# Patient Record
Sex: Female | Born: 1940
Health system: Southern US, Community
[De-identification: ages and names within clinical notes are randomized; demographics above are authoritative.]

## PROBLEM LIST (undated history)

## (undated) ENCOUNTER — Emergency Department (HOSPITAL_COMMUNITY): Admission: EM | Payer: Medicare Other | Source: Home / Self Care

## (undated) DIAGNOSIS — G4733 Obstructive sleep apnea (adult) (pediatric): Secondary | ICD-10-CM

## (undated) DIAGNOSIS — M1990 Unspecified osteoarthritis, unspecified site: Secondary | ICD-10-CM

## (undated) DIAGNOSIS — N183 Chronic kidney disease, stage 3 (moderate): Secondary | ICD-10-CM

## (undated) DIAGNOSIS — D472 Monoclonal gammopathy: Secondary | ICD-10-CM

## (undated) DIAGNOSIS — R7303 Prediabetes: Secondary | ICD-10-CM

## (undated) DIAGNOSIS — R011 Cardiac murmur, unspecified: Secondary | ICD-10-CM

## (undated) DIAGNOSIS — K219 Gastro-esophageal reflux disease without esophagitis: Secondary | ICD-10-CM

## (undated) DIAGNOSIS — K31819 Angiodysplasia of stomach and duodenum without bleeding: Secondary | ICD-10-CM

## (undated) DIAGNOSIS — T7840XA Allergy, unspecified, initial encounter: Secondary | ICD-10-CM

## (undated) DIAGNOSIS — Z1231 Encounter for screening mammogram for malignant neoplasm of breast: Secondary | ICD-10-CM

## (undated) DIAGNOSIS — I4891 Unspecified atrial fibrillation: Secondary | ICD-10-CM

## (undated) DIAGNOSIS — M4802 Spinal stenosis, cervical region: Secondary | ICD-10-CM

## (undated) DIAGNOSIS — H269 Unspecified cataract: Secondary | ICD-10-CM

## (undated) DIAGNOSIS — R079 Chest pain, unspecified: Secondary | ICD-10-CM

## (undated) DIAGNOSIS — N200 Calculus of kidney: Secondary | ICD-10-CM

## (undated) DIAGNOSIS — Z789 Other specified health status: Secondary | ICD-10-CM

## (undated) DIAGNOSIS — G992 Myelopathy in diseases classified elsewhere: Secondary | ICD-10-CM

## (undated) DIAGNOSIS — R7989 Other specified abnormal findings of blood chemistry: Secondary | ICD-10-CM

## (undated) DIAGNOSIS — C801 Malignant (primary) neoplasm, unspecified: Secondary | ICD-10-CM

## (undated) DIAGNOSIS — I5042 Chronic combined systolic (congestive) and diastolic (congestive) heart failure: Secondary | ICD-10-CM

## (undated) DIAGNOSIS — E785 Hyperlipidemia, unspecified: Secondary | ICD-10-CM

## (undated) DIAGNOSIS — L308 Other specified dermatitis: Secondary | ICD-10-CM

## (undated) DIAGNOSIS — I10 Essential (primary) hypertension: Secondary | ICD-10-CM

## (undated) DIAGNOSIS — Z9981 Dependence on supplemental oxygen: Secondary | ICD-10-CM

## (undated) DIAGNOSIS — I872 Venous insufficiency (chronic) (peripheral): Secondary | ICD-10-CM

## (undated) DIAGNOSIS — L97919 Non-pressure chronic ulcer of unspecified part of right lower leg with unspecified severity: Secondary | ICD-10-CM

## (undated) DIAGNOSIS — M8580 Other specified disorders of bone density and structure, unspecified site: Secondary | ICD-10-CM

## (undated) DIAGNOSIS — K635 Polyp of colon: Secondary | ICD-10-CM

## (undated) DIAGNOSIS — I509 Heart failure, unspecified: Secondary | ICD-10-CM

## (undated) DIAGNOSIS — K5150 Left sided colitis without complications: Secondary | ICD-10-CM

## (undated) DIAGNOSIS — J449 Chronic obstructive pulmonary disease, unspecified: Secondary | ICD-10-CM

## (undated) DIAGNOSIS — E559 Vitamin D deficiency, unspecified: Secondary | ICD-10-CM

## (undated) DIAGNOSIS — M542 Cervicalgia: Secondary | ICD-10-CM

## (undated) DIAGNOSIS — N289 Disorder of kidney and ureter, unspecified: Secondary | ICD-10-CM

## (undated) DIAGNOSIS — J45909 Unspecified asthma, uncomplicated: Secondary | ICD-10-CM

## (undated) DIAGNOSIS — K5520 Angiodysplasia of colon without hemorrhage: Secondary | ICD-10-CM

## (undated) DIAGNOSIS — R0902 Hypoxemia: Secondary | ICD-10-CM

## (undated) DIAGNOSIS — I2781 Cor pulmonale (chronic): Secondary | ICD-10-CM

## (undated) DIAGNOSIS — D519 Vitamin B12 deficiency anemia, unspecified: Secondary | ICD-10-CM

## (undated) DIAGNOSIS — Z5189 Encounter for other specified aftercare: Secondary | ICD-10-CM

## (undated) HISTORY — DX: Essential (primary) hypertension: I10

## (undated) HISTORY — DX: Unspecified cataract: H26.9

## (undated) HISTORY — DX: Hyperlipidemia, unspecified: E78.5

## (undated) HISTORY — DX: Disorder of kidney and ureter, unspecified: N28.9

## (undated) HISTORY — DX: Chronic obstructive pulmonary disease, unspecified: J44.9

## (undated) HISTORY — DX: Angiodysplasia of colon without hemorrhage: K55.20

## (undated) HISTORY — DX: Monoclonal gammopathy: D47.2

## (undated) HISTORY — DX: Calculus of kidney: N20.0

## (undated) HISTORY — DX: Heart failure, unspecified: I50.9

## (undated) HISTORY — DX: Other specified dermatitis: L30.8

## (undated) HISTORY — DX: Hypoxemia: R09.02

## (undated) HISTORY — PX: BRONCHOSCOPY: SUR163

## (undated) HISTORY — DX: Gastro-esophageal reflux disease without esophagitis: K21.9

## (undated) HISTORY — DX: Chronic combined systolic (congestive) and diastolic (congestive) heart failure: I50.42

## (undated) HISTORY — DX: Cardiac murmur, unspecified: R01.1

## (undated) HISTORY — DX: Angiodysplasia of stomach and duodenum without bleeding: K31.819

## (undated) HISTORY — DX: Other specified abnormal findings of blood chemistry: R79.89

## (undated) HISTORY — DX: Cor pulmonale (chronic): I27.81

## (undated) HISTORY — DX: Other specified disorders of bone density and structure, unspecified site: M85.80

## (undated) HISTORY — DX: Cervicalgia: M54.2

## (undated) HISTORY — DX: Polyp of colon: K63.5

## (undated) HISTORY — DX: Prediabetes: R73.03

## (undated) HISTORY — DX: Morbid (severe) obesity due to excess calories: E66.01

## (undated) HISTORY — DX: Chronic kidney disease, stage 3 (moderate): N18.3

## (undated) HISTORY — DX: Unspecified osteoarthritis, unspecified site: M19.90

## (undated) HISTORY — DX: Unspecified atrial fibrillation: I48.91

## (undated) HISTORY — DX: Allergy, unspecified, initial encounter: T78.40XA

## (undated) HISTORY — DX: Non-pressure chronic ulcer of unspecified part of right lower leg with unspecified severity: L97.919

## (undated) HISTORY — PX: NO PAST SURGERIES: SHX2092

## (undated) HISTORY — PX: COLONOSCOPY: SHX174

## (undated) HISTORY — DX: Obstructive sleep apnea (adult) (pediatric): G47.33

## (undated) HISTORY — DX: Left sided colitis without complications: K51.50

## (undated) HISTORY — DX: Chest pain, unspecified: R07.9

## (undated) HISTORY — DX: Vitamin B12 deficiency anemia, unspecified: D51.9

## (undated) HISTORY — DX: Unspecified asthma, uncomplicated: J45.909

## (undated) HISTORY — DX: Vitamin D deficiency, unspecified: E55.9

## (undated) HISTORY — DX: Myelopathy in diseases classified elsewhere: G99.2

## (undated) HISTORY — DX: Spinal stenosis, cervical region: M48.02

## (undated) HISTORY — PX: POLYPECTOMY: SHX149

## (undated) HISTORY — DX: Encounter for other specified aftercare: Z51.89

## (undated) HISTORY — DX: Encounter for screening mammogram for malignant neoplasm of breast: Z12.31

## (undated) HISTORY — DX: Venous insufficiency (chronic) (peripheral): I87.2

---

## 1988-07-30 DIAGNOSIS — I10 Essential (primary) hypertension: Secondary | ICD-10-CM

## 1988-07-30 HISTORY — DX: Essential (primary) hypertension: I10

## 1992-07-30 DIAGNOSIS — K219 Gastro-esophageal reflux disease without esophagitis: Secondary | ICD-10-CM

## 1992-07-30 HISTORY — DX: Gastro-esophageal reflux disease without esophagitis: K21.9

## 1996-11-27 DIAGNOSIS — E785 Hyperlipidemia, unspecified: Secondary | ICD-10-CM

## 1996-11-27 HISTORY — DX: Hyperlipidemia, unspecified: E78.5

## 1997-01-21 ENCOUNTER — Encounter: Payer: Self-pay | Admitting: Family Medicine

## 1997-01-21 LAB — CONVERTED CEMR LAB: Pap Smear: NORMAL

## 1998-01-27 DIAGNOSIS — J441 Chronic obstructive pulmonary disease with (acute) exacerbation: Secondary | ICD-10-CM | POA: Insufficient documentation

## 1998-01-27 DIAGNOSIS — J449 Chronic obstructive pulmonary disease, unspecified: Secondary | ICD-10-CM

## 1998-01-27 HISTORY — DX: Chronic obstructive pulmonary disease, unspecified: J44.9

## 2000-04-29 ENCOUNTER — Encounter: Payer: Self-pay | Admitting: Family Medicine

## 2000-04-29 LAB — CONVERTED CEMR LAB: Pap Smear: NORMAL

## 2000-05-07 ENCOUNTER — Other Ambulatory Visit: Admission: RE | Admit: 2000-05-07 | Discharge: 2000-05-07 | Payer: Self-pay | Admitting: Family Medicine

## 2000-07-14 ENCOUNTER — Encounter: Payer: Self-pay | Admitting: Internal Medicine

## 2001-09-07 ENCOUNTER — Emergency Department (HOSPITAL_COMMUNITY): Admission: EM | Admit: 2001-09-07 | Discharge: 2001-09-07 | Payer: Self-pay

## 2001-10-28 ENCOUNTER — Encounter: Payer: Self-pay | Admitting: Family Medicine

## 2001-10-30 ENCOUNTER — Other Ambulatory Visit: Admission: RE | Admit: 2001-10-30 | Discharge: 2001-10-30 | Payer: Self-pay | Admitting: Family Medicine

## 2002-12-14 ENCOUNTER — Encounter: Payer: Self-pay | Admitting: Family Medicine

## 2002-12-14 ENCOUNTER — Other Ambulatory Visit: Admission: RE | Admit: 2002-12-14 | Discharge: 2002-12-14 | Payer: Self-pay | Admitting: Family Medicine

## 2003-11-28 ENCOUNTER — Encounter: Payer: Self-pay | Admitting: Family Medicine

## 2003-12-15 ENCOUNTER — Other Ambulatory Visit: Admission: RE | Admit: 2003-12-15 | Discharge: 2003-12-15 | Payer: Self-pay | Admitting: Family Medicine

## 2004-07-17 ENCOUNTER — Ambulatory Visit: Payer: Self-pay | Admitting: Family Medicine

## 2004-07-21 ENCOUNTER — Ambulatory Visit: Payer: Self-pay | Admitting: Family Medicine

## 2004-07-26 ENCOUNTER — Ambulatory Visit: Payer: Self-pay | Admitting: Family Medicine

## 2004-08-09 ENCOUNTER — Ambulatory Visit: Payer: Self-pay | Admitting: Family Medicine

## 2004-08-17 ENCOUNTER — Ambulatory Visit: Payer: Self-pay | Admitting: Family Medicine

## 2004-08-25 ENCOUNTER — Ambulatory Visit: Payer: Self-pay | Admitting: Family Medicine

## 2004-08-30 ENCOUNTER — Ambulatory Visit: Payer: Self-pay | Admitting: Family Medicine

## 2004-09-04 ENCOUNTER — Ambulatory Visit: Payer: Self-pay | Admitting: Family Medicine

## 2004-09-08 ENCOUNTER — Ambulatory Visit: Payer: Self-pay | Admitting: Family Medicine

## 2004-09-15 ENCOUNTER — Ambulatory Visit: Payer: Self-pay | Admitting: Family Medicine

## 2004-09-21 ENCOUNTER — Ambulatory Visit: Payer: Self-pay | Admitting: Family Medicine

## 2004-09-22 ENCOUNTER — Ambulatory Visit: Payer: Self-pay

## 2004-09-29 ENCOUNTER — Ambulatory Visit: Payer: Self-pay | Admitting: Family Medicine

## 2004-10-30 ENCOUNTER — Ambulatory Visit: Payer: Self-pay | Admitting: Family Medicine

## 2004-11-15 ENCOUNTER — Ambulatory Visit: Payer: Self-pay | Admitting: Internal Medicine

## 2004-11-28 ENCOUNTER — Ambulatory Visit: Payer: Self-pay

## 2004-11-28 ENCOUNTER — Encounter: Payer: Self-pay | Admitting: Internal Medicine

## 2004-12-06 ENCOUNTER — Ambulatory Visit: Payer: Self-pay | Admitting: Family Medicine

## 2005-02-09 ENCOUNTER — Ambulatory Visit: Payer: Self-pay | Admitting: Family Medicine

## 2005-02-26 ENCOUNTER — Ambulatory Visit: Payer: Self-pay | Admitting: Family Medicine

## 2005-03-22 ENCOUNTER — Ambulatory Visit: Payer: Self-pay | Admitting: Family Medicine

## 2005-03-30 ENCOUNTER — Ambulatory Visit: Payer: Self-pay | Admitting: Family Medicine

## 2005-07-03 ENCOUNTER — Ambulatory Visit: Payer: Self-pay | Admitting: Family Medicine

## 2005-07-18 ENCOUNTER — Ambulatory Visit: Payer: Self-pay | Admitting: Family Medicine

## 2005-07-25 ENCOUNTER — Ambulatory Visit: Payer: Self-pay | Admitting: Family Medicine

## 2005-08-30 ENCOUNTER — Encounter: Payer: Self-pay | Admitting: Family Medicine

## 2005-09-13 ENCOUNTER — Ambulatory Visit: Payer: Self-pay | Admitting: Family Medicine

## 2005-09-17 ENCOUNTER — Ambulatory Visit: Payer: Self-pay | Admitting: Family Medicine

## 2005-09-17 ENCOUNTER — Encounter: Payer: Self-pay | Admitting: Family Medicine

## 2005-09-17 ENCOUNTER — Other Ambulatory Visit: Admission: RE | Admit: 2005-09-17 | Discharge: 2005-09-17 | Payer: Self-pay | Admitting: Family Medicine

## 2005-11-07 ENCOUNTER — Ambulatory Visit: Payer: Self-pay | Admitting: Family Medicine

## 2005-11-16 ENCOUNTER — Ambulatory Visit: Payer: Self-pay | Admitting: Family Medicine

## 2006-02-25 ENCOUNTER — Ambulatory Visit: Payer: Self-pay | Admitting: Family Medicine

## 2006-03-01 ENCOUNTER — Ambulatory Visit: Payer: Self-pay | Admitting: Family Medicine

## 2006-03-04 ENCOUNTER — Ambulatory Visit: Payer: Self-pay | Admitting: Family Medicine

## 2006-03-07 ENCOUNTER — Ambulatory Visit: Payer: Self-pay | Admitting: Family Medicine

## 2006-03-11 ENCOUNTER — Ambulatory Visit: Payer: Self-pay | Admitting: Family Medicine

## 2006-03-19 ENCOUNTER — Ambulatory Visit: Payer: Self-pay | Admitting: Internal Medicine

## 2006-04-03 ENCOUNTER — Ambulatory Visit: Payer: Self-pay | Admitting: Family Medicine

## 2006-05-01 ENCOUNTER — Ambulatory Visit: Payer: Self-pay | Admitting: Internal Medicine

## 2006-07-02 ENCOUNTER — Ambulatory Visit: Payer: Self-pay | Admitting: Family Medicine

## 2006-09-17 ENCOUNTER — Ambulatory Visit: Payer: Self-pay | Admitting: Family Medicine

## 2006-09-17 LAB — CONVERTED CEMR LAB
AST: 16 units/L (ref 0–37)
Albumin: 3.6 g/dL (ref 3.5–5.2)
Basophils Absolute: 0 10*3/uL (ref 0.0–0.1)
Bilirubin, Direct: 0.1 mg/dL (ref 0.0–0.3)
Chloride: 103 meq/L (ref 96–112)
Cholesterol: 190 mg/dL (ref 0–200)
Eosinophils Absolute: 0.2 10*3/uL (ref 0.0–0.6)
Eosinophils Relative: 1.7 % (ref 0.0–5.0)
GFR calc Af Amer: 71 mL/min
GFR calc non Af Amer: 59 mL/min
Glucose, Bld: 105 mg/dL — ABNORMAL HIGH (ref 70–99)
HCT: 37.3 % (ref 36.0–46.0)
Lymphocytes Relative: 20.1 % (ref 12.0–46.0)
MCHC: 34 g/dL (ref 30.0–36.0)
MCV: 79.3 fL (ref 78.0–100.0)
Magnesium: 2 mg/dL (ref 1.5–2.5)
Neutro Abs: 7.8 10*3/uL — ABNORMAL HIGH (ref 1.4–7.7)
Neutrophils Relative %: 70.3 % (ref 43.0–77.0)
Platelets: 274 10*3/uL (ref 150–400)
RBC: 4.71 M/uL (ref 3.87–5.11)
Sodium: 141 meq/L (ref 135–145)
TSH: 2.05 microintl units/mL (ref 0.35–5.50)
Total CHOL/HDL Ratio: 4.3
Triglycerides: 134 mg/dL (ref 0–149)
Vitamin B-12: 198 pg/mL — ABNORMAL LOW (ref 211–911)
WBC: 11.1 10*3/uL — ABNORMAL HIGH (ref 4.5–10.5)

## 2006-09-19 ENCOUNTER — Ambulatory Visit: Payer: Self-pay | Admitting: Family Medicine

## 2006-10-14 ENCOUNTER — Ambulatory Visit: Payer: Self-pay | Admitting: Family Medicine

## 2006-11-07 ENCOUNTER — Ambulatory Visit: Payer: Self-pay | Admitting: Family Medicine

## 2006-11-13 ENCOUNTER — Encounter: Payer: Self-pay | Admitting: Family Medicine

## 2006-11-13 DIAGNOSIS — D519 Vitamin B12 deficiency anemia, unspecified: Secondary | ICD-10-CM

## 2006-11-13 DIAGNOSIS — R7303 Prediabetes: Secondary | ICD-10-CM

## 2006-11-13 DIAGNOSIS — R600 Localized edema: Secondary | ICD-10-CM

## 2006-11-13 HISTORY — DX: Vitamin B12 deficiency anemia, unspecified: D51.9

## 2006-11-13 HISTORY — DX: Prediabetes: R73.03

## 2006-12-10 ENCOUNTER — Ambulatory Visit: Payer: Self-pay | Admitting: Family Medicine

## 2006-12-21 ENCOUNTER — Emergency Department (HOSPITAL_COMMUNITY): Admission: EM | Admit: 2006-12-21 | Discharge: 2006-12-22 | Payer: Self-pay | Admitting: Emergency Medicine

## 2007-02-10 ENCOUNTER — Ambulatory Visit: Payer: Self-pay | Admitting: *Deleted

## 2007-02-17 ENCOUNTER — Ambulatory Visit: Payer: Self-pay | Admitting: Internal Medicine

## 2007-02-17 ENCOUNTER — Ambulatory Visit: Payer: Self-pay | Admitting: Family Medicine

## 2007-03-10 ENCOUNTER — Encounter: Payer: Self-pay | Admitting: Family Medicine

## 2007-04-09 ENCOUNTER — Ambulatory Visit: Payer: Self-pay | Admitting: Family Medicine

## 2007-05-21 ENCOUNTER — Ambulatory Visit: Payer: Self-pay | Admitting: Family Medicine

## 2007-08-29 ENCOUNTER — Ambulatory Visit: Payer: Self-pay | Admitting: Family Medicine

## 2007-09-15 ENCOUNTER — Ambulatory Visit: Payer: Self-pay | Admitting: Family Medicine

## 2007-09-19 ENCOUNTER — Ambulatory Visit: Payer: Self-pay | Admitting: Family Medicine

## 2007-09-19 LAB — CONVERTED CEMR LAB
ALT: 16 units/L (ref 0–35)
AST: 15 units/L (ref 0–37)
Alkaline Phosphatase: 78 units/L (ref 39–117)
BUN: 26 mg/dL — ABNORMAL HIGH (ref 6–23)
Basophils Relative: 0 % (ref 0.0–1.0)
CO2: 32 meq/L (ref 19–32)
Calcium: 9.4 mg/dL (ref 8.4–10.5)
Chloride: 103 meq/L (ref 96–112)
Eosinophils Relative: 2.6 % (ref 0.0–5.0)
GFR calc Af Amer: 53 mL/min
GFR calc non Af Amer: 43 mL/min
HDL: 39.5 mg/dL (ref >39.0)
LDL Cholesterol: 111 mg/dL — ABNORMAL HIGH (ref 0–99)
Lymphocytes Relative: 17.9 % (ref 12.0–46.0)
Monocytes Relative: 6.3 % (ref 3.0–11.0)
Neutro Abs: 7.6 10*3/uL (ref 1.4–7.7)
Platelets: 285 10*3/uL (ref 150–400)
RBC: 4.86 M/uL (ref 3.87–5.11)
Total CHOL/HDL Ratio: 4.6
Total Protein: 7.3 g/dL (ref 6.0–8.3)
Triglycerides: 154 mg/dL — ABNORMAL HIGH (ref 0–149)
VLDL: 31 mg/dL (ref 0–40)
WBC: 10.3 10*3/uL (ref 4.5–10.5)

## 2007-09-24 ENCOUNTER — Ambulatory Visit: Payer: Self-pay | Admitting: Family Medicine

## 2007-10-03 ENCOUNTER — Encounter: Payer: Self-pay | Admitting: Family Medicine

## 2007-10-13 ENCOUNTER — Encounter (INDEPENDENT_AMBULATORY_CARE_PROVIDER_SITE_OTHER): Payer: Self-pay | Admitting: *Deleted

## 2007-11-03 ENCOUNTER — Ambulatory Visit: Payer: Self-pay | Admitting: Family Medicine

## 2008-02-02 ENCOUNTER — Other Ambulatory Visit: Admission: RE | Admit: 2008-02-02 | Discharge: 2008-02-02 | Payer: Self-pay | Admitting: Family Medicine

## 2008-02-02 ENCOUNTER — Ambulatory Visit: Payer: Self-pay | Admitting: Family Medicine

## 2008-02-02 ENCOUNTER — Encounter: Payer: Self-pay | Admitting: Family Medicine

## 2008-02-02 DIAGNOSIS — Z860100 Personal history of colon polyps, unspecified: Secondary | ICD-10-CM | POA: Insufficient documentation

## 2008-02-02 LAB — CONVERTED CEMR LAB
Glucose, Urine, Semiquant: NEGATIVE
Pap Smear: NORMAL
Specific Gravity, Urine: 1.02
Urobilinogen, UA: 1
pH: 6

## 2008-02-03 ENCOUNTER — Ambulatory Visit: Payer: Self-pay | Admitting: Gastroenterology

## 2008-02-10 ENCOUNTER — Encounter (INDEPENDENT_AMBULATORY_CARE_PROVIDER_SITE_OTHER): Payer: Self-pay | Admitting: *Deleted

## 2008-02-16 ENCOUNTER — Ambulatory Visit: Payer: Self-pay | Admitting: Gastroenterology

## 2008-02-16 ENCOUNTER — Encounter: Payer: Self-pay | Admitting: Gastroenterology

## 2008-02-16 DIAGNOSIS — K635 Polyp of colon: Secondary | ICD-10-CM

## 2008-02-16 HISTORY — DX: Polyp of colon: K63.5

## 2008-02-18 ENCOUNTER — Encounter: Payer: Self-pay | Admitting: Gastroenterology

## 2008-03-03 ENCOUNTER — Telehealth: Payer: Self-pay | Admitting: Gastroenterology

## 2008-03-19 ENCOUNTER — Ambulatory Visit: Payer: Self-pay | Admitting: Gastroenterology

## 2008-03-19 DIAGNOSIS — K420 Umbilical hernia with obstruction, without gangrene: Secondary | ICD-10-CM

## 2008-03-19 DIAGNOSIS — K5150 Left sided colitis without complications: Secondary | ICD-10-CM | POA: Insufficient documentation

## 2008-03-19 HISTORY — DX: Left sided colitis without complications: K51.50

## 2008-03-19 LAB — CONVERTED CEMR LAB
Basophils Relative: 0.8 % (ref 0.0–3.0)
Eosinophils Relative: 2.7 % (ref 0.0–5.0)
Ferritin: 182.3 ng/mL (ref 10.0–291.0)
Folate: 4.7 ng/mL
HCT: 37.4 % (ref 36.0–46.0)
Hemoglobin: 12.8 g/dL (ref 12.0–15.0)
Monocytes Absolute: 0.9 10*3/uL (ref 0.1–1.0)
Monocytes Relative: 6.8 % (ref 3.0–12.0)
Neutro Abs: 8.2 10*3/uL — ABNORMAL HIGH (ref 1.4–7.7)
Vitamin B-12: 328 pg/mL (ref 211–911)
WBC: 12.6 10*3/uL — ABNORMAL HIGH (ref 4.5–10.5)

## 2008-03-24 ENCOUNTER — Telehealth: Payer: Self-pay | Admitting: Gastroenterology

## 2008-04-01 ENCOUNTER — Telehealth: Payer: Self-pay | Admitting: Gastroenterology

## 2008-04-08 ENCOUNTER — Ambulatory Visit (HOSPITAL_COMMUNITY): Admission: RE | Admit: 2008-04-08 | Discharge: 2008-04-08 | Payer: Self-pay | Admitting: Gastroenterology

## 2008-04-20 ENCOUNTER — Ambulatory Visit: Payer: Self-pay | Admitting: Gastroenterology

## 2008-04-20 DIAGNOSIS — K5732 Diverticulitis of large intestine without perforation or abscess without bleeding: Secondary | ICD-10-CM | POA: Insufficient documentation

## 2008-04-20 LAB — CONVERTED CEMR LAB
Basophils Absolute: 0 10*3/uL (ref 0.0–0.1)
Basophils Relative: 0.4 % (ref 0.0–3.0)
Lymphocytes Relative: 19.8 % (ref 12.0–46.0)
MCHC: 34.5 g/dL (ref 30.0–36.0)
Neutrophils Relative %: 70.1 % (ref 43.0–77.0)
RBC: 4.38 M/uL (ref 3.87–5.11)
WBC: 11.6 10*3/uL — ABNORMAL HIGH (ref 4.5–10.5)

## 2008-04-30 ENCOUNTER — Ambulatory Visit: Payer: Self-pay | Admitting: Family Medicine

## 2008-05-02 LAB — CONVERTED CEMR LAB
BUN: 17 mg/dL (ref 6–23)
Chloride: 102 meq/L (ref 96–112)
GFR calc non Af Amer: 48 mL/min
Potassium: 4.7 meq/L (ref 3.5–5.1)

## 2008-05-04 ENCOUNTER — Ambulatory Visit: Payer: Self-pay | Admitting: Family Medicine

## 2008-05-11 ENCOUNTER — Ambulatory Visit: Payer: Self-pay | Admitting: Family Medicine

## 2008-05-27 ENCOUNTER — Ambulatory Visit: Payer: Self-pay | Admitting: Internal Medicine

## 2008-05-28 ENCOUNTER — Encounter: Payer: Self-pay | Admitting: Internal Medicine

## 2008-06-16 ENCOUNTER — Ambulatory Visit: Payer: Self-pay | Admitting: Internal Medicine

## 2008-09-16 ENCOUNTER — Ambulatory Visit: Payer: Self-pay | Admitting: Endocrinology

## 2008-09-16 ENCOUNTER — Ambulatory Visit: Payer: Self-pay | Admitting: Internal Medicine

## 2008-09-17 ENCOUNTER — Telehealth: Payer: Self-pay | Admitting: Endocrinology

## 2008-09-23 ENCOUNTER — Telehealth: Payer: Self-pay | Admitting: Endocrinology

## 2008-11-16 ENCOUNTER — Telehealth: Payer: Self-pay | Admitting: Internal Medicine

## 2008-11-17 ENCOUNTER — Ambulatory Visit: Payer: Self-pay | Admitting: Internal Medicine

## 2008-11-17 DIAGNOSIS — M722 Plantar fascial fibromatosis: Secondary | ICD-10-CM

## 2008-11-17 LAB — CONVERTED CEMR LAB
Albumin: 3.5 g/dL (ref 3.5–5.2)
Basophils Absolute: 0 10*3/uL (ref 0.0–0.1)
Bilirubin Urine: NEGATIVE
CO2: 33 meq/L — ABNORMAL HIGH (ref 19–32)
Chloride: 97 meq/L (ref 96–112)
Cholesterol, target level: 200 mg/dL
Creatinine,U: 30.3 mg/dL
Direct LDL: 159.1 mg/dL
Eosinophils Absolute: 0.2 10*3/uL (ref 0.0–0.7)
Glucose, Bld: 118 mg/dL — ABNORMAL HIGH (ref 70–99)
HCT: 37.2 % (ref 36.0–46.0)
HDL goal, serum: 40 mg/dL
Hemoglobin, Urine: NEGATIVE
Hemoglobin: 12.5 g/dL (ref 12.0–15.0)
Lymphs Abs: 2.1 10*3/uL (ref 0.7–4.0)
MCHC: 33.7 g/dL (ref 30.0–36.0)
MCV: 80.9 fL (ref 78.0–100.0)
Neutro Abs: 12 10*3/uL — ABNORMAL HIGH (ref 1.4–7.7)
Nitrite: NEGATIVE
Potassium: 3.7 meq/L (ref 3.5–5.1)
RDW: 13.9 % (ref 11.5–14.6)
Sodium: 142 meq/L (ref 135–145)
TSH: 1.78 microintl units/mL (ref 0.35–5.50)
Total Protein: 7.9 g/dL (ref 6.0–8.3)
Triglycerides: 158 mg/dL — ABNORMAL HIGH (ref 0.0–149.0)
Urine Glucose: NEGATIVE mg/dL
Urobilinogen, UA: 0.2 (ref 0.0–1.0)
Vitamin B-12: 238 pg/mL (ref 211–911)

## 2008-12-01 ENCOUNTER — Encounter: Admission: RE | Admit: 2008-12-01 | Discharge: 2008-12-28 | Payer: Self-pay | Admitting: Internal Medicine

## 2008-12-15 ENCOUNTER — Ambulatory Visit: Payer: Self-pay | Admitting: Internal Medicine

## 2008-12-15 DIAGNOSIS — R0602 Shortness of breath: Secondary | ICD-10-CM | POA: Insufficient documentation

## 2008-12-16 ENCOUNTER — Encounter: Payer: Self-pay | Admitting: Internal Medicine

## 2008-12-21 ENCOUNTER — Ambulatory Visit: Payer: Self-pay | Admitting: Internal Medicine

## 2008-12-22 ENCOUNTER — Telehealth: Payer: Self-pay | Admitting: Internal Medicine

## 2009-01-10 ENCOUNTER — Ambulatory Visit: Payer: Self-pay | Admitting: Internal Medicine

## 2009-01-10 DIAGNOSIS — M109 Gout, unspecified: Secondary | ICD-10-CM

## 2009-04-05 ENCOUNTER — Ambulatory Visit: Payer: Self-pay | Admitting: Internal Medicine

## 2009-04-05 LAB — CONVERTED CEMR LAB
ALT: 14 units/L (ref 0–35)
AST: 17 units/L (ref 0–37)
Alkaline Phosphatase: 72 units/L (ref 39–117)
Basophils Relative: 0 % (ref 0.0–3.0)
Bilirubin Urine: NEGATIVE
Bilirubin, Direct: 0.1 mg/dL (ref 0.0–0.3)
Calcium: 9.1 mg/dL (ref 8.4–10.5)
Chloride: 104 meq/L (ref 96–112)
Creatinine, Ser: 1.2 mg/dL (ref 0.4–1.2)
Eosinophils Relative: 2.6 % (ref 0.0–5.0)
Folate: 6.1 ng/mL
GFR calc non Af Amer: 47.38 mL/min (ref >60)
Ketones, ur: NEGATIVE mg/dL
Leukocytes, UA: NEGATIVE
Lymphocytes Relative: 18.1 % (ref 12.0–46.0)
MCV: 82.7 fL (ref 78.0–100.0)
Monocytes Relative: 3.8 % (ref 3.0–12.0)
Neutrophils Relative %: 75.5 % (ref 43.0–77.0)
Platelets: 222 10*3/uL (ref 150.0–400.0)
RBC: 4.46 M/uL (ref 3.87–5.11)
Specific Gravity, Urine: 1.01 (ref 1.000–1.030)
Total Bilirubin: 0.6 mg/dL (ref 0.3–1.2)
Total Protein, Urine: NEGATIVE mg/dL
Total Protein: 7 g/dL (ref 6.0–8.3)
Vitamin B-12: 227 pg/mL (ref 211–911)
WBC: 12.6 10*3/uL — ABNORMAL HIGH (ref 4.5–10.5)
pH: 5 (ref 5.0–8.0)

## 2009-04-06 ENCOUNTER — Telehealth: Payer: Self-pay | Admitting: Internal Medicine

## 2009-04-06 ENCOUNTER — Encounter: Payer: Self-pay | Admitting: Internal Medicine

## 2009-05-24 ENCOUNTER — Ambulatory Visit: Payer: Self-pay | Admitting: Internal Medicine

## 2009-06-02 ENCOUNTER — Encounter: Payer: Self-pay | Admitting: Internal Medicine

## 2009-06-02 ENCOUNTER — Ambulatory Visit: Payer: Self-pay | Admitting: Internal Medicine

## 2009-08-22 ENCOUNTER — Ambulatory Visit: Payer: Self-pay | Admitting: Internal Medicine

## 2009-08-22 LAB — CONVERTED CEMR LAB
Albumin: 3.6 g/dL (ref 3.5–5.2)
Basophils Absolute: 0 10*3/uL (ref 0.0–0.1)
Basophils Relative: 0 % (ref 0.0–3.0)
CO2: 31 meq/L (ref 19–32)
Calcium: 9.3 mg/dL (ref 8.4–10.5)
Chloride: 101 meq/L (ref 96–112)
Cholesterol: 224 mg/dL — ABNORMAL HIGH (ref 0–200)
Creatinine, Ser: 1.1 mg/dL (ref 0.4–1.2)
Direct LDL: 154.6 mg/dL
Eosinophils Absolute: 0.3 10*3/uL (ref 0.0–0.7)
Glucose, Bld: 124 mg/dL — ABNORMAL HIGH (ref 70–99)
Hemoglobin, Urine: NEGATIVE
Hemoglobin: 12.7 g/dL (ref 12.0–15.0)
Leukocytes, UA: NEGATIVE
MCHC: 32.4 g/dL (ref 30.0–36.0)
MCV: 83.7 fL (ref 78.0–100.0)
Monocytes Absolute: 0.7 10*3/uL (ref 0.1–1.0)
Neutro Abs: 9.4 10*3/uL — ABNORMAL HIGH (ref 1.4–7.7)
Nitrite: NEGATIVE
RBC: 4.7 M/uL (ref 3.87–5.11)
RDW: 14.7 % — ABNORMAL HIGH (ref 11.5–14.6)
Sodium: 139 meq/L (ref 135–145)
Specific Gravity, Urine: 1.015 (ref 1.000–1.030)
TSH: 1.39 microintl units/mL (ref 0.35–5.50)
Total CHOL/HDL Ratio: 5
Total Protein: 7.8 g/dL (ref 6.0–8.3)
Triglycerides: 157 mg/dL — ABNORMAL HIGH (ref 0.0–149.0)
Urine Glucose: NEGATIVE mg/dL
Urobilinogen, UA: 0.2 (ref 0.0–1.0)
Vitamin B-12: 207 pg/mL — ABNORMAL LOW (ref 211–911)

## 2010-01-16 ENCOUNTER — Ambulatory Visit: Payer: Self-pay | Admitting: Internal Medicine

## 2010-01-16 DIAGNOSIS — N390 Urinary tract infection, site not specified: Secondary | ICD-10-CM | POA: Insufficient documentation

## 2010-01-16 LAB — CONVERTED CEMR LAB
Ketones, ur: NEGATIVE mg/dL
Leukocytes, UA: NEGATIVE
Specific Gravity, Urine: 1.02 (ref 1.000–1.030)
Total Protein, Urine: NEGATIVE mg/dL
pH: 5.5 (ref 5.0–8.0)

## 2010-02-03 ENCOUNTER — Ambulatory Visit: Payer: Self-pay | Admitting: Internal Medicine

## 2010-02-03 DIAGNOSIS — J441 Chronic obstructive pulmonary disease with (acute) exacerbation: Secondary | ICD-10-CM

## 2010-03-22 ENCOUNTER — Telehealth: Payer: Self-pay | Admitting: Internal Medicine

## 2010-03-22 ENCOUNTER — Ambulatory Visit: Payer: Self-pay | Admitting: Internal Medicine

## 2010-03-22 ENCOUNTER — Encounter: Payer: Self-pay | Admitting: Internal Medicine

## 2010-03-22 DIAGNOSIS — R079 Chest pain, unspecified: Secondary | ICD-10-CM

## 2010-03-22 LAB — CONVERTED CEMR LAB
ALT: 14 units/L (ref 0–35)
Alkaline Phosphatase: 84 units/L (ref 39–117)
Basophils Relative: 0.6 % (ref 0.0–3.0)
Bilirubin, Direct: 0.1 mg/dL (ref 0.0–0.3)
Calcium: 9.3 mg/dL (ref 8.4–10.5)
Chloride: 103 meq/L (ref 96–112)
Creatinine, Ser: 1.1 mg/dL (ref 0.4–1.2)
Eosinophils Relative: 1.1 % (ref 0.0–5.0)
Lymphocytes Relative: 17 % (ref 12.0–46.0)
MCV: 83.6 fL (ref 78.0–100.0)
Neutrophils Relative %: 73.6 % (ref 43.0–77.0)
RBC: 4.46 M/uL (ref 3.87–5.11)
Sodium: 144 meq/L (ref 135–145)
Specific Gravity, Urine: 1.01 (ref 1.000–1.030)
Total Protein, Urine: NEGATIVE mg/dL
Total Protein: 7.1 g/dL (ref 6.0–8.3)
Urine Glucose: NEGATIVE mg/dL
Urobilinogen, UA: 0.2 (ref 0.0–1.0)
WBC: 14.9 10*3/uL — ABNORMAL HIGH (ref 4.5–10.5)

## 2010-03-23 ENCOUNTER — Encounter: Payer: Self-pay | Admitting: Internal Medicine

## 2010-03-24 ENCOUNTER — Telehealth (INDEPENDENT_AMBULATORY_CARE_PROVIDER_SITE_OTHER): Payer: Self-pay | Admitting: *Deleted

## 2010-04-19 ENCOUNTER — Ambulatory Visit: Payer: Self-pay | Admitting: Cardiology

## 2010-04-19 DIAGNOSIS — R0989 Other specified symptoms and signs involving the circulatory and respiratory systems: Secondary | ICD-10-CM

## 2010-04-19 DIAGNOSIS — E669 Obesity, unspecified: Secondary | ICD-10-CM | POA: Insufficient documentation

## 2010-04-19 HISTORY — DX: Morbid (severe) obesity due to excess calories: E66.01

## 2010-04-25 ENCOUNTER — Telehealth (INDEPENDENT_AMBULATORY_CARE_PROVIDER_SITE_OTHER): Payer: Self-pay | Admitting: *Deleted

## 2010-04-26 ENCOUNTER — Encounter: Payer: Self-pay | Admitting: Cardiology

## 2010-04-26 ENCOUNTER — Ambulatory Visit: Payer: Self-pay

## 2010-04-26 ENCOUNTER — Ambulatory Visit: Payer: Self-pay | Admitting: Cardiology

## 2010-04-26 ENCOUNTER — Encounter (HOSPITAL_COMMUNITY): Admission: RE | Admit: 2010-04-26 | Discharge: 2010-05-05 | Payer: Self-pay | Admitting: Cardiology

## 2010-05-25 ENCOUNTER — Ambulatory Visit: Payer: Self-pay | Admitting: Internal Medicine

## 2010-06-06 ENCOUNTER — Ambulatory Visit: Payer: Self-pay | Admitting: Internal Medicine

## 2010-06-06 DIAGNOSIS — L02419 Cutaneous abscess of limb, unspecified: Secondary | ICD-10-CM | POA: Insufficient documentation

## 2010-06-06 DIAGNOSIS — L03119 Cellulitis of unspecified part of limb: Secondary | ICD-10-CM

## 2010-06-09 ENCOUNTER — Inpatient Hospital Stay (HOSPITAL_COMMUNITY): Admission: EM | Admit: 2010-06-09 | Discharge: 2010-06-13 | Payer: Self-pay | Admitting: Emergency Medicine

## 2010-06-09 ENCOUNTER — Telehealth: Payer: Self-pay | Admitting: Internal Medicine

## 2010-06-18 ENCOUNTER — Encounter: Payer: Self-pay | Admitting: Internal Medicine

## 2010-06-20 ENCOUNTER — Ambulatory Visit: Payer: Self-pay | Admitting: Internal Medicine

## 2010-06-20 DIAGNOSIS — N183 Chronic kidney disease, stage 3 (moderate): Secondary | ICD-10-CM

## 2010-06-20 DIAGNOSIS — N179 Acute kidney failure, unspecified: Secondary | ICD-10-CM | POA: Insufficient documentation

## 2010-06-20 DIAGNOSIS — N1832 Chronic kidney disease, stage 3b: Secondary | ICD-10-CM | POA: Insufficient documentation

## 2010-06-20 LAB — CONVERTED CEMR LAB
BUN: 21 mg/dL (ref 6–23)
GFR calc non Af Amer: 49.1 mL/min (ref >60)
Potassium: 4.4 meq/L (ref 3.5–5.1)
Sodium: 137 meq/L (ref 135–145)

## 2010-06-26 ENCOUNTER — Encounter: Payer: Self-pay | Admitting: Internal Medicine

## 2010-06-26 ENCOUNTER — Telehealth: Payer: Self-pay | Admitting: Internal Medicine

## 2010-06-27 ENCOUNTER — Ambulatory Visit: Payer: Self-pay | Admitting: Internal Medicine

## 2010-06-28 ENCOUNTER — Encounter: Payer: Self-pay | Admitting: Internal Medicine

## 2010-06-29 ENCOUNTER — Encounter: Payer: Self-pay | Admitting: Internal Medicine

## 2010-07-04 ENCOUNTER — Ambulatory Visit: Payer: Self-pay | Admitting: Internal Medicine

## 2010-07-27 ENCOUNTER — Encounter: Payer: Self-pay | Admitting: Internal Medicine

## 2010-07-27 ENCOUNTER — Ambulatory Visit
Admission: RE | Admit: 2010-07-27 | Discharge: 2010-07-27 | Payer: Self-pay | Source: Home / Self Care | Attending: Internal Medicine | Admitting: Internal Medicine

## 2010-07-27 ENCOUNTER — Other Ambulatory Visit
Admission: RE | Admit: 2010-07-27 | Discharge: 2010-07-27 | Payer: Self-pay | Source: Home / Self Care | Admitting: Internal Medicine

## 2010-07-27 DIAGNOSIS — N949 Unspecified condition associated with female genital organs and menstrual cycle: Secondary | ICD-10-CM

## 2010-07-27 DIAGNOSIS — R1084 Generalized abdominal pain: Secondary | ICD-10-CM

## 2010-07-27 DIAGNOSIS — B356 Tinea cruris: Secondary | ICD-10-CM

## 2010-07-27 DIAGNOSIS — K5730 Diverticulosis of large intestine without perforation or abscess without bleeding: Secondary | ICD-10-CM | POA: Insufficient documentation

## 2010-07-27 LAB — CONVERTED CEMR LAB
ALT: 13 units/L (ref 0–35)
Albumin: 3.6 g/dL (ref 3.5–5.2)
Basophils Relative: 0.3 % (ref 0.0–3.0)
Bilirubin Urine: NEGATIVE
CO2: 31 meq/L (ref 19–32)
Chloride: 96 meq/L (ref 96–112)
Eosinophils Absolute: 0.5 10*3/uL (ref 0.0–0.7)
HCT: 39.3 % (ref 36.0–46.0)
Hemoglobin: 13.2 g/dL (ref 12.0–15.0)
Ketones, ur: NEGATIVE mg/dL
Leukocytes, UA: NEGATIVE
Lipase: 31 units/L (ref 11.0–59.0)
MCHC: 33.7 g/dL (ref 30.0–36.0)
MCV: 81.4 fL (ref 78.0–100.0)
Monocytes Absolute: 0.8 10*3/uL (ref 0.1–1.0)
Neutro Abs: 9.2 10*3/uL — ABNORMAL HIGH (ref 1.4–7.7)
Nitrite: NEGATIVE
Potassium: 4 meq/L (ref 3.5–5.1)
RBC: 4.83 M/uL (ref 3.87–5.11)
Sodium: 136 meq/L (ref 135–145)
Total Protein, Urine: NEGATIVE mg/dL
Total Protein: 7.4 g/dL (ref 6.0–8.3)

## 2010-07-28 ENCOUNTER — Telehealth: Payer: Self-pay | Admitting: Internal Medicine

## 2010-08-10 ENCOUNTER — Ambulatory Visit
Admission: RE | Admit: 2010-08-10 | Discharge: 2010-08-10 | Payer: Self-pay | Source: Home / Self Care | Attending: Internal Medicine | Admitting: Internal Medicine

## 2010-08-29 NOTE — Assessment & Plan Note (Signed)
Summary: COUGH-BREATHING DIFFICULT X 1 WK-L SIDE PAIN-STC   Vital Signs:  Patient profile:   70 year old female Height:      63 inches Weight:      221 pounds BMI:     39.29 O2 Sat:      98 % on Room air Temp:     98.4 degrees F oral Pulse rate:   75 / minute Pulse rhythm:   regular Resp:     16 per minute BP sitting:   134 / 70  (left arm) Cuff size:   large  Vitals Entered By: Estell Harpin CMA (January 16, 2010 1:43 PM)  Nutrition Counseling: Patient's BMI is greater than 25 and therefore counseled on weight management options.  O2 Flow:  Room air  Primary Care Provider:  [name] MD  CC:  URI symptoms.  History of Present Illness:  URI Symptoms      This is a 70 year old woman who presents with URI symptoms.  The symptoms began 1 week ago.  The severity is described as moderate.  The patient reports nasal congestion, clear nasal discharge, and dry cough, but denies purulent nasal discharge, sore throat, productive cough, earache, and sick contacts.  Associated symptoms include wheezing.  The patient denies fever, stiff neck, dyspnea, rash, vomiting, diarrhea, use of an antipyretic, and response to antipyretic.  The patient also reports sneezing.  The patient denies response to antihistamine, headache, muscle aches, and severe fatigue.  The patient denies the following risk factors for Strep sinusitis: unilateral facial pain, unilateral nasal discharge, poor response to decongestant, double sickening, tooth pain, Strep exposure, tender adenopathy, and absence of cough.    Preventive Screening-Counseling & Management  Alcohol-Tobacco     Alcohol drinks/day: 0     Smoking Status: quit     Packs/Day: 1998 15 pyh     Year Quit: 08/1996     Pack years: 30+     Passive Smoke Exposure: no  Hep-HIV-STD-Contraception     Hepatitis Risk: no risk noted     HIV Risk: no risk noted     STD Risk: no risk noted      Drug Use:  no.    Medications Prior to Update: 1)  Lasix 40  Mg Tabs (Furosemide) .... Take 1 1/2 Tablets By Mouth Once A Day 2)  Aspirin Low Strength 81 Mg Chew (Aspirin) .... Take 1 Tablet By Mouth Once A Day 3)  Benefiber  Powd (Wheat Dextrin) .... Use in Cereal in Morning 4)  Allopurinol 100 Mg Tabs (Allopurinol) .... One By Mouth Once Daily For Gout 5)  Azor 5-40 Mg Tabs (Amlodipine-Olmesartan) .... One By Mouth Once Daily For High Blood Pressure  Current Medications (verified): 1)  Lasix 40 Mg Tabs (Furosemide) .... Take 1 1/2 Tablets By Mouth Once A Day 2)  Aspirin Low Strength 81 Mg Chew (Aspirin) .... Take 1 Tablet By Mouth Once A Day 3)  Benefiber  Powd (Wheat Dextrin) .... Use in Cereal in Morning 4)  Allopurinol 100 Mg Tabs (Allopurinol) .... One By Mouth Once Daily For Gout 5)  Azor 5-40 Mg Tabs (Amlodipine-Olmesartan) .... One By Mouth Once Daily For High Blood Pressure 6)  Budesonide 0.5 Mg/50m Susp (Budesonide) 7)  Spiriva Handihaler 18 Mcg Caps (Tiotropium Bromide Monohydrate) .... One Puff Once Daily  Allergies (verified): 1)  ! Codeine Sulfate (Codeine Sulfate) 2)  ! Amoxicillin (Amoxicillin) 3)  ! Hydrocodone-Acetaminophen (Hydrocodone-Acetaminophen) 4)  ! Beta Blockers 5)  !  Prednisone 6)  ! Hydrochlorothiazide  Past History:  Past Medical History: Last updated: 01/10/2009 Hypertension (07/30/1988) GERD (07/30/1992) COPD (01/27/1998) Hyperlipidemia (11/27/1996) Congestive heart failure (08/31/2003) Gout  Past Surgical History: Last updated: 11/17/2008 [place]  CT Chest nml 04/1998 Colonosc polyp rectosig ?IBS 11/1993 Abd U/S Bil Sludge 10/92 ECHO EF nml  Tr TR mild LVH 12/1999 Colonoscopy int hemms no polyps 12/24/2001         24yr ECHO no change EF nml 12/17/2003 Adenosine Myoview nml EF 71% 11/29/2004 LE U/S nml  Carotid U/S nml  11/28/2004 Colonoscopy Polyp Diverticulosis (Dr PSharlett Iles 02/16/2008    3 yrs No surgeries  Family History: Last updated: 03/19/2008 Father dec 56  Stroke Mother dec 65 CVA Ca DM Brother dec  Brother dec  Brother dec Brother A RF at 6[employer] [place] DM Sister A Cerv CA CATH Stent Sister A Lung Ca, s/p lobectomy, mets to brain, radiation, recurr to lungs Sister A Mother had kidney cancer Family History of Stomach Cancer: Mother (mets from kidneys) No FH of Colon Cancer: Family History of Clotting disorder: Sister Family History of Colitis/Crohn's: Brother (colitis) Family History of Heart Disease: Mother, Brother x 2, sister  Social History: Last updated: 03/19/2008 Occupation: [employer] Married 3 children Former Smoker-1998 stopped Alcohol use-no Drug use-no Daily Caffeine Use-4 cups daily Patient does not get regular exercise.   Risk Factors: Alcohol Use: 0 (01/16/2010) Caffeine Use: 1 (09/24/2007) Exercise: no (03/19/2008)  Risk Factors: Smoking Status: quit (01/16/2010) Packs/Day: 1998 15 pyh (01/16/2010) Passive Smoke Exposure: no (01/16/2010)  Family History: Reviewed history from 03/19/2008 and no changes required. Father dec 56 Stroke Mother dec 65 CVA Ca DM Brother dec  Brother dec  Brother dec Brother A RF at 6[employer] [place] DM Sister A Cerv CA CATH Stent Sister A Lung Ca, s/p lobectomy, mets to brain, radiation, recurr to lungs Sister A Mother had kidney cancer Family History of Stomach Cancer: Mother (mets from kidneys) No FH of Colon Cancer: Family History of Clotting disorder: Sister Family History of Colitis/Crohn's: Brother (colitis) Family History of Heart Disease: Mother, Brother x 2, sister  Social History: Reviewed history from 03/19/2008 and no changes required. Occupation: [employer] Married 3 children Former Smoker-1998 stopped Alcohol use-no Drug use-no Daily Caffeine Use-4 cups daily Patient does not get regular exercise.   Review of Systems  The patient denies anorexia, fever, weight loss, chest pain, syncope, dyspnea on exertion,  peripheral edema, headaches, hemoptysis, abdominal pain, suspicious skin lesions, transient blindness, enlarged lymph nodes, and angioedema.   GU:  Complains of dysuria; denies abnormal vaginal bleeding, decreased libido, discharge, hematuria, incontinence, nocturia, urinary frequency, and urinary hesitancy.  Physical Exam  General:  alert, well-developed, well-nourished, well-hydrated, appropriate dress, normal appearance, healthy-appearing, cooperative to examination, and good hygiene.  overweight-appearing.   Head:  normocephalic and atraumatic.   Mouth:  Oral mucosa and oropharynx without lesions or exudates.  Teeth in good repair. Neck:  supple, full ROM, no masses, no thyromegaly, no JVD, and no cervical lymphadenopathy.   Lungs:  She has rare exp. wheeze and rhonchi. There is clearing with cough. She has good air movement with no rales. Heart:  normal rate, regular rhythm, no murmur, and no gallop.   Abdomen:  soft, non-tender, normal bowel sounds, no distention, no masses, no hepatomegaly, and no splenomegaly.   Msk:  normal ROM, no joint tenderness, no joint swelling, and no joint warmth.   Pulses:  R and L carotid,radial,femoral,dorsalis pedis and posterior tibial pulses are full and equal bilaterally Extremities:  trace left pedal edema and trace right pedal edema.   Neurologic:  No cranial nerve deficits noted. Station and gait are normal. Plantar reflexes are down-going bilaterally. DTRs are symmetrical throughout. Sensory, motor and coordinative functions appear intact. Skin:  turgor normal, color normal, no rashes, no suspicious lesions, no ecchymoses, and no petechiae.   Cervical Nodes:  No lymphadenopathy noted Psych:  Cognition and judgment appear intact. Alert and cooperative with normal attention span and concentration. No apparent delusions, illusions, hallucinations   Impression & Recommendations:  Problem # 1:  UTI (ICD-599.0) Assessment New  Orders: TLB-Udip w/  Micro (81001-URINE)  Encouraged to push clear liquids, get enough rest, and take acetaminophen as needed. To be seen in 10 days if no improvement, sooner if worse.  Problem # 2:  COPD (ICD-496) Assessment: Deteriorated try depo-medrol IM Her updated medication list for this problem includes:    Budesonide 0.5 Mg/75m Susp (Budesonide)    Spiriva Handihaler 18 Mcg Caps (Tiotropium bromide monohydrate) ..... One puff once daily  Orders: Prescription Created Electronically [phone]  Problem # 3:  HYPERTENSION (ICD-401.9) Assessment: Improved  Her updated medication list for this problem includes:    Lasix 40 Mg Tabs (Furosemide) ..Marland Kitchen.. Take 1 1/2 tablets by mouth once a day    Azor 5-40 Mg Tabs (Amlodipine-olmesartan) ..... One by mouth once daily for high blood pressure  BP today: 134/70 Prior BP: 130/84 (08/22/2009)  Prior 10 Yr Risk Heart Disease: 11 % (04/05/2009)  Labs Reviewed: K+: 4.4 (08/22/2009) Creat: : 1.1 (08/22/2009)   Chol: 224 (08/22/2009)   HDL: 46.20 (08/22/2009)   LDL: 111 (09/19/2007)   TG: 157.0 (08/22/2009)  Complete Medication List: 1)  Lasix 40 Mg Tabs (Furosemide) .... Take 1 1/2 tablets by mouth once a day 2)  Aspirin Low Strength 81 Mg Chew (Aspirin) .... Take 1 tablet by mouth once a day 3)  Benefiber Powd (Wheat dextrin) .... Use in cereal in morning 4)  Allopurinol 100 Mg Tabs (Allopurinol) .... One by mouth once daily for gout 5)  Azor 5-40 Mg Tabs (Amlodipine-olmesartan) .... One by mouth once daily for high blood pressure 6)  Budesonide 0.5 Mg/273mSusp (Budesonide) 7)  Spiriva Handihaler 18 Mcg Caps (Tiotropium bromide monohydrate) .... One puff once daily  Other Orders: Vit B12 1000 mcg (J3420) Admin of Therapeutic Inj  intramuscular or subcutaneous ([phone] Patient Instructions: 1)  Please schedule a follow-up appointment in 2 weeks. 2)  Check your Blood Pressure regularly. If it is above 130/80: you should make an  appointment. Prescriptions: SPIRIVA HANDIHALER 18 MCG CAPS (TIOTROPIUM BROMIDE MONOHYDRATE) One puff once daily  #30 x 11   Entered and Authorized by:   ThJanith LimaD   Signed by:   ThJanith LimaD on 01/16/2010   Method used:   Electronically to        [phone](retail)       [address]     [place]  [id number]     Ph: [phone]     Fax: [phone] RxID:   [id number]  Medication Administration  Injection # 1:    Medication: Depo- Medrol 8070m  Route: IM    Site: LUOQ gluteus    Exp Date: 10/2012  Lot #: [id number]    Mfr: pfizer    Patient tolerated injection without complications    Given by: Estell Harpin CMA (January 16, 2010 2:30 PM)  Injection # 2:    Medication: Depo- Medrol 5m    Route: IM    Site: LUOQ gluteus    Exp Date: 10/2012    Lot #: [id number]    Mfr: pfizer    Patient tolerated injection without complications    Given by: LEstell HarpinCMA (January 16, 2010 2:30 PM)  Injection # 3:    Medication: Vit B12 1000 mcg    Diagnosis: B12 DEFICIENCY (ICD-266.2)    Route: IM    Site: R deltoid    Exp Date: 08/2011    Lot #: [id number]    Mfr: [employer]    Patient tolerated injection without complications    Given by: LEstell HarpinCMA (January 16, 2010 2:30 PM)  Orders Added: 1)  Prescription Created Electronically [G8553] 2)  Vit B12 1000 mcg [J3420] 3)  Admin of Therapeutic Inj  intramuscular or subcutaneous [[id number]] 4)  TLB-Udip w/ Micro [81001-URINE] 5)  Est. Patient Level IV [[[id number]]

## 2010-08-29 NOTE — Letter (Signed)
Summary: Out of Work  [facility]   [place], [place] [address]   Phone: [phone]  Fax: [phone]    July 04, 2010   Employee:  ZAMERIA VOGL Picha    To Whom It May Concern:   For Medical reasons, please excuse the above named employee from work for the following dates:  Start:   06/06/10  End:   07/10/10  If you need additional information, please feel free to contact our office.         Sincerely,    [name] MD

## 2010-08-29 NOTE — Letter (Signed)
Summary: Lipid Letter  [facility]   [place], [place] [address]   Phone: [phone]  Fax: [phone]    08/22/2009  Lynzi Meulemans [address] [place], [place]  [address]  Dear Ms. Schimming:  We have carefully reviewed your last lipid profile from 09/19/2007 and the results are noted below with a summary of recommendations for lipid management.    Cholesterol:       224     Goal: <200   HDL "good" Cholesterol:   46.20     Goal: >40   LDL "bad" Cholesterol:   111     Goal: <130   Triglycerides:       157.0     Goal: <150        TLC Diet (Therapeutic Lifestyle Change): Saturated Fats & Transfatty acids should be kept < 7% of total calories ***Reduce Saturated Fats Polyunstaurated Fat can be up to 10% of total calories Monounsaturated Fat Fat can be up to 20% of total calories Total Fat should be no greater than 25-35% of total calories Carbohydrates should be 50-60% of total calories Protein should be approximately 15% of total calories Fiber should be at least 20-30 grams a day ***Increased fiber may help lower LDL Total Cholesterol should be < 261m/day Consider adding plant stanol/sterols to diet (example: Benacol spread) ***A higher intake of unsaturated fat may reduce Triglycerides and Increase HDL    Adjunctive Measures (may lower LIPIDS and reduce risk of Heart Attack) include: Aerobic Exercise (20-30 minutes 3-4 times a week) Limit Alcohol Consumption Weight Reduction Aspirin 75-81 mg a day by mouth (if not allergic or contraindicated) Dietary Fiber 20-30 grams a day by mouth     Current Medications: 1)    Lasix 40 Mg Tabs (Furosemide) .... Take 1 1/2 tablets by mouth once a day 2)    Aspirin Low Strength 81 Mg Chew (Aspirin) .... Take 1 tablet by mouth once a day 3)    Benefiber  Powd (Wheat dextrin) .... Use in cereal in morning 4)    Allopurinol 100 Mg Tabs (Allopurinol) .... One by mouth once daily for gout 5)    Azor 5-40 Mg  Tabs (Amlodipine-olmesartan) .... One by mouth once daily for high blood pressure  If you have any questions, please call. We appreciate being able to work with you.   Sincerely,    [facility] TJanith LimaMD

## 2010-08-29 NOTE — Assessment & Plan Note (Signed)
Summary: FU / REFILLS /NWS  #   Vital Signs:  Patient profile:   70 year old female Height:      63 inches Weight:      216 pounds BMI:     38.40 O2 Sat:      94 % on Room air Temp:     97.6 degrees F oral Pulse rate:   84 / minute Pulse rhythm:   regular Resp:     16 per minute BP sitting:   130 / 84  (left arm) Cuff size:   large  Vitals Entered By: Estell Harpin CMA (August 22, 2009 9:30 AM)  Nutrition Counseling: Patient's BMI is greater than 25 and therefore counseled on weight management options.  O2 Flow:  Room air CC: follow-up visit, Hypertension Management   Primary Care Provider:  [name] MD  CC:  follow-up visit and Hypertension Management.  History of Present Illness: She returns for f/up and feels well, no complaints today.  Hypertension History:      She denies headache, chest pain, palpitations, dyspnea with exertion, orthopnea, PND, peripheral edema, neurologic problems, syncope, and side effects from treatment.  She notes no problems with any antihypertensive medication side effects.        Positive major cardiovascular risk factors include female age 37 years old or older, hyperlipidemia, and hypertension.  Negative major cardiovascular risk factors include no history of diabetes, negative family history for ischemic heart disease, and non-tobacco-user status.        Positive history for target organ damage include cardiac end organ damage (either CHF or LVH).  Further assessment for target organ damage reveals no history of ASHD, stroke/TIA, peripheral vascular disease, renal insufficiency, or hypertensive retinopathy.     Preventive Screening-Counseling & Management  Alcohol-Tobacco     Alcohol drinks/day: 0     Smoking Status: quit     Packs/Day: 1998 15 pyh     Year Quit: 08/1996     Pack years: 30+     Passive Smoke Exposure: no  Hep-HIV-STD-Contraception     Hepatitis Risk: no risk noted     HIV Risk: no risk noted     STD Risk: no  risk noted      Drug Use:  no.    Current Medications (verified): 1)  Lasix 40 Mg Tabs (Furosemide) .... Take 1 1/2 Tablets By Mouth Once A Day 2)  Aspirin Low Strength 81 Mg Chew (Aspirin) .... Take 1 Tablet By Mouth Once A Day 3)  Benefiber  Powd (Wheat Dextrin) .... Use in Cereal in Morning 4)  Allopurinol 100 Mg Tabs (Allopurinol) .... One By Mouth Once Daily For Gout 5)  Azor 5-40 Mg Tabs (Amlodipine-Olmesartan) .... One By Mouth Once Daily For High Blood Pressure  Allergies (verified): 1)  ! Codeine Sulfate (Codeine Sulfate) 2)  ! Amoxicillin (Amoxicillin) 3)  ! Hydrocodone-Acetaminophen (Hydrocodone-Acetaminophen) 4)  ! Beta Blockers 5)  ! Prednisone 6)  ! Hydrochlorothiazide  Past History:  Past Medical History: Reviewed history from 01/10/2009 and no changes required. Hypertension (07/30/1988) GERD (07/30/1992) COPD (01/27/1998) Hyperlipidemia (11/27/1996) Congestive heart failure (08/31/2003) Gout  Past Surgical History: Reviewed history from 11/17/2008 and no changes required. [place]  CT Chest nml 04/1998 Colonosc polyp rectosig ?IBS 11/1993 Abd U/S Bil Sludge 10/92 ECHO EF nml  Tr TR mild LVH 12/1999 Colonoscopy int hemms no polyps 12/24/2001         51yr ECHO no change EF nml 12/17/2003  Adenosine Myoview nml EF 71% 11/29/2004 LE U/S nml  Carotid U/S nml  11/28/2004 Colonoscopy Polyp Diverticulosis (Dr Sharlett Iles) 02/16/2008    3 yrs No surgeries  Family History: Reviewed history from 03/19/2008 and no changes required. Father dec 56 Stroke Mother dec 65 CVA Ca DM Brother dec  Brother dec  Brother dec Brother A RF at [employer] dec MI DM Sister A Cerv CA CATH Stent Sister A Lung Ca, s/p lobectomy, mets to brain, radiation, recurr to lungs Sister A Mother had kidney cancer Family History of Stomach Cancer: Mother (mets from kidneys) No FH of Colon Cancer: Family History of Clotting disorder: Sister Family History of  Colitis/Crohn's: Brother (colitis) Family History of Heart Disease: Mother, Brother x 2, sister  Social History: Reviewed history from 03/19/2008 and no changes required. Occupation: [employer] Married 3 children Former Smoker-1998 stopped Alcohol use-no Drug use-no Daily Caffeine Use-4 cups daily Patient does not get regular exercise.  Hepatitis Risk:  no risk noted HIV Risk:  no risk noted STD Risk:  no risk noted  Review of Systems  The patient denies anorexia, fever, weight loss, weight gain, abdominal pain, hematuria, difficulty walking, depression, enlarged lymph nodes, and prolonged cough.   MS:  Denies joint pain, joint redness, joint swelling, loss of strength, mid back pain, muscle aches, muscle weakness, and stiffness. Endo:  Denies cold intolerance, excessive hunger, excessive thirst, excessive urination, polyuria, and weight change.  Physical Exam  General:  alert, well-developed, well-nourished, well-hydrated, appropriate dress, normal appearance, healthy-appearing, cooperative to examination, and good hygiene.  overweight-appearing.   Head:  normocephalic and atraumatic.   Mouth:  Oral mucosa and oropharynx without lesions or exudates.  Teeth in good repair. Neck:  supple, full ROM, no masses, no thyromegaly, no JVD, and no cervical lymphadenopathy.   Lungs:  She has rare exp. wheeze and rhonchi. There is clearing with cough. She has good air movement with no rales. Heart:  normal rate, regular rhythm, no murmur, and no gallop.   Abdomen:  soft, non-tender, normal bowel sounds, no distention, no masses, no hepatomegaly, and no splenomegaly.   Msk:  normal ROM, no joint tenderness, no joint swelling, and no joint warmth.   Pulses:  R and L carotid,radial,femoral,dorsalis pedis and posterior tibial pulses are full and equal bilaterally Extremities:  trace left pedal edema and trace right pedal edema.   Neurologic:  No cranial nerve deficits noted. Station  and gait are normal. Plantar reflexes are down-going bilaterally. DTRs are symmetrical throughout. Sensory, motor and coordinative functions appear intact. Skin:  turgor normal, color normal, no rashes, no suspicious lesions, no ecchymoses, and no petechiae.   Cervical Nodes:  No lymphadenopathy noted Psych:  Cognition and judgment appear intact. Alert and cooperative with normal attention span and concentration. No apparent delusions, illusions, hallucinations   Impression & Recommendations:  Problem # 1:  GOUT (ICD-274.9) Assessment Improved  The following medications were removed from the medication list:    Colchicine 0.6 Mg Tabs (Colchicine) ..... One by mouth two times a day for gout Her updated medication list for this problem includes:    Allopurinol 100 Mg Tabs (Allopurinol) ..... One by mouth once daily for gout  Orders: Venipuncture (07680) TLB-B12 + Folate Pnl (88110_31594-V85/FYT) TLB-Lipid Panel (80061-LIPID) TLB-BMP (Basic Metabolic Panel-BMET) (24462-MMNOTRR) TLB-CBC Platelet - w/Differential (85025-CBCD) TLB-Hepatic/Liver Function Pnl (80076-HEPATIC) TLB-TSH (Thyroid Stimulating Hormone) (84443-TSH) TLB-Udip w/ Micro (81001-URINE) TLB-A1C / Hgb A1C (Glycohemoglobin) (83036-A1C)  Elevate extremity; warm compresses, symptomatic relief and medication as  directed.   Problem # 2:  HYPERGLYCEMIA (ICD-790.6) Assessment: Unchanged  Orders: Venipuncture (54270) TLB-B12 + Folate Pnl (62376_28315-V76/HYW) TLB-Lipid Panel (80061-LIPID) TLB-BMP (Basic Metabolic Panel-BMET) (73710-GYIRSWN) TLB-CBC Platelet - w/Differential (85025-CBCD) TLB-Hepatic/Liver Function Pnl (80076-HEPATIC) TLB-TSH (Thyroid Stimulating Hormone) (84443-TSH) TLB-Udip w/ Micro (81001-URINE) TLB-A1C / Hgb A1C (Glycohemoglobin) (83036-A1C)  Problem # 3:  HYPERLIPIDEMIA (ICD-272.4) Assessment: Unchanged  Orders: Venipuncture (46270) TLB-B12 + Folate Pnl (35009_38182-X93/ZJI) TLB-Lipid Panel  (80061-LIPID) TLB-BMP (Basic Metabolic Panel-BMET) (96789-FYBOFBP) TLB-CBC Platelet - w/Differential (85025-CBCD) TLB-Hepatic/Liver Function Pnl (80076-HEPATIC) TLB-TSH (Thyroid Stimulating Hormone) (84443-TSH) TLB-Udip w/ Micro (81001-URINE) TLB-A1C / Hgb A1C (Glycohemoglobin) (83036-A1C)  Labs Reviewed: SGOT: 17 (04/05/2009)   SGPT: 14 (04/05/2009)  Lipid Goals: Chol Goal: 200 (11/17/2008)   HDL Goal: 40 (11/17/2008)   LDL Goal: 130 (11/17/2008)   TG Goal: 150 (11/17/2008)  Prior 10 Yr Risk Heart Disease: 11 % (04/05/2009)   HDL:45.60 (11/17/2008), 39.5 (09/19/2007)  LDL:111 (09/19/2007), 119 (09/17/2006)  Chol:233 (11/17/2008), 181 (09/19/2007)  Trig:158.0 (11/17/2008), 154 (09/19/2007)  Problem # 4:  HYPERTENSION (ICD-401.9) Assessment: Improved  The following medications were removed from the medication list:    Amlodipine Besylate 5 Mg Tabs (Amlodipine besylate) .Marland Kitchen... 1 tab every hs    Benicar 40 Mg Tabs (Olmesartan medoxomil) .Marland Kitchen... 1 daily by mouth Her updated medication list for this problem includes:    Lasix 40 Mg Tabs (Furosemide) .Marland Kitchen... Take 1 1/2 tablets by mouth once a day    Azor 5-40 Mg Tabs (Amlodipine-olmesartan) ..... One by mouth once daily for high blood pressure  Orders: Venipuncture (10258) TLB-B12 + Folate Pnl (52778_24235-T61/WER) TLB-Lipid Panel (80061-LIPID) TLB-BMP (Basic Metabolic Panel-BMET) (15400-QQPYPPJ) TLB-CBC Platelet - w/Differential (85025-CBCD) TLB-Hepatic/Liver Function Pnl (80076-HEPATIC) TLB-TSH (Thyroid Stimulating Hormone) (84443-TSH) TLB-Udip w/ Micro (81001-URINE) TLB-A1C / Hgb A1C (Glycohemoglobin) (83036-A1C)  BP today: 130/84 Prior BP: 144/70 (06/02/2009)  Prior 10 Yr Risk Heart Disease: 11 % (04/05/2009)  Labs Reviewed: K+: 4.4 (04/05/2009) Creat: : 1.2 (04/05/2009)   Chol: 233 (11/17/2008)   HDL: 45.60 (11/17/2008)   LDL: 111 (09/19/2007)   TG: 158.0 (11/17/2008)  Complete Medication List: 1)  Lasix 40 Mg Tabs  (Furosemide) .... Take 1 1/2 tablets by mouth once a day 2)  Aspirin Low Strength 81 Mg Chew (Aspirin) .... Take 1 tablet by mouth once a day 3)  Benefiber Powd (Wheat dextrin) .... Use in cereal in morning 4)  Allopurinol 100 Mg Tabs (Allopurinol) .... One by mouth once daily for gout 5)  Azor 5-40 Mg Tabs (Amlodipine-olmesartan) .... One by mouth once daily for high blood pressure  Hypertension Assessment/Plan:      The patient's hypertensive risk group is category C: Target organ damage and/or diabetes.  Her calculated 10 year risk of coronary heart disease is 11 %.  Today's blood pressure is 130/84.  Her blood pressure goal is < 140/90.  Patient Instructions: 1)  Please schedule a follow-up appointment in 4 months. 2)  It is important that you exercise regularly at least 20 minutes 5 times a week. If you develop chest pain, have severe difficulty breathing, or feel very tired , stop exercising immediately and seek medical attention. 3)  You need to lose weight. Consider a lower calorie diet and regular exercise.  4)  Check your Blood Pressure regularly. If it is above 130/80: you should make an appointment. Prescriptions: AZOR 5-40 MG TABS (AMLODIPINE-OLMESARTAN) One by mouth once daily for high blood pressure  #140 x 0   Entered and Authorized by:   [name] MD  Signed by:   [name] MD on 08/22/2009   Method used:   Samples Given   RxID:   [id number]

## 2010-08-29 NOTE — Progress Notes (Signed)
Summary: PICC line  Phone Note From Other Clinic   Caller: [facility]- [name] [phone] Summary of Call: Nurse called stating that she is scheduled to go out to patient home today and would like MD advisment regarding her PICC line removal. Patient had completed IV antibiotics.Ellison Hughs Archie CMA  June 26, 2010 9:46 AM  Initial call taken by: [name] MD,  June 26, 2010 10:56 AM  Follow-up for Phone Call        yes PICC line can be pulled Follow-up by: [name] MD,  June 26, 2010 10:56 AM  Additional Follow-up for Phone Call Additional follow up Details #1::        Returned call to nurse (lmovm per her request) givingMD advisement.Ellison Hughs Archie CMA  June 26, 2010 11:56 AM

## 2010-08-29 NOTE — Miscellaneous (Signed)
Summary: Face to face encounter/[facility]  Face to face encounter/[facility]   Imported By: Phillis Knack 07/03/2010 11:20:30  _____________________________________________________________________  External Attachment:    Type:   Image     Comment:   External Document

## 2010-08-29 NOTE — Assessment & Plan Note (Signed)
Summary: edema left leg/#/cd   Vital Signs:  Patient profile:   70 year old female Menstrual status:  postmenopausal Height:      63 inches Weight:      224.75 pounds BMI:     39.96 O2 Sat:      93 % on Room air Temp:     98.5 degrees F oral Pulse rate:   89 / minute Pulse rhythm:   regular Resp:     16 per minute BP sitting:   142 / 70  (left arm) Cuff size:   large  Vitals Entered By: Estell Harpin CMA (June 06, 2010 9:55 AM)  Nutrition Counseling: Patient's BMI is greater than 25 and therefore counseled on weight management options.  O2 Flow:  Room air CC: Patient c/o continued Left leg swelling w/ yellow drainage and pain, Hypertension Management  Does patient need assistance? Functional Status Self care Ambulation Normal   Primary Care Provider:  [name] MD  CC:  Patient c/o continued Left leg swelling w/ yellow drainage and pain and Hypertension Management.  History of Present Illness: She returns for f/up and complains that her left lower leg has become more swollen and painful and is draining a foul-smelling exudate.  Hypertension History:      She complains of peripheral edema, but denies headache, chest pain, palpitations, dyspnea with exertion, orthopnea, PND, visual symptoms, neurologic problems, syncope, and side effects from treatment.  She notes no problems with any antihypertensive medication side effects.        Positive major cardiovascular risk factors include female age 32 years old or older, hyperlipidemia, and hypertension.  Negative major cardiovascular risk factors include no history of diabetes, negative family history for ischemic heart disease, and non-tobacco-user status.        Positive history for target organ damage include cardiac end organ damage (either CHF or LVH).  Further assessment for target organ damage reveals no history of ASHD, stroke/TIA, peripheral vascular disease, renal insufficiency, or hypertensive retinopathy.      Current Medications (verified): 1)  Aspirin Low Strength 81 Mg Chew (Aspirin) .... Take 1 Tablet By Mouth Once A Day 2)  Benefiber  Powd (Wheat Dextrin) .... Use in Cereal in Morning 3)  Allopurinol 100 Mg Tabs (Allopurinol) .... One By Mouth Once Daily For Gout 4)  Spiriva Handihaler 18 Mcg Caps (Tiotropium Bromide Monohydrate) .... One Puff Once Daily 5)  Diovan Hct 160-25 Mg Tabs (Valsartan-Hydrochlorothiazide) .... One By Mouth Once Daily For High Blood Pressure  Allergies (verified): 1)  ! Codeine Sulfate (Codeine Sulfate) 2)  ! Amoxicillin (Amoxicillin) 3)  ! Hydrocodone-Acetaminophen (Hydrocodone-Acetaminophen) 4)  ! Beta Blockers 5)  ! Prednisone 6)  ! Hydrochlorothiazide 7)  ! Amlodipine Besylate  Past History:  Past Medical History: Last updated: 01/10/2009 Hypertension (07/30/1988) GERD (07/30/1992) COPD (01/27/1998) Hyperlipidemia (11/27/1996) Congestive heart failure (08/31/2003) Gout  Past Surgical History: Last updated: 11/17/2008 [place]  CT Chest nml 04/1998 Colonosc polyp rectosig ?IBS 11/1993 Abd U/S Bil Sludge 10/92 ECHO EF nml  Tr TR mild LVH 12/1999 Colonoscopy int hemms no polyps 12/24/2001         26yr ECHO no change EF nml 12/17/2003 Adenosine Myoview nml EF 71% 11/29/2004 LE U/S nml  Carotid U/S nml  11/28/2004 Colonoscopy Polyp Diverticulosis (Dr PSharlett Iles 02/16/2008    3 yrs No surgeries  Family History: Last updated: 03/19/2008 Father dec 56 Stroke Mother dec 65 CVA Ca DM Brother dec  Brother  dec  Brother dec Brother A RF at [employer] dec [place] DM Sister A Cerv CA CATH Stent Sister A Lung Ca, s/p lobectomy, mets to brain, radiation, recurr to lungs Sister A Mother had kidney cancer Family History of Stomach Cancer: Mother (mets from kidneys) No FH of Colon Cancer: Family History of Clotting disorder: Sister Family History of Colitis/Crohn's: Brother (colitis) Family History of Heart Disease: Mother, Brother  x 2, sister  Social History: Last updated: 03/19/2008 Occupation: [employer] Married 3 children Former Smoker-1998 stopped Alcohol use-no Drug use-no Daily Caffeine Use-4 cups daily Patient does not get regular exercise.   Risk Factors: Alcohol Use: 0 (05/25/2010) Caffeine Use: 1 (09/24/2007) Exercise: no (03/19/2008)  Risk Factors: Smoking Status: quit (05/25/2010) Packs/Day: 1998 15 pyh (05/25/2010) Passive Smoke Exposure: no (05/25/2010)  Family History: Reviewed history from 03/19/2008 and no changes required. Father dec 56 Stroke Mother dec 65 CVA Ca DM Brother dec  Brother dec  Brother dec Brother A RF at [employer] dec [place] DM Sister A Cerv CA CATH Stent Sister A Lung Ca, s/p lobectomy, mets to brain, radiation, recurr to lungs Sister A Mother had kidney cancer Family History of Stomach Cancer: Mother (mets from kidneys) No FH of Colon Cancer: Family History of Clotting disorder: Sister Family History of Colitis/Crohn's: Brother (colitis) Family History of Heart Disease: Mother, Brother x 2, sister  Social History: Reviewed history from 03/19/2008 and no changes required. Occupation: [employer] Married 3 children Former Smoker-1998 stopped Alcohol use-no Drug use-no Daily Caffeine Use-4 cups daily Patient does not get regular exercise.   Review of Systems  The patient denies anorexia, fever, weight loss, weight gain, chest pain, abdominal pain, and enlarged lymph nodes.   General:  Denies chills, fatigue, fever, loss of appetite, malaise, sleep disorder, and sweats.  Physical Exam  General:  alert, well-developed, well-nourished, well-hydrated, appropriate dress, normal appearance, healthy-appearing, cooperative to examination, and overweight-appearing.   Head:  normocephalic, atraumatic, no abnormalities observed, and no abnormalities palpated.   Mouth:  good dentition, no gingival abnormalities, no dental plaque, pharynx pink  and moist, no erythema, no exudates, and no posterior lymphoid hypertrophy.   Neck:  supple, full ROM, no masses, no thyromegaly, no thyroid nodules or tenderness, no JVD, normal carotid upstroke, no carotid bruits, no cervical lymphadenopathy, and no neck tenderness.   Lungs:  normal respiratory effort, no intercostal retractions, no accessory muscle use, normal breath sounds, no dullness, no fremitus, no wheezes, R base crackles, and L base crackles.   Heart:  normal rate, regular rhythm, no murmur, no gallop, no rub, and no JVD.   Abdomen:  soft, non-tender, normal bowel sounds, no distention, no masses, no guarding, no rigidity, no rebound tenderness, no abdominal hernia, no inguinal hernia, no hepatomegaly, and no splenomegaly.   Msk:  left anterior lower leg has wet, brawny induration, with weeping, erythema, and foul-smelling scant exudate but no induration, streaking, fluctuance, or abscess formation. Pulses:  R and L carotid,radial,femoral,dorsalis pedis and posterior tibial pulses are full and equal bilaterally Extremities:  left pretibial edema and right pretibial edema.   Neurologic:  No cranial nerve deficits noted. Station and gait are normal. Plantar reflexes are down-going bilaterally. DTRs are symmetrical throughout. Sensory, motor and coordinative functions appear intact. Skin:  Intact without suspicious lesions or rashes Cervical Nodes:  No lymphadenopathy noted Psych:  Cognition and judgment appear intact. Alert and cooperative with normal attention span and concentration. No apparent delusions, illusions, hallucinations   Impression &  Recommendations:  Problem # 1:  CELLULITIS, LEFT LEG (ICD-682.6) Assessment New  Her updated medication list for this problem includes:    Ceftin 500 Mg Tab (Cefuroxime axetil) .Marland Kitchen... Take one (1) tablet by mouth two (2) times a day x 10 days  Orders: T-Culture, Wound (87070/[id number]) Home Health Referral [facility]) for an [facility]  boot  Elevate affected area. Warm moist compresses for 20 minutes every 2 hours while awake. Take antibiotics as directed and take acetaminophen as needed. To be seen in 48-72 hours if no improvement, sooner if worse.  Problem # 2:  EDEMA (ICD-782.3) Assessment: Unchanged  Her updated medication list for this problem includes:    Diovan Hct 160-25 Mg Tabs (Valsartan-hydrochlorothiazide) ..... One by mouth once daily for high blood pressure  Orders: Home Health Referral (Home Health)  Complete Medication List: 1)  Aspirin Low Strength 81 Mg Chew (Aspirin) .... Take 1 tablet by mouth once a day 2)  Benefiber Powd (Wheat dextrin) .... Use in cereal in morning 3)  Allopurinol 100 Mg Tabs (Allopurinol) .... One by mouth once daily for gout 4)  Spiriva Handihaler 18 Mcg Caps (Tiotropium bromide monohydrate) .... One puff once daily 5)  Diovan Hct 160-25 Mg Tabs (Valsartan-hydrochlorothiazide) .... One by mouth once daily for high blood pressure 6)  Ceftin 500 Mg Tab (Cefuroxime axetil) .... Take one (1) tablet by mouth two (2) times a day x 10 days 7)  Tramadol Hcl 50 Mg Tabs (Tramadol hcl) .Marland Kitchen.. 1-2 by mouth qid as needed for pain  Hypertension Assessment/Plan:      The patient's hypertensive risk group is category C: Target organ damage and/or diabetes.  Her calculated 10 year risk of coronary heart disease is 15 %.  Today's blood pressure is 142/70.  Her blood pressure goal is < 140/90.  Patient Instructions: 1)  Please schedule a follow-up appointment in 2 weeks. 2)  Take your antibiotic as prescribed until ALL of it is gone, but stop if you develop a rash or swelling and contact our office as soon as possible. 3)  Check your Blood Pressure regularly. If it is above 140/90: you should make an appointment. Prescriptions: TRAMADOL HCL 50 MG TABS (TRAMADOL HCL) 1-2 by mouth QID as needed for pain  #75 x 1   Entered and Authorized by:   [name] MD   Signed by:   [name] MD on  06/06/2010   Method used:   Electronically to        [facility] [address] [phone]* (retail)       [address], [place]  [address]       Ph: [phone] or [phone]       Fax: [phone]   RxID:   [id number] CEFTIN 500 MG TAB (CEFUROXIME AXETIL) Take one (1) tablet by mouth two (2) times a day X 10 days  #28 x 1   Entered and Authorized by:   [name] MD   Signed by:   [name] MD on 06/06/2010   Method used:   Electronically to        [facility] [address] [phone]* (retail)       [address]       [place], [place]  [address]       Ph: [phone] or [phone]  Fax: [phone]   RxID:   [id number]    Orders Added: 1)  T-Culture, Wound [87070/[id number]] 2)  Home Health Referral [facility]] 3)  Est. Patient Level IV [77034]

## 2010-08-29 NOTE — Assessment & Plan Note (Signed)
Summary: ASTHMA/NWS  #   Vital Signs:  Patient profile:   70 year old female Height:      63 inches Weight:      215 pounds BMI:     38.22 O2 Sat:      98 % on Room air Temp:     98.0 degrees F oral Pulse rate:   74 / minute BP sitting:   136 / 78  (left arm) Cuff size:   large  Vitals Entered By: Charlynne Cousins CMA (February 03, 2010 8:58 AM)  O2 Flow:  Room air CC: pt here with c/o coughing with mucous production x several weeks/ ab   Primary Care Provider:  [name] MD  CC:  pt here with c/o coughing with mucous production x several weeks/ ab.  History of Present Illness: here wtih 4 wks intermittent cough, worse in the past 4 days with fever, ST, more prod cough greenish sputum, and today with onset increased wheezing and mild sob.  Pt denies CP,  orthopnea, pnd, worsening LE edema, palps, dizziness or syncope .  Pt denies new neuro symptoms such as headache, facial or extremity weakness   Problems Prior to Update: 1)  Bronchitis-acute  (ICD-466.0) 2)  Chronic Obstructive Pulmonary Disease, Acute Exacerbation  (ICD-491.21) 3)  Uti  (ICD-599.0) 4)  Gout  (ICD-274.9) 5)  Dyspnea  (ICD-786.05) 6)  Fasciitis, Plantar  (ICD-728.71) 7)  Diverticulitis, Colon  (ICD-562.11) 8)  Ulcerative Colitis-left Side  (ZJQ-734.1) 9)  Umbilical Hernia With Obstruction  (ICD-552.1) 10)  Health Maintenance Exam  (ICD-V70.0) 11)  Colonic Polyps, Adenomatous, Hx of  (ICD-V12.72) 12)  Hyperglycemia  (ICD-790.6) 13)  B12 Deficiency  (ICD-266.2) 14)  Ankle Edema, Chronic  (ICD-782.3) 15)  Congestive Heart Failure  (ICD-428.0) 16)  Hyperlipidemia  (ICD-272.4) 17)  COPD  (ICD-496) 18)  Gerd  (ICD-530.81) 19)  Irritable Bowel Syndrome (PATTERSON)  (ICD-564.1) 20)  Hypertension  (ICD-401.9)  Medications Prior to Update: 1)  Lasix 40 Mg Tabs (Furosemide) .... Take 1 1/2 Tablets By Mouth Once A Day 2)  Aspirin Low Strength 81 Mg Chew (Aspirin) .... Take 1 Tablet By Mouth Once A Day 3)   Benefiber  Powd (Wheat Dextrin) .... Use in Cereal in Morning 4)  Allopurinol 100 Mg Tabs (Allopurinol) .... One By Mouth Once Daily For Gout 5)  Azor 5-40 Mg Tabs (Amlodipine-Olmesartan) .... One By Mouth Once Daily For High Blood Pressure 6)  Budesonide 0.5 Mg/2m Susp (Budesonide) 7)  Spiriva Handihaler 18 Mcg Caps (Tiotropium Bromide Monohydrate) .... One Puff Once Daily  Current Medications (verified): 1)  Lasix 40 Mg Tabs (Furosemide) .... Take 1 1/2 Tablets By Mouth Once A Day 2)  Aspirin Low Strength 81 Mg Chew (Aspirin) .... Take 1 Tablet By Mouth Once A Day 3)  Benefiber  Powd (Wheat Dextrin) .... Use in Cereal in Morning 4)  Allopurinol 100 Mg Tabs (Allopurinol) .... One By Mouth Once Daily For Gout 5)  Azor 5-40 Mg Tabs (Amlodipine-Olmesartan) .... One By Mouth Once Daily For High Blood Pressure 6)  Budesonide 0.5 Mg/222mSusp (Budesonide) 7)  Spiriva Handihaler 18 Mcg Caps (Tiotropium Bromide Monohydrate) .... One Puff Once Daily 8)  Azithromycin 250 Mg Tabs (Azithromycin) .... 2po Qd For 1 Day, Then 1po Qd For 4days, Then Stop 9)  Prednisone 10 Mg Tabs (Prednisone) .... 3po Qd For 3days, Then 2po Qd For 3days, Then 1po Qd For 3days, Then Stop 10)  Tessalon Perles 100 Mg Caps (Benzonatate) ...Marland KitchenMarland KitchenMarland Kitchen  1-2 By Mouth Three Times A Day As Needed Cough  Allergies (verified): 1)  ! Codeine Sulfate (Codeine Sulfate) 2)  ! Amoxicillin (Amoxicillin) 3)  ! Hydrocodone-Acetaminophen (Hydrocodone-Acetaminophen) 4)  ! Beta Blockers 5)  ! Prednisone 6)  ! Hydrochlorothiazide  Past History:  Past Medical History: Last updated: 01/10/2009 Hypertension (07/30/1988) GERD (07/30/1992) COPD (01/27/1998) Hyperlipidemia (11/27/1996) Congestive heart failure (08/31/2003) Gout  Past Surgical History: Last updated: 11/17/2008 [place]  CT Chest nml 04/1998 Colonosc polyp rectosig ?IBS 11/1993 Abd U/S Bil Sludge 10/92 ECHO EF nml  Tr TR mild LVH 12/1999 Colonoscopy int  hemms no polyps 12/24/2001         35yr ECHO no change EF nml 12/17/2003 Adenosine Myoview nml EF 71% 11/29/2004 LE U/S nml  Carotid U/S nml  11/28/2004 Colonoscopy Polyp Diverticulosis (Dr PSharlett Iles 02/16/2008    3 yrs No surgeries  Social History: Last updated: 03/19/2008 Occupation: [employer] Married 3 children Former Smoker-1998 stopped Alcohol use-no Drug use-no Daily Caffeine Use-4 cups daily Patient does not get regular exercise.   Risk Factors: Alcohol Use: 0 (01/16/2010) Caffeine Use: 1 (09/24/2007) Exercise: no (03/19/2008)  Risk Factors: Smoking Status: quit (01/16/2010) Packs/Day: 1998 15 pyh (01/16/2010) Passive Smoke Exposure: no (01/16/2010)  Review of Systems       all otherwise negative per pt -    Physical Exam  General:  alert and overweight-appearing.  , mild ill  Head:  normocephalic and atraumatic.   Eyes:  vision grossly intact, pupils equal, and pupils round.   Ears:  bilat tm's red, sinus nontender Nose:  nasal dischargemucosal pallor and mucosal edema.   Mouth:  pharyngeal erythema and fair dentition.   Neck:  supple and no masses.   Lungs:  normal respiratory effort, R decreased breath sounds, R wheezes, L decreased breath sounds, and L wheezes.   Heart:  normal rate and regular rhythm.   Extremities:  no edema, no erythema    Impression & Recommendations:  Problem # 1:  BRONCHITIS-ACUTE (ICD-466.0)  Her updated medication list for this problem includes:    Budesonide 0.5 Mg/258mSusp (Budesonide)    Spiriva Handihaler 18 Mcg Caps (Tiotropium bromide monohydrate) ..... One puff once daily    Azithromycin 250 Mg Tabs (Azithromycin) ...Marland Kitchen. 2po qd for 1 day, then 1po qd for 4days, then stop    Tessalon Perles 100 Mg Caps (Benzonatate) ...Marland Kitchen. 1-2 by mouth three times a day as needed cough treat as above, f/u any worsening signs or symptoms   Orders: Admin of Therapeutic Inj  intramuscular or subcutaneous ([id number]- Medrol 4053m(J1030) Depo- Medrol 55m6m1040)  Problem # 2:  CHRONIC OBSTRUCTIVE PULMONARY DISEASE, ACUTE EXACERBATION (ICD-491.21) for depo IM today, as well as pred pack for home  Problem # 3:  HYPERTENSION (ICD-401.9)  Her updated medication list for this problem includes:    Lasix 40 Mg Tabs (Furosemide) .....Marland KitchenTake 1 1/2 tablets by mouth once a day    Azor 5-40 Mg Tabs (Amlodipine-olmesartan) ..... One by mouth once daily for high blood pressure  BP today: 136/78 Prior BP: 134/70 (01/16/2010)  Prior 10 Yr Risk Heart Disease: 11 % (04/05/2009) stable overall by hx and exam, ok to continue meds/tx as is  Labs Reviewed: K+: 4.4 (08/22/2009) Creat: : 1.1 (08/22/2009)   Chol: 224 (08/22/2009)   HDL: 46.20 (08/22/2009)   LDL: 111 (09/19/2007)   TG: 157.0 (08/22/2009) stable overall by hx and exam, ok to continue meds/tx as is  Complete Medication List: 1)  Lasix 40 Mg Tabs (Furosemide) .... Take 1 1/2 tablets by mouth once a day 2)  Aspirin Low Strength 81 Mg Chew (Aspirin) .... Take 1 tablet by mouth once a day 3)  Benefiber Powd (Wheat dextrin) .... Use in cereal in morning 4)  Allopurinol 100 Mg Tabs (Allopurinol) .... One by mouth once daily for gout 5)  Azor 5-40 Mg Tabs (Amlodipine-olmesartan) .... One by mouth once daily for high blood pressure 6)  Budesonide 0.5 Mg/38m Susp (Budesonide) 7)  Spiriva Handihaler 18 Mcg Caps (Tiotropium bromide monohydrate) .... One puff once daily 8)  Azithromycin 250 Mg Tabs (Azithromycin) .... 2po qd for 1 day, then 1po qd for 4days, then stop 9)  Prednisone 10 Mg Tabs (Prednisone) .... 3po qd for 3days, then 2po qd for 3days, then 1po qd for 3days, then stop 10)  Tessalon Perles 100 Mg Caps (Benzonatate) ..Marland Kitchen. 1-2 by mouth three times a day as needed cough  Patient Instructions: 1)  you had the steroid shot today 2)  Please take all new medications as prescribed  - the antibiotic, prednisone and cough pills 3)  Continue all previous medications as  before this visit  4)  Please schedule an appointment with your primary doctor as needed Prescriptions: TESSALON PERLES 100 MG CAPS (BENZONATATE) 1-2 by mouth three times a day as needed cough  #60 x 1   Entered and Authorized by:   JBiagio BorgMD   Signed by:   JBiagio BorgMD on 02/03/2010   Method used:   Print then Give to Patient   RxID:   [id number] 10 MG TABS (PREDNISONE) 3po qd for 3days, then 2po qd for 3days, then 1po qd for 3days, then stop  #18 x 0   Entered and Authorized by:   JBiagio BorgMD   Signed by:   JBiagio BorgMD on 02/03/2010   Method used:   Print then Give to Patient   RxID:   [id number] 250 MG TABS (AZITHROMYCIN) 2po qd for 1 day, then 1po qd for 4days, then stop  #6 x 1   Entered and Authorized by:   JBiagio BorgMD   Signed by:   JBiagio BorgMD on 02/03/2010   Method used:   Print then Give to Patient   RxID:   [id number]   Medication Administration  Injection # 1:    Medication: Depo- Medrol 827m   Diagnosis: BRONCHITIS-ACUTE (ICD-466.0)    Route: IM    Site: LUOQ gluteus    Exp Date: 10/2012    Lot #: [id number]    Mfr: [employer]    Patient tolerated injection without complications    Given by: Ami Bullins CMA (February 03, 2010 9:48 AM)  Injection # 2:    Medication: Depo- Medrol 4048m  Diagnosis: BRONCHITIS-ACUTE (ICD-466.0)    Route: IM    Site: LUOQ gluteus    Exp Date: 10/2012    Lot #: [id number]    Mfr: [employer]    Patient tolerated injection without complications    Given by: Ami Bullins CMA (February 03, 2010 9:49 AM)  Orders Added: 1)  Admin of Therapeutic Inj  intramuscular or subcutaneous [96372] 2)  Depo- Medrol 6m59m1030] 3)  Depo- Medrol 80mg53m040] 4)  Est. Patient Level IV [[id number]]

## 2010-08-29 NOTE — Progress Notes (Signed)
  Phone Note Call from Patient Call back at [facility] Phone [phone]   Caller: Patient Call For: Dr Ronnald Ramp Summary of Call: Pt seen earlier today, requesting AZOR samples, she forget to ask. Please advise. Initial call taken by: Denice Paradise,  March 22, 2010 2:01 PM  Follow-up for Phone Call        ok Follow-up by: [name] MD,  March 22, 2010 2:03 PM  Additional Follow-up for Phone Call Additional follow up Details #1::        Patient notified and will pick up.Marland KitchenMarland KitchenMarland KitchenEllison Hughs Archie CMA  March 22, 2010 2:18 PM

## 2010-08-29 NOTE — Assessment & Plan Note (Signed)
Summary: follow up-lb   Vital Signs:  Patient profile:   70 year old female Height:      63 inches Weight:      222 pounds BMI:     39.47 O2 Sat:      94 % on Room air Temp:     98.1 degrees F oral Pulse rate:   81 / minute Pulse rhythm:   regular Resp:     16 per minute BP sitting:   144 / 70  (left arm) Cuff size:   large  Vitals Entered By: Estell Harpin CMA (March 22, 2010 9:41 AM)  Nutrition Counseling: Patient's BMI is greater than 25 and therefore counseled on weight management options.  O2 Flow:  Room air CC: follow-up visit// pt c/o SOB w activity, occ cough and asthma Is Patient Diabetic? No Pain Assessment Patient in pain? no       Does patient need assistance? Functional Status Self care Ambulation Normal   Primary Care Provider:  [name] MD  CC:  follow-up visit// pt c/o SOB w activity and occ cough and asthma.  History of Present Illness:  Follow-Up Visit      This is a 70 year old woman who presents for Follow-up visit.  The patient complains of edema, SOB, DOE, PND, orthopnea, and  chest pain,  but denies palpitations, dizziness, and syncope.  The patient reports taking meds as prescribed, monitoring BP, monitoring blood sugars, and dietary noncompliance.  When questioned about possible medication side effects, the patient notes none.    Preventive Screening-Counseling & Management  Alcohol-Tobacco     Alcohol drinks/day: 0     Smoking Status: quit     Packs/Day: 1998 15 pyh     Year Quit: 08/1996     Pack years: 30+     Passive Smoke Exposure: no     Tobacco Counseling: to remain off tobacco products  Hep-HIV-STD-Contraception     Hepatitis Risk: no risk noted     HIV Risk: no risk noted     STD Risk: no risk noted  Medications Prior to Update: 1)  Lasix 40 Mg Tabs (Furosemide) .... Take 1 1/2 Tablets By Mouth Once A Day 2)  Aspirin Low Strength 81 Mg Chew (Aspirin) .... Take 1 Tablet By Mouth Once A Day 3)  Benefiber  Powd  (Wheat Dextrin) .... Use in Cereal in Morning 4)  Allopurinol 100 Mg Tabs (Allopurinol) .... One By Mouth Once Daily For Gout 5)  Azor 5-40 Mg Tabs (Amlodipine-Olmesartan) .... One By Mouth Once Daily For High Blood Pressure 6)  Budesonide 0.5 Mg/26m Susp (Budesonide) 7)  Spiriva Handihaler 18 Mcg Caps (Tiotropium Bromide Monohydrate) .... One Puff Once Daily 8)  Azithromycin 250 Mg Tabs (Azithromycin) .... 2po Qd For 1 Day, Then 1po Qd For 4days, Then Stop 9)  Prednisone 10 Mg Tabs (Prednisone) .... 3po Qd For 3days, Then 2po Qd For 3days, Then 1po Qd For 3days, Then Stop 10)  Tessalon Perles 100 Mg Caps (Benzonatate) ..Marland Kitchen. 1-2 By Mouth Three Times A Day As Needed Cough  Current Medications (verified): 1)  Lasix 40 Mg Tabs (Furosemide) .... Take 1 1/2 Tablets By Mouth Once A Day 2)  Aspirin Low Strength 81 Mg Chew (Aspirin) .... Take 1 Tablet By Mouth Once A Day 3)  Benefiber  Powd (Wheat Dextrin) .... Use in Cereal in Morning 4)  Allopurinol 100 Mg Tabs (Allopurinol) .... One By Mouth Once Daily For Gout 5)  Azor 5-40  Mg Tabs (Amlodipine-Olmesartan) .... One By Mouth Once Daily For High Blood Pressure 6)  Budesonide 0.5 Mg/76m Susp (Budesonide) 7)  Spiriva Handihaler 18 Mcg Caps (Tiotropium Bromide Monohydrate) .... One Puff Once Daily  Allergies (verified): 1)  ! Codeine Sulfate (Codeine Sulfate) 2)  ! Amoxicillin (Amoxicillin) 3)  ! Hydrocodone-Acetaminophen (Hydrocodone-Acetaminophen) 4)  ! Beta Blockers 5)  ! Prednisone 6)  ! Hydrochlorothiazide  Past History:  Past Medical History: Last updated: 01/10/2009 Hypertension (07/30/1988) GERD (07/30/1992) COPD (01/27/1998) Hyperlipidemia (11/27/1996) Congestive heart failure (08/31/2003) Gout  Past Surgical History: Last updated[place] Chest nml 04/1998 Colonosc polyp rectosig ?IBS 11/1993 Abd U/S Bil Sludge 10/92 ECHO EF nml  Tr TR mild LVH 12/1999 Colonoscopy int hemms no polyps  12/24/2001         575yrECHO no change EF nml 12/17/2003 Adenosine Myoview nml EF 71% 11/29/2004 LE U/S nml  Carotid U/S nml  11/28/2004 Colonoscopy Polyp Diverticulosis (Dr PaSharlett Iles7/20/2009    3 yrs No surgeries  Family History: Last updated: 03/19/2008 Father dec 56 Stroke Mother dec 65 CVA Ca DM Brother dec  Brother dec  Brother dec Brother A RF at 6[employer] [place] DM Sister A Cerv CA CATH Stent Sister A Lung Ca, s/p lobectomy, mets to brain, radiation, recurr to lungs Sister A Mother had kidney cancer Family History of Stomach Cancer: Mother (mets from kidneys) No FH of Colon Cancer: Family History of Clotting disorder: Sister Family History of Colitis/Crohn's: Brother (colitis) Family History of Heart Disease: Mother, Brother x 2, sister  Social History: Last updated: 03/19/2008 Occupation: [employer] Married 3 children Former Smoker-1998 stopped Alcohol use-no Drug use-no Daily Caffeine Use-4 cups daily Patient does not get regular exercise.   Risk Factors: Alcohol Use: 0 (03/22/2010) Caffeine Use: 1 (09/24/2007) Exercise: no (03/19/2008)  Risk Factors: Smoking Status: quit (03/22/2010) Packs/Day: 1998 15 pyh (03/22/2010) Passive Smoke Exposure: no (03/22/2010)  Family History: Reviewed history from 03/19/2008 and no changes required. Father dec 56 Stroke Mother dec 65 CVA Ca DM Brother dec  Brother dec  Brother dec Brother A RF at 5[employer] [place] DM Sister A Cerv CA CATH Stent Sister A Lung Ca, s/p lobectomy, mets to brain, radiation, recurr to lungs Sister A Mother had kidney cancer Family History of Stomach Cancer: Mother (mets from kidneys) No FH of Colon Cancer: Family History of Clotting disorder: Sister Family History of Colitis/Crohn's: Brother (colitis) Family History of Heart Disease: Mother, Brother x 2, sister  Social History: Reviewed history from 03/19/2008 and no changes required. Occupation: [employer] Married 3 children Former Smoker-1998 stopped Alcohol use-no Drug use-no Daily Caffeine Use-4 cups daily Patient does not get regular exercise.   Review of Systems       The patient complains of weight gain and peripheral edema.  The patient denies anorexia, fever, weight loss, chest pain, syncope, hemoptysis, abdominal pain, hematuria, suspicious skin lesions, enlarged lymph nodes, and angioedema.   Resp:  Complains of chest pain with inspiration, cough, shortness of breath, and wheezing; denies chest discomfort, coughing up blood, excessive snoring, hypersomnolence, morning headaches, pleuritic, and sputum productive. MS:  Denies joint pain, joint redness, joint swelling, loss of strength, low back pain, muscle aches, muscle weakness, and thoracic pain. Endo:  Denies cold intolerance, excessive hunger, excessive thirst, excessive urination, heat intolerance, polyuria, and weight change.  Physical Exam  General:  alert, well-developed, well-nourished, well-hydrated, appropriate dress, normal appearance, healthy-appearing, cooperative  to examination, and overweight-appearing.   Head:  normocephalic, atraumatic, no abnormalities observed, and no abnormalities palpated.   Eyes:  vision grossly intact, pupils equal, pupils round, and pupils reactive to light.   Mouth:  good dentition, no gingival abnormalities, no dental plaque, pharynx pink and moist, no erythema, no exudates, and no posterior lymphoid hypertrophy.   Neck:  supple, full ROM, no masses, no thyromegaly, no thyroid nodules or tenderness, no JVD, normal carotid upstroke, no carotid bruits, no cervical lymphadenopathy, and no neck tenderness.   Lungs:  normal respiratory effort, no intercostal retractions, no accessory muscle use, normal breath sounds, no dullness, no fremitus, no wheezes, R base crackles, and L base crackles.   Heart:  normal rate, regular rhythm, no murmur, no gallop, no rub, and no JVD.   Abdomen:   soft, non-tender, normal bowel sounds, no distention, no masses, no guarding, no rigidity, no rebound tenderness, no abdominal hernia, no inguinal hernia, no hepatomegaly, and no splenomegaly.   Msk:  normal ROM, no joint tenderness, no joint swelling, no joint warmth, no redness over joints, no joint deformities, no joint instability, and no crepitation.   Pulses:  R and L carotid,radial,femoral,dorsalis pedis and posterior tibial pulses are full and equal bilaterally Extremities:  1+ left pedal edema and 1+ right pedal edema.   Neurologic:  No cranial nerve deficits noted. Station and gait are normal. Plantar reflexes are down-going bilaterally. DTRs are symmetrical throughout. Sensory, motor and coordinative functions appear intact. Skin:  turgor normal, color normal, no rashes, no suspicious lesions, no ecchymoses, and no petechiae.   Cervical Nodes:  No lymphadenopathy noted Psych:  Cognition and judgment appear intact. Alert and cooperative with normal attention span and concentration. No apparent delusions, illusions, hallucinations Additional Exam:  EKG is normal.   Impression & Recommendations:  Problem # 1:  EDEMA (ICD-782.3) Assessment Unchanged  Her updated medication list for this problem includes:    Lasix 40 Mg Tabs (Furosemide) .Marland Kitchen... Take 1 1/2 tablets by mouth once a day  Orders: TLB-CBC Platelet - w/Differential (85025-CBCD) TLB-Hepatic/Liver Function Pnl (80076-HEPATIC) TLB-TSH (Thyroid Stimulating Hormone) (84443-TSH) TLB-BNP (B-Natriuretic Peptide) (83880-BNPR) TLB-Uric Acid, Blood (84550-URIC) TLB-A1C / Hgb A1C (Glycohemoglobin) (83036-A1C) TLB-Udip w/ Micro (81001-URINE) Cardiology Referral (Cardiology) EKG w/ Interpretation (93000)  Problem # 2:  CHEST PAIN UNSPECIFIED (ICD-786.50) Assessment: New  Orders: TLB-CBC Platelet - w/Differential (85025-CBCD) TLB-Hepatic/Liver Function Pnl (80076-HEPATIC) TLB-TSH (Thyroid Stimulating Hormone) (84443-TSH) TLB-BNP  (B-Natriuretic Peptide) (83880-BNPR) TLB-Uric Acid, Blood (84550-URIC) TLB-A1C / Hgb A1C (Glycohemoglobin) (83036-A1C) TLB-Udip w/ Micro (81001-URINE) Cardiology Referral (Cardiology) EKG w/ Interpretation (93000)  Problem # 3:  CONGESTIVE HEART FAILURE (ICD-428.0) she reports a prior cardiac work-up at [facility] but this info is not in the EMR data base and her old chart is not available today, I think she needs to f/up with Cardiology again. Her updated medication list for this problem includes:    Lasix 40 Mg Tabs (Furosemide) .Marland Kitchen... Take 1 1/2 tablets by mouth once a day    Aspirin Low Strength 81 Mg Chew (Aspirin) .Marland Kitchen... Take 1 tablet by mouth once a day    Azor 5-40 Mg Tabs (Amlodipine-olmesartan) ..... One by mouth once daily for high blood pressure  Orders: TLB-CBC Platelet - w/Differential (85025-CBCD) TLB-Hepatic/Liver Function Pnl (80076-HEPATIC) TLB-TSH (Thyroid Stimulating Hormone) (84443-TSH) TLB-BNP (B-Natriuretic Peptide) (83880-BNPR) TLB-Uric Acid, Blood (84550-URIC) TLB-A1C / Hgb A1C (Glycohemoglobin) (83036-A1C) TLB-Udip w/ Micro (81001-URINE) Cardiology Referral (Cardiology) EKG w/ Interpretation (93000)  Problem # 4:  GOUT (ICD-274.9) Assessment: Unchanged  Her  updated medication list for this problem includes:    Allopurinol 100 Mg Tabs (Allopurinol) ..... One by mouth once daily for gout  Orders: TLB-CBC Platelet - w/Differential (85025-CBCD) TLB-Hepatic/Liver Function Pnl (80076-HEPATIC) TLB-TSH (Thyroid Stimulating Hormone) (84443-TSH) TLB-BNP (B-Natriuretic Peptide) (83880-BNPR) TLB-Uric Acid, Blood (84550-URIC) TLB-A1C / Hgb A1C (Glycohemoglobin) (83036-A1C) TLB-Udip w/ Micro (81001-URINE)  Problem # 5:  HYPERGLYCEMIA (ICD-790.6) Assessment: Unchanged  Orders: TLB-CBC Platelet - w/Differential (85025-CBCD) TLB-Hepatic/Liver Function Pnl (80076-HEPATIC) TLB-TSH (Thyroid Stimulating Hormone) (84443-TSH) TLB-BNP (B-Natriuretic Peptide)  (83880-BNPR) TLB-Uric Acid, Blood (84550-URIC) TLB-A1C / Hgb A1C (Glycohemoglobin) (83036-A1C) TLB-Udip w/ Micro (81001-URINE)  Problem # 6:  HYPERTENSION (ICD-401.9) Assessment: Unchanged  Her updated medication list for this problem includes:    Lasix 40 Mg Tabs (Furosemide) .Marland Kitchen... Take 1 1/2 tablets by mouth once a day    Azor 5-40 Mg Tabs (Amlodipine-olmesartan) ..... One by mouth once daily for high blood pressure  Orders: TLB-CBC Platelet - w/Differential (85025-CBCD) TLB-Hepatic/Liver Function Pnl (80076-HEPATIC) TLB-TSH (Thyroid Stimulating Hormone) (84443-TSH) TLB-BNP (B-Natriuretic Peptide) (83880-BNPR) TLB-Uric Acid, Blood (84550-URIC) TLB-A1C / Hgb A1C (Glycohemoglobin) (83036-A1C) TLB-Udip w/ Micro (81001-URINE)  BP today: 144/70 Prior BP: 136/78 (02/03/2010)  Prior 10 Yr Risk Heart Disease: 11 % (04/05/2009)  Labs Reviewed: K+: 4.4 (08/22/2009) Creat: : 1.1 (08/22/2009)   Chol: 224 (08/22/2009)   HDL: 46.20 (08/22/2009)   LDL: 111 (09/19/2007)   TG: 157.0 (08/22/2009)  Complete Medication List: 1)  Lasix 40 Mg Tabs (Furosemide) .... Take 1 1/2 tablets by mouth once a day 2)  Aspirin Low Strength 81 Mg Chew (Aspirin) .... Take 1 tablet by mouth once a day 3)  Benefiber Powd (Wheat dextrin) .... Use in cereal in morning 4)  Allopurinol 100 Mg Tabs (Allopurinol) .... One by mouth once daily for gout 5)  Azor 5-40 Mg Tabs (Amlodipine-olmesartan) .... One by mouth once daily for high blood pressure 6)  Budesonide 0.5 Mg/25m Susp (Budesonide) 7)  Spiriva Handihaler 18 Mcg Caps (Tiotropium bromide monohydrate) .... One puff once daily  Other Orders: Venipuncture ((75883 TLB-BMP (Basic Metabolic Panel-BMET) (825498-YMEBRAX  Patient Instructions: 1)  Please schedule a follow-up appointment in 2 months. 2)  To prevent gout attacks,avoid purine rich foods, such as beer, beans & peas, and meat gravies. 3)  It is important that you exercise regularly at least 20 minutes  5 times a week. If you develop chest pain, have severe difficulty breathing, or feel very tired , stop exercising immediately and seek medical attention. 4)  You need to lose weight. Consider a lower calorie diet and regular exercise.  5)  Check your blood sugars regularly. If your readings are usually above 200 or below 70 you should contact our office. 6)  It is important that your Diabetic A1c level is checked every 3 months. 7)  See your eye doctor yearly to check for diabetic eye damage. 8)  Check your feet each night for sore areas, calluses or signs of infection. 9)  Check your Blood Pressure regularly. If it is above: you should make an appointment.

## 2010-08-29 NOTE — Assessment & Plan Note (Signed)
Summary: Cardiology Nuclear Testing   Nuclear Med Background Indications for Stress Test: Evaluation for Ischemia   History: Asthma, COPD, Echo, Myocardial Perfusion Study  History Comments: '05 Echo:NL '06 MPS Normal Hx. CHF  Symptoms: Chest Pain, Chest Pain with Exertion, Dizziness, DOE, Fatigue, Fatigue with Exertion, Light-Headedness, Nausea, Palpitations, Rapid HR, SOB  Symptoms Comments: Last CP 3-4 days ago. Cough with clear phlegm.   Nuclear Pre-Procedure Cardiac Risk Factors: Carotid Disease, Family History - CAD, History of Smoking, Hypertension, Lipids Caffeine/Decaff Intake: none NPO After: 5:00 PM Lungs: Clear IV 0.9% NS with Angio Cath: 22g     IV Site: R Hand IV Started by: Matilde Haymaker, RN Chest Size (in) 44     Cup Size B     Height (in): 63 Weight (lb): 220 BMI: 39.11  Nuclear Med Study 1 or 2 day study:  1 day     Stress Test Type:  Carlton Adam Reading MD:  Dola Argyle, MD     Referring MD:  J. Hochrein Resting Radionuclide:  Technetium 40mTetrofosmin     Resting Radionuclide Dose:  11.0 mCi  Stress Radionuclide:  Technetium 966metrofosmin     Stress Radionuclide Dose:  33.0 mCi   Stress Protocol      Max HR:  101 bpm     Predicted Max HR:  15383pm  Max Systolic BP: 17818m Hg     Percent Max HR:  66.89 %Rate Pressure Product:  1740375Lexiscan: 0.4 mg   Stress Test Technologist:  PaIrven Baltimore RN     Nuclear Technologist:  StCharlton AmorCNMT  Rest Procedure  Myocardial perfusion imaging was performed at rest 45 minutes following the intravenous administration of Technetium 9958mtrofosmin.  Stress Procedure  The patient received IV Lexiscan 0.4 mg over 15-seconds.  Technetium 79m39mrofosmin injected at 30-seconds.  There were no significant changes with lexiscan.  Quantitative spect images were obtained after a 45 minute delay.  QPS Raw Data Images:  Normal; no motion artifact; normal heart/lung ratio. Stress Images:  Normal homogeneous  uptake in all areas of the myocardium. Rest Images:  Normal homogeneous uptake in all areas of the myocardium. Subtraction (SDS):  No evidence of ischemia. Transient Ischemic Dilatation:  .96  (Normal <1.22)  Lung/Heart Ratio:  .31  (Normal <0.45)  Quantitative Gated Spect Images QGS EDV:  60 ml QGS ESV:  10 ml QGS EF:  83 % QGS cine images:  Normal motion  Findings Normal nuclear study      Overall Impression  Exercise Capacity: Lexiscan with no exercise. BP Response: Normal blood pressure response. Clinical Symptoms: Chest full ECG Impression: No significant ST segment change suggestive of ischemia. Overall Impression: Normal stress nuclear study.  Appended Document: Cardiology Nuclear Testing  pt aware of results  Appended Document: Cardiology Nuclear Testing  Normal.

## 2010-08-29 NOTE — Miscellaneous (Signed)
Summary: Order/[facility]  Order/[facility]   Imported By: Phillis Knack 06/21/2010 12:17:14  _____________________________________________________________________  External Attachment:    Type:   Image     Comment:   External Document

## 2010-08-29 NOTE — Assessment & Plan Note (Signed)
Summary: DISCUSS BP MEDS/ L LEG IS SWELLING/ NWS #   Vital Signs:  Patient profile:   70 year old female Menstrual status:  postmenopausal Height:      63 inches Weight:      227.38 pounds BMI:     40.42 O2 Sat:      95 % on Room air Temp:     98.6 degrees F oral Pulse rate:   71 / minute Pulse rhythm:   regular Resp:     16 per minute BP sitting:   148 / 70  (left arm) Cuff size:   large  Vitals Entered By: Estell Harpin CMA (May 25, 2010 9:30 AM)  Nutrition Counseling: Patient's BMI is greater than 25 and therefore counseled on weight management options.  O2 Flow:  Room air  Primary Care Provider:  [name] MD   History of Present Illness:  Hypertension Follow-Up      This is a 70 year old woman who presents for Hypertension follow-up.  The patient reports edema, but denies lightheadedness, urinary frequency, headaches, rash, and fatigue.  Associated symptoms include leg edema and pedal edema.  The patient denies the following associated symptoms: chest pain, chest pressure, exercise intolerance, dyspnea, palpitations, and syncope.  Compliance with medications (by patient report) has been near 100%.  The patient reports that dietary compliance has been fair.  The patient reports no exercise.  Adjunctive measures currently used by the patient include relaxation.    Preventive Screening-Counseling & Management  Alcohol-Tobacco     Alcohol drinks/day: 0     Alcohol Counseling: not indicated; patient does not drink     Smoking Status: quit     Packs/Day: 1998 15 pyh     Year Quit: 08/1996     Pack years: 30+     Passive Smoke Exposure: no     Tobacco Counseling: to remain off tobacco products  Hep-HIV-STD-Contraception     Hepatitis Risk: no risk noted     HIV Risk: no risk noted     STD Risk: no risk noted      Drug Use:  no.    Clinical Review Panels:  Prevention   Last Mammogram:  Normal (09/28/2005)   Last Pap Smear:  Normal, Satisfactory  (02/02/2008)   Last Colonoscopy:  Location:  [name].  (02/16/2008)  Immunizations   Last Tetanus Booster:  Td (10/30/2001)   Last Flu Vaccine:  Fluvax 3+ (05/12/2009)   Last Pneumovax:  Pneumovax (11/07/2006)  Lipid Management   Cholesterol:  224 (08/22/2009)   LDL (bad choesterol):  111 (09/19/2007)   HDL (good cholesterol):  46.20 (08/22/2009)  Diabetes Management   HgBA1C:  6.3 (03/22/2010)   Creatinine:  1.1 (03/22/2010)   Last Flu Vaccine:  Fluvax 3+ (05/12/2009)   Last Pneumovax:  Pneumovax (11/07/2006)  CBC   WBC:  14.9 (03/22/2010)   RBC:  4.46 (03/22/2010)   Hgb:  12.6 (03/22/2010)   Hct:  37.3 (03/22/2010)   Platelets:  238.0 (03/22/2010)   MCV  83.6 (03/22/2010)   MCHC  33.8 (03/22/2010)   RDW  15.5 (03/22/2010)   PMN:  73.6 (03/22/2010)   Lymphs:  17.0 (03/22/2010)   Monos:  7.7 (03/22/2010)   Eosinophils:  1.1 (03/22/2010)   Basophil:  0.6 (03/22/2010)  Complete Metabolic Panel   Glucose:  89 (03/22/2010)   Sodium:  144 (03/22/2010)   Potassium:  4.7 (03/22/2010)   Chloride:  103 (03/22/2010)   CO2:  32 (03/22/2010)  BUN:  23 (03/22/2010)   Creatinine:  1.1 (03/22/2010)   Albumin:  3.5 (03/22/2010)   Total Protein:  7.1 (03/22/2010)   Calcium:  9.3 (03/22/2010)   Total Bili:  0.4 (03/22/2010)   Alk Phos:  84 (03/22/2010)   SGPT (ALT):  14 (03/22/2010)   SGOT (AST):  15 (03/22/2010)   Medications Prior to Update: 1)  Lasix 40 Mg Tabs (Furosemide) .... Take 1 1/2 Tablets By Mouth Once A Day 2)  Aspirin Low Strength 81 Mg Chew (Aspirin) .... Take 1 Tablet By Mouth Once A Day 3)  Benefiber  Powd (Wheat Dextrin) .... Use in Cereal in Morning 4)  Allopurinol 100 Mg Tabs (Allopurinol) .... One By Mouth Once Daily For Gout 5)  Azor 5-40 Mg Tabs (Amlodipine-Olmesartan) .... One By Mouth Once Daily For High Blood Pressure 6)  Budesonide 0.5 Mg/68m Susp (Budesonide) 7)  Spiriva Handihaler 18 Mcg Caps (Tiotropium Bromide Monohydrate) ....  One Puff Once Daily  Current Medications (verified): 1)  Aspirin Low Strength 81 Mg Chew (Aspirin) .... Take 1 Tablet By Mouth Once A Day 2)  Benefiber  Powd (Wheat Dextrin) .... Use in Cereal in Morning 3)  Allopurinol 100 Mg Tabs (Allopurinol) .... One By Mouth Once Daily For Gout 4)  Spiriva Handihaler 18 Mcg Caps (Tiotropium Bromide Monohydrate) .... One Puff Once Daily 5)  Diovan Hct 160-25 Mg Tabs (Valsartan-Hydrochlorothiazide) .... One By Mouth Once Daily For High Blood Pressure  Allergies (verified): 1)  ! Codeine Sulfate (Codeine Sulfate) 2)  ! Amoxicillin (Amoxicillin) 3)  ! Hydrocodone-Acetaminophen (Hydrocodone-Acetaminophen) 4)  ! Beta Blockers 5)  ! Prednisone 6)  ! Hydrochlorothiazide 7)  ! Amlodipine Besylate  Past History:  Family History: Last updated: 03/19/2008 Father dec 56 Stroke Mother dec 65 CVA Ca DM Brother dec  Brother dec  Brother dec Brother A RF at 6[employer] [place] DM Sister A Cerv CA CATH Stent Sister A Lung Ca, s/p lobectomy, mets to brain, radiation, recurr to lungs Sister A Mother had kidney cancer Family History of Stomach Cancer: Mother (mets from kidneys) No FH of Colon Cancer: Family History of Clotting disorder: Sister Family History of Colitis/Crohn's: Brother (colitis) Family History of Heart Disease: Mother, Brother x 2, sister  Social History: Last updated: 03/19/2008 Occupation: [employer] Married 3 children Former Smoker-1998 stopped Alcohol use-no Drug use-no Daily Caffeine Use-4 cups daily Patient does not get regular exercise.   Risk Factors: Alcohol Use: 0 (05/25/2010) Caffeine Use: 1 (09/24/2007) Exercise: no (03/19/2008)  Risk Factors: Smoking Status: quit (05/25/2010) Packs/Day: 1998 15 pyh (05/25/2010) Passive Smoke Exposure: no (05/25/2010)  Past Medical History: Reviewed history from 01/10/2009 and no changes required. Hypertension (07/30/1988) GERD (07/30/1992) COPD  (01/27/1998) Hyperlipidemia (11/27/1996) Congestive heart failure (08/31/2003) Gout  Past Surgical History: Reviewed history from 11/17/2008 and no changes required[place] [facility] [place] Colonosc polyp rectosig ?IBS 11/1993 Abd U/S Bil Sludge 10/92 ECHO EF nml  Tr TR mild LVH 12/1999 Colonoscopy int hemms no polyps 12/24/2001         89yrECHO no change EF nml 12/17/2003 Adenosine Myoview nml EF 71% 11/29/2004 LE U/S nml  Carotid U/S nml  11/28/2004 Colonoscopy Polyp Diverticulosis (Dr PaSharlett Iles7/20/2009    3 yrs No surgeries  Family History: Reviewed history from 03/19/2008 and no changes required. Father dec 56 Stroke Mother dec 65 CVA Ca DM Brother dec  Brother dec  Brother dec Brother A RF at 5[employer]  MI DM Sister A Cerv CA CATH Stent Sister A Lung Ca, s/p lobectomy, mets to brain, radiation, recurr to lungs Sister A Mother had kidney cancer Family History of Stomach Cancer: Mother (mets from kidneys) No FH of Colon Cancer: Family History of Clotting disorder: Sister Family History of Colitis/Crohn's: Brother (colitis) Family History of Heart Disease: Mother, Brother x 2, sister  Social History: Reviewed history from 03/19/2008 and no changes required. Occupation: [employer] Married 3 children Former Smoker-1998 stopped Alcohol use-no Drug use-no Daily Caffeine Use-4 cups daily Patient does not get regular exercise.   Review of Systems       The patient complains of weight gain and peripheral edema.  The patient denies anorexia, fever, weight loss, chest pain, syncope, dyspnea on exertion, prolonged cough, headaches, hemoptysis, abdominal pain, and suspicious skin lesions.   MS:  Denies joint pain, joint redness, joint swelling, loss of strength, low back pain, and muscle aches.  Physical Exam  General:  alert, well-developed, well-nourished, well-hydrated, appropriate dress, normal appearance,  healthy-appearing, cooperative to examination, and overweight-appearing.   Mouth:  good dentition, no gingival abnormalities, no dental plaque, pharynx pink and moist, no erythema, no exudates, and no posterior lymphoid hypertrophy.   Neck:  supple, full ROM, no masses, no thyromegaly, no thyroid nodules or tenderness, no JVD, normal carotid upstroke, no carotid bruits, no cervical lymphadenopathy, and no neck tenderness.   Lungs:  normal respiratory effort, no intercostal retractions, no accessory muscle use, normal breath sounds, no dullness, no fremitus, no wheezes, R base crackles, and L base crackles.   Heart:  normal rate, regular rhythm, no murmur, no gallop, no rub, and no JVD.   Abdomen:  soft, non-tender, normal bowel sounds, no distention, no masses, no guarding, no rigidity, no rebound tenderness, no abdominal hernia, no inguinal hernia, no hepatomegaly, and no splenomegaly.   Msk:  normal ROM, no joint tenderness, no joint swelling, no joint warmth, no redness over joints, no joint deformities, no joint instability, no crepitation, and no muscle atrophy.   Pulses:  R and L carotid,radial,femoral,dorsalis pedis and posterior tibial pulses are full and equal bilaterally Extremities:  left pretibial edema and right pretibial edema.   Neurologic:  No cranial nerve deficits noted. Station and gait are normal. Plantar reflexes are down-going bilaterally. DTRs are symmetrical throughout. Sensory, motor and coordinative functions appear intact. Skin:  Intact without suspicious lesions or rashes Cervical Nodes:  No lymphadenopathy noted Axillary Nodes:  No palpable lymphadenopathy Psych:  Cognition and judgment appear intact. Alert and cooperative with normal attention span and concentration. No apparent delusions, illusions, hallucinations   Impression & Recommendations:  Problem # 1:  EDEMA (ICD-782.3) Assessment Deteriorated  The following medications were removed from the medication  list:    Lasix 40 Mg Tabs (Furosemide) .Marland Kitchen... Take 1 1/2 tablets by mouth once a day Her updated medication list for this problem includes:    Diovan Hct 160-25 Mg Tabs (Valsartan-hydrochlorothiazide) ..... One by mouth once daily for high blood pressure  Problem # 2:  GOUT (ICD-274.9) Assessment: Improved  Her updated medication list for this problem includes:    Allopurinol 100 Mg Tabs (Allopurinol) ..... One by mouth once daily for gout  Problem # 3:  HYPERTENSION (ICD-401.9) Assessment: Deteriorated  The following medications were removed from the medication list:    Lasix 40 Mg Tabs (Furosemide) .Marland Kitchen... Take 1 1/2 tablets by mouth once a day    Azor 5-40 Mg Tabs (Amlodipine-olmesartan) ..... One by mouth  once daily for high blood pressure Her updated medication list for this problem includes:    Diovan Hct 160-25 Mg Tabs (Valsartan-hydrochlorothiazide) ..... One by mouth once daily for high blood pressure  BP today: 148/70 Prior BP: 166/95 (04/19/2010)  Prior 10 Yr Risk Heart Disease: 11 % (04/05/2009)  Labs Reviewed: K+: 4.7 (03/22/2010) Creat: : 1.1 (03/22/2010)   Chol: 224 (08/22/2009)   HDL: 46.20 (08/22/2009)   LDL: 111 (09/19/2007)   TG: 157.0 (08/22/2009)  Complete Medication List: 1)  Aspirin Low Strength 81 Mg Chew (Aspirin) .... Take 1 tablet by mouth once a day 2)  Benefiber Powd (Wheat dextrin) .... Use in cereal in morning 3)  Allopurinol 100 Mg Tabs (Allopurinol) .... One by mouth once daily for gout 4)  Spiriva Handihaler 18 Mcg Caps (Tiotropium bromide monohydrate) .... One puff once daily 5)  Diovan Hct 160-25 Mg Tabs (Valsartan-hydrochlorothiazide) .... One by mouth once daily for high blood pressure  Patient Instructions: 1)  Please schedule a follow-up appointment in 2 months. 2)  Limit your Sodium (Salt) to less than 4 grams a day (slightly less than 1 teaspoon) to prevent fluid retention, swelling, or worsening or symptoms. 3)  Check your Blood Pressure  regularly. If it is above 130/80: you should make an appointment. Prescriptions: DIOVAN HCT 160-25 MG TABS (VALSARTAN-HYDROCHLOROTHIAZIDE) One by mouth once daily for high blood pressure  #140 x 0   Entered and Authorized by:   [name] MD   Signed by:   [name] MD on 05/25/2010   Method used:   Samples Given   RxID:   [id number]    Orders Added: 1)  Est. Patient Level IV [16945]

## 2010-08-29 NOTE — Progress Notes (Signed)
Summary: nuc pre procedure  Phone Note Outgoing Call Call back at Home Phone [phone]   Call placed by: Matilde Haymaker RN,  April 25, 2010 4:06 PM Call placed to: Patient Reason for Call: Confirm/change Appt Summary of Call: Left message with information on Myoview Information Sheet (see scanned document for details).      Nuclear Med Background Indications for Stress Test: Evaluation for Ischemia   History: COPD, Echo, Myocardial Perfusion Study  History Comments: 06 MPS normal  Symptoms: Chest Pain, DOE, Palpitations    Nuclear Pre-Procedure Cardiac Risk Factors: Family History - CAD, History of Smoking, Hypertension, Lipids Height (in): 63

## 2010-08-29 NOTE — Assessment & Plan Note (Signed)
Summary: NP6/EDEMA/JML      Allergies Added:   Visit Type:  Initial Consult Primary Provider:  [name] MD  CC:  Dyspnea.  History of Present Illness: The patient presents for evaluation of shortness of breath. She has had past cardiac evaluations to include echocardiograms x2 and a nuclear stress test. The nuclear stress test was in 2006 with an EF of 71% and no ischemia. The last echo was 2005 with a normal EF. She has a history of congestive heart failure with this preserved ejection fraction that was treated in the past with Lasix and salt restriction. She had shortness of breath at that time. This did improve. She has a history of chronic bronchitis and on her problem list COPD as well. She reports progressive shortness of breath since last year when she had an asthma flare. She said it has gotten to the point that she is short of breath with any activity such as pushing a vacuum cleaner. She does not describe shortness of breath at rest with PND or orthopnea. She sleeps on a couch chronically with her head propped up a little bit because both she and her husband snore. This has been unchanged. She gets some fleeting left chart discomfort in her chest occasionally but no classic substernal pressure. She does have occasional palpitations but no significant dysrhythmias. She has no presyncope or syncope.  Current Medications (verified): 1)  Lasix 40 Mg Tabs (Furosemide) .... Take 1 1/2 Tablets By Mouth Once A Day 2)  Aspirin Low Strength 81 Mg Chew (Aspirin) .... Take 1 Tablet By Mouth Once A Day 3)  Benefiber  Powd (Wheat Dextrin) .... Use in Cereal in Morning 4)  Allopurinol 100 Mg Tabs (Allopurinol) .... One By Mouth Once Daily For Gout 5)  Azor 5-40 Mg Tabs (Amlodipine-Olmesartan) .... One By Mouth Once Daily For High Blood Pressure 6)  Budesonide 0.5 Mg/45m Susp (Budesonide) 7)  Spiriva Handihaler 18 Mcg Caps (Tiotropium Bromide Monohydrate) .... One Puff Once Daily  Allergies  (verified): 1)  ! Codeine Sulfate (Codeine Sulfate) 2)  ! Amoxicillin (Amoxicillin) 3)  ! Hydrocodone-Acetaminophen (Hydrocodone-Acetaminophen) 4)  ! Beta Blockers 5)  ! Prednisone 6)  ! Hydrochlorothiazide  Past History:  Past Medical History: Reviewed history from 01/10/2009 and no changes required. Hypertension (07/30/1988) GERD (07/30/1992) COPD (01/27/1998) Hyperlipidemia (11/27/1996) Congestive heart failure (08/31/2003) Gout  Past Surgical History: Reviewed history from 11/17/2008 and no changes required. [place] [place] Colonosc polyp rectosig ?IBS 11/1993 Abd U/S Bil Sludge 10/92 ECHO EF nml  Tr TR mild LVH 12/1999 Colonoscopy int hemms no polyps 12/24/2001         573yrECHO no change EF nml 12/17/2003 Adenosine Myoview nml EF 71% 11/29/2004 LE U/S nml  Carotid U/S nml  11/28/2004 Colonoscopy Polyp Diverticulosis (Dr PaSharlett Iles7/20/2009    3 yrs No surgeries  Family History: Reviewed history from 03/19/2008 and no changes required. Father dec 56 Stroke Mother dec 65 CVA Ca DM Brother dec  Brother dec  Brother dec Brother A RF at 5[employer] [place] DM Sister A Cerv CA CATH Stent Sister A Lung Ca, s/p lobectomy, mets to brain, radiation, recurr to lungs Sister A Mother had kidney cancer Family History of Stomach Cancer: Mother (mets from kidneys) No FH of Colon Cancer: Family History of Clotting disorder: Sister Family History of Colitis/Crohn's: Brother (colitis) Family History of Heart Disease: Mother, Brother x 2, sister  Social History: Reviewed history  from 03/19/2008 and no changes required. Occupation: [employer] Married 3 children Former Smoker-1998 stopped Alcohol use-no Drug use-no Daily Caffeine Use-4 cups daily Patient does not get regular exercise.   Review of Systems       Positive for dizziness, colitis, reflux, swelling, joint pains. Otherwise as stated in the history of present illness  negative for all other systems.  Vital Signs:  Patient profile:   70 year old female Height:      63 inches Weight:      224 pounds BMI:     39.82 Pulse rate:   92 / minute Resp:     16 per minute BP sitting:   166 / 95  (right arm)  Vitals Entered By: Levora Angel, CNA (April 19, 2010 3:34 PM)  Physical Exam  General:  Well developed, well nourished, in no acute distress. Head:  normocephalic and atraumatic Eyes:  PERRLA/EOM intact; conjunctiva and lids normal. Mouth:  Upper dentures. Oral mucosa normal. Neck:  bilateral carotid bruit Chest Wall:  no deformities or breast masses noted Lungs:  no crackles, no wheezing Abdomen:  Bowel sounds positive; abdomen soft and non-tender without masses, organomegaly, or hernias noted. No hepatosplenomegaly. Msk:  Back normal, normal gait. Muscle strength and tone normal. Extremities:  mild bilateral edema with chronic venous stasis changes left greater than right Neurologic:  Alert and oriented x 3. Skin:  Intact without lesions or rashes. Cervical Nodes:  no significant adenopathy Axillary Nodes:  no significant adenopathy Inguinal Nodes:  no significant adenopathy Psych:  Normal affect.   Detailed Cardiovascular Exam  Neck    Carotids: bilateral carotid bruits    Neck Veins: Normal, no JVD.    Heart    Inspection: no deformities or lifts noted.      Palpation: normal PMI with no thrills palpable.      Auscultation: regular rate and rhythm, S1, S2 without murmurs, rubs, gallops, or clicks.    Vascular    Abdominal Aorta: no palpable masses, pulsations, or audible bruits.      Femoral Pulses: normal femoral pulses bilaterally.      Pedal Pulses: normal pedal pulses bilaterally.      Radial Pulses: normal radial pulses bilaterally.      Peripheral Circulation: no clubbing, cyanosis, or edema noted with normal capillary refill.     EKG  Procedure date:  03/22/2010  Findings:      Sinus rhythm, rate 84, axis within  normal limits, intervals within normal limits, no acute ST-T wave changes  Impression & Recommendations:  Problem # 1:  DYSPNEA (ICD-786.05) I do not have a strong suspicion that this is diastolic heart failure or systolic heart failure as her BNP was recently in the 56s and she has no exam findings of significant volume overload. However, she certainly could have ischemia given her significant risk factors and in particular a very dramatic family history. Therefore, screening with the perfusion study is indicated. She would not be able to walk on a treadmill and so we'll have pharmacologic stress.  If this is normal I might suggest pulmonary consultation. Certainly her weight and deconditioning likely contribute.  Problem # 2:  HYPERTENSION (ICD-401.9) Her blood pressure systolic was 751 at most recent visit. She does not reported to be this elevated. She should keep a blood pressure diary with further adjustments based on this. She certainly needs therapeutic lifestyle changes to include in particular weight loss.  Problem # 3:  OBESITY, UNSPECIFIED (ICD-278.00) I spent  a long time discussing the need for weight loss with this patient.  Problem # 4:  CAROTID BRUIT (ICD-785.9) The patient will have carotid Dopplers.  Problem # 5:  HYPERLIPIDEMIA (ICD-272.4) I reviewed her recent lipids and her LDL is 154. I would suggest a statin and had a long conversation with her about this as well. I think with her significant family history and this would be indicated. She does not want to start it at this time.  Other Orders: Nuclear Stress Test (Nuc Stress Test) Carotid Duplex (Carotid Duplex)  Patient Instructions: 1)  Your physician has requested that you have a carotid duplex. This test is an ultrasound of the carotid arteries in your neck. It looks at blood flow through these arteries that supply the brain with blood. Allow one hour for this exam. There are no restrictions or special  instructions. 2)  Your physician has requested that you have an adenosine myoview.  For further information please visit [email or web address].  Please follow instruction sheet, as given.  Appended Document: NP6/EDEMA/JML Nl.  No etiology of dyspnea identified on this study.

## 2010-08-29 NOTE — Letter (Signed)
Summary: Results Follow-up Letter  [facility] [address]   [place], [place] [address]   Phone: [phone]  Fax: [phone]    03/23/2010  [address] [place], [place]  [address]  Dear Ms. Tina Griffiths,   The following are the results of your recent test(s):  Test     Result     Gout level     very high CBC       slightly elevated WBC Liver/kidney   normal Blood sugars   good control heart failure   normal Urine       normal  _________________________________________________________  Please call for an appointment as directed _________________________________________________________ _________________________________________________________ _________________________________________________________  Sincerely,  [name] MD [facility]

## 2010-08-29 NOTE — Progress Notes (Signed)
----   Converted from flag ---- ---- 03/22/2010 2:49 PM, Lorraine Lax wrote: APPT 9/21 @ 4:00 WITH HOCHREIN  ---- 03/22/2010 10:36 AM, Ophelia Charter wrote: Thanks  ---- 03/22/2010 10:24 AM, [name] MD wrote: The following orders have been entered for this patient and placed on Admin Hold:  Type:     Referral       Code:   Cardiology Description:   Cardiology Referral Order Date:   03/22/2010   Authorized By:   [name] MD Order #:   [id number] Clinical Notes: ------------------------------

## 2010-08-29 NOTE — Assessment & Plan Note (Signed)
Summary: FU--D/T---STC   Vital Signs:  Patient profile:   70 year old female Menstrual status:  postmenopausal Height:      63 inches Weight:      217 pounds BMI:     38.58 O2 Sat:      94 % on Room air Temp:     98.0 degrees F oral Pulse rate:   79 / minute Pulse rhythm:   regular Resp:     16 per minute BP sitting:   134 / 76  (left arm) Cuff size:   large  Vitals Entered By: Estell Harpin CMA (July 04, 2010 10:24 AM)  Nutrition Counseling: Patient's BMI is greater than 25 and therefore counseled on weight management options.  O2 Flow:  Room air  Primary Care Provider:  [name] MD   History of Present Illness: She returns for f/up and says that she continues to improve and she wants to return to work. The pain, redness, and swelling in her legs is better.  Preventive Screening-Counseling & Management  Alcohol-Tobacco     Alcohol drinks/day: 0     Alcohol Counseling: not indicated; patient does not drink     Smoking Status: quit     Packs/Day: 1998 15 pyh     Year Quit: 08/1996     Pack years: 30+     Passive Smoke Exposure: no     Tobacco Counseling: to remain off tobacco products  Hep-HIV-STD-Contraception     Hepatitis Risk: no risk noted     HIV Risk: no risk noted     STD Risk: no risk noted  Clinical Review Panels:  Prevention   Last Mammogram:  Normal (09/28/2005)   Last Pap Smear:  Normal, Satisfactory (02/02/2008)   Last Colonoscopy:  Location:  [facility].  (02/16/2008)  Immunizations   Last Tetanus Booster:  Td (10/30/2001)   Last Flu Vaccine:  Fluvax 3+ (05/12/2009)   Last Pneumovax:  Pneumovax (11/07/2006)  Lipid Management   Cholesterol:  224 (08/22/2009)   LDL (bad choesterol):  111 (09/19/2007)   HDL (good cholesterol):  46.20 (08/22/2009)  Diabetes Management   HgBA1C:  6.3 (03/22/2010)   Creatinine:  1.2 (06/20/2010)   Last Flu Vaccine:  Fluvax 3+ (05/12/2009)   Last Pneumovax:  Pneumovax  (11/07/2006)  CBC   WBC:  14.9 (03/22/2010)   RBC:  4.46 (03/22/2010)   Hgb:  12.6 (03/22/2010)   Hct:  37.3 (03/22/2010)   Platelets:  238.0 (03/22/2010)   MCV  83.6 (03/22/2010)   MCHC  33.8 (03/22/2010)   RDW  15.5 (03/22/2010)   PMN:  73.6 (03/22/2010)   Lymphs:  17.0 (03/22/2010)   Monos:  7.7 (03/22/2010)   Eosinophils:  1.1 (03/22/2010)   Basophil:  0.6 (03/22/2010)  Complete Metabolic Panel   Glucose:  107 (06/20/2010)   Sodium:  137 (06/20/2010)   Potassium:  4.4 (06/20/2010)   Chloride:  96 (06/20/2010)   CO2:  32 (06/20/2010)   BUN:  21 (06/20/2010)   Creatinine:  1.2 (06/20/2010)   Albumin:  3.5 (03/22/2010)   Total Protein:  7.1 (03/22/2010)   Calcium:  10.0 (06/20/2010)   Total Bili:  0.4 (03/22/2010)   Alk Phos:  84 (03/22/2010)   SGPT (ALT):  14 (03/22/2010)   SGOT (AST):  15 (03/22/2010)   Medications Prior to Update: 1)  Aspirin Low Strength 81 Mg Chew (Aspirin) .... Take 1 Tablet By Mouth Once A Day 2)  Benefiber  Powd (Wheat Dextrin) .Marland KitchenMarland KitchenMarland Kitchen  Use in Cereal in Morning 3)  Allopurinol 100 Mg Tabs (Allopurinol) .... One By Mouth Once Daily For Gout 4)  Spiriva Handihaler 18 Mcg Caps (Tiotropium Bromide Monohydrate) .... One Puff Once Daily 5)  Tramadol Hcl 50 Mg Tabs (Tramadol Hcl) .Marland Kitchen.. 1-2 By Mouth Qid As Needed For Pain 6)  Hydrochlorothiazide 25 Mg Tabs (Hydrochlorothiazide) .... Take 1 Tablet By Mouth Once A Day 7)  Cefeprime 54m Iv .... X 10days 8)  Proair Hfa 108 (90 Base) Mcg/act Aers (Albuterol Sulfate) .... Two Times A Day 9)  Diovan 160 Mg Tabs (Valsartan) .... One By Mouth Once Daily For High Blood Pressure  Current Medications (verified): 1)  Aspirin Low Strength 81 Mg Chew (Aspirin) .... Take 1 Tablet By Mouth Once A Day 2)  Benefiber  Powd (Wheat Dextrin) .... Use in Cereal in Morning 3)  Allopurinol 100 Mg Tabs (Allopurinol) .... One By Mouth Once Daily For Gout 4)  Spiriva Handihaler 18 Mcg Caps (Tiotropium Bromide Monohydrate) .... One  Puff Once Daily 5)  Tramadol Hcl 50 Mg Tabs (Tramadol Hcl) ..Marland Kitchen. 1-2 By Mouth Qid As Needed For Pain 6)  Cefeprime 252mIv .... X 10days 7)  Proair Hfa 108 (90 Base) Mcg/act Aers (Albuterol Sulfate) .... Two Times A Day 8)  Diovan Hct 160-25 Mg Tabs (Valsartan-Hydrochlorothiazide) .... Take 1 Tablet By Mouth Once A Day  Allergies (verified): 1)  ! Codeine Sulfate (Codeine Sulfate) 2)  ! Amoxicillin (Amoxicillin) 3)  ! Hydrocodone-Acetaminophen (Hydrocodone-Acetaminophen) 4)  ! Beta Blockers 5)  ! Prednisone 6)  ! Hydrochlorothiazide 7)  ! Amlodipine Besylate  Past History:  Past Medical History: Last updated: 06/20/2010 Hypertension (07/30/1988) GERD (07/30/1992) COPD (01/27/1998) Hyperlipidemia (11/27/1996) Congestive heart failure (08/31/2003) Gout Renal insufficiency  Past Surgical History: [place]: [place] [place] [facility] polyp rectosig ?IBS 11/1993 Abd U/S Bil Sludge 10/92 ECHO EF nml  Tr TR mild LVH 12/1999 Colonoscopy int hemms no polyps 12/24/2001         [id number] no change EF nml 12/17/2003 Adenosine Myoview nml EF 71% 11/29/2004 LE U/S nml  Carotid U/S nml  11/28/2004 Colonoscopy Polyp Diverticulosis (Dr PatSharlett Iles/20/2009    3 yrs No surgeries  Family History: Last updated: 03/19/2008 Father dec 56 Stroke Mother dec 65 CVA Ca DM Brother dec  Brother dec  Brother dec Brother A RF at 6[employer] [place] DM Sister A Cerv CA CATH Stent Sister A Lung Ca, s/p lobectomy, mets to brain, radiation, recurr to lungs Sister A Mother had kidney cancer Family History of Stomach Cancer: Mother (mets from kidneys) No FH of Colon Cancer: Family History of Clotting disorder: Sister Family History of Colitis/Crohn's: Brother (colitis) Family History of Heart Disease: Mother, Brother x 2, sister  Social History: Last updated: 03/19/2008 Occupation: [employer] Married 3 children Former Smoker-1998  stopped Alcohol use-no Drug use-no Daily Caffeine Use-4 cups daily Patient does not get regular exercise.   Risk Factors: Alcohol Use: 0 (07/04/2010) Caffeine Use: 1 (09/24/2007) Exercise: no (03/19/2008)  Risk Factors: Smoking Status: quit (07/04/2010) Packs/Day: 1998 15 pyh (07/04/2010) Passive Smoke Exposure: no (07/04/2010)  Family History: Reviewed history from 03/19/2008 and no changes required. Father dec 56 Stroke Mother dec 65 CVA Ca DM Brother dec  Brother dec  Brother dec Brother A RF at 6[employer] [place] DM Sister A Cerv CA CATH Stent Sister A Lung Ca, s/p lobectomy, mets to brain, radiation, recurr to lungs Sister  A Mother had kidney cancer Family History of Stomach Cancer: Mother (mets from kidneys) No FH of Colon Cancer: Family History of Clotting disorder: Sister Family History of Colitis/Crohn's: Brother (colitis) Family History of Heart Disease: Mother, Brother x 2, sister  Social History: Reviewed history from 03/19/2008 and no changes required. Occupation: [employer] Married 3 children Former Smoker-1998 stopped Alcohol use-no Drug use-no Daily Caffeine Use-4 cups daily Patient does not get regular exercise.   Review of Systems General:  Denies chills, fatigue, fever, loss of appetite, malaise, sleep disorder, and sweats.  Physical Exam  General:  alert, well-developed, well-nourished, well-hydrated, appropriate dress, normal appearance, healthy-appearing, cooperative to examination, and overweight-appearing.   Head:  normocephalic, atraumatic, no abnormalities observed, and no abnormalities palpated.   Mouth:  good dentition, pharynx pink and moist, no erythema, no exudates, no posterior lymphoid hypertrophy, no postnasal drip, no pharyngeal crowing, no lesions, no aphthous ulcers, no erosions, no tongue abnormalities, and no leukoplakia.   Neck:  supple, full ROM, no masses, no thyromegaly, no thyroid nodules or tenderness, no  JVD, normal carotid upstroke, no carotid bruits, no cervical lymphadenopathy, and no neck tenderness.   Lungs:  normal respiratory effort, no intercostal retractions, no accessory muscle use, normal breath sounds, no dullness, no fremitus, no crackles, and no wheezes.   Heart:  normal rate, regular rhythm, no murmur, no gallop, no rub, and no JVD.   Abdomen:  soft, non-tender, normal bowel sounds, no distention, no masses, no guarding, no rigidity, no rebound tenderness, no abdominal hernia, no inguinal hernia, no hepatomegaly, and no splenomegaly.   Msk:  left and right anterior lower legs have dark red induration, but no weeping, erythema, or foul-smelling scant exudate and no induration, streaking, fluctuance, or abscess formation. prior site of PICC line looks good. Pulses:  R and L carotid,radial,femoral,dorsalis pedis and posterior tibial pulses are full and equal bilaterally Extremities:  1+ left pedal edema and 1+ right pedal edema.   Neurologic:  No cranial nerve deficits noted. Station and gait are normal. Plantar reflexes are down-going bilaterally. DTRs are symmetrical throughout. Sensory, motor and coordinative functions appear intact. Skin:  Intact without suspicious lesions or rashes Cervical Nodes:  no anterior cervical adenopathy and no posterior cervical adenopathy.   Psych:  Cognition and judgment appear intact. Alert and cooperative with normal attention span and concentration. No apparent delusions, illusions, hallucinations   Impression & Recommendations:  Problem # 1:  RENAL INSUFFICIENCY (ICD-588.9) Assessment Improved  Problem # 2:  CELLULITIS, LEFT LEG (ICD-682.6) Assessment: Improved  Problem # 3:  HYPERTENSION (ICD-401.9) Assessment: Improved  The following medications were removed from the medication list:    Hydrochlorothiazide 25 Mg Tabs (Hydrochlorothiazide) .Marland Kitchen... Take 1 tablet by mouth once a day Her updated medication list for this problem includes:     Diovan Hct 160-25 Mg Tabs (Valsartan-hydrochlorothiazide) .Marland Kitchen... Take 1 tablet by mouth once a day  BP today: 134/76 Prior BP: 170/90 (06/20/2010)  Prior 10 Yr Risk Heart Disease: 17 % (06/20/2010)  Labs Reviewed: K+: 4.4 (06/20/2010) Creat: : 1.2 (06/20/2010)   Chol: 224 (08/22/2009)   HDL: 46.20 (08/22/2009)   LDL: 111 (09/19/2007)   TG: 157.0 (08/22/2009)  Complete Medication List: 1)  Aspirin Low Strength 81 Mg Chew (Aspirin) .... Take 1 tablet by mouth once a day 2)  Benefiber Powd (Wheat dextrin) .... Use in cereal in morning 3)  Allopurinol 100 Mg Tabs (Allopurinol) .... One by mouth once daily for gout 4)  Spiriva Handihaler 18  Mcg Caps (Tiotropium bromide monohydrate) .... One puff once daily 5)  Tramadol Hcl 50 Mg Tabs (Tramadol hcl) .Marland Kitchen.. 1-2 by mouth qid as needed for pain 6)  Proair Hfa 108 (90 Base) Mcg/act Aers (Albuterol sulfate) .... Two times a day 7)  Diovan Hct 160-25 Mg Tabs (Valsartan-hydrochlorothiazide) .... Take 1 tablet by mouth once a day  Patient Instructions: 1)  Please schedule a follow-up appointment in 3 months. 2)  It is important that you exercise regularly at least 20 minutes 5 times a week. If you develop chest pain, have severe difficulty breathing, or feel very tired , stop exercising immediately and seek medical attention. 3)  You need to lose weight. Consider a lower calorie diet and regular exercise.  4)  Check your Blood Pressure regularly. If it is above 130/80: you should make an appointment.   Orders Added: 1)  Est. Patient Level III [53976]

## 2010-08-29 NOTE — Assessment & Plan Note (Signed)
Summary: 2 wk f/u #/cd   Vital Signs:  Patient profile:   70 year old female Menstrual status:  postmenopausal Height:      63 inches Weight:      217 pounds BMI:     38.58 O2 Sat:      96 % on Room air Temp:     98.9 degrees F oral Pulse rate:   91 / minute Pulse rhythm:   regular Resp:     16 per minute BP sitting:   170 / 90  (left arm) Cuff size:   large  Vitals Entered By: Estell Harpin CMA (June 20, 2010 10:14 AM)  Nutrition Counseling: Patient's BMI is greater than 25 and therefore counseled on weight management options.  O2 Flow:  Room air CC: hospital follow-up visit, Hypertension Management Is Patient Diabetic? No Pain Assessment Patient in pain? no        Primary Care Provider:  [name] MD  CC:  hospital follow-up visit and Hypertension Management.  History of Present Illness: She returns for f/up after a hospital admission and she is still receiving IV antibiotics ( cefepime) thru a PICC line in her RUE. Her legs are doing much better with very localized areas of redness bilaterally but no exudate or pain. She had some pre-renal insufficiency in the hospital and HCTZ was held, she needs BUN and creat. rechecked today.  Hypertension History:      She complains of peripheral edema, but denies headache, chest pain, palpitations, dyspnea with exertion, orthopnea, PND, visual symptoms, neurologic problems, syncope, and side effects from treatment.  She notes no problems with any antihypertensive medication side effects.        Positive major cardiovascular risk factors include female age 48 years old or older, hyperlipidemia, and hypertension.  Negative major cardiovascular risk factors include no history of diabetes, negative family history for ischemic heart disease, and non-tobacco-user status.        Positive history for target organ damage include cardiac end organ damage (either CHF or LVH).  Further assessment for target organ damage reveals no  history of ASHD, stroke/TIA, peripheral vascular disease, renal insufficiency, or hypertensive retinopathy.     Preventive Screening-Counseling & Management  Alcohol-Tobacco     Alcohol drinks/day: 0     Alcohol Counseling: not indicated; patient does not drink     Smoking Status: quit     Packs/Day: 1998 15 pyh     Year Quit: 08/1996     Pack years: 30+     Passive Smoke Exposure: no     Tobacco Counseling: to remain off tobacco products  Hep-HIV-STD-Contraception     Hepatitis Risk: no risk noted     HIV Risk: no risk noted     STD Risk: no risk noted  Clinical Review Panels:  Prevention   Last Mammogram:  Normal (09/28/2005)   Last Pap Smear:  Normal, Satisfactory (02/02/2008)   Last Colonoscopy:  Location:  [place].  (02/16/2008)  Immunizations   Last Tetanus Booster:  Td (10/30/2001)   Last Flu Vaccine:  Fluvax 3+ (05/12/2009)   Last Pneumovax:  Pneumovax (11/07/2006)  Lipid Management   Cholesterol:  224 (08/22/2009)   LDL (bad choesterol):  111 (09/19/2007)   HDL (good cholesterol):  46.20 (08/22/2009)  Diabetes Management   HgBA1C:  6.3 (03/22/2010)   Creatinine:  1.1 (03/22/2010)   Last Flu Vaccine:  Fluvax 3+ (05/12/2009)   Last Pneumovax:  Pneumovax (11/07/2006)  CBC   WBC:  14.9 (03/22/2010)   RBC:  4.46 (03/22/2010)   Hgb:  12.6 (03/22/2010)   Hct:  37.3 (03/22/2010)   Platelets:  238.0 (03/22/2010)   MCV  83.6 (03/22/2010)   MCHC  33.8 (03/22/2010)   RDW  15.5 (03/22/2010)   PMN:  73.6 (03/22/2010)   Lymphs:  17.0 (03/22/2010)   Monos:  7.7 (03/22/2010)   Eosinophils:  1.1 (03/22/2010)   Basophil:  0.6 (03/22/2010)  Complete Metabolic Panel   Glucose:  89 (03/22/2010)   Sodium:  144 (03/22/2010)   Potassium:  4.7 (03/22/2010)   Chloride:  103 (03/22/2010)   CO2:  32 (03/22/2010)   BUN:  23 (03/22/2010)   Creatinine:  1.1 (03/22/2010)   Albumin:  3.5 (03/22/2010)   Total Protein:  7.1 (03/22/2010)   Calcium:  9.3  (03/22/2010)   Total Bili:  0.4 (03/22/2010)   Alk Phos:  84 (03/22/2010)   SGPT (ALT):  14 (03/22/2010)   SGOT (AST):  15 (03/22/2010)   Medications Prior to Update: 1)  Aspirin Low Strength 81 Mg Chew (Aspirin) .... Take 1 Tablet By Mouth Once A Day 2)  Benefiber  Powd (Wheat Dextrin) .... Use in Cereal in Morning 3)  Allopurinol 100 Mg Tabs (Allopurinol) .... One By Mouth Once Daily For Gout 4)  Spiriva Handihaler 18 Mcg Caps (Tiotropium Bromide Monohydrate) .... One Puff Once Daily 5)  Diovan Hct 160-25 Mg Tabs (Valsartan-Hydrochlorothiazide) .... One By Mouth Once Daily For High Blood Pressure 6)  Tramadol Hcl 50 Mg Tabs (Tramadol Hcl) .Marland Kitchen.. 1-2 By Mouth Qid As Needed For Pain  Current Medications (verified): 1)  Aspirin Low Strength 81 Mg Chew (Aspirin) .... Take 1 Tablet By Mouth Once A Day 2)  Benefiber  Powd (Wheat Dextrin) .... Use in Cereal in Morning 3)  Allopurinol 100 Mg Tabs (Allopurinol) .... One By Mouth Once Daily For Gout 4)  Spiriva Handihaler 18 Mcg Caps (Tiotropium Bromide Monohydrate) .... One Puff Once Daily 5)  Tramadol Hcl 50 Mg Tabs (Tramadol Hcl) .Marland Kitchen.. 1-2 By Mouth Qid As Needed For Pain 6)  Hydrochlorothiazide 25 Mg Tabs (Hydrochlorothiazide) .... Take 1 Tablet By Mouth Once A Day 7)  Cefeprime 53m Iv .... X 10days 8)  Proair Hfa 108 (90 Base) Mcg/act Aers (Albuterol Sulfate) .... Two Times A Day 9)  Diovan 160 Mg Tabs (Valsartan) .... One By Mouth Once Daily For High Blood Pressure  Allergies (verified): 1)  ! Codeine Sulfate (Codeine Sulfate) 2)  ! Amoxicillin (Amoxicillin) 3)  ! Hydrocodone-Acetaminophen (Hydrocodone-Acetaminophen) 4)  ! Beta Blockers 5)  ! Prednisone 6)  ! Hydrochlorothiazide 7)  ! Amlodipine Besylate  Past History:  Family History: Last updated: 03/19/2008 Father dec 56 Stroke Mother dec 65 CVA Ca DM Brother dec  Brother dec  Brother dec Brother A RF at 6[employer] [place] DM Sister A Cerv CA CATH Stent Sister A Lung Ca,  s/p lobectomy, mets to brain, radiation, recurr to lungs Sister A Mother had kidney cancer Family History of Stomach Cancer: Mother (mets from kidneys) No FH of Colon Cancer: Family History of Clotting disorder: Sister Family History of Colitis/Crohn's: Brother (colitis) Family History of Heart Disease: Mother, Brother x 2, sister  Social History: Last updated: 03/19/2008 Occupation: [employer] Married 3 children Former Smoker-1998 stopped Alcohol use-no Drug use-no Daily Caffeine Use-4 cups daily Patient does not get regular exercise.   Risk Factors: Alcohol Use: 0 (06/20/2010) Caffeine Use: 1 (09/24/2007) Exercise: no (03/19/2008)  Risk Factors: Smoking Status: quit (06/20/2010)  Packs/Day: 1998 15 pyh (06/20/2010) Passive Smoke Exposure: no (06/20/2010)  Past Medical History: Hypertension (07/30/1988) GERD (07/30/1992) COPD (01/27/1998) Hyperlipidemia (11/27/1996) Congestive heart failure (08/31/2003) Gout Renal insufficiency  Past Surgical History: Reviewed history from 11/17/2008 and no changes required. Fertile  CT Chest nml 04/1998 Colonosc polyp rectosig ?IBS 11/1993 Abd U/S Bil Sludge 10/92 ECHO EF nml  Tr TR mild LVH 12/1999 Colonoscopy int hemms no polyps 12/24/2001         40yr ECHO no change EF nml 12/17/2003 Adenosine Myoview nml EF 71% 11/29/2004 LE U/S nml  Carotid U/S nml  11/28/2004 Colonoscopy Polyp Diverticulosis (Dr PSharlett Iles 02/16/2008    3 yrs No surgeries  Family History: Reviewed history from 03/19/2008 and no changes required. Father dec 56 Stroke Mother dec 65 CVA Ca DM Brother dec  Brother dec  Brother dec Brother A RF at 6[employer] [place] DM Sister A Cerv CA CATH Stent Sister A Lung Ca, s/p lobectomy, mets to brain, radiation, recurr to lungs Sister A Mother had kidney cancer Family History of Stomach Cancer: Mother (mets from kidneys) No FH of Colon Cancer: Family History of Clotting  disorder: Sister Family History of Colitis/Crohn's: Brother (colitis) Family History of Heart Disease: Mother, Brother x 2, sister  Social History: Reviewed history from 03/19/2008 and no changes required. Occupation: [employer] Married 3 children Former Smoker-1998 stopped Alcohol use-no Drug use-no Daily Caffeine Use-4 cups daily Patient does not get regular exercise.   Review of Systems General:  Denies chills, fatigue, fever, loss of appetite, malaise, and sweats. MS:  Denies joint pain, joint redness, joint swelling, loss of strength, and muscle aches. Derm:  Complains of changes in color of skin; denies dryness, excessive perspiration, flushing, itching, lesion(s), poor wound healing, and rash.  Physical Exam  General:  alert, well-developed, well-nourished, well-hydrated, appropriate dress, normal appearance, healthy-appearing, cooperative to examination, and overweight-appearing.   Head:  normocephalic, atraumatic, no abnormalities observed, and no abnormalities palpated.   Mouth:  good dentition, pharynx pink and moist, no erythema, no exudates, no posterior lymphoid hypertrophy, no postnasal drip, no pharyngeal crowing, no lesions, no aphthous ulcers, no erosions, no tongue abnormalities, and no leukoplakia.   Neck:  supple, full ROM, no masses, no thyromegaly, no thyroid nodules or tenderness, no JVD, normal carotid upstroke, no carotid bruits, no cervical lymphadenopathy, and no neck tenderness.   Lungs:  normal respiratory effort, no intercostal retractions, no accessory muscle use, normal breath sounds, no dullness, no fremitus, no crackles, and no wheezes.   Heart:  normal rate, regular rhythm, no murmur, no gallop, no rub, and no JVD.   Abdomen:  soft, non-tender, normal bowel sounds, no distention, no masses, no guarding, no rigidity, no rebound tenderness, no abdominal hernia, no inguinal hernia, no hepatomegaly, and no splenomegaly.   Msk:  left and right  anterior lower legs have dark red induration, but no weeping, erythema, or foul-smelling scant exudate and no induration, streaking, fluctuance, or abscess formation. the PICC line site looks good. Pulses:  R and L carotid,radial,femoral,dorsalis pedis and posterior tibial pulses are full and equal bilaterally Extremities:  1+ left pedal edema and 1+ right pedal edema.   Neurologic:  No cranial nerve deficits noted. Station and gait are normal. Plantar reflexes are down-going bilaterally. DTRs are symmetrical throughout. Sensory, motor and coordinative functions appear intact. Skin:  Intact without suspicious lesions or rashes Cervical Nodes:  no anterior cervical adenopathy and no posterior cervical adenopathy.  Psych:  Cognition and judgment appear intact. Alert and cooperative with normal attention span and concentration. No apparent delusions, illusions, hallucinations   Impression & Recommendations:  Problem # 1:  CELLULITIS, LEFT LEG (ICD-682.6) Assessment Improved continue IV cefepime  Problem # 2:  RENAL INSUFFICIENCY (ICD-588.9) Assessment: New  Orders: Venipuncture (35361) TLB-BMP (Basic Metabolic Panel-BMET) ([id number])  Problem # 3:  EDEMA (ICD-782.3) Assessment: Unchanged  The following medications were removed from the medication list:    Diovan Hct 160-25 Mg Tabs (Valsartan-hydrochlorothiazide) ..... One by mouth once daily for high blood pressure Her updated medication list for this problem includes:    Hydrochlorothiazide 25 Mg Tabs (Hydrochlorothiazide) .Marland Kitchen... Take 1 tablet by mouth once a day  Problem # 4:  HYPERTENSION (ICD-401.9) Assessment: Deteriorated  The following medications were removed from the medication list:    Diovan Hct 160-25 Mg Tabs (Valsartan-hydrochlorothiazide) ..... One by mouth once daily for high blood pressure Her updated medication list for this problem includes:    Hydrochlorothiazide 25 Mg Tabs (Hydrochlorothiazide) .Marland Kitchen... Take 1  tablet by mouth once a day    Diovan 160 Mg Tabs (Valsartan) ..... One by mouth once daily for high blood pressure  Orders: Venipuncture (19509) TLB-BMP (Basic Metabolic Panel-BMET) ([id number])  BP today: 170/90 Prior BP: 142/70 (06/06/2010)  10 Yr Risk Heart Disease: 17 % Prior 10 Yr Risk Heart Disease: 15 % (06/06/2010)  Labs Reviewed: K+: 4.7 (03/22/2010) Creat: : 1.1 (03/22/2010)   Chol: 224 (08/22/2009)   HDL: 46.20 (08/22/2009)   LDL: 111 (09/19/2007)   TG: 157.0 (08/22/2009)  Complete Medication List: 1)  Aspirin Low Strength 81 Mg Chew (Aspirin) .... Take 1 tablet by mouth once a day 2)  Benefiber Powd (Wheat dextrin) .... Use in cereal in morning 3)  Allopurinol 100 Mg Tabs (Allopurinol) .... One by mouth once daily for gout 4)  Spiriva Handihaler 18 Mcg Caps (Tiotropium bromide monohydrate) .... One puff once daily 5)  Tramadol Hcl 50 Mg Tabs (Tramadol hcl) .Marland Kitchen.. 1-2 by mouth qid as needed for pain 6)  Hydrochlorothiazide 25 Mg Tabs (Hydrochlorothiazide) .... Take 1 tablet by mouth once a day 7)  Cefeprime 45m Iv  .... X 10days 8)  Proair Hfa 108 (90 Base) Mcg/act Aers (Albuterol sulfate) .... Two times a day 9)  Diovan 160 Mg Tabs (Valsartan) .... One by mouth once daily for high blood pressure  Hypertension Assessment/Plan:      The patient's hypertensive risk group is category C: Target organ damage and/or diabetes.  Her calculated 10 year risk of coronary heart disease is 17 %.  Today's blood pressure is 170/90.  Her blood pressure goal is < 140/90.  Patient Instructions: 1)  Please schedule a follow-up appointment in 2 weeks. 2)  Check your Blood Pressure regularly. If it is above 140/90: you should make an appointment. 3)  Take your antibiotic as prescribed until ALL of it is gone, but stop if you develop a rash or swelling and contact our office as soon as possible. Prescriptions: DIOVAN 160 MG TABS (VALSARTAN) One by mouth once daily for high blood pressure   #112 x 0   Entered and Authorized by:   TJanith LimaMD   Signed by:   TJanith LimaMD on 06/20/2010   Method used:   Samples Given   RxID:   [id number]   Orders Added: 1)  Venipuncture [[93790]2)  TLB-BMP (Basic Metabolic Panel-BMET) [[[id number]]3)  Est. Patient Level IV [[42683]

## 2010-08-29 NOTE — Progress Notes (Signed)
  Phone Note Outgoing Call   Summary of Call: LA- I spoke to [name] this morning and told her that her culture was positive, she said she is not much better so I directed her to the hospital for IV antibiotics, please get a copy of her last office note and the culture result to the hospital.  Thanks, TJ Initial call taken by: [name] MD,  June 09, 2010 8:18 AM  Follow-up for Phone Call        Per pt, she is going to [facility] ER. Called ER triage nurse and advised that I will fax over note/lab  to [phone] Follow-up by: Estell Harpin CMA,  June 09, 2010 9:08 AM

## 2010-08-29 NOTE — Letter (Signed)
Summary: Results Follow-up Letter  [facility]   [place], [place] [address]   Phone: [phone]  Fax: [phone]    08/22/2009  [address] [place], [place]  [address]  Dear Ms. Tina Griffiths,   The following are the results of your recent test(s):  Test     Result     B12       low CBC       elevated WBC count Liver/kidney   normal Blood sugar     good average Urine       normal Thyroid     normal   _________________________________________________________  Please call for an appointment soon for a B12 injection _________________________________________________________ _________________________________________________________ _________________________________________________________  Sincerely,  [name] MD [facility]

## 2010-08-29 NOTE — Miscellaneous (Signed)
Summary: Order/[facility]  Order/[facility]   Imported By: Phillis Knack 06/30/2010 15:12:39  _____________________________________________________________________  External Attachment:    Type:   Image     Comment:   External Document

## 2010-08-29 NOTE — Miscellaneous (Signed)
Summary: Plan of Care/[facility]  Plan of Care/[facility]   Imported By: Bubba Hales 06/29/2010 10:51:35  _____________________________________________________________________  External Attachment:    Type:   Image     Comment:   External Document

## 2010-08-31 NOTE — Progress Notes (Signed)
Summary: RESULTS  Phone Note Call from Patient Call back at Home Phone [phone]   Caller: Patient Call For: [name] MD Summary of Call: Pt requesting results of chest xay & labs done. Please advise. Initial call taken by: Denice Paradise,  July 28, 2010 3:20 PM  Follow-up for Phone Call        xray is normal. kidney function is a little better. wbc count is high. Follow-up by: [name] MD,  July 28, 2010 4:01 PM  Additional Follow-up for Phone Call Additional follow up Details #1::        Pt informed  Additional Follow-up by: Charlsie Quest, CMA,  July 28, 2010 5:12 PM

## 2010-08-31 NOTE — Assessment & Plan Note (Signed)
Summary: FU [name]   Vital Signs:  Patient profile:   70 year old female Menstrual status:  postmenopausal Height:      63 inches Weight:      212 pounds BMI:     37.69 O2 Sat:      98 % on Room air Temp:     98.3 degrees F oral Pulse rate:   92 / minute Pulse rhythm:   regular Resp:     16 per minute BP sitting:   142 / 76  (left arm) Cuff size:   large  Vitals Entered By: Estell Harpin CMA (August 10, 2010 10:17 AM)  Nutrition Counseling: Patient's BMI is greater than 25 and therefore counseled on weight management options.  O2 Flow:  Room air CC: follow-up visit, Diarrhea Is Patient Diabetic? No Pain Assessment Patient in pain? no        Primary Care Provider:  [name] MD  CC:  follow-up visit and Diarrhea.  History of Present Illness:  Diarrhea      This is a 70 year old woman who presents with Diarrhea.  The symptoms began 3 days ago.  The severity is described as mild.  The patient reports 3 stools or less per day, semiformed/loose stools, and gradual onset of symptoms, but denies watery/unformed stools, voluminous stools, blood in stool, mucus in stool, greasy stools, malodorous stools, fecal urgency, fecal soiling, alternating diarrhea/constipation, nocturnal diarrhea, fasting diarrhea, bloating, gassiness, and abrupt onset of symptoms.  Associated symptoms include nausea.  The patient denies fever, abdominal pain, abdominal cramps, vomiting, lightheadedness, increased thirst, weight loss, joint pains, mouth ulcers, and eye redness.  The symptoms are worse with any food.  The symptoms are better with fasting.  Patient's risk factors for diarrhea include recent antibiotic use.  Patient has a  history of diverticulitis.    Preventive Screening-Counseling & Management  Alcohol-Tobacco     Alcohol drinks/day: 0     Alcohol Counseling: not indicated; patient does not drink     Smoking Status: quit     Packs/Day: 1998 15 pyh     Year Quit: 08/1996     Pack  years: 30+     Passive Smoke Exposure: no     Tobacco Counseling: to remain off tobacco products  Hep-HIV-STD-Contraception     Hepatitis Risk: no risk noted     HIV Risk: no risk noted     STD Risk: no risk noted      Drug Use:  no.    Clinical Review Panels:  Prevention   Last Mammogram:  Normal (09/28/2005)   Last Pap Smear:  Normal, Satisfactory (02/02/2008)   Last Colonoscopy:  Location:  [facility].  (02/16/2008)  Immunizations   Last Tetanus Booster:  Td (10/30/2001)   Last Flu Vaccine:  Historical (04/29/2010)   Last Pneumovax:  Pneumovax (11/07/2006)  Lipid Management   Cholesterol:  224 (08/22/2009)   LDL (bad choesterol):  111 (09/19/2007)   HDL (good cholesterol):  46.20 (08/22/2009)  Diabetes Management   HgBA1C:  6.3 (03/22/2010)   Creatinine:  1.3 (07/27/2010)   Last Flu Vaccine:  Historical (04/29/2010)   Last Pneumovax:  Pneumovax (11/07/2006)  CBC   WBC:  13.0 (07/27/2010)   RBC:  4.83 (07/27/2010)   Hgb:  13.2 (07/27/2010)   Hct:  39.3 (07/27/2010)   Platelets:  287.0 (07/27/2010)   MCV  81.4 (07/27/2010)   MCHC  33.7 (07/27/2010)   RDW  14.0 (07/27/2010)   PMN:  70.4 (07/27/2010)   Lymphs:  19.4 (07/27/2010)   Monos:  6.4 (07/27/2010)   Eosinophils:  3.5 (07/27/2010)   Basophil:  0.3 (07/27/2010)  Complete Metabolic Panel   Glucose:  104 (07/27/2010)   Sodium:  136 (07/27/2010)   Potassium:  4.0 (07/27/2010)   Chloride:  96 (07/27/2010)   CO2:  31 (07/27/2010)   BUN:  23 (07/27/2010)   Creatinine:  1.3 (07/27/2010)   Albumin:  3.6 (07/27/2010)   Total Protein:  7.4 (07/27/2010)   Calcium:  9.4 (07/27/2010)   Total Bili:  0.2 (07/27/2010)   Alk Phos:  69 (07/27/2010)   SGPT (ALT):  13 (07/27/2010)   SGOT (AST):  19 (07/27/2010)   Medications Prior to Update: 1)  Aspirin Low Strength 81 Mg Chew (Aspirin) .... Take 1 Tablet By Mouth Once A Day 2)  Benefiber  Powd (Wheat Dextrin) .... Use in Cereal in Morning 3)   Allopurinol 100 Mg Tabs (Allopurinol) .... One By Mouth Once Daily For Gout 4)  Spiriva Handihaler 18 Mcg Caps (Tiotropium Bromide Monohydrate) .... One Puff Once Daily 5)  Tramadol Hcl 50 Mg Tabs (Tramadol Hcl) .Marland Kitchen.. 1-2 By Mouth Qid As Needed For Pain 6)  Proair Hfa 108 (90 Base) Mcg/act Aers (Albuterol Sulfate) .... Two Times A Day 7)  Diovan Hct 160-25 Mg Tabs (Valsartan-Hydrochlorothiazide) .... Take 1 Tablet By Mouth Once A Day 8)  Ketoconazole 2 % Crea (Ketoconazole) .... Apply To Areas in Groin Two Times A Day For 14 Days  Current Medications (verified): 1)  Aspirin Low Strength 81 Mg Chew (Aspirin) .... Take 1 Tablet By Mouth Once A Day 2)  Benefiber  Powd (Wheat Dextrin) .... Use in Cereal in Morning 3)  Allopurinol 100 Mg Tabs (Allopurinol) .... One By Mouth Once Daily For Gout 4)  Spiriva Handihaler 18 Mcg Caps (Tiotropium Bromide Monohydrate) .... One Puff Once Daily 5)  Tramadol Hcl 50 Mg Tabs (Tramadol Hcl) .Marland Kitchen.. 1-2 By Mouth Qid As Needed For Pain 6)  Proair Hfa 108 (90 Base) Mcg/act Aers (Albuterol Sulfate) .... Two Times A Day 7)  Diovan Hct 160-25 Mg Tabs (Valsartan-Hydrochlorothiazide) .... Take 1 Tablet By Mouth Once A Day 8)  Ketoconazole 2 % Crea (Ketoconazole) .... Apply To Areas in Groin Two Times A Day For 14 Days 9)  Align 4 Mg Caps (Probiotic Product) .... One By Mouth Once Daily 10)  Ondansetron Hcl 4 Mg Tabs (Ondansetron Hcl) .... One By Mouth Qid As Needed For Nausea  Allergies (verified): 1)  ! Codeine Sulfate (Codeine Sulfate) 2)  ! Amoxicillin (Amoxicillin) 3)  ! Hydrocodone-Acetaminophen (Hydrocodone-Acetaminophen) 4)  ! Beta Blockers 5)  ! Prednisone 6)  ! Hydrochlorothiazide 7)  ! Amlodipine Besylate  Past History:  Past Medical History: Last updated: 06/20/2010 Hypertension (07/30/1988) GERD (07/30/1992) COPD (01/27/1998) Hyperlipidemia (11/27/1996) Congestive heart failure (08/31/2003) Gout Renal insufficiency  Past Surgical History: Last  updated: 11/17/2008 [facility]  CT Chest nml 04/1998 Colonosc polyp rectosig ?IBS 11/1993 Abd U/S Bil Sludge 10/92 ECHO EF nml  Tr TR mild LVH 12/1999 Colonoscopy int hemms no polyps 12/24/2001         83yr ECHO no change EF nml 12/17/2003 Adenosine Myoview nml EF 71% 11/29/2004 LE U/S nml  Carotid U/S nml  11/28/2004 Colonoscopy Polyp Diverticulosis (Dr PSharlett Iles 02/16/2008    3 yrs No surgeries  Family History: Last updated: 03/19/2008 Father dec 56 Stroke Mother dec 65 CVA Ca DM Brother dec  Brother dec  Brother dec  Brother A RF at [employer] dec MI DM Sister A Cerv CA CATH Stent Sister A Lung Ca, s/p lobectomy, mets to brain, radiation, recurr to lungs Sister A Mother had kidney cancer Family History of Stomach Cancer: Mother (mets from kidneys) No FH of Colon Cancer: Family History of Clotting disorder: Sister Family History of Colitis/Crohn's: Brother (colitis) Family History of Heart Disease: Mother, Brother x 2, sister  Social History: Last updated: 03/19/2008 Occupation: [employer] Married 3 children Former Smoker-1998 stopped Alcohol use-no Drug use-no Daily Caffeine Use-4 cups daily Patient does not get regular exercise.   Risk Factors: Alcohol Use: 0 (08/10/2010) Caffeine Use: 1 (09/24/2007) Exercise: no (03/19/2008)  Risk Factors: Smoking Status: quit (08/10/2010) Packs/Day: 1998 15 pyh (08/10/2010) Passive Smoke Exposure: no (08/10/2010)  Family History: Reviewed history from 03/19/2008 and no changes required. Father dec 56 Stroke Mother dec 65 CVA Ca DM Brother dec  Brother dec  Brother dec Brother A RF at [employer] dec [place] DM Sister A Cerv CA CATH Stent Sister A Lung Ca, s/p lobectomy, mets to brain, radiation, recurr to lungs Sister A Mother had kidney cancer Family History of Stomach Cancer: Mother (mets from kidneys) No FH of Colon Cancer: Family History of Clotting disorder: Sister Family History of  Colitis/Crohn's: Brother (colitis) Family History of Heart Disease: Mother, Brother x 2, sister  Social History: Reviewed history from 03/19/2008 and no changes required. Occupation: [employer] Married 3 children Former Smoker-1998 stopped Alcohol use-no Drug use-no Daily Caffeine Use-4 cups daily Patient does not get regular exercise.   Review of Systems  The patient denies anorexia, fever, weight loss, weight gain, chest pain, dyspnea on exertion, prolonged cough, headaches, hemoptysis, abdominal pain, melena, hematochezia, severe indigestion/heartburn, hematuria, suspicious skin lesions, unusual weight change, abnormal bleeding, and enlarged lymph nodes.   GI:  Complains of diarrhea and nausea; denies abdominal pain, bloody stools, change in bowel habits, constipation, dark tarry stools, gas, hemorrhoids, indigestion, loss of appetite, vomiting, and yellowish skin color.  Physical Exam  General:  alert, well-developed, well-nourished, well-hydrated, appropriate dress, normal appearance, healthy-appearing, and overweight-appearing.   Head:  normocephalic, atraumatic, no abnormalities observed, and no abnormalities palpated.   Eyes:  vision grossly intact, pupils equal, and no injection and no icterus.   Mouth:  Oral mucosa and oropharynx without lesions or exudates.  Teeth in good repair. Neck:  supple, full ROM, no masses, no thyromegaly, no JVD, no carotid bruits, no cervical lymphadenopathy, and no neck tenderness.   Lungs:  normal respiratory effort, no intercostal retractions, no accessory muscle use, normal breath sounds, no dullness, no fremitus, no crackles, and no wheezes.   Heart:  normal rate, regular rhythm, no murmur, no gallop, no rub, and no JVD.   Abdomen:  soft, non-tender, normal bowel sounds, no distention, no masses, no guarding, no rigidity, no rebound tenderness, no abdominal hernia, no inguinal hernia, no hepatomegaly, and no splenomegaly.   Msk:  No  deformity or scoliosis noted of thoracic or lumbar spine.   Pulses:  R and L carotid,radial,femoral,dorsalis pedis and posterior tibial pulses are full and equal bilaterally Extremities:  1+ left pedal edema and 1+ right pedal edema and very faint erythema over both lower legs with no exudate or streaking. Neurologic:  No cranial nerve deficits noted. Station and gait are normal. Plantar reflexes are down-going bilaterally. DTRs are symmetrical throughout. Sensory, motor and coordinative functions appear intact. Skin:  Intact without suspicious lesions or rashes Cervical Nodes:  no anterior  cervical adenopathy and no posterior cervical adenopathy.   Psych:  Cognition and judgment appear intact. Alert and cooperative with normal attention span and concentration. No apparent delusions, illusions, hallucinations   Impression & Recommendations:  Problem # 1:  DIARRHEA (ICD-787.91) Assessment New  Her updated medication list for this problem includes:    Align 4 Mg Caps (Probiotic product) ..... One by mouth once daily  Orders: T-C diff by PCR (57322)  Problem # 2:  DIVERTICULOSIS OF COLON (ICD-562.10) Assessment: Improved  Problem # 3:  RENAL INSUFFICIENCY (ICD-588.9) Assessment: Improved  Problem # 4:  EDEMA (ICD-782.3) Assessment: Improved  Her updated medication list for this problem includes:    Diovan Hct 160-25 Mg Tabs (Valsartan-hydrochlorothiazide) .Marland Kitchen... Take 1 tablet by mouth once a day  Problem # 5:  UTI (ICD-599.0) Assessment: Improved  Encouraged to push clear liquids, get enough rest, and take acetaminophen as needed. To be seen in 10 days if no improvement, sooner if worse.  Problem # 6:  DIVERTICULITIS, COLON (ICD-562.11) Assessment: Improved  Complete Medication List: 1)  Aspirin Low Strength 81 Mg Chew (Aspirin) .... Take 1 tablet by mouth once a day 2)  Benefiber Powd (Wheat dextrin) .... Use in cereal in morning 3)  Allopurinol 100 Mg Tabs (Allopurinol) ....  One by mouth once daily for gout 4)  Spiriva Handihaler 18 Mcg Caps (Tiotropium bromide monohydrate) .... One puff once daily 5)  Tramadol Hcl 50 Mg Tabs (Tramadol hcl) .Marland Kitchen.. 1-2 by mouth qid as needed for pain 6)  Proair Hfa 108 (90 Base) Mcg/act Aers (Albuterol sulfate) .... Two times a day 7)  Diovan Hct 160-25 Mg Tabs (Valsartan-hydrochlorothiazide) .... Take 1 tablet by mouth once a day 8)  Ketoconazole 2 % Crea (Ketoconazole) .... Apply to areas in groin two times a day for 14 days 9)  Align 4 Mg Caps (Probiotic product) .... One by mouth once daily 10)  Ondansetron Hcl 4 Mg Tabs (Ondansetron hcl) .... One by mouth qid as needed for nausea  Patient Instructions: 1)  Please schedule a follow-up appointment in 1 month. 2)  teh main problem with gastroenteritis is dehydration. Drink plenty of fluids and take solids as you feel better. If you are unable to keep anything down and/or you show signs of dehydration(dry/cracked lips, lack of tears, not urinating, very sleepy), call our office. Prescriptions: ONDANSETRON HCL 4 MG TABS (ONDANSETRON HCL) one by mouth QID as needed for nausea  #35 x 2   Entered and Authorized by:   [name] MD   Signed by:   [name] MD on 08/10/2010   Method used:   Electronically to        [facility]. [address]. [phone]* (retail)       [address], [place]  [address]       Ph: [phone] or [phone]       Fax: [phone]   RxID:   [id number] ALIGN 4 MG CAPS (PROBIOTIC PRODUCT) One by mouth once daily  #28 x 0   Entered and Authorized by:   [name] MD   Signed by:   [name] MD on 08/10/2010   Method used:   Samples Given   RxID:   [id number]    Orders Added: 1)  T-C diff by PCR [81755] 2)  Est. Patient Level IV [85277]

## 2010-08-31 NOTE — Miscellaneous (Signed)
Summary: Face to face encounter/[facility]  Face to face encounter/[facility]   Imported By: Phillis Knack 07/14/2010 11:39:57  _____________________________________________________________________  External Attachment:    Type:   Image     Comment:   External Document

## 2010-08-31 NOTE — Assessment & Plan Note (Signed)
Summary: BLOOD IN URINE/NWS   Vital Signs:  Patient profile:   70 year old female Menstrual status:  postmenopausal Height:      63 inches (160.02 cm) Weight:      214.25 pounds (97.39 kg) BMI:     38.09 O2 Sat:      96 % on Room air Temp:     98.7 degrees F (37.06 degrees C) oral Pulse rate:   79 / minute Pulse rhythm:   regular Resp:     16 per minute BP sitting:   132 / 82  (left arm) Cuff size:   large  Vitals Entered By: Rebeca Alert CMA Deborra Medina) (July 27, 2010 2:20 PM)  Nutrition Counseling: Patient's BMI is greater than 25 and therefore counseled on weight management options.  O2 Flow:  Room air CC: Pt c/o spotting x 2 days, blood in urine, pain in lower abdomen, N+V this morning/aj,  Abdominal pain Is Patient Diabetic? No   Primary Care Provider:  [name] MD  CC:  Pt c/o spotting x 2 days, blood in urine, pain in lower abdomen, N+V this morning/aj, and Abdominal pain.  History of Present Illness:  Abdominal Pain      This is a 70 year old woman who presents with Abdominal pain.  The symptoms began 2 days ago.  On a scale of 1 to 10, the intensity is described as a 2.  The patient reports nausea, but denies vomiting, diarrhea, constipation, melena, hematochezia, anorexia, and hematemesis.  The location of the pain is left lower quadrant.  The pain is described as constant, burning in quality, and cramping in quality.  Associated symptoms include vaginal bleeding.  The patient denies the following symptoms: fever, weight loss, dysuria, chest pain, jaundice, dark urine, and missed menstrual period.  The pain is worse with movement.    Preventive Screening-Counseling & Management  Alcohol-Tobacco     Alcohol drinks/day: 0     Alcohol Counseling: not indicated; patient does not drink     Smoking Status: quit     Packs/Day: 1998 15 pyh     Year Quit: 08/1996     Pack years: 30+     Passive Smoke Exposure: no     Tobacco Counseling: to remain off tobacco  products  Hep-HIV-STD-Contraception     Hepatitis Risk: no risk noted     HIV Risk: no risk noted     STD Risk: no risk noted      Drug Use:  no.    Clinical Review Panels:  Prevention   Last Mammogram:  Normal (09/28/2005)   Last Pap Smear:  Normal, Satisfactory (02/02/2008)   Last Colonoscopy:  Location:  [facility].  (02/16/2008)  Immunizations   Last Tetanus Booster:  Td (10/30/2001)   Last Flu Vaccine:  Historical (04/29/2010)   Last Pneumovax:  Pneumovax (11/07/2006)  Lipid Management   Cholesterol:  224 (08/22/2009)   LDL (bad choesterol):  111 (09/19/2007)   HDL (good cholesterol):  46.20 (08/22/2009)  Diabetes Management   HgBA1C:  6.3 (03/22/2010)   Creatinine:  1.2 (06/20/2010)   Last Flu Vaccine:  Historical (04/29/2010)   Last Pneumovax:  Pneumovax (11/07/2006)  CBC   WBC:  14.9 (03/22/2010)   RBC:  4.46 (03/22/2010)   Hgb:  12.6 (03/22/2010)   Hct:  37.3 (03/22/2010)   Platelets:  238.0 (03/22/2010)   MCV  83.6 (03/22/2010)   MCHC  33.8 (03/22/2010)   RDW  15.5 (03/22/2010)   PMN:  73.6 (03/22/2010)   Lymphs:  17.0 (03/22/2010)   Monos:  7.7 (03/22/2010)   Eosinophils:  1.1 (03/22/2010)   Basophil:  0.6 (03/22/2010)  Complete Metabolic Panel   Glucose:  107 (06/20/2010)   Sodium:  137 (06/20/2010)   Potassium:  4.4 (06/20/2010)   Chloride:  96 (06/20/2010)   CO2:  32 (06/20/2010)   BUN:  21 (06/20/2010)   Creatinine:  1.2 (06/20/2010)   Albumin:  3.5 (03/22/2010)   Total Protein:  7.1 (03/22/2010)   Calcium:  10.0 (06/20/2010)   Total Bili:  0.4 (03/22/2010)   Alk Phos:  84 (03/22/2010)   SGPT (ALT):  14 (03/22/2010)   SGOT (AST):  15 (03/22/2010)   Medications Prior to Update: 1)  Aspirin Low Strength 81 Mg Chew (Aspirin) .... Take 1 Tablet By Mouth Once A Day 2)  Benefiber  Powd (Wheat Dextrin) .... Use in Cereal in Morning 3)  Allopurinol 100 Mg Tabs (Allopurinol) .... One By Mouth Once Daily For Gout 4)  Spiriva  Handihaler 18 Mcg Caps (Tiotropium Bromide Monohydrate) .... One Puff Once Daily 5)  Tramadol Hcl 50 Mg Tabs (Tramadol Hcl) .Marland Kitchen.. 1-2 By Mouth Qid As Needed For Pain 6)  Proair Hfa 108 (90 Base) Mcg/act Aers (Albuterol Sulfate) .... Two Times A Day 7)  Diovan Hct 160-25 Mg Tabs (Valsartan-Hydrochlorothiazide) .... Take 1 Tablet By Mouth Once A Day  Current Medications (verified): 1)  Aspirin Low Strength 81 Mg Chew (Aspirin) .... Take 1 Tablet By Mouth Once A Day 2)  Benefiber  Powd (Wheat Dextrin) .... Use in Cereal in Morning 3)  Allopurinol 100 Mg Tabs (Allopurinol) .... One By Mouth Once Daily For Gout 4)  Spiriva Handihaler 18 Mcg Caps (Tiotropium Bromide Monohydrate) .... One Puff Once Daily 5)  Tramadol Hcl 50 Mg Tabs (Tramadol Hcl) .Marland Kitchen.. 1-2 By Mouth Qid As Needed For Pain 6)  Proair Hfa 108 (90 Base) Mcg/act Aers (Albuterol Sulfate) .... Two Times A Day 7)  Diovan Hct 160-25 Mg Tabs (Valsartan-Hydrochlorothiazide) .... Take 1 Tablet By Mouth Once A Day 8)  Cipro 500 Mg Tab (Ciprofloxacin Hcl) .... Take 1 Tablet By Mouth Morning and Night X 10 Days 9)  Metronidazole 500 Mg Tabs (Metronidazole) .... One By Mouth Three Times A Day  Allergies (verified): 1)  ! Codeine Sulfate (Codeine Sulfate) 2)  ! Amoxicillin (Amoxicillin) 3)  ! Hydrocodone-Acetaminophen (Hydrocodone-Acetaminophen) 4)  ! Beta Blockers 5)  ! Prednisone 6)  ! Hydrochlorothiazide 7)  ! Amlodipine Besylate  Past History:  Past Medical History: Last updated: 06/20/2010 Hypertension (07/30/1988) GERD (07/30/1992) COPD (01/27/1998) Hyperlipidemia (11/27/1996) Congestive heart failure (08/31/2003) Gout Renal insufficiency  Past Surgical History: Last updated: 11/17/2008 [place]  CT Chest nml 04/1998 Colonosc polyp rectosig ?IBS 11/1993 Abd U/S Bil Sludge 10/92 ECHO EF nml  Tr TR mild LVH 12/1999 Colonoscopy int hemms no polyps 12/24/2001         1yr ECHO no change EF nml  12/17/2003 Adenosine Myoview nml EF 71% 11/29/2004 LE U/S nml  Carotid U/S nml  11/28/2004 Colonoscopy Polyp Diverticulosis (Dr PSharlett Iles 02/16/2008    3 yrs No surgeries  Family History: Last updated: 03/19/2008 Father dec 56 Stroke Mother dec 65 CVA Ca DM Brother dec  Brother dec  Brother dec Brother A RF at [employer] [place] DM Sister A Cerv CA CATH Stent Sister A Lung Ca, s/p lobectomy, mets to brain, radiation, recurr to lungs Sister A Mother had kidney cancer Family History of  Stomach Cancer: Mother (mets from kidneys) No FH of Colon Cancer: Family History of Clotting disorder: Sister Family History of Colitis/Crohn's: Brother (colitis) Family History of Heart Disease: Mother, Brother x 2, sister  Social History: Last updated: 03/19/2008 Occupation: [employer] Married 3 children Former Smoker-1998 stopped Alcohol use-no Drug use-no Daily Caffeine Use-4 cups daily Patient does not get regular exercise.   Risk Factors: Alcohol Use: 0 (07/27/2010) Caffeine Use: 1 (09/24/2007) Exercise: no (03/19/2008)  Risk Factors: Smoking Status: quit (07/27/2010) Packs/Day: 1998 15 pyh (07/27/2010) Passive Smoke Exposure: no (07/27/2010)  Family History: Reviewed history from 03/19/2008 and no changes required. Father dec 56 Stroke Mother dec 65 CVA Ca DM Brother dec  Brother dec  Brother dec Brother A RF at [employer] dec [place] DM Sister A Cerv CA CATH Stent Sister A Lung Ca, s/p lobectomy, mets to brain, radiation, recurr to lungs Sister A Mother had kidney cancer Family History of Stomach Cancer: Mother (mets from kidneys) No FH of Colon Cancer: Family History of Clotting disorder: Sister Family History of Colitis/Crohn's: Brother (colitis) Family History of Heart Disease: Mother, Brother x 2, sister  Social History: Reviewed history from 03/19/2008 and no changes required. Occupation: [employer] Married 3 children Former Smoker-1998  stopped Alcohol use-no Drug use-no Daily Caffeine Use-4 cups daily Patient does not get regular exercise.   Review of Systems       The patient complains of weight gain and hematuria.  The patient denies anorexia, fever, weight loss, chest pain, syncope, dyspnea on exertion, peripheral edema, prolonged cough, headaches, hemoptysis, melena, hematochezia, severe indigestion/heartburn, incontinence, genital sores, muscle weakness, suspicious skin lesions, difficulty walking, depression, unusual weight change, abnormal bleeding, enlarged lymph nodes, angioedema, and breast masses.   GI:  Complains of abdominal pain and nausea; denies bloody stools, change in bowel habits, constipation, dark tarry stools, diarrhea, excessive appetite, hemorrhoids, loss of appetite, vomiting, and yellowish skin color. GU:  Complains of abnormal vaginal bleeding and hematuria; denies decreased libido, discharge, dysuria, incontinence, nocturia, urinary frequency, and urinary hesitancy. Derm:  Complains of itching and rash; denies changes in color of skin, changes in nail beds, dryness, excessive perspiration, flushing, lesion(s), and poor wound healing.  Physical Exam  General:  alert, well-developed, well-nourished, well-hydrated, appropriate dress, normal appearance, healthy-appearing, cooperative to examination, good hygiene, and overweight-appearing.   Head:  normocephalic, atraumatic, no abnormalities observed, and no abnormalities palpated.   Eyes:  vision grossly intact, pupils equal, and no injection or icterus.   Ears:  R ear normal and L ear normal.   Mouth:  Oral mucosa and oropharynx without lesions or exudates.  Teeth in good repair. Neck:  supple, full ROM, no masses, no thyromegaly, no JVD, no carotid bruits, no cervical lymphadenopathy, and no neck tenderness.   Lungs:  normal respiratory effort, no intercostal retractions, no accessory muscle use, normal breath sounds, no dullness, no fremitus, no  crackles, and no wheezes.   Heart:  normal rate, regular rhythm, no murmur, no gallop, no rub, and no JVD.   Abdomen:  soft, no distention, no masses, no guarding, no rigidity, no rebound tenderness, no inguinal hernia, no hepatomegaly, no splenomegaly, bowel sounds hypoactive, umbilical hernia, and LLQ tenderness.  in the intertriginous areas under her pannus there is macerated erythema that is well demarcated and has a foul odor. it is covered with powder. Rectal:  No external abnormalities noted. Normal sphincter tone. No rectal masses or tenderness. Genitalia:  normal introitus, no external lesions, no vaginal  discharge or bleeding, mucosa pink and moist, no vaginal or cervical lesions, no vaginal atrophy, no friaility or hemorrhage, normal uterus size and position, no adnexal masses or tenderness, and vaginal atrophy.  no FB seen. Msk:  left and right anterior lower legs have dark red induration, but no weeping, erythema, or foul-smelling scant exudate and no induration, streaking, fluctuance, or abscess formation.  Pulses:  R and L carotid,radial,femoral,dorsalis pedis and posterior tibial pulses are full and equal bilaterally Extremities:  1+ left pedal edema and 1+ right pedal edema.   Neurologic:  No cranial nerve deficits noted. Station and gait are normal. Plantar reflexes are down-going bilaterally. DTRs are symmetrical throughout. Sensory, motor and coordinative functions appear intact. Skin:  Intact without suspicious lesions or rashes Cervical Nodes:  no anterior cervical adenopathy and no posterior cervical adenopathy.   Psych:  Cognition and judgment appear intact. Alert and cooperative with normal attention span and concentration. No apparent delusions, illusions, hallucinations   Impression & Recommendations:  Problem # 1:  ABDOMINAL PAIN, GENERALIZED (ICD-789.07) Assessment New will look for secondary causes, though I think this is an episode of  diverticulitis Orders: Venipuncture (46270) T-Urine Culture (Spectrum Order) [phone]) T-Acute Abdomen (2 view w/ PA & Chest (99371IR) TLB-BMP (Basic Metabolic Panel-BMET) ([id number]) TLB-CBC Platelet - w/Differential (85025-CBCD) TLB-Hepatic/Liver Function Pnl (80076-HEPATIC) TLB-Amylase (82150-AMYL) TLB-Lipase (83690-LIPASE) TLB-Udip w/ Micro (81001-URINE) DRE (W2585)  Problem # 2:  DIVERTICULOSIS OF COLON (ICD-562.10) Assessment: New  start cipro and flagyl  Colonoscopy:  Labs Reviewed: Hgb: 12.6 (03/22/2010)   Hct: 37.3 (03/22/2010)   WBC: 14.9 (03/22/2010)  Problem # 3:  UTI (ICD-599.0) Assessment: Deteriorated  Her updated medication list for this problem includes:    Cipro 500 Mg Tab (Ciprofloxacin hcl) .Marland Kitchen... Take 1 tablet by mouth morning and night x 10 days    Metronidazole 500 Mg Tabs (Metronidazole) ..... One by mouth three times a day  Orders: Venipuncture (27782) T-Urine Culture (Spectrum Order) [id number]) TLB-BMP (Basic Metabolic Panel-BMET) ([id number]) TLB-CBC Platelet - w/Differential (85025-CBCD) TLB-Hepatic/Liver Function Pnl (80076-HEPATIC) TLB-Amylase (82150-AMYL) TLB-Lipase (83690-LIPASE) TLB-Udip w/ Micro (81001-URINE)  Problem # 4:  RENAL INSUFFICIENCY (ICD-588.9) Assessment: Unchanged  Orders: Venipuncture (09326) T-Urine Culture (Spectrum Order) ([id number]) TLB-BMP (Basic Metabolic Panel-BMET) ([id number]) TLB-CBC Platelet - w/Differential (85025-CBCD) TLB-Hepatic/Liver Function Pnl (80076-HEPATIC) TLB-Amylase (82150-AMYL) TLB-Lipase (83690-LIPASE) TLB-Udip w/ Micro (81001-URINE)  Problem # 5:  CELLULITIS, LEFT LEG (ICD-682.6) Assessment: Unchanged  Her updated medication list for this problem includes:    Cipro 500 Mg Tab (Ciprofloxacin hcl) .Marland Kitchen... Take 1 tablet by mouth morning and night x 10 days    Metronidazole 500 Mg Tabs (Metronidazole) ..... One by mouth three times a day  Elevate affected area. Warm moist  compresses for 20 minutes every 2 hours while awake. Take antibiotics as directed and take acetaminophen as needed. To be seen in 48-72 hours if no improvement, sooner if worse.  Problem # 6:  TINEA CRURIS (ICD-110.3) Assessment: New start ketoconazole cream as directed  Problem # 7:  DYSFUNCTIONAL UTERINE BLEEDING (ICD-626.8) Assessment: New  the pelvic exam is normal with no evidence of bleeding, will check the PAP smear results  Orders: Obtaining Screening PAP Smear (H4193) Pelvic & Breast Exam ( Medicare)  (X9024) DRE (O9735)  Complete Medication List: 1)  Aspirin Low Strength 81 Mg Chew (Aspirin) .... Take 1 tablet by mouth once a day 2)  Benefiber Powd (Wheat dextrin) .... Use in cereal in morning 3)  Allopurinol 100 Mg Tabs (Allopurinol) .... One by mouth once daily for  gout 4)  Spiriva Handihaler 18 Mcg Caps (Tiotropium bromide monohydrate) .... One puff once daily 5)  Tramadol Hcl 50 Mg Tabs (Tramadol hcl) .Marland Kitchen.. 1-2 by mouth qid as needed for pain 6)  Proair Hfa 108 (90 Base) Mcg/act Aers (Albuterol sulfate) .... Two times a day 7)  Diovan Hct 160-25 Mg Tabs (Valsartan-hydrochlorothiazide) .... Take 1 tablet by mouth once a day 8)  Cipro 500 Mg Tab (Ciprofloxacin hcl) .... Take 1 tablet by mouth morning and night x 10 days 9)  Metronidazole 500 Mg Tabs (Metronidazole) .... One by mouth three times a day 10)  Ketoconazole 2 % Crea (Ketoconazole) .... Apply to areas in groin two times a day for 14 days  Other Orders: Radiology Referral (Radiology)  Colorectal Screening:  Current Recommendations:    Hemoccult: NEG X 1 today  PAP Screening:    Hx Cervical Dysplasia in last 5 yrs? No    3 normal PAP smears in last 5 yrs? Yes    Last PAP smear:  02/02/2008    Reviewed PAP smear recommendations:  PAP smear done  Mammogram Screening:    Last Mammogram:  09/28/2005    Reviewed Mammogram recommendations:  mammogram ordered  Osteoporosis Risk Assessment:  Risk Factors for  Fracture or Low Bone Density:   Race (White or Asian):     yes   Smoking status:       quit  Immunization & Chemoprophylaxis:    Tetanus vaccine: Td  (10/30/2001)    Influenza vaccine: Historical  (04/29/2010)    Pneumovax: Pneumovax  (11/07/2006)  Patient Instructions: 1)  Please schedule a follow-up appointment in 2 weeks. 2)  It is important that you exercise regularly at least 20 minutes 5 times a week. If you develop chest pain, have severe difficulty breathing, or feel very tired , stop exercising immediately and seek medical attention. 3)  You need to lose weight. Consider a lower calorie diet and regular exercise.  4)  Take your antibiotic as prescribed until ALL of it is gone, but stop if you develop a rash or swelling and contact our office as soon as possible. Prescriptions: KETOCONAZOLE 2 % CREA (KETOCONAZOLE) Apply to areas in groin two times a day for 14 days  #60 gms x 5   Entered and Authorized by:   [name] MD   Signed by:   [name] MD on 07/28/2010   Method used:   Electronically to        [facility]. [address] [phone]* (retail)       [address], [place]  [address]       Ph: [phone] or [phone]       Fax: [phone]   RxID:   [id number] METRONIDAZOLE 500 MG TABS (METRONIDAZOLE) One by mouth three times a day  #30 x 0   Entered and Authorized by:   [name] MD   Signed by:   [name] MD on 07/27/2010   Method used:   Electronically to        [facility]. [address] [phone]* (retail)       [address]       [place], [place]  [address]       Ph: [phone] or [phone]       Fax: [phone]   RxID:  [id number] CIPRO 500 MG TAB (CIPROFLOXACIN HCL) Take 1 tablet by mouth morning and night X 10 days  #20 x 0   Entered and Authorized by:   [name] MD   Signed by:   [name] MD on 07/27/2010   Method used:   Electronically to        [facility].  [address] [phone]* (retail)       [address], [place]  [address]       Ph: [phone] or [phone]       Fax: [phone]   RxID:   [id number]    Orders Added: 1)  Venipuncture [41287] 2)  T-Urine Culture (Spectrum Order) [[id number]] 3)  T-Acute Abdomen (2 view w/ PA & Chest [74022TC] 4)  TLB-BMP (Basic Metabolic Panel-BMET) [09628-ZMOQHUT] 5)  TLB-CBC Platelet - w/Differential [85025-CBCD] 6)  TLB-Hepatic/Liver Function Pnl [80076-HEPATIC] 7)  TLB-Amylase [82150-AMYL] 8)  TLB-Lipase [83690-LIPASE] 9)  TLB-Udip w/ Micro [81001-URINE] 10)  Radiology Referral [Radiology] 11)  Obtaining Screening PAP Smear [Q0091] 12)  Pelvic & Breast Exam ( Medicare)  [M5465] 03)  DRE [G0102] 14)  Est. Patient Level IV [54656]   Immunization History:  Influenza Immunization History:    Influenza:  historical (04/29/2010)    Influenza:  state fluvax nasal (04/29/2010)    Influenza:  historical (04/29/2010)    Influenza:  state fluvax nasal (04/29/2010)   Immunization History:  Influenza Immunization History:    Influenza:  State Fluvax Nasal (04/29/2010)    Influenza:  Historical (04/29/2010)

## 2010-09-19 ENCOUNTER — Encounter: Payer: Self-pay | Admitting: Internal Medicine

## 2010-09-20 ENCOUNTER — Telehealth: Payer: Self-pay | Admitting: Internal Medicine

## 2010-09-26 NOTE — Progress Notes (Signed)
    PAP Screening:    Last PAP smear:  02/02/2008  Mammogram Screening:    Last Mammogram:  09/19/2010  Mammogram Results:    Date of Exam:  09/19/2010    Results:  Normal Bilateral  Osteoporosis Risk Assessment:  Risk Factors for Fracture or Low Bone Density:   Race (White or Asian):     yes   Smoking status:       quit  Immunization & Chemoprophylaxis:    Tetanus vaccine: Td  (10/30/2001)    Influenza vaccine: Historical  (04/29/2010)    Pneumovax: Pneumovax  (11/07/2006)

## 2010-10-10 LAB — CULTURE, BLOOD (ROUTINE X 2)
Culture  Setup Time: 201111111802
Culture: NO GROWTH

## 2010-10-10 LAB — RENAL FUNCTION PANEL
CO2: 31 mEq/L (ref 19–32)
Chloride: 98 mEq/L (ref 96–112)
Creatinine, Ser: 1.01 mg/dL (ref 0.4–1.2)
GFR calc Af Amer: >60 mL/min (ref >60)
GFR calc non Af Amer: 54 mL/min — ABNORMAL LOW (ref >60)
Glucose, Bld: 122 mg/dL — ABNORMAL HIGH (ref 70–99)

## 2010-10-10 LAB — BASIC METABOLIC PANEL
BUN: 15 mg/dL (ref 6–23)
BUN: 16 mg/dL (ref 6–23)
BUN: 17 mg/dL (ref 6–23)
CO2: 30 mEq/L (ref 19–32)
CO2: 30 mEq/L (ref 19–32)
CO2: 31 mEq/L (ref 19–32)
Chloride: 101 mEq/L (ref 96–112)
Chloride: 103 mEq/L (ref 96–112)
Chloride: 96 mEq/L (ref 96–112)
Creatinine, Ser: 1.04 mg/dL (ref 0.4–1.2)
GFR calc Af Amer: 58 mL/min — ABNORMAL LOW (ref >60)
GFR calc non Af Amer: 45 mL/min — ABNORMAL LOW (ref >60)
Glucose, Bld: 103 mg/dL — ABNORMAL HIGH (ref 70–99)
Glucose, Bld: 85 mg/dL (ref 70–99)
Potassium: 3.6 mEq/L (ref 3.5–5.1)
Potassium: 4.2 mEq/L (ref 3.5–5.1)
Sodium: 136 mEq/L (ref 135–145)

## 2010-10-10 LAB — CBC
HCT: 32.7 % — ABNORMAL LOW (ref 36.0–46.0)
HCT: 35.7 % — ABNORMAL LOW (ref 36.0–46.0)
HCT: 36.8 % (ref 36.0–46.0)
Hemoglobin: 11.5 g/dL — ABNORMAL LOW (ref 12.0–15.0)
Hemoglobin: 11.5 g/dL — ABNORMAL LOW (ref 12.0–15.0)
MCH: 26.9 pg (ref 26.0–34.0)
MCHC: 31.3 g/dL (ref 30.0–36.0)
MCV: 84.7 fL (ref 78.0–100.0)
RBC: 3.86 MIL/uL — ABNORMAL LOW (ref 3.87–5.11)
RBC: 4.28 MIL/uL (ref 3.87–5.11)
RBC: 4.46 MIL/uL (ref 3.87–5.11)
WBC: 9.7 10*3/uL (ref 4.0–10.5)

## 2010-10-10 LAB — POCT I-STAT, CHEM 8
BUN: 17 mg/dL (ref 6–23)
Chloride: 100 mEq/L (ref 96–112)
Creatinine, Ser: 1.2 mg/dL (ref 0.4–1.2)
Glucose, Bld: 137 mg/dL — ABNORMAL HIGH (ref 70–99)
Hemoglobin: 13.3 g/dL (ref 12.0–15.0)
Potassium: 3.9 mEq/L (ref 3.5–5.1)
Sodium: 134 mEq/L — ABNORMAL LOW (ref 135–145)

## 2010-10-10 LAB — DIFFERENTIAL
Basophils Relative: 0 % (ref 0–1)
Eosinophils Relative: 4 % (ref 0–5)
Lymphocytes Relative: 18 % (ref 12–46)
Lymphocytes Relative: 21 % (ref 12–46)
Lymphs Abs: 1.9 10*3/uL (ref 0.7–4.0)
Lymphs Abs: 2 10*3/uL (ref 0.7–4.0)
Monocytes Absolute: 1 10*3/uL (ref 0.1–1.0)
Monocytes Absolute: 1.1 10*3/uL — ABNORMAL HIGH (ref 0.1–1.0)
Monocytes Relative: 10 % (ref 3–12)
Neutro Abs: 7 10*3/uL (ref 1.7–7.7)
Neutrophils Relative %: 68 % (ref 43–77)

## 2010-11-20 ENCOUNTER — Other Ambulatory Visit (INDEPENDENT_AMBULATORY_CARE_PROVIDER_SITE_OTHER): Payer: Medicare Other

## 2010-11-20 ENCOUNTER — Ambulatory Visit (INDEPENDENT_AMBULATORY_CARE_PROVIDER_SITE_OTHER): Payer: Medicare Other | Admitting: Internal Medicine

## 2010-11-20 ENCOUNTER — Encounter: Payer: Self-pay | Admitting: Internal Medicine

## 2010-11-20 ENCOUNTER — Other Ambulatory Visit (INDEPENDENT_AMBULATORY_CARE_PROVIDER_SITE_OTHER): Payer: Medicare Other | Admitting: Internal Medicine

## 2010-11-20 DIAGNOSIS — N259 Disorder resulting from impaired renal tubular function, unspecified: Secondary | ICD-10-CM

## 2010-11-20 DIAGNOSIS — R7989 Other specified abnormal findings of blood chemistry: Secondary | ICD-10-CM

## 2010-11-20 DIAGNOSIS — E785 Hyperlipidemia, unspecified: Secondary | ICD-10-CM

## 2010-11-20 DIAGNOSIS — M109 Gout, unspecified: Secondary | ICD-10-CM

## 2010-11-20 DIAGNOSIS — R609 Edema, unspecified: Secondary | ICD-10-CM

## 2010-11-20 DIAGNOSIS — N390 Urinary tract infection, site not specified: Secondary | ICD-10-CM

## 2010-11-20 DIAGNOSIS — I509 Heart failure, unspecified: Secondary | ICD-10-CM

## 2010-11-20 DIAGNOSIS — I10 Essential (primary) hypertension: Secondary | ICD-10-CM

## 2010-11-20 DIAGNOSIS — K5150 Left sided colitis without complications: Secondary | ICD-10-CM

## 2010-11-20 LAB — LIPID PANEL
HDL: 49.6 mg/dL (ref >39.00)
Total CHOL/HDL Ratio: 4
VLDL: 30.2 mg/dL (ref 0.0–40.0)

## 2010-11-20 LAB — CBC WITH DIFFERENTIAL/PLATELET
Basophils Relative: 0.5 % (ref 0.0–3.0)
Eosinophils Absolute: 0.4 10*3/uL (ref 0.0–0.7)
Lymphocytes Relative: 22.9 % (ref 12.0–46.0)
MCHC: 33.9 g/dL (ref 30.0–36.0)
Neutrophils Relative %: 64.3 % (ref 43.0–77.0)
RBC: 4.65 Mil/uL (ref 3.87–5.11)
WBC: 10.3 10*3/uL (ref 4.5–10.5)

## 2010-11-20 LAB — COMPREHENSIVE METABOLIC PANEL
AST: 18 U/L (ref 0–37)
Albumin: 3.6 g/dL (ref 3.5–5.2)
Alkaline Phosphatase: 80 U/L (ref 39–117)
Glucose, Bld: 87 mg/dL (ref 70–99)
Potassium: 4.2 mEq/L (ref 3.5–5.1)
Sodium: 136 mEq/L (ref 135–145)
Total Protein: 6.9 g/dL (ref 6.0–8.3)

## 2010-11-20 LAB — TSH: TSH: 1.7 u[IU]/mL (ref 0.35–5.50)

## 2010-11-20 NOTE — Assessment & Plan Note (Signed)
Check her A1C level today to screen for DM

## 2010-11-20 NOTE — Patient Instructions (Signed)
Gout Gout is an inflammatory condition (arthritis) caused by a buildup of uric acid crystals in the joints. Uric acid is a chemical that is normally present in the blood. Under some circumstances, uric acid can form into crystals in your joints. This causes joint redness, soreness, and swelling (inflammation). Repeat attacks are common. Over time, uric acid crystals can form into masses (tophi) near a joint, causing disfigurement. Gout is treatable and often preventable. CAUSES The disease begins with elevated levels of uric acid in the blood. Uric acid is produced by your body when it breaks down a naturally found substance called purines. This also happens when you eat certain foods such as meats and fish. Causes of an elevated uric acid level include:  Being passed down from parent to child (heredity).   Diseases that cause increased uric acid production (obesity, psoriasis, some cancers).   Excessive alcohol use.   Diet, especially diets rich in meat and seafood.   Medicines, including certain cancer-fighting drugs (chemotherapy), diuretics, and aspirin.   Chronic kidney disease. The kidneys are no longer able to remove uric acid well.   Problems with metabolism.  Conditions strongly associated with gout include:  Obesity.   High blood pressure.   High cholesterol.   Diabetes.  Not everyone with elevated uric acid levels gets gout. It is not understood why some people get gout and others do not. Surgery, joint injury, and eating too much of certain foods are some of the factors that can lead to gout. SYMPTOMS  An attack of gout comes on quickly. It causes intense pain with redness, swelling, and warmth in a joint.   Fever can occur.   Often, only one joint is involved. Certain joints are more commonly involved:   Base of the big toe.   Knee.   Ankle.   Wrist.   Finger.  Without treatment, an attack usually goes away in a few days to weeks. Between attacks, you usually  will not have symptoms, which is different from many other forms of arthritis. DIAGNOSIS Your caregiver will suspect gout based on your symptoms and exam. Removal of fluid from the joint (arthrocentesis) is done to check for uric acid crystals. Your caregiver will give you a medicine that numbs the area (local anesthetic) and use a needle to remove joint fluid for exam. Gout is confirmed when uric acid crystals are seen in joint fluid, using a special microscope. Sometimes, blood, urine, and X-ray tests are also used. TREATMENT There are 2 phases to gout treatment: treating the sudden onset (acute) attack and preventing attacks (prophylaxis). Treatment of an Acute Attack  Medicines are used. These include anti-inflammatory medicines or steroid medicines.   An injection of steroid medicine into the affected joint is sometimes necessary.   The painful joint is rested. Movement can worsen the arthritis.   You may use warm or cold treatments on painful joints, depending which works best for you.   Discuss the use of coffee, vitamin C, or cherries with your caregiver. These may be helpful treatment options.  Treatment to Prevent Attacks After the acute attack subsides, your caregiver may advise prophylactic medicine. These medicines either help your kidneys eliminate uric acid from your body or decrease your uric acid production. You may need to stay on these medicines for a very long time. The early phase of treatment with prophylactic medicine can be associated with an increase in acute gout attacks. For this reason, during the first few months of treatment, your caregiver  may also advise you to take medicines usually used for acute gout treatment. Be sure you understand your caregiver's directions. You should also discuss dietary treatment with your caregiver. Certain foods such as meats and fish can increase uric acid levels. Other foods such as dairy can decrease levels. Your caregiver can give  you a list of foods to avoid. HOME CARE INSTRUCTIONS  Do not take aspirin to relieve pain. This raises uric acid levels.   Only take over-the-counter or prescription medicines for pain, discomfort, or fever as directed by your caregiver.   Rest the joint as much as possible. When in bed, keep sheets and blankets off painful areas.   Keep the affected joint raised (elevated).   Use crutches if the painful joint is in your leg.   Drink enough water and fluids to keep your urine clear or pale yellow. This helps your body get rid of uric acid. Do not drink alcoholic beverages. They slow the passage of uric acid.   Follow your caregiver's dietary instructions. Pay careful attention to the amount of protein you eat. Your daily diet should emphasize fruits, vegetables, whole grains, and fat-free or low-fat milk products.   Maintain a healthy body weight.  SEEK MEDICAL CARE IF:  You have an oral temperature above 100.5.   You develop diarrhea, vomiting, or any side effects from medicines.   You do not feel better in 24 hours, or you are getting worse.  SEEK IMMEDIATE MEDICAL CARE IF:  Your joint becomes suddenly more tender and you have:   Chills.  An oral temperature above 100.5Hypertension (High Blood Pressure) As your heart beats, it forces blood through your arteries. This force is your blood pressure. If the pressure is too high, it is called hypertension (HTN) or high blood pressure. HTN is dangerous because you may have it and not know it. High blood pressure may mean that your heart has to work harder to pump blood. Your arteries may be narrow or stiff. The extra work puts you at risk for heart disease, stroke, and other problems.  Blood pressure consists of two numbers, a higher number over a lower, 110/72, for example. It is stated as "110 over 72." The ideal is below 120 for the top number (systolic) and under 80 for the bottom (diastolic). Write down your blood pressure  today. You should pay close attention to your blood pressure if you have certain conditions such as: Heart failure. Prior heart attack.  Diabetes  Chronic kidney disease.  Prior stroke.  Multiple risk factors for heart disease.   To see if you have HTN, your blood pressure should be measured while you are seated with your arm held at the level of the heart. It should be measured at least twice. A one-time elevated blood pressure reading (especially in the Emergency Department) does not mean that you need treatment. There may be conditions in which the blood pressure is different between your right and left arms. It is important to see your caregiver soon for a recheck. Most people have essential hypertension which means that there is not a specific cause. This type of high blood pressure may be lowered by changing lifestyle factors such as: Stress. Smoking.  Lack of exercise.  Excessive weight. Drug/tobacco/alcohol use.  Eating less salt.   Most people do not have symptoms from high blood pressure until it has caused damage to the body. Effective treatment can often prevent, delay or reduce that damage. TREATMENT Treatment for high blood  pressure, when a cause has been identified, is directed at the cause. There are a large number of medications to treat HTN. These fall into several categories, and your caregiver will help you select the medicines that are best for you. Medications may have side effects. You should review side effects with your caregiver. If your blood pressure stays high after you have made lifestyle changes or started on medicines,  Your medication(s) may need to be changed.  Other problems may need to be addressed.  Be certain you understand your prescriptions, and know how and when to take your medicine.  Be sure to follow up with your caregiver within the time frame advised (usually within two weeks) to have your blood pressure rechecked and to review your medications.  If  you are taking more than one medicine to lower your blood pressure, make sure you know how and at what times they should be taken. Taking two medicines at the same time can result in blood pressure that is too low.  SEEK IMMEDIATE MEDICAL CARE IF YOU DEVELOP: A severe headache, blurred or changing vision, or confusion.  Unusual weakness or numbness, or a faint feeling.  Severe chest or abdominal pain, vomiting, or breathing problems.  MAKE SURE YOU:  Understand these instructions.  Will watch your condition.  Will get help right away if you are not doing well or get worse.  Document Released: 07/16/2005 Document Re-Released: 01/03/2010  [facility] Patient Information 2011 [place]., not controlled by medicine.  MAKE SURE YOU:  Understand these instructions.   Will watch your condition.   Will get help right away if you are not doing well or get worse.  Document Released: 07/13/2000 Document Re-Released: 01/03/2010 [facility] Patient Information 2011 [place].Edema Edema is an abnormal build-up of fluids in tissues. Because this is partly dependent on gravity (water flows to the lowest place), it is more common in the lower extremities (legs and thighs). It is also common in the looser tissues, like around the eyes. Painless swelling of the feet and ankles is common and increases as a person ages. It may affect both legs and may include the calves or even thighs. When squeezed, the fluid may move out of the affected area and may leave a dent for a few moments. CAUSES  Prolonged standing or sitting in one place for extended periods of time. Movement helps pump tissue fluid into the veins, and absence of movement prevents this, resulting in edema.   Varicose veins. The valves in the veins do not work as well as they should. This causes fluid to leak into the tissues.   Fluid and salt overload.   Injury, burn, or surgery to the leg, ankle, or foot, may damage veins and allow fluid  to leak out.   Sunburn damages vessels. Leaky vessels allow fluid to go out into the sunburned tissues.   Allergies (from insect bites or stings, medications or chemicals) cause swelling by allowing vessels to become leaky.   Protein in the blood helps keep fluid in your vessels. Low protein, as in malnutrition, allows fluid to leak out.   Hormonal changes, including pregnancy and menstruation, cause fluid retention. This fluid may leak out of vessels and cause edema.   Medications that cause fluid retention. Examples are sex hormones, blood pressure medications, steroid treatment, or anti-depressants.   Some illnesses cause edema, especially heart failure, kidney disease, or liver disease.   Surgery that cuts veins or lymph nodes, such as surgery done  for the heart or for breast cancer, may result in edema.  DIAGNOSIS Your caregiver is usually easily able to determine what is causing your swelling (edema) by simply asking what is wrong (getting a history) and examining you (doing a physical). Sometimes x-rays, EKG (electrocardiogram or heart tracing), and blood work may be done to evaluate for underlying medical illness. TREATMENT General treatment includes:  Leg elevation (or elevation of the affected body part).   Restriction of fluid intake.   Prevention of fluid overload.   Compression of the affected body part. Compression with elastic bandages or support stockings squeezes the tissues, preventing fluid from entering and forcing it back into the blood vessels.   Diuretics (also called water pills or fluid pills) pull fluid out of your body in the form of increased urination. These are effective in reducing the swelling, but can have side effects and must be used only under your caregiver's supervision. Diuretics are appropriate only for some types of edema.  The specific treatment can be directed at any underlying causes discovered. Heart, liver, or kidney disease should be treated  appropriately. HOME CARE INSTRUCTIONS  Elevate the legs (or affected body part) above the level of the heart, while lying down.   Avoid sitting or standing still for prolonged periods of time.   Avoid putting anything directly under the knees when lying down, and do not wear constricting clothing or garters on the upper legs.   Exercising the legs causes the fluid to work back into the veins and lymphatic channels. This may help the swelling go down.   The pressure applied by elastic bandages or support stockings can help reduce ankle swelling.   A low-salt diet may help reduce fluid retention and decrease the ankle swelling.   Take any medications exactly as prescribed.  SEEK MEDICAL CARE IF:  Your edema is not responding to recommended treatments.  SEEK IMMEDIATE MEDICAL CARE IF:  You develop shortness of breath or chest pain.   You cannot breathe when you lay down; or if, while lying down, you have to get up and go to the window to get your breath.   You are having increasing swelling without relief from treatment.   You develop a fever over 100.5.   You develop pain or redness in the areas that are swollen.   Tell your caregiver right away if you have gained 2 lb in 1 day or 5 lb in a week.  MAKE SURE YOU:  Understand these instructions.   Will watch your condition.   Will get help right away if you are not doing well or get worse.  Document Released: 07/16/2005 Document Re-Released: 01/03/2010 [facility] Patient Information 2011 [place].

## 2010-11-20 NOTE — Assessment & Plan Note (Signed)
She has not had any flares recently. I will check her uric acid level today and monitor her renal function

## 2010-11-20 NOTE — Assessment & Plan Note (Signed)
This is unchanged

## 2010-11-20 NOTE — Progress Notes (Signed)
Subjective:    Patient ID: [name], female    DOB: 1940/09/23, 70 y.o.   MRN: [id number]  Hypertension This is a chronic problem. The current episode started more than 1 year ago. Associated symptoms include peripheral edema. Pertinent negatives include no anxiety, blurred vision, chest pain, headaches, neck pain, orthopnea, palpitations, PND, shortness of breath or sweats. There are no associated agents to hypertension. Past treatments include angiotensin blockers and diuretics. The current treatment provides moderate improvement. There are no compliance problems.  Identifiable causes of hypertension include chronic renal disease.      Review of Systems  Constitutional: Negative for fever, chills, diaphoresis, activity change, appetite change, fatigue and unexpected weight change.  HENT: Negative for sore throat, trouble swallowing and neck pain.   Eyes: Negative for blurred vision, pain, discharge, redness and itching.  Respiratory: Negative for apnea, cough, choking, chest tightness, shortness of breath, wheezing and stridor.   Cardiovascular: Positive for leg swelling (she has chronic unchanged edema in her legs). Negative for chest pain, palpitations, orthopnea and PND.  Gastrointestinal: Positive for constipation. Negative for nausea, vomiting, abdominal pain, diarrhea, blood in stool, abdominal distention, anal bleeding and rectal pain.  Genitourinary: Negative for dysuria, urgency, frequency, hematuria, decreased urine volume, enuresis and difficulty urinating.  Musculoskeletal: Negative for myalgias, back pain, joint swelling, arthralgias and gait problem.  Skin: Negative for color change, pallor and rash.  Neurological: Negative for dizziness, tremors, seizures, syncope, facial asymmetry, speech difficulty, weakness, light-headedness, numbness and headaches.  Hematological: Negative for adenopathy. Does not bruise/bleed easily.  Psychiatric/Behavioral: Negative for  hallucinations, behavioral problems, confusion, dysphoric mood, decreased concentration and agitation. The patient is not nervous/anxious and is not hyperactive.        Objective:   Physical Exam  [vitalsreviewed. Constitutional: She is oriented to person, place, and time. She appears well-developed and well-nourished. No distress.  HENT:  Head: Normocephalic and atraumatic.  Right Ear: External ear normal.  Left Ear: External ear normal.  Nose: Nose normal.  Mouth/Throat: Oropharynx is clear and moist. No oropharyngeal exudate.  Eyes: Conjunctivae and EOM are normal. Pupils are equal, round, and reactive to light. Right eye exhibits no discharge. Left eye exhibits no discharge. No scleral icterus.  Neck: Normal range of motion. Neck supple. No JVD present. No tracheal deviation present. No thyromegaly present.  Cardiovascular: Normal rate, regular rhythm, normal heart sounds and intact distal pulses.  Exam reveals no gallop and no friction rub.   No murmur heard. Pulmonary/Chest: Effort normal and breath sounds normal. No respiratory distress. She has no wheezes. She has no rales. She exhibits no tenderness.  Abdominal: Soft. Bowel sounds are normal. She exhibits no distension and no mass. There is no tenderness. There is no rebound and no guarding.  Musculoskeletal: Normal range of motion. She exhibits no tenderness. Edema: 1+ in both legs.  Lymphadenopathy:    She has no cervical adenopathy.  Neurological: She is alert and oriented to person, place, and time. She has normal reflexes. She displays normal reflexes. No cranial nerve deficit. She exhibits normal muscle tone. Coordination normal.  Skin: Skin is warm and dry. No rash noted. She is not diaphoretic. No erythema. No pallor.  Psychiatric: She has a normal mood and affect. Her behavior is normal. Judgment and thought content normal.         Lab Results  Component Value Date   WBC 13.0* 07/27/2010   HGB 13.2 07/27/2010    HCT 39.3 07/27/2010   PLT 287.0 07/27/2010  CHOL 224* 08/22/2009   TRIG 157.0* 08/22/2009   HDL 46.20 08/22/2009   LDLDIRECT 154.6 08/22/2009   ALT 13 07/27/2010   AST 19 07/27/2010   NA 136 07/27/2010   K 4.0 07/27/2010   CL 96 07/27/2010   CREATININE 1.3* 07/27/2010   BUN 23 07/27/2010   CO2 31 07/27/2010   TSH 1.21 03/22/2010   HGBA1C 6.3 03/22/2010   MICROALBUR 0.5 11/17/2008   Assessment & Plan:

## 2010-11-20 NOTE — Assessment & Plan Note (Signed)
Will monitor her renal function today

## 2010-11-20 NOTE — Assessment & Plan Note (Signed)
This is in remission

## 2010-11-20 NOTE — Assessment & Plan Note (Signed)
She has adequate BP control

## 2010-11-20 NOTE — Assessment & Plan Note (Signed)
She seems to be well compensated for this

## 2010-11-20 NOTE — Assessment & Plan Note (Signed)
resolved 

## 2010-12-12 NOTE — Assessment & Plan Note (Signed)
[place]                             PULMONARY OFFICE NOTE   DONDRA, RHETT                      MRN:          [id number]  DATE:02/17/2007                            DOB:          12-Jul-1941    PULMONARY CONSULTATION:   REASON FOR CONSULTATION:  Recurrent pneumonia/bronchitis.   HISTORY:  This is a 70 year old white female former smoker with  predominant restrictive changes by previous PFTs October 2007 with a  disproportionate reduction in expiratory reserve volume typical of the  effects of obesity, who had been seen here previously for unexplained  dyspnea and cough that was felt to be ACE inhibitor-related and seemed  to improve after elimination of ACE inhibitors.  However, she has taken  a definite turn for the worse since the first week in July with a  congested barking quality cough associated with dyspnea and subjective  wheeze, for which she has been seen now 3 times, twice at the [facility] office and once at Urgent Care, with a diagnosis of bronchitis  versus pneumonia.  She has already received a course of Zithromax,  prednisone and now Avelox but continues to be limited by severe coughing  paroxysms that occur both at rest and while trying to sleep.  She,  however, is not actually able to cough any mucus up.   PAST MEDICAL HISTORY:  1. Morbid obesity.  2. Hypertension.  3. Allergy and sinus problems.   ALLERGIES:  None known.   MEDICATIONS:  No longer on ACE inhibitors.  She takes:   1. Amlodipine 5 mg one daily.  2. Benicar 40 mg daily.  3. Baby aspirin one daily.  4. Lasix 40 mg daily.   SOCIAL HISTORY:  She quit smoking in 1998.  Weighed then about 30 pounds  less than she does now.   FAMILY HISTORY:  Significant for asthma and allergies in mother.  Negative for respiratory disease.   Otherwise, review of systems taken in detail and negative except as  outlined above.   PHYSICAL EXAMINATION:  This is a  depressed, despondent white female with  a hopeless, helpless affect and attitude.  She has a barking-quality  cough and could not stop coughing during the office visit to speak a  full sentence.  She was extremely hoarse but afebrile, in no acute  distress otherwise.  She is afebrile with normal vital signs.  HEENT:  Remarkable for the fact that it was unremarkable.  Oropharynx  was clear.  NECK:  Supple without cervical adenopathy or tenderness.  Trachea was  midline, no thyromegaly.  Lung fields completely clear bilaterally to auscultation and percussion  except for psuedowheeze.  There was a regular rate and rhythm without murmur, gallop, or rub  present.  ABDOMEN:  Soft, benign but obese.  Normal excursion in supine position.  EXTREMITIES:  Warm without calf tenderness, cyanosis, clubbing or edema.   Chest x-ray today showed no definite infiltrates or effusions.   IMPRESSION:  Refractory cough that has developed in a patient who  previously could not tolerate ACE inhibitors.  Although I did not  make a  big deal about it when the diagnosis was made previously, I have come to  learn the hard way that patients that have ACE inhibitor-induced cough  typically have a secondary mechanism driving the cough.  That is, ACE  inhibitors only bring to the surface a second problem that may cause  chronic cough.  In this particular care, the cough was adequately  controlled after elimination of ACE inhibitors but now is totally out of  control in the setting of a URI.  I suspect the underlying problem is  reflux-related, noting that she does carry a diagnosis of  gastroesophageal reflux disease all the way back to 1994 according to  her old records.   I therefore recommend the following specifics in writing:  1. The chart indicates she cannot take Zegerid or Nexium.  I am going      to try her on Aciphex, chemically less related to those two, at a      dose of 20 mg tablet taken 30 minutes  before breakfast along with a      diet which emphasizes the elimination of mint and menthol lozenges,      and also Zantac 75 mg two at bedtime.  2. Prednisone 10 mg tablets #14, to be tapered off over 6 days.  3. Control excess coughing with Mucinex DM supplemented by tramadol.  4. Return here in 2 weeks for follow-up with the option of adding      Reglan to the above empiric regimen and/or proceeding with a sinus      CT scan if her symptoms remain refractory.   One of the problems is the patient states she gets better from acute  management but is concerned about her rate of relapse and what to do in  the event of the next time she has bronchitis.  I have offered her  very specific recommendations regarding this issue as well in writing.  I would be happy to have her seen here by our nurse practitioner or by  me if I am in the office at the very onset of this illness so that she  does not need to go to the emergency room or urgent care centers, where  I believe her care will tend to be fragmented.   I wish I could do more to help this nice lady and certainly would  entertain the possibility of a second opinion if Dr. Council Mechanic prefers,  but I would strongly recommend the patient follow the  instruction she was given today until she returns in 2 weeks and then we  will regroup at that point.     Christena Deem. Melvyn Novas, MD, [facility]  Electronically Signed    MBW/MedQ  DD: 02/17/2007  DT: 02/18/2007  Job #: [id number]   cc:   Modesto Charon, MD

## 2010-12-15 NOTE — Assessment & Plan Note (Signed)
[name]                               PULMONARY OFFICE NOTE   NAME:LEMONSSuraya, [name]                      MRN:          [id number]  DATE:05/01/2006                            DOB:          07-12-1941    PULMONARY EXTENDED OFFICE VISIT:   HISTORY OF PRESENT ILLNESS:  A 70 year old white female seen at Dr.  Melanie [name] request (per Laurie Mage, FNP) for evaluation of cough that I  thought was probably upper airway in nature when I saw her on August 21 and  recommended elimination of ACE inhibitors in favor of Benicar and Sular.  She states the cough is quite a bit better with no nocturnal disturbance or  excessive sputum production, fever, chills, sweats, orthopnea, PND, leg  swelling or dyspnea.   MEDICATIONS:  No inhalers and the combination of Lasix, Benicar and Sular.  She has not required any inhalers.  She took Zegerid on a limited basis  after her last visit (because I thought reflux might be also an issue  triggering ACE inhibitor intolerance) but could not tolerate this due to GI  upset.  She said she had the exact same problem with Nexium.   PHYSICAL EXAMINATION:  GENERAL:  She is a pleasant, ambulatory white female  in no acute distress.  VITAL SIGNS:  Good, weight 222 pounds.  No change from baseline.  Blood  pressure is 136/74 which is improved compared to her previous visit.  VITAL SIGNS:  Stable.  HEENT:  Unremarkable.  Pharynx is clear.  LUNGS:  Fields perfectly clear bilaterally.  HEART:  Regular rhythm without murmurs, gallops, rubs.  ABDOMEN:  Soft, benign.  EXTREMITIES:  Warm without calf tenderness, cyanosis, clubbing or edema.   STUDIES:  PFT's were reviewed with the patient and showed no evidence of air  flow obstruction.  She does have a restrictive abnormality consistent with  obesity with a disproportionate reduction in ERV.   IMPRESSION:  1. Cough, most likely, is upper airway in nature.  It was exacerbated by   ACE inhibitors.  It is still present to some extent, but tolerable.  If      it worsens, the next step would be to add Pepcid AC b.i.d.  The next      step after that would probably be to see [name], her      gastroenterology physician on record, because she has a repeated      pattern of intolerance to PPI's.  2. No evidence of chronic obstructive pulmonary disease, despite long      standing smoking history.  I do not believe there is any benefit there      for her to use any inhalers of any kind except perhaps during      exacerbations of a bronchitis short term on a symptomatic basis only.  3. Hypertension is under excellent control with present combination which      includes diuretics, calcium channel blockers and ARB's.  It may well be      she does not need such a complicated regimen for blood pressure, but  I      am going to defer this issue to Dr. Council [name].  4. I emphasized to the patient that obesity is really the key to long term      lung health, especially in terms of controlling gastroesophageal reflux      disease and maintaining her present lung function without suffering      from significant additional restrictive problems as she ages.  I did      give her a gastroesophageal reflux disease diet and referred her back      to Dr. Council [name] for further primary care purposes.  Pulmonary follow up      will be p.r.n.            ______________________________  Laurie Deem. Melvyn Novas, MD, [facility]      MBW/MedQ  DD:  05/01/2006  DT:  05/02/2006  Job #:  [id number]   cc:   Laurie D. Bean, FNP  Laurie Charon, MD

## 2010-12-15 NOTE — Assessment & Plan Note (Signed)
[place]                               PULMONARY OFFICE NOTE   [name], [name]                      MRN:          [id number]  DATE:03/19/2006                            DOB:          September 18, 1940    REQUESTING PHYSICIAN:  This is a pulmonary consultation requested by Dr.  Darden Amber.   CHIEF COMPLAINT:  Cough.   HISTORY:  [age]-year-old white female with a history of smoking,  complicated by recurrent bronchitis, which improved after she stopped  smoking in 1998 and only bothered her now and then until about 4 weeks  ago, and now she has been having trouble with a hacking cough that is  present 24 hours a day, associated with a sensation of choking, with minimal  mucus production, and dyspnea with minimal exertion.  She denies any active  sinus symptoms, but does have intermittent reflux symptomatically for which  she has a prescription, but has not filled it, for PPIs.  The patient denies  any purulent sputum production, fevers, chills, sweats, orthopnea, PND or  leg swelling.   PAST MEDICAL HISTORY:  1. Significant for hypertension, for which she is on chronic ACE      inhibitors.  2. Allergies and sinus and asthma problems.   ALLERGIES:  None known.   MEDICATIONS:  Include Atacand, Lasix, Vasotec, aspirin and Spiriva.  Note:  She also has a Xopenex nebulizer, which she says helps some, and has taken  a course of prednisone which she said helped some, but even that did not  eliminate the cough.   SOCIAL HISTORY:  She quit smoking in 1998, at a weight about 20 pounds less  than she is now.  She has worked as a Scientist, water quality at [employer], but no unusual  travel, pet or hobby exposure.   FAMILY HISTORY:  Significant for asthma and allergies in her mother,  negative for respiratory diseases otherwise.   REVIEW OF SYSTEMS:  Taken in detail also on the work sheet, and significant  for the problems as outlined above.   PHYSICAL  EXAMINATION:  GENERAL:  This is an obese, ambulatory white female  in no acute distress, who is very hoarse with a barking/honking quality  upper airway cough.  HEENT:  Remarkably unremarkable except for the fact she has upper dentures.  Nasal turbinates are normal.  The ear canals are clear bilaterally.  The  oropharynx is clear with no evidence of excessive postnasal drainage or  cobblestoning.  NECK:  Supple without cervical adenopathy or tenderness.  The trachea was  midline, no thyromegaly.  LUNGS:  Lung fields are perfectly clear bilaterally to auscultation and  percussion.  There is no cough elicited on inspiratory or expiratory  maneuvers.  CARDIAC:  There is a regular rhythm without murmur, gallop or rub.  ABDOMEN:  Soft, obese but benign.  EXTREMITIES:  Warm without calf tenderness, cyanosis, clubbing or edema.   Chest x-ray was reviewed from February 28, 2006, and is normal.   IMPRESSION:  1. Classic upper airway coughing, typical of angiotensin-converting enzyme      inhibitor  effects plus the effects of occult reflux or both (in their      synergist fashion).  The recommendation is that she stop angiotensin-      converting enzyme inhibitors and replace them with Benicar 40 mg 1      daily, and Sular 20 mg 1 daily.  2. History of smoking with question of chronic obstructive pulmonary      disease/chronic bronchitis.  I would like for her to return for      pulmonary function tests in 6 weeks to establish whether or not there      is enough chronic obstructive pulmonary disease or airflow obstruction      to justify chronic Spiriva use, but in the meantime would ask her to      stop the Spiriva because it may irritate the upper airway and      perpetuate the cough.  3. Obesity complicated by gastroesophageal reflux disease.  I gave her a      diet, but note that she had never been scoped, but I do not believe      that this will be necessary if the symptoms can be eradicated  with a      short course of Zegerid (my experience has been that angiotensin-      converting enzyme inhibitors and reflux go together like sparks and      gasoline, and that once the spark is eliminated (the angiotensin-      converting enzyme inhibitor) the gasoline (which corresponds to acid      reflux in this analogy) can be managed through lifestyle and diet      modification).  If not, she may need to consider gastrointestinal      evaluation.  4. For cyclical cough, I recommended Delsym 2 tsp every 12 hours, and for      wheezing and dyspnea I would recommend only use of the nebulizer and      not the metered-dose inhalers or powders, which tend to make the cough      worse.  5. Followup will be in 6 weeks with pulmonary function tests.  We will see      her sooner if needed.  And additionally, since she said prednisone      helped before, I am going to give her a 6-day course of prednisone for      any inflammatory component that may be associated with cyclical      coughing.                                   Christena Deem. Melvyn Novas, MD, [facility]   MBW/MedQ  DD:  03/19/2006  DT:  03/20/2006  Job #:  [id number]   cc:   [place] Charon, MD

## 2010-12-21 ENCOUNTER — Other Ambulatory Visit: Payer: Self-pay | Admitting: Internal Medicine

## 2010-12-21 NOTE — Telephone Encounter (Signed)
Rx Done.

## 2011-02-19 ENCOUNTER — Ambulatory Visit (INDEPENDENT_AMBULATORY_CARE_PROVIDER_SITE_OTHER): Payer: Medicare Other | Admitting: Internal Medicine

## 2011-02-19 ENCOUNTER — Encounter: Payer: Self-pay | Admitting: Internal Medicine

## 2011-02-19 VITALS — BP 168/100 | HR 72 | Temp 97.5°F | Resp 16 | Ht 60.0 in | Wt 225.0 lb

## 2011-02-19 DIAGNOSIS — M109 Gout, unspecified: Secondary | ICD-10-CM

## 2011-02-19 DIAGNOSIS — I10 Essential (primary) hypertension: Secondary | ICD-10-CM

## 2011-02-19 MED ORDER — NEBIVOLOL HCL 5 MG PO TABS: 5.0000 mg | ORAL_TABLET | Freq: Every day | ORAL | Status: DC

## 2011-02-19 MED ORDER — OLMESARTAN MEDOXOMIL-HCTZ 40-12.5 MG PO TABS: 1.0000 | ORAL_TABLET | Freq: Every day | ORAL | Status: DC

## 2011-02-19 MED ORDER — KETOROLAC TROMETHAMINE 60 MG/2ML IM SOLN
60.0000 mg | Freq: Once | INTRAMUSCULAR | Status: AC
  Administered 2011-02-19: 60 mg via INTRAMUSCULAR

## 2011-02-19 MED ORDER — COLCHICINE 0.6 MG PO TABS: 0.6000 mg | ORAL_TABLET | Freq: Two times a day (BID) | ORAL | Status: DC

## 2011-02-19 NOTE — Progress Notes (Signed)
Subjective:    Patient ID: [name], female    DOB: 1941-06-03, 70 y.o.   MRN: [id number]  Hypertension This is a chronic problem. The current episode started more than 1 year ago. The problem has been gradually worsening since onset. The problem is uncontrolled. Pertinent negatives include no anxiety, blurred vision, chest pain, headaches, malaise/fatigue, neck pain, orthopnea, palpitations, peripheral edema, PND, shortness of breath or sweats. There are no associated agents to hypertension. Past treatments include angiotensin blockers. The current treatment provides mild improvement. Compliance problems include exercise, diet and medication cost.       Review of Systems  Constitutional: Negative for fever, chills, malaise/fatigue, diaphoresis, activity change, appetite change, fatigue and unexpected weight change.  HENT: Negative for facial swelling, trouble swallowing, neck pain and voice change.   Eyes: Negative for blurred vision, photophobia, pain, discharge, redness, itching and visual disturbance.  Respiratory: Negative for shortness of breath.   Cardiovascular: Negative for chest pain, palpitations, orthopnea, leg swelling and PND.  Gastrointestinal: Negative for nausea, vomiting, abdominal pain, diarrhea, constipation and anal bleeding.  Genitourinary: Negative for dysuria, urgency, frequency, hematuria, flank pain, decreased urine volume, enuresis, difficulty urinating and dyspareunia.  Musculoskeletal: Positive for joint swelling (pain and swelling in right index finger in the DIP joint) and arthralgias (knees). Negative for myalgias, back pain and gait problem.  Skin: Negative for color change, pallor, rash and wound.  Neurological: Negative for dizziness, tremors, seizures, syncope, facial asymmetry, speech difficulty, weakness, light-headedness, numbness and headaches.  Hematological: Negative for adenopathy. Does not bruise/bleed easily.  Psychiatric/Behavioral:  Negative.        Objective:   Physical Exam  Vitals reviewed. Constitutional: She is oriented to person, place, and time. She appears well-developed and well-nourished. No distress.  HENT:  Head: Normocephalic and atraumatic.  Right Ear: External ear normal.  Left Ear: External ear normal.  Nose: Nose normal.  Mouth/Throat: Oropharynx is clear and moist. No oropharyngeal exudate.  Eyes: Conjunctivae and EOM are normal. Pupils are equal, round, and reactive to light. Right eye exhibits no discharge. Left eye exhibits no discharge. No scleral icterus.  Neck: Normal range of motion. Neck supple. No JVD present. No tracheal deviation present. No thyromegaly present.  Cardiovascular: Normal rate, normal heart sounds and intact distal pulses.  Exam reveals no gallop and no friction rub.   No murmur heard. Pulmonary/Chest: Effort normal and breath sounds normal. No stridor. No respiratory distress. She has no wheezes. She has no rales. She exhibits no tenderness.  Abdominal: Soft. Bowel sounds are normal. She exhibits no distension and no mass. There is no tenderness. There is no rebound and no guarding.  Musculoskeletal: She exhibits edema (trace edema in both legs). She exhibits no tenderness.  Lymphadenopathy:    She has no cervical adenopathy.  Neurological: She is alert and oriented to person, place, and time. She has normal reflexes. She displays normal reflexes. No cranial nerve deficit. She exhibits normal muscle tone. Coordination normal.  Skin: Skin is warm and dry. No rash noted. She is not diaphoretic. No erythema. No pallor.  Psychiatric: She has a normal mood and affect. Her behavior is normal. Judgment and thought content normal.         Lab Results  Component Value Date   WBC 10.3 11/20/2010   HGB 13.0 11/20/2010   HCT 38.4 11/20/2010   PLT 233.0 11/20/2010   CHOL 204* 11/20/2010   TRIG 151.0* 11/20/2010   HDL 49.60 11/20/2010   LDLDIRECT 135.3 11/20/2010  ALT 12 11/20/2010     AST 18 11/20/2010   NA 136 11/20/2010   K 4.2 11/20/2010   CL 98 11/20/2010   CREATININE 1.2 11/20/2010   BUN 28* 11/20/2010   CO2 30 11/20/2010   TSH 1.70 11/20/2010   HGBA1C 6.2 11/20/2010   MICROALBUR 0.5 11/17/2008   Assessment & Plan:

## 2011-02-19 NOTE — Patient Instructions (Signed)
Hypertension (High Blood Pressure) As your heart beats, it forces blood through your arteries. This force is your blood pressure. If the pressure is too high, it is called hypertension (HTN) or high blood pressure. HTN is dangerous because you may have it and not know it. High blood pressure may mean that your heart has to work harder to pump blood. Your arteries may be narrow or stiff. The extra work puts you at risk for heart disease, stroke, and other problems.  Blood pressure consists of two numbers, a higher number over a lower, 110/72, for example. It is stated as "110 over 72." The ideal is below 120 for the top number (systolic) and under 80 for the bottom (diastolic). Write down your blood pressure today. You should pay close attention to your blood pressure if you have certain conditions such as:  Heart failure.  Prior heart attack.   Diabetes   Chronic kidney disease.   Prior stroke.   Multiple risk factors for heart disease.   To see if you have HTN, your blood pressure should be measured while you are seated with your arm held at the level of the heart. It should be measured at least twice. A one-time elevated blood pressure reading (especially in the Emergency Department) does not mean that you need treatment. There may be conditions in which the blood pressure is different between your right and left arms. It is important to see your caregiver soon for a recheck. Most people have essential hypertension which means that there is not a specific cause. This type of high blood pressure may be lowered by changing lifestyle factors such as:  Stress.  Smoking.   Lack of exercise.   Excessive weight.  Drug/tobacco/alcohol use.   Eating less salt.   Most people do not have symptoms from high blood pressure until it has caused damage to the body. Effective treatment can often prevent, delay or reduce that damage. TREATMENT Treatment for high blood pressure, when a cause has been  identified, is directed at the cause. There are a large number of medications to treat HTN. These fall into several categories, and your caregiver will help you select the medicines that are best for you. Medications may have side effects. You should review side effects with your caregiver. If your blood pressure stays high after you have made lifestyle changes or started on medicines,   Your medication(s) may need to be changed.   Other problems may need to be addressed.   Be certain you understand your prescriptions, and know how and when to take your medicine.   Be sure to follow up with your caregiver within the time frame advised (usually within two weeks) to have your blood pressure rechecked and to review your medications.   If you are taking more than one medicine to lower your blood pressure, make sure you know how and at what times they should be taken. Taking two medicines at the same time can result in blood pressure that is too low.  SEEK IMMEDIATE MEDICAL CARE IF YOU DEVELOP:  A severe headache, blurred or changing vision, or confusion.   Unusual weakness or numbness, or a faint feeling.   Severe chest or abdominal pain, vomiting, or breathing problems.  MAKE SURE YOU:   Understand these instructions.   Will watch your condition.   Will get help right away if you are not doing well or get worse.  Document Released: 07/16/2005 Document Re-Released: 01/03/2010 [facility] Patient Information 2011 [place].Gout Gout is an inflammatory condition (arthritis) caused by a buildup of uric acid crystals in the joints. Uric acid is a chemical that is normally present in the blood. Under some circumstances, uric acid can form into crystals in your joints. This causes joint redness, soreness, and swelling (inflammation). Repeat attacks are common. Over time, uric acid crystals can form into masses (tophi) near a joint, causing disfigurement. Gout is treatable and often  preventable. CAUSES The disease begins with elevated levels of uric acid in the blood. Uric acid is produced by your body when it breaks down a naturally found substance called purines. This also happens when you eat certain foods such as meats and fish. Causes of an elevated uric acid level include:  Being passed down from parent to child (heredity).   Diseases that cause increased uric acid production (obesity, psoriasis, some cancers).   Excessive alcohol use.   Diet, especially diets rich in meat and seafood.   Medicines, including certain cancer-fighting drugs (chemotherapy), diuretics, and aspirin.   Chronic kidney disease. The kidneys are no longer able to remove uric acid well.   Problems with metabolism.  Conditions strongly associated with gout include:  Obesity.   High blood pressure.   High cholesterol.   Diabetes.  Not everyone with elevated uric acid levels gets gout. It is not understood why some people get gout and others do not. Surgery, joint injury, and eating too much of certain foods are some of the factors that can lead to gout. SYMPTOMS  An attack of gout comes on quickly. It causes intense pain with redness, swelling, and warmth in a joint.   Fever can occur.   Often, only one joint is involved. Certain joints are more commonly involved:   Base of the big toe.   Knee.   Ankle.   Wrist.   Finger.  Without treatment, an attack usually goes away in a few days to weeks. Between attacks, you usually will not have symptoms, which is different from many other forms of arthritis. DIAGNOSIS Your caregiver will suspect gout based on your symptoms and exam. Removal of fluid from the joint (arthrocentesis) is done to check for uric acid crystals. Your caregiver will give you a medicine that numbs the area (local anesthetic) and use a needle to remove joint fluid for exam. Gout is confirmed when uric acid crystals are seen in joint fluid, using a special  microscope. Sometimes, blood, urine, and X-ray tests are also used. TREATMENT There are 2 phases to gout treatment: treating the sudden onset (acute) attack and preventing attacks (prophylaxis). Treatment of an Acute Attack  Medicines are used. These include anti-inflammatory medicines or steroid medicines.   An injection of steroid medicine into the affected joint is sometimes necessary.   The painful joint is rested. Movement can worsen the arthritis.   You may use warm or cold treatments on painful joints, depending which works best for you.   Discuss the use of coffee, vitamin C, or cherries with your caregiver. These may be helpful treatment options.  Treatment to Prevent Attacks After the acute attack subsides, your caregiver may advise prophylactic medicine. These medicines either help your kidneys eliminate uric acid from your body or decrease your uric acid production. You may need to stay on these medicines for a very long time. The early phase of treatment with prophylactic medicine can be associated with an increase in acute gout attacks. For this reason, during the first few months of treatment, your  caregiver may also advise you to take medicines usually used for acute gout treatment. Be sure you understand your caregiver's directions. You should also discuss dietary treatment with your caregiver. Certain foods such as meats and fish can increase uric acid levels. Other foods such as dairy can decrease levels. Your caregiver can give you a list of foods to avoid. HOME CARE INSTRUCTIONS  Do not take aspirin to relieve pain. This raises uric acid levels.   Only take over-the-counter or prescription medicines for pain, discomfort, or fever as directed by your caregiver.   Rest the joint as much as possible. When in bed, keep sheets and blankets off painful areas.   Keep the affected joint raised (elevated).   Use crutches if the painful joint is in your leg.   Drink enough  water and fluids to keep your urine clear or pale yellow. This helps your body get rid of uric acid. Do not drink alcoholic beverages. They slow the passage of uric acid.   Follow your caregiver's dietary instructions. Pay careful attention to the amount of protein you eat. Your daily diet should emphasize fruits, vegetables, whole grains, and fat-free or low-fat milk products.   Maintain a healthy body weight.  SEEK MEDICAL CARE IF:  You have an oral temperature above 100.5.   You develop diarrhea, vomiting, or any side effects from medicines.   You do not feel better in 24 hours, or you are getting worse.  SEEK IMMEDIATE MEDICAL CARE IF:  Your joint becomes suddenly more tender and you have:   Chills.   An oral temperature above 100.5, not controlled by medicine.  MAKE SURE YOU:  Understand these instructions.   Will watch your condition.   Will get help right away if you are not doing well or get worse.  Document Released: 07/13/2000 Document Re-Released: 01/03/2010 [facility] Patient Information 2011 [place].

## 2011-02-19 NOTE — Assessment & Plan Note (Signed)
She has an acute gouty arthropathy in her right index finger, she needs a higher dose of allopurinol but I dare not change the dose in the midst of a gout flare so I will give her Toradol in the office today and restart colcrys (she has been non-compliant with this in the past)

## 2011-02-19 NOTE — Assessment & Plan Note (Signed)
I have changed her BP meds to get better control

## 2011-03-20 ENCOUNTER — Encounter: Payer: Self-pay | Admitting: Gastroenterology

## 2011-04-11 ENCOUNTER — Other Ambulatory Visit (INDEPENDENT_AMBULATORY_CARE_PROVIDER_SITE_OTHER): Payer: Medicare Other

## 2011-04-11 ENCOUNTER — Encounter: Payer: Self-pay | Admitting: Internal Medicine

## 2011-04-11 ENCOUNTER — Ambulatory Visit (INDEPENDENT_AMBULATORY_CARE_PROVIDER_SITE_OTHER): Payer: Medicare Other | Admitting: Internal Medicine

## 2011-04-11 DIAGNOSIS — J449 Chronic obstructive pulmonary disease, unspecified: Secondary | ICD-10-CM

## 2011-04-11 DIAGNOSIS — E785 Hyperlipidemia, unspecified: Secondary | ICD-10-CM

## 2011-04-11 DIAGNOSIS — R609 Edema, unspecified: Secondary | ICD-10-CM

## 2011-04-11 DIAGNOSIS — I10 Essential (primary) hypertension: Secondary | ICD-10-CM

## 2011-04-11 DIAGNOSIS — R7989 Other specified abnormal findings of blood chemistry: Secondary | ICD-10-CM

## 2011-04-11 DIAGNOSIS — N259 Disorder resulting from impaired renal tubular function, unspecified: Secondary | ICD-10-CM

## 2011-04-11 DIAGNOSIS — J4489 Other specified chronic obstructive pulmonary disease: Secondary | ICD-10-CM

## 2011-04-11 DIAGNOSIS — M109 Gout, unspecified: Secondary | ICD-10-CM

## 2011-04-11 LAB — URINALYSIS, ROUTINE W REFLEX MICROSCOPIC
Bilirubin Urine: NEGATIVE
Ketones, ur: NEGATIVE
Total Protein, Urine: NEGATIVE
pH: 6 (ref 5.0–8.0)

## 2011-04-11 LAB — CBC WITH DIFFERENTIAL/PLATELET
Basophils Absolute: 0.1 10*3/uL (ref 0.0–0.1)
Basophils Relative: 0.5 % (ref 0.0–3.0)
Eosinophils Absolute: 0.3 10*3/uL (ref 0.0–0.7)
Hemoglobin: 13.4 g/dL (ref 12.0–15.0)
Lymphocytes Relative: 20.4 % (ref 12.0–46.0)
MCHC: 33.3 g/dL (ref 30.0–36.0)
Monocytes Relative: 7.7 % (ref 3.0–12.0)
Neutro Abs: 7.7 10*3/uL (ref 1.4–7.7)
Neutrophils Relative %: 68.4 % (ref 43.0–77.0)
RBC: 4.84 Mil/uL (ref 3.87–5.11)

## 2011-04-11 LAB — COMPREHENSIVE METABOLIC PANEL
ALT: 12 U/L (ref 0–35)
AST: 17 U/L (ref 0–37)
Albumin: 3.7 g/dL (ref 3.5–5.2)
BUN: 24 mg/dL — ABNORMAL HIGH (ref 6–23)
CO2: 32 mEq/L (ref 19–32)
Calcium: 9.6 mg/dL (ref 8.4–10.5)
Chloride: 102 mEq/L (ref 96–112)
Creatinine, Ser: 1.2 mg/dL (ref 0.4–1.2)
GFR: 48.98 mL/min — ABNORMAL LOW (ref >60.00)
Potassium: 4.7 mEq/L (ref 3.5–5.1)

## 2011-04-11 LAB — LIPID PANEL
HDL: 46.8 mg/dL (ref >39.00)
Triglycerides: 126 mg/dL (ref 0.0–149.0)

## 2011-04-11 MED ORDER — OLMESARTAN MEDOXOMIL-HCTZ 40-25 MG PO TABS: 1.0000 | ORAL_TABLET | Freq: Every day | ORAL | Status: DC

## 2011-04-11 NOTE — Assessment & Plan Note (Signed)
I will check her BUN/creat today

## 2011-04-11 NOTE — Assessment & Plan Note (Signed)
I will increase the HCTZ to 25 mg to decrease her edema

## 2011-04-11 NOTE — Assessment & Plan Note (Signed)
I will check her FLP today

## 2011-04-11 NOTE — Progress Notes (Signed)
Subjective:    Patient ID: [name], female    DOB: August 11, 1940, 70 y.o.   MRN: [id number]  Hypertension This is a chronic problem. The current episode started more than 1 year ago. The problem has been gradually improving since onset. The problem is controlled. Associated symptoms include peripheral edema. Pertinent negatives include no anxiety, blurred vision, chest pain, headaches, malaise/fatigue, neck pain, orthopnea, palpitations, PND, shortness of breath or sweats. There are no associated agents to hypertension. Past treatments include beta blockers, angiotensin blockers and diuretics. The current treatment provides moderate improvement. Compliance problems include exercise and diet.       Review of Systems  Constitutional: Negative for fever, chills, malaise/fatigue, diaphoresis, activity change, appetite change, fatigue and unexpected weight change.  HENT: Negative for sore throat, facial swelling, trouble swallowing, neck pain and voice change.   Eyes: Negative.  Negative for blurred vision.  Respiratory: Negative for apnea, cough, choking, chest tightness, shortness of breath, wheezing and stridor.   Cardiovascular: Positive for leg swelling. Negative for chest pain, palpitations, orthopnea and PND.  Gastrointestinal: Negative for nausea, vomiting, abdominal pain, diarrhea, constipation, abdominal distention and anal bleeding.  Genitourinary: Negative.   Musculoskeletal: Negative for myalgias, back pain, joint swelling, arthralgias and gait problem.  Skin: Negative for color change, pallor, rash and wound.  Neurological: Negative for dizziness, tremors, seizures, syncope, facial asymmetry, speech difficulty, weakness, light-headedness, numbness and headaches.  Hematological: Negative for adenopathy. Does not bruise/bleed easily.  Psychiatric/Behavioral: Negative.        Objective:   Physical Exam  Vitals reviewed. Constitutional: She is oriented to person, place, and  time. She appears well-developed and well-nourished. No distress.  HENT:  Mouth/Throat: Oropharynx is clear and moist. No oropharyngeal exudate.  Eyes: Conjunctivae are normal. Right eye exhibits no discharge. Left eye exhibits no discharge. No scleral icterus.  Neck: Normal range of motion. Neck supple. No JVD present. No tracheal deviation present. No thyromegaly present.  Cardiovascular: Normal rate, regular rhythm, normal heart sounds and intact distal pulses.  Exam reveals no gallop and no friction rub.   No murmur heard. Pulmonary/Chest: Effort normal and breath sounds normal. No stridor. No respiratory distress. She has no wheezes. She has no rales. She exhibits no tenderness.  Abdominal: Soft. Bowel sounds are normal. She exhibits no distension and no mass. There is no tenderness. There is no rebound and no guarding.  Musculoskeletal: She exhibits edema (2+ edema in both legs). She exhibits no tenderness.  Lymphadenopathy:    She has no cervical adenopathy.  Neurological: She is oriented to person, place, and time. She displays normal reflexes. She exhibits normal muscle tone. Coordination normal.  Skin: Skin is warm and dry. No rash noted. She is not diaphoretic. No erythema. No pallor.  Psychiatric: She has a normal mood and affect. Her behavior is normal. Judgment and thought content normal.      Lab Results  Component Value Date   WBC 10.3 11/20/2010   HGB 13.0 11/20/2010   HCT 38.4 11/20/2010   PLT 233.0 11/20/2010   CHOL 204* 11/20/2010   TRIG 151.0* 11/20/2010   HDL 49.60 11/20/2010   LDLDIRECT 135.3 11/20/2010   ALT 12 11/20/2010   AST 18 11/20/2010   NA 136 11/20/2010   K 4.2 11/20/2010   CL 98 11/20/2010   CREATININE 1.2 11/20/2010   BUN 28* 11/20/2010   CO2 30 11/20/2010   TSH 1.70 11/20/2010   HGBA1C 6.2 11/20/2010   MICROALBUR 0.5 11/17/2008  Assessment & Plan:

## 2011-04-11 NOTE — Assessment & Plan Note (Signed)
I will check her A1C today

## 2011-04-11 NOTE — Assessment & Plan Note (Signed)
She is doing well on spiriva

## 2011-04-11 NOTE — Assessment & Plan Note (Signed)
She would better benefit from better BP control so I have increased the dose of her HCTZ

## 2011-04-11 NOTE — Assessment & Plan Note (Signed)
She has no s/s of flare ups today

## 2011-04-11 NOTE — Patient Instructions (Signed)

## 2011-04-24 ENCOUNTER — Encounter: Payer: Self-pay | Admitting: Internal Medicine

## 2011-04-24 ENCOUNTER — Ambulatory Visit (INDEPENDENT_AMBULATORY_CARE_PROVIDER_SITE_OTHER): Payer: Medicare Other | Admitting: Internal Medicine

## 2011-04-24 DIAGNOSIS — J449 Chronic obstructive pulmonary disease, unspecified: Secondary | ICD-10-CM

## 2011-04-24 DIAGNOSIS — J069 Acute upper respiratory infection, unspecified: Secondary | ICD-10-CM

## 2011-04-24 DIAGNOSIS — J4489 Other specified chronic obstructive pulmonary disease: Secondary | ICD-10-CM

## 2011-04-24 DIAGNOSIS — J441 Chronic obstructive pulmonary disease with (acute) exacerbation: Secondary | ICD-10-CM

## 2011-04-24 MED ORDER — FLUTICASONE-SALMETEROL 250-50 MCG/DOSE IN AEPB: 1.0000 | INHALATION_SPRAY | Freq: Two times a day (BID) | RESPIRATORY_TRACT | Status: DC

## 2011-04-24 MED ORDER — PROMETHAZINE-DM 6.25-15 MG/5ML PO SYRP: 5.0000 mL | ORAL_SOLUTION | Freq: Four times a day (QID) | ORAL | Status: AC | PRN

## 2011-04-24 NOTE — Assessment & Plan Note (Signed)
This is viral so no antibiotics were given, I will treat the cough with phenergan-dm

## 2011-04-24 NOTE — Assessment & Plan Note (Signed)
Start advair diskus

## 2011-04-24 NOTE — Progress Notes (Signed)
Subjective:    Patient ID: [name], female    DOB: 1940-09-17, 70 y.o.   MRN: [id number]  URI  This is a new problem. Episode onset: 10 days ago. The problem has been gradually worsening. There has been no fever. Associated symptoms include congestion, coughing (non-productive), rhinorrhea, sinus pain, sneezing, a sore throat and wheezing. Pertinent negatives include no abdominal pain, chest pain, diarrhea, dysuria, ear pain, headaches, joint pain, joint swelling, nausea, neck pain, plugged ear sensation, rash, swollen glands or vomiting. She has tried nothing for the symptoms.  Asthma She complains of cough (non-productive), shortness of breath and wheezing. There is no chest tightness, difficulty breathing, frequent throat clearing, hemoptysis, hoarse voice or sputum production. This is a recurrent problem. The current episode started 1 to 4 weeks ago. The problem occurs intermittently. The cough is non-productive. Associated symptoms include nasal congestion, rhinorrhea, sneezing and a sore throat. Pertinent negatives include no appetite change, chest pain, dyspnea on exertion, ear congestion, ear pain, fever, headaches, heartburn, malaise/fatigue, myalgias, orthopnea, PND, postnasal drip, sweats, trouble swallowing or weight loss. Her symptoms are aggravated by URI. Her past medical history is significant for asthma and COPD.      Review of Systems  Constitutional: Negative for fever, chills, weight loss, malaise/fatigue, diaphoresis, activity change, appetite change, fatigue and unexpected weight change.  HENT: Positive for congestion, sore throat, rhinorrhea and sneezing. Negative for hearing loss, ear pain, nosebleeds, hoarse voice, facial swelling, mouth sores, trouble swallowing, neck pain, neck stiffness, voice change, postnasal drip, sinus pressure and tinnitus.   Eyes: Negative.   Respiratory: Positive for cough (non-productive), shortness of breath and wheezing. Negative for  apnea, hemoptysis, sputum production, choking, chest tightness and stridor.   Cardiovascular: Negative for chest pain, dyspnea on exertion, palpitations, leg swelling and PND.  Gastrointestinal: Negative for heartburn, nausea, vomiting, abdominal pain, diarrhea, constipation, blood in stool, abdominal distention, anal bleeding and rectal pain.  Genitourinary: Negative for dysuria, urgency, frequency, flank pain, decreased urine volume, difficulty urinating and dyspareunia.  Musculoskeletal: Negative for myalgias, back pain, joint pain, joint swelling, arthralgias and gait problem.  Skin: Negative for color change, pallor, rash and wound.  Neurological: Negative for dizziness, tremors, seizures, syncope, facial asymmetry, speech difficulty, weakness, light-headedness, numbness and headaches.  Hematological: Negative for adenopathy. Does not bruise/bleed easily.  Psychiatric/Behavioral: Negative.        Objective:   Physical Exam  Vitals reviewed. Constitutional: She is oriented to person, place, and time. She appears well-developed and well-nourished. No distress.  HENT:  Mouth/Throat: Oropharynx is clear and moist. No oropharyngeal exudate.  Eyes: Conjunctivae are normal. Right eye exhibits no discharge. Left eye exhibits no discharge. No scleral icterus.  Neck: Normal range of motion. Neck supple. No JVD present. No tracheal deviation present. No thyromegaly present.  Cardiovascular: Normal rate, regular rhythm, normal heart sounds and intact distal pulses.  Exam reveals no gallop and no friction rub.   No murmur heard. Pulmonary/Chest: Effort normal. No accessory muscle usage or stridor. Not tachypneic. No respiratory distress. She has no decreased breath sounds. She has wheezes in the right middle field and the left middle field. She has rhonchi in the right middle field and the left middle field. She has no rales. She exhibits no tenderness.  Abdominal: Soft. Bowel sounds are normal. She  exhibits no distension. There is no tenderness. There is no rebound and no guarding.  Musculoskeletal: Normal range of motion. She exhibits no edema and no tenderness.  Lymphadenopathy:  She has no cervical adenopathy.  Neurological: She is alert and oriented to person, place, and time. She has normal reflexes. No cranial nerve deficit. Coordination normal.  Skin: Skin is warm and dry. No rash noted. She is not diaphoretic. No erythema. No pallor.  Psychiatric: She has a normal mood and affect. Her behavior is normal. Judgment and thought content normal.      Lab Results  Component Value Date   WBC 11.3* 04/11/2011   HGB 13.4 04/11/2011   HCT 40.2 04/11/2011   PLT 206.0 04/11/2011   CHOL 183 04/11/2011   TRIG 126.0 04/11/2011   HDL 46.80 04/11/2011   LDLDIRECT 135.3 11/20/2010   ALT 12 04/11/2011   AST 17 04/11/2011   NA 140 04/11/2011   K 4.7 04/11/2011   CL 102 04/11/2011   CREATININE 1.2 04/11/2011   BUN 24* 04/11/2011   CO2 32 04/11/2011   TSH 1.79 04/11/2011   HGBA1C 6.2 04/11/2011   MICROALBUR 0.5 11/17/2008      Assessment & Plan:

## 2011-04-24 NOTE — Assessment & Plan Note (Signed)
I gave her an injection of depo-medrol IM and started her on advair diskus

## 2011-04-24 NOTE — Patient Instructions (Signed)
Chronic Obstructive Pulmonary Disease (COPD) Chronic obstructive pulmonary disease (COPD) is a condition in which airflow from the lungs is restricted. The lungs can never return to normal, but there are measures you can take which will improve them and make you feel better. CAUSES  Smoking.   Breathing in irritants (pollution, cigarette smoke, strong odors, aerosol sprays, paint fumes).   History of lung infections.  TREATMENT  Treatment focuses on making you comfortable (supportive care).  HOME CARE INSTRUCTIONS  If you smoke, stop smoking. The carbon monoxide buildup in the blood robs you of your already short oxygen supply.   Take medicines (antibiotics) that kill germs as directed.   Avoid antihistamines and cough syrups. They dry up your system and slow down the elimination of secretions. This decreases respiratory capacity and may lead to infections.   Drink enough water and fluids to keep your urine clear or pale yellow. This loosens secretions.   Use humidifiers at home and at your bedside if they do not make breathing difficult.   Receive all protective vaccines your caregiver suggests, especially pneumococcal and influenza.   Use home oxygen as suggested.  SEEK MEDICAL CARE IF:  You develop pus-like mucus (sputum).   You have an oral temperature above 100.5.   Breathing is more labored or exercise becomes difficult to do.   You are running out of the medicine you take for your breathing.  SEEK IMMEDIATE MEDICAL CARE IF:  You have a rapid heart rate.   You have agitation, confusion, tremors, or are in a stupor (family members may need to observe this).   It becomes difficult to breathe.   You develop chest pain.   You have an oral temperature above 100.5, not controlled by medicine.  MAKE SURE YOU:   Understand these instructions.   Will watch your condition.   Will get help right away if you are not doing well or get worse.  Document Released:  04/25/2005 Document Re-Released: 10/10/2009 [facility] Patient Information 2011 [place].

## 2011-06-20 ENCOUNTER — Encounter: Payer: Self-pay | Admitting: Internal Medicine

## 2011-06-20 ENCOUNTER — Ambulatory Visit (INDEPENDENT_AMBULATORY_CARE_PROVIDER_SITE_OTHER): Payer: Medicare Other | Admitting: Internal Medicine

## 2011-06-20 ENCOUNTER — Other Ambulatory Visit (INDEPENDENT_AMBULATORY_CARE_PROVIDER_SITE_OTHER): Payer: Medicare Other

## 2011-06-20 VITALS — BP 138/78 | HR 68 | Temp 99.3°F | Resp 16 | Ht 60.0 in | Wt 229.5 lb

## 2011-06-20 DIAGNOSIS — R7989 Other specified abnormal findings of blood chemistry: Secondary | ICD-10-CM

## 2011-06-20 DIAGNOSIS — N259 Disorder resulting from impaired renal tubular function, unspecified: Secondary | ICD-10-CM

## 2011-06-20 DIAGNOSIS — E538 Deficiency of other specified B group vitamins: Secondary | ICD-10-CM

## 2011-06-20 DIAGNOSIS — R252 Cramp and spasm: Secondary | ICD-10-CM

## 2011-06-20 DIAGNOSIS — I10 Essential (primary) hypertension: Secondary | ICD-10-CM

## 2011-06-20 LAB — CBC WITH DIFFERENTIAL/PLATELET
Basophils Relative: 0.4 % (ref 0.0–3.0)
Eosinophils Absolute: 0.3 10*3/uL (ref 0.0–0.7)
HCT: 36.5 % (ref 36.0–46.0)
Lymphs Abs: 2.2 10*3/uL (ref 0.7–4.0)
MCHC: 33.4 g/dL (ref 30.0–36.0)
MCV: 84.4 fl (ref 78.0–100.0)
Monocytes Absolute: 0.8 10*3/uL (ref 0.1–1.0)
Neutrophils Relative %: 73.6 % (ref 43.0–77.0)
RBC: 4.33 Mil/uL (ref 3.87–5.11)

## 2011-06-20 LAB — COMPREHENSIVE METABOLIC PANEL
AST: 17 U/L (ref 0–37)
Albumin: 3.6 g/dL (ref 3.5–5.2)
Alkaline Phosphatase: 84 U/L (ref 39–117)
BUN: 23 mg/dL (ref 6–23)
Creatinine, Ser: 1.3 mg/dL — ABNORMAL HIGH (ref 0.4–1.2)
Glucose, Bld: 125 mg/dL — ABNORMAL HIGH (ref 70–99)
Potassium: 3.9 mEq/L (ref 3.5–5.1)
Total Bilirubin: 0.5 mg/dL (ref 0.3–1.2)

## 2011-06-20 LAB — MAGNESIUM: Magnesium: 2 mg/dL (ref 1.5–2.5)

## 2011-06-20 LAB — FOLATE: Folate: 9 ng/mL (ref >5.9)

## 2011-06-20 LAB — CK: Total CK: 31 U/L (ref 7–177)

## 2011-06-20 LAB — SEDIMENTATION RATE: Sed Rate: 40 mm/hr — ABNORMAL HIGH (ref 0–22)

## 2011-06-20 MED ORDER — POTASSIUM CHLORIDE 8 MEQ PO TBCR: 8.0000 meq | EXTENDED_RELEASE_TABLET | Freq: Every day | ORAL | Status: DC

## 2011-06-20 NOTE — Progress Notes (Signed)
Subjective:    Patient ID: [name], female    DOB: 1940-08-14, 70 y.o.   MRN: [id number]  Anemia Presents for follow-up visit. There has been no abdominal pain, anorexia, bruising/bleeding easily, confusion, fever, light-headedness, malaise/fatigue, pallor, palpitations, paresthesias, pica or weight loss. Signs of blood loss that are not present include hematemesis, hematochezia, melena and vaginal bleeding. There are no compliance problems.  Compliance with medications is 0-25%.  Hypertension This is a chronic problem. The current episode started more than 1 year ago. The problem has been gradually improving since onset. The problem is controlled. Pertinent negatives include no anxiety, blurred vision, chest pain, headaches, malaise/fatigue, neck pain, orthopnea, palpitations, peripheral edema, PND, shortness of breath or sweats. There are no associated agents to hypertension. Past treatments include angiotensin blockers and diuretics.      Review of Systems  Constitutional: Negative for fever, chills, weight loss, malaise/fatigue, diaphoresis, activity change, appetite change, fatigue and unexpected weight change.  HENT: Negative.  Negative for neck pain.   Eyes: Negative.  Negative for blurred vision.  Respiratory: Negative for apnea, cough, choking, chest tightness, shortness of breath, wheezing and stridor.   Cardiovascular: Negative for chest pain, palpitations, orthopnea, leg swelling and PND.  Gastrointestinal: Negative for nausea, vomiting, abdominal pain, diarrhea, constipation, melena, hematochezia, anal bleeding, anorexia and hematemesis.  Genitourinary: Negative for dysuria, urgency, frequency, hematuria, decreased urine volume, vaginal bleeding, enuresis, difficulty urinating and dyspareunia.  Musculoskeletal: Positive for myalgias (cramps in hands and feet). Negative for back pain, joint swelling, arthralgias and gait problem.  Skin: Negative for color change, pallor,  rash and wound.  Neurological: Negative for dizziness, tremors, seizures, syncope, facial asymmetry, speech difficulty, weakness, light-headedness, numbness, headaches and paresthesias.  Hematological: Negative for adenopathy. Does not bruise/bleed easily.  Psychiatric/Behavioral: Negative for suicidal ideas, behavioral problems, confusion, sleep disturbance and agitation. The patient is not nervous/anxious.        Objective:   Physical Exam  Vitals reviewed. Constitutional: She is oriented to person, place, and time. She appears well-developed and well-nourished. No distress.  HENT:  Head: Normocephalic and atraumatic.  Mouth/Throat: Oropharynx is clear and moist. No oropharyngeal exudate.  Eyes: Conjunctivae are normal. Right eye exhibits no discharge. Left eye exhibits no discharge. No scleral icterus.  Neck: Normal range of motion. Neck supple. No JVD present. No tracheal deviation present. No thyromegaly present.  Cardiovascular: Normal rate, regular rhythm, normal heart sounds and intact distal pulses.  Exam reveals no gallop and no friction rub.   No murmur heard. Pulmonary/Chest: Effort normal and breath sounds normal. No stridor. No respiratory distress. She has no wheezes. She has no rales. She exhibits no tenderness.  Abdominal: Soft. Bowel sounds are normal. She exhibits no distension and no mass. There is no tenderness. There is no rebound and no guarding.  Musculoskeletal: Normal range of motion. She exhibits edema (trace edema in both legs). She exhibits no tenderness.  Lymphadenopathy:    She has no cervical adenopathy.  Neurological: She is oriented to person, place, and time.  Skin: Skin is warm and dry. No rash noted. She is not diaphoretic. No erythema. No pallor.  Psychiatric: She has a normal mood and affect. Her behavior is normal. Judgment and thought content normal.      Lab Results  Component Value Date   WBC 11.3* 04/11/2011   HGB 13.4 04/11/2011   HCT 40.2  04/11/2011   PLT 206.0 04/11/2011   GLUCOSE 93 04/11/2011   CHOL 183 04/11/2011   TRIG  126.0 04/11/2011   HDL 46.80 04/11/2011   LDLDIRECT 135.3 11/20/2010   LDLCALC 111* 04/11/2011   ALT 12 04/11/2011   AST 17 04/11/2011   NA 140 04/11/2011   K 4.7 04/11/2011   CL 102 04/11/2011   CREATININE 1.2 04/11/2011   BUN 24* 04/11/2011   CO2 32 04/11/2011   TSH 1.79 04/11/2011   HGBA1C 6.2 04/11/2011   MICROALBUR 0.5 11/17/2008      Assessment & Plan:

## 2011-06-20 NOTE — Patient Instructions (Signed)

## 2011-06-22 LAB — HEMOGLOBIN A1C: Hgb A1c MFr Bld: 6.3 % (ref 4.6–6.5)

## 2011-06-22 LAB — VITAMIN B12: Vitamin B-12: 283 pg/mL (ref 211–911)

## 2011-06-24 ENCOUNTER — Encounter: Payer: Self-pay | Admitting: Internal Medicine

## 2011-06-24 NOTE — Assessment & Plan Note (Signed)
I will check her B12 and folate levels today

## 2011-06-24 NOTE — Assessment & Plan Note (Signed)
I think this is an issue with her K+ level so I have started her on a K+ replacement, today I will check her ESR and CPK level to look for myopathy or inflammatory process

## 2011-06-24 NOTE — Assessment & Plan Note (Signed)
Her BP is well controlled, I will check her lytes and renal function 

## 2011-06-24 NOTE — Assessment & Plan Note (Signed)
I will check her a1c today to see is she has developed DM II

## 2011-06-24 NOTE — Assessment & Plan Note (Signed)
I will monitor her renal function today

## 2011-07-14 ENCOUNTER — Other Ambulatory Visit: Payer: Self-pay | Admitting: Internal Medicine

## 2011-08-22 ENCOUNTER — Telehealth: Payer: Self-pay

## 2011-08-22 DIAGNOSIS — I10 Essential (primary) hypertension: Secondary | ICD-10-CM

## 2011-08-22 MED ORDER — NEBIVOLOL HCL 5 MG PO TABS: 5.0000 mg | ORAL_TABLET | Freq: Every day | ORAL | Status: DC

## 2011-08-22 NOTE — Telephone Encounter (Signed)
A user error has taken place: encounter opened in error, closed for administrative reasons.

## 2011-08-27 ENCOUNTER — Other Ambulatory Visit: Payer: Self-pay

## 2011-08-27 DIAGNOSIS — I10 Essential (primary) hypertension: Secondary | ICD-10-CM

## 2011-08-27 MED ORDER — NEBIVOLOL HCL 5 MG PO TABS: 5.0000 mg | ORAL_TABLET | Freq: Every day | ORAL | Status: DC

## 2011-09-25 ENCOUNTER — Encounter: Payer: Self-pay | Admitting: Internal Medicine

## 2011-09-25 ENCOUNTER — Ambulatory Visit (INDEPENDENT_AMBULATORY_CARE_PROVIDER_SITE_OTHER)
Admission: RE | Admit: 2011-09-25 | Discharge: 2011-09-25 | Disposition: A | Discharge status: Home / Self Care | Payer: Medicare Other | Source: Ambulatory Visit | Attending: Internal Medicine | Admitting: Internal Medicine

## 2011-09-25 ENCOUNTER — Ambulatory Visit (INDEPENDENT_AMBULATORY_CARE_PROVIDER_SITE_OTHER): Payer: Medicare Other | Admitting: Internal Medicine

## 2011-09-25 ENCOUNTER — Telehealth: Payer: Self-pay | Admitting: *Deleted

## 2011-09-25 ENCOUNTER — Other Ambulatory Visit (INDEPENDENT_AMBULATORY_CARE_PROVIDER_SITE_OTHER): Payer: Medicare Other

## 2011-09-25 DIAGNOSIS — E785 Hyperlipidemia, unspecified: Secondary | ICD-10-CM

## 2011-09-25 DIAGNOSIS — T148XXA Other injury of unspecified body region, initial encounter: Secondary | ICD-10-CM

## 2011-09-25 DIAGNOSIS — I10 Essential (primary) hypertension: Secondary | ICD-10-CM

## 2011-09-25 DIAGNOSIS — L089 Local infection of the skin and subcutaneous tissue, unspecified: Secondary | ICD-10-CM | POA: Insufficient documentation

## 2011-09-25 DIAGNOSIS — E1165 Type 2 diabetes mellitus with hyperglycemia: Secondary | ICD-10-CM

## 2011-09-25 DIAGNOSIS — M25569 Pain in unspecified knee: Secondary | ICD-10-CM

## 2011-09-25 DIAGNOSIS — N058 Unspecified nephritic syndrome with other morphologic changes: Secondary | ICD-10-CM

## 2011-09-25 DIAGNOSIS — E1129 Type 2 diabetes mellitus with other diabetic kidney complication: Secondary | ICD-10-CM

## 2011-09-25 DIAGNOSIS — R609 Edema, unspecified: Secondary | ICD-10-CM

## 2011-09-25 DIAGNOSIS — Z23 Encounter for immunization: Secondary | ICD-10-CM

## 2011-09-25 DIAGNOSIS — N259 Disorder resulting from impaired renal tubular function, unspecified: Secondary | ICD-10-CM

## 2011-09-25 DIAGNOSIS — M25561 Pain in right knee: Secondary | ICD-10-CM

## 2011-09-25 LAB — CBC WITH DIFFERENTIAL/PLATELET
Basophils Relative: 1.1 % (ref 0.0–3.0)
Hemoglobin: 13.8 g/dL (ref 12.0–15.0)
Lymphocytes Relative: 20.6 % (ref 12.0–46.0)
MCHC: 33.1 g/dL (ref 30.0–36.0)
Monocytes Relative: 9.1 % (ref 3.0–12.0)
Neutro Abs: 7.4 10*3/uL (ref 1.4–7.7)
RBC: 4.96 Mil/uL (ref 3.87–5.11)

## 2011-09-25 LAB — COMPREHENSIVE METABOLIC PANEL
BUN: 28 mg/dL — ABNORMAL HIGH (ref 6–23)
CO2: 32 mEq/L (ref 19–32)
Calcium: 9.9 mg/dL (ref 8.4–10.5)
Chloride: 103 mEq/L (ref 96–112)
Creatinine, Ser: 1.3 mg/dL — ABNORMAL HIGH (ref 0.4–1.2)
GFR: 43.67 mL/min — ABNORMAL LOW (ref >60.00)
Glucose, Bld: 103 mg/dL — ABNORMAL HIGH (ref 70–99)

## 2011-09-25 LAB — URINALYSIS, ROUTINE W REFLEX MICROSCOPIC
Bilirubin Urine: NEGATIVE
Ketones, ur: NEGATIVE
Leukocytes, UA: NEGATIVE
pH: 6 (ref 5.0–8.0)

## 2011-09-25 LAB — HEMOGLOBIN A1C: Hgb A1c MFr Bld: 6.2 % (ref 4.6–6.5)

## 2011-09-25 MED ORDER — CEFUROXIME AXETIL 500 MG PO TABS: 500.0000 mg | ORAL_TABLET | Freq: Two times a day (BID) | ORAL | Status: AC

## 2011-09-25 MED ORDER — OLMESARTAN MEDOXOMIL-HCTZ 40-25 MG PO TABS: 1.0000 | ORAL_TABLET | Freq: Every day | ORAL | Status: DC

## 2011-09-25 NOTE — Patient Instructions (Signed)
Diabetes, Type 2 Diabetes is a long-lasting (chronic) disease. In type 2 diabetes, the pancreas does not make enough insulin (a hormone), and the body does not respond normally to the insulin that is made. This type of diabetes was also previously called adult-onset diabetes. It usually occurs after the age of 45, but it can occur at any age.  CAUSES  Type 2 diabetes happens because the pancreasis not making enough insulin or your body has trouble using the insulin that your pancreas does make properly. SYMPTOMS   Drinking more than usual.   Urinating more than usual.   Blurred vision.   Dry, itchy skin.   Frequent infections.   Feeling more tired than usual (fatigue).  DIAGNOSIS The diagnosis of type 2 diabetes is usually made by one of the following tests:  Fasting blood glucose test. You will not eat for at least 8 hours and then take a blood test.   Random blood glucose test. Your blood glucose (sugar) is checked at any time of the day regardless of when you ate.   Oral glucose tolerance test (OGTT). Your blood glucose is measured after you have not eaten (fasted) and then after you drink a glucose containing beverage.  TREATMENT   Healthy eating.   Exercise.   Medicine, if needed.   Monitoring blood glucose.   Seeing your caregiver regularly.  HOME CARE INSTRUCTIONS   Check your blood glucose at least once a day. More frequent monitoring may be necessary, depending on your medicines and on how well your diabetes is controlled. Your caregiver will advise you.   Take your medicine as directed by your caregiver.   Do not smoke.   Make wise food choices. Ask your caregiver for information. Weight loss can improve your diabetes.   Learn about low blood glucose (hypoglycemia) and how to treat it.   Get your eyes checked regularly.   Have a yearly physical exam. Have your blood pressure checked and your blood and urine tested.   Wear a pendant or bracelet saying  that you have diabetes.   Check your feet every night for cuts, sores, blisters, and redness. Let your caregiver know if you have any problems.  SEEK MEDICAL CARE IF:   You have problems keeping your blood glucose in target range.   You have problems with your medicines.   You have symptoms of an illness that do not improve after 24 hours.   You have a sore or wound that is not healing.   You notice a change in vision or a new problem with your vision.   You have a fever.  MAKE SURE YOU:  Understand these instructions.   Will watch your condition.   Will get help right away if you are not doing well or get worse.  Document Released: 07/16/2005 Document Revised: 03/29/2011 Document Reviewed: 01/01/2011 [facility] Patient Information 2012 [place].Wound Infection A wound infection happens when a type of germ (bacteria) starts growing in the wound. In some cases, this can cause the wound to break open. If cared for properly, the infected wound will heal from the inside to the outside. Wound infections need treatment. CAUSES An infection is caused by bacteria growing in the wound.  SYMPTOMS   Increase in redness, swelling, or pain at the wound site.   Increase in drainage at the wound site.   Wound or bandage (dressing) starts to smell bad.   Fever.   Feeling tired or fatigued.   Pus draining from  the wound.  TREATMENT  You caregiver will prescribe antibiotic medicine. The wound infection should improve within 24 to 48 hours. Any redness around the wound should stop spreading and the wound should be less painful.  HOME CARE INSTRUCTIONS   Only take over-the-counter or prescription medicines for pain, discomfort, or fever as directed by your caregiver.   Take your antibiotics as directed. Finish them even if you start to feel better.   Gently wash the area with mild soap and water 2 times a day, or as directed. Rinse off the soap. Pat the area dry with a clean towel.  Do not rub the wound. This may cause bleeding.   Follow your caregiver's instructions for how often you need to change the dressing.   Apply ointment and a dressing to the wound as directed.   If the dressing sticks, moisten it with soapy water and gently remove it.   Change the bandage right away if it becomes wet, dirty, or develops a bad smell.   Take showers. Do not take tub baths, swim, or do anything that may soak the wound until it is healed.   Avoid exercises that make you sweat heavily.   Use anti-itch medicine as directed by your caregiver. The wound may itch when it is healing. Do not pick or scratch at the wound.   Follow up with your caregiver to get your wound rechecked as directed.  SEEK MEDICAL CARE IF:  You have an increase in swelling, pain, or redness around the wound.   You have an increase in the amount of pus coming from the wound.   There is a bad smell coming from the wound.   More of the wound breaks open.   You have a fever.  MAKE SURE YOU:   Understand these instructions.   Will watch your condition.   Will get help right away if you are not doing well or get worse.  Document Released: 04/14/2003 Document Revised: 03/28/2011 Document Reviewed: 11/19/2010 [facility] Patient Information 2012 [place].

## 2011-09-25 NOTE — Telephone Encounter (Signed)
Left msg on vm md gave samples of celebrex not sure how she needs to take med... 09/25/11@3 :19pm/LMB

## 2011-09-25 NOTE — Progress Notes (Signed)
Subjective:    Patient ID: [name], female    DOB: [birth year], 71 y.o.   MRN: [id number]  HPI She returns for f/up and she tells me that she cut her right lower leg about one month ago and she feels like the wound is not healing, there has been some drainage as well. She has been applying neosporin.  Also, she complains of worsening right knee pain with no history of trauma or injury. She has tried some aleve with no relief from her symptoms.   Review of Systems  Constitutional: Negative for fever, chills, diaphoresis, activity change, appetite change, fatigue and unexpected weight change.  HENT: Negative.   Eyes: Negative.   Respiratory: Negative for cough, choking, chest tightness, shortness of breath, wheezing and stridor.   Cardiovascular: Positive for leg swelling. Negative for chest pain and palpitations.  Gastrointestinal: Negative for nausea, vomiting, abdominal pain, diarrhea, constipation, abdominal distention and anal bleeding.  Genitourinary: Negative.   Musculoskeletal: Positive for arthralgias. Negative for myalgias, back pain, joint swelling and gait problem.  Skin: Positive for wound. Negative for color change, pallor and rash.  Neurological: Negative for dizziness, tremors, seizures, syncope, facial asymmetry, speech difficulty, weakness, light-headedness, numbness and headaches.  Hematological: Negative for adenopathy. Does not bruise/bleed easily.  Psychiatric/Behavioral: Negative.        Objective:   Physical Exam  Vitals reviewed. Constitutional: She is oriented to person, place, and time. She appears well-developed and well-nourished. No distress.  HENT:  Head: Normocephalic and atraumatic.  Nose: Nose normal.  Mouth/Throat: Oropharynx is clear and moist. No oropharyngeal exudate.  Eyes: Conjunctivae are normal. Right eye exhibits no discharge. Left eye exhibits no discharge. No scleral icterus.  Neck: Normal range of motion. Neck supple. No JVD  present. No tracheal deviation present. No thyromegaly present.  Cardiovascular: Normal rate, regular rhythm, normal heart sounds and intact distal pulses.  Exam reveals no gallop and no friction rub.   No murmur heard. Pulmonary/Chest: Effort normal and breath sounds normal. No stridor. No respiratory distress. She has no wheezes. She has no rales. She exhibits no tenderness.  Abdominal: Soft. Bowel sounds are normal. She exhibits no distension and no mass. There is no tenderness. There is no rebound and no guarding.  Musculoskeletal: Normal range of motion. She exhibits edema (1+ edema in BLE). She exhibits no tenderness.       Right knee: She exhibits swelling and deformity (DJD changes). She exhibits normal range of motion, no effusion, no ecchymosis, no laceration, no erythema, normal alignment, no LCL laxity, normal patellar mobility, no bony tenderness, normal meniscus and no MCL laxity. no tenderness found.  Lymphadenopathy:    She has no cervical adenopathy.  Neurological: She is oriented to person, place, and time.  Skin: Skin is warm and dry. Lesion noted. No abrasion, no bruising, no burn, no ecchymosis, no laceration and no rash noted. She is not diaphoretic. No erythema. No pallor.     Psychiatric: She has a normal mood and affect. Her behavior is normal. Judgment and thought content normal.      Lab Results  Component Value Date   WBC 12.4* 06/20/2011   HGB 12.2 06/20/2011   HCT 36.5 06/20/2011   PLT 212.0 06/20/2011   GLUCOSE 125* 06/20/2011   CHOL 183 04/11/2011   TRIG 126.0 04/11/2011   HDL 46.80 04/11/2011   LDLDIRECT 135.3 11/20/2010   LDLCALC 111* 04/11/2011   ALT 17 06/20/2011   AST 17 06/20/2011   NA 141 06/20/2011  K 3.9 06/20/2011   CL 102 06/20/2011   CREATININE 1.3* 06/20/2011   BUN 23 06/20/2011   CO2 31 06/20/2011   TSH 1.79 04/11/2011   HGBA1C 6.3 06/20/2011   MICROALBUR 0.5 11/17/2008      Assessment & Plan:

## 2011-09-26 NOTE — Telephone Encounter (Signed)
Called pt no answer LMOM md response... 09/26/11@1 :48pm/LMB

## 2011-09-26 NOTE — Telephone Encounter (Signed)
For knee pain

## 2011-09-28 LAB — WOUND CULTURE: Gram Stain: NONE SEEN

## 2011-09-29 MED ORDER — CELECOXIB 200 MG PO CAPS: 200.0000 mg | ORAL_CAPSULE | Freq: Every day | ORAL | Status: DC

## 2011-09-29 NOTE — Assessment & Plan Note (Signed)
Continue HCTZ

## 2011-09-29 NOTE — Assessment & Plan Note (Signed)
BMP and UA today to check her renal function

## 2011-09-29 NOTE — Assessment & Plan Note (Signed)
Wound culture was done, start ceftin empirically for infection, [facility]

## 2011-09-29 NOTE — Assessment & Plan Note (Signed)
Check a plain film and start celebrex for relief of the pain

## 2011-09-29 NOTE — Assessment & Plan Note (Signed)
Her BP is adequately controlled, I will check her lytes and renal function

## 2011-09-29 NOTE — Assessment & Plan Note (Signed)
a1c check today

## 2011-10-10 ENCOUNTER — Other Ambulatory Visit (INDEPENDENT_AMBULATORY_CARE_PROVIDER_SITE_OTHER): Payer: Medicare Other

## 2011-10-10 ENCOUNTER — Ambulatory Visit (INDEPENDENT_AMBULATORY_CARE_PROVIDER_SITE_OTHER): Payer: Medicare Other | Admitting: Internal Medicine

## 2011-10-10 ENCOUNTER — Encounter: Payer: Self-pay | Admitting: Internal Medicine

## 2011-10-10 VITALS — BP 116/86 | HR 84 | Temp 97.8°F | Resp 16 | Wt 235.0 lb

## 2011-10-10 DIAGNOSIS — L97909 Non-pressure chronic ulcer of unspecified part of unspecified lower leg with unspecified severity: Secondary | ICD-10-CM

## 2011-10-10 DIAGNOSIS — L97919 Non-pressure chronic ulcer of unspecified part of right lower leg with unspecified severity: Secondary | ICD-10-CM

## 2011-10-10 DIAGNOSIS — R609 Edema, unspecified: Secondary | ICD-10-CM

## 2011-10-10 DIAGNOSIS — R0602 Shortness of breath: Secondary | ICD-10-CM

## 2011-10-10 DIAGNOSIS — I10 Essential (primary) hypertension: Secondary | ICD-10-CM

## 2011-10-10 DIAGNOSIS — L089 Local infection of the skin and subcutaneous tissue, unspecified: Secondary | ICD-10-CM

## 2011-10-10 DIAGNOSIS — N259 Disorder resulting from impaired renal tubular function, unspecified: Secondary | ICD-10-CM

## 2011-10-10 DIAGNOSIS — I509 Heart failure, unspecified: Secondary | ICD-10-CM

## 2011-10-10 DIAGNOSIS — E785 Hyperlipidemia, unspecified: Secondary | ICD-10-CM

## 2011-10-10 DIAGNOSIS — D72829 Elevated white blood cell count, unspecified: Secondary | ICD-10-CM

## 2011-10-10 HISTORY — DX: Non-pressure chronic ulcer of unspecified part of right lower leg with unspecified severity: L97.919

## 2011-10-10 LAB — CBC WITH DIFFERENTIAL/PLATELET
Eosinophils Relative: 3.4 % (ref 0.0–5.0)
Hemoglobin: 12.6 g/dL (ref 12.0–15.0)
Lymphs Abs: 2.1 10*3/uL (ref 0.7–4.0)
Monocytes Relative: 7.7 % (ref 3.0–12.0)
Platelets: 199 10*3/uL (ref 150.0–400.0)
RBC: 4.6 Mil/uL (ref 3.87–5.11)

## 2011-10-10 LAB — BASIC METABOLIC PANEL
CO2: 34 mEq/L — ABNORMAL HIGH (ref 19–32)
Calcium: 9.9 mg/dL (ref 8.4–10.5)
Chloride: 103 mEq/L (ref 96–112)
Glucose, Bld: 92 mg/dL (ref 70–99)
Potassium: 5 mEq/L (ref 3.5–5.1)
Sodium: 141 mEq/L (ref 135–145)

## 2011-10-10 MED ORDER — LOSARTAN POTASSIUM 100 MG PO TABS: 100.0000 mg | ORAL_TABLET | Freq: Every day | ORAL | Status: DC

## 2011-10-10 MED ORDER — FUROSEMIDE 40 MG PO TABS: 40.0000 mg | ORAL_TABLET | Freq: Two times a day (BID) | ORAL | Status: DC

## 2011-10-10 NOTE — Patient Instructions (Signed)
Heart Failure Heart failure (HF) is a condition in which the heart has trouble pumping blood. This means your heart does not pump blood efficiently for your body to work well. In some cases of HF, fluid may back up into your lungs or you may have swelling (edema) in your lower legs. HF is a long-term (chronic) condition. It is important for you to take good care of yourself and follow your caregiver's treatment plan. CAUSES   Health conditions:   High blood pressure (hypertension) causes the heart muscle to work harder than normal. When pressure in the blood vessels is high, the heart needs to pump (contract) with more force in order to circulate blood throughout the body. High blood pressure eventually causes the heart to become stiff and weak.   Coronary artery disease (CAD) is the buildup of cholesterol and fat (plaques) in the arteries of the heart. The blockage in the arteries deprives the heart muscle of oxygen and blood. This can cause chest pain and may lead to a heart attack. High blood pressure can also contribute to CAD.   Heart attack (myocardial infarction) occurs when 1 or more arteries in the heart become blocked. The loss of oxygen damages the muscle tissue of the heart. When this happens, part of the heart muscle dies. The injured tissue does not contract as well and weakens the heart's ability to pump blood.   Abnormal heart valves can cause HF when the heart valves do not open and close properly. This makes the heart muscle pump harder to keep the blood flowing.   Heart muscle disease (cardiomyopathy or myocarditis) is damage to the heart muscle from a variety of causes. These can include drug or alcohol abuse, infections, or unknown reasons. These can increase the risk of HF.   Lung disease makes the heart work harder because the lungs do not work properly. This can cause a strain on the heart leading it to fail.   Diabetes increases the risk of HF. High blood sugar contributes  to high fat (lipid) levels in the blood. Diabetes can also cause slow damage to tiny blood vessels that carry important nutrients to the heart muscle. When the heart does not get enough oxygen and food, it can cause the heart to become weak and stiff. This leads to a heart that does not contract efficiently.   Other diseases can contribute to HF. These include abnormal heart rhythms, thyroid problems, and low blood counts (anemia).   Unhealthy lifestyle habits:   Obesity.   Smoking.   Eating foods high in fat and cholesterol.   Eating or drinking beverages high in salt.   Drug or alcohol abuse.   Lack of exercise.  SYMPTOMS  HF symptoms may vary and can be hard to detect. Symptoms may include:  Shortness of breath with activity, such as climbing stairs.   Persistent cough.   Swelling of the feet, ankles, legs, or abdomen.   Unexplained weight gain.   Difficulty breathing when lying flat.   Waking from sleep because of the need to sit up and get more air.   Rapid heartbeat.   Fatigue and loss of energy.   Feeling lightheaded or close to fainting.  DIAGNOSIS  A diagnosis of HF is based on your history, symptoms, physical examination, and diagnostic tests. Diagnostic tests for HF may include:  EKG.   Chest X-ray.   Blood tests.   Exercise stress test.   Blood oxygen test (arterial blood gas).   Evaluation   by a heart doctor (cardiologist).   Ultrasound evaluation of the heart (echocardiogram).   Heart artery test to look for blockages (angiogram).   Radioactive imaging to look at the heart (radionuclide test).  TREATMENT  Treatment is aimed at managing the symptoms of HF. Medicines, lifestyle changes, or surgical intervention may be necessary to treat HF.  Medicines to help treat HF may include:   Angiotensin-converting enzyme (ACE) inhibitors. These block the effects of a blood protein called angiotensin-converting enzyme. ACE inhibitors relax (dilate) the  blood vessels and help lower blood pressure. This decreases the workload of the heart, slows the progression of HF, and improves symptoms.   Angiotensin receptor blockers (ARBs). These medications work similar to ACE inhibitors. ARBs may be an alternative for people who cannot tolerate an ACE inhibitor.   Aldosterone antagonists. This medication helps get rid of extra fluid from your body. This lowers the volume of blood the heart has to pump.   Water pills (diuretics). Diuretics cause the kidneys to remove salt and water from the blood. The extra fluid is removed by urination. By removing extra fluid from the body, diuretics help lower the workload of the heart and help prevent fluid buildup in the lungs so breathing is easier.   Beta blockers. These prevent the heart from beating too fast and improve heart muscle strength. Beta blockers help maintain a normal heart rate, control blood pressure, and improve HF symptoms.   Digitalis. This increases the force of the heartbeat and may be helpful to people with HF or heart rhythm problems.   Healthy lifestyle changes include:   Stopping smoking.   Eating a healthy diet. Avoid foods high in fat. Avoid foods fried in oil or made with fat. A dietician can help with healthy food choices.   Limiting how much salt you eat.   Limiting alcohol intake to no more than 1 drink per day for women and 2 drinks per day for men. Drinking more than that is harmful to your heart. If your heart has already been damaged by alcohol or you have severe HF, drinking alcohol should be stopped completely.   Exercising as directed by your caregiver.   Surgical treatment for HF may include:   Procedures to open blocked arteries, repair damaged heart valves, or remove damaged heart muscle tissue.   A pacemaker to help heart muscle function and to control certain abnormal heart rhythms.   A defibrillator to possibly prevent sudden cardiac death.  HOME CARE  INSTRUCTIONS   Activity level. Your caregiver can help you determine what type of exercise program may be helpful. It is important to maintain your strength. Pace your physical activity to avoid shortness of breath or chest pain. Rest for 1 hour before and after meals. A cardiac rehabilitation program may be helpful to some people with HF.   Diet. Eat a heart healthy diet. Food choices should be low in saturated fat and cholesterol. Talk to a dietician to learn about heart healthy foods.   Salt intake. When you have HF, you need to limit the amount of salt you eat. Eat less than 1500 milligrams (mg) of salt per day or as recommended by your caregiver.   Weight monitoring. Weigh yourself every day. You should weigh yourself in the morning after you urinate and before you eat breakfast. Wear the same amount of clothing each time you weigh yourself. Record your weight daily. Bring your recorded weights to your clinic visits. Tell your caregiver right away if   you have gained 3 lb/1.4 kg in 1 day, or 5 lb/2.3 kg in a week or whatever amount you were told to report.   Blood pressure monitoring. This should be done as directed by your caregiver. A home blood pressure cuff can be purchased at a drugstore. Record your blood pressure numbers and bring them to your clinic visits. Tell your caregiver if you become dizzy or lightheaded upon standing up.   Smoking. If you are currently a smoker, it is time to quit. Nicotine makes your heart work harder by causing your blood vessels to constrict. Do not use nicotine gum or patches before talking to your caregiver.   Follow up. Be sure to schedule a follow-up visit with your caregiver. Keep all your appointments.  SEEK MEDICAL CARE IF:   Your weight increases by 3 lb/1.4 kg in 1 day or 5 lb/2.3 kg in a week.   You notice increasing shortness of breath that is unusual for you. This may happen during rest, sleep, or with activity.   You cough more than normal,  especially with physical activity.   You notice more swelling in your hands, feet, ankles, or belly (abdomen).   You are unable to sleep because it is hard to breathe.   You cough up bloody mucus (sputum).   You begin to feel "jumping" or "fluttering" sensations (palpitations) in your chest.  SEEK IMMEDIATE MEDICAL CARE IF:   You have severe chest pain or pressure which may include symptoms such as:   Pain or pressure in the arms, neck, jaw, or back.   Feeling sweaty.   Feeling sick to your stomach (nauseous).   Feeling short of breath while at rest.   Having a fast or irregular heartbeat.   You experience stroke symptoms. These symptoms include:   Facial weakness or numbness.   Weakness or numbness in an arm, leg, or on one side of your body.   Blurred vision.   Difficulty talking or thinking.   Dizziness or fainting.   Severe headache.  THESE ARE MEDICAL EMERGENCIES. Do not wait to see if the symptoms go away. Call your local emergency services (911 in [place] DO NOT drive yourself to the hospital. IMPORTANT  Make a list of every medicine, vitamin, or herbal supplement you are taking. Keep the list with you at all times. Show it to your caregiver at every visit. Keep the list up-to-date.   Ask your caregiver or pharmacist to write an explanation of each medicine you are taking. This should include:   Why you are taking it.   The possible side effects.   The best time of day to take it.   Foods to take with it or what foods to avoid.   When to stop taking it.  MAKE SURE YOU:   Understand these instructions.   Will watch your condition.   Will get help right away if you are not doing well or get worse.  Document Released: 07/16/2005 Document Revised: 07/05/2011 Document Reviewed: 10/28/2009 [employer] Patient Information 2012 [employer]. 

## 2011-10-10 NOTE — Assessment & Plan Note (Signed)
She can't afford Benicar-HCT so I changed to losartan and lasix

## 2011-10-10 NOTE — Progress Notes (Signed)
Subjective:    Patient ID: [name], female    DOB: 11/29/1940, 71 y.o.   MRN: [id number]  Hypertension This is a chronic problem. The current episode started more than 1 year ago. The problem has been gradually worsening since onset. The problem is uncontrolled. Associated symptoms include orthopnea, peripheral edema and shortness of breath. Pertinent negatives include no anxiety, blurred vision, chest pain, headaches, malaise/fatigue, neck pain, palpitations, PND or sweats. Agents associated with hypertension include NSAIDs. Past treatments include angiotensin blockers, diuretics and beta blockers. The current treatment provides mild improvement. Compliance problems include medication cost.  Hypertensive end-organ damage includes heart failure.      Review of Systems  Constitutional: Positive for unexpected weight change (weight gain). Negative for fever, chills, malaise/fatigue, diaphoresis, activity change, appetite change and fatigue.  HENT: Negative.  Negative for neck pain.   Eyes: Negative.  Negative for blurred vision.  Respiratory: Positive for shortness of breath. Negative for apnea, cough, choking, chest tightness, wheezing and stridor.   Cardiovascular: Positive for orthopnea. Negative for chest pain, palpitations, leg swelling and PND.  Gastrointestinal: Negative for nausea, vomiting, abdominal pain, diarrhea, constipation, blood in stool, abdominal distention, anal bleeding and rectal pain.  Genitourinary: Negative for dysuria, urgency, frequency, hematuria, flank pain, decreased urine volume, enuresis, difficulty urinating and dyspareunia.  Musculoskeletal: Negative for myalgias, back pain, joint swelling, arthralgias and gait problem.  Skin: Positive for wound (RLE- slightly larger, she did not go to the wound clinic). Negative for color change, pallor and rash.  Neurological: Negative for dizziness, tremors, seizures, syncope, facial asymmetry, speech difficulty,  weakness, light-headedness, numbness and headaches.  Hematological: Negative for adenopathy. Does not bruise/bleed easily.  Psychiatric/Behavioral: Negative.        Objective:   Physical Exam  Vitals reviewed. Constitutional: She is oriented to person, place, and time. She appears well-developed and well-nourished. No distress.  HENT:  Head: Normocephalic and atraumatic.  Mouth/Throat: Oropharynx is clear and moist. No oropharyngeal exudate.  Eyes: Conjunctivae are normal. Right eye exhibits no discharge. Left eye exhibits no discharge. No scleral icterus.  Neck: Normal range of motion. Neck supple. No JVD present. No tracheal deviation present. No thyromegaly present.  Cardiovascular: Normal rate, regular rhythm and intact distal pulses.  Exam reveals gallop. Exam reveals no friction rub.   No murmur heard. Pulmonary/Chest: Effort normal and breath sounds normal. No stridor. No respiratory distress. She has no wheezes. She has no rales. She exhibits no tenderness.  Abdominal: Soft. Bowel sounds are normal. She exhibits no distension and no mass. There is no tenderness. There is no rebound and no guarding.  Musculoskeletal: Normal range of motion. She exhibits edema (2+ pitting edema in BLE's). She exhibits no tenderness.  Lymphadenopathy:    She has no cervical adenopathy.  Neurological: She is oriented to person, place, and time.  Skin: Skin is warm and dry. Abrasion noted. No bruising, no burn, no laceration, no lesion, no petechiae and no rash noted. Rash is not macular, not pustular and not urticarial. She is not diaphoretic. No erythema. No pallor.     Psychiatric: She has a normal mood and affect. Her behavior is normal. Judgment and thought content normal.      Lab Results  Component Value Date   WBC 11.1* 09/25/2011   HGB 13.8 09/25/2011   HCT 41.8 09/25/2011   PLT 213.0 09/25/2011   GLUCOSE 103* 09/25/2011   CHOL 183 04/11/2011   TRIG 126.0 04/11/2011   HDL 46.80 04/11/2011  LDLDIRECT 135.3 11/20/2010   LDLCALC 111* 04/11/2011   ALT 14 09/25/2011   AST 19 09/25/2011   NA 140 09/25/2011   K 5.1 09/25/2011   CL 103 09/25/2011   CREATININE 1.3* 09/25/2011   BUN 28* 09/25/2011   CO2 32 09/25/2011   TSH 1.79 04/11/2011   HGBA1C 6.2 09/25/2011   MICROALBUR 0.5 11/17/2008      Assessment & Plan:

## 2011-10-10 NOTE — Assessment & Plan Note (Signed)
I referred her to the wound care center again

## 2011-10-10 NOTE — Assessment & Plan Note (Signed)
Stop celebrex, change HCTZ to Lasix, check BNP, refer for CHF evaluation, monitor renal function and lytes

## 2011-10-10 NOTE — Assessment & Plan Note (Signed)
Check BNP today, start lasix, cardiology referral

## 2011-10-10 NOTE — Assessment & Plan Note (Signed)
Recheck her renal function today with a BMP

## 2011-10-10 NOTE — Assessment & Plan Note (Signed)
clx was negative

## 2011-10-10 NOTE — Assessment & Plan Note (Signed)
Recheck the CBC again

## 2011-10-16 ENCOUNTER — Encounter (HOSPITAL_BASED_OUTPATIENT_CLINIC_OR_DEPARTMENT_OTHER): Payer: Medicare Other | Attending: General Surgery

## 2011-10-16 DIAGNOSIS — IMO0002 Reserved for concepts with insufficient information to code with codable children: Secondary | ICD-10-CM | POA: Insufficient documentation

## 2011-10-16 DIAGNOSIS — Z79899 Other long term (current) drug therapy: Secondary | ICD-10-CM | POA: Insufficient documentation

## 2011-10-16 DIAGNOSIS — S99929A Unspecified injury of unspecified foot, initial encounter: Secondary | ICD-10-CM | POA: Insufficient documentation

## 2011-10-16 DIAGNOSIS — I872 Venous insufficiency (chronic) (peripheral): Secondary | ICD-10-CM | POA: Insufficient documentation

## 2011-10-16 DIAGNOSIS — S8990XA Unspecified injury of unspecified lower leg, initial encounter: Secondary | ICD-10-CM | POA: Insufficient documentation

## 2011-10-16 DIAGNOSIS — I83009 Varicose veins of unspecified lower extremity with ulcer of unspecified site: Secondary | ICD-10-CM | POA: Insufficient documentation

## 2011-10-16 DIAGNOSIS — I10 Essential (primary) hypertension: Secondary | ICD-10-CM | POA: Insufficient documentation

## 2011-10-16 NOTE — Progress Notes (Signed)
[facility]  [name], [name]               ACCOUNT NO.:  [id number]  MEDICAL RECORD NO.:  [id number]      DATE OF BIRTH:  [birth year]  PHYSICIAN:  Elesa Hacker, M.D.         VISIT DATE:  10/16/2011                                  OFFICE VISIT   CHIEF COMPLAINT:  Wound right leg.  HISTORY OF PRESENT ILLNESS:  This is a 71 year old female, nondiabetic banged her leg on the door approximately 2 months ago.  This has been treated with washing and Neosporin.  She has a nonhealing wound.  In addition, she has a long history of venous stasis changes.  No history of acute thrombophlebitis.  PAST MEDICAL HISTORY:  Significant for: 1. Hypertension. 2. Chronic bronchitis. 3. Asthma. 4. She has been told that she has a heart murmur. 5. She has been treated for ulcerative colitis and multiple kidney     stones.  PAST SURGICAL HISTORY:  Negative.  SOCIAL HISTORY:  Cigarettes none for 15 years.  Alcohol none.  MEDICATIONS:  Albuterol, allopurinol, aspirin, colchicine, Advair, furosemide, losartan, Bystolic, potassium chloride, Spiriva, and tramadol.  ALLERGIES: 1. CELEBREX. 2. ENALAPRIL. 3. AMLODIPINE. 4. AMOXICILLIN. 5. CODEINE. 6. HYDROCODONE.  REVIEW OF SYSTEMS:  She has been treated for gout.  PHYSICAL EXAMINATION:  VITAL SIGNS: Temp 98.2, pulse 57, respirations 19, blood pressure 150/73. GENERAL: Well developed, well nourished, in no distress. HEAD, EYES, EARS, NOSE, THROAT:  Normal to cursory examination. CHEST:  Clear. HEART: Regular rhythm.  I do not hear any murmur. ABDOMEN:  Not examined. EXAMINATION OF EXTREMITIES:  Reveals bilateral stasis changes.  I am not sure that the dorsalis pedis and posterior tibial pulses are palpable. I had difficulty feeling them.  On the right lower extremity in an area of stasis changes, there is a 2.3 x 1.3 relatively superficial wound with a thick crust which cannot be easily debrided.  IMPRESSION:   Posttraumatic wound in area of venous stasis.  PLAN OF TREATMENT:  Arterial and venous studies.  Santyl, Hydrogel, and Profore Lite.  I will see her in 7 days.     Elesa Hacker, M.D.     RA/MEDQ  D:  10/16/2011  T:  10/16/2011  Job:  [id number]  cc:   Dr. Osie Bond

## 2011-10-22 ENCOUNTER — Encounter (INDEPENDENT_AMBULATORY_CARE_PROVIDER_SITE_OTHER): Payer: Medicare Other | Admitting: *Deleted

## 2011-10-22 ENCOUNTER — Ambulatory Visit (INDEPENDENT_AMBULATORY_CARE_PROVIDER_SITE_OTHER): Payer: Medicare Other | Admitting: *Deleted

## 2011-10-22 DIAGNOSIS — L97909 Non-pressure chronic ulcer of unspecified part of unspecified lower leg with unspecified severity: Secondary | ICD-10-CM

## 2011-10-29 NOTE — Procedures (Unsigned)
DUPLEX DEEP VENOUS EXAM - LOWER EXTREMITY  INDICATION:  Nonhealing ulcer.  HISTORY:  Edema:  No. Trauma/Surgery:  Right lateral lower leg wound since January 2013 due to injury on a car door. Pain:  No. PE:  No. Previous DVT:  No. Anticoagulants: Other:  DUPLEX EXAM:               CFV   SFV   PopV  PTV    GSV               R  L  R  L  R  L  R   L  R  L Thrombosis    o  o  o  o  o  o      o  o  o Spontaneous   +  +  +  +  +  +      +  +  + Phasic        +  +  +  +  +  +      +  +  + Augmentation  +  +  +  +  +  +      +  +  + Compressible  +  +  +  +  +  +      +  +  + Competent     0  +  +  0  +  0      +  0  0  Legend:  + - yes  o - no  p - partial  D - decreased  IMPRESSION: 1. No evidence of deep or superficial vein thrombosis noted in the     bilateral lower extremities based on limited visualization. 2. Reflux of >500 milliseconds noted in the right common femoral, left     superficial femoral, left popliteal, and bilateral great saphenous     veins. 3. The right calf veins were not adequately evaluated due to wound     location, bandaging, and patient body habitus. 4. Preliminary findings stating patency, compressibility, and reflux     of >500 milliseconds of the bilateral lower extremities venous     system was faxed to the [facility] on 10/23/2011.   _____________________________ Judeth Cornfield. Scot Dock, M.D.  CH/MEDQ  D:  10/23/2011  T:  10/23/2011  Job:  [id number]

## 2011-10-30 ENCOUNTER — Encounter (HOSPITAL_BASED_OUTPATIENT_CLINIC_OR_DEPARTMENT_OTHER): Payer: Medicare Other | Attending: General Surgery

## 2011-10-30 DIAGNOSIS — I83009 Varicose veins of unspecified lower extremity with ulcer of unspecified site: Secondary | ICD-10-CM | POA: Insufficient documentation

## 2011-10-30 DIAGNOSIS — I10 Essential (primary) hypertension: Secondary | ICD-10-CM | POA: Insufficient documentation

## 2011-10-30 DIAGNOSIS — Z79899 Other long term (current) drug therapy: Secondary | ICD-10-CM | POA: Insufficient documentation

## 2011-11-09 ENCOUNTER — Ambulatory Visit: Payer: Medicare Other | Admitting: Cardiology

## 2011-11-13 ENCOUNTER — Encounter (HOSPITAL_BASED_OUTPATIENT_CLINIC_OR_DEPARTMENT_OTHER): Payer: Medicare Other

## 2011-11-30 ENCOUNTER — Encounter (HOSPITAL_BASED_OUTPATIENT_CLINIC_OR_DEPARTMENT_OTHER): Payer: Medicare Other | Attending: General Surgery

## 2011-11-30 DIAGNOSIS — J4489 Other specified chronic obstructive pulmonary disease: Secondary | ICD-10-CM | POA: Insufficient documentation

## 2011-11-30 DIAGNOSIS — L97909 Non-pressure chronic ulcer of unspecified part of unspecified lower leg with unspecified severity: Secondary | ICD-10-CM | POA: Insufficient documentation

## 2011-11-30 DIAGNOSIS — I10 Essential (primary) hypertension: Secondary | ICD-10-CM | POA: Insufficient documentation

## 2011-11-30 DIAGNOSIS — J449 Chronic obstructive pulmonary disease, unspecified: Secondary | ICD-10-CM | POA: Insufficient documentation

## 2011-11-30 DIAGNOSIS — I83009 Varicose veins of unspecified lower extremity with ulcer of unspecified site: Secondary | ICD-10-CM | POA: Insufficient documentation

## 2011-11-30 DIAGNOSIS — Z79899 Other long term (current) drug therapy: Secondary | ICD-10-CM | POA: Insufficient documentation

## 2011-11-30 DIAGNOSIS — I509 Heart failure, unspecified: Secondary | ICD-10-CM | POA: Insufficient documentation

## 2011-12-03 ENCOUNTER — Ambulatory Visit (INDEPENDENT_AMBULATORY_CARE_PROVIDER_SITE_OTHER): Payer: Medicare Other | Admitting: Cardiology

## 2011-12-03 ENCOUNTER — Encounter: Payer: Self-pay | Admitting: Cardiology

## 2011-12-03 VITALS — BP 132/88 | HR 59 | Ht 60.0 in | Wt 236.0 lb

## 2011-12-03 DIAGNOSIS — R609 Edema, unspecified: Secondary | ICD-10-CM

## 2011-12-03 DIAGNOSIS — E785 Hyperlipidemia, unspecified: Secondary | ICD-10-CM

## 2011-12-03 DIAGNOSIS — I509 Heart failure, unspecified: Secondary | ICD-10-CM

## 2011-12-03 DIAGNOSIS — R0609 Other forms of dyspnea: Secondary | ICD-10-CM

## 2011-12-03 DIAGNOSIS — I10 Essential (primary) hypertension: Secondary | ICD-10-CM

## 2011-12-03 DIAGNOSIS — R002 Palpitations: Secondary | ICD-10-CM

## 2011-12-03 DIAGNOSIS — R0600 Dyspnea, unspecified: Secondary | ICD-10-CM | POA: Insufficient documentation

## 2011-12-03 LAB — BASIC METABOLIC PANEL
CO2: 33 mEq/L — ABNORMAL HIGH (ref 19–32)
Calcium: 9.4 mg/dL (ref 8.4–10.5)
GFR: 46.57 mL/min — ABNORMAL LOW (ref >60.00)
Glucose, Bld: 88 mg/dL (ref 70–99)
Potassium: 4.1 mEq/L (ref 3.5–5.1)
Sodium: 143 mEq/L (ref 135–145)

## 2011-12-03 NOTE — Patient Instructions (Signed)
Your physician wants you to follow-up in: [place] will receive a reminder letter in the mail two months in advance. If you don't receive a letter, please call our office to schedule the follow-up appointment.   Your physician has requested that you have an echocardiogram. Echocardiography is a painless test that uses sound waves to create images of your heart. It provides your doctor with information about the size and shape of your heart and how well your heart's chambers and valves are working. This procedure takes approximately one hour. There are no restrictions for this procedure.   Your physician recommends that you HAVE LAB WORK TODAY

## 2011-12-03 NOTE — Assessment & Plan Note (Signed)
Management per primary care. 

## 2011-12-03 NOTE — Progress Notes (Signed)
HPI: 71 yo female for eval of CHF/lower ext edema. Patient saw Dr. Percival Spanish in September of 2011 for this issue. Echocardiogram in 2005 apparently showed normal LV function. Myoview in September of 2011 showed normal perfusion and an ejection fraction of 83%. Carotid Dopplers in September of 2011 showed 0-39% bilateral stenosis and followup recommended in 2 years. Venous Dopplers in March of 2013 showed no DVT. Right calf veins not well visualized. ABIs in March of 2013 were normal. BNP in March of 2013 mildly elevated at 221. BUN and creatinine 22/1.0. Hemoglobin normal. LFTs normal in February 2013. Patient has chronic dyspnea on exertion which may be worse in the past 2 months. There is no orthopnea or PND. No productive cough or hemoptysis. She has chronic pedal edema which was also worse. She was recently restarted on Lasix with significant improvement. She has had an occasional pain in various locations on her chest that lasted approximately 1 minute but no exertional chest pain.  Current Outpatient Prescriptions  Medication Sig Dispense Refill  . albuterol (PROAIR HFA) 108 (90 BASE) MCG/ACT inhaler Inhale 2 puffs into the lungs 2 (two) times daily.        Marland Kitchen allopurinol (ZYLOPRIM) 100 MG tablet TAKE 1 TABLET BY MOUTH DAILY FOR GOUT  30 tablet  5  . aspirin 81 MG EC tablet Take 81 mg by mouth daily.        . cephALEXin (KEFLEX) 500 MG capsule as directed.      . Fluticasone-Salmeterol (ADVAIR DISKUS) 250-50 MCG/DOSE AEPB Inhale 1 puff into the lungs 2 (two) times daily.  1 each  0  . furosemide (LASIX) 40 MG tablet Take 1 tablet (40 mg total) by mouth 2 (two) times daily.  180 tablet  3  . losartan (COZAAR) 100 MG tablet Take 1 tablet (100 mg total) by mouth daily.  90 tablet  3  . nebivolol (BYSTOLIC) 5 MG tablet Take 1 tablet (5 mg total) by mouth daily.  90 tablet  1  . Probiotic Product (ALIGN) 4 MG CAPS Take by mouth daily.        Marland Kitchen tiotropium (SPIRIVA) 18 MCG inhalation capsule Place 18 mcg  into inhaler and inhale daily.        Marland Kitchen DISCONTD: olmesartan-hydrochlorothiazide (BENICAR HCT) 40-25 MG per tablet Take 1 tablet by mouth daily.  90 tablet  3    Allergies  Allergen Reactions  . Celebrex (Celecoxib)     edema  . Enalapril Cough  . Amlodipine Besylate     REACTION: edema  . Amoxicillin     REACTION: Diarrhea  . Codeine Sulfate     REACTION: Nausea  . Hydrocodone-Acetaminophen     REACTION: Nausea    Past Medical History  Diagnosis Date  . HTN (hypertension) 07/30/1988  . GERD (gastroesophageal reflux disease) 07/30/92  . COPD (chronic obstructive pulmonary disease) 01/27/98  . Hyperlipidemia 11/27/96  . CHF (congestive heart failure) 08/31/03  . Gout   . Renal insufficiency   . Kidney stone 1960, 1972, 1991  . Ulcerative colitis     No past surgical history on file.  History   Social History  . Marital Status: Married    Spouse Name: N/A    Number of Children: 3  . Years of Education: N/A   Occupational History  .  [employer]   Social History Main Topics  . Smoking status: Former Research scientist (life sciences)  . Smokeless tobacco: Not on file   Comment: Quit 1998  . Alcohol Use: No  .  Drug Use: No  . Sexually Active: Not Currently   Other Topics Concern  . Not on file   Social History Narrative   Daily Caffeine Use:  4Regular Exercise -  NO    Family History  Problem Relation Age of Onset  . Stroke Mother   . Diabetes Mother   . Kidney cancer Mother   . Stomach cancer Mother   . Heart disease Mother   . Stroke Father   . Heart disease Sister     CATH,STENT  . Clotting disorder Sister   . Cervical cancer Sister   . Heart disease Brother   . Colon cancer Neg Hx   . Heart disease Brother   . Crohn's disease Brother   . Lung cancer Sister   . Heart attack Sister   . Diabetes Sister     ROS:  no fevers or chills, productive cough, hemoptysis, dysphasia, odynophagia, melena, hematochezia, dysuria, hematuria, rash, seizure activity, orthopnea, PND, claudication.  Remaining systems are negative.  Physical Exam:   Blood pressure 132/88, pulse 59, height 5' (1.524 m), weight 107.049 kg (236 lb).  General:  Well developed/obese in NAD Skin warm/dry Patient not depressed No peripheral clubbing Back-normal HEENT-normal/normal eyelids Neck supple/normal carotid upstroke bilaterally; no bruits; no JVD; no thyromegaly chest - CTA/ normal expansion CV - RRR/normal S1 and S2; no rubs or gallops;  PMI nondisplaced, 1/6 systolic murmur LSB Abdomen -NT/ND, no HSM, no mass, + bowel sounds, no bruit; umbilical hernia Ext-chronic skin changes and trace to 1+ edema Neuro-grossly nonfocal  ECG sinus bradycardia at a rate of 59. Left ventricular hypertrophy with repolarization abnormality.

## 2011-12-03 NOTE — Assessment & Plan Note (Signed)
Patient has chronic lower extremity edema. This is most likely related to venous insufficiency. This has improved with Lasix. Check potassium and renal function. Repeat echocardiogram to assess LV and RV function. There may be a component of diastolic dysfunction or right side CHF from obesity hypoventilation syndrome.

## 2011-12-03 NOTE — Assessment & Plan Note (Signed)
Blood pressure controlled. Continue present medications. 

## 2011-12-03 NOTE — Assessment & Plan Note (Addendum)
Most likely multifactorial. There may be a component of diastolic congestive heart failure. However there is also a component of obesity hypoventilation syndrome and deconditioning. She also has a history of asthma and chronic bronchitis.  Approximately 20 minutes spent reviewing previous records and Dr Hochrein's previous note.

## 2011-12-03 NOTE — Assessment & Plan Note (Signed)
Possible contribution from diastolic congestive heart failure. Repeat echocardiogram. Continue present dose of Lasix.

## 2011-12-10 NOTE — Progress Notes (Signed)
Addended by: Concepcion Living D on: 12/10/2011 04:36 PM   Modules accepted: Orders

## 2011-12-12 ENCOUNTER — Ambulatory Visit (HOSPITAL_COMMUNITY): Payer: Medicare Other | Attending: Cardiology

## 2011-12-12 ENCOUNTER — Other Ambulatory Visit: Payer: Self-pay

## 2011-12-12 DIAGNOSIS — J4489 Other specified chronic obstructive pulmonary disease: Secondary | ICD-10-CM | POA: Insufficient documentation

## 2011-12-12 DIAGNOSIS — I509 Heart failure, unspecified: Secondary | ICD-10-CM | POA: Insufficient documentation

## 2011-12-12 DIAGNOSIS — E785 Hyperlipidemia, unspecified: Secondary | ICD-10-CM | POA: Insufficient documentation

## 2011-12-12 DIAGNOSIS — R0609 Other forms of dyspnea: Secondary | ICD-10-CM | POA: Insufficient documentation

## 2011-12-12 DIAGNOSIS — N289 Disorder of kidney and ureter, unspecified: Secondary | ICD-10-CM | POA: Insufficient documentation

## 2011-12-12 DIAGNOSIS — I059 Rheumatic mitral valve disease, unspecified: Secondary | ICD-10-CM

## 2011-12-12 DIAGNOSIS — R0989 Other specified symptoms and signs involving the circulatory and respiratory systems: Secondary | ICD-10-CM | POA: Insufficient documentation

## 2011-12-12 DIAGNOSIS — I10 Essential (primary) hypertension: Secondary | ICD-10-CM | POA: Insufficient documentation

## 2011-12-12 DIAGNOSIS — J449 Chronic obstructive pulmonary disease, unspecified: Secondary | ICD-10-CM | POA: Insufficient documentation

## 2011-12-12 DIAGNOSIS — R609 Edema, unspecified: Secondary | ICD-10-CM

## 2011-12-12 DIAGNOSIS — R002 Palpitations: Secondary | ICD-10-CM | POA: Insufficient documentation

## 2011-12-13 ENCOUNTER — Telehealth: Payer: Self-pay | Admitting: Cardiology

## 2011-12-13 NOTE — Telephone Encounter (Signed)
Fu msg Pt returning your call

## 2011-12-17 NOTE — Telephone Encounter (Signed)
Pt aware of echo results

## 2012-01-01 ENCOUNTER — Encounter (HOSPITAL_BASED_OUTPATIENT_CLINIC_OR_DEPARTMENT_OTHER): Payer: Medicare Other | Attending: General Surgery

## 2012-01-01 DIAGNOSIS — L97809 Non-pressure chronic ulcer of other part of unspecified lower leg with unspecified severity: Secondary | ICD-10-CM | POA: Insufficient documentation

## 2012-01-01 DIAGNOSIS — J4489 Other specified chronic obstructive pulmonary disease: Secondary | ICD-10-CM | POA: Insufficient documentation

## 2012-01-01 DIAGNOSIS — J449 Chronic obstructive pulmonary disease, unspecified: Secondary | ICD-10-CM | POA: Insufficient documentation

## 2012-01-01 DIAGNOSIS — I509 Heart failure, unspecified: Secondary | ICD-10-CM | POA: Insufficient documentation

## 2012-01-01 DIAGNOSIS — I872 Venous insufficiency (chronic) (peripheral): Secondary | ICD-10-CM | POA: Insufficient documentation

## 2012-01-01 DIAGNOSIS — I10 Essential (primary) hypertension: Secondary | ICD-10-CM | POA: Insufficient documentation

## 2012-01-01 DIAGNOSIS — I739 Peripheral vascular disease, unspecified: Secondary | ICD-10-CM | POA: Insufficient documentation

## 2012-01-01 DIAGNOSIS — Z79899 Other long term (current) drug therapy: Secondary | ICD-10-CM | POA: Insufficient documentation

## 2012-01-04 ENCOUNTER — Encounter (HOSPITAL_BASED_OUTPATIENT_CLINIC_OR_DEPARTMENT_OTHER): Payer: Medicare Other

## 2012-01-17 ENCOUNTER — Encounter: Payer: Self-pay | Admitting: Internal Medicine

## 2012-01-17 ENCOUNTER — Ambulatory Visit (INDEPENDENT_AMBULATORY_CARE_PROVIDER_SITE_OTHER): Payer: Medicare Other | Admitting: Internal Medicine

## 2012-01-17 VITALS — BP 142/64 | HR 56 | Temp 97.8°F | Resp 16 | Wt 236.5 lb

## 2012-01-17 DIAGNOSIS — I10 Essential (primary) hypertension: Secondary | ICD-10-CM

## 2012-01-17 DIAGNOSIS — M25561 Pain in right knee: Secondary | ICD-10-CM

## 2012-01-17 DIAGNOSIS — M25569 Pain in unspecified knee: Secondary | ICD-10-CM

## 2012-01-17 MED ORDER — METHYLPREDNISOLONE ACETATE 40 MG/ML IJ SUSP: 40.0000 mg | Freq: Once | INTRAMUSCULAR | Status: DC

## 2012-01-17 MED ORDER — TRAMADOL HCL 50 MG PO TABS: 50.0000 mg | ORAL_TABLET | Freq: Three times a day (TID) | ORAL | Status: AC | PRN

## 2012-01-17 NOTE — Addendum Note (Signed)
Addended by: [name] on: 01/17/2012 04:10 PM   Modules accepted: Orders

## 2012-01-17 NOTE — Assessment & Plan Note (Signed)
Her BP is well controlled today

## 2012-01-17 NOTE — Assessment & Plan Note (Signed)
Pain relief today with joint injection, stop celebrex and start tramadol for pain, ortho referral (? Need for MRI)

## 2012-01-17 NOTE — Progress Notes (Signed)
Subjective:    Patient ID: [name], female    DOB: 06-21-1941, 71 y.o.   MRN: [id number]  Arthritis Presents for follow-up visit. She complains of pain and stiffness. She reports no joint swelling or joint warmth. The symptoms have been worsening. Affected locations include the right knee. Her pain is at a severity of 3/10. Pertinent negatives include no diarrhea, fatigue, fever, pain at night, pain while resting, rash or weight loss. Compliance with total regimen is 26-50%. Compliance problems include medication side effects (celebrex causes edema).  Compliance with medications is 0-25%.      Review of Systems  Constitutional: Negative for fever, chills, weight loss, diaphoresis, activity change, appetite change, fatigue and unexpected weight change.  HENT: Negative.   Eyes: Negative.   Respiratory: Negative for cough, chest tightness, shortness of breath, wheezing and stridor.   Cardiovascular: Negative for chest pain, palpitations and leg swelling.  Gastrointestinal: Negative.  Negative for diarrhea.  Genitourinary: Negative.   Musculoskeletal: Positive for arthralgias, arthritis and stiffness. Negative for myalgias, back pain, joint swelling and gait problem.  Skin: Negative.  Negative for rash.  Neurological: Negative.   Hematological: Negative for adenopathy. Does not bruise/bleed easily.  Psychiatric/Behavioral: Negative.        Objective:   Physical Exam  Vitals reviewed. Constitutional: She appears well-developed and well-nourished. No distress.  HENT:  Head: Atraumatic.  Mouth/Throat: Oropharynx is clear and moist. No oropharyngeal exudate.  Eyes: Conjunctivae are normal. Right eye exhibits no discharge. Left eye exhibits no discharge. No scleral icterus.  Neck: Normal range of motion. Neck supple. No JVD present. No tracheal deviation present. No thyromegaly present.  Cardiovascular: Normal rate, regular rhythm, normal heart sounds and intact distal pulses.   Exam reveals no gallop and no friction rub.   No murmur heard. Pulmonary/Chest: Effort normal and breath sounds normal. No stridor. No respiratory distress. She has no wheezes. She has no rales. She exhibits no tenderness.  Abdominal: Soft. Bowel sounds are normal. She exhibits no distension and no mass. There is no tenderness. There is no rebound and no guarding.  Musculoskeletal:       Right knee: She exhibits deformity (chronic DJD changes, no effusion). She exhibits normal range of motion, no swelling, no effusion, no ecchymosis, no laceration, no erythema, normal alignment, no LCL laxity, normal patellar mobility, no bony tenderness, normal meniscus and no MCL laxity. tenderness found. Medial joint line tenderness noted. No lateral joint line, no MCL, no LCL and no patellar tendon tenderness noted.       Lateral approach to Rt knee, cleaned with betadine, sterile drape, local anesthesia with 2% plain lido then 25 gauge 1.5 in needle into the joint space to inject 1/2 cc plain lido 2% and 1/2 cc depo-medrol (40 mg), easily injected, no resistance or complications, no blood loss  Lymphadenopathy:    She has no cervical adenopathy.  Skin: She is not diaphoretic.      Lab Results  Component Value Date   WBC 10.3 10/10/2011   HGB 12.6 10/10/2011   HCT 38.7 10/10/2011   PLT 199.0 10/10/2011   GLUCOSE 88 12/03/2011   CHOL 183 04/11/2011   TRIG 126.0 04/11/2011   HDL 46.80 04/11/2011   LDLDIRECT 135.3 11/20/2010   LDLCALC 111* 04/11/2011   ALT 14 09/25/2011   AST 19 09/25/2011   NA 143 12/03/2011   K 4.1 12/03/2011   CL 101 12/03/2011   CREATININE 1.2 12/03/2011   BUN 29* 12/03/2011   CO2  33* 12/03/2011   TSH 1.79 04/11/2011   HGBA1C 6.2 09/25/2011   MICROALBUR 0.5 11/17/2008    Dg Knee Complete 4 Views Right  09/25/2011  *RADIOLOGY REPORT*  Clinical Data: Knee pain, no injury  RIGHT KNEE - COMPLETE 4+ VIEW  Comparison:  None.  Findings:  There is no evidence of fracture, dislocation, or joint effusion.   There is no evidence of arthropathy or other focal bone abnormality.  Soft tissues are unremarkable.  IMPRESSION: Negative.  Original Report Authenticated By: Truett Perna, M.D.  Assessment & Plan:

## 2012-01-17 NOTE — Patient Instructions (Signed)
Knee Pain The knee is the complex joint between your thigh and your lower leg. It is made up of bones, tendons, ligaments, and cartilage. The bones that make up the knee are:  The femur in the thigh.   The tibia and fibula in the lower leg.   The patella or kneecap riding in the groove on the lower femur.  CAUSES  Knee pain is a common complaint with many causes. A few of these causes are:  Injury, such as:   A ruptured ligament or tendon injury.   Torn cartilage.   Medical conditions, such as:   Gout   Arthritis   Infections   Overuse, over training or overdoing a physical activity.  Knee pain can be minor or severe. Knee pain can accompany debilitating injury. Minor knee problems often respond well to self-care measures or get well on their own. More serious injuries may need medical intervention or even surgery. SYMPTOMS The knee is complex. Symptoms of knee problems can vary widely. Some of the problems are:  Pain with movement and weight bearing.   Swelling and tenderness.   Buckling of the knee.   Inability to straighten or extend your knee.   Your knee locks and you cannot straighten it.   Warmth and redness with pain and fever.   Deformity or dislocation of the kneecap.  DIAGNOSIS  Determining what is wrong may be very straight forward such as when there is an injury. It can also be challenging because of the complexity of the knee. Tests to make a diagnosis may include:  Your caregiver taking a history and doing a physical exam.   Routine X-rays can be used to rule out other problems. X-rays will not reveal a cartilage tear. Some injuries of the knee can be diagnosed by:   Arthroscopy a surgical technique by which a small video camera is inserted through tiny incisions on the sides of the knee. This procedure is used to examine and repair internal knee joint problems. Tiny instruments can be used during arthroscopy to repair the torn knee cartilage  (meniscus).   Arthrography is a radiology technique. A contrast liquid is directly injected into the knee joint. Internal structures of the knee joint then become visible on X-ray film.   An MRI scan is a non x-ray radiology procedure in which magnetic fields and a computer produce two- or three-dimensional images of the inside of the knee. Cartilage tears are often visible using an MRI scanner. MRI scans have largely replaced arthrography in diagnosing cartilage tears of the knee.   Blood work.   Examination of the fluid that helps to lubricate the knee joint (synovial fluid). This is done by taking a sample out using a needle and a syringe.  TREATMENT The treatment of knee problems depends on the cause. Some of these treatments are:  Depending on the injury, proper casting, splinting, surgery or physical therapy care will be needed.   Give yourself adequate recovery time. Do not overuse your joints. If you begin to get sore during workout routines, back off. Slow down or do fewer repetitions.   For repetitive activities such as cycling or running, maintain your strength and nutrition.   Alternate muscle groups. For example if you are a weight lifter, work the upper body on one day and the lower body the next.   Either tight or weak muscles do not give the proper support for your knee. Tight or weak muscles do not absorb the stress placed  on the knee joint. Keep the muscles surrounding the knee strong.   Take care of mechanical problems.   If you have flat feet, orthotics or special shoes may help. See your caregiver if you need help.   Arch supports, sometimes with wedges on the inner or outer aspect of the heel, can help. These can shift pressure away from the side of the knee most bothered by osteoarthritis.   A brace called an "unloader" brace also may be used to help ease the pressure on the most arthritic side of the knee.   If your caregiver has prescribed crutches, braces,  wraps or ice, use as directed. The acronym for this is PRICE. This means protection, rest, ice, compression and elevation.   Nonsteroidal anti-inflammatory drugs (NSAID's), can help relieve pain. But if taken immediately after an injury, they may actually increase swelling. Take NSAID's with food in your stomach. Stop them if you develop stomach problems. Do not take these if you have a history of ulcers, stomach pain or bleeding from the bowel. Do not take without your caregiver's approval if you have problems with fluid retention, heart failure, or kidney problems.   For ongoing knee problems, physical therapy may be helpful.   Glucosamine and chondroitin are over-the-counter dietary supplements. Both may help relieve the pain of osteoarthritis in the knee. These medicines are different from the usual anti-inflammatory drugs. Glucosamine may decrease the rate of cartilage destruction.   Injections of a corticosteroid drug into your knee joint may help reduce the symptoms of an arthritis flare-up. They may provide pain relief that lasts a few months. You may have to wait a few months between injections. The injections do have a small increased risk of infection, water retention and elevated blood sugar levels.   Hyaluronic acid injected into damaged joints may ease pain and provide lubrication. These injections may work by reducing inflammation. A series of shots may give relief for as long as 6 months.   Topical painkillers. Applying certain ointments to your skin may help relieve the pain and stiffness of osteoarthritis. Ask your pharmacist for suggestions. Many over the-counter products are approved for temporary relief of arthritis pain.   In some countries, doctors often prescribe topical NSAID's for relief of chronic conditions such as arthritis and tendinitis. A review of treatment with NSAID creams found that they worked as well as oral medications but without the serious side effects.    PREVENTION  Maintain a healthy weight. Extra pounds put more strain on your joints.   Get strong, stay limber. Weak muscles are a common cause of knee injuries. Stretching is important. Include flexibility exercises in your workouts.   Be smart about exercise. If you have osteoarthritis, chronic knee pain or recurring injuries, you may need to change the way you exercise. This does not mean you have to stop being active. If your knees ache after jogging or playing basketball, consider switching to swimming, water aerobics or other low-impact activities, at least for a few days a week. Sometimes limiting high-impact activities will provide relief.   Make sure your shoes fit well. Choose footwear that is right for your sport.   Protect your knees. Use the proper gear for knee-sensitive activities. Use kneepads when playing volleyball or laying carpet. Buckle your seat belt every time you drive. Most shattered kneecaps occur in car accidents.   Rest when you are tired.  SEEK MEDICAL CARE IF:  You have knee pain that is continual and does not  seem to be getting better.  SEEK IMMEDIATE MEDICAL CARE IF:  Your knee joint feels hot to the touch and you have a high fever. MAKE SURE YOU:   Understand these instructions.   Will watch your condition.   Will get help right away if you are not doing well or get worse.  Document Released: 05/13/2007 Document Revised: 07/05/2011 Document Reviewed: 05/13/2007 [facility] Patient Information 2012 [place].

## 2012-01-29 ENCOUNTER — Encounter (HOSPITAL_BASED_OUTPATIENT_CLINIC_OR_DEPARTMENT_OTHER): Payer: Medicare Other | Attending: General Surgery

## 2012-01-29 DIAGNOSIS — L97809 Non-pressure chronic ulcer of other part of unspecified lower leg with unspecified severity: Secondary | ICD-10-CM | POA: Insufficient documentation

## 2012-01-29 DIAGNOSIS — I872 Venous insufficiency (chronic) (peripheral): Secondary | ICD-10-CM | POA: Insufficient documentation

## 2012-03-04 ENCOUNTER — Encounter (HOSPITAL_BASED_OUTPATIENT_CLINIC_OR_DEPARTMENT_OTHER): Payer: Medicare Other

## 2012-03-04 ENCOUNTER — Encounter (HOSPITAL_BASED_OUTPATIENT_CLINIC_OR_DEPARTMENT_OTHER): Payer: Medicare Other | Attending: General Surgery

## 2012-03-04 DIAGNOSIS — L97809 Non-pressure chronic ulcer of other part of unspecified lower leg with unspecified severity: Secondary | ICD-10-CM | POA: Insufficient documentation

## 2012-03-04 DIAGNOSIS — I872 Venous insufficiency (chronic) (peripheral): Secondary | ICD-10-CM | POA: Insufficient documentation

## 2012-03-29 ENCOUNTER — Other Ambulatory Visit: Payer: Self-pay | Admitting: Internal Medicine

## 2012-04-01 ENCOUNTER — Encounter (HOSPITAL_BASED_OUTPATIENT_CLINIC_OR_DEPARTMENT_OTHER): Payer: Medicare Other | Attending: General Surgery

## 2012-04-01 DIAGNOSIS — L97809 Non-pressure chronic ulcer of other part of unspecified lower leg with unspecified severity: Secondary | ICD-10-CM | POA: Insufficient documentation

## 2012-04-11 ENCOUNTER — Other Ambulatory Visit: Payer: Self-pay | Admitting: Internal Medicine

## 2012-04-18 ENCOUNTER — Encounter: Payer: Self-pay | Admitting: Gastroenterology

## 2012-04-29 ENCOUNTER — Encounter (HOSPITAL_BASED_OUTPATIENT_CLINIC_OR_DEPARTMENT_OTHER): Payer: Medicare Other | Attending: General Surgery

## 2012-04-29 DIAGNOSIS — L97809 Non-pressure chronic ulcer of other part of unspecified lower leg with unspecified severity: Secondary | ICD-10-CM | POA: Insufficient documentation

## 2012-04-29 DIAGNOSIS — I872 Venous insufficiency (chronic) (peripheral): Secondary | ICD-10-CM | POA: Insufficient documentation

## 2012-06-03 ENCOUNTER — Encounter (HOSPITAL_BASED_OUTPATIENT_CLINIC_OR_DEPARTMENT_OTHER): Payer: Medicare Other | Attending: General Surgery

## 2012-06-03 DIAGNOSIS — L97909 Non-pressure chronic ulcer of unspecified part of unspecified lower leg with unspecified severity: Secondary | ICD-10-CM | POA: Insufficient documentation

## 2012-06-10 NOTE — Discharge Summary (Signed)
[name], [name]               ACCOUNT NO.:  [id number]  MEDICAL RECORD NO.:  [id number]  LOCATION:  FOOT                         FACILITY:  [facility]  PHYSICIAN:  Elesa Hacker, M.D.        DATE OF BIRTH:  21-Aug-1940  DATE OF ADMISSION:  06/03/2012 DATE OF DISCHARGE:  06/10/2012                              DISCHARGE SUMMARY   ADMISSION DIAGNOSIS:  This patient was suffering from chronic venous stasis with an additional traumatic wound.  Upon admission to the clinic, she had a sizable defect.  CLINIC COURSE:  After vascular studies were performed, the patient was treated with various antibiotics, Profore Lite dressings, Endoform, Apligraf was applied.  Unna boots were then applied.  After a very prolonged course, the wound was finally completely healed.  DISCHARGE INSTRUCTIONS:  Elevate when possible, and support stockings and see [place] p.r.n.     Elesa Hacker, M.D.     RA/MEDQ  D:  06/10/2012  T:  06/10/2012  Job:  [id number]

## 2012-06-12 ENCOUNTER — Encounter: Payer: Self-pay | Admitting: Internal Medicine

## 2012-06-12 ENCOUNTER — Ambulatory Visit (INDEPENDENT_AMBULATORY_CARE_PROVIDER_SITE_OTHER)
Admission: RE | Admit: 2012-06-12 | Discharge: 2012-06-12 | Disposition: A | Discharge status: Home / Self Care | Payer: Medicare Other | Source: Ambulatory Visit | Attending: Internal Medicine | Admitting: Internal Medicine

## 2012-06-12 ENCOUNTER — Ambulatory Visit (INDEPENDENT_AMBULATORY_CARE_PROVIDER_SITE_OTHER): Payer: Medicare Other | Admitting: Internal Medicine

## 2012-06-12 VITALS — BP 126/76 | HR 59 | Temp 97.6°F | Ht 60.0 in | Wt 240.0 lb

## 2012-06-12 DIAGNOSIS — R05 Cough: Secondary | ICD-10-CM

## 2012-06-12 DIAGNOSIS — J209 Acute bronchitis, unspecified: Secondary | ICD-10-CM

## 2012-06-12 MED ORDER — ALBUTEROL SULFATE HFA 108 (90 BASE) MCG/ACT IN AERS: 2.0000 | INHALATION_SPRAY | Freq: Two times a day (BID) | RESPIRATORY_TRACT | Status: DC

## 2012-06-12 MED ORDER — AZITHROMYCIN 250 MG PO TABS: ORAL_TABLET | ORAL | Status: DC

## 2012-06-12 MED ORDER — HYDROCODONE-HOMATROPINE 5-1.5 MG/5ML PO SYRP: 2.5000 mL | ORAL_SOLUTION | Freq: Four times a day (QID) | ORAL | Status: DC | PRN

## 2012-06-12 NOTE — Progress Notes (Signed)
HPI  Pt reports to clinic today with c/o of possible bronchitis. She started having a cough 6 days ago. She then developed fevers, chest tightness and some difficulty breathing requiring the use of her inhaler and nebulizer. The cough has become incessant with thick tan sputum production. The cough is as worse during the day as it is at night and she is unable to get any rest. She has been taking Delsym cough syrup and Cepachol cough drops without any relief. When she exerts herself, she has increased work of breathing. She has body aches but denies chills, nausea and vomiting. She does have a history of COPD, is still currently smoking, and does use a rescue inhaler.  Review of Systems    Past Medical History  Diagnosis Date  . HTN (hypertension) 07/30/1988  . GERD (gastroesophageal reflux disease) 07/30/92  . COPD (chronic obstructive pulmonary disease) 01/27/98  . Hyperlipidemia 11/27/96  . CHF (congestive heart failure) 08/31/03  . Gout   . Renal insufficiency   . Kidney stone 1960, 1972, 1991  . Ulcerative colitis     Family History  Problem Relation Age of Onset  . Stroke Mother   . Diabetes Mother   . Kidney cancer Mother   . Stomach cancer Mother   . Heart disease Mother   . Stroke Father   . Heart disease Sister     CATH,STENT  . Clotting disorder Sister   . Cervical cancer Sister   . Heart disease Brother   . Colon cancer Neg Hx   . Heart disease Brother   . Crohn's disease Brother   . Lung cancer Sister   . Heart attack Sister   . Diabetes Sister     History   Social History  . Marital Status: Married    Spouse Name: N/A    Number of Children: 3  . Years of Education: N/A   Occupational History  .  [employer]   Social History Main Topics  . Smoking status: Former Research scientist (life sciences)  . Smokeless tobacco: Not on file     Comment: Quit 1998  . Alcohol Use: No  . Drug Use: No  . Sexually Active: Not Currently   Other Topics Concern  . Not on file   Social History Narrative     Daily Caffeine Use:  4Regular Exercise -  NO    Allergies  Allergen Reactions  . Celebrex (Celecoxib)     edema  . Enalapril Cough  . Amlodipine Besylate     REACTION: edema  . Amoxicillin     REACTION: Diarrhea  . Codeine Sulfate     REACTION: Nausea  . Hydrocodone-Acetaminophen     REACTION: Nausea     Constitutional: Pt reports fever. Denies headache, fatigue or abrupt weight changes.  HEENT: Denies eye pain, pressure behind the eyes, facial pain, nasal congestion and sore throat, eye redness, ear pain, ringing in the ears, wax buildup, runny nose or bloody nose. Respiratory: Positive cough and thick green sputum production. Denies difficulty breathing or shortness of breath.  Cardiovascular: Pt reports chest tightness. Denies chest pain, palpitations or swelling in the hands or feet.   No other specific complaints in a complete review of systems (except as listed in HPI above).  Objective:    General: Appears her stated age, well developed, well nourished in NAD. HEENT: Head: normal shape and size; Eyes: sclera white, no icterus, conjunctiva pink, PERRLA and EOMs intact; Ears: Tm's gray and intact, normal light reflex; Nose: mucosa  pink and moist, septum midline; Throat/Mouth: + PND. Teeth present, mucosa pink and moist, no exudate noted, no lesions or ulcerations noted.  Neck: Mild cervical lymphadenopathy. Neck supple, trachea midline. No massses, lumps or thyromegaly present.  Cardiovascular: Normal rate and rhythm. S1,S2 noted.  No murmur, rubs or gallops noted. No JVD or BLE edema. No carotid bruits noted. Pulmonary/Chest: Normal effort with bilateral expiratory wheezing. No respiratory distress. No rales or ronchi noted.      Assessment & Plan:   Acute bronchitis  Stay well hydrated and get plenty of rest. May take OTC Mucinex as needed. Azithromax 250 mg x 5 days Hycodan cough syrup for bedtime Chest xray to r/o pneumonia  RTC as needed or if symptoms  persist.

## 2012-06-12 NOTE — Patient Instructions (Signed)

## 2012-06-29 ENCOUNTER — Other Ambulatory Visit: Payer: Self-pay | Admitting: Internal Medicine

## 2012-07-01 ENCOUNTER — Encounter (HOSPITAL_BASED_OUTPATIENT_CLINIC_OR_DEPARTMENT_OTHER): Payer: Medicare Other

## 2012-09-23 ENCOUNTER — Other Ambulatory Visit: Payer: Self-pay | Admitting: Internal Medicine

## 2012-10-01 ENCOUNTER — Other Ambulatory Visit: Payer: Self-pay | Admitting: Internal Medicine

## 2013-01-08 ENCOUNTER — Encounter: Payer: Self-pay | Admitting: Cardiology

## 2013-02-05 ENCOUNTER — Ambulatory Visit (INDEPENDENT_AMBULATORY_CARE_PROVIDER_SITE_OTHER): Payer: Medicare Other | Admitting: Internal Medicine

## 2013-02-05 ENCOUNTER — Other Ambulatory Visit (INDEPENDENT_AMBULATORY_CARE_PROVIDER_SITE_OTHER): Payer: Medicare Other

## 2013-02-05 ENCOUNTER — Encounter: Payer: Self-pay | Admitting: Internal Medicine

## 2013-02-05 VITALS — BP 128/80 | HR 16 | Temp 97.5°F | Resp 66 | Ht 60.0 in | Wt 239.0 lb

## 2013-02-05 DIAGNOSIS — M8580 Other specified disorders of bone density and structure, unspecified site: Secondary | ICD-10-CM

## 2013-02-05 DIAGNOSIS — E1165 Type 2 diabetes mellitus with hyperglycemia: Secondary | ICD-10-CM

## 2013-02-05 DIAGNOSIS — M109 Gout, unspecified: Secondary | ICD-10-CM

## 2013-02-05 DIAGNOSIS — Z23 Encounter for immunization: Secondary | ICD-10-CM

## 2013-02-05 DIAGNOSIS — N259 Disorder resulting from impaired renal tubular function, unspecified: Secondary | ICD-10-CM

## 2013-02-05 DIAGNOSIS — E1129 Type 2 diabetes mellitus with other diabetic kidney complication: Secondary | ICD-10-CM

## 2013-02-05 DIAGNOSIS — E538 Deficiency of other specified B group vitamins: Secondary | ICD-10-CM

## 2013-02-05 DIAGNOSIS — E559 Vitamin D deficiency, unspecified: Secondary | ICD-10-CM

## 2013-02-05 DIAGNOSIS — M899 Disorder of bone, unspecified: Secondary | ICD-10-CM

## 2013-02-05 DIAGNOSIS — L02419 Cutaneous abscess of limb, unspecified: Secondary | ICD-10-CM

## 2013-02-05 DIAGNOSIS — L97909 Non-pressure chronic ulcer of unspecified part of unspecified lower leg with unspecified severity: Secondary | ICD-10-CM

## 2013-02-05 DIAGNOSIS — L03115 Cellulitis of right lower limb: Secondary | ICD-10-CM | POA: Insufficient documentation

## 2013-02-05 DIAGNOSIS — E785 Hyperlipidemia, unspecified: Secondary | ICD-10-CM

## 2013-02-05 DIAGNOSIS — Z1231 Encounter for screening mammogram for malignant neoplasm of breast: Secondary | ICD-10-CM

## 2013-02-05 DIAGNOSIS — L97919 Non-pressure chronic ulcer of unspecified part of right lower leg with unspecified severity: Secondary | ICD-10-CM

## 2013-02-05 DIAGNOSIS — L03119 Cellulitis of unspecified part of limb: Secondary | ICD-10-CM

## 2013-02-05 DIAGNOSIS — I10 Essential (primary) hypertension: Secondary | ICD-10-CM

## 2013-02-05 HISTORY — DX: Other specified disorders of bone density and structure, unspecified site: M85.80

## 2013-02-05 LAB — HEMOGLOBIN A1C: Hgb A1c MFr Bld: 6.4 % (ref 4.6–6.5)

## 2013-02-05 LAB — URINALYSIS, ROUTINE W REFLEX MICROSCOPIC
Bilirubin Urine: NEGATIVE
Ketones, ur: NEGATIVE
Nitrite: NEGATIVE
Specific Gravity, Urine: 1.015 (ref 1.000–1.030)
pH: 5.5 (ref 5.0–8.0)

## 2013-02-05 LAB — TSH: TSH: 3.3 u[IU]/mL (ref 0.35–5.50)

## 2013-02-05 LAB — COMPREHENSIVE METABOLIC PANEL
ALT: 16 U/L (ref 0–35)
AST: 21 U/L (ref 0–37)
Alkaline Phosphatase: 79 U/L (ref 39–117)
Creatinine, Ser: 1.2 mg/dL (ref 0.4–1.2)
Total Bilirubin: 0.6 mg/dL (ref 0.3–1.2)

## 2013-02-05 LAB — CBC WITH DIFFERENTIAL/PLATELET
Basophils Absolute: 0 10*3/uL (ref 0.0–0.1)
Eosinophils Absolute: 0.3 10*3/uL (ref 0.0–0.7)
HCT: 41.4 % (ref 36.0–46.0)
Hemoglobin: 13.6 g/dL (ref 12.0–15.0)
Lymphs Abs: 2.5 10*3/uL (ref 0.7–4.0)
MCHC: 32.9 g/dL (ref 30.0–36.0)
Neutro Abs: 11.3 10*3/uL — ABNORMAL HIGH (ref 1.4–7.7)
RDW: 15.1 % — ABNORMAL HIGH (ref 11.5–14.6)

## 2013-02-05 LAB — MICROALBUMIN / CREATININE URINE RATIO: Microalb Creat Ratio: 34.8 mg/g — ABNORMAL HIGH (ref 0.0–30.0)

## 2013-02-05 LAB — LIPID PANEL
LDL Cholesterol: 108 mg/dL — ABNORMAL HIGH (ref 0–99)
VLDL: 31.2 mg/dL (ref 0.0–40.0)

## 2013-02-05 MED ORDER — CIPROFLOXACIN HCL 500 MG PO TABS: 500.0000 mg | ORAL_TABLET | Freq: Two times a day (BID) | ORAL | Status: AC

## 2013-02-05 MED ORDER — VASCULERA PO TABS: 1.0000 | ORAL_TABLET | Freq: Every day | ORAL | Status: DC

## 2013-02-05 MED ORDER — CIPROFLOXACIN HCL 500 MG PO TABS: 500.0000 mg | ORAL_TABLET | Freq: Two times a day (BID) | ORAL | Status: DC

## 2013-02-05 NOTE — Assessment & Plan Note (Signed)
CBC and B12 level today

## 2013-02-05 NOTE — Assessment & Plan Note (Signed)
I will recheck her renal function

## 2013-02-05 NOTE — Assessment & Plan Note (Signed)
Her BP is well controlled 

## 2013-02-05 NOTE — Assessment & Plan Note (Signed)
I think vasculera will help this heal and prevent complications I have asked her to f/up with the wound clinic to see if an [facility] boot would help this

## 2013-02-05 NOTE — Assessment & Plan Note (Signed)
Will treat with cipro

## 2013-02-05 NOTE — Assessment & Plan Note (Signed)
Check the Vit D level today Have asked her to get a DEXA scan done

## 2013-02-05 NOTE — Assessment & Plan Note (Signed)
I will check her A1C today and will treat if needed

## 2013-02-05 NOTE — Assessment & Plan Note (Signed)
FLP CMP TSH today

## 2013-02-05 NOTE — Assessment & Plan Note (Signed)
Uric acid level today

## 2013-02-05 NOTE — Progress Notes (Signed)
Subjective:    Patient ID: [name], female    DOB: Dec 11, 1940, 72 y.o.   MRN: [id number]  Wound Check She was originally treated more than 14 days ago. Previous treatment included wound cleansing or irrigation. Her temperature was unmeasured prior to arrival. There has been colored discharge from the wound. There is new redness present. There is new swelling present. The pain has new pain. She has no difficulty moving the affected extremity or digit.      Review of Systems  Constitutional: Negative for fever, chills, diaphoresis, activity change, appetite change, fatigue and unexpected weight change.  HENT: Negative.   Eyes: Negative.   Respiratory: Negative.  Negative for cough, chest tightness, shortness of breath, wheezing and stridor.   Cardiovascular: Positive for leg swelling. Negative for chest pain and palpitations.  Gastrointestinal: Negative for nausea, vomiting, abdominal pain, diarrhea and constipation.  Endocrine: Negative.   Genitourinary: Negative.   Musculoskeletal: Negative.  Negative for myalgias, back pain, joint swelling, arthralgias and gait problem.  Skin: Positive for color change and wound. Negative for pallor and rash.  Allergic/Immunologic: Negative.   Neurological: Negative.  Negative for dizziness, tremors, weakness, light-headedness and numbness.  Hematological: Negative.  Negative for adenopathy. Does not bruise/bleed easily.  Psychiatric/Behavioral: Negative.        Objective:   Physical Exam  Vitals reviewed. Constitutional: She is oriented to person, place, and time. She appears well-developed and well-nourished. No distress.  HENT:  Head: Normocephalic and atraumatic.  Mouth/Throat: Oropharynx is clear and moist. No oropharyngeal exudate.  Eyes: Conjunctivae are normal. Right eye exhibits no discharge. Left eye exhibits no discharge. No scleral icterus.  Neck: Normal range of motion. Neck supple. No JVD present. No tracheal deviation  present. No thyromegaly present.  Cardiovascular: Normal rate, regular rhythm, normal heart sounds and intact distal pulses.  Exam reveals no gallop and no friction rub.   No murmur heard. Pulmonary/Chest: Effort normal and breath sounds normal. No stridor. No respiratory distress. She has no wheezes. She has no rales. She exhibits no tenderness.  Abdominal: Soft. Bowel sounds are normal. She exhibits no distension and no mass. There is no tenderness. There is no rebound and no guarding.  Musculoskeletal: Normal range of motion. She exhibits edema (in both LE's). She exhibits no tenderness.       Right lower leg: She exhibits tenderness, edema and deformity. She exhibits no bony tenderness, no swelling and no laceration.       Legs: Lymphadenopathy:    She has no cervical adenopathy.  Neurological: She is oriented to person, place, and time.  Skin: Skin is warm and dry. No rash noted. She is not diaphoretic. There is erythema. No pallor.  Psychiatric: She has a normal mood and affect. Her behavior is normal. Judgment and thought content normal.     Lab Results  Component Value Date   WBC 10.3 10/10/2011   HGB 12.6 10/10/2011   HCT 38.7 10/10/2011   PLT 199.0 10/10/2011   GLUCOSE 88 12/03/2011   CHOL 183 04/11/2011   TRIG 126.0 04/11/2011   HDL 46.80 04/11/2011   LDLDIRECT 135.3 11/20/2010   LDLCALC 111* 04/11/2011   ALT 14 09/25/2011   AST 19 09/25/2011   NA 143 12/03/2011   K 4.1 12/03/2011   CL 101 12/03/2011   CREATININE 1.2 12/03/2011   BUN 29* 12/03/2011   CO2 33* 12/03/2011   TSH 1.79 04/11/2011   HGBA1C 6.2 09/25/2011   MICROALBUR 0.5 11/17/2008  Assessment & Plan:

## 2013-02-05 NOTE — Patient Instructions (Signed)
Cellulitis Cellulitis is an infection of the skin and the tissue beneath it. The infected area is usually red and tender. Cellulitis occurs most often in the arms and lower legs.  CAUSES  Cellulitis is caused by bacteria that enter the skin through cracks or cuts in the skin. The most common types of bacteria that cause cellulitis are Staphylococcus and Streptococcus. SYMPTOMS   Redness and warmth.  Swelling.  Tenderness or pain.  Fever. DIAGNOSIS  Your caregiver can usually determine what is wrong based on a physical exam. Blood tests may also be done. TREATMENT  Treatment usually involves taking an antibiotic medicine. HOME CARE INSTRUCTIONS   Take your antibiotics as directed. Finish them even if you start to feel better.  Keep the infected arm or leg elevated to reduce swelling.  Apply a warm cloth to the affected area up to 4 times per day to relieve pain.  Only take over-the-counter or prescription medicines for pain, discomfort, or fever as directed by your caregiver.  Keep all follow-up appointments as directed by your caregiver. SEEK MEDICAL CARE IF:   You notice red streaks coming from the infected area.  Your red area gets larger or turns dark in color.  Your bone or joint underneath the infected area becomes painful after the skin has healed.  Your infection returns in the same area or another area.  You notice a swollen bump in the infected area.  You develop new symptoms. SEEK IMMEDIATE MEDICAL CARE IF:   You have a fever.  You feel very sleepy.  You develop vomiting or diarrhea.  You have a general ill feeling (malaise) with muscle aches and pains. MAKE SURE YOU:   Understand these instructions.  Will watch your condition.  Will get help right away if you are not doing well or get worse. Document Released: 04/25/2005 Document Revised: 01/15/2012 Document Reviewed: 10/01/2011 [employer] Patient Information 2014 [employer].  

## 2013-02-06 ENCOUNTER — Encounter: Payer: Self-pay | Admitting: Internal Medicine

## 2013-02-06 DIAGNOSIS — E559 Vitamin D deficiency, unspecified: Secondary | ICD-10-CM | POA: Insufficient documentation

## 2013-02-06 HISTORY — DX: Vitamin D deficiency, unspecified: E55.9

## 2013-02-06 MED ORDER — CHOLECALCIFEROL 1.25 MG (50000 UT) PO TABS: 1.0000 | ORAL_TABLET | ORAL | Status: DC

## 2013-02-06 NOTE — Addendum Note (Signed)
Addended by: [name] on: 02/06/2013 07:32 AM   Modules accepted: Orders

## 2013-02-06 NOTE — Assessment & Plan Note (Signed)
Start cholecalciferol 50,000 u weekly

## 2013-02-19 ENCOUNTER — Ambulatory Visit (INDEPENDENT_AMBULATORY_CARE_PROVIDER_SITE_OTHER): Payer: Medicare Other | Admitting: Internal Medicine

## 2013-02-19 ENCOUNTER — Other Ambulatory Visit (INDEPENDENT_AMBULATORY_CARE_PROVIDER_SITE_OTHER): Payer: Medicare Other

## 2013-02-19 ENCOUNTER — Encounter: Payer: Self-pay | Admitting: Internal Medicine

## 2013-02-19 VITALS — BP 140/72 | HR 63 | Temp 98.0°F | Resp 16 | Wt 242.8 lb

## 2013-02-19 DIAGNOSIS — L97919 Non-pressure chronic ulcer of unspecified part of right lower leg with unspecified severity: Secondary | ICD-10-CM

## 2013-02-19 DIAGNOSIS — L97909 Non-pressure chronic ulcer of unspecified part of unspecified lower leg with unspecified severity: Secondary | ICD-10-CM

## 2013-02-19 DIAGNOSIS — L02419 Cutaneous abscess of limb, unspecified: Secondary | ICD-10-CM

## 2013-02-19 DIAGNOSIS — D72829 Elevated white blood cell count, unspecified: Secondary | ICD-10-CM | POA: Insufficient documentation

## 2013-02-19 DIAGNOSIS — I10 Essential (primary) hypertension: Secondary | ICD-10-CM

## 2013-02-19 DIAGNOSIS — M8580 Other specified disorders of bone density and structure, unspecified site: Secondary | ICD-10-CM

## 2013-02-19 DIAGNOSIS — E1165 Type 2 diabetes mellitus with hyperglycemia: Secondary | ICD-10-CM

## 2013-02-19 DIAGNOSIS — E785 Hyperlipidemia, unspecified: Secondary | ICD-10-CM

## 2013-02-19 DIAGNOSIS — M949 Disorder of cartilage, unspecified: Secondary | ICD-10-CM

## 2013-02-19 DIAGNOSIS — E1129 Type 2 diabetes mellitus with other diabetic kidney complication: Secondary | ICD-10-CM

## 2013-02-19 LAB — CBC WITH DIFFERENTIAL/PLATELET
Basophils Absolute: 0.1 10*3/uL (ref 0.0–0.1)
Eosinophils Absolute: 0.3 10*3/uL (ref 0.0–0.7)
HCT: 39.3 % (ref 36.0–46.0)
Hemoglobin: 12.9 g/dL (ref 12.0–15.0)
Lymphs Abs: 2.4 10*3/uL (ref 0.7–4.0)
MCHC: 32.7 g/dL (ref 30.0–36.0)
MCV: 84 fl (ref 78.0–100.0)
Monocytes Absolute: 1 10*3/uL (ref 0.1–1.0)
Neutro Abs: 8.2 10*3/uL — ABNORMAL HIGH (ref 1.4–7.7)
Platelets: 234 10*3/uL (ref 150.0–400.0)
RDW: 15.1 % — ABNORMAL HIGH (ref 11.5–14.6)

## 2013-02-19 LAB — SEDIMENTATION RATE: Sed Rate: 32 mm/hr — ABNORMAL HIGH (ref 0–22)

## 2013-02-19 MED ORDER — ROSUVASTATIN CALCIUM 10 MG PO TABS: 10.0000 mg | ORAL_TABLET | Freq: Every day | ORAL | Status: DC

## 2013-02-19 NOTE — Progress Notes (Signed)
  Subjective:    Patient ID: [name], female    DOB: Dec 17, 1940, 72 y.o.   MRN: [id number]  HPI Comments: The ulcer on her right lower leg is better with less redness and swelling but it has not healed. She is refusing to go back to the wound care center and asks that I refer her to dermatology.     Review of Systems  Constitutional: Negative.  Negative for fever, chills, diaphoresis, activity change, appetite change, fatigue and unexpected weight change.  HENT: Negative.   Eyes: Negative.   Respiratory: Negative.  Negative for cough, chest tightness, shortness of breath, wheezing and stridor.   Cardiovascular: Negative.  Negative for chest pain, palpitations and leg swelling.  Gastrointestinal: Negative for nausea, vomiting, abdominal pain, diarrhea and constipation.  Endocrine: Negative.   Genitourinary: Negative.   Musculoskeletal: Negative.  Negative for myalgias, back pain, joint swelling and gait problem.  Skin: Positive for wound. Negative for color change, pallor and rash.  Allergic/Immunologic: Negative.   Neurological: Negative.  Negative for dizziness.  Hematological: Negative.  Negative for adenopathy. Does not bruise/bleed easily.  Psychiatric/Behavioral: Negative.        Objective:   Physical Exam  Vitals reviewed. Constitutional: She is oriented to person, place, and time. She appears well-developed and well-nourished. No distress.  HENT:  Head: Normocephalic and atraumatic.  Mouth/Throat: Oropharynx is clear and moist. No oropharyngeal exudate.  Eyes: Conjunctivae are normal. Right eye exhibits no discharge. Left eye exhibits no discharge. No scleral icterus.  Neck: Normal range of motion. Neck supple. No JVD present. No tracheal deviation present. No thyromegaly present.  Cardiovascular: Normal rate, regular rhythm, normal heart sounds and intact distal pulses.  Exam reveals no gallop and no friction rub.   No murmur heard. Pulmonary/Chest: Effort normal  and breath sounds normal. No stridor. No respiratory distress. She has no wheezes. She has no rales. She exhibits no tenderness.  Abdominal: Soft. Bowel sounds are normal. She exhibits no distension and no mass. There is no tenderness. There is no rebound and no guarding.  Musculoskeletal: Normal range of motion. She exhibits edema (trace edema in BLE). She exhibits no tenderness.  Lymphadenopathy:    She has no cervical adenopathy.  Neurological: She is oriented to person, place, and time.  Skin: Skin is warm, dry and intact. Lesion noted. No abrasion, no bruising, no burn, no ecchymosis, no laceration, no petechiae and no rash noted. She is not diaphoretic. No erythema.     Psychiatric: She has a normal mood and affect. Her behavior is normal. Judgment and thought content normal.      Lab Results  Component Value Date   WBC 15.3* 02/05/2013   HGB 13.6 02/05/2013   HCT 41.4 02/05/2013   PLT 242.0 02/05/2013   GLUCOSE 106* 02/05/2013   CHOL 183 02/05/2013   TRIG 156.0* 02/05/2013   HDL 44.00 02/05/2013   LDLDIRECT 135.3 11/20/2010   LDLCALC 108* 02/05/2013   ALT 16 02/05/2013   AST 21 02/05/2013   NA 135 02/05/2013   K 4.2 02/05/2013   CL 97 02/05/2013   CREATININE 1.2 02/05/2013   BUN 24* 02/05/2013   CO2 38* 02/05/2013   TSH 3.30 02/05/2013   HGBA1C 6.4 02/05/2013   MICROALBUR 15.9* 02/05/2013      Assessment & Plan:

## 2013-02-19 NOTE — Patient Instructions (Signed)
Type 2 Diabetes Mellitus, Adult Type 2 diabetes mellitus, often simply referred to as type 2 diabetes, is a long-lasting (chronic) disease. In type 2 diabetes, the pancreas does not make enough insulin (a hormone), the cells are less responsive to the insulin that is made (insulin resistance), or both. Normally, insulin moves sugars from food into the tissue cells. The tissue cells use the sugars for energy. The lack of insulin or the lack of normal response to insulin causes excess sugars to build up in the blood instead of going into the tissue cells. As a result, high blood sugar (hyperglycemia) develops. The effect of high sugar (glucose) levels can cause many complications. Type 2 diabetes was also previously called adult-onset diabetes but it can occur at any age.  RISK FACTORS  A person is predisposed to developing type 2 diabetes if someone in the family has the disease and also has one or more of the following primary risk factors:  Overweight.  An inactive lifestyle.  A history of consistently eating high-calorie foods. Maintaining a normal weight and regular physical activity can reduce the chance of developing type 2 diabetes. SYMPTOMS  A person with type 2 diabetes may not show symptoms initially. The symptoms of type 2 diabetes appear slowly. The symptoms include:  Increased thirst (polydipsia).  Increased urination (polyuria).  Increased urination during the night (nocturia).  Weight loss. This weight loss may be rapid.  Frequent, recurring infections.  Tiredness (fatigue).  Weakness.  Vision changes, such as blurred vision.  Fruity smell to your breath.  Abdominal pain.  Nausea or vomiting.  Cuts or bruises which are slow to heal.  Tingling or numbness in the hands or feet. DIAGNOSIS Type 2 diabetes is frequently not diagnosed until complications of diabetes are present. Type 2 diabetes is diagnosed when symptoms or complications are present and when blood  glucose levels are increased. Your blood glucose level may be checked by one or more of the following blood tests:  A fasting blood glucose test. You will not be allowed to eat for at least 8 hours before a blood sample is taken.  A random blood glucose test. Your blood glucose is checked at any time of the day regardless of when you ate.  A hemoglobin A1c blood glucose test. A hemoglobin A1c test provides information about blood glucose control over the previous 3 months.  An oral glucose tolerance test (OGTT). Your blood glucose is measured after you have not eaten (fasted) for 2 hours and then after you drink a glucose-containing beverage. TREATMENT   You may need to take insulin or diabetes medicine daily to keep blood glucose levels in the desired range.  You will need to match insulin dosing with exercise and healthy food choices. The treatment goal is to maintain the before meal blood sugar (preprandial glucose) level at 70 130 mg/dL. HOME CARE INSTRUCTIONS   Have your hemoglobin A1c level checked twice a year.  Perform daily blood glucose monitoring as directed by your caregiver.  Monitor urine ketones when you are ill and as directed by your caregiver.  Take your diabetes medicine or insulin as directed by your caregiver to maintain your blood glucose levels in the desired range.  Never run out of diabetes medicine or insulin. It is needed every day.  Adjust insulin based on your intake of carbohydrates. Carbohydrates can raise blood glucose levels but need to be included in your diet. Carbohydrates provide vitamins, minerals, and fiber which are an essential part of   a healthy diet. Carbohydrates are found in fruits, vegetables, whole grains, dairy products, legumes, and foods containing added sugars.    Eat healthy foods. Alternate 3 meals with 3 snacks.  Lose weight if overweight.  Carry a medical alert card or wear your medical alert jewelry.  Carry a 15 gram  carbohydrate snack with you at all times to treat low blood glucose (hypoglycemia). Some examples of 15 gram carbohydrate snacks include:  Glucose tablets, 3 or 4   Glucose gel, 15 gram tube  Raisins, 2 tablespoons (24 grams)  Jelly beans, 6  Animal crackers, 8  Regular pop, 4 ounces (120 mL)  Gummy treats, 9  Recognize hypoglycemia. Hypoglycemia occurs with blood glucose levels of 70 mg/dL and below. The risk for hypoglycemia increases when fasting or skipping meals, during or after intense exercise, and during sleep. Hypoglycemia symptoms can include:  Tremors or shakes.  Decreased ability to concentrate.  Sweating.  Increased heart rate.  Headache.  Dry mouth.  Hunger.  Irritability.  Anxiety.  Restless sleep.  Altered speech or coordination.  Confusion.  Treat hypoglycemia promptly. If you are alert and able to safely swallow, follow the 15:15 rule:  Take 15 20 grams of rapid-acting glucose or carbohydrate. Rapid-acting options include glucose gel, glucose tablets, or 4 ounces (120 mL) of fruit juice, regular soda, or low fat milk.  Check your blood glucose level 15 minutes after taking the glucose.  Take 15 20 grams more of glucose if the repeat blood glucose level is still 70 mg/dL or below.  Eat a meal or snack within 1 hour once blood glucose levels return to normal.    Be alert to polyuria and polydipsia which are early signs of hyperglycemia. An early awareness of hyperglycemia allows for prompt treatment. Treat hyperglycemia as directed by your caregiver.  Engage in at least 150 minutes of moderate-intensity physical activity a week, spread over at least 3 days of the week or as directed by your caregiver. In addition, you should engage in resistance exercise at least 2 times a week or as directed by your caregiver.  Adjust your medicine and food intake as needed if you start a new exercise or sport.  Follow your sick day plan at any time you  are unable to eat or drink as usual.  Avoid tobacco use.  Limit alcohol intake to no more than 1 drink per day for nonpregnant women and 2 drinks per day for men. You should drink alcohol only when you are also eating food. Talk with your caregiver whether alcohol is safe for you. Tell your caregiver if you drink alcohol several times a week.  Follow up with your caregiver regularly.  Schedule an eye exam soon after the diagnosis of type 2 diabetes and then annually.  Perform daily skin and foot care. Examine your skin and feet daily for cuts, bruises, redness, nail problems, bleeding, blisters, or sores. A foot exam by a caregiver should be done annually.  Brush your teeth and gums at least twice a day and floss at least once a day. Follow up with your dentist regularly.  Share your diabetes management plan with your workplace or school.  Stay up-to-date with immunizations.  Learn to manage stress.  Obtain ongoing diabetes education and support as needed.  Participate in, or seek rehabilitation as needed to maintain or improve independence and quality of life. Request a physical or occupational therapy referral if you are having foot or hand numbness or difficulties with grooming,   dressing, eating, or physical activity. SEEK MEDICAL CARE IF:   You are unable to eat food or drink fluids for more than 6 hours.  You have nausea and vomiting for more than 6 hours.  Your blood glucose level is over 240 mg/dL.  There is a change in mental status.  You develop an additional serious illness.  You have diarrhea for more than 6 hours.  You have been sick or have had a fever for a couple of days and are not getting better.  You have pain during any physical activity.  SEEK IMMEDIATE MEDICAL CARE IF:  You have difficulty breathing.  You have moderate to large ketone levels. MAKE SURE YOU:  Understand these instructions.  Will watch your condition.  Will get help right away if  you are not doing well or get worse. Document Released: 07/16/2005 Document Revised: 04/09/2012 Document Reviewed: 02/12/2012 [employer] Patient Information 2014 [employer].  

## 2013-02-22 NOTE — Assessment & Plan Note (Signed)
This has resolved with cipro

## 2013-02-22 NOTE — Assessment & Plan Note (Signed)
Her BP is well controlled 

## 2013-02-22 NOTE — Assessment & Plan Note (Signed)
Her A1C is well controlled No meds needed at this point

## 2013-02-22 NOTE — Assessment & Plan Note (Signed)
This may be PG (her risk factor is the UC), I have ordered a derm a referral

## 2013-02-23 ENCOUNTER — Other Ambulatory Visit: Payer: Self-pay | Admitting: Internal Medicine

## 2013-02-23 ENCOUNTER — Telehealth: Payer: Self-pay | Admitting: Oncology

## 2013-02-23 DIAGNOSIS — D472 Monoclonal gammopathy: Secondary | ICD-10-CM | POA: Insufficient documentation

## 2013-02-23 LAB — PROTEIN ELECTROPHORESIS, SERUM
Alpha-1-Globulin: 5.3 % — ABNORMAL HIGH (ref 2.9–4.9)
Beta 2: 4.3 % (ref 3.2–6.5)
Gamma Globulin: 25 % — ABNORMAL HIGH (ref 11.1–18.8)
M-Spike, %: 1.14 g/dL

## 2013-02-23 NOTE — Telephone Encounter (Signed)
LVOM FOR PT TO RETURN CALL IN RE TO NP APPT.

## 2013-02-24 ENCOUNTER — Ambulatory Visit (HOSPITAL_COMMUNITY)
Admission: RE | Admit: 2013-02-24 | Discharge: 2013-02-24 | Disposition: A | Discharge status: Home / Self Care | Payer: Medicare Other | Source: Ambulatory Visit | Attending: Internal Medicine | Admitting: Internal Medicine

## 2013-02-24 ENCOUNTER — Other Ambulatory Visit: Payer: Self-pay | Admitting: Internal Medicine

## 2013-02-24 DIAGNOSIS — M8580 Other specified disorders of bone density and structure, unspecified site: Secondary | ICD-10-CM

## 2013-02-24 DIAGNOSIS — Z780 Asymptomatic menopausal state: Secondary | ICD-10-CM | POA: Insufficient documentation

## 2013-02-24 DIAGNOSIS — Z13820 Encounter for screening for osteoporosis: Secondary | ICD-10-CM | POA: Insufficient documentation

## 2013-02-24 DIAGNOSIS — Z1231 Encounter for screening mammogram for malignant neoplasm of breast: Secondary | ICD-10-CM | POA: Insufficient documentation

## 2013-02-24 LAB — HM DEXA SCAN

## 2013-02-27 ENCOUNTER — Telehealth: Payer: Self-pay | Admitting: Oncology

## 2013-02-27 ENCOUNTER — Telehealth: Payer: Self-pay | Admitting: *Deleted

## 2013-02-27 NOTE — Telephone Encounter (Signed)
Pt called requesting a status on Bone Marrow referral.  States she wants to verify that she needs it due to her drinking Protein Shakes over the past few months.  Further states she believes that's the reason the protein was elevated.  Please advise

## 2013-02-27 NOTE — Telephone Encounter (Signed)
PT CALLED IN RE TO NP REFERRAL PT STATED BEFORE SCHEDULING APPT SHE WILL CALL REFERRING OFFICE AND WILL CALL BACK.

## 2013-03-01 NOTE — Telephone Encounter (Signed)
This is a protein elevation that may be cancer. It has nothing to do with protein shakes.

## 2013-03-02 NOTE — Telephone Encounter (Signed)
Left msg on VM for pt to return call

## 2013-03-03 ENCOUNTER — Telehealth: Payer: Self-pay | Admitting: *Deleted

## 2013-03-03 NOTE — Telephone Encounter (Signed)
Spoke with pt. Advised of MDs message.

## 2013-03-11 ENCOUNTER — Other Ambulatory Visit: Payer: Self-pay | Admitting: Internal Medicine

## 2013-03-13 ENCOUNTER — Other Ambulatory Visit: Payer: Self-pay | Admitting: Oncology

## 2013-03-13 DIAGNOSIS — D729 Disorder of white blood cells, unspecified: Secondary | ICD-10-CM

## 2013-03-20 ENCOUNTER — Ambulatory Visit: Payer: Medicare Other

## 2013-03-20 ENCOUNTER — Encounter: Payer: Self-pay | Admitting: Oncology

## 2013-03-20 ENCOUNTER — Telehealth: Payer: Self-pay | Admitting: Oncology

## 2013-03-20 ENCOUNTER — Other Ambulatory Visit (HOSPITAL_BASED_OUTPATIENT_CLINIC_OR_DEPARTMENT_OTHER): Payer: Medicare Other | Admitting: Lab

## 2013-03-20 ENCOUNTER — Ambulatory Visit (HOSPITAL_BASED_OUTPATIENT_CLINIC_OR_DEPARTMENT_OTHER): Payer: Medicare Other | Admitting: Oncology

## 2013-03-20 VITALS — BP 223/57 | HR 62 | Temp 97.8°F | Resp 19 | Ht 60.0 in | Wt 243.4 lb

## 2013-03-20 DIAGNOSIS — D729 Disorder of white blood cells, unspecified: Secondary | ICD-10-CM

## 2013-03-20 DIAGNOSIS — M899 Disorder of bone, unspecified: Secondary | ICD-10-CM

## 2013-03-20 DIAGNOSIS — D472 Monoclonal gammopathy: Secondary | ICD-10-CM

## 2013-03-20 DIAGNOSIS — D7289 Other specified disorders of white blood cells: Secondary | ICD-10-CM

## 2013-03-20 LAB — CBC WITH DIFFERENTIAL/PLATELET
Basophils Absolute: 0 10*3/uL (ref 0.0–0.1)
Eosinophils Absolute: 0.3 10*3/uL (ref 0.0–0.5)
HGB: 12.2 g/dL (ref 11.6–15.9)
LYMPH%: 20.4 % (ref 14.0–49.7)
MCH: 26.5 pg (ref 25.1–34.0)
MCV: 85.9 fL (ref 79.5–101.0)
MONO%: 6.7 % (ref 0.0–14.0)
NEUT#: 8.1 10*3/uL — ABNORMAL HIGH (ref 1.5–6.5)
Platelets: 176 10*3/uL (ref 145–400)
RBC: 4.61 10*6/uL (ref 3.70–5.45)

## 2013-03-20 LAB — COMPREHENSIVE METABOLIC PANEL (CC13)
Alkaline Phosphatase: 87 U/L (ref 40–150)
BUN: 21 mg/dL (ref 7.0–26.0)
Creatinine: 1.1 mg/dL (ref 0.6–1.1)
Glucose: 137 mg/dl (ref 70–140)
Total Bilirubin: 0.36 mg/dL (ref 0.20–1.20)

## 2013-03-20 NOTE — Progress Notes (Signed)
Checked in new pt with no financial concerns. °

## 2013-03-20 NOTE — Progress Notes (Signed)
Reason for Referral: Monoclonal gammopathy an evaluation for a plasma cell disorder.   HPI: 72 year old woman with past medical history significant for obesity, hypertension and hyperlipidemia and osteopenia was referred to me for the evaluation for possible plasma disorder. The patient reports that she has been and continue to be in her normal state of health but this subsequently had been complaining of diffuse bone pain. Pain that includes her back shoulder hips and more specifically in her lower extremities. She was evaluated also by her primary care physician for lower extremity edema and part of the evaluation she underwent a serum protein electrophoresis and showed to have an M spike of 1.14 g/dL. That was done on 02/19/2013 and her chemistries at that time showed normal calcium of 9.7, normal protein at 8.2 a normal albumin of 3.6. Her creatinine is 1.2 with EGFR 46.86. Patient denied any recent recurrent sinopulmonary infections or any hospitalizations.  Clinically, she still functional and perform most activities of daily living without any hindrance or decline. She is able to drive to perform her house work without any significant decline. She is not reporting any chest pain, shortness of breath, difficulty breathing, hemoptysis. Is not reporting any nausea or vomiting or abdominal pain. Has not reported any peripheral neuropathy or change in her mental status with confusion. She has not reported any bleeding or clotting tendencies.   Past Medical History  Diagnosis Date  . HTN (hypertension) 07/30/1988  . GERD (gastroesophageal reflux disease) 07/30/92  . COPD (chronic obstructive pulmonary disease) 01/27/98  . Hyperlipidemia 11/27/96  . CHF (congestive heart failure) 08/31/03  . Gout   . Renal insufficiency   . Kidney stone 1960, 1972, 1991  . Ulcerative colitis   :   Current Outpatient Prescriptions  Medication Sig Dispense Refill  . albuterol (PROAIR HFA) 108 (90 BASE) MCG/ACT inhaler  Inhale 2 puffs into the lungs 2 (two) times daily.  1 Inhaler  2  . allopurinol (ZYLOPRIM) 100 MG tablet TAKE 1 TABLET BY MOUTH DAILY FOR GOUT  90 tablet  3  . aspirin 81 MG EC tablet Take 81 mg by mouth daily.        Marland Kitchen BYSTOLIC 5 MG tablet TAKE 1 TABLET BY MOUTH DAILY  90 tablet  3  . Cholecalciferol 50000 UNITS TABS Take 1 tablet by mouth once a week.  12 tablet  3  . Dietary Management Product (VASCULERA) TABS Take 1 capsule by mouth daily.  30 tablet  11  . Fluticasone-Salmeterol (ADVAIR DISKUS) 250-50 MCG/DOSE AEPB Inhale 1 puff into the lungs 2 (two) times daily.  1 each  0  . furosemide (LASIX) 40 MG tablet TAKE 1 TABLET BY MOUTH TWICE DAILY  180 tablet  2  . losartan (COZAAR) 100 MG tablet TAKE 1 TABLET BY MOUTH EVERY DAY  90 tablet  1  . Probiotic Product (ALIGN) 4 MG CAPS Take by mouth daily.        . rosuvastatin (CRESTOR) 10 MG tablet Take 1 tablet (10 mg total) by mouth daily.  90 tablet  3  . tiotropium (SPIRIVA) 18 MCG inhalation capsule Place 18 mcg into inhaler and inhale daily.        . [DISCONTINUED] olmesartan-hydrochlorothiazide (BENICAR HCT) 40-25 MG per tablet Take 1 tablet by mouth daily.  90 tablet  3   No current facility-administered medications for this visit.       Allergies  Allergen Reactions  . Celebrex [Celecoxib]     edema  . Enalapril Cough  .  Amlodipine Besylate     REACTION: edema  . Amoxicillin     REACTION: Diarrhea  . Codeine Sulfate     REACTION: Nausea  . Hydrocodone-Acetaminophen     REACTION: Nausea  :  Family History  Problem Relation Age of Onset  . Stroke Mother   . Diabetes Mother   . Kidney cancer Mother   . Stomach cancer Mother   . Heart disease Mother   . Stroke Father   . Heart disease Sister     CATH,STENT  . Clotting disorder Sister   . Cervical cancer Sister   . Heart disease Brother   . Colon cancer Neg Hx   . Heart disease Brother   . Crohn's disease Brother   . Lung cancer Sister   . Heart attack Sister    . Diabetes Sister   :  History   Social History  . Marital Status: Married    Spouse Name: N/A    Number of Children: 3  . Years of Education: N/A   Occupational History  .  [employer]   Social History Main Topics  . Smoking status: Former Research scientist (life sciences)  . Smokeless tobacco: Never Used     Comment: Quit 1998  . Alcohol Use: No  . Drug Use: No  . Sexual Activity: Not Currently   Other Topics Concern  . Not on file   Social History Narrative   Daily Caffeine Use:  4   Regular Exercise -  NO        :  Pertinent items are noted in HPI.  Exam:ECOG 1 Blood pressure 223/57, pulse 62, temperature 97.8 F (36.6 C), temperature source Oral, resp. rate 19, height 5' (1.524 m), weight 243 lb 6.4 oz (110.406 kg), SpO2 95.00%. General appearance: alert, cooperative and appears stated age Head: Normocephalic, without obvious abnormality, atraumatic Nose: Nares normal. Septum midline. Mucosa normal. No drainage or sinus tenderness. Throat: lips, mucosa, and tongue normal; teeth and gums normal Neck: no adenopathy, no carotid bruit, no JVD, supple, symmetrical, trachea midline and thyroid not enlarged, symmetric, no tenderness/mass/nodules Back: negative Resp: clear to auscultation bilaterally Chest wall: no tenderness Cardio: regular rate and rhythm, S1, S2 normal, no murmur, click, rub or gallop GI: soft, non-tender; bowel sounds normal; no masses,  no organomegaly Extremities: edema 1+ Pulses: 2+ and symmetric Skin: Skin color, texture, turgor normal. No rashes or lesions   Recent Labs  03/20/13 1335  WBC 11.6*  HGB 12.2  HCT 39.6  PLT 176    Assessment and Plan:   72 year old woman with the following issues:  1. Monoclonal gammopathy with an M spike of about 1 g/dL. The differential diagnosis of a monoclonal protein discussed today which includes a plasma cell disorders such as monoclonal gammopathy of undetermined significance, multiple myeloma, amyloidosis and possible  reactive findings. In reviewing her laboratory data I see no clear-cut evidence of end organ damage. She has very mild renal insufficiency but normal hemoglobin, calcium, total protein, albumin. She does have mild osteopenia which could be on related problem. To work this up completely, I will obtain a repeat his serum protein electrophoresis with quantitative immunoglobulins and immunofixation. I will also obtain a skeletal survey to rule out any lytic bony lesions. After this workup is complete, I will give my final opinion regarding these findings. I also explained to her that is possibly she might need a bone marrow biopsy if I find enough evidence to suggest multiple myeloma.  2. Osteopenia and possible osteoporosis:  This might explain her bone pain and and less likely related to a plasma cell disorder. However this is to be ruled out completely and I will be part of the multiple myeloma workup.

## 2013-03-20 NOTE — Telephone Encounter (Signed)
gv and printed appt sched and avs for pt for Aug and Sept

## 2013-03-24 ENCOUNTER — Ambulatory Visit (HOSPITAL_COMMUNITY)
Admission: RE | Admit: 2013-03-24 | Discharge: 2013-03-24 | Disposition: A | Discharge status: Home / Self Care | Payer: Medicare Other | Source: Ambulatory Visit | Attending: Oncology | Admitting: Oncology

## 2013-03-24 DIAGNOSIS — D472 Monoclonal gammopathy: Secondary | ICD-10-CM | POA: Insufficient documentation

## 2013-03-24 DIAGNOSIS — M25559 Pain in unspecified hip: Secondary | ICD-10-CM | POA: Insufficient documentation

## 2013-03-24 DIAGNOSIS — M549 Dorsalgia, unspecified: Secondary | ICD-10-CM | POA: Insufficient documentation

## 2013-03-24 LAB — SPEP & IFE WITH QIG
Albumin ELP: 48.1 % — ABNORMAL LOW (ref 55.8–66.1)
Beta 2: 4 % (ref 3.2–6.5)
IgA: 173 mg/dL (ref 69–380)
IgM, Serum: 63 mg/dL (ref 52–322)
Total Protein, Serum Electrophoresis: 7.2 g/dL (ref 6.0–8.3)

## 2013-03-24 LAB — KAPPA/LAMBDA LIGHT CHAINS
Kappa:Lambda Ratio: 29.15 — ABNORMAL HIGH (ref 0.26–1.65)
Lambda Free Lght Chn: 1.88 mg/dL (ref 0.57–2.63)

## 2013-04-01 ENCOUNTER — Other Ambulatory Visit: Payer: Self-pay | Admitting: Internal Medicine

## 2013-04-08 ENCOUNTER — Ambulatory Visit (HOSPITAL_BASED_OUTPATIENT_CLINIC_OR_DEPARTMENT_OTHER): Payer: Medicare Other | Admitting: Oncology

## 2013-04-08 ENCOUNTER — Telehealth: Payer: Self-pay | Admitting: Oncology

## 2013-04-08 VITALS — BP 191/64 | HR 61 | Temp 97.6°F | Resp 18 | Ht 60.0 in | Wt 246.9 lb

## 2013-04-08 DIAGNOSIS — D472 Monoclonal gammopathy: Secondary | ICD-10-CM

## 2013-04-08 NOTE — Addendum Note (Signed)
Addended by: Randolm Idol on: 04/08/2013 05:20 PM   Modules accepted: Medications

## 2013-04-08 NOTE — Telephone Encounter (Signed)
gv and printed appt sched and avs forpt for March 2015

## 2013-04-08 NOTE — Progress Notes (Signed)
Hematology and Oncology Follow Up Visit  [name] [id number] 23-Nov-1940 72 y.o. 04/08/2013 4:19 PM [name], MDJones, Arvid Right, MD   Principle Diagnosis: 72 year old woman with IgG kappa monoclonal gammopathy most likely representing MGUS without any evidence of end organ damage. This was diagnosed in August of 2014. She presented with an M spike of about 1 g/dL on a mildly elevated IgG level of 1800.  Current therapy: Observation and surveillance.  Interim History:  This is a pleasant woman after my initial consultation in August of 2014. Since her last visit, she has been completely asymptomatic. She has diffuse bone discomfort that have been chronic in nature but her skeletal survey did not show any evidence of any bony lesions. She has not reported any hospitalization or illnesses she has not reported any opportunistic infections or pathological fractures. Her health status continue to be around baseline.  Medications: I have reviewed the patient's current medications.  Current Outpatient Prescriptions  Medication Sig Dispense Refill  . albuterol (PROAIR HFA) 108 (90 BASE) MCG/ACT inhaler Inhale 2 puffs into the lungs 2 (two) times daily.  1 Inhaler  2  . allopurinol (ZYLOPRIM) 100 MG tablet TAKE 1 TABLET BY MOUTH DAILY FOR GOUT  90 tablet  3  . aspirin 81 MG EC tablet Take 81 mg by mouth daily.        Marland Kitchen BYSTOLIC 5 MG tablet TAKE 1 TABLET BY MOUTH DAILY  90 tablet  3  . Cholecalciferol 50000 UNITS TABS Take 1 tablet by mouth once a week.  12 tablet  3  . Dietary Management Product (VASCULERA) TABS Take 1 capsule by mouth daily.  30 tablet  11  . Fluticasone-Salmeterol (ADVAIR DISKUS) 250-50 MCG/DOSE AEPB Inhale 1 puff into the lungs 2 (two) times daily.  1 each  0  . furosemide (LASIX) 40 MG tablet TAKE 1 TABLET BY MOUTH TWICE DAILY  180 tablet  2  . losartan (COZAAR) 100 MG tablet TAKE 1 TABLET BY MOUTH EVERY DAY  90 tablet  3  . Probiotic Product (ALIGN) 4 MG CAPS Take by  mouth daily.        . rosuvastatin (CRESTOR) 10 MG tablet Take 1 tablet (10 mg total) by mouth daily.  90 tablet  3  . tiotropium (SPIRIVA) 18 MCG inhalation capsule Place 18 mcg into inhaler and inhale daily.        . [DISCONTINUED] olmesartan-hydrochlorothiazide (BENICAR HCT) 40-25 MG per tablet Take 1 tablet by mouth daily.  90 tablet  3   No current facility-administered medications for this visit.     Allergies:  Allergies  Allergen Reactions  . Celebrex [Celecoxib]     edema  . Enalapril Cough  . Amlodipine Besylate     REACTION: edema  . Amoxicillin     REACTION: Diarrhea  . Codeine Sulfate     REACTION: Nausea  . Hydrocodone-Acetaminophen     REACTION: Nausea    Past Medical History, Surgical history, Social history, and Family History were reviewed and updated.  Review of Systems: Remaining ROS negative. Physical Exam: Blood pressure 191/64, pulse 61, temperature 97.6 F (36.4 C), temperature source Oral, resp. rate 18, height 5' (1.524 m), weight 246 lb 14.4 oz (111.993 kg), SpO2 97.00%. ECOG: 1 General appearance: alert and cooperative Head: Normocephalic, without obvious abnormality, atraumatic Neck: no adenopathy, no carotid bruit, no JVD, supple, symmetrical, trachea midline and thyroid not enlarged, symmetric, no tenderness/mass/nodules Lymph nodes: Cervical, supraclavicular, and axillary nodes normal. Heart:regular rate and  rhythm, S1, S2 normal, no murmur, click, rub or gallop Lung:chest clear, no wheezing, rales, normal symmetric air entry Abdomin: soft, non-tender, without masses or organomegaly EXT:no erythema, induration, or nodules   Lab Results: Lab Results  Component Value Date   WBC 11.6* 03/20/2013   HGB 12.2 03/20/2013   HCT 39.6 03/20/2013   MCV 85.9 03/20/2013   PLT 176 03/20/2013     Chemistry      Component Value Date/Time   NA 142 03/20/2013 1335   NA 135 02/05/2013 1336   K 3.9 03/20/2013 1335   K 4.2 02/05/2013 1336   CL 97 02/05/2013  1336   CO2 31* 03/20/2013 1335   CO2 38* 02/05/2013 1336   BUN 21.0 03/20/2013 1335   BUN 24* 02/05/2013 1336   CREATININE 1.1 03/20/2013 1335   CREATININE 1.2 02/05/2013 1336      Component Value Date/Time   CALCIUM 9.9 03/20/2013 1335   CALCIUM 9.7 02/05/2013 1336   ALKPHOS 87 03/20/2013 1335   ALKPHOS 79 02/05/2013 1336   AST 16 03/20/2013 1335   AST 21 02/05/2013 1336   ALT 12 03/20/2013 1335   ALT 16 02/05/2013 1336   BILITOT 0.36 03/20/2013 1335   BILITOT 0.6 02/05/2013 1336     Results for KANAE, IGNATOWSKI (MRN [id number]) as of 04/08/2013 15:30  Ref. Range 03/20/2013 13:35  IgG (Immunoglobin G), Serum Latest Range: [phone] mg/dL 1810 (H)  IgA Latest Range: 69-380 mg/dL 173  IgM, Serum Latest Range: 52-322 mg/dL 63  Total Protein, Serum Electrophoresis Latest Range: 6.0-8.3 g/dL 7.2  Kappa free light chain Latest Range: 0.33-1.94 mg/dL 54.80 (H)  Lambda Free Lght Chn Latest Range: 0.57-2.63 mg/dL 1.88  Kappa:Lambda Ratio Latest Range: 0.26-1.65  29.15 (H)    Radiological Studies: METASTATIC BONE SURVEY  Comparison: None.  Findings: There are no osteolytic lesions to suggest multiple  myeloma.  There are no osteoblastic lesions.  The bones are diffusely demineralized.  The frontal chest radiograph shows mild cardiomegaly, central  vascular congestion without overt edema and lung hyperexpansion  with some increased markings in the bases, likely chronic.  There are degenerative changes throughout the visualized spine.  IMPRESSION:  There are no osteolytic lesions to suggest multiple myeloma. No  evidence of neoplastic disease to bone.    Impression and Plan:  72 year old woman with the following issues:  1. IgG kappa monoclonal protein most likely represents MGUS without any evidence to suggest end organ damage or multiple myeloma. The natural course of this particular disease was discussed as well as the management options. I suggested that observation and surveillance and  followup given the fact that she is at an increased risk of developing multiple myeloma in the future. I will recheck her protein studies in 6 months and probably in 12 months after that. She develops any signs of symptoms to suggest end organ damage, I will restage her with a bone marrow biopsy at that time.  2. Diffuse pain: Under related to her plasma cell disorder more likely related to arthritis at this time.   Zola Button, MD 9/10/20144:19 PM

## 2013-05-14 ENCOUNTER — Other Ambulatory Visit: Payer: Self-pay | Admitting: Internal Medicine

## 2013-06-01 ENCOUNTER — Encounter (HOSPITAL_COMMUNITY): Payer: Self-pay | Admitting: Emergency Medicine

## 2013-06-01 ENCOUNTER — Ambulatory Visit (INDEPENDENT_AMBULATORY_CARE_PROVIDER_SITE_OTHER): Payer: Medicare Other | Admitting: Internal Medicine

## 2013-06-01 ENCOUNTER — Inpatient Hospital Stay (HOSPITAL_COMMUNITY)
Admission: EM | Admit: 2013-06-01 | Discharge: 2013-06-05 | DRG: 291 | Disposition: A | Discharge status: Home Health | Payer: Medicare Other | Attending: Internal Medicine | Admitting: Internal Medicine

## 2013-06-01 ENCOUNTER — Observation Stay (HOSPITAL_COMMUNITY): Payer: Medicare Other

## 2013-06-01 ENCOUNTER — Encounter: Payer: Self-pay | Admitting: Internal Medicine

## 2013-06-01 VITALS — BP 138/66 | HR 69 | Temp 98.3°F | Resp 16 | Ht 60.0 in | Wt 238.0 lb

## 2013-06-01 DIAGNOSIS — I10 Essential (primary) hypertension: Secondary | ICD-10-CM

## 2013-06-01 DIAGNOSIS — R600 Localized edema: Secondary | ICD-10-CM | POA: Diagnosis present

## 2013-06-01 DIAGNOSIS — L03115 Cellulitis of right lower limb: Secondary | ICD-10-CM | POA: Diagnosis present

## 2013-06-01 DIAGNOSIS — Z7982 Long term (current) use of aspirin: Secondary | ICD-10-CM

## 2013-06-01 DIAGNOSIS — Z8249 Family history of ischemic heart disease and other diseases of the circulatory system: Secondary | ICD-10-CM

## 2013-06-01 DIAGNOSIS — D472 Monoclonal gammopathy: Secondary | ICD-10-CM | POA: Diagnosis present

## 2013-06-01 DIAGNOSIS — Z800 Family history of malignant neoplasm of digestive organs: Secondary | ICD-10-CM

## 2013-06-01 DIAGNOSIS — L02419 Cutaneous abscess of limb, unspecified: Secondary | ICD-10-CM | POA: Diagnosis present

## 2013-06-01 DIAGNOSIS — D519 Vitamin B12 deficiency anemia, unspecified: Secondary | ICD-10-CM | POA: Diagnosis present

## 2013-06-01 DIAGNOSIS — N179 Acute kidney failure, unspecified: Secondary | ICD-10-CM | POA: Diagnosis present

## 2013-06-01 DIAGNOSIS — E538 Deficiency of other specified B group vitamins: Secondary | ICD-10-CM | POA: Diagnosis present

## 2013-06-01 DIAGNOSIS — M8580 Other specified disorders of bone density and structure, unspecified site: Secondary | ICD-10-CM

## 2013-06-01 DIAGNOSIS — K219 Gastro-esophageal reflux disease without esophagitis: Secondary | ICD-10-CM | POA: Diagnosis present

## 2013-06-01 DIAGNOSIS — Z87442 Personal history of urinary calculi: Secondary | ICD-10-CM

## 2013-06-01 DIAGNOSIS — Z8049 Family history of malignant neoplasm of other genital organs: Secondary | ICD-10-CM

## 2013-06-01 DIAGNOSIS — R6883 Chills (without fever): Secondary | ICD-10-CM | POA: Diagnosis present

## 2013-06-01 DIAGNOSIS — Z1231 Encounter for screening mammogram for malignant neoplasm of breast: Secondary | ICD-10-CM

## 2013-06-01 DIAGNOSIS — G4733 Obstructive sleep apnea (adult) (pediatric): Secondary | ICD-10-CM | POA: Diagnosis present

## 2013-06-01 DIAGNOSIS — M109 Gout, unspecified: Secondary | ICD-10-CM | POA: Diagnosis present

## 2013-06-01 DIAGNOSIS — R5381 Other malaise: Secondary | ICD-10-CM | POA: Diagnosis present

## 2013-06-01 DIAGNOSIS — I509 Heart failure, unspecified: Secondary | ICD-10-CM | POA: Diagnosis present

## 2013-06-01 DIAGNOSIS — J441 Chronic obstructive pulmonary disease with (acute) exacerbation: Secondary | ICD-10-CM

## 2013-06-01 DIAGNOSIS — E559 Vitamin D deficiency, unspecified: Secondary | ICD-10-CM

## 2013-06-01 DIAGNOSIS — J449 Chronic obstructive pulmonary disease, unspecified: Secondary | ICD-10-CM | POA: Diagnosis present

## 2013-06-01 DIAGNOSIS — E785 Hyperlipidemia, unspecified: Secondary | ICD-10-CM | POA: Diagnosis present

## 2013-06-01 DIAGNOSIS — Z87891 Personal history of nicotine dependence: Secondary | ICD-10-CM

## 2013-06-01 DIAGNOSIS — N259 Disorder resulting from impaired renal tubular function, unspecified: Secondary | ICD-10-CM

## 2013-06-01 DIAGNOSIS — R05 Cough: Secondary | ICD-10-CM

## 2013-06-01 DIAGNOSIS — Z823 Family history of stroke: Secondary | ICD-10-CM

## 2013-06-01 DIAGNOSIS — L97909 Non-pressure chronic ulcer of unspecified part of unspecified lower leg with unspecified severity: Secondary | ICD-10-CM | POA: Diagnosis present

## 2013-06-01 DIAGNOSIS — I5033 Acute on chronic diastolic (congestive) heart failure: Secondary | ICD-10-CM

## 2013-06-01 DIAGNOSIS — E1129 Type 2 diabetes mellitus with other diabetic kidney complication: Secondary | ICD-10-CM

## 2013-06-01 DIAGNOSIS — Z6841 Body Mass Index (BMI) 40.0 and over, adult: Secondary | ICD-10-CM

## 2013-06-01 DIAGNOSIS — J209 Acute bronchitis, unspecified: Secondary | ICD-10-CM

## 2013-06-01 DIAGNOSIS — J9620 Acute and chronic respiratory failure, unspecified whether with hypoxia or hypercapnia: Secondary | ICD-10-CM

## 2013-06-01 DIAGNOSIS — R609 Edema, unspecified: Secondary | ICD-10-CM

## 2013-06-01 DIAGNOSIS — R61 Generalized hyperhidrosis: Secondary | ICD-10-CM | POA: Diagnosis present

## 2013-06-01 DIAGNOSIS — Z801 Family history of malignant neoplasm of trachea, bronchus and lung: Secondary | ICD-10-CM

## 2013-06-01 DIAGNOSIS — R252 Cramp and spasm: Secondary | ICD-10-CM

## 2013-06-01 DIAGNOSIS — J4489 Other specified chronic obstructive pulmonary disease: Secondary | ICD-10-CM | POA: Diagnosis present

## 2013-06-01 DIAGNOSIS — L280 Lichen simplex chronicus: Secondary | ICD-10-CM | POA: Diagnosis present

## 2013-06-01 DIAGNOSIS — I129 Hypertensive chronic kidney disease with stage 1 through stage 4 chronic kidney disease, or unspecified chronic kidney disease: Secondary | ICD-10-CM | POA: Diagnosis present

## 2013-06-01 DIAGNOSIS — M79609 Pain in unspecified limb: Secondary | ICD-10-CM | POA: Diagnosis present

## 2013-06-01 DIAGNOSIS — N183 Chronic kidney disease, stage 3 unspecified: Secondary | ICD-10-CM | POA: Diagnosis present

## 2013-06-01 DIAGNOSIS — Z880 Allergy status to penicillin: Secondary | ICD-10-CM

## 2013-06-01 DIAGNOSIS — D72829 Elevated white blood cell count, unspecified: Secondary | ICD-10-CM

## 2013-06-01 DIAGNOSIS — L97919 Non-pressure chronic ulcer of unspecified part of right lower leg with unspecified severity: Secondary | ICD-10-CM

## 2013-06-01 DIAGNOSIS — N1832 Chronic kidney disease, stage 3b: Secondary | ICD-10-CM | POA: Diagnosis present

## 2013-06-01 DIAGNOSIS — I872 Venous insufficiency (chronic) (peripheral): Secondary | ICD-10-CM

## 2013-06-01 DIAGNOSIS — Z833 Family history of diabetes mellitus: Secondary | ICD-10-CM

## 2013-06-01 DIAGNOSIS — Z8051 Family history of malignant neoplasm of kidney: Secondary | ICD-10-CM

## 2013-06-01 DIAGNOSIS — E669 Obesity, unspecified: Secondary | ICD-10-CM | POA: Diagnosis present

## 2013-06-01 DIAGNOSIS — K5150 Left sided colitis without complications: Secondary | ICD-10-CM | POA: Diagnosis present

## 2013-06-01 LAB — CBC WITH DIFFERENTIAL/PLATELET
Basophils Absolute: 0 10*3/uL (ref 0.0–0.1)
Basophils Relative: 0 % (ref 0–1)
HCT: 38.2 % (ref 36.0–46.0)
MCHC: 32.2 g/dL (ref 30.0–36.0)
Monocytes Absolute: 1.1 10*3/uL — ABNORMAL HIGH (ref 0.1–1.0)
Neutro Abs: 8 10*3/uL — ABNORMAL HIGH (ref 1.7–7.7)
RDW: 15.1 % (ref 11.5–15.5)

## 2013-06-01 LAB — COMPREHENSIVE METABOLIC PANEL
AST: 16 U/L (ref 0–37)
Albumin: 3.4 g/dL — ABNORMAL LOW (ref 3.5–5.2)
Calcium: 10 mg/dL (ref 8.4–10.5)
Chloride: 98 mEq/L (ref 96–112)
Creatinine, Ser: 1.16 mg/dL — ABNORMAL HIGH (ref 0.50–1.10)
Total Bilirubin: 0.5 mg/dL (ref 0.3–1.2)

## 2013-06-01 LAB — URINALYSIS W MICROSCOPIC + REFLEX CULTURE
Glucose, UA: NEGATIVE mg/dL
Hgb urine dipstick: NEGATIVE
Specific Gravity, Urine: 1.015 (ref 1.005–1.030)
pH: 7 (ref 5.0–8.0)

## 2013-06-01 MED ORDER — POLYETHYLENE GLYCOL 3350 17 G PO PACK
17.0000 g | PACK | Freq: Every day | ORAL | Status: DC | PRN
  Administered 2013-06-05: 17 g via ORAL
  Filled 2013-06-01 (×2): qty 1

## 2013-06-01 MED ORDER — ALBUTEROL SULFATE HFA 108 (90 BASE) MCG/ACT IN AERS
2.0000 | INHALATION_SPRAY | Freq: Two times a day (BID) | RESPIRATORY_TRACT | Status: DC
  Administered 2013-06-01 – 2013-06-05 (×8): 2 via RESPIRATORY_TRACT
  Filled 2013-06-01: qty 6.7

## 2013-06-01 MED ORDER — MOMETASONE FURO-FORMOTEROL FUM 100-5 MCG/ACT IN AERO
2.0000 | INHALATION_SPRAY | Freq: Two times a day (BID) | RESPIRATORY_TRACT | Status: DC
  Administered 2013-06-01 – 2013-06-05 (×7): 2 via RESPIRATORY_TRACT
  Filled 2013-06-01: qty 8.8

## 2013-06-01 MED ORDER — FUROSEMIDE 40 MG PO TABS
40.0000 mg | ORAL_TABLET | Freq: Two times a day (BID) | ORAL | Status: DC
  Administered 2013-06-02: 40 mg via ORAL
  Filled 2013-06-01 (×3): qty 1

## 2013-06-01 MED ORDER — FUROSEMIDE 10 MG/ML IJ SOLN
40.0000 mg | Freq: Once | INTRAMUSCULAR | Status: AC
  Administered 2013-06-01: 40 mg via INTRAVENOUS
  Filled 2013-06-01: qty 4

## 2013-06-01 MED ORDER — ONDANSETRON HCL 4 MG/2ML IJ SOLN: 4.0000 mg | Freq: Four times a day (QID) | INTRAMUSCULAR | Status: DC | PRN

## 2013-06-01 MED ORDER — INFLUENZA VAC SPLIT QUAD 0.5 ML IM SUSP
0.5000 mL | INTRAMUSCULAR | Status: AC
  Administered 2013-06-02: 0.5 mL via INTRAMUSCULAR
  Filled 2013-06-01 (×2): qty 0.5

## 2013-06-01 MED ORDER — ACETAMINOPHEN 325 MG PO TABS
650.0000 mg | ORAL_TABLET | Freq: Four times a day (QID) | ORAL | Status: DC | PRN
  Administered 2013-06-03: 650 mg via ORAL
  Administered 2013-06-05: 325 mg via ORAL
  Filled 2013-06-01 (×2): qty 2

## 2013-06-01 MED ORDER — VANCOMYCIN HCL IN DEXTROSE 1-5 GM/200ML-% IV SOLN
1000.0000 mg | Freq: Once | INTRAVENOUS | Status: AC
  Administered 2013-06-01: 1000 mg via INTRAVENOUS
  Filled 2013-06-01: qty 200

## 2013-06-01 MED ORDER — ASPIRIN EC 81 MG PO TBEC
81.0000 mg | DELAYED_RELEASE_TABLET | Freq: Every day | ORAL | Status: DC
  Administered 2013-06-02 – 2013-06-05 (×4): 81 mg via ORAL
  Filled 2013-06-01 (×4): qty 1

## 2013-06-01 MED ORDER — ZOLPIDEM TARTRATE 5 MG PO TABS: 5.0000 mg | ORAL_TABLET | Freq: Every evening | ORAL | Status: DC | PRN

## 2013-06-01 MED ORDER — ALUM & MAG HYDROXIDE-SIMETH 200-200-20 MG/5ML PO SUSP: 30.0000 mL | Freq: Four times a day (QID) | ORAL | Status: DC | PRN

## 2013-06-01 MED ORDER — DEXTROSE 5 % IV SOLN
1.0000 g | Freq: Three times a day (TID) | INTRAVENOUS | Status: DC
  Administered 2013-06-02: 1 g via INTRAVENOUS
  Filled 2013-06-01 (×3): qty 1

## 2013-06-01 MED ORDER — ATORVASTATIN CALCIUM 10 MG PO TABS
10.0000 mg | ORAL_TABLET | Freq: Every day | ORAL | Status: DC
  Administered 2013-06-02 – 2013-06-04 (×3): 10 mg via ORAL
  Filled 2013-06-01 (×4): qty 1

## 2013-06-01 MED ORDER — ALLOPURINOL 100 MG PO TABS
100.0000 mg | ORAL_TABLET | Freq: Every day | ORAL | Status: DC
  Administered 2013-06-01 – 2013-06-05 (×5): 100 mg via ORAL
  Filled 2013-06-01 (×5): qty 1

## 2013-06-01 MED ORDER — NEBIVOLOL HCL 5 MG PO TABS
5.0000 mg | ORAL_TABLET | Freq: Every day | ORAL | Status: DC
  Administered 2013-06-02 – 2013-06-05 (×4): 5 mg via ORAL
  Filled 2013-06-01 (×4): qty 1

## 2013-06-01 MED ORDER — LOSARTAN POTASSIUM 50 MG PO TABS
100.0000 mg | ORAL_TABLET | Freq: Every day | ORAL | Status: DC
  Administered 2013-06-02 – 2013-06-05 (×4): 100 mg via ORAL
  Filled 2013-06-01 (×4): qty 2

## 2013-06-01 MED ORDER — ACETAMINOPHEN 650 MG RE SUPP: 650.0000 mg | Freq: Four times a day (QID) | RECTAL | Status: DC | PRN

## 2013-06-01 MED ORDER — HEPARIN SODIUM (PORCINE) 5000 UNIT/ML IJ SOLN
5000.0000 [IU] | Freq: Three times a day (TID) | INTRAMUSCULAR | Status: DC
  Administered 2013-06-01 – 2013-06-05 (×10): 5000 [IU] via SUBCUTANEOUS
  Filled 2013-06-01 (×14): qty 1

## 2013-06-01 MED ORDER — DEXTROSE 5 % IV SOLN
1.0000 g | INTRAVENOUS | Status: AC
  Administered 2013-06-01: 1 g via INTRAVENOUS
  Filled 2013-06-01: qty 1

## 2013-06-01 MED ORDER — VANCOMYCIN HCL IN DEXTROSE 1-5 GM/200ML-% IV SOLN
1000.0000 mg | INTRAVENOUS | Status: AC
  Administered 2013-06-01: 1000 mg via INTRAVENOUS
  Filled 2013-06-01: qty 200

## 2013-06-01 MED ORDER — TIOTROPIUM BROMIDE MONOHYDRATE 18 MCG IN CAPS
18.0000 ug | ORAL_CAPSULE | Freq: Every day | RESPIRATORY_TRACT | Status: DC
  Administered 2013-06-01 – 2013-06-04 (×4): 18 ug via RESPIRATORY_TRACT
  Filled 2013-06-01 (×2): qty 5

## 2013-06-01 MED ORDER — ONDANSETRON HCL 4 MG PO TABS: 4.0000 mg | ORAL_TABLET | Freq: Four times a day (QID) | ORAL | Status: DC | PRN

## 2013-06-01 NOTE — Patient Instructions (Signed)
Cellulitis Cellulitis is an infection of the skin and the tissue beneath it. The infected area is usually red and tender. Cellulitis occurs most often in the arms and lower legs.  CAUSES  Cellulitis is caused by bacteria that enter the skin through cracks or cuts in the skin. The most common types of bacteria that cause cellulitis are Staphylococcus and Streptococcus. SYMPTOMS   Redness and warmth.  Swelling.  Tenderness or pain.  Fever. DIAGNOSIS  Your caregiver can usually determine what is wrong based on a physical exam. Blood tests may also be done. TREATMENT  Treatment usually involves taking an antibiotic medicine. HOME CARE INSTRUCTIONS   Take your antibiotics as directed. Finish them even if you start to feel better.  Keep the infected arm or leg elevated to reduce swelling.  Apply a warm cloth to the affected area up to 4 times per day to relieve pain.  Only take over-the-counter or prescription medicines for pain, discomfort, or fever as directed by your caregiver.  Keep all follow-up appointments as directed by your caregiver. SEEK MEDICAL CARE IF:   You notice red streaks coming from the infected area.  Your red area gets larger or turns dark in color.  Your bone or joint underneath the infected area becomes painful after the skin has healed.  Your infection returns in the same area or another area.  You notice a swollen bump in the infected area.  You develop new symptoms. SEEK IMMEDIATE MEDICAL CARE IF:   You have a fever.  You feel very sleepy.  You develop vomiting or diarrhea.  You have a general ill feeling (malaise) with muscle aches and pains. MAKE SURE YOU:   Understand these instructions.  Will watch your condition.  Will get help right away if you are not doing well or get worse. Document Released: 04/25/2005 Document Revised: 01/15/2012 Document Reviewed: 10/01/2011 [employer] Patient Information 2014 [employer].  

## 2013-06-01 NOTE — ED Notes (Signed)
Pt states that her legs have been weeping and swelling for several weeks and was sent in by Dr. Ronnald Ramp to be evaluated and possibly get antibiotics.

## 2013-06-01 NOTE — Progress Notes (Signed)
  Subjective:    Patient ID: [name], female    DOB: 03-10-1941, 72 y.o.   MRN: [id number]  HPI Comments: She returns complaining of a 3 week history of pain, swelling, foul-smelling drainage from her legs (L>>R). She was doing well until she ran out of vasculera (due to the cost).     Review of Systems  Constitutional: Positive for fatigue. Negative for fever, chills, diaphoresis, activity change, appetite change and unexpected weight change.  HENT: Negative.   Eyes: Negative.   Respiratory: Positive for shortness of breath and wheezing. Negative for cough, choking, chest tightness and stridor.   Cardiovascular: Positive for palpitations and leg swelling. Negative for chest pain.  Gastrointestinal: Negative.  Negative for nausea, vomiting, abdominal pain, diarrhea, constipation and blood in stool.  Endocrine: Negative.   Genitourinary: Negative.   Musculoskeletal: Negative.   Skin: Positive for wound. Negative for color change, pallor and rash.  Allergic/Immunologic: Negative.   Neurological: Negative.   Hematological: Negative for adenopathy. Does not bruise/bleed easily.  Psychiatric/Behavioral: Negative.        Objective:   Physical Exam  Vitals reviewed. Constitutional: She is oriented to person, place, and time. She appears well-developed and well-nourished. No distress.  HENT:  Head: Normocephalic and atraumatic.  Nose: Nose normal.  Mouth/Throat: Oropharynx is clear and moist.  Eyes: Conjunctivae are normal. Right eye exhibits no discharge. Left eye exhibits no discharge. No scleral icterus.  Neck: Normal range of motion. Neck supple. No JVD present. No tracheal deviation present. No thyromegaly present.  Cardiovascular: Normal rate, regular rhythm, normal heart sounds and intact distal pulses.  Exam reveals no gallop and no friction rub.   No murmur heard. Pulmonary/Chest: Effort normal and breath sounds normal. No stridor. No respiratory distress. She has no  wheezes. She has no rales. She exhibits no tenderness.  Abdominal: Soft. Bowel sounds are normal. She exhibits no distension and no mass. There is no tenderness. There is no rebound and no guarding.  Musculoskeletal: Normal range of motion. She exhibits edema. She exhibits no tenderness.       Left lower leg: She exhibits swelling. She exhibits no tenderness, no bony tenderness, no edema, no deformity and no laceration.  Both LE's show thick, brawny induration with scale and erythema. On the LLE there is diffuse ulceration with foul-smelling exudate and erythema/warmth/streaking.  Lymphadenopathy:    She has no cervical adenopathy.  Neurological: She is oriented to person, place, and time.  Skin: Skin is warm and dry. No rash noted. She is not diaphoretic. There is erythema. No pallor.     Lab Results  Component Value Date   WBC 11.6* 03/20/2013   HGB 12.2 03/20/2013   HCT 39.6 03/20/2013   PLT 176 03/20/2013   GLUCOSE 137 03/20/2013   CHOL 183 02/05/2013   TRIG 156.0* 02/05/2013   HDL 44.00 02/05/2013   LDLDIRECT 135.3 11/20/2010   LDLCALC 108* 02/05/2013   ALT 12 03/20/2013   AST 16 03/20/2013   NA 142 03/20/2013   K 3.9 03/20/2013   CL 97 02/05/2013   CREATININE 1.1 03/20/2013   BUN 21.0 03/20/2013   CO2 31* 03/20/2013   TSH 3.30 02/05/2013   HGBA1C 6.4 02/05/2013   MICROALBUR 15.9* 02/05/2013       Assessment & Plan:

## 2013-06-01 NOTE — ED Provider Notes (Signed)
CSN: [id number]     Arrival date & time 06/01/13  1544 History   First MD Initiated Contact with Patient 06/01/13 1558     Chief Complaint  Patient presents with  . Leg Swelling   (Consider location/radiation/quality/duration/timing/severity/associated sxs/prior Treatment) HPI [name] is a 72 y.o. female who presents to emergency department complaining of increased swelling in drainage from bilateral lower legs for last 2 weeks. Patient states she has known peripheral vascular disease and has chronic leg swelling with some skin discoloration and chronic  right lower leg ulcer. States in the last 2 weeks both legs and feet became significantly more swollen, erythematous, states ulcer is draining more and is malodorous, states left lower leg is covered with crusty, greenish covered rash. States also began to have a bad odor about 4 days ago. Patient has history of bilateral leg cellulitis which grew Klebsiella 3 years ago where she was admitted and had IV antibiotics for 3 weeks. Patient states she went to her primary care doctor today who sent her here for admission and was concerned for similar infection. Patient denies any known fever at home, admits to chills. She denies any other symptoms, denies any cough, shortness of breath, chest pain, no injuries to legs.  Past Medical History  Diagnosis Date  . HTN (hypertension) 07/30/1988  . GERD (gastroesophageal reflux disease) 07/30/92  . COPD (chronic obstructive pulmonary disease) 01/27/98  . Hyperlipidemia 11/27/96  . CHF (congestive heart failure) 08/31/03  . Gout   . Renal insufficiency   . Kidney stone 1960, 1972, 1991  . Ulcerative colitis    No past surgical history on file. Family History  Problem Relation Age of Onset  . Stroke Mother   . Diabetes Mother   . Kidney cancer Mother   . Stomach cancer Mother   . Heart disease Mother   . Stroke Father   . Heart disease Sister     CATH,STENT  . Clotting disorder Sister   . Cervical  cancer Sister   . Heart disease Brother   . Colon cancer Neg Hx   . Heart disease Brother   . Crohn's disease Brother   . Lung cancer Sister   . Heart attack Sister   . Diabetes Sister    History  Substance Use Topics  . Smoking status: Former Research scientist (life sciences)  . Smokeless tobacco: Never Used     Comment: Quit 1998  . Alcohol Use: No   OB History   Grav Para Term Preterm Abortions TAB SAB Ect Mult Living                 Review of Systems  Constitutional: Positive for chills. Negative for fever and appetite change.  Respiratory: Negative for cough, chest tightness and shortness of breath.   Cardiovascular: Positive for leg swelling. Negative for chest pain and palpitations.  Gastrointestinal: Negative for nausea, vomiting, abdominal pain and diarrhea.  Genitourinary: Negative for dysuria, flank pain, vaginal bleeding, vaginal discharge, vaginal pain and pelvic pain.  Musculoskeletal: Negative for arthralgias, myalgias, neck pain and neck stiffness.  Skin: Positive for color change and wound.  Neurological: Negative for dizziness, weakness and headaches.  All other systems reviewed and are negative.    Allergies  Celebrex; Enalapril; Amlodipine besylate; Amoxicillin; Codeine sulfate; and Hydrocodone-acetaminophen  Home Medications   Current Outpatient Rx  Name  Route  Sig  Dispense  Refill  . albuterol (PROAIR HFA) 108 (90 BASE) MCG/ACT inhaler   Inhalation   Inhale 2 puffs  into the lungs 2 (two) times daily.   1 Inhaler   2   . allopurinol (ZYLOPRIM) 100 MG tablet      TAKE 1 TABLET BY MOUTH DAILY FOR GOUT   90 tablet   3   . aspirin 81 MG EC tablet   Oral   Take 81 mg by mouth daily.           Marland Kitchen BYSTOLIC 5 MG tablet      TAKE 1 TABLET BY MOUTH DAILY   90 tablet   3   . Cholecalciferol 50000 UNITS TABS   Oral   Take 1 tablet by mouth once a week.   12 tablet   3   . Dietary Management Product (VASCULERA) TABS   Oral   Take 1 capsule by mouth daily.   30  tablet   11   . furosemide (LASIX) 40 MG tablet      TAKE 1 TABLET BY MOUTH TWICE DAILY   180 tablet   2   . losartan (COZAAR) 100 MG tablet      TAKE 1 TABLET BY MOUTH EVERY DAY   90 tablet   3   . rosuvastatin (CRESTOR) 10 MG tablet   Oral   Take 1 tablet (10 mg total) by mouth daily.   90 tablet   3   . tiotropium (SPIRIVA) 18 MCG inhalation capsule   Inhalation   Place 18 mcg into inhaler and inhale daily.           Marland Kitchen EXPIRED: Fluticasone-Salmeterol (ADVAIR DISKUS) 250-50 MCG/DOSE AEPB   Inhalation   Inhale 1 puff into the lungs 2 (two) times daily.   1 each   0    BP 198/72  Pulse 64  Temp(Src) 98.1 F (36.7 C) (Oral)  Resp 18  SpO2 97% Physical Exam  Nursing note and vitals reviewed. Constitutional: She is oriented to person, place, and time. She appears well-developed and well-nourished. No distress.  HENT:  Head: Normocephalic.  Eyes: Conjunctivae are normal.  Neck: Neck supple.  Cardiovascular: Normal rate, regular rhythm and normal heart sounds.   Pulmonary/Chest: Effort normal and breath sounds normal. No respiratory distress. She has no wheezes. She has no rales.  Abdominal: Soft. Bowel sounds are normal. She exhibits no distension. There is no tenderness. There is no rebound.  Musculoskeletal: She exhibits edema.  Bilateral lower extremity edema, from knee down. Bilateral lower leg chronic vascular discoloration. There is increased erythematous skin discoloration to both right lower leg and left lower leg anteriorly. Areas are warm to the touch. There is a 3 x 3 cm ulceration to the right lower lateral shin with purulent drainage. There is a crusty and scaly rash to the left lower leg with greenish discoloration and drainage. Malodorous. Dorsal pedal pulses intact  Neurological: She is alert and oriented to person, place, and time.  Skin: Skin is warm and dry.  Psychiatric: She has a normal mood and affect. Her behavior is normal.    ED Course   Procedures (including critical care time) Labs Review Labs Reviewed  CBC WITH DIFFERENTIAL - Abnormal; Notable for the following:    WBC 11.5 (*)    Neutro Abs 8.0 (*)    Monocytes Absolute 1.1 (*)    All other components within normal limits  COMPREHENSIVE METABOLIC PANEL - Abnormal; Notable for the following:    CO2 35 (*)    Glucose, Bld 105 (*)    Creatinine, Ser 1.16 (*)  Albumin 3.4 (*)    GFR calc non Af Amer 46 (*)    GFR calc Af Amer 53 (*)    All other components within normal limits  WOUND CULTURE  WOUND CULTURE  URINALYSIS W MICROSCOPIC + REFLEX CULTURE  CG4 I-STAT (LACTIC ACID)   Imaging Review No results found.  EKG Interpretation   None       MDM   1. Bilateral lower leg cellulitis   2. Hypertension     Include bilateral lower leg swelling, erythema, drainage. She is afebrile nontoxic appearing. Exam suspicious for lower extremity cellulitis bilaterally with a right lower leg draining ulceration. I did get cultures of both legs. Patient allergic to penicillins, started on vancomycin and cefepime. Patient does have history of the same which grew Klebsiella 3 years ago, was treated with cefepime at that time. Patient is otherwise nontoxic, doubt sepsis. She's afebrile.  Spoke with hospitalist will admit for IV antibiotics and further workup  Filed Vitals:   06/01/13 1552  BP: 198/72  Pulse: 64  Temp: 98.1 F (36.7 C)  Resp: 18     Kajal Scalici A Omran Keelin, PA-C 06/01/13 1926

## 2013-06-01 NOTE — Progress Notes (Signed)
Discussed admission status with Dr. Gwendolyn Fill.

## 2013-06-01 NOTE — H&P (Signed)
[facility] History and Physical  [name]:[id number] DOB: 1941/01/05 DOA: 06/01/2013  Referring physician: Dr Zenia Resides PCP: [name], MD   Chief Complaint: bilateral lower extremity cellulitis with chronic non healing ulcer on right lower extremity  HPI: [name] is a 72 y.o. female with past medical history of chronic lower extremity edema and a chronic nonhealing ulcer on the right lower extremity since January of 2013 presented initially to her PCP with worsening swelling, tightness, redness and pain in the lower extremities and purulent drainage from the ulcer on the right leg.  She was advised by her PCP to come to the ED for IV antibiotics.  She does have a history of bilateral lower extremity cellulitis and infected left lower extremity ulcer in 2011 with pseudomonas which required hospitalization and prolonged treatment. She has been having chills, fatigue and sweats.  No measured fever.  No nausea or vomiting, appetite has been decreased.  She reports that she had been following with the wound care clinicfor the right lower extremity ulcer from 07/2011 until the wound healed over in the fall of 2013.  Two weeks after leaving the clinic the wound re-opened and she has been managing it at home.  Most recently she has been washing with warm water and dial soap and using honey on the wound.   Review of Systems:  Gen: as above HEENT: no eye pain, change in vision, throat pain, congestion, mout pain, lymphadenopathy CV: no chest pain, palpitations, syncope,  PULM: chronic shortness of breath unchanged GI: as above, no change in bowel habits, no melena, hematochesia GU: no dysuria or change in frequency NEURO: no confusion, altered mental status, focal defects SKIN: as above   Past Medical History  Diagnosis Date  . HTN (hypertension) 07/30/1988  . GERD (gastroesophageal reflux disease) 07/30/92  . COPD (chronic obstructive pulmonary disease) 01/27/98  .  Hyperlipidemia 11/27/96  . CHF (congestive heart failure) 08/31/03  . Gout   . Renal insufficiency   . Kidney stone 1960, 1972, 1991  . Ulcerative colitis    Past Surgical History  Procedure Laterality Date  . No past surgeries     Social History:  reports that she quit smoking about 17 years ago. She has never used smokeless tobacco. She reports that she does not drink alcohol or use illicit drugs. Lives at home with husband.  Does not use any equipment for ambulation. Performs ADLs.  Works 3d per week as a Scientist, water quality- standing.    Allergies  Allergen Reactions  . Celebrex [Celecoxib]     edema  . Enalapril Cough  . Amlodipine Besylate     REACTION: edema  . Amoxicillin     REACTION: Diarrhea  . Codeine Sulfate     REACTION: Nausea  . Hydrocodone-Acetaminophen     REACTION: Nausea    Family History  Problem Relation Age of Onset  . Stroke Mother   . Diabetes Mother   . Kidney cancer Mother   . Stomach cancer Mother   . Heart disease Mother   . Stroke Father   . Heart disease Sister     CATH,STENT  . Clotting disorder Sister   . Cervical cancer Sister   . Heart disease Brother   . Colon cancer Neg Hx   . Heart disease Brother   . Crohn's disease Brother   . Lung cancer Sister   . Heart attack Sister   . Diabetes Sister     Prior to Admission medications  Medication Sig Start Date End Date Taking? Authorizing Provider  albuterol (PROAIR HFA) 108 (90 BASE) MCG/ACT inhaler Inhale 2 puffs into the lungs 2 (two) times daily. 06/12/12  Yes Webb Silversmith, NP  allopurinol (ZYLOPRIM) 100 MG tablet TAKE 1 TABLET BY MOUTH DAILY FOR GOUT 05/14/13  Yes [name], MD  aspirin 81 MG EC tablet Take 81 mg by mouth daily.     Yes Historical Provider, MD  BYSTOLIC 5 MG tablet TAKE 1 TABLET BY MOUTH DAILY 09/23/12  Yes [name], MD  Cholecalciferol 50000 UNITS TABS Take 1 tablet by mouth once a week. 02/06/13  Yes [name], MD  Dietary Management Product (VASCULERA) TABS  Take 1 capsule by mouth daily. 02/05/13  Yes [name], MD  furosemide (LASIX) 40 MG tablet TAKE 1 TABLET BY MOUTH TWICE DAILY 03/11/13  Yes [name], MD  losartan (COZAAR) 100 MG tablet TAKE 1 TABLET BY MOUTH EVERY DAY 04/01/13  Yes [name], MD  rosuvastatin (CRESTOR) 10 MG tablet Take 1 tablet (10 mg total) by mouth daily. 02/19/13  Yes [name], MD  tiotropium (SPIRIVA) 18 MCG inhalation capsule Place 18 mcg into inhaler and inhale daily.     Yes Historical Provider, MD  Fluticasone-Salmeterol (ADVAIR DISKUS) 250-50 MCG/DOSE AEPB Inhale 1 puff into the lungs 2 (two) times daily. 04/24/11 02/05/13  [name], MD   Physical Exam: Filed Vitals:   06/01/13 1552  BP: 198/72  Pulse: 64  Temp: 98.1 F (36.7 C)  Resp: 18     General:  No distress, calm,   Eyes: PERRLA, EOMI, conjunctiva clear  ENT: mm dry, oropharynx clear, neck supple, no JVD, no thyromegaly or thyroid tenderness  Cardiovascular: rrr no mrg  Respiratory: diffuse wheezes and bibasilar crackles  Abdomen: obese, hernia in LLQ stable, BS normal, non tender  Skin: chronic venous stasis change over both lower extremities, skin thickened, indurated, erythematous over both shins and ankles, there is thick flaking skin over the left anterior LE, there is a 2cmx2cm ulcer on the lateral RLE with thick yellow drainage  Musculoskeletal: no swollen or tender joints  Psychiatric: calm, affect appropriate  Neurologic: CN's grossly intact, no focal defect  Labs on Admission:  Basic Metabolic Panel:  Recent Labs Lab 06/01/13 1640  NA 139  K 4.0  CL 98  CO2 35*  GLUCOSE 105*  BUN 18  CREATININE 1.16*  CALCIUM 10.0   Liver Function Tests:  Recent Labs Lab 06/01/13 1640  AST 16  ALT 9  ALKPHOS 90  BILITOT 0.5  PROT 8.1  ALBUMIN 3.4*   No results found for this basename: LIPASE, AMYLASE,  in the last 168 hours No results found for this basename: AMMONIA,  in the last 168  hours CBC:  Recent Labs Lab 06/01/13 1640  WBC 11.5*  NEUTROABS 8.0*  HGB 12.3  HCT 38.2  MCV 85.5  PLT 203   Cardiac Enzymes: No results found for this basename: CKTOTAL, CKMB, CKMBINDEX, TROPONINI,  in the last 168 hours  BNP (last 3 results) No results found for this basename: PROBNP,  in the last 8760 hours CBG: No results found for this basename: GLUCAP,  in the last 168 hours  Radiological Exams on Admission: No results found.   Assessment/Plan 1) bilateral lower extremity cellulitis with infected non healing ulcer on the right lower extremity:  Wound has been cultured, drainage from left LE has also been sent for culture.  Cefipime  started, will adjust based on culture results.  No signs of systemic infection.  2) lower extremity edema due to venous stasis: edema control will be important in treating cellulitis.  Will keep legs elevated.  Low sodium diet. Continue with home dose lasix.  3) COPD: does have diffuse wheezes on exam.  Check CXR. No increased respiratory effort and 02 sats are in high 80's to 90%.  Continue home regimen.  4) Congestive heart failure: ECHO 11/2011 shows EF 65%, LVH with diastolic dysfunction.  Low sodium diet. Daily weights. Check CXR. Continue lasix.  5) acute renal injury: Cr is up to 1.16 from baseline of 1.1.  Minor elevation.  Will need to monitor   6) Hypertension: blood pressure elevated on admission.  Continue with cozaar, bystolic, lasix.  Continue to monitor.  7) GERD: ppi  8) Gout: continue allopurinol.  9)  Monoclonal paraproteinemia: has been following with Dr. Alen Blew.  [facility].  Multiple myeloma is not suspected. Repeat evaluation planned for this spring   Code Status: FULL Family Communication: spoke with patient at bedside   Time spent: 60 minutes  [facility] Pager [phone]  If 7PM-7AM, please contact night-coverage [email or web address] Password [email or web address] 06/01/2013, 7:09 PM

## 2013-06-01 NOTE — Progress Notes (Signed)
ANTIBIOTIC CONSULT NOTE - INITIAL  Pharmacy Consult for Cefepime Indication: suspected RLE ulcer infection  Allergies  Allergen Reactions  . Celebrex [Celecoxib]     edema  . Enalapril Cough  . Amlodipine Besylate     REACTION: edema  . Amoxicillin     REACTION: Diarrhea  . Codeine Sulfate     REACTION: Nausea  . Hydrocodone-Acetaminophen     REACTION: Nausea    Patient Measurements:   Last documented weight 108kg and height 152 cm  Vital Signs: Temp: 98.1 F (36.7 C) (11/03 1552) Temp src: Oral (11/03 1552) BP: 198/72 mmHg (11/03 1552) Pulse Rate: 64 (11/03 1552)  Labs:  Recent Labs  06/01/13 1640  WBC 11.5*  HGB 12.3  PLT 203  CREATININE 1.16*    Microbiology: No results found for this or any previous visit (from the past 720 hour(s)).  Medical History: Past Medical History  Diagnosis Date  . HTN (hypertension) 07/30/1988  . GERD (gastroesophageal reflux disease) 07/30/92  . COPD (chronic obstructive pulmonary disease) 01/27/98  . Hyperlipidemia 11/27/96  . CHF (congestive heart failure) 08/31/03  . Gout   . Renal insufficiency   . Kidney stone 1960, 1972, 1991  . Ulcerative colitis     Medications:  Anti-infectives   Start     Dose/Rate Route Frequency Ordered Stop   06/01/13 1830  vancomycin (VANCOCIN) IVPB 1000 mg/200 mL premix     1,000 mg 200 mL/hr over 60 Minutes Intravenous  Once 06/01/13 1802       Assessment: 72 yo F presents to [facility] ED from outpatient physicians office with concern for B/L LE edema and erythema, ulceration on RLE shin, and rash with discharge on LLE.  Pharmacy is consulted to dose Cefepime.  Vancomycin x1 dose only in ED (total loading dose of 2g)  Tmax: afebrile  WBC: 11.5  SCr 1.16 with CrCl ~ 73 ml/min CG, ~ 58 ml/min Normalized  Cultures pending  Goal of Therapy:  Appropriate abx dosing, eradication of infection.   Plan:   Cefepime 1g IV q8h.  Follow up renal fxn and culture results.    Gretta Arab  PharmD, BCPS Pager [phone] 06/01/2013 6:23 PM

## 2013-06-01 NOTE — ED Provider Notes (Signed)
Medical screening examination/treatment/procedure(s) were performed by non-physician practitioner and as supervising physician I was immediately available for consultation/collaboration.   Leota Jacobsen, MD 06/01/13 [phone]

## 2013-06-01 NOTE — Assessment & Plan Note (Signed)
She has a recurrence of cellulitis, previously this was klebsiella that required inpt IV antibiotics Today I have recommended that she be admitted to elevated her legs and to start IV antibiotics

## 2013-06-01 NOTE — Progress Notes (Signed)
Utilization Review completed.  Zafiro Routson RN CM  

## 2013-06-02 DIAGNOSIS — I10 Essential (primary) hypertension: Secondary | ICD-10-CM

## 2013-06-02 DIAGNOSIS — I872 Venous insufficiency (chronic) (peripheral): Secondary | ICD-10-CM

## 2013-06-02 DIAGNOSIS — D472 Monoclonal gammopathy: Secondary | ICD-10-CM

## 2013-06-02 DIAGNOSIS — I517 Cardiomegaly: Secondary | ICD-10-CM

## 2013-06-02 DIAGNOSIS — I5033 Acute on chronic diastolic (congestive) heart failure: Secondary | ICD-10-CM

## 2013-06-02 DIAGNOSIS — E669 Obesity, unspecified: Secondary | ICD-10-CM

## 2013-06-02 LAB — CBC
HCT: 37 % (ref 36.0–46.0)
Hemoglobin: 11.6 g/dL — ABNORMAL LOW (ref 12.0–15.0)
MCHC: 31.4 g/dL (ref 30.0–36.0)
MCV: 86.7 fL (ref 78.0–100.0)
Platelets: 180 10*3/uL (ref 150–400)
RDW: 15.6 % — ABNORMAL HIGH (ref 11.5–15.5)
WBC: 11 10*3/uL — ABNORMAL HIGH (ref 4.0–10.5)

## 2013-06-02 LAB — GLUCOSE, CAPILLARY

## 2013-06-02 LAB — BASIC METABOLIC PANEL
BUN: 19 mg/dL (ref 6–23)
CO2: 33 mEq/L — ABNORMAL HIGH (ref 19–32)
Calcium: 9.4 mg/dL (ref 8.4–10.5)
Chloride: 100 mEq/L (ref 96–112)
Creatinine, Ser: 1.27 mg/dL — ABNORMAL HIGH (ref 0.50–1.10)
Glucose, Bld: 104 mg/dL — ABNORMAL HIGH (ref 70–99)
Potassium: 3.8 mEq/L (ref 3.5–5.1)

## 2013-06-02 LAB — PRO B NATRIURETIC PEPTIDE: Pro B Natriuretic peptide (BNP): 1564 pg/mL — ABNORMAL HIGH (ref 0–125)

## 2013-06-02 MED ORDER — FUROSEMIDE 10 MG/ML IJ SOLN
40.0000 mg | Freq: Two times a day (BID) | INTRAMUSCULAR | Status: DC
  Administered 2013-06-02 – 2013-06-03 (×2): 40 mg via INTRAVENOUS
  Filled 2013-06-02 (×4): qty 4

## 2013-06-02 MED ORDER — DEXTROSE 5 % IV SOLN
2.0000 g | INTRAVENOUS | Status: DC
  Filled 2013-06-02: qty 2

## 2013-06-02 NOTE — Progress Notes (Signed)
Pharmacy - Brief Note  72 yo F presents to [facility] ED from outpatient physicians office with concern for B/L LE edema and erythema, ulceration on RLE shin, and rash with discharge on LLE. Pharmacy is consulted to dose Cefepime. PA thinks that w/ hx of klebsiella, pt might not need to continue on Vancomycin, vancomycin not ordered at admission  11/3 >> Vanc (1g+1g) in ED (no per pharmacy dosing, just loading dose for now) 11/3 >> Cefepime >>   Tmax: afebrile WBC: 11 SCr 1.27 with CrCl ~ 45 ml/min Normalized/CG  11/3: wound: pending  Plan:  Based on estimated CrCl and body weight, change cefepime to 2gm IV q24h  Continue to monitor renal function  Doreene Eland, PharmD, BCPS.   Pager: [phone] 06/02/2013 7:44 AM

## 2013-06-02 NOTE — Progress Notes (Signed)
Nutrition Brief Note  Patient identified on the Malnutrition Screening Tool (MST) Report  Wt Readings from Last 5 Encounters:  06/02/13 235 lb 6.2 oz (106.77 kg)  06/01/13 238 lb (107.956 kg)  04/08/13 246 lb 14.4 oz (111.993 kg)  03/20/13 243 lb 6.4 oz (110.406 kg)  02/19/13 242 lb 12 oz (110.111 kg)    Body mass index is 46.21 kg/(m^2). Patient meets criteria for class III extreme obesity based on current BMI.   Current diet order is low sodium, patient is consuming approximately 100% of meals at this time. Labs and medications reviewed. Admitted with bilateral lower extremity cellulitis with chronic non healing ulcer on right lower extremity. Met with pt who reports poor intake for the past few days, eating just 1 meal/day, unsure of any changes in weight. Follows a low sodium diet at home. Ate 100% of breakfast and denies any nutritional concerns. Reports appetite improving.   No nutrition interventions warranted at this time. If nutrition issues arise, please consult RD.   Mikey College MS, [place], [place] Pager [phone] After Hours Pager

## 2013-06-02 NOTE — Progress Notes (Addendum)
[facility] PROGRESS NOTE  [name] ZES:[id number] DOB: 05-03-1941 DOA: 06/01/2013 PCP: [name], MD  Brief history 72 y.o. female with past medical history of chronic lower extremity edema and a chronic nonhealing ulcer on the right lower extremity since January of 2013 presented initially to her PCP with worsening swelling, tightness, redness and pain in the lower extremities and purulent drainage from the ulcer on the right leg. She was advised by her PCP to come to the ED for IV antibiotics.  However, after further evaluation questioning the patient has been complaining of progressive dyspnea for one month, worse in the past week. She also complained of PND and orthopnea for the past week with increasing abdominal girth and lower extremity edema. As a result, she has noted increasing weeping in her bilateral lower extremities with increasing discomfort and discoloration.  Assessment/Plan: Acute on chronic diastolic CHF -Start furosemide 40 mg IV every 12 hours -Daily weights -Fluid restriction -06/02/2013 echo shows EF 55-60%, trivial TR -Strict I.'s and O.'s -check proBNP Lower extremity pain and edema -Suspect a combination of venous stasis dermatitis as well as CHF contributing to the patient's increasing pain and edema -Venous duplex rule out DVT -Discontinue antibiotics and observe -Wound cultures unreliable as they are surface cultures -Expect improvement in the edema and pain with treatment of CHF Venous stasis dermatitis -Has been a chronic problem for the patient -responsible for lichenification changes  Hypertension -Continue losartan and bystolic CKD stage III -Baseline creatinine 1.0-1.3 -Monitor closely on diuretics COPD -Stable -Continue long-acting beta agonist and Spiriva MGUS -stable -MedOnc notified of patient's admission Family Communication:   Husband at beside Disposition Plan:   Home when medically stable  Antibiotics:  Cefepime   06/01/13-->06/02/13  vanc 06/01/13      Procedures/Studies: Dg Chest 2 View  06/01/2013   CLINICAL DATA:  73 year old female with wheezing and shortness of breath. Initial encounter.  EXAM: CHEST  2 VIEW  COMPARISON:  06/12/2012 and earlier.  FINDINGS: Stable cardiomegaly and mediastinal contours. Large body habitus. Stable lung volumes. No pneumothorax. Increased left basilar opacity, but no definite pleural effusion on the lateral view. No pneumothorax or edema. Similar appearance of streaky bibasilar opacities and. No consolidation identified. Visualized tracheal air column is within normal limits. No acute osseous abnormality identified.  IMPRESSION: Suggestion of small left pleural effusion. Otherwise stable streaky bibasilar opacities favored to reflect chronic atelectasis.   Electronically Signed   By: Lars Pinks M.D.   On: 06/01/2013 23:28         Subjective: Patient complains of orthopnea and PND as well as increasing dyspnea on exertion. Denies any chest pain, nausea, vomiting, diarrhea, fevers, chills. Denies any hemoptysis. Has a cough with occasional white sputum  Objective: Filed Vitals:   06/02/13 0300 06/02/13 0700 06/02/13 0742 06/02/13 1328  BP: 188/68   177/76  Pulse: 68   60  Temp: 98.1 F (36.7 C)   97.6 F (36.4 C)  TempSrc: Oral   Oral  Resp: 18   20  Height:  4' 11.84" (1.52 m)    Weight: 106.77 kg (235 lb 6.2 oz)     SpO2: 92%  96% 97%    Intake/Output Summary (Last 24 hours) at 06/02/13 1815 Last data filed at 06/02/13 1802  Gross per 24 hour  Intake    560 ml  Output    350 ml  Net    210 ml   Weight change:  Exam:   General:  Pt is  alert, follows commands appropriately, not in acute distress  HEENT: No icterus, No thrush,  [place]/AT  Cardiovascular: RRR, S1/S2, no rubs, no gallops  Respiratory: bibasilar crackles. No wheezing. Good Air movement.   Abdomen: Soft/+BS, non tender, non distended, no guarding  Extremities: 2+ edema bilateral  lower extremities with lichenification of the bilateral pretibial areas. Approximate 6 cm venous stasis ulcer on the right pretibial area without any purulent drainage. No necrosis or pus. No crepitance.  Data Reviewed: Basic Metabolic Panel:  Recent Labs Lab 06/01/13 1640 06/02/13 0430  NA 139 139  K 4.0 3.8  CL 98 100  CO2 35* 33*  GLUCOSE 105* 104*  BUN 18 19  CREATININE 1.16* 1.27*  CALCIUM 10.0 9.4   Liver Function Tests:  Recent Labs Lab 06/01/13 1640  AST 16  ALT 9  ALKPHOS 90  BILITOT 0.5  PROT 8.1  ALBUMIN 3.4*   No results found for this basename: LIPASE, AMYLASE,  in the last 168 hours No results found for this basename: AMMONIA,  in the last 168 hours CBC:  Recent Labs Lab 06/01/13 1640 06/02/13 0430  WBC 11.5* 11.0*  NEUTROABS 8.0*  --   HGB 12.3 11.6*  HCT 38.2 37.0  MCV 85.5 86.7  PLT 203 180   Cardiac Enzymes: No results found for this basename: CKTOTAL, CKMB, CKMBINDEX, TROPONINI,  in the last 168 hours BNP: No components found with this basename: POCBNP,  CBG:  Recent Labs Lab 06/02/13 0754  GLUCAP 108*    Recent Results (from the past 240 hour(s))  WOUND CULTURE     Status: None   Collection Time    06/01/13  6:05 PM      Result Value Range Status   Specimen Description LEG   Final   Special Requests Normal   Final   Gram Stain     Final   Value: NO WBC SEEN     NO SQUAMOUS EPITHELIAL CELLS SEEN     NO ORGANISMS SEEN     Performed at [facility]   Culture PENDING   Incomplete   Report Status PENDING   Incomplete  WOUND CULTURE     Status: None   Collection Time    06/01/13  6:05 PM      Result Value Range Status   Specimen Description LEG   Final   Special Requests NONE   Final   Gram Stain     Final   Value: ABUNDANT WBC PRESENT, PREDOMINANTLY PMN     NO SQUAMOUS EPITHELIAL CELLS SEEN     NO ORGANISMS SEEN     Performed at [facility]   Culture PENDING   Incomplete   Report Status PENDING    Incomplete     Scheduled Meds: . albuterol  2 puff Inhalation BID  . allopurinol  100 mg Oral Daily  . aspirin EC  81 mg Oral Daily  . atorvastatin  10 mg Oral q1800  . furosemide  40 mg Intravenous BID  . heparin  5,000 Units Subcutaneous Q8H  . losartan  100 mg Oral Daily  . mometasone-formoterol  2 puff Inhalation BID  . nebivolol  5 mg Oral Daily  . tiotropium  18 mcg Inhalation Daily   Continuous Infusions:    Alenna Russell, DO  [facility] Pager [phone]  If 7PM-7AM, please contact night-coverage [email or web address] Password [email or web address] 06/02/2013, 6:15 PM   LOS: 1 day

## 2013-06-02 NOTE — Progress Notes (Signed)
Echocardiogram 2D Echocardiogram has been performed.  Joelene Millin 06/02/2013, 10:17 AM

## 2013-06-03 ENCOUNTER — Inpatient Hospital Stay (HOSPITAL_COMMUNITY): Payer: Medicare Other

## 2013-06-03 LAB — GLUCOSE, CAPILLARY: Glucose-Capillary: 104 mg/dL — ABNORMAL HIGH (ref 70–99)

## 2013-06-03 LAB — CBC
HCT: 37 % (ref 36.0–46.0)
MCHC: 30.5 g/dL (ref 30.0–36.0)
RDW: 15.7 % — ABNORMAL HIGH (ref 11.5–15.5)

## 2013-06-03 LAB — BASIC METABOLIC PANEL
BUN: 22 mg/dL (ref 6–23)
Calcium: 9.5 mg/dL (ref 8.4–10.5)
Chloride: 97 mEq/L (ref 96–112)
Creatinine, Ser: 1.33 mg/dL — ABNORMAL HIGH (ref 0.50–1.10)
GFR calc Af Amer: 45 mL/min — ABNORMAL LOW (ref >90)
GFR calc non Af Amer: 39 mL/min — ABNORMAL LOW (ref >90)
Glucose, Bld: 109 mg/dL — ABNORMAL HIGH (ref 70–99)
Potassium: 4 mEq/L (ref 3.5–5.1)

## 2013-06-03 LAB — MAGNESIUM: Magnesium: 2 mg/dL (ref 1.5–2.5)

## 2013-06-03 LAB — BLOOD GAS, ARTERIAL
Acid-Base Excess: 8.4 mmol/L — ABNORMAL HIGH (ref 0.0–2.0)
Patient temperature: 98.6
TCO2: 32.2 mmol/L (ref 0–100)
pCO2 arterial: 61.2 mmHg (ref 35.0–45.0)
pO2, Arterial: 67.3 mmHg — ABNORMAL LOW (ref 80.0–100.0)

## 2013-06-03 MED ORDER — FUROSEMIDE 40 MG PO TABS
40.0000 mg | ORAL_TABLET | Freq: Every day | ORAL | Status: DC
  Administered 2013-06-03 – 2013-06-05 (×3): 40 mg via ORAL
  Filled 2013-06-03 (×3): qty 1

## 2013-06-03 MED ORDER — HYDROCERIN EX CREA
TOPICAL_CREAM | Freq: Every day | CUTANEOUS | Status: DC
  Administered 2013-06-04 – 2013-06-05 (×2): via TOPICAL
  Filled 2013-06-03: qty 113

## 2013-06-03 NOTE — Evaluation (Addendum)
Occupational Therapy Evaluation Patient Details Name: [name] MRN: [id number] DOB: 1941-04-21 Today's Date: 06/03/2013 Time: 1203-1225 OT Time Calculation (min): 22 min  OT Assessment / Plan / Recommendation History of present illness Acute CHF exacerbation, SOB, bilateral lower extremity cellulitis with chronic non healing ulcer on right lower extremity   Clinical Impression   Pt presents w/ dx as above and currently demonstrates deficits in ability to I'ly perform ADL tasks. She presents w/ generalized weakness and should benefit from acute OT for pt /family education, energy conservation tech's, a/e and increasing activity tolerance prior to anticipated d/c home w/ family intermittent assist.    OT Assessment  Patient needs continued OT Services    Follow Up Recommendations  Home health OT;Supervision - Intermittent    Barriers to Discharge      Equipment Recommendations  Other (comment) (Pt husband attempting to get 3:1 & RW per his report)    Recommendations for Other Services    Frequency  Min 2X/week    Precautions / Restrictions Precautions Precautions: Fall;Other (comment) (Elevate legs) Restrictions Weight Bearing Restrictions: No   Pertinent Vitals/Pain Pt reports SOB with activity. Pt was educated in breathing tech's, repositioned and rest recommended. Pt has call bell & was educated to use PRN for RN if needed. She verbalized understanding of this.    ADL  Eating/Feeding: Performed;Independent Where Assessed - Eating/Feeding: Chair Grooming: Performed;Wash/dry hands;Min guard (Standing at sink in bathroom) Where Assessed - Grooming: Supported standing Upper Body Bathing: Simulated;Set up Where Assessed - Upper Body Bathing: Supported sitting Lower Body Bathing: Simulated;Moderate assistance Where Assessed - Lower Body Bathing: Supported sit to stand Upper Body Dressing: Simulated;Set up Where Assessed - Upper Body Dressing: Supported  sitting;Unsupported sitting Lower Body Dressing: Simulated;Moderate assistance Where Assessed - Lower Body Dressing: Supported sit to stand Toilet Transfer: Performed;Minimal Print production planner Method: Sit to Loss adjuster, chartered: Comfort height toilet;Grab bars;Other (comment) (RW) Toileting - Clothing Manipulation and Hygiene: Performed;Minimal assistance Where Assessed - Best boy and Hygiene: Standing Tub/Shower Transfer Method: Not assessed Equipment Used: Gait belt;Rolling walker Transfers/Ambulation Related to ADLs: Pt was Min A for transfer from chair to toilet in bathroom using RW & grab bar. Pt required vc's for safety, sequencing & hand placement w/ RW as well as for breathing techniques. ADL Comments: Pt/family were educated in role of OT. Pt/family were educated in benefit of grab bars in bathroom, husband states he plans to have them installed this week & also discussed possible use of 3:1. Husband states he may have access to this as well as RW through work. Pt participated in ADL tasks & transfers, she presents w/ generalized weakness and should benefit from acute OT for pt /family education, energy conservation tech's and increasing activity tolerance.    OT Diagnosis: Generalized weakness;Acute pain  OT Problem List: Decreased strength;Decreased activity tolerance;Decreased knowledge of precautions;Decreased knowledge of use of DME or AE;Cardiopulmonary status limiting activity;Pain OT Treatment Interventions: Self-care/ADL training;Energy conservation;DME and/or AE instruction;Patient/family education;Therapeutic activities   OT Goals(Current goals can be found in the care plan section) Acute Rehab OT Goals Patient Stated Goal: Get well, go home and back to work Time For Goal Achievement: 06/17/13 Potential to Achieve Goals: Good  Visit Information  Last OT Received On: 06/03/13 Assistance Needed: +1 History of Present Illness:  Acute CHF exacerbation, SOB, bilateral lower extremity cellulitis with chronic non healing ulcer on right lower extremity       Prior [place]  Family/patient expects to be discharged to:: Private residence Living Arrangements: Spouse/significant other Available Help at Discharge: Family Type of Home: House Home Access: Stairs to enter Technical brewer of Steps: 1 [place]: One level Additional Comments: Pt's husband states that he is planning to have grab installed in bathroom/tub/shower this week, states he has RW "@ work" & may have access to 3:1 Prior Function Level of Independence: Independent Comments: Pt states that she works at [employer] and that she recently has had to take multiple rest breaks just walking from car to store, no AD. Communication Communication: No difficulties Dominant Hand: Right    Vision/Perception Vision - History Baseline Vision: Other (comment) (Reading or at work) Environmental health practitioner History: Cataracts (Cataracts not surgically removed at this time)   Cognition  Cognition Arousal/Alertness: Awake/alert Behavior During Therapy: WFL for tasks assessed/performed Overall Cognitive Status: Within Functional Limits for tasks assessed    Extremity/Trunk Assessment Upper Extremity Assessment Upper Extremity Assessment: Generalized weakness Lower Extremity Assessment Lower Extremity Assessment: Defer to PT evaluation Cervical / Trunk Assessment Cervical / Trunk Assessment: Normal    Mobility Bed Mobility Bed Mobility: Not assessed Details for Bed Mobility Assistance: Pt received up in chair today. Transfers Transfers: Sit to Stand;Stand to Sit Sit to Stand: 4: Min guard;4: Min assist;From chair/3-in-1;From toilet;With upper extremity assist;With armrests Stand to Sit: 4: Min assist;4: Min guard;With upper extremity assist;With armrests;To chair/3-in-1;To toilet Details for Transfer Assistance: VC's for  safety, sequencing and hand placement as well as controlled decent.        Balance Balance Balance Assessed: Yes Static Sitting Balance Static Sitting - Balance Support: Bilateral upper extremity supported;Feet supported Static Standing Balance Static Standing - Balance Support: Bilateral upper extremity supported   End of Session OT - End of Session Equipment Utilized During Treatment: Gait belt;Rolling walker Activity Tolerance: Patient tolerated treatment well Patient left: in chair;with call bell/phone within reach;with family/visitor present;with nursing/sitter in room Nurse Communication: Mobility status  GO     Almyra Deforest 06/03/2013, 12:51 PM

## 2013-06-03 NOTE — Consult Note (Signed)
WOC wound consult note Reason for Consult: Consult requested for bilat legs.  Pt states she has had non-healing wounds in the past and was followed by the outpatient wound care center,  They eventually healed but re-occurred last week. She has generalized edema and erythremia to BLE and dry, scaly skin.   Wound type: Partial thickness stasis ulcers to BLE.   Measurement:Right leg 2.5X2cm 100% dry dark red scab Left leg 8X6cm100% dry dark red scab Both sites without open wounds or drainage at present.  Previously with mod amt yellow drainage, pt states. Dressing procedure/placement/frequency: Wash legs Q day and aggressively towel dry to assist with removal of non-viable skin.  Apply Eucerin to promote moist healing.  Foam dressing if open areas evolve as scabs are removed.  Please re-consult if further assistance is needed.  Thank-you,  Julien Girt MSN, [place], [place], [place], [place]

## 2013-06-03 NOTE — Progress Notes (Signed)
[facility] PROGRESS NOTE  [name]:[id number] DOB: 07/01/1941 DOA: 06/01/2013 PCP: [name], MD  Assessment/Plan: Acute on Chronic Diastolic CHF -Volume status improved. -Doubt accuracy of Is and Os. -Will transition over to PO lasix today at home dose.  Bilateral Venous Stasis Dermatitis -Chronic. -Weeping improved with decreasing edema.  Hypoxemia -74% on RA. -Check CXR and ABG. -May need CT Chest depending on above results.  HTN -Well controlled. -Continue home medications.  CKD Stage III -At baseline.  COPD -No wheezes on lung auscultation. -No active  MGUS -Continue OP follow up as scheduled with Onc.  Code Status: Full Code Family Communication: Patient only  Disposition Plan: To be determined. PT/OT consultations requested.   Consultants:  None   Antibiotics:  None   Subjective: Feels weak.  Objective: Filed Vitals:   06/02/13 2102 06/02/13 2117 06/03/13 0512 06/03/13 0835  BP:  131/63 153/64   Pulse:  66 93   Temp:  98.4 F (36.9 C) 98.3 F (36.8 C)   TempSrc:  Oral Oral   Resp:  20 20   Height:      Weight:   107.004 kg (235 lb 14.4 oz)   SpO2: 95% 94% 95% 92%    Intake/Output Summary (Last 24 hours) at 06/03/13 0926 Last data filed at 06/02/13 1802  Gross per 24 hour  Intake    360 ml  Output      0 ml  Net    360 ml   Filed Weights   06/01/13 2012 06/02/13 0300 06/03/13 0512  Weight: 106.867 kg (235 lb 9.6 oz) 106.77 kg (235 lb 6.2 oz) 107.004 kg (235 lb 14.4 oz)    Exam:   General:  AA Ox3  Cardiovascular: RRR  Respiratory: CTA B  Abdomen: Obese/S/NT/ND/+BS  Extremities: Bilateral venous stasis ulceration. 1+edema bilateral.   Neurologic:  Non-focal.  Data Reviewed: Basic Metabolic Panel:  Recent Labs Lab 06/01/13 1640 06/02/13 0430 06/03/13 0515  NA 139 139 139  K 4.0 3.8 4.0  CL 98 100 97  CO2 35* 33* 35*  GLUCOSE 105* 104* 109*  BUN 18 19 22   CREATININE 1.16* 1.27* 1.33*   CALCIUM 10.0 9.4 9.5  MG  --   --  2.0   Liver Function Tests:  Recent Labs Lab 06/01/13 1640  AST 16  ALT 9  ALKPHOS 90  BILITOT 0.5  PROT 8.1  ALBUMIN 3.4*   No results found for this basename: LIPASE, AMYLASE,  in the last 168 hours No results found for this basename: AMMONIA,  in the last 168 hours CBC:  Recent Labs Lab 06/01/13 1640 06/02/13 0430 06/03/13 0515  WBC 11.5* 11.0* 10.1  NEUTROABS 8.0*  --   --   HGB 12.3 11.6* 11.3*  HCT 38.2 37.0 37.0  MCV 85.5 86.7 87.5  PLT 203 180 177   Cardiac Enzymes: No results found for this basename: CKTOTAL, CKMB, CKMBINDEX, TROPONINI,  in the last 168 hours BNP (last 3 results)  Recent Labs  06/02/13 0430  PROBNP 1564.0*   CBG:  Recent Labs Lab 06/02/13 0754 06/03/13 0749  GLUCAP 108* 104*    Recent Results (from the past 240 hour(s))  WOUND CULTURE     Status: None   Collection Time    06/01/13  6:05 PM      Result Value Range Status   Specimen Description LEG   Final   Special Requests Normal   Final   Gram Stain     Final  Value: NO WBC SEEN     NO SQUAMOUS EPITHELIAL CELLS SEEN     NO ORGANISMS SEEN     Performed at [facility]   Culture     Final   Value: Culture reincubated for better growth     Performed at [facility]   Report Status PENDING   Incomplete  WOUND CULTURE     Status: None   Collection Time    06/01/13  6:05 PM      Result Value Range Status   Specimen Description LEG   Final   Special Requests NONE   Final   Gram Stain     Final   Value: ABUNDANT WBC PRESENT, PREDOMINANTLY PMN     NO SQUAMOUS EPITHELIAL CELLS SEEN     NO ORGANISMS SEEN     Performed at [facility]   Culture     Final   Value: Culture reincubated for better growth     Performed at [facility]   Report Status PENDING   Incomplete     Studies: Dg Chest 2 View  06/01/2013   CLINICAL DATA:  72 year old female with wheezing and shortness of breath. Initial encounter.   EXAM: CHEST  2 VIEW  COMPARISON:  06/12/2012 and earlier.  FINDINGS: Stable cardiomegaly and mediastinal contours. Large body habitus. Stable lung volumes. No pneumothorax. Increased left basilar opacity, but no definite pleural effusion on the lateral view. No pneumothorax or edema. Similar appearance of streaky bibasilar opacities and. No consolidation identified. Visualized tracheal air column is within normal limits. No acute osseous abnormality identified.  IMPRESSION: Suggestion of small left pleural effusion. Otherwise stable streaky bibasilar opacities favored to reflect chronic atelectasis.   Electronically Signed   By: Lars Pinks M.D.   On: 06/01/2013 23:28    Scheduled Meds: . albuterol  2 puff Inhalation BID  . allopurinol  100 mg Oral Daily  . aspirin EC  81 mg Oral Daily  . atorvastatin  10 mg Oral q1800  . furosemide  40 mg Oral Daily  . heparin  5,000 Units Subcutaneous Q8H  . losartan  100 mg Oral Daily  . mometasone-formoterol  2 puff Inhalation BID  . nebivolol  5 mg Oral Daily  . tiotropium  18 mcg Inhalation Daily   Continuous Infusions:   Principal Problem:   Cellulitis and abscess of leg Active Problems:   Acute on chronic diastolic CHF (congestive heart failure)   B12 DEFICIENCY   Hyperlipidemia LDL goal < 100   GOUT   Obesity, unspecified   HYPERTENSION   COPD   GERD   ULCERATIVE COLITIS-LEFT SIDE   RENAL INSUFFICIENCY   Edema   Ulcer of leg, chronic, right   Monoclonal paraproteinemia   Venous stasis dermatitis    Time spent: 35 minutes.    Lelon Frohlich  [facility] Pager [phone]  If 7PM-7AM, please contact night-coverage at [email or web address], password [email or web address] 06/03/2013, 9:26 AM  LOS: 2 days

## 2013-06-04 ENCOUNTER — Encounter (HOSPITAL_COMMUNITY): Payer: Self-pay

## 2013-06-04 ENCOUNTER — Inpatient Hospital Stay (HOSPITAL_COMMUNITY): Payer: Medicare Other

## 2013-06-04 DIAGNOSIS — J9620 Acute and chronic respiratory failure, unspecified whether with hypoxia or hypercapnia: Secondary | ICD-10-CM

## 2013-06-04 LAB — BASIC METABOLIC PANEL
CO2: 37 mEq/L — ABNORMAL HIGH (ref 19–32)
Calcium: 9.4 mg/dL (ref 8.4–10.5)
Creatinine, Ser: 1.13 mg/dL — ABNORMAL HIGH (ref 0.50–1.10)
Glucose, Bld: 99 mg/dL (ref 70–99)
Potassium: 4 mEq/L (ref 3.5–5.1)
Sodium: 139 mEq/L (ref 135–145)

## 2013-06-04 LAB — WOUND CULTURE: Gram Stain: NONE SEEN

## 2013-06-04 LAB — CBC
HCT: 36.7 % (ref 36.0–46.0)
Hemoglobin: 11 g/dL — ABNORMAL LOW (ref 12.0–15.0)
MCH: 26 pg (ref 26.0–34.0)
MCV: 86.8 fL (ref 78.0–100.0)
Platelets: 175 10*3/uL (ref 150–400)
RBC: 4.23 MIL/uL (ref 3.87–5.11)

## 2013-06-04 MED ORDER — TECHNETIUM TO 99M ALBUMIN AGGREGATED
5.9000 | Freq: Once | INTRAVENOUS | Status: AC | PRN
  Administered 2013-06-04: 5.9 via INTRAVENOUS

## 2013-06-04 MED ORDER — TECHNETIUM TC 99M DIETHYLENETRIAME-PENTAACETIC ACID: 44.0000 | Freq: Once | INTRAVENOUS | Status: AC | PRN

## 2013-06-04 NOTE — Clinical Documentation Improvement (Signed)
Possible Clinical Conditions?  Acute Respiratory Failure Acute on Chronic Respiratory Failure Chronic Respiratory Failure Acute Respiratory Insufficiency Acute Respiratory Insufficiency following surgery or trauma Other Condition Cannot Clinically Determine   Supporting Information: Risk Factors: HTN, CHF, COPD, acute renal injury  Signs & Symptoms: H&P: "PULM: chronic shortness of breath unchanged//diffuse wheezes and bibasilar crackles//02 sats are in high 80's to 90%. Continue home regimen NOTE:Acute on chronic diastolic CHF  -Start furosemide 40 mg IV every 12 hours  -Daily weights  -Fluid restriction  -06/02/2013 echo shows EF 55-60%, trivial TR  -Strict I.'s and O.'s   (BNP) 0 - 125 pg/mL  1564.0 (H)     Diagnostics:CHEST 2 VIEW 06/03/13  CLINICAL DATA: Hypoxemia, history of COPD FINDINGS:  There is now a small left pleural effusion present with left basilar  atelectasis. Pneumonia posteriorly at the left lung base cannot be  excluded. Cardiomegaly is stable and there may be a mild degree of  pulmonary vascular congestion present. No bony abnormality is seen.  IMPRESSION:  Small left pleural effusion with left basilar atelectasis. Cannot  exclude left basilar pneumonia. Also question mild pulmonary  vascular congestion.  Component     Latest Ref Rng 06/09/2010 06/03/2013  pH, Arterial     7.350 - 7.450  7.374  pCO2 arterial     35.0 - 45.0 mmHg  61.2 (HH)  pO2, Arterial     80.0 - 100.0 mmHg  67.3 (L)  Bicarbonate     20.0 - 24.0 mEq/L  34.9 (H)  TCO2     0 - 100 mmol/L 30 32.2  Acid-Base Excess     0.0 - 2.0 mmol/L  8.4 (H)    Treatment:   O2 @2  [place]  sats 94%  albuterol inhaler SPIRIVA inhaler    Thank Glendora Score ,RN Clinical Documentation Specialist:  [facility] [employer]

## 2013-06-04 NOTE — Progress Notes (Signed)
Patient evaluated for [facility] [facility] services. She is currently off the unit for a procedure. Will come back at later time. Spoke with inpatient RNCM to make aware that attempts are being made to engage patient for [place] [facility] services.  Marthenia Rolling, MSN-Ed, RN, BSN- [facility] [email or web address]-[phone]

## 2013-06-04 NOTE — Progress Notes (Signed)
[facility] PROGRESS NOTE  [name] IRC:[id number] DOB: [birth year] DOA: 06/01/2013 PCP: [name], MD  Assessment/Plan:  Acute on Chronic Respiratory Failure -Believed to be multifactorial: COPD, CHF, MO, probably some OSA. -See below for details. -Have been unable to wean off oxygen, so will discuss with CM the need for home oxygen. -Will need to have OP sleep study arranged as well.   Acute on Chronic Diastolic CHF -Volume status improved. -Doubt accuracy of Is and Os. -Lasix is now PO at home dose.  Bilateral Venous Stasis Dermatitis -Chronic. -Weeping improved with decreasing edema.  Hypoxemia -74% on RA. -ABG shows hypoxemia and compensated hypercarbia which points towards her being a chronic Co2 retainer. -VQ scan low probability for PE. -Will need oxygen on DC.  HTN -Well controlled. -Continue home medications.  CKD Stage III -At baseline.  COPD -No wheezes on lung auscultation. -Not active. -Will need oxygen given hypoxemia and pulmonary follow up on DC.  MGUS -Continue OP follow up as scheduled with Onc.  Code Status: Full Code Family Communication: Husband and granddaughter at bedside updated on plan of care. Disposition Plan: Home when medically stable, likely in 24 hours. HHPT/OT.   Consultants:  None   Antibiotics:  None   Subjective: Feels weak.  Objective: Filed Vitals:   06/03/13 2200 06/04/13 0534 06/04/13 0747 06/04/13 0956  BP: 142/72 148/74 138/64 140/68  Pulse: 64 60 60 56  Temp: 98.5 F (36.9 C) 98.4 F (36.9 C) 98 F (36.7 C) 98.8 F (37.1 C)  TempSrc: Oral Oral Oral Oral  Resp: 18 20 18 16   Height:      Weight:  108.3 kg (238 lb 12.1 oz)    SpO2: 93% 92% 92% 94%    Intake/Output Summary (Last 24 hours) at 06/04/13 1242 Last data filed at 06/04/13 0829  Gross per 24 hour  Intake    480 ml  Output      0 ml  Net    480 ml   Filed Weights   06/02/13 0300 06/03/13 0512 06/04/13 0534  Weight:  106.77 kg (235 lb 6.2 oz) 107.004 kg (235 lb 14.4 oz) 108.3 kg (238 lb 12.1 oz)    Exam:   General:  AA Ox3  Cardiovascular: RRR  Respiratory: CTA B  Abdomen: Obese/S/NT/ND/+BS  Extremities: Bilateral venous stasis ulceration. 1+edema bilateral.   Neurologic:  Non-focal.  Data Reviewed: Basic Metabolic Panel:  Recent Labs Lab 06/01/13 1640 06/02/13 0430 06/03/13 0515 06/04/13 0523  NA 139 139 139 139  K 4.0 3.8 4.0 4.0  CL 98 100 97 97  CO2 35* 33* 35* 37*  GLUCOSE 105* 104* 109* 99  BUN 18 19 22  24*  CREATININE 1.16* 1.27* 1.33* 1.13*  CALCIUM 10.0 9.4 9.5 9.4  MG  --   --  2.0  --    Liver Function Tests:  Recent Labs Lab 06/01/13 1640  AST 16  ALT 9  ALKPHOS 90  BILITOT 0.5  PROT 8.1  ALBUMIN 3.4*   No results found for this basename: LIPASE, AMYLASE,  in the last 168 hours No results found for this basename: AMMONIA,  in the last 168 hours CBC:  Recent Labs Lab 06/01/13 1640 06/02/13 0430 06/03/13 0515 06/04/13 0523  WBC 11.5* 11.0* 10.1 10.0  NEUTROABS 8.0*  --   --   --   HGB 12.3 11.6* 11.3* 11.0*  HCT 38.2 37.0 37.0 36.7  MCV 85.5 86.7 87.5 86.8  PLT 203 180 177 175  Cardiac Enzymes: No results found for this basename: CKTOTAL, CKMB, CKMBINDEX, TROPONINI,  in the last 168 hours BNP (last 3 results)  Recent Labs  06/02/13 0430  PROBNP 1564.0*   CBG:  Recent Labs Lab 06/02/13 0754 06/03/13 0749 06/04/13 0733  GLUCAP 108* 104* 96    Recent Results (from the past 240 hour(s))  WOUND CULTURE     Status: None   Collection Time    06/01/13  6:05 PM      Result Value Range Status   Specimen Description LEG   Final   Special Requests Normal   Final   Gram Stain     Final   Value: NO WBC SEEN     NO SQUAMOUS EPITHELIAL CELLS SEEN     NO ORGANISMS SEEN     Performed at [facility]   Culture     Final   Value: MULTIPLE ORGANISMS PRESENT, NONE PREDOMINANT     Note: NO STAPHYLOCOCCUS AUREUS ISOLATED NO GROUP A  STREP (S.PYOGENES) ISOLATED     Performed at [facility]   Report Status 06/04/2013 FINAL   Final  WOUND CULTURE     Status: None   Collection Time    06/01/13  6:05 PM      Result Value Range Status   Specimen Description LEG   Final   Special Requests NONE   Final   Gram Stain     Final   Value: ABUNDANT WBC PRESENT, PREDOMINANTLY PMN     NO SQUAMOUS EPITHELIAL CELLS SEEN     NO ORGANISMS SEEN     Performed at [facility]   Culture     Final   Value: MULTIPLE ORGANISMS PRESENT, NONE PREDOMINANT     Note: NO STAPHYLOCOCCUS AUREUS ISOLATED NO GROUP A STREP (S.PYOGENES) ISOLATED     Performed at [facility]   Report Status 06/04/2013 FINAL   Final     Studies: Dg Chest 2 View  06/03/2013   CLINICAL DATA:  Hypoxemia, history of COPD  EXAM: CHEST  2 VIEW  COMPARISON:  Chest x-ray of 06/01/2013  FINDINGS: There is now a small left pleural effusion present with left basilar atelectasis. Pneumonia posteriorly at the left lung base cannot be excluded. Cardiomegaly is stable and there may be a mild degree of pulmonary vascular congestion present. No bony abnormality is seen.  IMPRESSION: Small left pleural effusion with left basilar atelectasis. Cannot exclude left basilar pneumonia. Also question mild pulmonary vascular congestion.   Electronically Signed   By: Ivar Drape M.D.   On: 06/03/2013 11:59   Nm Pulmonary Perf And Vent  06/04/2013   CLINICAL DATA:  Chest pain and shortness of breath  EXAM: NUCLEAR MEDICINE VENTILATION - PERFUSION LUNG SCAN  Views: Anterior, posterior, left lateral, right lateral, RPO, LPO, RAO, LAO -ventilation perfusion  Radionuclide: Technetium 50mDTPA -ventilation; Technetium 926macroaggregated albumin-perfusion  Dose: 44.0 mCi-ventilation; 5.9 mCi-perfusion  Route of administration: Inhalation -ventilation; intravenous -perfusion  COMPARISON:  Chest radiograph June 03, 2013  FINDINGS: Radiotracer uptake on the ventilation study is  homogeneous and symmetric bilaterally. Heart is noted to be enlarged.  On the perfusion study, there is homogeneous and symmetric radiotracer uptake bilaterally.  There is no appreciable ventilation/perfusion mismatch.  IMPRESSION: The no appreciable ventilation or perfusion defects. Very low probability of pulmonary embolus.   Electronically Signed   By: WiLowella Grip.D.   On: 06/04/2013 12:36    Scheduled Meds: . albuterol  2  puff Inhalation BID  . allopurinol  100 mg Oral Daily  . aspirin EC  81 mg Oral Daily  . atorvastatin  10 mg Oral q1800  . furosemide  40 mg Oral Daily  . heparin  5,000 Units Subcutaneous Q8H  . hydrocerin   Topical Daily  . losartan  100 mg Oral Daily  . mometasone-formoterol  2 puff Inhalation BID  . nebivolol  5 mg Oral Daily  . tiotropium  18 mcg Inhalation Daily   Continuous Infusions:   Principal Problem:   Cellulitis and abscess of leg Active Problems:   Acute on chronic diastolic CHF (congestive heart failure)   B12 DEFICIENCY   Hyperlipidemia LDL goal < 100   GOUT   Obesity, unspecified   HYPERTENSION   COPD   GERD   ULCERATIVE COLITIS-LEFT SIDE   RENAL INSUFFICIENCY   Edema   Ulcer of leg, chronic, right   Monoclonal paraproteinemia   Venous stasis dermatitis    Time spent: 45 minutes.    Lelon Frohlich  [facility] Pager [phone]  If 7PM-7AM, please contact night-coverage at [email or web address], password [facility] 06/04/2013, 12:42 PM  LOS: 3 days

## 2013-06-04 NOTE — Progress Notes (Signed)
Met with pt and grand-daughter at bedside to discuss d/c planning and needs. Informed her tha PT/OT recommended home health services. P declined to have these services set up at home. Says she feels she does not need it. In addition I informed her that she is needing oxygen at home at discharge. Pt stated she does not want to have oxygen at home. She stated she works 3 days a week and does not want to carry oxygen around with her. I informed her in the presence of her grand-daughter that this would be unsafe. I advised her to think about it and let Dr Jerilee Hoh now what she decides. I spoke with Dr Jerilee Hoh and informed her of this conversation with pt.  Allene Dillon RN BSN   [phone]

## 2013-06-04 NOTE — Progress Notes (Signed)
Came to visit patient again. Met with her at bedside to explain in detail regarding [facility] services.  Patient read over consents and states she does not need [facility] Care Management services. She states she manages fine at home and refuses. Left [facility] Management brochure at bedside for her to call if she changes her mind in the future. Marthenia Rolling, MSN-Ed, RN,BSN-- [facility] [phone]

## 2013-06-04 NOTE — Progress Notes (Signed)
Occupational Therapy Treatment Patient Details Name: [name] MRN: [id number] DOB: [birth year] Today's Date: 06/04/2013 Time: 0705-0730 OT Time Calculation (min): 25 min  OT Assessment / Plan / Recommendation  History of present illness Acute CHF exacerbation, SOB, bilateral lower extremity cellulitis with chronic non healing ulcer on right lower extremity   OT comments  Pt. Awake and agreeable to participation in skilled o.t.  Able to state several energy conservation tech. For daily adls.  Declines use/need for a/e and was able to demonstrate lb dressing with no lob or safety concerns noted. Eager to return home and back to work, is very determined and hard working!   Follow Up Recommendations  Home health OT;Supervision - Intermittent                      Frequency Min 2X/week   Progress towards OT Goals Progress towards OT goals: Progressing toward goals  Plan Discharge plan remains appropriate    Precautions / Restrictions Precautions Precautions: Fall   Pertinent Vitals/Pain 2/10, repositioned    ADL  Lower Body Dressing: Performed;Supervision/safety Where Assessed - Lower Body Dressing: Supported standing Toilet Transfer: Chartered loss adjuster Method: Sit to Loss adjuster, chartered: Regular height toilet Toileting - Clothing Manipulation and Hygiene: Supervision/safety Where Assessed - Toileting Clothing Manipulation and Hygiene: Standing Transfers/Ambulation Related to ADLs: s with rw from bed to b.room, then requested no rw from b.room to recliner stating "i dont use one at home" ADL Comments: declined a/e stating "im just used to doing form myself".  able to doff underwear in standing and bend over to pick up off of the floor with no lob noted.  able to state examples of energy conservation tech. and states she had already been implementing them into her routine prior to admit.      OT Goals(current goals can now be found in  the care plan section)    Visit Information  Last OT Received On: 06/04/13 History of Present Illness: Acute CHF exacerbation, SOB, bilateral lower extremity cellulitis with chronic non healing ulcer on right lower extremity    Subjective Data   "i do everything for myself, i just get it done always have"          Cognition  Cognition Arousal/Alertness: Awake/alert Behavior During Therapy: WFL for tasks assessed/performed Overall Cognitive Status: Within Functional Limits for tasks assessed    Mobility  Bed Mobility Details for Bed Mobility Assistance: hob flat, no rails: pt. able to transition from supine to sit with s. Transfers Transfers: Sit to Stand;Stand to Sit Sit to Stand: 5: Supervision;From bed Stand to Sit: 5: Supervision;To toilet;To chair/3-in-1 Details for Transfer Assistance: cues for controlled descent into recliner          Balance Static Standing Balance Static Standing - Balance Support: Bilateral upper extremity supported;During functional activity   End of Session OT - End of Session Equipment Utilized During Treatment: Rolling walker Activity Tolerance: Patient tolerated treatment well Patient left: in chair;with call bell/phone within reach       Janice Coffin, COTA/L 06/04/2013, 7:36 AM

## 2013-06-04 NOTE — Evaluation (Addendum)
Physical Therapy Evaluation Patient Details Name: [name] MRN: [id number] DOB: 1941-03-18 Today's Date: 06/04/2013 Time: 1030-1059 PT Time Calculation (min): 29 min  PT Assessment / Plan / Recommendation History of Present Illness  Acute CHF exacerbation, SOB, bilateral lower extremity cellulitis with chronic non healing ulcer on right lower extremity  Clinical Impression  Pt demonstrates decreased activity tolerance, decreased balance, decreased mobility and generalized weakness during PT evaluation.  Note that her O2 sats dropped significantly yesterday when on RA, therefore assessed SaO2 on RA with amb and on 2L O2 during amb for probable need for O2 at home.  Granddaughter states she has noticed that pt gets increasingly SOB during mobility tasks at home and has noticed that her lips will turn blue at times.  Feel that she has probably had continual decline due to CHF and will need supplemental O2 at home to prevent desaturation.  PT recommends HHPT for follow up at D/C to maximize pts safety and function.  Husband states that he can have pt RW when she gets home.  See O2 evaluation below.  SATURATION QUALIFICATIONS: (This note is used to comply with regulatory documentation for home oxygen)  Patient Saturations on Room Air at Rest = 90%  Patient Saturations on Room Air while Ambulating = 84-87%  Patient Saturations on 2 Liters of oxygen while Ambulating = 88-91%  Please briefly explain why patient needs home oxygen: Note pt to desaturate quickly without use of oxygen, limiting pts functional mobility and decreasing O2 needs to body.     PT Assessment  Patient needs continued PT services    Follow Up Recommendations  Home health PT    Does the patient have the potential to tolerate intense rehabilitation      Barriers to Discharge        Equipment Recommendations  None recommended by PT (husband states he can get RW)    Recommendations for Other Services      Frequency Min 3X/week    Precautions / Restrictions Precautions Precautions: Fall Precaution Comments: s/p hernia repair, cellulitis BLE Restrictions Weight Bearing Restrictions: No   Pertinent Vitals/Pain 410 pain in abdomen, also states pain in chest.  RN aware.       Mobility  Bed Mobility Bed Mobility: Not assessed Details for Bed Mobility Assistance: Pt sitting in recliner when PT arrived.  Transfers Transfers: Sit to Stand;Stand to Sit Sit to Stand: 5: Supervision;With upper extremity assist;From chair/3-in-1;With armrests Stand to Sit: 5: Supervision;With upper extremity assist;With armrests;To chair/3-in-1 Details for Transfer Assistance: Cues for hand placement, controlled descent into recliner when sitting and also to keep RW with her until all the way at seating surface.  Ambulation/Gait Ambulation/Gait Assistance: 4: Min guard Ambulation Distance (Feet): 130 Feet (x 2 reps) Assistive device: Rolling walker Ambulation/Gait Assistance Details: Cues for continued pursed lip breathing throughout due to noted SOB, esp on first rep of gait training without supplemental O2.  Also provided cues for maintaining position closer to RW and also for upright posture throughout.   Gait Pattern: Step-through pattern;Decreased stride length;Trunk flexed;Wide base of support;Trendelenburg Stairs: No    Exercises     PT Diagnosis: Difficulty walking;Abnormality of gait;Generalized weakness;Acute pain  PT Problem List: Decreased strength;Decreased activity tolerance;Decreased balance;Decreased mobility;Decreased knowledge of use of DME;Decreased safety awareness;Decreased knowledge of precautions;Cardiopulmonary status limiting activity;Obesity;Pain PT Treatment Interventions: DME instruction;Gait training;Stair training;Functional mobility training;Therapeutic activities;Therapeutic exercise;Balance training;Patient/family education     PT Goals(Current goals can be found in the care  plan section) Acute  Rehab PT Goals Patient Stated Goal: Get well, go home and back to work PT Goal Formulation: With patient/family Time For Goal Achievement: 06/18/13 Potential to Achieve Goals: Good  Visit Information  Last PT Received On: 06/04/13 Assistance Needed: +1 History of Present Illness: Acute CHF exacerbation, SOB, bilateral lower extremity cellulitis with chronic non healing ulcer on right lower extremity       Prior [place] expects to be discharged to:: Private residence Living Arrangements: Spouse/significant other Available Help at Discharge: Family Type of Home: House Home Access: Stairs to enter Technical brewer of Steps: 1 STE (small threshold) Entrance Stairs-Rails: None Home Layout: One level Additional Comments: Pt's husband states that he is planning to have grab installed in bathroom/tub/shower this week, states he has RW "@ work" & may have access to 3:1 Prior Function Level of Independence: Independent Comments: Pt states that she works at [employer] and that she recently has had to take multiple rest breaks just walking from car to store, no AD. Communication Communication: No difficulties Dominant Hand: Right    Cognition  Cognition Arousal/Alertness: Awake/alert Behavior During Therapy: WFL for tasks assessed/performed Overall Cognitive Status: History of cognitive impairments - at baseline (pts family states some memory issues) Memory: Decreased recall of precautions    Extremity/Trunk Assessment Lower Extremity Assessment Lower Extremity Assessment: Generalized weakness Cervical / Trunk Assessment Cervical / Trunk Assessment: Normal   Balance Balance Balance Assessed: Yes Static Standing Balance Static Standing - Balance Support: Bilateral upper extremity supported;During functional activity Static Standing - Level of Assistance: 5: Stand by assistance  End of Session PT - End of  Session Equipment Utilized During Treatment: Oxygen Activity Tolerance: Patient limited by fatigue Patient left: in chair;with call bell/phone within reach;with family/visitor present Nurse Communication: Mobility status (O2 sats during amb, see note)  GP     Keagon Glascoe, Betha Loa 06/04/2013, 11:08 AM

## 2013-06-05 ENCOUNTER — Other Ambulatory Visit (HOSPITAL_BASED_OUTPATIENT_CLINIC_OR_DEPARTMENT_OTHER): Payer: Self-pay | Admitting: *Deleted

## 2013-06-05 DIAGNOSIS — G4730 Sleep apnea, unspecified: Secondary | ICD-10-CM

## 2013-06-05 LAB — BASIC METABOLIC PANEL
BUN: 31 mg/dL — ABNORMAL HIGH (ref 6–23)
Calcium: 9.9 mg/dL (ref 8.4–10.5)
Creatinine, Ser: 1.23 mg/dL — ABNORMAL HIGH (ref 0.50–1.10)
GFR calc Af Amer: 50 mL/min — ABNORMAL LOW (ref >90)
GFR calc non Af Amer: 43 mL/min — ABNORMAL LOW (ref >90)
Glucose, Bld: 105 mg/dL — ABNORMAL HIGH (ref 70–99)
Sodium: 138 mEq/L (ref 135–145)

## 2013-06-05 LAB — GLUCOSE, CAPILLARY: Glucose-Capillary: 92 mg/dL (ref 70–99)

## 2013-06-05 LAB — CBC
Hemoglobin: 11.2 g/dL — ABNORMAL LOW (ref 12.0–15.0)
MCH: 26.8 pg (ref 26.0–34.0)
MCHC: 30.9 g/dL (ref 30.0–36.0)
RDW: 15.2 % (ref 11.5–15.5)

## 2013-06-05 MED ORDER — TIOTROPIUM BROMIDE MONOHYDRATE 18 MCG IN CAPS: 18.0000 ug | ORAL_CAPSULE | Freq: Every day | RESPIRATORY_TRACT | Status: DC

## 2013-06-05 MED ORDER — FLUTICASONE-SALMETEROL 250-50 MCG/DOSE IN AEPB: 1.0000 | INHALATION_SPRAY | Freq: Two times a day (BID) | RESPIRATORY_TRACT | Status: DC

## 2013-06-05 MED ORDER — FUROSEMIDE 40 MG PO TABS: 40.0000 mg | ORAL_TABLET | Freq: Every day | ORAL | Status: DC

## 2013-06-05 MED ORDER — ALBUTEROL SULFATE (2.5 MG/3ML) 0.083% IN NEBU: 2.5000 mg | INHALATION_SOLUTION | RESPIRATORY_TRACT | Status: DC | PRN

## 2013-06-05 NOTE — Progress Notes (Signed)
Pt refused being set up on [email or web address] due to her not having a computer at home.

## 2013-06-05 NOTE — Discharge Summary (Signed)
Physician Discharge Summary  [name] EAV:[id number] DOB: 04/29/1941 DOA: 06/01/2013  PCP: [name], MD  Admit date: 06/01/2013 Discharge date: 06/05/2013  Time spent: 45 minutes  Recommendations for Outpatient Follow-up:  -Will be discharged home today. -An OP sleep study will be arranged. -Will be discharged on home oxygen. -Advised to follow up with her PCP in 2 weeks. -OP referral to Pulmonary is recommended.  Discharge Diagnoses:  Principal Problem:   Acute on chronic diastolic CHF (congestive heart failure) Active Problems:   B12 DEFICIENCY   Hyperlipidemia LDL goal < 100   GOUT   Obesity, unspecified   HYPERTENSION   COPD   GERD   ULCERATIVE COLITIS-LEFT SIDE   RENAL INSUFFICIENCY   Edema   Ulcer of leg, chronic, right   Cellulitis and abscess of leg   Monoclonal paraproteinemia   Venous stasis dermatitis   Acute-on-chronic respiratory failure   Discharge Condition: Stable and improved  Filed Weights   06/03/13 0512 06/04/13 0534 06/05/13 7829  Weight: 107.004 kg (235 lb 14.4 oz) 108.3 kg (238 lb 12.1 oz) 107.8 kg (237 lb 10.5 oz)    History of present illness:  [name] is a 72 y.o. female with past medical history of chronic lower extremity edema and a chronic nonhealing ulcer on the right lower extremity since January of 2013 presented initially to her PCP with worsening swelling, tightness, redness and pain in the lower extremities and purulent drainage from the ulcer on the right leg. She was advised by her PCP to come to the ED for IV antibiotics. She does have a history of bilateral lower extremity cellulitis and infected left lower extremity ulcer in 2011 with pseudomonas which required hospitalization and prolonged treatment. She has been having chills, fatigue and sweats. No measured fever. No nausea or vomiting, appetite has been decreased.  She reports that she had been following with the wound care clinicfor the right lower extremity  ulcer from 07/2011 until the wound healed over in the fall of 2013. Two weeks after leaving the clinic the wound re-opened and she has been managing it at home. Most recently she has been washing with warm water and dial soap and using honey on the wound. We were asked to admit her for further evaluation and management.     Hospital Course:   Acute on Chronic Respiratory Failure  -Believed to be multifactorial: COPD, CHF, MO, probably some OSA.  -See below for details.  -Have been unable to wean off oxygen, so will need to discharge on oxygen. -Will need to have OP sleep study arranged as well.   Acute on Chronic Diastolic CHF  -Volume status improved.  -Doubt accuracy of Is and Os.  -Lasix is now PO at home dose.   Bilateral Venous Stasis Dermatitis  -Chronic.  -Weeping improved with decreasing edema.  -I doubt she had infection. Looks like the drainage was serous likely from increased edema. -All cx data has been negative to date.  Hypoxemia  -74% on RA.  -ABG shows hypoxemia and compensated hypercarbia which points towards her being a chronic Co2 retainer.  -VQ scan low probability for PE. (unable to do CT Angio given CKD). -No evidence of PNA/ effusion/edema on CXR. -Will need oxygen on DC.   HTN  -Well controlled.  -Continue home medications.   CKD Stage III  -At baseline.   COPD  -No wheezes on lung auscultation.  -Not active.  -Will need oxygen given hypoxemia and pulmonary follow up on  DC.  -She has been non-compliant with advair and spiriva. -Have explained importance of compliance to help decrease morbidity of her COPD.  MGUS  -Continue OP follow up as scheduled with Onc.   Procedures:  None   Consultations:  None  Discharge Instructions  Discharge Orders   Future Appointments Provider Department Dept Phone   10/14/2013 9:00 AM [place] [phone]   10/14/2013 9:30 AM Wyatt Portela, MD [place] [phone]   Future Orders Complete By Expires   Diet - low sodium heart healthy  As directed    Discontinue IV  As directed    Increase activity slowly  As directed        Medication List    STOP taking these medications       albuterol 108 (90 BASE) MCG/ACT inhaler  Commonly known as:  PROAIR HFA  Replaced by:  albuterol (2.5 MG/3ML) 0.083% nebulizer solution      TAKE these medications       albuterol (2.5 MG/3ML) 0.083% nebulizer solution  Commonly known as:  PROVENTIL  Take 3 mLs (2.5 mg total) by nebulization every 2 (two) hours as needed for wheezing or shortness of breath.     allopurinol 100 MG tablet  Commonly known as:  ZYLOPRIM  TAKE 1 TABLET BY MOUTH DAILY FOR GOUT     aspirin 81 MG EC tablet  Take 81 mg by mouth daily.     BYSTOLIC 5 MG tablet  Generic drug:  nebivolol  TAKE 1 TABLET BY MOUTH DAILY     Cholecalciferol 50000 UNITS Tabs  Take 1 tablet by mouth once a week.     Fluticasone-Salmeterol 250-50 MCG/DOSE Aepb  Commonly known as:  ADVAIR DISKUS  Inhale 1 puff into the lungs 2 (two) times daily.     furosemide 40 MG tablet  Commonly known as:  LASIX  Take 1 tablet (40 mg total) by mouth daily.     losartan 100 MG tablet  Commonly known as:  COZAAR  TAKE 1 TABLET BY MOUTH EVERY DAY     rosuvastatin 10 MG tablet  Commonly known as:  CRESTOR  Take 1 tablet (10 mg total) by mouth daily.     tiotropium 18 MCG inhalation capsule  Commonly known as:  SPIRIVA  Place 1 capsule (18 mcg total) into inhaler and inhale daily.     VASCULERA Tabs  Take 1 capsule by mouth daily.       Allergies  Allergen Reactions  . Celebrex [Celecoxib]     edema  . Enalapril Cough  . Amlodipine Besylate     REACTION: edema  . Amoxicillin     REACTION: Diarrhea  . Codeine Sulfate     REACTION: Nausea  . Hydrocodone-Acetaminophen     REACTION: Nausea       Follow-up Information   Follow up with [name],  MD. Schedule an appointment as soon as possible for a visit in 2 weeks.   Specialty:  Internal Medicine   Contact information:   [address], [address] [place] [address] [phone]        The results of significant diagnostics from this hospitalization (including imaging, microbiology, ancillary and laboratory) are listed below for reference.    Significant Diagnostic Studies: Dg Chest 2 View  06/03/2013   CLINICAL DATA:  Hypoxemia, history of COPD  EXAM: CHEST  2 VIEW  COMPARISON:  Chest x-ray of 06/01/2013  FINDINGS: There is now a small left pleural effusion present with left basilar atelectasis. Pneumonia posteriorly at the left lung base cannot be excluded. Cardiomegaly is stable and there may be a mild degree of pulmonary vascular congestion present. No bony abnormality is seen.  IMPRESSION: Small left pleural effusion with left basilar atelectasis. Cannot exclude left basilar pneumonia. Also question mild pulmonary vascular congestion.   Electronically Signed   By: Ivar Drape M.D.   On: 06/03/2013 11:59   Dg Chest 2 View  06/01/2013   CLINICAL DATA:  72 year old female with wheezing and shortness of breath. Initial encounter.  EXAM: CHEST  2 VIEW  COMPARISON:  06/12/2012 and earlier.  FINDINGS: Stable cardiomegaly and mediastinal contours. Large body habitus. Stable lung volumes. No pneumothorax. Increased left basilar opacity, but no definite pleural effusion on the lateral view. No pneumothorax or edema. Similar appearance of streaky bibasilar opacities and. No consolidation identified. Visualized tracheal air column is within normal limits. No acute osseous abnormality identified.  IMPRESSION: Suggestion of small left pleural effusion. Otherwise stable streaky bibasilar opacities favored to reflect chronic atelectasis.   Electronically Signed   By: Lars Pinks M.D.   On: 06/01/2013 23:28   Nm Pulmonary Perf And Vent  06/04/2013   CLINICAL DATA:  Chest pain and  shortness of breath  EXAM: NUCLEAR MEDICINE VENTILATION - PERFUSION LUNG SCAN  Views: Anterior, posterior, left lateral, right lateral, RPO, LPO, RAO, LAO -ventilation perfusion  Radionuclide: Technetium 24mDTPA -ventilation; Technetium 964macroaggregated albumin-perfusion  Dose: 44.0 mCi-ventilation; 5.9 mCi-perfusion  Route of administration: Inhalation -ventilation; intravenous -perfusion  COMPARISON:  Chest radiograph June 03, 2013  FINDINGS: Radiotracer uptake on the ventilation study is homogeneous and symmetric bilaterally. Heart is noted to be enlarged.  On the perfusion study, there is homogeneous and symmetric radiotracer uptake bilaterally.  There is no appreciable ventilation/perfusion mismatch.  IMPRESSION: The no appreciable ventilation or perfusion defects. Very low probability of pulmonary embolus.   Electronically Signed   By: WiLowella Grip.D.   On: 06/04/2013 12:36    Microbiology: Recent Results (from the past 240 hour(s))  WOUND CULTURE     Status: None   Collection Time    06/01/13  6:05 PM      Result Value Range Status   Specimen Description LEG   Final   Special Requests Normal   Final   Gram Stain     Final   Value: NO WBC SEEN     NO SQUAMOUS EPITHELIAL CELLS SEEN     NO ORGANISMS SEEN     Performed at [facility]     Final   Value: MULTIPLE ORGANISMS PRESENT, NONE PREDOMINANT     Note: NO STAPHYLOCOCCUS AUREUS ISOLATED NO GROUP A STREP (S.PYOGENES) ISOLATED     Performed at [facility] Report Status 06/04/2013 FINAL   Final  WOUND CULTURE     Status: None   Collection Time    06/01/13  6:05 PM      Result Value Range Status   Specimen Description LEG   Final   Special Requests NONE   Final   Gram Stain     Final   Value: ABUNDANT WBC PRESENT, PREDOMINANTLY PMN     NO SQUAMOUS EPITHELIAL CELLS SEEN     NO ORGANISMS SEEN     Performed at [facility]     Final   Value: MULTIPLE  ORGANISMS PRESENT, NONE  PREDOMINANT     Note: NO STAPHYLOCOCCUS AUREUS ISOLATED NO GROUP A STREP (S.PYOGENES) ISOLATED     Performed at [facility]   Report Status 06/04/2013 FINAL   Final     Labs: Basic Metabolic Panel:  Recent Labs Lab 06/01/13 1640 06/02/13 0430 06/03/13 0515 06/04/13 0523 06/05/13 0544  NA 139 139 139 139 138  K 4.0 3.8 4.0 4.0 4.5  CL 98 100 97 97 98  CO2 35* 33* 35* 37* 36*  GLUCOSE 105* 104* 109* 99 105*  BUN 18 19 22  24* 31*  CREATININE 1.16* 1.27* 1.33* 1.13* 1.23*  CALCIUM 10.0 9.4 9.5 9.4 9.9  MG  --   --  2.0  --   --    Liver Function Tests:  Recent Labs Lab 06/01/13 1640  AST 16  ALT 9  ALKPHOS 90  BILITOT 0.5  PROT 8.1  ALBUMIN 3.4*   No results found for this basename: LIPASE, AMYLASE,  in the last 168 hours No results found for this basename: AMMONIA,  in the last 168 hours CBC:  Recent Labs Lab 06/01/13 1640 06/02/13 0430 06/03/13 0515 06/04/13 0523 06/05/13 0544  WBC 11.5* 11.0* 10.1 10.0 9.4  NEUTROABS 8.0*  --   --   --   --   HGB 12.3 11.6* 11.3* 11.0* 11.2*  HCT 38.2 37.0 37.0 36.7 36.3  MCV 85.5 86.7 87.5 86.8 86.8  PLT 203 180 177 175 176   Cardiac Enzymes: No results found for this basename: CKTOTAL, CKMB, CKMBINDEX, TROPONINI,  in the last 168 hours BNP: BNP (last 3 results)  Recent Labs  06/02/13 0430  PROBNP 1564.0*   CBG:  Recent Labs Lab 06/02/13 0754 06/03/13 0749 06/04/13 0733 06/05/13 0740  GLUCAP 108* 104* 96 92       Signed:  HERNANDEZ ACOSTA,ESTELA  [facility] Pager: [phone] 06/05/2013, 9:20 AM

## 2013-06-05 NOTE — Progress Notes (Signed)
Occupational Therapy Treatment Patient Details Name: [name] MRN: [id number] DOB: 10-15-1940 Today's Date: 06/05/2013 Time: 0354-6568 OT Time Calculation (min): 23 min  OT Assessment / Plan / Recommendation  History of present illness Acute CHF exacerbation, SOB, bilateral lower extremity cellulitis with chronic non healing ulcer on right lower extremity   OT comments  Pt progressing well.  She is very independent and safely retrieved objects with RW.  Reviewed energy conservation   Follow Up Recommendations  Home health OT;Supervision - Intermittent    Barriers to Discharge       Equipment Recommendations  3 in 1 bedside comode (if husband doesn't get this)    Recommendations for Other Services    Frequency Min 2X/week   Progress towards OT Goals Progress towards OT goals: Progressing toward goals  Plan Discharge plan remains appropriate    Precautions / Restrictions Precautions Precautions: Fall Restrictions Weight Bearing Restrictions: No   Pertinent Vitals/Pain No pain.  On 2 liters 02, sats 93-95%    ADL  Toilet Transfer: Supervision/safety;Simulated Armed forces technical officer Method: Sit to Loss adjuster, chartered:  (recliner) Transfers/Ambulation Related to ADLs: ambulated around room to retrieve adl objects from different height surfaces.  Pt brought walker in close and was safe with retrieval.  Discussed best height to place commonly used things at home.  Pt is familiar with reachers but doesn't have this.  She works at [employer], and they have them there.  Discussed advantage of shorter one so that items don't feel as heavy.  Pt has a stepstool--encouraged not to use this right now ADL Comments: Educated on energy conservation and handout given; pt verbalizes understanding.  She is concerned about going back to work:  doesn't want to lose her independence    OT Diagnosis:    OT Problem List:   OT Treatment Interventions:     OT Goals(current goals can now be  found in the care plan section)    Visit Information  Last OT Received On: 06/05/13 Assistance Needed: +1 History of Present Illness: Acute CHF exacerbation, SOB, bilateral lower extremity cellulitis with chronic non healing ulcer on right lower extremity    Subjective Data      Prior Functioning       Cognition  Cognition Arousal/Alertness: Awake/alert Behavior During Therapy: WFL for tasks assessed/performed Overall Cognitive Status: Within Functional Limits for tasks assessed    Mobility  Transfers Sit to Stand: 5: Supervision;With upper extremity assist;From chair/3-in-1;With armrests Stand to Sit: 5: Supervision;With upper extremity assist;With armrests;To chair/3-in-1 Details for Transfer Assistance: cues for hand position and leaning forward to control descent.      Exercises      Balance Balance Balance Assessed: Yes Dynamic Standing Balance Dynamic Standing - Balance Support: Left upper extremity supported Dynamic Standing - Level of Assistance: 5: Stand by assistance Dynamic Standing - Balance Activities: Reaching for objects Dynamic Standing - Comments: with RW,retrieved items from different heights   End of Session OT - End of Session Activity Tolerance: Patient tolerated treatment well Patient left: in chair;with call bell/phone within reach;with family/visitor present  [place] 06/05/2013, 11:00 AM Lesle Chris, OTR/L [phone] 06/05/2013

## 2013-06-12 ENCOUNTER — Telehealth: Payer: Self-pay | Admitting: *Deleted

## 2013-06-12 NOTE — Telephone Encounter (Signed)
Atika from [facility] called states pt declined home health and [facility] care management services after D/C from CHF admit, wanted to make you aware.

## 2013-06-17 DIAGNOSIS — I509 Heart failure, unspecified: Secondary | ICD-10-CM

## 2013-06-17 DIAGNOSIS — L97209 Non-pressure chronic ulcer of unspecified calf with unspecified severity: Secondary | ICD-10-CM

## 2013-06-17 DIAGNOSIS — I5033 Acute on chronic diastolic (congestive) heart failure: Secondary | ICD-10-CM

## 2013-06-17 DIAGNOSIS — L97909 Non-pressure chronic ulcer of unspecified part of unspecified lower leg with unspecified severity: Secondary | ICD-10-CM

## 2013-06-22 ENCOUNTER — Encounter: Payer: Self-pay | Admitting: Internal Medicine

## 2013-06-22 ENCOUNTER — Ambulatory Visit (INDEPENDENT_AMBULATORY_CARE_PROVIDER_SITE_OTHER): Payer: Medicare Other | Admitting: Internal Medicine

## 2013-06-22 ENCOUNTER — Telehealth: Payer: Self-pay | Admitting: *Deleted

## 2013-06-22 VITALS — BP 142/64 | HR 55 | Temp 98.0°F | Resp 16 | Ht 60.0 in | Wt 232.8 lb

## 2013-06-22 DIAGNOSIS — G4730 Sleep apnea, unspecified: Secondary | ICD-10-CM

## 2013-06-22 DIAGNOSIS — I10 Essential (primary) hypertension: Secondary | ICD-10-CM

## 2013-06-22 DIAGNOSIS — I509 Heart failure, unspecified: Secondary | ICD-10-CM

## 2013-06-22 DIAGNOSIS — I5033 Acute on chronic diastolic (congestive) heart failure: Secondary | ICD-10-CM

## 2013-06-22 DIAGNOSIS — G4733 Obstructive sleep apnea (adult) (pediatric): Secondary | ICD-10-CM | POA: Insufficient documentation

## 2013-06-22 HISTORY — DX: Obstructive sleep apnea (adult) (pediatric): G47.33

## 2013-06-22 NOTE — Patient Instructions (Signed)
Type 2 Diabetes Mellitus, Adult Type 2 diabetes mellitus, often simply referred to as type 2 diabetes, is a long-lasting (chronic) disease. In type 2 diabetes, the pancreas does not make enough insulin (a hormone), the cells are less responsive to the insulin that is made (insulin resistance), or both. Normally, insulin moves sugars from food into the tissue cells. The tissue cells use the sugars for energy. The lack of insulin or the lack of normal response to insulin causes excess sugars to build up in the blood instead of going into the tissue cells. As a result, high blood sugar (hyperglycemia) develops. The effect of high sugar (glucose) levels can cause many complications. Type 2 diabetes was also previously called adult-onset diabetes but it can occur at any age.  RISK FACTORS  A person is predisposed to developing type 2 diabetes if someone in the family has the disease and also has one or more of the following primary risk factors:  Overweight.  An inactive lifestyle.  A history of consistently eating high-calorie foods. Maintaining a normal weight and regular physical activity can reduce the chance of developing type 2 diabetes. SYMPTOMS  A person with type 2 diabetes may not show symptoms initially. The symptoms of type 2 diabetes appear slowly. The symptoms include:  Increased thirst (polydipsia).  Increased urination (polyuria).  Increased urination during the night (nocturia).  Weight loss. This weight loss may be rapid.  Frequent, recurring infections.  Tiredness (fatigue).  Weakness.  Vision changes, such as blurred vision.  Fruity smell to your breath.  Abdominal pain.  Nausea or vomiting.  Cuts or bruises which are slow to heal.  Tingling or numbness in the hands or feet. DIAGNOSIS Type 2 diabetes is frequently not diagnosed until complications of diabetes are present. Type 2 diabetes is diagnosed when symptoms or complications are present and when blood  glucose levels are increased. Your blood glucose level may be checked by one or more of the following blood tests:  A fasting blood glucose test. You will not be allowed to eat for at least 8 hours before a blood sample is taken.  A random blood glucose test. Your blood glucose is checked at any time of the day regardless of when you ate.  A hemoglobin A1c blood glucose test. A hemoglobin A1c test provides information about blood glucose control over the previous 3 months.  An oral glucose tolerance test (OGTT). Your blood glucose is measured after you have not eaten (fasted) for 2 hours and then after you drink a glucose-containing beverage. TREATMENT   You may need to take insulin or diabetes medicine daily to keep blood glucose levels in the desired range.  You will need to match insulin dosing with exercise and healthy food choices. The treatment goal is to maintain the before meal blood sugar (preprandial glucose) level at 70 130 mg/dL. HOME CARE INSTRUCTIONS   Have your hemoglobin A1c level checked twice a year.  Perform daily blood glucose monitoring as directed by your caregiver.  Monitor urine ketones when you are ill and as directed by your caregiver.  Take your diabetes medicine or insulin as directed by your caregiver to maintain your blood glucose levels in the desired range.  Never run out of diabetes medicine or insulin. It is needed every day.  Adjust insulin based on your intake of carbohydrates. Carbohydrates can raise blood glucose levels but need to be included in your diet. Carbohydrates provide vitamins, minerals, and fiber which are an essential part of   a healthy diet. Carbohydrates are found in fruits, vegetables, whole grains, dairy products, legumes, and foods containing added sugars.    Eat healthy foods. Alternate 3 meals with 3 snacks.  Lose weight if overweight.  Carry a medical alert card or wear your medical alert jewelry.  Carry a 15 gram  carbohydrate snack with you at all times to treat low blood glucose (hypoglycemia). Some examples of 15 gram carbohydrate snacks include:  Glucose tablets, 3 or 4   Glucose gel, 15 gram tube  Raisins, 2 tablespoons (24 grams)  Jelly beans, 6  Animal crackers, 8  Regular pop, 4 ounces (120 mL)  Gummy treats, 9  Recognize hypoglycemia. Hypoglycemia occurs with blood glucose levels of 70 mg/dL and below. The risk for hypoglycemia increases when fasting or skipping meals, during or after intense exercise, and during sleep. Hypoglycemia symptoms can include:  Tremors or shakes.  Decreased ability to concentrate.  Sweating.  Increased heart rate.  Headache.  Dry mouth.  Hunger.  Irritability.  Anxiety.  Restless sleep.  Altered speech or coordination.  Confusion.  Treat hypoglycemia promptly. If you are alert and able to safely swallow, follow the 15:15 rule:  Take 15 20 grams of rapid-acting glucose or carbohydrate. Rapid-acting options include glucose gel, glucose tablets, or 4 ounces (120 mL) of fruit juice, regular soda, or low fat milk.  Check your blood glucose level 15 minutes after taking the glucose.  Take 15 20 grams more of glucose if the repeat blood glucose level is still 70 mg/dL or below.  Eat a meal or snack within 1 hour once blood glucose levels return to normal.    Be alert to polyuria and polydipsia which are early signs of hyperglycemia. An early awareness of hyperglycemia allows for prompt treatment. Treat hyperglycemia as directed by your caregiver.  Engage in at least 150 minutes of moderate-intensity physical activity a week, spread over at least 3 days of the week or as directed by your caregiver. In addition, you should engage in resistance exercise at least 2 times a week or as directed by your caregiver.  Adjust your medicine and food intake as needed if you start a new exercise or sport.  Follow your sick day plan at any time you  are unable to eat or drink as usual.  Avoid tobacco use.  Limit alcohol intake to no more than 1 drink per day for nonpregnant women and 2 drinks per day for men. You should drink alcohol only when you are also eating food. Talk with your caregiver whether alcohol is safe for you. Tell your caregiver if you drink alcohol several times a week.  Follow up with your caregiver regularly.  Schedule an eye exam soon after the diagnosis of type 2 diabetes and then annually.  Perform daily skin and foot care. Examine your skin and feet daily for cuts, bruises, redness, nail problems, bleeding, blisters, or sores. A foot exam by a caregiver should be done annually.  Brush your teeth and gums at least twice a day and floss at least once a day. Follow up with your dentist regularly.  Share your diabetes management plan with your workplace or school.  Stay up-to-date with immunizations.  Learn to manage stress.  Obtain ongoing diabetes education and support as needed.  Participate in, or seek rehabilitation as needed to maintain or improve independence and quality of life. Request a physical or occupational therapy referral if you are having foot or hand numbness or difficulties with grooming,   dressing, eating, or physical activity. SEEK MEDICAL CARE IF:   You are unable to eat food or drink fluids for more than 6 hours.  You have nausea and vomiting for more than 6 hours.  Your blood glucose level is over 240 mg/dL.  There is a change in mental status.  You develop an additional serious illness.  You have diarrhea for more than 6 hours.  You have been sick or have had a fever for a couple of days and are not getting better.  You have pain during any physical activity.  SEEK IMMEDIATE MEDICAL CARE IF:  You have difficulty breathing.  You have moderate to large ketone levels. MAKE SURE YOU:  Understand these instructions.  Will watch your condition.  Will get help right away if  you are not doing well or get worse. Document Released: 07/16/2005 Document Revised: 04/09/2012 Document Reviewed: 02/12/2012 [employer] Patient Information 2014 [employer].  

## 2013-06-22 NOTE — Progress Notes (Signed)
Subjective:    Patient ID: [name], female    DOB: February 13, 1941, 72 y.o.   MRN: [id number]  Hypertension This is a chronic problem. The current episode started more than 1 year ago. The problem has been gradually improving since onset. The problem is controlled. Associated symptoms include peripheral edema. Pertinent negatives include no anxiety, blurred vision, chest pain, headaches, malaise/fatigue, neck pain, orthopnea, palpitations, PND, shortness of breath or sweats. Past treatments include diuretics, beta blockers and angiotensin blockers. The current treatment provides moderate improvement. Compliance problems include diet and exercise.  Hypertensive end-organ damage includes kidney disease, heart failure and left ventricular hypertrophy. Identifiable causes of hypertension include chronic renal disease and sleep apnea.      Review of Systems  Constitutional: Positive for fatigue. Negative for fever, chills, malaise/fatigue, diaphoresis, activity change, appetite change and unexpected weight change.  HENT: Negative.   Eyes: Negative.  Negative for blurred vision.  Respiratory: Positive for apnea. Negative for cough, choking, chest tightness, shortness of breath, wheezing and stridor.   Cardiovascular: Negative.  Negative for chest pain, palpitations, orthopnea, leg swelling and PND.  Gastrointestinal: Negative.  Negative for nausea, vomiting, abdominal pain, diarrhea, constipation and blood in stool.  Endocrine: Negative.   Genitourinary: Negative.   Musculoskeletal: Negative.  Negative for neck pain.  Skin: Negative.   Allergic/Immunologic: Negative.   Neurological: Negative.  Negative for dizziness, tremors, syncope, speech difficulty, weakness, light-headedness, numbness and headaches.  Hematological: Negative.  Negative for adenopathy. Does not bruise/bleed easily.  Psychiatric/Behavioral: Negative.        Objective:   Physical Exam  Vitals reviewed. Constitutional:  She is oriented to person, place, and time. She appears well-developed and well-nourished.  Non-toxic appearance. She does not have a sickly appearance. She does not appear ill. No distress.  HENT:  Head: Normocephalic and atraumatic.  Mouth/Throat: Oropharynx is clear and moist. No oropharyngeal exudate.  Eyes: Conjunctivae are normal. Right eye exhibits no discharge. Left eye exhibits no discharge. No scleral icterus.  Neck: Normal range of motion. Neck supple. No JVD present. No tracheal deviation present. No thyromegaly present.  Cardiovascular: Normal rate, regular rhythm, normal heart sounds and intact distal pulses.  Exam reveals no gallop and no friction rub.   No murmur heard. Pulmonary/Chest: Effort normal and breath sounds normal. No stridor. No respiratory distress. She has no wheezes. She has no rales. She exhibits no tenderness.  Abdominal: Soft. Bowel sounds are normal. She exhibits no distension and no mass. There is no tenderness. There is no rebound and no guarding.  Musculoskeletal: Normal range of motion. She exhibits edema. She exhibits no tenderness.       Right lower leg: She exhibits edema. She exhibits no tenderness, no bony tenderness, no swelling, no deformity and no laceration.       Left lower leg: She exhibits edema (trace pitting edema over BLE). She exhibits no tenderness, no bony tenderness, no swelling, no deformity and no laceration.       Legs: Lymphadenopathy:    She has no cervical adenopathy.  Neurological: She is oriented to person, place, and time.  Skin: Skin is warm and dry. No rash noted. She is not diaphoretic. No erythema. No pallor.  Psychiatric: She has a normal mood and affect. Her behavior is normal. Judgment and thought content normal.     Lab Results  Component Value Date   WBC 9.4 06/05/2013   HGB 11.2* 06/05/2013   HCT 36.3 06/05/2013   PLT 176 06/05/2013  GLUCOSE 105* 06/05/2013   CHOL 183 02/05/2013   TRIG 156.0* 02/05/2013   HDL 44.00  02/05/2013   LDLDIRECT 135.3 11/20/2010   LDLCALC 108* 02/05/2013   ALT 9 06/01/2013   AST 16 06/01/2013   NA 138 06/05/2013   K 4.5 06/05/2013   CL 98 06/05/2013   CREATININE 1.23* 06/05/2013   BUN 31* 06/05/2013   CO2 36* 06/05/2013   TSH 3.30 02/05/2013   HGBA1C 6.4 02/05/2013   MICROALBUR 15.9* 02/05/2013       Assessment & Plan:

## 2013-06-22 NOTE — Telephone Encounter (Signed)
Left message on VM of MDs message

## 2013-06-22 NOTE — Telephone Encounter (Signed)
Manuela Schwartz called requesting OT for two additional weeks.  Please advise

## 2013-06-22 NOTE — Assessment & Plan Note (Signed)
Her volume status is improved

## 2013-06-22 NOTE — Progress Notes (Signed)
Pre visit review using our clinic review tool, if applicable. No additional management support is needed unless otherwise documented below in the visit note. 

## 2013-06-22 NOTE — Assessment & Plan Note (Signed)
She is scheduled for a sleep study soon

## 2013-06-22 NOTE — Assessment & Plan Note (Signed)
Her BP is well controlled 

## 2013-06-22 NOTE — Telephone Encounter (Signed)
ok 

## 2013-07-13 ENCOUNTER — Other Ambulatory Visit: Payer: Self-pay | Admitting: Internal Medicine

## 2013-08-31 ENCOUNTER — Encounter: Payer: Self-pay | Admitting: Internal Medicine

## 2013-08-31 ENCOUNTER — Ambulatory Visit (INDEPENDENT_AMBULATORY_CARE_PROVIDER_SITE_OTHER): Payer: Medicare Other | Admitting: Internal Medicine

## 2013-08-31 ENCOUNTER — Other Ambulatory Visit (INDEPENDENT_AMBULATORY_CARE_PROVIDER_SITE_OTHER): Payer: Medicare Other

## 2013-08-31 VITALS — BP 138/70 | HR 69 | Temp 97.3°F | Resp 16 | Ht 60.0 in | Wt 236.0 lb

## 2013-08-31 DIAGNOSIS — R609 Edema, unspecified: Secondary | ICD-10-CM

## 2013-08-31 DIAGNOSIS — E785 Hyperlipidemia, unspecified: Secondary | ICD-10-CM

## 2013-08-31 DIAGNOSIS — I5033 Acute on chronic diastolic (congestive) heart failure: Secondary | ICD-10-CM

## 2013-08-31 DIAGNOSIS — E1129 Type 2 diabetes mellitus with other diabetic kidney complication: Secondary | ICD-10-CM

## 2013-08-31 DIAGNOSIS — I10 Essential (primary) hypertension: Secondary | ICD-10-CM

## 2013-08-31 DIAGNOSIS — E1165 Type 2 diabetes mellitus with hyperglycemia: Secondary | ICD-10-CM

## 2013-08-31 DIAGNOSIS — N259 Disorder resulting from impaired renal tubular function, unspecified: Secondary | ICD-10-CM

## 2013-08-31 DIAGNOSIS — R002 Palpitations: Secondary | ICD-10-CM

## 2013-08-31 LAB — BASIC METABOLIC PANEL
BUN: 40 mg/dL — AB (ref 6–23)
CHLORIDE: 101 meq/L (ref 96–112)
CO2: 28 mEq/L (ref 19–32)
Calcium: 9.3 mg/dL (ref 8.4–10.5)
Creatinine, Ser: 1.3 mg/dL — ABNORMAL HIGH (ref 0.4–1.2)
GFR: 41.19 mL/min — ABNORMAL LOW (ref >60.00)
GLUCOSE: 96 mg/dL (ref 70–99)
Potassium: 4.7 mEq/L (ref 3.5–5.1)
Sodium: 139 mEq/L (ref 135–145)

## 2013-08-31 LAB — URINALYSIS, ROUTINE W REFLEX MICROSCOPIC
Bilirubin Urine: NEGATIVE
Hgb urine dipstick: NEGATIVE
Ketones, ur: NEGATIVE
Leukocytes, UA: NEGATIVE
NITRITE: NEGATIVE
PH: 6 (ref 5.0–8.0)
RBC / HPF: NONE SEEN (ref 0–?)
Specific Gravity, Urine: 1.01 (ref 1.000–1.030)
TOTAL PROTEIN, URINE-UPE24: NEGATIVE
Urine Glucose: NEGATIVE
Urobilinogen, UA: 0.2 (ref 0.0–1.0)
WBC UA: NONE SEEN (ref 0–?)

## 2013-08-31 LAB — HEMOGLOBIN A1C: HEMOGLOBIN A1C: 6.3 % (ref 4.6–6.5)

## 2013-08-31 LAB — LIPID PANEL
CHOLESTEROL: 105 mg/dL (ref 0–200)
HDL: 43.2 mg/dL (ref >39.00)
LDL Cholesterol: 33 mg/dL (ref 0–99)
Total CHOL/HDL Ratio: 2
Triglycerides: 143 mg/dL (ref 0.0–149.0)
VLDL: 28.6 mg/dL (ref 0.0–40.0)

## 2013-08-31 LAB — TSH: TSH: 3.39 u[IU]/mL (ref 0.35–5.50)

## 2013-08-31 NOTE — Assessment & Plan Note (Signed)
Her edema has improved today

## 2013-08-31 NOTE — Patient Instructions (Signed)
Palpitations   A palpitation is the feeling that your heartbeat is irregular or is faster than normal. It may feel like your heart is fluttering or skipping a beat. Palpitations are usually not a serious problem. However, in some cases, you may need further medical evaluation.  CAUSES   Palpitations can be caused by:   Smoking.   Caffeine or other stimulants, such as diet pills or energy drinks.   Alcohol.   Stress and anxiety.   Strenuous physical activity.   Fatigue.   Certain medicines.   Heart disease, especially if you have a history of arrhythmias. This includes atrial fibrillation, atrial flutter, or supraventricular tachycardia.   An improperly working pacemaker or defibrillator.  DIAGNOSIS   To find the cause of your palpitations, your caregiver will take your history and perform a physical exam. Tests may also be done, including:   Electrocardiography (ECG). This test records the heart's electrical activity.   Cardiac monitoring. This allows your caregiver to monitor your heart rate and rhythm in real time.   Holter monitor. This is a portable device that records your heartbeat and can help diagnose heart arrhythmias. It allows your caregiver to track your heart activity for several days, if needed.   Stress tests by exercise or by giving medicine that makes the heart beat faster.  TREATMENT   Treatment of palpitations depends on the cause of your symptoms and can vary greatly. Most cases of palpitations do not require any treatment other than time, relaxation, and monitoring your symptoms. Other causes, such as atrial fibrillation, atrial flutter, or supraventricular tachycardia, usually require further treatment.  HOME CARE INSTRUCTIONS    Avoid:   Caffeinated coffee, tea, soft drinks, diet pills, and energy drinks.   Chocolate.   Alcohol.   Stop smoking if you smoke.   Reduce your stress and anxiety. Things that can help you relax include:   A method that measures bodily functions so  you can learn to control them (biofeedback).   Yoga.   Meditation.   Physical activity such as swimming, jogging, or walking.   Get plenty of rest and sleep.  SEEK MEDICAL CARE IF:    You continue to have a fast or irregular heartbeat beyond 24 hours.   Your palpitations occur more often.  SEEK IMMEDIATE MEDICAL CARE IF:   You develop chest pain or shortness of breath.   You have a severe headache.   You feel dizzy, or you faint.  MAKE SURE YOU:   Understand these instructions.   Will watch your condition.   Will get help right away if you are not doing well or get worse.  Document Released: 07/13/2000 Document Revised: 11/10/2012 Document Reviewed: 09/14/2011  ExitCare Patient Information 2014 [employer]

## 2013-08-31 NOTE — Assessment & Plan Note (Signed)
Her EKG today shows NSR with no arrhythmia, her palpitations sound benign but I am concerned about A fib and SVT so I have asked her to have an event monitor done Today I will check her labs to look for secondary causes of palpitations

## 2013-08-31 NOTE — Progress Notes (Signed)
Pre visit review using our clinic review tool, if applicable. No additional management support is needed unless otherwise documented below in the visit note. 

## 2013-08-31 NOTE — Assessment & Plan Note (Signed)
I will check her A1C and will advise further

## 2013-08-31 NOTE — Assessment & Plan Note (Signed)
Her BP is well controlled Today I will check her lytes and renal function 

## 2013-08-31 NOTE — Assessment & Plan Note (Signed)
I will check her FLP today and will advise further if needed

## 2013-08-31 NOTE — Progress Notes (Signed)
Subjective:    Patient ID: [name], female    DOB: 09/15/1940, 73 y.o.   MRN: [id number]  HPI Comments: She complains of intermittent episodes of heart racing, beating irregularly, and sometimes pounding in her chest.  Palpitations  This is a new problem. The current episode started 1 to 4 weeks ago. The problem occurs intermittently. The problem has been unchanged. Episode Length: 1. Nothing aggravates the symptoms. Associated symptoms include an irregular heartbeat. Pertinent negatives include no anxiety, chest fullness, chest pain, coughing, diaphoresis, dizziness, fever, malaise/fatigue, nausea, near-syncope, numbness, shortness of breath, syncope, vomiting or weakness. She has tried nothing for the symptoms. Risk factors include diabetes mellitus and obesity.      Review of Systems  Constitutional: Negative.  Negative for fever, chills, malaise/fatigue, diaphoresis, appetite change and fatigue.  HENT: Negative.   Eyes: Negative.   Respiratory: Negative.  Negative for apnea, cough, choking, chest tightness, shortness of breath, wheezing and stridor.   Cardiovascular: Positive for palpitations and leg swelling. Negative for chest pain, syncope and near-syncope.  Gastrointestinal: Negative.  Negative for nausea, vomiting, abdominal pain, diarrhea, constipation and blood in stool.  Endocrine: Negative.   Genitourinary: Negative.   Musculoskeletal: Negative.   Skin: Negative.   Allergic/Immunologic: Negative.   Neurological: Negative.  Negative for dizziness, tremors, weakness, light-headedness, numbness and headaches.  Hematological: Negative.  Negative for adenopathy. Does not bruise/bleed easily.  Psychiatric/Behavioral: Negative.        Objective:   Physical Exam  Vitals reviewed. Constitutional: She is oriented to person, place, and time. She appears well-developed and well-nourished. No distress.  HENT:  Head: Normocephalic and atraumatic.  Mouth/Throat:  Oropharynx is clear and moist. No oropharyngeal exudate.  Eyes: Conjunctivae are normal. Right eye exhibits no discharge. Left eye exhibits no discharge. No scleral icterus.  Neck: Normal range of motion. Neck supple. No JVD present. No tracheal deviation present. No thyromegaly present.  Cardiovascular: Normal rate, regular rhythm, normal heart sounds and intact distal pulses.  Exam reveals no gallop and no friction rub.   No murmur heard. Pulmonary/Chest: Effort normal and breath sounds normal. No stridor. No respiratory distress. She has no wheezes. She has no rales. She exhibits no tenderness.  Abdominal: Soft. Bowel sounds are normal. She exhibits no distension and no mass. There is no tenderness. There is no rebound and no guarding.  Musculoskeletal: Normal range of motion. She exhibits edema (1+ pitting edema in BLE). She exhibits no tenderness.  Lymphadenopathy:    She has no cervical adenopathy.  Neurological: She is oriented to person, place, and time.  Skin: Skin is warm and dry. No rash noted. She is not diaphoretic. No erythema. No pallor.  Psychiatric: She has a normal mood and affect. Her behavior is normal. Judgment and thought content normal.     Lab Results  Component Value Date   WBC 9.4 06/05/2013   HGB 11.2* 06/05/2013   HCT 36.3 06/05/2013   PLT 176 06/05/2013   GLUCOSE 105* 06/05/2013   CHOL 183 02/05/2013   TRIG 156.0* 02/05/2013   HDL 44.00 02/05/2013   LDLDIRECT 135.3 11/20/2010   LDLCALC 108* 02/05/2013   ALT 9 06/01/2013   AST 16 06/01/2013   NA 138 06/05/2013   K 4.5 06/05/2013   CL 98 06/05/2013   CREATININE 1.23* 06/05/2013   BUN 31* 06/05/2013   CO2 36* 06/05/2013   TSH 3.30 02/05/2013   HGBA1C 6.4 02/05/2013   MICROALBUR 15.9* 02/05/2013  Assessment & Plan:

## 2013-10-01 ENCOUNTER — Encounter: Payer: Self-pay | Admitting: *Deleted

## 2013-10-01 ENCOUNTER — Encounter (INDEPENDENT_AMBULATORY_CARE_PROVIDER_SITE_OTHER): Payer: Medicare Other

## 2013-10-01 DIAGNOSIS — R002 Palpitations: Secondary | ICD-10-CM

## 2013-10-01 NOTE — Progress Notes (Signed)
Patient ID: [name], female   DOB: May 07, 1941, 73 y.o.   MRN: [id number] Ecardio verite 30 day cardiac event monitor applied to patient.

## 2013-10-09 ENCOUNTER — Telehealth: Payer: Self-pay | Admitting: *Deleted

## 2013-10-09 NOTE — Telephone Encounter (Signed)
Patient phoned stating that per her last conversation with PCP, she had asked that [facility] be notified to "come & pick up my oxygen equipment"  CB# [phone]

## 2013-10-14 ENCOUNTER — Other Ambulatory Visit (HOSPITAL_BASED_OUTPATIENT_CLINIC_OR_DEPARTMENT_OTHER): Payer: Medicare Other

## 2013-10-14 ENCOUNTER — Encounter: Payer: Self-pay | Admitting: Oncology

## 2013-10-14 ENCOUNTER — Telehealth: Payer: Self-pay | Admitting: Oncology

## 2013-10-14 ENCOUNTER — Ambulatory Visit (HOSPITAL_BASED_OUTPATIENT_CLINIC_OR_DEPARTMENT_OTHER): Payer: Medicare Other | Admitting: Oncology

## 2013-10-14 VITALS — BP 192/61 | HR 61 | Temp 97.8°F | Resp 20 | Ht 60.0 in | Wt 239.5 lb

## 2013-10-14 DIAGNOSIS — D472 Monoclonal gammopathy: Secondary | ICD-10-CM

## 2013-10-14 DIAGNOSIS — R52 Pain, unspecified: Secondary | ICD-10-CM

## 2013-10-14 LAB — COMPREHENSIVE METABOLIC PANEL (CC13)
ALT: 13 U/L (ref 0–55)
ANION GAP: 9 meq/L (ref 3–11)
AST: 17 U/L (ref 5–34)
Albumin: 3.4 g/dL — ABNORMAL LOW (ref 3.5–5.0)
Alkaline Phosphatase: 81 U/L (ref 40–150)
BILIRUBIN TOTAL: 0.35 mg/dL (ref 0.20–1.20)
BUN: 31.2 mg/dL — ABNORMAL HIGH (ref 7.0–26.0)
CO2: 30 mEq/L — ABNORMAL HIGH (ref 22–29)
CREATININE: 1.2 mg/dL — AB (ref 0.6–1.1)
Calcium: 9.9 mg/dL (ref 8.4–10.4)
Chloride: 103 mEq/L (ref 98–109)
Glucose: 121 mg/dl (ref 70–140)
Potassium: 4.2 mEq/L (ref 3.5–5.1)
Sodium: 143 mEq/L (ref 136–145)
Total Protein: 7.6 g/dL (ref 6.4–8.3)

## 2013-10-14 LAB — CBC WITH DIFFERENTIAL/PLATELET
BASO%: 0.6 % (ref 0.0–2.0)
Basophils Absolute: 0.1 10*3/uL (ref 0.0–0.1)
EOS%: 3 % (ref 0.0–7.0)
Eosinophils Absolute: 0.3 10*3/uL (ref 0.0–0.5)
HEMATOCRIT: 37.2 % (ref 34.8–46.6)
HGB: 12.3 g/dL (ref 11.6–15.9)
LYMPH%: 17 % (ref 14.0–49.7)
MCH: 27.6 pg (ref 25.1–34.0)
MCHC: 32.9 g/dL (ref 31.5–36.0)
MCV: 83.8 fL (ref 79.5–101.0)
MONO#: 1.1 10*3/uL — ABNORMAL HIGH (ref 0.1–0.9)
MONO%: 9.7 % (ref 0.0–14.0)
NEUT#: 7.6 10*3/uL — ABNORMAL HIGH (ref 1.5–6.5)
NEUT%: 69.7 % (ref 38.4–76.8)
PLATELETS: 177 10*3/uL (ref 145–400)
RBC: 4.44 10*6/uL (ref 3.70–5.45)
RDW: 14.9 % — ABNORMAL HIGH (ref 11.2–14.5)
WBC: 10.9 10*3/uL — ABNORMAL HIGH (ref 3.9–10.3)
lymph#: 1.9 10*3/uL (ref 0.9–3.3)

## 2013-10-14 NOTE — Progress Notes (Signed)
Hematology and Oncology Follow Up Visit  [name] [id number] 10/03/1940 73 y.o. 10/14/2013 9:35 AM [name], MDJones, Laurie Right, MD   Principle Diagnosis: 73 year old woman with IgG kappa monoclonal gammopathy most likely representing MGUS without any evidence of end organ damage. This was diagnosed in August of 2014. She presented with an M spike of about 1 g/dL on a mildly elevated IgG level of 1800.  Current therapy: Observation and surveillance.  Interim History:  This is a pleasant woman returns today for a followup visit. Since her last visit, she has been doing relatively fair. She was hospitalized in November of 2014 shortness of breath and congestive heart failure. She has not had any hospitalizations since. She has diffuse bone discomfort that have been chronic in nature but her skeletal survey did not show any evidence of any bony lesions. She has not reported any opportunistic infections or pathological fractures. Her health status continue to be around baseline. No other complaints at this time but she does report occasional LE edema.  Medications: I have reviewed the patient's current medications.  Current Outpatient Prescriptions  Medication Sig Dispense Refill  . albuterol (PROVENTIL) (2.5 MG/3ML) 0.083% nebulizer solution Take 3 mLs (2.5 mg total) by nebulization every 2 (two) hours as needed for wheezing or shortness of breath.  75 mL  12  . allopurinol (ZYLOPRIM) 100 MG tablet TAKE 1 TABLET BY MOUTH DAILY FOR GOUT  90 tablet  3  . aspirin 81 MG EC tablet Take 81 mg by mouth daily.        Marland Kitchen BYSTOLIC 5 MG tablet TAKE 1 TABLET BY MOUTH DAILY  90 tablet  3  . Cholecalciferol 50000 UNITS TABS Take 1 tablet by mouth once a week.  12 tablet  3  . Dietary Management Product (VASCULERA) TABS Take 1 capsule by mouth daily.  30 tablet  11  . Fluticasone-Salmeterol (ADVAIR DISKUS) 250-50 MCG/DOSE AEPB Inhale 1 puff into the lungs 2 (two) times daily.  1 each  0  . furosemide  (LASIX) 40 MG tablet Take 1 tablet (40 mg total) by mouth daily.  30 tablet  1  . losartan (COZAAR) 100 MG tablet TAKE 1 TABLET BY MOUTH EVERY DAY  90 tablet  3  . rosuvastatin (CRESTOR) 10 MG tablet Take 1 tablet (10 mg total) by mouth daily.  90 tablet  3  . tiotropium (SPIRIVA) 18 MCG inhalation capsule Place 1 capsule (18 mcg total) into inhaler and inhale daily.  30 capsule  12  . [DISCONTINUED] olmesartan-hydrochlorothiazide (BENICAR HCT) 40-25 MG per tablet Take 1 tablet by mouth daily.  90 tablet  3   No current facility-administered medications for this visit.     Allergies:  Allergies  Allergen Reactions  . Celebrex [Celecoxib]     edema  . Enalapril Cough  . Amlodipine Besylate     REACTION: edema  . Amoxicillin     REACTION: Diarrhea  . Codeine Sulfate     REACTION: Nausea  . Hydrocodone-Acetaminophen     REACTION: Nausea    Past Medical History, Surgical history, Social history, and Family History were reviewed and updated.  Review of Systems: Remaining ROS negative. Physical Exam: Blood pressure 192/61, pulse 61, temperature 97.8 F (36.6 C), temperature source Oral, resp. rate 20, height 5' (1.524 m), weight 239 lb 8 oz (108.636 kg), SpO2 100.00%. ECOG: 1 General appearance: alert and cooperative Head: Normocephalic, without obvious abnormality, atraumatic Neck: no adenopathy, no carotid bruit, no JVD, supple, symmetrical,  trachea midline and thyroid not enlarged, symmetric, no tenderness/mass/nodules Lymph nodes: Cervical, supraclavicular, and axillary nodes normal. Heart:regular rate and rhythm, S1, S2 normal, no murmur, click, rub or gallop Lung:chest clear, no wheezing, rales, normal symmetric air entry Abdomin: soft, non-tender, without masses or organomegaly EXT:no erythema, induration, or nodules. Lateral lower extremity edema noted.   Lab Results: Lab Results  Component Value Date   WBC 10.9* 10/14/2013   HGB 12.3 10/14/2013   HCT 37.2 10/14/2013    MCV 83.8 10/14/2013   PLT 177 10/14/2013     Chemistry      Component Value Date/Time   NA 139 08/31/2013 1105   NA 142 03/20/2013 1335   K 4.7 08/31/2013 1105   K 3.9 03/20/2013 1335   CL 101 08/31/2013 1105   CO2 28 08/31/2013 1105   CO2 31* 03/20/2013 1335   BUN 40* 08/31/2013 1105   BUN 21.0 03/20/2013 1335   CREATININE 1.3* 08/31/2013 1105   CREATININE 1.1 03/20/2013 1335      Component Value Date/Time   CALCIUM 9.3 08/31/2013 1105   CALCIUM 9.9 03/20/2013 1335   ALKPHOS 90 06/01/2013 1640   ALKPHOS 87 03/20/2013 1335   AST 16 06/01/2013 1640   AST 16 03/20/2013 1335   ALT 9 06/01/2013 1640   ALT 12 03/20/2013 1335   BILITOT 0.5 06/01/2013 1640   BILITOT 0.36 03/20/2013 1335      Impression and Plan:  73 year old woman with the following issues:  1. IgG kappa monoclonal protein most likely represents MGUS without any evidence to suggest end organ damage or multiple myeloma. The natural course of this particular disease was discussed as well as the management options. I continue to observation and surveillance and followup given the fact that she is at an increased risk of developing multiple myeloma in the future. I will recheck her protein studies in 8 months and probably in 12 months after that. She develops any signs of symptoms to suggest end organ damage, I will restage her with a bone marrow biopsy at that time.  2. Diffuse pain: Under related to her plasma cell disorder more likely related to arthritis at this time.   [facility], MD 3/18/20159:35 AM

## 2013-10-14 NOTE — Telephone Encounter (Signed)
lvm for pt regarding to [facility] appt.....mailed pt appt sched/avs and letter

## 2013-10-16 ENCOUNTER — Other Ambulatory Visit: Payer: Self-pay | Admitting: Internal Medicine

## 2013-10-16 LAB — SPEP & IFE WITH QIG
ALPHA-1-GLOBULIN: 5.2 % — AB (ref 2.9–4.9)
Albumin ELP: 47.7 % — ABNORMAL LOW (ref 55.8–66.1)
Alpha-2-Globulin: 11.3 % (ref 7.1–11.8)
Beta 2: 4.4 % (ref 3.2–6.5)
Beta Globulin: 7.2 % (ref 4.7–7.2)
GAMMA GLOBULIN: 24.2 % — AB (ref 11.1–18.8)
IgA: 146 mg/dL (ref 69–380)
IgG (Immunoglobin G), Serum: 1660 mg/dL (ref 690–1700)
IgM, Serum: 51 mg/dL — ABNORMAL LOW (ref 52–322)
M-Spike, %: 1.16 g/dL
Total Protein, Serum Electrophoresis: 7.1 g/dL (ref 6.0–8.3)

## 2013-10-16 LAB — KAPPA/LAMBDA LIGHT CHAINS
KAPPA FREE LGHT CHN: 54.8 mg/dL — AB (ref 0.33–1.94)
Kappa:Lambda Ratio: 32.62 — ABNORMAL HIGH (ref 0.26–1.65)
Lambda Free Lght Chn: 1.68 mg/dL (ref 0.57–2.63)

## 2013-11-25 ENCOUNTER — Telehealth: Payer: Self-pay | Admitting: Internal Medicine

## 2013-11-25 ENCOUNTER — Ambulatory Visit (INDEPENDENT_AMBULATORY_CARE_PROVIDER_SITE_OTHER): Payer: Medicare Other | Admitting: Internal Medicine

## 2013-11-25 ENCOUNTER — Encounter: Payer: Self-pay | Admitting: Internal Medicine

## 2013-11-25 VITALS — BP 140/78 | HR 64 | Temp 97.8°F | Resp 16 | Ht 60.0 in | Wt 240.5 lb

## 2013-11-25 DIAGNOSIS — E1129 Type 2 diabetes mellitus with other diabetic kidney complication: Secondary | ICD-10-CM

## 2013-11-25 DIAGNOSIS — E785 Hyperlipidemia, unspecified: Secondary | ICD-10-CM

## 2013-11-25 DIAGNOSIS — J449 Chronic obstructive pulmonary disease, unspecified: Secondary | ICD-10-CM

## 2013-11-25 DIAGNOSIS — I509 Heart failure, unspecified: Secondary | ICD-10-CM

## 2013-11-25 DIAGNOSIS — I10 Essential (primary) hypertension: Secondary | ICD-10-CM

## 2013-11-25 DIAGNOSIS — E1165 Type 2 diabetes mellitus with hyperglycemia: Secondary | ICD-10-CM

## 2013-11-25 DIAGNOSIS — I5033 Acute on chronic diastolic (congestive) heart failure: Secondary | ICD-10-CM

## 2013-11-25 MED ORDER — ALBUTEROL SULFATE HFA 108 (90 BASE) MCG/ACT IN AERS: 1.0000 | INHALATION_SPRAY | Freq: Four times a day (QID) | RESPIRATORY_TRACT | Status: DC | PRN

## 2013-11-25 MED ORDER — ATORVASTATIN CALCIUM 40 MG PO TABS: 40.0000 mg | ORAL_TABLET | Freq: Every day | ORAL | Status: DC

## 2013-11-25 MED ORDER — METOPROLOL TARTRATE 50 MG PO TABS: 50.0000 mg | ORAL_TABLET | Freq: Two times a day (BID) | ORAL | Status: DC

## 2013-11-25 NOTE — Patient Instructions (Signed)

## 2013-11-25 NOTE — Progress Notes (Signed)
Pre visit review using our clinic review tool, if applicable. No additional management support is needed unless otherwise documented below in the visit note. 

## 2013-11-25 NOTE — Progress Notes (Signed)
   Subjective:    Patient ID: [name], female    DOB: Jan 23, 1941, 73 y.o.   MRN: [id number]  Hypertension This is a chronic problem. The current episode started more than 1 year ago. The problem is controlled. Pertinent negatives include no anxiety, blurred vision, chest pain, headaches, malaise/fatigue, neck pain, orthopnea, palpitations, peripheral edema, PND, shortness of breath or sweats. There are no associated agents to hypertension. Past treatments include diuretics, angiotensin blockers and beta blockers. The current treatment provides moderate improvement. Compliance problems include exercise, diet and medication cost.       Review of Systems  Constitutional: Negative.  Negative for fever, chills, malaise/fatigue, diaphoresis, appetite change and fatigue.  HENT: Negative.   Eyes: Negative.  Negative for blurred vision.  Respiratory: Negative.  Negative for cough, choking, chest tightness, shortness of breath, wheezing and stridor.   Cardiovascular: Negative.  Negative for chest pain, palpitations, orthopnea, leg swelling and PND.  Gastrointestinal: Negative.  Negative for nausea, vomiting, abdominal pain, diarrhea, constipation and blood in stool.  Endocrine: Negative.   Genitourinary: Negative.   Musculoskeletal: Negative.  Negative for neck pain.  Skin: Negative.   Allergic/Immunologic: Negative.   Neurological: Negative for dizziness, tremors, syncope, facial asymmetry, weakness, light-headedness, numbness and headaches.  Hematological: Negative.  Negative for adenopathy. Does not bruise/bleed easily.  Psychiatric/Behavioral: Negative.        Objective:   Physical Exam  Vitals reviewed. Constitutional: She is oriented to person, place, and time. She appears well-developed and well-nourished. No distress.  HENT:  Head: Normocephalic and atraumatic.  Mouth/Throat: Oropharynx is clear and moist. No oropharyngeal exudate.  Eyes: Conjunctivae are normal. Right eye  exhibits no discharge. Left eye exhibits no discharge. No scleral icterus.  Neck: Normal range of motion. Neck supple. No JVD present. No tracheal deviation present. No thyromegaly present.  Cardiovascular: Normal rate, regular rhythm, normal heart sounds and intact distal pulses.  Exam reveals no gallop and no friction rub.   No murmur heard. Pulmonary/Chest: Effort normal and breath sounds normal. No stridor. No respiratory distress. She has no wheezes. She has no rales. She exhibits no tenderness.  Abdominal: Soft. Bowel sounds are normal. She exhibits no distension and no mass. There is no tenderness. There is no rebound and no guarding.  Musculoskeletal: Normal range of motion. She exhibits no edema.  Lymphadenopathy:    She has no cervical adenopathy.  Neurological: She is oriented to person, place, and time.  Skin: Skin is warm and dry. No rash noted. She is not diaphoretic. No erythema. No pallor.  Psychiatric: She has a normal mood and affect. Her behavior is normal. Judgment and thought content normal.      Lab Results  Component Value Date   WBC 10.9* 10/14/2013   HGB 12.3 10/14/2013   HCT 37.2 10/14/2013   PLT 177 10/14/2013   GLUCOSE 121 10/14/2013   CHOL 105 08/31/2013   TRIG 143.0 08/31/2013   HDL 43.20 08/31/2013   LDLDIRECT 135.3 11/20/2010   LDLCALC 33 08/31/2013   ALT 13 10/14/2013   AST 17 10/14/2013   NA 143 10/14/2013   K 4.2 10/14/2013   CL 101 08/31/2013   CREATININE 1.2* 10/14/2013   BUN 31.2* 10/14/2013   CO2 30* 10/14/2013   TSH 3.39 08/31/2013   HGBA1C 6.3 08/31/2013   MICROALBUR 15.9* 02/05/2013      Assessment & Plan:

## 2013-11-25 NOTE — Assessment & Plan Note (Addendum)
meds were changed to generics at her request

## 2013-11-25 NOTE — Telephone Encounter (Signed)
Relevant patient education assigned to patient using Emmi. ° °

## 2013-11-25 NOTE — Assessment & Plan Note (Signed)
Her recent A1C shows that her blood sugars are well controlled

## 2013-11-26 ENCOUNTER — Telehealth: Payer: Self-pay

## 2013-11-26 NOTE — Telephone Encounter (Signed)
Relevant pt educational material mailed

## 2014-03-02 ENCOUNTER — Other Ambulatory Visit: Payer: Self-pay | Admitting: Internal Medicine

## 2014-03-10 ENCOUNTER — Telehealth: Payer: Self-pay

## 2014-03-10 NOTE — Telephone Encounter (Signed)
Called patient //LMOVM advising to call back and schedule appointment with PCP for diabetic bundle.

## 2014-04-01 ENCOUNTER — Other Ambulatory Visit: Payer: Self-pay | Admitting: Internal Medicine

## 2014-04-14 ENCOUNTER — Encounter: Payer: Self-pay | Admitting: Internal Medicine

## 2014-04-14 ENCOUNTER — Ambulatory Visit (INDEPENDENT_AMBULATORY_CARE_PROVIDER_SITE_OTHER): Payer: Medicare Other | Admitting: Internal Medicine

## 2014-04-14 ENCOUNTER — Other Ambulatory Visit (INDEPENDENT_AMBULATORY_CARE_PROVIDER_SITE_OTHER): Payer: Medicare Other

## 2014-04-14 ENCOUNTER — Other Ambulatory Visit: Payer: Self-pay | Admitting: Internal Medicine

## 2014-04-14 VITALS — BP 130/68 | HR 57 | Temp 98.2°F | Resp 16 | Ht 60.0 in | Wt 243.0 lb

## 2014-04-14 DIAGNOSIS — J449 Chronic obstructive pulmonary disease, unspecified: Secondary | ICD-10-CM

## 2014-04-14 DIAGNOSIS — K219 Gastro-esophageal reflux disease without esophagitis: Secondary | ICD-10-CM

## 2014-04-14 DIAGNOSIS — D472 Monoclonal gammopathy: Secondary | ICD-10-CM

## 2014-04-14 DIAGNOSIS — K5150 Left sided colitis without complications: Secondary | ICD-10-CM

## 2014-04-14 DIAGNOSIS — I10 Essential (primary) hypertension: Secondary | ICD-10-CM

## 2014-04-14 DIAGNOSIS — N259 Disorder resulting from impaired renal tubular function, unspecified: Secondary | ICD-10-CM

## 2014-04-14 DIAGNOSIS — E785 Hyperlipidemia, unspecified: Secondary | ICD-10-CM

## 2014-04-14 DIAGNOSIS — E1129 Type 2 diabetes mellitus with other diabetic kidney complication: Secondary | ICD-10-CM

## 2014-04-14 DIAGNOSIS — E669 Obesity, unspecified: Secondary | ICD-10-CM

## 2014-04-14 DIAGNOSIS — E1165 Type 2 diabetes mellitus with hyperglycemia: Principal | ICD-10-CM

## 2014-04-14 LAB — HEMOGLOBIN A1C: Hgb A1c MFr Bld: 6.5 % (ref 4.6–6.5)

## 2014-04-14 MED ORDER — FLUTICASONE FUROATE-VILANTEROL 100-25 MCG/INH IN AEPB: 1.0000 | INHALATION_SPRAY | Freq: Every day | RESPIRATORY_TRACT | Status: DC

## 2014-04-14 NOTE — Patient Instructions (Signed)

## 2014-04-15 LAB — CBC WITH DIFFERENTIAL/PLATELET
BASOS ABS: 0 10*3/uL (ref 0.0–0.1)
Basophils Relative: 0.3 % (ref 0.0–3.0)
Eosinophils Absolute: 0.4 10*3/uL (ref 0.0–0.7)
Eosinophils Relative: 3.6 % (ref 0.0–5.0)
HCT: 36.6 % (ref 36.0–46.0)
HEMOGLOBIN: 12.2 g/dL (ref 12.0–15.0)
LYMPHS PCT: 18.9 % (ref 12.0–46.0)
Lymphs Abs: 2.3 10*3/uL (ref 0.7–4.0)
MCHC: 33.3 g/dL (ref 30.0–36.0)
MCV: 82.3 fl (ref 78.0–100.0)
MONOS PCT: 10.8 % (ref 3.0–12.0)
Monocytes Absolute: 1.3 10*3/uL — ABNORMAL HIGH (ref 0.1–1.0)
NEUTROS ABS: 7.9 10*3/uL — AB (ref 1.4–7.7)
NEUTROS PCT: 66.4 % (ref 43.0–77.0)
Platelets: 202 10*3/uL (ref 150.0–400.0)
RBC: 4.44 Mil/uL (ref 3.87–5.11)
RDW: 15.7 % — AB (ref 11.5–15.5)
WBC: 11.9 10*3/uL — ABNORMAL HIGH (ref 4.0–10.5)

## 2014-04-15 LAB — CK: CK TOTAL: 55 U/L (ref 7–177)

## 2014-04-15 LAB — COMPREHENSIVE METABOLIC PANEL
ALBUMIN: 3.7 g/dL (ref 3.5–5.2)
ALT: 15 U/L (ref 0–35)
AST: 19 U/L (ref 0–37)
Alkaline Phosphatase: 81 U/L (ref 39–117)
BUN: 33 mg/dL — ABNORMAL HIGH (ref 6–23)
CALCIUM: 9.6 mg/dL (ref 8.4–10.5)
CHLORIDE: 100 meq/L (ref 96–112)
CO2: 33 meq/L — AB (ref 19–32)
CREATININE: 1.4 mg/dL — AB (ref 0.4–1.2)
GFR: 40.09 mL/min — AB (ref >60.00)
Glucose, Bld: 104 mg/dL — ABNORMAL HIGH (ref 70–99)
POTASSIUM: 4.5 meq/L (ref 3.5–5.1)
Sodium: 138 mEq/L (ref 135–145)
TOTAL PROTEIN: 8.1 g/dL (ref 6.0–8.3)
Total Bilirubin: 0.5 mg/dL (ref 0.2–1.2)

## 2014-04-15 LAB — TSH: TSH: 3.68 u[IU]/mL (ref 0.35–4.50)

## 2014-04-16 ENCOUNTER — Encounter: Payer: Self-pay | Admitting: Internal Medicine

## 2014-04-16 LAB — SPEP & IFE WITH QIG
ALBUMIN ELP: 47.9 % — AB (ref 55.8–66.1)
ALPHA-1-GLOBULIN: 5.2 % — AB (ref 2.9–4.9)
ALPHA-2-GLOBULIN: 11.6 % (ref 7.1–11.8)
Beta 2: 3.9 % (ref 3.2–6.5)
Beta Globulin: 6.9 % (ref 4.7–7.2)
Gamma Globulin: 24.5 % — ABNORMAL HIGH (ref 11.1–18.8)
IGM, SERUM: 57 mg/dL (ref 52–322)
IgA: 151 mg/dL (ref 69–380)
IgG (Immunoglobin G), Serum: 1960 mg/dL — ABNORMAL HIGH (ref 690–1700)
M-Spike, %: 1.22 g/dL
TOTAL PROTEIN, SERUM ELECTROPHOR: 7.6 g/dL (ref 6.0–8.3)

## 2014-04-16 NOTE — Assessment & Plan Note (Signed)
Her BP is well controlled Her lytes and renal function are stable 

## 2014-04-16 NOTE — Assessment & Plan Note (Signed)
SPEP shows that this hs not changed over the last 6 months She will cont to f/up with Heme about this

## 2014-04-16 NOTE — Assessment & Plan Note (Signed)
Her blood sugars are well controlled She will cont with her lifestyle modifications

## 2014-04-16 NOTE — Assessment & Plan Note (Signed)
She tells me that advair and spiriva are too expensive I gave her samples of breo

## 2014-04-16 NOTE — Progress Notes (Signed)
Subjective:    Patient ID: [name], female    DOB: June 20, 1941, 73 y.o.   MRN: [id number]  Hypertension This is a chronic problem. The current episode started more than 1 year ago. The problem has been gradually improving since onset. The problem is controlled. Associated symptoms include malaise/fatigue and peripheral edema. Pertinent negatives include no anxiety, blurred vision, chest pain, headaches, neck pain, orthopnea, palpitations, PND, shortness of breath or sweats. There are no associated agents to hypertension. Past treatments include diuretics, angiotensin blockers and beta blockers. The current treatment provides moderate improvement. Compliance problems include diet and exercise.  Hypertensive end-organ damage includes kidney disease. Identifiable causes of hypertension include chronic renal disease and sleep apnea.      Review of Systems  Constitutional: Positive for malaise/fatigue and fatigue. Negative for fever, chills, diaphoresis and appetite change.  HENT: Negative.   Eyes: Negative.  Negative for blurred vision.  Respiratory: Positive for apnea. Negative for cough, choking, chest tightness, shortness of breath and stridor.   Cardiovascular: Negative.  Negative for chest pain, palpitations, orthopnea, leg swelling and PND.  Gastrointestinal: Negative.  Negative for nausea, vomiting, abdominal pain, diarrhea, constipation and blood in stool.  Endocrine: Negative.  Negative for polydipsia, polyphagia and polyuria.  Genitourinary: Negative.  Negative for dysuria, urgency, hematuria, decreased urine volume, enuresis and difficulty urinating.  Musculoskeletal: Negative.  Negative for arthralgias, back pain, joint swelling, neck pain and neck stiffness.  Skin: Negative.  Negative for rash.  Allergic/Immunologic: Negative.   Neurological: Negative.  Negative for headaches.  Hematological: Negative.  Negative for adenopathy. Does not bruise/bleed easily.    Psychiatric/Behavioral: Negative.        Objective:   Physical Exam  Vitals reviewed. Constitutional: She is oriented to person, place, and time. She appears well-developed and well-nourished. No distress.  HENT:  Head: Normocephalic and atraumatic.  Mouth/Throat: Oropharynx is clear and moist. No oropharyngeal exudate.  Eyes: Conjunctivae are normal. Right eye exhibits no discharge. Left eye exhibits no discharge. No scleral icterus.  Neck: Normal range of motion. Neck supple. No JVD present. No tracheal deviation present. No thyromegaly present.  Cardiovascular: Normal rate, regular rhythm, normal heart sounds and intact distal pulses.  Exam reveals no gallop and no friction rub.   No murmur heard. Pulmonary/Chest: Effort normal and breath sounds normal. No stridor. No respiratory distress. She has no wheezes. She has no rales. She exhibits no tenderness.  Abdominal: Soft. Bowel sounds are normal. She exhibits no distension and no mass. There is no tenderness. There is no rebound and no guarding.  Musculoskeletal: Normal range of motion. She exhibits edema (1+ pitting edema in BLE). She exhibits no tenderness.  Lymphadenopathy:    She has no cervical adenopathy.  Neurological: She is oriented to person, place, and time.  Skin: Skin is warm and dry. No rash noted. She is not diaphoretic. No erythema. No pallor.     Lab Results  Component Value Date   WBC 11.9* 04/14/2014   HGB 12.2 04/14/2014   HCT 36.6 04/14/2014   PLT 202.0 04/14/2014   GLUCOSE 104* 04/14/2014   CHOL 105 08/31/2013   TRIG 143.0 08/31/2013   HDL 43.20 08/31/2013   LDLDIRECT 135.3 11/20/2010   LDLCALC 33 08/31/2013   ALT 15 04/14/2014   AST 19 04/14/2014   NA 138 04/14/2014   K 4.5 04/14/2014   CL 100 04/14/2014   CREATININE 1.4* 04/14/2014   BUN 33* 04/14/2014   CO2 33* 04/14/2014   TSH  3.68 04/14/2014   HGBA1C 6.5 04/14/2014   MICROALBUR 15.9* 02/05/2013       Assessment & Plan:

## 2014-04-22 ENCOUNTER — Ambulatory Visit (INDEPENDENT_AMBULATORY_CARE_PROVIDER_SITE_OTHER)
Admission: RE | Admit: 2014-04-22 | Discharge: 2014-04-22 | Disposition: A | Discharge status: Home / Self Care | Payer: Medicare Other | Source: Ambulatory Visit | Attending: Internal Medicine | Admitting: Internal Medicine

## 2014-04-22 ENCOUNTER — Encounter: Payer: Self-pay | Admitting: Internal Medicine

## 2014-04-22 ENCOUNTER — Other Ambulatory Visit (INDEPENDENT_AMBULATORY_CARE_PROVIDER_SITE_OTHER): Payer: Medicare Other

## 2014-04-22 ENCOUNTER — Ambulatory Visit (INDEPENDENT_AMBULATORY_CARE_PROVIDER_SITE_OTHER): Payer: Medicare Other | Admitting: Internal Medicine

## 2014-04-22 VITALS — BP 120/70 | HR 68 | Temp 98.2°F | Resp 16 | Ht 60.0 in | Wt 244.0 lb

## 2014-04-22 DIAGNOSIS — R109 Unspecified abdominal pain: Secondary | ICD-10-CM

## 2014-04-22 DIAGNOSIS — Z23 Encounter for immunization: Secondary | ICD-10-CM

## 2014-04-22 DIAGNOSIS — R52 Pain, unspecified: Secondary | ICD-10-CM

## 2014-04-22 LAB — URINALYSIS, ROUTINE W REFLEX MICROSCOPIC
Bilirubin Urine: NEGATIVE
HGB URINE DIPSTICK: NEGATIVE
Ketones, ur: NEGATIVE
Leukocytes, UA: NEGATIVE
Nitrite: NEGATIVE
RBC / HPF: NONE SEEN (ref 0–?)
Specific Gravity, Urine: <=1.005 — AB (ref 1.000–1.030)
Total Protein, Urine: NEGATIVE
URINE GLUCOSE: NEGATIVE
Urobilinogen, UA: 0.2 (ref 0.0–1.0)
WBC UA: NONE SEEN (ref 0–?)
pH: 6 (ref 5.0–8.0)

## 2014-04-22 MED ORDER — KETOCONAZOLE 2 % EX CREA: 1.0000 "application " | TOPICAL_CREAM | Freq: Two times a day (BID) | CUTANEOUS | Status: DC

## 2014-04-22 NOTE — Progress Notes (Signed)
Pre visit review using our clinic review tool, if applicable. No additional management support is needed unless otherwise documented below in the visit note. 

## 2014-04-22 NOTE — Patient Instructions (Signed)
Flank Pain °Flank pain refers to pain that is located on the side of the body between the upper abdomen and the back. The pain may occur over a short period of time (acute) or may be long-term or reoccurring (chronic). It may be mild or severe. Flank pain can be caused by many things. °CAUSES  °Some of the more common causes of flank pain include: °· Muscle strains.   °· Muscle spasms.   °· A disease of your spine (vertebral disk disease).   °· A lung infection (pneumonia).   °· Fluid around your lungs (pulmonary edema).   °· A kidney infection.   °· Kidney stones.   °· A very painful skin rash caused by the chickenpox virus (shingles).   °· Gallbladder disease.   °HOME CARE INSTRUCTIONS  °Home care will depend on the cause of your pain. In general, °· Rest as directed by your caregiver. °· Drink enough fluids to keep your urine clear or pale yellow. °· Only take over-the-counter or prescription medicines as directed by your caregiver. Some medicines may help relieve the pain. °· Tell your caregiver about any changes in your pain. °· Follow up with your caregiver as directed. °SEEK IMMEDIATE MEDICAL CARE IF:  °· Your pain is not controlled with medicine.   °· You have new or worsening symptoms. °· Your pain increases.   °· You have abdominal pain.   °· You have shortness of breath.   °· You have persistent nausea or vomiting.   °· You have swelling in your abdomen.   °· You feel faint or pass out.   °· You have blood in your urine. °· You have a fever or persistent symptoms for more than 2-3 days. °· You have a fever and your symptoms suddenly get worse. °MAKE SURE YOU:  °· Understand these instructions. °· Will watch your condition. °· Will get help right away if you are not doing well or get worse. °Document Released: 09/06/2005 Document Revised: 04/09/2012 Document Reviewed: 02/28/2012 °[employer]® Patient Information ©2015 [employer]. This information is not intended to replace advice given to you by your  health care provider. Make sure you discuss any questions you have with your health care provider. ° °

## 2014-04-22 NOTE — Progress Notes (Signed)
Subjective:    Patient ID: [name], female    DOB: 11-Nov-1940, 73 y.o.   MRN: [id number]  Flank Pain This is a new problem. The current episode started in the past 7 days. The problem occurs intermittently. The problem is unchanged. Pain location: right flank and lower back. The quality of the pain is described as aching ("dull ache that throbs at times with movement"). The pain does not radiate. The pain is at a severity of 3/10. The pain is mild. The pain is worse during the day. The symptoms are aggravated by twisting and position. Pertinent negatives include no abdominal pain, bladder incontinence, bowel incontinence, chest pain, dysuria, fever, headaches, leg pain, numbness, paresis, paresthesias, pelvic pain, perianal numbness, tingling, weakness or weight loss. Risk factors include obesity and lack of exercise. She has tried analgesics and NSAIDs for the symptoms. The treatment provided moderate relief.      Review of Systems  Constitutional: Negative.  Negative for fever, chills, weight loss, diaphoresis, appetite change and fatigue.  HENT: Negative.   Eyes: Negative.   Respiratory: Negative.  Negative for cough, choking, chest tightness, shortness of breath and stridor.   Cardiovascular: Negative.  Negative for chest pain, palpitations and leg swelling.  Gastrointestinal: Negative.  Negative for nausea, vomiting, abdominal pain, diarrhea, constipation, blood in stool and bowel incontinence.  Endocrine: Negative.   Genitourinary: Positive for flank pain. Negative for bladder incontinence, dysuria, urgency, frequency, hematuria, decreased urine volume, vaginal bleeding, vaginal discharge, enuresis, difficulty urinating, genital sores, vaginal pain, menstrual problem, pelvic pain and dyspareunia.  Musculoskeletal: Positive for back pain. Negative for arthralgias, gait problem, joint swelling, myalgias, neck pain and neck stiffness.  Skin: Negative.  Negative for rash.    Allergic/Immunologic: Negative.   Neurological: Negative.  Negative for tingling, weakness, numbness, headaches and paresthesias.  Hematological: Negative.  Negative for adenopathy. Does not bruise/bleed easily.  Psychiatric/Behavioral: Negative.        Objective:   Physical Exam  Vitals reviewed. Constitutional: She is oriented to person, place, and time. She appears well-developed and well-nourished.  Non-toxic appearance. She does not have a sickly appearance. She does not appear ill. No distress.  HENT:  Head: Normocephalic and atraumatic.  Mouth/Throat: Oropharynx is clear and moist. No oropharyngeal exudate.  Eyes: Conjunctivae are normal. Right eye exhibits no discharge. Left eye exhibits no discharge. No scleral icterus.  Neck: Normal range of motion. Neck supple. No JVD present. No tracheal deviation present. No thyromegaly present.  Cardiovascular: Normal rate, regular rhythm, normal heart sounds and intact distal pulses.  Exam reveals no gallop and no friction rub.   No murmur heard. Pulmonary/Chest: Effort normal and breath sounds normal. No stridor. No respiratory distress. She has no wheezes. She has no rales. She exhibits no tenderness.  Abdominal: Soft. Normal appearance and bowel sounds are normal. She exhibits no shifting dullness, no distension, no pulsatile liver, no fluid wave, no abdominal bruit, no ascites, no pulsatile midline mass and no mass. There is hepatomegaly. There is no hepatosplenomegaly or splenomegaly. There is no tenderness. There is no rebound, no guarding and no CVA tenderness. No hernia. Hernia confirmed negative in the ventral area.    Musculoskeletal: Normal range of motion. She exhibits no edema and no tenderness.       Lumbar back: Normal. She exhibits normal range of motion, no tenderness, no bony tenderness, no swelling, no edema, no deformity, no laceration, no pain, no spasm and normal pulse.  Lymphadenopathy:    She  has no cervical  adenopathy.  Neurological: She is oriented to person, place, and time.  Skin: Skin is warm and dry. No rash noted. She is not diaphoretic. No erythema. No pallor.  Psychiatric: She has a normal mood and affect. Her behavior is normal. Judgment and thought content normal.     Lab Results  Component Value Date   WBC 11.9* 04/14/2014   HGB 12.2 04/14/2014   HCT 36.6 04/14/2014   PLT 202.0 04/14/2014   GLUCOSE 104* 04/14/2014   CHOL 105 08/31/2013   TRIG 143.0 08/31/2013   HDL 43.20 08/31/2013   LDLDIRECT 135.3 11/20/2010   LDLCALC 33 08/31/2013   ALT 15 04/14/2014   AST 19 04/14/2014   NA 138 04/14/2014   K 4.5 04/14/2014   CL 100 04/14/2014   CREATININE 1.4* 04/14/2014   BUN 33* 04/14/2014   CO2 33* 04/14/2014   TSH 3.68 04/14/2014   HGBA1C 6.5 04/14/2014   MICROALBUR 15.9* 02/05/2013       Assessment & Plan:

## 2014-04-23 LAB — CULTURE, URINE COMPREHENSIVE
COLONY COUNT: NO GROWTH
ORGANISM ID, BACTERIA: NO GROWTH

## 2014-04-25 NOTE — Assessment & Plan Note (Signed)
Plain films and UA are normal I think this is MS Will cont current meds for pain Will follow for symptom resolution

## 2014-05-28 ENCOUNTER — Telehealth: Payer: Self-pay | Admitting: Oncology

## 2014-05-28 NOTE — Telephone Encounter (Signed)
Lvm advising appt chg from 11/18 to 12/9 2 2.30p. Also mailed appt calendar.

## 2014-06-16 ENCOUNTER — Other Ambulatory Visit: Payer: Medicare Other

## 2014-06-16 ENCOUNTER — Ambulatory Visit: Payer: Medicare Other | Admitting: Oncology

## 2014-07-07 ENCOUNTER — Other Ambulatory Visit (HOSPITAL_BASED_OUTPATIENT_CLINIC_OR_DEPARTMENT_OTHER): Payer: Medicare Other

## 2014-07-07 ENCOUNTER — Ambulatory Visit (HOSPITAL_BASED_OUTPATIENT_CLINIC_OR_DEPARTMENT_OTHER): Payer: Medicare Other | Admitting: Oncology

## 2014-07-07 ENCOUNTER — Telehealth: Payer: Self-pay | Admitting: Oncology

## 2014-07-07 VITALS — BP 191/48 | HR 67 | Temp 98.5°F | Resp 18 | Ht 60.0 in | Wt 244.3 lb

## 2014-07-07 DIAGNOSIS — R52 Pain, unspecified: Secondary | ICD-10-CM

## 2014-07-07 DIAGNOSIS — D472 Monoclonal gammopathy: Secondary | ICD-10-CM

## 2014-07-07 LAB — COMPREHENSIVE METABOLIC PANEL (CC13)
ALBUMIN: 3.2 g/dL — AB (ref 3.5–5.0)
ALT: 13 U/L (ref 0–55)
ANION GAP: 10 meq/L (ref 3–11)
AST: 14 U/L (ref 5–34)
Alkaline Phosphatase: 90 U/L (ref 40–150)
BUN: 35.4 mg/dL — AB (ref 7.0–26.0)
CALCIUM: 9.6 mg/dL (ref 8.4–10.4)
CHLORIDE: 104 meq/L (ref 98–109)
CO2: 29 meq/L (ref 22–29)
Creatinine: 1.6 mg/dL — ABNORMAL HIGH (ref 0.6–1.1)
EGFR: 32 mL/min/{1.73_m2} — ABNORMAL LOW (ref >90)
Glucose: 125 mg/dl (ref 70–140)
POTASSIUM: 4 meq/L (ref 3.5–5.1)
Sodium: 142 mEq/L (ref 136–145)
Total Bilirubin: 0.25 mg/dL (ref 0.20–1.20)
Total Protein: 7.1 g/dL (ref 6.4–8.3)

## 2014-07-07 LAB — CBC WITH DIFFERENTIAL/PLATELET
BASO%: 0.2 % (ref 0.0–2.0)
BASOS ABS: 0 10*3/uL (ref 0.0–0.1)
EOS%: 2.9 % (ref 0.0–7.0)
Eosinophils Absolute: 0.3 10*3/uL (ref 0.0–0.5)
HEMATOCRIT: 34.6 % — AB (ref 34.8–46.6)
HEMOGLOBIN: 10.9 g/dL — AB (ref 11.6–15.9)
LYMPH#: 2.3 10*3/uL (ref 0.9–3.3)
LYMPH%: 19.1 % (ref 14.0–49.7)
MCH: 26.9 pg (ref 25.1–34.0)
MCHC: 31.5 g/dL (ref 31.5–36.0)
MCV: 85.4 fL (ref 79.5–101.0)
MONO#: 0.8 10*3/uL (ref 0.1–0.9)
MONO%: 7 % (ref 0.0–14.0)
NEUT#: 8.4 10*3/uL — ABNORMAL HIGH (ref 1.5–6.5)
NEUT%: 70.8 % (ref 38.4–76.8)
Platelets: 200 10*3/uL (ref 145–400)
RBC: 4.05 10*6/uL (ref 3.70–5.45)
RDW: 15.2 % — AB (ref 11.2–14.5)
WBC: 11.9 10*3/uL — ABNORMAL HIGH (ref 3.9–10.3)

## 2014-07-07 LAB — TECHNOLOGIST REVIEW

## 2014-07-07 NOTE — Telephone Encounter (Signed)
Pt confirmed labs/ov per 12/09 POF, gave pt AVS.... KJ

## 2014-07-07 NOTE — Progress Notes (Signed)
Hematology and Oncology Follow Up Visit  [name] [id number] 07/23/1941 73 y.o. 07/07/2014 3:30 PM [name], MDJones, Arvid Right, MD   Principle Diagnosis: 73 year old woman with IgG kappa monoclonal gammopathy most likely representing MGUS without any evidence of end organ damage. This was diagnosed in August of 2014. She presented with an M spike of about 1 g/dL on a mildly elevated IgG level of 1800.  Current therapy: Observation and surveillance.  Interim History: [name] presents today for a followup visit. Since her last visit, she has been doing relatively fair. She has not had any hospitalizations since November 2014. She has diffuse bone discomfort that have been chronic in nature but her skeletal survey did not show any evidence of any bony lesions. She has not reported any opportunistic infections or pathological fractures. Her health status continue to be around baseline. She does not report any headaches or blurry vision or syncope. Does not report any chest pain or difficulty breathing. She has not reported any nausea, vomiting or abdominal pain. Has not reported any urinary complaints. Rest of her review of systems unremarkable.  Medications: I have reviewed the patient's current medications.  Current Outpatient Prescriptions  Medication Sig Dispense Refill  . albuterol (PROAIR HFA) 108 (90 BASE) MCG/ACT inhaler Inhale 1-2 puffs into the lungs every 6 (six) hours as needed for wheezing or shortness of breath. 18 g 11  . allopurinol (ZYLOPRIM) 100 MG tablet TAKE 1 TABLET BY MOUTH DAILY FOR GOUT 90 tablet 3  . aspirin 81 MG EC tablet Take 81 mg by mouth daily.      . Dietary Management Product (VASCULERA) TABS TAKE 1 TABLET BY MOUTH EVERY DAY. 30 tablet 11  . Fluticasone Furoate-Vilanterol (BREO ELLIPTA) 100-25 MCG/INH AEPB Inhale 1 puff into the lungs daily. 30 each 11  . furosemide (LASIX) 40 MG tablet TAKE 1 TABLET BY MOUTH TWICE DAILY 180 tablet 1  . ketoconazole  (NIZORAL) 2 % cream Apply 1 application topically 2 (two) times daily. 60 g 5  . losartan (COZAAR) 100 MG tablet TAKE 1 TABLET BY MOUTH EVERY DAY 90 tablet 3  . metoprolol (LOPRESSOR) 50 MG tablet Take 1 tablet (50 mg total) by mouth 2 (two) times daily. 180 tablet 3  . Vitamin D, Ergocalciferol, (DRISDOL) 50000 UNITS CAPS capsule TAKE 1 CAPSULE BY MOUTH ONCE A WEEK 12 capsule 3  . [DISCONTINUED] olmesartan-hydrochlorothiazide (BENICAR HCT) 40-25 MG per tablet Take 1 tablet by mouth daily. 90 tablet 3   No current facility-administered medications for this visit.     Allergies:  Allergies  Allergen Reactions  . Celebrex [Celecoxib]     edema  . Enalapril Cough  . Lipitor [Atorvastatin]     Muscle aches  . Amlodipine Besylate     REACTION: edema  . Amoxicillin     REACTION: Diarrhea  . Codeine Sulfate     REACTION: Nausea  . Hydrocodone-Acetaminophen     REACTION: Nausea    Past Medical History, Surgical history, Social history, and Family History were reviewed and updated.  Review of Systems: Remaining ROS negative. Physical Exam: Blood pressure 191/48, pulse 67, temperature 98.5 F (36.9 C), temperature source Oral, resp. rate 18, height 5' (1.524 m), weight 244 lb 4.8 oz (110.814 kg). ECOG: 1 General appearance: alert and cooperative Head: Normocephalic, without obvious abnormality, atraumatic Neck: no adenopathy Lymph nodes: Cervical, supraclavicular, and axillary nodes normal. Heart:regular rate and rhythm, S1, S2 normal, no murmur, click, rub or gallop Lung:chest clear, no wheezing,  rales, normal symmetric air entry Abdomin: soft, non-tender, without masses or organomegaly EXT:no erythema, induration, or nodules. Lateral lower extremity edema noted.   Lab Results: Lab Results  Component Value Date   WBC 11.9* 07/07/2014   HGB 10.9* 07/07/2014   HCT 34.6* 07/07/2014   MCV 85.4 07/07/2014   PLT 200 07/07/2014     Chemistry      Component Value Date/Time    NA 138 04/14/2014 1202   NA 143 10/14/2013 0906   K 4.5 04/14/2014 1202   K 4.2 10/14/2013 0906   CL 100 04/14/2014 1202   CO2 33* 04/14/2014 1202   CO2 30* 10/14/2013 0906   BUN 33* 04/14/2014 1202   BUN 31.2* 10/14/2013 0906   CREATININE 1.4* 04/14/2014 1202   CREATININE 1.2* 10/14/2013 0906      Component Value Date/Time   CALCIUM 9.6 04/14/2014 1202   CALCIUM 9.9 10/14/2013 0906   ALKPHOS 81 04/14/2014 1202   ALKPHOS 81 10/14/2013 0906   AST 19 04/14/2014 1202   AST 17 10/14/2013 0906   ALT 15 04/14/2014 1202   ALT 13 10/14/2013 0906   BILITOT 0.5 04/14/2014 1202   BILITOT 0.35 10/14/2013 0906      Results for [name], [name] (MRN [id number]) as of 07/07/2014 15:32  Ref. Range 10/14/2013 09:06 04/14/2014 12:02  M-SPIKE, % No range found 1.16 1.22  SPE Interp. No range found * SEE NOTE  IgG (Immunoglobin G), Serum Latest Range: [id number] mg/dL 1660 1960 (H)  IgA Latest Range: 69-380 mg/dL 146 151     Impression and Plan:  73 year old woman with the following issues:  1. IgG kappa monoclonal protein most likely represents MGUS without any evidence to suggest end organ damage or multiple myeloma.  Her protein studies changed very little in the last 6 months with an M spike is continues to be about 1 g/dL. I see no clear-cut evidence of end organ damage at this time. However, she is at risk of developing symptomatic myeloma and we need to continue to follow her closely. I will repeat her protein studies in 6-9 months sooner if she develops any other symptomatology.  2. Diffuse pain: Under related to her plasma cell disorder more likely related to arthritis at this time.   KGOVPC,HEKBT, MD 12/9/20153:30 PM

## 2014-07-09 LAB — KAPPA/LAMBDA LIGHT CHAINS
KAPPA LAMBDA RATIO: 41 — AB (ref 0.26–1.65)
Kappa free light chain: 69.7 mg/dL — ABNORMAL HIGH (ref 0.33–1.94)
LAMBDA FREE LGHT CHN: 1.7 mg/dL (ref 0.57–2.63)

## 2014-07-09 LAB — SPEP & IFE WITH QIG
ALBUMIN ELP: 50.2 % — AB (ref 55.8–66.1)
ALPHA-1-GLOBULIN: 5.2 % — AB (ref 2.9–4.9)
ALPHA-2-GLOBULIN: 10.8 % (ref 7.1–11.8)
BETA GLOBULIN: 6.7 % (ref 4.7–7.2)
Beta 2: 3.9 % (ref 3.2–6.5)
Gamma Globulin: 23.2 % — ABNORMAL HIGH (ref 11.1–18.8)
IgA: 130 mg/dL (ref 69–380)
IgG (Immunoglobin G), Serum: 1790 mg/dL — ABNORMAL HIGH (ref 690–1700)
IgM, Serum: 47 mg/dL — ABNORMAL LOW (ref 52–322)
M-Spike, %: 1.07 g/dL
Total Protein, Serum Electrophoresis: 7.2 g/dL (ref 6.0–8.3)

## 2014-07-28 ENCOUNTER — Other Ambulatory Visit: Payer: Self-pay | Admitting: Internal Medicine

## 2014-11-16 ENCOUNTER — Inpatient Hospital Stay (HOSPITAL_COMMUNITY)
Admission: EM | Admit: 2014-11-16 | Discharge: 2014-11-23 | DRG: 291 | Disposition: A | Discharge status: Home Health | Payer: Medicare Other | Attending: Internal Medicine | Admitting: Internal Medicine

## 2014-11-16 ENCOUNTER — Emergency Department (HOSPITAL_COMMUNITY): Payer: Medicare Other

## 2014-11-16 ENCOUNTER — Encounter: Payer: Self-pay | Admitting: Internal Medicine

## 2014-11-16 ENCOUNTER — Encounter (HOSPITAL_COMMUNITY): Payer: Self-pay | Admitting: *Deleted

## 2014-11-16 ENCOUNTER — Ambulatory Visit (INDEPENDENT_AMBULATORY_CARE_PROVIDER_SITE_OTHER): Payer: Medicare Other | Admitting: Internal Medicine

## 2014-11-16 VITALS — BP 178/72 | HR 106 | Temp 98.5°F | Resp 24 | Wt 235.0 lb

## 2014-11-16 DIAGNOSIS — N183 Chronic kidney disease, stage 3 (moderate): Secondary | ICD-10-CM | POA: Diagnosis not present

## 2014-11-16 DIAGNOSIS — D472 Monoclonal gammopathy: Secondary | ICD-10-CM | POA: Diagnosis present

## 2014-11-16 DIAGNOSIS — I129 Hypertensive chronic kidney disease with stage 1 through stage 4 chronic kidney disease, or unspecified chronic kidney disease: Secondary | ICD-10-CM | POA: Diagnosis present

## 2014-11-16 DIAGNOSIS — Z66 Do not resuscitate: Secondary | ICD-10-CM | POA: Diagnosis not present

## 2014-11-16 DIAGNOSIS — I83219 Varicose veins of right lower extremity with both ulcer of unspecified site and inflammation: Secondary | ICD-10-CM | POA: Diagnosis not present

## 2014-11-16 DIAGNOSIS — J9602 Acute respiratory failure with hypercapnia: Secondary | ICD-10-CM | POA: Diagnosis present

## 2014-11-16 DIAGNOSIS — D638 Anemia in other chronic diseases classified elsewhere: Secondary | ICD-10-CM | POA: Diagnosis not present

## 2014-11-16 DIAGNOSIS — T380X5A Adverse effect of glucocorticoids and synthetic analogues, initial encounter: Secondary | ICD-10-CM | POA: Diagnosis not present

## 2014-11-16 DIAGNOSIS — Z8249 Family history of ischemic heart disease and other diseases of the circulatory system: Secondary | ICD-10-CM | POA: Diagnosis not present

## 2014-11-16 DIAGNOSIS — R0600 Dyspnea, unspecified: Secondary | ICD-10-CM

## 2014-11-16 DIAGNOSIS — R739 Hyperglycemia, unspecified: Secondary | ICD-10-CM | POA: Diagnosis not present

## 2014-11-16 DIAGNOSIS — J449 Chronic obstructive pulmonary disease, unspecified: Secondary | ICD-10-CM

## 2014-11-16 DIAGNOSIS — J9601 Acute respiratory failure with hypoxia: Secondary | ICD-10-CM | POA: Diagnosis not present

## 2014-11-16 DIAGNOSIS — M109 Gout, unspecified: Secondary | ICD-10-CM | POA: Diagnosis present

## 2014-11-16 DIAGNOSIS — Z9981 Dependence on supplemental oxygen: Secondary | ICD-10-CM | POA: Diagnosis not present

## 2014-11-16 DIAGNOSIS — R7309 Other abnormal glucose: Secondary | ICD-10-CM | POA: Diagnosis present

## 2014-11-16 DIAGNOSIS — E669 Obesity, unspecified: Secondary | ICD-10-CM | POA: Diagnosis present

## 2014-11-16 DIAGNOSIS — R0602 Shortness of breath: Secondary | ICD-10-CM | POA: Diagnosis present

## 2014-11-16 DIAGNOSIS — Z833 Family history of diabetes mellitus: Secondary | ICD-10-CM | POA: Diagnosis not present

## 2014-11-16 DIAGNOSIS — Z79899 Other long term (current) drug therapy: Secondary | ICD-10-CM

## 2014-11-16 DIAGNOSIS — J211 Acute bronchiolitis due to human metapneumovirus: Secondary | ICD-10-CM | POA: Diagnosis present

## 2014-11-16 DIAGNOSIS — I83009 Varicose veins of unspecified lower extremity with ulcer of unspecified site: Secondary | ICD-10-CM

## 2014-11-16 DIAGNOSIS — G4733 Obstructive sleep apnea (adult) (pediatric): Secondary | ICD-10-CM | POA: Diagnosis present

## 2014-11-16 DIAGNOSIS — E785 Hyperlipidemia, unspecified: Secondary | ICD-10-CM | POA: Diagnosis present

## 2014-11-16 DIAGNOSIS — I7090 Unspecified atherosclerosis: Secondary | ICD-10-CM | POA: Diagnosis not present

## 2014-11-16 DIAGNOSIS — I5033 Acute on chronic diastolic (congestive) heart failure: Secondary | ICD-10-CM | POA: Diagnosis not present

## 2014-11-16 DIAGNOSIS — I10 Essential (primary) hypertension: Secondary | ICD-10-CM | POA: Diagnosis present

## 2014-11-16 DIAGNOSIS — J440 Chronic obstructive pulmonary disease with acute lower respiratory infection: Secondary | ICD-10-CM | POA: Diagnosis not present

## 2014-11-16 DIAGNOSIS — N1832 Chronic kidney disease, stage 3b: Secondary | ICD-10-CM | POA: Diagnosis present

## 2014-11-16 DIAGNOSIS — R600 Localized edema: Secondary | ICD-10-CM | POA: Diagnosis present

## 2014-11-16 DIAGNOSIS — Z87891 Personal history of nicotine dependence: Secondary | ICD-10-CM

## 2014-11-16 DIAGNOSIS — J441 Chronic obstructive pulmonary disease with (acute) exacerbation: Secondary | ICD-10-CM | POA: Diagnosis present

## 2014-11-16 DIAGNOSIS — I872 Venous insufficiency (chronic) (peripheral): Secondary | ICD-10-CM | POA: Diagnosis present

## 2014-11-16 DIAGNOSIS — K219 Gastro-esophageal reflux disease without esophagitis: Secondary | ICD-10-CM | POA: Diagnosis not present

## 2014-11-16 DIAGNOSIS — L97909 Non-pressure chronic ulcer of unspecified part of unspecified lower leg with unspecified severity: Secondary | ICD-10-CM

## 2014-11-16 DIAGNOSIS — L03119 Cellulitis of unspecified part of limb: Secondary | ICD-10-CM | POA: Diagnosis not present

## 2014-11-16 DIAGNOSIS — Z6841 Body Mass Index (BMI) 40.0 and over, adult: Secondary | ICD-10-CM | POA: Diagnosis not present

## 2014-11-16 DIAGNOSIS — R0603 Acute respiratory distress: Secondary | ICD-10-CM | POA: Insufficient documentation

## 2014-11-16 DIAGNOSIS — I878 Other specified disorders of veins: Secondary | ICD-10-CM | POA: Diagnosis present

## 2014-11-16 DIAGNOSIS — D509 Iron deficiency anemia, unspecified: Secondary | ICD-10-CM | POA: Diagnosis present

## 2014-11-16 DIAGNOSIS — I2781 Cor pulmonale (chronic): Secondary | ICD-10-CM | POA: Diagnosis present

## 2014-11-16 DIAGNOSIS — N179 Acute kidney failure, unspecified: Secondary | ICD-10-CM | POA: Diagnosis present

## 2014-11-16 DIAGNOSIS — R7303 Prediabetes: Secondary | ICD-10-CM | POA: Diagnosis present

## 2014-11-16 DIAGNOSIS — R05 Cough: Secondary | ICD-10-CM | POA: Diagnosis not present

## 2014-11-16 HISTORY — DX: Cor pulmonale (chronic): I27.81

## 2014-11-16 LAB — I-STAT CG4 LACTIC ACID, ED: Lactic Acid, Venous: 2.65 mmol/L (ref 0.5–2.0)

## 2014-11-16 LAB — BLOOD GAS, ARTERIAL
ACID-BASE EXCESS: 1.6 mmol/L (ref 0.0–2.0)
BICARBONATE: 26.6 meq/L — AB (ref 20.0–24.0)
DRAWN BY: 29501
O2 Content: 3 L/min
O2 SAT: 94.3 %
PCO2 ART: 46.4 mmHg — AB (ref 35.0–45.0)
PO2 ART: 78.9 mmHg — AB (ref 80.0–100.0)
Patient temperature: 98.6
TCO2: 25.1 mmol/L (ref 0–100)
pH, Arterial: 7.376 (ref 7.350–7.450)

## 2014-11-16 LAB — CBC
HCT: 30.6 % — ABNORMAL LOW (ref 36.0–46.0)
Hemoglobin: 9.1 g/dL — ABNORMAL LOW (ref 12.0–15.0)
MCH: 23.2 pg — ABNORMAL LOW (ref 26.0–34.0)
MCHC: 29.7 g/dL — ABNORMAL LOW (ref 30.0–36.0)
MCV: 77.9 fL — ABNORMAL LOW (ref 78.0–100.0)
Platelets: 248 10*3/uL (ref 150–400)
RBC: 3.93 MIL/uL (ref 3.87–5.11)
RDW: 17 % — AB (ref 11.5–15.5)
WBC: 11.3 10*3/uL — ABNORMAL HIGH (ref 4.0–10.5)

## 2014-11-16 LAB — BASIC METABOLIC PANEL
Anion gap: 9 (ref 5–15)
BUN: 31 mg/dL — ABNORMAL HIGH (ref 6–23)
CO2: 26 mmol/L (ref 19–32)
CREATININE: 1.6 mg/dL — AB (ref 0.50–1.10)
Calcium: 8.9 mg/dL (ref 8.4–10.5)
Chloride: 101 mmol/L (ref 96–112)
GFR calc Af Amer: 36 mL/min — ABNORMAL LOW (ref >90)
GFR calc non Af Amer: 31 mL/min — ABNORMAL LOW (ref >90)
Glucose, Bld: 126 mg/dL — ABNORMAL HIGH (ref 70–99)
POTASSIUM: 4 mmol/L (ref 3.5–5.1)
Sodium: 136 mmol/L (ref 135–145)

## 2014-11-16 LAB — BRAIN NATRIURETIC PEPTIDE: B Natriuretic Peptide: 42.3 pg/mL (ref 0.0–100.0)

## 2014-11-16 LAB — I-STAT TROPONIN, ED: Troponin i, poc: 0.01 ng/mL (ref 0.00–0.08)

## 2014-11-16 LAB — LACTIC ACID, PLASMA
LACTIC ACID, VENOUS: 5 mmol/L — AB (ref 0.5–2.0)
Lactic Acid, Venous: 4.4 mmol/L (ref 0.5–2.0)

## 2014-11-16 LAB — GLUCOSE, CAPILLARY
Glucose-Capillary: 246 mg/dL — ABNORMAL HIGH (ref 70–99)
Glucose-Capillary: 218 mg/dL — ABNORMAL HIGH (ref 70–99)

## 2014-11-16 MED ORDER — METHYLPREDNISOLONE SODIUM SUCC 125 MG IJ SOLR
125.0000 mg | Freq: Once | INTRAMUSCULAR | Status: AC
  Administered 2014-11-16: 125 mg via INTRAVENOUS
  Filled 2014-11-16: qty 2

## 2014-11-16 MED ORDER — BENZONATATE 100 MG PO CAPS
200.0000 mg | ORAL_CAPSULE | Freq: Once | ORAL | Status: AC
  Administered 2014-11-16: 200 mg via ORAL
  Filled 2014-11-16: qty 2

## 2014-11-16 MED ORDER — METOPROLOL TARTRATE 50 MG PO TABS
50.0000 mg | ORAL_TABLET | Freq: Two times a day (BID) | ORAL | Status: DC
  Administered 2014-11-16 – 2014-11-23 (×15): 50 mg via ORAL
  Filled 2014-11-16 (×5): qty 1
  Filled 2014-11-16: qty 2
  Filled 2014-11-16 (×5): qty 1
  Filled 2014-11-16: qty 2
  Filled 2014-11-16 (×4): qty 1

## 2014-11-16 MED ORDER — GUAIFENESIN 100 MG/5ML PO SYRP
200.0000 mg | ORAL_SOLUTION | ORAL | Status: DC | PRN
  Administered 2014-11-16: 200 mg via ORAL
  Filled 2014-11-16: qty 10

## 2014-11-16 MED ORDER — IPRATROPIUM-ALBUTEROL 0.5-2.5 (3) MG/3ML IN SOLN
3.0000 mL | RESPIRATORY_TRACT | Status: DC
  Administered 2014-11-16 – 2014-11-17 (×7): 3 mL via RESPIRATORY_TRACT
  Filled 2014-11-16 (×7): qty 3

## 2014-11-16 MED ORDER — ALBUTEROL (5 MG/ML) CONTINUOUS INHALATION SOLN
10.0000 mg/h | INHALATION_SOLUTION | Freq: Once | RESPIRATORY_TRACT | Status: AC
  Administered 2014-11-16: 10 mg/h via RESPIRATORY_TRACT
  Filled 2014-11-16: qty 20

## 2014-11-16 MED ORDER — INSULIN ASPART 100 UNIT/ML ~~LOC~~ SOLN
0.0000 [IU] | Freq: Every day | SUBCUTANEOUS | Status: DC
  Administered 2014-11-16 – 2014-11-18 (×3): 2 [IU] via SUBCUTANEOUS

## 2014-11-16 MED ORDER — INSULIN ASPART 100 UNIT/ML ~~LOC~~ SOLN
0.0000 [IU] | Freq: Three times a day (TID) | SUBCUTANEOUS | Status: DC
  Administered 2014-11-16 – 2014-11-17 (×2): 3 [IU] via SUBCUTANEOUS
  Administered 2014-11-17 (×2): 2 [IU] via SUBCUTANEOUS
  Administered 2014-11-18: 1 [IU] via SUBCUTANEOUS
  Administered 2014-11-18: 2 [IU] via SUBCUTANEOUS
  Administered 2014-11-18: 7 [IU] via SUBCUTANEOUS
  Administered 2014-11-19: 1 [IU] via SUBCUTANEOUS
  Administered 2014-11-19: 3 [IU] via SUBCUTANEOUS
  Administered 2014-11-19: 5 [IU] via SUBCUTANEOUS

## 2014-11-16 MED ORDER — SODIUM CHLORIDE 0.9 % IJ SOLN
3.0000 mL | Freq: Two times a day (BID) | INTRAMUSCULAR | Status: DC
  Administered 2014-11-16 – 2014-11-23 (×12): 3 mL via INTRAVENOUS

## 2014-11-16 MED ORDER — OSELTAMIVIR PHOSPHATE 75 MG PO CAPS
75.0000 mg | ORAL_CAPSULE | Freq: Every day | ORAL | Status: AC
  Administered 2014-11-16: 75 mg via ORAL
  Filled 2014-11-16: qty 1

## 2014-11-16 MED ORDER — ALBUTEROL SULFATE (2.5 MG/3ML) 0.083% IN NEBU
2.5000 mg | INHALATION_SOLUTION | RESPIRATORY_TRACT | Status: DC | PRN
  Administered 2014-11-16: 2.5 mg via RESPIRATORY_TRACT
  Filled 2014-11-16 (×3): qty 3

## 2014-11-16 MED ORDER — SODIUM CHLORIDE 0.9 % IJ SOLN: 3.0000 mL | Freq: Two times a day (BID) | INTRAMUSCULAR | Status: DC

## 2014-11-16 MED ORDER — SODIUM CHLORIDE 0.9 % IJ SOLN: 3.0000 mL | INTRAMUSCULAR | Status: DC | PRN

## 2014-11-16 MED ORDER — FUROSEMIDE 10 MG/ML IJ SOLN
80.0000 mg | Freq: Once | INTRAMUSCULAR | Status: AC
  Administered 2014-11-16: 80 mg via INTRAVENOUS
  Filled 2014-11-16: qty 8

## 2014-11-16 MED ORDER — HEPARIN SODIUM (PORCINE) 5000 UNIT/ML IJ SOLN
5000.0000 [IU] | Freq: Three times a day (TID) | INTRAMUSCULAR | Status: DC
  Administered 2014-11-16 – 2014-11-23 (×21): 5000 [IU] via SUBCUTANEOUS
  Filled 2014-11-16 (×25): qty 1

## 2014-11-16 MED ORDER — ALUM & MAG HYDROXIDE-SIMETH 200-200-20 MG/5ML PO SUSP: 30.0000 mL | Freq: Four times a day (QID) | ORAL | Status: DC | PRN

## 2014-11-16 MED ORDER — CETYLPYRIDINIUM CHLORIDE 0.05 % MT LIQD
7.0000 mL | Freq: Two times a day (BID) | OROMUCOSAL | Status: DC
  Administered 2014-11-17 – 2014-11-23 (×12): 7 mL via OROMUCOSAL

## 2014-11-16 MED ORDER — ONDANSETRON HCL 4 MG/2ML IJ SOLN: 4.0000 mg | Freq: Four times a day (QID) | INTRAMUSCULAR | Status: DC | PRN

## 2014-11-16 MED ORDER — ACETAMINOPHEN 325 MG PO TABS: 650.0000 mg | ORAL_TABLET | Freq: Four times a day (QID) | ORAL | Status: DC | PRN

## 2014-11-16 MED ORDER — LEVOFLOXACIN IN D5W 500 MG/100ML IV SOLN
500.0000 mg | INTRAVENOUS | Status: DC
  Administered 2014-11-16 – 2014-11-17 (×2): 500 mg via INTRAVENOUS
  Filled 2014-11-16 (×2): qty 100

## 2014-11-16 MED ORDER — OSELTAMIVIR PHOSPHATE 30 MG PO CAPS
30.0000 mg | ORAL_CAPSULE | Freq: Two times a day (BID) | ORAL | Status: DC
  Administered 2014-11-16 – 2014-11-17 (×2): 30 mg via ORAL
  Filled 2014-11-16 (×2): qty 1

## 2014-11-16 MED ORDER — METHYLPREDNISOLONE SODIUM SUCC 125 MG IJ SOLR
60.0000 mg | Freq: Four times a day (QID) | INTRAMUSCULAR | Status: DC
  Administered 2014-11-16 – 2014-11-17 (×5): 60 mg via INTRAVENOUS
  Filled 2014-11-16 (×5): qty 0.96

## 2014-11-16 MED ORDER — ALLOPURINOL 100 MG PO TABS
100.0000 mg | ORAL_TABLET | Freq: Every day | ORAL | Status: DC
  Administered 2014-11-16 – 2014-11-23 (×8): 100 mg via ORAL
  Filled 2014-11-16 (×8): qty 1

## 2014-11-16 MED ORDER — LOSARTAN POTASSIUM 50 MG PO TABS
100.0000 mg | ORAL_TABLET | Freq: Every day | ORAL | Status: DC
  Administered 2014-11-17 – 2014-11-23 (×7): 100 mg via ORAL
  Filled 2014-11-16 (×7): qty 2

## 2014-11-16 MED ORDER — FUROSEMIDE 10 MG/ML IJ SOLN
40.0000 mg | Freq: Two times a day (BID) | INTRAMUSCULAR | Status: DC
  Administered 2014-11-16 – 2014-11-17 (×2): 40 mg via INTRAVENOUS
  Filled 2014-11-16 (×2): qty 4

## 2014-11-16 MED ORDER — ONDANSETRON HCL 4 MG PO TABS
4.0000 mg | ORAL_TABLET | Freq: Four times a day (QID) | ORAL | Status: DC | PRN
Start: 2014-11-16 — End: 2014-11-23

## 2014-11-16 MED ORDER — FLUTICASONE FUROATE-VILANTEROL 100-25 MCG/INH IN AEPB
1.0000 | INHALATION_SPRAY | Freq: Every day | RESPIRATORY_TRACT | Status: DC
  Administered 2014-11-21: 1 via RESPIRATORY_TRACT

## 2014-11-16 MED ORDER — SODIUM CHLORIDE 0.9 % IV SOLN: 250.0000 mL | INTRAVENOUS | Status: DC | PRN

## 2014-11-16 MED ORDER — BENZONATATE 100 MG PO CAPS
200.0000 mg | ORAL_CAPSULE | Freq: Three times a day (TID) | ORAL | Status: DC
  Administered 2014-11-16 – 2014-11-17 (×3): 200 mg via ORAL
  Filled 2014-11-16 (×5): qty 2

## 2014-11-16 MED ORDER — ASPIRIN EC 81 MG PO TBEC
81.0000 mg | DELAYED_RELEASE_TABLET | Freq: Every day | ORAL | Status: DC
Start: 2014-11-17 — End: 2014-11-23
  Administered 2014-11-17 – 2014-11-23 (×7): 81 mg via ORAL
  Filled 2014-11-16 (×7): qty 1

## 2014-11-16 MED ORDER — ACETAMINOPHEN 650 MG RE SUPP: 650.0000 mg | Freq: Four times a day (QID) | RECTAL | Status: DC | PRN

## 2014-11-16 NOTE — Progress Notes (Signed)
Pre visit review using our clinic review tool, if applicable. No additional management support is needed unless otherwise documented below in the visit note. 

## 2014-11-16 NOTE — Progress Notes (Signed)
   Subjective:    Patient ID: [name], female    DOB: September 08, 1940, 74 y.o.   MRN: [id number]  Cough This is a recurrent problem. The current episode started 1 to 4 weeks ago. The problem has been gradually worsening. The problem occurs every few hours. The cough is productive of purulent sputum. Associated symptoms include chills, ear pain, a fever, a sore throat, shortness of breath and wheezing. Pertinent negatives include no chest pain, ear congestion, headaches, heartburn, hemoptysis, myalgias, nasal congestion, postnasal drip, rash, rhinorrhea, sweats or weight loss. Her past medical history is significant for asthma, bronchitis and COPD. There is no history of bronchiectasis, emphysema, environmental allergies or pneumonia.      Review of Systems  Constitutional: Positive for fever and chills. Negative for weight loss.  HENT: Positive for ear pain and sore throat. Negative for postnasal drip and rhinorrhea.   Respiratory: Positive for cough, shortness of breath and wheezing. Negative for hemoptysis.   Cardiovascular: Negative for chest pain.  Gastrointestinal: Negative for heartburn.  Musculoskeletal: Negative for myalgias.  Skin: Negative for rash.  Allergic/Immunologic: Negative for environmental allergies.  Neurological: Negative for headaches.       Objective:   Physical Exam  Constitutional: She is oriented to person, place, and time. She appears well-developed and well-nourished.  Non-toxic appearance. She does not have a sickly appearance. She does not appear ill. She appears distressed.  HENT:  Head: Normocephalic and atraumatic.  Mouth/Throat: Oropharynx is clear and moist. No oropharyngeal exudate.  Eyes: Conjunctivae are normal. Right eye exhibits no discharge. Left eye exhibits no discharge. No scleral icterus.  Neck: Normal range of motion. Neck supple. No JVD present. No tracheal deviation present. No thyromegaly present.  Cardiovascular: Regular rhythm,  normal heart sounds and intact distal pulses.  Tachycardia present.  Exam reveals no gallop and no friction rub.   No murmur heard. Pulmonary/Chest: Accessory muscle usage present. No stridor. Tachypnea noted. She is in respiratory distress. She has no decreased breath sounds. She has wheezes in the right middle field, the right lower field, the left middle field and the left lower field. She has rhonchi in the right middle field and the left middle field. She has no rales.  Abdominal: Soft. Bowel sounds are normal. She exhibits no distension and no mass. There is no tenderness. There is no rebound and no guarding.  Musculoskeletal: Normal range of motion. She exhibits no edema or tenderness.  Lymphadenopathy:    She has no cervical adenopathy.  Neurological: She is oriented to person, place, and time.  Skin: Skin is warm and dry. No rash noted. She is not diaphoretic. No erythema. No pallor.          Assessment & Plan:

## 2014-11-16 NOTE — ED Provider Notes (Signed)
CSN: [id number]     Arrival date & time 11/16/14  1003 History   First MD Initiated Contact with Patient 11/16/14 1021     Chief Complaint  Patient presents with  . Shortness of Breath  . Cough     HPI  Patient presents for evaluation of altered breathing. Has a home nebulizer. Does not have home O2. Has had O2 at home in the past. 4-5 days of progressive cough productive of yellow to clear sputum. No hemoptysis. Diffuse bodyaches. No localizing chest pain. States she checked her home saturation yesterday morning when she awakened short of breath and it was "70". Seen her primary care today and saturations mid 80s and sent here.  History of CHF. Compliant with her Lasix. No history of ischemic heart disease or heart surgeries.  Past Medical History  Diagnosis Date  . HTN (hypertension) 07/30/1988  . GERD (gastroesophageal reflux disease) 07/30/92  . COPD (chronic obstructive pulmonary disease) 01/27/98  . Hyperlipidemia 11/27/96  . CHF (congestive heart failure) 08/31/03  . Gout   . Renal insufficiency   . Kidney stone 1960, 1972, 1991  . Ulcerative colitis    Past Surgical History  Procedure Laterality Date  . No past surgeries     Family History  Problem Relation Age of Onset  . Stroke Mother   . Diabetes Mother   . Kidney cancer Mother   . Stomach cancer Mother   . Heart disease Mother   . Stroke Father   . Heart disease Sister     CATH,STENT  . Clotting disorder Sister   . Cervical cancer Sister   . Heart disease Brother   . Colon cancer Neg Hx   . Heart disease Brother   . Crohn's disease Brother   . Lung cancer Sister   . Heart attack Sister   . Diabetes Sister    History  Substance Use Topics  . Smoking status: Former Smoker    Quit date: 06/01/1996  . Smokeless tobacco: Never Used     Comment: Quit 1998  . Alcohol Use: No   OB History    No data available     Review of Systems  Constitutional: Negative for fever, chills, diaphoresis, appetite change and  fatigue.  HENT: Negative for mouth sores, sore throat and trouble swallowing.   Eyes: Negative for visual disturbance.  Respiratory: Positive for cough, shortness of breath and wheezing. Negative for chest tightness.   Cardiovascular: Positive for leg swelling. Negative for chest pain.  Gastrointestinal: Negative for nausea, vomiting, abdominal pain, diarrhea and abdominal distention.  Endocrine: Negative for polydipsia, polyphagia and polyuria.  Genitourinary: Negative for dysuria, frequency and hematuria.  Musculoskeletal: Negative for gait problem.  Skin: Negative for color change, pallor and rash.  Neurological: Negative for dizziness, syncope, light-headedness and headaches.  Hematological: Does not bruise/bleed easily.  Psychiatric/Behavioral: Negative for behavioral problems and confusion.      Allergies  Celebrex; Enalapril; Lipitor; Amlodipine besylate; Amoxicillin; Codeine sulfate; and Hydrocodone-acetaminophen  Home Medications   Prior to Admission medications   Medication Sig Start Date End Date Taking? Authorizing Provider  albuterol (PROAIR HFA) 108 (90 BASE) MCG/ACT inhaler Inhale 1-2 puffs into the lungs every 6 (six) hours as needed for wheezing or shortness of breath. 11/25/13  Yes [name], MD  allopurinol (ZYLOPRIM) 100 MG tablet TAKE 1 TABLET BY MOUTH DAILY FOR GOUT Patient taking differently: 1 tablet by mouth daily for gout. 07/29/14  Yes [name], MD  aspirin 81  MG EC tablet Take 81 mg by mouth daily.     Yes Historical Provider, MD  Dietary Management Product (VASCULERA) TABS TAKE 1 TABLET BY MOUTH EVERY DAY. Patient taking differently: Take 1 tablet by mouth every day. 04/14/14  Yes [name], MD  Fluticasone Furoate-Vilanterol (BREO ELLIPTA) 100-25 MCG/INH AEPB Inhale 1 puff into the lungs daily. 04/14/14  Yes [name], MD  furosemide (LASIX) 40 MG tablet TAKE 1 TABLET BY MOUTH TWICE DAILY Patient taking differently: Take 1 tablet by  mouth twice daily. 04/01/14  Yes [name], MD  losartan (COZAAR) 100 MG tablet TAKE 1 TABLET BY MOUTH EVERY DAY Patient taking differently: Take 1 tablet by mouth every day. 04/01/14  Yes [name], MD  metoprolol (LOPRESSOR) 50 MG tablet Take 1 tablet (50 mg total) by mouth 2 (two) times daily. 11/25/13  Yes [name], MD  naproxen sodium (ANAPROX) 220 MG tablet Take 220 mg by mouth 2 (two) times daily as needed (pain/inflammation.).   Yes Historical Provider, MD  Vitamin D, Ergocalciferol, (DRISDOL) 50000 UNITS CAPS capsule TAKE 1 CAPSULE BY MOUTH ONCE A WEEK Patient taking differently: Take 1 capsule by mouth once a week. 04/14/14  Yes [name], MD  ketoconazole (NIZORAL) 2 % cream Apply 1 application topically 2 (two) times daily. Patient taking differently: Apply 1 application topically 2 (two) times daily as needed for irritation.  04/22/14   [name], MD   BP 128/43 mmHg  Pulse 83  Temp(Src) 98.1 F (36.7 C) (Oral)  Resp 20  SpO2 100% Physical Exam  Constitutional: She is oriented to person, place, and time. She appears well-developed and well-nourished. No distress.  HENT:  Head: Normocephalic.  Eyes: Conjunctivae are normal. Pupils are equal, round, and reactive to light. No scleral icterus.  Neck: Normal range of motion. Neck supple. No thyromegaly present.  Cardiovascular: Normal rate and regular rhythm.  Exam reveals no gallop and no friction rub.   No murmur heard. Pulmonary/Chest: Effort normal and breath sounds normal. No respiratory distress. She has no wheezes. She has no rales.  Wheezing. Distant breath sounds. Bilateral symmetric lower extremity edema.  Abdominal: Soft. Bowel sounds are normal. She exhibits no distension. There is no tenderness. There is no rebound.  Musculoskeletal: Normal range of motion.  Neurological: She is alert and oriented to person, place, and time.  Skin: Skin is warm and dry. No rash noted.  Psychiatric: She has a  normal mood and affect. Her behavior is normal.    ED Course  Procedures (including critical care time) Labs Review Labs Reviewed  BASIC METABOLIC PANEL - Abnormal; Notable for the following:    Glucose, Bld 126 (*)    BUN 31 (*)    Creatinine, Ser 1.60 (*)    GFR calc non Af Amer 31 (*)    GFR calc Af Amer 36 (*)    All other components within normal limits  CBC - Abnormal; Notable for the following:    WBC 11.3 (*)    Hemoglobin 9.1 (*)    HCT 30.6 (*)    MCV 77.9 (*)    MCH 23.2 (*)    MCHC 29.7 (*)    RDW 17.0 (*)    All other components within normal limits  BLOOD GAS, ARTERIAL - Abnormal; Notable for the following:    pCO2 arterial 46.4 (*)    pO2, Arterial 78.9 (*)    Bicarbonate 26.6 (*)    All  other components within normal limits  I-STAT CG4 LACTIC ACID, ED - Abnormal; Notable for the following:    Lactic Acid, Venous 2.65 (*)    All other components within normal limits  RESPIRATORY VIRUS PANEL  BRAIN NATRIURETIC PEPTIDE  HEMOGLOBIN A1C  INFLUENZA PANEL BY PCR (TYPE A & B, H1N1)  I-STAT TROPOININ, ED    Imaging Review Dg Chest 2 View (if Patient Has Fever And/or Copd)  11/16/2014   CLINICAL DATA:  Cough and wheezing since Saturday, shortness of breath, history COPD, hypertension, former smoker  EXAM: CHEST  2 VIEW  COMPARISON:  04/22/2014  FINDINGS: Minimal enlargement of cardiac silhouette with pulmonary vascular congestion.  Atherosclerotic calcification aorta.  Mediastinal contours normal.  Chronic mild accentuation of perihilar markings and minimal chronic peribronchial thickening.  Bibasilar atelectasis.  No definite acute infiltrate, pleural effusion or pneumothorax.  Bones demineralized.  IMPRESSION: Enlargement of cardiac silhouette with pulmonary vascular congestion.  Chronic bronchitic changes with bibasilar atelectasis.   Electronically Signed   By: Lavonia Dana M.D.   On: 11/16/2014 11:08     EKG Interpretation None      MDM   Final diagnoses:   Chronic obstructive pulmonary disease, unspecified COPD, unspecified chronic bronchitis type  Cor pulmonale        Tanna Furry, MD 11/16/14 1426

## 2014-11-16 NOTE — Progress Notes (Signed)
CSW received consult for COPD Gold Protocol, though patient does not meet qualifications (only 1 admission within the past 6 months).   CSW signing off.   Yarimar Lavis, LCSW [facility] Clinical Social Worker cell #: [phone]    

## 2014-11-16 NOTE — ED Notes (Signed)
Pt reports hx of cigarette smoking x50 years, hx COPD, SOB and cough since Saturday 4/16, productive cough yellow/clear sputum. Body aches 5/10. Went to pcp today, was told her oxygen level was low and pt needed to come to ED. O2 sat 89-91% on room air. Crackles/ possible wheezing heard lung sounds.

## 2014-11-16 NOTE — H&P (Signed)
[facility] History and Physical                                                                                    [name], is a 74 y.o. female  MRN: [id number]   DOB - [birth year]  Admit Date - 11/16/2014  Outpatient Primary MD for the patient is [name], MD  With History of -  Past Medical History  Diagnosis Date  . HTN (hypertension) 07/30/1988  . GERD (gastroesophageal reflux disease) 07/30/92  . COPD (chronic obstructive pulmonary disease) 01/27/98  . Hyperlipidemia 11/27/96  . CHF (congestive heart failure) 08/31/03  . Gout   . Renal insufficiency   . Kidney stone 1960, 1972, 1991  . Ulcerative colitis       Past Surgical History  Procedure Laterality Date  . No past surgeries      in for   Chief Complaint  Patient presents with  . Shortness of Breath  . Cough     HPI 74 year old female patient with known COPD former tobacco abuse, sleep apnea, known diastolic dysfunction, MGUS, hypertension, kidney disease stage III, prediabetes and gout. Patient initially presented to her primary care physician for her scheduled appointment regarding respiratory issues. The patient endorsed that for at least 2 weeks she had been experiencing a productive cough of yellow sputum. She attempted over-the-counter treatments without improvement in her symptoms. She began to worsen on Saturday to the point she had to leave work because of excessive coughing and shortness of breath. She also awakened at night and continued to have increasing wheeziness and shortness of breath so she opted to notify her physician and an appointment was made for 4/19. Prior to that appointment patient had noticed increasing shortness of breath and wheezing so she took an extra dose of Lasix was increasing urine output. She had noted some orthopnea. Other symptoms she had been experiencing since onset of the symptoms include left earache and subjective fevers. Patient reports she did not take a flu  immunization this year. She is also had anorexia symptoms with variable urine output. She also has some musculoskeletal chest discomfort with coughing and deep breathing. She has not had any GI symptoms. Upon evaluation at primary care physician's office patient was noted with diffuse wheezing and increased work of breathing with hypoxia and was sent to the emergency department for further evaluation.  Upon presentation to the ER patient was hypoxemic and required placement of oxygen, she was tachycardic and hypertensive. Her chest x-ray demonstrated both COPD changes as well as increased pulmonary vascular congestion so she was given Lasix as well as 1 dose of Solu-Medrol as well as was given two continuous nebulizers with some improvement in symptoms. She's no longer using accessory muscles to breathe. She still is requiring upright position to breathe comfortably. Other symptomatology: Patient has chronic bilateral lower extremity edema which may have worsened over the past several weeks. No other symptoms reported. Lactic acid 2.65, ABG with mild elevation PCO2 with normal pH and mild low PO2 of 79.  Review of Systems   In addition to the HPI above,  No Fever-chills, myalgias or other constitutional symptoms No Headache,  changes with Vision or hearing, new weakness, tingling, numbness in any extremity, No problems swallowing food or Liquids, indigestion/reflux No Chest pain, Cough or Shortness of Breath, palpitations, orthopnea or DOE No Abdominal pain, N/V; no melena or hematochezia, no dark tarry stools, Bowel movements are regular, No dysuria, hematuria or flank pain No new skin rashes, lesions, masses or bruises, No new joints pains-aches No recent weight gain or loss No polyuria, polydypsia or polyphagia,  *A full 10 point Review of Systems was done, except as stated above, all other Review of Systems were negative.  Social History History  Substance Use Topics  . Smoking status:  Former Smoker    Quit date: 06/01/1996  . Smokeless tobacco: Never Used     Comment: Quit 1998  . Alcohol Use: No    Family History Family History  Problem Relation Age of Onset  . Stroke Mother   . Diabetes Mother   . Kidney cancer Mother   . Stomach cancer Mother   . Heart disease Mother   . Stroke Father   . Heart disease Sister     CATH,STENT  . Clotting disorder Sister   . Cervical cancer Sister   . Heart disease Brother   . Colon cancer Neg Hx   . Heart disease Brother   . Crohn's disease Brother   . Lung cancer Sister   . Heart attack Sister   . Diabetes Sister     Prior to Admission medications   Medication Sig Start Date End Date Taking? Authorizing Provider  albuterol (PROAIR HFA) 108 (90 BASE) MCG/ACT inhaler Inhale 1-2 puffs into the lungs every 6 (six) hours as needed for wheezing or shortness of breath. 11/25/13  Yes [name], MD  allopurinol (ZYLOPRIM) 100 MG tablet TAKE 1 TABLET BY MOUTH DAILY FOR GOUT Patient taking differently: 1 tablet by mouth daily for gout. 07/29/14  Yes [name], MD  aspirin 81 MG EC tablet Take 81 mg by mouth daily.     Yes Historical Provider, MD  Dietary Management Product (VASCULERA) TABS TAKE 1 TABLET BY MOUTH EVERY DAY. Patient taking differently: Take 1 tablet by mouth every day. 04/14/14  Yes [name], MD  Fluticasone Furoate-Vilanterol (BREO ELLIPTA) 100-25 MCG/INH AEPB Inhale 1 puff into the lungs daily. 04/14/14  Yes [name], MD  furosemide (LASIX) 40 MG tablet TAKE 1 TABLET BY MOUTH TWICE DAILY Patient taking differently: Take 1 tablet by mouth twice daily. 04/01/14  Yes [name], MD  losartan (COZAAR) 100 MG tablet TAKE 1 TABLET BY MOUTH EVERY DAY Patient taking differently: Take 1 tablet by mouth every day. 04/01/14  Yes [name], MD  metoprolol (LOPRESSOR) 50 MG tablet Take 1 tablet (50 mg total) by mouth 2 (two) times daily. 11/25/13  Yes [name], MD  naproxen sodium (ANAPROX) 220 MG  tablet Take 220 mg by mouth 2 (two) times daily as needed (pain/inflammation.).   Yes Historical Provider, MD  Vitamin D, Ergocalciferol, (DRISDOL) 50000 UNITS CAPS capsule TAKE 1 CAPSULE BY MOUTH ONCE A WEEK Patient taking differently: Take 1 capsule by mouth once a week. 04/14/14  Yes [name], MD  ketoconazole (NIZORAL) 2 % cream Apply 1 application topically 2 (two) times daily. Patient taking differently: Apply 1 application topically 2 (two) times daily as needed for irritation.  04/22/14   [name], MD    Allergies  Allergen Reactions  . Celebrex [Celecoxib]     edema  .  Enalapril Cough  . Lipitor [Atorvastatin]     Muscle aches  . Amlodipine Besylate     REACTION: edema  . Amoxicillin     REACTION: Diarrhea  . Codeine Sulfate     REACTION: Nausea  . Hydrocodone-Acetaminophen     REACTION: Nausea    Physical Exam  Vitals  Blood pressure 128/43, pulse 83, temperature 98.1 F (36.7 C), temperature source Oral, resp. rate 20, SpO2 100 %.   General:  In no acute distress, appears stated age-second continuous nebulizer and process  Psych:  Normal affect, Denies Suicidal or Homicidal ideations, Awake Alert, Oriented X 3. Speech and thought patterns are clear and appropriate, no apparent short term memory deficits  Neuro:   No focal neurological deficits, CN II through XII intact, Strength 5/5 all 4 extremities, Sensation intact all 4 extremities.  ENT:  Ears and Eyes appear Normal, Conjunctivae clear, PER. Moist oral mucosa without erythema or exudates.  Neck:  Supple, No lymphadenopathy appreciated  Respiratory:  Symmetrical chest wall movement, adequate air movement bilaterally, diffuse course expiratory wheezes, 3 L oxygen with nebulizer not in process  Cardiac:  RRR, No Murmurs but difficult to auscultate adequately given degree of anterior wheezing auscultated, 2+ bilateral LE edema noted thighs, no JVD, No carotid bruits, peripheral pulses palpable at  2+  Abdomen:  Positive bowel sounds, Soft, Non tender, Non distended,  No masses appreciated, no obvious hepatosplenomegaly  Skin:  No Cyanosis, Normal Skin Turgor, No Skin Rash or Bruise. Brawny skin changes bilateral lower extremities below the knee with hemosiderin changes in a stocking-like pattern consistent with chronic stasis dermatitis  Extremities: Symmetrical without obvious trauma or injury,  no effusions.  Data Review  CBC  Recent Labs Lab 11/16/14 1051  WBC 11.3*  HGB 9.1*  HCT 30.6*  PLT 248  MCV 77.9*  MCH 23.2*  MCHC 29.7*  RDW 17.0*    Chemistries   Recent Labs Lab 11/16/14 1051  NA 136  K 4.0  CL 101  CO2 26  GLUCOSE 126*  BUN 31*  CREATININE 1.60*  CALCIUM 8.9    estimated creatinine clearance is 34 mL/min (by C-G formula based on Cr of 1.6).  No results for input(s): TSH, T4TOTAL, T3FREE, THYROIDAB in the last 72 hours.  Invalid input(s): FREET3  Coagulation profile No results for input(s): INR, PROTIME in the last 168 hours.  No results for input(s): DDIMER in the last 72 hours.  Cardiac Enzymes No results for input(s): CKMB, TROPONINI, MYOGLOBIN in the last 168 hours.  Invalid input(s): CK  Invalid input(s): POCBNP  Urinalysis    Component Value Date/Time   COLORURINE YELLOW 04/22/2014 [place] 04/22/2014 1420   LABSPEC <=1.005* 04/22/2014 1420   PHURINE 6.0 04/22/2014 1420   GLUCOSEU NEGATIVE 04/22/2014 1420   GLUCOSEU NEGATIVE 06/01/2013 1722   HGBUR NEGATIVE 04/22/2014 1420   HGBUR moderate 02/02/2008 1439   BILIRUBINUR NEGATIVE 04/22/2014 1420   KETONESUR NEGATIVE 04/22/2014 1420   PROTEINUR 100* 06/01/2013 1722   UROBILINOGEN 0.2 04/22/2014 1420   NITRITE NEGATIVE 04/22/2014 1420   LEUKOCYTESUR NEGATIVE 04/22/2014 1420    Imaging results:   Dg Chest 2 View (if Patient Has Fever And/or Copd)  11/16/2014   CLINICAL DATA:  Cough and wheezing since Saturday, shortness of breath, history COPD,  hypertension, former smoker  EXAM: CHEST  2 VIEW  COMPARISON:  04/22/2014  FINDINGS: Minimal enlargement of cardiac silhouette with pulmonary vascular congestion.  Atherosclerotic calcification aorta.  Mediastinal contours  normal.  Chronic mild accentuation of perihilar markings and minimal chronic peribronchial thickening.  Bibasilar atelectasis.  No definite acute infiltrate, pleural effusion or pneumothorax.  Bones demineralized.  IMPRESSION: Enlargement of cardiac silhouette with pulmonary vascular congestion.  Chronic bronchitic changes with bibasilar atelectasis.   Electronically Signed   By: Lavonia Dana M.D.   On: 11/16/2014 11:08     EKG: Sinus rhythm with left atrial enlargement and normal QTC   Assessment & Plan  Principal Problem:   Acute respiratory failure with hypoxia: A) COPD exacerbation B) Acute on chronic diastolic CHF C) Possible influenza -Admit to telemetry -Levaquin to cover for tracheobronchitis -Tamiflu to cover for influenza; check influenza PCR and initiate droplet precautions -Continue IV Solu-Medrol and nebulizer treatments on schedule -Pulmonary toileting with incentive spirometry -Supportive care with oxygen -Last echocardiogram in 2014 since patient did respond somewhat to Lasix at home and also appears hemodilution all noting slightly decreased hemoglobin from baseline we'll obtain a repeat echocardiogram this admission -Lasix 40 mg IV every 12 hours otherwise continue usual cardiac medications as prior to admission  Active Problems:   HTN (hypertension) -Not well controlled and suspect underlying acute respiratory distress as well as possible volume overload as etiology -Continue Lasix, Cozaar and Lopressor    CKD (chronic kidney disease), stage III -Appears at baseline -Listed home medication of Anaprox-likely need to reconsider utilization of this medicine in the future    Bilateral lower extremity edema/venous stasis dermatitis -Subjectively has  slight increase in edema -Workup as above -Has sleep apnea listed as a diagnosis and did not mention she was utilizing CPAP at home so this could be controlling to chronic edema as well    Prediabetes -Hemoglobin A1c was 6.03 April 2014 -We'll follow CBGs before meals and our sleep and provide sliding scale insulin coverage as expect CBGs may increase with utilization of steroids -Check hemoglobin A1c this admission    OSA (obstructive sleep apnea) -Follow -If patient utilizes CPAP at home will need to order at hour of sleep this admission    Hyperlipidemia with target LDL less than 100 -Not all medications prior to admission    Gout -Currently not expressing symptoms consistent with exacerbation -Continue preadmission dose of allopurinol    Monoclonal paraproteinemia -Stable and followed by Dr. Alen Blew in outpatient setting, last seen December 2015    Anemia -Baseline hemoglobin appears to be between 10.8 and 11.9 -Current hemoglobin 9.1 -Suspect possibly related to hemodilution -Follow CBC -If concern not related to hemodilution recommended checking anemia panel and stools for blood    DVT Prophylaxis: Lovenox  Family Communication: Husband at bedside    Code Status: DO NOT RESUSCITATE   Condition:  Stable  Time spent in minutes : 60   ELLIS,ALLISON L. ANP on 11/16/2014 at 2:11 PM  Between 7am to 7pm - Pager - [phone]  After 7pm go to [email or web address] - password [email or web address]  And look for the night coverage person covering me after hours  [facility]

## 2014-11-16 NOTE — Progress Notes (Signed)
CRITICAL VALUE ALERT  Critical value received:  Lactic Acid- 5.0  Date of notification:  Today  Time of notification:  18:05  Critical value read back: Yes  Nurse who received alert:  Flonnie Hailstone  MD notified (1st page):  Dr. Verlon Au  Time of first page:  18:07  MD notified (2nd page):  Time of second page:  Responding MD:  Dr. Verlon Au  Time MD responded:  18:10

## 2014-11-16 NOTE — Assessment & Plan Note (Signed)
She has subacute resp distress with low O2 and elevated RR and HR today I think she needs an urgent work-up for CV and pulmonary causes and to be started on oxygen as she appears to be tiring in the office today She was sent to [facility] ER with her daughter

## 2014-11-17 DIAGNOSIS — R7309 Other abnormal glucose: Secondary | ICD-10-CM

## 2014-11-17 DIAGNOSIS — N183 Chronic kidney disease, stage 3 (moderate): Secondary | ICD-10-CM

## 2014-11-17 DIAGNOSIS — R600 Localized edema: Secondary | ICD-10-CM

## 2014-11-17 DIAGNOSIS — J441 Chronic obstructive pulmonary disease with (acute) exacerbation: Secondary | ICD-10-CM

## 2014-11-17 DIAGNOSIS — D472 Monoclonal gammopathy: Secondary | ICD-10-CM

## 2014-11-17 DIAGNOSIS — I10 Essential (primary) hypertension: Secondary | ICD-10-CM

## 2014-11-17 DIAGNOSIS — I5033 Acute on chronic diastolic (congestive) heart failure: Principal | ICD-10-CM

## 2014-11-17 LAB — CBC
HCT: 28.2 % — ABNORMAL LOW (ref 36.0–46.0)
HEMOGLOBIN: 8.3 g/dL — AB (ref 12.0–15.0)
MCH: 23 pg — AB (ref 26.0–34.0)
MCHC: 29.4 g/dL — AB (ref 30.0–36.0)
MCV: 78.1 fL (ref 78.0–100.0)
Platelets: 207 10*3/uL (ref 150–400)
RBC: 3.61 MIL/uL — ABNORMAL LOW (ref 3.87–5.11)
RDW: 16.8 % — ABNORMAL HIGH (ref 11.5–15.5)
WBC: 9.9 10*3/uL (ref 4.0–10.5)

## 2014-11-17 LAB — COMPREHENSIVE METABOLIC PANEL
ALT: 14 U/L (ref 0–35)
AST: 24 U/L (ref 0–37)
Albumin: 3 g/dL — ABNORMAL LOW (ref 3.5–5.2)
Alkaline Phosphatase: 61 U/L (ref 39–117)
Anion gap: 9 (ref 5–15)
BUN: 44 mg/dL — AB (ref 6–23)
CALCIUM: 8.7 mg/dL (ref 8.4–10.5)
CHLORIDE: 98 mmol/L (ref 96–112)
CO2: 29 mmol/L (ref 19–32)
CREATININE: 1.8 mg/dL — AB (ref 0.50–1.10)
GFR calc non Af Amer: 27 mL/min — ABNORMAL LOW (ref >90)
GFR, EST AFRICAN AMERICAN: 31 mL/min — AB (ref >90)
GLUCOSE: 167 mg/dL — AB (ref 70–99)
Potassium: 4.8 mmol/L (ref 3.5–5.1)
Sodium: 136 mmol/L (ref 135–145)
Total Bilirubin: 0.3 mg/dL (ref 0.3–1.2)
Total Protein: 7.2 g/dL (ref 6.0–8.3)

## 2014-11-17 LAB — GLUCOSE, CAPILLARY
GLUCOSE-CAPILLARY: 167 mg/dL — AB (ref 70–99)
GLUCOSE-CAPILLARY: 219 mg/dL — AB (ref 70–99)
Glucose-Capillary: 179 mg/dL — ABNORMAL HIGH (ref 70–99)
Glucose-Capillary: 214 mg/dL — ABNORMAL HIGH (ref 70–99)

## 2014-11-17 LAB — INFLUENZA PANEL BY PCR (TYPE A & B)
H1N1 flu by pcr: NOT DETECTED
INFLAPCR: NEGATIVE
Influenza B By PCR: NEGATIVE

## 2014-11-17 LAB — HEMOGLOBIN A1C
Hgb A1c MFr Bld: 6.3 % — ABNORMAL HIGH (ref 4.8–5.6)
Mean Plasma Glucose: 134 mg/dL

## 2014-11-17 MED ORDER — IPRATROPIUM-ALBUTEROL 0.5-2.5 (3) MG/3ML IN SOLN
3.0000 mL | Freq: Three times a day (TID) | RESPIRATORY_TRACT | Status: DC
  Administered 2014-11-18 – 2014-11-19 (×4): 3 mL via RESPIRATORY_TRACT
  Filled 2014-11-17 (×4): qty 3

## 2014-11-17 MED ORDER — GUAIFENESIN-DM 100-10 MG/5ML PO SYRP
5.0000 mL | ORAL_SOLUTION | ORAL | Status: DC | PRN
  Administered 2014-11-17 – 2014-11-20 (×2): 5 mL via ORAL
  Filled 2014-11-17 (×2): qty 10

## 2014-11-17 MED ORDER — HYDROCOD POLST-CPM POLST ER 10-8 MG/5ML PO SUER
5.0000 mL | Freq: Two times a day (BID) | ORAL | Status: DC
  Administered 2014-11-17 – 2014-11-19 (×6): 5 mL via ORAL
  Filled 2014-11-17 (×7): qty 5

## 2014-11-17 MED ORDER — ENSURE ENLIVE PO LIQD
237.0000 mL | Freq: Two times a day (BID) | ORAL | Status: DC
  Administered 2014-11-17 – 2014-11-23 (×11): 237 mL via ORAL

## 2014-11-17 MED ORDER — TRAMADOL HCL 50 MG PO TABS
50.0000 mg | ORAL_TABLET | Freq: Four times a day (QID) | ORAL | Status: DC | PRN
  Administered 2014-11-21: 50 mg via ORAL
  Filled 2014-11-17 (×3): qty 1

## 2014-11-17 MED ORDER — FUROSEMIDE 40 MG PO TABS
40.0000 mg | ORAL_TABLET | Freq: Two times a day (BID) | ORAL | Status: DC
  Administered 2014-11-17 – 2014-11-23 (×12): 40 mg via ORAL
  Filled 2014-11-17 (×15): qty 1

## 2014-11-17 MED ORDER — METHYLPREDNISOLONE SODIUM SUCC 40 MG IJ SOLR
40.0000 mg | Freq: Two times a day (BID) | INTRAMUSCULAR | Status: DC
  Administered 2014-11-18 – 2014-11-19 (×3): 40 mg via INTRAVENOUS
  Filled 2014-11-17 (×4): qty 1

## 2014-11-17 NOTE — Progress Notes (Signed)
OT Cancellation Note  Patient Details Name: CHASSIE PENNIX MRN: [id number] DOB: [birth year]   Cancelled Treatment:    Reason Eval/Treat Not Completed: Patient at procedure or test/ unavailable  Nonna Renninger 11/17/2014, 3:07 PM  Lesle Chris, OTR/L [phone] 11/17/2014

## 2014-11-17 NOTE — Evaluation (Signed)
Physical Therapy Evaluation Patient Details Name: LASHAUNDRA LEHRMANN MRN: [id number] DOB: [birth year] Today's Date: 11/17/2014   History of Present Illness  74 yo female admitted with acute respiratory failure. Hx of COPD, chronic LE swelling, sleep apnea, HTN, DM, gout, venous stasis dermatitis, CHF  Clinical Impression  On eval, pt required Min assist for mobility-able to ambulate ~40 feet with RW. O2 sats dropped to 89% on 2L during ambulation; dyspnea 2-3/4. Pt fatigues fairly easily. Encouraged pt to mobilize as much as possible (sitting up in chair, walking to/from bathroom with nursing supervision). Recommend HHPT-pt may not be agreeable.     Follow Up Recommendations Home health PT;Supervision/Assistance - 24 hour (initially)    Equipment Recommendations  None recommended by PT    Recommendations for Other Services OT consult     Precautions / Restrictions Precautions Precautions: Fall Precaution Comments: monitor O2 sats Restrictions Weight Bearing Restrictions: No      Mobility  Bed Mobility               General bed mobility comments: sitting EOB  Transfers Overall transfer level: Needs assistance Equipment used: Rolling walker (2 wheeled) Transfers: Sit to/from Stand Sit to Stand: Min assist         General transfer comment: Assist to rise, stabilize,control descent.   Ambulation/Gait Ambulation/Gait assistance: Min assist Ambulation Distance (Feet): 40 Feet Assistive device: Rolling walker (2 wheeled) Gait Pattern/deviations: Step-through pattern;Decreased stride length;Trunk flexed     General Gait Details: assist to stabilize. brief standing rest breaks needed during short walk. O2 sats dropped to 89% on 2L during ambulation. Dyspnea 2-3/4.   Stairs            Wheelchair Mobility    Modified Rankin (Stroke Patients Only)       Balance Overall balance assessment: Needs assistance         Standing balance support: Bilateral upper  extremity supported;During functional activity Standing balance-Leahy Scale: Poor Standing balance comment: needs RW                             Pertinent Vitals/Pain Pain Assessment: Faces Faces Pain Scale: Hurts little more Pain Location: chest Pain Descriptors / Indicators: Sore Pain Intervention(s): Monitored during session    Home Living Family/patient expects to be discharged to:: Private residence Living Arrangements: Spouse/significant other Available Help at Discharge: Family Type of Home: House Home Access: Stairs to enter Entrance Stairs-Rails: None Technical brewer of Steps: 1 Home Layout: One level Home Equipment: Environmental consultant - 2 wheels      Prior Function                 Hand Dominance        Extremity/Trunk Assessment   Upper Extremity Assessment: Generalized weakness           Lower Extremity Assessment: Generalized weakness      Cervical / Trunk Assessment: Kyphotic  Communication   Communication: No difficulties  Cognition Arousal/Alertness: Awake/alert Behavior During Therapy: WFL for tasks assessed/performed Overall Cognitive Status: Within Functional Limits for tasks assessed                      General Comments      Exercises        Assessment/Plan    PT Assessment Patient needs continued PT services  PT Diagnosis Difficulty walking;Generalized weakness;Acute pain   PT Problem List Decreased strength;Decreased activity tolerance;Decreased balance;Decreased mobility;Obesity;Decreased knowledge  of use of DME;Pain  PT Treatment Interventions DME instruction;Gait training;Functional mobility training;Therapeutic activities;Therapeutic exercise;Patient/family education;Balance training   PT Goals (Current goals can be found in the Care Plan section) Acute Rehab PT Goals Patient Stated Goal: none stated PT Goal Formulation: With patient/family Time For Goal Achievement: 12/01/14 Potential to Achieve  Goals: Good    Frequency Min 3X/week   Barriers to discharge        Co-evaluation               End of Session Equipment Utilized During Treatment: Gait belt;Oxygen Activity Tolerance: Patient limited by fatigue Patient left: in chair;with call bell/phone within reach;with family/visitor present           Time: 0952-1009 PT Time Calculation (min) (ACUTE ONLY): 17 min   Charges:   PT Evaluation $Initial PT Evaluation Tier I: 1 Procedure     PT G Codes:        Weston Anna, MPT Pager: [phone]

## 2014-11-17 NOTE — Care Management Note (Addendum)
    Page 1 of 1   11/18/2014     12:56:17 PM CARE MANAGEMENT NOTE 11/18/2014  Patient:  [name], [name]   Account Number:  [id number]  Date Initiated:  11/17/2014  Documentation initiated by:  Dessa Phi  Subjective/Objective Assessment:   74 y/o f admitted w/Acute resp failure w/hypoxia.SH:FWYO, RLE ulcer, Lung ca,CHF.     Action/Plan:   From home w/spouse.   Anticipated DC Date:  11/22/2014   Anticipated DC Plan:  [place]  CM consult      Choice offered to / List presented to:  C-1 Patient        Eugenio Saenz arranged  [place]      [place].   Status of service:  In process, will continue to follow Medicare Important Message given?   (If response is "NO", the following Medicare IM given date fields will be blank) Date Medicare IM given:   Medicare IM given by:   Date Additional Medicare IM given:   Additional Medicare IM given by:    Discharge Disposition:    Per UR Regulation:  Reviewed for med. necessity/level of care/duration of stay  If discussed at [place] of Stay Meetings, dates discussed:    Comments:  11/18/14 Dessa Phi RN BSN NCM [phone] [facility] aware of HHRN/PT order.Await d/c order.  11/17/14 Dessa Phi RN BSN NCM [phone] PT-HH.[facility] chosen for [facility].TC Kristen rep aware of HHPT order,await d/c order.WOC cons-RLE wounds. On 02 will monitor.Monitor progress for d/c needs.

## 2014-11-17 NOTE — Progress Notes (Signed)
INITIAL NUTRITION ASSESSMENT  DOCUMENTATION CODES Per approved criteria  -Morbid Obesity   INTERVENTION: - Continue current diet - Will order Ensure Enlive po BID, each supplement provides 350 kcal and 20 grams of protein   NUTRITION DIAGNOSIS: Difficulty swallowing related to difficulty breathing during meal times as evidenced by pt report, hx of COPD.   Goal: Pt to meet >/= 90% estimated needs  Monitor:  Intakes of meals and supplement, weight trends, labs, I/O's  Reason for Assessment: Consult  74 y.o. female  Admitting Dx: Acute respiratory failure with hypoxia  ASSESSMENT: Pt is a 74 year old who was admitted for acute resp failure with hypoxia in the setting of hx of COPD. She also has hx of DM and cancer. Pt with chronic RLE venous stasis ulcer x2 years. Pt reports hx of umbilical hernia and ulcerative colitis.  Pt see for consult for assessment of nutrition requirements and status. She reports poor appetite x1 week with decreased intakes. She states that she was having abdominal pain during this time which she associates with her hernia and ulcerative colitis. She states that she would drink an Atkins protein shake for breakfast, eat a sandwich or salad for lunch, and typically skip dinner. Pt denies chewing issues, but states she sometimes has difficulty with swallowing. She states that when she takes her first bite or drink at a meal she feels like it gets stuck in her esophagus and like she is going to choke.   She reports her UBW as 230 lbs and states she often retains fluid. Pt reports she takes Lasix at home which usually helps but recently it has not seemed to keep the fluid off and that her kidneys were not working as well.  Pt likely not meeting needs at this time. Physical assessment does not show signs of wasting but does indicate severe edema to bottom of BLE. Labs and medications reviewed; BUN and creatinine elevated, GFR: 27.  Height: Ht Readings from Last 1  Encounters:  11/16/14 5' 3.25" (1.607 m)    Weight: Wt Readings from Last 1 Encounters:  11/17/14 235 lb 14.3 oz (107 kg)    Ideal Body Weight: 115 lbs (52.27 kg)  % Ideal Body Weight: 204%  Wt Readings from Last 10 Encounters:  11/17/14 235 lb 14.3 oz (107 kg)  11/16/14 235 lb (106.595 kg)  07/07/14 244 lb 4.8 oz (110.814 kg)  04/22/14 244 lb (110.678 kg)  04/14/14 243 lb (110.224 kg)  11/25/13 240 lb 8 oz (109.09 kg)  10/14/13 239 lb 8 oz (108.636 kg)  08/31/13 236 lb (107.049 kg)  06/22/13 232 lb 12 oz (105.575 kg)  06/05/13 237 lb 10.5 oz (107.8 kg)    Usual Body Weight: 230 lbs (104.54 kg)  % Usual Body Weight: 102%  BMI:  Body mass index is 41.43 kg/(m^2).  Estimated Nutritional Needs: Kcal: 1600-1800  Protein: 100-120 grams Fluid: <1.5L/day  Skin: chronic RLE venous stasis ulcer, ecchymosis to BLE  Diet Order: Diet Heart Room service appropriate?: Yes; Fluid consistency:: Thin  EDUCATION NEEDS: -No education needs identified at this time   Intake/Output Summary (Last 24 hours) at 11/17/14 1016 Last data filed at 11/17/14 0930  Gross per 24 hour  Intake    580 ml  Output   1300 ml  Net   -720 ml    Last BM: PTA   Labs:   Recent Labs Lab 11/16/14 1051 11/17/14 0338  NA 136 136  K 4.0 4.8  CL 101 98  CO2 26 29  BUN 31* 44*  CREATININE 1.60* 1.80*  CALCIUM 8.9 8.7  GLUCOSE 126* 167*    CBG (last 3)   Recent Labs  11/16/14 1742 11/16/14 2108  GLUCAP 246* 218*    Scheduled Meds: . allopurinol  100 mg Oral Daily  . antiseptic oral rinse  7 mL Mouth Rinse q12n4p  . aspirin EC  81 mg Oral Daily  . benzonatate  200 mg Oral TID  . Fluticasone Furoate-Vilanterol  1 puff Inhalation Daily  . furosemide  40 mg Intravenous BID  . heparin  5,000 Units Subcutaneous 3 times per day  . insulin aspart  0-5 Units Subcutaneous QHS  . insulin aspart  0-9 Units Subcutaneous TID WC  . ipratropium-albuterol  3 mL Nebulization Q4H  . levofloxacin  (LEVAQUIN) IV  500 mg Intravenous Q24H  . losartan  100 mg Oral Daily  . methylPREDNISolone (SOLU-MEDROL) injection  60 mg Intravenous Q6H  . metoprolol  50 mg Oral BID  . oseltamivir  30 mg Oral BID  . sodium chloride  3 mL Intravenous Q12H  . sodium chloride  3 mL Intravenous Q12H    Continuous Infusions:   Past Medical History  Diagnosis Date  . HTN (hypertension) 07/30/1988  . GERD (gastroesophageal reflux disease) 07/30/92  . COPD (chronic obstructive pulmonary disease) 01/27/98  . Hyperlipidemia 11/27/96  . CHF (congestive heart failure) 08/31/03  . Gout   . Renal insufficiency   . Kidney stone 1960, 1972, 1991  . Ulcerative colitis     Past Surgical History  Procedure Laterality Date  . No past surgeries      Jarome Matin, RD, LDN Inpatient Clinical Dietitian Pager # [phone] After hours/weekend pager # [phone]

## 2014-11-17 NOTE — Progress Notes (Signed)
PT states she does not have Breo with her at this time (medication shows on Worklist but not MAR).

## 2014-11-17 NOTE — Progress Notes (Signed)
  Echocardiogram 2D Echocardiogram has been performed.  [name] 11/17/2014, 3:17 PM

## 2014-11-17 NOTE — Progress Notes (Signed)
CARE MANAGEMENT NOTE 11/17/2014  Patient:  TAQUISHA, PHUNG   Account Number:  [id number]  Date Initiated:  11/17/2014  Documentation initiated by:  Dessa Phi  Subjective/Objective Assessment:   74 y/o f admitted w/Acute resp failure w/hypoxia.KG:MWNU, RLE ulcer, Lung ca,CHF.     Action/Plan:   From home w/spouse.   Anticipated DC Date:  11/22/2014   Anticipated DC Plan:  [place]  CM consult      Choice offered to / List presented to:  C-1 Patient        [place] arranged  [place] PT      [place].   Status of service:  In process, will continue to follow Medicare Important Message given?   (If response is "NO", the following Medicare IM given date fields will be blank) Date Medicare IM given:   Medicare IM given by:   Date Additional Medicare IM given:   Additional Medicare IM given by:    Discharge Disposition:    Per UR Regulation:  Reviewed for med. necessity/level of care/duration of stay  If discussed at [place] of Stay Meetings, dates discussed:    Comments:  11/17/14 Dessa Phi RN BSN NCM [phone] PT-HH.[facility] chosen for [facility].TC Kristen rep aware of HHPT order,await d/c order.WOC cons-RLE wounds. On 02 will monitor.Monitor progress for d/c needs.

## 2014-11-17 NOTE — Progress Notes (Signed)
Progress Note   [name] RWE:[id number] DOB: Nov 29, 1940 DOA: 11/16/2014 PCP: [name], MD   Brief Narrative:   [name] is an 74 y.o. female with a PMH of MGUS, chronic nonhealing right lower extremity ulcer/cellulitis, chronic diastolic CHF, hypertension and stage III CKD sent to the ED for/19/16 by her PCP for evaluation of worsening upper respiratory symptoms including shortness of breath, cough, and rhinorrhea.  Assessment/Plan:   Principal Problem:  Acute respiratory failure with hypoxia: A) COPD exacerbation B) Acute on chronic diastolic CHF C) Probable viral URI - Continue Levaquin to cover for tracheobronchitis. - Influenza panel negative, discontinue Tamiflu. Follow-up respiratory virus panel. - Continue IV Solu-Medrol and nebulizer treatments on schedule. Begin to wean steroids. No appreciable bronchospasm. - Continue Pulmonary toileting with incentive spirometry. - Continue supportive care with oxygen. - Last echocardiogram in 2014 showed EF 55-60 %, repeat echo shows EF 60-65 %t/grade 1 diastolic dysfunction. - Discontinue IV Lasix resume home oral dose. - Tussionex added for cough.  Active Problems:  HTN (hypertension) - Currently stable. - Continue Lasix, Cozaar and Lopressor.   CKD (chronic kidney disease), stage III - Current creatinine appears at usual baseline values. - Listed home medication of Anaprox-likely need to reconsider utilization of this medicine in the future.   Bilateral lower extremity edema/venous stasis dermatitis - Subjectively has slight increase in edema, diuresed.   Prediabetes / steroid-induced hyperglycemia - Hemoglobin A1c was 6.03 April 2014 currently 6.3. - Continue to monitor CBGs QAC/HS and provide SSI, CBGs mid 200s.   OSA (obstructive sleep apnea) - Stable.   Hyperlipidemia with target LDL less than 100 - Not on medications prior to admission.   Gout - Currently not expressing symptoms  consistent with exacerbation. - Continue preadmission dose of allopurinol.   Monoclonal paraproteinemia - Stable and followed by Dr. Alen Blew in outpatient setting, last seen December 2015.   Anemia - Baseline hemoglobin appears to be between 10.8 and 11.9. - Current hemoglobin 9.1. - Suspect possibly related to hemodilution.    DVT Prophylaxis - Heparin SQ.  Code Status: DNR Family Communication: Daughter, Coralyn Mark at bedside. Disposition Plan: Home in 2 days, when stable.  Lives with husband.  Works as a Scientist, water quality at [employer].   IV Access:    Peripheral IV   Procedures and diagnostic studies:   Dg Chest 2 View (if Patient Has Fever And/or Copd)  11/16/2014   CLINICAL DATA:  Cough and wheezing since Saturday, shortness of breath, history COPD, hypertension, former smoker  EXAM: CHEST  2 VIEW  COMPARISON:  04/22/2014  FINDINGS: Minimal enlargement of cardiac silhouette with pulmonary vascular congestion.  Atherosclerotic calcification aorta.  Mediastinal contours normal.  Chronic mild accentuation of perihilar markings and minimal chronic peribronchial thickening.  Bibasilar atelectasis.  No definite acute infiltrate, pleural effusion or pneumothorax.  Bones demineralized.  IMPRESSION: Enlargement of cardiac silhouette with pulmonary vascular congestion.  Chronic bronchitic changes with bibasilar atelectasis.   Electronically Signed   By: Lavonia Dana M.D.   On: 11/16/2014 11:08     Medical Consultants:    None.  Anti-Infectives:    Levaquin 11/16/14--->  Subjective:   [name] reports that she has a sore throat, pleuritic pain, pain with coughing.  Still short of breath.  She says her wheezing is better.  Had nausea yesterday, none today.  Last BM was 2 days ago.  Appetite poor.   Objective:    Filed Vitals:   11/16/14 2100 11/17/14 6195  11/17/14 0455 11/17/14 0726  BP: 149/43  141/42   Pulse: 75  72   Temp: 98.4 F (36.9 C)  98.4 F (36.9 C)   TempSrc: Oral   Oral   Resp: 20  118   Height:      Weight:   107 kg (235 lb 14.3 oz)   SpO2: 93% 95% 95% 91%    Intake/Output Summary (Last 24 hours) at 11/17/14 0851 Last data filed at 11/17/14 0114  Gross per 24 hour  Intake    340 ml  Output    800 ml  Net   -460 ml    Exam: Gen:  NAD, ill-appearing Cardiovascular:  RRR, No M/R/G Respiratory:  Lungs CTAB, no wheeze Gastrointestinal:  Abdomen soft, NT/ND, + BS Extremities:  2+ edema with venous stasis changes/hemosiderin deposits   Data Reviewed:    Labs: Basic Metabolic Panel:  Recent Labs Lab 11/16/14 1051 11/17/14 0338  NA 136 136  K 4.0 4.8  CL 101 98  CO2 26 29  GLUCOSE 126* 167*  BUN 31* 44*  CREATININE 1.60* 1.80*  CALCIUM 8.9 8.7   GFR Estimated Creatinine Clearance: 32.3 mL/min (by C-G formula based on Cr of 1.8). Liver Function Tests:  Recent Labs Lab 11/17/14 0338  AST 24  ALT 14  ALKPHOS 61  BILITOT 0.3  PROT 7.2  ALBUMIN 3.0*   CBC:  Recent Labs Lab 11/16/14 1051 11/17/14 0338  WBC 11.3* 9.9  HGB 9.1* 8.3*  HCT 30.6* 28.2*  MCV 77.9* 78.1  PLT 248 207   CBG:  Recent Labs Lab 11/16/14 1742 11/16/14 2108  GLUCAP 246* 218*   Hgb A1c:  Recent Labs  11/16/14 1425  HGBA1C 6.3*   Sepsis Labs:  Recent Labs Lab 11/16/14 1051 11/16/14 1057 11/16/14 1717 11/16/14 1930 11/17/14 0338  WBC 11.3*  --   --   --  9.9  LATICACIDVEN  --  2.65* 5.0* 4.4*  --    Microbiology No results found for this or any previous visit (from the past 240 hour(s)).   Medications:   . allopurinol  100 mg Oral Daily  . antiseptic oral rinse  7 mL Mouth Rinse q12n4p  . aspirin EC  81 mg Oral Daily  . benzonatate  200 mg Oral TID  . Fluticasone Furoate-Vilanterol  1 puff Inhalation Daily  . furosemide  40 mg Intravenous BID  . heparin  5,000 Units Subcutaneous 3 times per day  . insulin aspart  0-5 Units Subcutaneous QHS  . insulin aspart  0-9 Units Subcutaneous TID WC  . ipratropium-albuterol  3  mL Nebulization Q4H  . levofloxacin (LEVAQUIN) IV  500 mg Intravenous Q24H  . losartan  100 mg Oral Daily  . methylPREDNISolone (SOLU-MEDROL) injection  60 mg Intravenous Q6H  . metoprolol  50 mg Oral BID  . oseltamivir  30 mg Oral BID  . sodium chloride  3 mL Intravenous Q12H  . sodium chloride  3 mL Intravenous Q12H   Continuous Infusions:   Time spent: 35 minutes with > 50% of time discussing current diagnostic test results, clinical impression and plan of care.    LOS: 1 day   Sartaj Hoskin  [facility] Pager [phone]. If unable to reach me by pager, please call my cell phone at [phone].  *Please refer to [email or web address], password [email or web address] to get updated schedule on who will round on this patient, as hospitalists switch teams weekly. If 7PM-7AM, please contact night-coverage at [email or web address], password [email or web address] for any overnight  needs.  11/17/2014, 8:51 AM

## 2014-11-18 DIAGNOSIS — J449 Chronic obstructive pulmonary disease, unspecified: Secondary | ICD-10-CM

## 2014-11-18 DIAGNOSIS — J211 Acute bronchiolitis due to human metapneumovirus: Secondary | ICD-10-CM | POA: Diagnosis present

## 2014-11-18 DIAGNOSIS — G4733 Obstructive sleep apnea (adult) (pediatric): Secondary | ICD-10-CM

## 2014-11-18 LAB — BASIC METABOLIC PANEL
Anion gap: 7 (ref 5–15)
BUN: 58 mg/dL — ABNORMAL HIGH (ref 6–23)
CO2: 30 mmol/L (ref 19–32)
Calcium: 8.7 mg/dL (ref 8.4–10.5)
Chloride: 96 mmol/L (ref 96–112)
Creatinine, Ser: 1.79 mg/dL — ABNORMAL HIGH (ref 0.50–1.10)
GFR calc Af Amer: 31 mL/min — ABNORMAL LOW (ref >90)
GFR calc non Af Amer: 27 mL/min — ABNORMAL LOW (ref >90)
GLUCOSE: 148 mg/dL — AB (ref 70–99)
POTASSIUM: 5 mmol/L (ref 3.5–5.1)
SODIUM: 133 mmol/L — AB (ref 135–145)

## 2014-11-18 LAB — RESPIRATORY VIRUS PANEL
Adenovirus: NEGATIVE
Influenza A: NEGATIVE
Influenza B: NEGATIVE
Metapneumovirus: POSITIVE — AB
Parainfluenza 1: NEGATIVE
Parainfluenza 2: NEGATIVE
Parainfluenza 3: NEGATIVE
RESPIRATORY SYNCYTIAL VIRUS A: NEGATIVE
RHINOVIRUS: NEGATIVE
Respiratory Syncytial Virus B: NEGATIVE

## 2014-11-18 LAB — CBC
HCT: 28.7 % — ABNORMAL LOW (ref 36.0–46.0)
HEMOGLOBIN: 8.3 g/dL — AB (ref 12.0–15.0)
MCH: 22.9 pg — AB (ref 26.0–34.0)
MCHC: 28.9 g/dL — ABNORMAL LOW (ref 30.0–36.0)
MCV: 79.3 fL (ref 78.0–100.0)
PLATELETS: 224 10*3/uL (ref 150–400)
RBC: 3.62 MIL/uL — ABNORMAL LOW (ref 3.87–5.11)
RDW: 16.8 % — AB (ref 11.5–15.5)
WBC: 18.8 10*3/uL — ABNORMAL HIGH (ref 4.0–10.5)

## 2014-11-18 LAB — GLUCOSE, CAPILLARY
GLUCOSE-CAPILLARY: 324 mg/dL — AB (ref 70–99)
GLUCOSE-CAPILLARY: 181 mg/dL — AB (ref 70–99)
GLUCOSE-CAPILLARY: 203 mg/dL — AB (ref 70–99)
Glucose-Capillary: 138 mg/dL — ABNORMAL HIGH (ref 70–99)

## 2014-11-18 NOTE — Consult Note (Signed)
WOC wound consult note Reason for Consult: RLE venous stasis ulcer Pt with longstanding history of venous stasis, had compression wraps last in 2013 per patient and after which time they were DCEd new ulcer appeared. Current ulcer present per patient 3 years.  Explained rationale for compression therapy and she is in agreement to restart therapy on the RLE where she has chronic venous stasis ulceration Wound type: venous stasis Pressure Ulcer POA: No Measurement: 4cm x 5cm x 0.2cm  Wound bed: pink, with thin fibrin over most of the wound Drainage (amount, consistency, odor) moderate, yellow, no odor Periwound: dry, with hemosiderin staining bilaterally Dressing procedure/placement/frequency: Silicone foam to protect and insulate wound bed and absorb excess exudate.  Ortho tech to apply Unna's boot to the RLE. Discussed with patient and bedside nurse. Bedside nurse to page ortho tech when supplies arrive to the floor.  Will need HHRN for 2x wk Unna's boot application and to monitor status of wound, assist with long term compression hose at time of ulceration healed.   Discussed POC with patient and bedside nurse.  Re consult if needed, will not follow at this time. Thanks  [name], [place] [phone])

## 2014-11-18 NOTE — Progress Notes (Signed)
Progress Note   [name] OVZ:[id number] DOB: Dec 27, 1940 DOA: 11/16/2014 PCP: [name], MD   Brief Narrative:   [name] is an 74 y.o. female with a PMH of MGUS, chronic nonhealing right lower extremity ulcer/cellulitis, chronic diastolic CHF, hypertension and stage III CKD sent to the ED for/19/16 by her PCP for evaluation of worsening upper respiratory symptoms including shortness of breath, cough, and rhinorrhea.  Assessment/Plan:   Principal Problem:  Acute respiratory failure with hypoxia/COPD exacerbation/viral URI/acute on chronic diastolic CHF - Discontinue Levaquin, patient positive for metapneumovirus. - Continue to wean Solu-Medrol as tolerated. Continue bronchodilators. - Continue Pulmonary toileting with incentive spirometry. - Continue supportive care with oxygen. - Last echocardiogram in 2014 showed EF 55-60 %, repeat echo shows EF 60-65 %/grade 1 diastolic dysfunction. - Continue Tussionex for cough.  Active Problems:  HTN (hypertension) - Currently stable. - Continue Lasix, Cozaar and Lopressor.   CKD (chronic kidney disease), stage III - Current creatinine appears at usual baseline values. - Listed home medication of Anaprox-likely need to reconsider utilization of this medicine in the future.   Bilateral lower extremity edema/venous stasis dermatitis - Subjectively has slight increase in edema, diuresed.   Prediabetes / steroid-induced hyperglycemia - Hemoglobin A1c was 6.03 April 2014 currently 6.3. - Continue to monitor CBGs QAC/HS and provide SSI, CBGs mid 167-219.   OSA (obstructive sleep apnea) - Stable.  Does not use CPAP.  Did not follow up for sleep study, may need to re-schedule when stable.   Hyperlipidemia with target LDL less than 100 - Not on medications prior to admission.   Gout - Currently not expressing symptoms consistent with exacerbation. - Continue preadmission dose of allopurinol.   Monoclonal  paraproteinemia - Stable and followed by Dr. Alen Blew in outpatient setting, last seen December 2015.   Anemia - Baseline hemoglobin appears to be between 10.8 and 11.9. - Current hemoglobin 8.3. - Suspect possibly related to hemodilution.    DVT Prophylaxis - Heparin SQ.  Code Status: DNR Family Communication: Daughter, Coralyn Mark and husband, Ed at bedside. Disposition Plan: Home in 1-2 days, if respiratory status stable.  Lives with husband.  Works as a Scientist, water quality at [employer].   IV Access:    Peripheral IV   Procedures and diagnostic studies:   Dg Chest 2 View  11/16/2014: Enlargement of cardiac silhouette with pulmonary vascular congestion.  Chronic bronchitic changes with bibasilar atelectasis.     2D echo 11/17/14: Study Conclusions  - Left ventricle: The cavity size was normal. Wall thickness was normal. Systolic function was normal. The estimated ejection fraction was in the range of 60% to 65%. Wall motion was normal; there were no regional wall motion abnormalities. Doppler parameters are consistent with abnormal left ventricular relaxation (grade 1 diastolic dysfunction). - Left atrium: The atrium was moderately dilated. - Right atrium: The atrium was mildly to moderately dilated.  Medical Consultants:    None.  Anti-Infectives:    Levaquin 11/16/14--->  Subjective:   [name] reports that she has ongoing sore throat, pleuritic pain, pain with coughing---but coughing less with Tussionex.  Still short of breath.  Appetite poor.   Objective:    Filed Vitals:   11/17/14 2048 11/17/14 2107 11/18/14 0608 11/18/14 0715  BP: 131/41 131/41 147/55   Pulse: 70 70 59   Temp: 97.7 F (36.5 C)  97.4 F (36.3 C)   TempSrc: Oral  Oral   Resp: 20  20   Height:  Weight:   106.9 kg (235 lb 10.8 oz)   SpO2: 92%  97% 96%    Intake/Output Summary (Last 24 hours) at 11/18/14 0747 Last data filed at 11/18/14 0039  Gross per 24 hour  Intake    580 ml   Output   1500 ml  Net   -920 ml    Exam: Gen:  NAD, ill-appearing, sitting up in chair but no energy Cardiovascular:  RRR, No M/R/G Respiratory:  Lungs CTAB, no wheeze Gastrointestinal:  Abdomen soft, NT/ND, + BS Extremities:  2+ edema with venous stasis changes/hemosiderin deposits   Data Reviewed:    Labs: Basic Metabolic Panel:  Recent Labs Lab 11/16/14 1051 11/17/14 0338 11/18/14 0350  NA 136 136 133*  K 4.0 4.8 5.0  CL 101 98 96  CO2 26 29 30   GLUCOSE 126* 167* 148*  BUN 31* 44* 58*  CREATININE 1.60* 1.80* 1.79*  CALCIUM 8.9 8.7 8.7   GFR Estimated Creatinine Clearance: 32.5 mL/min (by C-G formula based on Cr of 1.79). Liver Function Tests:  Recent Labs Lab 11/17/14 0338  AST 24  ALT 14  ALKPHOS 61  BILITOT 0.3  PROT 7.2  ALBUMIN 3.0*   CBC:  Recent Labs Lab 11/16/14 1051 11/17/14 0338 11/18/14 0350  WBC 11.3* 9.9 18.8*  HGB 9.1* 8.3* 8.3*  HCT 30.6* 28.2* 28.7*  MCV 77.9* 78.1 79.3  PLT 248 207 224   CBG:  Recent Labs Lab 11/17/14 0748 11/17/14 1206 11/17/14 1703 11/17/14 2143 11/18/14 0740  GLUCAP 179* 214* 167* 219* 138*   Hgb A1c:  Recent Labs  11/16/14 1425  HGBA1C 6.3*   Sepsis Labs:  Recent Labs Lab 11/16/14 1051 11/16/14 1057 11/16/14 1717 11/16/14 1930 11/17/14 0338 11/18/14 0350  WBC 11.3*  --   --   --  9.9 18.8*  LATICACIDVEN  --  2.65* 5.0* 4.4*  --   --    Microbiology Recent Results (from the past 240 hour(s))  Respiratory virus panel     Status: Abnormal   Collection Time: 11/16/14  3:38 PM  Result Value Ref Range Status   Respiratory Syncytial Virus A Negative Negative Final   Respiratory Syncytial Virus B Negative Negative Final   Influenza A Negative Negative Final   Influenza B Negative Negative Final   Parainfluenza 1 Negative Negative Final   Parainfluenza 2 Negative Negative Final   Parainfluenza 3 Negative Negative Final   Metapneumovirus Positive (A) Negative Final   Rhinovirus  Negative Negative Final   Adenovirus Negative Negative Final    Comment: (NOTE) Performed At: [facility] [address] [place], [place] [id number] Lindon Romp MD GU:[id number]      Medications:   . allopurinol  100 mg Oral Daily  . antiseptic oral rinse  7 mL Mouth Rinse q12n4p  . aspirin EC  81 mg Oral Daily  . chlorpheniramine-HYDROcodone  5 mL Oral Q12H  . feeding supplement (ENSURE ENLIVE)  237 mL Oral BID BM  . Fluticasone Furoate-Vilanterol  1 puff Inhalation Daily  . furosemide  40 mg Oral BID  . heparin  5,000 Units Subcutaneous 3 times per day  . insulin aspart  0-5 Units Subcutaneous QHS  . insulin aspart  0-9 Units Subcutaneous TID WC  . ipratropium-albuterol  3 mL Nebulization TID  . levofloxacin (LEVAQUIN) IV  500 mg Intravenous Q24H  . losartan  100 mg Oral Daily  . methylPREDNISolone (SOLU-MEDROL) injection  40 mg Intravenous Q12H  . metoprolol  50 mg Oral  BID  . sodium chloride  3 mL Intravenous Q12H   Continuous Infusions:   Time spent: 25 minutes.    LOS: 2 days   Brynn Reznik  [facility] Pager [phone]. If unable to reach me by pager, please call my cell phone at [phone].  *Please refer to [email or web address], password [email or web address] to get updated schedule on who will round on this patient, as hospitalists switch teams weekly. If 7PM-7AM, please contact night-coverage at [email or web address], password [email or web address] for any overnight needs.  11/18/2014, 7:47 AM

## 2014-11-18 NOTE — Evaluation (Addendum)
Occupational Therapy Evaluation Patient Details Name: [name] MRN: [id number] DOB: 08-13-1940 Today's Date: 11/18/2014    History of Present Illness 74 yo female admitted with acute respiratory failure. Hx of COPD, chronic LE swelling, sleep apnea, HTN, DM, gout, venous stasis dermatitis, CHF   Clinical Impression   Pt very weak in standing and required min assist throughout standing and pivot to chair to maintain balance. Pt reports feeling shaky and weaker today. Feel she will need SNF at d/c as pt is home alone much of the day as husband volunteers and is gone a lot. Discussed SNF option with pt and granddaughter. Will follow to progress ADL independence.     Follow Up Recommendations  SNF;Supervision/Assistance - 24 hour    Equipment Recommendations  3 in 1 bedside comode    Recommendations for Other Services       Precautions / Restrictions Precautions Precautions: Fall Precaution Comments: monitor O2 sats Restrictions Weight Bearing Restrictions: No      Mobility Bed Mobility Overal bed mobility: Needs Assistance Bed Mobility: Supine to Sit     Supine to sit: Supervision        Transfers Overall transfer level: Needs assistance Equipment used: Rolling walker (2 wheeled) Transfers: Sit to/from Stand Sit to Stand: Min assist         General transfer comment: pt unsteady with sit to stand and min assist to steady. cues for hand placement.    Balance                                            ADL Overall ADL's : Needs assistance/impaired Eating/Feeding: Independent;Sitting   Grooming: Wash/dry hands;Set up;Sitting   Upper Body Bathing: Set up;Sitting;Supervision/ safety   Lower Body Bathing: Maximal assistance;Sit to/from stand   Upper Body Dressing : Set up;Sitting   Lower Body Dressing: Maximal assistance;Sit to/from stand   Toilet Transfer: Minimal assistance;Stand-pivot;RW   Toileting- Clothing Manipulation and  Hygiene: Maximal assistance;Sit to/from stand         General ADL Comments: Pt states she feels shaky with sitting EOB. Sat EOB several minutes. O2 on 2L sitting EOB 93-95%. Took O2 off briefly but O2 dropped to mid 70s to 80s on RA. Reapplied O2 and O2 back up to 90s. Pt stood X 1 but very weak and wobbly on her feet in standing. Sat back down and rested several more minutes and then pivoted to chair with O2 and walker with min assist and increased time. Pt still very unsteady. Discussed SNF option as pt would be home alone much of the day. Her husband is gone much of the day. Granddaughter present and would like for pt to consider SNF also. Currently pt weak in standing and not able to let go of the walker to perform hygiene, washing etc in standing. Educated pt on purse lip breathing techniques.      Vision     Perception     Praxis      Pertinent Vitals/Pain Pain Assessment: Faces Faces Pain Scale: Hurts a little bit Pain Location: chest Pain Descriptors / Indicators: Sore Pain Intervention(s): Monitored during session     Hand Dominance     Extremity/Trunk Assessment Upper Extremity Assessment Upper Extremity Assessment: Generalized weakness           Communication Communication Communication: No difficulties   Cognition Arousal/Alertness: Awake/alert Behavior During Therapy:  WFL for tasks assessed/performed Overall Cognitive Status: Within Functional Limits for tasks assessed                     General Comments       Exercises       Shoulder Instructions      Home Living Family/patient expects to be discharged to:: Private residence Living Arrangements: Spouse/significant other Available Help at Discharge: Family Type of Home: House Home Access: Stairs to enter Technical brewer of Steps: 1 Entrance Stairs-Rails: None Home Layout: One level     Bathroom Shower/Tub: Teacher, early years/pre: [place]:  Walker - 2 wheels;Grab bars - toilet;Grab bars - tub/shower;Tub bench          Prior Functioning/Environment Level of Independence: Independent             OT Diagnosis: Generalized weakness   OT Problem List: Decreased strength;Decreased knowledge of use of DME or AE   OT Treatment/Interventions: Self-care/ADL training;Patient/family education;Therapeutic activities;DME and/or AE instruction    OT Goals(Current goals can be found in the care plan section) Acute Rehab OT Goals Patient Stated Goal: none stated OT Goal Formulation: With patient/family Time For Goal Achievement: 11/25/14 Potential to Achieve Goals: Good  OT Frequency: Min 2X/week   Barriers to D/C:            Co-evaluation              End of Session Equipment Utilized During Treatment: Rolling walker;Oxygen  Activity Tolerance: Patient limited by fatigue Patient left: in chair;with call bell/phone within reach;with chair alarm set;with family/visitor present   Time: 4656-8127 OT Time Calculation (min): 37 min Charges:  OT General Charges $OT Visit: 1 Procedure OT Evaluation $Initial OT Evaluation Tier I: 1 Procedure OT Treatments $Therapeutic Activity: 8-22 mins G-Codes:    Jules Schick  [phone] 11/18/2014, 10:44 AM

## 2014-11-19 DIAGNOSIS — L97909 Non-pressure chronic ulcer of unspecified part of unspecified lower leg with unspecified severity: Secondary | ICD-10-CM

## 2014-11-19 DIAGNOSIS — I83009 Varicose veins of unspecified lower extremity with ulcer of unspecified site: Secondary | ICD-10-CM

## 2014-11-19 LAB — BASIC METABOLIC PANEL
ANION GAP: 6 (ref 5–15)
BUN: 73 mg/dL — AB (ref 6–23)
CALCIUM: 9.3 mg/dL (ref 8.4–10.5)
CO2: 36 mmol/L — AB (ref 19–32)
CREATININE: 1.66 mg/dL — AB (ref 0.50–1.10)
Chloride: 97 mmol/L (ref 96–112)
GFR calc Af Amer: 34 mL/min — ABNORMAL LOW (ref >90)
GFR calc non Af Amer: 29 mL/min — ABNORMAL LOW (ref >90)
GLUCOSE: 181 mg/dL — AB (ref 70–99)
Potassium: 4.6 mmol/L (ref 3.5–5.1)
Sodium: 139 mmol/L (ref 135–145)

## 2014-11-19 LAB — CBC
HEMATOCRIT: 31.2 % — AB (ref 36.0–46.0)
Hemoglobin: 8.8 g/dL — ABNORMAL LOW (ref 12.0–15.0)
MCH: 22.8 pg — ABNORMAL LOW (ref 26.0–34.0)
MCHC: 28.2 g/dL — ABNORMAL LOW (ref 30.0–36.0)
MCV: 80.8 fL (ref 78.0–100.0)
PLATELETS: 272 10*3/uL (ref 150–400)
RBC: 3.86 MIL/uL — AB (ref 3.87–5.11)
RDW: 16.8 % — ABNORMAL HIGH (ref 11.5–15.5)
WBC: 17 10*3/uL — AB (ref 4.0–10.5)

## 2014-11-19 LAB — GLUCOSE, CAPILLARY
Glucose-Capillary: 150 mg/dL — ABNORMAL HIGH (ref 70–99)
Glucose-Capillary: 227 mg/dL — ABNORMAL HIGH (ref 70–99)
Glucose-Capillary: 255 mg/dL — ABNORMAL HIGH (ref 70–99)
Glucose-Capillary: 170 mg/dL — ABNORMAL HIGH (ref 70–99)

## 2014-11-19 MED ORDER — IPRATROPIUM-ALBUTEROL 0.5-2.5 (3) MG/3ML IN SOLN
3.0000 mL | Freq: Four times a day (QID) | RESPIRATORY_TRACT | Status: DC
  Administered 2014-11-19 – 2014-11-23 (×13): 3 mL via RESPIRATORY_TRACT
  Filled 2014-11-19 (×14): qty 3

## 2014-11-19 MED ORDER — PHENOL 1.4 % MT LIQD
1.0000 | OROMUCOSAL | Status: DC | PRN
  Administered 2014-11-19: 1 via OROMUCOSAL
  Filled 2014-11-19: qty 177

## 2014-11-19 MED ORDER — MENTHOL 3 MG MT LOZG
1.0000 | LOZENGE | OROMUCOSAL | Status: DC | PRN
  Filled 2014-11-19: qty 9

## 2014-11-19 MED ORDER — METHYLPREDNISOLONE SODIUM SUCC 125 MG IJ SOLR
60.0000 mg | Freq: Two times a day (BID) | INTRAMUSCULAR | Status: DC
  Administered 2014-11-19 – 2014-11-21 (×4): 60 mg via INTRAVENOUS
  Filled 2014-11-19: qty 2
  Filled 2014-11-19 (×2): qty 0.96
  Filled 2014-11-19: qty 2
  Filled 2014-11-19 (×3): qty 0.96

## 2014-11-19 NOTE — Progress Notes (Addendum)
Progress Note   [name] YBO:[id number] DOB: 20-Sep-1940 DOA: 11/16/2014 PCP: [name], MD   Brief Narrative:   [name] is an 74 y.o. female with a PMH of MGUS, chronic nonhealing right lower extremity ulcer/cellulitis, chronic diastolic CHF, hypertension and stage III CKD sent to the ED for/19/16 by her PCP for evaluation of worsening upper respiratory symptoms including shortness of breath, cough, and rhinorrhea.  Assessment/Plan:   Principal Problem:  Acute respiratory failure with hypoxia/COPD exacerbation/viral URI/acute on chronic diastolic CHF - Respiratory virus panel positive for metapneumovirus. Empiric Levaquin discontinued 11/18/14. - Continue Solu-Medrol, increase back to 60 mg Q 12 hours. Continue bronchodilators, increase to QID. - Continue Pulmonary toileting with incentive spirometry. - Continue supportive care with oxygen. - Last echocardiogram in 2014 showed EF 55-60 %, repeat echo shows EF 60-65 %/grade 1 diastolic dysfunction. - Continue Tussionex for cough.  Active Problems:   Venous stasis ulcer - Status post wound care RN evaluation 2/77/82. - Silicone foam dressing/Unna boots to the right lower extremity ordered. - We'll need home health RN for [employer] changes twice per week and to monitor wound post discharge.   HTN (hypertension) - Currently stable. - Continue Lasix, Cozaar and Lopressor.   CKD (chronic kidney disease), stage III - Current creatinine appears at usual baseline values. - Listed home medication of Anaprox-likely need to reconsider utilization of this medicine in the future.   Bilateral lower extremity edema/venous stasis dermatitis - Subjectively has slight increase in edema, diuresed.   Prediabetes / steroid-induced hyperglycemia - Hemoglobin A1c was 6.03 April 2014 currently 6.3. - Continue to monitor CBGs QAC/HS and provide SSI, CBGs 150-324.   OSA (obstructive sleep apnea) - Stable.  Does not use  CPAP.  Did not follow up for sleep study, may need to re-schedule when stable.   Hyperlipidemia with target LDL less than 100 - Not on medications prior to admission.   Gout - Currently not expressing symptoms consistent with exacerbation. - Continue preadmission dose of allopurinol.   Monoclonal paraproteinemia - Stable and followed by Dr. Alen Blew in outpatient setting, last seen December 2015.   Anemia - Baseline hemoglobin appears to be between 10.8 and 11.9. - Current hemoglobin 8.3. - Suspect possibly related to hemodilution.    DVT Prophylaxis - Heparin SQ.  Code Status: DNR Family Communication: Daughter, Coralyn Mark and husband, Ed at bedside 11/18/14.  No family at bedside today. Disposition Plan: Home in 1-2 days, if respiratory status stable.  Lives with husband.  Works as a Scientist, water quality at [employer].   IV Access:    Peripheral IV   Procedures and diagnostic studies:   Dg Chest 2 View  11/16/2014: Enlargement of cardiac silhouette with pulmonary vascular congestion.  Chronic bronchitic changes with bibasilar atelectasis.     2D echo 11/17/14: Study Conclusions  - Left ventricle: The cavity size was normal. Wall thickness was normal. Systolic function was normal. The estimated ejection fraction was in the range of 60% to 65%. Wall motion was normal; there were no regional wall motion abnormalities. Doppler parameters are consistent with abnormal left ventricular relaxation (grade 1 diastolic dysfunction). - Left atrium: The atrium was moderately dilated. - Right atrium: The atrium was mildly to moderately dilated.  Medical Consultants:    None.  Anti-Infectives:    Levaquin 11/16/14---> 11/18/14  Subjective:   [name] reports that she has ongoing sore throat, but less sore with chloraseptic spray.  Still has pleuritic pain, pain with coughing.  Still short of breath.  Appetite poor. Reports "stomach" pain with nausea.   Objective:    Filed  Vitals:   11/18/14 1318 11/18/14 1919 11/18/14 2150 11/19/14 0518  BP: 123/41  154/41 154/59  Pulse: 57  70 69  Temp: 97.7 F (36.5 C)  98.2 F (36.8 C) 97.8 F (36.6 C)  TempSrc: Oral  Oral Oral  Resp: 20  20 20   Height:      Weight:    108 kg (238 lb 1.6 oz)  SpO2: 96% 92% 96% 98%    Intake/Output Summary (Last 24 hours) at 11/19/14 0755 Last data filed at 11/19/14 0736  Gross per 24 hour  Intake    660 ml  Output   3150 ml  Net  -2490 ml    Exam: Gen:  NAD, ill-appearing, sitting up in chair but no energy Cardiovascular:  RRR, No M/R/G Respiratory:  Lungs with bibasilar crackles, decreased air movement Gastrointestinal:  Abdomen soft, NT/ND, + BS Extremities:  2+ edema with venous stasis changes/hemosiderin deposits   Data Reviewed:    Labs: Basic Metabolic Panel:  Recent Labs Lab 11/16/14 1051 11/17/14 0338 11/18/14 0350 11/19/14 0400  NA 136 136 133* 139  K 4.0 4.8 5.0 4.6  CL 101 98 96 97  CO2 26 29 30  36*  GLUCOSE 126* 167* 148* 181*  BUN 31* 44* 58* 73*  CREATININE 1.60* 1.80* 1.79* 1.66*  CALCIUM 8.9 8.7 8.7 9.3   GFR Estimated Creatinine Clearance: 35.2 mL/min (by C-G formula based on Cr of 1.66). Liver Function Tests:  Recent Labs Lab 11/17/14 0338  AST 24  ALT 14  ALKPHOS 61  BILITOT 0.3  PROT 7.2  ALBUMIN 3.0*   CBC:  Recent Labs Lab 11/16/14 1051 11/17/14 0338 11/18/14 0350 11/19/14 0400  WBC 11.3* 9.9 18.8* 17.0*  HGB 9.1* 8.3* 8.3* 8.8*  HCT 30.6* 28.2* 28.7* 31.2*  MCV 77.9* 78.1 79.3 80.8  PLT 248 207 224 272   CBG:  Recent Labs Lab 11/18/14 0740 11/18/14 1147 11/18/14 1633 11/18/14 2148 11/19/14 0728  GLUCAP 138* 324* 181* 203* 150*   Hgb A1c:  Recent Labs  11/16/14 1425  HGBA1C 6.3*   Sepsis Labs:  Recent Labs Lab 11/16/14 1051 11/16/14 1057 11/16/14 1717 11/16/14 1930 11/17/14 0338 11/18/14 0350 11/19/14 0400  WBC 11.3*  --   --   --  9.9 18.8* 17.0*  LATICACIDVEN  --  2.65* 5.0* 4.4*   --   --   --    Microbiology Recent Results (from the past 240 hour(s))  Respiratory virus panel     Status: Abnormal   Collection Time: 11/16/14  3:38 PM  Result Value Ref Range Status   Respiratory Syncytial Virus A Negative Negative Final   Respiratory Syncytial Virus B Negative Negative Final   Influenza A Negative Negative Final   Influenza B Negative Negative Final   Parainfluenza 1 Negative Negative Final   Parainfluenza 2 Negative Negative Final   Parainfluenza 3 Negative Negative Final   Metapneumovirus Positive (A) Negative Final   Rhinovirus Negative Negative Final   Adenovirus Negative Negative Final    Comment: (NOTE) Performed At: [facility] [address] [place], [place] [id number] Lindon Romp MD ID:[id number]      Medications:   . allopurinol  100 mg Oral Daily  . antiseptic oral rinse  7 mL Mouth Rinse q12n4p  . aspirin EC  81 mg Oral Daily  . chlorpheniramine-HYDROcodone  5 mL Oral Q12H  .  feeding supplement (ENSURE ENLIVE)  237 mL Oral BID BM  . Fluticasone Furoate-Vilanterol  1 puff Inhalation Daily  . furosemide  40 mg Oral BID  . heparin  5,000 Units Subcutaneous 3 times per day  . insulin aspart  0-5 Units Subcutaneous QHS  . insulin aspart  0-9 Units Subcutaneous TID WC  . ipratropium-albuterol  3 mL Nebulization TID  . losartan  100 mg Oral Daily  . methylPREDNISolone (SOLU-MEDROL) injection  40 mg Intravenous Q12H  . metoprolol  50 mg Oral BID  . sodium chloride  3 mL Intravenous Q12H   Continuous Infusions:   Time spent: 25 minutes.    LOS: 3 days   [facility] Pager [phone]. If unable to reach me by pager, please call my cell phone at [phone].  *Please refer to [email or web address], password [email or web address] to get updated schedule on who will round on this patient, as hospitalists switch teams weekly. If 7PM-7AM, please contact night-coverage at [email or web address], password [email or web address] for any overnight needs.  11/19/2014,  7:55 AM

## 2014-11-19 NOTE — Progress Notes (Signed)
Inpatient Diabetes Program Recommendations  AACE/ADA: New Consensus Statement on Inpatient Glycemic Control (2013)  Target Ranges:  Prepandial:   less than 140 mg/dL      Peak postprandial:   less than 180 mg/dL (1-2 hours)      Critically ill patients:  140 - 180 mg/dL     Results for [name], [name] (MRN [id number]) as of 11/19/2014 08:34  Ref. Range 11/18/2014 07:40 11/18/2014 11:47 11/18/2014 16:33 11/18/2014 21:48  Glucose-Capillary Latest Ref Range: 70-99 mg/dL 138 (H) 324 (H) 181 (H) 203 (H)    Home DM Meds: None  Current DM Orders: Novolog Sensitive SSI tid ac + HS     **Patient currently receiving IV Solumedrol 40 mg Q12 hours.  **Eating 75-100% of meals.  **Having postprandial glucose elevations.    MD- Please consider adding Novolog Meal Coverage while patient getting IV steroids-  Novolog 4 units tidwc     Will follow Wyn Quaker RN, MSN, CDE Diabetes Coordinator Inpatient Diabetes Program Team Pager: [phone] (8a-5p)

## 2014-11-19 NOTE — Progress Notes (Signed)
Physical Therapy Treatment Patient Details Name: [name] MRN: [id number] DOB: 1941/02/10 Today's Date: 11/19/2014    History of Present Illness 74 yo female admitted with acute respiratory failure. Hx of COPD, chronic LE swelling, sleep apnea, HTN, DM, gout, venous stasis dermatitis, CHF    PT Comments    Pt unsteady and SOB with minimal activity requiring O2 [place].  Recommend SNF at this time however pt plans to d/c home.  SATURATION QUALIFICATIONS: (This note is used to comply with regulatory documentation for home oxygen)  Patient Saturations on Room Air at Rest = 86%  Patient Saturations on Room Air while Ambulating = N/A  Patient Saturations on 3 Liters of oxygen while Ambulating = 91%  Please briefly explain why patient needs home oxygen: to maintain oxygen saturations above 88% at rest and during physical activity.   Follow Up Recommendations  SNF;Supervision/Assistance - 24 hour     Equipment Recommendations  None recommended by PT    Recommendations for Other Services       Precautions / Restrictions Precautions Precautions: Fall Precaution Comments: monitor O2 sats Restrictions Weight Bearing Restrictions: No    Mobility  Bed Mobility Overal bed mobility: Needs Assistance Bed Mobility: Supine to Sit     Supine to sit: Supervision;HOB elevated     General bed mobility comments: increased time and effort  Transfers Overall transfer level: Needs assistance Equipment used: Rolling walker (2 wheeled) Transfers: Sit to/from Stand Sit to Stand: Min assist         General transfer comment: assist to rise and steady, verbal cues for safe technique  Ambulation/Gait Ambulation/Gait assistance: Min assist Ambulation Distance (Feet): 30 Feet Assistive device: Rolling walker (2 wheeled) Gait Pattern/deviations: Decreased stride length;Trunk flexed;Step-through pattern     General Gait Details: assist for stability, pt required 3L O2 [place] for SPO2  to improve to 91%, fatigue and SOB with coughing spell after 30 feet requiring recliner brought behind pt   Stairs            Wheelchair Mobility    Modified Rankin (Stroke Patients Only)       Balance                                    Cognition Arousal/Alertness: Awake/alert Behavior During Therapy: WFL for tasks assessed/performed Overall Cognitive Status: Within Functional Limits for tasks assessed                      Exercises      General Comments        Pertinent Vitals/Pain Pain Assessment: 0-10 Pain Score: 5  Pain Location: abdomen Pain Descriptors / Indicators: Aching Pain Intervention(s): Limited activity within patient's tolerance;Monitored during session    Home Living                      Prior Function            PT Goals (current goals can now be found in the care plan section) Progress towards PT goals: Progressing toward goals    Frequency  Min 3X/week    PT Plan Discharge plan needs to be updated    Co-evaluation             End of Session Equipment Utilized During Treatment: Gait belt;Oxygen Activity Tolerance: Patient limited by fatigue Patient left: in chair;with call bell/phone within reach;with chair alarm  set     Time: 2258-3462 PT Time Calculation (min) (ACUTE ONLY): 20 min  Charges:  $Gait Training: 8-22 mins                    G Codes:      [name],[name] 25-Nov-2014, 12:01 PM [name], PT, DPT 11/25/2014 Pager: [phone]

## 2014-11-20 ENCOUNTER — Inpatient Hospital Stay (HOSPITAL_COMMUNITY): Payer: Medicare Other

## 2014-11-20 DIAGNOSIS — I8311 Varicose veins of right lower extremity with inflammation: Secondary | ICD-10-CM

## 2014-11-20 DIAGNOSIS — I8312 Varicose veins of left lower extremity with inflammation: Secondary | ICD-10-CM

## 2014-11-20 LAB — BLOOD GAS, ARTERIAL
ACID-BASE EXCESS: 12.4 mmol/L — AB (ref 0.0–2.0)
Bicarbonate: 39.5 mEq/L — ABNORMAL HIGH (ref 20.0–24.0)
O2 Content: 3 L/min
O2 Saturation: 95.4 %
PCO2 ART: 70.3 mmHg — AB (ref 35.0–45.0)
Patient temperature: 98.6
TCO2: 37.1 mmol/L (ref 0–100)
pH, Arterial: 7.368 (ref 7.350–7.450)
pO2, Arterial: 83.6 mmHg (ref 80.0–100.0)

## 2014-11-20 LAB — CBC
HEMATOCRIT: 31.2 % — AB (ref 36.0–46.0)
HEMOGLOBIN: 8.8 g/dL — AB (ref 12.0–15.0)
MCH: 22.6 pg — ABNORMAL LOW (ref 26.0–34.0)
MCHC: 28.2 g/dL — ABNORMAL LOW (ref 30.0–36.0)
MCV: 80 fL (ref 78.0–100.0)
Platelets: 273 10*3/uL (ref 150–400)
RBC: 3.9 MIL/uL (ref 3.87–5.11)
RDW: 16.8 % — AB (ref 11.5–15.5)
WBC: 15.8 10*3/uL — ABNORMAL HIGH (ref 4.0–10.5)

## 2014-11-20 LAB — BASIC METABOLIC PANEL
Anion gap: 7 (ref 5–15)
BUN: 69 mg/dL — AB (ref 6–23)
CO2: 37 mmol/L — AB (ref 19–32)
CREATININE: 1.36 mg/dL — AB (ref 0.50–1.10)
Calcium: 9.5 mg/dL (ref 8.4–10.5)
Chloride: 96 mmol/L (ref 96–112)
GFR calc Af Amer: 43 mL/min — ABNORMAL LOW (ref >90)
GFR calc non Af Amer: 37 mL/min — ABNORMAL LOW (ref >90)
GLUCOSE: 171 mg/dL — AB (ref 70–99)
Potassium: 5 mmol/L (ref 3.5–5.1)
SODIUM: 140 mmol/L (ref 135–145)

## 2014-11-20 LAB — GLUCOSE, CAPILLARY
GLUCOSE-CAPILLARY: 149 mg/dL — AB (ref 70–99)
GLUCOSE-CAPILLARY: 182 mg/dL — AB (ref 70–99)
Glucose-Capillary: 189 mg/dL — ABNORMAL HIGH (ref 70–99)
Glucose-Capillary: 275 mg/dL — ABNORMAL HIGH (ref 70–99)

## 2014-11-20 LAB — BRAIN NATRIURETIC PEPTIDE: B Natriuretic Peptide: 113.1 pg/mL — ABNORMAL HIGH (ref 0.0–100.0)

## 2014-11-20 LAB — MRSA PCR SCREENING: MRSA by PCR: NEGATIVE

## 2014-11-20 MED ORDER — FUROSEMIDE 10 MG/ML IJ SOLN: 40.0000 mg | Freq: Once | INTRAMUSCULAR | Status: DC

## 2014-11-20 MED ORDER — ALBUTEROL SULFATE (2.5 MG/3ML) 0.083% IN NEBU: 2.5000 mg | INHALATION_SOLUTION | RESPIRATORY_TRACT | Status: DC | PRN

## 2014-11-20 MED ORDER — BISACODYL 10 MG RE SUPP
10.0000 mg | Freq: Once | RECTAL | Status: AC
  Administered 2014-11-20: 10 mg via RECTAL
  Filled 2014-11-20: qty 1

## 2014-11-20 MED ORDER — INSULIN ASPART 100 UNIT/ML ~~LOC~~ SOLN
0.0000 [IU] | Freq: Three times a day (TID) | SUBCUTANEOUS | Status: DC
  Administered 2014-11-20 (×2): 3 [IU] via SUBCUTANEOUS
  Administered 2014-11-20: 2 [IU] via SUBCUTANEOUS
  Administered 2014-11-21 (×2): 3 [IU] via SUBCUTANEOUS
  Administered 2014-11-21: 5 [IU] via SUBCUTANEOUS
  Administered 2014-11-22 (×2): 2 [IU] via SUBCUTANEOUS
  Administered 2014-11-22 – 2014-11-23 (×2): 3 [IU] via SUBCUTANEOUS

## 2014-11-20 MED ORDER — DEXTROMETHORPHAN POLISTIREX ER 30 MG/5ML PO SUER
60.0000 mg | Freq: Two times a day (BID) | ORAL | Status: DC
  Administered 2014-11-20 – 2014-11-23 (×5): 60 mg via ORAL
  Filled 2014-11-20 (×4): qty 10

## 2014-11-20 MED ORDER — DEXTROMETHORPHAN POLISTIREX ER 30 MG/5ML PO SUER
60.0000 mg | Freq: Two times a day (BID) | ORAL | Status: DC
  Administered 2014-11-20: 60 mg via ORAL
  Filled 2014-11-20 (×4): qty 10

## 2014-11-20 MED ORDER — INSULIN ASPART 100 UNIT/ML ~~LOC~~ SOLN
4.0000 [IU] | Freq: Three times a day (TID) | SUBCUTANEOUS | Status: DC
  Administered 2014-11-20 (×2): 4 [IU] via SUBCUTANEOUS

## 2014-11-20 MED ORDER — INSULIN ASPART 100 UNIT/ML ~~LOC~~ SOLN
0.0000 [IU] | Freq: Every day | SUBCUTANEOUS | Status: DC
  Administered 2014-11-20: 3 [IU] via SUBCUTANEOUS

## 2014-11-20 NOTE — Progress Notes (Signed)
Progress Note   [name] FTD:[id number] DOB: [birth year] DOA: 11/16/2014 PCP: [name], MD   Brief Narrative:   [name] is an 74 y.o. female with a PMH of MGUS, chronic nonhealing right lower extremity ulcer/cellulitis, chronic diastolic CHF, hypertension and stage III CKD sent to the ED for/19/16 by her PCP for evaluation of worsening upper respiratory symptoms including shortness of breath, cough, and rhinorrhea.  Assessment/Plan:   Principal Problem:  Acute respiratory failure with hypoxia/COPD exacerbation/viral URI/acute on chronic diastolic CHF - Respiratory virus panel positive for metapneumovirus. Empiric Levaquin discontinued 11/18/14. - Continue Solu-Medrol. Continue bronchodilators. - Continue Pulmonary toileting with incentive spirometry. - Continue supportive care with oxygen. - Last echocardiogram in 2014 showed EF 55-60 %, repeat echo shows EF 60-65 %/grade 1 diastolic dysfunction. - Stop Tussionex as patient seems to have AMS today.  Active Problems:   Altered mental status - Slow to respond, husband at bedside and is concerned.  Seems very weak. - Neuro exam non-focal.  Stop Tussionex and monitor.    Venous stasis ulcer - Status post wound care RN evaluation 0/62/37. - Silicone foam dressing/Unna boots to the right lower extremity ordered. - We'll need home health RN for [facility] changes twice per week and to monitor wound post discharge.   HTN (hypertension) - Currently stable. - Continue Lasix, Cozaar and Lopressor.   CKD (chronic kidney disease), stage III - Current creatinine appears at usual baseline values. - Listed home medication of Anaprox-likely need to reconsider utilization of this medicine in the future.   Bilateral lower extremity edema/venous stasis dermatitis - Subjectively has slight increase in edema, diuresed.   Prediabetes / steroid-induced hyperglycemia - Hemoglobin A1c was 6.03 April 2014 currently  6.3. - CBGs 150-255. Change to moderate scale with 4 units of NovoLog for meal coverage.   OSA (obstructive sleep apnea) - Stable.  Does not use CPAP.  Did not follow up for sleep study, may need to re-schedule when stable.   Hyperlipidemia with target LDL less than 100 - Not on medications prior to admission.   Gout - Currently not expressing symptoms consistent with exacerbation. - Continue preadmission dose of allopurinol.   Monoclonal paraproteinemia - Stable and followed by Dr. Alen Blew in outpatient setting, last seen December 2015.   Anemia - Baseline hemoglobin appears to be between 10.8 and 11.9. - Current hemoglobin 8.3. - Suspect possibly related to hemodilution.    DVT Prophylaxis - Heparin SQ.  Code Status: DNR Family Communication: Husband, Ed updated at bedside. Disposition Plan: Home in 1-2 days, if respiratory status stable and strength improved.  May need to consider SNF for rehab if she remains weak.  Lives with husband.  Works as a Scientist, water quality at [employer].   IV Access:    Peripheral IV   Procedures and diagnostic studies:   Dg Chest 2 View  11/16/2014: Enlargement of cardiac silhouette with pulmonary vascular congestion.  Chronic bronchitic changes with bibasilar atelectasis.     2D echo 11/17/14: Study Conclusions  - Left ventricle: The cavity size was normal. Wall thickness was normal. Systolic function was normal. The estimated ejection fraction was in the range of 60% to 65%. Wall motion was normal; there were no regional wall motion abnormalities. Doppler parameters are consistent with abnormal left ventricular relaxation (grade 1 diastolic dysfunction). - Left atrium: The atrium was moderately dilated. - Right atrium: The atrium was mildly to moderately dilated.   Medical Consultants:    None.  Anti-Infectives:  Levaquin 11/16/14---> 11/18/14  Subjective:   [name] is slow to respond, weak, but color looks better  and she reports that her sore throat is a bit better.  Appetite poor.  Still with some dyspnea.  No BM in several days.   Objective:    Filed Vitals:   11/19/14 2038 11/19/14 2103 11/20/14 0513 11/20/14 0657  BP:  159/44 161/59   Pulse:  69 61   Temp:  98.4 F (36.9 C) 98 F (36.7 C)   TempSrc:  Oral Oral   Resp:  20 18   Height:      Weight:    106.459 kg (234 lb 11.2 oz)  SpO2: 95% 94% 96%     Intake/Output Summary (Last 24 hours) at 11/20/14 0726 Last data filed at 11/20/14 2505  Gross per 24 hour  Intake    960 ml  Output   3850 ml  Net  -2890 ml    Exam: Gen:  NAD, ill-appearing, sitting up in chair but no energy Cardiovascular:  RRR, No M/R/G Respiratory:  Lungs with bibasilar crackles, decreased air movement Gastrointestinal:  Abdomen soft, NT/ND, + BS Extremities:  2+ edema with venous stasis changes/hemosiderin deposits   Data Reviewed:    Labs: Basic Metabolic Panel:  Recent Labs Lab 11/16/14 1051 11/17/14 0338 11/18/14 0350 11/19/14 0400 11/20/14 0500  NA 136 136 133* 139 140  K 4.0 4.8 5.0 4.6 5.0  CL 101 98 96 97 96  CO2 26 29 30  36* 37*  GLUCOSE 126* 167* 148* 181* 171*  BUN 31* 44* 58* 73* 69*  CREATININE 1.60* 1.80* 1.79* 1.66* 1.36*  CALCIUM 8.9 8.7 8.7 9.3 9.5   GFR Estimated Creatinine Clearance: 42.6 mL/min (by C-G formula based on Cr of 1.36). Liver Function Tests:  Recent Labs Lab 11/17/14 0338  AST 24  ALT 14  ALKPHOS 61  BILITOT 0.3  PROT 7.2  ALBUMIN 3.0*   CBC:  Recent Labs Lab 11/16/14 1051 11/17/14 0338 11/18/14 0350 11/19/14 0400 11/20/14 0500  WBC 11.3* 9.9 18.8* 17.0* 15.8*  HGB 9.1* 8.3* 8.3* 8.8* 8.8*  HCT 30.6* 28.2* 28.7* 31.2* 31.2*  MCV 77.9* 78.1 79.3 80.8 80.0  PLT 248 207 224 272 273   CBG:  Recent Labs Lab 11/18/14 2148 11/19/14 0728 11/19/14 1208 11/19/14 1642 11/19/14 2101  GLUCAP 203* 150* 227* 255* 170*   Hgb A1c: No results for input(s): HGBA1C in the last 72 hours. Sepsis  Labs:  Recent Labs Lab 11/16/14 1057 11/16/14 1717 11/16/14 1930 11/17/14 0338 11/18/14 0350 11/19/14 0400 11/20/14 0500  WBC  --   --   --  9.9 18.8* 17.0* 15.8*  LATICACIDVEN 2.65* 5.0* 4.4*  --   --   --   --    Microbiology Recent Results (from the past 240 hour(s))  Respiratory virus panel     Status: Abnormal   Collection Time: 11/16/14  3:38 PM  Result Value Ref Range Status   Respiratory Syncytial Virus A Negative Negative Final   Respiratory Syncytial Virus B Negative Negative Final   Influenza A Negative Negative Final   Influenza B Negative Negative Final   Parainfluenza 1 Negative Negative Final   Parainfluenza 2 Negative Negative Final   Parainfluenza 3 Negative Negative Final   Metapneumovirus Positive (A) Negative Final   Rhinovirus Negative Negative Final   Adenovirus Negative Negative Final    Comment: (NOTE) Performed At: [facility] [place], [place] [id number] Lindon Romp MD  DX:[id number]      Medications:   . allopurinol  100 mg Oral Daily  . antiseptic oral rinse  7 mL Mouth Rinse q12n4p  . aspirin EC  81 mg Oral Daily  . chlorpheniramine-HYDROcodone  5 mL Oral Q12H  . feeding supplement (ENSURE ENLIVE)  237 mL Oral BID BM  . Fluticasone Furoate-Vilanterol  1 puff Inhalation Daily  . furosemide  40 mg Oral BID  . heparin  5,000 Units Subcutaneous 3 times per day  . insulin aspart  0-5 Units Subcutaneous QHS  . insulin aspart  0-9 Units Subcutaneous TID WC  . ipratropium-albuterol  3 mL Nebulization QID  . losartan  100 mg Oral Daily  . methylPREDNISolone (SOLU-MEDROL) injection  60 mg Intravenous Q12H  . metoprolol  50 mg Oral BID  . sodium chloride  3 mL Intravenous Q12H   Continuous Infusions:   Time spent: 25 minutes.    LOS: 4 days   [facility] Pager [phone]. If unable to reach me by pager, please call my cell phone at [phone].  *Please refer to [email or web address], password [email or web address] to  get updated schedule on who will round on this patient, as hospitalists switch teams weekly. If 7PM-7AM, please contact night-coverage at [email or web address], password [email or web address] for any overnight needs.  11/20/2014, 7:26 AM

## 2014-11-21 DIAGNOSIS — I87311 Chronic venous hypertension (idiopathic) with ulcer of right lower extremity: Secondary | ICD-10-CM

## 2014-11-21 LAB — CBC
HCT: 31.7 % — ABNORMAL LOW (ref 36.0–46.0)
Hemoglobin: 9.1 g/dL — ABNORMAL LOW (ref 12.0–15.0)
MCH: 22.8 pg — ABNORMAL LOW (ref 26.0–34.0)
MCHC: 28.7 g/dL — ABNORMAL LOW (ref 30.0–36.0)
MCV: 79.3 fL (ref 78.0–100.0)
Platelets: 246 10*3/uL (ref 150–400)
RBC: 4 MIL/uL (ref 3.87–5.11)
RDW: 16.5 % — AB (ref 11.5–15.5)
WBC: 11 10*3/uL — AB (ref 4.0–10.5)

## 2014-11-21 LAB — GLUCOSE, CAPILLARY
GLUCOSE-CAPILLARY: 215 mg/dL — AB (ref 70–99)
Glucose-Capillary: 157 mg/dL — ABNORMAL HIGH (ref 70–99)
Glucose-Capillary: 177 mg/dL — ABNORMAL HIGH (ref 70–99)

## 2014-11-21 LAB — BASIC METABOLIC PANEL
ANION GAP: 8 (ref 5–15)
BUN: 68 mg/dL — ABNORMAL HIGH (ref 6–23)
CHLORIDE: 92 mmol/L — AB (ref 96–112)
CO2: 43 mmol/L (ref 19–32)
Calcium: 10 mg/dL (ref 8.4–10.5)
Creatinine, Ser: 1.31 mg/dL — ABNORMAL HIGH (ref 0.50–1.10)
GFR calc Af Amer: 45 mL/min — ABNORMAL LOW (ref >90)
GFR calc non Af Amer: 39 mL/min — ABNORMAL LOW (ref >90)
Glucose, Bld: 165 mg/dL — ABNORMAL HIGH (ref 70–99)
POTASSIUM: 4.8 mmol/L (ref 3.5–5.1)
Sodium: 143 mmol/L (ref 135–145)

## 2014-11-21 MED ORDER — INSULIN ASPART 100 UNIT/ML ~~LOC~~ SOLN
5.0000 [IU] | Freq: Three times a day (TID) | SUBCUTANEOUS | Status: DC
  Administered 2014-11-21 – 2014-11-23 (×7): 5 [IU] via SUBCUTANEOUS

## 2014-11-21 MED ORDER — METHYLPREDNISOLONE SODIUM SUCC 40 MG IJ SOLR
40.0000 mg | Freq: Two times a day (BID) | INTRAMUSCULAR | Status: DC
  Administered 2014-11-21 – 2014-11-22 (×2): 40 mg via INTRAVENOUS
  Filled 2014-11-21 (×4): qty 1

## 2014-11-21 MED ORDER — HYDRALAZINE HCL 20 MG/ML IJ SOLN
10.0000 mg | Freq: Four times a day (QID) | INTRAMUSCULAR | Status: DC | PRN
  Administered 2014-11-21 (×3): 10 mg via INTRAVENOUS
  Filled 2014-11-21 (×3): qty 1

## 2014-11-21 MED ORDER — METOPROLOL TARTRATE 1 MG/ML IV SOLN
5.0000 mg | Freq: Once | INTRAVENOUS | Status: AC
  Administered 2014-11-21: 5 mg via INTRAVENOUS
  Filled 2014-11-21: qty 5

## 2014-11-21 NOTE — Progress Notes (Signed)
Progress Note   [name] QMG:[id number] DOB: [birth year] DOA: 11/16/2014 PCP: [name], MD   Brief Narrative:   [name] is an 74 y.o. female with a PMH of MGUS, chronic nonhealing right lower extremity ulcer/cellulitis, chronic diastolic CHF, hypertension and stage III CKD sent to the ED for/19/16 by her PCP for evaluation of worsening upper respiratory symptoms including shortness of breath, cough, and rhinorrhea.  The patient was moved to the SDU 11/20/14 due to AMS and hypercarbic respiratory failure necessitating Bipap.  Assessment/Plan:   Principal Problem:  Acute respiratory failure with hypoxia and hypercarbia/COPD exacerbation/viral URI/acute on chronic diastolic CHF - Respiratory virus panel positive for metapneumovirus. Empiric Levaquin discontinued 11/18/14. - Continue Solu-Medrol. Continue bronchodilators. - Continue Pulmonary toileting with incentive spirometry. - Continue supportive care with oxygen. - Last echocardiogram in 2014 showed EF 55-60 %, repeat echo shows EF 60-65 %/grade 1 diastolic dysfunction. - On 11/20/14, the patient developed worsening mental status. ABG showed hypercarbia. Moved to the SDU & bipap initiated. - Bipap was weaned last evening, now on N/C.  Tx back to floor.  Active Problems:   Altered mental status - Likely secondary to hypercarbic respiratory failure. Tussionex discontinued 11/20/14.  Avoid sedating meds.    Venous stasis ulcer - Status post wound care RN evaluation 10/22/69. - Silicone foam dressing/Unna boots to the right lower extremity ordered. - We'll need home health RN for [facility] changes twice per week and to monitor wound post discharge.   HTN (hypertension) - Systolic blood pressure is 189-209 over the past 24 hours. Add hydralazine as needed. - Continue Lasix, Cozaar and Lopressor.   CKD (chronic kidney disease), stage III - Current creatinine appears at usual baseline values. - Listed home  medication of Anaprox-likely need to reconsider utilization of this medicine in the future.   Bilateral lower extremity edema/venous stasis dermatitis - On Lasix 40 mg twice a day.   Prediabetes / steroid-induced hyperglycemia - Hemoglobin A1c was 6.03 April 2014 currently 6.3. - CBGs 149-275. Currently on moderate scale SSI with 4 units of NovoLog for meal coverage. Increase meal coverage to 5 units.   OSA (obstructive sleep apnea) - Stable.  Does not use CPAP.  Did not follow up for sleep study, may need to re-schedule when stable.   Hyperlipidemia with target LDL less than 100 - Not on medications prior to admission.   Gout - Currently not expressing symptoms consistent with exacerbation. - Continue preadmission dose of allopurinol.   Monoclonal paraproteinemia - Stable and followed by Dr. Alen Blew in outpatient setting, last seen December 2015.   Anemia - Baseline hemoglobin appears to be between 10.8 and 11.9. - Current hemoglobin 8.3. - Suspect possibly related to hemodilution.    DVT Prophylaxis - Heparin SQ.  Code Status: DNR Family Communication: Husband, Ed updated at bedside 11/20/14. Disposition Plan: Home in 2-3 days, if respiratory status stable and strength improved.  May need to consider SNF for rehab if she remains weak.     IV Access:    Peripheral IV   Procedures and diagnostic studies:   Dg Chest 2 View  11/16/2014: Enlargement of cardiac silhouette with pulmonary vascular congestion.  Chronic bronchitic changes with bibasilar atelectasis.     2D echo 11/17/14: Study Conclusions  - Left ventricle: The cavity size was normal. Wall thickness was normal. Systolic function was normal. The estimated ejection fraction was in the range of 60% to 65%. Wall motion was normal; there were no  regional wall motion abnormalities. Doppler parameters are consistent with abnormal left ventricular relaxation (grade 1 diastolic dysfunction). -  Left atrium: The atrium was moderately dilated. - Right atrium: The atrium was mildly to moderately dilated.  Dg Chest Port 1 View 11/20/2014: No definite evidence of lobar pneumonia or edema, though the lung bases are not well evaluated. If there is ongoing concern, a formal PA and lateral chest x-ray may be useful.  Atherosclerosis.     Medical Consultants:    None.  Anti-Infectives:    Levaquin 11/16/14---> 11/18/14  Subjective:   [name] is awake and alert.  Says her cough is better.  Ate a little breakfast.  Less dyspneic.  Bowels finally moved.   Objective:    Filed Vitals:   11/21/14 0300 11/21/14 0400 11/21/14 0500 11/21/14 0600  BP: 189/57 204/83 192/86 209/77  Pulse: 68 65 65 60  Temp:      TempSrc:      Resp: 20 17 17 17   Height:      Weight:   106 kg (233 lb 11 oz)   SpO2: 95% 95% 96% 96%    Intake/Output Summary (Last 24 hours) at 11/21/14 0714 Last data filed at 11/20/14 2200  Gross per 24 hour  Intake    440 ml  Output   1001 ml  Net   -561 ml    Exam: Gen:  NAD, awake/alert Cardiovascular:  RRR, No M/R/G Respiratory:  Lungs decreased, no wheeze Gastrointestinal:  Abdomen soft, NT/ND, + BS Extremities:  2+ edema with venous stasis changes/hemosiderin deposits   Data Reviewed:    Labs: Basic Metabolic Panel:  Recent Labs Lab 11/17/14 0338 11/18/14 0350 11/19/14 0400 11/20/14 0500 11/21/14 0403  NA 136 133* 139 140 143  K 4.8 5.0 4.6 5.0 4.8  CL 98 96 97 96 92*  CO2 29 30 36* 37* 43*  GLUCOSE 167* 148* 181* 171* 165*  BUN 44* 58* 73* 69* 68*  CREATININE 1.80* 1.79* 1.66* 1.36* 1.31*  CALCIUM 8.7 8.7 9.3 9.5 10.0   GFR Estimated Creatinine Clearance: 44.1 mL/min (by C-G formula based on Cr of 1.31). Liver Function Tests:  Recent Labs Lab 11/17/14 0338  AST 24  ALT 14  ALKPHOS 61  BILITOT 0.3  PROT 7.2  ALBUMIN 3.0*   CBC:  Recent Labs Lab 11/17/14 0338 11/18/14 0350 11/19/14 0400 11/20/14 0500  11/21/14 0403  WBC 9.9 18.8* 17.0* 15.8* 11.0*  HGB 8.3* 8.3* 8.8* 8.8* 9.1*  HCT 28.2* 28.7* 31.2* 31.2* 31.7*  MCV 78.1 79.3 80.8 80.0 79.3  PLT 207 224 272 273 246   CBG:  Recent Labs Lab 11/19/14 2101 11/20/14 0744 11/20/14 1152 11/20/14 1657 11/20/14 2144  GLUCAP 170* 149* 182* 189* 275*   ABG    Component Value Date/Time   PHART 7.368 11/20/2014 1453   PCO2ART 70.3* 11/20/2014 1453   PO2ART 83.6 11/20/2014 1453   HCO3 39.5* 11/20/2014 1453   TCO2 37.1 11/20/2014 1453   O2SAT 95.4 11/20/2014 1453    Sepsis Labs:  Recent Labs Lab 11/16/14 1057 11/16/14 1717 11/16/14 1930  11/18/14 0350 11/19/14 0400 11/20/14 0500 11/21/14 0403  WBC  --   --   --   < > 18.8* 17.0* 15.8* 11.0*  LATICACIDVEN 2.65* 5.0* 4.4*  --   --   --   --   --   < > = values in this interval not displayed. Microbiology Recent Results (from the past 240 hour(s))  Respiratory virus panel  Status: Abnormal   Collection Time: 11/16/14  3:38 PM  Result Value Ref Range Status   Respiratory Syncytial Virus A Negative Negative Final   Respiratory Syncytial Virus B Negative Negative Final   Influenza A Negative Negative Final   Influenza B Negative Negative Final   Parainfluenza 1 Negative Negative Final   Parainfluenza 2 Negative Negative Final   Parainfluenza 3 Negative Negative Final   Metapneumovirus Positive (A) Negative Final   Rhinovirus Negative Negative Final   Adenovirus Negative Negative Final    Comment: (NOTE) Performed At: [facility] [place], [place] [id number] Lindon Romp MD [id number]   MRSA PCR Screening     Status: None   Collection Time: 11/20/14  3:35 PM  Result Value Ref Range Status   MRSA by PCR NEGATIVE NEGATIVE Final    Comment:        The GeneXpert MRSA Assay (FDA approved for NASAL specimens only), is one component of a comprehensive MRSA colonization surveillance program. It is not intended to diagnose MRSA infection  nor to guide or monitor treatment for MRSA infections.      Medications:   . allopurinol  100 mg Oral Daily  . antiseptic oral rinse  7 mL Mouth Rinse q12n4p  . aspirin EC  81 mg Oral Daily  . dextromethorphan  60 mg Oral BID  . feeding supplement (ENSURE ENLIVE)  237 mL Oral BID BM  . Fluticasone Furoate-Vilanterol  1 puff Inhalation Daily  . furosemide  40 mg Oral BID  . heparin  5,000 Units Subcutaneous 3 times per day  . insulin aspart  0-15 Units Subcutaneous TID WC  . insulin aspart  0-5 Units Subcutaneous QHS  . insulin aspart  4 Units Subcutaneous TID WC  . ipratropium-albuterol  3 mL Nebulization QID  . losartan  100 mg Oral Daily  . methylPREDNISolone (SOLU-MEDROL) injection  60 mg Intravenous Q12H  . metoprolol  50 mg Oral BID  . sodium chloride  3 mL Intravenous Q12H   Continuous Infusions:   Time spent: 25 minutes.    LOS: 5 days   [facility] Pager [phone]. If unable to reach me by pager, please call my cell phone at [phone].  *Please refer to [email or web address], password [email or web address] to get updated schedule on who will round on this patient, as hospitalists switch teams weekly. If 7PM-7AM, please contact night-coverage at [email or web address], password [email or web address] for any overnight needs.  11/21/2014, 7:14 AM

## 2014-11-22 DIAGNOSIS — I2781 Cor pulmonale (chronic): Secondary | ICD-10-CM

## 2014-11-22 LAB — GLUCOSE, CAPILLARY
GLUCOSE-CAPILLARY: 136 mg/dL — AB (ref 70–99)
GLUCOSE-CAPILLARY: 135 mg/dL — AB (ref 70–99)
Glucose-Capillary: 195 mg/dL — ABNORMAL HIGH (ref 70–99)
Glucose-Capillary: 139 mg/dL — ABNORMAL HIGH (ref 70–99)
Glucose-Capillary: 125 mg/dL — ABNORMAL HIGH (ref 70–99)

## 2014-11-22 MED ORDER — PREDNISONE 50 MG PO TABS
60.0000 mg | ORAL_TABLET | Freq: Every day | ORAL | Status: DC
  Administered 2014-11-23: 60 mg via ORAL
  Filled 2014-11-22 (×2): qty 1

## 2014-11-22 NOTE — Progress Notes (Signed)
SATURATION QUALIFICATIONS: (This note is used to comply with regulatory documentation for home oxygen)  Patient Saturations on Room Air at Rest = 89%  Patient Saturations on Room Air while Ambulating = 86%  Patient Saturations on 2 Liters of oxygen while Ambulating = 96%  Please briefly explain why patient needs home oxygen: O2 sats <89% ambulating w/out O2 [name], Barbee Shropshire, RN

## 2014-11-22 NOTE — Clinical Social Work Note (Signed)
Clinical Social Work Assessment  Patient Details  Name: [name] MRN: [id number] Date of Birth: 1940-08-02  Date of referral:  11/22/14               Reason for consult:  Facility Placement                Permission sought to share information with:  Facility Art therapist granted to share information::  No  Name::     Brittanee Ghazarian  Agency::     Relationship::  Husband  Contact Information:  [phone]  Housing/Transportation Living arrangements for the past 2 months:  Single Family Home Source of Information:  Patient, Adult Children Patient Interpreter Needed:  None Criminal Activity/Legal Involvement Pertinent to Current Situation/Hospitalization:  No - Comment as needed Significant Relationships:  Adult Children, Significant Other Lives with:  Significant Other Do you feel safe going back to the place where you live?  Yes Need for family participation in patient care:  Yes (Comment) (Pt was lucid with family in the room; however, Pt has had episodes of confustion so family contact and involvement in treatment may be necessary)  Care giving concerns:  Level of support is adequate; however, daughter reports that Pt husband is not very involved at home.   Social Worker assessment / plan:  CSW received consult for Pt due to PT's recommendation for SNF. CSW introduced self and explained role. CSW discussed with family PT's recommendation for SNF and that CSW would facilitate that process if Pt is agreeable. Pt reported that she is not interested in SNF and would like home health services as she has had in the past. Pt verbalized that home health was provided by [facility] and she felt that their team did an excellent job. 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   Employment  status:  Contractor PT Recommendations:  [place] / Referral to community resources:   (None provided; Pt declined SNF placement)  Patient/Family's Response to care:  Pt declines SNF placement; verbalizes her desire for home health services rather than facility placement.   Patient/Family's Understanding of and Emotional Response to Diagnosis, Current Treatment, and Prognosis:  Pt and family were pleasant and cooperative during assessment. Pt verbalized that she is eager to get home and get back to work. She also expresses that she had a good experience with home health and would like to try that again.   Emotional Assessment Appearance:  Appears stated age, Disheveled Attitude/Demeanor/Rapport:   (Cooperative) Affect (typically observed):  Adaptable, Calm, Stable Orientation:  Oriented to  Time, Oriented to Situation, Oriented to Place, Oriented to Self Alcohol / Substance use:  Not Applicable Psych involvement (Current and /or in the community):  No (Comment)  Discharge Needs  Concerns to be addressed:  Denies Needs/Concerns at this time Readmission within the last 30 days:  No Current discharge risk:  None Barriers to Discharge:  Continued Medical Work up   Bo Mcclintock, LCSW 11/22/2014, 11:29 AM

## 2014-11-22 NOTE — Progress Notes (Signed)
NUTRITION FOLLOW-UP  DOCUMENTATION CODES Per approved criteria  -Morbid Obesity   INTERVENTION: - Continue current diet - Continue Ensure Enlive po BID, each supplement provides 350 kcal and 20 grams of protein - RD provided CHF diet education   NUTRITION DIAGNOSIS: Difficulty swallowing related to difficulty breathing during meal times as evidenced by pt report, hx of COPD. -ongoing  Goal: Pt to meet >/= 90% estimated needs -unmet on average  Monitor:  Intakes of meals and supplement, weight trends, labs, I/O's   74 y.o. female  Admitting Dx: Acute bronchiolitis due to human metapneumovirus  ASSESSMENT: Pt is a 74 year old who was admitted for acute resp failure with hypoxia in the setting of hx of COPD. She also has hx of DM and cancer. Pt with chronic RLE venous stasis ulcer x2 years. Pt reports hx of umbilical hernia and ulcerative colitis.  RD consulted for nutrition education regarding new onset CHF.  RD provided "Low Sodium Nutrition Therapy" handout from the [employer] of [employer]. Reviewed patient's dietary recall. Provided examples on ways to decrease sodium intake in diet. Encouraged fresh fruits and vegetables as well as whole grain sources of carbohydrates to maximize fiber intake. Pt reports that she has had this eduction several times in the past and is able to give examples of high sodium-containing foods. She states that she has made changes to her diet and does not add salt to foods before or after cooking anymore.  RD discussed why it is important for patient to adhere to diet recommendations, and emphasized the role of fluids, foods to avoid, and importance of weighing self daily. Pt had a good understanding prior to education why limiting salt in her diet is important.Teach back method used. Expect good compliance.  Pt also seen for follow-up at this time. Pt eating 25-100%, variable. She reports generally not feeling well so appetite has remained  poor overall. For breakfast she states she had a muffin and oatmeal and for lunch she ate a half of a sandwich.  Pt has been drinking Ensure at times, but prefers Atkins protein supplement shake which is not available here.  Variably meeting needs. Labs and medications reviewed; CBGs: 135-275 mg/dL for pt on steroids, Cl: 95 mmol/L, BUN/creatinine elevated, GFR: 39.  Height: Ht Readings from Last 1 Encounters:  11/21/14 5' (1.524 m)    Weight: Wt Readings from Last 1 Encounters:  11/21/14 233 lb 11 oz (106 kg)    Ideal Body Weight: 115 lbs (52.27 kg)  % Ideal Body Weight: 204%  Wt Readings from Last 10 Encounters:  11/21/14 233 lb 11 oz (106 kg)  11/16/14 235 lb (106.595 kg)  07/07/14 244 lb 4.8 oz (110.814 kg)  04/22/14 244 lb (110.678 kg)  04/14/14 243 lb (110.224 kg)  11/25/13 240 lb 8 oz (109.09 kg)  10/14/13 239 lb 8 oz (108.636 kg)  08/31/13 236 lb (107.049 kg)  06/22/13 232 lb 12 oz (105.575 kg)  06/05/13 237 lb 10.5 oz (107.8 kg)    BMI:  Body mass index is 45.64 kg/(m^2).  Estimated Nutritional Needs: Kcal: 1600-1800  Protein: 100-120 grams Fluid: <1.5L/day  Skin: chronic RLE venous stasis ulcer, ecchymosis to BLE  Diet Order: Diet Carb Modified Fluid consistency:: Thin; Room service appropriate?: Yes  EDUCATION NEEDS: -Provided CHF education   Intake/Output Summary (Last 24 hours) at 11/22/14 1437 Last data filed at 11/22/14 1300  Gross per 24 hour  Intake    820 ml  Output  0 ml  Net    820 ml    Last BM: 4/22  Labs:   Recent Labs Lab 11/19/14 0400 11/20/14 0500 11/21/14 0403  NA 139 140 143  K 4.6 5.0 4.8  CL 97 96 92*  CO2 36* 37* 43*  BUN 73* 69* 68*  CREATININE 1.66* 1.36* 1.31*  CALCIUM 9.3 9.5 10.0  GLUCOSE 181* 171* 165*    CBG (last 3)   Recent Labs  11/21/14 2104 11/22/14 0733 11/22/14 1142  GLUCAP 136* 135* 195*    Scheduled Meds: . allopurinol  100 mg Oral Daily  . antiseptic oral rinse  7 mL Mouth Rinse  q12n4p  . aspirin EC  81 mg Oral Daily  . dextromethorphan  60 mg Oral BID  . feeding supplement (ENSURE ENLIVE)  237 mL Oral BID BM  . Fluticasone Furoate-Vilanterol  1 puff Inhalation Daily  . furosemide  40 mg Oral BID  . heparin  5,000 Units Subcutaneous 3 times per day  . insulin aspart  0-15 Units Subcutaneous TID WC  . insulin aspart  0-5 Units Subcutaneous QHS  . insulin aspart  5 Units Subcutaneous TID WC  . ipratropium-albuterol  3 mL Nebulization QID  . losartan  100 mg Oral Daily  . metoprolol  50 mg Oral BID  . [START ON 11/23/2014] predniSONE  60 mg Oral QAC breakfast  . sodium chloride  3 mL Intravenous Q12H    Continuous Infusions:   Past Medical History  Diagnosis Date  . HTN (hypertension) 07/30/1988  . GERD (gastroesophageal reflux disease) 07/30/92  . COPD (chronic obstructive pulmonary disease) 01/27/98  . Hyperlipidemia 11/27/96  . CHF (congestive heart failure) 08/31/03  . Gout   . Renal insufficiency   . Kidney stone 1960, 1972, 1991  . Ulcerative colitis     Past Surgical History  Procedure Laterality Date  . No past surgeries      Jarome Matin, RD, LDN Inpatient Clinical Dietitian Pager # [phone] After hours/weekend pager # [phone]

## 2014-11-22 NOTE — Progress Notes (Signed)
CARE MANAGEMENT NOTE 11/22/2014  Patient:  [name], [name]   Account Number:  [id number]  Date Initiated:  11/17/2014  Documentation initiated by:  Dessa Phi  Subjective/Objective Assessment:   74 y/o f admitted w/Acute resp failure w/hypoxia.JQ:BHAL, RLE ulcer, Lung ca,CHF.     Action/Plan:   From home w/spouse.   Anticipated DC Date:  11/22/2014   Anticipated DC Plan:  [place]  CM consult      [facility] Choice  HOME HEALTH   Choice offered to / List presented to:  C-1 Patient        [place] arranged  [place]      [place].   Status of service:  In process, will continue to follow Medicare Important Message given?   (If response is "NO", the following Medicare IM given date fields will be blank) Date Medicare IM given:   Medicare IM given by:   Date Additional Medicare IM given:   Additional Medicare IM given by:    Discharge Disposition:  [place]  Per UR Regulation:  Reviewed for med. necessity/level of care/duration of stay  If discussed at [place] of Stay Meetings, dates discussed:    Comments:  11/22/14 Leanne Chang RN BSn CM [phone] Patient declined SNF per PT recommendation. She lives at home with her spouse and states she will retrun home. She has had [place] services in the past with [facility] and would like [facility] to continue to provided [facility] services. She has a rolling walker and shower chair at home. Patient remains on O2 at 2L will continue to follow for [facility] orders and if need for home O2.  11/18/14 Dessa Phi RN BSN NCM [phone] [facility] aware of HHRN/PT order.Await d/c order.  11/17/14 Dessa Phi RN BSN NCM [phone] PT-HH.[facility] chosen for [facility].TC Kristen rep aware of HHPT order,await d/c order.WOC cons-RLE wounds. On 02 will monitor.Monitor progress for d/c needs.

## 2014-11-22 NOTE — Progress Notes (Signed)
Occupational Therapy Treatment Patient Details Name: [name] MRN: [id number] DOB: 1940/12/30 Today's Date: 11/22/2014    History of present illness 74 yo female admitted with acute respiratory failure. Hx of COPD, chronic LE swelling, sleep apnea, HTN, DM, gout, venous stasis dermatitis, CHF   OT comments  Pt up to [facility] and voided then worked on bathing task sit to stand level from chair. Pt educated on AE for LB self care. Will follow. Recommend SNF but if pt decides to d/c home, she will need 24/7 care initially.    Follow Up Recommendations  SNF;Supervision/Assistance - 24 hour    Equipment Recommendations  3 in 1 bedside comode    Recommendations for Other Services      Precautions / Restrictions Precautions Precautions: Fall Precaution Comments: monitor O2 sats Restrictions Weight Bearing Restrictions: No       Mobility Bed Mobility               General bed mobility comments: in chair  Transfers Overall transfer level: Needs assistance Equipment used: Rolling walker (2 wheeled) Transfers: Sit to/from Stand Sit to Stand: Min assist         General transfer comment: verbal cues hand placement.    Balance                                   ADL       Grooming: Wash/dry face;Set up;Sitting       Lower Body Bathing: Moderate assistance;Sit to/from stand           Toilet Transfer: Minimal assistance;Stand-pivot;BSC;RW   Toileting- Clothing Manipulation and Hygiene: Moderate assistance;Sit to/from stand         General ADL Comments: Educated on AE options for LB self care including LHS as pt has difficulty reaching all the way down to her feet to wash. She also cant reach to wash posterior periarea so educated on LHS use with bending handle to wash bottom. Demonstrated other AE pieces including long shoe horn, reacher and sock aid. Pt states she may wait on AE but is glad to know options. Pivoted to [facility] to void due to  urgency to use commode and pt needs cues for hand placement and safe walker use. Nursing in to unwrap R LE and stated ok to wash R LE. O2 sats on 2L O2 97% with actiivty.       Vision                     Perception     Praxis      Cognition   Behavior During Therapy: WFL for tasks assessed/performed Overall Cognitive Status: Within Functional Limits for tasks assessed                       Extremity/Trunk Assessment               Exercises     Shoulder Instructions       General Comments      Pertinent Vitals/ Pain       Pain Assessment: No/denies pain  Home Living                                          Prior Functioning/Environment  Frequency Min 2X/week     Progress Toward Goals  OT Goals(current goals can now be found in the care plan section)  Progress towards OT goals: Progressing toward goals     Plan Discharge plan remains appropriate    Co-evaluation                 End of Session Equipment Utilized During Treatment: Rolling walker;Oxygen   Activity Tolerance Patient tolerated treatment well   Patient Left in chair;with call bell/phone within reach   Nurse Communication          Time: 1140-1210 OT Time Calculation (min): 30 min  Charges: OT General Charges $OT Visit: 1 Procedure OT Treatments $Self Care/Home Management : 8-22 mins $Therapeutic Activity: 8-22 mins  Jules Schick  [phone] 11/22/2014, 12:24 PM

## 2014-11-22 NOTE — Progress Notes (Signed)
Progress Note   [name] IRS:[id number] DOB: July 21, 1941 DOA: 11/16/2014 PCP: [name], MD   Brief Narrative:   [name] is an 74 y.o. female with a PMH of MGUS, chronic nonhealing right lower extremity ulcer/cellulitis, chronic diastolic CHF, hypertension and stage III CKD sent to the ED for/19/16 by her PCP for evaluation of worsening upper respiratory symptoms including shortness of breath, cough, and rhinorrhea.  The patient was moved to the SDU 11/20/14 due to AMS and hypercarbic respiratory failure necessitating Bipap.  Assessment/Plan:   Principal Problem:  Acute respiratory failure with hypoxia and hypercarbia/COPD exacerbation/viral URI/acute on chronic diastolic CHF - Respiratory virus panel positive for metapneumovirus. Empiric Levaquin discontinued 11/18/14. - Wean Solumedrol to prednisone starting 11/23/14. Continue bronchodilators. - Continue Pulmonary toileting with incentive spirometry. - Continue supportive care with oxygen. - Last echocardiogram in 2014 showed EF 55-60 %, repeat echo shows EF 60-65 %/grade 1 diastolic dysfunction. - On 11/20/14, the patient developed worsening mental status. ABG showed hypercarbia. Moved to the SDU & bipap initiated. - Bipap was weaned the evening of 11/20/14, remains on N/C.   Active Problems:   Altered mental status - Likely secondary to hypercarbic respiratory failure. Tussionex discontinued 11/20/14.  Avoid sedating meds. - Improved.    Venous stasis ulcer - Status post wound care RN evaluation 0/09/38. - Silicone foam dressing/Unna boots to the right lower extremity ordered. - We'll need home health RN for [employer] changes twice per week and to monitor wound post discharge.   HTN (hypertension) - Systolic blood pressure is 164-181 over the past 24 hours. Continue hydralazine as needed. - Continue Lasix, Cozaar and Lopressor.   CKD (chronic kidney disease), stage III - Current creatinine appears at usual  baseline values. - Listed home medication of Anaprox-likely need to reconsider utilization of this medicine in the future.   Bilateral lower extremity edema/venous stasis dermatitis - On Lasix 40 mg twice a day.   Prediabetes / steroid-induced hyperglycemia - Hemoglobin A1c was 6.03 April 2014 currently 6.3.  Dietician consult for diet education. - CBGs 136-275. Currently on moderate scale SSI with 5 units of NovoLog for meal coverage. - Change diet to CM.   OSA (obstructive sleep apnea) - Stable.  Does not use CPAP.  Did not follow up for sleep study, may need to re-schedule when stable.   Hyperlipidemia with target LDL less than 100 - Not on medications prior to admission.   Gout - Currently not expressing symptoms consistent with exacerbation. - Continue preadmission dose of allopurinol.   Monoclonal paraproteinemia - Stable and followed by Dr. Alen Blew in outpatient setting, last seen December 2015.   Anemia - Baseline hemoglobin appears to be between 10.8 and 11.9. - Current hemoglobin stable but lower than usual baseline values. - Suspect possibly related to hemodilution.    DVT Prophylaxis - Heparin SQ.  Code Status: DNR Family Communication: Husband, Ed updated at bedside 11/20/14. Disposition Plan: Home in 1-2 days, if respiratory status stable and strength improved.  May need to consider SNF for rehab if she remains weak.     IV Access:    Peripheral IV   Procedures and diagnostic studies:   Dg Chest 2 View  11/16/2014: Enlargement of cardiac silhouette with pulmonary vascular congestion.  Chronic bronchitic changes with bibasilar atelectasis.     2D echo 11/17/14: Study Conclusions  - Left ventricle: The cavity size was normal. Wall thickness was normal. Systolic function was normal. The estimated ejection fraction  was in the range of 60% to 65%. Wall motion was normal; there were no regional wall motion abnormalities. Doppler parameters  are consistent with abnormal left ventricular relaxation (grade 1 diastolic dysfunction). - Left atrium: The atrium was moderately dilated. - Right atrium: The atrium was mildly to moderately dilated.  Dg Chest Port 1 View 11/20/2014: No definite evidence of lobar pneumonia or edema, though the lung bases are not well evaluated. If there is ongoing concern, a formal PA and lateral chest x-ray may be useful.  Atherosclerosis.     Medical Consultants:    None.  Anti-Infectives:    Levaquin 11/16/14---> 11/18/14  Subjective:   [name] is awake and alert.  Says her cough is "not bad".  Appetite "not too good".  Less dyspneic.  No N/V.   Objective:    Filed Vitals:   11/21/14 2027 11/21/14 2106 11/21/14 2339 11/22/14 0601  BP:  181/63 165/41 164/61  Pulse: 68 80 63 70  Temp:  98 F (36.7 C)  97.5 F (36.4 C)  TempSrc:  Oral  Oral  Resp: 18 18  18   Height:      Weight:      SpO2: 92% 94%  95%    Intake/Output Summary (Last 24 hours) at 11/22/14 0815 Last data filed at 11/21/14 1800  Gross per 24 hour  Intake    460 ml  Output    450 ml  Net     10 ml    Exam: Gen:  NAD, awake/alert, sitting up in chair Cardiovascular:  RRR, No M/R/G Respiratory:  Lungs decreased, no wheeze Gastrointestinal:  Abdomen soft, NT/ND, + BS Extremities:  2+ edema with venous stasis changes/hemosiderin deposits   Data Reviewed:    Labs: Basic Metabolic Panel:  Recent Labs Lab 11/17/14 0338 11/18/14 0350 11/19/14 0400 11/20/14 0500 11/21/14 0403  NA 136 133* 139 140 143  K 4.8 5.0 4.6 5.0 4.8  CL 98 96 97 96 92*  CO2 29 30 36* 37* 43*  GLUCOSE 167* 148* 181* 171* 165*  BUN 44* 58* 73* 69* 68*  CREATININE 1.80* 1.79* 1.66* 1.36* 1.31*  CALCIUM 8.7 8.7 9.3 9.5 10.0   GFR Estimated Creatinine Clearance: 41.5 mL/min (by C-G formula based on Cr of 1.31). Liver Function Tests:  Recent Labs Lab 11/17/14 0338  AST 24  ALT 14  ALKPHOS 61  BILITOT 0.3  PROT 7.2   ALBUMIN 3.0*   CBC:  Recent Labs Lab 11/17/14 0338 11/18/14 0350 11/19/14 0400 11/20/14 0500 11/21/14 0403  WBC 9.9 18.8* 17.0* 15.8* 11.0*  HGB 8.3* 8.3* 8.8* 8.8* 9.1*  HCT 28.2* 28.7* 31.2* 31.2* 31.7*  MCV 78.1 79.3 80.8 80.0 79.3  PLT 207 224 272 273 246   CBG:  Recent Labs Lab 11/20/14 2144 11/21/14 0743 11/21/14 1143 11/21/14 1648 11/21/14 2104  GLUCAP 275* 157* 177* 215* 136*   ABG    Component Value Date/Time   PHART 7.368 11/20/2014 1453   PCO2ART 70.3* 11/20/2014 1453   PO2ART 83.6 11/20/2014 1453   HCO3 39.5* 11/20/2014 1453   TCO2 37.1 11/20/2014 1453   O2SAT 95.4 11/20/2014 1453    Sepsis Labs:  Recent Labs Lab 11/16/14 1057 11/16/14 1717 11/16/14 1930  11/18/14 0350 11/19/14 0400 11/20/14 0500 11/21/14 0403  WBC  --   --   --   < > 18.8* 17.0* 15.8* 11.0*  LATICACIDVEN 2.65* 5.0* 4.4*  --   --   --   --   --   < > =  values in this interval not displayed. Microbiology Recent Results (from the past 240 hour(s))  Respiratory virus panel     Status: Abnormal   Collection Time: 11/16/14  3:38 PM  Result Value Ref Range Status   Respiratory Syncytial Virus A Negative Negative Final   Respiratory Syncytial Virus B Negative Negative Final   Influenza A Negative Negative Final   Influenza B Negative Negative Final   Parainfluenza 1 Negative Negative Final   Parainfluenza 2 Negative Negative Final   Parainfluenza 3 Negative Negative Final   Metapneumovirus Positive (A) Negative Final   Rhinovirus Negative Negative Final   Adenovirus Negative Negative Final    Comment: (NOTE) Performed At: [facility] [place], [place] [id number] Lindon Romp MD OL:[id number]   MRSA PCR Screening     Status: None   Collection Time: 11/20/14  3:35 PM  Result Value Ref Range Status   MRSA by PCR NEGATIVE NEGATIVE Final    Comment:        The GeneXpert MRSA Assay (FDA approved for NASAL specimens only), is one component of  a comprehensive MRSA colonization surveillance program. It is not intended to diagnose MRSA infection nor to guide or monitor treatment for MRSA infections.      Medications:   . allopurinol  100 mg Oral Daily  . antiseptic oral rinse  7 mL Mouth Rinse q12n4p  . aspirin EC  81 mg Oral Daily  . dextromethorphan  60 mg Oral BID  . feeding supplement (ENSURE ENLIVE)  237 mL Oral BID BM  . Fluticasone Furoate-Vilanterol  1 puff Inhalation Daily  . furosemide  40 mg Oral BID  . heparin  5,000 Units Subcutaneous 3 times per day  . insulin aspart  0-15 Units Subcutaneous TID WC  . insulin aspart  0-5 Units Subcutaneous QHS  . insulin aspart  5 Units Subcutaneous TID WC  . ipratropium-albuterol  3 mL Nebulization QID  . losartan  100 mg Oral Daily  . methylPREDNISolone (SOLU-MEDROL) injection  40 mg Intravenous Q12H  . metoprolol  50 mg Oral BID  . sodium chloride  3 mL Intravenous Q12H   Continuous Infusions:   Time spent: 25 minutes.    LOS: 6 days   [place] [facility] Pager [phone]. If unable to reach me by pager, please call my cell phone at [phone].  *Please refer to [email or web address], password [email or web address] to get updated schedule on who will round on this patient, as hospitalists switch teams weekly. If 7PM-7AM, please contact night-coverage at [email or web address], password [email or web address] for any overnight needs.  11/22/2014, 8:15 AM

## 2014-11-22 NOTE — Progress Notes (Signed)
Physical Therapy Treatment Patient Details Name: [name] MRN: [id number] DOB: 07/01/1941 Today's Date: 11/22/2014    History of Present Illness 74 yo female admitted with acute respiratory failure. Hx of COPD, chronic LE swelling, sleep apnea, HTN, DM, gout, venous stasis dermatitis, CHF    PT Comments    Pt tolerated treatment well. Pt gait improved from last session and pt was not as unsteady. SpO2 level was 96% on 2L O2 before ambulation and dropped down to 86% during ambulation. Pt is progressing well with PT. Pt declines SNF per chart and prefers HHPT.    Follow Up Recommendations  Home health PT;Supervision for mobility/OOB     Equipment Recommendations  None recommended by PT    Recommendations for Other Services       Precautions / Restrictions Precautions Precautions: Fall Precaution Comments: monitor O2 sats Restrictions Weight Bearing Restrictions: No    Mobility  Bed Mobility Overal bed mobility:  (pt in chair)             General bed mobility comments: in chair  Transfers Overall transfer level: Needs assistance Equipment used: Rolling walker (2 wheeled) Transfers: Sit to/from Stand Sit to Stand: Min guard         General transfer comment: min guard for safety  Ambulation/Gait Ambulation/Gait assistance: Min guard Ambulation Distance (Feet): 160 Feet Assistive device: Rolling walker (2 wheeled) Gait Pattern/deviations: Step-through pattern     General Gait Details: min guard for safety. verbal cues given for RW distance. pt ambulated on 2L O2. pt gait much improved from last session, pt was not as unsteady but still needs RW for support and cues for safe use of RW.   Stairs            Wheelchair Mobility    Modified Rankin (Stroke Patients Only)       Balance                                    Cognition Arousal/Alertness: Awake/alert Behavior During Therapy: WFL for tasks assessed/performed Overall  Cognitive Status: Within Functional Limits for tasks assessed                      Exercises      General Comments        Pertinent Vitals/Pain Pain Assessment: No/denies pain    Home Living                      Prior Function            PT Goals (current goals can now be found in the care plan section) Acute Rehab PT Goals PT Goal Formulation: With patient/family Time For Goal Achievement: 12/01/14 Potential to Achieve Goals: Good Progress towards PT goals: Progressing toward goals    Frequency  Min 3X/week    PT Plan Discharge plan needs to be updated    Co-evaluation             End of Session Equipment Utilized During Treatment: Gait belt;Oxygen Activity Tolerance: Patient tolerated treatment well Patient left: in chair;with call bell/phone within reach     Time: 1329-1341 PT Time Calculation (min) (ACUTE ONLY): 12 min  Charges:  $Gait Training: 8-22 mins                    G Codes:      Eswin Worrell  11/22/2014, 2:45 PM Charlott Holler, SPT

## 2014-11-23 ENCOUNTER — Other Ambulatory Visit: Payer: Self-pay | Admitting: Internal Medicine

## 2014-11-23 DIAGNOSIS — D638 Anemia in other chronic diseases classified elsewhere: Secondary | ICD-10-CM

## 2014-11-23 DIAGNOSIS — G4733 Obstructive sleep apnea (adult) (pediatric): Secondary | ICD-10-CM

## 2014-11-23 LAB — GLUCOSE, CAPILLARY
GLUCOSE-CAPILLARY: 198 mg/dL — AB (ref 70–99)
Glucose-Capillary: 103 mg/dL — ABNORMAL HIGH (ref 70–99)

## 2014-11-23 MED ORDER — DEXTROMETHORPHAN POLISTIREX ER 30 MG/5ML PO SUER: 60.0000 mg | Freq: Two times a day (BID) | ORAL | Status: DC

## 2014-11-23 MED ORDER — IPRATROPIUM-ALBUTEROL 0.5-2.5 (3) MG/3ML IN SOLN
3.0000 mL | Freq: Three times a day (TID) | RESPIRATORY_TRACT | Status: DC
Start: 2014-11-23 — End: 2014-11-23

## 2014-11-23 MED ORDER — PREDNISONE 10 MG (21) PO TBPK: ORAL_TABLET | ORAL | Status: DC

## 2014-11-23 MED ORDER — ENSURE ENLIVE PO LIQD
237.0000 mL | Freq: Two times a day (BID) | ORAL | Status: DC
Start: 2014-11-23 — End: 2015-02-23

## 2014-11-23 MED ORDER — ACETAMINOPHEN 325 MG PO TABS
650.0000 mg | ORAL_TABLET | Freq: Four times a day (QID) | ORAL | Status: DC | PRN
Start: 2014-11-23 — End: 2017-08-08

## 2014-11-23 NOTE — Progress Notes (Signed)
CARE MANAGEMENT NOTE 11/23/2014  Patient:  [name], [name]   Account Number:  [id number]  Date Initiated:  11/17/2014  Documentation initiated by:  Dessa Phi  Subjective/Objective Assessment:   74 y/o f admitted w/Acute resp failure w/hypoxia.RP:RXYV, RLE ulcer, Lung ca,CHF.     Action/Plan:   From home w/spouse.   Anticipated DC Date:  11/22/2014   Anticipated DC Plan:  [place]  CM consult      [facility] Choice  HOME HEALTH   Choice offered to / List presented to:  C-1 Patient   DME arranged  [place]      DME agency  [place] arranged  HH-2 PT  HH-1 RN  [place]      [place].   Status of service:  Completed, signed off Medicare Important Message given?  YES (If response is "NO", the following Medicare IM given date fields will be blank) Date Medicare IM given:  11/23/2014 Medicare IM given by:  Edwyna Shell Date Additional Medicare IM given:   Additional Medicare IM given by:    Discharge Disposition:  [place]  Per UR Regulation:  Reviewed for med. necessity/level of care/duration of stay  If discussed at [place] of Stay Meetings, dates discussed:    Comments:  11/23/14 Leanne Chang RN BSN CM [phone] Followed up with patient and granddaughter with CSW to discuss disposition choice. Patient stated that she will not go to a facility for rehab and that she feels comfortable going home with [facility] services and she understands the care that [facility] provides as she has had them before. She stated that she would [facility] to follow up with [facility] services and Cyril Mourning, [facility] liaison was provided with referral. Also communicated orders for DME, including home O2 to [facility] DME specialist Pura Spice. Patient's family will provide transportation home once the O2 is delivered to the room and home set up is delivered to the  house.  11/22/14 Leanne Chang RN BSn CM [phone] Patient declined SNF per PT recommendation. She lives at home with her spouse and states she will retrun home. She has had [place] services in the past with [facility] and would like [facility] to continue to provided [facility] services. She has a rolling walker and shower chair at home. Patient remains on O2 at 2L will continue to follow for [facility] orders and if need for home O2.  11/18/14 Dessa Phi RN BSN NCM [phone] [facility] aware of HHRN/PT order.Await d/c order.  11/17/14 Dessa Phi RN BSN NCM [phone] PT-HH.[facility] chosen for [facility].TC Kristen rep aware of HHPT order,await d/c order.WOC cons-RLE wounds. On 02 will monitor.Monitor progress for d/c needs.

## 2014-11-23 NOTE — Progress Notes (Signed)
[name] to be D/C'd Home per MD order.  Discussed prescriptions and follow up appointments with the patient. Prescriptions given to patient, medication list explained in detail. Pt verbalized understanding.    Medication List    STOP taking these medications        naproxen sodium 220 MG tablet  Commonly known as:  ANAPROX      TAKE these medications        acetaminophen 325 MG tablet  Commonly known as:  TYLENOL  Take 2 tablets (650 mg total) by mouth every 6 (six) hours as needed for mild pain or headache (or Fever >/= 101).     albuterol 108 (90 BASE) MCG/ACT inhaler  Commonly known as:  PROAIR HFA  Inhale 1-2 puffs into the lungs every 6 (six) hours as needed for wheezing or shortness of breath.     allopurinol 100 MG tablet  Commonly known as:  ZYLOPRIM  TAKE 1 TABLET BY MOUTH DAILY FOR GOUT     aspirin 81 MG EC tablet  Take 81 mg by mouth daily.     dextromethorphan 30 MG/5ML liquid  Commonly known as:  DELSYM  Take 10 mLs (60 mg total) by mouth 2 (two) times daily.     feeding supplement (ENSURE ENLIVE) Liqd  Take 237 mLs by mouth 2 (two) times daily between meals.     Fluticasone Furoate-Vilanterol 100-25 MCG/INH Aepb  Commonly known as:  BREO ELLIPTA  Inhale 1 puff into the lungs daily.     furosemide 40 MG tablet  Commonly known as:  LASIX  TAKE 1 TABLET BY MOUTH TWICE DAILY     ketoconazole 2 % cream  Commonly known as:  NIZORAL  Apply 1 application topically 2 (two) times daily.     losartan 100 MG tablet  Commonly known as:  COZAAR  TAKE 1 TABLET BY MOUTH EVERY DAY     metoprolol 50 MG tablet  Commonly known as:  LOPRESSOR  Take 1 tablet (50 mg total) by mouth 2 (two) times daily.     predniSONE 10 MG (21) Tbpk tablet  Commonly known as:  STERAPRED UNI-PAK 21 TAB  Take 6 tabs 4/27, then decrease by 10 mg daily until off.     VASCULERA Tabs  TAKE 1 TABLET BY MOUTH EVERY DAY.     Vitamin D (Ergocalciferol) 50000 UNITS Caps capsule   Commonly known as:  DRISDOL  TAKE 1 CAPSULE BY MOUTH ONCE A WEEK        Filed Vitals:   11/23/14 0553  BP: 155/57  Pulse: 69  Temp: 98.4 F (36.9 C)  Resp: 18    Skin clean, dry and intact without evidence of skin break down, no evidence of skin tears noted. IV catheter discontinued intact. Site without signs and symptoms of complications. Dressing and pressure applied. Pt denies pain at this time. No complaints noted.  An After Visit Summary was printed and given to the patient. Patient escorted via [place], and D/C home via private auto.  Lolita Rieger 11/23/2014 1:49 PM

## 2014-11-23 NOTE — Discharge Summary (Signed)
Physician Discharge Summary  [name]:[id number] DOB: 1941-06-22 DOA: 11/16/2014  PCP: [name], MD  Admit date: 11/16/2014 Discharge date: 11/23/2014   Recommendations for Outpatient Follow-Up:   1. F/U scheduled with Dr. Ronnald Ramp 11/25/14.  Please assess respiratory status, transition to home.  May need to consider SNF placement for rehab if too weak/deconditioned to manage.  Refused SNF.  Home oxygen set up. 2. Please reschedule outpatient sleep study. Patient did not follow-up with this, and likely has underlying OSA.   Discharge Diagnosis:   Principal Problem:    Acute bronchiolitis due to human metapneumovirus Active Problems:    Hyperlipidemia with target LDL less than 100    Gout    HTN (hypertension)    COPD exacerbation    CKD (chronic kidney disease), stage III    Bilateral lower extremity edema    Prediabetes    Monoclonal paraproteinemia    Acute on chronic diastolic CHF (congestive heart failure)    Venous stasis dermatitis    OSA (obstructive sleep apnea)    Acute respiratory failure with hypoxia    Anemia of chronic disease/critical illness    Cor pulmonale    COLD (chronic obstructive lung disease)    Venous stasis ulcer of lower extremity   Discharge disposition:  Home with home health nursing services.  Discharge Condition: Improved.  Diet recommendation: Low sodium, heart healthy.    Wound care: The patient should have Unna boots applied twice per week with home health RN to monitor wound status. Will need long-term compression hose and ulceration healed.   History of Present Illness:   [name] is an 74 y.o. female with a PMH of MGUS, chronic nonhealing right lower extremity ulcer/cellulitis, chronic diastolic CHF, hypertension and stage III CKD sent to the ED for/19/16 by her PCP for evaluation of worsening upper respiratory symptoms including shortness of breath, cough, and rhinorrhea. The patient was moved to  the SDU 11/20/14 due to AMS and hypercarbic respiratory failure necessitating Bipap.  Hospital Course by Problem:   Principal Problem:  Acute respiratory failure with hypoxia and hypercarbia/COPD exacerbation/viral URI/acute on chronic diastolic CHF - Respiratory virus panel positive for metapneumovirus. Empiric Levaquin discontinued 11/18/14. - Weaned Solumedrol to prednisone starting 11/23/14. D/C home on prednisone taper. - Continue bronchodilators. - Continue Pulmonary toileting with incentive spirometry. - Continue supportive care with oxygen. Qualifies for home oxygen with saturations of 89% at rest, 86% with ambulation. - Last echocardiogram in 2014 showed EF 55-60 %, repeat echo shows EF 60-65 %/grade 1 diastolic dysfunction. - On 11/20/14, the patient developed worsening mental status. ABG showed hypercarbia. Moved to the SDU & bipap initiated. - Bipap was weaned the evening of 11/20/14, remains on N/C.   Active Problems:  Altered mental status - Likely secondary to hypercarbic respiratory failure. Tussionex discontinued 11/20/14. Avoid sedating meds. - Improved.   Venous stasis ulcer - Status post wound care RN evaluation 3/47/42. - Silicone foam dressing/Unna boots to the right lower extremity ordered. - We'll need home health RN for [employer] changes twice per week and to monitor wound post discharge.   HTN (hypertension) - Systolic blood pressure is 140s-150s over the past 24 hours. Continue hydralazine as needed. - Continue Lasix, Cozaar and Lopressor.   CKD (chronic kidney disease), stage III - Current creatinine appears at usual baseline values. - Listed home medication of Anaprox-likely need to reconsider utilization of this medicine in the future.   Bilateral lower extremity edema/venous stasis dermatitis -  On Lasix 40 mg twice a day.   Prediabetes / steroid-induced hyperglycemia - Hemoglobin A1c was 6.03 April 2014 currently 6.3. Dietician consult for  diet education. - CBGs 125-195. Currently on moderate scale SSI with 5 units of NovoLog for meal coverage. - Change diet to CM.   OSA (obstructive sleep apnea) - Stable. Does not use CPAP. Did not follow up for sleep study, may need to re-schedule when stable.   Hyperlipidemia with target LDL less than 100 - Not on medications prior to admission.   Gout - Currently not expressing symptoms consistent with exacerbation. - Continue preadmission dose of allopurinol.   Monoclonal paraproteinemia - Stable and followed by Dr. Alen Blew in outpatient setting, last seen December 2015.   Anemia of chronic disease/critical illness - Baseline hemoglobin appears to be between 10.8 and 11.9. - Current hemoglobin stable but lower than usual baseline values. - Suspect slight drop in usual baseline values related to hemodilution.    Medical Consultants:    None.   Discharge Exam:   Filed Vitals:   11/23/14 0553  BP: 155/57  Pulse: 69  Temp: 98.4 F (36.9 C)  Resp: 18   Filed Vitals:   11/22/14 1753 11/22/14 2130 11/23/14 0553 11/23/14 1124  BP:  148/58 155/57   Pulse:  83 69   Temp:  97.8 F (36.6 C) 98.4 F (36.9 C)   TempSrc:  Oral Oral   Resp:  18 18   Height:      Weight: 110 kg (242 lb 8.1 oz)     SpO2:  95% 95% 93%    Gen:  NAD Cardiovascular:  RRR, No M/R/G Respiratory: Lungs diminished Gastrointestinal: Abdomen soft, NT/ND with normal active bowel sounds. Extremities: Unna boots, +edema   The results of significant diagnostics from this hospitalization (including imaging, microbiology, ancillary and laboratory) are listed below for reference.     Procedures and Diagnostic Studies:   Dg Chest 2 View 11/16/2014: Enlargement of cardiac silhouette with pulmonary vascular congestion. Chronic bronchitic changes with bibasilar atelectasis.   2D echo 11/17/14: Study Conclusions  - Left ventricle: The cavity size was normal. Wall thickness was normal.  Systolic function was normal. The estimated ejection fraction was in the range of 60% to 65%. Wall motion was normal; there were no regional wall motion abnormalities. Doppler parameters are consistent with abnormal left ventricular relaxation (grade 1 diastolic dysfunction). - Left atrium: The atrium was moderately dilated. - Right atrium: The atrium was mildly to moderately dilated.  Dg Chest Port 1 View 11/20/2014: No definite evidence of lobar pneumonia or edema, though the lung bases are not well evaluated. If there is ongoing concern, a formal PA and lateral chest x-ray may be useful. Atherosclerosis.    Labs:   Basic Metabolic Panel:  Recent Labs Lab 11/17/14 0338 11/18/14 0350 11/19/14 0400 11/20/14 0500 11/21/14 0403  NA 136 133* 139 140 143  K 4.8 5.0 4.6 5.0 4.8  CL 98 96 97 96 92*  CO2 29 30 36* 37* 43*  GLUCOSE 167* 148* 181* 171* 165*  BUN 44* 58* 73* 69* 68*  CREATININE 1.80* 1.79* 1.66* 1.36* 1.31*  CALCIUM 8.7 8.7 9.3 9.5 10.0   GFR Estimated Creatinine Clearance: 42.4 mL/min (by C-G formula based on Cr of 1.31). Liver Function Tests:  Recent Labs Lab 11/17/14 0338  AST 24  ALT 14  ALKPHOS 61  BILITOT 0.3  PROT 7.2  ALBUMIN 3.0*   CBC:  Recent Labs Lab 11/17/14 0338 11/18/14 0350  11/19/14 0400 11/20/14 0500 11/21/14 0403  WBC 9.9 18.8* 17.0* 15.8* 11.0*  HGB 8.3* 8.3* 8.8* 8.8* 9.1*  HCT 28.2* 28.7* 31.2* 31.2* 31.7*  MCV 78.1 79.3 80.8 80.0 79.3  PLT 207 224 272 273 246   CBG:  Recent Labs Lab 11/22/14 1142 11/22/14 1709 11/22/14 2044 11/23/14 0737 11/23/14 1129  GLUCAP 195* 139* 125* 103* 198*   Microbiology Recent Results (from the past 240 hour(s))  Respiratory virus panel     Status: Abnormal   Collection Time: 11/16/14  3:38 PM  Result Value Ref Range Status   Respiratory Syncytial Virus A Negative Negative Final   Respiratory Syncytial Virus B Negative Negative Final   Influenza A Negative Negative Final    Influenza B Negative Negative Final   Parainfluenza 1 Negative Negative Final   Parainfluenza 2 Negative Negative Final   Parainfluenza 3 Negative Negative Final   Metapneumovirus Positive (A) Negative Final   Rhinovirus Negative Negative Final   Adenovirus Negative Negative Final    Comment: (NOTE) Performed At: [facility] [place], [place] [id number] Lindon Romp MD OA:[id number]   MRSA PCR Screening     Status: None   Collection Time: 11/20/14  3:35 PM  Result Value Ref Range Status   MRSA by PCR NEGATIVE NEGATIVE Final    Comment:        The GeneXpert MRSA Assay (FDA approved for NASAL specimens only), is one component of a comprehensive MRSA colonization surveillance program. It is not intended to diagnose MRSA infection nor to guide or monitor treatment for MRSA infections.      Discharge Instructions:   Discharge Instructions    Call MD for:  difficulty breathing, headache or visual disturbances    Complete by:  As directed      Call MD for:  extreme fatigue    Complete by:  As directed      Call MD for:  persistant dizziness or light-headedness    Complete by:  As directed      Call MD for:  temperature >100.4    Complete by:  As directed      DME Bedside commode    Complete by:  As directed      Diet - low sodium heart healthy    Complete by:  As directed      Discharge instructions    Complete by:  As directed   You were cared for by Dr. Jacquelynn Cree  (a hospitalist) during your hospital stay. If you have any questions about your discharge medications or the care you received while you were in the hospital after you are discharged, you can call the unit and ask to speak with the hospitalist on call if the hospitalist that took care of you is not available. Once you are discharged, your primary care physician will handle any further medical issues. Please note that NO REFILLS for any discharge medications will be authorized once you  are discharged, as it is imperative that you return to your primary care physician (or establish a relationship with a primary care physician if you do not have one) for your aftercare needs so that they can reassess your need for medications and monitor your lab values.  Any outstanding tests can be reviewed by your PCP at your follow up visit.  It is also important to review any medicine changes with your PCP.  Please bring these d/c instructions with you to your next visit so your physician  can review these changes with you.     Face-to-face encounter (required for Medicare/Medicaid patients)    Complete by:  As directed   I RAMA,CHRISTINA certify that this patient is under my care and that I, or a nurse practitioner or physician's assistant working with me, had a face-to-face encounter that meets the physician face-to-face encounter requirements with this patient on 11/23/2014. The encounter with the patient was in whole, or in part for the following medical condition(s) which is the primary reason for home health care (List medical condition): Deconditioning after a 1 week hospital stay for severe viral URI.  Hypoxia.  Needs RN to teach disease process, symptom management and monitor for deterioration, high risk for re-hospitalization.  The encounter with the patient was in whole, or in part, for the following medical condition, which is the primary reason for home health care:  Deconditioning after severe viral illness, acute resp failure  I certify that, based on my findings, the following services are medically necessary home health services:   Nursing Physical therapy    Reason for Medically Necessary Home Health Services:   Skilled Nursing- Skilled Assessment/Observation Therapy- Home Adaptation to Facilitate Safety Therapy- Therapeutic Exercises to Increase Strength and Endurance    My clinical findings support the need for the above services:  Shortness of breath with activity  Further, I  certify that my clinical findings support that this patient is homebound due to:  Unable to leave home safely without assistance     For home use only DME oxygen    Complete by:  As directed   Mode or (Route):  Nasal cannula  Liters per Minute:  2  Frequency:  Continuous (stationary and portable oxygen unit needed)  Oxygen conserving device:  Yes  Oxygen delivery system:  Gas     Home Health    Complete by:  As directed   To provide the following care/treatments:   PT RN [place]    Complete by:  As directed   To provide the following care/treatments:  Social work     Increase activity slowly    Complete by:  As directed      Walk with assistance    Complete by:  As directed      Walker     Complete by:  As directed             Medication List    STOP taking these medications        naproxen sodium 220 MG tablet  Commonly known as:  ANAPROX      TAKE these medications        acetaminophen 325 MG tablet  Commonly known as:  TYLENOL  Take 2 tablets (650 mg total) by mouth every 6 (six) hours as needed for mild pain or headache (or Fever >/= 101).     albuterol 108 (90 BASE) MCG/ACT inhaler  Commonly known as:  PROAIR HFA  Inhale 1-2 puffs into the lungs every 6 (six) hours as needed for wheezing or shortness of breath.     allopurinol 100 MG tablet  Commonly known as:  ZYLOPRIM  TAKE 1 TABLET BY MOUTH DAILY FOR GOUT     aspirin 81 MG EC tablet  Take 81 mg by mouth daily.     dextromethorphan 30 MG/5ML liquid  Commonly known as:  DELSYM  Take 10 mLs (60 mg total) by mouth 2 (two) times daily.  feeding supplement (ENSURE ENLIVE) Liqd  Take 237 mLs by mouth 2 (two) times daily between meals.     Fluticasone Furoate-Vilanterol 100-25 MCG/INH Aepb  Commonly known as:  BREO ELLIPTA  Inhale 1 puff into the lungs daily.     furosemide 40 MG tablet  Commonly known as:  LASIX  TAKE 1 TABLET BY MOUTH TWICE DAILY     ketoconazole 2 %  cream  Commonly known as:  NIZORAL  Apply 1 application topically 2 (two) times daily.     losartan 100 MG tablet  Commonly known as:  COZAAR  TAKE 1 TABLET BY MOUTH EVERY DAY     metoprolol 50 MG tablet  Commonly known as:  LOPRESSOR  Take 1 tablet (50 mg total) by mouth 2 (two) times daily.     predniSONE 10 MG (21) Tbpk tablet  Commonly known as:  STERAPRED UNI-PAK 21 TAB  Take 6 tabs 4/27, then decrease by 10 mg daily until off.     VASCULERA Tabs  TAKE 1 TABLET BY MOUTH EVERY DAY.     Vitamin D (Ergocalciferol) 50000 UNITS Caps capsule  Commonly known as:  DRISDOL  TAKE 1 CAPSULE BY MOUTH ONCE A WEEK           Follow-up Information    Follow up with [facility].   Why:  HHPT   Contact information:   [address] [place] [address] [phone]       Follow up with [name], MD On 11/25/2014.   Specialty:  Internal Medicine   Why:  Please follow up with Dr. Ronnald Ramp on Thursday, April 28th at 2pm.   Contact information:   [address] FLO[address] [place] [address] [phone]        Time coordinating discharge: 35 minutes.  Signed:  RAMA,CHRISTINA  Pager [phone] [facility] 11/23/2014, 1:43 PM

## 2014-11-24 ENCOUNTER — Telehealth: Payer: Self-pay | Admitting: *Deleted

## 2014-11-24 DIAGNOSIS — J211 Acute bronchiolitis due to human metapneumovirus: Secondary | ICD-10-CM | POA: Diagnosis not present

## 2014-11-24 DIAGNOSIS — N183 Chronic kidney disease, stage 3 (moderate): Secondary | ICD-10-CM | POA: Diagnosis not present

## 2014-11-24 DIAGNOSIS — L03115 Cellulitis of right lower limb: Secondary | ICD-10-CM | POA: Diagnosis not present

## 2014-11-24 DIAGNOSIS — I5033 Acute on chronic diastolic (congestive) heart failure: Secondary | ICD-10-CM | POA: Diagnosis not present

## 2014-11-24 DIAGNOSIS — I129 Hypertensive chronic kidney disease with stage 1 through stage 4 chronic kidney disease, or unspecified chronic kidney disease: Secondary | ICD-10-CM | POA: Diagnosis not present

## 2014-11-24 DIAGNOSIS — D638 Anemia in other chronic diseases classified elsewhere: Secondary | ICD-10-CM | POA: Diagnosis not present

## 2014-11-24 DIAGNOSIS — R600 Localized edema: Secondary | ICD-10-CM | POA: Diagnosis not present

## 2014-11-24 DIAGNOSIS — M109 Gout, unspecified: Secondary | ICD-10-CM | POA: Diagnosis not present

## 2014-11-24 DIAGNOSIS — J449 Chronic obstructive pulmonary disease, unspecified: Secondary | ICD-10-CM | POA: Diagnosis not present

## 2014-11-24 DIAGNOSIS — E785 Hyperlipidemia, unspecified: Secondary | ICD-10-CM | POA: Diagnosis not present

## 2014-11-24 DIAGNOSIS — G4733 Obstructive sleep apnea (adult) (pediatric): Secondary | ICD-10-CM | POA: Diagnosis not present

## 2014-11-24 NOTE — Telephone Encounter (Signed)
Transition Care Management Follow-up Telephone Call D/C 11/23/14 How have you been since you were released from the hospital? Pt states she is bout the same   Do you understand why you were in the hospital? YES   Do you understand the discharge instrcutions? YES  Items Reviewed:  Medications reviewed: YES  Allergies reviewed: YES  Dietary changes reviewed: YES  Referrals reviewed: YES, [facility] this am   Functional Questionnaire:   Activities of Daily Living (ADLs):   She states they are independent in the following: feeding and grooming States they require assistance with the following: ambulation, bathing and hygiene, continence, toileting and dressing   Any transportation issues/concerns?: NO   Any patient concerns? NO   Confirmed importance and date/time of follow-up visits scheduled: YES, appt made 11/25/14 w/Dr. Ronnald Ramp   Confirmed with patient if condition begins to worsen call PCP or go to the ER.

## 2014-11-25 ENCOUNTER — Ambulatory Visit (INDEPENDENT_AMBULATORY_CARE_PROVIDER_SITE_OTHER): Payer: Medicare Other | Admitting: Internal Medicine

## 2014-11-25 ENCOUNTER — Other Ambulatory Visit (INDEPENDENT_AMBULATORY_CARE_PROVIDER_SITE_OTHER): Payer: Medicare Other

## 2014-11-25 VITALS — BP 136/60 | HR 68 | Temp 97.9°F | Resp 20 | Ht 60.0 in | Wt 226.0 lb

## 2014-11-25 DIAGNOSIS — N183 Chronic kidney disease, stage 3 unspecified: Secondary | ICD-10-CM

## 2014-11-25 DIAGNOSIS — R600 Localized edema: Secondary | ICD-10-CM | POA: Diagnosis not present

## 2014-11-25 DIAGNOSIS — L03115 Cellulitis of right lower limb: Secondary | ICD-10-CM | POA: Diagnosis not present

## 2014-11-25 DIAGNOSIS — K921 Melena: Secondary | ICD-10-CM

## 2014-11-25 DIAGNOSIS — J441 Chronic obstructive pulmonary disease with (acute) exacerbation: Secondary | ICD-10-CM

## 2014-11-25 DIAGNOSIS — D510 Vitamin B12 deficiency anemia due to intrinsic factor deficiency: Secondary | ICD-10-CM

## 2014-11-25 DIAGNOSIS — I5033 Acute on chronic diastolic (congestive) heart failure: Secondary | ICD-10-CM

## 2014-11-25 DIAGNOSIS — M109 Gout, unspecified: Secondary | ICD-10-CM | POA: Diagnosis not present

## 2014-11-25 DIAGNOSIS — E785 Hyperlipidemia, unspecified: Secondary | ICD-10-CM

## 2014-11-25 DIAGNOSIS — E559 Vitamin D deficiency, unspecified: Secondary | ICD-10-CM | POA: Diagnosis not present

## 2014-11-25 DIAGNOSIS — G4733 Obstructive sleep apnea (adult) (pediatric): Secondary | ICD-10-CM | POA: Diagnosis not present

## 2014-11-25 DIAGNOSIS — D638 Anemia in other chronic diseases classified elsewhere: Secondary | ICD-10-CM | POA: Diagnosis not present

## 2014-11-25 DIAGNOSIS — I129 Hypertensive chronic kidney disease with stage 1 through stage 4 chronic kidney disease, or unspecified chronic kidney disease: Secondary | ICD-10-CM | POA: Diagnosis not present

## 2014-11-25 DIAGNOSIS — J449 Chronic obstructive pulmonary disease, unspecified: Secondary | ICD-10-CM | POA: Diagnosis not present

## 2014-11-25 DIAGNOSIS — J211 Acute bronchiolitis due to human metapneumovirus: Secondary | ICD-10-CM | POA: Diagnosis not present

## 2014-11-25 LAB — CBC WITH DIFFERENTIAL/PLATELET
BASOS PCT: 0.4 % (ref 0.0–3.0)
Basophils Absolute: 0.1 10*3/uL (ref 0.0–0.1)
EOS PCT: 0.1 % (ref 0.0–5.0)
Eosinophils Absolute: 0 10*3/uL (ref 0.0–0.7)
HCT: 30.3 % — ABNORMAL LOW (ref 36.0–46.0)
Hemoglobin: 9.8 g/dL — ABNORMAL LOW (ref 12.0–15.0)
Lymphocytes Relative: 5.4 % — ABNORMAL LOW (ref 12.0–46.0)
Lymphs Abs: 0.8 10*3/uL (ref 0.7–4.0)
MCHC: 32.3 g/dL (ref 30.0–36.0)
MCV: 71.5 fl — ABNORMAL LOW (ref 78.0–100.0)
MONO ABS: 0.3 10*3/uL (ref 0.1–1.0)
Monocytes Relative: 1.9 % — ABNORMAL LOW (ref 3.0–12.0)
Neutro Abs: 13.7 10*3/uL — ABNORMAL HIGH (ref 1.4–7.7)
Neutrophils Relative %: 92.2 % — ABNORMAL HIGH (ref 43.0–77.0)
PLATELETS: 261 10*3/uL (ref 150.0–400.0)
RBC: 4.24 Mil/uL (ref 3.87–5.11)
RDW: 17.8 % — AB (ref 11.5–15.5)
WBC: 14.9 10*3/uL — ABNORMAL HIGH (ref 4.0–10.5)

## 2014-11-25 LAB — LIPID PANEL
Cholesterol: 164 mg/dL (ref 0–200)
HDL: 53.4 mg/dL (ref >39.00)
LDL Cholesterol: 83 mg/dL (ref 0–99)
NONHDL: 110.6
TRIGLYCERIDES: 140 mg/dL (ref 0.0–149.0)
Total CHOL/HDL Ratio: 3
VLDL: 28 mg/dL (ref 0.0–40.0)

## 2014-11-25 LAB — FOLATE: FOLATE: 16.2 ng/mL (ref >5.9)

## 2014-11-25 LAB — IBC PANEL
Iron: 117 ug/dL (ref 42–145)
Saturation Ratios: 28.5 % (ref 20.0–50.0)
Transferrin: 293 mg/dL (ref 212.0–360.0)

## 2014-11-25 LAB — BASIC METABOLIC PANEL
BUN: 71 mg/dL — ABNORMAL HIGH (ref 6–23)
CHLORIDE: 91 meq/L — AB (ref 96–112)
CO2: 40 mEq/L — ABNORMAL HIGH (ref 19–32)
Calcium: 9.5 mg/dL (ref 8.4–10.5)
Creatinine, Ser: 1.62 mg/dL — ABNORMAL HIGH (ref 0.40–1.20)
GFR: 32.98 mL/min — ABNORMAL LOW (ref >60.00)
Glucose, Bld: 156 mg/dL — ABNORMAL HIGH (ref 70–99)
POTASSIUM: 4.2 meq/L (ref 3.5–5.1)
Sodium: 136 mEq/L (ref 135–145)

## 2014-11-25 LAB — TSH: TSH: 1.03 u[IU]/mL (ref 0.35–4.50)

## 2014-11-25 LAB — FERRITIN: Ferritin: 31 ng/mL (ref 10.0–291.0)

## 2014-11-25 LAB — VITAMIN B12: Vitamin B-12: 553 pg/mL (ref 211–911)

## 2014-11-25 NOTE — Progress Notes (Signed)
Pre visit review using our clinic review tool, if applicable. No additional management support is needed unless otherwise documented below in the visit note. 

## 2014-11-25 NOTE — Patient Instructions (Signed)
Anemia, Nonspecific Anemia is a condition in which the concentration of red blood cells or hemoglobin in the blood is below normal. Hemoglobin is a substance in red blood cells that carries oxygen to the tissues of the body. Anemia results in not enough oxygen reaching these tissues.  CAUSES  Common causes of anemia include:   Excessive bleeding. Bleeding may be internal or external. This includes excessive bleeding from periods (in women) or from the intestine.   Poor nutrition.   Chronic kidney, thyroid, and liver disease.  Bone marrow disorders that decrease red blood cell production.  Cancer and treatments for cancer.  HIV, AIDS, and their treatments.  Spleen problems that increase red blood cell destruction.  Blood disorders.  Excess destruction of red blood cells due to infection, medicines, and autoimmune disorders. SIGNS AND SYMPTOMS   Minor weakness.   Dizziness.   Headache.  Palpitations.   Shortness of breath, especially with exercise.   Paleness.  Cold sensitivity.  Indigestion.  Nausea.  Difficulty sleeping.  Difficulty concentrating. Symptoms may occur suddenly or they may develop slowly.  DIAGNOSIS  Additional blood tests are often needed. These help your health care provider determine the best treatment. Your health care provider will check your stool for blood and look for other causes of blood loss.  TREATMENT  Treatment varies depending on the cause of the anemia. Treatment can include:   Supplements of iron, vitamin B12, or folic acid.   Hormone medicines.   A blood transfusion. This may be needed if blood loss is severe.   Hospitalization. This may be needed if there is significant continual blood loss.   Dietary changes.  Spleen removal. HOME CARE INSTRUCTIONS Keep all follow-up appointments. It often takes many weeks to correct anemia, and having your health care provider check on your condition and your response to  treatment is very important. SEEK IMMEDIATE MEDICAL CARE IF:   You develop extreme weakness, shortness of breath, or chest pain.   You become dizzy or have trouble concentrating.  You develop heavy vaginal bleeding.   You develop a rash.   You have bloody or black, tarry stools.   You faint.   You vomit up blood.   You vomit repeatedly.   You have abdominal pain.  You have a fever or persistent symptoms for more than 2-3 days.   You have a fever and your symptoms suddenly get worse.   You are dehydrated.  MAKE SURE YOU:  Understand these instructions.  Will watch your condition.  Will get help right away if you are not doing well or get worse. Document Released: 08/23/2004 Document Revised: 03/18/2013 Document Reviewed: 01/09/2013 ExitCare Patient Information 2015 [employer]. This information is not intended to replace advice given to you by your health care provider. Make sure you discuss any questions you have with your health care provider.  

## 2014-11-25 NOTE — Progress Notes (Signed)
Subjective:    Patient ID: [name], female    DOB: [birth year], 74 y.o.   MRN: [id number]  Anemia Presents for follow-up visit. Symptoms include malaise/fatigue. There has been no abdominal pain, anorexia, bruising/bleeding easily, confusion, fever, leg swelling, light-headedness, pallor, palpitations, paresthesias, pica or weight loss. Signs of blood loss that are present include hematochezia. Signs of blood loss that are not present include hematemesis, melena and vaginal bleeding. Past medical history includes malnutrition and recent illness.      Review of Systems  Constitutional: Positive for malaise/fatigue. Negative for fever, chills, weight loss, diaphoresis, appetite change and fatigue.  HENT: Negative.   Eyes: Negative.   Respiratory: Positive for cough and shortness of breath. Negative for apnea, choking, chest tightness, wheezing and stridor.   Cardiovascular: Negative.  Negative for chest pain, palpitations and leg swelling.  Gastrointestinal: Positive for diarrhea, constipation, blood in stool and hematochezia. Negative for nausea, vomiting, abdominal pain, melena, anorexia and hematemesis.  Endocrine: Negative.   Genitourinary: Negative.  Negative for vaginal bleeding.  Musculoskeletal: Positive for arthralgias. Negative for myalgias, back pain and joint swelling.  Skin: Negative.  Negative for pallor.  Allergic/Immunologic: Negative.   Neurological: Negative.  Negative for dizziness, speech difficulty, light-headedness, numbness, headaches and paresthesias.  Hematological: Negative.  Negative for adenopathy. Does not bruise/bleed easily.  Psychiatric/Behavioral: Negative.  Negative for confusion.       Objective:   Physical Exam  Constitutional: She is oriented to person, place, and time. She appears well-developed and well-nourished.  Non-toxic appearance. She does not have a sickly appearance. She does not appear ill. No distress.  HENT:  Head:  Normocephalic and atraumatic.  Mouth/Throat: Oropharynx is clear and moist. No oropharyngeal exudate.  Eyes: Conjunctivae are normal. Right eye exhibits no discharge. Left eye exhibits no discharge. No scleral icterus.  Neck: Normal range of motion. Neck supple. No JVD present. No tracheal deviation present. No thyromegaly present.  Cardiovascular: Normal rate, regular rhythm, normal heart sounds and intact distal pulses.  Exam reveals no gallop and no friction rub.   No murmur heard. Pulmonary/Chest: Effort normal. No stridor. No respiratory distress. She has no decreased breath sounds. She has no wheezes. She has rhonchi in the right upper field, the right middle field, the right lower field, the left upper field, the left middle field and the left lower field. She has no rales. She exhibits no tenderness.  She has good air movement  Abdominal: Soft. Bowel sounds are normal. She exhibits no distension and no mass. There is no tenderness. There is no rebound and no guarding.  Musculoskeletal: She exhibits edema (trace edema in BLE).  Lymphadenopathy:    She has no cervical adenopathy.  Neurological: She is oriented to person, place, and time.  Skin: Skin is warm and dry. No rash noted. She is not diaphoretic. No erythema. No pallor.  Vitals reviewed.    Lab Results  Component Value Date   WBC 11.0* 11/21/2014   HGB 9.1* 11/21/2014   HCT 31.7* 11/21/2014   PLT 246 11/21/2014   GLUCOSE 165* 11/21/2014   CHOL 105 08/31/2013   TRIG 143.0 08/31/2013   HDL 43.20 08/31/2013   LDLDIRECT 135.3 11/20/2010   LDLCALC 33 08/31/2013   ALT 14 11/17/2014   AST 24 11/17/2014   NA 143 11/21/2014   K 4.8 11/21/2014   CL 92* 11/21/2014   CREATININE 1.31* 11/21/2014   BUN 68* 11/21/2014   CO2 43* 11/21/2014   TSH 3.68 04/14/2014  HGBA1C 6.3* 11/16/2014   MICROALBUR 15.9* 02/05/2013       Assessment & Plan:

## 2014-11-26 DIAGNOSIS — I87311 Chronic venous hypertension (idiopathic) with ulcer of right lower extremity: Secondary | ICD-10-CM | POA: Diagnosis not present

## 2014-11-26 DIAGNOSIS — I87312 Chronic venous hypertension (idiopathic) with ulcer of left lower extremity: Secondary | ICD-10-CM | POA: Diagnosis not present

## 2014-11-27 DIAGNOSIS — G4733 Obstructive sleep apnea (adult) (pediatric): Secondary | ICD-10-CM | POA: Diagnosis not present

## 2014-11-27 DIAGNOSIS — M109 Gout, unspecified: Secondary | ICD-10-CM | POA: Diagnosis not present

## 2014-11-27 DIAGNOSIS — R600 Localized edema: Secondary | ICD-10-CM | POA: Diagnosis not present

## 2014-11-27 DIAGNOSIS — I5033 Acute on chronic diastolic (congestive) heart failure: Secondary | ICD-10-CM | POA: Diagnosis not present

## 2014-11-27 DIAGNOSIS — N183 Chronic kidney disease, stage 3 (moderate): Secondary | ICD-10-CM | POA: Diagnosis not present

## 2014-11-27 DIAGNOSIS — D638 Anemia in other chronic diseases classified elsewhere: Secondary | ICD-10-CM | POA: Diagnosis not present

## 2014-11-27 DIAGNOSIS — J449 Chronic obstructive pulmonary disease, unspecified: Secondary | ICD-10-CM | POA: Diagnosis not present

## 2014-11-27 DIAGNOSIS — I129 Hypertensive chronic kidney disease with stage 1 through stage 4 chronic kidney disease, or unspecified chronic kidney disease: Secondary | ICD-10-CM | POA: Diagnosis not present

## 2014-11-27 DIAGNOSIS — L03115 Cellulitis of right lower limb: Secondary | ICD-10-CM | POA: Diagnosis not present

## 2014-11-27 DIAGNOSIS — J211 Acute bronchiolitis due to human metapneumovirus: Secondary | ICD-10-CM | POA: Diagnosis not present

## 2014-11-27 DIAGNOSIS — E785 Hyperlipidemia, unspecified: Secondary | ICD-10-CM | POA: Diagnosis not present

## 2014-11-28 ENCOUNTER — Encounter: Payer: Self-pay | Admitting: Internal Medicine

## 2014-11-28 NOTE — Assessment & Plan Note (Signed)
Her renal function is stable

## 2014-11-28 NOTE — Assessment & Plan Note (Signed)
Her volume status is improving Will cont the current meds

## 2014-11-28 NOTE — Assessment & Plan Note (Signed)
CBC Latest Ref Rng 11/25/2014 11/21/2014 11/20/2014  WBC 4.0 - 10.5 K/uL 14.9(H) 11.0(H) 15.8(H)  Hemoglobin 12.0 - 15.0 g/dL 9.8(L) 9.1(L) 8.8(L)  Hematocrit 36.0 - 46.0 % 30.3(L) 31.7(L) 31.2(L)  Platelets 150.0 - 400.0 K/uL 261.0 246 273   H and H have improved Her Vit levels are normal Will work up the blood in her stool with a GI referral

## 2014-11-28 NOTE — Assessment & Plan Note (Signed)
Improvement noted Will cont the current meds and O2

## 2014-11-29 DIAGNOSIS — N183 Chronic kidney disease, stage 3 (moderate): Secondary | ICD-10-CM | POA: Diagnosis not present

## 2014-11-29 DIAGNOSIS — E785 Hyperlipidemia, unspecified: Secondary | ICD-10-CM | POA: Diagnosis not present

## 2014-11-29 DIAGNOSIS — D638 Anemia in other chronic diseases classified elsewhere: Secondary | ICD-10-CM | POA: Diagnosis not present

## 2014-11-29 DIAGNOSIS — G4733 Obstructive sleep apnea (adult) (pediatric): Secondary | ICD-10-CM | POA: Diagnosis not present

## 2014-11-29 DIAGNOSIS — I5033 Acute on chronic diastolic (congestive) heart failure: Secondary | ICD-10-CM | POA: Diagnosis not present

## 2014-11-29 DIAGNOSIS — J449 Chronic obstructive pulmonary disease, unspecified: Secondary | ICD-10-CM | POA: Diagnosis not present

## 2014-11-29 DIAGNOSIS — I129 Hypertensive chronic kidney disease with stage 1 through stage 4 chronic kidney disease, or unspecified chronic kidney disease: Secondary | ICD-10-CM | POA: Diagnosis not present

## 2014-11-29 DIAGNOSIS — J211 Acute bronchiolitis due to human metapneumovirus: Secondary | ICD-10-CM | POA: Diagnosis not present

## 2014-11-29 DIAGNOSIS — M109 Gout, unspecified: Secondary | ICD-10-CM | POA: Diagnosis not present

## 2014-11-29 DIAGNOSIS — R600 Localized edema: Secondary | ICD-10-CM | POA: Diagnosis not present

## 2014-11-29 DIAGNOSIS — L03115 Cellulitis of right lower limb: Secondary | ICD-10-CM | POA: Diagnosis not present

## 2014-11-30 DIAGNOSIS — R600 Localized edema: Secondary | ICD-10-CM | POA: Diagnosis not present

## 2014-11-30 DIAGNOSIS — I129 Hypertensive chronic kidney disease with stage 1 through stage 4 chronic kidney disease, or unspecified chronic kidney disease: Secondary | ICD-10-CM | POA: Diagnosis not present

## 2014-11-30 DIAGNOSIS — I5033 Acute on chronic diastolic (congestive) heart failure: Secondary | ICD-10-CM | POA: Diagnosis not present

## 2014-11-30 DIAGNOSIS — J211 Acute bronchiolitis due to human metapneumovirus: Secondary | ICD-10-CM | POA: Diagnosis not present

## 2014-11-30 DIAGNOSIS — M109 Gout, unspecified: Secondary | ICD-10-CM | POA: Diagnosis not present

## 2014-11-30 DIAGNOSIS — G4733 Obstructive sleep apnea (adult) (pediatric): Secondary | ICD-10-CM | POA: Diagnosis not present

## 2014-11-30 DIAGNOSIS — E785 Hyperlipidemia, unspecified: Secondary | ICD-10-CM | POA: Diagnosis not present

## 2014-11-30 DIAGNOSIS — D638 Anemia in other chronic diseases classified elsewhere: Secondary | ICD-10-CM | POA: Diagnosis not present

## 2014-11-30 DIAGNOSIS — L03115 Cellulitis of right lower limb: Secondary | ICD-10-CM | POA: Diagnosis not present

## 2014-11-30 DIAGNOSIS — N183 Chronic kidney disease, stage 3 (moderate): Secondary | ICD-10-CM | POA: Diagnosis not present

## 2014-11-30 DIAGNOSIS — J449 Chronic obstructive pulmonary disease, unspecified: Secondary | ICD-10-CM | POA: Diagnosis not present

## 2014-12-01 DIAGNOSIS — R600 Localized edema: Secondary | ICD-10-CM | POA: Diagnosis not present

## 2014-12-01 DIAGNOSIS — I5033 Acute on chronic diastolic (congestive) heart failure: Secondary | ICD-10-CM | POA: Diagnosis not present

## 2014-12-01 DIAGNOSIS — E785 Hyperlipidemia, unspecified: Secondary | ICD-10-CM | POA: Diagnosis not present

## 2014-12-01 DIAGNOSIS — L03115 Cellulitis of right lower limb: Secondary | ICD-10-CM | POA: Diagnosis not present

## 2014-12-01 DIAGNOSIS — M109 Gout, unspecified: Secondary | ICD-10-CM | POA: Diagnosis not present

## 2014-12-01 DIAGNOSIS — J211 Acute bronchiolitis due to human metapneumovirus: Secondary | ICD-10-CM | POA: Diagnosis not present

## 2014-12-01 DIAGNOSIS — N183 Chronic kidney disease, stage 3 (moderate): Secondary | ICD-10-CM | POA: Diagnosis not present

## 2014-12-01 DIAGNOSIS — J449 Chronic obstructive pulmonary disease, unspecified: Secondary | ICD-10-CM | POA: Diagnosis not present

## 2014-12-01 DIAGNOSIS — G4733 Obstructive sleep apnea (adult) (pediatric): Secondary | ICD-10-CM | POA: Diagnosis not present

## 2014-12-01 DIAGNOSIS — D638 Anemia in other chronic diseases classified elsewhere: Secondary | ICD-10-CM | POA: Diagnosis not present

## 2014-12-01 DIAGNOSIS — I129 Hypertensive chronic kidney disease with stage 1 through stage 4 chronic kidney disease, or unspecified chronic kidney disease: Secondary | ICD-10-CM | POA: Diagnosis not present

## 2014-12-02 ENCOUNTER — Telehealth: Payer: Self-pay | Admitting: Internal Medicine

## 2014-12-02 DIAGNOSIS — J211 Acute bronchiolitis due to human metapneumovirus: Secondary | ICD-10-CM | POA: Diagnosis not present

## 2014-12-02 NOTE — Telephone Encounter (Signed)
It is related to her lung disease

## 2014-12-02 NOTE — Telephone Encounter (Signed)
pts daughter called regarding how much fatigue her mom is experiencing. She is wondering if her lab work may show something about why. She would like you to call patient to discuss her labs with her.

## 2014-12-02 NOTE — Telephone Encounter (Signed)
Notified pt with md response. Pt states she is feeling ok this am.../lmb

## 2014-12-03 ENCOUNTER — Telehealth: Payer: Self-pay | Admitting: Internal Medicine

## 2014-12-03 DIAGNOSIS — G4733 Obstructive sleep apnea (adult) (pediatric): Secondary | ICD-10-CM | POA: Diagnosis not present

## 2014-12-03 DIAGNOSIS — D638 Anemia in other chronic diseases classified elsewhere: Secondary | ICD-10-CM | POA: Diagnosis not present

## 2014-12-03 DIAGNOSIS — M109 Gout, unspecified: Secondary | ICD-10-CM | POA: Diagnosis not present

## 2014-12-03 DIAGNOSIS — J211 Acute bronchiolitis due to human metapneumovirus: Secondary | ICD-10-CM | POA: Diagnosis not present

## 2014-12-03 DIAGNOSIS — L03115 Cellulitis of right lower limb: Secondary | ICD-10-CM | POA: Diagnosis not present

## 2014-12-03 DIAGNOSIS — I87312 Chronic venous hypertension (idiopathic) with ulcer of left lower extremity: Secondary | ICD-10-CM | POA: Diagnosis not present

## 2014-12-03 DIAGNOSIS — J449 Chronic obstructive pulmonary disease, unspecified: Secondary | ICD-10-CM | POA: Diagnosis not present

## 2014-12-03 DIAGNOSIS — I5033 Acute on chronic diastolic (congestive) heart failure: Secondary | ICD-10-CM | POA: Diagnosis not present

## 2014-12-03 DIAGNOSIS — N183 Chronic kidney disease, stage 3 (moderate): Secondary | ICD-10-CM | POA: Diagnosis not present

## 2014-12-03 DIAGNOSIS — E785 Hyperlipidemia, unspecified: Secondary | ICD-10-CM | POA: Diagnosis not present

## 2014-12-03 DIAGNOSIS — R600 Localized edema: Secondary | ICD-10-CM | POA: Diagnosis not present

## 2014-12-03 DIAGNOSIS — I129 Hypertensive chronic kidney disease with stage 1 through stage 4 chronic kidney disease, or unspecified chronic kidney disease: Secondary | ICD-10-CM | POA: Diagnosis not present

## 2014-12-03 DIAGNOSIS — I87311 Chronic venous hypertension (idiopathic) with ulcer of right lower extremity: Secondary | ICD-10-CM | POA: Diagnosis not present

## 2014-12-03 NOTE — Telephone Encounter (Signed)
[name] advacned home care [phone]  She said that they need to change order from change wound care from 2 twice a week to once a week.

## 2014-12-06 DIAGNOSIS — J211 Acute bronchiolitis due to human metapneumovirus: Secondary | ICD-10-CM | POA: Diagnosis not present

## 2014-12-06 DIAGNOSIS — I129 Hypertensive chronic kidney disease with stage 1 through stage 4 chronic kidney disease, or unspecified chronic kidney disease: Secondary | ICD-10-CM | POA: Diagnosis not present

## 2014-12-06 DIAGNOSIS — L03115 Cellulitis of right lower limb: Secondary | ICD-10-CM | POA: Diagnosis not present

## 2014-12-06 DIAGNOSIS — J210 Acute bronchiolitis due to respiratory syncytial virus: Secondary | ICD-10-CM | POA: Diagnosis not present

## 2014-12-06 DIAGNOSIS — D638 Anemia in other chronic diseases classified elsewhere: Secondary | ICD-10-CM | POA: Diagnosis not present

## 2014-12-06 DIAGNOSIS — I5033 Acute on chronic diastolic (congestive) heart failure: Secondary | ICD-10-CM | POA: Diagnosis not present

## 2014-12-06 DIAGNOSIS — G4733 Obstructive sleep apnea (adult) (pediatric): Secondary | ICD-10-CM | POA: Diagnosis not present

## 2014-12-06 DIAGNOSIS — R600 Localized edema: Secondary | ICD-10-CM | POA: Diagnosis not present

## 2014-12-06 DIAGNOSIS — J449 Chronic obstructive pulmonary disease, unspecified: Secondary | ICD-10-CM | POA: Diagnosis not present

## 2014-12-06 DIAGNOSIS — N183 Chronic kidney disease, stage 3 (moderate): Secondary | ICD-10-CM | POA: Diagnosis not present

## 2014-12-06 DIAGNOSIS — E785 Hyperlipidemia, unspecified: Secondary | ICD-10-CM | POA: Diagnosis not present

## 2014-12-06 DIAGNOSIS — M109 Gout, unspecified: Secondary | ICD-10-CM | POA: Diagnosis not present

## 2014-12-06 NOTE — Telephone Encounter (Signed)
Returned call to [place] and gave verbal ok.

## 2014-12-07 ENCOUNTER — Telehealth: Payer: Self-pay | Admitting: Internal Medicine

## 2014-12-07 DIAGNOSIS — J449 Chronic obstructive pulmonary disease, unspecified: Secondary | ICD-10-CM | POA: Diagnosis not present

## 2014-12-07 DIAGNOSIS — I5033 Acute on chronic diastolic (congestive) heart failure: Secondary | ICD-10-CM | POA: Diagnosis not present

## 2014-12-07 DIAGNOSIS — J211 Acute bronchiolitis due to human metapneumovirus: Secondary | ICD-10-CM | POA: Diagnosis not present

## 2014-12-07 DIAGNOSIS — L03115 Cellulitis of right lower limb: Secondary | ICD-10-CM | POA: Diagnosis not present

## 2014-12-07 DIAGNOSIS — I129 Hypertensive chronic kidney disease with stage 1 through stage 4 chronic kidney disease, or unspecified chronic kidney disease: Secondary | ICD-10-CM | POA: Diagnosis not present

## 2014-12-07 DIAGNOSIS — E785 Hyperlipidemia, unspecified: Secondary | ICD-10-CM | POA: Diagnosis not present

## 2014-12-07 DIAGNOSIS — N183 Chronic kidney disease, stage 3 (moderate): Secondary | ICD-10-CM | POA: Diagnosis not present

## 2014-12-07 DIAGNOSIS — M109 Gout, unspecified: Secondary | ICD-10-CM | POA: Diagnosis not present

## 2014-12-07 DIAGNOSIS — G4733 Obstructive sleep apnea (adult) (pediatric): Secondary | ICD-10-CM | POA: Diagnosis not present

## 2014-12-07 DIAGNOSIS — D638 Anemia in other chronic diseases classified elsewhere: Secondary | ICD-10-CM | POA: Diagnosis not present

## 2014-12-07 DIAGNOSIS — R600 Localized edema: Secondary | ICD-10-CM | POA: Diagnosis not present

## 2014-12-07 MED ORDER — NYSTATIN 100000 UNIT/GM EX POWD: CUTANEOUS | Status: DC

## 2014-12-07 NOTE — Telephone Encounter (Signed)
Patient has a rash under breast.  States it is red and inflamed and does has moisture.  Believes to be yeast.  Is requesting an antifungal cream.

## 2014-12-07 NOTE — Telephone Encounter (Signed)
Called nurse "Delena Serve" back no answer LMOM md sent nystatin to pharmacy...Johny Chess

## 2014-12-07 NOTE — Telephone Encounter (Signed)
Nystatin powder done erx

## 2014-12-08 ENCOUNTER — Telehealth: Payer: Self-pay | Admitting: Internal Medicine

## 2014-12-08 DIAGNOSIS — D638 Anemia in other chronic diseases classified elsewhere: Secondary | ICD-10-CM | POA: Diagnosis not present

## 2014-12-08 DIAGNOSIS — M109 Gout, unspecified: Secondary | ICD-10-CM | POA: Diagnosis not present

## 2014-12-08 DIAGNOSIS — I5033 Acute on chronic diastolic (congestive) heart failure: Secondary | ICD-10-CM | POA: Diagnosis not present

## 2014-12-08 DIAGNOSIS — R600 Localized edema: Secondary | ICD-10-CM | POA: Diagnosis not present

## 2014-12-08 DIAGNOSIS — L03115 Cellulitis of right lower limb: Secondary | ICD-10-CM | POA: Diagnosis not present

## 2014-12-08 DIAGNOSIS — J211 Acute bronchiolitis due to human metapneumovirus: Secondary | ICD-10-CM | POA: Diagnosis not present

## 2014-12-08 DIAGNOSIS — G4733 Obstructive sleep apnea (adult) (pediatric): Secondary | ICD-10-CM | POA: Diagnosis not present

## 2014-12-08 DIAGNOSIS — N183 Chronic kidney disease, stage 3 (moderate): Secondary | ICD-10-CM | POA: Diagnosis not present

## 2014-12-08 DIAGNOSIS — I129 Hypertensive chronic kidney disease with stage 1 through stage 4 chronic kidney disease, or unspecified chronic kidney disease: Secondary | ICD-10-CM | POA: Diagnosis not present

## 2014-12-08 DIAGNOSIS — E785 Hyperlipidemia, unspecified: Secondary | ICD-10-CM | POA: Diagnosis not present

## 2014-12-08 DIAGNOSIS — J449 Chronic obstructive pulmonary disease, unspecified: Secondary | ICD-10-CM | POA: Diagnosis not present

## 2014-12-08 NOTE — Telephone Encounter (Signed)
Requesting verbal orders to continue physical therapy.

## 2014-12-08 NOTE — Telephone Encounter (Signed)
LMOVM giving verbal ok.

## 2014-12-10 DIAGNOSIS — J449 Chronic obstructive pulmonary disease, unspecified: Secondary | ICD-10-CM | POA: Diagnosis not present

## 2014-12-10 DIAGNOSIS — E785 Hyperlipidemia, unspecified: Secondary | ICD-10-CM | POA: Diagnosis not present

## 2014-12-10 DIAGNOSIS — L03115 Cellulitis of right lower limb: Secondary | ICD-10-CM | POA: Diagnosis not present

## 2014-12-10 DIAGNOSIS — I5033 Acute on chronic diastolic (congestive) heart failure: Secondary | ICD-10-CM | POA: Diagnosis not present

## 2014-12-10 DIAGNOSIS — R600 Localized edema: Secondary | ICD-10-CM | POA: Diagnosis not present

## 2014-12-10 DIAGNOSIS — Z0279 Encounter for issue of other medical certificate: Secondary | ICD-10-CM

## 2014-12-10 DIAGNOSIS — G4733 Obstructive sleep apnea (adult) (pediatric): Secondary | ICD-10-CM | POA: Diagnosis not present

## 2014-12-10 DIAGNOSIS — J211 Acute bronchiolitis due to human metapneumovirus: Secondary | ICD-10-CM | POA: Diagnosis not present

## 2014-12-10 DIAGNOSIS — N183 Chronic kidney disease, stage 3 (moderate): Secondary | ICD-10-CM | POA: Diagnosis not present

## 2014-12-10 DIAGNOSIS — D638 Anemia in other chronic diseases classified elsewhere: Secondary | ICD-10-CM | POA: Diagnosis not present

## 2014-12-10 DIAGNOSIS — I129 Hypertensive chronic kidney disease with stage 1 through stage 4 chronic kidney disease, or unspecified chronic kidney disease: Secondary | ICD-10-CM | POA: Diagnosis not present

## 2014-12-10 DIAGNOSIS — M109 Gout, unspecified: Secondary | ICD-10-CM | POA: Diagnosis not present

## 2014-12-13 DIAGNOSIS — J211 Acute bronchiolitis due to human metapneumovirus: Secondary | ICD-10-CM | POA: Diagnosis not present

## 2014-12-13 DIAGNOSIS — L03115 Cellulitis of right lower limb: Secondary | ICD-10-CM | POA: Diagnosis not present

## 2014-12-13 DIAGNOSIS — D638 Anemia in other chronic diseases classified elsewhere: Secondary | ICD-10-CM | POA: Diagnosis not present

## 2014-12-13 DIAGNOSIS — N183 Chronic kidney disease, stage 3 (moderate): Secondary | ICD-10-CM | POA: Diagnosis not present

## 2014-12-13 DIAGNOSIS — I5033 Acute on chronic diastolic (congestive) heart failure: Secondary | ICD-10-CM | POA: Diagnosis not present

## 2014-12-13 DIAGNOSIS — M109 Gout, unspecified: Secondary | ICD-10-CM | POA: Diagnosis not present

## 2014-12-13 DIAGNOSIS — R600 Localized edema: Secondary | ICD-10-CM | POA: Diagnosis not present

## 2014-12-13 DIAGNOSIS — J449 Chronic obstructive pulmonary disease, unspecified: Secondary | ICD-10-CM | POA: Diagnosis not present

## 2014-12-13 DIAGNOSIS — I129 Hypertensive chronic kidney disease with stage 1 through stage 4 chronic kidney disease, or unspecified chronic kidney disease: Secondary | ICD-10-CM | POA: Diagnosis not present

## 2014-12-13 DIAGNOSIS — G4733 Obstructive sleep apnea (adult) (pediatric): Secondary | ICD-10-CM | POA: Diagnosis not present

## 2014-12-13 DIAGNOSIS — E785 Hyperlipidemia, unspecified: Secondary | ICD-10-CM | POA: Diagnosis not present

## 2014-12-15 DIAGNOSIS — G4733 Obstructive sleep apnea (adult) (pediatric): Secondary | ICD-10-CM | POA: Diagnosis not present

## 2014-12-15 DIAGNOSIS — L03115 Cellulitis of right lower limb: Secondary | ICD-10-CM | POA: Diagnosis not present

## 2014-12-15 DIAGNOSIS — I129 Hypertensive chronic kidney disease with stage 1 through stage 4 chronic kidney disease, or unspecified chronic kidney disease: Secondary | ICD-10-CM | POA: Diagnosis not present

## 2014-12-15 DIAGNOSIS — D638 Anemia in other chronic diseases classified elsewhere: Secondary | ICD-10-CM | POA: Diagnosis not present

## 2014-12-15 DIAGNOSIS — I5033 Acute on chronic diastolic (congestive) heart failure: Secondary | ICD-10-CM | POA: Diagnosis not present

## 2014-12-15 DIAGNOSIS — M109 Gout, unspecified: Secondary | ICD-10-CM | POA: Diagnosis not present

## 2014-12-15 DIAGNOSIS — R600 Localized edema: Secondary | ICD-10-CM | POA: Diagnosis not present

## 2014-12-15 DIAGNOSIS — J211 Acute bronchiolitis due to human metapneumovirus: Secondary | ICD-10-CM | POA: Diagnosis not present

## 2014-12-15 DIAGNOSIS — N183 Chronic kidney disease, stage 3 (moderate): Secondary | ICD-10-CM | POA: Diagnosis not present

## 2014-12-15 DIAGNOSIS — J449 Chronic obstructive pulmonary disease, unspecified: Secondary | ICD-10-CM | POA: Diagnosis not present

## 2014-12-15 DIAGNOSIS — E785 Hyperlipidemia, unspecified: Secondary | ICD-10-CM | POA: Diagnosis not present

## 2014-12-16 DIAGNOSIS — J449 Chronic obstructive pulmonary disease, unspecified: Secondary | ICD-10-CM | POA: Diagnosis not present

## 2014-12-16 DIAGNOSIS — G4733 Obstructive sleep apnea (adult) (pediatric): Secondary | ICD-10-CM | POA: Diagnosis not present

## 2014-12-16 DIAGNOSIS — I5033 Acute on chronic diastolic (congestive) heart failure: Secondary | ICD-10-CM | POA: Diagnosis not present

## 2014-12-16 DIAGNOSIS — R600 Localized edema: Secondary | ICD-10-CM | POA: Diagnosis not present

## 2014-12-16 DIAGNOSIS — M109 Gout, unspecified: Secondary | ICD-10-CM | POA: Diagnosis not present

## 2014-12-16 DIAGNOSIS — N183 Chronic kidney disease, stage 3 (moderate): Secondary | ICD-10-CM | POA: Diagnosis not present

## 2014-12-16 DIAGNOSIS — L03115 Cellulitis of right lower limb: Secondary | ICD-10-CM | POA: Diagnosis not present

## 2014-12-16 DIAGNOSIS — E785 Hyperlipidemia, unspecified: Secondary | ICD-10-CM | POA: Diagnosis not present

## 2014-12-16 DIAGNOSIS — J211 Acute bronchiolitis due to human metapneumovirus: Secondary | ICD-10-CM | POA: Diagnosis not present

## 2014-12-16 DIAGNOSIS — D638 Anemia in other chronic diseases classified elsewhere: Secondary | ICD-10-CM | POA: Diagnosis not present

## 2014-12-16 DIAGNOSIS — I129 Hypertensive chronic kidney disease with stage 1 through stage 4 chronic kidney disease, or unspecified chronic kidney disease: Secondary | ICD-10-CM | POA: Diagnosis not present

## 2014-12-20 DIAGNOSIS — M109 Gout, unspecified: Secondary | ICD-10-CM | POA: Diagnosis not present

## 2014-12-20 DIAGNOSIS — J449 Chronic obstructive pulmonary disease, unspecified: Secondary | ICD-10-CM | POA: Diagnosis not present

## 2014-12-20 DIAGNOSIS — I87311 Chronic venous hypertension (idiopathic) with ulcer of right lower extremity: Secondary | ICD-10-CM | POA: Diagnosis not present

## 2014-12-20 DIAGNOSIS — I129 Hypertensive chronic kidney disease with stage 1 through stage 4 chronic kidney disease, or unspecified chronic kidney disease: Secondary | ICD-10-CM | POA: Diagnosis not present

## 2014-12-20 DIAGNOSIS — I87312 Chronic venous hypertension (idiopathic) with ulcer of left lower extremity: Secondary | ICD-10-CM | POA: Diagnosis not present

## 2014-12-20 DIAGNOSIS — N183 Chronic kidney disease, stage 3 (moderate): Secondary | ICD-10-CM | POA: Diagnosis not present

## 2014-12-20 DIAGNOSIS — G4733 Obstructive sleep apnea (adult) (pediatric): Secondary | ICD-10-CM | POA: Diagnosis not present

## 2014-12-20 DIAGNOSIS — L03115 Cellulitis of right lower limb: Secondary | ICD-10-CM | POA: Diagnosis not present

## 2014-12-20 DIAGNOSIS — I5033 Acute on chronic diastolic (congestive) heart failure: Secondary | ICD-10-CM | POA: Diagnosis not present

## 2014-12-20 DIAGNOSIS — J211 Acute bronchiolitis due to human metapneumovirus: Secondary | ICD-10-CM | POA: Diagnosis not present

## 2014-12-20 DIAGNOSIS — R600 Localized edema: Secondary | ICD-10-CM | POA: Diagnosis not present

## 2014-12-20 DIAGNOSIS — E785 Hyperlipidemia, unspecified: Secondary | ICD-10-CM | POA: Diagnosis not present

## 2014-12-20 DIAGNOSIS — D638 Anemia in other chronic diseases classified elsewhere: Secondary | ICD-10-CM | POA: Diagnosis not present

## 2014-12-22 DIAGNOSIS — E785 Hyperlipidemia, unspecified: Secondary | ICD-10-CM | POA: Diagnosis not present

## 2014-12-22 DIAGNOSIS — D638 Anemia in other chronic diseases classified elsewhere: Secondary | ICD-10-CM | POA: Diagnosis not present

## 2014-12-22 DIAGNOSIS — I129 Hypertensive chronic kidney disease with stage 1 through stage 4 chronic kidney disease, or unspecified chronic kidney disease: Secondary | ICD-10-CM | POA: Diagnosis not present

## 2014-12-22 DIAGNOSIS — J449 Chronic obstructive pulmonary disease, unspecified: Secondary | ICD-10-CM | POA: Diagnosis not present

## 2014-12-22 DIAGNOSIS — G4733 Obstructive sleep apnea (adult) (pediatric): Secondary | ICD-10-CM | POA: Diagnosis not present

## 2014-12-22 DIAGNOSIS — M109 Gout, unspecified: Secondary | ICD-10-CM | POA: Diagnosis not present

## 2014-12-22 DIAGNOSIS — I5033 Acute on chronic diastolic (congestive) heart failure: Secondary | ICD-10-CM | POA: Diagnosis not present

## 2014-12-22 DIAGNOSIS — N183 Chronic kidney disease, stage 3 (moderate): Secondary | ICD-10-CM | POA: Diagnosis not present

## 2014-12-22 DIAGNOSIS — R600 Localized edema: Secondary | ICD-10-CM | POA: Diagnosis not present

## 2014-12-22 DIAGNOSIS — L03115 Cellulitis of right lower limb: Secondary | ICD-10-CM | POA: Diagnosis not present

## 2014-12-22 DIAGNOSIS — J211 Acute bronchiolitis due to human metapneumovirus: Secondary | ICD-10-CM | POA: Diagnosis not present

## 2014-12-23 DIAGNOSIS — J449 Chronic obstructive pulmonary disease, unspecified: Secondary | ICD-10-CM | POA: Diagnosis not present

## 2014-12-24 DIAGNOSIS — I129 Hypertensive chronic kidney disease with stage 1 through stage 4 chronic kidney disease, or unspecified chronic kidney disease: Secondary | ICD-10-CM | POA: Diagnosis not present

## 2014-12-24 DIAGNOSIS — D638 Anemia in other chronic diseases classified elsewhere: Secondary | ICD-10-CM | POA: Diagnosis not present

## 2014-12-24 DIAGNOSIS — R600 Localized edema: Secondary | ICD-10-CM | POA: Diagnosis not present

## 2014-12-24 DIAGNOSIS — L03115 Cellulitis of right lower limb: Secondary | ICD-10-CM | POA: Diagnosis not present

## 2014-12-24 DIAGNOSIS — I5033 Acute on chronic diastolic (congestive) heart failure: Secondary | ICD-10-CM | POA: Diagnosis not present

## 2014-12-24 DIAGNOSIS — G4733 Obstructive sleep apnea (adult) (pediatric): Secondary | ICD-10-CM | POA: Diagnosis not present

## 2014-12-24 DIAGNOSIS — J211 Acute bronchiolitis due to human metapneumovirus: Secondary | ICD-10-CM | POA: Diagnosis not present

## 2014-12-24 DIAGNOSIS — E785 Hyperlipidemia, unspecified: Secondary | ICD-10-CM | POA: Diagnosis not present

## 2014-12-24 DIAGNOSIS — N183 Chronic kidney disease, stage 3 (moderate): Secondary | ICD-10-CM | POA: Diagnosis not present

## 2014-12-24 DIAGNOSIS — M109 Gout, unspecified: Secondary | ICD-10-CM | POA: Diagnosis not present

## 2014-12-24 DIAGNOSIS — J449 Chronic obstructive pulmonary disease, unspecified: Secondary | ICD-10-CM | POA: Diagnosis not present

## 2014-12-30 DIAGNOSIS — J449 Chronic obstructive pulmonary disease, unspecified: Secondary | ICD-10-CM | POA: Diagnosis not present

## 2014-12-30 DIAGNOSIS — I5033 Acute on chronic diastolic (congestive) heart failure: Secondary | ICD-10-CM | POA: Diagnosis not present

## 2014-12-30 DIAGNOSIS — J211 Acute bronchiolitis due to human metapneumovirus: Secondary | ICD-10-CM | POA: Diagnosis not present

## 2014-12-30 DIAGNOSIS — L03115 Cellulitis of right lower limb: Secondary | ICD-10-CM | POA: Diagnosis not present

## 2014-12-30 DIAGNOSIS — E785 Hyperlipidemia, unspecified: Secondary | ICD-10-CM | POA: Diagnosis not present

## 2014-12-30 DIAGNOSIS — N183 Chronic kidney disease, stage 3 (moderate): Secondary | ICD-10-CM | POA: Diagnosis not present

## 2014-12-30 DIAGNOSIS — M109 Gout, unspecified: Secondary | ICD-10-CM | POA: Diagnosis not present

## 2014-12-30 DIAGNOSIS — D638 Anemia in other chronic diseases classified elsewhere: Secondary | ICD-10-CM | POA: Diagnosis not present

## 2014-12-30 DIAGNOSIS — R600 Localized edema: Secondary | ICD-10-CM | POA: Diagnosis not present

## 2014-12-30 DIAGNOSIS — I129 Hypertensive chronic kidney disease with stage 1 through stage 4 chronic kidney disease, or unspecified chronic kidney disease: Secondary | ICD-10-CM | POA: Diagnosis not present

## 2014-12-30 DIAGNOSIS — G4733 Obstructive sleep apnea (adult) (pediatric): Secondary | ICD-10-CM | POA: Diagnosis not present

## 2014-12-31 ENCOUNTER — Telehealth: Payer: Self-pay | Admitting: Internal Medicine

## 2014-12-31 DIAGNOSIS — J441 Chronic obstructive pulmonary disease with (acute) exacerbation: Secondary | ICD-10-CM

## 2014-12-31 NOTE — Telephone Encounter (Signed)
Patient is needing a script for albuterol sulfate solution  2.5 mg.  Pharmacy is [facility] on [address]

## 2014-12-31 NOTE — Telephone Encounter (Signed)
Per Epic, this medication was discontinued 11/25/13 by Dr. Deniece Ree stating no longer needed. Will have to ask MD when he returns.

## 2015-01-02 MED ORDER — ALBUTEROL SULFATE (2.5 MG/3ML) 0.083% IN NEBU: 2.5000 mg | INHALATION_SOLUTION | Freq: Four times a day (QID) | RESPIRATORY_TRACT | Status: DC | PRN

## 2015-01-02 NOTE — Telephone Encounter (Signed)
RX was sent to her pharmacy

## 2015-01-05 DIAGNOSIS — I87312 Chronic venous hypertension (idiopathic) with ulcer of left lower extremity: Secondary | ICD-10-CM | POA: Diagnosis not present

## 2015-01-05 DIAGNOSIS — I87311 Chronic venous hypertension (idiopathic) with ulcer of right lower extremity: Secondary | ICD-10-CM | POA: Diagnosis not present

## 2015-01-08 DIAGNOSIS — L03115 Cellulitis of right lower limb: Secondary | ICD-10-CM | POA: Diagnosis not present

## 2015-01-08 DIAGNOSIS — J449 Chronic obstructive pulmonary disease, unspecified: Secondary | ICD-10-CM | POA: Diagnosis not present

## 2015-01-08 DIAGNOSIS — I129 Hypertensive chronic kidney disease with stage 1 through stage 4 chronic kidney disease, or unspecified chronic kidney disease: Secondary | ICD-10-CM | POA: Diagnosis not present

## 2015-01-08 DIAGNOSIS — J211 Acute bronchiolitis due to human metapneumovirus: Secondary | ICD-10-CM | POA: Diagnosis not present

## 2015-01-08 DIAGNOSIS — N183 Chronic kidney disease, stage 3 (moderate): Secondary | ICD-10-CM | POA: Diagnosis not present

## 2015-01-08 DIAGNOSIS — E785 Hyperlipidemia, unspecified: Secondary | ICD-10-CM | POA: Diagnosis not present

## 2015-01-08 DIAGNOSIS — G4733 Obstructive sleep apnea (adult) (pediatric): Secondary | ICD-10-CM | POA: Diagnosis not present

## 2015-01-08 DIAGNOSIS — R600 Localized edema: Secondary | ICD-10-CM | POA: Diagnosis not present

## 2015-01-08 DIAGNOSIS — I5033 Acute on chronic diastolic (congestive) heart failure: Secondary | ICD-10-CM | POA: Diagnosis not present

## 2015-01-08 DIAGNOSIS — D638 Anemia in other chronic diseases classified elsewhere: Secondary | ICD-10-CM | POA: Diagnosis not present

## 2015-01-08 DIAGNOSIS — M109 Gout, unspecified: Secondary | ICD-10-CM | POA: Diagnosis not present

## 2015-01-14 DIAGNOSIS — I5033 Acute on chronic diastolic (congestive) heart failure: Secondary | ICD-10-CM | POA: Diagnosis not present

## 2015-01-14 DIAGNOSIS — L03115 Cellulitis of right lower limb: Secondary | ICD-10-CM | POA: Diagnosis not present

## 2015-01-14 DIAGNOSIS — E785 Hyperlipidemia, unspecified: Secondary | ICD-10-CM | POA: Diagnosis not present

## 2015-01-14 DIAGNOSIS — J449 Chronic obstructive pulmonary disease, unspecified: Secondary | ICD-10-CM | POA: Diagnosis not present

## 2015-01-14 DIAGNOSIS — M109 Gout, unspecified: Secondary | ICD-10-CM | POA: Diagnosis not present

## 2015-01-14 DIAGNOSIS — G4733 Obstructive sleep apnea (adult) (pediatric): Secondary | ICD-10-CM | POA: Diagnosis not present

## 2015-01-14 DIAGNOSIS — N183 Chronic kidney disease, stage 3 (moderate): Secondary | ICD-10-CM | POA: Diagnosis not present

## 2015-01-14 DIAGNOSIS — J211 Acute bronchiolitis due to human metapneumovirus: Secondary | ICD-10-CM | POA: Diagnosis not present

## 2015-01-14 DIAGNOSIS — R600 Localized edema: Secondary | ICD-10-CM | POA: Diagnosis not present

## 2015-01-14 DIAGNOSIS — D638 Anemia in other chronic diseases classified elsewhere: Secondary | ICD-10-CM | POA: Diagnosis not present

## 2015-01-14 DIAGNOSIS — I129 Hypertensive chronic kidney disease with stage 1 through stage 4 chronic kidney disease, or unspecified chronic kidney disease: Secondary | ICD-10-CM | POA: Diagnosis not present

## 2015-01-20 ENCOUNTER — Other Ambulatory Visit: Payer: Self-pay | Admitting: Internal Medicine

## 2015-01-20 DIAGNOSIS — I87311 Chronic venous hypertension (idiopathic) with ulcer of right lower extremity: Secondary | ICD-10-CM | POA: Diagnosis not present

## 2015-01-20 DIAGNOSIS — J211 Acute bronchiolitis due to human metapneumovirus: Secondary | ICD-10-CM | POA: Diagnosis not present

## 2015-01-20 DIAGNOSIS — I87312 Chronic venous hypertension (idiopathic) with ulcer of left lower extremity: Secondary | ICD-10-CM | POA: Diagnosis not present

## 2015-01-21 DIAGNOSIS — L03115 Cellulitis of right lower limb: Secondary | ICD-10-CM | POA: Diagnosis not present

## 2015-01-21 DIAGNOSIS — M109 Gout, unspecified: Secondary | ICD-10-CM | POA: Diagnosis not present

## 2015-01-21 DIAGNOSIS — I5033 Acute on chronic diastolic (congestive) heart failure: Secondary | ICD-10-CM | POA: Diagnosis not present

## 2015-01-21 DIAGNOSIS — J449 Chronic obstructive pulmonary disease, unspecified: Secondary | ICD-10-CM | POA: Diagnosis not present

## 2015-01-21 DIAGNOSIS — I129 Hypertensive chronic kidney disease with stage 1 through stage 4 chronic kidney disease, or unspecified chronic kidney disease: Secondary | ICD-10-CM | POA: Diagnosis not present

## 2015-01-21 DIAGNOSIS — E785 Hyperlipidemia, unspecified: Secondary | ICD-10-CM | POA: Diagnosis not present

## 2015-01-21 DIAGNOSIS — D638 Anemia in other chronic diseases classified elsewhere: Secondary | ICD-10-CM | POA: Diagnosis not present

## 2015-01-21 DIAGNOSIS — N183 Chronic kidney disease, stage 3 (moderate): Secondary | ICD-10-CM | POA: Diagnosis not present

## 2015-01-21 DIAGNOSIS — J211 Acute bronchiolitis due to human metapneumovirus: Secondary | ICD-10-CM | POA: Diagnosis not present

## 2015-01-21 DIAGNOSIS — R600 Localized edema: Secondary | ICD-10-CM | POA: Diagnosis not present

## 2015-01-21 DIAGNOSIS — G4733 Obstructive sleep apnea (adult) (pediatric): Secondary | ICD-10-CM | POA: Diagnosis not present

## 2015-01-23 ENCOUNTER — Other Ambulatory Visit: Payer: Self-pay | Admitting: Internal Medicine

## 2015-01-23 DIAGNOSIS — J449 Chronic obstructive pulmonary disease, unspecified: Secondary | ICD-10-CM | POA: Diagnosis not present

## 2015-01-26 ENCOUNTER — Encounter: Payer: Self-pay | Admitting: Internal Medicine

## 2015-01-26 ENCOUNTER — Other Ambulatory Visit (INDEPENDENT_AMBULATORY_CARE_PROVIDER_SITE_OTHER): Payer: Medicare Other

## 2015-01-26 ENCOUNTER — Ambulatory Visit (INDEPENDENT_AMBULATORY_CARE_PROVIDER_SITE_OTHER): Payer: Medicare Other | Admitting: Internal Medicine

## 2015-01-26 VITALS — BP 130/70 | HR 59 | Temp 97.9°F | Resp 20 | Ht 61.0 in | Wt 226.5 lb

## 2015-01-26 DIAGNOSIS — J449 Chronic obstructive pulmonary disease, unspecified: Secondary | ICD-10-CM

## 2015-01-26 DIAGNOSIS — K921 Melena: Secondary | ICD-10-CM

## 2015-01-26 DIAGNOSIS — M1000 Idiopathic gout, unspecified site: Secondary | ICD-10-CM | POA: Diagnosis not present

## 2015-01-26 DIAGNOSIS — D510 Vitamin B12 deficiency anemia due to intrinsic factor deficiency: Secondary | ICD-10-CM | POA: Diagnosis not present

## 2015-01-26 DIAGNOSIS — I10 Essential (primary) hypertension: Secondary | ICD-10-CM | POA: Diagnosis not present

## 2015-01-26 DIAGNOSIS — K5150 Left sided colitis without complications: Secondary | ICD-10-CM | POA: Diagnosis not present

## 2015-01-26 LAB — BASIC METABOLIC PANEL
BUN: 44 mg/dL — ABNORMAL HIGH (ref 6–23)
CHLORIDE: 99 meq/L (ref 96–112)
CO2: 33 meq/L — AB (ref 19–32)
Calcium: 9.8 mg/dL (ref 8.4–10.5)
Creatinine, Ser: 1.47 mg/dL — ABNORMAL HIGH (ref 0.40–1.20)
GFR: 36.88 mL/min — ABNORMAL LOW (ref >60.00)
GLUCOSE: 111 mg/dL — AB (ref 70–99)
POTASSIUM: 4.2 meq/L (ref 3.5–5.1)
SODIUM: 140 meq/L (ref 135–145)

## 2015-01-26 LAB — CBC WITH DIFFERENTIAL/PLATELET
BASOS PCT: 0.3 % (ref 0.0–3.0)
Basophils Absolute: 0 10*3/uL (ref 0.0–0.1)
Eosinophils Absolute: 0.4 10*3/uL (ref 0.0–0.7)
Eosinophils Relative: 3.1 % (ref 0.0–5.0)
HEMATOCRIT: 26.3 % — AB (ref 36.0–46.0)
Hemoglobin: 8.5 g/dL — ABNORMAL LOW (ref 12.0–15.0)
Lymphocytes Relative: 16.7 % (ref 12.0–46.0)
Lymphs Abs: 2.1 10*3/uL (ref 0.7–4.0)
MCHC: 32.8 g/dL (ref 30.0–36.0)
MCV: 75.5 fl — AB (ref 78.0–100.0)
MONO ABS: 1.1 10*3/uL — AB (ref 0.1–1.0)
Monocytes Relative: 8.8 % (ref 3.0–12.0)
Neutro Abs: 9 10*3/uL — ABNORMAL HIGH (ref 1.4–7.7)
Neutrophils Relative %: 71.1 % (ref 43.0–77.0)
PLATELETS: 310 10*3/uL (ref 150.0–400.0)
RBC: 3.44 Mil/uL — ABNORMAL LOW (ref 3.87–5.11)
RDW: 18.8 % — ABNORMAL HIGH (ref 11.5–15.5)
WBC: 12.6 10*3/uL — AB (ref 4.0–10.5)

## 2015-01-26 MED ORDER — COLCHICINE 0.6 MG PO TABS: 0.6000 mg | ORAL_TABLET | Freq: Two times a day (BID) | ORAL | Status: DC

## 2015-01-26 MED ORDER — FLUTICASONE FUROATE-VILANTEROL 100-25 MCG/INH IN AEPB: 1.0000 | INHALATION_SPRAY | Freq: Every day | RESPIRATORY_TRACT | Status: DC

## 2015-01-26 NOTE — Progress Notes (Signed)
Subjective:  Patient ID: [name], female    DOB: 12-14-1940  Age: 74 y.o. MRN: [id number]  CC: Anemia; Hypertension; and COPD   HPI [name] presents for follow up on anemia - she continues to have intermittent episodes of abd cramping, loose stools, bloody and sometimes black stools. She also complains of a 1 week hx of pain and swelling in her left index finger.  Outpatient Prescriptions Prior to Visit  Medication Sig Dispense Refill  . acetaminophen (TYLENOL) 325 MG tablet Take 2 tablets (650 mg total) by mouth every 6 (six) hours as needed for mild pain or headache (or Fever >/= 101).    Marland Kitchen albuterol (PROAIR HFA) 108 (90 BASE) MCG/ACT inhaler Inhale 1-2 puffs into the lungs every 6 (six) hours as needed for wheezing or shortness of breath. (Patient taking differently: Inhale 1-2 puffs into the lungs every 6 (six) hours as needed for wheezing or shortness of breath. Patient take once a day instead of three times a day.) 18 g 11  . albuterol (PROVENTIL) (2.5 MG/3ML) 0.083% nebulizer solution Take 3 mLs (2.5 mg total) by nebulization every 6 (six) hours as needed for wheezing or shortness of breath. (Patient taking differently: Take 2.5 mg by nebulization every 6 (six) hours as needed for wheezing or shortness of breath. Patient only takes once a day instead of three times a day) 150 mL 11  . allopurinol (ZYLOPRIM) 100 MG tablet TAKE 1 TABLET BY MOUTH DAILY FOR GOUT (Patient taking differently: 1 tablet by mouth daily for gout.) 90 tablet 1  . aspirin 81 MG EC tablet Take 81 mg by mouth daily.      . Dietary Management Product (VASCULERA) TABS TAKE 1 TABLET BY MOUTH EVERY DAY. (Patient taking differently: Take 1 tablet by mouth every day.) 30 tablet 11  . feeding supplement, ENSURE ENLIVE, (ENSURE ENLIVE) LIQD Take 237 mLs by mouth 2 (two) times daily between meals. 237 mL 12  . furosemide (LASIX) 40 MG tablet TAKE 1 TABLET BY MOUTH TWICE DAILY. 180 tablet 1  . ketoconazole  (NIZORAL) 2 % cream Apply 1 application topically 2 (two) times daily. (Patient taking differently: Apply 1 application topically 2 (two) times daily as needed for irritation. ) 60 g 5  . losartan (COZAAR) 100 MG tablet TAKE 1 TABLET BY MOUTH EVERY DAY (Patient taking differently: Take 1 tablet by mouth every day.) 90 tablet 3  . metoprolol (LOPRESSOR) 50 MG tablet TAKE 1 TABLET BY MOUTH TWICE DAILY. 180 tablet 3  . nystatin (MYCOSTATIN) powder Use as directed twice per day as needed 60 g 1  . Vitamin D, Ergocalciferol, (DRISDOL) 50000 UNITS CAPS capsule TAKE 1 CAPSULE BY MOUTH ONCE A WEEK (Patient taking differently: Take 1 capsule by mouth once a week.) 12 capsule 3  . Fluticasone Furoate-Vilanterol (BREO ELLIPTA) 100-25 MCG/INH AEPB Inhale 1 puff into the lungs daily. 30 each 11  . predniSONE (STERAPRED UNI-PAK 21 TAB) 10 MG (21) TBPK tablet Take 6 tabs 4/27, then decrease by 10 mg daily until off. 21 tablet 0  . dextromethorphan (DELSYM) 30 MG/5ML liquid Take 10 mLs (60 mg total) by mouth 2 (two) times daily. (Patient not taking: Reported on 01/26/2015) 89 mL 0   No facility-administered medications prior to visit.    ROS Review of Systems  Constitutional: Positive for fatigue. Negative for chills, diaphoresis, appetite change and unexpected weight change.  HENT: Negative.  Negative for trouble swallowing.   Eyes: Negative.   Respiratory:  Positive for shortness of breath. Negative for cough, choking, chest tightness and stridor.   Cardiovascular: Negative.  Negative for chest pain, palpitations and leg swelling.  Gastrointestinal: Positive for abdominal pain, diarrhea and blood in stool. Negative for nausea, vomiting, constipation, abdominal distention and rectal pain.  Endocrine: Negative.   Genitourinary: Negative.   Musculoskeletal: Positive for arthralgias. Negative for joint swelling, gait problem and neck pain.  Skin: Negative.  Negative for color change, pallor and rash.    Allergic/Immunologic: Negative.   Neurological: Negative.  Negative for dizziness, tremors, light-headedness, numbness and headaches.  Hematological: Negative.  Negative for adenopathy. Does not bruise/bleed easily.  Psychiatric/Behavioral: Negative.     Objective:  BP 130/70 mmHg  Pulse 59  Temp(Src) 97.9 F (36.6 C) (Oral)  Resp 20  Ht 5' 1"  (1.549 m)  Wt 226 lb 8 oz (102.74 kg)  BMI 42.82 kg/m2  SpO2 97%  BP Readings from Last 3 Encounters:  01/26/15 130/70  11/25/14 136/60  11/23/14 155/57    Wt Readings from Last 3 Encounters:  01/26/15 226 lb 8 oz (102.74 kg)  11/25/14 226 lb (102.513 kg)  11/22/14 242 lb 8.1 oz (110 kg)    Physical Exam  Constitutional: She is oriented to person, place, and time. She appears well-developed and well-nourished.  Non-toxic appearance. She does not have a sickly appearance. She does not appear ill. No distress.  HENT:  Head: Normocephalic and atraumatic.  Mouth/Throat: Oropharynx is clear and moist. No oropharyngeal exudate.  Eyes: Conjunctivae are normal. Right eye exhibits no discharge. Left eye exhibits no discharge. No scleral icterus.  Neck: Normal range of motion. Neck supple. No JVD present. No tracheal deviation present. No thyromegaly present.  Cardiovascular: Normal rate, regular rhythm, normal heart sounds and intact distal pulses.  Exam reveals no gallop and no friction rub.   No murmur heard. Pulmonary/Chest: Effort normal and breath sounds normal. No stridor. No respiratory distress. She has no wheezes. She has no rales. She exhibits no tenderness.  Abdominal: Soft. Bowel sounds are normal. She exhibits no distension and no mass. There is no tenderness. There is no rebound and no guarding.  Musculoskeletal: Normal range of motion. She exhibits no edema.       Hands: Lymphadenopathy:    She has no cervical adenopathy.  Neurological: She is oriented to person, place, and time.  Skin: Skin is warm and dry. No rash noted.  She is not diaphoretic. No erythema. No pallor.  Psychiatric: She has a normal mood and affect. Her behavior is normal. Judgment and thought content normal.  Vitals reviewed.   Lab Results  Component Value Date   WBC 12.6* 01/26/2015   HGB 8.5 Repeated and verified X2.* 01/26/2015   HCT 26.3* 01/26/2015   PLT 310.0 01/26/2015   GLUCOSE 111* 01/26/2015   CHOL 164 11/25/2014   TRIG 140.0 11/25/2014   HDL 53.40 11/25/2014   LDLDIRECT 135.3 11/20/2010   LDLCALC 83 11/25/2014   ALT 14 11/17/2014   AST 24 11/17/2014   NA 140 01/26/2015   K 4.2 01/26/2015   CL 99 01/26/2015   CREATININE 1.47* 01/26/2015   BUN 44* 01/26/2015   CO2 33* 01/26/2015   TSH 1.03 11/25/2014   HGBA1C 6.3* 11/16/2014   MICROALBUR 15.9* 02/05/2013    Dg Chest 2 View (if Patient Has Fever And/or Copd)  11/16/2014   CLINICAL DATA:  Cough and wheezing since Saturday, shortness of breath, history COPD, hypertension, former smoker  EXAM: CHEST  2 VIEW  COMPARISON:  04/22/2014  FINDINGS: Minimal enlargement of cardiac silhouette with pulmonary vascular congestion.  Atherosclerotic calcification aorta.  Mediastinal contours normal.  Chronic mild accentuation of perihilar markings and minimal chronic peribronchial thickening.  Bibasilar atelectasis.  No definite acute infiltrate, pleural effusion or pneumothorax.  Bones demineralized.  IMPRESSION: Enlargement of cardiac silhouette with pulmonary vascular congestion.  Chronic bronchitic changes with bibasilar atelectasis.   Electronically Signed   By: Lavonia Dana M.D.   On: 11/16/2014 11:08    Assessment & Plan:   [name] was seen today for anemia, hypertension and copd.  Diagnoses and all orders for this visit:  Blood in stool - she has a persistent anemia, I have asked her to see GI to consider having endoscopy done Orders: -     CBC with Differential/Platelet; Future -     Ambulatory referral to Gastroenterology  Acute idiopathic gout, unspecified site - will  start colchicine Orders: -     colchicine (COLCRYS) 0.6 MG tablet; Take 1 tablet (0.6 mg total) by mouth 2 (two) times daily.  Pernicious anemia - H and H are down some, recent vit levels were normal, I have asked her to f/up with GI Orders: -     CBC with Differential/Platelet; Future  ULCERATIVE COLITIS-LEFT SIDE Orders: -     Ambulatory referral to Gastroenterology  Essential hypertension - her BP is well controlled, lytes and renal function are stable Orders: -     Basic metabolic panel; Future  Chronic obstructive pulmonary disease, unspecified COPD, unspecified chronic bronchitis type - improvement noted Orders: -     Fluticasone Furoate-Vilanterol (BREO ELLIPTA) 100-25 MCG/INH AEPB; Inhale 1 puff into the lungs daily.  I have discontinued Ms. Brueckner's Fluticasone Furoate-Vilanterol, dextromethorphan, and predniSONE. I am also having her start on colchicine and Fluticasone Furoate-Vilanterol. Additionally, I am having her maintain her aspirin, albuterol, losartan, Vitamin D (Ergocalciferol), VASCULERA, ketoconazole, allopurinol, acetaminophen, feeding supplement (ENSURE ENLIVE), nystatin, albuterol, furosemide, and metoprolol.  Meds ordered this encounter  Medications  . colchicine (COLCRYS) 0.6 MG tablet    Sig: Take 1 tablet (0.6 mg total) by mouth 2 (two) times daily.    Dispense:  60 tablet    Refill:  5  . Fluticasone Furoate-Vilanterol (BREO ELLIPTA) 100-25 MCG/INH AEPB    Sig: Inhale 1 puff into the lungs daily.    Dispense:  30 each    Refill:  11     Follow-up: Return in about 4 months (around 05/28/2015).  [name], MD

## 2015-01-26 NOTE — Patient Instructions (Signed)

## 2015-01-26 NOTE — Progress Notes (Signed)
Pre visit review using our clinic review tool, if applicable. No additional management support is needed unless otherwise documented below in the visit note. 

## 2015-02-01 ENCOUNTER — Encounter: Payer: Self-pay | Admitting: Gastroenterology

## 2015-02-07 ENCOUNTER — Ambulatory Visit: Payer: Medicare Other | Admitting: Gastroenterology

## 2015-02-11 ENCOUNTER — Other Ambulatory Visit: Payer: Self-pay | Admitting: Internal Medicine

## 2015-02-15 ENCOUNTER — Encounter: Payer: Self-pay | Admitting: *Deleted

## 2015-02-16 ENCOUNTER — Other Ambulatory Visit (INDEPENDENT_AMBULATORY_CARE_PROVIDER_SITE_OTHER): Payer: Medicare Other

## 2015-02-16 ENCOUNTER — Ambulatory Visit (INDEPENDENT_AMBULATORY_CARE_PROVIDER_SITE_OTHER): Payer: Medicare Other | Admitting: Nurse Practitioner

## 2015-02-16 ENCOUNTER — Encounter: Payer: Self-pay | Admitting: Nurse Practitioner

## 2015-02-16 VITALS — BP 100/56 | HR 64 | Ht 60.0 in | Wt 227.8 lb

## 2015-02-16 DIAGNOSIS — D509 Iron deficiency anemia, unspecified: Secondary | ICD-10-CM

## 2015-02-16 DIAGNOSIS — R195 Other fecal abnormalities: Secondary | ICD-10-CM | POA: Diagnosis not present

## 2015-02-16 DIAGNOSIS — K625 Hemorrhage of anus and rectum: Secondary | ICD-10-CM | POA: Diagnosis not present

## 2015-02-16 DIAGNOSIS — K921 Melena: Secondary | ICD-10-CM | POA: Diagnosis not present

## 2015-02-16 LAB — CBC
HCT: 26.3 % — ABNORMAL LOW (ref 36.0–46.0)
Hemoglobin: 8.5 g/dL — ABNORMAL LOW (ref 12.0–15.0)
MCHC: 32.3 g/dL (ref 30.0–36.0)
MCV: 75.4 fl — ABNORMAL LOW (ref 78.0–100.0)
Platelets: 281 K/uL (ref 150.0–400.0)
RBC: 3.49 Mil/uL — ABNORMAL LOW (ref 3.87–5.11)
RDW: 17.8 % — ABNORMAL HIGH (ref 11.5–15.5)
WBC: 10 K/uL (ref 4.0–10.5)

## 2015-02-16 MED ORDER — OMEPRAZOLE 40 MG PO CPDR: DELAYED_RELEASE_CAPSULE | ORAL | Status: DC

## 2015-02-16 NOTE — Progress Notes (Signed)
HPI :   Patient is a 74 year old female with multiple medical problems, on multiple medications referred by PCP for rectal bleeding. She was followed here by Dr. Sharlett Iles for a history of adenomatous colon polyps (2009). Her 2009 colonoscopy also revealed left sided inflammation. Biopsies c/w minimally active chronic colitis. Patient was tried on Mesalamine but couldn't tolerate it secondary to worsening diarrhea.   [name] has had intermittent rectal bleeding for years. Stools range solid to liquid in consistency. Over the last 6 months she has has intermittent black stools. Black stools usually following an episode of stomach growling followed by a "twisting"  Sensation in her abdomen. No NSAID use. Hgb has remained around baseline at 8.5.   Past Medical History  Diagnosis Date  . HTN (hypertension) 07/30/1988  . GERD (gastroesophageal reflux disease) 07/30/92  . COPD (chronic obstructive pulmonary disease) 01/27/98  . Hyperlipidemia 11/27/96  . CHF (congestive heart failure) 08/31/03  . Gout   . Renal insufficiency   . Kidney stone 1960, 1972, 1991  . Ulcerative colitis   . Colonic polyp 02/16/2008    Tubular adenoma    Family History  Problem Relation Age of Onset  . Stroke Mother   . Diabetes Mother   . Kidney cancer Mother   . Stomach cancer Mother   . Heart disease Mother   . Stroke Father   . Heart disease Sister     CATH,STENT  . Clotting disorder Sister   . Cervical cancer Sister   . Heart disease Brother   . Colon cancer Neg Hx   . Heart disease Brother   . Crohn's disease Brother   . Lung cancer Sister   . Heart attack Sister   . Diabetes Sister    History  Substance Use Topics  . Smoking status: Former Smoker    Quit date: 06/01/1996  . Smokeless tobacco: Never Used     Comment: Quit 1998  . Alcohol Use: No   Current Outpatient Prescriptions  Medication Sig Dispense Refill  . acetaminophen (TYLENOL) 325 MG tablet Take 2 tablets (650 mg total) by mouth  every 6 (six) hours as needed for mild pain or headache (or Fever >/= 101).    Marland Kitchen albuterol (PROAIR HFA) 108 (90 BASE) MCG/ACT inhaler Inhale 1-2 puffs into the lungs every 6 (six) hours as needed for wheezing or shortness of breath. (Patient taking differently: Inhale 1-2 puffs into the lungs every 6 (six) hours as needed for wheezing or shortness of breath. Patient take once a day instead of three times a day.) 18 g 11  . albuterol (PROVENTIL) (2.5 MG/3ML) 0.083% nebulizer solution Take 3 mLs (2.5 mg total) by nebulization every 6 (six) hours as needed for wheezing or shortness of breath. (Patient taking differently: Take 2.5 mg by nebulization every 6 (six) hours as needed for wheezing or shortness of breath. Patient only takes once a day instead of three times a day) 150 mL 11  . allopurinol (ZYLOPRIM) 100 MG tablet TAKE 1 TABLET BY MOUTH EVERY DAY FOR GOUT. 90 tablet 1  . aspirin 81 MG EC tablet Take 81 mg by mouth daily.      . colchicine (COLCRYS) 0.6 MG tablet Take 1 tablet (0.6 mg total) by mouth 2 (two) times daily. 60 tablet 5  . Dietary Management Product (VASCULERA) TABS TAKE 1 TABLET BY MOUTH EVERY DAY. (Patient taking differently: Take 1 tablet by mouth every day.) 30 tablet 11  . feeding supplement, ENSURE  ENLIVE, (ENSURE ENLIVE) LIQD Take 237 mLs by mouth 2 (two) times daily between meals. 237 mL 12  . Fluticasone Furoate-Vilanterol (BREO ELLIPTA) 100-25 MCG/INH AEPB Inhale 1 puff into the lungs daily. 30 each 11  . furosemide (LASIX) 40 MG tablet TAKE 1 TABLET BY MOUTH TWICE DAILY. 180 tablet 1  . ketoconazole (NIZORAL) 2 % cream Apply 1 application topically 2 (two) times daily. (Patient taking differently: Apply 1 application topically 2 (two) times daily as needed for irritation. ) 60 g 5  . losartan (COZAAR) 100 MG tablet TAKE 1 TABLET BY MOUTH EVERY DAY (Patient taking differently: Take 1 tablet by mouth every day.) 90 tablet 3  . metoprolol (LOPRESSOR) 50 MG tablet TAKE 1 TABLET BY  MOUTH TWICE DAILY. 180 tablet 3  . nystatin (MYCOSTATIN) powder Use as directed twice per day as needed 60 g 1  . Vitamin D, Ergocalciferol, (DRISDOL) 50000 UNITS CAPS capsule TAKE 1 CAPSULE BY MOUTH ONCE A WEEK (Patient taking differently: Take 1 capsule by mouth once a week.) 12 capsule 3  . [DISCONTINUED] olmesartan-hydrochlorothiazide (BENICAR HCT) 40-25 MG per tablet Take 1 tablet by mouth daily. 90 tablet 3   No current facility-administered medications for this visit.   Allergies  Allergen Reactions  . Celebrex [Celecoxib]     edema  . Enalapril Cough  . Lipitor [Atorvastatin]     Muscle aches  . Amlodipine Besylate     REACTION: edema  . Amoxicillin     REACTION: Diarrhea  . Codeine Sulfate     REACTION: Nausea  . Hydrocodone-Acetaminophen     REACTION: Nausea     Review of Systems: Positive for arthritis, back pain, cough, fatigue, fever, heart murmur, shortness of breath and swelling of feet. All other systems reviewed and negative except where noted in HPI.    Physical Exam: BP 100/56 mmHg  Pulse 64  Ht 5' (1.524 m)  Wt 227 lb 12.8 oz (103.329 kg)  BMI 44.49 kg/m2 Constitutional: Pleasant, obese white female in no acute distress. HEENT: Normocephalic and atraumatic. Conjunctivae are normal. No scleral icterus. Neck supple.  Cardiovascular: Normal rate, regular rhythm.  Pulmonary/chest: Effort normal and breath sounds normal. No wheezing, rales or rhonchi. O2 per [place].  Abdominal: limited exam, didn't get on exam table. Abdomen soft, obese,  nontender. Bowel sounds active throughout.  Rectal: heme positive brown stool Lymphadenopathy: No cervical adenopathy noted. Neurological: Alert and oriented to person place and time. Skin: Skin is warm and dry. No rashes noted. Psychiatric: Normal mood and affect. Behavior is normal.   ASSESSMENT AND PLAN:  1. Pleasant 74 year old female with chronic rectal bleeding. Amazingly her hgb has remained stable though she is  chronicallly anemic. Rule out colitis vrs perianal source of bleeding. For further evaluation of bleeding, and hx of polyps patient will be scheduled for colonoscopy at the hospital with MAC. She is at increased risk for endoscopic procedures, she understands this. The benefits, risks, and potential complications of colonoscopy with possible biopsy and possible polypectomy were discussed with the patient and she consents to proceed.    2. Intermittent black stool. Stool brown today but it is heme positive. For further evaluation patient will be scheduled for EGD to be done at time of colonoscopy. The benefits, risks, and potential complications of EGD with possible biopsies and were discussed with the patient and she agrees to proceed.   3. Hx of segmental colitis 2009, mildly active chronic colitis on biopsy. She couldn't tolerate mesamaine  4. Hx of adenomatous colon polyps 2009, overdue for surveillance exam.   5. Multiple significant medical problems, on home 02. Procedure will be done at hospital with MAC.  Cc: Laurie Calico, MD

## 2015-02-16 NOTE — Patient Instructions (Signed)
We sent a prescription to [name].  You have been scheduled for an endoscopy and colonoscopy. Please follow the written instructions given to you at your visit today. Please pick up your prep supplies at the pharmacy within the next 1-3 days. If you use inhalers (even only as needed), please bring them with you on the day of your procedure. Your physician has requested that you go to www.startemmi.com and enter the access code given to you at your visit today. This web site gives a general overview about your procedure. However, you should still follow specific instructions given to you by our office regarding your preparation for the procedure.

## 2015-02-17 ENCOUNTER — Telehealth: Payer: Self-pay | Admitting: *Deleted

## 2015-02-17 ENCOUNTER — Encounter (HOSPITAL_COMMUNITY): Payer: Self-pay | Admitting: *Deleted

## 2015-02-17 NOTE — Telephone Encounter (Signed)
Called pt on cell number and asked her to call me.  When I scheduled her at the hospital, at the same time someone else was scheduling for that same date and time.  [name] from [facility] called to tell me.  [name] moved the pt to the day before 7-27 at 1:30 PM.

## 2015-02-17 NOTE — Telephone Encounter (Signed)
I called and spoke to the patient. I apologized for having to change her appointment for the Colon/Egd for 02-23-2015.The arrival time is 12:00 Noon. I reminded her she will receive several calls from the hospital.I reviewed the patient's instructions with her over the phone. Patient verbally understood instructions.

## 2015-02-18 ENCOUNTER — Encounter: Payer: Self-pay | Admitting: Nurse Practitioner

## 2015-02-18 DIAGNOSIS — R195 Other fecal abnormalities: Secondary | ICD-10-CM | POA: Insufficient documentation

## 2015-02-18 DIAGNOSIS — D509 Iron deficiency anemia, unspecified: Secondary | ICD-10-CM | POA: Insufficient documentation

## 2015-02-18 NOTE — Progress Notes (Signed)
Reviewed and agree with management plan.  Marria Mathison T. Kelie Gainey, MD FACG 

## 2015-02-22 DIAGNOSIS — J449 Chronic obstructive pulmonary disease, unspecified: Secondary | ICD-10-CM | POA: Diagnosis not present

## 2015-02-23 ENCOUNTER — Ambulatory Visit (HOSPITAL_COMMUNITY): Payer: Medicare Other | Admitting: Anesthesiology

## 2015-02-23 ENCOUNTER — Encounter (HOSPITAL_COMMUNITY)
Admission: RE | Disposition: A | Discharge status: Home / Self Care | Payer: Self-pay | Source: Ambulatory Visit | Attending: Gastroenterology

## 2015-02-23 ENCOUNTER — Encounter (HOSPITAL_COMMUNITY): Payer: Self-pay

## 2015-02-23 ENCOUNTER — Ambulatory Visit (HOSPITAL_COMMUNITY)
Admission: RE | Admit: 2015-02-23 | Discharge: 2015-02-23 | Disposition: A | Discharge status: Home / Self Care | Payer: Medicare Other | Source: Ambulatory Visit | Attending: Gastroenterology | Admitting: Gastroenterology

## 2015-02-23 DIAGNOSIS — Z87891 Personal history of nicotine dependence: Secondary | ICD-10-CM | POA: Insufficient documentation

## 2015-02-23 DIAGNOSIS — K5521 Angiodysplasia of colon with hemorrhage: Secondary | ICD-10-CM | POA: Diagnosis not present

## 2015-02-23 DIAGNOSIS — G4730 Sleep apnea, unspecified: Secondary | ICD-10-CM | POA: Insufficient documentation

## 2015-02-23 DIAGNOSIS — Z79899 Other long term (current) drug therapy: Secondary | ICD-10-CM | POA: Insufficient documentation

## 2015-02-23 DIAGNOSIS — K279 Peptic ulcer, site unspecified, unspecified as acute or chronic, without hemorrhage or perforation: Secondary | ICD-10-CM | POA: Diagnosis not present

## 2015-02-23 DIAGNOSIS — J449 Chronic obstructive pulmonary disease, unspecified: Secondary | ICD-10-CM | POA: Diagnosis not present

## 2015-02-23 DIAGNOSIS — K625 Hemorrhage of anus and rectum: Secondary | ICD-10-CM

## 2015-02-23 DIAGNOSIS — Z7982 Long term (current) use of aspirin: Secondary | ICD-10-CM | POA: Diagnosis not present

## 2015-02-23 DIAGNOSIS — D122 Benign neoplasm of ascending colon: Secondary | ICD-10-CM | POA: Insufficient documentation

## 2015-02-23 DIAGNOSIS — Z7951 Long term (current) use of inhaled steroids: Secondary | ICD-10-CM | POA: Diagnosis not present

## 2015-02-23 DIAGNOSIS — E785 Hyperlipidemia, unspecified: Secondary | ICD-10-CM | POA: Diagnosis not present

## 2015-02-23 DIAGNOSIS — K5730 Diverticulosis of large intestine without perforation or abscess without bleeding: Secondary | ICD-10-CM | POA: Insufficient documentation

## 2015-02-23 DIAGNOSIS — K449 Diaphragmatic hernia without obstruction or gangrene: Secondary | ICD-10-CM | POA: Diagnosis not present

## 2015-02-23 DIAGNOSIS — I10 Essential (primary) hypertension: Secondary | ICD-10-CM | POA: Insufficient documentation

## 2015-02-23 DIAGNOSIS — K31811 Angiodysplasia of stomach and duodenum with bleeding: Secondary | ICD-10-CM | POA: Diagnosis not present

## 2015-02-23 DIAGNOSIS — M109 Gout, unspecified: Secondary | ICD-10-CM | POA: Insufficient documentation

## 2015-02-23 DIAGNOSIS — I509 Heart failure, unspecified: Secondary | ICD-10-CM | POA: Diagnosis not present

## 2015-02-23 DIAGNOSIS — K640 First degree hemorrhoids: Secondary | ICD-10-CM | POA: Diagnosis not present

## 2015-02-23 DIAGNOSIS — R195 Other fecal abnormalities: Secondary | ICD-10-CM

## 2015-02-23 DIAGNOSIS — Z6841 Body Mass Index (BMI) 40.0 and over, adult: Secondary | ICD-10-CM | POA: Diagnosis not present

## 2015-02-23 DIAGNOSIS — D649 Anemia, unspecified: Secondary | ICD-10-CM | POA: Insufficient documentation

## 2015-02-23 DIAGNOSIS — Z86010 Personal history of colonic polyps: Secondary | ICD-10-CM | POA: Diagnosis not present

## 2015-02-23 DIAGNOSIS — D509 Iron deficiency anemia, unspecified: Secondary | ICD-10-CM | POA: Diagnosis not present

## 2015-02-23 DIAGNOSIS — K219 Gastro-esophageal reflux disease without esophagitis: Secondary | ICD-10-CM | POA: Diagnosis not present

## 2015-02-23 HISTORY — PX: COLONOSCOPY WITH PROPOFOL: SHX5780

## 2015-02-23 HISTORY — PX: ESOPHAGOGASTRODUODENOSCOPY (EGD) WITH PROPOFOL: SHX5813

## 2015-02-23 SURGERY — COLONOSCOPY WITH PROPOFOL
Anesthesia: Monitor Anesthesia Care

## 2015-02-23 MED ORDER — SODIUM CHLORIDE 0.9 % IV SOLN: INTRAVENOUS | Status: DC

## 2015-02-23 MED ORDER — PROPOFOL 10 MG/ML IV BOLUS
INTRAVENOUS | Status: AC
  Filled 2015-02-23: qty 20

## 2015-02-23 MED ORDER — LACTATED RINGERS IV SOLN
INTRAVENOUS | Status: DC
  Administered 2015-02-23: 1000 mL via INTRAVENOUS

## 2015-02-23 MED ORDER — PROPOFOL 10 MG/ML IV BOLUS
INTRAVENOUS | Status: DC | PRN
  Administered 2015-02-23: 50 mg via INTRAVENOUS

## 2015-02-23 MED ORDER — PROPOFOL INFUSION 10 MG/ML OPTIME
INTRAVENOUS | Status: DC | PRN
  Administered 2015-02-23: 100 ug/kg/min via INTRAVENOUS

## 2015-02-23 SURGICAL SUPPLY — 24 items

## 2015-02-23 NOTE — H&P (View-Only) (Signed)
HPI :   Patient is a 74 year old female with multiple medical problems, on multiple medications referred by PCP for rectal bleeding. She was followed here by Dr. Sharlett Iles for a history of adenomatous colon polyps (2009). Her 2009 colonoscopy also revealed left sided inflammation. Biopsies c/w minimally active chronic colitis. Patient was tried on Mesalamine but couldn't tolerate it secondary to worsening diarrhea.   Ms. Ebling has had intermittent rectal bleeding for years. Stools range solid to liquid in consistency. Over the last 6 months she has has intermittent black stools. Black stools usually following an episode of stomach growling followed by a "twisting"  Sensation in her abdomen. No NSAID use. Hgb has remained around baseline at 8.5.   Past Medical History  Diagnosis Date  . HTN (hypertension) 07/30/1988  . GERD (gastroesophageal reflux disease) 07/30/92  . COPD (chronic obstructive pulmonary disease) 01/27/98  . Hyperlipidemia 11/27/96  . CHF (congestive heart failure) 08/31/03  . Gout   . Renal insufficiency   . Kidney stone 1960, 1972, 1991  . Ulcerative colitis   . Colonic polyp 02/16/2008    Tubular adenoma    Family History  Problem Relation Age of Onset  . Stroke Mother   . Diabetes Mother   . Kidney cancer Mother   . Stomach cancer Mother   . Heart disease Mother   . Stroke Father   . Heart disease Sister     CATH,STENT  . Clotting disorder Sister   . Cervical cancer Sister   . Heart disease Brother   . Colon cancer Neg Hx   . Heart disease Brother   . Crohn's disease Brother   . Lung cancer Sister   . Heart attack Sister   . Diabetes Sister    History  Substance Use Topics  . Smoking status: Former Smoker    Quit date: 06/01/1996  . Smokeless tobacco: Never Used     Comment: Quit 1998  . Alcohol Use: No   Current Outpatient Prescriptions  Medication Sig Dispense Refill  . acetaminophen (TYLENOL) 325 MG tablet Take 2 tablets (650 mg total) by mouth  every 6 (six) hours as needed for mild pain or headache (or Fever >/= 101).    Marland Kitchen albuterol (PROAIR HFA) 108 (90 BASE) MCG/ACT inhaler Inhale 1-2 puffs into the lungs every 6 (six) hours as needed for wheezing or shortness of breath. (Patient taking differently: Inhale 1-2 puffs into the lungs every 6 (six) hours as needed for wheezing or shortness of breath. Patient take once a day instead of three times a day.) 18 g 11  . albuterol (PROVENTIL) (2.5 MG/3ML) 0.083% nebulizer solution Take 3 mLs (2.5 mg total) by nebulization every 6 (six) hours as needed for wheezing or shortness of breath. (Patient taking differently: Take 2.5 mg by nebulization every 6 (six) hours as needed for wheezing or shortness of breath. Patient only takes once a day instead of three times a day) 150 mL 11  . allopurinol (ZYLOPRIM) 100 MG tablet TAKE 1 TABLET BY MOUTH EVERY DAY FOR GOUT. 90 tablet 1  . aspirin 81 MG EC tablet Take 81 mg by mouth daily.      . colchicine (COLCRYS) 0.6 MG tablet Take 1 tablet (0.6 mg total) by mouth 2 (two) times daily. 60 tablet 5  . Dietary Management Product (VASCULERA) TABS TAKE 1 TABLET BY MOUTH EVERY DAY. (Patient taking differently: Take 1 tablet by mouth every day.) 30 tablet 11  . feeding supplement, ENSURE  ENLIVE, (ENSURE ENLIVE) LIQD Take 237 mLs by mouth 2 (two) times daily between meals. 237 mL 12  . Fluticasone Furoate-Vilanterol (BREO ELLIPTA) 100-25 MCG/INH AEPB Inhale 1 puff into the lungs daily. 30 each 11  . furosemide (LASIX) 40 MG tablet TAKE 1 TABLET BY MOUTH TWICE DAILY. 180 tablet 1  . ketoconazole (NIZORAL) 2 % cream Apply 1 application topically 2 (two) times daily. (Patient taking differently: Apply 1 application topically 2 (two) times daily as needed for irritation. ) 60 g 5  . losartan (COZAAR) 100 MG tablet TAKE 1 TABLET BY MOUTH EVERY DAY (Patient taking differently: Take 1 tablet by mouth every day.) 90 tablet 3  . metoprolol (LOPRESSOR) 50 MG tablet TAKE 1 TABLET BY  MOUTH TWICE DAILY. 180 tablet 3  . nystatin (MYCOSTATIN) powder Use as directed twice per day as needed 60 g 1  . Vitamin D, Ergocalciferol, (DRISDOL) 50000 UNITS CAPS capsule TAKE 1 CAPSULE BY MOUTH ONCE A WEEK (Patient taking differently: Take 1 capsule by mouth once a week.) 12 capsule 3  . [DISCONTINUED] olmesartan-hydrochlorothiazide (BENICAR HCT) 40-25 MG per tablet Take 1 tablet by mouth daily. 90 tablet 3   No current facility-administered medications for this visit.   Allergies  Allergen Reactions  . Celebrex [Celecoxib]     edema  . Enalapril Cough  . Lipitor [Atorvastatin]     Muscle aches  . Amlodipine Besylate     REACTION: edema  . Amoxicillin     REACTION: Diarrhea  . Codeine Sulfate     REACTION: Nausea  . Hydrocodone-Acetaminophen     REACTION: Nausea     Review of Systems: Positive for arthritis, back pain, cough, fatigue, fever, heart murmur, shortness of breath and swelling of feet. All other systems reviewed and negative except where noted in HPI.    Physical Exam: BP 100/56 mmHg  Pulse 64  Ht 5' (1.524 m)  Wt 227 lb 12.8 oz (103.329 kg)  BMI 44.49 kg/m2 Constitutional: Pleasant, obese white female in no acute distress. HEENT: Normocephalic and atraumatic. Conjunctivae are normal. No scleral icterus. Neck supple.  Cardiovascular: Normal rate, regular rhythm.  Pulmonary/chest: Effort normal and breath sounds normal. No wheezing, rales or rhonchi. O2 per [place].  Abdominal: limited exam, didn't get on exam table. Abdomen soft, obese,  nontender. Bowel sounds active throughout.  Rectal: heme positive brown stool Lymphadenopathy: No cervical adenopathy noted. Neurological: Alert and oriented to person place and time. Skin: Skin is warm and dry. No rashes noted. Psychiatric: Normal mood and affect. Behavior is normal.   ASSESSMENT AND PLAN:  1. Pleasant 74 year old female with chronic rectal bleeding. Amazingly her hgb has remained stable though she is  chronicallly anemic. Rule out colitis vrs perianal source of bleeding. For further evaluation of bleeding, and hx of polyps patient will be scheduled for colonoscopy at the hospital with MAC. She is at increased risk for endoscopic procedures, she understands this. The benefits, risks, and potential complications of colonoscopy with possible biopsy and possible polypectomy were discussed with the patient and she consents to proceed.    2. Intermittent black stool. Stool brown today but it is heme positive. For further evaluation patient will be scheduled for EGD to be done at time of colonoscopy. The benefits, risks, and potential complications of EGD with possible biopsies and were discussed with the patient and she agrees to proceed.   3. Hx of segmental colitis 2009, mildly active chronic colitis on biopsy. She couldn't tolerate mesamaine  4. Hx of adenomatous colon polyps 2009, overdue for surveillance exam.   5. Multiple significant medical problems, on home 02. Procedure will be done at hospital with MAC.  Cc: [name], MD

## 2015-02-23 NOTE — Op Note (Signed)
[facility] [place], [address]   ENDOSCOPY PROCEDURE REPORT  PATIENT: [name], [name]  MR#: [id number] BIRTHDATE: 11-04-1940 , 73  yrs. old GENDER: female ENDOSCOPIST: Ladene Artist, MD, [facility] Regal REFERRED BY:  [name], M.D. PROCEDURE DATE:  02/23/2015 PROCEDURE:  EGD, diagnostic ASA CLASS:     Class III INDICATIONS:  hemocult positive stool, anemia, and history of esophageal reflux. MEDICATIONS: Monitored anesthesia care, Residual sedation present, and Per Anesthesia TOPICAL ANESTHETIC: none DESCRIPTION OF PROCEDURE: After the risks benefits and alternatives of the procedure were thoroughly explained, informed consent was obtained.  The    endoscope was introduced through the mouth and advanced to the second portion of the duodenum , Without limitations.  The instrument was slowly withdrawn as the mucosa was fully examined.    DUODENUM: A non bleeding arteriovenous malformation measuring 44m in size was found in the 2nd part of the duodenum.   The duodenal mucosa showed no abnormalities in the duodenal bulb. STOMACH: The mucosa and folds of the stomach appeared normal. ESOPHAGUS: The mucosa of the esophagus appeared normal.  Retroflexed views revealed a small hiatal hernia.  The scope was then withdrawn from the patient and the procedure completed.  COMPLICATIONS: There were no immediate complications.  ENDOSCOPIC IMPRESSION: 1.   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   Small hiatal hernia  RECOMMENDATIONS: 1.  Anti-reflux regimen long term 2.  Continue PPI long term 3.  See colonoscopy recommendations regarding AVMs  eSigned:  MLadene Artist MD, [facility]07/27/2016 2:58 PM

## 2015-02-23 NOTE — Transfer of Care (Signed)
Immediate Anesthesia Transfer of Care Note  Patient: ANIYAH NOBIS  Procedure(s) Performed: Procedure(s): COLONOSCOPY WITH PROPOFOL (N/A) ESOPHAGOGASTRODUODENOSCOPY (EGD) WITH PROPOFOL (N/A)  Patient Location: PACU and Endoscopy Unit  Anesthesia Type:MAC  Level of Consciousness: awake, alert , oriented and patient cooperative  Airway & Oxygen Therapy: Patient Spontanous Breathing and Patient connected to nasal cannula oxygen  Post-op Assessment: Report given to RN and Post -op Vital signs reviewed and stable  Post vital signs: Reviewed and stable  Last Vitals:  Filed Vitals:   02/23/15 1459  BP: 154/44  Pulse: 71  Temp: 36.7 C  Resp: 19    Complications: No apparent anesthesia complications

## 2015-02-23 NOTE — Anesthesia Preprocedure Evaluation (Addendum)
Anesthesia Evaluation  Patient identified by MRN, date of birth, ID band Patient awake    Reviewed: Allergy & Precautions, NPO status , Patient's Chart, lab work & pertinent test results, reviewed documented beta blocker date and time   Airway Mallampati: II  TM Distance: >3 FB Neck ROM: Full    Dental   Pulmonary sleep apnea , COPD COPD inhaler, former smoker,  breath sounds clear to auscultation        Cardiovascular hypertension, Pt. on medications and Pt. on home beta blockers +CHF Rhythm:Regular Rate:Normal     Neuro/Psych negative neurological ROS     GI/Hepatic Neg liver ROS, PUD, GERD-  ,  Endo/Other  Morbid obesity  Renal/GU CRFRenal disease     Musculoskeletal   Abdominal   Peds  Hematology  (+) anemia ,   Anesthesia Other Findings   Reproductive/Obstetrics                            Anesthesia Physical Anesthesia Plan  ASA: III  Anesthesia Plan: MAC   Post-op Pain Management:    Induction: Intravenous  Airway Management Planned: Natural Airway, Nasal Cannula and Simple Face Mask  Additional Equipment:   Intra-op Plan:   Post-operative Plan:   Informed Consent: I have reviewed the patients History and Physical, chart, labs and discussed the procedure including the risks, benefits and alternatives for the proposed anesthesia with the patient or authorized representative who has indicated his/her understanding and acceptance.     Plan Discussed with: CRNA  Anesthesia Plan Comments:         Anesthesia Quick Evaluation

## 2015-02-23 NOTE — Discharge Instructions (Signed)
Colonoscopy, Care After These instructions give you information on caring for yourself after your procedure. Your doctor may also give you more specific instructions. Call your doctor if you have any problems or questions after your procedure. HOME CARE  Do not drive for 24 hours.  Do not sign important papers or use machinery for 24 hours.  You may shower.  You may go back to your usual activities, but go slower for the first 24 hours.  Take rest breaks often during the first 24 hours.  Walk around or use warm packs on your belly (abdomen) if you have belly cramping or gas.  Drink enough fluids to keep your pee (urine) clear or pale yellow.  Resume your normal diet. Avoid heavy or fried foods.  Avoid drinking alcohol for 24 hours or as told by your doctor.  Only take medicines as told by your doctor. If a tissue sample (biopsy) was taken during the procedure:   Do not take aspirin or blood thinners for 7 days, or as told by your doctor.  Do not drink alcohol for 7 days, or as told by your doctor.  Eat soft foods for the first 24 hours. GET HELP IF: You still have a small amount of blood in your poop (stool) 2-3 days after the procedure. GET HELP RIGHT AWAY IF:  You have more than a small amount of blood in your poop.  You see clumps of tissue (blood clots) in your poop.  Your belly is puffy (swollen).  You feel sick to your stomach (nauseous) or throw up (vomit).  You have a fever.  You have belly pain that gets worse and medicine does not help. MAKE SURE YOU:  Understand these instructions.  Will watch your condition.  Will get help right away if you are not doing well or get worse. Document Released: 08/18/2010 Document Revised: 07/21/2013 Document Reviewed: 03/23/2013 [facility] Patient Information 2015 [place], [place]. This information is not intended to replace advice given to you by your health care provider. Make sure you discuss any questions you have with  your health care provider. Esophagogastroduodenoscopy Care After Refer to this sheet in the next few weeks. These instructions provide you with information on caring for yourself after your procedure. Your caregiver may also give you more specific instructions. Your treatment has been planned according to current medical practices, but problems sometimes occur. Call your caregiver if you have any problems or questions after your procedure.  HOME CARE INSTRUCTIONS  Do not eat or drink anything until the numbing medicine (local anesthetic) has worn off and your gag reflex has returned. You will know that the local anesthetic has worn off when you can swallow comfortably.  Do not drive for 12 hours after the procedure or as directed by your caregiver.  Only take medicines as directed by your caregiver. SEEK MEDICAL CARE IF:   You cannot stop coughing.  You are not urinating at all or less than usual. SEEK IMMEDIATE MEDICAL CARE IF:  You have difficulty swallowing.  You cannot eat or drink.  You have worsening throat or chest pain.  You have dizziness, lightheadedness, or you faint.  You have nausea or vomiting.  You have chills.  You have a fever.  You have severe abdominal pain.  You have black, tarry, or bloody stools. Document Released: 07/02/2012 Document Reviewed: 07/02/2012 [facility] Patient Information 2015 [place]. This information is not intended to replace advice given to you by your health care provider. Make sure you  discuss any questions you have with your health care provider.

## 2015-02-23 NOTE — Op Note (Signed)
[facility] [place], [address]   COLONOSCOPY PROCEDURE REPORT PATIENT: [name], [name]  MR#: [id number] BIRTHDATE: April 22, 1941 , 12  yrs. old GENDER: female ENDOSCOPIST: Ladene Artist, MD, [facility] REFERRED BY:   [name], MD [R PROCEDURE DATE:  02/23/2015 PROCEDURE:   Colonoscopy, diagnostic and Colonoscopy with snare polypectomy First Screening Colonoscopy - Avg.  risk and is 50 yrs.  old or older - No.  Prior Negative Screening - Now for repeat screening. N/A  History of Adenoma - Now for follow-up colonoscopy & has been > or = to 3 yrs.  Yes hx of adenoma.  Has been 3 or more years since last colonoscopy.  Polyps removed today? Yes ASA CLASS:   Class III INDICATIONS:Evaluation of unexplained GI bleeding, PH Colon Adenoma, hematochezia, heme-positive stool, and anemia, non-specific. MEDICATIONS: Monitored anesthesia care and Per Anesthesia DESCRIPTION OF PROCEDURE:   After the risks benefits and alternatives of the procedure were thoroughly explained, informed consent was obtained.  The digital rectal exam revealed no abnormalities of the rectum.   The     endoscope was introduced through the anus and advanced to the cecum, which was identified by both the appendix and ileocecal valve. No adverse events experienced with a tortuous colon.   The quality of the prep was good.  (Suprep was used)  The instrument was then slowly withdrawn as the colon was fully examined. Estimated blood loss is zero unless otherwise noted in this procedure report.  COLON FINDINGS: Four nonbleeding angiodysplastic lesions, measuring 4-6 mm, were found at the cecum.  A sessile polyp measuring 6 mm in size was found in the ascending colon.  A polypectomy was performed with a cold snare.  The resection was complete, the polyp tissue was completely retrieved and sent to histology.   There was mild diverticulosis noted in the sigmoid colon.   The examination  was otherwise normal.  Retroflexed views revealed internal Grade I hemorrhoids. The time to cecum = 9.1 Withdrawal time = 10.9   The scope was withdrawn and the procedure completed. COMPLICATIONS: There were no immediate complications. ENDOSCOPIC IMPRESSION: 1.   Four angiodysplastic lesions at the cecum 2.   Sessile polyp in the ascending colon; polypectomy performed with a cold snare 3.   Mild diverticulosis in the sigmoid colon 4.   Grade l internal hemorrhoids  RECOMMENDATIONS: 1.  Hold aspirin, aspirin products, and anti-inflammatory medication for 1 week. 2.  Await pathology results 3.  Given your age and health problems, you will not need another colonoscopy for colon cancer screening or polyp surveillance. These types of tests usually stop around the age 68. 4.  Chronic blood loss from AVMs. Long term Fe replacement and monitor CBC per PCP.  eSigned:  Ladene Artist, MD, [facility] 02/23/2015 2:53 PM

## 2015-02-23 NOTE — Interval H&P Note (Signed)
History and Physical Interval Note:  02/23/2015 12:14 PM  [name]  has presented today for surgery, with the diagnosis of Anemia Rectal bleeding Heme Positive Stools  The various methods of treatment have been discussed with the patient and family. After consideration of risks, benefits and other options for treatment, the patient has consented to  Procedure(s): COLONOSCOPY WITH PROPOFOL (N/A) ESOPHAGOGASTRODUODENOSCOPY (EGD) WITH PROPOFOL (N/A) as a surgical intervention .  The patient's history has been reviewed, patient examined, no change in status, stable for surgery.  I have reviewed the patient's chart and labs.  Questions were answered to the patient's satisfaction.     Pricilla Riffle. Fuller Plan

## 2015-02-24 ENCOUNTER — Encounter (HOSPITAL_COMMUNITY): Payer: Self-pay | Admitting: Gastroenterology

## 2015-02-24 ENCOUNTER — Encounter: Payer: Self-pay | Admitting: Gastroenterology

## 2015-02-24 LAB — HM COLONOSCOPY

## 2015-02-24 NOTE — Anesthesia Postprocedure Evaluation (Signed)
  Anesthesia Post-op Note  Patient: [name]  Procedure(s) Performed: Procedure(s): COLONOSCOPY WITH PROPOFOL (N/A) ESOPHAGOGASTRODUODENOSCOPY (EGD) WITH PROPOFOL (N/A)  Patient Location: PACU  Anesthesia Type:MAC  Level of Consciousness: awake, alert  and oriented  Airway and Oxygen Therapy: Patient Spontanous Breathing  Post-op Pain: none  Post-op Assessment: Post-op Vital signs reviewed              Post-op Vital Signs: Reviewed  Last Vitals:  Filed Vitals:   02/23/15 1520  BP: 202/60  Pulse: 69  Temp:   Resp: 17    Complications: No apparent anesthesia complications

## 2015-03-25 DIAGNOSIS — J449 Chronic obstructive pulmonary disease, unspecified: Secondary | ICD-10-CM | POA: Diagnosis not present

## 2015-04-02 ENCOUNTER — Other Ambulatory Visit: Payer: Self-pay | Admitting: Internal Medicine

## 2015-04-05 ENCOUNTER — Other Ambulatory Visit (HOSPITAL_BASED_OUTPATIENT_CLINIC_OR_DEPARTMENT_OTHER): Payer: Medicare Other

## 2015-04-05 DIAGNOSIS — D472 Monoclonal gammopathy: Secondary | ICD-10-CM | POA: Diagnosis not present

## 2015-04-05 LAB — COMPREHENSIVE METABOLIC PANEL (CC13)
ALT: 12 U/L (ref 0–55)
AST: 15 U/L (ref 5–34)
Albumin: 3.5 g/dL (ref 3.5–5.0)
Alkaline Phosphatase: 96 U/L (ref 40–150)
Anion Gap: 9 mEq/L (ref 3–11)
BUN: 24.3 mg/dL (ref 7.0–26.0)
CO2: 31 mEq/L — ABNORMAL HIGH (ref 22–29)
CREATININE: 1.5 mg/dL — AB (ref 0.6–1.1)
Calcium: 10 mg/dL (ref 8.4–10.4)
Chloride: 100 mEq/L (ref 98–109)
EGFR: 35 mL/min/{1.73_m2} — ABNORMAL LOW (ref >90)
Glucose: 100 mg/dl (ref 70–140)
Potassium: 4 mEq/L (ref 3.5–5.1)
Sodium: 139 mEq/L (ref 136–145)
Total Bilirubin: 0.29 mg/dL (ref 0.20–1.20)
Total Protein: 7.4 g/dL (ref 6.4–8.3)

## 2015-04-05 LAB — CBC WITH DIFFERENTIAL/PLATELET
BASO%: 0.3 % (ref 0.0–2.0)
Basophils Absolute: 0 10*3/uL (ref 0.0–0.1)
EOS%: 3.2 % (ref 0.0–7.0)
Eosinophils Absolute: 0.4 10*3/uL (ref 0.0–0.5)
HCT: 30.3 % — ABNORMAL LOW (ref 34.8–46.6)
HGB: 9.2 g/dL — ABNORMAL LOW (ref 11.6–15.9)
LYMPH%: 19.2 % (ref 14.0–49.7)
MCH: 24.1 pg — ABNORMAL LOW (ref 25.1–34.0)
MCHC: 30.4 g/dL — AB (ref 31.5–36.0)
MCV: 79.5 fL (ref 79.5–101.0)
MONO#: 1.1 10*3/uL — AB (ref 0.1–0.9)
MONO%: 9.1 % (ref 0.0–14.0)
NEUT#: 7.9 10*3/uL — ABNORMAL HIGH (ref 1.5–6.5)
NEUT%: 68.2 % (ref 38.4–76.8)
Platelets: 265 10*3/uL (ref 145–400)
RBC: 3.81 10*6/uL (ref 3.70–5.45)
RDW: 19.7 % — ABNORMAL HIGH (ref 11.2–14.5)
WBC: 11.5 10*3/uL — AB (ref 3.9–10.3)
lymph#: 2.2 10*3/uL (ref 0.9–3.3)
nRBC: 0 % (ref 0–0)

## 2015-04-07 LAB — SPEP & IFE WITH QIG
ABNORMAL PROTEIN BAND1: 0.7 g/dL
ALPHA-2-GLOBULIN: 0.9 g/dL (ref 0.5–0.9)
Albumin ELP: 3.6 g/dL — ABNORMAL LOW (ref 3.8–4.8)
Alpha-1-Globulin: 0.4 g/dL — ABNORMAL HIGH (ref 0.2–0.3)
BETA GLOBULIN: 0.5 g/dL (ref 0.4–0.6)
Beta 2: 0.4 g/dL (ref 0.2–0.5)
Gamma Globulin: 1.4 g/dL (ref 0.8–1.7)
IGA: 140 mg/dL (ref 69–380)
IGG (IMMUNOGLOBIN G), SERUM: 1330 mg/dL (ref 690–1700)
IgM, Serum: 56 mg/dL (ref 52–322)
TOTAL PROTEIN, SERUM ELECTROPHOR: 7.1 g/dL (ref 6.1–8.1)

## 2015-04-07 LAB — KAPPA/LAMBDA LIGHT CHAINS
KAPPA FREE LGHT CHN: 37.2 mg/dL — AB (ref 0.33–1.94)
Kappa:Lambda Ratio: 15.12 — ABNORMAL HIGH (ref 0.26–1.65)
Lambda Free Lght Chn: 2.46 mg/dL (ref 0.57–2.63)

## 2015-04-12 ENCOUNTER — Telehealth: Payer: Self-pay | Admitting: Oncology

## 2015-04-12 ENCOUNTER — Ambulatory Visit (HOSPITAL_BASED_OUTPATIENT_CLINIC_OR_DEPARTMENT_OTHER): Payer: Medicare Other | Admitting: Oncology

## 2015-04-12 VITALS — BP 139/47 | HR 70 | Temp 97.9°F | Resp 18 | Ht 60.0 in | Wt 229.2 lb

## 2015-04-12 DIAGNOSIS — D472 Monoclonal gammopathy: Secondary | ICD-10-CM

## 2015-04-12 NOTE — Progress Notes (Signed)
Hematology and Oncology Follow Up Visit  [name] [id number] 1940/10/06 74 y.o. 04/12/2015 1:19 PM [name], MDJones, Laurie Right, MD   Principle Diagnosis: 74 year old woman with IgG kappa monoclonal gammopathy  representing MGUS without any evidence of end organ damage. This was diagnosed in August of 2014. She presented with an M spike of about 1 g/dL on a mildly elevated IgG level of 1800.  Current therapy: Observation and surveillance.  Interim History: [name] presents today for a followup visit. Since her last visit, she was hospitalized in April 2016 for bronchitis and COPD exacerbation. Since that time, she had not had any hospitalization or illnesses. She continues to be oxygen dependent and breathing relatively stable. She has diffuse bone discomfort that have been chronic in nature but her skeletal survey did not show any evidence of any bony lesions. She has not reported any opportunistic infections or pathological fractures. Her health status continue to be around baseline.   She does not report any headaches or blurry vision or syncope. She does not report any fevers, chills, sweats or weight loss. Her mobility and performance status is unchanged. Does not report any chest pain or difficulty breathing. She has not reported any nausea, vomiting or abdominal pain. Has not reported any urinary complaints. Rest of her review of systems unremarkable.  Medications: I have reviewed the patient's current medications.  Current Outpatient Prescriptions  Medication Sig Dispense Refill  . acetaminophen (TYLENOL) 325 MG tablet Take 2 tablets (650 mg total) by mouth every 6 (six) hours as needed for mild pain or headache (or Fever >/= 101).    Marland Kitchen albuterol (PROAIR HFA) 108 (90 BASE) MCG/ACT inhaler Inhale 1-2 puffs into the lungs every 6 (six) hours as needed for wheezing or shortness of breath. (Patient taking differently: Inhale 1-2 puffs into the lungs every 6 (six) hours as needed for  wheezing or shortness of breath. Patient take once a day instead of three times a day.) 18 g 11  . albuterol (PROVENTIL) (2.5 MG/3ML) 0.083% nebulizer solution Take 3 mLs (2.5 mg total) by nebulization every 6 (six) hours as needed for wheezing or shortness of breath. (Patient taking differently: Take 2.5 mg by nebulization every 6 (six) hours as needed for wheezing or shortness of breath. Patient only takes once a day instead of three times a day) 150 mL 11  . allopurinol (ZYLOPRIM) 100 MG tablet TAKE 1 TABLET BY MOUTH EVERY DAY FOR GOUT. 90 tablet 1  . aspirin 81 MG EC tablet Take 81 mg by mouth daily.      . colchicine (COLCRYS) 0.6 MG tablet Take 1 tablet (0.6 mg total) by mouth 2 (two) times daily. (Patient not taking: Reported on 04/12/2015) 60 tablet 5  . Dietary Management Product (VASCULERA) TABS TAKE 1 TABLET BY MOUTH EVERY DAY. (Patient taking differently: Take 1 tablet by mouth every day.) 30 tablet 11  . Fluticasone Furoate-Vilanterol (BREO ELLIPTA) 100-25 MCG/INH AEPB Inhale 1 puff into the lungs daily. 30 each 11  . furosemide (LASIX) 40 MG tablet TAKE 1 TABLET BY MOUTH TWICE DAILY. 180 tablet 1  . ketoconazole (NIZORAL) 2 % cream Apply 1 application topically 2 (two) times daily. (Patient not taking: Reported on 04/12/2015) 60 g 5  . losartan (COZAAR) 100 MG tablet TAKE 1 TABLET BY MOUTH EVERY DAY. 90 tablet 3  . metoprolol (LOPRESSOR) 50 MG tablet TAKE 1 TABLET BY MOUTH TWICE DAILY. 180 tablet 3  . nystatin (MYCOSTATIN) powder Use as directed twice per  day as needed (Patient not taking: Reported on 04/12/2015) 60 g 1  . omeprazole (PRILOSEC) 40 MG capsule Take 1 tablet 30 min before breakfast. 90 capsule 3  . Vitamin D, Ergocalciferol, (DRISDOL) 50000 UNITS CAPS capsule TAKE 1 CAPSULE BY MOUTH ONCE A WEEK (Patient taking differently: Take 1 capsule by mouth once a week.) 12 capsule 3  . [DISCONTINUED] olmesartan-hydrochlorothiazide (BENICAR HCT) 40-25 MG per tablet Take 1 tablet by mouth  daily. 90 tablet 3   No current facility-administered medications for this visit.     Allergies:  Allergies  Allergen Reactions  . Celebrex [Celecoxib]     edema  . Enalapril Cough  . Lipitor [Atorvastatin]     Muscle aches  . Amlodipine Besylate     REACTION: edema  . Amoxicillin     REACTION: Diarrhea  . Codeine Sulfate     REACTION: Nausea  . Hydrocodone-Acetaminophen     REACTION: Nausea    Past Medical History, Surgical history, Social history, and Family History were reviewed and updated.  Physical Exam: Blood pressure 139/47, pulse 70, temperature 97.9 F (36.6 C), temperature source Oral, resp. rate 18, height 5' (1.524 m), weight 229 lb 3.2 oz (103.964 kg), SpO2 100 %. ECOG: 1 General appearance: alert and cooperative. Chronically ill-appearing using oxygen via nasal cannula. Head: Normocephalic, without obvious abnormality Neck: no adenopathy Lymph nodes: Cervical, supraclavicular, and axillary nodes normal. Heart:regular rate and rhythm, S1, S2 normal, no murmur, click, rub or gallop Lung:chest clear, no wheezing, rales, normal symmetric air entry Abdomin: soft, non-tender, without masses or organomegaly EXT:no erythema, induration, or nodules. Lateral lower extremity edema noted.   Lab Results: Lab Results  Component Value Date   WBC 11.5* 04/05/2015   HGB 9.2* 04/05/2015   HCT 30.3* 04/05/2015   MCV 79.5 04/05/2015   PLT 265 04/05/2015     Chemistry      Component Value Date/Time   NA 139 04/05/2015 1302   NA 140 01/26/2015 1049   K 4.0 04/05/2015 1302   K 4.2 01/26/2015 1049   CL 99 01/26/2015 1049   CO2 31* 04/05/2015 1302   CO2 33* 01/26/2015 1049   BUN 24.3 04/05/2015 1302   BUN 44* 01/26/2015 1049   CREATININE 1.5* 04/05/2015 1302   CREATININE 1.47* 01/26/2015 1049      Component Value Date/Time   CALCIUM 10.0 04/05/2015 1302   CALCIUM 9.8 01/26/2015 1049   ALKPHOS 96 04/05/2015 1302   ALKPHOS 61 11/17/2014 0338   AST 15  04/05/2015 1302   AST 24 11/17/2014 0338   ALT 12 04/05/2015 1302   ALT 14 11/17/2014 0338   BILITOT 0.29 04/05/2015 1302   BILITOT 0.3 11/17/2014 0338      Results for Laurie, [name] (MRN [id number]) as of 04/12/2015 13:00  Ref. Range 07/07/2014 14:46 04/05/2015 13:02  Gamma Globulin Latest Ref Range: 0.8-1.7 g/dL 23.2 (H) 1.4  M-SPIKE, % Latest Units: g/dL 1.07   SPE Interp. Unknown * *  IgG (Immunoglobin G), Serum Latest Ref Range: [phone] mg/dL 1790 (H) 1330  IgA Latest Ref Range: 69-380 mg/dL 130 140  IgM, Serum Latest Ref Range: 52-322 mg/dL 47 (L) 56  Total Protein, Serum Electrophoresis Latest Ref Range: 6.1-8.1 g/dL 7.2 7.1  Kappa free light chain Latest Ref Range: 0.33-1.94 mg/dL 69.70 (H) 37.20 (H)  Lambda Free Lght Chn Latest Ref Range: 0.57-2.63 mg/dL 1.70 2.46  Kappa:Lambda Ratio Latest Ref Range: 0.26-1.65  41.00 (H) 15.12 (H)      Impression  and Plan:  74 year old woman with the following issues:  1. IgG kappa monoclonal protein represents MGUS without any evidence to suggest end organ damage or multiple myeloma diagnosed in 2014. Her protein studies from 04/05/2015 were reviewed today and showed that her M spike, IgG level and Kappa light chains all have declined. These findings do not support multiple myeloma at this time and likely MGUS. The plan is continued observation and repeat protein studies in 12 months.  2. Diffuse pain: Under related to her plasma cell disorder more likely related to arthritis at this time.  3. Renal insufficiency: Her creatinine is close to baseline around 1.5 and not related to a plasma cell disorder.   [facility], MD 9/13/20161:19 PM

## 2015-04-12 NOTE — Telephone Encounter (Signed)
Pt confirmed labs/ov per 09/13 POF, gave pt AVS and Calendar.... KJ

## 2015-04-18 ENCOUNTER — Encounter: Payer: Self-pay | Admitting: Internal Medicine

## 2015-04-18 ENCOUNTER — Ambulatory Visit (INDEPENDENT_AMBULATORY_CARE_PROVIDER_SITE_OTHER): Payer: Medicare Other | Admitting: Internal Medicine

## 2015-04-18 VITALS — BP 132/58 | HR 60 | Temp 97.9°F | Resp 16 | Ht 60.0 in | Wt 229.0 lb

## 2015-04-18 DIAGNOSIS — Z23 Encounter for immunization: Secondary | ICD-10-CM

## 2015-04-18 DIAGNOSIS — I10 Essential (primary) hypertension: Secondary | ICD-10-CM | POA: Diagnosis not present

## 2015-04-18 DIAGNOSIS — D509 Iron deficiency anemia, unspecified: Secondary | ICD-10-CM

## 2015-04-18 DIAGNOSIS — D519 Vitamin B12 deficiency anemia, unspecified: Secondary | ICD-10-CM | POA: Diagnosis not present

## 2015-04-18 MED ORDER — FERIVA 21/7 75-1 MG PO TABS: 1.0000 | ORAL_TABLET | Freq: Every day | ORAL | Status: DC

## 2015-04-18 NOTE — Patient Instructions (Signed)
Iron Deficiency Anemia Anemia is a condition in which there are less red blood cells or hemoglobin in the blood than normal. Hemoglobin is the part of red blood cells that carries oxygen. Iron deficiency anemia is anemia caused by too little iron. It is the most common type of anemia. It may leave you tired and short of breath. CAUSES   Lack of iron in the diet.  Poor absorption of iron, as seen with intestinal disorders.  Intestinal bleeding.  Heavy periods. SIGNS AND SYMPTOMS  Mild anemia may not be noticeable. Symptoms may include:  Fatigue.  Headache.  Pale skin.  Weakness.  Tiredness.  Shortness of breath.  Dizziness.  Cold hands and feet.  Fast or irregular heartbeat. DIAGNOSIS  Diagnosis requires a thorough evaluation and physical exam by your health care provider. Blood tests are generally used to confirm iron deficiency anemia. Additional tests may be done to find the underlying cause of your anemia. These may include:  Testing for blood in the stool (fecal occult blood test).  A procedure to see inside the colon and rectum (colonoscopy).  A procedure to see inside the esophagus and stomach (endoscopy). TREATMENT  Iron deficiency anemia is treated by correcting the cause of the deficiency. Treatment may involve:  Adding iron-rich foods to your diet.  Taking iron supplements. Pregnant or breastfeeding women need to take extra iron because their normal diet usually does not provide the required amount.  Taking vitamins. Vitamin C improves the absorption of iron. Your health care provider may recommend that you take your iron tablets with a glass of orange juice or vitamin C supplement.  Medicines to make heavy menstrual flow lighter.  Surgery. HOME CARE INSTRUCTIONS   Take iron as directed by your health care provider.  If you cannot tolerate taking iron supplements by mouth, talk to your health care provider about taking them through a vein  (intravenously) or an injection into a muscle.  For the best iron absorption, iron supplements should be taken on an empty stomach. If you cannot tolerate them on an empty stomach, you may need to take them with food.  Do not drink milk or take antacids at the same time as your iron supplements. Milk and antacids may interfere with the absorption of iron.  Iron supplements can cause constipation. Make sure to include fiber in your diet to prevent constipation. A stool softener may also be recommended.  Take vitamins as directed by your health care provider.  Eat a diet rich in iron. Foods high in iron include liver, lean beef, whole-grain bread, eggs, dried fruit, and dark green leafy vegetables. SEEK IMMEDIATE MEDICAL CARE IF:   You faint. If this happens, do not drive. Call your local emergency services (911 in [place] if no other help is available.  You have chest pain.  You feel nauseous or vomit.  You have severe or increased shortness of breath with activity.  You feel weak.  You have a rapid heartbeat.  You have unexplained sweating.  You become light-headed when getting up from a chair or bed. MAKE SURE YOU:   Understand these instructions.  Will watch your condition.  Will get help right away if you are not doing well or get worse. Document Released: 07/13/2000 Document Revised: 07/21/2013 Document Reviewed: 03/23/2013 Exit[employer] Patient Information 2015 [employer]. This information is not intended to replace advice given to you by your health care provider. Make sure you discuss any questions you have with your health care provider.  

## 2015-04-18 NOTE — Progress Notes (Signed)
Pre visit review using our clinic review tool, if applicable. No additional management support is needed unless otherwise documented below in the visit note. 

## 2015-04-18 NOTE — Progress Notes (Signed)
Subjective:  Patient ID: [name], female    DOB: 06-Jun-1941  Age: 74 y.o. MRN: [id number]  CC: Hypertension and Anemia   HPI [name] presents for follow-up. She complains of a persistent sensation of feeling weak, shaky, and short of breath. Her edema is no worse than usual. She is taking a very low dose iron supplement and doesn't feel like it's helping. She recently saw gastroenterology and was told that she had leaky blood vessels in her intestines and she needed to take iron therapy.  Outpatient Prescriptions Prior to Visit  Medication Sig Dispense Refill  . acetaminophen (TYLENOL) 325 MG tablet Take 2 tablets (650 mg total) by mouth every 6 (six) hours as needed for mild pain or headache (or Fever >/= 101).    Marland Kitchen albuterol (PROAIR HFA) 108 (90 BASE) MCG/ACT inhaler Inhale 1-2 puffs into the lungs every 6 (six) hours as needed for wheezing or shortness of breath. (Patient taking differently: Inhale 1-2 puffs into the lungs every 6 (six) hours as needed for wheezing or shortness of breath. Patient take once a day instead of three times a day.) 18 g 11  . albuterol (PROVENTIL) (2.5 MG/3ML) 0.083% nebulizer solution Take 3 mLs (2.5 mg total) by nebulization every 6 (six) hours as needed for wheezing or shortness of breath. (Patient taking differently: Take 2.5 mg by nebulization every 6 (six) hours as needed for wheezing or shortness of breath. Patient only takes once a day instead of three times a day) 150 mL 11  . allopurinol (ZYLOPRIM) 100 MG tablet TAKE 1 TABLET BY MOUTH EVERY DAY FOR GOUT. 90 tablet 1  . aspirin 81 MG EC tablet Take 81 mg by mouth daily.      . colchicine (COLCRYS) 0.6 MG tablet Take 1 tablet (0.6 mg total) by mouth 2 (two) times daily. 60 tablet 5  . Dietary Management Product (VASCULERA) TABS TAKE 1 TABLET BY MOUTH EVERY DAY. (Patient taking differently: Take 1 tablet by mouth every day.) 30 tablet 11  . Fluticasone Furoate-Vilanterol (BREO ELLIPTA) 100-25  MCG/INH AEPB Inhale 1 puff into the lungs daily. 30 each 11  . furosemide (LASIX) 40 MG tablet TAKE 1 TABLET BY MOUTH TWICE DAILY. 180 tablet 1  . ketoconazole (NIZORAL) 2 % cream Apply 1 application topically 2 (two) times daily. 60 g 5  . losartan (COZAAR) 100 MG tablet TAKE 1 TABLET BY MOUTH EVERY DAY. 90 tablet 3  . metoprolol (LOPRESSOR) 50 MG tablet TAKE 1 TABLET BY MOUTH TWICE DAILY. 180 tablet 3  . nystatin (MYCOSTATIN) powder Use as directed twice per day as needed 60 g 1  . omeprazole (PRILOSEC) 40 MG capsule Take 1 tablet 30 min before breakfast. 90 capsule 3  . Vitamin D, Ergocalciferol, (DRISDOL) 50000 UNITS CAPS capsule TAKE 1 CAPSULE BY MOUTH ONCE A WEEK (Patient taking differently: Take 1 capsule by mouth once a week.) 12 capsule 3   No facility-administered medications prior to visit.    ROS Review of Systems  Constitutional: Positive for fatigue. Negative for fever, chills, diaphoresis, activity change, appetite change and unexpected weight change.  HENT: Negative.   Eyes: Negative.   Respiratory: Positive for shortness of breath. Negative for apnea, cough, choking, chest tightness, wheezing and stridor.   Cardiovascular: Positive for leg swelling. Negative for chest pain and palpitations.  Gastrointestinal: Positive for blood in stool. Negative for nausea, vomiting, abdominal pain, diarrhea and constipation.  Endocrine: Negative.   Genitourinary: Negative.  Negative for difficulty  urinating.  Musculoskeletal: Negative.  Negative for myalgias, back pain, arthralgias and neck pain.  Skin: Negative.  Negative for color change, pallor and rash.  Allergic/Immunologic: Negative.   Neurological: Negative.  Negative for dizziness, tremors, syncope, weakness, light-headedness, numbness and headaches.  Hematological: Negative.  Negative for adenopathy. Does not bruise/bleed easily.  Psychiatric/Behavioral: Negative.     Objective:  BP 132/58 mmHg  Pulse 60  Temp(Src) 97.9 F  (36.6 C) (Oral)  Resp 16  Ht 5' (1.524 m)  Wt 229 lb (103.874 kg)  BMI 44.72 kg/m2  SpO2 99%  BP Readings from Last 3 Encounters:  04/18/15 132/58  04/12/15 139/47  02/23/15 202/60    Wt Readings from Last 3 Encounters:  04/18/15 229 lb (103.874 kg)  04/12/15 229 lb 3.2 oz (103.964 kg)  02/16/15 227 lb 12.8 oz (103.329 kg)    Physical Exam  Constitutional: She is oriented to person, place, and time. No distress.  HENT:  Mouth/Throat: Oropharynx is clear and moist. No oropharyngeal exudate.  Eyes: Conjunctivae are normal. Right eye exhibits no discharge. Left eye exhibits no discharge. No scleral icterus.  Neck: Normal range of motion. Neck supple. No JVD present. No tracheal deviation present. No thyromegaly present.  Cardiovascular: Normal rate, regular rhythm, normal heart sounds and intact distal pulses.  Exam reveals no gallop and no friction rub.   No murmur heard. Pulmonary/Chest: Effort normal and breath sounds normal. No stridor. No respiratory distress. She has no wheezes. She has no rales. She exhibits no tenderness.  Abdominal: Soft. Bowel sounds are normal. She exhibits no distension and no mass. There is no tenderness. There is no rebound and no guarding.  Musculoskeletal: Normal range of motion. She exhibits edema (trace pitting edema in BLE). She exhibits no tenderness.  Lymphadenopathy:    She has no cervical adenopathy.  Neurological: She is oriented to person, place, and time.  Skin: Skin is warm and dry. No rash noted. She is not diaphoretic. No erythema. No pallor.  Vitals reviewed.   Lab Results  Component Value Date   WBC 11.5* 04/05/2015   HGB 9.2* 04/05/2015   HCT 30.3* 04/05/2015   PLT 265 04/05/2015   GLUCOSE 100 04/05/2015   CHOL 164 11/25/2014   TRIG 140.0 11/25/2014   HDL 53.40 11/25/2014   LDLDIRECT 135.3 11/20/2010   LDLCALC 83 11/25/2014   ALT 12 04/05/2015   AST 15 04/05/2015   NA 139 04/05/2015   K 4.0 04/05/2015   CL 99  01/26/2015   CREATININE 1.5* 04/05/2015   BUN 24.3 04/05/2015   CO2 31* 04/05/2015   TSH 1.03 11/25/2014   HGBA1C 6.3* 11/16/2014   MICROALBUR 15.9* 02/05/2013    No results found.  Assessment & Plan:   [name] was seen today for hypertension and anemia.  Diagnoses and all orders for this visit:  Essential hypertension- her blood pressure is well-controlled, she has a normal volume status today she will continue taking losartan, metoprolol, Lasix for high blood pressure.  Vitamin B12 deficiency anemia- she will start a supplement for this -     FeAsp-B12-FA-C-DSS-SuccAc-Zn (FERIVA 21/7) 75-1 MG TABS; Take 1 tablet by mouth daily.  Anemia, iron deficiency- I will start treating this with Feriva -     FeAsp-B12-FA-C-DSS-SuccAc-Zn (FERIVA 21/7) 75-1 MG TABS; Take 1 tablet by mouth daily.   I am having Ms. Capito start on [place] 21/7. I am also having her maintain her aspirin, albuterol, Vitamin D (Ergocalciferol), VASCULERA, ketoconazole, acetaminophen, nystatin, albuterol, furosemide, metoprolol,  colchicine, Fluticasone Furoate-Vilanterol, allopurinol, omeprazole, losartan, and Ferrous Sulfate.  Meds ordered this encounter  Medications  . Ferrous Sulfate 28 MG TABS    Sig: Take 28 mg by mouth daily.  . FeAsp-B12-FA-C-DSS-SuccAc-Zn (FERIVA 21/7) 75-1 MG TABS    Sig: Take 1 tablet by mouth daily.    Dispense:  28 tablet    Refill:  11     Follow-up: No Follow-up on file.  [name], MD

## 2015-04-21 ENCOUNTER — Other Ambulatory Visit: Payer: Self-pay | Admitting: Internal Medicine

## 2015-04-25 DIAGNOSIS — J449 Chronic obstructive pulmonary disease, unspecified: Secondary | ICD-10-CM | POA: Diagnosis not present

## 2015-05-25 DIAGNOSIS — J449 Chronic obstructive pulmonary disease, unspecified: Secondary | ICD-10-CM | POA: Diagnosis not present

## 2015-06-20 ENCOUNTER — Other Ambulatory Visit: Payer: Self-pay | Admitting: Internal Medicine

## 2015-06-25 DIAGNOSIS — J449 Chronic obstructive pulmonary disease, unspecified: Secondary | ICD-10-CM | POA: Diagnosis not present

## 2015-07-05 ENCOUNTER — Ambulatory Visit (INDEPENDENT_AMBULATORY_CARE_PROVIDER_SITE_OTHER): Payer: Medicare Other | Admitting: Internal Medicine

## 2015-07-05 ENCOUNTER — Encounter: Payer: Self-pay | Admitting: Internal Medicine

## 2015-07-05 ENCOUNTER — Ambulatory Visit (INDEPENDENT_AMBULATORY_CARE_PROVIDER_SITE_OTHER)
Admission: RE | Admit: 2015-07-05 | Discharge: 2015-07-05 | Disposition: A | Discharge status: Home / Self Care | Payer: Medicare Other | Source: Ambulatory Visit | Attending: Internal Medicine | Admitting: Internal Medicine

## 2015-07-05 VITALS — BP 136/70 | HR 71 | Temp 98.3°F | Resp 16 | Ht 60.0 in | Wt 230.0 lb

## 2015-07-05 DIAGNOSIS — J189 Pneumonia, unspecified organism: Secondary | ICD-10-CM

## 2015-07-05 DIAGNOSIS — R059 Cough, unspecified: Secondary | ICD-10-CM

## 2015-07-05 DIAGNOSIS — R05 Cough: Secondary | ICD-10-CM | POA: Diagnosis not present

## 2015-07-05 DIAGNOSIS — R0602 Shortness of breath: Secondary | ICD-10-CM | POA: Diagnosis not present

## 2015-07-05 MED ORDER — LEVOFLOXACIN 750 MG PO TABS: 750.0000 mg | ORAL_TABLET | Freq: Every day | ORAL | Status: AC

## 2015-07-05 MED ORDER — PROMETHAZINE-DM 6.25-15 MG/5ML PO SYRP: 5.0000 mL | ORAL_SOLUTION | Freq: Four times a day (QID) | ORAL | Status: DC | PRN

## 2015-07-05 NOTE — Patient Instructions (Signed)

## 2015-07-05 NOTE — Progress Notes (Signed)
Pre visit review using our clinic review tool, if applicable. No additional management support is needed unless otherwise documented below in the visit note. 

## 2015-07-05 NOTE — Progress Notes (Signed)
Subjective:  Patient ID: [name], female    DOB: 1941-03-21  Age: 74 y.o. MRN: [id number]  CC: Cough and Sore Throat   HPI [name] presents for a cough productive of thick yellow/green phlegm for 1 week.  Outpatient Prescriptions Prior to Visit  Medication Sig Dispense Refill  . acetaminophen (TYLENOL) 325 MG tablet Take 2 tablets (650 mg total) by mouth every 6 (six) hours as needed for mild pain or headache (or Fever >/= 101).    Marland Kitchen albuterol (PROAIR HFA) 108 (90 BASE) MCG/ACT inhaler Inhale 1-2 puffs into the lungs every 6 (six) hours as needed for wheezing or shortness of breath. (Patient taking differently: Inhale 1-2 puffs into the lungs every 6 (six) hours as needed for wheezing or shortness of breath. Patient take once a day instead of three times a day.) 18 g 11  . albuterol (PROVENTIL) (2.5 MG/3ML) 0.083% nebulizer solution Take 3 mLs (2.5 mg total) by nebulization every 6 (six) hours as needed for wheezing or shortness of breath. (Patient taking differently: Take 2.5 mg by nebulization every 6 (six) hours as needed for wheezing or shortness of breath. Patient only takes once a day instead of three times a day) 150 mL 11  . allopurinol (ZYLOPRIM) 100 MG tablet TAKE 1 TABLET BY MOUTH EVERY DAY FOR GOUT. 90 tablet 1  . aspirin 81 MG EC tablet Take 81 mg by mouth daily.      . colchicine (COLCRYS) 0.6 MG tablet Take 1 tablet (0.6 mg total) by mouth 2 (two) times daily. 60 tablet 5  . Dietary Management Product (VASCULERA) TABS TAKE 1 TABLET BY MOUTH EVERY DAY. 30 tablet 11  . FeAsp-B12-FA-C-DSS-SuccAc-Zn (FERIVA 21/7) 75-1 MG TABS Take 1 tablet by mouth daily. 28 tablet 11  . Ferrous Sulfate 28 MG TABS Take 28 mg by mouth daily.    . Fluticasone Furoate-Vilanterol (BREO ELLIPTA) 100-25 MCG/INH AEPB Inhale 1 puff into the lungs daily. 30 each 11  . furosemide (LASIX) 40 MG tablet TAKE 1 TABLET BY MOUTH TWICE DAILY. 180 tablet 1  . ketoconazole (NIZORAL) 2 % cream Apply 1  application topically 2 (two) times daily. 60 g 5  . losartan (COZAAR) 100 MG tablet TAKE 1 TABLET BY MOUTH EVERY DAY. 90 tablet 3  . metoprolol (LOPRESSOR) 50 MG tablet TAKE 1 TABLET BY MOUTH TWICE DAILY. 180 tablet 3  . nystatin (MYCOSTATIN) powder Use as directed twice per day as needed 60 g 1  . omeprazole (PRILOSEC) 40 MG capsule Take 1 tablet 30 min before breakfast. 90 capsule 3  . Vitamin D, Ergocalciferol, (DRISDOL) 50000 UNITS CAPS capsule TAKE ONE CAPSULE BY MOUTH ONCE WEEKLY 12 capsule 0   No facility-administered medications prior to visit.    ROS Review of Systems  Constitutional: Negative.   HENT: Positive for rhinorrhea and sinus pressure. Negative for congestion, facial swelling, postnasal drip, sneezing, sore throat, tinnitus, trouble swallowing and voice change.   Eyes: Negative.   Respiratory: Positive for cough. Negative for apnea, choking, chest tightness, shortness of breath, wheezing and stridor.   Cardiovascular: Negative.  Negative for chest pain, palpitations and leg swelling.  Gastrointestinal: Negative.  Negative for nausea, vomiting, abdominal pain, diarrhea, constipation and blood in stool.  Endocrine: Negative.   Genitourinary: Negative.   Musculoskeletal: Negative.  Negative for myalgias, back pain, joint swelling and arthralgias.  Skin: Negative.  Negative for color change and rash.  Allergic/Immunologic: Negative.   Neurological: Negative.   Hematological: Negative.  Negative for adenopathy. Does not bruise/bleed easily.  Psychiatric/Behavioral: Negative.     Objective:  BP 136/70 mmHg  Pulse 71  Temp(Src) 98.3 F (36.8 C) (Oral)  Resp 16  Ht 5' (1.524 m)  Wt 230 lb (104.327 kg)  BMI 44.92 kg/m2  SpO2 97%  BP Readings from Last 3 Encounters:  07/05/15 136/70  04/18/15 132/58  04/12/15 139/47    Wt Readings from Last 3 Encounters:  07/05/15 230 lb (104.327 kg)  04/18/15 229 lb (103.874 kg)  04/12/15 229 lb 3.2 oz (103.964 kg)     Physical Exam  Constitutional: She is oriented to person, place, and time. No distress.  HENT:  Head: Normocephalic and atraumatic.  Mouth/Throat: Oropharynx is clear and moist. No oropharyngeal exudate.  Eyes: Conjunctivae are normal. Right eye exhibits no discharge. Left eye exhibits no discharge. No scleral icterus.  Neck: Normal range of motion. Neck supple. No JVD present. No tracheal deviation present. No thyromegaly present.  Cardiovascular: Normal rate, regular rhythm, normal heart sounds and intact distal pulses.  Exam reveals no gallop and no friction rub.   No murmur heard. Pulmonary/Chest: Effort normal and breath sounds normal. No accessory muscle usage or stridor. No tachypnea. No respiratory distress. She has no decreased breath sounds. She has no wheezes. She has no rhonchi. She has no rales. She exhibits no tenderness.  Abdominal: Soft. Bowel sounds are normal. She exhibits no distension and no mass. There is no tenderness. There is no rebound and no guarding.  Musculoskeletal: Normal range of motion. She exhibits no edema or tenderness.  Lymphadenopathy:    She has no cervical adenopathy.  Neurological: She is oriented to person, place, and time.  Skin: Skin is warm and dry. No rash noted. She is not diaphoretic. No erythema. No pallor.  Vitals reviewed.   Lab Results  Component Value Date   WBC 11.5* 04/05/2015   HGB 9.2* 04/05/2015   HCT 30.3* 04/05/2015   PLT 265 04/05/2015   GLUCOSE 100 04/05/2015   CHOL 164 11/25/2014   TRIG 140.0 11/25/2014   HDL 53.40 11/25/2014   LDLDIRECT 135.3 11/20/2010   LDLCALC 83 11/25/2014   ALT 12 04/05/2015   AST 15 04/05/2015   NA 139 04/05/2015   K 4.0 04/05/2015   CL 99 01/26/2015   CREATININE 1.5* 04/05/2015   BUN 24.3 04/05/2015   CO2 31* 04/05/2015   TSH 1.03 11/25/2014   HGBA1C 6.3* 11/16/2014   MICROALBUR 15.9* 02/05/2013    No results found.  Assessment & Plan:   [name] was seen today for cough and sore  throat.  Diagnoses and all orders for this visit:  Cough- her CXR is negative for PNA. Mass, edema -     promethazine-dextromethorphan (PROMETHAZINE-DM) 6.25-15 MG/5ML syrup; Take 5 mLs by mouth 4 (four) times daily as needed for cough. -     DG Chest 2 View; Future  Bilateral pneumonia- she has s/s c/w PNA and or infectious exacerbation of COPD, will treat this with a cough suppressant and a quinolone antibiotic, her lungs are CTA today so I do not recommend systemic steroids  -     promethazine-dextromethorphan (PROMETHAZINE-DM) 6.25-15 MG/5ML syrup; Take 5 mLs by mouth 4 (four) times daily as needed for cough. -     levofloxacin (LEVAQUIN) 750 MG tablet; Take 1 tablet (750 mg total) by mouth daily. -     DG Chest 2 View; Future  I am having [name] start on promethazine-dextromethorphan and levofloxacin. I am  also having her maintain her aspirin, albuterol, ketoconazole, acetaminophen, nystatin, albuterol, furosemide, metoprolol, colchicine, Fluticasone Furoate-Vilanterol, allopurinol, omeprazole, losartan, Ferrous Sulfate, FERIVA 21/7, Vitamin D (Ergocalciferol), and VASCULERA.  Meds ordered this encounter  Medications  . promethazine-dextromethorphan (PROMETHAZINE-DM) 6.25-15 MG/5ML syrup    Sig: Take 5 mLs by mouth 4 (four) times daily as needed for cough.    Dispense:  118 mL    Refill:  0  . levofloxacin (LEVAQUIN) 750 MG tablet    Sig: Take 1 tablet (750 mg total) by mouth daily.    Dispense:  7 tablet    Refill:  0     Follow-up: Return in about 3 weeks (around 07/26/2015).  [name], MD

## 2015-07-16 ENCOUNTER — Other Ambulatory Visit: Payer: Self-pay | Admitting: Internal Medicine

## 2015-07-18 ENCOUNTER — Other Ambulatory Visit: Payer: Self-pay | Admitting: Internal Medicine

## 2015-07-25 DIAGNOSIS — J449 Chronic obstructive pulmonary disease, unspecified: Secondary | ICD-10-CM | POA: Diagnosis not present

## 2015-08-17 ENCOUNTER — Ambulatory Visit (INDEPENDENT_AMBULATORY_CARE_PROVIDER_SITE_OTHER): Payer: Medicare Other | Admitting: Internal Medicine

## 2015-08-17 ENCOUNTER — Encounter: Payer: Self-pay | Admitting: Internal Medicine

## 2015-08-17 ENCOUNTER — Other Ambulatory Visit: Payer: Self-pay | Admitting: Internal Medicine

## 2015-08-17 ENCOUNTER — Other Ambulatory Visit (INDEPENDENT_AMBULATORY_CARE_PROVIDER_SITE_OTHER): Payer: Medicare Other

## 2015-08-17 VITALS — BP 118/60 | HR 90 | Temp 97.5°F | Resp 16 | Ht 60.0 in | Wt 235.0 lb

## 2015-08-17 DIAGNOSIS — J988 Other specified respiratory disorders: Secondary | ICD-10-CM | POA: Insufficient documentation

## 2015-08-17 DIAGNOSIS — J449 Chronic obstructive pulmonary disease, unspecified: Secondary | ICD-10-CM

## 2015-08-17 DIAGNOSIS — D519 Vitamin B12 deficiency anemia, unspecified: Secondary | ICD-10-CM

## 2015-08-17 DIAGNOSIS — J069 Acute upper respiratory infection, unspecified: Secondary | ICD-10-CM

## 2015-08-17 DIAGNOSIS — D509 Iron deficiency anemia, unspecified: Secondary | ICD-10-CM

## 2015-08-17 DIAGNOSIS — I10 Essential (primary) hypertension: Secondary | ICD-10-CM

## 2015-08-17 DIAGNOSIS — B9789 Other viral agents as the cause of diseases classified elsewhere: Secondary | ICD-10-CM

## 2015-08-17 DIAGNOSIS — N183 Chronic kidney disease, stage 3 unspecified: Secondary | ICD-10-CM

## 2015-08-17 DIAGNOSIS — E785 Hyperlipidemia, unspecified: Secondary | ICD-10-CM

## 2015-08-17 DIAGNOSIS — R7303 Prediabetes: Secondary | ICD-10-CM

## 2015-08-17 LAB — CBC WITH DIFFERENTIAL/PLATELET
BASOS ABS: 0.1 10*3/uL (ref 0.0–0.1)
BASOS PCT: 0.5 % (ref 0.0–3.0)
EOS ABS: 0.3 10*3/uL (ref 0.0–0.7)
Eosinophils Relative: 2.4 % (ref 0.0–5.0)
Hemoglobin: 8.3 g/dL — ABNORMAL LOW (ref 12.0–15.0)
LYMPHS PCT: 19.3 % (ref 12.0–46.0)
Lymphs Abs: 2.5 10*3/uL (ref 0.7–4.0)
MCHC: 31.8 g/dL (ref 30.0–36.0)
MCV: 76.1 fl — ABNORMAL LOW (ref 78.0–100.0)
MONO ABS: 1.2 10*3/uL — AB (ref 0.1–1.0)
Monocytes Relative: 9.3 % (ref 3.0–12.0)
NEUTROS ABS: 8.9 10*3/uL — AB (ref 1.4–7.7)
NEUTROS PCT: 68.5 % (ref 43.0–77.0)
PLATELETS: 341 10*3/uL (ref 150.0–400.0)
RBC: 3.42 Mil/uL — ABNORMAL LOW (ref 3.87–5.11)
RDW: 16 % — AB (ref 11.5–15.5)
WBC: 12.9 10*3/uL — AB (ref 4.0–10.5)

## 2015-08-17 LAB — BASIC METABOLIC PANEL
BUN: 37 mg/dL — AB (ref 6–23)
CALCIUM: 9.3 mg/dL (ref 8.4–10.5)
CO2: 31 meq/L (ref 19–32)
CREATININE: 1.64 mg/dL — AB (ref 0.40–1.20)
Chloride: 98 mEq/L (ref 96–112)
GFR: 32.45 mL/min — ABNORMAL LOW (ref >60.00)
Glucose, Bld: 112 mg/dL — ABNORMAL HIGH (ref 70–99)
Potassium: 4 mEq/L (ref 3.5–5.1)
Sodium: 137 mEq/L (ref 135–145)

## 2015-08-17 LAB — LIPID PANEL
CHOL/HDL RATIO: 4
CHOLESTEROL: 171 mg/dL (ref 0–200)
HDL: 43.7 mg/dL (ref >39.00)
LDL Cholesterol: 93 mg/dL (ref 0–99)
NONHDL: 127.37
Triglycerides: 170 mg/dL — ABNORMAL HIGH (ref 0.0–149.0)
VLDL: 34 mg/dL (ref 0.0–40.0)

## 2015-08-17 LAB — IBC PANEL
IRON: 26 ug/dL — AB (ref 42–145)
Saturation Ratios: 5.9 % — ABNORMAL LOW (ref 20.0–50.0)
TRANSFERRIN: 313 mg/dL (ref 212.0–360.0)

## 2015-08-17 LAB — FERRITIN: FERRITIN: 12.4 ng/mL (ref 10.0–291.0)

## 2015-08-17 LAB — HEMOGLOBIN A1C: HEMOGLOBIN A1C: 6.2 % (ref 4.6–6.5)

## 2015-08-17 LAB — VITAMIN B12: VITAMIN B 12: 262 pg/mL (ref 211–911)

## 2015-08-17 LAB — FOLATE: Folate: 13.9 ng/mL (ref >5.9)

## 2015-08-17 LAB — TSH: TSH: 3.62 u[IU]/mL (ref 0.35–4.50)

## 2015-08-17 MED ORDER — HYDROCOD POLST-CPM POLST ER 10-8 MG/5ML PO SUER: 5.0000 mL | Freq: Two times a day (BID) | ORAL | Status: DC | PRN

## 2015-08-17 MED ORDER — FERRALET 90 90-1 MG PO TABS: 1.0000 | ORAL_TABLET | Freq: Every day | ORAL | Status: DC

## 2015-08-17 MED ORDER — CYANOCOBALAMIN 500 MCG/0.1ML NA SOLN: 0.1000 mL | NASAL | Status: DC

## 2015-08-17 NOTE — Progress Notes (Signed)
Subjective:  Patient ID: [name], female    DOB: May 05, 1941  Age: 75 y.o. MRN: [id number]  CC: Cough   HPI SYLA DEVOSS presents for a one-week history of nonproductive cough with a scratchy throat.  Outpatient Prescriptions Prior to Visit  Medication Sig Dispense Refill  . acetaminophen (TYLENOL) 325 MG tablet Take 2 tablets (650 mg total) by mouth every 6 (six) hours as needed for mild pain or headache (or Fever >/= 101).    Marland Kitchen albuterol (PROAIR HFA) 108 (90 BASE) MCG/ACT inhaler Inhale 1-2 puffs into the lungs every 6 (six) hours as needed for wheezing or shortness of breath. (Patient taking differently: Inhale 1-2 puffs into the lungs every 6 (six) hours as needed for wheezing or shortness of breath. Patient take once a day instead of three times a day.) 18 g 11  . albuterol (PROVENTIL) (2.5 MG/3ML) 0.083% nebulizer solution Take 3 mLs (2.5 mg total) by nebulization every 6 (six) hours as needed for wheezing or shortness of breath. (Patient taking differently: Take 2.5 mg by nebulization every 6 (six) hours as needed for wheezing or shortness of breath. Patient only takes once a day instead of three times a day) 150 mL 11  . aspirin 81 MG EC tablet Take 81 mg by mouth daily.      . Dietary Management Product (VASCULERA) TABS TAKE 1 TABLET BY MOUTH EVERY DAY. 30 tablet 11  . Fluticasone Furoate-Vilanterol (BREO ELLIPTA) 100-25 MCG/INH AEPB Inhale 1 puff into the lungs daily. 30 each 11  . furosemide (LASIX) 40 MG tablet TAKE 1 TABLET BY MOUTH TWICE DAILY 180 tablet 1  . ketoconazole (NIZORAL) 2 % cream Apply 1 application topically 2 (two) times daily. 60 g 5  . losartan (COZAAR) 100 MG tablet TAKE 1 TABLET BY MOUTH EVERY DAY. 90 tablet 3  . metoprolol (LOPRESSOR) 50 MG tablet TAKE 1 TABLET BY MOUTH TWICE DAILY. 180 tablet 3  . nystatin (MYCOSTATIN) powder Use as directed twice per day as needed 60 g 1  . omeprazole (PRILOSEC) 40 MG capsule Take 1 tablet 30 min before breakfast.  90 capsule 3  . Vitamin D, Ergocalciferol, (DRISDOL) 50000 UNITS CAPS capsule TAKE ONE CAPSULE BY MOUTH ONCE WEEKLY 12 capsule 3  . allopurinol (ZYLOPRIM) 100 MG tablet TAKE 1 TABLET BY MOUTH EVERY DAY FOR GOUT. 90 tablet 1  . colchicine (COLCRYS) 0.6 MG tablet Take 1 tablet (0.6 mg total) by mouth 2 (two) times daily. (Patient not taking: Reported on 08/17/2015) 60 tablet 5  . FeAsp-B12-FA-C-DSS-SuccAc-Zn (FERIVA 21/7) 75-1 MG TABS Take 1 tablet by mouth daily. 28 tablet 11  . Ferrous Sulfate 28 MG TABS Take 28 mg by mouth daily.    . promethazine-dextromethorphan (PROMETHAZINE-DM) 6.25-15 MG/5ML syrup Take 5 mLs by mouth 4 (four) times daily as needed for cough. 118 mL 0   No facility-administered medications prior to visit.    ROS Review of Systems  Constitutional: Negative.  Negative for fever, chills, diaphoresis, appetite change and fatigue.  HENT: Positive for sore throat. Negative for congestion, ear pain, facial swelling, sinus pressure, trouble swallowing and voice change.   Eyes: Negative.   Respiratory: Positive for cough. Negative for apnea, choking, chest tightness, shortness of breath, wheezing and stridor.   Cardiovascular: Negative.  Negative for chest pain, palpitations and leg swelling.  Gastrointestinal: Negative.  Negative for nausea, vomiting, abdominal pain, diarrhea and constipation.  Endocrine: Negative.   Genitourinary: Negative.  Negative for difficulty urinating.  Musculoskeletal: Negative.  Negative for myalgias, back pain and arthralgias.  Skin: Negative.  Negative for color change and rash.  Allergic/Immunologic: Negative.   Neurological: Negative.   Hematological: Negative.  Negative for adenopathy. Does not bruise/bleed easily.  Psychiatric/Behavioral: Negative.     Objective:  BP 118/60 mmHg  Pulse 90  Temp(Src) 97.5 F (36.4 C) (Oral)  Resp 16  Ht 5' (1.524 m)  Wt 235 lb (106.595 kg)  BMI 45.90 kg/m2  SpO2 97%  BP Readings from Last 3  Encounters:  08/17/15 118/60  07/05/15 136/70  04/18/15 132/58    Wt Readings from Last 3 Encounters:  08/17/15 235 lb (106.595 kg)  07/05/15 230 lb (104.327 kg)  04/18/15 229 lb (103.874 kg)    Physical Exam  Constitutional: She is oriented to person, place, and time.  Non-toxic appearance. She does not have a sickly appearance. She does not appear ill. No distress.  HENT:  Mouth/Throat: Oropharynx is clear and moist. No oropharyngeal exudate.  Eyes: Conjunctivae are normal. Right eye exhibits no discharge. Left eye exhibits no discharge. No scleral icterus.  Neck: Normal range of motion. Neck supple. No JVD present. No tracheal deviation present. No thyromegaly present.  Cardiovascular: Normal rate, regular rhythm, normal heart sounds and intact distal pulses.  Exam reveals no gallop and no friction rub.   No murmur heard. Pulmonary/Chest: Effort normal. No accessory muscle usage or stridor. No tachypnea. No respiratory distress. She has decreased breath sounds in the right upper field, the right middle field, the right lower field, the left upper field, the left middle field and the left lower field. She has no wheezes. She has no rhonchi. She has no rales. She exhibits no tenderness.  Abdominal: Soft. Bowel sounds are normal. She exhibits no distension and no mass. There is no tenderness. There is no rebound and no guarding.  Musculoskeletal: Normal range of motion. She exhibits no edema or tenderness.  Lymphadenopathy:    She has no cervical adenopathy.  Neurological: She is oriented to person, place, and time.  Skin: Skin is warm and dry. No rash noted. She is not diaphoretic. No erythema. No pallor.    Lab Results  Component Value Date   WBC 12.9* 08/17/2015   HGB 8.3 Repeated and verified X2.* 08/17/2015   HCT 26.0 Repeated and verified X2.* 08/17/2015   PLT 341.0 08/17/2015   GLUCOSE 112* 08/17/2015   CHOL 171 08/17/2015   TRIG 170.0* 08/17/2015   HDL 43.70 08/17/2015     LDLDIRECT 135.3 11/20/2010   LDLCALC 93 08/17/2015   ALT 12 04/05/2015   AST 15 04/05/2015   NA 137 08/17/2015   K 4.0 08/17/2015   CL 98 08/17/2015   CREATININE 1.64* 08/17/2015   BUN 37* 08/17/2015   CO2 31 08/17/2015   TSH 3.62 08/17/2015   HGBA1C 6.2 08/17/2015   MICROALBUR 15.9* 02/05/2013    Dg Chest 2 View  07/05/2015  CLINICAL DATA:  Cough, congestion, shortness of breath and mid chest pain for 10 days, former smoking history EXAM: CHEST  2 VIEW COMPARISON:  Portable chest x-ray of 11/20/2014 and two-view chest x-ray of 11/16/2014 FINDINGS: No active infiltrate or effusion is seen. Mediastinal and hilar contours are unremarkable. The heart remains mildly enlarged and stable. No bony abnormality is seen. IMPRESSION: No definite active process.  Borderline cardiomegaly is stable. Electronically Signed   By: Ivar Drape M.D.   On: 07/05/2015 12:20    Assessment & Plan:   Cherye was seen today for cough.  Diagnoses and all orders for this visit:  Vitamin B12 deficiency anemia- she has worsening anemia and B12 deficiency, will start B12 nasal spray -     CBC with Differential/Platelet; Future -     Vitamin B12; Future -     Folate; Future -     Cyanocobalamin 500 MCG/0.1ML SOLN; Place 0.1 mLs (500 mcg total) into the nose once a week. -     Fe Cbn-Fe Gluc-FA-B12-C-DSS (FERRALET 90) 90-1 MG TABS; Take 1 tablet by mouth daily.  Essential hypertension- blood pressure is well-controlled, her electrolytes and renal function are stable. -     Basic metabolic panel; Future  CKD (chronic kidney disease), stage III- her renal function is stable, she will continue to avoid nephrotoxic agents. -     Basic metabolic panel; Future  Prediabetes -     Hemoglobin A1c; Future  Microcytic anemia- she has iron deficiency anemia, we'll start an oral iron supplement. -     CBC with Differential/Platelet; Future -     IBC panel; Future -     Ferritin; Future -     Folate; Future -      Fe Cbn-Fe Gluc-FA-B12-C-DSS (FERRALET 90) 90-1 MG TABS; Take 1 tablet by mouth daily.  Hyperlipidemia with target LDL less than 100- he is achieved her LDL goal -     Lipid panel; Future -     TSH; Future  Chronic obstructive pulmonary disease, unspecified COPD type ([place])- this is stable, will continue Breo -     chlorpheniramine-HYDROcodone (TUSSIONEX PENNKINETIC ER) 10-8 MG/5ML SUER; Take 5 mLs by mouth every 12 (twelve) hours as needed for cough.  Viral URI with cough -     chlorpheniramine-HYDROcodone (TUSSIONEX PENNKINETIC ER) 10-8 MG/5ML SUER; Take 5 mLs by mouth every 12 (twelve) hours as needed for cough.   I have discontinued [name]'s colchicine, Ferrous Sulfate, FERIVA 21/7, and promethazine-dextromethorphan. I am also having her start on chlorpheniramine-HYDROcodone, Cyanocobalamin, and FERRALET 90. Additionally, I am having her maintain her aspirin, albuterol, ketoconazole, acetaminophen, nystatin, albuterol, metoprolol, Fluticasone Furoate-Vilanterol, omeprazole, losartan, VASCULERA, Vitamin D (Ergocalciferol), and furosemide.  Meds ordered this encounter  Medications  . chlorpheniramine-HYDROcodone (TUSSIONEX PENNKINETIC ER) 10-8 MG/5ML SUER    Sig: Take 5 mLs by mouth every 12 (twelve) hours as needed for cough.    Dispense:  140 mL    Refill:  0  . Cyanocobalamin 500 MCG/0.1ML SOLN    Sig: Place 0.1 mLs (500 mcg total) into the nose once a week.    Dispense:  2.3 mL    Refill:  11  . Fe Cbn-Fe Gluc-FA-B12-C-DSS (FERRALET 90) 90-1 MG TABS    Sig: Take 1 tablet by mouth daily.    Dispense:  30 each    Refill:  11     Follow-up: Return in about 3 weeks (around 09/07/2015).  [name], MD

## 2015-08-17 NOTE — Progress Notes (Signed)
Pre visit review using our clinic review tool, if applicable. No additional management support is needed unless otherwise documented below in the visit note. 

## 2015-08-17 NOTE — Patient Instructions (Signed)

## 2015-08-25 ENCOUNTER — Telehealth: Payer: Self-pay

## 2015-08-25 DIAGNOSIS — J449 Chronic obstructive pulmonary disease, unspecified: Secondary | ICD-10-CM | POA: Diagnosis not present

## 2015-08-25 NOTE — Telephone Encounter (Signed)
Alternative

## 2015-08-25 NOTE — Telephone Encounter (Signed)
covermymeds [employer]

## 2015-08-25 NOTE — Telephone Encounter (Signed)
PA denied. Formulay exclusion from [employer] coverage

## 2015-09-19 ENCOUNTER — Other Ambulatory Visit: Payer: Self-pay

## 2015-09-19 NOTE — Patient Outreach (Signed)
[facility]) Care Management  09/19/2015  [name] [birth year] [id number]   Telephone call attempt to patient for referral from [facility] High Risk COPD.  No answer.  HIPAA compliant voice message left.    Plan: RN Health Coach will attempt within 1-2 weeks.    Jone Baseman, RN, MSN [place] [phone]

## 2015-09-21 ENCOUNTER — Other Ambulatory Visit: Payer: Self-pay

## 2015-09-21 NOTE — Patient Outreach (Signed)
[place] [facility]) Care Management  09/21/2015  [name] Jul 23, 1941 [id number]   Referral Date: 09-12-15 Referral Source: [facility] Referral Reason: High Risk COPD Outreach Attempt: Second outreach successful Social: Patient lives with spouse.  She reports she is independent with activities of daily living.  However, she does need assistance with housekeeping and grocery shopping.  Conditions:  Patient admits to anemia, COPD, and CHF. Patient currently reports some problems with anemia but is working on that with her physician.  Patient states she is oxygen dependent since last year.  Patient states she manages her COPD and CHF.  Patient states she is familiar with zone charts.    Medications: Patient reports she has no problems with her medications and verbalizes no questions.    Services:  Patient has declined services as she reports she feels she knows what she needs to do to maintain her health.  Plan:  RN Health Coach will send patient letter and brochure speaking to phone call to today. RN Health Coach will send letter to physician notifying of patient declination of services.    Jone Baseman, RN, MSN [place] [phone]

## 2015-09-21 NOTE — Patient Outreach (Signed)
[facility] [facility]) Care Management  09/21/2015  [name] [birth year] [id number]   2nd telephone call to patient for referral. No answer.  HIPAA compliant voice message left at home number and cell number.    Plan: RN Health Coach will contact patient within 1-2 weeks.    Jone Baseman, RN, MSN [place] [phone]

## 2015-09-21 NOTE — Progress Notes (Signed)
This encounter was created in error - please disregard.

## 2015-09-25 DIAGNOSIS — J449 Chronic obstructive pulmonary disease, unspecified: Secondary | ICD-10-CM | POA: Diagnosis not present

## 2015-10-23 DIAGNOSIS — J449 Chronic obstructive pulmonary disease, unspecified: Secondary | ICD-10-CM | POA: Diagnosis not present

## 2015-11-23 DIAGNOSIS — J449 Chronic obstructive pulmonary disease, unspecified: Secondary | ICD-10-CM | POA: Diagnosis not present

## 2015-12-23 DIAGNOSIS — J449 Chronic obstructive pulmonary disease, unspecified: Secondary | ICD-10-CM | POA: Diagnosis not present

## 2016-01-17 ENCOUNTER — Other Ambulatory Visit: Payer: Self-pay | Admitting: Internal Medicine

## 2016-01-19 ENCOUNTER — Other Ambulatory Visit: Payer: Self-pay | Admitting: Internal Medicine

## 2016-01-23 DIAGNOSIS — J449 Chronic obstructive pulmonary disease, unspecified: Secondary | ICD-10-CM | POA: Diagnosis not present

## 2016-02-17 ENCOUNTER — Other Ambulatory Visit: Payer: Self-pay | Admitting: Internal Medicine

## 2016-02-22 DIAGNOSIS — J449 Chronic obstructive pulmonary disease, unspecified: Secondary | ICD-10-CM | POA: Diagnosis not present

## 2016-02-23 ENCOUNTER — Encounter: Payer: Self-pay | Admitting: Internal Medicine

## 2016-02-23 ENCOUNTER — Ambulatory Visit (INDEPENDENT_AMBULATORY_CARE_PROVIDER_SITE_OTHER)
Admission: RE | Admit: 2016-02-23 | Discharge: 2016-02-23 | Disposition: A | Discharge status: Home / Self Care | Payer: Medicare Other | Source: Ambulatory Visit | Attending: Internal Medicine | Admitting: Internal Medicine

## 2016-02-23 ENCOUNTER — Other Ambulatory Visit (INDEPENDENT_AMBULATORY_CARE_PROVIDER_SITE_OTHER): Payer: Medicare Other

## 2016-02-23 ENCOUNTER — Ambulatory Visit (INDEPENDENT_AMBULATORY_CARE_PROVIDER_SITE_OTHER): Payer: Medicare Other | Admitting: Internal Medicine

## 2016-02-23 ENCOUNTER — Other Ambulatory Visit: Payer: Self-pay | Admitting: Internal Medicine

## 2016-02-23 VITALS — BP 124/62 | HR 72 | Temp 98.2°F | Resp 16 | Wt 241.0 lb

## 2016-02-23 DIAGNOSIS — M25551 Pain in right hip: Secondary | ICD-10-CM

## 2016-02-23 DIAGNOSIS — I872 Venous insufficiency (chronic) (peripheral): Secondary | ICD-10-CM

## 2016-02-23 DIAGNOSIS — N183 Chronic kidney disease, stage 3 unspecified: Secondary | ICD-10-CM

## 2016-02-23 DIAGNOSIS — M25559 Pain in unspecified hip: Secondary | ICD-10-CM | POA: Insufficient documentation

## 2016-02-23 DIAGNOSIS — R7303 Prediabetes: Secondary | ICD-10-CM | POA: Diagnosis not present

## 2016-02-23 DIAGNOSIS — D519 Vitamin B12 deficiency anemia, unspecified: Secondary | ICD-10-CM

## 2016-02-23 DIAGNOSIS — M1000 Idiopathic gout, unspecified site: Secondary | ICD-10-CM

## 2016-02-23 DIAGNOSIS — M1611 Unilateral primary osteoarthritis, right hip: Secondary | ICD-10-CM | POA: Diagnosis not present

## 2016-02-23 DIAGNOSIS — D472 Monoclonal gammopathy: Secondary | ICD-10-CM

## 2016-02-23 DIAGNOSIS — D509 Iron deficiency anemia, unspecified: Secondary | ICD-10-CM

## 2016-02-23 DIAGNOSIS — L308 Other specified dermatitis: Secondary | ICD-10-CM

## 2016-02-23 DIAGNOSIS — I8310 Varicose veins of unspecified lower extremity with inflammation: Secondary | ICD-10-CM

## 2016-02-23 LAB — CBC WITH DIFFERENTIAL/PLATELET
BASOS ABS: 0 10*3/uL (ref 0.0–0.1)
BASOS PCT: 0.4 % (ref 0.0–3.0)
EOS ABS: 0.4 10*3/uL (ref 0.0–0.7)
Eosinophils Relative: 3.9 % (ref 0.0–5.0)
HCT: 25 % — ABNORMAL LOW (ref 36.0–46.0)
LYMPHS PCT: 18.2 % (ref 12.0–46.0)
Lymphs Abs: 1.9 10*3/uL (ref 0.7–4.0)
MCHC: 32.5 g/dL (ref 30.0–36.0)
MCV: 71.6 fl — ABNORMAL LOW (ref 78.0–100.0)
MONO ABS: 1.1 10*3/uL — AB (ref 0.1–1.0)
Monocytes Relative: 10.1 % (ref 3.0–12.0)
Neutro Abs: 7.1 10*3/uL (ref 1.4–7.7)
Neutrophils Relative %: 67.4 % (ref 43.0–77.0)
PLATELETS: 295 10*3/uL (ref 150.0–400.0)
RBC: 3.49 Mil/uL — ABNORMAL LOW (ref 3.87–5.11)
RDW: 17.6 % — AB (ref 11.5–15.5)
WBC: 10.6 10*3/uL — AB (ref 4.0–10.5)

## 2016-02-23 LAB — BASIC METABOLIC PANEL
BUN: 25 mg/dL — ABNORMAL HIGH (ref 6–23)
CHLORIDE: 103 meq/L (ref 96–112)
CO2: 30 mEq/L (ref 19–32)
Calcium: 9.5 mg/dL (ref 8.4–10.5)
Creatinine, Ser: 1.27 mg/dL — ABNORMAL HIGH (ref 0.40–1.20)
GFR: 43.53 mL/min — AB (ref >60.00)
Glucose, Bld: 104 mg/dL — ABNORMAL HIGH (ref 70–99)
POTASSIUM: 3.6 meq/L (ref 3.5–5.1)
SODIUM: 140 meq/L (ref 135–145)

## 2016-02-23 LAB — IBC PANEL
IRON: 16 ug/dL — AB (ref 42–145)
SATURATION RATIOS: 4.1 % — AB (ref 20.0–50.0)
TRANSFERRIN: 276 mg/dL (ref 212.0–360.0)

## 2016-02-23 LAB — FOLATE: Folate: 16 ng/mL (ref >5.9)

## 2016-02-23 LAB — FERRITIN: Ferritin: 13.9 ng/mL (ref 10.0–291.0)

## 2016-02-23 LAB — VITAMIN B12: VITAMIN B 12: 215 pg/mL (ref 211–911)

## 2016-02-23 LAB — HEMOGLOBIN A1C: HEMOGLOBIN A1C: 6.1 % (ref 4.6–6.5)

## 2016-02-23 LAB — URIC ACID: Uric Acid, Serum: 9.3 mg/dL — ABNORMAL HIGH (ref 2.4–7.0)

## 2016-02-23 MED ORDER — VASCULERA PO TABS: 1.0000 | ORAL_TABLET | Freq: Every day | ORAL | 11 refills | Status: DC

## 2016-02-23 NOTE — Progress Notes (Signed)
Subjective:  Patient ID: [name], female    DOB: 06-25-1941  Age: 75 y.o. MRN: [id number]  CC: Anemia   HPI [name] presents for follow-up on multiple medical problems. She has a history of anemia and tells me she has not been compliant with her iron and B12 therapy because she felt they were too expensive. She did not inform me of this. She is not aware of any blood loss but she does complain of fatigue. She has had no recent episodes of lymphadenopathy, rash, bruising, or bleeding.  She also complains of a 3 week history of right-sided hip pain with no preceding trauma or injury. She takes Motrin and Tylenol for symptom relief. The pain does not radiate into her lower extremities and she denies paresthesias.  He also complains of intermittent episodes of rash and itching on both upper arms.  Outpatient Medications Prior to Visit  Medication Sig Dispense Refill  . acetaminophen (TYLENOL) 325 MG tablet Take 2 tablets (650 mg total) by mouth every 6 (six) hours as needed for mild pain or headache (or Fever >/= 101).    Marland Kitchen albuterol (PROAIR HFA) 108 (90 BASE) MCG/ACT inhaler Inhale 1-2 puffs into the lungs every 6 (six) hours as needed for wheezing or shortness of breath. (Patient taking differently: Inhale 1-2 puffs into the lungs every 6 (six) hours as needed for wheezing or shortness of breath. Patient take once a day instead of three times a day.) 18 g 11  . albuterol (PROVENTIL) (2.5 MG/3ML) 0.083% nebulizer solution Take 3 mLs (2.5 mg total) by nebulization every 6 (six) hours as needed for wheezing or shortness of breath. (Patient taking differently: Take 2.5 mg by nebulization every 6 (six) hours as needed for wheezing or shortness of breath. Patient only takes once a day instead of three times a day) 150 mL 11  . allopurinol (ZYLOPRIM) 100 MG tablet TAKE 1 TABLET BY MOUTH EVERY DAY FOR GOUT 90 tablet 0  . aspirin 81 MG EC tablet Take 81 mg by mouth daily.      .  Fluticasone Furoate-Vilanterol (BREO ELLIPTA) 100-25 MCG/INH AEPB Inhale 1 puff into the lungs daily. 30 each 11  . furosemide (LASIX) 40 MG tablet TAKE 1 TABLET BY MOUTH TWICE DAILY 180 tablet 0  . ketoconazole (NIZORAL) 2 % cream Apply 1 application topically 2 (two) times daily. 60 g 5  . losartan (COZAAR) 100 MG tablet TAKE 1 TABLET BY MOUTH EVERY DAY. 90 tablet 3  . metoprolol (LOPRESSOR) 50 MG tablet TAKE 1 TABLET BY MOUTH TWICE DAILY 180 tablet 1  . nystatin (MYCOSTATIN) powder Use as directed twice per day as needed 60 g 1  . omeprazole (PRILOSEC) 40 MG capsule Take 1 tablet 30 min before breakfast. 90 capsule 3  . Vitamin D, Ergocalciferol, (DRISDOL) 50000 UNITS CAPS capsule TAKE ONE CAPSULE BY MOUTH ONCE WEEKLY 12 capsule 3  . chlorpheniramine-HYDROcodone (TUSSIONEX PENNKINETIC ER) 10-8 MG/5ML SUER Take 5 mLs by mouth every 12 (twelve) hours as needed for cough. 140 mL 0  . Cyanocobalamin 500 MCG/0.1ML SOLN Place 0.1 mLs (500 mcg total) into the nose once a week. (Patient not taking: Reported on 02/23/2016) 2.3 mL 11  . Dietary Management Product (VASCULERA) TABS TAKE 1 TABLET BY MOUTH EVERY DAY. (Patient not taking: Reported on 02/23/2016) 30 tablet 11  . Fe Cbn-Fe Gluc-FA-B12-C-DSS (FERRALET 90) 90-1 MG TABS Take 1 tablet by mouth daily. (Patient not taking: Reported on 02/23/2016) 30 each 11  No facility-administered medications prior to visit.     ROS Review of Systems  Constitutional: Positive for fatigue. Negative for activity change, appetite change and fever.  HENT: Negative.  Negative for sinus pressure and trouble swallowing.   Eyes: Negative.  Negative for visual disturbance.  Respiratory: Negative for cough, choking, chest tightness, shortness of breath and stridor.   Cardiovascular: Negative.  Negative for chest pain, palpitations and leg swelling.  Gastrointestinal: Negative.  Negative for abdominal pain, anal bleeding, blood in stool, constipation, diarrhea, nausea,  rectal pain and vomiting.  Endocrine: Negative.   Genitourinary: Negative.   Musculoskeletal: Positive for arthralgias. Negative for back pain, joint swelling, myalgias and neck pain.  Skin: Positive for rash. Negative for color change.  Allergic/Immunologic: Negative.   Neurological: Negative.  Negative for dizziness, weakness, light-headedness, numbness and headaches.  Hematological: Negative.  Negative for adenopathy. Does not bruise/bleed easily.  Psychiatric/Behavioral: Negative.     Objective:  BP 124/62   Pulse 72   Temp 98.2 F (36.8 C) (Oral)   Resp 16   Wt 241 lb (109.3 kg)   SpO2 95%   BMI 47.07 kg/m   BP Readings from Last 3 Encounters:  02/23/16 124/62  08/17/15 118/60  07/05/15 136/70    Wt Readings from Last 3 Encounters:  02/23/16 241 lb (109.3 kg)  08/17/15 235 lb (106.6 kg)  07/05/15 230 lb (104.3 kg)    Physical Exam  Constitutional: She is oriented to person, place, and time. No distress.  HENT:  Head: Normocephalic and atraumatic.  Mouth/Throat: Oropharynx is clear and moist. No oropharyngeal exudate.  Eyes: Conjunctivae are normal. Right eye exhibits no discharge. Left eye exhibits no discharge. No scleral icterus.  Neck: Normal range of motion. Neck supple. No JVD present. No tracheal deviation present. No thyromegaly present.  Cardiovascular: Normal rate, regular rhythm, normal heart sounds and intact distal pulses.  Exam reveals no gallop and no friction rub.   No murmur heard. Pulmonary/Chest: Effort normal and breath sounds normal. No stridor. No respiratory distress. She has no wheezes. She has no rales. She exhibits no tenderness.  Abdominal: Soft. Bowel sounds are normal. She exhibits no distension and no mass. There is no tenderness. There is no rebound and no guarding.  Musculoskeletal: Normal range of motion. She exhibits no edema or tenderness.       Right hip: Normal. She exhibits normal range of motion, normal strength, no tenderness,  no bony tenderness, no swelling and no deformity.  Lymphadenopathy:    She has no cervical adenopathy.  Neurological: She is oriented to person, place, and time.  Skin: Skin is warm and dry. Rash noted. Rash is maculopapular. She is not diaphoretic. No erythema. No pallor.  Over both upper extremities there is diffuse scale with macules and papules. There are no plaques, no significant hyperpigmentation and no vesicles or pustules.  The ulcer on her right lower extremity is healed and there are no new lesions on her lower extremities suspicious for complications of venous insufficiency.  Vitals reviewed.   Lab Results  Component Value Date   WBC 10.6 (H) 02/23/2016   HGB 8.1 Repeated and verified X2. (L) 02/23/2016   HCT 25.0 Repeated and verified X2. (L) 02/23/2016   PLT 295.0 02/23/2016   GLUCOSE 104 (H) 02/23/2016   CHOL 171 08/17/2015   TRIG 170.0 (H) 08/17/2015   HDL 43.70 08/17/2015   LDLDIRECT 135.3 11/20/2010   LDLCALC 93 08/17/2015   ALT 12 04/05/2015   AST 15  04/05/2015   NA 140 02/23/2016   K 3.6 02/23/2016   CL 103 02/23/2016   CREATININE 1.27 (H) 02/23/2016   BUN 25 (H) 02/23/2016   CO2 30 02/23/2016   TSH 3.62 08/17/2015   HGBA1C 6.1 02/23/2016   MICROALBUR 15.9 (H) 02/05/2013    Dg Chest 2 View  Result Date: 07/05/2015 CLINICAL DATA:  Cough, congestion, shortness of breath and mid chest pain for 10 days, former smoking history EXAM: CHEST  2 VIEW COMPARISON:  Portable chest x-ray of 11/20/2014 and two-view chest x-ray of 11/16/2014 FINDINGS: No active infiltrate or effusion is seen. Mediastinal and hilar contours are unremarkable. The heart remains mildly enlarged and stable. No bony abnormality is seen. IMPRESSION: No definite active process.  Borderline cardiomegaly is stable. Electronically Signed   By: Ivar Drape M.D.   On: 07/05/2015 12:20    Assessment & Plan:   Takya was seen today for anemia.  Diagnoses and all orders for this visit:  Anemia,  iron deficiency- this has worsened due to her noncompliance, will restart iron replacement therapy -     CBC with Differential/Platelet; Future -     IBC panel; Future -     Ferritin; Future -     ferrous sulfate 325 (65 FE) MG tablet; Take 1 tablet (325 mg total) by mouth 2 (two) times daily with a meal.  CKD (chronic kidney disease), stage III- her renal function is stable, I've asked her to avoid nephrotoxic agents. -     Basic metabolic panel; Future  Monoclonal paraproteinemia- will recheck her serum protein electrophoresis to see if she has developed lymphoproliferative disease. -     CBC with Differential/Platelet; Future -     Protein electrophoresis, serum; Future  Prediabetes- her A1c is 6.1%, no medications are needed, she will work on her lifestyle modifications to lower blood sugar. -     Basic metabolic panel; Future -     Hemoglobin A1c; Future  Vitamin B12 deficiency anemia- this is worsened, I have asked her to restart vitamin B12 replacement therapy. Since she is not willing to pay for the nasal spray will start a high-dose oral supplement. -     CBC with Differential/Platelet; Future -     Vitamin B12; Future -     Folate; Future -     cyanocobalamin 2000 MCG tablet; Take 1 tablet (2,000 mcg total) by mouth daily.  Idiopathic gout, unspecified chronicity, unspecified site- her uric acid level is high, vascular restart allopurinol -     Uric acid; Future  Venous stasis dermatitis, unspecified laterality- improvement noted, will continue Vasculera -     Dietary Management Product (VASCULERA) TABS; Take 1 tablet by mouth daily.  Hip pain, right- exam is unremarkable and plain film shows degenerative joint disease, will refer to orthopedics if she prefers. -     Cancel: DG HIP UNILAT WITH PELVIS MIN 4 VIEWS RIGHT; Future  Asteatotic eczema- will treat with low potency topical steroids. -     triamcinolone cream (KENALOG) 0.5 %; Apply 1 application topically 3 (three)  times daily.   I have discontinued Ms. Vivian's chlorpheniramine-HYDROcodone, Cyanocobalamin, and FERRALET 90. I have also changed her VASCULERA. Additionally, I am having her start on cyanocobalamin, ferrous sulfate, and triamcinolone cream. Lastly, I am having her maintain her aspirin, albuterol, ketoconazole, acetaminophen, nystatin, albuterol, fluticasone furoate-vilanterol, omeprazole, losartan, Vitamin D (Ergocalciferol), furosemide, metoprolol, and allopurinol.  Meds ordered this encounter  Medications  . Dietary Management Product (VASCULERA) TABS  Sig: Take 1 tablet by mouth daily.    Dispense:  30 tablet    Refill:  11  . cyanocobalamin 2000 MCG tablet    Sig: Take 1 tablet (2,000 mcg total) by mouth daily.    Dispense:  90 tablet    Refill:  3  . ferrous sulfate 325 (65 FE) MG tablet    Sig: Take 1 tablet (325 mg total) by mouth 2 (two) times daily with a meal.    Dispense:  60 tablet    Refill:  11  . triamcinolone cream (KENALOG) 0.5 %    Sig: Apply 1 application topically 3 (three) times daily.    Dispense:  100 g    Refill:  1     Follow-up: Return in about 4 weeks (around 03/22/2016).  [name], MD

## 2016-02-23 NOTE — Patient Instructions (Signed)

## 2016-02-23 NOTE — Progress Notes (Signed)
Pre visit review using our clinic review tool, if applicable. No additional management support is needed unless otherwise documented below in the visit note. 

## 2016-02-24 ENCOUNTER — Encounter: Payer: Self-pay | Admitting: Internal Medicine

## 2016-02-24 MED ORDER — FERROUS SULFATE 325 (65 FE) MG PO TABS: 325.0000 mg | ORAL_TABLET | Freq: Two times a day (BID) | ORAL | 11 refills | Status: DC

## 2016-02-24 MED ORDER — CYANOCOBALAMIN 2000 MCG PO TABS: 2000.0000 ug | ORAL_TABLET | Freq: Every day | ORAL | 3 refills | Status: DC

## 2016-02-25 DIAGNOSIS — L308 Other specified dermatitis: Secondary | ICD-10-CM | POA: Insufficient documentation

## 2016-02-25 HISTORY — DX: Other specified dermatitis: L30.8

## 2016-02-25 MED ORDER — TRIAMCINOLONE ACETONIDE 0.5 % EX CREA: 1.0000 "application " | TOPICAL_CREAM | Freq: Three times a day (TID) | CUTANEOUS | 1 refills | Status: DC

## 2016-02-27 ENCOUNTER — Other Ambulatory Visit: Payer: Self-pay | Admitting: Internal Medicine

## 2016-02-27 DIAGNOSIS — D472 Monoclonal gammopathy: Secondary | ICD-10-CM

## 2016-02-27 LAB — PROTEIN ELECTROPHORESIS, SERUM
ABNORMAL PROTEIN BAND1: 1.2 g/dL
Albumin ELP: 3.3 g/dL — ABNORMAL LOW (ref 3.8–4.8)
Alpha-1-Globulin: 0.4 g/dL — ABNORMAL HIGH (ref 0.2–0.3)
Alpha-2-Globulin: 0.9 g/dL (ref 0.5–0.9)
BETA 2: 0.3 g/dL (ref 0.2–0.5)
BETA GLOBULIN: 0.5 g/dL (ref 0.4–0.6)
Gamma Globulin: 1.7 g/dL (ref 0.8–1.7)
Total Protein, Serum Electrophoresis: 7.1 g/dL (ref 6.1–8.1)

## 2016-02-28 ENCOUNTER — Other Ambulatory Visit: Payer: Self-pay | Admitting: Oncology

## 2016-02-28 DIAGNOSIS — D509 Iron deficiency anemia, unspecified: Secondary | ICD-10-CM

## 2016-02-28 DIAGNOSIS — D519 Vitamin B12 deficiency anemia, unspecified: Secondary | ICD-10-CM

## 2016-02-29 ENCOUNTER — Telehealth: Payer: Self-pay | Admitting: Emergency Medicine

## 2016-02-29 NOTE — Telephone Encounter (Signed)
Pt called for lab results. Please give her a call back. Thanks.

## 2016-02-29 NOTE — Telephone Encounter (Signed)
Spoke to patient

## 2016-03-19 ENCOUNTER — Telehealth: Payer: Self-pay | Admitting: Internal Medicine

## 2016-03-19 ENCOUNTER — Emergency Department (HOSPITAL_COMMUNITY): Payer: Medicare Other

## 2016-03-19 ENCOUNTER — Encounter (HOSPITAL_COMMUNITY): Payer: Self-pay | Admitting: Emergency Medicine

## 2016-03-19 ENCOUNTER — Inpatient Hospital Stay (HOSPITAL_COMMUNITY)
Admission: EM | Admit: 2016-03-19 | Discharge: 2016-03-22 | DRG: 378 | Disposition: A | Discharge status: Home Health | Payer: Medicare Other | Attending: Internal Medicine | Admitting: Internal Medicine

## 2016-03-19 DIAGNOSIS — Z823 Family history of stroke: Secondary | ICD-10-CM

## 2016-03-19 DIAGNOSIS — K429 Umbilical hernia without obstruction or gangrene: Secondary | ICD-10-CM | POA: Diagnosis present

## 2016-03-19 DIAGNOSIS — K5150 Left sided colitis without complications: Secondary | ICD-10-CM | POA: Diagnosis present

## 2016-03-19 DIAGNOSIS — E1122 Type 2 diabetes mellitus with diabetic chronic kidney disease: Secondary | ICD-10-CM | POA: Diagnosis present

## 2016-03-19 DIAGNOSIS — I10 Essential (primary) hypertension: Secondary | ICD-10-CM | POA: Diagnosis present

## 2016-03-19 DIAGNOSIS — K5790 Diverticulosis of intestine, part unspecified, without perforation or abscess without bleeding: Secondary | ICD-10-CM | POA: Diagnosis not present

## 2016-03-19 DIAGNOSIS — Z86010 Personal history of colonic polyps: Secondary | ICD-10-CM

## 2016-03-19 DIAGNOSIS — Z79899 Other long term (current) drug therapy: Secondary | ICD-10-CM

## 2016-03-19 DIAGNOSIS — I8312 Varicose veins of left lower extremity with inflammation: Secondary | ICD-10-CM

## 2016-03-19 DIAGNOSIS — I8311 Varicose veins of right lower extremity with inflammation: Secondary | ICD-10-CM

## 2016-03-19 DIAGNOSIS — K51911 Ulcerative colitis, unspecified with rectal bleeding: Secondary | ICD-10-CM | POA: Diagnosis not present

## 2016-03-19 DIAGNOSIS — D472 Monoclonal gammopathy: Secondary | ICD-10-CM | POA: Diagnosis present

## 2016-03-19 DIAGNOSIS — Z6841 Body Mass Index (BMI) 40.0 and over, adult: Secondary | ICD-10-CM | POA: Diagnosis not present

## 2016-03-19 DIAGNOSIS — M109 Gout, unspecified: Secondary | ICD-10-CM | POA: Diagnosis present

## 2016-03-19 DIAGNOSIS — Z8051 Family history of malignant neoplasm of kidney: Secondary | ICD-10-CM

## 2016-03-19 DIAGNOSIS — K922 Gastrointestinal hemorrhage, unspecified: Secondary | ICD-10-CM

## 2016-03-19 DIAGNOSIS — K559 Vascular disorder of intestine, unspecified: Secondary | ICD-10-CM | POA: Diagnosis present

## 2016-03-19 DIAGNOSIS — Z880 Allergy status to penicillin: Secondary | ICD-10-CM

## 2016-03-19 DIAGNOSIS — Z8049 Family history of malignant neoplasm of other genital organs: Secondary | ICD-10-CM | POA: Diagnosis not present

## 2016-03-19 DIAGNOSIS — Z87891 Personal history of nicotine dependence: Secondary | ICD-10-CM | POA: Diagnosis not present

## 2016-03-19 DIAGNOSIS — I5032 Chronic diastolic (congestive) heart failure: Secondary | ICD-10-CM | POA: Diagnosis present

## 2016-03-19 DIAGNOSIS — E785 Hyperlipidemia, unspecified: Secondary | ICD-10-CM | POA: Diagnosis not present

## 2016-03-19 DIAGNOSIS — J449 Chronic obstructive pulmonary disease, unspecified: Secondary | ICD-10-CM | POA: Diagnosis not present

## 2016-03-19 DIAGNOSIS — Z885 Allergy status to narcotic agent status: Secondary | ICD-10-CM

## 2016-03-19 DIAGNOSIS — E119 Type 2 diabetes mellitus without complications: Secondary | ICD-10-CM

## 2016-03-19 DIAGNOSIS — Z9981 Dependence on supplemental oxygen: Secondary | ICD-10-CM | POA: Diagnosis not present

## 2016-03-19 DIAGNOSIS — N183 Chronic kidney disease, stage 3 (moderate): Secondary | ICD-10-CM | POA: Diagnosis not present

## 2016-03-19 DIAGNOSIS — N179 Acute kidney failure, unspecified: Secondary | ICD-10-CM | POA: Diagnosis present

## 2016-03-19 DIAGNOSIS — I5033 Acute on chronic diastolic (congestive) heart failure: Secondary | ICD-10-CM | POA: Diagnosis present

## 2016-03-19 DIAGNOSIS — Z7982 Long term (current) use of aspirin: Secondary | ICD-10-CM

## 2016-03-19 DIAGNOSIS — I130 Hypertensive heart and chronic kidney disease with heart failure and stage 1 through stage 4 chronic kidney disease, or unspecified chronic kidney disease: Secondary | ICD-10-CM | POA: Diagnosis not present

## 2016-03-19 DIAGNOSIS — K5731 Diverticulosis of large intestine without perforation or abscess with bleeding: Secondary | ICD-10-CM | POA: Diagnosis not present

## 2016-03-19 DIAGNOSIS — I872 Venous insufficiency (chronic) (peripheral): Secondary | ICD-10-CM | POA: Diagnosis present

## 2016-03-19 DIAGNOSIS — E669 Obesity, unspecified: Secondary | ICD-10-CM | POA: Diagnosis present

## 2016-03-19 DIAGNOSIS — Z833 Family history of diabetes mellitus: Secondary | ICD-10-CM

## 2016-03-19 DIAGNOSIS — Z87442 Personal history of urinary calculi: Secondary | ICD-10-CM | POA: Diagnosis not present

## 2016-03-19 DIAGNOSIS — Z832 Family history of diseases of the blood and blood-forming organs and certain disorders involving the immune mechanism: Secondary | ICD-10-CM

## 2016-03-19 DIAGNOSIS — K219 Gastro-esophageal reflux disease without esophagitis: Secondary | ICD-10-CM | POA: Diagnosis present

## 2016-03-19 DIAGNOSIS — Z800 Family history of malignant neoplasm of digestive organs: Secondary | ICD-10-CM | POA: Diagnosis not present

## 2016-03-19 DIAGNOSIS — Z801 Family history of malignant neoplasm of trachea, bronchus and lung: Secondary | ICD-10-CM | POA: Diagnosis not present

## 2016-03-19 DIAGNOSIS — D509 Iron deficiency anemia, unspecified: Secondary | ICD-10-CM | POA: Diagnosis not present

## 2016-03-19 DIAGNOSIS — N1832 Chronic kidney disease, stage 3b: Secondary | ICD-10-CM | POA: Diagnosis present

## 2016-03-19 DIAGNOSIS — Z8249 Family history of ischemic heart disease and other diseases of the circulatory system: Secondary | ICD-10-CM

## 2016-03-19 DIAGNOSIS — G4733 Obstructive sleep apnea (adult) (pediatric): Secondary | ICD-10-CM | POA: Diagnosis present

## 2016-03-19 DIAGNOSIS — K921 Melena: Secondary | ICD-10-CM | POA: Diagnosis not present

## 2016-03-19 DIAGNOSIS — Z888 Allergy status to other drugs, medicaments and biological substances status: Secondary | ICD-10-CM

## 2016-03-19 LAB — COMPREHENSIVE METABOLIC PANEL
ALBUMIN: 3.4 g/dL — AB (ref 3.5–5.0)
ALT: 11 U/L — AB (ref 14–54)
AST: 15 U/L (ref 15–41)
Alkaline Phosphatase: 74 U/L (ref 38–126)
Anion gap: 7 (ref 5–15)
BUN: 31 mg/dL — AB (ref 6–20)
CHLORIDE: 101 mmol/L (ref 101–111)
CO2: 28 mmol/L (ref 22–32)
CREATININE: 1.39 mg/dL — AB (ref 0.44–1.00)
Calcium: 9 mg/dL (ref 8.9–10.3)
GFR calc Af Amer: 42 mL/min — ABNORMAL LOW (ref >60)
GFR, EST NON AFRICAN AMERICAN: 36 mL/min — AB (ref >60)
GLUCOSE: 133 mg/dL — AB (ref 65–99)
POTASSIUM: 4 mmol/L (ref 3.5–5.1)
SODIUM: 136 mmol/L (ref 135–145)
Total Bilirubin: 0.2 mg/dL — ABNORMAL LOW (ref 0.3–1.2)
Total Protein: 7.3 g/dL (ref 6.5–8.1)

## 2016-03-19 LAB — CBC
HCT: 24.5 % — ABNORMAL LOW (ref 36.0–46.0)
HEMATOCRIT: 25.5 % — AB (ref 36.0–46.0)
HEMOGLOBIN: 7.8 g/dL — AB (ref 12.0–15.0)
Hemoglobin: 7.6 g/dL — ABNORMAL LOW (ref 12.0–15.0)
MCH: 23.3 pg — ABNORMAL LOW (ref 26.0–34.0)
MCH: 23.3 pg — ABNORMAL LOW (ref 26.0–34.0)
MCHC: 30.6 g/dL (ref 30.0–36.0)
MCHC: 31 g/dL (ref 30.0–36.0)
MCV: 76.1 fL — AB (ref 78.0–100.0)
MCV: 75.2 fL — ABNORMAL LOW (ref 78.0–100.0)
PLATELETS: 257 10*3/uL (ref 150–400)
PLATELETS: 225 10*3/uL (ref 150–400)
RBC: 3.35 MIL/uL — AB (ref 3.87–5.11)
RBC: 3.26 MIL/uL — ABNORMAL LOW (ref 3.87–5.11)
RDW: 17 % — ABNORMAL HIGH (ref 11.5–15.5)
RDW: 16.9 % — AB (ref 11.5–15.5)
WBC: 9.7 10*3/uL (ref 4.0–10.5)
WBC: 9.1 10*3/uL (ref 4.0–10.5)

## 2016-03-19 LAB — ABO/RH: ABO/RH(D): O POS

## 2016-03-19 LAB — GLUCOSE, CAPILLARY: GLUCOSE-CAPILLARY: 106 mg/dL — AB (ref 65–99)

## 2016-03-19 LAB — POC OCCULT BLOOD, ED: FECAL OCCULT BLD: POSITIVE — AB

## 2016-03-19 MED ORDER — FAMOTIDINE IN NACL 20-0.9 MG/50ML-% IV SOLN
20.0000 mg | Freq: Once | INTRAVENOUS | Status: AC
  Administered 2016-03-19: 20 mg via INTRAVENOUS
  Filled 2016-03-19: qty 50

## 2016-03-19 MED ORDER — SODIUM CHLORIDE 0.9 % IV SOLN
INTRAVENOUS | Status: AC
  Administered 2016-03-19: 20:00:00 via INTRAVENOUS

## 2016-03-19 MED ORDER — ACETAMINOPHEN 650 MG RE SUPP: 650.0000 mg | Freq: Four times a day (QID) | RECTAL | Status: DC | PRN

## 2016-03-19 MED ORDER — ALBUTEROL SULFATE (2.5 MG/3ML) 0.083% IN NEBU: 2.5000 mg | INHALATION_SOLUTION | Freq: Four times a day (QID) | RESPIRATORY_TRACT | Status: DC | PRN

## 2016-03-19 MED ORDER — INSULIN ASPART 100 UNIT/ML ~~LOC~~ SOLN: 0.0000 [IU] | SUBCUTANEOUS | Status: DC

## 2016-03-19 MED ORDER — FLUTICASONE FUROATE-VILANTEROL 100-25 MCG/INH IN AEPB
1.0000 | INHALATION_SPRAY | Freq: Every day | RESPIRATORY_TRACT | Status: DC
  Administered 2016-03-20 – 2016-03-22 (×3): 1 via RESPIRATORY_TRACT
  Filled 2016-03-19: qty 28

## 2016-03-19 MED ORDER — ACETAMINOPHEN 325 MG PO TABS
650.0000 mg | ORAL_TABLET | Freq: Four times a day (QID) | ORAL | Status: DC | PRN
  Administered 2016-03-22: 650 mg via ORAL
  Filled 2016-03-19: qty 2

## 2016-03-19 MED ORDER — ONDANSETRON HCL 4 MG PO TABS: 4.0000 mg | ORAL_TABLET | Freq: Four times a day (QID) | ORAL | Status: DC | PRN

## 2016-03-19 MED ORDER — PANTOPRAZOLE SODIUM 40 MG PO TBEC
40.0000 mg | DELAYED_RELEASE_TABLET | Freq: Two times a day (BID) | ORAL | Status: DC
  Administered 2016-03-19 – 2016-03-22 (×6): 40 mg via ORAL
  Filled 2016-03-19 (×6): qty 1

## 2016-03-19 MED ORDER — ONDANSETRON HCL 4 MG/2ML IJ SOLN: 4.0000 mg | Freq: Four times a day (QID) | INTRAMUSCULAR | Status: DC | PRN

## 2016-03-19 MED ORDER — FLUTICASONE FUROATE-VILANTEROL 100-25 MCG/INH IN AEPB
1.0000 | INHALATION_SPRAY | Freq: Every day | RESPIRATORY_TRACT | Status: DC
  Filled 2016-03-19: qty 28

## 2016-03-19 MED ORDER — SODIUM CHLORIDE 0.9% FLUSH
3.0000 mL | Freq: Two times a day (BID) | INTRAVENOUS | Status: DC
  Administered 2016-03-20 – 2016-03-22 (×4): 3 mL via INTRAVENOUS

## 2016-03-19 MED ORDER — SODIUM CHLORIDE 0.9 % IV SOLN: INTRAVENOUS | Status: AC

## 2016-03-19 MED ORDER — ALLOPURINOL 100 MG PO TABS
100.0000 mg | ORAL_TABLET | Freq: Every evening | ORAL | Status: DC
  Administered 2016-03-19 – 2016-03-22 (×4): 100 mg via ORAL
  Filled 2016-03-19 (×4): qty 1

## 2016-03-19 MED ORDER — METOPROLOL TARTRATE 50 MG PO TABS
50.0000 mg | ORAL_TABLET | Freq: Two times a day (BID) | ORAL | Status: DC
  Administered 2016-03-19 – 2016-03-22 (×6): 50 mg via ORAL
  Filled 2016-03-19 (×6): qty 1

## 2016-03-19 MED ORDER — IOPAMIDOL (ISOVUE-300) INJECTION 61%
80.0000 mL | Freq: Once | INTRAVENOUS | Status: AC | PRN
  Administered 2016-03-19: 80 mL via INTRAVENOUS

## 2016-03-19 MED ORDER — DIATRIZOATE MEGLUMINE & SODIUM 66-10 % PO SOLN
15.0000 mL | Freq: Once | ORAL | Status: AC
  Administered 2016-03-19: 15 mL via ORAL

## 2016-03-19 NOTE — ED Triage Notes (Signed)
Patient reports six episodes of BRB in stool since 0000 today and lower abdominal pain. Denies N/V, fever. A&O x4.

## 2016-03-19 NOTE — ED Notes (Signed)
Pt advises that after midnight this date the patient started to have loose stools that appeared to have blood in same. Pt advises that she had 6 episodes of loose stools this date. Pt advised she feels weak and run down.

## 2016-03-19 NOTE — ED Provider Notes (Signed)
[facility] DEPT Provider Note   CSN: [id number] Arrival date & time: 03/19/16  1120     History   Chief Complaint Chief Complaint  Patient presents with  . Blood In Stools    HPI [name] is a 75 y.o. female with a history of ulcerative colitis and anemia. She is here with bright red blood per rectum that began about midnight this morning and is progressed to maroon and then melanotic stools. She describes the stools as "a lot of blood" and liquid. She has had associated nausea and lightheadedness. She denies chest pain or vomiting. She is having right lower quadrant pain for the past hour which she describes as "annoying but not severe". She has a chronic umbilical hernia that is tender to palpation. She has chronic edema of the lower legs due to congestive heart failure.  HPI  Past Medical History:  Diagnosis Date  . CHF (congestive heart failure) ([place]) 08/31/03  . Colonic polyp 02/16/2008   Tubular adenoma  . COPD (chronic obstructive pulmonary disease) ([place]) 01/27/98  . GERD (gastroesophageal reflux disease) 07/30/92  . Gout   . HTN (hypertension) 07/30/1988  . Hyperlipidemia 11/27/96  . Kidney stone 1960, 1972, 1991  . Renal insufficiency   . Ulcerative colitis     Patient Active Problem List   Diagnosis Date Noted  . Asteatotic eczema 02/25/2016  . Hip pain 02/23/2016  . Benign neoplasm of ascending colon   . COLD (chronic obstructive lung disease) ([place])   . Anemia, iron deficiency 11/16/2014  . Cor pulmonale ([place]) 11/16/2014  . OSA (obstructive sleep apnea) 06/22/2013  . Venous stasis dermatitis 06/02/2013  . Monoclonal paraproteinemia 02/23/2013  . Vitamin D deficiency 02/06/2013  . Osteopenia, senile 02/05/2013  . Other screening mammogram 02/05/2013  . Ulcer of leg, chronic, right ([place]) 10/10/2011  . CKD (chronic kidney disease), stage III 06/20/2010  . Morbid obesity ([place]) 04/19/2010  . Gout 01/10/2009  . ULCERATIVE COLITIS-LEFT SIDE 03/19/2008  .  Vitamin B12 deficiency anemia 11/13/2006  . Bilateral lower extremity edema 11/13/2006  . Prediabetes 11/13/2006  . Hyperlipidemia with target LDL less than 100 11/27/1996  . GERD 07/30/1992  . HTN (hypertension) 07/30/1988    Past Surgical History:  Procedure Laterality Date  . BRONCHOSCOPY    . COLONOSCOPY WITH PROPOFOL N/A 02/23/2015   Procedure: COLONOSCOPY WITH PROPOFOL;  Surgeon: Ladene Artist, MD;  Location: [facility] ENDOSCOPY;  Service: Endoscopy;  Laterality: N/A;  . ESOPHAGOGASTRODUODENOSCOPY (EGD) WITH PROPOFOL N/A 02/23/2015   Procedure: ESOPHAGOGASTRODUODENOSCOPY (EGD) WITH PROPOFOL;  Surgeon: Ladene Artist, MD;  Location: [facility] ENDOSCOPY;  Service: Endoscopy;  Laterality: N/A;  . NO PAST SURGERIES      OB History    No data available       Home Medications    Prior to Admission medications   Medication Sig Start Date End Date Taking? Authorizing Provider  acetaminophen (TYLENOL) 325 MG tablet Take 2 tablets (650 mg total) by mouth every 6 (six) hours as needed for mild pain or headache (or Fever >/= 101). 11/23/14  Yes Christina P Rama, MD  albuterol (PROAIR HFA) 108 (90 BASE) MCG/ACT inhaler Inhale 1-2 puffs into the lungs every 6 (six) hours as needed for wheezing or shortness of breath. Patient taking differently: Inhale 1-2 puffs into the lungs every 6 (six) hours as needed for wheezing or shortness of breath. Patient take once a day instead of three times a day. 11/25/13  Yes [name], MD  allopurinol (ZYLOPRIM)  100 MG tablet TAKE 1 TABLET BY MOUTH EVERY DAY FOR GOUT Patient taking differently: Take one tablet by mouth every evening. 02/17/16  Yes [name], MD  aspirin 81 MG EC tablet Take 81 mg by mouth daily.     Yes Historical Provider, MD  cyanocobalamin 2000 MCG tablet Take 1 tablet (2,000 mcg total) by mouth daily. Patient taking differently: Take 2,000 mcg by mouth every evening.  02/24/16  Yes [name], MD  Dietary Management Product (VASCULERA)  TABS Take 1 tablet by mouth daily. 02/23/16  Yes [name], MD  Fluticasone Furoate-Vilanterol (BREO ELLIPTA) 100-25 MCG/INH AEPB Inhale 1 puff into the lungs daily. 01/26/15  Yes [name], MD  furosemide (LASIX) 40 MG tablet TAKE 1 TABLET BY MOUTH TWICE DAILY 01/18/16  Yes [name], MD  losartan (COZAAR) 100 MG tablet TAKE 1 TABLET BY MOUTH EVERY DAY. 04/03/15  Yes [name], MD  metoprolol (LOPRESSOR) 50 MG tablet TAKE 1 TABLET BY MOUTH TWICE DAILY 01/19/16  Yes [name], MD  nystatin (MYCOSTATIN) powder Use as directed twice per day as needed Patient taking differently: Apply 1 Bottle topically 2 (two) times daily as needed (skin irritation.). Use as directed twice per day as needed 12/07/14  Yes Biagio Borg, MD  triamcinolone cream (KENALOG) 0.5 % Apply 1 application topically 3 (three) times daily. 02/25/16  Yes [name], MD  Vitamin D, Ergocalciferol, (DRISDOL) 50000 UNITS CAPS capsule TAKE ONE CAPSULE BY MOUTH ONCE WEEKLY 07/18/15  Yes [name], MD  albuterol (PROVENTIL) (2.5 MG/3ML) 0.083% nebulizer solution Take 3 mLs (2.5 mg total) by nebulization every 6 (six) hours as needed for wheezing or shortness of breath. Patient taking differently: Take 2.5 mg by nebulization every 6 (six) hours as needed for wheezing or shortness of breath. Patient only takes once a day instead of three times a day 01/02/15   [name], MD  ferrous sulfate 325 (65 FE) MG tablet Take 1 tablet (325 mg total) by mouth 2 (two) times daily with a meal. Patient not taking: Reported on 03/19/2016 02/24/16   [name], MD  ketoconazole (NIZORAL) 2 % cream Apply 1 application topically 2 (two) times daily. Patient not taking: Reported on 03/19/2016 04/22/14   [name], MD  omeprazole (PRILOSEC) 40 MG capsule Take 1 tablet 30 min before breakfast. Patient not taking: Reported on 03/19/2016 02/16/15   Willia Craze, NP    Family History Family History  Problem Relation Age of  Onset  . Stroke Mother   . Diabetes Mother   . Kidney cancer Mother   . Stomach cancer Mother   . Heart disease Mother   . Stroke Father   . Heart disease Brother   . Heart disease Brother   . Crohn's disease Brother   . Heart disease Sister     CATH,STENT  . Clotting disorder Sister   . Cervical cancer Sister   . Lung cancer Sister   . Heart attack Sister   . Diabetes Sister   . Colon cancer Neg Hx     Social History Social History  Substance Use Topics  . Smoking status: Former Smoker    Quit date: 06/01/1996  . Smokeless tobacco: Never Used     Comment: Quit 1998  . Alcohol use No     Allergies   Celebrex [celecoxib]; Enalapril; Lipitor [atorvastatin]; Amlodipine besylate; Amoxicillin; Codeine sulfate; and Hydrocodone-acetaminophen   Review of  Systems Review of Systems  All other systems reviewed and are negative.    Physical Exam Updated Vital Signs BP (!) 150/52   Pulse 71   Temp 98.6 F (37 C) (Oral)   Resp 19   Ht 5' (1.524 m)   Wt 236 lb (107 kg)   SpO2 97%   BMI 46.09 kg/m   Physical Exam  General: Well-developed, well-nourished female in no acute distress; appearance consistent with age of record HENT: normocephalic; atraumatic Eyes: pupils equal, round and reactive to light; extraocular muscles intact Neck: supple Heart: regular rate and rhythm Lungs: clear to auscultation bilaterally Abdomen: soft; obese; nontender; tender umbilical hernia; right lower quadrant tenderness; bowel sounds present Rectal: Normal sphincter tone; stool heme positive Extremities: No deformity; full range of motion; edema of lower legs with chronic appearing skin changes Neurologic: Awake, alert and oriented; motor function intact in all extremities and symmetric; no facial droop Skin: Warm and dry Psychiatric: Normal mood and affect   Nursing notes and vitals signs, including pulse oximetry, reviewed.  Summary of this visit's results, reviewed by  myself:  Labs:  Results for orders placed or performed during the hospital encounter of 03/19/16 (from the past 24 hour(s))  Comprehensive metabolic panel     Status: Abnormal   Collection Time: 03/19/16 11:42 AM  Result Value Ref Range   Sodium 136 135 - 145 mmol/L   Potassium 4.0 3.5 - 5.1 mmol/L   Chloride 101 101 - 111 mmol/L   CO2 28 22 - 32 mmol/L   Glucose, Bld 133 (H) 65 - 99 mg/dL   BUN 31 (H) 6 - 20 mg/dL   Creatinine, Ser 1.39 (H) 0.44 - 1.00 mg/dL   Calcium 9.0 8.9 - 10.3 mg/dL   Total Protein 7.3 6.5 - 8.1 g/dL   Albumin 3.4 (L) 3.5 - 5.0 g/dL   AST 15 15 - 41 U/L   ALT 11 (L) 14 - 54 U/L   Alkaline Phosphatase 74 38 - 126 U/L   Total Bilirubin 0.2 (L) 0.3 - 1.2 mg/dL   GFR calc non Af Amer 36 (L) >60 mL/min   GFR calc Af Amer 42 (L) >60 mL/min   Anion gap 7 5 - 15  CBC     Status: Abnormal   Collection Time: 03/19/16 11:42 AM  Result Value Ref Range   WBC 9.7 4.0 - 10.5 K/uL   RBC 3.35 (L) 3.87 - 5.11 MIL/uL   Hemoglobin 7.8 (L) 12.0 - 15.0 g/dL   HCT 25.5 (L) 36.0 - 46.0 %   MCV 76.1 (L) 78.0 - 100.0 fL   MCH 23.3 (L) 26.0 - 34.0 pg   MCHC 30.6 30.0 - 36.0 g/dL   RDW 17.0 (H) 11.5 - 15.5 %   Platelets 257 150 - 400 K/uL  Type and screen [place]     Status: None   Collection Time: 03/19/16 11:42 AM  Result Value Ref Range   ABO/RH(D) O POS    Antibody Screen NEG    Sample Expiration 03/22/2016   ABO/Rh     Status: None   Collection Time: 03/19/16 11:42 AM  Result Value Ref Range   ABO/RH(D) O POS   POC occult blood, ED     Status: Abnormal   Collection Time: 03/19/16  3:38 PM  Result Value Ref Range   Fecal Occult Bld POSITIVE (A) NEGATIVE    Imaging Studies: Ct Abdomen Pelvis W Contrast  Result Date: 03/19/2016 CLINICAL DATA:  Lower abdominal pain, history of kidney stones and colitis EXAM: CT ABDOMEN AND PELVIS WITH CONTRAST TECHNIQUE: Multidetector CT imaging of the abdomen and pelvis was performed using the standard  protocol following bolus administration of intravenous contrast. CONTRAST:  64m ISOVUE-300 IOPAMIDOL (ISOVUE-300) INJECTION 61% COMPARISON:  None. FINDINGS: Lower chest: Lung bases are notable for a right posterior Bochdalek's hernia. Hepatobiliary: Liver is within normal limits. Gallbladder is unremarkable. No intrahepatic or extrahepatic ductal dilatation. Pancreas: Within normal limits. Spleen: Within normal limits. Adrenals/Urinary Tract: Adrenal glands are within normal limits. 15 mm cyst with layering hemorrhage posteriorly along the posterior left upper kidney (series 5/ image 7). Additional simple bilateral renal cysts measuring up to 2.4 cm along the anterior right lower kidney (series 2/ image 37). No hydronephrosis. Bladder is within normal limits. Stomach/Bowel: Stomach is within normal limits. No evidence of bowel obstruction. Normal appendix (series 2/image 48). Moderate periumbilical hernia containing fat, a nondilated loop of transverse colon (series 2/ image 53), and the distal appendix (series 2/image 52). Sigmoid diverticulosis, without evidence of diverticulitis. No colonic wall thickening or inflammatory changes. Vascular/Lymphatic: Atherosclerotic calcifications of the abdominal aorta and branch vessels. No evidence of abdominal aortic aneurysm. No suspicious abdominopelvic lymphadenopathy. Reproductive: Uterus is within normal limits. Bilateral ovaries are unremarkable. Other: No abdominopelvic ascites. Musculoskeletal: Degenerative changes of the visualized thoracolumbar spine. Mild superior endplate Schmorl's node deformities with loss of height involving the mid vertebral bodies at L3 and L4 (sagittal image 124). IMPRESSION: No evidence of bowel obstruction.  Normal appendix. Sigmoid diverticulosis, without associated inflammatory changes. Moderate periumbilical hernia containing fat, nondilated transverse colon, and the distal appendix. Additional ancillary findings as above.  Electronically Signed   By: SJulian HyM.D.   On: 03/19/2016 18:32    Procedures Procedures (including critical care time)   Final Clinical Impressions(s) / ED Diagnoses   Final diagnoses:  Lower GI bleed      JShanon Rosser MD 03/19/16 1857

## 2016-03-19 NOTE — H&P (Signed)
[name] KAJ:[id number] DOB: 11/27/1940 DOA: 03/19/2016     PCP: [name], MD   Outpatient Specialists:  Justin Mend  Patient coming from:    home Lives  With family    Chief Complaint: blood In stool  HPI: [name] is a 75 y.o. female with medical history significant of hx of  segmental colitis 2009, HTN, CK D, COPD on home 02., OSA, morbid obesity, MGUS, diastolic heart failure, diet controlled DM    Presented with red stool which started today after midnight she had had 5 soft stools since [place] stools were red but had some black and and again no vomiting associated that prior to this patient ate Chia seeds and she is concerned that that has caused bleeding. Associated with abdominal discomfort no fever no chills no chest pain or shortness of breath no lightheadedness no syncope or shin up having a total of 6 stools none since arrival to emergency department,  abdomnal discomfort mainly in the right lower quadrant which is moderate not severe. During that patient endorses chronic lower extremity edema which is unchanged, Patient has chronic small amount of blood in sttol but tonight is was very significant.      Regarding pertinent Chronic problems: Patient has known diastolic heart failure with preserved EF. Echogram April 2016, she has known history of ulcerative colitis followed by Dr. Sharlett Iles for a history of adenomatous colon polyps (2009)  Dr. Fuller Plan performed last colonoscopy/Endoscopy  in July 2016\  colonoscopy : 1. Four angiodysplastic lesions at the cecum 2. Sessile polyp in the ascending colon; polypectomy performed with a cold snare 3. Mild diverticulosis in the sigmoid colon 4. Grade l internal hemorrhoids  Endoscopy: 1. 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 Small hiatal hernia Prior colonoscopies have shown minimally active chronic colitis Patient was tried on Mesalamine but couldn't tolerate it secondary to worsening  diarrhea.   has had intermittent rectal bleeding for years,  Hgb has remained around baseline at 8.5  IN ER:  Temp (24hrs),  Max:98.6 F (37 C)  98.6 RR 20 HR 58 SBP 138/43 Hem occult positive Na 136 K 4.0 BUN 31 Cr 1.39 Alb 3.4  WBC Hg 7.8  PLT 257  CT: NO SBO, Sigmoid diverticulosis without associated inflammatory changes  Following Medications were ordered in ER: Medications  0.9 %  sodium chloride infusion (not administered)  famotidine (PEPCID) IVPB 20 mg premix (not administered)  diatrizoate meglumine-sodium (GASTROGRAFIN) 66-10 % solution 15 mL (15 mLs Oral Given 03/19/16 1505)  iopamidol (ISOVUE-300) 61 % injection 80 mL (80 mLs Intravenous Contrast Given 03/19/16 1802)    ER provider discussed case with: Dr. Fuller Plan who will see in AM  Hospitalist was called for admission for blood in stool  Review of Systems:    Pertinent positives include: blood in stool, nausea,  dyspnea on exertion chronic, Bilateral lower extremity swelling chronic Constitutional:  No weight loss, night sweats, Fevers, chills, fatigue, weight loss  HEENT:  No headaches, Difficulty swallowing,Tooth/dental problems,Sore throat,  No sneezing, itching, ear ache, nasal congestion, post nasal drip,  Cardio-vascular:  No chest pain, Orthopnea, PND, anasarca, dizziness, palpitations.no  GI:  No heartburn, indigestion, abdominal pain, vomiting, diarrhea, change in bowel habits, loss of appetite, melena, blood in stool, hematemesis Resp:  no shortness of breath at rest. No  No excess mucus, no productive cough, No non-productive cough, No coughing up of blood.No change in color of mucus.No wheezing. Skin:  no rash  or lesions. No jaundice GU:  no dysuria, change in color of urine, no urgency or frequency. No straining to urinate.  No flank pain.  Musculoskeletal:  No joint pain or no joint swelling. No decreased range of motion. No back pain.  Psych:  No change in mood or affect. No depression or  anxiety. No memory loss.  Neuro: no localizing neurological complaints, no tingling, no weakness, no double vision, no gait abnormality, no slurred speech, no confusion  As per HPI otherwise 10 point review of systems negative.   Past Medical History: Past Medical History:  Diagnosis Date  . CHF (congestive heart failure) ([place]) 08/31/03  . Colonic polyp 02/16/2008   Tubular adenoma  . COPD (chronic obstructive pulmonary disease) ([place]) 01/27/98  . GERD (gastroesophageal reflux disease) 07/30/92  . Gout   . HTN (hypertension) 07/30/1988  . Hyperlipidemia 11/27/96  . Kidney stone 1960, 1972, 1991  . Renal insufficiency   . Ulcerative colitis    Past Surgical History:  Procedure Laterality Date  . BRONCHOSCOPY    . COLONOSCOPY WITH PROPOFOL N/A 02/23/2015   Procedure: COLONOSCOPY WITH PROPOFOL;  Surgeon: Ladene Artist, MD;  Location: [facility];  Service: Endoscopy;  Laterality: N/A;  . ESOPHAGOGASTRODUODENOSCOPY (EGD) WITH PROPOFOL N/A 02/23/2015   Procedure: ESOPHAGOGASTRODUODENOSCOPY (EGD) WITH PROPOFOL;  Surgeon: Ladene Artist, MD;  Location: [facility];  Service: Endoscopy;  Laterality: N/A;  . NO PAST SURGERIES       Social History:  Ambulatory  Kasandra Knudsen     reports that she quit smoking about 19 years ago. She has never used smokeless tobacco. She reports that she does not drink alcohol or use drugs.  Allergies:   Allergies  Allergen Reactions  . Celebrex [Celecoxib]     edema  . Enalapril Cough  . Lipitor [Atorvastatin]     Muscle aches  . Amlodipine Besylate     REACTION: edema  . Amoxicillin     REACTION: Diarrhea  . Codeine Sulfate     REACTION: Nausea  . Hydrocodone-Acetaminophen     REACTION: Nausea     Family History:   Family History  Problem Relation Age of Onset  . Stroke Mother   . Diabetes Mother   . Kidney cancer Mother   . Stomach cancer Mother   . Heart disease Mother   . Stroke Father   . Heart disease Brother   . Heart disease Brother     . Crohn's disease Brother   . Heart disease Sister     CATH,STENT  . Clotting disorder Sister   . Cervical cancer Sister   . Lung cancer Sister   . Heart attack Sister   . Diabetes Sister   . Colon cancer Neg Hx     Medications: Prior to Admission medications   Medication Sig Start Date End Date Taking? Authorizing Provider  acetaminophen (TYLENOL) 325 MG tablet Take 2 tablets (650 mg total) by mouth every 6 (six) hours as needed for mild pain or headache (or Fever >/= 101). 11/23/14  Yes Christina P Rama, MD  albuterol (PROAIR HFA) 108 (90 BASE) MCG/ACT inhaler Inhale 1-2 puffs into the lungs every 6 (six) hours as needed for wheezing or shortness of breath. Patient taking differently: Inhale 1-2 puffs into the lungs every 6 (six) hours as needed for wheezing or shortness of breath. Patient take once a day instead of three times a day. 11/25/13  Yes [name], MD  allopurinol (ZYLOPRIM) 100 MG tablet TAKE 1  TABLET BY MOUTH EVERY DAY FOR GOUT Patient taking differently: Take one tablet by mouth every evening. 02/17/16  Yes [name], MD  aspirin 81 MG EC tablet Take 81 mg by mouth daily.     Yes Historical Provider, MD  cyanocobalamin 2000 MCG tablet Take 1 tablet (2,000 mcg total) by mouth daily. Patient taking differently: Take 2,000 mcg by mouth every evening.  02/24/16  Yes [name], MD  Dietary Management Product (VASCULERA) TABS Take 1 tablet by mouth daily. 02/23/16  Yes [name], MD  Fluticasone Furoate-Vilanterol (BREO ELLIPTA) 100-25 MCG/INH AEPB Inhale 1 puff into the lungs daily. 01/26/15  Yes [name], MD  furosemide (LASIX) 40 MG tablet TAKE 1 TABLET BY MOUTH TWICE DAILY 01/18/16  Yes [name], MD  losartan (COZAAR) 100 MG tablet TAKE 1 TABLET BY MOUTH EVERY DAY. 04/03/15  Yes [name], MD  metoprolol (LOPRESSOR) 50 MG tablet TAKE 1 TABLET BY MOUTH TWICE DAILY 01/19/16  Yes [name], MD  nystatin (MYCOSTATIN) powder Use as directed twice  per day as needed Patient taking differently: Apply 1 Bottle topically 2 (two) times daily as needed (skin irritation.). Use as directed twice per day as needed 12/07/14  Yes Biagio Borg, MD  triamcinolone cream (KENALOG) 0.5 % Apply 1 application topically 3 (three) times daily. 02/25/16  Yes [name], MD  Vitamin D, Ergocalciferol, (DRISDOL) 50000 UNITS CAPS capsule TAKE ONE CAPSULE BY MOUTH ONCE WEEKLY 07/18/15  Yes [name], MD  albuterol (PROVENTIL) (2.5 MG/3ML) 0.083% nebulizer solution Take 3 mLs (2.5 mg total) by nebulization every 6 (six) hours as needed for wheezing or shortness of breath. Patient taking differently: Take 2.5 mg by nebulization every 6 (six) hours as needed for wheezing or shortness of breath. Patient only takes once a day instead of three times a day 01/02/15   [name], MD  ferrous sulfate 325 (65 FE) MG tablet Take 1 tablet (325 mg total) by mouth 2 (two) times daily with a meal. Patient not taking: Reported on 03/19/2016 02/24/16   [name], MD  ketoconazole (NIZORAL) 2 % cream Apply 1 application topically 2 (two) times daily. Patient not taking: Reported on 03/19/2016 04/22/14   [name], MD  omeprazole (PRILOSEC) 40 MG capsule Take 1 tablet 30 min before breakfast. Patient not taking: Reported on 03/19/2016 02/16/15   Willia Craze, NP    Physical Exam: Patient Vitals for the past 24 hrs:  BP Temp Temp src Pulse Resp SpO2 Height Weight  03/19/16 1836 - 98.6 F (37 C) Oral - - - - -  03/19/16 1835 (!) 177/43 - - 80 21 95 % - -  03/19/16 1700 (!) 150/52 - - 71 19 97 % - -  03/19/16 1630 (!) 149/40 - - 66 20 94 % - -  03/19/16 1600 (!) 154/51 - - 69 22 95 % - -  03/19/16 1500 (!) 156/50 - - 65 17 100 % - -  03/19/16 1430 (!) 160/40 - - 65 22 97 % - -  03/19/16 1406 (!) 148/48 - - 70 20 100 % 5' (1.524 m) 107 kg (236 lb)  03/19/16 1135 (!) 138/42 98.5 F (36.9 C) Oral (!) 58 20 100 % - -    1. General:  in No Acute distress 2.  Psychological: Alert and   Oriented 3. Head/ENT:     Dry Mucous Membranes  Head Non traumatic, neck supple                          Normal  Dentition 4. SKIN:   decreased Skin turgor,  Skin clean Dry and intact no rash 5. Heart: Regular rate and rhythm no  Murmur, Rub or gallop 6. Lungs:  no wheezes mild crackles   7. Abdomen: Soft, mild right lower quadrant tenderness, Non distended 8. Lower extremities: no clubbing, cyanosis, or edema 9. Neurologically Grossly intact, moving all 4 extremities equally   10. MSK: Normal range of motion   body mass index is 46.09 kg/m.  Labs on Admission:   Labs on Admission: I have personally reviewed following labs and imaging studies  CBC:  Recent Labs Lab 03/19/16 1142  WBC 9.7  HGB 7.8*  HCT 25.5*  MCV 76.1*  PLT 952   Basic Metabolic Panel:  Recent Labs Lab 03/19/16 1142  NA 136  K 4.0  CL 101  CO2 28  GLUCOSE 133*  BUN 31*  CREATININE 1.39*  CALCIUM 9.0   GFR: Estimated Creatinine Clearance: 38.7 mL/min (by C-G formula based on SCr of 1.39 mg/dL). Liver Function Tests:  Recent Labs Lab 03/19/16 1142  AST 15  ALT 11*  ALKPHOS 74  BILITOT 0.2*  PROT 7.3  ALBUMIN 3.4*   No results for input(s): LIPASE, AMYLASE in the last 168 hours. No results for input(s): AMMONIA in the last 168 hours. Coagulation Profile: No results for input(s): INR, PROTIME in the last 168 hours. Cardiac Enzymes: No results for input(s): CKTOTAL, CKMB, CKMBINDEX, TROPONINI in the last 168 hours. BNP (last 3 results) No results for input(s): PROBNP in the last 8760 hours. HbA1C: No results for input(s): HGBA1C in the last 72 hours. CBG: No results for input(s): GLUCAP in the last 168 hours. Lipid Profile: No results for input(s): CHOL, HDL, LDLCALC, TRIG, CHOLHDL, LDLDIRECT in the last 72 hours. Thyroid Function Tests: No results for input(s): TSH, T4TOTAL, FREET4, T3FREE, THYROIDAB in the last 72 hours. Anemia  Panel: No results for input(s): VITAMINB12, FOLATE, FERRITIN, TIBC, IRON, RETICCTPCT in the last 72 hours. Urine analysis: none Sepsis Labs: @LABRCNTIP (procalcitonin:4,lacticidven:4) )No results found for this or any previous visit (from the past 240 hour(s)).    UA not ordered  Lab Results  Component Value Date   HGBA1C 6.1 02/23/2016    Estimated Creatinine Clearance: 38.7 mL/min (by C-G formula based on SCr of 1.39 mg/dL).  BNP (last 3 results) No results for input(s): PROBNP in the last 8760 hours.   ECG REPORT Not ordered  Filed Weights   03/19/16 1406  Weight: 107 kg (236 lb)     Cultures:    Component Value Date/Time   SDES LEG 06/01/2013 1805   SDES LEG 06/01/2013 1805   SPECREQUEST Normal 06/01/2013 1805   SPECREQUEST NONE 06/01/2013 1805   CULT  06/01/2013 1805    MULTIPLE ORGANISMS PRESENT, NONE PREDOMINANT Note: NO STAPHYLOCOCCUS AUREUS ISOLATED NO GROUP A STREP (S.PYOGENES) ISOLATED Performed at [place]  06/01/2013 1805    MULTIPLE ORGANISMS PRESENT, NONE PREDOMINANT Note: NO STAPHYLOCOCCUS AUREUS ISOLATED NO GROUP A STREP (S.PYOGENES) ISOLATED Performed at [place] 06/04/2013 FINAL 06/01/2013 1805   REPTSTATUS 06/04/2013 FINAL 06/01/2013 1805     Radiological Exams on Admission: Ct Abdomen Pelvis W Contrast  Result Date: 03/19/2016 CLINICAL DATA:  Lower abdominal pain, history of kidney stones and colitis EXAM: CT  ABDOMEN AND PELVIS WITH CONTRAST TECHNIQUE: Multidetector CT imaging of the abdomen and pelvis was performed using the standard protocol following bolus administration of intravenous contrast. CONTRAST:  61m ISOVUE-300 IOPAMIDOL (ISOVUE-300) INJECTION 61% COMPARISON:  None. FINDINGS: Lower chest: Lung bases are notable for a right posterior Bochdalek's hernia. Hepatobiliary: Liver is within normal limits. Gallbladder is unremarkable. No intrahepatic or extrahepatic ductal dilatation. Pancreas:  Within normal limits. Spleen: Within normal limits. Adrenals/Urinary Tract: Adrenal glands are within normal limits. 15 mm cyst with layering hemorrhage posteriorly along the posterior left upper kidney (series 5/ image 7). Additional simple bilateral renal cysts measuring up to 2.4 cm along the anterior right lower kidney (series 2/ image 37). No hydronephrosis. Bladder is within normal limits. Stomach/Bowel: Stomach is within normal limits. No evidence of bowel obstruction. Normal appendix (series 2/image 48). Moderate periumbilical hernia containing fat, a nondilated loop of transverse colon (series 2/ image 53), and the distal appendix (series 2/image 52). Sigmoid diverticulosis, without evidence of diverticulitis. No colonic wall thickening or inflammatory changes. Vascular/Lymphatic: Atherosclerotic calcifications of the abdominal aorta and branch vessels. No evidence of abdominal aortic aneurysm. No suspicious abdominopelvic lymphadenopathy. Reproductive: Uterus is within normal limits. Bilateral ovaries are unremarkable. Other: No abdominopelvic ascites. Musculoskeletal: Degenerative changes of the visualized thoracolumbar spine. Mild superior endplate Schmorl's node deformities with loss of height involving the mid vertebral bodies at L3 and L4 (sagittal image 124). IMPRESSION: No evidence of bowel obstruction.  Normal appendix. Sigmoid diverticulosis, without associated inflammatory changes. Moderate periumbilical hernia containing fat, nondilated transverse colon, and the distal appendix. Additional ancillary findings as above. Electronically Signed   By: SJulian HyM.D.   On: 03/19/2016 18:32    Chart has been reviewed    Assessment/Plan  75y.o. female with medical history significant of hx of  segmental colitis 2009, HTN, CK D, COPD on home 02., OSA, morbid obesity, MGUS, diastolic heart failure, diet controlled DM admitted for lower GI bleed GI is aware was seen in the  morning  Present on Admission: . Lower GI bleed admit to tele, serial CBC, [facility] consult, most likely lower GI bleed. Hold aspirin, No hx of PUD. NPO post MN, monitor for repeat bleeding if evidence of severe active bleeding may benefit from bleeding scan.   type and screen, transfuse if active bleeding or as indicated . Anemia, iron deficiency likely secondary to chronic blood loss will obtain anemia panel to evaluate for iron levels . [place] continue home medications . HTN (hypertension) continue metoprolol to avoid rebound tachycardia but hold other home medications given acute GI blood loss monitor  . Morbid obesity (HCC) - chronic, BMI 46 . OSA (obstructive sleep apnea) per patient she is not using C Pap . ULCERATIVE COLITIS-LEFT SIDE appreciated GI consult patient states she hasn't been able to take her medications secondary to cost . (Resolved) Venous stasis dermatitis chronic currently appears to be his out evidence of ulceration . CKD (chronic kidney disease), stage III chronic creatinine currently at baseline. . Monoclonal paraproteinemia chronic continue to monitor . Chronic diastolic CHF (congestive heart failure), NYHA class 2 (HCC) stable hold Lasix avoid fluid overload currently appears to be euvolemic Diet controlled diabetes will order sliding scale check hemoglobin A1c  Other plan as per orders.  DVT prophylaxis:  SCD     Code Status:  Partial code no BiPAP no intubaton no CPR of to do shock and medications.  as per patient  Family Communication:   Family  at  Bedside  plan of care was discussed with   Husband  [facility] [place] [phone] home  [phone] cell , daughter Neomia Dear [phone]    Disposition Plan:      To home once workup is complete and patient is stable                                         Consults called: [facility]  Admission status:   inpatient       Level of care     tele            I  have spent a total of 56 min on this admission    Saxton Chain 03/19/2016, 8:32 PM    [facility]  Pager [phone]   after 2 AM please page floor coverage PA If 7AM-7PM, please contact the day team taking care of the patient  [email or web address]  Password [email or web address]

## 2016-03-19 NOTE — Telephone Encounter (Signed)
atient Name: [name]  DOB: 1941-01-25    Initial Comment Caller states ate chia seeds over the weekend, looks like a lot of blood in stool   Nurse Assessment  Nurse: Raphael Gibney, RN, [facility] Date/Time (Eastern Time): 03/19/2016 9:16:14 AM  Confirm and document reason for call. If symptomatic, describe symptoms. You must click the next button to save text entered. ---Caller states she ate a lot of chia seeds over the weekend. Stool looked red after midnight. Has had 5 diarrhea stools since midnight. Stools look red and have some black in it. No vomiting.  Has the patient traveled out of the country within the last 30 days? ---Not Applicable  Does the patient have any new or worsening symptoms? ---Yes  Will a triage be completed? ---Yes  Related visit to physician within the last 2 weeks? ---No  Does the PT have any chronic conditions? (i.e. diabetes, asthma, etc.) ---Yes  List chronic conditions. ---stage 3 kidney disease; COPD, CHF  Is this a behavioral health or substance abuse call? ---No     Guidelines    Guideline Title Affirmed Question Affirmed Notes  Rectal Bleeding [1] MODERATE rectal bleeding (small blood clots, passing blood without stool, or toilet water turns red) AND [2] more than once a day    Final Disposition User   Go to ED Now Raphael Gibney, RN, [place] - ED   Disagree/Comply: Comply

## 2016-03-20 DIAGNOSIS — K5150 Left sided colitis without complications: Secondary | ICD-10-CM

## 2016-03-20 DIAGNOSIS — D509 Iron deficiency anemia, unspecified: Secondary | ICD-10-CM

## 2016-03-20 DIAGNOSIS — J449 Chronic obstructive pulmonary disease, unspecified: Secondary | ICD-10-CM

## 2016-03-20 DIAGNOSIS — K922 Gastrointestinal hemorrhage, unspecified: Secondary | ICD-10-CM

## 2016-03-20 DIAGNOSIS — N183 Chronic kidney disease, stage 3 (moderate): Secondary | ICD-10-CM

## 2016-03-20 LAB — COMPREHENSIVE METABOLIC PANEL
ALT: 10 U/L — AB (ref 14–54)
ANION GAP: 5 (ref 5–15)
AST: 14 U/L — ABNORMAL LOW (ref 15–41)
Albumin: 3 g/dL — ABNORMAL LOW (ref 3.5–5.0)
Alkaline Phosphatase: 65 U/L (ref 38–126)
BUN: 23 mg/dL — ABNORMAL HIGH (ref 6–20)
CALCIUM: 8.9 mg/dL (ref 8.9–10.3)
CHLORIDE: 106 mmol/L (ref 101–111)
CO2: 27 mmol/L (ref 22–32)
CREATININE: 1.31 mg/dL — AB (ref 0.44–1.00)
GFR, EST AFRICAN AMERICAN: 45 mL/min — AB (ref >60)
GFR, EST NON AFRICAN AMERICAN: 39 mL/min — AB (ref >60)
Glucose, Bld: 109 mg/dL — ABNORMAL HIGH (ref 65–99)
Potassium: 4.1 mmol/L (ref 3.5–5.1)
SODIUM: 138 mmol/L (ref 135–145)
Total Bilirubin: 0.3 mg/dL (ref 0.3–1.2)
Total Protein: 6.8 g/dL (ref 6.5–8.1)

## 2016-03-20 LAB — GLUCOSE, CAPILLARY
GLUCOSE-CAPILLARY: 116 mg/dL — AB (ref 65–99)
GLUCOSE-CAPILLARY: 106 mg/dL — AB (ref 65–99)
GLUCOSE-CAPILLARY: 100 mg/dL — AB (ref 65–99)
GLUCOSE-CAPILLARY: 114 mg/dL — AB (ref 65–99)
Glucose-Capillary: 113 mg/dL — ABNORMAL HIGH (ref 65–99)
Glucose-Capillary: 164 mg/dL — ABNORMAL HIGH (ref 65–99)

## 2016-03-20 LAB — CBC
HCT: 23.8 % — ABNORMAL LOW (ref 36.0–46.0)
HEMATOCRIT: 23.8 % — AB (ref 36.0–46.0)
HEMOGLOBIN: 7.4 g/dL — AB (ref 12.0–15.0)
HEMOGLOBIN: 7.3 g/dL — AB (ref 12.0–15.0)
MCH: 23.7 pg — ABNORMAL LOW (ref 26.0–34.0)
MCH: 23.5 pg — ABNORMAL LOW (ref 26.0–34.0)
MCHC: 31.1 g/dL (ref 30.0–36.0)
MCHC: 30.7 g/dL (ref 30.0–36.0)
MCV: 76.3 fL — AB (ref 78.0–100.0)
MCV: 76.5 fL — AB (ref 78.0–100.0)
PLATELETS: 217 10*3/uL (ref 150–400)
Platelets: 222 10*3/uL (ref 150–400)
RBC: 3.12 MIL/uL — AB (ref 3.87–5.11)
RBC: 3.11 MIL/uL — ABNORMAL LOW (ref 3.87–5.11)
RDW: 17.3 % — ABNORMAL HIGH (ref 11.5–15.5)
RDW: 17.2 % — ABNORMAL HIGH (ref 11.5–15.5)
WBC: 7.7 10*3/uL (ref 4.0–10.5)
WBC: 6.8 10*3/uL (ref 4.0–10.5)

## 2016-03-20 LAB — TSH: TSH: 2.466 u[IU]/mL (ref 0.350–4.500)

## 2016-03-20 LAB — HEMOGLOBIN AND HEMATOCRIT, BLOOD
HEMATOCRIT: 22.6 % — AB (ref 36.0–46.0)
Hemoglobin: 6.9 g/dL — CL (ref 12.0–15.0)

## 2016-03-20 LAB — PHOSPHORUS: Phosphorus: 3.4 mg/dL (ref 2.5–4.6)

## 2016-03-20 LAB — PREPARE RBC (CROSSMATCH)

## 2016-03-20 LAB — MAGNESIUM: MAGNESIUM: 2.4 mg/dL (ref 1.7–2.4)

## 2016-03-20 MED ORDER — FUROSEMIDE 10 MG/ML IJ SOLN
20.0000 mg | Freq: Once | INTRAMUSCULAR | Status: AC
  Administered 2016-03-21: 20 mg via INTRAVENOUS
  Filled 2016-03-20: qty 2

## 2016-03-20 MED ORDER — INSULIN ASPART 100 UNIT/ML ~~LOC~~ SOLN
0.0000 [IU] | Freq: Three times a day (TID) | SUBCUTANEOUS | Status: DC
  Administered 2016-03-21 – 2016-03-22 (×3): 2 [IU] via SUBCUTANEOUS

## 2016-03-20 MED ORDER — INSULIN ASPART 100 UNIT/ML ~~LOC~~ SOLN: 0.0000 [IU] | Freq: Every day | SUBCUTANEOUS | Status: DC

## 2016-03-20 MED ORDER — LOSARTAN POTASSIUM 50 MG PO TABS
100.0000 mg | ORAL_TABLET | Freq: Every day | ORAL | Status: DC
Start: 2016-03-20 — End: 2016-03-22
  Administered 2016-03-20 – 2016-03-22 (×3): 100 mg via ORAL
  Filled 2016-03-20 (×3): qty 2

## 2016-03-20 MED ORDER — SODIUM CHLORIDE 0.9 % IV SOLN
510.0000 mg | Freq: Once | INTRAVENOUS | Status: AC
  Administered 2016-03-20: 510 mg via INTRAVENOUS
  Filled 2016-03-20: qty 17

## 2016-03-20 MED ORDER — BUDESONIDE 3 MG PO CPEP
9.0000 mg | ORAL_CAPSULE | Freq: Every day | ORAL | Status: DC
  Administered 2016-03-20 – 2016-03-22 (×3): 9 mg via ORAL
  Filled 2016-03-20 (×3): qty 3

## 2016-03-20 MED ORDER — SODIUM CHLORIDE 0.9 % IV SOLN
Freq: Once | INTRAVENOUS | Status: AC
Start: 2016-03-20 — End: 2016-03-20
  Administered 2016-03-20: 21:00:00 via INTRAVENOUS

## 2016-03-20 NOTE — Progress Notes (Signed)
CRITICAL VALUE ALERT  Critical value received: HGB 6.9  Date of notification:  03/20/17   Time of notification:  1292  Critical value read back:  Nurse who received alert:  Jodell Cipro  MD notified (1st page):  [facility]  Time of first page:  1745  MD notified (2nd page):  Time of second page:  Responding MD:    Time MD responded:

## 2016-03-20 NOTE — Progress Notes (Signed)
PROGRESS NOTE    JOSSALYN FORGIONE  LKT:[id number] DOB: 1940/10/23 DOA: 03/19/2016 PCP: [name], MD   Outpatient Specialists:     Brief Narrative:  75 y.o. female with medical history significant of hx of  segmental colitis 2009, HTN, CK D, COPD on home 02., OSA, morbid obesity, MGUS, diastolic heart failure, diet controlled DM admitted for lower GI bleed. Seen by GI with recommendations of IV Fe replacement and monitoring  Assessment & Plan:   Active Problems:   Morbid obesity ([place])   HTN (hypertension)   ULCERATIVE COLITIS-LEFT SIDE   CKD (chronic kidney disease), stage III   Monoclonal paraproteinemia   OSA (obstructive sleep apnea)   Anemia, iron deficiency   COLD (chronic obstructive lung disease) (HCC)   Lower GI bleed   Chronic diastolic CHF (congestive heart failure), NYHA class 2 (HCC)   Diabetes mellitus (HCC)   Lower GI bleed   -clears -recent evaluation for IDA with a colonoscopy and EGD on 02/23/15 with known AVMs  Anemia, iron deficiency likely secondary to chronic blood loss -IV fe and then PO replacement  COLD (chronic obstructive lung disease) ([place]) on home oxygen continue home medications  HTN (hypertension) continue metoprolol to avoid rebound tachycardia but hold other home medications given acute GI blood loss monitor   Morbid obesity (HCC) - chronic, BMI 46  OSA (obstructive sleep apnea) per patient she is not using C Pap  ULCERATIVE COLITIS-LEFT SIDE appreciated GI consult patient states she hasn't been able to take her medications secondary to cost  CKD (chronic kidney disease), stage III chronic creatinine currently at baseline.  Chronic diastolic CHF (congestive heart failure), NYHA class 2 (HCC) stable hold Lasix avoid fluid overload currently appears to be euvolemic  Diet controlled diabetes will order sliding scale check hemoglobin A1c   DVT prophylaxis:  SCD's  Code Status: Partial Code  Family Communication: Multiple at  bedside  Disposition Plan:     Consultants:   GI  Procedures:         Subjective: No complaints, no further bleeding  Objective: Vitals:   03/19/16 2113 03/20/16 0456 03/20/16 0814 03/20/16 1000  BP: (!) 171/40 (!) 160/51  (!) 200/50  Pulse: 81 68  80  Resp: 18 18    Temp: 98.4 F (36.9 C) 98.6 F (37 C)    TempSrc: Oral Oral    SpO2: 95% 95% 91%   Weight: 107.8 kg (237 lb 10.5 oz)     Height: 5' (1.524 m)       Intake/Output Summary (Last 24 hours) at 03/20/16 1414 Last data filed at 03/20/16 0700  Gross per 24 hour  Intake           484.17 ml  Output                0 ml  Net           484.17 ml   Filed Weights   03/19/16 1406 03/19/16 2113  Weight: 107 kg (236 lb) 107.8 kg (237 lb 10.5 oz)    Examination:  General exam: Appears calm and comfortable  Respiratory system: Clear to auscultation. Respiratory effort normal. Cardiovascular system: S1 & S2 heard, RRR. No JVD, murmurs, rubs, gallops or clicks. No pedal edema. Gastrointestinal system: Abdomen is nondistended, soft and nontender. No organomegaly or masses felt. Normal bowel sounds heard. Central nervous system: Alert and oriented. No focal neurological deficits. Psychiatry: Judgement and insight appear normal. Mood & affect appropriate.  Data Reviewed: I have personally reviewed following labs and imaging studies  CBC:  Recent Labs Lab 03/19/16 1142 03/19/16 2131 03/20/16 0513 03/20/16 1139  WBC 9.7 9.1 7.7 6.8  HGB 7.8* 7.6* 7.4* 7.3*  HCT 25.5* 24.5* 23.8* 23.8*  MCV 76.1* 75.2* 76.3* 76.5*  PLT 257 225 217 315   Basic Metabolic Panel:  Recent Labs Lab 03/19/16 1142 03/20/16 0513  NA 136 138  K 4.0 4.1  CL 101 106  CO2 28 27  GLUCOSE 133* 109*  BUN 31* 23*  CREATININE 1.39* 1.31*  CALCIUM 9.0 8.9  MG  --  2.4  PHOS  --  3.4   GFR: Estimated Creatinine Clearance: 41.2 mL/min (by C-G formula based on SCr of 1.31 mg/dL). Liver Function Tests:  Recent Labs Lab  03/19/16 1142 03/20/16 0513  AST 15 14*  ALT 11* 10*  ALKPHOS 74 65  BILITOT 0.2* 0.3  PROT 7.3 6.8  ALBUMIN 3.4* 3.0*   No results for input(s): LIPASE, AMYLASE in the last 168 hours. No results for input(s): AMMONIA in the last 168 hours. Coagulation Profile: No results for input(s): INR, PROTIME in the last 168 hours. Cardiac Enzymes: No results for input(s): CKTOTAL, CKMB, CKMBINDEX, TROPONINI in the last 168 hours. BNP (last 3 results) No results for input(s): PROBNP in the last 8760 hours. HbA1C: No results for input(s): HGBA1C in the last 72 hours. CBG:  Recent Labs Lab 03/19/16 2122 03/20/16 0020 03/20/16 0443 03/20/16 0734 03/20/16 1142  GLUCAP 106* 113* 116* 106* 100*   Lipid Profile: No results for input(s): CHOL, HDL, LDLCALC, TRIG, CHOLHDL, LDLDIRECT in the last 72 hours. Thyroid Function Tests:  Recent Labs  03/20/16 0513  TSH 2.466   Anemia Panel: No results for input(s): VITAMINB12, FOLATE, FERRITIN, TIBC, IRON, RETICCTPCT in the last 72 hours. Urine analysis:    Component Value Date/Time   COLORURINE YELLOW 04/22/2014 1420   APPEARANCEUR CLEAR 04/22/2014 1420   LABSPEC <=1.005 (A) 04/22/2014 1420   PHURINE 6.0 04/22/2014 1420   GLUCOSEU NEGATIVE 04/22/2014 1420   HGBUR NEGATIVE 04/22/2014 1420   HGBUR moderate 02/02/2008 1439   BILIRUBINUR NEGATIVE 04/22/2014 1420   KETONESUR NEGATIVE 04/22/2014 1420   PROTEINUR 100 (A) 06/01/2013 1722   UROBILINOGEN 0.2 04/22/2014 1420   NITRITE NEGATIVE 04/22/2014 1420   LEUKOCYTESUR NEGATIVE 04/22/2014 1420   Sepsis Labs: @LABRCNTIP (procalcitonin:4,lacticidven:4)  )No results found for this or any previous visit (from the past 240 hour(s)).    Anti-infectives    None       Radiology Studies: Ct Abdomen Pelvis W Contrast  Result Date: 03/19/2016 CLINICAL DATA:  Lower abdominal pain, history of kidney stones and colitis EXAM: CT ABDOMEN AND PELVIS WITH CONTRAST TECHNIQUE: Multidetector CT  imaging of the abdomen and pelvis was performed using the standard protocol following bolus administration of intravenous contrast. CONTRAST:  56m ISOVUE-300 IOPAMIDOL (ISOVUE-300) INJECTION 61% COMPARISON:  None. FINDINGS: Lower chest: Lung bases are notable for a right posterior Bochdalek's hernia. Hepatobiliary: Liver is within normal limits. Gallbladder is unremarkable. No intrahepatic or extrahepatic ductal dilatation. Pancreas: Within normal limits. Spleen: Within normal limits. Adrenals/Urinary Tract: Adrenal glands are within normal limits. 15 mm cyst with layering hemorrhage posteriorly along the posterior left upper kidney (series 5/ image 7). Additional simple bilateral renal cysts measuring up to 2.4 cm along the anterior right lower kidney (series 2/ image 37). No hydronephrosis. Bladder is within normal limits. Stomach/Bowel: Stomach is within normal limits. No evidence of bowel obstruction. Normal appendix (series  2/image 48). Moderate periumbilical hernia containing fat, a nondilated loop of transverse colon (series 2/ image 53), and the distal appendix (series 2/image 52). Sigmoid diverticulosis, without evidence of diverticulitis. No colonic wall thickening or inflammatory changes. Vascular/Lymphatic: Atherosclerotic calcifications of the abdominal aorta and branch vessels. No evidence of abdominal aortic aneurysm. No suspicious abdominopelvic lymphadenopathy. Reproductive: Uterus is within normal limits. Bilateral ovaries are unremarkable. Other: No abdominopelvic ascites. Musculoskeletal: Degenerative changes of the visualized thoracolumbar spine. Mild superior endplate Schmorl's node deformities with loss of height involving the mid vertebral bodies at L3 and L4 (sagittal image 124). IMPRESSION: No evidence of bowel obstruction.  Normal appendix. Sigmoid diverticulosis, without associated inflammatory changes. Moderate periumbilical hernia containing fat, nondilated transverse colon, and the  distal appendix. Additional ancillary findings as above. Electronically Signed   By: Julian Hy M.D.   On: 03/19/2016 18:32        Scheduled Meds: . allopurinol  100 mg Oral QPM  . budesonide  9 mg Oral Daily  . fluticasone furoate-vilanterol  1 puff Inhalation Daily  . insulin aspart  0-9 Units Subcutaneous Q4H  . metoprolol  50 mg Oral BID  . pantoprazole  40 mg Oral BID  . sodium chloride flush  3 mL Intravenous Q12H   Continuous Infusions:    LOS: 1 day    Time spent: 35 min    [place], DO [facility] Pager [phone]  If 7PM-7AM, please contact night-coverage [email or web address] Password [facility] 03/20/2016, 2:14 PM

## 2016-03-20 NOTE — Plan of Care (Signed)
Problem: Safety: Goal: Ability to remain free from injury will improve Outcome: Completed/Met Date Met: 03/20/16 Gait is unsteady intermittently. Pt was encouraged to continue to call for assist when out of bed. Pt is agreeable to plan of care

## 2016-03-20 NOTE — Consult Note (Addendum)
Consultation  Referring Provider:  Dr. Eliseo Squires    Primary Care Physician:  [name], MD Primary Gastroenterologist: Dr. Fuller Plan        Reason for Consultation: Abdominal pain, Hematochezia             HPI:   [name] is a 75 y.o. female with a past medical history significant for segmental colitis discovered in 2009, hypertension, CAD, COPD on home oxygen, OSA, morbid obesity, diastolic heart failure and diet controlled diabetes, who presented to the ER on 03/19/16 with a complaint of abdominal pain and hematochezia.  Today, the patient describes that on Saturday and Sunday morning she had Chia seeds in her cereal which was "abnormal for her", and she believes this brought on her current episode of abdominal pain and bleeding. She started with abdominal pain and an episode of hematochezia on "Sunday night after midnight", when she had a bowel movement she wiped and side a lot of bright red blood and there was even more "goblets of red blood" in the toilet mixed with stool. This occurred again and was more like "bloody water with diarrhea", she also tells me he had some "black in it", she had multiple episodes, she tells me 6 on Monday morning with the last around 10 AM which "did not have as much blood and actually had some "balls of stool", she then felt very weak and proceeded to the ER.  Since time of admission patient denies further bowel movements that she has continued to see some "red when I wipe". Patient describes that initially she started with a right lower quadrant pain which then transferred to her left side and is now "all over". She does describe a chronic anemia for which she has been told to take iron supplements but has not been able to afford the once prescribed by her doctor. She also describes a distant diagnosis of ulcerative colitis and being given "samples of medication" to try from Dr. Sharlett Iles in the past. She tells me that she was unable to tolerate these and has  not been on any medicine in the past few years.  Patient does describe chronic bleeding which she tells me is from "capillaries in my colon", but the amount of blood she saw yesterday was "weigh more" and was accompanied by a lot of abdominal pain which is abnormal for her.  Previous GI history: 02/23/15-colonoscopy, Dr. Fuller Plan: Impression: 4 angiodysplastic lesions at the cecum, sessile polyp in the ascending colon, mild diverticulosis in the sigmoid colon, grade 1 internal hemorrhoids; pathology: Tubular adenoma, no dysplasia 02/23/15-EGD, Dr. Fuller Plan: Impression: AV malformations in the second part of the duodenum, small hiatal hernia\ 03/19/2008-office visit, Dr. Sharlett Iles: It was noted that the patient could not tolerate Lialda and was placed on nasal call twice a day and had worsening of diarrhea, abdominal cramps and bloating as well as 1 episode of nausea and vomiting. It was discussed that Dr. Sharlett Iles do not believe her symptoms were severe enough to warrant corticosteroid therapy or 6-MP therapy. She was placed on a high-fiber diet and encouraged to take daily Benefiber. 02/16/2008-colonoscopy, Dr. Sharlett Iles: Impression: Diverticulosis, rule out left-sided colitis, recurrent colon polyps; pathology: Tubular adenoma with no dysplasia at the hepatic flexure, minimally active chronic colitis in the sigmoid colon, it was thought that the histologic features support a diagnosis of IBD, likely ulcerative colitis  Past Medical History:  Diagnosis Date  . CHF (congestive heart failure) ([place]) 08/31/03  . Colonic polyp 02/16/2008  Tubular adenoma  . COPD (chronic obstructive pulmonary disease) ([place]) 01/27/98  . GERD (gastroesophageal reflux disease) 07/30/92  . Gout   . HTN (hypertension) 07/30/1988  . Hyperlipidemia 11/27/96  . Kidney stone 1960, 1972, 1991  . Renal insufficiency   . Ulcerative colitis     Past Surgical History:  Procedure Laterality Date  . BRONCHOSCOPY    . COLONOSCOPY WITH PROPOFOL  N/A 02/23/2015   Procedure: COLONOSCOPY WITH PROPOFOL;  Surgeon: Ladene Artist, MD;  Location: [facility];  Service: Endoscopy;  Laterality: N/A;  . ESOPHAGOGASTRODUODENOSCOPY (EGD) WITH PROPOFOL N/A 02/23/2015   Procedure: ESOPHAGOGASTRODUODENOSCOPY (EGD) WITH PROPOFOL;  Surgeon: Ladene Artist, MD;  Location: [facility];  Service: Endoscopy;  Laterality: N/A;  . NO PAST SURGERIES      Family History  Problem Relation Age of Onset  . Stroke Mother   . Diabetes Mother   . Kidney cancer Mother   . Stomach cancer Mother   . Heart disease Mother   . Stroke Father   . Heart disease Brother   . Heart disease Brother   . Crohn's disease Brother   . Heart disease Sister     CATH,STENT  . Clotting disorder Sister   . Cervical cancer Sister   . Lung cancer Sister   . Heart attack Sister   . Diabetes Sister   . Colon cancer Neg Hx     Social History  Substance Use Topics  . Smoking status: Former Smoker    Quit date: 06/01/1996  . Smokeless tobacco: Never Used     Comment: Quit 1998  . Alcohol use No    Prior to Admission medications   Medication Sig Start Date End Date Taking? Authorizing Provider  acetaminophen (TYLENOL) 325 MG tablet Take 2 tablets (650 mg total) by mouth every 6 (six) hours as needed for mild pain or headache (or Fever >/= 101). 11/23/14  Yes Christina P Rama, MD  albuterol (PROAIR HFA) 108 (90 BASE) MCG/ACT inhaler Inhale 1-2 puffs into the lungs every 6 (six) hours as needed for wheezing or shortness of breath. Patient taking differently: Inhale 1-2 puffs into the lungs every 6 (six) hours as needed for wheezing or shortness of breath. Patient take once a day instead of three times a day. 11/25/13  Yes [name], MD  allopurinol (ZYLOPRIM) 100 MG tablet TAKE 1 TABLET BY MOUTH EVERY DAY FOR GOUT Patient taking differently: Take one tablet by mouth every evening. 02/17/16  Yes [name], MD  aspirin 81 MG EC tablet Take 81 mg by mouth daily.     Yes  Historical Provider, MD  cyanocobalamin 2000 MCG tablet Take 1 tablet (2,000 mcg total) by mouth daily. Patient taking differently: Take 2,000 mcg by mouth every evening.  02/24/16  Yes [name], MD  Dietary Management Product (VASCULERA) TABS Take 1 tablet by mouth daily. 02/23/16  Yes [name], MD  Fluticasone Furoate-Vilanterol (BREO ELLIPTA) 100-25 MCG/INH AEPB Inhale 1 puff into the lungs daily. 01/26/15  Yes [name], MD  furosemide (LASIX) 40 MG tablet TAKE 1 TABLET BY MOUTH TWICE DAILY 01/18/16  Yes [name], MD  losartan (COZAAR) 100 MG tablet TAKE 1 TABLET BY MOUTH EVERY DAY. 04/03/15  Yes [name], MD  metoprolol (LOPRESSOR) 50 MG tablet TAKE 1 TABLET BY MOUTH TWICE DAILY 01/19/16  Yes [name], MD  nystatin (MYCOSTATIN) powder Use as directed twice per day as needed Patient taking differently: Apply  1 Bottle topically 2 (two) times daily as needed (skin irritation.). Use as directed twice per day as needed 12/07/14  Yes Biagio Borg, MD  triamcinolone cream (KENALOG) 0.5 % Apply 1 application topically 3 (three) times daily. 02/25/16  Yes [name], MD  Vitamin D, Ergocalciferol, (DRISDOL) 50000 UNITS CAPS capsule TAKE ONE CAPSULE BY MOUTH ONCE WEEKLY 07/18/15  Yes [name], MD  albuterol (PROVENTIL) (2.5 MG/3ML) 0.083% nebulizer solution Take 3 mLs (2.5 mg total) by nebulization every 6 (six) hours as needed for wheezing or shortness of breath. Patient taking differently: Take 2.5 mg by nebulization every 6 (six) hours as needed for wheezing or shortness of breath. Patient only takes once a day instead of three times a day 01/02/15   [name], MD  ferrous sulfate 325 (65 FE) MG tablet Take 1 tablet (325 mg total) by mouth 2 (two) times daily with a meal. Patient not taking: Reported on 03/19/2016 02/24/16   [name], MD  ketoconazole (NIZORAL) 2 % cream Apply 1 application topically 2 (two) times daily. Patient not taking: Reported on  03/19/2016 04/22/14   [name], MD  omeprazole (PRILOSEC) 40 MG capsule Take 1 tablet 30 min before breakfast. Patient not taking: Reported on 03/19/2016 02/16/15   Willia Craze, NP    Current Facility-Administered Medications  Medication Dose Route Frequency Provider Last Rate Last Dose  . acetaminophen (TYLENOL) tablet 650 mg  650 mg Oral Q6H PRN Toy Baker, MD       Or  . acetaminophen (TYLENOL) suppository 650 mg  650 mg Rectal Q6H PRN Toy Baker, MD      . albuterol (PROVENTIL) (2.5 MG/3ML) 0.083% nebulizer solution 2.5 mg  2.5 mg Nebulization Q6H PRN Toy Baker, MD      . allopurinol (ZYLOPRIM) tablet 100 mg  100 mg Oral QPM Toy Baker, MD   100 mg at 03/19/16 2221  . fluticasone furoate-vilanterol (BREO ELLIPTA) 100-25 MCG/INH 1 puff  1 puff Inhalation Daily Jeryl Columbia, NP   1 puff at 03/20/16 [phone]  . insulin aspart (novoLOG) injection 0-9 Units  0-9 Units Subcutaneous Q4H Toy Baker, MD      . metoprolol (LOPRESSOR) tablet 50 mg  50 mg Oral BID Toy Baker, MD   50 mg at 03/19/16 2221  . ondansetron (ZOFRAN) tablet 4 mg  4 mg Oral Q6H PRN Toy Baker, MD       Or  . ondansetron (ZOFRAN) injection 4 mg  4 mg Intravenous Q6H PRN Toy Baker, MD      . pantoprazole (PROTONIX) EC tablet 40 mg  40 mg Oral BID Toy Baker, MD   40 mg at 03/19/16 2221  . sodium chloride flush (NS) 0.9 % injection 3 mL  3 mL Intravenous Q12H Toy Baker, MD        Allergies as of 03/19/2016 - Review Complete 03/19/2016  Allergen Reaction Noted  . Celebrex [celecoxib]  10/10/2011  . Enalapril Cough 02/19/2011  . Lipitor [atorvastatin]  04/14/2014  . Amlodipine besylate  05/25/2010  . Amoxicillin    . Codeine sulfate    . Hydrocodone-acetaminophen       Review of Systems:    Constitutional: Positive for weakness No weight loss, fever or chills HEENT: Eyes: No Change in vision               Ears, Nose, Throat:   No change in hearing or congestion Skin: No rash or itching Cardiovascular:  Positive for chronic bilateral lower extremity edema No chest pain, chest pressure or palpitations   Respiratory: Positive for dyspnea on exertion which is chronic No SOB or cough Gastrointestinal: See HPI and otherwise negative Genitourinary: No dysuria or change in urinary frequency Neurological: No headache, dizziness or syncope Musculoskeletal: No new muscle or back pain Hematologic: Positive for chronic blood loss anemia thought related to AVMs Psychiatric: No history of depression or anxiety   Physical Exam:  Vital signs in last 24 hours: Temp:  [98.4 F (36.9 C)-98.6 F (37 C)] 98.6 F (37 C) (08/22 0456) Pulse Rate:  [58-81] 68 (08/22 0456) Resp:  [17-22] 18 (08/22 0456) BP: (138-177)/(40-52) 160/51 (08/22 0456) SpO2:  [91 %-100 %] 91 % (08/22 0814) Weight:  [236 lb (107 kg)-237 lb 10.5 oz (107.8 kg)] 237 lb 10.5 oz (107.8 kg) (08/21 2113) Last BM Date: 03/19/16 General:   Pleasant Obese Caucasian female appears to be in NAD, Well developed, Well nourished, alert and cooperative Head:  Normocephalic and atraumatic. Eyes:   PEERL, EOMI. No icterus. Conjunctiva pink. Ears:  Normal auditory acuity. Neck:  Supple Throat: Oral cavity and pharynx without inflammation, swelling or lesion. Lungs: Respirations even and unlabored. Lungs clear to auscultation bilaterally.   No wheezes, crackles, or rhonchi.  Heart: Normal S1, S2. No MRG. Regular rate and rhythm. Bilateral lower extremity edema up to the level of admission Abdomen:  Soft, nondistended, generalized mild TTP. No rebound or guarding. Normal bowel sounds. No appreciable masses or hepatomegaly. Rectal:  Not performed.  Msk:  Symmetrical without gross deformities. Peripheral pulses intact.  Extremities:  Without edema, no deformity or joint abnormality.  Neurologic:  Alert and  oriented x4;  grossly normal neurologically.  Skin:   Dry and intact  without significant lesions or rashes. Psychiatric: Oriented to person, place and time. Demonstrates good judgement and reason without abnormal affect or behaviors.   LAB RESULTS:  Recent Labs  03/19/16 1142 03/19/16 2131 03/20/16 0513  WBC 9.7 9.1 7.7  HGB 7.8* 7.6* 7.4*  HCT 25.5* 24.5* 23.8*  PLT 257 225 217   BMET  Recent Labs  03/19/16 1142 03/20/16 0513  NA 136 138  K 4.0 4.1  CL 101 106  CO2 28 27  GLUCOSE 133* 109*  BUN 31* 23*  CREATININE 1.39* 1.31*  CALCIUM 9.0 8.9   LFT  Recent Labs  03/20/16 0513  PROT 6.8  ALBUMIN 3.0*  AST 14*  ALT 10*  ALKPHOS 65  BILITOT 0.3   PT/INR No results for input(s): LABPROT, INR in the last 72 hours.  STUDIES: Ct Abdomen Pelvis W Contrast  Result Date: 03/19/2016 CLINICAL DATA:  Lower abdominal pain, history of kidney stones and colitis EXAM: CT ABDOMEN AND PELVIS WITH CONTRAST TECHNIQUE: Multidetector CT imaging of the abdomen and pelvis was performed using the standard protocol following bolus administration of intravenous contrast. CONTRAST:  36m ISOVUE-300 IOPAMIDOL (ISOVUE-300) INJECTION 61% COMPARISON:  None. FINDINGS: Lower chest: Lung bases are notable for a right posterior Bochdalek's hernia. Hepatobiliary: Liver is within normal limits. Gallbladder is unremarkable. No intrahepatic or extrahepatic ductal dilatation. Pancreas: Within normal limits. Spleen: Within normal limits. Adrenals/Urinary Tract: Adrenal glands are within normal limits. 15 mm cyst with layering hemorrhage posteriorly along the posterior left upper kidney (series 5/ image 7). Additional simple bilateral renal cysts measuring up to 2.4 cm along the anterior right lower kidney (series 2/ image 37). No hydronephrosis. Bladder is within normal limits. Stomach/Bowel: Stomach is within normal limits. No evidence  of bowel obstruction. Normal appendix (series 2/image 48). Moderate periumbilical hernia containing fat, a nondilated loop of transverse colon  (series 2/ image 53), and the distal appendix (series 2/image 52). Sigmoid diverticulosis, without evidence of diverticulitis. No colonic wall thickening or inflammatory changes. Vascular/Lymphatic: Atherosclerotic calcifications of the abdominal aorta and branch vessels. No evidence of abdominal aortic aneurysm. No suspicious abdominopelvic lymphadenopathy. Reproductive: Uterus is within normal limits. Bilateral ovaries are unremarkable. Other: No abdominopelvic ascites. Musculoskeletal: Degenerative changes of the visualized thoracolumbar spine. Mild superior endplate Schmorl's node deformities with loss of height involving the mid vertebral bodies at L3 and L4 (sagittal image 124). IMPRESSION: No evidence of bowel obstruction.  Normal appendix. Sigmoid diverticulosis, without associated inflammatory changes. Moderate periumbilical hernia containing fat, nondilated transverse colon, and the distal appendix. Additional ancillary findings as above. Electronically Signed   By: Julian Hy M.D.   On: 03/19/2016 18:32     PREVIOUS ENDOSCOPIES:            See history of present illness, last in 2016   Impression / Plan:   Impression: 1. Hematochezia: Patient with history of chronic active colitis in the sigmoid colon on colonoscopy in 2009, no further signs of this in 2016 at time of last colonoscopy, current episode was associated with a lot of abdominal cramping and continued pain, after review of previous colon inflammation seen in area of multiple diverticula-inflammation likely represented SCAD (segmental colitis associated with diverticulosis) also known history of AVMs and anemia; consider most likley SCAD vs ischemic colitis versus UC versus AVM 2. Abdominal pain: Initially in the right lower quadrant, now generalized, cramping episodes, see above 3. Iron deficiency anemia: Evaluated with EGD and colonoscopy last year, multiple findings of AVMs which were thought to be the cause, patient tells  me she cannot use iron supplementation prescribed by her doctor is too expensive, currently hemoglobin has dropped to 7.4 from 8.3 in January of this year, patient seems to have stayed around 8.5-9 over the past year, there was an acute drop from 7.8-7.4 over the last 12 hours, likely related to hematochezia 4. COLD (chronic obstructive lung disease): Patient on home oxygen 5. Chronic diastolic CHF 6. CK D, stage III 7. Morbid obesity 8. Abdominal hernia: Chronic for the patient, continue to observe, this is been discussed with the patient in the past  Plan: 1. Patient has had recent evaluation for IDA with a colonoscopy and EGD on 02/23/15 with known AVMs and does have multiple comorbidities and high risk of anesthesia/procedures. After discussion with the patient, we will not proceed with colonoscopy. Instead recommend supportive measures and observation for any further signs of acute bleeding. 2. Continue to observe hemoglobin with transfusion as needed 3. Likely patient will need to speak with someone regarding assistance for medications in the future as it seems she cannot afford even her iron supplementation as prescribed by her PCP 4. Patient will be placed on clear liquid diet today 5. Started Entocort today for possible/suspected SCAD or IBD. 6. Patient would likely benefit from IV iron replacement while her, appreciate hospitalist's recommendations regarding this 7. Discussed above with Dr. Fuller Plan, please await any further recommendations  Thank you for your kind consultation, we will continue to follow.  Lavone Nian [employer]  03/20/2016, 9:49 AM Pager #: [phone]     Attending physician's note   I have taken a history, examined the patient and reviewed the chart. I agree with the Advanced Practitioner's note, impression and recommendations.  Hematochezia and worsending  iron deficiency anemia. Colonoscopy in 2009 showed a segmental chronic active colitis. Colonoscopy in 01/2015  performed for hematochezia and anemia showed 4 cecal AVMs, 1 small adenomatous polyp, sigmoid diverticulosis, internal hemorrhoids. EGD in 01/2015 showed a duodenal AVM. She has not been taking Fe supplements as prescribed-states they are too expensive. Chronic blood loss from known colonic and duodenal AVMs leading to bleeding and iron deficiency. In addition she could have a relapse of segmental colitis (which could be SCAD or IBD) or diverticular, AVM or hemorrhoids causing her acute hematochezia . Recommend IV Fe replacement in hospital and finding an oral iron that she can afford to take at home. Start Entocort for possible IBD or SCAD. No plans to repeat colonoscopy at this time given colonoscopy 1 year ago and multiple comorbidities.   Lucio Edward, MD [facility] [phone] Mon-Fri 8a-5p [phone] after 5p, weekends, holidays

## 2016-03-20 NOTE — Plan of Care (Signed)
Problem: Education: Goal: Knowledge of [facility] Education information/materials will improve Outcome: Completed/Met Date Met: 03/20/16 Plan of care reviewed with pt and family while at bedside per pt request. Questions concerns denied. Pt verbalized good understanding of reason for admission and treatment to assist with progression of care

## 2016-03-21 DIAGNOSIS — K921 Melena: Secondary | ICD-10-CM

## 2016-03-21 LAB — BASIC METABOLIC PANEL
ANION GAP: 4 — AB (ref 5–15)
BUN: 16 mg/dL (ref 6–20)
CHLORIDE: 108 mmol/L (ref 101–111)
CO2: 27 mmol/L (ref 22–32)
Calcium: 9.2 mg/dL (ref 8.9–10.3)
Creatinine, Ser: 1.34 mg/dL — ABNORMAL HIGH (ref 0.44–1.00)
GFR calc non Af Amer: 38 mL/min — ABNORMAL LOW (ref >60)
GFR, EST AFRICAN AMERICAN: 44 mL/min — AB (ref >60)
GLUCOSE: 111 mg/dL — AB (ref 65–99)
POTASSIUM: 4.4 mmol/L (ref 3.5–5.1)
Sodium: 139 mmol/L (ref 135–145)

## 2016-03-21 LAB — CBC
HEMATOCRIT: 30.6 % — AB (ref 36.0–46.0)
HEMOGLOBIN: 9.9 g/dL — AB (ref 12.0–15.0)
MCH: 24.9 pg — ABNORMAL LOW (ref 26.0–34.0)
MCHC: 32.4 g/dL (ref 30.0–36.0)
MCV: 77.1 fL — AB (ref 78.0–100.0)
Platelets: 210 10*3/uL (ref 150–400)
RBC: 3.97 MIL/uL (ref 3.87–5.11)
RDW: 16.3 % — ABNORMAL HIGH (ref 11.5–15.5)
WBC: 8.7 10*3/uL (ref 4.0–10.5)

## 2016-03-21 LAB — GLUCOSE, CAPILLARY
GLUCOSE-CAPILLARY: 97 mg/dL (ref 65–99)
GLUCOSE-CAPILLARY: 132 mg/dL — AB (ref 65–99)
Glucose-Capillary: 109 mg/dL — ABNORMAL HIGH (ref 65–99)
Glucose-Capillary: 126 mg/dL — ABNORMAL HIGH (ref 65–99)
Glucose-Capillary: 127 mg/dL — ABNORMAL HIGH (ref 65–99)

## 2016-03-21 LAB — RETICULOCYTES
RBC.: 3.75 MIL/uL — ABNORMAL LOW (ref 3.87–5.11)
RETIC CT PCT: 1.6 % (ref 0.4–3.1)
Retic Count, Absolute: 60 10*3/uL (ref 19.0–186.0)

## 2016-03-21 LAB — HEMOGLOBIN A1C
HEMOGLOBIN A1C: 5.9 % — AB (ref 4.8–5.6)
MEAN PLASMA GLUCOSE: 123 mg/dL

## 2016-03-21 MED ORDER — HYDRALAZINE HCL 20 MG/ML IJ SOLN
5.0000 mg | Freq: Once | INTRAMUSCULAR | Status: AC
  Administered 2016-03-21: 5 mg via INTRAVENOUS
  Filled 2016-03-21: qty 1

## 2016-03-21 MED ORDER — SODIUM CHLORIDE 0.9 % IV SOLN: 510.0000 mg | Freq: Once | INTRAVENOUS | Status: DC

## 2016-03-21 NOTE — Progress Notes (Signed)
[facility] Progress Note  Subjective:  Feels better than yesterday since getting transfused with 2 units PRBC's.  Still has diffuse abdominal pain.  No further rectal bleeding that she is aware of.  Objective:  Vital signs in last 24 hours: Temp:  [97.6 F (36.4 C)-98.8 F (37.1 C)] 98.1 F (36.7 C) (08/23 0418) Pulse Rate:  [52-83] 58 (08/23 0418) Resp:  [16-22] 20 (08/23 0418) BP: (160-200)/(50-66) 181/66 (08/23 0418) SpO2:  [90 %-100 %] 100 % (08/23 0418) Weight:  [238 lb 1.6 oz (108 kg)] 238 lb 1.6 oz (108 kg) (08/23 0600) Last BM Date: 03/19/16 General:  Alert, Well-developed, in NAD Heart:  Regular rate and rhythm; no murmurs Pulm:  Decreased BS B/L. Abdomen:  Soft, non-distended.  BS present.  Diffuse TTP. Extremities:  Without edema. Neurologic:  Alert and oriented x 4;  grossly normal neurologically. Psych:  Alert and cooperative. Normal mood and affect.  Intake/Output from previous day: 08/22 0701 - 08/23 0700 In: 772 [Blood:772] Out: 1100 [Urine:1100]  Lab Results:  Recent Labs  03/20/16 0513 03/20/16 1139 03/20/16 1720 03/21/16 0644  WBC 7.7 6.8  --  8.7  HGB 7.4* 7.3* 6.9* 9.9*  HCT 23.8* 23.8* 22.6* 30.6*  PLT 217 222  --  210   BMET  Recent Labs  03/19/16 1142 03/20/16 0513 03/21/16 0644  NA 136 138 139  K 4.0 4.1 4.4  CL 101 106 108  CO2 28 27 27   GLUCOSE 133* 109* 111*  BUN 31* 23* 16  CREATININE 1.39* 1.31* 1.34*  CALCIUM 9.0 8.9 9.2   LFT  Recent Labs  03/20/16 0513  PROT 6.8  ALBUMIN 3.0*  AST 14*  ALT 10*  ALKPHOS 65  BILITOT 0.3   Ct Abdomen Pelvis W Contrast  Result Date: 03/19/2016 CLINICAL DATA:  Lower abdominal pain, history of kidney stones and colitis EXAM: CT ABDOMEN AND PELVIS WITH CONTRAST TECHNIQUE: Multidetector CT imaging of the abdomen and pelvis was performed using the standard protocol following bolus administration of intravenous contrast. CONTRAST:  19m ISOVUE-300 IOPAMIDOL  (ISOVUE-300) INJECTION 61% COMPARISON:  None. FINDINGS: Lower chest: Lung bases are notable for a right posterior Bochdalek's hernia. Hepatobiliary: Liver is within normal limits. Gallbladder is unremarkable. No intrahepatic or extrahepatic ductal dilatation. Pancreas: Within normal limits. Spleen: Within normal limits. Adrenals/Urinary Tract: Adrenal glands are within normal limits. 15 mm cyst with layering hemorrhage posteriorly along the posterior left upper kidney (series 5/ image 7). Additional simple bilateral renal cysts measuring up to 2.4 cm along the anterior right lower kidney (series 2/ image 37). No hydronephrosis. Bladder is within normal limits. Stomach/Bowel: Stomach is within normal limits. No evidence of bowel obstruction. Normal appendix (series 2/image 48). Moderate periumbilical hernia containing fat, a nondilated loop of transverse colon (series 2/ image 53), and the distal appendix (series 2/image 52). Sigmoid diverticulosis, without evidence of diverticulitis. No colonic wall thickening or inflammatory changes. Vascular/Lymphatic: Atherosclerotic calcifications of the abdominal aorta and branch vessels. No evidence of abdominal aortic aneurysm. No suspicious abdominopelvic lymphadenopathy. Reproductive: Uterus is within normal limits. Bilateral ovaries are unremarkable. Other: No abdominopelvic ascites. Musculoskeletal: Degenerative changes of the visualized thoracolumbar spine. Mild superior endplate Schmorl's node deformities with loss of height involving the mid vertebral bodies at L3 and L4 (sagittal image 124). IMPRESSION: No evidence of bowel obstruction.  Normal appendix. Sigmoid diverticulosis, without associated inflammatory changes. Moderate periumbilical hernia containing fat, nondilated transverse colon, and the distal appendix. Additional ancillary findings as above. Electronically Signed  By: Julian Hy M.D.   On: 03/19/2016 18:32   Assessment / Plan: 1.  Hematochezia/abdominal pain: Patient with history of chronic active colitis in the sigmoid colon on colonoscopy in 2009, no further signs of this in 2016 at time of last colonoscopy.  Current episode was associated with a lot of abdominal cramping and continued pain.  After review of previous colon inflammation seen in area of multiple diverticula-inflammation likely represented SCAD (segmental colitis associated with diverticulosis) also known history of AVMs and anemia; consider most likley SCAD vs ischemic colitis versus UC.  Started on budesonide 9 mg daily 8/22.  CT scan this admission ok. 3. Iron deficiency anemia: Evaluated with EGD and colonoscopy last year, multiple AVMs which were thought to be the cause.  Patient says that she cannot use iron supplementation prescribed by her doctor is too expensive. Hgb down to 6.9 grams last evening for which she was transfuse with 2 units PRBC's. 4. COLD (chronic obstructive lung disease): Patient on home oxygen. 5. Chronic diastolic CHF 6. CKD, stage III 7. Morbid obesity 8. Abdominal hernia: Continue to observe, this is been discussed with the patient in the past.  *Continue to monitor Hgb and transfuse further PRN. *Ordered pharmacy consult for IV iron infusion as inpatient. *No plans for further endoscopic evaluation at this time due to previous evaluations and multiple co-morbidities. *Continue Entocort (budesonide 9 mg daily). *Continue clear liquids for today.   LOS: 2 days   ZEHR, JESSICA D.  03/21/2016, 8:47 AM  Pager number [phone]     Attending physician's note   I have taken an interval history, reviewed the chart and examined the patient. I agree with the Advanced Practitioner's note, impression and recommendations. Bleeding has subsided. Hb improved to 9.9 post transfusion. Observe for rebleeding on clear liquids today. Trend Hb. No plans for endoscopic evaluation at this time.    Lucio Edward, MD [facility] [phone] Mon-Fri  8a-5p [phone] after 5p, weekends, holidays

## 2016-03-21 NOTE — Evaluation (Signed)
Physical Therapy Evaluation Patient Details Name: [name] MRN: [id number] DOB: 04-07-1941 Today's Date: 03/21/2016   History of Present Illness  75 yo female admitted with LGIB. Hx of COPD-O2 dep, chronic LE swelling, HTN, morbid obesity, CKD.   Clinical Impression  On eval, pt required Min assist for mobility. She walked ~15'x 2 with a SPC. Unsteadiness noted even with use of cane. BP 184/62 after short walk. Pt c/o some lightheadedness. Short walk only today. Will check back another day to progress activity. Recommend HHPT follow up. Instructed pt to consider using RW for ambulation temporarily.     Follow Up Recommendations Home health PT;Supervision - Intermittent    Equipment Recommendations  None recommended by PT    Recommendations for Other Services       Precautions / Restrictions Precautions Precautions: Fall Restrictions Weight Bearing Restrictions: No      Mobility  Bed Mobility Overal bed mobility: Needs Assistance Bed Mobility: Supine to Sit     Supine to sit: Min guard     General bed mobility comments: close guard for safety  Transfers Overall transfer level: Needs assistance Equipment used: Straight cane Transfers: Sit to/from Stand Sit to Stand: Min assist         General transfer comment: assist to stabilize.   Ambulation/Gait Ambulation/Gait assistance: Min assist Ambulation Distance (Feet): 15 Feet (x2) Assistive device: Straight cane Gait Pattern/deviations: Step-through pattern;Decreased stride length     General Gait Details: Unsteady. Pt reaching out for objects to hold on to in addition to using cane. Fatigues fairly easily. Remained on [place] O2.   Stairs            Wheelchair Mobility    Modified Rankin (Stroke Patients Only)       Balance                                             Pertinent Vitals/Pain Pain Assessment: No/denies pain    Home Living Family/patient expects to be  discharged to:: Private residence Living Arrangements: Alone   Type of Home: House Home Access: Stairs to enter   [employer] of Steps: 1 Home Layout: One level Home Equipment: [place] - single point;Walker - 2 wheels;Bedside commode;Grab bars - tub/shower      Prior Function Level of Independence: Independent with assistive device(s)         Comments: uses cane for ambulation     Hand Dominance        Extremity/Trunk Assessment   Upper Extremity Assessment: Overall WFL for tasks assessed           Lower Extremity Assessment: Generalized weakness      Cervical / Trunk Assessment: Normal  Communication   Communication: No difficulties  Cognition Arousal/Alertness: Awake/alert Behavior During Therapy: WFL for tasks assessed/performed Overall Cognitive Status: Within Functional Limits for tasks assessed                      General Comments      Exercises        Assessment/Plan    PT Assessment Patient needs continued PT services  PT Diagnosis Difficulty walking;Generalized weakness   PT Problem List Decreased strength;Decreased mobility;Decreased activity tolerance;Decreased balance  PT Treatment Interventions DME instruction;Gait training;Functional mobility training;Balance training;Therapeutic exercise;Therapeutic activities;Patient/family education   PT Goals (Current goals can be found in the Care Plan  section) Acute Rehab PT Goals Patient Stated Goal: home soon PT Goal Formulation: With patient Time For Goal Achievement: 04/04/16 Potential to Achieve Goals: Good    Frequency Min 3X/week   Barriers to discharge        Co-evaluation               End of Session Equipment Utilized During Treatment: Oxygen Activity Tolerance: Patient tolerated treatment well Patient left: in chair;with call bell/phone within reach;with family/visitor present           Time: 1445-1519 PT Time Calculation (min) (ACUTE ONLY): 34  min   Charges:   PT Evaluation $PT Eval Low Complexity: 1 Procedure PT Treatments $Gait Training: 8-22 mins   PT G Codes:        Weston Anna, MPT Pager: [phone]

## 2016-03-21 NOTE — Progress Notes (Signed)
Pharmacy Note   Pharmacy is consulted to dose IV Iron in 75 yo patient with iron deficiency anemia.    Current Iron/anemia profile: Iron 16 ug/dL Saturation ratio: 4.1 % Ferritin 13.9 ng/ml Transferrin 276 mg/dl Folate 16 ng/ml   Pt received dose of Feraheme 510 mg IV x1 on 8/22 already.   Medication can be redosed 3 to 8 days later. Will re-evaluate on 8/25.  Royetta Asal, PharmD, BCPS Pager [phone] 03/21/2016 1:05 PM

## 2016-03-21 NOTE — Progress Notes (Signed)
PROGRESS NOTE    [name]  BWI:[id number] DOB: 05/26/1941 DOA: 03/19/2016 PCP: [name], MD   Outpatient Specialists:     Brief Narrative:  75 y.o. female with medical history significant of hx of  segmental colitis 2009, HTN, CK D, COPD on home 02., OSA, morbid obesity, MGUS, diastolic heart failure, diet controlled DM admitted for lower GI bleed. Seen by GI with recommendations of IV Fe replacement and monitoring  Assessment & Plan:   Active Problems:   Morbid obesity (HCC)   HTN (hypertension)   ULCERATIVE COLITIS-LEFT SIDE   CKD (chronic kidney disease), stage III   Monoclonal paraproteinemia   OSA (obstructive sleep apnea)   Anemia, iron deficiency   COLD (chronic obstructive lung disease) (HCC)   Lower GI bleed   Chronic diastolic CHF (congestive heart failure), NYHA class 2 (HCC)   Diabetes mellitus (HCC)   Lower GI bleed   -clears- watch for signs of re-bleeding -recent evaluation for IDA with a colonoscopy and EGD on 02/23/15 with known AVMs  Anemia, iron deficiency likely secondary to chronic blood loss -IV fe and then PO replacement at home  COLD (chronic obstructive lung disease) ([place]) on home oxygen continue home medications (2L O2 at home)  HTN (hypertension) continue metoprolol to avoid rebound tachycardia -resume all home meds  Morbid obesity (HCC) - chronic, BMI 46  OSA (obstructive sleep apnea) per patient she is not using C Pap  ULCERATIVE COLITIS-LEFT SIDE appreciated GI consult patient states she hasn't been able to take her medications secondary to cost  CKD (chronic kidney disease), stage III chronic creatinine currently at baseline.  Chronic diastolic CHF (congestive heart failure), NYHA class 2 ([place])  -resume lasix  Diet controlled diabetes will order sliding scale check hemoglobin A1c  PT Eval   DVT prophylaxis:  SCD's  Code Status: Partial Code  Family Communication: Multiple at bedside  Disposition Plan:      Consultants:   GI  Procedures:         Subjective: No complaints, no further bleeding  Objective: Vitals:   03/21/16 0418 03/21/16 0600 03/21/16 0933 03/21/16 0939  BP: (!) 181/66  (!) 183/55   Pulse: (!) 58  71   Resp: 20     Temp: 98.1 F (36.7 C)     TempSrc: Oral     SpO2: 100%   96%  Weight:  108 kg (238 lb 1.6 oz)    Height:        Intake/Output Summary (Last 24 hours) at 03/21/16 1327 Last data filed at 03/21/16 1303  Gross per 24 hour  Intake              775 ml  Output             1100 ml  Net             -325 ml   Filed Weights   03/19/16 1406 03/19/16 2113 03/21/16 0600  Weight: 107 kg (236 lb) 107.8 kg (237 lb 10.5 oz) 108 kg (238 lb 1.6 oz)    Examination:  General exam: Appears calm and comfortable  Respiratory system: Clear to auscultation. Respiratory effort normal. Cardiovascular system: S1 & S2 heard, RRR. No JVD, murmurs, rubs, gallops or clicks. No pedal edema. Gastrointestinal system: Abdomen is nondistended, soft and nontender. No organomegaly or masses felt. Normal bowel sounds heard. Central nervous system: Alert and oriented. No focal neurological deficits. Psychiatry: Judgement and insight appear normal. Mood & affect appropriate.  Data Reviewed: I have personally reviewed following labs and imaging studies  CBC:  Recent Labs Lab 03/19/16 1142 03/19/16 2131 03/20/16 0513 03/20/16 1139 03/20/16 1720 03/21/16 0644  WBC 9.7 9.1 7.7 6.8  --  8.7  HGB 7.8* 7.6* 7.4* 7.3* 6.9* 9.9*  HCT 25.5* 24.5* 23.8* 23.8* 22.6* 30.6*  MCV 76.1* 75.2* 76.3* 76.5*  --  77.1*  PLT 257 225 217 222  --  119   Basic Metabolic Panel:  Recent Labs Lab 03/19/16 1142 03/20/16 0513 03/21/16 0644  NA 136 138 139  K 4.0 4.1 4.4  CL 101 106 108  CO2 28 27 27   GLUCOSE 133* 109* 111*  BUN 31* 23* 16  CREATININE 1.39* 1.31* 1.34*  CALCIUM 9.0 8.9 9.2  MG  --  2.4  --   PHOS  --  3.4  --    GFR: Estimated Creatinine Clearance:  40.4 mL/min (by C-G formula based on SCr of 1.34 mg/dL). Liver Function Tests:  Recent Labs Lab 03/19/16 1142 03/20/16 0513  AST 15 14*  ALT 11* 10*  ALKPHOS 74 65  BILITOT 0.2* 0.3  PROT 7.3 6.8  ALBUMIN 3.4* 3.0*   No results for input(s): LIPASE, AMYLASE in the last 168 hours. No results for input(s): AMMONIA in the last 168 hours. Coagulation Profile: No results for input(s): INR, PROTIME in the last 168 hours. Cardiac Enzymes: No results for input(s): CKTOTAL, CKMB, CKMBINDEX, TROPONINI in the last 168 hours. BNP (last 3 results) No results for input(s): PROBNP in the last 8760 hours. HbA1C:  Recent Labs  03/20/16 0513  HGBA1C 5.9*   CBG:  Recent Labs Lab 03/20/16 1600 03/20/16 2100 03/21/16 0030 03/21/16 0745 03/21/16 1230  GLUCAP 114* 164* 127* 97 132*   Lipid Profile: No results for input(s): CHOL, HDL, LDLCALC, TRIG, CHOLHDL, LDLDIRECT in the last 72 hours. Thyroid Function Tests:  Recent Labs  03/20/16 0513  TSH 2.466   Anemia Panel: No results for input(s): VITAMINB12, FOLATE, FERRITIN, TIBC, IRON, RETICCTPCT in the last 72 hours. Urine analysis:    Component Value Date/Time   COLORURINE YELLOW 04/22/2014 1420   APPEARANCEUR CLEAR 04/22/2014 1420   LABSPEC <=1.005 (A) 04/22/2014 1420   PHURINE 6.0 04/22/2014 1420   GLUCOSEU NEGATIVE 04/22/2014 1420   HGBUR NEGATIVE 04/22/2014 1420   HGBUR moderate 02/02/2008 1439   BILIRUBINUR NEGATIVE 04/22/2014 1420   KETONESUR NEGATIVE 04/22/2014 1420   PROTEINUR 100 (A) 06/01/2013 1722   UROBILINOGEN 0.2 04/22/2014 1420   NITRITE NEGATIVE 04/22/2014 1420   LEUKOCYTESUR NEGATIVE 04/22/2014 1420     )No results found for this or any previous visit (from the past 240 hour(s)).    Anti-infectives    None       Radiology Studies: Ct Abdomen Pelvis W Contrast  Result Date: 03/19/2016 CLINICAL DATA:  Lower abdominal pain, history of kidney stones and colitis EXAM: CT ABDOMEN AND PELVIS WITH  CONTRAST TECHNIQUE: Multidetector CT imaging of the abdomen and pelvis was performed using the standard protocol following bolus administration of intravenous contrast. CONTRAST:  1m ISOVUE-300 IOPAMIDOL (ISOVUE-300) INJECTION 61% COMPARISON:  None. FINDINGS: Lower chest: Lung bases are notable for a right posterior Bochdalek's hernia. Hepatobiliary: Liver is within normal limits. Gallbladder is unremarkable. No intrahepatic or extrahepatic ductal dilatation. Pancreas: Within normal limits. Spleen: Within normal limits. Adrenals/Urinary Tract: Adrenal glands are within normal limits. 15 mm cyst with layering hemorrhage posteriorly along the posterior left upper kidney (series 5/ image 7). Additional simple bilateral renal cysts measuring  up to 2.4 cm along the anterior right lower kidney (series 2/ image 37). No hydronephrosis. Bladder is within normal limits. Stomach/Bowel: Stomach is within normal limits. No evidence of bowel obstruction. Normal appendix (series 2/image 48). Moderate periumbilical hernia containing fat, a nondilated loop of transverse colon (series 2/ image 53), and the distal appendix (series 2/image 52). Sigmoid diverticulosis, without evidence of diverticulitis. No colonic wall thickening or inflammatory changes. Vascular/Lymphatic: Atherosclerotic calcifications of the abdominal aorta and branch vessels. No evidence of abdominal aortic aneurysm. No suspicious abdominopelvic lymphadenopathy. Reproductive: Uterus is within normal limits. Bilateral ovaries are unremarkable. Other: No abdominopelvic ascites. Musculoskeletal: Degenerative changes of the visualized thoracolumbar spine. Mild superior endplate Schmorl's node deformities with loss of height involving the mid vertebral bodies at L3 and L4 (sagittal image 124). IMPRESSION: No evidence of bowel obstruction.  Normal appendix. Sigmoid diverticulosis, without associated inflammatory changes. Moderate periumbilical hernia containing fat,  nondilated transverse colon, and the distal appendix. Additional ancillary findings as above. Electronically Signed   By: Julian Hy M.D.   On: 03/19/2016 18:32        Scheduled Meds: . allopurinol  100 mg Oral QPM  . budesonide  9 mg Oral Daily  . fluticasone furoate-vilanterol  1 puff Inhalation Daily  . insulin aspart  0-15 Units Subcutaneous TID WC  . insulin aspart  0-5 Units Subcutaneous QHS  . losartan  100 mg Oral Daily  . metoprolol  50 mg Oral BID  . pantoprazole  40 mg Oral BID  . sodium chloride flush  3 mL Intravenous Q12H   Continuous Infusions:    LOS: 2 days    Time spent: 25 min    [place], DO [facility] Pager [phone]  If 7PM-7AM, please contact night-coverage [email or web address] Password [email or web address] 03/21/2016, 1:27 PM

## 2016-03-22 DIAGNOSIS — G4733 Obstructive sleep apnea (adult) (pediatric): Secondary | ICD-10-CM

## 2016-03-22 DIAGNOSIS — E1122 Type 2 diabetes mellitus with diabetic chronic kidney disease: Secondary | ICD-10-CM

## 2016-03-22 LAB — IRON AND TIBC
Iron: 331 ug/dL — ABNORMAL HIGH (ref 28–170)
SATURATION RATIOS: 102 % — AB (ref 10.4–31.8)
TIBC: 323 ug/dL (ref 250–450)
UIBC: NEGATIVE ug/dL

## 2016-03-22 LAB — GLUCOSE, CAPILLARY
GLUCOSE-CAPILLARY: 83 mg/dL (ref 65–99)
Glucose-Capillary: 136 mg/dL — ABNORMAL HIGH (ref 65–99)
Glucose-Capillary: 131 mg/dL — ABNORMAL HIGH (ref 65–99)

## 2016-03-22 LAB — TYPE AND SCREEN
ABO/RH(D): O POS
Antibody Screen: NEGATIVE
UNIT DIVISION: 0
UNIT DIVISION: 0

## 2016-03-22 LAB — CBC
HCT: 31.4 % — ABNORMAL LOW (ref 36.0–46.0)
HEMOGLOBIN: 9.9 g/dL — AB (ref 12.0–15.0)
MCH: 25.1 pg — AB (ref 26.0–34.0)
MCHC: 31.5 g/dL (ref 30.0–36.0)
MCV: 79.5 fL (ref 78.0–100.0)
PLATELETS: 197 10*3/uL (ref 150–400)
RBC: 3.95 MIL/uL (ref 3.87–5.11)
RDW: 17.1 % — AB (ref 11.5–15.5)
WBC: 9.9 10*3/uL (ref 4.0–10.5)

## 2016-03-22 LAB — FERRITIN: Ferritin: 120 ng/mL (ref 11–307)

## 2016-03-22 LAB — VITAMIN B12: Vitamin B-12: 780 pg/mL (ref 180–914)

## 2016-03-22 LAB — FOLATE: Folate: 18.4 ng/mL (ref >5.9)

## 2016-03-22 MED ORDER — DICYCLOMINE HCL 10 MG PO CAPS
10.0000 mg | ORAL_CAPSULE | Freq: Three times a day (TID) | ORAL | Status: DC
  Administered 2016-03-22 (×2): 10 mg via ORAL
  Filled 2016-03-22 (×2): qty 1

## 2016-03-22 MED ORDER — NYSTATIN 100000 UNIT/GM EX POWD: 1.0000 | Freq: Two times a day (BID) | CUTANEOUS | 0 refills | Status: DC | PRN

## 2016-03-22 MED ORDER — BUDESONIDE 3 MG PO CPEP: 9.0000 mg | ORAL_CAPSULE | Freq: Every day | ORAL | 0 refills | Status: DC

## 2016-03-22 MED ORDER — HYDRALAZINE HCL 20 MG/ML IJ SOLN
10.0000 mg | Freq: Four times a day (QID) | INTRAMUSCULAR | Status: DC | PRN
  Filled 2016-03-22: qty 1

## 2016-03-22 MED ORDER — DICYCLOMINE HCL 10 MG PO CAPS: 10.0000 mg | ORAL_CAPSULE | Freq: Three times a day (TID) | ORAL | 0 refills | Status: DC

## 2016-03-22 NOTE — Progress Notes (Signed)
During RT assessment and Breo administration- RT discussed with PT diagnosis of OSA. PT states she is unable and unwilling to utilize CPAP / BiPAP. RT educated PT on  benefits and consequences of compliance / non-compliance of PAP usage. PT states she understands.

## 2016-03-22 NOTE — Progress Notes (Signed)
Patient eventually agreed to go home tonight. Discharged to home family at bedside. No complaints made upon discharged. PIV d/c'd no s/s of swelling or infiltration noted.

## 2016-03-22 NOTE — Consult Note (Signed)
   [facility] Inpatient Consult   03/22/2016  BLU MCGLAUN 12-08-1940 [id number]   Patient screened for [facility] services. Went to bedside to offer and explain [facility] Care Management program with patient. Mrs. Harnden reports she has been called in the past from [facility]. She denies having any [facility] Care Management needs at this time. Accepted [facility] Care Management brochure with contact information to call in future if changes mind. Made inpatient RNCM aware that patient declined [facility] program services.  Marthenia Rolling, MSN-Ed, RN,BSN [facility] Liaison [phone]

## 2016-03-22 NOTE — Progress Notes (Signed)
Patient finished her dinner 30 mins ago but still complains of abdominal pain, she stated that  she is not comfortable going home tonight .  She said that has not had any BM yet  and scared that she will have bloody BM again when she gets home. MD paged awaiting to call back.

## 2016-03-22 NOTE — Discharge Summary (Signed)
Physician Discharge Summary  [name]:[id number] DOB: 09-Jul-1941 DOA: 03/19/2016  PCP: [name], MD  Admit date: 03/19/2016 Discharge date: 03/22/2016   Recommendations for Outpatient Follow-Up:   1. Resume home O2 2. endocort 1 month Be sure patient is taking Fe BID Check BP At next visit  Discharge Diagnosis:   Active Problems:   Morbid obesity (HCC)   HTN (hypertension)   ULCERATIVE COLITIS-LEFT SIDE   CKD (chronic kidney disease), stage III   Monoclonal paraproteinemia   OSA (obstructive sleep apnea)   Anemia, iron deficiency   COLD (chronic obstructive lung disease) (HCC)   Lower GI bleed   Chronic diastolic CHF (congestive heart failure), NYHA class 2 ([place])   Diabetes mellitus ([place])   Discharge disposition:  Home with home health  Discharge Condition: Improved.  Diet recommendation: soft diet  Wound care: None.   History of Present Illness:   [name] is a 75 y.o. female with medical history significant of hx of  segmental colitis 2009, HTN, CK D, COPD on home 02., OSA, morbid obesity, MGUS, diastolic heart failure, diet controlled DM    Presented with red stool which started today after midnight she had had 5 soft stools since [place] stools were red but had some black and and again no vomiting associated that prior to this patient ate Chia seeds and she is concerned that that has caused bleeding. Associated with abdominal discomfort no fever no chills no chest pain or shortness of breath no lightheadedness no syncope or shin up having a total of 6 stools none since arrival to emergency department,  abdomnal discomfort mainly in the right lower quadrant which is moderate not severe. During that patient endorses chronic lower extremity edema which is unchanged, Patient has chronic small amount of blood in sttol but tonight is was very significant.       Hospital Course by Problem:   Lower GI bleed   -nosigns of re-bleeding -recent  evaluation for IDA witha colonoscopy and EGD on7/27/16 withknown AVMs -seen by GI prob AVM or diverticular bleed  Anemia, iron deficiencylikely secondary to chronic blood loss -IV fe and then PO replacement at home  COLD (chronic obstructive lung disease) (HCC)on home oxygen continue home medications (2L O2 at home)  HTN (hypertension)continue metoprolol to avoid rebound tachycardia -resume all home meds  Morbid obesity (HCC) - chronic, BMI 46  OSA (obstructive sleep apnea)per patient she is not using C Pap  ULCERATIVE COLITIS- -entocort for 1 month  CKD (chronic kidney disease), stage IIIchronic creatinine currently at baseline.  Chronic diastolic CHF (congestive heart failure), NYHA class 2 (HCC)  -resume lasix    Medical Consultants:    GI   Discharge Exam:   Vitals:   03/21/16 2130 03/22/16 0514  BP: (!) 164/46 (!) 163/61  Pulse: 64 62  Resp: 18 16  Temp: 98.1 F (36.7 C) 98.4 F (36.9 C)   Vitals:   03/21/16 1500 03/21/16 2130 03/22/16 0514 03/22/16 1003  BP: (!) 184/62 (!) 164/46 (!) 163/61   Pulse:  64 62   Resp:  18 16   Temp:  98.1 F (36.7 C) 98.4 F (36.9 C)   TempSrc:  Oral Oral   SpO2:  97% 99% 98%  Weight:   107.2 kg (236 lb 5.3 oz)   Height:        Gen:  NAD   The results of significant diagnostics from this hospitalization (including imaging, microbiology, ancillary and laboratory) are listed below  for reference.     Procedures and Diagnostic Studies:   Ct Abdomen Pelvis W Contrast  Result Date: 03/19/2016 CLINICAL DATA:  Lower abdominal pain, history of kidney stones and colitis EXAM: CT ABDOMEN AND PELVIS WITH CONTRAST TECHNIQUE: Multidetector CT imaging of the abdomen and pelvis was performed using the standard protocol following bolus administration of intravenous contrast. CONTRAST:  59m ISOVUE-300 IOPAMIDOL (ISOVUE-300) INJECTION 61% COMPARISON:  None. FINDINGS: Lower chest: Lung bases are notable for a right  posterior Bochdalek's hernia. Hepatobiliary: Liver is within normal limits. Gallbladder is unremarkable. No intrahepatic or extrahepatic ductal dilatation. Pancreas: Within normal limits. Spleen: Within normal limits. Adrenals/Urinary Tract: Adrenal glands are within normal limits. 15 mm cyst with layering hemorrhage posteriorly along the posterior left upper kidney (series 5/ image 7). Additional simple bilateral renal cysts measuring up to 2.4 cm along the anterior right lower kidney (series 2/ image 37). No hydronephrosis. Bladder is within normal limits. Stomach/Bowel: Stomach is within normal limits. No evidence of bowel obstruction. Normal appendix (series 2/image 48). Moderate periumbilical hernia containing fat, a nondilated loop of transverse colon (series 2/ image 53), and the distal appendix (series 2/image 52). Sigmoid diverticulosis, without evidence of diverticulitis. No colonic wall thickening or inflammatory changes. Vascular/Lymphatic: Atherosclerotic calcifications of the abdominal aorta and branch vessels. No evidence of abdominal aortic aneurysm. No suspicious abdominopelvic lymphadenopathy. Reproductive: Uterus is within normal limits. Bilateral ovaries are unremarkable. Other: No abdominopelvic ascites. Musculoskeletal: Degenerative changes of the visualized thoracolumbar spine. Mild superior endplate Schmorl's node deformities with loss of height involving the mid vertebral bodies at L3 and L4 (sagittal image 124). IMPRESSION: No evidence of bowel obstruction.  Normal appendix. Sigmoid diverticulosis, without associated inflammatory changes. Moderate periumbilical hernia containing fat, nondilated transverse colon, and the distal appendix. Additional ancillary findings as above. Electronically Signed   By: SJulian HyM.D.   On: 03/19/2016 18:32     Labs:   Basic Metabolic Panel:  Recent Labs Lab 03/19/16 1142 03/20/16 0513 03/21/16 0644  NA 136 138 139  K 4.0 4.1 4.4  CL  101 106 108  CO2 28 27 27   GLUCOSE 133* 109* 111*  BUN 31* 23* 16  CREATININE 1.39* 1.31* 1.34*  CALCIUM 9.0 8.9 9.2  MG  --  2.4  --   PHOS  --  3.4  --    GFR Estimated Creatinine Clearance: 40.2 mL/min (by C-G formula based on SCr of 1.34 mg/dL). Liver Function Tests:  Recent Labs Lab 03/19/16 1142 03/20/16 0513  AST 15 14*  ALT 11* 10*  ALKPHOS 74 65  BILITOT 0.2* 0.3  PROT 7.3 6.8  ALBUMIN 3.4* 3.0*   No results for input(s): LIPASE, AMYLASE in the last 168 hours. No results for input(s): AMMONIA in the last 168 hours. Coagulation profile No results for input(s): INR, PROTIME in the last 168 hours.  CBC:  Recent Labs Lab 03/19/16 2131 03/20/16 0513 03/20/16 1139 03/20/16 1720 03/21/16 0644 03/22/16 0901  WBC 9.1 7.7 6.8  --  8.7 9.9  HGB 7.6* 7.4* 7.3* 6.9* 9.9* 9.9*  HCT 24.5* 23.8* 23.8* 22.6* 30.6* 31.4*  MCV 75.2* 76.3* 76.5*  --  77.1* 79.5  PLT 225 217 222  --  210 197   Cardiac Enzymes: No results for input(s): CKTOTAL, CKMB, CKMBINDEX, TROPONINI in the last 168 hours. BNP: Invalid input(s): POCBNP CBG:  Recent Labs Lab 03/21/16 1230 03/21/16 1719 03/21/16 2131 03/22/16 0801 03/22/16 1215  GLUCAP 132* 109* 126* 83 136*   D-Dimer No  results for input(s): DDIMER in the last 72 hours. Hgb A1c  Recent Labs  03/20/16 0513  HGBA1C 5.9*   Lipid Profile No results for input(s): CHOL, HDL, LDLCALC, TRIG, CHOLHDL, LDLDIRECT in the last 72 hours. Thyroid function studies  Recent Labs  03/20/16 0513  TSH 2.466   Anemia work up  Recent Labs  03/21/16 2232  VITAMINB12 780  FOLATE 18.4  FERRITIN 120  TIBC 323  IRON 331*  RETICCTPCT 1.6   Microbiology No results found for this or any previous visit (from the past 240 hour(s)).   Discharge Instructions:   Discharge Instructions    Discharge instructions    Complete by:  As directed   Soft diet Would get over the counter vit D and iron pills-- [employer] or [employer] would be  cost effective Iron can be constipating so please have a bowel regimen to use at home (miralax etc) Cont home O2   Increase activity slowly    Complete by:  As directed       Medication List    STOP taking these medications   ketoconazole 2 % cream Commonly known as:  NIZORAL     TAKE these medications   acetaminophen 325 MG tablet Commonly known as:  TYLENOL Take 2 tablets (650 mg total) by mouth every 6 (six) hours as needed for mild pain or headache (or Fever >/= 101).   albuterol 108 (90 Base) MCG/ACT inhaler Commonly known as:  PROAIR HFA Inhale 1-2 puffs into the lungs every 6 (six) hours as needed for wheezing or shortness of breath. What changed:  additional instructions   albuterol (2.5 MG/3ML) 0.083% nebulizer solution Commonly known as:  PROVENTIL Take 3 mLs (2.5 mg total) by nebulization every 6 (six) hours as needed for wheezing or shortness of breath. What changed:  additional instructions   allopurinol 100 MG tablet Commonly known as:  ZYLOPRIM TAKE 1 TABLET BY MOUTH EVERY DAY FOR GOUT What changed:  See the new instructions.   aspirin 81 MG EC tablet Take 81 mg by mouth daily.   budesonide 3 MG 24 hr capsule Commonly known as:  ENTOCORT EC Take 3 capsules (9 mg total) by mouth daily.   cyanocobalamin 2000 MCG tablet Take 1 tablet (2,000 mcg total) by mouth daily. What changed:  when to take this   dicyclomine 10 MG capsule Commonly known as:  BENTYL Take 1 capsule (10 mg total) by mouth 4 (four) times daily -  before meals and at bedtime.   ferrous sulfate 325 (65 FE) MG tablet Take 1 tablet (325 mg total) by mouth 2 (two) times daily with a meal.   fluticasone furoate-vilanterol 100-25 MCG/INH Aepb Commonly known as:  BREO ELLIPTA Inhale 1 puff into the lungs daily.   furosemide 40 MG tablet Commonly known as:  LASIX TAKE 1 TABLET BY MOUTH TWICE DAILY   losartan 100 MG tablet Commonly known as:  COZAAR TAKE 1 TABLET BY MOUTH EVERY DAY.     metoprolol 50 MG tablet Commonly known as:  LOPRESSOR TAKE 1 TABLET BY MOUTH TWICE DAILY   nystatin powder Commonly known as:  MYCOSTATIN/NYSTOP Apply 1 Bottle topically 2 (two) times daily as needed (skin irritation.). Use as directed twice per day as needed What changed:  how much to take  how to take this  when to take this  reasons to take this   omeprazole 40 MG capsule Commonly known as:  PRILOSEC Take 1 tablet 30 min before breakfast.  triamcinolone cream 0.5 % Commonly known as:  KENALOG Apply 1 application topically 3 (three) times daily.   VASCULERA Tabs Take 1 tablet by mouth daily.   Vitamin D (Ergocalciferol) 50000 units Caps capsule Commonly known as:  DRISDOL TAKE ONE CAPSULE BY MOUTH ONCE WEEKLY         Time coordinating discharge: 35 min  Signed:  Chris Narasimhan U Peterson Mathey   [facility] 03/22/2016, 12:50 PM

## 2016-03-22 NOTE — Progress Notes (Signed)
Physical Therapy Treatment Patient Details Name: CONNI KNIGHTON MRN: [id number] DOB: 08-Oct-1940 Today's Date: 03/22/2016    History of Present Illness 75 yo female admitted with LGIB. Hx of COPD-O2 dep, chronic LE swelling, HTN, morbid obesity, CKD.     PT Comments    Progressing with mobility. Pt fatigues fairly easily. Used RW for ambulation on today. O2 sats 89% on 2L O2.   Follow Up Recommendations  Home health PT;Supervision - Intermittent     Equipment Recommendations  None recommended by PT    Recommendations for Other Services       Precautions / Restrictions Precautions Precautions: Fall Precaution Comments: monitor O2 sats Restrictions Weight Bearing Restrictions: No    Mobility  Bed Mobility Overal bed mobility: Needs Assistance Bed Mobility: Supine to Sit     Supine to sit: Min guard;HOB elevated     General bed mobility comments: close guard for safety  Transfers Overall transfer level: Needs assistance Equipment used: Rolling walker (2 wheeled) Transfers: Sit to/from Stand Sit to Stand: Min guard;Min assist         General transfer comment: assist to rise from low surface without bar/armrests. Increased time.   Ambulation/Gait   Ambulation Distance (Feet): 45 Feet (x2) Assistive device: Rolling walker (2 wheeled) Gait Pattern/deviations: Step-through pattern;Decreased stride length;Trunk flexed     General Gait Details: close guard for safety. Seated rest break needed after 1st 45 feet. Dyspnea 2-3/4. O2 sats 89% on 2L O2.    Stairs            Wheelchair Mobility    Modified Rankin (Stroke Patients Only)       Balance                                    Cognition Arousal/Alertness: Awake/alert Behavior During Therapy: WFL for tasks assessed/performed Overall Cognitive Status: Within Functional Limits for tasks assessed                      Exercises      General Comments        Pertinent  Vitals/Pain Pain Assessment: No/denies pain    Home Living                      Prior Function            PT Goals (current goals can now be found in the care plan section) Progress towards PT goals: Progressing toward goals    Frequency  Min 3X/week    PT Plan Current plan remains appropriate    Co-evaluation             End of Session Equipment Utilized During Treatment: Oxygen Activity Tolerance: Patient limited by fatigue Patient left: in chair;with call bell/phone within reach     Time: 8295-6213 PT Time Calculation (min) (ACUTE ONLY): 20 min  Charges:  $Gait Training: 8-22 mins                    G Codes:      Weston Anna, MPT Pager: [phone]

## 2016-03-22 NOTE — Progress Notes (Signed)
Pharmacy: Re- iron repletion  Patient is a 75 y.o with hx iron deficiency anemia, presented to the ED on 8/21 with hematochezia.  Hgb 7.8 on admission --> s/p 2 units PRBC.  Patient received Feraheme 510 mg x1 on 8/22.  Pharmacy was asked to dose IV iron on 8/23. Anemia panel on 8/23:  iron 331, TIBC 323, Tsat 102, ferritin 120.   Spoke to [employer] from GI, ok to d/c IV iron.  Dr. Eliseo Squires will resume home PO iron at discharge.  Pharmacy will sign off.  Re-consult [place] if need further assistance.  Thank you for asking pharmacy to participate in this patient's care.  Dia Sitter, PharmD, BCPS 03/22/2016 10:56 AM

## 2016-03-22 NOTE — Progress Notes (Signed)
Pt selected [facility], referral given to in  House rep.

## 2016-03-22 NOTE — Progress Notes (Addendum)
[facility] Progress Note  Subjective:  No further rectal bleeding.  No BM since admission.  Still having abdominal pain that comes and goes; about the same as it was on admission.  Is hungry though and would like to try to advance diet.  Objective:  Vital signs in last 24 hours: Temp:  [98.1 F (36.7 C)-98.4 F (36.9 C)] 98.4 F (36.9 C) (08/24 0514) Pulse Rate:  [57-71] 62 (08/24 0514) Resp:  [16-18] 16 (08/24 0514) BP: (152-184)/(46-82) 163/61 (08/24 0514) SpO2:  [96 %-99 %] 99 % (08/24 0514) Weight:  [236 lb 5.3 oz (107.2 kg)] 236 lb 5.3 oz (107.2 kg) (08/24 0514) Last BM Date: 03/19/16 General:  Alert, Well-developed, in NAD Heart:  Regular rate and rhythm; no murmurs Pulm:  CTAB.  Decreased BS B/L. Abdomen:  Soft, obese, non-distended.  BS present.  Mild diffuse TTP.  Abdominal hernia noted. Extremities:  Without edema. Neurologic:  Alert and oriented x 4;  grossly normal neurologically. Psych:  Alert and cooperative. Normal mood and affect.  Intake/Output from previous day: 08/23 0701 - 08/24 0700 In: 3 [I.V.:3] Out: 550 [Urine:550] Intake/Output this shift: Total I/O In: 3 [I.V.:3] Out: -   Lab Results:  Recent Labs  03/20/16 1139 03/20/16 1720 03/21/16 0644 03/22/16 0901  WBC 6.8  --  8.7 9.9  HGB 7.3* 6.9* 9.9* 9.9*  HCT 23.8* 22.6* 30.6* 31.4*  PLT 222  --  210 197   BMET  Recent Labs  03/19/16 1142 03/20/16 0513 03/21/16 0644  NA 136 138 139  K 4.0 4.1 4.4  CL 101 106 108  CO2 28 27 27   GLUCOSE 133* 109* 111*  BUN 31* 23* 16  CREATININE 1.39* 1.31* 1.34*  CALCIUM 9.0 8.9 9.2   LFT  Recent Labs  03/20/16 0513  PROT 6.8  ALBUMIN 3.0*  AST 14*  ALT 10*  ALKPHOS 65  BILITOT 0.3   Assessment / Plan: 1. Hematochezia/abdominal pain:Patient with history of chronic activecolitis in the sigmoid colon on colonoscopy in 2009, no further signs of this in 2016 at time of last colonoscopy.  Current episode was associated  with a lot of abdominal cramping and continued pain.  After review of previous coloninflammation seen in area of multiple diverticula-inflammation likely represented SCAD (segmental colitis associated with diverticulosis) also known history of AVMs and anemia;consider most likley SCAD vsischemic colitis versus UC.  Started on budesonide 9 mg daily 8/22.  CT scan this admission ok. 3. Iron deficiency anemia:Evaluated with EGD and colonoscopy last year, multiple AVMs which were thought to be the cause.  Patient says that she cannot use iron supplementation prescribed by her doctor is too expensive. Hgb down to 6.9 grams shortly after admission for which she was transfuse with 2 units PRBC's; Hgb has remained stable at 9.9 grams since then.  Received one dose of IV iron and iron studies back last night are actually high. 4. COLD(chronic obstructive lung disease):Patient on home oxygen. 5. Chronic diastolic CHF 6. CKD, stage III 7. Morbid obesity 8. Abdominal hernia:Continue to observe, this is been discussed with the patient in the past.  *Continue to monitor Hgb and transfuse further PRN. *No plans for further endoscopic evaluation at this time due to previous evaluations and multiple co-morbidities. *Continue Entocort (budesonide 9 mg daily). *Will try full liquids today. *Will add Bentyl 10 mg ACHS to see if this helps further with abdominal pain/cramping.   LOS: 3 days   ZEHR, JESSICA D.  03/22/2016,  9:32 AM  Pager number [phone]    Attending physician's note   I have taken an interval history, reviewed the chart and examined the patient. I agree with the Advanced Practitioner's note, impression and recommendations.  GI bleed has resolved-likely a diverticular or AVM bleed, possible segmental colitis.  Advance to full liquids and then soft diet as tolerated.  Add Bentyl 10 mg ac and hs for mild abdominal cramping/pain for inpt and outpt use.  Continue Entocort for 1 month for  possible segmental colits and then discontinue.  Iron twice daily at discharge.  Follow up with PCP. GI follow up as needed. GI signing off.   Lucio Edward, MD [facility] [phone] Mon-Fri 8a-5p [phone] after 5p, weekends, holidays

## 2016-03-24 DIAGNOSIS — J449 Chronic obstructive pulmonary disease, unspecified: Secondary | ICD-10-CM | POA: Diagnosis not present

## 2016-03-26 ENCOUNTER — Telehealth: Payer: Self-pay

## 2016-03-26 NOTE — Telephone Encounter (Signed)
fyi to PCP

## 2016-03-26 NOTE — Telephone Encounter (Signed)
Home health called to give a update. The order said to start today. But they are unable to start today they will be starting tomorrow. Home health wanted you to be aware. Thank you.

## 2016-03-27 ENCOUNTER — Telehealth: Payer: Self-pay | Admitting: Internal Medicine

## 2016-03-27 NOTE — Telephone Encounter (Signed)
Fyi.

## 2016-03-27 NOTE — Telephone Encounter (Signed)
Seen patient for start of care admission.  Patient states she will start driving tomorrow thus does not need care.  Does state that patient is doing really well and has started regular duties and patient does not feel she needs PT.

## 2016-03-29 ENCOUNTER — Ambulatory Visit (INDEPENDENT_AMBULATORY_CARE_PROVIDER_SITE_OTHER): Payer: Medicare Other | Admitting: Internal Medicine

## 2016-03-29 ENCOUNTER — Encounter: Payer: Self-pay | Admitting: Internal Medicine

## 2016-03-29 ENCOUNTER — Other Ambulatory Visit (INDEPENDENT_AMBULATORY_CARE_PROVIDER_SITE_OTHER): Payer: Medicare Other

## 2016-03-29 VITALS — BP 138/56 | HR 58 | Temp 97.9°F | Resp 16 | Ht 60.0 in | Wt 234.5 lb

## 2016-03-29 DIAGNOSIS — D519 Vitamin B12 deficiency anemia, unspecified: Secondary | ICD-10-CM

## 2016-03-29 DIAGNOSIS — K922 Gastrointestinal hemorrhage, unspecified: Secondary | ICD-10-CM | POA: Diagnosis not present

## 2016-03-29 DIAGNOSIS — Z23 Encounter for immunization: Secondary | ICD-10-CM | POA: Diagnosis not present

## 2016-03-29 DIAGNOSIS — Z5189 Encounter for other specified aftercare: Secondary | ICD-10-CM | POA: Diagnosis not present

## 2016-03-29 DIAGNOSIS — I10 Essential (primary) hypertension: Secondary | ICD-10-CM

## 2016-03-29 DIAGNOSIS — D509 Iron deficiency anemia, unspecified: Secondary | ICD-10-CM

## 2016-03-29 DIAGNOSIS — K5150 Left sided colitis without complications: Secondary | ICD-10-CM

## 2016-03-29 LAB — CBC WITH DIFFERENTIAL/PLATELET
Basophils Absolute: 0.1 10*3/uL (ref 0.0–0.1)
Basophils Relative: 1.2 % (ref 0.0–3.0)
EOS ABS: 0.2 10*3/uL (ref 0.0–0.7)
EOS PCT: 1.5 % (ref 0.0–5.0)
HEMATOCRIT: 32.9 % — AB (ref 36.0–46.0)
HEMOGLOBIN: 10.8 g/dL — AB (ref 12.0–15.0)
LYMPHS PCT: 13 % (ref 12.0–46.0)
Lymphs Abs: 1.7 10*3/uL (ref 0.7–4.0)
MCHC: 33 g/dL (ref 30.0–36.0)
MCV: 77 fl — ABNORMAL LOW (ref 78.0–100.0)
MONOS PCT: 6.2 % (ref 3.0–12.0)
Monocytes Absolute: 0.8 10*3/uL (ref 0.1–1.0)
Neutro Abs: 9.9 10*3/uL — ABNORMAL HIGH (ref 1.4–7.7)
Neutrophils Relative %: 78.1 % — ABNORMAL HIGH (ref 43.0–77.0)
Platelets: 242 10*3/uL (ref 150.0–400.0)
RBC: 4.27 Mil/uL (ref 3.87–5.11)
RDW: 20.3 % — AB (ref 11.5–15.5)
WBC: 12.7 10*3/uL — AB (ref 4.0–10.5)

## 2016-03-29 LAB — BASIC METABOLIC PANEL
BUN: 30 mg/dL — AB (ref 6–23)
CO2: 35 meq/L — AB (ref 19–32)
CREATININE: 1.23 mg/dL — AB (ref 0.40–1.20)
Calcium: 9.3 mg/dL (ref 8.4–10.5)
Chloride: 99 mEq/L (ref 96–112)
GFR: 45.16 mL/min — ABNORMAL LOW (ref >60.00)
GLUCOSE: 124 mg/dL — AB (ref 70–99)
Potassium: 3.6 mEq/L (ref 3.5–5.1)
SODIUM: 137 meq/L (ref 135–145)

## 2016-03-29 NOTE — Progress Notes (Signed)
Pre visit review using our clinic review tool, if applicable. No additional management support is needed unless otherwise documented below in the visit note. 

## 2016-03-29 NOTE — Progress Notes (Signed)
Subjective:  Patient ID: [name], female    DOB: 1941/02/10  Age: 75 y.o. MRN: [id number]  CC: Anemia   HPI MAUREENA DABBS presents for Follow-up after recent admission for lower GI bleed that required transfusions with 2 packs of red blood cells and an iron injection. She'll also much blood that she felt short of breath, weak, and fatigue. All of those symptoms have improved over the last week. She has not noticed any more blood in her stools. She denies abdominal pain, nausea, vomiting, diarrhea, constipation, syncope.  Outpatient Medications Prior to Visit  Medication Sig Dispense Refill  . acetaminophen (TYLENOL) 325 MG tablet Take 2 tablets (650 mg total) by mouth every 6 (six) hours as needed for mild pain or headache (or Fever >/= 101).    Marland Kitchen albuterol (PROAIR HFA) 108 (90 BASE) MCG/ACT inhaler Inhale 1-2 puffs into the lungs every 6 (six) hours as needed for wheezing or shortness of breath. (Patient taking differently: Inhale 1-2 puffs into the lungs every 6 (six) hours as needed for wheezing or shortness of breath. Patient take once a day instead of three times a day.) 18 g 11  . albuterol (PROVENTIL) (2.5 MG/3ML) 0.083% nebulizer solution Take 3 mLs (2.5 mg total) by nebulization every 6 (six) hours as needed for wheezing or shortness of breath. (Patient taking differently: Take 2.5 mg by nebulization every 6 (six) hours as needed for wheezing or shortness of breath. Patient only takes once a day instead of three times a day) 150 mL 11  . allopurinol (ZYLOPRIM) 100 MG tablet TAKE 1 TABLET BY MOUTH EVERY DAY FOR GOUT (Patient taking differently: Take one tablet by mouth every evening.) 90 tablet 0  . aspirin 81 MG EC tablet Take 81 mg by mouth daily.      . budesonide (ENTOCORT EC) 3 MG 24 hr capsule Take 3 capsules (9 mg total) by mouth daily. 90 capsule 0  . cyanocobalamin 2000 MCG tablet Take 1 tablet (2,000 mcg total) by mouth daily. (Patient taking differently: Take 2,000  mcg by mouth every evening. ) 90 tablet 3  . dicyclomine (BENTYL) 10 MG capsule Take 1 capsule (10 mg total) by mouth 4 (four) times daily -  before meals and at bedtime. 120 capsule 0  . Dietary Management Product (VASCULERA) TABS Take 1 tablet by mouth daily. 30 tablet 11  . ferrous sulfate 325 (65 FE) MG tablet Take 1 tablet (325 mg total) by mouth 2 (two) times daily with a meal. 60 tablet 11  . Fluticasone Furoate-Vilanterol (BREO ELLIPTA) 100-25 MCG/INH AEPB Inhale 1 puff into the lungs daily. 30 each 11  . furosemide (LASIX) 40 MG tablet TAKE 1 TABLET BY MOUTH TWICE DAILY 180 tablet 0  . losartan (COZAAR) 100 MG tablet TAKE 1 TABLET BY MOUTH EVERY DAY. 90 tablet 3  . metoprolol (LOPRESSOR) 50 MG tablet TAKE 1 TABLET BY MOUTH TWICE DAILY 180 tablet 1  . nystatin (MYCOSTATIN/NYSTOP) powder Apply 1 Bottle topically 2 (two) times daily as needed (skin irritation.). Use as directed twice per day as needed 1 Bottle 0  . triamcinolone cream (KENALOG) 0.5 % Apply 1 application topically 3 (three) times daily. 100 g 1  . Vitamin D, Ergocalciferol, (DRISDOL) 50000 UNITS CAPS capsule TAKE ONE CAPSULE BY MOUTH ONCE WEEKLY 12 capsule 3  . omeprazole (PRILOSEC) 40 MG capsule Take 1 tablet 30 min before breakfast. 90 capsule 3   No facility-administered medications prior to visit.  ROS Review of Systems  Constitutional: Positive for fatigue. Negative for activity change, appetite change, chills, diaphoresis, fever and unexpected weight change.  HENT: Negative.  Negative for sore throat and trouble swallowing.   Eyes: Negative for visual disturbance.  Respiratory: Positive for shortness of breath. Negative for cough, choking, chest tightness, wheezing and stridor.   Cardiovascular: Negative.  Negative for chest pain, palpitations and leg swelling.  Gastrointestinal: Negative.  Negative for abdominal pain, anal bleeding, blood in stool, constipation, diarrhea, nausea and vomiting.  Endocrine:  Negative.   Genitourinary: Negative.   Musculoskeletal: Negative.  Negative for arthralgias, back pain, joint swelling and myalgias.  Skin: Negative.  Negative for color change and rash.  Allergic/Immunologic: Negative.   Neurological: Negative.  Negative for dizziness, weakness, light-headedness, numbness and headaches.  Hematological: Negative.  Negative for adenopathy. Does not bruise/bleed easily.  Psychiatric/Behavioral: Negative.     Objective:  BP (!) 138/56 (BP Location: Left Arm, Patient Position: Sitting, Cuff Size: Large)   Pulse (!) 58   Temp 97.9 F (36.6 C) (Oral)   Resp 16   Ht 5' (1.524 m)   Wt 234 lb 8 oz (106.4 kg)   SpO2 94%   BMI 45.80 kg/m   BP Readings from Last 3 Encounters:  03/29/16 (!) 138/56  03/22/16 (!) 169/47  02/23/16 124/62    Wt Readings from Last 3 Encounters:  03/29/16 234 lb 8 oz (106.4 kg)  03/22/16 236 lb 5.3 oz (107.2 kg)  02/23/16 241 lb (109.3 kg)    Physical Exam  Constitutional: She is oriented to person, place, and time. No distress.  HENT:  Mouth/Throat: Oropharynx is clear and moist. No oropharyngeal exudate.  Eyes: Conjunctivae are normal. Right eye exhibits no discharge. Left eye exhibits no discharge. No scleral icterus.  Neck: Normal range of motion. Neck supple. No JVD present. No tracheal deviation present. No thyromegaly present.  Cardiovascular: Normal rate, regular rhythm, normal heart sounds and intact distal pulses.  Exam reveals no gallop and no friction rub.   No murmur heard. Pulmonary/Chest: Effort normal and breath sounds normal. No stridor. No respiratory distress. She has no wheezes. She has no rales. She exhibits no tenderness.  Abdominal: Soft. Bowel sounds are normal. She exhibits no distension and no mass. There is no tenderness. There is no rebound and no guarding.  Musculoskeletal: Normal range of motion. She exhibits no edema, tenderness or deformity.  Lymphadenopathy:    She has no cervical  adenopathy.  Neurological: She is alert and oriented to person, place, and time.  Skin: Skin is warm and dry. No rash noted. She is not diaphoretic. No erythema. No pallor.  Psychiatric: She has a normal mood and affect. Her behavior is normal. Judgment and thought content normal.  Vitals reviewed.   Lab Results  Component Value Date   WBC 12.7 (H) 03/29/2016   HGB 10.8 (L) 03/29/2016   HCT 32.9 (L) 03/29/2016   PLT 242.0 03/29/2016   GLUCOSE 124 (H) 03/29/2016   CHOL 171 08/17/2015   TRIG 170.0 (H) 08/17/2015   HDL 43.70 08/17/2015   LDLDIRECT 135.3 11/20/2010   LDLCALC 93 08/17/2015   ALT 10 (L) 03/20/2016   AST 14 (L) 03/20/2016   NA 137 03/29/2016   K 3.6 03/29/2016   CL 99 03/29/2016   CREATININE 1.23 (H) 03/29/2016   BUN 30 (H) 03/29/2016   CO2 35 (H) 03/29/2016   TSH 2.466 03/20/2016   HGBA1C 5.9 (H) 03/20/2016   MICROALBUR 15.9 (H) 02/05/2013  Ct Abdomen Pelvis W Contrast  Result Date: 03/19/2016 CLINICAL DATA:  Lower abdominal pain, history of kidney stones and colitis EXAM: CT ABDOMEN AND PELVIS WITH CONTRAST TECHNIQUE: Multidetector CT imaging of the abdomen and pelvis was performed using the standard protocol following bolus administration of intravenous contrast. CONTRAST:  3m ISOVUE-300 IOPAMIDOL (ISOVUE-300) INJECTION 61% COMPARISON:  None. FINDINGS: Lower chest: Lung bases are notable for a right posterior Bochdalek's hernia. Hepatobiliary: Liver is within normal limits. Gallbladder is unremarkable. No intrahepatic or extrahepatic ductal dilatation. Pancreas: Within normal limits. Spleen: Within normal limits. Adrenals/Urinary Tract: Adrenal glands are within normal limits. 15 mm cyst with layering hemorrhage posteriorly along the posterior left upper kidney (series 5/ image 7). Additional simple bilateral renal cysts measuring up to 2.4 cm along the anterior right lower kidney (series 2/ image 37). No hydronephrosis. Bladder is within normal limits.  Stomach/Bowel: Stomach is within normal limits. No evidence of bowel obstruction. Normal appendix (series 2/image 48). Moderate periumbilical hernia containing fat, a nondilated loop of transverse colon (series 2/ image 53), and the distal appendix (series 2/image 52). Sigmoid diverticulosis, without evidence of diverticulitis. No colonic wall thickening or inflammatory changes. Vascular/Lymphatic: Atherosclerotic calcifications of the abdominal aorta and branch vessels. No evidence of abdominal aortic aneurysm. No suspicious abdominopelvic lymphadenopathy. Reproductive: Uterus is within normal limits. Bilateral ovaries are unremarkable. Other: No abdominopelvic ascites. Musculoskeletal: Degenerative changes of the visualized thoracolumbar spine. Mild superior endplate Schmorl's node deformities with loss of height involving the mid vertebral bodies at L3 and L4 (sagittal image 124). IMPRESSION: No evidence of bowel obstruction.  Normal appendix. Sigmoid diverticulosis, without associated inflammatory changes. Moderate periumbilical hernia containing fat, nondilated transverse colon, and the distal appendix. Additional ancillary findings as above. Electronically Signed   By: SJulian HyM.D.   On: 03/19/2016 18:32    Assessment & Plan:   BUlyssawas seen today for anemia.  Diagnoses and all orders for this visit:  Anemia, iron deficiency- Her H/H have improved, she reports no more episodes of lower GI bleeding, will continue iron replacement therapy. -     CBC with Differential/Platelet; Future -     Cancel: IBC panel; Future -     Cancel: Ferritin; Future  Vitamin B12 deficiency anemia- her H/H have improved, will continue B12 replacement therapy. -     CBC with Differential/Platelet; Future -     Cancel: Folate; Future -     Cancel: Vitamin B12; Future  Essential hypertension- her blood pressure is adequately well controlled, electrolytes and renal function are stable. -     Basic  metabolic panel; Future  Need for prophylactic vaccination and inoculation against influenza -     Flu vaccine HIGH DOSE PF (Fluzone High dose)  ULCERATIVE COLITIS-LEFT SIDE- she will continue to follow-up with GI about this.   I have discontinued Ms. Creasey's omeprazole. I am also having her maintain her aspirin, albuterol, acetaminophen, albuterol, fluticasone furoate-vilanterol, losartan, Vitamin D (Ergocalciferol), furosemide, metoprolol, allopurinol, VASCULERA, cyanocobalamin, ferrous sulfate, triamcinolone cream, budesonide, dicyclomine, and nystatin.  No orders of the defined types were placed in this encounter.    Follow-up: Return in about 4 months (around 07/29/2016).  TScarlette Calico MD

## 2016-03-29 NOTE — Patient Instructions (Signed)
Iron Deficiency Anemia, Adult Anemia is a condition in which there are less red blood cells or hemoglobin in the blood than normal. Hemoglobin is the part of red blood cells that carries oxygen. Iron deficiency anemia is anemia caused by too little iron. It is the most common type of anemia. It may leave you tired and short of breath. CAUSES   Lack of iron in the diet.  Poor absorption of iron, as seen with intestinal disorders.  Intestinal bleeding.  Heavy periods. SIGNS AND SYMPTOMS  Mild anemia may not be noticeable. Symptoms may include:  Fatigue.  Headache.  Pale skin.  Weakness.  Tiredness.  Shortness of breath.  Dizziness.  Cold hands and feet.  Fast or irregular heartbeat. DIAGNOSIS  Diagnosis requires a thorough evaluation and physical exam by your health care provider. Blood tests are generally used to confirm iron deficiency anemia. Additional tests may be done to find the underlying cause of your anemia. These may include:  Testing for blood in the stool (fecal occult blood test).  A procedure to see inside the colon and rectum (colonoscopy).  A procedure to see inside the esophagus and stomach (endoscopy). TREATMENT  Iron deficiency anemia is treated by correcting the cause of the deficiency. Treatment may involve:  Adding iron-rich foods to your diet.  Taking iron supplements. Pregnant or breastfeeding women need to take extra iron because their normal diet usually does not provide the required amount.  Taking vitamins. Vitamin C improves the absorption of iron. Your health care provider may recommend that you take your iron tablets with a glass of orange juice or vitamin C supplement.  Medicines to make heavy menstrual flow lighter.  Surgery. HOME CARE INSTRUCTIONS   Take iron as directed by your health care provider.  If you cannot tolerate taking iron supplements by mouth, talk to your health care provider about taking them through a vein  (intravenously) or an injection into a muscle.  For the best iron absorption, iron supplements should be taken on an empty stomach. If you cannot tolerate them on an empty stomach, you may need to take them with food.  Do not drink milk or take antacids at the same time as your iron supplements. Milk and antacids may interfere with the absorption of iron.  Iron supplements can cause constipation. Make sure to include fiber in your diet to prevent constipation. A stool softener may also be recommended.  Take vitamins as directed by your health care provider.  Eat a diet rich in iron. Foods high in iron include liver, lean beef, whole-grain bread, eggs, dried fruit, and dark green leafy vegetables. SEEK IMMEDIATE MEDICAL CARE IF:   You faint. If this happens, do not drive. Call your local emergency services (911 in [place] if no other help is available.  You have chest pain.  You feel nauseous or vomit.  You have severe or increased shortness of breath with activity.  You feel weak.  You have a rapid heartbeat.  You have unexplained sweating.  You become light-headed when getting up from a chair or bed. MAKE SURE YOU:   Understand these instructions.  Will watch your condition.  Will get help right away if you are not doing well or get worse.   This information is not intended to replace advice given to you by your health care provider. Make sure you discuss any questions you have with your health care provider.   Document Released: 07/13/2000 Document Revised: 08/06/2014 Document Reviewed: 03/23/2013 Elsevier   Interactive Patient Education 2016 [employer] Inc[employer]  

## 2016-03-31 ENCOUNTER — Other Ambulatory Visit: Payer: Self-pay | Admitting: Internal Medicine

## 2016-04-11 ENCOUNTER — Other Ambulatory Visit (HOSPITAL_BASED_OUTPATIENT_CLINIC_OR_DEPARTMENT_OTHER): Payer: Medicare Other

## 2016-04-11 DIAGNOSIS — D472 Monoclonal gammopathy: Secondary | ICD-10-CM | POA: Diagnosis not present

## 2016-04-11 DIAGNOSIS — D509 Iron deficiency anemia, unspecified: Secondary | ICD-10-CM | POA: Diagnosis not present

## 2016-04-11 DIAGNOSIS — D519 Vitamin B12 deficiency anemia, unspecified: Secondary | ICD-10-CM

## 2016-04-11 LAB — COMPREHENSIVE METABOLIC PANEL
ALBUMIN: 3 g/dL — AB (ref 3.5–5.0)
ALK PHOS: 82 U/L (ref 40–150)
ALT: 12 U/L (ref 0–55)
AST: 12 U/L (ref 5–34)
Anion Gap: 10 mEq/L (ref 3–11)
BILIRUBIN TOTAL: 0.35 mg/dL (ref 0.20–1.20)
BUN: 24.4 mg/dL (ref 7.0–26.0)
CALCIUM: 9.8 mg/dL (ref 8.4–10.4)
CO2: 31 mEq/L — ABNORMAL HIGH (ref 22–29)
Chloride: 101 mEq/L (ref 98–109)
Creatinine: 1.1 mg/dL (ref 0.6–1.1)
EGFR: 48 mL/min/{1.73_m2} — AB (ref >90)
Glucose: 78 mg/dl (ref 70–140)
POTASSIUM: 3.3 meq/L — AB (ref 3.5–5.1)
Sodium: 142 mEq/L (ref 136–145)
Total Protein: 7.5 g/dL (ref 6.4–8.3)

## 2016-04-11 LAB — IRON AND TIBC
%SAT: 28 % (ref 21–57)
IRON: 71 ug/dL (ref 41–142)
TIBC: 253 ug/dL (ref 236–444)
UIBC: 181 ug/dL (ref 120–384)

## 2016-04-12 LAB — KAPPA/LAMBDA LIGHT CHAINS
IG KAPPA FREE LIGHT CHAIN: 318.2 mg/L — AB (ref 3.3–19.4)
IG LAMBDA FREE LIGHT CHAIN: 22.8 mg/L (ref 5.7–26.3)
KAPPA/LAMBDA FLC RATIO: 13.96 — AB (ref 0.26–1.65)

## 2016-04-16 LAB — MULTIPLE MYELOMA PANEL, SERUM
ALBUMIN SERPL ELPH-MCNC: 3.4 g/dL (ref 2.9–4.4)
Albumin/Glob SerPl: 1 (ref 0.7–1.7)
Alpha 1: 0.3 g/dL (ref 0.0–0.4)
Alpha2 Glob SerPl Elph-Mcnc: 0.9 g/dL (ref 0.4–1.0)
B-Globulin SerPl Elph-Mcnc: 0.9 g/dL (ref 0.7–1.3)
Gamma Glob SerPl Elph-Mcnc: 1.5 g/dL (ref 0.4–1.8)
Globulin, Total: 3.5 g/dL (ref 2.2–3.9)
IGA/IMMUNOGLOBULIN A, SERUM: 127 mg/dL (ref 64–422)
IGM (IMMUNOGLOBIN M), SRM: 62 mg/dL (ref 26–217)
IgG, Qn, Serum: 1679 mg/dL — ABNORMAL HIGH (ref 700–1600)
M Protein SerPl Elph-Mcnc: 1.2 g/dL — ABNORMAL HIGH
TOTAL PROTEIN: 6.9 g/dL (ref 6.0–8.5)

## 2016-04-18 ENCOUNTER — Ambulatory Visit (HOSPITAL_BASED_OUTPATIENT_CLINIC_OR_DEPARTMENT_OTHER): Payer: Medicare Other | Admitting: Oncology

## 2016-04-18 VITALS — BP 163/59 | HR 59 | Temp 97.4°F | Resp 18 | Ht 60.0 in | Wt 235.2 lb

## 2016-04-18 DIAGNOSIS — N289 Disorder of kidney and ureter, unspecified: Secondary | ICD-10-CM

## 2016-04-18 DIAGNOSIS — R52 Pain, unspecified: Secondary | ICD-10-CM | POA: Diagnosis not present

## 2016-04-18 DIAGNOSIS — D472 Monoclonal gammopathy: Secondary | ICD-10-CM | POA: Diagnosis not present

## 2016-04-18 NOTE — Progress Notes (Signed)
Hematology and Oncology Follow Up Visit  [name] [id number] December 31, 1940 75 y.o. 04/18/2016 10:17 AM [name], MDJones, Laurie Right, MD   Principle Diagnosis: 75 year old woman with IgG kappa monoclonal gammopathy (MGUS) without any evidence of end organ damage. This was diagnosed in August of 2014. She presented with an M spike of about 1 g/dL on a mildly elevated IgG level of 1800.  Current therapy: Observation and surveillance.  Interim History: [name] presents today for a followup visit. Since her last visit, she was hospitalized in August 2017 for hematochezia which is mainly attributed to diverticulosis. She received intravenous iron and oral replacement and have been doing reasonably well since her discharge. She is no longer reporting any hematochezia and no melena. She is not reporting any abdominal pain or distention.   She is back to her baseline health and remains active. Walking with a cane. She still oxygen dependent bacilli test activities of daily living. She has not reported any recent increase in her bone pain or pathological fracture.  She does not report any headaches or blurry vision or syncope. She does not report any fevers, chills, sweats or weight loss. Her mobility and performance status is unchanged. Does not report any chest pain or difficulty breathing. She has not reported any nausea, vomiting or abdominal pain. Has not reported any urinary complaints. Rest of her review of systems unremarkable.  Medications: I have reviewed the patient's current medications.  Current Outpatient Prescriptions  Medication Sig Dispense Refill  . acetaminophen (TYLENOL) 325 MG tablet Take 2 tablets (650 mg total) by mouth every 6 (six) hours as needed for mild pain or headache (or Fever >/= 101).    Marland Kitchen albuterol (PROAIR HFA) 108 (90 BASE) MCG/ACT inhaler Inhale 1-2 puffs into the lungs every 6 (six) hours as needed for wheezing or shortness of breath. (Patient taking  differently: Inhale 1-2 puffs into the lungs every 6 (six) hours as needed for wheezing or shortness of breath. Patient take once a day instead of three times a day.) 18 g 11  . albuterol (PROVENTIL) (2.5 MG/3ML) 0.083% nebulizer solution Take 3 mLs (2.5 mg total) by nebulization every 6 (six) hours as needed for wheezing or shortness of breath. (Patient taking differently: Take 2.5 mg by nebulization every 6 (six) hours as needed for wheezing or shortness of breath. Patient only takes once a day instead of three times a day) 150 mL 11  . allopurinol (ZYLOPRIM) 100 MG tablet TAKE 1 TABLET BY MOUTH EVERY DAY FOR GOUT (Patient taking differently: Take one tablet by mouth every evening.) 90 tablet 0  . aspirin 81 MG EC tablet Take 81 mg by mouth daily.      . cyanocobalamin 2000 MCG tablet Take 1 tablet (2,000 mcg total) by mouth daily. (Patient taking differently: Take 2,000 mcg by mouth every evening. ) 90 tablet 3  . Dietary Management Product (VASCULERA) TABS Take 1 tablet by mouth daily. 30 tablet 11  . ferrous sulfate 325 (65 FE) MG tablet Take 1 tablet (325 mg total) by mouth 2 (two) times daily with a meal. 60 tablet 11  . Fluticasone Furoate-Vilanterol (BREO ELLIPTA) 100-25 MCG/INH AEPB Inhale 1 puff into the lungs daily. 30 each 11  . furosemide (LASIX) 40 MG tablet TAKE 1 TABLET BY MOUTH TWICE DAILY 180 tablet 0  . losartan (COZAAR) 100 MG tablet TAKE 1 TABLET BY MOUTH EVERY DAY. 90 tablet 3  . metoprolol (LOPRESSOR) 50 MG tablet TAKE 1 TABLET BY MOUTH  TWICE DAILY 180 tablet 1  . nystatin (MYCOSTATIN/NYSTOP) powder Apply 1 Bottle topically 2 (two) times daily as needed (skin irritation.). Use as directed twice per day as needed 1 Bottle 0  . triamcinolone cream (KENALOG) 0.5 % Apply 1 application topically 3 (three) times daily. 100 g 1  . Vitamin D, Ergocalciferol, (DRISDOL) 50000 UNITS CAPS capsule TAKE ONE CAPSULE BY MOUTH ONCE WEEKLY 12 capsule 3   No current facility-administered  medications for this visit.      Allergies:  Allergies  Allergen Reactions  . Celebrex [Celecoxib]     edema  . Enalapril Cough  . Lipitor [Atorvastatin]     Muscle aches  . Amlodipine Besylate     REACTION: edema  . Amoxicillin     REACTION: Diarrhea  . Codeine Sulfate     REACTION: Nausea  . Hydrocodone-Acetaminophen     REACTION: Nausea    Past Medical History, Surgical history, Social history, and Family History were reviewed and updated.  Physical Exam: Blood pressure (!) 163/59, pulse (!) 59, temperature 97.4 F (36.3 C), temperature source Oral, resp. rate 18, height 5' (1.524 m), weight 235 lb 3.2 oz (106.7 kg), SpO2 98 %. ECOG: 1 General appearance: Alert, awake woman without distress. Head: Normocephalic, without obvious abnormality no oral ulcers or lesions. Neck: no adenopathy Lymph nodes: Cervical, supraclavicular, and axillary nodes normal. Heart:regular rate and rhythm, S1, S2 normal, no murmur, click, rub or gallop Lung:chest clear, no wheezing, rales, normal symmetric air entry Abdomin: soft, non-tender, without masses or organomegaly EXT:no erythema, induration, or nodules.    Lab Results: Lab Results  Component Value Date   WBC 12.7 (H) 03/29/2016   HGB 10.8 (L) 03/29/2016   HCT 32.9 (L) 03/29/2016   MCV 77.0 (L) 03/29/2016   PLT 242.0 03/29/2016     Chemistry      Component Value Date/Time   NA 142 04/11/2016 1007   K 3.3 (L) 04/11/2016 1007   CL 99 03/29/2016 1458   CO2 31 (H) 04/11/2016 1007   BUN 24.4 04/11/2016 1007   CREATININE 1.1 04/11/2016 1007      Component Value Date/Time   CALCIUM 9.8 04/11/2016 1007   ALKPHOS 82 04/11/2016 1007   AST 12 04/11/2016 1007   ALT 12 04/11/2016 1007   BILITOT 0.35 04/11/2016 1007     Results for [name] (MRN [id number]) as of 04/18/2016 10:07  Ref. Range 04/14/2014 12:02 07/07/2014 14:46 04/05/2015 13:02 02/23/2016 16:18 04/11/2016 10:07  IgG (Immunoglobin G), Serum Latest Ref Range: 700 -  1600 mg/dL 1,960 (H) 1,790 (H) 1,330  1,679 (H)   Results for [name] (MRN [id number]) as of 04/18/2016 10:07  Ref. Range 04/11/2016 10:07  M Protein SerPl Elph-Mcnc Latest Ref Range: Not Observed g/dL 1.2 (H)    Impression and Plan:  75 year old woman with the following issues:  1. IgG kappa monoclonal protein represents MGUS without any evidence to suggest end organ damage or multiple myeloma diagnosed in 2014.   Her protein studies from 04/11/2016 were personally reviewed and they are unchanged for the last 4 years. Her M spike continues to be around 1 g/dL with IgG level on 1600.  I have recommended annual surveillance with repeat serum protein electrophoresis at that time. She is reluctant to return for a visit and prefers that Dr. Ronnald Ramp repeat those tests because of her frequent M.D. visits.  I have no objections to this strategy and I'm happy to review those results on annual basis.  I can see her if there is any abnormalities detected in the future. I anticipate of her M spike remains around 1 g/dL and IgG level in the range of 1600-2000 that no intervention is needed.  2. Diffuse pain: Under related to her plasma cell disorder more likely related to arthritis at this time.  3. Renal insufficiency: Her creatinine is close to baseline noted on 04/11/2016.  4. Follow-up: I'm happy to see her in the future as needed.   YSORTQ,SYHNP, MD 9/20/201710:17 AM

## 2016-04-19 ENCOUNTER — Other Ambulatory Visit: Payer: Self-pay | Admitting: Internal Medicine

## 2016-04-24 DIAGNOSIS — J449 Chronic obstructive pulmonary disease, unspecified: Secondary | ICD-10-CM | POA: Diagnosis not present

## 2016-05-17 ENCOUNTER — Other Ambulatory Visit: Payer: Self-pay | Admitting: Internal Medicine

## 2016-05-24 DIAGNOSIS — J449 Chronic obstructive pulmonary disease, unspecified: Secondary | ICD-10-CM | POA: Diagnosis not present

## 2016-06-05 ENCOUNTER — Other Ambulatory Visit: Payer: Self-pay | Admitting: Internal Medicine

## 2016-06-24 DIAGNOSIS — J449 Chronic obstructive pulmonary disease, unspecified: Secondary | ICD-10-CM | POA: Diagnosis not present

## 2016-07-19 ENCOUNTER — Other Ambulatory Visit: Payer: Self-pay | Admitting: Internal Medicine

## 2016-07-24 DIAGNOSIS — J449 Chronic obstructive pulmonary disease, unspecified: Secondary | ICD-10-CM | POA: Diagnosis not present

## 2016-08-09 ENCOUNTER — Other Ambulatory Visit: Payer: Self-pay | Admitting: Internal Medicine

## 2016-08-24 DIAGNOSIS — J449 Chronic obstructive pulmonary disease, unspecified: Secondary | ICD-10-CM | POA: Diagnosis not present

## 2016-09-04 ENCOUNTER — Other Ambulatory Visit (INDEPENDENT_AMBULATORY_CARE_PROVIDER_SITE_OTHER): Payer: Medicare Other

## 2016-09-04 ENCOUNTER — Ambulatory Visit (INDEPENDENT_AMBULATORY_CARE_PROVIDER_SITE_OTHER): Payer: Medicare Other | Admitting: Internal Medicine

## 2016-09-04 VITALS — BP 128/58 | HR 69 | Temp 98.0°F | Ht 60.0 in | Wt 233.0 lb

## 2016-09-04 DIAGNOSIS — D508 Other iron deficiency anemias: Secondary | ICD-10-CM

## 2016-09-04 DIAGNOSIS — R7303 Prediabetes: Secondary | ICD-10-CM

## 2016-09-04 DIAGNOSIS — I10 Essential (primary) hypertension: Secondary | ICD-10-CM | POA: Diagnosis not present

## 2016-09-04 DIAGNOSIS — D518 Other vitamin B12 deficiency anemias: Secondary | ICD-10-CM

## 2016-09-04 DIAGNOSIS — E785 Hyperlipidemia, unspecified: Secondary | ICD-10-CM

## 2016-09-04 DIAGNOSIS — J038 Acute tonsillitis due to other specified organisms: Secondary | ICD-10-CM | POA: Diagnosis not present

## 2016-09-04 LAB — CBC WITH DIFFERENTIAL/PLATELET
BASOS PCT: 1 % (ref 0.0–3.0)
Basophils Absolute: 0.1 10*3/uL (ref 0.0–0.1)
EOS ABS: 0.4 10*3/uL (ref 0.0–0.7)
Eosinophils Relative: 3.5 % (ref 0.0–5.0)
HCT: 31.7 % — ABNORMAL LOW (ref 36.0–46.0)
HEMOGLOBIN: 10.7 g/dL — AB (ref 12.0–15.0)
Lymphocytes Relative: 19.9 % (ref 12.0–46.0)
Lymphs Abs: 2.1 10*3/uL (ref 0.7–4.0)
MCHC: 33.7 g/dL (ref 30.0–36.0)
MCV: 83.3 fl (ref 78.0–100.0)
MONO ABS: 1.1 10*3/uL — AB (ref 0.1–1.0)
Monocytes Relative: 10.7 % (ref 3.0–12.0)
Neutro Abs: 6.8 10*3/uL (ref 1.4–7.7)
Neutrophils Relative %: 64.9 % (ref 43.0–77.0)
Platelets: 244 10*3/uL (ref 150.0–400.0)
RBC: 3.8 Mil/uL — AB (ref 3.87–5.11)
RDW: 14.2 % (ref 11.5–15.5)
WBC: 10.4 10*3/uL (ref 4.0–10.5)

## 2016-09-04 LAB — COMPREHENSIVE METABOLIC PANEL
ALBUMIN: 3.8 g/dL (ref 3.5–5.2)
ALK PHOS: 70 U/L (ref 39–117)
ALT: 9 U/L (ref 0–35)
AST: 12 U/L (ref 0–37)
BILIRUBIN TOTAL: 0.3 mg/dL (ref 0.2–1.2)
BUN: 28 mg/dL — AB (ref 6–23)
CO2: 33 mEq/L — ABNORMAL HIGH (ref 19–32)
Calcium: 9.8 mg/dL (ref 8.4–10.5)
Chloride: 101 mEq/L (ref 96–112)
Creatinine, Ser: 1.33 mg/dL — ABNORMAL HIGH (ref 0.40–1.20)
GFR: 41.21 mL/min — ABNORMAL LOW (ref >60.00)
Glucose, Bld: 115 mg/dL — ABNORMAL HIGH (ref 70–99)
Potassium: 3.9 mEq/L (ref 3.5–5.1)
SODIUM: 138 meq/L (ref 135–145)
TOTAL PROTEIN: 7.7 g/dL (ref 6.0–8.3)

## 2016-09-04 LAB — LIPID PANEL
CHOL/HDL RATIO: 4
Cholesterol: 175 mg/dL (ref 0–200)
HDL: 46.1 mg/dL (ref >39.00)
NONHDL: 129.35
Triglycerides: 215 mg/dL — ABNORMAL HIGH (ref 0.0–149.0)
VLDL: 43 mg/dL — AB (ref 0.0–40.0)

## 2016-09-04 LAB — HEMOGLOBIN A1C: Hgb A1c MFr Bld: 5.8 % (ref 4.6–6.5)

## 2016-09-04 LAB — LDL CHOLESTEROL, DIRECT: Direct LDL: 107 mg/dL

## 2016-09-04 LAB — FERRITIN: FERRITIN: 38.9 ng/mL (ref 10.0–291.0)

## 2016-09-04 LAB — IBC PANEL
IRON: 44 ug/dL (ref 42–145)
Saturation Ratios: 13.3 % — ABNORMAL LOW (ref 20.0–50.0)
TRANSFERRIN: 236 mg/dL (ref 212.0–360.0)

## 2016-09-04 LAB — FOLATE: Folate: 8.9 ng/mL (ref >5.9)

## 2016-09-04 LAB — VITAMIN B12: Vitamin B-12: >1500 pg/mL — ABNORMAL HIGH (ref 211–911)

## 2016-09-04 MED ORDER — CEFDINIR 300 MG PO CAPS: 300.0000 mg | ORAL_CAPSULE | Freq: Two times a day (BID) | ORAL | 0 refills | Status: AC

## 2016-09-04 NOTE — Progress Notes (Signed)
Subjective:  Patient ID: [name], female    DOB: January 09, 1941  Age: 76 y.o. MRN: [id number]  CC: Sore Throat   HPI [name] presents for a 3 day history of laryngitis, sore throat, low-grade fever and chills, cough productive of yellow phlegm, left-sided earache, and painful lymph nodes on the left side of her neck.  Outpatient Medications Prior to Visit  Medication Sig Dispense Refill  . acetaminophen (TYLENOL) 325 MG tablet Take 2 tablets (650 mg total) by mouth every 6 (six) hours as needed for mild pain or headache (or Fever >/= 101).    Marland Kitchen albuterol (PROAIR HFA) 108 (90 BASE) MCG/ACT inhaler Inhale 1-2 puffs into the lungs every 6 (six) hours as needed for wheezing or shortness of breath. (Patient taking differently: Inhale 1-2 puffs into the lungs every 6 (six) hours as needed for wheezing or shortness of breath. Patient take once a day instead of three times a day.) 18 g 11  . albuterol (PROVENTIL) (2.5 MG/3ML) 0.083% nebulizer solution Take 3 mLs (2.5 mg total) by nebulization every 6 (six) hours as needed for wheezing or shortness of breath. (Patient taking differently: Take 2.5 mg by nebulization every 6 (six) hours as needed for wheezing or shortness of breath. Patient only takes once a day instead of three times a day) 150 mL 11  . allopurinol (ZYLOPRIM) 100 MG tablet TAKE 1 TABLET BY MOUTH EVERY DAY FOR GOUT 90 tablet 3  . aspirin 81 MG EC tablet Take 81 mg by mouth daily.      Marland Kitchen BREO ELLIPTA 100-25 MCG/INH AEPB INHALE 1 PUFF INTO THE LUNGS DAILY 60 each 11  . cyanocobalamin 2000 MCG tablet Take 1 tablet (2,000 mcg total) by mouth daily. (Patient taking differently: Take 2,000 mcg by mouth every evening. ) 90 tablet 3  . Dietary Management Product (VASCULERA) TABS Take 1 tablet by mouth daily. 30 tablet 11  . ferrous sulfate 325 (65 FE) MG tablet Take 1 tablet (325 mg total) by mouth 2 (two) times daily with a meal. 60 tablet 11  . furosemide (LASIX) 40 MG tablet TAKE  1 TABLET BY MOUTH TWICE DAILY 180 tablet 1  . losartan (COZAAR) 100 MG tablet TAKE 1 TABLET BY MOUTH EVERY DAY. 90 tablet 3  . metoprolol (LOPRESSOR) 50 MG tablet TAKE 1 TABLET BY MOUTH TWICE DAILY 180 tablet 1  . nystatin (MYCOSTATIN/NYSTOP) powder Apply 1 Bottle topically 2 (two) times daily as needed (skin irritation.). Use as directed twice per day as needed 1 Bottle 0  . triamcinolone cream (KENALOG) 0.5 % Apply 1 application topically 3 (three) times daily. 100 g 1  . Vitamin D, Ergocalciferol, (DRISDOL) 50000 units CAPS capsule TAKE ONE CAPSULE BY MOUTH ONCE WEEKLY 12 capsule 3   No facility-administered medications prior to visit.     ROS Review of Systems  Constitutional: Positive for chills, fatigue and fever. Negative for appetite change and diaphoresis.  HENT: Positive for sore throat and voice change. Negative for facial swelling, sinus pain, sinus pressure and trouble swallowing.   Eyes: Negative.   Respiratory: Positive for cough. Negative for chest tightness, shortness of breath and wheezing.   Cardiovascular: Negative for chest pain, palpitations and leg swelling.  Gastrointestinal: Negative for abdominal pain, constipation, diarrhea, nausea and vomiting.  Endocrine: Negative.   Genitourinary: Negative.  Negative for difficulty urinating.  Musculoskeletal: Negative.  Negative for back pain and neck pain.  Skin: Negative for color change and rash.  Allergic/Immunologic: Negative.  Neurological: Negative.   Hematological: Negative.  Negative for adenopathy. Does not bruise/bleed easily.  Psychiatric/Behavioral: Negative.     Objective:  BP (!) 128/58 (BP Location: Left Arm, Patient Position: Sitting, Cuff Size: Large)   Pulse 69   Temp 98 F (36.7 C) (Oral)   Ht 5' (1.524 m)   Wt 233 lb (105.7 kg)   SpO2 97%   BMI 45.50 kg/m   BP Readings from Last 3 Encounters:  09/04/16 (!) 128/58  04/18/16 (!) 163/59  03/29/16 (!) 138/56    Wt Readings from Last 3  Encounters:  09/04/16 233 lb (105.7 kg)  04/18/16 235 lb 3.2 oz (106.7 kg)  03/29/16 234 lb 8 oz (106.4 kg)    Physical Exam  Constitutional: She is oriented to person, place, and time.  Non-toxic appearance. She does not have a sickly appearance. She does not appear ill. No distress.  HENT:  Right Ear: Hearing, tympanic membrane, external ear and ear canal normal.  Left Ear: Hearing, tympanic membrane, external ear and ear canal normal.  Nose: No mucosal edema or rhinorrhea. Right sinus exhibits no maxillary sinus tenderness. Left sinus exhibits no maxillary sinus tenderness.  Mouth/Throat: Mucous membranes are normal. Mucous membranes are not pale, not dry and not cyanotic. No oral lesions. No trismus in the jaw. No uvula swelling. Posterior oropharyngeal erythema present. No oropharyngeal exudate, posterior oropharyngeal edema or tonsillar abscesses.  Eyes: Conjunctivae are normal. Right eye exhibits no discharge. Left eye exhibits no discharge. No scleral icterus.  Neck: Normal range of motion. Neck supple.  Cardiovascular: Normal rate and regular rhythm.  Exam reveals no gallop.   Murmur heard.  Systolic murmur is present with a grade of 1/6   No diastolic murmur is present  Pulmonary/Chest: Effort normal and breath sounds normal. No respiratory distress. She has no wheezes. She has no rales. She exhibits no tenderness.  Abdominal: Soft. Bowel sounds are normal. She exhibits no distension and no mass. There is no tenderness. There is no rebound and no guarding.  Musculoskeletal: Normal range of motion. She exhibits no edema, tenderness or deformity.  Lymphadenopathy:    She has no cervical adenopathy.  Neurological: She is oriented to person, place, and time.  Skin: Skin is warm and dry. No rash noted. She is not diaphoretic. No erythema. No pallor.  Vitals reviewed.   Lab Results  Component Value Date   WBC 10.4 09/04/2016   HGB 10.7 (L) 09/04/2016   HCT 31.7 (L) 09/04/2016    PLT 244.0 09/04/2016   GLUCOSE 115 (H) 09/04/2016   CHOL 175 09/04/2016   TRIG 215.0 (H) 09/04/2016   HDL 46.10 09/04/2016   LDLDIRECT 107.0 09/04/2016   LDLCALC 93 08/17/2015   ALT 9 09/04/2016   AST 12 09/04/2016   NA 138 09/04/2016   K 3.9 09/04/2016   CL 101 09/04/2016   CREATININE 1.33 (H) 09/04/2016   BUN 28 (H) 09/04/2016   CO2 33 (H) 09/04/2016   TSH 2.466 03/20/2016   HGBA1C 5.8 09/04/2016   MICROALBUR 15.9 (H) 02/05/2013    Ct Abdomen Pelvis W Contrast  Result Date: 03/19/2016 CLINICAL DATA:  Lower abdominal pain, history of kidney stones and colitis EXAM: CT ABDOMEN AND PELVIS WITH CONTRAST TECHNIQUE: Multidetector CT imaging of the abdomen and pelvis was performed using the standard protocol following bolus administration of intravenous contrast. CONTRAST:  1m ISOVUE-300 IOPAMIDOL (ISOVUE-300) INJECTION 61% COMPARISON:  None. FINDINGS: Lower chest: Lung bases are notable for a right posterior Bochdalek's hernia.  Hepatobiliary: Liver is within normal limits. Gallbladder is unremarkable. No intrahepatic or extrahepatic ductal dilatation. Pancreas: Within normal limits. Spleen: Within normal limits. Adrenals/Urinary Tract: Adrenal glands are within normal limits. 15 mm cyst with layering hemorrhage posteriorly along the posterior left upper kidney (series 5/ image 7). Additional simple bilateral renal cysts measuring up to 2.4 cm along the anterior right lower kidney (series 2/ image 37). No hydronephrosis. Bladder is within normal limits. Stomach/Bowel: Stomach is within normal limits. No evidence of bowel obstruction. Normal appendix (series 2/image 48). Moderate periumbilical hernia containing fat, a nondilated loop of transverse colon (series 2/ image 53), and the distal appendix (series 2/image 52). Sigmoid diverticulosis, without evidence of diverticulitis. No colonic wall thickening or inflammatory changes. Vascular/Lymphatic: Atherosclerotic calcifications of the abdominal  aorta and branch vessels. No evidence of abdominal aortic aneurysm. No suspicious abdominopelvic lymphadenopathy. Reproductive: Uterus is within normal limits. Bilateral ovaries are unremarkable. Other: No abdominopelvic ascites. Musculoskeletal: Degenerative changes of the visualized thoracolumbar spine. Mild superior endplate Schmorl's node deformities with loss of height involving the mid vertebral bodies at L3 and L4 (sagittal image 124). IMPRESSION: No evidence of bowel obstruction.  Normal appendix. Sigmoid diverticulosis, without associated inflammatory changes. Moderate periumbilical hernia containing fat, nondilated transverse colon, and the distal appendix. Additional ancillary findings as above. Electronically Signed   By: Julian Hy M.D.   On: 03/19/2016 18:32    Assessment & Plan:   Basia was seen today for sore throat.  Diagnoses and all orders for this visit:  Acute tonsillitis due to other specified organisms- Will treat empirically for bacterial causes with cefdinir. -     cefdinir (OMNICEF) 300 MG capsule; Take 1 capsule (300 mg total) by mouth 2 (two) times daily.  Essential hypertension- her blood pressure is well-controlled, electrolytes and renal function are stable -     Comprehensive metabolic panel; Future  Other iron deficiency anemia- her anemia has not improved but her iron level is normal, there may be some component of anemia chronic disease. I will continue to monitor her hemoglobin and hematocrit. -     CBC with Differential/Platelet; Future -     IBC panel; Future -     Ferritin; Future  Other vitamin B12 deficiency anemia- as above -     CBC with Differential/Platelet; Future -     Folate; Future -     Vitamin B12; Future  Prediabetes- improvement noted, she will continue to work on her lifestyle modifications. -     Comprehensive metabolic panel; Future -     Hemoglobin A1c; Future  Hyperlipidemia with target LDL less than 100- she is not quite  achieved her LDL goal but she is also not willing to take a statin. -     Lipid panel; Future   I am having Ms. Hodapp start on cefdinir. I am also having her maintain her aspirin, albuterol, acetaminophen, albuterol, VASCULERA, cyanocobalamin, ferrous sulfate, triamcinolone cream, nystatin, losartan, furosemide, allopurinol, BREO ELLIPTA, metoprolol, and Vitamin D (Ergocalciferol).  Meds ordered this encounter  Medications  . cefdinir (OMNICEF) 300 MG capsule    Sig: Take 1 capsule (300 mg total) by mouth 2 (two) times daily.    Dispense:  20 capsule    Refill:  0     Follow-up: Return in about 3 weeks (around 09/25/2016).  [name], MD

## 2016-09-04 NOTE — Progress Notes (Signed)
Pre visit review using our clinic review tool, if applicable. No additional management support is needed unless otherwise documented below in the visit note. 

## 2016-09-04 NOTE — Patient Instructions (Signed)
Sore Throat A sore throat is pain, burning, irritation, or scratchiness in the throat. When you have a sore throat, you may feel pain or tenderness in your throat when you swallow or talk. Many things can cause a sore throat, including:  An infection.  Seasonal allergies.  Dryness in the air.  Irritants, such as smoke or pollution.  Gastroesophageal reflux disease (GERD).  A tumor. A sore throat is often the first sign of another sickness. It may happen with other symptoms, such as coughing, sneezing, fever, and swollen neck glands. Most sore throats go away without medical treatment. Follow these instructions at home:  Take over-the-counter medicines only as told by your health care provider.  Drink enough fluids to keep your urine clear or pale yellow.  Rest as needed.  To help with pain, try:  Sipping warm liquids, such as broth, herbal tea, or warm water.  Eating or drinking cold or frozen liquids, such as frozen ice pops.  Gargling with a salt-water mixture 3-4 times a day or as needed. To make a salt-water mixture, completely dissolve -1 tsp of salt in 1 cup of warm water.  Sucking on hard candy or throat lozenges.  Putting a cool-mist humidifier in your bedroom at night to moisten the air.  Sitting in the bathroom with the door closed for 5-10 minutes while you run hot water in the shower.  Do not use any tobacco products, such as cigarettes, chewing tobacco, and e-cigarettes. If you need help quitting, ask your health care provider. Contact a health care provider if:  You have a fever for more than 2-3 days.  You have symptoms that last (are persistent) for more than 2-3 days.  Your throat does not get better within 7 days.  You have a fever and your symptoms suddenly get worse. Get help right away if:  You have difficulty breathing.  You cannot swallow fluids, soft foods, or your saliva.  You have increased swelling in your throat or neck.  You have  persistent nausea and vomiting. This information is not intended to replace advice given to you by your health care provider. Make sure you discuss any questions you have with your health care provider. Document Released: 08/23/2004 Document Revised: 03/11/2016 Document Reviewed: 05/06/2015 Elsevier Interactive Patient Education  2017 [employer].

## 2016-09-05 ENCOUNTER — Encounter: Payer: Self-pay | Admitting: Internal Medicine

## 2016-09-06 ENCOUNTER — Encounter: Payer: Self-pay | Admitting: Internal Medicine

## 2016-09-24 DIAGNOSIS — J449 Chronic obstructive pulmonary disease, unspecified: Secondary | ICD-10-CM | POA: Diagnosis not present

## 2016-10-17 ENCOUNTER — Other Ambulatory Visit: Payer: Self-pay | Admitting: Internal Medicine

## 2016-10-22 DIAGNOSIS — J449 Chronic obstructive pulmonary disease, unspecified: Secondary | ICD-10-CM | POA: Diagnosis not present

## 2016-10-30 ENCOUNTER — Encounter: Payer: Self-pay | Admitting: Internal Medicine

## 2016-10-30 ENCOUNTER — Ambulatory Visit (INDEPENDENT_AMBULATORY_CARE_PROVIDER_SITE_OTHER)
Admission: RE | Admit: 2016-10-30 | Discharge: 2016-10-30 | Disposition: A | Discharge status: Home / Self Care | Payer: Medicare Other | Source: Ambulatory Visit | Attending: Internal Medicine | Admitting: Internal Medicine

## 2016-10-30 ENCOUNTER — Ambulatory Visit (INDEPENDENT_AMBULATORY_CARE_PROVIDER_SITE_OTHER): Payer: Medicare Other | Admitting: Internal Medicine

## 2016-10-30 VITALS — BP 170/64 | HR 81 | Temp 98.1°F | Resp 20 | Ht 60.0 in | Wt 229.0 lb

## 2016-10-30 DIAGNOSIS — J42 Unspecified chronic bronchitis: Secondary | ICD-10-CM | POA: Diagnosis not present

## 2016-10-30 DIAGNOSIS — J441 Chronic obstructive pulmonary disease with (acute) exacerbation: Secondary | ICD-10-CM

## 2016-10-30 DIAGNOSIS — J988 Other specified respiratory disorders: Secondary | ICD-10-CM | POA: Diagnosis not present

## 2016-10-30 DIAGNOSIS — R059 Cough, unspecified: Secondary | ICD-10-CM

## 2016-10-30 DIAGNOSIS — R05 Cough: Secondary | ICD-10-CM | POA: Diagnosis not present

## 2016-10-30 LAB — POCT EXHALED NITRIC OXIDE: FeNO level (ppb): 9

## 2016-10-30 MED ORDER — CEFDINIR 300 MG PO CAPS: 300.0000 mg | ORAL_CAPSULE | Freq: Two times a day (BID) | ORAL | 0 refills | Status: DC

## 2016-10-30 MED ORDER — ALBUTEROL SULFATE (2.5 MG/3ML) 0.083% IN NEBU: 2.5000 mg | INHALATION_SOLUTION | Freq: Four times a day (QID) | RESPIRATORY_TRACT | 11 refills | Status: DC | PRN

## 2016-10-30 MED ORDER — PROMETHAZINE-DM 6.25-15 MG/5ML PO SYRP: 5.0000 mL | ORAL_SOLUTION | Freq: Four times a day (QID) | ORAL | 0 refills | Status: DC | PRN

## 2016-10-30 MED ORDER — ALBUTEROL SULFATE HFA 108 (90 BASE) MCG/ACT IN AERS: 1.0000 | INHALATION_SPRAY | Freq: Four times a day (QID) | RESPIRATORY_TRACT | 11 refills | Status: AC | PRN

## 2016-10-30 NOTE — Progress Notes (Signed)
Pre visit review using our clinic review tool, if applicable. No additional management support is needed unless otherwise documented below in the visit note. 

## 2016-10-30 NOTE — Patient Instructions (Signed)
Cough, Adult Coughing is a reflex that clears your throat and your airways. Coughing helps to heal and protect your lungs. It is normal to cough occasionally, but a cough that happens with other symptoms or lasts a long time may be a sign of a condition that needs treatment. A cough may last only 2-3 weeks (acute), or it may last longer than 8 weeks (chronic). What are the causes? Coughing is commonly caused by:  Breathing in substances that irritate your lungs.  A viral or bacterial respiratory infection.  Allergies.  Asthma.  Postnasal drip.  Smoking.  Acid backing up from the stomach into the esophagus (gastroesophageal reflux).  Certain medicines.  Chronic lung problems, including COPD (or rarely, lung cancer).  Other medical conditions such as heart failure.  Follow these instructions at home: Pay attention to any changes in your symptoms. Take these actions to help with your discomfort:  Take medicines only as told by your health care provider. ? If you were prescribed an antibiotic medicine, take it as told by your health care provider. Do not stop taking the antibiotic even if you start to feel better. ? Talk with your health care provider before you take a cough suppressant medicine.  Drink enough fluid to keep your urine clear or pale yellow.  If the air is dry, use a cold steam vaporizer or humidifier in your bedroom or your home to help loosen secretions.  Avoid anything that causes you to cough at work or at home.  If your cough is worse at night, try sleeping in a semi-upright position.  Avoid cigarette smoke. If you smoke, quit smoking. If you need help quitting, ask your health care provider.  Avoid caffeine.  Avoid alcohol.  Rest as needed.  Contact a health care provider if:  You have new symptoms.  You cough up pus.  Your cough does not get better after 2-3 weeks, or your cough gets worse.  You cannot control your cough with suppressant  medicines and you are losing sleep.  You develop pain that is getting worse or pain that is not controlled with pain medicines.  You have a fever.  You have unexplained weight loss.  You have night sweats. Get help right away if:  You cough up blood.  You have difficulty breathing.  Your heartbeat is very fast. This information is not intended to replace advice given to you by your health care provider. Make sure you discuss any questions you have with your health care provider. Document Released: 01/12/2011 Document Revised: 12/22/2015 Document Reviewed: 09/22/2014 Elsevier Interactive Patient Education  2017 [employer]  

## 2016-10-30 NOTE — Progress Notes (Signed)
Subjective:  Patient ID: [name], female    DOB: 25-Jun-1941  Age: 76 y.o. MRN: [id number]  CC: Cough   HPI [name] presents for a one-week history of cough that's productive of yellow phlegm with scant intermittent streaks of blood with low-grade fever, chills, shortness of breath, night sweats. She denies chest pain. She has had some wheezing that improved with the use of an albuterol inhaler.  Outpatient Medications Prior to Visit  Medication Sig Dispense Refill  . acetaminophen (TYLENOL) 325 MG tablet Take 2 tablets (650 mg total) by mouth every 6 (six) hours as needed for mild pain or headache (or Fever >/= 101).    Marland Kitchen allopurinol (ZYLOPRIM) 100 MG tablet TAKE 1 TABLET BY MOUTH EVERY DAY FOR GOUT 90 tablet 3  . aspirin 81 MG EC tablet Take 81 mg by mouth daily.      Marland Kitchen BREO ELLIPTA 100-25 MCG/INH AEPB INHALE 1 PUFF INTO THE LUNGS DAILY 60 each 11  . cyanocobalamin 2000 MCG tablet Take 1 tablet (2,000 mcg total) by mouth daily. (Patient taking differently: Take 2,000 mcg by mouth every evening. ) 90 tablet 3  . Dietary Management Product (VASCULERA) TABS Take 1 tablet by mouth daily. 30 tablet 11  . ferrous sulfate 325 (65 FE) MG tablet Take 1 tablet (325 mg total) by mouth 2 (two) times daily with a meal. 60 tablet 11  . furosemide (LASIX) 40 MG tablet TAKE 1 TABLET BY MOUTH TWICE DAILY 180 tablet 0  . losartan (COZAAR) 100 MG tablet TAKE 1 TABLET BY MOUTH EVERY DAY. 90 tablet 3  . metoprolol (LOPRESSOR) 50 MG tablet TAKE 1 TABLET BY MOUTH TWICE DAILY 180 tablet 1  . nystatin (MYCOSTATIN/NYSTOP) powder Apply 1 Bottle topically 2 (two) times daily as needed (skin irritation.). Use as directed twice per day as needed 1 Bottle 0  . triamcinolone cream (KENALOG) 0.5 % Apply 1 application topically 3 (three) times daily. 100 g 1  . Vitamin D, Ergocalciferol, (DRISDOL) 50000 units CAPS capsule TAKE ONE CAPSULE BY MOUTH ONCE WEEKLY 12 capsule 3  . albuterol (PROAIR HFA) 108 (90  BASE) MCG/ACT inhaler Inhale 1-2 puffs into the lungs every 6 (six) hours as needed for wheezing or shortness of breath. (Patient taking differently: Inhale 1-2 puffs into the lungs every 6 (six) hours as needed for wheezing or shortness of breath. Patient take once a day instead of three times a day.) 18 g 11  . albuterol (PROVENTIL) (2.5 MG/3ML) 0.083% nebulizer solution Take 3 mLs (2.5 mg total) by nebulization every 6 (six) hours as needed for wheezing or shortness of breath. (Patient taking differently: Take 2.5 mg by nebulization every 6 (six) hours as needed for wheezing or shortness of breath. Patient only takes once a day instead of three times a day) 150 mL 11   No facility-administered medications prior to visit.     ROS Review of Systems  Constitutional: Positive for chills and fever. Negative for activity change, appetite change, diaphoresis, fatigue and unexpected weight change.  HENT: Negative for congestion, facial swelling, sinus pressure, sore throat and trouble swallowing.   Eyes: Negative for visual disturbance.  Respiratory: Positive for cough, choking, shortness of breath and wheezing. Negative for apnea, chest tightness and stridor.   Cardiovascular: Negative for chest pain, palpitations and leg swelling.  Gastrointestinal: Negative for abdominal pain, constipation, diarrhea, nausea and vomiting.  Endocrine: Negative.   Genitourinary: Negative.   Musculoskeletal: Negative.   Allergic/Immunologic: Negative.  Neurological: Negative.  Negative for dizziness, weakness and numbness.  Hematological: Negative.  Negative for adenopathy. Does not bruise/bleed easily.  Psychiatric/Behavioral: Negative.     Objective:  BP (!) 170/64 (BP Location: Right Arm, Patient Position: Sitting, Cuff Size: Large)   Pulse 81   Temp 98.1 F (36.7 C) (Oral)   Resp 20   Ht 5' (1.524 m)   Wt 229 lb (103.9 kg)   SpO2 96%   BMI 44.72 kg/m   BP Readings from Last 3 Encounters:  10/30/16  (!) 170/64  09/04/16 (!) 128/58  04/18/16 (!) 163/59    Wt Readings from Last 3 Encounters:  10/30/16 229 lb (103.9 kg)  09/04/16 233 lb (105.7 kg)  04/18/16 235 lb 3.2 oz (106.7 kg)    Physical Exam  Constitutional: She is oriented to person, place, and time.  Non-toxic appearance. She does not have a sickly appearance. She does not appear ill. No distress.  HENT:  Mouth/Throat: Oropharynx is clear and moist. No oropharyngeal exudate.  Eyes: Conjunctivae are normal. Right eye exhibits no discharge. Left eye exhibits no discharge. No scleral icterus.  Neck: Normal range of motion. Neck supple. No JVD present. No tracheal deviation present. No thyromegaly present.  Cardiovascular: Normal rate, regular rhythm, normal heart sounds and intact distal pulses.  Exam reveals no gallop and no friction rub.   No murmur heard. Pulmonary/Chest: Effort normal. No accessory muscle usage or stridor. No tachypnea. No respiratory distress. She has decreased breath sounds in the right upper field, the right middle field, the right lower field, the left upper field, the left middle field and the left lower field. She has no wheezes. She has no rhonchi. She has no rales. She exhibits no tenderness.  Abdominal: Soft. Bowel sounds are normal. She exhibits no distension and no mass. There is no tenderness. There is no rebound and no guarding.  Musculoskeletal: Normal range of motion. She exhibits no edema, tenderness or deformity.  Lymphadenopathy:    She has no cervical adenopathy.  Neurological: She is oriented to person, place, and time.  Skin: Skin is warm and dry. No rash noted. She is not diaphoretic. No erythema. No pallor.  Vitals reviewed.   Lab Results  Component Value Date   WBC 10.4 09/04/2016   HGB 10.7 (L) 09/04/2016   HCT 31.7 (L) 09/04/2016   PLT 244.0 09/04/2016   GLUCOSE 115 (H) 09/04/2016   CHOL 175 09/04/2016   TRIG 215.0 (H) 09/04/2016   HDL 46.10 09/04/2016   LDLDIRECT 107.0  09/04/2016   LDLCALC 93 08/17/2015   ALT 9 09/04/2016   AST 12 09/04/2016   NA 138 09/04/2016   K 3.9 09/04/2016   CL 101 09/04/2016   CREATININE 1.33 (H) 09/04/2016   BUN 28 (H) 09/04/2016   CO2 33 (H) 09/04/2016   TSH 2.466 03/20/2016   HGBA1C 5.8 09/04/2016   MICROALBUR 15.9 (H) 02/05/2013    Ct Abdomen Pelvis W Contrast  Result Date: 03/19/2016 CLINICAL DATA:  Lower abdominal pain, history of kidney stones and colitis EXAM: CT ABDOMEN AND PELVIS WITH CONTRAST TECHNIQUE: Multidetector CT imaging of the abdomen and pelvis was performed using the standard protocol following bolus administration of intravenous contrast. CONTRAST:  65m ISOVUE-300 IOPAMIDOL (ISOVUE-300) INJECTION 61% COMPARISON:  None. FINDINGS: Lower chest: Lung bases are notable for a right posterior Bochdalek's hernia. Hepatobiliary: Liver is within normal limits. Gallbladder is unremarkable. No intrahepatic or extrahepatic ductal dilatation. Pancreas: Within normal limits. Spleen: Within normal limits. Adrenals/Urinary Tract:  Adrenal glands are within normal limits. 15 mm cyst with layering hemorrhage posteriorly along the posterior left upper kidney (series 5/ image 7). Additional simple bilateral renal cysts measuring up to 2.4 cm along the anterior right lower kidney (series 2/ image 37). No hydronephrosis. Bladder is within normal limits. Stomach/Bowel: Stomach is within normal limits. No evidence of bowel obstruction. Normal appendix (series 2/image 48). Moderate periumbilical hernia containing fat, a nondilated loop of transverse colon (series 2/ image 53), and the distal appendix (series 2/image 52). Sigmoid diverticulosis, without evidence of diverticulitis. No colonic wall thickening or inflammatory changes. Vascular/Lymphatic: Atherosclerotic calcifications of the abdominal aorta and branch vessels. No evidence of abdominal aortic aneurysm. No suspicious abdominopelvic lymphadenopathy. Reproductive: Uterus is within  normal limits. Bilateral ovaries are unremarkable. Other: No abdominopelvic ascites. Musculoskeletal: Degenerative changes of the visualized thoracolumbar spine. Mild superior endplate Schmorl's node deformities with loss of height involving the mid vertebral bodies at L3 and L4 (sagittal image 124). IMPRESSION: No evidence of bowel obstruction.  Normal appendix. Sigmoid diverticulosis, without associated inflammatory changes. Moderate periumbilical hernia containing fat, nondilated transverse colon, and the distal appendix. Additional ancillary findings as above. Electronically Signed   By: Julian Hy M.D.   On: 03/19/2016 18:32    Assessment & Plan:   Nigel was seen today for cough.  Diagnoses and all orders for this visit:  Cough- Her exam is unremarkable, chest x-ray is within normal limits and she has a low FENO score so I don't think she would benefit from steroid therapy. -     DG Chest 2 View; Future -     POCT EXHALED NITRIC OXIDE  Chronic bronchitis, unspecified chronic bronchitis type (HCC) -     albuterol (PROVENTIL) (2.5 MG/3ML) 0.083% nebulizer solution; Take 3 mLs (2.5 mg total) by nebulization every 6 (six) hours as needed for wheezing or shortness of breath. -     albuterol (PROAIR HFA) 108 (90 Base) MCG/ACT inhaler; Inhale 1-2 puffs into the lungs every 6 (six) hours as needed for wheezing or shortness of breath.  COPD exacerbation (HCC) -     albuterol (PROVENTIL) (2.5 MG/3ML) 0.083% nebulizer solution; Take 3 mLs (2.5 mg total) by nebulization every 6 (six) hours as needed for wheezing or shortness of breath. -     albuterol (PROAIR HFA) 108 (90 Base) MCG/ACT inhaler; Inhale 1-2 puffs into the lungs every 6 (six) hours as needed for wheezing or shortness of breath.  RTI (respiratory tract infection) -     promethazine-dextromethorphan (PROMETHAZINE-DM) 6.25-15 MG/5ML syrup; Take 5 mLs by mouth 4 (four) times daily as needed for cough. -     cefdinir (OMNICEF) 300 MG  capsule; Take 1 capsule (300 mg total) by mouth 2 (two) times daily.   I am having Ms. Obrien start on promethazine-dextromethorphan and cefdinir. I am also having her maintain her aspirin, acetaminophen, VASCULERA, cyanocobalamin, ferrous sulfate, triamcinolone cream, nystatin, losartan, allopurinol, BREO ELLIPTA, metoprolol, Vitamin D (Ergocalciferol), furosemide, albuterol, and albuterol.  Meds ordered this encounter  Medications  . albuterol (PROVENTIL) (2.5 MG/3ML) 0.083% nebulizer solution    Sig: Take 3 mLs (2.5 mg total) by nebulization every 6 (six) hours as needed for wheezing or shortness of breath.    Dispense:  150 mL    Refill:  11  . albuterol (PROAIR HFA) 108 (90 Base) MCG/ACT inhaler    Sig: Inhale 1-2 puffs into the lungs every 6 (six) hours as needed for wheezing or shortness of breath.    Dispense:  18 g    Refill:  11  . promethazine-dextromethorphan (PROMETHAZINE-DM) 6.25-15 MG/5ML syrup    Sig: Take 5 mLs by mouth 4 (four) times daily as needed for cough.    Dispense:  118 mL    Refill:  0  . cefdinir (OMNICEF) 300 MG capsule    Sig: Take 1 capsule (300 mg total) by mouth 2 (two) times daily.    Dispense:  20 capsule    Refill:  0     Follow-up: Return in about 3 weeks (around 11/20/2016).  [name], MD

## 2016-11-08 ENCOUNTER — Ambulatory Visit (INDEPENDENT_AMBULATORY_CARE_PROVIDER_SITE_OTHER): Payer: Medicare Other | Admitting: Internal Medicine

## 2016-11-08 ENCOUNTER — Emergency Department (HOSPITAL_COMMUNITY): Payer: Medicare Other

## 2016-11-08 ENCOUNTER — Observation Stay (HOSPITAL_COMMUNITY)
Admission: EM | Admit: 2016-11-08 | Discharge: 2016-11-11 | Disposition: A | Discharge status: Home / Self Care | Payer: Medicare Other | Attending: Internal Medicine | Admitting: Internal Medicine

## 2016-11-08 ENCOUNTER — Encounter (HOSPITAL_COMMUNITY): Payer: Self-pay | Admitting: Emergency Medicine

## 2016-11-08 VITALS — BP 140/58 | HR 69 | Temp 97.6°F | Resp 16 | Ht 60.0 in | Wt 231.1 lb

## 2016-11-08 DIAGNOSIS — L03115 Cellulitis of right lower limb: Secondary | ICD-10-CM

## 2016-11-08 DIAGNOSIS — G4733 Obstructive sleep apnea (adult) (pediatric): Secondary | ICD-10-CM | POA: Diagnosis present

## 2016-11-08 DIAGNOSIS — N183 Chronic kidney disease, stage 3 (moderate): Secondary | ICD-10-CM | POA: Diagnosis not present

## 2016-11-08 DIAGNOSIS — I130 Hypertensive heart and chronic kidney disease with heart failure and stage 1 through stage 4 chronic kidney disease, or unspecified chronic kidney disease: Secondary | ICD-10-CM | POA: Insufficient documentation

## 2016-11-08 DIAGNOSIS — L97829 Non-pressure chronic ulcer of other part of left lower leg with unspecified severity: Secondary | ICD-10-CM | POA: Diagnosis not present

## 2016-11-08 DIAGNOSIS — D509 Iron deficiency anemia, unspecified: Secondary | ICD-10-CM | POA: Diagnosis present

## 2016-11-08 DIAGNOSIS — D472 Monoclonal gammopathy: Secondary | ICD-10-CM | POA: Diagnosis present

## 2016-11-08 DIAGNOSIS — I5033 Acute on chronic diastolic (congestive) heart failure: Secondary | ICD-10-CM | POA: Insufficient documentation

## 2016-11-08 DIAGNOSIS — Z888 Allergy status to other drugs, medicaments and biological substances status: Secondary | ICD-10-CM | POA: Diagnosis not present

## 2016-11-08 DIAGNOSIS — Z87891 Personal history of nicotine dependence: Secondary | ICD-10-CM | POA: Insufficient documentation

## 2016-11-08 DIAGNOSIS — I10 Essential (primary) hypertension: Secondary | ICD-10-CM | POA: Diagnosis not present

## 2016-11-08 DIAGNOSIS — N179 Acute kidney failure, unspecified: Secondary | ICD-10-CM | POA: Diagnosis not present

## 2016-11-08 DIAGNOSIS — D508 Other iron deficiency anemias: Secondary | ICD-10-CM

## 2016-11-08 DIAGNOSIS — I83028 Varicose veins of left lower extremity with ulcer other part of lower leg: Principal | ICD-10-CM | POA: Insufficient documentation

## 2016-11-08 DIAGNOSIS — L97819 Non-pressure chronic ulcer of other part of right lower leg with unspecified severity: Secondary | ICD-10-CM | POA: Diagnosis not present

## 2016-11-08 DIAGNOSIS — R600 Localized edema: Secondary | ICD-10-CM | POA: Diagnosis not present

## 2016-11-08 DIAGNOSIS — J449 Chronic obstructive pulmonary disease, unspecified: Secondary | ICD-10-CM | POA: Diagnosis not present

## 2016-11-08 DIAGNOSIS — Z7982 Long term (current) use of aspirin: Secondary | ICD-10-CM | POA: Diagnosis not present

## 2016-11-08 DIAGNOSIS — E559 Vitamin D deficiency, unspecified: Secondary | ICD-10-CM | POA: Diagnosis not present

## 2016-11-08 DIAGNOSIS — Z6841 Body Mass Index (BMI) 40.0 and over, adult: Secondary | ICD-10-CM | POA: Diagnosis not present

## 2016-11-08 DIAGNOSIS — Z881 Allergy status to other antibiotic agents status: Secondary | ICD-10-CM | POA: Diagnosis not present

## 2016-11-08 DIAGNOSIS — I83018 Varicose veins of right lower extremity with ulcer other part of lower leg: Secondary | ICD-10-CM | POA: Insufficient documentation

## 2016-11-08 DIAGNOSIS — L03116 Cellulitis of left lower limb: Secondary | ICD-10-CM | POA: Diagnosis not present

## 2016-11-08 DIAGNOSIS — I872 Venous insufficiency (chronic) (peripheral): Secondary | ICD-10-CM | POA: Diagnosis present

## 2016-11-08 DIAGNOSIS — Z79899 Other long term (current) drug therapy: Secondary | ICD-10-CM | POA: Diagnosis not present

## 2016-11-08 DIAGNOSIS — Z7951 Long term (current) use of inhaled steroids: Secondary | ICD-10-CM | POA: Diagnosis not present

## 2016-11-08 DIAGNOSIS — M109 Gout, unspecified: Secondary | ICD-10-CM | POA: Diagnosis not present

## 2016-11-08 DIAGNOSIS — Z885 Allergy status to narcotic agent status: Secondary | ICD-10-CM | POA: Diagnosis not present

## 2016-11-08 DIAGNOSIS — R609 Edema, unspecified: Secondary | ICD-10-CM

## 2016-11-08 DIAGNOSIS — K219 Gastro-esophageal reflux disease without esophagitis: Secondary | ICD-10-CM

## 2016-11-08 DIAGNOSIS — N1832 Chronic kidney disease, stage 3b: Secondary | ICD-10-CM | POA: Diagnosis present

## 2016-11-08 DIAGNOSIS — J42 Unspecified chronic bronchitis: Secondary | ICD-10-CM

## 2016-11-08 HISTORY — DX: Monoclonal gammopathy: D47.2

## 2016-11-08 LAB — CBC WITH DIFFERENTIAL/PLATELET
BASOS ABS: 0 10*3/uL (ref 0.0–0.1)
Basophils Relative: 0 %
EOS PCT: 7 %
Eosinophils Absolute: 0.6 10*3/uL (ref 0.0–0.7)
HCT: 27.6 % — ABNORMAL LOW (ref 36.0–46.0)
Hemoglobin: 8.7 g/dL — ABNORMAL LOW (ref 12.0–15.0)
LYMPHS PCT: 22 %
Lymphs Abs: 1.7 10*3/uL (ref 0.7–4.0)
MCH: 26.8 pg (ref 26.0–34.0)
MCHC: 31.5 g/dL (ref 30.0–36.0)
MCV: 84.9 fL (ref 78.0–100.0)
MONO ABS: 0.7 10*3/uL (ref 0.1–1.0)
Monocytes Relative: 9 %
Neutro Abs: 4.9 10*3/uL (ref 1.7–7.7)
Neutrophils Relative %: 62 %
PLATELETS: 260 10*3/uL (ref 150–400)
RBC: 3.25 MIL/uL — ABNORMAL LOW (ref 3.87–5.11)
RDW: 14.3 % (ref 11.5–15.5)
WBC: 8 10*3/uL (ref 4.0–10.5)

## 2016-11-08 LAB — BASIC METABOLIC PANEL
Anion gap: 7 (ref 5–15)
BUN: 25 mg/dL — AB (ref 6–20)
CO2: 29 mmol/L (ref 22–32)
Calcium: 8.7 mg/dL — ABNORMAL LOW (ref 8.9–10.3)
Chloride: 101 mmol/L (ref 101–111)
Creatinine, Ser: 1.71 mg/dL — ABNORMAL HIGH (ref 0.44–1.00)
GFR calc Af Amer: 32 mL/min — ABNORMAL LOW (ref >60)
GFR, EST NON AFRICAN AMERICAN: 28 mL/min — AB (ref >60)
GLUCOSE: 106 mg/dL — AB (ref 65–99)
POTASSIUM: 3.5 mmol/L (ref 3.5–5.1)
Sodium: 137 mmol/L (ref 135–145)

## 2016-11-08 LAB — I-STAT CG4 LACTIC ACID, ED: Lactic Acid, Venous: 0.75 mmol/L (ref 0.5–1.9)

## 2016-11-08 LAB — SEDIMENTATION RATE: SED RATE: 59 mm/h — AB (ref 0–22)

## 2016-11-08 LAB — I-STAT TROPONIN, ED: Troponin i, poc: 0 ng/mL (ref 0.00–0.08)

## 2016-11-08 LAB — BRAIN NATRIURETIC PEPTIDE: B Natriuretic Peptide: 117.3 pg/mL — ABNORMAL HIGH (ref 0.0–100.0)

## 2016-11-08 MED ORDER — FERROUS SULFATE 325 (65 FE) MG PO TABS
325.0000 mg | ORAL_TABLET | Freq: Two times a day (BID) | ORAL | Status: DC
  Administered 2016-11-09 – 2016-11-11 (×5): 325 mg via ORAL
  Filled 2016-11-08 (×5): qty 1

## 2016-11-08 MED ORDER — HYDROCODONE-ACETAMINOPHEN 5-325 MG PO TABS
1.0000 | ORAL_TABLET | Freq: Four times a day (QID) | ORAL | Status: DC | PRN
  Administered 2016-11-08 – 2016-11-10 (×6): 1 via ORAL
  Filled 2016-11-08 (×6): qty 1

## 2016-11-08 MED ORDER — ONDANSETRON HCL 4 MG/2ML IJ SOLN: 4.0000 mg | Freq: Four times a day (QID) | INTRAMUSCULAR | Status: DC | PRN

## 2016-11-08 MED ORDER — MORPHINE SULFATE (PF) 2 MG/ML IV SOLN
2.0000 mg | INTRAVENOUS | Status: DC | PRN
  Administered 2016-11-10 (×2): 2 mg via INTRAVENOUS
  Filled 2016-11-08 (×4): qty 1

## 2016-11-08 MED ORDER — SODIUM CHLORIDE 0.9% FLUSH
3.0000 mL | Freq: Two times a day (BID) | INTRAVENOUS | Status: DC
  Administered 2016-11-08 – 2016-11-11 (×5): 3 mL via INTRAVENOUS

## 2016-11-08 MED ORDER — SODIUM CHLORIDE 0.9% FLUSH: 3.0000 mL | INTRAVENOUS | Status: DC | PRN

## 2016-11-08 MED ORDER — ALBUTEROL SULFATE (2.5 MG/3ML) 0.083% IN NEBU: 2.5000 mg | INHALATION_SOLUTION | Freq: Four times a day (QID) | RESPIRATORY_TRACT | Status: DC | PRN

## 2016-11-08 MED ORDER — ONDANSETRON HCL 4 MG/2ML IJ SOLN
4.0000 mg | Freq: Four times a day (QID) | INTRAMUSCULAR | Status: DC | PRN
  Administered 2016-11-08 – 2016-11-10 (×6): 4 mg via INTRAVENOUS
  Filled 2016-11-08 (×5): qty 2

## 2016-11-08 MED ORDER — SODIUM CHLORIDE 0.9 % IV SOLN: 250.0000 mL | INTRAVENOUS | Status: DC | PRN

## 2016-11-08 MED ORDER — METOPROLOL TARTRATE 50 MG PO TABS
50.0000 mg | ORAL_TABLET | Freq: Two times a day (BID) | ORAL | Status: DC
  Administered 2016-11-08 – 2016-11-11 (×6): 50 mg via ORAL
  Filled 2016-11-08 (×6): qty 1

## 2016-11-08 MED ORDER — ACETAMINOPHEN 325 MG PO TABS: 650.0000 mg | ORAL_TABLET | ORAL | Status: DC | PRN

## 2016-11-08 MED ORDER — VITAMIN B-12 1000 MCG PO TABS
2000.0000 ug | ORAL_TABLET | Freq: Every evening | ORAL | Status: DC
Start: 2016-11-09 — End: 2016-11-11
  Administered 2016-11-09 – 2016-11-10 (×2): 2000 ug via ORAL
  Filled 2016-11-08: qty 2

## 2016-11-08 MED ORDER — FUROSEMIDE 10 MG/ML IJ SOLN
40.0000 mg | Freq: Two times a day (BID) | INTRAMUSCULAR | Status: DC
  Administered 2016-11-09 – 2016-11-10 (×3): 40 mg via INTRAVENOUS
  Filled 2016-11-08 (×3): qty 4

## 2016-11-08 MED ORDER — HEPARIN SODIUM (PORCINE) 5000 UNIT/ML IJ SOLN
5000.0000 [IU] | Freq: Three times a day (TID) | INTRAMUSCULAR | Status: DC
  Administered 2016-11-08 – 2016-11-11 (×9): 5000 [IU] via SUBCUTANEOUS
  Filled 2016-11-08 (×9): qty 1

## 2016-11-08 MED ORDER — ASPIRIN EC 81 MG PO TBEC
81.0000 mg | DELAYED_RELEASE_TABLET | Freq: Every day | ORAL | Status: DC
  Administered 2016-11-09 – 2016-11-11 (×3): 81 mg via ORAL
  Filled 2016-11-08 (×3): qty 1

## 2016-11-08 MED ORDER — FLUTICASONE FUROATE-VILANTEROL 100-25 MCG/INH IN AEPB
1.0000 | INHALATION_SPRAY | Freq: Every day | RESPIRATORY_TRACT | Status: DC
  Administered 2016-11-09 – 2016-11-11 (×3): 1 via RESPIRATORY_TRACT
  Filled 2016-11-08: qty 28

## 2016-11-08 MED ORDER — HYDRALAZINE HCL 20 MG/ML IJ SOLN: 10.0000 mg | INTRAMUSCULAR | Status: DC | PRN

## 2016-11-08 MED ORDER — ALLOPURINOL 100 MG PO TABS
100.0000 mg | ORAL_TABLET | ORAL | Status: DC
  Administered 2016-11-09 – 2016-11-11 (×2): 100 mg via ORAL
  Filled 2016-11-08 (×2): qty 1

## 2016-11-08 MED ORDER — FUROSEMIDE 10 MG/ML IJ SOLN
40.0000 mg | Freq: Once | INTRAMUSCULAR | Status: AC
  Administered 2016-11-08: 40 mg via INTRAVENOUS
  Filled 2016-11-08: qty 4

## 2016-11-08 NOTE — Patient Instructions (Signed)

## 2016-11-08 NOTE — Progress Notes (Signed)
Subjective:  Patient ID: [name], female    DOB: Apr 20, 1941  Age: 76 y.o. MRN: [id number]  CC: Recurrent Skin Infections   HPI ASHLY YEPEZ presents for A one-week history of low-grade fever, chills, worsening pain and swelling over her lower extremities, with lower extremity redness and weeping.  Outpatient Medications Prior to Visit  Medication Sig Dispense Refill  . acetaminophen (TYLENOL) 325 MG tablet Take 2 tablets (650 mg total) by mouth every 6 (six) hours as needed for mild pain or headache (or Fever >/= 101).    Marland Kitchen albuterol (PROAIR HFA) 108 (90 Base) MCG/ACT inhaler Inhale 1-2 puffs into the lungs every 6 (six) hours as needed for wheezing or shortness of breath. 18 g 11  . albuterol (PROVENTIL) (2.5 MG/3ML) 0.083% nebulizer solution Take 3 mLs (2.5 mg total) by nebulization every 6 (six) hours as needed for wheezing or shortness of breath. 150 mL 11  . allopurinol (ZYLOPRIM) 100 MG tablet TAKE 1 TABLET BY MOUTH EVERY DAY FOR GOUT 90 tablet 3  . aspirin 81 MG EC tablet Take 81 mg by mouth daily.      Marland Kitchen BREO ELLIPTA 100-25 MCG/INH AEPB INHALE 1 PUFF INTO THE LUNGS DAILY 60 each 11  . cefdinir (OMNICEF) 300 MG capsule Take 1 capsule (300 mg total) by mouth 2 (two) times daily. 20 capsule 0  . cyanocobalamin 2000 MCG tablet Take 1 tablet (2,000 mcg total) by mouth daily. (Patient taking differently: Take 2,000 mcg by mouth every evening. ) 90 tablet 3  . Dietary Management Product (VASCULERA) TABS Take 1 tablet by mouth daily. 30 tablet 11  . ferrous sulfate 325 (65 FE) MG tablet Take 1 tablet (325 mg total) by mouth 2 (two) times daily with a meal. 60 tablet 11  . furosemide (LASIX) 40 MG tablet TAKE 1 TABLET BY MOUTH TWICE DAILY 180 tablet 0  . losartan (COZAAR) 100 MG tablet TAKE 1 TABLET BY MOUTH EVERY DAY. 90 tablet 3  . metoprolol (LOPRESSOR) 50 MG tablet TAKE 1 TABLET BY MOUTH TWICE DAILY 180 tablet 1  . nystatin (MYCOSTATIN/NYSTOP) powder Apply 1 Bottle topically  2 (two) times daily as needed (skin irritation.). Use as directed twice per day as needed 1 Bottle 0  . promethazine-dextromethorphan (PROMETHAZINE-DM) 6.25-15 MG/5ML syrup Take 5 mLs by mouth 4 (four) times daily as needed for cough. 118 mL 0  . triamcinolone cream (KENALOG) 0.5 % Apply 1 application topically 3 (three) times daily. 100 g 1  . Vitamin D, Ergocalciferol, (DRISDOL) 50000 units CAPS capsule TAKE ONE CAPSULE BY MOUTH ONCE WEEKLY 12 capsule 3   No facility-administered medications prior to visit.     ROS Review of Systems  Constitutional: Positive for chills and fever. Negative for fatigue.  HENT: Negative.   Eyes: Negative.   Respiratory: Negative for cough, chest tightness, shortness of breath and wheezing.   Cardiovascular: Positive for leg swelling. Negative for chest pain and palpitations.  Gastrointestinal: Negative for abdominal pain, constipation, diarrhea, nausea and vomiting.  Endocrine: Negative.   Genitourinary: Negative.  Negative for difficulty urinating.  Skin: Positive for color change and wound. Negative for pallor and rash.  Allergic/Immunologic: Negative.   Neurological: Negative.  Negative for dizziness.  Hematological: Negative.  Negative for adenopathy. Does not bruise/bleed easily.  Psychiatric/Behavioral: Negative.     Objective:  BP (!) 140/58 (BP Location: Left Arm, Patient Position: Sitting, Cuff Size: Large)   Pulse 69   Temp 97.6 F (36.4 C) (Oral)  Resp 16   Ht 5' (1.524 m)   Wt 231 lb 1.3 oz (104.8 kg)   SpO2 96%   BMI 45.13 kg/m   BP Readings from Last 3 Encounters:  11/08/16 (!) 140/58  10/30/16 (!) 170/64  09/04/16 (!) 128/58    Wt Readings from Last 3 Encounters:  11/08/16 231 lb 1.3 oz (104.8 kg)  10/30/16 229 lb (103.9 kg)  09/04/16 233 lb (105.7 kg)    Physical Exam  Constitutional: She is oriented to person, place, and time.  Non-toxic appearance. She does not have a sickly appearance. She appears ill. No distress.   HENT:  Mouth/Throat: Oropharynx is clear and moist. No oropharyngeal exudate.  Eyes: Right eye exhibits no discharge. Left eye exhibits no discharge. No scleral icterus.  Neck: Normal range of motion. Neck supple. No JVD present. No tracheal deviation present. No thyromegaly present.  Cardiovascular: Normal rate and intact distal pulses.  Exam reveals no gallop and no friction rub.   Murmur heard.  Systolic murmur is present with a grade of 1/6   No diastolic murmur is present  Pulmonary/Chest: Effort normal and breath sounds normal. No stridor. No respiratory distress. She has no wheezes. She has no rales. She exhibits no tenderness.  Abdominal: Soft. Bowel sounds are normal. She exhibits no distension and no mass. There is no tenderness. There is no rebound and no guarding.  Musculoskeletal: She exhibits edema and tenderness. She exhibits no deformity.  Over both lower extremities there is confluent erythema with brawny induration and scaling on the edges, with areas of superficial ulcer formation and oozing a purulent exudate. The areas are both tender and warm. There is no induration or fluctuance.  Lymphadenopathy:    She has no cervical adenopathy.  Neurological: She is oriented to person, place, and time.  Skin: Skin is warm and dry. No rash noted. She is not diaphoretic. There is erythema.  Vitals reviewed.   Lab Results  Component Value Date   WBC 10.4 09/04/2016   HGB 10.7 (L) 09/04/2016   HCT 31.7 (L) 09/04/2016   PLT 244.0 09/04/2016   GLUCOSE 115 (H) 09/04/2016   CHOL 175 09/04/2016   TRIG 215.0 (H) 09/04/2016   HDL 46.10 09/04/2016   LDLDIRECT 107.0 09/04/2016   LDLCALC 93 08/17/2015   ALT 9 09/04/2016   AST 12 09/04/2016   NA 138 09/04/2016   K 3.9 09/04/2016   CL 101 09/04/2016   CREATININE 1.33 (H) 09/04/2016   BUN 28 (H) 09/04/2016   CO2 33 (H) 09/04/2016   TSH 2.466 03/20/2016   HGBA1C 5.8 09/04/2016   MICROALBUR 15.9 (H) 02/05/2013    Dg Chest 2  View  Result Date: 10/30/2016 CLINICAL DATA:  Cough, chest congestion, wheezing, and intermittent fever for the past 7 days. History of CHF, COPD, former smoker. EXAM: CHEST  2 VIEW COMPARISON:  PA and lateral chest x-ray of July 05, 2015 FINDINGS: The lungs are mildly hyperinflated. The interstitial markings are coarse. There is no alveolar infiltrate. There is no pleural effusion. The heart is top-normal in size. The pulmonary vascularity is normal. There is calcification in the wall of the ascending and descending thoracic aorta. The bony thorax exhibits no acute abnormality. IMPRESSION: COPD.  There is no pneumonia nor CHF. Thoracic aortic atherosclerosis. Electronically Signed   By: David  Martinique M.D.   On: 10/30/2016 11:22    Assessment & Plan:   Jadee was seen today for recurrent skin infections.  Diagnoses and all orders  for this visit:  Cellulitis of both lower extremities- she has lower extremity edema with venous stasis changes and what appears to be cellulitis with early ulcer formation. I think she will need strict leg elevation, diuresis, and possibly IV antibiotics. I have sent her to the ED for urgent evaluation and treatment.   I am having Ms. Hou maintain her aspirin, acetaminophen, VASCULERA, cyanocobalamin, ferrous sulfate, triamcinolone cream, nystatin, losartan, allopurinol, BREO ELLIPTA, metoprolol, Vitamin D (Ergocalciferol), furosemide, albuterol, albuterol, promethazine-dextromethorphan, and cefdinir.  No orders of the defined types were placed in this encounter.    Follow-up: No Follow-up on file.  [name], MD

## 2016-11-08 NOTE — ED Provider Notes (Signed)
Emergency Department Provider Note   I have reviewed the triage vital signs and the nursing notes.   HISTORY  Chief Complaint Cellulitis and Leg Swelling   HPI [name] is a 76 y.o. female with PMH of CHF, COPD, HTN, HLD, and renal insufficiency presents to the emergency department for evaluation of gradually worsening bilateral lower extremity swelling, drainage, blistering, and warmth. She notes that her left leg had increased swelling and she applied some olive oil and then lotion. The next day she felt the redness was developing in that leg worse than the right. She developed blisters over both legs and then used a cream to break open the blisters but has not had constant weeping and drainage in both legs. She saw her primary care physician today who referred her to the emergency department for IV antibiotic treatment and concern for cellulitis. Patient has noticed some increased fluid retention not only in her legs but her abdomen. She has had some increasing difficulty breathing and notes occasional chills. No measured fever at home.    Past Medical History:  Diagnosis Date  . CHF (congestive heart failure) ([place]) 08/31/03  . Colonic polyp 02/16/2008   Tubular adenoma  . COPD (chronic obstructive pulmonary disease) ([place]) 01/27/98  . GERD (gastroesophageal reflux disease) 07/30/92  . Gout   . HTN (hypertension) 07/30/1988  . Hyperlipidemia 11/27/96  . Kidney stone 1960, 1972, 1991  . Renal insufficiency   . Ulcerative colitis     Patient Active Problem List   Diagnosis Date Noted  . AKI (acute kidney injury) ([place]) 11/08/2016  . MGUS (monoclonal gammopathy of unknown significance) 11/08/2016  . Acute on chronic diastolic CHF (congestive heart failure) ([place]) 03/19/2016  . Asteatotic eczema 02/25/2016  . Benign neoplasm of ascending colon   . COLD (chronic obstructive lung disease) ([place])   . Anemia, iron deficiency 11/16/2014  . Cor pulmonale ([place]) 11/16/2014  . OSA  (obstructive sleep apnea) 06/22/2013  . Chronic venous stasis dermatitis of both lower extremities 06/02/2013  . Monoclonal paraproteinemia 02/23/2013  . Vitamin D deficiency 02/06/2013  . Cellulitis of both lower extremities 02/05/2013  . Osteopenia, senile 02/05/2013  . Other screening mammogram 02/05/2013  . Ulcer of leg, chronic, right ([place]) 10/10/2011  . CKD (chronic kidney disease), stage III 06/20/2010  . Morbid obesity ([place]) 04/19/2010  . Gout 01/10/2009  . ULCERATIVE COLITIS-LEFT SIDE 03/19/2008  . Vitamin B12 deficiency anemia 11/13/2006  . Bilateral lower extremity edema 11/13/2006  . Prediabetes 11/13/2006  . Hyperlipidemia with target LDL less than 100 11/27/1996  . GERD 07/30/1992  . HTN (hypertension) 07/30/1988    Past Surgical History:  Procedure Laterality Date  . BRONCHOSCOPY    . COLONOSCOPY WITH PROPOFOL N/A 02/23/2015   Procedure: COLONOSCOPY WITH PROPOFOL;  Surgeon: Ladene Artist, MD;  Location: [facility];  Service: Endoscopy;  Laterality: N/A;  . ESOPHAGOGASTRODUODENOSCOPY (EGD) WITH PROPOFOL N/A 02/23/2015   Procedure: ESOPHAGOGASTRODUODENOSCOPY (EGD) WITH PROPOFOL;  Surgeon: Ladene Artist, MD;  Location: [facility];  Service: Endoscopy;  Laterality: N/A;  . NO PAST SURGERIES        Allergies Celebrex [celecoxib]; Enalapril; Lipitor [atorvastatin]; Amlodipine besylate; Amoxicillin; Codeine sulfate; and Hydrocodone-acetaminophen  Family History  Problem Relation Age of Onset  . Stroke Mother   . Diabetes Mother   . Kidney cancer Mother   . Stomach cancer Mother   . Heart disease Mother   . Stroke Father   . Heart disease Brother   . Heart disease  Brother   . Crohn's disease Brother   . Heart disease Sister     CATH,STENT  . Clotting disorder Sister   . Cervical cancer Sister   . Lung cancer Sister   . Heart attack Sister   . Diabetes Sister   . Colon cancer Neg Hx     Social History Social History  Substance Use Topics  .  Smoking status: Former Smoker    Quit date: 06/01/1996  . Smokeless tobacco: Never Used     Comment: Quit 1998  . Alcohol use No    Review of Systems  Constitutional: No fever/chills Eyes: No visual changes. ENT: No sore throat. Cardiovascular: Denies chest pain. Respiratory: Denies shortness of breath. Gastrointestinal: No abdominal pain.  No nausea, no vomiting.  No diarrhea.  No constipation. Genitourinary: Negative for dysuria. Musculoskeletal: Negative for back pain. Skin: Bilateral LE edema, redness, blistering, and erythema.  Neurological: Negative for headaches, focal weakness or numbness.  10-point ROS otherwise negative.  ____________________________________________   PHYSICAL EXAM:  VITAL SIGNS: ED Triage Vitals  Enc Vitals Group     BP 11/08/16 1548 (!) 163/62     Pulse Rate 11/08/16 1548 71     Resp 11/08/16 1548 18     Temp 11/08/16 1548 97.8 F (36.6 C)     Temp Source 11/08/16 1548 Oral     SpO2 11/08/16 1548 97 %     Weight 11/08/16 1548 231 lb (104.8 kg)     Height 11/08/16 1548 5' (1.524 m)     Pain Score 11/08/16 1600 6   Constitutional: Alert and oriented. Well appearing and in no acute distress. Eyes: Conjunctivae are normal. Head: Atraumatic. Nose: No congestion/rhinnorhea. Mouth/Throat: Mucous membranes are moist.  Oropharynx non-erythematous. Neck: No stridor.   Cardiovascular: Normal rate, regular rhythm. Good peripheral circulation. Grossly normal heart sounds.   Respiratory: Normal respiratory effort.  No retractions. Lungs CTAB. Gastrointestinal: Soft and nontender. No distention.  Musculoskeletal: Bilateral LE edema and tenderness to palpation. No gross deformities of extremities. Neurologic:  Normal speech and language. No gross focal neurologic deficits are appreciated.  Skin:  Skin is warm. B/L LE edema with blistering in the right leg. Increased warmth and drainage. No purulence or ulceration.  Psychiatric: Mood and affect are  normal. Speech and behavior are normal.  ____________________________________________   LABS (all labs ordered are listed, but only abnormal results are displayed)  Labs Reviewed  BASIC METABOLIC PANEL - Abnormal; Notable for the following:       Result Value   Glucose, Bld 106 (*)    BUN 25 (*)    Creatinine, Ser 1.71 (*)    Calcium 8.7 (*)    GFR calc non Af Amer 28 (*)    GFR calc Af Amer 32 (*)    All other components within normal limits  CBC WITH DIFFERENTIAL/PLATELET - Abnormal; Notable for the following:    RBC 3.25 (*)    Hemoglobin 8.7 (*)    HCT 27.6 (*)    All other components within normal limits  BRAIN NATRIURETIC PEPTIDE - Abnormal; Notable for the following:    B Natriuretic Peptide 117.3 (*)    All other components within normal limits  BASIC METABOLIC PANEL - Abnormal; Notable for the following:    BUN 23 (*)    Creatinine, Ser 1.57 (*)    Calcium 8.4 (*)    GFR calc non Af Amer 31 (*)    GFR calc Af Amer 36 (*)  All other components within normal limits  SEDIMENTATION RATE - Abnormal; Notable for the following:    Sed Rate 59 (*)    All other components within normal limits  C-REACTIVE PROTEIN - Abnormal; Notable for the following:    CRP 2.7 (*)    All other components within normal limits  CBC WITH DIFFERENTIAL/PLATELET - Abnormal; Notable for the following:    RBC 2.88 (*)    Hemoglobin 7.7 (*)    HCT 24.7 (*)    All other components within normal limits  CULTURE, BLOOD (ROUTINE X 2)  CULTURE, BLOOD (ROUTINE X 2)  I-STAT CG4 LACTIC ACID, ED  I-STAT TROPOININ, ED   ____________________________________________  RADIOLOGY  Dg Chest 2 View  Result Date: 11/08/2016 CLINICAL DATA:  Cellulitis of both lower extremities. History of CHF and COPD. Hypertension. EXAM: CHEST  2 VIEW COMPARISON:  10/30/2016 CXR, CT abdomen and pelvis from 03/19/2016 FINDINGS: Chronic mild hyperinflation of the lungs. No pneumonic consolidation or CHF. No pneumothorax.  Heart is top-normal in size with with soft tissue density along the right heart border consistent with an epicardial fat pad by prior CT. Aortic atherosclerosis at the arch without aneurysm. No pneumonic consolidation is seen. There is slight blunting of the left costophrenic angle likely secondary to epicardial fat as well. Chronic degenerative changes are seen along the dorsal spine. No acute nor suspicious osseous abnormality. IMPRESSION: No active mild hyperinflation of the lungs. No acute pulmonary disease. Electronically Signed   By: Ashley Royalty M.D.   On: 11/08/2016 18:39    ____________________________________________   PROCEDURES  Procedure(s) performed:   Procedures  None ____________________________________________   INITIAL IMPRESSION / ASSESSMENT AND PLAN / ED COURSE  Pertinent labs & imaging results that were available during my care of the patient were reviewed by me and considered in my medical decision making (see chart for details).  Patient resents to the emergency department for evaluation of bilateral LE edema, redness, warmth, and blistering. Exam is more concerning for edema related changes rather that B/L LE cellulitis. She has anterior skin breakdown which may be 2/2 to developing ulcer but none yet. Afebrile here. Plan for labs, CXR/EKG with SOB symptoms, and reassess.   Labs are remarkable for AKI. Feel that the patient requires diuresis rather than abx for leg rash/blistering but with AKI feel that this is risky to do as an outpatient. Plan for admission.   Discussed patient's case with hospitalist, Dr. Myna Hidalgo. Patient and family (if present) updated with plan. Care transferred to hospitalist service.  I reviewed all nursing notes, vitals, pertinent old records, EKGs, labs, imaging (as available).  ____________________________________________  FINAL CLINICAL IMPRESSION(S) / ED DIAGNOSES  Final diagnoses:  Peripheral edema  AKI (acute kidney injury) ([place])       MEDICATIONS GIVEN DURING THIS VISIT:  Medications  albuterol (PROVENTIL) (2.5 MG/3ML) 0.083% nebulizer solution 2.5 mg (not administered)  metoprolol tartrate (LOPRESSOR) tablet 50 mg (50 mg Oral Given 11/08/16 2211)  fluticasone furoate-vilanterol (BREO ELLIPTA) 100-25 MCG/INH 1 puff (1 puff Inhalation Given 11/09/16 0856)  allopurinol (ZYLOPRIM) tablet 100 mg (not administered)  vitamin B-12 (CYANOCOBALAMIN) tablet 2,000 mcg (not administered)  ferrous sulfate tablet 325 mg (325 mg Oral Given 11/09/16 0849)  aspirin EC tablet 81 mg (not administered)  sodium chloride flush (NS) 0.9 % injection 3 mL (3 mLs Intravenous Given 11/08/16 2200)  sodium chloride flush (NS) 0.9 % injection 3 mL (not administered)  0.9 %  sodium chloride infusion (not administered)  acetaminophen (  TYLENOL) tablet 650 mg (not administered)  ondansetron (ZOFRAN) injection 4 mg (4 mg Intravenous Given 11/09/16 0521)  heparin injection 5,000 Units (5,000 Units Subcutaneous Given 11/09/16 0518)  furosemide (LASIX) injection 40 mg (40 mg Intravenous Given 11/09/16 0517)  HYDROcodone-acetaminophen (NORCO/VICODIN) 5-325 MG per tablet 1 tablet (1 tablet Oral Given 11/09/16 0521)  morphine 2 MG/ML injection 2 mg (not administered)  hydrALAZINE (APRESOLINE) injection 10 mg (not administered)  cefTRIAXone (ROCEPHIN) 2 g in dextrose 5 % 50 mL IVPB (not administered)  furosemide (LASIX) injection 40 mg (40 mg Intravenous Given 11/08/16 2114)     NEW OUTPATIENT MEDICATIONS STARTED DURING THIS VISIT:  None   Note:  This document was prepared using Dragon voice recognition software and may include unintentional dictation errors.  Nanda Quinton, MD Emergency Medicine  Margette Fast, MD 11/09/16 [phone]

## 2016-11-08 NOTE — H&P (Signed)
History and Physical    [name]:[id number] DOB: [birth year] DOA: 11/08/2016  PCP: [name], MD   Patient coming from: Home, by way of PCP office  Chief Complaint: Bilateral LE swelling and pain   HPI: [name] is a 76 y.o. female with medical history significant for COPD, chronic diastolic CHF, hypertension, and chronic kidney disease stage III who presents at the direction of her PCP for evaluation of bilateral lower extremity pain, redness, and swelling. Patient reports that she been in her usual state of health until developing cough and dyspnea a couple weeks ago. She was treated with Ceftin here for this and reports improvement. Then over the past week, she has noted progressive swelling of the bilateral lower extremities with increased redness bilaterally and increasing tenderness bilaterally. Patient has chronic swelling and stasis dermatitis to the bilateral lower extremities, but reports increased redness and pain over the past week. There has also been some weeping of serous fluid from the bilateral distal lower extremities. There has not been any fevers or chills associated with this and she denies chest pain, palpitations, cough, recent prolonged immobilization, or personal or family history of VTE. She was evaluated for this by her PCP today and directed to the ED for further evaluation.  ED Course: Upon arrival to the ED, patient is found to be afebrile, saturating well on room air, and with vital signs stable. EKG features a normal sinus rhythm and chest x-ray is negative for acute cardiopulmonary disease. Chemistry panel reveals a BUN of 25 and serum creatinine 1.71, up from an apparent baseline of approximately 1.2. CBC is notable for a normocytic anemia with hemoglobin 8.7, consistent with recent priors in the 7-10 range. Lactic acid is reassuring at 0.75, troponin is undetectable, and BNP is mildly elevated 217. Blood cultures were obtained and the patient was  given a dose of 40 mg IV Lasix in the ED. She remained hemodynamically stable and in no acute restaurant distress and will be admitted to the telemetry unit for ongoing evaluation and management of bilateral lower extremity swelling and tenderness suspected secondary to acute on chronic diastolic CHF and worsening in her chronic venous stasis dermatitis.  Review of Systems:  All other systems reviewed and apart from HPI, are negative.  Past Medical History:  Diagnosis Date  . CHF (congestive heart failure) ([place]) 08/31/03  . Colonic polyp 02/16/2008   Tubular adenoma  . COPD (chronic obstructive pulmonary disease) ([place]) 01/27/98  . GERD (gastroesophageal reflux disease) 07/30/92  . Gout   . HTN (hypertension) 07/30/1988  . Hyperlipidemia 11/27/96  . Kidney stone 1960, 1972, 1991  . Renal insufficiency   . Ulcerative colitis     Past Surgical History:  Procedure Laterality Date  . BRONCHOSCOPY    . COLONOSCOPY WITH PROPOFOL N/A 02/23/2015   Procedure: COLONOSCOPY WITH PROPOFOL;  Surgeon: Ladene Artist, MD;  Location: [facility];  Service: Endoscopy;  Laterality: N/A;  . ESOPHAGOGASTRODUODENOSCOPY (EGD) WITH PROPOFOL N/A 02/23/2015   Procedure: ESOPHAGOGASTRODUODENOSCOPY (EGD) WITH PROPOFOL;  Surgeon: Ladene Artist, MD;  Location: [facility];  Service: Endoscopy;  Laterality: N/A;  . NO PAST SURGERIES       reports that she quit smoking about 20 years ago. She has never used smokeless tobacco. She reports that she does not drink alcohol or use drugs.  Allergies  Allergen Reactions  . Celebrex [Celecoxib]     edema  . Enalapril Cough  . Lipitor [Atorvastatin]     Muscle  aches  . Amlodipine Besylate     REACTION: edema  . Amoxicillin     REACTION: Diarrhea  . Codeine Sulfate     REACTION: Nausea  . Hydrocodone-Acetaminophen     REACTION: Nausea    Family History  Problem Relation Age of Onset  . Stroke Mother   . Diabetes Mother   . Kidney cancer Mother   . Stomach cancer  Mother   . Heart disease Mother   . Stroke Father   . Heart disease Brother   . Heart disease Brother   . Crohn's disease Brother   . Heart disease Sister     CATH,STENT  . Clotting disorder Sister   . Cervical cancer Sister   . Lung cancer Sister   . Heart attack Sister   . Diabetes Sister   . Colon cancer Neg Hx      Prior to Admission medications   Medication Sig Start Date End Date Taking? Authorizing Provider  acetaminophen (TYLENOL) 325 MG tablet Take 2 tablets (650 mg total) by mouth every 6 (six) hours as needed for mild pain or headache (or Fever >/= 101). 11/23/14  Yes Christina P Rama, MD  albuterol (PROAIR HFA) 108 (90 Base) MCG/ACT inhaler Inhale 1-2 puffs into the lungs every 6 (six) hours as needed for wheezing or shortness of breath. 10/30/16  Yes [name], MD  allopurinol (ZYLOPRIM) 100 MG tablet TAKE 1 TABLET BY MOUTH EVERY DAY FOR GOUT 05/17/16  Yes [name], MD  aspirin 81 MG EC tablet Take 81 mg by mouth daily.     Yes Historical Provider, MD  BREO ELLIPTA 100-25 MCG/INH AEPB INHALE 1 PUFF INTO THE LUNGS DAILY 06/05/16  Yes [name], MD  cefdinir (OMNICEF) 300 MG capsule Take 1 capsule (300 mg total) by mouth 2 (two) times daily. 10/30/16 11/09/16 Yes [name], MD  cyanocobalamin 2000 MCG tablet Take 1 tablet (2,000 mcg total) by mouth daily. Patient taking differently: Take 2,000 mcg by mouth every evening.  02/24/16  Yes [name], MD  Dietary Management Product (VASCULERA) TABS Take 1 tablet by mouth daily. 02/23/16  Yes [name], MD  ferrous sulfate 325 (65 FE) MG tablet Take 1 tablet (325 mg total) by mouth 2 (two) times daily with a meal. 02/24/16  Yes [name], MD  furosemide (LASIX) 40 MG tablet TAKE 1 TABLET BY MOUTH TWICE DAILY 10/17/16  Yes [name], MD  losartan (COZAAR) 100 MG tablet TAKE 1 TABLET BY MOUTH EVERY DAY. 04/01/16  Yes [name], MD  metoprolol (LOPRESSOR) 50 MG tablet TAKE 1 TABLET BY MOUTH TWICE  DAILY 07/19/16  Yes [name], MD  nystatin (MYCOSTATIN/NYSTOP) powder Apply 1 Bottle topically 2 (two) times daily as needed (skin irritation.). Use as directed twice per day as needed 03/22/16  Yes Geradine Girt, DO  promethazine-dextromethorphan (PROMETHAZINE-DM) 6.25-15 MG/5ML syrup Take 5 mLs by mouth 4 (four) times daily as needed for cough. 10/30/16  Yes [name], MD  triamcinolone cream (KENALOG) 0.5 % Apply 1 application topically 3 (three) times daily. 02/25/16  Yes [name], MD  Vitamin D, Ergocalciferol, (DRISDOL) 50000 units CAPS capsule TAKE ONE CAPSULE BY MOUTH ONCE WEEKLY 08/09/16  Yes [name], MD  albuterol (PROVENTIL) (2.5 MG/3ML) 0.083% nebulizer solution Take 3 mLs (2.5 mg total) by nebulization every 6 (six) hours as needed for wheezing or shortness of breath. 10/30/16   Arvid Right  Ronnald Ramp, MD    Physical Exam: Vitals:   11/08/16 1548 11/08/16 1812 11/08/16 1932  BP: (!) 163/62 (!) 169/60 (!) 158/70  Pulse: 71 72 71  Resp: 18 (!) 21 20  Temp: 97.8 F (36.6 C)  98.2 F (36.8 C)  TempSrc: Oral  Oral  SpO2: 97% 93% 93%  Weight: 104.8 kg (231 lb)    Height: 5' (1.524 m)        Constitutional: NAD. Obese, appears uncomfortable.  Eyes: PERTLA, lids and conjunctivae normal ENMT: Mucous membranes are moist. Posterior pharynx clear of any exudate or lesions.   Neck: normal, supple, no masses, no thyromegaly Respiratory: No wheezing, no crackles appreicated. Normal respiratory effort. No accessory muscle use.  Cardiovascular: S1 & S2 heard, regular rate and rhythm. Marked BLE edema. JVP could not be visualized. Abdomen: No distension, no tenderness, no masses palpated. Bowel sounds normal.  Musculoskeletal: no clubbing / cyanosis. No joint deformity upper and lower extremities.    Skin: Bilateral lower legs with erythema, marked edema, and chronic stasis ulcers with scant serous weeping. Skin otherwise warm, dry, well-perfused. Neurologic: CN 2-12 grossly  intact. Sensation intact, DTR normal. Strength 5/5 in all 4 limbs.  Psychiatric: Alert and oriented x 3. Normal mood and affect.     Labs on Admission: I have personally reviewed following labs and imaging studies  CBC:  Recent Labs Lab 11/08/16 1815  WBC 8.0  NEUTROABS 4.9  HGB 8.7*  HCT 27.6*  MCV 84.9  PLT 003   Basic Metabolic Panel:  Recent Labs Lab 11/08/16 1815  NA 137  K 3.5  CL 101  CO2 29  GLUCOSE 106*  BUN 25*  CREATININE 1.71*  CALCIUM 8.7*   GFR: Estimated Creatinine Clearance: 30.6 mL/min (A) (by C-G formula based on SCr of 1.71 mg/dL (H)). Liver Function Tests: No results for input(s): AST, ALT, ALKPHOS, BILITOT, PROT, ALBUMIN in the last 168 hours. No results for input(s): LIPASE, AMYLASE in the last 168 hours. No results for input(s): AMMONIA in the last 168 hours. Coagulation Profile: No results for input(s): INR, PROTIME in the last 168 hours. Cardiac Enzymes: No results for input(s): CKTOTAL, CKMB, CKMBINDEX, TROPONINI in the last 168 hours. BNP (last 3 results) No results for input(s): PROBNP in the last 8760 hours. HbA1C: No results for input(s): HGBA1C in the last 72 hours. CBG: No results for input(s): GLUCAP in the last 168 hours. Lipid Profile: No results for input(s): CHOL, HDL, LDLCALC, TRIG, CHOLHDL, LDLDIRECT in the last 72 hours. Thyroid Function Tests: No results for input(s): TSH, T4TOTAL, FREET4, T3FREE, THYROIDAB in the last 72 hours. Anemia Panel: No results for input(s): VITAMINB12, FOLATE, FERRITIN, TIBC, IRON, RETICCTPCT in the last 72 hours. Urine analysis:    Component Value Date/Time   COLORURINE YELLOW 04/22/2014 1420   APPEARANCEUR CLEAR 04/22/2014 1420   LABSPEC <=1.005 (A) 04/22/2014 1420   PHURINE 6.0 04/22/2014 1420   GLUCOSEU NEGATIVE 04/22/2014 1420   HGBUR NEGATIVE 04/22/2014 1420   HGBUR moderate 02/02/2008 1439   BILIRUBINUR NEGATIVE 04/22/2014 1420   KETONESUR NEGATIVE 04/22/2014 1420   PROTEINUR  100 (A) 06/01/2013 1722   UROBILINOGEN 0.2 04/22/2014 1420   NITRITE NEGATIVE 04/22/2014 1420   LEUKOCYTESUR NEGATIVE 04/22/2014 1420   Sepsis Labs: @LABRCNTIP (procalcitonin:4,lacticidven:4) )No results found for this or any previous visit (from the past 240 hour(s)).   Radiological Exams on Admission: Dg Chest 2 View  Result Date: 11/08/2016 CLINICAL DATA:  Cellulitis of both lower extremities. History of CHF and  COPD. Hypertension. EXAM: CHEST  2 VIEW COMPARISON:  10/30/2016 CXR, CT abdomen and pelvis from 03/19/2016 FINDINGS: Chronic mild hyperinflation of the lungs. No pneumonic consolidation or CHF. No pneumothorax. Heart is top-normal in size with with soft tissue density along the right heart border consistent with an epicardial fat pad by prior CT. Aortic atherosclerosis at the arch without aneurysm. No pneumonic consolidation is seen. There is slight blunting of the left costophrenic angle likely secondary to epicardial fat as well. Chronic degenerative changes are seen along the dorsal spine. No acute nor suspicious osseous abnormality. IMPRESSION: No active mild hyperinflation of the lungs. No acute pulmonary disease. Electronically Signed   By: Ashley Royalty M.D.   On: 11/08/2016 18:39    EKG: Independently reviewed. Sinus rhythm.   Assessment/Plan  1. Acute on chronic diastolic CHF  - Pt presents with progressive peripheral edema and appears hypervolemic  - TTE (10/30/14) with EF 62-03%, grade 1 diastolic dysfunction, and moderate LAE - She is managed at home with Lasix 40 mg BID, losartan, and metoprolol  - She was given 40 mg IV Lasix in ED  - Plan to monitor on telemetry, continue diuresis with 40 mg IV Lasix q12h, SLIV, fluid-restrict diet, and follow daily wts, strict I/O's, and qAM chem panel  - Plan to update echo   2. Chronic venous stasis dermatitis  - Pt with chronic venous stasis with ulcers and hx of superimposed cellulitis  - Noted to have some serous weeping  bilaterally - No fever or leukocytosis, lactate wnl, and there is no heat to suggestion acute infection; blood cultures were obtained in ED; check inflammatory markers  - Plan to diuresis as above, elevate legs, and request wound care consult    3. Acute kidney injury superimposed on CKD stage III  - SCr is 1.71 on admission, up from apparent baseline of 1.1-1.3  - Possibly secondary to acute CHF; diuresing  - Hold losartan, renally-dose medications, repeat chem panel qAM   4. Anemia   - Pt with recent anemia panel suggesting IDA - Is followed by oncology for MGUS  - No apparent bleeding; continue iron and B12 supplements    5. GERD - Duodenal AVM and small hiatal hernia noted on EGD in 2016  - Managed with daily PPI, will continue    6. COPD - Stable on admission  - Continue scheduled Breo and prn albuterol   7. Hypertension - BP at goal  - Losartan held in light of AKI  - Continue metoprolol as tolerated, treat with hydralazine IVP's prn     DVT prophylaxis: sq heparin  Code Status: Partial, no chest compressions or intubation Family Communication: Discussed with patient Disposition Plan: Observe on telemetry Consults called: None Admission status: Observation   Vianne Bulls, MD [facility] Pager [phone]  If 7PM-7AM, please contact night-coverage [email or web address] Password [facility]  11/08/2016, 8:28 PM

## 2016-11-08 NOTE — Progress Notes (Signed)
Pre visit review using our clinic review tool, if applicable. No additional management support is needed unless otherwise documented below in the visit note. 

## 2016-11-08 NOTE — ED Triage Notes (Addendum)
Pt c/o cellulitis in bilateral lower extremities for a week. She was seen at her PCP today who recommended she be seen for evaluation and IV antibiotics. Reports yellowish fluid leaking from legs. Denies Fevers, SOB, N/V. Uses cane to ambulate.

## 2016-11-08 NOTE — Progress Notes (Signed)
Pt has refused CPAP for the night.  Pt states that she doesn't use one at home, RT to monitor and assess as needed.

## 2016-11-09 ENCOUNTER — Observation Stay (HOSPITAL_BASED_OUTPATIENT_CLINIC_OR_DEPARTMENT_OTHER): Payer: Medicare Other

## 2016-11-09 DIAGNOSIS — D472 Monoclonal gammopathy: Secondary | ICD-10-CM | POA: Diagnosis not present

## 2016-11-09 DIAGNOSIS — I10 Essential (primary) hypertension: Secondary | ICD-10-CM | POA: Diagnosis not present

## 2016-11-09 DIAGNOSIS — L03115 Cellulitis of right lower limb: Secondary | ICD-10-CM | POA: Diagnosis not present

## 2016-11-09 DIAGNOSIS — N179 Acute kidney failure, unspecified: Secondary | ICD-10-CM | POA: Diagnosis not present

## 2016-11-09 DIAGNOSIS — R609 Edema, unspecified: Secondary | ICD-10-CM

## 2016-11-09 DIAGNOSIS — N183 Chronic kidney disease, stage 3 (moderate): Secondary | ICD-10-CM | POA: Diagnosis not present

## 2016-11-09 DIAGNOSIS — D508 Other iron deficiency anemias: Secondary | ICD-10-CM | POA: Diagnosis not present

## 2016-11-09 DIAGNOSIS — L03116 Cellulitis of left lower limb: Secondary | ICD-10-CM

## 2016-11-09 DIAGNOSIS — K219 Gastro-esophageal reflux disease without esophagitis: Secondary | ICD-10-CM | POA: Diagnosis not present

## 2016-11-09 DIAGNOSIS — I872 Venous insufficiency (chronic) (peripheral): Secondary | ICD-10-CM | POA: Diagnosis not present

## 2016-11-09 DIAGNOSIS — R600 Localized edema: Secondary | ICD-10-CM | POA: Diagnosis not present

## 2016-11-09 DIAGNOSIS — I5033 Acute on chronic diastolic (congestive) heart failure: Secondary | ICD-10-CM | POA: Diagnosis not present

## 2016-11-09 DIAGNOSIS — I509 Heart failure, unspecified: Secondary | ICD-10-CM | POA: Diagnosis not present

## 2016-11-09 LAB — CBC WITH DIFFERENTIAL/PLATELET
Basophils Absolute: 0 10*3/uL (ref 0.0–0.1)
Basophils Relative: 0 %
EOS ABS: 0.6 10*3/uL (ref 0.0–0.7)
Eosinophils Relative: 8 %
HCT: 24.7 % — ABNORMAL LOW (ref 36.0–46.0)
HEMOGLOBIN: 7.7 g/dL — AB (ref 12.0–15.0)
Lymphocytes Relative: 23 %
Lymphs Abs: 1.6 10*3/uL (ref 0.7–4.0)
MCH: 26.7 pg (ref 26.0–34.0)
MCHC: 31.2 g/dL (ref 30.0–36.0)
MCV: 85.8 fL (ref 78.0–100.0)
Monocytes Absolute: 0.9 10*3/uL (ref 0.1–1.0)
Monocytes Relative: 13 %
NEUTROS PCT: 56 %
Neutro Abs: 3.9 10*3/uL (ref 1.7–7.7)
Platelets: 252 10*3/uL (ref 150–400)
RBC: 2.88 MIL/uL — AB (ref 3.87–5.11)
RDW: 14.5 % (ref 11.5–15.5)
WBC: 7 10*3/uL (ref 4.0–10.5)

## 2016-11-09 LAB — BASIC METABOLIC PANEL
ANION GAP: 6 (ref 5–15)
BUN: 23 mg/dL — ABNORMAL HIGH (ref 6–20)
CO2: 30 mmol/L (ref 22–32)
Calcium: 8.4 mg/dL — ABNORMAL LOW (ref 8.9–10.3)
Chloride: 102 mmol/L (ref 101–111)
Creatinine, Ser: 1.57 mg/dL — ABNORMAL HIGH (ref 0.44–1.00)
GFR calc non Af Amer: 31 mL/min — ABNORMAL LOW (ref >60)
GFR, EST AFRICAN AMERICAN: 36 mL/min — AB (ref >60)
Glucose, Bld: 93 mg/dL (ref 65–99)
Potassium: 3.5 mmol/L (ref 3.5–5.1)
Sodium: 138 mmol/L (ref 135–145)

## 2016-11-09 LAB — ECHOCARDIOGRAM COMPLETE
HEIGHTINCHES: 60 in
Weight: 3679.04 oz

## 2016-11-09 LAB — C-REACTIVE PROTEIN: CRP: 2.7 mg/dL — ABNORMAL HIGH (ref <1.0)

## 2016-11-09 MED ORDER — DEXTROSE 5 % IV SOLN
2.0000 g | INTRAVENOUS | Status: DC
  Administered 2016-11-09 – 2016-11-11 (×3): 2 g via INTRAVENOUS
  Filled 2016-11-09 (×3): qty 2

## 2016-11-09 MED ORDER — HYDROCERIN EX CREA
TOPICAL_CREAM | CUTANEOUS | Status: DC
  Administered 2016-11-09: 18:00:00 via TOPICAL
  Filled 2016-11-09: qty 113

## 2016-11-09 NOTE — Plan of Care (Signed)
Problem: Skin Integrity: Goal: Risk for impaired skin integrity will decrease Outcome: Progressing Pt seen by woc today. New skin regimen will start

## 2016-11-09 NOTE — Care Management Note (Signed)
Case Management Note  Patient Details  Name: HALAINA VANDUZER MRN: [id number] Date of Birth: 1940/09/17  Subjective/Objective:  76 y/o f admitted w/CHF. From home.                  Action/Plan:d/c plan home.   Expected Discharge Date:                  Expected Discharge Plan:  Home/Self Care  In-House Referral:     Discharge planning Services  CM Consult  Post Acute Care Choice:    Choice offered to:     DME Arranged:    DME Agency:     HH Arranged:    HH Agency:     Status of Service:  In process, will continue to follow  If discussed at Long Length of Stay Meetings, dates discussed:    Additional Comments:  Dessa Phi, RN 11/09/2016, 12:51 PM

## 2016-11-09 NOTE — Care Management Obs Status (Signed)
[place] NOTIFICATION   Patient Details  Name: [name] MRN: [id number] Date of Birth: 11-30-1940   Medicare Observation Status Notification Given:  Yes    Dessa Phi, RN 11/09/2016, 12:51 PM

## 2016-11-09 NOTE — Progress Notes (Signed)
  Echocardiogram 2D Echocardiogram has been performed.  Tennis Mckinnon L Androw 11/09/2016, 1:41 PM

## 2016-11-09 NOTE — Consult Note (Signed)
[place] Nurse wound consult note Reason for Consult: Bilateral LEs with reabsorbed serum filled blisters (L>R), edema, erythema and warmth. Patient has been seen in the outpatient [facility] in the past; healed and then reopened.  She has had HHRn  [facility]) in the past and was pleased with that service.  She has tried compression stockings, but is unable to wear them (cannot don nor doff). She may be a candidate for Velcro compression garments, but these would have to be arranged through an outpatient provider post treatment.  For this purpose, a referral to the outpatient [facility] is suggested. Please also order/arrange for twice weekly HHRN, if you agree. Wound type:Venous insufficiency (chronic) with recent onset cellulitis Pressure Injury POA: No Measurement: Left LE with anterior area of deflated serum-filled blister measuring 14cm x 5cm (no depth). Hemosiderin staining. Decreasing erythema, warmth, edema. No drainage Right LE with previous pretibial wound (scar) from 2013, recent onset of serum-filled blisters now resolved.  Area of involvement measures 6cm x 4cm. Hemosiderin staining, no warmth, resolving erythema and edema.  Skin is dry. Wound bed:  As described above Drainage (amount, consistency, odor): none today Periwound:As described abvoe Dressing procedure/placement/frequency: I will implement twice weekly application of Unna's Boot therapy.  Patient will require [facility] services twice weekly for changes and follow up/care oversight is recommended from the outpatient [place]. A topical moisturizer and guidance for the Nursing staff is provided. [facility] nursing team will not follow, but will remain available to this patient, the nursing and medical teams.  Please re-consult if needed. Thanks, Maudie Flakes, MSN, RN, [place], Arther Abbott  Pager# [phone]

## 2016-11-09 NOTE — Progress Notes (Signed)
PROGRESS NOTE    [name]  WCH:[id number] DOB: 1940/09/06 DOA: 11/08/2016 PCP: [name], MD  Brief Narrative:  [name] is a 76 y.o. female with medical history significant for COPD, chronic diastolic CHF, hypertension, and chronic kidney disease stage III who presents at the direction of her PCP for evaluation of bilateral lower extremity pain, redness, and swelling. Patient reports that she been in her usual state of health until developing cough and dyspnea a couple weeks ago. She was treated with Ceftin here for this and reports improvement. Then over the past week, she has noted progressive swelling of the bilateral lower extremities with increased redness bilaterally and increasing tenderness bilaterally. Patient has chronic swelling and stasis dermatitis to the bilateral lower extremities, but reports increased redness and pain over the past week. There has also been some weeping of serous fluid from the bilateral distal lower extremities. There has not been any fevers or chills associated with this and she denies chest pain, palpitations, cough, recent prolonged immobilization, or personal or family history of VTE. She was evaluated for this by her PCP today and directed to the ED for further evaluation. She was admitted for Diastolic CHF and suspected lower venous stasis superimposed with a cellulitis.   Assessment & Plan:   Principal Problem:   Acute on chronic diastolic CHF (congestive heart failure) (HCC) Active Problems:   HTN (hypertension)   GERD   CKD (chronic kidney disease), stage III   Bilateral lower extremity edema   Chronic venous stasis dermatitis of both lower extremities   OSA (obstructive sleep apnea)   Anemia, iron deficiency   COLD (chronic obstructive lung disease) (HCC)   AKI (acute kidney injury) (HCC)   MGUS (monoclonal gammopathy of unknown significance)  1. Acute on chronic diastolic CHF  - Pt presents with progressive peripheral edema  and appears hypervolemic  - TTE (10/30/14) with EF 35-36%, grade 1 diastolic dysfunction, and moderate LAE; Repeat ECHO showed EF of 60-65% and Grade 2 Diastolic CHF - She is managed at home with Lasix 40 mg BID, losartan, and metoprolol  - She was given 40 mg IV Lasix in ED  - Plan to monitor on telemetry, continue diuresis with 40 mg IV Lasix q12h, SLIV, fluid-restrict diet, and follow daily wts, strict I/O's, and qAM chem panel   2. Chronic venous stasis dermatitis with suspected Cellulitic Component - Pt with chronic venous stasis with ulcers and hx of superimposed cellulitis  - Noted to have some serous weeping bilaterally - No fever or leukocytosis, lactate wnl, and there is slight heat -Blood cultures were obtained in ED;  -Checked inflammatory markers with CRP slightly high at 2.7 and ESR high at 59 -Will place on IV Ceftriaxone as did not appear purulent - Plan to diuresis as above, elevate legs, and request wound care consult   -Will likely need to check Lower Extremity Dopplers to r/o DVT  3. Acute kidney injury superimposed on CKD stage III  - SCr is 1.71 on admission, up from apparent baseline of 1.1-1.3; Improved to 1.57 - Possibly secondary to acute CHF; diuresing  - Hold losartan, renally-dose medications, repeat chem panel qAM   4. Anemia   - Hb/Hct went from 8.7/27.6 -> 7.7 24.7 - Pt with recent anemia panel suggesting IDA - Is followed by oncology for MGUS  - No apparent bleeding; continue iron and B12 supplements    5. GERD - Duodenal AVM and small hiatal hernia noted on EGD in 2016  -  Managed with daily PPI, will continue    6. COPD - Stable on admission  - Continue scheduled Breo and prn albuterol   7. Hypertension - BP at goal  - Losartan held in light of AKI  - Continue metoprolol as tolerated, treat with hydralazine IVP's prn    DVT prophylaxis: Heparin 5,000 sq Code Status: Partial Family Communication: No family present at bedside Disposition  Plan: Pending PT Evaluation  Consultants:   None   Procedures:  ECHOCARDIOGRAM Study Conclusions  - Left ventricle: The cavity size was normal. There was moderate   concentric hypertrophy. Systolic function was normal. The   estimated ejection fraction was in the range of 60% to 65%. Wall   motion was normal; there were no regional wall motion   abnormalities. Features are consistent with a pseudonormal left   ventricular filling pattern, with concomitant abnormal relaxation   and increased filling pressure (grade 2 diastolic dysfunction). - Mitral valve: There was mild regurgitation. - Left atrium: The atrium was mildly dilated. - Right atrium: The atrium was mildly dilated. - Pulmonary arteries: PA peak pressure: 32 mm Hg (S).   Antimicrobials: Anti-infectives    Start     Dose/Rate Route Frequency Ordered Stop   11/09/16 1000  cefTRIAXone (ROCEPHIN) 2 g in dextrose 5 % 50 mL IVPB     2 g 100 mL/hr over 30 Minutes Intravenous Every 24 hours 11/09/16 0953       Subjective: Seen and examined and legs were exquisitely tender to touch. Thinks leg swelling is improved. No Nausea or Vomiting. No other concerns or complaints.  Objective: Vitals:   11/09/16 0427 11/09/16 0856 11/09/16 1031 11/09/16 1410  BP: (!) 109/53  (!) 124/53 (!) 118/59  Pulse: 64  (!) 55 62  Resp: 20   18  Temp: 98.6 F (37 C)   98.4 F (36.9 C)  TempSrc: Oral   Oral  SpO2: 97% 100%  100%  Weight:      Height:        Intake/Output Summary (Last 24 hours) at 11/09/16 2005 Last data filed at 11/09/16 1800  Gross per 24 hour  Intake              240 ml  Output             1350 ml  Net            -1110 ml   Filed Weights   11/08/16 1548 11/08/16 2213  Weight: 104.8 kg (231 lb) 104.3 kg (229 lb 15 oz)   Examination: Physical Exam:  Constitutional: NAD and appears calm but uncomfortable Eyes: Lids and conjunctivae normal, sclerae anicteric  ENMT: External Ears, Nose appear normal. Grossly  normal hearing. Mucous membranes are moist.  Neck: Appears normal, supple, no cervical masses, normal ROM, no appreciable thyromegaly Respiratory: Clear to auscultation bilaterally, no wheezing, rales, rhonchi or crackles. Normal respiratory effort and patient is not tachypenic. No accessory muscle use.  Cardiovascular: RRR, no murmurs / rubs / gallops. S1 and S2 auscultated. 1+ lower Extremity edema with chronic stasis changes and eyrthema  Abdomen: Soft, non-tender, non-distended. No masses palpated. No appreciable hepatosplenomegaly. Bowel sounds positive x4.   GU: Deferred. Musculoskeletal: No clubbing / cyanosis of digits/nails.  Skin: Bilateral Leg Erythema with chronic stasis ulcers; No induration; Warm and dry.  Neurologic: CN 2-12 grossly intact with no focal deficits. Romberg sign cerebellar reflexes not assessed.  Psychiatric: Normal judgment and insight. Alert and oriented x 3. Normal  mood and appropriate affect.   Data Reviewed: I have personally reviewed following labs and imaging studies  CBC:  Recent Labs Lab 11/08/16 1815 11/09/16 0548  WBC 8.0 7.0  NEUTROABS 4.9 3.9  HGB 8.7* 7.7*  HCT 27.6* 24.7*  MCV 84.9 85.8  PLT 260 220   Basic Metabolic Panel:  Recent Labs Lab 11/08/16 1815 11/09/16 0548  NA 137 138  K 3.5 3.5  CL 101 102  CO2 29 30  GLUCOSE 106* 93  BUN 25* 23*  CREATININE 1.71* 1.57*  CALCIUM 8.7* 8.4*   GFR: Estimated Creatinine Clearance: 33.2 mL/min (A) (by C-G formula based on SCr of 1.57 mg/dL (H)). Liver Function Tests: No results for input(s): AST, ALT, ALKPHOS, BILITOT, PROT, ALBUMIN in the last 168 hours. No results for input(s): LIPASE, AMYLASE in the last 168 hours. No results for input(s): AMMONIA in the last 168 hours. Coagulation Profile: No results for input(s): INR, PROTIME in the last 168 hours. Cardiac Enzymes: No results for input(s): CKTOTAL, CKMB, CKMBINDEX, TROPONINI in the last 168 hours. BNP (last 3 results) No  results for input(s): PROBNP in the last 8760 hours. HbA1C: No results for input(s): HGBA1C in the last 72 hours. CBG: No results for input(s): GLUCAP in the last 168 hours. Lipid Profile: No results for input(s): CHOL, HDL, LDLCALC, TRIG, CHOLHDL, LDLDIRECT in the last 72 hours. Thyroid Function Tests: No results for input(s): TSH, T4TOTAL, FREET4, T3FREE, THYROIDAB in the last 72 hours. Anemia Panel: No results for input(s): VITAMINB12, FOLATE, FERRITIN, TIBC, IRON, RETICCTPCT in the last 72 hours. Sepsis Labs:  Recent Labs Lab 11/08/16 1822  LATICACIDVEN 0.75    Recent Results (from the past 240 hour(s))  Culture, blood (routine x 2)     Status: None (Preliminary result)   Collection Time: 11/08/16  6:15 PM  Result Value Ref Range Status   Specimen Description BLOOD LEFT ANTECUBITAL  Final   Special Requests   Final    BOTTLES DRAWN AEROBIC AND ANAEROBIC Blood Culture adequate volume   Culture   Final    NO GROWTH < 24 HOURS Performed at [facility], [address] [place], [place] [address]    Report Status PENDING  Incomplete  Culture, blood (routine x 2)     Status: None (Preliminary result)   Collection Time: 11/08/16  9:10 PM  Result Value Ref Range Status   Specimen Description BLOOD RIGHT ARM  Final   Special Requests   Final    BOTTLES DRAWN AEROBIC AND ANAEROBIC Blood Culture results may not be optimal due to an inadequate volume of blood received in culture bottles   Culture   Final    NO GROWTH < 24 HOURS Performed at [facility], [place] [address], [place], [place] [address]    Report Status PENDING  Incomplete    Radiology Studies: Dg Chest 2 View  Result Date: 11/08/2016 CLINICAL DATA:  Cellulitis of both lower extremities. History of CHF and COPD. Hypertension. EXAM: CHEST  2 VIEW COMPARISON:  10/30/2016 CXR, CT abdomen and pelvis from 03/19/2016 FINDINGS: Chronic mild hyperinflation of the lungs. No pneumonic consolidation or CHF. No  pneumothorax. Heart is top-normal in size with with soft tissue density along the right heart border consistent with an epicardial fat pad by prior CT. Aortic atherosclerosis at the arch without aneurysm. No pneumonic consolidation is seen. There is slight blunting of the left costophrenic angle likely secondary to epicardial fat as well. Chronic degenerative changes are  seen along the dorsal spine. No acute nor suspicious osseous abnormality. IMPRESSION: No active mild hyperinflation of the lungs. No acute pulmonary disease. Electronically Signed   By: Ashley Royalty M.D.   On: 11/08/2016 18:39   Scheduled Meds: . allopurinol  100 mg Oral QODAY  . aspirin EC  81 mg Oral Daily  . cefTRIAXone (ROCEPHIN)  IV  2 g Intravenous Q24H  . ferrous sulfate  325 mg Oral BID WC  . fluticasone furoate-vilanterol  1 puff Inhalation Daily  . furosemide  40 mg Intravenous Q12H  . heparin  5,000 Units Subcutaneous Q8H  . hydrocerin   Topical UD  . metoprolol  50 mg Oral BID  . sodium chloride flush  3 mL Intravenous Q12H  . cyanocobalamin  2,000 mcg Oral QPM   Continuous Infusions:   LOS: 0 days   Kerney Elbe, DO [facility] Pager [phone]  If 7PM-7AM, please contact night-coverage [email or web address] Password [facility] 11/09/2016, 8:05 PM

## 2016-11-09 NOTE — Plan of Care (Signed)
Problem: Safety: Goal: Ability to remain free from injury will improve Outcome: Completed/Met Date Met: 11/09/16 Bed alarm set.

## 2016-11-10 ENCOUNTER — Observation Stay (HOSPITAL_BASED_OUTPATIENT_CLINIC_OR_DEPARTMENT_OTHER): Payer: Medicare Other

## 2016-11-10 DIAGNOSIS — M7989 Other specified soft tissue disorders: Secondary | ICD-10-CM | POA: Diagnosis not present

## 2016-11-10 DIAGNOSIS — M79609 Pain in unspecified limb: Secondary | ICD-10-CM

## 2016-11-10 DIAGNOSIS — D472 Monoclonal gammopathy: Secondary | ICD-10-CM | POA: Diagnosis not present

## 2016-11-10 DIAGNOSIS — N183 Chronic kidney disease, stage 3 (moderate): Secondary | ICD-10-CM | POA: Diagnosis not present

## 2016-11-10 DIAGNOSIS — K219 Gastro-esophageal reflux disease without esophagitis: Secondary | ICD-10-CM | POA: Diagnosis not present

## 2016-11-10 DIAGNOSIS — G4733 Obstructive sleep apnea (adult) (pediatric): Secondary | ICD-10-CM

## 2016-11-10 DIAGNOSIS — I5033 Acute on chronic diastolic (congestive) heart failure: Secondary | ICD-10-CM | POA: Diagnosis not present

## 2016-11-10 DIAGNOSIS — D508 Other iron deficiency anemias: Secondary | ICD-10-CM | POA: Diagnosis not present

## 2016-11-10 DIAGNOSIS — I10 Essential (primary) hypertension: Secondary | ICD-10-CM | POA: Diagnosis not present

## 2016-11-10 DIAGNOSIS — N179 Acute kidney failure, unspecified: Secondary | ICD-10-CM | POA: Diagnosis not present

## 2016-11-10 DIAGNOSIS — R600 Localized edema: Secondary | ICD-10-CM | POA: Diagnosis not present

## 2016-11-10 DIAGNOSIS — R609 Edema, unspecified: Secondary | ICD-10-CM | POA: Diagnosis not present

## 2016-11-10 DIAGNOSIS — L03116 Cellulitis of left lower limb: Secondary | ICD-10-CM | POA: Diagnosis not present

## 2016-11-10 DIAGNOSIS — I872 Venous insufficiency (chronic) (peripheral): Secondary | ICD-10-CM | POA: Diagnosis not present

## 2016-11-10 LAB — BASIC METABOLIC PANEL
ANION GAP: 7 (ref 5–15)
BUN: 25 mg/dL — AB (ref 6–20)
CALCIUM: 8.6 mg/dL — AB (ref 8.9–10.3)
CO2: 32 mmol/L (ref 22–32)
CREATININE: 1.96 mg/dL — AB (ref 0.44–1.00)
Chloride: 98 mmol/L — ABNORMAL LOW (ref 101–111)
GFR calc Af Amer: 27 mL/min — ABNORMAL LOW (ref >60)
GFR calc non Af Amer: 24 mL/min — ABNORMAL LOW (ref >60)
GLUCOSE: 100 mg/dL — AB (ref 65–99)
Potassium: 4 mmol/L (ref 3.5–5.1)
Sodium: 137 mmol/L (ref 135–145)

## 2016-11-10 LAB — CBC WITH DIFFERENTIAL/PLATELET
BASOS ABS: 0 10*3/uL (ref 0.0–0.1)
BASOS PCT: 0 %
EOS PCT: 8 %
Eosinophils Absolute: 0.6 10*3/uL (ref 0.0–0.7)
HCT: 25.1 % — ABNORMAL LOW (ref 36.0–46.0)
Hemoglobin: 7.7 g/dL — ABNORMAL LOW (ref 12.0–15.0)
Lymphocytes Relative: 22 %
Lymphs Abs: 1.5 10*3/uL (ref 0.7–4.0)
MCH: 26.5 pg (ref 26.0–34.0)
MCHC: 30.7 g/dL (ref 30.0–36.0)
MCV: 86.3 fL (ref 78.0–100.0)
MONO ABS: 0.8 10*3/uL (ref 0.1–1.0)
MONOS PCT: 11 %
NEUTROS ABS: 4 10*3/uL (ref 1.7–7.7)
Neutrophils Relative %: 59 %
PLATELETS: 260 10*3/uL (ref 150–400)
RBC: 2.91 MIL/uL — ABNORMAL LOW (ref 3.87–5.11)
RDW: 14.6 % (ref 11.5–15.5)
WBC: 6.9 10*3/uL (ref 4.0–10.5)

## 2016-11-10 MED ORDER — FUROSEMIDE 40 MG PO TABS
40.0000 mg | ORAL_TABLET | Freq: Two times a day (BID) | ORAL | Status: DC
  Administered 2016-11-10 – 2016-11-11 (×2): 40 mg via ORAL
  Filled 2016-11-10 (×2): qty 1

## 2016-11-10 NOTE — Progress Notes (Signed)
Patient refusing CPAP for tonight. 

## 2016-11-10 NOTE — Progress Notes (Signed)
PROGRESS NOTE    [name]  RJJ:[id number] DOB: July 01, 1941 DOA: 11/08/2016 PCP: [name], MD  Brief Narrative:  [name] is a 76 y.o. female with medical history significant for COPD, chronic diastolic CHF, hypertension, and chronic kidney disease stage III who presents at the direction of her PCP for evaluation of bilateral lower extremity pain, redness, and swelling. Patient reports that she been in her usual state of health until developing cough and dyspnea a couple weeks ago. She was treated with Ceftin here for this and reports improvement. Then over the past week, she has noted progressive swelling of the bilateral lower extremities with increased redness bilaterally and increasing tenderness bilaterally. Patient has chronic swelling and stasis dermatitis to the bilateral lower extremities, but reports increased redness and pain over the past week. There has also been some weeping of serous fluid from the bilateral distal lower extremities. There has not been any fevers or chills associated with this and she denies chest pain, palpitations, cough, recent prolonged immobilization, or personal or family history of VTE. She was evaluated for this by her PCP today and directed to the ED for further evaluation. She was admitted for Diastolic CHF and suspected lower venous stasis superimposed with a cellulitis.   Assessment & Plan:   Principal Problem:   Acute on chronic diastolic CHF (congestive heart failure) (HCC) Active Problems:   HTN (hypertension)   GERD   CKD (chronic kidney disease), stage III   Bilateral lower extremity edema   Chronic venous stasis dermatitis of both lower extremities   OSA (obstructive sleep apnea)   Anemia, iron deficiency   COLD (chronic obstructive lung disease) (HCC)   AKI (acute kidney injury) (HCC)   MGUS (monoclonal gammopathy of unknown significance)  1. Acute on chronic diastolic CHF  - Pt presents with progressive peripheral edema  and appears hypervolemic  - TTE (10/30/14) with EF 01-60%, grade 1 diastolic dysfunction, and moderate LAE; Repeat ECHO showed EF of 60-65% and Grade 2 Diastolic CHF - She is managed at home with Lasix 40 mg BID, losartan, and metoprolol  - She was given 40 mg IV Lasix in ED  - Plan to monitor on telemetry,  -Diuresis with 40 mg IV Lasix q12h changed to po as Cr started going up,  -SLIV, fluid-restrict diet, and follow daily wts, strict I/O's, and qAM chem panel   2. Chronic venous stasis dermatitis with suspected Cellulitic Component - Pt with chronic venous stasis with ulcers and hx of superimposed cellulitis  - Noted to have some serous weeping bilaterally - No fever or leukocytosis, lactate wnl, and there more heat and redness today -Blood cultures were obtained in ED;  -Checked inflammatory markers with CRP slightly high at 2.7 and ESR high at 59 -Placed on IV Ceftriaxone as did not appear purulent but may need to change as is not improving as much  - Plan to diuresis as above, elevate legs, and appreciate wound care consult   -Checked Lower Extremity Dopplers to r/o DVT and showed no obvious evidence of Deep or Superficial Vein Thrombosis involving the Right and Left Lower Extremities.   3. Acute kidney injury superimposed on CKD stage III  - SCr is 1.71 on admission, up from apparent baseline of 1.1-1.3; Improved to 1.57 however worsened to 1.97 after IV Diuresis  - Possibly secondary to acute CHF; diuresis changed to po today because of elevation in Cr - Hold losartan, renally-dose medications, repeat chem panel qAM   4.  Anemia   - Hb/Hct went from 8.7/27.6 -> 7.7 24.7 -> 7.7/25.1 - Pt with recent anemia panel suggesting IDA - Is followed by oncology for MGUS  - No apparent bleeding; continue iron and B12 supplements    5. GERD - Duodenal AVM and small hiatal hernia noted on EGD in 2016  - Managed with daily PPI, will continue    6. COPD - Stable on admission  - Continue  scheduled Breo and prn albuterol   7. Hypertension - BP at goal  - Losartan held in light of AKI  - Continue metoprolol as tolerated, treat with hydralazine IVP's prn    DVT prophylaxis: Heparin 5,000 sq Code Status: Partial Family Communication: No family present at bedside Disposition Plan: Pending PT Evaluation  Consultants:   None   Procedures:  ECHOCARDIOGRAM Study Conclusions  - Left ventricle: The cavity size was normal. There was moderate   concentric hypertrophy. Systolic function was normal. The   estimated ejection fraction was in the range of 60% to 65%. Wall   motion was normal; there were no regional wall motion   abnormalities. Features are consistent with a pseudonormal left   ventricular filling pattern, with concomitant abnormal relaxation   and increased filling pressure (grade 2 diastolic dysfunction). - Mitral valve: There was mild regurgitation. - Left atrium: The atrium was mildly dilated. - Right atrium: The atrium was mildly dilated. - Pulmonary arteries: PA peak pressure: 32 mm Hg (S).   Antimicrobials: Anti-infectives    Start     Dose/Rate Route Frequency Ordered Stop   11/09/16 1000  cefTRIAXone (ROCEPHIN) 2 g in dextrose 5 % 50 mL IVPB     2 g 100 mL/hr over 30 Minutes Intravenous Every 24 hours 11/09/16 0953       Subjective: Seen and examined and legs were still tender and patient stated they were still in bad shape. No nausea or vomiting and she had a difficult time when she had her doppler done. No nausea or other concerns.    Objective: Vitals:   11/09/16 2200 11/10/16 0443 11/10/16 0816 11/10/16 1027  BP: (!) 144/39 (!) 118/48  (!) 139/47  Pulse:  (!) 54  66  Resp:  20    Temp:  97.7 F (36.5 C)    TempSrc:  Oral    SpO2:  100% 94%   Weight:  104.2 kg (229 lb 11.5 oz)    Height:        Intake/Output Summary (Last 24 hours) at 11/10/16 2004 Last data filed at 11/10/16 1535  Gross per 24 hour  Intake              770  ml  Output             1150 ml  Net             -380 ml   Filed Weights   11/08/16 1548 11/08/16 2213 11/10/16 0443  Weight: 104.8 kg (231 lb) 104.3 kg (229 lb 15 oz) 104.2 kg (229 lb 11.5 oz)   Examination: Physical Exam:  Constitutional: NAD and appears calm but uncomfortable Eyes: Lids and conjunctivae normal, sclerae anicteric  ENMT: External Ears, Nose appear normal. Grossly normal hearing. Mucous membranes are moist.  Neck: Appears normal, supple, no cervical masses, normal ROM, no appreciable thyromegaly Respiratory: Clear to auscultation bilaterally, no wheezing, rales, rhonchi or crackles. Normal respiratory effort and patient is not tachypenic. No accessory muscle use.  Cardiovascular: RRR, no murmurs / rubs /  gallops. S1 and S2 auscultated. 1+ lower Extremity edema with chronic stasis changes and eyrthema and warmth Abdomen: Soft, non-tender, non-distended. No masses palpated. No appreciable hepatosplenomegaly. Bowel sounds positive x4.   GU: Deferred. Musculoskeletal: No clubbing / cyanosis of digits/nails.  Skin: Bilateral Leg Erythema and warmth with chronic stasis ulcers; No induration; Warm and dry.  Neurologic: CN 2-12 grossly intact with no focal deficits. Romberg sign cerebellar reflexes not assessed.  Psychiatric: Normal judgment and insight. Alert and oriented x 3. Normal mood and appropriate affect.   Data Reviewed: I have personally reviewed following labs and imaging studies  CBC:  Recent Labs Lab 11/08/16 1815 11/09/16 0548 11/10/16 0904  WBC 8.0 7.0 6.9  NEUTROABS 4.9 3.9 4.0  HGB 8.7* 7.7* 7.7*  HCT 27.6* 24.7* 25.1*  MCV 84.9 85.8 86.3  PLT 260 252 284   Basic Metabolic Panel:  Recent Labs Lab 11/08/16 1815 11/09/16 0548 11/10/16 0558  NA 137 138 137  K 3.5 3.5 4.0  CL 101 102 98*  CO2 29 30 32  GLUCOSE 106* 93 100*  BUN 25* 23* 25*  CREATININE 1.71* 1.57* 1.96*  CALCIUM 8.7* 8.4* 8.6*   GFR: Estimated Creatinine Clearance: 26.6  mL/min (A) (by C-G formula based on SCr of 1.96 mg/dL (H)). Liver Function Tests: No results for input(s): AST, ALT, ALKPHOS, BILITOT, PROT, ALBUMIN in the last 168 hours. No results for input(s): LIPASE, AMYLASE in the last 168 hours. No results for input(s): AMMONIA in the last 168 hours. Coagulation Profile: No results for input(s): INR, PROTIME in the last 168 hours. Cardiac Enzymes: No results for input(s): CKTOTAL, CKMB, CKMBINDEX, TROPONINI in the last 168 hours. BNP (last 3 results) No results for input(s): PROBNP in the last 8760 hours. HbA1C: No results for input(s): HGBA1C in the last 72 hours. CBG: No results for input(s): GLUCAP in the last 168 hours. Lipid Profile: No results for input(s): CHOL, HDL, LDLCALC, TRIG, CHOLHDL, LDLDIRECT in the last 72 hours. Thyroid Function Tests: No results for input(s): TSH, T4TOTAL, FREET4, T3FREE, THYROIDAB in the last 72 hours. Anemia Panel: No results for input(s): VITAMINB12, FOLATE, FERRITIN, TIBC, IRON, RETICCTPCT in the last 72 hours. Sepsis Labs:  Recent Labs Lab 11/08/16 1822  LATICACIDVEN 0.75    Recent Results (from the past 240 hour(s))  Culture, blood (routine x 2)     Status: None (Preliminary result)   Collection Time: 11/08/16  6:15 PM  Result Value Ref Range Status   Specimen Description BLOOD LEFT ANTECUBITAL  Final   Special Requests   Final    BOTTLES DRAWN AEROBIC AND ANAEROBIC Blood Culture adequate volume   Culture   Final    NO GROWTH 2 DAYS Performed at [facility], [address], [place], [place] [address]    Report Status PENDING  Incomplete  Culture, blood (routine x 2)     Status: None (Preliminary result)   Collection Time: 11/08/16  9:10 PM  Result Value Ref Range Status   Specimen Description BLOOD RIGHT ARM  Final   Special Requests   Final    BOTTLES DRAWN AEROBIC AND ANAEROBIC Blood Culture results may not be optimal due to an inadequate volume of blood received in culture bottles    Culture   Final    NO GROWTH 2 DAYS Performed at [facility], [place] [address], [place], [place] [address]    Report Status PENDING  Incomplete    Radiology Studies: No results found. Scheduled Meds: .  allopurinol  100 mg Oral QODAY  . aspirin EC  81 mg Oral Daily  . cefTRIAXone (ROCEPHIN)  IV  2 g Intravenous Q24H  . ferrous sulfate  325 mg Oral BID WC  . fluticasone furoate-vilanterol  1 puff Inhalation Daily  . furosemide  40 mg Oral BID  . heparin  5,000 Units Subcutaneous Q8H  . hydrocerin   Topical UD  . metoprolol  50 mg Oral BID  . sodium chloride flush  3 mL Intravenous Q12H  . cyanocobalamin  2,000 mcg Oral QPM   Continuous Infusions:   LOS: 0 days   Kerney Elbe, DO [facility] Pager [phone]  If 7PM-7AM, please contact night-coverage [email or web address] Password [facility] 11/10/2016, 8:04 PM

## 2016-11-10 NOTE — Progress Notes (Signed)
NCM notified attending of [place], NCM explained ABN and declined to sign. States she is does not understand billing. Jonnie Finner RN CCM Case Mgmt phone [phone]

## 2016-11-10 NOTE — Progress Notes (Signed)
**  Preliminary report by tech**  Bilateral lower extremity venous duplex completed. There is no obvious evidence of deep or superficial vein thrombosis involving the right and left lower extremities. All clearly visualized vessels appear patent and compressible. There is no evidence of Baker's cysts bilaterally. Results were given to the patient's nurse, Gretchen.  11/10/16 10:53 AM [name] RVT

## 2016-11-10 NOTE — Plan of Care (Signed)
Problem: Cardiac: Goal: Ability to achieve and maintain adequate cardiopulmonary perfusion will improve Outcome: Progressing EF 60-65%.  Pt -1.6L since admission. Weight -0.581kg

## 2016-11-11 ENCOUNTER — Encounter: Payer: Self-pay | Admitting: Internal Medicine

## 2016-11-11 DIAGNOSIS — R600 Localized edema: Secondary | ICD-10-CM | POA: Diagnosis not present

## 2016-11-11 DIAGNOSIS — I5033 Acute on chronic diastolic (congestive) heart failure: Secondary | ICD-10-CM | POA: Diagnosis not present

## 2016-11-11 DIAGNOSIS — R609 Edema, unspecified: Secondary | ICD-10-CM | POA: Diagnosis not present

## 2016-11-11 DIAGNOSIS — N179 Acute kidney failure, unspecified: Secondary | ICD-10-CM | POA: Diagnosis not present

## 2016-11-11 DIAGNOSIS — D508 Other iron deficiency anemias: Secondary | ICD-10-CM | POA: Diagnosis not present

## 2016-11-11 LAB — CBC WITH DIFFERENTIAL/PLATELET
Basophils Absolute: 0 10*3/uL (ref 0.0–0.1)
Basophils Relative: 0 %
Eosinophils Absolute: 0.7 10*3/uL (ref 0.0–0.7)
Eosinophils Relative: 9 %
HEMATOCRIT: 25.1 % — AB (ref 36.0–46.0)
Hemoglobin: 7.8 g/dL — ABNORMAL LOW (ref 12.0–15.0)
LYMPHS ABS: 1.5 10*3/uL (ref 0.7–4.0)
Lymphocytes Relative: 20 %
MCH: 26.6 pg (ref 26.0–34.0)
MCHC: 31.1 g/dL (ref 30.0–36.0)
MCV: 85.7 fL (ref 78.0–100.0)
Monocytes Absolute: 0.9 10*3/uL (ref 0.1–1.0)
Monocytes Relative: 12 %
NEUTROS ABS: 4.4 10*3/uL (ref 1.7–7.7)
NEUTROS PCT: 59 %
Platelets: 241 10*3/uL (ref 150–400)
RBC: 2.93 MIL/uL — ABNORMAL LOW (ref 3.87–5.11)
RDW: 14.6 % (ref 11.5–15.5)
WBC: 7.5 10*3/uL (ref 4.0–10.5)

## 2016-11-11 LAB — COMPREHENSIVE METABOLIC PANEL
ALT: 13 U/L — ABNORMAL LOW (ref 14–54)
ANION GAP: 6 (ref 5–15)
AST: 16 U/L (ref 15–41)
Albumin: 2.6 g/dL — ABNORMAL LOW (ref 3.5–5.0)
Alkaline Phosphatase: 44 U/L (ref 38–126)
BILIRUBIN TOTAL: 0.4 mg/dL (ref 0.3–1.2)
BUN: 28 mg/dL — ABNORMAL HIGH (ref 6–20)
CALCIUM: 8.7 mg/dL — AB (ref 8.9–10.3)
CO2: 31 mmol/L (ref 22–32)
Chloride: 100 mmol/L — ABNORMAL LOW (ref 101–111)
Creatinine, Ser: 1.89 mg/dL — ABNORMAL HIGH (ref 0.44–1.00)
GFR calc non Af Amer: 25 mL/min — ABNORMAL LOW (ref >60)
GFR, EST AFRICAN AMERICAN: 29 mL/min — AB (ref >60)
GLUCOSE: 102 mg/dL — AB (ref 65–99)
Potassium: 4.2 mmol/L (ref 3.5–5.1)
Sodium: 137 mmol/L (ref 135–145)
TOTAL PROTEIN: 6.3 g/dL — AB (ref 6.5–8.1)

## 2016-11-11 LAB — PHOSPHORUS: PHOSPHORUS: 3.9 mg/dL (ref 2.5–4.6)

## 2016-11-11 LAB — MAGNESIUM: Magnesium: 2.4 mg/dL (ref 1.7–2.4)

## 2016-11-11 MED ORDER — CEFPODOXIME PROXETIL 100 MG PO TABS: 400.0000 mg | ORAL_TABLET | Freq: Every day | ORAL | 0 refills | Status: DC

## 2016-11-11 MED ORDER — HYDROCERIN EX CREA: 1.0000 "application " | TOPICAL_CREAM | CUTANEOUS | 0 refills | Status: DC

## 2016-11-11 NOTE — Progress Notes (Signed)
PT Cancellation Note  Patient Details Name: [name] MRN: [id number] DOB: 07-01-1941   Cancelled Treatment:    Reason Eval/Treat Not Completed: Attempted PT eval. Pt requested PT check back later.    Weston Anna, MPT Pager: [phone]

## 2016-11-11 NOTE — Discharge Summary (Signed)
Physician Discharge Summary  ALEJANDRINA RAIMER IDP:[id number] DOB: 1941-02-28 DOA: 11/08/2016  PCP: [name], MD  Admit date: 11/08/2016 Discharge date: 11/11/2016  Admitted From: Home Disposition:  [facility] and RN  Recommendations for Outpatient Follow-up:  1. Follow up with PCP in 1-2 weeks 2. Follow up with Cardiology as an outpatient 3. Follow up with Oncology for management of MGUS 4. Please obtain CMP/CBC in one week  Home Health: YES HHRN and HHPT Equipment/Devices: None as patient already has home O2  Discharge Condition: Stable CODE STATUS: FULL CODE Diet recommendation: Heart Healthy   Brief/Interim Summary: RAKEYA GLAB a 76 y.o.femalewith medical history significant for COPD, chronic diastolic CHF, hypertension, and chronic kidney disease stage III who presents at the direction of her PCP for evaluation of bilateral lower extremity pain, redness, and swelling. Patient reports that she been in her usual state of health until developing cough and dyspnea a couple weeks ago. She was treated with Ceftin here for this and reports improvement. Then over the past week, she has noted progressive swelling of the bilateral lower extremities with increased redness bilaterally and increasing tenderness bilaterally. Patient has chronic swelling and stasis dermatitis to the bilateral lower extremities, but reports increased redness and pain over the past week. There has also been some weeping of serous fluid from the bilateral distal lower extremities. There has not been any fevers or chills associated with this and she denies chest pain, palpitations, cough, recent prolonged immobilization, or personal or family history of VTE. She was evaluated for this by her PCP today and directed to the ED for further evaluation. She was admitted for Diastolic CHF and suspected lower venous stasis superimposed with a cellulitis. She was diuresed and placed on Abx. She improved and WOC nurse  came and placed UNA boots on her. She will need to follow up with PCP and Cardiology as an outpatient and [place] will come evaluate for dressing changes.  Discharge Diagnoses:  Principal Problem:   Acute on chronic diastolic CHF (congestive heart failure) (HCC) Active Problems:   HTN (hypertension)   GERD   CKD (chronic kidney disease), stage III   Bilateral lower extremity edema   Chronic venous stasis dermatitis of both lower extremities   OSA (obstructive sleep apnea)   Anemia, iron deficiency   COLD (chronic obstructive lung disease) (HCC)   AKI (acute kidney injury) (HCC)   MGUS (monoclonal gammopathy of unknown significance)  1. Acute on chronic diastolic CHF  - Pt presented with progressive peripheral edema and appears hypervolemic  - TTE (10/30/14) with EF 44-31%, grade 1 diastolic dysfunction, and moderate LAE; Repeat ECHO showed EF of 60-65% and Grade 2 Diastolic CHF - She is managed at home with Lasix 40 mg BID, losartan, and metoprolol; Will continue Lasix and Metoprolol and Hold Losartan until re-evaluated as an outpatient for her BUN/Cr - She was given 40 mg IV Lasix in ED  - Plan to monitor on telemetry,  -Diuresis with 40 mg IV Lasix q12h changed to po as Cr started going up,  -SLIV, fluid-restrict diet, and follow daily wts, strict I/O's, and repeat CMP in AM -Patient is Negative 2.6 Liters -Follow up with Cardiology or PCP as an outpatient.   2. Chronic venous stasis dermatitis with suspected Cellulitic Component - Pt with chronic venous stasis with ulcers and hx of superimposed cellulitis  - Noted to have some serous weeping bilaterally - No fever or leukocytosis, lactate wnl, and there more heat  and redness; Wrapped in UNA Boots -Blood cultures were obtained in ED showed NGTD at 3 days;  -Checked inflammatory markers with CRP slightly high at 2.7 and ESR high at 59 -Placed on IV Ceftriaxone as did not appear purulent and changed to po Vantin 400 mg po  Daily x 7 more days - Diuresed as above, elevate legs, and appreciate wound care consult -> Placed on UNA Boots will have home Health RN Come out to change  -Checked Lower Extremity Dopplers to r/o DVT and showed no obvious evidence of Deep or Superficial Vein Thrombosis involving the Right and Left Lower Extremities.  -Will need PCP follow up  3. Acute kidney injury superimposed on CKD stage III  - SCr is 1.71 on admission, up from apparent baseline of 1.1-1.3; Improved to 1.57 however worsened to 1.97 after IV Diuresis so was placed back on po Diuresis and Cr now 1.89 - Possibly secondary to acute CHF; diuresis changed to po because of elevation in Cr - Hold losartan, renally-dose medications - Follow up CMP as an outpatient.   4. Normocytic Anemia  - Stable - Hb/Hct went from 8.7/27.6 -> 7.7 24.7 -> 7.7/25.1 -> 7.8/25.1 - Pt with recent anemia panel suggesting IDA - Is followed by oncology for MGUS  - No apparent bleeding; continue iron and B12 supplements  -Follow up with Dr. Alen Blew as an outpatient.   5. GERD - Duodenal AVM and small hiatal hernia noted on EGD in 2016  - Managed with daily PPI, continue home dose  6. COPD - Stable on admission  - Continue scheduled Breo and prn albuterol   7. Hypertension - BP at goal  - Losartan held in light of AKI  - Continue metoprolol as tolerated, - Follow up CMP as an outpatient   Discharge Instructions  Discharge Instructions    Call MD for:  difficulty breathing, headache or visual disturbances    Complete by:  As directed    Call MD for:  extreme fatigue    Complete by:  As directed    Call MD for:  persistant dizziness or light-headedness    Complete by:  As directed    Call MD for:  persistant nausea and vomiting    Complete by:  As directed    Call MD for:  redness, tenderness, or signs of infection (pain, swelling, redness, odor or green/yellow discharge around incision site)    Complete by:  As directed     Call MD for:  severe uncontrolled pain    Complete by:  As directed    Call MD for:  temperature >100.4    Complete by:  As directed    Diet - low sodium heart healthy    Complete by:  As directed    Discharge instructions    Complete by:  As directed    Follow up with PCP and Cardiology as an outpatient. Take all medications as prescribed. Hold on taking Losartan until kidney function is improved and re-evaluated by Cardiology or PCP. If symptoms change or worsen please return to the ED for evaluation.   Increase activity slowly    Complete by:  As directed      Allergies as of 11/11/2016      Reactions   Celebrex [celecoxib]    edema   Enalapril Cough   Lipitor [atorvastatin]    Muscle aches   Amlodipine Besylate    REACTION: edema   Amoxicillin    REACTION: Diarrhea   Codeine Sulfate  REACTION: Nausea   Hydrocodone-acetaminophen    REACTION: Nausea      Medication List    STOP taking these medications   cefdinir 300 MG capsule Commonly known as:  OMNICEF   losartan 100 MG tablet Commonly known as:  COZAAR     TAKE these medications   acetaminophen 325 MG tablet Commonly known as:  TYLENOL Take 2 tablets (650 mg total) by mouth every 6 (six) hours as needed for mild pain or headache (or Fever >/= 101).   albuterol (2.5 MG/3ML) 0.083% nebulizer solution Commonly known as:  PROVENTIL Take 3 mLs (2.5 mg total) by nebulization every 6 (six) hours as needed for wheezing or shortness of breath.   albuterol 108 (90 Base) MCG/ACT inhaler Commonly known as:  PROAIR HFA Inhale 1-2 puffs into the lungs every 6 (six) hours as needed for wheezing or shortness of breath.   allopurinol 100 MG tablet Commonly known as:  ZYLOPRIM TAKE 1 TABLET BY MOUTH EVERY DAY FOR GOUT   aspirin 81 MG EC tablet Take 81 mg by mouth daily.   BREO ELLIPTA 100-25 MCG/INH Aepb Generic drug:  fluticasone furoate-vilanterol INHALE 1 PUFF INTO THE LUNGS DAILY   cefpodoxime 100 MG  tablet Commonly known as:  VANTIN Take 4 tablets (400 mg total) by mouth daily. Start taking on:  11/12/2016   cyanocobalamin 2000 MCG tablet Take 1 tablet (2,000 mcg total) by mouth daily. What changed:  when to take this   ferrous sulfate 325 (65 FE) MG tablet Take 1 tablet (325 mg total) by mouth 2 (two) times daily with a meal.   furosemide 40 MG tablet Commonly known as:  LASIX TAKE 1 TABLET BY MOUTH TWICE DAILY   hydrocerin Crea Apply 1 application topically as directed.   metoprolol 50 MG tablet Commonly known as:  LOPRESSOR TAKE 1 TABLET BY MOUTH TWICE DAILY   nystatin powder Commonly known as:  MYCOSTATIN/NYSTOP Apply 1 Bottle topically 2 (two) times daily as needed (skin irritation.). Use as directed twice per day as needed   promethazine-dextromethorphan 6.25-15 MG/5ML syrup Commonly known as:  PROMETHAZINE-DM Take 5 mLs by mouth 4 (four) times daily as needed for cough.   triamcinolone cream 0.5 % Commonly known as:  KENALOG Apply 1 application topically 3 (three) times daily.   VASCULERA Tabs Take 1 tablet by mouth daily.   Vitamin D (Ergocalciferol) 50000 units Caps capsule Commonly known as:  DRISDOL TAKE ONE CAPSULE BY MOUTH ONCE WEEKLY       Allergies  Allergen Reactions  . Celebrex [Celecoxib]     edema  . Enalapril Cough  . Lipitor [Atorvastatin]     Muscle aches  . Amlodipine Besylate     REACTION: edema  . Amoxicillin     REACTION: Diarrhea  . Codeine Sulfate     REACTION: Nausea  . Hydrocodone-Acetaminophen     REACTION: Nausea   Consultations:  None  Procedures/Studies: Dg Chest 2 View  Result Date: 11/08/2016 CLINICAL DATA:  Cellulitis of both lower extremities. History of CHF and COPD. Hypertension. EXAM: CHEST  2 VIEW COMPARISON:  10/30/2016 CXR, CT abdomen and pelvis from 03/19/2016 FINDINGS: Chronic mild hyperinflation of the lungs. No pneumonic consolidation or CHF. No pneumothorax. Heart is top-normal in size with with  soft tissue density along the right heart border consistent with an epicardial fat pad by prior CT. Aortic atherosclerosis at the arch without aneurysm. No pneumonic consolidation is seen. There is slight blunting of the left costophrenic angle  likely secondary to epicardial fat as well. Chronic degenerative changes are seen along the dorsal spine. No acute nor suspicious osseous abnormality. IMPRESSION: No active mild hyperinflation of the lungs. No acute pulmonary disease. Electronically Signed   By: Ashley Royalty M.D.   On: 11/08/2016 18:39   Dg Chest 2 View  Result Date: 10/30/2016 CLINICAL DATA:  Cough, chest congestion, wheezing, and intermittent fever for the past 7 days. History of CHF, COPD, former smoker. EXAM: CHEST  2 VIEW COMPARISON:  PA and lateral chest x-ray of July 05, 2015 FINDINGS: The lungs are mildly hyperinflated. The interstitial markings are coarse. There is no alveolar infiltrate. There is no pleural effusion. The heart is top-normal in size. The pulmonary vascularity is normal. There is calcification in the wall of the ascending and descending thoracic aorta. The bony thorax exhibits no acute abnormality. IMPRESSION: COPD.  There is no pneumonia nor CHF. Thoracic aortic atherosclerosis. Electronically Signed   By: David  Martinique M.D.   On: 10/30/2016 11:22    ECHOCARDIOGRAM Study Conclusions  - Left ventricle: The cavity size was normal. There was moderate   concentric hypertrophy. Systolic function was normal. The   estimated ejection fraction was in the range of 60% to 65%. Wall   motion was normal; there were no regional wall motion   abnormalities. Features are consistent with a pseudonormal left   ventricular filling pattern, with concomitant abnormal relaxation   and increased filling pressure (grade 2 diastolic dysfunction). - Mitral valve: There was mild regurgitation. - Left atrium: The atrium was mildly dilated. - Right atrium: The atrium was mildly dilated. -  Pulmonary arteries: PA peak pressure: 32 mm Hg (S).  BILATERAL LOWER EXTREMITY VENOUS DUPLEX There is no obvious evidence of deep or superficial vein thrombosis involving the right and left lower extremities. All clearly visualized vessels appear patent and compressible. There is no evidence of Baker's cysts bilaterally  Subjective: Seen and examined at bedside and was doing better. Legs wrapped but per patient not as painful. No nausea or vomiting today but had 1 episode yesterday. No other concerns or complaints and ready to go home.   Discharge Exam: Vitals:   11/11/16 0841 11/11/16 1313  BP: (!) 141/46 (!) 131/45  Pulse: 81 63  Resp: 18 18  Temp:  98.6 F (37 C)   Vitals:   11/11/16 0400 11/11/16 0746 11/11/16 0841 11/11/16 1313  BP: (!) 126/48  (!) 141/46 (!) 131/45  Pulse: 73  81 63  Resp: _0 Temp: 98.7 F (37.1 C)   98.6 F (37 C)  TempSrc: Oral   Oral  SpO2: 94% 94% 92% 96%  Weight: 105.7 kg (233 lb 0.4 oz)     Height:       General: Pt is alert, awake, not in acute distress Cardiovascular: RRR, S1/S2 +, no rubs, no gallops Respiratory: CTA bilaterally, no wheezing, no rhonchi; Wearing Supplemental O2 via Finger Abdominal: Soft, NT, ND, bowel sounds + Extremities: Legs wrapped in UNA boots; Unable to assess swelling and warmth but not as tender to touch.  The results of significant diagnostics from this hospitalization (including imaging, microbiology, ancillary and laboratory) are listed below for reference.    Microbiology: Recent Results (from the past 240 hour(s))  Culture, blood (routine x 2)     Status: None (Preliminary result)   Collection Time: 11/08/16  6:15 PM  Result Value Ref Range Status   Specimen Description BLOOD LEFT ANTECUBITAL  Final  Special Requests   Final    BOTTLES DRAWN AEROBIC AND ANAEROBIC Blood Culture adequate volume   Culture   Final    NO GROWTH 3 DAYS Performed at [facility], [place] [address], [place], [place]  [address]    Report Status PENDING  Incomplete  Culture, blood (routine x 2)     Status: None (Preliminary result)   Collection Time: 11/08/16  9:10 PM  Result Value Ref Range Status   Specimen Description BLOOD RIGHT ARM  Final   Special Requests   Final    BOTTLES DRAWN AEROBIC AND ANAEROBIC Blood Culture results may not be optimal due to an inadequate volume of blood received in culture bottles   Culture   Final    NO GROWTH 3 DAYS Performed at [facility], [place] [address] [place], [place] [address]    Report Status PENDING  Incomplete    Labs: BNP (last 3 results)  Recent Labs  11/08/16 1815  BNP 355.7*   Basic Metabolic Panel:  Recent Labs Lab 11/08/16 1815 11/09/16 0548 11/10/16 0558 11/11/16 0547  NA 137 138 137 137  K 3.5 3.5 4.0 4.2  CL 101 102 98* 100*  CO2 29 30 32 31  GLUCOSE 106* 93 100* 102*  BUN 25* 23* 25* 28*  CREATININE 1.71* 1.57* 1.96* 1.89*  CALCIUM 8.7* 8.4* 8.6* 8.7*  MG  --   --   --  2.4  PHOS  --   --   --  3.9   Liver Function Tests:  Recent Labs Lab 11/11/16 0547  AST 16  ALT 13*  ALKPHOS 44  BILITOT 0.4  PROT 6.3*  ALBUMIN 2.6*   No results for input(s): LIPASE, AMYLASE in the last 168 hours. No results for input(s): AMMONIA in the last 168 hours. CBC:  Recent Labs Lab 11/08/16 1815 11/09/16 0548 11/10/16 0904 11/11/16 0547  WBC 8.0 7.0 6.9 7.5  NEUTROABS 4.9 3.9 4.0 4.4  HGB 8.7* 7.7* 7.7* 7.8*  HCT 27.6* 24.7* 25.1* 25.1*  MCV 84.9 85.8 86.3 85.7  PLT 260 252 260 241   Cardiac Enzymes: No results for input(s): CKTOTAL, CKMB, CKMBINDEX, TROPONINI in the last 168 hours. BNP: Invalid input(s): POCBNP CBG: No results for input(s): GLUCAP in the last 168 hours. D-Dimer No results for input(s): DDIMER in the last 72 hours. Hgb A1c No results for input(s): HGBA1C in the last 72 hours. Lipid Profile No results for input(s): CHOL, HDL, LDLCALC, TRIG, CHOLHDL, LDLDIRECT in the last 72 hours. Thyroid function  studies No results for input(s): TSH, T4TOTAL, T3FREE, THYROIDAB in the last 72 hours.  Invalid input(s): FREET3 Anemia work up No results for input(s): VITAMINB12, FOLATE, FERRITIN, TIBC, IRON, RETICCTPCT in the last 72 hours. Urinalysis    Component Value Date/Time   COLORURINE YELLOW 04/22/2014 1420   APPEARANCEUR CLEAR 04/22/2014 1420   LABSPEC <=1.005 (A) 04/22/2014 1420   PHURINE 6.0 04/22/2014 1420   GLUCOSEU NEGATIVE 04/22/2014 1420   HGBUR NEGATIVE 04/22/2014 1420   HGBUR moderate 02/02/2008 1439   BILIRUBINUR NEGATIVE 04/22/2014 1420   KETONESUR NEGATIVE 04/22/2014 1420   PROTEINUR 100 (A) 06/01/2013 1722   UROBILINOGEN 0.2 04/22/2014 1420   NITRITE NEGATIVE 04/22/2014 1420   LEUKOCYTESUR NEGATIVE 04/22/2014 1420   Sepsis Labs Invalid input(s): PROCALCITONIN,  WBC,  LACTICIDVEN Microbiology Recent Results (from the past 240 hour(s))  Culture, blood (routine x 2)     Status: None (Preliminary result)   Collection Time: 11/08/16  6:15 PM  Result Value Ref Range Status   Specimen Description BLOOD LEFT ANTECUBITAL  Final   Special Requests   Final    BOTTLES DRAWN AEROBIC AND ANAEROBIC Blood Culture adequate volume   Culture   Final    NO GROWTH 3 DAYS Performed at [facility], [address] [place], [place] [address]    Report Status PENDING  Incomplete  Culture, blood (routine x 2)     Status: None (Preliminary result)   Collection Time: 11/08/16  9:10 PM  Result Value Ref Range Status   Specimen Description BLOOD RIGHT ARM  Final   Special Requests   Final    BOTTLES DRAWN AEROBIC AND ANAEROBIC Blood Culture results may not be optimal due to an inadequate volume of blood received in culture bottles   Culture   Final    NO GROWTH 3 DAYS Performed at [facility], [place] [address] [place], [place] [address]    Report Status PENDING  Incomplete   Time coordinating discharge: 40 minutes  SIGNED:  Kerney Elbe, DO [facility] 11/11/2016, 3:01 PM Pager [phone]  If 7PM-7AM, please contact night-coverage [email or web address] Password [email or web address]

## 2016-11-11 NOTE — Plan of Care (Signed)
Problem: Pain Managment: Goal: General experience of comfort will improve Pain management improved from yesterday. States she has not needed pain med since yesterday.   Problem: Physical Regulation: Goal: Will remain free from infection Outcome: Progressing On IV abx.   Problem: Skin Integrity: Goal: Risk for impaired skin integrity will decrease Outcome: Progressing WOC consulted. Una boots in place, to be changed q Tues/Fri.   Problem: Activity: Goal: Risk for activity intolerance will decrease Outcome: Progressing Ambulating in room w/ RW and stand by assist. Await PT consult.   Problem: Fluid Volume: Goal: Ability to maintain a balanced intake and output will improve Outcome: Progressing Transitioned to PO lasix, continues to diurese.

## 2016-11-11 NOTE — Progress Notes (Signed)
D/C instructions reviewed w/ pt. Pt verbalizes understanding and all questions answered. HF education reviewed. Pt in possession of d/c instructions and aware of need to p/u scripts at pharmacy. Husband has gone home to bring back pt's home O2 tank and will return within the hour to take pt home.

## 2016-11-11 NOTE — Evaluation (Addendum)
Physical Therapy Evaluation-1x Patient Details Name: [name] MRN: [id number] DOB: [birth year] Today's Date: 11/11/2016   History of Present Illness  76 yo female admitted with CHF. Hx of obesity, COPD-O2 dep, chronic LE swelling, HTN, CKD, CHF, venous stasis  Clinical Impression  On eval, pt required Min guard-Min assist for mobility. She walked ~65 feet with a straight cane. Pt was unsteady at times so I recommended, to her, that she use a RW for ambulation safety. Pt stated she already has a RW at home. Recommend HHPT follow up.     Follow Up Recommendations Home health PT;Supervision - Intermittent    Equipment Recommendations  None recommended by PT    Recommendations for Other Services       Precautions / Restrictions Precautions Precautions: Fall Precaution Comments: monitor O2 sats-O2 dep Restrictions Weight Bearing Restrictions: No      Mobility  Bed Mobility               General bed mobility comments: pt was sitting EOB at start of session  Transfers Overall transfer level: Needs assistance Equipment used: Straight cane Transfers: Sit to/from Stand Sit to Stand: Min guard         General transfer comment: close guard for safety.   Ambulation/Gait Ambulation/Gait assistance: Min assist Ambulation Distance (Feet): 65 Feet Assistive device: Straight cane Gait Pattern/deviations: Step-through pattern;Decreased stride length     General Gait Details: Unsteady at times requring small amount of assist to stabilize. O2 sats 83% on RA.   Stairs            Wheelchair Mobility    Modified Rankin (Stroke Patients Only)       Balance                                             Pertinent Vitals/Pain Pain Assessment: 0-10 Pain Score: 5  Pain Location: bil LEs Pain Descriptors / Indicators: Sore Pain Intervention(s): Monitored during session    Home Living Family/patient expects to be discharged to:: Private  residence Living Arrangements: Spouse/significant other   Type of Home: House Home Access: Stairs to enter Entrance Stairs-Rails: None Entrance Stairs-Number of Steps: 1 Home Layout: One level Home Equipment: Cane - single point;Walker - 2 wheels;Bedside commode;Grab bars - tub/shower      Prior Function Level of Independence: Independent with assistive device(s)         Comments: uses cane for ambulation     Hand Dominance        Extremity/Trunk Assessment   Upper Extremity Assessment Upper Extremity Assessment: Generalized weakness    Lower Extremity Assessment Lower Extremity Assessment: Generalized weakness    Cervical / Trunk Assessment Cervical / Trunk Assessment: Normal  Communication   Communication: No difficulties  Cognition Arousal/Alertness: Awake/alert Behavior During Therapy: WFL for tasks assessed/performed Overall Cognitive Status: Within Functional Limits for tasks assessed                                        General Comments      Exercises     Assessment/Plan    PT Assessment All further PT needs can be met in the next venue of care (HHPT)  PT Problem List Decreased strength;Decreased mobility;Decreased balance;Decreased activity tolerance;Decreased knowledge of use of DME;Pain  PT Treatment Interventions      PT Goals (Current goals can be found in the Care Plan section)  Acute Rehab PT Goals Patient Stated Goal: none stated PT Goal Formulation: All assessment and education complete, DC therapy Time For Goal Achievement: 11/25/16 Potential to Achieve Goals: Good    Frequency     Barriers to discharge        Co-evaluation               End of Session Equipment Utilized During Treatment: Gait belt Activity Tolerance: Patient limited by fatigue;Patient limited by pain Patient left: in bed (sitting at EOB)   PT Visit Diagnosis: Muscle weakness (generalized) (M62.81);Difficulty in walking, not  elsewhere classified (R26.2)    Time: 7308-1683 PT Time Calculation (min) (ACUTE ONLY): 9 min   Charges:   PT Evaluation $PT Eval Low Complexity: 1 Procedure     PT G Codes:   PT G-Codes **NOT FOR INPATIENT CLASS** Functional Assessment Tool Used: AM-PAC 6 Clicks Basic Mobility;Clinical judgement Functional Limitation: Mobility: Walking and moving around Mobility: Walking and Moving Around Current Status (A7065): At least 20 percent but less than 40 percent impaired, limited or restricted Mobility: Walking and Moving Around Goal Status [phone]): At least 20 percent but less than 40 percent impaired, limited or restricted Mobility: Walking and Moving Around Discharge Status [phone]): At least 20 percent but less than 40 percent impaired, limited or restricted      Weston Anna, MPT Pager: [phone]

## 2016-11-11 NOTE — Care Management Note (Signed)
Case Management Note  Patient Details  Name: [name] MRN: [id number] Date of Birth: Dec 12, 1940  Subjective/Objective:    CHF, HTN, Chronic venous stasis dermatitis of both lower extremities                Action/Plan: Discharge Planning: NCM spoke to pt and she was recently dc from [facility] after having [facility] [facility]. Explained to pt that [facility] cannot accept for [facility] due to contractual issues     Expected Discharge Date:  11/11/16               Expected Discharge Plan:  [place]  In-House Referral:  NA  Discharge planning Services  CM Consult  Post Acute Care Choice:  Home Health Choice offered to:  Patient  DME Arranged:  N/A DME Agency:  NA  HH Arranged:  RN, PT [place] Agency:  [facility] (formerly [employer])  Status of Service:  Completed, signed off  If discussed at [employer], dates discussed:    Additional Comments:  Erenest Rasher, RN 11/11/2016, 3:32 PM

## 2016-11-12 ENCOUNTER — Telehealth: Payer: Self-pay

## 2016-11-12 NOTE — Telephone Encounter (Signed)
Transition Care Management Follow-up Telephone Call   Date discharged? 11/11/2016   How have you been since you were released from the hospital? Pt is feeling better   Do you understand why you were in the hospital? Yes   Do you understand the discharge instructions? Yes  Where were you discharged to? Home  Items Reviewed:  Medications reviewed: Yes  Allergies reviewed: Yes  Dietary changes reviewed: Yes  Referrals reviewed: Yes   Functional Questionnaire:   Activities of Daily Living (ADLs):   States they are independent in the following: All ADL's States they require assistance with the following: n/a. Pt does walk with a cane at home   Any transportation issues/concerns?: not currently   Any patient concerns? Not currently   Confirmed importance and date/time of follow-up visits scheduled Pt did not want to make appt at this time.   Provider Appointment booked with APPT NOT MADE  Confirmed with patient if condition begins to worsen call PCP or go to the ER.  Patient was given the office number and encouraged to call back with question or concerns: YES

## 2016-11-13 DIAGNOSIS — I2781 Cor pulmonale (chronic): Secondary | ICD-10-CM | POA: Diagnosis not present

## 2016-11-13 DIAGNOSIS — K5150 Left sided colitis without complications: Secondary | ICD-10-CM | POA: Diagnosis not present

## 2016-11-13 DIAGNOSIS — I5033 Acute on chronic diastolic (congestive) heart failure: Secondary | ICD-10-CM | POA: Diagnosis not present

## 2016-11-13 DIAGNOSIS — D472 Monoclonal gammopathy: Secondary | ICD-10-CM | POA: Diagnosis not present

## 2016-11-13 DIAGNOSIS — J449 Chronic obstructive pulmonary disease, unspecified: Secondary | ICD-10-CM | POA: Diagnosis not present

## 2016-11-13 DIAGNOSIS — N183 Chronic kidney disease, stage 3 (moderate): Secondary | ICD-10-CM | POA: Diagnosis not present

## 2016-11-13 DIAGNOSIS — D122 Benign neoplasm of ascending colon: Secondary | ICD-10-CM | POA: Diagnosis not present

## 2016-11-13 DIAGNOSIS — E785 Hyperlipidemia, unspecified: Secondary | ICD-10-CM | POA: Diagnosis not present

## 2016-11-13 DIAGNOSIS — M109 Gout, unspecified: Secondary | ICD-10-CM | POA: Diagnosis not present

## 2016-11-13 DIAGNOSIS — D519 Vitamin B12 deficiency anemia, unspecified: Secondary | ICD-10-CM | POA: Diagnosis not present

## 2016-11-13 DIAGNOSIS — D509 Iron deficiency anemia, unspecified: Secondary | ICD-10-CM | POA: Diagnosis not present

## 2016-11-13 DIAGNOSIS — I130 Hypertensive heart and chronic kidney disease with heart failure and stage 1 through stage 4 chronic kidney disease, or unspecified chronic kidney disease: Secondary | ICD-10-CM | POA: Diagnosis not present

## 2016-11-13 LAB — CULTURE, BLOOD (ROUTINE X 2)
CULTURE: NO GROWTH
Culture: NO GROWTH
SPECIAL REQUESTS: ADEQUATE

## 2016-11-16 DIAGNOSIS — K5150 Left sided colitis without complications: Secondary | ICD-10-CM | POA: Diagnosis not present

## 2016-11-16 DIAGNOSIS — D122 Benign neoplasm of ascending colon: Secondary | ICD-10-CM | POA: Diagnosis not present

## 2016-11-16 DIAGNOSIS — D519 Vitamin B12 deficiency anemia, unspecified: Secondary | ICD-10-CM | POA: Diagnosis not present

## 2016-11-16 DIAGNOSIS — N183 Chronic kidney disease, stage 3 (moderate): Secondary | ICD-10-CM | POA: Diagnosis not present

## 2016-11-16 DIAGNOSIS — I130 Hypertensive heart and chronic kidney disease with heart failure and stage 1 through stage 4 chronic kidney disease, or unspecified chronic kidney disease: Secondary | ICD-10-CM | POA: Diagnosis not present

## 2016-11-16 DIAGNOSIS — E785 Hyperlipidemia, unspecified: Secondary | ICD-10-CM | POA: Diagnosis not present

## 2016-11-16 DIAGNOSIS — D509 Iron deficiency anemia, unspecified: Secondary | ICD-10-CM | POA: Diagnosis not present

## 2016-11-16 DIAGNOSIS — J449 Chronic obstructive pulmonary disease, unspecified: Secondary | ICD-10-CM | POA: Diagnosis not present

## 2016-11-16 DIAGNOSIS — I5033 Acute on chronic diastolic (congestive) heart failure: Secondary | ICD-10-CM | POA: Diagnosis not present

## 2016-11-16 DIAGNOSIS — M109 Gout, unspecified: Secondary | ICD-10-CM | POA: Diagnosis not present

## 2016-11-16 DIAGNOSIS — I2781 Cor pulmonale (chronic): Secondary | ICD-10-CM | POA: Diagnosis not present

## 2016-11-16 DIAGNOSIS — D472 Monoclonal gammopathy: Secondary | ICD-10-CM | POA: Diagnosis not present

## 2016-11-19 DIAGNOSIS — D122 Benign neoplasm of ascending colon: Secondary | ICD-10-CM | POA: Diagnosis not present

## 2016-11-19 DIAGNOSIS — D509 Iron deficiency anemia, unspecified: Secondary | ICD-10-CM | POA: Diagnosis not present

## 2016-11-19 DIAGNOSIS — D472 Monoclonal gammopathy: Secondary | ICD-10-CM | POA: Diagnosis not present

## 2016-11-19 DIAGNOSIS — I5033 Acute on chronic diastolic (congestive) heart failure: Secondary | ICD-10-CM | POA: Diagnosis not present

## 2016-11-19 DIAGNOSIS — I2781 Cor pulmonale (chronic): Secondary | ICD-10-CM | POA: Diagnosis not present

## 2016-11-19 DIAGNOSIS — N183 Chronic kidney disease, stage 3 (moderate): Secondary | ICD-10-CM | POA: Diagnosis not present

## 2016-11-19 DIAGNOSIS — K5150 Left sided colitis without complications: Secondary | ICD-10-CM | POA: Diagnosis not present

## 2016-11-19 DIAGNOSIS — D519 Vitamin B12 deficiency anemia, unspecified: Secondary | ICD-10-CM | POA: Diagnosis not present

## 2016-11-19 DIAGNOSIS — J449 Chronic obstructive pulmonary disease, unspecified: Secondary | ICD-10-CM | POA: Diagnosis not present

## 2016-11-19 DIAGNOSIS — M109 Gout, unspecified: Secondary | ICD-10-CM | POA: Diagnosis not present

## 2016-11-19 DIAGNOSIS — E785 Hyperlipidemia, unspecified: Secondary | ICD-10-CM | POA: Diagnosis not present

## 2016-11-19 DIAGNOSIS — I130 Hypertensive heart and chronic kidney disease with heart failure and stage 1 through stage 4 chronic kidney disease, or unspecified chronic kidney disease: Secondary | ICD-10-CM | POA: Diagnosis not present

## 2016-11-20 DIAGNOSIS — D122 Benign neoplasm of ascending colon: Secondary | ICD-10-CM | POA: Diagnosis not present

## 2016-11-20 DIAGNOSIS — E785 Hyperlipidemia, unspecified: Secondary | ICD-10-CM | POA: Diagnosis not present

## 2016-11-20 DIAGNOSIS — J449 Chronic obstructive pulmonary disease, unspecified: Secondary | ICD-10-CM | POA: Diagnosis not present

## 2016-11-20 DIAGNOSIS — I2781 Cor pulmonale (chronic): Secondary | ICD-10-CM | POA: Diagnosis not present

## 2016-11-20 DIAGNOSIS — N183 Chronic kidney disease, stage 3 (moderate): Secondary | ICD-10-CM | POA: Diagnosis not present

## 2016-11-20 DIAGNOSIS — D472 Monoclonal gammopathy: Secondary | ICD-10-CM | POA: Diagnosis not present

## 2016-11-20 DIAGNOSIS — D519 Vitamin B12 deficiency anemia, unspecified: Secondary | ICD-10-CM | POA: Diagnosis not present

## 2016-11-20 DIAGNOSIS — I130 Hypertensive heart and chronic kidney disease with heart failure and stage 1 through stage 4 chronic kidney disease, or unspecified chronic kidney disease: Secondary | ICD-10-CM | POA: Diagnosis not present

## 2016-11-20 DIAGNOSIS — D509 Iron deficiency anemia, unspecified: Secondary | ICD-10-CM | POA: Diagnosis not present

## 2016-11-20 DIAGNOSIS — M109 Gout, unspecified: Secondary | ICD-10-CM | POA: Diagnosis not present

## 2016-11-20 DIAGNOSIS — I5033 Acute on chronic diastolic (congestive) heart failure: Secondary | ICD-10-CM | POA: Diagnosis not present

## 2016-11-20 DIAGNOSIS — K5150 Left sided colitis without complications: Secondary | ICD-10-CM | POA: Diagnosis not present

## 2016-11-21 ENCOUNTER — Other Ambulatory Visit (INDEPENDENT_AMBULATORY_CARE_PROVIDER_SITE_OTHER): Payer: Medicare Other

## 2016-11-21 ENCOUNTER — Encounter: Payer: Self-pay | Admitting: Internal Medicine

## 2016-11-21 ENCOUNTER — Ambulatory Visit (INDEPENDENT_AMBULATORY_CARE_PROVIDER_SITE_OTHER): Payer: Medicare Other | Admitting: Internal Medicine

## 2016-11-21 ENCOUNTER — Ambulatory Visit (INDEPENDENT_AMBULATORY_CARE_PROVIDER_SITE_OTHER)
Admission: RE | Admit: 2016-11-21 | Discharge: 2016-11-21 | Disposition: A | Discharge status: Home / Self Care | Payer: Medicare Other | Source: Ambulatory Visit | Attending: Internal Medicine | Admitting: Internal Medicine

## 2016-11-21 VITALS — BP 158/70 | HR 57 | Temp 97.9°F | Ht 60.0 in | Wt 222.8 lb

## 2016-11-21 DIAGNOSIS — D539 Nutritional anemia, unspecified: Secondary | ICD-10-CM

## 2016-11-21 DIAGNOSIS — L03115 Cellulitis of right lower limb: Secondary | ICD-10-CM

## 2016-11-21 DIAGNOSIS — S4992XA Unspecified injury of left shoulder and upper arm, initial encounter: Secondary | ICD-10-CM

## 2016-11-21 DIAGNOSIS — L03116 Cellulitis of left lower limb: Secondary | ICD-10-CM

## 2016-11-21 DIAGNOSIS — N183 Chronic kidney disease, stage 3 unspecified: Secondary | ICD-10-CM

## 2016-11-21 DIAGNOSIS — D508 Other iron deficiency anemias: Secondary | ICD-10-CM | POA: Diagnosis not present

## 2016-11-21 DIAGNOSIS — I10 Essential (primary) hypertension: Secondary | ICD-10-CM | POA: Diagnosis not present

## 2016-11-21 LAB — CBC WITH DIFFERENTIAL/PLATELET
BASOS PCT: 1.2 % (ref 0.0–3.0)
Basophils Absolute: 0.1 10*3/uL (ref 0.0–0.1)
EOS ABS: 0.4 10*3/uL (ref 0.0–0.7)
EOS PCT: 3.7 % (ref 0.0–5.0)
Lymphocytes Relative: 20.5 % (ref 12.0–46.0)
Lymphs Abs: 2.2 10*3/uL (ref 0.7–4.0)
MCHC: 32.6 g/dL (ref 30.0–36.0)
MCV: 83.3 fl (ref 78.0–100.0)
MONOS PCT: 12.6 % — AB (ref 3.0–12.0)
Monocytes Absolute: 1.3 10*3/uL — ABNORMAL HIGH (ref 0.1–1.0)
NEUTROS ABS: 6.6 10*3/uL (ref 1.4–7.7)
Neutrophils Relative %: 62 % (ref 43.0–77.0)
PLATELETS: 334 10*3/uL (ref 150.0–400.0)
RBC: 3.18 Mil/uL — ABNORMAL LOW (ref 3.87–5.11)
RDW: 16.2 % — AB (ref 11.5–15.5)
WBC: 10.7 10*3/uL — AB (ref 4.0–10.5)

## 2016-11-21 LAB — FERRITIN: FERRITIN: 37.8 ng/mL (ref 10.0–291.0)

## 2016-11-21 LAB — IBC PANEL
Iron: 97 ug/dL (ref 42–145)
Saturation Ratios: 27.2 % (ref 20.0–50.0)
Transferrin: 255 mg/dL (ref 212.0–360.0)

## 2016-11-21 LAB — RETICULOCYTES
ABS Retic: 113750 cells/uL — ABNORMAL HIGH (ref 20000–80000)
RBC.: 3.25 MIL/uL — AB (ref 3.80–5.10)
RETIC CT PCT: 3.5 %

## 2016-11-21 NOTE — Progress Notes (Signed)
Pre visit review using our clinic review tool, if applicable. No additional management support is needed unless otherwise documented below in the visit note. 

## 2016-11-21 NOTE — Progress Notes (Signed)
Subjective:  Patient ID: [name], female    DOB: 15-Nov-1940  Age: 76 y.o. MRN: [id number]  CC: Anemia and Shoulder Injury   HPI [name] presents for f/up - She was recently admitted for lower extremity edema, concern for cellulitis, venous insufficiency with ulceration. She tells me that home health care is coming to her house every 2-3 days and changing the dressings on her lower extremities. She has lower extremity dressings on today that were placed one day prior to this visit and she is not willing to have them removed today for me to examine the skin underneath.  She complains that she fell 2 days ago and injured her left shoulder. She has shoulder pain and decreased range of motion. She has not noticed any bruising or swelling and she is getting adequate relief from her pain with Tylenol.  She is also due for follow-up on anemia. She has rare episodes of frank blood in her stools but has had no recent abdominal pain, diarrhea, or paresthesias.  Outpatient Medications Prior to Visit  Medication Sig Dispense Refill  . acetaminophen (TYLENOL) 325 MG tablet Take 2 tablets (650 mg total) by mouth every 6 (six) hours as needed for mild pain or headache (or Fever >/= 101).    Marland Kitchen albuterol (PROAIR HFA) 108 (90 Base) MCG/ACT inhaler Inhale 1-2 puffs into the lungs every 6 (six) hours as needed for wheezing or shortness of breath. 18 g 11  . albuterol (PROVENTIL) (2.5 MG/3ML) 0.083% nebulizer solution Take 3 mLs (2.5 mg total) by nebulization every 6 (six) hours as needed for wheezing or shortness of breath. 150 mL 11  . allopurinol (ZYLOPRIM) 100 MG tablet TAKE 1 TABLET BY MOUTH EVERY DAY FOR GOUT 90 tablet 3  . aspirin 81 MG EC tablet Take 81 mg by mouth daily.      Marland Kitchen BREO ELLIPTA 100-25 MCG/INH AEPB INHALE 1 PUFF INTO THE LUNGS DAILY 60 each 11  . cyanocobalamin 2000 MCG tablet Take 1 tablet (2,000 mcg total) by mouth daily. (Patient taking differently: Take 2,000 mcg by mouth  every evening. ) 90 tablet 3  . Dietary Management Product (VASCULERA) TABS Take 1 tablet by mouth daily. 30 tablet 11  . ferrous sulfate 325 (65 FE) MG tablet Take 1 tablet (325 mg total) by mouth 2 (two) times daily with a meal. 60 tablet 11  . furosemide (LASIX) 40 MG tablet TAKE 1 TABLET BY MOUTH TWICE DAILY 180 tablet 0  . hydrocerin (EUCERIN) CREA Apply 1 application topically as directed. 454 g 0  . metoprolol (LOPRESSOR) 50 MG tablet TAKE 1 TABLET BY MOUTH TWICE DAILY 180 tablet 1  . nystatin (MYCOSTATIN/NYSTOP) powder Apply 1 Bottle topically 2 (two) times daily as needed (skin irritation.). Use as directed twice per day as needed 1 Bottle 0  . promethazine-dextromethorphan (PROMETHAZINE-DM) 6.25-15 MG/5ML syrup Take 5 mLs by mouth 4 (four) times daily as needed for cough. 118 mL 0  . triamcinolone cream (KENALOG) 0.5 % Apply 1 application topically 3 (three) times daily. 100 g 1  . Vitamin D, Ergocalciferol, (DRISDOL) 50000 units CAPS capsule TAKE ONE CAPSULE BY MOUTH ONCE WEEKLY 12 capsule 3  . cefpodoxime (VANTIN) 100 MG tablet Take 4 tablets (400 mg total) by mouth daily. 7 tablet 0   No facility-administered medications prior to visit.     ROS Review of Systems  Constitutional: Positive for fatigue. Negative for appetite change, chills, diaphoresis and unexpected weight change.  HENT: Negative.  Eyes: Negative.   Respiratory: Negative for cough, chest tightness, shortness of breath and wheezing.   Cardiovascular: Negative for chest pain, palpitations and leg swelling.  Gastrointestinal: Positive for blood in stool. Negative for abdominal pain, anal bleeding, constipation, diarrhea, nausea and vomiting.  Endocrine: Negative.   Genitourinary: Negative.  Negative for difficulty urinating.  Musculoskeletal: Positive for arthralgias. Negative for back pain and neck pain.  Skin: Negative.   Allergic/Immunologic: Negative.   Neurological: Negative.  Negative for dizziness,  weakness, numbness and headaches.  Hematological: Negative.  Negative for adenopathy. Does not bruise/bleed easily.  Psychiatric/Behavioral: Negative.     Objective:  BP (!) 158/70 (BP Location: Right Arm, Patient Position: Sitting, Cuff Size: Large)   Pulse (!) 57   Temp 97.9 F (36.6 C) (Oral)   Ht 5' (1.524 m)   Wt 222 lb 12 oz (101 kg)   SpO2 99%   BMI 43.50 kg/m   BP Readings from Last 3 Encounters:  11/21/16 (!) 158/70  11/11/16 (!) 131/45  11/08/16 (!) 140/58    Wt Readings from Last 3 Encounters:  11/21/16 222 lb 12 oz (101 kg)  11/11/16 233 lb 0.4 oz (105.7 kg)  11/08/16 231 lb 1.3 oz (104.8 kg)    Physical Exam  Constitutional: She is oriented to person, place, and time. No distress.  HENT:  Mouth/Throat: Oropharynx is clear and moist. No oropharyngeal exudate.  Eyes: Conjunctivae are normal. Right eye exhibits no discharge. Left eye exhibits no discharge. No scleral icterus.  Neck: Normal range of motion. Neck supple. No JVD present. No tracheal deviation present. No thyromegaly present.  Cardiovascular: Normal rate, regular rhythm, normal heart sounds and intact distal pulses.  Exam reveals no gallop and no friction rub.   No murmur heard. Pulmonary/Chest: Effort normal and breath sounds normal. No stridor. No respiratory distress. She has no wheezes. She has no rales. She exhibits no tenderness.  Abdominal: Soft. Bowel sounds are normal. She exhibits no distension and no mass. There is no tenderness. There is no rebound and no guarding.  Musculoskeletal: She exhibits no edema or deformity.       Left shoulder: She exhibits decreased range of motion and tenderness. She exhibits no bony tenderness, no swelling, no effusion, no crepitus, no deformity, no laceration, no pain and no spasm.  There are UNNA boot dressings over BLE  Lymphadenopathy:    She has no cervical adenopathy.  Neurological: She is oriented to person, place, and time.  Skin: Skin is warm and  dry. No rash noted. She is not diaphoretic. No erythema. No pallor.  Vitals reviewed.   Lab Results  Component Value Date   WBC 10.7 (H) 11/21/2016   HGB 8.6 Repeated and verified X2. (L) 11/21/2016   HCT 26.4 Repeated and verified X2. (L) 11/21/2016   PLT 334.0 11/21/2016   GLUCOSE 102 (H) 11/11/2016   CHOL 175 09/04/2016   TRIG 215.0 (H) 09/04/2016   HDL 46.10 09/04/2016   LDLDIRECT 107.0 09/04/2016   LDLCALC 93 08/17/2015   ALT 13 (L) 11/11/2016   AST 16 11/11/2016   NA 137 11/11/2016   K 4.2 11/11/2016   CL 100 (L) 11/11/2016   CREATININE 1.89 (H) 11/11/2016   BUN 28 (H) 11/11/2016   CO2 31 11/11/2016   TSH 2.466 03/20/2016   HGBA1C 5.8 09/04/2016   MICROALBUR 15.9 (H) 02/05/2013    Dg Chest 2 View  Result Date: 11/08/2016 CLINICAL DATA:  Cellulitis of both lower extremities. History of CHF and  COPD. Hypertension. EXAM: CHEST  2 VIEW COMPARISON:  10/30/2016 CXR, CT abdomen and pelvis from 03/19/2016 FINDINGS: Chronic mild hyperinflation of the lungs. No pneumonic consolidation or CHF. No pneumothorax. Heart is top-normal in size with with soft tissue density along the right heart border consistent with an epicardial fat pad by prior CT. Aortic atherosclerosis at the arch without aneurysm. No pneumonic consolidation is seen. There is slight blunting of the left costophrenic angle likely secondary to epicardial fat as well. Chronic degenerative changes are seen along the dorsal spine. No acute nor suspicious osseous abnormality. IMPRESSION: No active mild hyperinflation of the lungs. No acute pulmonary disease. Electronically Signed   By: Ashley Royalty M.D.   On: 11/08/2016 18:39   Dg Shoulder Left  Result Date: 11/21/2016 CLINICAL DATA:  Fall yesterday. Left shoulder injury. Unable to abduct arm. EXAM: LEFT SHOULDER - 2+ VIEW COMPARISON:  None. FINDINGS: There is no evidence of fracture or dislocation. There is no evidence of arthropathy or other focal bone abnormality. Soft  tissues are unremarkable. IMPRESSION: Negative left shoulder radiographs. Electronically Signed   By: San Morelle M.D.   On: 11/21/2016 15:21    Assessment & Plan:   Gatha was seen today for anemia and shoulder injury.  Diagnoses and all orders for this visit:  Deficiency anemia- her H&H have improved and her vitamin levels are normal, the anemia appears to be consistent with the anemia of chronic disease and no treatment is indicated at this time. -     CBC with Differential/Platelet; Future -     Vitamin B1; Future -     IBC panel; Future -     Ferritin; Future -     Reticulocytes; Future  Essential hypertension- her blood pressure is adequately well controlled.  CKD (chronic kidney disease), stage III  Other iron deficiency anemia  Injury of left shoulder, initial encounter- plain film is negative for fracture, her symptoms are consistent with contusion, I've asked her to continue working on her range of motion and continue Tylenol as needed for pain. -     DG Shoulder Left; Future   I have discontinued Ms. Valley's cefpodoxime. I am also having her maintain her aspirin, acetaminophen, VASCULERA, cyanocobalamin, ferrous sulfate, triamcinolone cream, nystatin, allopurinol, BREO ELLIPTA, metoprolol, Vitamin D (Ergocalciferol), furosemide, albuterol, albuterol, promethazine-dextromethorphan, and hydrocerin.  No orders of the defined types were placed in this encounter.    Follow-up: Return if symptoms worsen or fail to improve.  [name], MD

## 2016-11-21 NOTE — Patient Instructions (Signed)
Shoulder Pain Many things can cause shoulder pain, including:  An injury to the area.  Overuse of the shoulder.  Arthritis. The source of the pain can be:  Inflammation.  An injury to the shoulder joint.  An injury to a tendon, ligament, or bone. Follow these instructions at home: Take these actions to help with your pain:  Squeeze a soft ball or a foam pad as much as possible. This helps to keep the shoulder from swelling. It also helps to strengthen the arm.  Take over-the-counter and prescription medicines only as told by your health care provider.  If directed, apply ice to the area:  Put ice in a plastic bag.  Place a towel between your skin and the bag.  Leave the ice on for 20 minutes, 2-3 times per day. Stop applying ice if it does not help with the pain.  If you were given a shoulder sling or immobilizer:  Wear it as told.  Remove it to shower or bathe.  Move your arm as little as possible, but keep your hand moving to prevent swelling. Contact a health care provider if:  Your pain gets worse.  Your pain is not relieved with medicines.  New pain develops in your arm, hand, or fingers. Get help right away if:  Your arm, hand, or fingers:  Tingle.  Become numb.  Become swollen.  Become painful.  Turn white or blue. This information is not intended to replace advice given to you by your health care provider. Make sure you discuss any questions you have with your health care provider. Document Released: 04/25/2005 Document Revised: 03/11/2016 Document Reviewed: 11/08/2014 Elsevier Interactive Patient Education  2017 [employer].

## 2016-11-22 DIAGNOSIS — J449 Chronic obstructive pulmonary disease, unspecified: Secondary | ICD-10-CM | POA: Diagnosis not present

## 2016-11-22 DIAGNOSIS — D519 Vitamin B12 deficiency anemia, unspecified: Secondary | ICD-10-CM | POA: Diagnosis not present

## 2016-11-22 DIAGNOSIS — M109 Gout, unspecified: Secondary | ICD-10-CM | POA: Diagnosis not present

## 2016-11-22 DIAGNOSIS — E785 Hyperlipidemia, unspecified: Secondary | ICD-10-CM | POA: Diagnosis not present

## 2016-11-22 DIAGNOSIS — D122 Benign neoplasm of ascending colon: Secondary | ICD-10-CM | POA: Diagnosis not present

## 2016-11-22 DIAGNOSIS — I130 Hypertensive heart and chronic kidney disease with heart failure and stage 1 through stage 4 chronic kidney disease, or unspecified chronic kidney disease: Secondary | ICD-10-CM | POA: Diagnosis not present

## 2016-11-22 DIAGNOSIS — N183 Chronic kidney disease, stage 3 (moderate): Secondary | ICD-10-CM | POA: Diagnosis not present

## 2016-11-22 DIAGNOSIS — I5033 Acute on chronic diastolic (congestive) heart failure: Secondary | ICD-10-CM | POA: Diagnosis not present

## 2016-11-22 DIAGNOSIS — D472 Monoclonal gammopathy: Secondary | ICD-10-CM | POA: Diagnosis not present

## 2016-11-22 DIAGNOSIS — I2781 Cor pulmonale (chronic): Secondary | ICD-10-CM | POA: Diagnosis not present

## 2016-11-22 DIAGNOSIS — K5150 Left sided colitis without complications: Secondary | ICD-10-CM | POA: Diagnosis not present

## 2016-11-22 DIAGNOSIS — D509 Iron deficiency anemia, unspecified: Secondary | ICD-10-CM | POA: Diagnosis not present

## 2016-11-22 NOTE — Assessment & Plan Note (Signed)
Cont UNNA boot dressings over the [employer]

## 2016-11-23 DIAGNOSIS — D122 Benign neoplasm of ascending colon: Secondary | ICD-10-CM | POA: Diagnosis not present

## 2016-11-23 DIAGNOSIS — N183 Chronic kidney disease, stage 3 (moderate): Secondary | ICD-10-CM | POA: Diagnosis not present

## 2016-11-23 DIAGNOSIS — I130 Hypertensive heart and chronic kidney disease with heart failure and stage 1 through stage 4 chronic kidney disease, or unspecified chronic kidney disease: Secondary | ICD-10-CM | POA: Diagnosis not present

## 2016-11-23 DIAGNOSIS — D519 Vitamin B12 deficiency anemia, unspecified: Secondary | ICD-10-CM | POA: Diagnosis not present

## 2016-11-23 DIAGNOSIS — E785 Hyperlipidemia, unspecified: Secondary | ICD-10-CM | POA: Diagnosis not present

## 2016-11-23 DIAGNOSIS — M109 Gout, unspecified: Secondary | ICD-10-CM | POA: Diagnosis not present

## 2016-11-23 DIAGNOSIS — J449 Chronic obstructive pulmonary disease, unspecified: Secondary | ICD-10-CM | POA: Diagnosis not present

## 2016-11-23 DIAGNOSIS — D509 Iron deficiency anemia, unspecified: Secondary | ICD-10-CM | POA: Diagnosis not present

## 2016-11-23 DIAGNOSIS — I2781 Cor pulmonale (chronic): Secondary | ICD-10-CM | POA: Diagnosis not present

## 2016-11-23 DIAGNOSIS — D472 Monoclonal gammopathy: Secondary | ICD-10-CM | POA: Diagnosis not present

## 2016-11-23 DIAGNOSIS — I5033 Acute on chronic diastolic (congestive) heart failure: Secondary | ICD-10-CM | POA: Diagnosis not present

## 2016-11-23 DIAGNOSIS — K5150 Left sided colitis without complications: Secondary | ICD-10-CM | POA: Diagnosis not present

## 2016-11-25 LAB — VITAMIN B1: Vitamin B1 (Thiamine): 9 nmol/L (ref 8–30)

## 2016-11-26 DIAGNOSIS — I130 Hypertensive heart and chronic kidney disease with heart failure and stage 1 through stage 4 chronic kidney disease, or unspecified chronic kidney disease: Secondary | ICD-10-CM | POA: Diagnosis not present

## 2016-11-26 DIAGNOSIS — K5150 Left sided colitis without complications: Secondary | ICD-10-CM | POA: Diagnosis not present

## 2016-11-26 DIAGNOSIS — J449 Chronic obstructive pulmonary disease, unspecified: Secondary | ICD-10-CM | POA: Diagnosis not present

## 2016-11-26 DIAGNOSIS — I5033 Acute on chronic diastolic (congestive) heart failure: Secondary | ICD-10-CM | POA: Diagnosis not present

## 2016-11-26 DIAGNOSIS — E785 Hyperlipidemia, unspecified: Secondary | ICD-10-CM | POA: Diagnosis not present

## 2016-11-26 DIAGNOSIS — D122 Benign neoplasm of ascending colon: Secondary | ICD-10-CM | POA: Diagnosis not present

## 2016-11-26 DIAGNOSIS — M109 Gout, unspecified: Secondary | ICD-10-CM | POA: Diagnosis not present

## 2016-11-26 DIAGNOSIS — I2781 Cor pulmonale (chronic): Secondary | ICD-10-CM | POA: Diagnosis not present

## 2016-11-26 DIAGNOSIS — N183 Chronic kidney disease, stage 3 (moderate): Secondary | ICD-10-CM | POA: Diagnosis not present

## 2016-11-26 DIAGNOSIS — D509 Iron deficiency anemia, unspecified: Secondary | ICD-10-CM | POA: Diagnosis not present

## 2016-11-26 DIAGNOSIS — D519 Vitamin B12 deficiency anemia, unspecified: Secondary | ICD-10-CM | POA: Diagnosis not present

## 2016-11-26 DIAGNOSIS — D472 Monoclonal gammopathy: Secondary | ICD-10-CM | POA: Diagnosis not present

## 2016-11-27 DIAGNOSIS — N183 Chronic kidney disease, stage 3 (moderate): Secondary | ICD-10-CM | POA: Diagnosis not present

## 2016-11-27 DIAGNOSIS — I130 Hypertensive heart and chronic kidney disease with heart failure and stage 1 through stage 4 chronic kidney disease, or unspecified chronic kidney disease: Secondary | ICD-10-CM | POA: Diagnosis not present

## 2016-11-27 DIAGNOSIS — I2781 Cor pulmonale (chronic): Secondary | ICD-10-CM | POA: Diagnosis not present

## 2016-11-27 DIAGNOSIS — M109 Gout, unspecified: Secondary | ICD-10-CM | POA: Diagnosis not present

## 2016-11-27 DIAGNOSIS — D122 Benign neoplasm of ascending colon: Secondary | ICD-10-CM | POA: Diagnosis not present

## 2016-11-27 DIAGNOSIS — J449 Chronic obstructive pulmonary disease, unspecified: Secondary | ICD-10-CM | POA: Diagnosis not present

## 2016-11-27 DIAGNOSIS — D472 Monoclonal gammopathy: Secondary | ICD-10-CM | POA: Diagnosis not present

## 2016-11-27 DIAGNOSIS — K5150 Left sided colitis without complications: Secondary | ICD-10-CM | POA: Diagnosis not present

## 2016-11-27 DIAGNOSIS — D519 Vitamin B12 deficiency anemia, unspecified: Secondary | ICD-10-CM | POA: Diagnosis not present

## 2016-11-27 DIAGNOSIS — I5033 Acute on chronic diastolic (congestive) heart failure: Secondary | ICD-10-CM | POA: Diagnosis not present

## 2016-11-27 DIAGNOSIS — D509 Iron deficiency anemia, unspecified: Secondary | ICD-10-CM | POA: Diagnosis not present

## 2016-11-27 DIAGNOSIS — E785 Hyperlipidemia, unspecified: Secondary | ICD-10-CM | POA: Diagnosis not present

## 2016-11-29 DIAGNOSIS — I2781 Cor pulmonale (chronic): Secondary | ICD-10-CM | POA: Diagnosis not present

## 2016-11-29 DIAGNOSIS — D122 Benign neoplasm of ascending colon: Secondary | ICD-10-CM | POA: Diagnosis not present

## 2016-11-29 DIAGNOSIS — J449 Chronic obstructive pulmonary disease, unspecified: Secondary | ICD-10-CM | POA: Diagnosis not present

## 2016-11-29 DIAGNOSIS — D519 Vitamin B12 deficiency anemia, unspecified: Secondary | ICD-10-CM | POA: Diagnosis not present

## 2016-11-29 DIAGNOSIS — I130 Hypertensive heart and chronic kidney disease with heart failure and stage 1 through stage 4 chronic kidney disease, or unspecified chronic kidney disease: Secondary | ICD-10-CM | POA: Diagnosis not present

## 2016-11-29 DIAGNOSIS — D509 Iron deficiency anemia, unspecified: Secondary | ICD-10-CM | POA: Diagnosis not present

## 2016-11-29 DIAGNOSIS — D472 Monoclonal gammopathy: Secondary | ICD-10-CM | POA: Diagnosis not present

## 2016-11-29 DIAGNOSIS — M109 Gout, unspecified: Secondary | ICD-10-CM | POA: Diagnosis not present

## 2016-11-29 DIAGNOSIS — N183 Chronic kidney disease, stage 3 (moderate): Secondary | ICD-10-CM | POA: Diagnosis not present

## 2016-11-29 DIAGNOSIS — K5150 Left sided colitis without complications: Secondary | ICD-10-CM | POA: Diagnosis not present

## 2016-11-29 DIAGNOSIS — I5033 Acute on chronic diastolic (congestive) heart failure: Secondary | ICD-10-CM | POA: Diagnosis not present

## 2016-11-29 DIAGNOSIS — E785 Hyperlipidemia, unspecified: Secondary | ICD-10-CM | POA: Diagnosis not present

## 2016-11-30 DIAGNOSIS — E785 Hyperlipidemia, unspecified: Secondary | ICD-10-CM | POA: Diagnosis not present

## 2016-11-30 DIAGNOSIS — D122 Benign neoplasm of ascending colon: Secondary | ICD-10-CM | POA: Diagnosis not present

## 2016-11-30 DIAGNOSIS — D519 Vitamin B12 deficiency anemia, unspecified: Secondary | ICD-10-CM | POA: Diagnosis not present

## 2016-11-30 DIAGNOSIS — J449 Chronic obstructive pulmonary disease, unspecified: Secondary | ICD-10-CM | POA: Diagnosis not present

## 2016-11-30 DIAGNOSIS — D472 Monoclonal gammopathy: Secondary | ICD-10-CM | POA: Diagnosis not present

## 2016-11-30 DIAGNOSIS — D509 Iron deficiency anemia, unspecified: Secondary | ICD-10-CM | POA: Diagnosis not present

## 2016-11-30 DIAGNOSIS — I2781 Cor pulmonale (chronic): Secondary | ICD-10-CM | POA: Diagnosis not present

## 2016-11-30 DIAGNOSIS — M109 Gout, unspecified: Secondary | ICD-10-CM | POA: Diagnosis not present

## 2016-11-30 DIAGNOSIS — I5033 Acute on chronic diastolic (congestive) heart failure: Secondary | ICD-10-CM | POA: Diagnosis not present

## 2016-11-30 DIAGNOSIS — I130 Hypertensive heart and chronic kidney disease with heart failure and stage 1 through stage 4 chronic kidney disease, or unspecified chronic kidney disease: Secondary | ICD-10-CM | POA: Diagnosis not present

## 2016-11-30 DIAGNOSIS — K5150 Left sided colitis without complications: Secondary | ICD-10-CM | POA: Diagnosis not present

## 2016-11-30 DIAGNOSIS — N183 Chronic kidney disease, stage 3 (moderate): Secondary | ICD-10-CM | POA: Diagnosis not present

## 2016-12-04 DIAGNOSIS — J449 Chronic obstructive pulmonary disease, unspecified: Secondary | ICD-10-CM | POA: Diagnosis not present

## 2016-12-04 DIAGNOSIS — E785 Hyperlipidemia, unspecified: Secondary | ICD-10-CM | POA: Diagnosis not present

## 2016-12-04 DIAGNOSIS — D509 Iron deficiency anemia, unspecified: Secondary | ICD-10-CM | POA: Diagnosis not present

## 2016-12-04 DIAGNOSIS — I5033 Acute on chronic diastolic (congestive) heart failure: Secondary | ICD-10-CM | POA: Diagnosis not present

## 2016-12-04 DIAGNOSIS — M109 Gout, unspecified: Secondary | ICD-10-CM | POA: Diagnosis not present

## 2016-12-04 DIAGNOSIS — D122 Benign neoplasm of ascending colon: Secondary | ICD-10-CM | POA: Diagnosis not present

## 2016-12-04 DIAGNOSIS — K5150 Left sided colitis without complications: Secondary | ICD-10-CM | POA: Diagnosis not present

## 2016-12-04 DIAGNOSIS — I130 Hypertensive heart and chronic kidney disease with heart failure and stage 1 through stage 4 chronic kidney disease, or unspecified chronic kidney disease: Secondary | ICD-10-CM | POA: Diagnosis not present

## 2016-12-04 DIAGNOSIS — I2781 Cor pulmonale (chronic): Secondary | ICD-10-CM | POA: Diagnosis not present

## 2016-12-04 DIAGNOSIS — N183 Chronic kidney disease, stage 3 (moderate): Secondary | ICD-10-CM | POA: Diagnosis not present

## 2016-12-04 DIAGNOSIS — D472 Monoclonal gammopathy: Secondary | ICD-10-CM | POA: Diagnosis not present

## 2016-12-04 DIAGNOSIS — D519 Vitamin B12 deficiency anemia, unspecified: Secondary | ICD-10-CM | POA: Diagnosis not present

## 2016-12-06 DIAGNOSIS — J449 Chronic obstructive pulmonary disease, unspecified: Secondary | ICD-10-CM | POA: Diagnosis not present

## 2016-12-06 DIAGNOSIS — I130 Hypertensive heart and chronic kidney disease with heart failure and stage 1 through stage 4 chronic kidney disease, or unspecified chronic kidney disease: Secondary | ICD-10-CM | POA: Diagnosis not present

## 2016-12-06 DIAGNOSIS — I2781 Cor pulmonale (chronic): Secondary | ICD-10-CM | POA: Diagnosis not present

## 2016-12-06 DIAGNOSIS — N183 Chronic kidney disease, stage 3 (moderate): Secondary | ICD-10-CM | POA: Diagnosis not present

## 2016-12-06 DIAGNOSIS — K5150 Left sided colitis without complications: Secondary | ICD-10-CM | POA: Diagnosis not present

## 2016-12-06 DIAGNOSIS — D472 Monoclonal gammopathy: Secondary | ICD-10-CM | POA: Diagnosis not present

## 2016-12-06 DIAGNOSIS — E785 Hyperlipidemia, unspecified: Secondary | ICD-10-CM | POA: Diagnosis not present

## 2016-12-06 DIAGNOSIS — D509 Iron deficiency anemia, unspecified: Secondary | ICD-10-CM | POA: Diagnosis not present

## 2016-12-06 DIAGNOSIS — D122 Benign neoplasm of ascending colon: Secondary | ICD-10-CM | POA: Diagnosis not present

## 2016-12-06 DIAGNOSIS — M109 Gout, unspecified: Secondary | ICD-10-CM | POA: Diagnosis not present

## 2016-12-06 DIAGNOSIS — I5033 Acute on chronic diastolic (congestive) heart failure: Secondary | ICD-10-CM | POA: Diagnosis not present

## 2016-12-06 DIAGNOSIS — D519 Vitamin B12 deficiency anemia, unspecified: Secondary | ICD-10-CM | POA: Diagnosis not present

## 2016-12-07 DIAGNOSIS — J449 Chronic obstructive pulmonary disease, unspecified: Secondary | ICD-10-CM | POA: Diagnosis not present

## 2016-12-07 DIAGNOSIS — N183 Chronic kidney disease, stage 3 (moderate): Secondary | ICD-10-CM | POA: Diagnosis not present

## 2016-12-07 DIAGNOSIS — E785 Hyperlipidemia, unspecified: Secondary | ICD-10-CM | POA: Diagnosis not present

## 2016-12-07 DIAGNOSIS — I130 Hypertensive heart and chronic kidney disease with heart failure and stage 1 through stage 4 chronic kidney disease, or unspecified chronic kidney disease: Secondary | ICD-10-CM | POA: Diagnosis not present

## 2016-12-07 DIAGNOSIS — D509 Iron deficiency anemia, unspecified: Secondary | ICD-10-CM | POA: Diagnosis not present

## 2016-12-07 DIAGNOSIS — D472 Monoclonal gammopathy: Secondary | ICD-10-CM | POA: Diagnosis not present

## 2016-12-07 DIAGNOSIS — K5150 Left sided colitis without complications: Secondary | ICD-10-CM | POA: Diagnosis not present

## 2016-12-07 DIAGNOSIS — I2781 Cor pulmonale (chronic): Secondary | ICD-10-CM | POA: Diagnosis not present

## 2016-12-07 DIAGNOSIS — I5033 Acute on chronic diastolic (congestive) heart failure: Secondary | ICD-10-CM | POA: Diagnosis not present

## 2016-12-07 DIAGNOSIS — M109 Gout, unspecified: Secondary | ICD-10-CM | POA: Diagnosis not present

## 2016-12-07 DIAGNOSIS — D519 Vitamin B12 deficiency anemia, unspecified: Secondary | ICD-10-CM | POA: Diagnosis not present

## 2016-12-07 DIAGNOSIS — D122 Benign neoplasm of ascending colon: Secondary | ICD-10-CM | POA: Diagnosis not present

## 2016-12-10 DIAGNOSIS — D122 Benign neoplasm of ascending colon: Secondary | ICD-10-CM | POA: Diagnosis not present

## 2016-12-10 DIAGNOSIS — K5150 Left sided colitis without complications: Secondary | ICD-10-CM | POA: Diagnosis not present

## 2016-12-10 DIAGNOSIS — E785 Hyperlipidemia, unspecified: Secondary | ICD-10-CM | POA: Diagnosis not present

## 2016-12-10 DIAGNOSIS — I2781 Cor pulmonale (chronic): Secondary | ICD-10-CM | POA: Diagnosis not present

## 2016-12-10 DIAGNOSIS — J449 Chronic obstructive pulmonary disease, unspecified: Secondary | ICD-10-CM | POA: Diagnosis not present

## 2016-12-10 DIAGNOSIS — N183 Chronic kidney disease, stage 3 (moderate): Secondary | ICD-10-CM | POA: Diagnosis not present

## 2016-12-10 DIAGNOSIS — D519 Vitamin B12 deficiency anemia, unspecified: Secondary | ICD-10-CM | POA: Diagnosis not present

## 2016-12-10 DIAGNOSIS — I5033 Acute on chronic diastolic (congestive) heart failure: Secondary | ICD-10-CM | POA: Diagnosis not present

## 2016-12-10 DIAGNOSIS — M109 Gout, unspecified: Secondary | ICD-10-CM | POA: Diagnosis not present

## 2016-12-10 DIAGNOSIS — D472 Monoclonal gammopathy: Secondary | ICD-10-CM | POA: Diagnosis not present

## 2016-12-10 DIAGNOSIS — D509 Iron deficiency anemia, unspecified: Secondary | ICD-10-CM | POA: Diagnosis not present

## 2016-12-10 DIAGNOSIS — I130 Hypertensive heart and chronic kidney disease with heart failure and stage 1 through stage 4 chronic kidney disease, or unspecified chronic kidney disease: Secondary | ICD-10-CM | POA: Diagnosis not present

## 2016-12-11 ENCOUNTER — Telehealth: Payer: Self-pay | Admitting: Internal Medicine

## 2016-12-11 DIAGNOSIS — I5033 Acute on chronic diastolic (congestive) heart failure: Secondary | ICD-10-CM | POA: Diagnosis not present

## 2016-12-11 DIAGNOSIS — K5150 Left sided colitis without complications: Secondary | ICD-10-CM | POA: Diagnosis not present

## 2016-12-11 DIAGNOSIS — E785 Hyperlipidemia, unspecified: Secondary | ICD-10-CM | POA: Diagnosis not present

## 2016-12-11 DIAGNOSIS — I2781 Cor pulmonale (chronic): Secondary | ICD-10-CM | POA: Diagnosis not present

## 2016-12-11 DIAGNOSIS — J449 Chronic obstructive pulmonary disease, unspecified: Secondary | ICD-10-CM | POA: Diagnosis not present

## 2016-12-11 DIAGNOSIS — D509 Iron deficiency anemia, unspecified: Secondary | ICD-10-CM | POA: Diagnosis not present

## 2016-12-11 DIAGNOSIS — D519 Vitamin B12 deficiency anemia, unspecified: Secondary | ICD-10-CM | POA: Diagnosis not present

## 2016-12-11 DIAGNOSIS — D472 Monoclonal gammopathy: Secondary | ICD-10-CM | POA: Diagnosis not present

## 2016-12-11 DIAGNOSIS — N183 Chronic kidney disease, stage 3 (moderate): Secondary | ICD-10-CM | POA: Diagnosis not present

## 2016-12-11 DIAGNOSIS — D122 Benign neoplasm of ascending colon: Secondary | ICD-10-CM | POA: Diagnosis not present

## 2016-12-11 DIAGNOSIS — M109 Gout, unspecified: Secondary | ICD-10-CM | POA: Diagnosis not present

## 2016-12-11 DIAGNOSIS — I130 Hypertensive heart and chronic kidney disease with heart failure and stage 1 through stage 4 chronic kidney disease, or unspecified chronic kidney disease: Secondary | ICD-10-CM | POA: Diagnosis not present

## 2016-12-11 NOTE — Telephone Encounter (Signed)
Needs verbals to continue skilled nursing 2x2   Would like to know how long Dr Ronnald Ramp would like them to do compression wraps when the Pt does not have a wound.

## 2016-12-11 NOTE — Telephone Encounter (Signed)
Barbette Or and gave verbal okay for SN 2 x wk for 2 weeks.   SN will stop the compression wrap at the beginning of next and observe need for restarting.

## 2016-12-13 DIAGNOSIS — J449 Chronic obstructive pulmonary disease, unspecified: Secondary | ICD-10-CM | POA: Diagnosis not present

## 2016-12-13 DIAGNOSIS — I5033 Acute on chronic diastolic (congestive) heart failure: Secondary | ICD-10-CM | POA: Diagnosis not present

## 2016-12-13 DIAGNOSIS — M109 Gout, unspecified: Secondary | ICD-10-CM | POA: Diagnosis not present

## 2016-12-13 DIAGNOSIS — D472 Monoclonal gammopathy: Secondary | ICD-10-CM | POA: Diagnosis not present

## 2016-12-13 DIAGNOSIS — D509 Iron deficiency anemia, unspecified: Secondary | ICD-10-CM | POA: Diagnosis not present

## 2016-12-13 DIAGNOSIS — E785 Hyperlipidemia, unspecified: Secondary | ICD-10-CM | POA: Diagnosis not present

## 2016-12-13 DIAGNOSIS — D519 Vitamin B12 deficiency anemia, unspecified: Secondary | ICD-10-CM | POA: Diagnosis not present

## 2016-12-13 DIAGNOSIS — I130 Hypertensive heart and chronic kidney disease with heart failure and stage 1 through stage 4 chronic kidney disease, or unspecified chronic kidney disease: Secondary | ICD-10-CM | POA: Diagnosis not present

## 2016-12-13 DIAGNOSIS — N183 Chronic kidney disease, stage 3 (moderate): Secondary | ICD-10-CM | POA: Diagnosis not present

## 2016-12-13 DIAGNOSIS — I2781 Cor pulmonale (chronic): Secondary | ICD-10-CM | POA: Diagnosis not present

## 2016-12-13 DIAGNOSIS — K5150 Left sided colitis without complications: Secondary | ICD-10-CM | POA: Diagnosis not present

## 2016-12-13 DIAGNOSIS — D122 Benign neoplasm of ascending colon: Secondary | ICD-10-CM | POA: Diagnosis not present

## 2016-12-17 DIAGNOSIS — N183 Chronic kidney disease, stage 3 (moderate): Secondary | ICD-10-CM | POA: Diagnosis not present

## 2016-12-17 DIAGNOSIS — D519 Vitamin B12 deficiency anemia, unspecified: Secondary | ICD-10-CM | POA: Diagnosis not present

## 2016-12-17 DIAGNOSIS — D472 Monoclonal gammopathy: Secondary | ICD-10-CM | POA: Diagnosis not present

## 2016-12-17 DIAGNOSIS — K5150 Left sided colitis without complications: Secondary | ICD-10-CM | POA: Diagnosis not present

## 2016-12-17 DIAGNOSIS — M109 Gout, unspecified: Secondary | ICD-10-CM | POA: Diagnosis not present

## 2016-12-17 DIAGNOSIS — I130 Hypertensive heart and chronic kidney disease with heart failure and stage 1 through stage 4 chronic kidney disease, or unspecified chronic kidney disease: Secondary | ICD-10-CM | POA: Diagnosis not present

## 2016-12-17 DIAGNOSIS — I2781 Cor pulmonale (chronic): Secondary | ICD-10-CM | POA: Diagnosis not present

## 2016-12-17 DIAGNOSIS — D509 Iron deficiency anemia, unspecified: Secondary | ICD-10-CM | POA: Diagnosis not present

## 2016-12-17 DIAGNOSIS — D122 Benign neoplasm of ascending colon: Secondary | ICD-10-CM | POA: Diagnosis not present

## 2016-12-17 DIAGNOSIS — J449 Chronic obstructive pulmonary disease, unspecified: Secondary | ICD-10-CM | POA: Diagnosis not present

## 2016-12-17 DIAGNOSIS — I5033 Acute on chronic diastolic (congestive) heart failure: Secondary | ICD-10-CM | POA: Diagnosis not present

## 2016-12-17 DIAGNOSIS — E785 Hyperlipidemia, unspecified: Secondary | ICD-10-CM | POA: Diagnosis not present

## 2016-12-20 DIAGNOSIS — N183 Chronic kidney disease, stage 3 (moderate): Secondary | ICD-10-CM | POA: Diagnosis not present

## 2016-12-20 DIAGNOSIS — I5033 Acute on chronic diastolic (congestive) heart failure: Secondary | ICD-10-CM | POA: Diagnosis not present

## 2016-12-20 DIAGNOSIS — J449 Chronic obstructive pulmonary disease, unspecified: Secondary | ICD-10-CM | POA: Diagnosis not present

## 2016-12-20 DIAGNOSIS — K5150 Left sided colitis without complications: Secondary | ICD-10-CM | POA: Diagnosis not present

## 2016-12-20 DIAGNOSIS — D472 Monoclonal gammopathy: Secondary | ICD-10-CM | POA: Diagnosis not present

## 2016-12-20 DIAGNOSIS — M109 Gout, unspecified: Secondary | ICD-10-CM | POA: Diagnosis not present

## 2016-12-20 DIAGNOSIS — E785 Hyperlipidemia, unspecified: Secondary | ICD-10-CM | POA: Diagnosis not present

## 2016-12-20 DIAGNOSIS — I2781 Cor pulmonale (chronic): Secondary | ICD-10-CM | POA: Diagnosis not present

## 2016-12-20 DIAGNOSIS — D122 Benign neoplasm of ascending colon: Secondary | ICD-10-CM | POA: Diagnosis not present

## 2016-12-20 DIAGNOSIS — I130 Hypertensive heart and chronic kidney disease with heart failure and stage 1 through stage 4 chronic kidney disease, or unspecified chronic kidney disease: Secondary | ICD-10-CM | POA: Diagnosis not present

## 2016-12-20 DIAGNOSIS — D509 Iron deficiency anemia, unspecified: Secondary | ICD-10-CM | POA: Diagnosis not present

## 2016-12-20 DIAGNOSIS — D519 Vitamin B12 deficiency anemia, unspecified: Secondary | ICD-10-CM | POA: Diagnosis not present

## 2016-12-22 DIAGNOSIS — J449 Chronic obstructive pulmonary disease, unspecified: Secondary | ICD-10-CM | POA: Diagnosis not present

## 2016-12-26 DIAGNOSIS — D509 Iron deficiency anemia, unspecified: Secondary | ICD-10-CM | POA: Diagnosis not present

## 2016-12-26 DIAGNOSIS — J449 Chronic obstructive pulmonary disease, unspecified: Secondary | ICD-10-CM | POA: Diagnosis not present

## 2016-12-26 DIAGNOSIS — I5033 Acute on chronic diastolic (congestive) heart failure: Secondary | ICD-10-CM | POA: Diagnosis not present

## 2016-12-26 DIAGNOSIS — D472 Monoclonal gammopathy: Secondary | ICD-10-CM | POA: Diagnosis not present

## 2016-12-26 DIAGNOSIS — N183 Chronic kidney disease, stage 3 (moderate): Secondary | ICD-10-CM | POA: Diagnosis not present

## 2016-12-26 DIAGNOSIS — I2781 Cor pulmonale (chronic): Secondary | ICD-10-CM | POA: Diagnosis not present

## 2016-12-26 DIAGNOSIS — K5150 Left sided colitis without complications: Secondary | ICD-10-CM | POA: Diagnosis not present

## 2016-12-26 DIAGNOSIS — M109 Gout, unspecified: Secondary | ICD-10-CM | POA: Diagnosis not present

## 2016-12-26 DIAGNOSIS — D122 Benign neoplasm of ascending colon: Secondary | ICD-10-CM | POA: Diagnosis not present

## 2016-12-26 DIAGNOSIS — D519 Vitamin B12 deficiency anemia, unspecified: Secondary | ICD-10-CM | POA: Diagnosis not present

## 2016-12-26 DIAGNOSIS — E785 Hyperlipidemia, unspecified: Secondary | ICD-10-CM | POA: Diagnosis not present

## 2016-12-26 DIAGNOSIS — I130 Hypertensive heart and chronic kidney disease with heart failure and stage 1 through stage 4 chronic kidney disease, or unspecified chronic kidney disease: Secondary | ICD-10-CM | POA: Diagnosis not present

## 2017-01-18 ENCOUNTER — Other Ambulatory Visit: Payer: Self-pay | Admitting: Internal Medicine

## 2017-01-22 DIAGNOSIS — J449 Chronic obstructive pulmonary disease, unspecified: Secondary | ICD-10-CM | POA: Diagnosis not present

## 2017-02-21 DIAGNOSIS — J449 Chronic obstructive pulmonary disease, unspecified: Secondary | ICD-10-CM | POA: Diagnosis not present

## 2017-03-24 DIAGNOSIS — J449 Chronic obstructive pulmonary disease, unspecified: Secondary | ICD-10-CM | POA: Diagnosis not present

## 2017-04-18 ENCOUNTER — Other Ambulatory Visit: Payer: Self-pay | Admitting: Internal Medicine

## 2017-04-24 DIAGNOSIS — J449 Chronic obstructive pulmonary disease, unspecified: Secondary | ICD-10-CM | POA: Diagnosis not present

## 2017-05-09 ENCOUNTER — Other Ambulatory Visit: Payer: Self-pay | Admitting: Internal Medicine

## 2017-05-09 DIAGNOSIS — I872 Venous insufficiency (chronic) (peripheral): Secondary | ICD-10-CM

## 2017-05-21 ENCOUNTER — Other Ambulatory Visit: Payer: Self-pay | Admitting: Internal Medicine

## 2017-05-24 DIAGNOSIS — J449 Chronic obstructive pulmonary disease, unspecified: Secondary | ICD-10-CM | POA: Diagnosis not present

## 2017-06-24 DIAGNOSIS — J449 Chronic obstructive pulmonary disease, unspecified: Secondary | ICD-10-CM | POA: Diagnosis not present

## 2017-07-18 ENCOUNTER — Ambulatory Visit: Payer: Medicare Other | Admitting: Internal Medicine

## 2017-07-18 ENCOUNTER — Ambulatory Visit (INDEPENDENT_AMBULATORY_CARE_PROVIDER_SITE_OTHER)
Admission: RE | Admit: 2017-07-18 | Discharge: 2017-07-18 | Disposition: A | Discharge status: Home / Self Care | Payer: Medicare Other | Source: Ambulatory Visit | Attending: Internal Medicine | Admitting: Internal Medicine

## 2017-07-18 ENCOUNTER — Encounter: Payer: Self-pay | Admitting: Internal Medicine

## 2017-07-18 VITALS — BP 180/64 | HR 77 | Temp 98.0°F | Resp 16 | Ht 60.0 in | Wt 217.0 lb

## 2017-07-18 DIAGNOSIS — J42 Unspecified chronic bronchitis: Secondary | ICD-10-CM | POA: Diagnosis not present

## 2017-07-18 DIAGNOSIS — J411 Mucopurulent chronic bronchitis: Secondary | ICD-10-CM

## 2017-07-18 DIAGNOSIS — J988 Other specified respiratory disorders: Secondary | ICD-10-CM | POA: Diagnosis not present

## 2017-07-18 DIAGNOSIS — I10 Essential (primary) hypertension: Secondary | ICD-10-CM | POA: Diagnosis not present

## 2017-07-18 DIAGNOSIS — R05 Cough: Secondary | ICD-10-CM | POA: Diagnosis not present

## 2017-07-18 DIAGNOSIS — R059 Cough, unspecified: Secondary | ICD-10-CM

## 2017-07-18 DIAGNOSIS — R0602 Shortness of breath: Secondary | ICD-10-CM | POA: Diagnosis not present

## 2017-07-18 DIAGNOSIS — J441 Chronic obstructive pulmonary disease with (acute) exacerbation: Secondary | ICD-10-CM | POA: Diagnosis not present

## 2017-07-18 MED ORDER — ALBUTEROL SULFATE (2.5 MG/3ML) 0.083% IN NEBU: 2.5000 mg | INHALATION_SOLUTION | Freq: Four times a day (QID) | RESPIRATORY_TRACT | 11 refills | Status: DC | PRN

## 2017-07-18 MED ORDER — CEFDINIR 300 MG PO CAPS: 300.0000 mg | ORAL_CAPSULE | Freq: Two times a day (BID) | ORAL | 0 refills | Status: AC

## 2017-07-18 MED ORDER — GLYCOPYRROLATE 25 MCG/ML IN SOLN: 1.0000 | Freq: Two times a day (BID) | RESPIRATORY_TRACT | 11 refills | Status: DC

## 2017-07-18 MED ORDER — FLUTICASONE FUROATE-VILANTEROL 100-25 MCG/INH IN AEPB: 1.0000 | INHALATION_SPRAY | Freq: Every day | RESPIRATORY_TRACT | 11 refills | Status: DC

## 2017-07-18 MED ORDER — PROMETHAZINE-DM 6.25-15 MG/5ML PO SYRP: 5.0000 mL | ORAL_SOLUTION | Freq: Four times a day (QID) | ORAL | 0 refills | Status: DC | PRN

## 2017-07-18 MED ORDER — TORSEMIDE 20 MG PO TABS: 20.0000 mg | ORAL_TABLET | Freq: Two times a day (BID) | ORAL | 0 refills | Status: DC

## 2017-07-18 NOTE — Patient Instructions (Signed)

## 2017-07-18 NOTE — Progress Notes (Signed)
Subjective:  Patient ID: [name], female    DOB: [birth year]  Age: 76 y.o. MRN: [id number]  CC: Hypertension and Cough   HPI ZIONA WICKENS presents for concerns about a 10-day history of worsening cough with shortness of breath and DOE and phlegm that is thick/yellow and rarely blood-tinged.  She has been using her Breo inhaler as well as albuterol through a nebulizer without much improvement in her symptoms.  She is also had a few headaches but she denies chest pain, diaphoresis, or fatigue.  She is taking Lasix for lower extremity edema and complains that it has not been very beneficial.  Outpatient Medications Prior to Visit  Medication Sig Dispense Refill  . acetaminophen (TYLENOL) 325 MG tablet Take 2 tablets (650 mg total) by mouth every 6 (six) hours as needed for mild pain or headache (or Fever >/= 101).    Marland Kitchen albuterol (PROAIR HFA) 108 (90 Base) MCG/ACT inhaler Inhale 1-2 puffs into the lungs every 6 (six) hours as needed for wheezing or shortness of breath. 18 g 11  . allopurinol (ZYLOPRIM) 100 MG tablet TAKE 1 TABLET BY MOUTH EVERY DAY FOR GOUT 90 tablet 0  . aspirin 81 MG EC tablet Take 81 mg by mouth daily.      . cyanocobalamin 2000 MCG tablet Take 1 tablet (2,000 mcg total) by mouth daily. (Patient taking differently: Take 2,000 mcg by mouth every evening. ) 90 tablet 3  . Dietary Management Product (VASCULERA) TABS Take 1 tablet by mouth daily. 90 tablet 1  . ferrous sulfate 325 (65 FE) MG tablet Take 1 tablet (325 mg total) by mouth 2 (two) times daily with a meal. 60 tablet 11  . hydrocerin (EUCERIN) CREA Apply 1 application topically as directed. 454 g 0  . metoprolol tartrate (LOPRESSOR) 50 MG tablet TAKE 1 TABLET BY MOUTH TWICE DAILY 180 tablet 0  . Vitamin D, Ergocalciferol, (DRISDOL) 50000 units CAPS capsule TAKE ONE CAPSULE BY MOUTH ONCE WEEKLY 12 capsule 3  . albuterol (PROVENTIL) (2.5 MG/3ML) 0.083% nebulizer solution Take 3 mLs (2.5 mg total) by nebulization  every 6 (six) hours as needed for wheezing or shortness of breath. 150 mL 11  . BREO ELLIPTA 100-25 MCG/INH AEPB INHALE 1 PUFF INTO THE LUNGS DAILY 60 each 11  . furosemide (LASIX) 40 MG tablet TAKE 1 TABLET BY MOUTH TWICE DAILY 180 tablet 0  . nystatin (MYCOSTATIN/NYSTOP) powder Apply 1 Bottle topically 2 (two) times daily as needed (skin irritation.). Use as directed twice per day as needed 1 Bottle 0  . triamcinolone cream (KENALOG) 0.5 % Apply 1 application topically 3 (three) times daily. 100 g 1  . promethazine-dextromethorphan (PROMETHAZINE-DM) 6.25-15 MG/5ML syrup Take 5 mLs by mouth 4 (four) times daily as needed for cough. (Patient not taking: Reported on 07/18/2017) 118 mL 0   No facility-administered medications prior to visit.     ROS Review of Systems  Constitutional: Negative for appetite change, chills, diaphoresis, fatigue and fever.  HENT: Negative.   Eyes: Negative.   Respiratory: Positive for cough and shortness of breath. Negative for chest tightness, wheezing and stridor.   Cardiovascular: Positive for leg swelling. Negative for chest pain and palpitations.  Gastrointestinal: Negative for abdominal pain, constipation, diarrhea, nausea and vomiting.  Endocrine: Negative.   Genitourinary: Negative.  Negative for difficulty urinating and dysuria.  Musculoskeletal: Negative.  Negative for back pain, myalgias and neck pain.  Skin: Negative.  Negative for color change and rash.  Allergic/Immunologic: Negative.  Neurological: Negative.  Negative for dizziness and weakness.  Hematological: Negative for adenopathy. Does not bruise/bleed easily.  Psychiatric/Behavioral: Negative.     Objective:  BP (!) 180/64 (BP Location: Left Arm, Patient Position: Sitting, Cuff Size: Large)   Pulse 77   Temp 98 F (36.7 C) (Oral)   Resp 16   Ht 5' (1.524 m)   Wt 217 lb (98.4 kg)   SpO2 94%   BMI 42.38 kg/m   BP Readings from Last 3 Encounters:  07/18/17 (!) 180/64  11/21/16  (!) 158/70  11/11/16 (!) 131/45    Wt Readings from Last 3 Encounters:  07/18/17 217 lb (98.4 kg)  11/21/16 222 lb 12 oz (101 kg)  11/11/16 233 lb 0.4 oz (105.7 kg)    Physical Exam  Constitutional: She is oriented to person, place, and time.  Non-toxic appearance. She does not have a sickly appearance. She does not appear ill. No distress.  HENT:  Mouth/Throat: Oropharynx is clear and moist. No oropharyngeal exudate.  Eyes: Conjunctivae are normal. Left eye exhibits no discharge. No scleral icterus.  Neck: Normal range of motion. Neck supple. No JVD present. No thyromegaly present.  Cardiovascular: Normal rate, regular rhythm and normal heart sounds. Exam reveals no gallop and no friction rub.  No murmur heard. Pulmonary/Chest: Effort normal. No respiratory distress. She has no wheezes. She has no rales.  Abdominal: Soft. Bowel sounds are normal. She exhibits no mass. There is no tenderness. There is no guarding.  Musculoskeletal: Normal range of motion. She exhibits edema (2+ BLE pitting edema). She exhibits no tenderness or deformity.  Lymphadenopathy:    She has no cervical adenopathy.  Neurological: She is alert and oriented to person, place, and time.  Skin: Skin is warm and dry. No rash noted. She is not diaphoretic. No erythema. No pallor.  Vitals reviewed.   Lab Results  Component Value Date   WBC 10.7 (H) 11/21/2016   HGB 8.6 Repeated and verified X2. (L) 11/21/2016   HCT 26.4 Repeated and verified X2. (L) 11/21/2016   PLT 334.0 11/21/2016   GLUCOSE 102 (H) 11/11/2016   CHOL 175 09/04/2016   TRIG 215.0 (H) 09/04/2016   HDL 46.10 09/04/2016   LDLDIRECT 107.0 09/04/2016   LDLCALC 93 08/17/2015   ALT 13 (L) 11/11/2016   AST 16 11/11/2016   NA 137 11/11/2016   K 4.2 11/11/2016   CL 100 (L) 11/11/2016   CREATININE 1.89 (H) 11/11/2016   BUN 28 (H) 11/11/2016   CO2 31 11/11/2016   TSH 2.466 03/20/2016   HGBA1C 5.8 09/04/2016   MICROALBUR 15.9 (H) 02/05/2013     No results found.  No results found.  Assessment & Plan:   Jaxie was seen today for hypertension and cough.  Diagnoses and all orders for this visit:  Cough-- her chest x-ray is negative for mass or infiltrate but does show concerns for pulmonary congestion. -     DG Chest 2 View; Future -     promethazine-dextromethorphan (PROMETHAZINE-DM) 6.25-15 MG/5ML syrup; Take 5 mLs by mouth 4 (four) times daily as needed for cough.  Mucopurulent chronic bronchitis ([place])- I have asked her to add a LAMA through a nebulizer to the LABA/ICS combination inhaler for better symptom relief. -     Glycopyrrolate (LONHALA MAGNAIR REFILL KIT) 25 MCG/ML SOLN; Inhale 1 puff into the lungs 2 (two) times daily. -     Glycopyrrolate (LONHALA MAGNAIR STARTER KIT) 25 MCG/ML SOLN; Inhale 1 Act into the lungs 2 (  two) times daily. -     fluticasone furoate-vilanterol (BREO ELLIPTA) 100-25 MCG/INH AEPB; Inhale 1 puff into the lungs daily.  COPD exacerbation ([place])- Her FeNO score is less than 5 so I do not think her symptoms are related to an allergic or eosinophilic issue.  Will therefore not prescribe systemic steroids. Will improve tx with a LAMA. -     albuterol (PROVENTIL) (2.5 MG/3ML) 0.083% nebulizer solution; Take 3 mLs (2.5 mg total) by nebulization every 6 (six) hours as needed for wheezing or shortness of breath.  Chronic bronchitis, unspecified chronic bronchitis type (HCC) -     albuterol (PROVENTIL) (2.5 MG/3ML) 0.083% nebulizer solution; Take 3 mLs (2.5 mg total) by nebulization every 6 (six) hours as needed for wheezing or shortness of breath.  Morbid obesity ([place])- she is working on her lifestyle modifications to lose weight.  Essential hypertension- Her BP is not adequately well controlled and she has signs of fluid overload.  I am concerned she is not absorbing the furosemide very well so will change to torsemide. -     torsemide (DEMADEX) 20 MG tablet; Take 1 tablet (20 mg total) by mouth 2  (two) times daily.  RTI (respiratory tract infection)- I will treat the infection with Omnicef and will control the cough with Phenergan DM. -     promethazine-dextromethorphan (PROMETHAZINE-DM) 6.25-15 MG/5ML syrup; Take 5 mLs by mouth 4 (four) times daily as needed for cough.   I have discontinued Larry Sierras. Torgeson's triamcinolone cream, nystatin, and furosemide. I have also changed her BREO ELLIPTA to fluticasone furoate-vilanterol. Additionally, I am having her start on Glycopyrrolate, Glycopyrrolate, torsemide, and cefdinir. Lastly, I am having her maintain her aspirin, acetaminophen, cyanocobalamin, ferrous sulfate, Vitamin D (Ergocalciferol), albuterol, hydrocerin, metoprolol tartrate, VASCULERA, allopurinol, albuterol, and promethazine-dextromethorphan.  Meds ordered this encounter  Medications  . Glycopyrrolate (LONHALA MAGNAIR REFILL KIT) 25 MCG/ML SOLN    Sig: Inhale 1 puff into the lungs 2 (two) times daily.    Dispense:  60 mL    Refill:  11  . Glycopyrrolate (LONHALA MAGNAIR STARTER KIT) 25 MCG/ML SOLN    Sig: Inhale 1 Act into the lungs 2 (two) times daily.    Dispense:  60 mL    Refill:  11  . fluticasone furoate-vilanterol (BREO ELLIPTA) 100-25 MCG/INH AEPB    Sig: Inhale 1 puff into the lungs daily.    Dispense:  60 each    Refill:  11  . albuterol (PROVENTIL) (2.5 MG/3ML) 0.083% nebulizer solution    Sig: Take 3 mLs (2.5 mg total) by nebulization every 6 (six) hours as needed for wheezing or shortness of breath.    Dispense:  150 mL    Refill:  11  . promethazine-dextromethorphan (PROMETHAZINE-DM) 6.25-15 MG/5ML syrup    Sig: Take 5 mLs by mouth 4 (four) times daily as needed for cough.    Dispense:  118 mL    Refill:  0  . torsemide (DEMADEX) 20 MG tablet    Sig: Take 1 tablet (20 mg total) by mouth 2 (two) times daily.    Dispense:  180 tablet    Refill:  0  . cefdinir (OMNICEF) 300 MG capsule    Sig: Take 1 capsule (300 mg total) by mouth 2 (two) times daily  for 10 days.    Dispense:  20 capsule    Refill:  0     Follow-up: Return in about 3 weeks (around 08/08/2017).  [name], MD

## 2017-07-24 ENCOUNTER — Other Ambulatory Visit: Payer: Self-pay | Admitting: Internal Medicine

## 2017-07-24 DIAGNOSIS — J449 Chronic obstructive pulmonary disease, unspecified: Secondary | ICD-10-CM | POA: Diagnosis not present

## 2017-08-08 ENCOUNTER — Emergency Department (HOSPITAL_COMMUNITY): Payer: Medicare Other

## 2017-08-08 ENCOUNTER — Other Ambulatory Visit: Payer: Self-pay

## 2017-08-08 ENCOUNTER — Inpatient Hospital Stay (HOSPITAL_COMMUNITY)
Admission: EM | Admit: 2017-08-08 | Discharge: 2017-08-15 | DRG: 291 | Disposition: A | Discharge status: Home Health | Payer: Medicare Other | Attending: Internal Medicine | Admitting: Internal Medicine

## 2017-08-08 ENCOUNTER — Telehealth: Payer: Self-pay | Admitting: *Deleted

## 2017-08-08 ENCOUNTER — Encounter (HOSPITAL_COMMUNITY): Payer: Self-pay | Admitting: Emergency Medicine

## 2017-08-08 ENCOUNTER — Encounter: Payer: Self-pay | Admitting: Internal Medicine

## 2017-08-08 ENCOUNTER — Ambulatory Visit: Payer: Medicare Other | Admitting: Internal Medicine

## 2017-08-08 ENCOUNTER — Other Ambulatory Visit (INDEPENDENT_AMBULATORY_CARE_PROVIDER_SITE_OTHER): Payer: Medicare Other

## 2017-08-08 VITALS — BP 180/50 | HR 82 | Temp 98.0°F | Resp 16 | Ht 60.0 in | Wt 216.0 lb

## 2017-08-08 DIAGNOSIS — D508 Other iron deficiency anemias: Secondary | ICD-10-CM

## 2017-08-08 DIAGNOSIS — N183 Chronic kidney disease, stage 3 unspecified: Secondary | ICD-10-CM

## 2017-08-08 DIAGNOSIS — N189 Chronic kidney disease, unspecified: Secondary | ICD-10-CM

## 2017-08-08 DIAGNOSIS — D649 Anemia, unspecified: Secondary | ICD-10-CM | POA: Diagnosis present

## 2017-08-08 DIAGNOSIS — I872 Venous insufficiency (chronic) (peripheral): Secondary | ICD-10-CM | POA: Diagnosis not present

## 2017-08-08 DIAGNOSIS — I509 Heart failure, unspecified: Secondary | ICD-10-CM | POA: Diagnosis not present

## 2017-08-08 DIAGNOSIS — D500 Iron deficiency anemia secondary to blood loss (chronic): Secondary | ICD-10-CM | POA: Diagnosis not present

## 2017-08-08 DIAGNOSIS — I519 Heart disease, unspecified: Secondary | ICD-10-CM

## 2017-08-08 DIAGNOSIS — R5381 Other malaise: Secondary | ICD-10-CM | POA: Diagnosis not present

## 2017-08-08 DIAGNOSIS — I340 Nonrheumatic mitral (valve) insufficiency: Secondary | ICD-10-CM | POA: Diagnosis not present

## 2017-08-08 DIAGNOSIS — K31819 Angiodysplasia of stomach and duodenum without bleeding: Secondary | ICD-10-CM | POA: Diagnosis present

## 2017-08-08 DIAGNOSIS — N179 Acute kidney failure, unspecified: Secondary | ICD-10-CM | POA: Diagnosis present

## 2017-08-08 DIAGNOSIS — I5189 Other ill-defined heart diseases: Secondary | ICD-10-CM

## 2017-08-08 DIAGNOSIS — R300 Dysuria: Secondary | ICD-10-CM | POA: Insufficient documentation

## 2017-08-08 DIAGNOSIS — M109 Gout, unspecified: Secondary | ICD-10-CM | POA: Diagnosis present

## 2017-08-08 DIAGNOSIS — E785 Hyperlipidemia, unspecified: Secondary | ICD-10-CM | POA: Diagnosis not present

## 2017-08-08 DIAGNOSIS — D472 Monoclonal gammopathy: Secondary | ICD-10-CM | POA: Diagnosis present

## 2017-08-08 DIAGNOSIS — I10 Essential (primary) hypertension: Secondary | ICD-10-CM | POA: Diagnosis not present

## 2017-08-08 DIAGNOSIS — I5033 Acute on chronic diastolic (congestive) heart failure: Secondary | ICD-10-CM | POA: Diagnosis not present

## 2017-08-08 DIAGNOSIS — R0602 Shortness of breath: Secondary | ICD-10-CM

## 2017-08-08 DIAGNOSIS — Z9981 Dependence on supplemental oxygen: Secondary | ICD-10-CM | POA: Diagnosis not present

## 2017-08-08 DIAGNOSIS — Z86010 Personal history of colonic polyps: Secondary | ICD-10-CM

## 2017-08-08 DIAGNOSIS — J9811 Atelectasis: Secondary | ICD-10-CM | POA: Diagnosis present

## 2017-08-08 DIAGNOSIS — R600 Localized edema: Secondary | ICD-10-CM | POA: Diagnosis not present

## 2017-08-08 DIAGNOSIS — J810 Acute pulmonary edema: Secondary | ICD-10-CM | POA: Diagnosis not present

## 2017-08-08 DIAGNOSIS — Z87891 Personal history of nicotine dependence: Secondary | ICD-10-CM

## 2017-08-08 DIAGNOSIS — I2781 Cor pulmonale (chronic): Secondary | ICD-10-CM | POA: Diagnosis not present

## 2017-08-08 DIAGNOSIS — I130 Hypertensive heart and chronic kidney disease with heart failure and stage 1 through stage 4 chronic kidney disease, or unspecified chronic kidney disease: Secondary | ICD-10-CM | POA: Diagnosis not present

## 2017-08-08 DIAGNOSIS — I110 Hypertensive heart disease with heart failure: Secondary | ICD-10-CM | POA: Diagnosis not present

## 2017-08-08 DIAGNOSIS — I160 Hypertensive urgency: Secondary | ICD-10-CM | POA: Diagnosis present

## 2017-08-08 DIAGNOSIS — K5520 Angiodysplasia of colon without hemorrhage: Secondary | ICD-10-CM | POA: Diagnosis present

## 2017-08-08 DIAGNOSIS — R079 Chest pain, unspecified: Secondary | ICD-10-CM | POA: Diagnosis not present

## 2017-08-08 DIAGNOSIS — K219 Gastro-esophageal reflux disease without esophagitis: Secondary | ICD-10-CM | POA: Diagnosis present

## 2017-08-08 DIAGNOSIS — K922 Gastrointestinal hemorrhage, unspecified: Secondary | ICD-10-CM | POA: Diagnosis present

## 2017-08-08 DIAGNOSIS — Z7982 Long term (current) use of aspirin: Secondary | ICD-10-CM | POA: Diagnosis not present

## 2017-08-08 DIAGNOSIS — J9611 Chronic respiratory failure with hypoxia: Secondary | ICD-10-CM | POA: Diagnosis not present

## 2017-08-08 DIAGNOSIS — Z885 Allergy status to narcotic agent status: Secondary | ICD-10-CM

## 2017-08-08 DIAGNOSIS — R195 Other fecal abnormalities: Secondary | ICD-10-CM | POA: Diagnosis not present

## 2017-08-08 DIAGNOSIS — Z8249 Family history of ischemic heart disease and other diseases of the circulatory system: Secondary | ICD-10-CM

## 2017-08-08 DIAGNOSIS — Z66 Do not resuscitate: Secondary | ICD-10-CM | POA: Diagnosis not present

## 2017-08-08 DIAGNOSIS — G4733 Obstructive sleep apnea (adult) (pediatric): Secondary | ICD-10-CM | POA: Diagnosis not present

## 2017-08-08 DIAGNOSIS — K5150 Left sided colitis without complications: Secondary | ICD-10-CM | POA: Diagnosis present

## 2017-08-08 DIAGNOSIS — I161 Hypertensive emergency: Secondary | ICD-10-CM | POA: Diagnosis not present

## 2017-08-08 DIAGNOSIS — E669 Obesity, unspecified: Secondary | ICD-10-CM | POA: Diagnosis present

## 2017-08-08 DIAGNOSIS — D509 Iron deficiency anemia, unspecified: Secondary | ICD-10-CM | POA: Diagnosis present

## 2017-08-08 DIAGNOSIS — J449 Chronic obstructive pulmonary disease, unspecified: Secondary | ICD-10-CM | POA: Diagnosis not present

## 2017-08-08 DIAGNOSIS — N1832 Chronic kidney disease, stage 3b: Secondary | ICD-10-CM | POA: Diagnosis present

## 2017-08-08 DIAGNOSIS — Z79899 Other long term (current) drug therapy: Secondary | ICD-10-CM

## 2017-08-08 DIAGNOSIS — Z881 Allergy status to other antibiotic agents status: Secondary | ICD-10-CM

## 2017-08-08 DIAGNOSIS — I5032 Chronic diastolic (congestive) heart failure: Secondary | ICD-10-CM

## 2017-08-08 DIAGNOSIS — J811 Chronic pulmonary edema: Secondary | ICD-10-CM | POA: Diagnosis present

## 2017-08-08 DIAGNOSIS — Z713 Dietary counseling and surveillance: Secondary | ICD-10-CM

## 2017-08-08 DIAGNOSIS — Z888 Allergy status to other drugs, medicaments and biological substances status: Secondary | ICD-10-CM

## 2017-08-08 DIAGNOSIS — Z801 Family history of malignant neoplasm of trachea, bronchus and lung: Secondary | ICD-10-CM

## 2017-08-08 LAB — URINALYSIS, ROUTINE W REFLEX MICROSCOPIC
Bilirubin Urine: NEGATIVE
HGB URINE DIPSTICK: NEGATIVE
KETONES UR: NEGATIVE
Leukocytes, UA: NEGATIVE
NITRITE: NEGATIVE
SPECIFIC GRAVITY, URINE: 1.01 (ref 1.000–1.030)
URINE GLUCOSE: NEGATIVE
Urobilinogen, UA: 0.2 (ref 0.0–1.0)
pH: 7.5 (ref 5.0–8.0)

## 2017-08-08 LAB — CBC WITH DIFFERENTIAL/PLATELET
BASOS PCT: 0.8 % (ref 0.0–3.0)
Basophils Absolute: 0.1 10*3/uL (ref 0.0–0.1)
EOS ABS: 0.2 10*3/uL (ref 0.0–0.7)
EOS PCT: 2 % (ref 0.0–5.0)
LYMPHS PCT: 16 % (ref 12.0–46.0)
Lymphs Abs: 1.6 10*3/uL (ref 0.7–4.0)
MCHC: 30.2 g/dL (ref 30.0–36.0)
MCV: 72 fl — ABNORMAL LOW (ref 78.0–100.0)
MONO ABS: 1.1 10*3/uL — AB (ref 0.1–1.0)
Monocytes Relative: 10.4 % (ref 3.0–12.0)
Neutro Abs: 7.2 10*3/uL (ref 1.4–7.7)
Neutrophils Relative %: 70.8 % (ref 43.0–77.0)
Platelets: 274 10*3/uL (ref 150.0–400.0)
RBC: 2.7 Mil/uL — AB (ref 3.87–5.11)
RDW: 21.2 % — AB (ref 11.5–15.5)
WBC: 10.2 10*3/uL (ref 4.0–10.5)

## 2017-08-08 LAB — IBC PANEL
IRON: 15 ug/dL — AB (ref 42–145)
Saturation Ratios: 3.3 % — ABNORMAL LOW (ref 20.0–50.0)
TRANSFERRIN: 321 mg/dL (ref 212.0–360.0)

## 2017-08-08 LAB — CBC
HCT: 20 % — ABNORMAL LOW (ref 36.0–46.0)
Hemoglobin: 5.6 g/dL — CL (ref 12.0–15.0)
MCH: 21.4 pg — ABNORMAL LOW (ref 26.0–34.0)
MCHC: 28 g/dL — AB (ref 30.0–36.0)
MCV: 76.3 fL — ABNORMAL LOW (ref 78.0–100.0)
PLATELETS: 232 10*3/uL (ref 150–400)
RBC: 2.62 MIL/uL — ABNORMAL LOW (ref 3.87–5.11)
RDW: 19.8 % — AB (ref 11.5–15.5)
WBC: 8.6 10*3/uL (ref 4.0–10.5)

## 2017-08-08 LAB — COMPREHENSIVE METABOLIC PANEL
ALBUMIN: 3.5 g/dL (ref 3.5–5.2)
ALK PHOS: 53 U/L (ref 39–117)
ALK PHOS: 55 U/L (ref 38–126)
ALT: 7 U/L (ref 0–35)
ALT: 11 U/L — AB (ref 14–54)
AST: 12 U/L (ref 0–37)
AST: 17 U/L (ref 15–41)
Albumin: 3 g/dL — ABNORMAL LOW (ref 3.5–5.0)
Anion gap: 5 (ref 5–15)
BILIRUBIN TOTAL: 0.4 mg/dL (ref 0.2–1.2)
BILIRUBIN TOTAL: 0.6 mg/dL (ref 0.3–1.2)
BUN: 16 mg/dL (ref 6–23)
BUN: 17 mg/dL (ref 6–20)
CALCIUM: 9.8 mg/dL (ref 8.4–10.5)
CO2: 34 mEq/L — ABNORMAL HIGH (ref 19–32)
CO2: 32 mmol/L (ref 22–32)
CREATININE: 1.29 mg/dL — AB (ref 0.40–1.20)
CREATININE: 1.31 mg/dL — AB (ref 0.44–1.00)
Calcium: 9.2 mg/dL (ref 8.9–10.3)
Chloride: 99 mEq/L (ref 96–112)
Chloride: 100 mmol/L — ABNORMAL LOW (ref 101–111)
GFR calc Af Amer: 44 mL/min — ABNORMAL LOW (ref >60)
GFR, EST NON AFRICAN AMERICAN: 38 mL/min — AB (ref >60)
GFR: 42.59 mL/min — ABNORMAL LOW (ref >60.00)
Glucose, Bld: 114 mg/dL — ABNORMAL HIGH (ref 70–99)
Glucose, Bld: 103 mg/dL — ABNORMAL HIGH (ref 65–99)
Potassium: 3.9 mEq/L (ref 3.5–5.1)
Potassium: 3.5 mmol/L (ref 3.5–5.1)
SODIUM: 139 meq/L (ref 135–145)
Sodium: 137 mmol/L (ref 135–145)
TOTAL PROTEIN: 7.1 g/dL (ref 6.5–8.1)
Total Protein: 7.5 g/dL (ref 6.0–8.3)

## 2017-08-08 LAB — PROTIME-INR
INR: 1.07
PROTHROMBIN TIME: 13.8 s (ref 11.4–15.2)

## 2017-08-08 LAB — FERRITIN: Ferritin: 16.7 ng/mL (ref 10.0–291.0)

## 2017-08-08 LAB — PREPARE RBC (CROSSMATCH)

## 2017-08-08 LAB — TROPONIN I

## 2017-08-08 LAB — BRAIN NATRIURETIC PEPTIDE
B NATRIURETIC PEPTIDE 5: 268.3 pg/mL — AB (ref 0.0–100.0)
Pro B Natriuretic peptide (BNP): 304 pg/mL — ABNORMAL HIGH (ref 0.0–100.0)

## 2017-08-08 MED ORDER — SODIUM CHLORIDE 0.9 % IV SOLN
Freq: Once | INTRAVENOUS | Status: AC
  Administered 2017-08-09: via INTRAVENOUS

## 2017-08-08 MED ORDER — AZILSARTAN MEDOXOMIL 40 MG PO TABS: 1.0000 | ORAL_TABLET | Freq: Every day | ORAL | 0 refills | Status: DC

## 2017-08-08 MED ORDER — METOLAZONE 5 MG PO TABS: 5.0000 mg | ORAL_TABLET | Freq: Every day | ORAL | 0 refills | Status: DC

## 2017-08-08 MED ORDER — FUROSEMIDE 10 MG/ML IJ SOLN
40.0000 mg | Freq: Once | INTRAMUSCULAR | Status: AC
  Administered 2017-08-08: 40 mg via INTRAVENOUS
  Filled 2017-08-08: qty 4

## 2017-08-08 NOTE — ED Provider Notes (Signed)
[facility] DEPT Provider Note   CSN: [id number] Arrival date & time: 08/08/17  1849     History   Chief Complaint Chief Complaint  Patient presents with  . Abnormal Lab    HPI [name] is a 77 y.o. female.  HPI  77 year old female with a history of CHF and ulcerative colitis presents with anemia.  She was called by her doctor to come into the hospital due to a hemoglobin of 5.9 obtained today.  The patient has chronic anemia but this is worse than typical.  She last had a blood transfusion in 2017.  She has been following closely with her doctor due to progressive leg swelling and shortness of breath for about 5 weeks.  She feels like she has to much fluid/CHF.  Has had a cough during this time as well.  Intermittent chest pain, right-sided.  She has chronic abdominal pain from her hernia that is not different than typical.  She states that she chronically has some mild blood when  having a bowel movement but she does note that earlier this week she had moderate epistaxis out of her right nose as well as last week she had a large bowel movement with a large amount of blood.  She is on a baby aspirin but no other blood thinners.  Past Medical History:  Diagnosis Date  . CHF (congestive heart failure) ([place]) 08/31/03  . Colonic polyp 02/16/2008   Tubular adenoma  . COPD (chronic obstructive pulmonary disease) ([place]) 01/27/98  . GERD (gastroesophageal reflux disease) 07/30/92  . Gout   . HTN (hypertension) 07/30/1988  . Hyperlipidemia 11/27/96  . Kidney stone 1960, 1972, 1991  . Renal insufficiency   . Ulcerative colitis     Patient Active Problem List   Diagnosis Date Noted  . Grade III diastolic dysfunction 44/07/270  . Dysuria 08/08/2017  . Pulmonary edema 08/08/2017  . MGUS (monoclonal gammopathy of unknown significance) 11/08/2016  . Asteatotic eczema 02/25/2016  . Benign neoplasm of ascending colon   . COLD (chronic obstructive lung disease)  ([place])   . Anemia, iron deficiency 11/16/2014  . Cor pulmonale ([place]) 11/16/2014  . OSA (obstructive sleep apnea) 06/22/2013  . Chronic venous stasis dermatitis of both lower extremities 06/02/2013  . Vitamin D deficiency 02/06/2013  . Osteopenia, senile 02/05/2013  . Other screening mammogram 02/05/2013  . Ulcer of leg, chronic, right ([place]) 10/10/2011  . CKD (chronic kidney disease), stage III ([place]) 06/20/2010  . Morbid obesity ([place]) 04/19/2010  . Gout 01/10/2009  . ULCERATIVE COLITIS-LEFT SIDE 03/19/2008  . Vitamin B12 deficiency anemia 11/13/2006  . Bilateral lower extremity edema 11/13/2006  . Prediabetes 11/13/2006  . Hyperlipidemia with target LDL less than 100 11/27/1996  . GERD 07/30/1992  . HTN (hypertension) 07/30/1988    Past Surgical History:  Procedure Laterality Date  . BRONCHOSCOPY    . COLONOSCOPY WITH PROPOFOL N/A 02/23/2015   Procedure: COLONOSCOPY WITH PROPOFOL;  Surgeon: Ladene Artist, MD;  Location: [facility]COPY;  Service: Endoscopy;  Laterality: N/A;  . ESOPHAGOGASTRODUODENOSCOPY (EGD) WITH PROPOFOL N/A 02/23/2015   Procedure: ESOPHAGOGASTRODUODENOSCOPY (EGD) WITH PROPOFOL;  Surgeon: Ladene Artist, MD;  Location: [facility] ENDOSCOPY;  Service: Endoscopy;  Laterality: N/A;  . NO PAST SURGERIES      OB History    No data available       Home Medications    Prior to Admission medications   Medication Sig Start Date End Date Taking? Authorizing Provider  acetaminophen (TYLENOL)  325 MG tablet Take 650 mg by mouth every 6 (six) hours as needed for moderate pain, fever or headache.   Yes [provider]  albuterol (PROAIR HFA) 108 (90 Base) MCG/ACT inhaler Inhale 1-2 puffs into the lungs every 6 (six) hours as needed for wheezing or shortness of breath. 10/30/16  Yes [name], MD  albuterol (PROVENTIL) (2.5 MG/3ML) 0.083% nebulizer solution Take 3 mLs (2.5 mg total) by nebulization every 6 (six) hours as needed for wheezing or shortness of breath.  07/18/17  Yes [name], MD  allopurinol (ZYLOPRIM) 100 MG tablet TAKE 1 TABLET BY MOUTH EVERY DAY FOR GOUT 05/21/17  Yes [name], MD  aspirin 81 MG EC tablet Take 81 mg by mouth daily.     Yes [provider]  Azilsartan Medoxomil (EDARBI) 40 MG TABS Take 1 tablet by mouth daily. 08/08/17  Yes [name], MD  cyanocobalamin 2000 MCG tablet Take 1 tablet (2,000 mcg total) by mouth daily. Patient taking differently: Take 2,000 mcg by mouth every evening.  02/24/16  Yes [name], MD  Dietary Management Product (VASCULERA) TABS Take 1 tablet by mouth daily. 05/09/17  Yes [name], MD  ferrous sulfate 325 (65 FE) MG tablet Take 1 tablet (325 mg total) by mouth 2 (two) times daily with a meal. 02/24/16  Yes [name], MD  fluticasone furoate-vilanterol (BREO ELLIPTA) 100-25 MCG/INH AEPB Inhale 1 puff into the lungs daily. 07/18/17  Yes [name], MD  hydrocerin (EUCERIN) CREA Apply 1 application topically as directed. 11/11/16  Yes Sheikh, Omair Latif, DO  metolazone (ZAROXOLYN) 5 MG tablet Take 1 tablet (5 mg total) by mouth daily. 08/08/17  Yes [name], MD  metoprolol tartrate (LOPRESSOR) 50 MG tablet TAKE 1 TABLET BY MOUTH TWICE DAILY 07/24/17  Yes [name], MD  promethazine-dextromethorphan (PROMETHAZINE-DM) 6.25-15 MG/5ML syrup Take 5 mLs by mouth 4 (four) times daily as needed for cough. 07/18/17  Yes [provider]  torsemide (DEMADEX) 20 MG tablet Take 1 tablet (20 mg total) by mouth 2 (two) times daily. 07/18/17  Yes [name], MD  Vitamin D, Ergocalciferol, (DRISDOL) 50000 units CAPS capsule TAKE ONE CAPSULE BY MOUTH ONCE WEEKLY 08/09/16  Yes [name], MD    Family History Family History  Problem Relation Age of Onset  . Stroke Mother   . Diabetes Mother   . Kidney cancer Mother   . Stomach cancer Mother   . Heart disease Mother   . Stroke Father   . Heart disease Brother   . Heart disease Brother     . Crohn's disease Brother   . Heart disease Sister        CATH,STENT  . Clotting disorder Sister   . Cervical cancer Sister   . Lung cancer Sister   . Heart attack Sister   . Diabetes Sister   . Colon cancer Neg Hx     Social History Social History   Tobacco Use  . Smoking status: Former Smoker    Last attempt to quit: 06/01/1996    Years since quitting: 21.2  . Smokeless tobacco: Never Used  . Tobacco comment: Quit 1998  Substance Use Topics  . Alcohol use: No  . Drug use: No     Allergies   Celebrex [celecoxib]; Enalapril; Lipitor [atorvastatin]; Amlodipine besylate; Amoxicillin; Codeine sulfate; and Hydrocodone-acetaminophen   Review of Systems Review of Systems  Constitutional: Negative for fever.  Respiratory: Positive for cough and shortness of breath.   Cardiovascular: Positive for chest pain and leg swelling.  Gastrointestinal: Positive for abdominal pain and blood in stool. Negative for vomiting.  All other systems reviewed and are negative.    Physical Exam Updated Vital Signs BP (!) 236/42 (BP Location: Left Arm)   Pulse 73   Temp 97.9 F (36.6 C) (Oral)   Resp 19   SpO2 100%   Physical Exam  Constitutional: She is oriented to person, place, and time. She appears well-developed and well-nourished.  Obese, no acute distress  HENT:  Head: Normocephalic and atraumatic.  Right Ear: External ear normal.  Left Ear: External ear normal.  Nose: Nose normal.  Eyes: Right eye exhibits no discharge. Left eye exhibits no discharge.  Cardiovascular: Normal rate, regular rhythm and normal heart sounds.  Pulmonary/Chest: Effort normal. She has rales in the right lower field and the left lower field.  Abdominal: Soft. There is tenderness (mild, generalized).  Genitourinary:  Genitourinary Comments: Patient defers rectal exam  Neurological: She is alert and oriented to person, place, and time.  Skin: Skin is warm and dry.  Nursing note and vitals  reviewed.    ED Treatments / Results  Labs (all labs ordered are listed, but only abnormal results are displayed) Labs Reviewed  COMPREHENSIVE METABOLIC PANEL - Abnormal; Notable for the following components:      Result Value   Chloride 100 (*)    Glucose, Bld 103 (*)    Creatinine, Ser 1.31 (*)    Albumin 3.0 (*)    ALT 11 (*)    GFR calc non Af Amer 38 (*)    GFR calc Af Amer 44 (*)    All other components within normal limits  CBC - Abnormal; Notable for the following components:   RBC 2.62 (*)    Hemoglobin 5.6 (*)    HCT 20.0 (*)    MCV 76.3 (*)    MCH 21.4 (*)    MCHC 28.0 (*)    RDW 19.8 (*)    All other components within normal limits  BRAIN NATRIURETIC PEPTIDE - Abnormal; Notable for the following components:   B Natriuretic Peptide 268.3 (*)    All other components within normal limits  TROPONIN I  PROTIME-INR  POC OCCULT BLOOD, ED  TYPE AND SCREEN  PREPARE RBC (CROSSMATCH)    EKG  EKG Interpretation  Date/Time:  Thursday August 08 2017 19:17:06 EST Ventricular Rate:  78 PR Interval:    QRS Duration: 90 QT Interval:  393 QTC Calculation: 448 R Axis:   49 Text Interpretation:  Sinus rhythm Probable LVH with secondary repol abnrm Baseline wander in lead(s) V2 Since last EKG, there are ST depressions with scooping in lateral precordial leads No apparent reciprocal changes Confirmed by Duffy Bruce [phone]) on 08/08/2017 7:22:00 PM       Radiology Dg Chest 2 View  Result Date: 08/08/2017 CLINICAL DATA:  Shortness of breath and low hemoglobin. EXAM: CHEST  2 VIEW COMPARISON:  07/18/2017 FINDINGS: Lungs are adequately inflated demonstrate mild hazy prominence of the perihilar and bibasilar regions without significant change likely mild vascular congestion. Small bilateral pleural effusions unchanged to slightly worse. Mild stable cardiomegaly. Remainder of the exam is unchanged. IMPRESSION: Findings suggesting mild CHF with slightly worse small bilateral  pleural effusions. Electronically Signed   By: Marin Olp M.D.   On: 08/08/2017 19:42    Procedures .Critical Care Performed by: Sherwood Gambler, MD Authorized by: Regenia Skeeter,  Nicki Reaper, MD   Critical care provider statement:    Critical care time (minutes):  30   Critical care was necessary to treat or prevent imminent or life-threatening deterioration of the following conditions:  Cardiac failure (anemia)   Critical care was time spent personally by me on the following activities:  Development of treatment plan with patient or surrogate, discussions with consultants, examination of patient, obtaining history from patient or surrogate, ordering and review of laboratory studies, ordering and review of radiographic studies, ordering and performing treatments and interventions, pulse oximetry, re-evaluation of patient's condition and review of old charts   (including critical care time)  Medications Ordered in ED Medications  0.9 %  sodium chloride infusion (not administered)  furosemide (LASIX) injection 40 mg (40 mg Intravenous Given 08/08/17 2205)     Initial Impression / Assessment and Plan / ED Course  I have reviewed the triage vital signs and the nursing notes.  Pertinent labs & imaging results that were available during my care of the patient were reviewed by me and considered in my medical decision making (see chart for details).     Patient has a combination of CHF/fluid overload as well as symptomatic anemia.  Probably has been having anemia from GI bleeding which she states she has had before.  She defers GU exam and understands that I might not be able to fully assess where the bleeding is coming from.  I will give her IV Lasix as she is currently fluid overloaded and then give her slow course of blood transfusion.  She will need admission for further treatments and monitoring.  She does not appear to be actively bleeding.  Dr. Aggie Moats to admit  Final Clinical Impressions(s) / ED  Diagnoses   Final diagnoses:  Symptomatic anemia  Acute on chronic diastolic congestive heart failure [facility])    ED Discharge Orders    None       Sherwood Gambler, MD 08/08/17 2353

## 2017-08-08 NOTE — ED Notes (Signed)
Pt placed on 2 lpm [place].

## 2017-08-08 NOTE — ED Notes (Signed)
Orthostatic discontinued by [facility] EDP

## 2017-08-08 NOTE — ED Notes (Signed)
Pt reports she went to her doctor's office today d/t SOB.  She was instructed to come to the ED d/t CHF exacerbation.  Then on the way home, she received a call from her doctor's office stating her Hgb was 5.2.  Pt reports she received 2 units of PRBCs and an Fe infusion Summer of last year.  Her Hgb then was 6.  Pt reports SOB, abd pain and low back pain.  She endorses bright red blood last week.  Yesterday, she noticed a large amount of blood in her stool.  She reports the last time she had a colonoscopy that the capillaries in her colon was "leaking".

## 2017-08-08 NOTE — Telephone Encounter (Signed)
Granddaughter informed that patient needs to go to hospital for a transfusion. Granddaughter stated understanding.

## 2017-08-08 NOTE — ED Notes (Signed)
Pure wick placed.

## 2017-08-08 NOTE — Patient Instructions (Signed)
Edema Edema is an abnormal buildup of fluids in your bodytissues. Edema is somewhatdependent on gravity to pull the fluid to the lowest place in your body. That makes the condition more common in the legs and thighs (lower extremities). Painless swelling of the feet and ankles is common and becomes more likely as you get older. It is also common in looser tissues, like around your eyes. When the affected area is squeezed, the fluid may move out of that spot and leave a dent for a few moments. This dent is called pitting. What are the causes? There are many possible causes of edema. Eating too much salt and being on your feet or sitting for a long time can cause edema in your legs and ankles. Hot weather may make edema worse. Common medical causes of edema include:  Heart failure.  Liver disease.  Kidney disease.  Weak blood vessels in your legs.  Cancer.  An injury.  Pregnancy.  Some medications.  Obesity.  What are the signs or symptoms? Edema is usually painless.Your skin may look swollen or shiny. How is this diagnosed? Your health care provider may be able to diagnose edema by asking about your medical history and doing a physical exam. You may need to have tests such as X-rays, an electrocardiogram, or blood tests to check for medical conditions that may cause edema. How is this treated? Edema treatment depends on the cause. If you have heart, liver, or kidney disease, you need the treatment appropriate for these conditions. General treatment may include:  Elevation of the affected body part above the level of your heart.  Compression of the affected body part. Pressure from elastic bandages or support stockings squeezes the tissues and forces fluid back into the blood vessels. This keeps fluid from entering the tissues.  Restriction of fluid and salt intake.  Use of a water pill (diuretic). These medications are appropriate only for some types of edema. They pull fluid  out of your body and make you urinate more often. This gets rid of fluid and reduces swelling, but diuretics can have side effects. Only use diuretics as directed by your health care provider.  Follow these instructions at home:  Keep the affected body part above the level of your heart when you are lying down.  Do not sit still or stand for prolonged periods.  Do not put anything directly under your knees when lying down.  Do not wear constricting clothing or garters on your upper legs.  Exercise your legs to work the fluid back into your blood vessels. This may help the swelling go down.  Wear elastic bandages or support stockings to reduce ankle swelling as directed by your health care provider.  Eat a low-salt diet to reduce fluid if your health care provider recommends it.  Only take medicines as directed by your health care provider. Contact a health care provider if:  Your edema is not responding to treatment.  You have heart, liver, or kidney disease and notice symptoms of edema.  You have edema in your legs that does not improve after elevating them.  You have sudden and unexplained weight gain. Get help right away if:  You develop shortness of breath or chest pain.  You cannot breathe when you lie down.  You develop pain, redness, or warmth in the swollen areas.  You have heart, liver, or kidney disease and suddenly get edema.  You have a fever and your symptoms suddenly get worse. This information is   not intended to replace advice given to you by your health care provider. Make sure you discuss any questions you have with your health care provider. Document Released: 07/16/2005 Document Revised: 12/22/2015 Document Reviewed: 05/08/2013 Elsevier Interactive Patient Education  2017 [employer]  

## 2017-08-08 NOTE — ED Notes (Addendum)
Lab called reporting pt tested positive for antibodies and it will be a while before her blood is ready.  Pt and family and [name] EDP made aware

## 2017-08-08 NOTE — Telephone Encounter (Signed)
Please call the patient and ask her if she is willing to be admitted for a transfusion

## 2017-08-08 NOTE — Progress Notes (Signed)
Subjective:  Patient ID: [name], female    DOB: 29-Oct-1940  Age: 76 y.o. MRN: [id number]  CC: Shortness of Breath   HPI [name] presents for f/up - she complains of worsening shortness of breath and increasing oxygen dependence.  She complains of worsening lower extremity edema and decreased urine output.  She also complains of dysuria, fever, and palpitations.  No facility-administered medications prior to visit.    Outpatient Medications Prior to Visit  Medication Sig Dispense Refill  . albuterol (PROAIR HFA) 108 (90 Base) MCG/ACT inhaler Inhale 1-2 puffs into the lungs every 6 (six) hours as needed for wheezing or shortness of breath. 18 g 11  . albuterol (PROVENTIL) (2.5 MG/3ML) 0.083% nebulizer solution Take 3 mLs (2.5 mg total) by nebulization every 6 (six) hours as needed for wheezing or shortness of breath. 150 mL 11  . allopurinol (ZYLOPRIM) 100 MG tablet TAKE 1 TABLET BY MOUTH EVERY DAY FOR GOUT 90 tablet 0  . aspirin 81 MG EC tablet Take 81 mg by mouth daily.      . cyanocobalamin 2000 MCG tablet Take 1 tablet (2,000 mcg total) by mouth daily. (Patient taking differently: Take 2,000 mcg by mouth every evening. ) 90 tablet 3  . Dietary Management Product (VASCULERA) TABS Take 1 tablet by mouth daily. 90 tablet 1  . ferrous sulfate 325 (65 FE) MG tablet Take 1 tablet (325 mg total) by mouth 2 (two) times daily with a meal. 60 tablet 11  . fluticasone furoate-vilanterol (BREO ELLIPTA) 100-25 MCG/INH AEPB Inhale 1 puff into the lungs daily. 60 each 11  . hydrocerin (EUCERIN) CREA Apply 1 application topically as directed. 454 g 0  . metoprolol tartrate (LOPRESSOR) 50 MG tablet TAKE 1 TABLET BY MOUTH TWICE DAILY 180 tablet 0  . torsemide (DEMADEX) 20 MG tablet Take 1 tablet (20 mg total) by mouth 2 (two) times daily. 180 tablet 0  . Vitamin D, Ergocalciferol, (DRISDOL) 50000 units CAPS capsule TAKE ONE CAPSULE BY MOUTH ONCE WEEKLY 12 capsule 3  . acetaminophen  (TYLENOL) 325 MG tablet Take 2 tablets (650 mg total) by mouth every 6 (six) hours as needed for mild pain or headache (or Fever >/= 101). (Patient not taking: Reported on 08/08/2017)    . Glycopyrrolate (LONHALA MAGNAIR REFILL KIT) 25 MCG/ML SOLN Inhale 1 puff into the lungs 2 (two) times daily. 60 mL 11  . Glycopyrrolate (LONHALA MAGNAIR STARTER KIT) 25 MCG/ML SOLN Inhale 1 Act into the lungs 2 (two) times daily. 60 mL 11  . promethazine-dextromethorphan (PROMETHAZINE-DM) 6.25-15 MG/5ML syrup Take 5 mLs by mouth 4 (four) times daily as needed for cough. 118 mL 0    ROS Review of Systems  Constitutional: Positive for activity change, fatigue and fever. Negative for appetite change, chills and unexpected weight change.  HENT: Negative for sore throat.   Eyes: Negative.   Respiratory: Positive for shortness of breath. Negative for cough, chest tightness, wheezing and stridor.   Cardiovascular: Positive for leg swelling. Negative for chest pain and palpitations.  Gastrointestinal: Negative for abdominal pain, diarrhea, nausea and vomiting.  Endocrine: Negative.   Genitourinary: Positive for decreased urine volume and dysuria. Negative for difficulty urinating, frequency and urgency.  Musculoskeletal: Negative.   Skin: Negative.  Negative for color change and pallor.  Neurological: Positive for weakness. Negative for dizziness and light-headedness.  Hematological: Negative for adenopathy. Does not bruise/bleed easily.  Psychiatric/Behavioral: Negative.     Objective:  BP (!) 180/50 (BP Location:  Left Arm, Patient Position: Sitting, Cuff Size: Large)   Pulse 82   Temp 98 F (36.7 C) (Oral)   Resp 16   Ht 5' (1.524 m)   Wt 216 lb (98 kg)   SpO2 94%   BMI 42.18 kg/m   BP Readings from Last 3 Encounters:  08/09/17 (!) 197/66  08/08/17 (!) 180/50  07/18/17 (!) 180/64    Wt Readings from Last 3 Encounters:  08/08/17 216 lb (98 kg)  07/18/17 217 lb (98.4 kg)  11/21/16 222 lb 12 oz  (101 kg)    Physical Exam  Constitutional: She is oriented to person, place, and time. No distress.  HENT:  Mouth/Throat: Oropharynx is clear and moist. No oropharyngeal exudate.  Eyes: Conjunctivae are normal. No scleral icterus.  Neck: Normal range of motion. Neck supple. No JVD present.  Cardiovascular: Normal rate and regular rhythm. Exam reveals no gallop and no friction rub.  Murmur heard.  Systolic murmur is present with a grade of 1/6. EKG ---  Sinus  Rhythm  Voltage criteria for LVH  (S(V1)+R(V5) exceeds 4.00 mV).   -Poor R-wave progression -may be secondary to left ventricular hypertrophy   consider old anterior infarct.   -Nonspecific ST depression  -Seen with left ventricular hypertrophy (strain) or digitalis effect. LVH is more prominent  Pulmonary/Chest: Effort normal. No respiratory distress. She has no wheezes. She has no rales. She exhibits no tenderness.  Abdominal: Soft. Bowel sounds are normal. She exhibits no distension and no mass.  Musculoskeletal: Normal range of motion. She exhibits edema (3+ LE pitting edema). She exhibits no tenderness or deformity.  Neurological: She is alert and oriented to person, place, and time.  Skin: Skin is warm and dry. No rash noted. She is not diaphoretic. No erythema. No pallor.  Vitals reviewed.   Lab Results  Component Value Date   WBC 7.9 08/09/2017   HGB 6.0 (LL) 08/09/2017   HCT 20.5 (L) 08/09/2017   PLT 209 08/09/2017   GLUCOSE 100 (H) 08/09/2017   CHOL 175 09/04/2016   TRIG 215.0 (H) 09/04/2016   HDL 46.10 09/04/2016   LDLDIRECT 107.0 09/04/2016   LDLCALC 93 08/17/2015   ALT 11 (L) 08/08/2017   AST 17 08/08/2017   NA 138 08/09/2017   K 3.4 (L) 08/09/2017   CL 101 08/09/2017   CREATININE 1.31 (H) 08/09/2017   BUN 16 08/09/2017   CO2 29 08/09/2017   TSH 3.22 08/08/2017   INR 1.07 08/08/2017   HGBA1C 5.8 09/04/2016   MICROALBUR 15.9 (H) 02/05/2013    Dg Chest 2 View  Result Date: 07/18/2017 CLINICAL  DATA:  Cough, congestion, and shortness of breath for the past 2 weeks. EXAM: CHEST  2 VIEW COMPARISON:  Chest x-ray dated November 08, 2016. FINDINGS: Stable mild cardiomegaly. Increased interstitial markings, basal predominant. Small left pleural effusion. No consolidation or pneumothorax. No acute osseous abnormality. IMPRESSION: 1. Mild pulmonary interstitial edema and small left pleural effusion. Electronically Signed   By: Titus Dubin M.D.   On: 07/18/2017 15:23    Assessment & Plan:   Anyela was seen today for shortness of breath.  Diagnoses and all orders for this visit:  CKD (chronic kidney disease), stage III (HCC)-this is stable. -     Comprehensive metabolic panel; Future -     Urinalysis, Routine w reflex microscopic; Future  Bilateral lower extremity edema- she initially told me she was not willing to be admitted for treatment of this.  The initial plan was  to add another diuretic to the loop diuretic.  Subsequently she was found to have a critical anemia and was referred to the ED. -     Thyroid Panel With TSH; Future -     Brain natriuretic peptide; Future -     metolazone (ZAROXOLYN) 5 MG tablet; Take 1 tablet (5 mg total) by mouth daily.  Other iron deficiency anemia-she has severe, symptomatic anemia.  Initially she told me she was not willing to be admitted but we contacted her and told her about the anemia and she has agreed to go to the ED to consider getting an infusion. -     CBC with Differential/Platelet; Future -     IBC panel; Future -     Ferritin; Future  Essential hypertension-blood pressure is not well controlled. -     EKG 12-Lead -     Azilsartan Medoxomil (EDARBI) 40 MG TABS; Take 1 tablet by mouth daily.  Cor pulmonale (HCC)  Grade III diastolic dysfunction- as above -     Brain natriuretic peptide; Future -     metolazone (ZAROXOLYN) 5 MG tablet; Take 1 tablet (5 mg total) by mouth daily. -     Azilsartan Medoxomil (EDARBI) 40 MG TABS; Take 1  tablet by mouth daily. -     Ambulatory referral to Cardiology  Dysuria -     Urinalysis, Routine w reflex microscopic; Future -     CULTURE, URINE COMPREHENSIVE; Future   I have discontinued Larry Sierras. Eberly's Glycopyrrolate, Glycopyrrolate, and promethazine-dextromethorphan. I am also having her start on metolazone and Azilsartan Medoxomil. Additionally, I am having her maintain her aspirin, cyanocobalamin, ferrous sulfate, Vitamin D (Ergocalciferol), albuterol, hydrocerin, VASCULERA, allopurinol, fluticasone furoate-vilanterol, albuterol, torsemide, and metoprolol tartrate.  Meds ordered this encounter  Medications  . metolazone (ZAROXOLYN) 5 MG tablet    Sig: Take 1 tablet (5 mg total) by mouth daily.    Dispense:  30 tablet    Refill:  0  . Azilsartan Medoxomil (EDARBI) 40 MG TABS    Sig: Take 1 tablet by mouth daily.    Dispense:  30 tablet    Refill:  0     Follow-up: Return in about 1 week (around 08/15/2017).  [name], MD

## 2017-08-08 NOTE — ED Triage Notes (Signed)
Pt sent by primary care related to "low hemoglobin 5.7 or 5.9." Pt verbalizes went today for SOB; "I thought it was from too much fluid."

## 2017-08-08 NOTE — Telephone Encounter (Signed)
CRITICAL VALUE STICKER  CRITICAL VALUE: Hemoglobin 5.9   HCT 19.5    RECEIVER (on-site recipient of call):  DATE & TIME NOTIFIED: 08/08/17 at 4:51pm   MESSENGER (representative from lab): Santiago Glad   MD NOTIFIED: Dr. Ronnald Ramp   TIME OF NOTIFICATION:4:51pm  RESPONSE:

## 2017-08-09 ENCOUNTER — Inpatient Hospital Stay (HOSPITAL_COMMUNITY): Payer: Medicare Other

## 2017-08-09 ENCOUNTER — Other Ambulatory Visit: Payer: Self-pay

## 2017-08-09 DIAGNOSIS — K31819 Angiodysplasia of stomach and duodenum without bleeding: Secondary | ICD-10-CM | POA: Diagnosis present

## 2017-08-09 DIAGNOSIS — K5520 Angiodysplasia of colon without hemorrhage: Secondary | ICD-10-CM | POA: Diagnosis present

## 2017-08-09 DIAGNOSIS — D649 Anemia, unspecified: Secondary | ICD-10-CM

## 2017-08-09 DIAGNOSIS — D500 Iron deficiency anemia secondary to blood loss (chronic): Secondary | ICD-10-CM

## 2017-08-09 DIAGNOSIS — J810 Acute pulmonary edema: Secondary | ICD-10-CM

## 2017-08-09 DIAGNOSIS — I340 Nonrheumatic mitral (valve) insufficiency: Secondary | ICD-10-CM

## 2017-08-09 DIAGNOSIS — R195 Other fecal abnormalities: Secondary | ICD-10-CM | POA: Insufficient documentation

## 2017-08-09 LAB — CBC
HEMATOCRIT: 20.5 % — AB (ref 36.0–46.0)
Hemoglobin: 6 g/dL — CL (ref 12.0–15.0)
MCH: 22.6 pg — ABNORMAL LOW (ref 26.0–34.0)
MCHC: 29.3 g/dL — ABNORMAL LOW (ref 30.0–36.0)
MCV: 77.4 fL — AB (ref 78.0–100.0)
PLATELETS: 209 10*3/uL (ref 150–400)
RBC: 2.65 MIL/uL — AB (ref 3.87–5.11)
RDW: 20.5 % — ABNORMAL HIGH (ref 11.5–15.5)
WBC: 7.9 10*3/uL (ref 4.0–10.5)

## 2017-08-09 LAB — BASIC METABOLIC PANEL
Anion gap: 8 (ref 5–15)
BUN: 16 mg/dL (ref 6–20)
CHLORIDE: 101 mmol/L (ref 101–111)
CO2: 29 mmol/L (ref 22–32)
CREATININE: 1.31 mg/dL — AB (ref 0.44–1.00)
Calcium: 9.1 mg/dL (ref 8.9–10.3)
GFR calc non Af Amer: 38 mL/min — ABNORMAL LOW (ref >60)
GFR, EST AFRICAN AMERICAN: 44 mL/min — AB (ref >60)
Glucose, Bld: 100 mg/dL — ABNORMAL HIGH (ref 65–99)
POTASSIUM: 3.4 mmol/L — AB (ref 3.5–5.1)
SODIUM: 138 mmol/L (ref 135–145)

## 2017-08-09 LAB — THYROID PANEL WITH TSH
FREE THYROXINE INDEX: 2.9 (ref 1.4–3.8)
T3 Uptake: 28 % (ref 22–35)
T4 TOTAL: 10.2 ug/dL (ref 5.1–11.9)
TSH: 3.22 mIU/L (ref 0.40–4.50)

## 2017-08-09 LAB — HEMOGLOBIN AND HEMATOCRIT, BLOOD
HEMATOCRIT: 28.9 % — AB (ref 36.0–46.0)
HEMOGLOBIN: 8.4 g/dL — AB (ref 12.0–15.0)

## 2017-08-09 LAB — TROPONIN I: Troponin I: <0.03 ng/mL (ref <0.03)

## 2017-08-09 LAB — ECHOCARDIOGRAM COMPLETE
Height: 60 in
WEIGHTICAEL: 3456 [oz_av]

## 2017-08-09 MED ORDER — PERFLUTREN LIPID MICROSPHERE
INTRAVENOUS | Status: AC
  Filled 2017-08-09: qty 10

## 2017-08-09 MED ORDER — FAMOTIDINE 20 MG PO TABS
20.0000 mg | ORAL_TABLET | Freq: Two times a day (BID) | ORAL | Status: DC
  Administered 2017-08-09 – 2017-08-11 (×6): 20 mg via ORAL
  Filled 2017-08-09 (×6): qty 1

## 2017-08-09 MED ORDER — HYDRALAZINE HCL 25 MG PO TABS: 25.0000 mg | ORAL_TABLET | Freq: Three times a day (TID) | ORAL | Status: DC

## 2017-08-09 MED ORDER — METOPROLOL TARTRATE 50 MG PO TABS
50.0000 mg | ORAL_TABLET | Freq: Two times a day (BID) | ORAL | Status: DC
  Administered 2017-08-09 – 2017-08-11 (×5): 50 mg via ORAL
  Filled 2017-08-09 (×2): qty 2
  Filled 2017-08-09 (×2): qty 1
  Filled 2017-08-09: qty 2

## 2017-08-09 MED ORDER — ONDANSETRON HCL 4 MG PO TABS: 4.0000 mg | ORAL_TABLET | Freq: Four times a day (QID) | ORAL | Status: DC | PRN

## 2017-08-09 MED ORDER — FLUTICASONE FUROATE-VILANTEROL 100-25 MCG/INH IN AEPB
1.0000 | INHALATION_SPRAY | Freq: Every day | RESPIRATORY_TRACT | Status: DC
  Administered 2017-08-09 – 2017-08-15 (×5): 1 via RESPIRATORY_TRACT
  Filled 2017-08-09: qty 28

## 2017-08-09 MED ORDER — FUROSEMIDE 10 MG/ML IJ SOLN
40.0000 mg | Freq: Two times a day (BID) | INTRAMUSCULAR | Status: DC
  Administered 2017-08-09 – 2017-08-10 (×3): 40 mg via INTRAVENOUS
  Filled 2017-08-09 (×3): qty 4

## 2017-08-09 MED ORDER — VITAMIN B-12 1000 MCG PO TABS
2000.0000 ug | ORAL_TABLET | Freq: Every day | ORAL | Status: DC
  Administered 2017-08-09 – 2017-08-15 (×7): 2000 ug via ORAL
  Filled 2017-08-09 (×8): qty 2

## 2017-08-09 MED ORDER — SODIUM CHLORIDE 0.9% FLUSH
3.0000 mL | Freq: Two times a day (BID) | INTRAVENOUS | Status: DC
  Administered 2017-08-09 – 2017-08-14 (×11): 3 mL via INTRAVENOUS

## 2017-08-09 MED ORDER — PERFLUTREN LIPID MICROSPHERE
1.0000 mL | INTRAVENOUS | Status: AC | PRN
  Administered 2017-08-09: 4 mL via INTRAVENOUS
  Filled 2017-08-09: qty 10

## 2017-08-09 MED ORDER — HYDRALAZINE HCL 25 MG PO TABS
25.0000 mg | ORAL_TABLET | Freq: Three times a day (TID) | ORAL | Status: DC
  Administered 2017-08-09 – 2017-08-10 (×4): 25 mg via ORAL
  Filled 2017-08-09 (×6): qty 1

## 2017-08-09 MED ORDER — ACETAMINOPHEN 325 MG PO TABS
650.0000 mg | ORAL_TABLET | Freq: Four times a day (QID) | ORAL | Status: DC | PRN
  Administered 2017-08-09 – 2017-08-15 (×3): 650 mg via ORAL
  Filled 2017-08-09 (×3): qty 2

## 2017-08-09 MED ORDER — ONDANSETRON HCL 4 MG/2ML IJ SOLN: 4.0000 mg | Freq: Four times a day (QID) | INTRAMUSCULAR | Status: DC | PRN

## 2017-08-09 MED ORDER — ALBUTEROL SULFATE (2.5 MG/3ML) 0.083% IN NEBU: 2.5000 mg | INHALATION_SOLUTION | Freq: Four times a day (QID) | RESPIRATORY_TRACT | Status: DC | PRN

## 2017-08-09 MED ORDER — POTASSIUM CHLORIDE CRYS ER 20 MEQ PO TBCR
40.0000 meq | EXTENDED_RELEASE_TABLET | Freq: Once | ORAL | Status: AC
  Administered 2017-08-09: 40 meq via ORAL
  Filled 2017-08-09: qty 2

## 2017-08-09 MED ORDER — ACETAMINOPHEN 650 MG RE SUPP: 650.0000 mg | Freq: Four times a day (QID) | RECTAL | Status: DC | PRN

## 2017-08-09 MED ORDER — ALLOPURINOL 100 MG PO TABS
100.0000 mg | ORAL_TABLET | Freq: Every day | ORAL | Status: DC
  Administered 2017-08-09 – 2017-08-15 (×7): 100 mg via ORAL
  Filled 2017-08-09 (×8): qty 1

## 2017-08-09 NOTE — ED Notes (Signed)
Set H&H to be drawn at 1100

## 2017-08-09 NOTE — Consult Note (Signed)
[facility] Consult: 10:58 AM 08/09/2017  LOS: 1 day    Referring Provider: Dr Grandville Silos  Primary Care Physician:  [name], MD Primary Gastroenterologist:  Dr. Carlean Purl    Reason for Consultation:  Anemia.  FOBT+.     HPI: [name] is a 77 y.o. female.  PMH obesity.  COPD, on home oxygen.  OSA, not on CPAP.  Diastolic heart failure. MGUS.  CKD stage 3.   Chronic anemia, attributed to blood loss from UGI and LGI AVMs.  IBD.   1995 colonoscopy with small, hyperplastic polyp and proctitis (path: no active proctitis). 2003 colonoscopy with internal hemorrhoids but no recurrent polyps. 2009 colonoscopy with left-sided colitis (path: Minimally active chronic colitis), diverticulosis and polyp (TA, no HGD). 02/23/15 EGD for FOBT +, anemia.  Non-bleeding AVM in D3, was not treated.  Small HH. 02/23/2015 Colonoscopy:  Dr Fuller Plan.  Ascending colon tubular adenoma.  4 non-bleeding cecal AVMs, were not treated.  Sigmoid tics.  Int rrhoids.   Dr Fuller Plan suggested no repeat colonoscopy due to age and long term oral FE for chronic blood loss from AVMs.     Patient had bleeding per rectum for a few days in August 2017.  She was anemic and had not been taking prescribed oral iron because the type of iron prescribed was too expensive.  Dr. Fuller Plan began Entocort for possible recurrent segmental colitis (IBD versus SCAD).  He also recommended intravenous iron replacement (she received this once as inpt) along with oral iron (taking this bid to date). Occasionally sees small amounts of blood in her stool.  The last episode was a week ago Wednesday where she saw blood mixed in with her stool, this occurred once.  The volume of blood she saw was larger than normal. Patient also periodically sees minor to moderate amounts of blood  in her nasal discharge.  Earlier this week she had a larger volume single episode of epistaxis.  This did not cause her stools to turn black or bloody.  Patient also complains of infrequent episodes of nausea vomiting.  The last was over 6 weeks ago.  She does not get heartburn or indigestion.  The only issue with swallowing is when she has to swallow potassium pills.  Takes 81 ASA daily, not on PPI etc.    Patient had a follow-up office visit with her PMD yesterday.  She had been seen on 1220 at the office for cough and hypertension.  Medications were adjusted.  Lasix was discontinued.  She was started on torsemide.  She was prescribed 10-day course of Cefdinir.  Nebulizer medication was also changed.  The nebulizer medication caused her to cough up blood so she stopped it and reverted back to using her Breo Ellipta.  Cough improved.  She continues to have increased beyond the normal dyspnea on exertion for about 2 weeks.  She has chronic PND and sleeps in a lounge chair.  If she has to lay flat she gets short of breath.  She has chronic lower extremity edema and erythema.  Lab work was obtained yesterday.  On the way home, she received a call from PMD office telling her to go to the ER because she was anemic.  Hgb 5.9  Baseline is 7.5 to 8.5 back in 10/2016.  FOBT +.    MCV 72.  Ferritin 16 (low normal), iron 15 (low), iron saturation 3.3% (low) BUN is not elevated.  Troponins not elevated. BNP elevated.  CXR with small, bil pleural effusions associated with atelectasis.Marland Kitchen    Received 2 units PRBC finishing up the last at 0400 this morning. Fup Hgb 8.4.      Past Medical History:  Diagnosis Date  . CHF (congestive heart failure) ([place]) 08/31/03  . Colonic polyp 02/16/2008   Tubular adenoma  . COPD (chronic obstructive pulmonary disease) ([place]) 01/27/98  . GERD (gastroesophageal reflux disease) 07/30/92  . Gout   . HTN (hypertension) 07/30/1988  . Hyperlipidemia 11/27/96  . Kidney stone 1960, 1972,  1991  . Renal insufficiency   . Ulcerative colitis     Past Surgical History:  Procedure Laterality Date  . BRONCHOSCOPY    . COLONOSCOPY WITH PROPOFOL N/A 02/23/2015   Procedure: COLONOSCOPY WITH PROPOFOL;  Surgeon: Ladene Artist, MD;  Location: [facility];  Service: Endoscopy;  Laterality: N/A;  . ESOPHAGOGASTRODUODENOSCOPY (EGD) WITH PROPOFOL N/A 02/23/2015   Procedure: ESOPHAGOGASTRODUODENOSCOPY (EGD) WITH PROPOFOL;  Surgeon: Ladene Artist, MD;  Location: [facility];  Service: Endoscopy;  Laterality: N/A;  . NO PAST SURGERIES      Prior to Admission medications   Medication Sig Start Date End Date Taking? Authorizing Provider  acetaminophen (TYLENOL) 325 MG tablet Take 650 mg by mouth every 6 (six) hours as needed for moderate pain, fever or headache.   Yes [provider]  albuterol (PROAIR HFA) 108 (90 Base) MCG/ACT inhaler Inhale 1-2 puffs into the lungs every 6 (six) hours as needed for wheezing or shortness of breath. 10/30/16  Yes [name], MD  albuterol (PROVENTIL) (2.5 MG/3ML) 0.083% nebulizer solution Take 3 mLs (2.5 mg total) by nebulization every 6 (six) hours as needed for wheezing or shortness of breath. 07/18/17  Yes [name], MD  allopurinol (ZYLOPRIM) 100 MG tablet TAKE 1 TABLET BY MOUTH EVERY DAY FOR GOUT 05/21/17  Yes [name], MD  aspirin 81 MG EC tablet Take 81 mg by mouth daily.     Yes [provider]  Azilsartan Medoxomil (EDARBI) 40 MG TABS Take 1 tablet by mouth daily. 08/08/17  Yes [name], MD  cyanocobalamin 2000 MCG tablet Take 1 tablet (2,000 mcg total) by mouth daily. Patient taking differently: Take 2,000 mcg by mouth every evening.  02/24/16  Yes [name], MD  Dietary Management Product (VASCULERA) TABS Take 1 tablet by mouth daily. 05/09/17  Yes [name], MD  ferrous sulfate 325 (65 FE) MG tablet Take 1 tablet (325 mg total) by mouth 2 (two) times daily with a meal. 02/24/16  Yes [name], MD  fluticasone furoate-vilanterol (BREO ELLIPTA) 100-25 MCG/INH AEPB Inhale 1 puff into the lungs daily. 07/18/17  Yes [name], MD  hydrocerin (EUCERIN) CREA Apply 1 application topically as directed. 11/11/16  Yes Sheikh, Omair Latif, DO  metolazone (ZAROXOLYN) 5 MG tablet Take 1 tablet (5 mg total) by mouth daily. 08/08/17  Yes [name], MD  metoprolol tartrate (LOPRESSOR) 50 MG tablet TAKE 1 TABLET BY MOUTH TWICE DAILY 07/24/17  Yes [name], MD  promethazine-dextromethorphan (PROMETHAZINE-DM)  6.25-15 MG/5ML syrup Take 5 mLs by mouth 4 (four) times daily as needed for cough. 07/18/17  Yes [provider]  torsemide (DEMADEX) 20 MG tablet Take 1 tablet (20 mg total) by mouth 2 (two) times daily. 07/18/17  Yes [name], MD  Vitamin D, Ergocalciferol, (DRISDOL) 50000 units CAPS capsule TAKE ONE CAPSULE BY MOUTH ONCE WEEKLY 08/09/16  Yes [name], MD    Scheduled Meds: . allopurinol  100 mg Oral Daily  . fluticasone furoate-vilanterol  1 puff Inhalation Daily  . furosemide  40 mg Intravenous Q12H  . metoprolol tartrate  50 mg Oral BID  . sodium chloride flush  3 mL Intravenous Q12H  . cyanocobalamin  2,000 mcg Oral Daily   Infusions:  PRN Meds: acetaminophen **OR** acetaminophen, albuterol, ondansetron **OR** ondansetron (ZOFRAN) IV, perflutren lipid microspheres (DEFINITY) IV suspension   Allergies as of 08/08/2017 - Review Complete 08/08/2017  Allergen Reaction Noted  . Celebrex [celecoxib]  10/10/2011  . Enalapril Cough 02/19/2011  . Lipitor [atorvastatin]  04/14/2014  . Amlodipine besylate  05/25/2010  . Amoxicillin    . Codeine sulfate    . Hydrocodone-acetaminophen      Family History  Problem Relation Age of Onset  . Stroke Mother   . Diabetes Mother   . Kidney cancer Mother   . Stomach cancer Mother   . Heart disease Mother   . Stroke Father   . Heart disease Brother   . Heart disease Brother   . Crohn's disease Brother    . Heart disease Sister        CATH,STENT  . Clotting disorder Sister   . Cervical cancer Sister   . Lung cancer Sister   . Heart attack Sister   . Diabetes Sister   . Colon cancer Neg Hx     Social History   Socioeconomic History  . Marital status: Married    Spouse name: Not on file  . Number of children: 3  . Years of education: Not on file  . Highest education level: Not on file  Social Needs  . Financial resource strain: Not on file  . Food insecurity - worry: Not on file  . Food insecurity - inability: Not on file  . Transportation needs - medical: Not on file  . Transportation needs - non-medical: Not on file  Occupational History  . Occupation: Surveyor, quantity: [employer]  Tobacco Use  . Smoking status: Former Smoker    Last attempt to quit: 06/01/1996    Years since quitting: 21.2  . Smokeless tobacco: Never Used  . Tobacco comment: Quit 1998  Substance and Sexual Activity  . Alcohol use: No  . Drug use: No  . Sexual activity: Not Currently  Other Topics Concern  . Not on file  Social History Narrative   Daily Caffeine Use:  4   Regular Exercise -  NO       REVIEW OF SYSTEMS: Constitutional:  Per HPI ENT:  No nose bleeds Pulm:  Per HPI CV:  No palpitations, no chest pain.  LE edema and redness GU:  No hematuria, no frequency GI:  Per HPI Heme:  Per HPI   Transfusions: The only time she received a transfusion was in August 2017 when she received 2 U PRBCs.   Neuro:  No headaches, no peripheral tingling or numbness Derm:  No itching, no rash or sores.  Endocrine:  No sweats or chills.  No polyuria or dysuria Immunization: Reviewed.  She is up-to-date on multiple vaccinations including her flu shot. Travel:  None beyond local counties in last few months.    PHYSICAL EXAM: Vital signs in last 24 hours: Vitals:   08/09/17 1015 08/09/17 1045  BP: (!) 177/58 (!) 195/68  Pulse: 69 77  Resp: (!) 22 (!) 24  Temp:    SpO2: 97% 98%   Wt Readings  from Last 3 Encounters:  08/08/17 98 kg (216 lb)  07/18/17 98.4 kg (217 lb)  11/21/16 101 kg (222 lb 12 oz)    General: Obese.  Chronically ill looking, somewhat pale WF who is comfortable Head: No facial asymmetry or swelling.  No signs of head trauma. Eyes: No scleral icterus.  No conjunctival pallor.  EOMI. Ears: Not hard of hearing Nose: No discharge Mouth: Tongue midline.  Oral mucosa moist, pink, clear. Neck: No JVD.  No masses, no thyromegaly. Lungs: Clear bilaterally Heart: RRR.  No MRG.  S1, S2 present. Abdomen: Umbilical hernia.  Soft, obese.  Active bowel sounds.  No HSM, no masses, no bruits.  No hernias..   Rectal: Difficult, cord exam, finger only reached maybe 1 cm into the rectal vault.  No visible or palpable masses.  No stool or blood on the exam glove. Musc/Skeltl: No joint erythema, contracture deformity or swelling Extremities: Venous stasis changes with hyperemia and swelling in the lower legs. Neurologic: Alert.  Oriented x3.  Moves all 4 limbs, strength not tested.  No tremor. Skin: Venous insufficiency changes in the lower legs.  No telangiectasias on the trunk. Nodes: No cervical adenopathy. Psych: Pleasant, cooperative.  Intake/Output from previous day: 01/10 0701 - 01/11 0700 In: 945 [Blood:945] Out: 950 [Urine:950] Intake/Output this shift: Total I/O In: 711.7 [I.V.:81.7; Blood:630] Out: -   LAB RESULTS: Recent Labs    08/08/17 1622 08/08/17 2047 08/09/17 0306  WBC 10.2 8.6 7.9  HGB 5.9 Repeated and verified X2.* 5.6* 6.0*  HCT 19.5 Repeated and verified X2.* 20.0* 20.5*  PLT 274.0 232 209   BMET Lab Results  Component Value Date   NA 138 08/09/2017   NA 137 08/08/2017   NA 139 08/08/2017   K 3.4 (L) 08/09/2017   K 3.5 08/08/2017   K 3.9 08/08/2017   CL 101 08/09/2017   CL 100 (L) 08/08/2017   CL 99 08/08/2017   CO2 29 08/09/2017   CO2 32 08/08/2017   CO2 34 (H) 08/08/2017   GLUCOSE 100 (H) 08/09/2017   GLUCOSE 103 (H)  08/08/2017   GLUCOSE 114 (H) 08/08/2017   BUN 16 08/09/2017   BUN 17 08/08/2017   BUN 16 08/08/2017   CREATININE 1.31 (H) 08/09/2017   CREATININE 1.31 (H) 08/08/2017   CREATININE 1.29 (H) 08/08/2017   CALCIUM 9.1 08/09/2017   CALCIUM 9.2 08/08/2017   CALCIUM 9.8 08/08/2017   LFT Recent Labs    08/08/17 1622 08/08/17 2047  PROT 7.5 7.1  ALBUMIN 3.5 3.0*  AST 12 17  ALT 7 11*  ALKPHOS 53 55  BILITOT 0.4 0.6   PT/INR Lab Results  Component Value Date   INR 1.07 08/08/2017   Hepatitis Panel No results for input(s): HEPBSAG, HCVAB, HEPAIGM, HEPBIGM in the last 72 hours. C-Diff No components found for: CDIFF Lipase     Component Value Date/Time   LIPASE 31.0 07/27/2010 1428    Drugs of Abuse  No results found for: LABOPIA, COCAINSCRNUR, LABBENZ, AMPHETMU, THCU, LABBARB   RADIOLOGY STUDIES: Dg Chest 2 View  Result Date: 08/08/2017 CLINICAL DATA:  Shortness of breath and low hemoglobin. EXAM: CHEST  2 VIEW COMPARISON:  07/18/2017 FINDINGS: Lungs are adequately inflated demonstrate mild hazy prominence of the perihilar and bibasilar regions without significant change likely mild vascular congestion. Small bilateral pleural effusions unchanged to slightly worse. Mild stable cardiomegaly. Remainder of the exam is unchanged. IMPRESSION: Findings suggesting mild CHF with slightly worse small bilateral pleural effusions. Electronically Signed   By: Marin Olp M.D.   On: 08/08/2017 19:42   Dg Chest Port 1 View  Result Date: 08/09/2017 CLINICAL DATA:  77 year old female with CHF. EXAM: PORTABLE CHEST 1 VIEW COMPARISON:  Chest radiograph dated 08/08/2017. FINDINGS: Small bilateral effusions with associated atelectatic changes of the lung bases, grossly similar to prior radiograph. Pneumonia is not excluded. Clinical correlation is recommended. No pneumothorax. Stable mild cardiomegaly. Atherosclerotic calcification of the aorta. No acute osseous pathology. IMPRESSION: No  significant change in the bilateral pleural effusions are atelectatic changes of the lung bases. Clinical correlation and follow-up recommended. Electronically Signed   By: Anner Crete M.D.   On: 08/09/2017 05:33    IMPRESSION:   *   Acute on chronic iron deficiency anemia.  Patient has been compliant with taking twice daily oral iron.  Though she did not take it during the 10-day course of antibiotics last month (instructions included with the medication said not to take iron with the antibiotic). S/p PRBC x 2 with good response.  FOBT +.  History of upper GI as well as lower GI AVMs.  Occasional rectal bleeding, long-standing.  Segmental colitis in 2009, not seen on colonoscopy in 2016.  Any 1 of these or all of these could be a source for both the anemia and FOBT +.  Suspect the bulk of her blood loss anemia is coming from AVMs.  *  CKD stage 3, this may be contributing somewhat to anemia of chronic disease.  *   COPD.  Course of antibiotics began 07/18/17.  Current x-ray showing bilateral pleural effusions with associated atelectasis.    *   Periodic nasal bleeding, episode of true epistaxis last week.  Not sure that the volume of bleeding she describes is enough to account for significant blood loss.      PLAN:     *  Dr Henrene Pastor to see pt.  No procedures planned for today.  ? EGD to find and treat AVMs?  Pt is frail, not sure she would tolerate colonoscopy ? Parenteral iron?.  Adding Pepcid for her intermittent nausea, vomiting.    carb mod diet.    *   Since it is already established that she is FOBT positive.  Did not see need to repeat FOBT testing x 3 as ordered.  So I canceled this order  Azucena Freed  08/09/2017, 10:58 AM Pager: [phone]  GI ATTENDING  History, laboratories, x-rays, prior endoscopy reports reviewed. Patient comprehensive consultation note as outlined above. 76 year old with multiple significant medical problems including oxygen requiring COPD, advanced  renal disease, and heart failure. She presents now with symptomatic iron deficiency anemia. Known AVMs in the upper and lower GI tract. No melena or hematochezia. I suspect that she has had chronic GI blood loss over time from the AVMs given iron deficiency. She is very high risk for endoscopic intervention. As such, this point I would recommend transfusing to hemoglobin of 8. I would also recommend IV iron infusion while she is inpatient. Over time her hemoglobin and ferritin levels can be closely monitored (by hematology). She could receive  periodic transfusion and iron infusion as needed. If she were to develop significant refractory anemia despite these measures or subacute/acute GI bleeding then high risk therapeutic endoscopy could be considered. However, no plans at this time. We will follow. Thank you.  Docia Chuck. Geri Seminole., M.D. [facility] Division of Gastroenterology

## 2017-08-09 NOTE — ED Notes (Signed)
Bruising/skin tear noted to the left arm from blood pressure cuff. Blood pressure cuff has been removed from the area and replaced with a small blood pressure coff on the lower left arm.

## 2017-08-09 NOTE — Progress Notes (Signed)
I have seen and assessed patient and agree with Dr. Meryl Crutch assessment and plan.  Patient is a 77 year old female history of COPD, gout, CHF, renal insufficiency presenting to the ED with a 5-week history of lower extremity edema, fatigue, shortness of breath.  Patient went to see PCP and was noted to be severely anemic and sent to the ED.  In the emergency room patient was noted to have a hemoglobin as low as 5.6.  Patient transfused 2 units packed red blood cells.  Patient placed on IV Lasix.  Will consult with GI for further evaluation and management.  No charge.

## 2017-08-09 NOTE — H&P (Signed)
[facility] History and Physical  [name] XNA:[id number] DOB: March 01, 1941 DOA: 08/08/2017  Referring physician:  PCP: [name], MD   Chief Complaint: "I'm short of breath."  HPI: [name] is a 77 y.o. female past medical history of heart failure, renal insufficiency, COPD and gout presents the emergency room with chief complaint of abnormal labs.  Patient states that she has been having swelling her legs and fatigue for roughly 5 weeks.  Seen her primary care doctor.  Chest x-ray was clear.  No blood work at that time.  Discharge home.  Her Lasix was changed to torsemide.  Very little results.  Patient returned.  Felt no better.  Lab work checked.  Patient on her ride home was notified of low hemoglobin advised to come to the emergency room.  ED course: Repeat lab work confirmed low hemoglobin.  Imaging showed pulmonary edema.  Blood was ordered.  Hospitalist consulted for admission.   Review of Systems:  As per HPI otherwise 10 point review of systems negative.    Past Medical History:  Diagnosis Date  . CHF (congestive heart failure) ([place]) 08/31/03  . Colonic polyp 02/16/2008   Tubular adenoma  . COPD (chronic obstructive pulmonary disease) ([place]) 01/27/98  . GERD (gastroesophageal reflux disease) 07/30/92  . Gout   . HTN (hypertension) 07/30/1988  . Hyperlipidemia 11/27/96  . Kidney stone 1960, 1972, 1991  . Renal insufficiency   . Ulcerative colitis    Past Surgical History:  Procedure Laterality Date  . BRONCHOSCOPY    . COLONOSCOPY WITH PROPOFOL N/A 02/23/2015   Procedure: COLONOSCOPY WITH PROPOFOL;  Surgeon: Ladene Artist, MD;  Location: [facility];  Service: Endoscopy;  Laterality: N/A;  . ESOPHAGOGASTRODUODENOSCOPY (EGD) WITH PROPOFOL N/A 02/23/2015   Procedure: ESOPHAGOGASTRODUODENOSCOPY (EGD) WITH PROPOFOL;  Surgeon: Ladene Artist, MD;  Location: [facility];  Service: Endoscopy;  Laterality: N/A;  . NO PAST SURGERIES     Social History:   reports that she quit smoking about 21 years ago. she has never used smokeless tobacco. She reports that she does not drink alcohol or use drugs.  Allergies  Allergen Reactions  . Celebrex [Celecoxib]     edema  . Enalapril Cough  . Lipitor [Atorvastatin]     Muscle aches  . Amlodipine Besylate     REACTION: edema  . Amoxicillin     REACTION: Diarrhea  . Codeine Sulfate     REACTION: Nausea  . Hydrocodone-Acetaminophen     REACTION: Nausea    Family History  Problem Relation Age of Onset  . Stroke Mother   . Diabetes Mother   . Kidney cancer Mother   . Stomach cancer Mother   . Heart disease Mother   . Stroke Father   . Heart disease Brother   . Heart disease Brother   . Crohn's disease Brother   . Heart disease Sister        CATH,STENT  . Clotting disorder Sister   . Cervical cancer Sister   . Lung cancer Sister   . Heart attack Sister   . Diabetes Sister   . Colon cancer Neg Hx      Prior to Admission medications   Medication Sig Start Date End Date Taking? Authorizing Provider  acetaminophen (TYLENOL) 325 MG tablet Take 650 mg by mouth every 6 (six) hours as needed for moderate pain, fever or headache.   Yes [provider]  albuterol (PROAIR HFA) 108 (90 Base) MCG/ACT inhaler Inhale 1-2  puffs into the lungs every 6 (six) hours as needed for wheezing or shortness of breath. 10/30/16  Yes [name], MD  albuterol (PROVENTIL) (2.5 MG/3ML) 0.083% nebulizer solution Take 3 mLs (2.5 mg total) by nebulization every 6 (six) hours as needed for wheezing or shortness of breath. 07/18/17  Yes [name], MD  allopurinol (ZYLOPRIM) 100 MG tablet TAKE 1 TABLET BY MOUTH EVERY DAY FOR GOUT 05/21/17  Yes [name], MD  aspirin 81 MG EC tablet Take 81 mg by mouth daily.     Yes [provider]  Azilsartan Medoxomil (EDARBI) 40 MG TABS Take 1 tablet by mouth daily. 08/08/17  Yes [name], MD  cyanocobalamin 2000 MCG tablet Take 1 tablet (2,000  mcg total) by mouth daily. Patient taking differently: Take 2,000 mcg by mouth every evening.  02/24/16  Yes [name], MD  Dietary Management Product (VASCULERA) TABS Take 1 tablet by mouth daily. 05/09/17  Yes [name], MD  ferrous sulfate 325 (65 FE) MG tablet Take 1 tablet (325 mg total) by mouth 2 (two) times daily with a meal. 02/24/16  Yes [name], MD  fluticasone furoate-vilanterol (BREO ELLIPTA) 100-25 MCG/INH AEPB Inhale 1 puff into the lungs daily. 07/18/17  Yes [name], MD  hydrocerin (EUCERIN) CREA Apply 1 application topically as directed. 11/11/16  Yes Sheikh, Omair Latif, DO  metolazone (ZAROXOLYN) 5 MG tablet Take 1 tablet (5 mg total) by mouth daily. 08/08/17  Yes [name], MD  metoprolol tartrate (LOPRESSOR) 50 MG tablet TAKE 1 TABLET BY MOUTH TWICE DAILY 07/24/17  Yes [name], MD  promethazine-dextromethorphan (PROMETHAZINE-DM) 6.25-15 MG/5ML syrup Take 5 mLs by mouth 4 (four) times daily as needed for cough. 07/18/17  Yes [provider]  torsemide (DEMADEX) 20 MG tablet Take 1 tablet (20 mg total) by mouth 2 (two) times daily. 07/18/17  Yes [name], MD  Vitamin D, Ergocalciferol, (DRISDOL) 50000 units CAPS capsule TAKE ONE CAPSULE BY MOUTH ONCE WEEKLY 08/09/16  Yes [name], MD   Physical Exam: Vitals:   08/08/17 2100 08/08/17 2141 08/09/17 0017 08/09/17 0018  BP: (!) 230/58 (!) 236/42 (!) 215/103 (!) 215/103  Pulse: 73  100 86  Resp: 19  20 (!) 24  Temp:    97.8 F (36.6 C)  TempSrc:    Oral  SpO2: 100%  96% 100%    Wt Readings from Last 3 Encounters:  08/08/17 98 kg (216 lb)  07/18/17 98.4 kg (217 lb)  11/21/16 101 kg (222 lb 12 oz)    General:  Appears calm and comfortable; A&Ox3 Eyes:  PERRL, EOMI, normal lids, iris ENT:  grossly normal hearing, lips & tongue Neck:  no LAD, masses or thyromegaly Cardiovascular:  RRR, no m/r/g. No LE edema.  Respiratory:  incr wob, decr air movement, ant exam  only Abdomen:  soft, ntnd Skin:  no rash or induration seen on limited exam Musculoskeletal:  grossly normal tone BUE/BLE Psychiatric:  grossly normal mood and affect, speech fluent and appropriate Neurologic:  CN 2-12 grossly intact, moves all extremities in coordinated fashion.          Labs on Admission:  Basic Metabolic Panel: Recent Labs  Lab 08/08/17 1622 08/08/17 2047  NA 139 137  K 3.9 3.5  CL 99 100*  CO2 34* 32  GLUCOSE 114* 103*  BUN 16 17  CREATININE 1.29* 1.31*  CALCIUM 9.8 9.2   Liver Function  Tests: Recent Labs  Lab 08/08/17 1622 08/08/17 2047  AST 12 17  ALT 7 11*  ALKPHOS 53 55  BILITOT 0.4 0.6  PROT 7.5 7.1  ALBUMIN 3.5 3.0*   No results for input(s): LIPASE, AMYLASE in the last 168 hours. No results for input(s): AMMONIA in the last 168 hours. CBC: Recent Labs  Lab 08/08/17 1622 08/08/17 2047  WBC 10.2 8.6  NEUTROABS 7.2  --   HGB 5.9 Repeated and verified X2.* 5.6*  HCT 19.5 Repeated and verified X2.* 20.0*  MCV 72.0* 76.3*  PLT 274.0 232   Cardiac Enzymes: Recent Labs  Lab 08/08/17 2051  TROPONINI <0.03    BNP (last 3 results) Recent Labs    11/08/16 1815 08/08/17 2051  BNP 117.3* 268.3*    ProBNP (last 3 results) Recent Labs    08/08/17 1622  PROBNP 304.0*     Serum creatinine: 1.31 mg/dL (H) 08/08/17 2047 Estimated creatinine clearance: 37.8 mL/min (A)  CBG: No results for input(s): GLUCAP in the last 168 hours.  Radiological Exams on Admission: Dg Chest 2 View  Result Date: 08/08/2017 CLINICAL DATA:  Shortness of breath and low hemoglobin. EXAM: CHEST  2 VIEW COMPARISON:  07/18/2017 FINDINGS: Lungs are adequately inflated demonstrate mild hazy prominence of the perihilar and bibasilar regions without significant change likely mild vascular congestion. Small bilateral pleural effusions unchanged to slightly worse. Mild stable cardiomegaly. Remainder of the exam is unchanged. IMPRESSION: Findings suggesting mild  CHF with slightly worse small bilateral pleural effusions. Electronically Signed   By: Marin Olp M.D.   On: 08/08/2017 19:42    EKG: Independently reviewed. NSR. No stemi.  Assessment/Plan Active Problems:   Pulmonary edema  HTN emergency Initial trop neg Will need transfusion prior to aggressive BP control ECHO tomorrow CXR in AM  Acute resp failure due to pulm edema Doing well Albuterol prn Will likely need combo of  bumex IV and Zaroxolyn for diuresis  Low Hgb Pt refused DRE Guaiac ordered 2u PRBCS ordered H/H recheck after transfusion EDP ordered lasix  Gout Cont allopurinol  COPD Cont Breo Prn albuterol  Hypertension Will address aggressively once blood complete  UC Denies exacerbation of sx  Code Status: FC  DVT Prophylaxis: SCD Family Communication: husband at bedside Disposition Plan: Pending Improvement  Status: sdu, inpt  Elwin Mocha, MD Family Medicine [facility] [email or web address] Password [email or web address]

## 2017-08-09 NOTE — ED Notes (Signed)
Pt placed in a hospital bed

## 2017-08-09 NOTE — Progress Notes (Signed)
  Echocardiogram 2D Echocardiogram with definity has been performed.  Darlina Sicilian M 08/09/2017, 10:55 AM

## 2017-08-10 ENCOUNTER — Encounter (HOSPITAL_COMMUNITY): Payer: Self-pay | Admitting: *Deleted

## 2017-08-10 ENCOUNTER — Inpatient Hospital Stay (HOSPITAL_COMMUNITY): Payer: Medicare Other

## 2017-08-10 DIAGNOSIS — I872 Venous insufficiency (chronic) (peripheral): Secondary | ICD-10-CM

## 2017-08-10 DIAGNOSIS — I5033 Acute on chronic diastolic (congestive) heart failure: Secondary | ICD-10-CM

## 2017-08-10 DIAGNOSIS — I5042 Chronic combined systolic (congestive) and diastolic (congestive) heart failure: Secondary | ICD-10-CM

## 2017-08-10 DIAGNOSIS — I161 Hypertensive emergency: Secondary | ICD-10-CM

## 2017-08-10 DIAGNOSIS — I5032 Chronic diastolic (congestive) heart failure: Secondary | ICD-10-CM

## 2017-08-10 DIAGNOSIS — K31819 Angiodysplasia of stomach and duodenum without bleeding: Secondary | ICD-10-CM

## 2017-08-10 DIAGNOSIS — D649 Anemia, unspecified: Secondary | ICD-10-CM | POA: Diagnosis present

## 2017-08-10 DIAGNOSIS — K5520 Angiodysplasia of colon without hemorrhage: Secondary | ICD-10-CM

## 2017-08-10 DIAGNOSIS — K922 Gastrointestinal hemorrhage, unspecified: Secondary | ICD-10-CM | POA: Diagnosis present

## 2017-08-10 DIAGNOSIS — R195 Other fecal abnormalities: Secondary | ICD-10-CM

## 2017-08-10 DIAGNOSIS — N183 Chronic kidney disease, stage 3 (moderate): Secondary | ICD-10-CM

## 2017-08-10 DIAGNOSIS — I160 Hypertensive urgency: Secondary | ICD-10-CM | POA: Diagnosis present

## 2017-08-10 DIAGNOSIS — E785 Hyperlipidemia, unspecified: Secondary | ICD-10-CM

## 2017-08-10 DIAGNOSIS — D500 Iron deficiency anemia secondary to blood loss (chronic): Secondary | ICD-10-CM

## 2017-08-10 DIAGNOSIS — D509 Iron deficiency anemia, unspecified: Secondary | ICD-10-CM

## 2017-08-10 HISTORY — DX: Chronic combined systolic (congestive) and diastolic (congestive) heart failure: I50.42

## 2017-08-10 LAB — CULTURE, URINE COMPREHENSIVE
MICRO NUMBER: 90040688
SPECIMEN QUALITY:: ADEQUATE

## 2017-08-10 LAB — BASIC METABOLIC PANEL
ANION GAP: 6 (ref 5–15)
BUN: 17 mg/dL (ref 6–20)
CALCIUM: 9.5 mg/dL (ref 8.9–10.3)
CHLORIDE: 101 mmol/L (ref 101–111)
CO2: 31 mmol/L (ref 22–32)
Creatinine, Ser: 1.43 mg/dL — ABNORMAL HIGH (ref 0.44–1.00)
GFR calc non Af Amer: 34 mL/min — ABNORMAL LOW (ref >60)
GFR, EST AFRICAN AMERICAN: 40 mL/min — AB (ref >60)
Glucose, Bld: 94 mg/dL (ref 65–99)
Potassium: 4 mmol/L (ref 3.5–5.1)
Sodium: 138 mmol/L (ref 135–145)

## 2017-08-10 LAB — CBC
HEMATOCRIT: 26.6 % — AB (ref 36.0–46.0)
HEMOGLOBIN: 7.9 g/dL — AB (ref 12.0–15.0)
MCH: 22.6 pg — ABNORMAL LOW (ref 26.0–34.0)
MCHC: 29.7 g/dL — ABNORMAL LOW (ref 30.0–36.0)
MCV: 76.2 fL — ABNORMAL LOW (ref 78.0–100.0)
Platelets: 225 10*3/uL (ref 150–400)
RBC: 3.49 MIL/uL — ABNORMAL LOW (ref 3.87–5.11)
RDW: 20.8 % — ABNORMAL HIGH (ref 11.5–15.5)
WBC: 9.8 10*3/uL (ref 4.0–10.5)

## 2017-08-10 LAB — PREPARE RBC (CROSSMATCH)

## 2017-08-10 LAB — MAGNESIUM: Magnesium: 2.2 mg/dL (ref 1.7–2.4)

## 2017-08-10 MED ORDER — FUROSEMIDE 10 MG/ML IJ SOLN
60.0000 mg | Freq: Two times a day (BID) | INTRAMUSCULAR | Status: DC
  Administered 2017-08-10: 60 mg via INTRAVENOUS
  Filled 2017-08-10: qty 6

## 2017-08-10 MED ORDER — ACETAMINOPHEN 325 MG PO TABS
650.0000 mg | ORAL_TABLET | Freq: Once | ORAL | Status: AC
  Administered 2017-08-10: 650 mg via ORAL
  Filled 2017-08-10: qty 2

## 2017-08-10 MED ORDER — METOLAZONE 5 MG PO TABS
5.0000 mg | ORAL_TABLET | Freq: Every day | ORAL | Status: DC
  Administered 2017-08-10 – 2017-08-12 (×3): 5 mg via ORAL
  Filled 2017-08-10 (×3): qty 1

## 2017-08-10 MED ORDER — LABETALOL HCL 5 MG/ML IV SOLN
20.0000 mg | Freq: Once | INTRAVENOUS | Status: AC
  Administered 2017-08-10: 20 mg via INTRAVENOUS
  Filled 2017-08-10 (×2): qty 4

## 2017-08-10 MED ORDER — ISOSORB DINITRATE-HYDRALAZINE 20-37.5 MG PO TABS
2.0000 | ORAL_TABLET | Freq: Three times a day (TID) | ORAL | Status: DC
  Administered 2017-08-10 – 2017-08-11 (×2): 2 via ORAL
  Filled 2017-08-10 (×4): qty 2

## 2017-08-10 MED ORDER — DIPHENHYDRAMINE HCL 25 MG PO CAPS
25.0000 mg | ORAL_CAPSULE | Freq: Once | ORAL | Status: AC
  Administered 2017-08-10: 25 mg via ORAL
  Filled 2017-08-10: qty 1

## 2017-08-10 MED ORDER — HYDRALAZINE HCL 20 MG/ML IJ SOLN: 10.0000 mg | INTRAMUSCULAR | Status: DC | PRN

## 2017-08-10 MED ORDER — FUROSEMIDE 10 MG/ML IJ SOLN
80.0000 mg | Freq: Three times a day (TID) | INTRAMUSCULAR | Status: DC
  Administered 2017-08-10 – 2017-08-12 (×4): 80 mg via INTRAVENOUS
  Filled 2017-08-10 (×4): qty 8

## 2017-08-10 MED ORDER — ACETAMINOPHEN 325 MG PO TABS: 650.0000 mg | ORAL_TABLET | Freq: Four times a day (QID) | ORAL | Status: DC | PRN

## 2017-08-10 MED ORDER — SODIUM CHLORIDE 0.9 % IV SOLN
Freq: Once | INTRAVENOUS | Status: AC
  Administered 2017-08-10: 17:00:00 via INTRAVENOUS

## 2017-08-10 MED ORDER — HYDRALAZINE HCL 50 MG PO TABS: 50.0000 mg | ORAL_TABLET | Freq: Three times a day (TID) | ORAL | Status: DC

## 2017-08-10 MED ORDER — SODIUM CHLORIDE 0.9 % IV SOLN
510.0000 mg | Freq: Once | INTRAVENOUS | Status: AC
  Administered 2017-08-10: 510 mg via INTRAVENOUS
  Filled 2017-08-10: qty 17

## 2017-08-10 MED ORDER — FUROSEMIDE 10 MG/ML IJ SOLN
20.0000 mg | Freq: Once | INTRAMUSCULAR | Status: AC
  Administered 2017-08-11: 20 mg via INTRAVENOUS
  Filled 2017-08-10: qty 2

## 2017-08-10 NOTE — Progress Notes (Signed)
PROGRESS NOTE    [name]  SNK:[id number] DOB: January 12, 1941 DOA: 08/08/2017 PCP: [name], MD    Brief Narrative:  [name] is a 77 y.o. female past medical history of heart failure, renal insufficiency, COPD and gout presents the emergency room with chief complaint of abnormal labs.  Patient states that she has been having swelling her legs and fatigue for roughly 5 weeks.  Seen her primary care doctor.  Chest x-ray was clear.  No blood work at that time.  Discharge home.  Her Lasix was changed to torsemide.  Very little results.  Patient returned.  Felt no better.  Lab work checked.  Patient on her ride home was notified of low hemoglobin advised to come to the emergency room.  ED course: Repeat lab work confirmed low hemoglobin.  Imaging showed pulmonary edema.  Blood was ordered.  Hospitalist consulted for admission.      Assessment & Plan:   Principal Problem:   Pulmonary edema Active Problems:   Symptomatic anemia   Hypertensive emergency   Hyperlipidemia with target LDL less than 100   Morbid obesity (HCC)   HTN (hypertension)   GERD   CKD (chronic kidney disease), stage III (HCC)   Chronic venous stasis dermatitis of both lower extremities   Anemia, iron deficiency   Grade III diastolic dysfunction   Angiodysplasia of cecum   Angiodysplasia of duodenum   CHF exacerbation (HCC)   GI bleed  #1 pulmonary edema/acute on chronic diastolic CHF exacerbation Patient had presented with worsening shortness of breath on exertion noted to have lower extremity edema.  BNP on admission was elevated at 268.3.  Chest x-ray with bilateral pleural effusions.  Check enzymes negative x3.  2D echo with normal EF, no wall motion abnormalities, diastolic dysfunction.  Patient also noted to be hypertensive with systolic blood pressures in the 190s-200s.  Patient with some clinical improvement.  Urine output noted over the past 24 hours has been 600 cc.  Weight not recorded.   Increase Lasix 60 mg IV every 12 hours.  Strict I's and O's.  Daily weights.  Repeat chest x-ray.  Continue Lopressor.  Follow.  2.  Symptomatic anemia/GI bleed/iron deficiency anemia Likely secondary to AVMs per gastroenterology.  Patient with no overt significant bleeding.  She is status post transfusion of 2 units packed red blood cells 08/09/2017.  AMI panel consistent with iron deficiency anemia.  Hemoglobin currently at 7.9 from 8.4 from 6 from 5.6 on admission.  Will transfuse another 2 units packed red blood cells.  Transfusion threshold hemoglobin less than 8.  Patient has been assessed by GI.  Per GI patient with known AVMs in the upper and lower GI tract.  Patient with no overt melena or hematochezia.  Is felt patient symptomatic anemia secondary to chronic GI blood loss over time from AVMs given patient's iron deficiency.  It is noted that patient is very high risk for endoscopic intervention and as such GI recommending periodic transfusions and iron infusion as needed.  Per GI if patient develops significant refractory anemia despite these measures then high risk therapeutic endoscopy could be considered.  We will follow for now.  Transfuse 2 units packed red blood cells.  IV Feraheme x1.  Follow H&H.  Appreciate gastroenterology input and recommendations.  3.  Hypertensive emergency Patient noted with systolic blood pressures in the 190s-200s.  Patient currently on Lasix 40 mg IV every 12 hours, Lopressor 50 mg p.o. twice daily and labetalol 25  mg p.o. every 8 hours.  Continue and titrate as needed for better blood pressure control.  4.  Chronic kidney disease stage III Stable.  5.  Obesity  6.  COPD on home O2 Currently stable.  Continue Breo Ellipta.  Nebs as needed.  7.  Gout Continue allopurinol.  8.  History of ulcerative colitis Currently stable.  GI following.  Outpatient follow-up.   DVT prophylaxis: SCDs Code Status: DNR Family Communication: Updated patient.  No  family at bedside. Disposition Plan: To be determined.   Consultants:   Neurology: Dr. Henrene Pastor 08/09/2017  Procedures:  2D echo 08/09/2017  Chest x-ray 08/09/2017, 08/08/2017  Transfused 2 units packed red blood cells 08/09/2017  Antimicrobials:   None   Subjective: Patient still states some shortness of breath however some improvement since admission.  Patient with some bilateral chest discomfort which he thinks may be from coughing.  No overt bleeding.  Patient does endorse fatigue.  Objective: Vitals:   08/10/17 0800 08/10/17 0900 08/10/17 1033 08/10/17 1035  BP: (!) 200/62 (!) 189/54 (!) 196/60 (!) 194/44  Pulse: 77 80 82 83  Resp: 20 (!) 21 20 20   Temp:      TempSrc:      SpO2: 97% 94% 96% 98%    Intake/Output Summary (Last 24 hours) at 08/10/2017 1045 Last data filed at 08/09/2017 2324 Gross per 24 hour  Intake 503 ml  Output 600 ml  Net -97 ml   There were no vitals filed for this visit.  Examination:  General exam: Appears calm and comfortable  Respiratory system: Some diffuse crackles.  No wheezing.  No rhonchi.  Fair air movement. Decreased BS in bases.  Respiratory effort normal. Cardiovascular system: S1 & S2 heard, RRR. No JVD, murmurs, rubs, gallops or clicks.  Trace to 1+ bilateral lower extremity edema.  Gastrointestinal system: Abdomen is nondistended, soft and nontender. No organomegaly or masses felt. Normal bowel sounds heard. Central nervous system: Alert and oriented. No focal neurological deficits. Extremities: Symmetric 5 x 5 power. Skin: Bilateral lower extremity venous stasis changes.  Psychiatry: Judgement and insight appear normal. Mood & affect appropriate.     Data Reviewed: I have personally reviewed following labs and imaging studies  CBC: Recent Labs  Lab 08/08/17 1622 08/08/17 2047 08/09/17 0306 08/09/17 1036 08/10/17 0518  WBC 10.2 8.6 7.9  --  9.8  NEUTROABS 7.2  --   --   --   --   HGB 5.9 Repeated and verified X2.* 5.6*  6.0* 8.4* 7.9*  HCT 19.5 Repeated and verified X2.* 20.0* 20.5* 28.9* 26.6*  MCV 72.0* 76.3* 77.4*  --  76.2*  PLT 274.0 232 209  --  378   Basic Metabolic Panel: Recent Labs  Lab 08/08/17 1622 08/08/17 2047 08/09/17 0306 08/10/17 0518  NA 139 137 138 138  K 3.9 3.5 3.4* 4.0  CL 99 100* 101 101  CO2 34* 32 29 31  GLUCOSE 114* 103* 100* 94  BUN 16 17 16 17   CREATININE 1.29* 1.31* 1.31* 1.43*  CALCIUM 9.8 9.2 9.1 9.5  MG  --   --   --  2.2   GFR: Estimated Creatinine Clearance: 34.6 mL/min (A) (by C-G formula based on SCr of 1.43 mg/dL (H)). Liver Function Tests: Recent Labs  Lab 08/08/17 1622 08/08/17 2047  AST 12 17  ALT 7 11*  ALKPHOS 53 55  BILITOT 0.4 0.6  PROT 7.5 7.1  ALBUMIN 3.5 3.0*   No results for input(s): LIPASE,  AMYLASE in the last 168 hours. No results for input(s): AMMONIA in the last 168 hours. Coagulation Profile: Recent Labs  Lab 08/08/17 2051  INR 1.07   Cardiac Enzymes: Recent Labs  Lab 08/08/17 2051 08/09/17 0247 08/09/17 0855  TROPONINI <0.03 <0.03 <0.03   BNP (last 3 results) Recent Labs    08/08/17 1622  PROBNP 304.0*   HbA1C: No results for input(s): HGBA1C in the last 72 hours. CBG: No results for input(s): GLUCAP in the last 168 hours. Lipid Profile: No results for input(s): CHOL, HDL, LDLCALC, TRIG, CHOLHDL, LDLDIRECT in the last 72 hours. Thyroid Function Tests: Recent Labs    08/08/17 1622  TSH 3.22  T4TOTAL 10.2   Anemia Panel: Recent Labs    08/08/17 1622  FERRITIN 16.7  IRON 15*   Sepsis Labs: No results for input(s): PROCALCITON, LATICACIDVEN in the last 168 hours.  Recent Results (from the past 240 hour(s))  CULTURE, URINE COMPREHENSIVE     Status: Abnormal   Collection Time: 08/08/17  4:22 PM  Result Value Ref Range Status   MICRO NUMBER: [id number]  Final   SPECIMEN QUALITY: ADEQUATE  Final   Source URINE  Final   STATUS: FINAL  Final   ISOLATE 1: Lactobacillus species (A)  Final          Radiology Studies: Dg Chest 2 View  Result Date: 08/08/2017 CLINICAL DATA:  Shortness of breath and low hemoglobin. EXAM: CHEST  2 VIEW COMPARISON:  07/18/2017 FINDINGS: Lungs are adequately inflated demonstrate mild hazy prominence of the perihilar and bibasilar regions without significant change likely mild vascular congestion. Small bilateral pleural effusions unchanged to slightly worse. Mild stable cardiomegaly. Remainder of the exam is unchanged. IMPRESSION: Findings suggesting mild CHF with slightly worse small bilateral pleural effusions. Electronically Signed   By: Marin Olp M.D.   On: 08/08/2017 19:42   Dg Chest Port 1 View  Result Date: 08/09/2017 CLINICAL DATA:  77 year old female with CHF. EXAM: PORTABLE CHEST 1 VIEW COMPARISON:  Chest radiograph dated 08/08/2017. FINDINGS: Small bilateral effusions with associated atelectatic changes of the lung bases, grossly similar to prior radiograph. Pneumonia is not excluded. Clinical correlation is recommended. No pneumothorax. Stable mild cardiomegaly. Atherosclerotic calcification of the aorta. No acute osseous pathology. IMPRESSION: No significant change in the bilateral pleural effusions are atelectatic changes of the lung bases. Clinical correlation and follow-up recommended. Electronically Signed   By: Anner Crete M.D.   On: 08/09/2017 05:33        Scheduled Meds: . acetaminophen  650 mg Oral Once  . allopurinol  100 mg Oral Daily  . diphenhydrAMINE  25 mg Oral Once  . famotidine  20 mg Oral BID  . fluticasone furoate-vilanterol  1 puff Inhalation Daily  . furosemide  20 mg Intravenous Once  . furosemide  40 mg Intravenous Q12H  . hydrALAZINE  25 mg Oral Q8H  . metoprolol tartrate  50 mg Oral BID  . sodium chloride flush  3 mL Intravenous Q12H  . cyanocobalamin  2,000 mcg Oral Daily   Continuous Infusions: . sodium chloride    . ferumoxytol       LOS: 2 days    Time spent: 40 minutes    Irine Seal, MD [facility] Pager [phone]  If 7PM-7AM, please contact night-coverage [email or web address] Password [facility] 08/10/2017, 10:45 AM

## 2017-08-10 NOTE — Progress Notes (Signed)
VS now are bp 158/36 and temp orally is down to 99.  However premeds Benedryl, tylenol and lasix were given at 1700 I have notified [facility] on call who orders repeat Tylenol and Benedryl but not the pre-lasix and go ahead and start transfusion.

## 2017-08-10 NOTE — Progress Notes (Signed)
OT Cancellation Note  Patient Details Name: [name] MRN: [id number] DOB: 03-08-1941   Cancelled Treatment:    Reason Eval/Treat Not Completed: Medical issues which prohibited therapy. Pt is getting blood. Will reattempt tomorrow as schedule permits.  Aerik Polan 08/10/2017, 1:45 PM  Lesle Chris, OTR/L [phone] 08/10/2017

## 2017-08-10 NOTE — ED Notes (Signed)
ED TO INPATIENT HANDOFF REPORT  Name/Age/Gender [name] 77 y.o. female  Code Status    Code Status Orders  (From admission, onward)        Start     Ordered   08/09/17 0040  Do not attempt resuscitation (DNR)  Continuous    Question Answer Comment  In the event of cardiac or respiratory ARREST Do not call a "code blue"   In the event of cardiac or respiratory ARREST Do not perform Intubation, CPR, defibrillation or ACLS   In the event of cardiac or respiratory ARREST Use medication by any route, position, wound care, and other measures to relive pain and suffering. May use oxygen, suction and manual treatment of airway obstruction as needed for comfort.      08/09/17 0040    Code Status History    Date Active Date Inactive Code Status Order ID Comments User Context   11/08/2016 20:26 11/11/2016 20:01 Partial Code [id number]  Vianne Bulls, MD ED   03/19/2016 21:09 03/22/2016 21:57 Partial Code [id number]  Toy Baker, MD Inpatient   11/16/2014 15:24 11/23/2014 16:38 DNR [id number]  Samella Parr, NP Inpatient   06/01/2013 20:01 06/05/2013 15:02 Full Code [id number]  Aldean Jewett, MD ED      Home/SNF/Other Home  Chief Complaint need blood transfusion  Level of Care/Admitting Diagnosis ED Disposition    ED Disposition Condition [facility] Area: [facility] [[id number]]  Level of Care: Telemetry [5]  Admit to tele based on following criteria: Acute CHF  Diagnosis: CHF exacerbation [facility]) [[id number]]  Admitting Physician: Eugenie Filler [[id number]]  Attending Physician: Eugenie Filler [[id number]]  Estimated length of stay: past midnight tomorrow  Certification:: I certify this patient will need inpatient services for at least 2 midnights  PT Class (Do Not Modify): Inpatient [101]  PT Acc Code (Do Not Modify): Private [1]       Medical History Past Medical History:  Diagnosis Date  . CHF (congestive heart failure) ([place])  08/31/03  . Colonic polyp 02/16/2008   Tubular adenoma  . COPD (chronic obstructive pulmonary disease) ([place]) 01/27/98  . GERD (gastroesophageal reflux disease) 07/30/92  . Gout   . HTN (hypertension) 07/30/1988  . Hyperlipidemia 11/27/96  . Kidney stone 1960, 1972, 1991  . Renal insufficiency   . Ulcerative colitis     Allergies Allergies  Allergen Reactions  . Celebrex [Celecoxib]     edema  . Enalapril Cough  . Lipitor [Atorvastatin]     Muscle aches  . Amlodipine Besylate     REACTION: edema  . Amoxicillin     REACTION: Diarrhea  . Codeine Sulfate     REACTION: Nausea  . Hydrocodone-Acetaminophen     REACTION: Nausea    IV Location/Drains/Wounds Patient Lines/Drains/Airways Status   Active Line/Drains/Airways    Name:   Placement date:   Placement time:   Site:   Days:   Peripheral IV 08/08/17 Right Antecubital   08/08/17    2049    Antecubital   2   Peripheral IV 08/09/17 Right;Anterior Forearm   08/09/17    0307    Forearm   1   Wound 06/01/13 Other (Comment) Leg Right;Left;Anterior min drainage   06/01/13    2200    Leg   1531   Wound / Incision (Open or Dehisced) 11/22/14 Puncture Abdomen Right;Left;Mid bleeding from heparin injection sites   11/22/14    1000    Abdomen  992   Wound / Incision (Open or Dehisced) 11/08/16 Other (Comment) Leg Right;Left cellulist/dermatitis   11/08/16    2330    Leg   275          Labs/Imaging Results for orders placed or performed during the hospital encounter of 08/08/17 (from the past 48 hour(s))  Comprehensive metabolic panel     Status: Abnormal   Collection Time: 08/08/17  8:47 PM  Result Value Ref Range   Sodium 137 135 - 145 mmol/L   Potassium 3.5 3.5 - 5.1 mmol/L   Chloride 100 (L) 101 - 111 mmol/L   CO2 32 22 - 32 mmol/L   Glucose, Bld 103 (H) 65 - 99 mg/dL   BUN 17 6 - 20 mg/dL   Creatinine, Ser 1.31 (H) 0.44 - 1.00 mg/dL   Calcium 9.2 8.9 - 10.3 mg/dL   Total Protein 7.1 6.5 - 8.1 g/dL   Albumin 3.0 (L) 3.5 - 5.0  g/dL   AST 17 15 - 41 U/L   ALT 11 (L) 14 - 54 U/L   Alkaline Phosphatase 55 38 - 126 U/L   Total Bilirubin 0.6 0.3 - 1.2 mg/dL   GFR calc non Af Amer 38 (L) >60 mL/min   GFR calc Af Amer 44 (L) >60 mL/min    Comment: (NOTE) The eGFR has been calculated using the CKD EPI equation. This calculation has not been validated in all clinical situations. eGFR's persistently <60 mL/min signify possible Chronic Kidney Disease.    Anion gap 5 5 - 15  CBC     Status: Abnormal   Collection Time: 08/08/17  8:47 PM  Result Value Ref Range   WBC 8.6 4.0 - 10.5 K/uL   RBC 2.62 (L) 3.87 - 5.11 MIL/uL   Hemoglobin 5.6 (LL) 12.0 - 15.0 g/dL    Comment: REPEATED TO VERIFY CRITICAL RESULT CALLED TO, READ BACK BY AND VERIFIED WITH: Erasmo Score [id number] @ 2125 BY J SCOTTON    HCT 20.0 (L) 36.0 - 46.0 %   MCV 76.3 (L) 78.0 - 100.0 fL   MCH 21.4 (L) 26.0 - 34.0 pg   MCHC 28.0 (L) 30.0 - 36.0 g/dL   RDW 19.8 (H) 11.5 - 15.5 %   Platelets 232 150 - 400 K/uL  Brain natriuretic peptide     Status: Abnormal   Collection Time: 08/08/17  8:51 PM  Result Value Ref Range   B Natriuretic Peptide 268.3 (H) 0.0 - 100.0 pg/mL  Troponin I     Status: None   Collection Time: 08/08/17  8:51 PM  Result Value Ref Range   Troponin I <0.03 <0.03 ng/mL  Protime-INR     Status: None   Collection Time: 08/08/17  8:51 PM  Result Value Ref Range   Prothrombin Time 13.8 11.4 - 15.2 seconds   INR 1.07   Type and screen [place]     Status: None   Collection Time: 08/08/17  8:53 PM  Result Value Ref Range   ABO/RH(D) O POS    Antibody Screen POS    Sample Expiration 08/11/2017    Antibody Identification ANTI K    PT AG Type NEGATIVE FOR KELL ANTIGEN    Unit Number [id number]    Blood Component Type RED CELLS,LR    Unit division 00    Status of Unit ISSUED,FINAL    Donor AG Type NEGATIVE FOR KELL ANTIGEN    Transfusion Status OK TO TRANSFUSE    Crossmatch  Result COMPATIBLE    Unit  Number [id number]    Blood Component Type RED CELLS,LR    Unit division 00    Status of Unit ISSUED,FINAL    Donor AG Type NEGATIVE FOR KELL ANTIGEN    Transfusion Status OK TO TRANSFUSE    Crossmatch Result COMPATIBLE   Prepare RBC     Status: None   Collection Time: 08/08/17  9:34 PM  Result Value Ref Range   Order Confirmation ORDER PROCESSED BY BLOOD BANK   Troponin I (q 6hr x 3)     Status: None   Collection Time: 08/09/17  2:47 AM  Result Value Ref Range   Troponin I <0.03 <0.03 ng/mL  Basic metabolic panel     Status: Abnormal   Collection Time: 08/09/17  3:06 AM  Result Value Ref Range   Sodium 138 135 - 145 mmol/L   Potassium 3.4 (L) 3.5 - 5.1 mmol/L   Chloride 101 101 - 111 mmol/L   CO2 29 22 - 32 mmol/L   Glucose, Bld 100 (H) 65 - 99 mg/dL   BUN 16 6 - 20 mg/dL   Creatinine, Ser 1.31 (H) 0.44 - 1.00 mg/dL   Calcium 9.1 8.9 - 10.3 mg/dL   GFR calc non Af Amer 38 (L) >60 mL/min   GFR calc Af Amer 44 (L) >60 mL/min    Comment: (NOTE) The eGFR has been calculated using the CKD EPI equation. This calculation has not been validated in all clinical situations. eGFR's persistently <60 mL/min signify possible Chronic Kidney Disease.    Anion gap 8 5 - 15  CBC     Status: Abnormal   Collection Time: 08/09/17  3:06 AM  Result Value Ref Range   WBC 7.9 4.0 - 10.5 K/uL   RBC 2.65 (L) 3.87 - 5.11 MIL/uL   Hemoglobin 6.0 (LL) 12.0 - 15.0 g/dL    Comment: CRITICAL VALUE NOTED.  VALUE IS CONSISTENT WITH PREVIOUSLY REPORTED AND CALLED VALUE.   HCT 20.5 (L) 36.0 - 46.0 %   MCV 77.4 (L) 78.0 - 100.0 fL   MCH 22.6 (L) 26.0 - 34.0 pg   MCHC 29.3 (L) 30.0 - 36.0 g/dL   RDW 20.5 (H) 11.5 - 15.5 %   Platelets 209 150 - 400 K/uL  Troponin I (q 6hr x 3)     Status: None   Collection Time: 08/09/17  8:55 AM  Result Value Ref Range   Troponin I <0.03 <0.03 ng/mL  Hemoglobin and hematocrit, blood     Status: Abnormal   Collection Time: 08/09/17 10:36 AM  Result Value Ref Range    Hemoglobin 8.4 (L) 12.0 - 15.0 g/dL    Comment: REPEATED TO VERIFY DELTA CHECK NOTED POST TRANSFUSION SPECIMEN    HCT 28.9 (L) 36.0 - 46.0 %  CBC     Status: Abnormal   Collection Time: 08/10/17  5:18 AM  Result Value Ref Range   WBC 9.8 4.0 - 10.5 K/uL   RBC 3.49 (L) 3.87 - 5.11 MIL/uL   Hemoglobin 7.9 (L) 12.0 - 15.0 g/dL   HCT 26.6 (L) 36.0 - 46.0 %   MCV 76.2 (L) 78.0 - 100.0 fL   MCH 22.6 (L) 26.0 - 34.0 pg   MCHC 29.7 (L) 30.0 - 36.0 g/dL   RDW 20.8 (H) 11.5 - 15.5 %   Platelets 225 150 - 400 K/uL  Basic metabolic panel     Status: Abnormal   Collection Time: 08/10/17  5:18 AM  Result Value Ref Range   Sodium 138 135 - 145 mmol/L   Potassium 4.0 3.5 - 5.1 mmol/L   Chloride 101 101 - 111 mmol/L   CO2 31 22 - 32 mmol/L   Glucose, Bld 94 65 - 99 mg/dL   BUN 17 6 - 20 mg/dL   Creatinine, Ser 1.43 (H) 0.44 - 1.00 mg/dL   Calcium 9.5 8.9 - 10.3 mg/dL   GFR calc non Af Amer 34 (L) >60 mL/min   GFR calc Af Amer 40 (L) >60 mL/min    Comment: (NOTE) The eGFR has been calculated using the CKD EPI equation. This calculation has not been validated in all clinical situations. eGFR's persistently <60 mL/min signify possible Chronic Kidney Disease.    Anion gap 6 5 - 15  Magnesium     Status: None   Collection Time: 08/10/17  5:18 AM  Result Value Ref Range   Magnesium 2.2 1.7 - 2.4 mg/dL   Dg Chest 2 View  Result Date: 08/08/2017 CLINICAL DATA:  Shortness of breath and low hemoglobin. EXAM: CHEST  2 VIEW COMPARISON:  07/18/2017 FINDINGS: Lungs are adequately inflated demonstrate mild hazy prominence of the perihilar and bibasilar regions without significant change likely mild vascular congestion. Small bilateral pleural effusions unchanged to slightly worse. Mild stable cardiomegaly. Remainder of the exam is unchanged. IMPRESSION: Findings suggesting mild CHF with slightly worse small bilateral pleural effusions. Electronically Signed   By: Marin Olp M.D.   On: 08/08/2017  19:42   Dg Chest Port 1 View  Result Date: 08/09/2017 CLINICAL DATA:  77 year old female with CHF. EXAM: PORTABLE CHEST 1 VIEW COMPARISON:  Chest radiograph dated 08/08/2017. FINDINGS: Small bilateral effusions with associated atelectatic changes of the lung bases, grossly similar to prior radiograph. Pneumonia is not excluded. Clinical correlation is recommended. No pneumothorax. Stable mild cardiomegaly. Atherosclerotic calcification of the aorta. No acute osseous pathology. IMPRESSION: No significant change in the bilateral pleural effusions are atelectatic changes of the lung bases. Clinical correlation and follow-up recommended. Electronically Signed   By: Anner Crete M.D.   On: 08/09/2017 05:33    Pending Labs Unresulted Labs (From admission, onward)   Start     Ordered   08/10/17 1004  Prepare RBC  (Adult Blood Administration - Red Blood Cells)  Once,   R    Question Answer Comment  # of Units 2 units   Transfusion Indications Symptomatic Anemia   Transfusion Indications Actively Bleeding / GI Bleed   If emergent release call blood bank [facility] [phone]      08/10/17 1004      Vitals/Pain Today's Vitals   08/10/17 0800 08/10/17 0900 08/10/17 1033 08/10/17 1035  BP: (!) 200/62 (!) 189/54 (!) 196/60 (!) 194/44  Pulse: 77 80 82 83  Resp: 20 (!) _0 Temp:      TempSrc:      SpO2: 97% 94% 96% 98%    Isolation Precautions No active isolations  Medications Medications  albuterol (PROVENTIL) (2.5 MG/3ML) 0.083% nebulizer solution 2.5 mg (not administered)  allopurinol (ZYLOPRIM) tablet 100 mg (100 mg Oral Given 08/09/17 1032)  vitamin B-12 (CYANOCOBALAMIN) tablet 2,000 mcg (2,000 mcg Oral Given 08/09/17 1032)  fluticasone furoate-vilanterol (BREO ELLIPTA) 100-25 MCG/INH 1 puff (1 puff Inhalation Not Given 08/10/17 1015)  sodium chloride flush (NS) 0.9 % injection 3 mL (3 mLs Intravenous Given 08/09/17 2320)  acetaminophen (TYLENOL) tablet 650 mg (650 mg Oral  Given 08/09/17 0419)    Or  acetaminophen (TYLENOL) suppository 650 mg ( Rectal See Alternative 08/09/17 0419)  ondansetron (ZOFRAN) tablet 4 mg (not administered)    Or  ondansetron (ZOFRAN) injection 4 mg (not administered)  furosemide (LASIX) injection 40 mg (40 mg Intravenous Given 08/10/17 1020)  metoprolol tartrate (LOPRESSOR) tablet 50 mg (50 mg Oral Given 08/10/17 1019)  perflutren lipid microspheres (DEFINITY) IV suspension (4 mLs Intravenous Given 08/09/17 1054)  hydrALAZINE (APRESOLINE) tablet 25 mg (25 mg Oral Given 08/10/17 0911)  famotidine (PEPCID) tablet 20 mg (20 mg Oral Given 08/09/17 2155)  ferumoxytol (FERAHEME) 510 mg in sodium chloride 0.9 % 100 mL IVPB (not administered)  0.9 %  sodium chloride infusion (not administered)  acetaminophen (TYLENOL) tablet 650 mg (not administered)  diphenhydrAMINE (BENADRYL) capsule 25 mg (not administered)  furosemide (LASIX) injection 20 mg (not administered)  0.9 %  sodium chloride infusion ( Intravenous Stopped 08/09/17 0854)  furosemide (LASIX) injection 40 mg (40 mg Intravenous Given 08/08/17 2205)  potassium chloride SA (K-DUR,KLOR-CON) CR tablet 40 mEq (40 mEq Oral Given 08/09/17 1032)    Mobility walks with device

## 2017-08-10 NOTE — Progress Notes (Signed)
Progress Note   Subjective  Chief Complaint: Acute on chronic IDA, history of GI AVMs, FOBT+  Patient reports that she is tired of laying in bed in the ER and is ready to get to a bed upstairs.  She denies or melena/hematochezia.  Overall she is better after being able to eat last night and denies any new complaints.  She does continue to feel fatigued and weak.   Objective   Vital signs in last 24 hours: Pulse Rate:  [61-96] 80 (01/12 0900) Resp:  [17-24] 21 (01/12 0900) BP: (141-222)/(49-97) 189/54 (01/12 0900) SpO2:  [93 %-100 %] 94 % (01/12 0900)   General:    Overweight, pale, white female in NAD Heart:  Regular rate and rhythm; no murmurs Lungs: Respirations even and unlabored, lungs CTA bilaterally Abdomen:  Soft, nontender and nondistended. Normal bowel sounds. Extremities:  Without edema. Neurologic:  Alert and oriented,  grossly normal neurologically. Psych:  Cooperative. Normal mood and affect.  Intake/Output from previous day: 01/11 0701 - 01/12 0700 In: 1214.7 [I.V.:584.7; Blood:630] Out: 600 [Urine:600]  Lab Results: Recent Labs    08/08/17 2047 08/09/17 0306 08/09/17 1036 08/10/17 0518  WBC 8.6 7.9  --  9.8  HGB 5.6* 6.0* 8.4* 7.9*  HCT 20.0* 20.5* 28.9* 26.6*  PLT 232 209  --  225   BMET Recent Labs    08/08/17 2047 08/09/17 0306 08/10/17 0518  NA 137 138 138  K 3.5 3.4* 4.0  CL 100* 101 101  CO2 32 29 31  GLUCOSE 103* 100* 94  BUN 17 16 17   CREATININE 1.31* 1.31* 1.43*  CALCIUM 9.2 9.1 9.5   LFT Recent Labs    08/08/17 2047  PROT 7.1  ALBUMIN 3.0*  AST 17  ALT 11*  ALKPHOS 55  BILITOT 0.6   PT/INR Recent Labs    08/08/17 2051  LABPROT 13.8  INR 1.07    Studies/Results: Dg Chest 2 View  Result Date: 08/08/2017 CLINICAL DATA:  Shortness of breath and low hemoglobin. EXAM: CHEST  2 VIEW COMPARISON:  07/18/2017 FINDINGS: Lungs are adequately inflated demonstrate mild hazy prominence of the perihilar and bibasilar regions  without significant change likely mild vascular congestion. Small bilateral pleural effusions unchanged to slightly worse. Mild stable cardiomegaly. Remainder of the exam is unchanged. IMPRESSION: Findings suggesting mild CHF with slightly worse small bilateral pleural effusions. Electronically Signed   By: Marin Olp M.D.   On: 08/08/2017 19:42   Dg Chest Port 1 View  Result Date: 08/09/2017 CLINICAL DATA:  77 year old female with CHF. EXAM: PORTABLE CHEST 1 VIEW COMPARISON:  Chest radiograph dated 08/08/2017. FINDINGS: Small bilateral effusions with associated atelectatic changes of the lung bases, grossly similar to prior radiograph. Pneumonia is not excluded. Clinical correlation is recommended. No pneumothorax. Stable mild cardiomegaly. Atherosclerotic calcification of the aorta. No acute osseous pathology. IMPRESSION: No significant change in the bilateral pleural effusions are atelectatic changes of the lung bases. Clinical correlation and follow-up recommended. Electronically Signed   By: Anner Crete M.D.   On: 08/09/2017 05:33       Assessment / Plan:    Assessment: 1.  Acute on chronic iron deficiency anemia: Patient on twice daily iron at home, though not for 10-days during abx tx  last month, status post 2 units PRBCs, hemoglobin is now at 7.9, per hospitalist plan for 2 more units today 2.  FOBT positive stool: Known upper and lower AVMs in the GI tract, long-standing 3.  CKD stage  III 4.  COPD  Plan: 1.  Per Dr. Henrene Pastor known AVMs in the upper and lower GI tract, no melena or hematochezia, we suspect this is chronic GI blood loss over time from AVMs given iron deficiency, she is very high risk for endoscopic procedures.  Therefore would recommend transfusing to hemoglobin of 8/above 8 and IV iron infusions while she is inpatient.  Again no plans for GI procedures at this time. 2.  Agree with 2 units PRBCs today per hospitalist 3.  Please await final recommendations from Dr.  Henrene Pastor later today Thank you for your kind consultation.   LOS: 2 days   Lavone Nian [facility]  08/10/2017, 10:03 AM Pager # [phone]  GI ATTENDING  Interval history and data reviewed. Patient personally seen and examined. She finally made it to a ward room. Husband at bedside. Still complains of fatigue and shortness of breath, but less. No bowel movements or overt bleeding. Hemoglobin stable at 7.9. RECOMMENDATIONS 1. Transfuse 2 clinically appropriate hemoglobin 2. Recommend inpatient iron infusion as she was iron deficient despite outpatient oral iron 3. No plans for endoscopic evaluations unless she develops significant transfusion dependent anemia or overt GI bleeding. Discussed with patient and husband 4. Diet of choice 5. Please arrange outpatient hematology evaluation. They can closely monitor her blood counts and provide as needed transfusions of blood and/or iron. 6. No routine GI follow-up outpatient required at this time 7. Decision on discharge per primary service  We're available for questions or problems. Will sign off. Thank you  Docia Chuck. Geri Seminole., M.D. [facility] Division of Gastroenterology

## 2017-08-10 NOTE — Progress Notes (Signed)
Pt BP 202/49, HR 76, Temp 100. MD notified. Will continue to monitor.

## 2017-08-11 LAB — BASIC METABOLIC PANEL
ANION GAP: 12 (ref 5–15)
BUN: 24 mg/dL — AB (ref 6–20)
CHLORIDE: 92 mmol/L — AB (ref 101–111)
CO2: 32 mmol/L (ref 22–32)
Calcium: 9.6 mg/dL (ref 8.9–10.3)
Creatinine, Ser: 1.6 mg/dL — ABNORMAL HIGH (ref 0.44–1.00)
GFR calc Af Amer: 35 mL/min — ABNORMAL LOW (ref >60)
GFR calc non Af Amer: 30 mL/min — ABNORMAL LOW (ref >60)
GLUCOSE: 111 mg/dL — AB (ref 65–99)
POTASSIUM: 3.3 mmol/L — AB (ref 3.5–5.1)
Sodium: 136 mmol/L (ref 135–145)

## 2017-08-11 LAB — MAGNESIUM: MAGNESIUM: 2.1 mg/dL (ref 1.7–2.4)

## 2017-08-11 LAB — CBC WITH DIFFERENTIAL/PLATELET
Basophils Absolute: 0 10*3/uL (ref 0.0–0.1)
Basophils Relative: 0 %
Eosinophils Absolute: 0.2 10*3/uL (ref 0.0–0.7)
Eosinophils Relative: 2 %
HEMATOCRIT: 35.5 % — AB (ref 36.0–46.0)
HEMOGLOBIN: 11.3 g/dL — AB (ref 12.0–15.0)
LYMPHS ABS: 1.1 10*3/uL (ref 0.7–4.0)
LYMPHS PCT: 10 %
MCH: 25.2 pg — AB (ref 26.0–34.0)
MCHC: 31.8 g/dL (ref 30.0–36.0)
MCV: 79.1 fL (ref 78.0–100.0)
Monocytes Absolute: 1.1 10*3/uL — ABNORMAL HIGH (ref 0.1–1.0)
Monocytes Relative: 10 %
NEUTROS ABS: 8.5 10*3/uL — AB (ref 1.7–7.7)
NEUTROS PCT: 78 %
Platelets: 214 10*3/uL (ref 150–400)
RBC: 4.49 MIL/uL (ref 3.87–5.11)
RDW: 20.8 % — ABNORMAL HIGH (ref 11.5–15.5)
WBC: 11 10*3/uL — AB (ref 4.0–10.5)

## 2017-08-11 LAB — HEMOGLOBIN AND HEMATOCRIT, BLOOD
HCT: 35.3 % — ABNORMAL LOW (ref 36.0–46.0)
HEMOGLOBIN: 11.1 g/dL — AB (ref 12.0–15.0)

## 2017-08-11 MED ORDER — METOPROLOL TARTRATE 25 MG PO TABS
25.0000 mg | ORAL_TABLET | Freq: Two times a day (BID) | ORAL | Status: DC
  Administered 2017-08-12 – 2017-08-15 (×7): 25 mg via ORAL
  Filled 2017-08-11 (×8): qty 1

## 2017-08-11 MED ORDER — ISOSORB DINITRATE-HYDRALAZINE 20-37.5 MG PO TABS
1.0000 | ORAL_TABLET | Freq: Three times a day (TID) | ORAL | Status: DC
  Administered 2017-08-12 – 2017-08-15 (×11): 1 via ORAL
  Filled 2017-08-11 (×13): qty 1

## 2017-08-11 MED ORDER — GABAPENTIN 100 MG PO CAPS
200.0000 mg | ORAL_CAPSULE | Freq: Three times a day (TID) | ORAL | Status: AC
  Administered 2017-08-11 (×3): 200 mg via ORAL
  Filled 2017-08-11 (×3): qty 2

## 2017-08-11 MED ORDER — POTASSIUM CHLORIDE CRYS ER 20 MEQ PO TBCR
40.0000 meq | EXTENDED_RELEASE_TABLET | Freq: Once | ORAL | Status: AC
  Administered 2017-08-11: 40 meq via ORAL
  Filled 2017-08-11: qty 2

## 2017-08-11 NOTE — Progress Notes (Signed)
Pt BP 100/38 manually, HR 60. MD notified. Will continue to monitor.

## 2017-08-11 NOTE — Evaluation (Signed)
Physical Therapy Evaluation Patient Details Name: [name] MRN: [id number] DOB: November 08, 1940 Today's Date: 08/11/2017   History of Present Illness  77 yo female admitted with pulmonary edema, CHF exacerbation. Hx of COPD-O2 dep, HF, gout, chronic LE swelling, HTN, CKD, venous stasis.     Clinical Impression  On eval, pt required Min assist for mobility. She was able to pivot to a recliner with use of a RW with great effort. She was unable to ambulate on today due to pain. Pt c/o 10/10 pain in both feet with weightbearing. Remained on [place] O2 during activity. Made RN aware of pain issues. Will follow and progress activity as tolerated. At this time, recommendation is for ST rehab at SNF, if pt is agreeable.  Should pt progress well with mobility, she may be able to d/c home with HHPT.     Follow Up Recommendations SNF    Equipment Recommendations  None recommended by PT    Recommendations for Other Services       Precautions / Restrictions Precautions Precautions: Fall Precaution Comments: O2 dep Restrictions Weight Bearing Restrictions: No      Mobility  Bed Mobility Overal bed mobility: Needs Assistance Bed Mobility: Supine to Sit     Supine to sit: Min guard;HOB elevated     General bed mobility comments: Increased time. Close guard for safety/lines.   Transfers Overall transfer level: Needs assistance Equipment used: Rolling walker (2 wheeled) Transfers: Sit to/from [facility] Sit to Stand: Min assist;From elevated surface Stand pivot transfers: Min assist       General transfer comment: Assist to rise, stabilize, control descent. VC safety, technique, hand placement. Increased time. Significant pain in both feet with standing. Stand pivot, bed to recliner, with RW.   Ambulation/Gait             General Gait Details: NT-pt with 10/10 in both feet when standing. She was barely able to pivot to recliner.   Stairs             Wheelchair Mobility    Modified Rankin (Stroke Patients Only)       Balance Overall balance assessment: Needs assistance         Standing balance support: Bilateral upper extremity supported Standing balance-Leahy Scale: Poor                               Pertinent Vitals/Pain Pain Assessment: 0-10 Pain Score: 10-Worst pain ever Pain Location: bil feet  Pain Descriptors / Indicators: Tender;Sore;Grimacing Pain Intervention(s): Limited activity within patient's tolerance;Repositioned    Home Living   Living Arrangements: Spouse/significant other;Children(disabled daughter) Available Help at Discharge: Family Type of Home: House Home Access: Stairs to enter   [employer] of Steps: 1-back. 4 -front Home Layout: One level Home Equipment: Cane - single point;Walker - 2 wheels;Bedside commode;Grab bars - tub/shower      Prior Function Level of Independence: Needs assistance   Gait / Transfers Assistance Needed: cane for ambulation  ADL's / Homemaking Assistance Needed: husband assisted with household tasks        Hand Dominance        Extremity/Trunk Assessment   Upper Extremity Assessment Upper Extremity Assessment: Defer to OT evaluation    Lower Extremity Assessment Lower Extremity Assessment: Generalized weakness    Cervical / Trunk Assessment Cervical / Trunk Assessment: Normal  Communication   Communication: No difficulties  Cognition Arousal/Alertness: Awake/alert Behavior During Therapy:  WFL for tasks assessed/performed Overall Cognitive Status: Within Functional Limits for tasks assessed                                        General Comments      Exercises     Assessment/Plan    PT Assessment Patient needs continued PT services  PT Problem List         PT Treatment Interventions DME instruction;Gait training;Functional mobility training;Balance training;Therapeutic  activities;Patient/family education;Therapeutic exercise    PT Goals (Current goals can be found in the Care Plan section)  Acute Rehab PT Goals Patient Stated Goal: less pain. home.  PT Goal Formulation: With patient Time For Goal Achievement: 08/25/17 Potential to Achieve Goals: Good    Frequency Min 3X/week   Barriers to discharge        Co-evaluation               AM-PAC PT "6 Clicks" Daily Activity  Outcome Measure Difficulty turning over in bed (including adjusting bedclothes, sheets and blankets)?: Unable Difficulty moving from lying on back to sitting on the side of the bed? : Unable Difficulty sitting down on and standing up from a chair with arms (e.g., wheelchair, bedside commode, etc,.)?: Unable Help needed moving to and from a bed to chair (including a wheelchair)?: A Little Help needed walking in hospital room?: A Little Help needed climbing 3-5 steps with a railing? : A Lot 6 Click Score: 11    End of Session Equipment Utilized During Treatment: Gait belt;Oxygen Activity Tolerance: Patient limited by pain Patient left: with call bell/phone within reach;in chair;with chair alarm set   PT Visit Diagnosis: Muscle weakness (generalized) (M62.81);Difficulty in walking, not elsewhere classified (R26.2);Pain Pain - Right/Left: (R and L) Pain - part of body: Ankle and joints of foot    Time: 1047-1107 PT Time Calculation (min) (ACUTE ONLY): 20 min   Charges:   PT Evaluation $PT Eval Moderate Complexity: 1 Mod     PT G Codes:          Weston Anna, MPT Pager: [phone]

## 2017-08-11 NOTE — Progress Notes (Signed)
Occupational Therapy Evaluation Patient Details Name: [name] MRN: [id number] DOB: Jun 20, 1941 Today's Date: 08/11/2017    History of Present Illness 77 yo female admitted with pulmonary edema, CHF exacerbation. Hx of COPD-O2 dep, HF, gout, chronic LE swelling, HTN, CKD, venous stasis.    Clinical Impression   Patient presents to OT with decreased ADL independence and safety due to the deficits listed below. She will benefit from skilled OT to maximize function and to facilitate a safe discharge.    Follow Up Recommendations  Supervision/Assistance - 24 hour;SNF    Equipment Recommendations  Other (comment)(tbd next venue of care)    Recommendations for Other Services       Precautions / Restrictions Precautions Precautions: Fall Precaution Comments: O2 dep Restrictions Weight Bearing Restrictions: No      Mobility Bed Mobility            General bed mobility comments: NT -- up in recliner  Transfers            General transfer comment: NT -- eval cut short due to visitors    Balance                                        ADL either performed or assessed with clinical judgement   ADL Overall ADL's : Needs assistance/impaired Eating/Feeding: Independent;Sitting Eating/Feeding Details (indicate cue type and reason): eating lunch during our session Grooming: Wash/dry hands;Wash/dry face;Brushing hair;Set up;Sitting   Upper Body Bathing: Minimal assistance;Sitting   Lower Body Bathing: Maximal assistance;Sit to/from stand;Sitting/lateral leans;Bed level   Upper Body Dressing : Minimal assistance;Sitting   Lower Body Dressing: Maximal assistance;Sitting/lateral leans;Sit to/from stand;Bed level     Toilet Transfer Details (indicate cue type and reason): has foley catheter           General ADL Comments: Patient received up in recliner with legs in dependent position and resting on elbows on tray table eating lunch. She  states her feet have been in so much pain. She also had discomfort on LUE where blood pressure cuff at "cut into her" in ED. She also stated her stomach hurts. Encouraged BLE elevation due to edema. Patient would benefit from energy conservation education and use of AE for LB self-care. Patient agreeable to OT treatment plan. Visitors arrived during OT evaluation and patient requested to visit with them. Encouraged patient to elevate BLEs while in recliner; she was agreeable.     Vision         Perception     Praxis      Pertinent Vitals/Pain Pain Assessment: 0-10 Pain Score: 8  Pain Location: bil feet  Pain Descriptors / Indicators: Tender;Sore;Grimacing Pain Intervention(s): Limited activity within patient's tolerance;Monitored during session     Hand Dominance Right   Extremity/Trunk Assessment Upper Extremity Assessment Upper Extremity Assessment: Generalized weakness   Lower Extremity Assessment Lower Extremity Assessment: Defer to PT evaluation   Cervical / Trunk Assessment Cervical / Trunk Assessment: Normal   Communication Communication Communication: No difficulties   Cognition Arousal/Alertness: Awake/alert Behavior During Therapy: WFL for tasks assessed/performed Overall Cognitive Status: Within Functional Limits for tasks assessed                                     General Comments  edema    Exercises  Shoulder Instructions      Home Living Family/patient expects to be discharged to:: Unsure Living Arrangements: Spouse/significant other;Children Available Help at Discharge: Family Type of Home: House Home Access: Stairs to enter [employer] of Steps: 1-back. 4 -front Entrance Stairs-Rails: None Home Layout: One level               Home Equipment: Cane - single point;Walker - 2 wheels;Bedside commode;Grab bars - tub/shower          Prior Functioning/Environment Level of Independence: Needs assistance  Gait  / Transfers Assistance Needed: cane for ambulation ADL's / Homemaking Assistance Needed: husband assisted with household tasks; also had difficulty with LB ADLs            OT Problem List: Decreased strength;Decreased activity tolerance;Impaired balance (sitting and/or standing);Decreased knowledge of use of DME or AE;Decreased knowledge of precautions;Cardiopulmonary status limiting activity;Obesity;Pain;Increased edema      OT Treatment/Interventions: Self-care/ADL training;Energy conservation;DME and/or AE instruction;Therapeutic activities;Patient/family education    OT Goals(Current goals can be found in the care plan section) Acute Rehab OT Goals Patient Stated Goal: less pain. home.  OT Goal Formulation: With patient Time For Goal Achievement: 08/25/17 Potential to Achieve Goals: Good ADL Goals Pt Will Perform Lower Body Bathing: with supervision;with adaptive equipment;sit to/from stand Pt Will Perform Lower Body Dressing: with supervision;with adaptive equipment;sit to/from stand Pt Will Transfer to Toilet: with supervision;bedside commode Pt Will Perform Toileting - Clothing Manipulation and hygiene: with supervision Additional ADL Goal #1: Patient will verbalize at least 3 energy conservation strategies to utilize during BADLs.  OT Frequency: Min 2X/week   Barriers to D/C:            Co-evaluation              AM-PAC PT "6 Clicks" Daily Activity     Outcome Measure Help from another person eating meals?: None Help from another person taking care of personal grooming?: A Little Help from another person toileting, which includes using toliet, bedpan, or urinal?: Total Help from another person bathing (including washing, rinsing, drying)?: A Lot Help from another person to put on and taking off regular upper body clothing?: A Little Help from another person to put on and taking off regular lower body clothing?: A Lot 6 Click Score: 15   End of Session Equipment  Utilized During Treatment: Oxygen  Activity Tolerance: Patient limited by fatigue Patient left: in chair;with call bell/phone within reach;with chair alarm set;with family/visitor present  OT Visit Diagnosis: Unsteadiness on feet (R26.81);Muscle weakness (generalized) (M62.81);Pain Pain - Right/Left: (bilateral feet) Pain - part of body: Ankle and joints of foot                Time: 1937-9024 OT Time Calculation (min): 14 min Charges:  OT General Charges $OT Visit: 1 Visit OT Evaluation $OT Eval Moderate Complexity: 1 Mod G-Codes:       Kyngston Pickelsimer A Larsen Zettel 08/18/2017, 1:47 PM

## 2017-08-11 NOTE — Progress Notes (Signed)
PROGRESS NOTE    [name]  PRF:[id number] DOB: March 14, 1941 DOA: 08/08/2017 PCP: [name], MD    Brief Narrative:  [name] is a 77 y.o. female past medical history of heart failure, renal insufficiency, COPD and gout presents the emergency room with chief complaint of abnormal labs.  Patient states that she has been having swelling her legs and fatigue for roughly 5 weeks.  Seen her primary care doctor.  Chest x-ray was clear.  No blood work at that time.  Discharge home.  Her Lasix was changed to torsemide.  Very little results.  Patient returned.  Felt no better.  Lab work checked.  Patient on her ride home was notified of low hemoglobin advised to come to the emergency room.  ED course: Repeat lab work confirmed low hemoglobin.  Imaging showed pulmonary edema.  Blood was ordered.  Hospitalist consulted for admission.      Assessment & Plan:   Principal Problem:   Pulmonary edema Active Problems:   Symptomatic anemia   Hypertensive emergency   Hyperlipidemia with target LDL less than 100   Morbid obesity (HCC)   HTN (hypertension)   GERD   CKD (chronic kidney disease), stage III (HCC)   Chronic venous stasis dermatitis of both lower extremities   Anemia, iron deficiency   Grade III diastolic dysfunction   Angiodysplasia of cecum   Angiodysplasia of duodenum   CHF exacerbation (HCC)   GI bleed  #1 pulmonary edema/acute on chronic diastolic CHF exacerbation Patient had presented with worsening shortness of breath on exertion noted to have lower extremity edema.  BNP on admission was elevated at 268.3.  Chest x-ray with bilateral pleural effusions.  Still volume overloaded on examination.  Cardiac enzymes negative x3.  2D echo with normal EF, no wall motion abnormalities, diastolic dysfunction.  Patient also noted to be hypertensive with systolic blood pressures in the 190s-200s on 08/10/2017.  Patient with some clinical improvement.  Urine output noted over  the past 24 hours has been 3800 cc.  Weight currently at 206 pounds from 209 pounds.  Blood pressure improved significantly now systolic blood pressures in the 100s.  Lasix was increased to 80 mg IV 3 times daily.  Will decrease BiDil to 1 tablet 3 times daily.  Decrease Lopressor to 25 mg twice daily.  Strict I's and O's.  Daily weights.  Repeat chest x-ray with no significant difference.  Follow.  2.  Symptomatic anemia/GI bleed/iron deficiency anemia Likely secondary to AVMs per gastroenterology.  Patient with no overt significant bleeding.  She was status post transfusion of 2 units packed red blood cells 08/09/2017, and 2 units of packed red blood cells on 08/10/2017.Marland Kitchen  Anemia panel consistent with iron deficiency anemia.  Hemoglobin currently at 11.3 from 7.9 from 8.4 from 6 from 5.6 on admission.  Transfusion threshold hemoglobin less than 8.  Patient has been assessed by GI.  Per GI patient with known AVMs in the upper and lower GI tract.  Patient with no overt melena or hematochezia.  Is felt patient symptomatic anemia secondary to chronic GI blood loss over time from AVMs given patient's iron deficiency.  It is noted that patient is very high risk for endoscopic intervention and as such GI recommending periodic transfusions and iron infusion as needed.  Per GI if patient develops significant refractory anemia despite these measures then high risk therapeutic endoscopy could be considered.  That is post IV Feraheme 08/10/2017.  Follow H&H.  Appreciate gastroenterology  input and recommendations.  3.  Hypertensive emergency Patient noted with systolic blood pressures in the 190s-200s on 08/09/2017 and 08/10/2017.  Lasix dose was adjusted to 80 mg IV every 8 hours.  Lopressor was adjusted to 50 mg twice daily.  Patient was started on BiDil 2 tablets 3 times a day and as needed hydralazine.  Blood pressure improved with latest blood pressure systolic of 213.  Will decrease BiDil to 1 tablet 3 times daily.   Decrease Lopressor to 25 mg twice daily.  We will hold his afternoon dose of Lasix 80 mg.  Follow. Continue and titrate as needed for better blood pressure control.  4.  Chronic kidney disease stage III Stable.  5.  Obesity  6.  COPD on home O2 Currently stable.  Continue Breo Ellipta.  Nebs as needed.  7.  Gout Continue allopurinol.  8.  History of ulcerative colitis Currently stable.  GI following.  Outpatient follow-up.   DVT prophylaxis: SCDs Code Status: DNR Family Communication: Updated patient.  No family at bedside. Disposition Plan: To be determined.   Consultants:   Neurology: Dr. Henrene Pastor 08/09/2017  Procedures:  2D echo 08/09/2017  Chest x-ray 08/09/2017, 08/08/2017  Transfused 2 units packed red blood cells 08/09/2017  Transfused 2 units packed red blood cells 08/10/2017  Antimicrobials:   None   Subjective: Patient states at times she feels improvement with her shortness of breath and at times she has some labored breathing.  Overall some improvement since admission however not at baseline.  Patient denies any chest pain.  Lower extremity edema improving.  Fatigue improving after transfusion.  Per nursing patient noted to have systolic blood pressures in the 100s at around 2 PM today.  Objective: Vitals:   08/11/17 0132 08/11/17 0404 08/11/17 0500 08/11/17 1103  BP: (!) 150/38 (!) 142/41 (!) 158/39 (!) 149/40  Pulse: 69 63 63 74  Resp: 20 20 20 20   Temp: 99.4 F (37.4 C) 98.4 F (36.9 C) 98.6 F (37 C) 98.5 F (36.9 C)  TempSrc: Oral Oral Oral Oral  SpO2: 94%  96% 97%  Weight:  93.5 kg (206 lb 2.1 oz)    Height:        Intake/Output Summary (Last 24 hours) at 08/11/2017 1136 Last data filed at 08/11/2017 0647 Gross per 24 hour  Intake 1926 ml  Output 3800 ml  Net -1874 ml   Filed Weights   08/10/17 1500 08/11/17 0404  Weight: 95.1 kg (209 lb 10.5 oz) 93.5 kg (206 lb 2.1 oz)    Examination:  General exam: Appears calm and comfortable    Respiratory system: Diffuse crackles.  No wheezing.  Fair air movement.  No rhonchi.  Decreased breath sounds in the bases.  Speaking in full sentences.  Respiratory effort normal. Cardiovascular system: Regular rate and rhythm no murmurs rubs or gallops.  No JVD.  Trace bilateral lower extremity edema.  Gastrointestinal system: Abdomen is soft, nondistended, nontender. No organomegaly or masses felt. Normal bowel sounds heard. Central nervous system: Alert and oriented. No focal neurological deficits. Extremities: Symmetric 5 x 5 power.  Lower extremities tender to palpation. Skin: Bilateral lower extremity venous stasis changes.  Psychiatry: Judgement and insight appear normal. Mood & affect appropriate.     Data Reviewed: I have personally reviewed following labs and imaging studies  CBC: Recent Labs  Lab 08/08/17 1622 08/08/17 2047 08/09/17 0306 08/09/17 1036 08/10/17 0518 08/11/17 0901  WBC 10.2 8.6 7.9  --  9.8 11.0*  NEUTROABS 7.2  --   --   --   --  8.5*  HGB 5.9 Repeated and verified X2.* 5.6* 6.0* 8.4* 7.9* 11.3*  11.1*  HCT 19.5 Repeated and verified X2.* 20.0* 20.5* 28.9* 26.6* 35.5*  35.3*  MCV 72.0* 76.3* 77.4*  --  76.2* 79.1  PLT 274.0 232 209  --  225 629   Basic Metabolic Panel: Recent Labs  Lab 08/08/17 1622 08/08/17 2047 08/09/17 0306 08/10/17 0518 08/11/17 0901  NA 139 137 138 138 136  K 3.9 3.5 3.4* 4.0 3.3*  CL 99 100* 101 101 92*  CO2 34* 32 29 31 32  GLUCOSE 114* 103* 100* 94 111*  BUN 16 17 16 17  24*  CREATININE 1.29* 1.31* 1.31* 1.43* 1.60*  CALCIUM 9.8 9.2 9.1 9.5 9.6  MG  --   --   --  2.2  --    GFR: Estimated Creatinine Clearance: 30.1 mL/min (A) (by C-G formula based on SCr of 1.6 mg/dL (H)). Liver Function Tests: Recent Labs  Lab 08/08/17 1622 08/08/17 2047  AST 12 17  ALT 7 11*  ALKPHOS 53 55  BILITOT 0.4 0.6  PROT 7.5 7.1  ALBUMIN 3.5 3.0*   No results for input(s): LIPASE, AMYLASE in the last 168 hours. No results  for input(s): AMMONIA in the last 168 hours. Coagulation Profile: Recent Labs  Lab 08/08/17 2051  INR 1.07   Cardiac Enzymes: Recent Labs  Lab 08/08/17 2051 08/09/17 0247 08/09/17 0855  TROPONINI <0.03 <0.03 <0.03   BNP (last 3 results) Recent Labs    08/08/17 1622  PROBNP 304.0*   HbA1C: No results for input(s): HGBA1C in the last 72 hours. CBG: No results for input(s): GLUCAP in the last 168 hours. Lipid Profile: No results for input(s): CHOL, HDL, LDLCALC, TRIG, CHOLHDL, LDLDIRECT in the last 72 hours. Thyroid Function Tests: Recent Labs    08/08/17 1622  TSH 3.22  T4TOTAL 10.2   Anemia Panel: Recent Labs    08/08/17 1622  FERRITIN 16.7  IRON 15*   Sepsis Labs: No results for input(s): PROCALCITON, LATICACIDVEN in the last 168 hours.  Recent Results (from the past 240 hour(s))  CULTURE, URINE COMPREHENSIVE     Status: Abnormal   Collection Time: 08/08/17  4:22 PM  Result Value Ref Range Status   MICRO NUMBER: [id number]  Final   SPECIMEN QUALITY: ADEQUATE  Final   Source URINE  Final   STATUS: FINAL  Final   ISOLATE 1: Lactobacillus species (A)  Final         Radiology Studies: Dg Chest Port 1 View  Result Date: 08/10/2017 CLINICAL DATA:  Shortness of Breath EXAM: PORTABLE CHEST 1 VIEW COMPARISON:  08/09/2017 FINDINGS: There is hyperinflation of the lungs compatible with COPD. Cardiomegaly with vascular congestion. Bibasilar atelectasis and small effusions noted. Findings similar to prior study. IMPRESSION: Continued cardiomegaly, vascular congestion with bibasilar atelectasis and effusions. No real change. Electronically Signed   By: Rolm Baptise M.D.   On: 08/10/2017 11:14        Scheduled Meds: . allopurinol  100 mg Oral Daily  . famotidine  20 mg Oral BID  . fluticasone furoate-vilanterol  1 puff Inhalation Daily  . furosemide  80 mg Intravenous Q8H  . gabapentin  200 mg Oral TID  . isosorbide-hydrALAZINE  2 tablet Oral TID  .  metolazone  5 mg Oral Daily  . metoprolol tartrate  50 mg Oral BID  . potassium chloride  40 mEq Oral Once  . sodium chloride flush  3 mL Intravenous Q12H  .  cyanocobalamin  2,000 mcg Oral Daily   Continuous Infusions:    LOS: 3 days    Time spent: 40 minutes    Irine Seal, MD [facility] Pager [phone]  If 7PM-7AM, please contact night-coverage [email or web address] Password [facility] 08/11/2017, 11:36 AM

## 2017-08-11 NOTE — Progress Notes (Signed)
Blood is done.  Blount MD on call and notified as per order for diuresis and bp orders.  BP is 158/39.    She orders to continue lasix as ordered with is 49ms IV at 0600

## 2017-08-12 DIAGNOSIS — N179 Acute kidney failure, unspecified: Secondary | ICD-10-CM | POA: Diagnosis not present

## 2017-08-12 DIAGNOSIS — N189 Chronic kidney disease, unspecified: Secondary | ICD-10-CM

## 2017-08-12 LAB — CBC WITH DIFFERENTIAL/PLATELET
Basophils Absolute: 0 10*3/uL (ref 0.0–0.1)
Basophils Relative: 0 %
EOS PCT: 3 %
Eosinophils Absolute: 0.4 10*3/uL (ref 0.0–0.7)
HEMATOCRIT: 33.9 % — AB (ref 36.0–46.0)
HEMOGLOBIN: 10.5 g/dL — AB (ref 12.0–15.0)
LYMPHS PCT: 13 %
Lymphs Abs: 1.7 10*3/uL (ref 0.7–4.0)
MCH: 24.8 pg — ABNORMAL LOW (ref 26.0–34.0)
MCHC: 31 g/dL (ref 30.0–36.0)
MCV: 80 fL (ref 78.0–100.0)
MONOS PCT: 14 %
Monocytes Absolute: 1.8 10*3/uL — ABNORMAL HIGH (ref 0.1–1.0)
NEUTROS ABS: 8.9 10*3/uL — AB (ref 1.7–7.7)
NEUTROS PCT: 70 %
Platelets: 218 10*3/uL (ref 150–400)
RBC: 4.24 MIL/uL (ref 3.87–5.11)
RDW: 21.8 % — ABNORMAL HIGH (ref 11.5–15.5)
WBC: 12.8 10*3/uL — ABNORMAL HIGH (ref 4.0–10.5)

## 2017-08-12 LAB — BASIC METABOLIC PANEL
Anion gap: 10 (ref 5–15)
BUN: 36 mg/dL — AB (ref 6–20)
CHLORIDE: 94 mmol/L — AB (ref 101–111)
CO2: 30 mmol/L (ref 22–32)
Calcium: 9.4 mg/dL (ref 8.9–10.3)
Creatinine, Ser: 2.36 mg/dL — ABNORMAL HIGH (ref 0.44–1.00)
GFR calc Af Amer: 22 mL/min — ABNORMAL LOW (ref >60)
GFR calc non Af Amer: 19 mL/min — ABNORMAL LOW (ref >60)
GLUCOSE: 99 mg/dL (ref 65–99)
POTASSIUM: 3.7 mmol/L (ref 3.5–5.1)
SODIUM: 134 mmol/L — AB (ref 135–145)

## 2017-08-12 LAB — TYPE AND SCREEN
ABO/RH(D): O POS
Antibody Screen: POSITIVE
DONOR AG TYPE: NEGATIVE
DONOR AG TYPE: NEGATIVE
DONOR AG TYPE: NEGATIVE
Donor AG Type: NEGATIVE
PT AG Type: NEGATIVE
UNIT DIVISION: 0
UNIT DIVISION: 0
UNIT DIVISION: 0
Unit division: 0

## 2017-08-12 LAB — BPAM RBC
BLOOD PRODUCT EXPIRATION DATE: 201902092359
BLOOD PRODUCT EXPIRATION DATE: 201902112359
BLOOD PRODUCT EXPIRATION DATE: 201902112359
Blood Product Expiration Date: 201902132359
ISSUE DATE / TIME: 201901110447
ISSUE DATE / TIME: 201901130105
ISSUE DATE / TIME: 201901110111
ISSUE DATE / TIME: 201901122158
UNIT TYPE AND RH: 5100
Unit Type and Rh: 5100
Unit Type and Rh: 5100
Unit Type and Rh: 5100

## 2017-08-12 LAB — MAGNESIUM: MAGNESIUM: 2.1 mg/dL (ref 1.7–2.4)

## 2017-08-12 MED ORDER — FAMOTIDINE 20 MG PO TABS
20.0000 mg | ORAL_TABLET | Freq: Every day | ORAL | Status: DC
  Administered 2017-08-12 – 2017-08-15 (×4): 20 mg via ORAL
  Filled 2017-08-12 (×4): qty 1

## 2017-08-12 MED ORDER — POLYETHYLENE GLYCOL 3350 17 G PO PACK
17.0000 g | PACK | Freq: Every day | ORAL | Status: DC
  Administered 2017-08-12 – 2017-08-15 (×4): 17 g via ORAL
  Filled 2017-08-12 (×4): qty 1

## 2017-08-12 NOTE — Care Management Important Message (Signed)
Important Message  Patient Details  Name: [name] MRN: [id number] Date of Birth: 1941/06/04   Medicare Important Message Given:  Yes    Kerin Salen 08/12/2017, 12:07 [place] Message  Patient Details  Name: [name] MRN: [id number] Date of Birth: 06/15/1941   Medicare Important Message Given:  Yes    Kerin Salen 08/12/2017, 12:07 PM

## 2017-08-12 NOTE — Progress Notes (Addendum)
PROGRESS NOTE    [name]  FYB:[id number] DOB: 1940-08-14 DOA: 08/08/2017 PCP: [name], MD    Brief Narrative:  [name] is a 77 y.o. female past medical history of heart failure, renal insufficiency, COPD and gout presents the emergency room with chief complaint of abnormal labs.  Patient states that she has been having swelling her legs and fatigue for roughly 5 weeks.  Seen her primary care doctor.  Chest x-ray was clear.  No blood work at that time.  Discharge home.  Her Lasix was changed to torsemide.  Very little results.  Patient returned.  Felt no better.  Lab work checked.  Patient on her ride home was notified of low hemoglobin advised to come to the emergency room.  ED course: Repeat lab work confirmed low hemoglobin.  Imaging showed pulmonary edema.  Blood was ordered.  Hospitalist consulted for admission.      Assessment & Plan:   Principal Problem:   Pulmonary edema Active Problems:   Symptomatic anemia   Hypertensive emergency   Hyperlipidemia with target LDL less than 100   Morbid obesity (HCC)   HTN (hypertension)   GERD   CKD (chronic kidney disease), stage III (HCC)   Chronic venous stasis dermatitis of both lower extremities   Anemia, iron deficiency   Grade III diastolic dysfunction   Angiodysplasia of cecum   Angiodysplasia of duodenum   CHF exacerbation (HCC)   GI bleed   Acute kidney injury superimposed on CKD ([place]) Stage III  #1 pulmonary edema/acute on chronic diastolic CHF exacerbation Patient had presented with worsening shortness of breath on exertion noted to have lower extremity edema.  BNP on admission was elevated at 268.3.  Chest x-ray with bilateral pleural effusions.  Cardiac enzymes negative x3.  2D echo with normal EF, no wall motion abnormalities, diastolic dysfunction.  Patient also noted to be hypertensive with systolic blood pressures in the 190s-200s on 08/10/2017.  Patient with some clinical improvement.  Urine  output noted over the past 24 hours has been 700 cc.  Weight currently at 202 pounds from 206 pounds from 209 pounds.  Blood pressure improved significantly now systolic blood pressures in the 130s.  Patient with a bump in the creatinine.  Discontinue IV Lasix and hold Lasix for today.  Discontinue Zaroxolyn.  Renal function improves tomorrow may consider resuming home dose oral Lasix.  BiDil has been decreased to 1 tablet 3 times daily.  Decreased Lopressor to 25 mg twice daily.  Strict I's and O's.  Daily weights.  Follow.  2.  Symptomatic anemia/GI bleed/iron deficiency anemia Likely secondary to AVMs per gastroenterology.  Patient with no overt significant bleeding.  She was status post transfusion of 2 units packed red blood cells 08/09/2017, and 2 units of packed red blood cells on 08/10/2017.Marland Kitchen  Anemia panel consistent with iron deficiency anemia.  Hemoglobin currently at 10.5 from 11.3 from 7.9 from 8.4 from 6 from 5.6 on admission.  Transfusion threshold hemoglobin less than 8.  Patient has been assessed by GI.  Per GI patient with known AVMs in the upper and lower GI tract.  Patient with no overt melena or hematochezia.  Is felt patient symptomatic anemia secondary to chronic GI blood loss over time from AVMs given patient's iron deficiency.  It is noted that patient is very high risk for endoscopic intervention and as such GI recommending periodic transfusions and iron infusion as needed.  Per GI if patient develops significant refractory anemia  despite these measures then high risk therapeutic endoscopy could be considered. Status post IV Feraheme 08/10/2017.  Follow H&H.  Appreciate gastroenterology input and recommendations.  3.  Hypertensive emergency Patient noted with systolic blood pressures in the 190s-200s on 08/09/2017 and 08/10/2017.  Lasix dose was adjusted to 80 mg IV every 8 hours.  Lopressor was adjusted to 50 mg twice daily.  Patient was started on BiDil 2 tablets 3 times a day and as  needed hydralazine.  Blood pressure improved with latest blood pressure systolic of 209.  Decreased BiDil to 1 tablet 3 times daily.  Decreased Lopressor to 25 mg twice daily.  Discontinue IV Lasix.   4.  Acute on chronic kidney disease stage III Renal function with a creatinine now at 2.36 from 1.60.  Likely secondary to aggressive diuresis.  Discontinue IV Lasix and hold Lasix for today.  Discontinue Zaroxolyn.  If renal function improves could likely start home regimen of oral Lasix tomorrow.  Follow.    5.  Obesity  6.  COPD on home O2 Currently stable.  Continue Breo Ellipta.  Nebs as needed.  7.  Gout Continue allopurinol.  8.  History of ulcerative colitis Currently stable.  GI following.  Outpatient follow-up.   DVT prophylaxis: SCDs Code Status: DNR Family Communication: Updated husband.   Disposition Plan: Likely SNF   Consultants:   Neurology: Dr. Henrene Pastor 08/09/2017  Procedures:  2D echo 08/09/2017  Chest x-ray 08/09/2017, 08/08/2017  Transfused 2 units packed red blood cells 08/09/2017  Transfused 2 units packed red blood cells 08/10/2017  Antimicrobials:   None   Subjective: Patient sleeping deeply.    Objective: Vitals:   08/11/17 2351 08/12/17 0608 08/12/17 1009 08/12/17 1300  BP: (!) 118/48 (!) 110/52  (!) 135/41  Pulse:  78  75  Resp:  18  20  Temp:  99.8 F (37.7 C)  99.3 F (37.4 C)  TempSrc:  Oral  Oral  SpO2:  95% 95% 94%  Weight:  92 kg (202 lb 13.2 oz)    Height:        Intake/Output Summary (Last 24 hours) at 08/12/2017 1412 Last data filed at 08/12/2017 1301 Gross per 24 hour  Intake -  Output 1450 ml  Net -1450 ml   Filed Weights   08/10/17 1500 08/11/17 0404 08/12/17 0608  Weight: 95.1 kg (209 lb 10.5 oz) 93.5 kg (206 lb 2.1 oz) 92 kg (202 lb 13.2 oz)    Examination:  General exam: Sleeping deeply. Respiratory system: Clear to auscultation bilaterally anterior lung fields.  No rhonchi noted.  Respiratory effort  normal. Cardiovascular system: Regular rate and rhythm no murmurs rubs or gallops.  No JVD.  No bilateral lower extremity edema.  Gastrointestinal system: Abdomen is soft, nondistended, nontender. No organomegaly or masses felt. Normal bowel sounds heard. Central nervous system: Alert and oriented. No focal neurological deficits. Extremities: Symmetric 5 x 5 power.  Lower extremities tender to palpation. Skin: Bilateral lower extremity venous stasis changes.  Psychiatry: Judgement and insight appear normal. Mood & affect appropriate.     Data Reviewed: I have personally reviewed following labs and imaging studies  CBC: Recent Labs  Lab 08/08/17 1622 08/08/17 2047 08/09/17 0306 08/09/17 1036 08/10/17 0518 08/11/17 0901 08/12/17 0506  WBC 10.2 8.6 7.9  --  9.8 11.0* 12.8*  NEUTROABS 7.2  --   --   --   --  8.5* 8.9*  HGB 5.9 Repeated and verified X2.* 5.6* 6.0* 8.4* 7.9* 11.3*  11.1* 10.5*  HCT 19.5 Repeated and verified X2.* 20.0* 20.5* 28.9* 26.6* 35.5*  35.3* 33.9*  MCV 72.0* 76.3* 77.4*  --  76.2* 79.1 80.0  PLT 274.0 232 209  --  225 214 741   Basic Metabolic Panel: Recent Labs  Lab 08/08/17 2047 08/09/17 0306 08/10/17 0518 08/11/17 0901 08/12/17 0506  NA 137 138 138 136 134*  K 3.5 3.4* 4.0 3.3* 3.7  CL 100* 101 101 92* 94*  CO2 32 29 31 32 30  GLUCOSE 103* 100* 94 111* 99  BUN 17 16 17  24* 36*  CREATININE 1.31* 1.31* 1.43* 1.60* 2.36*  CALCIUM 9.2 9.1 9.5 9.6 9.4  MG  --   --  2.2 2.1 2.1   GFR: Estimated Creatinine Clearance: 20.2 mL/min (A) (by C-G formula based on SCr of 2.36 mg/dL (H)). Liver Function Tests: Recent Labs  Lab 08/08/17 1622 08/08/17 2047  AST 12 17  ALT 7 11*  ALKPHOS 53 55  BILITOT 0.4 0.6  PROT 7.5 7.1  ALBUMIN 3.5 3.0*   No results for input(s): LIPASE, AMYLASE in the last 168 hours. No results for input(s): AMMONIA in the last 168 hours. Coagulation Profile: Recent Labs  Lab 08/08/17 2051  INR 1.07   Cardiac  Enzymes: Recent Labs  Lab 08/08/17 2051 08/09/17 0247 08/09/17 0855  TROPONINI <0.03 <0.03 <0.03   BNP (last 3 results) Recent Labs    08/08/17 1622  PROBNP 304.0*   HbA1C: No results for input(s): HGBA1C in the last 72 hours. CBG: No results for input(s): GLUCAP in the last 168 hours. Lipid Profile: No results for input(s): CHOL, HDL, LDLCALC, TRIG, CHOLHDL, LDLDIRECT in the last 72 hours. Thyroid Function Tests: No results for input(s): TSH, T4TOTAL, FREET4, T3FREE, THYROIDAB in the last 72 hours. Anemia Panel: No results for input(s): VITAMINB12, FOLATE, FERRITIN, TIBC, IRON, RETICCTPCT in the last 72 hours. Sepsis Labs: No results for input(s): PROCALCITON, LATICACIDVEN in the last 168 hours.  Recent Results (from the past 240 hour(s))  CULTURE, URINE COMPREHENSIVE     Status: Abnormal   Collection Time: 08/08/17  4:22 PM  Result Value Ref Range Status   MICRO NUMBER: [id number]  Final   SPECIMEN QUALITY: ADEQUATE  Final   Source URINE  Final   STATUS: FINAL  Final   ISOLATE 1: Lactobacillus species (A)  Final         Radiology Studies: No results found.      Scheduled Meds: . allopurinol  100 mg Oral Daily  . famotidine  20 mg Oral Daily  . fluticasone furoate-vilanterol  1 puff Inhalation Daily  . isosorbide-hydrALAZINE  1 tablet Oral TID  . metolazone  5 mg Oral Daily  . metoprolol tartrate  25 mg Oral BID  . sodium chloride flush  3 mL Intravenous Q12H  . cyanocobalamin  2,000 mcg Oral Daily   Continuous Infusions:    LOS: 4 days    Time spent: 40 minutes    Irine Seal, MD [facility] Pager [phone]  If 7PM-7AM, please contact night-coverage [email or web address] Password [email or web address] 08/12/2017, 2:12 PM

## 2017-08-13 LAB — CBC WITH DIFFERENTIAL/PLATELET
BASOS ABS: 0 10*3/uL (ref 0.0–0.1)
Basophils Relative: 0 %
Eosinophils Absolute: 0.4 10*3/uL (ref 0.0–0.7)
Eosinophils Relative: 3 %
HCT: 32.6 % — ABNORMAL LOW (ref 36.0–46.0)
Hemoglobin: 10 g/dL — ABNORMAL LOW (ref 12.0–15.0)
LYMPHS ABS: 1.8 10*3/uL (ref 0.7–4.0)
Lymphocytes Relative: 14 %
MCH: 24.5 pg — ABNORMAL LOW (ref 26.0–34.0)
MCHC: 30.7 g/dL (ref 30.0–36.0)
MCV: 79.9 fL (ref 78.0–100.0)
MONO ABS: 2 10*3/uL — AB (ref 0.1–1.0)
Monocytes Relative: 16 %
Neutro Abs: 8.5 10*3/uL — ABNORMAL HIGH (ref 1.7–7.7)
Neutrophils Relative %: 67 %
PLATELETS: 206 10*3/uL (ref 150–400)
RBC: 4.08 MIL/uL (ref 3.87–5.11)
RDW: 22.3 % — AB (ref 11.5–15.5)
WBC: 12.7 10*3/uL — AB (ref 4.0–10.5)

## 2017-08-13 LAB — BASIC METABOLIC PANEL
ANION GAP: 10 (ref 5–15)
BUN: 52 mg/dL — ABNORMAL HIGH (ref 6–20)
CALCIUM: 9.1 mg/dL (ref 8.9–10.3)
CO2: 32 mmol/L (ref 22–32)
CREATININE: 2.32 mg/dL — AB (ref 0.44–1.00)
Chloride: 90 mmol/L — ABNORMAL LOW (ref 101–111)
GFR calc non Af Amer: 19 mL/min — ABNORMAL LOW (ref >60)
GFR, EST AFRICAN AMERICAN: 22 mL/min — AB (ref >60)
GLUCOSE: 108 mg/dL — AB (ref 65–99)
Potassium: 4.1 mmol/L (ref 3.5–5.1)
Sodium: 132 mmol/L — ABNORMAL LOW (ref 135–145)

## 2017-08-13 LAB — URIC ACID: Uric Acid, Serum: 11.6 mg/dL — ABNORMAL HIGH (ref 2.3–6.6)

## 2017-08-13 MED ORDER — PREDNISONE 20 MG PO TABS
40.0000 mg | ORAL_TABLET | Freq: Every day | ORAL | Status: DC
  Administered 2017-08-14 – 2017-08-15 (×2): 40 mg via ORAL
  Filled 2017-08-13 (×2): qty 2

## 2017-08-13 MED ORDER — SODIUM CHLORIDE 0.9 % IV SOLN
INTRAVENOUS | Status: DC
  Administered 2017-08-13: 13:00:00 via INTRAVENOUS

## 2017-08-13 MED ORDER — GABAPENTIN 100 MG PO CAPS
100.0000 mg | ORAL_CAPSULE | Freq: Three times a day (TID) | ORAL | Status: AC
  Administered 2017-08-13 – 2017-08-14 (×6): 100 mg via ORAL
  Filled 2017-08-13 (×6): qty 1

## 2017-08-13 MED ORDER — METHYLPREDNISOLONE SODIUM SUCC 125 MG IJ SOLR
60.0000 mg | Freq: Once | INTRAMUSCULAR | Status: AC
  Administered 2017-08-13: 60 mg via INTRAVENOUS
  Filled 2017-08-13: qty 2

## 2017-08-13 NOTE — Progress Notes (Signed)
Occupational Therapy Treatment Patient Details Name: [name] MRN: [id number] DOB: 11/11/1940 Today's Date: 08/13/2017    History of present illness 77 yo female admitted with pulmonary edema, CHF exacerbation. Hx of COPD-O2 dep, HF, gout, chronic LE swelling, HTN, CKD, venous stasis.    OT comments  Pt very limited by bil foot pain, R>L.  Crying with SPT back to bed, going to less painful side  Follow Up Recommendations  SNF(pt currently refusing)    Equipment Recommendations  (has 3:1)    Recommendations for Other Services      Precautions / Restrictions Precautions Precautions: Fall Precaution Comments: O2 dep Restrictions Weight Bearing Restrictions: No       Mobility Bed Mobility Overal bed mobility: Needs Assistance Bed Mobility: Supine to Sit     Supine to sit: Min guard;HOB elevated Sit to supine: Min assist;Mod assist   General bed mobility comments: assist for bil LEs  Transfers Overall transfer level: Needs assistance Equipment used: Rolling walker (2 wheeled) Transfers: Sit to/from [facility] Sit to Stand: Mod assist;+2 safety/equipment(from recliner) Stand pivot transfers: Min assist       General transfer comment: pt crying with pain    Balance             Standing balance-Leahy Scale: Poor                             ADL either performed or assessed with clinical judgement   ADL                           Toilet Transfer: Moderate assistance;+2 for safety/equipment;Stand-pivot;RW(chair to bed)             General ADL Comments: repositioned chair to perform SPT back to bed to L, less painful side. Pt crying with pain.  Mod +2 sit to stand and min A for SPT.  extra time.  Pt's UEs are weak:  unable to scoot over.  Husband present and reports arms get shaky as day goes on     Vision       Perception     Praxis      Cognition Arousal/Alertness: Awake/alert Behavior During  Therapy: WFL for tasks assessed/performed Overall Cognitive Status: Within Functional Limits for tasks assessed                                 General Comments: pt demonstartes low motivation and does not put forth full effort.  Immediately pt stated "I can't walk"        Exercises     Shoulder Instructions       General Comments      Pertinent Vitals/ Pain       Pain Assessment: Faces Pain Score: 10-Worst pain ever Faces Pain Scale: Hurts even more Pain Location: both feet, but R worst Pain Descriptors / Indicators: Tender;Sore;Grimacing Pain Intervention(s): Limited activity within patient's tolerance;Monitored during session;Premedicated before session;Repositioned;Ice applied  Home Living                                          Prior Functioning/Environment              Frequency  Progress Toward Goals  OT Goals(current goals can now be found in the care plan section)  Progress towards OT goals: Not progressing toward goals - comment(limited by pain)     Plan      Co-evaluation                 AM-PAC PT "6 Clicks" Daily Activity     Outcome Measure   Help from another person eating meals?: None Help from another person taking care of personal grooming?: A Little Help from another person toileting, which includes using toliet, bedpan, or urinal?: A Lot Help from another person bathing (including washing, rinsing, drying)?: A Lot Help from another person to put on and taking off regular upper body clothing?: A Little Help from another person to put on and taking off regular lower body clothing?: A Lot 6 Click Score: 16    End of Session Equipment Utilized During Treatment: Oxygen  OT Visit Diagnosis: Pain Pain - Right/Left: (bil) Pain - part of body: Ankle and joints of foot   Activity Tolerance Patient limited by pain   Patient Left in bed;with call bell/phone within reach;with bed alarm  set;with family/visitor present   Nurse Communication          Time: 4949-4473 OT Time Calculation (min): 22 min  Charges: OT General Charges $OT Visit: 1 Visit OT Treatments $Therapeutic Activity: 8-22 mins  [name], OTR/L [phone] 08/13/2017   [name] 08/13/2017, 2:54 PM

## 2017-08-13 NOTE — Progress Notes (Addendum)
PROGRESS NOTE    [name]  [id number]:[id number] DOB: 1941/03/01 DOA: 08/08/2017 PCP: [name], MD    Brief Narrative:  [name] is a 77 y.o. female past medical history of heart failure, renal insufficiency, COPD and gout presents the emergency room with chief complaint of abnormal labs.  Patient states that she has been having swelling her legs and fatigue for roughly 5 weeks.  Seen her primary care doctor.  Chest x-ray was clear.  No blood work at that time.  Discharge home.  Her Lasix was changed to torsemide.  Very little results.  Patient returned.  Felt no better.  Lab work checked.  Patient on her ride home was notified of low hemoglobin advised to come to the emergency room.  ED course: Repeat lab work confirmed low hemoglobin.  Imaging showed pulmonary edema.  Blood was ordered.  Hospitalist consulted for admission.      Assessment & Plan:   Principal Problem:   Pulmonary edema Active Problems:   Symptomatic anemia   Hypertensive emergency   Hyperlipidemia with target LDL less than 100   Morbid obesity (HCC)   HTN (hypertension)   GERD   CKD (chronic kidney disease), stage III (HCC)   Chronic venous stasis dermatitis of both lower extremities   Anemia, iron deficiency   Grade III diastolic dysfunction   Angiodysplasia of cecum   Angiodysplasia of duodenum   CHF exacerbation (HCC)   GI bleed   Acute kidney injury superimposed on CKD ([place]) Stage III  #1 pulmonary edema/acute on chronic diastolic CHF exacerbation Patient had presented with worsening shortness of breath on exertion noted to have lower extremity edema.  BNP on admission was elevated at 268.3.  Chest x-ray with bilateral pleural effusions.  Cardiac enzymes negative x3.  2D echo with normal EF, no wall motion abnormalities, diastolic dysfunction.  Patient also noted to be hypertensive with systolic blood pressures in the 190s-200s on 08/10/2017.  Patient with some clinical improvement.  Urine  output noted over the past 24 hours has been 2.450L.  Weight currently at 201 pounds from 202 pounds from 206 pounds from 209 pounds.  Blood pressure improved significantly now systolic blood pressures in the 130s.  Patient with a bump in the creatinine.  Discontinued IV Lasix and Zaroxolyn.  If Renal function improves tomorrow may consider resuming home dose oral Lasix.  BiDil has been decreased to 1 tablet 3 times daily.  Decreased Lopressor to 25 mg twice daily.  Strict I's and O's.  Daily weights.  Follow.  2.  Symptomatic anemia/GI bleed/iron deficiency anemia Likely secondary to AVMs per gastroenterology.  Patient with no overt significant bleeding.  She was status post transfusion of 2 units packed red blood cells 08/09/2017, and 2 units of packed red blood cells on 08/10/2017.Marland Kitchen  Anemia panel consistent with iron deficiency anemia.  Hemoglobin currently at 10.5 from 11.3 from 7.9 from 8.4 from 6 from 5.6 on admission.  Transfusion threshold hemoglobin less than 8.  Patient has been assessed by GI.  Per GI patient with known AVMs in the upper and lower GI tract.  Patient with no overt melena or hematochezia.  Is felt patient symptomatic anemia secondary to chronic GI blood loss over time from AVMs given patient's iron deficiency.  It is noted that patient is very high risk for endoscopic intervention and as such GI recommending periodic transfusions and iron infusion as needed.  Per GI if patient develops significant refractory anemia despite these measures  then high risk therapeutic endoscopy could be considered. Status post IV Feraheme 08/10/2017.  Follow H&H.  Appreciate gastroenterology input and recommendations.  3.  Hypertensive emergency Patient noted with systolic blood pressures in the 190s-200s on 08/09/2017 and 08/10/2017.  Lasix dose was adjusted to 80 mg IV every 8 hours.  Lopressor was adjusted to 50 mg twice daily.  Patient was started on BiDil 2 tablets 3 times a day and as needed hydralazine.   Blood pressure improved with latest blood pressure systolic of 161W.  Decreased BiDil to 1 tablet 3 times daily.  Decreased Lopressor to 25 mg twice daily.  Discontinued IV Lasix.   4.  Acute on chronic kidney disease stage III Renal function with a creatinine now at 2.32 from 1.60.  Likely secondary to aggressive diuresis.  Discontinued IV Lasix.  Discontinued Zaroxolyn.  Place on gentle hydration normal saline 50 cc an hour for the next 24 hours and follow renal function.   5.  Obesity  6.  COPD on home O2 Currently stable.  Continue Breo Ellipta.  Nebs as needed.  7.  Gout Continue allopurinol.  See #9.  8.  History of ulcerative colitis Currently stable.  GI following.  Outpatient follow-up.  9.  Bilateral lower extremity pain/??  Gout flare Patient complaining of bilateral foot pain.  Questionable etiology.  Concern as to whether a gout flare.  Check a uric acid level.  We will give Solu-Medrol 60 mg IV x1, and then prednisone 40 mg daily times 3 days.  Also start Neurontin 100 mg p.o. 3 times daily.  Follow.   DVT prophylaxis: SCDs Code Status: DNR Family Communication: Updated patient.  No family at bedside.  Disposition Plan: Likely SNF   Consultants:   Neurology: Dr. Henrene Pastor 08/09/2017  Procedures:  2D echo 08/09/2017  Chest x-ray 08/09/2017, 08/08/2017  Transfused 2 units packed red blood cells 08/09/2017  Transfused 2 units packed red blood cells 08/10/2017  Antimicrobials:   None   Subjective: Patient sitting up in chair.  Patient denies any chest pain.  Patient states shortness of breath has improved.  Patient is complaining of bilateral foot pain now right greater than left.    Objective: Vitals:   08/12/17 1300 08/12/17 2142 08/13/17 0619 08/13/17 0840  BP: (!) 135/41 (!) 141/40 (!) 136/34   Pulse: 75 80 66   Resp: 20 20 18    Temp: 99.3 F (37.4 C) 99.1 F (37.3 C) 99 F (37.2 C)   TempSrc: Oral Oral Oral   SpO2: 94% 97% 96% 96%  Weight:   91.2 kg  (201 lb 1.6 oz)   Height:        Intake/Output Summary (Last 24 hours) at 08/13/2017 1151 Last data filed at 08/13/2017 9604 Gross per 24 hour  Intake -  Output 2450 ml  Net -2450 ml   Filed Weights   08/11/17 0404 08/12/17 0608 08/13/17 0619  Weight: 93.5 kg (206 lb 2.1 oz) 92 kg (202 lb 13.2 oz) 91.2 kg (201 lb 1.6 oz)    Examination:  General exam: NAD Respiratory system: Some scattered coarse/fine crackles in the bases.  No rhonchi.  No wheezing. Respiratory effort normal. Cardiovascular system: Regular rate and rhythm no murmurs rubs or gallops.  No JVD.  No bilateral lower extremity edema.  Gastrointestinal system: Abdomen is nondistended, soft, nontender, positive bowel sounds, no hepatosplenomegaly.  Central nervous system: Alert and oriented. No focal neurological deficits. Extremities: Symmetric 5 x 5 power.  Lower extremities tender to palpation.  Skin: Bilateral lower extremity venous stasis changes.  Psychiatry: Judgement and insight appear normal. Mood & affect appropriate.     Data Reviewed: I have personally reviewed following labs and imaging studies  CBC: Recent Labs  Lab 08/08/17 1622  08/09/17 0306 08/09/17 1036 08/10/17 0518 08/11/17 0901 08/12/17 0506 08/13/17 0601  WBC 10.2   < > 7.9  --  9.8 11.0* 12.8* 12.7*  NEUTROABS 7.2  --   --   --   --  8.5* 8.9* 8.5*  HGB 5.9 Repeated and verified X2.*   < > 6.0* 8.4* 7.9* 11.3*  11.1* 10.5* 10.0*  HCT 19.5 Repeated and verified X2.*   < > 20.5* 28.9* 26.6* 35.5*  35.3* 33.9* 32.6*  MCV 72.0*   < > 77.4*  --  76.2* 79.1 80.0 79.9  PLT 274.0   < > 209  --  225 214 218 206   < > = values in this interval not displayed.   Basic Metabolic Panel: Recent Labs  Lab 08/09/17 0306 08/10/17 0518 08/11/17 0901 08/12/17 0506 08/13/17 0601  NA 138 138 136 134* 132*  K 3.4* 4.0 3.3* 3.7 4.1  CL 101 101 92* 94* 90*  CO2 29 31 32 30 32  GLUCOSE 100* 94 111* 99 108*  BUN 16 17 24* 36* 52*  CREATININE 1.31*  1.43* 1.60* 2.36* 2.32*  CALCIUM 9.1 9.5 9.6 9.4 9.1  MG  --  2.2 2.1 2.1  --    GFR: Estimated Creatinine Clearance: 20.5 mL/min (A) (by C-G formula based on SCr of 2.32 mg/dL (H)). Liver Function Tests: Recent Labs  Lab 08/08/17 1622 08/08/17 2047  AST 12 17  ALT 7 11*  ALKPHOS 53 55  BILITOT 0.4 0.6  PROT 7.5 7.1  ALBUMIN 3.5 3.0*   No results for input(s): LIPASE, AMYLASE in the last 168 hours. No results for input(s): AMMONIA in the last 168 hours. Coagulation Profile: Recent Labs  Lab 08/08/17 2051  INR 1.07   Cardiac Enzymes: Recent Labs  Lab 08/08/17 2051 08/09/17 0247 08/09/17 0855  TROPONINI <0.03 <0.03 <0.03   BNP (last 3 results) Recent Labs    08/08/17 1622  PROBNP 304.0*   HbA1C: No results for input(s): HGBA1C in the last 72 hours. CBG: No results for input(s): GLUCAP in the last 168 hours. Lipid Profile: No results for input(s): CHOL, HDL, LDLCALC, TRIG, CHOLHDL, LDLDIRECT in the last 72 hours. Thyroid Function Tests: No results for input(s): TSH, T4TOTAL, FREET4, T3FREE, THYROIDAB in the last 72 hours. Anemia Panel: No results for input(s): VITAMINB12, FOLATE, FERRITIN, TIBC, IRON, RETICCTPCT in the last 72 hours. Sepsis Labs: No results for input(s): PROCALCITON, LATICACIDVEN in the last 168 hours.  Recent Results (from the past 240 hour(s))  CULTURE, URINE COMPREHENSIVE     Status: Abnormal   Collection Time: 08/08/17  4:22 PM  Result Value Ref Range Status   MICRO NUMBER: [id number]  Final   SPECIMEN QUALITY: ADEQUATE  Final   Source URINE  Final   STATUS: FINAL  Final   ISOLATE 1: Lactobacillus species (A)  Final         Radiology Studies: No results found.      Scheduled Meds: . allopurinol  100 mg Oral Daily  . famotidine  20 mg Oral Daily  . fluticasone furoate-vilanterol  1 puff Inhalation Daily  . isosorbide-hydrALAZINE  1 tablet Oral TID  . metoprolol tartrate  25 mg Oral BID  . polyethylene glycol  17 g Oral  Daily  . sodium chloride flush  3 mL Intravenous Q12H  . cyanocobalamin  2,000 mcg Oral Daily   Continuous Infusions:    LOS: 5 days    Time spent: 40 minutes    Irine Seal, MD [facility] Pager [phone]  If 7PM-7AM, please contact night-coverage [email or web address] Password [facility] 08/13/2017, 11:51 AM

## 2017-08-13 NOTE — Progress Notes (Signed)
Assumed care of patient at this time. Patient is stable with no complaints at this time. Agree with previously documented assessment. Will continue to monitor patient.   

## 2017-08-13 NOTE — Care Management Note (Signed)
Case Management Note  Patient Details  Name: SKARLETH DELMONICO MRN: [id number] Date of Birth: 04-22-1941  Subjective/Objective:Noted CSW-patient declined SNF. Provided patient w/HHC agency list-chose [facility] rep Santiago Glad aware to follow-recc HHRN/PT/OT/CSW. Patient states family will manage transport home. Await [place] orders.                    Action/Plan:d/c home w/HHC.   Expected Discharge Date:  (unknown)               Expected Discharge Plan:  [place]  In-House Referral:  Clinical Social Work  Discharge planning Services  CM Consult  Post Acute Care Choice:  Durable Medical Equipment(Active w/[facility] home 02-has travel tank) Choice offered to:  Patient  DME Arranged:    DME Agency:     HH Arranged:    [place] Agency:     Status of Service:  In process, will continue to follow  If discussed at Long Length of Stay Meetings, dates discussed:    Additional Comments:  Dessa Phi, RN 08/13/2017, 12:21 PM

## 2017-08-13 NOTE — Progress Notes (Signed)
Pt Physical Therapy Treatment Patient Details Name: [name] MRN: [id number] DOB: 04-11-1941 Today's Date: 08/13/2017    History of Present Illness 77 yo female admitted with pulmonary edema, CHF exacerbation. Hx of COPD-O2 dep, HF, gout, chronic LE swelling, HTN, CKD, venous stasis.     PT Comments    Pt in bed on 2 lts at 96%.  Assisted OOB to attempt amb however pt was unable to fully WB thru R LE to advance l LE due to pain R bottom of foot.  Pt was only able to partial pivot to recliner.  RA decreased to 88%.    Follow Up Recommendations  SNF     Equipment Recommendations  None recommended by PT    Recommendations for Other Services       Precautions / Restrictions Precautions Precautions: Fall Precaution Comments: O2 dep Restrictions Weight Bearing Restrictions: No    Mobility  Bed Mobility Overal bed mobility: Needs Assistance Bed Mobility: Supine to Sit     Supine to sit: Min guard;HOB elevated     General bed mobility comments: able with increased time and use of rail  Transfers Overall transfer level: Needs assistance Equipment used: Rolling walker (2 wheeled) Transfers: Sit to/from [facility] Sit to Stand: Min assist;From elevated surface;Min guard Stand pivot transfers: Min assist       General transfer comment: pt performs sit to stand by shifting all upper body weight forward and pulling up on walker.    Ambulation/Gait Ambulation/Gait assistance: Min assist;Mod assist Ambulation Distance (Feet): 1 Feet Assistive device: Rolling walker (2 wheeled)       General Gait Details: pt unable to functional shift enough weight on R LE to advance L LE due to pain.  R knee buckled with attempt.  Pt was only able to take a few side steps to recliner with partial incomplete turn.     Stairs            Wheelchair Mobility    Modified Rankin (Stroke Patients Only)       Balance                                             Cognition Arousal/Alertness: Awake/alert Behavior During Therapy: WFL for tasks assessed/performed Overall Cognitive Status: Within Functional Limits for tasks assessed                                 General Comments: pt demonstartes low motivation and does not put forth full effort.  Immediately pt stated "I can't walk"      Exercises      General Comments        Pertinent Vitals/Pain Pain Assessment: Faces Faces Pain Scale: Hurts even more Pain Location: R plantar foot Pain Descriptors / Indicators: Tender;Sore;Grimacing Pain Intervention(s): Monitored during session;Repositioned;Ice applied    Home Living                      Prior Function            PT Goals (current goals can now be found in the care plan section) Progress towards PT goals: Progressing toward goals    Frequency    Min 3X/week      PT Plan Current plan remains appropriate    Co-evaluation  AM-PAC PT "6 Clicks" Daily Activity  Outcome Measure  Difficulty turning over in bed (including adjusting bedclothes, sheets and blankets)?: Unable Difficulty moving from lying on back to sitting on the side of the bed? : Unable Difficulty sitting down on and standing up from a chair with arms (e.g., wheelchair, bedside commode, etc,.)?: Unable Help needed moving to and from a bed to chair (including a wheelchair)?: A Little Help needed walking in hospital room?: A Little Help needed climbing 3-5 steps with a railing? : A Lot 6 Click Score: 11    End of Session Equipment Utilized During Treatment: Gait belt;Oxygen Activity Tolerance: Patient limited by pain Patient left: with call bell/phone within reach;in chair;with chair alarm set   PT Visit Diagnosis: Muscle weakness (generalized) (M62.81);Difficulty in walking, not elsewhere classified (R26.2);Pain Pain - Right/Left: Right Pain - part of body: Ankle and joints of foot      Time: 8421-0312 PT Time Calculation (min) (ACUTE ONLY): 25 min  Charges:  $Therapeutic Activity: 23-37 mins                    G Codes:       Rica Koyanagi  PTA [facility]  Acute  Rehab Pager      [phone]

## 2017-08-13 NOTE — Clinical Social Work Note (Signed)
Clinical Social Work Assessment  Patient Details  Name: [name] MRN: [id number] Date of Birth: 1940/08/27  Date of referral:  08/13/17               Reason for consult:  Facility Placement                Permission sought to share information with:  Case Manager Permission granted to share information::  Yes, Verbal Permission Granted  Name::        Agency::     Relationship::     Contact Information:     Housing/Transportation Living arrangements for the past 2 months:  Single Family Home Source of Information:  Patient Patient Interpreter Needed:  None Criminal Activity/Legal Involvement Pertinent to Current Situation/Hospitalization:  No - Comment as needed Significant Relationships:  Adult Children, Spouse Lives with:  Spouse Do you feel safe going back to the place where you live?  Yes Need for family participation in patient care:  No (Coment)  Care giving concerns:  Patient from home with husband. Patient reported that prior to hospitalization that she was independent with ADLs and uses a cane to assist with ambulation. PT recommending SNF for ST rehab.   Social Worker assessment / plan:  CSW spoke with patient at bedside regarding discharge planning, patient's son present. CSW informed patient that PT recommended SNF for ST rehab. Patient reported that she is not interested in going to SNF for ST rehab. CSW explained ST rehab at SNF, patient reiterated that she is not interested. Patient's son reported that patient's husband is expecting patient to dc home. CSW inquired if patient is interested in home health services, patient reported that she is agreeable to home health services. CSW agreed to notify patient's RNCM.  CSW informed patient's RNCM that patient is interested in home health services.  Employment status:  Retired Nurse, adult PT Recommendations:  [place] / Referral to community resources:  Other  (Comment Required)(RNCM referral for home health services)  Patient/Family's Response to care:  Patient declined SNF for ST rehab. Patient agreeable to home health services.   Patient/Family's Understanding of and Emotional Response to Diagnosis, Current Treatment, and Prognosis:  Patient verbalized understanding of diagnosis and current treatment. Patient reported that she has support at home from both her husband and oldest daughter and is not interested in SNF. Patient verbalized plan to dc home with home health services.   Emotional Assessment Appearance:  Appears stated age Attitude/Demeanor/Rapport:  Other(Open) Affect (typically observed):  Calm Orientation:  Oriented to Self, Oriented to Place, Oriented to  Time, Oriented to Situation Alcohol / Substance use:  Not Applicable Psych involvement (Current and /or in the community):  No (Comment)  Discharge Needs  Concerns to be addressed:  Care Coordination Readmission within the last 30 days:  No Current discharge risk:  Physical Impairment Barriers to Discharge:  No Barriers Identified   Laurie Medin, LCSW 08/13/2017, 3:17 PM

## 2017-08-14 DIAGNOSIS — I10 Essential (primary) hypertension: Secondary | ICD-10-CM

## 2017-08-14 DIAGNOSIS — N179 Acute kidney failure, unspecified: Secondary | ICD-10-CM

## 2017-08-14 DIAGNOSIS — I5033 Acute on chronic diastolic (congestive) heart failure: Secondary | ICD-10-CM

## 2017-08-14 DIAGNOSIS — N189 Chronic kidney disease, unspecified: Secondary | ICD-10-CM

## 2017-08-14 LAB — CBC WITH DIFFERENTIAL/PLATELET
BASOS ABS: 0 10*3/uL (ref 0.0–0.1)
Basophils Relative: 0 %
Eosinophils Absolute: 0 10*3/uL (ref 0.0–0.7)
Eosinophils Relative: 0 %
HEMATOCRIT: 32.9 % — AB (ref 36.0–46.0)
HEMOGLOBIN: 10.2 g/dL — AB (ref 12.0–15.0)
LYMPHS PCT: 9 %
Lymphs Abs: 1 10*3/uL (ref 0.7–4.0)
MCH: 24.7 pg — ABNORMAL LOW (ref 26.0–34.0)
MCHC: 31 g/dL (ref 30.0–36.0)
MCV: 79.7 fL (ref 78.0–100.0)
MONOS PCT: 3 %
Monocytes Absolute: 0.3 10*3/uL (ref 0.1–1.0)
NEUTROS PCT: 88 %
Neutro Abs: 9.5 10*3/uL — ABNORMAL HIGH (ref 1.7–7.7)
Platelets: 217 10*3/uL (ref 150–400)
RBC: 4.13 MIL/uL (ref 3.87–5.11)
RDW: 21.6 % — ABNORMAL HIGH (ref 11.5–15.5)
WBC: 10.8 10*3/uL — AB (ref 4.0–10.5)

## 2017-08-14 LAB — BASIC METABOLIC PANEL
ANION GAP: 8 (ref 5–15)
BUN: 64 mg/dL — ABNORMAL HIGH (ref 6–20)
CO2: 31 mmol/L (ref 22–32)
Calcium: 9.5 mg/dL (ref 8.9–10.3)
Chloride: 94 mmol/L — ABNORMAL LOW (ref 101–111)
Creatinine, Ser: 1.63 mg/dL — ABNORMAL HIGH (ref 0.44–1.00)
GFR calc non Af Amer: 29 mL/min — ABNORMAL LOW (ref >60)
GFR, EST AFRICAN AMERICAN: 34 mL/min — AB (ref >60)
GLUCOSE: 173 mg/dL — AB (ref 65–99)
POTASSIUM: 4.5 mmol/L (ref 3.5–5.1)
Sodium: 133 mmol/L — ABNORMAL LOW (ref 135–145)

## 2017-08-14 MED ORDER — TORSEMIDE 20 MG PO TABS
40.0000 mg | ORAL_TABLET | Freq: Every day | ORAL | Status: DC
  Administered 2017-08-14 – 2017-08-15 (×2): 40 mg via ORAL
  Filled 2017-08-14 (×2): qty 2

## 2017-08-14 NOTE — Plan of Care (Signed)
  Clinical Measurements: Cardiovascular complication will be avoided 08/14/2017 0258 - Progressing by Ashley Murrain, RN

## 2017-08-14 NOTE — Plan of Care (Signed)
  Elimination: Will not experience complications related to urinary retention 08/14/2017 2319 - Progressing by Ashley Murrain, RN   Cardiac: Ability to achieve and maintain adequate cardiopulmonary perfusion will improve 08/14/2017 2319 - Progressing by Ashley Murrain, RN

## 2017-08-14 NOTE — Progress Notes (Addendum)
MD paged to make aware that patient unable to void since foley removed at 0930, bladder scan x 2 did not pick up on her, demedex given off schedule at 75  MD returned call at 1713, stated to wait afew hours to see if demadex works, if no void by 2000, bladder scan

## 2017-08-14 NOTE — Progress Notes (Signed)
PROGRESS NOTE        PATIENT DETAILS Name: [name] Age: 77 y.o. Sex: female Date of Birth: 12/02/1940 Admit Date: 08/08/2017 Admitting Physician Eugenie Filler, MD TDD:UKGUR, Arvid Right, MD  Brief Narrative: Patient is a 77 y.o. female with past medical history of chronic diastolic heart failure, chronic GI bleeding secondary to AVMs-presented to the hospital for evaluation of low hemoglobin, and decompensated diastolic heart failure.  She was transfused PRBC 2 units, and started on IV diuretics.  Hospital course has been complicated by development of acute kidney injury.  See below for further details.  Subjective: Breathing is somewhat improved but noted back to her baseline.  Assessment/Plan: Acute on chronic diastolic heart failure: Volume status improved-but weight creeping up-she still has peripheral edema-stop IV fluids-restart diuretics-we will start Demadex 40 mg daily.  Acute kidney injury disease stage III: Likely hemodynamically mediated-improving with supportive care.  Restarting diuretics.  Severe anemia due to chronic GI bleeding: Status post 2 units of PRBC-hemoglobin currently stable.  She does not give a history of melena/hematochezia overt GI bleeding.  GI consulted-recommendations are for supportive care, per GI-endoscopic evaluation only indicated when patient develops overt GI bleeding.  Probable gouty arthritis: Did complain of bilateral foot pain-responding to steroids.  Hypertension: Controlled continue BiDil and metoprolol.  COPD with chronic hypoxemic respiratory failure: Lungs are clear-stable-continue bronchodilators  Ulcerative colitis: Stable-continue outpatient GI follow-up  Morbid obesity: Counseled  Acute on chronic debility: Seen by PT eval-recommendations of SNF-however patient refuses and wants to go home with home health services.  DVT Prophylaxis: SCD's  Code Status:  DNR  Family Communication: None  at bedside  Disposition Plan: Remain inpatient-but will plan on Home health likely tomorrow morning  Antimicrobial agents: Anti-infectives (From admission, onward)   None      Procedures: None  CONSULTS:None  GI  Time spent: 25 minutes-Greater than 50% of this time was spent in counseling, explanation of diagnosis, planning of further management, and coordination of care.  MEDICATIONS: Scheduled Meds: . allopurinol  100 mg Oral Daily  . famotidine  20 mg Oral Daily  . fluticasone furoate-vilanterol  1 puff Inhalation Daily  . gabapentin  100 mg Oral TID  . isosorbide-hydrALAZINE  1 tablet Oral TID  . metoprolol tartrate  25 mg Oral BID  . polyethylene glycol  17 g Oral Daily  . predniSONE  40 mg Oral QAC breakfast  . sodium chloride flush  3 mL Intravenous Q12H  . torsemide  40 mg Oral Daily  . cyanocobalamin  2,000 mcg Oral Daily   Continuous Infusions: PRN Meds:.acetaminophen **OR** acetaminophen, albuterol, hydrALAZINE, ondansetron **OR** ondansetron (ZOFRAN) IV   PHYSICAL EXAM: Vital signs: Vitals:   08/14/17 0616 08/14/17 0708 08/14/17 1015 08/14/17 1302  BP: (!) 153/42   (!) 152/32  Pulse: 74   66  Resp: 18   19  Temp: 97.9 F (36.6 C)   98.2 F (36.8 C)  TempSrc: Oral   Oral  SpO2: 97%  94% 97%  Weight:  93.6 kg (206 lb 4.8 oz)    Height:       Filed Weights   08/12/17 0608 08/13/17 0619 08/14/17 0708  Weight: 92 kg (202 lb 13.2 oz) 91.2 kg (201 lb 1.6 oz) 93.6 kg (206 lb 4.8 oz)   Body mass index is 40.29 kg/m.   General appearance :  Awake, alert, not in any distress. Speech Clear.  Chronically sick appearing Eyes:, pupils equally reactive to light and accomodation HEENT: Atraumatic and Normocephalic Neck: supple, no JVD. No cervical lymphadenopathy. No thyromegaly Resp:Good air entry bilaterally, bibasilar rales CVS: S1 S2 regular GI: Bowel sounds present, Non tender and not distended with no gaurding, rigidity or rebound.No  organomegaly Extremities: B/L Lower Ext shows + edema, both legs are warm to touch Neurology:  speech clear,Non focal, sensation is grossly intact. Musculoskeletal:No digital cyanosis Skin:No Rash, warm and dry Wounds:N/A  I have personally reviewed following labs and imaging studies  LABORATORY DATA: CBC: Recent Labs  Lab 08/08/17 1622  08/10/17 0518 08/11/17 0901 08/12/17 0506 08/13/17 0601 08/14/17 0450  WBC 10.2   < > 9.8 11.0* 12.8* 12.7* 10.8*  NEUTROABS 7.2  --   --  8.5* 8.9* 8.5* 9.5*  HGB 5.9 Repeated and verified X2.*   < > 7.9* 11.3*  11.1* 10.5* 10.0* 10.2*  HCT 19.5 Repeated and verified X2.*   < > 26.6* 35.5*  35.3* 33.9* 32.6* 32.9*  MCV 72.0*   < > 76.2* 79.1 80.0 79.9 79.7  PLT 274.0   < > 225 214 218 206 217   < > = values in this interval not displayed.    Basic Metabolic Panel: Recent Labs  Lab 08/10/17 0518 08/11/17 0901 08/12/17 0506 08/13/17 0601 08/14/17 0450  NA 138 136 134* 132* 133*  K 4.0 3.3* 3.7 4.1 4.5  CL 101 92* 94* 90* 94*  CO2 31 32 30 32 31  GLUCOSE 94 111* 99 108* 173*  BUN 17 24* 36* 52* 64*  CREATININE 1.43* 1.60* 2.36* 2.32* 1.63*  CALCIUM 9.5 9.6 9.4 9.1 9.5  MG 2.2 2.1 2.1  --   --     GFR: Estimated Creatinine Clearance: 29.5 mL/min (A) (by C-G formula based on SCr of 1.63 mg/dL (H)).  Liver Function Tests: Recent Labs  Lab 08/08/17 1622 08/08/17 2047  AST 12 17  ALT 7 11*  ALKPHOS 53 55  BILITOT 0.4 0.6  PROT 7.5 7.1  ALBUMIN 3.5 3.0*   No results for input(s): LIPASE, AMYLASE in the last 168 hours. No results for input(s): AMMONIA in the last 168 hours.  Coagulation Profile: Recent Labs  Lab 08/08/17 2051  INR 1.07    Cardiac Enzymes: Recent Labs  Lab 08/08/17 2051 08/09/17 0247 08/09/17 0855  TROPONINI <0.03 <0.03 <0.03    BNP (last 3 results) Recent Labs    08/08/17 1622  PROBNP 304.0*    HbA1C: No results for input(s): HGBA1C in the last 72 hours.  CBG: No results for  input(s): GLUCAP in the last 168 hours.  Lipid Profile: No results for input(s): CHOL, HDL, LDLCALC, TRIG, CHOLHDL, LDLDIRECT in the last 72 hours.  Thyroid Function Tests: No results for input(s): TSH, T4TOTAL, FREET4, T3FREE, THYROIDAB in the last 72 hours.  Anemia Panel: No results for input(s): VITAMINB12, FOLATE, FERRITIN, TIBC, IRON, RETICCTPCT in the last 72 hours.  Urine analysis:    Component Value Date/Time   COLORURINE YELLOW 08/08/2017 1622   APPEARANCEUR CLEAR 08/08/2017 1622   LABSPEC 1.010 08/08/2017 1622   PHURINE 7.5 08/08/2017 1622   GLUCOSEU NEGATIVE 08/08/2017 1622   HGBUR NEGATIVE 08/08/2017 1622   HGBUR moderate 02/02/2008 1439   BILIRUBINUR NEGATIVE 08/08/2017 1622   KETONESUR NEGATIVE 08/08/2017 1622   PROTEINUR 100 (A) 06/01/2013 1722   UROBILINOGEN 0.2 08/08/2017 1622   NITRITE NEGATIVE 08/08/2017 1622   LEUKOCYTESUR NEGATIVE  08/08/2017 1622    Sepsis Labs: Lactic Acid, Venous    Component Value Date/Time   LATICACIDVEN 0.75 11/08/2016 1822    MICROBIOLOGY: Recent Results (from the past 240 hour(s))  CULTURE, URINE COMPREHENSIVE     Status: Abnormal   Collection Time: 08/08/17  4:22 PM  Result Value Ref Range Status   MICRO NUMBER: [id number]  Final   SPECIMEN QUALITY: ADEQUATE  Final   Source URINE  Final   STATUS: FINAL  Final   ISOLATE 1: Lactobacillus species (A)  Final    RADIOLOGY STUDIES/RESULTS: Dg Chest 2 View  Result Date: 08/08/2017 CLINICAL DATA:  Shortness of breath and low hemoglobin. EXAM: CHEST  2 VIEW COMPARISON:  07/18/2017 FINDINGS: Lungs are adequately inflated demonstrate mild hazy prominence of the perihilar and bibasilar regions without significant change likely mild vascular congestion. Small bilateral pleural effusions unchanged to slightly worse. Mild stable cardiomegaly. Remainder of the exam is unchanged. IMPRESSION: Findings suggesting mild CHF with slightly worse small bilateral pleural effusions. Electronically  Signed   By: Marin Olp M.D.   On: 08/08/2017 19:42   Dg Chest 2 View  Result Date: 07/18/2017 CLINICAL DATA:  Cough, congestion, and shortness of breath for the past 2 weeks. EXAM: CHEST  2 VIEW COMPARISON:  Chest x-ray dated November 08, 2016. FINDINGS: Stable mild cardiomegaly. Increased interstitial markings, basal predominant. Small left pleural effusion. No consolidation or pneumothorax. No acute osseous abnormality. IMPRESSION: 1. Mild pulmonary interstitial edema and small left pleural effusion. Electronically Signed   By: Titus Dubin M.D.   On: 07/18/2017 15:23   Dg Chest Port 1 View  Result Date: 08/10/2017 CLINICAL DATA:  Shortness of Breath EXAM: PORTABLE CHEST 1 VIEW COMPARISON:  08/09/2017 FINDINGS: There is hyperinflation of the lungs compatible with COPD. Cardiomegaly with vascular congestion. Bibasilar atelectasis and small effusions noted. Findings similar to prior study. IMPRESSION: Continued cardiomegaly, vascular congestion with bibasilar atelectasis and effusions. No real change. Electronically Signed   By: Rolm Baptise M.D.   On: 08/10/2017 11:14   Dg Chest Port 1 View  Result Date: 08/09/2017 CLINICAL DATA:  77 year old female with CHF. EXAM: PORTABLE CHEST 1 VIEW COMPARISON:  Chest radiograph dated 08/08/2017. FINDINGS: Small bilateral effusions with associated atelectatic changes of the lung bases, grossly similar to prior radiograph. Pneumonia is not excluded. Clinical correlation is recommended. No pneumothorax. Stable mild cardiomegaly. Atherosclerotic calcification of the aorta. No acute osseous pathology. IMPRESSION: No significant change in the bilateral pleural effusions are atelectatic changes of the lung bases. Clinical correlation and follow-up recommended. Electronically Signed   By: Anner Crete M.D.   On: 08/09/2017 05:33     LOS: 6 days   Oren Binet, MD  [facility] Pager:[phone]  If 7PM-7AM, please contact  night-coverage [email or web address] Password [email or web address] 08/14/2017, 1:44 PM

## 2017-08-15 ENCOUNTER — Ambulatory Visit: Payer: Medicare Other | Admitting: Internal Medicine

## 2017-08-15 ENCOUNTER — Other Ambulatory Visit: Payer: Self-pay | Admitting: Internal Medicine

## 2017-08-15 LAB — BASIC METABOLIC PANEL
Anion gap: 8 (ref 5–15)
BUN: 70 mg/dL — AB (ref 6–20)
CHLORIDE: 95 mmol/L — AB (ref 101–111)
CO2: 31 mmol/L (ref 22–32)
CREATININE: 1.53 mg/dL — AB (ref 0.44–1.00)
Calcium: 9.8 mg/dL (ref 8.9–10.3)
GFR calc non Af Amer: 32 mL/min — ABNORMAL LOW (ref >60)
GFR, EST AFRICAN AMERICAN: 37 mL/min — AB (ref >60)
Glucose, Bld: 138 mg/dL — ABNORMAL HIGH (ref 65–99)
POTASSIUM: 4.3 mmol/L (ref 3.5–5.1)
Sodium: 134 mmol/L — ABNORMAL LOW (ref 135–145)

## 2017-08-15 MED ORDER — TORSEMIDE 20 MG PO TABS: 40.0000 mg | ORAL_TABLET | Freq: Every day | ORAL | 0 refills | Status: DC

## 2017-08-15 MED ORDER — METOPROLOL TARTRATE 50 MG PO TABS: 25.0000 mg | ORAL_TABLET | Freq: Two times a day (BID) | ORAL | 0 refills | Status: DC

## 2017-08-15 MED ORDER — ISOSORB DINITRATE-HYDRALAZINE 20-37.5 MG PO TABS: 1.0000 | ORAL_TABLET | Freq: Three times a day (TID) | ORAL | 0 refills | Status: DC

## 2017-08-15 MED ORDER — PREDNISONE 10 MG PO TABS: ORAL_TABLET | ORAL | 0 refills | Status: DC

## 2017-08-15 NOTE — Discharge Summary (Signed)
PATIENT DETAILS Name: [name] Age: 77 y.o. Sex: female Date of Birth: 08/24/1940 MRN: [id number]. Admitting Physician: Eugenie Filler, MD ORV:[id number], Arvid Right, MD  Admit Date: 08/08/2017 Discharge date: 08/15/2017  Recommendations for Outpatient Follow-up:  1. Follow up with PCP in 1-2 weeks 2. Please obtain BMP/CBC in one week  Admitted From:  Home  Disposition: Home with home health services   [place]:  Yes (note-refused SNF)  Equipment/Devices: Continue oxygen 2 L/min  Discharge Condition: Stable  CODE STATUS: DNR  Diet recommendation:  Heart Healthy  Brief Summary: See H&P, Labs, Consult and Test reports for all details in brief,Patient is a 77 y.o. female with past medical history of chronic diastolic heart failure, chronic GI bleeding secondary to AVMs-presented to the hospital for evaluation of low hemoglobin, and decompensated diastolic heart failure.  She was transfused PRBC 2 units, and started on IV diuretics.  Hospital course has been complicated by development of acute kidney injury.  See below for further details.   Brief Hospital Course: Acute on chronic diastolic heart failure: Managed with IV diuretic regimen-however hospital course complicated by acute kidney injury.  Diuretics were held-briefly hydrated.  Diuretics were restarted yesterday, weight down to 205 pounds from a peak of 209 pounds on admission.  Would avoid metolazone and ACE inhibitor for now, and will continue with torsemide 40 mg daily that was started on 1/16.  Follow volume status and weights closely in the outpatient setting.  Follow electrolytes-and adjust diuretic regimen as needed.    Acute kidney injury disease stage III: Likely hemodynamically mediated-improving with supportive care.    Diuretics have been restarted-continue to follow closely in the outpatient setting.  Severe anemia due to chronic GI bleeding: Status post 2 units of PRBC-hemoglobin currently  stable.  She does not give a history of melena/hematochezia overt GI bleeding.  GI consulted-recommendations are for supportive care, per GI-endoscopic evaluation only indicated when patient develops overt GI bleeding.  Continue iron supplementation.  Probable gouty arthritis: Did complain of bilateral foot pain-responding to steroids-we will taper in the outpatient setting in a matter of a few days.  Hypertension: Controlled continue BiDil and metoprolol.  Would avoid ACE inhibitor or ARB at this time.  COPD with chronic hypoxemic respiratory failure: Lungs are clear-stable-continue bronchodilators  Ulcerative colitis: Stable-continue outpatient GI follow-up  Morbid obesity: Counseled  Acute on chronic debility: Seen by PT eval-recommendations of SNF-however patient refuses and wants to go home with home health services.   Procedures/Studies: None  Discharge Diagnoses:  Principal Problem:   Pulmonary edema Active Problems:   Hyperlipidemia with target LDL less than 100   Morbid obesity (HCC)   HTN (hypertension)   GERD   CKD (chronic kidney disease), stage III (HCC)   Chronic venous stasis dermatitis of both lower extremities   Anemia, iron deficiency   Grade III diastolic dysfunction   Angiodysplasia of cecum   Angiodysplasia of duodenum   CHF exacerbation (HCC)   GI bleed   Symptomatic anemia   Hypertensive emergency   Acute kidney injury superimposed on CKD ([place]) Stage III   Discharge Instructions:  Activity:  As tolerated with Full fall precautions use walker/cane & assistance as needed  Discharge Instructions    (HEART FAILURE PATIENTS) Call MD:  Anytime you have any of the following symptoms: 1) 3 pound weight gain in 24 hours or 5 pounds in 1 week 2) shortness of breath, with or without a dry hacking cough 3) swelling in the  hands, feet or stomach 4) if you have to sleep on extra pillows at night in order to breathe.   Complete by:  As directed    Call MD  for:  difficulty breathing, headache or visual disturbances   Complete by:  As directed    Call MD for:  persistant nausea and vomiting   Complete by:  As directed    Diet - low sodium heart healthy   Complete by:  As directed    Discharge instructions   Complete by:  As directed    You have Congestive Heart Failure: Please call your Cardiologist or Primary MD-Anytime you have any of the following symptoms:   1) 3 pound weight gain in 24 hours or 5 pounds in 1 week  2) shortness of breath, with or without a dry hacking cough 3) swelling in the hands, feet or stomach 4) if you have to sleep on extra pillows at night in order to breathe  Follow cardiac low salt diet and 1.5 lit/day fluid restriction.    Follow with Primary MD  [name], MD  and other consultant's as instructed your Hospitalist MD  Please get a complete blood count and chemistry panel checked by your Primary MD at your next visit, and again as instructed by your Primary MD.  Get Medicines reviewed and adjusted: Please take all your medications with you for your next visit with your Primary MD  Laboratory/radiological data: Please request your Primary MD to go over all hospital tests and procedure/radiological results at the follow up, please ask your Primary MD to get all Hospital records sent to his/her office.  In some cases, they will be blood work, cultures and biopsy results pending at the time of your discharge. Please request that your primary care M.D. follows up on these results.  Also Note the following: If you experience worsening of your admission symptoms, develop shortness of breath, life threatening emergency, suicidal or homicidal thoughts you must seek medical attention immediately by calling 911 or calling your MD immediately  if symptoms less severe.  You must read complete instructions/literature along with all the possible adverse reactions/side effects for all the Medicines you take and  that have been prescribed to you. Take any new Medicines after you have completely understood and accpet all the possible adverse reactions/side effects.   Do not drive when taking Pain medications or sleeping medications (Benzodaizepines)  Do not take more than prescribed Pain, Sleep and Anxiety Medications. It is not advisable to combine anxiety,sleep and pain medications without talking with your primary care practitioner  Special Instructions: If you have smoked or chewed Tobacco  in the last 2 yrs please stop smoking, stop any regular Alcohol  and or any Recreational drug use.  Wear Seat belts while driving.  Please note: You were cared for by a hospitalist during your hospital stay. Once you are discharged, your primary care physician will handle any further medical issues. Please note that NO REFILLS for any discharge medications will be authorized once you are discharged, as it is imperative that you return to your primary care physician (or establish a relationship with a primary care physician if you do not have one) for your post hospital discharge needs so that they can reassess your need for medications and monitor your lab values.   Increase activity slowly   Complete by:  As directed      Allergies as of 08/15/2017      Reactions   Celebrex [  celecoxib]    edema   Enalapril Cough   Lipitor [atorvastatin]    Muscle aches   Amlodipine Besylate    REACTION: edema   Amoxicillin    REACTION: Diarrhea   Codeine Sulfate    REACTION: Nausea   Hydrocodone-acetaminophen    REACTION: Nausea      Medication List    STOP taking these medications   aspirin 81 MG EC tablet   Azilsartan Medoxomil 40 MG Tabs Commonly known as:  EDARBI   metolazone 5 MG tablet Commonly known as:  ZAROXOLYN     TAKE these medications   acetaminophen 325 MG tablet Commonly known as:  TYLENOL Take 650 mg by mouth every 6 (six) hours as needed for moderate pain, fever or headache.   albuterol  108 (90 Base) MCG/ACT inhaler Commonly known as:  PROAIR HFA Inhale 1-2 puffs into the lungs every 6 (six) hours as needed for wheezing or shortness of breath.   albuterol (2.5 MG/3ML) 0.083% nebulizer solution Commonly known as:  PROVENTIL Take 3 mLs (2.5 mg total) by nebulization every 6 (six) hours as needed for wheezing or shortness of breath.   allopurinol 100 MG tablet Commonly known as:  ZYLOPRIM TAKE 1 TABLET BY MOUTH EVERY DAY FOR GOUT   cyanocobalamin 2000 MCG tablet Take 1 tablet (2,000 mcg total) by mouth daily. What changed:  when to take this   ferrous sulfate 325 (65 FE) MG tablet Take 1 tablet (325 mg total) by mouth 2 (two) times daily with a meal.   fluticasone furoate-vilanterol 100-25 MCG/INH Aepb Commonly known as:  BREO ELLIPTA Inhale 1 puff into the lungs daily.   hydrocerin Crea Apply 1 application topically as directed.   isosorbide-hydrALAZINE 20-37.5 MG tablet Commonly known as:  BIDIL Take 1 tablet by mouth 3 (three) times daily.   metoprolol tartrate 50 MG tablet Commonly known as:  LOPRESSOR Take 0.5 tablets (25 mg total) by mouth 2 (two) times daily. What changed:  how much to take   predniSONE 10 MG tablet Commonly known as:  DELTASONE Take 4 tablets (40 mg) daily for 1 day, then, Take 3 tablets (30 mg) daily for 1 day, then, Take 2 tablets (20 mg) daily for 1 day, then, Take 1 tablets (10 mg) daily for 1 day, then stop   promethazine-dextromethorphan 6.25-15 MG/5ML syrup Commonly known as:  PROMETHAZINE-DM Take 5 mLs by mouth 4 (four) times daily as needed for cough.   torsemide 20 MG tablet Commonly known as:  DEMADEX Take 2 tablets (40 mg total) by mouth daily. What changed:    how much to take  when to take this   VASCULERA Tabs Take 1 tablet by mouth daily.   Vitamin D (Ergocalciferol) 50000 units Caps capsule Commonly known as:  DRISDOL TAKE ONE CAPSULE BY MOUTH ONCE WEEKLY      Follow-up Information    [name], MD. Schedule an appointment as soon as possible for a visit in 1 week(s).   Specialty:  Internal Medicine Contact information: [address] [place] [address] [phone]          Allergies  Allergen Reactions  . Celebrex [Celecoxib]     edema  . Enalapril Cough  . Lipitor [Atorvastatin]     Muscle aches  . Amlodipine Besylate     REACTION: edema  . Amoxicillin     REACTION: Diarrhea  . Codeine Sulfate     REACTION: Nausea  . Hydrocodone-Acetaminophen  REACTION: Nausea    Consultations:   None  Other Procedures/Studies: Dg Chest 2 View  Result Date: 08/08/2017 CLINICAL DATA:  Shortness of breath and low hemoglobin. EXAM: CHEST  2 VIEW COMPARISON:  07/18/2017 FINDINGS: Lungs are adequately inflated demonstrate mild hazy prominence of the perihilar and bibasilar regions without significant change likely mild vascular congestion. Small bilateral pleural effusions unchanged to slightly worse. Mild stable cardiomegaly. Remainder of the exam is unchanged. IMPRESSION: Findings suggesting mild CHF with slightly worse small bilateral pleural effusions. Electronically Signed   By: Marin Olp M.D.   On: 08/08/2017 19:42   Dg Chest 2 View  Result Date: 07/18/2017 CLINICAL DATA:  Cough, congestion, and shortness of breath for the past 2 weeks. EXAM: CHEST  2 VIEW COMPARISON:  Chest x-ray dated November 08, 2016. FINDINGS: Stable mild cardiomegaly. Increased interstitial markings, basal predominant. Small left pleural effusion. No consolidation or pneumothorax. No acute osseous abnormality. IMPRESSION: 1. Mild pulmonary interstitial edema and small left pleural effusion. Electronically Signed   By: Titus Dubin M.D.   On: 07/18/2017 15:23   Dg Chest Port 1 View  Result Date: 08/10/2017 CLINICAL DATA:  Shortness of Breath EXAM: PORTABLE CHEST 1 VIEW COMPARISON:  08/09/2017 FINDINGS: There is hyperinflation of the lungs compatible with COPD.  Cardiomegaly with vascular congestion. Bibasilar atelectasis and small effusions noted. Findings similar to prior study. IMPRESSION: Continued cardiomegaly, vascular congestion with bibasilar atelectasis and effusions. No real change. Electronically Signed   By: Rolm Baptise M.D.   On: 08/10/2017 11:14   Dg Chest Port 1 View  Result Date: 08/09/2017 CLINICAL DATA:  77 year old female with CHF. EXAM: PORTABLE CHEST 1 VIEW COMPARISON:  Chest radiograph dated 08/08/2017. FINDINGS: Small bilateral effusions with associated atelectatic changes of the lung bases, grossly similar to prior radiograph. Pneumonia is not excluded. Clinical correlation is recommended. No pneumothorax. Stable mild cardiomegaly. Atherosclerotic calcification of the aorta. No acute osseous pathology. IMPRESSION: No significant change in the bilateral pleural effusions are atelectatic changes of the lung bases. Clinical correlation and follow-up recommended. Electronically Signed   By: Anner Crete M.D.   On: 08/09/2017 05:33      TODAY-DAY OF DISCHARGE:  Subjective:   [name] today has no headache,no chest abdominal pain,no new weakness tingling or numbness, feels much better wants to go home today.   Objective:   Blood pressure (!) 148/36, pulse 66, temperature 97.7 F (36.5 C), temperature source Oral, resp. rate 17, height 5' (1.524 m), weight 93 kg (205 lb 0.4 oz), SpO2 99 %.  Intake/Output Summary (Last 24 hours) at 08/15/2017 0749 Last data filed at 08/15/2017 0554 Gross per 24 hour  Intake 1200 ml  Output 2500 ml  Net -1300 ml   Filed Weights   08/13/17 0619 08/14/17 0708 08/15/17 0600  Weight: 91.2 kg (201 lb 1.6 oz) 93.6 kg (206 lb 4.8 oz) 93 kg (205 lb 0.4 oz)    Exam: Awake Alert, Oriented *3, No new F.N deficits, Normal affect [place].AT,PERRAL Supple Neck,No JVD, No cervical lymphadenopathy appriciated.  Symmetrical Chest wall movement, Good air movement bilaterally, CTAB RRR,No Gallops,Rubs or  new Murmurs, No Parasternal Heave +ve B.Sounds, Abd Soft, Non tender, No organomegaly appriciated, No rebound -guarding or rigidity. No Cyanosis, Clubbing or edema, No new Rash or bruise   PERTINENT RADIOLOGIC STUDIES: Dg Chest 2 View  Result Date: 08/08/2017 CLINICAL DATA:  Shortness of breath and low hemoglobin. EXAM: CHEST  2 VIEW COMPARISON:  07/18/2017 FINDINGS: Lungs are adequately inflated demonstrate  mild hazy prominence of the perihilar and bibasilar regions without significant change likely mild vascular congestion. Small bilateral pleural effusions unchanged to slightly worse. Mild stable cardiomegaly. Remainder of the exam is unchanged. IMPRESSION: Findings suggesting mild CHF with slightly worse small bilateral pleural effusions. Electronically Signed   By: Marin Olp M.D.   On: 08/08/2017 19:42   Dg Chest 2 View  Result Date: 07/18/2017 CLINICAL DATA:  Cough, congestion, and shortness of breath for the past 2 weeks. EXAM: CHEST  2 VIEW COMPARISON:  Chest x-ray dated November 08, 2016. FINDINGS: Stable mild cardiomegaly. Increased interstitial markings, basal predominant. Small left pleural effusion. No consolidation or pneumothorax. No acute osseous abnormality. IMPRESSION: 1. Mild pulmonary interstitial edema and small left pleural effusion. Electronically Signed   By: Titus Dubin M.D.   On: 07/18/2017 15:23   Dg Chest Port 1 View  Result Date: 08/10/2017 CLINICAL DATA:  Shortness of Breath EXAM: PORTABLE CHEST 1 VIEW COMPARISON:  08/09/2017 FINDINGS: There is hyperinflation of the lungs compatible with COPD. Cardiomegaly with vascular congestion. Bibasilar atelectasis and small effusions noted. Findings similar to prior study. IMPRESSION: Continued cardiomegaly, vascular congestion with bibasilar atelectasis and effusions. No real change. Electronically Signed   By: Rolm Baptise M.D.   On: 08/10/2017 11:14   Dg Chest Port 1 View  Result Date: 08/09/2017 CLINICAL DATA:   77 year old female with CHF. EXAM: PORTABLE CHEST 1 VIEW COMPARISON:  Chest radiograph dated 08/08/2017. FINDINGS: Small bilateral effusions with associated atelectatic changes of the lung bases, grossly similar to prior radiograph. Pneumonia is not excluded. Clinical correlation is recommended. No pneumothorax. Stable mild cardiomegaly. Atherosclerotic calcification of the aorta. No acute osseous pathology. IMPRESSION: No significant change in the bilateral pleural effusions are atelectatic changes of the lung bases. Clinical correlation and follow-up recommended. Electronically Signed   By: Anner Crete M.D.   On: 08/09/2017 05:33     PERTINENT LAB RESULTS: CBC: Recent Labs    08/13/17 0601 08/14/17 0450  WBC 12.7* 10.8*  HGB 10.0* 10.2*  HCT 32.6* 32.9*  PLT 206 217   CMET CMP     Component Value Date/Time   NA 134 (L) 08/15/2017 0534   NA 142 04/11/2016 1007   K 4.3 08/15/2017 0534   K 3.3 (L) 04/11/2016 1007   CL 95 (L) 08/15/2017 0534   CO2 31 08/15/2017 0534   CO2 31 (H) 04/11/2016 1007   GLUCOSE 138 (H) 08/15/2017 0534   GLUCOSE 78 04/11/2016 1007   BUN 70 (H) 08/15/2017 0534   BUN 24.4 04/11/2016 1007   CREATININE 1.53 (H) 08/15/2017 0534   CREATININE 1.1 04/11/2016 1007   CALCIUM 9.8 08/15/2017 0534   CALCIUM 9.8 04/11/2016 1007   PROT 7.1 08/08/2017 2047   PROT 6.9 04/11/2016 1007   PROT 7.5 04/11/2016 1007   ALBUMIN 3.0 (L) 08/08/2017 2047   ALBUMIN 3.0 (L) 04/11/2016 1007   AST 17 08/08/2017 2047   AST 12 04/11/2016 1007   ALT 11 (L) 08/08/2017 2047   ALT 12 04/11/2016 1007   ALKPHOS 55 08/08/2017 2047   ALKPHOS 82 04/11/2016 1007   BILITOT 0.6 08/08/2017 2047   BILITOT 0.35 04/11/2016 1007   GFRNONAA 32 (L) 08/15/2017 0534   GFRAA 37 (L) 08/15/2017 0534    GFR Estimated Creatinine Clearance: 31.4 mL/min (A) (by C-G formula based on SCr of 1.53 mg/dL (H)). No results for input(s): LIPASE, AMYLASE in the last 72 hours. No results for input(s):  CKTOTAL, CKMB, CKMBINDEX, TROPONINI in  the last 72 hours. Invalid input(s): POCBNP No results for input(s): DDIMER in the last 72 hours. No results for input(s): HGBA1C in the last 72 hours. No results for input(s): CHOL, HDL, LDLCALC, TRIG, CHOLHDL, LDLDIRECT in the last 72 hours. No results for input(s): TSH, T4TOTAL, T3FREE, THYROIDAB in the last 72 hours.  Invalid input(s): FREET3 No results for input(s): VITAMINB12, FOLATE, FERRITIN, TIBC, IRON, RETICCTPCT in the last 72 hours. Coags: No results for input(s): INR in the last 72 hours.  Invalid input(s): PT Microbiology: Recent Results (from the past 240 hour(s))  CULTURE, URINE COMPREHENSIVE     Status: Abnormal   Collection Time: 08/08/17  4:22 PM  Result Value Ref Range Status   MICRO NUMBER: [id number]  Final   SPECIMEN QUALITY: ADEQUATE  Final   Source URINE  Final   STATUS: FINAL  Final   ISOLATE 1: Lactobacillus species (A)  Final    FURTHER DISCHARGE INSTRUCTIONS:  Get Medicines reviewed and adjusted: Please take all your medications with you for your next visit with your Primary MD  Laboratory/radiological data: Please request your Primary MD to go over all hospital tests and procedure/radiological results at the follow up, please ask your Primary MD to get all Hospital records sent to his/her office.  In some cases, they will be blood work, cultures and biopsy results pending at the time of your discharge. Please request that your primary care M.D. goes through all the records of your hospital data and follows up on these results.  Also Note the following: If you experience worsening of your admission symptoms, develop shortness of breath, life threatening emergency, suicidal or homicidal thoughts you must seek medical attention immediately by calling 911 or calling your MD immediately  if symptoms less severe.  You must read complete instructions/literature along with all the possible adverse reactions/side effects  for all the Medicines you take and that have been prescribed to you. Take any new Medicines after you have completely understood and accpet all the possible adverse reactions/side effects.   Do not drive when taking Pain medications or sleeping medications (Benzodaizepines)  Do not take more than prescribed Pain, Sleep and Anxiety Medications. It is not advisable to combine anxiety,sleep and pain medications without talking with your primary care practitioner  Special Instructions: If you have smoked or chewed Tobacco  in the last 2 yrs please stop smoking, stop any regular Alcohol  and or any Recreational drug use.  Wear Seat belts while driving.  Please note: You were cared for by a hospitalist during your hospital stay. Once you are discharged, your primary care physician will handle any further medical issues. Please note that NO REFILLS for any discharge medications will be authorized once you are discharged, as it is imperative that you return to your primary care physician (or establish a relationship with a primary care physician if you do not have one) for your post hospital discharge needs so that they can reassess your need for medications and monitor your lab values.  Total Time spent coordinating discharge including counseling, education and face to face time equals45 minutes.  SignedOren Binet 08/15/2017 7:49 AM

## 2017-08-15 NOTE — Care Management Important Message (Signed)
Important Message  Patient Details  Name: [name] MRN: [id number] Date of Birth: 05-04-1941   Medicare Important Message Given:  Yes    Kerin Salen 08/15/2017, 11:20 [place] Message  Patient Details  Name: [name] MRN: [id number] Date of Birth: January 19, 1941   Medicare Important Message Given:  Yes    Kerin Salen 08/15/2017, 11:20 AM

## 2017-08-15 NOTE — Care Management Note (Signed)
Case Management Note  Patient Details  Name: [name] MRN: [id number] Date of Birth: 05-Sep-1940  Subjective/Objective:  [facility] rep aware of d/c & HHC orders. Patient already has home 02, & travel tank. Family has own transp home. No further CM needs.                  Action/Plan:d/c home w/HHC.   Expected Discharge Date:  08/15/17               Expected Discharge Plan:  [place]  In-House Referral:  Clinical Social Work  Discharge planning Services  CM Consult  Post Acute Care Choice:  Durable Medical Equipment(Active w/[facility] home 02-has travel tank) Choice offered to:  Patient  DME Arranged:    DME Agency:     HH Arranged:  RN, PT, OT, Social Work [employer] Agency:  [place]  Status of Service:  Completed, signed off  If discussed at [employer], dates discussed:    Additional Comments:  Dessa Phi, RN 08/15/2017, 11:58 AM

## 2017-08-15 NOTE — Progress Notes (Signed)
Physical Therapy Treatment Patient Details Name: [name] MRN: [id number] DOB: 04/26/1941 Today's Date: 08/15/2017    History of Present Illness 77 yo female admitted with pulmonary edema, CHF exacerbation. Hx of COPD-O2 dep, HF, gout, chronic LE swelling, HTN, CKD, venous stasis.     PT Comments    Much improved mobility and decreased r LE pain this session.  Pt was able to get OOB to amb to bathroom all at Supervision.  Able to stand and perform self peri care, wash hands at sink then amb a greater distance in hallway using RW and remaining on 2 lts nasal (chronic O2).  Pt declines SNF rec and plans to D/C to home with spouse.    Follow Up Recommendations  SNF(pt declines SNF and plans to go home)     Equipment Recommendations  None recommended by PT    Recommendations for Other Services       Precautions / Restrictions Precautions Precautions: Fall Precaution Comments: O2 dep Restrictions Weight Bearing Restrictions: No    Mobility  Bed Mobility Overal bed mobility: Needs Assistance Bed Mobility: Supine to Sit     Supine to sit: Supervision     General bed mobility comments: increased time and use of rail.  Pt stated she plans to sleep in her recliner.   Transfers Overall transfer level: Needs assistance Equipment used: Rolling walker (2 wheeled) Transfers: Sit to/from [facility] Sit to Stand: Supervision Stand pivot transfers: Supervision       General transfer comment: performed all at supervision level.  Much improved  Ambulation/Gait Ambulation/Gait assistance: Supervision Ambulation Distance (Feet): 65 Feet Assistive device: Rolling walker (2 wheeled) Gait Pattern/deviations: Step-to pattern;Step-through pattern Gait velocity: WFL   General Gait Details: pt tolerated an increased distance.  Increased ability to WB through R LE only mild r ankle pain.  Remained on 2 lts nasal (chronic O2)   Good safety cognition.      Stairs            Wheelchair Mobility    Modified Rankin (Stroke Patients Only)       Balance                                            Cognition Arousal/Alertness: Awake/alert Behavior During Therapy: WFL for tasks assessed/performed Overall Cognitive Status: Within Functional Limits for tasks assessed                                 General Comments: feeling much better      Exercises      General Comments        Pertinent Vitals/Pain Pain Assessment: Faces Faces Pain Scale: Hurts a little bit Pain Location: Right ankle "better" Pain Descriptors / Indicators: Constant Pain Intervention(s): Monitored during session;Repositioned;Ice applied    Home Living                      Prior Function            PT Goals (current goals can now be found in the care plan section) Progress towards PT goals: Progressing toward goals    Frequency    Min 3X/week      PT Plan Current plan remains appropriate    Co-evaluation  AM-PAC PT "6 Clicks" Daily Activity  Outcome Measure  Difficulty turning over in bed (including adjusting bedclothes, sheets and blankets)?: A Little Difficulty moving from lying on back to sitting on the side of the bed? : A Little   Help needed moving to and from a bed to chair (including a wheelchair)?: A Little   Help needed climbing 3-5 steps with a railing? : A Little 6 Click Score: 12    End of Session Equipment Utilized During Treatment: Gait belt;Oxygen Activity Tolerance: Patient tolerated treatment well Patient left: with call bell/phone within reach;in chair;with chair alarm set Nurse Communication: Mobility status(much improved) PT Visit Diagnosis: Muscle weakness (generalized) (M62.81);Difficulty in walking, not elsewhere classified (R26.2);Pain Pain - Right/Left: Right Pain - part of body: Ankle and joints of foot     Time: 1040-1103 PT Time  Calculation (min) (ACUTE ONLY): 23 min  Charges:  $Gait Training: 8-22 mins $Therapeutic Activity: 8-22 mins                    G Codes:       Rica Koyanagi  PTA [facility]  Acute  Rehab Pager      [phone]

## 2017-08-16 ENCOUNTER — Telehealth: Payer: Self-pay | Admitting: Internal Medicine

## 2017-08-16 ENCOUNTER — Telehealth: Payer: Self-pay

## 2017-08-16 DIAGNOSIS — E785 Hyperlipidemia, unspecified: Secondary | ICD-10-CM | POA: Diagnosis not present

## 2017-08-16 DIAGNOSIS — Z7951 Long term (current) use of inhaled steroids: Secondary | ICD-10-CM | POA: Diagnosis not present

## 2017-08-16 DIAGNOSIS — I5032 Chronic diastolic (congestive) heart failure: Secondary | ICD-10-CM | POA: Diagnosis not present

## 2017-08-16 DIAGNOSIS — Z9981 Dependence on supplemental oxygen: Secondary | ICD-10-CM | POA: Diagnosis not present

## 2017-08-16 DIAGNOSIS — J449 Chronic obstructive pulmonary disease, unspecified: Secondary | ICD-10-CM | POA: Diagnosis not present

## 2017-08-16 DIAGNOSIS — D500 Iron deficiency anemia secondary to blood loss (chronic): Secondary | ICD-10-CM | POA: Diagnosis not present

## 2017-08-16 DIAGNOSIS — I129 Hypertensive chronic kidney disease with stage 1 through stage 4 chronic kidney disease, or unspecified chronic kidney disease: Secondary | ICD-10-CM | POA: Diagnosis not present

## 2017-08-16 DIAGNOSIS — K219 Gastro-esophageal reflux disease without esophagitis: Secondary | ICD-10-CM | POA: Diagnosis not present

## 2017-08-16 DIAGNOSIS — N183 Chronic kidney disease, stage 3 (moderate): Secondary | ICD-10-CM | POA: Diagnosis not present

## 2017-08-16 DIAGNOSIS — I8310 Varicose veins of unspecified lower extremity with inflammation: Secondary | ICD-10-CM | POA: Diagnosis not present

## 2017-08-16 DIAGNOSIS — K5190 Ulcerative colitis, unspecified, without complications: Secondary | ICD-10-CM | POA: Diagnosis not present

## 2017-08-16 NOTE — Telephone Encounter (Signed)
Transition Care Management Follow-up Telephone Call   Date discharged? 08/15/2017   How have you been since you were released from the hospital? Pt stated that she is feeling better.   Do you understand why you were in the hospital? Pt stated understanding.   Do you understand the discharge instructions? Yes. Pt did have medication questions.   Where were you discharged to? Home. Pt refused SNF.   Items Reviewed:  Medications reviewed: Yes. Explained the torsemide 20 mg instructions.   Allergies reviewed: Yes  Dietary changes reviewed: Yes  Referrals reviewed: Yes.   Functional Questionnaire:   Activities of Daily Living (ADLs):   States they are independent in the following: Independent in all ADL's States they require assistance with the following: n/a  Any transportation issues/concerns?: None at this time.   Any patient concerns? Not at this time.   Confirmed importance and date/time of follow-up visits scheduled: YES Provider Appointment booked with Dr. Ronnald Ramp on Monday 08/19/2017  Confirmed with patient if condition begins to worsen call PCP or go to the ER.  Patient was given the office number and encouraged to call back with question or concerns: Yes

## 2017-08-16 NOTE — Telephone Encounter (Signed)
Despina Hidden with [facility] verbal okay for SW and SN as requested.

## 2017-08-16 NOTE — Telephone Encounter (Signed)
Copied from [place] [id number]. Topic: General - Other >> Aug 16, 2017 10:09 AM Darl Householder, RMA wrote: Reason for CRM: Verdis Frederickson from [facility] is requesting verbal orders for nursing and medical social work once a week x1 week and twice a week x2 weeks, please call maria at [phone]

## 2017-08-18 ENCOUNTER — Other Ambulatory Visit: Payer: Self-pay | Admitting: Internal Medicine

## 2017-08-19 ENCOUNTER — Ambulatory Visit: Payer: Medicare Other | Admitting: Internal Medicine

## 2017-08-19 ENCOUNTER — Other Ambulatory Visit (INDEPENDENT_AMBULATORY_CARE_PROVIDER_SITE_OTHER): Payer: Medicare Other

## 2017-08-19 ENCOUNTER — Encounter: Payer: Self-pay | Admitting: Internal Medicine

## 2017-08-19 ENCOUNTER — Telehealth: Payer: Self-pay

## 2017-08-19 VITALS — BP 160/60 | HR 68 | Temp 97.5°F | Resp 16 | Ht 60.0 in | Wt 202.8 lb

## 2017-08-19 DIAGNOSIS — N183 Chronic kidney disease, stage 3 unspecified: Secondary | ICD-10-CM

## 2017-08-19 DIAGNOSIS — D500 Iron deficiency anemia secondary to blood loss (chronic): Secondary | ICD-10-CM

## 2017-08-19 DIAGNOSIS — I10 Essential (primary) hypertension: Secondary | ICD-10-CM

## 2017-08-19 DIAGNOSIS — R7303 Prediabetes: Secondary | ICD-10-CM

## 2017-08-19 DIAGNOSIS — B3731 Acute candidiasis of vulva and vagina: Secondary | ICD-10-CM

## 2017-08-19 DIAGNOSIS — B373 Candidiasis of vulva and vagina: Secondary | ICD-10-CM | POA: Diagnosis not present

## 2017-08-19 DIAGNOSIS — I5033 Acute on chronic diastolic (congestive) heart failure: Secondary | ICD-10-CM

## 2017-08-19 LAB — CBC WITH DIFFERENTIAL/PLATELET
BASOS PCT: 0.2 % (ref 0.0–3.0)
Basophils Absolute: 0 10*3/uL (ref 0.0–0.1)
Eosinophils Absolute: 0 10*3/uL (ref 0.0–0.7)
Eosinophils Relative: 0.2 % (ref 0.0–5.0)
HCT: 39.4 % (ref 36.0–46.0)
Hemoglobin: 12.6 g/dL (ref 12.0–15.0)
LYMPHS ABS: 1.1 10*3/uL (ref 0.7–4.0)
Lymphocytes Relative: 6 % — ABNORMAL LOW (ref 12.0–46.0)
MCHC: 32.1 g/dL (ref 30.0–36.0)
MCV: 77.9 fl — ABNORMAL LOW (ref 78.0–100.0)
MONO ABS: 0.8 10*3/uL (ref 0.1–1.0)
Monocytes Relative: 4.5 % (ref 3.0–12.0)
NEUTROS ABS: 16.4 10*3/uL — AB (ref 1.4–7.7)
PLATELETS: 398 10*3/uL (ref 150.0–400.0)
RBC: 5.05 Mil/uL (ref 3.87–5.11)
RDW: 24.9 % — AB (ref 11.5–15.5)
WBC: 18.4 10*3/uL (ref 4.0–10.5)

## 2017-08-19 LAB — BASIC METABOLIC PANEL
BUN: 73 mg/dL — ABNORMAL HIGH (ref 6–23)
CALCIUM: 10.2 mg/dL (ref 8.4–10.5)
CO2: 35 mEq/L — ABNORMAL HIGH (ref 19–32)
Chloride: 92 mEq/L — ABNORMAL LOW (ref 96–112)
Creatinine, Ser: 1.37 mg/dL — ABNORMAL HIGH (ref 0.40–1.20)
GFR: 39.73 mL/min — AB (ref >60.00)
Glucose, Bld: 158 mg/dL — ABNORMAL HIGH (ref 70–99)
Potassium: 4 mEq/L (ref 3.5–5.1)
SODIUM: 135 meq/L (ref 135–145)

## 2017-08-19 LAB — HEMOGLOBIN A1C: HEMOGLOBIN A1C: 5.7 % (ref 4.6–6.5)

## 2017-08-19 NOTE — Patient Instructions (Signed)
Iron Deficiency Anemia, Adult Iron deficiency anemia is a condition in which the concentration of red blood cells or hemoglobin in the blood is below normal because of too little iron. Hemoglobin is a substance in red blood cells that carries oxygen to the body's tissues. When the concentration of red blood cells or hemoglobin is too low, not enough oxygen reaches these tissues. Iron deficiency anemia is usually long-lasting (chronic) and it develops over time. It may or may not cause symptoms. It is a common type of anemia. What are the causes? This condition may be caused by:  Not enough iron in the diet.  Blood loss caused by bleeding in the intestine.  Blood loss from a gastrointestinal condition like Crohn disease.  Frequent blood draws, such as from blood donation.  Abnormal absorption in the gut.  Heavy menstrual periods in women.  Cancers of the gastrointestinal system, such as colon cancer.  What are the signs or symptoms? Symptoms of this condition may include:  Fatigue.  Headache.  Pale skin, lips, and nail beds.  Poor appetite.  Weakness.  Shortness of breath.  Dizziness.  Cold hands and feet.  Fast or irregular heartbeat.  Irritability. This is more common in severe anemia.  Rapid breathing. This is more common in severe anemia.  Mild anemia may not cause any symptoms. How is this diagnosed? This condition is diagnosed based on:  Your medical history.  A physical exam.  Blood tests.  You may have additional tests to find the underlying cause of your anemia, such as:  Testing for blood in the stool (fecal occult blood test).  A procedure to see inside your colon and rectum (colonoscopy).  A procedure to see inside your esophagus and stomach (endoscopy).  A test in which cells are removed from bone marrow (bone marrow aspiration) or fluid is removed from the bone marrow to be examined (biopsy). This is rarely needed.  How is this  treated? This condition is treated by correcting the cause of your iron deficiency. Treatment may involve:  Adding iron-rich foods to your diet.  Taking iron supplements. If you are pregnant or breastfeeding, you may need to take extra iron because your normal diet usually does not provide the amount of iron that you need.  Increasing vitamin C intake. Vitamin C helps your body absorb iron. Your health care provider may recommend that you take iron supplements along with a glass of orange juice or a vitamin C supplement.  Medicines to make heavy menstrual flow lighter.  Surgery.  You may need repeat blood tests to determine whether treatment is working. Depending on the underlying cause, the anemia should be corrected within 2 months of starting treatment. If the treatment does not seem to be working, you may need more testing. Follow these instructions at home: Medicines  Take over-the-counter and prescription medicines only as told by your health care provider. This includes iron supplements and vitamins.  If you cannot tolerate taking iron supplements by mouth, talk with your health care provider about taking them through a vein (intravenously) or an injection into a muscle.  For the best iron absorption, you should take iron supplements when your stomach is empty. If you cannot tolerate them on an empty stomach, you may need to take them with food.  Do not drink milk or take antacids at the same time as your iron supplements. Milk and antacids may interfere with iron absorption.  Iron supplements can cause constipation. To prevent constipation, include fiber   in your diet as told by your health care provider. A stool softener may also be recommended. Eating and drinking  Talk with your health care provider before changing your diet. He or she may recommend that you eat foods that contain a lot of iron, such as: ? Liver. ? Low-fat (lean) beef. ? Breads and cereals that have iron  added to them (are fortified). ? Eggs. ? Dried fruit. ? Dark green, leafy vegetables.  To help your body use the iron from iron-rich foods, eat those foods at the same time as fresh fruits and vegetables that are high in vitamin C. Foods that are high in vitamin C include: ? Oranges. ? Peppers. ? Tomatoes. ? Mangoes.  Drinkenoughfluid to keep your urine clear or pale yellow. General instructions  Return to your normal activities as told by your health care provider. Ask your health care provider what activities are safe for you.  Practice good hygiene. Anemia can make you more prone to illness and infection.  Keep all follow-up visits as told by your health care provider. This is important. Contact a health care provider if:  You feel nauseous or you vomit.  You feel weak.  You have unexplained sweating.  You develop symptoms of constipation, such as: ? Having fewer than three bowel movements a week. ? Straining to have a bowel movement. ? Having stools that are hard, dry, or larger than normal. ? Feeling full or bloated. ? Pain in the lower abdomen. ? Not feeling relief after having a bowel movement. Get help right away if:  You faint. If this happens, do not drive yourself to the hospital. Call your local emergency services (911 in the [place]  You have chest pain.  You have shortness of breath that: ? Is severe. ? Gets worse with physical activity.  You have a rapid heartbeat.  You become light-headed when getting up from a sitting or lying down position. This information is not intended to replace advice given to you by your health care provider. Make sure you discuss any questions you have with your health care provider. Document Released: 07/13/2000 Document Revised: 04/04/2016 Document Reviewed: 04/04/2016 Elsevier Interactive Patient Education  2018 Elsevier Inc.  

## 2017-08-19 NOTE — Telephone Encounter (Signed)
Received critical value from lab of WBC of 18.4.

## 2017-08-19 NOTE — Progress Notes (Signed)
Subjective:  Patient ID: [name], female    DOB: March 05, 1941  Age: 77 y.o. MRN: [id number]  CC: Anemia and Hypertension   HPI [name] presents for hosp f/up- she was recently admitted for symptomatic anemia and CHF exacerbation.  It is presumed that the anemia is caused by leaking AVMs in the small intestines.  The decision during the admission by GI was not to do any endoscopies.  While she was admitted she received an iron infusion and a blood transfusion.  She feels much better but still complains of mild weakness, SOB, and lightheadedness.  She is on continuous O2.  Brief Hospital Course: Acute on chronic diastolic heart failure: Managed with IV diuretic regimen-however hospital course complicated by acute kidney injury.  Diuretics were held-briefly hydrated.  Diuretics were restarted yesterday, weight down to 205 pounds from a peak of 209 pounds on admission.  Would avoid metolazone and ACE inhibitor for now, and will continue with torsemide 40 mg daily that was started on 1/16.  Follow volume status and weights closely in the outpatient setting.  Follow electrolytes-and adjust diuretic regimen as needed.    Acute kidney injury disease stage ACZ:YSAYTK hemodynamically mediated-improving with supportive care.   Diuretics have been restarted-continue to follow closely in the outpatient setting.  Severe anemia due to chronic GI bleeding:Status post 2 units of PRBC-hemoglobin currently stable. She does not give a history of melena/hematochezia overt GI bleeding. GI consulted-recommendations are for supportive care, per GI-endoscopic evaluation only indicated when patient develops overt GI bleeding.  Continue iron supplementation.  Probable gouty arthritis:Did complain of bilateral foot pain-responding to steroids-we will taper in the outpatient setting in a matter of a few days.  Hypertension:Controlled continue BiDil and metoprolol.  Would avoid ACE inhibitor or ARB  at this time.  COPD with chronic hypoxemic respiratory failure: Lungs are clear-stable-continue bronchodilators  Ulcerative colitis:Stable-continue outpatient GI follow-up  Morbid obesity: Counseled  Acute on chronic debility: Seen by PT eval-recommendations of SNF-however patient refuses and wants to go home with home health services.     Outpatient Medications Prior to Visit  Medication Sig Dispense Refill  . acetaminophen (TYLENOL) 325 MG tablet Take 650 mg by mouth every 6 (six) hours as needed for moderate pain, fever or headache.    . albuterol (PROAIR HFA) 108 (90 Base) MCG/ACT inhaler Inhale 1-2 puffs into the lungs every 6 (six) hours as needed for wheezing or shortness of breath. 18 g 11  . albuterol (PROVENTIL) (2.5 MG/3ML) 0.083% nebulizer solution Take 3 mLs (2.5 mg total) by nebulization every 6 (six) hours as needed for wheezing or shortness of breath. 150 mL 11  . allopurinol (ZYLOPRIM) 100 MG tablet TAKE 1 TABLET BY MOUTH EVERY DAY FOR GOUT 90 tablet 0  . cyanocobalamin 2000 MCG tablet Take 1 tablet (2,000 mcg total) by mouth daily. (Patient taking differently: Take 2,000 mcg by mouth every evening. ) 90 tablet 3  . Dietary Management Product (VASCULERA) TABS Take 1 tablet by mouth daily. 90 tablet 1  . ferrous sulfate 325 (65 FE) MG tablet Take 1 tablet (325 mg total) by mouth 2 (two) times daily with a meal. 60 tablet 11  . fluticasone furoate-vilanterol (BREO ELLIPTA) 100-25 MCG/INH AEPB Inhale 1 puff into the lungs daily. 60 each 11  . hydrocerin (EUCERIN) CREA Apply 1 application topically as directed. 454 g 0  . isosorbide-hydrALAZINE (BIDIL) 20-37.5 MG tablet Take 1 tablet by mouth 3 (three) times daily. 90 tablet 0  .  metoprolol tartrate (LOPRESSOR) 50 MG tablet Take 0.5 tablets (25 mg total) by mouth 2 (two) times daily. 30 tablet 0  . predniSONE (DELTASONE) 10 MG tablet Take 4 tablets (40 mg) daily for 1 day, then, Take 3 tablets (30 mg) daily for 1 day,  then, Take 2 tablets (20 mg) daily for 1 day, then, Take 1 tablets (10 mg) daily for 1 day, then stop 10 tablet 0  . promethazine-dextromethorphan (PROMETHAZINE-DM) 6.25-15 MG/5ML syrup Take 5 mLs by mouth 4 (four) times daily as needed for cough.  0  . torsemide (DEMADEX) 20 MG tablet Take 2 tablets (40 mg total) by mouth daily. 60 tablet 0  . Vitamin D, Ergocalciferol, (DRISDOL) 50000 units CAPS capsule TAKE ONE CAPSULE BY MOUTH ONCE WEEKLY 12 capsule 1   No facility-administered medications prior to visit.     ROS Review of Systems  Constitutional: Negative for diaphoresis, fatigue and unexpected weight change.  HENT: Negative.   Eyes: Negative for visual disturbance.  Respiratory: Positive for shortness of breath. Negative for cough, chest tightness and wheezing.   Cardiovascular: Negative.  Negative for chest pain, palpitations and leg swelling.  Gastrointestinal: Negative for abdominal pain, constipation, diarrhea, nausea and vomiting.  Endocrine: Negative.   Genitourinary: Positive for vaginal discharge. Negative for decreased urine volume, difficulty urinating, dysuria, flank pain, frequency, pelvic pain, urgency, vaginal bleeding and vaginal pain.       She complains of vaginal irritation and thinks she may have a yeast infection.  Musculoskeletal: Negative.  Negative for arthralgias, myalgias and neck pain.  Skin: Negative.   Allergic/Immunologic: Negative.   Neurological: Positive for dizziness, weakness and light-headedness. Negative for headaches.  Hematological: Negative for adenopathy. Does not bruise/bleed easily.  Psychiatric/Behavioral: Negative.     Objective:  BP (!) 160/60 (BP Location: Left Arm, Patient Position: Sitting, Cuff Size: Large)   Pulse 68   Temp (!) 97.5 F (36.4 C) (Oral)   Resp 16   Ht 5' (1.524 m)   Wt 202 lb 12 oz (92 kg)   SpO2 97%   BMI 39.60 kg/m   BP Readings from Last 3 Encounters:  08/19/17 (!) 160/60  08/15/17 (!) 148/36    08/08/17 (!) 180/50    Wt Readings from Last 3 Encounters:  08/19/17 202 lb 12 oz (92 kg)  08/15/17 205 lb 0.4 oz (93 kg)  08/08/17 216 lb (98 kg)    Physical Exam  Constitutional: She is oriented to person, place, and time.  Non-toxic appearance. She does not have a sickly appearance. She does not appear ill. No distress.  On O2  HENT:  Mouth/Throat: Oropharynx is clear and moist. No oropharyngeal exudate.  Eyes: Conjunctivae are normal. Left eye exhibits no discharge. No scleral icterus.  Neck: Normal range of motion. Neck supple. No JVD present. No thyromegaly present.  Cardiovascular: Normal rate and regular rhythm. Exam reveals no gallop, no S3, no S4 and no friction rub.  Murmur heard.  Systolic murmur is present with a grade of 1/6.  No diastolic murmur is present. Pulmonary/Chest: Effort normal and breath sounds normal. No respiratory distress. She has no wheezes. She has no rales.  Abdominal: Bowel sounds are normal. She exhibits no distension and no mass. There is no tenderness.  Musculoskeletal: Normal range of motion. She exhibits edema (trace pitting edema in BLE). She exhibits no tenderness or deformity.  Lymphadenopathy:    She has no cervical adenopathy.  Neurological: She is alert and oriented to person, place, and time.  Skin: Skin is warm and dry. No rash noted. She is not diaphoretic. No erythema. No pallor.    Lab Results  Component Value Date   WBC 18.4 Repeated and verified X2. (HH) 08/19/2017   HGB 12.6 08/19/2017   HCT 39.4 08/19/2017   PLT 398.0 08/19/2017   GLUCOSE 158 (H) 08/19/2017   CHOL 175 09/04/2016   TRIG 215.0 (H) 09/04/2016   HDL 46.10 09/04/2016   LDLDIRECT 107.0 09/04/2016   LDLCALC 93 08/17/2015   ALT 11 (L) 08/08/2017   AST 17 08/08/2017   NA 135 08/19/2017   K 4.0 08/19/2017   CL 92 (L) 08/19/2017   CREATININE 1.37 (H) 08/19/2017   BUN 73 (H) 08/19/2017   CO2 35 (H) 08/19/2017   TSH 3.22 08/08/2017   INR 1.07 08/08/2017    HGBA1C 5.7 08/19/2017   MICROALBUR 15.9 (H) 02/05/2013    Dg Chest 2 View  Result Date: 08/08/2017 CLINICAL DATA:  Shortness of breath and low hemoglobin. EXAM: CHEST  2 VIEW COMPARISON:  07/18/2017 FINDINGS: Lungs are adequately inflated demonstrate mild hazy prominence of the perihilar and bibasilar regions without significant change likely mild vascular congestion. Small bilateral pleural effusions unchanged to slightly worse. Mild stable cardiomegaly. Remainder of the exam is unchanged. IMPRESSION: Findings suggesting mild CHF with slightly worse small bilateral pleural effusions. Electronically Signed   By: Marin Olp M.D.   On: 08/08/2017 19:42   Dg Chest Port 1 View  Result Date: 08/09/2017 CLINICAL DATA:  77 year old female with CHF. EXAM: PORTABLE CHEST 1 VIEW COMPARISON:  Chest radiograph dated 08/08/2017. FINDINGS: Small bilateral effusions with associated atelectatic changes of the lung bases, grossly similar to prior radiograph. Pneumonia is not excluded. Clinical correlation is recommended. No pneumothorax. Stable mild cardiomegaly. Atherosclerotic calcification of the aorta. No acute osseous pathology. IMPRESSION: No significant change in the bilateral pleural effusions are atelectatic changes of the lung bases. Clinical correlation and follow-up recommended. Electronically Signed   By: Anner Crete M.D.   On: 08/09/2017 05:33    Assessment & Plan:   Yamileth was seen today for anemia and hypertension.  Diagnoses and all orders for this visit:  Essential hypertension- Her blood pressure is adequately well controlled for now.  Her renal function is slightly improved and her electrolytes are normal. -     Basic metabolic panel; Future  CKD (chronic kidney disease), stage III ([place])- She is prerenal as a result of the loop diuretic.  This is required to maintain her fluid status.  Creatinine clearance is slightly improved.  She agrees to avoid nephrotoxic agents. -     Basic  metabolic panel; Future  Iron deficiency anemia due to chronic blood loss- Her H&H are normal now.  Her white cell count is elevated consistent with steroid therapy.  She agrees to be compliant with the iron replacement therapy. -     CBC with Differential/Platelet; Future  Prediabetes- Her A1c is at 5.7%.  Medical therapy is not indicated. -     Hemoglobin A1c; Future  Yeast vaginitis -     fluconazole (DIFLUCAN) 150 MG tablet; Take 1 tablet (150 mg total) by mouth once for 1 dose.   I am having [name] start on fluconazole. I am also having her maintain her cyanocobalamin, ferrous sulfate, albuterol, hydrocerin, VASCULERA, fluticasone furoate-vilanterol, albuterol, acetaminophen, promethazine-dextromethorphan, metoprolol tartrate, torsemide, isosorbide-hydrALAZINE, predniSONE, Vitamin D (Ergocalciferol), and allopurinol.  Meds ordered this encounter  Medications  . fluconazole (DIFLUCAN) 150 MG tablet  Sig: Take 1 tablet (150 mg total) by mouth once for 1 dose.    Dispense:  1 tablet    Refill:  3     Follow-up: Return in about 3 months (around 11/17/2017).  [name], MD

## 2017-08-20 ENCOUNTER — Telehealth: Payer: Self-pay | Admitting: Internal Medicine

## 2017-08-20 ENCOUNTER — Encounter: Payer: Self-pay | Admitting: Internal Medicine

## 2017-08-20 DIAGNOSIS — J449 Chronic obstructive pulmonary disease, unspecified: Secondary | ICD-10-CM | POA: Diagnosis not present

## 2017-08-20 DIAGNOSIS — I5032 Chronic diastolic (congestive) heart failure: Secondary | ICD-10-CM | POA: Diagnosis not present

## 2017-08-20 DIAGNOSIS — N183 Chronic kidney disease, stage 3 (moderate): Secondary | ICD-10-CM | POA: Diagnosis not present

## 2017-08-20 DIAGNOSIS — Z7951 Long term (current) use of inhaled steroids: Secondary | ICD-10-CM | POA: Diagnosis not present

## 2017-08-20 DIAGNOSIS — K5190 Ulcerative colitis, unspecified, without complications: Secondary | ICD-10-CM | POA: Diagnosis not present

## 2017-08-20 DIAGNOSIS — I8310 Varicose veins of unspecified lower extremity with inflammation: Secondary | ICD-10-CM | POA: Diagnosis not present

## 2017-08-20 DIAGNOSIS — D500 Iron deficiency anemia secondary to blood loss (chronic): Secondary | ICD-10-CM | POA: Diagnosis not present

## 2017-08-20 DIAGNOSIS — E785 Hyperlipidemia, unspecified: Secondary | ICD-10-CM | POA: Diagnosis not present

## 2017-08-20 DIAGNOSIS — B3731 Acute candidiasis of vulva and vagina: Secondary | ICD-10-CM | POA: Insufficient documentation

## 2017-08-20 DIAGNOSIS — K219 Gastro-esophageal reflux disease without esophagitis: Secondary | ICD-10-CM | POA: Diagnosis not present

## 2017-08-20 DIAGNOSIS — Z9981 Dependence on supplemental oxygen: Secondary | ICD-10-CM | POA: Diagnosis not present

## 2017-08-20 DIAGNOSIS — B373 Candidiasis of vulva and vagina: Secondary | ICD-10-CM | POA: Insufficient documentation

## 2017-08-20 DIAGNOSIS — I129 Hypertensive chronic kidney disease with stage 1 through stage 4 chronic kidney disease, or unspecified chronic kidney disease: Secondary | ICD-10-CM | POA: Diagnosis not present

## 2017-08-20 MED ORDER — FLUCONAZOLE 150 MG PO TABS: 150.0000 mg | ORAL_TABLET | Freq: Once | ORAL | 3 refills | Status: AC

## 2017-08-20 NOTE — Telephone Encounter (Signed)
Notified stacy w/MD response...Johny Chess

## 2017-08-20 NOTE — Telephone Encounter (Signed)
Ok with me 

## 2017-08-20 NOTE — Telephone Encounter (Signed)
Copied from [place]. Topic: Quick Communication - See Telephone Encounter >> Aug 20, 2017  8:55 AM Ether Griffins B wrote: CRM for notification. See Telephone encounter for:  Pt was seen in office 08/19/17 and was told something would be called in for a yeast infection and it hasnt been called in yet.   [id number] - [place], [place] - [address] AT [facility] [address] 08/20/17.

## 2017-08-20 NOTE — Telephone Encounter (Signed)
Copied from [place]. Topic: General - Other >> Aug 20, 2017 10:37 AM Conception Chancy, NT wrote: Marzetta Board, PT with [facility] is doing an evalution today 08/20/17 and would like verbal orders to do physcial therapy 2x a week for 3weeks.  Marzetta Board [phone]

## 2017-08-22 ENCOUNTER — Telehealth: Payer: Self-pay | Admitting: Internal Medicine

## 2017-08-22 ENCOUNTER — Ambulatory Visit: Payer: Self-pay | Admitting: *Deleted

## 2017-08-22 ENCOUNTER — Encounter: Payer: Self-pay | Admitting: *Deleted

## 2017-08-22 DIAGNOSIS — J449 Chronic obstructive pulmonary disease, unspecified: Secondary | ICD-10-CM | POA: Diagnosis not present

## 2017-08-22 DIAGNOSIS — E785 Hyperlipidemia, unspecified: Secondary | ICD-10-CM | POA: Diagnosis not present

## 2017-08-22 DIAGNOSIS — N183 Chronic kidney disease, stage 3 (moderate): Secondary | ICD-10-CM | POA: Diagnosis not present

## 2017-08-22 DIAGNOSIS — Z9981 Dependence on supplemental oxygen: Secondary | ICD-10-CM | POA: Diagnosis not present

## 2017-08-22 DIAGNOSIS — R300 Dysuria: Secondary | ICD-10-CM

## 2017-08-22 DIAGNOSIS — I129 Hypertensive chronic kidney disease with stage 1 through stage 4 chronic kidney disease, or unspecified chronic kidney disease: Secondary | ICD-10-CM | POA: Diagnosis not present

## 2017-08-22 DIAGNOSIS — K5190 Ulcerative colitis, unspecified, without complications: Secondary | ICD-10-CM | POA: Diagnosis not present

## 2017-08-22 DIAGNOSIS — I5032 Chronic diastolic (congestive) heart failure: Secondary | ICD-10-CM | POA: Diagnosis not present

## 2017-08-22 DIAGNOSIS — Z7951 Long term (current) use of inhaled steroids: Secondary | ICD-10-CM | POA: Diagnosis not present

## 2017-08-22 DIAGNOSIS — D500 Iron deficiency anemia secondary to blood loss (chronic): Secondary | ICD-10-CM | POA: Diagnosis not present

## 2017-08-22 DIAGNOSIS — K219 Gastro-esophageal reflux disease without esophagitis: Secondary | ICD-10-CM | POA: Diagnosis not present

## 2017-08-22 DIAGNOSIS — I8310 Varicose veins of unspecified lower extremity with inflammation: Secondary | ICD-10-CM | POA: Diagnosis not present

## 2017-08-22 NOTE — Telephone Encounter (Signed)
This encounter was created in error - please disregard.

## 2017-08-22 NOTE — Telephone Encounter (Signed)
Returned call back to patient to clarify her symptoms of UTI. No answer, left message for her to call back.

## 2017-08-22 NOTE — Telephone Encounter (Signed)
yes

## 2017-08-22 NOTE — Telephone Encounter (Signed)
Pt has been having right flank pain over the last 7 weeks. The pain comes and goes. Also she has been having a low grade fever. Wants to know if her urine can be check for a possible uti.  She has a home health nurse that has been coming to her house since she was discharged from [facility] recently.  Home care advice given to patient with verbal understanding.  Reason for Disposition . [1] MILD pain (i.e., scale 1-3; does not interfere with normal activities) AND [2] present > 3 days  Answer Assessment - Initial Assessment Questions 1. LOCATION: "Where does it hurt?" (e.g., left, right)     Right side was on both sides 2. ONSET: "When did the pain start?"     Started about 7 weeks ago off and on 3. SEVERITY: "How bad is the pain?" (e.g., Scale 1-10; mild, moderate, or severe)   - MILD (1-3): doesn't interfere with normal activities    - MODERATE (4-7): interferes with normal activities or awakens from sleep    - SEVERE (8-10): excruciating pain and patient unable to do normal activities (stays in bed)       No pain right now 4. PATTERN: "Does the pain come and go, or is it constant?"      Comes and goes 5. CAUSE: "What do you think is causing the pain?"     Not sure, feels like a kidney infection 6. OTHER SYMPTOMS:  "Do you have any other symptoms?" (e.g., fever, abdominal pain, vomiting, leg weakness, burning with urination, blood in urine)     Low grade fever at times,lower abd pain, has yeast infection right now 7. PREGNANCY:  "Is there any chance you are pregnant?" "When was your last menstrual period?"     n/a  Protocols used: FLANK PAIN-A-AH

## 2017-08-22 NOTE — Telephone Encounter (Signed)
Pt is requesting UA with culture to be ordered. Okay to place order?

## 2017-08-23 ENCOUNTER — Other Ambulatory Visit (INDEPENDENT_AMBULATORY_CARE_PROVIDER_SITE_OTHER): Payer: Medicare Other

## 2017-08-23 DIAGNOSIS — R300 Dysuria: Secondary | ICD-10-CM | POA: Diagnosis not present

## 2017-08-23 LAB — URINALYSIS, ROUTINE W REFLEX MICROSCOPIC
Bilirubin Urine: NEGATIVE
HGB URINE DIPSTICK: NEGATIVE
Ketones, ur: NEGATIVE
Nitrite: NEGATIVE
PH: 6.5 (ref 5.0–8.0)
RBC / HPF: NONE SEEN (ref 0–?)
TOTAL PROTEIN, URINE-UPE24: NEGATIVE
Urine Glucose: NEGATIVE
Urobilinogen, UA: 0.2 (ref 0.0–1.0)

## 2017-08-23 NOTE — Addendum Note (Signed)
Addended by: Karle Barr on: 08/23/2017 08:44 AM   Modules accepted: Orders

## 2017-08-23 NOTE — Telephone Encounter (Signed)
Order entered. Pt informed.

## 2017-08-24 DIAGNOSIS — J449 Chronic obstructive pulmonary disease, unspecified: Secondary | ICD-10-CM | POA: Diagnosis not present

## 2017-08-25 LAB — CULTURE, URINE COMPREHENSIVE
MICRO NUMBER:: 90108609
RESULT:: NO GROWTH
SPECIMEN QUALITY: ADEQUATE

## 2017-08-26 DIAGNOSIS — Z9981 Dependence on supplemental oxygen: Secondary | ICD-10-CM | POA: Diagnosis not present

## 2017-08-26 DIAGNOSIS — Z7951 Long term (current) use of inhaled steroids: Secondary | ICD-10-CM | POA: Diagnosis not present

## 2017-08-26 DIAGNOSIS — I8310 Varicose veins of unspecified lower extremity with inflammation: Secondary | ICD-10-CM | POA: Diagnosis not present

## 2017-08-26 DIAGNOSIS — I129 Hypertensive chronic kidney disease with stage 1 through stage 4 chronic kidney disease, or unspecified chronic kidney disease: Secondary | ICD-10-CM | POA: Diagnosis not present

## 2017-08-26 DIAGNOSIS — N183 Chronic kidney disease, stage 3 (moderate): Secondary | ICD-10-CM | POA: Diagnosis not present

## 2017-08-26 DIAGNOSIS — K5190 Ulcerative colitis, unspecified, without complications: Secondary | ICD-10-CM | POA: Diagnosis not present

## 2017-08-26 DIAGNOSIS — E785 Hyperlipidemia, unspecified: Secondary | ICD-10-CM | POA: Diagnosis not present

## 2017-08-26 DIAGNOSIS — J449 Chronic obstructive pulmonary disease, unspecified: Secondary | ICD-10-CM | POA: Diagnosis not present

## 2017-08-26 DIAGNOSIS — D500 Iron deficiency anemia secondary to blood loss (chronic): Secondary | ICD-10-CM | POA: Diagnosis not present

## 2017-08-26 DIAGNOSIS — K219 Gastro-esophageal reflux disease without esophagitis: Secondary | ICD-10-CM | POA: Diagnosis not present

## 2017-08-26 DIAGNOSIS — I5032 Chronic diastolic (congestive) heart failure: Secondary | ICD-10-CM | POA: Diagnosis not present

## 2017-08-27 DIAGNOSIS — Z9981 Dependence on supplemental oxygen: Secondary | ICD-10-CM | POA: Diagnosis not present

## 2017-08-27 DIAGNOSIS — J449 Chronic obstructive pulmonary disease, unspecified: Secondary | ICD-10-CM | POA: Diagnosis not present

## 2017-08-27 DIAGNOSIS — N183 Chronic kidney disease, stage 3 (moderate): Secondary | ICD-10-CM | POA: Diagnosis not present

## 2017-08-27 DIAGNOSIS — I8310 Varicose veins of unspecified lower extremity with inflammation: Secondary | ICD-10-CM | POA: Diagnosis not present

## 2017-08-27 DIAGNOSIS — D500 Iron deficiency anemia secondary to blood loss (chronic): Secondary | ICD-10-CM | POA: Diagnosis not present

## 2017-08-27 DIAGNOSIS — I129 Hypertensive chronic kidney disease with stage 1 through stage 4 chronic kidney disease, or unspecified chronic kidney disease: Secondary | ICD-10-CM | POA: Diagnosis not present

## 2017-08-27 DIAGNOSIS — E785 Hyperlipidemia, unspecified: Secondary | ICD-10-CM | POA: Diagnosis not present

## 2017-08-27 DIAGNOSIS — Z7951 Long term (current) use of inhaled steroids: Secondary | ICD-10-CM | POA: Diagnosis not present

## 2017-08-27 DIAGNOSIS — K5190 Ulcerative colitis, unspecified, without complications: Secondary | ICD-10-CM | POA: Diagnosis not present

## 2017-08-27 DIAGNOSIS — K219 Gastro-esophageal reflux disease without esophagitis: Secondary | ICD-10-CM | POA: Diagnosis not present

## 2017-08-27 DIAGNOSIS — I5032 Chronic diastolic (congestive) heart failure: Secondary | ICD-10-CM | POA: Diagnosis not present

## 2017-08-28 ENCOUNTER — Telehealth: Payer: Self-pay | Admitting: Internal Medicine

## 2017-08-28 NOTE — Telephone Encounter (Signed)
Left detailed message with Urine results and with verbal orders.

## 2017-08-28 NOTE — Telephone Encounter (Signed)
Copied from [place] [id number]. Topic: Quick Communication - See Telephone Encounter >> Aug 28, 2017  9:40 AM Bea Graff, NT wrote: CRM for notification. See Telephone encounter for: Bet from [facility] calling to get results from pts urine test she had done last week. Also to get verbal orders to continue home care for 2 more weeks. CB#: [phone]  08/28/17.

## 2017-08-29 DIAGNOSIS — K5190 Ulcerative colitis, unspecified, without complications: Secondary | ICD-10-CM | POA: Diagnosis not present

## 2017-08-29 DIAGNOSIS — D500 Iron deficiency anemia secondary to blood loss (chronic): Secondary | ICD-10-CM | POA: Diagnosis not present

## 2017-08-29 DIAGNOSIS — I8310 Varicose veins of unspecified lower extremity with inflammation: Secondary | ICD-10-CM | POA: Diagnosis not present

## 2017-08-29 DIAGNOSIS — K219 Gastro-esophageal reflux disease without esophagitis: Secondary | ICD-10-CM | POA: Diagnosis not present

## 2017-08-29 DIAGNOSIS — N183 Chronic kidney disease, stage 3 (moderate): Secondary | ICD-10-CM | POA: Diagnosis not present

## 2017-08-29 DIAGNOSIS — I129 Hypertensive chronic kidney disease with stage 1 through stage 4 chronic kidney disease, or unspecified chronic kidney disease: Secondary | ICD-10-CM | POA: Diagnosis not present

## 2017-08-29 DIAGNOSIS — I5032 Chronic diastolic (congestive) heart failure: Secondary | ICD-10-CM | POA: Diagnosis not present

## 2017-08-29 DIAGNOSIS — Z9981 Dependence on supplemental oxygen: Secondary | ICD-10-CM | POA: Diagnosis not present

## 2017-08-29 DIAGNOSIS — J449 Chronic obstructive pulmonary disease, unspecified: Secondary | ICD-10-CM | POA: Diagnosis not present

## 2017-08-29 DIAGNOSIS — Z7951 Long term (current) use of inhaled steroids: Secondary | ICD-10-CM | POA: Diagnosis not present

## 2017-08-29 DIAGNOSIS — E785 Hyperlipidemia, unspecified: Secondary | ICD-10-CM | POA: Diagnosis not present

## 2017-09-03 DIAGNOSIS — E785 Hyperlipidemia, unspecified: Secondary | ICD-10-CM | POA: Diagnosis not present

## 2017-09-03 DIAGNOSIS — I129 Hypertensive chronic kidney disease with stage 1 through stage 4 chronic kidney disease, or unspecified chronic kidney disease: Secondary | ICD-10-CM | POA: Diagnosis not present

## 2017-09-03 DIAGNOSIS — I8310 Varicose veins of unspecified lower extremity with inflammation: Secondary | ICD-10-CM | POA: Diagnosis not present

## 2017-09-03 DIAGNOSIS — N183 Chronic kidney disease, stage 3 (moderate): Secondary | ICD-10-CM | POA: Diagnosis not present

## 2017-09-03 DIAGNOSIS — D500 Iron deficiency anemia secondary to blood loss (chronic): Secondary | ICD-10-CM | POA: Diagnosis not present

## 2017-09-03 DIAGNOSIS — J449 Chronic obstructive pulmonary disease, unspecified: Secondary | ICD-10-CM | POA: Diagnosis not present

## 2017-09-03 DIAGNOSIS — Z9981 Dependence on supplemental oxygen: Secondary | ICD-10-CM | POA: Diagnosis not present

## 2017-09-03 DIAGNOSIS — Z7951 Long term (current) use of inhaled steroids: Secondary | ICD-10-CM | POA: Diagnosis not present

## 2017-09-03 DIAGNOSIS — K5190 Ulcerative colitis, unspecified, without complications: Secondary | ICD-10-CM | POA: Diagnosis not present

## 2017-09-03 DIAGNOSIS — I5032 Chronic diastolic (congestive) heart failure: Secondary | ICD-10-CM | POA: Diagnosis not present

## 2017-09-03 DIAGNOSIS — K219 Gastro-esophageal reflux disease without esophagitis: Secondary | ICD-10-CM | POA: Diagnosis not present

## 2017-09-04 ENCOUNTER — Encounter: Payer: Self-pay | Admitting: Interventional Cardiology

## 2017-09-05 DIAGNOSIS — N183 Chronic kidney disease, stage 3 (moderate): Secondary | ICD-10-CM | POA: Diagnosis not present

## 2017-09-05 DIAGNOSIS — I129 Hypertensive chronic kidney disease with stage 1 through stage 4 chronic kidney disease, or unspecified chronic kidney disease: Secondary | ICD-10-CM | POA: Diagnosis not present

## 2017-09-05 DIAGNOSIS — Z7951 Long term (current) use of inhaled steroids: Secondary | ICD-10-CM | POA: Diagnosis not present

## 2017-09-05 DIAGNOSIS — J449 Chronic obstructive pulmonary disease, unspecified: Secondary | ICD-10-CM | POA: Diagnosis not present

## 2017-09-05 DIAGNOSIS — I8310 Varicose veins of unspecified lower extremity with inflammation: Secondary | ICD-10-CM | POA: Diagnosis not present

## 2017-09-05 DIAGNOSIS — K219 Gastro-esophageal reflux disease without esophagitis: Secondary | ICD-10-CM | POA: Diagnosis not present

## 2017-09-05 DIAGNOSIS — K5190 Ulcerative colitis, unspecified, without complications: Secondary | ICD-10-CM | POA: Diagnosis not present

## 2017-09-05 DIAGNOSIS — D500 Iron deficiency anemia secondary to blood loss (chronic): Secondary | ICD-10-CM | POA: Diagnosis not present

## 2017-09-05 DIAGNOSIS — E785 Hyperlipidemia, unspecified: Secondary | ICD-10-CM | POA: Diagnosis not present

## 2017-09-05 DIAGNOSIS — I5032 Chronic diastolic (congestive) heart failure: Secondary | ICD-10-CM | POA: Diagnosis not present

## 2017-09-05 DIAGNOSIS — Z9981 Dependence on supplemental oxygen: Secondary | ICD-10-CM | POA: Diagnosis not present

## 2017-09-10 ENCOUNTER — Encounter (INDEPENDENT_AMBULATORY_CARE_PROVIDER_SITE_OTHER): Payer: Self-pay

## 2017-09-10 ENCOUNTER — Ambulatory Visit: Payer: Medicare Other | Admitting: Interventional Cardiology

## 2017-09-10 ENCOUNTER — Encounter: Payer: Self-pay | Admitting: Interventional Cardiology

## 2017-09-10 VITALS — BP 148/58 | HR 76 | Ht 60.0 in | Wt 204.8 lb

## 2017-09-10 DIAGNOSIS — Z8774 Personal history of (corrected) congenital malformations of heart and circulatory system: Secondary | ICD-10-CM | POA: Diagnosis not present

## 2017-09-10 DIAGNOSIS — R600 Localized edema: Secondary | ICD-10-CM | POA: Diagnosis not present

## 2017-09-10 DIAGNOSIS — I10 Essential (primary) hypertension: Secondary | ICD-10-CM

## 2017-09-10 DIAGNOSIS — I5032 Chronic diastolic (congestive) heart failure: Secondary | ICD-10-CM | POA: Diagnosis not present

## 2017-09-10 MED ORDER — METOPROLOL TARTRATE 50 MG PO TABS: 50.0000 mg | ORAL_TABLET | Freq: Two times a day (BID) | ORAL | 3 refills | Status: DC

## 2017-09-10 NOTE — Patient Instructions (Signed)
Medication Instructions:  Your physician has recommended you make the following change in your medication:   INCREASE: metoprolol 50 mg twice a day  Labwork: None ordered  Testing/Procedures: None ordered  Follow-Up: Your physician recommends that you schedule a follow-up appointment in: 3-4 months with Dr. Irish Lack or an APP on his team    Any Other Special Instructions Will Be Listed Below (If Applicable).     If you need a refill on your cardiac medications before your next appointment, please call your pharmacy.

## 2017-09-10 NOTE — Progress Notes (Signed)
Cardiology Office Note   Date:  09/10/2017   ID:  [name], [name] 1941/02/09, MRN [id number]  PCP:  [name], MD    No chief complaint on file.  Chronic diastolic heart failure  Wt Readings from Last 3 Encounters:  09/10/17 204 lb 12.8 oz (92.9 kg)  08/19/17 202 lb 12 oz (92 kg)  08/15/17 205 lb 0.4 oz (93 kg)       History of Present Illness: [name] is a 77 y.o. female  With a h/o chronic diastolic heart failure.  We are asked to evaluate by Dr. Ronnald Ramp.   Hospital records report: "chronic GI bleeding secondary to AVMs-presented to the hospital for evaluation of low hemoglobin, and decompensated diastolic heart failure. She was transfused PRBC 2 units, and started on IV diuretics. Hospital course has been complicated by development of acute kidney injury.   Acute on chronic diastolic heart failure: Managed with IV diuretic regimen-however hospital course complicated by acute kidney injury.  Diuretics were held-briefly hydrated.  Diuretics were restarted yesterday, weight down to 205 pounds from a peak of 209 pounds on admission.  Would avoid metolazone and ACE inhibitor for now, and will continue with torsemide 40 mg daily that was started on 1/16.  Follow volume status and weights closely in the outpatient setting.  Follow electrolytes-and adjust diuretic regimen as needed.    Acute kidney injury disease stage GXQ:JJHERD hemodynamically mediated-improving with supportive care.   Diuretics have been restarted-continue to follow closely in the outpatient setting.  Severe anemia due to chronic GI bleeding:Status post 2 units of PRBC-hemoglobin currently stable. She does not give a history of melena/hematochezia overt GI bleeding. GI consulted-recommendations are for supportive care, per GI-endoscopic evaluation only indicated when patient develops overt GI bleeding.  Continue iron supplementation."  She was transfused.  Echo was done showing diastolic  heart failure.  AKI resolved.   Overall, she has improved since leaving the hospital.  She still feels unsteady on her feet.  She wants to drive again.    Denies : Exertional Chest pain. Dizziness. Leg edema. Nitroglycerin use. Orthopnea. Palpitations. Paroxysmal nocturnal dyspnea. Shortness of breath. Syncope.   She has had some flashes of pain in her stomach that last a few seconds.  At home, she wears oxygen.    She takes care of her home and this is the most strenuous activity she does.  She lives with her daughter who is disabled from a stroke.         Past Medical History:  Diagnosis Date  . CHF (congestive heart failure) ([place]) 08/31/03  . Colonic polyp 02/16/2008   Tubular adenoma  . COPD (chronic obstructive pulmonary disease) ([place]) 01/27/98  . GERD (gastroesophageal reflux disease) 07/30/92  . Gout   . HTN (hypertension) 07/30/1988  . Hyperlipidemia 11/27/96  . Kidney stone 1960, 1972, 1991  . Renal insufficiency   . Ulcerative colitis     Past Surgical History:  Procedure Laterality Date  . BRONCHOSCOPY    . COLONOSCOPY WITH PROPOFOL N/A 02/23/2015   Procedure: COLONOSCOPY WITH PROPOFOL;  Surgeon: Ladene Artist, MD;  Location: [facility];  Service: Endoscopy;  Laterality: N/A;  . ESOPHAGOGASTRODUODENOSCOPY (EGD) WITH PROPOFOL N/A 02/23/2015   Procedure: ESOPHAGOGASTRODUODENOSCOPY (EGD) WITH PROPOFOL;  Surgeon: Ladene Artist, MD;  Location: [facility];  Service: Endoscopy;  Laterality: N/A;  . NO PAST SURGERIES       Current Outpatient Medications  Medication Sig Dispense Refill  . acetaminophen (  TYLENOL) 325 MG tablet Take 650 mg by mouth every 6 (six) hours as needed for moderate pain, fever or headache.    . albuterol (PROAIR HFA) 108 (90 Base) MCG/ACT inhaler Inhale 1-2 puffs into the lungs every 6 (six) hours as needed for wheezing or shortness of breath. 18 g 11  . albuterol (PROVENTIL) (2.5 MG/3ML) 0.083% nebulizer solution Take 3 mLs (2.5 mg total) by  nebulization every 6 (six) hours as needed for wheezing or shortness of breath. 150 mL 11  . allopurinol (ZYLOPRIM) 100 MG tablet TAKE 1 TABLET BY MOUTH EVERY DAY FOR GOUT 90 tablet 0  . cyanocobalamin 2000 MCG tablet Take 1 tablet (2,000 mcg total) by mouth daily. (Patient taking differently: Take 2,000 mcg by mouth every evening. ) 90 tablet 3  . Dietary Management Product (VASCULERA) TABS Take 1 tablet by mouth daily. 90 tablet 1  . ferrous sulfate 325 (65 FE) MG tablet Take 1 tablet (325 mg total) by mouth 2 (two) times daily with a meal. 60 tablet 11  . fluticasone furoate-vilanterol (BREO ELLIPTA) 100-25 MCG/INH AEPB Inhale 1 puff into the lungs daily. 60 each 11  . hydrocerin (EUCERIN) CREA Apply 1 application topically as directed. 454 g 0  . isosorbide-hydrALAZINE (BIDIL) 20-37.5 MG tablet Take 1 tablet by mouth 3 (three) times daily. 90 tablet 0  . metoprolol tartrate (LOPRESSOR) 50 MG tablet Take 0.5 tablets (25 mg total) by mouth 2 (two) times daily. 30 tablet 0  . promethazine-dextromethorphan (PROMETHAZINE-DM) 6.25-15 MG/5ML syrup Take 5 mLs by mouth 4 (four) times daily as needed for cough.  0  . torsemide (DEMADEX) 20 MG tablet Take 2 tablets (40 mg total) by mouth daily. 60 tablet 0  . Vitamin D, Ergocalciferol, (DRISDOL) 50000 units CAPS capsule TAKE ONE CAPSULE BY MOUTH ONCE WEEKLY 12 capsule 1   No current facility-administered medications for this visit.     Allergies:   Celebrex [celecoxib]; Enalapril; Lipitor [atorvastatin]; Amlodipine besylate; Amoxicillin; Codeine sulfate; and Hydrocodone-acetaminophen    Social History:  The patient  reports that she quit smoking about 21 years ago. she has never used smokeless tobacco. She reports that she does not drink alcohol or use drugs.   Family History:  The patient's family history includes Cervical cancer in her sister; Clotting disorder in her sister; Crohn's disease in her brother; Diabetes in her mother and sister; Heart  attack in her sister; Heart disease in her brother, brother, mother, and sister; Kidney cancer in her mother; Lung cancer in her sister; Stomach cancer in her mother; Stroke in her father and mother.    ROS:  Please see the history of present illness.   Otherwise, review of systems are positive for DOE.   All other systems are reviewed and negative.    PHYSICAL EXAM: VS:  BP (!) 148/58   Pulse 76   Ht 5' (1.524 m)   Wt 204 lb 12.8 oz (92.9 kg)   SpO2 95%   BMI 40.00 kg/m  , BMI Body mass index is 40 kg/m. GEN: Well nourished, well developed, in no acute distress  HEENT: normal  Neck: no JVD, carotid bruits, or masses Cardiac: RRR; 2/6 systolic murmur, ; norubs, or gallops,no edema  Respiratory:  clear to auscultation bilaterally, normal work of breathing GI: soft, nontender, nondistended, + BS MS: no deformity or atrophy  Skin: warm and dry, no rash Neuro:  Strength and sensation are intact Psych: euthymic mood, full affect   EKG:   The ekg  ordered Aug 08, 2017 demonstrates NSR, LVH with strain   Recent Labs: 08/08/2017: ALT 11; B Natriuretic Peptide 268.3; Pro B Natriuretic peptide (BNP) 304.0; TSH 3.22 08/12/2017: Magnesium 2.1 08/19/2017: BUN 73; Creatinine, Ser 1.37; Hemoglobin 12.6; Platelets 398.0; Potassium 4.0; Sodium 135   Lipid Panel    Component Value Date/Time   CHOL 175 09/04/2016 1553   TRIG 215.0 (H) 09/04/2016 1553   HDL 46.10 09/04/2016 1553   CHOLHDL 4 09/04/2016 1553   VLDL 43.0 (H) 09/04/2016 1553   LDLCALC 93 08/17/2015 1436   LDLDIRECT 107.0 09/04/2016 1553     Other studies Reviewed: Additional studies/ records that were reviewed today with results demonstrating: echo from Jan 2019 reviewed: Normal LV function.   ASSESSMENT AND PLAN:  1. Chronic diastolic heart failure:  Episode was likely related to low Hbg.  COntinue diuretics, salt restirction.  Increase metoprolol to 25 mg BID.  Negative troponin and no typical angina.  WOuld not plan  ischemic w/u at this time, especially given severe GI bleeding issues.  2. GI bleed: AVMs.  WOuld avoid aspirin at this time.  Watch for signs of bleeding.    3. Bilateral leg edema:  Elevate legs.  Compression stockings may be helpful.  Continue diuretics.   4. HTN: Monitor for elevated readings.    Current medicines are reviewed at length with the patient today.  The patient concerns regarding her medicines were addressed.  The following changes have been made:  No change  Labs/ tests ordered today include:  No orders of the defined types were placed in this encounter.   Recommend 150 minutes/week of aerobic exercise Low fat, low carb, high fiber diet recommended  Disposition:   FU in 3-4 months   Signed, Larae Grooms, MD  09/10/2017 3:56 PM    [facility] [place], [place], [place]  [address] Phone: [phone]; Fax: [phone]

## 2017-09-11 DIAGNOSIS — D500 Iron deficiency anemia secondary to blood loss (chronic): Secondary | ICD-10-CM | POA: Diagnosis not present

## 2017-09-11 DIAGNOSIS — I8310 Varicose veins of unspecified lower extremity with inflammation: Secondary | ICD-10-CM | POA: Diagnosis not present

## 2017-09-11 DIAGNOSIS — Z9981 Dependence on supplemental oxygen: Secondary | ICD-10-CM | POA: Diagnosis not present

## 2017-09-11 DIAGNOSIS — N183 Chronic kidney disease, stage 3 (moderate): Secondary | ICD-10-CM | POA: Diagnosis not present

## 2017-09-11 DIAGNOSIS — I129 Hypertensive chronic kidney disease with stage 1 through stage 4 chronic kidney disease, or unspecified chronic kidney disease: Secondary | ICD-10-CM | POA: Diagnosis not present

## 2017-09-11 DIAGNOSIS — E785 Hyperlipidemia, unspecified: Secondary | ICD-10-CM | POA: Diagnosis not present

## 2017-09-11 DIAGNOSIS — K219 Gastro-esophageal reflux disease without esophagitis: Secondary | ICD-10-CM | POA: Diagnosis not present

## 2017-09-11 DIAGNOSIS — I5032 Chronic diastolic (congestive) heart failure: Secondary | ICD-10-CM | POA: Diagnosis not present

## 2017-09-11 DIAGNOSIS — K5190 Ulcerative colitis, unspecified, without complications: Secondary | ICD-10-CM | POA: Diagnosis not present

## 2017-09-11 DIAGNOSIS — Z7951 Long term (current) use of inhaled steroids: Secondary | ICD-10-CM | POA: Diagnosis not present

## 2017-09-11 DIAGNOSIS — J449 Chronic obstructive pulmonary disease, unspecified: Secondary | ICD-10-CM | POA: Diagnosis not present

## 2017-09-19 ENCOUNTER — Ambulatory Visit: Payer: Medicare Other | Admitting: Internal Medicine

## 2017-09-19 ENCOUNTER — Encounter: Payer: Self-pay | Admitting: Internal Medicine

## 2017-09-19 VITALS — BP 150/78 | HR 55 | Temp 97.9°F | Ht 60.0 in | Wt 204.2 lb

## 2017-09-19 DIAGNOSIS — B028 Zoster with other complications: Secondary | ICD-10-CM | POA: Diagnosis not present

## 2017-09-19 DIAGNOSIS — I509 Heart failure, unspecified: Secondary | ICD-10-CM | POA: Diagnosis not present

## 2017-09-19 DIAGNOSIS — I5042 Chronic combined systolic (congestive) and diastolic (congestive) heart failure: Secondary | ICD-10-CM | POA: Diagnosis not present

## 2017-09-19 DIAGNOSIS — R0602 Shortness of breath: Secondary | ICD-10-CM | POA: Diagnosis not present

## 2017-09-19 DIAGNOSIS — I4891 Unspecified atrial fibrillation: Secondary | ICD-10-CM | POA: Diagnosis not present

## 2017-09-19 DIAGNOSIS — B029 Zoster without complications: Secondary | ICD-10-CM | POA: Insufficient documentation

## 2017-09-19 DIAGNOSIS — I110 Hypertensive heart disease with heart failure: Secondary | ICD-10-CM | POA: Diagnosis not present

## 2017-09-19 MED ORDER — PREDNISONE 5 MG PO TABS: 15.0000 mg | ORAL_TABLET | Freq: Two times a day (BID) | ORAL | 0 refills | Status: AC

## 2017-09-19 MED ORDER — ISOSORB DINITRATE-HYDRALAZINE 20-37.5 MG PO TABS: 1.0000 | ORAL_TABLET | Freq: Three times a day (TID) | ORAL | 1 refills | Status: DC

## 2017-09-19 MED ORDER — PREDNISONE 2.5 MG PO TABS: 7.5000 mg | ORAL_TABLET | Freq: Two times a day (BID) | ORAL | 0 refills | Status: AC

## 2017-09-19 MED ORDER — PROMETHAZINE HCL 12.5 MG PO TABS: 12.5000 mg | ORAL_TABLET | Freq: Four times a day (QID) | ORAL | 0 refills | Status: DC | PRN

## 2017-09-19 MED ORDER — OXYCODONE-ACETAMINOPHEN 7.5-325 MG PO TABS
1.0000 | ORAL_TABLET | ORAL | 0 refills | Status: DC | PRN
Start: 2017-09-19 — End: 2017-10-17

## 2017-09-19 MED ORDER — VALACYCLOVIR HCL 1 G PO TABS: 1000.0000 mg | ORAL_TABLET | Freq: Two times a day (BID) | ORAL | 0 refills | Status: AC

## 2017-09-19 MED ORDER — PREDNISONE 10 MG PO TABS: 30.0000 mg | ORAL_TABLET | Freq: Two times a day (BID) | ORAL | 0 refills | Status: AC

## 2017-09-19 NOTE — Progress Notes (Signed)
Subjective:  Patient ID: [name], female    DOB: 03-17-1941  Age: 77 y.o. MRN: [id number]  CC: Rash   HPI [name] presents for a 2-day history of painful, red, blistery rash that extends from her upper abdomen into the left flank.  Outpatient Medications Prior to Visit  Medication Sig Dispense Refill  . acetaminophen (TYLENOL) 325 MG tablet Take 650 mg by mouth every 6 (six) hours as needed for moderate pain, fever or headache.    . albuterol (PROAIR HFA) 108 (90 Base) MCG/ACT inhaler Inhale 1-2 puffs into the lungs every 6 (six) hours as needed for wheezing or shortness of breath. 18 g 11  . albuterol (PROVENTIL) (2.5 MG/3ML) 0.083% nebulizer solution Take 3 mLs (2.5 mg total) by nebulization every 6 (six) hours as needed for wheezing or shortness of breath. 150 mL 11  . allopurinol (ZYLOPRIM) 100 MG tablet TAKE 1 TABLET BY MOUTH EVERY DAY FOR GOUT 90 tablet 0  . cyanocobalamin 2000 MCG tablet Take 1 tablet (2,000 mcg total) by mouth daily. (Patient taking differently: Take 2,000 mcg by mouth every evening. ) 90 tablet 3  . Dietary Management Product (VASCULERA) TABS Take 1 tablet by mouth daily. 90 tablet 1  . ferrous sulfate 325 (65 FE) MG tablet Take 1 tablet (325 mg total) by mouth 2 (two) times daily with a meal. 60 tablet 11  . fluticasone furoate-vilanterol (BREO ELLIPTA) 100-25 MCG/INH AEPB Inhale 1 puff into the lungs daily. 60 each 11  . hydrocerin (EUCERIN) CREA Apply 1 application topically as directed. 454 g 0  . metoprolol tartrate (LOPRESSOR) 50 MG tablet Take 1 tablet (50 mg total) by mouth 2 (two) times daily. 180 tablet 3  . promethazine-dextromethorphan (PROMETHAZINE-DM) 6.25-15 MG/5ML syrup Take 5 mLs by mouth 4 (four) times daily as needed for cough.  0  . torsemide (DEMADEX) 20 MG tablet Take 2 tablets (40 mg total) by mouth daily. 60 tablet 0  . Vitamin D, Ergocalciferol, (DRISDOL) 50000 units CAPS capsule TAKE ONE CAPSULE BY MOUTH ONCE WEEKLY 12  capsule 1  . isosorbide-hydrALAZINE (BIDIL) 20-37.5 MG tablet Take 1 tablet by mouth 3 (three) times daily. 90 tablet 0   No facility-administered medications prior to visit.     ROS Review of Systems  Constitutional: Negative for chills, diaphoresis, fatigue and fever.  HENT: Negative.  Negative for sore throat.   Eyes: Negative for visual disturbance.  Respiratory: Positive for shortness of breath. Negative for cough, chest tightness and wheezing.   Cardiovascular: Negative for chest pain, palpitations and leg swelling.  Gastrointestinal: Negative for abdominal pain, constipation, nausea and vomiting.  Endocrine: Negative.   Genitourinary: Negative.  Negative for difficulty urinating and urgency.  Musculoskeletal: Negative.  Negative for back pain and myalgias.  Skin: Positive for rash.  Allergic/Immunologic: Negative.   Neurological: Negative.  Negative for dizziness, weakness and light-headedness.  Hematological: Negative for adenopathy. Does not bruise/bleed easily.  Psychiatric/Behavioral: Negative.     Objective:  BP (!) 150/78 (BP Location: Left Arm, Patient Position: Sitting, Cuff Size: Large)   Pulse (!) 55   Temp 97.9 F (36.6 C) (Oral)   Ht 5' (1.524 m)   Wt 204 lb 3 oz (92.6 kg)   SpO2 97%   BMI 39.88 kg/m   BP Readings from Last 3 Encounters:  09/19/17 (!) 150/78  09/10/17 (!) 148/58  08/19/17 (!) 160/60    Wt Readings from Last 3 Encounters:  09/19/17 204 lb 3 oz (92.6 kg)  09/10/17 204 lb 12.8 oz (92.9 kg)  08/19/17 202 lb 12 oz (92 kg)    Physical Exam  Constitutional: She is oriented to person, place, and time. No distress.  HENT:  Mouth/Throat: Oropharynx is clear and moist. No oropharyngeal exudate.  Eyes: Conjunctivae are normal. Left eye exhibits no discharge. No scleral icterus.  Neck: Normal range of motion. Neck supple. No JVD present. No thyromegaly present.  Cardiovascular: Normal rate, regular rhythm and normal heart sounds. Exam  reveals no gallop.  No murmur heard. Pulmonary/Chest: Effort normal and breath sounds normal. No respiratory distress. She has no wheezes. She has no rales.  Abdominal: Soft. Bowel sounds are normal. She exhibits no distension and no mass. There is no tenderness. There is no guarding.    Musculoskeletal: Normal range of motion. She exhibits no edema, tenderness or deformity.  Lymphadenopathy:    She has no cervical adenopathy.  Neurological: She is alert and oriented to person, place, and time.  Skin: Skin is warm and dry. Rash noted. She is not diaphoretic. There is erythema. No pallor.  Vitals reviewed.   Lab Results  Component Value Date   WBC 18.4 Repeated and verified X2. (HH) 08/19/2017   HGB 12.6 08/19/2017   HCT 39.4 08/19/2017   PLT 398.0 08/19/2017   GLUCOSE 158 (H) 08/19/2017   CHOL 175 09/04/2016   TRIG 215.0 (H) 09/04/2016   HDL 46.10 09/04/2016   LDLDIRECT 107.0 09/04/2016   LDLCALC 93 08/17/2015   ALT 11 (L) 08/08/2017   AST 17 08/08/2017   NA 135 08/19/2017   K 4.0 08/19/2017   CL 92 (L) 08/19/2017   CREATININE 1.37 (H) 08/19/2017   BUN 73 (H) 08/19/2017   CO2 35 (H) 08/19/2017   TSH 3.22 08/08/2017   INR 1.07 08/08/2017   HGBA1C 5.7 08/19/2017   MICROALBUR 15.9 (H) 02/05/2013    Dg Chest 2 View  Result Date: 08/08/2017 CLINICAL DATA:  Shortness of breath and low hemoglobin. EXAM: CHEST  2 VIEW COMPARISON:  07/18/2017 FINDINGS: Lungs are adequately inflated demonstrate mild hazy prominence of the perihilar and bibasilar regions without significant change likely mild vascular congestion. Small bilateral pleural effusions unchanged to slightly worse. Mild stable cardiomegaly. Remainder of the exam is unchanged. IMPRESSION: Findings suggesting mild CHF with slightly worse small bilateral pleural effusions. Electronically Signed   By: Marin Olp M.D.   On: 08/08/2017 19:42   Dg Chest Port 1 View  Result Date: 08/09/2017 CLINICAL DATA:  77 year old female  with CHF. EXAM: PORTABLE CHEST 1 VIEW COMPARISON:  Chest radiograph dated 08/08/2017. FINDINGS: Small bilateral effusions with associated atelectatic changes of the lung bases, grossly similar to prior radiograph. Pneumonia is not excluded. Clinical correlation is recommended. No pneumothorax. Stable mild cardiomegaly. Atherosclerotic calcification of the aorta. No acute osseous pathology. IMPRESSION: No significant change in the bilateral pleural effusions are atelectatic changes of the lung bases. Clinical correlation and follow-up recommended. Electronically Signed   By: Anner Crete M.D.   On: 08/09/2017 05:33    Assessment & Plan:   Alegria was seen today for rash.  Diagnoses and all orders for this visit:  Herpes zoster with other complication- I will start treating the infection with valacyclovir at a renally adjusted dose.  Will try to decrease the acute pain with a course of prednisone.  Will also offer Percocet and Phenergan as needed for pain and the possibility of nausea or vomiting. -     valACYclovir (VALTREX) 1000 MG tablet; Take 1  tablet (1,000 mg total) by mouth 2 (two) times daily for 7 days. -     promethazine (PHENERGAN) 12.5 MG tablet; Take 1 tablet (12.5 mg total) by mouth every 6 (six) hours as needed for nausea or vomiting. -     oxyCODONE-acetaminophen (PERCOCET) 7.5-325 MG tablet; Take 1 tablet by mouth every 4 (four) hours as needed. -     predniSONE (DELTASONE) 10 MG tablet; Take 3 tablets (30 mg total) by mouth 2 (two) times daily with a meal for 7 days. -     predniSONE (DELTASONE) 2.5 MG tablet; Take 3 tablets (7.5 mg total) by mouth 2 (two) times daily with a meal for 7 days. -     predniSONE (DELTASONE) 5 MG tablet; Take 3 tablets (15 mg total) by mouth 2 (two) times daily with a meal for 7 days.  CHF (congestive heart failure), NYHA class III, chronic, combined ([place])- Her volume status is normal.  Will continue the current combination of hydralazine and  isosorbide. -     isosorbide-hydrALAZINE (BIDIL) 20-37.5 MG tablet; Take 1 tablet by mouth 3 (three) times daily.   I am having [name] start on valACYclovir, promethazine, oxyCODONE-acetaminophen, predniSONE, predniSONE, and predniSONE. I am also having her maintain her cyanocobalamin, ferrous sulfate, albuterol, hydrocerin, VASCULERA, fluticasone furoate-vilanterol, albuterol, acetaminophen, promethazine-dextromethorphan, torsemide, Vitamin D (Ergocalciferol), allopurinol, metoprolol tartrate, and isosorbide-hydrALAZINE.  Meds ordered this encounter  Medications  . valACYclovir (VALTREX) 1000 MG tablet    Sig: Take 1 tablet (1,000 mg total) by mouth 2 (two) times daily for 7 days.    Dispense:  14 tablet    Refill:  0  . promethazine (PHENERGAN) 12.5 MG tablet    Sig: Take 1 tablet (12.5 mg total) by mouth every 6 (six) hours as needed for nausea or vomiting.    Dispense:  30 tablet    Refill:  0  . oxyCODONE-acetaminophen (PERCOCET) 7.5-325 MG tablet    Sig: Take 1 tablet by mouth every 4 (four) hours as needed.    Dispense:  65 tablet    Refill:  0  . predniSONE (DELTASONE) 10 MG tablet    Sig: Take 3 tablets (30 mg total) by mouth 2 (two) times daily with a meal for 7 days.    Dispense:  42 tablet    Refill:  0  . predniSONE (DELTASONE) 2.5 MG tablet    Sig: Take 3 tablets (7.5 mg total) by mouth 2 (two) times daily with a meal for 7 days.    Dispense:  42 tablet    Refill:  0  . predniSONE (DELTASONE) 5 MG tablet    Sig: Take 3 tablets (15 mg total) by mouth 2 (two) times daily with a meal for 7 days.    Dispense:  42 tablet    Refill:  0  . isosorbide-hydrALAZINE (BIDIL) 20-37.5 MG tablet    Sig: Take 1 tablet by mouth 3 (three) times daily.    Dispense:  270 tablet    Refill:  1     Follow-up: Return in about 3 weeks (around 10/10/2017).  [name], MD

## 2017-09-19 NOTE — Patient Instructions (Signed)
Shingles Shingles, which is also known as herpes zoster, is an infection that causes a painful skin rash and fluid-filled blisters. Shingles is not related to genital herpes, which is a sexually transmitted infection. Shingles only develops in people who:  Have had chickenpox.  Have received the chickenpox vaccine. (This is rare.)  What are the causes? Shingles is caused by varicella-zoster virus (VZV). This is the same virus that causes chickenpox. After exposure to VZV, the virus stays in the body in an inactive (dormant) state. Shingles develops if the virus reactivates. This can happen many years after the initial exposure to VZV. It is not known what causes this virus to reactivate. What increases the risk? People who have had chickenpox or received the chickenpox vaccine are at risk for shingles. Infection is more common in people who:  Are older than age 39.  Have a weakened defense (immune) system, such as those with HIV, AIDS, or cancer.  Are taking medicines that weaken the immune system, such as transplant medicines.  Are under great stress.  What are the signs or symptoms? Early symptoms of this condition include itching, tingling, and pain in an area on your skin. Pain may be described as burning, stabbing, or throbbing. A few days or weeks after symptoms start, a painful red rash appears, usually on one side of the body in a bandlike or beltlike pattern. The rash eventually turns into fluid-filled blisters that break open, scab over, and dry up in about 2-3 weeks. At any time during the infection, you may also develop:  A fever.  Chills.  A headache.  An upset stomach.  How is this diagnosed? This condition is diagnosed with a skin exam. Sometimes, skin or fluid samples are taken from the blisters before a diagnosis is made. These samples are examined under a microscope or sent to a lab for testing. How is this treated? There is no specific cure for this condition.  Your health care provider will probably prescribe medicines to help you manage pain, recover more quickly, and avoid long-term problems. Medicines may include:  Antiviral drugs.  Anti-inflammatory drugs.  Pain medicines.  If the area involved is on your face, you may be referred to a specialist, such as an eye doctor (ophthalmologist) or an ear, nose, and throat (ENT) doctor to help you avoid eye problems, chronic pain, or disability. Follow these instructions at home: Medicines  Take medicines only as directed by your health care provider.  Apply an anti-itch or numbing cream to the affected area as directed by your health care provider. Blister and Rash Care  Take a cool bath or apply cool compresses to the area of the rash or blisters as directed by your health care provider. This may help with pain and itching.  Keep your rash covered with a loose bandage (dressing). Wear loose-fitting clothing to help ease the pain of material rubbing against the rash.  Keep your rash and blisters clean with mild soap and cool water or as directed by your health care provider.  Check your rash every day for signs of infection. These include redness, swelling, and pain that lasts or increases.  Do not pick your blisters.  Do not scratch your rash. General instructions  Rest as directed by your health care provider.  Keep all follow-up visits as directed by your health care provider. This is important.  Until your blisters scab over, your infection can cause chickenpox in people who have never had it or been vaccinated  against it. To prevent this from happening, avoid contact with other people, especially: ? Babies. ? Pregnant women. ? Children who have eczema. ? Elderly people who have transplants. ? People who have chronic illnesses, such as leukemia or AIDS. Contact a health care provider if:  Your pain is not relieved with prescribed medicines.  Your pain does not get better after  the rash heals.  Your rash looks infected. Signs of infection include redness, swelling, and pain that lasts or increases. Get help right away if:  The rash is on your face or nose.  You have facial pain, pain around your eye area, or loss of feeling on one side of your face.  You have ear pain or you have ringing in your ear.  You have loss of taste.  Your condition gets worse. This information is not intended to replace advice given to you by your health care provider. Make sure you discuss any questions you have with your health care provider. Document Released: 07/16/2005 Document Revised: 03/11/2016 Document Reviewed: 05/27/2014 Elsevier Interactive Patient Education  2018 [employer].

## 2017-09-24 DIAGNOSIS — J449 Chronic obstructive pulmonary disease, unspecified: Secondary | ICD-10-CM | POA: Diagnosis not present

## 2017-10-17 ENCOUNTER — Emergency Department (HOSPITAL_COMMUNITY): Payer: Medicare Other

## 2017-10-17 ENCOUNTER — Inpatient Hospital Stay (HOSPITAL_COMMUNITY)
Admission: EM | Admit: 2017-10-17 | Discharge: 2017-10-20 | DRG: 291 | Disposition: A | Discharge status: Home / Self Care | Payer: Medicare Other | Attending: Internal Medicine | Admitting: Internal Medicine

## 2017-10-17 ENCOUNTER — Encounter (HOSPITAL_COMMUNITY): Payer: Self-pay

## 2017-10-17 ENCOUNTER — Other Ambulatory Visit: Payer: Self-pay

## 2017-10-17 ENCOUNTER — Encounter: Payer: Self-pay | Admitting: Internal Medicine

## 2017-10-17 ENCOUNTER — Ambulatory Visit: Payer: Medicare Other | Admitting: Internal Medicine

## 2017-10-17 VITALS — BP 130/90 | HR 65 | Temp 98.2°F | Ht 60.0 in | Wt 211.0 lb

## 2017-10-17 DIAGNOSIS — K219 Gastro-esophageal reflux disease without esophagitis: Secondary | ICD-10-CM | POA: Diagnosis not present

## 2017-10-17 DIAGNOSIS — N183 Chronic kidney disease, stage 3 (moderate): Secondary | ICD-10-CM | POA: Diagnosis present

## 2017-10-17 DIAGNOSIS — M109 Gout, unspecified: Secondary | ICD-10-CM | POA: Clinically undetermined

## 2017-10-17 DIAGNOSIS — Z888 Allergy status to other drugs, medicaments and biological substances status: Secondary | ICD-10-CM | POA: Diagnosis not present

## 2017-10-17 DIAGNOSIS — Z881 Allergy status to other antibiotic agents status: Secondary | ICD-10-CM

## 2017-10-17 DIAGNOSIS — Z8249 Family history of ischemic heart disease and other diseases of the circulatory system: Secondary | ICD-10-CM

## 2017-10-17 DIAGNOSIS — M8580 Other specified disorders of bone density and structure, unspecified site: Secondary | ICD-10-CM | POA: Diagnosis present

## 2017-10-17 DIAGNOSIS — I10 Essential (primary) hypertension: Secondary | ICD-10-CM | POA: Diagnosis not present

## 2017-10-17 DIAGNOSIS — I4891 Unspecified atrial fibrillation: Secondary | ICD-10-CM | POA: Insufficient documentation

## 2017-10-17 DIAGNOSIS — Z800 Family history of malignant neoplasm of digestive organs: Secondary | ICD-10-CM

## 2017-10-17 DIAGNOSIS — I872 Venous insufficiency (chronic) (peripheral): Secondary | ICD-10-CM | POA: Diagnosis not present

## 2017-10-17 DIAGNOSIS — M1000 Idiopathic gout, unspecified site: Secondary | ICD-10-CM | POA: Diagnosis not present

## 2017-10-17 DIAGNOSIS — Z23 Encounter for immunization: Secondary | ICD-10-CM | POA: Diagnosis not present

## 2017-10-17 DIAGNOSIS — L97909 Non-pressure chronic ulcer of unspecified part of unspecified lower leg with unspecified severity: Secondary | ICD-10-CM | POA: Diagnosis present

## 2017-10-17 DIAGNOSIS — J449 Chronic obstructive pulmonary disease, unspecified: Secondary | ICD-10-CM | POA: Diagnosis not present

## 2017-10-17 DIAGNOSIS — E785 Hyperlipidemia, unspecified: Secondary | ICD-10-CM | POA: Diagnosis present

## 2017-10-17 DIAGNOSIS — Z6841 Body Mass Index (BMI) 40.0 and over, adult: Secondary | ICD-10-CM

## 2017-10-17 DIAGNOSIS — Z801 Family history of malignant neoplasm of trachea, bronchus and lung: Secondary | ICD-10-CM

## 2017-10-17 DIAGNOSIS — Z86010 Personal history of colonic polyps: Secondary | ICD-10-CM

## 2017-10-17 DIAGNOSIS — I5021 Acute systolic (congestive) heart failure: Secondary | ICD-10-CM | POA: Insufficient documentation

## 2017-10-17 DIAGNOSIS — Z87442 Personal history of urinary calculi: Secondary | ICD-10-CM

## 2017-10-17 DIAGNOSIS — R002 Palpitations: Secondary | ICD-10-CM

## 2017-10-17 DIAGNOSIS — I509 Heart failure, unspecified: Secondary | ICD-10-CM | POA: Diagnosis not present

## 2017-10-17 DIAGNOSIS — Z823 Family history of stroke: Secondary | ICD-10-CM | POA: Diagnosis not present

## 2017-10-17 DIAGNOSIS — K51911 Ulcerative colitis, unspecified with rectal bleeding: Secondary | ICD-10-CM | POA: Diagnosis present

## 2017-10-17 DIAGNOSIS — I5033 Acute on chronic diastolic (congestive) heart failure: Secondary | ICD-10-CM | POA: Diagnosis not present

## 2017-10-17 DIAGNOSIS — D509 Iron deficiency anemia, unspecified: Secondary | ICD-10-CM | POA: Diagnosis not present

## 2017-10-17 DIAGNOSIS — R0602 Shortness of breath: Secondary | ICD-10-CM | POA: Diagnosis not present

## 2017-10-17 DIAGNOSIS — G4733 Obstructive sleep apnea (adult) (pediatric): Secondary | ICD-10-CM | POA: Diagnosis not present

## 2017-10-17 DIAGNOSIS — Z833 Family history of diabetes mellitus: Secondary | ICD-10-CM | POA: Diagnosis not present

## 2017-10-17 DIAGNOSIS — I340 Nonrheumatic mitral (valve) insufficiency: Secondary | ICD-10-CM | POA: Diagnosis not present

## 2017-10-17 DIAGNOSIS — I480 Paroxysmal atrial fibrillation: Secondary | ICD-10-CM | POA: Insufficient documentation

## 2017-10-17 DIAGNOSIS — I130 Hypertensive heart and chronic kidney disease with heart failure and stage 1 through stage 4 chronic kidney disease, or unspecified chronic kidney disease: Principal | ICD-10-CM | POA: Diagnosis present

## 2017-10-17 DIAGNOSIS — D500 Iron deficiency anemia secondary to blood loss (chronic): Secondary | ICD-10-CM | POA: Diagnosis not present

## 2017-10-17 DIAGNOSIS — D519 Vitamin B12 deficiency anemia, unspecified: Secondary | ICD-10-CM | POA: Diagnosis present

## 2017-10-17 DIAGNOSIS — Z66 Do not resuscitate: Secondary | ICD-10-CM | POA: Diagnosis present

## 2017-10-17 DIAGNOSIS — I5031 Acute diastolic (congestive) heart failure: Secondary | ICD-10-CM | POA: Diagnosis not present

## 2017-10-17 DIAGNOSIS — Z8051 Family history of malignant neoplasm of kidney: Secondary | ICD-10-CM

## 2017-10-17 DIAGNOSIS — Z832 Family history of diseases of the blood and blood-forming organs and certain disorders involving the immune mechanism: Secondary | ICD-10-CM

## 2017-10-17 DIAGNOSIS — Z87891 Personal history of nicotine dependence: Secondary | ICD-10-CM | POA: Diagnosis not present

## 2017-10-17 DIAGNOSIS — Z885 Allergy status to narcotic agent status: Secondary | ICD-10-CM | POA: Diagnosis not present

## 2017-10-17 DIAGNOSIS — Z79899 Other long term (current) drug therapy: Secondary | ICD-10-CM

## 2017-10-17 DIAGNOSIS — Z8049 Family history of malignant neoplasm of other genital organs: Secondary | ICD-10-CM

## 2017-10-17 DIAGNOSIS — K439 Ventral hernia without obstruction or gangrene: Secondary | ICD-10-CM | POA: Diagnosis present

## 2017-10-17 DIAGNOSIS — K31819 Angiodysplasia of stomach and duodenum without bleeding: Secondary | ICD-10-CM | POA: Diagnosis present

## 2017-10-17 DIAGNOSIS — R7303 Prediabetes: Secondary | ICD-10-CM | POA: Diagnosis present

## 2017-10-17 DIAGNOSIS — L97919 Non-pressure chronic ulcer of unspecified part of right lower leg with unspecified severity: Secondary | ICD-10-CM | POA: Diagnosis present

## 2017-10-17 DIAGNOSIS — I110 Hypertensive heart disease with heart failure: Secondary | ICD-10-CM | POA: Diagnosis not present

## 2017-10-17 DIAGNOSIS — K5150 Left sided colitis without complications: Secondary | ICD-10-CM | POA: Diagnosis present

## 2017-10-17 DIAGNOSIS — Z8719 Personal history of other diseases of the digestive system: Secondary | ICD-10-CM | POA: Diagnosis not present

## 2017-10-17 DIAGNOSIS — R011 Cardiac murmur, unspecified: Secondary | ICD-10-CM | POA: Diagnosis present

## 2017-10-17 HISTORY — DX: Unspecified atrial fibrillation: I48.91

## 2017-10-17 LAB — CBC
HEMATOCRIT: 32 % — AB (ref 36.0–46.0)
Hemoglobin: 9.8 g/dL — ABNORMAL LOW (ref 12.0–15.0)
MCH: 26.6 pg (ref 26.0–34.0)
MCHC: 30.6 g/dL (ref 30.0–36.0)
MCV: 86.7 fL (ref 78.0–100.0)
PLATELETS: 198 10*3/uL (ref 150–400)
RBC: 3.69 MIL/uL — AB (ref 3.87–5.11)
RDW: 18.8 % — AB (ref 11.5–15.5)
WBC: 8.8 10*3/uL (ref 4.0–10.5)

## 2017-10-17 LAB — BASIC METABOLIC PANEL
Anion gap: 7 (ref 5–15)
BUN: 27 mg/dL — AB (ref 6–20)
CO2: 30 mmol/L (ref 22–32)
CREATININE: 1.27 mg/dL — AB (ref 0.44–1.00)
Calcium: 8.8 mg/dL — ABNORMAL LOW (ref 8.9–10.3)
Chloride: 100 mmol/L — ABNORMAL LOW (ref 101–111)
GFR, EST AFRICAN AMERICAN: 46 mL/min — AB (ref >60)
GFR, EST NON AFRICAN AMERICAN: 40 mL/min — AB (ref >60)
Glucose, Bld: 111 mg/dL — ABNORMAL HIGH (ref 65–99)
POTASSIUM: 4 mmol/L (ref 3.5–5.1)
SODIUM: 137 mmol/L (ref 135–145)

## 2017-10-17 LAB — MAGNESIUM: Magnesium: 2.3 mg/dL (ref 1.7–2.4)

## 2017-10-17 LAB — BRAIN NATRIURETIC PEPTIDE: B Natriuretic Peptide: 324.4 pg/mL — ABNORMAL HIGH (ref 0.0–100.0)

## 2017-10-17 LAB — T4, FREE: Free T4: 1.13 ng/dL — ABNORMAL HIGH (ref 0.61–1.12)

## 2017-10-17 LAB — I-STAT TROPONIN, ED: Troponin i, poc: 0 ng/mL (ref 0.00–0.08)

## 2017-10-17 LAB — TSH: TSH: 3.276 u[IU]/mL (ref 0.350–4.500)

## 2017-10-17 LAB — POC OCCULT BLOOD, ED: Fecal Occult Bld: POSITIVE — AB

## 2017-10-17 MED ORDER — VITAMIN B-12 1000 MCG PO TABS
2000.0000 ug | ORAL_TABLET | Freq: Every evening | ORAL | Status: DC
  Administered 2017-10-18 – 2017-10-19 (×2): 2000 ug via ORAL
  Filled 2017-10-17 (×2): qty 2

## 2017-10-17 MED ORDER — ONDANSETRON HCL 4 MG PO TABS: 4.0000 mg | ORAL_TABLET | Freq: Four times a day (QID) | ORAL | Status: DC | PRN

## 2017-10-17 MED ORDER — ALBUTEROL SULFATE (2.5 MG/3ML) 0.083% IN NEBU: 2.5000 mg | INHALATION_SOLUTION | Freq: Four times a day (QID) | RESPIRATORY_TRACT | Status: DC | PRN

## 2017-10-17 MED ORDER — FLUTICASONE FUROATE-VILANTEROL 100-25 MCG/INH IN AEPB
1.0000 | INHALATION_SPRAY | Freq: Every day | RESPIRATORY_TRACT | Status: DC
  Administered 2017-10-19 – 2017-10-20 (×2): 1 via RESPIRATORY_TRACT
  Filled 2017-10-17: qty 28

## 2017-10-17 MED ORDER — METOPROLOL TARTRATE 50 MG PO TABS
75.0000 mg | ORAL_TABLET | Freq: Two times a day (BID) | ORAL | Status: DC
  Administered 2017-10-18 – 2017-10-20 (×5): 75 mg via ORAL
  Filled 2017-10-17 (×5): qty 1

## 2017-10-17 MED ORDER — ONDANSETRON HCL 4 MG/2ML IJ SOLN: 4.0000 mg | Freq: Four times a day (QID) | INTRAMUSCULAR | Status: DC | PRN

## 2017-10-17 MED ORDER — FERROUS SULFATE 325 (65 FE) MG PO TABS
325.0000 mg | ORAL_TABLET | Freq: Two times a day (BID) | ORAL | Status: DC
  Administered 2017-10-18 – 2017-10-20 (×5): 325 mg via ORAL
  Filled 2017-10-17 (×6): qty 1

## 2017-10-17 MED ORDER — FUROSEMIDE 10 MG/ML IJ SOLN
80.0000 mg | Freq: Two times a day (BID) | INTRAMUSCULAR | Status: DC
  Administered 2017-10-18: 80 mg via INTRAVENOUS
  Filled 2017-10-17: qty 8

## 2017-10-17 MED ORDER — ALLOPURINOL 100 MG PO TABS
100.0000 mg | ORAL_TABLET | Freq: Every day | ORAL | Status: DC
  Administered 2017-10-18 – 2017-10-19 (×2): 100 mg via ORAL
  Filled 2017-10-17 (×3): qty 1

## 2017-10-17 MED ORDER — DILTIAZEM HCL 30 MG PO TABS
30.0000 mg | ORAL_TABLET | Freq: Once | ORAL | Status: AC
  Administered 2017-10-17: 30 mg via ORAL
  Filled 2017-10-17 (×3): qty 1

## 2017-10-17 MED ORDER — ISOSORB DINITRATE-HYDRALAZINE 20-37.5 MG PO TABS
1.0000 | ORAL_TABLET | Freq: Three times a day (TID) | ORAL | Status: DC
  Administered 2017-10-18 – 2017-10-20 (×6): 1 via ORAL
  Filled 2017-10-17 (×7): qty 1

## 2017-10-17 MED ORDER — VASCULERA PO TABS: 1.0000 | ORAL_TABLET | Freq: Every day | ORAL | Status: DC

## 2017-10-17 MED ORDER — FUROSEMIDE 10 MG/ML IJ SOLN
40.0000 mg | Freq: Once | INTRAMUSCULAR | Status: AC
  Administered 2017-10-17: 40 mg via INTRAVENOUS
  Filled 2017-10-17: qty 4

## 2017-10-17 NOTE — ED Notes (Signed)
Patient placed on 2L O2 [place].

## 2017-10-17 NOTE — Progress Notes (Signed)
Subjective:  Patient ID: [name], female    DOB: March 27, 1941  Age: 77 y.o. MRN: [id number]  CC: Atrial Fibrillation and Congestive Heart Failure   HPI [name] presents for f/up -a several week history of palpitations, weight gain, worsening shortness of breath, and worsening lower extremity edema.  Outpatient Medications Prior to Visit  Medication Sig Dispense Refill  . acetaminophen (TYLENOL) 325 MG tablet Take 650 mg by mouth every 6 (six) hours as needed for moderate pain, fever or headache.    . albuterol (PROAIR HFA) 108 (90 Base) MCG/ACT inhaler Inhale 1-2 puffs into the lungs every 6 (six) hours as needed for wheezing or shortness of breath. 18 g 11  . albuterol (PROVENTIL) (2.5 MG/3ML) 0.083% nebulizer solution Take 3 mLs (2.5 mg total) by nebulization every 6 (six) hours as needed for wheezing or shortness of breath. 150 mL 11  . allopurinol (ZYLOPRIM) 100 MG tablet TAKE 1 TABLET BY MOUTH EVERY DAY FOR GOUT 90 tablet 0  . cyanocobalamin 2000 MCG tablet Take 1 tablet (2,000 mcg total) by mouth daily. (Patient taking differently: Take 2,000 mcg by mouth every evening. ) 90 tablet 3  . Dietary Management Product (VASCULERA) TABS Take 1 tablet by mouth daily. 90 tablet 1  . ferrous sulfate 325 (65 FE) MG tablet Take 1 tablet (325 mg total) by mouth 2 (two) times daily with a meal. 60 tablet 11  . fluticasone furoate-vilanterol (BREO ELLIPTA) 100-25 MCG/INH AEPB Inhale 1 puff into the lungs daily. 60 each 11  . hydrocerin (EUCERIN) CREA Apply 1 application topically as directed. 454 g 0  . isosorbide-hydrALAZINE (BIDIL) 20-37.5 MG tablet Take 1 tablet by mouth 3 (three) times daily. 270 tablet 1  . metoprolol tartrate (LOPRESSOR) 50 MG tablet Take 1 tablet (50 mg total) by mouth 2 (two) times daily. 180 tablet 3  . torsemide (DEMADEX) 20 MG tablet Take 2 tablets (40 mg total) by mouth daily. 60 tablet 0  . Vitamin D, Ergocalciferol, (DRISDOL) 50000 units CAPS capsule TAKE  ONE CAPSULE BY MOUTH ONCE WEEKLY 12 capsule 1  . oxyCODONE-acetaminophen (PERCOCET) 7.5-325 MG tablet Take 1 tablet by mouth every 4 (four) hours as needed. 65 tablet 0  . promethazine (PHENERGAN) 12.5 MG tablet Take 1 tablet (12.5 mg total) by mouth every 6 (six) hours as needed for nausea or vomiting. 30 tablet 0  . promethazine-dextromethorphan (PROMETHAZINE-DM) 6.25-15 MG/5ML syrup Take 5 mLs by mouth 4 (four) times daily as needed for cough.  0   No facility-administered medications prior to visit.     ROS Review of Systems  Constitutional: Positive for fatigue and unexpected weight change. Negative for appetite change.  HENT: Negative.   Eyes: Negative for visual disturbance.  Respiratory: Positive for shortness of breath. Negative for cough, chest tightness and wheezing.   Cardiovascular: Positive for palpitations and leg swelling. Negative for chest pain.  Gastrointestinal: Negative for abdominal pain, diarrhea, nausea and vomiting.  Endocrine: Negative.   Genitourinary: Negative.  Negative for difficulty urinating.  Musculoskeletal: Negative.  Negative for arthralgias and myalgias.  Skin: Negative.  Negative for color change and pallor.  Neurological: Negative.  Negative for dizziness, weakness and light-headedness.  Hematological: Negative for adenopathy. Does not bruise/bleed easily.  Psychiatric/Behavioral: Negative.     Objective:  BP 130/90 (BP Location: Left Arm, Patient Position: Sitting, Cuff Size: Large)   Pulse 65   Temp 98.2 F (36.8 C) (Oral)   Ht 5' (1.524 m)   Wt 211  lb (95.7 kg)   SpO2 95%   BMI 41.21 kg/m   BP Readings from Last 3 Encounters:  10/17/17 (!) 153/63  10/17/17 130/90  09/19/17 (!) 150/78    Wt Readings from Last 3 Encounters:  10/17/17 211 lb (95.7 kg)  09/19/17 204 lb 3 oz (92.6 kg)  09/10/17 204 lb 12.8 oz (92.9 kg)    Physical Exam  Constitutional: She is oriented to person, place, and time.  HENT:  Mouth/Throat: Oropharynx  is clear and moist. No oropharyngeal exudate.  Eyes: Conjunctivae are normal. Left eye exhibits no discharge. No scleral icterus.  Neck: Normal range of motion. Neck supple. No JVD present. No thyromegaly present.  Cardiovascular: Normal rate. An irregularly irregular rhythm present. Exam reveals gallop and S3.  EKG---  Atrial fibrillation  ABNORMAL RHYTHM - A fib is a new rhythm for her  Pulmonary/Chest: Effort normal and breath sounds normal. No respiratory distress. She has no wheezes. She has no rales.  Abdominal: Soft. Bowel sounds are normal. She exhibits no mass. There is no tenderness. There is no rebound.  Musculoskeletal: Normal range of motion. She exhibits edema. She exhibits no tenderness.  2+ pitting edema in BLE  Lymphadenopathy:    She has no cervical adenopathy.  Neurological: She is alert and oriented to person, place, and time.  Skin: Skin is warm. No rash noted. No erythema. No pallor.  Vitals reviewed.   Lab Results  Component Value Date   WBC 8.8 10/17/2017   HGB 9.8 (L) 10/17/2017   HCT 32.0 (L) 10/17/2017   PLT 198 10/17/2017   GLUCOSE 111 (H) 10/17/2017   CHOL 175 09/04/2016   TRIG 215.0 (H) 09/04/2016   HDL 46.10 09/04/2016   LDLDIRECT 107.0 09/04/2016   LDLCALC 93 08/17/2015   ALT 11 (L) 08/08/2017   AST 17 08/08/2017   NA 137 10/17/2017   K 4.0 10/17/2017   CL 100 (L) 10/17/2017   CREATININE 1.27 (H) 10/17/2017   BUN 27 (H) 10/17/2017   CO2 30 10/17/2017   TSH 3.22 08/08/2017   INR 1.07 08/08/2017   HGBA1C 5.7 08/19/2017   MICROALBUR 15.9 (H) 02/05/2013    Dg Chest 2 View  Result Date: 08/08/2017 CLINICAL DATA:  Shortness of breath and low hemoglobin. EXAM: CHEST  2 VIEW COMPARISON:  07/18/2017 FINDINGS: Lungs are adequately inflated demonstrate mild hazy prominence of the perihilar and bibasilar regions without significant change likely mild vascular congestion. Small bilateral pleural effusions unchanged to slightly worse. Mild stable  cardiomegaly. Remainder of the exam is unchanged. IMPRESSION: Findings suggesting mild CHF with slightly worse small bilateral pleural effusions. Electronically Signed   By: Marin Olp M.D.   On: 08/08/2017 19:42   Dg Chest Port 1 View  Result Date: 08/09/2017 CLINICAL DATA:  77 year old female with CHF. EXAM: PORTABLE CHEST 1 VIEW COMPARISON:  Chest radiograph dated 08/08/2017. FINDINGS: Small bilateral effusions with associated atelectatic changes of the lung bases, grossly similar to prior radiograph. Pneumonia is not excluded. Clinical correlation is recommended. No pneumothorax. Stable mild cardiomegaly. Atherosclerotic calcification of the aorta. No acute osseous pathology. IMPRESSION: No significant change in the bilateral pleural effusions are atelectatic changes of the lung bases. Clinical correlation and follow-up recommended. Electronically Signed   By: Anner Crete M.D.   On: 08/09/2017 05:33    Assessment & Plan:   Tran was seen today for atrial fibrillation and congestive heart failure.  Diagnoses and all orders for this visit:  Palpitation -  EKG 12-Lead  Atrial fibrillation, unspecified type ([place])- She has new onset atrial fibrillation with a heart rate of about 110.  She is not tolerating this well with multiple symptoms and an exacerbation of both systolic and diastolic heart failure.  Acute systolic congestive heart failure ([place])- she will be transferred to the ED to be treated for A. fib with RVR and fluid overload.   I have discontinued Larry Sierras. Eschbach's promethazine-dextromethorphan, promethazine, and oxyCODONE-acetaminophen. I am also having her maintain her cyanocobalamin, ferrous sulfate, albuterol, hydrocerin, VASCULERA, fluticasone furoate-vilanterol, albuterol, acetaminophen, torsemide, Vitamin D (Ergocalciferol), allopurinol, metoprolol tartrate, and isosorbide-hydrALAZINE.  No orders of the defined types were placed in this  encounter.    Follow-up: No follow-ups on file.  [name], MD

## 2017-10-17 NOTE — H&P (Signed)
History and Physical    [name] NFA:[id number] DOB: 12-30-1940 DOA: 10/17/2017  PCP: [name], MD  Patient coming from: Home.  Chief Complaint: Shortness of breath and palpitations.  HPI: [name] is a 77 y.o. female with history of diastolic CHF, chronic blood loss anemia secondary to chronic GI bleed, history of ulcerative colitis, chronic kidney disease, COPD has been experiencing palpitations since March 9 3 weeks ago.  Also has been noticing increasing lower extremity edema shortness of breath and increasing weight and has gained at least 7 pounds over the last 1 month.  Has had chest pain off and on pressure-like.  Had gone to her PCP and was found to be in A. fib with RVR and referred to the ER.  ED Course: In the ER patient chest x-ray shows pleural effusion and congestion.  Patient was in A. fib with RVR and was given oral Cardizem p.o. 30 mg.  Lasix 40 mg IV was given for CHF.  At the time of my exam patient is not in distress but heart rate is around 120 in A. fib with RVR.  Patient is being admitted for new onset A. fib with RVR and CHF.  Patient states she has been having palpitation off and on for last 3 years.   Review of Systems: As per HPI, rest all negative.   Past Medical History:  Diagnosis Date  . CHF (congestive heart failure) ([place]) 08/31/03  . Colonic polyp 02/16/2008   Tubular adenoma  . COPD (chronic obstructive pulmonary disease) ([place]) 01/27/98  . GERD (gastroesophageal reflux disease) 07/30/92  . Gout   . HTN (hypertension) 07/30/1988  . Hyperlipidemia 11/27/96  . Kidney stone 1960, 1972, 1991  . Renal insufficiency   . Ulcerative colitis     Past Surgical History:  Procedure Laterality Date  . BRONCHOSCOPY    . COLONOSCOPY WITH PROPOFOL N/A 02/23/2015   Procedure: COLONOSCOPY WITH PROPOFOL;  Surgeon: Ladene Artist, MD;  Location: [facility];  Service: Endoscopy;  Laterality: N/A;  . ESOPHAGOGASTRODUODENOSCOPY (EGD) WITH PROPOFOL N/A  02/23/2015   Procedure: ESOPHAGOGASTRODUODENOSCOPY (EGD) WITH PROPOFOL;  Surgeon: Ladene Artist, MD;  Location: [facility];  Service: Endoscopy;  Laterality: N/A;  . NO PAST SURGERIES       reports that she quit smoking about 21 years ago. She has never used smokeless tobacco. She reports that she does not drink alcohol or use drugs.  Allergies  Allergen Reactions  . Celebrex [Celecoxib]     edema  . Enalapril Cough  . Lipitor [Atorvastatin]     Muscle aches  . Amlodipine Besylate     REACTION: edema  . Amoxicillin     REACTION: Diarrhea  . Codeine Sulfate     REACTION: Nausea  . Hydrocodone-Acetaminophen     REACTION: Nausea    Family History  Problem Relation Age of Onset  . Stroke Mother   . Diabetes Mother   . Kidney cancer Mother   . Stomach cancer Mother   . Heart disease Mother   . Stroke Father   . Heart disease Brother   . Heart disease Brother   . Crohn's disease Brother   . Heart disease Sister        CATH,STENT  . Clotting disorder Sister   . Cervical cancer Sister   . Lung cancer Sister   . Heart attack Sister   . Diabetes Sister   . Colon cancer Neg Hx     Prior  to Admission medications   Medication Sig Start Date End Date Taking? Authorizing Provider  acetaminophen (TYLENOL) 325 MG tablet Take 650 mg by mouth every 6 (six) hours as needed for moderate pain, fever or headache.   Yes [provider]  albuterol (PROAIR HFA) 108 (90 Base) MCG/ACT inhaler Inhale 1-2 puffs into the lungs every 6 (six) hours as needed for wheezing or shortness of breath. 10/30/16  Yes [name], MD  albuterol (PROVENTIL) (2.5 MG/3ML) 0.083% nebulizer solution Take 3 mLs (2.5 mg total) by nebulization every 6 (six) hours as needed for wheezing or shortness of breath. 07/18/17  Yes [name], MD  allopurinol (ZYLOPRIM) 100 MG tablet TAKE 1 TABLET BY MOUTH EVERY DAY FOR GOUT Patient taking differently: TAKE 100 mg TABLET BY MOUTH EVERY DAY FOR GOUT 08/18/17   Yes [name], MD  cyanocobalamin 2000 MCG tablet Take 1 tablet (2,000 mcg total) by mouth daily. Patient taking differently: Take 2,000 mcg by mouth every evening.  02/24/16  Yes [name], MD  Dietary Management Product (VASCULERA) TABS Take 1 tablet by mouth daily. 05/09/17  Yes [name], MD  ferrous sulfate 325 (65 FE) MG tablet Take 1 tablet (325 mg total) by mouth 2 (two) times daily with a meal. 02/24/16  Yes [name], MD  fluticasone furoate-vilanterol (BREO ELLIPTA) 100-25 MCG/INH AEPB Inhale 1 puff into the lungs daily. 07/18/17  Yes [name], MD  hydrocerin (EUCERIN) CREA Apply 1 application topically as directed. Patient taking differently: Apply 1 application topically as needed.  11/11/16  Yes Sheikh, Omair Latif, DO  isosorbide-hydrALAZINE (BIDIL) 20-37.5 MG tablet Take 1 tablet by mouth 3 (three) times daily. 09/19/17  Yes [name], MD  metoprolol tartrate (LOPRESSOR) 50 MG tablet Take 1 tablet (50 mg total) by mouth 2 (two) times daily. 09/10/17 12/09/17 Yes Jettie Booze, MD  torsemide (DEMADEX) 20 MG tablet Take 2 tablets (40 mg total) by mouth daily. 08/15/17  Yes Ghimire, Henreitta Leber, MD  Vitamin D, Ergocalciferol, (DRISDOL) 50000 units CAPS capsule TAKE ONE CAPSULE BY MOUTH ONCE WEEKLY Patient taking differently: TAKE 50,000 CAPSULE BY MOUTH ONCE WEEKLY 08/15/17  Yes [name], MD  olmesartan-hydrochlorothiazide (BENICAR HCT) 40-25 MG per tablet Take 1 tablet by mouth daily. 09/25/11 10/10/11  [name], MD    Physical Exam: Vitals:   10/17/17 2215 10/17/17 2230 10/17/17 2245 10/17/17 2300  BP:      Pulse: (!) 35 61 69 60  Resp: 19 (!) 21 (!) 22 (!) 29  Temp:      TempSrc:      SpO2: 100% 100% 100% 99%      Constitutional: Moderately built and nourished. Vitals:   10/17/17 2215 10/17/17 2230 10/17/17 2245 10/17/17 2300  BP:      Pulse: (!) 35 61 69 60  Resp: 19 (!) 21 (!) 22 (!) 29  Temp:      TempSrc:        SpO2: 100% 100% 100% 99%   Eyes: Anicteric no pallor. ENMT: No discharge from the ears eyes nose or mouth. Neck: JVD elevated no mass felt. Respiratory: No rhonchi mild basilar crepitations. Cardiovascular: S1-S2 heard tachycardic. Abdomen: Soft nontender bowel sounds present. Musculoskeletal: Bilateral lower extremity edema. Skin: Chronic skin changes of the lower extremities. Neurologic: Alert awake oriented to time place and person.  Moves all extremities. Psychiatric: Appears normal.  Normal affect.   Labs on Admission: I have personally reviewed  following labs and imaging studies  CBC: Recent Labs  Lab 10/17/17 1351  WBC 8.8  HGB 9.8*  HCT 32.0*  MCV 86.7  PLT 465   Basic Metabolic Panel: Recent Labs  Lab 10/17/17 1351 10/17/17 2046  NA 137  --   K 4.0  --   CL 100*  --   CO2 30  --   GLUCOSE 111*  --   BUN 27*  --   CREATININE 1.27*  --   CALCIUM 8.8*  --   MG  --  2.3   GFR: Estimated Creatinine Clearance: 38.4 mL/min (A) (by C-G formula based on SCr of 1.27 mg/dL (H)). Liver Function Tests: No results for input(s): AST, ALT, ALKPHOS, BILITOT, PROT, ALBUMIN in the last 168 hours. No results for input(s): LIPASE, AMYLASE in the last 168 hours. No results for input(s): AMMONIA in the last 168 hours. Coagulation Profile: No results for input(s): INR, PROTIME in the last 168 hours. Cardiac Enzymes: No results for input(s): CKTOTAL, CKMB, CKMBINDEX, TROPONINI in the last 168 hours. BNP (last 3 results) Recent Labs    08/08/17 1622  PROBNP 304.0*   HbA1C: No results for input(s): HGBA1C in the last 72 hours. CBG: No results for input(s): GLUCAP in the last 168 hours. Lipid Profile: No results for input(s): CHOL, HDL, LDLCALC, TRIG, CHOLHDL, LDLDIRECT in the last 72 hours. Thyroid Function Tests: Recent Labs    10/17/17 2046  TSH 3.276  FREET4 1.13*   Anemia Panel: No results for input(s): VITAMINB12, FOLATE, FERRITIN, TIBC, IRON, RETICCTPCT in  the last 72 hours. Urine analysis:    Component Value Date/Time   COLORURINE YELLOW 08/23/2017 [place] 08/23/2017 1308   LABSPEC <=1.005 (A) 08/23/2017 1308   PHURINE 6.5 08/23/2017 1308   GLUCOSEU NEGATIVE 08/23/2017 1308   HGBUR NEGATIVE 08/23/2017 1308   HGBUR moderate 02/02/2008 1439   BILIRUBINUR NEGATIVE 08/23/2017 1308   KETONESUR NEGATIVE 08/23/2017 1308   PROTEINUR 100 (A) 06/01/2013 1722   UROBILINOGEN 0.2 08/23/2017 1308   NITRITE NEGATIVE 08/23/2017 1308   LEUKOCYTESUR SMALL (A) 08/23/2017 1308   Sepsis Labs: @LABRCNTIP (procalcitonin:4,lacticidven:4) )No results found for this or any previous visit (from the past 240 hour(s)).   Radiological Exams on Admission: Dg Chest 2 View  Result Date: 10/17/2017 CLINICAL DATA:  Shortness of breath. EXAM: CHEST - 2 VIEW COMPARISON:  08/10/2017. FINDINGS: Mediastinum and hilar structures normal. Stable cardiomegaly with normal pulmonary vascularity. Interval clearing of basilar pulmonary infiltrates/edema. Low lung volumes with mild basilar atelectasis. Small left pleural effusion. Biapical pleural thickening noted consistent with scarring. No pneumothorax. Degenerative changes thoracic spine. IMPRESSION: 1. Cardiomegaly with normal pulmonary vascularity. Interval clearing of basilar pulmonary infiltrates/edema. Low lung volumes with mild basilar atelectasis. 2.  Small left pleural effusion. Electronically Signed   By: Marcello Moores  Register   On: 10/17/2017 14:23    EKG: Independently reviewed.  A. fib with RVR.  Assessment/Plan Principal Problem:   Acute diastolic HF (heart failure), NYHA class 1 (HCC) Active Problems:   Gout   ULCERATIVE COLITIS-LEFT SIDE   Ulcer of leg, chronic, right (HCC)   Chronic venous stasis dermatitis of both lower extremities   OSA (obstructive sleep apnea)   Anemia, iron deficiency   Angiodysplasia of duodenum   Acute CHF (congestive heart failure) (HCC)   Atrial fibrillation with RVR  ([place])    1. Acute on chronic diastolic CHF last EF measured in January 2019 3 months ago was 60-65% with grade 2 diastolic dysfunction -  no decompensated in the setting of A. fib with RVR.  Patient has been placed on Lasix 40 mg IV every 12.  Closely follow intake output and daily weights.  Follow metabolic panel.  Check cardiac markers. 2. A. fib with RVR -I have increased patient's metoprolol from 50 mg p.o. twice daily to 75 mg p.o. twice daily 1 dose now.  Patient also received 1 dose of Cardizem p.o. 30 mg.  Closely monitor in stepdown.  If that is further worsening of heart rate will start Cardizem infusion.  Check thyroid function tests cardiac markers 2D echo.  Patient's chads 2 vasc score is full but not on anticoagulation secondary to history of chronic GI bleed. 3. Chronic GI bleed with chronic anemia -stool for occult blood is positive.  Closely follow CBC. 4. COPD -presently not wheezing. 5. Chronic ulceration of the lower extremities with stasis dermatitis. 6. History of gout on allopurinol. 7. Chronic kidney disease stage III -follow metabolic panel.   DVT prophylaxis: SCDs. Code Status: Full code. Family Communication: Discussed with patient. Disposition Plan: Home. Consults called: Cardiology. Admission status: Inpatient.   Rise Patience MD [facility] Pager [phone].  If 7PM-7AM, please contact night-coverage [email or web address] Password [facility]  10/17/2017, 11:09 PM

## 2017-10-17 NOTE — ED Notes (Signed)
Admitting at bedside 

## 2017-10-17 NOTE — ED Notes (Signed)
purewick placed

## 2017-10-17 NOTE — ED Triage Notes (Signed)
Pt was sent by PCP for shortness of breath and palpitations. Pt states she had elevated HR 4 years ago. Pt alert and oriented. Skin warm and dry, no distress noted. Pt states hx of CHF as well.

## 2017-10-17 NOTE — ED Provider Notes (Signed)
[facility] EMERGENCY DEPARTMENT Provider Note   CSN: [id number] Arrival date & time: 10/17/17  1303     History   Chief Complaint Chief Complaint  Patient presents with  . Shortness of Breath    HPI [name] is a 77 y.o. female.  The history is provided by the patient.  Shortness of Breath  This is a recurrent problem. The average episode lasts 2 weeks. The problem occurs continuously.The current episode started more than 1 week ago. The problem has been gradually worsening. Associated symptoms include orthopnea and leg swelling. Pertinent negatives include no fever, no headaches, no sore throat, no neck pain, no cough, no wheezing, no chest pain, no syncope, no vomiting, no abdominal pain and no rash. Associated medical issues include COPD, chronic lung disease and heart failure.  -sent by PCP for worsening CHF symptoms and new onset afib  Past Medical History:  Diagnosis Date  . CHF (congestive heart failure) ([place]) 08/31/03  . Colonic polyp 02/16/2008   Tubular adenoma  . COPD (chronic obstructive pulmonary disease) ([place]) 01/27/98  . GERD (gastroesophageal reflux disease) 07/30/92  . Gout   . HTN (hypertension) 07/30/1988  . Hyperlipidemia 11/27/96  . Kidney stone 1960, 1972, 1991  . Renal insufficiency   . Ulcerative colitis     Patient Active Problem List   Diagnosis Date Noted  . Atrial fibrillation ([place]) 10/17/2017  . Acute systolic congestive heart failure ([place]) 10/17/2017  . Acute CHF (congestive heart failure) ([place]) 10/17/2017  . Acute diastolic HF (heart failure), NYHA class 1 ([place]) 10/17/2017  . Atrial fibrillation with RVR ([place]) 10/17/2017  . CHF (congestive heart failure), NYHA class III, chronic, combined ([place]) 08/10/2017  . Angiodysplasia of cecum   . Angiodysplasia of duodenum   . Grade III diastolic dysfunction 05/23/8526  . MGUS (monoclonal gammopathy of unknown significance) 11/08/2016  . Asteatotic eczema 02/25/2016  . COLD  (chronic obstructive lung disease) ([place])   . Anemia, iron deficiency 11/16/2014  . Cor pulmonale ([place]) 11/16/2014  . Palpitation 08/31/2013  . OSA (obstructive sleep apnea) 06/22/2013  . Chronic venous stasis dermatitis of both lower extremities 06/02/2013  . Vitamin D deficiency 02/06/2013  . Osteopenia, senile 02/05/2013  . Other screening mammogram 02/05/2013  . Ulcer of leg, chronic, right ([place]) 10/10/2011  . CKD (chronic kidney disease), stage III ([place]) 06/20/2010  . Morbid obesity ([place]) 04/19/2010  . Gout 01/10/2009  . ULCERATIVE COLITIS-LEFT SIDE 03/19/2008  . Vitamin B12 deficiency anemia 11/13/2006  . Prediabetes 11/13/2006  . Hyperlipidemia with target LDL less than 100 11/27/1996  . GERD 07/30/1992  . HTN (hypertension) 07/30/1988    Past Surgical History:  Procedure Laterality Date  . BRONCHOSCOPY    . COLONOSCOPY WITH PROPOFOL N/A 02/23/2015   Procedure: COLONOSCOPY WITH PROPOFOL;  Surgeon: Ladene Artist, MD;  Location: [facility] ENDOSCOPY;  Service: Endoscopy;  Laterality: N/A;  . ESOPHAGOGASTRODUODENOSCOPY (EGD) WITH PROPOFOL N/A 02/23/2015   Procedure: ESOPHAGOGASTRODUODENOSCOPY (EGD) WITH PROPOFOL;  Surgeon: Ladene Artist, MD;  Location: [facility] ENDOSCOPY;  Service: Endoscopy;  Laterality: N/A;  . NO PAST SURGERIES      OB History   None      Home Medications    Prior to Admission medications   Medication Sig Start Date End Date Taking? Authorizing Provider  acetaminophen (TYLENOL) 325 MG tablet Take 650 mg by mouth every 6 (six) hours as needed for moderate pain, fever or headache.   Yes [provider]  albuterol (PROAIR HFA) 108 (  90 Base) MCG/ACT inhaler Inhale 1-2 puffs into the lungs every 6 (six) hours as needed for wheezing or shortness of breath. 10/30/16  Yes [name], MD  albuterol (PROVENTIL) (2.5 MG/3ML) 0.083% nebulizer solution Take 3 mLs (2.5 mg total) by nebulization every 6 (six) hours as needed for wheezing or shortness of breath.  07/18/17  Yes [name], MD  allopurinol (ZYLOPRIM) 100 MG tablet TAKE 1 TABLET BY MOUTH EVERY DAY FOR GOUT Patient taking differently: TAKE 100 mg TABLET BY MOUTH EVERY DAY FOR GOUT 08/18/17  Yes [name], MD  cyanocobalamin 2000 MCG tablet Take 1 tablet (2,000 mcg total) by mouth daily. Patient taking differently: Take 2,000 mcg by mouth every evening.  02/24/16  Yes [name], MD  Dietary Management Product (VASCULERA) TABS Take 1 tablet by mouth daily. 05/09/17  Yes [name], MD  ferrous sulfate 325 (65 FE) MG tablet Take 1 tablet (325 mg total) by mouth 2 (two) times daily with a meal. 02/24/16  Yes [name], MD  fluticasone furoate-vilanterol (BREO ELLIPTA) 100-25 MCG/INH AEPB Inhale 1 puff into the lungs daily. 07/18/17  Yes [name], MD  hydrocerin (EUCERIN) CREA Apply 1 application topically as directed. Patient taking differently: Apply 1 application topically as needed.  11/11/16  Yes Sheikh, Omair Latif, DO  isosorbide-hydrALAZINE (BIDIL) 20-37.5 MG tablet Take 1 tablet by mouth 3 (three) times daily. 09/19/17  Yes [name], MD  metoprolol tartrate (LOPRESSOR) 50 MG tablet Take 1 tablet (50 mg total) by mouth 2 (two) times daily. 09/10/17 12/09/17 Yes Jettie Booze, MD  torsemide (DEMADEX) 20 MG tablet Take 2 tablets (40 mg total) by mouth daily. 08/15/17  Yes Ghimire, Henreitta Leber, MD  Vitamin D, Ergocalciferol, (DRISDOL) 50000 units CAPS capsule TAKE ONE CAPSULE BY MOUTH ONCE WEEKLY Patient taking differently: TAKE 50,000 CAPSULE BY MOUTH ONCE WEEKLY 08/15/17  Yes [name], MD  olmesartan-hydrochlorothiazide (BENICAR HCT) 40-25 MG per tablet Take 1 tablet by mouth daily. 09/25/11 10/10/11  [name], MD    Family History Family History  Problem Relation Age of Onset  . Stroke Mother   . Diabetes Mother   . Kidney cancer Mother   . Stomach cancer Mother   . Heart disease Mother   . Stroke Father   . Heart disease Brother     . Heart disease Brother   . Crohn's disease Brother   . Heart disease Sister        CATH,STENT  . Clotting disorder Sister   . Cervical cancer Sister   . Lung cancer Sister   . Heart attack Sister   . Diabetes Sister   . Colon cancer Neg Hx     Social History Social History   Tobacco Use  . Smoking status: Former Smoker    Last attempt to quit: 06/01/1996    Years since quitting: 21.3  . Smokeless tobacco: Never Used  . Tobacco comment: Quit 1998  Substance Use Topics  . Alcohol use: No  . Drug use: No     Allergies   Celebrex [celecoxib]; Enalapril; Lipitor [atorvastatin]; Amlodipine besylate; Amoxicillin; Codeine sulfate; and Hydrocodone-acetaminophen   Review of Systems Review of Systems  Constitutional: Positive for fatigue. Negative for chills and fever.  HENT: Negative for sore throat.   Eyes: Negative for visual disturbance.  Respiratory: Positive for shortness of breath. Negative for cough and wheezing.   Cardiovascular: Positive for palpitations, orthopnea and leg swelling. Negative  for chest pain and syncope.  Gastrointestinal: Negative for abdominal pain, nausea and vomiting.  Genitourinary: Negative for dysuria and hematuria.  Musculoskeletal: Negative for back pain and neck pain.  Skin: Negative for rash.  Neurological: Negative for seizures, syncope, light-headedness and headaches.  All other systems reviewed and are negative.    Physical Exam Updated Vital Signs BP (!) 176/74   Pulse 60   Temp 98.1 F (36.7 C) (Oral)   Resp (!) 29   SpO2 99%   Physical Exam  Constitutional: She appears well-developed and well-nourished. No distress.  HENT:  Head: Normocephalic and atraumatic.  Eyes: Conjunctivae are normal.  Neck: Neck supple.  Cardiovascular: Intact distal pulses. An irregularly irregular rhythm present. Tachycardia present.  No murmur heard. Pulmonary/Chest: Effort normal. Tachypnea noted. No respiratory distress. She has rales in the  right lower field and the left lower field.  Abdominal: Soft. She exhibits no distension. There is no tenderness.  Ventral hernia present, reducible  Musculoskeletal:       Right lower leg: She exhibits edema.       Left lower leg: She exhibits edema.  Neurological: She is alert.  Skin: Skin is warm and dry.  Psychiatric: She has a normal mood and affect.  Nursing note and vitals reviewed.    ED Treatments / Results  Labs (all labs ordered are listed, but only abnormal results are displayed) Labs Reviewed  BASIC METABOLIC PANEL - Abnormal; Notable for the following components:      Result Value   Chloride 100 (*)    Glucose, Bld 111 (*)    BUN 27 (*)    Creatinine, Ser 1.27 (*)    Calcium 8.8 (*)    GFR calc non Af Amer 40 (*)    GFR calc Af Amer 46 (*)    All other components within normal limits  CBC - Abnormal; Notable for the following components:   RBC 3.69 (*)    Hemoglobin 9.8 (*)    HCT 32.0 (*)    RDW 18.8 (*)    All other components within normal limits  BRAIN NATRIURETIC PEPTIDE - Abnormal; Notable for the following components:   B Natriuretic Peptide 324.4 (*)    All other components within normal limits  T4, FREE - Abnormal; Notable for the following components:   Free T4 1.13 (*)    All other components within normal limits  POC OCCULT BLOOD, ED - Abnormal; Notable for the following components:   Fecal Occult Bld POSITIVE (*)    All other components within normal limits  MAGNESIUM  TSH  OCCULT BLOOD X 1 CARD TO LAB, STOOL  I-STAT TROPONIN, ED    EKG  EKG Interpretation  Date/Time:  Thursday October 17 2017 13:09:57 EDT Ventricular Rate:  106 PR Interval:    QRS Duration: 80 QT Interval:  316 QTC Calculation: 419 R Axis:   38 Text Interpretation:  Atrial fibrillation with rapid ventricular response Abnormal ECG afib new Confirmed by Merrily Pew [phone]) on 10/17/2017 1:16:47 PM       Radiology Dg Chest 2 View  Result Date:  10/17/2017 CLINICAL DATA:  Shortness of breath. EXAM: CHEST - 2 VIEW COMPARISON:  08/10/2017. FINDINGS: Mediastinum and hilar structures normal. Stable cardiomegaly with normal pulmonary vascularity. Interval clearing of basilar pulmonary infiltrates/edema. Low lung volumes with mild basilar atelectasis. Small left pleural effusion. Biapical pleural thickening noted consistent with scarring. No pneumothorax. Degenerative changes thoracic spine. IMPRESSION: 1. Cardiomegaly with normal pulmonary vascularity. Interval clearing  of basilar pulmonary infiltrates/edema. Low lung volumes with mild basilar atelectasis. 2.  Small left pleural effusion. Electronically Signed   By: Marcello Moores  Register   On: 10/17/2017 14:23    Procedures Procedures (including critical care time)  Medications Ordered in ED Medications  diltiazem (CARDIZEM) tablet 30 mg (has no administration in time range)  furosemide (LASIX) injection 40 mg (40 mg Intravenous Given 10/17/17 2151)     Initial Impression / Assessment and Plan / ED Course  I have reviewed the triage vital signs and the nursing notes.  Pertinent labs & imaging results that were available during my care of the patient were reviewed by me and considered in my medical decision making (see chart for details).     Patient is a 77 year old female with history of COPD, CHF, hypertension, hyperlipidemia, ulcerative colitis who presents with 2 weeks of worsening shortness of breath.  Patient sent from PCP for new onset atrial fibrillation with RVR and worsening CHF symptoms.  Presentation consistent with CHF and A. fib with RVR given she has bibasilar crackles, has A. fib on the monitor, 3+ pitting edema bilateral.  Given her heart rate is mostly between 90 and 110, will hold off on any intravenous Cardizem.  Patient given p.o. Cardizem 30 mg.  IV Lasix 40 mg given.  Troponin was negative.  EKG shows A. fib with RVR.  Anticoagulation will be difficult/contraindicated  given she has recent GI bleeding and history of ulcerative colitis.  Of note her hemoglobin is 2 points below her baseline today and Hemoccult is positive.  Patient will be admitted to the hospitalist.  Final Clinical Impressions(s) / ED Diagnoses   Final diagnoses:  Congestive heart failure, unspecified HF chronicity, unspecified heart failure type [facility])  Atrial fibrillation with RVR [facility])    ED Discharge Orders    None       Tobie Poet, DO 10/17/17 2313    Margette Fast, MD 10/17/17 2358    Margette Fast, MD 10/24/17 [phone]

## 2017-10-18 ENCOUNTER — Inpatient Hospital Stay (HOSPITAL_COMMUNITY): Payer: Medicare Other

## 2017-10-18 ENCOUNTER — Other Ambulatory Visit: Payer: Self-pay

## 2017-10-18 DIAGNOSIS — I872 Venous insufficiency (chronic) (peripheral): Secondary | ICD-10-CM

## 2017-10-18 DIAGNOSIS — I340 Nonrheumatic mitral (valve) insufficiency: Secondary | ICD-10-CM

## 2017-10-18 DIAGNOSIS — M1000 Idiopathic gout, unspecified site: Secondary | ICD-10-CM

## 2017-10-18 DIAGNOSIS — I4891 Unspecified atrial fibrillation: Secondary | ICD-10-CM

## 2017-10-18 DIAGNOSIS — G4733 Obstructive sleep apnea (adult) (pediatric): Secondary | ICD-10-CM

## 2017-10-18 DIAGNOSIS — D500 Iron deficiency anemia secondary to blood loss (chronic): Secondary | ICD-10-CM

## 2017-10-18 DIAGNOSIS — I5031 Acute diastolic (congestive) heart failure: Secondary | ICD-10-CM

## 2017-10-18 LAB — CBC
HEMATOCRIT: 29.3 % — AB (ref 36.0–46.0)
Hemoglobin: 9.3 g/dL — ABNORMAL LOW (ref 12.0–15.0)
MCH: 27.5 pg (ref 26.0–34.0)
MCHC: 31.7 g/dL (ref 30.0–36.0)
MCV: 86.7 fL (ref 78.0–100.0)
Platelets: 174 10*3/uL (ref 150–400)
RBC: 3.38 MIL/uL — ABNORMAL LOW (ref 3.87–5.11)
RDW: 18.6 % — AB (ref 11.5–15.5)
WBC: 6.6 10*3/uL (ref 4.0–10.5)

## 2017-10-18 LAB — BASIC METABOLIC PANEL
ANION GAP: 11 (ref 5–15)
BUN: 24 mg/dL — AB (ref 6–20)
CO2: 29 mmol/L (ref 22–32)
Calcium: 8.6 mg/dL — ABNORMAL LOW (ref 8.9–10.3)
Chloride: 100 mmol/L — ABNORMAL LOW (ref 101–111)
Creatinine, Ser: 1.26 mg/dL — ABNORMAL HIGH (ref 0.44–1.00)
GFR, EST AFRICAN AMERICAN: 46 mL/min — AB (ref >60)
GFR, EST NON AFRICAN AMERICAN: 40 mL/min — AB (ref >60)
Glucose, Bld: 109 mg/dL — ABNORMAL HIGH (ref 65–99)
POTASSIUM: 3.6 mmol/L (ref 3.5–5.1)
Sodium: 140 mmol/L (ref 135–145)

## 2017-10-18 LAB — RETICULOCYTES
RBC.: 3.9 MIL/uL (ref 3.87–5.11)
RETIC COUNT ABSOLUTE: 46.8 10*3/uL (ref 19.0–186.0)
Retic Ct Pct: 1.2 % (ref 0.4–3.1)

## 2017-10-18 LAB — ECHOCARDIOGRAM COMPLETE
Height: 60 in
WEIGHTICAEL: 3376 [oz_av]

## 2017-10-18 LAB — TROPONIN I
Troponin I: <0.03 ng/mL (ref <0.03)
Troponin I: <0.03 ng/mL (ref <0.03)

## 2017-10-18 LAB — IRON AND TIBC
Iron: 30 ug/dL (ref 28–170)
SATURATION RATIOS: 12 % (ref 10.4–31.8)
TIBC: 248 ug/dL — AB (ref 250–450)
UIBC: 218 ug/dL

## 2017-10-18 LAB — FOLATE: Folate: 9.9 ng/mL (ref >5.9)

## 2017-10-18 LAB — VITAMIN B12: VITAMIN B 12: 4256 pg/mL — AB (ref 180–914)

## 2017-10-18 LAB — FERRITIN: Ferritin: 120 ng/mL (ref 11–307)

## 2017-10-18 LAB — MRSA PCR SCREENING: MRSA BY PCR: NEGATIVE

## 2017-10-18 MED ORDER — POTASSIUM CHLORIDE CRYS ER 20 MEQ PO TBCR
40.0000 meq | EXTENDED_RELEASE_TABLET | Freq: Once | ORAL | Status: AC
  Administered 2017-10-18: 40 meq via ORAL
  Filled 2017-10-18: qty 2

## 2017-10-18 MED ORDER — LEVALBUTEROL HCL 0.63 MG/3ML IN NEBU: 0.6300 mg | INHALATION_SOLUTION | RESPIRATORY_TRACT | Status: DC | PRN

## 2017-10-18 MED ORDER — PNEUMOCOCCAL VAC POLYVALENT 25 MCG/0.5ML IJ INJ
0.5000 mL | INJECTION | INTRAMUSCULAR | Status: AC
  Administered 2017-10-19: 0.5 mL via INTRAMUSCULAR
  Filled 2017-10-18: qty 0.5

## 2017-10-18 MED ORDER — IPRATROPIUM BROMIDE 0.02 % IN SOLN: 0.5000 mg | RESPIRATORY_TRACT | Status: DC | PRN

## 2017-10-18 MED ORDER — TIOTROPIUM BROMIDE MONOHYDRATE 18 MCG IN CAPS
18.0000 ug | ORAL_CAPSULE | Freq: Every day | RESPIRATORY_TRACT | Status: DC
  Administered 2017-10-19 – 2017-10-20 (×2): 18 ug via RESPIRATORY_TRACT
  Filled 2017-10-18: qty 5

## 2017-10-18 MED ORDER — FUROSEMIDE 10 MG/ML IJ SOLN
40.0000 mg | Freq: Two times a day (BID) | INTRAMUSCULAR | Status: DC
  Administered 2017-10-18 – 2017-10-19 (×2): 40 mg via INTRAVENOUS
  Filled 2017-10-18 (×2): qty 4

## 2017-10-18 NOTE — Consult Note (Addendum)
Cardiology Consultation:   Patient ID: MELIZA KAGE; [id number]; [birth year]   Admit date: 10/17/2017 Date of Consult: 10/18/2017  Primary Care Provider: [name], MD Primary Cardiologist: Dr. Irish Lack   Patient Profile:   CHIDINMA CLITES is a 77 y.o. female with a hx of chronic diastolic heart failure (76/8088 with LVEF 60-65% and grade 2 DD, no wall motion abnormalities), HTN, HLD, chronic anemia secondary to GI bleeding/AVM's, chronic kidney disease stage III, COPD(home O2) and GERD who is being seen today for the evaluation of atrial fibrillation with RVR at the request of Dr. Hal Hope.   History of Present Illness:   Ms. Yaun is a 77 year old female with a history as stated above who presented to [facility] on 10/17/2017 for new onset atrial fibrillation with RVR.  It appears that the patient had a recent hospital admission on 08/08/2017 for acute on chronic CHF exacerbation.  We were not consulted during that admission.  At her follow-up appointment with her PCP, it was recommended that she follow with cardiology more closely (last follow-up with cardiology prior to this was 12/03/2011).  She was subsequently seen by Dr. Irish Lack on 09/10/2017.  At that time, her torsemide 40 mg PO QD was continued secondary to fluid volume overload and history of diastolic heart failure.  This was initially started on 08/14/2017. She was otherwise doing relatively well at this appointment.  Unortunately, she was seen once again by her PCP shortly thereafter for shingles flare.  At her follow-up shingles appointment on 10/17/2017, the patient reported worsening lower extremity edema, shortness of breath (wears home O2), palpitations and increasing weight.  An EKG was performed at her PCP office which revealed that she was in atrial fibrillation with RVR.  She then proceeded to [facility] ED for further evaluation on 10/17/2017.    Upon arrival in the emergency room, a chest x-ray was  performed which showed pleural effusion and vascular congestion.  Her BNP was 324. Troponin level negative at<0.03, <0.03.  Her TSH was within normal limits at 3.276.  An EKG was performed and revealed atrial fibrillation with RVR with a heart rate of approximately 120bpm however, per telemetry review she has had heart rates in the 140s-150s.  She was given a one-time dose of diltiazem 30 mg PO and was started on IV Lasix 40 mg BID.  Of note, she has no known history of atrial fibrillation however, reports feeling palpitations intermittently for the last 3 years.  She reports that at some point, she has worn a one-month monitor which unfortunately did not capture any arrhythmias however, I cannot find this documentation in EPIC at this time. She states that she has reported these intermittent palpitations to her PCP provider on several occasions, but subsequent EKGs have never revealed anything other than normal sinus rhythm.  During our interview, she also reports having intermittent left-sided chest pain on and off during this time however, she states that this is similar to the past sensation related to her shingles flare.  She denies and recent illnesses, N/V, diaphoresis, radiating chest discomfort, dizziness or syncope. She reports multiple GI bleeds and has subsequently received several PRBC infusions in which she is followed by her PCP.  Her last transfusion was reported a couple months ago for a hemoglobin in the 4.0 range. Therefore, she is not an anticoagulation candidate at this time.  Cardiology was consulted for further evaluation of her atrial fibrillation with RVR.   Past Medical History:  Diagnosis Date  . CHF (congestive heart failure) ([place]) 08/31/03  . Colonic polyp 02/16/2008   Tubular adenoma  . COPD (chronic obstructive pulmonary disease) ([place]) 01/27/98  . GERD (gastroesophageal reflux disease) 07/30/92  . Gout   . HTN (hypertension) 07/30/1988  . Hyperlipidemia 11/27/96  . Kidney stone  1960, 1972, 1991  . Renal insufficiency   . Ulcerative colitis     Past Surgical History:  Procedure Laterality Date  . BRONCHOSCOPY    . COLONOSCOPY WITH PROPOFOL N/A 02/23/2015   Procedure: COLONOSCOPY WITH PROPOFOL;  Surgeon: Ladene Artist, MD;  Location: [facility] ENDOSCOPY;  Service: Endoscopy;  Laterality: N/A;  . ESOPHAGOGASTRODUODENOSCOPY (EGD) WITH PROPOFOL N/A 02/23/2015   Procedure: ESOPHAGOGASTRODUODENOSCOPY (EGD) WITH PROPOFOL;  Surgeon: Ladene Artist, MD;  Location: [facility]OSCOPY;  Service: Endoscopy;  Laterality: N/A;  . NO PAST SURGERIES       Prior to Admission medications   Medication Sig Start Date End Date Taking? Authorizing Provider  acetaminophen (TYLENOL) 325 MG tablet Take 650 mg by mouth every 6 (six) hours as needed for moderate pain, fever or headache.   Yes [provider]  albuterol (PROAIR HFA) 108 (90 Base) MCG/ACT inhaler Inhale 1-2 puffs into the lungs every 6 (six) hours as needed for wheezing or shortness of breath. 10/30/16  Yes [name], MD  albuterol (PROVENTIL) (2.5 MG/3ML) 0.083% nebulizer solution Take 3 mLs (2.5 mg total) by nebulization every 6 (six) hours as needed for wheezing or shortness of breath. 07/18/17  Yes [name], MD  allopurinol (ZYLOPRIM) 100 MG tablet TAKE 1 TABLET BY MOUTH EVERY DAY FOR GOUT Patient taking differently: TAKE 100 mg TABLET BY MOUTH EVERY DAY FOR GOUT 08/18/17  Yes [name], MD  cyanocobalamin 2000 MCG tablet Take 1 tablet (2,000 mcg total) by mouth daily. Patient taking differently: Take 2,000 mcg by mouth every evening.  02/24/16  Yes [name], MD  Dietary Management Product (VASCULERA) TABS Take 1 tablet by mouth daily. 05/09/17  Yes [name], MD  ferrous sulfate 325 (65 FE) MG tablet Take 1 tablet (325 mg total) by mouth 2 (two) times daily with a meal. 02/24/16  Yes [name], MD  fluticasone furoate-vilanterol (BREO ELLIPTA) 100-25 MCG/INH AEPB Inhale 1 puff into the lungs  daily. 07/18/17  Yes [name], MD  hydrocerin (EUCERIN) CREA Apply 1 application topically as directed. Patient taking differently: Apply 1 application topically as needed.  11/11/16  Yes Sheikh, Omair Latif, DO  isosorbide-hydrALAZINE (BIDIL) 20-37.5 MG tablet Take 1 tablet by mouth 3 (three) times daily. 09/19/17  Yes [name], MD  metoprolol tartrate (LOPRESSOR) 50 MG tablet Take 1 tablet (50 mg total) by mouth 2 (two) times daily. 09/10/17 12/09/17 Yes Jettie Booze, MD  torsemide (DEMADEX) 20 MG tablet Take 2 tablets (40 mg total) by mouth daily. 08/15/17  Yes Ghimire, Henreitta Leber, MD  Vitamin D, Ergocalciferol, (DRISDOL) 50000 units CAPS capsule TAKE ONE CAPSULE BY MOUTH ONCE WEEKLY Patient taking differently: TAKE 50,000 CAPSULE BY MOUTH ONCE WEEKLY 08/15/17  Yes [name], MD  olmesartan-hydrochlorothiazide (BENICAR HCT) 40-25 MG per tablet Take 1 tablet by mouth daily. 09/25/11 10/10/11  [name], MD    Inpatient Medications: Scheduled Meds: . allopurinol  100 mg Oral Daily  . ferrous sulfate  325 mg Oral BID WC  . fluticasone furoate-vilanterol  1 puff Inhalation Daily  . furosemide  80 mg Intravenous Q12H  . isosorbide-hydrALAZINE  1 tablet Oral TID  . metoprolol tartrate  75 mg Oral BID  . potassium chloride  40 mEq Oral Once  . VASCULERA  1 tablet Oral Daily  . cyanocobalamin  2,000 mcg Oral QPM   Continuous Infusions:  PRN Meds: albuterol, ondansetron **OR** ondansetron (ZOFRAN) IV  Allergies:    Allergies  Allergen Reactions  . Celebrex [Celecoxib]     edema  . Enalapril Cough  . Lipitor [Atorvastatin]     Muscle aches  . Amlodipine Besylate     REACTION: edema  . Amoxicillin     REACTION: Diarrhea  . Codeine Sulfate     REACTION: Nausea  . Hydrocodone-Acetaminophen     REACTION: Nausea    Social History:   Social History   Socioeconomic History  . Marital status: Married    Spouse name: Not on file  . Number of children: 3    . Years of education: Not on file  . Highest education level: Not on file  Occupational History  . Occupation: Surveyor, quantity: [employer]  Social Needs  . Financial resource strain: Not on file  . Food insecurity:    Worry: Not on file    Inability: Not on file  . Transportation needs:    Medical: Not on file    Non-medical: Not on file  Tobacco Use  . Smoking status: Former Smoker    Last attempt to quit: 06/01/1996    Years since quitting: 21.3  . Smokeless tobacco: Never Used  . Tobacco comment: Quit 1998  Substance and Sexual Activity  . Alcohol use: No  . Drug use: No  . Sexual activity: Not Currently  Lifestyle  . Physical activity:    Days per week: Not on file    Minutes per session: Not on file  . Stress: Not on file  Relationships  . Social connections:    Talks on phone: Not on file    Gets together: Not on file    Attends religious service: Not on file    Active member of club or organization: Not on file    Attends meetings of clubs or organizations: Not on file    Relationship status: Not on file  . Intimate partner violence:    Fear of current or ex partner: Not on file    Emotionally abused: Not on file    Physically abused: Not on file    Forced sexual activity: Not on file  Other Topics Concern  . Not on file  Social History Narrative   Daily Caffeine Use:  4   Regular Exercise -  NO       Family History:   Family History  Problem Relation Age of Onset  . Stroke Mother   . Diabetes Mother   . Kidney cancer Mother   . Stomach cancer Mother   . Heart disease Mother   . Stroke Father   . Heart disease Brother   . Heart disease Brother   . Crohn's disease Brother   . Heart disease Sister        CATH,STENT  . Clotting disorder Sister   . Cervical cancer Sister   . Lung cancer Sister   . Heart attack Sister   . Diabetes Sister   . Colon cancer Neg Hx    Family Status:  Family Status  Relation Name Status  . Mother  Deceased at age  71  . Father  Deceased at age 83  . Brother  Deceased  . Brother  Deceased  . Brother  Deceased  . Sister  (Not Specified)  . Sister  (Not Specified)  . Sister  (Not Specified)  . MGM  Deceased  . MGF  Deceased  . PGM  Deceased  . PGF  Deceased  . Neg Hx  (Not Specified)    ROS:  Please see the history of present illness.  All other ROS reviewed and negative.     Physical Exam/Data:   Vitals:   10/18/17 0815 10/18/17 0945 10/18/17 1030 10/18/17 1137  BP: 140/62 (!) 143/50 (!) 126/52 128/68  Pulse: (!) 48 (!) 101 80 (!) 105  Resp: (!) 23 17 20  (!) 24  Temp:    98.1 F (36.7 C)  TempSrc:    Oral  SpO2: 100% 100% 100% 100%    Intake/Output Summary (Last 24 hours) at 10/18/2017 1141 Last data filed at 10/18/2017 1114 Gross per 24 hour  Intake -  Output 1200 ml  Net -1200 ml   There were no vitals filed for this visit. There is no height or weight on file to calculate BMI.   General: Well developed, well nourished, NAD Skin: Warm, dry, intact  Head: Normocephalic, atraumatic, clear, moist mucus membranes. Neck: Negative for carotid bruits. No JVD Lungs: Bilateral lower lobe crackles. No wheezes. Breathing is unlabored on nasal cannula 2 L Cardiovascular: Irregularly irregular with S1 S2.  Aortic murmur, no rubs, gallops, or LV heave appreciated. Abdomen: Soft, mild tenderness with evidence of umbilical hernia, non-distended with normoactive bowel sounds. MSK: Strength and tone appear normal for age. 5/5 in all extremities Extremities: 2+ BLE edema left> right. No clubbing or cyanosis. DP/PT pulses 1+ bilaterally Neuro: Alert and oriented. No focal deficits. No facial asymmetry. MAE spontaneously. Psych: Responds to questions appropriately with normal affect.     EKG:  The EKG was personally reviewed and demonstrates: 10/17/2017 atrial fibrillation with RVR HR 104 Telemetry:  Telemetry was personally reviewed and demonstrates: 10/18/2017 atrial fibrillation HR  104  Relevant CV Studies:  ECHO: 08/09/17 Study Conclusions  - Left ventricle: The cavity size was normal. There was severe   concentric hypertrophy. Systolic function was normal. The   estimated ejection fraction was in the range of 60% to 65%. Wall   motion was normal; there were no regional wall motion   abnormalities. Features are consistent with a pseudonormal left   ventricular filling pattern, with concomitant abnormal relaxation   and increased filling pressure (grade 2 diastolic dysfunction).   Doppler parameters are consistent with high ventricular filling   pressure. - Aortic valve: Moderate focal calcification involving the   noncoronary cusp. There was trivial regurgitation. - Mitral valve: Calcified annulus. There was mild regurgitation. - Left atrium: The atrium was mildly dilated. - Pulmonic valve: There was trivial regurgitation.  ECHO: 11/09/2016  Study Conclusions  - Left ventricle: The cavity size was normal. There was moderate   concentric hypertrophy. Systolic function was normal. The   estimated ejection fraction was in the range of 60% to 65%. Wall   motion was normal; there were no regional wall motion   abnormalities. Features are consistent with a pseudonormal left   ventricular filling pattern, with concomitant abnormal relaxation   and increased filling pressure (grade 2 diastolic dysfunction). - Mitral valve: There was mild regurgitation. - Left atrium: The atrium was mildly dilated. - Right atrium: The atrium was mildly dilated. - Pulmonary arteries: PA peak pressure: 32 mm Hg (S).  CATH: NA  Laboratory Data:  Chemistry Recent Labs  Lab 10/17/17 1351 10/18/17 0426  NA 137 140  K 4.0 3.6  CL 100* 100*  CO2 30 29  GLUCOSE 111* 109*  BUN 27* 24*  CREATININE 1.27* 1.26*  CALCIUM 8.8* 8.6*  GFRNONAA 40* 40*  GFRAA 46* 46*  ANIONGAP 7 11    Total Protein  Date Value Ref Range Status  08/08/2017 7.1 6.5 - 8.1 g/dL Final   04/11/2016 6.9 6.0 - 8.5 g/dL Final  04/11/2016 7.5 6.4 - 8.3 g/dL Final   Albumin  Date Value Ref Range Status  08/08/2017 3.0 (L) 3.5 - 5.0 g/dL Final  04/11/2016 3.0 (L) 3.5 - 5.0 g/dL Final   AST  Date Value Ref Range Status  08/08/2017 17 15 - 41 U/L Final  04/11/2016 12 5 - 34 U/L Final   ALT  Date Value Ref Range Status  08/08/2017 11 (L) 14 - 54 U/L Final  04/11/2016 12 0 - 55 U/L Final   Alkaline Phosphatase  Date Value Ref Range Status  08/08/2017 55 38 - 126 U/L Final  04/11/2016 82 40 - 150 U/L Final   Total Bilirubin  Date Value Ref Range Status  08/08/2017 0.6 0.3 - 1.2 mg/dL Final  04/11/2016 0.35 0.20 - 1.20 mg/dL Final   Hematology Recent Labs  Lab 10/17/17 1351 10/18/17 0426  WBC 8.8 6.6  RBC 3.69* 3.38*  HGB 9.8* 9.3*  HCT 32.0* 29.3*  MCV 86.7 86.7  MCH 26.6 27.5  MCHC 30.6 31.7  RDW 18.8* 18.6*  PLT 198 174   Cardiac Enzymes Recent Labs  Lab 10/17/17 2330 10/18/17 0426  TROPONINI <0.03 <0.03    Recent Labs  Lab 10/17/17 1406  TROPIPOC 0.00    BNP Recent Labs  Lab 10/17/17 2046  BNP 324.4*    DDimer No results for input(s): DDIMER in the last 168 hours. TSH:  Lab Results  Component Value Date   TSH 3.276 10/17/2017   Lipids: Lab Results  Component Value Date   CHOL 175 09/04/2016   HDL 46.10 09/04/2016   LDLCALC 93 08/17/2015   LDLDIRECT 107.0 09/04/2016   TRIG 215.0 (H) 09/04/2016   CHOLHDL 4 09/04/2016   HgbA1c: Lab Results  Component Value Date   HGBA1C 5.7 08/19/2017    Radiology/Studies:  Dg Chest 2 View  Result Date: 10/17/2017 CLINICAL DATA:  Shortness of breath. EXAM: CHEST - 2 VIEW COMPARISON:  08/10/2017. FINDINGS: Mediastinum and hilar structures normal. Stable cardiomegaly with normal pulmonary vascularity. Interval clearing of basilar pulmonary infiltrates/edema. Low lung volumes with mild basilar atelectasis. Small left pleural effusion. Biapical pleural thickening noted consistent with scarring.  No pneumothorax. Degenerative changes thoracic spine. IMPRESSION: 1. Cardiomegaly with normal pulmonary vascularity. Interval clearing of basilar pulmonary infiltrates/edema. Low lung volumes with mild basilar atelectasis. 2.  Small left pleural effusion. Electronically Signed   By: Marcello Moores  Register   On: 10/17/2017 14:23    Assessment and Plan:   1.  New onset atrial fibrillation with RVR: -Patient reports approximately a 3-year history of intermittent palpitations. Could have very well been in atrial fibrillation this whole time. Looking back at cardiology office notes does not appear that this was captured although, her follow-up has been punctuated. Last office visit 09/10/2017 however, previously was last seen 5 years prior to this.  Patient states she has previously worn monitor however I cannot find this documentation in epic. -Metoprolol 75 mg PO BID (increased from home dose of 50 mg BID). This is  not given in the emergency department. Spoke with RN who will give this now.  We will follow heart rate after this dose and adjust medications as needed from there. -One dose diltiazem 30 mg PO given in the emergency department with plans for gtt if heart rate worsens. -Echocardiogram pending -Patient not on anticoagulation candidate secondary to GI bleed/AVMs and chronic anemia -Avoid antiarryhthmics -BP stable -CHA2DS2VASc =5  2.  Acute on chronic diastolic congestive heart failure: -Per echocardiogram 11/09/2016, LVEF 60-65% with grade 2 diastolic dysfunction -Last office visit 09/17/2017 patient reported increased weight gain with recommendations to alter her diuretic therapy>> home Demadex 40 mg PO QD -Weight, last office visit weight 204lb on 09/10/2017 -I&O, net -960 mL, total output since admission 1.2 L -Decrease IV Lasix to 52m BID for now and monitor renal function closely -K+ supplementation in place, 40 mEq today>monitor -Fluid volume overloaded on exam.  Will monitor with strict  weights and I&O -BiDil 20-37.5 mg PO TID  3.  Chronic kidney disease stage III: -Stable, Cr, 1.26. Baseline appears to be in the 1.2-1.6 range -Avoid nephrotoxic medications -Will follow with BMET  4. Chronic GI bleed with chronic anemia: -Primary team -Patient is not an anticoagulation candidate  5.  History of COPD: -Per primary team -On inhalers  6.  HTN: -Stable, 128/68, 126/52, 143/50 -Metoprolol, BiDil    For questions or updates, please contact [email or web address] consult [email or web address] for contact info under Cardiology/STEMI.   Signed,Kathyrn DrownNP-C [facility] Pager: [phone]/22/2019 11:41 AM

## 2017-10-18 NOTE — ED Notes (Signed)
Attempted report x1. 

## 2017-10-18 NOTE — Progress Notes (Signed)
  Echocardiogram 2D Echocardiogram has been performed.  Bobbye Charleston 10/18/2017, 2:29 PM

## 2017-10-18 NOTE — Progress Notes (Signed)
PROGRESS NOTE    [name]  EHM:[id number] DOB: [birth year] DOA: 10/17/2017 PCP: [name], MD   Brief Narrative:  [name] is a 77 y.o. female with history of diastolic CHF, chronic blood loss anemia secondary to chronic GI bleed, history of ulcerative colitis, chronic kidney disease, COPD has been experiencing palpitations since March 9 3 weeks ago.  Also has been noticing increasing lower extremity edema shortness of breath and increasing weight and has gained at least 7 pounds over the last 1 month.  Has had chest pain off and on pressure-like.  Had gone to her PCP and was found to be in A. fib with RVR and referred to the ER.  ED Course: In the ER patient chest x-ray shows pleural effusion and congestion.  Patient was in A. fib with RVR and was given oral Cardizem p.o. 30 mg.  Lasix 40 mg IV was given for CHF.  At the time of my exam patient is not in distress but heart rate is around 120 in A. fib with RVR.  Patient is being admitted for new onset A. fib with RVR and CHF.  Patient states she has been having palpitation off and on for last 3 years.       Assessment & Plan:   Principal Problem:   Acute diastolic HF (heart failure), NYHA class 1 (HCC) Active Problems:   Gout   ULCERATIVE COLITIS-LEFT SIDE   Ulcer of leg, chronic, right (HCC)   Chronic venous stasis dermatitis of both lower extremities   OSA (obstructive sleep apnea)   Anemia, iron deficiency   Angiodysplasia of duodenum   Acute CHF (congestive heart failure) (HCC)   Atrial fibrillation with RVR (HCC)  #1 acute on chronic diastolic CHF [id number] = 4. Questionable etiology.  Could likely be secondary to A. fib with RVR.  2D echo from January 2019 with a EF of 60-65% with grade 2 diastolic dysfunction.  BNP was elevated at 324.4.  Patient with no significant improvement with shortness of breath since admission.  She was started on IV Lasix with current urine output of 2.1 L so far.  Repeat 2D  echo pending.  Serial cardiac enzymes pending.  Continue Lopressor, BiDil, IV Lasix.  Cardiology has been consulted for further evaluation and management.  Unable to anticoagulate secondary to history of chronic GI bleed.  #2 new onset atrial fibrillation Questionable etiology.  Patient noted to be A. fib on admission was given oral Cardizem and dose of metoprolol increased to 75 mg twice daily.  2D echo pending.  Continue current dose of oral Lopressor.  Cardiology has been consulted for further evaluation and management.  3.  Chronic venous stasis dermatitis Stable.  4.  Iron deficiency anemia Hemoglobin currently around baseline of 9.3.  Continue oral iron supplementation.  Follow.  5.  Obstructive sleep apnea CPAP nightly.  6.  Chronic GI bleed with chronic anemia FOBT was positive.  Follow H&H.  7.  History of gout Allopurinol.  8.  Chronic kidney disease stage III Stable.  Follow.  9.  COPD Stable.  Continue Brio Ellipta.  We will add Spiriva.  Xopenex and Atrovent nebs as needed.   DVT prophylaxis: SCDs Code Status: DNR Family Communication: Updated patient and husband at bedside. Disposition Plan: Pending hospitalization likely home once A. fib is controlled, volume overload improved and per cardiology.   Consultants:   Cardiology 10/18/2017  Procedures:   2D echo 10/18/2017 results pending  Chest x-ray 10/17/2017  Antimicrobials:   None   Subjective: Patient getting blood drawn at this time and a little bit of moderate pain.  Patient denies any chest pain no palpitations at this time.  Patient states slight improvement with shortness of breath since admission however still significantly short of breath.  Objective: Vitals:   10/18/17 1317 10/18/17 1548 10/18/17 1618 10/18/17 1648  BP: 140/67 (!) 97/54 105/60 (!) 88/76  Pulse: (!) 109     Resp:  (!) 21 (!) 26 18  Temp:      TempSrc:      SpO2:        Intake/Output Summary (Last 24 hours) at 10/18/2017  1749 Last data filed at 10/18/2017 1300 Gross per 24 hour  Intake 480 ml  Output 1200 ml  Net -720 ml   There were no vitals filed for this visit.  Examination:  General exam: Appears calm and comfortable. Respiratory system: Some coarse scattered breath sounds/crackles.  No rhonchi.  No wheezing. Respiratory effort normal. Cardiovascular system: Irregularly irregular.  2+ bilateral lower extremity edema.  Gastrointestinal system: Abdomen is nondistended, soft and nontender. No organomegaly or masses felt. Normal bowel sounds heard. Central nervous system: Alert and oriented. No focal neurological deficits. Extremities: Symmetric 5 x 5 power. Skin: No rashes, lesions or ulcers Psychiatry: Judgement and insight appear normal. Mood & affect appropriate.     Data Reviewed: I have personally reviewed following labs and imaging studies  CBC: Recent Labs  Lab 10/17/17 1351 10/18/17 0426  WBC 8.8 6.6  HGB 9.8* 9.3*  HCT 32.0* 29.3*  MCV 86.7 86.7  PLT 198 440   Basic Metabolic Panel: Recent Labs  Lab 10/17/17 1351 10/17/17 2046 10/18/17 0426  NA 137  --  140  K 4.0  --  3.6  CL 100*  --  100*  CO2 30  --  29  GLUCOSE 111*  --  109*  BUN 27*  --  24*  CREATININE 1.27*  --  1.26*  CALCIUM 8.8*  --  8.6*  MG  --  2.3  --    GFR: Estimated Creatinine Clearance: 38.7 mL/min (A) (by C-G formula based on SCr of 1.26 mg/dL (H)). Liver Function Tests: No results for input(s): AST, ALT, ALKPHOS, BILITOT, PROT, ALBUMIN in the last 168 hours. No results for input(s): LIPASE, AMYLASE in the last 168 hours. No results for input(s): AMMONIA in the last 168 hours. Coagulation Profile: No results for input(s): INR, PROTIME in the last 168 hours. Cardiac Enzymes: Recent Labs  Lab 10/17/17 2330 10/18/17 0426 10/18/17 1329  TROPONINI <0.03 <0.03 <0.03   BNP (last 3 results) Recent Labs    08/08/17 1622  PROBNP 304.0*   HbA1C: No results for input(s): HGBA1C in the last  72 hours. CBG: No results for input(s): GLUCAP in the last 168 hours. Lipid Profile: No results for input(s): CHOL, HDL, LDLCALC, TRIG, CHOLHDL, LDLDIRECT in the last 72 hours. Thyroid Function Tests: Recent Labs    10/17/17 2046  TSH 3.276  FREET4 1.13*   Anemia Panel: Recent Labs    10/17/17 2046 10/18/17 1329  VITAMINB12 4,256*  --   FOLATE 9.9  --   FERRITIN 120  --   TIBC 248*  --   IRON 30  --   RETICCTPCT  --  1.2   Sepsis Labs: No results for input(s): PROCALCITON, LATICACIDVEN in the last 168 hours.  Recent Results (from the past 240 hour(s))  MRSA PCR Screening     Status:  None   Collection Time: 10/18/17 11:34 AM  Result Value Ref Range Status   MRSA by PCR NEGATIVE NEGATIVE Final    Comment:        The GeneXpert MRSA Assay (FDA approved for NASAL specimens only), is one component of a comprehensive MRSA colonization surveillance program. It is not intended to diagnose MRSA infection nor to guide or monitor treatment for MRSA infections. Performed at [facility], [place] [address], [place], [place] [address]          Radiology Studies: Dg Chest 2 View  Result Date: 10/17/2017 CLINICAL DATA:  Shortness of breath. EXAM: CHEST - 2 VIEW COMPARISON:  08/10/2017. FINDINGS: Mediastinum and hilar structures normal. Stable cardiomegaly with normal pulmonary vascularity. Interval clearing of basilar pulmonary infiltrates/edema. Low lung volumes with mild basilar atelectasis. Small left pleural effusion. Biapical pleural thickening noted consistent with scarring. No pneumothorax. Degenerative changes thoracic spine. IMPRESSION: 1. Cardiomegaly with normal pulmonary vascularity. Interval clearing of basilar pulmonary infiltrates/edema. Low lung volumes with mild basilar atelectasis. 2.  Small left pleural effusion. Electronically Signed   By: Marcello Moores  Register   On: 10/17/2017 14:23        Scheduled Meds: . allopurinol  100 mg Oral Daily  . ferrous  sulfate  325 mg Oral BID WC  . fluticasone furoate-vilanterol  1 puff Inhalation Daily  . furosemide  40 mg Intravenous BID  . isosorbide-hydrALAZINE  1 tablet Oral TID  . metoprolol tartrate  75 mg Oral BID  . cyanocobalamin  2,000 mcg Oral QPM   Continuous Infusions:   LOS: 1 day    Time spent: 35 minutes    Irine Seal, MD [facility] Pager [phone]  If 7PM-7AM, please contact night-coverage [email or web address] Password [facility] 10/18/2017, 5:49 PM

## 2017-10-18 NOTE — Progress Notes (Signed)
PHARMACIST - PHYSICIAN ORDER COMMUNICATION  CONCERNING: P&T Medication Policy on Herbal Medications  DESCRIPTION:  This patient's order for:  [name]  has been noted.  This product(s) is classified as a "medical food", though it is available by prescription. FDA classifies medical foods "generally recognized as safe".   ACTION TAKEN:. The patient is aware that we cannot provide this medication, and she is agreeable to skipping this product while inpatient, as done with prior admission. Discontinued from inpatient medication list.   Kelvin Cellar, [place] Pager: [phone] 10/18/2017 12:03 PM

## 2017-10-19 DIAGNOSIS — D509 Iron deficiency anemia, unspecified: Secondary | ICD-10-CM

## 2017-10-19 DIAGNOSIS — I5033 Acute on chronic diastolic (congestive) heart failure: Secondary | ICD-10-CM

## 2017-10-19 DIAGNOSIS — M109 Gout, unspecified: Secondary | ICD-10-CM

## 2017-10-19 DIAGNOSIS — Z8719 Personal history of other diseases of the digestive system: Secondary | ICD-10-CM

## 2017-10-19 DIAGNOSIS — I10 Essential (primary) hypertension: Secondary | ICD-10-CM

## 2017-10-19 LAB — CBC WITH DIFFERENTIAL/PLATELET
BASOS ABS: 0 10*3/uL (ref 0.0–0.1)
BASOS PCT: 0 %
Eosinophils Absolute: 0.3 10*3/uL (ref 0.0–0.7)
Eosinophils Relative: 3 %
HEMATOCRIT: 29.5 % — AB (ref 36.0–46.0)
HEMOGLOBIN: 9.2 g/dL — AB (ref 12.0–15.0)
Lymphocytes Relative: 14 %
Lymphs Abs: 1.1 10*3/uL (ref 0.7–4.0)
MCH: 27 pg (ref 26.0–34.0)
MCHC: 31.2 g/dL (ref 30.0–36.0)
MCV: 86.5 fL (ref 78.0–100.0)
Monocytes Absolute: 1.1 10*3/uL — ABNORMAL HIGH (ref 0.1–1.0)
Monocytes Relative: 14 %
NEUTROS ABS: 5.8 10*3/uL (ref 1.7–7.7)
NEUTROS PCT: 69 %
Platelets: 204 10*3/uL (ref 150–400)
RBC: 3.41 MIL/uL — AB (ref 3.87–5.11)
RDW: 18.5 % — ABNORMAL HIGH (ref 11.5–15.5)
WBC: 8.3 10*3/uL (ref 4.0–10.5)

## 2017-10-19 LAB — BASIC METABOLIC PANEL
ANION GAP: 9 (ref 5–15)
BUN: 30 mg/dL — ABNORMAL HIGH (ref 6–20)
CALCIUM: 9.1 mg/dL (ref 8.9–10.3)
CHLORIDE: 97 mmol/L — AB (ref 101–111)
CO2: 29 mmol/L (ref 22–32)
Creatinine, Ser: 1.57 mg/dL — ABNORMAL HIGH (ref 0.44–1.00)
GFR calc non Af Amer: 31 mL/min — ABNORMAL LOW (ref >60)
GFR, EST AFRICAN AMERICAN: 36 mL/min — AB (ref >60)
Glucose, Bld: 115 mg/dL — ABNORMAL HIGH (ref 65–99)
Potassium: 4.3 mmol/L (ref 3.5–5.1)
Sodium: 135 mmol/L (ref 135–145)

## 2017-10-19 LAB — MAGNESIUM: Magnesium: 2.2 mg/dL (ref 1.7–2.4)

## 2017-10-19 MED ORDER — METHYLPREDNISOLONE SODIUM SUCC 125 MG IJ SOLR
60.0000 mg | Freq: Once | INTRAMUSCULAR | Status: AC
  Administered 2017-10-19: 60 mg via INTRAVENOUS
  Filled 2017-10-19: qty 2

## 2017-10-19 MED ORDER — ACETAMINOPHEN 325 MG PO TABS: 650.0000 mg | ORAL_TABLET | ORAL | Status: DC | PRN

## 2017-10-19 MED ORDER — TORSEMIDE 20 MG PO TABS
40.0000 mg | ORAL_TABLET | Freq: Every day | ORAL | Status: DC
  Administered 2017-10-20: 40 mg via ORAL
  Filled 2017-10-19 (×2): qty 2

## 2017-10-19 MED ORDER — PREDNISONE 20 MG PO TABS
40.0000 mg | ORAL_TABLET | Freq: Every day | ORAL | Status: DC
  Administered 2017-10-20: 40 mg via ORAL
  Filled 2017-10-19: qty 2

## 2017-10-19 NOTE — Progress Notes (Addendum)
PROGRESS NOTE    [name]  FAO:[id number] DOB: July 07, 1941 DOA: 10/17/2017 PCP: [name], MD   Brief Narrative:  [name] is a 77 y.o. female with history of diastolic CHF, chronic blood loss anemia secondary to chronic GI bleed, history of ulcerative colitis, chronic kidney disease, COPD has been experiencing palpitations since March 9 3 weeks ago.  Also has been noticing increasing lower extremity edema shortness of breath and increasing weight and has gained at least 7 pounds over the last 1 month.  Has had chest pain off and on pressure-like.  Had gone to her PCP and was found to be in A. fib with RVR and referred to the ER.  ED Course: In the ER patient chest x-ray shows pleural effusion and congestion.  Patient was in A. fib with RVR and was given oral Cardizem p.o. 30 mg.  Lasix 40 mg IV was given for CHF.  At the time of my exam patient is not in distress but heart rate is around 120 in A. fib with RVR.  Patient is being admitted for new onset A. fib with RVR and CHF.  Patient states she has been having palpitation off and on for last 3 years.       Assessment & Plan:   Principal Problem:   Acute diastolic HF (heart failure), NYHA class 1 (HCC) Active Problems:   Gout   ULCERATIVE COLITIS-LEFT SIDE   Ulcer of leg, chronic, right (HCC)   Chronic venous stasis dermatitis of both lower extremities   OSA (obstructive sleep apnea)   Anemia, iron deficiency   Angiodysplasia of duodenum   Acute CHF (congestive heart failure) (HCC)   Atrial fibrillation with RVR (HCC)   Gout attack  #1 acute on chronic diastolic CHF  Questionable etiology.  Could likely be secondary to A. fib with RVR.  2D echo from January 2019 with a EF of 60-65% with grade 2 diastolic dysfunction.  BNP was elevated at 324.4.  Patient with improvement with shortness of breath.  Patient euvolemic on examination.  2D echo with EF of 65-70% with no wall motion abnormalities, mild mitral valvular  regurgitation.  She with a urine output of 2.475 L over the past 24 hours.  Patient is -1.9 L during this hospitalization.  Patient has been transitioned from IV Lasix to oral Demadex per cardiology.  Continue Lopressor, BiDil.  Per cardiology.   #2 new onset atrial fibrillation CHA2DS2VASC = 5 Questionable etiology.  Patient back in normal sinus rhythm.  2D echo with normal EF of 65-70%, no wall motion abnormalities, mild mitral valvular regurgitation.  Cardiac enzymes negative x3.  TSH at 3.276.  Continue current increased dose of metoprolol 75 mg twice daily.  Patient not a anticoagulation candidate due to history of recurrent GI bleed.  Outpatient follow-up with cardiology.  Cardiology following and appreciate input and recommendations.    3.  Chronic venous stasis dermatitis Stable.  4.  Iron deficiency anemia Hemoglobin currently around baseline of 9.3.  Continue oral iron supplementation.  Follow.  5.  Obstructive sleep apnea CPAP nightly.  6.  Chronic GI bleed with chronic anemia FOBT was positive.  H&H stable.    7.  History of gout/acute gout flare Discontinue allopurinol for now.  May be secondary to diuretics.  Patient had presented with volume overload and as such we will hold off on treating with NSAIDs.  Will give Solu-Medrol 60 mg IV x1, then prednisone 40 mg daily times 4 days.  PT/ OT to reassess tomorrow.    8.  Chronic kidney disease stage III Stable.  Follow.  9.  COPD Stable.  Continue Brio Ellipta, Spiriva, nebs as needed.  10.  Hypertension Continue BiDil and Lopressor.   DVT prophylaxis: SCDs Code Status: DNR Family Communication: Updated patient.  No family at bedside. Disposition Plan: Home once able to ambulate with improvement in acute gout and continued improvement with A. fib and volume overload.  Hopefully the next 24-48 hours.    Consultants:   Cardiology 10/18/2017  Procedures:   2D echo 10/18/2017   Chest x-ray 10/17/2017  Antimicrobials:     None   Subjective: Patient states shortness of breath has improved.  Patient denies any chest pain.  Patient denies any palpitations.  Patient complaining of bilateral ankle pain.    Objective: Vitals:   10/19/17 0450 10/19/17 0750 10/19/17 1007 10/19/17 1210  BP: (!) 139/43 (!) 138/49 (!) 142/49 (!) 130/54  Pulse: 67 66 64 60  Resp: 17 (!) 21    Temp: 98.8 F (37.1 C) 98.8 F (37.1 C)  98.1 F (36.7 C)  TempSrc: Oral Oral  Oral  SpO2: 100% 100%  100%  Weight: 95.3 kg (210 lb 3.2 oz)     Height:        Intake/Output Summary (Last 24 hours) at 10/19/2017 1510 Last data filed at 10/19/2017 0800 Gross per 24 hour  Intake 660 ml  Output 1325 ml  Net -665 ml   Filed Weights   10/18/17 2300 10/19/17 0450  Weight: 95.7 kg (211 lb) 95.3 kg (210 lb 3.2 oz)    Examination:  General exam: Appears calm and comfortable. Respiratory system: Lungs clear to auscultation bilaterally anterior lung fields.  No rhonchi.  No wheezing. Respiratory effort normal. Cardiovascular system: Regular rate and rhythm no murmurs rubs or gallops.  Trace to 1+ bilateral lower extremity edema.  Chronic venous stasis changes noted. Gastrointestinal system: Abdomen is soft, nontender, nondistended, positive bowel sounds.  No rebound.  No guarding.   Central nervous system: Alert and oriented. No focal neurological deficits. Extremities: Bilateral ankles exquisitely tender to palpation. Skin: Chronic venous stasis changes noted bilateral lower extremities.  No rashes, lesions or ulcers Psychiatry: Judgement and insight appear normal. Mood & affect appropriate.     Data Reviewed: I have personally reviewed following labs and imaging studies  CBC: Recent Labs  Lab 10/17/17 1351 10/18/17 0426 10/19/17 0442  WBC 8.8 6.6 8.3  NEUTROABS  --   --  5.8  HGB 9.8* 9.3* 9.2*  HCT 32.0* 29.3* 29.5*  MCV 86.7 86.7 86.5  PLT 198 174 469   Basic Metabolic Panel: Recent Labs  Lab 10/17/17 1351  10/17/17 2046 10/18/17 0426 10/19/17 0442  NA 137  --  140 135  K 4.0  --  3.6 4.3  CL 100*  --  100* 97*  CO2 30  --  29 29  GLUCOSE 111*  --  109* 115*  BUN 27*  --  24* 30*  CREATININE 1.27*  --  1.26* 1.57*  CALCIUM 8.8*  --  8.6* 9.1  MG  --  2.3  --  2.2   GFR: Estimated Creatinine Clearance: 31 mL/min (A) (by C-G formula based on SCr of 1.57 mg/dL (H)). Liver Function Tests: No results for input(s): AST, ALT, ALKPHOS, BILITOT, PROT, ALBUMIN in the last 168 hours. No results for input(s): LIPASE, AMYLASE in the last 168 hours. No results for input(s): AMMONIA in the last 168  hours. Coagulation Profile: No results for input(s): INR, PROTIME in the last 168 hours. Cardiac Enzymes: Recent Labs  Lab 10/17/17 2330 10/18/17 0426 10/18/17 1329  TROPONINI <0.03 <0.03 <0.03   BNP (last 3 results) Recent Labs    08/08/17 1622  PROBNP 304.0*   HbA1C: No results for input(s): HGBA1C in the last 72 hours. CBG: No results for input(s): GLUCAP in the last 168 hours. Lipid Profile: No results for input(s): CHOL, HDL, LDLCALC, TRIG, CHOLHDL, LDLDIRECT in the last 72 hours. Thyroid Function Tests: Recent Labs    10/17/17 2046  TSH 3.276  FREET4 1.13*   Anemia Panel: Recent Labs    10/17/17 2046 10/18/17 1329  VITAMINB12 4,256*  --   FOLATE 9.9  --   FERRITIN 120  --   TIBC 248*  --   IRON 30  --   RETICCTPCT  --  1.2   Sepsis Labs: No results for input(s): PROCALCITON, LATICACIDVEN in the last 168 hours.  Recent Results (from the past 240 hour(s))  MRSA PCR Screening     Status: None   Collection Time: 10/18/17 11:34 AM  Result Value Ref Range Status   MRSA by PCR NEGATIVE NEGATIVE Final    Comment:        The GeneXpert MRSA Assay (FDA approved for NASAL specimens only), is one component of a comprehensive MRSA colonization surveillance program. It is not intended to diagnose MRSA infection nor to guide or monitor treatment for MRSA  infections. Performed at [facility], [place] [address], [place], [place] [address]          Radiology Studies: No results found.      Scheduled Meds: . allopurinol  100 mg Oral Daily  . ferrous sulfate  325 mg Oral BID WC  . fluticasone furoate-vilanterol  1 puff Inhalation Daily  . isosorbide-hydrALAZINE  1 tablet Oral TID  . methylPREDNISolone (SOLU-MEDROL) injection  60 mg Intravenous Once  . metoprolol tartrate  75 mg Oral BID  . [START ON 10/20/2017] predniSONE  40 mg Oral QAC breakfast  . tiotropium  18 mcg Inhalation Daily  . torsemide  40 mg Oral Daily  . cyanocobalamin  2,000 mcg Oral QPM   Continuous Infusions:   LOS: 2 days    Time spent: 40 minutes    Irine Seal, MD [facility] Pager [phone]  If 7PM-7AM, please contact night-coverage [email or web address] Password [email or web address] 10/19/2017, 3:10 PM

## 2017-10-19 NOTE — Evaluation (Signed)
Physical Therapy Evaluation Patient Details Name: [name] MRN: [id number] DOB: 1941/06/02 Today's Date: 10/19/2017   History of Present Illness  Pt adm with acute on chronic heart failure and new onset afib. Pt developed bil foot pain thought to be gout. PMH - chf, copd with O2 dependence, ckd, gout, venous stasis  Clinical Impression  Pt presents to PT with severely limited mobility due to bilateral foot pain which is likely gout. MD is starting pt on meds for gout flare. If pt's foot pain is reduced pt should be able to mobilize adequately to return home with family. Pt adamantly refusing SNF.     Follow Up Recommendations Home health PT;Supervision for mobility/OOB(if foot pain improves)    Equipment Recommendations  None recommended by PT    Recommendations for Other Services       Precautions / Restrictions Precautions Precautions: Fall Restrictions Weight Bearing Restrictions: No      Mobility  Bed Mobility Overal bed mobility: Needs Assistance Bed Mobility: Supine to Sit;Sit to Supine     Supine to sit: Min guard;HOB elevated Sit to supine: Min assist   General bed mobility comments: Assist to bring legs back up into bed  Transfers                 General transfer comment: Pt unable to put weight on feet on floor  Ambulation/Gait                Stairs            Wheelchair Mobility    Modified Rankin (Stroke Patients Only)       Balance Overall balance assessment: Needs assistance Sitting-balance support: No upper extremity supported;Feet supported Sitting balance-Leahy Scale: Good                                       Pertinent Vitals/Pain Pain Assessment: Faces Faces Pain Scale: Hurts worst Pain Location: bil feet rt>lt Pain Descriptors / Indicators: Grimacing;Crying Pain Intervention(s): Limited activity within patient's tolerance;Monitored during session;Repositioned    Home Living Family/patient  expects to be discharged to:: Private residence Living Arrangements: Children;Spouse/significant other Available Help at Discharge: Family Type of Home: House Home Access: Stairs to enter Entrance Stairs-Rails: None Entrance Stairs-Number of Steps: 1-back. 4 -front Home Layout: One level Home Equipment: Cane - single point;Walker - 2 wheels;Bedside commode;Grab bars - tub/shower      Prior Function Level of Independence: Needs assistance   Gait / Transfers Assistance Needed: modified independent with cane           Hand Dominance   Dominant Hand: Right    Extremity/Trunk Assessment   Upper Extremity Assessment Upper Extremity Assessment: Overall WFL for tasks assessed    Lower Extremity Assessment Lower Extremity Assessment: RLE deficits/detail;LLE deficits/detail RLE Deficits / Details: limited by pain LLE Deficits / Details: limited by pain       Communication   Communication: No difficulties  Cognition Arousal/Alertness: Awake/alert Behavior During Therapy: WFL for tasks assessed/performed Overall Cognitive Status: Within Functional Limits for tasks assessed                                        General Comments      Exercises     Assessment/Plan    PT Assessment Patient needs continued  PT services  PT Problem List Decreased strength;Decreased activity tolerance;Decreased balance;Decreased mobility;Pain       PT Treatment Interventions DME instruction;Gait training;Functional mobility training;Therapeutic activities;Therapeutic exercise;Balance training;Patient/family education    PT Goals (Current goals can be found in the Care Plan section)  Acute Rehab PT Goals Patient Stated Goal: return home PT Goal Formulation: With patient Time For Goal Achievement: 10/26/17 Potential to Achieve Goals: Good    Frequency Min 3X/week   Barriers to discharge Inaccessible home environment stairs to enter    Co-evaluation                AM-PAC PT "6 Clicks" Daily Activity  Outcome Measure Difficulty turning over in bed (including adjusting bedclothes, sheets and blankets)?: A Little Difficulty moving from lying on back to sitting on the side of the bed? : Unable Difficulty sitting down on and standing up from a chair with arms (e.g., wheelchair, bedside commode, etc,.)?: Unable Help needed moving to and from a bed to chair (including a wheelchair)?: Total Help needed walking in hospital room?: Total Help needed climbing 3-5 steps with a railing? : Total 6 Click Score: 8    End of Session Equipment Utilized During Treatment: Oxygen Activity Tolerance: Patient limited by pain Patient left: in bed;with call bell/phone within reach;with nursing/sitter in room Nurse Communication: Mobility status PT Visit Diagnosis: Other abnormalities of gait and mobility (R26.89);Pain Pain - Right/Left: (bilateral) Pain - part of body: Ankle and joints of foot    Time: 1439-1500 PT Time Calculation (min) (ACUTE ONLY): 21 min   Charges:   PT Evaluation $PT Eval Moderate Complexity: 1 Mod     PT G CodesMarland Kitchen        [facility] [place] 10/19/2017, 4:37 PM

## 2017-10-19 NOTE — Progress Notes (Signed)
Progress Note  Patient Name: [name] Date of Encounter: 10/19/2017  Primary Cardiologist: Dr. Casandra Doffing  Subjective   Palpitations have resolved, no chest pain or shortness of breath at rest.  Inpatient Medications    Scheduled Meds: . allopurinol  100 mg Oral Daily  . ferrous sulfate  325 mg Oral BID WC  . fluticasone furoate-vilanterol  1 puff Inhalation Daily  . furosemide  40 mg Intravenous BID  . isosorbide-hydrALAZINE  1 tablet Oral TID  . metoprolol tartrate  75 mg Oral BID  . pneumococcal 23 valent vaccine  0.5 mL Intramuscular Tomorrow-1000  . tiotropium  18 mcg Inhalation Daily  . cyanocobalamin  2,000 mcg Oral QPM    PRN Meds: ipratropium, levalbuterol, ondansetron **OR** ondansetron (ZOFRAN) IV   Vital Signs    Vitals:   10/18/17 2300 10/19/17 0013 10/19/17 0450 10/19/17 0750  BP: (!) 147/56 (!) 129/48 (!) 139/43 (!) 138/49  Pulse: 74 65 67 66  Resp: (!) 24 18 17  (!) 21  Temp: 99.2 F (37.3 C) 100.1 F (37.8 C) 98.8 F (37.1 C) 98.8 F (37.1 C)  TempSrc: Oral Oral Oral Oral  SpO2:  99% 100% 100%  Weight: 211 lb (95.7 kg)  210 lb 3.2 oz (95.3 kg)   Height: 5' (1.524 m)       Intake/Output Summary (Last 24 hours) at 10/19/2017 0825 Last data filed at 10/19/2017 0716 Gross per 24 hour  Intake 960 ml  Output 2825 ml  Net -1865 ml   Filed Weights   10/18/17 2300 10/19/17 0450  Weight: 211 lb (95.7 kg) 210 lb 3.2 oz (95.3 kg)    Telemetry    Sinus rhythm, converted from atrial fibrillation late yesterday.  Personally reviewed.  ECG    Tracing from 10/17/2017 showed atrial fibrillation.  Personally reviewed.  Physical Exam   GEN:  Elderly woman.  No acute distress.   Neck: No JVD. Cardiac: RRR, soft systolic murmur, no gallop.  Respiratory: Nonlabored. Clear to auscultation bilaterally. GI: Soft, nontender, bowel sounds present. MS: No edema; No deformity. Neuro:  Nonfocal. Psych: Alert and oriented x 3. Normal affect.  Labs     Chemistry Recent Labs  Lab 10/17/17 1351 10/18/17 0426 10/19/17 0442  NA 137 140 135  K 4.0 3.6 4.3  CL 100* 100* 97*  CO2 30 29 29   GLUCOSE 111* 109* 115*  BUN 27* 24* 30*  CREATININE 1.27* 1.26* 1.57*  CALCIUM 8.8* 8.6* 9.1  GFRNONAA 40* 40* 31*  GFRAA 46* 46* 36*  ANIONGAP 7 11 9      Hematology Recent Labs  Lab 10/17/17 1351 10/18/17 0426 10/18/17 1329 10/19/17 0442  WBC 8.8 6.6  --  8.3  RBC 3.69* 3.38* 3.90 3.41*  HGB 9.8* 9.3*  --  9.2*  HCT 32.0* 29.3*  --  29.5*  MCV 86.7 86.7  --  86.5  MCH 26.6 27.5  --  27.0  MCHC 30.6 31.7  --  31.2  RDW 18.8* 18.6*  --  18.5*  PLT 198 174  --  204    Cardiac Enzymes Recent Labs  Lab 10/17/17 2330 10/18/17 0426 10/18/17 1329  TROPONINI <0.03 <0.03 <0.03    Recent Labs  Lab 10/17/17 1406  TROPIPOC 0.00     BNP Recent Labs  Lab 10/17/17 2046  BNP 324.4*     Radiology    Dg Chest 2 View  Result Date: 10/17/2017 CLINICAL DATA:  Shortness of breath. EXAM: CHEST - 2 VIEW  COMPARISON:  08/10/2017. FINDINGS: Mediastinum and hilar structures normal. Stable cardiomegaly with normal pulmonary vascularity. Interval clearing of basilar pulmonary infiltrates/edema. Low lung volumes with mild basilar atelectasis. Small left pleural effusion. Biapical pleural thickening noted consistent with scarring. No pneumothorax. Degenerative changes thoracic spine. IMPRESSION: 1. Cardiomegaly with normal pulmonary vascularity. Interval clearing of basilar pulmonary infiltrates/edema. Low lung volumes with mild basilar atelectasis. 2.  Small left pleural effusion. Electronically Signed   By: Marcello Moores  Register   On: 10/17/2017 14:23    Cardiac Studies   Echocardiogram 10/18/2017: Study Conclusions  - Left ventricle: The cavity size was normal. There was moderate   concentric hypertrophy. Systolic function was vigorous. The   estimated ejection fraction was in the range of 65% to 70%. Wall   motion was normal; there were no  regional wall motion   abnormalities. The study is not technically sufficient to allow   evaluation of LV diastolic function. - Aortic valve: Trileaflet; mildly thickened, mildly calcified   leaflets. - Mitral valve: There was mild regurgitation.  Patient Profile     77 y.o. female with a history of chronic diastolic heart failure, iron deficiency anemia with recurrent GI bleed and AVMs as well as ulcerative colitis, CKD stage III, and COPD, now presenting with newly documented paroxysmal atrial fibrillation with RVR.  Assessment & Plan    1.  Paroxysmal atrial fibrillation with RVR, has converted spontaneously to sinus rhythm.  Now tolerating Lopressor at 75 mg twice daily.  CHADSVASC score is 5 although she is not a good candidate for anticoagulation in light of recurring GI bleeding as previously documented (also not on aspirin as an outpatient).  2.  Acute on chronic diastolic heart failure complicated by atrial fibrillation.  Follow-up echocardiogram shows LVEF 65-70%.  Only mild mitral regurgitation.  She has been on IV Lasix with reasonable diuresis and clinically feels better.  Creatinine has bumped up from 1.2-1.5.  3.  Essential hypertension, on BiDil as well.  Pressure stable.  4.  CKD stage 3, creatinine 1.5.  No further cardiac testing is planned.  Would convert back to outpatient dose of Demadex, otherwise continue Lopressor at 75 mg twice daily for now.  She is not a good candidate for anticoagulation as previously discussed, also not on aspirin as an outpatient.  Would have her ambulate in the hall, anticipating potential discharge today with outpatient follow-up.  Depending on how much arrhythmia burden she experiences, consideration could be given to antiarrhythmic therapy at a later time.  Would schedule follow-up with Dr. Irish Lack or APP in the next 7-10 days. We will sign off.  Signed, Rozann Lesches, MD  10/19/2017, 8:25 AM

## 2017-10-20 DIAGNOSIS — Z23 Encounter for immunization: Secondary | ICD-10-CM | POA: Diagnosis not present

## 2017-10-20 LAB — BASIC METABOLIC PANEL
Anion gap: 12 (ref 5–15)
BUN: 38 mg/dL — AB (ref 6–20)
CHLORIDE: 96 mmol/L — AB (ref 101–111)
CO2: 28 mmol/L (ref 22–32)
Calcium: 9.2 mg/dL (ref 8.9–10.3)
Creatinine, Ser: 1.65 mg/dL — ABNORMAL HIGH (ref 0.44–1.00)
GFR calc Af Amer: 33 mL/min — ABNORMAL LOW (ref >60)
GFR calc non Af Amer: 29 mL/min — ABNORMAL LOW (ref >60)
Glucose, Bld: 300 mg/dL — ABNORMAL HIGH (ref 65–99)
POTASSIUM: 5 mmol/L (ref 3.5–5.1)
SODIUM: 136 mmol/L (ref 135–145)

## 2017-10-20 LAB — CBC
HEMATOCRIT: 30.1 % — AB (ref 36.0–46.0)
HEMOGLOBIN: 9.3 g/dL — AB (ref 12.0–15.0)
MCH: 26.4 pg (ref 26.0–34.0)
MCHC: 30.9 g/dL (ref 30.0–36.0)
MCV: 85.5 fL (ref 78.0–100.0)
Platelets: 217 10*3/uL (ref 150–400)
RBC: 3.52 MIL/uL — AB (ref 3.87–5.11)
RDW: 17.4 % — ABNORMAL HIGH (ref 11.5–15.5)
WBC: 8.1 10*3/uL (ref 4.0–10.5)

## 2017-10-20 LAB — MAGNESIUM: Magnesium: 2.6 mg/dL — ABNORMAL HIGH (ref 1.7–2.4)

## 2017-10-20 MED ORDER — PREDNISONE 20 MG PO TABS: 40.0000 mg | ORAL_TABLET | Freq: Every day | ORAL | 0 refills | Status: AC

## 2017-10-20 MED ORDER — METOPROLOL TARTRATE 75 MG PO TABS: 75.0000 mg | ORAL_TABLET | Freq: Two times a day (BID) | ORAL | 0 refills | Status: DC

## 2017-10-20 NOTE — Discharge Instructions (Signed)
Heart Failure Heart failure means your heart has trouble pumping blood. This makes it hard for your body to work well. Heart failure is usually a long-term (chronic) condition. You must take good care of yourself and follow your doctor's treatment plan. Follow these instructions at home:  Take your heart medicine as told by your doctor. ? Do not stop taking medicine unless your doctor tells you to. ? Do not skip any dose of medicine. ? Refill your medicines before they run out. ? Take other medicines only as told by your doctor or pharmacist.  Stay active if told by your doctor. The elderly and people with severe heart failure should talk with a doctor about physical activity.  Eat heart-healthy foods. Choose foods that are without trans fat and are low in saturated fat, cholesterol, and salt (sodium). This includes fresh or frozen fruits and vegetables, fish, lean meats, fat-free or low-fat dairy foods, whole grains, and high-fiber foods. Lentils and dried peas and beans (legumes) are also good choices.  Limit salt if told by your doctor.  Cook in a healthy way. Roast, grill, broil, bake, poach, steam, or stir-fry foods.  Limit fluids as told by your doctor.  Weigh yourself every morning. Do this after you pee (urinate) and before you eat breakfast. Write down your weight to give to your doctor.  Take your blood pressure and write it down if your doctor tells you to.  Ask your doctor how to check your pulse. Check your pulse as told.  Lose weight if told by your doctor.  Stop smoking or chewing tobacco. Do not use gum or patches that help you quit without your doctor's approval.  Schedule and go to doctor visits as told.  Nonpregnant women should have no more than 1 drink a day. Men should have no more than 2 drinks a day. Talk to your doctor about drinking alcohol.  Stop illegal drug use.  Stay current with shots (immunizations).  Manage your health conditions as told by your  doctor.  Learn to manage your stress.  Rest when you are tired.  If it is really hot outside: ? Avoid intense activities. ? Use air conditioning or fans, or get in a cooler place. ? Avoid caffeine and alcohol. ? Wear loose-fitting, lightweight, and light-colored clothing.  If it is really cold outside: ? Avoid intense activities. ? Layer your clothing. ? Wear mittens or gloves, a hat, and a scarf when going outside. ? Avoid alcohol.  Learn about heart failure and get support as needed.  Get help to maintain or improve your quality of life and your ability to care for yourself as needed. Contact a doctor if:  You gain weight quickly.  You are more short of breath than usual.  You cannot do your normal activities.  You tire easily.  You cough more than normal, especially with activity.  You have any or more puffiness (swelling) in areas such as your hands, feet, ankles, or belly (abdomen).  You cannot sleep because it is hard to breathe.  You feel like your heart is beating fast (palpitations).  You get dizzy or light-headed when you stand up. Get help right away if:  You have trouble breathing.  There is a change in mental status, such as becoming less alert or not being able to focus.  You have chest pain or discomfort.  You faint. This information is not intended to replace advice given to you by your health care provider. Make sure you  discuss any questions you have with your health care provider. Document Released: 04/24/2008 Document Revised: 12/22/2015 Document Reviewed: 09/01/2012 Elsevier Interactive Patient Education  2017 [place].    Gout Gout is painful swelling that can happen in some of your joints. Gout is a type of arthritis. This condition is caused by having too much uric acid in your body. Uric acid is a chemical that is made when your body breaks down substances called purines. If your body has too much uric acid, sharp crystals can form  and build up in your joints. This causes pain and swelling. Gout attacks can happen quickly and be very painful (acute gout). Over time, the attacks can affect more joints and happen more often (chronic gout). Follow these instructions at home: During a Gout Attack  If directed, put ice on the painful area: ? Put ice in a plastic bag. ? Place a towel between your skin and the bag. ? Leave the ice on for 20 minutes, 2-3 times a day.  Rest the joint as much as possible. If the joint is in your leg, you may be given crutches to use.  Raise (elevate) the painful joint above the level of your heart as often as you can.  Drink enough fluids to keep your pee (urine) clear or pale yellow.  Take over-the-counter and prescription medicines only as told by your doctor.  Do not drive or use heavy machinery while taking prescription pain medicine.  Follow instructions from your doctor about what you can or cannot eat and drink.  Return to your normal activities as told by your doctor. Ask your doctor what activities are safe for you. Avoiding Future Gout Attacks  Follow a low-purine diet as told by a specialist (dietitian) or your doctor. Avoid foods and drinks that have a lot of purines, such as: ? Liver. ? Kidney. ? Anchovies. ? Asparagus. ? Herring. ? Mushrooms ? Mussels. ? Beer.  Limit alcohol intake to no more than 1 drink a day for nonpregnant women and 2 drinks a day for men. One drink equals 12 oz of beer, 5 oz of wine, or 1 oz of hard liquor.  Stay at a healthy weight or lose weight if you are overweight. If you want to lose weight, talk with your doctor. It is important that you do not lose weight too fast.  Start or continue an exercise plan as told by your doctor.  Drink enough fluids to keep your pee clear or pale yellow.  Take over-the-counter and prescription medicines only as told by your doctor.  Keep all follow-up visits as told by your doctor. This is  important. Contact a doctor if:  You have another gout attack.  You still have symptoms of a gout attack after10 days of treatment.  You have problems (side effects) because of your medicines.  You have chills or a fever.  You have burning pain when you pee (urinate).  You have pain in your lower back or belly. Get help right away if:  You have very bad pain.  Your pain cannot be controlled.  You cannot pee. This information is not intended to replace advice given to you by your health care provider. Make sure you discuss any questions you have with your health care provider. Document Released: 04/24/2008 Document Revised: 12/22/2015 Document Reviewed: 04/28/2015 Elsevier Interactive Patient Education  2018 [employer].   Prednisone tablets What is this medicine? PREDNISONE (PRED ni sone) is a corticosteroid. It is commonly used  to treat inflammation of the skin, joints, lungs, and other organs. Common conditions treated include asthma, allergies, and arthritis. It is also used for other conditions, such as blood disorders and diseases of the adrenal glands. This medicine may be used for other purposes; ask your health care provider or pharmacist if you have questions. COMMON BRAND NAME(S): Deltasone, Predone, Sterapred, Sterapred DS What should I tell my health care provider before I take this medicine? They need to know if you have any of these conditions: -Cushing's syndrome -diabetes -glaucoma -heart disease -high blood pressure -infection (especially a virus infection such as chickenpox, cold sores, or herpes) -kidney disease -liver disease -mental illness -myasthenia gravis -osteoporosis -seizures -stomach or intestine problems -thyroid disease -an unusual or allergic reaction to lactose, prednisone, other medicines, foods, dyes, or preservatives -pregnant or trying to get pregnant -breast-feeding How should I use this medicine? Take this medicine by mouth  with a glass of water. Follow the directions on the prescription label. Take this medicine with food. If you are taking this medicine once a day, take it in the morning. Do not take more medicine than you are told to take. Do not suddenly stop taking your medicine because you may develop a severe reaction. Your doctor will tell you how much medicine to take. If your doctor wants you to stop the medicine, the dose may be slowly lowered over time to avoid any side effects. Talk to your pediatrician regarding the use of this medicine in children. Special care may be needed. Overdosage: If you think you have taken too much of this medicine contact a poison control center or emergency room at once. NOTE: This medicine is only for you. Do not share this medicine with others. What if I miss a dose? If you miss a dose, take it as soon as you can. If it is almost time for your next dose, talk to your doctor or health care professional. You may need to miss a dose or take an extra dose. Do not take double or extra doses without advice. What may interact with this medicine? Do not take this medicine with any of the following medications: -metyrapone -mifepristone This medicine may also interact with the following medications: -aminoglutethimide -amphotericin B -aspirin and aspirin-like medicines -barbiturates -certain medicines for diabetes, like glipizide or glyburide -cholestyramine -cholinesterase inhibitors -cyclosporine -digoxin -diuretics -ephedrine -female hormones, like estrogens and birth control pills -isoniazid -ketoconazole -NSAIDS, medicines for pain and inflammation, like ibuprofen or naproxen -phenytoin -rifampin -toxoids -vaccines -warfarin This list may not describe all possible interactions. Give your health care provider a list of all the medicines, herbs, non-prescription drugs, or dietary supplements you use. Also tell them if you smoke, drink alcohol, or use illegal drugs.  Some items may interact with your medicine. What should I watch for while using this medicine? Visit your doctor or health care professional for regular checks on your progress. If you are taking this medicine over a prolonged period, carry an identification card with your name and address, the type and dose of your medicine, and your doctor's name and address. This medicine may increase your risk of getting an infection. Tell your doctor or health care professional if you are around anyone with measles or chickenpox, or if you develop sores or blisters that do not heal properly. If you are going to have surgery, tell your doctor or health care professional that you have taken this medicine within the last twelve months. Ask your doctor or health care  professional about your diet. You may need to lower the amount of salt you eat. This medicine may affect blood sugar levels. If you have diabetes, check with your doctor or health care professional before you change your diet or the dose of your diabetic medicine. What side effects may I notice from receiving this medicine? Side effects that you should report to your doctor or health care professional as soon as possible: -allergic reactions like skin rash, itching or hives, swelling of the face, lips, or tongue -changes in emotions or moods -changes in vision -depressed mood -eye pain -fever or chills, cough, sore throat, pain or difficulty passing urine -increased thirst -swelling of ankles, feet Side effects that usually do not require medical attention (report to your doctor or health care professional if they continue or are bothersome): -confusion, excitement, restlessness -headache -nausea, vomiting -skin problems, acne, thin and shiny skin -trouble sleeping -weight gain This list may not describe all possible side effects. Call your doctor for medical advice about side effects. You may report side effects to FDA at 1-800-FDA-1088. Where  should I keep my medicine? Keep out of the reach of children. Store at room temperature between 15 and 30 degrees C (59 and 86 degrees F). Protect from light. Keep container tightly closed. Throw away any unused medicine after the expiration date. NOTE: This sheet is a summary. It may not cover all possible information. If you have questions about this medicine, talk to your doctor, pharmacist, or health care provider.  2018 Elsevier/Gold Standard (2011-03-01 10:57:14)  Metoprolol tablets What is this medicine? METOPROLOL (me TOE proe lole) is a beta-blocker. Beta-blockers reduce the workload on the heart and help it to beat more regularly. This medicine is used to treat high blood pressure and to prevent chest pain. It is also used to after a heart attack and to prevent an additional heart attack from occurring. This medicine may be used for other purposes; ask your health care provider or pharmacist if you have questions. COMMON BRAND NAME(S): Lopressor What should I tell my health care provider before I take this medicine? They need to know if you have any of these conditions: -diabetes -heart or vessel disease like slow heart rate, worsening heart failure, heart block, sick sinus syndrome or Raynaud's disease -kidney disease -liver disease -lung or breathing disease, like asthma or emphysema -pheochromocytoma -thyroid disease -an unusual or allergic reaction to metoprolol, other beta-blockers, medicines, foods, dyes, or preservatives -pregnant or trying to get pregnant -breast-feeding How should I use this medicine? Take this medicine by mouth with a drink of water. Follow the directions on the prescription label. Take this medicine immediately after meals. Take your doses at regular intervals. Do not take more medicine than directed. Do not stop taking this medicine suddenly. This could lead to serious heart-related effects. Talk to your pediatrician regarding the use of this medicine  in children. Special care may be needed. Overdosage: If you think you have taken too much of this medicine contact a poison control center or emergency room at once. NOTE: This medicine is only for you. Do not share this medicine with others. What if I miss a dose? If you miss a dose, take it as soon as you can. If it is almost time for your next dose, take only that dose. Do not take double or extra doses. What may interact with this medicine? This medicine may interact with the following medications: -certain medicines for blood pressure, heart disease, irregular heart beat -  certain medicines for depression like monoamine oxidase (MAO) inhibitors, fluoxetine, or paroxetine -clonidine -dobutamine -epinephrine -isoproterenol -reserpine This list may not describe all possible interactions. Give your health care provider a list of all the medicines, herbs, non-prescription drugs, or dietary supplements you use. Also tell them if you smoke, drink alcohol, or use illegal drugs. Some items may interact with your medicine. What should I watch for while using this medicine? Visit your doctor or health care professional for regular check ups. Contact your doctor right away if your symptoms worsen. Check your blood pressure and pulse rate regularly. Ask your health care professional what your blood pressure and pulse rate should be, and when you should contact them. You may get drowsy or dizzy. Do not drive, use machinery, or do anything that needs mental alertness until you know how this medicine affects you. Do not sit or stand up quickly, especially if you are an older patient. This reduces the risk of dizzy or fainting spells. Contact your doctor if these symptoms continue. Alcohol may interfere with the effect of this medicine. Avoid alcoholic drinks. What side effects may I notice from receiving this medicine? Side effects that you should report to your doctor or health care professional as soon as  possible: -allergic reactions like skin rash, itching or hives -cold or numb hands or feet -depression -difficulty breathing -faint -fever with sore throat -irregular heartbeat, chest pain -rapid weight gain -swollen legs or ankles Side effects that usually do not require medical attention (report to your doctor or health care professional if they continue or are bothersome): -anxiety or nervousness -change in sex drive or performance -dry skin -headache -nightmares or trouble sleeping -short term memory loss -stomach upset or diarrhea -unusually tired This list may not describe all possible side effects. Call your doctor for medical advice about side effects. You may report side effects to FDA at 1-800-FDA-1088. Where should I keep my medicine? Keep out of the reach of children. Store at room temperature between 15 and 30 degrees C (59 and 86 degrees F). Throw away any unused medicine after the expiration date. NOTE: This sheet is a summary. It may not cover all possible information. If you have questions about this medicine, talk to your doctor, pharmacist, or health care provider.  2018 Elsevier/Gold Standard (2013-03-20 14:40:36)

## 2017-10-20 NOTE — Discharge Summary (Signed)
Physician Discharge Summary  [name] PPI:[id number] DOB: [birth year] DOA: 10/17/2017  PCP: Laurie Lima, MD  Admit date: 10/17/2017 Discharge date: 10/20/2017  Time spent: 60 minutes  Recommendations for Outpatient Follow-up:  1. Follow-up with Dr. Irish Lack, cardiology in 7-10 days.  Follow-up on patient's new onset paroxysmal atrial fibrillation and acute on chronic CHF exacerbation.  Patient will need a basic metabolic profile done oh follow-up follow-up on electrolytes and renal function. 2. Follow-up with Laurie Lima, MD in 2 weeks.   Discharge Diagnoses:  Principal Problem:   Acute diastolic HF (heart failure), NYHA class 1 (HCC) Active Problems:   Gout   ULCERATIVE COLITIS-LEFT SIDE   Ulcer of leg, chronic, right (HCC)   Chronic venous stasis dermatitis of both lower extremities   OSA (obstructive sleep apnea)   Anemia, iron deficiency   Angiodysplasia of duodenum   Acute CHF (congestive heart failure) (HCC)   Atrial fibrillation with RVR (HCC)   Gout attack   Discharge Condition: Stable and improved  Diet recommendation: Heart healthy  Filed Weights   10/18/17 2300 10/19/17 0450 10/20/17 0442  Weight: 95.7 kg (211 lb) 95.3 kg (210 lb 3.2 oz) 95.5 kg (210 lb 8.6 oz)    History of present illness:  Per Dr.Kakrakandy [name] is a 77 y.o. female with history of diastolic CHF, chronic blood loss anemia secondary to chronic GI bleed, history of ulcerative colitis, chronic kidney disease, COPD has been experiencing palpitations since March 9 3 weeks ago.  Also had been noticing increasing lower extremity edema shortness of breath and increasing weight and has gained at least 7 pounds over the last 1 month.  Has had chest pain off and on pressure-like.  Had gone to her PCP and was found to be in A. fib with RVR and referred to the ER.  ED Course: In the ER patient chest x-ray showed pleural effusion and congestion.  Patient was in A. fib with RVR and was  given oral Cardizem p.o. 30 mg.  Lasix 40 mg IV was given for CHF.  At the time of exam patient was not in distress but heart rate is around 120 in A. fib with RVR.  Patient is being admitted for new onset A. fib with RVR and CHF.  Patient stated she had been having palpitation off and on for last 3 years.     Hospital Course:  #1 acute on chronic diastolic CHF  Questionable etiology.  Could likely be secondary to A. fib with RVR.  2D echo from January 2019 with a EF of 60-65% with grade 2 diastolic dysfunction.  BNP was elevated at 324.4.  Patient admitted and placed on IV Lasix and diuresed well with improvement with shortness of breath.  Patient was -1.185 L during the hospitalization.  Patient improved clinically and was subsequently euvolemic on examination. 2D echo with EF of 65-70% with no wall motion abnormalities, mild mitral valvular regurgitation.    Patient was subsequently transitioned to home regimen of Demadex per cardiology.  Patient was maintained on home regimen of Lopressor, and BiDil.  Patient be discharged home in stable and improved condition and is to follow-up with cardiology in the outpatient setting.   #2 new onset paroxysmal atrial fibrillation CHA2DS2VASC = 5 Questionable etiology.    Patient was admitted with new onset A. fib.  Patient received a dose of Cardizem in the ED and home dose Lopressor increased to 75 mg twice daily.  Cardiology was consulted and patient  seen during the hospitalization by cardiology.  2D echo with normal EF of 65-70%, no wall motion abnormalities, mild mitral valvular regurgitation.  Cardiac enzymes negative x3.  TSH at 3.276.  Patient subsequently converted back into normal sinus rhythm and rate controlled on metoprolol 75 mg twice daily. Patient not a anticoagulation candidate due to history of recurrent GI bleed.  Patient improved clinically and will be discharged in stable and improved condition. Outpatient follow-up with cardiology.   3.   Chronic venous stasis dermatitis Stable.  4.  Iron deficiency anemia Hemoglobin remained stable at  baseline of 9.3.    Patient was maintained on home regimen of oral iron supplementation.  Outpatient follow-up with PCP.   5.  Obstructive sleep apnea CPAP nightly.  6.  Chronic GI bleed with chronic anemia FOBT was positive.  H&H remained stable.    7.  History of gout/acute gout flare Was diuresed during the hospitalization and post diuresis patient had complaints of bilateral ankle pain concerning for a gout flare.  Patient was given a dose of IV Solu-Medrol and subsequently was transitioned to oral prednisone.  Patient be discharged home on 4 more days of oral prednisone to completed short steroid burst.  Outpatient follow-up with PCP.  8.  Chronic kidney disease stage III Stable.   9.  COPD Remained stable. Patient was maintained on Brio Ellipta, Spiriva, nebs as needed.  10.  Hypertension Patient was maintained on BiDil and Lopressor.      Procedures:  2D echo 10/18/2017   Chest x-ray 10/17/2017      Consultations:  Cardiology 10/18/2017      Discharge Exam: Vitals:   10/20/17 1014 10/20/17 1139  BP:  (!) 126/57  Pulse: 61 60  Resp: 20 17  Temp:  97.7 F (36.5 C)  SpO2: 100% 98%    General: NAD Cardiovascular: RRR Respiratory: CTAB  Discharge Instructions   Discharge Instructions    Diet - low sodium heart healthy   Complete by:  As directed    Increase activity slowly   Complete by:  As directed      Allergies as of 10/20/2017      Reactions   Celebrex [celecoxib]    edema   Enalapril Cough   Lipitor [atorvastatin]    Muscle aches   Amlodipine Besylate    REACTION: edema   Amoxicillin    REACTION: Diarrhea   Codeine Sulfate    REACTION: Nausea   Hydrocodone-acetaminophen    REACTION: Nausea      Medication List    TAKE these medications   acetaminophen 325 MG tablet Commonly known as:  TYLENOL Take 650 mg by mouth  every 6 (six) hours as needed for moderate pain, fever or headache.   albuterol 108 (90 Base) MCG/ACT inhaler Commonly known as:  PROAIR HFA Inhale 1-2 puffs into the lungs every 6 (six) hours as needed for wheezing or shortness of breath.   albuterol (2.5 MG/3ML) 0.083% nebulizer solution Commonly known as:  PROVENTIL Take 3 mLs (2.5 mg total) by nebulization every 6 (six) hours as needed for wheezing or shortness of breath.   allopurinol 100 MG tablet Commonly known as:  ZYLOPRIM TAKE 1 TABLET BY MOUTH EVERY DAY FOR GOUT What changed:  See the new instructions.   cyanocobalamin 2000 MCG tablet Take 1 tablet (2,000 mcg total) by mouth daily. What changed:  when to take this   ferrous sulfate 325 (65 FE) MG tablet Take 1 tablet (325 mg total) by mouth  2 (two) times daily with a meal.   fluticasone furoate-vilanterol 100-25 MCG/INH Aepb Commonly known as:  BREO ELLIPTA Inhale 1 puff into the lungs daily.   hydrocerin Crea Apply 1 application topically as directed. What changed:    when to take this  reasons to take this   isosorbide-hydrALAZINE 20-37.5 MG tablet Commonly known as:  BIDIL Take 1 tablet by mouth 3 (three) times daily.   Metoprolol Tartrate 75 MG Tabs Take 75 mg by mouth 2 (two) times daily. What changed:    medication strength  how much to take   predniSONE 20 MG tablet Commonly known as:  DELTASONE Take 2 tablets (40 mg total) by mouth daily before breakfast for 4 days. Start taking on:  10/21/2017   torsemide 20 MG tablet Commonly known as:  DEMADEX Take 2 tablets (40 mg total) by mouth daily.   VASCULERA Tabs Take 1 tablet by mouth daily.   Vitamin D (Ergocalciferol) 50000 units Caps capsule Commonly known as:  DRISDOL TAKE ONE CAPSULE BY MOUTH ONCE WEEKLY What changed:    how much to take  how to take this  when to take this      Allergies  Allergen Reactions  . Celebrex [Celecoxib]     edema  . Enalapril Cough  . Lipitor  [Atorvastatin]     Muscle aches  . Amlodipine Besylate     REACTION: edema  . Amoxicillin     REACTION: Diarrhea  . Codeine Sulfate     REACTION: Nausea  . Hydrocodone-Acetaminophen     REACTION: Nausea   Follow-up Information    Jettie Booze, MD. Schedule an appointment as soon as possible for a visit in 7 day(s).   Specialties:  Cardiology, Radiology, Interventional Cardiology Why:  f/u in 7-10 days Contact information: [address] [place] [address] [phone]        Laurie Lima, MD. Schedule an appointment as soon as possible for a visit in 2 week(s).   Specialty:  Internal Medicine Contact information: [address] [place] [address] [phone]            The results of significant diagnostics from this hospitalization (including imaging, microbiology, ancillary and laboratory) are listed below for reference.    Significant Diagnostic Studies: Dg Chest 2 View  Result Date: 10/17/2017 CLINICAL DATA:  Shortness of breath. EXAM: CHEST - 2 VIEW COMPARISON:  08/10/2017. FINDINGS: Mediastinum and hilar structures normal. Stable cardiomegaly with normal pulmonary vascularity. Interval clearing of basilar pulmonary infiltrates/edema. Low lung volumes with mild basilar atelectasis. Small left pleural effusion. Biapical pleural thickening noted consistent with scarring. No pneumothorax. Degenerative changes thoracic spine. IMPRESSION: 1. Cardiomegaly with normal pulmonary vascularity. Interval clearing of basilar pulmonary infiltrates/edema. Low lung volumes with mild basilar atelectasis. 2.  Small left pleural effusion. Electronically Signed   By: Marcello Moores  Register   On: 10/17/2017 14:23    Microbiology: Recent Results (from the past 240 hour(s))  MRSA PCR Screening     Status: None   Collection Time: 10/18/17 11:34 AM  Result Value Ref Range Status   MRSA by PCR NEGATIVE NEGATIVE Final    Comment:        The  GeneXpert MRSA Assay (FDA approved for NASAL specimens only), is one component of a comprehensive MRSA colonization surveillance program. It is not intended to diagnose MRSA infection nor to guide or monitor treatment for MRSA infections. Performed at [facility], [place]  [address] [place], [place] [address]      Labs: Basic Metabolic Panel: Recent Labs  Lab 10/17/17 1351 10/17/17 2046 10/18/17 0426 10/19/17 0442 10/20/17 0407  NA 137  --  140 135 136  K 4.0  --  3.6 4.3 5.0  CL 100*  --  100* 97* 96*  CO2 30  --  29 29 28   GLUCOSE 111*  --  109* 115* 300*  BUN 27*  --  24* 30* 38*  CREATININE 1.27*  --  1.26* 1.57* 1.65*  CALCIUM 8.8*  --  8.6* 9.1 9.2  MG  --  2.3  --  2.2 2.6*   Liver Function Tests: No results for input(s): AST, ALT, ALKPHOS, BILITOT, PROT, ALBUMIN in the last 168 hours. No results for input(s): LIPASE, AMYLASE in the last 168 hours. No results for input(s): AMMONIA in the last 168 hours. CBC: Recent Labs  Lab 10/17/17 1351 10/18/17 0426 10/19/17 0442 10/20/17 0407  WBC 8.8 6.6 8.3 8.1  NEUTROABS  --   --  5.8  --   HGB 9.8* 9.3* 9.2* 9.3*  HCT 32.0* 29.3* 29.5* 30.1*  MCV 86.7 86.7 86.5 85.5  PLT 198 174 204 217   Cardiac Enzymes: Recent Labs  Lab 10/17/17 2330 10/18/17 0426 10/18/17 1329  TROPONINI <0.03 <0.03 <0.03   BNP: BNP (last 3 results) Recent Labs    11/08/16 1815 08/08/17 2051 10/17/17 2046  BNP 117.3* 268.3* 324.4*    ProBNP (last 3 results) Recent Labs    08/08/17 1622  PROBNP 304.0*    CBG: No results for input(s): GLUCAP in the last 168 hours.     Signed:  Irine Seal MD.  [facility] 10/20/2017, 3:37 PM

## 2017-10-20 NOTE — Care Management Note (Addendum)
Case Management Note Marvetta Gibbons RN, BSN Unit 4E-Case Manager--  Weekend coverage 6E [phone]  Patient Details  Name: [name] MRN: [id number] Date of Birth: Aug 13, 1940  Subjective/Objective:    Pt admitted with new onset A. fib with RVR and CHF                 Action/Plan: PTA pt lived at home with spouse/family- per PT eval recommendation for [facility] services, orders have been placed- spoke with pt at bedside- per conversation pt reports that she has cane, walker, BSC, shower grab bars at home- no further DME needs noted at this time. Pt also states that she has had HH with [facility] back in Jan. And she did not feel it was very beneficial- CM offered choice- however pt politely declines HH services- stating she does not want them at this time "feels like its a waste of time and money"- discussed recommendations made by PT and how pt is currently doing- pt still states she feels like she knows what to do at home and that the services will not help her. She has home 02 with [facility]- baseline 2L. - Discussed with pt that if she gets home and changes her mind about [facility] services to call her PCP for [facility] arrangements- pt verbalized understanding. Notified bedside RN of pt's choice for no services.   Expected Discharge Date:     10/20/17             Expected Discharge Plan:  [place]  In-House Referral:     Discharge planning Services  CM Consult  Post Acute Care Choice:  Home Health Choice offered to:  Patient  DME Arranged:  N/A DME Agency:  NA  HH Arranged:  RN, PT, OT, Nurse's Aide, Social Work, Patient Refused [place] Agency:  NA  Status of Service:  Completed, signed off  If discussed at [employer] of Stay Meetings, dates discussed:    Discharge Disposition: home/self care   Additional Comments:  Dawayne Patricia, RN 10/20/2017, 10:41 AM

## 2017-10-20 NOTE — Evaluation (Signed)
Occupational Therapy Evaluation and Discharge Patient Details Name: [name] MRN: [id number] DOB: October 27, 1940 Today's Date: 10/20/2017    History of Present Illness Pt adm with acute on chronic heart failure and new onset afib. Pt developed bil foot pain thought to be gout. PMH - chf, copd with O2 dependence, ckd, gout, venous stasis   Clinical Impression   PTA Pt mod I for ADL and mobility with SPC, Pt plans on using RW for increased safety and stability upon return home. Today Pt able to perform SPT to [facility] at min guard level and own peri care in standing, Pt able to don/doff socks in chair without assist. Suspect that as pain improves Pt will return to mod I, and is close to baseline despite pain at this time. Pt with no questions or concerns for OT at this time. Energy conservation education provided and handout provided. Thank you for the opportunity to serve this patient, OT to sign off at this time.     Follow Up Recommendations  Home health OT;Supervision/Assistance - 24 hour(Pt is currently declining all HH services - )    Equipment Recommendations  None recommended by OT(Pt has appropriate DME)    Recommendations for Other Services       Precautions / Restrictions Precautions Precautions: Fall Restrictions Weight Bearing Restrictions: No      Mobility Bed Mobility Overal bed mobility: Needs Assistance Bed Mobility: Supine to Sit     Supine to sit: Min guard;HOB elevated     General bed mobility comments: Pt OOB in recliner when OT entered room  Transfers Overall transfer level: Needs assistance Equipment used: None Transfers: Sit to/from [facility] Sit to Stand: Min guard Stand pivot transfers: Min guard       General transfer comment: from [facility], and recliner; min guard for safety    Balance Overall balance assessment: Needs assistance Sitting-balance support: No upper extremity supported;Feet supported Sitting balance-Leahy Scale:  Good     Standing balance support: During functional activity;Single extremity supported Standing balance-Leahy Scale: Poor Standing balance comment: requires external supports from furniture                           ADL either performed or assessed with clinical judgement   ADL Overall ADL's : At baseline                                       General ADL Comments: Pt able to perform BSC transfer min guard, set up for peri care with warm wash cloth, "I can move better because my pain is less, as the pain goes away I know what I need to do" has good home set up with DME to support mod I for ADL and cooking in kitchen     Vision Patient Visual Report: No change from baseline       Perception     Praxis      Pertinent Vitals/Pain Pain Assessment: Faces Faces Pain Scale: Hurts little more Pain Location: bil feet/ankles ( L>R) Pain Descriptors / Indicators: Aching;Guarding Pain Intervention(s): Limited activity within patient's tolerance;Monitored during session;Repositioned     Hand Dominance Right   Extremity/Trunk Assessment Upper Extremity Assessment Upper Extremity Assessment: Overall WFL for tasks assessed   Lower Extremity Assessment Lower Extremity Assessment: Defer to PT evaluation       Communication Communication Communication:  No difficulties   Cognition Arousal/Alertness: Awake/alert Behavior During Therapy: WFL for tasks assessed/performed Overall Cognitive Status: Within Functional Limits for tasks assessed                                     General Comments  Pt is currently politely declining all HH services    Exercises     Shoulder Instructions      Home Living Family/patient expects to be discharged to:: Private residence Living Arrangements: Children;Spouse/significant other(daughter has residual deficits from CVA) Available Help at Discharge: Family Type of Home: House Home Access: Stairs to  enter [employer] of Steps: 1-back. 4 -front Entrance Stairs-Rails: None Home Layout: One level     Bathroom Shower/Tub: Teacher, early years/pre: Standard Bathroom Accessibility: Yes How Accessible: Accessible via walker Home Equipment: Cane - single point;Walker - 2 wheels;Bedside commode;Grab bars - tub/shower   Additional Comments: Pt is responsible for IADL (cooking in particular) around the house as her daughter has short term memory loss due to CVA.       Prior Functioning/Environment Level of Independence: Independent with assistive device(s)  Gait / Transfers Assistance Needed: modified independent with cane     Comments: mod I for ADL and does all cooking for family - daughter helps with other chores        OT Problem List: Decreased activity tolerance;Obesity;Pain;Increased edema      OT Treatment/Interventions:      OT Goals(Current goals can be found in the care plan section) Acute Rehab OT Goals Patient Stated Goal: return home OT Goal Formulation: With patient Time For Goal Achievement: 11/03/17 Potential to Achieve Goals: Good  OT Frequency:     Barriers to D/C:            Co-evaluation              AM-PAC PT "6 Clicks" Daily Activity     Outcome Measure Help from another person eating meals?: None Help from another person taking care of personal grooming?: None(in sitting) Help from another person toileting, which includes using toliet, bedpan, or urinal?: A Little Help from another person bathing (including washing, rinsing, drying)?: A Little Help from another person to put on and taking off regular upper body clothing?: None Help from another person to put on and taking off regular lower body clothing?: A Little 6 Click Score: 21   End of Session Equipment Utilized During Treatment: Gait belt;Oxygen Nurse Communication: Mobility status  Activity Tolerance: Patient tolerated treatment well Patient left: in  chair;with call bell/phone within reach  OT Visit Diagnosis: Unsteadiness on feet (R26.81);Other abnormalities of gait and mobility (R26.89);Muscle weakness (generalized) (M62.81);Pain Pain - Right/Left: Left(Bilateral) Pain - part of body: Ankle and joints of foot                Time: 5409-8119 OT Time Calculation (min): 29 min Charges:  OT General Charges $OT Visit: 1 Visit OT Evaluation $OT Eval Moderate Complexity: 1 Mod OT Treatments $Self Care/Home Management : 8-22 mins G-Codes:     Hulda Humphrey OTR/L [place] 10/20/2017, 12:40 PM

## 2017-10-20 NOTE — Progress Notes (Addendum)
Physical Therapy Treatment Patient Details Name: [name] MRN: [id number] DOB: [birth year] Today's Date: 10/20/2017    History of Present Illness Pt adm with acute on chronic heart failure and new onset afib. Pt developed bil foot pain thought to be gout. PMH - chf, copd with O2 dependence, ckd, gout, venous stasis    PT Comments    Patient is making progress toward PT goals and able to tolerate weightbearing today. Pt required min guard assist overall for safety. Pt does continue to c/o bilat foot/ankle pain however reported it has decreased since yesterday. Pt will need to use RW instead of cane (pt has both at home) upon d/c and pt agrees. Current plan remains appropriate.    Follow Up Recommendations  Home health PT;Supervision for mobility/OOB     Equipment Recommendations  None recommended by PT    Recommendations for Other Services       Precautions / Restrictions Precautions Precautions: Fall Restrictions Weight Bearing Restrictions: No    Mobility  Bed Mobility Overal bed mobility: Needs Assistance Bed Mobility: Supine to Sit     Supine to sit: Min guard;HOB elevated     General bed mobility comments: min guard for safety; increased time and effort  Transfers Overall transfer level: Needs assistance Equipment used: Rolling walker (2 wheeled) Transfers: Sit to/from [facility] Sit to Stand: Min guard Stand pivot transfers: Min guard       General transfer comment: from EOB, BSC, and recliner; cues for safe hand placement; min guard for safety; no physical assist required  Ambulation/Gait Ambulation/Gait assistance: Min guard Ambulation Distance (Feet): (86f X2) Assistive device: Rolling walker (2 wheeled) Gait Pattern/deviations: Step-through pattern;Decreased step length - right;Decreased step length - left;Trunk flexed Gait velocity: decreased   General Gait Details: cues for increased bilat step lengths, posture, and  proximity to RW; one seated rest break required due to pain in L ankle   Stairs            Wheelchair Mobility    Modified Rankin (Stroke Patients Only)       Balance Overall balance assessment: Needs assistance Sitting-balance support: No upper extremity supported;Feet supported Sitting balance-Leahy Scale: Good     Standing balance support: Bilateral upper extremity supported;During functional activity Standing balance-Leahy Scale: Poor                              Cognition Arousal/Alertness: Awake/alert Behavior During Therapy: WFL for tasks assessed/performed Overall Cognitive Status: Within Functional Limits for tasks assessed                                        Exercises      General Comments        Pertinent Vitals/Pain Pain Assessment: Faces Faces Pain Scale: Hurts little more Pain Location: bil feet/ankles ( L>R) Pain Descriptors / Indicators: Aching;Guarding Pain Intervention(s): Limited activity within patient's tolerance;Monitored during session;Repositioned    Home Living                      Prior Function            PT Goals (current goals can now be found in the care plan section) Acute Rehab PT Goals Patient Stated Goal: return home PT Goal Formulation: With patient Time For Goal Achievement: 10/26/17 Potential to Achieve  Goals: Good Progress towards PT goals: Progressing toward goals    Frequency    Min 3X/week      PT Plan Current plan remains appropriate    Co-evaluation              AM-PAC PT "6 Clicks" Daily Activity  Outcome Measure  Difficulty turning over in bed (including adjusting bedclothes, sheets and blankets)?: A Little Difficulty moving from lying on back to sitting on the side of the bed? : Unable Difficulty sitting down on and standing up from a chair with arms (e.g., wheelchair, bedside commode, etc,.)?: Unable Help needed moving to and from a bed to chair  (including a wheelchair)?: A Little Help needed walking in hospital room?: A Little Help needed climbing 3-5 steps with a railing? : A Lot 6 Click Score: 13    End of Session Equipment Utilized During Treatment: Oxygen;Gait belt(2L ) Activity Tolerance: Patient tolerated treatment well Patient left: with call bell/phone within reach;in chair Nurse Communication: Mobility status PT Visit Diagnosis: Other abnormalities of gait and mobility (R26.89);Pain Pain - Right/Left: (bilateral) Pain - part of body: Ankle and joints of foot     Time: 0902-0940 PT Time Calculation (min) (ACUTE ONLY): 38 min  Charges:  $Gait Training: 23-37 mins $Therapeutic Activity: 8-22 mins                    G Codes:       Earney Navy, PTA Pager: [phone]     Darliss Cheney 10/20/2017, 9:59 AM

## 2017-10-21 ENCOUNTER — Telehealth: Payer: Self-pay | Admitting: *Deleted

## 2017-10-21 NOTE — Telephone Encounter (Signed)
Tried calling pt to set-up hosp f/u appt no answer LMOM RTC.Marland KitchenJohny Chess

## 2017-10-22 DIAGNOSIS — J449 Chronic obstructive pulmonary disease, unspecified: Secondary | ICD-10-CM | POA: Diagnosis not present

## 2017-10-22 NOTE — Telephone Encounter (Signed)
Called pt to f/u on call from yesterday. Pt states she had called back and made appt for 10/31/17. Also inform pt had some additional questions to ask regarding discharge. Completed TCM below.[name]  Transition Care Management Follow-up Telephone Call   Date discharged? 10/20/17   How have you been since you were released from the hospital? Pt states she is doing alright   Do you understand why you were in the hospital? YES   Do you understand the discharge instructions? YES   Where were you discharged to? Home   Items Reviewed:  Medications reviewed: YES  Allergies reviewed: YES  Dietary changes reviewed: YES, heart healthy  Referrals reviewed: Pt states she will see her cardiologist this week   Functional Questionnaire:   Activities of Daily Living (ADLs):   She states she are independent in the following: bathing and hygiene, feeding, continence, grooming, toileting and dressing States they require assistance with the following: ambulation   Any transportation issues/concerns?: NO   Any patient concerns? NO   Confirmed importance and date/time of follow-up visits scheduled YES, appt 10/31/17  Provider Appointment booked with Dr. Ronnald Ramp  Confirmed with patient if condition begins to worsen call PCP or go to the ER.  Patient was given the office number and encouraged to call back with question or concerns.  : YES

## 2017-10-23 ENCOUNTER — Other Ambulatory Visit: Payer: Self-pay | Admitting: Internal Medicine

## 2017-10-23 DIAGNOSIS — I10 Essential (primary) hypertension: Secondary | ICD-10-CM

## 2017-10-24 ENCOUNTER — Ambulatory Visit: Payer: Medicare Other | Admitting: Interventional Cardiology

## 2017-10-24 ENCOUNTER — Encounter: Payer: Self-pay | Admitting: Interventional Cardiology

## 2017-10-24 VITALS — BP 144/50 | HR 50 | Ht 60.0 in | Wt 209.0 lb

## 2017-10-24 DIAGNOSIS — I481 Persistent atrial fibrillation: Secondary | ICD-10-CM

## 2017-10-24 DIAGNOSIS — R600 Localized edema: Secondary | ICD-10-CM | POA: Diagnosis not present

## 2017-10-24 DIAGNOSIS — I5032 Chronic diastolic (congestive) heart failure: Secondary | ICD-10-CM

## 2017-10-24 DIAGNOSIS — I4819 Other persistent atrial fibrillation: Secondary | ICD-10-CM

## 2017-10-24 DIAGNOSIS — Z8774 Personal history of (corrected) congenital malformations of heart and circulatory system: Secondary | ICD-10-CM

## 2017-10-24 DIAGNOSIS — I10 Essential (primary) hypertension: Secondary | ICD-10-CM | POA: Diagnosis not present

## 2017-10-24 NOTE — Progress Notes (Signed)
Cardiology Office Note   Date:  10/24/2017   ID:  Laurie, [name] [birth year], MRN [id number]  PCP:  Laurie Lima, MD    No chief complaint on file.  Chronic diastolic heart failure  Wt Readings from Last 3 Encounters:  10/24/17 209 lb (94.8 kg)  10/20/17 210 lb 8.6 oz (95.5 kg)  10/17/17 211 lb (95.7 kg)       History of Present Illness: [name] is a 77 y.o. female  With chronic diastolic heart failure.  Hospital records from Jan 2019 report: "chronic GI bleeding secondary to AVMs-presented to the hospital for evaluation of low hemoglobin, and decompensated diastolic heart failure. She was transfused PRBC 2 units, and started on IV diuretics. Hospital course has been complicated by development of acute kidney injury.   Acute on chronic diastolic heart failure:Managed with IV diuretic regimen-however hospital course complicated by acute kidney injury. Diuretics were held-briefly hydrated. Diuretics were restarted yesterday, weight down to 205 pounds from a peak of 209 pounds on admission. Would avoid metolazone and ACE inhibitor for now, and will continue with torsemide 40 mg daily that was started on 1/16. Follow volume status and weights closely in the outpatient setting. Follow electrolytes-and adjust diuretic regimen as needed.   Acute kidney injury disease stage DJM:EQASTM hemodynamically mediated-improving with supportive care.Diuretics have been restarted-continue to follow closely in the outpatient setting.  Severe anemia due to chronic GI bleeding:Status post 2 units of PRBC-hemoglobin currently stable. She does not give a history of melena/hematochezia overt GI bleeding. GI consulted-recommendations are for supportive care, per GI-endoscopic evaluation only indicated when patient develops overt GI bleeding.Continue iron supplementation."  She was transfused.  Echo was done showing diastolic heart failure.  AKI resolved.   In March  2019, she was again admitted with acute on chronic diastolic heart failure.  She was also found to be in new onset atrial fibrillation with rapid ventricular response.  She ruled out for MI.  TSH was normal.  She converted back to sinus rhythm on oral metoprolol.  Due to her history of GI bleed, she was not a candidate for anticoagulation.  Atrial fibrillation was likely the trigger that caused her diastolic heart failure.  Since hospital discharge Denies : Chest pain. Dizziness.  Nitroglycerin use. Orthopnea. Palpitations. Paroxysmal nocturnal dyspnea. Shortness of breath. Syncope.     Past Medical History:  Diagnosis Date  . CHF (congestive heart failure) ([place]) 08/31/03  . Colonic polyp 02/16/2008   Tubular adenoma  . COPD (chronic obstructive pulmonary disease) ([place]) 01/27/98  . GERD (gastroesophageal reflux disease) 07/30/92  . Gout   . HTN (hypertension) 07/30/1988  . Hyperlipidemia 11/27/96  . Kidney stone 1960, 1972, 1991  . Renal insufficiency   . Ulcerative colitis     Past Surgical History:  Procedure Laterality Date  . BRONCHOSCOPY    . COLONOSCOPY WITH PROPOFOL N/A 02/23/2015   Procedure: COLONOSCOPY WITH PROPOFOL;  Surgeon: Laurie Artist, MD;  Location: [facility];  Service: Endoscopy;  Laterality: N/A;  . ESOPHAGOGASTRODUODENOSCOPY (EGD) WITH PROPOFOL N/A 02/23/2015   Procedure: ESOPHAGOGASTRODUODENOSCOPY (EGD) WITH PROPOFOL;  Surgeon: Laurie Artist, MD;  Location: [facility];  Service: Endoscopy;  Laterality: N/A;  . NO PAST SURGERIES       Current Outpatient Medications  Medication Sig Dispense Refill  . acetaminophen (TYLENOL) 325 MG tablet Take 650 mg by mouth every 6 (six) hours as needed for moderate pain, fever or headache.    . albuterol (PROAIR  HFA) 108 (90 Base) MCG/ACT inhaler Inhale 1-2 puffs into the lungs every 6 (six) hours as needed for wheezing or shortness of breath. 18 g 11  . albuterol (PROVENTIL) (2.5 MG/3ML) 0.083% nebulizer solution Take 3 mLs (2.5  mg total) by nebulization every 6 (six) hours as needed for wheezing or shortness of breath. 150 mL 11  . allopurinol (ZYLOPRIM) 100 MG tablet TAKE 1 TABLET BY MOUTH EVERY DAY FOR GOUT (Patient taking differently: TAKE 100 mg TABLET BY MOUTH EVERY DAY FOR GOUT) 90 tablet 0  . cyanocobalamin 2000 MCG tablet Take 1 tablet (2,000 mcg total) by mouth daily. (Patient taking differently: Take 2,000 mcg by mouth every evening. ) 90 tablet 3  . Dietary Management Product (VASCULERA) TABS Take 1 tablet by mouth daily. 90 tablet 1  . ferrous sulfate 325 (65 FE) MG tablet Take 1 tablet (325 mg total) by mouth 2 (two) times daily with a meal. 60 tablet 11  . fluticasone furoate-vilanterol (BREO ELLIPTA) 100-25 MCG/INH AEPB Inhale 1 puff into the lungs daily. 60 each 11  . isosorbide-hydrALAZINE (BIDIL) 20-37.5 MG tablet Take 1 tablet by mouth 3 (three) times daily. 270 tablet 1  . metoprolol tartrate 75 MG TABS Take 75 mg by mouth 2 (two) times daily. 60 tablet 0  . predniSONE (DELTASONE) 20 MG tablet Take 2 tablets (40 mg total) by mouth daily before breakfast for 4 days. 8 tablet 0  . torsemide (DEMADEX) 20 MG tablet Take 2 tablets (40 mg total) by mouth daily. 60 tablet 0  . Vitamin D, Ergocalciferol, (DRISDOL) 50000 units CAPS capsule TAKE ONE CAPSULE BY MOUTH ONCE WEEKLY (Patient taking differently: TAKE 50,000 CAPSULE BY MOUTH ONCE WEEKLY) 12 capsule 1   No current facility-administered medications for this visit.     Allergies:   Celebrex [celecoxib]; Enalapril; Lipitor [atorvastatin]; Amlodipine besylate; Amoxicillin; Codeine sulfate; and Hydrocodone-acetaminophen    Social History:  The patient  reports that she quit smoking about 21 years ago. She has never used smokeless tobacco. She reports that she does not drink alcohol or use drugs.   Family History:  The patient's family history includes Cervical cancer in her sister; Clotting disorder in her sister; Crohn's disease in her brother; Diabetes  in her mother and sister; Heart attack in her sister; Heart disease in her brother, brother, mother, and sister; Kidney cancer in her mother; Lung cancer in her sister; Stomach cancer in her mother; Stroke in her father and mother.    ROS:  Please see the history of present illness.   Otherwise, review of systems are positive for leg edema.   All other systems are reviewed and negative.    PHYSICAL EXAM: VS:  BP (!) 144/50   Pulse (!) 50   Ht 5' (1.524 m)   Wt 209 lb (94.8 kg)   SpO2 95%   BMI 40.82 kg/m  , BMI Body mass index is 40.82 kg/m. GEN: Well nourished, well developed, in no acute distress  HEENT: normal  Neck: no JVD, carotid bruits, or masses Cardiac: Bradycardic, regular rhythm; no murmurs, rubs, or gallops, bilateral erythema and pitting edema  Respiratory:  clear to auscultation bilaterally, normal work of breathing GI: soft, nontender, nondistended, + BS MS: no deformity or atrophy  Skin: warm and dry, no rash Neuro:  Strength and sensation are intact Psych: euthymic mood, full affect   EKG:   The ekg ordered today demonstrates sinus bradycardia, LVH, nonspecific ST segment changes   Recent Labs: 08/08/2017: ALT  11; Pro B Natriuretic peptide (BNP) 304.0 10/17/2017: B Natriuretic Peptide 324.4; TSH 3.276 10/20/2017: BUN 38; Creatinine, Ser 1.65; Hemoglobin 9.3; Magnesium 2.6; Platelets 217; Potassium 5.0; Sodium 136   Lipid Panel    Component Value Date/Time   CHOL 175 09/04/2016 1553   TRIG 215.0 (H) 09/04/2016 1553   HDL 46.10 09/04/2016 1553   CHOLHDL 4 09/04/2016 1553   VLDL 43.0 (H) 09/04/2016 1553   LDLCALC 93 08/17/2015 1436   LDLDIRECT 107.0 09/04/2016 1553     Other studies Reviewed: Additional studies/ records that were reviewed today with results demonstrating: .   ASSESSMENT AND PLAN:  1. Chronic diastolic heart failure: Mild edema.  WIll need BMet rechecked with PMD on 4/9.   2. AFib: Paroxysmal: HR now 50 bpm in NSR.  No room for more  rate slowing drugs.  If she has more atrial fibrillation with rapid ventricular response, would have to consider electrophysiology consult with possible pacemaker implantation. 3. GI bleeds: May need hemoglobin rechecked as well.  She states that it dropped somewhat in the hospital. 4. Bilateral leg edema: Elevate legs. Continue torsemide.  5. HTN:  The current medical regimen is effective;  continue present plan and medications.    Current medicines are reviewed at length with the patient today.  The patient concerns regarding her medicines were addressed.  The following changes have been made:  No change  Labs/ tests ordered today include:  No orders of the defined types were placed in this encounter.   Recommend 150 minutes/week of aerobic exercise Low fat, low carb, high fiber diet recommended  Disposition:   FU as scheduled in June.   Signed, Larae Grooms, MD  10/24/2017 12:16 PM    [address], [place], [place]  [address] Phone: [phone]; Fax: [phone]

## 2017-10-24 NOTE — Patient Instructions (Addendum)
Medication Instructions:  Your physician recommends that you continue on your current medications as directed. Please refer to the Current Medication list given to you today.   Labwork: Your physician recommends that you have lab work drawn when you are seen by your Primary Care Provider in April: BMET  Testing/Procedures: None ordered  Follow-Up: Keep appointment with Dr. Irish Lack on 01/10/18 at 2:20 PM   Any Other Special Instructions Will Be Listed Below (If Applicable).     If you need a refill on your cardiac medications before your next appointment, please call your pharmacy.

## 2017-11-05 ENCOUNTER — Encounter: Payer: Self-pay | Admitting: Internal Medicine

## 2017-11-05 ENCOUNTER — Other Ambulatory Visit (INDEPENDENT_AMBULATORY_CARE_PROVIDER_SITE_OTHER): Payer: Medicare Other

## 2017-11-05 ENCOUNTER — Ambulatory Visit: Payer: Medicare Other | Admitting: Internal Medicine

## 2017-11-05 VITALS — BP 150/50 | HR 55 | Temp 97.8°F | Ht 60.0 in | Wt 210.0 lb

## 2017-11-05 DIAGNOSIS — R7303 Prediabetes: Secondary | ICD-10-CM

## 2017-11-05 DIAGNOSIS — I10 Essential (primary) hypertension: Secondary | ICD-10-CM

## 2017-11-05 DIAGNOSIS — D500 Iron deficiency anemia secondary to blood loss (chronic): Secondary | ICD-10-CM | POA: Diagnosis not present

## 2017-11-05 DIAGNOSIS — N183 Chronic kidney disease, stage 3 unspecified: Secondary | ICD-10-CM

## 2017-11-05 DIAGNOSIS — I480 Paroxysmal atrial fibrillation: Secondary | ICD-10-CM

## 2017-11-05 LAB — BASIC METABOLIC PANEL
BUN: 17 mg/dL (ref 6–23)
CALCIUM: 9.5 mg/dL (ref 8.4–10.5)
CO2: 34 mEq/L — ABNORMAL HIGH (ref 19–32)
Chloride: 100 mEq/L (ref 96–112)
Creatinine, Ser: 1.16 mg/dL (ref 0.40–1.20)
GFR: 48.11 mL/min — AB (ref >60.00)
Glucose, Bld: 101 mg/dL — ABNORMAL HIGH (ref 70–99)
Potassium: 3.8 mEq/L (ref 3.5–5.1)
SODIUM: 140 meq/L (ref 135–145)

## 2017-11-05 LAB — CBC WITH DIFFERENTIAL/PLATELET
BASOS PCT: 0.9 % (ref 0.0–3.0)
Basophils Absolute: 0.1 10*3/uL (ref 0.0–0.1)
EOS ABS: 0.1 10*3/uL (ref 0.0–0.7)
EOS PCT: 0.9 % (ref 0.0–5.0)
HCT: 28.1 % — ABNORMAL LOW (ref 36.0–46.0)
Hemoglobin: 9.3 g/dL — ABNORMAL LOW (ref 12.0–15.0)
LYMPHS ABS: 1 10*3/uL (ref 0.7–4.0)
Lymphocytes Relative: 8.5 % — ABNORMAL LOW (ref 12.0–46.0)
MCHC: 33 g/dL (ref 30.0–36.0)
MCV: 83 fl (ref 78.0–100.0)
MONO ABS: 1.3 10*3/uL — AB (ref 0.1–1.0)
Monocytes Relative: 10.5 % (ref 3.0–12.0)
NEUTROS PCT: 79.2 % — AB (ref 43.0–77.0)
Neutro Abs: 9.5 10*3/uL — ABNORMAL HIGH (ref 1.4–7.7)
Platelets: 223 10*3/uL (ref 150.0–400.0)
RBC: 3.38 Mil/uL — ABNORMAL LOW (ref 3.87–5.11)
RDW: 16.6 % — AB (ref 11.5–15.5)
WBC: 12 10*3/uL — ABNORMAL HIGH (ref 4.0–10.5)

## 2017-11-05 LAB — URINALYSIS, ROUTINE W REFLEX MICROSCOPIC
BILIRUBIN URINE: NEGATIVE
Hgb urine dipstick: NEGATIVE
KETONES UR: NEGATIVE
LEUKOCYTES UA: NEGATIVE
Nitrite: NEGATIVE
PH: 6.5 (ref 5.0–8.0)
Total Protein, Urine: NEGATIVE
URINE GLUCOSE: NEGATIVE
UROBILINOGEN UA: 0.2 (ref 0.0–1.0)

## 2017-11-05 LAB — MICROALBUMIN / CREATININE URINE RATIO
Creatinine,U: 20.7 mg/dL
Microalb Creat Ratio: 3.4 mg/g (ref 0.0–30.0)

## 2017-11-05 LAB — HEMOGLOBIN A1C: HEMOGLOBIN A1C: 5.4 % (ref 4.6–6.5)

## 2017-11-05 NOTE — Progress Notes (Signed)
Subjective:  Patient ID: [name], female    DOB: July 07, 1941  Age: 77 y.o. MRN: [id number]  CC: Anemia; Atrial Fibrillation; and Congestive Heart Failure   HPI [name] presents for f/up - She was recently admitted for acute on chronic congestive heart failure that was exacerbated by new onset atrial fibrillation with rapid ventricular response.  She was admitted and diuresed.  She was started on metoprolol and feels much better.  The lower extremity edema continues to improve.  Her DOE is less than it was before.  She continues to occasionally see blood in her stools related to angiodysplasia in her large and small intestines.  She is compliant with the iron replacement therapy.  Outpatient Medications Prior to Visit  Medication Sig Dispense Refill  . acetaminophen (TYLENOL) 325 MG tablet Take 650 mg by mouth every 6 (six) hours as needed for moderate pain, fever or headache.    . albuterol (PROAIR HFA) 108 (90 Base) MCG/ACT inhaler Inhale 1-2 puffs into the lungs every 6 (six) hours as needed for wheezing or shortness of breath. 18 g 11  . albuterol (PROVENTIL) (2.5 MG/3ML) 0.083% nebulizer solution Take 3 mLs (2.5 mg total) by nebulization every 6 (six) hours as needed for wheezing or shortness of breath. 150 mL 11  . allopurinol (ZYLOPRIM) 100 MG tablet TAKE 1 TABLET BY MOUTH EVERY DAY FOR GOUT (Patient taking differently: TAKE 100 mg TABLET BY MOUTH EVERY DAY FOR GOUT) 90 tablet 0  . cyanocobalamin 2000 MCG tablet Take 1 tablet (2,000 mcg total) by mouth daily. (Patient taking differently: Take 2,000 mcg by mouth every evening. ) 90 tablet 3  . Dietary Management Product (VASCULERA) TABS Take 1 tablet by mouth daily. 90 tablet 1  . ferrous sulfate 325 (65 FE) MG tablet Take 1 tablet (325 mg total) by mouth 2 (two) times daily with a meal. 60 tablet 11  . fluticasone furoate-vilanterol (BREO ELLIPTA) 100-25 MCG/INH AEPB Inhale 1 puff into the lungs daily. 60 each 11  .  isosorbide-hydrALAZINE (BIDIL) 20-37.5 MG tablet Take 1 tablet by mouth 3 (three) times daily. 270 tablet 1  . metoprolol tartrate 75 MG TABS Take 75 mg by mouth 2 (two) times daily. 60 tablet 0  . torsemide (DEMADEX) 20 MG tablet Take 2 tablets (40 mg total) by mouth daily. 60 tablet 0  . Vitamin D, Ergocalciferol, (DRISDOL) 50000 units CAPS capsule TAKE ONE CAPSULE BY MOUTH ONCE WEEKLY (Patient taking differently: TAKE 50,000 CAPSULE BY MOUTH ONCE WEEKLY) 12 capsule 1   No facility-administered medications prior to visit.     ROS Review of Systems  Constitutional: Negative for appetite change, diaphoresis, fatigue and unexpected weight change.  HENT: Negative.   Eyes: Negative for visual disturbance.  Respiratory: Positive for shortness of breath. Negative for cough, chest tightness and wheezing.   Cardiovascular: Positive for leg swelling. Negative for chest pain and palpitations.  Gastrointestinal: Positive for anal bleeding and blood in stool. Negative for abdominal pain, constipation, diarrhea, nausea and vomiting.  Endocrine: Negative.   Genitourinary: Negative.  Negative for decreased urine volume, difficulty urinating, dysuria and hematuria.  Musculoskeletal: Negative.  Negative for arthralgias, back pain, myalgias and neck pain.  Skin: Negative.  Negative for color change and pallor.  Allergic/Immunologic: Negative.   Neurological: Negative.  Negative for dizziness, weakness and light-headedness.  Hematological: Negative for adenopathy. Does not bruise/bleed easily.  Psychiatric/Behavioral: Negative.     Objective:  BP (!) 150/50 (BP Location: Left Arm, Patient Position:  Sitting, Cuff Size: Large)   Pulse (!) 55   Temp 97.8 F (36.6 C) (Oral)   Ht 5' (1.524 m)   Wt 210 lb (95.3 kg)   SpO2 96%   BMI 41.01 kg/m   BP Readings from Last 3 Encounters:  11/05/17 (!) 150/50  10/24/17 (!) 144/50  10/20/17 (!) 126/57    Wt Readings from Last 3 Encounters:  11/05/17 210 lb  (95.3 kg)  10/24/17 209 lb (94.8 kg)  10/20/17 210 lb 8.6 oz (95.5 kg)    Physical Exam  Constitutional: She is oriented to person, place, and time. She appears well-developed and well-nourished. No distress.  HENT:  Nose: Nose normal.  Mouth/Throat: No oropharyngeal exudate.  Neck: Normal range of motion. Neck supple. No JVD present. No thyromegaly present.  Cardiovascular: Regular rhythm. Bradycardia present. Exam reveals no gallop and no friction rub.  Murmur heard.  Systolic murmur is present with a grade of 1/6.  No diastolic murmur is present. Pulmonary/Chest: Effort normal and breath sounds normal. No stridor. No respiratory distress. She has no wheezes. She has no rales.  Abdominal: Soft. Bowel sounds are normal. She exhibits no distension and no mass. There is no tenderness. No hernia.  Musculoskeletal: Normal range of motion. She exhibits edema (trace pitting edema in BLE). She exhibits no tenderness or deformity.  Lymphadenopathy:    She has no cervical adenopathy.  Neurological: She is alert and oriented to person, place, and time.  Skin: Skin is warm and dry. She is not diaphoretic.  Vitals reviewed.   Lab Results  Component Value Date   WBC 12.0 (H) 11/05/2017   HGB 9.3 (L) 11/05/2017   HCT 28.1 (L) 11/05/2017   PLT 223.0 11/05/2017   GLUCOSE 101 (H) 11/05/2017   CHOL 175 09/04/2016   TRIG 215.0 (H) 09/04/2016   HDL 46.10 09/04/2016   LDLDIRECT 107.0 09/04/2016   LDLCALC 93 08/17/2015   ALT 11 (L) 08/08/2017   AST 17 08/08/2017   NA 140 11/05/2017   K 3.8 11/05/2017   CL 100 11/05/2017   CREATININE 1.16 11/05/2017   BUN 17 11/05/2017   CO2 34 (H) 11/05/2017   TSH 3.276 10/17/2017   INR 1.07 08/08/2017   HGBA1C 5.4 11/05/2017   MICROALBUR <0.7 11/05/2017    Dg Chest 2 View  Result Date: 10/17/2017 CLINICAL DATA:  Shortness of breath. EXAM: CHEST - 2 VIEW COMPARISON:  08/10/2017. FINDINGS: Mediastinum and hilar structures normal. Stable cardiomegaly  with normal pulmonary vascularity. Interval clearing of basilar pulmonary infiltrates/edema. Low lung volumes with mild basilar atelectasis. Small left pleural effusion. Biapical pleural thickening noted consistent with scarring. No pneumothorax. Degenerative changes thoracic spine. IMPRESSION: 1. Cardiomegaly with normal pulmonary vascularity. Interval clearing of basilar pulmonary infiltrates/edema. Low lung volumes with mild basilar atelectasis. 2.  Small left pleural effusion. Electronically Signed   By: Marcello Moores  Register   On: 10/17/2017 14:23    Assessment & Plan:   Maydelin was seen today for anemia, atrial fibrillation and congestive heart failure.  Diagnoses and all orders for this visit:  Iron deficiency anemia due to chronic blood loss- Her H&H are stable but based on her symptoms she continues to lose blood.  I have asked her to continue with strict adherence to the iron replacement therapy. -     CBC with Differential/Platelet; Future  Prediabetes- improvement noted -     Basic metabolic panel; Future -     Hemoglobin A1c; Future  Paroxysmal atrial fibrillation ([place])- She is  maintaining good rate and rhythm control.  She is not a candidate for anticoagulation due to ongoing GI bleeding.  Essential hypertension- Her blood pressure is adequately well controlled. -     Urinalysis, Routine w reflex microscopic; Future  CKD (chronic kidney disease), stage III ([place])- Her renal function has improved.  She will continue to avoid nephrotoxic agents. -     Urinalysis, Routine w reflex microscopic; Future -     Microalbumin / creatinine urine ratio; Future   I am having [name] maintain her cyanocobalamin, ferrous sulfate, albuterol, VASCULERA, fluticasone furoate-vilanterol, albuterol, acetaminophen, torsemide, Vitamin D (Ergocalciferol), allopurinol, isosorbide-hydrALAZINE, and Metoprolol Tartrate.  No orders of the defined types were placed in this encounter.    Follow-up:  Return in about 3 months (around 02/04/2018).  [name], MD

## 2017-11-05 NOTE — Patient Instructions (Signed)
Iron Deficiency Anemia, Adult Iron deficiency anemia is a condition in which the concentration of red blood cells or hemoglobin in the blood is below normal because of too little iron. Hemoglobin is a substance in red blood cells that carries oxygen to the body's tissues. When the concentration of red blood cells or hemoglobin is too low, not enough oxygen reaches these tissues. Iron deficiency anemia is usually long-lasting (chronic) and it develops over time. It may or may not cause symptoms. It is a common type of anemia. What are the causes? This condition may be caused by:  Not enough iron in the diet.  Blood loss caused by bleeding in the intestine.  Blood loss from a gastrointestinal condition like Crohn disease.  Frequent blood draws, such as from blood donation.  Abnormal absorption in the gut.  Heavy menstrual periods in women.  Cancers of the gastrointestinal system, such as colon cancer.  What are the signs or symptoms? Symptoms of this condition may include:  Fatigue.  Headache.  Pale skin, lips, and nail beds.  Poor appetite.  Weakness.  Shortness of breath.  Dizziness.  Cold hands and feet.  Fast or irregular heartbeat.  Irritability. This is more common in severe anemia.  Rapid breathing. This is more common in severe anemia.  Mild anemia may not cause any symptoms. How is this diagnosed? This condition is diagnosed based on:  Your medical history.  A physical exam.  Blood tests.  You may have additional tests to find the underlying cause of your anemia, such as:  Testing for blood in the stool (fecal occult blood test).  A procedure to see inside your colon and rectum (colonoscopy).  A procedure to see inside your esophagus and stomach (endoscopy).  A test in which cells are removed from bone marrow (bone marrow aspiration) or fluid is removed from the bone marrow to be examined (biopsy). This is rarely needed.  How is this  treated? This condition is treated by correcting the cause of your iron deficiency. Treatment may involve:  Adding iron-rich foods to your diet.  Taking iron supplements. If you are pregnant or breastfeeding, you may need to take extra iron because your normal diet usually does not provide the amount of iron that you need.  Increasing vitamin C intake. Vitamin C helps your body absorb iron. Your health care provider may recommend that you take iron supplements along with a glass of orange juice or a vitamin C supplement.  Medicines to make heavy menstrual flow lighter.  Surgery.  You may need repeat blood tests to determine whether treatment is working. Depending on the underlying cause, the anemia should be corrected within 2 months of starting treatment. If the treatment does not seem to be working, you may need more testing. Follow these instructions at home: Medicines  Take over-the-counter and prescription medicines only as told by your health care provider. This includes iron supplements and vitamins.  If you cannot tolerate taking iron supplements by mouth, talk with your health care provider about taking them through a vein (intravenously) or an injection into a muscle.  For the best iron absorption, you should take iron supplements when your stomach is empty. If you cannot tolerate them on an empty stomach, you may need to take them with food.  Do not drink milk or take antacids at the same time as your iron supplements. Milk and antacids may interfere with iron absorption.  Iron supplements can cause constipation. To prevent constipation, include fiber   in your diet as told by your health care provider. A stool softener may also be recommended. Eating and drinking  Talk with your health care provider before changing your diet. He or she may recommend that you eat foods that contain a lot of iron, such as: ? Liver. ? Low-fat (lean) beef. ? Breads and cereals that have iron  added to them (are fortified). ? Eggs. ? Dried fruit. ? Dark green, leafy vegetables.  To help your body use the iron from iron-rich foods, eat those foods at the same time as fresh fruits and vegetables that are high in vitamin C. Foods that are high in vitamin C include: ? Oranges. ? Peppers. ? Tomatoes. ? Mangoes.  Drinkenoughfluid to keep your urine clear or pale yellow. General instructions  Return to your normal activities as told by your health care provider. Ask your health care provider what activities are safe for you.  Practice good hygiene. Anemia can make you more prone to illness and infection.  Keep all follow-up visits as told by your health care provider. This is important. Contact a health care provider if:  You feel nauseous or you vomit.  You feel weak.  You have unexplained sweating.  You develop symptoms of constipation, such as: ? Having fewer than three bowel movements a week. ? Straining to have a bowel movement. ? Having stools that are hard, dry, or larger than normal. ? Feeling full or bloated. ? Pain in the lower abdomen. ? Not feeling relief after having a bowel movement. Get help right away if:  You faint. If this happens, do not drive yourself to the hospital. Call your local emergency services (911 in the [place]  You have chest pain.  You have shortness of breath that: ? Is severe. ? Gets worse with physical activity.  You have a rapid heartbeat.  You become light-headed when getting up from a sitting or lying down position. This information is not intended to replace advice given to you by your health care provider. Make sure you discuss any questions you have with your health care provider. Document Released: 07/13/2000 Document Revised: 04/04/2016 Document Reviewed: 04/04/2016 Elsevier Interactive Patient Education  2018 Elsevier Inc.  

## 2017-11-15 ENCOUNTER — Other Ambulatory Visit: Payer: Self-pay | Admitting: Internal Medicine

## 2017-11-22 DIAGNOSIS — J449 Chronic obstructive pulmonary disease, unspecified: Secondary | ICD-10-CM | POA: Diagnosis not present

## 2017-11-29 ENCOUNTER — Encounter: Payer: Self-pay | Admitting: Interventional Cardiology

## 2017-12-01 ENCOUNTER — Other Ambulatory Visit: Payer: Self-pay | Admitting: Internal Medicine

## 2017-12-01 DIAGNOSIS — I872 Venous insufficiency (chronic) (peripheral): Secondary | ICD-10-CM

## 2017-12-04 ENCOUNTER — Telehealth: Payer: Self-pay | Admitting: Internal Medicine

## 2017-12-04 NOTE — Telephone Encounter (Signed)
Copied from [place] [id number]. Topic: General - Other >> Dec 04, 2017 11:26 AM Oneta Rack wrote:  Relation to pt: self Call back number: [phone] Pharmacy: [facility], [address] [phone] (Phone) [phone] (Fax)  Reason for call:  Patient states insurance will not cover metoprolol tartrate 75 MG TABS but will cover 50 MG, patient requesting a new rX, please advise

## 2017-12-05 ENCOUNTER — Other Ambulatory Visit: Payer: Self-pay | Admitting: Interventional Cardiology

## 2017-12-05 ENCOUNTER — Telehealth: Payer: Self-pay

## 2017-12-05 NOTE — Telephone Encounter (Signed)
Key: [id number]   Started via Cover My Meds today.

## 2017-12-05 NOTE — Telephone Encounter (Signed)
Pt request for a change in dosage of metoprolol tartrate 75 mg tab to 50 mg so the insurance will pay for her medication. Please advise. Provider  Dr. Ronnald Ramp [place]  04/09

## 2017-12-10 NOTE — Telephone Encounter (Signed)
PA was denied

## 2017-12-11 ENCOUNTER — Other Ambulatory Visit: Payer: Self-pay

## 2017-12-11 NOTE — Telephone Encounter (Signed)
Cardiology changed dose.

## 2017-12-22 DIAGNOSIS — J449 Chronic obstructive pulmonary disease, unspecified: Secondary | ICD-10-CM | POA: Diagnosis not present

## 2018-01-01 ENCOUNTER — Ambulatory Visit (INDEPENDENT_AMBULATORY_CARE_PROVIDER_SITE_OTHER)
Admission: RE | Admit: 2018-01-01 | Discharge: 2018-01-01 | Disposition: A | Discharge status: Home / Self Care | Payer: Medicare Other | Source: Ambulatory Visit | Attending: Internal Medicine | Admitting: Internal Medicine

## 2018-01-01 ENCOUNTER — Encounter: Payer: Self-pay | Admitting: Internal Medicine

## 2018-01-01 ENCOUNTER — Other Ambulatory Visit (INDEPENDENT_AMBULATORY_CARE_PROVIDER_SITE_OTHER): Payer: Medicare Other

## 2018-01-01 ENCOUNTER — Telehealth: Payer: Self-pay

## 2018-01-01 ENCOUNTER — Ambulatory Visit: Payer: Medicare Other | Admitting: Internal Medicine

## 2018-01-01 VITALS — BP 138/52 | HR 52 | Temp 98.0°F | Wt 202.0 lb

## 2018-01-01 DIAGNOSIS — R7989 Other specified abnormal findings of blood chemistry: Secondary | ICD-10-CM

## 2018-01-01 DIAGNOSIS — M542 Cervicalgia: Secondary | ICD-10-CM | POA: Insufficient documentation

## 2018-01-01 DIAGNOSIS — M4802 Spinal stenosis, cervical region: Secondary | ICD-10-CM | POA: Diagnosis not present

## 2018-01-01 DIAGNOSIS — R05 Cough: Secondary | ICD-10-CM | POA: Diagnosis not present

## 2018-01-01 DIAGNOSIS — B028 Zoster with other complications: Secondary | ICD-10-CM | POA: Diagnosis not present

## 2018-01-01 DIAGNOSIS — R071 Chest pain on breathing: Secondary | ICD-10-CM | POA: Diagnosis not present

## 2018-01-01 DIAGNOSIS — R0602 Shortness of breath: Secondary | ICD-10-CM | POA: Diagnosis not present

## 2018-01-01 DIAGNOSIS — I10 Essential (primary) hypertension: Secondary | ICD-10-CM

## 2018-01-01 DIAGNOSIS — R0609 Other forms of dyspnea: Secondary | ICD-10-CM | POA: Diagnosis not present

## 2018-01-01 DIAGNOSIS — R079 Chest pain, unspecified: Secondary | ICD-10-CM | POA: Diagnosis not present

## 2018-01-01 DIAGNOSIS — R059 Cough, unspecified: Secondary | ICD-10-CM

## 2018-01-01 DIAGNOSIS — G992 Myelopathy in diseases classified elsewhere: Secondary | ICD-10-CM | POA: Insufficient documentation

## 2018-01-01 HISTORY — DX: Myelopathy in diseases classified elsewhere: G99.2

## 2018-01-01 LAB — CBC WITH DIFFERENTIAL/PLATELET
BASOS ABS: 0.1 10*3/uL (ref 0.0–0.1)
Basophils Relative: 0.6 % (ref 0.0–3.0)
EOS ABS: 0.3 10*3/uL (ref 0.0–0.7)
Eosinophils Relative: 3.4 % (ref 0.0–5.0)
HCT: 30.5 % — ABNORMAL LOW (ref 36.0–46.0)
Hemoglobin: 10.1 g/dL — ABNORMAL LOW (ref 12.0–15.0)
LYMPHS ABS: 1.4 10*3/uL (ref 0.7–4.0)
Lymphocytes Relative: 16.9 % (ref 12.0–46.0)
MCHC: 33.2 g/dL (ref 30.0–36.0)
MCV: 81.4 fl (ref 78.0–100.0)
MONO ABS: 0.9 10*3/uL (ref 0.1–1.0)
Monocytes Relative: 10.6 % (ref 3.0–12.0)
NEUTROS ABS: 5.5 10*3/uL (ref 1.4–7.7)
NEUTROS PCT: 68.5 % (ref 43.0–77.0)
PLATELETS: 235 10*3/uL (ref 150.0–400.0)
RBC: 3.74 Mil/uL — ABNORMAL LOW (ref 3.87–5.11)
RDW: 14.5 % (ref 11.5–15.5)
WBC: 8.1 10*3/uL (ref 4.0–10.5)

## 2018-01-01 LAB — BASIC METABOLIC PANEL
BUN: 29 mg/dL — ABNORMAL HIGH (ref 6–23)
CALCIUM: 10 mg/dL (ref 8.4–10.5)
CO2: 34 meq/L — AB (ref 19–32)
CREATININE: 1.49 mg/dL — AB (ref 0.40–1.20)
Chloride: 99 mEq/L (ref 96–112)
GFR: 36.02 mL/min — ABNORMAL LOW (ref >60.00)
GLUCOSE: 106 mg/dL — AB (ref 70–99)
Potassium: 4.2 mEq/L (ref 3.5–5.1)
SODIUM: 139 meq/L (ref 135–145)

## 2018-01-01 LAB — BRAIN NATRIURETIC PEPTIDE: Pro B Natriuretic peptide (BNP): 145 pg/mL — ABNORMAL HIGH (ref 0.0–100.0)

## 2018-01-01 LAB — D-DIMER, QUANTITATIVE (NOT AT ARMC): D DIMER QUANT: 2.35 ug{FEU}/mL — AB (ref <0.50)

## 2018-01-01 MED ORDER — METHYLPREDNISOLONE ACETATE 80 MG/ML IJ SUSP
80.0000 mg | Freq: Once | INTRAMUSCULAR | Status: AC
  Administered 2018-01-01: 80 mg via INTRAMUSCULAR

## 2018-01-01 MED ORDER — OXYCODONE-ACETAMINOPHEN 7.5-325 MG PO TABS: 1.0000 | ORAL_TABLET | Freq: Three times a day (TID) | ORAL | 0 refills | Status: DC | PRN

## 2018-01-01 MED ORDER — IOPAMIDOL (ISOVUE-370) INJECTION 76%
64.0000 mL | Freq: Once | INTRAVENOUS | Status: AC | PRN
  Administered 2018-01-01: 64 mL via INTRAVENOUS

## 2018-01-01 NOTE — Progress Notes (Signed)
Subjective:  Patient ID: [name], female    DOB: 1941/06/10  Age: 77 y.o. MRN: [id number]  CC: Shoulder Pain (right shoulder pain x2 weeks, did not fall, no heavy lifting, just started hurting) and Cough   HPI [name] presents for concerns about a 2-week history of vague, migrating pain around the right side of the neck, the right shoulder blade, the right trapezius, the right axilla, and the right shoulder.  When asked to describe the pain she says the skin is painful to touch and feels raw.  She has also had a few episodes of cough and shortness of breath but she denies hemoptysis or wheezing.  The cough is productive of white phlegm.  She has tried to control the pain with Tylenol, icy hot, capsaicin, heat, and ice but has not gotten any symptom relief.  Outpatient Medications Prior to Visit  Medication Sig Dispense Refill  . acetaminophen (TYLENOL) 325 MG tablet Take 650 mg by mouth every 6 (six) hours as needed for moderate pain, fever or headache.    . albuterol (PROAIR HFA) 108 (90 Base) MCG/ACT inhaler Inhale 1-2 puffs into the lungs every 6 (six) hours as needed for wheezing or shortness of breath. 18 g 11  . albuterol (PROVENTIL) (2.5 MG/3ML) 0.083% nebulizer solution Take 3 mLs (2.5 mg total) by nebulization every 6 (six) hours as needed for wheezing or shortness of breath. 150 mL 11  . allopurinol (ZYLOPRIM) 100 MG tablet TAKE 100 mg TABLET BY MOUTH EVERY DAY FOR GOUT 90 tablet 0  . cyanocobalamin 2000 MCG tablet Take 1 tablet (2,000 mcg total) by mouth daily. (Patient taking differently: Take 2,000 mcg by mouth every evening. ) 90 tablet 3  . Dietary Management Product (VASCULERA) TABS TAKE ONE TABLET BY MOUTH EVERY DAY 90 tablet 1  . ferrous sulfate 325 (65 FE) MG tablet Take 1 tablet (325 mg total) by mouth 2 (two) times daily with a meal. 60 tablet 11  . fluticasone furoate-vilanterol (BREO ELLIPTA) 100-25 MCG/INH AEPB Inhale 1 puff into the lungs daily. 60 each  11  . isosorbide-hydrALAZINE (BIDIL) 20-37.5 MG tablet Take 1 tablet by mouth 3 (three) times daily. 270 tablet 1  . metoprolol tartrate (LOPRESSOR) 50 MG tablet TAKE 1 TABLET(50 MG) BY MOUTH TWICE DAILY 180 tablet 3  . torsemide (DEMADEX) 20 MG tablet Take 2 tablets (40 mg total) by mouth daily. 60 tablet 0  . Vitamin D, Ergocalciferol, (DRISDOL) 50000 units CAPS capsule TAKE ONE CAPSULE BY MOUTH ONCE WEEKLY (Patient taking differently: TAKE 50,000 CAPSULE BY MOUTH ONCE WEEKLY) 12 capsule 1   No facility-administered medications prior to visit.     ROS Review of Systems  Constitutional: Negative for chills, diaphoresis, fatigue and fever.  HENT: Negative.  Negative for trouble swallowing.   Eyes: Negative for visual disturbance.  Respiratory: Positive for cough and shortness of breath. Negative for chest tightness, wheezing and stridor.   Cardiovascular: Positive for chest pain. Negative for palpitations and leg swelling.  Gastrointestinal: Negative for abdominal pain, constipation, diarrhea, nausea and vomiting.  Endocrine: Negative.   Genitourinary: Negative.  Negative for difficulty urinating, dysuria and urgency.  Musculoskeletal: Positive for arthralgias and neck pain.  Skin: Negative for color change, pallor and rash.  Allergic/Immunologic: Negative.   Neurological: Negative.  Negative for dizziness, weakness, light-headedness and numbness.  Hematological: Negative for adenopathy. Does not bruise/bleed easily.  Psychiatric/Behavioral: Negative.     Objective:  BP (!) 138/52   Pulse (!) 52  Temp 98 F (36.7 C)   Wt 202 lb (91.6 kg)   SpO2 93%   BMI 39.45 kg/m   BP Readings from Last 3 Encounters:  01/01/18 (!) 138/52  11/05/17 (!) 150/50  10/24/17 (!) 144/50    Wt Readings from Last 3 Encounters:  01/01/18 202 lb (91.6 kg)  11/05/17 210 lb (95.3 kg)  10/24/17 209 lb (94.8 kg)    Physical Exam  Constitutional: She is oriented to person, place, and time. No  distress.  HENT:  Mouth/Throat: Oropharynx is clear and moist. No oropharyngeal exudate.  Eyes: Conjunctivae are normal. No scleral icterus.  Neck: Normal range of motion. Neck supple. No JVD present. No thyromegaly present.  Cardiovascular: Normal rate, regular rhythm and normal heart sounds. Exam reveals no friction rub.  No murmur heard. Pulmonary/Chest: Effort normal and breath sounds normal. No respiratory distress. She has no wheezes. She has no rales. She exhibits no tenderness.  Abdominal: Soft. Bowel sounds are normal. She exhibits no mass. There is no hepatosplenomegaly. There is no tenderness. No hernia.  Musculoskeletal: Normal range of motion. She exhibits no edema, tenderness or deformity.       Cervical back: Normal. She exhibits normal range of motion, no tenderness, no bony tenderness, no swelling, no edema and no deformity.       Thoracic back: She exhibits no tenderness, no swelling and no edema.  Lymphadenopathy:    She has no cervical adenopathy.  Neurological: She is alert and oriented to person, place, and time.  Skin: Skin is warm and dry. No rash noted. She is not diaphoretic. No pallor.     Vitals reviewed.   Lab Results  Component Value Date   WBC 8.1 01/01/2018   HGB 10.1 (L) 01/01/2018   HCT 30.5 (L) 01/01/2018   PLT 235.0 01/01/2018   GLUCOSE 101 (H) 11/05/2017   CHOL 175 09/04/2016   TRIG 215.0 (H) 09/04/2016   HDL 46.10 09/04/2016   LDLDIRECT 107.0 09/04/2016   LDLCALC 93 08/17/2015   ALT 11 (L) 08/08/2017   AST 17 08/08/2017   NA 140 11/05/2017   K 3.8 11/05/2017   CL 100 11/05/2017   CREATININE 1.16 11/05/2017   BUN 17 11/05/2017   CO2 34 (H) 11/05/2017   TSH 3.276 10/17/2017   INR 1.07 08/08/2017   HGBA1C 5.4 11/05/2017   MICROALBUR <0.7 11/05/2017    Dg Chest 2 View  Result Date: 10/17/2017 CLINICAL DATA:  Shortness of breath. EXAM: CHEST - 2 VIEW COMPARISON:  08/10/2017. FINDINGS: Mediastinum and hilar structures normal. Stable  cardiomegaly with normal pulmonary vascularity. Interval clearing of basilar pulmonary infiltrates/edema. Low lung volumes with mild basilar atelectasis. Small left pleural effusion. Biapical pleural thickening noted consistent with scarring. No pneumothorax. Degenerative changes thoracic spine. IMPRESSION: 1. Cardiomegaly with normal pulmonary vascularity. Interval clearing of basilar pulmonary infiltrates/edema. Low lung volumes with mild basilar atelectasis. 2.  Small left pleural effusion. Electronically Signed   By: Marcello Moores  Register   On: 10/17/2017 14:23    Dg Chest 2 View  Result Date: 01/01/2018 CLINICAL DATA:  Right shoulder and scapular pain for the past 2 weeks. Occasional cough, chest congestion, and shortness of breath. History of asthma-COPD, CHF, cor pulmonale, former smoker. EXAM: CHEST - 2 VIEW COMPARISON:  PA and lateral chest x-ray of October 17, 2017 FINDINGS: The lungs are adequately inflated. There is no focal infiltrate. There is stable biapical pleural thickening. There is a small left pleural effusion which has increased slightly in size  since the previous study. The heart is mildly enlarged. The pulmonary vascularity is not engorged. There is calcification in the wall of the aortic arch. The mediastinum is normal in width. The bony thorax is unremarkable. There is mild degenerative change of the right AC joint and there is narrowing of the subacromial subdeltoid space of the right shoulder. IMPRESSION: Chronic bronchitic-reactive airway changes. No alveolar pneumonia. Small left pleural effusion more conspicuous than on the previous study. Cardiomegaly without pulmonary vascular congestion. Thoracic aortic atherosclerosis. Degenerative changes of the right shoulder. Electronically Signed   By: David  Martinique M.D.   On: 01/01/2018 09:32   Dg Cervical Spine Complete  Result Date: 01/01/2018 CLINICAL DATA:  History of right-sided neck pain radiating into the right shoulder. EXAM: CERVICAL  SPINE - COMPLETE 4+ VIEW COMPARISON:  Lateral view of the cervical spine from a skeletal survey of March 24, 2013 FINDINGS: The bones are subjectively osteopenic. There is approximately 3 mm of grade 1 anterolisthesis of C5 with respect to C6 that is slightly more conspicuous than on the study of 2014. There is 1-2 mm of anterolisthesis of C4 with respect to C5 which is new since the 2014 study. There is no compression fracture. There is mild disc space narrowing at C6-7 and to a lesser extent at C4-5 and C5-6. The odontoid is intact. The prevertebral soft tissue spaces are mildly prominent but stable. The oblique views reveal moderate bony encroachment upon the mid cervical neural foramina on the right. IMPRESSION: Increased conspicuity of grade 1 anterolisthesis of C5 with respect to C6 with degree of slippage of 3 mm. There is minimal C4-5 anterolisthesis amounting to 1-2 mm which is new. There is bony encroachment upon the neural foramina in the mid cervical spine on the right. No compression fracture. Mild degenerative disc space narrowing at C4-5, 5-6, and 6-7. Electronically Signed   By: David  Martinique M.D.   On: 01/01/2018 09:36     Assessment & Plan:   Akiba was seen today for shoulder pain and cough.  Diagnoses and all orders for this visit:  Cough- Her chest x-ray is normal. -     CBC with Differential/Platelet; Future -     DG Chest 2 View; Future  Chest pain at rest- Her chest x-ray is normal, her white cell count is not elevated, her BNP is lower than her usual level but her d-dimer is elevated.  I have asked her to undergo CT angina to screen for pulmonary embolus. -     CBC with Differential/Platelet; Future -     Brain natriuretic peptide; Future -     D-dimer, quantitative (not at [facility]); Future -     CT Angio Chest W/Cm &/Or Wo Cm; Future  Neck pain on right side -     DG Cervical Spine Complete; Future -     oxyCODONE-acetaminophen (PERCOCET) 7.5-325 MG tablet; Take 1 tablet  by mouth every 8 (eight) hours as needed.  Stenosis of cervical spine with myelopathy (HCC)-her symptoms may be caused by cervical spinal stenosis with radiculopathy.  Have asked her to try oxycodone for relief from the discomfort. -     oxyCODONE-acetaminophen (PERCOCET) 7.5-325 MG tablet; Take 1 tablet by mouth every 8 (eight) hours as needed. -     methylPREDNISolone acetate (DEPO-MEDROL) injection 80 mg  Herpes zoster with other complication  D-dimer, elevated -     CT Angio Chest W/Cm &/Or Wo Cm; Future  Chest pain on breathing -  CT Angio Chest W/Cm &/Or Wo Cm; Future   I have changed Larry Sierras. Pollett's oxyCODONE-acetaminophen. I am also having her maintain her cyanocobalamin, ferrous sulfate, albuterol, fluticasone furoate-vilanterol, albuterol, acetaminophen, torsemide, Vitamin D (Ergocalciferol), isosorbide-hydrALAZINE, allopurinol, VASCULERA, and metoprolol tartrate. We administered methylPREDNISolone acetate.  Meds ordered this encounter  Medications  . oxyCODONE-acetaminophen (PERCOCET) 7.5-325 MG tablet    Sig: Take 1 tablet by mouth every 8 (eight) hours as needed.    Dispense:  90 tablet    Refill:  0  . methylPREDNISolone acetate (DEPO-MEDROL) injection 80 mg     Follow-up: Return in about 1 day (around 01/02/2018).  [name], MD

## 2018-01-01 NOTE — Telephone Encounter (Signed)
Per PCP - Pt to be informed of the elevated D Dimer, enter add on sheet for lab to do a BMet, and inform referrals of same.   Pt has been given results and lab has add on. Referral is aware of order and will contact patient to schedule.

## 2018-01-01 NOTE — Patient Instructions (Signed)
Chest Wall Pain °Chest wall pain is pain in or around the bones and muscles of your chest. Sometimes, an injury causes this pain. Sometimes, the cause may not be known. This pain may take several weeks or longer to get better. °Follow these instructions at home: °Pay attention to any changes in your symptoms. Take these actions to help with your pain: °· Rest as told by your health care provider. °· Avoid activities that cause pain. These include any activities that use your chest muscles or your abdominal and side muscles to lift heavy items. °· If directed, apply ice to the painful area: °? Put ice in a plastic bag. °? Place a towel between your skin and the bag. °? Leave the ice on for 20 minutes, 2-3 times per day. °· Take over-the-counter and prescription medicines only as told by your health care provider. °· Do not use tobacco products, including cigarettes, chewing tobacco, and e-cigarettes. If you need help quitting, ask your health care provider. °· Keep all follow-up visits as told by your health care provider. This is important. ° °Contact a health care provider if: °· You have a fever. °· Your chest pain becomes worse. °· You have new symptoms. °Get help right away if: °· You have nausea or vomiting. °· You feel sweaty or light-headed. °· You have a cough with phlegm (sputum) or you cough up blood. °· You develop shortness of breath. °This information is not intended to replace advice given to you by your health care provider. Make sure you discuss any questions you have with your health care provider. °Document Released: 07/16/2005 Document Revised: 11/24/2015 Document Reviewed: 10/11/2014 °Elsevier Interactive Patient Education © 2018 [employer] ° °

## 2018-01-10 ENCOUNTER — Ambulatory Visit: Payer: Medicare Other | Admitting: Interventional Cardiology

## 2018-01-14 ENCOUNTER — Telehealth: Payer: Self-pay | Admitting: Interventional Cardiology

## 2018-01-14 NOTE — Telephone Encounter (Signed)
Discussed with DOD, Dr. Curt Bears, and he agrees to decrease patient's metoprolol to 50 mg BID and arrange follow-up with Dr. Irish Lack.   Called patient and made patient aware that the DOD agreed that we need to decrease her metoprolol to 50 mg BID. Offered appointment for this Friday but patient unable to come. Appointment made for 6/27 at 1:40 PM. Instructed patient to let us know if she continues to have lower HRs or if she becomes symptomatic again. Patient verbalized understanding and thanked me for the call.

## 2018-01-14 NOTE — Telephone Encounter (Signed)
New message     STAT if HR is under 50 or over 120 (normal HR is 60-100 beats per minute)  1) What is your heart rate? 47 today  2) Do you have a log of your heart rate readings (document readings)? 30, 39  3) Do you have any other symptoms? WEAK

## 2018-01-14 NOTE — Telephone Encounter (Signed)
Returned call to patient who states that on Saturday her HR was in the 30s. She states that it was irregular and that she felt lightheaded, dizzy, weak, and the vision in her left eye became dark and fuzzy. She states that this lasted for 2-3 hours. She states that Sunday and Monday her HR was in the 40s. Patient states that she had another episode earlier today where her HR was 30 and the vision in her left eye was blurry. Patient does not have any BP readings. She states that her pulse feels regular when her HR is in the 40s, but it feels irregular when it is in the 30s. Patient states that her HR is 47 now and she is not having any symptoms at this time. Patient takes metoprolol 75 mg BID. This was increased when she was seen in the ER 10/17/17 for Afib RVR. Patient's HR at her last OV on 10/24/17 was 50 bpm in NSR. Per OV note, "HR now 50 bpm in NSR.  No room for more rate slowing drugs.  If she has more atrial fibrillation with rapid ventricular response, would have to consider electrophysiology consult with possible pacemaker implantation." Made patient aware that we will probably decrease her metoprolol to 50 mg BID, but I will review with DOD and call her back.

## 2018-01-22 ENCOUNTER — Other Ambulatory Visit: Payer: Self-pay | Admitting: Internal Medicine

## 2018-01-22 DIAGNOSIS — J449 Chronic obstructive pulmonary disease, unspecified: Secondary | ICD-10-CM | POA: Diagnosis not present

## 2018-01-22 DIAGNOSIS — I10 Essential (primary) hypertension: Secondary | ICD-10-CM

## 2018-01-23 ENCOUNTER — Encounter: Payer: Self-pay | Admitting: Interventional Cardiology

## 2018-01-23 ENCOUNTER — Encounter (INDEPENDENT_AMBULATORY_CARE_PROVIDER_SITE_OTHER): Payer: Self-pay

## 2018-01-23 ENCOUNTER — Ambulatory Visit: Payer: Medicare Other | Admitting: Interventional Cardiology

## 2018-01-23 VITALS — BP 158/72 | HR 48 | Ht 60.0 in | Wt 198.4 lb

## 2018-01-23 DIAGNOSIS — I495 Sick sinus syndrome: Secondary | ICD-10-CM

## 2018-01-23 DIAGNOSIS — I5032 Chronic diastolic (congestive) heart failure: Secondary | ICD-10-CM | POA: Diagnosis not present

## 2018-01-23 DIAGNOSIS — Z8774 Personal history of (corrected) congenital malformations of heart and circulatory system: Secondary | ICD-10-CM | POA: Diagnosis not present

## 2018-01-23 DIAGNOSIS — I481 Persistent atrial fibrillation: Secondary | ICD-10-CM

## 2018-01-23 DIAGNOSIS — I10 Essential (primary) hypertension: Secondary | ICD-10-CM

## 2018-01-23 DIAGNOSIS — I4819 Other persistent atrial fibrillation: Secondary | ICD-10-CM

## 2018-01-23 NOTE — Progress Notes (Signed)
Cardiology Office Note   Date:  01/23/2018   ID:  [name], [name] October 22, 1940, MRN [id number]  PCP:  [name], MD    No chief complaint on file.  AFib, chronic diastolic heart failure  Wt Readings from Last 3 Encounters:  01/23/18 198 lb 6.4 oz (90 kg)  01/01/18 202 lb (91.6 kg)  11/05/17 210 lb (95.3 kg)       History of Present Illness: [name] is a 77 y.o. female  With chronic diastolic heart failure.  Hospital records from Jan 2019 report: "chronic GI bleeding secondary to AVMs-presented to the hospital for evaluation of low hemoglobin, and decompensated diastolic heart failure. She was transfused PRBC 2 units, and started on IV diuretics. Hospital course has been complicated by development of acute kidney injury.   Acute on chronic diastolic heart failure:Managed with IV diuretic regimen-however hospital course complicated by acute kidney injury. Diuretics were held-briefly hydrated. Diuretics were restarted yesterday, weight down to 205 pounds from a peak of 209 pounds on admission. Would avoid metolazone and ACE inhibitor for now, and will continue with torsemide 40 mg daily that was started on 1/16. Follow volume status and weights closely in the outpatient setting. Follow electrolytes-and adjust diuretic regimen as needed.   Acute kidney injury disease stage JME:QASTMH hemodynamically mediated-improving with supportive care.Diuretics have been restarted-continue to follow closely in the outpatient setting.  Severe anemia due to chronic GI bleeding:Status post 2 units of PRBC-hemoglobin currently stable. She does not give a history of melena/hematochezia overt GI bleeding. GI consulted-recommendations are for supportive care, per GI-endoscopic evaluation only indicated when patient develops overt GI bleeding.Continue iron supplementation."  She was transfused. Echo was done showing diastolic heart failure. AKI resolved.   In  March 2019, she was again admitted with acute on chronic diastolic heart failure.  She was also found to be in new onset atrial fibrillation with rapid ventricular response.  She ruled out for MI.  TSH was normal.  She converted back to sinus rhythm on oral metoprolol.  Due to her history of GI bleed, she was not a candidate for anticoagulation.  Atrial fibrillation was likely the trigger that caused her diastolic heart failure.  Telephone note from 6/19: Returned call to patient who states that on Saturday her HR was in the 30s. She states that it was irregular and that she felt lightheaded, dizzy, weak, and the vision in her left eye became dark and fuzzy. She states that this lasted for 2-3 hours. She states that Sunday and Monday her HR was in the 40s. Patient states that she had another episode earlier today where her HR was 30 and the vision in her left eye was blurry. Patient does not have any BP readings. She states that her pulse feels regular when her HR is in the 40s, but it feels irregular when it is in the 30s. Patient states that her HR is 47 now and she is not having any symptoms at this time. Patient takes metoprolol 75 mg BID. This was increased when she was seen in the ER 10/17/17 for Afib RVR. Patient's HR at her last OV on 10/24/17 was 50 bpm in NSR. Per OV note, "HR now 50 bpm in NSR. No room for more rate slowing drugs.If she has more atrial fibrillation with rapid ventricular response, would have to consider electrophysiology consult with possible pacemaker implantation." Made patient aware that we will probably decrease her metoprolol to 50 mg BID, but  I will review with DOD and call her back.  Since metoprolol decreased to 50 mg BID.  She still notes that heart rate can be in the 40s.  She has not had syncope.  She has had occasional lightheadedness.  No bleeding problems.  No chest discomfort.     Past Medical History:  Diagnosis Date  . Angiodysplasia of cecum   .  Angiodysplasia of duodenum   . Asteatotic eczema 02/25/2016  . Atrial fibrillation ([place]) 10/17/2017  . Chest pain at rest 03/22/2010   Qualifier: Diagnosis of  By: Ronnald Ramp MD, Arvid Right.   . CHF (congestive heart failure) ([place]) 08/31/03  . CHF (congestive heart failure), NYHA class III, chronic, combined ([place]) 08/10/2017  . Chronic venous stasis dermatitis of both lower extremities 06/02/2013  . CKD (chronic kidney disease), stage III ([place]) 06/20/2010   Estimated Creatinine Clearance: 34.8 mL/min (A) (by C-G formula based on SCr of 1.37 mg/dL (H)).   Marland Kitchen COLD (chronic obstructive lung disease) ([place])   . Colonic polyp 02/16/2008   Tubular adenoma  . COPD (chronic obstructive pulmonary disease) ([place]) 01/27/98  . Cor pulmonale ([place]) 11/16/2014  . D-dimer, elevated 01/01/2018  . GERD 07/30/1992  . GERD (gastroesophageal reflux disease) 07/30/92  . Gout   . HTN (hypertension) 07/30/1988  . Hyperlipidemia 11/27/96  . Hyperlipidemia with target LDL less than 100 11/27/1996       . Kidney stone 1960, 1972, 1991  . MGUS (monoclonal gammopathy of unknown significance) 11/08/2016  . Morbid obesity ([place]) 04/19/2010   She agrees to work on her lifestyle modifications to help her lose weight.   . Neck pain on right side 01/01/2018  . OSA (obstructive sleep apnea) 06/22/2013  . Osteopenia, senile 02/05/2013   July 2014  -2.1 left femur -1.9 left forearm   . Other screening mammogram 02/05/2013  . Prediabetes 11/13/2006  . Renal insufficiency   . Stenosis of cervical spine with myelopathy ([place]) 01/01/2018  . Ulcer of leg, chronic, right ([place]) 10/10/2011  . Ulcerative colitis   . ULCERATIVE COLITIS-LEFT SIDE 03/19/2008       . Vitamin B12 deficiency anemia 11/13/2006       . Vitamin D deficiency 02/06/2013    Past Surgical History:  Procedure Laterality Date  . BRONCHOSCOPY    . COLONOSCOPY WITH PROPOFOL N/A 02/23/2015   Procedure: COLONOSCOPY WITH PROPOFOL;  Surgeon: Ladene Artist, MD;  Location: [facility];  Service:  Endoscopy;  Laterality: N/A;  . ESOPHAGOGASTRODUODENOSCOPY (EGD) WITH PROPOFOL N/A 02/23/2015   Procedure: ESOPHAGOGASTRODUODENOSCOPY (EGD) WITH PROPOFOL;  Surgeon: Ladene Artist, MD;  Location: [facility];  Service: Endoscopy;  Laterality: N/A;  . NO PAST SURGERIES       Current Outpatient Medications  Medication Sig Dispense Refill  . acetaminophen (TYLENOL) 325 MG tablet Take 650 mg by mouth every 6 (six) hours as needed for moderate pain, fever or headache.    . albuterol (PROAIR HFA) 108 (90 Base) MCG/ACT inhaler Inhale 1-2 puffs into the lungs every 6 (six) hours as needed for wheezing or shortness of breath. 18 g 11  . albuterol (PROVENTIL) (2.5 MG/3ML) 0.083% nebulizer solution Take 3 mLs (2.5 mg total) by nebulization every 6 (six) hours as needed for wheezing or shortness of breath. 150 mL 11  . allopurinol (ZYLOPRIM) 100 MG tablet TAKE 100 mg TABLET BY MOUTH EVERY DAY FOR GOUT 90 tablet 0  . cyanocobalamin 2000 MCG tablet Take 1 tablet (2,000 mcg total) by mouth daily. (Patient taking differently:  Take 2,000 mcg by mouth every evening. ) 90 tablet 3  . Dietary Management Product (VASCULERA) TABS TAKE ONE TABLET BY MOUTH EVERY DAY 90 tablet 1  . ferrous sulfate 325 (65 FE) MG tablet Take 1 tablet (325 mg total) by mouth 2 (two) times daily with a meal. 60 tablet 11  . fluticasone furoate-vilanterol (BREO ELLIPTA) 100-25 MCG/INH AEPB Inhale 1 puff into the lungs daily. 60 each 11  . isosorbide-hydrALAZINE (BIDIL) 20-37.5 MG tablet Take 1 tablet by mouth 3 (three) times daily. 270 tablet 1  . metoprolol tartrate (LOPRESSOR) 50 MG tablet TAKE 1 TABLET(50 MG) BY MOUTH TWICE DAILY 180 tablet 3  . oxyCODONE-acetaminophen (PERCOCET) 7.5-325 MG tablet Take 1 tablet by mouth every 8 (eight) hours as needed. 90 tablet 0  . torsemide (DEMADEX) 20 MG tablet Take 1 tablet (20 mg total) by mouth 2 (two) times daily. 180 tablet 0  . Vitamin D, Ergocalciferol, (DRISDOL) 50000 units CAPS capsule  TAKE ONE CAPSULE BY MOUTH ONCE WEEKLY (Patient taking differently: TAKE 50,000 CAPSULE BY MOUTH ONCE WEEKLY) 12 capsule 1   No current facility-administered medications for this visit.     Allergies:   Celebrex [celecoxib]; Enalapril; Lipitor [atorvastatin]; Amlodipine besylate; Amoxicillin; Codeine sulfate; and Hydrocodone-acetaminophen    Social History:  The patient  reports that she quit smoking about 21 years ago. She has never used smokeless tobacco. She reports that she does not drink alcohol or use drugs.   Family History:  The patient's family history includes Cervical cancer in her sister; Clotting disorder in her sister; Crohn's disease in her brother; Diabetes in her mother and sister; Heart attack in her sister; Heart disease in her brother, brother, mother, and sister; Kidney cancer in her mother; Lung cancer in her sister; Stomach cancer in her mother; Stroke in her father and mother.    ROS:  Please see the history of present illness.   Otherwise, review of systems are positive for lightheadedness.   All other systems are reviewed and negative.    PHYSICAL EXAM: VS:  BP (!) 158/72 (BP Location: Left Arm)   Pulse (!) 48   Ht 5' (1.524 m)   Wt 198 lb 6.4 oz (90 kg)   SpO2 92%   BMI 38.75 kg/m  , BMI Body mass index is 38.75 kg/m. GEN: Well nourished, well developed, in no acute distress  HEENT: normal  Neck: no JVD, carotid bruits, or masses Cardiac: regular, bradycardic; no murmurs, rubs, or gallops,no edema  Respiratory:  clear to auscultation bilaterally, normal work of breathing GI: soft, nontender, nondistended, + BS MS: no deformity or atrophy  Skin: warm and dry, no rash Neuro:  Strength and sensation are intact Psych: euthymic mood, full affect   EKG:   The ekg ordered today demonstrates sinus bradycardia, no ST changes   Recent Labs: 08/08/2017: ALT 11 10/17/2017: B Natriuretic Peptide 324.4; TSH 3.276 10/20/2017: Magnesium 2.6 01/01/2018: BUN 29;  Creatinine, Ser 1.49; Hemoglobin 10.1; Platelets 235.0; Potassium 4.2; Pro B Natriuretic peptide (BNP) 145.0; Sodium 139   Lipid Panel    Component Value Date/Time   CHOL 175 09/04/2016 1553   TRIG 215.0 (H) 09/04/2016 1553   HDL 46.10 09/04/2016 1553   CHOLHDL 4 09/04/2016 1553   VLDL 43.0 (H) 09/04/2016 1553   LDLCALC 93 08/17/2015 1436   LDLDIRECT 107.0 09/04/2016 1553     Other studies Reviewed: Additional studies/ records that were reviewed today with results demonstrating: hospital records as noted above: phone notes reviewed.  ASSESSMENT AND PLAN:  1. Chronic diastolic heart failure/edema: Appears euvolemic.  Avoid excessive salt. 2. AFib: No sx of AFib since she had metoprolol decreased.  However, she did have atrial fibrillation on this dose of metoprolol which prompted her dose to be increased to 75 mg twice daily in the emergency room.  It appears, she has tachybradycardia syndrome.  Will refer to electrophysiology consider pacemaker placement.  For now, continue metoprolol 50 mg twice daily dose.  Not on anticoagulation due to prior GI bleeding. 3. GI bleeds: No further bleeding. 4. HTN: Blood pressure elevated at this time.   Current medicines are reviewed at length with the patient today.  The patient concerns regarding her medicines were addressed.  The following changes have been made:  No change  Labs/ tests ordered today include:   Orders Placed This Encounter  Procedures  . EKG 12-Lead    Recommend 150 minutes/week of aerobic exercise Low fat, low carb, high fiber diet recommended  Disposition:   FU with EP   Signed, Larae Grooms, MD  01/23/2018 3:09 PM    [facility] [address], [place], [place]  [address] Phone: [phone]; Fax: [phone]

## 2018-01-23 NOTE — Patient Instructions (Signed)
Medication Instructions:  Your physician recommends that you continue on your current medications as directed. Please refer to the Current Medication list given to you today.   Labwork: None ordered  Testing/Procedures: None ordered  Follow-Up: You have been referred to to see an EP provider Dr. Curt Bears for Atrial Fibrillation and Bradycardia    Any Other Special Instructions Will Be Listed Below (If Applicable).     If you need a refill on your cardiac medications before your next appointment, please call your pharmacy.

## 2018-02-03 ENCOUNTER — Encounter: Payer: Self-pay | Admitting: Internal Medicine

## 2018-02-03 ENCOUNTER — Ambulatory Visit: Payer: Medicare Other | Admitting: Internal Medicine

## 2018-02-03 ENCOUNTER — Encounter (INDEPENDENT_AMBULATORY_CARE_PROVIDER_SITE_OTHER): Payer: Self-pay

## 2018-02-03 VITALS — BP 142/82 | HR 49 | Ht 60.0 in | Wt 197.0 lb

## 2018-02-03 DIAGNOSIS — I4891 Unspecified atrial fibrillation: Secondary | ICD-10-CM

## 2018-02-03 DIAGNOSIS — I495 Sick sinus syndrome: Secondary | ICD-10-CM | POA: Diagnosis not present

## 2018-02-03 DIAGNOSIS — G4733 Obstructive sleep apnea (adult) (pediatric): Secondary | ICD-10-CM

## 2018-02-03 DIAGNOSIS — I10 Essential (primary) hypertension: Secondary | ICD-10-CM | POA: Diagnosis not present

## 2018-02-03 DIAGNOSIS — Z8774 Personal history of (corrected) congenital malformations of heart and circulatory system: Secondary | ICD-10-CM | POA: Diagnosis not present

## 2018-02-03 MED ORDER — METOPROLOL TARTRATE 25 MG PO TABS: 25.0000 mg | ORAL_TABLET | Freq: Two times a day (BID) | ORAL | 3 refills | Status: DC

## 2018-02-03 NOTE — Patient Instructions (Addendum)
Medication Instructions:  Your physician has recommended you make the following change in your medication:  1.  Decrease your metoprolol tartrate 50 mg -  Take 1/2 tablet (25 mg) by mouth twice a day.  If you have an episode of fast heart rate you may take an additional 1/2 tablet.   Labwork: None ordered.  Testing/Procedures: None ordered.  Follow-Up: Your physician wants you to follow-up in: 4 weeks with an EP APP.  Your physician wants you to follow-up in:  8 weeks with Dr. Rayann Heman.  Any Other Special Instructions Will Be Listed Below (If Applicable).  If you need a refill on your cardiac medications before your next appointment, please call your pharmacy.

## 2018-02-03 NOTE — Progress Notes (Signed)
Electrophysiology Office Note   Date:  02/03/2018   ID:  Jonalyn, Sedlak Jul 29, 1941, MRN [id number]  PCP:  [name], MD  Cardiologist:  Dr Irish Lack Primary Electrophysiologist: Thompson Grayer, MD    CC: bradycardia   History of Present Illness: [name] is a 77 y.o. female who presents today for electrophysiology evaluation.   She is referred by Dr Irish Lack for EP consultation regarding symptomatic bradycardia. She has had rare episodes of afib with RVR over the past 4 years.  She also has bradycardia.  She has symptoms of fatigue, dizziness, and decreased exercise tolerance with her afib.  She has recently had her metoprolol reduce to 76m BID.  She does not feel any better.  She continues to have heart rates 30s-40s at home.   She is not on anticoagulated due to prior GI bleeding.  Today, she denies symptoms of palpitations, chest pain, shortness of breath, orthopnea, PND, lower extremity edema, claudication, presyncope, syncope, bleeding, or neurologic sequela. The patient is tolerating medications without difficulties and is otherwise without complaint today.    Past Medical History:  Diagnosis Date  . Angiodysplasia of cecum   . Angiodysplasia of duodenum   . Asteatotic eczema 02/25/2016  . Atrial fibrillation ([place] 10/17/2017  . Chest pain at rest 03/22/2010   Qualifier: Diagnosis of  By: JRonnald RampMD, TArvid Right   . CHF (congestive heart failure) ([place] 08/31/03  . CHF (congestive heart failure), NYHA class III, chronic, combined ([place] 08/10/2017  . Chronic venous stasis dermatitis of both lower extremities 06/02/2013  . CKD (chronic kidney disease), stage III ([place] 06/20/2010   Estimated Creatinine Clearance: 34.8 mL/min (A) (by C-G formula based on SCr of 1.37 mg/dL (H)).   .Marland KitchenCOLD (chronic obstructive lung disease) ([place]   . Colonic polyp 02/16/2008   Tubular adenoma  . COPD (chronic obstructive pulmonary disease) ([place] 01/27/98  . Cor pulmonale ([place] 11/16/2014  .  D-dimer, elevated 01/01/2018  . GERD 07/30/1992  . GERD (gastroesophageal reflux disease) 07/30/92  . Gout   . HTN (hypertension) 07/30/1988  . Hyperlipidemia 11/27/96  . Hyperlipidemia with target LDL less than 100 11/27/1996       . Kidney stone 1960, 1972, 1991  . MGUS (monoclonal gammopathy of unknown significance) 11/08/2016  . Morbid obesity ([place] 04/19/2010   She agrees to work on her lifestyle modifications to help her lose weight.   . Neck pain on right side 01/01/2018  . OSA (obstructive sleep apnea) 06/22/2013  . Osteopenia, senile 02/05/2013   July 2014  -2.1 left femur -1.9 left forearm   . Other screening mammogram 02/05/2013  . Prediabetes 11/13/2006  . Renal insufficiency   . Stenosis of cervical spine with myelopathy ([place] 01/01/2018  . Ulcer of leg, chronic, right ([place] 10/10/2011  . Ulcerative colitis   . ULCERATIVE COLITIS-LEFT SIDE 03/19/2008       . Vitamin B12 deficiency anemia 11/13/2006       . Vitamin D deficiency 02/06/2013   Past Surgical History:  Procedure Laterality Date  . BRONCHOSCOPY    . COLONOSCOPY WITH PROPOFOL N/A 02/23/2015   Procedure: COLONOSCOPY WITH PROPOFOL;  Surgeon: MLadene Artist MD;  [facility];  Service: Endoscopy;  Laterality: N/A;  . ESOPHAGOGASTRODUODENOSCOPY (EGD) WITH PROPOFOL N/A 02/23/2015   Procedure: ESOPHAGOGASTRODUODENOSCOPY (EGD) WITH PROPOFOL;  Surgeon: MLadene Artist MD;  [facility];  Service: Endoscopy;  Laterality: N/A;  . NO PAST SURGERIES       Current  Outpatient Medications  Medication Sig Dispense Refill  . acetaminophen (TYLENOL) 325 MG tablet Take 650 mg by mouth every 6 (six) hours as needed for moderate pain, fever or headache.    . albuterol (PROAIR HFA) 108 (90 Base) MCG/ACT inhaler Inhale 1-2 puffs into the lungs every 6 (six) hours as needed for wheezing or shortness of breath. 18 g 11  . albuterol (PROVENTIL) (2.5 MG/3ML) 0.083% nebulizer solution Take 3 mLs (2.5 mg total) by nebulization every 6  (six) hours as needed for wheezing or shortness of breath. 150 mL 11  . allopurinol (ZYLOPRIM) 100 MG tablet TAKE 100 mg TABLET BY MOUTH EVERY DAY FOR GOUT 90 tablet 0  . cyanocobalamin 2000 MCG tablet Take 1 tablet (2,000 mcg total) by mouth daily. (Patient taking differently: Take 2,000 mcg by mouth every evening. ) 90 tablet 3  . Dietary Management Product (VASCULERA) TABS TAKE ONE TABLET BY MOUTH EVERY DAY 90 tablet 1  . ferrous sulfate 325 (65 FE) MG tablet Take 1 tablet (325 mg total) by mouth 2 (two) times daily with a meal. 60 tablet 11  . fluticasone furoate-vilanterol (BREO ELLIPTA) 100-25 MCG/INH AEPB Inhale 1 puff into the lungs daily. 60 each 11  . isosorbide-hydrALAZINE (BIDIL) 20-37.5 MG tablet Take 1 tablet by mouth 3 (three) times daily. 270 tablet 1  . oxyCODONE-acetaminophen (PERCOCET) 7.5-325 MG tablet Take 1 tablet by mouth every 8 (eight) hours as needed. 90 tablet 0  . torsemide (DEMADEX) 20 MG tablet Take 40 mg by mouth daily.    . Vitamin D, Ergocalciferol, (DRISDOL) 50000 units CAPS capsule TAKE ONE CAPSULE BY MOUTH ONCE WEEKLY (Patient taking differently: TAKE 50,000 CAPSULE BY MOUTH ONCE WEEKLY) 12 capsule 1  . metoprolol tartrate (LOPRESSOR) 25 MG tablet Take 1 tablet (25 mg total) by mouth 2 (two) times daily. 180 tablet 3   No current facility-administered medications for this visit.     Allergies:   Celebrex [celecoxib]; Enalapril; Lipitor [atorvastatin]; Amlodipine besylate; Amoxicillin; Codeine sulfate; and Hydrocodone-acetaminophen   Social History:  The patient  reports that she quit smoking about 21 years ago. She has never used smokeless tobacco. She reports that she does not drink alcohol or use drugs.   Family History:  The patient's  family history includes Cervical cancer in her sister; Clotting disorder in her sister; Crohn's disease in her brother; Diabetes in her mother and sister; Heart attack in her sister; Heart disease in her brother, brother,  mother, and sister; Kidney cancer in her mother; Lung cancer in her sister; Stomach cancer in her mother; Stroke in her father and mother.    ROS:  Please see the history of present illness.   All other systems are personally reviewed and negative.    PHYSICAL EXAM: VS:  BP (!) 142/82   Pulse (!) 49   Ht 5' (1.524 m)   Wt 197 lb (89.4 kg)   BMI 38.47 kg/m  , BMI Body mass index is 38.47 kg/m.  GEN: Well nourished, well developed, in no acute distress  HEENT: normal  Neck: no JVD, carotid bruits, or masses Cardiac: bradycardic regular rhythm; no murmurs, rubs, or gallops,no edema  Respiratory:  clear to auscultation bilaterally, normal work of breathing GI: soft, nontender, nondistended, + BS MS: no deformity or atrophy  Skin: warm and dry  Neuro:  Strength and sensation are intact Psych: euthymic mood, full affect  EKG:  EKG is ordered today. The ekg ordered today is personally reviewed and shows sinus bradycardia  49 bpm, PR 216 msec, QRS 96 msec, Qtc 393 msec   Recent Labs: 08/08/2017: ALT 11 10/17/2017: B Natriuretic Peptide 324.4; TSH 3.276 10/20/2017: Magnesium 2.6 01/01/2018: BUN 29; Creatinine, Ser 1.49; Hemoglobin 10.1; Platelets 235.0; Potassium 4.2; Pro B Natriuretic peptide (BNP) 145.0; Sodium 139  personally reviewed   Lipid Panel     Component Value Date/Time   CHOL 175 09/04/2016 1553   TRIG 215.0 (H) 09/04/2016 1553   HDL 46.10 09/04/2016 1553   CHOLHDL 4 09/04/2016 1553   VLDL 43.0 (H) 09/04/2016 1553   LDLCALC 93 08/17/2015 1436   LDLDIRECT 107.0 09/04/2016 1553   personally reviewed   Wt Readings from Last 3 Encounters:  02/03/18 197 lb (89.4 kg)  01/23/18 198 lb 6.4 oz (90 kg)  01/01/18 202 lb (91.6 kg)      Other studies personally reviewed: Additional studies/ records that were reviewed today include: echo, Dr Hassell Done notes, prior hospital records  Review of the above records today demonstrates: as above   ASSESSMENT AND PLAN:  1.   Tachycardia/ bradycardia syndrome with symptomatic sinus bradycardia. The patient has symptomatic sinus bradycardia.  I advised pacemaker implantation at this time. She is clear that she is not interested in PPM implant. She would prefer ongoing medical management. I will therefore reduce metoprolol to 61m BID today.  She can take additional metoprolol 262mprn afib with RVR. Return to see EP APP in 4 weeks.  If still bradycardic, would reduce metoprolol to 12.21m721mID at that time.  2. Paroxysmal atrial fibrillation Medical therapy is limited by bradycardia As above She will take prn metoprolol for RVR chads2vasc score is at least 4.  Not felt to be a candidate for anticoagulation due to bradycardia  3. OSA Compliance with therapy advised  4. HTN Stable No change required today  Follow-up with EP APP in 4 weeks for further management of bradycardia as above I will see again in 8 weeks  Current medicines are reviewed at length with the patient today.   The patient does not have concerns regarding her medicines.  The following changes were made today:  none  Labs/ tests ordered today include:  Orders Placed This Encounter  Procedures  . EKG 12-Lead     Signed, JamThompson GrayerD  02/03/2018 10:55 AM     [facility] [address]  [phone])

## 2018-02-04 ENCOUNTER — Encounter: Payer: Self-pay | Admitting: Internal Medicine

## 2018-02-04 ENCOUNTER — Ambulatory Visit: Payer: Medicare Other | Admitting: Internal Medicine

## 2018-02-04 ENCOUNTER — Other Ambulatory Visit (INDEPENDENT_AMBULATORY_CARE_PROVIDER_SITE_OTHER): Payer: Medicare Other

## 2018-02-04 VITALS — BP 154/50 | HR 51 | Temp 98.0°F | Ht 60.0 in | Wt 198.0 lb

## 2018-02-04 DIAGNOSIS — D500 Iron deficiency anemia secondary to blood loss (chronic): Secondary | ICD-10-CM

## 2018-02-04 DIAGNOSIS — I5042 Chronic combined systolic (congestive) and diastolic (congestive) heart failure: Secondary | ICD-10-CM

## 2018-02-04 DIAGNOSIS — E785 Hyperlipidemia, unspecified: Secondary | ICD-10-CM

## 2018-02-04 DIAGNOSIS — N183 Chronic kidney disease, stage 3 unspecified: Secondary | ICD-10-CM

## 2018-02-04 DIAGNOSIS — D510 Vitamin B12 deficiency anemia due to intrinsic factor deficiency: Secondary | ICD-10-CM | POA: Diagnosis not present

## 2018-02-04 DIAGNOSIS — D509 Iron deficiency anemia, unspecified: Secondary | ICD-10-CM | POA: Diagnosis not present

## 2018-02-04 LAB — CBC WITH DIFFERENTIAL/PLATELET
BASOS PCT: 0.4 % (ref 0.0–3.0)
Basophils Absolute: 0 10*3/uL (ref 0.0–0.1)
Eosinophils Absolute: 0.2 10*3/uL (ref 0.0–0.7)
Eosinophils Relative: 2.3 % (ref 0.0–5.0)
HEMATOCRIT: 29.9 % — AB (ref 36.0–46.0)
Hemoglobin: 10.1 g/dL — ABNORMAL LOW (ref 12.0–15.0)
LYMPHS PCT: 14.4 % (ref 12.0–46.0)
Lymphs Abs: 1.3 10*3/uL (ref 0.7–4.0)
MCHC: 33.8 g/dL (ref 30.0–36.0)
MCV: 79.2 fl (ref 78.0–100.0)
Monocytes Absolute: 0.9 10*3/uL (ref 0.1–1.0)
Monocytes Relative: 10.1 % (ref 3.0–12.0)
NEUTROS ABS: 6.3 10*3/uL (ref 1.4–7.7)
Neutrophils Relative %: 72.8 % (ref 43.0–77.0)
PLATELETS: 195 10*3/uL (ref 150.0–400.0)
RBC: 3.78 Mil/uL — ABNORMAL LOW (ref 3.87–5.11)
RDW: 14.1 % (ref 11.5–15.5)
WBC: 8.7 10*3/uL (ref 4.0–10.5)

## 2018-02-04 LAB — LIPID PANEL
CHOL/HDL RATIO: 3
Cholesterol: 151 mg/dL (ref 0–200)
HDL: 56.3 mg/dL (ref >39.00)
LDL Cholesterol: 78 mg/dL (ref 0–99)
NONHDL: 94.92
TRIGLYCERIDES: 85 mg/dL (ref 0.0–149.0)
VLDL: 17 mg/dL (ref 0.0–40.0)

## 2018-02-04 LAB — BASIC METABOLIC PANEL
BUN: 33 mg/dL — AB (ref 6–23)
CALCIUM: 9.8 mg/dL (ref 8.4–10.5)
CO2: 35 meq/L — AB (ref 19–32)
CREATININE: 1.36 mg/dL — AB (ref 0.40–1.20)
Chloride: 99 mEq/L (ref 96–112)
GFR: 40.02 mL/min — ABNORMAL LOW (ref >60.00)
Glucose, Bld: 107 mg/dL — ABNORMAL HIGH (ref 70–99)
Potassium: 4.3 mEq/L (ref 3.5–5.1)
Sodium: 139 mEq/L (ref 135–145)

## 2018-02-04 NOTE — Progress Notes (Signed)
Subjective:  Patient ID: [name], female    DOB: 09-16-1940  Age: 77 y.o. MRN: [id number]  CC: Anemia   HPI [name] presents for f/up on anemia.  She continues to be symptomatic with episodes of dizziness, lightheadedness, and near syncope.  Her pulse was down into the 30s one day prior to this visit.  She recently saw an electrophysiologist and it was recommended that she have a permanent pacemaker placed.  She has not committed to this yet but she does see the electrophysiologist again in about a month.  Outpatient Medications Prior to Visit  Medication Sig Dispense Refill  . acetaminophen (TYLENOL) 325 MG tablet Take 650 mg by mouth every 6 (six) hours as needed for moderate pain, fever or headache.    . albuterol (PROAIR HFA) 108 (90 Base) MCG/ACT inhaler Inhale 1-2 puffs into the lungs every 6 (six) hours as needed for wheezing or shortness of breath. 18 g 11  . albuterol (PROVENTIL) (2.5 MG/3ML) 0.083% nebulizer solution Take 3 mLs (2.5 mg total) by nebulization every 6 (six) hours as needed for wheezing or shortness of breath. 150 mL 11  . allopurinol (ZYLOPRIM) 100 MG tablet TAKE 100 mg TABLET BY MOUTH EVERY DAY FOR GOUT 90 tablet 0  . cyanocobalamin 2000 MCG tablet Take 1 tablet (2,000 mcg total) by mouth daily. (Patient taking differently: Take 2,000 mcg by mouth every evening. ) 90 tablet 3  . Dietary Management Product (VASCULERA) TABS TAKE ONE TABLET BY MOUTH EVERY DAY 90 tablet 1  . fluticasone furoate-vilanterol (BREO ELLIPTA) 100-25 MCG/INH AEPB Inhale 1 puff into the lungs daily. 60 each 11  . isosorbide-hydrALAZINE (BIDIL) 20-37.5 MG tablet Take 1 tablet by mouth 3 (three) times daily. 270 tablet 1  . metoprolol tartrate (LOPRESSOR) 25 MG tablet Take 1 tablet (25 mg total) by mouth 2 (two) times daily. 180 tablet 3  . torsemide (DEMADEX) 20 MG tablet Take 40 mg by mouth daily.    . Vitamin D, Ergocalciferol, (DRISDOL) 50000 units CAPS capsule TAKE ONE CAPSULE  BY MOUTH ONCE WEEKLY (Patient taking differently: TAKE 50,000 CAPSULE BY MOUTH ONCE WEEKLY) 12 capsule 1  . ferrous sulfate 325 (65 FE) MG tablet Take 1 tablet (325 mg total) by mouth 2 (two) times daily with a meal. 60 tablet 11  . oxyCODONE-acetaminophen (PERCOCET) 7.5-325 MG tablet Take 1 tablet by mouth every 8 (eight) hours as needed. 90 tablet 0   No facility-administered medications prior to visit.     ROS Review of Systems  Constitutional: Positive for fatigue. Negative for diaphoresis.  HENT: Negative.  Negative for trouble swallowing and voice change.   Eyes: Negative.  Negative for visual disturbance.  Respiratory: Positive for shortness of breath. Negative for cough, chest tightness and wheezing.   Cardiovascular: Negative for chest pain, palpitations and leg swelling.  Gastrointestinal: Negative for abdominal pain, anal bleeding, blood in stool, diarrhea, nausea and vomiting.  Endocrine: Negative.   Genitourinary: Negative.  Negative for difficulty urinating.  Musculoskeletal: Negative.  Negative for arthralgias and myalgias.  Skin: Negative.  Negative for color change, pallor and rash.  Neurological: Positive for dizziness and light-headedness. Negative for syncope.       +near-syncope  Hematological: Negative for adenopathy. Does not bruise/bleed easily.  Psychiatric/Behavioral: Negative.  Negative for suicidal ideas.    Objective:  BP (!) 154/50 (BP Location: Left Arm, Patient Position: Sitting, Cuff Size: Large)   Pulse (!) 51   Temp 98 F (36.7 C) (Oral)  Ht 5' (1.524 m)   Wt 198 lb (89.8 kg)   SpO2 92%   BMI 38.67 kg/m   BP Readings from Last 3 Encounters:  02/04/18 (!) 154/50  02/03/18 (!) 142/82  01/23/18 (!) 158/72    Wt Readings from Last 3 Encounters:  02/04/18 198 lb (89.8 kg)  02/03/18 197 lb (89.4 kg)  01/23/18 198 lb 6.4 oz (90 kg)    Physical Exam  Constitutional: She is oriented to person, place, and time. No distress.  HENT:    Mouth/Throat: Oropharynx is clear and moist. No oropharyngeal exudate.  Eyes: Conjunctivae are normal. No scleral icterus.  Neck: Normal range of motion. Neck supple. No JVD present. No thyromegaly present.  Cardiovascular: Regular rhythm and S2 normal. Bradycardia present. Exam reveals no gallop.  Murmur heard.  Systolic murmur is present with a grade of 2/6.  No diastolic murmur is present. Pulmonary/Chest: Effort normal and breath sounds normal. No respiratory distress. She has no decreased breath sounds. She has no wheezes. She has no rhonchi. She has no rales.  Abdominal: Soft. Normal appearance and bowel sounds are normal. She exhibits no mass. There is no hepatosplenomegaly. There is no tenderness.  Musculoskeletal: Normal range of motion. She exhibits no edema, tenderness or deformity.  Lymphadenopathy:    She has no cervical adenopathy.  Neurological: She is alert and oriented to person, place, and time.  Skin: Skin is warm and dry. She is not diaphoretic. No pallor.  Vitals reviewed.   Lab Results  Component Value Date   WBC 8.7 02/04/2018   HGB 10.1 (L) 02/04/2018   HCT 29.9 (L) 02/04/2018   PLT 195.0 02/04/2018   GLUCOSE 107 (H) 02/04/2018   CHOL 151 02/04/2018   TRIG 85.0 02/04/2018   HDL 56.30 02/04/2018   LDLDIRECT 107.0 09/04/2016   LDLCALC 78 02/04/2018   ALT 11 (L) 08/08/2017   AST 17 08/08/2017   NA 139 02/04/2018   K 4.3 02/04/2018   CL 99 02/04/2018   CREATININE 1.36 (H) 02/04/2018   BUN 33 (H) 02/04/2018   CO2 35 (H) 02/04/2018   TSH 3.276 10/17/2017   INR 1.07 08/08/2017   HGBA1C 5.4 11/05/2017   MICROALBUR <0.7 11/05/2017    Dg Chest 2 View  Result Date: 01/01/2018 CLINICAL DATA:  Right shoulder and scapular pain for the past 2 weeks. Occasional cough, chest congestion, and shortness of breath. History of asthma-COPD, CHF, cor pulmonale, former smoker. EXAM: CHEST - 2 VIEW COMPARISON:  PA and lateral chest x-ray of October 17, 2017 FINDINGS: The  lungs are adequately inflated. There is no focal infiltrate. There is stable biapical pleural thickening. There is a small left pleural effusion which has increased slightly in size since the previous study. The heart is mildly enlarged. The pulmonary vascularity is not engorged. There is calcification in the wall of the aortic arch. The mediastinum is normal in width. The bony thorax is unremarkable. There is mild degenerative change of the right AC joint and there is narrowing of the subacromial subdeltoid space of the right shoulder. IMPRESSION: Chronic bronchitic-reactive airway changes. No alveolar pneumonia. Small left pleural effusion more conspicuous than on the previous study. Cardiomegaly without pulmonary vascular congestion. Thoracic aortic atherosclerosis. Degenerative changes of the right shoulder. Electronically Signed   By: David  Martinique M.D.   On: 01/01/2018 09:32   Dg Cervical Spine Complete  Result Date: 01/01/2018 CLINICAL DATA:  History of right-sided neck pain radiating into the right shoulder. EXAM: CERVICAL SPINE -  COMPLETE 4+ VIEW COMPARISON:  Lateral view of the cervical spine from a skeletal survey of March 24, 2013 FINDINGS: The bones are subjectively osteopenic. There is approximately 3 mm of grade 1 anterolisthesis of C5 with respect to C6 that is slightly more conspicuous than on the study of 2014. There is 1-2 mm of anterolisthesis of C4 with respect to C5 which is new since the 2014 study. There is no compression fracture. There is mild disc space narrowing at C6-7 and to a lesser extent at C4-5 and C5-6. The odontoid is intact. The prevertebral soft tissue spaces are mildly prominent but stable. The oblique views reveal moderate bony encroachment upon the mid cervical neural foramina on the right. IMPRESSION: Increased conspicuity of grade 1 anterolisthesis of C5 with respect to C6 with degree of slippage of 3 mm. There is minimal C4-5 anterolisthesis amounting to 1-2 mm which  is new. There is bony encroachment upon the neural foramina in the mid cervical spine on the right. No compression fracture. Mild degenerative disc space narrowing at C4-5, 5-6, and 6-7. Electronically Signed   By: David  Martinique M.D.   On: 01/01/2018 09:36   Ct Angio Chest W/cm &/or Wo Cm  Result Date: 01/01/2018 CLINICAL DATA:  Right neck and shoulder pain for 2 weeks, shortness of breath with exertion. History of COPD, CHF. EXAM: CT ANGIOGRAPHY CHEST WITH CONTRAST TECHNIQUE: Multidetector CT imaging of the chest was performed using the standard protocol during bolus administration of intravenous contrast. Multiplanar CT image reconstructions and MIPs were obtained to evaluate the vascular anatomy. CONTRAST:  5m ISOVUE-370 IOPAMIDOL (ISOVUE-370) INJECTION 76% COMPARISON:  Chest radiograph January 01, 2018 FINDINGS: CARDIOVASCULAR: Adequate contrast opacification of the pulmonary artery's. Main pulmonary artery is enlarged at 3.5 cm. No pulmonary arterial filling defects to the level of the subsegmental branches. The heart is moderately enlarged. Mild coronary artery calcifications. No pericardial effusion. Thoracic aorta is normal course and caliber, advance calcific atherosclerosis aortic arch. MEDIASTINUM/NODES: No lymphadenopathy by CT size criteria. LUNGS/PLEURA: Tracheobronchial tree is patent, no pneumothorax. Enhancing bibasilar atelectasis. UPPER ABDOMEN: Nonacute. 2.2 cm homogeneously hypodense benign-appearing cyst upper pole right kidney. Colonic interposition. Small right fat containing diaphragmatic hernia. Mild centrilobular emphysema. 4 mm ground-glass nodule right upper lobe, 3 mm subsolid pulmonary nodule left upper lobe (series 6, image 24). No pleural effusion. MUSCULOSKELETAL: Nonacute. 12 mm right thyroid nodule below size follow-up recommendations. Review of the MIP images confirms the above findings. IMPRESSION: 1. No acute pulmonary embolism. Enlarged pulmonary arteries seen with chronic  pulmonary arterial hypertension. 2. Moderate cardiomegaly.  No acute pulmonary process. 3. **An incidental finding of potential clinical significance has been found. Two subsolid pulmonary nodules measuring less than 5 mm. Non-contrast chest CT at 3-6 months is recommended. If nodules persist and are stable at that time, consider additional non-contrast chest CT examinations at 2 and 4 years. This recommendation follows the consensus statement: Guidelines for Management of Incidental Pulmonary Nodules Detected on CT Images: From the Fleischner Society 2017; Radiology 2017; 284:228-243.** Aortic Atherosclerosis (ICD10-I70.0) and Emphysema (ICD10-J43.9). Electronically Signed   By: CElon AlasM.D.   On: 01/01/2018 16:14    Assessment & Plan:   BHarleenwas seen today for anemia.  Diagnoses and all orders for this visit:  CKD (chronic kidney disease), stage III ([place]- Her renal function is stable.  She will avoid nephrotoxic agents. -     Basic metabolic panel; Future  Iron deficiency anemia due to chronic blood loss- Her H&H are not  improving.  I have asked her to restart iron replacement therapy. -     CBC with Differential/Platelet; Future -     ferrous sulfate 325 (65 FE) MG tablet; Take 1 tablet (325 mg total) by mouth 2 (two) times daily with a meal.  Vitamin B12 deficiency anemia due to intrinsic factor deficiency- cont B12 replacement therapy -     CBC with Differential/Platelet; Future  Hyperlipidemia with target LDL less than 100- She has not achieved her LDL goal and she has a significantly elevated ASCVD risk score so I have asked her to start taking a statin for CV risk reduction. -     Lipid panel; Future  Anemia, iron deficiency  CHF (congestive heart failure), NYHA class III, chronic, combined ([place])- I have encouraged her to proceed with the permanent pacemaker.   I have discontinued Larry Sierras. [place]'s oxyCODONE-acetaminophen. I am also having her start on Pitavastatin  Calcium. Additionally, I am having her maintain her cyanocobalamin, albuterol, fluticasone furoate-vilanterol, albuterol, acetaminophen, Vitamin D (Ergocalciferol), isosorbide-hydrALAZINE, allopurinol, VASCULERA, torsemide, metoprolol tartrate, and ferrous sulfate.  Meds ordered this encounter  Medications  . ferrous sulfate 325 (65 FE) MG tablet    Sig: Take 1 tablet (325 mg total) by mouth 2 (two) times daily with a meal.    Dispense:  180 tablet    Refill:  1  . Pitavastatin Calcium (LIVALO) 1 MG TABS    Sig: Take 1 tablet (1 mg total) by mouth daily.    Dispense:  90 tablet    Refill:  1     Follow-up: No follow-ups on file.  [name], MD

## 2018-02-05 ENCOUNTER — Encounter: Payer: Self-pay | Admitting: Internal Medicine

## 2018-02-05 MED ORDER — PITAVASTATIN CALCIUM 1 MG PO TABS: 1.0000 | ORAL_TABLET | Freq: Every day | ORAL | 1 refills | Status: DC

## 2018-02-05 MED ORDER — FERROUS SULFATE 325 (65 FE) MG PO TABS: 325.0000 mg | ORAL_TABLET | Freq: Two times a day (BID) | ORAL | 1 refills | Status: DC

## 2018-02-05 NOTE — Patient Instructions (Signed)
Iron Deficiency Anemia, Adult Iron deficiency anemia is a condition in which the concentration of red blood cells or hemoglobin in the blood is below normal because of too little iron. Hemoglobin is a substance in red blood cells that carries oxygen to the body's tissues. When the concentration of red blood cells or hemoglobin is too low, not enough oxygen reaches these tissues. Iron deficiency anemia is usually long-lasting (chronic) and it develops over time. It may or may not cause symptoms. It is a common type of anemia. What are the causes? This condition may be caused by:  Not enough iron in the diet.  Blood loss caused by bleeding in the intestine.  Blood loss from a gastrointestinal condition like Crohn disease.  Frequent blood draws, such as from blood donation.  Abnormal absorption in the gut.  Heavy menstrual periods in women.  Cancers of the gastrointestinal system, such as colon cancer.  What are the signs or symptoms? Symptoms of this condition may include:  Fatigue.  Headache.  Pale skin, lips, and nail beds.  Poor appetite.  Weakness.  Shortness of breath.  Dizziness.  Cold hands and feet.  Fast or irregular heartbeat.  Irritability. This is more common in severe anemia.  Rapid breathing. This is more common in severe anemia.  Mild anemia may not cause any symptoms. How is this diagnosed? This condition is diagnosed based on:  Your medical history.  A physical exam.  Blood tests.  You may have additional tests to find the underlying cause of your anemia, such as:  Testing for blood in the stool (fecal occult blood test).  A procedure to see inside your colon and rectum (colonoscopy).  A procedure to see inside your esophagus and stomach (endoscopy).  A test in which cells are removed from bone marrow (bone marrow aspiration) or fluid is removed from the bone marrow to be examined (biopsy). This is rarely needed.  How is this  treated? This condition is treated by correcting the cause of your iron deficiency. Treatment may involve:  Adding iron-rich foods to your diet.  Taking iron supplements. If you are pregnant or breastfeeding, you may need to take extra iron because your normal diet usually does not provide the amount of iron that you need.  Increasing vitamin C intake. Vitamin C helps your body absorb iron. Your health care provider may recommend that you take iron supplements along with a glass of orange juice or a vitamin C supplement.  Medicines to make heavy menstrual flow lighter.  Surgery.  You may need repeat blood tests to determine whether treatment is working. Depending on the underlying cause, the anemia should be corrected within 2 months of starting treatment. If the treatment does not seem to be working, you may need more testing. Follow these instructions at home: Medicines  Take over-the-counter and prescription medicines only as told by your health care provider. This includes iron supplements and vitamins.  If you cannot tolerate taking iron supplements by mouth, talk with your health care provider about taking them through a vein (intravenously) or an injection into a muscle.  For the best iron absorption, you should take iron supplements when your stomach is empty. If you cannot tolerate them on an empty stomach, you may need to take them with food.  Do not drink milk or take antacids at the same time as your iron supplements. Milk and antacids may interfere with iron absorption.  Iron supplements can cause constipation. To prevent constipation, include fiber   in your diet as told by your health care provider. A stool softener may also be recommended. Eating and drinking  Talk with your health care provider before changing your diet. He or she may recommend that you eat foods that contain a lot of iron, such as: ? Liver. ? Low-fat (lean) beef. ? Breads and cereals that have iron  added to them (are fortified). ? Eggs. ? Dried fruit. ? Dark green, leafy vegetables.  To help your body use the iron from iron-rich foods, eat those foods at the same time as fresh fruits and vegetables that are high in vitamin C. Foods that are high in vitamin C include: ? Oranges. ? Peppers. ? Tomatoes. ? Mangoes.  Drinkenoughfluid to keep your urine clear or pale yellow. General instructions  Return to your normal activities as told by your health care provider. Ask your health care provider what activities are safe for you.  Practice good hygiene. Anemia can make you more prone to illness and infection.  Keep all follow-up visits as told by your health care provider. This is important. Contact a health care provider if:  You feel nauseous or you vomit.  You feel weak.  You have unexplained sweating.  You develop symptoms of constipation, such as: ? Having fewer than three bowel movements a week. ? Straining to have a bowel movement. ? Having stools that are hard, dry, or larger than normal. ? Feeling full or bloated. ? Pain in the lower abdomen. ? Not feeling relief after having a bowel movement. Get help right away if:  You faint. If this happens, do not drive yourself to the hospital. Call your local emergency services (911 in the [place]  You have chest pain.  You have shortness of breath that: ? Is severe. ? Gets worse with physical activity.  You have a rapid heartbeat.  You become light-headed when getting up from a sitting or lying down position. This information is not intended to replace advice given to you by your health care provider. Make sure you discuss any questions you have with your health care provider. Document Released: 07/13/2000 Document Revised: 04/04/2016 Document Reviewed: 04/04/2016 Elsevier Interactive Patient Education  2018 Elsevier Inc.  

## 2018-02-20 ENCOUNTER — Ambulatory Visit: Payer: Medicare Other | Admitting: Interventional Cardiology

## 2018-02-20 ENCOUNTER — Other Ambulatory Visit: Payer: Self-pay | Admitting: Internal Medicine

## 2018-02-21 DIAGNOSIS — J449 Chronic obstructive pulmonary disease, unspecified: Secondary | ICD-10-CM | POA: Diagnosis not present

## 2018-02-24 ENCOUNTER — Other Ambulatory Visit: Payer: Self-pay | Admitting: Internal Medicine

## 2018-03-03 ENCOUNTER — Ambulatory Visit: Payer: Medicare Other | Admitting: Internal Medicine

## 2018-03-03 ENCOUNTER — Encounter: Payer: Self-pay | Admitting: Internal Medicine

## 2018-03-03 VITALS — BP 132/60 | HR 64 | Ht 60.0 in | Wt 201.4 lb

## 2018-03-03 DIAGNOSIS — I495 Sick sinus syndrome: Secondary | ICD-10-CM | POA: Diagnosis not present

## 2018-03-03 DIAGNOSIS — I480 Paroxysmal atrial fibrillation: Secondary | ICD-10-CM

## 2018-03-03 DIAGNOSIS — G4733 Obstructive sleep apnea (adult) (pediatric): Secondary | ICD-10-CM | POA: Diagnosis not present

## 2018-03-03 DIAGNOSIS — R001 Bradycardia, unspecified: Secondary | ICD-10-CM

## 2018-03-03 DIAGNOSIS — I10 Essential (primary) hypertension: Secondary | ICD-10-CM | POA: Diagnosis not present

## 2018-03-03 MED ORDER — METOPROLOL TARTRATE 25 MG PO TABS: 12.5000 mg | ORAL_TABLET | Freq: Two times a day (BID) | ORAL | 3 refills | Status: DC

## 2018-03-03 NOTE — Patient Instructions (Addendum)
Medication Instructions:  Your physician has recommended you make the following change in your medication: 1.  Reduce your metoprolol 25 mg- Take 1/2 tablet by mouth twice a day.  Labwork: None ordered.  Testing/Procedures: None ordered.  Follow-Up: Your physician wants you to follow-up in: 3 months with Tommye Standard, PA.  Any Other Special Instructions Will Be Listed Below (If Applicable).  If you need a refill on your cardiac medications before your next appointment, please call your pharmacy.

## 2018-03-03 NOTE — Progress Notes (Signed)
PCP: [name], MD Primary Cardiologist: Dr Irish Lack Primary EP: Dr Rayann Heman  [name] is a 77 y.o. female who presents today for routine electrophysiology followup.  Since last being seen in our clinic, the patient reports doing very well.  Today, she denies symptoms of palpitations, chest pain, shortness of breath,  lower extremity edema, dizziness, presyncope, or syncope.  The patient is otherwise without complaint today.   Past Medical History:  Diagnosis Date  . Angiodysplasia of cecum   . Angiodysplasia of duodenum   . Asteatotic eczema 02/25/2016  . Atrial fibrillation ([place]) 10/17/2017  . Chest pain at rest 03/22/2010   Qualifier: Diagnosis of  By: Ronnald Ramp MD, Arvid Right.   . CHF (congestive heart failure) ([place]) 08/31/03  . CHF (congestive heart failure), NYHA class III, chronic, combined ([place]) 08/10/2017  . Chronic venous stasis dermatitis of both lower extremities 06/02/2013  . CKD (chronic kidney disease), stage III ([place]) 06/20/2010   Estimated Creatinine Clearance: 34.8 mL/min (A) (by C-G formula based on SCr of 1.37 mg/dL (H)).   Marland Kitchen COLD (chronic obstructive lung disease) ([place])   . Colonic polyp 02/16/2008   Tubular adenoma  . COPD (chronic obstructive pulmonary disease) ([place]) 01/27/98  . Cor pulmonale ([place]) 11/16/2014  . D-dimer, elevated 01/01/2018  . GERD 07/30/1992  . GERD (gastroesophageal reflux disease) 07/30/92  . Gout   . HTN (hypertension) 07/30/1988  . Hyperlipidemia 11/27/96  . Hyperlipidemia with target LDL less than 100 11/27/1996       . Kidney stone 1960, 1972, 1991  . MGUS (monoclonal gammopathy of unknown significance) 11/08/2016  . Morbid obesity ([place]) 04/19/2010   She agrees to work on her lifestyle modifications to help her lose weight.   . Neck pain on right side 01/01/2018  . OSA (obstructive sleep apnea) 06/22/2013  . Osteopenia, senile 02/05/2013   July 2014  -2.1 left femur -1.9 left forearm   . Other screening mammogram 02/05/2013  . Prediabetes 11/13/2006   . Renal insufficiency   . Stenosis of cervical spine with myelopathy ([place]) 01/01/2018  . Ulcer of leg, chronic, right ([place]) 10/10/2011  . Ulcerative colitis   . ULCERATIVE COLITIS-LEFT SIDE 03/19/2008       . Vitamin B12 deficiency anemia 11/13/2006       . Vitamin D deficiency 02/06/2013   Past Surgical History:  Procedure Laterality Date  . BRONCHOSCOPY    . COLONOSCOPY WITH PROPOFOL N/A 02/23/2015   Procedure: COLONOSCOPY WITH PROPOFOL;  Surgeon: Ladene Artist, MD;  Location: [facility];  Service: Endoscopy;  Laterality: N/A;  . ESOPHAGOGASTRODUODENOSCOPY (EGD) WITH PROPOFOL N/A 02/23/2015   Procedure: ESOPHAGOGASTRODUODENOSCOPY (EGD) WITH PROPOFOL;  Surgeon: Ladene Artist, MD;  Location: [facility];  Service: Endoscopy;  Laterality: N/A;  . NO PAST SURGERIES      ROS- all systems are reviewed and negatives except as per HPI above  Current Outpatient Medications  Medication Sig Dispense Refill  . acetaminophen (TYLENOL) 325 MG tablet Take 650 mg by mouth every 6 (six) hours as needed for moderate pain, fever or headache.    . albuterol (PROAIR HFA) 108 (90 Base) MCG/ACT inhaler Inhale 1-2 puffs into the lungs every 6 (six) hours as needed for wheezing or shortness of breath. 18 g 11  . albuterol (PROVENTIL) (2.5 MG/3ML) 0.083% nebulizer solution Take 3 mLs (2.5 mg total) by nebulization every 6 (six) hours as needed for wheezing or shortness of breath. 150 mL 11  . allopurinol (ZYLOPRIM) 100 MG tablet  TAKE 1 TABLET BY MOUTH EVERY DAY FOR GOUT 90 tablet 0  . cyanocobalamin 2000 MCG tablet Take 1 tablet (2,000 mcg total) by mouth daily. (Patient taking differently: Take 2,000 mcg by mouth every evening. ) 90 tablet 3  . Dietary Management Product (VASCULERA) TABS TAKE ONE TABLET BY MOUTH EVERY DAY 90 tablet 1  . ferrous sulfate 325 (65 FE) MG tablet Take 1 tablet (325 mg total) by mouth 2 (two) times daily with a meal. 180 tablet 1  . fluticasone furoate-vilanterol (BREO ELLIPTA)  100-25 MCG/INH AEPB Inhale 1 puff into the lungs daily. 60 each 11  . isosorbide-hydrALAZINE (BIDIL) 20-37.5 MG tablet Take 1 tablet by mouth 3 (three) times daily. 270 tablet 1  . metoprolol tartrate (LOPRESSOR) 25 MG tablet Take 1 tablet (25 mg total) by mouth 2 (two) times daily. 180 tablet 3  . Pitavastatin Calcium (LIVALO) 1 MG TABS Take 1 tablet (1 mg total) by mouth daily. 90 tablet 1  . torsemide (DEMADEX) 20 MG tablet Take 40 mg by mouth daily.    . Vitamin D, Ergocalciferol, (DRISDOL) 50000 units CAPS capsule TAKE 50,000 CAPSULE BY MOUTH ONCE WEEKLY 12 capsule 1   No current facility-administered medications for this visit.     Physical Exam: Vitals:   03/03/18 1636  BP: 132/60  Pulse: 64  SpO2: 98%  Weight: 201 lb 6.4 oz (91.4 kg)  Height: 5' (1.524 m)     GEN- The patient is well appearing, alert and oriented x 3 today.   Head- normocephalic, atraumatic Eyes-  Sclera clear, conjunctiva pink Ears- hearing intact Oropharynx- clear Lungs- Clear to ausculation bilaterally, normal work of breathing Heart- Regular rate and rhythm, no murmurs, rubs or gallops, PMI not laterally displaced GI- soft, NT, ND, + BS Extremities- no clubbing, cyanosis, or edema  Wt Readings from Last 3 Encounters:  03/03/18 201 lb 6.4 oz (91.4 kg)  02/04/18 198 lb (89.8 kg)  02/03/18 197 lb (89.4 kg)    EKG tracing ordered today is personally reviewed and shows sinus 64 bpm, PR 198 msec, QRS 100 msec, Qtc 490mec, LVH  Assessment and Plan:  1. Sinus bradycardia Heart rate appears improved She does have occasional heart rates at home in the 40s/50s.  I have therefore reduced metoprolol to 12.521mBID today She is very clear that she does not want a ppm.  I think that we should be able to avoid this as she is doing well currently.  2. Paroxysmal atrial fibrillation Not currently on anticoagulation due to GI bleeds (per Dr VaIrish LackMedical options are limited by bradycardia  3.  HTN Stable No change required today  4. OSA Compliance with CPAP encouraged  Follow-up with EP PA in 3 months and then with Dr VaIrish Lack I will see as needed.  JaThompson GrayerD, FAGulf Coast Outpatient Surgery Center [facility] Dba [facility]/11/2017 4:39 PM

## 2018-03-24 DIAGNOSIS — J449 Chronic obstructive pulmonary disease, unspecified: Secondary | ICD-10-CM | POA: Diagnosis not present

## 2018-04-11 ENCOUNTER — Other Ambulatory Visit: Payer: Self-pay | Admitting: Internal Medicine

## 2018-04-11 DIAGNOSIS — I5042 Chronic combined systolic (congestive) and diastolic (congestive) heart failure: Secondary | ICD-10-CM

## 2018-04-11 DIAGNOSIS — I10 Essential (primary) hypertension: Secondary | ICD-10-CM

## 2018-04-24 DIAGNOSIS — J449 Chronic obstructive pulmonary disease, unspecified: Secondary | ICD-10-CM | POA: Diagnosis not present

## 2018-05-05 ENCOUNTER — Other Ambulatory Visit: Payer: Self-pay | Admitting: Internal Medicine

## 2018-05-05 DIAGNOSIS — R918 Other nonspecific abnormal finding of lung field: Secondary | ICD-10-CM

## 2018-05-08 ENCOUNTER — Telehealth: Payer: Self-pay | Admitting: Internal Medicine

## 2018-05-08 NOTE — Telephone Encounter (Signed)
Spoke to pt and I have samples and pt assistance forms for her to fill out.

## 2018-05-08 NOTE — Telephone Encounter (Signed)
Can you please give pt a call. She wants to talk about her medications

## 2018-05-08 NOTE — Telephone Encounter (Signed)
Bidil, Breo, and Livalo are too expensive.   I have some information  Resources for pt and I will look to see if I have samples.

## 2018-05-13 ENCOUNTER — Telehealth: Payer: Self-pay | Admitting: Internal Medicine

## 2018-05-13 DIAGNOSIS — J411 Mucopurulent chronic bronchitis: Secondary | ICD-10-CM

## 2018-05-13 MED ORDER — FLUTICASONE FUROATE-VILANTEROL 100-25 MCG/INH IN AEPB: 1.0000 | INHALATION_SPRAY | Freq: Every day | RESPIRATORY_TRACT | 11 refills | Status: DC

## 2018-05-13 NOTE — Telephone Encounter (Signed)
rx and allergy list printed and placed up front.   Pt contacted and informed of same.

## 2018-05-13 NOTE — Telephone Encounter (Signed)
Copied from [place] [id number]. Topic: General - Other >> May 13, 2018 12:00 PM Lennox Solders wrote: Reason for CRM: pt would like written rx breo and a list of medications she is allergic. Pt will pick up rx and list

## 2018-05-20 ENCOUNTER — Ambulatory Visit (INDEPENDENT_AMBULATORY_CARE_PROVIDER_SITE_OTHER)
Admission: RE | Admit: 2018-05-20 | Discharge: 2018-05-20 | Disposition: A | Discharge status: Home / Self Care | Payer: Medicare Other | Source: Ambulatory Visit | Attending: Internal Medicine | Admitting: Internal Medicine

## 2018-05-20 DIAGNOSIS — R918 Other nonspecific abnormal finding of lung field: Secondary | ICD-10-CM | POA: Diagnosis not present

## 2018-05-24 DIAGNOSIS — J449 Chronic obstructive pulmonary disease, unspecified: Secondary | ICD-10-CM | POA: Diagnosis not present

## 2018-06-01 NOTE — Progress Notes (Signed)
Cardiology Office Note Date:  06/04/2018  Patient ID:  Mendi, Constable [birth year], MRN [id number] PCP:  [name], MD  Cardiologist:  Dr. Irish Lack Electrophysiologist: Dr. Rayann Heman    Chief Complaint: planned 3 mo visit  History of Present Illness: RAJVI ARMENTOR is a 77 y.o. female with history of chronic CHF (diastolic), COLD (chronic obstructive pulmonary disease) with home O2, hx of cor-pulmonale, OSA, intolerant of CPAP, GERD, HTN, HLD, chronic venous stasis, ulcerative colitis, GI AVMs,  CKD (III), morbid obesity, eczema, AFib.  He comes in today to be seen for Dr. Rayann Heman.  Last seen by him in Aug 2019, no reported symptoms, metoprolol was reduced 2/2 bradycardia, noting that the patient was very clear that she did not want a ppm, and felt like likely could be avoided.  Mentioned not on a/c 2/2 hx of GI bleeding.  In review of Dr. Hassell Done last visit, perhaps a component of tachy-brday by history.  She feels like she is doing well. Self resumed her lopressor at 44m BID with worries that her HR would get to fast, this making her feel uncomfortable and states that she had only noted on a couple of occassions HR 40's previously.  She weighs every morning, checks her BP, O2 sat, and HR as well and then her HR periodically through the day.  She denies any symptoms of brady since the reduction of the lopressor from 550mBID > 2531mID.  No CP, no dizziness, near syncope or syncope.  She has baseline SOB, does not tend to use O2 when out and about, but wears it routinely at home.  This her routine for years.  No SOB/DOE outside of her baseline.   Past Medical History:  Diagnosis Date  . Angiodysplasia of cecum   . Angiodysplasia of duodenum   . Asteatotic eczema 02/25/2016  . Atrial fibrillation (HCCLarose/21/2019  . Chest pain at rest 03/22/2010   Qualifier: Diagnosis of  By: JonRonnald Ramp, ThoArvid Right . CHF (congestive heart failure) (HCCMuskog[place]/1/05  . CHF (congestive heart failure),  NYHA class III, chronic, combined (HCCFrizzleburg/06/2018  . Chronic venous stasis dermatitis of both lower extremities 06/02/2013  . CKD (chronic kidney disease), stage III (HCCGrandfather1/22/2011   Estimated Creatinine Clearance: 34.8 mL/min (A) (by C-G formula based on SCr of 1.37 mg/dL (H)).   . CMarland KitchenLD (chronic obstructive lung disease) (HCCGordon . Colonic polyp 02/16/2008   Tubular adenoma  . COPD (chronic obstructive pulmonary disease) (HCCEdgerton/1/99  . Cor pulmonale (HCCRiverdale Park/19/2016  . D-dimer, elevated 01/01/2018  . GERD 07/30/1992  . GERD (gastroesophageal reflux disease) 07/30/92  . Gout   . HTN (hypertension) 07/30/1988  . Hyperlipidemia 11/27/96  . Hyperlipidemia with target LDL less than 100 11/27/1996       . Kidney stone 1960, 1972, 1991  . MGUS (monoclonal gammopathy of unknown significance) 11/08/2016  . Morbid obesity (HCCNorth Topsail Beach/21/2011   She agrees to work on her lifestyle modifications to help her lose weight.   . Neck pain on right side 01/01/2018  . OSA (obstructive sleep apnea) 06/22/2013  . Osteopenia, senile 02/05/2013   July 2014  -2.1 left femur -1.9 left forearm   . Other screening mammogram 02/05/2013  . Prediabetes 11/13/2006  . Renal insufficiency   . Stenosis of cervical spine with myelopathy (HCCWayne City/11/2017  . Ulcer of leg, chronic, right (HCCWor[place]/13/2013  . Ulcerative colitis   . ULCERATIVE COLITIS-LEFT SIDE 03/19/2008       .  Vitamin B12 deficiency anemia 11/13/2006       . Vitamin D deficiency 02/06/2013    Past Surgical History:  Procedure Laterality Date  . BRONCHOSCOPY    . COLONOSCOPY WITH PROPOFOL N/A 02/23/2015   Procedure: COLONOSCOPY WITH PROPOFOL;  Surgeon: Ladene Artist, MD;  [facility];  Service: Endoscopy;  Laterality: N/A;  . ESOPHAGOGASTRODUODENOSCOPY (EGD) WITH PROPOFOL N/A 02/23/2015   Procedure: ESOPHAGOGASTRODUODENOSCOPY (EGD) WITH PROPOFOL;  Surgeon: Ladene Artist, MD;  [facility];  Service: Endoscopy;  Laterality: N/A;  . NO PAST SURGERIES       Current Outpatient Medications  Medication Sig Dispense Refill  . acetaminophen (TYLENOL) 325 MG tablet Take 650 mg by mouth every 6 (six) hours as needed for moderate pain, fever or headache.    . albuterol (PROAIR HFA) 108 (90 Base) MCG/ACT inhaler Inhale 1-2 puffs into the lungs every 6 (six) hours as needed for wheezing or shortness of breath. 18 g 11  . albuterol (PROVENTIL) (2.5 MG/3ML) 0.083% nebulizer solution Take 3 mLs (2.5 mg total) by nebulization every 6 (six) hours as needed for wheezing or shortness of breath. 150 mL 11  . allopurinol (ZYLOPRIM) 100 MG tablet TAKE 1 TABLET BY MOUTH EVERY DAY FOR GOUT 90 tablet 0  . BIDIL 20-37.5 MG tablet TAKE 1 TABLET BY MOUTH THREE TIMES DAILY 270 tablet 0  . cyanocobalamin 2000 MCG tablet Take 1 tablet (2,000 mcg total) by mouth daily. (Patient taking differently: Take 2,000 mcg by mouth every evening. ) 90 tablet 3  . Dietary Management Product (VASCULERA) TABS TAKE ONE TABLET BY MOUTH EVERY DAY 90 tablet 1  . ferrous sulfate 325 (65 FE) MG tablet Take 1 tablet (325 mg total) by mouth 2 (two) times daily with a meal. 180 tablet 1  . fluticasone furoate-vilanterol (BREO ELLIPTA) 100-25 MCG/INH AEPB Inhale 1 puff into the lungs daily. 60 each 11  . Pitavastatin Calcium (LIVALO) 1 MG TABS Take 1 tablet (1 mg total) by mouth daily. 90 tablet 1  . torsemide (DEMADEX) 20 MG tablet TAKE 1 TABLET(20 MG) BY MOUTH TWICE DAILY 180 tablet 0  . Vitamin D, Ergocalciferol, (DRISDOL) 50000 units CAPS capsule TAKE 50,000 CAPSULE BY MOUTH ONCE WEEKLY 12 capsule 1  . metoprolol tartrate (LOPRESSOR) 25 MG tablet Take 0.5 tablets (12.5 mg total) by mouth 2 (two) times daily. 90 tablet 3   No current facility-administered medications for this visit.     Allergies:   Celebrex [celecoxib]; Enalapril; Lipitor [atorvastatin]; Amlodipine besylate; Amoxicillin; Codeine sulfate; and Hydrocodone-acetaminophen   Social History:  The patient  reports that she quit  smoking about 22 years ago. She has never used smokeless tobacco. She reports that she does not drink alcohol or use drugs.   Family History:  The patient's family history includes Cervical cancer in her sister; Clotting disorder in her sister; Crohn's disease in her brother; Diabetes in her mother and sister; Heart attack in her sister; Heart disease in her brother, brother, mother, and sister; Kidney cancer in her mother; Lung cancer in her sister; Stomach cancer in her mother; Stroke in her father and mother.  ROS:  Please see the history of present illness.  All other systems are reviewed and otherwise negative.   PHYSICAL EXAM:  VS:  BP 138/68   Pulse (!) 58   Ht 5' (1.524 m)   Wt 207 lb (93.9 kg)   SpO2 97%   BMI 40.43 kg/m  BMI: Body mass index is 40.43 kg/m.  Well nourished, well developed, in no acute distress  HEENT: normocephalic, atraumatic  Neck: no JVD, carotid bruits or masses Cardiac:  RRR; 2/6 SM, no rubs, or gallops Lungs:  CTA b/l, no wheezing, rhonchi or rales  Abd: soft, nontender MS: no deformity, age appropriate atrophy Ext: 1++ edema, chronic looking skin changes  Skin: warm and dry, no rash Neuro:  No gross deficits appreciated Psych: euthymic mood, full affect   EKG:  Not done today    10/18/17: TTE Study Conclusions - Left ventricle: The cavity size was normal. There was moderate   concentric hypertrophy. Systolic function was vigorous. The   estimated ejection fraction was in the range of 65% to 70%. Wall   motion was normal; there were no regional wall motion   abnormalities. The study is not technically sufficient to allow   evaluation of LV diastolic function. - Aortic valve: Trileaflet; mildly thickened, mildly calcified   leaflets. - Mitral valve: There was mild regurgitation.    Recent Labs: 08/08/2017: ALT 11 10/17/2017: B Natriuretic Peptide 324.4; TSH 3.276 10/20/2017: Magnesium 2.6 01/01/2018: Pro B Natriuretic peptide (BNP)  145.0 02/04/2018: BUN 33; Creatinine, Ser 1.36; Hemoglobin 10.1; Platelets 195.0; Potassium 4.3; Sodium 139  02/04/2018: Cholesterol 151; HDL 56.30; LDL Cholesterol 78; Total CHOL/HDL Ratio 3; Triglycerides 85.0; VLDL 17.0   CrCl cannot be calculated (Patient's most recent lab result is older than the maximum 21 days allowed.).   Wt Readings from Last 3 Encounters:  06/04/18 207 lb (93.9 kg)  03/03/18 201 lb 6.4 oz (91.4 kg)  02/04/18 198 lb (89.8 kg)     Other studies reviewed: Additional studies/records reviewed today include: summarized above  ASSESSMENT AND PLAN:  1. Bradycardia     Asymptomatic, patient had wanted to avoid PPM      Re-visited concerns of bradycardia and she is agreeable to reduce her metoprolol to 12.45m BID, she will keep track of her BP and HR as usual and let me know should she have any fast rates.  2. HTN     No changes today     She mentions the Bidil is very expensive, she is going to check at her pharmacy if 2 separate Rx is cheaper then the combined pill, if so, will write for them separately for her.  3. Paroxysmal AFib     CHA2DS2Vasc is 4     not on a/c 2/2 GI bleeds  4. Edema     She weighs every day, weight at home waxes/wanes a pound or 2 but no real net-gain that she has appreciated     Her edema tends to be worse when not able to elevate her legs as much as usual     She is very knowledgeable on daily weights, monitoring her edema, and use of her diuretic     Follows with her PMD  5. HLD     July labs look OK, monitored and managed by her PMD     She reports just started on the Livalo, also quite expensive for her     She will inquire with her PMD an alternative since he manages this        Disposition: As above, f/u with uKoreain 3-458month sooner if needed.  Current medicines are reviewed at length with the patient today.  The patient did not have any concerns regarding medicines.  SiVenetia NightPA-C 06/04/2018 10:50 AM      [facility] [address] [place]  [address] [phone] (office)  [phone] (fax)

## 2018-06-04 ENCOUNTER — Ambulatory Visit: Payer: Medicare Other | Admitting: Physician Assistant

## 2018-06-04 VITALS — BP 138/68 | HR 58 | Ht 60.0 in | Wt 207.0 lb

## 2018-06-04 DIAGNOSIS — I10 Essential (primary) hypertension: Secondary | ICD-10-CM

## 2018-06-04 DIAGNOSIS — E7849 Other hyperlipidemia: Secondary | ICD-10-CM | POA: Diagnosis not present

## 2018-06-04 DIAGNOSIS — R609 Edema, unspecified: Secondary | ICD-10-CM

## 2018-06-04 DIAGNOSIS — I480 Paroxysmal atrial fibrillation: Secondary | ICD-10-CM | POA: Diagnosis not present

## 2018-06-04 DIAGNOSIS — R001 Bradycardia, unspecified: Secondary | ICD-10-CM | POA: Diagnosis not present

## 2018-06-04 NOTE — Patient Instructions (Addendum)
Medication Instructions:   START TAKING METOPROLOL 12.5 TWICE A DAY    If you need a refill on your cardiac medications before your next appointment, please call your pharmacy.  Labwork: NONE ORDERED  TODAY    Testing/Procedures: NONE ORDERED  TODAY   Follow-Up:  3 TO 4 MONTHS WITH URSUY  CONTACT CLINIC BACK  NEXT MONTH TO SEE IF APPOINTMENTS AVAILABLE.   Any Other Special Instructions Will Be Listed Below (If Applicable).

## 2018-06-05 ENCOUNTER — Other Ambulatory Visit: Payer: Self-pay | Admitting: Internal Medicine

## 2018-06-18 ENCOUNTER — Other Ambulatory Visit: Payer: Self-pay | Admitting: Internal Medicine

## 2018-06-18 DIAGNOSIS — I872 Venous insufficiency (chronic) (peripheral): Secondary | ICD-10-CM

## 2018-06-24 DIAGNOSIS — J449 Chronic obstructive pulmonary disease, unspecified: Secondary | ICD-10-CM | POA: Diagnosis not present

## 2018-06-25 ENCOUNTER — Telehealth: Payer: Self-pay | Admitting: Physician Assistant

## 2018-06-25 NOTE — Telephone Encounter (Signed)
  Tommye Standard asked patient to call and let her know how she was doing with her medication change. Patient states the medication is doing okay but she has not seen a drastic change. Her heart rate has been getting up in the 80's when you up doing things.   Patient checked into the generic for BIDIL 20-37.5 MG tablet and the pharmacist gave her the names, Isosnbide dinitrabu 20 mg and hydralazzinc 37.5 mg. Patient would like to get scripts for these generics.

## 2018-07-16 ENCOUNTER — Telehealth: Payer: Self-pay | Admitting: *Deleted

## 2018-07-16 NOTE — Telephone Encounter (Signed)
LMOVM OF PT CALLING TO SEE IF MEDICATIONS ORDERS WAS HANDLED OR PCP RECOMMENDATION DUE TO HE IS PRIMARY WHO  ORGINALLY ORDERED MEDICINE.AND CLINIC NUMBER TO CALL BACK IF NEEDED HELP STILL.

## 2018-07-24 DIAGNOSIS — J449 Chronic obstructive pulmonary disease, unspecified: Secondary | ICD-10-CM | POA: Diagnosis not present

## 2018-07-31 ENCOUNTER — Other Ambulatory Visit: Payer: Self-pay | Admitting: Internal Medicine

## 2018-07-31 DIAGNOSIS — I10 Essential (primary) hypertension: Secondary | ICD-10-CM

## 2018-08-04 ENCOUNTER — Encounter (HOSPITAL_COMMUNITY): Payer: Self-pay | Admitting: Emergency Medicine

## 2018-08-04 ENCOUNTER — Ambulatory Visit: Payer: Self-pay | Admitting: Internal Medicine

## 2018-08-04 ENCOUNTER — Observation Stay (HOSPITAL_COMMUNITY)
Admission: EM | Admit: 2018-08-04 | Discharge: 2018-08-06 | Disposition: A | Discharge status: Home / Self Care | Payer: Medicare Other | Attending: Internal Medicine | Admitting: Internal Medicine

## 2018-08-04 ENCOUNTER — Other Ambulatory Visit: Payer: Self-pay

## 2018-08-04 DIAGNOSIS — G4733 Obstructive sleep apnea (adult) (pediatric): Secondary | ICD-10-CM | POA: Diagnosis not present

## 2018-08-04 DIAGNOSIS — I10 Essential (primary) hypertension: Secondary | ICD-10-CM

## 2018-08-04 DIAGNOSIS — R1032 Left lower quadrant pain: Secondary | ICD-10-CM | POA: Diagnosis not present

## 2018-08-04 DIAGNOSIS — N183 Chronic kidney disease, stage 3 (moderate): Secondary | ICD-10-CM

## 2018-08-04 DIAGNOSIS — K625 Hemorrhage of anus and rectum: Secondary | ICD-10-CM | POA: Diagnosis not present

## 2018-08-04 DIAGNOSIS — I482 Chronic atrial fibrillation, unspecified: Secondary | ICD-10-CM | POA: Diagnosis not present

## 2018-08-04 DIAGNOSIS — K5190 Ulcerative colitis, unspecified, without complications: Secondary | ICD-10-CM | POA: Diagnosis not present

## 2018-08-04 DIAGNOSIS — I5033 Acute on chronic diastolic (congestive) heart failure: Secondary | ICD-10-CM | POA: Diagnosis present

## 2018-08-04 DIAGNOSIS — K5520 Angiodysplasia of colon without hemorrhage: Secondary | ICD-10-CM

## 2018-08-04 DIAGNOSIS — K921 Melena: Secondary | ICD-10-CM | POA: Diagnosis not present

## 2018-08-04 DIAGNOSIS — K5150 Left sided colitis without complications: Secondary | ICD-10-CM

## 2018-08-04 DIAGNOSIS — I5032 Chronic diastolic (congestive) heart failure: Secondary | ICD-10-CM | POA: Diagnosis not present

## 2018-08-04 DIAGNOSIS — I4891 Unspecified atrial fibrillation: Secondary | ICD-10-CM | POA: Diagnosis present

## 2018-08-04 DIAGNOSIS — E669 Obesity, unspecified: Secondary | ICD-10-CM | POA: Diagnosis not present

## 2018-08-04 DIAGNOSIS — N1832 Chronic kidney disease, stage 3b: Secondary | ICD-10-CM | POA: Diagnosis present

## 2018-08-04 DIAGNOSIS — J449 Chronic obstructive pulmonary disease, unspecified: Secondary | ICD-10-CM | POA: Diagnosis not present

## 2018-08-04 DIAGNOSIS — I5042 Chronic combined systolic (congestive) and diastolic (congestive) heart failure: Secondary | ICD-10-CM | POA: Diagnosis not present

## 2018-08-04 DIAGNOSIS — D500 Iron deficiency anemia secondary to blood loss (chronic): Secondary | ICD-10-CM | POA: Diagnosis not present

## 2018-08-04 DIAGNOSIS — I130 Hypertensive heart and chronic kidney disease with heart failure and stage 1 through stage 4 chronic kidney disease, or unspecified chronic kidney disease: Secondary | ICD-10-CM | POA: Diagnosis not present

## 2018-08-04 DIAGNOSIS — I480 Paroxysmal atrial fibrillation: Secondary | ICD-10-CM | POA: Diagnosis not present

## 2018-08-04 DIAGNOSIS — N179 Acute kidney failure, unspecified: Secondary | ICD-10-CM | POA: Diagnosis present

## 2018-08-04 DIAGNOSIS — D509 Iron deficiency anemia, unspecified: Secondary | ICD-10-CM | POA: Diagnosis present

## 2018-08-04 DIAGNOSIS — J411 Mucopurulent chronic bronchitis: Secondary | ICD-10-CM

## 2018-08-04 LAB — COMPREHENSIVE METABOLIC PANEL
ALT: 10 U/L (ref 0–44)
ANION GAP: 8 (ref 5–15)
AST: 16 U/L (ref 15–41)
Albumin: 3.4 g/dL — ABNORMAL LOW (ref 3.5–5.0)
Alkaline Phosphatase: 52 U/L (ref 38–126)
BUN: 49 mg/dL — ABNORMAL HIGH (ref 8–23)
CHLORIDE: 102 mmol/L (ref 98–111)
CO2: 28 mmol/L (ref 22–32)
Calcium: 8.8 mg/dL — ABNORMAL LOW (ref 8.9–10.3)
Creatinine, Ser: 1.6 mg/dL — ABNORMAL HIGH (ref 0.44–1.00)
GFR calc Af Amer: 35 mL/min — ABNORMAL LOW (ref >60)
GFR calc non Af Amer: 31 mL/min — ABNORMAL LOW (ref >60)
Glucose, Bld: 133 mg/dL — ABNORMAL HIGH (ref 70–99)
Potassium: 4.8 mmol/L (ref 3.5–5.1)
Sodium: 138 mmol/L (ref 135–145)
Total Bilirubin: 0.4 mg/dL (ref 0.3–1.2)
Total Protein: 6.4 g/dL — ABNORMAL LOW (ref 6.5–8.1)

## 2018-08-04 LAB — PREPARE RBC (CROSSMATCH)

## 2018-08-04 LAB — CBC
HEMATOCRIT: 25.1 % — AB (ref 36.0–46.0)
Hemoglobin: 7.7 g/dL — ABNORMAL LOW (ref 12.0–15.0)
MCH: 26.7 pg (ref 26.0–34.0)
MCHC: 30.7 g/dL (ref 30.0–36.0)
MCV: 87.2 fL (ref 80.0–100.0)
Platelets: 198 10*3/uL (ref 150–400)
RBC: 2.88 MIL/uL — ABNORMAL LOW (ref 3.87–5.11)
RDW: 13.9 % (ref 11.5–15.5)
WBC: 11.1 10*3/uL — ABNORMAL HIGH (ref 4.0–10.5)
nRBC: 0 % (ref 0.0–0.2)

## 2018-08-04 LAB — HEMOGLOBIN
Hemoglobin: 6.9 g/dL — CL (ref 12.0–15.0)
Hemoglobin: 6.9 g/dL — CL (ref 12.0–15.0)

## 2018-08-04 LAB — POC OCCULT BLOOD, ED: Fecal Occult Bld: POSITIVE — AB

## 2018-08-04 MED ORDER — SODIUM CHLORIDE 0.9 % IV BOLUS
1000.0000 mL | Freq: Once | INTRAVENOUS | Status: AC
  Administered 2018-08-04: 1000 mL via INTRAVENOUS

## 2018-08-04 MED ORDER — ONDANSETRON HCL 4 MG/2ML IJ SOLN: 4.0000 mg | Freq: Four times a day (QID) | INTRAMUSCULAR | Status: DC | PRN

## 2018-08-04 MED ORDER — TORSEMIDE 20 MG PO TABS
40.0000 mg | ORAL_TABLET | Freq: Every day | ORAL | Status: DC
  Administered 2018-08-05 – 2018-08-06 (×2): 40 mg via ORAL
  Filled 2018-08-04 (×2): qty 2

## 2018-08-04 MED ORDER — ALLOPURINOL 100 MG PO TABS
100.0000 mg | ORAL_TABLET | Freq: Every day | ORAL | Status: DC
  Administered 2018-08-05 – 2018-08-06 (×2): 100 mg via ORAL
  Filled 2018-08-04 (×2): qty 1

## 2018-08-04 MED ORDER — FERROUS SULFATE 325 (65 FE) MG PO TABS
325.0000 mg | ORAL_TABLET | Freq: Two times a day (BID) | ORAL | Status: DC
  Administered 2018-08-04 – 2018-08-06 (×4): 325 mg via ORAL
  Filled 2018-08-04 (×4): qty 1

## 2018-08-04 MED ORDER — ALBUTEROL SULFATE (2.5 MG/3ML) 0.083% IN NEBU: 2.5000 mg | INHALATION_SOLUTION | RESPIRATORY_TRACT | Status: DC | PRN

## 2018-08-04 MED ORDER — ORAL CARE MOUTH RINSE
15.0000 mL | Freq: Two times a day (BID) | OROMUCOSAL | Status: DC
  Administered 2018-08-05: 15 mL via OROMUCOSAL

## 2018-08-04 MED ORDER — ACETAMINOPHEN 325 MG PO TABS: 650.0000 mg | ORAL_TABLET | Freq: Four times a day (QID) | ORAL | Status: DC | PRN

## 2018-08-04 MED ORDER — PANTOPRAZOLE SODIUM 40 MG IV SOLR
40.0000 mg | Freq: Once | INTRAVENOUS | Status: AC
  Administered 2018-08-04: 40 mg via INTRAVENOUS
  Filled 2018-08-04: qty 40

## 2018-08-04 MED ORDER — FLUTICASONE FUROATE-VILANTEROL 100-25 MCG/INH IN AEPB
1.0000 | INHALATION_SPRAY | Freq: Every day | RESPIRATORY_TRACT | Status: DC
  Administered 2018-08-05 – 2018-08-06 (×2): 1 via RESPIRATORY_TRACT
  Filled 2018-08-04: qty 28

## 2018-08-04 MED ORDER — ONDANSETRON HCL 4 MG/2ML IJ SOLN
4.0000 mg | Freq: Once | INTRAMUSCULAR | Status: AC
  Administered 2018-08-04: 4 mg via INTRAVENOUS
  Filled 2018-08-04: qty 2

## 2018-08-04 MED ORDER — CHLORHEXIDINE GLUCONATE 0.12 % MT SOLN
15.0000 mL | Freq: Two times a day (BID) | OROMUCOSAL | Status: DC
  Administered 2018-08-04 – 2018-08-06 (×3): 15 mL via OROMUCOSAL
  Filled 2018-08-04 (×3): qty 15

## 2018-08-04 MED ORDER — OXYCODONE HCL 5 MG PO TABS: 5.0000 mg | ORAL_TABLET | ORAL | Status: DC | PRN

## 2018-08-04 MED ORDER — ONDANSETRON HCL 4 MG PO TABS: 4.0000 mg | ORAL_TABLET | Freq: Four times a day (QID) | ORAL | Status: DC | PRN

## 2018-08-04 MED ORDER — METOPROLOL TARTRATE 25 MG PO TABS
12.5000 mg | ORAL_TABLET | Freq: Two times a day (BID) | ORAL | Status: DC
  Administered 2018-08-05 – 2018-08-06 (×2): 12.5 mg via ORAL
  Filled 2018-08-04 (×4): qty 1

## 2018-08-04 MED ORDER — ISOSORB DINITRATE-HYDRALAZINE 20-37.5 MG PO TABS
1.0000 | ORAL_TABLET | Freq: Three times a day (TID) | ORAL | Status: DC
  Administered 2018-08-05 – 2018-08-06 (×3): 1 via ORAL
  Filled 2018-08-04 (×8): qty 1

## 2018-08-04 MED ORDER — SODIUM CHLORIDE 0.9% IV SOLUTION: Freq: Once | INTRAVENOUS | Status: DC

## 2018-08-04 MED ORDER — MORPHINE SULFATE (PF) 2 MG/ML IV SOLN
2.0000 mg | Freq: Once | INTRAVENOUS | Status: AC
  Administered 2018-08-04: 2 mg via INTRAVENOUS
  Filled 2018-08-04: qty 1

## 2018-08-04 MED ORDER — ACETAMINOPHEN 650 MG RE SUPP: 650.0000 mg | Freq: Four times a day (QID) | RECTAL | Status: DC | PRN

## 2018-08-04 NOTE — H&P (Signed)
History and Physical  Patient Name: [name]     DDU:[id number]    DOB: [birth year]    DOA: 08/04/2018 PCP: [name], MD  Patient coming from: Home  Chief Complaint: Hematochezia      HPI: [name] is a 78 y.o. F with hx dCHF and COPD on home O2, OSA not on CPAP, MO, secondary pHTN, ulcerative colitis, CKD III baseline Cr 1.3, known GI AVMs and chronic iron deficiency anemia as well as Afib not on AC due to GIB who presents with hematochezia starting tonight.  The patient was in her usual state of health completely, until last night around 11 PM, she developed some cramping, bloating, and fecal urgency, and had a bright red bloody bowel movement.  Overnight then she had 7 more frankly bloody or brown bowel movements with clots in them until this morning she came to the emergency room.  ED course: - Afebrile, heart rate 60, respirations and pulse ox normal, blood pressure 123/57 -Na 138, K 4.8, Cr 1.6 (baseline 1.3), WBC 11.1 K, Hgb 7.7 (down from 10.1 g/dL in July) - The patient had gross blood on rectal examination - She was typed and screened, and the hospital service were asked to evaluate for hematochezia     ROS: Review of Systems  Constitutional: Negative for chills, fever and malaise/fatigue.  Respiratory: Negative for cough, hemoptysis, sputum production, shortness of breath and wheezing.   Gastrointestinal: Positive for abdominal pain, blood in stool and diarrhea. Negative for constipation, melena, nausea and vomiting.  All other systems reviewed and are negative.         Past Medical History:  Diagnosis Date  . Angiodysplasia of cecum   . Angiodysplasia of duodenum   . Asteatotic eczema 02/25/2016  . Atrial fibrillation ([place]) 10/17/2017  . Chest pain at rest 03/22/2010   Qualifier: Diagnosis of  By: Ronnald Ramp MD, Arvid Right.   . CHF (congestive heart failure) ([place]) 08/31/03  . CHF (congestive heart failure), NYHA class III, chronic, combined ([place])  08/10/2017  . Chronic venous stasis dermatitis of both lower extremities 06/02/2013  . CKD (chronic kidney disease), stage III ([place]) 06/20/2010   Estimated Creatinine Clearance: 34.8 mL/min (A) (by C-G formula based on SCr of 1.37 mg/dL (H)).   Marland Kitchen COLD (chronic obstructive lung disease) ([place])   . Colonic polyp 02/16/2008   Tubular adenoma  . COPD (chronic obstructive pulmonary disease) ([place]) 01/27/98  . Cor pulmonale ([place]) 11/16/2014  . D-dimer, elevated 01/01/2018  . GERD 07/30/1992  . GERD (gastroesophageal reflux disease) 07/30/92  . Gout   . HTN (hypertension) 07/30/1988  . Hyperlipidemia 11/27/96  . Hyperlipidemia with target LDL less than 100 11/27/1996       . Kidney stone 1960, 1972, 1991  . MGUS (monoclonal gammopathy of unknown significance) 11/08/2016  . Morbid obesity ([place]) 04/19/2010   She agrees to work on her lifestyle modifications to help her lose weight.   . Neck pain on right side 01/01/2018  . OSA (obstructive sleep apnea) 06/22/2013  . Osteopenia, senile 02/05/2013   July 2014  -2.1 left femur -1.9 left forearm   . Other screening mammogram 02/05/2013  . Prediabetes 11/13/2006  . Renal insufficiency   . Stenosis of cervical spine with myelopathy ([place]) 01/01/2018  . Ulcer of leg, chronic, right ([place]) 10/10/2011  . Ulcerative colitis   . ULCERATIVE COLITIS-LEFT SIDE 03/19/2008       . Vitamin B12 deficiency anemia 11/13/2006       .  Vitamin D deficiency 02/06/2013    Past Surgical History:  Procedure Laterality Date  . BRONCHOSCOPY    . COLONOSCOPY WITH PROPOFOL N/A 02/23/2015   Procedure: COLONOSCOPY WITH PROPOFOL;  Surgeon: Ladene Artist, MD;  [facility];  Service: Endoscopy;  Laterality: N/A;  . ESOPHAGOGASTRODUODENOSCOPY (EGD) WITH PROPOFOL N/A 02/23/2015   Procedure: ESOPHAGOGASTRODUODENOSCOPY (EGD) WITH PROPOFOL;  Surgeon: Ladene Artist, MD;  [facility];  Service: Endoscopy;  Laterality: N/A;  . NO PAST SURGERIES      Social History: Patient lives  with her husband and handicapped daughter.  The patient walks unassisted.  Nonsmoker.  Allergies  Allergen Reactions  . Amoxicillin Diarrhea    DID THE REACTION INVOLVE: Swelling of the face/tongue/throat, SOB, or low BP? N Sudden or severe rash/hives, skin peeling, or the inside of the mouth or nose? N Did it require medical treatment? N When did it last happen?MORE THAN 10 YEARS If all above answers are "NO", may proceed with cephalosporin use.   . Celebrex [Celecoxib]     edema  . Enalapril Cough  . Lipitor [Atorvastatin]     Muscle aches  . Amlodipine Besylate     REACTION: edema  . Codeine Sulfate     REACTION: Nausea  . Hydrocodone-Acetaminophen     REACTION: Nausea    Family history: family history includes Cervical cancer in her sister; Clotting disorder in her sister; Crohn's disease in her brother; Diabetes in her mother and sister; Heart attack in her sister; Heart disease in her brother, brother, mother, and sister; Kidney cancer in her mother; Lung cancer in her sister; Stomach cancer in her mother; Stroke in her father and mother.  Prior to Admission medications   Medication Sig Start Date End Date Taking? Authorizing Provider  acetaminophen (TYLENOL) 325 MG tablet Take 325-650 mg by mouth daily as needed for moderate pain, fever or headache.    Yes [provider]  albuterol (PROAIR HFA) 108 (90 Base) MCG/ACT inhaler Inhale 1-2 puffs into the lungs every 6 (six) hours as needed for wheezing or shortness of breath. 10/30/16  Yes [name], MD  allopurinol (ZYLOPRIM) 100 MG tablet TAKE 1 TABLET BY MOUTH EVERY DAY FOR GOUT Patient taking differently: Take 100 mg by mouth daily. TAKE 1 TABLET BY MOUTH EVERY DAY FOR GOUT 06/05/18  Yes [name], MD  BIDIL 20-37.5 MG tablet TAKE 1 TABLET BY MOUTH THREE TIMES DAILY 04/11/18  Yes [name], MD  cyanocobalamin 2000 MCG tablet Take 1 tablet (2,000 mcg total) by mouth daily. Patient taking  differently: Take 2,000 mcg by mouth every evening.  02/24/16  Yes [name], MD  Dietary Management Product (VASCULERA) TABS TAKE ONE TABLET BY MOUTH EVERY DAY 06/18/18  Yes [name], MD  ferrous sulfate 325 (65 FE) MG tablet Take 1 tablet (325 mg total) by mouth 2 (two) times daily with a meal. 02/05/18  Yes [name], MD  fluticasone furoate-vilanterol (BREO ELLIPTA) 100-25 MCG/INH AEPB Inhale 1 puff into the lungs daily. 05/13/18  Yes [name], MD  metoprolol tartrate (LOPRESSOR) 25 MG tablet Take 0.5 tablets (12.5 mg total) by mouth 2 (two) times daily. 03/03/18 08/04/18 Yes Allred, Jeneen Rinks, MD  torsemide (DEMADEX) 20 MG tablet TAKE 1 TABLET(20 MG) BY MOUTH TWICE DAILY Patient taking differently: Take 40 mg by mouth daily.  07/31/18  Yes [name], MD  Vitamin D, Ergocalciferol, (DRISDOL) 50000 units CAPS capsule TAKE 50,000 CAPSULE BY  MOUTH ONCE WEEKLY 02/20/18  Yes [name], MD       Physical Exam: BP (!) 157/47 (BP Location: Right Arm)   Pulse 64   Temp 97.8 F (36.6 C) (Oral)   Resp 20   Ht 5' (1.524 m)   Wt 94.1 kg   SpO2 100%   BMI 40.52 kg/m  General appearance: Well-developed, obese adult female, alert and in no acute distress.   Eyes: Anicteric, conjunctiva pink, lids and lashes normal. PERRL.    ENT: No nasal deformity, discharge, epistaxis.  Hearing normal. OP moist without lesions.   Neck: No neck masses.  Trachea midline.  No thyromegaly/tenderness. Lymph: No cervical or supraclavicular lymphadenopathy. Skin: Warm and dry.  No jaundice.  No suspicious rashes or lesions.  Venous stasis change in legs. Cardiac: RRR, nl S1-S2, no murmurs appreciated.  Capillary refill is brisk.  JVP not visible.  Brawny bilateral edema.  Radial pulses 2+ and symmetric. Respiratory: Normal respiratory rate and rhythm.  CTAB without rales or wheezes. Abdomen: Abdomen soft.  Nonfocal mild TTP without gaurding or rebound. No ascites, distension,  hepatosplenomegaly.   MSK: No deformities or effusions of the large joints of the upper or lower extremities bilaterally.  No cyanosis or clubbing. Neuro:   Speech is fluent.  Muscle strength normal and symmetric.    Psych: Sensorium intact and responding to questions, attention normal.  Behavior appropriate.  Affect normal.  Judgment and insight appear normal.     Labs on Admission:  I have personally reviewed following labs and imaging studies: CBC: Recent Labs  Lab 08/04/18 1154 08/04/18 1411  WBC 11.1*  --   HGB 7.7* 6.9*  HCT 25.1*  --   MCV 87.2  --   PLT 198  --    Basic Metabolic Panel: Recent Labs  Lab 08/04/18 1154  NA 138  K 4.8  CL 102  CO2 28  GLUCOSE 133*  BUN 49*  CREATININE 1.60*  CALCIUM 8.8*   GFR: Estimated Creatinine Clearance: 29.7 mL/min (A) (by C-G formula based on SCr of 1.6 mg/dL (H)).  Liver Function Tests: Recent Labs  Lab 08/04/18 1154  AST 16  ALT 10  ALKPHOS 52  BILITOT 0.4  PROT 6.4*  ALBUMIN 3.4*     BNP (last 3 results) Recent Labs    08/08/17 1622 01/01/18 0907  PROBNP 304.0* 145.0*              Assessment/Plan  Hematochezia from suspected lower GI AVM bleeding Has duodenal as well as cecal AVMs from colonoscopy in 2016.  On iron for chronic blood loss anemia.  Other diagnostic considerations include ulcerative colitis (although GI notes suggest this is quiescent she has never been on maintenance medicines), ischemic colitis, infectious colitis.  Last colonoscopy showed no diverticuli.  - Consult gastroenterology, appreciate expert cares - Check H/H every 6 hours - Clears only - Possible endoscopy tomorrow per GI    Acute on chronic blood loss anemia Baseline hemoglobin around 10.  On iron.  Currently normocytic.  Hgb <7 g/dL on re-check  -Transfuse 1 unit now -Trend hemoglobin every 6 hrs     COPD Chronic respiratory failure on home oxygen No active disease - Continue home Breo -Albuterol as  needed  Chronic diastolic CHF Appears euvolemic. -Continue home torsemide -Continue home metoprolol and BiDil  Gout No active disease -Continue home allopurinol  Sleep apnea Does not tolerate CPAP  Chronic kidney disease stage III Creatinine slightly elevated relative to baseline. -  Close monitoring renal function      DVT prophylaxis: SCDs Code Status: DO NOT RESUSCITATE Family Communication: Husband at the bedside Disposition Plan: Anticipate transfusions as needed, close monitoring hemoglobin, clears, endoscopy per GI. Consults called: [facility] Admission status: Observation   At the point of initial evaluation, it is my clinical opinion that admission for OBSERVATION is reasonable and necessary because the patient's presenting complaints in the context of their chronic conditions represent sufficient risk of deterioration or significant morbidity to constitute reasonable grounds for close observation in the hospital setting, but that the patient may be medically stable for discharge from the hospital within 24 to 48 hours.    Medical decision making: Patient seen at 2:00 PM on 08/04/2018.  The patient was discussed with Dr. Tyrone Nine.  What exists of the patient's chart was reviewed in depth and summarized above.  Clinical condition: stable heart rate and O2 on home o2.  No further BM in hospital yet. Mentation good.        [place] [facility] Pager: please page via [email or web address], enter password "[email or web address]", and identify attending under [facility]

## 2018-08-04 NOTE — Progress Notes (Signed)
Notified on call physician about the patient's BP 113/40 HR 59. Asked if the medication should be held or given.

## 2018-08-04 NOTE — ED Notes (Signed)
Date and time results received: 08/04/18 2:27 PM    Test: hemoglobin Critical Value: 6.9  Name of Provider Notified: Tyrone Nine MD  Orders Received? Or Actions Taken?: acknowledges order

## 2018-08-04 NOTE — ED Triage Notes (Signed)
Pt c/o rectal bleeding that started this morning. Reports that had dark stools for first 4 then turned to brighter red. Denies being on blood thinners. Having abd pains.

## 2018-08-04 NOTE — H&P (View-Only) (Signed)
Consultation  Referring Provider: Dr. Tyrone Nine      Primary Care Physician:  [name], MD Primary Gastroenterologist:  Dr. Fuller Plan       Reason for Consultation:  Hematochezia, Anemia            HPI:   [name] is a 78 y.o. female with a past medical history as listed below including A. fib (echo 10/18/2017 LVEF 65-70%), CHF, CKD stage III, chronic obstructive lung disease on O2, ulcerative colitis diagnosed back in 2009 (never on maintenance med), with repeat colonoscopy in 2016 with no signs of colitis and others listed below, who presented to the ED today with a complaint of rectal bleeding.    Today, patient describes that about 12 hours ago she started with dark "bloody" bowel movements around 11 PM last night, since then she has had 7 bowel movements all bloody, though it is getting somewhat lighter in appearance.  Prior to this did have some lower abdominal pain which started around 10 PM and she felt very bloated, continues with only minimal lower abdominal pain now, rated as a 3-4/10.  Associated symptoms include weakness.    Denies fever, chills, weight loss, anorexia, nausea, vomiting, heartburn or reflux.  ER course: Hemoccult positive, CMP with a BUN elevated at 49, creatinine 1.6 (baseline around 1.4), CBC with a white count elevated 11.1, hemoglobin low at 7.7 (10.1 6 months ago)  GI history: 02/23/2015 colonoscopy to evaluate unexplained bleeding: Findings of 4 angiodysplastic lesions at the cecum, sessile polyp in the ascending colon, mild diverticulosis in the sigmoid colon and grade 1 internal hemorrhoids; diagnosed with chronic blood loss from AVMs recommended long-term iron replacement and monitor CBCs per PCP 02/23/2015 EGD: AVM in the second part of the duodenum, hiatal hernia  Past Medical History:  Diagnosis Date  . Angiodysplasia of cecum   . Angiodysplasia of duodenum   . Asteatotic eczema 02/25/2016  . Atrial fibrillation ([place]) 10/17/2017  . Chest pain  at rest 03/22/2010   Qualifier: Diagnosis of  By: Ronnald Ramp MD, Arvid Right.   . CHF (congestive heart failure) ([place]) 08/31/03  . CHF (congestive heart failure), NYHA class III, chronic, combined ([place]) 08/10/2017  . Chronic venous stasis dermatitis of both lower extremities 06/02/2013  . CKD (chronic kidney disease), stage III ([place]) 06/20/2010   Estimated Creatinine Clearance: 34.8 mL/min (A) (by C-G formula based on SCr of 1.37 mg/dL (H)).   Marland Kitchen COLD (chronic obstructive lung disease) ([place])   . Colonic polyp 02/16/2008   Tubular adenoma  . COPD (chronic obstructive pulmonary disease) ([place]) 01/27/98  . Cor pulmonale ([place]) 11/16/2014  . D-dimer, elevated 01/01/2018  . GERD 07/30/1992  . GERD (gastroesophageal reflux disease) 07/30/92  . Gout   . HTN (hypertension) 07/30/1988  . Hyperlipidemia 11/27/96  . Hyperlipidemia with target LDL less than 100 11/27/1996       . Kidney stone 1960, 1972, 1991  . MGUS (monoclonal gammopathy of unknown significance) 11/08/2016  . Morbid obesity ([place]) 04/19/2010   She agrees to work on her lifestyle modifications to help her lose weight.   . Neck pain on right side 01/01/2018  . OSA (obstructive sleep apnea) 06/22/2013  . Osteopenia, senile 02/05/2013   July 2014  -2.1 left femur -1.9 left forearm   . Other screening mammogram 02/05/2013  . Prediabetes 11/13/2006  . Renal insufficiency   . Stenosis of cervical spine with myelopathy ([place]) 01/01/2018  . Ulcer of leg, chronic, right ([place]) 10/10/2011  .  Ulcerative colitis   . ULCERATIVE COLITIS-LEFT SIDE 03/19/2008       . Vitamin B12 deficiency anemia 11/13/2006       . Vitamin D deficiency 02/06/2013    Past Surgical History:  Procedure Laterality Date  . BRONCHOSCOPY    . COLONOSCOPY WITH PROPOFOL N/A 02/23/2015   Procedure: COLONOSCOPY WITH PROPOFOL;  Surgeon: Ladene Artist, MD;  Location: [facility];  Service: Endoscopy;  Laterality: N/A;  . ESOPHAGOGASTRODUODENOSCOPY (EGD) WITH PROPOFOL N/A 02/23/2015   Procedure:  ESOPHAGOGASTRODUODENOSCOPY (EGD) WITH PROPOFOL;  Surgeon: Ladene Artist, MD;  Location: [facility];  Service: Endoscopy;  Laterality: N/A;  . NO PAST SURGERIES      Family History  Problem Relation Age of Onset  . Stroke Mother   . Diabetes Mother   . Kidney cancer Mother   . Stomach cancer Mother   . Heart disease Mother   . Stroke Father   . Heart disease Brother   . Heart disease Brother   . Crohn's disease Brother   . Heart disease Sister        CATH,STENT  . Clotting disorder Sister   . Cervical cancer Sister   . Lung cancer Sister   . Heart attack Sister   . Diabetes Sister   . Colon cancer Neg Hx     Social History   Tobacco Use  . Smoking status: Former Smoker    Last attempt to quit: 06/01/1996    Years since quitting: 22.1  . Smokeless tobacco: Never Used  . Tobacco comment: Quit 1998  Substance Use Topics  . Alcohol use: No  . Drug use: No    Prior to Admission medications   Medication Sig Start Date End Date Taking? Authorizing Provider  acetaminophen (TYLENOL) 325 MG tablet Take 650 mg by mouth every 6 (six) hours as needed for moderate pain, fever or headache.    [provider]  albuterol (PROAIR HFA) 108 (90 Base) MCG/ACT inhaler Inhale 1-2 puffs into the lungs every 6 (six) hours as needed for wheezing or shortness of breath. 10/30/16   [name], MD  albuterol (PROVENTIL) (2.5 MG/3ML) 0.083% nebulizer solution Take 3 mLs (2.5 mg total) by nebulization every 6 (six) hours as needed for wheezing or shortness of breath. 07/18/17   [name], MD  allopurinol (ZYLOPRIM) 100 MG tablet TAKE 1 TABLET BY MOUTH EVERY DAY FOR GOUT 06/05/18   [name], MD  allopurinol (ZYLOPRIM) 100 MG tablet TAKE 1 TABLET BY MOUTH EVERY DAY FOR GOUT 06/05/18   [name], MD  BIDIL 20-37.5 MG tablet TAKE 1 TABLET BY MOUTH THREE TIMES DAILY 04/11/18   [name], MD  cyanocobalamin 2000 MCG tablet Take 1 tablet (2,000 mcg total) by mouth  daily. Patient taking differently: Take 2,000 mcg by mouth every evening.  02/24/16   [name], MD  Dietary Management Product (VASCULERA) TABS TAKE ONE TABLET BY MOUTH EVERY DAY 06/18/18   [name], MD  ferrous sulfate 325 (65 FE) MG tablet Take 1 tablet (325 mg total) by mouth 2 (two) times daily with a meal. 02/05/18   [name], MD  fluticasone furoate-vilanterol (BREO ELLIPTA) 100-25 MCG/INH AEPB Inhale 1 puff into the lungs daily. 05/13/18   [name], MD  metoprolol tartrate (LOPRESSOR) 25 MG tablet Take 0.5 tablets (12.5 mg total) by mouth 2 (two) times daily. 03/03/18 06/01/18  Allred, Jeneen Rinks, MD  Pitavastatin Calcium (LIVALO) 1 MG TABS Take  1 tablet (1 mg total) by mouth daily. 02/05/18   [name], MD  torsemide (DEMADEX) 20 MG tablet TAKE 1 TABLET(20 MG) BY MOUTH TWICE DAILY 07/31/18   [name], MD  Vitamin D, Ergocalciferol, (DRISDOL) 50000 units CAPS capsule TAKE 50,000 CAPSULE BY MOUTH ONCE WEEKLY 02/20/18   [name], MD    Current Facility-Administered Medications  Medication Dose Route Frequency Provider Last Rate Last Dose  . morphine 2 MG/ML injection 2 mg  2 mg Intravenous Once Tyrone Nine, Dan, DO      . ondansetron [facility]) injection 4 mg  4 mg Intravenous Once Tyrone Nine, Dan, DO      . pantoprazole (PROTONIX) injection 40 mg  40 mg Intravenous Once Den[place] Etienne, DO      . sodium chloride 0.9 % bolus 1,000 mL  1,000 mL Intravenous Once [place]o Etienne, DO 1,935.5 mL/hr at 08/04/18 1230 1,000 mL at 08/04/18 1230   Current Outpatient Medications  Medication Sig Dispense Refill  . acetaminophen (TYLENOL) 325 MG tablet Take 650 mg by mouth every 6 (six) hours as needed for moderate pain, fever or headache.    . albuterol (PROAIR HFA) 108 (90 Base) MCG/ACT inhaler Inhale 1-2 puffs into the lungs every 6 (six) hours as needed for wheezing or shortness of breath. 18 g 11  . albuterol (PROVENTIL) (2.5 MG/3ML) 0.083% nebulizer solution Take 3 mLs (2.5 mg total)  by nebulization every 6 (six) hours as needed for wheezing or shortness of breath. 150 mL 11  . allopurinol (ZYLOPRIM) 100 MG tablet TAKE 1 TABLET BY MOUTH EVERY DAY FOR GOUT 90 tablet 0  . allopurinol (ZYLOPRIM) 100 MG tablet TAKE 1 TABLET BY MOUTH EVERY DAY FOR GOUT 90 tablet 0  . BIDIL 20-37.5 MG tablet TAKE 1 TABLET BY MOUTH THREE TIMES DAILY 270 tablet 0  . cyanocobalamin 2000 MCG tablet Take 1 tablet (2,000 mcg total) by mouth daily. (Patient taking differently: Take 2,000 mcg by mouth every evening. ) 90 tablet 3  . Dietary Management Product (VASCULERA) TABS TAKE ONE TABLET BY MOUTH EVERY DAY 90 tablet 1  . ferrous sulfate 325 (65 FE) MG tablet Take 1 tablet (325 mg total) by mouth 2 (two) times daily with a meal. 180 tablet 1  . fluticasone furoate-vilanterol (BREO ELLIPTA) 100-25 MCG/INH AEPB Inhale 1 puff into the lungs daily. 60 each 11  . metoprolol tartrate (LOPRESSOR) 25 MG tablet Take 0.5 tablets (12.5 mg total) by mouth 2 (two) times daily. 90 tablet 3  . Pitavastatin Calcium (LIVALO) 1 MG TABS Take 1 tablet (1 mg total) by mouth daily. 90 tablet 1  . torsemide (DEMADEX) 20 MG tablet TAKE 1 TABLET(20 MG) BY MOUTH TWICE DAILY 180 tablet 0  . Vitamin D, Ergocalciferol, (DRISDOL) 50000 units CAPS capsule TAKE 50,000 CAPSULE BY MOUTH ONCE WEEKLY 12 capsule 1    Allergies as of 08/04/2018 - Review Complete 08/04/2018  Allergen Reaction Noted  . Celebrex [celecoxib]  10/10/2011  . Enalapril Cough 02/19/2011  . Lipitor [atorvastatin]  04/14/2014  . Amlodipine besylate  05/25/2010  . Amoxicillin    . Codeine sulfate    . Hydrocodone-acetaminophen     Review of Systems:    Constitutional: No weight loss, fever or chills Skin: No rash  Cardiovascular: No chest pain Respiratory: +chronic SOB on O2 at 2L Gastrointestinal: See HPI and otherwise negative Genitourinary: No dysuria  Neurological: No headache Musculoskeletal: No new muscle or joint pain Hematologic: No  bruising Psychiatric: No history of  depression or anxiety    Physical Exam:  Vital signs in last 24 hours: Temp:  [97.9 F (36.6 C)] 97.9 F (36.6 C) (01/06 1135) Pulse Rate:  [60-68] 68 (01/06 1200) Resp:  [17-19] 17 (01/06 1200) BP: (111-123)/(44-57) 111/44 (01/06 1200) SpO2:  [100 %] 100 % (01/06 1200)   General:   Obese, chronically ill appearing, Caucasian female appears to be in NAD, Well developed, Well nourished, alert and cooperative Head:  Normocephalic and atraumatic. Eyes:   PEERL, EOMI. No icterus. Conjunctiva pink. Ears:  Normal auditory acuity. Neck:  Supple Throat: Oral cavity and pharynx without inflammation, swelling or lesion.  Lungs: Respirations even and unlabored. Lungs clear to auscultation bilaterally.   No wheezes, crackles, or rhonchi. +2L o2 via [place] Heart: Normal S1, S2. No MRG. Regular rate and rhythm. No peripheral edema, cyanosis or pallor.  Abdomen:  Soft, nondistended, mild b/l lower abdominal ttp. No rebound or guarding. Normal bowel sounds. No appreciable masses or hepatomegaly. Rectal:  Per ED: BRB, hemoccult + Msk:  Symmetrical without gross deformities. Peripheral pulses intact.  Extremities:  Without edema, no deformity or joint abnormality. Right Inguinal hernia Neurologic:  Alert and  oriented x4;  grossly normal neurologically.  Skin:   Dry and intact without significant lesions or rashes. Psychiatric: Demonstrates good judgement and reason without abnormal affect or behaviors.   LAB RESULTS: Recent Labs    08/04/18 1154  WBC 11.1*  HGB 7.7*  HCT 25.1*  PLT 198   BMET Recent Labs    08/04/18 1154  NA 138  K 4.8  CL 102  CO2 28  GLUCOSE 133*  BUN 49*  CREATININE 1.60*  CALCIUM 8.8*   LFT Recent Labs    08/04/18 1154  PROT 6.4*  ALBUMIN 3.4*  AST 16  ALT 10  ALKPHOS 52  BILITOT 0.4    Impression / Plan:   Impression: 1.  Hematochezia: 7 episodes in the past 12 hours, initially melenic, now brighter in color, known  AVMs in the cecum as well as duodenum, admission back in January 2019 with no further work-up by GI, patient is on chronic iron supplementation; likely bleeding is from known AVMs 2.  Lower abdominal pain 3.  COPD: On O2 chronically  Plan: 1.  At this time we will monitor the patient overnight including serial hemoglobin with transfusion less than 7 2.  We will hold the patient n.p.o. after midnight in case an enteroscopy is pursued tomorrow due to continued bleeding 3.  Patient will be high risk for procedures given her comorbidities and oxygen use, given this we will try to hold off, but if patient continues to bleed she will need further evaluation. 4.  Patient may be on a clear liquid diet today. 5.  Please await any further recommendations from Dr. Tarri Glenn this morning.  Dr. Carlean Purl will be following the patient thereafter.  Thank you for your kind consultation, we will continue to follow.  Lavone Nian [facility]  08/04/2018, 12:31 PM

## 2018-08-04 NOTE — Telephone Encounter (Signed)
Patient C/O 4 bloody stools since midnight.  Dark red in color x3 and last time is was bright red, patient also C/O stomach cramps.  Patient states the toilet water does turn color. / Patient says she had the same issue in January 2019.  Patient wants to know if blood work can be done at the doctors office instead of going to the ED. / Spoke with Lovena Le who verified that patient needs to go to ED (she did verify with Dr. Ronnald Ramp CMA.) / Patient states she will go to the ED after she gets her daughter situated with care.  Patient was instructed to change positions slowly, be careful driving if she has to drive herself.  Reason for Disposition . [1] MODERATE rectal bleeding (small blood clots, passing blood without stool, or toilet water turns red) AND [2] more than once a day . [1] Constant abdominal pain AND [2] present > 2 hours  Answer Assessment - Initial Assessment Questions 1. APPEARANCE of BLOOD: "What color is it?" "Is it passed separately, on the surface of the stool, or mixed in with the stool?"      Dark red mixed with stool x3, and then once bright red 2. AMOUNT: "How much blood was passed?"      Colored toilet water 3. FREQUENCY: "How many times has blood been passed with the stools?"      4 4. ONSET: "When was the blood first seen in the stools?" (Days or weeks)  Started at [place] 5. DIARRHEA: "Is there also some diarrhea?" If so, ask: "How many diarrhea stools were passed in past 24 hours?"     no 6. CONSTIPATION: "Do you have constipation?" If so, "How bad is it?"   no 7. RECURRENT SYMPTOMS: "Have you had blood in your stools before?" If so, ask: "When was the last time?" and "What happened that time?"     Yes, January 2019 8. BLOOD THINNERS: "Do you take any blood thinners?" (e.g., Coumadin/warfarin, Pradaxa/dabigatran, aspirin)    no 9. OTHER SYMPTOMS: "Do you have any other symptoms?"  (e.g., abdominal pain, vomiting, dizziness, fever)     Stomach cramping,  faint  Protocols used: RECTAL BLEEDING-A-AH

## 2018-08-04 NOTE — ED Notes (Signed)
ED TO INPATIENT HANDOFF REPORT  Name/Age/Gender [name] 78 y.o. female  Code Status Code Status History    Date Active Date Inactive Code Status Order ID Comments User Context   10/17/2017 2309 10/20/2017 2047 DNR [id number]  Rise Patience, MD ED   08/09/2017 0040 08/15/2017 1555 DNR [id number]  Elwin Mocha, MD ED   11/08/2016 2026 11/11/2016 2001 Partial Code [id number]  Vianne Bulls, MD ED   03/19/2016 2109 03/22/2016 2157 Partial Code [id number]  Toy Baker, MD Inpatient   11/16/2014 1524 11/23/2014 1638 DNR [id number]  Samella Parr, NP Inpatient   06/01/2013 2001 06/05/2013 1502 Full Code [id number]  Aldean Jewett, MD ED    Questions for Most Recent Historical Code Status (Order [id number])    Question Answer Comment   In the event of cardiac or respiratory ARREST Do not call a "code blue"    In the event of cardiac or respiratory ARREST Do not perform Intubation, CPR, defibrillation or ACLS    In the event of cardiac or respiratory ARREST Use medication by any route, position, wound care, and other measures to relive pain and suffering. May use oxygen, suction and manual treatment of airway obstruction as needed for comfort.       Home/SNF/Other Home  Chief Complaint Blood in Stool  Level of Care/Admitting Diagnosis ED Disposition    ED Disposition Condition [facility] Area: [facility] [100102]  Level of Care: Med-Surg [16]  Diagnosis: Hematochezia [672094]  Admitting Physician: Edwin Dada [[id number]]  Attending Physician: Edwin Dada [[id number]]  PT Class (Do Not Modify): Observation [104]  PT Acc Code (Do Not Modify): Observation [10022]       Medical History Past Medical History:  Diagnosis Date  . Angiodysplasia of cecum   . Angiodysplasia of duodenum   . Asteatotic eczema 02/25/2016  . Atrial fibrillation ([place]) 10/17/2017  . Chest pain at rest 03/22/2010   Qualifier: Diagnosis of   By: Ronnald Ramp MD, Arvid Right.   . CHF (congestive heart failure) ([place]) 08/31/03  . CHF (congestive heart failure), NYHA class III, chronic, combined ([place]) 08/10/2017  . Chronic venous stasis dermatitis of both lower extremities 06/02/2013  . CKD (chronic kidney disease), stage III ([place]) 06/20/2010   Estimated Creatinine Clearance: 34.8 mL/min (A) (by C-G formula based on SCr of 1.37 mg/dL (H)).   Marland Kitchen COLD (chronic obstructive lung disease) ([place])   . Colonic polyp 02/16/2008   Tubular adenoma  . COPD (chronic obstructive pulmonary disease) ([place]) 01/27/98  . Cor pulmonale ([place]) 11/16/2014  . D-dimer, elevated 01/01/2018  . GERD 07/30/1992  . GERD (gastroesophageal reflux disease) 07/30/92  . Gout   . HTN (hypertension) 07/30/1988  . Hyperlipidemia 11/27/96  . Hyperlipidemia with target LDL less than 100 11/27/1996       . Kidney stone 1960, 1972, 1991  . MGUS (monoclonal gammopathy of unknown significance) 11/08/2016  . Morbid obesity ([place]) 04/19/2010   She agrees to work on her lifestyle modifications to help her lose weight.   . Neck pain on right side 01/01/2018  . OSA (obstructive sleep apnea) 06/22/2013  . Osteopenia, senile 02/05/2013   July 2014  -2.1 left femur -1.9 left forearm   . Other screening mammogram 02/05/2013  . Prediabetes 11/13/2006  . Renal insufficiency   . Stenosis of cervical spine with myelopathy ([place]) 01/01/2018  . Ulcer of leg, chronic, right ([place]) 10/10/2011  . Ulcerative colitis   . ULCERATIVE  COLITIS-LEFT SIDE 03/19/2008       . Vitamin B12 deficiency anemia 11/13/2006       . Vitamin D deficiency 02/06/2013    Allergies Allergies  Allergen Reactions  . Amoxicillin Diarrhea    DID THE REACTION INVOLVE: Swelling of the face/tongue/throat, SOB, or low BP? N Sudden or severe rash/hives, skin peeling, or the inside of the mouth or nose? N Did it require medical treatment? N When did it last happen?MORE THAN 10 YEARS If all above answers are "NO", may proceed with cephalosporin  use.   . Celebrex [Celecoxib]     edema  . Enalapril Cough  . Lipitor [Atorvastatin]     Muscle aches  . Amlodipine Besylate     REACTION: edema  . Codeine Sulfate     REACTION: Nausea  . Hydrocodone-Acetaminophen     REACTION: Nausea    IV Location/Drains/Wounds Patient Lines/Drains/Airways Status   Active Line/Drains/Airways    Name:   Placement date:   Placement time:   Site:   Days:   Peripheral IV 08/04/18 Left Antecubital   08/04/18    1156    Antecubital   less than 1   External Urinary Catheter   10/18/17    0224    -   290   Wound 06/01/13 Other (Comment) Leg Right;Left;Anterior min drainage   06/01/13    2200    Leg   1890   Wound / Incision (Open or Dehisced) 11/22/14 Puncture Abdomen Right;Left;Mid bleeding from heparin injection sites   11/22/14    1000    Abdomen   1351   Wound / Incision (Open or Dehisced) 11/08/16 Other (Comment) Leg Right;Left cellulist/dermatitis   11/08/16    2330    Leg   634          Labs/Imaging Results for orders placed or performed during the hospital encounter of 08/04/18 (from the past 48 hour(s))  Comprehensive metabolic panel     Status: Abnormal   Collection Time: 08/04/18 11:54 AM  Result Value Ref Range   Sodium 138 135 - 145 mmol/L   Potassium 4.8 3.5 - 5.1 mmol/L   Chloride 102 98 - 111 mmol/L   CO2 28 22 - 32 mmol/L   Glucose, Bld 133 (H) 70 - 99 mg/dL   BUN 49 (H) 8 - 23 mg/dL   Creatinine, Ser 1.60 (H) 0.44 - 1.00 mg/dL   Calcium 8.8 (L) 8.9 - 10.3 mg/dL   Total Protein 6.4 (L) 6.5 - 8.1 g/dL   Albumin 3.4 (L) 3.5 - 5.0 g/dL   AST 16 15 - 41 U/L   ALT 10 0 - 44 U/L   Alkaline Phosphatase 52 38 - 126 U/L   Total Bilirubin 0.4 0.3 - 1.2 mg/dL   GFR calc non Af Amer 31 (L) >60 mL/min   GFR calc Af Amer 35 (L) >60 mL/min   Anion gap 8 5 - 15    Comment: Performed at [facility], [place] [address] [place], [place] [address]  CBC     Status: Abnormal   Collection Time: 08/04/18 11:54 AM  Result  Value Ref Range   WBC 11.1 (H) 4.0 - 10.5 K/uL   RBC 2.88 (L) 3.87 - 5.11 MIL/uL   Hemoglobin 7.7 (L) 12.0 - 15.0 g/dL   HCT 25.1 (L) 36.0 - 46.0 %   MCV 87.2 80.0 - 100.0 fL   MCH 26.7 26.0 - 34.0 pg   MCHC 30.7 30.0 -  36.0 g/dL   RDW 13.9 11.5 - 15.5 %   Platelets 198 150 - 400 K/uL   nRBC 0.0 0.0 - 0.2 %    Comment: Performed at [facility], [place] [address], [place], [place] [address]  Type and screen [place]     Status: None   Collection Time: 08/04/18 11:56 AM  Result Value Ref Range   ABO/RH(D) O POS    Antibody Screen POS    Sample Expiration 08/07/2018    Antibody Identification      ANTI K Performed at [facility], [place] [address] [place], [place] [address]   POC occult blood, ED     Status: Abnormal   Collection Time: 08/04/18 12:19 PM  Result Value Ref Range   Fecal Occult Bld POSITIVE (A) NEGATIVE  Hemoglobin     Status: Abnormal   Collection Time: 08/04/18  2:11 PM  Result Value Ref Range   Hemoglobin 6.9 (LL) 12.0 - 15.0 g/dL    Comment: This critical result has verified and been called to ROSTER,M. RN by Sarita Bottom on 01 06 2020 at 1423, and has been read back. CRITICAL RESULT VERIFIED Performed at [facility], [place] [address] [place], [place] [address]    No results found.  Pending Labs Unresulted Labs (From admission, onward)    Start     Ordered   08/04/18 1326  Hemoglobin  Now then every 6 hours,   R     08/04/18 1325          Vitals/Pain Today's Vitals   08/04/18 1135 08/04/18 1200 08/04/18 1245 08/04/18 1500  BP: (!) 123/57 (!) 111/44 (!) 121/52 (!) 107/41  Pulse: 60 68 63 62  Resp: 19 17 (!) 21 14  Temp: 97.9 F (36.6 C)     TempSrc: Oral     SpO2: 100% 100% 100% 100%    Isolation Precautions No active isolations  Medications Medications  sodium chloride 0.9 % bolus 1,000 mL (0 mLs Intravenous Stopped 08/04/18 1410)  morphine 2 MG/ML injection 2 mg  (2 mg Intravenous Given 08/04/18 1230)  ondansetron (ZOFRAN) injection 4 mg (4 mg Intravenous Given 08/04/18 1230)  pantoprazole (PROTONIX) injection 40 mg (40 mg Intravenous Given 08/04/18 1233)    Mobility walks with device

## 2018-08-04 NOTE — Consult Note (Signed)
Consultation  Referring Provider: Dr. Tyrone Nine      Primary Care Physician:  [name], MD Primary Gastroenterologist:  Dr. Fuller Plan       Reason for Consultation:  Hematochezia, Anemia            HPI:   [name] is a 78 y.o. female with a past medical history as listed below including A. fib (echo 10/18/2017 LVEF 65-70%), CHF, CKD stage III, chronic obstructive lung disease on O2, ulcerative colitis diagnosed back in 2009 (never on maintenance med), with repeat colonoscopy in 2016 with no signs of colitis and others listed below, who presented to the ED today with a complaint of rectal bleeding.    Today, patient describes that about 12 hours ago she started with dark "bloody" bowel movements around 11 PM last night, since then she has had 7 bowel movements all bloody, though it is getting somewhat lighter in appearance.  Prior to this did have some lower abdominal pain which started around 10 PM and she felt very bloated, continues with only minimal lower abdominal pain now, rated as a 3-4/10.  Associated symptoms include weakness.    Denies fever, chills, weight loss, anorexia, nausea, vomiting, heartburn or reflux.  ER course: Hemoccult positive, CMP with a BUN elevated at 49, creatinine 1.6 (baseline around 1.4), CBC with a white count elevated 11.1, hemoglobin low at 7.7 (10.1 6 months ago)  GI history: 02/23/2015 colonoscopy to evaluate unexplained bleeding: Findings of 4 angiodysplastic lesions at the cecum, sessile polyp in the ascending colon, mild diverticulosis in the sigmoid colon and grade 1 internal hemorrhoids; diagnosed with chronic blood loss from AVMs recommended long-term iron replacement and monitor CBCs per PCP 02/23/2015 EGD: AVM in the second part of the duodenum, hiatal hernia  Past Medical History:  Diagnosis Date  . Angiodysplasia of cecum   . Angiodysplasia of duodenum   . Asteatotic eczema 02/25/2016  . Atrial fibrillation ([place]) 10/17/2017  . Chest pain  at rest 03/22/2010   Qualifier: Diagnosis of  By: Ronnald Ramp MD, Arvid Right.   . CHF (congestive heart failure) ([place]) 08/31/03  . CHF (congestive heart failure), NYHA class III, chronic, combined ([place]) 08/10/2017  . Chronic venous stasis dermatitis of both lower extremities 06/02/2013  . CKD (chronic kidney disease), stage III ([place]) 06/20/2010   Estimated Creatinine Clearance: 34.8 mL/min (A) (by C-G formula based on SCr of 1.37 mg/dL (H)).   Marland Kitchen COLD (chronic obstructive lung disease) ([place])   . Colonic polyp 02/16/2008   Tubular adenoma  . COPD (chronic obstructive pulmonary disease) ([place]) 01/27/98  . Cor pulmonale ([place]) 11/16/2014  . D-dimer, elevated 01/01/2018  . GERD 07/30/1992  . GERD (gastroesophageal reflux disease) 07/30/92  . Gout   . HTN (hypertension) 07/30/1988  . Hyperlipidemia 11/27/96  . Hyperlipidemia with target LDL less than 100 11/27/1996       . Kidney stone 1960, 1972, 1991  . MGUS (monoclonal gammopathy of unknown significance) 11/08/2016  . Morbid obesity ([place]) 04/19/2010   She agrees to work on her lifestyle modifications to help her lose weight.   . Neck pain on right side 01/01/2018  . OSA (obstructive sleep apnea) 06/22/2013  . Osteopenia, senile 02/05/2013   July 2014  -2.1 left femur -1.9 left forearm   . Other screening mammogram 02/05/2013  . Prediabetes 11/13/2006  . Renal insufficiency   . Stenosis of cervical spine with myelopathy ([place]) 01/01/2018  . Ulcer of leg, chronic, right ([place]) 10/10/2011  .  Ulcerative colitis   . ULCERATIVE COLITIS-LEFT SIDE 03/19/2008       . Vitamin B12 deficiency anemia 11/13/2006       . Vitamin D deficiency 02/06/2013    Past Surgical History:  Procedure Laterality Date  . BRONCHOSCOPY    . COLONOSCOPY WITH PROPOFOL N/A 02/23/2015   Procedure: COLONOSCOPY WITH PROPOFOL;  Surgeon: Ladene Artist, MD;  Location: [facility];  Service: Endoscopy;  Laterality: N/A;  . ESOPHAGOGASTRODUODENOSCOPY (EGD) WITH PROPOFOL N/A 02/23/2015   Procedure:  ESOPHAGOGASTRODUODENOSCOPY (EGD) WITH PROPOFOL;  Surgeon: Ladene Artist, MD;  Location: [facility];  Service: Endoscopy;  Laterality: N/A;  . NO PAST SURGERIES      Family History  Problem Relation Age of Onset  . Stroke Mother   . Diabetes Mother   . Kidney cancer Mother   . Stomach cancer Mother   . Heart disease Mother   . Stroke Father   . Heart disease Brother   . Heart disease Brother   . Crohn's disease Brother   . Heart disease Sister        CATH,STENT  . Clotting disorder Sister   . Cervical cancer Sister   . Lung cancer Sister   . Heart attack Sister   . Diabetes Sister   . Colon cancer Neg Hx     Social History   Tobacco Use  . Smoking status: Former Smoker    Last attempt to quit: 06/01/1996    Years since quitting: 22.1  . Smokeless tobacco: Never Used  . Tobacco comment: Quit 1998  Substance Use Topics  . Alcohol use: No  . Drug use: No    Prior to Admission medications   Medication Sig Start Date End Date Taking? Authorizing Provider  acetaminophen (TYLENOL) 325 MG tablet Take 650 mg by mouth every 6 (six) hours as needed for moderate pain, fever or headache.    [provider]  albuterol (PROAIR HFA) 108 (90 Base) MCG/ACT inhaler Inhale 1-2 puffs into the lungs every 6 (six) hours as needed for wheezing or shortness of breath. 10/30/16   [name], MD  albuterol (PROVENTIL) (2.5 MG/3ML) 0.083% nebulizer solution Take 3 mLs (2.5 mg total) by nebulization every 6 (six) hours as needed for wheezing or shortness of breath. 07/18/17   [name], MD  allopurinol (ZYLOPRIM) 100 MG tablet TAKE 1 TABLET BY MOUTH EVERY DAY FOR GOUT 06/05/18   [name], MD  allopurinol (ZYLOPRIM) 100 MG tablet TAKE 1 TABLET BY MOUTH EVERY DAY FOR GOUT 06/05/18   [name], MD  BIDIL 20-37.5 MG tablet TAKE 1 TABLET BY MOUTH THREE TIMES DAILY 04/11/18   [name], MD  cyanocobalamin 2000 MCG tablet Take 1 tablet (2,000 mcg total) by mouth  daily. Patient taking differently: Take 2,000 mcg by mouth every evening.  02/24/16   [name], MD  Dietary Management Product (VASCULERA) TABS TAKE ONE TABLET BY MOUTH EVERY DAY 06/18/18   [name], MD  ferrous sulfate 325 (65 FE) MG tablet Take 1 tablet (325 mg total) by mouth 2 (two) times daily with a meal. 02/05/18   [name], MD  fluticasone furoate-vilanterol (BREO ELLIPTA) 100-25 MCG/INH AEPB Inhale 1 puff into the lungs daily. 05/13/18   [name], MD  metoprolol tartrate (LOPRESSOR) 25 MG tablet Take 0.5 tablets (12.5 mg total) by mouth 2 (two) times daily. 03/03/18 06/01/18  Allred, Jeneen Rinks, MD  Pitavastatin Calcium (LIVALO) 1 MG TABS Take  1 tablet (1 mg total) by mouth daily. 02/05/18   [name], MD  torsemide (DEMADEX) 20 MG tablet TAKE 1 TABLET(20 MG) BY MOUTH TWICE DAILY 07/31/18   [name], MD  Vitamin D, Ergocalciferol, (DRISDOL) 50000 units CAPS capsule TAKE 50,000 CAPSULE BY MOUTH ONCE WEEKLY 02/20/18   [name], MD    Current Facility-Administered Medications  Medication Dose Route Frequency Provider Last Rate Last Dose  . morphine 2 MG/ML injection 2 mg  2 mg Intravenous Once Tyrone Nine, Dan, DO      . ondansetron [facility]) injection 4 mg  4 mg Intravenous Once Tyrone Nine, Dan, DO      . pantoprazole (PROTONIX) injection 40 mg  40 mg Intravenous Once Deno Etienne, DO      . sodium chloride 0.9 % bolus 1,000 mL  1,000 mL Intravenous Once Deno Etienne, DO 1,935.5 mL/hr at 08/04/18 1230 1,000 mL at 08/04/18 1230   Current Outpatient Medications  Medication Sig Dispense Refill  . acetaminophen (TYLENOL) 325 MG tablet Take 650 mg by mouth every 6 (six) hours as needed for moderate pain, fever or headache.    . albuterol (PROAIR HFA) 108 (90 Base) MCG/ACT inhaler Inhale 1-2 puffs into the lungs every 6 (six) hours as needed for wheezing or shortness of breath. 18 g 11  . albuterol (PROVENTIL) (2.5 MG/3ML) 0.083% nebulizer solution Take 3 mLs (2.5 mg total)  by nebulization every 6 (six) hours as needed for wheezing or shortness of breath. 150 mL 11  . allopurinol (ZYLOPRIM) 100 MG tablet TAKE 1 TABLET BY MOUTH EVERY DAY FOR GOUT 90 tablet 0  . allopurinol (ZYLOPRIM) 100 MG tablet TAKE 1 TABLET BY MOUTH EVERY DAY FOR GOUT 90 tablet 0  . BIDIL 20-37.5 MG tablet TAKE 1 TABLET BY MOUTH THREE TIMES DAILY 270 tablet 0  . cyanocobalamin 2000 MCG tablet Take 1 tablet (2,000 mcg total) by mouth daily. (Patient taking differently: Take 2,000 mcg by mouth every evening. ) 90 tablet 3  . Dietary Management Product (VASCULERA) TABS TAKE ONE TABLET BY MOUTH EVERY DAY 90 tablet 1  . ferrous sulfate 325 (65 FE) MG tablet Take 1 tablet (325 mg total) by mouth 2 (two) times daily with a meal. 180 tablet 1  . fluticasone furoate-vilanterol (BREO ELLIPTA) 100-25 MCG/INH AEPB Inhale 1 puff into the lungs daily. 60 each 11  . metoprolol tartrate (LOPRESSOR) 25 MG tablet Take 0.5 tablets (12.5 mg total) by mouth 2 (two) times daily. 90 tablet 3  . Pitavastatin Calcium (LIVALO) 1 MG TABS Take 1 tablet (1 mg total) by mouth daily. 90 tablet 1  . torsemide (DEMADEX) 20 MG tablet TAKE 1 TABLET(20 MG) BY MOUTH TWICE DAILY 180 tablet 0  . Vitamin D, Ergocalciferol, (DRISDOL) 50000 units CAPS capsule TAKE 50,000 CAPSULE BY MOUTH ONCE WEEKLY 12 capsule 1    Allergies as of 08/04/2018 - Review Complete 08/04/2018  Allergen Reaction Noted  . Celebrex [celecoxib]  10/10/2011  . Enalapril Cough 02/19/2011  . Lipitor [atorvastatin]  04/14/2014  . Amlodipine besylate  05/25/2010  . Amoxicillin    . Codeine sulfate    . Hydrocodone-acetaminophen     Review of Systems:    Constitutional: No weight loss, fever or chills Skin: No rash  Cardiovascular: No chest pain Respiratory: +chronic SOB on O2 at 2L Gastrointestinal: See HPI and otherwise negative Genitourinary: No dysuria  Neurological: No headache Musculoskeletal: No new muscle or joint pain Hematologic: No  bruising Psychiatric: No history of  depression or anxiety    Physical Exam:  Vital signs in last 24 hours: Temp:  [97.9 F (36.6 C)] 97.9 F (36.6 C) (01/06 1135) Pulse Rate:  [60-68] 68 (01/06 1200) Resp:  [17-19] 17 (01/06 1200) BP: (111-123)/(44-57) 111/44 (01/06 1200) SpO2:  [100 %] 100 % (01/06 1200)   General:   Obese, chronically ill appearing, Caucasian female appears to be in NAD, Well developed, Well nourished, alert and cooperative Head:  Normocephalic and atraumatic. Eyes:   PEERL, EOMI. No icterus. Conjunctiva pink. Ears:  Normal auditory acuity. Neck:  Supple Throat: Oral cavity and pharynx without inflammation, swelling or lesion.  Lungs: Respirations even and unlabored. Lungs clear to auscultation bilaterally.   No wheezes, crackles, or rhonchi. +2L o2 via [place] Heart: Normal S1, S2. No MRG. Regular rate and rhythm. No peripheral edema, cyanosis or pallor.  Abdomen:  Soft, nondistended, mild b/l lower abdominal ttp. No rebound or guarding. Normal bowel sounds. No appreciable masses or hepatomegaly. Rectal:  Per ED: BRB, hemoccult + Msk:  Symmetrical without gross deformities. Peripheral pulses intact.  Extremities:  Without edema, no deformity or joint abnormality. Right Inguinal hernia Neurologic:  Alert and  oriented x4;  grossly normal neurologically.  Skin:   Dry and intact without significant lesions or rashes. Psychiatric: Demonstrates good judgement and reason without abnormal affect or behaviors.   LAB RESULTS: Recent Labs    08/04/18 1154  WBC 11.1*  HGB 7.7*  HCT 25.1*  PLT 198   BMET Recent Labs    08/04/18 1154  NA 138  K 4.8  CL 102  CO2 28  GLUCOSE 133*  BUN 49*  CREATININE 1.60*  CALCIUM 8.8*   LFT Recent Labs    08/04/18 1154  PROT 6.4*  ALBUMIN 3.4*  AST 16  ALT 10  ALKPHOS 52  BILITOT 0.4    Impression / Plan:   Impression: 1.  Hematochezia: 7 episodes in the past 12 hours, initially melenic, now brighter in color, known  AVMs in the cecum as well as duodenum, admission back in January 2019 with no further work-up by GI, patient is on chronic iron supplementation; likely bleeding is from known AVMs 2.  Lower abdominal pain 3.  COPD: On O2 chronically  Plan: 1.  At this time we will monitor the patient overnight including serial hemoglobin with transfusion less than 7 2.  We will hold the patient n.p.o. after midnight in case an enteroscopy is pursued tomorrow due to continued bleeding 3.  Patient will be high risk for procedures given her comorbidities and oxygen use, given this we will try to hold off, but if patient continues to bleed she will need further evaluation. 4.  Patient may be on a clear liquid diet today. 5.  Please await any further recommendations from Dr. Tarri Glenn this morning.  Dr. Carlean Purl will be following the patient thereafter.  Thank you for your kind consultation, we will continue to follow.  Lavone Nian [facility]  08/04/2018, 12:31 PM

## 2018-08-04 NOTE — ED Provider Notes (Signed)
[facility] DEPT Provider Note   CSN: [id number] Arrival date & time: 08/04/18  1116     History   Chief Complaint Chief Complaint  Patient presents with  . Rectal Bleeding    HPI [name] is a 78 y.o. female.  78 yo F with a chief complaint of GI bleeding.  Patient states she has a history of ulcerative colitis and has had some bleeding off and on.  This started last night about 12 hours ago.  She has had about 7 bowel movements.  Initially was very dark and now is bright red.  She is having some left lower quadrant abdominal pain that comes and goes as well.  Denies nausea or vomiting denies blood thinner use.  The history is provided by the patient.  Rectal Bleeding  Quality:  Bright red Amount:  Moderate Duration:  12 hours Timing:  Constant Chronicity:  Recurrent Context: diarrhea   Context: not constipation   Similar prior episodes: yes   Relieved by:  Nothing Worsened by:  Nothing Ineffective treatments:  None tried Associated symptoms: abdominal pain   Associated symptoms: no dizziness, no fever and no vomiting   Risk factors: no anticoagulant use     Past Medical History:  Diagnosis Date  . Angiodysplasia of cecum   . Angiodysplasia of duodenum   . Asteatotic eczema 02/25/2016  . Atrial fibrillation ([place]) 10/17/2017  . Chest pain at rest 03/22/2010   Qualifier: Diagnosis of  By: Ronnald Ramp MD, Arvid Right.   . CHF (congestive heart failure) ([place]) 08/31/03  . CHF (congestive heart failure), NYHA class III, chronic, combined ([place]) 08/10/2017  . Chronic venous stasis dermatitis of both lower extremities 06/02/2013  . CKD (chronic kidney disease), stage III ([place]) 06/20/2010   Estimated Creatinine Clearance: 34.8 mL/min (A) (by C-G formula based on SCr of 1.37 mg/dL (H)).   Marland Kitchen COLD (chronic obstructive lung disease) ([place])   . Colonic polyp 02/16/2008   Tubular adenoma  . COPD (chronic obstructive pulmonary disease) ([place]) 01/27/98  . Cor  pulmonale ([place]) 11/16/2014  . D-dimer, elevated 01/01/2018  . GERD 07/30/1992  . GERD (gastroesophageal reflux disease) 07/30/92  . Gout   . HTN (hypertension) 07/30/1988  . Hyperlipidemia 11/27/96  . Hyperlipidemia with target LDL less than 100 11/27/1996       . Kidney stone 1960, 1972, 1991  . MGUS (monoclonal gammopathy of unknown significance) 11/08/2016  . Morbid obesity ([place]) 04/19/2010   She agrees to work on her lifestyle modifications to help her lose weight.   . Neck pain on right side 01/01/2018  . OSA (obstructive sleep apnea) 06/22/2013  . Osteopenia, senile 02/05/2013   July 2014  -2.1 left femur -1.9 left forearm   . Other screening mammogram 02/05/2013  . Prediabetes 11/13/2006  . Renal insufficiency   . Stenosis of cervical spine with myelopathy ([place]) 01/01/2018  . Ulcer of leg, chronic, right ([place]) 10/10/2011  . Ulcerative colitis   . ULCERATIVE COLITIS-LEFT SIDE 03/19/2008       . Vitamin B12 deficiency anemia 11/13/2006       . Vitamin D deficiency 02/06/2013    Patient Active Problem List   Diagnosis Date Noted  . Hematochezia 08/04/2018  . Multiple lung nodules on CT 05/05/2018  . Stenosis of cervical spine with myelopathy ([place]) 01/01/2018  . Atrial fibrillation ([place]) 10/17/2017  . CHF (congestive heart failure), NYHA class III, chronic, combined ([place]) 08/10/2017  . Angiodysplasia of cecum   . Angiodysplasia  of duodenum   . Grade III diastolic dysfunction 16/04/9603  . MGUS (monoclonal gammopathy of unknown significance) 11/08/2016  . Asteatotic eczema 02/25/2016  . COLD (chronic obstructive lung disease) ([place])   . Anemia, iron deficiency 11/16/2014  . Cor pulmonale ([place]) 11/16/2014  . OSA (obstructive sleep apnea) 06/22/2013  . Chronic venous stasis dermatitis of both lower extremities 06/02/2013  . Vitamin D deficiency 02/06/2013  . Osteopenia, senile 02/05/2013  . Other screening mammogram 02/05/2013  . Ulcer of leg, chronic, right ([place]) 10/10/2011  . CKD (chronic  kidney disease), stage III ([place]) 06/20/2010  . Morbid obesity ([place]) 04/19/2010  . Gout 01/10/2009  . ULCERATIVE COLITIS-LEFT SIDE 03/19/2008  . Vitamin B12 deficiency anemia 11/13/2006  . Prediabetes 11/13/2006  . Hyperlipidemia with target LDL less than 100 11/27/1996  . GERD 07/30/1992  . HTN (hypertension) 07/30/1988    Past Surgical History:  Procedure Laterality Date  . BRONCHOSCOPY    . COLONOSCOPY WITH PROPOFOL N/A 02/23/2015   Procedure: COLONOSCOPY WITH PROPOFOL;  Surgeon: Ladene Artist, MD;  Location: [facility] ENDOSCOPY;  Service: Endoscopy;  Laterality: N/A;  . ESOPHAGOGASTRODUODENOSCOPY (EGD) WITH PROPOFOL N/A 02/23/2015   Procedure: ESOPHAGOGASTRODUODENOSCOPY (EGD) WITH PROPOFOL;  Surgeon: Ladene Artist, MD;  Location: [facility] ENDOSCOPY;  Service: Endoscopy;  Laterality: N/A;  . NO PAST SURGERIES       OB History   No obstetric history on file.      Home Medications    Prior to Admission medications   Medication Sig Start Date End Date Taking? Authorizing Provider  acetaminophen (TYLENOL) 325 MG tablet Take 325-650 mg by mouth daily as needed for moderate pain, fever or headache.    Yes [provider]  albuterol (PROAIR HFA) 108 (90 Base) MCG/ACT inhaler Inhale 1-2 puffs into the lungs every 6 (six) hours as needed for wheezing or shortness of breath. 10/30/16  Yes [name], MD  allopurinol (ZYLOPRIM) 100 MG tablet TAKE 1 TABLET BY MOUTH EVERY DAY FOR GOUT Patient taking differently: Take 100 mg by mouth daily. TAKE 1 TABLET BY MOUTH EVERY DAY FOR GOUT 06/05/18  Yes [name], MD  BIDIL 20-37.5 MG tablet TAKE 1 TABLET BY MOUTH THREE TIMES DAILY 04/11/18  Yes [name], MD  cyanocobalamin 2000 MCG tablet Take 1 tablet (2,000 mcg total) by mouth daily. Patient taking differently: Take 2,000 mcg by mouth every evening.  02/24/16  Yes [name], MD  Dietary Management Product (VASCULERA) TABS TAKE ONE TABLET BY MOUTH EVERY DAY 06/18/18  Yes [name], MD  ferrous sulfate 325 (65 FE) MG tablet Take 1 tablet (325 mg total) by mouth 2 (two) times daily with a meal. 02/05/18  Yes [name], MD  fluticasone furoate-vilanterol (BREO ELLIPTA) 100-25 MCG/INH AEPB Inhale 1 puff into the lungs daily. 05/13/18  Yes [name], MD  metoprolol tartrate (LOPRESSOR) 25 MG tablet Take 0.5 tablets (12.5 mg total) by mouth 2 (two) times daily. 03/03/18 08/04/18 Yes Allred, Jeneen Rinks, MD  torsemide (DEMADEX) 20 MG tablet TAKE 1 TABLET(20 MG) BY MOUTH TWICE DAILY Patient taking differently: Take 40 mg by mouth daily.  07/31/18  Yes [name], MD  Vitamin D, Ergocalciferol, (DRISDOL) 50000 units CAPS capsule TAKE 50,000 CAPSULE BY MOUTH ONCE WEEKLY 02/20/18  Yes [name], MD  albuterol (PROVENTIL) (2.5 MG/3ML) 0.083% nebulizer solution Take 3 mLs (2.5 mg total) by nebulization every 6 (six) hours as needed for wheezing or shortness of breath. Patient  not taking: Reported on 08/04/2018 07/18/17   [name], MD  allopurinol (ZYLOPRIM) 100 MG tablet TAKE 1 TABLET BY MOUTH EVERY DAY FOR GOUT Patient not taking: Reported on 08/04/2018 06/05/18   [name], MD  Pitavastatin Calcium (LIVALO) 1 MG TABS Take 1 tablet (1 mg total) by mouth daily. Patient not taking: Reported on 08/04/2018 02/05/18   [name], MD    Family History Family History  Problem Relation Age of Onset  . Stroke Mother   . Diabetes Mother   . Kidney cancer Mother   . Stomach cancer Mother   . Heart disease Mother   . Stroke Father   . Heart disease Brother   . Heart disease Brother   . Crohn's disease Brother   . Heart disease Sister        CATH,STENT  . Clotting disorder Sister   . Cervical cancer Sister   . Lung cancer Sister   . Heart attack Sister   . Diabetes Sister   . Colon cancer Neg Hx     Social History Social History   Tobacco Use  . Smoking status: Former Smoker    Last attempt to quit: 06/01/1996    Years since quitting: 22.1  .  Smokeless tobacco: Never Used  . Tobacco comment: Quit 1998  Substance Use Topics  . Alcohol use: No  . Drug use: No     Allergies   Amoxicillin; Celebrex [celecoxib]; Enalapril; Lipitor [atorvastatin]; Amlodipine besylate; Codeine sulfate; and Hydrocodone-acetaminophen   Review of Systems Review of Systems  Constitutional: Negative for chills and fever.  HENT: Negative for congestion and rhinorrhea.   Eyes: Negative for redness and visual disturbance.  Respiratory: Negative for shortness of breath and wheezing.   Cardiovascular: Negative for chest pain and palpitations.  Gastrointestinal: Positive for abdominal pain and hematochezia. Negative for nausea and vomiting.  Genitourinary: Negative for dysuria and urgency.  Musculoskeletal: Negative for arthralgias and myalgias.  Skin: Negative for pallor and wound.  Neurological: Negative for dizziness and headaches.     Physical Exam Updated Vital Signs BP (!) 121/52   Pulse 63   Temp 97.9 F (36.6 C) (Oral)   Resp (!) 21   SpO2 100%   Physical Exam Vitals signs and nursing note reviewed.  Constitutional:      General: She is not in acute distress.    Appearance: She is well-developed. She is obese. She is not diaphoretic.     Comments: Chronically ill-appearing  HENT:     Head: Normocephalic and atraumatic.  Eyes:     Pupils: Pupils are equal, round, and reactive to light.  Neck:     Musculoskeletal: Normal range of motion and neck supple.  Cardiovascular:     Rate and Rhythm: Normal rate and regular rhythm.     Heart sounds: No murmur. No friction rub. No gallop.   Pulmonary:     Effort: Pulmonary effort is normal.     Breath sounds: No wheezing or rales.  Abdominal:     General: There is no distension.     Palpations: Abdomen is soft.     Tenderness: There is no abdominal tenderness.  Musculoskeletal:        General: Tenderness present.     Comments: Direct inguinal hernia on the right.  Minimally tender.   No erythema.  Reducible.  Mild tenderness to the left lower quadrant.  Skin:    General: Skin is warm and dry.  Neurological:  Mental Status: She is alert and oriented to person, place, and time.  Psychiatric:        Behavior: Behavior normal.      ED Treatments / Results  Labs (all labs ordered are listed, but only abnormal results are displayed) Labs Reviewed  COMPREHENSIVE METABOLIC PANEL - Abnormal; Notable for the following components:      Result Value   Glucose, Bld 133 (*)    BUN 49 (*)    Creatinine, Ser 1.60 (*)    Calcium 8.8 (*)    Total Protein 6.4 (*)    Albumin 3.4 (*)    GFR calc non Af Amer 31 (*)    GFR calc Af Amer 35 (*)    All other components within normal limits  CBC - Abnormal; Notable for the following components:   WBC 11.1 (*)    RBC 2.88 (*)    Hemoglobin 7.7 (*)    HCT 25.1 (*)    All other components within normal limits  POC OCCULT BLOOD, ED - Abnormal; Notable for the following components:   Fecal Occult Bld POSITIVE (*)    All other components within normal limits  HEMOGLOBIN  HEMOGLOBIN  HEMOGLOBIN  TYPE AND SCREEN    EKG None  Radiology No results found.  Procedures Procedures (including critical care time)  Medications Ordered in ED Medications  sodium chloride 0.9 % bolus 1,000 mL (0 mLs Intravenous Stopped 08/04/18 1410)  morphine 2 MG/ML injection 2 mg (2 mg Intravenous Given 08/04/18 1230)  ondansetron (ZOFRAN) injection 4 mg (4 mg Intravenous Given 08/04/18 1230)  pantoprazole (PROTONIX) injection 40 mg (40 mg Intravenous Given 08/04/18 1233)     Initial Impression / Assessment and Plan / ED Course  I have reviewed the triage vital signs and the nursing notes.  Pertinent labs & imaging results that were available during my care of the patient were reviewed by me and considered in my medical decision making (see chart for details).     78 yo F with a chief complaint of bright red blood per rectum.  Going on for about  12 hours.  Hemoglobin is 3 g below what it was in July.  The patient's BUN is slightly elevated above her baseline looks like normal she is in the 30s and today her lab levels 49.  She was given a dose of Protonix.  Will discuss with GI.  I discussed the case with Anderson Malta from the [facility].  She will come and evaluate the patient.  CRITICAL CARE Performed by: Cecilio Asper   Total critical care time: 35 minutes  Critical care time was exclusive of separately billable procedures and treating other patients.  Critical care was necessary to treat or prevent imminent or life-threatening deterioration.  Critical care was time spent personally by me on the following activities: development of treatment plan with patient and/or surrogate as well as nursing, discussions with consultants, evaluation of patient's response to treatment, examination of patient, obtaining history from patient or surrogate, ordering and performing treatments and interventions, ordering and review of laboratory studies, ordering and review of radiographic studies, pulse oximetry and re-evaluation of patient's condition.  The patients results and plan were reviewed and discussed.   Any x-rays performed were independently reviewed by myself.   Differential diagnosis were considered with the presenting HPI.  Medications  sodium chloride 0.9 % bolus 1,000 mL (0 mLs Intravenous Stopped 08/04/18 1410)  morphine 2 MG/ML injection 2 mg (2 mg Intravenous Given  08/04/18 1230)  ondansetron (ZOFRAN) injection 4 mg (4 mg Intravenous Given 08/04/18 1230)  pantoprazole (PROTONIX) injection 40 mg (40 mg Intravenous Given 08/04/18 1233)    Vitals:   08/04/18 1135 08/04/18 1200 08/04/18 1245  BP: (!) 123/57 (!) 111/44 (!) 121/52  Pulse: 60 68 63  Resp: 19 17 (!) 21  Temp: 97.9 F (36.6 C)    TempSrc: Oral    SpO2: 100% 100% 100%    Final diagnoses:  BRBPR (bright red blood per rectum)  Anemia, blood loss    Admission/  observation were discussed with the admitting physician, patient and/or family and they are comfortable with the plan.   Final Clinical Impressions(s) / ED Diagnoses   Final diagnoses:  BRBPR (bright red blood per rectum)  Anemia, blood loss    ED Discharge Orders    None       Deno Etienne, DO 08/04/18 1420

## 2018-08-05 DIAGNOSIS — K921 Melena: Secondary | ICD-10-CM | POA: Diagnosis not present

## 2018-08-05 DIAGNOSIS — N183 Chronic kidney disease, stage 3 (moderate): Secondary | ICD-10-CM | POA: Diagnosis not present

## 2018-08-05 DIAGNOSIS — D500 Iron deficiency anemia secondary to blood loss (chronic): Secondary | ICD-10-CM | POA: Diagnosis not present

## 2018-08-05 DIAGNOSIS — I480 Paroxysmal atrial fibrillation: Secondary | ICD-10-CM | POA: Diagnosis not present

## 2018-08-05 LAB — HEMOGLOBIN
Hemoglobin: 7.8 g/dL — ABNORMAL LOW (ref 12.0–15.0)
Hemoglobin: 8.6 g/dL — ABNORMAL LOW (ref 12.0–15.0)

## 2018-08-05 LAB — CBC
HCT: 24.8 % — ABNORMAL LOW (ref 36.0–46.0)
Hemoglobin: 7.6 g/dL — ABNORMAL LOW (ref 12.0–15.0)
MCH: 26.5 pg (ref 26.0–34.0)
MCHC: 30.6 g/dL (ref 30.0–36.0)
MCV: 86.4 fL (ref 80.0–100.0)
NRBC: 0 % (ref 0.0–0.2)
Platelets: 157 10*3/uL (ref 150–400)
RBC: 2.87 MIL/uL — ABNORMAL LOW (ref 3.87–5.11)
RDW: 14.2 % (ref 11.5–15.5)
WBC: 9.7 10*3/uL (ref 4.0–10.5)

## 2018-08-05 LAB — BASIC METABOLIC PANEL
Anion gap: 6 (ref 5–15)
BUN: 50 mg/dL — ABNORMAL HIGH (ref 8–23)
CHLORIDE: 106 mmol/L (ref 98–111)
CO2: 27 mmol/L (ref 22–32)
Calcium: 8.7 mg/dL — ABNORMAL LOW (ref 8.9–10.3)
Creatinine, Ser: 1.57 mg/dL — ABNORMAL HIGH (ref 0.44–1.00)
GFR calc Af Amer: 36 mL/min — ABNORMAL LOW (ref >60)
GFR calc non Af Amer: 31 mL/min — ABNORMAL LOW (ref >60)
Glucose, Bld: 91 mg/dL (ref 70–99)
Potassium: 4.5 mmol/L (ref 3.5–5.1)
Sodium: 139 mmol/L (ref 135–145)

## 2018-08-05 NOTE — Progress Notes (Signed)
[facility] PROGRESS NOTE    Progress Note  [name]  EXB:[id number] DOB: 10/20/1940 DOA: 08/04/2018 PCP: Laurie Lima, MD     Brief Narrative:   [name] is an 78 y.o. female past medical history of diastolic heart failure, COPD on home oxygen, obstructive sleep apnea not on CPAP essential hypertension ulcerative colitis chronic kidney disease stage III with a baseline creatinine of 1.3 no GI AVMs chronic anemia as well as A. fib not on anticoagulation due to GI bleed presents with hematochezia that started on the night of admission  Assessment/Plan:   Hematochezia suspect due to GI AVM's: EGD and colonoscopy in 2016 showed multiple AVMs in the cecal and duodenal area. Continue to monitor hemoglobin, her hemoglobin has stabilized today 7.8. She is status post 1 unit of packed red blood cells.  Cont. to trend hemoglobins. Continue clear liq diet. Continue to monitor for episodes of hematochezia will transfuse if hemoglobin drops below 7.  Acute on chronic blood loss:  With a baseline hemoglobin around 10 likely due to above. She status post 1 unit of packed red blood cells see for further details.  COPD: Continue home oxygen. Stable.  Chronic diastolic heart failure: Continue current regimen she seems to be euvolemic.  Gout: Continue allopurinol.  Sleep apnea: She has not tolerated in the past.  Chronic kidney disease stage III: Baseline creatinine 1.2-1.4.   Creatinine is close to baseline we will continue to monitor intermittently.  Chronic atrial fibrillation: Rate controlled on current medications from home, not a candidate for anticoagulation due to GI bleed.  Chronic combined systolic and diastolic heart failure: She seems to be euvolemic continue current home regimen.  Essential hypertension: No changes made to her medication continue current regimen.  OSA (obstructive sleep apnea)  DVT prophylaxis: SCD's Family  Communication:none Disposition Plan/Barrier to D/C: once GI resolved Code Status:     Code Status Orders  (From admission, onward)         Start     Ordered   08/04/18 1523  Do not attempt resuscitation (DNR)  Continuous    Question Answer Comment  In the event of cardiac or respiratory ARREST Do not call a "code blue"   In the event of cardiac or respiratory ARREST Do not perform Intubation, CPR, defibrillation or ACLS   In the event of cardiac or respiratory ARREST Use medication by any route, position, wound care, and other measures to relive pain and suffering. May use oxygen, suction and manual treatment of airway obstruction as needed for comfort.      08/04/18 1522        Code Status History    Date Active Date Inactive Code Status Order ID Comments User Context   10/17/2017 2309 10/20/2017 2047 DNR [id number]  Laurie Patience, MD ED   08/09/2017 0040 08/15/2017 1555 DNR [id number]  Laurie Mocha, MD ED   11/08/2016 2026 11/11/2016 2001 Partial Code [id number]  Laurie Bulls, MD ED   03/19/2016 2109 03/22/2016 2157 Partial Code [id number]  Laurie Baker, MD Inpatient   11/16/2014 1524 11/23/2014 1638 DNR [id number]  Laurie Parr, NP Inpatient   06/01/2013 2001 06/05/2013 1502 Full Code [id number]  Laurie Jewett, MD ED        IV Access:    Peripheral IV   Procedures and diagnostic studies:   No results found.   Medical Consultants:    None.  Anti-Infectives:     Subjective:    Laurie Latino  J [name] no further episodes of hematochezia. She is tolerating her diet she denies any abdominal pain.  Objective:    Vitals:   08/04/18 2126 08/04/18 2256 08/05/18 0542 08/05/18 0749  BP: (!) 113/40 (!) 139/46 (!) 154/44   Pulse: (!) 59 (!) 56 (!) 58   Resp: 13 20 14    Temp: 98.5 F (36.9 C) 98.6 F (37 C) 98.1 F (36.7 C)   TempSrc: Oral Oral Oral   SpO2: 100% 100% 100% 99%  Weight:      Height:        Intake/Output Summary (Last 24 hours) at  08/05/2018 0854 Last data filed at 08/04/2018 2300 Gross per 24 hour  Intake 1449.54 ml  Output -  Net 1449.54 ml   Filed Weights   08/04/18 1615  Weight: 94.1 kg    Exam: General exam: In no acute distress. Respiratory system: Good air movement and clear to auscultation. Cardiovascular system: Regular rate and rhythm with a positive S1-S2 no JVD. Gastrointestinal system: Positive bowel sounds soft nontender nondistended. Central nervous system: Awake alert and oriented x3 nonfocal. Extremities: No pedal edema. Skin: No rashes, lesions or ulcers Psychiatry: Judgement and insight appear normal. Mood & affect appropriate.    Data Reviewed:    Labs: Basic Metabolic Panel: Recent Labs  Lab 08/04/18 1154 08/05/18 0210  NA 138 139  K 4.8 4.5  CL 102 106  CO2 28 27  GLUCOSE 133* 91  BUN 49* 50*  CREATININE 1.60* 1.57*  CALCIUM 8.8* 8.7*   GFR Estimated Creatinine Clearance: 30.3 mL/min (A) (by C-G formula based on SCr of 1.57 mg/dL (H)). Liver Function Tests: Recent Labs  Lab 08/04/18 1154  AST 16  ALT 10  ALKPHOS 52  BILITOT 0.4  PROT 6.4*  ALBUMIN 3.4*   No results for input(s): LIPASE, AMYLASE in the last 168 hours. No results for input(s): AMMONIA in the last 168 hours. Coagulation profile No results for input(s): INR, PROTIME in the last 168 hours.  CBC: Recent Labs  Lab 08/04/18 1154 08/04/18 1411 08/04/18 1910 08/05/18 0210 08/05/18 0831  WBC 11.1*  --   --  9.7  --   HGB 7.7* 6.9* 6.9* 7.6* 7.8*  HCT 25.1*  --   --  24.8*  --   MCV 87.2  --   --  86.4  --   PLT 198  --   --  157  --    Cardiac Enzymes: No results for input(s): CKTOTAL, CKMB, CKMBINDEX, TROPONINI in the last 168 hours. BNP (last 3 results) Recent Labs    08/08/17 1622 01/01/18 0907  PROBNP 304.0* 145.0*   CBG: No results for input(s): GLUCAP in the last 168 hours. D-Dimer: No results for input(s): DDIMER in the last 72 hours. Hgb A1c: No results for input(s): HGBA1C  in the last 72 hours. Lipid Profile: No results for input(s): CHOL, HDL, LDLCALC, TRIG, CHOLHDL, LDLDIRECT in the last 72 hours. Thyroid function studies: No results for input(s): TSH, T4TOTAL, T3FREE, THYROIDAB in the last 72 hours.  Invalid input(s): FREET3 Anemia work up: No results for input(s): VITAMINB12, FOLATE, FERRITIN, TIBC, IRON, RETICCTPCT in the last 72 hours. Sepsis Labs: Recent Labs  Lab 08/04/18 1154 08/05/18 0210  WBC 11.1* 9.7   Microbiology No results found for this or any previous visit (from the past 240 hour(s)).   Medications:   . sodium chloride   Intravenous Once  . allopurinol  100 mg Oral Daily  . chlorhexidine  15  mL Mouth Rinse BID  . ferrous sulfate  325 mg Oral BID WC  . fluticasone furoate-vilanterol  1 puff Inhalation Daily  . isosorbide-hydrALAZINE  1 tablet Oral TID  . mouth rinse  15 mL Mouth Rinse q12n4p  . metoprolol tartrate  12.5 mg Oral BID  . torsemide  40 mg Oral Daily   Continuous Infusions:    LOS: 0 days   Charlynne Cousins  [facility]   *Please refer to [email or web address], password [email or web address] to get updated schedule on who will round on this patient, as hospitalists switch teams weekly. If 7PM-7AM, please contact night-coverage at [email or web address], password [email or web address] for any overnight needs.  08/05/2018, 8:54 AM

## 2018-08-05 NOTE — Care Management Obs Status (Signed)
[facility] NOTIFICATION   Patient Details  Name: [name] MRN: [id number] Date of Birth: 1941/03/09   Medicare Observation Status Notification Given:  Yes    Lynnell Catalan, RN 08/05/2018, 2:10 PM

## 2018-08-05 NOTE — Progress Notes (Addendum)
[name]   I have taken an interval history, reviewed the chart and examined the patient. I agree with the Advanced Practitioner's note, impression and recommendations.   Medically sensible to pursue a colonoscopy and possibly an EGD and to find and ablate AVM's, look for other lesions.  [name] remains very ambivalent if not opposed to this.  [name] has stopped bleeding it seems.  Will see her again tomorrow - one path may be to dc - observe and let her f/u Dr. Fuller Plan to discuss options if [name] wants.  Gatha Mayer, MD, [facility] 08/05/2018 5:08 PM Pager [phone]   Progress Note   Subjective  Chief Complaint: Hematochezia, anemia  This morning, patient tells me that [name] is having some continued abdominal pain bilateral lower quadrants as well as periumbilically.  [name] tolerated clear liquids overnight.  No further bleeding per her.   Objective   Vital signs in last 24 hours: Temp:  [97.8 F (36.6 C)-98.6 F (37 C)] 98.1 F (36.7 C) (01/07 0542) Pulse Rate:  [56-68] 58 (01/07 0542) Resp:  [13-21] 14 (01/07 0542) BP: (107-157)/(37-52) 154/44 (01/07 0542) SpO2:  [99 %-100 %] 99 % (01/07 0749) Weight:  [94.1 kg] 94.1 kg (01/06 1615) Last BM Date: 08/04/18 General:   Obese, ill appearing white female in NAD Heart:  Regular rate and rhythm; no murmurs Lungs: Respirations even and unlabored, lungs CTA bilaterally +O2 via  Abdomen:  Soft, mild generelized ttp and nondistended. Normal bowel sounds. Extremities:  Without edema. Neurologic:  Alert and oriented,  grossly normal neurologically. Psych:  Cooperative. Normal mood and affect.  Intake/Output from previous day: 01/06 0701 - 01/07 0700 In: 1449.5 [P.O.:120; I.V.:30; Blood:299.5; IV Piggyback:1000] Out: -  Intake/Output this shift: Total I/O In: 1200 [P.O.:1200] Out: -   Lab Results: Recent Labs    08/04/18 1154  08/04/18 1910 08/05/18 0210 08/05/18 0831  WBC 11.1*  --   --   9.7  --   HGB 7.7*   < > 6.9* 7.6* 7.8*  HCT 25.1*  --   --  24.8*  --   PLT 198  --   --  157  --    < > = values in this interval not displayed.   BMET Recent Labs    08/04/18 1154 08/05/18 0210  NA 138 139  K 4.8 4.5  CL 102 106  CO2 28 27  GLUCOSE 133* 91  BUN 49* 50*  CREATININE 1.60* 1.57*  CALCIUM 8.8* 8.7*   LFT Recent Labs    08/04/18 1154  PROT 6.4*  ALBUMIN 3.4*  AST 16  ALT 10  ALKPHOS 52  BILITOT 0.4    Assessment / Plan:   Assessment: 1.  Hematochezia: 7 episodes, the last around 10 or 11 AM 08/04/2018, none since then, hemoglobin 6.9-->1 u prbcs-->7.8, known duodenal and cecal AVMs per endoscopic evaluation in 2016; again likely recurrent AVM bleed 2.  Lower abdominal pain: Pain is actually spread periumbilically today as well; uncertain etiology 3.  COPD: On 3 L oxygen chronically 4. Vague H/o UC: on Colon in 2009- never on maintenance meds, repeat in 2016 with no signs of inflammation  Plan: 1.  Again we will continue to monitor hemoglobin with transfusion less than 7 2.  Will discuss plan for enteroscopy with Dr. Carlean Purl today.  We are trying to wait on this due to patient's multiple co morbidities and it does not appear [name] is acutely bleeding at this  time. 3.  Patient may continue clear liquid diet today. 4.  Please awaiting further recommendations with Dr. Carlean Purl later today.   Thank you for your kind consultation, we will continue to follow.    LOS: 0 days   Levin Erp  08/05/2018, 11:54 AM

## 2018-08-05 NOTE — Plan of Care (Signed)
  Problem: Elimination: Goal: Will not experience complications related to bowel motility Outcome: Progressing   Problem: Safety: Goal: Ability to remain free from injury will improve Outcome: Progressing   

## 2018-08-06 DIAGNOSIS — K921 Melena: Secondary | ICD-10-CM | POA: Diagnosis not present

## 2018-08-06 DIAGNOSIS — D500 Iron deficiency anemia secondary to blood loss (chronic): Secondary | ICD-10-CM | POA: Diagnosis not present

## 2018-08-06 LAB — CBC
HCT: 25.4 % — ABNORMAL LOW (ref 36.0–46.0)
Hemoglobin: 8.1 g/dL — ABNORMAL LOW (ref 12.0–15.0)
MCH: 25.9 pg — ABNORMAL LOW (ref 26.0–34.0)
MCHC: 31.9 g/dL (ref 30.0–36.0)
MCV: 81.2 fL (ref 80.0–100.0)
Platelets: 132 10*3/uL — ABNORMAL LOW (ref 150–400)
RBC: 3.13 MIL/uL — ABNORMAL LOW (ref 3.87–5.11)
RDW: 14.6 % (ref 11.5–15.5)
WBC: 7.6 10*3/uL (ref 4.0–10.5)
nRBC: 0 % (ref 0.0–0.2)

## 2018-08-06 LAB — BASIC METABOLIC PANEL
Anion gap: 7 (ref 5–15)
BUN: 38 mg/dL — ABNORMAL HIGH (ref 8–23)
CO2: 26 mmol/L (ref 22–32)
Calcium: 9.2 mg/dL (ref 8.9–10.3)
Chloride: 107 mmol/L (ref 98–111)
Creatinine, Ser: 1.44 mg/dL — ABNORMAL HIGH (ref 0.44–1.00)
GFR calc Af Amer: 40 mL/min — ABNORMAL LOW (ref >60)
GFR calc non Af Amer: 35 mL/min — ABNORMAL LOW (ref >60)
Glucose, Bld: 93 mg/dL (ref 70–99)
Potassium: 5 mmol/L (ref 3.5–5.1)
Sodium: 140 mmol/L (ref 135–145)

## 2018-08-06 MED ORDER — BISACODYL 5 MG PO TBEC: 20.0000 mg | DELAYED_RELEASE_TABLET | Freq: Once | ORAL | Status: DC

## 2018-08-06 MED ORDER — PEG-KCL-NACL-NASULF-NA ASC-C 100 G PO SOLR
0.5000 | ORAL | Status: DC
  Filled 2018-08-06: qty 1

## 2018-08-06 NOTE — Progress Notes (Signed)
     Hgb stable She is willing to do endoscopic evaluation now I cannot schedule it tomorrow - will do colonoscopy/egd Friday  She can have some soild food today but back to clears tomorrow  Gatha Mayer, MD, [facility] 08/06/2018 3:09 PM Pager [phone]

## 2018-08-06 NOTE — Discharge Summary (Signed)
Discharge Summary  [name] UYQ:[id number] DOB: Dec 26, 1940  PCP: [name], MD  Admit date: 08/04/2018 Discharge date: 08/06/2018  Time spent: 35 minutes  Recommendations for Outpatient Follow-up:  Follow-up with GI; please call for a follow up appointment within 48 hours Follow-up with your PCP Take your medications as prescribed  Discharge Diagnoses:  Active Hospital Problems   Diagnosis Date Noted  . Hematochezia 08/04/2018  . Anemia due to GI blood loss   . Atrial fibrillation ([place]) 10/17/2017  . CHF (congestive heart failure), NYHA class III, chronic, combined ([place]) 08/10/2017  . Angiodysplasia of cecum   . COLD (chronic obstructive lung disease) ([place])   . Anemia, iron deficiency 11/16/2014  . OSA (obstructive sleep apnea) 06/22/2013  . CKD (chronic kidney disease), stage III ([place]) 06/20/2010  . Morbid obesity ([place]) 04/19/2010  . ULCERATIVE COLITIS-LEFT SIDE 03/19/2008  . HTN (hypertension) 07/30/1988    Resolved Hospital Problems  No resolved problems to display.    Discharge Condition: Stable  Diet recommendation: Resume previous diet  Vitals:   08/06/18 0743 08/06/18 1234  BP:  (!) 142/45  Pulse:  (!) 49  Resp:  15  Temp:  97.8 F (36.6 C)  SpO2: 98% 100%    History of present illness:   [name] an 78 y.o.femalepast medical history of diastolic heart failure, COPD on home oxygen, obstructive sleep apnea not on CPAP essential hypertension ulcerative colitis chronic kidney disease stage III with a baseline creatinine of 1.3 no GI AVMs chronic anemia as well as A. fib not on anticoagulation due to GI bleed presents with hematochezia that started on the night of admission.  08/06/2018: Patient seen and examined at her bedside.  Denies any recurrent hematochezia or melena.  States she has not had a bowel movement since admission.  GI following awaiting decision on further evaluation.  GI recommended possible colonoscopy/EGD on Friday,  August 08, 2018.  Patient will need to follow-up with GI outpatient for further work-up.  On the day of discharge, the patient was hemodynamically stable.  No recurrent bleeding, hematochezia or melena.  Hospital Course:  Principal Problem:   Hematochezia Active Problems:   Morbid obesity ([place])   HTN (hypertension)   ULCERATIVE COLITIS-LEFT SIDE   CKD (chronic kidney disease), stage III ([facility])   OSA (obstructive sleep apnea)   Anemia, iron deficiency   COLD (chronic obstructive lung disease) ([facility])   Angiodysplasia of cecum   CHF (congestive heart failure), NYHA class III, chronic, combined ([place])   Atrial fibrillation ([place])   Anemia due to GI blood loss  Resolved hematocheziasuspect due to GI AVM's: EGD and colonoscopy in 2016 showed multiple AVMs in the cecal and duodenal area. Continue to monitor hemoglobin, her hemoglobin has stabilized today 7.8. She is status post 1 unit of packed red blood cells. Hemoglobin 8.1 from 8.6 yesterday No recurrence of hematochezia or melena No bowel movement since admission Follow-up with GI outpatient for possible colonoscopy/EGD on Friday, August 08, 2018.  Acute on chronic blood loss:  With a baseline hemoglobin around 10 likely due to above. She status post 1 unit of packed red blood cells see for further details.  Improving AKI on CKD 3 Baseline creatinine appears to be 1.3 with GFR 40 Presented with creatinine of 1.6 Avoid nephrotoxic agents/dehydration/hypotension Follow-up with your PCP  COPD: Continue home oxygen. On 2 L of oxygen by nasal cannula continuously Stable.  Chronic diastolic heart failure: Continue current regimen Appears euvolemic.  Gout: Continue allopurinol.  Obstructive sleep apnea: She has not tolerated CPAP in the past.  Chronic atrial fibrillation: Rate controlled on current medications from home, not a candidate for anticoagulation due to AVM GI bleed.  Essential hypertension: Blood  pressure stable No changes made to her medication continue current regimen.   DVT prophylaxis:SCD's   Code Status: DNR     Discharge Exam: BP (!) 142/45 (BP Location: Right Arm)   Pulse (!) 49   Temp 97.8 F (36.6 C) (Oral)   Resp 15   Ht 5' (1.524 m)   Wt 94.1 kg   SpO2 100%   BMI 40.52 kg/m  . General: 78 y.o. year-old female well developed well nourished in no acute distress.  Alert and oriented x3. . Cardiovascular: Regular rate and rhythm with no rubs or gallops.  No thyromegaly or JVD noted.   Marland Kitchen Respiratory: Clear to auscultation with no wheezes or rales. Good inspiratory effort. . Abdomen: Soft nontender nondistended with normal bowel sounds x4 quadrants. . Musculoskeletal: No lower extremity edema. 2/4 pulses in all 4 extremities. . Skin: No ulcerative lesions noted or rashes, . Psychiatry: Mood is appropriate for condition and setting  Discharge Instructions You were cared for by a hospitalist during your hospital stay. If you have any questions about your discharge medications or the care you received while you were in the hospital after you are discharged, you can call the unit and asked to speak with the hospitalist on call if the hospitalist that took care of you is not available. Once you are discharged, your primary care physician will handle any further medical issues. Please note that NO REFILLS for any discharge medications will be authorized once you are discharged, as it is imperative that you return to your primary care physician (or establish a relationship with a primary care physician if you do not have one) for your aftercare needs so that they can reassess your need for medications and monitor your lab values.   Allergies as of 08/06/2018      Reactions   Amoxicillin Diarrhea   DID THE REACTION INVOLVE: Swelling of the face/tongue/throat, SOB, or low BP? N Sudden or severe rash/hives, skin peeling, or the inside of the mouth or nose? N Did it  require medical treatment? N When did it last happen?MORE THAN 10 YEARS If all above answers are "NO", may proceed with cephalosporin use.   Celebrex [celecoxib]    edema   Enalapril Cough   Lipitor [atorvastatin]    Muscle aches   Amlodipine Besylate    REACTION: edema   Codeine Sulfate    REACTION: Nausea   Hydrocodone-acetaminophen    REACTION: Nausea      Medication List    TAKE these medications   acetaminophen 325 MG tablet Commonly known as:  TYLENOL Take 325-650 mg by mouth daily as needed for moderate pain, fever or headache.   albuterol 108 (90 Base) MCG/ACT inhaler Commonly known as:  PROAIR HFA Inhale 1-2 puffs into the lungs every 6 (six) hours as needed for wheezing or shortness of breath.   allopurinol 100 MG tablet Commonly known as:  ZYLOPRIM TAKE 1 TABLET BY MOUTH EVERY DAY FOR GOUT What changed:  See the new instructions.   BIDIL 20-37.5 MG tablet Generic drug:  isosorbide-hydrALAZINE TAKE 1 TABLET BY MOUTH THREE TIMES DAILY   cyanocobalamin 2000 MCG tablet Take 1 tablet (2,000 mcg total) by mouth daily. What changed:  when to take this   ferrous sulfate 325 (65 FE)  MG tablet Take 1 tablet (325 mg total) by mouth 2 (two) times daily with a meal.   fluticasone furoate-vilanterol 100-25 MCG/INH Aepb Commonly known as:  BREO ELLIPTA Inhale 1 puff into the lungs daily.   metoprolol tartrate 25 MG tablet Commonly known as:  LOPRESSOR Take 0.5 tablets (12.5 mg total) by mouth 2 (two) times daily.   torsemide 20 MG tablet Commonly known as:  DEMADEX TAKE 1 TABLET(20 MG) BY MOUTH TWICE DAILY What changed:  See the new instructions.   VASCULERA Tabs TAKE ONE TABLET BY MOUTH EVERY DAY   Vitamin D (Ergocalciferol) 1.25 MG (50000 UT) Caps capsule Commonly known as:  DRISDOL TAKE 50,000 CAPSULE BY MOUTH ONCE WEEKLY      Allergies  Allergen Reactions  . Amoxicillin Diarrhea    DID THE REACTION INVOLVE: Swelling of the face/tongue/throat,  SOB, or low BP? N Sudden or severe rash/hives, skin peeling, or the inside of the mouth or nose? N Did it require medical treatment? N When did it last happen?MORE THAN 10 YEARS If all above answers are "NO", may proceed with cephalosporin use.   . Celebrex [Celecoxib]     edema  . Enalapril Cough  . Lipitor [Atorvastatin]     Muscle aches  . Amlodipine Besylate     REACTION: edema  . Codeine Sulfate     REACTION: Nausea  . Hydrocodone-Acetaminophen     REACTION: Nausea   Follow-up Information    [name], MD. Call in 1 day(s).   Specialty:  Internal Medicine Why:  Please call for a post hospital follow-up appointment. Contact information: [address] [address] [phone]        Jettie Booze, MD .   Specialties:  Cardiology, Radiology, Interventional Cardiology Contact information: [address] [address] [phone]        Gatha Mayer, MD. Call in 1 day(s).   Specialty:  Gastroenterology Why:  please call within 48 hours for a post hospital follow up appointment. Contact information: [address] [address] [phone]            The results of significant diagnostics from this hospitalization (including imaging, microbiology, ancillary and laboratory) are listed below for reference.    Significant Diagnostic Studies: No results found.  Microbiology: No results found for this or any previous visit (from the past 240 hour(s)).   Labs: Basic Metabolic Panel: Recent Labs  Lab 08/04/18 1154 08/05/18 0210 08/06/18 0711  NA 138 139 140  K 4.8 4.5 5.0  CL 102 106 107  CO2 28 27 26   GLUCOSE 133* 91 93  BUN 49* 50* 38*  CREATININE 1.60* 1.57* 1.44*  CALCIUM 8.8* 8.7* 9.2   Liver Function Tests: Recent Labs  Lab 08/04/18 1154  AST 16  ALT 10  ALKPHOS 52  BILITOT 0.4  PROT 6.4*  ALBUMIN 3.4*   No results for input(s): LIPASE, AMYLASE in  the last 168 hours. No results for input(s): AMMONIA in the last 168 hours. CBC: Recent Labs  Lab 08/04/18 1154  08/04/18 1910 08/05/18 0210 08/05/18 0831 08/05/18 1515 08/06/18 0711  WBC 11.1*  --   --  9.7  --   --  7.6  HGB 7.7*   < > 6.9* 7.6* 7.8* 8.6* 8.1*  HCT 25.1*  --   --  24.8*  --   --  25.4*  MCV 87.2  --   --  86.4  --   --  81.2  PLT 198  --   --  157  --   --  132*   < > = values in this interval not displayed.   Cardiac Enzymes: No results for input(s): CKTOTAL, CKMB, CKMBINDEX, TROPONINI in the last 168 hours. BNP: BNP (last 3 results) Recent Labs    08/08/17 2051 10/17/17 2046  BNP 268.3* 324.4*    ProBNP (last 3 results) Recent Labs    08/08/17 1622 01/01/18 0907  PROBNP 304.0* 145.0*    CBG: No results for input(s): GLUCAP in the last 168 hours.     Signed:  Kayleen Memos, MD [facility] 08/06/2018, 5:38 PM

## 2018-08-06 NOTE — Evaluation (Signed)
Physical Therapy Evaluation Patient Details Name: [name] MRN: [id number] DOB: 16-Jun-1941 Today's Date: 08/06/2018   History of Present Illness  78 yo female admitted to ED on 1/6 from home presenting with hematochezia, suspect due to GI bleed/AVMs. PMH includes angiodysplasia/AVMs of cecum and duodenum 2016, eczema, afib, CHF, chronic venous dermatitis of bilateral LEs, COPD on home O2, cor pulmonale, CHF, GERD, gout, HTN, HLD, obesity, OSA, osteopenia, pre-DM.   Clinical Impression   Pt presents with abdominal discomfort, LE weakness, poor dynamic standing balance, and decreased tolerance for ambulation due to fatigue. Pt to benefit from acute PT to address deficits. Pt ambulated 40 ft with RW with min guard to min assist for steadying, pt required standing rest break due to fatigue. PT recommending HHPT to address mobility deficits once d/c from acute setting, but pt refuses this recommendation. Pt reports having a negative experience with her HHPT and HHOT last time she had the services. PT to progress mobility as tolerated, and will continue to follow acutely.      Follow Up Recommendations Home health PT;Supervision for mobility/OOB    Equipment Recommendations  None recommended by PT    Recommendations for Other Services       Precautions / Restrictions Precautions Precautions: Fall Precaution Comments: Pt on 2LO2 at baseline Restrictions Weight Bearing Restrictions: No      Mobility  Bed Mobility Overal bed mobility: Needs Assistance             General bed mobility comments: Pt up in chair upon arrival to room.   Transfers Overall transfer level: Needs assistance Equipment used: Rolling walker (2 wheeled) Transfers: Sit to/from Stand Sit to Stand: Min assist         General transfer comment: Min assist for steadying and slow, controlled descent. Pt with unsteadiness upon standing, able to self-steady with RW.   Ambulation/Gait Ambulation/Gait  assistance: Min guard;Min assist Gait Distance (Feet): 85 Feet Assistive device: Rolling walker (2 wheeled) Gait Pattern/deviations: Step-through pattern;Decreased stride length Gait velocity: decr   General Gait Details: Min guard for safety for majority of ambulation, occasional min assist for steadying pt has she has the tendency to lose her balance with stopping/starting and turning. Pt reaching for environment when stopped to check sats, and to take standing rest break due to fatigue. standing rest break x1, SpO2 and HR 99% and 76 bpm, respectively.   Stairs            Wheelchair Mobility    Modified Rankin (Stroke Patients Only)       Balance Overall balance assessment: Needs assistance;History of Falls Sitting-balance support: No upper extremity supported Sitting balance-Leahy Scale: Fair     Standing balance support: Bilateral upper extremity supported Standing balance-Leahy Scale: Poor Standing balance comment: relies on RW for steadying, unsteadiness noted and pt reaching for environment with starting/stopping ambulation                             Pertinent Vitals/Pain Pain Assessment: 0-10 Pain Score: 4  Pain Location: abdomen Pain Descriptors / Indicators: Discomfort Pain Intervention(s): Limited activity within patient's tolerance;Repositioned;Monitored during session    Home Living Family/patient expects to be discharged to:: Private residence Living Arrangements: Spouse/significant other;Children Available Help at Discharge: Family;Available PRN/intermittently(oldest daughter lives with them, has mental disability and pt generally takes care of her) Type of Home: House Home Access: Stairs to enter Entrance Stairs-Rails: None Entrance Stairs-Number of Steps: 1 Home  Layout: One level Home Equipment: Cane - single point;Walker - 2 wheels;Bedside commode;Grab bars - tub/shower      Prior Function Level of Independence: Independent with  assistive device(s)         Comments: using cane for mobility prior to admission. Pt reports mod I with all ADLs/iADLs, and that husband does not help much with home care. Pt's daughter assists with getting mail, washing dishes, etc.      Hand Dominance   Dominant Hand: Right    Extremity/Trunk Assessment   Upper Extremity Assessment Upper Extremity Assessment: Generalized weakness    Lower Extremity Assessment Lower Extremity Assessment: Generalized weakness;RLE deficits/detail;LLE deficits/detail RLE Deficits / Details: MMT: hip flexion 3-/5, hip abduction/adduction 3/5, knee flexion 3+/5, knee extension 3-/5, DF/PF WFL  RLE Sensation: WNL LLE Deficits / Details: MMT: hip flexion 3-/5, hip abduction/adduction 3/5, knee flexion 3+/5, knee extension 3-/5, DF/PF WFL. Pt with dry/cracked skin on L lower leg, some sloughing off.  LLE Sensation: WNL    Cervical / Trunk Assessment Cervical / Trunk Assessment: Normal  Communication   Communication: No difficulties  Cognition Arousal/Alertness: Awake/alert Behavior During Therapy: WFL for tasks assessed/performed Overall Cognitive Status: Within Functional Limits for tasks assessed                                        General Comments General comments (skin integrity, edema, etc.): Pt with bleeding from nose noted. Pt states this is baseline and that she has nosebleeds daily. RN notified.     Exercises General Exercises - Lower Extremity Long Arc Quad: AROM;Both;10 reps;Seated Hip Flexion/Marching: AROM;Both;10 reps;Seated Mini-Sqauts: AROM;Both;5 reps;Seated;Standing(Sit to stands from recliner. Assist for slow lowering, pt with fatigue after 5 STS)   Assessment/Plan    PT Assessment Patient needs continued PT services  PT Problem List Decreased strength;Pain;Decreased activity tolerance;Decreased knowledge of use of DME;Decreased balance;Decreased safety awareness;Decreased mobility;Obesity       PT  Treatment Interventions DME instruction;Therapeutic activities;Gait training;Therapeutic exercise;Patient/family education;Stair training;Balance training;Functional mobility training    PT Goals (Current goals can be found in the Care Plan section)  Acute Rehab PT Goals Patient Stated Goal: go home  PT Goal Formulation: With patient Time For Goal Achievement: 08/20/18 Potential to Achieve Goals: Good    Frequency Min 3X/week   Barriers to discharge        Co-evaluation               AM-PAC PT "6 Clicks" Mobility  Outcome Measure Help needed turning from your back to your side while in a flat bed without using bedrails?: A Little Help needed moving from lying on your back to sitting on the side of a flat bed without using bedrails?: A Little Help needed moving to and from a bed to a chair (including a wheelchair)?: A Little Help needed standing up from a chair using your arms (e.g., wheelchair or bedside chair)?: A Little Help needed to walk in hospital room?: A Little Help needed climbing 3-5 steps with a railing? : A Little 6 Click Score: 18    End of Session Equipment Utilized During Treatment: Gait belt;Oxygen Activity Tolerance: Patient limited by fatigue;Patient tolerated treatment well Patient left: in chair;with chair alarm set;with call bell/phone within reach;with family/visitor present Nurse Communication: Mobility status;Other (comment)(Pt with nosebleed) PT Visit Diagnosis: Difficulty in walking, not elsewhere classified (R26.2);Unsteadiness on feet (R26.81)    Time: 6144-3154  PT Time Calculation (min) (ACUTE ONLY): 22 min   Charges:   PT Evaluation $PT Eval Low Complexity: 1 Low          Rishikesh Khachatryan Conception Chancy, PT Acute Rehabilitation Services Pager [phone]  Office [phone]   Valia Wingard D Christos Mixson 08/06/2018, 1:10 PM

## 2018-08-06 NOTE — Progress Notes (Signed)
PROGRESS NOTE  [name] ZDG:[id number] DOB: 02-Apr-1941 DOA: 08/04/2018 PCP: [name], MD  HPI/Recap of past 24 hours: [name] is an 78 y.o. female past medical history of diastolic heart failure, COPD on home oxygen, obstructive sleep apnea not on CPAP essential hypertension ulcerative colitis chronic kidney disease stage III with a baseline creatinine of 1.3 no GI AVMs chronic anemia as well as A. fib not on anticoagulation due to GI bleed presents with hematochezia that started on the night of admission.  08/06/2018: Patient seen and examined at her bedside.  Denies any recurrent hematochezia or melena.  States she has not had a bowel movement since admission.  GI following awaiting decision on further evaluation.  Assessment/Plan: Principal Problem:   Hematochezia Active Problems:   Morbid obesity ([place])   HTN (hypertension)   ULCERATIVE COLITIS-LEFT SIDE   CKD (chronic kidney disease), stage III (HCC)   OSA (obstructive sleep apnea)   Anemia, iron deficiency   COLD (chronic obstructive lung disease) (HCC)   Angiodysplasia of cecum   CHF (congestive heart failure), NYHA class III, chronic, combined ([place])   Atrial fibrillation ([place])   Anemia due to GI blood loss  Resolved hematochezia suspect due to GI AVM's: EGD and colonoscopy in 2016 showed multiple AVMs in the cecal and duodenal area. Continue to monitor hemoglobin, her hemoglobin has stabilized today 7.8. She is status post 1 unit of packed red blood cells.  Hemoglobin 8.1 from 8.6 yesterday No recurrence of hematochezia or melena No bowel movement since admission GI following, awaiting decision on further evaluation Continue clear liquid diet  Acute on chronic blood loss:  With a baseline hemoglobin around 10 likely due to above. She status post 1 unit of packed red blood cells see for further details.  AKI on CKD 3 Baseline creatinine appears to be 1.3 with GFR 40 Presented with creatinine of  1.6 Avoid nephrotoxic agents/dehydration/hypotension Monitor urine output Repeat BMP in the morning  COPD: Continue home oxygen. On 2 L of oxygen by nasal cannula continuously Stable.  Chronic diastolic heart failure: Continue current regimen Appears euvolemic.  Gout: Continue allopurinol.  Obstructive sleep apnea: She has not tolerated CPAP in the past.  Chronic atrial fibrillation: Rate controlled on current medications from home, not a candidate for anticoagulation due to AVM GI bleed.  Essential hypertension: Blood pressure stable No changes made to her medication continue current regimen.   DVT prophylaxis: SCD's Family Communication:none Disposition Plan/Barrier to D/C:  Possible DC tomorrow 08/07/2018 or when GI signs off.    Code Status:  DNR                    Objective: Vitals:   08/06/18 0529 08/06/18 0628 08/06/18 0740 08/06/18 0743  BP: (!) 170/49 (!) 162/60    Pulse: (!) 57     Resp: 14     Temp: 98 F (36.7 C)     TempSrc: Oral     SpO2: 100%  98% 98%  Weight:      Height:        Intake/Output Summary (Last 24 hours) at 08/06/2018 1157 Last data filed at 08/06/2018 0900 Gross per 24 hour  Intake 1320 ml  Output -  Net 1320 ml   Filed Weights   08/04/18 1615  Weight: 94.1 kg    Exam:  . General: 78 y.o. year-old female well developed well nourished in no acute distress.  Alert and oriented x3. . Cardiovascular: Regular  rate and rhythm with no rubs or gallops.  No thyromegaly or JVD noted.   Marland Kitchen Respiratory: Mild rales at bases.  No wheezes.  Good inspiratory effort. . Abdomen: Soft nontender nondistended with normal bowel sounds x4 quadrants. . Musculoskeletal: No lower extremity edema. 2/4 pulses in all 4 extremities. Marland Kitchen Psychiatry: Mood is appropriate for condition and setting   Data Reviewed: CBC: Recent Labs  Lab 08/04/18 1154  08/04/18 1910 08/05/18 0210 08/05/18 0831 08/05/18 1515 08/06/18 0711  WBC 11.1*  --    --  9.7  --   --  7.6  HGB 7.7*   < > 6.9* 7.6* 7.8* 8.6* 8.1*  HCT 25.1*  --   --  24.8*  --   --  25.4*  MCV 87.2  --   --  86.4  --   --  81.2  PLT 198  --   --  157  --   --  132*   < > = values in this interval not displayed.   Basic Metabolic Panel: Recent Labs  Lab 08/04/18 1154 08/05/18 0210 08/06/18 0711  NA 138 139 140  K 4.8 4.5 5.0  CL 102 106 107  CO2 28 27 26   GLUCOSE 133* 91 93  BUN 49* 50* 38*  CREATININE 1.60* 1.57* 1.44*  CALCIUM 8.8* 8.7* 9.2   GFR: Estimated Creatinine Clearance: 33 mL/min (A) (by C-G formula based on SCr of 1.44 mg/dL (H)). Liver Function Tests: Recent Labs  Lab 08/04/18 1154  AST 16  ALT 10  ALKPHOS 52  BILITOT 0.4  PROT 6.4*  ALBUMIN 3.4*   No results for input(s): LIPASE, AMYLASE in the last 168 hours. No results for input(s): AMMONIA in the last 168 hours. Coagulation Profile: No results for input(s): INR, PROTIME in the last 168 hours. Cardiac Enzymes: No results for input(s): CKTOTAL, CKMB, CKMBINDEX, TROPONINI in the last 168 hours. BNP (last 3 results) Recent Labs    08/08/17 1622 01/01/18 0907  PROBNP 304.0* 145.0*   HbA1C: No results for input(s): HGBA1C in the last 72 hours. CBG: No results for input(s): GLUCAP in the last 168 hours. Lipid Profile: No results for input(s): CHOL, HDL, LDLCALC, TRIG, CHOLHDL, LDLDIRECT in the last 72 hours. Thyroid Function Tests: No results for input(s): TSH, T4TOTAL, FREET4, T3FREE, THYROIDAB in the last 72 hours. Anemia Panel: No results for input(s): VITAMINB12, FOLATE, FERRITIN, TIBC, IRON, RETICCTPCT in the last 72 hours. Urine analysis:    Component Value Date/Time   COLORURINE YELLOW 11/05/2017 1103   APPEARANCEUR CLEAR 11/05/2017 1103   LABSPEC <=1.005 (A) 11/05/2017 1103   PHURINE 6.5 11/05/2017 1103   GLUCOSEU NEGATIVE 11/05/2017 1103   HGBUR NEGATIVE 11/05/2017 1103   HGBUR moderate 02/02/2008 1439   BILIRUBINUR NEGATIVE 11/05/2017 1103   KETONESUR  NEGATIVE 11/05/2017 1103   PROTEINUR 100 (A) 06/01/2013 1722   UROBILINOGEN 0.2 11/05/2017 1103   NITRITE NEGATIVE 11/05/2017 1103   LEUKOCYTESUR NEGATIVE 11/05/2017 1103   Sepsis Labs: @LABRCNTIP (procalcitonin:4,lacticidven:4)  )No results found for this or any previous visit (from the past 240 hour(s)).    Studies: No results found.  Scheduled Meds: . sodium chloride   Intravenous Once  . allopurinol  100 mg Oral Daily  . chlorhexidine  15 mL Mouth Rinse BID  . ferrous sulfate  325 mg Oral BID WC  . fluticasone furoate-vilanterol  1 puff Inhalation Daily  . isosorbide-hydrALAZINE  1 tablet Oral TID  . mouth rinse  15 mL Mouth Rinse q12n4p  .  metoprolol tartrate  12.5 mg Oral BID  . torsemide  40 mg Oral Daily    Continuous Infusions:   LOS: 0 days     Kayleen Memos, MD [facility] Pager [phone]  If 7PM-7AM, please contact night-coverage [email or web address] Password [facility] 08/06/2018, 11:57 AM

## 2018-08-07 ENCOUNTER — Telehealth: Payer: Self-pay | Admitting: Internal Medicine

## 2018-08-07 ENCOUNTER — Telehealth: Payer: Self-pay | Admitting: *Deleted

## 2018-08-07 ENCOUNTER — Other Ambulatory Visit: Payer: Self-pay

## 2018-08-07 NOTE — Telephone Encounter (Signed)
Pt called in and stated that she was told by Dr.Gessner that he will be doing a colon/EGD at the [facility] Friday. She was calling back to get time and to question if she had to get the prep. Pt would like a call

## 2018-08-07 NOTE — Telephone Encounter (Signed)
Patient was to have an inpatient procedure on 08/08/18 with Dr. Carlean Purl.  She was discharged from the hospital last night.  She is advised that the endo/colon has been scheduled at [facility] for 08/12/18 9:15.  She is advised that she will need to arrive at 7:45 am.  She will come for pre-visit 08/08/18 9:00 to get instructions. She verbalized understanding of all

## 2018-08-07 NOTE — Telephone Encounter (Signed)
Pt was on TCM report admitted 08/04/18 for Hematochezia. Pt D/C 08/06/18, and will be following up w/GI 08/08/18 for colonoscopy wDr. Carlean Purl.Marland KitchenJohny Chess

## 2018-08-08 ENCOUNTER — Ambulatory Visit (AMBULATORY_SURGERY_CENTER): Payer: Self-pay

## 2018-08-08 VITALS — Ht 60.0 in | Wt 202.2 lb

## 2018-08-08 DIAGNOSIS — K921 Melena: Secondary | ICD-10-CM

## 2018-08-08 LAB — TYPE AND SCREEN
ABO/RH(D): O POS
Antibody Screen: POSITIVE
DONOR AG TYPE: NEGATIVE
Donor AG Type: NEGATIVE
UNIT DIVISION: 0
Unit division: 0

## 2018-08-08 LAB — BPAM RBC
Blood Product Expiration Date: 202001222359
Blood Product Expiration Date: 202001282359
ISSUE DATE / TIME: 202001062022
Unit Type and Rh: 5100
Unit Type and Rh: 5100

## 2018-08-08 SURGERY — COLONOSCOPY WITH PROPOFOL
Anesthesia: Monitor Anesthesia Care

## 2018-08-08 MED ORDER — NA SULFATE-K SULFATE-MG SULF 17.5-3.13-1.6 GM/177ML PO SOLN: 1.0000 | Freq: Once | ORAL | 0 refills | Status: AC

## 2018-08-08 NOTE — Progress Notes (Signed)
Denies allergies to eggs or soy products. Denies complication of anesthesia or sedation. Denies use of weight loss medication. Denies use of O2.   Emmi instructions declined.  

## 2018-08-11 ENCOUNTER — Encounter (HOSPITAL_COMMUNITY): Payer: Self-pay | Admitting: *Deleted

## 2018-08-12 ENCOUNTER — Observation Stay (HOSPITAL_COMMUNITY): Payer: Medicare Other

## 2018-08-12 ENCOUNTER — Ambulatory Visit (HOSPITAL_COMMUNITY): Payer: Medicare Other | Admitting: Anesthesiology

## 2018-08-12 ENCOUNTER — Ambulatory Visit (HOSPITAL_COMMUNITY): Payer: Medicare Other

## 2018-08-12 ENCOUNTER — Encounter (HOSPITAL_COMMUNITY)
Admission: RE | Disposition: A | Discharge status: Home / Self Care | Payer: Self-pay | Source: Home / Self Care | Attending: Nephrology

## 2018-08-12 ENCOUNTER — Observation Stay (HOSPITAL_COMMUNITY)
Admission: RE | Admit: 2018-08-12 | Discharge: 2018-08-14 | Disposition: A | Discharge status: Home / Self Care | Payer: Medicare Other | Attending: Nephrology | Admitting: Nephrology

## 2018-08-12 ENCOUNTER — Other Ambulatory Visit: Payer: Self-pay

## 2018-08-12 ENCOUNTER — Encounter (HOSPITAL_COMMUNITY): Payer: Self-pay | Admitting: *Deleted

## 2018-08-12 DIAGNOSIS — E559 Vitamin D deficiency, unspecified: Secondary | ICD-10-CM | POA: Insufficient documentation

## 2018-08-12 DIAGNOSIS — N281 Cyst of kidney, acquired: Secondary | ICD-10-CM | POA: Diagnosis not present

## 2018-08-12 DIAGNOSIS — K5520 Angiodysplasia of colon without hemorrhage: Secondary | ICD-10-CM

## 2018-08-12 DIAGNOSIS — K56699 Other intestinal obstruction unspecified as to partial versus complete obstruction: Secondary | ICD-10-CM | POA: Diagnosis not present

## 2018-08-12 DIAGNOSIS — D123 Benign neoplasm of transverse colon: Secondary | ICD-10-CM | POA: Diagnosis not present

## 2018-08-12 DIAGNOSIS — I10 Essential (primary) hypertension: Secondary | ICD-10-CM | POA: Diagnosis present

## 2018-08-12 DIAGNOSIS — E538 Deficiency of other specified B group vitamins: Secondary | ICD-10-CM | POA: Diagnosis not present

## 2018-08-12 DIAGNOSIS — K439 Ventral hernia without obstruction or gangrene: Secondary | ICD-10-CM | POA: Diagnosis not present

## 2018-08-12 DIAGNOSIS — K31819 Angiodysplasia of stomach and duodenum without bleeding: Secondary | ICD-10-CM | POA: Insufficient documentation

## 2018-08-12 DIAGNOSIS — K5190 Ulcerative colitis, unspecified, without complications: Secondary | ICD-10-CM | POA: Insufficient documentation

## 2018-08-12 DIAGNOSIS — K5730 Diverticulosis of large intestine without perforation or abscess without bleeding: Secondary | ICD-10-CM

## 2018-08-12 DIAGNOSIS — I130 Hypertensive heart and chronic kidney disease with heart failure and stage 1 through stage 4 chronic kidney disease, or unspecified chronic kidney disease: Secondary | ICD-10-CM | POA: Insufficient documentation

## 2018-08-12 DIAGNOSIS — G935 Compression of brain: Secondary | ICD-10-CM | POA: Diagnosis present

## 2018-08-12 DIAGNOSIS — Z79899 Other long term (current) drug therapy: Secondary | ICD-10-CM | POA: Insufficient documentation

## 2018-08-12 DIAGNOSIS — J449 Chronic obstructive pulmonary disease, unspecified: Secondary | ICD-10-CM | POA: Diagnosis not present

## 2018-08-12 DIAGNOSIS — R109 Unspecified abdominal pain: Secondary | ICD-10-CM | POA: Diagnosis present

## 2018-08-12 DIAGNOSIS — R1030 Lower abdominal pain, unspecified: Secondary | ICD-10-CM | POA: Diagnosis not present

## 2018-08-12 DIAGNOSIS — K219 Gastro-esophageal reflux disease without esophagitis: Secondary | ICD-10-CM | POA: Diagnosis present

## 2018-08-12 DIAGNOSIS — Z87891 Personal history of nicotine dependence: Secondary | ICD-10-CM | POA: Insufficient documentation

## 2018-08-12 DIAGNOSIS — K921 Melena: Secondary | ICD-10-CM | POA: Diagnosis not present

## 2018-08-12 DIAGNOSIS — I4891 Unspecified atrial fibrillation: Secondary | ICD-10-CM | POA: Diagnosis not present

## 2018-08-12 DIAGNOSIS — M109 Gout, unspecified: Secondary | ICD-10-CM | POA: Insufficient documentation

## 2018-08-12 DIAGNOSIS — N183 Chronic kidney disease, stage 3 (moderate): Secondary | ICD-10-CM | POA: Insufficient documentation

## 2018-08-12 DIAGNOSIS — I5032 Chronic diastolic (congestive) heart failure: Secondary | ICD-10-CM | POA: Diagnosis present

## 2018-08-12 DIAGNOSIS — R7303 Prediabetes: Secondary | ICD-10-CM | POA: Diagnosis present

## 2018-08-12 DIAGNOSIS — R1084 Generalized abdominal pain: Secondary | ICD-10-CM

## 2018-08-12 DIAGNOSIS — I5042 Chronic combined systolic (congestive) and diastolic (congestive) heart failure: Secondary | ICD-10-CM | POA: Insufficient documentation

## 2018-08-12 DIAGNOSIS — K420 Umbilical hernia with obstruction, without gangrene: Secondary | ICD-10-CM | POA: Diagnosis not present

## 2018-08-12 DIAGNOSIS — R52 Pain, unspecified: Secondary | ICD-10-CM

## 2018-08-12 DIAGNOSIS — K5521 Angiodysplasia of colon with hemorrhage: Secondary | ICD-10-CM

## 2018-08-12 DIAGNOSIS — K635 Polyp of colon: Secondary | ICD-10-CM | POA: Insufficient documentation

## 2018-08-12 DIAGNOSIS — I5033 Acute on chronic diastolic (congestive) heart failure: Secondary | ICD-10-CM | POA: Diagnosis present

## 2018-08-12 DIAGNOSIS — Z6839 Body mass index (BMI) 39.0-39.9, adult: Secondary | ICD-10-CM | POA: Insufficient documentation

## 2018-08-12 DIAGNOSIS — Z9981 Dependence on supplemental oxygen: Secondary | ICD-10-CM | POA: Insufficient documentation

## 2018-08-12 DIAGNOSIS — E669 Obesity, unspecified: Secondary | ICD-10-CM | POA: Diagnosis present

## 2018-08-12 DIAGNOSIS — I480 Paroxysmal atrial fibrillation: Secondary | ICD-10-CM | POA: Diagnosis present

## 2018-08-12 DIAGNOSIS — G4733 Obstructive sleep apnea (adult) (pediatric): Secondary | ICD-10-CM | POA: Insufficient documentation

## 2018-08-12 DIAGNOSIS — D500 Iron deficiency anemia secondary to blood loss (chronic): Principal | ICD-10-CM | POA: Diagnosis present

## 2018-08-12 DIAGNOSIS — K5790 Diverticulosis of intestine, part unspecified, without perforation or abscess without bleeding: Secondary | ICD-10-CM | POA: Diagnosis not present

## 2018-08-12 DIAGNOSIS — K5150 Left sided colitis without complications: Secondary | ICD-10-CM | POA: Diagnosis present

## 2018-08-12 DIAGNOSIS — Z7951 Long term (current) use of inhaled steroids: Secondary | ICD-10-CM | POA: Insufficient documentation

## 2018-08-12 DIAGNOSIS — I2781 Cor pulmonale (chronic): Secondary | ICD-10-CM | POA: Insufficient documentation

## 2018-08-12 HISTORY — PX: HOT HEMOSTASIS: SHX5433

## 2018-08-12 HISTORY — PX: COLONOSCOPY WITH PROPOFOL: SHX5780

## 2018-08-12 HISTORY — PX: ESOPHAGOGASTRODUODENOSCOPY (EGD) WITH PROPOFOL: SHX5813

## 2018-08-12 LAB — BASIC METABOLIC PANEL
ANION GAP: 6 (ref 5–15)
BUN: 27 mg/dL — ABNORMAL HIGH (ref 8–23)
CALCIUM: 9 mg/dL (ref 8.9–10.3)
CO2: 28 mmol/L (ref 22–32)
Chloride: 106 mmol/L (ref 98–111)
Creatinine, Ser: 1.23 mg/dL — ABNORMAL HIGH (ref 0.44–1.00)
GFR calc Af Amer: 49 mL/min — ABNORMAL LOW (ref >60)
GFR, EST NON AFRICAN AMERICAN: 42 mL/min — AB (ref >60)
Glucose, Bld: 99 mg/dL (ref 70–99)
POTASSIUM: 3.2 mmol/L — AB (ref 3.5–5.1)
Sodium: 140 mmol/L (ref 135–145)

## 2018-08-12 LAB — CBC WITH DIFFERENTIAL/PLATELET
Abs Immature Granulocytes: 0.04 10*3/uL (ref 0.00–0.07)
BASOS PCT: 0 %
Basophils Absolute: 0 10*3/uL (ref 0.0–0.1)
EOS ABS: 0.2 10*3/uL (ref 0.0–0.5)
Eosinophils Relative: 2 %
HCT: 25.8 % — ABNORMAL LOW (ref 36.0–46.0)
Hemoglobin: 7.9 g/dL — ABNORMAL LOW (ref 12.0–15.0)
Immature Granulocytes: 1 %
Lymphocytes Relative: 15 %
Lymphs Abs: 1.1 10*3/uL (ref 0.7–4.0)
MCH: 26.8 pg (ref 26.0–34.0)
MCHC: 30.6 g/dL (ref 30.0–36.0)
MCV: 87.5 fL (ref 80.0–100.0)
Monocytes Absolute: 0.6 10*3/uL (ref 0.1–1.0)
Monocytes Relative: 8 %
Neutro Abs: 5.7 10*3/uL (ref 1.7–7.7)
Neutrophils Relative %: 74 %
Platelets: 191 10*3/uL (ref 150–400)
RBC: 2.95 MIL/uL — ABNORMAL LOW (ref 3.87–5.11)
RDW: 14.6 % (ref 11.5–15.5)
WBC: 7.7 10*3/uL (ref 4.0–10.5)
nRBC: 0 % (ref 0.0–0.2)

## 2018-08-12 LAB — GLUCOSE, CAPILLARY
Glucose-Capillary: 89 mg/dL (ref 70–99)
Glucose-Capillary: 87 mg/dL (ref 70–99)
Glucose-Capillary: 100 mg/dL — ABNORMAL HIGH (ref 70–99)

## 2018-08-12 LAB — LACTIC ACID, PLASMA
Lactic Acid, Venous: 1 mmol/L (ref 0.5–1.9)
Lactic Acid, Venous: 0.9 mmol/L (ref 0.5–1.9)

## 2018-08-12 SURGERY — COLONOSCOPY WITH PROPOFOL
Anesthesia: Monitor Anesthesia Care

## 2018-08-12 MED ORDER — ALBUTEROL SULFATE (2.5 MG/3ML) 0.083% IN NEBU: 3.0000 mL | INHALATION_SOLUTION | Freq: Four times a day (QID) | RESPIRATORY_TRACT | Status: DC | PRN

## 2018-08-12 MED ORDER — PROPOFOL 10 MG/ML IV BOLUS
INTRAVENOUS | Status: DC | PRN
  Administered 2018-08-12: 20 mg via INTRAVENOUS
  Administered 2018-08-12: 30 mg via INTRAVENOUS
  Administered 2018-08-12 (×2): 10 mg via INTRAVENOUS
  Administered 2018-08-12: 30 mg via INTRAVENOUS
  Administered 2018-08-12: 10 mg via INTRAVENOUS
  Administered 2018-08-12: 20 mg via INTRAVENOUS
  Administered 2018-08-12: 10 mg via INTRAVENOUS
  Administered 2018-08-12: 20 mg via INTRAVENOUS
  Administered 2018-08-12 (×5): 10 mg via INTRAVENOUS
  Administered 2018-08-12: 20 mg via INTRAVENOUS
  Administered 2018-08-12: 10 mg via INTRAVENOUS
  Administered 2018-08-12: 20 mg via INTRAVENOUS
  Administered 2018-08-12 (×3): 10 mg via INTRAVENOUS
  Administered 2018-08-12 (×2): 20 mg via INTRAVENOUS

## 2018-08-12 MED ORDER — INSULIN ASPART 100 UNIT/ML ~~LOC~~ SOLN: 0.0000 [IU] | Freq: Three times a day (TID) | SUBCUTANEOUS | Status: DC

## 2018-08-12 MED ORDER — SODIUM CHLORIDE 0.9 % IV SOLN: INTRAVENOUS | Status: DC

## 2018-08-12 MED ORDER — TORSEMIDE 20 MG PO TABS
40.0000 mg | ORAL_TABLET | Freq: Every day | ORAL | Status: DC
  Administered 2018-08-12 – 2018-08-14 (×3): 40 mg via ORAL
  Filled 2018-08-12 (×3): qty 2

## 2018-08-12 MED ORDER — ONDANSETRON HCL 4 MG/2ML IJ SOLN: 4.0000 mg | Freq: Four times a day (QID) | INTRAMUSCULAR | Status: DC | PRN

## 2018-08-12 MED ORDER — FLUTICASONE FUROATE-VILANTEROL 100-25 MCG/INH IN AEPB
1.0000 | INHALATION_SPRAY | Freq: Every day | RESPIRATORY_TRACT | Status: DC
  Filled 2018-08-12: qty 28

## 2018-08-12 MED ORDER — INSULIN ASPART 100 UNIT/ML ~~LOC~~ SOLN: 0.0000 [IU] | Freq: Every day | SUBCUTANEOUS | Status: DC

## 2018-08-12 MED ORDER — LACTATED RINGERS IV SOLN
INTRAVENOUS | Status: DC
  Administered 2018-08-12: 09:00:00 via INTRAVENOUS

## 2018-08-12 MED ORDER — FENTANYL CITRATE (PF) 100 MCG/2ML IJ SOLN
50.0000 ug | Freq: Once | INTRAMUSCULAR | Status: AC
  Administered 2018-08-12: 50 ug via INTRAVENOUS

## 2018-08-12 MED ORDER — FENTANYL CITRATE (PF) 100 MCG/2ML IJ SOLN
INTRAMUSCULAR | Status: AC
  Filled 2018-08-12: qty 2

## 2018-08-12 MED ORDER — VASCULERA PO TABS: 1.0000 | ORAL_TABLET | Freq: Every day | ORAL | Status: DC

## 2018-08-12 MED ORDER — FLUTICASONE FUROATE-VILANTEROL 100-25 MCG/INH IN AEPB
1.0000 | INHALATION_SPRAY | Freq: Every day | RESPIRATORY_TRACT | Status: DC
  Administered 2018-08-13 – 2018-08-14 (×2): 1 via RESPIRATORY_TRACT
  Filled 2018-08-12: qty 28

## 2018-08-12 MED ORDER — ALLOPURINOL 100 MG PO TABS
100.0000 mg | ORAL_TABLET | Freq: Every day | ORAL | Status: DC
  Administered 2018-08-12 – 2018-08-14 (×3): 100 mg via ORAL
  Filled 2018-08-12 (×3): qty 1

## 2018-08-12 MED ORDER — ISOSORB DINITRATE-HYDRALAZINE 20-37.5 MG PO TABS
1.0000 | ORAL_TABLET | Freq: Three times a day (TID) | ORAL | Status: DC
  Administered 2018-08-12 – 2018-08-14 (×5): 1 via ORAL
  Filled 2018-08-12 (×7): qty 1

## 2018-08-12 MED ORDER — ONDANSETRON HCL 4 MG PO TABS: 4.0000 mg | ORAL_TABLET | Freq: Four times a day (QID) | ORAL | Status: DC | PRN

## 2018-08-12 MED ORDER — EPHEDRINE SULFATE 50 MG/ML IJ SOLN
INTRAMUSCULAR | Status: DC | PRN
  Administered 2018-08-12 (×4): 5 mg via INTRAVENOUS

## 2018-08-12 MED ORDER — BISACODYL 10 MG RE SUPP: 10.0000 mg | Freq: Every day | RECTAL | Status: DC | PRN

## 2018-08-12 MED ORDER — SENNOSIDES-DOCUSATE SODIUM 8.6-50 MG PO TABS: 1.0000 | ORAL_TABLET | Freq: Every evening | ORAL | Status: DC | PRN

## 2018-08-12 MED ORDER — SODIUM CHLORIDE 0.9 % IV SOLN
INTRAVENOUS | Status: DC
  Administered 2018-08-12: 15:00:00 via INTRAVENOUS

## 2018-08-12 MED ORDER — METOPROLOL TARTRATE 12.5 MG HALF TABLET
12.5000 mg | ORAL_TABLET | Freq: Two times a day (BID) | ORAL | Status: DC
  Administered 2018-08-12 – 2018-08-14 (×4): 12.5 mg via ORAL
  Filled 2018-08-12 (×4): qty 1

## 2018-08-12 MED ORDER — PROPOFOL 10 MG/ML IV BOLUS
INTRAVENOUS | Status: AC
  Filled 2018-08-12: qty 40

## 2018-08-12 MED ORDER — LIDOCAINE HCL (PF) 2 % IJ SOLN
INTRAMUSCULAR | Status: DC | PRN
  Administered 2018-08-12: 30 mg via INTRADERMAL

## 2018-08-12 SURGICAL SUPPLY — 25 items

## 2018-08-12 NOTE — Progress Notes (Signed)
Patient to recovery complaining of abdominal pain that is new since procedure. MD aware of patient pain and will come reevaluate.

## 2018-08-12 NOTE — Consult Note (Signed)
[name] Jul 17, 1941  [id number].    Requesting MD: Dr. Kennedy Bucker Chief Complaint/Reason for Consult: chronically incarcerated umbilical hernia  HPI:  This is a 78 yo white female with multiple medical problems, including CHF, CKD, cor pulmonale, a fib, not on anticoagulation due to recurrent GI bleeds from AVMs, history of UC, and COPD, who presented today for an outpatient colonoscopy to evaluate her colon as she had a GI bleed about a week ago.  During her procedure, her colon was not able to be traversed secondary to an incarcerated umbilical hernia containing some colon.  The procedure was terminated and she was complaining of some abdominal pain.  A plain 2 view film was obtained which did not show any pneumoperitoneum.  A noncontrasted CT scan was ordered as well.  We have been asked to see the patient secondary to this hernia.  The patient is groggy, but does state she has had this hernia for a long time, at least 15 years and likely longer than that.  She has intermittent abdominal pain around the hernia, but has never seen someone to discuss having it repaired.  She eats solid food and tolerated her colonoscopic prep last night well without and issues.    ROS: ROS Please see HPI, otherwise negative except O2 dependent at home.  ROS is somewhat limited by her groggy state after propofol from c-scope.  Family History  Problem Relation Age of Onset  . Stroke Mother   . Diabetes Mother   . Kidney cancer Mother   . Stomach cancer Mother   . Heart disease Mother   . Stroke Father   . Heart disease Brother   . Heart disease Brother   . Crohn's disease Brother   . Heart disease Sister        CATH,STENT  . Clotting disorder Sister   . Cervical cancer Sister   . Lung cancer Sister   . Heart attack Sister   . Diabetes Sister   . Colon cancer Neg Hx   . Esophageal cancer Neg Hx   . Rectal cancer Neg Hx     Past Medical History:  Diagnosis Date  . Allergy   .  Angiodysplasia of cecum   . Angiodysplasia of duodenum   . Arthritis   . Asteatotic eczema 02/25/2016  . Asthma   . Atrial fibrillation ([place]) 10/17/2017  . Blood transfusion without reported diagnosis   . Cataract   . Chest pain at rest 03/22/2010   Qualifier: Diagnosis of  By: Ronnald Ramp MD, Arvid Right.   . CHF (congestive heart failure) ([place]) 08/31/03  . CHF (congestive heart failure), NYHA class III, chronic, combined ([place]) 08/10/2017  . Chronic venous stasis dermatitis of both lower extremities 06/02/2013  . CKD (chronic kidney disease), stage III ([place]) 06/20/2010   Estimated Creatinine Clearance: 34.8 mL/min (A) (by C-G formula based on SCr of 1.37 mg/dL (H)).   Marland Kitchen COLD (chronic obstructive lung disease) ([place])   . Colonic polyp 02/16/2008   Tubular adenoma  . COPD (chronic obstructive pulmonary disease) ([place]) 01/27/98  . Cor pulmonale ([place]) 11/16/2014  . D-dimer, elevated 01/01/2018  . GERD 07/30/1992  . GERD (gastroesophageal reflux disease) 07/30/92  . Gout   . Heart murmur   . HTN (hypertension) 07/30/1988  . Hyperlipidemia 11/27/96  . Hyperlipidemia with target LDL less than 100 11/27/1996       . Kidney stone 1960, 1972, 1991  . MGUS (monoclonal gammopathy of unknown significance) 11/08/2016  .  Morbid obesity ([place]) 04/19/2010   She agrees to work on her lifestyle modifications to help her lose weight.   . Neck pain on right side 01/01/2018  . OSA (obstructive sleep apnea) 06/22/2013  . Osteopenia, senile 02/05/2013   July 2014  -2.1 left femur -1.9 left forearm   . Other screening mammogram 02/05/2013  . Oxygen deficiency   . Prediabetes 11/13/2006  . Renal insufficiency   . Stenosis of cervical spine with myelopathy ([place]) 01/01/2018  . Ulcer of leg, chronic, right ([place]) 10/10/2011  . Ulcerative colitis   . ULCERATIVE COLITIS-LEFT SIDE 03/19/2008       . Vitamin B12 deficiency anemia 11/13/2006       . Vitamin D deficiency 02/06/2013    Past Surgical History:  Procedure Laterality Date  .  BRONCHOSCOPY    . COLONOSCOPY    . COLONOSCOPY WITH PROPOFOL N/A 02/23/2015   Procedure: COLONOSCOPY WITH PROPOFOL;  Surgeon: Ladene Artist, MD;  Location: [facility] ENDOSCOPY;  Service: Endoscopy;  Laterality: N/A;  . ESOPHAGOGASTRODUODENOSCOPY (EGD) WITH PROPOFOL N/A 02/23/2015   Procedure: ESOPHAGOGASTRODUODENOSCOPY (EGD) WITH PROPOFOL;  Surgeon: Ladene Artist, MD;  Location: [facility] ENDOSCOPY;  Service: Endoscopy;  Laterality: N/A;  . NO PAST SURGERIES    . POLYPECTOMY      Social History:  reports that she quit smoking about 22 years ago. She has never used smokeless tobacco. She reports that she does not drink alcohol or use drugs.  Allergies:  Allergies  Allergen Reactions  . Amoxicillin Diarrhea    DID THE REACTION INVOLVE: Swelling of the face/tongue/throat, SOB, or low BP? N Sudden or severe rash/hives, skin peeling, or the inside of the mouth or nose? N Did it require medical treatment? N When did it last happen?MORE THAN 10 YEARS If all above answers are "NO", may proceed with cephalosporin use.   . Celebrex [Celecoxib]     edema  . Enalapril Cough  . Lipitor [Atorvastatin]     Muscle aches  . Amlodipine Besylate     REACTION: edema  . Codeine Sulfate     REACTION: Nausea  . Hydrocodone-Acetaminophen     REACTION: Nausea    Medications Prior to Admission  Medication Sig Dispense Refill  . acetaminophen (TYLENOL) 325 MG tablet Take 325-650 mg by mouth daily as needed for moderate pain, fever or headache.     . allopurinol (ZYLOPRIM) 100 MG tablet TAKE 1 TABLET BY MOUTH EVERY DAY FOR GOUT (Patient taking differently: Take 100 mg by mouth daily. TAKE 1 TABLET BY MOUTH EVERY DAY FOR GOUT) 90 tablet 0  . BIDIL 20-37.5 MG tablet TAKE 1 TABLET BY MOUTH THREE TIMES DAILY 270 tablet 0  . cyanocobalamin 2000 MCG tablet Take 1 tablet (2,000 mcg total) by mouth daily. (Patient taking differently: Take 2,000 mcg by mouth every evening. ) 90 tablet 3  . Dietary Management Product  (VASCULERA) TABS TAKE ONE TABLET BY MOUTH EVERY DAY 90 tablet 1  . ferrous sulfate 325 (65 FE) MG tablet Take 1 tablet (325 mg total) by mouth 2 (two) times daily with a meal. 180 tablet 1  . fluticasone furoate-vilanterol (BREO ELLIPTA) 100-25 MCG/INH AEPB Inhale 1 puff into the lungs daily. 60 each 11  . metoprolol tartrate (LOPRESSOR) 50 MG tablet Take 50 mg by mouth 2 (two) times daily. Cuts 50 mg tablets into fourths. Takes two times daily. According to patient.    . torsemide (DEMADEX) 20 MG tablet TAKE 1 TABLET(20 MG) BY MOUTH TWICE DAILY (  Patient taking differently: Take 40 mg by mouth daily. ) 180 tablet 0  . Vitamin D, Ergocalciferol, (DRISDOL) 50000 units CAPS capsule TAKE 50,000 CAPSULE BY MOUTH ONCE WEEKLY 12 capsule 1  . albuterol (PROAIR HFA) 108 (90 Base) MCG/ACT inhaler Inhale 1-2 puffs into the lungs every 6 (six) hours as needed for wheezing or shortness of breath. 18 g 11  . metoprolol tartrate (LOPRESSOR) 25 MG tablet Take 0.5 tablets (12.5 mg total) by mouth 2 (two) times daily. 90 tablet 3     Physical Exam: Blood pressure (!) 166/51, pulse 71, temperature 97.6 F (36.4 C), temperature source Oral, resp. rate 20, height 5' (1.524 m), weight 91.7 kg, SpO2 99 %. General: pleasant, obese white female who is laying in bed in NAD upon my arrival HEENT: head is normocephalic, atraumatic.  Sclera are noninjected.  PERRL.  Ears and nose without any masses or lesions.  Mouth is pink and moist Heart: regular, rate, and rhythm.  Normal s1,s2. No obvious murmurs, gallops, or rubs noted.  Palpable radial and pedal pulses bilaterally Lungs: CTAB, no wheezes, rhonchi, or rales noted.  Respiratory effort nonlabored on O2. Abd: soft, tender over her hernia, ND/obese, +BS, no masses or organomegaly.  Patient was given 15mg of fentanyl and placed in trendelenburg.  Her hernia was partially reduced, but unable to fully reduce.  She kept ultimately pushing against and moaning with some pain.   The procedure was ultimately terminated. MS: all 4 extremities are symmetrical with no cyanosis, clubbing, or edema. Skin: warm and dry with no masses, lesions, or rashes Psych: A&Ox3 but more groggy, especially after more narcotics given.   No results found for this or any previous visit (from the past 48 hour(s)). Dg Abd Portable 2v  Result Date: 08/12/2018 CLINICAL DATA:  Generalized abdominal pain after colonoscopy which was performed earlier today. EXAM: PORTABLE ABDOMEN - 2 VIEW COMPARISON:  CT abdomen and pelvis 03/19/2016. Acute abdomen series 04/22/2014. FINDINGS: Bowel gas pattern unremarkable without evidence of obstruction or significant ileus. No evidence of free air or significant air-fluid levels on the lateral decubitus image. Expected stool burden in the colon. No visible opaque urinary tract calculi. Aortoiliac atherosclerosis without evidence of aneurysm. Degenerative changes involving the lower thoracic and lumbar spine. IMPRESSION: No acute abdominal abnormality. Electronically Signed   By: TEvangeline DakinM.D.   On: 08/12/2018 10:53      Assessment/Plan Chronically incarcerated umbilical hernia The patient appears to have a chronically incarcerated umbilical hernia.  I pulled up her last CT scan in 2017 which shows this hernia present with bowel in it then as well.  Contrast past through at that time indicating no evidence of obstruction. She tolerated her bowel prep last night to suggest that this hernia is not a source of obstruction, just chronic incarceration.  As long as the patient can pass flatus and tolerate her diet, we do not plan to emergently repair her hernia right now.  She can follow up with one of the surgeons as an outpatient to discuss elective repair. Also given her multitude of medical problems, she would likely need outpatient pulmonary and cardiology evaluation for clearance.    I have discussed this with the GI PA.  She is currently scheduled for a CT  scan to rule out a perforation or some type of colonoscopic complication.  We will follow up on this as well.  However, if no injury noted, she can have a diet and if tolerates, she can follow up  with [facility] as an outpatient to discuss hernia repair.  H/O Ulcerative colitis  HTN A fib Cor pulmonale COPD/O2 dependence HLD CKD MGUS OSA Obesity    Henreitta Cea, [facility] Surgery 08/12/2018, 12:11 PM Pager: [phone]

## 2018-08-12 NOTE — H&P (Signed)
History and Physical    [name] OMV:[id number] DOB: [birth year] DOA: 08/12/2018  PCP: [name], MD  Patient coming from: home for outpt c-scope  I have personally briefly reviewed patient's old medical records in [place]  Chief Complaint: abd pain with chronic hernia-prevented c-scope in endo today, surg also seeing  HPI: [name] is a 78 y.o. female with medical history significant of prediabetes, CHF with diastolic heart failure, A. fib, obesity and prediabetes as well as known AVMs with colitis and associated anemia who was here for outpatient colonoscopy today for evaluation of anemia.  As noted by GI patient has history of right colon and duodenal AVMs and a longstanding periumbilical hernia.  Patient underwent bowel prep without complication but colonoscopy was terminated because of the inability to pass the scope through the transverse colon secondary to the chronic hernia incarceration.  Post procedure there was no evidence of perforation but because of abdominal pain/no flatus she was consulted to the hospitalist service as well as general surgery with CT scan of abdomen pelvis pending.  In the endoscopy suite patient did report bowel prep was uncomplicated ruling out obstruction but  abdominal pain persists.  General surgery was consulted by GI and is administering pain medications to attempt to reduce the hernia.  ED Course: Endoscopy suite patient received fentanyl for pain, CT scan of abdomen pelvis pending, general surgery seen the patient in consultation-pending evaluation for reducibility of hernia there were no new labs CBC BMP lactic acid all pending.  Portable KUB not acute findings  Review of Systems: As per HPI otherwise 10 point review of systems negative.    Past Medical History:  Diagnosis Date  . Allergy   . Angiodysplasia of cecum   . Angiodysplasia of duodenum   . Arthritis   . Asteatotic eczema 02/25/2016  . Asthma   . Atrial  fibrillation ([place]) 10/17/2017  . Blood transfusion without reported diagnosis   . Cataract   . Chest pain at rest 03/22/2010   Qualifier: Diagnosis of  By: Ronnald Ramp MD, Arvid Right.   . CHF (congestive heart failure) ([place]) 08/31/03  . CHF (congestive heart failure), NYHA class III, chronic, combined ([place]) 08/10/2017  . Chronic venous stasis dermatitis of both lower extremities 06/02/2013  . CKD (chronic kidney disease), stage III ([place]) 06/20/2010   Estimated Creatinine Clearance: 34.8 mL/min (A) (by C-G formula based on SCr of 1.37 mg/dL (H)).   Marland Kitchen COLD (chronic obstructive lung disease) ([place])   . Colonic polyp 02/16/2008   Tubular adenoma  . COPD (chronic obstructive pulmonary disease) ([place]) 01/27/98  . Cor pulmonale ([place]) 11/16/2014  . D-dimer, elevated 01/01/2018  . GERD 07/30/1992  . GERD (gastroesophageal reflux disease) 07/30/92  . Gout   . Heart murmur   . HTN (hypertension) 07/30/1988  . Hyperlipidemia 11/27/96  . Hyperlipidemia with target LDL less than 100 11/27/1996       . Kidney stone 1960, 1972, 1991  . MGUS (monoclonal gammopathy of unknown significance) 11/08/2016  . Morbid obesity ([place]) 04/19/2010   She agrees to work on her lifestyle modifications to help her lose weight.   . Neck pain on right side 01/01/2018  . OSA (obstructive sleep apnea) 06/22/2013  . Osteopenia, senile 02/05/2013   July 2014  -2.1 left femur -1.9 left forearm   . Other screening mammogram 02/05/2013  . Oxygen deficiency   . Prediabetes 11/13/2006  . Renal insufficiency   . Stenosis of cervical spine with myelopathy (  [place]) 01/01/2018  . Ulcer of leg, chronic, right ([place]) 10/10/2011  . Ulcerative colitis   . ULCERATIVE COLITIS-LEFT SIDE 03/19/2008       . Vitamin B12 deficiency anemia 11/13/2006       . Vitamin D deficiency 02/06/2013    Past Surgical History:  Procedure Laterality Date  . BRONCHOSCOPY    . COLONOSCOPY    . COLONOSCOPY WITH PROPOFOL N/A 02/23/2015   Procedure: COLONOSCOPY WITH PROPOFOL;  Surgeon:  Ladene Artist, MD;  Location: [facility];  Service: Endoscopy;  Laterality: N/A;  . ESOPHAGOGASTRODUODENOSCOPY (EGD) WITH PROPOFOL N/A 02/23/2015   Procedure: ESOPHAGOGASTRODUODENOSCOPY (EGD) WITH PROPOFOL;  Surgeon: Ladene Artist, MD;  Location: [facility];  Service: Endoscopy;  Laterality: N/A;  . NO PAST SURGERIES    . POLYPECTOMY       reports that she quit smoking about 22 years ago. She has never used smokeless tobacco. She reports that she does not drink alcohol or use drugs.  Allergies  Allergen Reactions  . Amoxicillin Diarrhea    DID THE REACTION INVOLVE: Swelling of the face/tongue/throat, SOB, or low BP? N Sudden or severe rash/hives, skin peeling, or the inside of the mouth or nose? N Did it require medical treatment? N When did it last happen?MORE THAN 10 YEARS If all above answers are "NO", may proceed with cephalosporin use.   . Celebrex [Celecoxib]     edema  . Enalapril Cough  . Lipitor [Atorvastatin]     Muscle aches  . Amlodipine Besylate     REACTION: edema  . Codeine Sulfate     REACTION: Nausea  . Hydrocodone-Acetaminophen     REACTION: Nausea    Family History  Problem Relation Age of Onset  . Stroke Mother   . Diabetes Mother   . Kidney cancer Mother   . Stomach cancer Mother   . Heart disease Mother   . Stroke Father   . Heart disease Brother   . Heart disease Brother   . Crohn's disease Brother   . Heart disease Sister        CATH,STENT  . Clotting disorder Sister   . Cervical cancer Sister   . Lung cancer Sister   . Heart attack Sister   . Diabetes Sister   . Colon cancer Neg Hx   . Esophageal cancer Neg Hx   . Rectal cancer Neg Hx      Prior to Admission medications   Medication Sig Start Date End Date Taking? Authorizing Provider  acetaminophen (TYLENOL) 325 MG tablet Take 325-650 mg by mouth daily as needed for moderate pain, fever or headache.    Yes [provider]  allopurinol (ZYLOPRIM) 100 MG tablet  TAKE 1 TABLET BY MOUTH EVERY DAY FOR GOUT Patient taking differently: Take 100 mg by mouth daily. TAKE 1 TABLET BY MOUTH EVERY DAY FOR GOUT 06/05/18  Yes [name], MD  BIDIL 20-37.5 MG tablet TAKE 1 TABLET BY MOUTH THREE TIMES DAILY 04/11/18  Yes [name], MD  cyanocobalamin 2000 MCG tablet Take 1 tablet (2,000 mcg total) by mouth daily. Patient taking differently: Take 2,000 mcg by mouth every evening.  02/24/16  Yes [name], MD  Dietary Management Product (VASCULERA) TABS TAKE ONE TABLET BY MOUTH EVERY DAY 06/18/18  Yes [name], MD  ferrous sulfate 325 (65 FE) MG tablet Take 1 tablet (325 mg total) by mouth 2 (two) times daily with a meal. 02/05/18  Yes [name], MD  fluticasone furoate-vilanterol (BREO ELLIPTA) 100-25 MCG/INH AEPB Inhale 1 puff into the lungs daily. 05/13/18  Yes [name], MD  metoprolol tartrate (LOPRESSOR) 50 MG tablet Take 50 mg by mouth 2 (two) times daily. Cuts 50 mg tablets into fourths. Takes two times daily. According to patient.   Yes [provider]  torsemide (DEMADEX) 20 MG tablet TAKE 1 TABLET(20 MG) BY MOUTH TWICE DAILY Patient taking differently: Take 40 mg by mouth daily.  07/31/18  Yes [name], MD  Vitamin D, Ergocalciferol, (DRISDOL) 50000 units CAPS capsule TAKE 50,000 CAPSULE BY MOUTH ONCE WEEKLY 02/20/18  Yes [name], MD  albuterol (PROAIR HFA) 108 (90 Base) MCG/ACT inhaler Inhale 1-2 puffs into the lungs every 6 (six) hours as needed for wheezing or shortness of breath. 10/30/16   [name], MD  metoprolol tartrate (LOPRESSOR) 25 MG tablet Take 0.5 tablets (12.5 mg total) by mouth 2 (two) times daily. 03/03/18 08/04/18  Thompson Grayer, MD    Physical Exam: Vitals:   08/12/18 1100 08/12/18 1110 08/12/18 1120 08/12/18 1130  BP: (!) 162/51 (!) 159/44 (!) 155/37 (!) 166/51  Pulse: 69 69 68 71  Resp: 20 18 16 20   Temp:      TempSrc:      SpO2: 99% 100% 100% 99%  Weight:      Height:         Constitutional: NAD, calm, comfortable Vitals:   08/12/18 1100 08/12/18 1110 08/12/18 1120 08/12/18 1130  BP: (!) 162/51 (!) 159/44 (!) 155/37 (!) 166/51  Pulse: 69 69 68 71  Resp: 20 18 16 20   Temp:      TempSrc:      SpO2: 99% 100% 100% 99%  Weight:      Height:       Eyes: PERRL, lids and conjunctivae normal ENMT: Mucous membranes are moist. Posterior pharynx clear of any exudate or lesions.Normal dentition.  Neck: normal, supple, no masses, no thyromegaly Respiratory: clear to auscultation bilaterally, no wheezing, no crackles. Normal respiratory effort. No accessory muscle use.  Cardiovascular: Regular rate and rhythm, no murmurs / rubs / gallops. No extremity edema. 2+ pedal pulses. No carotid bruits.  Abdomen: Ventral hernia, protuberant abdomen, mildly tender to palpation over hernia site, distant bowel sounds.  Musculoskeletal: no clubbing / cyanosis. No joint deformity upper and lower extremities. Good ROM, no contractures. Normal muscle tone.  Skin: no rashes, lesions, ulcers. No induration Neurologic: CN 2-12 grossly intact. Sensation intact, DTR normal. Strength 5/5 in all 4.  Psychiatric: Normal judgment and insight. Alert and oriented x 3. Normal but anxious    Labs on Admission: I have personally reviewed following labs and imaging studies  CBC: Recent Labs  Lab 08/05/18 1515 08/06/18 0711  WBC  --  7.6  HGB 8.6* 8.1*  HCT  --  25.4*  MCV  --  81.2  PLT  --  413*   Basic Metabolic Panel: Recent Labs  Lab 08/06/18 0711  NA 140  K 5.0  CL 107  CO2 26  GLUCOSE 93  BUN 38*  CREATININE 1.44*  CALCIUM 9.2   GFR: Estimated Creatinine Clearance: 32.5 mL/min (A) (by C-G formula based on SCr of 1.44 mg/dL (H)). Liver Function Tests: No results for input(s): AST, ALT, ALKPHOS, BILITOT, PROT, ALBUMIN in the last 168 hours. No results for input(s): LIPASE, AMYLASE in the last 168 hours. No results for input(s): AMMONIA in the last 168  hours. Coagulation Profile: No results for input(s):  INR, PROTIME in the last 168 hours. Cardiac Enzymes: No results for input(s): CKTOTAL, CKMB, CKMBINDEX, TROPONINI in the last 168 hours. BNP (last 3 results) Recent Labs    01/01/18 0907  PROBNP 145.0*   HbA1C: No results for input(s): HGBA1C in the last 72 hours. CBG: No results for input(s): GLUCAP in the last 168 hours. Lipid Profile: No results for input(s): CHOL, HDL, LDLCALC, TRIG, CHOLHDL, LDLDIRECT in the last 72 hours. Thyroid Function Tests: No results for input(s): TSH, T4TOTAL, FREET4, T3FREE, THYROIDAB in the last 72 hours. Anemia Panel: No results for input(s): VITAMINB12, FOLATE, FERRITIN, TIBC, IRON, RETICCTPCT in the last 72 hours. Urine analysis:    Component Value Date/Time   COLORURINE YELLOW 11/05/2017 1103   APPEARANCEUR CLEAR 11/05/2017 1103   LABSPEC <=1.005 (A) 11/05/2017 1103   PHURINE 6.5 11/05/2017 1103   GLUCOSEU NEGATIVE 11/05/2017 1103   HGBUR NEGATIVE 11/05/2017 1103   HGBUR moderate 02/02/2008 1439   BILIRUBINUR NEGATIVE 11/05/2017 1103   KETONESUR NEGATIVE 11/05/2017 1103   PROTEINUR 100 (A) 06/01/2013 1722   UROBILINOGEN 0.2 11/05/2017 1103   NITRITE NEGATIVE 11/05/2017 1103   LEUKOCYTESUR NEGATIVE 11/05/2017 1103    Radiological Exams on Admission: Dg Abd Portable 2v  Result Date: 08/12/2018 CLINICAL DATA:  Generalized abdominal pain after colonoscopy which was performed earlier today. EXAM: PORTABLE ABDOMEN - 2 VIEW COMPARISON:  CT abdomen and pelvis 03/19/2016. Acute abdomen series 04/22/2014. FINDINGS: Bowel gas pattern unremarkable without evidence of obstruction or significant ileus. No evidence of free air or significant air-fluid levels on the lateral decubitus image. Expected stool burden in the colon. No visible opaque urinary tract calculi. Aortoiliac atherosclerosis without evidence of aneurysm. Degenerative changes involving the lower thoracic and lumbar spine. IMPRESSION: No  acute abdominal abnormality. Electronically Signed   By: Evangeline Dakin M.D.   On: 08/12/2018 10:53    EKG: Independently reviewed. none  Assessment/Plan Principal Problem:   Hernia, ventral Active Problems:   HTN (hypertension)   GERD   Prediabetes   CHF (congestive heart failure), NYHA class III, chronic, combined (HCC)   Atrial fibrillation (HCC)   Anemia due to chronic blood loss   Hernia cerebri (HCC)   Abdominal pain    Ventral hernia chronic with abdominal pain.  This is a chronic known hernia.  General surgery seeing in consultation to eval for reducibility.  If they are able to anticipate patient follow-up.  CT of abdomen and pelvis is pending although obstruction unlikely given patient successful bowel prep, will check a CBC BMP and lactic acid.  Prediabetes.  We will place the patient on sliding scale insulin check blood glucose before meals at bedtime, diabetic diet  CHF class III chronic combined.  Gentle hydration x 1 day 2/2 bowel prep and poor po intake, patient is not in acute exacerbation continue her home medications outpatient follow-up  A. fib again continue patient's home medications.  No changes  Anemia due to chronic blood loss.  GI following.  Evaluation today was aborted because of complications from hernia.  We will recheck cbc now and a CBC in the morning.  Further work-up per GI   DVT prophylaxis: scd Code Status: full Family Communication: fam member at bedside Disposition Plan: home 1 day Consults called: gen surg by GI-PA seeing Admission status: obs med surg   Nicolette Bang MD [facility]  If 7PM-7AM, please contact night-coverage [email or web address] Password [facility]  08/12/2018, 11:52 AM

## 2018-08-12 NOTE — Anesthesia Preprocedure Evaluation (Addendum)
Anesthesia Evaluation  Patient identified by MRN, date of birth, ID band Patient awake    Reviewed: Allergy & Precautions, NPO status , Patient's Chart, lab work & pertinent test results  History of Anesthesia Complications Negative for: history of anesthetic complications  Airway Mallampati: II  TM Distance: >3 FB Neck ROM: Full    Dental  (+) Dental Advisory Given, Upper Dentures   Pulmonary asthma , sleep apnea , COPD (on 2L O2 at home),  oxygen dependent, former smoker,    Pulmonary exam normal breath sounds clear to auscultation       Cardiovascular hypertension, Pt. on medications +CHF  Normal cardiovascular exam+ dysrhythmias Atrial Fibrillation  Rhythm:Regular Rate:Normal + Systolic murmurs TTE 0/9326: moderate LVH, EF 65-70%, mild MR   Neuro/Psych negative neurological ROS     GI/Hepatic Neg liver ROS, PUD, GERD  ,  Endo/Other  Morbid obesity  Renal/GU Renal InsufficiencyRenal disease     Musculoskeletal  (+) Arthritis , Osteoarthritis,    Abdominal   Peds  Hematology  (+) anemia ,   Anesthesia Other Findings Day of surgery medications reviewed with the patient.  Reproductive/Obstetrics                            Anesthesia Physical Anesthesia Plan  ASA: III  Anesthesia Plan: MAC   Post-op Pain Management:    Induction:   PONV Risk Score and Plan: Treatment may vary due to age or medical condition and Propofol infusion  Airway Management Planned: Natural Airway and Nasal Cannula  Additional Equipment:   Intra-op Plan:   Post-operative Plan:   Informed Consent: I have reviewed the patients History and Physical, chart, labs and discussed the procedure including the risks, benefits and alternatives for the proposed anesthesia with the patient or authorized representative who has indicated his/her understanding and acceptance.   Dental advisory given  Plan Discussed  with: CRNA  Anesthesia Plan Comments:        Anesthesia Quick Evaluation

## 2018-08-12 NOTE — Progress Notes (Signed)
Fuller Plan MD at bedside speaking with patient, awaiting XR results.

## 2018-08-12 NOTE — Op Note (Signed)
[facility] Patient Name: [name] Procedure Date: 08/12/2018 MRN: [id number] Attending MD: Ladene Artist , MD Date of Birth: July 20, 1941 CSN: [id number] Age: 78 Admit Type: Outpatient Procedure:                Upper GI endoscopy Indications:              Iron deficiency anemia secondary to chronic blood                            loss Providers:                Pricilla Riffle. Fuller Plan, MD, Cleda Daub, RN, Charolette Child, Technician, Karis Juba, CRNA Referring MD:             [name], MD Medicines:                Monitored Anesthesia Care Complications:            No immediate complications. Estimated Blood Loss:     Estimated blood loss: none. Procedure:                Pre-Anesthesia Assessment:                           - Prior to the procedure, a History and Physical                            was performed, and patient medications and                            allergies were reviewed. The patient's tolerance of                            previous anesthesia was also reviewed. The risks                            and benefits of the procedure and the sedation                            options and risks were discussed with the patient.                            All questions were answered, and informed consent                            was obtained. Prior Anticoagulants: The patient has                            taken no previous anticoagulant or antiplatelet                            agents. ASA Grade Assessment: III - A patient with  severe systemic disease. After reviewing the risks                            and benefits, the patient was deemed in                            satisfactory condition to undergo the procedure.                           After obtaining informed consent, the endoscope was                            passed under direct vision. Throughout the       procedure, the patient's blood pressure, pulse, and                            oxygen saturations were monitored continuously. The                            GIF-H190 ([id number]) Olympus gastroscope was                            introduced through the mouth, and advanced to the                            third part of duodenum. The upper GI endoscopy was                            accomplished without difficulty. The patient                            tolerated the procedure well. Scope In: Scope Out: Findings:      The examined esophagus was normal.      A single 2 mm no bleeding angiodysplastic lesion was found in the       gastric antrum. Coagulation for bleeding prevention using argon plasma       was successful.      The exam of the stomach was otherwise normal.      A single 3 mm angiodysplastic lesion without bleeding was found in the       duodenal bulb. Coagulation for bleeding prevention using argon plasma       was successful.      The second portion of the duodenum and third portion of the duodenum       were normal. Impression:               - Normal esophagus.                           - A single non-bleeding angiodysplastic lesion in                            the stomach. Treated with argon plasma coagulation                            (APC).                           -  A single non-bleeding angiodysplastic lesion in                            the duodenum. Treated with argon plasma coagulation                            (APC).                           - Normal second portion of the duodenum and third                            portion of the duodenum.                           - No specimens collected. Moderate Sedation:      Not Applicable - Patient had care per Anesthesia. Recommendation:           - Patient has a contact number available for                            emergencies. The signs and symptoms of potential                            delayed  complications were discussed with the                            patient. Return to normal activities tomorrow.                            Written discharge instructions were provided to the                            patient.                           - Clear liquid diet today, then advance as                            tolerated to soft diet for 1 day.                           - Continue present medications.                           - Return to GI office in 2 months.                           - PCP follow up as planned. Procedure Code(s):        --- Professional ---                           [phone], Esophagogastroduodenoscopy, flexible,                            transoral; with control of bleeding, any method  Diagnosis Code(s):        --- Professional ---                           X93.716, Angiodysplasia of stomach and duodenum                            without bleeding                           D50.0, Iron deficiency anemia secondary to blood                            loss (chronic) CPT copyright 2018 American Medical Association. All rights reserved. The codes documented in this report are preliminary and upon coder review may  be revised to meet current compliance requirements. Ladene Artist, MD 08/12/2018 10:22:50 AM This report has been signed electronically. Number of Addenda: 0

## 2018-08-12 NOTE — Discharge Instructions (Signed)
YOU HAD AN ENDOSCOPIC PROCEDURE TODAY: Refer to the procedure report and other information in the discharge instructions given to you for any specific questions about what was found during the examination. If this information does not answer your questions, please call [place] office at [phone] to clarify.  ° °YOU SHOULD EXPECT: Some feelings of bloating in the abdomen. Passage of more gas than usual. Walking can help get rid of the air that was put into your GI tract during the procedure and reduce the bloating. If you had a lower endoscopy (such as a colonoscopy or flexible sigmoidoscopy) you may notice spotting of blood in your stool or on the toilet paper. Some abdominal soreness may be present for a day or two, also. ° °DIET: Your first meal following the procedure should be a light meal and then it is ok to progress to your normal diet. A half-sandwich or bowl of soup is an example of a good first meal. Heavy or fried foods are harder to digest and may make you feel nauseous or bloated. Drink plenty of fluids but you should avoid alcoholic beverages for 24 hours. If you had a esophageal dilation, please see attached instructions for diet.   ° °ACTIVITY: Your care partner should take you home directly after the procedure. You should plan to take it easy, moving slowly for the rest of the day. You can resume normal activity the day after the procedure however YOU SHOULD NOT DRIVE, use power tools, machinery or perform tasks that involve climbing or major physical exertion for 24 hours (because of the sedation medicines used during the test).  ° °SYMPTOMS TO REPORT IMMEDIATELY: °A gastroenterologist can be reached at any hour. Please call [phone]  for any of the following symptoms:  °Following lower endoscopy (colonoscopy, flexible sigmoidoscopy) °Excessive amounts of blood in the stool  °Significant tenderness, worsening of abdominal pains  °Swelling of the abdomen that is new, acute  °Fever of 100° or  higher  °Following upper endoscopy (EGD, EUS, ERCP, esophageal dilation) °Vomiting of blood or coffee ground material  °New, significant abdominal pain  °New, significant chest pain or pain under the shoulder blades  °Painful or persistently difficult swallowing  °New shortness of breath  °Black, tarry-looking or red, bloody stools ° °FOLLOW UP:  °If any biopsies were taken you will be contacted by phone or by letter within the next 1-3 weeks. Call [phone]  if you have not heard about the biopsies in 3 weeks.  °Please also call with any specific questions about appointments or follow up tests. ° °

## 2018-08-12 NOTE — Plan of Care (Signed)

## 2018-08-12 NOTE — Progress Notes (Signed)
Fuller Plan MD updating family at this time about plan of care to admit patient to the hospital at this time. Pt A&O X4, continues to c/o pain - MD aware.

## 2018-08-12 NOTE — Op Note (Signed)
[facility] Patient Name: [name] Procedure Date: 08/12/2018 MRN: [id number] Attending MD: Ladene Artist , MD Date of Birth: Sep 02, 1940 CSN: [id number] Age: 78 Admit Type: Outpatient Procedure:                Colonoscopy Indications:              Hematochezia, Anemia secondary due to blood loss Providers:                Pricilla Riffle. Fuller Plan, MD, Cleda Daub, RN, Charolette Child, Technician, Karis Juba, CRNA Referring MD:             [name], MD Medicines:                Monitored Anesthesia Care Complications:            No immediate complications. Estimated blood loss:                            None. Estimated Blood Loss:     Estimated blood loss: none. Estimated blood loss                            was minimal. Procedure:                Pre-Anesthesia Assessment:                           - Prior to the procedure, a History and Physical                            was performed, and patient medications and                            allergies were reviewed. The patient's tolerance of                            previous anesthesia was also reviewed. The risks                            and benefits of the procedure and the sedation                            options and risks were discussed with the patient.                            All questions were answered, and informed consent                            was obtained. Prior Anticoagulants: The patient has                            taken no previous anticoagulant or antiplatelet  agents. ASA Grade Assessment: III - A patient with                            severe systemic disease. After reviewing the risks                            and benefits, the patient was deemed in                            satisfactory condition to undergo the procedure.                           After obtaining informed consent, the colonoscope           was passed under direct vision. Throughout the                            procedure, the patient's blood pressure, pulse, and                            oxygen saturations were monitored continuously. The                            PCF-H190DL ([id number]) Olympus pediatric colonscope                            was introduced through the anus and advanced to the                            transverse colon. The rectum was photographed. The                            quality of the bowel preparation was good. The                            patient tolerated the procedure well. The                            colonoscopy was technically difficult and complex                            due to bowel stenosis, tortuous lumen at the                            periumbilical hernia. Despite direct pressure on                            the hernia it could not be reduced. The procedure                            was aided by using manual pressure on the hernia                            however unable to advance  beyond the hernia. Scope In: 9:19:31 AM Scope Out: 9:37:18 AM Total Procedure Duration: 0 hours 17 minutes 47 seconds  Findings:      The perianal and digital rectal examinations were normal.      A moderate stenosis, tortuosity was found in the transverse colon at the       periumbilical hernia and was non-traversed. The colon was incarcerated       in the periumbilical hernia. This was confirmed with observation and       palpation of the abdominal wall.      A 7 mm polyp was found in the transverse colon. The polyp was sessile.       Not removed as it was located in the herniated bowel.      A single small localized angiodysplastic lesion with bleeding was found       in the transverse colon. Coagulation for hemostasis using argon plasma       was successful.      The exam was otherwise without abnormality on direct and retroflexion       views.      Multiple medium-mouthed  diverticula were found in the left colon. Impression:               - Stenosis in the transverse colon at the                            periumbilical hernia.                           - One 7 mm polyp in the transverse colon. Not                            removed.                           - A single bleeding transverse colon                            angiodysplastic lesion. Treated with argon plasma                            coagulation (APC).                           - The examination was otherwise normal on direct                            and retroflexion views.                           - Diverticulosis in the left colon.                           - No specimens collected. Moderate Sedation:      Not Applicable - Patient had care per Anesthesia. Recommendation:           - Patient has a contact number available for  emergencies. The signs and symptoms of potential                            delayed complications were discussed with the                            patient. Return to normal activities tomorrow.                            Written discharge instructions were provided to the                            patient.                           - Clear liquid diet today then advance as tolerated                            tomorrow.                           - Continue present medications.                           - No recommendation at this time regarding repeat                            colonoscopy. Consider colonoscopy after repair of                            periumbilical hernia.                           - Refer to a surgeon for apparent incarcerated                            periumbilical hernia. Procedure Code(s):        --- Professional ---                           [phone], 8, Colonoscopy, flexible; with control of                            bleeding, any method Diagnosis Code(s):        --- Professional ---                            U98.119, Other intestinal obstruction unspecified                            as to partial versus complete obstruction                           D12.3, Benign neoplasm of transverse colon (hepatic                            flexure  or splenic flexure)                           K55.21, Angiodysplasia of colon with hemorrhage                           K92.1, Melena (includes Hematochezia)                           D50.0, Iron deficiency anemia secondary to blood                            loss (chronic)                           K57.30, Diverticulosis of large intestine without                            perforation or abscess without bleeding CPT copyright 2018 American Medical Association. All rights reserved. The codes documented in this report are preliminary and upon coder review may  be revised to meet current compliance requirements. Ladene Artist, MD 08/12/2018 10:17:15 AM This report has been signed electronically. Number of Addenda: 0

## 2018-08-12 NOTE — Transfer of Care (Signed)
Immediate Anesthesia Transfer of Care Note  Patient: [name]  Procedure(s) Performed: COLONOSCOPY WITH PROPOFOL (N/A ) ESOPHAGOGASTRODUODENOSCOPY (EGD) WITH PROPOFOL (N/A ) HOT HEMOSTASIS (ARGON PLASMA COAGULATION/BICAP) (N/A )  Patient Location: PACU and Endoscopy Unit  Anesthesia Type:MAC  Level of Consciousness: awake, alert  and oriented  Airway & Oxygen Therapy: Patient Spontanous Breathing and Patient connected to nasal cannula oxygen  Post-op Assessment: Report given to RN and Post -op Vital signs reviewed and stable  Post vital signs: Reviewed and stable  Last Vitals:  Vitals Value Taken Time  BP 144/30 08/12/2018 10:09 AM  Temp    Pulse 71 08/12/2018 10:13 AM  Resp 18 08/12/2018 10:13 AM  SpO2 100 % 08/12/2018 10:13 AM  Vitals shown include unvalidated device data.  Last Pain:  Vitals:   08/12/18 1009  TempSrc:   PainSc: 10-Worst pain ever         Complications: No apparent anesthesia complications

## 2018-08-12 NOTE — Anesthesia Postprocedure Evaluation (Signed)
Anesthesia Post Note  Patient: [name]  Procedure(s) Performed: COLONOSCOPY WITH PROPOFOL (N/A ) ESOPHAGOGASTRODUODENOSCOPY (EGD) WITH PROPOFOL (N/A ) HOT HEMOSTASIS (ARGON PLASMA COAGULATION/BICAP) (N/A )     Patient location during evaluation: PACU Anesthesia Type: MAC Level of consciousness: awake and alert Pain management: pain level controlled Vital Signs Assessment: post-procedure vital signs reviewed and stable Respiratory status: spontaneous breathing, nonlabored ventilation, respiratory function stable and patient connected to nasal cannula oxygen Cardiovascular status: stable and blood pressure returned to baseline Postop Assessment: no apparent nausea or vomiting Anesthetic complications: no    Last Vitals:  Vitals:   08/12/18 0815  BP: (!) 190/43  Pulse: 70  Resp: 13  Temp: 36.4 C  SpO2: 100%    Last Pain:  Vitals:   08/12/18 0815  TempSrc: Oral  PainSc: 0-No pain                 Brennan Bailey

## 2018-08-12 NOTE — Progress Notes (Signed)
PHARMACIST - PHYSICIAN ORDER COMMUNICATION  CONCERNING: P&T Medication Policy on Herbal Medications  DESCRIPTION:  This patient's order for:  vasculera  has been noted.  This product(s) is classified as an "herbal" or natural product. Due to a lack of definitive safety studies or FDA approval, nonstandard manufacturing practices, plus the potential risk of unknown drug-drug interactions while on inpatient medications, the Pharmacy and Therapeutics Committee does not permit the use of "herbal" or natural products of this type within [facility].   ACTION TAKEN: The pharmacy department is unable to verify this order at this time and your patient has been informed of this safety policy. Please reevaluate patient's clinical condition at discharge and address if the herbal or natural product(s) should be resumed at that time. Eudelia Bunch, Pharm.D 08/12/2018 1:47 PM

## 2018-08-12 NOTE — Interval H&P Note (Signed)
History and Physical Interval Note:  08/12/2018 8:25 AM  [name]  has presented today for surgery, with the diagnosis of rectal bleeding, hx AVM, anemia  The various methods of treatment have been discussed with the patient and family. After consideration of risks, benefits and other options for treatment, the patient has consented to  Procedure(s): COLONOSCOPY WITH PROPOFOL (N/A) ESOPHAGOGASTRODUODENOSCOPY (EGD) WITH PROPOFOL (N/A) as a surgical intervention .  The patient's history has been reviewed, patient examined, no change in status, stable for surgery.  I have reviewed the patient's chart and labs.  Questions were answered to the patient's satisfaction.     Pricilla Riffle. Fuller Plan

## 2018-08-12 NOTE — Progress Notes (Addendum)
Patient is here today for outpatient colonoscopy for evaluation of anemia, hematochezia with history of right colon and duodenal AVM's.  Has a long-standing large periumbilical hernia that apparently has been causing intermittent lower abdominal pain recently.  She tolerated bowel prep well.  Colonoscopy today was incomplete due to incarceration of the transverse colon in the hernia. Unable to reduce the hernia.  Procedure terminated.  Patient with right sided abdominal pain, tenderness and not passing any flatus post-procedure.  2 view abdominal flims did not reveal free air or bowel distention however she needs admission for further evaluation, management of abdominal pain and for surgical evaluation of the perimbulical hernia.  I have ordered a non-contrast CT scan abdomen and pelvis STAT.  I discussed with Dr. Wyonia Hough with the hospitalist team and he has agreed to admit the patient.  I discussed the plan with the patient and her family at the bedside. Surgery has been consulted and will be coming to see her .

## 2018-08-13 ENCOUNTER — Encounter (HOSPITAL_COMMUNITY): Payer: Self-pay | Admitting: Gastroenterology

## 2018-08-13 DIAGNOSIS — I130 Hypertensive heart and chronic kidney disease with heart failure and stage 1 through stage 4 chronic kidney disease, or unspecified chronic kidney disease: Secondary | ICD-10-CM | POA: Diagnosis not present

## 2018-08-13 DIAGNOSIS — R1011 Right upper quadrant pain: Secondary | ICD-10-CM | POA: Diagnosis not present

## 2018-08-13 DIAGNOSIS — I2781 Cor pulmonale (chronic): Secondary | ICD-10-CM | POA: Diagnosis not present

## 2018-08-13 DIAGNOSIS — K921 Melena: Secondary | ICD-10-CM | POA: Diagnosis not present

## 2018-08-13 DIAGNOSIS — K5190 Ulcerative colitis, unspecified, without complications: Secondary | ICD-10-CM | POA: Diagnosis not present

## 2018-08-13 DIAGNOSIS — R7303 Prediabetes: Secondary | ICD-10-CM | POA: Diagnosis not present

## 2018-08-13 DIAGNOSIS — G4733 Obstructive sleep apnea (adult) (pediatric): Secondary | ICD-10-CM | POA: Diagnosis not present

## 2018-08-13 DIAGNOSIS — Z79899 Other long term (current) drug therapy: Secondary | ICD-10-CM | POA: Diagnosis not present

## 2018-08-13 DIAGNOSIS — I4821 Permanent atrial fibrillation: Secondary | ICD-10-CM | POA: Diagnosis not present

## 2018-08-13 DIAGNOSIS — I5042 Chronic combined systolic (congestive) and diastolic (congestive) heart failure: Secondary | ICD-10-CM | POA: Diagnosis not present

## 2018-08-13 DIAGNOSIS — I4891 Unspecified atrial fibrillation: Secondary | ICD-10-CM | POA: Diagnosis not present

## 2018-08-13 DIAGNOSIS — K635 Polyp of colon: Secondary | ICD-10-CM | POA: Diagnosis not present

## 2018-08-13 DIAGNOSIS — G935 Compression of brain: Secondary | ICD-10-CM

## 2018-08-13 DIAGNOSIS — K31819 Angiodysplasia of stomach and duodenum without bleeding: Secondary | ICD-10-CM | POA: Diagnosis not present

## 2018-08-13 DIAGNOSIS — K5790 Diverticulosis of intestine, part unspecified, without perforation or abscess without bleeding: Secondary | ICD-10-CM | POA: Diagnosis not present

## 2018-08-13 DIAGNOSIS — K420 Umbilical hernia with obstruction, without gangrene: Secondary | ICD-10-CM | POA: Diagnosis not present

## 2018-08-13 DIAGNOSIS — K439 Ventral hernia without obstruction or gangrene: Secondary | ICD-10-CM | POA: Diagnosis not present

## 2018-08-13 DIAGNOSIS — M109 Gout, unspecified: Secondary | ICD-10-CM | POA: Diagnosis not present

## 2018-08-13 DIAGNOSIS — E538 Deficiency of other specified B group vitamins: Secondary | ICD-10-CM | POA: Diagnosis not present

## 2018-08-13 DIAGNOSIS — J449 Chronic obstructive pulmonary disease, unspecified: Secondary | ICD-10-CM | POA: Diagnosis not present

## 2018-08-13 DIAGNOSIS — Z87891 Personal history of nicotine dependence: Secondary | ICD-10-CM | POA: Diagnosis not present

## 2018-08-13 DIAGNOSIS — N183 Chronic kidney disease, stage 3 (moderate): Secondary | ICD-10-CM | POA: Diagnosis not present

## 2018-08-13 DIAGNOSIS — K56699 Other intestinal obstruction unspecified as to partial versus complete obstruction: Secondary | ICD-10-CM | POA: Diagnosis not present

## 2018-08-13 DIAGNOSIS — E559 Vitamin D deficiency, unspecified: Secondary | ICD-10-CM | POA: Diagnosis not present

## 2018-08-13 DIAGNOSIS — D500 Iron deficiency anemia secondary to blood loss (chronic): Secondary | ICD-10-CM | POA: Diagnosis not present

## 2018-08-13 DIAGNOSIS — K5521 Angiodysplasia of colon with hemorrhage: Secondary | ICD-10-CM | POA: Diagnosis not present

## 2018-08-13 DIAGNOSIS — K210 Gastro-esophageal reflux disease with esophagitis: Secondary | ICD-10-CM

## 2018-08-13 LAB — GLUCOSE, CAPILLARY
Glucose-Capillary: 80 mg/dL (ref 70–99)
Glucose-Capillary: 98 mg/dL (ref 70–99)
Glucose-Capillary: 103 mg/dL — ABNORMAL HIGH (ref 70–99)
Glucose-Capillary: 117 mg/dL — ABNORMAL HIGH (ref 70–99)

## 2018-08-13 LAB — BASIC METABOLIC PANEL
Anion gap: 10 (ref 5–15)
BUN: 23 mg/dL (ref 8–23)
CO2: 28 mmol/L (ref 22–32)
Calcium: 8.7 mg/dL — ABNORMAL LOW (ref 8.9–10.3)
Chloride: 103 mmol/L (ref 98–111)
Creatinine, Ser: 1.21 mg/dL — ABNORMAL HIGH (ref 0.44–1.00)
GFR calc Af Amer: 50 mL/min — ABNORMAL LOW (ref >60)
GFR calc non Af Amer: 43 mL/min — ABNORMAL LOW (ref >60)
Glucose, Bld: 94 mg/dL (ref 70–99)
POTASSIUM: 3.7 mmol/L (ref 3.5–5.1)
Sodium: 141 mmol/L (ref 135–145)

## 2018-08-13 LAB — CBC
HCT: 23.9 % — ABNORMAL LOW (ref 36.0–46.0)
Hemoglobin: 7.3 g/dL — ABNORMAL LOW (ref 12.0–15.0)
MCH: 27 pg (ref 26.0–34.0)
MCHC: 30.5 g/dL (ref 30.0–36.0)
MCV: 88.5 fL (ref 80.0–100.0)
NRBC: 0 % (ref 0.0–0.2)
Platelets: 182 10*3/uL (ref 150–400)
RBC: 2.7 MIL/uL — AB (ref 3.87–5.11)
RDW: 14.8 % (ref 11.5–15.5)
WBC: 8.2 10*3/uL (ref 4.0–10.5)

## 2018-08-13 LAB — PREPARE RBC (CROSSMATCH)

## 2018-08-13 MED ORDER — SODIUM CHLORIDE 0.9% IV SOLUTION: Freq: Once | INTRAVENOUS | Status: DC

## 2018-08-13 NOTE — Progress Notes (Addendum)
[facility] Progress Note  Subjective: pt alert, no distress, mild abd pain, no n/v/d, no CP or SOB  Vitals:   08/13/18 1435 08/13/18 1500 08/13/18 1712 08/13/18 1723  BP: 138/67 135/64 132/72 128/74  Pulse: 68 64 68 72  Resp: 18 18 16 18   Temp: 98.7 F (37.1 C) 98.6 F (37 C) 98.7 F (37.1 C) 98.6 F (37 C)  TempSrc: Oral     SpO2: 100% 98% 100% 98%  Weight:      Height:        Inpatient medications: . sodium chloride   Intravenous Once  . allopurinol  100 mg Oral Daily  . fluticasone furoate-vilanterol  1 puff Inhalation Daily  . insulin aspart  0-5 Units Subcutaneous QHS  . insulin aspart  0-9 Units Subcutaneous TID WC  . isosorbide-hydrALAZINE  1 tablet Oral TID  . metoprolol tartrate  12.5 mg Oral BID  . torsemide  40 mg Oral Daily    albuterol, bisacodyl, ondansetron **OR** ondansetron (ZOFRAN) IV, senna-docusate  Exam: Gen: alert, elderly WF, nasal O2 not in distress Eyes: PERRL, lids and conjunctivae normal ENMT: Mucous membranes are moist. Posterior pharynx clear of any exudate or lesions.Normal dentition.  Neck: normal, supple, no masses, no thyromegaly Respiratory: clear to auscultation bilaterally, no wheezing, no crackles. Normal respiratory effort. No accessory muscle use.  Cardiovascular: Regular rate and rhythm, no murmurs / rubs / gallops. No extremity edema. 2+ pedal pulses. No carotid bruits.  Abdomen: Ventral hernia, protuberant abdomen, mildly tender to palpation over hernia site, distant bowel sounds. Obese.  Musculoskeletal: no clubbing / cyanosis. No joint deformity upper and lower extremities. Good ROM, no contractures. Normal muscle tone.  Skin: no rashes, lesions, ulcers. No induration Neurologic: CN 2-12 grossly intact. Sensation intact, DTR normal. Strength 5/5 in all 4.  Psychiatric: Normal judgment and insight. Alert and oriented x 3. Normal but anxious     Presentation Summary: [name] is a 78 y.o. female with medical  history significant of prediabetes, CHF with diastolic heart failure, A. fib, obesity and prediabetes as well as known AVMs with colitis and associated anemia who was here for outpatient colonoscopy today for evaluation of anemia.  As noted by GI patient has history of right colon and duodenal AVMs and a longstanding periumbilical hernia.  Patient underwent bowel prep without complication but colonoscopy was terminated because of the inability to pass the scope through the transverse colon secondary to the chronic hernia incarceration.  Post procedure there was no evidence of perforation but because of abdominal pain/no flatus she was consulted to the hospitalist service as well as general surgery with CT scan of abdomen pelvis pending.  In the endoscopy suite patient did report bowel prep was uncomplicated ruling out obstruction but  abdominal pain persists.  General surgery was consulted by GI and is administering pain medications to attempt to reduce the hernia. ED Course: Endoscopy suite patient received fentanyl for pain, CT scan of abdomen pelvis pending, general surgery seen the patient in consultation-pending evaluation for reducibility of hernia there were no new labs CBC BMP lactic acid all pending.  Portable KUB not acute findings       Hospital Problems: Principal Problem:   Hernia, ventral Active Problems:   HTN (hypertension)   GERD   Prediabetes   CHF (congestive heart failure), NYHA class III, chronic, combined (HCC)   Atrial fibrillation (HCC)   Anemia due to chronic blood loss   Hernia cerebri (HCC)   Abdominal pain  Hospital Course:  Ventral hernia w/o obstruction or bleeding: This is a chronic known hernia.  General surgery saw in consultation to eval for reducibility. They were not able to reduce it manually.  CT results are as below.  No acute surgical needs.  They want to see patient in OP setting and discuss further options to include possible surgery. Anticipate  patient follow-up.    Prediabetes.  Cont sliding scale insulin check blood glucose before meals at bedtime, diabetic diet  CHF class III chronic combined.  Gentle hydration x 1 day 2/2 bowel prep and poor po intake, patient is not in acute exacerbation continue her home meds outpatient follow-up - cont torsemide 40 mg /d and metoprolol 12.5 bid  A. Fib: no anticoagulants due to recurrent GIB's. No changes  Anemia due to chronic blood loss:  seen by GI here. Pt will need blood transfusion today x 2units prbc's. Repeat CBC in am.  Reportedly patient having intermittent GI blood loss related to her colonic/ duodenal AVM's.  The reason for the colonoscopy was to see the AVM's and treat them as needed.  Colon procedure was unsuccessful due to colonic herniation and the AVM's were beyond the obstruction.  Per GI there is no plan for further colonoscopy until this hernia issue can be resolved. They would like PCP to follow and treat anemia/ Fe def for now in the period when Gen Surg is deciding on what to do about this hernia.  They want to proceed w/ a colonoscopy eventually after the patient has hernia surgery , if this is to be done.   - 2u prbc's to be given today - will d/w pt's PCP tomorrow regarding following anemia/ transfusing prn , etc.   Hx recurrent duodenal and R colon AVM's/ hx recurrent GIB: as above   DVT prophylaxis: scd Code Status: full Family Communication: fam members at bedside Disposition Plan: home tomorrow Consults called: gen surg by GI-PA seeing Admission status: obs med surg   Kelly Splinter MD [facility] pgr [phone] 08/13/2018, 5:44 PM    Imaging: - CT abd pelvis wo contrast 08/12/18; 1. No evidence of perforation following colonoscopy. No intraperitoneal free air or free fluid.  2. Large midline ventral hernia contains a long segment of nonobstructed transverse colon.  3. Appendix also enters the umbilical hernia without evidence of  inflammation or infection.       Recent Labs  Lab 08/12/18 1307 08/13/18 0437  NA 140 141  K 3.2* 3.7  CL 106 103  CO2 28 28  GLUCOSE 99 94  BUN 27* 23  CREATININE 1.23* 1.21*  CALCIUM 9.0 8.7*   No results for input(s): AST, ALT, ALKPHOS, BILITOT, PROT, ALBUMIN in the last 168 hours. Recent Labs  Lab 08/12/18 1307 08/13/18 0437  WBC 7.7 8.2  NEUTROABS 5.7  --   HGB 7.9* 7.3*  HCT 25.8* 23.9*  MCV 87.5 88.5  PLT 191 182   Iron/TIBC/Ferritin/ %Sat    Component Value Date/Time   IRON 30 10/17/2017 2046   IRON 71 04/11/2016 1007   TIBC 248 (L) 10/17/2017 2046   TIBC 253 04/11/2016 1007   FERRITIN 120 10/17/2017 2046   IRONPCTSAT 12 10/17/2017 2046   IRONPCTSAT 28 04/11/2016 1007

## 2018-08-13 NOTE — Progress Notes (Signed)
No acute surgical needs.  She is very high risk for surgical intervention to have this hernia repaired, but she may follow up with [place] in our office to discuss repair or further options.  Call if needed.  Henreitta Cea 7:55 AM 08/13/2018

## 2018-08-13 NOTE — Progress Notes (Signed)
Patient ok for discharge later today.  Discussed with Dr. Jonnie Finner.  No evidence of perforation or obstruction by CT scan.  Surgery not planning intervention while she is here and just plans to have her follow-up as outpatient.  Hgb trickling down still but no evidence of active/overt GI bleeding.  Obviously the AVM issue was not able to be addressed at the time of colonoscopy yesterday so this will likely continue to occur.  Dr. Jonnie Finner will transfuse with 2 units PRBC's prior to discharge today.  Will discuss follow-up with Dr. Fuller Plan but patient may very well just need period monitor of her Hgb for now and transfusions prn as outpatient.

## 2018-08-14 DIAGNOSIS — K5520 Angiodysplasia of colon without hemorrhage: Secondary | ICD-10-CM | POA: Diagnosis not present

## 2018-08-14 DIAGNOSIS — I4891 Unspecified atrial fibrillation: Secondary | ICD-10-CM | POA: Diagnosis not present

## 2018-08-14 DIAGNOSIS — I482 Chronic atrial fibrillation, unspecified: Secondary | ICD-10-CM | POA: Diagnosis not present

## 2018-08-14 DIAGNOSIS — G4733 Obstructive sleep apnea (adult) (pediatric): Secondary | ICD-10-CM | POA: Diagnosis not present

## 2018-08-14 DIAGNOSIS — R7303 Prediabetes: Secondary | ICD-10-CM | POA: Diagnosis not present

## 2018-08-14 DIAGNOSIS — K5790 Diverticulosis of intestine, part unspecified, without perforation or abscess without bleeding: Secondary | ICD-10-CM | POA: Diagnosis not present

## 2018-08-14 DIAGNOSIS — K5521 Angiodysplasia of colon with hemorrhage: Secondary | ICD-10-CM

## 2018-08-14 DIAGNOSIS — I5032 Chronic diastolic (congestive) heart failure: Secondary | ICD-10-CM | POA: Diagnosis not present

## 2018-08-14 DIAGNOSIS — J449 Chronic obstructive pulmonary disease, unspecified: Secondary | ICD-10-CM | POA: Diagnosis not present

## 2018-08-14 DIAGNOSIS — Z79899 Other long term (current) drug therapy: Secondary | ICD-10-CM | POA: Diagnosis not present

## 2018-08-14 DIAGNOSIS — N183 Chronic kidney disease, stage 3 (moderate): Secondary | ICD-10-CM | POA: Diagnosis not present

## 2018-08-14 DIAGNOSIS — E538 Deficiency of other specified B group vitamins: Secondary | ICD-10-CM | POA: Diagnosis not present

## 2018-08-14 DIAGNOSIS — K439 Ventral hernia without obstruction or gangrene: Secondary | ICD-10-CM | POA: Diagnosis not present

## 2018-08-14 DIAGNOSIS — K56699 Other intestinal obstruction unspecified as to partial versus complete obstruction: Secondary | ICD-10-CM | POA: Diagnosis not present

## 2018-08-14 DIAGNOSIS — I130 Hypertensive heart and chronic kidney disease with heart failure and stage 1 through stage 4 chronic kidney disease, or unspecified chronic kidney disease: Secondary | ICD-10-CM | POA: Diagnosis not present

## 2018-08-14 DIAGNOSIS — D500 Iron deficiency anemia secondary to blood loss (chronic): Secondary | ICD-10-CM | POA: Diagnosis not present

## 2018-08-14 DIAGNOSIS — I5042 Chronic combined systolic (congestive) and diastolic (congestive) heart failure: Secondary | ICD-10-CM | POA: Diagnosis not present

## 2018-08-14 DIAGNOSIS — E559 Vitamin D deficiency, unspecified: Secondary | ICD-10-CM | POA: Diagnosis not present

## 2018-08-14 DIAGNOSIS — K5190 Ulcerative colitis, unspecified, without complications: Secondary | ICD-10-CM | POA: Diagnosis not present

## 2018-08-14 DIAGNOSIS — K31819 Angiodysplasia of stomach and duodenum without bleeding: Secondary | ICD-10-CM | POA: Diagnosis not present

## 2018-08-14 DIAGNOSIS — K420 Umbilical hernia with obstruction, without gangrene: Secondary | ICD-10-CM | POA: Diagnosis not present

## 2018-08-14 DIAGNOSIS — K921 Melena: Secondary | ICD-10-CM | POA: Diagnosis not present

## 2018-08-14 DIAGNOSIS — K635 Polyp of colon: Secondary | ICD-10-CM | POA: Diagnosis not present

## 2018-08-14 DIAGNOSIS — M109 Gout, unspecified: Secondary | ICD-10-CM | POA: Diagnosis not present

## 2018-08-14 DIAGNOSIS — Z87891 Personal history of nicotine dependence: Secondary | ICD-10-CM | POA: Diagnosis not present

## 2018-08-14 DIAGNOSIS — I2781 Cor pulmonale (chronic): Secondary | ICD-10-CM | POA: Diagnosis not present

## 2018-08-14 LAB — TYPE AND SCREEN
ABO/RH(D): O POS
Antibody Screen: POSITIVE
DONOR AG TYPE: NEGATIVE
Donor AG Type: NEGATIVE
Unit division: 0
Unit division: 0

## 2018-08-14 LAB — BASIC METABOLIC PANEL
Anion gap: 10 (ref 5–15)
BUN: 27 mg/dL — ABNORMAL HIGH (ref 8–23)
CO2: 27 mmol/L (ref 22–32)
Calcium: 8.8 mg/dL — ABNORMAL LOW (ref 8.9–10.3)
Chloride: 104 mmol/L (ref 98–111)
Creatinine, Ser: 1.41 mg/dL — ABNORMAL HIGH (ref 0.44–1.00)
GFR calc Af Amer: 41 mL/min — ABNORMAL LOW (ref >60)
GFR calc non Af Amer: 36 mL/min — ABNORMAL LOW (ref >60)
Glucose, Bld: 102 mg/dL — ABNORMAL HIGH (ref 70–99)
Potassium: 3.7 mmol/L (ref 3.5–5.1)
Sodium: 141 mmol/L (ref 135–145)

## 2018-08-14 LAB — CBC
HCT: 31.8 % — ABNORMAL LOW (ref 36.0–46.0)
Hemoglobin: 9.8 g/dL — ABNORMAL LOW (ref 12.0–15.0)
MCH: 27 pg (ref 26.0–34.0)
MCHC: 30.8 g/dL (ref 30.0–36.0)
MCV: 87.6 fL (ref 80.0–100.0)
NRBC: 0 % (ref 0.0–0.2)
Platelets: 184 10*3/uL (ref 150–400)
RBC: 3.63 MIL/uL — AB (ref 3.87–5.11)
RDW: 14.6 % (ref 11.5–15.5)
WBC: 7.8 10*3/uL (ref 4.0–10.5)

## 2018-08-14 LAB — BPAM RBC
Blood Product Expiration Date: 202002102359
Blood Product Expiration Date: 202002172359
ISSUE DATE / TIME: 202001151728
ISSUE DATE / TIME: 202001151442
Unit Type and Rh: 5100
Unit Type and Rh: 5100

## 2018-08-14 LAB — IRON AND TIBC
Iron: 73 ug/dL (ref 28–170)
Saturation Ratios: 26 % (ref 10.4–31.8)
TIBC: 277 ug/dL (ref 250–450)
UIBC: 204 ug/dL

## 2018-08-14 LAB — GLUCOSE, CAPILLARY
GLUCOSE-CAPILLARY: 88 mg/dL (ref 70–99)
Glucose-Capillary: 101 mg/dL — ABNORMAL HIGH (ref 70–99)

## 2018-08-14 NOTE — Discharge Summary (Signed)
Physician Discharge Summary  Patient ID: [name] MRN: [id number] DOB/AGE: 04-18-1941 78 y.o.  Admit date: 08/12/2018 Discharge date: 08/14/2018  Admission Diagnoses: Principal Problem:   Anemia due to chronic blood loss Active Problems:   HTN (hypertension)   CHF (congestive heart failure), NYHA class III, chronic, diastolic (HCC)   Hernia, ventral   Morbid obesity (HCC)   Atrial fibrillation (HCC)   Abdominal pain   AVM (arteriovenous malformation) of colon   GERD   ULCERATIVE COLITIS-LEFT SIDE   Prediabetes   Hernia cerebri ([place])   Discharge Diagnoses:  Principal Problem:   Anemia due to chronic blood loss Active Problems:   HTN (hypertension)   CHF (congestive heart failure), NYHA class III, chronic, diastolic (HCC)   Hernia, ventral   Morbid obesity (HCC)   Atrial fibrillation (HCC)   Abdominal pain   AVM (arteriovenous malformation) of colon   GERD   ULCERATIVE COLITIS-LEFT SIDE   Prediabetes   Hernia cerebri ([place])   Discharged Condition: fair  Presentation Summary: [name] a 78 y.o.femalewith medical history significant ofprediabetes, CHF with diastolic heart failure, A. fib, obesity and prediabetes as well as known AVMswith colitis and associated anemia who was here for outpatient colonoscopy today for evaluation of anemia. As noted by GI patient has history of right colon andduodenal AVMs and a longstanding periumbilical hernia. Patient underwent bowel prep without complication butcolonoscopy was terminated because of the inability to pass the scope through the transverse colon secondary tothe chronic hernia incarceration.Post procedure there was no evidence of perforation but because of abdominal pain/no flatusshe was consulted to the hospitalist service as well as general surgery with CT scan of abdomen pelvis pending. In the endoscopy suite patient did report bowel prep was uncomplicated ruling out obstruction butabdominal pain  persists. General surgery was consulted by GI and isadministering pain medications to attempt to reduce the hernia. ED Course:Endoscopy suite patient received fentanyl for pain, CT scan of abdomen pelvis pending, general surgery seen the patient in consultation-pending evaluation for reducibility of hernia there were no new labs CBC BMP lactic acid all pending. Portable KUB not acute findings    Hospital Course:  Ventral hernia w/o obstruction or bleeding: This is a chronic known hernia. Pt states she was told a couple yrs ago that it could not be repaired unless she lost weight. Now Gen Surg saw her here and said very high risk for surgery 2/2 mult comorbidities and not acute indication.  They said they would see her in the office to discuss options further and that she would need to have clearance from the heart and lung doctors before any surgery.  - pt states not sure if she will f/u w/ surgery in OP setting, she is thinking about it    Prediabetes. Cont sliding scale insulin check blood glucose before meals at bedtime, diabetic diet  CHF class III chronic combined: no issues in hospital.  - cont torsemide 40 mg /d and metoprolol 12.5 bid  A. Fib: no anticoagulants due to recurrent GIB's. No changes  Anemia due to chronic blood loss/ Hx recurrent duodenal and R colon AVM's w chronic GI blood loss: as above seen by GI here. Colon procedure was unsuccessful due to colonic herniation and the AVM's were beyond the obstruction.  Per GI there is no plan for further colonoscopy until if/when the hernia issue could be resolved.   - patient got 2u prbc's here w/ discharge Hb up to 9.8 - GI asked for PCP  to follow anemia, transfuse prn to keep Hb> 8 and give IV Fe / po Fe as needed. Have d/w pt's PCP Dr Ronnald Ramp.    Prognosis: spoke w/ family /grand-daughter about pt's prognosis which may not be good overall given multiple comorbidities. She stated she understands and is talking w/ her  grandmother about this.       Imaging: CT abd pelvis wo contrast 08/12/18; 1. No evidence of perforation following colonoscopy. No intraperitoneal free air or free fluid.  2. Large midline ventral hernia contains a long segment of nonobstructed transverse colon.  3. Appendix also enters the umbilical hernia without evidence of inflammation or infection.     Discharge Exam: Blood pressure (!) 169/49, pulse (!) 59, temperature 98.7 F (37.1 C), temperature source Oral, resp. rate 18, height 5' (1.524 m), weight 91.7 kg, SpO2 98 %. Gen: alert, elderly WF, nasal O2 not in distress Eyes:PERRL, lids and conjunctivae normal ENMT:Mucous membranes are moist. Posterior pharynx clear of any exudate or lesions.Normal dentition.  Neck:normal, supple, no masses, no thyromegaly Respiratory:clear to auscultation bilaterally, no wheezing, no crackles. Normal respiratory effort. No accessory muscle use.  Cardiovascular:Regular rate and rhythm, no murmurs / rubs / gallops. No extremity edema. 2+ pedal pulses. No carotid bruits.  Abdomen:Ventral hernia, protuberant abdomen, mildly tender to palpation over hernia site, distant bowel sounds. Obese.  Musculoskeletal:no clubbing / cyanosis. No joint deformity upper and lower extremities. Good ROM, no contractures. Normal muscle tone.  Skin:no rashes, lesions, ulcers. No induration Neurologic:CN 2-12 grossly intact. Sensation intact, DTR normal. Strength 5/5 in all 4.  Psychiatric:Normal judgment and insight. Alert and oriented x 3. Normal but anxious  Disposition: Discharge disposition: 01-Home or Self Care        Allergies as of 08/14/2018      Reactions   Amoxicillin Diarrhea   DID THE REACTION INVOLVE: Swelling of the face/tongue/throat, SOB, or low BP? N Sudden or severe rash/hives, skin peeling, or the inside of the mouth or nose? N Did it require medical treatment? N When did it last happen?MORE THAN 10 YEARS If all above  answers are "NO", may proceed with cephalosporin use.   Celebrex [celecoxib]    edema   Enalapril Cough   Lipitor [atorvastatin]    Muscle aches   Amlodipine Besylate    REACTION: edema   Codeine Sulfate    REACTION: Nausea   Hydrocodone-acetaminophen    REACTION: Nausea      Medication List    TAKE these medications   acetaminophen 325 MG tablet Commonly known as:  TYLENOL Take 325-650 mg by mouth daily as needed for moderate pain, fever or headache.   albuterol 108 (90 Base) MCG/ACT inhaler Commonly known as:  PROAIR HFA Inhale 1-2 puffs into the lungs every 6 (six) hours as needed for wheezing or shortness of breath.   allopurinol 100 MG tablet Commonly known as:  ZYLOPRIM TAKE 1 TABLET BY MOUTH EVERY DAY FOR GOUT What changed:  See the new instructions.   BIDIL 20-37.5 MG tablet Generic drug:  isosorbide-hydrALAZINE TAKE 1 TABLET BY MOUTH THREE TIMES DAILY   cyanocobalamin 2000 MCG tablet Take 1 tablet (2,000 mcg total) by mouth daily. What changed:  when to take this   ferrous sulfate 325 (65 FE) MG tablet Take 1 tablet (325 mg total) by mouth 2 (two) times daily with a meal.   fluticasone furoate-vilanterol 100-25 MCG/INH Aepb Commonly known as:  BREO ELLIPTA Inhale 1 puff into the lungs daily.   metoprolol tartrate  25 MG tablet Commonly known as:  LOPRESSOR Take 0.5 tablets (12.5 mg total) by mouth 2 (two) times daily. What changed:  Another medication with the same name was removed. Continue taking this medication, and follow the directions you see here.   torsemide 20 MG tablet Commonly known as:  DEMADEX TAKE 1 TABLET(20 MG) BY MOUTH TWICE DAILY What changed:  See the new instructions.   VASCULERA Tabs TAKE ONE TABLET BY MOUTH EVERY DAY   Vitamin D (Ergocalciferol) 1.25 MG (50000 UT) Caps capsule Commonly known as:  DRISDOL TAKE 50,000 CAPSULE BY MOUTH ONCE WEEKLY      Follow-up Information    [facility] Follow up.    Specialty:  General Surgery Why:  You can call for follow up for your hernia when you are ready.  You will need to have clearance from the heart and lung doctors before you have any surgery.  Contact information: [address] [place]  [id number] [phone]           Signed: Sol Blazing 08/14/2018, 10:19 AM

## 2018-08-15 ENCOUNTER — Telehealth: Payer: Self-pay | Admitting: *Deleted

## 2018-08-15 NOTE — Telephone Encounter (Signed)
Transition Care Management Follow-up Telephone Call   Date discharged? 08/14/18   How have you been since you were released from the hospital? Pt states she's doing alright   Do you understand why you were in the hospital? YES   Do you understand the discharge instructions? YES   Where were you discharged to? Home   Items Reviewed:  Medications reviewed: YES  Allergies reviewed: YES  Dietary changes reviewed: YES, heart healthy  Referrals reviewed: No referral needed   Functional Questionnaire:   Activities of Daily Living (ADLs):   She states she are independent in the following: bathing and hygiene, feeding, continence, grooming and toileting States she require assistance with the following: ambulation and dressing   Any transportation issues/concerns?: NO   Any patient concerns? NO   Confirmed importance and date/time of follow-up visits scheduled YES, appt 08/27/18  Provider Appointment booked with Dr. Ronnald Ramp  Confirmed with patient if condition begins to worsen call PCP or go to the ER.  Patient was given the office number and encouraged to call back with question or concerns.  : YES

## 2018-08-20 ENCOUNTER — Telehealth: Payer: Self-pay

## 2018-08-20 NOTE — Telephone Encounter (Signed)
Dr Fuller Plan and Janett Billow,   I spoke with the [name] to schedule her for a follow up to discuss colonoscopy; I asked her if she had her hernia surgery and she states her doctors told her she was "too risky" for surgery. Do you want her to follow up with you in the office now?

## 2018-08-20 NOTE — Telephone Encounter (Signed)
Left message on machine to call back  

## 2018-08-20 NOTE — Telephone Encounter (Signed)
-----   Message from Timothy Lasso, RN sent at 08/13/2018  2:23 PM EST ----- From: Ladene Artist, MD  Sent: 08/13/2018 12:55 PM EST  To: Marlon Pel, RN, Laban Emperor Zehr, PA-C   Jess,   [facility] follow up for hernia repair  PCP for anemia mgmt with PO Fe, IV Fe, transfusions as needed  GI follow up after recovery from hernia repair to consider colonoscopy   MS   Pt being admitted today--need to make sure pt has follow up appt after surgery appt.

## 2018-08-20 NOTE — Telephone Encounter (Signed)
See my note from 08/13/2018. She does not need GI follow up. She needs PCP follow up. If her hernia symptoms worsen then follow up with [facility] again.   Chronic GI blood loss from cecal AVMs should be managed by her PCP with PO Fe, IV Fe, transfusions as necessary. Due to her hernia we are unable to perform colonoscopy proximal to the transverse colon so no endoscopy therapy is available to her.

## 2018-08-22 ENCOUNTER — Telehealth: Payer: Self-pay | Admitting: *Deleted

## 2018-08-22 NOTE — Telephone Encounter (Signed)
ERROR

## 2018-08-22 NOTE — Telephone Encounter (Signed)
-----   Message from [facility], [place] sent at 08/20/2018  6:47 AM EST ----- Regarding: RE: [place], please just chart that we will leave her Bidil as written, not change to the 2 separate generic components.  Thanks renee ----- Message ----- From: Claude Manges, CMA Sent: 08/14/2018   8:54 AM EST To: Baldwin Jamaica, PA-C Subject: Vivian                  ----- Message ----- From: Baldwin Jamaica, PA-C Sent: 08/13/2018   3:50 PM EST To: Claude Manges, CMA  Did this patient ever decide to change Bidil ?  Were you ever able to catch up with her?  I think it is best just to leave the Bidil rather then the 2 separate generic medicines, this would be several more pills per day  Let me know  Thanks renee

## 2018-08-22 NOTE — Telephone Encounter (Signed)
Per Charlcie Cradle  Pt will remain on Bidil dose and not switch to 2 generics.

## 2018-08-24 DIAGNOSIS — J449 Chronic obstructive pulmonary disease, unspecified: Secondary | ICD-10-CM | POA: Diagnosis not present

## 2018-08-27 ENCOUNTER — Ambulatory Visit (INDEPENDENT_AMBULATORY_CARE_PROVIDER_SITE_OTHER): Payer: Medicare Other | Admitting: Internal Medicine

## 2018-08-27 ENCOUNTER — Other Ambulatory Visit (INDEPENDENT_AMBULATORY_CARE_PROVIDER_SITE_OTHER): Payer: Medicare Other

## 2018-08-27 ENCOUNTER — Encounter: Payer: Self-pay | Admitting: Internal Medicine

## 2018-08-27 VITALS — BP 160/74 | HR 59 | Temp 97.9°F | Resp 16 | Ht 60.0 in | Wt 206.2 lb

## 2018-08-27 DIAGNOSIS — K420 Umbilical hernia with obstruction, without gangrene: Secondary | ICD-10-CM | POA: Diagnosis not present

## 2018-08-27 DIAGNOSIS — N183 Chronic kidney disease, stage 3 unspecified: Secondary | ICD-10-CM

## 2018-08-27 DIAGNOSIS — I480 Paroxysmal atrial fibrillation: Secondary | ICD-10-CM | POA: Diagnosis not present

## 2018-08-27 DIAGNOSIS — D510 Vitamin B12 deficiency anemia due to intrinsic factor deficiency: Secondary | ICD-10-CM

## 2018-08-27 DIAGNOSIS — E785 Hyperlipidemia, unspecified: Secondary | ICD-10-CM

## 2018-08-27 DIAGNOSIS — D500 Iron deficiency anemia secondary to blood loss (chronic): Secondary | ICD-10-CM

## 2018-08-27 LAB — CBC WITH DIFFERENTIAL/PLATELET
BASOS PCT: 0.6 % (ref 0.0–3.0)
Basophils Absolute: 0.1 10*3/uL (ref 0.0–0.1)
Eosinophils Absolute: 0.3 10*3/uL (ref 0.0–0.7)
Eosinophils Relative: 2.8 % (ref 0.0–5.0)
HCT: 30.2 % — ABNORMAL LOW (ref 36.0–46.0)
Hemoglobin: 9.8 g/dL — ABNORMAL LOW (ref 12.0–15.0)
Lymphocytes Relative: 13.7 % (ref 12.0–46.0)
Lymphs Abs: 1.3 10*3/uL (ref 0.7–4.0)
MCHC: 32.3 g/dL (ref 30.0–36.0)
MCV: 82.7 fl (ref 78.0–100.0)
Monocytes Absolute: 0.9 10*3/uL (ref 0.1–1.0)
Monocytes Relative: 9.4 % (ref 3.0–12.0)
Neutro Abs: 6.9 10*3/uL (ref 1.4–7.7)
Neutrophils Relative %: 73.5 % (ref 43.0–77.0)
PLATELETS: 221 10*3/uL (ref 150.0–400.0)
RBC: 3.66 Mil/uL — ABNORMAL LOW (ref 3.87–5.11)
RDW: 14.8 % (ref 11.5–15.5)
WBC: 9.4 10*3/uL (ref 4.0–10.5)

## 2018-08-27 LAB — IBC PANEL
Iron: 95 ug/dL (ref 42–145)
Saturation Ratios: 30.4 % (ref 20.0–50.0)
Transferrin: 223 mg/dL (ref 212.0–360.0)

## 2018-08-27 LAB — VITAMIN B12: Vitamin B-12: >1525 pg/mL — ABNORMAL HIGH (ref 211–911)

## 2018-08-27 LAB — FERRITIN: Ferritin: 61 ng/mL (ref 10.0–291.0)

## 2018-08-27 LAB — FOLATE: Folate: 7.1 ng/mL (ref >5.9)

## 2018-08-27 NOTE — Progress Notes (Signed)
Subjective:  Patient ID: [name], female    DOB: February 08, 1941  Age: 78 y.o. MRN: [id number]  CC: Anemia and Hypertension   HPI TORRIN FREIN presents for hosp f/up - She was admitted 2 weeks ago for a lower GI bleed that required transfusion.  A colonoscopy was attempted but had to be aborted because there was an umbilical hernia that obstructed passage of the scope.  It was felt that her lower GI bleed was from diverticular disease.  She continues to have intermittent discomfort in the hernia.  She was seen by surgery and told that she was too high risk to have the hernia repaired.  She was asked to follow-up with general surgery but she has decided not to do that since she is not willing to have hernia surgery.  Since discharge she has had a good appetite and has not noticed any blood in her stools.  Admit date: 08/12/2018 Discharge date: 08/14/2018  Admission Diagnoses: Principal Problem:   Anemia due to chronic blood loss Active Problems:   HTN (hypertension)   CHF (congestive heart failure), NYHA class III, chronic, diastolic (HCC)   Hernia, ventral   Morbid obesity (HCC)   Atrial fibrillation (HCC)   Abdominal pain   AVM (arteriovenous malformation) of colon   GERD   ULCERATIVE COLITIS-LEFT SIDE   Prediabetes   Hernia cerebri ([place])  Outpatient Medications Prior to Visit  Medication Sig Dispense Refill  . acetaminophen (TYLENOL) 325 MG tablet Take 325-650 mg by mouth daily as needed for moderate pain, fever or headache.     . albuterol (PROAIR HFA) 108 (90 Base) MCG/ACT inhaler Inhale 1-2 puffs into the lungs every 6 (six) hours as needed for wheezing or shortness of breath. 18 g 11  . allopurinol (ZYLOPRIM) 100 MG tablet TAKE 1 TABLET BY MOUTH EVERY DAY FOR GOUT (Patient taking differently: Take 100 mg by mouth daily. TAKE 1 TABLET BY MOUTH EVERY DAY FOR GOUT) 90 tablet 0  . BIDIL 20-37.5 MG tablet TAKE 1 TABLET BY MOUTH THREE TIMES DAILY 270 tablet 0  .  cyanocobalamin 2000 MCG tablet Take 1 tablet (2,000 mcg total) by mouth daily. (Patient taking differently: Take 2,000 mcg by mouth every evening. ) 90 tablet 3  . Dietary Management Product (VASCULERA) TABS TAKE ONE TABLET BY MOUTH EVERY DAY 90 tablet 1  . ferrous sulfate 325 (65 FE) MG tablet Take 1 tablet (325 mg total) by mouth 2 (two) times daily with a meal. 180 tablet 1  . fluticasone furoate-vilanterol (BREO ELLIPTA) 100-25 MCG/INH AEPB Inhale 1 puff into the lungs daily. 60 each 11  . torsemide (DEMADEX) 20 MG tablet TAKE 1 TABLET(20 MG) BY MOUTH TWICE DAILY (Patient taking differently: Take 40 mg by mouth daily. ) 180 tablet 0  . Vitamin D, Ergocalciferol, (DRISDOL) 50000 units CAPS capsule TAKE 50,000 CAPSULE BY MOUTH ONCE WEEKLY 12 capsule 1  . metoprolol tartrate (LOPRESSOR) 25 MG tablet Take 0.5 tablets (12.5 mg total) by mouth 2 (two) times daily. 90 tablet 3   No facility-administered medications prior to visit.     ROS Review of Systems  Constitutional: Positive for fatigue and unexpected weight change (wt gain). Negative for activity change, appetite change, chills, diaphoresis and fever.  HENT: Negative.  Negative for sore throat and trouble swallowing.   Respiratory: Positive for shortness of breath. Negative for chest tightness and wheezing.   Cardiovascular: Negative for chest pain, palpitations and leg swelling.  Gastrointestinal: Positive for abdominal pain  and constipation. Negative for anal bleeding, blood in stool, diarrhea, nausea, rectal pain and vomiting.  Genitourinary: Negative.  Negative for difficulty urinating, dysuria and hematuria.  Musculoskeletal: Negative.   Skin: Negative.  Negative for color change and pallor.  Neurological: Positive for dizziness and weakness. Negative for light-headedness and numbness.  Hematological: Negative for adenopathy. Does not bruise/bleed easily.  Psychiatric/Behavioral: Negative.     Objective:  BP (!) 160/74 (BP  Location: Left Arm, Patient Position: Sitting, Cuff Size: Large)   Pulse (!) 59   Temp 97.9 F (36.6 C) (Oral)   Resp 16   Ht 5' (1.524 m)   Wt 206 lb 4 oz (93.6 kg)   SpO2 95%   BMI 40.28 kg/m   BP Readings from Last 3 Encounters:  08/27/18 (!) 160/74  08/14/18 (!) 169/49  08/06/18 (!) 142/45    Wt Readings from Last 3 Encounters:  08/27/18 206 lb 4 oz (93.6 kg)  08/12/18 202 lb 3.2 oz (91.7 kg)  08/08/18 202 lb 3.2 oz (91.7 kg)    Physical Exam Constitutional:      Appearance: She is obese. She is not ill-appearing or diaphoretic.  HENT:     Nose: No congestion or rhinorrhea.     Mouth/Throat:     Mouth: Mucous membranes are moist.     Pharynx: Oropharynx is clear. No oropharyngeal exudate or posterior oropharyngeal erythema.  Eyes:     General: No scleral icterus.    Conjunctiva/sclera: Conjunctivae normal.  Neck:     Musculoskeletal: Normal range of motion and neck supple. No muscular tenderness.  Cardiovascular:     Rate and Rhythm: Normal rate and regular rhythm.     Heart sounds: Murmur present. Systolic murmur present with a grade of 1/6. No diastolic murmur. No friction rub. No gallop.   Pulmonary:     Effort: Pulmonary effort is normal.     Breath sounds: Normal breath sounds. No stridor. No wheezing, rhonchi or rales.  Abdominal:     General: Abdomen is protuberant. There is no distension.     Palpations: There is no hepatomegaly, splenomegaly or mass.     Tenderness: There is abdominal tenderness in the periumbilical area.     Hernia: A hernia is present. Hernia is present in the umbilical area.  Musculoskeletal:     Right lower leg: 1+ Pitting Edema present.     Left lower leg: 1+ Pitting Edema present.  Lymphadenopathy:     Cervical: No cervical adenopathy.  Skin:    General: Skin is warm and dry.     Findings: No erythema.  Neurological:     General: No focal deficit present.     Mental Status: She is oriented to person, place, and time. Mental  status is at baseline.     Lab Results  Component Value Date   WBC 9.4 08/27/2018   HGB 9.8 (L) 08/27/2018   HCT 30.2 (L) 08/27/2018   PLT 221.0 08/27/2018   GLUCOSE 102 (H) 08/14/2018   CHOL 151 02/04/2018   TRIG 85.0 02/04/2018   HDL 56.30 02/04/2018   LDLDIRECT 107.0 09/04/2016   LDLCALC 78 02/04/2018   ALT 10 08/04/2018   AST 16 08/04/2018   NA 141 08/14/2018   K 3.7 08/14/2018   CL 104 08/14/2018   CREATININE 1.41 (H) 08/14/2018   BUN 27 (H) 08/14/2018   CO2 27 08/14/2018   TSH 3.276 10/17/2017   INR 1.07 08/08/2017   HGBA1C 5.4 11/05/2017   MICROALBUR <0.7  11/05/2017    Ct Abdomen Pelvis Wo Contrast  Result Date: 08/12/2018 CLINICAL DATA:  Incomplete colonoscopy. Question umbilical hernia. Exclude perforation. EXAM: CT ABDOMEN AND PELVIS WITHOUT CONTRAST TECHNIQUE: Multidetector CT imaging of the abdomen and pelvis was performed following the standard protocol without IV contrast. COMPARISON:  CT 03/19/2016 FINDINGS: Lower chest: Mild basilar atelectasis. Hepatobiliary: No focal hepatic lesion. Small gallstones noted. No duct dilatation. Pancreas: Pancreas is normal. No ductal dilatation. No pancreatic inflammation. Spleen: Normal spleen Adrenals/urinary tract: Adrenal glands normal. Bilateral mixed density renal cystic lesions can not be fully characterized without IV contrast no ureteral obstruction. Bladder normal. Stomach/Bowel: Stomach, small-bowel cecum normal. Terminal ileum normal. Ascending colon normal. A 10 cm segment of the transverse colon enters the umbilical hernia. There is no caliber change entering or exiting the hernia. No evidence obstruction. The descending colon and rectum are normal. No evidence of perforation or free air. Interestingly, the appendix enters the umbilical hernia sac. No evidence of inflammation (image 52/2) Vascular/Lymphatic: Abdominal aorta is normal caliber with atherosclerotic calcification. There is no retroperitoneal or periportal  lymphadenopathy. No pelvic lymphadenopathy. Reproductive: Uterus and ovaries normal. Other: No free fluid Musculoskeletal: No aggressive osseous lesion. IMPRESSION: 1. No evidence of perforation following colonoscopy. No intraperitoneal free air or free fluid. 2. Large midline ventral hernia contains a long segment of nonobstructed transverse colon. 3. Appendix also enters the umbilical hernia without evidence of inflammation or infection. 4. Bilateral mixed density renal cysts are favored benign. 5.  Aortic Atherosclerosis (ICD10-I70.0). Electronically Signed   By: Suzy Bouchard M.D.   On: 08/12/2018 16:08   Dg Abd Portable 2v  Result Date: 08/12/2018 CLINICAL DATA:  Generalized abdominal pain after colonoscopy which was performed earlier today. EXAM: PORTABLE ABDOMEN - 2 VIEW COMPARISON:  CT abdomen and pelvis 03/19/2016. Acute abdomen series 04/22/2014. FINDINGS: Bowel gas pattern unremarkable without evidence of obstruction or significant ileus. No evidence of free air or significant air-fluid levels on the lateral decubitus image. Expected stool burden in the colon. No visible opaque urinary tract calculi. Aortoiliac atherosclerosis without evidence of aneurysm. Degenerative changes involving the lower thoracic and lumbar spine. IMPRESSION: No acute abdominal abnormality. Electronically Signed   By: Evangeline Dakin M.D.   On: 08/12/2018 10:53    Assessment & Plan:   Mycala was seen today for anemia and hypertension.  Diagnoses and all orders for this visit:  CKD (chronic kidney disease), stage III ([place])- Her creatinine clearance is at 33.6 mL/min.  She will avoid nephrotoxic agents.  Iron deficiency anemia due to chronic blood loss- Her H&H is unchanged.  Her iron level is normal.  It appears that the  main cause for her anemia is renal insufficiency and the anemia of chronic disease. -     CBC with Differential/Platelet; Future -     IBC panel; Future -     Ferritin; Future  Vitamin B12  deficiency anemia due to intrinsic factor deficiency-B12 level is actually high. -     CBC with Differential/Platelet; Future -     Vitamin B12; Future -     Folate; Future  Umbilical hernia with obstruction-she will see general surgery if she changes her mind about having surgical intervention on this.  Paroxysmal atrial fibrillation (HCC)-she is maintaining sinus rhythm.  Anticoagulation is contraindicated.  Hyperlipidemia with target LDL less than 100- Her ASCVD risk score is 56.5%.  I had recommended that she take a statin but she tells me she recently saw a PA in cardiology who  told her that her cholesterol level was normal and she did not need a statin so she subsequently decided to stop taking a statin.   I am having [name] maintain her cyanocobalamin, albuterol, acetaminophen, ferrous sulfate, Vitamin D (Ergocalciferol), metoprolol tartrate, BIDIL, fluticasone furoate-vilanterol, allopurinol, VASCULERA, and torsemide.  No orders of the defined types were placed in this encounter.    Follow-up: No follow-ups on file.  [name], MD

## 2018-08-28 ENCOUNTER — Encounter: Payer: Self-pay | Admitting: Internal Medicine

## 2018-08-28 NOTE — Patient Instructions (Signed)
Anemia  Anemia is a condition in which you do not have enough red blood cells or hemoglobin. Hemoglobin is a substance in red blood cells that carries oxygen. When you do not have enough red blood cells or hemoglobin (are anemic), your body cannot get enough oxygen and your organs may not work properly. As a result, you may feel very tired or have other problems. What are the causes? Common causes of anemia include:  Excessive bleeding. Anemia can be caused by excessive bleeding inside or outside the body, including bleeding from the intestine or from periods in women.  Poor nutrition.  Long-lasting (chronic) kidney, thyroid, and liver disease.  Bone marrow disorders.  Cancer and treatments for cancer.  HIV (human immunodeficiency virus) and AIDS (acquired immunodeficiency syndrome).  Treatments for HIV and AIDS.  Spleen problems.  Blood disorders.  Infections, medicines, and autoimmune disorders that destroy red blood cells. What are the signs or symptoms? Symptoms of this condition include:  Minor weakness.  Dizziness.  Headache.  Feeling heartbeats that are irregular or faster than normal (palpitations).  Shortness of breath, especially with exercise.  Paleness.  Cold sensitivity.  Indigestion.  Nausea.  Difficulty sleeping.  Difficulty concentrating. Symptoms may occur suddenly or develop slowly. If your anemia is mild, you may not have symptoms. How is this diagnosed? This condition is diagnosed based on:  Blood tests.  Your medical history.  A physical exam.  Bone marrow biopsy. Your health care provider may also check your stool (feces) for blood and may do additional testing to look for the cause of your bleeding. You may also have other tests, including:  Imaging tests, such as a CT scan or MRI.  Endoscopy.  Colonoscopy. How is this treated? Treatment for this condition depends on the cause. If you continue to lose a lot of blood, you may  need to be treated at a hospital. Treatment may include:  Taking supplements of iron, vitamin S31, or folic acid.  Taking a hormone medicine (erythropoietin) that can help to stimulate red blood cell growth.  Having a blood transfusion. This may be needed if you lose a lot of blood.  Making changes to your diet.  Having surgery to remove your spleen. Follow these instructions at home:  Take over-the-counter and prescription medicines only as told by your health care provider.  Take supplements only as told by your health care provider.  Follow any diet instructions that you were given.  Keep all follow-up visits as told by your health care provider. This is important. Contact a health care provider if:  You develop new bleeding anywhere in the body. Get help right away if:  You are very weak.  You are short of breath.  You have pain in your abdomen or chest.  You are dizzy or feel faint.  You have trouble concentrating.  You have bloody or black, tarry stools.  You vomit repeatedly or you vomit up blood. Summary  Anemia is a condition in which you do not have enough red blood cells or enough of a substance in your red blood cells that carries oxygen (hemoglobin).  Symptoms may occur suddenly or develop slowly.  If your anemia is mild, you may not have symptoms.  This condition is diagnosed with blood tests as well as a medical history and physical exam. Other tests may be needed.  Treatment for this condition depends on the cause of the anemia. This information is not intended to replace advice given to you by  your health care provider. Make sure you discuss any questions you have with your health care provider. Document Released: 08/23/2004 Document Revised: 08/17/2016 Document Reviewed: 08/17/2016 Elsevier Interactive Patient Education  2019 [employer].

## 2018-09-03 ENCOUNTER — Other Ambulatory Visit: Payer: Self-pay | Admitting: Internal Medicine

## 2018-09-11 ENCOUNTER — Other Ambulatory Visit: Payer: Self-pay | Admitting: Internal Medicine

## 2018-09-14 ENCOUNTER — Other Ambulatory Visit: Payer: Self-pay | Admitting: Internal Medicine

## 2018-09-18 ENCOUNTER — Ambulatory Visit: Payer: Self-pay

## 2018-09-18 NOTE — Telephone Encounter (Signed)
Pt. Reports she started having blurred/double vision  Yesterday.If she looks through the right eye only, it seems clear. Can see the TV. Headache on and off. BP last night 158/64.Does wear reading glasses. Instructed pt. She needs to be evaluated today. Refuses appointment or ED visit because of possible snow today. States she just "wants to let the doctor know and see what he thinks." Instructed if symptoms worsen to call 911 - verbalizes understanding. Please advise pt.  Reason for Disposition . [1] Headache AND [2] age > 80  Answer Assessment - Initial Assessment Questions 1. DESCRIPTION: "What is the vision loss like? Describe it for me." (e.g., complete vision loss, blurred vision, double vision, floaters, etc.)     Double vision or blurred vision 2. LOCATION: "One or both eyes?" If one, ask: "Which eye?"     Can see clear through right 3. SEVERITY: "Can you see anything?" If so, ask: "What can you see?" (e.g., fine print)     Can see TV 4. ONSET: "When did this begin?" "Did it start suddenly or has this been gradual?"     yESTERDAY 5. PATTERN: "Does this come and go, or has it been constant since it started?"     Constant 6. PAIN: "Is there any pain in your eye(s)?"  (Scale 1-10; or mild, moderate, severe)     4 7. CONTACTS-GLASSES: "Do you wear contacts or glasses?"     Glasses 8. CAUSE: "What do you think is causing this visual problem?"      Unsure 9. OTHER SYMPTOMS: "Do you have any other symptoms?" (e.g., confusion, headache, arm or leg weakness, speech problems)     Headache  bp 158/64 10. PREGNANCY: "Is there any chance you are pregnant?" "When was your last menstrual period?"       No  Protocols used: [place]

## 2018-09-20 NOTE — Telephone Encounter (Signed)
If her symptoms persist then she will need to be seen  [place]

## 2018-09-22 NOTE — Telephone Encounter (Signed)
Spoke to pt and she is still not feeling any better. Pt stated that she has an appt on Tuesday with an ophthalmology.  I also have pt scheduled to see PCP on Wednesday and informed that if the eye dr took care of everything then she could cancel the appt.

## 2018-09-23 DIAGNOSIS — H25813 Combined forms of age-related cataract, bilateral: Secondary | ICD-10-CM | POA: Diagnosis not present

## 2018-09-23 DIAGNOSIS — H5203 Hypermetropia, bilateral: Secondary | ICD-10-CM | POA: Diagnosis not present

## 2018-09-23 DIAGNOSIS — H532 Diplopia: Secondary | ICD-10-CM | POA: Diagnosis not present

## 2018-09-23 DIAGNOSIS — H52203 Unspecified astigmatism, bilateral: Secondary | ICD-10-CM | POA: Diagnosis not present

## 2018-09-23 LAB — HM DIABETES EYE EXAM

## 2018-09-24 ENCOUNTER — Encounter: Payer: Self-pay | Admitting: Internal Medicine

## 2018-09-24 ENCOUNTER — Ambulatory Visit (INDEPENDENT_AMBULATORY_CARE_PROVIDER_SITE_OTHER): Payer: Medicare Other | Admitting: Internal Medicine

## 2018-09-24 ENCOUNTER — Other Ambulatory Visit (INDEPENDENT_AMBULATORY_CARE_PROVIDER_SITE_OTHER): Payer: Medicare Other

## 2018-09-24 ENCOUNTER — Ambulatory Visit (HOSPITAL_COMMUNITY)
Admission: RE | Admit: 2018-09-24 | Discharge: 2018-09-24 | Disposition: A | Discharge status: Home / Self Care | Payer: Medicare Other | Source: Ambulatory Visit | Attending: Internal Medicine | Admitting: Internal Medicine

## 2018-09-24 VITALS — BP 160/40 | HR 61 | Temp 97.6°F | Ht 60.0 in | Wt 206.0 lb

## 2018-09-24 DIAGNOSIS — G4451 Hemicrania continua: Secondary | ICD-10-CM | POA: Insufficient documentation

## 2018-09-24 DIAGNOSIS — D510 Vitamin B12 deficiency anemia due to intrinsic factor deficiency: Secondary | ICD-10-CM

## 2018-09-24 DIAGNOSIS — R270 Ataxia, unspecified: Secondary | ICD-10-CM | POA: Insufficient documentation

## 2018-09-24 DIAGNOSIS — D500 Iron deficiency anemia secondary to blood loss (chronic): Secondary | ICD-10-CM

## 2018-09-24 DIAGNOSIS — N183 Chronic kidney disease, stage 3 unspecified: Secondary | ICD-10-CM

## 2018-09-24 DIAGNOSIS — H538 Other visual disturbances: Secondary | ICD-10-CM | POA: Diagnosis not present

## 2018-09-24 DIAGNOSIS — G3281 Cerebellar ataxia in diseases classified elsewhere: Secondary | ICD-10-CM | POA: Insufficient documentation

## 2018-09-24 DIAGNOSIS — R51 Headache: Secondary | ICD-10-CM | POA: Diagnosis not present

## 2018-09-24 DIAGNOSIS — J449 Chronic obstructive pulmonary disease, unspecified: Secondary | ICD-10-CM | POA: Diagnosis not present

## 2018-09-24 LAB — CBC WITH DIFFERENTIAL/PLATELET
Basophils Absolute: 0.1 10*3/uL (ref 0.0–0.1)
Basophils Relative: 1 % (ref 0.0–3.0)
Eosinophils Absolute: 0.3 10*3/uL (ref 0.0–0.7)
Eosinophils Relative: 2.9 % (ref 0.0–5.0)
HCT: 29 % — ABNORMAL LOW (ref 36.0–46.0)
Hemoglobin: 9.5 g/dL — ABNORMAL LOW (ref 12.0–15.0)
Lymphocytes Relative: 14 % (ref 12.0–46.0)
Lymphs Abs: 1.3 10*3/uL (ref 0.7–4.0)
MCHC: 32.9 g/dL (ref 30.0–36.0)
MCV: 82.2 fl (ref 78.0–100.0)
MONOS PCT: 9.2 % (ref 3.0–12.0)
Monocytes Absolute: 0.8 10*3/uL (ref 0.1–1.0)
Neutro Abs: 6.6 10*3/uL (ref 1.4–7.7)
Neutrophils Relative %: 72.9 % (ref 43.0–77.0)
Platelets: 207 10*3/uL (ref 150.0–400.0)
RBC: 3.52 Mil/uL — ABNORMAL LOW (ref 3.87–5.11)
RDW: 14.8 % (ref 11.5–15.5)
WBC: 9.1 10*3/uL (ref 4.0–10.5)

## 2018-09-24 LAB — SEDIMENTATION RATE: SED RATE: 44 mm/h — AB (ref 0–30)

## 2018-09-24 LAB — BASIC METABOLIC PANEL
BUN: 36 mg/dL — ABNORMAL HIGH (ref 6–23)
CO2: 31 mEq/L (ref 19–32)
Calcium: 9.9 mg/dL (ref 8.4–10.5)
Chloride: 101 mEq/L (ref 96–112)
Creatinine, Ser: 1.35 mg/dL — ABNORMAL HIGH (ref 0.40–1.20)
GFR: 37.91 mL/min — ABNORMAL LOW (ref >60.00)
Glucose, Bld: 97 mg/dL (ref 70–99)
Potassium: 4.3 mEq/L (ref 3.5–5.1)
Sodium: 139 mEq/L (ref 135–145)

## 2018-09-24 NOTE — Progress Notes (Signed)
Subjective:  Patient ID: [name], female    DOB: [birth year]  Age: 78 y.o. MRN: [id number]  CC: Headache   HPI [name] presents for concerns about a one-week history of new onset headache.  She says the headache started in bilateral frontal regions and has migrated to the left parietal area.  She describes it as a dull ache and says it is not the worst headache of her life.  She has also developed double vision and blurred vision and saw an ophthalmologist 1 day prior to this visit and was told that he is concerned about vascular changes.  She also complains of forgetfulness, dizziness, and ataxia.  She denies any new paresthesias.  Outpatient Medications Prior to Visit  Medication Sig Dispense Refill  . acetaminophen (TYLENOL) 325 MG tablet Take 325-650 mg by mouth daily as needed for moderate pain, fever or headache.     . albuterol (PROAIR HFA) 108 (90 Base) MCG/ACT inhaler Inhale 1-2 puffs into the lungs every 6 (six) hours as needed for wheezing or shortness of breath. 18 g 11  . allopurinol (ZYLOPRIM) 100 MG tablet TAKE 1 TABLET BY MOUTH EVERY DAY FOR GOUT 90 tablet 1  . BIDIL 20-37.5 MG tablet TAKE 1 TABLET BY MOUTH THREE TIMES DAILY 270 tablet 0  . cyanocobalamin 2000 MCG tablet Take 1 tablet (2,000 mcg total) by mouth daily. (Patient taking differently: Take 2,000 mcg by mouth every evening. ) 90 tablet 3  . Dietary Management Product (VASCULERA) TABS TAKE ONE TABLET BY MOUTH EVERY DAY 90 tablet 1  . ferrous sulfate 325 (65 FE) MG tablet Take 1 tablet (325 mg total) by mouth 2 (two) times daily with a meal. 180 tablet 1  . fluticasone furoate-vilanterol (BREO ELLIPTA) 100-25 MCG/INH AEPB Inhale 1 puff into the lungs daily. 60 each 11  . torsemide (DEMADEX) 20 MG tablet TAKE 1 TABLET(20 MG) BY MOUTH TWICE DAILY (Patient taking differently: Take 40 mg by mouth daily. ) 180 tablet 0  . Vitamin D, Ergocalciferol, (DRISDOL) 1.25 MG (50000 UT) CAPS capsule TAKE ONE CAPSULE BY  MOUTH ONCE WEEKLY 12 capsule 0  . metoprolol tartrate (LOPRESSOR) 25 MG tablet Take 0.5 tablets (12.5 mg total) by mouth 2 (two) times daily. 90 tablet 3   No facility-administered medications prior to visit.     ROS Review of Systems  Constitutional: Negative.  Negative for activity change, diaphoresis, fatigue, fever and unexpected weight change.  HENT: Negative.  Negative for facial swelling and trouble swallowing.   Eyes: Positive for visual disturbance. Negative for photophobia and pain.  Respiratory: Negative.  Negative for cough, chest tightness, shortness of breath and wheezing.   Cardiovascular: Negative for chest pain, palpitations and leg swelling.  Gastrointestinal: Negative for abdominal pain, diarrhea, nausea and vomiting.  Endocrine: Negative.   Genitourinary: Negative.  Negative for difficulty urinating and dysuria.  Musculoskeletal: Positive for gait problem. Negative for arthralgias, back pain, myalgias and neck pain.  Skin: Negative.   Neurological: Positive for dizziness and headaches. Negative for tremors, seizures, speech difficulty, weakness, light-headedness and numbness.  Hematological: Negative for adenopathy. Does not bruise/bleed easily.  Psychiatric/Behavioral: Negative.     Objective:  BP (!) 160/40 (BP Location: Left Arm, Patient Position: Sitting, Cuff Size: Large)   Pulse 61   Temp 97.6 F (36.4 C) (Oral)   Ht 5' (1.524 m)   Wt 206 lb (93.4 kg)   SpO2 96%   BMI 40.23 kg/m   BP Readings from Last  3 Encounters:  09/24/18 (!) 160/40  08/27/18 (!) 160/74  08/14/18 (!) 169/49    Wt Readings from Last 3 Encounters:  09/24/18 206 lb (93.4 kg)  08/27/18 206 lb 4 oz (93.6 kg)  08/12/18 202 lb 3.2 oz (91.7 kg)    Physical Exam Vitals signs reviewed.  Constitutional:      Appearance: She is obese. She is not ill-appearing or diaphoretic.  Eyes:     General: No scleral icterus.    Extraocular Movements: Extraocular movements intact.     Right  eye: Normal extraocular motion and no nystagmus.     Left eye: Normal extraocular motion and no nystagmus.     Pupils: Pupils are equal, round, and reactive to light. Pupils are equal.     Right eye: Pupil is round and reactive.     Left eye: Pupil is round and reactive.  Neck:     Musculoskeletal: Normal range of motion and neck supple. No neck rigidity.     Meningeal: Brudzinski's sign and Kernig's sign absent.  Cardiovascular:     Rate and Rhythm: Normal rate and regular rhythm.     Heart sounds: Murmur present. No gallop.   Pulmonary:     Effort: Pulmonary effort is normal. No respiratory distress.     Breath sounds: Normal breath sounds. No stridor. No wheezing, rhonchi or rales.  Abdominal:     General: Bowel sounds are normal.     Palpations: Abdomen is soft. There is no hepatomegaly or splenomegaly.     Tenderness: There is no abdominal tenderness.  Musculoskeletal: Normal range of motion.        General: Tenderness present.     Right lower leg: No edema.     Left lower leg: No edema.  Skin:    General: Skin is warm and dry.  Neurological:     Mental Status: She is alert and oriented to person, place, and time.     Cranial Nerves: Cranial nerves are intact.     Sensory: Sensation is intact.     Motor: Weakness present. No tremor, atrophy, abnormal muscle tone or seizure activity.     Coordination: Romberg sign positive. Coordination abnormal. Finger-Nose-Finger Test and Heel to [facility] Test normal. Rapid alternating movements normal.     Deep Tendon Reflexes: Babinski sign absent on the right side. Babinski sign absent on the left side.     Reflex Scores:      Tricep reflexes are 1+ on the right side and 1+ on the left side.      Bicep reflexes are 1+ on the right side and 1+ on the left side.      Brachioradialis reflexes are 1+ on the right side and 1+ on the left side.      Patellar reflexes are 1+ on the right side and 1+ on the left side.      Achilles reflexes are 0 on  the right side and 0 on the left side.    Comments: Mild, bilateral, symmetrical weakness  Psychiatric:        Mood and Affect: Mood normal.        Behavior: Behavior normal.     Lab Results  Component Value Date   WBC 9.1 09/24/2018   HGB 9.5 (L) 09/24/2018   HCT 29.0 (L) 09/24/2018   PLT 207.0 09/24/2018   GLUCOSE 97 09/24/2018   CHOL 151 02/04/2018   TRIG 85.0 02/04/2018   HDL 56.30 02/04/2018   LDLDIRECT 107.0 09/04/2016  LDLCALC 78 02/04/2018   ALT 10 08/04/2018   AST 16 08/04/2018   NA 139 09/24/2018   K 4.3 09/24/2018   CL 101 09/24/2018   CREATININE 1.35 (H) 09/24/2018   BUN 36 (H) 09/24/2018   CO2 31 09/24/2018   TSH 3.276 10/17/2017   INR 1.07 08/08/2017   HGBA1C 5.4 11/05/2017   MICROALBUR <0.7 11/05/2017    Ct Abdomen Pelvis Wo Contrast  Result Date: 08/12/2018 CLINICAL DATA:  Incomplete colonoscopy. Question umbilical hernia. Exclude perforation. EXAM: CT ABDOMEN AND PELVIS WITHOUT CONTRAST TECHNIQUE: Multidetector CT imaging of the abdomen and pelvis was performed following the standard protocol without IV contrast. COMPARISON:  CT 03/19/2016 FINDINGS: Lower chest: Mild basilar atelectasis. Hepatobiliary: No focal hepatic lesion. Small gallstones noted. No duct dilatation. Pancreas: Pancreas is normal. No ductal dilatation. No pancreatic inflammation. Spleen: Normal spleen Adrenals/urinary tract: Adrenal glands normal. Bilateral mixed density renal cystic lesions can not be fully characterized without IV contrast no ureteral obstruction. Bladder normal. Stomach/Bowel: Stomach, small-bowel cecum normal. Terminal ileum normal. Ascending colon normal. A 10 cm segment of the transverse colon enters the umbilical hernia. There is no caliber change entering or exiting the hernia. No evidence obstruction. The descending colon and rectum are normal. No evidence of perforation or free air. Interestingly, the appendix enters the umbilical hernia sac. No evidence of  inflammation (image 52/2) Vascular/Lymphatic: Abdominal aorta is normal caliber with atherosclerotic calcification. There is no retroperitoneal or periportal lymphadenopathy. No pelvic lymphadenopathy. Reproductive: Uterus and ovaries normal. Other: No free fluid Musculoskeletal: No aggressive osseous lesion. IMPRESSION: 1. No evidence of perforation following colonoscopy. No intraperitoneal free air or free fluid. 2. Large midline ventral hernia contains a long segment of nonobstructed transverse colon. 3. Appendix also enters the umbilical hernia without evidence of inflammation or infection. 4. Bilateral mixed density renal cysts are favored benign. 5.  Aortic Atherosclerosis (ICD10-I70.0). Electronically Signed   By: Suzy Bouchard M.D.   On: 08/12/2018 16:08   Dg Abd Portable 2v  Result Date: 08/12/2018 CLINICAL DATA:  Generalized abdominal pain after colonoscopy which was performed earlier today. EXAM: PORTABLE ABDOMEN - 2 VIEW COMPARISON:  CT abdomen and pelvis 03/19/2016. Acute abdomen series 04/22/2014. FINDINGS: Bowel gas pattern unremarkable without evidence of obstruction or significant ileus. No evidence of free air or significant air-fluid levels on the lateral decubitus image. Expected stool burden in the colon. No visible opaque urinary tract calculi. Aortoiliac atherosclerosis without evidence of aneurysm. Degenerative changes involving the lower thoracic and lumbar spine. IMPRESSION: No acute abdominal abnormality. Electronically Signed   By: Evangeline Dakin M.D.   On: 08/12/2018 10:53    Assessment & Plan:   Novice was seen today for headache.  Diagnoses and all orders for this visit:  Hemicrania continua- Her sed rate is not elevated considering her age and is actually lower than it was when it was previously tested so I do not think her headache is related to vasculitis or temporal arteritis.  She has neurological complaints but no localizing neurological findings.  I have asked  her to undergo an MRI to see if there is bleeding, tumor, CVA, or aneurysm. -     MR Brain Wo Contrast; Future -     Sedimentation rate; Future  Ataxia- See above -     MR Brain Wo Contrast; Future -     Sedimentation rate; Future  Cerebellar ataxia in diseases classified elsewhere [facility])- See above -     MR Brain Wo Contrast; Future  Iron deficiency anemia due to chronic blood loss- Her H/H are stable. -     CBC with Differential/Platelet; Future  Vitamin B12 deficiency anemia due to intrinsic factor deficiency -     CBC with Differential/Platelet; Future  CKD (chronic kidney disease), stage III (HCC)-renal function is stable.  She will avoid nephrotoxic agents. -     Basic metabolic panel; Future   I am having [name] maintain her cyanocobalamin, albuterol, acetaminophen, ferrous sulfate, metoprolol tartrate, BIDIL, fluticasone furoate-vilanterol, VASCULERA, torsemide, Vitamin D (Ergocalciferol), and allopurinol.  No orders of the defined types were placed in this encounter.    Follow-up: Return if symptoms worsen or fail to improve.  [name], MD

## 2018-09-24 NOTE — Patient Instructions (Signed)
General Headache Without Cause A headache is pain or discomfort that is felt around the head or neck area. There are many causes and types of headaches. In some cases, the cause may not be found. Follow these instructions at home: Watch your condition for any changes. Let your doctor know about them. Take these steps to help with your condition: Managing pain      Take over-the-counter and prescription medicines only as told by your doctor.  Lie down in a dark, quiet room when you have a headache.  If told, put ice on your head and neck area: ? Put ice in a plastic bag. ? Place a towel between your skin and the bag. ? Leave the ice on for 20 minutes, 2-3 times per day.  If told, put heat on the affected area. Use the heat source that your doctor recommends, such as a moist heat pack or a heating pad. ? Place a towel between your skin and the heat source. ? Leave the heat on for 20-30 minutes. ? Remove the heat if your skin turns bright red. This is very important if you are unable to feel pain, heat, or cold. You may have a greater risk of getting burned.  Keep lights dim if bright lights bother you or make your headaches worse. Eating and drinking  Eat meals on a regular schedule.  If you drink alcohol: ? Limit how much you use to:  0-1 drink a day for women.  0-2 drinks a day for men. ? Be aware of how much alcohol is in your drink. In the [place] one drink equals one 12 oz bottle of beer (355 mL), one 5 oz glass of wine (148 mL), or one 1 oz glass of hard liquor (44 mL).  Stop drinking caffeine, or reduce how much caffeine you drink. General instructions   Keep a journal to find out if certain things bring on headaches. For example, write down: ? What you eat and drink. ? How much sleep you get. ? Any change to your diet or medicines.  Get a massage or try other ways to relax.  Limit stress.  Sit up straight. Do not tighten (tense) your muscles.  Do not use any  products that contain nicotine or tobacco. This includes cigarettes, e-cigarettes, and chewing tobacco. If you need help quitting, ask your doctor.  Exercise regularly as told by your doctor.  Get enough sleep. This often means 7-9 hours of sleep each night.  Keep all follow-up visits as told by your doctor. This is important. Contact a doctor if:  Your symptoms are not helped by medicine.  You have a headache that feels different than the other headaches.  You feel sick to your stomach (nauseous) or you throw up (vomit).  You have a fever. Get help right away if:  Your headache gets very bad quickly.  Your headache gets worse after a lot of physical activity.  You keep throwing up.  You have a stiff neck.  You have trouble seeing.  You have trouble speaking.  You have pain in the eye or ear.  Your muscles are weak or you lose muscle control.  You lose your balance or have trouble walking.  You feel like you will pass out (faint) or you pass out.  You are mixed up (confused).  You have a seizure. Summary  A headache is pain or discomfort that is felt around the head or neck area.  There are many causes and   types of headaches. In some cases, the cause may not be found.  Keep a journal to help find out what causes your headaches. Watch your condition for any changes. Let your doctor know about them.  Contact a doctor if you have a headache that is different from usual, or if your headache is not helped by medicine.  Get help right away if your headache gets very bad, you throw up, you have trouble seeing, you lose your balance, or you have a seizure. This information is not intended to replace advice given to you by your health care provider. Make sure you discuss any questions you have with your health care provider. Document Released: 04/24/2008 Document Revised: 02/03/2018 Document Reviewed: 02/03/2018 Elsevier Interactive Patient Education  2019 [employer]  

## 2018-10-07 DIAGNOSIS — H532 Diplopia: Secondary | ICD-10-CM | POA: Diagnosis not present

## 2018-10-23 DIAGNOSIS — J449 Chronic obstructive pulmonary disease, unspecified: Secondary | ICD-10-CM | POA: Diagnosis not present

## 2018-10-23 DIAGNOSIS — L03119 Cellulitis of unspecified part of limb: Secondary | ICD-10-CM | POA: Diagnosis not present

## 2018-11-04 ENCOUNTER — Other Ambulatory Visit: Payer: Self-pay | Admitting: Internal Medicine

## 2018-11-04 DIAGNOSIS — I10 Essential (primary) hypertension: Secondary | ICD-10-CM

## 2018-11-04 DIAGNOSIS — I5042 Chronic combined systolic (congestive) and diastolic (congestive) heart failure: Secondary | ICD-10-CM

## 2018-11-04 DIAGNOSIS — H2513 Age-related nuclear cataract, bilateral: Secondary | ICD-10-CM | POA: Diagnosis not present

## 2018-11-04 DIAGNOSIS — H532 Diplopia: Secondary | ICD-10-CM | POA: Diagnosis not present

## 2018-11-04 MED ORDER — TORSEMIDE 20 MG PO TABS: ORAL_TABLET | ORAL | 1 refills | Status: DC

## 2018-11-18 ENCOUNTER — Other Ambulatory Visit: Payer: Self-pay | Admitting: Internal Medicine

## 2018-11-18 DIAGNOSIS — J411 Mucopurulent chronic bronchitis: Secondary | ICD-10-CM

## 2018-11-23 DIAGNOSIS — L03119 Cellulitis of unspecified part of limb: Secondary | ICD-10-CM | POA: Diagnosis not present

## 2018-11-23 DIAGNOSIS — J449 Chronic obstructive pulmonary disease, unspecified: Secondary | ICD-10-CM | POA: Diagnosis not present

## 2018-11-26 ENCOUNTER — Other Ambulatory Visit: Payer: Self-pay | Admitting: Internal Medicine

## 2018-11-26 DIAGNOSIS — I5042 Chronic combined systolic (congestive) and diastolic (congestive) heart failure: Secondary | ICD-10-CM

## 2018-12-02 ENCOUNTER — Other Ambulatory Visit: Payer: Self-pay | Admitting: Internal Medicine

## 2018-12-02 DIAGNOSIS — E559 Vitamin D deficiency, unspecified: Secondary | ICD-10-CM

## 2018-12-02 MED ORDER — VITAMIN D (ERGOCALCIFEROL) 1.25 MG (50000 UNIT) PO CAPS: ORAL_CAPSULE | ORAL | 0 refills | Status: DC

## 2018-12-08 ENCOUNTER — Other Ambulatory Visit: Payer: Self-pay | Admitting: Internal Medicine

## 2018-12-08 DIAGNOSIS — M1030 Gout due to renal impairment, unspecified site: Secondary | ICD-10-CM

## 2018-12-08 MED ORDER — ALLOPURINOL 100 MG PO TABS: ORAL_TABLET | ORAL | 0 refills | Status: DC

## 2018-12-23 DIAGNOSIS — L03119 Cellulitis of unspecified part of limb: Secondary | ICD-10-CM | POA: Diagnosis not present

## 2018-12-23 DIAGNOSIS — J449 Chronic obstructive pulmonary disease, unspecified: Secondary | ICD-10-CM | POA: Diagnosis not present

## 2018-12-29 ENCOUNTER — Ambulatory Visit (INDEPENDENT_AMBULATORY_CARE_PROVIDER_SITE_OTHER): Payer: Medicare Other | Admitting: Internal Medicine

## 2018-12-29 ENCOUNTER — Encounter: Payer: Self-pay | Admitting: Internal Medicine

## 2018-12-29 ENCOUNTER — Other Ambulatory Visit: Payer: Self-pay

## 2018-12-29 ENCOUNTER — Other Ambulatory Visit (INDEPENDENT_AMBULATORY_CARE_PROVIDER_SITE_OTHER): Payer: Medicare Other

## 2018-12-29 VITALS — BP 184/50 | HR 66 | Temp 97.9°F | Ht 60.0 in | Wt 209.0 lb

## 2018-12-29 DIAGNOSIS — N183 Chronic kidney disease, stage 3 unspecified: Secondary | ICD-10-CM

## 2018-12-29 DIAGNOSIS — E785 Hyperlipidemia, unspecified: Secondary | ICD-10-CM

## 2018-12-29 DIAGNOSIS — D510 Vitamin B12 deficiency anemia due to intrinsic factor deficiency: Secondary | ICD-10-CM

## 2018-12-29 DIAGNOSIS — D500 Iron deficiency anemia secondary to blood loss (chronic): Secondary | ICD-10-CM

## 2018-12-29 DIAGNOSIS — E559 Vitamin D deficiency, unspecified: Secondary | ICD-10-CM

## 2018-12-29 DIAGNOSIS — K420 Umbilical hernia with obstruction, without gangrene: Secondary | ICD-10-CM

## 2018-12-29 DIAGNOSIS — R1032 Left lower quadrant pain: Secondary | ICD-10-CM

## 2018-12-29 LAB — CBC WITH DIFFERENTIAL/PLATELET
Basophils Absolute: 0.1 10*3/uL (ref 0.0–0.1)
Basophils Relative: 0.6 % (ref 0.0–3.0)
Eosinophils Absolute: 0.2 10*3/uL (ref 0.0–0.7)
Eosinophils Relative: 1.9 % (ref 0.0–5.0)
HCT: 31.1 % — ABNORMAL LOW (ref 36.0–46.0)
Hemoglobin: 10.3 g/dL — ABNORMAL LOW (ref 12.0–15.0)
Lymphocytes Relative: 15.1 % (ref 12.0–46.0)
Lymphs Abs: 1.6 10*3/uL (ref 0.7–4.0)
MCHC: 33.1 g/dL (ref 30.0–36.0)
MCV: 82.1 fl (ref 78.0–100.0)
Monocytes Absolute: 1.1 10*3/uL — ABNORMAL HIGH (ref 0.1–1.0)
Monocytes Relative: 10 % (ref 3.0–12.0)
Neutro Abs: 7.8 10*3/uL — ABNORMAL HIGH (ref 1.4–7.7)
Neutrophils Relative %: 72.4 % (ref 43.0–77.0)
Platelets: 227 10*3/uL (ref 150.0–400.0)
RBC: 3.79 Mil/uL — ABNORMAL LOW (ref 3.87–5.11)
RDW: 14.4 % (ref 11.5–15.5)
WBC: 10.8 10*3/uL — ABNORMAL HIGH (ref 4.0–10.5)

## 2018-12-29 LAB — LIPID PANEL
Cholesterol: 121 mg/dL (ref 0–200)
HDL: 54.6 mg/dL (ref >39.00)
LDL Cholesterol: 47 mg/dL (ref 0–99)
NonHDL: 66.5
Total CHOL/HDL Ratio: 2
Triglycerides: 97 mg/dL (ref 0.0–149.0)
VLDL: 19.4 mg/dL (ref 0.0–40.0)

## 2018-12-29 LAB — FOLATE: Folate: 12.3 ng/mL (ref >5.9)

## 2018-12-29 LAB — BASIC METABOLIC PANEL
BUN: 40 mg/dL — ABNORMAL HIGH (ref 6–23)
CO2: 29 mEq/L (ref 19–32)
Calcium: 9.8 mg/dL (ref 8.4–10.5)
Chloride: 100 mEq/L (ref 96–112)
Creatinine, Ser: 1.27 mg/dL — ABNORMAL HIGH (ref 0.40–1.20)
GFR: 40.65 mL/min — ABNORMAL LOW (ref >60.00)
Glucose, Bld: 83 mg/dL (ref 70–99)
Potassium: 4.2 mEq/L (ref 3.5–5.1)
Sodium: 138 mEq/L (ref 135–145)

## 2018-12-29 LAB — VITAMIN B12: Vitamin B-12: >1500 pg/mL — ABNORMAL HIGH (ref 211–911)

## 2018-12-29 LAB — VITAMIN D 25 HYDROXY (VIT D DEFICIENCY, FRACTURES): VITD: 93.56 ng/mL (ref 30.00–100.00)

## 2018-12-29 LAB — IBC PANEL
Iron: 166 ug/dL — ABNORMAL HIGH (ref 42–145)
Saturation Ratios: 47.4 % (ref 20.0–50.0)
Transferrin: 250 mg/dL (ref 212.0–360.0)

## 2018-12-29 LAB — FERRITIN: Ferritin: 28.9 ng/mL (ref 10.0–291.0)

## 2018-12-29 MED ORDER — FERROUS SULFATE 325 (65 FE) MG PO TABS: 325.0000 mg | ORAL_TABLET | Freq: Every day | ORAL | 1 refills | Status: DC

## 2018-12-29 NOTE — Patient Instructions (Signed)
Iron Deficiency Anemia, Adult  Iron deficiency anemia is a condition in which the concentration of red blood cells or hemoglobin in the blood is below normal because of too little iron. Hemoglobin is a substance in red blood cells that carries oxygen to the body's tissues. When the concentration of red blood cells or hemoglobin is too low, not enough oxygen reaches these tissues.  Iron deficiency anemia is usually long-lasting (chronic) and it develops over time. It may or may not cause symptoms. It is a common type of anemia.  What are the causes?  This condition may be caused by:   Not enough iron in the diet.   Blood loss caused by bleeding in the intestine.   Blood loss from a gastrointestinal condition like Crohn disease.   Frequent blood draws, such as from blood donation.   Abnormal absorption in the gut.   Heavy menstrual periods in women.   Cancers of the gastrointestinal system, such as colon cancer.  What are the signs or symptoms?  Symptoms of this condition may include:   Fatigue.   Headache.   Pale skin, lips, and nail beds.   Poor appetite.   Weakness.   Shortness of breath.   Dizziness.   Cold hands and feet.   Fast or irregular heartbeat.   Irritability. This is more common in severe anemia.   Rapid breathing. This is more common in severe anemia.  Mild anemia may not cause any symptoms.  How is this diagnosed?  This condition is diagnosed based on:   Your medical history.   A physical exam.   Blood tests.  You may have additional tests to find the underlying cause of your anemia, such as:   Testing for blood in the stool (fecal occult blood test).   A procedure to see inside your colon and rectum (colonoscopy).   A procedure to see inside your esophagus and stomach (endoscopy).   A test in which cells are removed from bone marrow (bone marrow aspiration) or fluid is removed from the bone marrow to be examined (biopsy). This is rarely needed.  How is this treated?  This  condition is treated by correcting the cause of your iron deficiency. Treatment may involve:   Adding iron-rich foods to your diet.   Taking iron supplements. If you are pregnant or breastfeeding, you may need to take extra iron because your normal diet usually does not provide the amount of iron that you need.   Increasing vitamin C intake. Vitamin C helps your body absorb iron. Your health care provider may recommend that you take iron supplements along with a glass of orange juice or a vitamin C supplement.   Medicines to make heavy menstrual flow lighter.   Surgery.  You may need repeat blood tests to determine whether treatment is working. Depending on the underlying cause, the anemia should be corrected within 2 months of starting treatment. If the treatment does not seem to be working, you may need more testing.  Follow these instructions at home:  Medicines   Take over-the-counter and prescription medicines only as told by your health care provider. This includes iron supplements and vitamins.   If you cannot tolerate taking iron supplements by mouth, talk with your health care provider about taking them through a vein (intravenously) or an injection into a muscle.   For the best iron absorption, you should take iron supplements when your stomach is empty. If you cannot tolerate them on an empty stomach,   you may need to take them with food.   Do not drink milk or take antacids at the same time as your iron supplements. Milk and antacids may interfere with iron absorption.   Iron supplements can cause constipation. To prevent constipation, include fiber in your diet as told by your health care provider. A stool softener may also be recommended.  Eating and drinking     Talk with your health care provider before changing your diet. He or she may recommend that you eat foods that contain a lot of iron, such as:  ? Liver.  ? Low-fat (lean) beef.  ? Breads and cereals that have iron added to them (are  fortified).  ? Eggs.  ? Dried fruit.  ? Dark green, leafy vegetables.   To help your body use the iron from iron-rich foods, eat those foods at the same time as fresh fruits and vegetables that are high in vitamin C. Foods that are high in vitamin C include:  ? Oranges.  ? Peppers.  ? Tomatoes.  ? Mangoes.   Drinkenoughfluid to keep your urine clear or pale yellow.  General instructions   Return to your normal activities as told by your health care provider. Ask your health care provider what activities are safe for you.   Practice good hygiene. Anemia can make you more prone to illness and infection.   Keep all follow-up visits as told by your health care provider. This is important.  Contact a health care provider if:   You feel nauseous or you vomit.   You feel weak.   You have unexplained sweating.   You develop symptoms of constipation, such as:  ? Having fewer than three bowel movements a week.  ? Straining to have a bowel movement.  ? Having stools that are hard, dry, or larger than normal.  ? Feeling full or bloated.  ? Pain in the lower abdomen.  ? Not feeling relief after having a bowel movement.  Get help right away if:   You faint. If this happens, do not drive yourself to the hospital. Call your local emergency services (911 in the [place]   You have chest pain.   You have shortness of breath that:  ? Is severe.  ? Gets worse with physical activity.   You have a rapid heartbeat.   You become light-headed when getting up from a sitting or lying down position.  This information is not intended to replace advice given to you by your health care provider. Make sure you discuss any questions you have with your health care provider.  Document Released: 07/13/2000 Document Revised: 04/04/2016 Document Reviewed: 04/04/2016  Elsevier Interactive Patient Education  2019 [employer]vier Inc[employer]

## 2018-12-29 NOTE — Progress Notes (Signed)
Subjective:  Patient ID: [name], female    DOB: 1940-11-19  Age: 78 y.o. MRN: [id number]  CC: Anemia   HPI MOZETTA MURFIN presents for f/up - She feels like she is at her baseline.  She has had intermittent left lower quadrant abdominal pain for about 6 months.  She has been told it is related to a ventral abdominal hernia that cannot be operated on because she is too high risk.  She complains of chronic unchanged shortness of breath.  She complains of weight gain and lower extremity edema.  She denies chest pain, cough, loss of appetite, or paresthesias.  She is taking B12 tablets.  Outpatient Medications Prior to Visit  Medication Sig Dispense Refill  . acetaminophen (TYLENOL) 325 MG tablet Take 325-650 mg by mouth daily as needed for moderate pain, fever or headache.     . albuterol (PROAIR HFA) 108 (90 Base) MCG/ACT inhaler Inhale 1-2 puffs into the lungs every 6 (six) hours as needed for wheezing or shortness of breath. 18 g 11  . allopurinol (ZYLOPRIM) 100 MG tablet One po QD 90 tablet 0  . BIDIL 20-37.5 MG tablet TAKE 1 TABLET BY MOUTH THREE TIMES DAILY 270 tablet 0  . BREO ELLIPTA 100-25 MCG/INH AEPB INHALE 1 PUFF INTO THE LUNGS DAILY 90 each 1  . Dietary Management Product (VASCULERA) TABS TAKE ONE TABLET BY MOUTH EVERY DAY 90 tablet 1  . torsemide (DEMADEX) 20 MG tablet TAKE 1 TABLET(20 MG) BY MOUTH TWICE DAILY 180 tablet 1  . cyanocobalamin 2000 MCG tablet Take 1 tablet (2,000 mcg total) by mouth daily. (Patient taking differently: Take 2,000 mcg by mouth every evening. ) 90 tablet 3  . ferrous sulfate 325 (65 FE) MG tablet Take 1 tablet (325 mg total) by mouth 2 (two) times daily with a meal. 180 tablet 1  . Vitamin D, Ergocalciferol, (DRISDOL) 1.25 MG (50000 UT) CAPS capsule TAKE ONE CAPSULE BY MOUTH ONCE WEEKLY 4 capsule 0  . metoprolol tartrate (LOPRESSOR) 25 MG tablet Take 0.5 tablets (12.5 mg total) by mouth 2 (two) times daily. 90 tablet 3   No  facility-administered medications prior to visit.     ROS Review of Systems  Constitutional: Positive for unexpected weight change (wt gain). Negative for appetite change, diaphoresis and fatigue.  HENT: Negative.   Eyes: Negative for visual disturbance.  Respiratory: Positive for shortness of breath. Negative for cough, chest tightness and wheezing.   Cardiovascular: Positive for leg swelling. Negative for chest pain and palpitations.  Gastrointestinal: Positive for abdominal pain. Negative for abdominal distention, constipation, diarrhea, nausea and vomiting.  Genitourinary: Negative.  Negative for difficulty urinating.  Musculoskeletal: Negative.  Negative for arthralgias and myalgias.  Skin: Negative.  Negative for color change and rash.  Neurological: Negative.  Negative for dizziness, light-headedness and numbness.  Hematological: Negative for adenopathy. Does not bruise/bleed easily.  Psychiatric/Behavioral: Negative.     Objective:  BP (!) 184/50 (BP Location: Left Arm, Patient Position: Sitting, Cuff Size: Large)   Pulse 66   Temp 97.9 F (36.6 C) (Oral)   Ht 5' (1.524 m)   Wt 209 lb (94.8 kg)   SpO2 93%   BMI 40.82 kg/m   BP Readings from Last 3 Encounters:  12/29/18 (!) 184/50  09/24/18 (!) 160/40  08/27/18 (!) 160/74    Wt Readings from Last 3 Encounters:  12/29/18 209 lb (94.8 kg)  09/24/18 206 lb (93.4 kg)  08/27/18 206 lb 4 oz (93.6 kg)  Physical Exam Vitals signs reviewed.  Constitutional:      Appearance: She is obese. She is not ill-appearing or diaphoretic.  HENT:     Nose: Nose normal.     Mouth/Throat:     Mouth: Mucous membranes are moist.  Eyes:     General: No scleral icterus.    Conjunctiva/sclera: Conjunctivae normal.  Neck:     Musculoskeletal: Normal range of motion. No neck rigidity.  Cardiovascular:     Rate and Rhythm: Normal rate and regular rhythm.     Heart sounds: No murmur. No gallop.   Pulmonary:     Effort: No  respiratory distress.     Breath sounds: No stridor. No wheezing, rhonchi or rales.  Abdominal:     General: Abdomen is protuberant. Bowel sounds are normal. There is no distension.     Palpations: There is no hepatomegaly or splenomegaly.     Tenderness: There is abdominal tenderness in the left upper quadrant. There is no guarding or rebound.     Hernia: A hernia is present. Hernia is present in the umbilical area and ventral area.  Musculoskeletal: Normal range of motion.     Right lower leg: Edema (trace) present.     Left lower leg: Edema (trace) present.  Lymphadenopathy:     Cervical: No cervical adenopathy.  Neurological:     General: No focal deficit present.  Psychiatric:        Mood and Affect: Mood normal.        Behavior: Behavior normal.     Lab Results  Component Value Date   WBC 10.8 (H) 12/29/2018   HGB 10.3 (L) 12/29/2018   HCT 31.1 (L) 12/29/2018   PLT 227.0 12/29/2018   GLUCOSE 83 12/29/2018   CHOL 121 12/29/2018   TRIG 97.0 12/29/2018   HDL 54.60 12/29/2018   LDLDIRECT 107.0 09/04/2016   LDLCALC 47 12/29/2018   ALT 10 08/04/2018   AST 16 08/04/2018   NA 138 12/29/2018   K 4.2 12/29/2018   CL 100 12/29/2018   CREATININE 1.27 (H) 12/29/2018   BUN 40 (H) 12/29/2018   CO2 29 12/29/2018   TSH 3.276 10/17/2017   INR 1.07 08/08/2017   HGBA1C 5.4 11/05/2017   MICROALBUR <0.7 11/05/2017    Mr Brain Wo Contrast  Result Date: 09/24/2018 CLINICAL DATA:  Recent onset headaches with double vision. EXAM: MRI HEAD WITHOUT CONTRAST TECHNIQUE: Multiplanar, multiecho pulse sequences of the brain and surrounding structures were obtained without intravenous contrast. COMPARISON:  Head CT 12/22/2006 FINDINGS: BRAIN: There is no acute infarct, acute hemorrhage, hydrocephalus or extra-axial collection. The midline structures are normal. No midline shift or other mass effect. Multifocal white matter hyperintensity, most commonly due to chronic ischemic microangiopathy.  Prominent perivascular space of the right lentiform nucleus. Generalized atrophy without lobar predilection. Susceptibility-sensitive sequences show no chronic microhemorrhage or superficial siderosis. VASCULAR: Major intracranial arterial and venous sinus flow voids are normal. SKULL AND UPPER CERVICAL SPINE: Calvarial bone marrow signal is normal. There is no skull base mass. Visualized upper cervical spine and soft tissues are normal. SINUSES/ORBITS: No fluid levels or advanced mucosal thickening. No mastoid or middle ear effusion. The orbits are normal. IMPRESSION: Generalized volume loss and mild chronic small vessel disease without acute intracranial abnormality. Electronically Signed   By: Ulyses Jarred M.D.   On: 09/24/2018 20:30    Assessment & Plan:   Lannie was seen today for anemia.  Diagnoses and all orders for this visit:  CKD (  chronic kidney disease), stage III ([place])- She will avoid nephrotoxic agents. -     Basic metabolic panel; Future  Iron deficiency anemia due to chronic blood loss- Her H&H is better but she is still mildly anemic.  Her iron level is high and her other vitamin levels are normal.  I think most of the anemia is caused by renal insufficiency and the anemia of chronic disease.  I recommended that she stop taking the iron supplement. -     CBC with Differential/Platelet; Future -     Ferritin; Future -     IBC panel; Future -     ferrous sulfate 325 (65 FE) MG tablet; Take 1 tablet (325 mg total) by mouth daily with breakfast.  Vitamin B12 deficiency anemia due to intrinsic factor deficiency -     CBC with Differential/Platelet; Future -     Folate; Future -     Vitamin B12; Future  Vitamin D deficiency -     VITAMIN D 25 Hydroxy (Vit-D Deficiency, Fractures); Future  Hyperlipidemia with target LDL less than 100- She has an elevated ASCVD risk score but is not willing to take a statin for CV risk reduction. -     Lipid panel; Future  Left lower quadrant  abdominal pain  Umbilical hernia with obstruction- I think this explains her abdominal discomfort.  She will let me know if she changes her mind about seeing a Education officer, environmental.   I have discontinued Larry Sierras. Gorden's cyanocobalamin and Vitamin D (Ergocalciferol). I have also changed her ferrous sulfate. Additionally, I am having her maintain her albuterol, acetaminophen, metoprolol tartrate, Vasculera, torsemide, Breo Ellipta, BiDil, and allopurinol.  Meds ordered this encounter  Medications  . ferrous sulfate 325 (65 FE) MG tablet    Sig: Take 1 tablet (325 mg total) by mouth daily with breakfast.    Dispense:  90 tablet    Refill:  1     Follow-up: Return in about 6 months (around 06/30/2019).  [name], MD

## 2019-01-23 DIAGNOSIS — J449 Chronic obstructive pulmonary disease, unspecified: Secondary | ICD-10-CM | POA: Diagnosis not present

## 2019-01-23 DIAGNOSIS — L03119 Cellulitis of unspecified part of limb: Secondary | ICD-10-CM | POA: Diagnosis not present

## 2019-02-22 DIAGNOSIS — J449 Chronic obstructive pulmonary disease, unspecified: Secondary | ICD-10-CM | POA: Diagnosis not present

## 2019-02-22 DIAGNOSIS — L03119 Cellulitis of unspecified part of limb: Secondary | ICD-10-CM | POA: Diagnosis not present

## 2019-03-04 DIAGNOSIS — H2513 Age-related nuclear cataract, bilateral: Secondary | ICD-10-CM | POA: Diagnosis not present

## 2019-03-04 DIAGNOSIS — E119 Type 2 diabetes mellitus without complications: Secondary | ICD-10-CM | POA: Diagnosis not present

## 2019-03-04 DIAGNOSIS — H5203 Hypermetropia, bilateral: Secondary | ICD-10-CM | POA: Diagnosis not present

## 2019-03-04 DIAGNOSIS — H04123 Dry eye syndrome of bilateral lacrimal glands: Secondary | ICD-10-CM | POA: Diagnosis not present

## 2019-03-11 ENCOUNTER — Other Ambulatory Visit: Payer: Self-pay | Admitting: Internal Medicine

## 2019-03-11 DIAGNOSIS — M1030 Gout due to renal impairment, unspecified site: Secondary | ICD-10-CM

## 2019-03-11 MED ORDER — ALLOPURINOL 100 MG PO TABS: ORAL_TABLET | ORAL | 0 refills | Status: DC

## 2019-03-25 DIAGNOSIS — L03119 Cellulitis of unspecified part of limb: Secondary | ICD-10-CM | POA: Diagnosis not present

## 2019-03-25 DIAGNOSIS — J449 Chronic obstructive pulmonary disease, unspecified: Secondary | ICD-10-CM | POA: Diagnosis not present

## 2019-04-15 ENCOUNTER — Other Ambulatory Visit: Payer: Self-pay | Admitting: Internal Medicine

## 2019-04-15 DIAGNOSIS — I5042 Chronic combined systolic (congestive) and diastolic (congestive) heart failure: Secondary | ICD-10-CM

## 2019-04-15 MED ORDER — BIDIL 20-37.5 MG PO TABS: 1.0000 | ORAL_TABLET | Freq: Three times a day (TID) | ORAL | 0 refills | Status: DC

## 2019-04-23 ENCOUNTER — Telehealth: Payer: Self-pay | Admitting: Internal Medicine

## 2019-04-23 DIAGNOSIS — J411 Mucopurulent chronic bronchitis: Secondary | ICD-10-CM

## 2019-04-23 MED ORDER — BREO ELLIPTA 100-25 MCG/INH IN AEPB: INHALATION_SPRAY | RESPIRATORY_TRACT | 1 refills | Status: DC

## 2019-04-23 NOTE — Telephone Encounter (Signed)
Rx printed, signed, upfront for p/u.

## 2019-04-23 NOTE — Telephone Encounter (Signed)
Pt informed of below.  

## 2019-04-23 NOTE — Telephone Encounter (Signed)
Patient requesting BREO ELLIPTA 100-25 MCG/INH AEPB hard copy RX, please advise when ready at the front desk. Patient states she's currently in the donut hole and trying to get inhaler free from the manufacture.

## 2019-04-25 DIAGNOSIS — J449 Chronic obstructive pulmonary disease, unspecified: Secondary | ICD-10-CM | POA: Diagnosis not present

## 2019-04-25 DIAGNOSIS — L03119 Cellulitis of unspecified part of limb: Secondary | ICD-10-CM | POA: Diagnosis not present

## 2019-05-04 ENCOUNTER — Other Ambulatory Visit: Payer: Self-pay | Admitting: Internal Medicine

## 2019-05-04 DIAGNOSIS — I10 Essential (primary) hypertension: Secondary | ICD-10-CM

## 2019-05-04 DIAGNOSIS — I5042 Chronic combined systolic (congestive) and diastolic (congestive) heart failure: Secondary | ICD-10-CM

## 2019-05-04 MED ORDER — TORSEMIDE 20 MG PO TABS: ORAL_TABLET | ORAL | 0 refills | Status: DC

## 2019-05-25 DIAGNOSIS — J449 Chronic obstructive pulmonary disease, unspecified: Secondary | ICD-10-CM | POA: Diagnosis not present

## 2019-05-25 DIAGNOSIS — L03119 Cellulitis of unspecified part of limb: Secondary | ICD-10-CM | POA: Diagnosis not present

## 2019-06-09 ENCOUNTER — Other Ambulatory Visit: Payer: Self-pay | Admitting: Internal Medicine

## 2019-06-09 DIAGNOSIS — M1030 Gout due to renal impairment, unspecified site: Secondary | ICD-10-CM

## 2019-06-09 MED ORDER — ALLOPURINOL 100 MG PO TABS: ORAL_TABLET | ORAL | 0 refills | Status: DC

## 2019-06-25 DIAGNOSIS — J449 Chronic obstructive pulmonary disease, unspecified: Secondary | ICD-10-CM | POA: Diagnosis not present

## 2019-06-25 DIAGNOSIS — L03119 Cellulitis of unspecified part of limb: Secondary | ICD-10-CM | POA: Diagnosis not present

## 2019-06-30 ENCOUNTER — Ambulatory Visit: Payer: Medicare Other | Admitting: Internal Medicine

## 2019-07-02 ENCOUNTER — Encounter: Payer: Self-pay | Admitting: Internal Medicine

## 2019-07-02 ENCOUNTER — Other Ambulatory Visit (INDEPENDENT_AMBULATORY_CARE_PROVIDER_SITE_OTHER): Payer: Medicare Other

## 2019-07-02 ENCOUNTER — Telehealth: Payer: Self-pay | Admitting: Internal Medicine

## 2019-07-02 ENCOUNTER — Other Ambulatory Visit: Payer: Self-pay

## 2019-07-02 ENCOUNTER — Ambulatory Visit (INDEPENDENT_AMBULATORY_CARE_PROVIDER_SITE_OTHER): Payer: Medicare Other | Admitting: Internal Medicine

## 2019-07-02 VITALS — BP 160/76 | HR 65 | Temp 97.6°F | Resp 16 | Ht 60.0 in | Wt 207.0 lb

## 2019-07-02 DIAGNOSIS — D510 Vitamin B12 deficiency anemia due to intrinsic factor deficiency: Secondary | ICD-10-CM

## 2019-07-02 DIAGNOSIS — K5150 Left sided colitis without complications: Secondary | ICD-10-CM

## 2019-07-02 DIAGNOSIS — N1832 Chronic kidney disease, stage 3b: Secondary | ICD-10-CM | POA: Diagnosis not present

## 2019-07-02 DIAGNOSIS — R7303 Prediabetes: Secondary | ICD-10-CM

## 2019-07-02 DIAGNOSIS — D500 Iron deficiency anemia secondary to blood loss (chronic): Secondary | ICD-10-CM

## 2019-07-02 DIAGNOSIS — E559 Vitamin D deficiency, unspecified: Secondary | ICD-10-CM

## 2019-07-02 DIAGNOSIS — I480 Paroxysmal atrial fibrillation: Secondary | ICD-10-CM

## 2019-07-02 DIAGNOSIS — I10 Essential (primary) hypertension: Secondary | ICD-10-CM | POA: Diagnosis not present

## 2019-07-02 LAB — CBC WITH DIFFERENTIAL/PLATELET
Basophils Absolute: 0.1 10*3/uL (ref 0.0–0.1)
Basophils Relative: 0.9 % (ref 0.0–3.0)
Eosinophils Absolute: 0.3 10*3/uL (ref 0.0–0.7)
Eosinophils Relative: 2.4 % (ref 0.0–5.0)
HCT: 28.4 % — ABNORMAL LOW (ref 36.0–46.0)
Hemoglobin: 9 g/dL — ABNORMAL LOW (ref 12.0–15.0)
Lymphocytes Relative: 11.8 % — ABNORMAL LOW (ref 12.0–46.0)
Lymphs Abs: 1.3 10*3/uL (ref 0.7–4.0)
MCHC: 31.7 g/dL (ref 30.0–36.0)
MCV: 78.9 fl (ref 78.0–100.0)
Monocytes Absolute: 1 10*3/uL (ref 0.1–1.0)
Monocytes Relative: 9 % (ref 3.0–12.0)
Neutro Abs: 8.2 10*3/uL — ABNORMAL HIGH (ref 1.4–7.7)
Neutrophils Relative %: 75.9 % (ref 43.0–77.0)
Platelets: 241 10*3/uL (ref 150.0–400.0)
RBC: 3.59 Mil/uL — ABNORMAL LOW (ref 3.87–5.11)
RDW: 14.8 % (ref 11.5–15.5)
WBC: 10.8 10*3/uL — ABNORMAL HIGH (ref 4.0–10.5)

## 2019-07-02 LAB — BASIC METABOLIC PANEL
BUN: 27 mg/dL — ABNORMAL HIGH (ref 6–23)
CO2: 31 mEq/L (ref 19–32)
Calcium: 9.7 mg/dL (ref 8.4–10.5)
Chloride: 101 mEq/L (ref 96–112)
Creatinine, Ser: 1.18 mg/dL (ref 0.40–1.20)
GFR: 44.19 mL/min — ABNORMAL LOW (ref >60.00)
Glucose, Bld: 109 mg/dL — ABNORMAL HIGH (ref 70–99)
Potassium: 4.4 mEq/L (ref 3.5–5.1)
Sodium: 139 mEq/L (ref 135–145)

## 2019-07-02 LAB — VITAMIN B12: Vitamin B-12: 857 pg/mL (ref 211–911)

## 2019-07-02 LAB — FOLATE: Folate: 13.2 ng/mL

## 2019-07-02 LAB — IBC PANEL
Iron: 27 ug/dL — ABNORMAL LOW (ref 42–145)
Saturation Ratios: 7.2 % — ABNORMAL LOW (ref 20.0–50.0)
Transferrin: 269 mg/dL (ref 212.0–360.0)

## 2019-07-02 LAB — SEDIMENTATION RATE: Sed Rate: 66 mm/h — ABNORMAL HIGH (ref 0–30)

## 2019-07-02 LAB — VITAMIN D 25 HYDROXY (VIT D DEFICIENCY, FRACTURES): VITD: 35.09 ng/mL (ref 30.00–100.00)

## 2019-07-02 LAB — TSH: TSH: 2.35 u[IU]/mL (ref 0.35–4.50)

## 2019-07-02 LAB — FERRITIN: Ferritin: 23.9 ng/mL (ref 10.0–291.0)

## 2019-07-02 MED ORDER — PREDNISONE 2.5 MG PO TABS: 7.5000 mg | ORAL_TABLET | Freq: Two times a day (BID) | ORAL | 0 refills | Status: AC

## 2019-07-02 MED ORDER — PREDNISONE 10 MG PO TABS: 30.0000 mg | ORAL_TABLET | Freq: Two times a day (BID) | ORAL | 0 refills | Status: AC

## 2019-07-02 MED ORDER — PREDNISONE 5 MG PO TABS: 15.0000 mg | ORAL_TABLET | Freq: Two times a day (BID) | ORAL | 0 refills | Status: AC

## 2019-07-02 NOTE — Telephone Encounter (Signed)
Copied from [place] [id number]. Topic: Quick Communication - Rx Refill/Question >> Jul 02, 2019  2:54 PM Rainey Pines A wrote: Medication: Prednisone (Pharmacy called and is requesting a callback with  clarification on dosage that was sent over)  Has the patient contacted their pharmacy?Yes (Agent: If no, request that the patient contact the pharmacy for the refill.) (Agent: If yes, when and what did the pharmacy advise?)Contact PCP  Preferred Pharmacy (with phone number or street name): [facility] [place], [place] [phone] (Phone) [phone] (Fax)    Agent: Please be advised that RX refills may take up to 3 business days. We ask that you follow-up with your pharmacy.

## 2019-07-02 NOTE — Patient Instructions (Signed)
Anemia  Anemia is a condition in which you do not have enough red blood cells or hemoglobin. Hemoglobin is a substance in red blood cells that carries oxygen. When you do not have enough red blood cells or hemoglobin (are anemic), your body cannot get enough oxygen and your organs may not work properly. As a result, you may feel very tired or have other problems. What are the causes? Common causes of anemia include:  Excessive bleeding. Anemia can be caused by excessive bleeding inside or outside the body, including bleeding from the intestine or from periods in women.  Poor nutrition.  Long-lasting (chronic) kidney, thyroid, and liver disease.  Bone marrow disorders.  Cancer and treatments for cancer.  HIV (human immunodeficiency virus) and AIDS (acquired immunodeficiency syndrome).  Treatments for HIV and AIDS.  Spleen problems.  Blood disorders.  Infections, medicines, and autoimmune disorders that destroy red blood cells. What are the signs or symptoms? Symptoms of this condition include:  Minor weakness.  Dizziness.  Headache.  Feeling heartbeats that are irregular or faster than normal (palpitations).  Shortness of breath, especially with exercise.  Paleness.  Cold sensitivity.  Indigestion.  Nausea.  Difficulty sleeping.  Difficulty concentrating. Symptoms may occur suddenly or develop slowly. If your anemia is mild, you may not have symptoms. How is this diagnosed? This condition is diagnosed based on:  Blood tests.  Your medical history.  A physical exam.  Bone marrow biopsy. Your health care provider may also check your stool (feces) for blood and may do additional testing to look for the cause of your bleeding. You may also have other tests, including:  Imaging tests, such as a CT scan or MRI.  Endoscopy.  Colonoscopy. How is this treated? Treatment for this condition depends on the cause. If you continue to lose a lot of blood, you may  need to be treated at a hospital. Treatment may include:  Taking supplements of iron, vitamin S31, or folic acid.  Taking a hormone medicine (erythropoietin) that can help to stimulate red blood cell growth.  Having a blood transfusion. This may be needed if you lose a lot of blood.  Making changes to your diet.  Having surgery to remove your spleen. Follow these instructions at home:  Take over-the-counter and prescription medicines only as told by your health care provider.  Take supplements only as told by your health care provider.  Follow any diet instructions that you were given.  Keep all follow-up visits as told by your health care provider. This is important. Contact a health care provider if:  You develop new bleeding anywhere in the body. Get help right away if:  You are very weak.  You are short of breath.  You have pain in your abdomen or chest.  You are dizzy or feel faint.  You have trouble concentrating.  You have bloody or black, tarry stools.  You vomit repeatedly or you vomit up blood. Summary  Anemia is a condition in which you do not have enough red blood cells or enough of a substance in your red blood cells that carries oxygen (hemoglobin).  Symptoms may occur suddenly or develop slowly.  If your anemia is mild, you may not have symptoms.  This condition is diagnosed with blood tests as well as a medical history and physical exam. Other tests may be needed.  Treatment for this condition depends on the cause of the anemia. This information is not intended to replace advice given to you by  your health care provider. Make sure you discuss any questions you have with your health care provider. Document Released: 08/23/2004 Document Revised: 06/28/2017 Document Reviewed: 08/17/2016 Elsevier Patient Education  2020 [employer].

## 2019-07-02 NOTE — Progress Notes (Signed)
Subjective:  Patient ID: [name], female    DOB: September 19, 1940  Age: 78 y.o. MRN: [id number]  CC: Anemia, Hypertension, and Abdominal Pain  This visit occurred during the SARS-CoV-2 public health emergency.  Safety protocols were in place, including screening questions prior to the visit, additional usage of staff PPE, and extensive cleaning of exam room while observing appropriate contact time as indicated for disinfecting solutions.   HPI [name] presents for f/up - [name] complains of a 75-monthhistory of worsening GI symptoms.  [name] is intermittently passing about a tablespoon of blood and [name] has had abdominal pain and diarrhea.  [name] denies nausea, vomiting, loss of appetite, or weight loss.  Outpatient Medications Prior to Visit  Medication Sig Dispense Refill   acetaminophen (TYLENOL) 325 MG tablet Take 325-650 mg by mouth daily as needed for moderate pain, fever or headache.      albuterol (PROAIR HFA) 108 (90 Base) MCG/ACT inhaler Inhale 1-2 puffs into the lungs every 6 (six) hours as needed for wheezing or shortness of breath. 18 g 11   allopurinol (ZYLOPRIM) 100 MG tablet One po QD 90 tablet 0   Dietary Management Product (VASCULERA) TABS TAKE ONE TABLET BY MOUTH EVERY DAY 90 tablet 1   ferrous sulfate 325 (65 FE) MG tablet Take 1 tablet (325 mg total) by mouth daily with breakfast. 90 tablet 1   fluticasone furoate-vilanterol (BREO ELLIPTA) 100-25 MCG/INH AEPB INHALE 1 PUFF INTO THE LUNGS DAILY 60 each 1   isosorbide-hydrALAZINE (BIDIL) 20-37.5 MG tablet Take 1 tablet by mouth 3 (three) times daily. 270 tablet 0   torsemide (DEMADEX) 20 MG tablet TAKE 1 TABLET(20 MG) BY MOUTH TWICE DAILY 180 tablet 0   metoprolol tartrate (LOPRESSOR) 25 MG tablet Take 0.5 tablets (12.5 mg total) by mouth 2 (two) times daily. 90 tablet 3   No facility-administered medications prior to visit.     ROS Review of Systems  Constitutional: Positive for fatigue. Negative for  chills, diaphoresis and fever.  HENT: Negative.  Negative for trouble swallowing.   Eyes: Negative for visual disturbance.  Respiratory: Positive for shortness of breath.   Cardiovascular: Negative for chest pain, palpitations and leg swelling.  Gastrointestinal: Positive for abdominal pain and diarrhea. Negative for abdominal distention, blood in stool, constipation, nausea, rectal pain and vomiting.  Endocrine: Negative.   Genitourinary: Negative for decreased urine volume, difficulty urinating, dysuria and urgency.  Musculoskeletal: Negative.   Skin: Negative.  Negative for pallor.  Neurological: Negative.  Negative for dizziness, weakness, numbness and headaches.  Hematological: Negative for adenopathy. Does not bruise/bleed easily.  Psychiatric/Behavioral: Negative.     Objective:  BP (!) 160/76 (BP Location: Left Arm, Patient Position: Sitting, Cuff Size: Large)    Pulse 65    Temp 97.6 F (36.4 C) (Oral)    Resp 16    Ht 5' (1.524 m)    Wt 207 lb (93.9 kg)    SpO2 95%    BMI 40.43 kg/m   BP Readings from Last 3 Encounters:  07/02/19 (!) 160/76  12/29/18 (!) 184/50  09/24/18 (!) 160/40    Wt Readings from Last 3 Encounters:  07/02/19 207 lb (93.9 kg)  12/29/18 209 lb (94.8 kg)  09/24/18 206 lb (93.4 kg)    Physical Exam Vitals signs reviewed.  Constitutional:      General: [name] is not in acute distress.    Appearance: [name] is well-developed. [name] is obese. [name] is not ill-appearing, toxic-appearing or diaphoretic.  HENT:     Nose: Nose normal.     Mouth/Throat:     Mouth: Mucous membranes are moist.  Eyes:     General: No scleral icterus.    Conjunctiva/sclera: Conjunctivae normal.  Neck:     Musculoskeletal: Neck supple.  Cardiovascular:     Rate and Rhythm: Normal rate and regular rhythm.     Heart sounds: S1 normal and S2 normal. Murmur present. Systolic murmur present with a grade of 1/6. No gallop.   Pulmonary:     Effort: Pulmonary effort is normal.      Breath sounds: No stridor. No wheezing, rhonchi or rales.  Abdominal:     General: Abdomen is protuberant. Bowel sounds are normal. There is no distension.     Palpations: There is no hepatomegaly, splenomegaly or mass.     Tenderness: There is generalized abdominal tenderness. There is no guarding or rebound.  Musculoskeletal:     Right lower leg: No edema.     Left lower leg: No edema.  Lymphadenopathy:     Cervical: No cervical adenopathy.  Skin:    General: Skin is warm and dry.     Coloration: Skin is not pale.  Neurological:     General: No focal deficit present.     Mental Status: [name] is alert.  Psychiatric:        Mood and Affect: Mood normal.        Behavior: Behavior normal.     Lab Results  Component Value Date   WBC 10.8 (H) 07/02/2019   HGB 9.0 (L) 07/02/2019   HCT 28.4 (L) 07/02/2019   PLT 241.0 07/02/2019   GLUCOSE 109 (H) 07/02/2019   CHOL 121 12/29/2018   TRIG 97.0 12/29/2018   HDL 54.60 12/29/2018   LDLDIRECT 107.0 09/04/2016   LDLCALC 47 12/29/2018   ALT 10 08/04/2018   AST 16 08/04/2018   NA 139 07/02/2019   K 4.4 07/02/2019   CL 101 07/02/2019   CREATININE 1.18 07/02/2019   BUN 27 (H) 07/02/2019   CO2 31 07/02/2019   TSH 2.35 07/02/2019   INR 1.07 08/08/2017   HGBA1C 5.4 11/05/2017   MICROALBUR <0.7 11/05/2017    Mr Brain Wo Contrast  Result Date: 09/24/2018 CLINICAL DATA:  Recent onset headaches with double vision. EXAM: MRI HEAD WITHOUT CONTRAST TECHNIQUE: Multiplanar, multiecho pulse sequences of the brain and surrounding structures were obtained without intravenous contrast. COMPARISON:  Head CT 12/22/2006 FINDINGS: BRAIN: There is no acute infarct, acute hemorrhage, hydrocephalus or extra-axial collection. The midline structures are normal. No midline shift or other mass effect. Multifocal white matter hyperintensity, most commonly due to chronic ischemic microangiopathy. Prominent perivascular space of the right lentiform nucleus.  Generalized atrophy without lobar predilection. Susceptibility-sensitive sequences show no chronic microhemorrhage or superficial siderosis. VASCULAR: Major intracranial arterial and venous sinus flow voids are normal. SKULL AND UPPER CERVICAL SPINE: Calvarial bone marrow signal is normal. There is no skull base mass. Visualized upper cervical spine and soft tissues are normal. SINUSES/ORBITS: No fluid levels or advanced mucosal thickening. No mastoid or middle ear effusion. The orbits are normal. IMPRESSION: Generalized volume loss and mild chronic small vessel disease without acute intracranial abnormality. Electronically Signed   By: Ulyses Jarred M.D.   On: 09/24/2018 20:30    Assessment & Plan:   Damien was seen today for anemia, hypertension and abdominal pain.  Diagnoses and all orders for this visit:  Stage 3b chronic kidney disease- Her renal function is stable. -  Basic metabolic panel; Future  Prediabetes -     Basic metabolic panel; Future  Vitamin B12 deficiency anemia due to intrinsic factor deficiency- Her B12 and folate levels are normal. -     CBC with Differential; Future -     B12; Future -     Folate; Future -     Vitamin B1; Future  Iron deficiency anemia due to chronic blood loss-her H&H have decreased compared to 6 months ago and her iron level is low.  I recommended that [name] receive a series of iron infusions. -     CBC with Differential; Future -     IBC panel; Future -     Ferritin; Future -     Vitamin B1; Future  Vitamin D deficiency -     Vitamin D 25 hydroxy; Future  Essential hypertension- Considering her age and comorbid illnesses her blood pressure is adequately well controlled. -     Basic metabolic panel; Future -     TSH; Future  Paroxysmal atrial fibrillation ([place])- [name] is maintaining sinus rhythm with no palpitations.  Anticoagulation is contraindicated. -     TSH; Future  ULCERATIVE COLITIS-LEFT SIDE- [name] has suspicious symptoms,  worsening anemia, and an elevated sed rate.  I am concerned [name] is having a flareup of UC.  I recommended that [name] take a 9-day course of prednisone. -     Sedimentation rate; Future -     predniSONE (DELTASONE) 10 MG tablet; Take 3 tablets (30 mg total) by mouth 2 (two) times daily with a meal for 3 days. -     predniSONE (DELTASONE) 2.5 MG tablet; Take 3 tablets (7.5 mg total) by mouth 2 (two) times daily with a meal for 3 days. -     predniSONE (DELTASONE) 5 MG tablet; Take 3 tablets (15 mg total) by mouth 2 (two) times daily with a meal for 3 days.   I am having [name] start on predniSONE, predniSONE, and predniSONE. I am also having her maintain her albuterol, acetaminophen, metoprolol tartrate, Vasculera, ferrous sulfate, BiDil, Breo Ellipta, torsemide, and allopurinol.  Meds ordered this encounter  Medications   predniSONE (DELTASONE) 10 MG tablet    Sig: Take 3 tablets (30 mg total) by mouth 2 (two) times daily with a meal for 3 days.    Dispense:  18 tablet    Refill:  0   predniSONE (DELTASONE) 2.5 MG tablet    Sig: Take 3 tablets (7.5 mg total) by mouth 2 (two) times daily with a meal for 3 days.    Dispense:  18 tablet    Refill:  0   predniSONE (DELTASONE) 5 MG tablet    Sig: Take 3 tablets (15 mg total) by mouth 2 (two) times daily with a meal for 3 days.    Dispense:  18 tablet    Refill:  0     Follow-up: Return in about 4 weeks (around 07/30/2019).  [name], MD

## 2019-07-02 NOTE — Telephone Encounter (Signed)
Pharmacy contacted and informed that pt is to start off with the 10 mg dose for 3 days and then start the 5 mg dose for 3 days and then the 2.5 mg dose for 3 days.   Pharm Tech has noted the prescription.

## 2019-07-07 LAB — VITAMIN B1: Vitamin B1 (Thiamine): 11 nmol/L (ref 8–30)

## 2019-07-13 ENCOUNTER — Other Ambulatory Visit: Payer: Self-pay

## 2019-07-13 ENCOUNTER — Ambulatory Visit (HOSPITAL_COMMUNITY)
Admission: RE | Admit: 2019-07-13 | Discharge: 2019-07-13 | Disposition: A | Discharge status: Home / Self Care | Payer: Medicare Other | Source: Ambulatory Visit | Attending: Internal Medicine | Admitting: Internal Medicine

## 2019-07-13 DIAGNOSIS — D500 Iron deficiency anemia secondary to blood loss (chronic): Secondary | ICD-10-CM | POA: Insufficient documentation

## 2019-07-13 MED ORDER — SODIUM CHLORIDE 0.9 % IV SOLN
750.0000 mg | Freq: Once | INTRAVENOUS | Status: AC
  Administered 2019-07-13: 750 mg via INTRAVENOUS
  Filled 2019-07-13: qty 15

## 2019-07-13 MED ORDER — SODIUM CHLORIDE 0.9 % IV SOLN
INTRAVENOUS | Status: DC | PRN
  Administered 2019-07-13: 250 mL via INTRAVENOUS

## 2019-07-13 NOTE — Progress Notes (Signed)
PATIENT CARE CENTER NOTE  Diagnosis: Iron deficiency anemia due to chronic blood loss (D50.0)   Provider: [name], MD   Procedure: Christeen Douglas IV   Note: Patient received Injectafer via PIV. Tolerated well with no adverse reaction. Patient observed for 30 minute post-infusion. Pre and post infusion BP elevated. Per patient, BP runs high at baseline. Discharge instructions given. Patient to come back next week for second iron infusion. Alert, oriented and ambulatory at discharge.

## 2019-07-13 NOTE — Discharge Instructions (Signed)

## 2019-07-20 ENCOUNTER — Other Ambulatory Visit: Payer: Self-pay

## 2019-07-20 ENCOUNTER — Ambulatory Visit (HOSPITAL_COMMUNITY)
Admission: RE | Admit: 2019-07-20 | Discharge: 2019-07-20 | Disposition: A | Discharge status: Home / Self Care | Payer: Medicare Other | Source: Ambulatory Visit | Attending: Internal Medicine | Admitting: Internal Medicine

## 2019-07-20 DIAGNOSIS — D500 Iron deficiency anemia secondary to blood loss (chronic): Secondary | ICD-10-CM | POA: Diagnosis not present

## 2019-07-20 MED ORDER — SODIUM CHLORIDE 0.9 % IV SOLN
INTRAVENOUS | Status: DC | PRN
  Administered 2019-07-20: 250 mL via INTRAVENOUS

## 2019-07-20 MED ORDER — SODIUM CHLORIDE 0.9 % IV SOLN
750.0000 mg | Freq: Once | INTRAVENOUS | Status: AC
  Administered 2019-07-20: 11:00:00 750 mg via INTRAVENOUS
  Filled 2019-07-20: qty 15

## 2019-07-20 NOTE — Progress Notes (Addendum)
PATIENT CARE CENTER NOTE  Diagnosis: Iron deficiency anemia due to chronic blood loss (D50.0)   Provider: [name], MD   Procedure: Christeen Douglas IV   Note: Patient received Injectafer via PIV. Tolerated well with no adverse reaction. Patient observed for 30 minute post-infusion. Vital signs stable. Discharge instructions given. Alert, oriented and ambulatory at discharge.

## 2019-07-20 NOTE — Discharge Instructions (Signed)

## 2019-07-25 DIAGNOSIS — J449 Chronic obstructive pulmonary disease, unspecified: Secondary | ICD-10-CM | POA: Diagnosis not present

## 2019-07-25 DIAGNOSIS — L03119 Cellulitis of unspecified part of limb: Secondary | ICD-10-CM | POA: Diagnosis not present

## 2019-08-03 ENCOUNTER — Other Ambulatory Visit: Payer: Self-pay | Admitting: Internal Medicine

## 2019-08-03 DIAGNOSIS — I10 Essential (primary) hypertension: Secondary | ICD-10-CM

## 2019-08-03 DIAGNOSIS — I5042 Chronic combined systolic (congestive) and diastolic (congestive) heart failure: Secondary | ICD-10-CM

## 2019-08-03 MED ORDER — TORSEMIDE 20 MG PO TABS: ORAL_TABLET | ORAL | 0 refills | Status: DC

## 2019-08-25 DIAGNOSIS — J449 Chronic obstructive pulmonary disease, unspecified: Secondary | ICD-10-CM | POA: Diagnosis not present

## 2019-08-25 DIAGNOSIS — L03119 Cellulitis of unspecified part of limb: Secondary | ICD-10-CM | POA: Diagnosis not present

## 2019-09-25 DIAGNOSIS — L03119 Cellulitis of unspecified part of limb: Secondary | ICD-10-CM | POA: Diagnosis not present

## 2019-09-25 DIAGNOSIS — J449 Chronic obstructive pulmonary disease, unspecified: Secondary | ICD-10-CM | POA: Diagnosis not present

## 2019-10-23 DIAGNOSIS — J449 Chronic obstructive pulmonary disease, unspecified: Secondary | ICD-10-CM | POA: Diagnosis not present

## 2019-10-23 DIAGNOSIS — L03119 Cellulitis of unspecified part of limb: Secondary | ICD-10-CM | POA: Diagnosis not present

## 2019-10-25 ENCOUNTER — Other Ambulatory Visit: Payer: Self-pay | Admitting: Internal Medicine

## 2019-10-25 DIAGNOSIS — I10 Essential (primary) hypertension: Secondary | ICD-10-CM

## 2019-10-25 DIAGNOSIS — I5042 Chronic combined systolic (congestive) and diastolic (congestive) heart failure: Secondary | ICD-10-CM

## 2019-10-25 DIAGNOSIS — J411 Mucopurulent chronic bronchitis: Secondary | ICD-10-CM

## 2019-11-02 ENCOUNTER — Other Ambulatory Visit: Payer: Self-pay | Admitting: Interventional Cardiology

## 2019-11-03 MED ORDER — METOPROLOL TARTRATE 25 MG PO TABS: 12.5000 mg | ORAL_TABLET | Freq: Two times a day (BID) | ORAL | 0 refills | Status: DC

## 2019-11-04 ENCOUNTER — Other Ambulatory Visit: Payer: Self-pay | Admitting: Internal Medicine

## 2019-11-04 MED ORDER — METOPROLOL TARTRATE 25 MG PO TABS: 12.5000 mg | ORAL_TABLET | Freq: Two times a day (BID) | ORAL | 0 refills | Status: DC

## 2019-11-04 NOTE — Addendum Note (Signed)
Addended by: Derl Barrow on: 11/04/2019 10:41 AM   Modules accepted: Orders

## 2019-11-11 ENCOUNTER — Other Ambulatory Visit: Payer: Self-pay | Admitting: Internal Medicine

## 2019-11-11 DIAGNOSIS — I5042 Chronic combined systolic (congestive) and diastolic (congestive) heart failure: Secondary | ICD-10-CM

## 2019-11-12 ENCOUNTER — Encounter: Payer: Self-pay | Admitting: Internal Medicine

## 2019-11-12 ENCOUNTER — Other Ambulatory Visit: Payer: Self-pay

## 2019-11-12 ENCOUNTER — Other Ambulatory Visit: Payer: Medicare Other

## 2019-11-12 ENCOUNTER — Ambulatory Visit (INDEPENDENT_AMBULATORY_CARE_PROVIDER_SITE_OTHER): Payer: Medicare Other | Admitting: Internal Medicine

## 2019-11-12 VITALS — BP 152/72 | HR 72 | Temp 98.1°F | Resp 16 | Ht 60.0 in | Wt 206.4 lb

## 2019-11-12 DIAGNOSIS — D500 Iron deficiency anemia secondary to blood loss (chronic): Secondary | ICD-10-CM

## 2019-11-12 DIAGNOSIS — D510 Vitamin B12 deficiency anemia due to intrinsic factor deficiency: Secondary | ICD-10-CM

## 2019-11-12 DIAGNOSIS — N1832 Chronic kidney disease, stage 3b: Secondary | ICD-10-CM | POA: Diagnosis not present

## 2019-11-12 DIAGNOSIS — I480 Paroxysmal atrial fibrillation: Secondary | ICD-10-CM

## 2019-11-12 DIAGNOSIS — R079 Chest pain, unspecified: Secondary | ICD-10-CM | POA: Diagnosis not present

## 2019-11-12 LAB — CBC WITH DIFFERENTIAL/PLATELET
Basophils Absolute: 0.1 10*3/uL (ref 0.0–0.1)
Basophils Relative: 0.8 % (ref 0.0–3.0)
Eosinophils Absolute: 0.2 10*3/uL (ref 0.0–0.7)
Eosinophils Relative: 1.8 % (ref 0.0–5.0)
HCT: 30.6 % — ABNORMAL LOW (ref 36.0–46.0)
Hemoglobin: 10 g/dL — ABNORMAL LOW (ref 12.0–15.0)
Lymphocytes Relative: 15.5 % (ref 12.0–46.0)
Lymphs Abs: 1.7 10*3/uL (ref 0.7–4.0)
MCHC: 32.8 g/dL (ref 30.0–36.0)
MCV: 81.3 fl (ref 78.0–100.0)
Monocytes Absolute: 1.1 10*3/uL — ABNORMAL HIGH (ref 0.1–1.0)
Monocytes Relative: 9.7 % (ref 3.0–12.0)
Neutro Abs: 8 10*3/uL — ABNORMAL HIGH (ref 1.4–7.7)
Neutrophils Relative %: 72.2 % (ref 43.0–77.0)
Platelets: 220 10*3/uL (ref 150.0–400.0)
RBC: 3.76 Mil/uL — ABNORMAL LOW (ref 3.87–5.11)
RDW: 14.5 % (ref 11.5–15.5)
WBC: 11.1 10*3/uL — ABNORMAL HIGH (ref 4.0–10.5)

## 2019-11-12 NOTE — Progress Notes (Signed)
Subjective:  Patient ID: [name], female    DOB: 11-11-1940  Age: 79 y.o. MRN: [id number]  CC: Anemia  This visit occurred during the SARS-CoV-2 public health emergency.  Safety protocols were in place, including screening questions prior to the visit, additional usage of staff PPE, and extensive cleaning of exam room while observing appropriate contact time as indicated for disinfecting solutions.    HPI [name] presents for f/up - She tells me she is in her usual state of health with chronic sharp CP that she describes as an intermittent stabbing sensation, intermittent abdominal pain related to a hernia that cannot be surgically repaired, and shortness of breath.  She received iron infusions about 4 months ago.  She is not currently taking an iron supplement.  She is not recently seen any episodes of bright red blood per rectum or melena.  Outpatient Medications Prior to Visit  Medication Sig Dispense Refill  . acetaminophen (TYLENOL) 325 MG tablet Take 325-650 mg by mouth daily as needed for moderate pain, fever or headache.     . albuterol (PROAIR HFA) 108 (90 Base) MCG/ACT inhaler Inhale 1-2 puffs into the lungs every 6 (six) hours as needed for wheezing or shortness of breath. 18 g 11  . allopurinol (ZYLOPRIM) 100 MG tablet One po QD 90 tablet 0  . BIDIL 20-37.5 MG tablet TAKE 1 TABLET BY MOUTH THREE TIMES DAILY 270 tablet 0  . BREO ELLIPTA 100-25 MCG/INH AEPB INHALE 1 PUFF INTO THE LUNGS DAILY 90 each 1  . Dietary Management Product (VASCULERA) TABS TAKE ONE TABLET BY MOUTH EVERY DAY 90 tablet 1  . metoprolol tartrate (LOPRESSOR) 25 MG tablet Take 0.5 tablets (12.5 mg total) by mouth 2 (two) times daily. Please make overdue appt with Dr. Rayann Heman before anymore refills. 1st attempt 30 tablet 0  . torsemide (DEMADEX) 20 MG tablet TAKE 1 TABLET(20 MG) BY MOUTH TWICE DAILY 180 tablet 0  . ferrous sulfate 325 (65 FE) MG tablet Take 1 tablet (325 mg total) by mouth daily with  breakfast. 90 tablet 1   No facility-administered medications prior to visit.    ROS Review of Systems  Constitutional: Negative for diaphoresis, fatigue and unexpected weight change.  HENT: Negative.  Negative for trouble swallowing.   Respiratory: Positive for shortness of breath. Negative for cough, chest tightness and wheezing.   Cardiovascular: Positive for chest pain and palpitations. Negative for leg swelling.  Gastrointestinal: Positive for abdominal distention and abdominal pain. Negative for blood in stool, constipation, diarrhea, nausea and vomiting.  Genitourinary: Negative.  Negative for difficulty urinating.  Musculoskeletal: Negative.   Skin: Negative.   Neurological: Positive for numbness. Negative for dizziness, weakness, light-headedness and headaches.       She has chronic, unchanged numbness and tingling in all of her extremities.  Hematological: Negative for adenopathy. Does not bruise/bleed easily.  Psychiatric/Behavioral: Negative.     Objective:  BP (!) 152/72 (BP Location: Left Arm, Patient Position: Sitting, Cuff Size: Large)   Pulse 72   Temp 98.1 F (36.7 C) (Oral)   Resp 16   Ht 5' (1.524 m)   Wt 206 lb 6 oz (93.6 kg)   SpO2 94%   BMI 40.30 kg/m   BP Readings from Last 3 Encounters:  11/12/19 (!) 152/72  07/20/19 (!) 151/45  07/13/19 (!) 172/54    Wt Readings from Last 3 Encounters:  11/12/19 206 lb 6 oz (93.6 kg)  07/02/19 207 lb (93.9 kg)  12/29/18  209 lb (94.8 kg)    Physical Exam Vitals reviewed.  Constitutional:      General: She is not in acute distress.    Appearance: She is obese. She is ill-appearing (02 dependent). She is not toxic-appearing.  HENT:     Nose: Nose normal.     Mouth/Throat:     Mouth: Mucous membranes are moist. Mucous membranes are pale.  Eyes:     General: No scleral icterus.    Conjunctiva/sclera: Conjunctivae normal.  Cardiovascular:     Rate and Rhythm: Bradycardia present.     Pulses: Normal  pulses.     Heart sounds: No murmur.     Comments: EKG -  Sinus bradycardia LVH w repol No changes from the prior EKGs  Pulmonary:     Effort: Pulmonary effort is normal.     Breath sounds: No stridor. No wheezing, rhonchi or rales.  Abdominal:     General: Abdomen is protuberant. There is distension.     Palpations: Abdomen is soft. There is no hepatomegaly, splenomegaly or mass.     Tenderness: There is generalized abdominal tenderness. There is no guarding or rebound.     Hernia: A hernia is present. Hernia is present in the ventral area.  Musculoskeletal:        General: Normal range of motion.     Cervical back: Neck supple.  Lymphadenopathy:     Cervical: No cervical adenopathy.  Skin:    General: Skin is warm.  Neurological:     General: No focal deficit present.     Mental Status: She is alert.  Psychiatric:        Mood and Affect: Mood normal.        Behavior: Behavior normal.     Lab Results  Component Value Date   WBC 11.1 (H) 11/12/2019   HGB 10.0 (L) 11/12/2019   HCT 30.6 (L) 11/12/2019   PLT 220.0 11/12/2019   GLUCOSE 97 11/12/2019   CHOL 121 12/29/2018   TRIG 97.0 12/29/2018   HDL 54.60 12/29/2018   LDLDIRECT 107.0 09/04/2016   LDLCALC 47 12/29/2018   ALT 10 08/04/2018   AST 16 08/04/2018   NA 140 11/12/2019   K 4.7 11/12/2019   CL 101 11/12/2019   CREATININE 1.36 (H) 11/12/2019   BUN 36 (H) 11/12/2019   CO2 30 11/12/2019   TSH 2.35 07/02/2019   INR 1.07 08/08/2017   HGBA1C 5.4 11/05/2017   MICROALBUR <0.7 11/05/2017    No results found.  Assessment & Plan:   [name] was seen today for anemia.  Diagnoses and all orders for this visit:  Stage 3b chronic kidney disease- Her blood pressure is adequately well controlled.  I will treat the iron deficiency anemia. -     Basic metabolic panel; Future  Vitamin B12 deficiency anemia due to intrinsic factor deficiency- She is anemic but her B12 level is normal with high-dose, oral B12  supplementation. -     CBC with Differential/Platelet; Future -     Vitamin B12; Future -     Folate; Future  Iron deficiency anemia due to chronic blood loss- She is anemic and her iron level is very, very low.  I recommended she restart oral iron replacement therapy and have also ordered iron infusions. -     CBC with Differential/Platelet; Future -     IBC panel; Future -     Ferritin; Future -     ferrous sulfate 325 (65 FE) MG tablet;  Take 1 tablet (325 mg total) by mouth 2 (two) times daily with a meal.  Paroxysmal atrial fibrillation ([place])- She has good rate and rhythm control.  Anticoagulation is contraindicated.  Chest pain, unspecified type- Her EKG is normal and her heart rate is low.  I do not think her chest pain is related to cardiopulmonary disease.  Her pain is consistent with atypical causes like musculoskeletal pain and GI discomfort. -     EKG 12-Lead   I have changed Larry Sierras. Winger's ferrous sulfate. I am also having her maintain her albuterol, acetaminophen, Vasculera, allopurinol, torsemide, Breo Ellipta, metoprolol tartrate, and BiDil.  Meds ordered this encounter  Medications  . ferrous sulfate 325 (65 FE) MG tablet    Sig: Take 1 tablet (325 mg total) by mouth 2 (two) times daily with a meal.    Dispense:  180 tablet    Refill:  1   I spent 50 minutes in preparing to see the patient by review of recent labs, imaging and procedures, obtaining and reviewing separately obtained history, communicating with the patient and family or caregiver, ordering medications, tests or procedures, and documenting clinical information in the EHR including the differential Dx, treatment, and any further evaluation and other management of 1. Stage 3b chronic kidney disease 2. Vitamin B12 deficiency anemia due to intrinsic factor deficiency 3. Iron deficiency anemia due to chronic blood loss 4. Paroxysmal atrial fibrillation ([facility]) 5. Chest pain, unspecified type       Follow-up: Return in about 4 months (around 03/13/2020).  [name], MD

## 2019-11-12 NOTE — Patient Instructions (Signed)
Anemia  Anemia is a condition in which you do not have enough red blood cells or hemoglobin. Hemoglobin is a substance in red blood cells that carries oxygen. When you do not have enough red blood cells or hemoglobin (are anemic), your body cannot get enough oxygen and your organs may not work properly. As a result, you may feel very tired or have other problems. What are the causes? Common causes of anemia include:  Excessive bleeding. Anemia can be caused by excessive bleeding inside or outside the body, including bleeding from the intestine or from periods in women.  Poor nutrition.  Long-lasting (chronic) kidney, thyroid, and liver disease.  Bone marrow disorders.  Cancer and treatments for cancer.  HIV (human immunodeficiency virus) and AIDS (acquired immunodeficiency syndrome).  Treatments for HIV and AIDS.  Spleen problems.  Blood disorders.  Infections, medicines, and autoimmune disorders that destroy red blood cells. What are the signs or symptoms? Symptoms of this condition include:  Minor weakness.  Dizziness.  Headache.  Feeling heartbeats that are irregular or faster than normal (palpitations).  Shortness of breath, especially with exercise.  Paleness.  Cold sensitivity.  Indigestion.  Nausea.  Difficulty sleeping.  Difficulty concentrating. Symptoms may occur suddenly or develop slowly. If your anemia is mild, you may not have symptoms. How is this diagnosed? This condition is diagnosed based on:  Blood tests.  Your medical history.  A physical exam.  Bone marrow biopsy. Your health care provider may also check your stool (feces) for blood and may do additional testing to look for the cause of your bleeding. You may also have other tests, including:  Imaging tests, such as a CT scan or MRI.  Endoscopy.  Colonoscopy. How is this treated? Treatment for this condition depends on the cause. If you continue to lose a lot of blood, you may  need to be treated at a hospital. Treatment may include:  Taking supplements of iron, vitamin S31, or folic acid.  Taking a hormone medicine (erythropoietin) that can help to stimulate red blood cell growth.  Having a blood transfusion. This may be needed if you lose a lot of blood.  Making changes to your diet.  Having surgery to remove your spleen. Follow these instructions at home:  Take over-the-counter and prescription medicines only as told by your health care provider.  Take supplements only as told by your health care provider.  Follow any diet instructions that you were given.  Keep all follow-up visits as told by your health care provider. This is important. Contact a health care provider if:  You develop new bleeding anywhere in the body. Get help right away if:  You are very weak.  You are short of breath.  You have pain in your abdomen or chest.  You are dizzy or feel faint.  You have trouble concentrating.  You have bloody or black, tarry stools.  You vomit repeatedly or you vomit up blood. Summary  Anemia is a condition in which you do not have enough red blood cells or enough of a substance in your red blood cells that carries oxygen (hemoglobin).  Symptoms may occur suddenly or develop slowly.  If your anemia is mild, you may not have symptoms.  This condition is diagnosed with blood tests as well as a medical history and physical exam. Other tests may be needed.  Treatment for this condition depends on the cause of the anemia. This information is not intended to replace advice given to you by  your health care provider. Make sure you discuss any questions you have with your health care provider. Document Revised: 06/28/2017 Document Reviewed: 08/17/2016 Elsevier Patient Education  [employer].

## 2019-11-13 ENCOUNTER — Encounter: Payer: Self-pay | Admitting: Internal Medicine

## 2019-11-13 LAB — BASIC METABOLIC PANEL
BUN: 36 mg/dL — ABNORMAL HIGH (ref 6–23)
CO2: 30 mEq/L (ref 19–32)
Calcium: 9.6 mg/dL (ref 8.4–10.5)
Chloride: 101 mEq/L (ref 96–112)
Creatinine, Ser: 1.36 mg/dL — ABNORMAL HIGH (ref 0.40–1.20)
GFR: 37.48 mL/min — ABNORMAL LOW (ref >60.00)
Glucose, Bld: 97 mg/dL (ref 70–99)
Potassium: 4.7 mEq/L (ref 3.5–5.1)
Sodium: 140 mEq/L (ref 135–145)

## 2019-11-13 LAB — VITAMIN B12: Vitamin B-12: 452 pg/mL (ref 211–911)

## 2019-11-13 LAB — IBC PANEL
Iron: 2 ug/dL — ABNORMAL LOW (ref 42–145)
Saturation Ratios: 0.5 % — ABNORMAL LOW (ref 20.0–50.0)
Transferrin: 261 mg/dL (ref 212.0–360.0)

## 2019-11-13 LAB — FERRITIN: Ferritin: 36.9 ng/mL (ref 10.0–291.0)

## 2019-11-13 LAB — FOLATE: Folate: 10.2 ng/mL (ref >5.9)

## 2019-11-13 MED ORDER — FERROUS SULFATE 325 (65 FE) MG PO TABS: 325.0000 mg | ORAL_TABLET | Freq: Two times a day (BID) | ORAL | 1 refills | Status: AC

## 2019-11-15 NOTE — Progress Notes (Signed)
Cardiology Office Note Date:  11/17/2019  Patient ID:  Laurie, [name] [birth year], MRN [id number] PCP:  [name], MD  Cardiologist:  Dr. Irish Lack Electrophysiologist: Dr. Rayann Heman    Chief Complaint:    over due visit  History of Present Illness: [name] is a 79 y.o. female with history of chronic CHF (diastolic), COPD (chronic obstructive pulmonary disease) with home O2, hx of cor-pulmonale, OSA, intolerant of CPAP, GERD, HTN, HLD, chronic venous stasis, ulcerative colitis, GI AVMs,  CKD (III), morbid obesity, eczema, AFib.  He comes in today to be seen for Dr. Rayann Heman.  Last seen by him in Aug 2019, no reported symptoms, metoprolol was reduced 2/2 bradycardia, noting that the patient was very clear that she did not want a ppm, and felt like likely could be avoided.  Mentioned not on a/c 2/2 hx of GI bleeding.  In review of Dr. Hassell Done last visit, perhaps a component of tachy-brday by history.  I saw her Nov 2019, she reported  doing well. She had self resumed her lopressor at 28m BID with worries that her HR would get to fast, this making her feel uncomfortable and states that she had only noted on a couple of occassions HR 40's previously.  She weighed every morning, checks her BP, O2 sat, and HR as well and then her HR periodically through the day.  She denied any symptoms of brady since the reduction of the lopressor from 563mBID > 2545mID. No CP, no dizziness, near syncope or syncope.  She had baseline SOB, did not tend to use O2 when out and about, but wears it routinely at home.  This her routine for years.  No SOB/DOE outside of her baseline. We reduced her lopressor back to 12.5mg45mD, she was following her lipids with her PMD.  This was the last visit with us  Koreaspitalized 07/2018 with hematochezia and a second with concerns of bowel obstruction, did not have surgery.  She saw her PMD last week, mentions some intermittent belly pain, unable to have hernia  surgery, no further bleeding.  Planned to manage iron def anemia, chronic blood loss.  Discussed atypical CP, musculoskeletal.  TODAY She is doing "OK".  Continues with intermittent low belly pain as she discussed with her PMD, BM ore very painful.  No recurrent bleeding. She cares for her adult daughter who suffered a stroke, no exercise, but is able to do her ADLs, and care for her daughter. She says she has palpitations that are notable and bothersome about once a year, feels her heart rate "all over the place", lasts a day, then simmers down. She will occasionally maybe once a month, find her HR 40's, this is brief with a feeling of a bit "swimmy headed", never any near syncope or syncope. Her CP is a fleeting stabbing that is rare and random She wears O2 at [name], no symptoms of PND or orthopnea.  NO rest SOB, no unusual DOE.      Past Medical History:  Diagnosis Date  . Allergy   . Angiodysplasia of cecum   . Angiodysplasia of duodenum   . Arthritis   . Asteatotic eczema 02/25/2016  . Asthma   . Atrial fibrillation (HCC)[place]21/2019  . Blood transfusion without reported diagnosis   . Cataract   . Chest pain at rest 03/22/2010   Qualifier: Diagnosis of  By: JoneRonnald Ramp ThomArvid Right. CHF (congestive heart failure) (HCC)[place]1/05  . CHF (  congestive heart failure), NYHA class III, chronic, combined ([place]) 08/10/2017  . Chronic venous stasis dermatitis of both lower extremities 06/02/2013  . CKD (chronic kidney disease), stage III 06/20/2010   Estimated Creatinine Clearance: 34.8 mL/min (A) (by C-G formula based on SCr of 1.37 mg/dL (H)).   Marland Kitchen COLD (chronic obstructive lung disease) ([place])   . Colonic polyp 02/16/2008   Tubular adenoma  . COPD (chronic obstructive pulmonary disease) ([place]) 01/27/98  . Cor pulmonale ([place]) 11/16/2014  . D-dimer, elevated 01/01/2018  . GERD 07/30/1992  . GERD (gastroesophageal reflux disease) 07/30/92  . Gout   . Heart murmur   . HTN (hypertension) 07/30/1988  .  Hyperlipidemia 11/27/96  . Hyperlipidemia with target LDL less than 100 11/27/1996       . Kidney stone 1960, 1972, 1991  . MGUS (monoclonal gammopathy of unknown significance) 11/08/2016  . Morbid obesity ([place]) 04/19/2010   She agrees to work on her lifestyle modifications to help her lose weight.   . Neck pain on right side 01/01/2018  . OSA (obstructive sleep apnea) 06/22/2013  . Osteopenia, senile 02/05/2013   July 2014  -2.1 left femur -1.9 left forearm   . Other screening mammogram 02/05/2013  . Oxygen deficiency   . Prediabetes 11/13/2006  . Renal insufficiency   . Stenosis of cervical spine with myelopathy ([place]) 01/01/2018  . Ulcer of leg, chronic, right ([place]) 10/10/2011  . Ulcerative colitis   . ULCERATIVE COLITIS-LEFT SIDE 03/19/2008       . Vitamin B12 deficiency anemia 11/13/2006       . Vitamin D deficiency 02/06/2013    Past Surgical History:  Procedure Laterality Date  . BRONCHOSCOPY    . COLONOSCOPY    . COLONOSCOPY WITH PROPOFOL N/A 02/23/2015   Procedure: COLONOSCOPY WITH PROPOFOL;  Surgeon: Ladene Artist, MD;  Location: [facility];  Service: Endoscopy;  Laterality: N/A;  . COLONOSCOPY WITH PROPOFOL N/A 08/12/2018   Procedure: COLONOSCOPY WITH PROPOFOL;  Surgeon: Ladene Artist, MD;  Location: [facility];  Service: Endoscopy;  Laterality: N/A;  . ESOPHAGOGASTRODUODENOSCOPY (EGD) WITH PROPOFOL N/A 02/23/2015   Procedure: ESOPHAGOGASTRODUODENOSCOPY (EGD) WITH PROPOFOL;  Surgeon: Ladene Artist, MD;  Location: [facility];  Service: Endoscopy;  Laterality: N/A;  . ESOPHAGOGASTRODUODENOSCOPY (EGD) WITH PROPOFOL N/A 08/12/2018   Procedure: ESOPHAGOGASTRODUODENOSCOPY (EGD) WITH PROPOFOL;  Surgeon: Ladene Artist, MD;  Location: [facility];  Service: Endoscopy;  Laterality: N/A;  . HOT HEMOSTASIS N/A 08/12/2018   Procedure: HOT HEMOSTASIS (ARGON PLASMA COAGULATION/BICAP);  Surgeon: Ladene Artist, MD;  Location: [facility];  Service: Endoscopy;  Laterality: N/A;  EGD and  Colon APC  . NO PAST SURGERIES    . POLYPECTOMY      Current Outpatient Medications  Medication Sig Dispense Refill  . acetaminophen (TYLENOL) 325 MG tablet Take 325-650 mg by mouth daily as needed for moderate pain, fever or headache.     . albuterol (PROAIR HFA) 108 (90 Base) MCG/ACT inhaler Inhale 1-2 puffs into the lungs every 6 (six) hours as needed for wheezing or shortness of breath. 18 g 11  . allopurinol (ZYLOPRIM) 100 MG tablet One po QD 90 tablet 0  . BIDIL 20-37.5 MG tablet TAKE 1 TABLET BY MOUTH THREE TIMES DAILY 270 tablet 0  . BREO ELLIPTA 100-25 MCG/INH AEPB INHALE 1 PUFF INTO THE LUNGS DAILY 90 each 1  . Dietary Management Product (VASCULERA) TABS TAKE ONE TABLET BY MOUTH EVERY DAY 90 tablet 1  . ferrous sulfate 325 (65 FE)  MG tablet Take 1 tablet (325 mg total) by mouth 2 (two) times daily with a meal. 180 tablet 1  . metoprolol tartrate (LOPRESSOR) 25 MG tablet Take 0.5 tablets (12.5 mg total) by mouth 2 (two) times daily. 90 tablet 1  . torsemide (DEMADEX) 20 MG tablet TAKE 1 TABLET(20 MG) BY MOUTH TWICE DAILY 180 tablet 0   No current facility-administered medications for this visit.    Allergies:   Amoxicillin, Celebrex [celecoxib], Enalapril, Lipitor [atorvastatin], Amlodipine besylate, Codeine sulfate, and Hydrocodone-acetaminophen   Social History:  The patient  reports that she quit smoking about 23 years ago. She has never used smokeless tobacco. She reports that she does not drink alcohol or use drugs.   Family History:  The patient's family history includes Cervical cancer in her sister; Clotting disorder in her sister; Crohn's disease in her brother; Diabetes in her mother and sister; Heart attack in her sister; Heart disease in her brother, brother, mother, and sister; Kidney cancer in her mother; Lung cancer in her sister; Stomach cancer in her mother; Stroke in her father and mother.  ROS:  Please see the history of present illness.  All other systems are  reviewed and otherwise negative.   PHYSICAL EXAM:  VS:  BP (!) 188/58   Pulse 78   Ht 5' (1.524 m)   Wt 206 lb (93.4 kg)   SpO2 94%   BMI 40.23 kg/m  BMI: Body mass index is 40.23 kg/m. Well nourished, well developed, in no acute distress  HEENT: normocephalic, atraumatic  Neck: no JVD, carotid bruits or masses Cardiac:  RRR; 2/6 SM, no rubs, or gallops Lungs: CTA b/l, no wheezing, rhonchi or rales  Abd: soft, nontender MS: no deformity, age appropriate atrophy Ext: 1++ edema, chronic looking skin changes  Skin: warm and dry, no rash Neuro:  No gross deficits appreciated Psych: euthymic mood, full affect   EKG:  Done today and reviewed by myself:  SR 69bpm   10/18/17: TTE Study Conclusions - Left ventricle: The cavity size was normal. There was moderate   concentric hypertrophy. Systolic function was vigorous. The   estimated ejection fraction was in the range of 65% to 70%. Wall   motion was normal; there were no regional wall motion   abnormalities. The study is not technically sufficient to allow   evaluation of LV diastolic function. - Aortic valve: Trileaflet; mildly thickened, mildly calcified   leaflets. - Mitral valve: There was mild regurgitation.    Recent Labs: 07/02/2019: TSH 2.35 11/12/2019: BUN 36; Creatinine, Ser 1.36; Hemoglobin 10.0; Platelets 220.0; Potassium 4.7; Sodium 140  12/29/2018: Cholesterol 121; HDL 54.60; LDL Cholesterol 47; Total CHOL/HDL Ratio 2; Triglycerides 97.0; VLDL 19.4   Estimated Creatinine Clearance: 34.3 mL/min (A) (by C-G formula based on SCr of 1.36 mg/dL (H)).   Wt Readings from Last 3 Encounters:  11/17/19 206 lb (93.4 kg)  11/12/19 206 lb 6 oz (93.6 kg)  07/02/19 207 lb (93.9 kg)     Other studies reviewed: Additional studies/records reviewed today include: summarized above  ASSESSMENT AND PLAN:  1. Bradycardia     Has been largely asymptomatic, patient had wanted to avoid PPM     We re-visited her symptoms,  frequency, severity     Again, if able would prefer not to have pacer if not absolutely necessary     She was told she was too ill to have abdominal surgery, discussed pacing was less involved, but not without risks.  For now, will  hold without changes.  She would like to see Dr. Rayann Heman in a few months.    2. HTN     She left without taking her medicines this AM     Asked to keep an eye on her her BP at home, she felt like he machine may not be accurate.     At her PMD visit initially was high but a recheck was better, this is how it behaves at home as well.   3. Paroxysmal AFib     CHA2DS2Vasc is 4     not on a/c 2/2 GI bleeds     AF burden sounds about once a year   4. Edema 5. Chronic CHF (diastolic)     Chronic venous stasis, no wounds noted     No unusual SOB, lungs are clear     at her baseline (by her estimation)   6. HLD     Not addressed today        Disposition: Dr. Rayann Heman in 3 months, sooner if needed.    Current medicines are reviewed at length with the patient today.  The patient did not have any concerns regarding medicines.  Laurie Night, PA-C 11/17/2019 10:56 AM     [facility] [address] [place] [address] [phone] (office)  [phone] (fax)

## 2019-11-17 ENCOUNTER — Other Ambulatory Visit: Payer: Self-pay

## 2019-11-17 ENCOUNTER — Ambulatory Visit (INDEPENDENT_AMBULATORY_CARE_PROVIDER_SITE_OTHER): Payer: Medicare Other | Admitting: Physician Assistant

## 2019-11-17 VITALS — BP 188/58 | HR 78 | Ht 60.0 in | Wt 206.0 lb

## 2019-11-17 DIAGNOSIS — I5032 Chronic diastolic (congestive) heart failure: Secondary | ICD-10-CM

## 2019-11-17 DIAGNOSIS — I480 Paroxysmal atrial fibrillation: Secondary | ICD-10-CM | POA: Diagnosis not present

## 2019-11-17 DIAGNOSIS — R001 Bradycardia, unspecified: Secondary | ICD-10-CM | POA: Diagnosis not present

## 2019-11-17 DIAGNOSIS — I10 Essential (primary) hypertension: Secondary | ICD-10-CM

## 2019-11-17 MED ORDER — METOPROLOL TARTRATE 25 MG PO TABS: 12.5000 mg | ORAL_TABLET | Freq: Two times a day (BID) | ORAL | 1 refills | Status: DC

## 2019-11-17 NOTE — Patient Instructions (Signed)
Medication Instructions:   Your physician recommends that you continue on your current medications as directed. Please refer to the Current Medication list given to you today.  *If you need a refill on your cardiac medications before your next appointment, please call your pharmacy*   Lab Work: [place]   If you have labs (blood work) drawn today and your tests are completely normal, you will receive your results only by: Marland Kitchen MyChart Message (if you have MyChart) OR . A paper copy in the mail If you have any lab test that is abnormal or we need to change your treatment, we will call you to review the results.   Testing/Procedures: NONE ORDERED  TODAY   Follow-Up: At [facility], you and your health needs are our priority.  As part of our continuing mission to provide you with exceptional heart care, we have created designated Provider Care Teams.  These Care Teams include your primary Cardiologist (physician) and Advanced Practice Providers (APPs -  Physician Assistants and Nurse Practitioners) who all work together to provide you with the care you need, when you need it.  We recommend signing up for the patient portal called "MyChart".  Sign up information is provided on this After Visit Summary.  MyChart is used to connect with patients for Virtual Visits (Telemedicine).  Patients are able to view lab/test results, encounter notes, upcoming appointments, etc.  Non-urgent messages can be sent to your provider as well.   To learn more about what you can do with MyChart, go to [email or web address].    Your next appointment:   3 month(s)  The format for your next appointment:   In Person  Provider:   You may see Dr. Rayann Heman  or one of the following Advanced Practice Providers on your designated Care Team:    Chanetta Marshall, NP  Tommye Standard, PA-C  Legrand Como "Oda Kilts, [place]    Other Instructions

## 2019-11-23 DIAGNOSIS — J449 Chronic obstructive pulmonary disease, unspecified: Secondary | ICD-10-CM | POA: Diagnosis not present

## 2019-11-23 DIAGNOSIS — L03119 Cellulitis of unspecified part of limb: Secondary | ICD-10-CM | POA: Diagnosis not present

## 2019-11-27 ENCOUNTER — Ambulatory Visit (HOSPITAL_COMMUNITY)
Admission: RE | Admit: 2019-11-27 | Discharge: 2019-11-27 | Disposition: A | Discharge status: Home / Self Care | Payer: Medicare Other | Source: Ambulatory Visit | Attending: Internal Medicine | Admitting: Internal Medicine

## 2019-11-27 ENCOUNTER — Other Ambulatory Visit: Payer: Self-pay

## 2019-11-27 DIAGNOSIS — D500 Iron deficiency anemia secondary to blood loss (chronic): Secondary | ICD-10-CM | POA: Insufficient documentation

## 2019-11-27 MED ORDER — SODIUM CHLORIDE 0.9 % IV SOLN
INTRAVENOUS | Status: DC | PRN
  Administered 2019-11-27: 250 mL via INTRAVENOUS

## 2019-11-27 MED ORDER — SODIUM CHLORIDE 0.9 % IV SOLN
750.0000 mg | Freq: Once | INTRAVENOUS | Status: AC
  Administered 2019-11-27: 750 mg via INTRAVENOUS
  Filled 2019-11-27: qty 15

## 2019-11-27 NOTE — Progress Notes (Signed)
0930 Pt arrived at the Patient [name]) for a ferric carboxymaltose infusion. Pt A&Ox4 and ambulatory with cane on arrival. Pt sitting in recliner chair, VSS, IV inserted. Awaiting iron from pharmacy. WCTM.   1000 Ferric carboxymaltose infusion started, RN WCTM.   1015 Pt c/o "buring" infusion slowed down.   1033 Infusion done, pt tolerated well, RN monitoring for s/s of allergic reaction, WCTM.   1100 Pt discharged from the [name]. VSS, IV removed without difficulty. RN reviewed discharge information with pt, pt understands without assistance. Pt A&Ox4, ambulatory with cane at time of discharge.

## 2019-11-27 NOTE — Discharge Instructions (Signed)

## 2019-12-02 ENCOUNTER — Other Ambulatory Visit: Payer: Self-pay | Admitting: Internal Medicine

## 2019-12-02 DIAGNOSIS — M1030 Gout due to renal impairment, unspecified site: Secondary | ICD-10-CM

## 2019-12-04 ENCOUNTER — Other Ambulatory Visit: Payer: Self-pay

## 2019-12-04 ENCOUNTER — Ambulatory Visit (HOSPITAL_COMMUNITY)
Admission: RE | Admit: 2019-12-04 | Discharge: 2019-12-04 | Disposition: A | Discharge status: Home / Self Care | Payer: Medicare Other | Source: Ambulatory Visit | Attending: Internal Medicine | Admitting: Internal Medicine

## 2019-12-04 DIAGNOSIS — D500 Iron deficiency anemia secondary to blood loss (chronic): Secondary | ICD-10-CM | POA: Diagnosis not present

## 2019-12-04 MED ORDER — SODIUM CHLORIDE 0.9 % IV SOLN
750.0000 mg | Freq: Once | INTRAVENOUS | Status: AC
  Administered 2019-12-04: 09:00:00 750 mg via INTRAVENOUS
  Filled 2019-12-04: qty 15

## 2019-12-04 MED ORDER — SODIUM CHLORIDE 0.9 % IV SOLN
INTRAVENOUS | Status: DC | PRN
  Administered 2019-12-04: 250 mL via INTRAVENOUS

## 2019-12-04 NOTE — Discharge Instructions (Signed)

## 2019-12-04 NOTE — Progress Notes (Signed)
Patient received  IV Injectafer. Per patient's preference, rate of infusion was reduced to 500 ml/hr. Observed for at least 30 minutes post infusion.Tolerated well, vitals stable, discharge instructions given, verbalized understanding. Patient alert, oriented and ambulatory at the time of discharge.

## 2019-12-23 DIAGNOSIS — J449 Chronic obstructive pulmonary disease, unspecified: Secondary | ICD-10-CM | POA: Diagnosis not present

## 2019-12-23 DIAGNOSIS — L03119 Cellulitis of unspecified part of limb: Secondary | ICD-10-CM | POA: Diagnosis not present

## 2020-01-17 IMAGING — CT CT CHEST W/O CM
2 of 3 series · 15 of 36 positions shown, 18 images · non-contrast
Comparison: Chest CT scan dated 01/01/2018 and chest x-ray dated
01/01/2018 and CT scan abdomen dated 03/19/2016

CLINICAL DATA: Follow-up of pulmonary nodules.

EXAM:
CT CHEST WITHOUT CONTRAST
TECHNIQUE: Multidetector CT imaging of the chest was performed following the
standard protocol without IV contrast.

[Series 2: thorax · axial · 0.73mm/px · z∈[-288,-36]mm · 12 of 150 slices shown, 15 images]
[im 12/150  mediastinal]
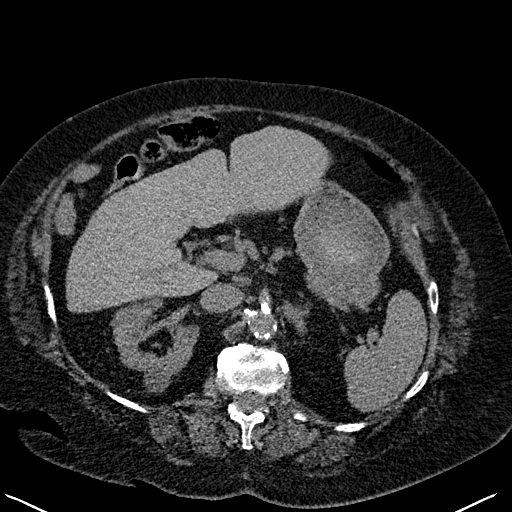
[im 12/150  lung]
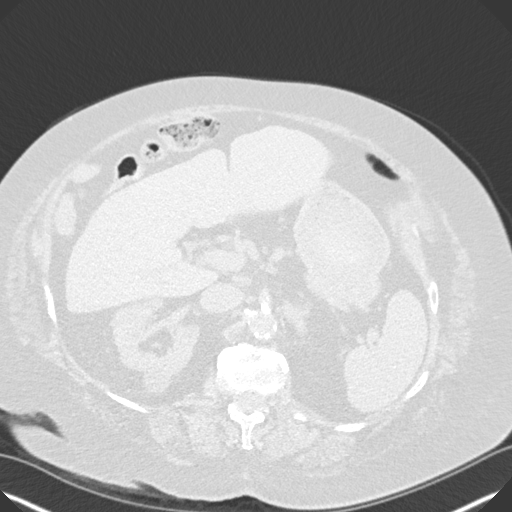
[im 23/150  lung]
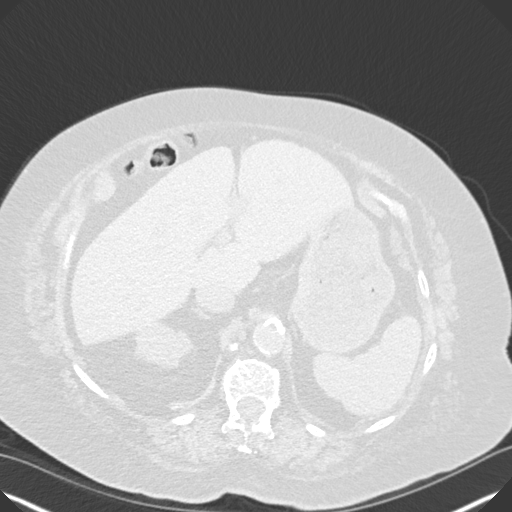
[im 34/150  lung]
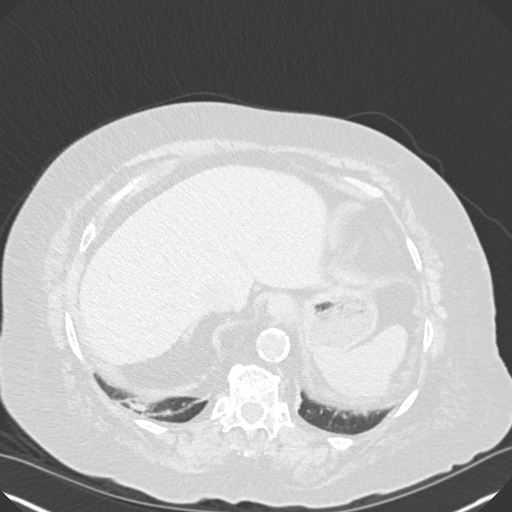
[im 45/150  lung]
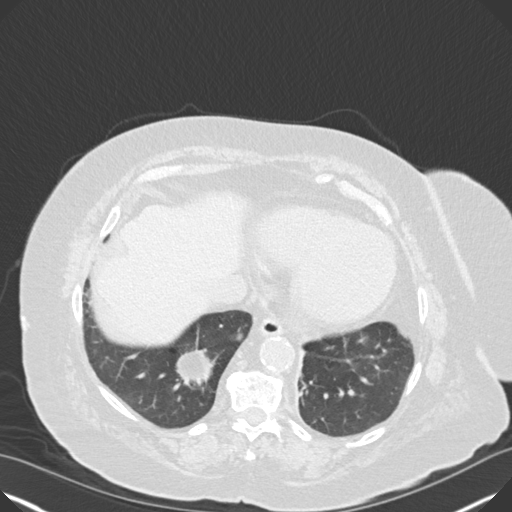
[im 56/150  mediastinal]
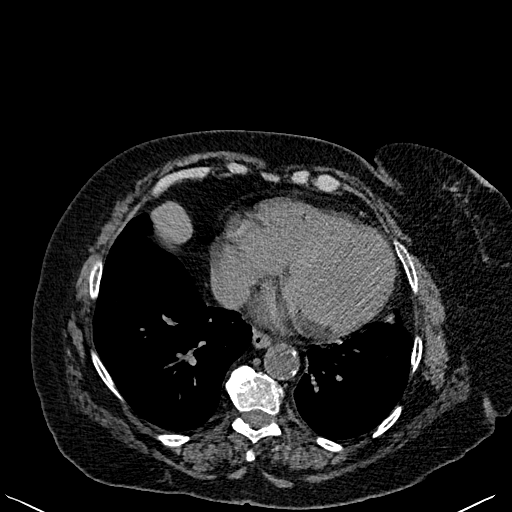
[im 56/150  lung]
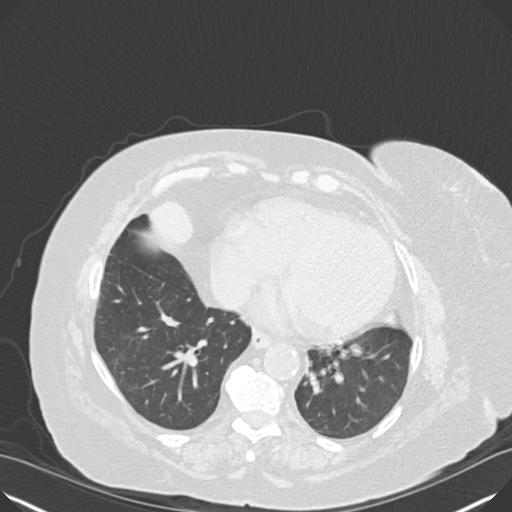
[im 67/150  lung]
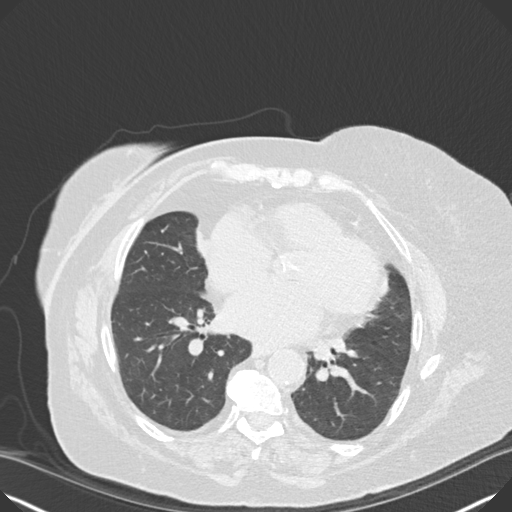
[im 83/150  lung]
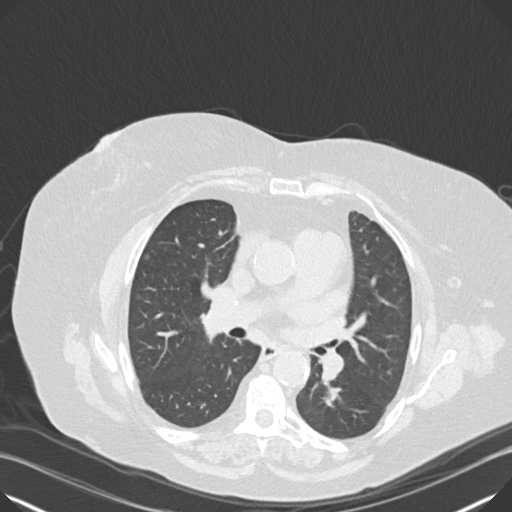
[im 94/150  lung]
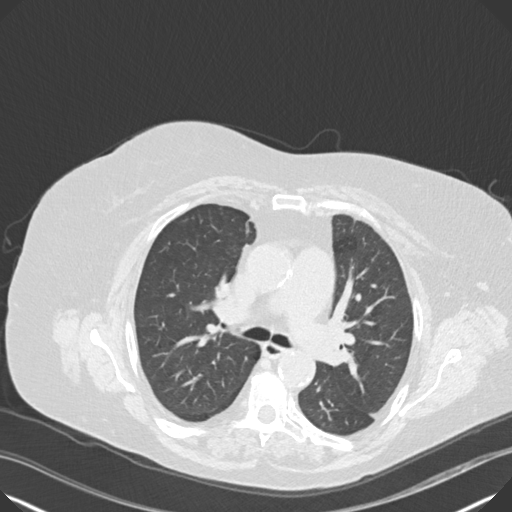
[im 105/150  mediastinal]
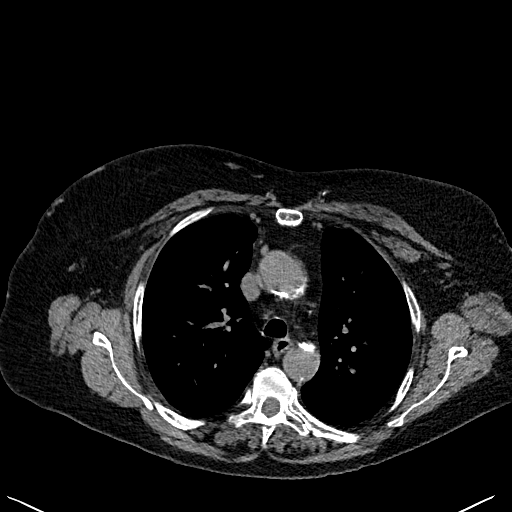
[im 105/150  lung]
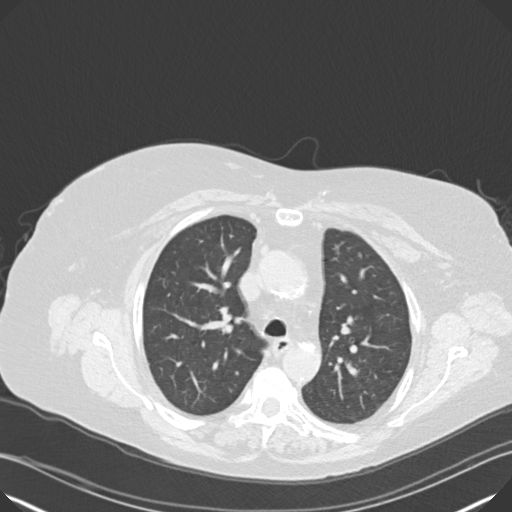
[im 116/150  lung]
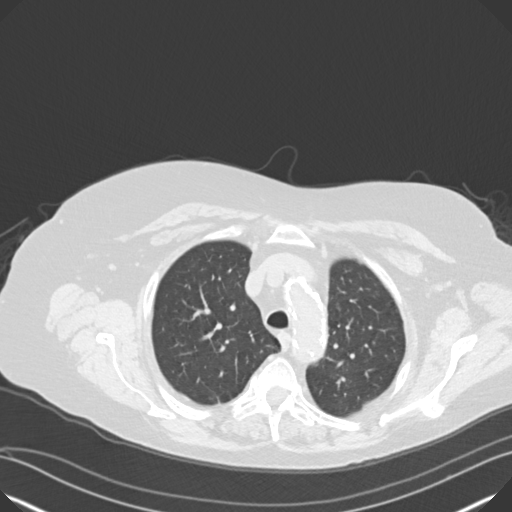
[im 127/150  lung]
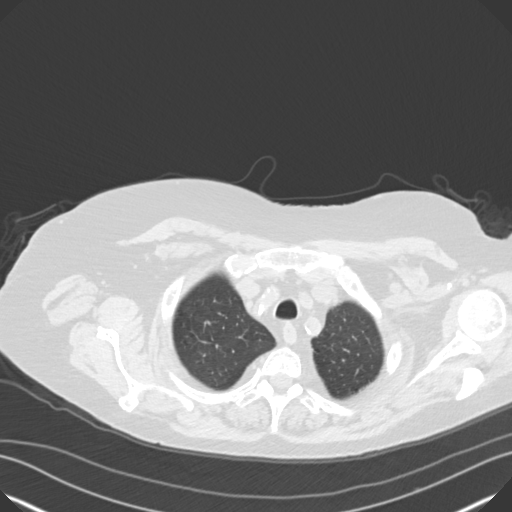
[im 138/150  lung]
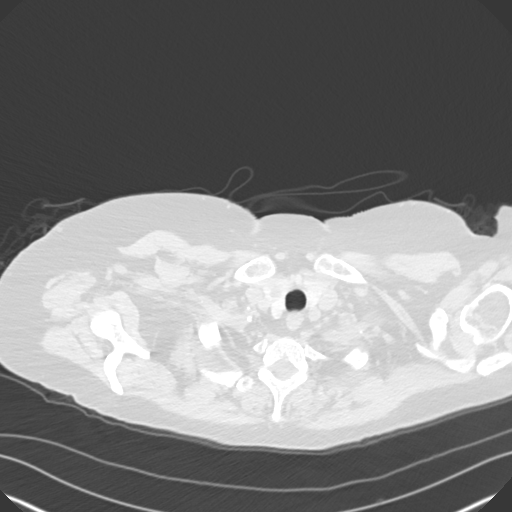

[Series 5: coronal · coronal · 0.59mm/px · 3 of 130 slices shown]
[im 26/130  lung]
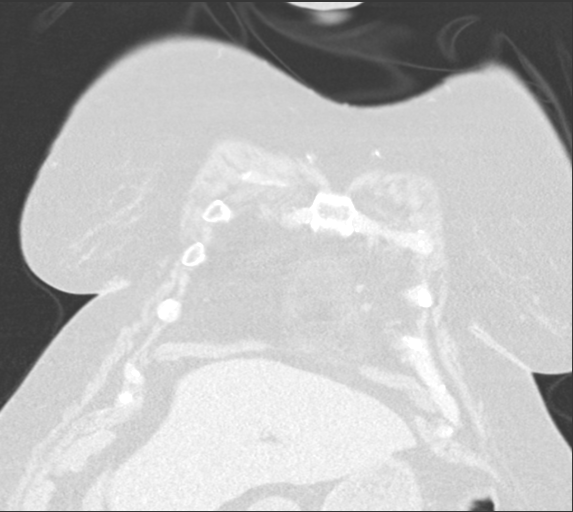
[im 52/130  lung]
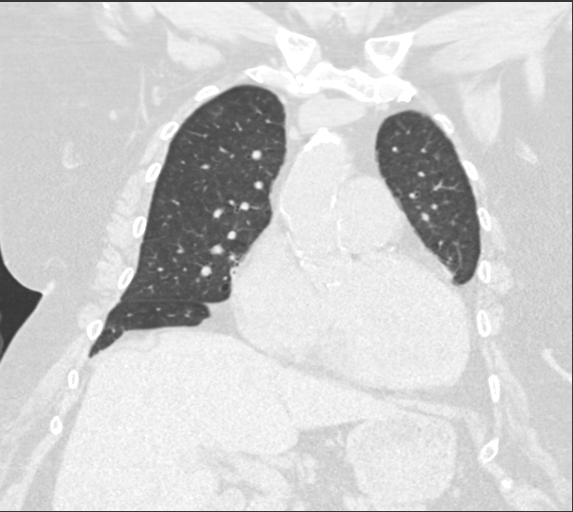
[im 78/130  lung]
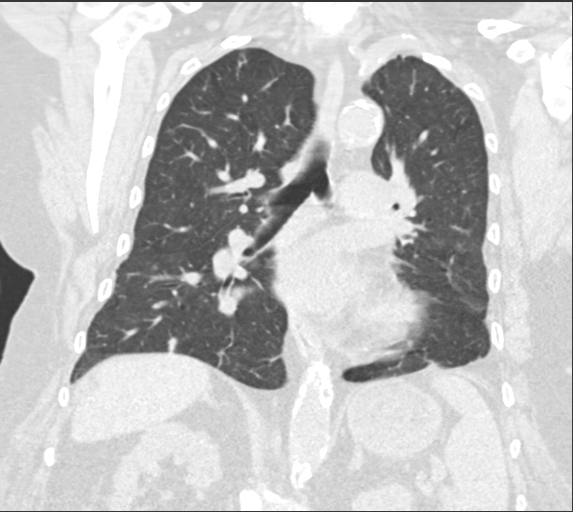

[15 of 36 positions shown; findings below may reference images not displayed]

FINDINGS: Cardiovascular: Extensive aortic atherosclerosis. Coronary artery
calcifications. Heart size is normal. No pericardial effusion.

Mediastinum/Nodes: 12 mm hypo dense lesion in the right lobe of the
thyroid gland, unchanged. No adenopathy. Esophagus and trachea
appear normal.

Lungs/Pleura: The 4 mm ground-glass right upper lobe pulmonary
nodule and a 3 mm subsolid pulmonary nodule in the left upper lobe
noted on the prior exam are unchanged. There are several other 2 mm
nodules in both upper lobes, unchanged. No new pulmonary nodules. No
infiltrates or effusions.

Upper Abdomen: 2.5 cm cyst on the posterior aspect of the upper pole
of the right kidney,, slightly larger than on the prior CT scan.
There is nodularity of the upper pole of both kidneys as
demonstrated on the prior CT scan of the abdomen. There is an 11 mm
nodule in the left adrenal gland, essentially unchanged since the
prior CT scan.

Musculoskeletal: No chest wall mass or suspicious bone lesions
identified.
IMPRESSION: 1. Stable pulmonary nodules bilaterally as described above. Repeat
low-dose CT scan of the chest without contrast in 2 years is
recommended.
2.  Aortic Atherosclerosis ([id number]).

## 2020-01-23 DIAGNOSIS — L03119 Cellulitis of unspecified part of limb: Secondary | ICD-10-CM | POA: Diagnosis not present

## 2020-01-23 DIAGNOSIS — J449 Chronic obstructive pulmonary disease, unspecified: Secondary | ICD-10-CM | POA: Diagnosis not present

## 2020-01-25 ENCOUNTER — Other Ambulatory Visit: Payer: Self-pay | Admitting: Internal Medicine

## 2020-01-25 DIAGNOSIS — I5042 Chronic combined systolic (congestive) and diastolic (congestive) heart failure: Secondary | ICD-10-CM

## 2020-01-25 DIAGNOSIS — I10 Essential (primary) hypertension: Secondary | ICD-10-CM

## 2020-02-04 ENCOUNTER — Other Ambulatory Visit: Payer: Self-pay

## 2020-02-04 ENCOUNTER — Encounter: Payer: Self-pay | Admitting: Internal Medicine

## 2020-02-04 ENCOUNTER — Ambulatory Visit (INDEPENDENT_AMBULATORY_CARE_PROVIDER_SITE_OTHER): Payer: Medicare Other | Admitting: Internal Medicine

## 2020-02-04 VITALS — BP 154/58 | HR 78 | Temp 98.3°F | Ht 60.0 in | Wt 204.2 lb

## 2020-02-04 DIAGNOSIS — D510 Vitamin B12 deficiency anemia due to intrinsic factor deficiency: Secondary | ICD-10-CM

## 2020-02-04 DIAGNOSIS — N1832 Chronic kidney disease, stage 3b: Secondary | ICD-10-CM

## 2020-02-04 DIAGNOSIS — I2781 Cor pulmonale (chronic): Secondary | ICD-10-CM

## 2020-02-04 DIAGNOSIS — E559 Vitamin D deficiency, unspecified: Secondary | ICD-10-CM

## 2020-02-04 DIAGNOSIS — D500 Iron deficiency anemia secondary to blood loss (chronic): Secondary | ICD-10-CM

## 2020-02-04 DIAGNOSIS — J9611 Chronic respiratory failure with hypoxia: Secondary | ICD-10-CM

## 2020-02-04 DIAGNOSIS — E785 Hyperlipidemia, unspecified: Secondary | ICD-10-CM | POA: Diagnosis not present

## 2020-02-04 DIAGNOSIS — Z0000 Encounter for general adult medical examination without abnormal findings: Secondary | ICD-10-CM

## 2020-02-04 DIAGNOSIS — I10 Essential (primary) hypertension: Secondary | ICD-10-CM

## 2020-02-04 DIAGNOSIS — D72825 Bandemia: Secondary | ICD-10-CM

## 2020-02-04 DIAGNOSIS — R10814 Left lower quadrant abdominal tenderness: Secondary | ICD-10-CM

## 2020-02-04 LAB — CBC WITH DIFFERENTIAL/PLATELET
Absolute Monocytes: 957 cells/uL — ABNORMAL HIGH (ref 200–950)
Basophils Absolute: 55 cells/uL (ref 0–200)
Basophils Relative: 0.5 %
Eosinophils Absolute: 286 cells/uL (ref 15–500)
Eosinophils Relative: 2.6 %
HCT: 28.5 % — ABNORMAL LOW (ref 35.0–45.0)
Hemoglobin: 9.1 g/dL — ABNORMAL LOW (ref 11.7–15.5)
Lymphs Abs: 1364 cells/uL (ref 850–3900)
MCH: 27 pg (ref 27.0–33.0)
MCHC: 31.9 g/dL — ABNORMAL LOW (ref 32.0–36.0)
MCV: 84.6 fL (ref 80.0–100.0)
MPV: 10.8 fL (ref 7.5–12.5)
Monocytes Relative: 8.7 %
Neutro Abs: 8338 cells/uL — ABNORMAL HIGH (ref 1500–7800)
Neutrophils Relative %: 75.8 %
Platelets: 223 10*3/uL (ref 140–400)
RBC: 3.37 10*6/uL — ABNORMAL LOW (ref 3.80–5.10)
RDW: 13.5 % (ref 11.0–15.0)
Total Lymphocyte: 12.4 %
WBC: 11 10*3/uL — ABNORMAL HIGH (ref 3.8–10.8)

## 2020-02-04 LAB — LIPID PANEL
Cholesterol: 165 mg/dL (ref <200)
HDL: 49 mg/dL — ABNORMAL LOW (ref >=50)
LDL Cholesterol (Calc): 90 mg/dL (calc)
Non-HDL Cholesterol (Calc): 116 mg/dL (calc) (ref <130)
Total CHOL/HDL Ratio: 3.4 (calc) (ref <5.0)
Triglycerides: 162 mg/dL — ABNORMAL HIGH (ref <150)

## 2020-02-04 LAB — BASIC METABOLIC PANEL
BUN/Creatinine Ratio: 27 (calc) — ABNORMAL HIGH (ref 6–22)
BUN: 37 mg/dL — ABNORMAL HIGH (ref 7–25)
CO2: 30 mmol/L (ref 20–32)
Calcium: 9.8 mg/dL (ref 8.6–10.4)
Chloride: 101 mmol/L (ref 98–110)
Creat: 1.36 mg/dL — ABNORMAL HIGH (ref 0.60–0.93)
Glucose, Bld: 99 mg/dL (ref 65–99)
Potassium: 4 mmol/L (ref 3.5–5.3)
Sodium: 139 mmol/L (ref 135–146)

## 2020-02-04 LAB — TIQ-MISC

## 2020-02-04 LAB — FOLATE: Folate: 7.1 ng/mL

## 2020-02-04 LAB — FERRITIN: Ferritin: 95 ng/mL (ref 16–288)

## 2020-02-04 NOTE — Patient Instructions (Signed)
Health Maintenance, Female Adopting a healthy lifestyle and getting preventive care are important in promoting health and wellness. Ask your health care provider about:  The right schedule for you to have regular tests and exams.  Things you can do on your own to prevent diseases and keep yourself healthy. What should I know about diet, weight, and exercise? Eat a healthy diet   Eat a diet that includes plenty of vegetables, fruits, low-fat dairy products, and lean protein.  Do not eat a lot of foods that are high in solid fats, added sugars, or sodium. Maintain a healthy weight Body mass index (BMI) is used to identify weight problems. It estimates body fat based on height and weight. Your health care provider can help determine your BMI and help you achieve or maintain a healthy weight. Get regular exercise Get regular exercise. This is one of the most important things you can do for your health. Most adults should:  Exercise for at least 150 minutes each week. The exercise should increase your heart rate and make you sweat (moderate-intensity exercise).  Do strengthening exercises at least twice a week. This is in addition to the moderate-intensity exercise.  Spend less time sitting. Even light physical activity can be beneficial. Watch cholesterol and blood lipids Have your blood tested for lipids and cholesterol at 80 years of age, then have this test every 5 years. Have your cholesterol levels checked more often if:  Your lipid or cholesterol levels are high.  You are older than 80 years of age.  You are at high risk for heart disease. What should I know about cancer screening? Depending on your health history and family history, you may need to have cancer screening at various ages. This may include screening for:  Breast cancer.  Cervical cancer.  Colorectal cancer.  Skin cancer.  Lung cancer. What should I know about heart disease, diabetes, and high blood  pressure? Blood pressure and heart disease  High blood pressure causes heart disease and increases the risk of stroke. This is more likely to develop in people who have high blood pressure readings, are of African descent, or are overweight.  Have your blood pressure checked: ? Every 3-5 years if you are 18-39 years of age. ? Every year if you are 40 years old or older. Diabetes Have regular diabetes screenings. This checks your fasting blood sugar level. Have the screening done:  Once every three years after age 40 if you are at a normal weight and have a low risk for diabetes.  More often and at a younger age if you are overweight or have a high risk for diabetes. What should I know about preventing infection? Hepatitis B If you have a higher risk for hepatitis B, you should be screened for this virus. Talk with your health care provider to find out if you are at risk for hepatitis B infection. Hepatitis C Testing is recommended for:  Everyone born from 1945 through 1965.  Anyone with known risk factors for hepatitis C. Sexually transmitted infections (STIs)  Get screened for STIs, including gonorrhea and chlamydia, if: ? You are sexually active and are younger than 80 years of age. ? You are older than 80 years of age and your health care provider tells you that you are at risk for this type of infection. ? Your sexual activity has changed since you were last screened, and you are at increased risk for chlamydia or gonorrhea. Ask your health care provider if   you are at risk.  Ask your health care provider about whether you are at high risk for HIV. Your health care provider may recommend a prescription medicine to help prevent HIV infection. If you choose to take medicine to prevent HIV, you should first get tested for HIV. You should then be tested every 3 months for as long as you are taking the medicine. Pregnancy  If you are about to stop having your period (premenopausal) and  you may become pregnant, seek counseling before you get pregnant.  Take 400 to 800 micrograms (mcg) of folic acid every day if you become pregnant.  Ask for birth control (contraception) if you want to prevent pregnancy. Osteoporosis and menopause Osteoporosis is a disease in which the bones lose minerals and strength with aging. This can result in bone fractures. If you are 65 years old or older, or if you are at risk for osteoporosis and fractures, ask your health care provider if you should:  Be screened for bone loss.  Take a calcium or vitamin D supplement to lower your risk of fractures.  Be given hormone replacement therapy (HRT) to treat symptoms of menopause. Follow these instructions at home: Lifestyle  Do not use any products that contain nicotine or tobacco, such as cigarettes, e-cigarettes, and chewing tobacco. If you need help quitting, ask your health care provider.  Do not use street drugs.  Do not share needles.  Ask your health care provider for help if you need support or information about quitting drugs. Alcohol use  Do not drink alcohol if: ? Your health care provider tells you not to drink. ? You are pregnant, may be pregnant, or are planning to become pregnant.  If you drink alcohol: ? Limit how much you use to 0-1 drink a day. ? Limit intake if you are breastfeeding.  Be aware of how much alcohol is in your drink. In the [place] one drink equals one 12 oz bottle of beer (355 mL), one 5 oz glass of wine (148 mL), or one 1 oz glass of hard liquor (44 mL). General instructions  Schedule regular health, dental, and eye exams.  Stay current with your vaccines.  Tell your health care provider if: ? You often feel depressed. ? You have ever been abused or do not feel safe at home. Summary  Adopting a healthy lifestyle and getting preventive care are important in promoting health and wellness.  Follow your health care provider's instructions about healthy  diet, exercising, and getting tested or screened for diseases.  Follow your health care provider's instructions on monitoring your cholesterol and blood pressure. This information is not intended to replace advice given to you by your health care provider. Make sure you discuss any questions you have with your health care provider. Document Revised: 07/09/2018 Document Reviewed: 07/09/2018 Elsevier Patient Education  2020 [employer]  

## 2020-02-04 NOTE — Progress Notes (Signed)
Subjective:  Patient ID: [name], female    DOB: 05/03/1941  Age: 79 y.o. MRN: [id number]  CC: Anemia and Annual Exam  This visit occurred during the SARS-CoV-2 public health emergency.  Safety protocols were in place, including screening questions prior to the visit, additional usage of staff PPE, and extensive cleaning of exam room while observing appropriate contact time as indicated for disinfecting solutions.    HPI [name] presents for a CPX.  She was in her usual state of health until about a week ago when she noticed a few episodes of bright red blood per rectum.  She has not passed any more blood for the last 4 days.  She has a ventral abdominal hernia and is not a surgical candidate.  She complains that over the last few days the abdominal hernia over the lower abdomen has become more painful, feels hard, and is becoming large.  She complains of lower extremity edema, weakness, and fatigue.  She denies chest pain, palpitations, nausea, vomiting, diarrhea, constipation, fever, chills, dysuria, or hematuria.  Outpatient Medications Prior to Visit  Medication Sig Dispense Refill  . acetaminophen (TYLENOL) 325 MG tablet Take 325-650 mg by mouth daily as needed for moderate pain, fever or headache.     . albuterol (PROAIR HFA) 108 (90 Base) MCG/ACT inhaler Inhale 1-2 puffs into the lungs every 6 (six) hours as needed for wheezing or shortness of breath. 18 g 11  . allopurinol (ZYLOPRIM) 100 MG tablet TAKE 1 TABLET BY MOUTH EVERY DAY FOR GOUT 90 tablet 1  . BIDIL 20-37.5 MG tablet TAKE 1 TABLET BY MOUTH THREE TIMES DAILY 270 tablet 0  . BREO ELLIPTA 100-25 MCG/INH AEPB INHALE 1 PUFF INTO THE LUNGS DAILY 90 each 1  . Dietary Management Product (VASCULERA) TABS TAKE ONE TABLET BY MOUTH EVERY DAY 90 tablet 1  . ferrous sulfate 325 (65 FE) MG tablet Take 1 tablet (325 mg total) by mouth 2 (two) times daily with a meal. 180 tablet 1  . metoprolol tartrate (LOPRESSOR) 25 MG  tablet Take 0.5 tablets (12.5 mg total) by mouth 2 (two) times daily. 90 tablet 1  . torsemide (DEMADEX) 20 MG tablet TAKE 1 TABLET(20 MG) BY MOUTH TWICE DAILY 180 tablet 0   No facility-administered medications prior to visit.    ROS Review of Systems  Constitutional: Negative for appetite change, chills, diaphoresis, fatigue and fever.  HENT: Negative.  Negative for trouble swallowing.   Eyes: Negative.   Respiratory: Positive for shortness of breath. Negative for cough, chest tightness and wheezing.   Cardiovascular: Positive for leg swelling. Negative for chest pain and palpitations.  Gastrointestinal: Positive for abdominal distention, abdominal pain, anal bleeding and blood in stool. Negative for constipation, diarrhea, nausea and vomiting.  Endocrine: Negative.   Genitourinary: Negative.  Negative for difficulty urinating.  Musculoskeletal: Negative.   Skin: Negative for color change and pallor.  Neurological: Negative.  Negative for dizziness, weakness, light-headedness, numbness and headaches.  Hematological: Negative for adenopathy. Does not bruise/bleed easily.  Psychiatric/Behavioral: Negative.     Objective:  BP (!) 154/58 (BP Location: Left Arm, Patient Position: Sitting, Cuff Size: Large)   Pulse 78   Temp 98.3 F (36.8 C) (Oral)   Ht 5' (1.524 m)   Wt 204 lb 4 oz (92.6 kg)   SpO2 95%   BMI 39.89 kg/m   BP Readings from Last 3 Encounters:  02/04/20 (!) 154/58  12/04/19 (!) 155/50  11/27/19 (!) 154/60  Wt Readings from Last 3 Encounters:  02/04/20 204 lb 4 oz (92.6 kg)  11/17/19 206 lb (93.4 kg)  11/12/19 206 lb 6 oz (93.6 kg)    Physical Exam Vitals reviewed.  Constitutional:      Appearance: Normal appearance. She is not ill-appearing.  HENT:     Nose: Nose normal.     Mouth/Throat:     Lips: Pink.     Mouth: Mucous membranes are moist. Mucous membranes are pale.  Eyes:     General: No scleral icterus.    Conjunctiva/sclera: Conjunctivae  normal.  Cardiovascular:     Rate and Rhythm: Normal rate and regular rhythm.     Heart sounds: Murmur heard.  Systolic murmur is present with a grade of 1/6.  No gallop.   Abdominal:     General: Abdomen is protuberant. Bowel sounds are normal. There is distension.     Palpations: There is no hepatomegaly, splenomegaly or mass.     Tenderness: There is abdominal tenderness in the suprapubic area and left lower quadrant.     Hernia: A hernia is present. Hernia is present in the ventral area. There is no hernia in the left inguinal area, right femoral area, left femoral area or right inguinal area.  Musculoskeletal:        General: Normal range of motion.     Right lower leg: 1+ Pitting Edema present.     Left lower leg: 1+ Pitting Edema present.  Skin:    General: Skin is warm and dry.     Coloration: Skin is pale.  Neurological:     General: No focal deficit present.     Mental Status: She is alert.  Psychiatric:        Mood and Affect: Mood normal.        Behavior: Behavior normal.     Lab Results  Component Value Date   WBC 11.0 (H) 02/04/2020   HGB 9.1 (L) 02/04/2020   HCT 28.5 (L) 02/04/2020   PLT 223 02/04/2020   GLUCOSE 99 02/04/2020   CHOL 165 02/04/2020   TRIG 162 (H) 02/04/2020   HDL 49 (L) 02/04/2020   LDLDIRECT 107.0 09/04/2016   LDLCALC 90 02/04/2020   ALT 10 08/04/2018   AST 16 08/04/2018   NA 139 02/04/2020   K 4.0 02/04/2020   CL 101 02/04/2020   CREATININE 1.36 (H) 02/04/2020   BUN 37 (H) 02/04/2020   CO2 30 02/04/2020   TSH 2.35 07/02/2019   INR 1.07 08/08/2017   HGBA1C 5.4 11/05/2017   MICROALBUR <0.7 11/05/2017    No results found.  Assessment & Plan:   [name] was seen today for anemia and annual exam.  Diagnoses and all orders for this visit:  Essential hypertension- Her blood pressure is adequately well controlled. -     Basic metabolic panel; Future -     Basic metabolic panel  Stage 3b chronic kidney disease- Her renal function  is stable. -     Basic metabolic panel; Future -     Basic metabolic panel  Iron deficiency anemia due to chronic blood loss- Her H&H have gone down only slightly.  At this time I do not recommend a transfusion of red blood cells or iron.  She has had no bleeding for 4 days now. -     CBC with Differential/Platelet; Future -     Cancel: IBC panel; Future -     Ferritin; Future -     Ferritin -  Cancel: IBC panel -     CBC with Differential/Platelet -     Iron Binding Cap (TIBC)([facility]); Future -     Iron Binding Cap (TIBC)([facility])  Hyperlipidemia with target LDL less than 100- I have asked her to try pitavastatin for cardiovascular risk reduction. -     Lipid panel; Future -     Lipid panel -     Pitavastatin Calcium 1 MG TABS; Take 1 tablet (1 mg total) by mouth daily.  Vitamin B12 deficiency anemia due to intrinsic factor deficiency- Will continue B12 replacement therapy. -     CBC with Differential/Platelet; Future -     Folate; Future -     Folate -     CBC with Differential/Platelet  Vitamin D deficiency  Left lower quadrant abdominal tenderness without rebound tenderness- She has worsening abdominal symptoms and renal insufficiency.  I have asked her to undergo a CT of the abdomen without contrast to see if there is evidence of ischemia, infection, abscess, or strangulation of the hernia. -     Cancel: CT Chest Wo Contrast; Future -     CT Abdomen Pelvis Wo Contrast; Future  Bandemia- See above. -     Cancel: CT Chest Wo Contrast; Future -     CT Abdomen Pelvis Wo Contrast; Future  Cor pulmonale ([place])- She is stable on continuous oxygen.  Chronic respiratory failure with hypoxia ([place])- She is stable on continuous oxygen.  Other orders -     TIQ-MISC   I am having [name] start on Pitavastatin Calcium. I am also having her maintain her albuterol, acetaminophen, Vasculera, Breo Ellipta, BiDil, ferrous sulfate, metoprolol tartrate,  allopurinol, and torsemide.  Meds ordered this encounter  Medications  . Pitavastatin Calcium 1 MG TABS    Sig: Take 1 tablet (1 mg total) by mouth daily.    Dispense:  90 tablet    Refill:  1   In addition to time spent on CPE, I spent 60 minutes in preparing to see the patient by review of recent labs, imaging and procedures, obtaining and reviewing separately obtained history, communicating with the patient and family or caregiver, ordering medications, tests or procedures, and documenting clinical information in the EHR including the differential Dx, treatment, and any further evaluation and other management of 1. Essential hypertension 2. Stage 3b chronic kidney disease 3. Iron deficiency anemia due to chronic blood loss 4. Hyperlipidemia with target LDL less than 100 5. Vitamin B12 deficiency anemia due to intrinsic factor deficiency 6. Vitamin D deficiency 7. Left lower quadrant abdominal tenderness without rebound tenderness 8. Bandemia 9. Cor pulmonale (HCC) 10. Chronic respiratory failure with hypoxia ([place])     Follow-up: Return in about 3 months (around 05/06/2020).  [name], MD

## 2020-02-05 ENCOUNTER — Telehealth: Payer: Self-pay | Admitting: Internal Medicine

## 2020-02-05 MED ORDER — PITAVASTATIN CALCIUM 1 MG PO TABS: 1.0000 | ORAL_TABLET | Freq: Every day | ORAL | 1 refills | Status: DC

## 2020-02-05 NOTE — Telephone Encounter (Signed)
Patient is returning your call.  

## 2020-02-05 NOTE — Telephone Encounter (Signed)
Closing note. Documented in lab notes.

## 2020-02-06 DIAGNOSIS — Z0000 Encounter for general adult medical examination without abnormal findings: Secondary | ICD-10-CM | POA: Insufficient documentation

## 2020-02-06 DIAGNOSIS — J9621 Acute and chronic respiratory failure with hypoxia: Secondary | ICD-10-CM | POA: Insufficient documentation

## 2020-02-06 DIAGNOSIS — R10814 Left lower quadrant abdominal tenderness: Secondary | ICD-10-CM | POA: Insufficient documentation

## 2020-02-06 DIAGNOSIS — J9611 Chronic respiratory failure with hypoxia: Secondary | ICD-10-CM | POA: Insufficient documentation

## 2020-02-06 DIAGNOSIS — D72825 Bandemia: Secondary | ICD-10-CM | POA: Insufficient documentation

## 2020-02-06 NOTE — Assessment & Plan Note (Signed)
Exam completed. Labs reviewed. Vaccines reviewed and updated. No cancer screenings are indicated. Patient education was given.

## 2020-02-08 ENCOUNTER — Ambulatory Visit
Admission: RE | Admit: 2020-02-08 | Discharge: 2020-02-08 | Disposition: A | Discharge status: Home / Self Care | Payer: Medicare Other | Source: Ambulatory Visit | Attending: Internal Medicine | Admitting: Internal Medicine

## 2020-02-08 ENCOUNTER — Other Ambulatory Visit: Payer: Self-pay

## 2020-02-08 DIAGNOSIS — M47816 Spondylosis without myelopathy or radiculopathy, lumbar region: Secondary | ICD-10-CM | POA: Diagnosis not present

## 2020-02-08 DIAGNOSIS — R10814 Left lower quadrant abdominal tenderness: Secondary | ICD-10-CM

## 2020-02-08 DIAGNOSIS — I700 Atherosclerosis of aorta: Secondary | ICD-10-CM | POA: Diagnosis not present

## 2020-02-08 DIAGNOSIS — K449 Diaphragmatic hernia without obstruction or gangrene: Secondary | ICD-10-CM | POA: Diagnosis not present

## 2020-02-08 DIAGNOSIS — D72825 Bandemia: Secondary | ICD-10-CM

## 2020-02-08 DIAGNOSIS — K429 Umbilical hernia without obstruction or gangrene: Secondary | ICD-10-CM | POA: Diagnosis not present

## 2020-02-15 ENCOUNTER — Other Ambulatory Visit: Payer: Self-pay

## 2020-02-15 ENCOUNTER — Ambulatory Visit: Payer: Medicare Other | Admitting: Internal Medicine

## 2020-02-15 ENCOUNTER — Encounter: Payer: Self-pay | Admitting: Internal Medicine

## 2020-02-15 VITALS — BP 120/82 | HR 60 | Ht 60.0 in | Wt 206.8 lb

## 2020-02-15 DIAGNOSIS — I480 Paroxysmal atrial fibrillation: Secondary | ICD-10-CM

## 2020-02-15 DIAGNOSIS — I10 Essential (primary) hypertension: Secondary | ICD-10-CM | POA: Diagnosis not present

## 2020-02-15 DIAGNOSIS — R001 Bradycardia, unspecified: Secondary | ICD-10-CM | POA: Diagnosis not present

## 2020-02-15 DIAGNOSIS — G4733 Obstructive sleep apnea (adult) (pediatric): Secondary | ICD-10-CM | POA: Diagnosis not present

## 2020-02-15 NOTE — Progress Notes (Signed)
PCP: [name], MD Primary Cardiologist: Dr Irish Lack Primary EP: Dr Rayann Heman  [name] is a 79 y.o. female who presents today for routine electrophysiology followup.  Since last being seen in our clinic, the patient reports doing reasonably well.  She has had issues with prior GI bleeding and has required iron infusions for iron deficiency anemia.  She is chronically SOB and not very active.  Occasional dizziness.  + dependant edema.  Does not use her CPAP.  Today, she denies symptoms of palpitations, chest pain, or syncope.  The patient is otherwise without complaint today.   Past Medical History:  Diagnosis Date  . Allergy   . Angiodysplasia of cecum   . Angiodysplasia of duodenum   . Arthritis   . Asteatotic eczema 02/25/2016  . Asthma   . Atrial fibrillation ([place]) 10/17/2017  . Blood transfusion without reported diagnosis   . Cataract   . Chest pain at rest 03/22/2010   Qualifier: Diagnosis of  By: Ronnald Ramp MD, Arvid Right.   . CHF (congestive heart failure) ([place]) 08/31/03  . CHF (congestive heart failure), NYHA class III, chronic, combined ([place]) 08/10/2017  . Chronic venous stasis dermatitis of both lower extremities 06/02/2013  . CKD (chronic kidney disease), stage III 06/20/2010   Estimated Creatinine Clearance: 34.8 mL/min (A) (by C-G formula based on SCr of 1.37 mg/dL (H)).   Marland Kitchen COLD (chronic obstructive lung disease) ([place])   . Colonic polyp 02/16/2008   Tubular adenoma  . COPD (chronic obstructive pulmonary disease) ([place]) 01/27/98  . Cor pulmonale ([place]) 11/16/2014  . D-dimer, elevated 01/01/2018  . GERD 07/30/1992  . GERD (gastroesophageal reflux disease) 07/30/92  . Gout   . Heart murmur   . HTN (hypertension) 07/30/1988  . Hyperlipidemia 11/27/96  . Hyperlipidemia with target LDL less than 100 11/27/1996       . Kidney stone 1960, 1972, 1991  . MGUS (monoclonal gammopathy of unknown significance) 11/08/2016  . Morbid obesity ([place]) 04/19/2010   She agrees to work on her lifestyle  modifications to help her lose weight.   . Neck pain on right side 01/01/2018  . OSA (obstructive sleep apnea) 06/22/2013  . Osteopenia, senile 02/05/2013   July 2014  -2.1 left femur -1.9 left forearm   . Other screening mammogram 02/05/2013  . Oxygen deficiency   . Prediabetes 11/13/2006  . Renal insufficiency   . Stenosis of cervical spine with myelopathy ([place]) 01/01/2018  . Ulcer of leg, chronic, right ([place]) 10/10/2011  . Ulcerative colitis   . ULCERATIVE COLITIS-LEFT SIDE 03/19/2008       . Vitamin B12 deficiency anemia 11/13/2006       . Vitamin D deficiency 02/06/2013   Past Surgical History:  Procedure Laterality Date  . BRONCHOSCOPY    . COLONOSCOPY    . COLONOSCOPY WITH PROPOFOL N/A 02/23/2015   Procedure: COLONOSCOPY WITH PROPOFOL;  Surgeon: Ladene Artist, MD;  Location: [facility];  Service: Endoscopy;  Laterality: N/A;  . COLONOSCOPY WITH PROPOFOL N/A 08/12/2018   Procedure: COLONOSCOPY WITH PROPOFOL;  Surgeon: Ladene Artist, MD;  Location: [facility];  Service: Endoscopy;  Laterality: N/A;  . ESOPHAGOGASTRODUODENOSCOPY (EGD) WITH PROPOFOL N/A 02/23/2015   Procedure: ESOPHAGOGASTRODUODENOSCOPY (EGD) WITH PROPOFOL;  Surgeon: Ladene Artist, MD;  Location: [facility];  Service: Endoscopy;  Laterality: N/A;  . ESOPHAGOGASTRODUODENOSCOPY (EGD) WITH PROPOFOL N/A 08/12/2018   Procedure: ESOPHAGOGASTRODUODENOSCOPY (EGD) WITH PROPOFOL;  Surgeon: Ladene Artist, MD;  Location: [facility];  Service: Endoscopy;  Laterality: N/A;  . HOT HEMOSTASIS N/A 08/12/2018   Procedure: HOT HEMOSTASIS (ARGON PLASMA COAGULATION/BICAP);  Surgeon: Ladene Artist, MD;  Location: [facility];  Service: Endoscopy;  Laterality: N/A;  EGD and Colon APC  . NO PAST SURGERIES    . POLYPECTOMY      ROS- all systems are reviewed and negatives except as per HPI above  Current Outpatient Medications  Medication Sig Dispense Refill  . acetaminophen (TYLENOL) 325 MG tablet Take 325-650 mg by mouth  daily as needed for moderate pain, fever or headache.     . albuterol (PROAIR HFA) 108 (90 Base) MCG/ACT inhaler Inhale 1-2 puffs into the lungs every 6 (six) hours as needed for wheezing or shortness of breath. 18 g 11  . allopurinol (ZYLOPRIM) 100 MG tablet TAKE 1 TABLET BY MOUTH EVERY DAY FOR GOUT 90 tablet 1  . BIDIL 20-37.5 MG tablet TAKE 1 TABLET BY MOUTH THREE TIMES DAILY 270 tablet 0  . BREO ELLIPTA 100-25 MCG/INH AEPB INHALE 1 PUFF INTO THE LUNGS DAILY 90 each 1  . ferrous sulfate 325 (65 FE) MG tablet Take 1 tablet (325 mg total) by mouth 2 (two) times daily with a meal. 180 tablet 1  . metoprolol tartrate (LOPRESSOR) 25 MG tablet Take 0.5 tablets (12.5 mg total) by mouth 2 (two) times daily. 90 tablet 1  . Pitavastatin Calcium 1 MG TABS Take 1 tablet (1 mg total) by mouth daily. 90 tablet 1  . torsemide (DEMADEX) 20 MG tablet TAKE 1 TABLET(20 MG) BY MOUTH TWICE DAILY 180 tablet 0   No current facility-administered medications for this visit.    Physical Exam: Vitals:   02/15/20 1103  BP: 120/82  Pulse: 60  SpO2: 95%  Weight: 206 lb 12.8 oz (93.8 kg)  Height: 5' (1.524 m)    GEN- The patient is overweight, elderly and chronically ill appearing, alert and oriented x 3 today.   Head- normocephalic, atraumatic Eyes-  Sclera clear, conjunctiva pink Ears- hearing intact Oropharynx- clear Lungs-  normal work of breathing Heart- Regular rate and rhythm  GI- soft  Extremities- no clubbing, cyanosis, + dependant edema  Wt Readings from Last 3 Encounters:  02/15/20 206 lb 12.8 oz (93.8 kg)  02/04/20 204 lb 4 oz (92.6 kg)  11/17/19 206 lb (93.4 kg)    EKG tracing ordered today is personally reviewed and shows sinus rhythm 60 bpm, PR 200 msec,   Assessment and Plan:  1. Sinus bradycardia Heart rates are mostly 50s, occasionally lower. Given advanced age, chronic lung disease, iron deficiency anemia, and obesity, I am not convinced that her symptoms are related to heart  rate. confederative management is advised Stop metoprolol  2. HTN Stable No change required today  3. Morbid obesity Lifestyle modification advised  4. Paroxysmal atrial fibrillation Has had prior GI bleeding and therefore is not on [place] therapy  5. OSA She is not compliant with treatment.  This probably contributes to her overall health state in an adverse way.  6. Chronic diastolic dysfunction Sodium restriction and weight loss advised  Risks, benefits and potential toxicities for medications prescribed and/or refilled reviewed with patient today.   No indication for pacing at this time Follow-up with EP APP every 6 months I will see when needed  Thompson Grayer MD, [facility] 02/15/2020 11:12 AM

## 2020-02-15 NOTE — Patient Instructions (Addendum)
Medication Instructions:  Your physician has recommended you make the following change in your medication:  1 Stop your Metoprolol Tartrate  *If you need a refill on your cardiac medications before your next appointment, please call your pharmacy*  Lab Work: None ordered.  If you have labs (blood work) drawn today and your tests are completely normal, you will receive your results only by: Marland Kitchen MyChart Message (if you have MyChart) OR . A paper copy in the mail If you have any lab test that is abnormal or we need to change your treatment, we will call you to review the results.  Testing/Procedures: None ordered.  Follow-Up: At [facility], you and your health needs are our priority.  As part of our continuing mission to provide you with exceptional heart care, we have created designated Provider Care Teams.  These Care Teams include your primary Cardiologist (physician) and Advanced Practice Providers (APPs -  Physician Assistants and Nurse Practitioners) who all work together to provide you with the care you need, when you need it.  We recommend signing up for the patient portal called "MyChart".  Sign up information is provided on this After Visit Summary.  MyChart is used to connect with patients for Virtual Visits (Telemedicine).  Patients are able to view lab/test results, encounter notes, upcoming appointments, etc.  Non-urgent messages can be sent to your provider as well.   To learn more about what you can do with MyChart, go to [email or web address].    Your next appointment:   Your physician wants you to follow-up in: 6 months with Renee. You will receive a reminder letter in the mail two months in advance. If you don't receive a letter, please call our office to schedule the follow-up appointment.   Other Instructions:

## 2020-02-19 ENCOUNTER — Emergency Department (HOSPITAL_COMMUNITY)
Admission: EM | Admit: 2020-02-19 | Discharge: 2020-02-19 | Discharge status: Against Medical Advice | Payer: Medicare Other | Attending: Emergency Medicine | Admitting: Emergency Medicine

## 2020-02-19 ENCOUNTER — Encounter (HOSPITAL_COMMUNITY): Payer: Self-pay | Admitting: Emergency Medicine

## 2020-02-19 ENCOUNTER — Other Ambulatory Visit: Payer: Self-pay

## 2020-02-19 ENCOUNTER — Telehealth: Payer: Self-pay | Admitting: Internal Medicine

## 2020-02-19 DIAGNOSIS — N183 Chronic kidney disease, stage 3 unspecified: Secondary | ICD-10-CM | POA: Insufficient documentation

## 2020-02-19 DIAGNOSIS — Z7951 Long term (current) use of inhaled steroids: Secondary | ICD-10-CM | POA: Diagnosis not present

## 2020-02-19 DIAGNOSIS — K625 Hemorrhage of anus and rectum: Secondary | ICD-10-CM | POA: Diagnosis not present

## 2020-02-19 DIAGNOSIS — I130 Hypertensive heart and chronic kidney disease with heart failure and stage 1 through stage 4 chronic kidney disease, or unspecified chronic kidney disease: Secondary | ICD-10-CM | POA: Diagnosis not present

## 2020-02-19 DIAGNOSIS — I5032 Chronic diastolic (congestive) heart failure: Secondary | ICD-10-CM | POA: Insufficient documentation

## 2020-02-19 DIAGNOSIS — D649 Anemia, unspecified: Secondary | ICD-10-CM | POA: Diagnosis not present

## 2020-02-19 DIAGNOSIS — Z79899 Other long term (current) drug therapy: Secondary | ICD-10-CM | POA: Insufficient documentation

## 2020-02-19 DIAGNOSIS — J449 Chronic obstructive pulmonary disease, unspecified: Secondary | ICD-10-CM | POA: Insufficient documentation

## 2020-02-19 DIAGNOSIS — Z86010 Personal history of colonic polyps: Secondary | ICD-10-CM | POA: Diagnosis not present

## 2020-02-19 DIAGNOSIS — Z87891 Personal history of nicotine dependence: Secondary | ICD-10-CM | POA: Diagnosis not present

## 2020-02-19 DIAGNOSIS — D594 Other nonautoimmune hemolytic anemias: Secondary | ICD-10-CM | POA: Insufficient documentation

## 2020-02-19 LAB — TYPE AND SCREEN
ABO/RH(D): O POS
Antibody Screen: POSITIVE

## 2020-02-19 LAB — COMPREHENSIVE METABOLIC PANEL
ALT: 10 U/L (ref 0–44)
AST: 14 U/L — ABNORMAL LOW (ref 15–41)
Albumin: 3.6 g/dL (ref 3.5–5.0)
Alkaline Phosphatase: 59 U/L (ref 38–126)
Anion gap: 9 (ref 5–15)
BUN: 38 mg/dL — ABNORMAL HIGH (ref 8–23)
CO2: 29 mmol/L (ref 22–32)
Calcium: 9 mg/dL (ref 8.9–10.3)
Chloride: 102 mmol/L (ref 98–111)
Creatinine, Ser: 1.8 mg/dL — ABNORMAL HIGH (ref 0.44–1.00)
GFR calc Af Amer: 30 mL/min — ABNORMAL LOW (ref >60)
GFR calc non Af Amer: 26 mL/min — ABNORMAL LOW (ref >60)
Glucose, Bld: 118 mg/dL — ABNORMAL HIGH (ref 70–99)
Potassium: 4.1 mmol/L (ref 3.5–5.1)
Sodium: 140 mmol/L (ref 135–145)
Total Bilirubin: <0.1 mg/dL — ABNORMAL LOW (ref 0.3–1.2)
Total Protein: 7.1 g/dL (ref 6.5–8.1)

## 2020-02-19 LAB — CBC
HCT: 25.2 % — ABNORMAL LOW (ref 36.0–46.0)
Hemoglobin: 7.6 g/dL — ABNORMAL LOW (ref 12.0–15.0)
MCH: 26.4 pg (ref 26.0–34.0)
MCHC: 30.2 g/dL (ref 30.0–36.0)
MCV: 87.5 fL (ref 80.0–100.0)
Platelets: 202 10*3/uL (ref 150–400)
RBC: 2.88 MIL/uL — ABNORMAL LOW (ref 3.87–5.11)
RDW: 14.8 % (ref 11.5–15.5)
WBC: 9.4 10*3/uL (ref 4.0–10.5)
nRBC: 0 % (ref 0.0–0.2)

## 2020-02-19 NOTE — Telephone Encounter (Signed)
Called pt back and she stated that on Wednesday she passed a large amount of dark blood during BM. Pt stated that she experience the same thing this morning.   Pt is feeling tired, weak and shaky.   Pt informed to go to the ED. I explained that her last hemoglobin was 9.1 and that is lower than it was in April (even after the iron infusion), there could be other labs that are causing her symptoms of fatigue and shaky but that the ED would be able to test for all of them and then do an infusion if needed, etc.   Pt agreed and will find someone to take care of her dtr and take her to the ED.

## 2020-02-19 NOTE — ED Triage Notes (Signed)
Patient arrived by self from home. Patient c/o rectal bleeding that started 2 weeks ago. Patient stated blood started and then stopped, blood reappeared in stool 2 days ago.   Patient stated they feel weak and shaky.   Patient received blood infusion in the past.

## 2020-02-19 NOTE — Telephone Encounter (Signed)
° ° °  Patient calling to discuss last hemoglobin results Patient states she had episode of blood in stool

## 2020-02-19 NOTE — ED Provider Notes (Signed)
[facility] DEPT Provider Note   CSN: [id number] Arrival date & time: 02/19/20  1304     History Chief Complaint  Patient presents with  . Rectal Bleeding    [name] is a 79 y.o. female with history significant for A. Fib not anticoagulated, CHF, CKD, chronic anemia, pretension who presents for evaluation of weakness and rectal bleeding.  Patient has had intermittent dark red blood per rectum x2 weeks.  Had a large volume bowel movement this morning with melanotic stool.  Has had some generalized weakness and dyspnea on exertion.  Has history of chronic anemia has required iron transfusions and blood transfusions in the past.  Last iron transfusion 11/2019.  Has had some increasing left lower quadrant pain.  Was seen by PCP on 02/04/20 for similar complaints.  Had CT scan which did not show any evidence of diverticulitis.  No fever, chills, emesis, hematuria, dysuria, rashes  On initial evaluation patient states she wants to leave as "I have been waiting for 4 hours already and I called my husband for a ride home."  History obtained from patient and past medical records. No interpretor was used.  HPI     Past Medical History:  Diagnosis Date  . Allergy   . Angiodysplasia of cecum   . Angiodysplasia of duodenum   . Arthritis   . Asteatotic eczema 02/25/2016  . Asthma   . Atrial fibrillation ([place]) 10/17/2017  . Blood transfusion without reported diagnosis   . Cataract   . Chest pain at rest 03/22/2010   Qualifier: Diagnosis of  By: Ronnald Ramp MD, Arvid Right.   . CHF (congestive heart failure) ([place]) 08/31/03  . CHF (congestive heart failure), NYHA class III, chronic, combined ([place]) 08/10/2017  . Chronic venous stasis dermatitis of both lower extremities 06/02/2013  . CKD (chronic kidney disease), stage III 06/20/2010   Estimated Creatinine Clearance: 34.8 mL/min (A) (by C-G formula based on SCr of 1.37 mg/dL (H)).   Marland Kitchen COLD (chronic obstructive lung disease)  ([place])   . Colonic polyp 02/16/2008   Tubular adenoma  . COPD (chronic obstructive pulmonary disease) ([place]) 01/27/98  . Cor pulmonale ([place]) 11/16/2014  . D-dimer, elevated 01/01/2018  . GERD 07/30/1992  . GERD (gastroesophageal reflux disease) 07/30/92  . Gout   . Heart murmur   . HTN (hypertension) 07/30/1988  . Hyperlipidemia 11/27/96  . Hyperlipidemia with target LDL less than 100 11/27/1996       . Kidney stone 1960, 1972, 1991  . MGUS (monoclonal gammopathy of unknown significance) 11/08/2016  . Morbid obesity ([place]) 04/19/2010   She agrees to work on her lifestyle modifications to help her lose weight.   . Neck pain on right side 01/01/2018  . OSA (obstructive sleep apnea) 06/22/2013  . Osteopenia, senile 02/05/2013   July 2014  -2.1 left femur -1.9 left forearm   . Other screening mammogram 02/05/2013  . Oxygen deficiency   . Prediabetes 11/13/2006  . Renal insufficiency   . Stenosis of cervical spine with myelopathy ([place]) 01/01/2018  . Ulcer of leg, chronic, right ([place]) 10/10/2011  . Ulcerative colitis   . ULCERATIVE COLITIS-LEFT SIDE 03/19/2008       . Vitamin B12 deficiency anemia 11/13/2006       . Vitamin D deficiency 02/06/2013    Patient Active Problem List   Diagnosis Date Noted  . Bandemia 02/06/2020  . Left lower quadrant abdominal tenderness without rebound tenderness 02/06/2020  . Chronic respiratory failure with hypoxia (H[facility])  02/06/2020  . Routine general medical examination at a health care facility 02/06/2020  . Umbilical hernia with obstruction 08/27/2018  . AVM (arteriovenous malformation) of colon 08/14/2018  . Hernia, ventral 08/12/2018  . Multiple lung nodules on CT 05/05/2018  . Stenosis of cervical spine with myelopathy ([place]) 01/01/2018  . Atrial fibrillation ([place]) 10/17/2017  . CHF (congestive heart failure), NYHA class III, chronic, diastolic ([place]) 40/98/1191  . Angiodysplasia of cecum   . Angiodysplasia of duodenum   . Grade III diastolic dysfunction 47/82/9562   . MGUS (monoclonal gammopathy of unknown significance) 11/08/2016  . Asteatotic eczema 02/25/2016  . COLD (chronic obstructive lung disease) ([place])   . Anemia, iron deficiency 11/16/2014  . Cor pulmonale ([place]) 11/16/2014  . OSA (obstructive sleep apnea) 06/22/2013  . Chronic venous stasis dermatitis of both lower extremities 06/02/2013  . Vitamin D deficiency 02/06/2013  . Osteopenia, senile 02/05/2013  . Other screening mammogram 02/05/2013  . CKD (chronic kidney disease), stage III 06/20/2010  . Morbid obesity ([place]) 04/19/2010  . Gout 01/10/2009  . ULCERATIVE COLITIS-LEFT SIDE 03/19/2008  . Vitamin B12 deficiency anemia 11/13/2006  . Hyperlipidemia with target LDL less than 100 11/27/1996  . GERD 07/30/1992  . HTN (hypertension) 07/30/1988    Past Surgical History:  Procedure Laterality Date  . BRONCHOSCOPY    . COLONOSCOPY    . COLONOSCOPY WITH PROPOFOL N/A 02/23/2015   Procedure: COLONOSCOPY WITH PROPOFOL;  Surgeon: Ladene Artist, MD;  Location: [facility];  Service: Endoscopy;  Laterality: N/A;  . COLONOSCOPY WITH PROPOFOL N/A 08/12/2018   Procedure: COLONOSCOPY WITH PROPOFOL;  Surgeon: Ladene Artist, MD;  Location: [facility];  Service: Endoscopy;  Laterality: N/A;  . ESOPHAGOGASTRODUODENOSCOPY (EGD) WITH PROPOFOL N/A 02/23/2015   Procedure: ESOPHAGOGASTRODUODENOSCOPY (EGD) WITH PROPOFOL;  Surgeon: Ladene Artist, MD;  Location: [facility];  Service: Endoscopy;  Laterality: N/A;  . ESOPHAGOGASTRODUODENOSCOPY (EGD) WITH PROPOFOL N/A 08/12/2018   Procedure: ESOPHAGOGASTRODUODENOSCOPY (EGD) WITH PROPOFOL;  Surgeon: Ladene Artist, MD;  Location: [facility];  Service: Endoscopy;  Laterality: N/A;  . HOT HEMOSTASIS N/A 08/12/2018   Procedure: HOT HEMOSTASIS (ARGON PLASMA COAGULATION/BICAP);  Surgeon: Ladene Artist, MD;  Location: [facility];  Service: Endoscopy;  Laterality: N/A;  EGD and Colon APC  . NO PAST SURGERIES    . POLYPECTOMY       OB History   No  obstetric history on file.     Family History  Problem Relation Age of Onset  . Stroke Mother   . Diabetes Mother   . Kidney cancer Mother   . Stomach cancer Mother   . Heart disease Mother   . Stroke Father   . Heart disease Brother   . Heart disease Brother   . Crohn's disease Brother   . Heart disease Sister        CATH,STENT  . Clotting disorder Sister   . Cervical cancer Sister   . Lung cancer Sister   . Heart attack Sister   . Diabetes Sister   . Colon cancer Neg Hx   . Esophageal cancer Neg Hx   . Rectal cancer Neg Hx     Social History   Tobacco Use  . Smoking status: Former Smoker    Quit date: 06/01/1996    Years since quitting: 23.7  . Smokeless tobacco: Never Used  . Tobacco comment: Quit 1998  Vaping Use  . Vaping Use: Never used  Substance Use Topics  . Alcohol use: No  . Drug use:  No    Home Medications Prior to Admission medications   Medication Sig Start Date End Date Taking? Authorizing Provider  acetaminophen (TYLENOL) 325 MG tablet Take 325-650 mg by mouth daily as needed for moderate pain, fever or headache.     [provider]  albuterol (PROAIR HFA) 108 (90 Base) MCG/ACT inhaler Inhale 1-2 puffs into the lungs every 6 (six) hours as needed for wheezing or shortness of breath. 10/30/16   [name], MD  allopurinol (ZYLOPRIM) 100 MG tablet TAKE 1 TABLET BY MOUTH EVERY DAY FOR GOUT 12/02/19   [name], MD  BIDIL 20-37.5 MG tablet TAKE 1 TABLET BY MOUTH THREE TIMES DAILY 11/11/19   [name], MD  BREO ELLIPTA 100-25 MCG/INH AEPB INHALE 1 PUFF INTO THE LUNGS DAILY 10/25/19   [name], MD  ferrous sulfate 325 (65 FE) MG tablet Take 1 tablet (325 mg total) by mouth 2 (two) times daily with a meal. 11/13/19   [name], MD  Pitavastatin Calcium 1 MG TABS Take 1 tablet (1 mg total) by mouth daily. 02/05/20   [name], MD  torsemide (DEMADEX) 20 MG tablet TAKE 1 TABLET(20 MG) BY MOUTH TWICE DAILY 01/25/20   [name], MD  allopurinol (ZYLOPRIM) 100 MG tablet One po QD 06/09/19   [name], MD  fluticasone furoate-vilanterol (BREO ELLIPTA) 100-25 MCG/INH AEPB INHALE 1 PUFF INTO THE LUNGS DAILY 04/23/19   [name], MD  torsemide (DEMADEX) 20 MG tablet TAKE 1 TABLET(20 MG) BY MOUTH TWICE DAILY 08/03/19   [name], MD  torsemide (DEMADEX) 20 MG tablet TAKE 1 TABLET(20 MG) BY MOUTH TWICE DAILY 10/25/19   [name], MD    Allergies    Amoxicillin, Celebrex [celecoxib], Enalapril, Lipitor [atorvastatin], Amlodipine besylate, Codeine sulfate, and Hydrocodone-acetaminophen  Review of Systems   Review of Systems  Constitutional: Positive for fatigue.  Respiratory: Positive for shortness of breath (DOE). Negative for apnea, cough, choking, chest tightness, wheezing and stridor.   Cardiovascular: Negative.   Gastrointestinal: Positive for abdominal pain (LLQ) and blood in stool. Negative for nausea and vomiting.  Genitourinary: Negative.   Musculoskeletal: Negative.   Skin: Negative.   Neurological: Positive for weakness (Generalized ).  All other systems reviewed and are negative.   Physical Exam Updated Vital Signs BP (!) 218/76   Pulse 79   Temp 98 F (36.7 C) (Oral)   Resp 19   Ht 5' (1.524 m)   Wt (!) 93 kg   SpO2 97%   BMI 40.04 kg/m   Physical Exam Vitals and nursing note reviewed.  Constitutional:      General: She is not in acute distress.    Appearance: She is well-developed. She is not ill-appearing, toxic-appearing or diaphoretic.     Comments: Patient refused detailed exam  HENT:     Head: Atraumatic.  Eyes:     Pupils: Pupils are equal, round, and reactive to light.  Cardiovascular:     Rate and Rhythm: Normal rate and regular rhythm.  Pulmonary:     Effort: Pulmonary effort is normal. No respiratory distress.  Abdominal:     General: There is no distension.     Palpations: Abdomen is soft.     Comments: Unable to assess. Patient sitting in  chair and would not let me examine her  Genitourinary:    Comments: Refused occult exam Musculoskeletal:        General: Normal range  of motion.     Cervical back: Normal range of motion and neck supple.  Skin:    General: Skin is warm and dry.  Neurological:     Mental Status: She is alert.     Comments: Ambulatory in room without difficulty    ED Results / Procedures / Treatments   Labs (all labs ordered are listed, but only abnormal results are displayed) Labs Reviewed  COMPREHENSIVE METABOLIC PANEL - Abnormal; Notable for the following components:      Result Value   Glucose, Bld 118 (*)    BUN 38 (*)    Creatinine, Ser 1.80 (*)    AST 14 (*)    Total Bilirubin <0.1 (*)    GFR calc non Af Amer 26 (*)    GFR calc Af Amer 30 (*)    All other components within normal limits  CBC - Abnormal; Notable for the following components:   RBC 2.88 (*)    Hemoglobin 7.6 (*)    HCT 25.2 (*)    All other components within normal limits  POC OCCULT BLOOD, ED  TYPE AND SCREEN    EKG None  Radiology No results found.  Procedures Procedures (including critical care time)  Medications Ordered in ED Medications - No data to display  ED Course  I have reviewed the triage vital signs and the nursing notes.  Pertinent labs & imaging results that were available during my care of the patient were reviewed by me and considered in my medical decision making (see chart for details).  79 year old female presented for rectal bleeding with generalized weakness, DOE and feeling shaky. Afebrile non septic appearing. Hypertensive with triage. Unfortunately patient had extended wait time out in lobby and is refusing additional work-up here in the emergency department including detailed exam.  I discussed with patient her symptoms were concerning for symptomatic anemia and further workup for her DOE.Requesting dc at initial evaluation by this provider.  Labs personally reviewed from triage and  interpreted:  CBC without leukocytosis, hemoglobin 7.6 downtrending from 9.1, 10.0 previously CMP with creatinine 38 however similar to prior, creatinine 1.8 up from 1.36 previously Occult not collected as patient refused further exam.  I discussed labs obtained from triage.  Patient requesting discharge.  I discussed leave Barry with Ethelene Browns, NT and Nila Nephew, RN in room. Patient voiced understanding of leaving AMA. Discussed close follow up with PCP as patient states she would prefer to get a blood transfusion outpatient if needed.   We discussed the nature and purpose, risks and benefits, as well as, the alternatives of treatment. Time was given to allow the opportunity to ask questions and consider their options, and after the discussion, the patient decided to refuse the offerred treatment. The patient was informed that refusal could lead to, but was not limited to, death, permanent disability, or severe pain. If present, I asked the relatives or significant others to dissuade them without success. Prior to refusing, I determined that the patient had the capacity to make their decision and understood the consequences of that decision. After refusal, I made every reasonable opportunity to treat them to the best of my ability.  The patient was notified that they may return to the emergency department at any time for further treatment.      MDM Rules/Calculators/A&P  Final Clinical Impression(s) / ED Diagnoses Final diagnoses:  Symptomatic anemia  Rectal bleeding    Rx / DC Orders ED Discharge Orders    None       Joetta Delprado A, PA-C 02/19/20 1729    Virgel Manifold, MD 02/20/20 2245

## 2020-02-22 ENCOUNTER — Ambulatory Visit: Payer: Medicare Other | Admitting: Internal Medicine

## 2020-02-22 DIAGNOSIS — L03119 Cellulitis of unspecified part of limb: Secondary | ICD-10-CM | POA: Diagnosis not present

## 2020-02-22 DIAGNOSIS — J449 Chronic obstructive pulmonary disease, unspecified: Secondary | ICD-10-CM | POA: Diagnosis not present

## 2020-02-22 NOTE — Telephone Encounter (Signed)
F/u   The patient is asking for the CMA to call her back.   The patient went to ED on Friday and did not stay due to a long wait - 5 hours.   Please advise on blood transfusion or iron infusion

## 2020-02-22 NOTE — Telephone Encounter (Signed)
Pt advised to go to ED on Friday for symptomatic anemia. Pt went to ED but left AMA.   Pt is asking about outpatient iron. Please advise.

## 2020-02-26 NOTE — Addendum Note (Signed)
Addended by: [name] on: 02/26/2020 11:37 AM   Modules accepted: Orders

## 2020-02-29 NOTE — Telephone Encounter (Signed)
[facility] called, appt made for 8/10 at 8a.  Pt notified of appt place & time & denies questions/concerns.

## 2020-03-08 ENCOUNTER — Ambulatory Visit (HOSPITAL_COMMUNITY)
Admission: RE | Admit: 2020-03-08 | Discharge: 2020-03-08 | Disposition: A | Discharge status: Home / Self Care | Payer: Medicare Other | Source: Ambulatory Visit | Attending: Internal Medicine | Admitting: Internal Medicine

## 2020-03-08 ENCOUNTER — Other Ambulatory Visit: Payer: Self-pay

## 2020-03-08 DIAGNOSIS — D500 Iron deficiency anemia secondary to blood loss (chronic): Secondary | ICD-10-CM | POA: Diagnosis not present

## 2020-03-08 MED ORDER — SODIUM CHLORIDE 0.9 % IV SOLN
750.0000 mg | Freq: Once | INTRAVENOUS | Status: AC
  Administered 2020-03-08: 750 mg via INTRAVENOUS
  Filled 2020-03-08: qty 15

## 2020-03-08 MED ORDER — SODIUM CHLORIDE 0.9 % IV SOLN
INTRAVENOUS | Status: DC | PRN
  Administered 2020-03-08: 250 mL via INTRAVENOUS

## 2020-03-08 NOTE — Progress Notes (Signed)
Patient received IV Injectafer as ordered by [name] MD. Observed for at least 30 minutes post infusion.Tolerated well, vitals stable, discharge instructions given, verbalized understanding. Patient alert, oriented and ambulatory at the time of discharge.

## 2020-03-08 NOTE — Discharge Instructions (Signed)

## 2020-03-09 DIAGNOSIS — E119 Type 2 diabetes mellitus without complications: Secondary | ICD-10-CM | POA: Diagnosis not present

## 2020-03-09 DIAGNOSIS — H2513 Age-related nuclear cataract, bilateral: Secondary | ICD-10-CM | POA: Diagnosis not present

## 2020-03-09 DIAGNOSIS — H353122 Nonexudative age-related macular degeneration, left eye, intermediate dry stage: Secondary | ICD-10-CM | POA: Diagnosis not present

## 2020-03-09 DIAGNOSIS — H43813 Vitreous degeneration, bilateral: Secondary | ICD-10-CM | POA: Diagnosis not present

## 2020-03-24 DIAGNOSIS — L03119 Cellulitis of unspecified part of limb: Secondary | ICD-10-CM | POA: Diagnosis not present

## 2020-03-24 DIAGNOSIS — J449 Chronic obstructive pulmonary disease, unspecified: Secondary | ICD-10-CM | POA: Diagnosis not present

## 2020-04-24 DIAGNOSIS — L03119 Cellulitis of unspecified part of limb: Secondary | ICD-10-CM | POA: Diagnosis not present

## 2020-04-24 DIAGNOSIS — J449 Chronic obstructive pulmonary disease, unspecified: Secondary | ICD-10-CM | POA: Diagnosis not present

## 2020-04-25 ENCOUNTER — Telehealth: Payer: Self-pay | Admitting: Internal Medicine

## 2020-04-25 ENCOUNTER — Other Ambulatory Visit: Payer: Self-pay | Admitting: Internal Medicine

## 2020-04-25 DIAGNOSIS — I10 Essential (primary) hypertension: Secondary | ICD-10-CM

## 2020-04-25 DIAGNOSIS — I5042 Chronic combined systolic (congestive) and diastolic (congestive) heart failure: Secondary | ICD-10-CM

## 2020-04-25 NOTE — Progress Notes (Signed)
  Chronic Care Management   Note  04/25/2020 Name: [name] MRN: [id number] DOB: 08-30-1940  [name] is a 79 y.o. year old female who is a primary care patient of [name], MD. I reached out to [name] by phone today in response to a referral sent by [name]'s PCP, [name], MD.   [name] was given information about Chronic Care Management services today including:  1. CCM service includes personalized support from designated clinical staff supervised by her physician, including individualized plan of care and coordination with other care providers 2. 24/7 contact phone numbers for assistance for urgent and routine care needs. 3. Service will only be billed when office clinical staff spend 20 minutes or more in a month to coordinate care. 4. Only one practitioner may furnish and bill the service in a calendar month. 5. The patient may stop CCM services at any time (effective at the end of the month) by phone call to the office staff.   Patient agreed to services and verbal consent obtained.   Follow up plan:   Carley Perdue [employer] Scheduler

## 2020-05-03 ENCOUNTER — Telehealth: Payer: Self-pay | Admitting: Internal Medicine

## 2020-05-03 NOTE — Telephone Encounter (Signed)
    Patient requesting order to test Iron level. She states she feels fatigued

## 2020-05-06 ENCOUNTER — Other Ambulatory Visit: Payer: Self-pay | Admitting: Internal Medicine

## 2020-05-06 DIAGNOSIS — R739 Hyperglycemia, unspecified: Secondary | ICD-10-CM

## 2020-05-06 DIAGNOSIS — N1832 Chronic kidney disease, stage 3b: Secondary | ICD-10-CM

## 2020-05-06 DIAGNOSIS — D500 Iron deficiency anemia secondary to blood loss (chronic): Secondary | ICD-10-CM

## 2020-05-06 NOTE — Telephone Encounter (Addendum)
    Patient advised there is an order for Iron Binding Cap in Epic from July Patient is requesting additional labs Offered patient appointment with another provider, she declined. She states she only wants to see Dr Ronnald Ramp. She states she will go to UC/ED if she continues to feel weak

## 2020-05-07 ENCOUNTER — Encounter (HOSPITAL_COMMUNITY): Payer: Self-pay | Admitting: *Deleted

## 2020-05-07 ENCOUNTER — Inpatient Hospital Stay (HOSPITAL_COMMUNITY)
Admission: EM | Admit: 2020-05-07 | Discharge: 2020-05-12 | DRG: 378 | Disposition: A | Discharge status: Home / Self Care | Payer: Medicare Other | Attending: Internal Medicine | Admitting: Internal Medicine

## 2020-05-07 ENCOUNTER — Other Ambulatory Visit: Payer: Self-pay

## 2020-05-07 DIAGNOSIS — Z801 Family history of malignant neoplasm of trachea, bronchus and lung: Secondary | ICD-10-CM

## 2020-05-07 DIAGNOSIS — Z823 Family history of stroke: Secondary | ICD-10-CM | POA: Diagnosis not present

## 2020-05-07 DIAGNOSIS — I2781 Cor pulmonale (chronic): Secondary | ICD-10-CM | POA: Diagnosis not present

## 2020-05-07 DIAGNOSIS — J9611 Chronic respiratory failure with hypoxia: Secondary | ICD-10-CM | POA: Diagnosis not present

## 2020-05-07 DIAGNOSIS — N1832 Chronic kidney disease, stage 3b: Secondary | ICD-10-CM | POA: Diagnosis present

## 2020-05-07 DIAGNOSIS — D62 Acute posthemorrhagic anemia: Secondary | ICD-10-CM | POA: Diagnosis not present

## 2020-05-07 DIAGNOSIS — K922 Gastrointestinal hemorrhage, unspecified: Secondary | ICD-10-CM | POA: Diagnosis not present

## 2020-05-07 DIAGNOSIS — Z888 Allergy status to other drugs, medicaments and biological substances status: Secondary | ICD-10-CM

## 2020-05-07 DIAGNOSIS — I872 Venous insufficiency (chronic) (peripheral): Secondary | ICD-10-CM | POA: Diagnosis present

## 2020-05-07 DIAGNOSIS — K219 Gastro-esophageal reflux disease without esophagitis: Secondary | ICD-10-CM | POA: Diagnosis not present

## 2020-05-07 DIAGNOSIS — K420 Umbilical hernia with obstruction, without gangrene: Secondary | ICD-10-CM | POA: Diagnosis present

## 2020-05-07 DIAGNOSIS — I130 Hypertensive heart and chronic kidney disease with heart failure and stage 1 through stage 4 chronic kidney disease, or unspecified chronic kidney disease: Secondary | ICD-10-CM | POA: Diagnosis present

## 2020-05-07 DIAGNOSIS — K5730 Diverticulosis of large intestine without perforation or abscess without bleeding: Secondary | ICD-10-CM | POA: Diagnosis present

## 2020-05-07 DIAGNOSIS — J411 Mucopurulent chronic bronchitis: Secondary | ICD-10-CM | POA: Diagnosis not present

## 2020-05-07 DIAGNOSIS — Z20822 Contact with and (suspected) exposure to covid-19: Secondary | ICD-10-CM | POA: Diagnosis present

## 2020-05-07 DIAGNOSIS — Z539 Procedure and treatment not carried out, unspecified reason: Secondary | ICD-10-CM | POA: Diagnosis not present

## 2020-05-07 DIAGNOSIS — Z800 Family history of malignant neoplasm of digestive organs: Secondary | ICD-10-CM

## 2020-05-07 DIAGNOSIS — I4891 Unspecified atrial fibrillation: Secondary | ICD-10-CM | POA: Diagnosis present

## 2020-05-07 DIAGNOSIS — K640 First degree hemorrhoids: Secondary | ICD-10-CM | POA: Diagnosis not present

## 2020-05-07 DIAGNOSIS — I440 Atrioventricular block, first degree: Secondary | ICD-10-CM | POA: Diagnosis not present

## 2020-05-07 DIAGNOSIS — I480 Paroxysmal atrial fibrillation: Secondary | ICD-10-CM | POA: Diagnosis present

## 2020-05-07 DIAGNOSIS — E785 Hyperlipidemia, unspecified: Secondary | ICD-10-CM | POA: Diagnosis not present

## 2020-05-07 DIAGNOSIS — Z87891 Personal history of nicotine dependence: Secondary | ICD-10-CM

## 2020-05-07 DIAGNOSIS — I5032 Chronic diastolic (congestive) heart failure: Secondary | ICD-10-CM | POA: Diagnosis not present

## 2020-05-07 DIAGNOSIS — N183 Chronic kidney disease, stage 3 unspecified: Secondary | ICD-10-CM | POA: Diagnosis present

## 2020-05-07 DIAGNOSIS — I5042 Chronic combined systolic (congestive) and diastolic (congestive) heart failure: Secondary | ICD-10-CM | POA: Diagnosis present

## 2020-05-07 DIAGNOSIS — Z8049 Family history of malignant neoplasm of other genital organs: Secondary | ICD-10-CM

## 2020-05-07 DIAGNOSIS — D649 Anemia, unspecified: Secondary | ICD-10-CM | POA: Diagnosis not present

## 2020-05-07 DIAGNOSIS — K921 Melena: Secondary | ICD-10-CM | POA: Diagnosis not present

## 2020-05-07 DIAGNOSIS — D849 Immunodeficiency, unspecified: Secondary | ICD-10-CM | POA: Diagnosis present

## 2020-05-07 DIAGNOSIS — Z01810 Encounter for preprocedural cardiovascular examination: Secondary | ICD-10-CM | POA: Diagnosis not present

## 2020-05-07 DIAGNOSIS — D509 Iron deficiency anemia, unspecified: Secondary | ICD-10-CM | POA: Diagnosis present

## 2020-05-07 DIAGNOSIS — D500 Iron deficiency anemia secondary to blood loss (chronic): Secondary | ICD-10-CM | POA: Diagnosis not present

## 2020-05-07 DIAGNOSIS — Z79899 Other long term (current) drug therapy: Secondary | ICD-10-CM

## 2020-05-07 DIAGNOSIS — M109 Gout, unspecified: Secondary | ICD-10-CM | POA: Diagnosis not present

## 2020-05-07 DIAGNOSIS — K31819 Angiodysplasia of stomach and duodenum without bleeding: Secondary | ICD-10-CM

## 2020-05-07 DIAGNOSIS — R011 Cardiac murmur, unspecified: Secondary | ICD-10-CM | POA: Diagnosis not present

## 2020-05-07 DIAGNOSIS — Z880 Allergy status to penicillin: Secondary | ICD-10-CM

## 2020-05-07 DIAGNOSIS — J449 Chronic obstructive pulmonary disease, unspecified: Secondary | ICD-10-CM | POA: Diagnosis present

## 2020-05-07 DIAGNOSIS — Z8249 Family history of ischemic heart disease and other diseases of the circulatory system: Secondary | ICD-10-CM

## 2020-05-07 DIAGNOSIS — Z833 Family history of diabetes mellitus: Secondary | ICD-10-CM

## 2020-05-07 DIAGNOSIS — Z7951 Long term (current) use of inhaled steroids: Secondary | ICD-10-CM

## 2020-05-07 DIAGNOSIS — Z885 Allergy status to narcotic agent status: Secondary | ICD-10-CM

## 2020-05-07 DIAGNOSIS — R001 Bradycardia, unspecified: Secondary | ICD-10-CM | POA: Diagnosis not present

## 2020-05-07 DIAGNOSIS — G4733 Obstructive sleep apnea (adult) (pediatric): Secondary | ICD-10-CM | POA: Diagnosis present

## 2020-05-07 DIAGNOSIS — Z6838 Body mass index (BMI) 38.0-38.9, adult: Secondary | ICD-10-CM

## 2020-05-07 DIAGNOSIS — N179 Acute kidney failure, unspecified: Secondary | ICD-10-CM | POA: Diagnosis present

## 2020-05-07 DIAGNOSIS — I5033 Acute on chronic diastolic (congestive) heart failure: Secondary | ICD-10-CM | POA: Diagnosis present

## 2020-05-07 DIAGNOSIS — Z886 Allergy status to analgesic agent status: Secondary | ICD-10-CM

## 2020-05-07 DIAGNOSIS — K5521 Angiodysplasia of colon with hemorrhage: Secondary | ICD-10-CM | POA: Diagnosis not present

## 2020-05-07 DIAGNOSIS — Z9981 Dependence on supplemental oxygen: Secondary | ICD-10-CM

## 2020-05-07 DIAGNOSIS — Z8051 Family history of malignant neoplasm of kidney: Secondary | ICD-10-CM

## 2020-05-07 DIAGNOSIS — I10 Essential (primary) hypertension: Secondary | ICD-10-CM | POA: Diagnosis present

## 2020-05-07 DIAGNOSIS — Z832 Family history of diseases of the blood and blood-forming organs and certain disorders involving the immune mechanism: Secondary | ICD-10-CM

## 2020-05-07 LAB — COMPREHENSIVE METABOLIC PANEL
ALT: 10 U/L (ref 0–44)
AST: 13 U/L — ABNORMAL LOW (ref 15–41)
Albumin: 3.4 g/dL — ABNORMAL LOW (ref 3.5–5.0)
Alkaline Phosphatase: 51 U/L (ref 38–126)
Anion gap: 8 (ref 5–15)
BUN: 39 mg/dL — ABNORMAL HIGH (ref 8–23)
CO2: 26 mmol/L (ref 22–32)
Calcium: 8.7 mg/dL — ABNORMAL LOW (ref 8.9–10.3)
Chloride: 103 mmol/L (ref 98–111)
Creatinine, Ser: 1.55 mg/dL — ABNORMAL HIGH (ref 0.44–1.00)
GFR, Estimated: 32 mL/min — ABNORMAL LOW (ref >60)
Glucose, Bld: 140 mg/dL — ABNORMAL HIGH (ref 70–99)
Potassium: 3.6 mmol/L (ref 3.5–5.1)
Sodium: 137 mmol/L (ref 135–145)
Total Bilirubin: 0.4 mg/dL (ref 0.3–1.2)
Total Protein: 6.6 g/dL (ref 6.5–8.1)

## 2020-05-07 LAB — RESPIRATORY PANEL BY RT PCR (FLU A&B, COVID)
Influenza A by PCR: NEGATIVE
Influenza B by PCR: NEGATIVE
SARS Coronavirus 2 by RT PCR: NEGATIVE

## 2020-05-07 LAB — CBC
HCT: 20.6 % — ABNORMAL LOW (ref 36.0–46.0)
Hemoglobin: 6.3 g/dL — CL (ref 12.0–15.0)
MCH: 26.9 pg (ref 26.0–34.0)
MCHC: 30.6 g/dL (ref 30.0–36.0)
MCV: 88 fL (ref 80.0–100.0)
Platelets: 251 10*3/uL (ref 150–400)
RBC: 2.34 MIL/uL — ABNORMAL LOW (ref 3.87–5.11)
RDW: 14.4 % (ref 11.5–15.5)
WBC: 10.2 10*3/uL (ref 4.0–10.5)
nRBC: 0 % (ref 0.0–0.2)

## 2020-05-07 LAB — HEMOGLOBIN AND HEMATOCRIT, BLOOD
HCT: 26.1 % — ABNORMAL LOW (ref 36.0–46.0)
Hemoglobin: 8.4 g/dL — ABNORMAL LOW (ref 12.0–15.0)

## 2020-05-07 LAB — PREPARE RBC (CROSSMATCH)

## 2020-05-07 MED ORDER — ONDANSETRON HCL 4 MG PO TABS: 4.0000 mg | ORAL_TABLET | Freq: Four times a day (QID) | ORAL | Status: DC | PRN

## 2020-05-07 MED ORDER — PANTOPRAZOLE SODIUM 40 MG IV SOLR
40.0000 mg | Freq: Two times a day (BID) | INTRAVENOUS | Status: DC
  Administered 2020-05-07 – 2020-05-12 (×9): 40 mg via INTRAVENOUS
  Filled 2020-05-07 (×9): qty 40

## 2020-05-07 MED ORDER — PRAVASTATIN SODIUM 20 MG PO TABS
20.0000 mg | ORAL_TABLET | Freq: Every day | ORAL | Status: DC
  Administered 2020-05-07 – 2020-05-11 (×5): 20 mg via ORAL
  Filled 2020-05-07 (×5): qty 1

## 2020-05-07 MED ORDER — ALBUTEROL SULFATE (2.5 MG/3ML) 0.083% IN NEBU: 2.5000 mg | INHALATION_SOLUTION | RESPIRATORY_TRACT | Status: DC | PRN

## 2020-05-07 MED ORDER — ALLOPURINOL 100 MG PO TABS
100.0000 mg | ORAL_TABLET | Freq: Every day | ORAL | Status: DC
  Administered 2020-05-08 – 2020-05-12 (×4): 100 mg via ORAL
  Filled 2020-05-07 (×5): qty 1

## 2020-05-07 MED ORDER — ONDANSETRON HCL 4 MG/2ML IJ SOLN: 4.0000 mg | Freq: Four times a day (QID) | INTRAMUSCULAR | Status: DC | PRN

## 2020-05-07 MED ORDER — ISOSORB DINITRATE-HYDRALAZINE 20-37.5 MG PO TABS
1.0000 | ORAL_TABLET | Freq: Three times a day (TID) | ORAL | Status: DC
  Administered 2020-05-07 – 2020-05-12 (×14): 1 via ORAL
  Filled 2020-05-07 (×18): qty 1

## 2020-05-07 MED ORDER — SODIUM CHLORIDE 0.9 % IV SOLN
10.0000 mL/h | Freq: Once | INTRAVENOUS | Status: AC
  Administered 2020-05-07: 10 mL/h via INTRAVENOUS

## 2020-05-07 MED ORDER — TORSEMIDE 20 MG PO TABS
20.0000 mg | ORAL_TABLET | Freq: Two times a day (BID) | ORAL | Status: DC
  Administered 2020-05-08 – 2020-05-12 (×8): 20 mg via ORAL
  Filled 2020-05-07 (×11): qty 1

## 2020-05-07 NOTE — H&P (Addendum)
History and Physical    AMRI LIEN ZWC:[id number] DOB: Dec 27, 1940 DOA: 05/07/2020  PCP: [name], MD  Patient coming from: Home  Chief Complaint: Rectal bleeded  HPI: [name] is a 79 y.o. female with medical history significant of recurrent GIB, HTN, combined HF. Presenting with 1 week of rectal bleeding and dark, sticky stools. She reports that last Friday she started having BRBPR. It persisted throughout the day. She had no accompanying pain, N, V. It seem to calm down a little bit over the weekend, but returned Monday. Her symptoms increased throughout the week. This morning she figured she had had enough and came to the ED. She states that she has felt increasingly fatigued and lightheaded during this time. She otherwise denies any aggravating or alleviating factors. She denies any other treatments.    ED Course: She was found to be FOBT positive. She was found to have an Hgb of 6.3. TRH was called for admission.   Review of Systems:  Review of systems is otherwise negative for all not mentioned in HPI.   PMHx Past Medical History:  Diagnosis Date   Allergy    Angiodysplasia of cecum    Angiodysplasia of duodenum    Arthritis    Asteatotic eczema 02/25/2016   Asthma    Atrial fibrillation ([place]) 10/17/2017   Blood transfusion without reported diagnosis    Cataract    Chest pain at rest 03/22/2010   Qualifier: Diagnosis of  By: Ronnald Ramp MD, Arvid Right.    CHF (congestive heart failure) ([place]) 08/31/03   CHF (congestive heart failure), NYHA class III, chronic, combined ([place]) 08/10/2017   Chronic venous stasis dermatitis of both lower extremities 06/02/2013   CKD (chronic kidney disease), stage III ([place]) 06/20/2010   Estimated Creatinine Clearance: 34.8 mL/min (A) (by C-G formula based on SCr of 1.37 mg/dL (H)).    COLD (chronic obstructive lung disease) ([place])    Colonic polyp 02/16/2008   Tubular adenoma   COPD (chronic obstructive pulmonary disease)  ([place]) 01/27/98   Cor pulmonale ([place]) 11/16/2014   D-dimer, elevated 01/01/2018   GERD 07/30/1992   GERD (gastroesophageal reflux disease) 07/30/92   Gout    Heart murmur    HTN (hypertension) 07/30/1988   Hyperlipidemia 11/27/96   Hyperlipidemia with target LDL less than 100 11/27/1996        Kidney stone 1960, 1972, 1991   MGUS (monoclonal gammopathy of unknown significance) 11/08/2016   Morbid obesity ([place]) 04/19/2010   She agrees to work on her lifestyle modifications to help her lose weight.    Neck pain on right side 01/01/2018   OSA (obstructive sleep apnea) 06/22/2013   Osteopenia, senile 02/05/2013   July 2014  -2.1 left femur -1.9 left forearm    Other screening mammogram 02/05/2013   Oxygen deficiency    Prediabetes 11/13/2006   Renal insufficiency    Stenosis of cervical spine with myelopathy ([place]) 01/01/2018   Ulcer of leg, chronic, right ([place]) 10/10/2011   Ulcerative colitis    ULCERATIVE COLITIS-LEFT SIDE 03/19/2008        Vitamin B12 deficiency anemia 11/13/2006        Vitamin D deficiency 02/06/2013    PSHx Past Surgical History:  Procedure Laterality Date   BRONCHOSCOPY     COLONOSCOPY     COLONOSCOPY WITH PROPOFOL N/A 02/23/2015   Procedure: COLONOSCOPY WITH PROPOFOL;  Surgeon: Ladene Artist, MD;  Location: [facility];  Service: Endoscopy;  Laterality: N/A;   COLONOSCOPY  WITH PROPOFOL N/A 08/12/2018   Procedure: COLONOSCOPY WITH PROPOFOL;  Surgeon: Ladene Artist, MD;  Location: [facility];  Service: Endoscopy;  Laterality: N/A;   ESOPHAGOGASTRODUODENOSCOPY (EGD) WITH PROPOFOL N/A 02/23/2015   Procedure: ESOPHAGOGASTRODUODENOSCOPY (EGD) WITH PROPOFOL;  Surgeon: Ladene Artist, MD;  Location: [facility];  Service: Endoscopy;  Laterality: N/A;   ESOPHAGOGASTRODUODENOSCOPY (EGD) WITH PROPOFOL N/A 08/12/2018   Procedure: ESOPHAGOGASTRODUODENOSCOPY (EGD) WITH PROPOFOL;  Surgeon: Ladene Artist, MD;  Location: [facility];  Service: Endoscopy;   Laterality: N/A;   HOT HEMOSTASIS N/A 08/12/2018   Procedure: HOT HEMOSTASIS (ARGON PLASMA COAGULATION/BICAP);  Surgeon: Ladene Artist, MD;  Location: [facility];  Service: Endoscopy;  Laterality: N/A;  EGD and Colon APC   NO PAST SURGERIES     POLYPECTOMY      SocHx  reports that she quit smoking about 23 years ago. She has never used smokeless tobacco. She reports that she does not drink alcohol and does not use drugs.  Allergies  Allergen Reactions   Amoxicillin Diarrhea    DID THE REACTION INVOLVE: Swelling of the face/tongue/throat, SOB, or low BP? N Sudden or severe rash/hives, skin peeling, or the inside of the mouth or nose? N Did it require medical treatment? N When did it last happen?MORE THAN 10 YEARS If all above answers are NO, may proceed with cephalosporin use.    Celebrex [Celecoxib]     edema   Enalapril Cough   Lipitor [Atorvastatin]     Muscle aches   Amlodipine Besylate     REACTION: edema   Codeine Sulfate     REACTION: Nausea   Hydrocodone-Acetaminophen     REACTION: Nausea    FamHx Family History  Problem Relation Age of Onset   Stroke Mother    Diabetes Mother    Kidney cancer Mother    Stomach cancer Mother    Heart disease Mother    Stroke Father    Heart disease Brother    Heart disease Brother    Crohn's disease Brother    Heart disease Sister        CATH,STENT   Clotting disorder Sister    Cervical cancer Sister    Lung cancer Sister    Heart attack Sister    Diabetes Sister    Colon cancer Neg Hx    Esophageal cancer Neg Hx    Rectal cancer Neg Hx     Prior to Admission medications   Medication Sig Start Date End Date Taking? Authorizing Provider  acetaminophen (TYLENOL) 325 MG tablet Take 325-650 mg by mouth daily as needed for moderate pain, fever or headache.    Yes [provider]  albuterol (PROAIR HFA) 108 (90 Base) MCG/ACT inhaler Inhale 1-2 puffs into the lungs every 6  (six) hours as needed for wheezing or shortness of breath. 10/30/16  Yes [name], MD  allopurinol (ZYLOPRIM) 100 MG tablet TAKE 1 TABLET BY MOUTH EVERY DAY FOR GOUT Patient taking differently: Take 100 mg by mouth daily.  12/02/19  Yes [name], MD  BIDIL 20-37.5 MG tablet TAKE 1 TABLET BY MOUTH THREE TIMES DAILY Patient taking differently: Take 1 tablet by mouth 3 (three) times daily.  11/11/19  Yes [name], MD  ferrous sulfate 325 (65 FE) MG tablet Take 1 tablet (325 mg total) by mouth 2 (two) times daily with a meal. 11/13/19  Yes [name], MD  metoprolol tartrate (LOPRESSOR) 25 MG tablet Take 25 mg by mouth  daily. 03/03/20  Yes [provider]  Pitavastatin Calcium 1 MG TABS Take 1 tablet (1 mg total) by mouth daily. 02/05/20  Yes [name], MD  torsemide (DEMADEX) 20 MG tablet TAKE 1 TABLET(20 MG) BY MOUTH TWICE DAILY Patient taking differently: Take 20 mg by mouth 2 (two) times daily.  04/25/20  Yes [name], MD  allopurinol (ZYLOPRIM) 100 MG tablet One po QD 06/09/19   [name], MD  fluticasone furoate-vilanterol (BREO ELLIPTA) 100-25 MCG/INH AEPB INHALE 1 PUFF INTO THE LUNGS DAILY 04/23/19   [name], MD  torsemide (DEMADEX) 20 MG tablet TAKE 1 TABLET(20 MG) BY MOUTH TWICE DAILY 08/03/19   [name], MD  torsemide (DEMADEX) 20 MG tablet TAKE 1 TABLET(20 MG) BY MOUTH TWICE DAILY 10/25/19   [name], MD    Physical Exam: Vitals:   05/07/20 1044 05/07/20 1103 05/07/20 1140 05/07/20 1228  BP: (!) 174/47 (!) 137/44 (!) 145/45 (!) 130/54  Pulse: 71 62 65 61  Resp: 16 17 20 13   Temp: 98.3 F (36.8 C)     TempSrc: Oral     SpO2: 100% 100% 100% 100%  Weight:      Height:        General: 79 y.o. female resting in bed in NAD Eyes: PERRL, normal sclera ENMT: Nares patent w/o discharge, orophaynx clear, dentition normal, ears w/o discharge/lesions/ulcers Neck: Supple, trachea midline Cardiovascular: RRR, +S1, S2, no m/g/r,  equal pulses throughout Respiratory: CTABL, no w/r/r, normal WOB, on baseline 2L [place] GI: BS+, NDNT, no masses noted, no organomegaly noted MSK: No c/c, chronic BLE edema Skin: No rashes, bruises, ulcerations noted Neuro: A&O x 3, no focal deficits Psyc: Appropriate interaction and affect, calm/cooperative  Labs on Admission: I have personally reviewed following labs and imaging studies  CBC: Recent Labs  Lab 05/07/20 1036  WBC 10.2  HGB 6.3*  HCT 20.6*  MCV 88.0  PLT 588   Basic Metabolic Panel: Recent Labs  Lab 05/07/20 1036  NA 137  K 3.6  CL 103  CO2 26  GLUCOSE 140*  BUN 39*  CREATININE 1.55*  CALCIUM 8.7*   GFR: Estimated Creatinine Clearance: 29.2 mL/min (A) (by C-G formula based on SCr of 1.55 mg/dL (H)). Liver Function Tests: Recent Labs  Lab 05/07/20 1036  AST 13*  ALT 10  ALKPHOS 51  BILITOT 0.4  PROT 6.6  ALBUMIN 3.4*   No results for input(s): LIPASE, AMYLASE in the last 168 hours. No results for input(s): AMMONIA in the last 168 hours. Coagulation Profile: No results for input(s): INR, PROTIME in the last 168 hours. Cardiac Enzymes: No results for input(s): CKTOTAL, CKMB, CKMBINDEX, TROPONINI in the last 168 hours. BNP (last 3 results) No results for input(s): PROBNP in the last 8760 hours. HbA1C: No results for input(s): HGBA1C in the last 72 hours. CBG: No results for input(s): GLUCAP in the last 168 hours. Lipid Profile: No results for input(s): CHOL, HDL, LDLCALC, TRIG, CHOLHDL, LDLDIRECT in the last 72 hours. Thyroid Function Tests: No results for input(s): TSH, T4TOTAL, FREET4, T3FREE, THYROIDAB in the last 72 hours. Anemia Panel: No results for input(s): VITAMINB12, FOLATE, FERRITIN, TIBC, IRON, RETICCTPCT in the last 72 hours. Urine analysis:    Component Value Date/Time   COLORURINE YELLOW 11/05/2017 1103   APPEARANCEUR CLEAR 11/05/2017 1103   LABSPEC <=1.005 (A) 11/05/2017 1103   PHURINE 6.5 11/05/2017 1103   GLUCOSEU  NEGATIVE 11/05/2017 1103   HGBUR NEGATIVE 11/05/2017  1103   HGBUR moderate 02/02/2008 1439   BILIRUBINUR NEGATIVE 11/05/2017 1103   KETONESUR NEGATIVE 11/05/2017 1103   PROTEINUR 100 (A) 06/01/2013 1722   UROBILINOGEN 0.2 11/05/2017 1103   NITRITE NEGATIVE 11/05/2017 1103   LEUKOCYTESUR NEGATIVE 11/05/2017 1103    Radiological Exams on Admission: No results found.   Assessment/Plan GIB Iron deficiency anemia     - admit to obs, med-surg     - she has had rectal bleeding for last week; both BRBPR and dark, sticky stools; increased amount over last 36 hours     - Hgb is 6.3 this AM. She was 9.1 at beginning of July and 10 in April     - getting 2 units pRBCs     - GI consulted; appreciate assistance     - q6h H&H, protonix  Gout     - continue allopurinol  HTN     - continue Bidil  Chronic combined systolic/diastolic HF     - continue bidil     - chart review shows she was on metoprolol for this and pAfib, but it was stopped d/t bradycardia  pAfib     - per cardiology office note, no longer on metoprolol (d/t bradycardia) or anticoag (d/t recurrent GIB)  HLD     - continue statin  Chronic hypoxic respiratory failure     - on baseline 2L [place] use, follow  CKD3b     - baseline Scr is 1.2 - 1.4. She is 1.55 at admission; mildly up     - watch nephrotoxins, follow labs  DVT prophylaxis: SCDs  Code Status: FULL  Family Communication: None at bedside.   Consults called: GI  Admission status: Observation  Status is: Observation  The patient remains OBS appropriate and will d/c before 2 midnights.  Dispo: The patient is from: Home              Anticipated d/c is to: Home              Anticipated d/c date is: 1 day              Patient currently is not medically stable to d/c.  Jonnie Finner DO [facility]  If 7PM-7AM, please contact night-coverage [email or web address]  05/07/2020, 12:38 PM

## 2020-05-07 NOTE — H&P (View-Only) (Signed)
Consultation  Referring Provider: Dr. Marylyn Ishihara      Primary Care Physician:  [name], MD Primary Gastroenterologist: Dr. Fuller Plan        Reason for Consultation: GI bleed            HPI:   [name] is a 79 y.o. female with a past medical history as listed below including angiodysplasia of the duodenum and cecum, COPD on O2, CKD stage III and CHF, who presented to the ER today with a complaint of fatigue and weakness with blood in her stool.    Today, the patient describes that she chronically has some GI bleeding of bright red blood off-and-on, but it typically stops on its own, the last time that she came in for this to the hospital was in July but she ended up leaving AMA because she was not being helped.  At that point her bleeding stopped on its own.  Most recently she started with bleeding again on Friday, 04/29/2020 and had about 3 bowel movements with bright red blood, she did not have any over the weekend but it started back again on Monday with multiple bowel movements a day 2 or 3 until yesterday when she started to pass even more blood and this morning had 2 bowel movements between midnight and 8 AM, the last was just bright red blood.  She is also having some left-sided abdominal discomfort which is now radiating over to the right side, this is some better after she passes a stool but never completely goes away.  Describes that her stool has been mostly dark blood in the commode as well as some bright red blood.  Tells me her PCP typically has her on iron infusions every 3 weeks which has been keeping her away from the hospital.  Associated symptoms include a near syncopal episode and some increasing weakness over the past 5 days.    Denies fever, chills, weight loss, heartburn, reflux, nausea or vomiting.  ER course: FOBT positive, hemoglobin of 6.3 (baseline around 9/10)  GI history: 08/12/2018 colonoscopy: - Stenosis in the transverse colon at the periumbilical hernia. -  One 7 mm polyp in the transverse colon. Not removed. - A single bleeding transverse colon angiodysplastic lesion. Treated with argon plasma coagulation (APC). - The examination was otherwise normal on direct and retroflexion views. - Diverticulosis in the left colon. - No specimens collected. 08/12/2018 EGD: - Normal esophagus. - A single non-bleeding angiodysplastic lesion in the stomach. Treated with argon plasma coagulation (APC). - A single non-bleeding angiodysplastic lesion in the duodenum. Treated with argon plasma coagulation (APC). - Normal second portion of the duodenum and third portion of the duodenum. - No specimens collected. 08/04/2018 patient consulted by our service for hematochezia and anemia: At that time hemoglobin was 7.7 and she reported multiple bloody bowel movements; at time of discharge it was noted that she has chronic GI blood loss from cecal AVMs which is managed by her PCP with p.o. iron and IV iron with transfusions as necessary, it was noted that due to her hernia we were unable to perform colonoscopy proximal to the transverse colon, so no endoscopic therapy is available to her 02/23/2015 colonoscopy to evaluate unexplained bleeding: Findings of 4 angiodysplastic lesions at the cecum, sessile polyp in the ascending colon, mild diverticulosis in the sigmoid colon and grade 1 internal hemorrhoids; diagnosed with chronic blood loss from AVMs recommended long-term iron replacement and monitor CBCs per PCP 02/23/2015 EGD:  AVM in the second part of the duodenum, hiatal hernia  Past Medical History:  Diagnosis Date  . Allergy   . Angiodysplasia of cecum   . Angiodysplasia of duodenum   . Arthritis   . Asteatotic eczema 02/25/2016  . Asthma   . Atrial fibrillation ([place]) 10/17/2017  . Blood transfusion without reported diagnosis   . Cataract   . Chest pain at rest 03/22/2010   Qualifier: Diagnosis of  By: Ronnald Ramp MD, Arvid Right.   . CHF (congestive heart failure) ([place])  08/31/03  . CHF (congestive heart failure), NYHA class III, chronic, combined ([place]) 08/10/2017  . Chronic venous stasis dermatitis of both lower extremities 06/02/2013  . CKD (chronic kidney disease), stage III ([place]) 06/20/2010   Estimated Creatinine Clearance: 34.8 mL/min (A) (by C-G formula based on SCr of 1.37 mg/dL (H)).   Marland Kitchen COLD (chronic obstructive lung disease) ([place])   . Colonic polyp 02/16/2008   Tubular adenoma  . COPD (chronic obstructive pulmonary disease) ([place]) 01/27/98  . Cor pulmonale ([place]) 11/16/2014  . D-dimer, elevated 01/01/2018  . GERD 07/30/1992  . GERD (gastroesophageal reflux disease) 07/30/92  . Gout   . Heart murmur   . HTN (hypertension) 07/30/1988  . Hyperlipidemia 11/27/96  . Hyperlipidemia with target LDL less than 100 11/27/1996       . Kidney stone 1960, 1972, 1991  . MGUS (monoclonal gammopathy of unknown significance) 11/08/2016  . Morbid obesity ([place]) 04/19/2010   She agrees to work on her lifestyle modifications to help her lose weight.   . Neck pain on right side 01/01/2018  . OSA (obstructive sleep apnea) 06/22/2013  . Osteopenia, senile 02/05/2013   July 2014  -2.1 left femur -1.9 left forearm   . Other screening mammogram 02/05/2013  . Oxygen deficiency   . Prediabetes 11/13/2006  . Renal insufficiency   . Stenosis of cervical spine with myelopathy ([place]) 01/01/2018  . Ulcer of leg, chronic, right ([place]) 10/10/2011  . Ulcerative colitis   . ULCERATIVE COLITIS-LEFT SIDE 03/19/2008       . Vitamin B12 deficiency anemia 11/13/2006       . Vitamin D deficiency 02/06/2013    Past Surgical History:  Procedure Laterality Date  . BRONCHOSCOPY    . COLONOSCOPY    . COLONOSCOPY WITH PROPOFOL N/A 02/23/2015   Procedure: COLONOSCOPY WITH PROPOFOL;  Surgeon: Ladene Artist, MD;  Location: [facility];  Service: Endoscopy;  Laterality: N/A;  . COLONOSCOPY WITH PROPOFOL N/A 08/12/2018   Procedure: COLONOSCOPY WITH PROPOFOL;  Surgeon: Ladene Artist, MD;  Location: [facility];   Service: Endoscopy;  Laterality: N/A;  . ESOPHAGOGASTRODUODENOSCOPY (EGD) WITH PROPOFOL N/A 02/23/2015   Procedure: ESOPHAGOGASTRODUODENOSCOPY (EGD) WITH PROPOFOL;  Surgeon: Ladene Artist, MD;  Location: [facility];  Service: Endoscopy;  Laterality: N/A;  . ESOPHAGOGASTRODUODENOSCOPY (EGD) WITH PROPOFOL N/A 08/12/2018   Procedure: ESOPHAGOGASTRODUODENOSCOPY (EGD) WITH PROPOFOL;  Surgeon: Ladene Artist, MD;  Location: [facility];  Service: Endoscopy;  Laterality: N/A;  . HOT HEMOSTASIS N/A 08/12/2018   Procedure: HOT HEMOSTASIS (ARGON PLASMA COAGULATION/BICAP);  Surgeon: Ladene Artist, MD;  Location: [facility];  Service: Endoscopy;  Laterality: N/A;  EGD and Colon APC  . NO PAST SURGERIES    . POLYPECTOMY      Family History  Problem Relation Age of Onset  . Stroke Mother   . Diabetes Mother   . Kidney cancer Mother   . Stomach cancer Mother   . Heart disease Mother   . Stroke  Father   . Heart disease Brother   . Heart disease Brother   . Crohn's disease Brother   . Heart disease Sister        CATH,STENT  . Clotting disorder Sister   . Cervical cancer Sister   . Lung cancer Sister   . Heart attack Sister   . Diabetes Sister   . Colon cancer Neg Hx   . Esophageal cancer Neg Hx   . Rectal cancer Neg Hx      Social History   Tobacco Use  . Smoking status: Former Smoker    Quit date: 06/01/1996    Years since quitting: 23.9  . Smokeless tobacco: Never Used  . Tobacco comment: Quit 1998  Vaping Use  . Vaping Use: Never used  Substance Use Topics  . Alcohol use: No  . Drug use: No    Prior to Admission medications   Medication Sig Start Date End Date Taking? Authorizing Provider  acetaminophen (TYLENOL) 325 MG tablet Take 325-650 mg by mouth daily as needed for moderate pain, fever or headache.    Yes [provider]  albuterol (PROAIR HFA) 108 (90 Base) MCG/ACT inhaler Inhale 1-2 puffs into the lungs every 6 (six) hours as needed for wheezing or  shortness of breath. 10/30/16  Yes [name], MD  allopurinol (ZYLOPRIM) 100 MG tablet TAKE 1 TABLET BY MOUTH EVERY DAY FOR GOUT Patient taking differently: Take 100 mg by mouth daily.  12/02/19  Yes [name], MD  BIDIL 20-37.5 MG tablet TAKE 1 TABLET BY MOUTH THREE TIMES DAILY Patient taking differently: Take 1 tablet by mouth 3 (three) times daily.  11/11/19  Yes [name], MD  ferrous sulfate 325 (65 FE) MG tablet Take 1 tablet (325 mg total) by mouth 2 (two) times daily with a meal. 11/13/19  Yes [name], MD  metoprolol tartrate (LOPRESSOR) 25 MG tablet Take 25 mg by mouth daily. 03/03/20  Yes [provider]  Pitavastatin Calcium 1 MG TABS Take 1 tablet (1 mg total) by mouth daily. 02/05/20  Yes [name], MD  torsemide (DEMADEX) 20 MG tablet TAKE 1 TABLET(20 MG) BY MOUTH TWICE DAILY Patient taking differently: Take 20 mg by mouth 2 (two) times daily.  04/25/20  Yes [name], MD  allopurinol (ZYLOPRIM) 100 MG tablet One po QD 06/09/19   [name], MD  fluticasone furoate-vilanterol (BREO ELLIPTA) 100-25 MCG/INH AEPB INHALE 1 PUFF INTO THE LUNGS DAILY 04/23/19   [name], MD  torsemide (DEMADEX) 20 MG tablet TAKE 1 TABLET(20 MG) BY MOUTH TWICE DAILY 08/03/19   [name], MD  torsemide (DEMADEX) 20 MG tablet TAKE 1 TABLET(20 MG) BY MOUTH TWICE DAILY 10/25/19   [name], MD    No current facility-administered medications for this encounter.   Current Outpatient Medications  Medication Sig Dispense Refill  . acetaminophen (TYLENOL) 325 MG tablet Take 325-650 mg by mouth daily as needed for moderate pain, fever or headache.     . albuterol (PROAIR HFA) 108 (90 Base) MCG/ACT inhaler Inhale 1-2 puffs into the lungs every 6 (six) hours as needed for wheezing or shortness of breath. 18 g 11  . allopurinol (ZYLOPRIM) 100 MG tablet TAKE 1 TABLET BY MOUTH EVERY DAY FOR GOUT (Patient taking differently: Take 100 mg by mouth daily. ) 90 tablet  1  . BIDIL 20-37.5 MG tablet TAKE 1 TABLET BY MOUTH THREE TIMES DAILY (Patient taking differently: Take  1 tablet by mouth 3 (three) times daily. ) 270 tablet 0  . ferrous sulfate 325 (65 FE) MG tablet Take 1 tablet (325 mg total) by mouth 2 (two) times daily with a meal. 180 tablet 1  . metoprolol tartrate (LOPRESSOR) 25 MG tablet Take 25 mg by mouth daily.    . Pitavastatin Calcium 1 MG TABS Take 1 tablet (1 mg total) by mouth daily. 90 tablet 1  . torsemide (DEMADEX) 20 MG tablet TAKE 1 TABLET(20 MG) BY MOUTH TWICE DAILY (Patient taking differently: Take 20 mg by mouth 2 (two) times daily. ) 180 tablet 0    Allergies as of 05/07/2020 - Review Complete 05/07/2020  Allergen Reaction Noted  . Amoxicillin Diarrhea   . Celebrex [celecoxib]  10/10/2011  . Enalapril Cough 02/19/2011  . Lipitor [atorvastatin]  04/14/2014  . Amlodipine besylate  05/25/2010  . Codeine sulfate    . Hydrocodone-acetaminophen       Review of Systems:    Constitutional: No weight loss, fever or chills Skin: No rash  Cardiovascular: No chest pain Respiratory: No SOB  Gastrointestinal: See HPI and otherwise negative Genitourinary: No dysuria  Neurological: No headache Musculoskeletal: No new muscle or joint pain Hematologic: No bleeding  Psychiatric: No history of depression or anxiety    Physical Exam:  Vital signs in last 24 hours: Temp:  [98 F (36.7 C)-98.3 F (36.8 C)] 98.3 F (36.8 C) (10/09 1044) Pulse Rate:  [62-71] 65 (10/09 1140) Resp:  [16-20] 20 (10/09 1140) BP: (137-174)/(44-48) 145/45 (10/09 1140) SpO2:  [95 %-100 %] 100 % (10/09 1140) Weight:  [88.9 kg] 88.9 kg (10/09 1034)   General:   Pleasant overweight [place] female appears to be in NAD, Well developed, Well nourished, alert and cooperative Head:  Normocephalic and atraumatic. Eyes:   PEERL, EOMI. No icterus. Conjunctiva pink. Ears:  Normal auditory acuity. Neck:  Supple Throat: Oral cavity and pharynx without inflammation,  swelling or lesion.  Lungs: Respirations even and unlabored. Lungs clear to auscultation bilaterally.   No wheezes, crackles, or rhonchi.  Heart: Normal S1, S2. No MRG. Regular rate and rhythm. No peripheral edema, cyanosis or pallor.  Abdomen:  Soft, nondistended, mild RLQ and LLQ ttp. No rebound or guarding. Normal bowel sounds. No appreciable masses or hepatomegaly. Rectal:  Not performed.  Msk:  Symmetrical without gross deformities. Peripheral pulses intact.  Extremities:  Without edema, no deformity or joint abnormality.  Neurologic:  Alert and  oriented x4;  grossly normal neurologically.  Skin:   Dry and intact without significant lesions or rashes. Psychiatric: Demonstrates good judgement and reason without abnormal affect or behaviors.   LAB RESULTS: Recent Labs    05/07/20 1036  WBC 10.2  HGB 6.3*  HCT 20.6*  PLT 251   BMET Recent Labs    05/07/20 1036  NA 137  K 3.6  CL 103  CO2 26  GLUCOSE 140*  BUN 39*  CREATININE 1.55*  CALCIUM 8.7*   LFT Recent Labs    05/07/20 1036  PROT 6.6  ALBUMIN 3.4*  AST 13*  ALT 10  ALKPHOS 51  BILITOT 0.4     Impression / Plan:   Impression: 1.  GI bleed: History of GI bleeds from AVMs in stomach, duodenum and cecum, last APC in January 2020 (inability to go past the transverse colon at that time due to abdominal hernia), typically managed with p.o. iron and IV iron infusions as well as transfusions as needed by her PCP, she tells  me the last blood transfusion she has required was in January, now 5 days of bleeding; most likely from known AVMs 2.  History of AVMs 3.  Anemia: Related to #1, hemoglobin 6.3 (baseline around 9/10), 2 units PRBCs ordered 4.  Chronic combined systolic/diastolic HF 5.  Paroxysmal A. fib: No anticoagulation due to recurrent GI bleeds 6.  Chronic hypoxic respiratory failure: Baseline O2 at 2 L via nasal cannula 7.  CKD stage III  Plan: 1.  Continue to monitor hemoglobin with transfusion as  needed less than 7 2.  Scheduled patient for an EGD tomorrow with Dr. Havery Moros.  Did discuss risks, benefits, limitations and alternatives and the patient agrees to proceed. 3. Continue Pantoprazole 101m IV BID 4.  Patient can be on clear liquids today and n.p.o. after midnight 5.  Please await any further recommendations from Dr. AHavery Moroslater this afternoon.  Thank you for your kind consultation, we will continue to follow.  JLavone NianLemmon  05/07/2020, 12:08 PM

## 2020-05-07 NOTE — ED Provider Notes (Signed)
[facility] DEPT Provider Note   CSN: [id number] Arrival date & time: 05/07/20  1028     History Chief Complaint  Patient presents with  . GI Bleeding    [name] is a 79 y.o. female.  79 year old female presents with complaint of fatigue and weakness with blood in the commode today. Patient has a history of anemia and GI bleed, last iron transfusion was in July 2021, sees [facility]. Reports left side abdominal pain without nausea, vomiting, chest pain, shortness of breath, changes in bladder habits. Patient has had blood in her stools for the past week, came to the ER today because she was concerned about the quantity of blood in the commode. States all week her stools have looked dark with brighter blood in the commode, today only passed bright red blood. No other complaints or concerns.         Past Medical History:  Diagnosis Date  . Allergy   . Angiodysplasia of cecum   . Angiodysplasia of duodenum   . Arthritis   . Asteatotic eczema 02/25/2016  . Asthma   . Atrial fibrillation ([place]) 10/17/2017  . Blood transfusion without reported diagnosis   . Cataract   . Chest pain at rest 03/22/2010   Qualifier: Diagnosis of  By: Ronnald Ramp MD, Arvid Right.   . CHF (congestive heart failure) ([place]) 08/31/03  . CHF (congestive heart failure), NYHA class III, chronic, combined ([place]) 08/10/2017  . Chronic venous stasis dermatitis of both lower extremities 06/02/2013  . CKD (chronic kidney disease), stage III ([place]) 06/20/2010   Estimated Creatinine Clearance: 34.8 mL/min (A) (by C-G formula based on SCr of 1.37 mg/dL (H)).   Marland Kitchen COLD (chronic obstructive lung disease) ([place])   . Colonic polyp 02/16/2008   Tubular adenoma  . COPD (chronic obstructive pulmonary disease) ([place]) 01/27/98  . Cor pulmonale ([place]) 11/16/2014  . D-dimer, elevated 01/01/2018  . GERD 07/30/1992  . GERD (gastroesophageal reflux disease) 07/30/92  . Gout   . Heart murmur   . HTN (hypertension)  07/30/1988  . Hyperlipidemia 11/27/96  . Hyperlipidemia with target LDL less than 100 11/27/1996       . Kidney stone 1960, 1972, 1991  . MGUS (monoclonal gammopathy of unknown significance) 11/08/2016  . Morbid obesity ([place]) 04/19/2010   She agrees to work on her lifestyle modifications to help her lose weight.   . Neck pain on right side 01/01/2018  . OSA (obstructive sleep apnea) 06/22/2013  . Osteopenia, senile 02/05/2013   July 2014  -2.1 left femur -1.9 left forearm   . Other screening mammogram 02/05/2013  . Oxygen deficiency   . Prediabetes 11/13/2006  . Renal insufficiency   . Stenosis of cervical spine with myelopathy ([place]) 01/01/2018  . Ulcer of leg, chronic, right ([place]) 10/10/2011  . Ulcerative colitis   . ULCERATIVE COLITIS-LEFT SIDE 03/19/2008       . Vitamin B12 deficiency anemia 11/13/2006       . Vitamin D deficiency 02/06/2013    Patient Active Problem List   Diagnosis Date Noted  . GIB (gastrointestinal bleeding) 05/07/2020  . Left lower quadrant abdominal tenderness without rebound tenderness 02/06/2020  . Chronic respiratory failure with hypoxia ([place]) 02/06/2020  . Routine general medical examination at a health care facility 02/06/2020  . Umbilical hernia with obstruction 08/27/2018  . AVM (arteriovenous malformation) of colon 08/14/2018  . Hernia, ventral 08/12/2018  . Multiple lung nodules on CT 05/05/2018  . Stenosis of  cervical spine with myelopathy ([place]) 01/01/2018  . Atrial fibrillation ([place]) 10/17/2017  . CHF (congestive heart failure), NYHA class III, chronic, diastolic ([place]) 38/75/6433  . Angiodysplasia of cecum   . Angiodysplasia of duodenum   . Grade III diastolic dysfunction 29/51/8841  . MGUS (monoclonal gammopathy of unknown significance) 11/08/2016  . Asteatotic eczema 02/25/2016  . COLD (chronic obstructive lung disease) ([place])   . Anemia, iron deficiency 11/16/2014  . Cor pulmonale ([place]) 11/16/2014  . OSA (obstructive sleep apnea) 06/22/2013  .  Chronic venous stasis dermatitis of both lower extremities 06/02/2013  . Vitamin D deficiency 02/06/2013  . Osteopenia, senile 02/05/2013  . Other screening mammogram 02/05/2013  . CKD (chronic kidney disease), stage III ([place]) 06/20/2010  . Morbid obesity ([place]) 04/19/2010  . Gout 01/10/2009  . ULCERATIVE COLITIS-LEFT SIDE 03/19/2008  . Vitamin B12 deficiency anemia 11/13/2006  . Hyperlipidemia with target LDL less than 100 11/27/1996  . GERD 07/30/1992  . HTN (hypertension) 07/30/1988    Past Surgical History:  Procedure Laterality Date  . BRONCHOSCOPY    . COLONOSCOPY    . COLONOSCOPY WITH PROPOFOL N/A 02/23/2015   Procedure: COLONOSCOPY WITH PROPOFOL;  Surgeon: Ladene Artist, MD;  Location: [facility] ENDOSCOPY;  Service: Endoscopy;  Laterality: N/A;  . COLONOSCOPY WITH PROPOFOL N/A 08/12/2018   Procedure: COLONOSCOPY WITH PROPOFOL;  Surgeon: Ladene Artist, MD;  Location: [facility];  Service: Endoscopy;  Laterality: N/A;  . ESOPHAGOGASTRODUODENOSCOPY (EGD) WITH PROPOFOL N/A 02/23/2015   Procedure: ESOPHAGOGASTRODUODENOSCOPY (EGD) WITH PROPOFOL;  Surgeon: Ladene Artist, MD;  Location: [facility];  Service: Endoscopy;  Laterality: N/A;  . ESOPHAGOGASTRODUODENOSCOPY (EGD) WITH PROPOFOL N/A 08/12/2018   Procedure: ESOPHAGOGASTRODUODENOSCOPY (EGD) WITH PROPOFOL;  Surgeon: Ladene Artist, MD;  Location: [facility];  Service: Endoscopy;  Laterality: N/A;  . HOT HEMOSTASIS N/A 08/12/2018   Procedure: HOT HEMOSTASIS (ARGON PLASMA COAGULATION/BICAP);  Surgeon: Ladene Artist, MD;  Location: [facility];  Service: Endoscopy;  Laterality: N/A;  EGD and Colon APC  . NO PAST SURGERIES    . POLYPECTOMY       OB History   No obstetric history on file.     Family History  Problem Relation Age of Onset  . Stroke Mother   . Diabetes Mother   . Kidney cancer Mother   . Stomach cancer Mother   . Heart disease Mother   . Stroke Father   . Heart disease Brother   . Heart disease  Brother   . Crohn's disease Brother   . Heart disease Sister        CATH,STENT  . Clotting disorder Sister   . Cervical cancer Sister   . Lung cancer Sister   . Heart attack Sister   . Diabetes Sister   . Colon cancer Neg Hx   . Esophageal cancer Neg Hx   . Rectal cancer Neg Hx     Social History   Tobacco Use  . Smoking status: Former Smoker    Quit date: 06/01/1996    Years since quitting: 23.9  . Smokeless tobacco: Never Used  . Tobacco comment: Quit 1998  Vaping Use  . Vaping Use: Never used  Substance Use Topics  . Alcohol use: No  . Drug use: No    Home Medications Prior to Admission medications   Medication Sig Start Date End Date Taking? Authorizing Provider  acetaminophen (TYLENOL) 325 MG tablet Take 325-650 mg by mouth daily as needed for moderate pain, fever or headache.  Yes [provider]  albuterol (PROAIR HFA) 108 (90 Base) MCG/ACT inhaler Inhale 1-2 puffs into the lungs every 6 (six) hours as needed for wheezing or shortness of breath. 10/30/16  Yes [name], MD  allopurinol (ZYLOPRIM) 100 MG tablet TAKE 1 TABLET BY MOUTH EVERY DAY FOR GOUT Patient taking differently: Take 100 mg by mouth daily.  12/02/19  Yes [name], MD  BIDIL 20-37.5 MG tablet TAKE 1 TABLET BY MOUTH THREE TIMES DAILY Patient taking differently: Take 1 tablet by mouth 3 (three) times daily.  11/11/19  Yes [name], MD  ferrous sulfate 325 (65 FE) MG tablet Take 1 tablet (325 mg total) by mouth 2 (two) times daily with a meal. 11/13/19  Yes [name], MD  metoprolol tartrate (LOPRESSOR) 25 MG tablet Take 25 mg by mouth daily. 03/03/20  Yes [provider]  Pitavastatin Calcium 1 MG TABS Take 1 tablet (1 mg total) by mouth daily. 02/05/20  Yes [name], MD  torsemide (DEMADEX) 20 MG tablet TAKE 1 TABLET(20 MG) BY MOUTH TWICE DAILY Patient taking differently: Take 20 mg by mouth 2 (two) times daily.  04/25/20  Yes [name], MD  allopurinol  (ZYLOPRIM) 100 MG tablet One po QD 06/09/19   [name], MD  fluticasone furoate-vilanterol (BREO ELLIPTA) 100-25 MCG/INH AEPB INHALE 1 PUFF INTO THE LUNGS DAILY 04/23/19   [name], MD  torsemide (DEMADEX) 20 MG tablet TAKE 1 TABLET(20 MG) BY MOUTH TWICE DAILY 08/03/19   [name], MD  torsemide (DEMADEX) 20 MG tablet TAKE 1 TABLET(20 MG) BY MOUTH TWICE DAILY 10/25/19   [name], MD    Allergies    Amoxicillin, Celebrex [celecoxib], Enalapril, Lipitor [atorvastatin], Amlodipine besylate, Codeine sulfate, and Hydrocodone-acetaminophen  Review of Systems   Review of Systems  Constitutional: Positive for fatigue. Negative for fever.  Respiratory: Negative for shortness of breath.   Cardiovascular: Negative for chest pain.  Gastrointestinal: Positive for abdominal pain and blood in stool. Negative for constipation, diarrhea, nausea and vomiting.  Genitourinary: Negative for dysuria.  Skin: Negative for rash and wound.  Allergic/Immunologic: Positive for immunocompromised state.  Neurological: Positive for weakness.  All other systems reviewed and are negative.   Physical Exam Updated Vital Signs BP (!) 145/45   Pulse 65   Temp 98.3 F (36.8 C) (Oral)   Resp 20   Ht 5' (1.524 m)   Wt 88.9 kg   SpO2 100%   BMI 38.28 kg/m   Physical Exam Vitals and nursing note reviewed.  Constitutional:      General: She is not in acute distress.    Appearance: She is well-developed. She is not diaphoretic.  HENT:     Head: Normocephalic and atraumatic.  Cardiovascular:     Rate and Rhythm: Normal rate and regular rhythm.     Pulses: Normal pulses.     Heart sounds: Normal heart sounds.  Pulmonary:     Effort: Pulmonary effort is normal.     Breath sounds: Normal breath sounds.  Abdominal:     Palpations: Abdomen is soft.     Tenderness: There is abdominal tenderness.     Hernia: A hernia is present. Hernia is present in the umbilical area.     Comments: Large  umbilical hernia, unchanged per patient.  Musculoskeletal:     Right lower leg: No edema.     Left lower leg: No edema.  Skin:    General:  Skin is warm and dry.     Findings: No erythema or rash.  Neurological:     Mental Status: She is alert and oriented to person, place, and time.  Psychiatric:        Behavior: Behavior normal.     ED Results / Procedures / Treatments   Labs (all labs ordered are listed, but only abnormal results are displayed) Labs Reviewed  COMPREHENSIVE METABOLIC PANEL - Abnormal; Notable for the following components:      Result Value   Glucose, Bld 140 (*)    BUN 39 (*)    Creatinine, Ser 1.55 (*)    Calcium 8.7 (*)    Albumin 3.4 (*)    AST 13 (*)    GFR, Estimated 32 (*)    All other components within normal limits  CBC - Abnormal; Notable for the following components:   RBC 2.34 (*)    Hemoglobin 6.3 (*)    HCT 20.6 (*)    All other components within normal limits  RESPIRATORY PANEL BY RT PCR (FLU A&B, COVID)  POC OCCULT BLOOD, ED  TYPE AND SCREEN  PREPARE RBC (CROSSMATCH)    EKG None  Radiology No results found.  Procedures .Critical Care Performed by: Tacy Learn, PA-C Authorized by: Tacy Learn, PA-C   Critical care provider statement:    Critical care time (minutes):  45   Critical care was time spent personally by me on the following activities:  Discussions with consultants, evaluation of patient's response to treatment, examination of patient, ordering and performing treatments and interventions, ordering and review of laboratory studies, ordering and review of radiographic studies, pulse oximetry, re-evaluation of patient's condition, obtaining history from patient or surrogate and review of old charts   (including critical care time)  Medications Ordered in ED Medications  0.9 %  sodium chloride infusion (10 mL/hr Intravenous New Bag/Given 05/07/20 1132)    ED Course  I have reviewed the triage vital signs and  the nursing notes.  Pertinent labs & imaging results that were available during my care of the patient were reviewed by me and considered in my medical decision making (see chart for details).  Clinical Course as of May 07 1208  Sat May 07, 7054  2631 79 year old female with history of anemia and prior GI bleed with complaint of blood in the commode this week with fatigue/weakness. On exam, obese, mild left side abdominal tenderness with large umbilical hernia (reports these symptoms to be unchanged/baseline for her).  CBC with hgb of 6.3 (baseline seems to be 9-10, last check in July with Hgb 7.6). Vitals stable, plan to admit. Consult with hospitalist who will consult for admission, requests GI consult. Case discussed with [facility] who will consult.    [LM]    Clinical Course User Index [LM] Roque Lias   MDM Rules/Calculators/A&P                          Final Clinical Impression(s) / ED Diagnoses Final diagnoses:  Acute GI bleeding  Symptomatic anemia    Rx / DC Orders ED Discharge Orders    None       Tacy Learn, PA-C 05/07/20 1209    Tegeler, Gwenyth Allegra, MD 05/07/20 1342

## 2020-05-07 NOTE — ED Triage Notes (Addendum)
Rectal bleeding since last Friday 05/04/20 has become worse over last couple of days, Iron infusion every few months, last one July  Pt is o2 dependent 2L [place]

## 2020-05-07 NOTE — Consult Note (Addendum)
Consultation  Referring Provider: Dr. Marylyn Ishihara      Primary Care Physician:  Laurie Lima, MD Primary Gastroenterologist: Dr. Fuller Plan        Reason for Consultation: GI bleed            HPI:   [name] is a 79 y.o. female with a past medical history as listed below including angiodysplasia of the duodenum and cecum, COPD on O2, CKD stage III and CHF, who presented to the ER today with a complaint of fatigue and weakness with blood in her stool.    Today, the patient describes that she chronically has some GI bleeding of bright red blood off-and-on, but it typically stops on its own, the last time that she came in for this to the hospital was in July but she ended up leaving AMA because she was not being helped.  At that point her bleeding stopped on its own.  Most recently she started with bleeding again on Friday, 04/29/2020 and had about 3 bowel movements with bright red blood, she did not have any over the weekend but it started back again on Monday with multiple bowel movements a day 2 or 3 until yesterday when she started to pass even more blood and this morning had 2 bowel movements between midnight and 8 AM, the last was just bright red blood.  She is also having some left-sided abdominal discomfort which is now radiating over to the right side, this is some better after she passes a stool but never completely goes away.  Describes that her stool has been mostly dark blood in the commode as well as some bright red blood.  Tells me her PCP typically has her on iron infusions every 3 weeks which has been keeping her away from the hospital.  Associated symptoms include a near syncopal episode and some increasing weakness over the past 5 days.    Denies fever, chills, weight loss, heartburn, reflux, nausea or vomiting.  ER course: FOBT positive, hemoglobin of 6.3 (baseline around 9/10)  GI history: 08/12/2018 colonoscopy: - Stenosis in the transverse colon at the periumbilical hernia. -  One 7 mm polyp in the transverse colon. Not removed. - A single bleeding transverse colon angiodysplastic lesion. Treated with argon plasma coagulation (APC). - The examination was otherwise normal on direct and retroflexion views. - Diverticulosis in the left colon. - No specimens collected. 08/12/2018 EGD: - Normal esophagus. - A single non-bleeding angiodysplastic lesion in the stomach. Treated with argon plasma coagulation (APC). - A single non-bleeding angiodysplastic lesion in the duodenum. Treated with argon plasma coagulation (APC). - Normal second portion of the duodenum and third portion of the duodenum. - No specimens collected. 08/04/2018 patient consulted by our service for hematochezia and anemia: At that time hemoglobin was 7.7 and she reported multiple bloody bowel movements; at time of discharge it was noted that she has chronic GI blood loss from cecal AVMs which is managed by her PCP with p.o. iron and IV iron with transfusions as necessary, it was noted that due to her hernia we were unable to perform colonoscopy proximal to the transverse colon, so no endoscopic therapy is available to her 02/23/2015 colonoscopy to evaluate unexplained bleeding: Findings of 4 angiodysplastic lesions at the cecum, sessile polyp in the ascending colon, mild diverticulosis in the sigmoid colon and grade 1 internal hemorrhoids; diagnosed with chronic blood loss from AVMs recommended long-term iron replacement and monitor CBCs per PCP 02/23/2015 EGD:  AVM in the second part of the duodenum, hiatal hernia  Past Medical History:  Diagnosis Date   Allergy    Angiodysplasia of cecum    Angiodysplasia of duodenum    Arthritis    Asteatotic eczema 02/25/2016   Asthma    Atrial fibrillation ([place]) 10/17/2017   Blood transfusion without reported diagnosis    Cataract    Chest pain at rest 03/22/2010   Qualifier: Diagnosis of  By: Ronnald Ramp MD, Arvid Right.    CHF (congestive heart failure) ([place])  08/31/03   CHF (congestive heart failure), NYHA class III, chronic, combined ([place]) 08/10/2017   Chronic venous stasis dermatitis of both lower extremities 06/02/2013   CKD (chronic kidney disease), stage III ([place]) 06/20/2010   Estimated Creatinine Clearance: 34.8 mL/min (A) (by C-G formula based on SCr of 1.37 mg/dL (H)).    COLD (chronic obstructive lung disease) ([place])    Colonic polyp 02/16/2008   Tubular adenoma   COPD (chronic obstructive pulmonary disease) ([place]) 01/27/98   Cor pulmonale ([place]) 11/16/2014   D-dimer, elevated 01/01/2018   GERD 07/30/1992   GERD (gastroesophageal reflux disease) 07/30/92   Gout    Heart murmur    HTN (hypertension) 07/30/1988   Hyperlipidemia 11/27/96   Hyperlipidemia with target LDL less than 100 11/27/1996        Kidney stone 1960, 1972, 1991   MGUS (monoclonal gammopathy of unknown significance) 11/08/2016   Morbid obesity ([place]) 04/19/2010   She agrees to work on her lifestyle modifications to help her lose weight.    Neck pain on right side 01/01/2018   OSA (obstructive sleep apnea) 06/22/2013   Osteopenia, senile 02/05/2013   July 2014  -2.1 left femur -1.9 left forearm    Other screening mammogram 02/05/2013   Oxygen deficiency    Prediabetes 11/13/2006   Renal insufficiency    Stenosis of cervical spine with myelopathy ([place]) 01/01/2018   Ulcer of leg, chronic, right ([place]) 10/10/2011   Ulcerative colitis    ULCERATIVE COLITIS-LEFT SIDE 03/19/2008        Vitamin B12 deficiency anemia 11/13/2006        Vitamin D deficiency 02/06/2013    Past Surgical History:  Procedure Laterality Date   BRONCHOSCOPY     COLONOSCOPY     COLONOSCOPY WITH PROPOFOL N/A 02/23/2015   Procedure: COLONOSCOPY WITH PROPOFOL;  Surgeon: Ladene Artist, MD;  Location: [facility];  Service: Endoscopy;  Laterality: N/A;   COLONOSCOPY WITH PROPOFOL N/A 08/12/2018   Procedure: COLONOSCOPY WITH PROPOFOL;  Surgeon: Ladene Artist, MD;  Location: [facility];   Service: Endoscopy;  Laterality: N/A;   ESOPHAGOGASTRODUODENOSCOPY (EGD) WITH PROPOFOL N/A 02/23/2015   Procedure: ESOPHAGOGASTRODUODENOSCOPY (EGD) WITH PROPOFOL;  Surgeon: Ladene Artist, MD;  Location: [facility];  Service: Endoscopy;  Laterality: N/A;   ESOPHAGOGASTRODUODENOSCOPY (EGD) WITH PROPOFOL N/A 08/12/2018   Procedure: ESOPHAGOGASTRODUODENOSCOPY (EGD) WITH PROPOFOL;  Surgeon: Ladene Artist, MD;  Location: [facility];  Service: Endoscopy;  Laterality: N/A;   HOT HEMOSTASIS N/A 08/12/2018   Procedure: HOT HEMOSTASIS (ARGON PLASMA COAGULATION/BICAP);  Surgeon: Ladene Artist, MD;  Location: [facility];  Service: Endoscopy;  Laterality: N/A;  EGD and Colon APC   NO PAST SURGERIES     POLYPECTOMY      Family History  Problem Relation Age of Onset   Stroke Mother    Diabetes Mother    Kidney cancer Mother    Stomach cancer Mother    Heart disease Mother    Stroke  Father    Heart disease Brother    Heart disease Brother    Crohn's disease Brother    Heart disease Sister        CATH,STENT   Clotting disorder Sister    Cervical cancer Sister    Lung cancer Sister    Heart attack Sister    Diabetes Sister    Colon cancer Neg Hx    Esophageal cancer Neg Hx    Rectal cancer Neg Hx      Social History   Tobacco Use   Smoking status: Former Smoker    Quit date: 06/01/1996    Years since quitting: 23.9   Smokeless tobacco: Never Used   Tobacco comment: Quit 1998  Vaping Use   Vaping Use: Never used  Substance Use Topics   Alcohol use: No   Drug use: No    Prior to Admission medications   Medication Sig Start Date End Date Taking? Authorizing Provider  acetaminophen (TYLENOL) 325 MG tablet Take 325-650 mg by mouth daily as needed for moderate pain, fever or headache.    Yes [provider]  albuterol (PROAIR HFA) 108 (90 Base) MCG/ACT inhaler Inhale 1-2 puffs into the lungs every 6 (six) hours as needed for wheezing or  shortness of breath. 10/30/16  Yes Laurie Lima, MD  allopurinol (ZYLOPRIM) 100 MG tablet TAKE 1 TABLET BY MOUTH EVERY DAY FOR GOUT Patient taking differently: Take 100 mg by mouth daily.  12/02/19  Yes Laurie Lima, MD  BIDIL 20-37.5 MG tablet TAKE 1 TABLET BY MOUTH THREE TIMES DAILY Patient taking differently: Take 1 tablet by mouth 3 (three) times daily.  11/11/19  Yes Laurie Lima, MD  ferrous sulfate 325 (65 FE) MG tablet Take 1 tablet (325 mg total) by mouth 2 (two) times daily with a meal. 11/13/19  Yes Laurie Lima, MD  metoprolol tartrate (LOPRESSOR) 25 MG tablet Take 25 mg by mouth daily. 03/03/20  Yes [provider]  Pitavastatin Calcium 1 MG TABS Take 1 tablet (1 mg total) by mouth daily. 02/05/20  Yes Laurie Lima, MD  torsemide (DEMADEX) 20 MG tablet TAKE 1 TABLET(20 MG) BY MOUTH TWICE DAILY Patient taking differently: Take 20 mg by mouth 2 (two) times daily.  04/25/20  Yes Laurie Lima, MD  allopurinol (ZYLOPRIM) 100 MG tablet One po QD 06/09/19   Laurie Lima, MD  fluticasone furoate-vilanterol (BREO ELLIPTA) 100-25 MCG/INH AEPB INHALE 1 PUFF INTO THE LUNGS DAILY 04/23/19   Laurie Lima, MD  torsemide (DEMADEX) 20 MG tablet TAKE 1 TABLET(20 MG) BY MOUTH TWICE DAILY 08/03/19   Laurie Lima, MD  torsemide (DEMADEX) 20 MG tablet TAKE 1 TABLET(20 MG) BY MOUTH TWICE DAILY 10/25/19   Laurie Lima, MD    No current facility-administered medications for this encounter.   Current Outpatient Medications  Medication Sig Dispense Refill   acetaminophen (TYLENOL) 325 MG tablet Take 325-650 mg by mouth daily as needed for moderate pain, fever or headache.      albuterol (PROAIR HFA) 108 (90 Base) MCG/ACT inhaler Inhale 1-2 puffs into the lungs every 6 (six) hours as needed for wheezing or shortness of breath. 18 g 11   allopurinol (ZYLOPRIM) 100 MG tablet TAKE 1 TABLET BY MOUTH EVERY DAY FOR GOUT (Patient taking differently: Take 100 mg by mouth daily. ) 90 tablet  1   BIDIL 20-37.5 MG tablet TAKE 1 TABLET BY MOUTH THREE TIMES DAILY (Patient taking differently: Take  1 tablet by mouth 3 (three) times daily. ) 270 tablet 0   ferrous sulfate 325 (65 FE) MG tablet Take 1 tablet (325 mg total) by mouth 2 (two) times daily with a meal. 180 tablet 1   metoprolol tartrate (LOPRESSOR) 25 MG tablet Take 25 mg by mouth daily.     Pitavastatin Calcium 1 MG TABS Take 1 tablet (1 mg total) by mouth daily. 90 tablet 1   torsemide (DEMADEX) 20 MG tablet TAKE 1 TABLET(20 MG) BY MOUTH TWICE DAILY (Patient taking differently: Take 20 mg by mouth 2 (two) times daily. ) 180 tablet 0    Allergies as of 05/07/2020 - Review Complete 05/07/2020  Allergen Reaction Noted   Amoxicillin Diarrhea    Celebrex [celecoxib]  10/10/2011   Enalapril Cough 02/19/2011   Lipitor [atorvastatin]  04/14/2014   Amlodipine besylate  05/25/2010   Codeine sulfate     Hydrocodone-acetaminophen       Review of Systems:    Constitutional: No weight loss, fever or chills Skin: No rash  Cardiovascular: No chest pain Respiratory: No SOB  Gastrointestinal: See HPI and otherwise negative Genitourinary: No dysuria  Neurological: No headache Musculoskeletal: No new muscle or joint pain Hematologic: No bleeding  Psychiatric: No history of depression or anxiety    Physical Exam:  Vital signs in last 24 hours: Temp:  [98 F (36.7 C)-98.3 F (36.8 C)] 98.3 F (36.8 C) (10/09 1044) Pulse Rate:  [62-71] 65 (10/09 1140) Resp:  [16-20] 20 (10/09 1140) BP: (137-174)/(44-48) 145/45 (10/09 1140) SpO2:  [95 %-100 %] 100 % (10/09 1140) Weight:  [88.9 kg] 88.9 kg (10/09 1034)   General:   Pleasant overweight Cacuasian female appears to be in NAD, Well developed, Well nourished, alert and cooperative Head:  Normocephalic and atraumatic. Eyes:   PEERL, EOMI. No icterus. Conjunctiva pink. Ears:  Normal auditory acuity. Neck:  Supple Throat: Oral cavity and pharynx without inflammation,  swelling or lesion.  Lungs: Respirations even and unlabored. Lungs clear to auscultation bilaterally.   No wheezes, crackles, or rhonchi.  Heart: Normal S1, S2. No MRG. Regular rate and rhythm. No peripheral edema, cyanosis or pallor.  Abdomen:  Soft, nondistended, mild RLQ and LLQ ttp. No rebound or guarding. Normal bowel sounds. No appreciable masses or hepatomegaly. Rectal:  Not performed.  Msk:  Symmetrical without gross deformities. Peripheral pulses intact.  Extremities:  Without edema, no deformity or joint abnormality.  Neurologic:  Alert and  oriented x4;  grossly normal neurologically.  Skin:   Dry and intact without significant lesions or rashes. Psychiatric: Demonstrates good judgement and reason without abnormal affect or behaviors.   LAB RESULTS: Recent Labs    05/07/20 1036  WBC 10.2  HGB 6.3*  HCT 20.6*  PLT 251   BMET Recent Labs    05/07/20 1036  NA 137  K 3.6  CL 103  CO2 26  GLUCOSE 140*  BUN 39*  CREATININE 1.55*  CALCIUM 8.7*   LFT Recent Labs    05/07/20 1036  PROT 6.6  ALBUMIN 3.4*  AST 13*  ALT 10  ALKPHOS 51  BILITOT 0.4     Impression / Plan:   Impression: 1.  GI bleed: History of GI bleeds from AVMs in stomach, duodenum and cecum, last APC in January 2020 (inability to go past the transverse colon at that time due to abdominal hernia), typically managed with p.o. iron and IV iron infusions as well as transfusions as needed by her PCP, she tells  me the last blood transfusion she has required was in January, now 5 days of bleeding; most likely from known AVMs 2.  History of AVMs 3.  Anemia: Related to #1, hemoglobin 6.3 (baseline around 9/10), 2 units PRBCs ordered 4.  Chronic combined systolic/diastolic HF 5.  Paroxysmal A. fib: No anticoagulation due to recurrent GI bleeds 6.  Chronic hypoxic respiratory failure: Baseline O2 at 2 L via nasal cannula 7.  CKD stage III  Plan: 1.  Continue to monitor hemoglobin with transfusion as  needed less than 7 2.  Scheduled patient for an EGD tomorrow with Dr. Havery Moros.  Did discuss risks, benefits, limitations and alternatives and the patient agrees to proceed. 3. Continue Pantoprazole 66m IV BID 4.  Patient can be on clear liquids today and n.p.o. after midnight 5.  Please await any further recommendations from Dr. AHavery Moroslater this afternoon.  Thank you for your kind consultation, we will continue to follow.  JLavone NianLemmon  05/07/2020, 12:08 PM

## 2020-05-08 ENCOUNTER — Encounter (HOSPITAL_COMMUNITY): Payer: Self-pay | Admitting: Internal Medicine

## 2020-05-08 ENCOUNTER — Observation Stay (HOSPITAL_COMMUNITY): Payer: Medicare Other | Admitting: Certified Registered Nurse Anesthetist

## 2020-05-08 ENCOUNTER — Encounter (HOSPITAL_COMMUNITY)
Admission: EM | Disposition: A | Discharge status: Home / Self Care | Payer: Self-pay | Source: Home / Self Care | Attending: Internal Medicine

## 2020-05-08 DIAGNOSIS — I872 Venous insufficiency (chronic) (peripheral): Secondary | ICD-10-CM | POA: Diagnosis present

## 2020-05-08 DIAGNOSIS — Z01810 Encounter for preprocedural cardiovascular examination: Secondary | ICD-10-CM | POA: Diagnosis not present

## 2020-05-08 DIAGNOSIS — N1832 Chronic kidney disease, stage 3b: Secondary | ICD-10-CM | POA: Diagnosis present

## 2020-05-08 DIAGNOSIS — E785 Hyperlipidemia, unspecified: Secondary | ICD-10-CM | POA: Diagnosis present

## 2020-05-08 DIAGNOSIS — G4733 Obstructive sleep apnea (adult) (pediatric): Secondary | ICD-10-CM | POA: Diagnosis present

## 2020-05-08 DIAGNOSIS — D849 Immunodeficiency, unspecified: Secondary | ICD-10-CM | POA: Diagnosis present

## 2020-05-08 DIAGNOSIS — J449 Chronic obstructive pulmonary disease, unspecified: Secondary | ICD-10-CM | POA: Diagnosis present

## 2020-05-08 DIAGNOSIS — D62 Acute posthemorrhagic anemia: Secondary | ICD-10-CM | POA: Diagnosis present

## 2020-05-08 DIAGNOSIS — M109 Gout, unspecified: Secondary | ICD-10-CM | POA: Diagnosis present

## 2020-05-08 DIAGNOSIS — K5730 Diverticulosis of large intestine without perforation or abscess without bleeding: Secondary | ICD-10-CM | POA: Diagnosis not present

## 2020-05-08 DIAGNOSIS — Z539 Procedure and treatment not carried out, unspecified reason: Secondary | ICD-10-CM | POA: Diagnosis present

## 2020-05-08 DIAGNOSIS — K5520 Angiodysplasia of colon without hemorrhage: Secondary | ICD-10-CM | POA: Diagnosis not present

## 2020-05-08 DIAGNOSIS — D649 Anemia, unspecified: Secondary | ICD-10-CM | POA: Diagnosis not present

## 2020-05-08 DIAGNOSIS — Z20822 Contact with and (suspected) exposure to covid-19: Secondary | ICD-10-CM | POA: Diagnosis present

## 2020-05-08 DIAGNOSIS — I5032 Chronic diastolic (congestive) heart failure: Secondary | ICD-10-CM | POA: Diagnosis not present

## 2020-05-08 DIAGNOSIS — K420 Umbilical hernia with obstruction, without gangrene: Secondary | ICD-10-CM | POA: Diagnosis not present

## 2020-05-08 DIAGNOSIS — K922 Gastrointestinal hemorrhage, unspecified: Secondary | ICD-10-CM | POA: Diagnosis present

## 2020-05-08 DIAGNOSIS — K5521 Angiodysplasia of colon with hemorrhage: Secondary | ICD-10-CM | POA: Diagnosis not present

## 2020-05-08 DIAGNOSIS — K279 Peptic ulcer, site unspecified, unspecified as acute or chronic, without hemorrhage or perforation: Secondary | ICD-10-CM | POA: Diagnosis not present

## 2020-05-08 DIAGNOSIS — I440 Atrioventricular block, first degree: Secondary | ICD-10-CM | POA: Diagnosis not present

## 2020-05-08 DIAGNOSIS — K648 Other hemorrhoids: Secondary | ICD-10-CM | POA: Diagnosis not present

## 2020-05-08 DIAGNOSIS — K219 Gastro-esophageal reflux disease without esophagitis: Secondary | ICD-10-CM | POA: Diagnosis present

## 2020-05-08 DIAGNOSIS — K921 Melena: Secondary | ICD-10-CM | POA: Diagnosis not present

## 2020-05-08 DIAGNOSIS — K640 First degree hemorrhoids: Secondary | ICD-10-CM | POA: Diagnosis present

## 2020-05-08 DIAGNOSIS — Z6838 Body mass index (BMI) 38.0-38.9, adult: Secondary | ICD-10-CM | POA: Diagnosis not present

## 2020-05-08 DIAGNOSIS — K56699 Other intestinal obstruction unspecified as to partial versus complete obstruction: Secondary | ICD-10-CM | POA: Diagnosis not present

## 2020-05-08 DIAGNOSIS — K31819 Angiodysplasia of stomach and duodenum without bleeding: Secondary | ICD-10-CM | POA: Diagnosis not present

## 2020-05-08 DIAGNOSIS — I480 Paroxysmal atrial fibrillation: Secondary | ICD-10-CM | POA: Diagnosis not present

## 2020-05-08 DIAGNOSIS — I130 Hypertensive heart and chronic kidney disease with heart failure and stage 1 through stage 4 chronic kidney disease, or unspecified chronic kidney disease: Secondary | ICD-10-CM | POA: Diagnosis not present

## 2020-05-08 DIAGNOSIS — J9611 Chronic respiratory failure with hypoxia: Secondary | ICD-10-CM | POA: Diagnosis not present

## 2020-05-08 DIAGNOSIS — Z823 Family history of stroke: Secondary | ICD-10-CM | POA: Diagnosis not present

## 2020-05-08 DIAGNOSIS — I5042 Chronic combined systolic (congestive) and diastolic (congestive) heart failure: Secondary | ICD-10-CM | POA: Diagnosis not present

## 2020-05-08 DIAGNOSIS — R001 Bradycardia, unspecified: Secondary | ICD-10-CM | POA: Diagnosis present

## 2020-05-08 DIAGNOSIS — D500 Iron deficiency anemia secondary to blood loss (chronic): Secondary | ICD-10-CM | POA: Diagnosis not present

## 2020-05-08 DIAGNOSIS — D6489 Other specified anemias: Secondary | ICD-10-CM | POA: Diagnosis not present

## 2020-05-08 DIAGNOSIS — R011 Cardiac murmur, unspecified: Secondary | ICD-10-CM | POA: Diagnosis not present

## 2020-05-08 HISTORY — PX: ESOPHAGOGASTRODUODENOSCOPY (EGD) WITH PROPOFOL: SHX5813

## 2020-05-08 HISTORY — PX: HOT HEMOSTASIS: SHX5433

## 2020-05-08 LAB — BPAM RBC
Blood Product Expiration Date: 202111052359
Blood Product Expiration Date: 202111052359
ISSUE DATE / TIME: 202110091238
ISSUE DATE / TIME: 202110091620
Unit Type and Rh: 5100
Unit Type and Rh: 5100

## 2020-05-08 LAB — HEMOGLOBIN AND HEMATOCRIT, BLOOD
HCT: 26.3 % — ABNORMAL LOW (ref 36.0–46.0)
HCT: 28.4 % — ABNORMAL LOW (ref 36.0–46.0)
HCT: 28.4 % — ABNORMAL LOW (ref 36.0–46.0)
HCT: 29.5 % — ABNORMAL LOW (ref 36.0–46.0)
Hemoglobin: 8.3 g/dL — ABNORMAL LOW (ref 12.0–15.0)
Hemoglobin: 9 g/dL — ABNORMAL LOW (ref 12.0–15.0)
Hemoglobin: 8.9 g/dL — ABNORMAL LOW (ref 12.0–15.0)
Hemoglobin: 9.4 g/dL — ABNORMAL LOW (ref 12.0–15.0)

## 2020-05-08 LAB — COMPREHENSIVE METABOLIC PANEL
ALT: 9 U/L (ref 0–44)
AST: 12 U/L — ABNORMAL LOW (ref 15–41)
Albumin: 3.1 g/dL — ABNORMAL LOW (ref 3.5–5.0)
Alkaline Phosphatase: 45 U/L (ref 38–126)
Anion gap: 7 (ref 5–15)
BUN: 31 mg/dL — ABNORMAL HIGH (ref 8–23)
CO2: 27 mmol/L (ref 22–32)
Calcium: 8.8 mg/dL — ABNORMAL LOW (ref 8.9–10.3)
Chloride: 105 mmol/L (ref 98–111)
Creatinine, Ser: 1.35 mg/dL — ABNORMAL HIGH (ref 0.44–1.00)
GFR, Estimated: 37 mL/min — ABNORMAL LOW (ref >60)
Glucose, Bld: 98 mg/dL (ref 70–99)
Potassium: 3.8 mmol/L (ref 3.5–5.1)
Sodium: 139 mmol/L (ref 135–145)
Total Bilirubin: 1 mg/dL (ref 0.3–1.2)
Total Protein: 6 g/dL — ABNORMAL LOW (ref 6.5–8.1)

## 2020-05-08 LAB — TYPE AND SCREEN
ABO/RH(D): O POS
Antibody Screen: POSITIVE
Donor AG Type: NEGATIVE
Donor AG Type: NEGATIVE
Unit division: 0
Unit division: 0

## 2020-05-08 SURGERY — ESOPHAGOGASTRODUODENOSCOPY (EGD) WITH PROPOFOL
Anesthesia: Monitor Anesthesia Care

## 2020-05-08 MED ORDER — ONDANSETRON HCL 4 MG/2ML IJ SOLN
INTRAMUSCULAR | Status: DC | PRN
  Administered 2020-05-08: 4 mg via INTRAVENOUS

## 2020-05-08 MED ORDER — LACTATED RINGERS IV SOLN: INTRAVENOUS | Status: DC

## 2020-05-08 MED ORDER — PROPOFOL 500 MG/50ML IV EMUL
INTRAVENOUS | Status: DC | PRN
  Administered 2020-05-08: 125 ug/kg/min via INTRAVENOUS

## 2020-05-08 MED ORDER — LIDOCAINE HCL (CARDIAC) PF 100 MG/5ML IV SOSY
PREFILLED_SYRINGE | INTRAVENOUS | Status: DC | PRN
  Administered 2020-05-08: 40 mg via INTRAVENOUS

## 2020-05-08 MED ORDER — PEG-KCL-NACL-NASULF-NA ASC-C 100 G PO SOLR
1.0000 | Freq: Once | ORAL | Status: AC
  Administered 2020-05-08: 200 g via ORAL
  Filled 2020-05-08: qty 1

## 2020-05-08 MED ORDER — PROPOFOL 10 MG/ML IV BOLUS
INTRAVENOUS | Status: DC | PRN
  Administered 2020-05-08 (×3): 20 mg via INTRAVENOUS

## 2020-05-08 MED ORDER — PHENYLEPHRINE 40 MCG/ML (10ML) SYRINGE FOR IV PUSH (FOR BLOOD PRESSURE SUPPORT)
PREFILLED_SYRINGE | INTRAVENOUS | Status: DC | PRN
  Administered 2020-05-08 (×2): 80 ug via INTRAVENOUS

## 2020-05-08 SURGICAL SUPPLY — 15 items

## 2020-05-08 NOTE — Anesthesia Preprocedure Evaluation (Signed)
Anesthesia Evaluation  Patient identified by MRN, date of birth, ID band Patient awake    Reviewed: Allergy & Precautions, NPO status , Patient's Chart, lab work & pertinent test results  Airway Mallampati: II  TM Distance: >3 FB     Dental  (+) Dental Advisory Given   Pulmonary asthma , sleep apnea , COPD, former smoker,    breath sounds clear to auscultation       Cardiovascular hypertension, Pt. on medications +CHF  + dysrhythmias Atrial Fibrillation  Rhythm:Regular Rate:Normal     Neuro/Psych negative neurological ROS     GI/Hepatic Neg liver ROS, PUD, GERD  ,  Endo/Other  negative endocrine ROS  Renal/GU CRFRenal disease     Musculoskeletal  (+) Arthritis ,   Abdominal   Peds  Hematology  (+) anemia ,   Anesthesia Other Findings   Reproductive/Obstetrics                             Anesthesia Physical Anesthesia Plan  ASA: III  Anesthesia Plan: MAC   Post-op Pain Management:    Induction:   PONV Risk Score and Plan: 2 and Propofol infusion, Ondansetron and Treatment may vary due to age or medical condition  Airway Management Planned: Natural Airway and Nasal Cannula  Additional Equipment:   Intra-op Plan:   Post-operative Plan:   Informed Consent: I have reviewed the patients History and Physical, chart, labs and discussed the procedure including the risks, benefits and alternatives for the proposed anesthesia with the patient or authorized representative who has indicated his/her understanding and acceptance.       Plan Discussed with:   Anesthesia Plan Comments:         Anesthesia Quick Evaluation

## 2020-05-08 NOTE — Progress Notes (Signed)
PROGRESS NOTE  [name] NUU:[id number] DOB: August 04, 1940 DOA: 05/07/2020 PCP: [name], MD   LOS: 0 days   Brief narrative: As per HPI,  [name] is a 79 y.o. female with medical history significant of recurrent GIB, HTN, combined HF.  Hospital with 1 week of rectal bleeding with increasing fatigue lightheadedness.  In the ED patient was noted to be FOBT positive and hemoglobin of 6.3.  Patient was then admitted to hospital for GI bleed.    Assessment/Plan:  Active Problems:   Symptomatic anemia   AVM (arteriovenous malformation) of colon with hemorrhage   Acute GI bleeding  GIB, iron deficiency symptomatic anemia Patient has had a history of recurrent GI bleed in the past.  History of AVM of stomach small bowel and colon.  Chronic IV iron therapy for iron deficiency anemia.  Hemoglobin of 6.3 on presentation.  Hemoglobin of 9.1 in July 2021. Status post  2 units of packed RBCs. GI on board.  Continue Protonix.  Gout Continue allopurinol  Essential HTN. continue Bidil.  BP on the higher side.  Chronic combined systolic/diastolic HF Continue BiDil.  beta-blocker was stopped in the past due to bradycardia   Atrial fibrillation. Not on beta-blockers due to bradycardia.  Not on anticoagulation due to recurrent GI bleed.  Currently rate controlled  Hyperlipidemia On statins  Chronic hypoxic respiratory failure   On oxygen at 2L/min by nasal cannula at baseline.  HKV4Q Patient with baseline creatinine of 1.2-1.4.  Creatinine of 1.3 today.  Monitor BMP.  DVT prophylaxis: SCDs Start: 05/07/20 1401   Code Status: Partial  Family Communication: spoke with the patient's son at bedside.   Status is: Observation  The patient will require care spanning > 2 midnights and should be moved to inpatient because: IV treatments appropriate due to intensity of illness or inability to take PO, Inpatient level of care appropriate due to severity of illness and GI  bleed requiring endoscopic evaluation  Dispo: The patient is from: Home              Anticipated d/c is to: Home              Anticipated d/c date is: 2 days              Patient currently is not medically stable to d/c.  Consultants:  GI  Procedures:  None yet  Antibiotics:  . None  Anti-infectives (From admission, onward)   None       Subjective: Today, patient was seen and examined at bedside. Denies nausea vomiting, no bowel movement since yesterday. No increasing shortness of breath.   Objective: Vitals:   05/08/20 0227 05/08/20 0546  BP: (!) 152/40 (!) 157/46  Pulse: 64 66  Resp: 16 15  Temp: 98 F (36.7 C) 98.3 F (36.8 C)  SpO2: 99% 99%    Intake/Output Summary (Last 24 hours) at 05/08/2020 0802 Last data filed at 05/08/2020 0600 Gross per 24 hour  Intake 1228 ml  Output 1950 ml  Net -722 ml   Filed Weights   05/07/20 1034  Weight: 88.9 kg   Body mass index is 38.28 kg/m.   Physical Exam: GENERAL: Patient is alert awake and oriented.  On nasal cannula oxygen, obese HENT: No scleral pallor or icterus. Pupils equally reactive to light. Oral mucosa is moist NECK: is supple, no gross swelling noted. CHEST:   Diminished breath sounds bilaterally. CVS: S1 and S2 heard, no murmur. Regular rate and  rhythm.  ABDOMEN: Soft, umblical hernia with tenderness,, bowel sounds are present. EXTREMITIES: No edema. CNS: Cranial nerves are intact. No focal motor deficits. SKIN: warm and dry without rashes.  Data Review: I have personally reviewed the following laboratory data and studies,  CBC: Recent Labs  Lab 05/07/20 1036 05/07/20 2200 05/08/20 0336  WBC 10.2  --   --   HGB 6.3* 8.4* 8.3*  HCT 20.6* 26.1* 26.3*  MCV 88.0  --   --   PLT 251  --   --    Basic Metabolic Panel: Recent Labs  Lab 05/07/20 1036 05/08/20 0336  NA 137 139  K 3.6 3.8  CL 103 105  CO2 26 27  GLUCOSE 140* 98  BUN 39* 31*  CREATININE 1.55* 1.35*  CALCIUM 8.7* 8.8*    Liver Function Tests: Recent Labs  Lab 05/07/20 1036 05/08/20 0336  AST 13* 12*  ALT 10 9  ALKPHOS 51 45  BILITOT 0.4 1.0  PROT 6.6 6.0*  ALBUMIN 3.4* 3.1*   No results for input(s): LIPASE, AMYLASE in the last 168 hours. No results for input(s): AMMONIA in the last 168 hours. Cardiac Enzymes: No results for input(s): CKTOTAL, CKMB, CKMBINDEX, TROPONINI in the last 168 hours. BNP (last 3 results) No results for input(s): BNP in the last 8760 hours.  ProBNP (last 3 results) No results for input(s): PROBNP in the last 8760 hours.  CBG: No results for input(s): GLUCAP in the last 168 hours. Recent Results (from the past 240 hour(s))  Respiratory Panel by RT PCR (Flu A&B, Covid) - Nasopharyngeal Swab     Status: None   Collection Time: 05/07/20 11:25 AM   Specimen: Nasopharyngeal Swab  Result Value Ref Range Status   SARS Coronavirus 2 by RT PCR NEGATIVE NEGATIVE Final    Comment: (NOTE) SARS-CoV-2 target nucleic acids are NOT DETECTED.  The SARS-CoV-2 RNA is generally detectable in upper respiratoy specimens during the acute phase of infection. The lowest concentration of SARS-CoV-2 viral copies this assay can detect is 131 copies/mL. A negative result does not preclude SARS-Cov-2 infection and should not be used as the sole basis for treatment or other patient management decisions. A negative result may occur with  improper specimen collection/handling, submission of specimen other than nasopharyngeal swab, presence of viral mutation(s) within the areas targeted by this assay, and inadequate number of viral copies (<131 copies/mL). A negative result must be combined with clinical observations, patient history, and epidemiological information. The expected result is Negative.  Fact Sheet for Patients:  PinkCheek.be  Fact Sheet for Healthcare Providers:  GravelBags.it  This test is no t yet approved or  cleared by the [place] FDA and  has been authorized for detection and/or diagnosis of SARS-CoV-2 by FDA under an Emergency Use Authorization (EUA). This EUA will remain  in effect (meaning this test can be used) for the duration of the COVID-19 declaration under Section 564(b)(1) of the Act, 21 U.S.C. section 360bbb-3(b)(1), unless the authorization is terminated or revoked sooner.     Influenza A by PCR NEGATIVE NEGATIVE Final   Influenza B by PCR NEGATIVE NEGATIVE Final    Comment: (NOTE) The Xpert Xpress SARS-CoV-2/FLU/RSV assay is intended as an aid in  the diagnosis of influenza from Nasopharyngeal swab specimens and  should not be used as a sole basis for treatment. Nasal washings and  aspirates are unacceptable for Xpert Xpress SARS-CoV-2/FLU/RSV  testing.  Fact Sheet for Patients: PinkCheek.be  Fact Sheet for Healthcare Providers:  GravelBags.it  This test is not yet approved or cleared by the [place] and  has been authorized for detection and/or diagnosis of SARS-CoV-2 by  FDA under an Emergency Use Authorization (EUA). This EUA will remain  in effect (meaning this test can be used) for the duration of the  Covid-19 declaration under Section 564(b)(1) of the Act, 21  U.S.C. section 360bbb-3(b)(1), unless the authorization is  terminated or revoked. Performed at [facility], [place] [address] [place], [place] [address]      Studies: No results found.    Flora Lipps, MD  [facility] 05/08/2020

## 2020-05-08 NOTE — Interval H&P Note (Signed)
History and Physical Interval Note:  05/08/2020 3:25 PM  [name]  has presented today for surgery, with the diagnosis of Gi Bleed.  The various methods of treatment have been discussed with the patient and family. After consideration of risks, benefits and other options for treatment, the patient has consented to  Procedure(s): ESOPHAGOGASTRODUODENOSCOPY (EGD) WITH PROPOFOL (N/A) as a surgical intervention.  The patient's history has been reviewed, patient examined, no change in status, stable for surgery.  I have reviewed the patient's chart and labs.  Questions were answered to the patient's satisfaction.     [place]

## 2020-05-08 NOTE — Transfer of Care (Signed)
Immediate Anesthesia Transfer of Care Note  Patient: [name]  Procedure(s) Performed: ESOPHAGOGASTRODUODENOSCOPY (EGD) WITH PROPOFOL (N/A )  Patient Location: PACU  Anesthesia Type:MAC  Level of Consciousness: awake, alert , oriented and patient cooperative  Airway & Oxygen Therapy: Patient Spontanous Breathing and Patient connected to face mask oxygen  Post-op Assessment: Report given to RN and Post -op Vital signs reviewed and stable  Post vital signs: Reviewed and stable  Last Vitals:  Vitals Value Taken Time  BP 132/44   Temp    Pulse 74 05/08/20 1605  Resp 19 05/08/20 1605  SpO2 99 % 05/08/20 1605  Vitals shown include unvalidated device data.  Last Pain:  Vitals:   05/08/20 1526  TempSrc: Temporal  PainSc: 4          Complications: No complications documented.

## 2020-05-08 NOTE — Anesthesia Postprocedure Evaluation (Signed)
Anesthesia Post Note  Patient: CANDELA KRUL  Procedure(s) Performed: ESOPHAGOGASTRODUODENOSCOPY (EGD) WITH PROPOFOL (N/A ) HOT HEMOSTASIS (ARGON PLASMA COAGULATION/BICAP) (N/A )     Patient location during evaluation: PACU Anesthesia Type: MAC Level of consciousness: awake and alert Pain management: pain level controlled Vital Signs Assessment: post-procedure vital signs reviewed and stable Respiratory status: spontaneous breathing, nonlabored ventilation, respiratory function stable and patient connected to nasal cannula oxygen Cardiovascular status: stable and blood pressure returned to baseline Postop Assessment: no apparent nausea or vomiting Anesthetic complications: no   No complications documented.  Last Vitals:  Vitals:   05/08/20 1526 05/08/20 1615  BP: (!) 192/44 (!) 160/58  Pulse: 71 68  Resp: 20 17  Temp: 36.7 C   SpO2: 100% 98%    Last Pain:  Vitals:   05/08/20 1605  TempSrc:   PainSc: 4                  Tiajuana Amass

## 2020-05-08 NOTE — Progress Notes (Signed)
Telephone Order for bowel prep given by Dr. Havery Moros. Same startd and patient tolerating well. Patient will be going in for colonoscopy per GI doctor. Patient informed of the procedure. Thyra Breed RN, MSN

## 2020-05-08 NOTE — Op Note (Addendum)
[facility] Patient Name: [name] Procedure Date: 05/08/2020 MRN: [id number] Attending MD: Carlota Raspberry. Karry Barrilleaux , MD Date of Birth: [birth year] CSN: [id number] Age: 79 Admit Type: Inpatient Procedure:                Upper GI endoscopy Indications:              Anemia / iron deficiency due to chronic blood loss                            from known AVMs of the upper and lower GI tract,                            known cecal AVMs however due to periumbilical                            hernia, last colonoscopy not successful. EGD to                            clear upper tract. Providers:                Carlota Raspberry. Havery Moros, MD, [facility], RN, Laverda Sorenson, Technician Referring MD:              Medicines:                Monitored Anesthesia Care Complications:            No immediate complications. Estimated blood loss:                            Minimal. Estimated Blood Loss:     Estimated blood loss was minimal. Procedure:                Pre-Anesthesia Assessment:                           - Prior to the procedure, a History and Physical                            was performed, and patient medications and                            allergies were reviewed. The patient's tolerance of                            previous anesthesia was also reviewed. The risks                            and benefits of the procedure and the sedation                            options and risks were discussed with the patient.  All questions were answered, and informed consent                            was obtained. Prior Anticoagulants: The patient has                            taken no previous anticoagulant or antiplatelet                            agents. ASA Grade Assessment: III - A patient with                            severe systemic disease. After reviewing the risks                            and benefits, the  patient was deemed in                            satisfactory condition to undergo the procedure.                           After obtaining informed consent, the endoscope was                            passed under direct vision. Throughout the                            procedure, the patient's blood pressure, pulse, and                            oxygen saturations were monitored continuously. The                            [id number]-[id number] ([id number]) was introduced through the                            mouth, and advanced to the second part of duodenum.                            The upper GI endoscopy was accomplished without                            difficulty. The patient tolerated the procedure                            well. Scope In: Scope Out: Findings:      Esophagogastric landmarks were identified: the Z-line was found at 38       cm, the gastroesophageal junction was found at 38 cm and the upper       extent of the gastric folds was found at 38 cm from the incisors.      The exam of the esophagus was otherwise normal.      The entire examined stomach was normal.      Three diminutive angiodysplastic lesions without  bleeding were found in       the duodenal bulb. Fulguration to ablate the lesions to prevent bleeding       by argon plasma was successful.      Three diminutive angiodysplastic lesions without bleeding were found in       the second portion of the duodenum. Fulguration to ablate the lesions to       prevent bleeding by argon plasma was successful.      The exam of the duodenum was otherwise normal. Impression:               - Esophagogastric landmarks identified.                           - Normal esophagus                           - Normal stomach.                           - Three non-bleeding angiodysplastic lesions in the                            duodenum. Treated with argon plasma coagulation                            (APC).                           -  Three non-bleeding angiodysplastic lesions in the                            duodenum. Treated with argon plasma coagulation                            (APC).                           While a few diminutive AVMs were noted on this exam                            and treated, I don't think they are likely the                            cause for her recent bleeding and worsening anemia.                            I suspect, unfortunately, that the cecal AVMs are                            more than likely the cause of her symptoms. This is                            problematic as they were not able to be treated due                            to  last colonscopy which was aborted due to portion                            of transverse colon is in the umbilical hernia. I                            will discuss options with the patient and my                            colleagues who will assume her GI care tomorow -                            consider bowel prep for colonscopy and clearing                            distal half of her colon, see if cecal intubation                            if possible, vs. surgical evaluation to repair                            umbilical hernia. Moderate Sedation:      No moderate sedation, case performed with MAC Recommendation:           - Return patient to hospital ward for ongoing care.                           - Clear liquid diet.                           - Continue present medications.                           - As above will discuss options with patient,                            possible bowel prep this evening for colonoscopy                            tomorrow                           - Trend Hgb, monitor for recurrent bleeding Procedure Code(s):        --- Professional ---                           [phone], Esophagogastroduodenoscopy, flexible,                            transoral; with control of bleeding, any method Diagnosis Code(s):         --- Professional ---                           K31.819, Angiodysplasia of stomach and duodenum  without bleeding                           D50.0, Iron deficiency anemia secondary to blood                            loss (chronic) CPT copyright 2019 [employer]. All rights reserved. The codes documented in this report are preliminary and upon coder review may  be revised to meet current compliance requirements. Remo Lipps P. Eavan Gonterman, MD 05/08/2020 4:09:28 PM This report has been signed electronically. Number of Addenda: 0

## 2020-05-08 NOTE — Progress Notes (Signed)
See EGD note for details of that exam. Diminutive duodenal AVMs ablated but I don't think the cause of her recent symptoms. I suspect colonic AVMs are the more likely cause (known in the cecum). She does have a periumbilical hernia which may be prohibitive to complete a colonoscopy given her history of this, but reasonable to try colonoscopy and at least clear her left colon, complete the exam if possible. If complete colonoscopy is not possible, will need to discuss possible umbilical hernia repair with general surgery however she is higher than average risk for surgery. Given she had bleeding and worsening anemia despite IV iron, I suspect this will continue to be an issue moving forward for her without definitive therapy. I discussed bowel prep with the patient who was agreeable. I signed out her care to Dr. Hilarie Fredrickson who is assuming the GI service tomorrow who was in agreement with this plan. Prep tonight, clear liquids okay with this. Plan for colonoscopy attempt tomorrow, time to be determined.  Montmorenci Cellar, MD [facility]

## 2020-05-09 ENCOUNTER — Inpatient Hospital Stay (HOSPITAL_COMMUNITY): Payer: Medicare Other | Admitting: Anesthesiology

## 2020-05-09 ENCOUNTER — Encounter (HOSPITAL_COMMUNITY)
Admission: EM | Disposition: A | Discharge status: Home / Self Care | Payer: Self-pay | Source: Home / Self Care | Attending: Internal Medicine

## 2020-05-09 ENCOUNTER — Encounter (HOSPITAL_COMMUNITY): Payer: Self-pay | Admitting: Gastroenterology

## 2020-05-09 DIAGNOSIS — K921 Melena: Secondary | ICD-10-CM

## 2020-05-09 DIAGNOSIS — D500 Iron deficiency anemia secondary to blood loss (chronic): Secondary | ICD-10-CM

## 2020-05-09 HISTORY — PX: COLONOSCOPY WITH PROPOFOL: SHX5780

## 2020-05-09 LAB — CBC
HCT: 29.3 % — ABNORMAL LOW (ref 36.0–46.0)
Hemoglobin: 8.9 g/dL — ABNORMAL LOW (ref 12.0–15.0)
MCH: 27.8 pg (ref 26.0–34.0)
MCHC: 30.4 g/dL (ref 30.0–36.0)
MCV: 91.6 fL (ref 80.0–100.0)
Platelets: 205 10*3/uL (ref 150–400)
RBC: 3.2 MIL/uL — ABNORMAL LOW (ref 3.87–5.11)
RDW: 14.6 % (ref 11.5–15.5)
WBC: 9 10*3/uL (ref 4.0–10.5)
nRBC: 0 % (ref 0.0–0.2)

## 2020-05-09 LAB — BASIC METABOLIC PANEL
Anion gap: 10 (ref 5–15)
BUN: 28 mg/dL — ABNORMAL HIGH (ref 8–23)
CO2: 26 mmol/L (ref 22–32)
Calcium: 9.1 mg/dL (ref 8.9–10.3)
Chloride: 104 mmol/L (ref 98–111)
Creatinine, Ser: 1.64 mg/dL — ABNORMAL HIGH (ref 0.44–1.00)
GFR, Estimated: 29 mL/min — ABNORMAL LOW (ref >60)
Glucose, Bld: 109 mg/dL — ABNORMAL HIGH (ref 70–99)
Potassium: 3.9 mmol/L (ref 3.5–5.1)
Sodium: 140 mmol/L (ref 135–145)

## 2020-05-09 LAB — HEMOGLOBIN AND HEMATOCRIT, BLOOD
HCT: 35.3 % — ABNORMAL LOW (ref 36.0–46.0)
Hemoglobin: 10.9 g/dL — ABNORMAL LOW (ref 12.0–15.0)

## 2020-05-09 SURGERY — COLONOSCOPY WITH PROPOFOL
Anesthesia: Monitor Anesthesia Care

## 2020-05-09 MED ORDER — PROPOFOL 10 MG/ML IV BOLUS
INTRAVENOUS | Status: DC | PRN
  Administered 2020-05-09 (×3): 20 mg via INTRAVENOUS

## 2020-05-09 MED ORDER — PROPOFOL 500 MG/50ML IV EMUL
INTRAVENOUS | Status: DC | PRN
  Administered 2020-05-09: 125 ug/kg/min via INTRAVENOUS

## 2020-05-09 MED ORDER — PROPOFOL 500 MG/50ML IV EMUL
INTRAVENOUS | Status: AC
  Filled 2020-05-09: qty 50

## 2020-05-09 MED ORDER — LACTATED RINGERS IV SOLN: INTRAVENOUS | Status: DC | PRN

## 2020-05-09 MED ORDER — EPHEDRINE SULFATE-NACL 50-0.9 MG/10ML-% IV SOSY
PREFILLED_SYRINGE | INTRAVENOUS | Status: DC | PRN
  Administered 2020-05-09: 10 mg via INTRAVENOUS
  Administered 2020-05-09: 5 mg via INTRAVENOUS

## 2020-05-09 MED ORDER — LIDOCAINE 2% (20 MG/ML) 5 ML SYRINGE
INTRAMUSCULAR | Status: DC | PRN
  Administered 2020-05-09: 100 mg via INTRAVENOUS

## 2020-05-09 SURGICAL SUPPLY — 21 items

## 2020-05-09 NOTE — Telephone Encounter (Signed)
Pt has been informed lab was ordered via VM

## 2020-05-09 NOTE — Interval H&P Note (Signed)
History and Physical Interval Note:  05/09/2020 11:47 AM  [name]  has presented today for surgery, with the diagnosis of gi bleed, colonic AVMs.  The various methods of treatment have been discussed with the patient and family. After consideration of risks, benefits and other options for treatment, the patient has consented to  Procedure(s): COLONOSCOPY WITH PROPOFOL (N/A) as a surgical intervention.  The patient's history has been reviewed, patient examined, no change in status, stable for surgery.  I have reviewed the patient's chart and labs.  Questions were answered to the patient's satisfaction.     Pricilla Riffle. [employer] Plan

## 2020-05-09 NOTE — Consult Note (Signed)
[name] 1940-10-25  [id number].    Requesting MD: Fuller Plan Chief Complaint/Reason for Consult: Chronically incarcerated umbilical hernia  HPI:  Patient is a 79 year old female who was admitted to [facility] 05/07/20 for GI bleeding. She reported some bleeding Friday, 04/29/20, which stopped the weekend following and then started bleeding again Monday, 05/02/20 and has not resolved fully since then. She started to feel weak and lightheaded on day of admission and decided to come in for evaluation. She has had issues with intermittent GI bleeding for about 5 years. Patient has known UGI and cecal AVMs. On admission, hgb was found to be 6.3 and FOBT was positive. Patient underwent EGD 10/10 and 3 non-bleeding lesions were found in the duodenum and treated with argon plasma. Patient underwent colonoscopy today and scope was unable to traverse past transverse colon secondary to umbilical hernia. She had the same issue with bleeding and attempt at colonoscopy in January of this year. We evaluated patient for possible umbilical hernia repair at that time and recommended outpatient follow up. Per the patient, she was told by hospital doctor and her primary doctor that she was too high risk to consider surgery due to multiple medical comorbidities. We are asked to evaluate for hernia repair again.    Currently, patient is still having ongoing bloody stools, although hgb appears to have stabilized s/p 2 units PRBC. She reports occasional pain and firmness in hernia. Hernia does not ever fully reduce. She reports this hernia has been present for over 15 years. She denies nausea or vomiting.   PMH otherwise significant for H/O Ulcerative colitis, HTN, A fib, Cor pulmonale, Chronic combined CHF, PVD, COPD/O2 dependence, HLD, CKD stage III, MGUS, OSA, Obesity, acute on chronic anemia secondary to chronic GIB, and gout. She reports that she currently is receiving iron infusions  about every 3 months. She does not report any changes in oxygen requirements recently or changes in chest pain or SOB. She has never had any past abdominal surgery. She quit smoking in the late 1990s, and denies alcohol or illicit drug use. She lives at home with her husband and eldest daughter and ambulates with a cane. Allergies listed in chart reviewed.   ROS: Review of Systems  Constitutional: Positive for malaise/fatigue. Negative for chills and fever.  Respiratory: Negative for shortness of breath and wheezing.   Cardiovascular: Negative for chest pain and palpitations.  Gastrointestinal: Positive for abdominal pain, blood in stool, diarrhea and melena. Negative for constipation, nausea and vomiting.  Genitourinary: Negative for dysuria, frequency and urgency.  Neurological: Positive for dizziness and weakness.  All other systems reviewed and are negative.   Family History  Problem Relation Age of Onset  . Stroke Mother   . Diabetes Mother   . Kidney cancer Mother   . Stomach cancer Mother   . Heart disease Mother   . Stroke Father   . Heart disease Brother   . Heart disease Brother   . Crohn's disease Brother   . Heart disease Sister        CATH,STENT  . Clotting disorder Sister   . Cervical cancer Sister   . Lung cancer Sister   . Heart attack Sister   . Diabetes Sister   . Colon cancer Neg Hx   . Esophageal cancer Neg Hx   . Rectal cancer Neg Hx     Past Medical History:  Diagnosis Date  . Allergy   . Angiodysplasia of cecum   .  Angiodysplasia of duodenum   . Arthritis   . Asteatotic eczema 02/25/2016  . Asthma   . Atrial fibrillation ([place]) 10/17/2017  . Blood transfusion without reported diagnosis   . Cataract   . Chest pain at rest 03/22/2010   Qualifier: Diagnosis of  By: Ronnald Ramp MD, Arvid Right.   . CHF (congestive heart failure) ([place]) 08/31/03  . CHF (congestive heart failure), NYHA class III, chronic, combined ([place]) 08/10/2017  . Chronic venous stasis dermatitis  of both lower extremities 06/02/2013  . CKD (chronic kidney disease), stage III ([place]) 06/20/2010   Estimated Creatinine Clearance: 34.8 mL/min (A) (by C-G formula based on SCr of 1.37 mg/dL (H)).   Marland Kitchen COLD (chronic obstructive lung disease) ([place])   . Colonic polyp 02/16/2008   Tubular adenoma  . COPD (chronic obstructive pulmonary disease) ([place]) 01/27/98  . Cor pulmonale ([place]) 11/16/2014  . D-dimer, elevated 01/01/2018  . GERD 07/30/1992  . Gout   . Heart murmur   . HTN (hypertension) 07/30/1988  . Hyperlipidemia 11/27/96  . Hyperlipidemia with target LDL less than 100 11/27/1996       . Kidney stone 1960, 1972, 1991  . MGUS (monoclonal gammopathy of unknown significance) 11/08/2016  . Morbid obesity ([place]) 04/19/2010   She agrees to work on her lifestyle modifications to help her lose weight.   . Neck pain on right side 01/01/2018  . OSA (obstructive sleep apnea) 06/22/2013  . Osteopenia, senile 02/05/2013   July 2014  -2.1 left femur -1.9 left forearm   . Other screening mammogram 02/05/2013  . Oxygen deficiency   . Prediabetes 11/13/2006  . Renal insufficiency   . Stenosis of cervical spine with myelopathy ([place]) 01/01/2018  . Ulcer of leg, chronic, right ([place]) 10/10/2011  . Ulcerative colitis   . ULCERATIVE COLITIS-LEFT SIDE 03/19/2008       . Vitamin B12 deficiency anemia 11/13/2006       . Vitamin D deficiency 02/06/2013    Past Surgical History:  Procedure Laterality Date  . BRONCHOSCOPY    . COLONOSCOPY    . COLONOSCOPY WITH PROPOFOL N/A 02/23/2015   Procedure: COLONOSCOPY WITH PROPOFOL;  Surgeon: Ladene Artist, MD;  Location: [facility];  Service: Endoscopy;  Laterality: N/A;  . COLONOSCOPY WITH PROPOFOL N/A 08/12/2018   Procedure: COLONOSCOPY WITH PROPOFOL;  Surgeon: Ladene Artist, MD;  Location: [facility];  Service: Endoscopy;  Laterality: N/A;  . ESOPHAGOGASTRODUODENOSCOPY (EGD) WITH PROPOFOL N/A 02/23/2015   Procedure: ESOPHAGOGASTRODUODENOSCOPY (EGD) WITH PROPOFOL;  Surgeon:  Ladene Artist, MD;  Location: [facility];  Service: Endoscopy;  Laterality: N/A;  . ESOPHAGOGASTRODUODENOSCOPY (EGD) WITH PROPOFOL N/A 08/12/2018   Procedure: ESOPHAGOGASTRODUODENOSCOPY (EGD) WITH PROPOFOL;  Surgeon: Ladene Artist, MD;  Location: [facility];  Service: Endoscopy;  Laterality: N/A;  . ESOPHAGOGASTRODUODENOSCOPY (EGD) WITH PROPOFOL N/A 05/08/2020   Procedure: ESOPHAGOGASTRODUODENOSCOPY (EGD) WITH PROPOFOL;  Surgeon: Yetta Flock, MD;  Location: [facility];  Service: Gastroenterology;  Laterality: N/A;  . HOT HEMOSTASIS N/A 08/12/2018   Procedure: HOT HEMOSTASIS (ARGON PLASMA COAGULATION/BICAP);  Surgeon: Ladene Artist, MD;  Location: [facility];  Service: Endoscopy;  Laterality: N/A;  EGD and Colon APC  . HOT HEMOSTASIS N/A 05/08/2020   Procedure: HOT HEMOSTASIS (ARGON PLASMA COAGULATION/BICAP);  Surgeon: Yetta Flock, MD;  Location: [facility];  Service: Gastroenterology;  Laterality: N/A;  . NO PAST SURGERIES    . POLYPECTOMY      Social History:  reports that she quit smoking about 23 years ago. She has  never used smokeless tobacco. She reports that she does not drink alcohol and does not use drugs.  Allergies:  Allergies  Allergen Reactions  . Amoxicillin Diarrhea    DID THE REACTION INVOLVE: Swelling of the face/tongue/throat, SOB, or low BP? N Sudden or severe rash/hives, skin peeling, or the inside of the mouth or nose? N Did it require medical treatment? N When did it last happen?MORE THAN 10 YEARS If all above answers are "NO", may proceed with cephalosporin use.   . Celebrex [Celecoxib]     edema  . Enalapril Cough  . Lipitor [Atorvastatin]     Muscle aches  . Amlodipine Besylate     REACTION: edema  . Codeine Sulfate     REACTION: Nausea  . Hydrocodone-Acetaminophen     REACTION: Nausea    Medications Prior to Admission  Medication Sig Dispense Refill  . acetaminophen (TYLENOL) 325 MG tablet Take 325-650 mg by  mouth daily as needed for moderate pain, fever or headache.     . albuterol (PROAIR HFA) 108 (90 Base) MCG/ACT inhaler Inhale 1-2 puffs into the lungs every 6 (six) hours as needed for wheezing or shortness of breath. 18 g 11  . allopurinol (ZYLOPRIM) 100 MG tablet TAKE 1 TABLET BY MOUTH EVERY DAY FOR GOUT (Patient taking differently: Take 100 mg by mouth daily. ) 90 tablet 1  . BIDIL 20-37.5 MG tablet TAKE 1 TABLET BY MOUTH THREE TIMES DAILY (Patient taking differently: Take 1 tablet by mouth 3 (three) times daily. ) 270 tablet 0  . ferrous sulfate 325 (65 FE) MG tablet Take 1 tablet (325 mg total) by mouth 2 (two) times daily with a meal. 180 tablet 1  . metoprolol tartrate (LOPRESSOR) 25 MG tablet Take 25 mg by mouth daily.    . Pitavastatin Calcium 1 MG TABS Take 1 tablet (1 mg total) by mouth daily. 90 tablet 1  . torsemide (DEMADEX) 20 MG tablet TAKE 1 TABLET(20 MG) BY MOUTH TWICE DAILY (Patient taking differently: Take 20 mg by mouth 2 (two) times daily. ) 180 tablet 0    Blood pressure (!) 178/53, pulse 73, temperature 98.6 F (37 C), temperature source Oral, resp. rate 15, height 5' (1.524 m), weight 88.9 kg, SpO2 100 %. Physical Exam:  General: pleasant, WD, obese female who is laying in bed in NAD HEENT: head is normocephalic, atraumatic.  Sclera are noninjected.  PERRL.  Ears and nose without any masses or lesions.  Mouth is pink and moist Heart: regular, rate, and rhythm.  Normal s1,s2. No obvious murmurs, gallops, or rubs noted.  Palpable radial and pedal pulses bilaterally Lungs: CTAB, no wheezes, rhonchi, or rales noted.  Respiratory effort nonlabored on 2L via nasal cannula Abd: soft, tender over umbilical hernia, ND, +BS, hernia is partially reducible but attempt at reduction limited by pain MS: mild edema of BLE  Skin: warm and dry, chronic skin changes of BLE associated with venous stasis Neuro: Cranial nerves 2-12 grossly intact, sensation grossly intact throughout Psych:  A&Ox3 with a flat affect.   Results for orders placed or performed during the hospital encounter of 05/07/20 (from the past 48 hour(s))  Hemoglobin and hematocrit, blood     Status: Abnormal   Collection Time: 05/07/20 10:00 PM  Result Value Ref Range   Hemoglobin 8.4 (L) 12.0 - 15.0 g/dL    Comment: REPEATED TO VERIFY POST TRANSFUSION SPECIMEN    HCT 26.1 (L) 36 - 46 %    Comment: Performed at [name]  [place] [address], [place], [place] [address]  Hemoglobin and hematocrit, blood     Status: Abnormal   Collection Time: 05/08/20  3:36 AM  Result Value Ref Range   Hemoglobin 8.3 (L) 12.0 - 15.0 g/dL   HCT 26.3 (L) 36 - 46 %    Comment: Performed at [facility], [place] [address] [place], [place] [address]  Comprehensive metabolic panel     Status: Abnormal   Collection Time: 05/08/20  3:36 AM  Result Value Ref Range   Sodium 139 135 - 145 mmol/L   Potassium 3.8 3.5 - 5.1 mmol/L   Chloride 105 98 - 111 mmol/L   CO2 27 22 - 32 mmol/L   Glucose, Bld 98 70 - 99 mg/dL    Comment: Glucose reference range applies only to samples taken after fasting for at least 8 hours.   BUN 31 (H) 8 - 23 mg/dL   Creatinine, Ser 1.35 (H) 0.44 - 1.00 mg/dL   Calcium 8.8 (L) 8.9 - 10.3 mg/dL   Total Protein 6.0 (L) 6.5 - 8.1 g/dL   Albumin 3.1 (L) 3.5 - 5.0 g/dL   AST 12 (L) 15 - 41 U/L   ALT 9 0 - 44 U/L   Alkaline Phosphatase 45 38 - 126 U/L   Total Bilirubin 1.0 0.3 - 1.2 mg/dL   GFR, Estimated 37 (L) >60 mL/min   Anion gap 7 5 - 15    Comment: Performed at [facility], [place] [address], [place], [place] [address]  Hemoglobin and hematocrit, blood     Status: Abnormal   Collection Time: 05/08/20 10:03 AM  Result Value Ref Range   Hemoglobin 9.0 (L) 12.0 - 15.0 g/dL   HCT 28.4 (L) 36 - 46 %    Comment: Performed at [facility], [place] [address], [place], [place] [address]  Hemoglobin and hematocrit, blood     Status:  Abnormal   Collection Time: 05/08/20  4:49 PM  Result Value Ref Range   Hemoglobin 8.9 (L) 12.0 - 15.0 g/dL   HCT 28.4 (L) 36 - 46 %    Comment: Performed at [facility], [place] [address], [place], [place] [address]  Hemoglobin and hematocrit, blood     Status: Abnormal   Collection Time: 05/08/20 10:30 PM  Result Value Ref Range   Hemoglobin 9.4 (L) 12.0 - 15.0 g/dL   HCT 29.5 (L) 36 - 46 %    Comment: Performed at [facility], [place] [address], [place], [place] [address]  Basic metabolic panel     Status: Abnormal   Collection Time: 05/09/20  4:03 AM  Result Value Ref Range   Sodium 140 135 - 145 mmol/L   Potassium 3.9 3.5 - 5.1 mmol/L   Chloride 104 98 - 111 mmol/L   CO2 26 22 - 32 mmol/L   Glucose, Bld 109 (H) 70 - 99 mg/dL    Comment: Glucose reference range applies only to samples taken after fasting for at least 8 hours.   BUN 28 (H) 8 - 23 mg/dL   Creatinine, Ser 1.64 (H) 0.44 - 1.00 mg/dL   Calcium 9.1 8.9 - 10.3 mg/dL   GFR, Estimated 29 (L) >60 mL/min   Anion gap 10 5 - 15    Comment: Performed at [facility], [place] [address], [place], [place] [address]  CBC     Status: Abnormal   Collection Time: 05/09/20  4:03 AM  Result Value Ref Range   WBC 9.0 4.0 - 10.5 K/uL   RBC 3.20 (L) 3.87 - 5.11 MIL/uL   Hemoglobin 8.9 (L) 12.0 - 15.0 g/dL   HCT 29.3 (L) 36 - 46 %   MCV 91.6 80.0 - 100.0 fL   MCH 27.8 26.0 - 34.0 pg   MCHC 30.4 30.0 - 36.0 g/dL   RDW 14.6 11.5 - 15.5 %   Platelets 205 150 - 400 K/uL   nRBC 0.0 0.0 - 0.2 %    Comment: Performed at [facility], [place] [address], [place], [place] [address]  Hemoglobin and hematocrit, blood     Status: Abnormal   Collection Time: 05/09/20 10:14 AM  Result Value Ref Range   Hemoglobin 10.9 (L) 12.0 - 15.0 g/dL   HCT 35.3 (L) 36 - 46 %    Comment: Performed at [facility], [place] [address] [place], [place] [address]   No results  found.    Assessment/Plan H/O Ulcerative colitis  HTN A fib - not on anticoagulation  Cor pulmonale Chronic combined CHF - last ECHO in March 2019 with EF 65-70% PVD COPD/O2 dependence HLD CKD stage III MGUS OSA Obesity - BMI 38.28 Gout   Acute on chronic anemia - s/p 2 units PRBC 10/9, hgb 8.9 this AM and then 10.9 later this AM, would continue to monitor GI bleeding - patient reports she is still having bloody stools - colonoscopy unable to be completed today secondary to umbilical hernia   Chronically incarcerated umbilical hernia - Patient has a difficult problem. She is not low risk for any surgical intervention and would certainly require cardiac and pulmonary clearance and medical optimization prior to any intervention. - I would recommend cardiology and pulmonary consultation while admitted to get this started - Whether she can have hernia repair as an outpatient vs while admitted I think entirely depends on whether she resolves current GI bleeding and is able to maintain hgb.  - Discussed with patient that given size of hernia and other risk factors that hernia repair would likely require mesh implantation - she is somewhat nervous about this - No emergent surgical intervention recommended but we will continue to follow to help determine best solution for this patient  FEN: CLD VTE: no chemical prophylaxis in setting of Acute on chronic anemia from GI bleeding  ID: no current abx  Norm Parcel, [facility] Surgery 05/09/2020, 3:09 PM Please see Amion for pager number during day hours 7:00am-4:30pm

## 2020-05-09 NOTE — Anesthesia Preprocedure Evaluation (Addendum)
Anesthesia Evaluation  Patient identified by MRN, date of birth, ID band Patient awake    Reviewed: Allergy & Precautions, NPO status , Patient's Chart, lab work & pertinent test results  Airway Mallampati: II  TM Distance: >3 FB Neck ROM: Full    Dental  (+) Dental Advisory Given, Upper Dentures   Pulmonary asthma , sleep apnea , COPD, former smoker,    breath sounds clear to auscultation       Cardiovascular hypertension, +CHF  + dysrhythmias Atrial Fibrillation + Valvular Problems/Murmurs  Rhythm:Regular Rate:Normal     Neuro/Psych negative neurological ROS  negative psych ROS   GI/Hepatic Neg liver ROS, PUD, GERD  ,  Endo/Other  negative endocrine ROS  Renal/GU Renal disease     Musculoskeletal  (+) Arthritis ,   Abdominal Normal abdominal exam  (+)   Peds  Hematology negative hematology ROS (+)   Anesthesia Other Findings   Reproductive/Obstetrics                            Anesthesia Physical Anesthesia Plan  ASA: III  Anesthesia Plan: MAC   Post-op Pain Management:    Induction: Intravenous  PONV Risk Score and Plan: 0 and Propofol infusion  Airway Management Planned: Natural Airway and Simple Face Mask  Additional Equipment: None  Intra-op Plan:   Post-operative Plan:   Informed Consent: I have reviewed the patients History and Physical, chart, labs and discussed the procedure including the risks, benefits and alternatives for the proposed anesthesia with the patient or authorized representative who has indicated his/her understanding and acceptance.       Plan Discussed with: CRNA  Anesthesia Plan Comments:        Anesthesia Quick Evaluation

## 2020-05-09 NOTE — Op Note (Signed)
[facility] Patient Name: [name] Procedure Date: 05/09/2020 MRN: [id number] Attending MD: Ladene Artist , MD Date of Birth: 1941-04-19 CSN: [id number] Age: 79 Admit Type: Inpatient Procedure:                Colonoscopy Indications:              Hematochezia, Iron deficiency anemia secondary to                            chronic blood loss Providers:                Pricilla Riffle. Fuller Plan, MD, Debi Vernell Barrier, RN, Elspeth Cho                            Tech., Technician, Danley Danker, CRNA Referring MD:             [facility] Medicines:                Monitored Anesthesia Care Complications:            No immediate complications. Estimated blood loss:                            None. Estimated Blood Loss:     Estimated blood loss: none. Procedure:                Pre-Anesthesia Assessment:                           - Prior to the procedure, a History and Physical                            was performed, and patient medications and                            allergies were reviewed. The patient's tolerance of                            previous anesthesia was also reviewed. The risks                            and benefits of the procedure and the sedation                            options and risks were discussed with the patient.                            All questions were answered, and informed consent                            was obtained. Prior Anticoagulants: The patient has                            taken no previous anticoagulant or antiplatelet                            agents. ASA  Grade Assessment: III - A patient with                            severe systemic disease. After reviewing the risks                            and benefits, the patient was deemed in                            satisfactory condition to undergo the procedure.                           After obtaining informed consent, the colonoscope                            was passed under  direct vision. Throughout the                            procedure, the patient's blood pressure, pulse, and                            oxygen saturations were monitored continuously. The                            [id number]-[id number] ([id number] Olympus pediatric colonscope                            was introduced through the anus with the intention                            of advancing to the cecum. The scope was advanced                            to the transverse colon before the procedure was                            aborted. Medications were not given. The rectum was                            photographed. The quality of the bowel preparation                            was excellent. The colonoscopy was technically                            difficult and complex due to bowel stenosis in the                            transverse colon associated with a periumbilical                            hernia. The patient tolerated the procedure well.  External abdominal pressure applied to the hernia                            throughout the scope insertion. Care was taken to                            limit insufflation and to suction extensively on                            withdrawl. Scope In: 12:18:15 PM Scope Out: 12:31:29 PM Scope Withdrawal Time: 0 hours 5 minutes 30 seconds  Total Procedure Duration: 0 hours 13 minutes 14 seconds  Findings:      The perianal and digital rectal examinations were normal.      Multiple medium-mouthed diverticula were found in the left colon. There       was no evidence of diverticular bleeding.      An extrinsic severe stenosis was found in the transverse colon due to a       nonreducible periumbilical hernia. Unable to safely traverse.      A segmental area of erythematous, friable, granular mucosa was found in       the transverse colon at the periumbilical hernia. Not biopsied.      Internal hemorrhoids were found during  retroflexion. The hemorrhoids       were small and Grade I (internal hemorrhoids that do not prolapse).      The exam was otherwise without abnormality on direct and retroflexion       views. Impression:               - Incomplete colonoscopy to the transverse colon.                           - Moderate diverticulosis in the left colon. There                            was no evidence of diverticular bleeding.                           - Severe stenosis in the transverse colon at the                            nonreducible periumblical hernia. Unable to safely                            travserse.                           - Segmental erythematous, friable, granular mucosa                            in the transverse colon at the periumbical hernia.                           - Internal hemorrhoids.                           - The examination was otherwise  normal on direct                            and retroflexion views.                           - No specimens collected. Moderate Sedation:      Not Applicable - Patient had care per Anesthesia. Recommendation:           - Patient has a contact number available for                            emergencies. The signs and symptoms of potential                            delayed complications were discussed with the                            patient. Return to normal activities tomorrow.                            Written discharge instructions were provided to the                            patient.                           - Resume previous diet.                           - Continue present medications.                           - Consider surgical consult for hernia repair. Will                            discuss with Dr. Hilarie Fredrickson who is covering the                            hospital service this week. Procedure Code(s):        --- Professional ---                           [phone], 27, Colonoscopy, flexible; diagnostic,                             including collection of specimen(s) by brushing or                            washing, when performed (separate procedure) Diagnosis Code(s):        --- Professional ---                           K64.0, First degree hemorrhoids                           K56.699, Other intestinal  obstruction unspecified                            as to partial versus complete obstruction                           K63.89, Other specified diseases of intestine                           K92.1, Melena (includes Hematochezia)                           D50.0, Iron deficiency anemia secondary to blood                            loss (chronic)                           K57.30, Diverticulosis of large intestine without                            perforation or abscess without bleeding CPT copyright 2019 [employer]. All rights reserved. The codes documented in this report are preliminary and upon coder review may  be revised to meet current compliance requirements. Ladene Artist, MD 05/09/2020 12:55:00 PM This report has been signed electronically. Number of Addenda: 0

## 2020-05-09 NOTE — Progress Notes (Signed)
PROGRESS NOTE  [name] ZJQ:[id number] DOB: Aug 02, 1940 DOA: 05/07/2020 PCP: [name], MD   LOS: 1 day   Brief narrative: As per HPI,  [name] is a 79 y.o. female with medical history significant of recurrent GIB, HTN, combined HF on home oxygen, presented to the hospital  with 1 week of rectal bleeding with increasing fatigue, lightheadedness.  In the ED, patient was noted to be FOBT positive and hemoglobin of 6.3.  Patient was then admitted to hospital for GI bleed.    Assessment/Plan:  Active Problems:   Symptomatic anemia   AVM (arteriovenous malformation) of small bowel, acquired with hemorrhage   Acute GI bleeding   GI bleed  GIB, iron deficiency symptomatic anemia Patient has had a history of recurrent GI bleed in the past.  History of AVM of stomach small bowel and colon.  Chronic IV iron therapy for iron deficiency anemia.  Hemoglobin of 6.3 on presentation.  Hemoglobin of 9.1 in July 2021. Status post  2 units of packed RBCs. GI on board.  Continue Protonix.  Status post EGD on 05/08/2020 with findings of few angiodysplasia in the duodenum nonbleeding and underwent APC treatment.  GI plan for colonoscopy today due to concerns of colonic AVMs likely contributing to bleeding.  Gout Continue allopurinol  Essential HTN. continue Bidil, torsemide  Chronic combined systolic/diastolic HF Continue BiDil, torsemide.  beta-blocker was stopped in the past due to bradycardia   Atrial fibrillation. Not on beta-blockers due to bradycardia.  Not on anticoagulation due to recurrent GI bleed.  Currently rate controlled  Hyperlipidemia On statins   Chronic hypoxic respiratory failure   On oxygen at 2L/min by nasal cannula at baseline.  WIO9B Patient with baseline creatinine of 1.2-1.4.  Creatinine of 1.6 today.  Monitor BMP.  DVT prophylaxis: SCDs Start: 05/07/20 1401   Code Status: Partial  Family Communication: None today.  Spoke with the patient's  son at bedside yesterday.   Status is: Inpatient  The patient is inpatient because: IV treatments appropriate due to intensity of illness or inability to take PO, Inpatient level of care appropriate due to severity of illness and GI bleed requiring endoscopic evaluation including colonoscopy  Dispo: The patient is from: Home              Anticipated d/c is to: Home              Anticipated d/c date is: 2 days              Patient currently is not medically stable to d/c.  Consultants:  GI  Procedures:  EGD on 05/08/2020  Antibiotics:  . None  Anti-infectives (From admission, onward)   None      Subjective: Today, patient was seen and examined at bedside.  Patient denies any nausea vomiting abdominal pain fever or chills.  Awaiting for colonoscopy evaluation.  Objective: Vitals:   05/09/20 0700 05/09/20 1121  BP: (!) 155/51 (!) 147/103  Pulse: 80 78  Resp: 20 15  Temp: 98.2 F (36.8 C) 97.6 F (36.4 C)  SpO2: 99% 100%    Intake/Output Summary (Last 24 hours) at 05/09/2020 1136 Last data filed at 05/09/2020 1000 Gross per 24 hour  Intake 1008 ml  Output 1500 ml  Net -492 ml   Filed Weights   05/07/20 1034  Weight: 88.9 kg   Body mass index is 38.28 kg/m.   Physical Exam: GENERAL: Patient is alert awake and oriented.  On nasal cannula  oxygen, obese HENT: No scleral pallor or icterus. Pupils equally reactive to light. Oral mucosa is moist NECK: is supple, no gross swelling noted. CHEST:   Diminished breath sounds bilaterally.  No obvious crackles or wheezes noted. CVS: S1 and S2 heard, no murmur. Regular rate and rhythm.  ABDOMEN: Soft, umblical hernia with tenderness,, bowel sounds are present. EXTREMITIES: No edema. CNS: Cranial nerves are intact. No focal motor deficits. SKIN: warm and dry without rashes.  Data Review: I have personally reviewed the following laboratory data and studies,  CBC: Recent Labs  Lab 05/07/20 1036 05/07/20 2200  05/08/20 1003 05/08/20 1649 05/08/20 2230 05/09/20 0403 05/09/20 1014  WBC 10.2  --   --   --   --  9.0  --   HGB 6.3*   < > 9.0* 8.9* 9.4* 8.9* 10.9*  HCT 20.6*   < > 28.4* 28.4* 29.5* 29.3* 35.3*  MCV 88.0  --   --   --   --  91.6  --   PLT 251  --   --   --   --  205  --    < > = values in this interval not displayed.   Basic Metabolic Panel: Recent Labs  Lab 05/07/20 1036 05/08/20 0336 05/09/20 0403  NA 137 139 140  K 3.6 3.8 3.9  CL 103 105 104  CO2 26 27 26   GLUCOSE 140* 98 109*  BUN 39* 31* 28*  CREATININE 1.55* 1.35* 1.64*  CALCIUM 8.7* 8.8* 9.1   Liver Function Tests: Recent Labs  Lab 05/07/20 1036 05/08/20 0336  AST 13* 12*  ALT 10 9  ALKPHOS 51 45  BILITOT 0.4 1.0  PROT 6.6 6.0*  ALBUMIN 3.4* 3.1*   No results for input(s): LIPASE, AMYLASE in the last 168 hours. No results for input(s): AMMONIA in the last 168 hours. Cardiac Enzymes: No results for input(s): CKTOTAL, CKMB, CKMBINDEX, TROPONINI in the last 168 hours. BNP (last 3 results) No results for input(s): BNP in the last 8760 hours.  ProBNP (last 3 results) No results for input(s): PROBNP in the last 8760 hours.  CBG: No results for input(s): GLUCAP in the last 168 hours. Recent Results (from the past 240 hour(s))  Respiratory Panel by RT PCR (Flu A&B, Covid) - Nasopharyngeal Swab     Status: None   Collection Time: 05/07/20 11:25 AM   Specimen: Nasopharyngeal Swab  Result Value Ref Range Status   SARS Coronavirus 2 by RT PCR NEGATIVE NEGATIVE Final    Comment: (NOTE) SARS-CoV-2 target nucleic acids are NOT DETECTED.  The SARS-CoV-2 RNA is generally detectable in upper respiratoy specimens during the acute phase of infection. The lowest concentration of SARS-CoV-2 viral copies this assay can detect is 131 copies/mL. A negative result does not preclude SARS-Cov-2 infection and should not be used as the sole basis for treatment or other patient management decisions. A negative result  may occur with  improper specimen collection/handling, submission of specimen other than nasopharyngeal swab, presence of viral mutation(s) within the areas targeted by this assay, and inadequate number of viral copies (<131 copies/mL). A negative result must be combined with clinical observations, patient history, and epidemiological information. The expected result is Negative.  Fact Sheet for Patients:  PinkCheek.be  Fact Sheet for Healthcare Providers:  GravelBags.it  This test is no t yet approved or cleared by the [place] FDA and  has been authorized for detection and/or diagnosis of SARS-CoV-2 by FDA under an Emergency Use Authorization (  EUA). This EUA will remain  in effect (meaning this test can be used) for the duration of the COVID-19 declaration under Section 564(b)(1) of the Act, 21 U.S.C. section 360bbb-3(b)(1), unless the authorization is terminated or revoked sooner.     Influenza A by PCR NEGATIVE NEGATIVE Final   Influenza B by PCR NEGATIVE NEGATIVE Final    Comment: (NOTE) The Xpert Xpress SARS-CoV-2/FLU/RSV assay is intended as an aid in  the diagnosis of influenza from Nasopharyngeal swab specimens and  should not be used as a sole basis for treatment. Nasal washings and  aspirates are unacceptable for Xpert Xpress SARS-CoV-2/FLU/RSV  testing.  Fact Sheet for Patients: PinkCheek.be  Fact Sheet for Healthcare Providers: GravelBags.it  This test is not yet approved or cleared by the [place] FDA and  has been authorized for detection and/or diagnosis of SARS-CoV-2 by  FDA under an Emergency Use Authorization (EUA). This EUA will remain  in effect (meaning this test can be used) for the duration of the  Covid-19 declaration under Section 564(b)(1) of the Act, 21  U.S.C. section 360bbb-3(b)(1), unless the authorization is  terminated  or revoked. Performed at [facility], [place] [address], [place], [place] [address]      Studies: No results found.    Flora Lipps, MD  [facility] 05/09/2020

## 2020-05-09 NOTE — Anesthesia Postprocedure Evaluation (Signed)
Anesthesia Post Note  Patient: [name]  Procedure(s) Performed: COLONOSCOPY WITH PROPOFOL (N/A )     Patient location during evaluation: PACU Anesthesia Type: MAC Level of consciousness: awake and alert Pain management: pain level controlled Vital Signs Assessment: post-procedure vital signs reviewed and stable Respiratory status: spontaneous breathing, nonlabored ventilation, respiratory function stable and patient connected to nasal cannula oxygen Cardiovascular status: stable and blood pressure returned to baseline Postop Assessment: no apparent nausea or vomiting Anesthetic complications: no   No complications documented.  Last Vitals:  Vitals:   05/09/20 1310 05/09/20 1328  BP: (!) 180/50 (!) 178/53  Pulse: 84 73  Resp: 18 15  Temp:  37 C  SpO2: 100% 100%    Last Pain:  Vitals:   05/09/20 1328  TempSrc: Oral  PainSc:                  Effie Berkshire

## 2020-05-09 NOTE — Progress Notes (Signed)
Patient states that she is in the "donut hole" with her insurance, where there is a gap in coverage. She states that she can not afford her prescriptions and would like to know if cheaper prescriptions can replace existing ones or if case management can help in anyway. Thanks!

## 2020-05-09 NOTE — Transfer of Care (Signed)
Immediate Anesthesia Transfer of Care Note  Patient: [name]  Procedure(s) Performed: COLONOSCOPY WITH PROPOFOL (N/A )  Patient Location: Endoscopy Unit  Anesthesia Type:MAC  Level of Consciousness: drowsy  Airway & Oxygen Therapy: Patient Spontanous Breathing and Patient connected to face mask oxygen  Post-op Assessment: Report given to RN and Post -op Vital signs reviewed and stable  Post vital signs: Reviewed and stable  Last Vitals:  Vitals Value Taken Time  BP    Temp    Pulse    Resp    SpO2      Last Pain:  Vitals:   05/09/20 1121  TempSrc: Oral  PainSc: 0-No pain      Patients Stated Pain Goal: 1 (59/93/57 0177)  Complications: No complications documented.

## 2020-05-10 ENCOUNTER — Inpatient Hospital Stay (HOSPITAL_COMMUNITY): Payer: Medicare Other

## 2020-05-10 ENCOUNTER — Encounter (HOSPITAL_COMMUNITY): Payer: Self-pay | Admitting: Internal Medicine

## 2020-05-10 DIAGNOSIS — Z01810 Encounter for preprocedural cardiovascular examination: Secondary | ICD-10-CM

## 2020-05-10 DIAGNOSIS — D649 Anemia, unspecified: Secondary | ICD-10-CM

## 2020-05-10 DIAGNOSIS — J411 Mucopurulent chronic bronchitis: Secondary | ICD-10-CM

## 2020-05-10 DIAGNOSIS — N1832 Chronic kidney disease, stage 3b: Secondary | ICD-10-CM

## 2020-05-10 DIAGNOSIS — I872 Venous insufficiency (chronic) (peripheral): Secondary | ICD-10-CM

## 2020-05-10 DIAGNOSIS — I480 Paroxysmal atrial fibrillation: Secondary | ICD-10-CM

## 2020-05-10 DIAGNOSIS — K5521 Angiodysplasia of colon with hemorrhage: Principal | ICD-10-CM

## 2020-05-10 DIAGNOSIS — I2781 Cor pulmonale (chronic): Secondary | ICD-10-CM

## 2020-05-10 DIAGNOSIS — I5032 Chronic diastolic (congestive) heart failure: Secondary | ICD-10-CM

## 2020-05-10 DIAGNOSIS — K922 Gastrointestinal hemorrhage, unspecified: Secondary | ICD-10-CM | POA: Diagnosis not present

## 2020-05-10 LAB — CBC
HCT: 27.3 % — ABNORMAL LOW (ref 36.0–46.0)
Hemoglobin: 8.4 g/dL — ABNORMAL LOW (ref 12.0–15.0)
MCH: 28 pg (ref 26.0–34.0)
MCHC: 30.8 g/dL (ref 30.0–36.0)
MCV: 91 fL (ref 80.0–100.0)
Platelets: 197 10*3/uL (ref 150–400)
RBC: 3 MIL/uL — ABNORMAL LOW (ref 3.87–5.11)
RDW: 14.7 % (ref 11.5–15.5)
WBC: 9.2 10*3/uL (ref 4.0–10.5)
nRBC: 0 % (ref 0.0–0.2)

## 2020-05-10 LAB — BASIC METABOLIC PANEL
Anion gap: 9 (ref 5–15)
BUN: 19 mg/dL (ref 8–23)
CO2: 27 mmol/L (ref 22–32)
Calcium: 9 mg/dL (ref 8.9–10.3)
Chloride: 104 mmol/L (ref 98–111)
Creatinine, Ser: 1.51 mg/dL — ABNORMAL HIGH (ref 0.44–1.00)
GFR, Estimated: 33 mL/min — ABNORMAL LOW (ref >60)
Glucose, Bld: 97 mg/dL (ref 70–99)
Potassium: 3.9 mmol/L (ref 3.5–5.1)
Sodium: 140 mmol/L (ref 135–145)

## 2020-05-10 LAB — HEMOGLOBIN AND HEMATOCRIT, BLOOD
HCT: 29.9 % — ABNORMAL LOW (ref 36.0–46.0)
Hemoglobin: 9.1 g/dL — ABNORMAL LOW (ref 12.0–15.0)

## 2020-05-10 MED ORDER — METOPROLOL TARTRATE 25 MG PO TABS
25.0000 mg | ORAL_TABLET | Freq: Every day | ORAL | Status: DC
  Administered 2020-05-10 – 2020-05-12 (×3): 25 mg via ORAL
  Filled 2020-05-10 (×3): qty 1

## 2020-05-10 MED ORDER — ACETAMINOPHEN 325 MG PO TABS
325.0000 mg | ORAL_TABLET | Freq: Every day | ORAL | Status: DC | PRN
  Administered 2020-05-10 – 2020-05-11 (×2): 650 mg via ORAL
  Filled 2020-05-10 (×2): qty 2

## 2020-05-10 MED ORDER — ALBUTEROL SULFATE HFA 108 (90 BASE) MCG/ACT IN AERS: 1.0000 | INHALATION_SPRAY | Freq: Four times a day (QID) | RESPIRATORY_TRACT | Status: DC | PRN

## 2020-05-10 NOTE — Progress Notes (Signed)
Progress Note  Chief Complaint:    Rectal bleeding     ASSESSMENT / PLAN:    # Chronic, intermittent painless rectal bleeding, probably from cecal AVMs found on colonoscopy in 2016 --Negative EGD this admission.  --She has a umbilical hernia containing part of her transverse colon.This precluded passage of colonoscope beyond transverse colon in January 2020 and again on reattempt at colonoscopy this admission.   --Appreciate Surgery's input. Patient felt to be a high risk for surgery given co-morbidities.  --Doesn't think she had any bleeding with BM last night. No BMs / bleeding today.    # Acute on chronic anemia secondary to chronic, intermittent GI bleeding.  --She is s/p 2 u blood this admission for hgb of 6.3. Hgb rose appropriately to 8.4.   --Continue oral iron.        SUBJECTIVE:   No complaints today. Feels okay    OBJECTIVE:    Scheduled inpatient medications:  . allopurinol  100 mg Oral Daily  . isosorbide-hydrALAZINE  1 tablet Oral TID  . pantoprazole (PROTONIX) IV  40 mg Intravenous Q12H  . pravastatin  20 mg Oral q1800  . torsemide  20 mg Oral BID   Continuous inpatient infusions:  . lactated ringers 20 mL/hr at 05/09/20 1355   PRN inpatient medications: albuterol, ondansetron **OR** ondansetron (ZOFRAN) IV  Vital signs in last 24 hours: Temp:  [98.6 F (37 C)-99.4 F (37.4 C)] 99.4 F (37.4 C) (10/12 1340) Pulse Rate:  [66-77] 77 (10/12 1340) Resp:  [14-22] 16 (10/12 1340) BP: (139-186)/(42-57) 153/42 (10/12 1340) SpO2:  [99 %] 99 % (10/12 0948) Last BM Date: 05/09/20  Intake/Output Summary (Last 24 hours) at 05/10/2020 1425 Last data filed at 05/10/2020 1400 Gross per 24 hour  Intake 1658.92 ml  Output 800 ml  Net 858.92 ml     Physical Exam:  . General: Alert, female in NAD . Heart:  Regular rate, + murmur. . Pulmonary: Normal respiratory effort . Abdomen: Soft, nondistended, Nontender. Normal bowel sounds.  . Neurologic: Alert  and oriented . Psych: Pleasant. Cooperative.   Filed Weights   05/07/20 1034  Weight: 88.9 kg    Intake/Output from previous day: 10/11 0701 - 10/12 0700 In: 1598.9 [P.O.:660; I.V.:938.9] Out: 1300 [Urine:1300] Intake/Output this shift: Total I/O In: 360 [P.O.:360] Out: 400 [Urine:400]    Lab Results: Recent Labs    05/09/20 0403 05/09/20 1014 05/10/20 0529  WBC 9.0  --  9.2  HGB 8.9* 10.9* 8.4*  HCT 29.3* 35.3* 27.3*  PLT 205  --  197   BMET Recent Labs    05/08/20 0336 05/09/20 0403 05/10/20 0529  NA 139 140 140  K 3.8 3.9 3.9  CL 105 104 104  CO2 27 26 27   GLUCOSE 98 109* 97  BUN 31* 28* 19  CREATININE 1.35* 1.64* 1.51*  CALCIUM 8.8* 9.1 9.0   LFT Recent Labs    05/08/20 0336  PROT 6.0*  ALBUMIN 3.1*  AST 12*  ALT 9  ALKPHOS 45  BILITOT 1.0   PT/INR No results for input(s): LABPROT, INR in the last 72 hours. Hepatitis Panel No results for input(s): HEPBSAG, HCVAB, HEPAIGM, HEPBIGM in the last 72 hours.  No results found.    Active Problems:   Symptomatic anemia   AVM (arteriovenous malformation) of small bowel, acquired with hemorrhage   Acute GI bleeding   GI bleed     LOS: 2 days   Tye Savoy ,NP 05/10/2020, 2:25  PM

## 2020-05-10 NOTE — Progress Notes (Signed)
[facility] Progress Note  1 Day Post-Op  Subjective: CC:  NAEO. Denies abdominal pain, nausea, or vomiting. Requesting something solid to eat. Reports 2 BMs last night but unsure if there was any blood in her stool because it was dark when she had BMs.   AFVSS Hgb 8.4, stable  Objective: Vital signs in last 24 hours: Temp:  [97.6 F (36.4 C)-98.9 F (37.2 C)] 98.7 F (37.1 C) (10/12 0517) Pulse Rate:  [66-84] 72 (10/12 0517) Resp:  [14-20] 14 (10/12 0517) BP: (133-192)/(25-103) 146/50 (10/12 0517) SpO2:  [92 %-100 %] 99 % (10/12 0517) Last BM Date: 05/09/20  Intake/Output from previous day: 10/11 0701 - 10/12 0700 In: 1598.9 [P.O.:660; I.V.:938.9] Out: 1300 [Urine:1300] Intake/Output this shift: No intake/output data recorded.  PE: Gen:  Alert, NAD, pleasant Card:  Regular rate and rhythm Pulm:  Normal effort on [place] Abd: Soft, ND, umbilical hernia present  Skin: warm and dry, no rashes  Psych: A&Ox3   Lab Results:  Recent Labs    05/09/20 0403 05/09/20 0403 05/09/20 1014 05/10/20 0529  WBC 9.0  --   --  9.2  HGB 8.9*   < > 10.9* 8.4*  HCT 29.3*   < > 35.3* 27.3*  PLT 205  --   --  197   < > = values in this interval not displayed.   BMET Recent Labs    05/09/20 0403 05/10/20 0529  NA 140 140  K 3.9 3.9  CL 104 104  CO2 26 27  GLUCOSE 109* 97  BUN 28* 19  CREATININE 1.64* 1.51*  CALCIUM 9.1 9.0   PT/INR No results for input(s): LABPROT, INR in the last 72 hours. CMP     Component Value Date/Time   NA 140 05/10/2020 0529   NA 142 04/11/2016 1007   K 3.9 05/10/2020 0529   K 3.3 (L) 04/11/2016 1007   CL 104 05/10/2020 0529   CO2 27 05/10/2020 0529   CO2 31 (H) 04/11/2016 1007   GLUCOSE 97 05/10/2020 0529   GLUCOSE 78 04/11/2016 1007   BUN 19 05/10/2020 0529   BUN 24.4 04/11/2016 1007   CREATININE 1.51 (H) 05/10/2020 0529   CREATININE 1.36 (H) 02/04/2020 1443   CREATININE 1.1 04/11/2016 1007   CALCIUM 9.0 05/10/2020 0529    CALCIUM 9.8 04/11/2016 1007   PROT 6.0 (L) 05/08/2020 0336   PROT 6.9 04/11/2016 1007   PROT 7.5 04/11/2016 1007   ALBUMIN 3.1 (L) 05/08/2020 0336   ALBUMIN 3.0 (L) 04/11/2016 1007   AST 12 (L) 05/08/2020 0336   AST 12 04/11/2016 1007   ALT 9 05/08/2020 0336   ALT 12 04/11/2016 1007   ALKPHOS 45 05/08/2020 0336   ALKPHOS 82 04/11/2016 1007   BILITOT 1.0 05/08/2020 0336   BILITOT 0.35 04/11/2016 1007   GFRNONAA 33 (L) 05/10/2020 0529   GFRAA 30 (L) 02/19/2020 1359   Lipase     Component Value Date/Time   LIPASE 31.0 07/27/2010 1428    Studies/Results: No results found.  Anti-infectives: Anti-infectives (From admission, onward)   None     Assessment/Plan H/O Ulcerative colitis  HTN A fib - not on anticoagulation  Cor pulmonale Chronic combined CHF - last ECHO in March 2019 with EF 65-70% PVD COPD/O2 dependence HLD CKD stage III MGUS OSA Obesity- BMI 38.28 Gout   Acute on chronic anemia - s/p 2 units PRBC 10/9, hgb 8.4 this AM from 8.9, continue to monitor  GI bleeding - bloody stools  on admission, unsure if still bloody.  - colonoscopy unable to be completed today secondary to umbilical hernia  - hgb/hct currently stable s/p 2 u pRBC on admission  Chronically incarcerated umbilical hernia - Patient w/ recurrent GIB unable to be fully assessed due to hernia. Surgical intervention would be high risk given medical co-morbidities. High risk of hernia recurrence given BMI 38. - As patient is hemodynamically stable, I do not recommend emergent surgical intervention at this time. Surgery will continue to follow.  - ok to advance as tolerated from surgical standpoint  FEN: CLD VTE: no chemical prophylaxis in setting of Acute on chronic anemia from GI bleeding  ID: no current abx   LOS: 2 days    Obie Dredge, [facility] Please see Amion for pager number during day hours 7:00am-4:30pm

## 2020-05-10 NOTE — Progress Notes (Addendum)
PROGRESS NOTE  LAQUASHIA MERGENTHALER SWN:[id number] DOB: 02-29-1941 DOA: 05/07/2020 PCP: [name], MD   LOS: 2 days   Brief narrative: As per HPI,  [name] is a 79 y.o. female with medical history significant of recurrent GIB, HTN, combined HF on home oxygen, presented to the hospital  with 1 week of rectal bleeding with increasing fatigue, lightheadedness.  In the ED, patient was noted to be FOBT positive and hemoglobin of 6.3.  Patient was then admitted to hospital for GI bleed.    Assessment/Plan:  Principal Problem:   Symptomatic anemia Active Problems:   CKD (chronic kidney disease), stage III (HCC)   Chronic venous stasis dermatitis of both lower extremities   Anemia, iron deficiency   Cor pulmonale (HCC)   COLD (chronic obstructive lung disease) (HCC)   CHF (congestive heart failure), NYHA class III, chronic, diastolic (HCC)   Atrial fibrillation (HCC)   AVM (arteriovenous malformation) of small bowel, acquired with hemorrhage   Acute GI bleeding   GI bleed  GIB, iron deficiency/ symptomatic anemia Patient has had a history of recurrent GI bleed in the past.  History of AVM of stomach small bowel and colon.  Chronic IV iron therapy for iron deficiency anemia.  Hemoglobin of 6.3 on presentation.  Hemoglobin of 9.1 in July 2021. Status post  2 units of packed RBCs. GI on board.  Continue Protonix.  Status post EGD on 05/08/2020 with findings of few angiodysplasia in the duodenum nonbleeding and underwent APC treatment. Patient underwent colonoscopy on 05/09/2020 which was incomplete due to presence of umbilical hernia. There was note of moderate diverticulosis in the left colon. Could not advance beyond the transverse colon. There was severe stenosis at the site of periumbilical hernia. Patient received surgical consultation. Surgery has seen the patient in recommend no surgical intervention due to high surgical risk. Hemoglobin on 05/09/2020 was 10.9 hemoglobin today at  8.4. Expectant management ongoing for ongoing GI bleed. Will transfuse for hemoglobin less than 7. Seen by cardiology for potential surgical intervention and clearance  Gout Continue allopurinol  Essential HTN. continue Bidil, torsemide. Resume metoprolol from home since blood pressure appears to be elevated  Chronic combined systolic/diastolic HF, COPD, cor pulmonale Continue BiDil, torsemide.  Beta-blocker was stopped in the past due to bradycardia but blood pressure seems to be okay at this time and I see medication reconciliation with low pressure in it. Will resume and closely monitor.  Atrial fibrillation.  Not on anticoagulation due to recurrent GI bleed.  Currently rate controlled. Heart rate is okay at this time could resume metoprolol from home.  Hyperlipidemia On statins   Chronic hypoxic respiratory failure   On oxygen at 2L/min by nasal cannula at baseline.  [id number] Patient with baseline creatinine of 1.2-1.4.  Creatinine of 1.5 today.  Monitor BMP.  DVT prophylaxis: SCDs Start: 05/07/20 1401   Code Status: Partial  Family Communication: Spoke with the patient's spouse on the phone and updated him about the clinical condition of the patient.  Status is: Inpatient  The patient is inpatient because: IV treatments appropriate due to intensity of illness or inability to take PO, Inpatient level of care appropriate due to severity of illness and ongoing GI bleed,   Dispo: The patient is from: Home              Anticipated d/c is to: Home               Anticipated d/c date is: 2  days, follow GI recommendations              Patient currently is not medically stable to d/c.  Consultants:  GI  Cardiology  General surgery  Procedures:  EGD on 05/08/2020  incomplete colonoscopy on 05/10/2019   Antibiotics:  . None  Anti-infectives (From admission, onward)   None     Subjective: Today, patient was seen and examined at bedside. Denies any nausea,  vomiting abdominal pain but has been having black stools, last bowel movement yesterday.. Drop in hemoglobin has been noted  Objective: Vitals:   05/10/20 0948 05/10/20 1340  BP: (!) 186/57 (!) 153/42  Pulse: 71 77  Resp: (!) 22 16  Temp: 98.6 F (37 C) 99.4 F (37.4 C)  SpO2: 99%     Intake/Output Summary (Last 24 hours) at 05/10/2020 1350 Last data filed at 05/10/2020 1000 Gross per 24 hour  Intake 1538.92 ml  Output 400 ml  Net 1138.92 ml   Filed Weights   05/07/20 1034  Weight: 88.9 kg   Body mass index is 38.28 kg/m.   Physical Exam: General: Obese built, not in obvious distress, on nasal cannula oxygen at 2 L/min HENT:   Mild pallor noted.. Oral mucosa is moist.  Chest:   Diminished breath sounds bilaterally.  CVS: S1 &S2 heard. No murmur.  Regular rate and rhythm. Abdomen: Soft, umbilical hernia noted with tenderness on palpation, nondistended.  Bowel sounds are heard.    Extremities: No cyanosis, clubbing with trace edema, peripheral pulses are palpable. Psych: Alert, awake and oriented, normal mood CNS:  No cranial nerve deficits.  Power equal in all extremities.   Skin: Warm and dry. Lower extremities with venous dermatitis.  Data Review: I have personally reviewed the following laboratory data and studies,  CBC: Recent Labs  Lab 05/07/20 1036 05/07/20 2200 05/08/20 1649 05/08/20 2230 05/09/20 0403 05/09/20 1014 05/10/20 0529  WBC 10.2  --   --   --  9.0  --  9.2  HGB 6.3*   < > 8.9* 9.4* 8.9* 10.9* 8.4*  HCT 20.6*   < > 28.4* 29.5* 29.3* 35.3* 27.3*  MCV 88.0  --   --   --  91.6  --  91.0  PLT 251  --   --   --  205  --  197   < > = values in this interval not displayed.   Basic Metabolic Panel: Recent Labs  Lab 05/07/20 1036 05/08/20 0336 05/09/20 0403 05/10/20 0529  NA 137 139 140 140  K 3.6 3.8 3.9 3.9  CL 103 105 104 104  CO2 26 27 26 27   GLUCOSE 140* 98 109* 97  BUN 39* 31* 28* 19  CREATININE 1.55* 1.35* 1.64* 1.51*  CALCIUM 8.7*  8.8* 9.1 9.0   Liver Function Tests: Recent Labs  Lab 05/07/20 1036 05/08/20 0336  AST 13* 12*  ALT 10 9  ALKPHOS 51 45  BILITOT 0.4 1.0  PROT 6.6 6.0*  ALBUMIN 3.4* 3.1*   No results for input(s): LIPASE, AMYLASE in the last 168 hours. No results for input(s): AMMONIA in the last 168 hours. Cardiac Enzymes: No results for input(s): CKTOTAL, CKMB, CKMBINDEX, TROPONINI in the last 168 hours. BNP (last 3 results) No results for input(s): BNP in the last 8760 hours.  ProBNP (last 3 results) No results for input(s): PROBNP in the last 8760 hours.  CBG: No results for input(s): GLUCAP in the last 168 hours. Recent Results (from the past 240  hour(s))  Respiratory Panel by RT PCR (Flu A&B, Covid) - Nasopharyngeal Swab     Status: None   Collection Time: 05/07/20 11:25 AM   Specimen: Nasopharyngeal Swab  Result Value Ref Range Status   SARS Coronavirus 2 by RT PCR NEGATIVE NEGATIVE Final    Comment: (NOTE) SARS-CoV-2 target nucleic acids are NOT DETECTED.  The SARS-CoV-2 RNA is generally detectable in upper respiratoy specimens during the acute phase of infection. The lowest concentration of SARS-CoV-2 viral copies this assay can detect is 131 copies/mL. A negative result does not preclude SARS-Cov-2 infection and should not be used as the sole basis for treatment or other patient management decisions. A negative result may occur with  improper specimen collection/handling, submission of specimen other than nasopharyngeal swab, presence of viral mutation(s) within the areas targeted by this assay, and inadequate number of viral copies (<131 copies/mL). A negative result must be combined with clinical observations, patient history, and epidemiological information. The expected result is Negative.  Fact Sheet for Patients:  PinkCheek.be  Fact Sheet for Healthcare Providers:  GravelBags.it  This test is no t yet  approved or cleared by the [place] FDA and  has been authorized for detection and/or diagnosis of SARS-CoV-2 by FDA under an Emergency Use Authorization (EUA). This EUA will remain  in effect (meaning this test can be used) for the duration of the COVID-19 declaration under Section 564(b)(1) of the Act, 21 U.S.C. section 360bbb-3(b)(1), unless the authorization is terminated or revoked sooner.     Influenza A by PCR NEGATIVE NEGATIVE Final   Influenza B by PCR NEGATIVE NEGATIVE Final    Comment: (NOTE) The Xpert Xpress SARS-CoV-2/FLU/RSV assay is intended as an aid in  the diagnosis of influenza from Nasopharyngeal swab specimens and  should not be used as a sole basis for treatment. Nasal washings and  aspirates are unacceptable for Xpert Xpress SARS-CoV-2/FLU/RSV  testing.  Fact Sheet for Patients: PinkCheek.be  Fact Sheet for Healthcare Providers: GravelBags.it  This test is not yet approved or cleared by the [place] FDA and  has been authorized for detection and/or diagnosis of SARS-CoV-2 by  FDA under an Emergency Use Authorization (EUA). This EUA will remain  in effect (meaning this test can be used) for the duration of the  Covid-19 declaration under Section 564(b)(1) of the Act, 21  U.S.C. section 360bbb-3(b)(1), unless the authorization is  terminated or revoked. Performed at [facility], [place] [address], [place], [place] [address]      Studies: No results found.    Flora Lipps, MD  [facility] 05/10/2020

## 2020-05-10 NOTE — CV Procedure (Signed)
Echocardiogram not completed per patients request. Patient is under the assumption that she won't be going to surgery and that the echo will not help contribute to her underlying condition.   [name] RDCS

## 2020-05-10 NOTE — Consult Note (Addendum)
Cardiology Consultation:   Patient ID: [name] MRN: [id number]; DOB: 07-02-1941  Admit date: 05/07/2020 Date of Consult: 05/10/2020  Primary Care Provider: [name], MD [facility] Cardiologist: Laurie Grooms, MD  [facility] Electrophysiologist:  None  [name]    Patient Profile:   [name] is a 79 y.o. female with a hx of chronic SOB, OSA without CPAP, P. atrial fib-no anticoagulation due to GI bleeding due to AVMs, COPD, HTN, sinus brady, CKD 3,  and chronic diastolic dysfunction. .  who is being seen today for the evaluation of pre-op eval for hernia repair  at the request of Dr. Louanne [name].  History of Present Illness:   [name] with cardiac hx of PAF maintaining SR not on anticoagulation due to GI bleeding -AVMs, chronic SOB, chronic diastolic HF, and hx of neg nuc study 03/2010.   Also with HTN, CKD 3.  She has hx of bradycardia and followed by Dr. Rayann [name].  HR usually in the 50s.  Last echo 09/2017 with EF 65-70% no RWMA, mild MR.  She is 02 dependent with her COPD.   Pt admitted 05/07/20 with rectal bleeding had increased. She has outpt Iron infusions.  She has been transfused 2 units RBC.  EGD with diminutive duodenal AVMs ablated.   Unable to fully assess transverse colon with colonoscopy due to chronically incarcerated umbilical hernia, but patient has had bleeding from cecal AVMs in the past.  Surgery has seen and requests pulmonary and cardiac consults to eval for surgery to repair chronically incarcerated umbilical hernia, most likely need mesh implantation.      She does housework and she cares for her oldest daughter who has physical issues.  She vacuums on occ + SOB and with mopping + SOB.  She cooks her meals without issue. She may have brief shooting chest pain lasting about half a second.  No chest pain that lasts longer than that.      EKG:  The EKG was personally reviewed and demonstrates:  No EKG have ordered Telemetry:  Telemetry was  personally reviewed and demonstrates:  No tele   Na 140, K+ 3.9, Cr 1.51 GFR 33 Hgb 8.4 WBC 9.2 plts 197   covid neg  BP 146/50 P 72 R 14 afebrile sp02 99% on 2 L.   Past Medical History:  Diagnosis Date   Allergy    Angiodysplasia of cecum    Angiodysplasia of duodenum    Arthritis    Asteatotic eczema 02/25/2016   Asthma    Atrial fibrillation ([place]) 10/17/2017   Blood transfusion without reported diagnosis    Cataract    Chest pain at rest 03/22/2010   Qualifier: Diagnosis of  By: Ronnald Ramp MD, Arvid Right.    CHF (congestive heart failure) ([place]) 08/31/03   CHF (congestive heart failure), NYHA class III, chronic, combined ([place]) 08/10/2017   Chronic venous stasis dermatitis of both lower extremities 06/02/2013   CKD (chronic kidney disease), stage III ([place]) 06/20/2010   Estimated Creatinine Clearance: 34.8 mL/min (A) (by C-G formula based on SCr of 1.37 mg/dL (H)).    COLD (chronic obstructive lung disease) ([place])    Colonic polyp 02/16/2008   Tubular adenoma   COPD (chronic obstructive pulmonary disease) ([place]) 01/27/98   Cor pulmonale ([place]) 11/16/2014   D-dimer, elevated 01/01/2018   GERD 07/30/1992   Gout    Heart murmur    HTN (hypertension) 07/30/1988   Hyperlipidemia 11/27/96   Hyperlipidemia with target LDL less than 100  11/27/1996        Kidney stone 1960, 1972, 1991   MGUS (monoclonal gammopathy of unknown significance) 11/08/2016   Morbid obesity ([place]) 04/19/2010   She agrees to work on her lifestyle modifications to help her lose weight.    Neck pain on right side 01/01/2018   OSA (obstructive sleep apnea) 06/22/2013   Osteopenia, senile 02/05/2013   July 2014  -2.1 left femur -1.9 left forearm    Other screening mammogram 02/05/2013   Oxygen deficiency    Prediabetes 11/13/2006   Renal insufficiency    Stenosis of cervical spine with myelopathy ([place]) 01/01/2018   Ulcer of leg, chronic, right ([place]) 10/10/2011   Ulcerative colitis    ULCERATIVE COLITIS-LEFT SIDE 03/19/2008        Vitamin  B12 deficiency anemia 11/13/2006        Vitamin D deficiency 02/06/2013    Past Surgical History:  Procedure Laterality Date   BRONCHOSCOPY     COLONOSCOPY     COLONOSCOPY WITH PROPOFOL N/A 02/23/2015   Procedure: COLONOSCOPY WITH PROPOFOL;  Surgeon: Ladene Artist, MD;  Location: [facility];  Service: Endoscopy;  Laterality: N/A;   COLONOSCOPY WITH PROPOFOL N/A 08/12/2018   Procedure: COLONOSCOPY WITH PROPOFOL;  Surgeon: Ladene Artist, MD;  Location: [facility];  Service: Endoscopy;  Laterality: N/A;   ESOPHAGOGASTRODUODENOSCOPY (EGD) WITH PROPOFOL N/A 02/23/2015   Procedure: ESOPHAGOGASTRODUODENOSCOPY (EGD) WITH PROPOFOL;  Surgeon: Ladene Artist, MD;  Location: [facility];  Service: Endoscopy;  Laterality: N/A;   ESOPHAGOGASTRODUODENOSCOPY (EGD) WITH PROPOFOL N/A 08/12/2018   Procedure: ESOPHAGOGASTRODUODENOSCOPY (EGD) WITH PROPOFOL;  Surgeon: Ladene Artist, MD;  Location: [facility];  Service: Endoscopy;  Laterality: N/A;   ESOPHAGOGASTRODUODENOSCOPY (EGD) WITH PROPOFOL N/A 05/08/2020   Procedure: ESOPHAGOGASTRODUODENOSCOPY (EGD) WITH PROPOFOL;  Surgeon: Yetta Flock, MD;  Location: [facility];  Service: Gastroenterology;  Laterality: N/A;   HOT HEMOSTASIS N/A 08/12/2018   Procedure: HOT HEMOSTASIS (ARGON PLASMA COAGULATION/BICAP);  Surgeon: Ladene Artist, MD;  Location: [facility];  Service: Endoscopy;  Laterality: N/A;  EGD and Colon APC   HOT HEMOSTASIS N/A 05/08/2020   Procedure: HOT HEMOSTASIS (ARGON PLASMA COAGULATION/BICAP);  Surgeon: Yetta Flock, MD;  Location: [facility];  Service: Gastroenterology;  Laterality: N/A;   NO PAST SURGERIES     POLYPECTOMY       Home Medications:  Prior to Admission medications   Medication Sig Start Date End Date Taking? Authorizing Provider  acetaminophen (TYLENOL) 325 MG tablet Take 325-650 mg by mouth daily as needed for moderate pain, fever or headache.    Yes [provider]  albuterol (PROAIR  HFA) 108 (90 Base) MCG/ACT inhaler Inhale 1-2 puffs into the lungs every 6 (six) hours as needed for wheezing or shortness of breath. 10/30/16  Yes Laurie Lima, MD  allopurinol (ZYLOPRIM) 100 MG tablet TAKE 1 TABLET BY MOUTH EVERY DAY FOR GOUT Patient taking differently: Take 100 mg by mouth daily.  12/02/19  Yes Laurie Lima, MD  BIDIL 20-37.5 MG tablet TAKE 1 TABLET BY MOUTH THREE TIMES DAILY Patient taking differently: Take 1 tablet by mouth 3 (three) times daily.  11/11/19  Yes Laurie Lima, MD  ferrous sulfate 325 (65 FE) MG tablet Take 1 tablet (325 mg total) by mouth 2 (two) times daily with a meal. 11/13/19  Yes Laurie Lima, MD  metoprolol tartrate (LOPRESSOR) 25 MG tablet Take 25 mg by mouth daily. 03/03/20  Yes [provider]  Pitavastatin Calcium 1  MG TABS Take 1 tablet (1 mg total) by mouth daily. 02/05/20  Yes Laurie Lima, MD  torsemide (DEMADEX) 20 MG tablet TAKE 1 TABLET(20 MG) BY MOUTH TWICE DAILY Patient taking differently: Take 20 mg by mouth 2 (two) times daily.  04/25/20  Yes Laurie Lima, MD  allopurinol (ZYLOPRIM) 100 MG tablet One po QD 06/09/19   Laurie Lima, MD  fluticasone furoate-vilanterol (BREO ELLIPTA) 100-25 MCG/INH AEPB INHALE 1 PUFF INTO THE LUNGS DAILY 04/23/19   Laurie Lima, MD  torsemide (DEMADEX) 20 MG tablet TAKE 1 TABLET(20 MG) BY MOUTH TWICE DAILY 08/03/19   Laurie Lima, MD  torsemide (DEMADEX) 20 MG tablet TAKE 1 TABLET(20 MG) BY MOUTH TWICE DAILY 10/25/19   Laurie Lima, MD    Inpatient Medications: Scheduled Meds:  allopurinol  100 mg Oral Daily   isosorbide-hydrALAZINE  1 tablet Oral TID   pantoprazole (PROTONIX) IV  40 mg Intravenous Q12H   pravastatin  20 mg Oral q1800   torsemide  20 mg Oral BID   Continuous Infusions:  lactated ringers 20 mL/hr at 05/09/20 1355   PRN Meds: albuterol, ondansetron **OR** ondansetron (ZOFRAN) IV  Allergies:    Allergies  Allergen Reactions   Amoxicillin Diarrhea    DID THE  REACTION INVOLVE: Swelling of the face/tongue/throat, SOB, or low BP? N Sudden or severe rash/hives, skin peeling, or the inside of the mouth or nose? N Did it require medical treatment? N When did it last happen?      MORE THAN 10 YEARS If all above answers are "NO", may proceed with cephalosporin use.    Celebrex [Celecoxib]     edema   Enalapril Cough   Lipitor [Atorvastatin]     Muscle aches   Amlodipine Besylate     REACTION: edema   Codeine Sulfate     REACTION: Nausea   Hydrocodone-Acetaminophen     REACTION: Nausea    Social History:   Social History   Socioeconomic History   Marital status: Married    Spouse name: Not on file   Number of children: 3   Years of education: Not on file   Highest education level: Not on file  Occupational History   Occupation: Surveyor, quantity: [employer]  Tobacco Use   Smoking status: Former Smoker    Quit date: 06/01/1996    Years since quitting: 23.9   Smokeless tobacco: Never Used   Tobacco comment: Quit 1998  Vaping Use   Vaping Use: Never used  Substance and Sexual Activity   Alcohol use: No   Drug use: No   Sexual activity: Not Currently  Other Topics Concern   Not on file  Social History Narrative   Daily Caffeine Use:  4   Regular Exercise -  NO      Social Determinants of Health   Financial Resource Strain:    Difficulty of Paying Living Expenses: Not on file  Food Insecurity:    Worried About Charity fundraiser in the Last Year: Not on file   [employer] of Food in the Last Year: Not on file  Transportation Needs:    Lack of Transportation (Medical): Not on file   Lack of Transportation (Non-Medical): Not on file  Physical Activity:    Days of Exercise per Week: Not on file   Minutes of Exercise per Session: Not on file  Stress:    Feeling of Stress : Not on file  Social Connections:  Frequency of Communication with Friends and Family: Not on file   Frequency of Social Gatherings with Friends and Family:  Not on file   Attends Religious Services: Not on file   Active Member of Clubs or Organizations: Not on file   Attends Archivist Meetings: Not on file   Marital Status: Not on file  Intimate Partner Violence:    Fear of Current or Ex-Partner: Not on file   Emotionally Abused: Not on file   Physically Abused: Not on file   Sexually Abused: Not on file    Family History:    Family History  Problem Relation Age of Onset   Stroke Mother    Diabetes Mother    Kidney cancer Mother    Stomach cancer Mother    Heart disease Mother    Stroke Father    Heart disease Brother    Heart disease Brother    Crohn's disease Brother    Heart disease Sister        CATH,STENT   Clotting disorder Sister    Cervical cancer Sister    Lung cancer Sister    Heart attack Sister    Diabetes Sister    Colon cancer Neg Hx    Esophageal cancer Neg Hx    Rectal cancer Neg Hx      ROS:  Please see the history of present illness.  General:no colds or fevers, no weight changes Skin:no rashes or ulcers HEENT:no blurred vision, no congestion CV:see HPI PUL:see HPI GI:no diarrhea constipation + melena, no indigestion GU:no hematuria, no dysuria MS:no joint pain, no claudication Neuro:no syncope, no lightheadedness Endo:no diabetes, no thyroid disease  All other ROS reviewed and negative.     Physical Exam/Data:   Vitals:   05/09/20 1310 05/09/20 1328 05/09/20 2129 05/10/20 0517  BP: (!) 180/50 (!) 178/53 (!) 139/46 (!) 146/50  Pulse: 84 73 66 72  Resp: 18 15 18 14   Temp:  98.6 F (37 C) 98.6 F (37 C) 98.7 F (37.1 C)  TempSrc:  Oral Oral Oral  SpO2: 100% 100% 99% 99%  Weight:      Height:        Intake/Output Summary (Last 24 hours) at 05/10/2020 0834 Last data filed at 05/10/2020 0600 Gross per 24 hour  Intake 1598.92 ml  Output 1300 ml  Net 298.92 ml   Last 3 Weights 05/07/2020 02/19/2020 02/15/2020  Weight (lbs) 196 lb 205 lb 206 lb 12.8 oz  Weight (kg) 88.905 kg  92.987 kg 93.804 kg     Body mass index is 38.28 kg/m.  General:  Well nourished, well developed, in no acute distress HEENT: normal Lymph: no adenopathy Neck: no JVD Endocrine:  No thryomegaly Vascular: No carotid bruits; pedal pulses 1+ bilaterally  Cardiac:  normal S1, S2; RRR; 2/6 systolic murmur no gallup rub or click Lungs:  Clear though diminshed to auscultation bilaterally, no wheezing, rhonchi or rales  Abd: obese, soft, nontender, no hepatomegaly  Ext: 1-2+ edema lower ext, brawny color of shinns Musculoskeletal:  No deformities, BUE and BLE strength normal and equal Skin: warm and dry  Neuro:  Alert and oriented X 3 MAE follows commandsno focal abnormalities noted Psych:  Normal affect    Relevant CV Studies: Echo 10/18/17 Study Conclusions   - Left ventricle: The cavity size was normal. There was moderate    concentric hypertrophy. Systolic function was vigorous. The    estimated ejection fraction was in the range of 65% to 70%.  Wall    motion was normal; there were no regional wall motion    abnormalities. The study is not technically sufficient to allow    evaluation of LV diastolic function.  - Aortic valve: Trileaflet; mildly thickened, mildly calcified    leaflets.  - Mitral valve: There was mild regurgitation.   Laboratory Data:  High Sensitivity Troponin:  No results for input(s): TROPONINIHS in the last 720 hours.   Chemistry Recent Labs  Lab 05/08/20 0336 05/09/20 0403 05/10/20 0529  NA 139 140 140  K 3.8 3.9 3.9  CL 105 104 104  CO2 27 26 27   GLUCOSE 98 109* 97  BUN 31* 28* 19  CREATININE 1.35* 1.64* 1.51*  CALCIUM 8.8* 9.1 9.0  GFRNONAA 37* 29* 33*  ANIONGAP 7 10 9     Recent Labs  Lab 05/07/20 1036 05/08/20 0336  PROT 6.6 6.0*  ALBUMIN 3.4* 3.1*  AST 13* 12*  ALT 10 9  ALKPHOS 51 45  BILITOT 0.4 1.0   Hematology Recent Labs  Lab 05/07/20 1036 05/07/20 2200 05/09/20 0403 05/09/20 1014 05/10/20 0529  WBC 10.2  --  9.0  --   9.2  RBC 2.34*  --  3.20*  --  3.00*  HGB 6.3*   < > 8.9* 10.9* 8.4*  HCT 20.6*   < > 29.3* 35.3* 27.3*  MCV 88.0  --  91.6  --  91.0  MCH 26.9  --  27.8  --  28.0  MCHC 30.6  --  30.4  --  30.8  RDW 14.4  --  14.6  --  14.7  PLT 251  --  205  --  197   < > = values in this interval not displayed.   BNPNo results for input(s): BNP, PROBNP in the last 168 hours.  DDimer No results for input(s): DDIMER in the last 168 hours.   Radiology/Studies:  No results found.   Assessment and Plan:   Pre-op eval no known CAD, hx diastolic dysfunction with chronic diastolic HF, PAF maintaining SR not on anticoagulation due to GI bleeding.  HTN has been controlled. And hx of Sinus brady but has been stable on current meds now off BB.   Surgery is higher risk and pt from cardiology perspective is acceptable risk with 6.6% risk of major cardiac event.   It has been 2 years since echo will defer to Dr. Johnsie Cancel if we should repeat.  EKG pending. (on CTA of chest in 2019 with mild coronary calcifications) PAF will check EKG but has been maintaining SR- she usually has episode about once per year per pt.   Sinus brady with on last visit HR mostly in 50s.  Here seem to be 60s to 24s. Her metoprolol was stopped in July.   HTN stable Morbid obesity understands her health may improve with wt loss COPD with home 02 and chronic SOB.                 For questions or updates, please contact [place] Please consult [email or web address] for contact info under    Signed, Cecilie Kicks, NP  05/10/2020 8:34 AM  Patient examined chart reviewed discussed care with patient and NP Obese white female clear lungs 3/6 SEM mild AS. Large only partially reducible umbilical hernia trace edema obese with palpable pedal pulses.  ECG in NSR From a cardiac perspective she is not prohibitive for hernia surgery. Functional status > 5 METS, no active CAD and normal EF. Rhythm currently stable.  Clear to proceed from cardiac  perspective  My understanding is that surgery is hesitant to operate due to multiple co morbidities and less likely success of surgery due to obesity but she has had this hernia a long time and it is not fully reducible and it is painful so I suspect it will need to be addressed surgically at some point   Jenkins Rouge MD [facility] [place]

## 2020-05-11 ENCOUNTER — Inpatient Hospital Stay (HOSPITAL_COMMUNITY): Payer: Medicare Other

## 2020-05-11 ENCOUNTER — Encounter (HOSPITAL_COMMUNITY): Payer: Self-pay | Admitting: Gastroenterology

## 2020-05-11 DIAGNOSIS — R011 Cardiac murmur, unspecified: Secondary | ICD-10-CM

## 2020-05-11 DIAGNOSIS — Z01810 Encounter for preprocedural cardiovascular examination: Secondary | ICD-10-CM | POA: Diagnosis not present

## 2020-05-11 DIAGNOSIS — I480 Paroxysmal atrial fibrillation: Secondary | ICD-10-CM | POA: Diagnosis not present

## 2020-05-11 LAB — BASIC METABOLIC PANEL
Anion gap: 7 (ref 5–15)
BUN: 19 mg/dL (ref 8–23)
CO2: 26 mmol/L (ref 22–32)
Calcium: 8.6 mg/dL — ABNORMAL LOW (ref 8.9–10.3)
Chloride: 103 mmol/L (ref 98–111)
Creatinine, Ser: 1.64 mg/dL — ABNORMAL HIGH (ref 0.44–1.00)
GFR, Estimated: 29 mL/min — ABNORMAL LOW (ref >60)
Glucose, Bld: 93 mg/dL (ref 70–99)
Potassium: 3.6 mmol/L (ref 3.5–5.1)
Sodium: 136 mmol/L (ref 135–145)

## 2020-05-11 LAB — HEMOGLOBIN AND HEMATOCRIT, BLOOD
HCT: 26.7 % — ABNORMAL LOW (ref 36.0–46.0)
HCT: 27.3 % — ABNORMAL LOW (ref 36.0–46.0)
Hemoglobin: 8.4 g/dL — ABNORMAL LOW (ref 12.0–15.0)
Hemoglobin: 8.9 g/dL — ABNORMAL LOW (ref 12.0–15.0)

## 2020-05-11 LAB — ECHOCARDIOGRAM COMPLETE
Area-P 1/2: 2.22 cm2
Height: 60 in
S' Lateral: 2.3 cm
Weight: 3136 oz

## 2020-05-11 NOTE — Assessment & Plan Note (Signed)
-  On 2 L nasal cannula chronically -Patient has a lot of concern about being intubated for potential surgery and coming off of the ventilator given her underlying lung disease

## 2020-05-11 NOTE — Assessment & Plan Note (Addendum)
-   no s/s exacerbation at this time - last echo 2019 reviewed; EF 60-65%, Gr 2 DD -Repeated this hospitalization showing similar EF, 60 to 65% and grade 2 diastolic dysfunction -Cardiology following, appreciate assistance -She was cleared from a cardiovascular standpoint.  If readmitted in the future, reconsult cardiology for repeat clearance/reevaluation

## 2020-05-11 NOTE — Assessment & Plan Note (Addendum)
-   not on anticoagulation due to history of AVMs and GIBs - continue metoprolol

## 2020-05-11 NOTE — Progress Notes (Addendum)
Progress Note  Patient Name: [name] Date of Encounter: 05/11/2020  Primary Cardiologist: Larae Grooms, MD   Subjective   Doing well this AM. No specific complaints.   Inpatient Medications    Scheduled Meds:  allopurinol  100 mg Oral Daily   isosorbide-hydrALAZINE  1 tablet Oral TID   metoprolol tartrate  25 mg Oral Daily   pantoprazole (PROTONIX) IV  40 mg Intravenous Q12H   pravastatin  20 mg Oral q1800   torsemide  20 mg Oral BID   Continuous Infusions:  lactated ringers 20 mL/hr at 05/11/20 0400   PRN Meds: acetaminophen, albuterol, albuterol, ondansetron **OR** ondansetron (ZOFRAN) IV   Vital Signs    Vitals:   05/10/20 1340 05/10/20 1725 05/10/20 2106 05/11/20 0513  BP: (!) 153/42 (!) 157/48 (!) 120/37 (!) 151/46  Pulse: 77 75 72 66  Resp: 16 18 16 16   Temp: 99.4 F (37.4 C) 98.7 F (37.1 C) 99.3 F (37.4 C) 98.1 F (36.7 C)  TempSrc: Oral Oral Oral Oral  SpO2:  99% 98% 99%  Weight:      Height:        Intake/Output Summary (Last 24 hours) at 05/11/2020 0738 Last data filed at 05/11/2020 0400 Gross per 24 hour  Intake 979.94 ml  Output 400 ml  Net 579.94 ml   Filed Weights   05/07/20 1034  Weight: 88.9 kg    Physical Exam   General: Overweight, NAD Neck: Negative for carotid bruits. No JVD Lungs:Clear to ausculation bilaterally. Breathing is unlabored. Cardiovascular: RRR with S1 S2. + murmurs Abdomen: Soft, non-tender, non-distended. No obvious abdominal masses. Extremities: 1+ BLE edema. Radial pulses 2+ bilaterally Neuro: Alert and oriented. No focal deficits. No facial asymmetry. MAE spontaneously. Psych: Responds to questions appropriately with normal affect.    Patient Profile     79 y.o. female with a hx of chronic SOB, OSA without CPAP, PAF not on AC due to GI bleeding due to AVMs, COPD, HTN, SB, CKD Stage III,  and chronic diastolic dysfunction who is being seen today for the evaluation of pre-op eval for  hernia repair.  Assessment & Plan    1. Pre-operative evaluation for hernia repair: -Pt seen by cardiology yesterday for pre-operative clearance prior to hernia repair surgery however surgery fells that she is high risk. In the absence of repeat transfusions, surgery would prefer to hold off for now given higher risk of complication per chart review -Cleared from a CV standpoint however still felt to be higher risk due to complicated co-morbid conditions -Plan for echocardiogram today   2. GI Bleed: -Pt presented with acute GI bleeding requiring multiple transfusions>>presumed cecal angioectasis which cannot be reached due to large umbilical hernia. Recommendations are for surgical repair if further bleeding -GI following   3. PAF: -EKG with NSR with 1st AV block>>no AF  -No AC due to recurrent GI bleeding requiring PRBCs  -Hb, 8.4 today>>down from 9.1 yesterday   4. Sinus bradycardia: -HR, 70's  -Beta blocker stopped in July 2021  5. HTN: -Stable, 151/46>>120/37>>157/48 -Continue current regimen   6. COPD: -On supplemental home O2 with chronic SOB   7. CKD stage III: -Baseline creatinine appears to be in the 1.5 range more recently -Creatinine today up to 1.64 from 1.51 yesterday    Signed, Kathyrn Drown NP-C Reader Pager: [phone] 05/11/2020, 7:38 AM     Patient has no cardiac complaints. Maint NSR not on anticoagulation due to  GI bleed Chronic  abdominal hernia pain no change on exam Surgery and GI following See not from yesterday from cardiac perspective not prohibitive to proceed with ventral hernia repair under general anesthesia.   Jenkins Rouge MD [facility]  For questions or updates, please contact   Please consult [email or web address] for contact info under Cardiology/STEMI.

## 2020-05-11 NOTE — Assessment & Plan Note (Signed)
-   see GIB

## 2020-05-11 NOTE — Assessment & Plan Note (Signed)
-  Continue current regimen in place

## 2020-05-11 NOTE — Assessment & Plan Note (Addendum)
-   long standing history of recurrent GIB. History of cecal AVMs on CLN on 4301; now with umbilical hernia, unable to evaluate colon past transverse colon on CLN - now s/p 2 units PRBC for admission - no further rectal bleeding reported; Hgb still labile - continue trending - further plans regarding hernia repair vs conservative management are ongoing; appreciate GI, cardiology, and surgery input on plan of care  -No further bleeding and hemoglobin stable at 9.1 g/dL on day of discharge.  No further intervention indicated at this time and patient was discharged home

## 2020-05-11 NOTE — Hospital Course (Addendum)
Ms. Godino is a 79 yo CF with PMH recurreng GIBs (AVMs), HTN, CKDIII, COPD (2L chronic O2), afib (no AC 2/2 hx GIB), dCHF (EF 60-65%, Gr2DD on 07/2017 echo) who presented to the hospital with recurrent rectal bleeding, fatigue, and lightheadedness.  Her Hgb on admission was 6.3 g/dL and she received a total of 2 units PRBC after admission.  Due to a large underlying umbilical hernia she has been unable to undergo full CLN beyond transverse colon (and has history of cecal AVMs on CLN 2016). Due to underlying co-morbidities she is undergoing cardiac evaluation for clearance in case of possible need for surgery of hernia repair.   After all evaluations, the patient had voiced significant apprehension with undergoing surgery that would require intubation as she was worried about coming off of the ventilator.  Due to her underlying lung disease this was an increased risk of surgery.  She was cleared from a cardiac standpoint after cardiology evaluation however she remained concerned from a lung standpoint.  Her hemoglobin remained stable and she had no further episodes of rectal bleeding.  She had no immediate indication for urgent/emergent surgery.   She was therefore considered stable for discharging home and will have to have ongoing discussions outpatient regarding her wishes if recurrent bleed and need for surgery in the future.

## 2020-05-11 NOTE — Assessment & Plan Note (Signed)
-   continue oral iron at d/c

## 2020-05-11 NOTE — Assessment & Plan Note (Signed)
-   Baseline creatinine approximately 1.4 - trend BMP; avoid nephrotoxic agents as able

## 2020-05-11 NOTE — Assessment & Plan Note (Signed)
stable °

## 2020-05-11 NOTE — Progress Notes (Signed)
[facility] Progress Note  2 Days Post-Op  Subjective: No acute changes. Hemoglobin stable. Patient is having bowel movements and denies blood in her stools.  Objective: Vital signs in last 24 hours: Temp:  [98.1 F (36.7 C)-99.4 F (37.4 C)] 98.1 F (36.7 C) (10/13 0513) Pulse Rate:  [66-77] 66 (10/13 0513) Resp:  [16-22] 16 (10/13 0513) BP: (120-186)/(37-57) 151/46 (10/13 0513) SpO2:  [94 %-99 %] 94 % (10/13 0826) Last BM Date: 05/10/20  Intake/Output from previous day: 10/12 0701 - 10/13 0700 In: 979.9 [P.O.:600; I.V.:379.9] Out: 400 [Urine:400] Intake/Output this shift: Total I/O In: 240 [P.O.:240] Out: -   PE: Gen:  Alert, NAD, pleasant Pulm:  Normal effort on [place] Abd: Soft, ND, umbilical hernia present, nonreducible but nontender to palpation  Skin: warm and dry, no rashes  Psych: A&Ox3   Lab Results:  Recent Labs    05/09/20 0403 05/09/20 1014 05/10/20 0529 05/10/20 0529 05/10/20 1623 05/11/20 0435  WBC 9.0  --  9.2  --   --   --   HGB 8.9*   < > 8.4*   < > 9.1* 8.4*  HCT 29.3*   < > 27.3*   < > 29.9* 26.7*  PLT 205  --  197  --   --   --    < > = values in this interval not displayed.   BMET Recent Labs    05/10/20 0529 05/11/20 0435  NA 140 136  K 3.9 3.6  CL 104 103  CO2 27 26  GLUCOSE 97 93  BUN 19 19  CREATININE 1.51* 1.64*  CALCIUM 9.0 8.6*   PT/INR No results for input(s): LABPROT, INR in the last 72 hours. CMP     Component Value Date/Time   NA 136 05/11/2020 0435   NA 142 04/11/2016 1007   K 3.6 05/11/2020 0435   K 3.3 (L) 04/11/2016 1007   CL 103 05/11/2020 0435   CO2 26 05/11/2020 0435   CO2 31 (H) 04/11/2016 1007   GLUCOSE 93 05/11/2020 0435   GLUCOSE 78 04/11/2016 1007   BUN 19 05/11/2020 0435   BUN 24.4 04/11/2016 1007   CREATININE 1.64 (H) 05/11/2020 0435   CREATININE 1.36 (H) 02/04/2020 1443   CREATININE 1.1 04/11/2016 1007   CALCIUM 8.6 (L) 05/11/2020 0435   CALCIUM 9.8 04/11/2016 1007   PROT 6.0  (L) 05/08/2020 0336   PROT 6.9 04/11/2016 1007   PROT 7.5 04/11/2016 1007   ALBUMIN 3.1 (L) 05/08/2020 0336   ALBUMIN 3.0 (L) 04/11/2016 1007   AST 12 (L) 05/08/2020 0336   AST 12 04/11/2016 1007   ALT 9 05/08/2020 0336   ALT 12 04/11/2016 1007   ALKPHOS 45 05/08/2020 0336   ALKPHOS 82 04/11/2016 1007   BILITOT 1.0 05/08/2020 0336   BILITOT 0.35 04/11/2016 1007   GFRNONAA 29 (L) 05/11/2020 0435   GFRAA 30 (L) 02/19/2020 1359   Lipase     Component Value Date/Time   LIPASE 31.0 07/27/2010 1428    Studies/Results: No results found.  Anti-infectives: Anti-infectives (From admission, onward)   None     Assessment/Plan 79 yo female presenting with acute on chronic anemia secondary to lower GI bleeding, unable to fully scope due to chronically incarcerated umbilical hernia. Hemoglobin has stabilized after transfusion and patient has not had any further signs of active bleeding. Cardiology has evaluated the patient and placed her at 6% risk of periop cardiac event. I discussed surgery with the patient again today;  I am willing to perform umbilical hernia repair if she has another bleeding event, even though she would be at higher risk for both recurrence and complications due to her comorbidities. I discussed this with the patient, and she seems very hesitant to undergo an operation at this time. She stated that she does not want to be on a ventilator, and I discussed that she would have to be intubated for surgery and her underlying pulmonary disease puts her at higher risk to remain intubated postop. She will consider the options but for now we will continue to monitor for further bleeding and plan for repair only if recurrent bleeding occurs and necessitates endoscopy.   LOS: 3 days   Michaelle Birks, MD [facility] General, Hepatobiliary and Pancreatic Surgery 05/11/20 8:58 AM

## 2020-05-11 NOTE — Assessment & Plan Note (Signed)
Continue allopurinol 

## 2020-05-11 NOTE — Progress Notes (Signed)
Echocardiogram 2D Echocardiogram has been performed.  Oneal Deputy Manraj Yeo 05/11/2020, 1:36 PM

## 2020-05-11 NOTE — Progress Notes (Signed)
PROGRESS NOTE    [name]   QQP:[id number]  DOB: 1941-07-08  DOA: 05/07/2020     3  PCP: Laurie Lima, MD  CC: rectal bleeding, fatigue  Hospital Course: [name] is a 79 yo CF with PMH recurreng GIBs (AVMs), HTN, CKDIII, COPD (2L chronic O2), afib (no AC 2/2 hx GIB), dCHF (EF 60-65%, Gr2DD on 07/2017 echo) who presented to the hospital with recurrent rectal bleeding, fatigue, and lightheadedness.  [name] on admission was 6.3 g/dL and [name] received a total of 2 units PRBC after admission.  Due to a large underlying umbilical hernia [name] has been unable to undergo full CLN beyond transverse colon (and has history of cecal AVMs on CLN 2016). Due to underlying co-morbidities [name] is undergoing cardiac evaluation for clearance in case of possible need for surgery of hernia repair.    Interval History:  No events overnight.  [name] has not had any further rectal bleeding and fatigue seems improved.  Long discussion held bedside this morning regarding options going forward including possible hernia repair.  [name] is undergoing cardiac clearance and having ongoing discussions with surgery in case of need for hernia repair.  Laurie biggest concern still remains being intubated and possibly staying on the ventilator after surgery and possibly never coming off seems to be Laurie biggest fear given Laurie underlying lung disease.  Provided empathetic listening.   Old records reviewed in assessment of this patient  ROS: Constitutional: negative for chills and fevers, Respiratory: negative for cough, Cardiovascular: negative for chest pain and Gastrointestinal: negative for abdominal pain  Assessment & Plan: Acute GI bleeding - long standing history of recurrent GIB. History of cecal AVMs on CLN on 7124; now with umbilical hernia, unable to evaluate colon past transverse colon on CLN - now s/p 2 units PRBC for admission - no further rectal bleeding reported; [name] still labile - continue trending -  further plans regarding hernia repair vs conservative management are ongoing; appreciate GI, cardiology, and surgery input on plan of care   CHF (congestive heart failure), NYHA class III, chronic, diastolic (HCC) - no s/s exacerbation at this time - last echo 2019 reviewed; EF 60-65%, Gr 2 DD - follow up repeat echo ordered today -Cardiology following, appreciate assistance  Chronic kidney disease, stage 3b ([place]) - Baseline creatinine approximately 1.4 - trend BMP; avoid nephrotoxic agents as able   AVM (arteriovenous malformation) of small bowel, acquired with hemorrhage - see GIB  Atrial fibrillation ([place]) - not on anticoagulation due to history of AVMs and GIBs - continue metoprolol   COLD (chronic obstructive lung disease) ([place]) -On 2 L nasal cannula chronically -Patient has a lot of concern about being intubated for potential surgery and coming off of the ventilator given Laurie underlying lung disease  Anemia, iron deficiency - continue oral iron at d/c   Chronic venous stasis dermatitis of both lower extremities - stable   HTN (hypertension) -Continue current regimen in place  Gout -Continue allopurinol    Antimicrobials: none  DVT prophylaxis: SCD Code Status: Partial ; no intubation  Family Communication: none present Disposition Plan: Status is: Inpatient  Remains inpatient appropriate because:Ongoing diagnostic testing needed not appropriate for outpatient work up, Unsafe d/c plan, IV treatments appropriate due to intensity of illness or inability to take PO and Inpatient level of care appropriate due to severity of illness   Dispo: The patient is from: Home  Anticipated d/c is to: Home              Anticipated d/c date is: 2 days              Patient currently is not medically stable to d/c.       Objective: Blood pressure (!) 180/45, pulse 69, temperature 98.9 F (37.2 C), temperature source Oral, resp. rate 16, height 5' (1.524 m),  weight 88.9 kg, SpO2 100 %.  Examination: General appearance: alert, cooperative and no distress Head: Normocephalic, without obvious abnormality, atraumatic Eyes: negative findings: EOMI Lungs: clear to auscultation bilaterally Heart: regular rate and rhythm and S1, S2 normal Abdomen: umbilical hernia appreciated and not reducible (causes pain to back when reduction attempted); BS hypoactive  Extremities: no edema Skin: mobility and turgor normal Neurologic: Grossly normal  Consultants:   GI  Surgery  Cardiology  Procedures:   See reports  Data Reviewed: I have personally reviewed following labs and imaging studies Results for orders placed or performed during the hospital encounter of 05/07/20 (from the past 24 hour(s))  Hemoglobin and hematocrit, blood     Status: Abnormal   Collection Time: 05/10/20  4:23 PM  Result Value Ref Range   Hemoglobin 9.1 (L) 12.0 - 15.0 g/dL   HCT 29.9 (L) 36 - 46 %  Hemoglobin and hematocrit, blood     Status: Abnormal   Collection Time: 05/11/20  4:35 AM  Result Value Ref Range   Hemoglobin 8.4 (L) 12.0 - 15.0 g/dL   HCT 26.7 (L) 36 - 46 %  Basic metabolic panel     Status: Abnormal   Collection Time: 05/11/20  4:35 AM  Result Value Ref Range   Sodium 136 135 - 145 mmol/L   Potassium 3.6 3.5 - 5.1 mmol/L   Chloride 103 98 - 111 mmol/L   CO2 26 22 - 32 mmol/L   Glucose, Bld 93 70 - 99 mg/dL   BUN 19 8 - 23 mg/dL   Creatinine, Ser 1.64 (H) 0.44 - 1.00 mg/dL   Calcium 8.6 (L) 8.9 - 10.3 mg/dL   GFR, Estimated 29 (L) >60 mL/min   Anion gap 7 5 - 15    Recent Results (from the past 240 hour(s))  Respiratory Panel by RT PCR (Flu A&B, Covid) - Nasopharyngeal Swab     Status: None   Collection Time: 05/07/20 11:25 AM   Specimen: Nasopharyngeal Swab  Result Value Ref Range Status   SARS Coronavirus 2 by RT PCR NEGATIVE NEGATIVE Final    Comment: (NOTE) SARS-CoV-2 target nucleic acids are NOT DETECTED.  The SARS-CoV-2 RNA is generally  detectable in upper respiratoy specimens during the acute phase of infection. The lowest concentration of SARS-CoV-2 viral copies this assay can detect is 131 copies/mL. A negative result does not preclude SARS-Cov-2 infection and should not be used as the sole basis for treatment or other patient management decisions. A negative result may occur with  improper specimen collection/handling, submission of specimen other than nasopharyngeal swab, presence of viral mutation(s) within the areas targeted by this assay, and inadequate number of viral copies (<131 copies/mL). A negative result must be combined with clinical observations, patient history, and epidemiological information. The expected result is Negative.  Fact Sheet for Patients:  PinkCheek.be  Fact Sheet for Healthcare Providers:  GravelBags.it  This test is no t yet approved or cleared by the [place] FDA and  has been authorized for detection and/or diagnosis of SARS-CoV-2 by FDA under  an Emergency Use Authorization (EUA). This EUA will remain  in effect (meaning this test can be used) for the duration of the COVID-19 declaration under Section 564(b)(1) of the Act, 21 U.S.C. section 360bbb-3(b)(1), unless the authorization is terminated or revoked sooner.     Influenza A by PCR NEGATIVE NEGATIVE Final   Influenza B by PCR NEGATIVE NEGATIVE Final    Comment: (NOTE) The Xpert Xpress SARS-CoV-2/FLU/RSV assay is intended as an aid in  the diagnosis of influenza from Nasopharyngeal swab specimens and  should not be used as a sole basis for treatment. Nasal washings and  aspirates are unacceptable for Xpert Xpress SARS-CoV-2/FLU/RSV  testing.  Fact Sheet for Patients: PinkCheek.be  Fact Sheet for Healthcare Providers: GravelBags.it  This test is not yet approved or cleared by the [place] FDA and   has been authorized for detection and/or diagnosis of SARS-CoV-2 by  FDA under an Emergency Use Authorization (EUA). This EUA will remain  in effect (meaning this test can be used) for the duration of the  Covid-19 declaration under Section 564(b)(1) of the Act, 21  U.S.C. section 360bbb-3(b)(1), unless the authorization is  terminated or revoked. Performed at [facility], [place] [address] [place], [place] [address]      Radiology Studies: No results found. No orders to display    Scheduled Meds: . allopurinol  100 mg Oral Daily  . isosorbide-hydrALAZINE  1 tablet Oral TID  . metoprolol tartrate  25 mg Oral Daily  . pantoprazole (PROTONIX) IV  40 mg Intravenous Q12H  . pravastatin  20 mg Oral q1800  . torsemide  20 mg Oral BID   PRN Meds: acetaminophen, albuterol, ondansetron **OR** ondansetron (ZOFRAN) IV Continuous Infusions: . lactated ringers 20 mL/hr at 05/11/20 0400      LOS: 3 days  Time spent: Greater than 50% of the 35 minute visit was spent in counseling/coordination of care for the patient as laid out in the A&P.   Dwyane Dee, MD [facility] 05/11/2020, 2:23 PM

## 2020-05-12 LAB — BASIC METABOLIC PANEL
Anion gap: 8 (ref 5–15)
BUN: 21 mg/dL (ref 8–23)
CO2: 28 mmol/L (ref 22–32)
Calcium: 8.8 mg/dL — ABNORMAL LOW (ref 8.9–10.3)
Chloride: 100 mmol/L (ref 98–111)
Creatinine, Ser: 1.64 mg/dL — ABNORMAL HIGH (ref 0.44–1.00)
GFR, Estimated: 29 mL/min — ABNORMAL LOW (ref >60)
Glucose, Bld: 103 mg/dL — ABNORMAL HIGH (ref 70–99)
Potassium: 3.8 mmol/L (ref 3.5–5.1)
Sodium: 136 mmol/L (ref 135–145)

## 2020-05-12 LAB — CBC WITH DIFFERENTIAL/PLATELET
Abs Immature Granulocytes: 0.06 10*3/uL (ref 0.00–0.07)
Basophils Absolute: 0 10*3/uL (ref 0.0–0.1)
Basophils Relative: 0 %
Eosinophils Absolute: 0.1 10*3/uL (ref 0.0–0.5)
Eosinophils Relative: 1 %
HCT: 29.2 % — ABNORMAL LOW (ref 36.0–46.0)
Hemoglobin: 9.1 g/dL — ABNORMAL LOW (ref 12.0–15.0)
Immature Granulocytes: 1 %
Lymphocytes Relative: 10 %
Lymphs Abs: 1.1 10*3/uL (ref 0.7–4.0)
MCH: 27.9 pg (ref 26.0–34.0)
MCHC: 31.2 g/dL (ref 30.0–36.0)
MCV: 89.6 fL (ref 80.0–100.0)
Monocytes Absolute: 1.5 10*3/uL — ABNORMAL HIGH (ref 0.1–1.0)
Monocytes Relative: 13 %
Neutro Abs: 8.9 10*3/uL — ABNORMAL HIGH (ref 1.7–7.7)
Neutrophils Relative %: 75 %
Platelets: 195 10*3/uL (ref 150–400)
RBC: 3.26 MIL/uL — ABNORMAL LOW (ref 3.87–5.11)
RDW: 14.4 % (ref 11.5–15.5)
WBC: 11.7 10*3/uL — ABNORMAL HIGH (ref 4.0–10.5)
nRBC: 0 % (ref 0.0–0.2)

## 2020-05-12 LAB — MAGNESIUM: Magnesium: 1.8 mg/dL (ref 1.7–2.4)

## 2020-05-12 MED ORDER — LABETALOL HCL 5 MG/ML IV SOLN
10.0000 mg | INTRAVENOUS | Status: DC | PRN
  Filled 2020-05-12: qty 4

## 2020-05-12 MED ORDER — HYDRALAZINE HCL 25 MG PO TABS: 25.0000 mg | ORAL_TABLET | ORAL | Status: DC | PRN

## 2020-05-12 NOTE — Discharge Summary (Signed)
Physician Discharge Summary  [name] IHW:[id number] DOB: November 08, 1940 DOA: 05/07/2020  PCP: Laurie Lima, MD  Admit date: 05/07/2020 Discharge date: 05/12/2020  Admitted From: home Disposition:  home Discharging physician: Laurie Dee, MD  Recommendations for Outpatient Follow-up:  1. Continue discussions regarding patient's wishes for recurrent GIB in future  Patient discharged to home in Discharge Condition: stable CODE STATUS: Partial, no intubatio  Diet recommendation:  Diet Orders (From admission, onward)    Start     Ordered   05/09/20 1400  Diet clear liquid Room service appropriate? Yes; Fluid consistency: Thin  Diet effective now       Question Answer Comment  Room service appropriate? Yes   Fluid consistency: Thin      05/09/20 1256          Hospital Course: [name] is a 79 yo CF with PMH recurreng GIBs (AVMs), HTN, CKDIII, COPD (2L chronic O2), afib (no AC 2/2 hx GIB), dCHF (EF 60-65%, Gr2DD on 07/2017 echo) who presented to the hospital with recurrent rectal bleeding, fatigue, and lightheadedness.  Her Hgb on admission was 6.3 g/dL and she received a total of 2 units PRBC after admission.  Due to a large underlying umbilical hernia she has been unable to undergo full CLN beyond transverse colon (and has history of cecal AVMs on CLN 2016). Due to underlying co-morbidities she is undergoing cardiac evaluation for clearance in case of possible need for surgery of hernia repair.   After all evaluations, the patient had voiced significant apprehension with undergoing surgery that would require intubation as she was worried about coming off of the ventilator.  Due to her underlying lung disease this was an increased risk of surgery.  She was cleared from a cardiac standpoint after cardiology evaluation however she remained concerned from a lung standpoint.  Her hemoglobin remained stable and she had no further episodes of rectal bleeding.  She had no immediate  indication for urgent/emergent surgery.   She was therefore considered stable for discharging home and will have to have ongoing discussions outpatient regarding her wishes if recurrent bleed and need for surgery in the future.   Acute GI bleeding - long standing history of recurrent GIB. History of cecal AVMs on CLN on 4917; now with umbilical hernia, unable to evaluate colon past transverse colon on CLN - now s/p 2 units PRBC for admission - no further rectal bleeding reported; Hgb still labile - continue trending - further plans regarding hernia repair vs conservative management are ongoing; appreciate GI, cardiology, and surgery input on plan of care  -No further bleeding and hemoglobin stable at 9.1 g/dL on day of discharge.  No further intervention indicated at this time and patient was discharged home  CHF (congestive heart failure), NYHA class III, chronic, diastolic (HCC) - no s/s exacerbation at this time - last echo 2019 reviewed; EF 60-65%, Gr 2 DD -Repeated this hospitalization showing similar EF, 60 to 65% and grade 2 diastolic dysfunction -Cardiology following, appreciate assistance -She was cleared from a cardiovascular standpoint.  If readmitted in the future, reconsult cardiology for repeat clearance/reevaluation  Chronic kidney disease, stage 3b ([place]) - Baseline creatinine approximately 1.4 - trend BMP; avoid nephrotoxic agents as able   AVM (arteriovenous malformation) of small bowel, acquired with hemorrhage - see GIB  Atrial fibrillation ([place]) - not on anticoagulation due to history of AVMs and GIBs - continue metoprolol   COLD (chronic obstructive lung disease) (HCC) -On 2 L nasal cannula chronically -  Patient has a lot of concern about being intubated for potential surgery and coming off of the ventilator given her underlying lung disease  Anemia, iron deficiency - continue oral iron at d/c   Chronic venous stasis dermatitis of both lower extremities -  stable   HTN (hypertension) -Continue current regimen in place  Gout -Continue allopurinol    The patient's chronic medical conditions were treated accordingly per the patient's home medication regimen except as noted.  On day of discharge, patient was felt deemed stable for discharge. Patient/family member advised to call PCP or come back to ER if needed.   Discharge Diagnoses:   Principal Diagnosis: Symptomatic anemia  Active Hospital Problems   Diagnosis Date Noted  . Symptomatic anemia 08/10/2017  . Acute GI bleeding 05/07/2020    Priority: High  . CHF (congestive heart failure), NYHA class III, chronic, diastolic ([place]) 85/27/7824    Priority: Medium  . Chronic kidney disease, stage 3b ([place]) 06/20/2010    Priority: Low  . GI bleed 05/08/2020  . AVM (arteriovenous malformation) of small bowel, acquired with hemorrhage 08/14/2018  . Atrial fibrillation ([place]) 10/17/2017  . COLD (chronic obstructive lung disease) ([place])   . Anemia, iron deficiency 11/16/2014  . Cor pulmonale ([place]) 11/16/2014  . Chronic venous stasis dermatitis of both lower extremities 06/02/2013  . Gout 01/10/2009  . HTN (hypertension) 07/30/1988    Resolved Hospital Problems  No resolved problems to display.    Discharge Instructions    Increase activity slowly   Complete by: As directed      Allergies as of 05/12/2020      Reactions   Amoxicillin Diarrhea   DID THE REACTION INVOLVE: Swelling of the face/tongue/throat, SOB, or low BP? N Sudden or severe rash/hives, skin peeling, or the inside of the mouth or nose? N Did it require medical treatment? N When did it last happen?MORE THAN 10 YEARS If all above answers are "NO", may proceed with cephalosporin use.   Celebrex [celecoxib]    edema   Enalapril Cough   Lipitor [atorvastatin]    Muscle aches   Amlodipine Besylate    REACTION: edema   Codeine Sulfate    REACTION: Nausea   Hydrocodone-acetaminophen    REACTION: Nausea       Medication List    TAKE these medications   acetaminophen 325 MG tablet Commonly known as: TYLENOL Take 325-650 mg by mouth daily as needed for moderate pain, fever or headache.   albuterol 108 (90 Base) MCG/ACT inhaler Commonly known as: ProAir HFA Inhale 1-2 puffs into the lungs every 6 (six) hours as needed for wheezing or shortness of breath.   allopurinol 100 MG tablet Commonly known as: ZYLOPRIM TAKE 1 TABLET BY MOUTH EVERY DAY FOR GOUT What changed:   how much to take  how to take this  when to take this  additional instructions   BiDil 20-37.5 MG tablet Generic drug: isosorbide-hydrALAZINE TAKE 1 TABLET BY MOUTH THREE TIMES DAILY   ferrous sulfate 325 (65 FE) MG tablet Take 1 tablet (325 mg total) by mouth 2 (two) times daily with a meal.   metoprolol tartrate 25 MG tablet Commonly known as: LOPRESSOR Take 25 mg by mouth daily.   Pitavastatin Calcium 1 MG Tabs Take 1 tablet (1 mg total) by mouth daily.   torsemide 20 MG tablet Commonly known as: DEMADEX TAKE 1 TABLET(20 MG) BY MOUTH TWICE DAILY What changed: See the new instructions.       Allergies  Allergen Reactions  . Amoxicillin Diarrhea    DID THE REACTION INVOLVE: Swelling of the face/tongue/throat, SOB, or low BP? N Sudden or severe rash/hives, skin peeling, or the inside of the mouth or nose? N Did it require medical treatment? N When did it last happen?MORE THAN 10 YEARS If all above answers are "NO", may proceed with cephalosporin use.   . Celebrex [Celecoxib]     edema  . Enalapril Cough  . Lipitor [Atorvastatin]     Muscle aches  . Amlodipine Besylate     REACTION: edema  . Codeine Sulfate     REACTION: Nausea  . Hydrocodone-Acetaminophen     REACTION: Nausea    Consultations: GI Surgery Cardiology  Discharge Exam: BP (!) 171/52 (BP Location: Left Arm)   Pulse 71   Temp 99 F (37.2 C) (Oral)   Resp 18   Ht 5' (1.524 m)   Wt 88.9 kg   SpO2 99%   BMI 38.28  kg/m  General appearance: alert, cooperative and no distress Head: Normocephalic, without obvious abnormality, atraumatic Eyes: negative findings: EOMI Lungs: clear to auscultation bilaterally Heart: regular rate and rhythm and S1, S2 normal Abdomen: umbilical hernia appreciated and not reducible (causes pain to back when reduction attempted); BS hypoactive  Extremities: no edema Skin: mobility and turgor normal Neurologic: Grossly normal  The results of significant diagnostics from this hospitalization (including imaging, microbiology, ancillary and laboratory) are listed below for reference.   Microbiology: Recent Results (from the past 240 hour(s))  Respiratory Panel by RT PCR (Flu A&B, Covid) - Nasopharyngeal Swab     Status: None   Collection Time: 05/07/20 11:25 AM   Specimen: Nasopharyngeal Swab  Result Value Ref Range Status   SARS Coronavirus 2 by RT PCR NEGATIVE NEGATIVE Final    Comment: (NOTE) SARS-CoV-2 target nucleic acids are NOT DETECTED.  The SARS-CoV-2 RNA is generally detectable in upper respiratoy specimens during the acute phase of infection. The lowest concentration of SARS-CoV-2 viral copies this assay can detect is 131 copies/mL. A negative result does not preclude SARS-Cov-2 infection and should not be used as the sole basis for treatment or other patient management decisions. A negative result may occur with  improper specimen collection/handling, submission of specimen other than nasopharyngeal swab, presence of viral mutation(s) within the areas targeted by this assay, and inadequate number of viral copies (<131 copies/mL). A negative result must be combined with clinical observations, patient history, and epidemiological information. The expected result is Negative.  Fact Sheet for Patients:  PinkCheek.be  Fact Sheet for Healthcare Providers:  GravelBags.it  This test is no t yet approved  or cleared by the [place] FDA and  has been authorized for detection and/or diagnosis of SARS-CoV-2 by FDA under an Emergency Use Authorization (EUA). This EUA will remain  in effect (meaning this test can be used) for the duration of the COVID-19 declaration under Section 564(b)(1) of the Act, 21 U.S.C. section 360bbb-3(b)(1), unless the authorization is terminated or revoked sooner.     Influenza A by PCR NEGATIVE NEGATIVE Final   Influenza B by PCR NEGATIVE NEGATIVE Final    Comment: (NOTE) The Xpert Xpress SARS-CoV-2/FLU/RSV assay is intended as an aid in  the diagnosis of influenza from Nasopharyngeal swab specimens and  should not be used as a sole basis for treatment. Nasal washings and  aspirates are unacceptable for Xpert Xpress SARS-CoV-2/FLU/RSV  testing.  Fact Sheet for Patients: PinkCheek.be  Fact Sheet for Healthcare Providers: GravelBags.it  This test is not yet approved or cleared by the [place] and  has been authorized for detection and/or diagnosis of SARS-CoV-2 by  FDA under an Emergency Use Authorization (EUA). This EUA will remain  in effect (meaning this test can be used) for the duration of the  Covid-19 declaration under Section 564(b)(1) of the Act, 21  U.S.C. section 360bbb-3(b)(1), unless the authorization is  terminated or revoked. Performed at [facility], [place] [address] [place], [place] [address]      Labs: BNP (last 3 results) No results for input(s): BNP in the last 8760 hours. Basic Metabolic Panel: Recent Labs  Lab 05/08/20 0336 05/09/20 0403 05/10/20 0529 05/11/20 0435 05/12/20 0542  NA 139 140 140 136 136  K 3.8 3.9 3.9 3.6 3.8  CL 105 104 104 103 100  CO2 27 26 27 26 28   GLUCOSE 98 109* 97 93 103*  BUN 31* 28* 19 19 21   CREATININE 1.35* 1.64* 1.51* 1.64* 1.64*  CALCIUM 8.8* 9.1 9.0 8.6* 8.8*  MG  --   --   --   --  1.8   Liver  Function Tests: Recent Labs  Lab 05/07/20 1036 05/08/20 0336  AST 13* 12*  ALT 10 9  ALKPHOS 51 45  BILITOT 0.4 1.0  PROT 6.6 6.0*  ALBUMIN 3.4* 3.1*   No results for input(s): LIPASE, AMYLASE in the last 168 hours. No results for input(s): AMMONIA in the last 168 hours. CBC: Recent Labs  Lab 05/07/20 1036 05/07/20 2200 05/09/20 0403 05/09/20 1014 05/10/20 0529 05/10/20 1623 05/11/20 0435 05/11/20 1630 05/12/20 0542  WBC 10.2  --  9.0  --  9.2  --   --   --  11.7*  NEUTROABS  --   --   --   --   --   --   --   --  8.9*  HGB 6.3*   < > 8.9*   < > 8.4* 9.1* 8.4* 8.9* 9.1*  HCT 20.6*   < > 29.3*   < > 27.3* 29.9* 26.7* 27.3* 29.2*  MCV 88.0  --  91.6  --  91.0  --   --   --  89.6  PLT 251  --  205  --  197  --   --   --  195   < > = values in this interval not displayed.   Cardiac Enzymes: No results for input(s): CKTOTAL, CKMB, CKMBINDEX, TROPONINI in the last 168 hours. BNP: Invalid input(s): POCBNP CBG: No results for input(s): GLUCAP in the last 168 hours. D-Dimer No results for input(s): DDIMER in the last 72 hours. Hgb A1c No results for input(s): HGBA1C in the last 72 hours. Lipid Profile No results for input(s): CHOL, HDL, LDLCALC, TRIG, CHOLHDL, LDLDIRECT in the last 72 hours. Thyroid function studies No results for input(s): TSH, T4TOTAL, T3FREE, THYROIDAB in the last 72 hours.  Invalid input(s): FREET3 Anemia work up No results for input(s): VITAMINB12, FOLATE, FERRITIN, TIBC, IRON, RETICCTPCT in the last 72 hours. Urinalysis    Component Value Date/Time   COLORURINE YELLOW 11/05/2017 1103   APPEARANCEUR CLEAR 11/05/2017 1103   LABSPEC <=1.005 (A) 11/05/2017 1103   PHURINE 6.5 11/05/2017 1103   GLUCOSEU NEGATIVE 11/05/2017 1103   HGBUR NEGATIVE 11/05/2017 1103   HGBUR moderate 02/02/2008 1439   BILIRUBINUR NEGATIVE 11/05/2017 1103   KETONESUR NEGATIVE 11/05/2017 1103   PROTEINUR 100 (A) 06/01/2013 1722   UROBILINOGEN 0.2 11/05/2017 1103  NITRITE NEGATIVE 11/05/2017 1103   LEUKOCYTESUR NEGATIVE 11/05/2017 1103   Sepsis Labs Invalid input(s): PROCALCITONIN,  WBC,  LACTICIDVEN Microbiology Recent Results (from the past 240 hour(s))  Respiratory Panel by RT PCR (Flu A&B, Covid) - Nasopharyngeal Swab     Status: None   Collection Time: 05/07/20 11:25 AM   Specimen: Nasopharyngeal Swab  Result Value Ref Range Status   SARS Coronavirus 2 by RT PCR NEGATIVE NEGATIVE Final    Comment: (NOTE) SARS-CoV-2 target nucleic acids are NOT DETECTED.  The SARS-CoV-2 RNA is generally detectable in upper respiratoy specimens during the acute phase of infection. The lowest concentration of SARS-CoV-2 viral copies this assay can detect is 131 copies/mL. A negative result does not preclude SARS-Cov-2 infection and should not be used as the sole basis for treatment or other patient management decisions. A negative result may occur with  improper specimen collection/handling, submission of specimen other than nasopharyngeal swab, presence of viral mutation(s) within the areas targeted by this assay, and inadequate number of viral copies (<131 copies/mL). A negative result must be combined with clinical observations, patient history, and epidemiological information. The expected result is Negative.  Fact Sheet for Patients:  PinkCheek.be  Fact Sheet for Healthcare Providers:  GravelBags.it  This test is no t yet approved or cleared by the [place] FDA and  has been authorized for detection and/or diagnosis of SARS-CoV-2 by FDA under an Emergency Use Authorization (EUA). This EUA will remain  in effect (meaning this test can be used) for the duration of the COVID-19 declaration under Section 564(b)(1) of the Act, 21 U.S.C. section 360bbb-3(b)(1), unless the authorization is terminated or revoked sooner.     Influenza A by PCR NEGATIVE NEGATIVE Final   Influenza B by PCR  NEGATIVE NEGATIVE Final    Comment: (NOTE) The Xpert Xpress SARS-CoV-2/FLU/RSV assay is intended as an aid in  the diagnosis of influenza from Nasopharyngeal swab specimens and  should not be used as a sole basis for treatment. Nasal washings and  aspirates are unacceptable for Xpert Xpress SARS-CoV-2/FLU/RSV  testing.  Fact Sheet for Patients: PinkCheek.be  Fact Sheet for Healthcare Providers: GravelBags.it  This test is not yet approved or cleared by the [place] FDA and  has been authorized for detection and/or diagnosis of SARS-CoV-2 by  FDA under an Emergency Use Authorization (EUA). This EUA will remain  in effect (meaning this test can be used) for the duration of the  Covid-19 declaration under Section 564(b)(1) of the Act, 21  U.S.C. section 360bbb-3(b)(1), unless the authorization is  terminated or revoked. Performed at [facility], [place] [address] [place], [place] [address]     Procedures/Studies: ECHOCARDIOGRAM COMPLETE  Result Date: 05/11/2020    ECHOCARDIOGRAM REPORT   Patient Name:   RASHAUN CURL Kozub Date of Exam: 05/11/2020 Medical Rec #:  [id number]       Height:       60.0 in Accession #:    [id number]      Weight:       196.0 lb Date of Birth:  [birth year]      BSA:          1.851 m Patient Age:    89 years        BP:           151/46 mmHg Patient Gender: F               HR:  66 bpm. Exam Location:  Inpatient Procedure: 2D Echo, Color Doppler and Cardiac Doppler Indications:    R01.1 Murmur  History:        Patient has prior history of Echocardiogram examinations, most                 recent 10/18/2017. CHF, COPD, Arrythmias:Atrial Fibrillation;                 Risk Factors:Hypertension, Dyslipidemia and Sleep Apnea.  Sonographer:    Raquel Sarna Senior RDCS Referring Phys: [place]  1. Left ventricular ejection fraction, by estimation, is 60 to 65%. The left  ventricle has normal function. The left ventricle has no regional wall motion abnormalities. There is moderate concentric left ventricular hypertrophy. Left ventricular diastolic parameters are consistent with Grade II diastolic dysfunction (pseudonormalization).  2. Right ventricular systolic function is normal. The right ventricular size is normal.  3. Left atrial size was mildly dilated.  4. The mitral valve is normal in structure. Trivial mitral valve regurgitation.  5. The aortic valve is tricuspid. There is mild calcification of the aortic valve. There is mild thickening of the aortic valve. Aortic valve regurgitation is trivial. Mild aortic valve sclerosis is present, with no evidence of aortic valve stenosis.  6. The inferior vena cava is normal in size with greater than 50% respiratory variability, suggesting right atrial pressure of 3 mmHg. Comparison(s): No significant change from prior study. FINDINGS  Left Ventricle: Left ventricular ejection fraction, by estimation, is 60 to 65%. The left ventricle has normal function. The left ventricle has no regional wall motion abnormalities. The left ventricular internal cavity size was normal in size. There is  moderate concentric left ventricular hypertrophy. Left ventricular diastolic parameters are consistent with Grade II diastolic dysfunction (pseudonormalization). Right Ventricle: The right ventricular size is normal. Right vetricular wall thickness was not assessed. Right ventricular systolic function is normal. Left Atrium: Left atrial size was mildly dilated. Right Atrium: Right atrial size was normal in size. Pericardium: There is no evidence of pericardial effusion. Mitral Valve: The mitral valve is normal in structure. There is mild thickening of the mitral valve leaflet(s). There is mild calcification of the mitral valve leaflet(s). Mild mitral annular calcification. Trivial mitral valve regurgitation. Tricuspid Valve: The tricuspid valve is normal  in structure. Tricuspid valve regurgitation is trivial. Aortic Valve: The aortic valve is tricuspid. There is mild calcification of the aortic valve. There is mild thickening of the aortic valve. Aortic valve regurgitation is trivial. Mild aortic valve sclerosis is present, with no evidence of aortic valve stenosis. Pulmonic Valve: The pulmonic valve was normal in structure. Pulmonic valve regurgitation is trivial. Aorta: The aortic root and ascending aorta are structurally normal, with no evidence of dilitation. Venous: The inferior vena cava is normal in size with greater than 50% respiratory variability, suggesting right atrial pressure of 3 mmHg. IAS/Shunts: No atrial level shunt detected by color flow Doppler.  LEFT VENTRICLE PLAX 2D LVIDd:         3.90 cm  Diastology LVIDs:         2.30 cm  LV e' medial:    5.11 cm/s LV PW:         1.40 cm  LV E/e' medial:  21.5 LV IVS:        1.40 cm  LV e' lateral:   6.20 cm/s LVOT diam:     1.90 cm  LV E/e' lateral: 17.7 LV SV:  88 LV SV Index:   48 LVOT Area:     2.84 cm  RIGHT VENTRICLE RV S prime:     15.10 cm/s TAPSE (M-mode): 2.0 cm LEFT ATRIUM             Index       RIGHT ATRIUM           Index LA diam:        4.00 cm 2.16 cm/m  RA Area:     15.60 cm LA Vol (A2C):   85.0 ml 45.93 ml/m RA Volume:   36.30 ml  19.61 ml/m LA Vol (A4C):   66.0 ml 35.66 ml/m LA Biplane Vol: 77.1 ml 41.66 ml/m  AORTIC VALVE LVOT Vmax:   138.00 cm/s LVOT Vmean:  98.400 cm/s LVOT VTI:    0.312 m  AORTA Ao Root diam: 2.50 cm Ao Asc diam:  3.10 cm MITRAL VALVE MV Area (PHT): 2.22 cm     SHUNTS MV Decel Time: 342 msec     Systemic VTI:  0.31 m MV E velocity: 110.00 cm/s  Systemic Diam: 1.90 cm MV A velocity: 111.00 cm/s MV E/A ratio:  0.99 Gwyndolyn Kaufman MD Electronically signed by Gwyndolyn Kaufman MD Signature Date/Time: 05/11/2020/2:30:49 PM    Final      Time coordinating discharge: Over 30 minutes    Laurie Dee, MD  [facility] 05/12/2020, 2:55 PM

## 2020-05-12 NOTE — Progress Notes (Signed)
Patient was given discharge instructions, and all questions were answered.  Patient was taken to main exit by wheelchair.

## 2020-05-12 NOTE — Care Management Important Message (Signed)
Important Message  Patient Details IM Letter given to the Patient Name: [name] MRN: [id number] Date of Birth: 1941-06-14   Medicare Important Message Given:  Yes     Kerin Salen 05/12/2020, 11:59 AM

## 2020-05-12 NOTE — Final Consult Note (Addendum)
Consultant Final Sign-Off Note    Assessment/Final recommendations  [name] is a 79 y.o. female followed by me for recurrent lower GI bleed, suspected from cecal AVMs, and incarcerated umbilical hernia containing transverse colon. GIB has not been able to be fully evaluated with a colonoscopy due to the hernia. The patient presented 10/9 with bright red blood per rectum and hgb of 6.3. she was transfused 2u pRBC and hgb/hct have remained stable since that time. She has remained hemodynamically stable this hospitalization and has not had any further hematochezia. We have discussed repair of the periumbilical hernia with the patient but, due to her multiple comorbidities and the risk that she would remain on mechanical ventilation post-operatively, she does not want elective repair at this time. [facility] will sign of but will remain available should the patient have recurrent bleeding or need for urgent/emergent surgical intervention.   Wound care (if applicable): N/A   Diet at discharge: per primary team ok to advance diet from [facility] Perspective. Would avoid high fiber foods for at least 2 weeks.   Activity at discharge: per primary team   Follow-up appointment:  As needed   Pending results:  Unresulted Labs (From admission, onward)          Start     Ordered   05/12/20 4801  Basic metabolic panel  Daily,   R      05/11/20 1813   05/12/20 0500  CBC with Differential/Platelet  Daily,   R      05/11/20 1813   05/12/20 0500  Magnesium  Daily,   R      05/11/20 1813           Medication recommendations:   Other recommendations:    Thank you for allowing [place] to participate in the care of your patient!  Please consult [place] again if you have further needs for your patient.  Darci Current Denise Washburn 05/12/2020 8:19 AM    Subjective  NAEO. Tolerating CLD. Reports one small, liquid BM yesterday unsure if it contained blood. No reported bloody BMs overnight from RN. Having small amt  flatus.   Objective  Vital signs in last 24 hours: Temp:  [98.4 F (36.9 C)-99.3 F (37.4 C)] 98.4 F (36.9 C) (10/14 0641) Pulse Rate:  [69-79] 70 (10/14 0641) Resp:  [16-17] 16 (10/14 0641) BP: (177-180)/(42-52) 177/52 (10/14 0641) SpO2:  [94 %-100 %] 99 % (10/14 0641)  General: Gen:  Alert, NAD, pleasant Pulm:  Normal effort on [place] Abd: Soft, ND, umbilical hernia present, nonreducible but minimally tender to palpation with no overlying skin changes Skin: warm and dry, no rashes  Psych: A&Ox3   Pertinent labs and Studies: Recent Labs    05/10/20 0529 05/10/20 1623 05/11/20 0435 05/11/20 1630 05/12/20 0542  WBC 9.2  --   --   --  11.7*  HGB 8.4*   < > 8.4* 8.9* 9.1*  HCT 27.3*   < > 26.7* 27.3* 29.2*   < > = values in this interval not displayed.   BMET Recent Labs    05/11/20 0435 05/12/20 0542  NA 136 136  K 3.6 3.8  CL 103 100  CO2 26 28  GLUCOSE 93 103*  BUN 19 21  CREATININE 1.64* 1.64*  CALCIUM 8.6* 8.8*   No results for input(s): LABURIN in the last 72 hours. Results for orders placed or performed during the hospital encounter of 05/07/20  Respiratory Panel by RT PCR (Flu A&B, Covid) - Nasopharyngeal Swab  Status: None   Collection Time: 05/07/20 11:25 AM   Specimen: Nasopharyngeal Swab  Result Value Ref Range Status   SARS Coronavirus 2 by RT PCR NEGATIVE NEGATIVE Final    Comment: (NOTE) SARS-CoV-2 target nucleic acids are NOT DETECTED.  The SARS-CoV-2 RNA is generally detectable in upper respiratoy specimens during the acute phase of infection. The lowest concentration of SARS-CoV-2 viral copies this assay can detect is 131 copies/mL. A negative result does not preclude SARS-Cov-2 infection and should not be used as the sole basis for treatment or other patient management decisions. A negative result may occur with  improper specimen collection/handling, submission of specimen other than nasopharyngeal swab, presence of viral mutation(s)  within the areas targeted by this assay, and inadequate number of viral copies (<131 copies/mL). A negative result must be combined with clinical observations, patient history, and epidemiological information. The expected result is Negative.  Fact Sheet for Patients:  PinkCheek.be  Fact Sheet for Healthcare Providers:  GravelBags.it  This test is no t yet approved or cleared by the [place] FDA and  has been authorized for detection and/or diagnosis of SARS-CoV-2 by FDA under an Emergency Use Authorization (EUA). This EUA will remain  in effect (meaning this test can be used) for the duration of the COVID-19 declaration under Section 564(b)(1) of the Act, 21 U.S.C. section 360bbb-3(b)(1), unless the authorization is terminated or revoked sooner.     Influenza A by PCR NEGATIVE NEGATIVE Final   Influenza B by PCR NEGATIVE NEGATIVE Final    Comment: (NOTE) The Xpert Xpress SARS-CoV-2/FLU/RSV assay is intended as an aid in  the diagnosis of influenza from Nasopharyngeal swab specimens and  should not be used as a sole basis for treatment. Nasal washings and  aspirates are unacceptable for Xpert Xpress SARS-CoV-2/FLU/RSV  testing.  Fact Sheet for Patients: PinkCheek.be  Fact Sheet for Healthcare Providers: GravelBags.it  This test is not yet approved or cleared by the [place] FDA and  has been authorized for detection and/or diagnosis of SARS-CoV-2 by  FDA under an Emergency Use Authorization (EUA). This EUA will remain  in effect (meaning this test can be used) for the duration of the  Covid-19 declaration under Section 564(b)(1) of the Act, 21  U.S.C. section 360bbb-3(b)(1), unless the authorization is  terminated or revoked. Performed at [facility], [place] [address], [place], [place] [address]     Imaging: ECHOCARDIOGRAM  COMPLETE  Result Date: 05/11/2020    ECHOCARDIOGRAM REPORT   Patient Name:   CAMREIGH MICHIE Mcferran Date of Exam: 05/11/2020 Medical Rec #:  [id number]       Height:       60.0 in Accession #:    [id number]      Weight:       196.0 lb Date of Birth:  26-Sep-1940      BSA:          1.851 m Patient Age:    72 years        BP:           151/46 mmHg Patient Gender: F               HR:           66 bpm. Exam Location:  Inpatient Procedure: 2D Echo, Color Doppler and Cardiac Doppler Indications:    R01.1 Murmur  History:        Patient has prior history of Echocardiogram examinations, most  recent 10/18/2017. CHF, COPD, Arrythmias:Atrial Fibrillation;                 Risk Factors:Hypertension, Dyslipidemia and Sleep Apnea.  Sonographer:    Raquel Sarna Senior RDCS Referring Phys: [place]  1. Left ventricular ejection fraction, by estimation, is 60 to 65%. The left ventricle has normal function. The left ventricle has no regional wall motion abnormalities. There is moderate concentric left ventricular hypertrophy. Left ventricular diastolic parameters are consistent with Grade II diastolic dysfunction (pseudonormalization).  2. Right ventricular systolic function is normal. The right ventricular size is normal.  3. Left atrial size was mildly dilated.  4. The mitral valve is normal in structure. Trivial mitral valve regurgitation.  5. The aortic valve is tricuspid. There is mild calcification of the aortic valve. There is mild thickening of the aortic valve. Aortic valve regurgitation is trivial. Mild aortic valve sclerosis is present, with no evidence of aortic valve stenosis.  6. The inferior vena cava is normal in size with greater than 50% respiratory variability, suggesting right atrial pressure of 3 mmHg. Comparison(s): No significant change from prior study. FINDINGS  Left Ventricle: Left ventricular ejection fraction, by estimation, is 60 to 65%. The left ventricle has normal function. The  left ventricle has no regional wall motion abnormalities. The left ventricular internal cavity size was normal in size. There is  moderate concentric left ventricular hypertrophy. Left ventricular diastolic parameters are consistent with Grade II diastolic dysfunction (pseudonormalization). Right Ventricle: The right ventricular size is normal. Right vetricular wall thickness was not assessed. Right ventricular systolic function is normal. Left Atrium: Left atrial size was mildly dilated. Right Atrium: Right atrial size was normal in size. Pericardium: There is no evidence of pericardial effusion. Mitral Valve: The mitral valve is normal in structure. There is mild thickening of the mitral valve leaflet(s). There is mild calcification of the mitral valve leaflet(s). Mild mitral annular calcification. Trivial mitral valve regurgitation. Tricuspid Valve: The tricuspid valve is normal in structure. Tricuspid valve regurgitation is trivial. Aortic Valve: The aortic valve is tricuspid. There is mild calcification of the aortic valve. There is mild thickening of the aortic valve. Aortic valve regurgitation is trivial. Mild aortic valve sclerosis is present, with no evidence of aortic valve stenosis. Pulmonic Valve: The pulmonic valve was normal in structure. Pulmonic valve regurgitation is trivial. Aorta: The aortic root and ascending aorta are structurally normal, with no evidence of dilitation. Venous: The inferior vena cava is normal in size with greater than 50% respiratory variability, suggesting right atrial pressure of 3 mmHg. IAS/Shunts: No atrial level shunt detected by color flow Doppler.  LEFT VENTRICLE PLAX 2D LVIDd:         3.90 cm  Diastology LVIDs:         2.30 cm  LV e' medial:    5.11 cm/s LV PW:         1.40 cm  LV E/e' medial:  21.5 LV IVS:        1.40 cm  LV e' lateral:   6.20 cm/s LVOT diam:     1.90 cm  LV E/e' lateral: 17.7 LV SV:         88 LV SV Index:   48 LVOT Area:     2.84 cm  RIGHT VENTRICLE  RV S prime:     15.10 cm/s TAPSE (M-mode): 2.0 cm LEFT ATRIUM             Index  RIGHT ATRIUM           Index LA diam:        4.00 cm 2.16 cm/m  RA Area:     15.60 cm LA Vol (A2C):   85.0 ml 45.93 ml/m RA Volume:   36.30 ml  19.61 ml/m LA Vol (A4C):   66.0 ml 35.66 ml/m LA Biplane Vol: 77.1 ml 41.66 ml/m  AORTIC VALVE LVOT Vmax:   138.00 cm/s LVOT Vmean:  98.400 cm/s LVOT VTI:    0.312 m  AORTA Ao Root diam: 2.50 cm Ao Asc diam:  3.10 cm MITRAL VALVE MV Area (PHT): 2.22 cm     SHUNTS MV Decel Time: 342 msec     Systemic VTI:  0.31 m MV E velocity: 110.00 cm/s  Systemic Diam: 1.90 cm MV A velocity: 111.00 cm/s MV E/A ratio:  0.99 Gwyndolyn Kaufman MD Electronically signed by Gwyndolyn Kaufman MD Signature Date/Time: 05/11/2020/2:30:49 PM    Final

## 2020-05-12 NOTE — Progress Notes (Signed)
Progress Note  Patient Name: [name] Date of Encounter: 05/12/2020  Primary Cardiologist: Larae Grooms, MD  Subjective   Doing ok today. No specific complaints.   Inpatient Medications    Scheduled Meds: . allopurinol  100 mg Oral Daily  . isosorbide-hydrALAZINE  1 tablet Oral TID  . metoprolol tartrate  25 mg Oral Daily  . pantoprazole (PROTONIX) IV  40 mg Intravenous Q12H  . pravastatin  20 mg Oral q1800  . torsemide  20 mg Oral BID   Continuous Infusions: . lactated ringers 20 mL/hr at 05/12/20 0100   PRN Meds: acetaminophen, albuterol, hydrALAZINE, labetalol, ondansetron **OR** ondansetron (ZOFRAN) IV   Vital Signs    Vitals:   05/11/20 0826 05/11/20 1341 05/11/20 2123 05/12/20 0641  BP:  (!) 180/45 (!) 177/42 (!) 177/52  Pulse:  69 79 70  Resp:  16 17 16   Temp:  98.9 F (37.2 C) 99.3 F (37.4 C) 98.4 F (36.9 C)  TempSrc:  Oral Oral Oral  SpO2: 94% 100% 97% 99%  Weight:      Height:        Intake/Output Summary (Last 24 hours) at 05/12/2020 0806 Last data filed at 05/12/2020 0100 Gross per 24 hour  Intake 900 ml  Output --  Net 900 ml   Filed Weights   05/07/20 1034  Weight: 88.9 kg    Physical Exam   General: Overweight, NAD Neck: Negative for carotid bruits. No JVD Lungs:Clear to ausculation bilaterally. No wheezes, rales, or rhonchi. Breathing is unlabored. Cardiovascular: RRR with S1 S2. No murmurs Abdomen: Soft, non-tender, non-distended. No obvious abdominal masses. Extremities: No edema. Radial pulses 2+ bilaterally Neuro: Alert and oriented. No focal deficits. No facial asymmetry. MAE spontaneously. Psych: Responds to questions appropriately with normal affect.    Labs    Chemistry Recent Labs  Lab 05/07/20 1036 05/07/20 1036 05/08/20 0336 05/09/20 0403 05/10/20 0529 05/11/20 0435 05/12/20 0542  NA 137   < > 139   < > 140 136 136  K 3.6   < > 3.8   < > 3.9 3.6 3.8  CL 103   < > 105   < > 104 103 100   CO2 26   < > 27   < > 27 26 28   GLUCOSE 140*   < > 98   < > 97 93 103*  BUN 39*   < > 31*   < > 19 19 21   CREATININE 1.55*   < > 1.35*   < > 1.51* 1.64* 1.64*  CALCIUM 8.7*   < > 8.8*   < > 9.0 8.6* 8.8*  PROT 6.6  --  6.0*  --   --   --   --   ALBUMIN 3.4*  --  3.1*  --   --   --   --   AST 13*  --  12*  --   --   --   --   ALT 10  --  9  --   --   --   --   ALKPHOS 51  --  45  --   --   --   --   BILITOT 0.4  --  1.0  --   --   --   --   GFRNONAA 32*   < > 37*   < > 33* 29* 29*  ANIONGAP 8   < > 7   < > 9 7 8    < > =  values in this interval not displayed.     Hematology Recent Labs  Lab 05/09/20 0403 05/09/20 1014 05/10/20 0529 05/10/20 1623 05/11/20 0435 05/11/20 1630 05/12/20 0542  WBC 9.0  --  9.2  --   --   --  11.7*  RBC 3.20*  --  3.00*  --   --   --  3.26*  HGB 8.9*   < > 8.4*   < > 8.4* 8.9* 9.1*  HCT 29.3*   < > 27.3*   < > 26.7* 27.3* 29.2*  MCV 91.6  --  91.0  --   --   --  89.6  MCH 27.8  --  28.0  --   --   --  27.9  MCHC 30.4  --  30.8  --   --   --  31.2  RDW 14.6  --  14.7  --   --   --  14.4  PLT 205  --  197  --   --   --  195   < > = values in this interval not displayed.    Cardiac EnzymesNo results for input(s): TROPONINI in the last 168 hours. No results for input(s): TROPIPOC in the last 168 hours.   BNPNo results for input(s): BNP, PROBNP in the last 168 hours.   DDimer No results for input(s): DDIMER in the last 168 hours.   Radiology    ECHOCARDIOGRAM COMPLETE  Result Date: 05/11/2020    ECHOCARDIOGRAM REPORT   Patient Name:   [name] Date of Exam: 05/11/2020 Medical Rec #:  [id number]       Height:       60.0 in Accession #:    [id number]      Weight:       196.0 lb Date of Birth:  Dec 25, 1940      BSA:          1.851 m Patient Age:    79 years        BP:           151/46 mmHg Patient Gender: F               HR:           66 bpm. Exam Location:  Inpatient Procedure: 2D Echo, Color Doppler and Cardiac Doppler Indications:    R01.1  Murmur  History:        Patient has prior history of Echocardiogram examinations, most                 recent 10/18/2017. CHF, COPD, Arrythmias:Atrial Fibrillation;                 Risk Factors:Hypertension, Dyslipidemia and Sleep Apnea.  Sonographer:    Raquel Sarna Senior RDCS Referring Phys: [place]  1. Left ventricular ejection fraction, by estimation, is 60 to 65%. The left ventricle has normal function. The left ventricle has no regional wall motion abnormalities. There is moderate concentric left ventricular hypertrophy. Left ventricular diastolic parameters are consistent with Grade II diastolic dysfunction (pseudonormalization).  2. Right ventricular systolic function is normal. The right ventricular size is normal.  3. Left atrial size was mildly dilated.  4. The mitral valve is normal in structure. Trivial mitral valve regurgitation.  5. The aortic valve is tricuspid. There is mild calcification of the aortic valve. There is mild thickening of the aortic valve. Aortic valve regurgitation is trivial. Mild aortic valve sclerosis is present, with no  evidence of aortic valve stenosis.  6. The inferior vena cava is normal in size with greater than 50% respiratory variability, suggesting right atrial pressure of 3 mmHg. Comparison(s): No significant change from prior study. FINDINGS  Left Ventricle: Left ventricular ejection fraction, by estimation, is 60 to 65%. The left ventricle has normal function. The left ventricle has no regional wall motion abnormalities. The left ventricular internal cavity size was normal in size. There is  moderate concentric left ventricular hypertrophy. Left ventricular diastolic parameters are consistent with Grade II diastolic dysfunction (pseudonormalization). Right Ventricle: The right ventricular size is normal. Right vetricular wall thickness was not assessed. Right ventricular systolic function is normal. Left Atrium: Left atrial size was mildly dilated.  Right Atrium: Right atrial size was normal in size. Pericardium: There is no evidence of pericardial effusion. Mitral Valve: The mitral valve is normal in structure. There is mild thickening of the mitral valve leaflet(s). There is mild calcification of the mitral valve leaflet(s). Mild mitral annular calcification. Trivial mitral valve regurgitation. Tricuspid Valve: The tricuspid valve is normal in structure. Tricuspid valve regurgitation is trivial. Aortic Valve: The aortic valve is tricuspid. There is mild calcification of the aortic valve. There is mild thickening of the aortic valve. Aortic valve regurgitation is trivial. Mild aortic valve sclerosis is present, with no evidence of aortic valve stenosis. Pulmonic Valve: The pulmonic valve was normal in structure. Pulmonic valve regurgitation is trivial. Aorta: The aortic root and ascending aorta are structurally normal, with no evidence of dilitation. Venous: The inferior vena cava is normal in size with greater than 50% respiratory variability, suggesting right atrial pressure of 3 mmHg. IAS/Shunts: No atrial level shunt detected by color flow Doppler.  LEFT VENTRICLE PLAX 2D LVIDd:         3.90 cm  Diastology LVIDs:         2.30 cm  LV e' medial:    5.11 cm/s LV PW:         1.40 cm  LV E/e' medial:  21.5 LV IVS:        1.40 cm  LV e' lateral:   6.20 cm/s LVOT diam:     1.90 cm  LV E/e' lateral: 17.7 LV SV:         88 LV SV Index:   48 LVOT Area:     2.84 cm  RIGHT VENTRICLE RV S prime:     15.10 cm/s TAPSE (M-mode): 2.0 cm LEFT ATRIUM             Index       RIGHT ATRIUM           Index LA diam:        4.00 cm 2.16 cm/m  RA Area:     15.60 cm LA Vol (A2C):   85.0 ml 45.93 ml/m RA Volume:   36.30 ml  19.61 ml/m LA Vol (A4C):   66.0 ml 35.66 ml/m LA Biplane Vol: 77.1 ml 41.66 ml/m  AORTIC VALVE LVOT Vmax:   138.00 cm/s LVOT Vmean:  98.400 cm/s LVOT VTI:    0.312 m  AORTA Ao Root diam: 2.50 cm Ao Asc diam:  3.10 cm MITRAL VALVE MV Area (PHT): 2.22 cm      SHUNTS MV Decel Time: 342 msec     Systemic VTI:  0.31 m MV E velocity: 110.00 cm/s  Systemic Diam: 1.90 cm MV A velocity: 111.00 cm/s MV E/A ratio:  0.99 Gwyndolyn Kaufman MD Electronically signed by Gwyndolyn Kaufman  MD Signature Date/Time: 05/11/2020/2:30:49 PM    Final    Telemetry    Not currently on telemetry however in NSR per ascultation- Personally Reviewed  ECG    No new tracing as of 05/12/20- Personally Reviewed  Cardiac Studies   Echo 05/11/20:  1. Left ventricular ejection fraction, by estimation, is 60 to 65%. The  left ventricle has normal function. The left ventricle has no regional  wall motion abnormalities. There is moderate concentric left ventricular  hypertrophy. Left ventricular  diastolic parameters are consistent with Grade II diastolic dysfunction  (pseudonormalization).  2. Right ventricular systolic function is normal. The right ventricular  size is normal.  3. Left atrial size was mildly dilated.  4. The mitral valve is normal in structure. Trivial mitral valve  regurgitation.  5. The aortic valve is tricuspid. There is mild calcification of the  aortic valve. There is mild thickening of the aortic valve. Aortic valve  regurgitation is trivial. Mild aortic valve sclerosis is present, with no  evidence of aortic valve stenosis.  6. The inferior vena cava is normal in size with greater than 50%  respiratory variability, suggesting right atrial pressure of 3 mmHg.   Patient Profile     79 y.o. female with a hx of chronic SOB, OSA without CPAP, PAF not on AC due to GI bleeding due to AVMs, COPD, HTN, SB, CKD Stage III, and chronic diastolic dysfunction who is being seen today for the evaluation of pre-op eval for hernia repair.  Assessment & Plan    1. Pre-operative evaluation for hernia repair: -Pt seen by cardiology yesterday for pre-operative clearance prior to hernia repair surgery however surgery fells that she is high risk. In the absence  of repeat transfusions, surgery would prefer to hold off for now given higher risk of complication per chart review -Cleared from a CV standpoint however still felt to be higher risk due to complicated co-morbid conditions -Echo performed 05/11/20 with normal LV function, mod LVH, G2DD, mild aortic calcifications with sclerosis and no stenosis.   2. GI Bleed: -Pt presented with acute GI bleeding requiring multiple transfusions>>presumed cecal angioectasis which cannot be reached due to large umbilical hernia. Recommendations are for surgical repair if further bleeding -GI following   3. PAF: -EKG with NSR with 1st AV block>>no AF  -No AC due to recurrent GI bleeding requiring PRBCs  -Hb, 8.4 today>>down from 9.1 yesterday>>>9.1 today    4. Sinus bradycardia: -HR, 70's  -Beta blocker stopped in July 2021  5. HTN: -Stable, 177/52>177/42>180/45 -Continue current regimen   6. COPD: -On supplemental home O2 with chronic SOB   7. CKD stage III: -Baseline creatinine appears to be in the 1.5 range more recently -Creatinine today up to 1.64>>stable from yesterday    Signed, Kathyrn Drown NP-C [place] Pager: [phone] 05/12/2020, 8:06 AM     For questions or updates, please contact   Please consult [email or web address] for contact info under Cardiology/STEMI.

## 2020-05-16 ENCOUNTER — Telehealth: Payer: Self-pay | Admitting: Internal Medicine

## 2020-05-16 NOTE — Telephone Encounter (Signed)
Called pt, LVM.   

## 2020-05-16 NOTE — Telephone Encounter (Signed)
    Patient recently discharges from the hospital She wants to know if an iron infusion is needed

## 2020-05-18 ENCOUNTER — Other Ambulatory Visit: Payer: Self-pay | Admitting: Internal Medicine

## 2020-05-18 DIAGNOSIS — I5042 Chronic combined systolic (congestive) and diastolic (congestive) heart failure: Secondary | ICD-10-CM

## 2020-05-23 ENCOUNTER — Encounter: Payer: Self-pay | Admitting: Internal Medicine

## 2020-05-23 ENCOUNTER — Other Ambulatory Visit: Payer: Self-pay

## 2020-05-23 ENCOUNTER — Ambulatory Visit (INDEPENDENT_AMBULATORY_CARE_PROVIDER_SITE_OTHER): Payer: Medicare Other | Admitting: Internal Medicine

## 2020-05-23 VITALS — BP 186/68 | HR 80 | Temp 98.2°F | Resp 16 | Ht 60.0 in | Wt 196.0 lb

## 2020-05-23 DIAGNOSIS — I480 Paroxysmal atrial fibrillation: Secondary | ICD-10-CM

## 2020-05-23 DIAGNOSIS — J411 Mucopurulent chronic bronchitis: Secondary | ICD-10-CM

## 2020-05-23 DIAGNOSIS — D500 Iron deficiency anemia secondary to blood loss (chronic): Secondary | ICD-10-CM

## 2020-05-23 DIAGNOSIS — D510 Vitamin B12 deficiency anemia due to intrinsic factor deficiency: Secondary | ICD-10-CM | POA: Diagnosis not present

## 2020-05-23 DIAGNOSIS — R918 Other nonspecific abnormal finding of lung field: Secondary | ICD-10-CM

## 2020-05-23 DIAGNOSIS — Z23 Encounter for immunization: Secondary | ICD-10-CM | POA: Diagnosis not present

## 2020-05-23 DIAGNOSIS — K5150 Left sided colitis without complications: Secondary | ICD-10-CM

## 2020-05-23 DIAGNOSIS — I10 Essential (primary) hypertension: Secondary | ICD-10-CM

## 2020-05-23 LAB — CBC WITH DIFFERENTIAL/PLATELET
Basophils Absolute: 0.1 10*3/uL (ref 0.0–0.1)
Basophils Relative: 1 % (ref 0.0–3.0)
Eosinophils Absolute: 0.2 10*3/uL (ref 0.0–0.7)
Eosinophils Relative: 1.8 % (ref 0.0–5.0)
HCT: 28.7 % — ABNORMAL LOW (ref 36.0–46.0)
Hemoglobin: 9.4 g/dL — ABNORMAL LOW (ref 12.0–15.0)
Lymphocytes Relative: 12.6 % (ref 12.0–46.0)
Lymphs Abs: 1.3 10*3/uL (ref 0.7–4.0)
MCHC: 32.8 g/dL (ref 30.0–36.0)
MCV: 82.2 fl (ref 78.0–100.0)
Monocytes Absolute: 0.8 10*3/uL (ref 0.1–1.0)
Monocytes Relative: 7.3 % (ref 3.0–12.0)
Neutro Abs: 8.1 10*3/uL — ABNORMAL HIGH (ref 1.4–7.7)
Neutrophils Relative %: 77.3 % — ABNORMAL HIGH (ref 43.0–77.0)
Platelets: 281 10*3/uL (ref 150.0–400.0)
RBC: 3.49 Mil/uL — ABNORMAL LOW (ref 3.87–5.11)
RDW: 16 % — ABNORMAL HIGH (ref 11.5–15.5)
WBC: 10.4 10*3/uL (ref 4.0–10.5)

## 2020-05-23 LAB — FERRITIN: Ferritin: 53.2 ng/mL (ref 10.0–291.0)

## 2020-05-23 LAB — IRON: Iron: 37 ug/dL — ABNORMAL LOW (ref 42–145)

## 2020-05-23 MED ORDER — BREO ELLIPTA 100-25 MCG/INH IN AEPB: INHALATION_SPRAY | RESPIRATORY_TRACT | 1 refills | Status: DC

## 2020-05-23 MED ORDER — METOPROLOL TARTRATE 25 MG PO TABS: 25.0000 mg | ORAL_TABLET | Freq: Two times a day (BID) | ORAL | 1 refills | Status: DC

## 2020-05-23 NOTE — Patient Instructions (Addendum)
Anemia  Anemia is a condition in which you do not have enough red blood cells or hemoglobin. Hemoglobin is a substance in red blood cells that carries oxygen. When you do not have enough red blood cells or hemoglobin (are anemic), your body cannot get enough oxygen and your organs may not work properly. As a result, you may feel very tired or have other problems. What are the causes? Common causes of anemia include:  Excessive bleeding. Anemia can be caused by excessive bleeding inside or outside the body, including bleeding from the intestine or from periods in women.  Poor nutrition.  Long-lasting (chronic) kidney, thyroid, and liver disease.  Bone marrow disorders.  Cancer and treatments for cancer.  HIV (human immunodeficiency virus) and AIDS (acquired immunodeficiency syndrome).  Treatments for HIV and AIDS.  Spleen problems.  Blood disorders.  Infections, medicines, and autoimmune disorders that destroy red blood cells. What are the signs or symptoms? Symptoms of this condition include:  Minor weakness.  Dizziness.  Headache.  Feeling heartbeats that are irregular or faster than normal (palpitations).  Shortness of breath, especially with exercise.  Paleness.  Cold sensitivity.  Indigestion.  Nausea.  Difficulty sleeping.  Difficulty concentrating. Symptoms may occur suddenly or develop slowly. If your anemia is mild, you may not have symptoms. How is this diagnosed? This condition is diagnosed based on:  Blood tests.  Your medical history.  A physical exam.  Bone marrow biopsy. Your health care provider may also check your stool (feces) for blood and may do additional testing to look for the cause of your bleeding. You may also have other tests, including:  Imaging tests, such as a CT scan or MRI.  Endoscopy.  Colonoscopy. How is this treated? Treatment for this condition depends on the cause. If you continue to lose a lot of blood, you may  need to be treated at a hospital. Treatment may include:  Taking supplements of iron, vitamin S31, or folic acid.  Taking a hormone medicine (erythropoietin) that can help to stimulate red blood cell growth.  Having a blood transfusion. This may be needed if you lose a lot of blood.  Making changes to your diet.  Having surgery to remove your spleen. Follow these instructions at home:  Take over-the-counter and prescription medicines only as told by your health care provider.  Take supplements only as told by your health care provider.  Follow any diet instructions that you were given.  Keep all follow-up visits as told by your health care provider. This is important. Contact a health care provider if:  You develop new bleeding anywhere in the body. Get help right away if:  You are very weak.  You are short of breath.  You have pain in your abdomen or chest.  You are dizzy or feel faint.  You have trouble concentrating.  You have bloody or black, tarry stools.  You vomit repeatedly or you vomit up blood. Summary  Anemia is a condition in which you do not have enough red blood cells or enough of a substance in your red blood cells that carries oxygen (hemoglobin).  Symptoms may occur suddenly or develop slowly.  If your anemia is mild, you may not have symptoms.  This condition is diagnosed with blood tests as well as a medical history and physical exam. Other tests may be needed.  Treatment for this condition depends on the cause of the anemia. This information is not intended to replace advice given to you by  your health care provider. Make sure you discuss any questions you have with your health care provider. Document Revised: 06/28/2017 Document Reviewed: 08/17/2016 Elsevier Patient Education  2020 Elsevier Inc.  

## 2020-05-23 NOTE — Progress Notes (Addendum)
Subjective:  Patient ID: [name], female    DOB: [birth year]  Age: 79 y.o. MRN: [id number]  CC: Anemia  This visit occurred during the SARS-CoV-2 public health emergency.  Safety protocols were in place, including screening questions prior to the visit, additional usage of staff PPE, and extensive cleaning of exam room while observing appropriate contact time as indicated for disinfecting solutions.    HPI [name] presents for f/up - She was recently admitted for symptomatic anemia due to lower GI bleed.  She received a transfusion but not an iron infusion.  Her iron level was not tested.  She is taking an oral iron supplement.  She continues to feel poorly with shortness of breath, wheezing, lower extremity edema, rare nonproductive cough, weight loss, diarrhea, diffuse abdominal pain, and poor appetite.  She has not recently noticed bright red blood per rectum or melena.  She would like to see a gastroenterologist to see if her ulcerative colitis can be treated.  Admit date: 05/07/2020 Discharge date: 05/12/2020  Admitted From: home Disposition:  home Discharging physician: Dwyane Dee, MD  Recommendations for Outpatient Follow-up:  1. Continue discussions regarding patient's wishes for recurrent GIB in future  Patient discharged to home in Discharge Condition: stable CODE STATUS: Partial, no intubatio  Diet recommendation:     Diet Orders (From admission, onward)           Start     Ordered    05/09/20 1400  Diet clear liquid Room service appropriate? Yes; Fluid consistency: Thin  Diet effective now       Question Answer Comment  Room service appropriate? Yes   Fluid consistency: Thin      05/09/20 1256           Hospital Course: [name] is a 79 yo CF with PMH recurreng GIBs (AVMs), HTN, CKDIII, COPD (2L chronic O2), afib (no AC 2/2 hx GIB), dCHF (EF 60-65%, Gr2DD on 07/2017 echo) who presented to the hospital with recurrent rectal  bleeding, fatigue, and lightheadedness.  Her Hgb on admission was 6.3 g/dL and she received a total of 2 units PRBC after admission.  Due to a large underlying umbilical hernia she has been unable to undergo full CLN beyond transverse colon (and has history of cecal AVMs on CLN 2016). Due to underlying co-morbidities she is undergoing cardiac evaluation for clearance in case of possible need for surgery of hernia repair.   After all evaluations, the patient had voiced significant apprehension with undergoing surgery that would require intubation as she was worried about coming off of the ventilator.  Due to her underlying lung disease this was an increased risk of surgery.  She was cleared from a cardiac standpoint after cardiology evaluation however she remained concerned from a lung standpoint.  Her hemoglobin remained stable and she had no further episodes of rectal bleeding.  She had no immediate indication for urgent/emergent surgery.   She was therefore considered stable for discharging home and will have to have ongoing discussions outpatient regarding her wishes if recurrent bleed and need for surgery in the future.   Outpatient Medications Prior to Visit  Medication Sig Dispense Refill  . acetaminophen (TYLENOL) 325 MG tablet Take 325-650 mg by mouth daily as needed for moderate pain, fever or headache.     . albuterol (PROAIR HFA) 108 (90 Base) MCG/ACT inhaler Inhale 1-2 puffs into the lungs every 6 (six) hours as needed for wheezing or shortness of breath. 18 g  11  . allopurinol (ZYLOPRIM) 100 MG tablet TAKE 1 TABLET BY MOUTH EVERY DAY FOR GOUT (Patient taking differently: Take 100 mg by mouth daily. ) 90 tablet 1  . ferrous sulfate 325 (65 FE) MG tablet Take 1 tablet (325 mg total) by mouth 2 (two) times daily with a meal. 180 tablet 1  . isosorbide-hydrALAZINE (BIDIL) 20-37.5 MG tablet Take 1 tablet by mouth 3 (three) times daily. 270 tablet 1  . Pitavastatin Calcium 1 MG TABS Take 1  tablet (1 mg total) by mouth daily. 90 tablet 1  . torsemide (DEMADEX) 20 MG tablet TAKE 1 TABLET(20 MG) BY MOUTH TWICE DAILY (Patient taking differently: Take 20 mg by mouth 2 (two) times daily. ) 180 tablet 0  . metoprolol tartrate (LOPRESSOR) 25 MG tablet Take 25 mg by mouth daily.    . fluticasone furoate-vilanterol (BREO ELLIPTA) 100-25 MCG/INH AEPB INHALE 1 PUFF INTO THE LUNGS DAILY 60 each 1   No facility-administered medications prior to visit.    ROS Review of Systems  Constitutional: Positive for fatigue and unexpected weight change. Negative for appetite change, chills, diaphoresis and fever.  HENT: Negative.   Eyes: Negative for visual disturbance.  Respiratory: Positive for cough, shortness of breath and wheezing. Negative for chest tightness and stridor.   Cardiovascular: Positive for leg swelling. Negative for chest pain and palpitations.  Gastrointestinal: Positive for abdominal pain and diarrhea. Negative for abdominal distention, blood in stool, constipation, nausea, rectal pain and vomiting.  Endocrine: Negative.   Genitourinary: Negative.  Negative for decreased urine volume, difficulty urinating, dysuria, hematuria and urgency.  Musculoskeletal: Negative.   Skin: Negative.  Negative for pallor.  Neurological: Negative.  Negative for dizziness, weakness, light-headedness and numbness.  Hematological: Negative for adenopathy. Does not bruise/bleed easily.  Psychiatric/Behavioral: Negative.     Objective:  BP (!) 186/68   Pulse 80   Temp 98.2 F (36.8 C) (Oral)   Resp 16   Ht 5' (1.524 m)   Wt 196 lb (88.9 kg)   SpO2 95%   BMI 38.28 kg/m   BP Readings from Last 3 Encounters:  05/23/20 (!) 186/68  05/12/20 (!) 171/52  03/08/20 (!) 142/41    Wt Readings from Last 3 Encounters:  05/23/20 196 lb (88.9 kg)  05/07/20 196 lb (88.9 kg)  02/19/20 (!) 205 lb (93 kg)    Physical Exam Vitals reviewed.  Constitutional:      General: She is not in acute  distress.    Appearance: She is not toxic-appearing or diaphoretic.  HENT:     Nose: Nose normal.     Mouth/Throat:     Mouth: Mucous membranes are moist.  Eyes:     General: No scleral icterus.    Conjunctiva/sclera: Conjunctivae normal.  Cardiovascular:     Rate and Rhythm: Normal rate and regular rhythm.     Heart sounds: S1 normal and S2 normal. Murmur heard.  Systolic murmur is present with a grade of 2/6.  No diastolic murmur is present.  No gallop.   Pulmonary:     Effort: Pulmonary effort is normal.     Breath sounds: No stridor. No wheezing, rhonchi or rales.  Abdominal:     General: Abdomen is protuberant. There is no distension.     Palpations: Abdomen is soft. There is no hepatomegaly, splenomegaly or mass.     Tenderness: There is generalized abdominal tenderness. There is no guarding or rebound. Negative signs include Murphy's sign.     Hernia: A  hernia is present. Hernia is present in the ventral area.  Musculoskeletal:        General: Normal range of motion.     Cervical back: Neck supple.     Right lower leg: 1+ Pitting Edema present.     Left lower leg: 1+ Pitting Edema present.  Lymphadenopathy:     Cervical: No cervical adenopathy.  Skin:    General: Skin is warm and dry.     Coloration: Skin is not pale.  Neurological:     General: No focal deficit present.     Mental Status: She is alert.  Psychiatric:        Mood and Affect: Mood normal.        Behavior: Behavior normal.     Lab Results  Component Value Date   WBC 10.4 05/23/2020   HGB 9.4 (L) 05/23/2020   HCT 28.7 (L) 05/23/2020   PLT 281.0 05/23/2020   GLUCOSE 103 (H) 05/12/2020   CHOL 165 02/04/2020   TRIG 162 (H) 02/04/2020   HDL 49 (L) 02/04/2020   LDLDIRECT 107.0 09/04/2016   LDLCALC 90 02/04/2020   ALT 9 05/08/2020   AST 12 (L) 05/08/2020   NA 136 05/12/2020   K 3.8 05/12/2020   CL 100 05/12/2020   CREATININE 1.64 (H) 05/12/2020   BUN 21 05/12/2020   CO2 28 05/12/2020   TSH  2.35 07/02/2019   INR 1.07 08/08/2017   HGBA1C 5.4 11/05/2017   MICROALBUR <0.7 11/05/2017    No results found.  Assessment & Plan:   Armonie was seen today for anemia.  Diagnoses and all orders for this visit:  Iron deficiency anemia due to chronic blood loss- Her anemia has improved but her iron level is low.  I recommended that she receive a series of iron infusions. -     CBC with Differential/Platelet; Future -     Iron; Future -     Ferritin; Future -     Ferritin -     Iron -     CBC with Differential/Platelet  Multiple lung nodules on CT- I do not think it would be beneficial to continue doing follow-up CT scans regarding this.  Vitamin B12 deficiency anemia due to intrinsic factor deficiency- I recommended she continue receiving parenteral B12 replacement therapy.  Mucopurulent chronic bronchitis ([place])- She will restart the LABA/ICS inhaler. -     fluticasone furoate-vilanterol (BREO ELLIPTA) 100-25 MCG/INH AEPB; INHALE 1 PUFF INTO THE LUNGS DAILY  Essential hypertension- Her blood pressure is not adequately well controlled.  Will increase the dose of metoprolol. -     metoprolol tartrate (LOPRESSOR) 25 MG tablet; Take 1 tablet (25 mg total) by mouth 2 (two) times daily.  Paroxysmal atrial fibrillation ([place])- She has good rate and rhythm control.  Anticoagulation is contraindicated. -     metoprolol tartrate (LOPRESSOR) 25 MG tablet; Take 1 tablet (25 mg total) by mouth 2 (two) times daily.  Flu vaccine need -     Flu Vaccine QUAD High Dose(Fluad)  Left sided ulcerative (chronic) colitis ([place]) -     Ambulatory referral to Gastroenterology   I have changed [place] metoprolol tartrate. I am also having her maintain her albuterol, acetaminophen, ferrous sulfate, allopurinol, Pitavastatin Calcium, torsemide, BiDil, and Breo Ellipta.  Meds ordered this encounter  Medications  . fluticasone furoate-vilanterol (BREO ELLIPTA) 100-25 MCG/INH AEPB    Sig:  INHALE 1 PUFF INTO THE LUNGS DAILY    Dispense:  120 each  Refill:  1  . metoprolol tartrate (LOPRESSOR) 25 MG tablet    Sig: Take 1 tablet (25 mg total) by mouth 2 (two) times daily.    Dispense:  180 tablet    Refill:  1     Follow-up: Return in about 3 months (around 08/23/2020).  [name], MD

## 2020-05-24 DIAGNOSIS — J449 Chronic obstructive pulmonary disease, unspecified: Secondary | ICD-10-CM | POA: Diagnosis not present

## 2020-05-24 DIAGNOSIS — L03119 Cellulitis of unspecified part of limb: Secondary | ICD-10-CM | POA: Diagnosis not present

## 2020-05-24 NOTE — Telephone Encounter (Signed)
Called pt, LVM.   See result note.

## 2020-05-24 NOTE — Telephone Encounter (Signed)
Patient returned call, if someone could please give her a call back @ [phone]

## 2020-05-29 ENCOUNTER — Other Ambulatory Visit: Payer: Self-pay | Admitting: Internal Medicine

## 2020-05-29 DIAGNOSIS — M1030 Gout due to renal impairment, unspecified site: Secondary | ICD-10-CM

## 2020-06-15 ENCOUNTER — Encounter: Payer: Self-pay | Admitting: Internal Medicine

## 2020-06-24 DIAGNOSIS — L03119 Cellulitis of unspecified part of limb: Secondary | ICD-10-CM | POA: Diagnosis not present

## 2020-06-24 DIAGNOSIS — J449 Chronic obstructive pulmonary disease, unspecified: Secondary | ICD-10-CM | POA: Diagnosis not present

## 2020-06-27 ENCOUNTER — Other Ambulatory Visit: Payer: Self-pay | Admitting: Internal Medicine

## 2020-06-27 ENCOUNTER — Other Ambulatory Visit: Payer: Self-pay

## 2020-06-27 ENCOUNTER — Ambulatory Visit: Payer: Medicare Other | Admitting: Pharmacist

## 2020-06-27 DIAGNOSIS — E785 Hyperlipidemia, unspecified: Secondary | ICD-10-CM

## 2020-06-27 DIAGNOSIS — I5042 Chronic combined systolic (congestive) and diastolic (congestive) heart failure: Secondary | ICD-10-CM

## 2020-06-27 DIAGNOSIS — I10 Essential (primary) hypertension: Secondary | ICD-10-CM

## 2020-06-27 DIAGNOSIS — J411 Mucopurulent chronic bronchitis: Secondary | ICD-10-CM

## 2020-06-27 MED ORDER — ZYPITAMAG 2 MG PO TABS: 1.0000 | ORAL_TABLET | Freq: Every day | ORAL | 1 refills | Status: DC

## 2020-06-27 NOTE — Patient Instructions (Addendum)
Visit Information  Phone number for Pharmacist: [phone]  Thank you for meeting with me to discuss your medications! I look forward to working with you to achieve your health care goals. Below is a summary of what we talked about during the visit:  Goals Addressed            This Visit's Progress   . Pharmacy Care Plan       CARE PLAN ENTRY (see longitudinal plan of care for additional care plan information)  Current Barriers:  . Chronic Disease Management support, education, and care coordination needs related to Hypertension, Hyperlipidemia, Atrial Fibrillation, Heart Failure, and COPD   Hypertension / Heart Failure BP Readings from Last 3 Encounters:  05/23/20 (!) 186/68  05/12/20 (!) 171/52  03/08/20 (!) 142/41 .  Pharmacist Clinical Goal(s): o Over the next 30 days, patient will work with PharmD and providers to achieve BP goal <140/90 . Current regimen:  o Metoprolol tartrate 12.5 mg twice a day o Torsemide 20 mg - 2 tablets in AM o Isosorbide-hydralazine 20-37.5 mg 2 times daily . Interventions: o Discussed BP goals and benefits of medications for prevention of heart attack / stroke o Advised to check BP daily x 2-3 weeks to ascertain baseline BP . Patient self care activities - Over the next 30 days, patient will: o Check BP daily, document, and provide at future appointments o Ensure daily salt intake < 2300 mg/day  Hyperlipidemia Lab Results  Component Value Date/Time   LDLCALC 90 02/04/2020 02:43 PM   LDLDIRECT 107.0 09/04/2016 03:53 PM .  Pharmacist Clinical Goal(s): o Over the next 30 days, patient will work with PharmD and providers to maintain LDL goal < 100 . Current regimen:  o No medications . Interventions: o Discussed cholesterol goals and benefits of medications for prevention of heart attack / stroke o Discussed patient tolerated Livalo 1 mg but could not continue due to cost o Recommend rosuvastatin 5 mg daily . Patient self care activities  - Over the next 30 days, patient will: o Take medication as prescribed  COPD . Pharmacist Clinical Goal(s) o Over the next 180 days, patient will work with PharmD and providers to optimize therapy . Current regimen:  o Breo Ellipta 100-25 mcg/inh 1 puff daily o Albuterol HFA prn . Interventions: o Pursue patient assistance for Breo in 2022 . Patient self care activities - Over the next 180 days, patient will: o Continue current medications  Medication management . Pharmacist Clinical Goal(s): o Over the next 30 days, patient will work with PharmD and providers to maintain optimal medication adherence . Current pharmacy: [facility] . Interventions o Comprehensive medication review performed. o Continue current medication management strategy . Patient self care activities - Over the next 30 days, patient will: o Focus on medication adherence by fill date o Take medications as prescribed o Report any questions or concerns to PharmD and/or provider(s)  Initial goal documentation      Ms. Splinter was given information about Chronic Care Management services today including:  1. CCM service includes personalized support from designated clinical staff supervised by her physician, including individualized plan of care and coordination with other care providers 2. 24/7 contact phone numbers for assistance for urgent and routine care needs. 3. Standard insurance, coinsurance, copays and deductibles apply for chronic care management only during months in which we provide at least 20 minutes of these services. Most insurances cover these services at 100%, however patients may be responsible for any copay, coinsurance  and/or deductible if applicable. This service may help you avoid the need for more expensive face-to-face services. 4. Only one practitioner may furnish and bill the service in a calendar month. 5. The patient may stop CCM services at any time (effective at the end of the month) by  phone call to the office staff.  Patient agreed to services and verbal consent obtained.   The patient verbalized understanding of instructions, educational materials, and care plan provided today and agreed to receive a mailed copy of patient instructions, educational materials, and care plan.  Telephone follow up appointment with pharmacy team member scheduled for: 1 month  Charlene Brooke, PharmD, BCACP Clinical Pharmacist [facility] [phone]  How to Take Your Blood Pressure Blood pressure is a measurement of how strongly your blood is pressing against the walls of your arteries. Arteries are blood vessels that carry blood from your heart throughout your body. Your health care provider takes your blood pressure at each office visit. You can also take your own blood pressure at home with a blood pressure machine. You may need to take your own blood pressure:  To confirm a diagnosis of high blood pressure (hypertension).  To monitor your blood pressure over time.  To make sure your blood pressure medicine is working. Supplies needed: To take your blood pressure, you will need a blood pressure machine. You can buy a blood pressure machine, or blood pressure monitor, at most drugstores or online. There are several types of home blood pressure monitors. When choosing one, consider the following:  Choose a monitor that has an arm cuff.  Choose a cuff that wraps snugly around your upper arm. You should be able to fit only one finger between your arm and the cuff.  Do not choose a monitor that measures your blood pressure from your wrist or finger. Your health care provider can suggest a reliable monitor that will meet your needs. How to prepare To get the most accurate reading, avoid the following for 30 minutes before you check your blood pressure:  Drinking caffeine.  Drinking alcohol.  Eating.  Smoking.  Exercising. Five minutes before you check  your blood pressure:  Empty your bladder.  Sit quietly without talking in a dining chair, rather than in a soft couch or armchair. How to take your blood pressure To check your blood pressure, follow the instructions in the manual that came with your blood pressure monitor. If you have a digital blood pressure monitor, the instructions may be as follows: 1. Sit up straight. 2. Place your feet on the floor. Do not cross your ankles or legs. 3. Rest your left arm at the level of your heart on a table or desk or on the arm of a chair. 4. Pull up your shirt sleeve. 5. Wrap the blood pressure cuff around the upper part of your left arm, 1 inch (2.5 cm) above your elbow. It is best to wrap the cuff around bare skin. 6. Fit the cuff snugly around your arm. You should be able to place only one finger between the cuff and your arm. 7. Position the cord inside the groove of your elbow. 8. Press the power button. 9. Sit quietly while the cuff inflates and deflates. 10. Read the digital reading on the monitor screen and write it down (record it). 11. Wait 2-3 minutes, then repeat the steps, starting at step 1. What does my blood pressure reading mean? A blood pressure reading consists of  a higher number over a lower number. Ideally, your blood pressure should be below 120/80. The first ("top") number is called the systolic pressure. It is a measure of the pressure in your arteries as your heart beats. The second ("bottom") number is called the diastolic pressure. It is a measure of the pressure in your arteries as the heart relaxes. Blood pressure is classified into four stages. The following are the stages for adults who do not have a short-term serious illness or a chronic condition. Systolic pressure and diastolic pressure are measured in a unit called mm Hg. Normal  Systolic pressure: below 688.  Diastolic pressure: below 80. Elevated  Systolic pressure: 648-472.  Diastolic pressure: below  80. Hypertension stage 1  Systolic pressure: 072-182.  Diastolic pressure: 88-33. Hypertension stage 2  Systolic pressure: 744 or above.  Diastolic pressure: 90 or above. You can have prehypertension or hypertension even if only the systolic or only the diastolic number in your reading is higher than normal. Follow these instructions at home:  Check your blood pressure as often as recommended by your health care provider.  Take your monitor to the next appointment with your health care provider to make sure: ? That you are using it correctly. ? That it provides accurate readings.  Be sure you understand what your goal blood pressure numbers are.  Tell your health care provider if you are having any side effects from blood pressure medicine. Contact a health care provider if:  Your blood pressure is consistently high. Get help right away if:  Your systolic blood pressure is higher than 180.  Your diastolic blood pressure is higher than 110. This information is not intended to replace advice given to you by your health care provider. Make sure you discuss any questions you have with your health care provider. Document Revised: 06/28/2017 Document Reviewed: 12/23/2015 Elsevier Patient Education  2020 [employer].

## 2020-06-27 NOTE — Chronic Care Management (AMB) (Signed)
Chronic Care Management Pharmacy  Name: [name]  MRN: [id number] DOB: 30-Sep-1940   Chief Complaint/ HPI  [name],  79 y.o. , female presents for their Initial CCM visit with the clinical pharmacist via telephone due to COVID-19 Pandemic.  PCP : [name], MD Patient Care Team: [name], MD as PCP - General Jettie Booze, MD as PCP - Cardiology (Cardiology) Charlton Haws, [facility] as Pharmacist (Pharmacist)  Their chronic conditions include: Hypertension, Hyperlipidemia, Atrial Fibrillation, Heart Failure, GERD, COPD, Chronic Kidney Disease, Osteopenia and Gout, anemia  Patient lives with her husband.  Office Visits: 05/23/20 Dr Ronnald Ramp OV: hospital f/u GI bleed. Anemia improved, iron is low, rec'd iron infusions. Continue Breo ellipta for chronic bronchitis. Increased metoprolol d/t BP 186/68. Referred to GI for ulcerative colitis.  02/04/20 Dr Ronnald Ramp OV: acute visit for BRBPR. Advised no transfusion ue to only slight decrease in H/H and no bleeding x 4 days. Ordered CT scan to evaluate hernia. Rx'd pitavastatin.  Consult Visit: 05/07/20 - 05/12/20 hospital admission: PMH recurrent GI bleeds (AVM). Received 2 units PRBC  02/15/20 Dr Allred (cardiology): EP f/u, bradycardia. Stopped metoprolol. Noncompliant with CPAP.   Allergies  Allergen Reactions  . Amoxicillin Diarrhea    DID THE REACTION INVOLVE: Swelling of the face/tongue/throat, SOB, or low BP? N Sudden or severe rash/hives, skin peeling, or the inside of the mouth or nose? N Did it require medical treatment? N When did it last happen?MORE THAN 10 YEARS If all above answers are "NO", may proceed with cephalosporin use.   . Celebrex [Celecoxib]     edema  . Enalapril Cough  . Lipitor [Atorvastatin]     Muscle aches  . Amlodipine Besylate     REACTION: edema  . Codeine Sulfate     REACTION: Nausea  . Hydrocodone-Acetaminophen     REACTION: Nausea     Medications: Outpatient Encounter Medications as of 06/27/2020  Medication Sig Note  . acetaminophen (TYLENOL) 325 MG tablet Take 325-650 mg by mouth daily as needed for moderate pain, fever or headache.    . albuterol (PROAIR HFA) 108 (90 Base) MCG/ACT inhaler Inhale 1-2 puffs into the lungs every 6 (six) hours as needed for wheezing or shortness of breath.   . allopurinol (ZYLOPRIM) 100 MG tablet Take 1 tablet (100 mg total) by mouth daily.   . ferrous sulfate 325 (65 FE) MG tablet Take 1 tablet (325 mg total) by mouth 2 (two) times daily with a meal.   . fluticasone furoate-vilanterol (BREO ELLIPTA) 100-25 MCG/INH AEPB INHALE 1 PUFF INTO THE LUNGS DAILY   . isosorbide-hydrALAZINE (BIDIL) 20-37.5 MG tablet Take 1 tablet by mouth 3 (three) times daily. 06/27/2020: Takes BID due to cost  . metoprolol tartrate (LOPRESSOR) 25 MG tablet Take 1 tablet (25 mg total) by mouth 2 (two) times daily. (Patient taking differently: Take 12.5 mg by mouth 2 (two) times daily. )   . torsemide (DEMADEX) 20 MG tablet TAKE 1 TABLET(20 MG) BY MOUTH TWICE DAILY (Patient taking differently: Take 40 mg by mouth daily. )   . [DISCONTINUED] allopurinol (ZYLOPRIM) 100 MG tablet One po QD   . [DISCONTINUED] Pitavastatin Calcium 1 MG TABS Take 1 tablet (1 mg total) by mouth daily. (Patient not taking: Reported on 06/27/2020)   . [DISCONTINUED] torsemide (DEMADEX) 20 MG tablet TAKE 1 TABLET(20 MG) BY MOUTH TWICE DAILY   . [DISCONTINUED] torsemide (DEMADEX) 20 MG tablet TAKE 1 TABLET(20 MG) BY MOUTH TWICE DAILY  No facility-administered encounter medications on file as of 06/27/2020.    Wt Readings from Last 3 Encounters:  05/23/20 196 lb (88.9 kg)  05/07/20 196 lb (88.9 kg)  02/19/20 (!) 205 lb (93 kg)    Current Diagnosis/Assessment:  SDOH Interventions     Most Recent Value  SDOH Interventions  Financial Strain Interventions Other (Comment)  [PAP for Breo, Bidil. Switch Livalo to more affordable statin]       Goals Addressed            This Visit's Progress   . Pharmacy Care Plan       CARE PLAN ENTRY (see longitudinal plan of care for additional care plan information)  Current Barriers:  . Chronic Disease Management support, education, and care coordination needs related to Hypertension, Hyperlipidemia, Atrial Fibrillation, Heart Failure, and COPD   Hypertension / Heart Failure BP Readings from Last 3 Encounters:  05/23/20 (!) 186/68  05/12/20 (!) 171/52  03/08/20 (!) 142/41 .  Pharmacist Clinical Goal(s): o Over the next 30 days, patient will work with PharmD and providers to achieve BP goal <140/90 . Current regimen:  o Metoprolol tartrate 12.5 mg twice a day o Torsemide 20 mg - 2 tablets in AM o Isosorbide-hydralazine 20-37.5 mg 2 times daily . Interventions: o Discussed BP goals and benefits of medications for prevention of heart attack / stroke o Advised to check BP daily x 2-3 weeks to ascertain baseline BP . Patient self care activities - Over the next 30 days, patient will: o Check BP daily, document, and provide at future appointments o Ensure daily salt intake < 2300 mg/day  Hyperlipidemia Lab Results  Component Value Date/Time   LDLCALC 90 02/04/2020 02:43 PM   LDLDIRECT 107.0 09/04/2016 03:53 PM .  Pharmacist Clinical Goal(s): o Over the next 30 days, patient will work with PharmD and providers to maintain LDL goal < 100 . Current regimen:  o No medications . Interventions: o Discussed cholesterol goals and benefits of medications for prevention of heart attack / stroke o Discussed patient tolerated Livalo 1 mg but could not continue due to cost o Recommend rosuvastatin 5 mg daily . Patient self care activities - Over the next 30 days, patient will: o Take medication as prescribed  COPD . Pharmacist Clinical Goal(s) o Over the next 180 days, patient will work with PharmD and providers to optimize therapy . Current regimen:  o Breo Ellipta 100-25  mcg/inh 1 puff daily o Albuterol HFA prn . Interventions: o Pursue patient assistance for Breo in 2022 . Patient self care activities - Over the next 180 days, patient will: o Continue current medications  Medication management . Pharmacist Clinical Goal(s): o Over the next 30 days, patient will work with PharmD and providers to maintain optimal medication adherence . Current pharmacy: [facility] . Interventions o Comprehensive medication review performed. o Continue current medication management strategy . Patient self care activities - Over the next 30 days, patient will: o Focus on medication adherence by fill date o Take medications as prescribed o Report any questions or concerns to PharmD and/or provider(s)  Initial goal documentation       Heart Failure / Hypertension   Type: Diastolic  Last ejection fraction: 60-65% (05/11/2020) NYHA Class: III (marked limitation of activity)  BP goal is:  <140/90 BP Readings from Last 3 Encounters:  05/23/20 (!) 186/68  05/12/20 (!) 171/52  03/08/20 (!) 142/41   Lab Results  Component Value Date   CREATININE 1.64 (H) 05/12/2020  BUN 21 05/12/2020   GFR 37.48 (L) 11/12/2019   GFRNONAA 29 (L) 05/12/2020   GFRAA 30 (L) 02/19/2020   NA 136 05/12/2020   K 3.8 05/12/2020   CALCIUM 8.8 (L) 05/12/2020   CO2 28 05/12/2020   Patient checks BP at home 1-2x per week   Patient home BP readings are ranging: SBP 180-200  Patient has failed these meds in past: n/a Patient is currently controlled on the following medications:  Marland Kitchen Metoprolol tartrate 12.5 mg BID . Torsemide 20 mg - 2 in morning . Isosorbide-hydralazine 20-37.5 mg BID  We discussed: BP goals; benefits of medications;  -pt is taking lower doses than prescribed of metoprolol - she has been on 25 mg BID previously and had issues with bradycardia so her cardiologist decreased it to 12.5 mg BID. She did not start taking higher dose of metoprolol when instructed by PCP in  s/o high BP -pt is also taking lower dose than prescribed of Bidil - she is taking it BID rather than TID due to medication cost, trying to "stretch it"; discussed option to split Bidil into generic components for cost savings; she may qualify for Bidil Patient assistance as well -advised pt check BP at home for 2-3 weeks to establish baseline, medication adjustments likely will be necessary  Plan  Continue current medications and control with diet and exercise  Review Bidil PAP requirements Pt to check BP at home daily F/u call in 2-3 weeks for home readings/medication adjustment   AFIB   Hx recurrent GI bleeds - no anticoagulation  Patient is currently rate controlled. Office heart rates are  Pulse Readings from Last 3 Encounters:  05/23/20 80  05/12/20 71  03/08/20 61   CHA2DS2-VASc Score = 5  The patient's score is based upon: CHF History: 1 HTN History: 1 Diabetes History: 0 Stroke History: 0 Vascular Disease History: 0 Age Score: 2 Gender Score: 1  Patient has failed these meds in past: n/a Patient is currently controlled on the following medications:  Marland Kitchen Metoprolol tartrate 25 mg 1/2 tab BID  We discussed: pt denies issues with Afib recently; she reports HR is well controlled on current dose of metoprolol (previously she had bradycardia with 25 mg BID); she is also not a candidate for anticoagulation due to hx of recurrent GI bleeding   Plan  Continue current medications  Hyperlipidemia   LDL goal < 100  Last lipids Lab Results  Component Value Date   CHOL 165 02/04/2020   HDL 49 (L) 02/04/2020   LDLCALC 90 02/04/2020   LDLDIRECT 107.0 09/04/2016   TRIG 162 (H) 02/04/2020   CHOLHDL 3.4 02/04/2020   Hepatic Function Latest Ref Rng & Units 05/08/2020 05/07/2020 02/19/2020  Total Protein 6.5 - 8.1 g/dL 6.0(L) 6.6 7.1  Albumin 3.5 - 5.0 g/dL 3.1(L) 3.4(L) 3.6  AST 15 - 41 U/L 12(L) 13(L) 14(L)  ALT 0 - 44 U/L 9 10 10   Alk Phosphatase 38 - 126 U/L 45 51 59   Total Bilirubin 0.3 - 1.2 mg/dL 1.0 0.4 <0.1(L)  Bilirubin, Direct 0.0 - 0.3 mg/dL - - -    The 10-year ASCVD risk score Mikey Bussing DC Jr., et al., 2013) is: 78.2%   Values used to calculate the score:     Age: 7 years     Sex: Female     Is Non-Hispanic African American: No     Diabetic: Yes     Tobacco smoker: No     Systolic Blood Pressure:  186 mmHg     Is BP treated: Yes     HDL Cholesterol: 49 mg/dL     Total Cholesterol: 165 mg/dL   Patient has failed these meds in past: atorvastatin (cramps), pitavastatin (cost) Patient is currently uncontrolled on the following medications:  . No medication  We discussed:  diet and exercise extensively; Cholesterol goals; benefits of statin for ASCVD risk reduction; pt reports she stopped taking Livalo after the rx ran out (around October) due to cost of refill being too high; pt is willing to take another statin if it is affordable  Plan  Recommend rosuvastatin 5 mg daily  COPD   Last spirometry score: not on file  No flowsheet data found. Lab Results  Component Value Date/Time   EOSPCT 1.8 05/23/2020 02:30 PM   EOSPCT 3.2 04/05/2015 01:02 PM   EOSABS 0.2 05/23/2020 02:30 PM   EOSABS 0.4 04/05/2015 01:02 PM   Patient has failed these meds in past: n/a Patient is currently controlled on the following medications:  . Breo Ellipta 100-25 mcg/inh 1 puff daily . Albuterol nebulizer . Albuterol HFA prn . 2 L O2  Using maintenance inhaler regularly? Yes Frequency of rescue inhaler use:  infrequently  We discussed:  proper inhaler technique; pt reports albuterol inhaler is expensive so she does not use it; she also reports little need for it; she has received Breo through [place] PAP before, but not this year. She will have to wait until donut hole to qualify for 2022  Plan  Continue current medications  Pursue PAP for Breo ~summer 2022  Anemia   Hx recurrent GI bleeds. Has had iron infusions in the past.  CBC Latest Ref Rng &  Units 05/23/2020 05/12/2020 05/11/2020  WBC 4.0 - 10.5 K/uL 10.4 11.7(H) -  Hemoglobin 12.0 - 15.0 g/dL 9.4(L) 9.1(L) 8.9(L)  Hematocrit 36 - 46 % 28.7(L) 29.2(L) 27.3(L)  Platelets 150 - 400 K/uL 281.0 195 -   Iron/TIBC/Ferritin/ %Sat    Component Value Date/Time   IRON 37 (L) 05/23/2020 1430   IRON 71 04/11/2016 1007   TIBC 277 08/14/2018 0436   TIBC 253 04/11/2016 1007   FERRITIN 53.2 05/23/2020 1430   IRONPCTSAT 0.5 (L) 11/12/2019 1702   IRONPCTSAT 28 04/11/2016 1007   Patient has failed these meds in past: n/a Patient is currently controlled on the following medications:  . Ferrous sulfate 325 mg BID  We discussed:  Patient is satisfied with current regimen and denies issues  Plan  Continue current medications  Gout    Ref. Range 02/23/2016 16:18 08/13/2017 15:31  Uric Acid, Serum Latest Ref Range: 2.3 - 6.6 mg/dL 9.3 (H) 11.6 (H)   Patient has failed these meds in past: n/a Patient is currently controlled on the following medications:  . Allopurinol 100 mg daily  We discussed:  Pt reports last gout flare was several years ago; denies issues with medication  Plan  Continue current medications  Pain   Patient has failed these meds in past: n/a Patient is currently controlled on the following medications:  . Tylenol 325 mg PRN . Vitamin B12   We discussed:  Patient is satisfied with current regimen and denies issues  Plan  Continue current medications  Vaccines   Reviewed and discussed patient's vaccination history.    Immunization History  Administered Date(s) Administered  . Fluad Quad(high Dose 65+) 05/23/2020  . Influenza Split 04/13/2014, 06/05/2018  . Influenza Whole 05/30/2000, 05/12/2009, 04/29/2010  . Influenza, High Dose Seasonal PF 03/29/2016,  04/29/2017, 05/19/2019  . Influenza,inj,Quad PF,6+ Mos 06/02/2013, 04/22/2014, 04/18/2015  . PFIZER SARS-COV-2 Vaccination 12/19/2019, 01/09/2020  . Pneumococcal Conjugate-13 02/05/2013  .  Pneumococcal Polysaccharide-23 11/07/2006, 10/19/2017  . Td 10/30/2001  . Tdap 09/25/2011    Plan  Recommended patient receive Shingrix vaccine    Medication Management   Patient's preferred pharmacy is:  [facility] [place], [place] [address] AT [address] [place] [address] Phone: [phone] Fax: [phone]  Uses pill box? No - prefers bottles Pt endorses 99% compliance   We discussed: Discussed benefits of medication synchronization, packaging and delivery as well as enhanced pharmacist oversight with [employer]. Patient will think about it and let me know  Plan  Continue current medication management strategy    Follow up: 1 month phone visit  Charlene Brooke, PharmD, BCACP Clinical Pharmacist [facility] [phone]

## 2020-06-27 NOTE — Addendum Note (Signed)
Addended by: Hinda Kehr on: 06/27/2020 08:28 AM   Modules accepted: Orders

## 2020-07-19 ENCOUNTER — Telehealth: Payer: Medicare Other

## 2020-07-19 NOTE — Chronic Care Management (AMB) (Deleted)
Chronic Care Management Pharmacy  Name: [name]  MRN: [id number] DOB: [birth year]   Chief Complaint/ HPI  [name],  79 y.o. , female presents for their Initial CCM visit with the clinical pharmacist via telephone due to COVID-19 Pandemic.  PCP : [name], MD Patient Care Team: [name], MD as PCP - General Jettie Booze, MD as PCP - Cardiology (Cardiology) Charlton Haws, [facility] as Pharmacist (Pharmacist)  Their chronic conditions include: Hypertension, Hyperlipidemia, Atrial Fibrillation, Heart Failure, GERD, COPD, Chronic Kidney Disease, Osteopenia and Gout, anemia  Patient lives with her husband.  Office Visits: 05/23/20 Dr Ronnald Ramp OV: hospital f/u GI bleed. Anemia improved, iron is low, rec'd iron infusions. Continue Breo ellipta for chronic bronchitis. Increased metoprolol d/t BP 186/68. Referred to GI for ulcerative colitis.  02/04/20 Dr Ronnald Ramp OV: acute visit for BRBPR. Advised no transfusion ue to only slight decrease in H/H and no bleeding x 4 days. Ordered CT scan to evaluate hernia. Rx'd pitavastatin.  Consult Visit: 05/07/20 - 05/12/20 hospital admission: PMH recurrent GI bleeds (AVM). Received 2 units PRBC  02/15/20 Dr Allred (cardiology): EP f/u, bradycardia. Stopped metoprolol. Noncompliant with CPAP.   Allergies  Allergen Reactions  . Amoxicillin Diarrhea    DID THE REACTION INVOLVE: Swelling of the face/tongue/throat, SOB, or low BP? N Sudden or severe rash/hives, skin peeling, or the inside of the mouth or nose? N Did it require medical treatment? N When did it last happen?MORE THAN 10 YEARS If all above answers are "NO", may proceed with cephalosporin use.   . Celebrex [Celecoxib]     edema  . Enalapril Cough  . Lipitor [Atorvastatin]     Muscle aches  . Amlodipine Besylate     REACTION: edema  . Codeine Sulfate     REACTION: Nausea  . Hydrocodone-Acetaminophen     REACTION: Nausea     Medications: Outpatient Encounter Medications as of 07/19/2020  Medication Sig Note  . acetaminophen (TYLENOL) 325 MG tablet Take 325-650 mg by mouth daily as needed for moderate pain, fever or headache.    . albuterol (PROAIR HFA) 108 (90 Base) MCG/ACT inhaler Inhale 1-2 puffs into the lungs every 6 (six) hours as needed for wheezing or shortness of breath.   . allopurinol (ZYLOPRIM) 100 MG tablet Take 1 tablet (100 mg total) by mouth daily.   . ferrous sulfate 325 (65 FE) MG tablet Take 1 tablet (325 mg total) by mouth 2 (two) times daily with a meal.   . fluticasone furoate-vilanterol (BREO ELLIPTA) 100-25 MCG/INH AEPB INHALE 1 PUFF INTO THE LUNGS DAILY   . isosorbide-hydrALAZINE (BIDIL) 20-37.5 MG tablet Take 1 tablet by mouth 3 (three) times daily. 06/27/2020: Takes BID due to cost  . metoprolol tartrate (LOPRESSOR) 25 MG tablet Take 1 tablet (25 mg total) by mouth 2 (two) times daily. (Patient taking differently: Take 12.5 mg by mouth 2 (two) times daily. )   . Pitavastatin Magnesium (ZYPITAMAG) 2 MG TABS Take 1 tablet by mouth daily.   Marland Kitchen torsemide (DEMADEX) 20 MG tablet TAKE 1 TABLET(20 MG) BY MOUTH TWICE DAILY (Patient taking differently: Take 40 mg by mouth daily. )   . [DISCONTINUED] allopurinol (ZYLOPRIM) 100 MG tablet One po QD   . [DISCONTINUED] torsemide (DEMADEX) 20 MG tablet TAKE 1 TABLET(20 MG) BY MOUTH TWICE DAILY   . [DISCONTINUED] torsemide (DEMADEX) 20 MG tablet TAKE 1 TABLET(20 MG) BY MOUTH TWICE DAILY    No facility-administered encounter medications on file  as of 07/19/2020.    Wt Readings from Last 3 Encounters:  05/23/20 196 lb (88.9 kg)  05/07/20 196 lb (88.9 kg)  02/19/20 (!) 205 lb (93 kg)   Lab Results  Component Value Date   CREATININE 1.64 (H) 05/12/2020   BUN 21 05/12/2020   GFR 37.48 (L) 11/12/2019   GFRNONAA 29 (L) 05/12/2020   GFRAA 30 (L) 02/19/2020   NA 136 05/12/2020   K 3.8 05/12/2020   CALCIUM 8.8 (L) 05/12/2020   CO2 28 05/12/2020     Current Diagnosis/Assessment:    Goals Addressed   None     Heart Failure / Hypertension   Type: Diastolic  Last ejection fraction: 60-65% (05/11/2020) NYHA Class: III (marked limitation of activity)  BP goal is:  <140/90 BP Readings from Last 3 Encounters:  05/23/20 (!) 186/68  05/12/20 (!) 171/52  03/08/20 (!) 142/41   Patient checks BP at home 1-2x per week   Patient home BP readings are ranging: SBP 180-200  Patient has failed these meds in past: n/a Patient is currently controlled on the following medications:  Marland Kitchen Metoprolol tartrate 12.5 mg BID . Torsemide 20 mg - 2 in morning . Isosorbide-hydralazine (BIDIL) 20-37.5 mg BID  We discussed: BP goals; benefits of medications;  -pt is taking lower doses than prescribed of metoprolol - she has been on 25 mg BID previously and had issues with bradycardia so her cardiologist decreased it to 12.5 mg BID. She did not start taking higher dose of metoprolol when instructed by PCP in s/o high BP -pt is also taking lower dose than prescribed of Bidil - she is taking it BID rather than TID due to medication cost, trying to "stretch it"; discussed option to split Bidil into generic components for cost savings; she may qualify for Bidil Patient assistance as well -advised pt check BP at home for 2-3 weeks to establish baseline, medication adjustments likely will be necessary  Plan  Continue current medications and control with diet and exercise  Review Bidil PAP requirements Pt to check BP at home daily F/u call in 2-3 weeks for home readings/medication adjustment   AFIB   Hx recurrent GI bleeds - no anticoagulation  Patient is currently rate controlled. Office heart rates are  Pulse Readings from Last 3 Encounters:  05/23/20 80  05/12/20 71  03/08/20 61   CHA2DS2-VASc Score = 5  The patient's score is based upon: CHF History: Yes HTN History: Yes Diabetes History: No Stroke History: No Vascular Disease History:  No Age Score: 2 Gender Score: 1  Patient has failed these meds in past: n/a Patient is currently controlled on the following medications:  Marland Kitchen Metoprolol tartrate 25 mg 1/2 tab BID  We discussed: pt denies issues with Afib recently; she reports HR is well controlled on current dose of metoprolol (previously she had bradycardia with 25 mg BID); she is also not a candidate for anticoagulation due to hx of recurrent GI bleeding   Plan  Continue current medications  Hyperlipidemia   LDL goal < 100  Last lipids Lab Results  Component Value Date   CHOL 165 02/04/2020   HDL 49 (L) 02/04/2020   LDLCALC 90 02/04/2020   LDLDIRECT 107.0 09/04/2016   TRIG 162 (H) 02/04/2020   CHOLHDL 3.4 02/04/2020   Hepatic Function Latest Ref Rng & Units 05/08/2020 05/07/2020 02/19/2020  Total Protein 6.5 - 8.1 g/dL 6.0(L) 6.6 7.1  Albumin 3.5 - 5.0 g/dL 3.1(L) 3.4(L) 3.6  AST 15 -  41 U/L 12(L) 13(L) 14(L)  ALT 0 - 44 U/L _0 Alk Phosphatase 38 - 126 U/L 45 51 59  Total Bilirubin 0.3 - 1.2 mg/dL 1.0 0.4 <0.1(L)  Bilirubin, Direct 0.0 - 0.3 mg/dL - - -    The ASCVD Risk score Mikey Bussing DC Jr., et al., 2013) failed to calculate for the following reasons:   The 2013 ASCVD risk score is only valid for ages 26 to 26   Patient has failed these meds in past: atorvastatin (cramps), pitavastatin (cost) Patient is currently uncontrolled on the following medications:  Marland Kitchen Zypitamag (pitavastatin) 2 mg daily ([facility])  We discussed:  diet and exercise extensively; Cholesterol goals; benefits of statin for ASCVD risk reduction; pt reports she stopped taking Livalo after the rx ran out (around October) due to cost of refill being too high; pt is willing to take another statin if it is affordable  Plan  Recommend rosuvastatin 5 mg daily  COPD   Last spirometry score: not on file  No flowsheet data found. Lab Results  Component Value Date/Time   EOSPCT 1.8 05/23/2020 02:30 PM   EOSPCT 3.2 04/05/2015  01:02 PM   EOSABS 0.2 05/23/2020 02:30 PM   EOSABS 0.4 04/05/2015 01:02 PM   Patient has failed these meds in past: n/a Patient is currently controlled on the following medications:  . Breo Ellipta 100-25 mcg/inh 1 puff daily . Albuterol nebulizer . Albuterol HFA prn . 2 L O2  Using maintenance inhaler regularly? Yes Frequency of rescue inhaler use:  infrequently  We discussed:  proper inhaler technique; pt reports albuterol inhaler is expensive so she does not use it; she also reports little need for it; she has received Breo through [facility] before, but not this year. She will have to wait until donut hole to qualify for 2022  Plan  Continue current medications  Pursue PAP for Breo ~summer 2022  Anemia   Hx recurrent GI bleeds. Has had iron infusions in the past.  CBC Latest Ref Rng & Units 05/23/2020 05/12/2020 05/11/2020  WBC 4.0 - 10.5 K/uL 10.4 11.7(H) -  Hemoglobin 12.0 - 15.0 g/dL 9.4(L) 9.1(L) 8.9(L)  Hematocrit 36.0 - 46.0 % 28.7(L) 29.2(L) 27.3(L)  Platelets 150.0 - 400.0 K/uL 281.0 195 -   Iron/TIBC/Ferritin/ %Sat    Component Value Date/Time   IRON 37 (L) 05/23/2020 1430   IRON 71 04/11/2016 1007   TIBC 277 08/14/2018 0436   TIBC 253 04/11/2016 1007   FERRITIN 53.2 05/23/2020 1430   IRONPCTSAT 0.5 (L) 11/12/2019 1702   IRONPCTSAT 28 04/11/2016 1007   Patient has failed these meds in past: n/a Patient is currently controlled on the following medications:  . Ferrous sulfate 325 mg BID  We discussed:  Patient is satisfied with current regimen and denies issues  Plan  Continue current medications  Gout    Ref. Range 02/23/2016 16:18 08/13/2017 15:31  Uric Acid, Serum Latest Ref Range: 2.3 - 6.6 mg/dL 9.3 (H) 11.6 (H)   Patient has failed these meds in past: n/a Patient is currently controlled on the following medications:  . Allopurinol 100 mg daily  We discussed:  Pt reports last gout flare was several years ago; denies issues with  medication  Plan  Continue current medications  Pain   Patient has failed these meds in past: n/a Patient is currently controlled on the following medications:  . Tylenol 325 mg PRN . Vitamin B12   We discussed:  Patient is satisfied  with current regimen and denies issues  Plan  Continue current medications  Vaccines   Reviewed and discussed patient's vaccination history.    Immunization History  Administered Date(s) Administered  . Fluad Quad(high Dose 65+) 05/23/2020  . Influenza Split 04/13/2014, 06/05/2018  . Influenza Whole 05/30/2000, 05/12/2009, 04/29/2010  . Influenza, High Dose Seasonal PF 03/29/2016, 04/29/2017, 05/19/2019  . Influenza,inj,Quad PF,6+ Mos 06/02/2013, 04/22/2014, 04/18/2015  . PFIZER SARS-COV-2 Vaccination 12/19/2019, 01/09/2020  . Pneumococcal Conjugate-13 02/05/2013  . Pneumococcal Polysaccharide-23 11/07/2006, 10/19/2017  . Td 10/30/2001  . Tdap 09/25/2011    Plan  Recommended patient receive Shingrix vaccine    Medication Management   Patient's preferred pharmacy is:  [facility] [place], [place] - [address] AT [address] [place] [address] Phone: [phone] Fax: [place], [place] [address] Phone: [phone] Fax: [phone]  Uses pill box? No - prefers bottles Pt endorses 99% compliance   We discussed: Discussed benefits of medication synchronization, packaging and delivery as well as enhanced pharmacist oversight with [employer]. Patient will think about it and let me know  Plan  Continue current medication management strategy    Follow up: *** month phone visit  Charlene Brooke, PharmD, BCACP Clinical Pharmacist [facility] [phone]

## 2020-07-24 DIAGNOSIS — J449 Chronic obstructive pulmonary disease, unspecified: Secondary | ICD-10-CM | POA: Diagnosis not present

## 2020-07-24 DIAGNOSIS — L03119 Cellulitis of unspecified part of limb: Secondary | ICD-10-CM | POA: Diagnosis not present

## 2020-07-27 ENCOUNTER — Other Ambulatory Visit: Payer: Self-pay | Admitting: Internal Medicine

## 2020-07-27 DIAGNOSIS — I5042 Chronic combined systolic (congestive) and diastolic (congestive) heart failure: Secondary | ICD-10-CM

## 2020-07-27 DIAGNOSIS — I10 Essential (primary) hypertension: Secondary | ICD-10-CM

## 2020-08-03 ENCOUNTER — Other Ambulatory Visit: Payer: Self-pay

## 2020-08-03 ENCOUNTER — Encounter: Payer: Self-pay | Admitting: Medical

## 2020-08-03 ENCOUNTER — Telehealth (INDEPENDENT_AMBULATORY_CARE_PROVIDER_SITE_OTHER): Payer: Medicare Other | Admitting: Medical

## 2020-08-03 VITALS — BP 166/62 | HR 73 | Temp 95.5°F | Wt 183.4 lb

## 2020-08-03 DIAGNOSIS — R059 Cough, unspecified: Secondary | ICD-10-CM | POA: Diagnosis not present

## 2020-08-03 DIAGNOSIS — R109 Unspecified abdominal pain: Secondary | ICD-10-CM

## 2020-08-03 DIAGNOSIS — R197 Diarrhea, unspecified: Secondary | ICD-10-CM

## 2020-08-03 MED ORDER — CEFDINIR 300 MG PO CAPS: 300.0000 mg | ORAL_CAPSULE | Freq: Two times a day (BID) | ORAL | 0 refills | Status: DC

## 2020-08-03 MED ORDER — METRONIDAZOLE 500 MG PO TABS: 500.0000 mg | ORAL_TABLET | Freq: Three times a day (TID) | ORAL | 0 refills | Status: DC
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
## 2020-08-03 NOTE — Progress Notes (Signed)
Subjective:    Patient ID: [name], female    DOB: Nov 29, 1940, 80 y.o.   MRN: [id number]  HPI  Virtual Visit via Video Note  I connected with [name] on 08/03/20 at  3:40 PM EST by a video enabled telemedicine application and verified that I am speaking with the correct person using two identifiers.  Location: Patient: home Provider: office  particpants- pt, grandaughter.   I discussed the limitations of evaluation and management by telemedicine and the availability of in person appointments. The patient expressed understanding and agreed to proceed.  History of Present Illness:  Pt scheduled to see me by virtual visit.  80 year old. Sheduled virtual with me.   Last October hospitalization below.  Hospital Course: [name] is a 80 yo CF with PMH recurreng GIBs (AVMs), HTN, CKDIII, COPD (2L chronic O2), afib (no AC 2/2 hx GIB), dCHF (EF 60-65%, Gr2DD on 07/2017 echo) who presented to the hospital with recurrent rectal bleeding, fatigue, and lightheadedness.  Her Hgb on admission was 6.3 g/dL and she received a total of 2 units PRBC after admission.  Due to a large underlying umbilical hernia she has been unable to undergo full CLN beyond transverse colon (and has history of cecal AVMs on CLN 2016). Due to underlying co-morbidities she is undergoing cardiac evaluation for clearance in case of possible need for surgery of hernia repair.   After all evaluations, the patient had voiced significant apprehension with undergoing surgery that would require intubation as she was worried about coming off of the ventilator.  Due to her underlying lung disease this was an increased risk of surgery.  She was cleared from a cardiac standpoint after cardiology evaluation however she remained concerned from a lung standpoint.  Her hemoglobin remained stable and she had no further episodes of rectal bleeding.  She had no immediate indication for urgent/emergent surgery.   She was  therefore considered stable for discharging home and will have to have ongoing discussions outpatient regarding her wishes if recurrent bleed and need for surgery in the future.   Acute GI bleeding  - long standing history of recurrent GIB. History of cecal AVMs on CLN on 0962; now with umbilical hernia, unable to evaluate colon past transverse colon on CLN - now s/p 2 units PRBC for admission - no further rectal bleeding reported; Hgb still labile - continue trending - further plans regarding hernia repair vs conservative management are ongoing; appreciate GI, cardiology, and surgery input on plan of care  -No further bleeding and hemoglobin stable at 9.1 g/dL on day of discharge.  No further intervention indicated at this time and patient was discharged home  CHF (congestive heart failure), NYHA class III, chronic, diastolic (HCC) - no s/s exacerbation at this time - last echo 2019 reviewed; EF 60-65%, Gr 2 DD -Repeated this hospitalization showing similar EF, 60 to 65% and grade 2 diastolic dysfunction -Cardiology following, appreciate assistance -She was cleared from a cardiovascular standpoint.  If readmitted in the future, reconsult cardiology for repeat clearance/reevaluation  Chronic kidney disease, stage 3b ([place]) - Baseline creatinine approximately 1.4 - trend BMP; avoid nephrotoxic agents as able   AVM (arteriovenous malformation) of small bowel, acquired with hemorrhage - see GIB  Atrial fibrillation ([place]) - not on anticoagulation due to history of AVMs and GIBs - continue metoprolol   COLD (chronic obstructive lung disease) ([place]) -On 2 L nasal cannula chronically -Patient has a lot of concern about being intubated for  potential surgery and coming off of the ventilator given her underlying lung disease  Anemia, iron deficiency - continue oral iron at d/c   Chronic venous stasis dermatitis of both lower extremities - stable   HTN (hypertension) -Continue  current regimen in place  Gout -Continue allopurinol    The patient's chronic medical conditions were treated accordingly per the patient's home medication regimen except as noted.  On day of discharge, patient was felt deemed stable for discharge. Patient/family member advised to call PCP or come back to ER if needed.   Above from last hospital dc.    Pt has had some diarrhea since last week. End of last week. No loose stools today. But had 3 loose stools last night. Pt states drinking water. Ate piece of toast and eating ritz crackers. No antibiotic in past 2-3 weeks. Pt states can ambulate. More tired than usual. Grandaughter states [name] does seem extremely weak. She cannot walk across the house due to fatigue. She has to support hernia/abdomen due to pain when ambuating.  Pt has known hernia. The hernia region has been hurting more the last 2 weeks.  Pt also developed cough on Sunday. She is coughing up phlem. Some pnd.  I went into detail on my concerns.   Observations/Objective:  General-appears tired Lungs- on inspection lungs appear unlabored. Neck- no tracheal deviation or jvd on inspection. Neuro- gross motor function appears intact. Abdomen- large tender hernia. Red appearance to skin That is very tender and feels warm to touch. So painful pt won't touch area herself or others   Assessment and Plan:  New patient to me with severe multiple medical problems/comorbidities.  Among these are CHF, GI bleed, CKD, atrial fibrillation, COPD, hypertension and known large hernia.  It appears on chart review surgery had been recommended in the past but patient had significant apprehension with undergoing surgery.  Now most recently she presents with diarrhea, extreme fatigue and clinical concern per her description for dehydration.  She also coughs intermittently during the exam.  She does have known lung disease but in light of pandemic significant cough causes concern  for possible COVID.  On exam she has quite large protuberant hernia with reddish coloration to skin.  Granddaughter states that the area has felt very warm recently and area is so tender that she expresses concern that grandmother would slap her if she would palpate area even lightly.  Thus discussed concern for acute abdomen.   Have explained in detail that the best options under the circumstances would be to be evaluated at the emergency [facility] or [facility] ED.  [facility].  Option as well.  In addition mentioned in urgent care since family might get discouraged with potential long wait.  Also explained in light of circumstances calling 911 directly and being taken by EMS might expedite work-up/evaluation.  After long conversatin patient stated she did not want to be evaluated or go anywhere despite explaining severe potential complications.  Went into detail regarding incarcerated and strangulated hernias.  Very challenging situation and being forced to treat with no work up. Explained to pt will discuss with Dr. Charlett Blake and get back with them. Consider antibiotics. Advise hydrate well and bland foods.  Did talk with Dr. Charlett Blake and she agreed with me that patient needs to be evaluated in the emergency department.  In light of patient's refusal to be seen in the emergency department we did discuss patient using Augmentin and Flagyl combination for 2 days  provided patient and granddaughter agree to get stat labs with x-ray and lab [facility].  Placed order for future CMP and CBC as well as x-ray abdomen.  Also decided to go ahead and stool studies.  Advised patient and granddaughter in detail the best choice is ED evaluation but only advising outpatient antibiotic and labs since they refused ED evaluation.  Explained over the next day best to push fluids and hydrate with Gatorade.  For short-term until we can get labs back neck to eat solids.  Follow up by video with pcp or our  office. Sooner if needed.  If symptoms worsen then ED evaluation.  Note I am only giving 2 days of antibiotics and making clear to granddaughter and patient that we need to assess labs before prescribing full course.  Particularly knowing patient's kidney function is needed.  After reviewing allergy/side effect list decided to rx cefdnir as she report diarrhea with amoxicillin. Concern augmentin would make her current diarrhea worse   .  Mackie Pai, PA-C   Time spent with patient today was 40 +  minutes which consisted of chart revdiew, discussing diagnosis, work up treatment and documentation.  Follow Up Instructions:    I discussed the assessment and treatment plan with the patient. The patient was provided an opportunity to ask questions and all were answered. The patient agreed with the plan and demonstrated an understanding of the instructions.   The patient was advised to call back or seek an in-person evaluation if the symptoms worsen or if the condition fails to improve as anticipated.     Mackie Pai, PA-C   Review of Systems  Constitutional: Positive for fatigue. Negative for chills and fever.  HENT: Negative for congestion.   Respiratory: Positive for cough.   Cardiovascular: Negative for chest pain and palpitations.  Gastrointestinal: Positive for abdominal pain. Negative for constipation, diarrhea and nausea.       Decreased appetite.  Genitourinary: Negative for difficulty urinating, dysuria and hematuria.  Musculoskeletal: Negative for back pain.  Neurological: Negative for dizziness, speech difficulty, weakness, numbness and headaches.  Hematological: Negative for adenopathy. Does not bruise/bleed easily.  Psychiatric/Behavioral: Negative for behavioral problems and confusion.    Past Medical History:  Diagnosis Date  . Allergy   . Angiodysplasia of cecum   . Angiodysplasia of duodenum   . Arthritis   . Asteatotic eczema 02/25/2016  . Asthma   .  Atrial fibrillation ([place]) 10/17/2017  . Blood transfusion without reported diagnosis   . Cataract   . Chest pain at rest 03/22/2010   Qualifier: Diagnosis of  By: Ronnald Ramp MD, Arvid Right.   . CHF (congestive heart failure) ([place]) 08/31/03  . CHF (congestive heart failure), NYHA class III, chronic, combined ([place]) 08/10/2017  . Chronic venous stasis dermatitis of both lower extremities 06/02/2013  . CKD (chronic kidney disease), stage III ([place]) 06/20/2010   Estimated Creatinine Clearance: 34.8 mL/min (A) (by C-G formula based on SCr of 1.37 mg/dL (H)).   Marland Kitchen COLD (chronic obstructive lung disease) ([place])   . Colonic polyp 02/16/2008   Tubular adenoma  . COPD (chronic obstructive pulmonary disease) ([place]) 01/27/98  . Cor pulmonale ([place]) 11/16/2014  . D-dimer, elevated 01/01/2018  . GERD 07/30/1992  . Gout   . Heart murmur   . HTN (hypertension) 07/30/1988  . Hyperlipidemia 11/27/96  . Hyperlipidemia with target LDL less than 100 11/27/1996       . Kidney stone 1960, 1972, 1991  . MGUS (monoclonal gammopathy of  unknown significance) 11/08/2016  . Morbid obesity ([place]) 04/19/2010   She agrees to work on her lifestyle modifications to help her lose weight.   . Neck pain on right side 01/01/2018  . OSA (obstructive sleep apnea) 06/22/2013  . Osteopenia, senile 02/05/2013   July 2014  -2.1 left femur -1.9 left forearm   . Other screening mammogram 02/05/2013  . Oxygen deficiency   . Prediabetes 11/13/2006  . Renal insufficiency   . Stenosis of cervical spine with myelopathy ([place]) 01/01/2018  . Ulcer of leg, chronic, right ([place]) 10/10/2011  . Ulcerative colitis   . ULCERATIVE COLITIS-LEFT SIDE 03/19/2008       . Vitamin B12 deficiency anemia 11/13/2006       . Vitamin D deficiency 02/06/2013     Social History   Socioeconomic History  . Marital status: Married    Spouse name: Not on file  . Number of children: 3  . Years of education: Not on file  . Highest education level: Not on file  Occupational History  .  Occupation: Surveyor, quantity: [employer]  Tobacco Use  . Smoking status: Former Smoker    Quit date: 06/01/1996    Years since quitting: 24.1  . Smokeless tobacco: Never Used  . Tobacco comment: Quit 1998  Vaping Use  . Vaping Use: Never used  Substance and Sexual Activity  . Alcohol use: No  . Drug use: No  . Sexual activity: Not Currently  Other Topics Concern  . Not on file  Social History Narrative   Daily Caffeine Use:  4   Regular Exercise -  NO      Social Determinants of Health   Financial Resource Strain: High Risk  . Difficulty of Paying Living Expenses: Hard  Food Insecurity: Not on file  Transportation Needs: Not on file  Physical Activity: Not on file  Stress: Not on file  Social Connections: Not on file  Intimate Partner Violence: Not on file    Past Surgical History:  Procedure Laterality Date  . BRONCHOSCOPY    . COLONOSCOPY    . COLONOSCOPY WITH PROPOFOL N/A 02/23/2015   Procedure: COLONOSCOPY WITH PROPOFOL;  Surgeon: Ladene Artist, MD;  Location: [facility];  Service: Endoscopy;  Laterality: N/A;  . COLONOSCOPY WITH PROPOFOL N/A 08/12/2018   Procedure: COLONOSCOPY WITH PROPOFOL;  Surgeon: Ladene Artist, MD;  Location: [facility];  Service: Endoscopy;  Laterality: N/A;  . COLONOSCOPY WITH PROPOFOL N/A 05/09/2020   Procedure: COLONOSCOPY WITH PROPOFOL;  Surgeon: Ladene Artist, MD;  Location: [facility];  Service: Endoscopy;  Laterality: N/A;  To Splenic Flexture  . ESOPHAGOGASTRODUODENOSCOPY (EGD) WITH PROPOFOL N/A 02/23/2015   Procedure: ESOPHAGOGASTRODUODENOSCOPY (EGD) WITH PROPOFOL;  Surgeon: Ladene Artist, MD;  Location: [facility];  Service: Endoscopy;  Laterality: N/A;  . ESOPHAGOGASTRODUODENOSCOPY (EGD) WITH PROPOFOL N/A 08/12/2018   Procedure: ESOPHAGOGASTRODUODENOSCOPY (EGD) WITH PROPOFOL;  Surgeon: Ladene Artist, MD;  Location: [facility];  Service: Endoscopy;  Laterality: N/A;  . ESOPHAGOGASTRODUODENOSCOPY (EGD) WITH PROPOFOL  N/A 05/08/2020   Procedure: ESOPHAGOGASTRODUODENOSCOPY (EGD) WITH PROPOFOL;  Surgeon: Yetta Flock, MD;  Location: [facility];  Service: Gastroenterology;  Laterality: N/A;  . HOT HEMOSTASIS N/A 08/12/2018   Procedure: HOT HEMOSTASIS (ARGON PLASMA COAGULATION/BICAP);  Surgeon: Ladene Artist, MD;  Location: [facility];  Service: Endoscopy;  Laterality: N/A;  EGD and Colon APC  . HOT HEMOSTASIS N/A 05/08/2020   Procedure: HOT HEMOSTASIS (ARGON PLASMA COAGULATION/BICAP);  Surgeon: Yetta Flock, MD;  Location: [facility];  Service: Gastroenterology;  Laterality: N/A;  . NO PAST SURGERIES    . POLYPECTOMY      Family History  Problem Relation Age of Onset  . Stroke Mother   . Diabetes Mother   . Kidney cancer Mother   . Stomach cancer Mother   . Heart disease Mother   . Stroke Father   . Heart disease Brother   . Heart disease Brother   . Crohn's disease Brother   . Heart disease Sister        CATH,STENT  . Clotting disorder Sister   . Cervical cancer Sister   . Lung cancer Sister   . Heart attack Sister   . Diabetes Sister   . Colon cancer Neg Hx   . Esophageal cancer Neg Hx   . Rectal cancer Neg Hx     Allergies  Allergen Reactions  . Amoxicillin Diarrhea    DID THE REACTION INVOLVE: Swelling of the face/tongue/throat, SOB, or low BP? N Sudden or severe rash/hives, skin peeling, or the inside of the mouth or nose? N Did it require medical treatment? N When did it last happen?MORE THAN 10 YEARS If all above answers are "NO", may proceed with cephalosporin use.   . Celebrex [Celecoxib]     edema  . Enalapril Cough  . Lipitor [Atorvastatin]     Muscle aches  . Amlodipine Besylate     REACTION: edema  . Codeine Sulfate     REACTION: Nausea  . Hydrocodone-Acetaminophen     REACTION: Nausea    Current Outpatient Medications on File Prior to Visit  Medication Sig Dispense Refill  . acetaminophen (TYLENOL) 325 MG tablet Take 325-650 mg by  mouth daily as needed for moderate pain, fever or headache.     . albuterol (PROAIR HFA) 108 (90 Base) MCG/ACT inhaler Inhale 1-2 puffs into the lungs every 6 (six) hours as needed for wheezing or shortness of breath. 18 g 11  . allopurinol (ZYLOPRIM) 100 MG tablet Take 1 tablet (100 mg total) by mouth daily. 90 tablet 1  . ferrous sulfate 325 (65 FE) MG tablet Take 1 tablet (325 mg total) by mouth 2 (two) times daily with a meal. 180 tablet 1  . fluticasone furoate-vilanterol (BREO ELLIPTA) 100-25 MCG/INH AEPB INHALE 1 PUFF INTO THE LUNGS DAILY 120 each 1  . isosorbide-hydrALAZINE (BIDIL) 20-37.5 MG tablet Take 1 tablet by mouth 3 (three) times daily. 270 tablet 1  . metoprolol tartrate (LOPRESSOR) 25 MG tablet Take 1 tablet (25 mg total) by mouth 2 (two) times daily. (Patient taking differently: Take 12.5 mg by mouth 2 (two) times daily.) 180 tablet 1  . Pitavastatin Magnesium (ZYPITAMAG) 2 MG TABS Take 1 tablet by mouth daily. 90 tablet 1  . torsemide (DEMADEX) 20 MG tablet TAKE 1 TABLET(20 MG) BY MOUTH TWICE DAILY 180 tablet 0   No current facility-administered medications on file prior to visit.    Pulse 73       Objective:   Physical Exam        Assessment & Plan:

## 2020-08-08 ENCOUNTER — Encounter (HOSPITAL_COMMUNITY): Payer: Self-pay

## 2020-08-08 ENCOUNTER — Other Ambulatory Visit: Payer: Self-pay

## 2020-08-08 ENCOUNTER — Telehealth: Payer: Self-pay | Admitting: Internal Medicine

## 2020-08-08 ENCOUNTER — Emergency Department (HOSPITAL_COMMUNITY): Payer: Medicare Other

## 2020-08-08 DIAGNOSIS — N2889 Other specified disorders of kidney and ureter: Secondary | ICD-10-CM | POA: Diagnosis not present

## 2020-08-08 DIAGNOSIS — Z6837 Body mass index (BMI) 37.0-37.9, adult: Secondary | ICD-10-CM

## 2020-08-08 DIAGNOSIS — K439 Ventral hernia without obstruction or gangrene: Secondary | ICD-10-CM | POA: Diagnosis not present

## 2020-08-08 DIAGNOSIS — Z888 Allergy status to other drugs, medicaments and biological substances status: Secondary | ICD-10-CM

## 2020-08-08 DIAGNOSIS — Z743 Need for continuous supervision: Secondary | ICD-10-CM | POA: Diagnosis not present

## 2020-08-08 DIAGNOSIS — J440 Chronic obstructive pulmonary disease with acute lower respiratory infection: Secondary | ICD-10-CM | POA: Diagnosis not present

## 2020-08-08 DIAGNOSIS — I480 Paroxysmal atrial fibrillation: Secondary | ICD-10-CM | POA: Diagnosis not present

## 2020-08-08 DIAGNOSIS — U071 COVID-19: Secondary | ICD-10-CM | POA: Diagnosis not present

## 2020-08-08 DIAGNOSIS — L03311 Cellulitis of abdominal wall: Secondary | ICD-10-CM | POA: Diagnosis not present

## 2020-08-08 DIAGNOSIS — K56699 Other intestinal obstruction unspecified as to partial versus complete obstruction: Secondary | ICD-10-CM | POA: Diagnosis present

## 2020-08-08 DIAGNOSIS — Z885 Allergy status to narcotic agent status: Secondary | ICD-10-CM

## 2020-08-08 DIAGNOSIS — N1832 Chronic kidney disease, stage 3b: Secondary | ICD-10-CM | POA: Diagnosis present

## 2020-08-08 DIAGNOSIS — J1282 Pneumonia due to coronavirus disease 2019: Secondary | ICD-10-CM | POA: Diagnosis not present

## 2020-08-08 DIAGNOSIS — I700 Atherosclerosis of aorta: Secondary | ICD-10-CM | POA: Diagnosis present

## 2020-08-08 DIAGNOSIS — Z881 Allergy status to other antibiotic agents status: Secondary | ICD-10-CM

## 2020-08-08 DIAGNOSIS — Z8051 Family history of malignant neoplasm of kidney: Secondary | ICD-10-CM

## 2020-08-08 DIAGNOSIS — D6489 Other specified anemias: Secondary | ICD-10-CM | POA: Diagnosis present

## 2020-08-08 DIAGNOSIS — E1122 Type 2 diabetes mellitus with diabetic chronic kidney disease: Secondary | ICD-10-CM | POA: Diagnosis not present

## 2020-08-08 DIAGNOSIS — R161 Splenomegaly, not elsewhere classified: Secondary | ICD-10-CM | POA: Diagnosis not present

## 2020-08-08 DIAGNOSIS — Z9981 Dependence on supplemental oxygen: Secondary | ICD-10-CM

## 2020-08-08 DIAGNOSIS — K219 Gastro-esophageal reflux disease without esophagitis: Secondary | ICD-10-CM | POA: Diagnosis present

## 2020-08-08 DIAGNOSIS — R571 Hypovolemic shock: Secondary | ICD-10-CM | POA: Diagnosis not present

## 2020-08-08 DIAGNOSIS — Z7951 Long term (current) use of inhaled steroids: Secondary | ICD-10-CM

## 2020-08-08 DIAGNOSIS — E1152 Type 2 diabetes mellitus with diabetic peripheral angiopathy with gangrene: Secondary | ICD-10-CM | POA: Diagnosis not present

## 2020-08-08 DIAGNOSIS — K436 Other and unspecified ventral hernia with obstruction, without gangrene: Principal | ICD-10-CM | POA: Diagnosis present

## 2020-08-08 DIAGNOSIS — R109 Unspecified abdominal pain: Secondary | ICD-10-CM | POA: Diagnosis not present

## 2020-08-08 DIAGNOSIS — I5032 Chronic diastolic (congestive) heart failure: Secondary | ICD-10-CM | POA: Diagnosis present

## 2020-08-08 DIAGNOSIS — K630 Abscess of intestine: Secondary | ICD-10-CM | POA: Diagnosis present

## 2020-08-08 DIAGNOSIS — D123 Benign neoplasm of transverse colon: Secondary | ICD-10-CM | POA: Diagnosis present

## 2020-08-08 DIAGNOSIS — Z87442 Personal history of urinary calculi: Secondary | ICD-10-CM

## 2020-08-08 DIAGNOSIS — I130 Hypertensive heart and chronic kidney disease with heart failure and stage 1 through stage 4 chronic kidney disease, or unspecified chronic kidney disease: Secondary | ICD-10-CM | POA: Diagnosis present

## 2020-08-08 DIAGNOSIS — D62 Acute posthemorrhagic anemia: Secondary | ICD-10-CM | POA: Diagnosis not present

## 2020-08-08 DIAGNOSIS — M7989 Other specified soft tissue disorders: Secondary | ICD-10-CM | POA: Diagnosis not present

## 2020-08-08 DIAGNOSIS — N179 Acute kidney failure, unspecified: Secondary | ICD-10-CM | POA: Diagnosis present

## 2020-08-08 DIAGNOSIS — K469 Unspecified abdominal hernia without obstruction or gangrene: Secondary | ICD-10-CM | POA: Diagnosis not present

## 2020-08-08 DIAGNOSIS — E785 Hyperlipidemia, unspecified: Secondary | ICD-10-CM | POA: Diagnosis present

## 2020-08-08 DIAGNOSIS — Z8249 Family history of ischemic heart disease and other diseases of the circulatory system: Secondary | ICD-10-CM

## 2020-08-08 DIAGNOSIS — Z713 Dietary counseling and surveillance: Secondary | ICD-10-CM

## 2020-08-08 DIAGNOSIS — D472 Monoclonal gammopathy: Secondary | ICD-10-CM | POA: Diagnosis not present

## 2020-08-08 DIAGNOSIS — Z79899 Other long term (current) drug therapy: Secondary | ICD-10-CM

## 2020-08-08 DIAGNOSIS — K5900 Constipation, unspecified: Secondary | ICD-10-CM | POA: Diagnosis not present

## 2020-08-08 DIAGNOSIS — I2781 Cor pulmonale (chronic): Secondary | ICD-10-CM | POA: Diagnosis present

## 2020-08-08 DIAGNOSIS — K420 Umbilical hernia with obstruction, without gangrene: Secondary | ICD-10-CM | POA: Diagnosis not present

## 2020-08-08 DIAGNOSIS — D631 Anemia in chronic kidney disease: Secondary | ICD-10-CM | POA: Diagnosis present

## 2020-08-08 DIAGNOSIS — M109 Gout, unspecified: Secondary | ICD-10-CM | POA: Diagnosis not present

## 2020-08-08 DIAGNOSIS — L02211 Cutaneous abscess of abdominal wall: Secondary | ICD-10-CM | POA: Diagnosis not present

## 2020-08-08 DIAGNOSIS — N281 Cyst of kidney, acquired: Secondary | ICD-10-CM | POA: Diagnosis present

## 2020-08-08 DIAGNOSIS — I083 Combined rheumatic disorders of mitral, aortic and tricuspid valves: Secondary | ICD-10-CM | POA: Diagnosis present

## 2020-08-08 DIAGNOSIS — J9611 Chronic respiratory failure with hypoxia: Secondary | ICD-10-CM | POA: Diagnosis present

## 2020-08-08 DIAGNOSIS — I495 Sick sinus syndrome: Secondary | ICD-10-CM | POA: Diagnosis present

## 2020-08-08 DIAGNOSIS — I878 Other specified disorders of veins: Secondary | ICD-10-CM | POA: Diagnosis present

## 2020-08-08 DIAGNOSIS — K6389 Other specified diseases of intestine: Secondary | ICD-10-CM | POA: Diagnosis present

## 2020-08-08 DIAGNOSIS — E669 Obesity, unspecified: Secondary | ICD-10-CM | POA: Diagnosis present

## 2020-08-08 DIAGNOSIS — R918 Other nonspecific abnormal finding of lung field: Secondary | ICD-10-CM | POA: Diagnosis present

## 2020-08-08 DIAGNOSIS — Z87891 Personal history of nicotine dependence: Secondary | ICD-10-CM

## 2020-08-08 DIAGNOSIS — G4733 Obstructive sleep apnea (adult) (pediatric): Secondary | ICD-10-CM | POA: Diagnosis present

## 2020-08-08 DIAGNOSIS — R6889 Other general symptoms and signs: Secondary | ICD-10-CM | POA: Diagnosis not present

## 2020-08-08 DIAGNOSIS — Z833 Family history of diabetes mellitus: Secondary | ICD-10-CM

## 2020-08-08 LAB — COMPREHENSIVE METABOLIC PANEL
ALT: 15 U/L (ref 0–44)
AST: 24 U/L (ref 15–41)
Albumin: 3.2 g/dL — ABNORMAL LOW (ref 3.5–5.0)
Alkaline Phosphatase: 66 U/L (ref 38–126)
Anion gap: 15 (ref 5–15)
BUN: 35 mg/dL — ABNORMAL HIGH (ref 8–23)
CO2: 25 mmol/L (ref 22–32)
Calcium: 9.5 mg/dL (ref 8.9–10.3)
Chloride: 92 mmol/L — ABNORMAL LOW (ref 98–111)
Creatinine, Ser: 1.39 mg/dL — ABNORMAL HIGH (ref 0.44–1.00)
GFR, Estimated: 38 mL/min — ABNORMAL LOW (ref >60)
Glucose, Bld: 110 mg/dL — ABNORMAL HIGH (ref 70–99)
Potassium: 3.4 mmol/L — ABNORMAL LOW (ref 3.5–5.1)
Sodium: 132 mmol/L — ABNORMAL LOW (ref 135–145)
Total Bilirubin: 0.7 mg/dL (ref 0.3–1.2)
Total Protein: 7.6 g/dL (ref 6.5–8.1)

## 2020-08-08 LAB — CBC
HCT: 32.2 % — ABNORMAL LOW (ref 36.0–46.0)
Hemoglobin: 10.4 g/dL — ABNORMAL LOW (ref 12.0–15.0)
MCH: 25.4 pg — ABNORMAL LOW (ref 26.0–34.0)
MCHC: 32.3 g/dL (ref 30.0–36.0)
MCV: 78.7 fL — ABNORMAL LOW (ref 80.0–100.0)
Platelets: 340 10*3/uL (ref 150–400)
RBC: 4.09 MIL/uL (ref 3.87–5.11)
RDW: 14.4 % (ref 11.5–15.5)
WBC: 24.6 10*3/uL — ABNORMAL HIGH (ref 4.0–10.5)
nRBC: 0 % (ref 0.0–0.2)

## 2020-08-08 LAB — LIPASE, BLOOD: Lipase: 35 U/L (ref 11–51)

## 2020-08-08 NOTE — Telephone Encounter (Signed)
Patients granddaughter Ander Purpura calling, states that the patient is too weak to come for an appointment and has refused to go to the emergency room as well. Patient is in pain from her hernia and is wondering if we can do something to just make her comfortable. Recommended that she needs to be seen about the hernia and granddaughter was concerned because the patient is too weak to come out of the house so she was wondering if there was some way someone could come to the house and help the patient.  Lauren- [phone]

## 2020-08-08 NOTE — ED Notes (Signed)
Per patient's PCP, there are concerns for strangulated hernia, RN asked and was given verbal permission to put in X-ray of abdomen to rule out strangulated hernia.

## 2020-08-08 NOTE — ED Triage Notes (Addendum)
Per EMS- Patient c/o abdominal pain from a known hernia. Pain has been present x 3 weeks. Patient's family reports that the patient is a DNR, but did not have the form. Patient's family also signed a refusal for EMS to not  do EKG and IV's. Patient is lethargic.  Patient is on home O2 2L/min vis [facility]

## 2020-08-08 NOTE — ED Notes (Signed)
Pt's oxygen tank changed to a fresh tank by Elwin Mocha, RN.

## 2020-08-08 NOTE — Telephone Encounter (Signed)
I have spoken to PCP. He stated that "[facility]" or "[facility]" offer services to see patients in-home. I have called and spoke to pt's husband and he will relay the info to [facility].

## 2020-08-08 NOTE — ED Notes (Signed)
Patient's granddaughter called and asked if patient had been taken back to a room yet. RN explained that there is a lengthy wait time. Patient's granddaughter upset that her grandmother is sitting in the lobby and that no visitors are currently allowed in the lobby due to [place].  RN apologized for the wait.

## 2020-08-09 ENCOUNTER — Encounter (HOSPITAL_COMMUNITY): Payer: Self-pay | Admitting: Family Medicine

## 2020-08-09 ENCOUNTER — Inpatient Hospital Stay (HOSPITAL_COMMUNITY): Payer: Medicare Other

## 2020-08-09 ENCOUNTER — Inpatient Hospital Stay (HOSPITAL_COMMUNITY)
Admission: EM | Admit: 2020-08-09 | Discharge: 2020-08-19 | DRG: 329 | Disposition: A | Discharge status: Skilled Nursing Facility | Payer: Medicare Other | Attending: Internal Medicine | Admitting: Internal Medicine

## 2020-08-09 ENCOUNTER — Other Ambulatory Visit (HOSPITAL_COMMUNITY): Payer: Medicare Other

## 2020-08-09 ENCOUNTER — Emergency Department (HOSPITAL_COMMUNITY): Payer: Medicare Other

## 2020-08-09 DIAGNOSIS — I10 Essential (primary) hypertension: Secondary | ICD-10-CM | POA: Diagnosis present

## 2020-08-09 DIAGNOSIS — J1282 Pneumonia due to coronavirus disease 2019: Secondary | ICD-10-CM | POA: Diagnosis not present

## 2020-08-09 DIAGNOSIS — J90 Pleural effusion, not elsewhere classified: Secondary | ICD-10-CM | POA: Diagnosis not present

## 2020-08-09 DIAGNOSIS — E1122 Type 2 diabetes mellitus with diabetic chronic kidney disease: Secondary | ICD-10-CM | POA: Diagnosis present

## 2020-08-09 DIAGNOSIS — K651 Peritoneal abscess: Secondary | ICD-10-CM | POA: Diagnosis not present

## 2020-08-09 DIAGNOSIS — R579 Shock, unspecified: Secondary | ICD-10-CM | POA: Diagnosis not present

## 2020-08-09 DIAGNOSIS — Z8616 Personal history of COVID-19: Secondary | ICD-10-CM | POA: Diagnosis not present

## 2020-08-09 DIAGNOSIS — K420 Umbilical hernia with obstruction, without gangrene: Secondary | ICD-10-CM

## 2020-08-09 DIAGNOSIS — M109 Gout, unspecified: Secondary | ICD-10-CM | POA: Diagnosis present

## 2020-08-09 DIAGNOSIS — R1909 Other intra-abdominal and pelvic swelling, mass and lump: Secondary | ICD-10-CM | POA: Diagnosis not present

## 2020-08-09 DIAGNOSIS — K219 Gastro-esophageal reflux disease without esophagitis: Secondary | ICD-10-CM | POA: Diagnosis present

## 2020-08-09 DIAGNOSIS — K436 Other and unspecified ventral hernia with obstruction, without gangrene: Secondary | ICD-10-CM | POA: Diagnosis not present

## 2020-08-09 DIAGNOSIS — N1832 Chronic kidney disease, stage 3b: Secondary | ICD-10-CM | POA: Diagnosis not present

## 2020-08-09 DIAGNOSIS — D500 Iron deficiency anemia secondary to blood loss (chronic): Secondary | ICD-10-CM

## 2020-08-09 DIAGNOSIS — K5900 Constipation, unspecified: Secondary | ICD-10-CM | POA: Diagnosis present

## 2020-08-09 DIAGNOSIS — N2889 Other specified disorders of kidney and ureter: Secondary | ICD-10-CM | POA: Diagnosis not present

## 2020-08-09 DIAGNOSIS — E1152 Type 2 diabetes mellitus with diabetic peripheral angiopathy with gangrene: Secondary | ICD-10-CM | POA: Diagnosis not present

## 2020-08-09 DIAGNOSIS — K630 Abscess of intestine: Secondary | ICD-10-CM | POA: Diagnosis not present

## 2020-08-09 DIAGNOSIS — R0602 Shortness of breath: Secondary | ICD-10-CM | POA: Diagnosis not present

## 2020-08-09 DIAGNOSIS — M2550 Pain in unspecified joint: Secondary | ICD-10-CM | POA: Diagnosis not present

## 2020-08-09 DIAGNOSIS — I2781 Cor pulmonale (chronic): Secondary | ICD-10-CM | POA: Diagnosis present

## 2020-08-09 DIAGNOSIS — N281 Cyst of kidney, acquired: Secondary | ICD-10-CM | POA: Diagnosis not present

## 2020-08-09 DIAGNOSIS — K56699 Other intestinal obstruction unspecified as to partial versus complete obstruction: Secondary | ICD-10-CM | POA: Diagnosis present

## 2020-08-09 DIAGNOSIS — L03311 Cellulitis of abdominal wall: Secondary | ICD-10-CM | POA: Diagnosis not present

## 2020-08-09 DIAGNOSIS — L02211 Cutaneous abscess of abdominal wall: Secondary | ICD-10-CM | POA: Diagnosis not present

## 2020-08-09 DIAGNOSIS — J449 Chronic obstructive pulmonary disease, unspecified: Secondary | ICD-10-CM

## 2020-08-09 DIAGNOSIS — I130 Hypertensive heart and chronic kidney disease with heart failure and stage 1 through stage 4 chronic kidney disease, or unspecified chronic kidney disease: Secondary | ICD-10-CM | POA: Diagnosis not present

## 2020-08-09 DIAGNOSIS — R609 Edema, unspecified: Secondary | ICD-10-CM | POA: Diagnosis not present

## 2020-08-09 DIAGNOSIS — J432 Centrilobular emphysema: Secondary | ICD-10-CM | POA: Diagnosis not present

## 2020-08-09 DIAGNOSIS — R161 Splenomegaly, not elsewhere classified: Secondary | ICD-10-CM | POA: Diagnosis not present

## 2020-08-09 DIAGNOSIS — J440 Chronic obstructive pulmonary disease with acute lower respiratory infection: Secondary | ICD-10-CM | POA: Diagnosis not present

## 2020-08-09 DIAGNOSIS — Z87891 Personal history of nicotine dependence: Secondary | ICD-10-CM | POA: Diagnosis not present

## 2020-08-09 DIAGNOSIS — K3532 Acute appendicitis with perforation and localized peritonitis, without abscess: Secondary | ICD-10-CM | POA: Diagnosis not present

## 2020-08-09 DIAGNOSIS — E785 Hyperlipidemia, unspecified: Secondary | ICD-10-CM | POA: Diagnosis not present

## 2020-08-09 DIAGNOSIS — Z01810 Encounter for preprocedural cardiovascular examination: Secondary | ICD-10-CM | POA: Diagnosis not present

## 2020-08-09 DIAGNOSIS — I5032 Chronic diastolic (congestive) heart failure: Secondary | ICD-10-CM

## 2020-08-09 DIAGNOSIS — R5381 Other malaise: Secondary | ICD-10-CM | POA: Diagnosis not present

## 2020-08-09 DIAGNOSIS — K56609 Unspecified intestinal obstruction, unspecified as to partial versus complete obstruction: Secondary | ICD-10-CM | POA: Diagnosis not present

## 2020-08-09 DIAGNOSIS — Z515 Encounter for palliative care: Secondary | ICD-10-CM | POA: Diagnosis not present

## 2020-08-09 DIAGNOSIS — Z433 Encounter for attention to colostomy: Secondary | ICD-10-CM | POA: Diagnosis not present

## 2020-08-09 DIAGNOSIS — Z7401 Bed confinement status: Secondary | ICD-10-CM | POA: Diagnosis not present

## 2020-08-09 DIAGNOSIS — K439 Ventral hernia without obstruction or gangrene: Secondary | ICD-10-CM | POA: Diagnosis not present

## 2020-08-09 DIAGNOSIS — M1000 Idiopathic gout, unspecified site: Secondary | ICD-10-CM | POA: Diagnosis not present

## 2020-08-09 DIAGNOSIS — U071 COVID-19: Secondary | ICD-10-CM | POA: Diagnosis not present

## 2020-08-09 DIAGNOSIS — R571 Hypovolemic shock: Secondary | ICD-10-CM | POA: Diagnosis not present

## 2020-08-09 DIAGNOSIS — I480 Paroxysmal atrial fibrillation: Secondary | ICD-10-CM | POA: Diagnosis present

## 2020-08-09 DIAGNOSIS — I880 Nonspecific mesenteric lymphadenitis: Secondary | ICD-10-CM | POA: Diagnosis not present

## 2020-08-09 DIAGNOSIS — J9611 Chronic respiratory failure with hypoxia: Secondary | ICD-10-CM | POA: Diagnosis not present

## 2020-08-09 DIAGNOSIS — E669 Obesity, unspecified: Secondary | ICD-10-CM | POA: Diagnosis present

## 2020-08-09 DIAGNOSIS — D123 Benign neoplasm of transverse colon: Secondary | ICD-10-CM | POA: Diagnosis not present

## 2020-08-09 DIAGNOSIS — D509 Iron deficiency anemia, unspecified: Secondary | ICD-10-CM | POA: Diagnosis not present

## 2020-08-09 DIAGNOSIS — M6281 Muscle weakness (generalized): Secondary | ICD-10-CM | POA: Diagnosis not present

## 2020-08-09 DIAGNOSIS — Z933 Colostomy status: Secondary | ICD-10-CM | POA: Diagnosis not present

## 2020-08-09 DIAGNOSIS — N179 Acute kidney failure, unspecified: Secondary | ICD-10-CM | POA: Diagnosis not present

## 2020-08-09 DIAGNOSIS — R262 Difficulty in walking, not elsewhere classified: Secondary | ICD-10-CM | POA: Diagnosis not present

## 2020-08-09 DIAGNOSIS — Z743 Need for continuous supervision: Secondary | ICD-10-CM | POA: Diagnosis not present

## 2020-08-09 DIAGNOSIS — I5023 Acute on chronic systolic (congestive) heart failure: Secondary | ICD-10-CM | POA: Diagnosis not present

## 2020-08-09 DIAGNOSIS — D472 Monoclonal gammopathy: Secondary | ICD-10-CM | POA: Diagnosis present

## 2020-08-09 DIAGNOSIS — D62 Acute posthemorrhagic anemia: Secondary | ICD-10-CM | POA: Diagnosis not present

## 2020-08-09 DIAGNOSIS — G4733 Obstructive sleep apnea (adult) (pediatric): Secondary | ICD-10-CM | POA: Diagnosis not present

## 2020-08-09 DIAGNOSIS — I517 Cardiomegaly: Secondary | ICD-10-CM | POA: Diagnosis not present

## 2020-08-09 DIAGNOSIS — I5033 Acute on chronic diastolic (congestive) heart failure: Secondary | ICD-10-CM | POA: Diagnosis present

## 2020-08-09 DIAGNOSIS — Z7189 Other specified counseling: Secondary | ICD-10-CM | POA: Diagnosis not present

## 2020-08-09 DIAGNOSIS — R1310 Dysphagia, unspecified: Secondary | ICD-10-CM | POA: Diagnosis not present

## 2020-08-09 LAB — URINALYSIS, ROUTINE W REFLEX MICROSCOPIC
Bilirubin Urine: NEGATIVE
Glucose, UA: NEGATIVE mg/dL
Hgb urine dipstick: NEGATIVE
Ketones, ur: NEGATIVE mg/dL
Leukocytes,Ua: NEGATIVE
Nitrite: NEGATIVE
Protein, ur: 30 mg/dL — AB
Specific Gravity, Urine: 1.034 — ABNORMAL HIGH (ref 1.005–1.030)
pH: 5 (ref 5.0–8.0)

## 2020-08-09 LAB — C-REACTIVE PROTEIN: CRP: 21.8 mg/dL — ABNORMAL HIGH (ref <1.0)

## 2020-08-09 LAB — CREATININE, SERUM
Creatinine, Ser: 1.44 mg/dL — ABNORMAL HIGH (ref 0.44–1.00)
GFR, Estimated: 37 mL/min — ABNORMAL LOW (ref >60)

## 2020-08-09 LAB — BASIC METABOLIC PANEL
Anion gap: 10 (ref 5–15)
BUN: 33 mg/dL — ABNORMAL HIGH (ref 8–23)
CO2: 27 mmol/L (ref 22–32)
Calcium: 8.6 mg/dL — ABNORMAL LOW (ref 8.9–10.3)
Chloride: 96 mmol/L — ABNORMAL LOW (ref 98–111)
Creatinine, Ser: 1.45 mg/dL — ABNORMAL HIGH (ref 0.44–1.00)
GFR, Estimated: 36 mL/min — ABNORMAL LOW (ref >60)
Glucose, Bld: 104 mg/dL — ABNORMAL HIGH (ref 70–99)
Potassium: 3.5 mmol/L (ref 3.5–5.1)
Sodium: 133 mmol/L — ABNORMAL LOW (ref 135–145)

## 2020-08-09 LAB — FERRITIN: Ferritin: 166 ng/mL (ref 11–307)

## 2020-08-09 LAB — CBC
HCT: 27.3 % — ABNORMAL LOW (ref 36.0–46.0)
Hemoglobin: 8.6 g/dL — ABNORMAL LOW (ref 12.0–15.0)
MCH: 24.8 pg — ABNORMAL LOW (ref 26.0–34.0)
MCHC: 31.5 g/dL (ref 30.0–36.0)
MCV: 78.7 fL — ABNORMAL LOW (ref 80.0–100.0)
Platelets: 291 10*3/uL (ref 150–400)
RBC: 3.47 MIL/uL — ABNORMAL LOW (ref 3.87–5.11)
RDW: 14.5 % (ref 11.5–15.5)
WBC: 20.6 10*3/uL — ABNORMAL HIGH (ref 4.0–10.5)
nRBC: 0 % (ref 0.0–0.2)

## 2020-08-09 LAB — PROTIME-INR
INR: 1.2 (ref 0.8–1.2)
Prothrombin Time: 14.4 seconds (ref 11.4–15.2)

## 2020-08-09 LAB — D-DIMER, QUANTITATIVE: D-Dimer, Quant: 2.86 ug/mL-FEU — ABNORMAL HIGH (ref 0.00–0.50)

## 2020-08-09 LAB — LACTIC ACID, PLASMA
Lactic Acid, Venous: 1.1 mmol/L (ref 0.5–1.9)
Lactic Acid, Venous: 0.7 mmol/L (ref 0.5–1.9)

## 2020-08-09 LAB — RESP PANEL BY RT-PCR (FLU A&B, COVID) ARPGX2
Influenza A by PCR: NEGATIVE
Influenza B by PCR: NEGATIVE
SARS Coronavirus 2 by RT PCR: POSITIVE — AB

## 2020-08-09 MED ORDER — HYDROMORPHONE HCL 1 MG/ML IJ SOLN
0.5000 mg | INTRAMUSCULAR | Status: DC | PRN
  Administered 2020-08-09 – 2020-08-11 (×7): 0.5 mg via INTRAVENOUS
  Filled 2020-08-09 (×5): qty 0.5
  Filled 2020-08-09 (×2): qty 1

## 2020-08-09 MED ORDER — PIPERACILLIN-TAZOBACTAM 3.375 G IVPB 30 MIN
3.3750 g | Freq: Once | INTRAVENOUS | Status: AC
  Administered 2020-08-09: 3.375 g via INTRAVENOUS
  Filled 2020-08-09: qty 50

## 2020-08-09 MED ORDER — FENTANYL CITRATE (PF) 100 MCG/2ML IJ SOLN
INTRAMUSCULAR | Status: AC | PRN
  Administered 2020-08-09 (×2): 50 ug via INTRAVENOUS

## 2020-08-09 MED ORDER — ONDANSETRON HCL 4 MG/2ML IJ SOLN
4.0000 mg | Freq: Once | INTRAMUSCULAR | Status: AC
  Administered 2020-08-09: 4 mg via INTRAVENOUS
  Filled 2020-08-09: qty 2

## 2020-08-09 MED ORDER — IOHEXOL 300 MG/ML  SOLN
100.0000 mL | Freq: Once | INTRAMUSCULAR | Status: AC | PRN
  Administered 2020-08-09: 80 mL via INTRAVENOUS

## 2020-08-09 MED ORDER — PITAVASTATIN MAGNESIUM 2 MG PO TABS: 2.0000 mg | ORAL_TABLET | Freq: Every day | ORAL | Status: DC

## 2020-08-09 MED ORDER — LACTATED RINGERS IV SOLN: INTRAVENOUS | Status: DC

## 2020-08-09 MED ORDER — METHYLPREDNISOLONE SODIUM SUCC 40 MG IJ SOLR
40.0000 mg | Freq: Two times a day (BID) | INTRAMUSCULAR | Status: DC
  Administered 2020-08-09 – 2020-08-10 (×3): 40 mg via INTRAVENOUS
  Filled 2020-08-09 (×3): qty 1

## 2020-08-09 MED ORDER — METOPROLOL TARTRATE 5 MG/5ML IV SOLN
5.0000 mg | Freq: Four times a day (QID) | INTRAVENOUS | Status: DC | PRN
  Filled 2020-08-09: qty 5

## 2020-08-09 MED ORDER — MIDAZOLAM HCL 2 MG/2ML IJ SOLN
INTRAMUSCULAR | Status: AC | PRN
  Administered 2020-08-09 (×2): 0.5 mg via INTRAVENOUS

## 2020-08-09 MED ORDER — PIPERACILLIN-TAZOBACTAM 3.375 G IVPB
3.3750 g | Freq: Three times a day (TID) | INTRAVENOUS | Status: DC
  Administered 2020-08-09 – 2020-08-12 (×9): 3.375 g via INTRAVENOUS
  Filled 2020-08-09 (×9): qty 50

## 2020-08-09 MED ORDER — HEPARIN SODIUM (PORCINE) 5000 UNIT/ML IJ SOLN
5000.0000 [IU] | Freq: Three times a day (TID) | INTRAMUSCULAR | Status: DC
  Administered 2020-08-09: 5000 [IU] via SUBCUTANEOUS
  Filled 2020-08-09: qty 1

## 2020-08-09 MED ORDER — ALBUTEROL SULFATE HFA 108 (90 BASE) MCG/ACT IN AERS
1.0000 | INHALATION_SPRAY | RESPIRATORY_TRACT | Status: DC | PRN
  Filled 2020-08-09: qty 6.7

## 2020-08-09 MED ORDER — FENTANYL CITRATE (PF) 100 MCG/2ML IJ SOLN
INTRAMUSCULAR | Status: AC
  Filled 2020-08-09: qty 2

## 2020-08-09 MED ORDER — METOPROLOL TARTRATE 12.5 MG HALF TABLET
12.5000 mg | ORAL_TABLET | Freq: Two times a day (BID) | ORAL | Status: DC
  Administered 2020-08-09 – 2020-08-10 (×3): 12.5 mg via ORAL
  Filled 2020-08-09 (×4): qty 1

## 2020-08-09 MED ORDER — SODIUM CHLORIDE 0.9 % IV SOLN
200.0000 mg | Freq: Once | INTRAVENOUS | Status: AC
  Administered 2020-08-09: 200 mg via INTRAVENOUS
  Filled 2020-08-09: qty 200

## 2020-08-09 MED ORDER — SODIUM CHLORIDE 0.9 % IV BOLUS
1000.0000 mL | Freq: Once | INTRAVENOUS | Status: AC
  Administered 2020-08-09: 1000 mL via INTRAVENOUS

## 2020-08-09 MED ORDER — FLUTICASONE FUROATE-VILANTEROL 100-25 MCG/INH IN AEPB
1.0000 | INHALATION_SPRAY | Freq: Every day | RESPIRATORY_TRACT | Status: DC
  Administered 2020-08-10 – 2020-08-19 (×10): 1 via RESPIRATORY_TRACT
  Filled 2020-08-09 (×3): qty 28

## 2020-08-09 MED ORDER — MIDAZOLAM HCL 2 MG/2ML IJ SOLN
INTRAMUSCULAR | Status: AC
  Filled 2020-08-09: qty 2

## 2020-08-09 MED ORDER — LIDOCAINE HCL 1 % IJ SOLN
INTRAMUSCULAR | Status: AC
  Filled 2020-08-09: qty 20

## 2020-08-09 MED ORDER — TORSEMIDE 20 MG PO TABS
20.0000 mg | ORAL_TABLET | Freq: Two times a day (BID) | ORAL | Status: DC
  Administered 2020-08-09: 20 mg via ORAL
  Filled 2020-08-09 (×2): qty 1

## 2020-08-09 MED ORDER — ALLOPURINOL 100 MG PO TABS
100.0000 mg | ORAL_TABLET | Freq: Every day | ORAL | Status: DC
  Administered 2020-08-09 – 2020-08-19 (×11): 100 mg via ORAL
  Filled 2020-08-09 (×11): qty 1

## 2020-08-09 MED ORDER — HYDROMORPHONE HCL 1 MG/ML IJ SOLN
0.5000 mg | Freq: Once | INTRAMUSCULAR | Status: AC
  Administered 2020-08-09: 0.5 mg via INTRAVENOUS

## 2020-08-09 MED ORDER — ONDANSETRON HCL 4 MG PO TABS: 4.0000 mg | ORAL_TABLET | Freq: Four times a day (QID) | ORAL | Status: DC | PRN

## 2020-08-09 MED ORDER — SODIUM CHLORIDE 0.9 % IV SOLN
100.0000 mg | Freq: Every day | INTRAVENOUS | Status: AC
  Administered 2020-08-10 – 2020-08-13 (×4): 100 mg via INTRAVENOUS
  Filled 2020-08-09 (×4): qty 20

## 2020-08-09 MED ORDER — HYDROMORPHONE HCL 1 MG/ML IJ SOLN
0.5000 mg | Freq: Once | INTRAMUSCULAR | Status: AC
  Administered 2020-08-09: 0.5 mg via INTRAVENOUS
  Filled 2020-08-09: qty 1

## 2020-08-09 MED ORDER — HEPARIN SODIUM (PORCINE) 5000 UNIT/ML IJ SOLN
5000.0000 [IU] | Freq: Three times a day (TID) | INTRAMUSCULAR | Status: DC
Start: 2020-08-09 — End: 2020-08-20
  Administered 2020-08-09 – 2020-08-19 (×29): 5000 [IU] via SUBCUTANEOUS
  Filled 2020-08-09 (×29): qty 1

## 2020-08-09 MED ORDER — LIDOCAINE HCL (PF) 1 % IJ SOLN
INTRAMUSCULAR | Status: AC | PRN
  Administered 2020-08-09: 10 mL

## 2020-08-09 MED ORDER — ONDANSETRON HCL 4 MG/2ML IJ SOLN
4.0000 mg | Freq: Four times a day (QID) | INTRAMUSCULAR | Status: DC | PRN
  Administered 2020-08-09: 4 mg via INTRAVENOUS

## 2020-08-09 MED ORDER — ISOSORB DINITRATE-HYDRALAZINE 20-37.5 MG PO TABS
1.0000 | ORAL_TABLET | Freq: Two times a day (BID) | ORAL | Status: DC
  Administered 2020-08-09 – 2020-08-10 (×2): 1 via ORAL
  Filled 2020-08-09 (×6): qty 1

## 2020-08-09 NOTE — Consult Note (Signed)
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 female.   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 Was 3.2 cm 2020, 2.1cm 2017. 1 artery (early branching, calcified origin) / 1 vein [name] renovascular agatomy. She has significant renal funciton compromise and major CV and metabolic comorbidity.  2 -  Bilateral Non-Complex Renal Cysts- Scattered <3cm cysts w/o mass effect on CT 07/2020 c/w acquired renal cystic disease.  3 - Stage 3B Renal Insufficiency - GFR low 30s x many. Imaging x several including CT 2022 no hydro. Extensive vascular disase, HTN, DM2 history.  PMH sig for morbid obesity, chornic ventrla hernia, COPD, CHF/AAfib (follows [name], no anticoagulation due to GI bleeds), C19+. Her PCP is Laurie Kempf MD.   Today "[name]" is seen for opinion on incidental [name] renal mass. She has covid PNA and major baseline comorbidity with limited functional capacity.   Past Medical History:  Diagnosis Date  . Allergy   . Angiodysplasia of cecum   . Angiodysplasia of duodenum   . Arthritis   . Asteatotic eczema 02/25/2016  . Asthma   . Atrial fibrillation ([place]) 10/17/2017  . Blood transfusion without reported diagnosis   . Cataract   . Chest pain at rest 03/22/2010   Qualifier: Diagnosis of  By: Laurie Ramp MD, [name].   . CHF (congestive heart failure) ([place]) 08/31/03  . CHF (congestive heart failure), NYHA class III, chronic, combined ([place]) 08/10/2017  . Chronic venous stasis dermatitis of both lower extremities 06/02/2013  . CKD (chronic kidney disease), stage III ([place]) 06/20/2010   Estimated Creatinine Clearance: 34.8 mL/min (A) (by C-G formula based on SCr of 1.37 mg/dL (H)).   Marland Kitchen COLD (chronic  obstructive lung disease) ([place])   . Colonic polyp 02/16/2008   Tubular adenoma  . COPD (chronic obstructive pulmonary disease) ([place]) 01/27/98  . Cor pulmonale ([place]) 11/16/2014  . D-dimer, elevated 01/01/2018  . GERD 07/30/1992  . Gout   . Heart murmur   . HTN (hypertension) 07/30/1988  . Hyperlipidemia 11/27/96  . Hyperlipidemia with target LDL less than 100 11/27/1996       . Kidney stone 1960, 1972, 1991  . MGUS (monoclonal gammopathy of unknown significance) 11/08/2016  . Morbid obesity ([place]) 04/19/2010   She agrees to work on her lifestyle modifications to help her lose weight.   . Neck pain on [name] side 01/01/2018  . OSA (obstructive sleep apnea) 06/22/2013  . Osteopenia, senile 02/05/2013   July 2014  -2.1 left femur -1.9 left forearm   . Other screening mammogram 02/05/2013  . Oxygen deficiency   . Prediabetes 11/13/2006  . Renal insufficiency   . Stenosis of cervical spine with myelopathy ([place]) 01/01/2018  . Ulcer of leg, chronic, [name] ([place]) 10/10/2011  . Ulcerative colitis   . ULCERATIVE COLITIS-LEFT SIDE 03/19/2008       . Vitamin B12 deficiency anemia 11/13/2006       . Vitamin D deficiency 02/06/2013    Past Surgical History:  Procedure Laterality Date  . BRONCHOSCOPY    . COLONOSCOPY    . COLONOSCOPY WITH PROPOFOL N/A 02/23/2015   Procedure: COLONOSCOPY WITH PROPOFOL;  Surgeon: Laurie Artist, MD;  Location: [facility];  Service: Endoscopy;  Laterality: N/A;  .  COLONOSCOPY WITH PROPOFOL N/A 08/12/2018   Procedure: COLONOSCOPY WITH PROPOFOL;  Surgeon: Laurie Artist, MD;  Location: [facility];  Service: Endoscopy;  Laterality: N/A;  . COLONOSCOPY WITH PROPOFOL N/A 05/09/2020   Procedure: COLONOSCOPY WITH PROPOFOL;  Surgeon: Laurie Artist, MD;  Location: [facility];  Service: Endoscopy;  Laterality: N/A;  To Splenic Flexture  . ESOPHAGOGASTRODUODENOSCOPY (EGD) WITH PROPOFOL N/A 02/23/2015   Procedure: ESOPHAGOGASTRODUODENOSCOPY (EGD) WITH PROPOFOL;  Surgeon: Laurie Artist,  MD;  Location: [facility];  Service: Endoscopy;  Laterality: N/A;  . ESOPHAGOGASTRODUODENOSCOPY (EGD) WITH PROPOFOL N/A 08/12/2018   Procedure: ESOPHAGOGASTRODUODENOSCOPY (EGD) WITH PROPOFOL;  Surgeon: Laurie Artist, MD;  [facility];  Service: Endoscopy;  Laterality: N/A;  . ESOPHAGOGASTRODUODENOSCOPY (EGD) WITH PROPOFOL N/A 05/08/2020   Procedure: ESOPHAGOGASTRODUODENOSCOPY (EGD) WITH PROPOFOL;  Surgeon: Laurie Flock, MD;  Location: [facility];  Service: Gastroenterology;  Laterality: N/A;  . HOT HEMOSTASIS N/A 08/12/2018   Procedure: HOT HEMOSTASIS (ARGON PLASMA COAGULATION/BICAP);  Surgeon: Laurie Artist, MD;  Location[facility];  Service: Endoscopy;  Laterality: N/A;  EGD and Colon APC  . HOT HEMOSTASIS N/A 05/08/2020   Procedure: HOT HEMOSTASIS (ARGON PLASMA COAGULATION/BICAP);  Surgeon: Laurie Flock, MD;  Location: [facility];  Service: Gastroenterology;  Laterality: N/A;  . NO PAST SURGERIES    . POLYPECTOMY      Family History  Problem Relation Age of Onset  . Stroke Mother   . Diabetes Mother   . Kidney cancer Mother   . Stomach cancer Mother   . Heart disease Mother   . Stroke Father   . Heart disease Brother   . Heart disease Brother   . Crohn's disease Brother   . Heart disease Sister        CATH,STENT  . Clotting disorder Sister   . Cervical cancer Sister   . Lung cancer Sister   . Heart attack Sister   . Diabetes Sister   . Colon cancer Neg Hx   . Esophageal cancer Neg Hx   . Rectal cancer Neg Hx     Social History:  reports that she quit smoking about 24 years ago. She has never used smokeless tobacco. She reports that she does not drink alcohol and does not use drugs.  Allergies:  Allergies  Allergen Reactions  . Amoxicillin Diarrhea    DID THE REACTION INVOLVE: Swelling of the face/tongue/throat, SOB, or low BP? N Sudden or severe rash/hives, skin peeling, or the inside of the mouth or nose? N Did it require  medical treatment? N When did it last happen?MORE THAN 10 YEARS If all above answers are "NO", may proceed with cephalosporin use.   . Celebrex [Celecoxib]     edema  . Amlodipine Besylate Other (See Comments)    REACTION: edema  . Enalapril Cough  . Lipitor [Atorvastatin]     Muscle aches  . Codeine Sulfate Nausea Only  . Hydrocodone-Acetaminophen Nausea Only    Medications: I have reviewed the patient's current medications.  Results for orders placed or performed during the hospital encounter of 08/09/20 (from the past 48 hour(s))  Lipase, blood     Status: None   Collection Time: 08/08/20  5:56 PM  Result Value Ref Range   Lipase 35 11 - 51 U/L    Comment: Performed at [facility], [place] [address] [place], [place] [address]  Comprehensive metabolic panel     Status: Abnormal   Collection Time: 08/08/20  5:56 PM  Result Value Ref Range   Sodium 132 (L) 135 - 145 mmol/L   Potassium 3.4 (L) 3.5 - 5.1 mmol/L   Chloride 92 (L) 98 - 111 mmol/L   CO2 25 22 - 32 mmol/L   Glucose, Bld 110 (H) 70 - 99 mg/dL    Comment: Glucose reference range applies only to samples taken after fasting for at least 8 hours.   BUN 35 (H) 8 - 23 mg/dL   Creatinine, Ser 1.39 (H) 0.44 - 1.00 mg/dL   Calcium 9.5 8.9 - 10.3 mg/dL   Total Protein 7.6 6.5 - 8.1 g/dL   Albumin 3.2 (L) 3.5 - 5.0 g/dL   AST 24 15 - 41 U/L   ALT 15 0 - 44 U/L   Alkaline Phosphatase 66 38 - 126 U/L   Total Bilirubin 0.7 0.3 - 1.2 mg/dL   GFR, Estimated 38 (L) >60 mL/min    Comment: (NOTE) Calculated using the CKD-EPI Creatinine Equation (2021)    Anion gap 15 5 - 15    Comment: Performed at [facility], [place] [address] [place], [place] [address]  CBC     Status: Abnormal   Collection Time: 08/08/20  5:56 PM  Result Value Ref Range   WBC 24.6 (H) 4.0 - 10.5 K/uL   RBC 4.09 3.87 - 5.11 MIL/uL   Hemoglobin 10.4 (L) 12.0 - 15.0 g/dL   HCT 32.2 (L) 36.0 - 46.0 %   MCV 78.7  (L) 80.0 - 100.0 fL   MCH 25.4 (L) 26.0 - 34.0 pg   MCHC 32.3 30.0 - 36.0 g/dL   RDW 14.4 11.5 - 15.5 %   Platelets 340 150 - 400 K/uL   nRBC 0.0 0.0 - 0.2 %    Comment: Performed at [facility], [place] [address] [place], [place] [address]  Lactic acid, plasma     Status: None   Collection Time: 08/09/20  1:22 AM  Result Value Ref Range   Lactic Acid, Venous 1.1 0.5 - 1.9 mmol/L    Comment: Performed at [facility], [place] [address] [place], [name] [address]  Resp Panel by RT-PCR (Flu A&B, Covid) Nasopharyngeal Swab     Status: Abnormal   Collection Time: 08/09/20  4:41 AM   Specimen: Nasopharyngeal Swab; Nasopharyngeal(NP) swabs in vial transport medium  Result Value Ref Range   SARS Coronavirus 2 by RT PCR POSITIVE (A) NEGATIVE    Comment: RESULT CALLED TO, READ BACK BY AND VERIFIED WITH: GARRISON,G. RN AT 2500 08/09/20 MULLINS,T (NOTE) SARS-CoV-2 target nucleic acids are DETECTED.  The SARS-CoV-2 RNA is generally detectable in upper respiratory specimens during the acute phase of infection. Positive results are indicative of the presence of the identified virus, but do not rule out bacterial infection or co-infection with other pathogens not detected by the test. Clinical correlation with patient history and other diagnostic information is necessary to determine patient infection status. The expected result is Negative.  Fact Sheet for Patients: EntrepreneurPulse.com.au  Fact Sheet for Healthcare Providers: IncredibleEmployment.be  This test is not yet approved or cleared by the [place] FDA and  has been authorized for detection and/or diagnosis of SARS-CoV-2 by FDA under an Emergency Use Authorization (EUA).  This EUA will remain in effect (meaning this tes t can be used) for the duration of  the COVID-19 declaration under Section 564(b)(1) of the Act, 21 U.S.C. section 360bbb-3(b)(1), unless  the authorization is terminated or revoked sooner.  Influenza A by PCR NEGATIVE NEGATIVE   Influenza B by PCR NEGATIVE NEGATIVE    Comment: (NOTE) The Xpert Xpress SARS-CoV-2/FLU/RSV plus assay is intended as an aid in the diagnosis of influenza from Nasopharyngeal swab specimens and should not be used as a sole basis for treatment. Nasal washings and aspirates are unacceptable for Xpert Xpress SARS-CoV-2/FLU/RSV testing.  Fact Sheet for Patients: EntrepreneurPulse.com.au  Fact Sheet for Healthcare Providers: IncredibleEmployment.be  This test is not yet approved or cleared by the [place] FDA and has been authorized for detection and/or diagnosis of SARS-CoV-2 by FDA under an Emergency Use Authorization (EUA). This EUA will remain in effect (meaning this test can be used) for the duration of the COVID-19 declaration under Section 564(b)(1) of the Act, 21 U.S.C. section 360bbb-3(b)(1), unless the authorization is terminated or revoked.  Performed at [facility], [place] [address] [place], [place] [address]   Lactic acid, plasma     Status: None   Collection Time: 08/09/20  5:08 AM  Result Value Ref Range   Lactic Acid, Venous 0.7 0.5 - 1.9 mmol/L    Comment: Performed at [facility], [place] [address], [place], [place] [address]  Urinalysis, Routine w reflex microscopic     Status: Abnormal   Collection Time: 08/09/20  7:00 AM  Result Value Ref Range   Color, Urine YELLOW YELLOW   APPearance HAZY (A) CLEAR   Specific Gravity, Urine 1.034 (H) 1.005 - 1.030   pH 5.0 5.0 - 8.0   Glucose, UA NEGATIVE NEGATIVE mg/dL   Hgb urine dipstick NEGATIVE NEGATIVE   Bilirubin Urine NEGATIVE NEGATIVE   Ketones, ur NEGATIVE NEGATIVE mg/dL   Protein, ur 30 (A) NEGATIVE mg/dL   Nitrite NEGATIVE NEGATIVE   Leukocytes,Ua NEGATIVE NEGATIVE   RBC / HPF 0-5 0 - 5 RBC/hpf   WBC, UA 0-5 0 - 5 WBC/hpf   Bacteria, UA  RARE (A) NONE SEEN   Squamous Epithelial / LPF 0-5 0 - 5    Comment: Performed at [facility], [place] [address] [place], [place] [address]  CBC     Status: Abnormal   Collection Time: 08/09/20  7:35 AM  Result Value Ref Range   WBC 20.6 (H) 4.0 - 10.5 K/uL   RBC 3.47 (L) 3.87 - 5.11 MIL/uL   Hemoglobin 8.6 (L) 12.0 - 15.0 g/dL   HCT 27.3 (L) 36.0 - 46.0 %   MCV 78.7 (L) 80.0 - 100.0 fL   MCH 24.8 (L) 26.0 - 34.0 pg   MCHC 31.5 30.0 - 36.0 g/dL   RDW 14.5 11.5 - 15.5 %   Platelets 291 150 - 400 K/uL   nRBC 0.0 0.0 - 0.2 %    Comment: Performed at [facility], [place] [address] [place], [place] [address]  Creatinine, serum     Status: Abnormal   Collection Time: 08/09/20  7:35 AM  Result Value Ref Range   Creatinine, Ser 1.44 (H) 0.44 - 1.00 mg/dL   GFR, Estimated 37 (L) >60 mL/min    Comment: (NOTE) Calculated using the CKD-EPI Creatinine Equation (2021) Performed at [facility], [place] [address] [place], [place] [address]   Basic metabolic panel     Status: Abnormal   Collection Time: 08/09/20  7:35 AM  Result Value Ref Range   Sodium 133 (L) 135 - 145 mmol/L   Potassium 3.5 3.5 - 5.1 mmol/L   Chloride 96 (L) 98 - 111 mmol/L  CO2 27 22 - 32 mmol/L   Glucose, Bld 104 (H) 70 - 99 mg/dL    Comment: Glucose reference range applies only to samples taken after fasting for at least 8 hours.   BUN 33 (H) 8 - 23 mg/dL   Creatinine, Ser 1.45 (H) 0.44 - 1.00 mg/dL   Calcium 8.6 (L) 8.9 - 10.3 mg/dL   GFR, Estimated 36 (L) >60 mL/min    Comment: (NOTE) Calculated using the CKD-EPI Creatinine Equation (2021)    Anion gap 10 5 - 15    Comment: Performed at [facility], [place] [address] [place], [place] [address]  D-dimer, quantitative (not at [facility])     Status: Abnormal   Collection Time: 08/09/20  9:57 AM  Result Value Ref Range   D-Dimer, Quant 2.86 (H) 0.00 - 0.50 ug/mL-FEU    Comment: (NOTE) At the  manufacturer cut-off value of 0.5 g/mL FEU, this assay has a negative predictive value of 95-100%.This assay is intended for use in conjunction with a clinical pretest probability (PTP) assessment model to exclude pulmonary embolism (PE) and deep venous thrombosis (DVT) in outpatients suspected of PE or DVT. Results should be correlated with clinical presentation. Performed at [facility], [place] [address] [place], [place] [address]     DG Abdomen 1 View  Result Date: 08/08/2020 CLINICAL DATA:  Abdominal hernia concerns for strangulation. EXAM: ABDOMEN - 1 VIEW COMPARISON:  CT abdomen pelvis February 08, 2020 and abdominal radiograph August 12, 2018. FINDINGS: The bowel gas pattern is normal with gas visualized level of colon. Thoracolumbar spondylosis. IMPRESSION: Nonobstructive bowel gas pattern. Electronically Signed   By: Dahlia Bailiff MD   On: 08/08/2020 18:20   CT ABDOMEN PELVIS W CONTRAST  Result Date: 08/09/2020 CLINICAL DATA:  Persistent abdominal pain from known hernia x3 weeks. EXAM: CT ABDOMEN AND PELVIS WITH CONTRAST TECHNIQUE: Multidetector CT imaging of the abdomen and pelvis was performed using the standard protocol following bolus administration of intravenous contrast. CONTRAST:  63m OMNIPAQUE IOHEXOL 300 MG/ML  SOLN COMPARISON:  February 08, 2020 FINDINGS: Lower chest: There is atelectasis at the lung bases. There is a fat containing Boch[place]k hernia on the [name].The heart size is normal. Hepatobiliary: The liver is normal. Normal gallbladder.There is no biliary ductal dilation. Pancreas: Normal contours without ductal dilatation. No peripancreatic fluid collection. Spleen: There is splenomegaly with the spleen measuring approximately 13 cm craniocaudad. Adrenals/Urinary Tract: --Adrenal glands: Unremarkable. --[name] kidney/ureter: There is a solid exophytic mass arising from the interpolar region of the [name] kidney measuring 3.5 cm (axial series 2, image 30).  Multiple cysts are noted throughout the [name] kidney. There is no [name]-sided hydronephrosis. --Left kidney/ureter: Multiple cysts are noted. --Urinary bladder: Unremarkable. Stomach/Bowel: --Stomach/Duodenum: No hiatal hernia or other gastric abnormality. Normal duodenal course and caliber. --Small bowel: Unremarkable. --Colon: There is a moderate amount of stool at the level of the rectum. There is scattered colonic diverticula without CT evidence for diverticulitis. Again noted is a large ventral wall hernia containing a loop of the transverse colon in addition to portions of the patient's appendix. There is now a large air and fluid collection in the hernia sac measuring approximately 9.4 x 7.2 cm. There is significant adjacent fat stranding and overlying skin thickening. --Appendix: The visualized portions of the intraperitoneal appendix are unremarkable. The portions that are herniated are difficult to evaluate. Vascular/Lymphatic: Atherosclerotic calcification is present within the non-aneurysmal abdominal aorta, without hemodynamically significant stenosis. --there are few prominent, nonspecific retroperitoneal  lymph nodes. --No mesenteric lymphadenopathy. --No pelvic or inguinal lymphadenopathy. Reproductive: Unremarkable Other: Again noted is a large ventral wall hernia containing portions of the transverse colon as and a large volume of peritoneal fat. Musculoskeletal. There is diffuse osteopenia which limits detection of nondisplaced fractures. There is no definite acute displaced fracture identified on today's study. IMPRESSION: 1. Large ventral wall hernia containing a loop of the transverse colon. While there is no evidence for an obstruction, there is a new large air and fluid collection within the hernia sac concerning for perforation of the transverse colon with resultant abscess formation or complicated diverticulitis. Alternatively, a portion of the appendix is located within the hernia sac. As  such, the differential includes complicated appendicitis, although this is felt to be less likely. 2. There is a 3.5 cm [name] renal mass consistent with renal cell carcinoma until proven otherwise. 3. Splenomegaly. 4. Additional chronic findings as detailed above. Aortic Atherosclerosis (ICD10-I70.0). These results were called by telephone at the time of interpretation on 08/09/2020 at 2:57 am to provider DAN FLOYD , who verbally acknowledged these results. Electronically Signed   By: Constance Holster M.D.   On: 08/09/2020 03:00   DG CHEST PORT 1 VIEW  Result Date: 08/09/2020 CLINICAL DATA:  80 year old female with history of COVID infection with increasing shortness of breath. EXAM: PORTABLE CHEST 1 VIEW COMPARISON:  Chest x-ray 01/01/2018. FINDINGS: Ill-defined opacities and areas of mild interstitial prominence are noted throughout the mid to lower lungs bilaterally. Small left pleural effusion or chronic pleuroparenchymal scarring, stable compared to remote prior study from 2019. No pneumothorax. No evidence of pulmonary edema. Heart size is mildly enlarged. Upper mediastinal contours are within normal limits. Aortic atherosclerosis. IMPRESSION: 1. There are subtle findings in the mid to lower lungs which suggest developing multilobar bilateral pneumonia, compatible with reported COVID infection. 2. Mild cardiomegaly. 3. Aortic atherosclerosis. Electronically Signed   By: Vinnie Langton M.D.   On: 08/09/2020 09:14    Review of Systems  Constitutional: Positive for chills, fatigue and fever.  HENT: Negative.   Eyes: Negative.   Respiratory: Positive for cough, shortness of breath and wheezing.   Cardiovascular: Negative.   Gastrointestinal: Positive for abdominal distention and abdominal pain.  Endocrine: Negative.   Genitourinary: Negative.  Negative for hematuria.  Musculoskeletal: Negative.   Skin: Negative.   Allergic/Immunologic: Negative.   Neurological: Negative.   Hematological:  Negative.   Psychiatric/Behavioral: Negative.    Blood pressure (!) 154/46, pulse 63, temperature 98.2 F (36.8 C), temperature source Oral, resp. rate (!) 22, height 5' (1.524 m), weight 83 kg, SpO2 100 %. Physical Exam  Assessment/Plan:  1 - Incidental [name] Renal Neoplasm in Highly Comorbid Lady- likely non-metastatic slowly growing renal cancer. This is VERY UNLIKELY to become clinically significant at her level of comorbidity. Additionally, curative therapy would have high probability of pushing her to ESRD and she adamantly refuses dialysis.  I recommend only watchful waiting with consideration of systemic palliative therapy should symptomatic / metastatic disease ever develop, which is very unlikely. Do NOT recommend any concomitant kidney surgery should she have to have heroic intra-abdominal surgery for GI/hernia indications.   2 -  Bilateral Non-Complex Renal Cysts- no mass effect, low risk, observe.  3 - Stage 3B Renal Insufficiency - significant medical renal disease that is fortunately quite stable. Again, do not recommend any renal-ablating or removing interveniton as this would worsen.   Frankly discussed competing oncologic, CV, metabolic, renal function risks with patient and again  recommend only watchful waiting of renal mass. She is in complete agreement.  We will arrange for office f/u in about 17mo. Please call me directly with questions ANYtime.   TAlexis Frock1/05/2021, 12:00 PM

## 2020-08-09 NOTE — ED Notes (Signed)
Hospitalist is with pt at bedside.

## 2020-08-09 NOTE — Consult Note (Addendum)
Cardiology Consultation:   Patient ID: DAMIANA BERRIAN MRN: [id number]; DOB: 1941/02/18  Admit date: 08/09/2020 Date of Consult: 08/09/2020  Primary Care Provider: [name], MD [facility] Cardiologist: Larae Grooms, MD  [facility] Electrophysiologist:  Thompson Grayer, MD    Patient Profile:   DEKLYNN CHARLET is a 80 y.o. female with a hx of chronic diastolic HF, hx of GI bleed, PAF-not on anticoag due to GI bleed, sinus brady, HTN, chronic SOB, OSA but does not use CPAP, admitted today after presentation yesterday for abd pain and has large ventral hernia containing transverse colon, also + for COVID who is being seen today for the evaluation of pre-op eval at the request of Dr. Tawanna Solo.  History of Present Illness:   Ms. Wisnieski with above hx and followed by Dr Irish Lack for chronic diastolic HF, PAF not on anticoagulation due to GI bleeds, HTN, OSA unable to wear CPAP, tachy-brady syndrome followed by Dr. Rayann Heman and on last visit 02/25/20 her HR were mostly in the 50s and on occasion lower and he did not feel symptoms were related to HR.  He did stop metoprolol.   Negative stress test 04/26/2010.  Echo 04/2020 with EF 60-65% no RWMA, moderate concentric LVH. G2DD.  Trivial MR, trivial AR, trivial TR and PR.   On CTA of chest in 2019 she had mild coronary artery calcification.  She has COPD and 02 dependent.    Pt now admitted with abd pain. For 3 weeks.  Pain progress and had diarrhea until 3 days ago and now no bowel function.  Has been drinking water.  + cough, and while chronically SOB this may have increased somewhat.   Found to be COVID + after the 2 vaccines but no booster,  And with chronically incarcerated ventral hernia now with fluid collection in hernia sac.  Per surgery possible abscess from diverticulitis vs possible ruptured appendicitis.  And on CT also findings of 3.5 cm R renal mass.  Surgery following and is considering asking urology to see as well.      EKG:  The EKG was personally reviewed and demonstrates: Sinus rhythm, rate 65, axis within normal limits, intervals within normal limits, no acute ST-T wave changes.  Na 133, K+ 3.5 Cr 1.44 BUN 33 Ca+ 8.6  Hgb 8.6 down from 10.4 on admit WBC 24.6 on admit now 20.6 plts 291 DDimer 2.86  COVID + (?incidental finding) PCXR:  IMPRESSION: 1. There are subtle findings in the mid to lower lungs which suggest developing multilobar bilateral pneumonia, compatible with reported COVID infection. 2. Mild cardiomegaly. 3. Aortic atherosclerosis.  CT ABD IMPRESSION: 1. Large ventral wall hernia containing a loop of the transverse colon. While there is no evidence for an obstruction, there is a new large air and fluid collection within the hernia sac concerning for perforation of the transverse colon with resultant abscess formation or complicated diverticulitis. Alternatively, a portion of the appendix is located within the hernia sac. As such, the differential includes complicated appendicitis, although this is felt to be less likely. 2. There is a 3.5 cm right renal mass consistent with renal cell carcinoma until proven otherwise. 3. Splenomegaly. 4. Additional chronic findings as detailed above. Aortic Atherosclerosis (ICD10-I70.0).  Pt does not do much activity prior to this admit at baseline.   BP 202/42 to 165/60 P in the 60-70s  sp02 99-100% on 3L [place]  Past Medical History:  Diagnosis Date  . Allergy   . Angiodysplasia of cecum   .  Angiodysplasia of duodenum   . Arthritis   . Asteatotic eczema 02/25/2016  . Asthma   . Atrial fibrillation ([place]) 10/17/2017  . Blood transfusion without reported diagnosis   . Cataract   . Chest pain at rest 03/22/2010   Qualifier: Diagnosis of  By: Ronnald Ramp MD, Arvid Right.   . CHF (congestive heart failure) ([place]) 08/31/03  . CHF (congestive heart failure), NYHA class III, chronic, combined ([place]) 08/10/2017  . Chronic venous stasis dermatitis of both  lower extremities 06/02/2013  . CKD (chronic kidney disease), stage III ([place]) 06/20/2010   Estimated Creatinine Clearance: 34.8 mL/min (A) (by C-G formula based on SCr of 1.37 mg/dL (H)).   Marland Kitchen COLD (chronic obstructive lung disease) ([place])   . Colonic polyp 02/16/2008   Tubular adenoma  . COPD (chronic obstructive pulmonary disease) ([place]) 01/27/98  . Cor pulmonale ([place]) 11/16/2014  . D-dimer, elevated 01/01/2018  . GERD 07/30/1992  . Gout   . Heart murmur   . HTN (hypertension) 07/30/1988  . Hyperlipidemia 11/27/96  . Hyperlipidemia with target LDL less than 100 11/27/1996       . Kidney stone 1960, 1972, 1991  . MGUS (monoclonal gammopathy of unknown significance) 11/08/2016  . Morbid obesity ([place]) 04/19/2010   She agrees to work on her lifestyle modifications to help her lose weight.   . Neck pain on right side 01/01/2018  . OSA (obstructive sleep apnea) 06/22/2013  . Osteopenia, senile 02/05/2013   July 2014  -2.1 left femur -1.9 left forearm   . Other screening mammogram 02/05/2013  . Oxygen deficiency   . Prediabetes 11/13/2006  . Renal insufficiency   . Stenosis of cervical spine with myelopathy ([place]) 01/01/2018  . Ulcer of leg, chronic, right ([place]) 10/10/2011  . Ulcerative colitis   . ULCERATIVE COLITIS-LEFT SIDE 03/19/2008       . Vitamin B12 deficiency anemia 11/13/2006       . Vitamin D deficiency 02/06/2013    Past Surgical History:  Procedure Laterality Date  . BRONCHOSCOPY    . COLONOSCOPY    . COLONOSCOPY WITH PROPOFOL N/A 02/23/2015   Procedure: COLONOSCOPY WITH PROPOFOL;  Surgeon: Ladene Artist, MD;  Location: [facility];  Service: Endoscopy;  Laterality: N/A;  . COLONOSCOPY WITH PROPOFOL N/A 08/12/2018   Procedure: COLONOSCOPY WITH PROPOFOL;  Surgeon: Ladene Artist, MD;  Location: [facility];  Service: Endoscopy;  Laterality: N/A;  . COLONOSCOPY WITH PROPOFOL N/A 05/09/2020   Procedure: COLONOSCOPY WITH PROPOFOL;  Surgeon: Ladene Artist, MD;  Location: [facility];   Service: Endoscopy;  Laterality: N/A;  To Splenic Flexture  . ESOPHAGOGASTRODUODENOSCOPY (EGD) WITH PROPOFOL N/A 02/23/2015   Procedure: ESOPHAGOGASTRODUODENOSCOPY (EGD) WITH PROPOFOL;  Surgeon: Ladene Artist, MD;  Location: [facility];  Service: Endoscopy;  Laterality: N/A;  . ESOPHAGOGASTRODUODENOSCOPY (EGD) WITH PROPOFOL N/A 08/12/2018   Procedure: ESOPHAGOGASTRODUODENOSCOPY (EGD) WITH PROPOFOL;  Surgeon: Ladene Artist, MD;  Location: [facility];  Service: Endoscopy;  Laterality: N/A;  . ESOPHAGOGASTRODUODENOSCOPY (EGD) WITH PROPOFOL N/A 05/08/2020   Procedure: ESOPHAGOGASTRODUODENOSCOPY (EGD) WITH PROPOFOL;  Surgeon: Yetta Flock, MD;  Location: [facility];  Service: Gastroenterology;  Laterality: N/A;  . HOT HEMOSTASIS N/A 08/12/2018   Procedure: HOT HEMOSTASIS (ARGON PLASMA COAGULATION/BICAP);  Surgeon: Ladene Artist, MD;  Location: [facility];  Service: Endoscopy;  Laterality: N/A;  EGD and Colon APC  . HOT HEMOSTASIS N/A 05/08/2020   Procedure: HOT HEMOSTASIS (ARGON PLASMA COAGULATION/BICAP);  Surgeon: Yetta Flock, MD;  Location: [facility];  Service:  Gastroenterology;  Laterality: N/A;  . NO PAST SURGERIES    . POLYPECTOMY       Home Medications:  Prior to Admission medications   Medication Sig Start Date End Date Taking? Authorizing Provider  acetaminophen (TYLENOL) 325 MG tablet Take 325-650 mg by mouth daily as needed for moderate pain, fever or headache.    Yes [provider]  albuterol (PROAIR HFA) 108 (90 Base) MCG/ACT inhaler Inhale 1-2 puffs into the lungs every 6 (six) hours as needed for wheezing or shortness of breath. 10/30/16  Yes [name], MD  allopurinol (ZYLOPRIM) 100 MG tablet Take 1 tablet (100 mg total) by mouth daily. 05/29/20  Yes [name], MD  cefdinir (OMNICEF) 300 MG capsule Take 1 capsule (300 mg total) by mouth 2 (two) times daily. 08/03/20  Yes Saguier, Percell Miller, PA-C  ferrous sulfate 325 (65 FE) MG tablet  Take 1 tablet (325 mg total) by mouth 2 (two) times daily with a meal. 11/13/19  Yes [name], MD  fluticasone furoate-vilanterol (BREO ELLIPTA) 100-25 MCG/INH AEPB INHALE 1 PUFF INTO THE LUNGS DAILY 05/23/20  Yes [name], MD  isosorbide-hydrALAZINE (BIDIL) 20-37.5 MG tablet Take 1 tablet by mouth 3 (three) times daily. Patient taking differently: Take 1 tablet by mouth 2 (two) times daily. 05/18/20  Yes [name], MD  metoprolol tartrate (LOPRESSOR) 25 MG tablet Take 1 tablet (25 mg total) by mouth 2 (two) times daily. Patient taking differently: Take 12.5 mg by mouth 2 (two) times daily. 05/23/20  Yes [name], MD  metroNIDAZOLE (FLAGYL) 500 MG tablet Take 1 tablet (500 mg total) by mouth 3 (three) times daily. 08/03/20  Yes Saguier, Percell Miller, PA-C  Pitavastatin Magnesium (ZYPITAMAG) 2 MG TABS Take 1 tablet by mouth daily. 06/27/20  Yes [name], MD  torsemide (DEMADEX) 20 MG tablet TAKE 1 TABLET(20 MG) BY MOUTH TWICE DAILY Patient taking differently: Take 20 mg by mouth 2 (two) times daily. 07/27/20  Yes [name], MD    Inpatient Medications: Scheduled Meds: . allopurinol  100 mg Oral Daily  . fluticasone furoate-vilanterol  1 puff Inhalation Daily  . heparin  5,000 Units Subcutaneous Q8H  . isosorbide-hydrALAZINE  1 tablet Oral BID  . methylPREDNISolone (SOLU-MEDROL) injection  40 mg Intravenous Q12H  . metoprolol tartrate  12.5 mg Oral BID  . Pitavastatin Magnesium  1 tablet Oral Daily  . torsemide  20 mg Oral BID   Continuous Infusions: . lactated ringers 75 mL/hr at 08/09/20 0949  . piperacillin-tazobactam (ZOSYN)  IV 3.375 g (08/09/20 0949)   PRN Meds: albuterol, HYDROmorphone (DILAUDID) injection, metoprolol tartrate, ondansetron **OR** ondansetron (ZOFRAN) IV  Allergies:    Allergies  Allergen Reactions  . Amoxicillin Diarrhea    DID THE REACTION INVOLVE: Swelling of the face/tongue/throat, SOB, or low BP? N Sudden or severe  rash/hives, skin peeling, or the inside of the mouth or nose? N Did it require medical treatment? N When did it last happen?MORE THAN 10 YEARS If all above answers are "NO", may proceed with cephalosporin use.   . Celebrex [Celecoxib]     edema  . Amlodipine Besylate Other (See Comments)    REACTION: edema  . Enalapril Cough  . Lipitor [Atorvastatin]     Muscle aches  . Codeine Sulfate Nausea Only  . Hydrocodone-Acetaminophen Nausea Only    Social History:   Social History   Socioeconomic History  . Marital status: Married    Spouse name: Not  on file  . Number of children: 3  . Years of education: Not on file  . Highest education level: Not on file  Occupational History  . Occupation: Surveyor, quantity: [employer]  Tobacco Use  . Smoking status: Former Smoker    Quit date: 06/01/1996    Years since quitting: 24.2  . Smokeless tobacco: Never Used  . Tobacco comment: Quit 1998  Vaping Use  . Vaping Use: Never used  Substance and Sexual Activity  . Alcohol use: No  . Drug use: No  . Sexual activity: Not Currently  Other Topics Concern  . Not on file  Social History Narrative   Daily Caffeine Use:  4   Regular Exercise -  NO      Social Determinants of Health   Financial Resource Strain: High Risk  . Difficulty of Paying Living Expenses: Hard  Food Insecurity: Not on file  Transportation Needs: Not on file  Physical Activity: Not on file  Stress: Not on file  Social Connections: Not on file  Intimate Partner Violence: Not on file    Family History:    Family History  Problem Relation Age of Onset  . Stroke Mother   . Diabetes Mother   . Kidney cancer Mother   . Stomach cancer Mother   . Heart disease Mother   . Stroke Father   . Heart disease Brother   . Heart disease Brother   . Crohn's disease Brother   . Heart disease Sister        CATH,STENT  . Clotting disorder Sister   . Cervical cancer Sister   . Lung cancer Sister   . Heart attack  Sister   . Diabetes Sister   . Colon cancer Neg Hx   . Esophageal cancer Neg Hx   . Rectal cancer Neg Hx      ROS:  Please see the history of present illness.  General:+COVID and + fevers, no weight changes Skin:no rashes or ulcers HEENT:no blurred vision, no congestion CV:see HPI PUL:see HPI no change to mild increase of chronic SOB GI:+ diarrhea recently then no BM for last 3 days no melena, no indigestion GU:no hematuria, no dysuria MS:no joint pain, no claudication Neuro:no syncope, no lightheadedness Endo:no diabetes, no thyroid disease  All other ROS reviewed and negative.     Physical Exam/Data:   Vitals:   08/09/20 0506 08/09/20 0800 08/09/20 0820 08/09/20 0830  BP: (!) 191/58 (!) 202/42 (!) 171/45 (!) 170/45  Pulse: 83 80 68 64  Resp: 19  18   Temp: 98.6 F (37 C)   98.5 F (36.9 C)  TempSrc: Oral   Oral  SpO2: 99% 99% 99% 97%  Weight:      Height:        Intake/Output Summary (Last 24 hours) at 08/09/2020 1040 Last data filed at 08/09/2020 0515 Gross per 24 hour  Intake 1050 ml  Output -  Net 1050 ml   Last 3 Weights 08/08/2020 08/03/2020 05/23/2020  Weight (lbs) 183 lb 183 lb 6.4 oz 196 lb  Weight (kg) 83.008 kg 83.19 kg 88.905 kg     Body mass index is 35.74 kg/m.   EXAM per Dr. Percival Spanish.      Relevant CV Studies: Echo 05/11/20 IMPRESSIONS    1. Left ventricular ejection fraction, by estimation, is 60 to 65%. The  left ventricle has normal function. The left ventricle has no regional  wall motion abnormalities. There is moderate concentric left ventricular  hypertrophy. Left ventricular  diastolic parameters are consistent with Grade II diastolic dysfunction  (pseudonormalization).  2. Right ventricular systolic function is normal. The right ventricular  size is normal.  3. Left atrial size was mildly dilated.  4. The mitral valve is normal in structure. Trivial mitral valve  regurgitation.  5. The aortic valve is tricuspid. There is  mild calcification of the  aortic valve. There is mild thickening of the aortic valve. Aortic valve  regurgitation is trivial. Mild aortic valve sclerosis is present, with no  evidence of aortic valve stenosis.  6. The inferior vena cava is normal in size with greater than 50%  respiratory variability, suggesting right atrial pressure of 3 mmHg.   Comparison(s): No significant change from prior study.   FINDINGS  Left Ventricle: Left ventricular ejection fraction, by estimation, is 60  to 65%. The left ventricle has normal function. The left ventricle has no  regional wall motion abnormalities. The left ventricular internal cavity  size was normal in size. There is  moderate concentric left ventricular hypertrophy. Left ventricular  diastolic parameters are consistent with Grade II diastolic dysfunction  (pseudonormalization).   Right Ventricle: The right ventricular size is normal. Right vetricular  wall thickness was not assessed. Right ventricular systolic function is  normal.   Left Atrium: Left atrial size was mildly dilated.   Right Atrium: Right atrial size was normal in size.   Pericardium: There is no evidence of pericardial effusion.   Mitral Valve: The mitral valve is normal in structure. There is mild  thickening of the mitral valve leaflet(s). There is mild calcification of  the mitral valve leaflet(s). Mild mitral annular calcification. Trivial  mitral valve regurgitation.   Tricuspid Valve: The tricuspid valve is normal in structure. Tricuspid  valve regurgitation is trivial.   Aortic Valve: The aortic valve is tricuspid. There is mild calcification  of the aortic valve. There is mild thickening of the aortic valve. Aortic  valve regurgitation is trivial. Mild aortic valve sclerosis is present,  with no evidence of aortic valve  stenosis.   Pulmonic Valve: The pulmonic valve was normal in structure. Pulmonic valve  regurgitation is trivial.   Aorta: The  aortic root and ascending aorta are structurally normal, with  no evidence of dilitation.   Venous: The inferior vena cava is normal in size with greater than 50%  respiratory variability, suggesting right atrial pressure of 3 mmHg.   IAS/Shunts: No atrial level shunt detected by color flow Doppler.     LEFT VENTRICLE  PLAX 2D  LVIDd:     3.90 cm Diastology  LVIDs:     2.30 cm LV e' medial:  5.11 cm/s  LV PW:     1.40 cm LV E/e' medial: 21.5  LV IVS:    1.40 cm LV e' lateral:  6.20 cm/s  LVOT diam:   1.90 cm LV E/e' lateral: 17.7  LV SV:     88  LV SV Index:  48  LVOT Area:   2.84 cm     RIGHT VENTRICLE  RV S prime:   15.10 cm/s  TAPSE (M-mode): 2.0 cm   LEFT ATRIUM       Index    RIGHT ATRIUM      Index  LA diam:    4.00 cm 2.16 cm/m RA Area:   15.60 cm  LA Vol (A2C):  85.0 ml 45.93 ml/m RA Volume:  36.30 ml 19.61 ml/m  LA Vol (A4C):  66.0 ml  35.66 ml/m  LA Biplane Vol: 77.1 ml 41.66 ml/m  AORTIC VALVE  LVOT Vmax:  138.00 cm/s  LVOT Vmean: 98.400 cm/s  LVOT VTI:  0.312 m    AORTA  Ao Root diam: 2.50 cm  Ao Asc diam: 3.10 cm   Laboratory Data:  High Sensitivity Troponin:  No results for input(s): TROPONINIHS in the last 720 hours.   Chemistry Recent Labs  Lab 08/08/20 1756 08/09/20 0735  NA 132* 133*  K 3.4* 3.5  CL 92* 96*  CO2 25 27  GLUCOSE 110* 104*  BUN 35* 33*  CREATININE 1.39* 1.45*  1.44*  CALCIUM 9.5 8.6*  GFRNONAA 38* 36*  37*  ANIONGAP 15 10    Recent Labs  Lab 08/08/20 1756  PROT 7.6  ALBUMIN 3.2*  AST 24  ALT 15  ALKPHOS 66  BILITOT 0.7   Hematology Recent Labs  Lab 08/08/20 1756 08/09/20 0735  WBC 24.6* 20.6*  RBC 4.09 3.47*  HGB 10.4* 8.6*  HCT 32.2* 27.3*  MCV 78.7* 78.7*  MCH 25.4* 24.8*  MCHC 32.3 31.5  RDW 14.4 14.5  PLT 340 291   BNPNo results for input(s): BNP, PROBNP in the last 168 hours.  DDimer  Recent Labs  Lab 08/09/20 0957   DDIMER 2.86*     Radiology/Studies:  DG Abdomen 1 View  Result Date: 08/08/2020 CLINICAL DATA:  Abdominal hernia concerns for strangulation. EXAM: ABDOMEN - 1 VIEW COMPARISON:  CT abdomen pelvis February 08, 2020 and abdominal radiograph August 12, 2018. FINDINGS: The bowel gas pattern is normal with gas visualized level of colon. Thoracolumbar spondylosis. IMPRESSION: Nonobstructive bowel gas pattern. Electronically Signed   By: Dahlia Bailiff MD   On: 08/08/2020 18:20   CT ABDOMEN PELVIS W CONTRAST  Result Date: 08/09/2020 CLINICAL DATA:  Persistent abdominal pain from known hernia x3 weeks. EXAM: CT ABDOMEN AND PELVIS WITH CONTRAST TECHNIQUE: Multidetector CT imaging of the abdomen and pelvis was performed using the standard protocol following bolus administration of intravenous contrast. CONTRAST:  17m OMNIPAQUE IOHEXOL 300 MG/ML  SOLN COMPARISON:  February 08, 2020 FINDINGS: Lower chest: There is atelectasis at the lung bases. There is a fat containing Bochdalek hernia on the right.The heart size is normal. Hepatobiliary: The liver is normal. Normal gallbladder.There is no biliary ductal dilation. Pancreas: Normal contours without ductal dilatation. No peripancreatic fluid collection. Spleen: There is splenomegaly with the spleen measuring approximately 13 cm craniocaudad. Adrenals/Urinary Tract: --Adrenal glands: Unremarkable. --Right kidney/ureter: There is a solid exophytic mass arising from the interpolar region of the right kidney measuring 3.5 cm (axial series 2, image 30). Multiple cysts are noted throughout the right kidney. There is no right-sided hydronephrosis. --Left kidney/ureter: Multiple cysts are noted. --Urinary bladder: Unremarkable. Stomach/Bowel: --Stomach/Duodenum: No hiatal hernia or other gastric abnormality. Normal duodenal course and caliber. --Small bowel: Unremarkable. --Colon: There is a moderate amount of stool at the level of the rectum. There is scattered colonic  diverticula without CT evidence for diverticulitis. Again noted is a large ventral wall hernia containing a loop of the transverse colon in addition to portions of the patient's appendix. There is now a large air and fluid collection in the hernia sac measuring approximately 9.4 x 7.2 cm. There is significant adjacent fat stranding and overlying skin thickening. --Appendix: The visualized portions of the intraperitoneal appendix are unremarkable. The portions that are herniated are difficult to evaluate. Vascular/Lymphatic: Atherosclerotic calcification is present within the non-aneurysmal abdominal aorta, without hemodynamically significant stenosis. --there are few prominent,  nonspecific retroperitoneal lymph nodes. --No mesenteric lymphadenopathy. --No pelvic or inguinal lymphadenopathy. Reproductive: Unremarkable Other: Again noted is a large ventral wall hernia containing portions of the transverse colon as and a large volume of peritoneal fat. Musculoskeletal. There is diffuse osteopenia which limits detection of nondisplaced fractures. There is no definite acute displaced fracture identified on today's study. IMPRESSION: 1. Large ventral wall hernia containing a loop of the transverse colon. While there is no evidence for an obstruction, there is a new large air and fluid collection within the hernia sac concerning for perforation of the transverse colon with resultant abscess formation or complicated diverticulitis. Alternatively, a portion of the appendix is located within the hernia sac. As such, the differential includes complicated appendicitis, although this is felt to be less likely. 2. There is a 3.5 cm right renal mass consistent with renal cell carcinoma until proven otherwise. 3. Splenomegaly. 4. Additional chronic findings as detailed above. Aortic Atherosclerosis (ICD10-I70.0). These results were called by telephone at the time of interpretation on 08/09/2020 at 2:57 am to provider DAN FLOYD , who  verbally acknowledged these results. Electronically Signed   By: Constance Holster M.D.   On: 08/09/2020 03:00   DG CHEST PORT 1 VIEW  Result Date: 08/09/2020 CLINICAL DATA:  80 year old female with history of COVID infection with increasing shortness of breath. EXAM: PORTABLE CHEST 1 VIEW COMPARISON:  Chest x-ray 01/01/2018. FINDINGS: Ill-defined opacities and areas of mild interstitial prominence are noted throughout the mid to lower lungs bilaterally. Small left pleural effusion or chronic pleuroparenchymal scarring, stable compared to remote prior study from 2019. No pneumothorax. No evidence of pulmonary edema. Heart size is mildly enlarged. Upper mediastinal contours are within normal limits. Aortic atherosclerosis. IMPRESSION: 1. There are subtle findings in the mid to lower lungs which suggest developing multilobar bilateral pneumonia, compatible with reported COVID infection. 2. Mild cardiomegaly. 3. Aortic atherosclerosis. Electronically Signed   By: Vinnie Langton M.D.   On: 08/09/2020 09:14     Assessment and Plan:   1. Pre-op eval for abd surgery for incarcerated ventral hernia and possible renal surgery with mass in pt with neg nuc study in 2011, hx of chronic diastolic HF, COPD on home 02, iron def anemia and hx of GI bleeds, PAF and with tachybrady syndrome.    Her metoprolol recently stopped for bradycardia.   Has not been candidate for PPM due to stable HR and no further PAF.  She is high risk from pulmonary alone. Cardiac wise class III risk of 6.6% risk of major cardiac event.  No known obstructive CAD.  2. Chronic diastolic HF stable. On bidil and torsemide.  Currently with gentle hydration and NPO  3.  + COVID and per CXR PNA per IM 4. Infection due to incarcerated ventral hernia with ABX per IM 5.  HTN elevated, has PRN lopressor but may cause bradycardia and po lopressor again may cause brady 6. Tachybrady syndrome, with hx PAF not on anticoagulation due to GI bleed and  chronic anemia, her BB was stopped due to bradycardia. In July has been ordered this admit  7. CKD 3B per IM 8. COPD with home 02 plan for pulmonary eval. In combination with COVID-19 9. OSA does not wear CPAP 10. Morbid obesity, continue to attempt wt loss.     New York Heart Association (NYHA) Functional Class NYHA Class II  For questions or updates, please contact [facility] Please consult [email or web address] for contact info under    Signed, Cecilie Kicks,  NP  08/09/2020 10:40 AM   History and all data above reviewed.  Patient examined.  I agree with the findings as above.  The patient presented with abdominal discomfort and has a large ventral hernia as described.  She might need surgical management of this.  She has not been having any new cardiovascular complaints.  In particular she does not have any chest pressure, neck or arm discomfort.  She does have occasional atrial fibrillation that she says might last for a day at a time but she does not think she has had this recently.  She has chronic shortness of breath and O2 at home.  She says the last time she went out of the house was December 20.  She says she does get around in her house but I would suspect she is relatively sedentary.  She is not describing any new PND or orthopnea although she chronically sleeps in a chair.    The patient exam reveals  GENERAL: Chronically ill-appearing appearing HEENT:  Pupils equal round and reactive, fundi not visualized, oral mucosa unremarkable NECK:  No jugular venous distention, waveform within normal limits, carotid upstroke brisk and symmetric, no bruits, no thyromegaly LYMPHATICS:  No cervical, inguinal adenopathy LUNGS:  Clear to auscultation bilaterally BACK:  No CVA tenderness CHEST:  Unremarkable HEART:  PMI not displaced or sustained,S1 and S2 within normal limits, no S3, no S4, no clicks, no rubs, no murmurs, distant heart sounds ABD: Large ventral hernia with tenderness and  discoloration EXT:  2 plus pulses throughout, trace edema edema, no cyanosis no clubbing, chronic venous stasis changes SKIN:  No rashes no nodules NEURO:  Cranial nerves II through XII grossly intact, motor grossly intact throughout PSYCH:  Cognitively intact, oriented to person place and time    All available labs, radiology testing, previous records reviewed. Agree with documented assessment and plan. Preop: Estimation of preoperative complications is as above.  Predominantly this is related to her age, decreased conditioning and chronic lung disease.  I had this discussion with her.  However, if she is in need of urgent or life-saving surgery her cardiac issues would not preclude this.  She may well have dysrhythmias since she has a history of atrial fibrillation and would need monitoring.  No preoperative cardiovascular testing is indicated according to ACC/AHA guidelines.  Atrial fib: She has a history of paroxysms.  Continue telemetry.   HTN: She says her blood pressure is elevated at home.  Beta-blockers have been ordered but she does have a history of bradycardia so we will need to use these judiciously.  If she is n.p.o. she can have as needed hydralazine.  We should reinitiate her BiDil when possible.  Jeneen Rinks Jaclene Bartelt  1:41 PM  08/09/2020

## 2020-08-09 NOTE — ED Notes (Signed)
Pt transported to IR via stretcher.

## 2020-08-09 NOTE — Consult Note (Signed)
Consult Note  [name] May 01, 1941  [id number].    Requesting MD: Dr. Deno Etienne Chief Complaint/Reason for Consult: Hernia with abscess  HPI:  Patient is a 80 year old female with multiple medical problems as detailed below who presented to [facility] with known ventral hernia and abdominal pain x3 weeks. Pain has progressively worsened since onset and patient reports hernia feels more tense and has noted redness and warmth to the skin overlying the hernia. She reported she was having diarrhea until a few days ago and has since not had any bowel function. She reported subjective fever, chills, anorexia. Denied nausea or vomiting. She reports decreased urination but no dysuria, reports she has been drinking plenty of water. She also reports cough and slight increase in baseline SOB with this. Patient reports she avoided seeking help for this earlier because she was afraid she would be sent to the hospital. She did speak with a primary care provider over telemedicine to try to get some antibiotics because she was concerned about infection in the hernia, and was told to come to the ED.  We saw this patient to discuss hernia back in October of 2021 when she was having GI bleeding. GI was unable to traverse colonoscope past hernia. Hernia does not ever fully reduce. She reports this hernia has been present for over 15 years.  PMH otherwise significant for H/O Ulcerative colitis, HTN, A fib, Cor pulmonale, Chronic combined CHF, PVD, COPD/O2 dependence, HLD, CKD stage III, MGUS, OSA, Obesity, acute on chronic anemia secondary to chronic GIB, and gout. She does not report any changes in oxygen requirements recently. She has never had any past abdominal surgery. She quit smoking in the late 1990s, and denies alcohol or illicit drug use. Allergies listed in chart reviewed. Patient has received 2 doses of COVID vaccine and denies any known sick contacts.  ROS: Review of Systems  Constitutional:  Positive for chills, fever and malaise/fatigue.  Respiratory: Positive for cough and shortness of breath (slightly increased from baseline).   Cardiovascular: Negative for chest pain and palpitations.  Gastrointestinal: Positive for constipation and diarrhea.       Hernia with redness and increased tightness of hernia   Genitourinary: Negative for dysuria, frequency and urgency.  All other systems reviewed and are negative.   Family History  Problem Relation Age of Onset  . Stroke Mother   . Diabetes Mother   . Kidney cancer Mother   . Stomach cancer Mother   . Heart disease Mother   . Stroke Father   . Heart disease Brother   . Heart disease Brother   . Crohn's disease Brother   . Heart disease Sister        CATH,STENT  . Clotting disorder Sister   . Cervical cancer Sister   . Lung cancer Sister   . Heart attack Sister   . Diabetes Sister   . Colon cancer Neg Hx   . Esophageal cancer Neg Hx   . Rectal cancer Neg Hx     Past Medical History:  Diagnosis Date  . Allergy   . Angiodysplasia of cecum   . Angiodysplasia of duodenum   . Arthritis   . Asteatotic eczema 02/25/2016  . Asthma   . Atrial fibrillation ([place]) 10/17/2017  . Blood transfusion without reported diagnosis   . Cataract   . Chest pain at rest 03/22/2010   Qualifier: Diagnosis of  By: Ronnald Ramp MD, Arvid Right.   . CHF (congestive  heart failure) ([place]) 08/31/03  . CHF (congestive heart failure), NYHA class III, chronic, combined ([place]) 08/10/2017  . Chronic venous stasis dermatitis of both lower extremities 06/02/2013  . CKD (chronic kidney disease), stage III ([place]) 06/20/2010   Estimated Creatinine Clearance: 34.8 mL/min (A) (by C-G formula based on SCr of 1.37 mg/dL (H)).   Marland Kitchen COLD (chronic obstructive lung disease) ([place])   . Colonic polyp 02/16/2008   Tubular adenoma  . COPD (chronic obstructive pulmonary disease) ([place]) 01/27/98  . Cor pulmonale ([place]) 11/16/2014  . D-dimer, elevated 01/01/2018  . GERD 07/30/1992  . Gout    . Heart murmur   . HTN (hypertension) 07/30/1988  . Hyperlipidemia 11/27/96  . Hyperlipidemia with target LDL less than 100 11/27/1996       . Kidney stone 1960, 1972, 1991  . MGUS (monoclonal gammopathy of unknown significance) 11/08/2016  . Morbid obesity ([place]) 04/19/2010   She agrees to work on her lifestyle modifications to help her lose weight.   . Neck pain on right side 01/01/2018  . OSA (obstructive sleep apnea) 06/22/2013  . Osteopenia, senile 02/05/2013   July 2014  -2.1 left femur -1.9 left forearm   . Other screening mammogram 02/05/2013  . Oxygen deficiency   . Prediabetes 11/13/2006  . Renal insufficiency   . Stenosis of cervical spine with myelopathy ([place]) 01/01/2018  . Ulcer of leg, chronic, right ([place]) 10/10/2011  . Ulcerative colitis   . ULCERATIVE COLITIS-LEFT SIDE 03/19/2008       . Vitamin B12 deficiency anemia 11/13/2006       . Vitamin D deficiency 02/06/2013    Past Surgical History:  Procedure Laterality Date  . BRONCHOSCOPY    . COLONOSCOPY    . COLONOSCOPY WITH PROPOFOL N/A 02/23/2015   Procedure: COLONOSCOPY WITH PROPOFOL;  Surgeon: Ladene Artist, MD;  Location: [facility];  Service: Endoscopy;  Laterality: N/A;  . COLONOSCOPY WITH PROPOFOL N/A 08/12/2018   Procedure: COLONOSCOPY WITH PROPOFOL;  Surgeon: Ladene Artist, MD;  Location: [facility];  Service: Endoscopy;  Laterality: N/A;  . COLONOSCOPY WITH PROPOFOL N/A 05/09/2020   Procedure: COLONOSCOPY WITH PROPOFOL;  Surgeon: Ladene Artist, MD;  Location: [facility];  Service: Endoscopy;  Laterality: N/A;  To Splenic Flexture  . ESOPHAGOGASTRODUODENOSCOPY (EGD) WITH PROPOFOL N/A 02/23/2015   Procedure: ESOPHAGOGASTRODUODENOSCOPY (EGD) WITH PROPOFOL;  Surgeon: Ladene Artist, MD;  Location: [facility];  Service: Endoscopy;  Laterality: N/A;  . ESOPHAGOGASTRODUODENOSCOPY (EGD) WITH PROPOFOL N/A 08/12/2018   Procedure: ESOPHAGOGASTRODUODENOSCOPY (EGD) WITH PROPOFOL;  Surgeon: Ladene Artist, MD;   Location: [facility];  Service: Endoscopy;  Laterality: N/A;  . ESOPHAGOGASTRODUODENOSCOPY (EGD) WITH PROPOFOL N/A 05/08/2020   Procedure: ESOPHAGOGASTRODUODENOSCOPY (EGD) WITH PROPOFOL;  Surgeon: Yetta Flock, MD;  Location: [facility];  Service: Gastroenterology;  Laterality: N/A;  . HOT HEMOSTASIS N/A 08/12/2018   Procedure: HOT HEMOSTASIS (ARGON PLASMA COAGULATION/BICAP);  Surgeon: Ladene Artist, MD;  Location[facility];  Service: Endoscopy;  Laterality: N/A;  EGD and Colon APC  . HOT HEMOSTASIS N/A 05/08/2020   Procedure: HOT HEMOSTASIS (ARGON PLASMA COAGULATION/BICAP);  Surgeon: Yetta Flock, MD;  Location: [facility];  Service: Gastroenterology;  Laterality: N/A;  . NO PAST SURGERIES    . POLYPECTOMY      Social History:  reports that she quit smoking about 24 years ago. She has never used smokeless tobacco. She reports that she does not drink alcohol and does not use drugs.  Allergies:  Allergies  Allergen Reactions  .  Amoxicillin Diarrhea    DID THE REACTION INVOLVE: Swelling of the face/tongue/throat, SOB, or low BP? N Sudden or severe rash/hives, skin peeling, or the inside of the mouth or nose? N Did it require medical treatment? N When did it last happen?MORE THAN 10 YEARS If all above answers are "NO", may proceed with cephalosporin use.   . Celebrex [Celecoxib]     edema  . Amlodipine Besylate Other (See Comments)    REACTION: edema  . Enalapril Cough  . Lipitor [Atorvastatin]     Muscle aches  . Codeine Sulfate Nausea Only  . Hydrocodone-Acetaminophen Nausea Only    (Not in a hospital admission)   Blood pressure (!) 191/58, pulse 83, temperature 98.6 F (37 C), temperature source Oral, resp. rate 19, height 5' (1.524 m), weight 83 kg, SpO2 99 %. Physical Exam:  General: WD, ill appearing female who is laying in bed and appears uncomfortable HEENT: head is normocephalic, atraumatic.  Sclera are noninjected.  PERRL.  Ears and  nose without any masses or lesions.  Mouth is pink and moist Heart: regular, rate, and rhythm. Palpable radial and pedal pulses bilaterally Lungs: No audible wheezing.  Respiratory effort nonlabored. Oxygen saturation 99% on 3L via [place] Abd: Obese, ttp over ventral hernia which is not reducible and tense, erythema and warmth of overlying skin with some darkening of skin inferolateral to umbilicus MS: all 4 extremities are symmetrical with no cyanosis, clubbing, or edema. Skin: warm and dry with no masses, lesions, or rashes Neuro: Cranial nerves 2-12 grossly intact, sensation is normal throughout Psych: A&Ox3 with an appropriate affect.   Results for orders placed or performed during the hospital encounter of 08/09/20 (from the past 48 hour(s))  Lipase, blood     Status: None   Collection Time: 08/08/20  5:56 PM  Result Value Ref Range   Lipase 35 11 - 51 U/L    Comment: Performed at [facility], [place] [address], [place], [place] [address]  Comprehensive metabolic panel     Status: Abnormal   Collection Time: 08/08/20  5:56 PM  Result Value Ref Range   Sodium 132 (L) 135 - 145 mmol/L   Potassium 3.4 (L) 3.5 - 5.1 mmol/L   Chloride 92 (L) 98 - 111 mmol/L   CO2 25 22 - 32 mmol/L   Glucose, Bld 110 (H) 70 - 99 mg/dL    Comment: Glucose reference range applies only to samples taken after fasting for at least 8 hours.   BUN 35 (H) 8 - 23 mg/dL   Creatinine, Ser 1.39 (H) 0.44 - 1.00 mg/dL   Calcium 9.5 8.9 - 10.3 mg/dL   Total Protein 7.6 6.5 - 8.1 g/dL   Albumin 3.2 (L) 3.5 - 5.0 g/dL   AST 24 15 - 41 U/L   ALT 15 0 - 44 U/L   Alkaline Phosphatase 66 38 - 126 U/L   Total Bilirubin 0.7 0.3 - 1.2 mg/dL   GFR, Estimated 38 (L) >60 mL/min    Comment: (NOTE) Calculated using the CKD-EPI Creatinine Equation (2021)    Anion gap 15 5 - 15    Comment: Performed at [facility], [place] [address] [place], [place] [address]  CBC     Status: Abnormal    Collection Time: 08/08/20  5:56 PM  Result Value Ref Range   WBC 24.6 (H) 4.0 - 10.5 K/uL   RBC 4.09 3.87 - 5.11 MIL/uL   Hemoglobin 10.4 (L) 12.0 - 15.0  g/dL   HCT 32.2 (L) 36.0 - 46.0 %   MCV 78.7 (L) 80.0 - 100.0 fL   MCH 25.4 (L) 26.0 - 34.0 pg   MCHC 32.3 30.0 - 36.0 g/dL   RDW 14.4 11.5 - 15.5 %   Platelets 340 150 - 400 K/uL   nRBC 0.0 0.0 - 0.2 %    Comment: Performed at [facility], [place] [address] [place], [place] [address]  Lactic acid, plasma     Status: None   Collection Time: 08/09/20  1:22 AM  Result Value Ref Range   Lactic Acid, Venous 1.1 0.5 - 1.9 mmol/L    Comment: Performed at [facility], [place] [address], [place], [place] [address]  Resp Panel by RT-PCR (Flu A&B, Covid) Nasopharyngeal Swab     Status: Abnormal   Collection Time: 08/09/20  4:41 AM   Specimen: Nasopharyngeal Swab; Nasopharyngeal(NP) swabs in vial transport medium  Result Value Ref Range   SARS Coronavirus 2 by RT PCR POSITIVE (A) NEGATIVE    Comment: RESULT CALLED TO, READ BACK BY AND VERIFIED WITH: GARRISON,G. RN AT 7340 08/09/20 MULLINS,T (NOTE) SARS-CoV-2 target nucleic acids are DETECTED.  The SARS-CoV-2 RNA is generally detectable in upper respiratory specimens during the acute phase of infection. Positive results are indicative of the presence of the identified virus, but do not rule out bacterial infection or co-infection with other pathogens not detected by the test. Clinical correlation with patient history and other diagnostic information is necessary to determine patient infection status. The expected result is Negative.  Fact Sheet for Patients: EntrepreneurPulse.com.au  Fact Sheet for Healthcare Providers: IncredibleEmployment.be  This test is not yet approved or cleared by the [place] FDA and  has been authorized for detection and/or diagnosis of SARS-CoV-2 by FDA under an Emergency Use  Authorization (EUA).  This EUA will remain in effect (meaning this tes t can be used) for the duration of  the COVID-19 declaration under Section 564(b)(1) of the Act, 21 U.S.C. section 360bbb-3(b)(1), unless the authorization is terminated or revoked sooner.     Influenza A by PCR NEGATIVE NEGATIVE   Influenza B by PCR NEGATIVE NEGATIVE    Comment: (NOTE) The Xpert Xpress SARS-CoV-2/FLU/RSV plus assay is intended as an aid in the diagnosis of influenza from Nasopharyngeal swab specimens and should not be used as a sole basis for treatment. Nasal washings and aspirates are unacceptable for Xpert Xpress SARS-CoV-2/FLU/RSV testing.  Fact Sheet for Patients: EntrepreneurPulse.com.au  Fact Sheet for Healthcare Providers: IncredibleEmployment.be  This test is not yet approved or cleared by the [place] FDA and has been authorized for detection and/or diagnosis of SARS-CoV-2 by FDA under an Emergency Use Authorization (EUA). This EUA will remain in effect (meaning this test can be used) for the duration of the COVID-19 declaration under Section 564(b)(1) of the Act, 21 U.S.C. section 360bbb-3(b)(1), unless the authorization is terminated or revoked.  Performed at [facility], [place] [address] [place], [place] [address]   Lactic acid, plasma     Status: None   Collection Time: 08/09/20  5:08 AM  Result Value Ref Range   Lactic Acid, Venous 0.7 0.5 - 1.9 mmol/L    Comment: Performed at [facility], [place] [address] [place], [place] [address]  Urinalysis, Routine w reflex microscopic     Status: Abnormal   Collection Time: 08/09/20  7:00 AM  Result Value Ref Range   Color, Urine YELLOW YELLOW  APPearance HAZY (A) CLEAR   Specific Gravity, Urine 1.034 (H) 1.005 - 1.030   pH 5.0 5.0 - 8.0   Glucose, UA NEGATIVE NEGATIVE mg/dL   Hgb urine dipstick NEGATIVE NEGATIVE   Bilirubin Urine NEGATIVE NEGATIVE    Ketones, ur NEGATIVE NEGATIVE mg/dL   Protein, ur 30 (A) NEGATIVE mg/dL   Nitrite NEGATIVE NEGATIVE   Leukocytes,Ua NEGATIVE NEGATIVE   RBC / HPF 0-5 0 - 5 RBC/hpf   WBC, UA 0-5 0 - 5 WBC/hpf   Bacteria, UA RARE (A) NONE SEEN   Squamous Epithelial / LPF 0-5 0 - 5    Comment: Performed at [facility], [place] [address], [place], [place] [address]   DG Abdomen 1 View  Result Date: 08/08/2020 CLINICAL DATA:  Abdominal hernia concerns for strangulation. EXAM: ABDOMEN - 1 VIEW COMPARISON:  CT abdomen pelvis February 08, 2020 and abdominal radiograph August 12, 2018. FINDINGS: The bowel gas pattern is normal with gas visualized level of colon. Thoracolumbar spondylosis. IMPRESSION: Nonobstructive bowel gas pattern. Electronically Signed   By: Dahlia Bailiff MD   On: 08/08/2020 18:20   CT ABDOMEN PELVIS W CONTRAST  Result Date: 08/09/2020 CLINICAL DATA:  Persistent abdominal pain from known hernia x3 weeks. EXAM: CT ABDOMEN AND PELVIS WITH CONTRAST TECHNIQUE: Multidetector CT imaging of the abdomen and pelvis was performed using the standard protocol following bolus administration of intravenous contrast. CONTRAST:  102m OMNIPAQUE IOHEXOL 300 MG/ML  SOLN COMPARISON:  February 08, 2020 FINDINGS: Lower chest: There is atelectasis at the lung bases. There is a fat containing [place]k hernia on the right.The heart size is normal. Hepatobiliary: The liver is normal. Normal gallbladder.There is no biliary ductal dilation. Pancreas: Normal contours without ductal dilatation. No peripancreatic fluid collection. Spleen: There is splenomegaly with the spleen measuring approximately 13 cm craniocaudad. Adrenals/Urinary Tract: --Adrenal glands: Unremarkable. --Right kidney/ureter: There is a solid exophytic mass arising from the interpolar region of the right kidney measuring 3.5 cm (axial series 2, image 30). Multiple cysts are noted throughout the right kidney. There is no right-sided hydronephrosis.  --Left kidney/ureter: Multiple cysts are noted. --Urinary bladder: Unremarkable. Stomach/Bowel: --Stomach/Duodenum: No hiatal hernia or other gastric abnormality. Normal duodenal course and caliber. --Small bowel: Unremarkable. --Colon: There is a moderate amount of stool at the level of the rectum. There is scattered colonic diverticula without CT evidence for diverticulitis. Again noted is a large ventral wall hernia containing a loop of the transverse colon in addition to portions of the patient's appendix. There is now a large air and fluid collection in the hernia sac measuring approximately 9.4 x 7.2 cm. There is significant adjacent fat stranding and overlying skin thickening. --Appendix: The visualized portions of the intraperitoneal appendix are unremarkable. The portions that are herniated are difficult to evaluate. Vascular/Lymphatic: Atherosclerotic calcification is present within the non-aneurysmal abdominal aorta, without hemodynamically significant stenosis. --there are few prominent, nonspecific retroperitoneal lymph nodes. --No mesenteric lymphadenopathy. --No pelvic or inguinal lymphadenopathy. Reproductive: Unremarkable Other: Again noted is a large ventral wall hernia containing portions of the transverse colon as and a large volume of peritoneal fat. Musculoskeletal. There is diffuse osteopenia which limits detection of nondisplaced fractures. There is no definite acute displaced fracture identified on today's study. IMPRESSION: 1. Large ventral wall hernia containing a loop of the transverse colon. While there is no evidence for an obstruction, there is a new large air and fluid collection within the hernia sac concerning for perforation of the transverse colon with resultant abscess  formation or complicated diverticulitis. Alternatively, a portion of the appendix is located within the hernia sac. As such, the differential includes complicated appendicitis, although this is felt to be less  likely. 2. There is a 3.5 cm right renal mass consistent with renal cell carcinoma until proven otherwise. 3. Splenomegaly. 4. Additional chronic findings as detailed above. Aortic Atherosclerosis (ICD10-I70.0). These results were called by telephone at the time of interpretation on 08/09/2020 at 2:57 am to provider DAN FLOYD , who verbally acknowledged these results. Electronically Signed   By: Constance Holster M.D.   On: 08/09/2020 03:00      Assessment/Plan H/O Ulcerative colitis  HTN A fib - not on anticoagulation  Cor pulmonale Chronic combined CHF - last ECHO in Oct 2021 with EF 60-65% PVD COPD/O2 dependence HLD CKD stage III MGUS OSA Obesity- BMI 35.74 Gout   COVID positive - per primary, patient has received 2 doses of vaccine but has not had booster yet  3.5 cm R renal mass - concern for renal cell carcinoma, will discuss with primary attending  Chronically incarcerated ventral hernia  Fluid collection in hernia sac - CT today with large ventral hernia containing transverse colon, similar to previous with new air/fluid collection within there hernia - possible abscess from diverticulitis vs possible ruptured appendicitis - WBC 24, afebrile here but patient reports subjective fevers at home - concerned about impending skin breakdown over inferior hernia sac - will consult IR for possible drainage of abscess - patient is a high risk surgical candidate but if not improving, may need surgical intervention. If this is the case, would discuss with urology as well, given right renal mass. I would recommend having cardiology and pulmonology weigh in on surgical risk now just in case - if patient is still unwilling to really discuss operative intervention if needed, I would recommend palliative consult  FEN: NPO, IVF VTE: SQ heparin  ID: [id number] 1/11>>  Norm Parcel, [facility] 08/09/2020, 8:16 AM Please see Amion for pager number during day hours  7:00am-4:30pm

## 2020-08-09 NOTE — ED Notes (Signed)
Pt brought back to treatment area via wheelchair.

## 2020-08-09 NOTE — H&P (Signed)
History and Physical    [name] MBW:[id number] DOB: May 14, 1941 DOA: 08/09/2020  PCP: [name], MD   Patient coming from: Home  Chief Complaint: Abdominal pain  HPI: [name] is a 80 y.o. female with medical history significant for HTN, COPD, CKD 3, diastolic CHF morbid obesity who presents with complaint of abdominal pain for the past 3 weeks. She has a chronic ventral hernia which has grown in size the past few weeks and has become more painful. She reports severe pain with even light touch of the hernia. She has had nausea and vomiting as well as subjective fever and chills the last few weeks.  She had some diarrhea 4 days ago which is now resolved and she has not had any bowel movement in the last few days.  She reports the pain has been fairly constant for the last week and has not had any alleviating factors.  She denies any shortness of breath.  She does have a chronic nonproductive cough which is unchanged.  She has had decreased appetite the last week with the pain and nausea.   ED Course: In the emergency room she is found to have a and large ventral wall hernia that is very tender to palpation.  CT scan shows portion of the transverse colon and the portion of the appendix in the hernia.  Ventral hernia is indurated and tender to even light palpation.  CT scan shows that there is air-fluid level with area of perforation in the hernia.  Surgery has been consulted and will see patient this morning.  Hospital service has been asked to admit patient for further management  Review of Systems:  General: reports fatigue. Reports subjective intermittent fever, chills. Denies weight loss, night sweats.  HENT: Denies head trauma, headache, denies change in hearing, tinnitus.  Denies nasal bleeding.  Denies sore throat, sores in mouth.  Denies difficulty swallowing Eyes: Denies blurry vision, pain in eye, drainage.  Denies discoloration of eyes. Neck: Denies pain.  Denies  swelling.  Denies pain with movement. Cardiovascular: Denies chest pain, palpitations.  Denies edema.  Denies orthopnea Respiratory: Denies shortness of breath, cough.  Denies wheezing.  Denies sputum production Gastrointestinal: Reports abdominal pain, swelling. Reports nausea, vomiting. Denies diarrhea.  Denies melena.  Denies hematemesis. Musculoskeletal: Denies limitation of movement.  Denies deformity. Has chronic swelling of legs that is unchanged. Genitourinary: Denies pelvic pain.  Denies urinary frequency or hesitancy.  Denies dysuria.  Skin: Denies rash.  Denies petechiae, purpura, ecchymosis. Neurological: Denies syncope.  Denies seizure activity.  Denies paresthesia.  Denies slurred speech, drooping face.  Denies visual change. Psychiatric: Denies depression, anxiety. Denies hallucinations.  Past Medical History:  Diagnosis Date  . Allergy   . Angiodysplasia of cecum   . Angiodysplasia of duodenum   . Arthritis   . Asteatotic eczema 02/25/2016  . Asthma   . Atrial fibrillation ([place]) 10/17/2017  . Blood transfusion without reported diagnosis   . Cataract   . Chest pain at rest 03/22/2010   Qualifier: Diagnosis of  By: Ronnald Ramp MD, Arvid Right.   . CHF (congestive heart failure) ([place]) 08/31/03  . CHF (congestive heart failure), NYHA class III, chronic, combined ([place]) 08/10/2017  . Chronic venous stasis dermatitis of both lower extremities 06/02/2013  . CKD (chronic kidney disease), stage III ([place]) 06/20/2010   Estimated Creatinine Clearance: 34.8 mL/min (A) (by C-G formula based on SCr of 1.37 mg/dL (H)).   Marland Kitchen COLD (chronic obstructive lung disease) ([place])   .  Colonic polyp 02/16/2008   Tubular adenoma  . COPD (chronic obstructive pulmonary disease) ([place]) 01/27/98  . Cor pulmonale ([place]) 11/16/2014  . D-dimer, elevated 01/01/2018  . GERD 07/30/1992  . Gout   . Heart murmur   . HTN (hypertension) 07/30/1988  . Hyperlipidemia 11/27/96  . Hyperlipidemia with target LDL less than 100 11/27/1996        . Kidney stone 1960, 1972, 1991  . MGUS (monoclonal gammopathy of unknown significance) 11/08/2016  . Morbid obesity ([place]) 04/19/2010   She agrees to work on her lifestyle modifications to help her lose weight.   . Neck pain on right side 01/01/2018  . OSA (obstructive sleep apnea) 06/22/2013  . Osteopenia, senile 02/05/2013   July 2014  -2.1 left femur -1.9 left forearm   . Other screening mammogram 02/05/2013  . Oxygen deficiency   . Prediabetes 11/13/2006  . Renal insufficiency   . Stenosis of cervical spine with myelopathy ([place]) 01/01/2018  . Ulcer of leg, chronic, right ([place]) 10/10/2011  . Ulcerative colitis   . ULCERATIVE COLITIS-LEFT SIDE 03/19/2008       . Vitamin B12 deficiency anemia 11/13/2006       . Vitamin D deficiency 02/06/2013    Past Surgical History:  Procedure Laterality Date  . BRONCHOSCOPY    . COLONOSCOPY    . COLONOSCOPY WITH PROPOFOL N/A 02/23/2015   Procedure: COLONOSCOPY WITH PROPOFOL;  Surgeon: Ladene Artist, MD;  Location: [facility];  Service: Endoscopy;  Laterality: N/A;  . COLONOSCOPY WITH PROPOFOL N/A 08/12/2018   Procedure: COLONOSCOPY WITH PROPOFOL;  Surgeon: Ladene Artist, MD;  Location: [facility];  Service: Endoscopy;  Laterality: N/A;  . COLONOSCOPY WITH PROPOFOL N/A 05/09/2020   Procedure: COLONOSCOPY WITH PROPOFOL;  Surgeon: Ladene Artist, MD;  Location: [facility];  Service: Endoscopy;  Laterality: N/A;  To Splenic Flexture  . ESOPHAGOGASTRODUODENOSCOPY (EGD) WITH PROPOFOL N/A 02/23/2015   Procedure: ESOPHAGOGASTRODUODENOSCOPY (EGD) WITH PROPOFOL;  Surgeon: Ladene Artist, MD;  Location: [facility];  Service: Endoscopy;  Laterality: N/A;  . ESOPHAGOGASTRODUODENOSCOPY (EGD) WITH PROPOFOL N/A 08/12/2018   Procedure: ESOPHAGOGASTRODUODENOSCOPY (EGD) WITH PROPOFOL;  Surgeon: Ladene Artist, MD;  Location: [facility];  Service: Endoscopy;  Laterality: N/A;  . ESOPHAGOGASTRODUODENOSCOPY (EGD) WITH PROPOFOL N/A 05/08/2020   Procedure:  ESOPHAGOGASTRODUODENOSCOPY (EGD) WITH PROPOFOL;  Surgeon: Yetta Flock, MD;  Location: [facility];  Service: Gastroenterology;  Laterality: N/A;  . HOT HEMOSTASIS N/A 08/12/2018   Procedure: HOT HEMOSTASIS (ARGON PLASMA COAGULATION/BICAP);  Surgeon: Ladene Artist, MD;  Location: [facility];  Service: Endoscopy;  Laterality: N/A;  EGD and Colon APC  . HOT HEMOSTASIS N/A 05/08/2020   Procedure: HOT HEMOSTASIS (ARGON PLASMA COAGULATION/BICAP);  Surgeon: Yetta Flock, MD;  Location: [facility];  Service: Gastroenterology;  Laterality: N/A;  . NO PAST SURGERIES    . POLYPECTOMY      Social History  reports that she quit smoking about 24 years ago. She has never used smokeless tobacco. She reports that she does not drink alcohol and does not use drugs.  Allergies  Allergen Reactions  . Amoxicillin Diarrhea    DID THE REACTION INVOLVE: Swelling of the face/tongue/throat, SOB, or low BP? N Sudden or severe rash/hives, skin peeling, or the inside of the mouth or nose? N Did it require medical treatment? N When did it last happen?MORE THAN 10 YEARS If all above answers are "NO", may proceed with cephalosporin use.   . Celebrex [Celecoxib]     edema  .  Amlodipine Besylate Other (See Comments)    REACTION: edema  . Enalapril Cough  . Lipitor [Atorvastatin]     Muscle aches  . Codeine Sulfate Nausea Only  . Hydrocodone-Acetaminophen Nausea Only    Family History  Problem Relation Age of Onset  . Stroke Mother   . Diabetes Mother   . Kidney cancer Mother   . Stomach cancer Mother   . Heart disease Mother   . Stroke Father   . Heart disease Brother   . Heart disease Brother   . Crohn's disease Brother   . Heart disease Sister        CATH,STENT  . Clotting disorder Sister   . Cervical cancer Sister   . Lung cancer Sister   . Heart attack Sister   . Diabetes Sister   . Colon cancer Neg Hx   . Esophageal cancer Neg Hx   . Rectal cancer Neg Hx       Prior to Admission medications   Medication Sig Start Date End Date Taking? Authorizing Provider  acetaminophen (TYLENOL) 325 MG tablet Take 325-650 mg by mouth daily as needed for moderate pain, fever or headache.    Yes [provider]  albuterol (PROAIR HFA) 108 (90 Base) MCG/ACT inhaler Inhale 1-2 puffs into the lungs every 6 (six) hours as needed for wheezing or shortness of breath. 10/30/16  Yes [name], MD  allopurinol (ZYLOPRIM) 100 MG tablet Take 1 tablet (100 mg total) by mouth daily. 05/29/20  Yes [name], MD  cefdinir (OMNICEF) 300 MG capsule Take 1 capsule (300 mg total) by mouth 2 (two) times daily. 08/03/20  Yes Saguier, Percell Miller, PA-C  ferrous sulfate 325 (65 FE) MG tablet Take 1 tablet (325 mg total) by mouth 2 (two) times daily with a meal. 11/13/19  Yes [name], MD  fluticasone furoate-vilanterol (BREO ELLIPTA) 100-25 MCG/INH AEPB INHALE 1 PUFF INTO THE LUNGS DAILY 05/23/20  Yes [name], MD  isosorbide-hydrALAZINE (BIDIL) 20-37.5 MG tablet Take 1 tablet by mouth 3 (three) times daily. Patient taking differently: Take 1 tablet by mouth 2 (two) times daily. 05/18/20  Yes [name], MD  metoprolol tartrate (LOPRESSOR) 25 MG tablet Take 1 tablet (25 mg total) by mouth 2 (two) times daily. Patient taking differently: Take 12.5 mg by mouth 2 (two) times daily. 05/23/20  Yes [name], MD  metroNIDAZOLE (FLAGYL) 500 MG tablet Take 1 tablet (500 mg total) by mouth 3 (three) times daily. 08/03/20  Yes Saguier, Percell Miller, PA-C  Pitavastatin Magnesium (ZYPITAMAG) 2 MG TABS Take 1 tablet by mouth daily. 06/27/20  Yes [name], MD  torsemide (DEMADEX) 20 MG tablet TAKE 1 TABLET(20 MG) BY MOUTH TWICE DAILY Patient taking differently: Take 20 mg by mouth 2 (two) times daily. 07/27/20  Yes [name], MD    Physical Exam: Vitals:   08/09/20 0025 08/09/20 0149 08/09/20 0321 08/09/20 0506  BP: (!) 220/69 (!) 183/76 (!) 198/64 (!)  191/58  Pulse: (!) 108 86 88 83  Resp: 20 (!) 22 (!) 21 19  Temp:    98.6 F (37 C)  TempSrc:    Oral  SpO2: 98% 100% 99% 99%  Weight:      Height:        Constitutional: NAD, calm, comfortable Vitals:   08/09/20 0025 08/09/20 0149 08/09/20 0321 08/09/20 0506  BP: (!) 220/69 (!) 183/76 (!) 198/64 (!) 191/58  Pulse: (!) 108 86 88 83  Resp:  20 (!) 22 (!) 21 19  Temp:    98.6 F (37 C)  TempSrc:    Oral  SpO2: 98% 100% 99% 99%  Weight:      Height:       General: WDWN, Alert and oriented x3.  Eyes: EOMI, PERRL, conjunctivae normal.  Sclera nonicteric HENT:  [place]/AT, external ears normal.  Nares patent without epistasis.  Mucous membranes are moist.  Neck: Soft, normal range of motion, supple, no masses. Trachea midline Respiratory: clear but diminished breath sounds to auscultation bilaterally, no wheezing, no crackles. Normal respiratory effort. No accessory muscle use.  Cardiovascular: Regular rate and rhythm, no murmurs / rubs / gallops. Has mild lower extremity edema.  Abdomen: Soft, central abdominal tenderness, ventral wall hernia at the umbilicus that is distended, no rebound.  Has voluntary guarding.  Morbidly obese.  Bowel sounds hypoactive Musculoskeletal: FROM. no  cyanosis. Normal muscle tone.  Skin: Warm, dry, intact. Thickened skin on anterior legs bilaterally consistent with peripheral vascular disease.  No ulcers.  Neurologic: CN 2-12 grossly intact.  Normal speech.  Sensation intact, Strength 5/5 in all extremities.   Psychiatric: Normal judgment and insight.  Normal mood.    Labs on Admission: I have personally reviewed following labs and imaging studies  CBC: Recent Labs  Lab 08/08/20 1756  WBC 24.6*  HGB 10.4*  HCT 32.2*  MCV 78.7*  PLT 191    Basic Metabolic Panel: Recent Labs  Lab 08/08/20 1756  NA 132*  K 3.4*  CL 92*  CO2 25  GLUCOSE 110*  BUN 35*  CREATININE 1.39*  CALCIUM 9.5    GFR: Estimated Creatinine Clearance: 30.8 mL/min (A)  (by C-G formula based on SCr of 1.39 mg/dL (H)).  Liver Function Tests: Recent Labs  Lab 08/08/20 1756  AST 24  ALT 15  ALKPHOS 66  BILITOT 0.7  PROT 7.6  ALBUMIN 3.2*    Urine analysis:    Component Value Date/Time   COLORURINE YELLOW 11/05/2017 1103   APPEARANCEUR CLEAR 11/05/2017 1103   LABSPEC <=1.005 (A) 11/05/2017 1103   PHURINE 6.5 11/05/2017 1103   GLUCOSEU NEGATIVE 11/05/2017 1103   HGBUR NEGATIVE 11/05/2017 1103   HGBUR moderate 02/02/2008 1439   BILIRUBINUR NEGATIVE 11/05/2017 1103   KETONESUR NEGATIVE 11/05/2017 1103   PROTEINUR 100 (A) 06/01/2013 1722   UROBILINOGEN 0.2 11/05/2017 1103   NITRITE NEGATIVE 11/05/2017 1103   LEUKOCYTESUR NEGATIVE 11/05/2017 1103    Radiological Exams on Admission: DG Abdomen 1 View  Result Date: 08/08/2020 CLINICAL DATA:  Abdominal hernia concerns for strangulation. EXAM: ABDOMEN - 1 VIEW COMPARISON:  CT abdomen pelvis February 08, 2020 and abdominal radiograph August 12, 2018. FINDINGS: The bowel gas pattern is normal with gas visualized level of colon. Thoracolumbar spondylosis. IMPRESSION: Nonobstructive bowel gas pattern. Electronically Signed   By: Dahlia Bailiff MD   On: 08/08/2020 18:20   CT ABDOMEN PELVIS W CONTRAST  Result Date: 08/09/2020 CLINICAL DATA:  Persistent abdominal pain from known hernia x3 weeks. EXAM: CT ABDOMEN AND PELVIS WITH CONTRAST TECHNIQUE: Multidetector CT imaging of the abdomen and pelvis was performed using the standard protocol following bolus administration of intravenous contrast. CONTRAST:  16m OMNIPAQUE IOHEXOL 300 MG/ML  SOLN COMPARISON:  February 08, 2020 FINDINGS: Lower chest: There is atelectasis at the lung bases. There is a fat containing Boch[place]k hernia on the right.The heart size is normal. Hepatobiliary: The liver is normal. Normal gallbladder.There is no biliary ductal dilation. Pancreas: Normal contours without ductal dilatation. No  peripancreatic fluid collection. Spleen: There is  splenomegaly with the spleen measuring approximately 13 cm craniocaudad. Adrenals/Urinary Tract: --Adrenal glands: Unremarkable. --Right kidney/ureter: There is a solid exophytic mass arising from the interpolar region of the right kidney measuring 3.5 cm (axial series 2, image 30). Multiple cysts are noted throughout the right kidney. There is no right-sided hydronephrosis. --Left kidney/ureter: Multiple cysts are noted. --Urinary bladder: Unremarkable. Stomach/Bowel: --Stomach/Duodenum: No hiatal hernia or other gastric abnormality. Normal duodenal course and caliber. --Small bowel: Unremarkable. --Colon: There is a moderate amount of stool at the level of the rectum. There is scattered colonic diverticula without CT evidence for diverticulitis. Again noted is a large ventral wall hernia containing a loop of the transverse colon in addition to portions of the patient's appendix. There is now a large air and fluid collection in the hernia sac measuring approximately 9.4 x 7.2 cm. There is significant adjacent fat stranding and overlying skin thickening. --Appendix: The visualized portions of the intraperitoneal appendix are unremarkable. The portions that are herniated are difficult to evaluate. Vascular/Lymphatic: Atherosclerotic calcification is present within the non-aneurysmal abdominal aorta, without hemodynamically significant stenosis. --there are few prominent, nonspecific retroperitoneal lymph nodes. --No mesenteric lymphadenopathy. --No pelvic or inguinal lymphadenopathy. Reproductive: Unremarkable Other: Again noted is a large ventral wall hernia containing portions of the transverse colon as and a large volume of peritoneal fat. Musculoskeletal. There is diffuse osteopenia which limits detection of nondisplaced fractures. There is no definite acute displaced fracture identified on today's study. IMPRESSION: 1. Large ventral wall hernia containing a loop of the transverse colon. While there is no  evidence for an obstruction, there is a new large air and fluid collection within the hernia sac concerning for perforation of the transverse colon with resultant abscess formation or complicated diverticulitis. Alternatively, a portion of the appendix is located within the hernia sac. As such, the differential includes complicated appendicitis, although this is felt to be less likely. 2. There is a 3.5 cm right renal mass consistent with renal cell carcinoma until proven otherwise. 3. Splenomegaly. 4. Additional chronic findings as detailed above. Aortic Atherosclerosis (ICD10-I70.0). These results were called by telephone at the time of interpretation on 08/09/2020 at 2:57 am to provider DAN FLOYD , who verbally acknowledged these results. Electronically Signed   By: Constance Holster M.D.   On: 08/09/2020 03:00    Assessment/Plan Principal Problem:   Irreducible ventral hernia Ms. Karrer has a chronic ventral hernia that has become painful for the past 3 weeks with worsening pain.  CT scan shows portion of the transverse colon within the ventral hernia with gas levels with concern for abscess and possible perforation. Surgeries been consulted and will evaluate patient in the morning to determine best course of action.  Active Problems:   Infection as cause of abscess of colon Zosyn has been started empirically for abscess and intra-abdominal infection Surgery to evaluate and determine if surgical intervention versus percutaneous drainage will be required    HTN (hypertension) Continue metoprol, Bidil. Monitor BP. Provide lopressor IV for SBP over 170.     Chronic kidney disease, stage 3b  Stable.     CHF (congestive heart failure), NYHA class III, chronic, diastolic Continue bildil and demadex. Gentle IVF hydration. Monitor fluid status.     COPD (chronic obstructive pulmonary disease)  Continue Breo Ellipita and albuterol MDI. Supplemental oxygen as needed to keep O2 sat between 92-96%.   Incentive spirometery every 1-2 hours while awake.    Morbid obesity (HCC) Chronic.  Follow-up with PCP for dietary, lifestyle, pharmacotherapy interventions to assist with weight loss    DVT prophylaxis: Heparin for DVT prophylaxis Code Status:   Full Code  Family Communication:  Diagnosis and plan discussed with patient.  She verbalized understanding agrees with plan.  Further recommendations to follow as clinically indicated Disposition Plan:   Patient is from:  Home  Anticipated DC to:  Home   Anticipated DC date:  Anticipate more than 2 midnight stay in the hospital to treat acute      condition  Anticipated DC barriers: No barriers to discharge identified at this time.  This may change if      patient needs surgical intervention  Consults called:  Surgery, Dr. Harlow Asa, was consulted by the ER physician and he will see patient in the morning to evaluate Admission status:  Inpatient  Yevonne Aline Nevaen Tredway MD [facility]  How to contact the [facility] Attending or Consulting provider [place] or covering provider during after hours [place], for this patient?   1. Check the care team in [name] and look for a) attending/consulting [facility] provider listed and b) the [facility] team listed 2. Log into [email or web address] and use [place]'s universal password to access. If you do not have the password, please contact the hospital operator. 3. Locate the [facility] provider you are looking for under [facility] and page to a number that you can be directly reached. 4. If you still have difficulty reaching the provider, please page the [facility] (Director on Call) for the Hospitalists listed on amion for assistance.  08/09/2020, 5:19 AM

## 2020-08-09 NOTE — ED Notes (Addendum)
ED Provider at bedside to talk about pt's prognosis.

## 2020-08-09 NOTE — Progress Notes (Signed)
Patient is 80 year old female with a history of hypertension, COPD on home oxygen on 2 L/min, CKD stage IIIb, diastolic congestive heart failure, morbid obesity who presented to the emergency department from home with complaints of abdominal pain that was going on for last 3 weeks. She was found to have large ventral wall hernia which was tender.  CT imaging showed portion of the transverse colon and appendix in the hernia, air and fluid collection in the hernial sac concerning for   perforation.  General surgery, IR consulted.  Planning for IR guided drain placement.  Started on Zosyn. Patient also found to be COVID-positive.  She was complaining of some cough at home.  Currently she is on baseline oxygen requirement.  She denies any fever.  Her husband and daughter are also having cough.  Chest x-ray showed possible bilateral pneumonia.  She has been started on remdesivir and Solu-Medrol.  We will continue to monitor inflammatory markers.  CRP is elevated.  We will not order Actemra because of viscus perforation and her CRP could be elevated also from the inflammation from the bowel perforation. We also have consulted pulmonology and cardiology as per general surgery recommendation for clearance for possibility of major surgery. CT imaging also showed 3.5 cm mass in the left kidney for which urology has been consulted. Patient seen and examined at the bedside this morning in the emergency department.  During my evaluation, she was hemodynamically stable.  She was on 2 L of oxygen maintaining her saturation.  She complains of abdominal pain.  She has very tender protruding abdominal hernia.  Auscultation revealed bibasilar crackles. We will continue current management I agree with assessment plan as per Dr. Tonie Griffith who saw patient this mrng. I called her husband this afternoon and updated our plan.

## 2020-08-09 NOTE — Consult Note (Addendum)
Chief Complaint: Patient was seen in consultation today for image guided drainage of abdominal fluid collection Chief Complaint  Patient presents with  . Abdominal Pain    Referring Physician(s): Johnson,K,PA-C/Blackman,D  Supervising Physician: Corrie Mckusick  Patient Status: [facility] - ED  History of Present Illness: [name] is an 80 y.o. female former smoker with significant past medical history including angiodysplasia of the bowel, asthma, PVD, atrial fibrillation, CHF, prior GI bleed, chronic kidney disease, chronic lung disease/ COPD, cor pulmonale, GERD, gout, hypertension, hyperlipidemia, nephrolithiasis, MGUS, morbid obesity, obstructive sleep apnea, ulcerative colitis, vitamin D and B12 deficiency and known ventral hernia who presented to [facility] ED on 08/08/20 with fatigue, fever, chills, cough, dyspnea, anorexia, constipation and persistent abdominal pain along with tightness, redness and warmth of skin overlying the hernia.  The hernia does not ever fully reduce and has reportedly been present for over 15 years.  She was subsequently found to be COVID-19 positive.  CT of the abdomen pelvis revealed:   1. Large ventral wall hernia containing a loop of the transverse colon. While there is no evidence for an obstruction, there is a new large air and fluid collection within the hernia sac concerning for perforation of the transverse colon with resultant abscess formation or complicated diverticulitis. Alternatively, a portion of the appendix is located within the hernia sac. As such, the differential includes complicated appendicitis, although this is felt to be less likely. 2. There is a 3.5 cm right renal mass consistent with renal cell carcinoma until proven otherwise. 3. Splenomegaly. Mild cardiomegaly/aortic atherosclerosis  She is currently afebrile, WBC 20.6, hemoglobin 8.6, platelets 291K, potassium 3.5, creatinine 1.45.  Request now received from surgery  for image guided drainage of large air and fluid collection within hernia sac.  Past Medical History:  Diagnosis Date  . Allergy   . Angiodysplasia of cecum   . Angiodysplasia of duodenum   . Arthritis   . Asteatotic eczema 02/25/2016  . Asthma   . Atrial fibrillation ([place]) 10/17/2017  . Blood transfusion without reported diagnosis   . Cataract   . Chest pain at rest 03/22/2010   Qualifier: Diagnosis of  By: Ronnald Ramp MD, Arvid Right.   . CHF (congestive heart failure) ([place]) 08/31/03  . CHF (congestive heart failure), NYHA class III, chronic, combined ([place]) 08/10/2017  . Chronic venous stasis dermatitis of both lower extremities 06/02/2013  . CKD (chronic kidney disease), stage III ([place]) 06/20/2010   Estimated Creatinine Clearance: 34.8 mL/min (A) (by C-G formula based on SCr of 1.37 mg/dL (H)).   Marland Kitchen COLD (chronic obstructive lung disease) ([place])   . Colonic polyp 02/16/2008   Tubular adenoma  . COPD (chronic obstructive pulmonary disease) ([place]) 01/27/98  . Cor pulmonale ([place]) 11/16/2014  . D-dimer, elevated 01/01/2018  . GERD 07/30/1992  . Gout   . Heart murmur   . HTN (hypertension) 07/30/1988  . Hyperlipidemia 11/27/96  . Hyperlipidemia with target LDL less than 100 11/27/1996       . Kidney stone 1960, 1972, 1991  . MGUS (monoclonal gammopathy of unknown significance) 11/08/2016  . Morbid obesity ([place]) 04/19/2010   She agrees to work on her lifestyle modifications to help her lose weight.   . Neck pain on right side 01/01/2018  . OSA (obstructive sleep apnea) 06/22/2013  . Osteopenia, senile 02/05/2013   July 2014  -2.1 left femur -1.9 left forearm   . Other screening mammogram 02/05/2013  . Oxygen deficiency   . Prediabetes 11/13/2006  .  Renal insufficiency   . Stenosis of cervical spine with myelopathy ([place]) 01/01/2018  . Ulcer of leg, chronic, right ([place]) 10/10/2011  . Ulcerative colitis   . ULCERATIVE COLITIS-LEFT SIDE 03/19/2008       . Vitamin B12 deficiency anemia 11/13/2006       . Vitamin D  deficiency 02/06/2013    Past Surgical History:  Procedure Laterality Date  . BRONCHOSCOPY    . COLONOSCOPY    . COLONOSCOPY WITH PROPOFOL N/A 02/23/2015   Procedure: COLONOSCOPY WITH PROPOFOL;  Surgeon: Ladene Artist, MD;  Location: [facility];  Service: Endoscopy;  Laterality: N/A;  . COLONOSCOPY WITH PROPOFOL N/A 08/12/2018   Procedure: COLONOSCOPY WITH PROPOFOL;  Surgeon: Ladene Artist, MD;  Location: [facility];  Service: Endoscopy;  Laterality: N/A;  . COLONOSCOPY WITH PROPOFOL N/A 05/09/2020   Procedure: COLONOSCOPY WITH PROPOFOL;  Surgeon: Ladene Artist, MD;  Location: [facility];  Service: Endoscopy;  Laterality: N/A;  To Splenic Flexture  . ESOPHAGOGASTRODUODENOSCOPY (EGD) WITH PROPOFOL N/A 02/23/2015   Procedure: ESOPHAGOGASTRODUODENOSCOPY (EGD) WITH PROPOFOL;  Surgeon: Ladene Artist, MD;  Location: [facility];  Service: Endoscopy;  Laterality: N/A;  . ESOPHAGOGASTRODUODENOSCOPY (EGD) WITH PROPOFOL N/A 08/12/2018   Procedure: ESOPHAGOGASTRODUODENOSCOPY (EGD) WITH PROPOFOL;  Surgeon: Ladene Artist, MD;  Location: [facility];  Service: Endoscopy;  Laterality: N/A;  . ESOPHAGOGASTRODUODENOSCOPY (EGD) WITH PROPOFOL N/A 05/08/2020   Procedure: ESOPHAGOGASTRODUODENOSCOPY (EGD) WITH PROPOFOL;  Surgeon: Yetta Flock, MD;  Location: [facility];  Service: Gastroenterology;  Laterality: N/A;  . HOT HEMOSTASIS N/A 08/12/2018   Procedure: HOT HEMOSTASIS (ARGON PLASMA COAGULATION/BICAP);  Surgeon: Ladene Artist, MD;  Location: [facility];  Service: Endoscopy;  Laterality: N/A;  EGD and Colon APC  . HOT HEMOSTASIS N/A 05/08/2020   Procedure: HOT HEMOSTASIS (ARGON PLASMA COAGULATION/BICAP);  Surgeon: Yetta Flock, MD;  Location: [facility];  Service: Gastroenterology;  Laterality: N/A;  . NO PAST SURGERIES    . POLYPECTOMY      Allergies: Amoxicillin, Celebrex [celecoxib], Amlodipine besylate, Enalapril, Lipitor [atorvastatin], Codeine sulfate, and  Hydrocodone-acetaminophen  Medications: Prior to Admission medications   Medication Sig Start Date End Date Taking? Authorizing Provider  acetaminophen (TYLENOL) 325 MG tablet Take 325-650 mg by mouth daily as needed for moderate pain, fever or headache.    Yes [provider]  albuterol (PROAIR HFA) 108 (90 Base) MCG/ACT inhaler Inhale 1-2 puffs into the lungs every 6 (six) hours as needed for wheezing or shortness of breath. 10/30/16  Yes [name], MD  allopurinol (ZYLOPRIM) 100 MG tablet Take 1 tablet (100 mg total) by mouth daily. 05/29/20  Yes [name], MD  cefdinir (OMNICEF) 300 MG capsule Take 1 capsule (300 mg total) by mouth 2 (two) times daily. 08/03/20  Yes Saguier, Percell Miller, PA-C  ferrous sulfate 325 (65 FE) MG tablet Take 1 tablet (325 mg total) by mouth 2 (two) times daily with a meal. 11/13/19  Yes [name], MD  fluticasone furoate-vilanterol (BREO ELLIPTA) 100-25 MCG/INH AEPB INHALE 1 PUFF INTO THE LUNGS DAILY 05/23/20  Yes [name], MD  isosorbide-hydrALAZINE (BIDIL) 20-37.5 MG tablet Take 1 tablet by mouth 3 (three) times daily. Patient taking differently: Take 1 tablet by mouth 2 (two) times daily. 05/18/20  Yes [name], MD  metoprolol tartrate (LOPRESSOR) 25 MG tablet Take 1 tablet (25 mg total) by mouth 2 (two) times daily. Patient taking differently: Take 12.5 mg by mouth 2 (two) times daily. 05/23/20  Yes Ronnald Ramp,  Arvid Right, MD  metroNIDAZOLE (FLAGYL) 500 MG tablet Take 1 tablet (500 mg total) by mouth 3 (three) times daily. 08/03/20  Yes Saguier, Percell Miller, PA-C  Pitavastatin Magnesium (ZYPITAMAG) 2 MG TABS Take 1 tablet by mouth daily. 06/27/20  Yes [name], MD  torsemide (DEMADEX) 20 MG tablet TAKE 1 TABLET(20 MG) BY MOUTH TWICE DAILY Patient taking differently: Take 20 mg by mouth 2 (two) times daily. 07/27/20  Yes [name], MD     Family History  Problem Relation Age of Onset  . Stroke Mother   . Diabetes Mother   .  Kidney cancer Mother   . Stomach cancer Mother   . Heart disease Mother   . Stroke Father   . Heart disease Brother   . Heart disease Brother   . Crohn's disease Brother   . Heart disease Sister        CATH,STENT  . Clotting disorder Sister   . Cervical cancer Sister   . Lung cancer Sister   . Heart attack Sister   . Diabetes Sister   . Colon cancer Neg Hx   . Esophageal cancer Neg Hx   . Rectal cancer Neg Hx     Social History   Socioeconomic History  . Marital status: Married    Spouse name: Not on file  . Number of children: 3  . Years of education: Not on file  . Highest education level: Not on file  Occupational History  . Occupation: Surveyor, quantity: [employer]  Tobacco Use  . Smoking status: Former Smoker    Quit date: 06/01/1996    Years since quitting: 24.2  . Smokeless tobacco: Never Used  . Tobacco comment: Quit 1998  Vaping Use  . Vaping Use: Never used  Substance and Sexual Activity  . Alcohol use: No  . Drug use: No  . Sexual activity: Not Currently  Other Topics Concern  . Not on file  Social History Narrative   Daily Caffeine Use:  4   Regular Exercise -  NO      Social Determinants of Health   Financial Resource Strain: High Risk  . Difficulty of Paying Living Expenses: Hard  Food Insecurity: Not on file  Transportation Needs: Not on file  Physical Activity: Not on file  Stress: Not on file  Social Connections: Not on file      Review of Systems : see above  Vital Signs: BP (!) 170/45   Pulse 64   Temp 98.5 F (36.9 C) (Oral)   Resp 18   Ht 5' (1.524 m)   Wt 183 lb (83 kg)   SpO2 97%   BMI 35.74 kg/m   Physical Exam :  awake and alert;  chest-distant breath sounds bilaterally.  Heart with regular rate and rhythm.  Abdomen soft, ventral hernia noted with tenderness/warmth/erythema of overlying skin; ? small eschar inferior portion; mild pretibial edema noted   Imaging: DG Abdomen 1 View  Result Date: 08/08/2020 CLINICAL  DATA:  Abdominal hernia concerns for strangulation. EXAM: ABDOMEN - 1 VIEW COMPARISON:  CT abdomen pelvis February 08, 2020 and abdominal radiograph August 12, 2018. FINDINGS: The bowel gas pattern is normal with gas visualized level of colon. Thoracolumbar spondylosis. IMPRESSION: Nonobstructive bowel gas pattern. Electronically Signed   By: Dahlia Bailiff MD   On: 08/08/2020 18:20   CT ABDOMEN PELVIS W CONTRAST  Result Date: 08/09/2020 CLINICAL DATA:  Persistent abdominal pain from known hernia x3 weeks. EXAM: CT  ABDOMEN AND PELVIS WITH CONTRAST TECHNIQUE: Multidetector CT imaging of the abdomen and pelvis was performed using the standard protocol following bolus administration of intravenous contrast. CONTRAST:  80m OMNIPAQUE IOHEXOL 300 MG/ML  SOLN COMPARISON:  February 08, 2020 FINDINGS: Lower chest: There is atelectasis at the lung bases. There is a fat containing Boch[place]k hernia on the right.The heart size is normal. Hepatobiliary: The liver is normal. Normal gallbladder.There is no biliary ductal dilation. Pancreas: Normal contours without ductal dilatation. No peripancreatic fluid collection. Spleen: There is splenomegaly with the spleen measuring approximately 13 cm craniocaudad. Adrenals/Urinary Tract: --Adrenal glands: Unremarkable. --Right kidney/ureter: There is a solid exophytic mass arising from the interpolar region of the right kidney measuring 3.5 cm (axial series 2, image 30). Multiple cysts are noted throughout the right kidney. There is no right-sided hydronephrosis. --Left kidney/ureter: Multiple cysts are noted. --Urinary bladder: Unremarkable. Stomach/Bowel: --Stomach/Duodenum: No hiatal hernia or other gastric abnormality. Normal duodenal course and caliber. --Small bowel: Unremarkable. --Colon: There is a moderate amount of stool at the level of the rectum. There is scattered colonic diverticula without CT evidence for diverticulitis. Again noted is a large ventral wall hernia containing a  loop of the transverse colon in addition to portions of the patient's appendix. There is now a large air and fluid collection in the hernia sac measuring approximately 9.4 x 7.2 cm. There is significant adjacent fat stranding and overlying skin thickening. --Appendix: The visualized portions of the intraperitoneal appendix are unremarkable. The portions that are herniated are difficult to evaluate. Vascular/Lymphatic: Atherosclerotic calcification is present within the non-aneurysmal abdominal aorta, without hemodynamically significant stenosis. --there are few prominent, nonspecific retroperitoneal lymph nodes. --No mesenteric lymphadenopathy. --No pelvic or inguinal lymphadenopathy. Reproductive: Unremarkable Other: Again noted is a large ventral wall hernia containing portions of the transverse colon as and a large volume of peritoneal fat. Musculoskeletal. There is diffuse osteopenia which limits detection of nondisplaced fractures. There is no definite acute displaced fracture identified on today's study. IMPRESSION: 1. Large ventral wall hernia containing a loop of the transverse colon. While there is no evidence for an obstruction, there is a new large air and fluid collection within the hernia sac concerning for perforation of the transverse colon with resultant abscess formation or complicated diverticulitis. Alternatively, a portion of the appendix is located within the hernia sac. As such, the differential includes complicated appendicitis, although this is felt to be less likely. 2. There is a 3.5 cm right renal mass consistent with renal cell carcinoma until proven otherwise. 3. Splenomegaly. 4. Additional chronic findings as detailed above. Aortic Atherosclerosis (ICD10-I70.0). These results were called by telephone at the time of interpretation on 08/09/2020 at 2:57 am to provider DAN FLOYD , who verbally acknowledged these results. Electronically Signed   By: CConstance HolsterM.D.   On: 08/09/2020  03:00   DG CHEST PORT 1 VIEW  Result Date: 08/09/2020 CLINICAL DATA:  80year old female with history of COVID infection with increasing shortness of breath. EXAM: PORTABLE CHEST 1 VIEW COMPARISON:  Chest x-ray 01/01/2018. FINDINGS: Ill-defined opacities and areas of mild interstitial prominence are noted throughout the mid to lower lungs bilaterally. Small left pleural effusion or chronic pleuroparenchymal scarring, stable compared to remote prior study from 2019. No pneumothorax. No evidence of pulmonary edema. Heart size is mildly enlarged. Upper mediastinal contours are within normal limits. Aortic atherosclerosis. IMPRESSION: 1. There are subtle findings in the mid to lower lungs which suggest developing multilobar bilateral pneumonia, compatible with reported COVID infection. 2.  Mild cardiomegaly. 3. Aortic atherosclerosis. Electronically Signed   By: Vinnie Langton M.D.   On: 08/09/2020 09:14    Labs:  CBC: Recent Labs    05/12/20 0542 05/23/20 1430 08/08/20 1756 08/09/20 0735  WBC 11.7* 10.4 24.6* 20.6*  HGB 9.1* 9.4* 10.4* 8.6*  HCT 29.2* 28.7* 32.2* 27.3*  PLT 195 281.0 340 291    COAGS: No results for input(s): INR, APTT in the last 8760 hours.  BMP: Recent Labs    02/19/20 1359 05/07/20 1036 05/11/20 0435 05/12/20 0542 08/08/20 1756 08/09/20 0735  NA 140   < > 136 136 132* 133*  K 4.1   < > 3.6 3.8 3.4* 3.5  CL 102   < > 103 100 92* 96*  CO2 29   < > 26 28 25 27   GLUCOSE 118*   < > 93 103* 110* 104*  BUN 38*   < > 19 21 35* 33*  CALCIUM 9.0   < > 8.6* 8.8* 9.5 8.6*  CREATININE 1.80*   < > 1.64* 1.64* 1.39* 1.45*  1.44*  GFRNONAA 26*   < > 29* 29* 38* 36*  37*  GFRAA 30*  --   --   --   --   --    < > = values in this interval not displayed.    LIVER FUNCTION TESTS: Recent Labs    02/19/20 1359 05/07/20 1036 05/08/20 0336 08/08/20 1756  BILITOT <0.1* 0.4 1.0 0.7  AST 14* 13* 12* 24  ALT 10 10 9 15   ALKPHOS 59 51 45 66  PROT 7.1 6.6 6.0* 7.6   ALBUMIN 3.6 3.4* 3.1* 3.2*    TUMOR MARKERS: No results for input(s): AFPTM, CEA, CA199, CHROMGRNA in the last 8760 hours.  Assessment and Plan: 80 y.o. female former smoker with significant past medical history including angiodysplasia of the bowel, asthma, PVD, atrial fibrillation, CHF, prior GI bleed, chronic kidney disease, chronic lung disease/ COPD, cor pulmonale, GERD, gout, hypertension, hyperlipidemia, nephrolithiasis, MGUS, morbid obesity, obstructive sleep apnea, ulcerative colitis, vitamin D and B12 deficiency and known ventral hernia who presented to [place] ED on 08/08/20 with fatigue, fever, chills, cough, dyspnea, anorexia, constipation and persistent abdominal pain along with tightness, redness and warmth of skin overlying the hernia.  The hernia does not ever fully reduce and has reportedly been present for over 15 years.  She was subsequently found to be COVID-19 positive.  CT of the abdomen pelvis revealed:   1. Large ventral wall hernia containing a loop of the transverse colon. While there is no evidence for an obstruction, there is a new large air and fluid collection within the hernia sac concerning for perforation of the transverse colon with resultant abscess formation or complicated diverticulitis. Alternatively, a portion of the appendix is located within the hernia sac. As such, the differential includes complicated appendicitis, although this is felt to be less likely. 2. There is a 3.5 cm right renal mass consistent with renal cell carcinoma until proven otherwise. 3. Splenomegaly. Mild cardiomegaly/aortic atherosclerosis  She is currently afebrile, WBC 20.6, hemoglobin 8.6, platelets 291K, potassium 3.5, creatinine 1.45.  Request now received from surgery for image guided drainage of large air and fluid collection within hernia sac. Imaging studies have been reviewed by Dr. Earleen Newport. Risks and benefits discussed with the patient/spouse including bleeding,  infection, damage to adjacent structures, bowel perforation/fistula connection,possible need for surgery and sepsis.  All of the patient's questions were answered, patient is agreeable to proceed.  Consent signed and in chart.  Procedure scheduled for later today.   Thank you for this interesting consult.  I greatly enjoyed meeting ANNISSA ANDREONI and look forward to participating in their care.  A copy of this report was sent to the requesting provider on this date.  Electronically Signed: D. Rowe Robert, PA-C 08/09/2020, 9:48 AM   I spent a total of 20 minutes    in face to face in clinical consultation, greater than 50% of which was counseling/coordinating care for image guided drainage of abdominal fluid collection

## 2020-08-09 NOTE — ED Notes (Signed)
Patient transported to CT 

## 2020-08-09 NOTE — ED Notes (Signed)
Pt has been medicated per MD orders. Pt remains on monitor. [place] daughter has left bedside.

## 2020-08-09 NOTE — Procedures (Signed)
Interventional Radiology Procedure Note  Procedure: Image guided drain placement, anterior abd wall abscess.  25F pigtail drain.  Complications: None  EBL: None Sample: Culture sent  Recommendations: - Routine drain care, with sterile flushes, record output - follow up Cx - routine wound care  Signed,  Dulcy Fanny. Earleen Newport, DO

## 2020-08-09 NOTE — ED Provider Notes (Signed)
[facility] DEPT Provider Note   CSN: [id number] Arrival date & time: 08/08/20  1355     History Chief Complaint  Patient presents with  . Abdominal Pain    [name] is a 80 y.o. female.  80 yo F with a chief complaints of abdominal pain.  This been going on for about 3 weeks now.  Feels like her hernia has been hurting her and has not relented for about 3 weeks.  Having some nausea and vomiting.  Subjectively hot and cold.  Also had diarrhea until about 4 days ago which resolved and now has passed nothing per her bottom.  The history is provided by the patient and a relative.  Illness Severity:  Severe Onset quality:  Sudden Duration:  3 weeks Timing:  Constant Progression:  Worsening Chronicity:  New Associated symptoms: abdominal pain, fever, nausea and vomiting   Associated symptoms: no chest pain, no congestion, no headaches, no myalgias, no rhinorrhea, no shortness of breath and no wheezing        Past Medical History:  Diagnosis Date  . Allergy   . Angiodysplasia of cecum   . Angiodysplasia of duodenum   . Arthritis   . Asteatotic eczema 02/25/2016  . Asthma   . Atrial fibrillation ([place]) 10/17/2017  . Blood transfusion without reported diagnosis   . Cataract   . Chest pain at rest 03/22/2010   Qualifier: Diagnosis of  By: Ronnald Ramp MD, Arvid Right.   . CHF (congestive heart failure) ([place]) 08/31/03  . CHF (congestive heart failure), NYHA class III, chronic, combined ([place]) 08/10/2017  . Chronic venous stasis dermatitis of both lower extremities 06/02/2013  . CKD (chronic kidney disease), stage III ([place]) 06/20/2010   Estimated Creatinine Clearance: 34.8 mL/min (A) (by C-G formula based on SCr of 1.37 mg/dL (H)).   Marland Kitchen COLD (chronic obstructive lung disease) ([place])   . Colonic polyp 02/16/2008   Tubular adenoma  . COPD (chronic obstructive pulmonary disease) ([place]) 01/27/98  . Cor pulmonale ([place]) 11/16/2014  . D-dimer, elevated 01/01/2018  .  GERD 07/30/1992  . Gout   . Heart murmur   . HTN (hypertension) 07/30/1988  . Hyperlipidemia 11/27/96  . Hyperlipidemia with target LDL less than 100 11/27/1996       . Kidney stone 1960, 1972, 1991  . MGUS (monoclonal gammopathy of unknown significance) 11/08/2016  . Morbid obesity ([place]) 04/19/2010   She agrees to work on her lifestyle modifications to help her lose weight.   . Neck pain on right side 01/01/2018  . OSA (obstructive sleep apnea) 06/22/2013  . Osteopenia, senile 02/05/2013   July 2014  -2.1 left femur -1.9 left forearm   . Other screening mammogram 02/05/2013  . Oxygen deficiency   . Prediabetes 11/13/2006  . Renal insufficiency   . Stenosis of cervical spine with myelopathy ([place]) 01/01/2018  . Ulcer of leg, chronic, right ([place]) 10/10/2011  . Ulcerative colitis   . ULCERATIVE COLITIS-LEFT SIDE 03/19/2008       . Vitamin B12 deficiency anemia 11/13/2006       . Vitamin D deficiency 02/06/2013    Patient Active Problem List   Diagnosis Date Noted  . Left lower quadrant abdominal tenderness without rebound tenderness 02/06/2020  . Chronic respiratory failure with hypoxia ([place]) 02/06/2020  . Routine general medical examination at a health care facility 02/06/2020  . Umbilical hernia with obstruction 08/27/2018  . AVM (arteriovenous malformation) of small bowel, acquired with hemorrhage 08/14/2018  . Hernia,  ventral 08/12/2018  . Multiple lung nodules on CT 05/05/2018  . Stenosis of cervical spine with myelopathy ([place]) 01/01/2018  . Atrial fibrillation ([place]) 10/17/2017  . CHF (congestive heart failure), NYHA class III, chronic, diastolic ([place]) 96/28/3662  . Symptomatic anemia 08/10/2017  . Angiodysplasia of cecum   . Angiodysplasia of duodenum   . Grade III diastolic dysfunction 94/76/5465  . MGUS (monoclonal gammopathy of unknown significance) 11/08/2016  . Asteatotic eczema 02/25/2016  . COLD (chronic obstructive lung disease) ([place])   . Anemia, iron deficiency 11/16/2014  .  Cor pulmonale ([place]) 11/16/2014  . OSA (obstructive sleep apnea) 06/22/2013  . Chronic venous stasis dermatitis of both lower extremities 06/02/2013  . Vitamin D deficiency 02/06/2013  . Osteopenia, senile 02/05/2013  . Other screening mammogram 02/05/2013  . Chronic kidney disease, stage 3b ([place]) 06/20/2010  . Morbid obesity ([place]) 04/19/2010  . Gout 01/10/2009  . ULCERATIVE COLITIS-LEFT SIDE 03/19/2008  . Vitamin B12 deficiency anemia 11/13/2006  . Hyperlipidemia with target LDL less than 100 11/27/1996  . GERD 07/30/1992  . HTN (hypertension) 07/30/1988    Past Surgical History:  Procedure Laterality Date  . BRONCHOSCOPY    . COLONOSCOPY    . COLONOSCOPY WITH PROPOFOL N/A 02/23/2015   Procedure: COLONOSCOPY WITH PROPOFOL;  Surgeon: Ladene Artist, MD;  Location: [facility];  Service: Endoscopy;  Laterality: N/A;  . COLONOSCOPY WITH PROPOFOL N/A 08/12/2018   Procedure: COLONOSCOPY WITH PROPOFOL;  Surgeon: Ladene Artist, MD;  Location: [facility];  Service: Endoscopy;  Laterality: N/A;  . COLONOSCOPY WITH PROPOFOL N/A 05/09/2020   Procedure: COLONOSCOPY WITH PROPOFOL;  Surgeon: Ladene Artist, MD;  Location: [facility];  Service: Endoscopy;  Laterality: N/A;  To Splenic Flexture  . ESOPHAGOGASTRODUODENOSCOPY (EGD) WITH PROPOFOL N/A 02/23/2015   Procedure: ESOPHAGOGASTRODUODENOSCOPY (EGD) WITH PROPOFOL;  Surgeon: Ladene Artist, MD;  Location: [facility];  Service: Endoscopy;  Laterality: N/A;  . ESOPHAGOGASTRODUODENOSCOPY (EGD) WITH PROPOFOL N/A 08/12/2018   Procedure: ESOPHAGOGASTRODUODENOSCOPY (EGD) WITH PROPOFOL;  Surgeon: Ladene Artist, MD;  Location: [facility];  Service: Endoscopy;  Laterality: N/A;  . ESOPHAGOGASTRODUODENOSCOPY (EGD) WITH PROPOFOL N/A 05/08/2020   Procedure: ESOPHAGOGASTRODUODENOSCOPY (EGD) WITH PROPOFOL;  Surgeon: Yetta Flock, MD;  Location: [facility];  Service: Gastroenterology;  Laterality: N/A;  . HOT HEMOSTASIS N/A 08/12/2018    Procedure: HOT HEMOSTASIS (ARGON PLASMA COAGULATION/BICAP);  Surgeon: Ladene Artist, MD;  Location: [facility];  Service: Endoscopy;  Laterality: N/A;  EGD and Colon APC  . HOT HEMOSTASIS N/A 05/08/2020   Procedure: HOT HEMOSTASIS (ARGON PLASMA COAGULATION/BICAP);  Surgeon: Yetta Flock, MD;  Location: [facility];  Service: Gastroenterology;  Laterality: N/A;  . NO PAST SURGERIES    . POLYPECTOMY       OB History   No obstetric history on file.     Family History  Problem Relation Age of Onset  . Stroke Mother   . Diabetes Mother   . Kidney cancer Mother   . Stomach cancer Mother   . Heart disease Mother   . Stroke Father   . Heart disease Brother   . Heart disease Brother   . Crohn's disease Brother   . Heart disease Sister        CATH,STENT  . Clotting disorder Sister   . Cervical cancer Sister   . Lung cancer Sister   . Heart attack Sister   . Diabetes Sister   . Colon cancer Neg Hx   . Esophageal cancer Neg Hx   .  Rectal cancer Neg Hx     Social History   Tobacco Use  . Smoking status: Former Smoker    Quit date: 06/01/1996    Years since quitting: 24.2  . Smokeless tobacco: Never Used  . Tobacco comment: Quit 1998  Vaping Use  . Vaping Use: Never used  Substance Use Topics  . Alcohol use: No  . Drug use: No    Home Medications Prior to Admission medications   Medication Sig Start Date End Date Taking? Authorizing Provider  acetaminophen (TYLENOL) 325 MG tablet Take 325-650 mg by mouth daily as needed for moderate pain, fever or headache.    Yes [provider]  albuterol (PROAIR HFA) 108 (90 Base) MCG/ACT inhaler Inhale 1-2 puffs into the lungs every 6 (six) hours as needed for wheezing or shortness of breath. 10/30/16  Yes [name], MD  allopurinol (ZYLOPRIM) 100 MG tablet Take 1 tablet (100 mg total) by mouth daily. 05/29/20  Yes [name], MD  cefdinir (OMNICEF) 300 MG capsule Take 1 capsule (300 mg total) by mouth 2  (two) times daily. 08/03/20  Yes Saguier, Percell Miller, PA-C  ferrous sulfate 325 (65 FE) MG tablet Take 1 tablet (325 mg total) by mouth 2 (two) times daily with a meal. 11/13/19  Yes [name], MD  fluticasone furoate-vilanterol (BREO ELLIPTA) 100-25 MCG/INH AEPB INHALE 1 PUFF INTO THE LUNGS DAILY 05/23/20  Yes [name], MD  isosorbide-hydrALAZINE (BIDIL) 20-37.5 MG tablet Take 1 tablet by mouth 3 (three) times daily. Patient taking differently: Take 1 tablet by mouth 2 (two) times daily. 05/18/20  Yes [name], MD  metoprolol tartrate (LOPRESSOR) 25 MG tablet Take 1 tablet (25 mg total) by mouth 2 (two) times daily. Patient taking differently: Take 12.5 mg by mouth 2 (two) times daily. 05/23/20  Yes [name], MD  metroNIDAZOLE (FLAGYL) 500 MG tablet Take 1 tablet (500 mg total) by mouth 3 (three) times daily. 08/03/20  Yes Saguier, Percell Miller, PA-C  Pitavastatin Magnesium (ZYPITAMAG) 2 MG TABS Take 1 tablet by mouth daily. 06/27/20  Yes [name], MD  torsemide (DEMADEX) 20 MG tablet TAKE 1 TABLET(20 MG) BY MOUTH TWICE DAILY Patient taking differently: Take 20 mg by mouth 2 (two) times daily. 07/27/20  Yes [name], MD    Allergies    Amoxicillin, Celebrex [celecoxib], Amlodipine besylate, Enalapril, Lipitor [atorvastatin], Codeine sulfate, and Hydrocodone-acetaminophen  Review of Systems   Review of Systems  Constitutional: Positive for chills and fever.  HENT: Negative for congestion and rhinorrhea.   Eyes: Negative for redness and visual disturbance.  Respiratory: Negative for shortness of breath and wheezing.   Cardiovascular: Negative for chest pain and palpitations.  Gastrointestinal: Positive for abdominal pain, nausea and vomiting.  Genitourinary: Negative for dysuria and urgency.  Musculoskeletal: Negative for arthralgias and myalgias.  Skin: Negative for pallor and wound.  Neurological: Negative for dizziness and headaches.    Physical Exam Updated  Vital Signs BP (!) 198/64 (BP Location: Right Arm)   Pulse 88   Temp 98.4 F (36.9 C) (Oral)   Resp (!) 21   Ht 5' (1.524 m)   Wt 83 kg   SpO2 99%   BMI 35.74 kg/m   Physical Exam Vitals and nursing note reviewed.  Constitutional:      General: She is not in acute distress.    Appearance: She is well-developed and well-nourished. She is not diaphoretic.  HENT:     Head: Normocephalic  and atraumatic.  Eyes:     Extraocular Movements: EOM normal.     Pupils: Pupils are equal, round, and reactive to light.  Cardiovascular:     Rate and Rhythm: Normal rate and regular rhythm.     Heart sounds: No murmur heard. No friction rub. No gallop.   Pulmonary:     Effort: Pulmonary effort is normal.     Breath sounds: No wheezing or rales.  Abdominal:     General: There is no distension.     Palpations: Abdomen is soft.     Tenderness: There is abdominal tenderness.     Comments: Large hernia appears to be periumbilical, there are overlying skin erythema and duskiness.  Musculoskeletal:        General: No tenderness or edema.     Cervical back: Normal range of motion and neck supple.  Skin:    General: Skin is warm and dry.  Neurological:     Mental Status: She is alert and oriented to person, place, and time.  Psychiatric:        Mood and Affect: Mood and affect normal.        Behavior: Behavior normal.     ED Results / Procedures / Treatments   Labs (all labs ordered are listed, but only abnormal results are displayed) Labs Reviewed  COMPREHENSIVE METABOLIC PANEL - Abnormal; Notable for the following components:      Result Value   Sodium 132 (*)    Potassium 3.4 (*)    Chloride 92 (*)    Glucose, Bld 110 (*)    BUN 35 (*)    Creatinine, Ser 1.39 (*)    Albumin 3.2 (*)    GFR, Estimated 38 (*)    All other components within normal limits  CBC - Abnormal; Notable for the following components:   WBC 24.6 (*)    Hemoglobin 10.4 (*)    HCT 32.2 (*)    MCV 78.7 (*)     MCH 25.4 (*)    All other components within normal limits  RESP PANEL BY RT-PCR (FLU A&B, COVID) ARPGX2  LIPASE, BLOOD  LACTIC ACID, PLASMA  URINALYSIS, ROUTINE W REFLEX MICROSCOPIC  LACTIC ACID, PLASMA    EKG None  Radiology DG Abdomen 1 View  Result Date: 08/08/2020 CLINICAL DATA:  Abdominal hernia concerns for strangulation. EXAM: ABDOMEN - 1 VIEW COMPARISON:  CT abdomen pelvis February 08, 2020 and abdominal radiograph August 12, 2018. FINDINGS: The bowel gas pattern is normal with gas visualized level of colon. Thoracolumbar spondylosis. IMPRESSION: Nonobstructive bowel gas pattern. Electronically Signed   By: Dahlia Bailiff MD   On: 08/08/2020 18:20   CT ABDOMEN PELVIS W CONTRAST  Result Date: 08/09/2020 CLINICAL DATA:  Persistent abdominal pain from known hernia x3 weeks. EXAM: CT ABDOMEN AND PELVIS WITH CONTRAST TECHNIQUE: Multidetector CT imaging of the abdomen and pelvis was performed using the standard protocol following bolus administration of intravenous contrast. CONTRAST:  7m OMNIPAQUE IOHEXOL 300 MG/ML  SOLN COMPARISON:  February 08, 2020 FINDINGS: Lower chest: There is atelectasis at the lung bases. There is a fat containing Bochdalek hernia on the right.The heart size is normal. Hepatobiliary: The liver is normal. Normal gallbladder.There is no biliary ductal dilation. Pancreas: Normal contours without ductal dilatation. No peripancreatic fluid collection. Spleen: There is splenomegaly with the spleen measuring approximately 13 cm craniocaudad. Adrenals/Urinary Tract: --Adrenal glands: Unremarkable. --Right kidney/ureter: There is a solid exophytic mass arising from the interpolar region of the right kidney  measuring 3.5 cm (axial series 2, image 30). Multiple cysts are noted throughout the right kidney. There is no right-sided hydronephrosis. --Left kidney/ureter: Multiple cysts are noted. --Urinary bladder: Unremarkable. Stomach/Bowel: --Stomach/Duodenum: No hiatal hernia or  other gastric abnormality. Normal duodenal course and caliber. --Small bowel: Unremarkable. --Colon: There is a moderate amount of stool at the level of the rectum. There is scattered colonic diverticula without CT evidence for diverticulitis. Again noted is a large ventral wall hernia containing a loop of the transverse colon in addition to portions of the patient's appendix. There is now a large air and fluid collection in the hernia sac measuring approximately 9.4 x 7.2 cm. There is significant adjacent fat stranding and overlying skin thickening. --Appendix: The visualized portions of the intraperitoneal appendix are unremarkable. The portions that are herniated are difficult to evaluate. Vascular/Lymphatic: Atherosclerotic calcification is present within the non-aneurysmal abdominal aorta, without hemodynamically significant stenosis. --there are few prominent, nonspecific retroperitoneal lymph nodes. --No mesenteric lymphadenopathy. --No pelvic or inguinal lymphadenopathy. Reproductive: Unremarkable Other: Again noted is a large ventral wall hernia containing portions of the transverse colon as and a large volume of peritoneal fat. Musculoskeletal. There is diffuse osteopenia which limits detection of nondisplaced fractures. There is no definite acute displaced fracture identified on today's study. IMPRESSION: 1. Large ventral wall hernia containing a loop of the transverse colon. While there is no evidence for an obstruction, there is a new large air and fluid collection within the hernia sac concerning for perforation of the transverse colon with resultant abscess formation or complicated diverticulitis. Alternatively, a portion of the appendix is located within the hernia sac. As such, the differential includes complicated appendicitis, although this is felt to be less likely. 2. There is a 3.5 cm right renal mass consistent with renal cell carcinoma until proven otherwise. 3. Splenomegaly. 4. Additional  chronic findings as detailed above. Aortic Atherosclerosis (ICD10-I70.0). These results were called by telephone at the time of interpretation on 08/09/2020 at 2:57 am to provider Sathvik Tiedt , who verbally acknowledged these results. Electronically Signed   By: Constance Holster M.D.   On: 08/09/2020 03:00    Procedures Procedures (including critical care time)  Medications Ordered in ED Medications  piperacillin-tazobactam (ZOSYN) IVPB 3.375 g (has no administration in time range)  HYDROmorphone (DILAUDID) injection 0.5 mg (0.5 mg Intravenous Given 08/09/20 0148)  ondansetron (ZOFRAN) injection 4 mg (4 mg Intravenous Given 08/09/20 0148)  sodium chloride 0.9 % bolus 1,000 mL (0 mLs Intravenous Stopped 08/09/20 0321)  iohexol (OMNIPAQUE) 300 MG/ML solution 100 mL (80 mLs Intravenous Contrast Given 08/09/20 0228)  HYDROmorphone (DILAUDID) injection 0.5 mg (0.5 mg Intravenous Given 08/09/20 1884)    ED Course  I have reviewed the triage vital signs and the nursing notes.  Pertinent labs & imaging results that were available during my care of the patient were reviewed by me and considered in my medical decision making (see chart for details).    MDM Rules/Calculators/A&P                          80 yo F with a chief complaint of abdominal pain.  Thinks it is to her hernia site.  On exam the patient has signs of skin breakdown.  Having pain for 3 weeks now.  We will obtain a CT scan.  CT scan with abscess inside of the hernia.  Unsure from appendix or colon.    Will start on IV abx.  Discuss with surgery, Dr. Harlow Asa.  Recommended medical admission and surgery would see this morning.   The patients results and plan were reviewed and discussed.   Any x-rays performed were independently reviewed by myself.   Differential diagnosis were considered with the presenting HPI.  Medications  piperacillin-tazobactam (ZOSYN) IVPB 3.375 g (has no administration in time range)  HYDROmorphone (DILAUDID)  injection 0.5 mg (0.5 mg Intravenous Given 08/09/20 0148)  ondansetron (ZOFRAN) injection 4 mg (4 mg Intravenous Given 08/09/20 0148)  sodium chloride 0.9 % bolus 1,000 mL (0 mLs Intravenous Stopped 08/09/20 0321)  iohexol (OMNIPAQUE) 300 MG/ML solution 100 mL (80 mLs Intravenous Contrast Given 08/09/20 0228)  HYDROmorphone (DILAUDID) injection 0.5 mg (0.5 mg Intravenous Given 08/09/20 0317)    Vitals:   08/08/20 1748 08/09/20 0025 08/09/20 0149 08/09/20 0321  BP: (!) 188/74 (!) 220/69 (!) 183/76 (!) 198/64  Pulse: (!) 104 (!) 108 86 88  Resp: 16 20 (!) 22 (!) 21  Temp:      TempSrc:      SpO2: 97% 98% 100% 99%  Weight:      Height:        Final diagnoses:  Umbilical hernia, incarcerated  Abscess of abdominal cavity (HCC)    Admission/ observation were discussed with the admitting physician, patient and/or family and they are comfortable with the plan.   Final Clinical Impression(s) / ED Diagnoses Final diagnoses:  Umbilical hernia, incarcerated  Abscess of abdominal cavity [facility])    Rx / DC Orders ED Discharge Orders    None       Deno Etienne, DO 08/09/20 0423

## 2020-08-09 NOTE — Consult Note (Signed)
NAME:  [name], MRN:  [id number], DOB:  [birth year], LOS: 0 ADMISSION DATE:  08/09/2020, CONSULTATION DATE: 08/09/20  REFERRING MD:  Dr. Ninfa Linden , CHIEF COMPLAINT: Abdominal Pain   Brief History:  80 y/o F with known incarcerated ventral hernia admitted 1/11 to [facility] with 3 weeks of abdominal pain.  Work up found her to have a fluid collection in known ventral hernia sac.  She was also found to be COVID positive.   History of Present Illness:  80 y/o F with complex medical history to include a known ventral hernia for approximately 15 years who presented to [facility] ER on 1/11 with reports of 3 weeks of progressive abdominal pain.  She described her abdomen as being more tight and had noted redness and warmth over the hernia site.  She states she has had diarrhea but it stopped approximately 2 days prior to admit.  Additional symptoms include fever, chills, dysuria, and decreased appetite.  She did not seek medical attention as she was afraid to come to the hospital.  However, she did call here PCP seeking antibiotics and was told to go to the ER.  She reported some increase in baseline shortness of breath and cough but no change in O2 needs.    On admit, CT of the abdomen was assessed which was concerning for a large ventral wall hernia containing a loop of the transverse colon. No evidence of obstruction but there is a new large air and fluid collection within the hernia sac concerning for perforation of the transverse colon with abscess or complicated diverticulitis or complicated appendicitis. 3.5 CM right renal mass consistent with renal cell carcinoma.  Additionally, she was found to be COVID positive.  Imaging review of lung bases on abdominal images does not show any acute infiltrates. She is on her baseline O2 needs.  Interventional Radiology was consulted by [facility] to evaluate for drain placement. On 1/11 she underwent image guided drain placement into anterior abdominal wall abscess.  Cultures  were sent.   PCCM consulted for pre-operative pulmonary clearance.   Past Medical History:  B12 Anemia  Ulcerative Colitis - left side  Morbid Obesity  Incarcerated Ventral Hernia  Pre Diabetes  MGUS  HTN  HLD  CHF - chronic combined class III  Atrial Fibrillation  Cor Pulmonale  COPD  Oxygen Dependent Respiratory Failure  OSA  Kidney Stones   Significant Hospital Events:  1/11 Admit with abdominal pain in setting of new fluid collection in ventral hernia, COVID positive   Consults:  Cardiology  [facility] PCCM   Procedures:    Significant Diagnostic Tests:   CT ABD / Pelvis 1/11 >> large ventral wall hernia containing a loop of the transverse colon. No evidence of obstruction but there is a new large air and fluid collection within the hernia sac concerning for perforation of the transverse colon with abscess or complicated diverticulitis or complicated appendicitis. 3.5 CM right renal mass consistent with renal cell carcinoma. Splenomegaly.   Micro Data:  COVID 1/11 >> positive Influenza A/B 1/11 >> negative  Abdominal Wall Abscess 1/11 >>   Antimicrobials:   Anti-infectives (From admission, onward)   Start     Dose/Rate Route Frequency Ordered Stop   08/10/20 1000  remdesivir 100 mg in sodium chloride 0.9 % 100 mL IVPB       "Followed by" Linked Group Details   100 mg 200 mL/hr over 30 Minutes Intravenous Daily 08/09/20 1045 08/14/20 0959   08/09/20 1200  remdesivir  200 mg in sodium chloride 0.9% 250 mL IVPB       "Followed by" Linked Group Details   200 mg 580 mL/hr over 30 Minutes Intravenous Once 08/09/20 1045 08/09/20 1430   08/09/20 0745  piperacillin-tazobactam (ZOSYN) IVPB 3.375 g        3.375 g 12.5 mL/hr over 240 Minutes Intravenous Every 8 hours 08/09/20 0735     08/09/20 0315  piperacillin-tazobactam (ZOSYN) IVPB 3.375 g        3.375 g 100 mL/hr over 30 Minutes Intravenous  Once 08/09/20 0306 08/09/20 0515       Interim History / Subjective:   08/09/2020 - seen in ER  bed 10   Objective   Blood pressure (!) 177/52, pulse 62, temperature 98.2 F (36.8 C), temperature source Oral, resp. rate 16, height 5' (1.524 m), weight 83 kg, SpO2 100 %.        Intake/Output Summary (Last 24 hours) at 08/09/2020 1549 Last data filed at 08/09/2020 1430 Gross per 24 hour  Intake 1400 ml  Output --  Net 1400 ml   Filed Weights   08/08/20 1413  Weight: 83 kg    Examination: General: deconditioned lady in bed. In ER HENT: On [place] Lungs: Distan breath sounds.  Cardiovascular: Tachy Abdomen: Verntral  Hernia with drain Extremities: intact Neuro: alert and oriented GU: not examined    LABS    PULMONARY No results for input(s): PHART, PCO2ART, PO2ART, HCO3, TCO2, O2SAT in the last 168 hours.  Invalid input(s): PCO2, PO2  CBC Recent Labs  Lab 08/08/20 1756 08/09/20 0735  HGB 10.4* 8.6*  HCT 32.2* 27.3*  WBC 24.6* 20.6*  PLT 340 291    COAGULATION Recent Labs  Lab 08/09/20 0957  INR 1.2    CARDIAC  No results for input(s): TROPONINI in the last 168 hours. No results for input(s): PROBNP in the last 168 hours.   CHEMISTRY Recent Labs  Lab 08/08/20 1756 08/09/20 0735  NA 132* 133*  K 3.4* 3.5  CL 92* 96*  CO2 25 27  GLUCOSE 110* 104*  BUN 35* 33*  CREATININE 1.39* 1.45*  1.44*  CALCIUM 9.5 8.6*   Estimated Creatinine Clearance: 29.8 mL/min (A) (by C-G formula based on SCr of 1.44 mg/dL (H)).   LIVER Recent Labs  Lab 08/08/20 1756 08/09/20 0957  AST 24  --   ALT 15  --   ALKPHOS 66  --   BILITOT 0.7  --   PROT 7.6  --   ALBUMIN 3.2*  --   INR  --  1.2     INFECTIOUS Recent Labs  Lab 08/09/20 0122 08/09/20 0508  LATICACIDVEN 1.1 0.7     ENDOCRINE CBG (last 3)  No results for input(s): GLUCAP in the last 72 hours.       IMAGING x48h  - image(s) personally visualized  -   highlighted in bold DG Abdomen 1 View  Result Date: 08/08/2020 CLINICAL DATA:  Abdominal hernia  concerns for strangulation. EXAM: ABDOMEN - 1 VIEW COMPARISON:  CT abdomen pelvis February 08, 2020 and abdominal radiograph August 12, 2018. FINDINGS: The bowel gas pattern is normal with gas visualized level of colon. Thoracolumbar spondylosis. IMPRESSION: Nonobstructive bowel gas pattern. Electronically Signed   By: Dahlia Bailiff MD   On: 08/08/2020 18:20   CT ABDOMEN PELVIS W CONTRAST  Result Date: 08/09/2020 CLINICAL DATA:  Persistent abdominal pain from known hernia x3 weeks. EXAM: CT ABDOMEN AND PELVIS WITH CONTRAST TECHNIQUE: Multidetector CT  imaging of the abdomen and pelvis was performed using the standard protocol following bolus administration of intravenous contrast. CONTRAST:  1m OMNIPAQUE IOHEXOL 300 MG/ML  SOLN COMPARISON:  February 08, 2020 FINDINGS: Lower chest: There is atelectasis at the lung bases. There is a fat containing Bochdalek hernia on the right.The heart size is normal. Hepatobiliary: The liver is normal. Normal gallbladder.There is no biliary ductal dilation. Pancreas: Normal contours without ductal dilatation. No peripancreatic fluid collection. Spleen: There is splenomegaly with the spleen measuring approximately 13 cm craniocaudad. Adrenals/Urinary Tract: --Adrenal glands: Unremarkable. --Right kidney/ureter: There is a solid exophytic mass arising from the interpolar region of the right kidney measuring 3.5 cm (axial series 2, image 30). Multiple cysts are noted throughout the right kidney. There is no right-sided hydronephrosis. --Left kidney/ureter: Multiple cysts are noted. --Urinary bladder: Unremarkable. Stomach/Bowel: --Stomach/Duodenum: No hiatal hernia or other gastric abnormality. Normal duodenal course and caliber. --Small bowel: Unremarkable. --Colon: There is a moderate amount of stool at the level of the rectum. There is scattered colonic diverticula without CT evidence for diverticulitis. Again noted is a large ventral wall hernia containing a loop of the transverse  colon in addition to portions of the patient's appendix. There is now a large air and fluid collection in the hernia sac measuring approximately 9.4 x 7.2 cm. There is significant adjacent fat stranding and overlying skin thickening. --Appendix: The visualized portions of the intraperitoneal appendix are unremarkable. The portions that are herniated are difficult to evaluate. Vascular/Lymphatic: Atherosclerotic calcification is present within the non-aneurysmal abdominal aorta, without hemodynamically significant stenosis. --there are few prominent, nonspecific retroperitoneal lymph nodes. --No mesenteric lymphadenopathy. --No pelvic or inguinal lymphadenopathy. Reproductive: Unremarkable Other: Again noted is a large ventral wall hernia containing portions of the transverse colon as and a large volume of peritoneal fat. Musculoskeletal. There is diffuse osteopenia which limits detection of nondisplaced fractures. There is no definite acute displaced fracture identified on today's study. IMPRESSION: 1. Large ventral wall hernia containing a loop of the transverse colon. While there is no evidence for an obstruction, there is a new large air and fluid collection within the hernia sac concerning for perforation of the transverse colon with resultant abscess formation or complicated diverticulitis. Alternatively, a portion of the appendix is located within the hernia sac. As such, the differential includes complicated appendicitis, although this is felt to be less likely. 2. There is a 3.5 cm right renal mass consistent with renal cell carcinoma until proven otherwise. 3. Splenomegaly. 4. Additional chronic findings as detailed above. Aortic Atherosclerosis (ICD10-I70.0). These results were called by telephone at the time of interpretation on 08/09/2020 at 2:57 am to provider DAN FLOYD , who verbally acknowledged these results. Electronically Signed   By: CConstance HolsterM.D.   On: 08/09/2020 03:00   DG CHEST PORT  1 VIEW  Result Date: 08/09/2020 CLINICAL DATA:  80year old female with history of COVID infection with increasing shortness of breath. EXAM: PORTABLE CHEST 1 VIEW COMPARISON:  Chest x-ray 01/01/2018. FINDINGS: Ill-defined opacities and areas of mild interstitial prominence are noted throughout the mid to lower lungs bilaterally. Small left pleural effusion or chronic pleuroparenchymal scarring, stable compared to remote prior study from 2019. No pneumothorax. No evidence of pulmonary edema. Heart size is mildly enlarged. Upper mediastinal contours are within normal limits. Aortic atherosclerosis. IMPRESSION: 1. There are subtle findings in the mid to lower lungs which suggest developing multilobar bilateral pneumonia, compatible with reported COVID infection. 2. Mild cardiomegaly. 3. Aortic atherosclerosis. Electronically Signed  By: Vinnie Langton M.D.   On: 08/09/2020 09:14   [place] IMAGE GUIDED FLUID DRAIN BY CATHETER  Result Date: 08/09/2020 INDICATION: 80 year old female with a history incarcerated ventral hernia containing colon and appendix, now with abscess, presumably from appendicitis EXAM: ULTRASOUND-GUIDED DRAINAGE OF ABDOMINAL WALL ABSCESS MEDICATIONS: The patient is currently admitted to the hospital and receiving intravenous antibiotics. The antibiotics were administered within an appropriate time frame prior to the initiation of the procedure. ANESTHESIA/SEDATION: Fentanyl 100 mcg IV; Versed 1.0 mg IV Moderate Sedation Time:  12 minutes The patient was continuously monitored during the procedure by the interventional radiology nurse under my direct supervision. COMPLICATIONS: None PROCEDURE: Informed written consent was obtained from the patient after a thorough discussion of the procedural risks, benefits and alternatives. All questions were addressed. Maximal Sterile Barrier Technique was utilized including caps, mask, sterile gowns, sterile gloves, sterile drape, hand hygiene and skin  antiseptic. A timeout was performed prior to the initiation of the procedure. Patient positioned supine position on the ultrasound gantry. Scout images were acquired for planning purposes. The patient is prepped and draped in the usual sterile fashion. 1% lidocaine was used for local anesthesia. Once the patient was anesthetized, small stab incision was made with 11 blade scalpel. Trocar technique was used to place a 12 [place] drain into the abscess with complex fluid in the ventral wall. The 12 French catheter was advanced from the trocar and formed in the abscess. Approximately 150 cc of foul-smelling dark fluid aspirated. Culture was sent. Catheter was sutured in position and a final image was stored. Catheter was attached to bulb suction. Patient tolerated the procedure well and remained hemodynamically stable throughout. No complications were encountered and no significant blood loss. IMPRESSION: Status post ultrasound-guided drainage of ventral wall abscess with sample sent for culture. Signed, Dulcy Fanny. Dellia Nims, RPVI [facility] Electronically Signed   By: Corrie Mckusick D.O.   On: 08/09/2020 16:27     Resolved Hospital Problem list   x  Assessment & Plan:  Preop pulmonary consult - for major abdominal surgery as follows    1) RISK FOR PROLONGED MECHANICAL VENTILAION - > 48h  1A) Arozullah - Prolonged mech ventilation risk Arozullah Postperative Pulmonary Risk Score - for mech ventilation dependence >48h [employer], [place] [facility] 2000, major non-cardiac surgery) Comment as of 08/09/20 Score  Type of surgery - abd ao aneurysm (27), thoracic (21), neurosurgery / upper abdominal / vascular (21), neck (11) Upper abdo 21  Emergency Surgery - (11) If done yes, emergency 11  ALbumin < 3 or poor nutritional state - (9) Alb 3.2 but will go down by few days 0  BUN > 30 -  (8) Bun 33 8  Partial or completely dependent functional status -  (7) Drove car as of xmas 2022 but does use walker 0  COPD -  (6) Yes on 2L 6  Age - 60 to 80 (4), > 70  (6) Age 52 6  TOTAL  52  Risk Stratifcation scores  - < 10 (0.5%), 11-19 (1.8%), 20-27 (4.2%), 28-40 (10.1%), >40 (26.6%)  Very high risk      1B) GUPTA - Prolonged Mech Vent Risk Score source Risk  Guptal post op prolonged mech ventilation > 48h or reintubation < 30 days - ACS 2007-2008 dataset - http://lewis-perez.info/ 18-22% for ventral v other abd emergency surgery    2) RISK FOR POST OP PNEUMONIA Score source Risk  [facility] Safe - Post Op Pnemounia  risk  [email or web address] 10% risk     PLAN Risk woprsening factors are - age, covid, renal injury, copd and recent anemoia  Risk ameliorating factors are - currently normal albumin but if this drops below 3 risk goes up. If her kidney function risk can improve. And her preadmit functional status was decent  She is at very high risk for Vent dependence (prolonged) after majro abd surgery and all other pulm complication.   Recommend 1. Short duration of surgery as much as possible and avoid paralytic if possible 2. Recovery in  ICU with Pulmonary consultation 3. DVT prophylaxis 4. Aggressive pulmonary toilet with o2, bronchodilatation, and incentive spirometry and early ambulation 5. If surgery needs to be done - shared decision making needed     Best practice (evaluated daily)  pe triad  Goals of Care:  Per triad      SIGNATURE    Dr. Brand Males, M.D., F.C.C.P,  Pulmonary and Critical Care Medicine Staff Physician, [place] Director - Interstitial Lung Disease  Program  Pulmonary [place] at [place], [place], [address]  Pager: [phone], If no answer  OR between  19:00-7:00h: page [phone] Telephone (clinical office): [phone] Telephone (research): [phone]  6:36 PM 08/09/2020

## 2020-08-09 NOTE — ED Notes (Signed)
IV access has been established and pt has been medicated . Pt's daughter is with pt at bedside for support. Labs sent. Pt on monitor x4, will continue to  Monitor.

## 2020-08-10 DIAGNOSIS — N1832 Chronic kidney disease, stage 3b: Secondary | ICD-10-CM | POA: Diagnosis not present

## 2020-08-10 DIAGNOSIS — K436 Other and unspecified ventral hernia with obstruction, without gangrene: Secondary | ICD-10-CM | POA: Diagnosis not present

## 2020-08-10 DIAGNOSIS — I5032 Chronic diastolic (congestive) heart failure: Secondary | ICD-10-CM | POA: Diagnosis not present

## 2020-08-10 DIAGNOSIS — Z515 Encounter for palliative care: Secondary | ICD-10-CM

## 2020-08-10 DIAGNOSIS — U071 COVID-19: Secondary | ICD-10-CM | POA: Diagnosis not present

## 2020-08-10 DIAGNOSIS — K651 Peritoneal abscess: Secondary | ICD-10-CM | POA: Diagnosis not present

## 2020-08-10 DIAGNOSIS — Z7189 Other specified counseling: Secondary | ICD-10-CM | POA: Diagnosis not present

## 2020-08-10 DIAGNOSIS — I10 Essential (primary) hypertension: Secondary | ICD-10-CM | POA: Diagnosis not present

## 2020-08-10 LAB — CBC
HCT: 28.3 % — ABNORMAL LOW (ref 36.0–46.0)
Hemoglobin: 8.8 g/dL — ABNORMAL LOW (ref 12.0–15.0)
MCH: 24.9 pg — ABNORMAL LOW (ref 26.0–34.0)
MCHC: 31.1 g/dL (ref 30.0–36.0)
MCV: 79.9 fL — ABNORMAL LOW (ref 80.0–100.0)
Platelets: 292 10*3/uL (ref 150–400)
RBC: 3.54 MIL/uL — ABNORMAL LOW (ref 3.87–5.11)
RDW: 14.4 % (ref 11.5–15.5)
WBC: 19 10*3/uL — ABNORMAL HIGH (ref 4.0–10.5)
nRBC: 0 % (ref 0.0–0.2)

## 2020-08-10 LAB — COMPREHENSIVE METABOLIC PANEL
ALT: 14 U/L (ref 0–44)
AST: 18 U/L (ref 15–41)
Albumin: 2.2 g/dL — ABNORMAL LOW (ref 3.5–5.0)
Alkaline Phosphatase: 51 U/L (ref 38–126)
Anion gap: 12 (ref 5–15)
BUN: 40 mg/dL — ABNORMAL HIGH (ref 8–23)
CO2: 27 mmol/L (ref 22–32)
Calcium: 9 mg/dL (ref 8.9–10.3)
Chloride: 97 mmol/L — ABNORMAL LOW (ref 98–111)
Creatinine, Ser: 1.65 mg/dL — ABNORMAL HIGH (ref 0.44–1.00)
GFR, Estimated: 31 mL/min — ABNORMAL LOW (ref >60)
Glucose, Bld: 125 mg/dL — ABNORMAL HIGH (ref 70–99)
Potassium: 4.1 mmol/L (ref 3.5–5.1)
Sodium: 136 mmol/L (ref 135–145)
Total Bilirubin: 0.4 mg/dL (ref 0.3–1.2)
Total Protein: 6 g/dL — ABNORMAL LOW (ref 6.5–8.1)

## 2020-08-10 LAB — FERRITIN: Ferritin: 198 ng/mL (ref 11–307)

## 2020-08-10 LAB — D-DIMER, QUANTITATIVE: D-Dimer, Quant: 2.52 ug/mL-FEU — ABNORMAL HIGH (ref 0.00–0.50)

## 2020-08-10 LAB — C-REACTIVE PROTEIN: CRP: 22.8 mg/dL — ABNORMAL HIGH (ref <1.0)

## 2020-08-10 MED ORDER — SODIUM CHLORIDE 0.9% FLUSH
5.0000 mL | Freq: Three times a day (TID) | INTRAVENOUS | Status: DC
  Administered 2020-08-10 – 2020-08-19 (×17): 5 mL

## 2020-08-10 NOTE — Progress Notes (Signed)
Subjective: CC: Patient reports continued severe pain over hernia.  Having some nausea.  Denies flatus or BM.  She notes that her shortness of breath has improved since yesterday. No cough or chest pain.   Objective: Vital signs in last 24 hours: Temp:  [97.7 F (36.5 C)-98.9 F (37.2 C)] 97.7 F (36.5 C) (01/12 0818) Pulse Rate:  [51-67] 55 (01/12 0818) Resp:  [14-22] 17 (01/12 0818) BP: (141-190)/(42-98) 163/48 (01/12 0818) SpO2:  [95 %-100 %] 97 % (01/12 0818) Weight:  [86.1 kg] 86.1 kg (01/11 2329) Last BM Date: 08/04/20  Intake/Output from previous day: 01/11 0701 - 01/12 0700 In: 893.5 [I.V.:450; IV Piggyback:443.5] Out: 100 [Drains:100] Intake/Output this shift: No intake/output data recorded.  PE: Gen: elderly, chronicly ill appearing female Heart: reg rate Lungs: distant at bases. Clear upper field b/l. Normal rate and effort. On O2 Abd: Patient very tender to touch over ventral hernia and she repeatedly pushes my hands away. She has noted redness and heat over lower abdominal wall with some blistering containing dark fluid as noted in the picture below. Hernia is not reducible and is tense. Drain with milky brown fluid that smells of stool.  Msk: Chronic venous stasis changes of the LE's b/l. No edema.         Lab Results:  Recent Labs    08/09/20 0735 08/10/20 0452  WBC 20.6* 19.0*  HGB 8.6* 8.8*  HCT 27.3* 28.3*  PLT 291 292   BMET Recent Labs    08/09/20 0735 08/10/20 0452  NA 133* 136  K 3.5 4.1  CL 96* 97*  CO2 27 27  GLUCOSE 104* 125*  BUN 33* 40*  CREATININE 1.45*  1.44* 1.65*  CALCIUM 8.6* 9.0   PT/INR Recent Labs    08/09/20 0957  LABPROT 14.4  INR 1.2   CMP     Component Value Date/Time   NA 136 08/10/2020 0452   NA 142 04/11/2016 1007   K 4.1 08/10/2020 0452   K 3.3 (L) 04/11/2016 1007   CL 97 (L) 08/10/2020 0452   CO2 27 08/10/2020 0452   CO2 31 (H) 04/11/2016 1007   GLUCOSE 125 (H) 08/10/2020 0452    GLUCOSE 78 04/11/2016 1007   BUN 40 (H) 08/10/2020 0452   BUN 24.4 04/11/2016 1007   CREATININE 1.65 (H) 08/10/2020 0452   CREATININE 1.36 (H) 02/04/2020 1443   CREATININE 1.1 04/11/2016 1007   CALCIUM 9.0 08/10/2020 0452   CALCIUM 9.8 04/11/2016 1007   PROT 6.0 (L) 08/10/2020 0452   PROT 6.9 04/11/2016 1007   PROT 7.5 04/11/2016 1007   ALBUMIN 2.2 (L) 08/10/2020 0452   ALBUMIN 3.0 (L) 04/11/2016 1007   AST 18 08/10/2020 0452   AST 12 04/11/2016 1007   ALT 14 08/10/2020 0452   ALT 12 04/11/2016 1007   ALKPHOS 51 08/10/2020 0452   ALKPHOS 82 04/11/2016 1007   BILITOT 0.4 08/10/2020 0452   BILITOT 0.35 04/11/2016 1007   GFRNONAA 31 (L) 08/10/2020 0452   GFRAA 30 (L) 02/19/2020 1359   Lipase     Component Value Date/Time   LIPASE 35 08/08/2020 1756       Studies/Results: DG Abdomen 1 View  Result Date: 08/08/2020 CLINICAL DATA:  Abdominal hernia concerns for strangulation. EXAM: ABDOMEN - 1 VIEW COMPARISON:  CT abdomen pelvis February 08, 2020 and abdominal radiograph August 12, 2018. FINDINGS: The bowel gas pattern is normal with gas visualized level of colon. Thoracolumbar spondylosis. IMPRESSION:  Nonobstructive bowel gas pattern. Electronically Signed   By: Dahlia Bailiff MD   On: 08/08/2020 18:20   CT ABDOMEN PELVIS W CONTRAST  Result Date: 08/09/2020 CLINICAL DATA:  Persistent abdominal pain from known hernia x3 weeks. EXAM: CT ABDOMEN AND PELVIS WITH CONTRAST TECHNIQUE: Multidetector CT imaging of the abdomen and pelvis was performed using the standard protocol following bolus administration of intravenous contrast. CONTRAST:  42m OMNIPAQUE IOHEXOL 300 MG/ML  SOLN COMPARISON:  February 08, 2020 FINDINGS: Lower chest: There is atelectasis at the lung bases. There is a fat containing [place]k hernia on the right.The heart size is normal. Hepatobiliary: The liver is normal. Normal gallbladder.There is no biliary ductal dilation. Pancreas: Normal contours without ductal dilatation.  No peripancreatic fluid collection. Spleen: There is splenomegaly with the spleen measuring approximately 13 cm craniocaudad. Adrenals/Urinary Tract: --Adrenal glands: Unremarkable. --Right kidney/ureter: There is a solid exophytic mass arising from the interpolar region of the right kidney measuring 3.5 cm (axial series 2, image 30). Multiple cysts are noted throughout the right kidney. There is no right-sided hydronephrosis. --Left kidney/ureter: Multiple cysts are noted. --Urinary bladder: Unremarkable. Stomach/Bowel: --Stomach/Duodenum: No hiatal hernia or other gastric abnormality. Normal duodenal course and caliber. --Small bowel: Unremarkable. --Colon: There is a moderate amount of stool at the level of the rectum. There is scattered colonic diverticula without CT evidence for diverticulitis. Again noted is a large ventral wall hernia containing a loop of the transverse colon in addition to portions of the patient's appendix. There is now a large air and fluid collection in the hernia sac measuring approximately 9.4 x 7.2 cm. There is significant adjacent fat stranding and overlying skin thickening. --Appendix: The visualized portions of the intraperitoneal appendix are unremarkable. The portions that are herniated are difficult to evaluate. Vascular/Lymphatic: Atherosclerotic calcification is present within the non-aneurysmal abdominal aorta, without hemodynamically significant stenosis. --there are few prominent, nonspecific retroperitoneal lymph nodes. --No mesenteric lymphadenopathy. --No pelvic or inguinal lymphadenopathy. Reproductive: Unremarkable Other: Again noted is a large ventral wall hernia containing portions of the transverse colon as and a large volume of peritoneal fat. Musculoskeletal. There is diffuse osteopenia which limits detection of nondisplaced fractures. There is no definite acute displaced fracture identified on today's study. IMPRESSION: 1. Large ventral wall hernia containing a  loop of the transverse colon. While there is no evidence for an obstruction, there is a new large air and fluid collection within the hernia sac concerning for perforation of the transverse colon with resultant abscess formation or complicated diverticulitis. Alternatively, a portion of the appendix is located within the hernia sac. As such, the differential includes complicated appendicitis, although this is felt to be less likely. 2. There is a 3.5 cm right renal mass consistent with renal cell carcinoma until proven otherwise. 3. Splenomegaly. 4. Additional chronic findings as detailed above. Aortic Atherosclerosis (ICD10-I70.0). These results were called by telephone at the time of interpretation on 08/09/2020 at 2:57 am to provider DAN FLOYD , who verbally acknowledged these results. Electronically Signed   By: CConstance HolsterM.D.   On: 08/09/2020 03:00   DG CHEST PORT 1 VIEW  Result Date: 08/09/2020 CLINICAL DATA:  80year old female with history of COVID infection with increasing shortness of breath. EXAM: PORTABLE CHEST 1 VIEW COMPARISON:  Chest x-ray 01/01/2018. FINDINGS: Ill-defined opacities and areas of mild interstitial prominence are noted throughout the mid to lower lungs bilaterally. Small left pleural effusion or chronic pleuroparenchymal scarring, stable compared to remote prior study from 2019. No pneumothorax. No  evidence of pulmonary edema. Heart size is mildly enlarged. Upper mediastinal contours are within normal limits. Aortic atherosclerosis. IMPRESSION: 1. There are subtle findings in the mid to lower lungs which suggest developing multilobar bilateral pneumonia, compatible with reported COVID infection. 2. Mild cardiomegaly. 3. Aortic atherosclerosis. Electronically Signed   By: Vinnie Langton M.D.   On: 08/09/2020 09:14   [place] IMAGE GUIDED FLUID DRAIN BY CATHETER  Result Date: 08/09/2020 INDICATION: 80 year old female with a history incarcerated ventral hernia containing colon  and appendix, now with abscess, presumably from appendicitis EXAM: ULTRASOUND-GUIDED DRAINAGE OF ABDOMINAL WALL ABSCESS MEDICATIONS: The patient is currently admitted to the hospital and receiving intravenous antibiotics. The antibiotics were administered within an appropriate time frame prior to the initiation of the procedure. ANESTHESIA/SEDATION: Fentanyl 100 mcg IV; Versed 1.0 mg IV Moderate Sedation Time:  12 minutes The patient was continuously monitored during the procedure by the interventional radiology nurse under my direct supervision. COMPLICATIONS: None PROCEDURE: Informed written consent was obtained from the patient after a thorough discussion of the procedural risks, benefits and alternatives. All questions were addressed. Maximal Sterile Barrier Technique was utilized including caps, mask, sterile gowns, sterile gloves, sterile drape, hand hygiene and skin antiseptic. A timeout was performed prior to the initiation of the procedure. Patient positioned supine position on the ultrasound gantry. Scout images were acquired for planning purposes. The patient is prepped and draped in the usual sterile fashion. 1% lidocaine was used for local anesthesia. Once the patient was anesthetized, small stab incision was made with 11 blade scalpel. Trocar technique was used to place a 12 [place] drain into the abscess with complex fluid in the ventral wall. The 12 French catheter was advanced from the trocar and formed in the abscess. Approximately 150 cc of foul-smelling dark fluid aspirated. Culture was sent. Catheter was sutured in position and a final image was stored. Catheter was attached to bulb suction. Patient tolerated the procedure well and remained hemodynamically stable throughout. No complications were encountered and no significant blood loss. IMPRESSION: Status post ultrasound-guided drainage of ventral wall abscess with sample sent for culture. Signed, Dulcy Fanny. Dellia Nims, RPVI [facility] Electronically Signed   By: Corrie Mckusick D.O.   On: 08/09/2020 16:27    Anti-infectives: Anti-infectives (From admission, onward)   Start     Dose/Rate Route Frequency Ordered Stop   08/10/20 1000  remdesivir 100 mg in sodium chloride 0.9 % 100 mL IVPB       "Followed by" Linked Group Details   100 mg 200 mL/hr over 30 Minutes Intravenous Daily 08/09/20 1045 08/14/20 0959   08/09/20 1200  remdesivir 200 mg in sodium chloride 0.9% 250 mL IVPB       "Followed by" Linked Group Details   200 mg 580 mL/hr over 30 Minutes Intravenous Once 08/09/20 1045 08/09/20 1430   08/09/20 0745  piperacillin-tazobactam (ZOSYN) IVPB 3.375 g        3.375 g 12.5 mL/hr over 240 Minutes Intravenous Every 8 hours 08/09/20 0735     08/09/20 0315  piperacillin-tazobactam (ZOSYN) IVPB 3.375 g        3.375 g 100 mL/hr over 30 Minutes Intravenous  Once 08/09/20 0306 08/09/20 0515       Assessment/Plan H/O Ulcerative colitis  HTN A fib- not on anticoagulation Cor pulmonale Chronic combined CHF - last ECHO in Oct 2021 with EF 60-65% PVD COPD/O2 dependence HLD CKDstage III MGUS OSA Obesity- BMI 35.74 Gout  COVID positive - per primary, patient has received 2 doses of vaccine but has not had booster yet. Having some sob on top of baseline she reports.  3.5 cm R renal mass - concern for renal cell carcinoma. Urology, Dr. Tresa Moore, has seen and suspects likely non-metastatic slowly growing renal cancer. Believes this is very unlikely to become clinically significant at her level of comorbidity. Additionally, curative therapy would have high probability of pushing her to ESRD and she adamantly refuses dialysis.  He recommend watchful waiting with consideration of systemic palliative therapy should symptomatic / metastatic disease ever develop, which he believes is very unlikely. He does NOT recommend any concomitant kidney surgery should she have to  have heroic intra-abdominal surgery for GI/hernia indications.   Chronically incarcerated ventral hernia  Fluid collection in hernia sac - CT 1/11 with large ventral hernia containing transverse colon, similar to previous with new air/fluid collection within there hernia - s/p IR drainage 1/11. Cx's pending.  - IR drain appears to be with purulent brown fluid and I am concerned his stool.  I am concerned that patient has underlying perforation in hernia that cause fluid collection to form.  This could be from perforated appendicitis, diverticulitis or another etiology.  I am also concerned about impending skin breakdown over inferior hernia sac.  I recommended that we take the patient to the operating room for exploratory laparotomy and likely drainage of fluid collection, colectomy, creation of a colostomy and primary repair of hernia.  We discussed without operative intervention I am concerned that she will worsen and this could lead to escalation of care and possible death. Patient is a high risk surgical candidate and has been seen by [facility] and Cardiology yesterday for risk stratification. We discussed that cardiology reports a 6.6% risk of major cardiac event.  We discussed that pulmonology reports she is very high risk for prolonged mechanical ventilation after surgery and has a 10% risk of postop pneumonia.  Patient is unsure if she would like to proceed with surgery.  She states she understands without surgery, this could lead to her death.  She would like to discuss with family.  I offered to reach out to family for her, however she wishes to speak with them herself.  She states that her husband has underlying dementia.  I have also spoken with the primary team in regards to this he will see her shortly.  If patient does not wish to proceed with surgery, may need goals of care consult from palliative. We will continue to follow closely.  FEN: Strict NPO, IVF per primary  VTE: SCDs, SQ heparin   ID: Zosyn 1/11>> Cx's pending. IR cx w/ Gram + cocci and rods, gram neg rods and gram variable rods.    LOS: 1 day    Jillyn Ledger , [facility] 08/10/2020, 9:31 AM Please see Amion for pager number during day hours 7:00am-4:30pm

## 2020-08-10 NOTE — Progress Notes (Signed)
PROGRESS NOTE  KAMYRAH FEESER  GGE:[id number] DOB: Dec 31, 1940 DOA: 08/09/2020 PCP: [name], MD   Brief Narrative: DELANE STALLING is an 80 y.o. female with a history of 2L O2-dependent COPD, stage IIIbCKD, chronic HFpEF, morbid obesity who presented to the ED with 3 weeks of worsening abdominal pain and enlargement of chronic ventral hernia found to have hernia incarceration with evidence of perforation (diverticular vs. appendix) as well as covid-19 pneumonia. IR performed aspiration and drain placement 1/11 which returned feculent material. General surgery is recommending operative management as this is the only effective treatment. Cardiology and pulmonary have been consulted for opinions and recommendations regarding operative risk, of course stating that the patient is at very high risk for perioperative complications including protracted course of intubation and death, though no further testing or optimization was recommended. Palliative discussions are ongoing and palliative care is formally consulted. Zosyn, remdesivir, and solumedrol were started.  Assessment & Plan: Principal Problem:   Irreducible ventral hernia Active Problems:   Morbid obesity (HCC)   HTN (hypertension)   Chronic kidney disease, stage 3b (HCC)   CHF (congestive heart failure), NYHA class III, chronic, diastolic (HCC)   Infection as cause of abscess of colon   COPD (chronic obstructive pulmonary disease) ([place])  Incarcerated ventral hernia: With suspected colonic/appendiceal perforation s/p tube placement by IR 1/11.  - Continue zosyn.  - Definitive therapy per surgery is operative, not medical. This is considered in light of significant perioperative risk. Pt wishes to discuss with family. To assist, palliative care is consulted. I have offered a view into what a comfort-based approach in lieu of surgery would look like.   Covid-19 pneumonia: Breakthrough case (s/p Pfizer 5/22, 01/09/2020) with + PCR 1/11  and subtle infiltrates on CXR.  - Continue remdesivir (started 1/11), with no hypoxia above her baseline and concomitant wound/perforation, will DC solumedrol. Certainly not a candidate for immunomodulators and currently does not require consideration.  Chronic hypoxic respiratory failure due to COPD: Without exacerbation.  - Continue bronchodilators.  - Of course, increases risk of postoperative failure to wean from vent. Pulmonary has evaluated the patient.  - Aggressive pulmonary toilet  Chronic HFpEF: G2DD, preserved LVEF.  - Continue metoprolol, bidil.  - Gentle IVF have been ordered while pt is NPO in preparation for surgery. Holding torsemide currently.  - No further recommendations for optimization or risk stratification per cardiology. 6.6% risk of MACE.   Stage IIIb CKD: SCr near baseline.  - Avoid nephrotoxins.   Left renal mass:  - Urology recommending addressing at a later time.   HLD:  - Pravastatin  Obesity: Estimated body mass index is 37.07 kg/m as calculated from the following:   Height as of this encounter: 5' (1.524 m).   Weight as of this encounter: 86.1 kg.  Gout: No flare - Continue allopurinol  DVT prophylaxis: [place] heparin Code Status: Full Family Communication: None at bedside. Pt wishes to call family herself. Disposition Plan:  Status is: Inpatient  Remains inpatient appropriate because:Ongoing diagnostic testing needed not appropriate for outpatient work up and Inpatient level of care appropriate due to severity of illness  Dispo: The patient is from: Home              Anticipated d/c is to: TBD              Anticipated d/c date is: > 3 days              Patient currently is not  medically stable to d/c.  Consultants:   IR  Surgery  Cardiology  Pulmonary/CCM  Palliative care  Procedures:   Anterior abdominal wall drain placement 38F pigtail drain 08/09/2020 Dr. Earleen Newport  Antimicrobials:  Zosyn  Remdesivir   Subjective: Pain is  controlled in the anterior lower abdomen when nothing is moving or touching it, but otherwise in severe intermittent pain with any palpation. No nausea or vomiting. Denies dyspnea, chest pain or orthopnea currently or recently.   Objective: Vitals:   08/10/20 0300 08/10/20 0642 08/10/20 0818 08/10/20 1157  BP: (!) 160/88 (!) 155/84 (!) 163/48 126/67  Pulse: 66 (!) 52 (!) 55 (!) 53  Resp: 14 16 17 18   Temp: 98 F (36.7 C) 98.9 F (37.2 C) 97.7 F (36.5 C) 97.7 F (36.5 C)  TempSrc: Axillary Oral Oral Oral  SpO2: 95% 100% 97% 100%  Weight:      Height:        Intake/Output Summary (Last 24 hours) at 08/10/2020 1453 Last data filed at 08/10/2020 0451 Gross per 24 hour  Intake 543.54 ml  Output 100 ml  Net 443.54 ml   Filed Weights   08/08/20 1413 08/09/20 2329  Weight: 83 kg 86.1 kg   Gen: Elderly obese female in no acute distress Pulm: Non-labored breathing 2L O2, diminished without wheezes or crackles.  CV: Regular rate and rhythm. No murmur, rub, or gallop. No definite JVD, no pitting pedal edema. GI: Abdomen exceptionally tender with erythematous ventral wall with serous blister formation and drain intact. Pain limits exam. Ext: Warm, no deformities Skin: As above, otherwise no rashes, lesions no ulcers Neuro: Alert and oriented. No focal neurological deficits. Psych: Judgement and insight appear normal. Mood & affect appropriate.   Data Reviewed: I have personally reviewed following labs and imaging studies  CBC: Recent Labs  Lab 08/08/20 1756 08/09/20 0735 08/10/20 0452  WBC 24.6* 20.6* 19.0*  HGB 10.4* 8.6* 8.8*  HCT 32.2* 27.3* 28.3*  MCV 78.7* 78.7* 79.9*  PLT 340 291 409   Basic Metabolic Panel: Recent Labs  Lab 08/08/20 1756 08/09/20 0735 08/10/20 0452  NA 132* 133* 136  K 3.4* 3.5 4.1  CL 92* 96* 97*  CO2 25 27 27   GLUCOSE 110* 104* 125*  BUN 35* 33* 40*  CREATININE 1.39* 1.45*  1.44* 1.65*  CALCIUM 9.5 8.6* 9.0   GFR: Estimated Creatinine  Clearance: 26.5 mL/min (A) (by C-G formula based on SCr of 1.65 mg/dL (H)). Liver Function Tests: Recent Labs  Lab 08/08/20 1756 08/10/20 0452  AST 24 18  ALT 15 14  ALKPHOS 66 51  BILITOT 0.7 0.4  PROT 7.6 6.0*  ALBUMIN 3.2* 2.2*   Recent Labs  Lab 08/08/20 1756  LIPASE 35   No results for input(s): AMMONIA in the last 168 hours. Coagulation Profile: Recent Labs  Lab 08/09/20 0957  INR 1.2   Cardiac Enzymes: No results for input(s): CKTOTAL, CKMB, CKMBINDEX, TROPONINI in the last 168 hours. BNP (last 3 results) No results for input(s): PROBNP in the last 8760 hours. HbA1C: No results for input(s): HGBA1C in the last 72 hours. CBG: No results for input(s): GLUCAP in the last 168 hours. Lipid Profile: No results for input(s): CHOL, HDL, LDLCALC, TRIG, CHOLHDL, LDLDIRECT in the last 72 hours. Thyroid Function Tests: No results for input(s): TSH, T4TOTAL, FREET4, T3FREE, THYROIDAB in the last 72 hours. Anemia Panel: Recent Labs    08/09/20 0957 08/10/20 0452  FERRITIN 166 198   Urine analysis:  Component Value Date/Time   COLORURINE YELLOW 08/09/2020 0700   APPEARANCEUR HAZY (A) 08/09/2020 0700   LABSPEC 1.034 (H) 08/09/2020 0700   PHURINE 5.0 08/09/2020 0700   GLUCOSEU NEGATIVE 08/09/2020 0700   GLUCOSEU NEGATIVE 11/05/2017 1103   HGBUR NEGATIVE 08/09/2020 0700   HGBUR moderate 02/02/2008 1439   BILIRUBINUR NEGATIVE 08/09/2020 0700   KETONESUR NEGATIVE 08/09/2020 0700   PROTEINUR 30 (A) 08/09/2020 0700   UROBILINOGEN 0.2 11/05/2017 1103   NITRITE NEGATIVE 08/09/2020 0700   LEUKOCYTESUR NEGATIVE 08/09/2020 0700   Recent Results (from the past 240 hour(s))  Resp Panel by RT-PCR (Flu A&B, Covid) Nasopharyngeal Swab     Status: Abnormal   Collection Time: 08/09/20  4:41 AM   Specimen: Nasopharyngeal Swab; Nasopharyngeal(NP) swabs in vial transport medium  Result Value Ref Range Status   SARS Coronavirus 2 by RT PCR POSITIVE (A) NEGATIVE Final     Comment: RESULT CALLED TO, READ BACK BY AND VERIFIED WITH: GARRISON,G. RN AT 2353 08/09/20 MULLINS,T (NOTE) SARS-CoV-2 target nucleic acids are DETECTED.  The SARS-CoV-2 RNA is generally detectable in upper respiratory specimens during the acute phase of infection. Positive results are indicative of the presence of the identified virus, but do not rule out bacterial infection or co-infection with other pathogens not detected by the test. Clinical correlation with patient history and other diagnostic information is necessary to determine patient infection status. The expected result is Negative.  Fact Sheet for Patients: EntrepreneurPulse.com.au  Fact Sheet for Healthcare Providers: IncredibleEmployment.be  This test is not yet approved or cleared by the [place] FDA and  has been authorized for detection and/or diagnosis of SARS-CoV-2 by FDA under an Emergency Use Authorization (EUA).  This EUA will remain in effect (meaning this tes t can be used) for the duration of  the COVID-19 declaration under Section 564(b)(1) of the Act, 21 U.S.C. section 360bbb-3(b)(1), unless the authorization is terminated or revoked sooner.     Influenza A by PCR NEGATIVE NEGATIVE Final   Influenza B by PCR NEGATIVE NEGATIVE Final    Comment: (NOTE) The Xpert Xpress SARS-CoV-2/FLU/RSV plus assay is intended as an aid in the diagnosis of influenza from Nasopharyngeal swab specimens and should not be used as a sole basis for treatment. Nasal washings and aspirates are unacceptable for Xpert Xpress SARS-CoV-2/FLU/RSV testing.  Fact Sheet for Patients: EntrepreneurPulse.com.au  Fact Sheet for Healthcare Providers: IncredibleEmployment.be  This test is not yet approved or cleared by the [place] FDA and has been authorized for detection and/or diagnosis of SARS-CoV-2 by FDA under an Emergency Use Authorization (EUA).  This EUA will remain in effect (meaning this test can be used) for the duration of the COVID-19 declaration under Section 564(b)(1) of the Act, 21 U.S.C. section 360bbb-3(b)(1), unless the authorization is terminated or revoked.  Performed at [facility], [place] [address], [place], [place] [address]   Aerobic/Anaerobic Culture (surgical/deep wound)     Status: None (Preliminary result)   Collection Time: 08/09/20  3:51 PM   Specimen: Abscess  Result Value Ref Range Status   Specimen Description   Final    ABSCESS Performed at [facility] [address], [place], [place] [address]    Special Requests   Final    NONE Performed at [facility], [place] [address], [place], [place] [address]    Gram Stain   Final    NO WBC SEEN ABUNDANT GRAM POSITIVE COCCI ABUNDANT GRAM NEGATIVE RODS ABUNDANT GRAM POSITIVE  RODS ABUNDANT GRAM VARIABLE ROD Performed at [facility], [place] [address], [place], [place] [address]    Culture PENDING  Incomplete   Report Status PENDING  Incomplete      Radiology Studies: DG Abdomen 1 View  Result Date: 08/08/2020 CLINICAL DATA:  Abdominal hernia concerns for strangulation. EXAM: ABDOMEN - 1 VIEW COMPARISON:  CT abdomen pelvis February 08, 2020 and abdominal radiograph August 12, 2018. FINDINGS: The bowel gas pattern is normal with gas visualized level of colon. Thoracolumbar spondylosis. IMPRESSION: Nonobstructive bowel gas pattern. Electronically Signed   By: Dahlia Bailiff MD   On: 08/08/2020 18:20   CT ABDOMEN PELVIS W CONTRAST  Result Date: 08/09/2020 CLINICAL DATA:  Persistent abdominal pain from known hernia x3 weeks. EXAM: CT ABDOMEN AND PELVIS WITH CONTRAST TECHNIQUE: Multidetector CT imaging of the abdomen and pelvis was performed using the standard protocol following bolus administration of intravenous contrast. CONTRAST:  31m OMNIPAQUE IOHEXOL 300 MG/ML  SOLN COMPARISON:  February 08, 2020  FINDINGS: Lower chest: There is atelectasis at the lung bases. There is a fat containing [place]k hernia on the right.The heart size is normal. Hepatobiliary: The liver is normal. Normal gallbladder.There is no biliary ductal dilation. Pancreas: Normal contours without ductal dilatation. No peripancreatic fluid collection. Spleen: There is splenomegaly with the spleen measuring approximately 13 cm craniocaudad. Adrenals/Urinary Tract: --Adrenal glands: Unremarkable. --Right kidney/ureter: There is a solid exophytic mass arising from the interpolar region of the right kidney measuring 3.5 cm (axial series 2, image 30). Multiple cysts are noted throughout the right kidney. There is no right-sided hydronephrosis. --Left kidney/ureter: Multiple cysts are noted. --Urinary bladder: Unremarkable. Stomach/Bowel: --Stomach/Duodenum: No hiatal hernia or other gastric abnormality. Normal duodenal course and caliber. --Small bowel: Unremarkable. --Colon: There is a moderate amount of stool at the level of the rectum. There is scattered colonic diverticula without CT evidence for diverticulitis. Again noted is a large ventral wall hernia containing a loop of the transverse colon in addition to portions of the patient's appendix. There is now a large air and fluid collection in the hernia sac measuring approximately 9.4 x 7.2 cm. There is significant adjacent fat stranding and overlying skin thickening. --Appendix: The visualized portions of the intraperitoneal appendix are unremarkable. The portions that are herniated are difficult to evaluate. Vascular/Lymphatic: Atherosclerotic calcification is present within the non-aneurysmal abdominal aorta, without hemodynamically significant stenosis. --there are few prominent, nonspecific retroperitoneal lymph nodes. --No mesenteric lymphadenopathy. --No pelvic or inguinal lymphadenopathy. Reproductive: Unremarkable Other: Again noted is a large ventral wall hernia containing portions  of the transverse colon as and a large volume of peritoneal fat. Musculoskeletal. There is diffuse osteopenia which limits detection of nondisplaced fractures. There is no definite acute displaced fracture identified on today's study. IMPRESSION: 1. Large ventral wall hernia containing a loop of the transverse colon. While there is no evidence for an obstruction, there is a new large air and fluid collection within the hernia sac concerning for perforation of the transverse colon with resultant abscess formation or complicated diverticulitis. Alternatively, a portion of the appendix is located within the hernia sac. As such, the differential includes complicated appendicitis, although this is felt to be less likely. 2. There is a 3.5 cm right renal mass consistent with renal cell carcinoma until proven otherwise. 3. Splenomegaly. 4. Additional chronic findings as detailed above. Aortic Atherosclerosis (ICD10-I70.0). These results were called by telephone at the time of interpretation on 08/09/2020 at 2:57 am to provider DAN FLOYD , who verbally  acknowledged these results. Electronically Signed   By: Constance Holster M.D.   On: 08/09/2020 03:00   DG CHEST PORT 1 VIEW  Result Date: 08/09/2020 CLINICAL DATA:  80 year old female with history of COVID infection with increasing shortness of breath. EXAM: PORTABLE CHEST 1 VIEW COMPARISON:  Chest x-ray 01/01/2018. FINDINGS: Ill-defined opacities and areas of mild interstitial prominence are noted throughout the mid to lower lungs bilaterally. Small left pleural effusion or chronic pleuroparenchymal scarring, stable compared to remote prior study from 2019. No pneumothorax. No evidence of pulmonary edema. Heart size is mildly enlarged. Upper mediastinal contours are within normal limits. Aortic atherosclerosis. IMPRESSION: 1. There are subtle findings in the mid to lower lungs which suggest developing multilobar bilateral pneumonia, compatible with reported COVID  infection. 2. Mild cardiomegaly. 3. Aortic atherosclerosis. Electronically Signed   By: Vinnie Langton M.D.   On: 08/09/2020 09:14   [place] IMAGE GUIDED FLUID DRAIN BY CATHETER  Result Date: 08/09/2020 INDICATION: 80 year old female with a history incarcerated ventral hernia containing colon and appendix, now with abscess, presumably from appendicitis EXAM: ULTRASOUND-GUIDED DRAINAGE OF ABDOMINAL WALL ABSCESS MEDICATIONS: The patient is currently admitted to the hospital and receiving intravenous antibiotics. The antibiotics were administered within an appropriate time frame prior to the initiation of the procedure. ANESTHESIA/SEDATION: Fentanyl 100 mcg IV; Versed 1.0 mg IV Moderate Sedation Time:  12 minutes The patient was continuously monitored during the procedure by the interventional radiology nurse under my direct supervision. COMPLICATIONS: None PROCEDURE: Informed written consent was obtained from the patient after a thorough discussion of the procedural risks, benefits and alternatives. All questions were addressed. Maximal Sterile Barrier Technique was utilized including caps, mask, sterile gowns, sterile gloves, sterile drape, hand hygiene and skin antiseptic. A timeout was performed prior to the initiation of the procedure. Patient positioned supine position on the ultrasound gantry. Scout images were acquired for planning purposes. The patient is prepped and draped in the usual sterile fashion. 1% lidocaine was used for local anesthesia. Once the patient was anesthetized, small stab incision was made with 11 blade scalpel. Trocar technique was used to place a 12 [place] drain into the abscess with complex fluid in the ventral wall. The 12 French catheter was advanced from the trocar and formed in the abscess. Approximately 150 cc of foul-smelling dark fluid aspirated. Culture was sent. Catheter was sutured in position and a final image was stored. Catheter was attached to bulb suction. Patient  tolerated the procedure well and remained hemodynamically stable throughout. No complications were encountered and no significant blood loss. IMPRESSION: Status post ultrasound-guided drainage of ventral wall abscess with sample sent for culture. Signed, Dulcy Fanny. Dellia Nims, RPVI [facility] Electronically Signed   By: Corrie Mckusick D.O.   On: 08/09/2020 16:27    Scheduled Meds: . allopurinol  100 mg Oral Daily  . fluticasone furoate-vilanterol  1 puff Inhalation Daily  . heparin  5,000 Units Subcutaneous Q8H  . isosorbide-hydrALAZINE  1 tablet Oral BID  . methylPREDNISolone (SOLU-MEDROL) injection  40 mg Intravenous Q12H  . metoprolol tartrate  12.5 mg Oral BID  . Pitavastatin Magnesium  2 mg Oral Daily  . sodium chloride flush  5 mL Intracatheter Q8H   Continuous Infusions: . lactated ringers Stopped (08/10/20 1042)  . piperacillin-tazobactam (ZOSYN)  IV 3.375 g (08/10/20 1342)  . remdesivir 100 mg in NS 100 mL 100 mg (08/10/20 1051)     LOS: 1 day   Time spent: 35 minutes.  Patrecia Pour, MD [facility] [email or web address] 08/10/2020, 2:53 PM

## 2020-08-10 NOTE — Plan of Care (Signed)

## 2020-08-10 NOTE — Consult Note (Signed)
Consultation Note Date: 08/10/2020   Patient Name: [name]  DOB: 16-Sep-1940  MRN: [id number]  Age / Sex: 80 y.o., female  PCP: Laurie Lima, MD Referring Physician: Patrecia Pour, MD  Reason for Consultation: Establishing goals of care  HPI/Patient Profile: 80 y.o. female  with past medical history of O2 dependent COPD, stage IIIb CKD, chronic heart failure with preserved ejection fraction, morbid obesity, longstanding ventral hernia admitted on 08/09/2020 with 3 weeks of worsening abdominal pain and enlargement of chronic ventral hernia who was found to have incarceration with evidence of perforation.  IR performed aspiration and drain placement which returned feculent material.  General surgery has evaluated and operative management is really only long-term viable option in regards to effective treatment of underlying condition.  Pulmonary and cardiology are both evaluated her and she is at high risk for cardiac or respiratory complications following surgery.  She has been determined to be incidentally COVID-positive as well.  Palliative care consulted for further clarification of goals of care.  Clinical Assessment and Goals of Care: I met today [name].  I introduced palliative care as specialized medical care for people living with serious illness. It focuses on providing relief from the symptoms and stress of a serious illness. The goal is to improve quality of life for both the patient and the family.  We discussed clinical course as well as wishes moving forward in regard to advanced directives.  Concepts specific to code status and care plan this hospitalization discussed.  We discussed difference between a aggressive medical intervention path and a palliative, comfort focused care path.  Values and goals of care important to patient and family were attempted to be elicited.  Concept of  Hospice and Palliative Care were discussed.  Questions and concerns addressed.   PMT will continue to support holistically.  SUMMARY OF RECOMMENDATIONS   -Full code/full scope -[name] tells me today that her desired limit of care would be that she would not want to be intubated for any prolonged period of time.  We discussed potential outcomes for surgery and concern that she has high risk for adverse cardiac or respiratory events.  She seems to understand this very clearly as she quoted back percentages that were given to her previously (6.6% chance of cardiac event, 10% chance of developing pneumonia and greater than 25% chance that she may not be extubated 48 hours after surgery).  We talked through scenarios and she expressed that if it becomes apparent that she is not going to be weaned from ventilator, she would want to refocus her care on comfort at that point in time.  Up until that point, however, she would like to continue with any and all aggressive interventions including desiring surgical intervention and desiring heroic interventions such as CPR and intubation in the event of cardiac or respiratory arrest. -She expressed today worry about who will care for her husband with dementia and her disabled daughter.  I believe this is weighing heavily on her decisions.  I explained that even with surgery, expected outcome would not be transitioning home from the hospital with the same level of functional status she had prior to this admission. -We discussed surrogate decision making.  Her husband has dementia.  In the event she cannot make her own medical decisions, she has 3 children.  1 of these daughters is disabled.  She would like for the other 2 children (daughter-[name] and [name]) to make decisions on her behalf. -Patient very much leaning toward surgery and tells me today that this is her plan.  She has not spoken with one of her daughters, however, and wants to talk with  her tonight when she finishes work at 7 PM prior to making a "final" decision.  Code Status/Advance Care Planning:  Full code   Symptom Management:   Continue same.  Reports pain currently well controlled.  Palliative Prophylaxis:   Frequent Pain Assessment  Additional Recommendations (Limitations, Scope, Preferences):  Full Scope Treatment  Psycho-social/Spiritual:   Desire for further Chaplaincy support:no  Additional Recommendations: Caregiving  Support/Resources  Prognosis:   Guarded  Discharge Planning: To Be Determined      Primary Diagnoses: Present on Admission: . Morbid obesity ([place]) . Chronic kidney disease, stage 3b ([place]) . HTN (hypertension) . CHF (congestive heart failure), NYHA class III, chronic, diastolic ([place])   I have reviewed the medical record, interviewed the patient and family, and examined the patient. The following aspects are pertinent.  Past Medical History:  Diagnosis Date  . Allergy   . Angiodysplasia of cecum   . Angiodysplasia of duodenum   . Arthritis   . Asteatotic eczema 02/25/2016  . Asthma   . Atrial fibrillation ([place]) 10/17/2017  . Blood transfusion without reported diagnosis   . Cataract   . CHF (congestive heart failure), NYHA class III, chronic, combined ([place]) 08/10/2017  . Colonic polyp 02/16/2008   Tubular adenoma  . COPD (chronic obstructive pulmonary disease) ([place]) 01/27/98  . Cor pulmonale ([place]) 11/16/2014  . GERD 07/30/1992  . Gout   . HTN (hypertension) 07/30/1988  . Hyperlipidemia with target LDL less than 100 11/27/1996       . Kidney stone 1960, 1972, 1991  . MGUS (monoclonal gammopathy of unknown significance) 11/08/2016  . Morbid obesity ([place]) 04/19/2010   She agrees to work on her lifestyle modifications to help her lose weight.   . OSA (obstructive sleep apnea) 06/22/2013  . Osteopenia, senile 02/05/2013   July 2014  -2.1 left femur -1.9 left forearm   . Prediabetes 11/13/2006  . Renal insufficiency   .  Stenosis of cervical spine with myelopathy ([place]) 01/01/2018  . Ulcer of leg, chronic, right ([place]) 10/10/2011  . ULCERATIVE COLITIS-LEFT SIDE 03/19/2008       . Vitamin B12 deficiency anemia 11/13/2006       . Vitamin D deficiency 02/06/2013   Social History   Socioeconomic History  . Marital status: Married    Spouse name: Not on file  . Number of children: 3  . Years of education: Not on file  . Highest education level: Not on file  Occupational History  . Occupation: Surveyor, quantity: [employer]  Tobacco Use  . Smoking status: Former Smoker    Quit date: 06/01/1996    Years since quitting: 24.2  . Smokeless tobacco: Never Used  . Tobacco comment: Quit 1998  Vaping Use  . Vaping Use: Never used  Substance and Sexual Activity  . Alcohol use: No  .  Drug use: No  . Sexual activity: Not Currently  Other Topics Concern  . Not on file  Social History Narrative   Daily Caffeine Use:  4   Regular Exercise -  NO      Social Determinants of Health   Financial Resource Strain: High Risk  . Difficulty of Paying Living Expenses: Hard  Food Insecurity: Not on file  Transportation Needs: Not on file  Physical Activity: Not on file  Stress: Not on file  Social Connections: Not on file   Family History  Problem Relation Age of Onset  . Stroke Mother   . Diabetes Mother   . Kidney cancer Mother   . Stomach cancer Mother   . Heart disease Mother   . Stroke Father   . Heart disease Brother   . Heart disease Brother   . Crohn's disease Brother   . Heart disease Sister        CATH,STENT  . Clotting disorder Sister   . Cervical cancer Sister   . Lung cancer Sister   . Heart attack Sister   . Diabetes Sister   . Colon cancer Neg Hx   . Esophageal cancer Neg Hx   . Rectal cancer Neg Hx    Scheduled Meds: . allopurinol  100 mg Oral Daily  . fluticasone furoate-vilanterol  1 puff Inhalation Daily  . heparin  5,000 Units Subcutaneous Q8H  . isosorbide-hydrALAZINE  1 tablet Oral  BID  . metoprolol tartrate  12.5 mg Oral BID  . Pitavastatin Magnesium  2 mg Oral Daily  . sodium chloride flush  5 mL Intracatheter Q8H   Continuous Infusions: . lactated ringers Stopped (08/10/20 1042)  . piperacillin-tazobactam (ZOSYN)  IV 3.375 g (08/10/20 1342)  . remdesivir 100 mg in NS 100 mL 100 mg (08/10/20 1051)   PRN Meds:.albuterol, HYDROmorphone (DILAUDID) injection, metoprolol tartrate, ondansetron **OR** ondansetron (ZOFRAN) IV Medications Prior to Admission:  Prior to Admission medications   Medication Sig Start Date End Date Taking? Authorizing Provider  acetaminophen (TYLENOL) 325 MG tablet Take 325-650 mg by mouth daily as needed for moderate pain, fever or headache.    Yes [provider]  albuterol (PROAIR HFA) 108 (90 Base) MCG/ACT inhaler Inhale 1-2 puffs into the lungs every 6 (six) hours as needed for wheezing or shortness of breath. 10/30/16  Yes Laurie Lima, MD  allopurinol (ZYLOPRIM) 100 MG tablet Take 1 tablet (100 mg total) by mouth daily. 05/29/20  Yes Laurie Lima, MD  cefdinir (OMNICEF) 300 MG capsule Take 1 capsule (300 mg total) by mouth 2 (two) times daily. 08/03/20  Yes Saguier, Percell Miller, PA-C  ferrous sulfate 325 (65 FE) MG tablet Take 1 tablet (325 mg total) by mouth 2 (two) times daily with a meal. 11/13/19  Yes Laurie Lima, MD  fluticasone furoate-vilanterol (BREO ELLIPTA) 100-25 MCG/INH AEPB INHALE 1 PUFF INTO THE LUNGS DAILY 05/23/20  Yes Laurie Lima, MD  isosorbide-hydrALAZINE (BIDIL) 20-37.5 MG tablet Take 1 tablet by mouth 3 (three) times daily. Patient taking differently: Take 1 tablet by mouth 2 (two) times daily. 05/18/20  Yes Laurie Lima, MD  metoprolol tartrate (LOPRESSOR) 25 MG tablet Take 1 tablet (25 mg total) by mouth 2 (two) times daily. Patient taking differently: Take 12.5 mg by mouth 2 (two) times daily. 05/23/20  Yes Laurie Lima, MD  metroNIDAZOLE (FLAGYL) 500 MG tablet Take 1 tablet (500 mg total) by mouth 3  (three) times daily. 08/03/20  Yes Saguier, Percell Miller,  PA-C  Pitavastatin Magnesium (ZYPITAMAG) 2 MG TABS Take 1 tablet by mouth daily. 06/27/20  Yes Laurie Lima, MD  torsemide (DEMADEX) 20 MG tablet TAKE 1 TABLET(20 MG) BY MOUTH TWICE DAILY Patient taking differently: Take 20 mg by mouth 2 (two) times daily. 07/27/20  Yes Laurie Lima, MD   Allergies  Allergen Reactions  . Amoxicillin Diarrhea    DID THE REACTION INVOLVE: Swelling of the face/tongue/throat, SOB, or low BP? N Sudden or severe rash/hives, skin peeling, or the inside of the mouth or nose? N Did it require medical treatment? N When did it last happen?MORE THAN 10 YEARS If all above answers are "NO", may proceed with cephalosporin use.   . Celebrex [Celecoxib]     edema  . Amlodipine Besylate Other (See Comments)    REACTION: edema  . Enalapril Cough  . Lipitor [Atorvastatin]     Muscle aches  . Codeine Sulfate Nausea Only  . Hydrocodone-Acetaminophen Nausea Only   Review of Systems  Constitutional: Positive for activity change, appetite change and fatigue.  Gastrointestinal: Positive for abdominal distention, abdominal pain and constipation.  Psychiatric/Behavioral: Negative for sleep disturbance.   Physical Exam Limited exam secondary to COVID diagnosis General: Alert, awake, in no acute distress.  HEENT: No bruits, no goiter, no JVD Heart: Regular rate and rhythm. No murmur appreciated. Lungs: Good air movement, clear Abdomen: Tender.  Not fully examined.  Ext: No significant edema Skin: Warm and dry Neuro: Grossly intact, nonfocal.   Vital Signs: BP 126/67 (BP Location: Right Arm)   Pulse (!) 53   Temp 97.7 F (36.5 C) (Oral)   Resp 18   Ht 5' (1.524 m)   Wt 86.1 kg   SpO2 100%   BMI 37.07 kg/m  Pain Scale: 0-10   Pain Score: 4    SpO2: SpO2: 100 % O2 Device:SpO2: 100 % O2 Flow Rate: .O2 Flow Rate (L/min): 2 L/min  IO: Intake/output summary:   Intake/Output Summary (Last 24  hours) at 08/10/2020 2108 Last data filed at 08/10/2020 1500 Gross per 24 hour  Intake 500 ml  Output 100 ml  Net 400 ml    LBM: Last BM Date: 08/04/20 Baseline Weight: Weight: 83 kg Most recent weight: Weight: 86.1 kg     Palliative Assessment/Data:     Time In: 1745 Time Out: 1900 Time Total: 75 Greater than 50%  of this time was spent counseling and coordinating care related to the above assessment and plan.  Signed by: Micheline Rough, MD   Please contact Palliative Medicine Team phone at [phone] for questions and concerns.  For individual provider: See Shea Evans

## 2020-08-10 NOTE — Progress Notes (Signed)
Referring Physician(s): Blackman,D  Supervising Physician: Sandi Mariscal  Patient Status:  [facility] - In-pt  Chief Complaint: Abdominal pain/abscess   Subjective: Pt cont to have sig abd discomfort at hernia region; occ nausea; resp status stable at present   Allergies: Amoxicillin, Celebrex [celecoxib], Amlodipine besylate, Enalapril, Lipitor [atorvastatin], Codeine sulfate, and Hydrocodone-acetaminophen  Medications: Prior to Admission medications   Medication Sig Start Date End Date Taking? Authorizing Provider  acetaminophen (TYLENOL) 325 MG tablet Take 325-650 mg by mouth daily as needed for moderate pain, fever or headache.    Yes [provider]  albuterol (PROAIR HFA) 108 (90 Base) MCG/ACT inhaler Inhale 1-2 puffs into the lungs every 6 (six) hours as needed for wheezing or shortness of breath. 10/30/16  Yes [name], MD  allopurinol (ZYLOPRIM) 100 MG tablet Take 1 tablet (100 mg total) by mouth daily. 05/29/20  Yes [name], MD  cefdinir (OMNICEF) 300 MG capsule Take 1 capsule (300 mg total) by mouth 2 (two) times daily. 08/03/20  Yes Saguier, Percell Miller, PA-C  ferrous sulfate 325 (65 FE) MG tablet Take 1 tablet (325 mg total) by mouth 2 (two) times daily with a meal. 11/13/19  Yes [name], MD  fluticasone furoate-vilanterol (BREO ELLIPTA) 100-25 MCG/INH AEPB INHALE 1 PUFF INTO THE LUNGS DAILY 05/23/20  Yes [name], MD  isosorbide-hydrALAZINE (BIDIL) 20-37.5 MG tablet Take 1 tablet by mouth 3 (three) times daily. Patient taking differently: Take 1 tablet by mouth 2 (two) times daily. 05/18/20  Yes [name], MD  metoprolol tartrate (LOPRESSOR) 25 MG tablet Take 1 tablet (25 mg total) by mouth 2 (two) times daily. Patient taking differently: Take 12.5 mg by mouth 2 (two) times daily. 05/23/20  Yes [name], MD  metroNIDAZOLE (FLAGYL) 500 MG tablet Take 1 tablet (500 mg total) by mouth 3 (three) times daily. 08/03/20  Yes Saguier, Percell Miller,  PA-C  Pitavastatin Magnesium (ZYPITAMAG) 2 MG TABS Take 1 tablet by mouth daily. 06/27/20  Yes [name], MD  torsemide (DEMADEX) 20 MG tablet TAKE 1 TABLET(20 MG) BY MOUTH TWICE DAILY Patient taking differently: Take 20 mg by mouth 2 (two) times daily. 07/27/20  Yes [name], MD     Vital Signs: BP (!) 163/48 (BP Location: Right Arm)   Pulse (!) 55   Temp 97.7 F (36.5 C) (Oral)   Resp 17   Ht 5' (1.524 m)   Wt 189 lb 13.1 oz (86.1 kg)   SpO2 97%   BMI 37.07 kg/m   Physical Exam awake/alert; abd drain intact, dressing dry, site mod tender, OP 100 cc light brown? feculent fluid  Imaging: DG Abdomen 1 View  Result Date: 08/08/2020 CLINICAL DATA:  Abdominal hernia concerns for strangulation. EXAM: ABDOMEN - 1 VIEW COMPARISON:  CT abdomen pelvis February 08, 2020 and abdominal radiograph August 12, 2018. FINDINGS: The bowel gas pattern is normal with gas visualized level of colon. Thoracolumbar spondylosis. IMPRESSION: Nonobstructive bowel gas pattern. Electronically Signed   By: Dahlia Bailiff MD   On: 08/08/2020 18:20   CT ABDOMEN PELVIS W CONTRAST  Result Date: 08/09/2020 CLINICAL DATA:  Persistent abdominal pain from known hernia x3 weeks. EXAM: CT ABDOMEN AND PELVIS WITH CONTRAST TECHNIQUE: Multidetector CT imaging of the abdomen and pelvis was performed using the standard protocol following bolus administration of intravenous contrast. CONTRAST:  56m OMNIPAQUE IOHEXOL 300 MG/ML  SOLN COMPARISON:  February 08, 2020 FINDINGS: Lower chest: There is atelectasis at the  lung bases. There is a fat containing [place]ch[place]k hernia on the right.The heart size is normal. Hepatobiliary: The liver is normal. Normal gallbladder.There is no biliary ductal dilation. Pancreas: Normal contours without ductal dilatation. No peripancreatic fluid collection. Spleen: There is splenomegaly with the spleen measuring approximately 13 cm craniocaudad. Adrenals/Urinary Tract: --Adrenal glands: Unremarkable.  --Right kidney/ureter: There is a solid exophytic mass arising from the interpolar region of the right kidney measuring 3.5 cm (axial series 2, image 30). Multiple cysts are noted throughout the right kidney. There is no right-sided hydronephrosis. --Left kidney/ureter: Multiple cysts are noted. --Urinary bladder: Unremarkable. Stomach/Bowel: --Stomach/Duodenum: No hiatal hernia or other gastric abnormality. Normal duodenal course and caliber. --Small bowel: Unremarkable. --Colon: There is a moderate amount of stool at the level of the rectum. There is scattered colonic diverticula without CT evidence for diverticulitis. Again noted is a large ventral wall hernia containing a loop of the transverse colon in addition to portions of the patient's appendix. There is now a large air and fluid collection in the hernia sac measuring approximately 9.4 x 7.2 cm. There is significant adjacent fat stranding and overlying skin thickening. --Appendix: The visualized portions of the intraperitoneal appendix are unremarkable. The portions that are herniated are difficult to evaluate. Vascular/Lymphatic: Atherosclerotic calcification is present within the non-aneurysmal abdominal aorta, without hemodynamically significant stenosis. --there are few prominent, nonspecific retroperitoneal lymph nodes. --No mesenteric lymphadenopathy. --No pelvic or inguinal lymphadenopathy. Reproductive: Unremarkable Other: Again noted is a large ventral wall hernia containing portions of the transverse colon as and a large volume of peritoneal fat. Musculoskeletal. There is diffuse osteopenia which limits detection of nondisplaced fractures. There is no definite acute displaced fracture identified on today's study. IMPRESSION: 1. Large ventral wall hernia containing a loop of the transverse colon. While there is no evidence for an obstruction, there is a new large air and fluid collection within the hernia sac concerning for perforation of the  transverse colon with resultant abscess formation or complicated diverticulitis. Alternatively, a portion of the appendix is located within the hernia sac. As such, the differential includes complicated appendicitis, although this is felt to be less likely. 2. There is a 3.5 cm right renal mass consistent with renal cell carcinoma until proven otherwise. 3. Splenomegaly. 4. Additional chronic findings as detailed above. Aortic Atherosclerosis (ICD10-I70.0). These results were called by telephone at the time of interpretation on 08/09/2020 at 2:57 am to provider DAN FLOYD , who verbally acknowledged these results. Electronically Signed   By: Constance Holster M.D.   On: 08/09/2020 03:00   DG CHEST PORT 1 VIEW  Result Date: 08/09/2020 CLINICAL DATA:  80 year old female with history of COVID infection with increasing shortness of breath. EXAM: PORTABLE CHEST 1 VIEW COMPARISON:  Chest x-ray 01/01/2018. FINDINGS: Ill-defined opacities and areas of mild interstitial prominence are noted throughout the mid to lower lungs bilaterally. Small left pleural effusion or chronic pleuroparenchymal scarring, stable compared to remote prior study from 2019. No pneumothorax. No evidence of pulmonary edema. Heart size is mildly enlarged. Upper mediastinal contours are within normal limits. Aortic atherosclerosis. IMPRESSION: 1. There are subtle findings in the mid to lower lungs which suggest developing multilobar bilateral pneumonia, compatible with reported COVID infection. 2. Mild cardiomegaly. 3. Aortic atherosclerosis. Electronically Signed   By: Vinnie Langton M.D.   On: 08/09/2020 09:14   [place] IMAGE GUIDED FLUID DRAIN BY CATHETER  Result Date: 08/09/2020 INDICATION: 80 year old female with a history incarcerated ventral hernia containing colon and appendix, now with abscess,  presumably from appendicitis EXAM: ULTRASOUND-GUIDED DRAINAGE OF ABDOMINAL WALL ABSCESS MEDICATIONS: The patient is currently admitted to the  hospital and receiving intravenous antibiotics. The antibiotics were administered within an appropriate time frame prior to the initiation of the procedure. ANESTHESIA/SEDATION: Fentanyl 100 mcg IV; Versed 1.0 mg IV Moderate Sedation Time:  12 minutes The patient was continuously monitored during the procedure by the interventional radiology nurse under my direct supervision. COMPLICATIONS: None PROCEDURE: Informed written consent was obtained from the patient after a thorough discussion of the procedural risks, benefits and alternatives. All questions were addressed. Maximal Sterile Barrier Technique was utilized including caps, mask, sterile gowns, sterile gloves, sterile drape, hand hygiene and skin antiseptic. A timeout was performed prior to the initiation of the procedure. Patient positioned supine position on the ultrasound gantry. Scout images were acquired for planning purposes. The patient is prepped and draped in the usual sterile fashion. 1% lidocaine was used for local anesthesia. Once the patient was anesthetized, small stab incision was made with 11 blade scalpel. Trocar technique was used to place a 12 [place] drain into the abscess with complex fluid in the ventral wall. The 12 French catheter was advanced from the trocar and formed in the abscess. Approximately 150 cc of foul-smelling dark fluid aspirated. Culture was sent. Catheter was sutured in position and a final image was stored. Catheter was attached to bulb suction. Patient tolerated the procedure well and remained hemodynamically stable throughout. No complications were encountered and no significant blood loss. IMPRESSION: Status post ultrasound-guided drainage of ventral wall abscess with sample sent for culture. Signed, Dulcy Fanny. Dellia Nims, RPVI [facility] Electronically Signed   By: Corrie Mckusick D.O.   On: 08/09/2020 16:27    Labs:  CBC: Recent Labs    05/23/20 1430  08/08/20 1756 08/09/20 0735 08/10/20 0452  WBC 10.4 24.6* 20.6* 19.0*  HGB 9.4* 10.4* 8.6* 8.8*  HCT 28.7* 32.2* 27.3* 28.3*  PLT 281.0 340 291 292    COAGS: Recent Labs    08/09/20 0957  INR 1.2    BMP: Recent Labs    02/19/20 1359 05/07/20 1036 05/12/20 0542 08/08/20 1756 08/09/20 0735 08/10/20 0452  NA 140   < > 136 132* 133* 136  K 4.1   < > 3.8 3.4* 3.5 4.1  CL 102   < > 100 92* 96* 97*  CO2 29   < > 28 25 27 27   GLUCOSE 118*   < > 103* 110* 104* 125*  BUN 38*   < > 21 35* 33* 40*  CALCIUM 9.0   < > 8.8* 9.5 8.6* 9.0  CREATININE 1.80*   < > 1.64* 1.39* 1.45*  1.44* 1.65*  GFRNONAA 26*   < > 29* 38* 36*  37* 31*  GFRAA 30*  --   --   --   --   --    < > = values in this interval not displayed.    LIVER FUNCTION TESTS: Recent Labs    05/07/20 1036 05/08/20 0336 08/08/20 1756 08/10/20 0452  BILITOT 0.4 1.0 0.7 0.4  AST 13* 12* 24 18  ALT 10 9 15 14   ALKPHOS 51 45 66 51  PROT 6.6 6.0* 7.6 6.0*  ALBUMIN 3.4* 3.1* 3.2* 2.2*    Assessment and Plan: Pt with hx COVID -19, incarcerated ventral hernia containing colon and appendix, now with abscess within hernia, ? appendiceal/bowel origin; afebrile; WBC 19(20.6), hgb 8.8, creat 1.65, drain fl cx  pend; cont drain irrigation/OP monitoring; CCS considering surgical intervention   Electronically Signed: D. Rowe Robert, PA-C 08/10/2020, 10:14 AM       Patient ID: [name], female   DOB: 1940-12-27, 80 y.o.   MRN: [id number]

## 2020-08-10 NOTE — Progress Notes (Signed)
Progress Note  Patient Name: [name] Date of Encounter: 08/10/2020  [facility] Cardiologist: Larae Grooms, MD   Subjective   See below  Inpatient Medications    Scheduled Meds: . allopurinol  100 mg Oral Daily  . fluticasone furoate-vilanterol  1 puff Inhalation Daily  . heparin  5,000 Units Subcutaneous Q8H  . isosorbide-hydrALAZINE  1 tablet Oral BID  . methylPREDNISolone (SOLU-MEDROL) injection  40 mg Intravenous Q12H  . metoprolol tartrate  12.5 mg Oral BID  . Pitavastatin Magnesium  2 mg Oral Daily  . sodium chloride flush  5 mL Intracatheter Q8H   Continuous Infusions: . lactated ringers Stopped (08/10/20 1042)  . piperacillin-tazobactam (ZOSYN)  IV 3.375 g (08/10/20 0541)  . remdesivir 100 mg in NS 100 mL 100 mg (08/10/20 1051)   PRN Meds: albuterol, HYDROmorphone (DILAUDID) injection, metoprolol tartrate, ondansetron **OR** ondansetron (ZOFRAN) IV   Vital Signs    Vitals:   08/09/20 2329 08/10/20 0300 08/10/20 0642 08/10/20 0818  BP: (!) 153/98 (!) 160/88 (!) 155/84 (!) 163/48  Pulse: 67 66 (!) 52 (!) 55  Resp: 14 14 16 17   Temp: 97.9 F (36.6 C) 98 F (36.7 C) 98.9 F (37.2 C) 97.7 F (36.5 C)  TempSrc: Oral Axillary Oral Oral  SpO2: 100% 95% 100% 97%  Weight: 86.1 kg     Height: 5' (1.524 m)       Intake/Output Summary (Last 24 hours) at 08/10/2020 1113 Last data filed at 08/10/2020 0451 Gross per 24 hour  Intake 893.54 ml  Output 100 ml  Net 793.54 ml   Last 3 Weights 08/09/2020 08/08/2020 08/03/2020  Weight (lbs) 189 lb 13.1 oz 183 lb 183 lb 6.4 oz  Weight (kg) 86.1 kg 83.008 kg 83.19 kg      Telemetry    Sinus rhythm with rates in the 50's to 60's. A couple of pauses noted (longest one 2.13 seconds). - Personally Reviewed  ECG    No new ECG tracing today. - Personally Reviewed  Physical Exam   GEN: No acute distress.   See my exam below  Labs    High Sensitivity Troponin:  No results for input(s): TROPONINIHS in  the last 720 hours.    Chemistry Recent Labs  Lab 08/08/20 1756 08/09/20 0735 08/10/20 0452  NA 132* 133* 136  K 3.4* 3.5 4.1  CL 92* 96* 97*  CO2 25 27 27   GLUCOSE 110* 104* 125*  BUN 35* 33* 40*  CREATININE 1.39* 1.45*  1.44* 1.65*  CALCIUM 9.5 8.6* 9.0  PROT 7.6  --  6.0*  ALBUMIN 3.2*  --  2.2*  AST 24  --  18  ALT 15  --  14  ALKPHOS 66  --  51  BILITOT 0.7  --  0.4  GFRNONAA 38* 36*  37* 31*  ANIONGAP 15 10 12      Hematology Recent Labs  Lab 08/08/20 1756 08/09/20 0735 08/10/20 0452  WBC 24.6* 20.6* 19.0*  RBC 4.09 3.47* 3.54*  HGB 10.4* 8.6* 8.8*  HCT 32.2* 27.3* 28.3*  MCV 78.7* 78.7* 79.9*  MCH 25.4* 24.8* 24.9*  MCHC 32.3 31.5 31.1  RDW 14.4 14.5 14.4  PLT 340 291 292    BNPNo results for input(s): BNP, PROBNP in the last 168 hours.   DDimer  Recent Labs  Lab 08/09/20 0957 08/10/20 0452  DDIMER 2.86* 2.52*     Radiology    DG Abdomen 1 View  Result Date: 08/08/2020 CLINICAL DATA:  Abdominal hernia concerns for strangulation. EXAM: ABDOMEN - 1 VIEW COMPARISON:  CT abdomen pelvis February 08, 2020 and abdominal radiograph August 12, 2018. FINDINGS: The bowel gas pattern is normal with gas visualized level of colon. Thoracolumbar spondylosis. IMPRESSION: Nonobstructive bowel gas pattern. Electronically Signed   By: Dahlia Bailiff MD   On: 08/08/2020 18:20   CT ABDOMEN PELVIS W CONTRAST  Result Date: 08/09/2020 CLINICAL DATA:  Persistent abdominal pain from known hernia x3 weeks. EXAM: CT ABDOMEN AND PELVIS WITH CONTRAST TECHNIQUE: Multidetector CT imaging of the abdomen and pelvis was performed using the standard protocol following bolus administration of intravenous contrast. CONTRAST:  11m OMNIPAQUE IOHEXOL 300 MG/ML  SOLN COMPARISON:  February 08, 2020 FINDINGS: Lower chest: There is atelectasis at the lung bases. There is a fat containing Bochdalek hernia on the right.The heart size is normal. Hepatobiliary: The liver is normal. Normal  gallbladder.There is no biliary ductal dilation. Pancreas: Normal contours without ductal dilatation. No peripancreatic fluid collection. Spleen: There is splenomegaly with the spleen measuring approximately 13 cm craniocaudad. Adrenals/Urinary Tract: --Adrenal glands: Unremarkable. --Right kidney/ureter: There is a solid exophytic mass arising from the interpolar region of the right kidney measuring 3.5 cm (axial series 2, image 30). Multiple cysts are noted throughout the right kidney. There is no right-sided hydronephrosis. --Left kidney/ureter: Multiple cysts are noted. --Urinary bladder: Unremarkable. Stomach/Bowel: --Stomach/Duodenum: No hiatal hernia or other gastric abnormality. Normal duodenal course and caliber. --Small bowel: Unremarkable. --Colon: There is a moderate amount of stool at the level of the rectum. There is scattered colonic diverticula without CT evidence for diverticulitis. Again noted is a large ventral wall hernia containing a loop of the transverse colon in addition to portions of the patient's appendix. There is now a large air and fluid collection in the hernia sac measuring approximately 9.4 x 7.2 cm. There is significant adjacent fat stranding and overlying skin thickening. --Appendix: The visualized portions of the intraperitoneal appendix are unremarkable. The portions that are herniated are difficult to evaluate. Vascular/Lymphatic: Atherosclerotic calcification is present within the non-aneurysmal abdominal aorta, without hemodynamically significant stenosis. --there are few prominent, nonspecific retroperitoneal lymph nodes. --No mesenteric lymphadenopathy. --No pelvic or inguinal lymphadenopathy. Reproductive: Unremarkable Other: Again noted is a large ventral wall hernia containing portions of the transverse colon as and a large volume of peritoneal fat. Musculoskeletal. There is diffuse osteopenia which limits detection of nondisplaced fractures. There is no definite acute  displaced fracture identified on today's study. IMPRESSION: 1. Large ventral wall hernia containing a loop of the transverse colon. While there is no evidence for an obstruction, there is a new large air and fluid collection within the hernia sac concerning for perforation of the transverse colon with resultant abscess formation or complicated diverticulitis. Alternatively, a portion of the appendix is located within the hernia sac. As such, the differential includes complicated appendicitis, although this is felt to be less likely. 2. There is a 3.5 cm right renal mass consistent with renal cell carcinoma until proven otherwise. 3. Splenomegaly. 4. Additional chronic findings as detailed above. Aortic Atherosclerosis (ICD10-I70.0). These results were called by telephone at the time of interpretation on 08/09/2020 at 2:57 am to provider DAN FLOYD , who verbally acknowledged these results. Electronically Signed   By: CConstance HolsterM.D.   On: 08/09/2020 03:00   DG CHEST PORT 1 VIEW  Result Date: 08/09/2020 CLINICAL DATA:  80year old female with history of COVID infection with increasing shortness of breath. EXAM: PORTABLE CHEST 1 VIEW COMPARISON:  Chest x-ray 01/01/2018. FINDINGS: Ill-defined opacities and areas of mild interstitial prominence are noted throughout the mid to lower lungs bilaterally. Small left pleural effusion or chronic pleuroparenchymal scarring, stable compared to remote prior study from 2019. No pneumothorax. No evidence of pulmonary edema. Heart size is mildly enlarged. Upper mediastinal contours are within normal limits. Aortic atherosclerosis. IMPRESSION: 1. There are subtle findings in the mid to lower lungs which suggest developing multilobar bilateral pneumonia, compatible with reported COVID infection. 2. Mild cardiomegaly. 3. Aortic atherosclerosis. Electronically Signed   By: Vinnie Langton M.D.   On: 08/09/2020 09:14   [place] IMAGE GUIDED FLUID DRAIN BY CATHETER  Result Date:  08/09/2020 INDICATION: 80 year old female with a history incarcerated ventral hernia containing colon and appendix, now with abscess, presumably from appendicitis EXAM: ULTRASOUND-GUIDED DRAINAGE OF ABDOMINAL WALL ABSCESS MEDICATIONS: The patient is currently admitted to the hospital and receiving intravenous antibiotics. The antibiotics were administered within an appropriate time frame prior to the initiation of the procedure. ANESTHESIA/SEDATION: Fentanyl 100 mcg IV; Versed 1.0 mg IV Moderate Sedation Time:  12 minutes The patient was continuously monitored during the procedure by the interventional radiology nurse under my direct supervision. COMPLICATIONS: None PROCEDURE: Informed written consent was obtained from the patient after a thorough discussion of the procedural risks, benefits and alternatives. All questions were addressed. Maximal Sterile Barrier Technique was utilized including caps, mask, sterile gowns, sterile gloves, sterile drape, hand hygiene and skin antiseptic. A timeout was performed prior to the initiation of the procedure. Patient positioned supine position on the ultrasound gantry. Scout images were acquired for planning purposes. The patient is prepped and draped in the usual sterile fashion. 1% lidocaine was used for local anesthesia. Once the patient was anesthetized, small stab incision was made with 11 blade scalpel. Trocar technique was used to place a 12 [place] drain into the abscess with complex fluid in the ventral wall. The 12 French catheter was advanced from the trocar and formed in the abscess. Approximately 150 cc of foul-smelling dark fluid aspirated. Culture was sent. Catheter was sutured in position and a final image was stored. Catheter was attached to bulb suction. Patient tolerated the procedure well and remained hemodynamically stable throughout. No complications were encountered and no significant blood loss. IMPRESSION: Status post ultrasound-guided drainage of  ventral wall abscess with sample sent for culture. Signed, Dulcy Fanny. Dellia Nims, RPVI [facility] Electronically Signed   By: Corrie Mckusick D.O.   On: 08/09/2020 16:27    Cardiac Studies   Echocardiogram 05/11/2020: Impressions: 1. Left ventricular ejection fraction, by estimation, is 60 to 65%. The  left ventricle has normal function. The left ventricle has no regional  wall motion abnormalities. There is moderate concentric left ventricular  hypertrophy. Left ventricular  diastolic parameters are consistent with Grade II diastolic dysfunction  (pseudonormalization).  2. Right ventricular systolic function is normal. The right ventricular  size is normal.  3. Left atrial size was mildly dilated.  4. The mitral valve is normal in structure. Trivial mitral valve  regurgitation.  5. The aortic valve is tricuspid. There is mild calcification of the  aortic valve. There is mild thickening of the aortic valve. Aortic valve  regurgitation is trivial. Mild aortic valve sclerosis is present, with no  evidence of aortic valve stenosis.  6. The inferior vena cava is normal in size with greater than 50%  respiratory variability, suggesting right atrial pressure of 3 mmHg.   Comparison(s): No significant change  from prior study.  Patient Profile     80 y.o. female with a history of chronic diastolic CHF, paroxysmal atrial fibrillation not on anticoagulation given history of GI bleed, bradycardia, obstructive sleep apnea not on CPAP, COPD on home O2, hypertension, pre-diabetes, GI bleed, MGUS, and obesity who was admitted on 08/09/2020 for large ventral hernia containing transverse colon after presenting with abdominal pain. Also found to have COVID. Cardiology consulted for pre-op evaluation at the request of Dr. Tawanna Solo.  Assessment & Plan    Pre-Op Evaluation - Patient presented with abdominal pain and found to have incarcerated  ventral hernia. Cardiology consulted for abdominal surgery as well as possible renal surgery due to incidental mass found on CT. - Per Dr. Rosezella Florida note on 1/11, she is high risk from a pulmonary standpoint alone. Cardiac wise, she is considered class III risk with 6.6% of MACE.   Chronic Diastolic CHF - Stable.  - Most recent Echo from 04/2020 showed LVEF of 60-65% with normal wall motion and grade 2 diastolic dysfunction.  - Home Torsemide on home. Patient received gentle hydration yesterday. - Continue home BiDil 1 tablet twice daily. - Continue to monitor volume status closely.  Paroxsymal Atrial Fibrillation Tachybrady Syndrome - Maintaining sinus rhythm. Rates in the 50's to 60's. - Beta blocker recently stopped due to bradycardia.  - No on chronic anticoagulation given prior GI bleed and chronic anemia.  CKD Stage III - Creatinine 1.39 on admission and up to 1.65 today. Looks like baseline is around 1.3 to 1.5. - Avoid Nephrotoxic agents.  - Continue to monitor closely.  Otherwise, per primary team: - Abdominal hernia - Intra-abdominal infection/abscess  - COPD on home O2 - COVID    For questions or updates, please contact [place] Please consult [email or web address] for contact info under        Signed, Darreld Mclean, PA-C  08/10/2020, 11:13 AM    History and all data above reviewed.  Patient examined.  I agree with the findings as above.  She says that she is breathing a little bit better today.  Less cough.  Still with abdominal pain with any movement or palpationThe patient exam reveals COR:No arrhythmia, 2/6 LUSB systolic murmur, no diastolic murmurs  ,  Lungs: decreased breath sounds  ,  Abd: Distended and tender, Ext Trace edema  .  All available labs, radiology testing, previous records reviewed. Agree with documented assessment and plan. maintiaing NSR.  No change in therapy. Continue Bidil.   No diuretic with increased creat and while NPO.  She is going to  talk to her family about whether she wants any further therapy.  We will see as needed.    Jeneen Rinks Sham Alviar  1:09 PM  08/10/2020

## 2020-08-10 NOTE — Plan of Care (Signed)
  Problem: Education: Goal: Knowledge of General Education information will improve Description: Including pain rating scale, medication(s)/side effects and non-pharmacologic comfort measures 08/10/2020 1755 by Layla Maw, RN Outcome: Not Progressing 08/10/2020 1754 by Layla Maw, RN Outcome: Not Progressing   Problem: Health Behavior/Discharge Planning: Goal: Ability to manage health-related needs will improve 08/10/2020 1755 by Layla Maw, RN Outcome: Not Progressing 08/10/2020 1754 by Layla Maw, RN Outcome: Not Progressing   Problem: Clinical Measurements: Goal: Ability to maintain clinical measurements within normal limits will improve 08/10/2020 1755 by Layla Maw, RN Outcome: Not Progressing 08/10/2020 1754 by Layla Maw, RN Outcome: Not Progressing Goal: Will remain free from infection 08/10/2020 1755 by Layla Maw, RN Outcome: Not Progressing 08/10/2020 1754 by Layla Maw, RN Outcome: Not Progressing Goal: Diagnostic test results will improve 08/10/2020 1755 by Layla Maw, RN Outcome: Not Progressing 08/10/2020 1754 by Layla Maw, RN Outcome: Not Progressing Goal: Respiratory complications will improve 08/10/2020 1755 by Layla Maw, RN Outcome: Not Progressing 08/10/2020 1754 by Layla Maw, RN Outcome: Not Progressing Goal: Cardiovascular complication will be avoided 08/10/2020 1755 by Layla Maw, RN Outcome: Not Progressing 08/10/2020 1754 by Layla Maw, RN Outcome: Not Progressing   Problem: Activity: Goal: Risk for activity intolerance will decrease 08/10/2020 1755 by Layla Maw, RN Outcome: Not Progressing 08/10/2020 1754 by Layla Maw, RN Outcome: Not Progressing   Problem: Nutrition: Goal: Adequate nutrition will be maintained 08/10/2020 1755 by Layla Maw, RN Outcome: Not Progressing 08/10/2020 1754 by Layla Maw, RN Outcome: Not Progressing   Problem:  Coping: Goal: Level of anxiety will decrease 08/10/2020 1755 by Layla Maw, RN Outcome: Not Progressing 08/10/2020 1754 by Layla Maw, RN Outcome: Not Progressing   Problem: Elimination: Goal: Will not experience complications related to bowel motility 08/10/2020 1755 by Layla Maw, RN Outcome: Not Progressing 08/10/2020 1754 by Layla Maw, RN Outcome: Not Progressing Goal: Will not experience complications related to urinary retention 08/10/2020 1755 by Layla Maw, RN Outcome: Not Progressing 08/10/2020 1754 by Layla Maw, RN Outcome: Not Progressing   Problem: Pain Managment: Goal: General experience of comfort will improve 08/10/2020 1755 by Layla Maw, RN Outcome: Not Progressing 08/10/2020 1754 by Layla Maw, RN Outcome: Not Progressing   Problem: Safety: Goal: Ability to remain free from injury will improve 08/10/2020 1755 by Layla Maw, RN Outcome: Not Progressing 08/10/2020 1754 by Layla Maw, RN Outcome: Not Progressing   Problem: Skin Integrity: Goal: Risk for impaired skin integrity will decrease 08/10/2020 1755 by Layla Maw, RN Outcome: Not Progressing 08/10/2020 1754 by Layla Maw, RN Outcome: Not Progressing

## 2020-08-10 NOTE — Progress Notes (Signed)
Patient has had lots of pain in her abdomen, treated with PRN meds. She was able to come to a decision about surgery and spoke with palliative. Pruwick in place with little output. Safety maintained, will continue to monitor.

## 2020-08-11 ENCOUNTER — Inpatient Hospital Stay (HOSPITAL_COMMUNITY): Payer: Medicare Other | Admitting: Certified Registered Nurse Anesthetist

## 2020-08-11 ENCOUNTER — Encounter (HOSPITAL_COMMUNITY)
Admission: EM | Disposition: A | Discharge status: Skilled Nursing Facility | Payer: Self-pay | Source: Home / Self Care | Attending: Internal Medicine

## 2020-08-11 DIAGNOSIS — Z7189 Other specified counseling: Secondary | ICD-10-CM | POA: Diagnosis not present

## 2020-08-11 DIAGNOSIS — I5032 Chronic diastolic (congestive) heart failure: Secondary | ICD-10-CM | POA: Diagnosis not present

## 2020-08-11 DIAGNOSIS — N179 Acute kidney failure, unspecified: Secondary | ICD-10-CM

## 2020-08-11 DIAGNOSIS — N1832 Chronic kidney disease, stage 3b: Secondary | ICD-10-CM | POA: Diagnosis not present

## 2020-08-11 DIAGNOSIS — R579 Shock, unspecified: Secondary | ICD-10-CM | POA: Diagnosis not present

## 2020-08-11 DIAGNOSIS — U071 COVID-19: Secondary | ICD-10-CM | POA: Diagnosis not present

## 2020-08-11 DIAGNOSIS — K651 Peritoneal abscess: Secondary | ICD-10-CM | POA: Diagnosis not present

## 2020-08-11 DIAGNOSIS — I10 Essential (primary) hypertension: Secondary | ICD-10-CM | POA: Diagnosis not present

## 2020-08-11 DIAGNOSIS — K436 Other and unspecified ventral hernia with obstruction, without gangrene: Secondary | ICD-10-CM | POA: Diagnosis not present

## 2020-08-11 HISTORY — PX: LAPAROTOMY: SHX154

## 2020-08-11 LAB — BASIC METABOLIC PANEL
Anion gap: 17 — ABNORMAL HIGH (ref 5–15)
BUN: 63 mg/dL — ABNORMAL HIGH (ref 8–23)
CO2: 24 mmol/L (ref 22–32)
Calcium: 8.8 mg/dL — ABNORMAL LOW (ref 8.9–10.3)
Chloride: 95 mmol/L — ABNORMAL LOW (ref 98–111)
Creatinine, Ser: 2.24 mg/dL — ABNORMAL HIGH (ref 0.44–1.00)
GFR, Estimated: 22 mL/min — ABNORMAL LOW (ref >60)
Glucose, Bld: 122 mg/dL — ABNORMAL HIGH (ref 70–99)
Potassium: 3.7 mmol/L (ref 3.5–5.1)
Sodium: 136 mmol/L (ref 135–145)

## 2020-08-11 LAB — BLOOD GAS, ARTERIAL
Acid-base deficit: 1.7 mmol/L (ref 0.0–2.0)
Bicarbonate: 24.3 mmol/L (ref 20.0–28.0)
Drawn by: 25788
O2 Content: 2.5 L/min
O2 Saturation: 96.6 %
Patient temperature: 37
pCO2 arterial: 51.7 mmHg — ABNORMAL HIGH (ref 32.0–48.0)
pH, Arterial: 7.293 — ABNORMAL LOW (ref 7.350–7.450)
pO2, Arterial: 105 mmHg (ref 83.0–108.0)

## 2020-08-11 LAB — COMPREHENSIVE METABOLIC PANEL
ALT: 12 U/L (ref 0–44)
AST: 15 U/L (ref 15–41)
Albumin: 2 g/dL — ABNORMAL LOW (ref 3.5–5.0)
Alkaline Phosphatase: 46 U/L (ref 38–126)
Anion gap: 14 (ref 5–15)
BUN: 63 mg/dL — ABNORMAL HIGH (ref 8–23)
CO2: 25 mmol/L (ref 22–32)
Calcium: 8.4 mg/dL — ABNORMAL LOW (ref 8.9–10.3)
Chloride: 98 mmol/L (ref 98–111)
Creatinine, Ser: 2.37 mg/dL — ABNORMAL HIGH (ref 0.44–1.00)
GFR, Estimated: 20 mL/min — ABNORMAL LOW (ref >60)
Glucose, Bld: 163 mg/dL — ABNORMAL HIGH (ref 70–99)
Potassium: 3.7 mmol/L (ref 3.5–5.1)
Sodium: 137 mmol/L (ref 135–145)
Total Bilirubin: 0.5 mg/dL (ref 0.3–1.2)
Total Protein: 5.4 g/dL — ABNORMAL LOW (ref 6.5–8.1)

## 2020-08-11 LAB — CBC
HCT: 28.8 % — ABNORMAL LOW (ref 36.0–46.0)
HCT: 26.2 % — ABNORMAL LOW (ref 36.0–46.0)
Hemoglobin: 8.9 g/dL — ABNORMAL LOW (ref 12.0–15.0)
Hemoglobin: 8.3 g/dL — ABNORMAL LOW (ref 12.0–15.0)
MCH: 24.7 pg — ABNORMAL LOW (ref 26.0–34.0)
MCH: 25.1 pg — ABNORMAL LOW (ref 26.0–34.0)
MCHC: 30.9 g/dL (ref 30.0–36.0)
MCHC: 31.7 g/dL (ref 30.0–36.0)
MCV: 80 fL (ref 80.0–100.0)
MCV: 79.2 fL — ABNORMAL LOW (ref 80.0–100.0)
Platelets: 339 10*3/uL (ref 150–400)
Platelets: 560 10*3/uL — ABNORMAL HIGH (ref 150–400)
RBC: 3.6 MIL/uL — ABNORMAL LOW (ref 3.87–5.11)
RBC: 3.31 MIL/uL — ABNORMAL LOW (ref 3.87–5.11)
RDW: 14.7 % (ref 11.5–15.5)
RDW: 14.8 % (ref 11.5–15.5)
WBC: 17.2 10*3/uL — ABNORMAL HIGH (ref 4.0–10.5)
WBC: 42 10*3/uL — ABNORMAL HIGH (ref 4.0–10.5)
nRBC: 0 % (ref 0.0–0.2)
nRBC: 0 % (ref 0.0–0.2)

## 2020-08-11 LAB — FERRITIN: Ferritin: 202 ng/mL (ref 11–307)

## 2020-08-11 LAB — MRSA PCR SCREENING: MRSA by PCR: NEGATIVE

## 2020-08-11 LAB — D-DIMER, QUANTITATIVE: D-Dimer, Quant: 2.51 ug/mL-FEU — ABNORMAL HIGH (ref 0.00–0.50)

## 2020-08-11 LAB — C-REACTIVE PROTEIN: CRP: 15 mg/dL — ABNORMAL HIGH (ref <1.0)

## 2020-08-11 LAB — LACTIC ACID, PLASMA: Lactic Acid, Venous: 2 mmol/L (ref 0.5–1.9)

## 2020-08-11 SURGERY — LAPAROTOMY, EXPLORATORY
Anesthesia: General | Site: Abdomen

## 2020-08-11 MED ORDER — ONDANSETRON HCL 4 MG/2ML IJ SOLN
INTRAMUSCULAR | Status: DC | PRN
  Administered 2020-08-11: 4 mg via INTRAVENOUS

## 2020-08-11 MED ORDER — PHENYLEPHRINE 40 MCG/ML (10ML) SYRINGE FOR IV PUSH (FOR BLOOD PRESSURE SUPPORT)
PREFILLED_SYRINGE | INTRAVENOUS | Status: AC
  Filled 2020-08-11: qty 10

## 2020-08-11 MED ORDER — ORAL CARE MOUTH RINSE
15.0000 mL | Freq: Two times a day (BID) | OROMUCOSAL | Status: DC
  Administered 2020-08-11 – 2020-08-19 (×15): 15 mL via OROMUCOSAL

## 2020-08-11 MED ORDER — DEXAMETHASONE SODIUM PHOSPHATE 4 MG/ML IJ SOLN
INTRAMUSCULAR | Status: DC | PRN
  Administered 2020-08-11: 5 mg via INTRAVENOUS

## 2020-08-11 MED ORDER — HYDROMORPHONE HCL 1 MG/ML IJ SOLN
0.5000 mg | Freq: Once | INTRAMUSCULAR | Status: AC
  Administered 2020-08-11: 0.5 mg via INTRAVENOUS

## 2020-08-11 MED ORDER — DEXAMETHASONE SODIUM PHOSPHATE 10 MG/ML IJ SOLN
INTRAMUSCULAR | Status: AC
  Filled 2020-08-11: qty 1

## 2020-08-11 MED ORDER — SODIUM CHLORIDE 0.9 % IV BOLUS
500.0000 mL | Freq: Once | INTRAVENOUS | Status: AC
  Administered 2020-08-11: 500 mL via INTRAVENOUS

## 2020-08-11 MED ORDER — PHENYLEPHRINE HCL-NACL 10-0.9 MG/250ML-% IV SOLN
0.0000 ug/min | INTRAVENOUS | Status: DC
  Administered 2020-08-11: 20 ug/min via INTRAVENOUS
  Administered 2020-08-11: 50 ug/min via INTRAVENOUS
  Administered 2020-08-12: 30 ug/min via INTRAVENOUS
  Administered 2020-08-12 (×2): 50 ug/min via INTRAVENOUS
  Administered 2020-08-12: 40 ug/min via INTRAVENOUS
  Filled 2020-08-11 (×6): qty 250

## 2020-08-11 MED ORDER — SUCCINYLCHOLINE CHLORIDE 200 MG/10ML IV SOSY
PREFILLED_SYRINGE | INTRAVENOUS | Status: DC | PRN
  Administered 2020-08-11: 120 mg via INTRAVENOUS

## 2020-08-11 MED ORDER — 0.9 % SODIUM CHLORIDE (POUR BTL) OPTIME
TOPICAL | Status: DC | PRN
  Administered 2020-08-11: 2000 mL

## 2020-08-11 MED ORDER — FENTANYL CITRATE (PF) 100 MCG/2ML IJ SOLN
INTRAMUSCULAR | Status: AC
  Filled 2020-08-11: qty 2

## 2020-08-11 MED ORDER — EPHEDRINE 5 MG/ML INJ
INTRAVENOUS | Status: AC
  Filled 2020-08-11: qty 10

## 2020-08-11 MED ORDER — LACTATED RINGERS IV BOLUS
1000.0000 mL | Freq: Once | INTRAVENOUS | Status: AC
  Administered 2020-08-11: 1000 mL via INTRAVENOUS

## 2020-08-11 MED ORDER — GLYCOPYRROLATE PF 0.2 MG/ML IJ SOSY
PREFILLED_SYRINGE | INTRAMUSCULAR | Status: DC | PRN
  Administered 2020-08-11: .2 mg via INTRAVENOUS

## 2020-08-11 MED ORDER — HYDROMORPHONE HCL 1 MG/ML IJ SOLN
0.2500 mg | INTRAMUSCULAR | Status: DC | PRN
  Administered 2020-08-11 (×3): 0.5 mg via INTRAVENOUS

## 2020-08-11 MED ORDER — PROPOFOL 10 MG/ML IV BOLUS
INTRAVENOUS | Status: DC | PRN
  Administered 2020-08-11: 130 mg via INTRAVENOUS

## 2020-08-11 MED ORDER — ISOSORB DINITRATE-HYDRALAZINE 20-37.5 MG PO TABS
1.0000 | ORAL_TABLET | Freq: Two times a day (BID) | ORAL | Status: DC
  Filled 2020-08-11: qty 1

## 2020-08-11 MED ORDER — FENTANYL CITRATE (PF) 100 MCG/2ML IJ SOLN
INTRAMUSCULAR | Status: DC | PRN
  Administered 2020-08-11: 100 ug via INTRAVENOUS
  Administered 2020-08-11: 25 ug via INTRAVENOUS
  Administered 2020-08-11: 50 ug via INTRAVENOUS
  Administered 2020-08-11: 25 ug via INTRAVENOUS
  Administered 2020-08-11 (×4): 50 ug via INTRAVENOUS

## 2020-08-11 MED ORDER — EPHEDRINE SULFATE-NACL 50-0.9 MG/10ML-% IV SOSY
PREFILLED_SYRINGE | INTRAVENOUS | Status: DC | PRN
  Administered 2020-08-11 (×2): 5 mg via INTRAVENOUS

## 2020-08-11 MED ORDER — METOPROLOL TARTRATE 12.5 MG HALF TABLET: 12.5000 mg | ORAL_TABLET | Freq: Two times a day (BID) | ORAL | Status: DC

## 2020-08-11 MED ORDER — LIDOCAINE 2% (20 MG/ML) 5 ML SYRINGE
INTRAMUSCULAR | Status: DC | PRN
  Administered 2020-08-11: 80 mg via INTRAVENOUS

## 2020-08-11 MED ORDER — HYDROMORPHONE HCL 1 MG/ML IJ SOLN
0.5000 mg | INTRAMUSCULAR | Status: DC | PRN
  Administered 2020-08-11 – 2020-08-12 (×5): 0.5 mg via INTRAVENOUS
  Administered 2020-08-12: 1 mg via INTRAVENOUS
  Administered 2020-08-12: 0.5 mg via INTRAVENOUS
  Administered 2020-08-12 – 2020-08-15 (×10): 1 mg via INTRAVENOUS
  Administered 2020-08-15: 0.5 mg via INTRAVENOUS
  Administered 2020-08-15 – 2020-08-19 (×12): 1 mg via INTRAVENOUS
  Filled 2020-08-11 (×31): qty 1

## 2020-08-11 MED ORDER — ROCURONIUM BROMIDE 10 MG/ML (PF) SYRINGE
PREFILLED_SYRINGE | INTRAVENOUS | Status: AC
  Filled 2020-08-11: qty 10

## 2020-08-11 MED ORDER — ROCURONIUM BROMIDE 10 MG/ML (PF) SYRINGE
PREFILLED_SYRINGE | INTRAVENOUS | Status: DC | PRN
  Administered 2020-08-11: 60 mg via INTRAVENOUS

## 2020-08-11 MED ORDER — SODIUM CHLORIDE 0.9 % IV SOLN: 750.0000 mg | Freq: Once | INTRAVENOUS | Status: DC

## 2020-08-11 MED ORDER — CHLORHEXIDINE GLUCONATE CLOTH 2 % EX PADS
6.0000 | MEDICATED_PAD | Freq: Every day | CUTANEOUS | Status: DC
  Administered 2020-08-12 – 2020-08-19 (×8): 6 via TOPICAL

## 2020-08-11 MED ORDER — HYDROMORPHONE HCL 2 MG/ML IJ SOLN
INTRAMUSCULAR | Status: AC
  Filled 2020-08-11: qty 1

## 2020-08-11 MED ORDER — GLYCOPYRROLATE PF 0.2 MG/ML IJ SOSY
PREFILLED_SYRINGE | INTRAMUSCULAR | Status: AC
  Filled 2020-08-11: qty 1

## 2020-08-11 MED ORDER — LIDOCAINE HCL (PF) 2 % IJ SOLN
INTRAMUSCULAR | Status: AC
  Filled 2020-08-11: qty 5

## 2020-08-11 MED ORDER — ONDANSETRON HCL 4 MG/2ML IJ SOLN
INTRAMUSCULAR | Status: AC
  Filled 2020-08-11: qty 2

## 2020-08-11 MED ORDER — DEXTROSE IN LACTATED RINGERS 5 % IV SOLN: INTRAVENOUS | Status: DC

## 2020-08-11 MED ORDER — HYDRALAZINE HCL 20 MG/ML IJ SOLN
INTRAMUSCULAR | Status: AC
  Filled 2020-08-11: qty 1

## 2020-08-11 MED ORDER — PHENYLEPHRINE 40 MCG/ML (10ML) SYRINGE FOR IV PUSH (FOR BLOOD PRESSURE SUPPORT)
PREFILLED_SYRINGE | INTRAVENOUS | Status: DC | PRN
  Administered 2020-08-11: 80 ug via INTRAVENOUS

## 2020-08-11 MED ORDER — SUGAMMADEX SODIUM 200 MG/2ML IV SOLN
INTRAVENOUS | Status: DC | PRN
  Administered 2020-08-11: 200 mg via INTRAVENOUS

## 2020-08-11 SURGICAL SUPPLY — 21 items
CHLORAPREP W/TINT 26 (MISCELLANEOUS) ×2 IMPLANT
COVER WAND RF STERILE (DRAPES) IMPLANT
DRAPE LAPAROSCOPIC ABDOMINAL (DRAPES) ×2 IMPLANT
DRSG PAD ABDOMINAL 8X10 ST (GAUZE/BANDAGES/DRESSINGS) ×2 IMPLANT
ELECT REM PT RETURN 15FT ADLT (MISCELLANEOUS) ×2 IMPLANT
GAUZE SPONGE 4X4 12PLY STRL (GAUZE/BANDAGES/DRESSINGS) ×2 IMPLANT
GLOVE BIO SURGEON STRL SZ7.5 (GLOVE) ×2 IMPLANT
GLOVE SURG UNDER POLY LF SZ7 (GLOVE) ×2 IMPLANT
GOWN STRL REUS W/TWL LRG LVL3 (GOWN DISPOSABLE) ×2 IMPLANT
GOWN STRL REUS W/TWL XL LVL3 (GOWN DISPOSABLE) ×4 IMPLANT
KIT BASIN OR (CUSTOM PROCEDURE TRAY) ×2 IMPLANT
KIT TURNOVER KIT A (KITS) IMPLANT
NS IRRIG 1000ML POUR BTL (IV SOLUTION) ×2 IMPLANT
PACK GENERAL/GYN (CUSTOM PROCEDURE TRAY) ×2 IMPLANT
PENCIL SMOKE EVACUATOR (MISCELLANEOUS) IMPLANT
RELOAD PROXIMATE 75MM BLUE (ENDOMECHANICALS) ×4 IMPLANT
SHEARS HARMONIC ACE PLUS 36CM (ENDOMECHANICALS) IMPLANT
STAPLER PROXIMATE 75MM BLUE (STAPLE) ×2 IMPLANT
STAPLER VISISTAT 35W (STAPLE) ×2 IMPLANT
TOWEL OR 17X26 10 PK STRL BLUE (TOWEL DISPOSABLE) ×2 IMPLANT
TRAY FOLEY MTR SLVR 16FR STAT (SET/KITS/TRAYS/PACK) ×2 IMPLANT

## 2020-08-11 NOTE — Progress Notes (Signed)
Daily Progress Note   Patient Name: [name]       Date: 08/11/2020 DOB: 08-Nov-1940  Age: 80 y.o. MRN#: [id number] Attending Physician: Patrecia Pour, MD Primary Care Physician: [name], MD Admit Date: 08/09/2020  Reason for Consultation/Follow-up: Establishing goals of care  Subjective: Received call and discussed with [name] surgery team this morning.  [name] has confirmed desire to undergo surgical intervention but requested that I come and talk with her again about plan if she suffers complications during surgery.  Yesterday when I spoke with her, her biggest fear was not being able to be weaned from mechanical ventilation.  I saw and examined [name] today.  We discussed again regarding plan for surgery concern for complications.  Reviewed again her desire not to be prolonged on mechanical ventilation for long period of time if it does not appear she is going to be able to reach a point where she is able to be weaned postsurgery.  We also reviewed that her son, [name], and her daughter, [name], will be her surrogate in the event that she cannot make her own decisions.  She continues to be concerned about the welfare of her disabled daughter and also about property that family owns if she were to not survive this hospitalization.  She requested pen and paper to write messages to her family outlining her wishes in the event she does not survive.  I provided her with pen, paper, and a clipboard.  She asked for some privacy to complete this.  Length of Stay: 2  Current Medications: Scheduled Meds:  . allopurinol  100 mg Oral Daily  . fluticasone furoate-vilanterol  1 puff Inhalation Daily  . heparin  5,000 Units Subcutaneous Q8H  . isosorbide-hydrALAZINE  1 tablet  Oral BID  . metoprolol tartrate  12.5 mg Oral BID  . Pitavastatin Magnesium  2 mg Oral Daily  . sodium chloride flush  5 mL Intracatheter Q8H    Continuous Infusions: . lactated ringers 75 mL/hr at 08/11/20 0138  . piperacillin-tazobactam (ZOSYN)  IV Stopped (08/11/20 1010)  . remdesivir 100 mg in NS 100 mL 100 mg (08/10/20 1051)    PRN Meds: albuterol, HYDROmorphone (DILAUDID) injection, metoprolol tartrate, ondansetron **OR** ondansetron (ZOFRAN) IV  Physical Exam  General: Alert, awake, in no acute distress.  HEENT: No bruits, no goiter, no JVD Heart: Regular rate and rhythm.. Ext: No significant edema Skin: Warm and dry Neuro: Grossly intact, nonfocal.   Vital Signs: BP (!) 151/39   Pulse (!) 54   Temp 97.9 F (36.6 C) (Oral)   Resp 18   Ht 5' (1.524 m)   Wt 86.1 kg   SpO2 98%   BMI 37.07 kg/m  SpO2: SpO2: 98 % O2 Device: O2 Device: Nasal Cannula O2 Flow Rate: O2 Flow Rate (L/min): 2 L/min  Intake/output summary:   Intake/Output Summary (Last 24 hours) at 08/11/2020 1011 Last data filed at 08/11/2020 0549 Gross per 24 hour  Intake 161.24 ml  Output 100 ml  Net 61.24 ml   LBM: Last BM Date: 08/04/20 Baseline Weight: Weight: 83 kg Most recent weight: Weight: 86.1 kg       Palliative Assessment/Data:      Patient Active Problem List   Diagnosis Date Noted  . Irreducible ventral hernia 08/09/2020  . Infection as cause of abscess of colon 08/09/2020  . COPD (chronic obstructive pulmonary disease) ([place]) 08/09/2020  . Left lower quadrant abdominal tenderness without rebound tenderness 02/06/2020  . Chronic respiratory failure with hypoxia ([place]) 02/06/2020  . Routine general medical examination at a health care facility 02/06/2020  . Umbilical hernia with obstruction 08/27/2018  . AVM (arteriovenous malformation) of small bowel, acquired with hemorrhage 08/14/2018  . Hernia, ventral 08/12/2018  . Multiple lung nodules on CT 05/05/2018  .  Stenosis of cervical spine with myelopathy ([place]) 01/01/2018  . Atrial fibrillation ([place]) 10/17/2017  . CHF (congestive heart failure), NYHA class III, chronic, diastolic ([place]) 91/50/5697  . Symptomatic anemia 08/10/2017  . Angiodysplasia of cecum   . Angiodysplasia of duodenum   . Grade III diastolic dysfunction 94/80/1655  . MGUS (monoclonal gammopathy of unknown significance) 11/08/2016  . Asteatotic eczema 02/25/2016  . COLD (chronic obstructive lung disease) ([place])   . Anemia, iron deficiency 11/16/2014  . Cor pulmonale ([place]) 11/16/2014  . OSA (obstructive sleep apnea) 06/22/2013  . Chronic venous stasis dermatitis of both lower extremities 06/02/2013  . Vitamin D deficiency 02/06/2013  . Osteopenia, senile 02/05/2013  . Other screening mammogram 02/05/2013  . Chronic kidney disease, stage 3b ([place]) 06/20/2010  . Morbid obesity ([place]) 04/19/2010  . Gout 01/10/2009  . ULCERATIVE COLITIS-LEFT SIDE 03/19/2008  . Vitamin B12 deficiency anemia 11/13/2006  . Hyperlipidemia with target LDL less than 100 11/27/1996  . GERD 07/30/1992  . HTN (hypertension) 07/30/1988    Palliative Care Assessment & Plan   Patient Profile: 80 year old female with past medical history of COPD, CKD, CHF longstanding hernia admitted with incarceration of hernia and evidence of perforation.  Plan is for surgical intervention with understanding high risk of complications per patient preference to pursue surgical intervention.  Recommendations/Plan:  Full code/full scope  [name] confirmed again that she would like to proceed with surgery today.  She has spoken extensively with surgery team and myself and seems to understand risks well.  She again expressed that her fear would be becoming dependent upon life support for prolonged period of time.  We discussed again plan for surgical intervention, however, if it appears that she is not going to be able to be weaned from ventilator support, her wish would be  to focus on comfort and liberation from mechanical ventilation.  She again reports that her son, [name], and her daughter, [name], would be her surrogate decision makers (  her husband has dementia and her other daughter is disabled).  I had a contact information for [name] and [name] to her chart.  She requested paper and pen in order to record personal messages for her family and desires for her possessions in the event she does not survive surgery.  She expressed worrying about the welfare of her disabled daughter if she does not survive this hospitalization.  Palliative will continue to follow postoperatively.  She declined me to call her family today to discuss further.  Goals of Care and Additional Recommendations:  Limitations on Scope of Treatment: Full Scope Treatment  Code Status:    Code Status Orders  (From admission, onward)         Start     Ordered   08/09/20 0735  Full code  Continuous        08/09/20 0735        Code Status History    Date Active Date Inactive Code Status Order ID Comments User Context   05/07/2020 1401 05/12/2020 2028 Partial Code [id number]  Jonnie Finner, DO Inpatient   08/12/2018 1332 08/14/2018 1620 Full Code [id number]  Marcell Anger, MD Inpatient   08/04/2018 1522 08/06/2018 2146 DNR [id number]  Edwin Dada, MD ED   10/17/2017 2309 10/20/2017 2047 DNR [id number]  Rise Patience, MD ED   08/09/2017 0040 08/15/2017 1555 DNR [id number]  Elwin Mocha, MD ED   11/08/2016 2026 11/11/2016 2001 Partial Code [id number]  Vianne Bulls, MD ED   03/19/2016 2109 03/22/2016 2157 Partial Code [id number]  Toy Baker, MD Inpatient   11/16/2014 1524 11/23/2014 1638 DNR [id number]  Samella Parr, NP Inpatient   06/01/2013 2001 06/05/2013 1502 Full Code [id number]  Aldean Jewett, MD ED   Advance Care Planning Activity       Prognosis:   Guarded  Discharge Planning:  To Be Determined  Care plan was discussed with patient, surgical  team  Thank you for allowing the Palliative Medicine Team to assist in the care of this patient.   Time In: 0930 Time Out: 1000 Total Time 30 Prolonged Time Billed No      Greater than 50%  of this time was spent counseling and coordinating care related to the above assessment and plan.  Micheline Rough, MD  Please contact Palliative Medicine Team phone at [phone] for questions and concerns.

## 2020-08-11 NOTE — Progress Notes (Signed)
PROGRESS NOTE  [name]  WUJ:[id number] DOB: February 15, 1941 DOA: 08/09/2020 PCP: [name], MD   Brief Narrative: [name] is an 80 y.o. female with a history of 2L O2-dependent COPD, stage IIIbCKD, chronic HFpEF, morbid obesity who presented to the ED with 3 weeks of worsening abdominal pain and enlargement of chronic ventral hernia found to have hernia incarceration with evidence of perforation (diverticular vs. appendix) as well as covid-19 pneumonia. IR performed aspiration and drain placement 1/11 which returned feculent material. General surgery is recommending operative management as this is the only effective treatment. Cardiology and pulmonary have been consulted for opinions and recommendations regarding operative risk, of course stating that the patient is at very high risk for perioperative complications including protracted course of intubation and death, though no further testing or optimization was recommended. Palliative discussions are ongoing and palliative care is formally consulted. Zosyn, remdesivir, and solumedrol were started. Plan is for surgery 08/11/2020.  Assessment & Plan: Principal Problem:   Irreducible ventral hernia Active Problems:   Morbid obesity (HCC)   HTN (hypertension)   Chronic kidney disease, stage 3b (HCC)   CHF (congestive heart failure), NYHA class III, chronic, diastolic (HCC)   Infection as cause of abscess of colon   COPD (chronic obstructive pulmonary disease) ([place])  Incarcerated ventral hernia: With suspected colonic/appendiceal perforation s/p tube placement by IR 1/11.  - Continue zosyn.  - Definitive therapy per surgery is operative, not medical. To OR 08/11/2020. D/w PCCM team who will likely take over care in ICU postoperatively.   Covid-19 pneumonia: Breakthrough case (s/p Pfizer 5/22, 01/09/2020) with + PCR 1/11 and subtle infiltrates on CXR.  - Continue remdesivir (started 1/11), with no hypoxia above her baseline and  concomitant wound/perforation, stopped solumedrol. Certainly not a candidate for immunomodulators and currently does not require consideration.  Chronic hypoxic respiratory failure due to COPD: Without exacerbation.  - Continue bronchodilators.  - Pulmonary has evaluated the patient.  - Aggressive pulmonary toilet  Chronic HFpEF: G2DD, preserved LVEF.  - Continue metoprolol, bidil.  - Appears euvolemic prior to surgery.  - No further recommendations for optimization or risk stratification per cardiology. 6.6% risk of MACE.   Stage IIIb CKD: SCr near baseline.  - Avoid nephrotoxins.   Left renal mass:  - Urology recommending addressing at a later time.   HLD:  - Pravastatin  Obesity: Estimated body mass index is 37.07 kg/m as calculated from the following:   Height as of this encounter: 5' (1.524 m).   Weight as of this encounter: 86.1 kg.  Gout: No flare - Continue allopurinol  DVT prophylaxis: [place] heparin Code Status: Full Family Communication: None at bedside. Pt declines offer to call family. Disposition Plan:  Status is: Inpatient  Remains inpatient appropriate because:Ongoing diagnostic testing needed not appropriate for outpatient work up and Inpatient level of care appropriate due to severity of illness  Dispo: The patient is from: Home              Anticipated d/c is to: TBD              Anticipated d/c date is: > 3 days              Patient currently is not medically stable to d/c.  Consultants:   IR  Surgery  Cardiology  Pulmonary/CCM  Palliative care  Procedures:   Anterior abdominal wall drain placement 63F pigtail drain 08/09/2020 Dr. Earleen Newport  Antimicrobials:  Zosyn  Remdesivir  Subjective: Has concluded she wishes to attempt surgery but wouldn't want protracted postoperative vent support. Has named surrogate decision makers. Pain is controlled, no fever, no dyspnea, cough, or chest pain.  Objective: Vitals:   08/11/20 0520 08/11/20 0549  08/11/20 0551 08/11/20 0920  BP: (!) 186/50 (!) 178/49 (!) 161/56 (!) 151/39  Pulse: (!) 52 (!) 52 (!) 51 (!) 54  Resp: 18     Temp: 97.8 F (36.6 C)   97.9 F (36.6 C)  TempSrc: Oral   Oral  SpO2: 98%   98%  Weight:      Height:        Intake/Output Summary (Last 24 hours) at 08/11/2020 1202 Last data filed at 08/11/2020 0549 Gross per 24 hour  Intake 161.24 ml  Output 100 ml  Net 61.24 ml   Filed Weights   08/08/20 1413 08/09/20 2329  Weight: 83 kg 86.1 kg   Gen: Elderly obese female in no distress Pulm: Nonlabored breathing 2L O2. Clear, diminished. CV: Regular rate and rhythm. No murmur, rub, or gallop. No JVD, no pitting dependent edema. GI: Abdomen tender with protruding ventral hernia +BS Ext: Warm, no deformities. Lower legs w/chronic stasis changes. Skin: No new rashes, lesions or ulcers on visualized skin. Abdomen appears unchanged with erythema and exquisitely tender. Neuro: Alert and oriented. No focal neurological deficits. Psych: Judgement and insight appear fair. Mood euthymic & affect congruent. Behavior is appropriate.    Data Reviewed: I have personally reviewed following labs and imaging studies  CBC: Recent Labs  Lab 08/08/20 1756 08/09/20 0735 08/10/20 0452 08/11/20 0503  WBC 24.6* 20.6* 19.0* 17.2*  HGB 10.4* 8.6* 8.8* 8.9*  HCT 32.2* 27.3* 28.3* 28.8*  MCV 78.7* 78.7* 79.9* 80.0  PLT 340 291 292 213   Basic Metabolic Panel: Recent Labs  Lab 08/08/20 1756 08/09/20 0735 08/10/20 0452 08/11/20 0503  NA 132* 133* 136 136  K 3.4* 3.5 4.1 3.7  CL 92* 96* 97* 95*  CO2 25 27 27 24   GLUCOSE 110* 104* 125* 122*  BUN 35* 33* 40* 63*  CREATININE 1.39* 1.45*  1.44* 1.65* 2.24*  CALCIUM 9.5 8.6* 9.0 8.8*   GFR: Estimated Creatinine Clearance: 19.5 mL/min (A) (by C-G formula based on SCr of 2.24 mg/dL (H)). Liver Function Tests: Recent Labs  Lab 08/08/20 1756 08/10/20 0452  AST 24 18  ALT 15 14  ALKPHOS 66 51  BILITOT 0.7 0.4  PROT 7.6  6.0*  ALBUMIN 3.2* 2.2*   Recent Labs  Lab 08/08/20 1756  LIPASE 35   No results for input(s): AMMONIA in the last 168 hours. Coagulation Profile: Recent Labs  Lab 08/09/20 0957  INR 1.2   Cardiac Enzymes: No results for input(s): CKTOTAL, CKMB, CKMBINDEX, TROPONINI in the last 168 hours. BNP (last 3 results) No results for input(s): PROBNP in the last 8760 hours. HbA1C: No results for input(s): HGBA1C in the last 72 hours. CBG: No results for input(s): GLUCAP in the last 168 hours. Lipid Profile: No results for input(s): CHOL, HDL, LDLCALC, TRIG, CHOLHDL, LDLDIRECT in the last 72 hours. Thyroid Function Tests: No results for input(s): TSH, T4TOTAL, FREET4, T3FREE, THYROIDAB in the last 72 hours. Anemia Panel: Recent Labs    08/10/20 0452 08/11/20 0503  FERRITIN 198 202   Urine analysis:    Component Value Date/Time   COLORURINE YELLOW 08/09/2020 0700   APPEARANCEUR HAZY (A) 08/09/2020 0700   LABSPEC 1.034 (H) 08/09/2020 0700   PHURINE 5.0 08/09/2020 0700   GLUCOSEU  NEGATIVE 08/09/2020 0700   GLUCOSEU NEGATIVE 11/05/2017 1103   HGBUR NEGATIVE 08/09/2020 0700   HGBUR moderate 02/02/2008 1439   BILIRUBINUR NEGATIVE 08/09/2020 0700   KETONESUR NEGATIVE 08/09/2020 0700   PROTEINUR 30 (A) 08/09/2020 0700   UROBILINOGEN 0.2 11/05/2017 1103   NITRITE NEGATIVE 08/09/2020 0700   LEUKOCYTESUR NEGATIVE 08/09/2020 0700   Recent Results (from the past 240 hour(s))  Resp Panel by RT-PCR (Flu A&B, Covid) Nasopharyngeal Swab     Status: Abnormal   Collection Time: 08/09/20  4:41 AM   Specimen: Nasopharyngeal Swab; Nasopharyngeal(NP) swabs in vial transport medium  Result Value Ref Range Status   SARS Coronavirus 2 by RT PCR POSITIVE (A) NEGATIVE Final    Comment: RESULT CALLED TO, READ BACK BY AND VERIFIED WITH: GARRISON,G. RN AT 7902 08/09/20 MULLINS,T (NOTE) SARS-CoV-2 target nucleic acids are DETECTED.  The SARS-CoV-2 RNA is generally detectable in upper  respiratory specimens during the acute phase of infection. Positive results are indicative of the presence of the identified virus, but do not rule out bacterial infection or co-infection with other pathogens not detected by the test. Clinical correlation with patient history and other diagnostic information is necessary to determine patient infection status. The expected result is Negative.  Fact Sheet for Patients: EntrepreneurPulse.com.au  Fact Sheet for Healthcare Providers: IncredibleEmployment.be  This test is not yet approved or cleared by the [place] FDA and  has been authorized for detection and/or diagnosis of SARS-CoV-2 by FDA under an Emergency Use Authorization (EUA).  This EUA will remain in effect (meaning this tes t can be used) for the duration of  the COVID-19 declaration under Section 564(b)(1) of the Act, 21 U.S.C. section 360bbb-3(b)(1), unless the authorization is terminated or revoked sooner.     Influenza A by PCR NEGATIVE NEGATIVE Final   Influenza B by PCR NEGATIVE NEGATIVE Final    Comment: (NOTE) The Xpert Xpress SARS-CoV-2/FLU/RSV plus assay is intended as an aid in the diagnosis of influenza from Nasopharyngeal swab specimens and should not be used as a sole basis for treatment. Nasal washings and aspirates are unacceptable for Xpert Xpress SARS-CoV-2/FLU/RSV testing.  Fact Sheet for Patients: EntrepreneurPulse.com.au  Fact Sheet for Healthcare Providers: IncredibleEmployment.be  This test is not yet approved or cleared by the [place] FDA and has been authorized for detection and/or diagnosis of SARS-CoV-2 by FDA under an Emergency Use Authorization (EUA). This EUA will remain in effect (meaning this test can be used) for the duration of the COVID-19 declaration under Section 564(b)(1) of the Act, 21 U.S.C. section 360bbb-3(b)(1), unless the authorization is  terminated or revoked.  Performed at [facility], [place] [address], [place], [place] [address]   Aerobic/Anaerobic Culture (surgical/deep wound)     Status: None (Preliminary result)   Collection Time: 08/09/20  3:51 PM   Specimen: Abscess  Result Value Ref Range Status   Specimen Description   Final    ABSCESS Performed at [facility] [address] [place], [place] [address]    Special Requests   Final    NONE Performed at [facility], [place] [address], [place], [place] [address]    Gram Stain   Final    NO WBC SEEN ABUNDANT GRAM POSITIVE COCCI ABUNDANT GRAM NEGATIVE RODS ABUNDANT GRAM POSITIVE RODS ABUNDANT GRAM VARIABLE ROD    Culture   Final    TOO YOUNG TO READ Performed at [facility], [place] [address], [place],  [address]  Report Status PENDING  Incomplete      Radiology Studies: [place] IMAGE GUIDED FLUID DRAIN BY CATHETER  Result Date: 08/09/2020 INDICATION: 80 year old female with a history incarcerated ventral hernia containing colon and appendix, now with abscess, presumably from appendicitis EXAM: ULTRASOUND-GUIDED DRAINAGE OF ABDOMINAL WALL ABSCESS MEDICATIONS: The patient is currently admitted to the hospital and receiving intravenous antibiotics. The antibiotics were administered within an appropriate time frame prior to the initiation of the procedure. ANESTHESIA/SEDATION: Fentanyl 100 mcg IV; Versed 1.0 mg IV Moderate Sedation Time:  12 minutes The patient was continuously monitored during the procedure by the interventional radiology nurse under my direct supervision. COMPLICATIONS: None PROCEDURE: Informed written consent was obtained from the patient after a thorough discussion of the procedural risks, benefits and alternatives. All questions were addressed. Maximal Sterile Barrier Technique was utilized including caps, mask, sterile gowns, sterile gloves, sterile drape, hand hygiene and skin  antiseptic. A timeout was performed prior to the initiation of the procedure. Patient positioned supine position on the ultrasound gantry. Scout images were acquired for planning purposes. The patient is prepped and draped in the usual sterile fashion. 1% lidocaine was used for local anesthesia. Once the patient was anesthetized, small stab incision was made with 11 blade scalpel. Trocar technique was used to place a 12 [place] drain into the abscess with complex fluid in the ventral wall. The 12 French catheter was advanced from the trocar and formed in the abscess. Approximately 150 cc of foul-smelling dark fluid aspirated. Culture was sent. Catheter was sutured in position and a final image was stored. Catheter was attached to bulb suction. Patient tolerated the procedure well and remained hemodynamically stable throughout. No complications were encountered and no significant blood loss. IMPRESSION: Status post ultrasound-guided drainage of ventral wall abscess with sample sent for culture. Signed, Dulcy Fanny. Dellia Nims, RPVI [facility] Electronically Signed   By: Corrie Mckusick D.O.   On: 08/09/2020 16:27    Scheduled Meds: . allopurinol  100 mg Oral Daily  . fluticasone furoate-vilanterol  1 puff Inhalation Daily  . heparin  5,000 Units Subcutaneous Q8H  . isosorbide-hydrALAZINE  1 tablet Oral BID  . metoprolol tartrate  12.5 mg Oral BID  . Pitavastatin Magnesium  2 mg Oral Daily  . sodium chloride flush  5 mL Intracatheter Q8H   Continuous Infusions: . lactated ringers 75 mL/hr at 08/11/20 0138  . piperacillin-tazobactam (ZOSYN)  IV Stopped (08/11/20 1010)  . remdesivir 100 mg in NS 100 mL 100 mg (08/11/20 1024)     LOS: 2 days   Time spent: 35 minutes.  Patrecia Pour, MD [facility] [email or web address] 08/11/2020, 12:02 PM

## 2020-08-11 NOTE — Op Note (Signed)
[name] 08/09/2020 - 08/11/2020   Pre-op Diagnosis: chronically incarcerated ventral hernia with fluid collection     Post-op Diagnosis: Mesenteric Mass, Transverse colon stricture  Procedure(s): EXPLORATORY LAPAROTOMY  RESECTION OF MESENTERIC MASS PARTIAL COLECTOMY  END COLOSTOMY  Surgeon(s): Coralie Keens, MD  Anesthesia: General  Staff:  Circulator: Tamala Julian, RN Scrub Person: Loney Hering Circulator Assistant: Jamas Lav, RN               Specimens: sent to path  Indications: This is an 80 year old female with a known incarcerated chronic ventral hernia. She presented with a large fluid collection consistent with an abscess in the hernia sac of uncertain etiology. A drain was placed into this which had an appearance consistent with stool. She was developing extensive cellulitis to the abdominal wall. The decision was made to proceed to the operating room for exploration  Findings: The patient was found to have a chronically incarcerated hernia containing transverse colon. There was a large mass of uncertain etiology in the mesentery of the colon. It was difficult to tell whether this was a necrotic lymph node or a large abscess with a thick wall. There was also a stricture of the colon distal to this secondary to the hernia  Procedure: The patient was brought to operating identifies correct patient. She was placed upon the operating table and general anesthesia was induced. Her abdomen was prepped and draped in usual sterile fashion. We made a longitudinal elliptical incision just to the right of the umbilicus around the area of necrotic skin. We then excised the skin with the cautery. We then dissected down to the large hernia sac. Made incision larger superior and inferiorly and remove the umbilicus. She had a large hernia sac with a small fascial opening. The sac was quite thick walled. We opened the sac exposing the transverse colon. Adjacent to the  colon the mesentery was a large mass of uncertain etiology. This seemed to separate from the colon and we excised this separately. It was difficult to tell whether this was an abscess with a very thick walled or necrotic lymph node. We then excised all of the redundant hernia sac. The transverse colon was followed to where it reentered the abdominal cavity. It was very strictured from the hernia. We elected to transect this area with a GIA 75 stapler. We then resected the transverse colon that was involved with this mesenteric mass. We transected the colon proximal to the mass area with a GIA-75 stapler as well. We then took down the mesentery with the LigaSure. At this point, the transverse colon, omentum, and the mass were sent to pathology for evaluation. We then made a separate skin incision in the patient's left mid abdomen with the cautery and created an area for the colostomy. We took this down to the fascia which was then opened in a cruciate fashion. We then separated the underlying muscle was then opened the peritoneum. The transverse colon was pulled out of this as the colostomy. We then closed the patient's midline fascia with single armed #1 PDS suture. The ostomy was then opened the staple line and it was matured circumferentially with 3-0 Vicryl sutures. We then irrigated the large abdominal wound with saline. Hemostasis peer to be achieved. We then packed the wound with wet-to-dry saline soaked Kerlix. Dry gauze was then placed over this. An ostomy appliance was then applied. The patient appeared to tolerate procedure well. All the counts were correct at the  end of the procedure. Attempt will be made by anesthesiology to extubate the patient.          Coralie Keens   Date: 08/11/2020  Time: 2:02 PM

## 2020-08-11 NOTE — Anesthesia Postprocedure Evaluation (Signed)
Anesthesia Post Note  Patient: [name]  Procedure(s) Performed: EXPLORATORY LAPAROTOMY PARTIAL COLECTOMY AND COLOSTOMY WITH RESECTION OF A MESSENTERIC MASS (N/A Abdomen)     Patient location during evaluation: PACU Anesthesia Type: General Level of consciousness: awake and alert Pain management: pain level controlled Vital Signs Assessment: post-procedure vital signs reviewed and stable Respiratory status: spontaneous breathing, nonlabored ventilation and respiratory function stable Cardiovascular status: blood pressure returned to baseline and stable Postop Assessment: no apparent nausea or vomiting Anesthetic complications: no   No complications documented.  Last Vitals:  Vitals:   08/11/20 1515 08/11/20 1530  BP: (!) 110/51 (!) 101/45  Pulse: 62 63  Resp: 10 12  Temp: (!) 34.9 C (!) 34.8 C  SpO2: 100% 100%    Last Pain:  Vitals:   08/11/20 1530  TempSrc:   PainSc: Asleep                 Lynda Rainwater

## 2020-08-11 NOTE — Transfer of Care (Signed)
Immediate Anesthesia Transfer of Care Note  Patient: [name]  Procedure(s) Performed: EXPLORATORY LAPAROTOMY PARTIAL COLECTOMY AND COLOSTOMY WITH RESECTION OF A MESSENTERIC MASS (N/A Abdomen)  Patient Location: ICU  Anesthesia Type:General  Level of Consciousness: awake and patient cooperative  Airway & Oxygen Therapy: Patient Spontanous Breathing and Patient connected to face mask  Post-op Assessment: Report given to RN and Post -op Vital signs reviewed and stable  Post vital signs: Reviewed and stable  Last Vitals:  Vitals Value Taken Time  BP    Temp    Pulse    Resp    SpO2      Last Pain:  Vitals:   08/11/20 0951  TempSrc:   PainSc: 3       Patients Stated Pain Goal: 2 (29/24/46 2863)  Complications: No complications documented.

## 2020-08-11 NOTE — Progress Notes (Signed)
Subjective: CC: Patient reports continued severe pain over the hernia. No n/v. Denies flatus or BM. Discussed with palliative yesterday and family and would like to proceed with OR.  She notes her SOB has improved from yesterday. No cough or CP.  Objective: Vital signs in last 24 hours: Temp:  [97.7 F (36.5 C)-97.9 F (36.6 C)] 97.8 F (36.6 C) (01/13 0520) Pulse Rate:  [51-55] 51 (01/13 0551) Resp:  [16-18] 18 (01/13 0520) BP: (126-186)/(43-67) 161/56 (01/13 0551) SpO2:  [97 %-100 %] 98 % (01/13 0520) Last BM Date: 08/04/20  Intake/Output from previous day: 01/12 0701 - 01/13 0700 In: 161.2 [IV Piggyback:161.2] Out: 100 [Drains:100] Intake/Output this shift: No intake/output data recorded.  PE: Gen: elderly, chronicly ill appearing female Heart: reg rate Lungs: distant at bases. Clear upper field b/l. Normal rate and effort. On O2 Abd: Patient very tender to touch over ventral hernia and lower right abdomen. She has noted redness and heat over lower abdominal wall with some blistering containing dark fluid as noted in the picture from yesterday. Hernia is not reducible and is tense. Drain with milky brown fluid that smells of stool.  Msk: Chronic venous stasis changes of the LE's b/l. No edema.   Lab Results:  Recent Labs    08/09/20 0735 08/10/20 0452  WBC 20.6* 19.0*  HGB 8.6* 8.8*  HCT 27.3* 28.3*  PLT 291 292   BMET Recent Labs    08/09/20 0735 08/10/20 0452  NA 133* 136  K 3.5 4.1  CL 96* 97*  CO2 27 27  GLUCOSE 104* 125*  BUN 33* 40*  CREATININE 1.45*  1.44* 1.65*  CALCIUM 8.6* 9.0   PT/INR Recent Labs    08/09/20 0957  LABPROT 14.4  INR 1.2   CMP     Component Value Date/Time   NA 136 08/10/2020 0452   NA 142 04/11/2016 1007   K 4.1 08/10/2020 0452   K 3.3 (L) 04/11/2016 1007   CL 97 (L) 08/10/2020 0452   CO2 27 08/10/2020 0452   CO2 31 (H) 04/11/2016 1007   GLUCOSE 125 (H) 08/10/2020 0452   GLUCOSE 78 04/11/2016 1007    BUN 40 (H) 08/10/2020 0452   BUN 24.4 04/11/2016 1007   CREATININE 1.65 (H) 08/10/2020 0452   CREATININE 1.36 (H) 02/04/2020 1443   CREATININE 1.1 04/11/2016 1007   CALCIUM 9.0 08/10/2020 0452   CALCIUM 9.8 04/11/2016 1007   PROT 6.0 (L) 08/10/2020 0452   PROT 6.9 04/11/2016 1007   PROT 7.5 04/11/2016 1007   ALBUMIN 2.2 (L) 08/10/2020 0452   ALBUMIN 3.0 (L) 04/11/2016 1007   AST 18 08/10/2020 0452   AST 12 04/11/2016 1007   ALT 14 08/10/2020 0452   ALT 12 04/11/2016 1007   ALKPHOS 51 08/10/2020 0452   ALKPHOS 82 04/11/2016 1007   BILITOT 0.4 08/10/2020 0452   BILITOT 0.35 04/11/2016 1007   GFRNONAA 31 (L) 08/10/2020 0452   GFRAA 30 (L) 02/19/2020 1359   Lipase     Component Value Date/Time   LIPASE 35 08/08/2020 1756       Studies/Results: DG CHEST PORT 1 VIEW  Result Date: 08/09/2020 CLINICAL DATA:  80 year old female with history of COVID infection with increasing shortness of breath. EXAM: PORTABLE CHEST 1 VIEW COMPARISON:  Chest x-ray 01/01/2018. FINDINGS: Ill-defined opacities and areas of mild interstitial prominence are noted throughout the mid to lower lungs bilaterally. Small left pleural effusion or chronic pleuroparenchymal scarring, stable compared  to remote prior study from 2019. No pneumothorax. No evidence of pulmonary edema. Heart size is mildly enlarged. Upper mediastinal contours are within normal limits. Aortic atherosclerosis. IMPRESSION: 1. There are subtle findings in the mid to lower lungs which suggest developing multilobar bilateral pneumonia, compatible with reported COVID infection. 2. Mild cardiomegaly. 3. Aortic atherosclerosis. Electronically Signed   By: Vinnie Langton M.D.   On: 08/09/2020 09:14   [place] IMAGE GUIDED FLUID DRAIN BY CATHETER  Result Date: 08/09/2020 INDICATION: 80 year old female with a history incarcerated ventral hernia containing colon and appendix, now with abscess, presumably from appendicitis EXAM: ULTRASOUND-GUIDED DRAINAGE  OF ABDOMINAL WALL ABSCESS MEDICATIONS: The patient is currently admitted to the hospital and receiving intravenous antibiotics. The antibiotics were administered within an appropriate time frame prior to the initiation of the procedure. ANESTHESIA/SEDATION: Fentanyl 100 mcg IV; Versed 1.0 mg IV Moderate Sedation Time:  12 minutes The patient was continuously monitored during the procedure by the interventional radiology nurse under my direct supervision. COMPLICATIONS: None PROCEDURE: Informed written consent was obtained from the patient after a thorough discussion of the procedural risks, benefits and alternatives. All questions were addressed. Maximal Sterile Barrier Technique was utilized including caps, mask, sterile gowns, sterile gloves, sterile drape, hand hygiene and skin antiseptic. A timeout was performed prior to the initiation of the procedure. Patient positioned supine position on the ultrasound gantry. Scout images were acquired for planning purposes. The patient is prepped and draped in the usual sterile fashion. 1% lidocaine was used for local anesthesia. Once the patient was anesthetized, small stab incision was made with 11 blade scalpel. Trocar technique was used to place a 12 [place] drain into the abscess with complex fluid in the ventral wall. The 12 French catheter was advanced from the trocar and formed in the abscess. Approximately 150 cc of foul-smelling dark fluid aspirated. Culture was sent. Catheter was sutured in position and a final image was stored. Catheter was attached to bulb suction. Patient tolerated the procedure well and remained hemodynamically stable throughout. No complications were encountered and no significant blood loss. IMPRESSION: Status post ultrasound-guided drainage of ventral wall abscess with sample sent for culture. Signed, Dulcy Fanny. Dellia Nims, RPVI [facility] Electronically Signed   By: Corrie Mckusick  D.O.   On: 08/09/2020 16:27    Anti-infectives: Anti-infectives (From admission, onward)   Start     Dose/Rate Route Frequency Ordered Stop   08/10/20 1000  remdesivir 100 mg in sodium chloride 0.9 % 100 mL IVPB       "Followed by" Linked Group Details   100 mg 200 mL/hr over 30 Minutes Intravenous Daily 08/09/20 1045 08/14/20 0959   08/09/20 1200  remdesivir 200 mg in sodium chloride 0.9% 250 mL IVPB       "Followed by" Linked Group Details   200 mg 580 mL/hr over 30 Minutes Intravenous Once 08/09/20 1045 08/09/20 1430   08/09/20 0745  piperacillin-tazobactam (ZOSYN) IVPB 3.375 g        3.375 g 12.5 mL/hr over 240 Minutes Intravenous Every 8 hours 08/09/20 0735     08/09/20 0315  piperacillin-tazobactam (ZOSYN) IVPB 3.375 g        3.375 g 100 mL/hr over 30 Minutes Intravenous  Once 08/09/20 0306 08/09/20 0515       Assessment/Plan H/O Ulcerative colitis  HTN A fib- not on anticoagulation Cor pulmonale Chronic combined CHF - last ECHO inOct 2021with EF 60-65% PVD COPD/O2 dependence HLD CKDstage III MGUS  OSA Obesity- BMI 35.74 Gout  COVID positive- per primary, patient has received 2 doses of vaccine but has not had booster yet. On Remdesivir per primary for COVID PNA  3.5 cm R renal mass- concern for renal cell carcinoma. Urology, Dr. Tresa Moore, has seen and suspects likely non-metastatic slowly growing renal cancer. Believes this is very unlikely to become clinically significant at her level of comorbidity. Additionally, curative therapy would have high probability of pushing her to [facility] she adamantly refuses dialysis.He recommend watchful waiting with consideration of systemic palliative therapy should symptomatic / metastatic disease ever develop, which he believes is very unlikely. He does NOT recommend any concomitant kidney surgery should she have to have heroic intra-abdominal surgery for GI/hernia indications.   Chronically incarcerated ventral hernia   Fluid collection in hernia sac - CT 1/11 with large ventral hernia containing transverse colon, similar to previous with new air/fluid collection within there hernia - s/p IR drainage 1/11. Cx's pending.  - IR drain appears to be with purulent brown/feculant fluid that I am concerned is from an underlying perforation in hernia that caused the fluid collection to form.  This could be from perforated appendicitis, diverticulitis or another etiology.  I am also concerned about impending skin breakdown over inferior hernia sac. - I recommended that we take the patient to the operating room for exploratory laparotomy and likely drainage of fluid collection, possible bowel resection, possible creation of a colostomy and possible primary repair of hernia.  We discussed without operative intervention I am concerned that she will worsen and this could lead to escalation of care and possible death. Patient is a high risk surgical candidate and has been seen by [facility] and Cardiology yesterday for risk stratification. We discussed that cardiology reports a 6.6% risk of major cardiac event.  We discussed that pulmonology reports she is very high risk for prolonged mechanical ventilation after surgery and has a 10% risk of postop pneumonia. Patient met with Palliative medicine on 1/12 and discussed with family. She states she would like to proceed with the case. We discussed the planned procedure. We discussed risks including to but not limited to: (above risks outlined by cardiology and pulm), general anesthesia (MI, CVA, death, prolonged intubation) pain, bleeding, infection, damage to surrounding structures (blood vessels/nerves/viscus/organs/ureter). We discussed the possibility of requiring an ostomy. We discussed the possible need for additional procedures. We discussed that despite operative intervention, her condition may continue to decline that could lead to her death. Patient is A&O x 4 and voiced understanding and  elected to proceed with surgery. She asked to speak with Palliative medicine before surgery. I have discussed with Dr. Domingo Cocking who will see the patient before OR. I appreciate his assistance with this case. I have reached out to the primary team to make them aware she is moving forward with surgery. Will likely need recovery in ICU with pulmonary consultation as mentioned by CCM on 1/11.  FEN: Strict NPO, IVF per primary  VTE: SCDs, SQ heparin  ID: Zosyn 1/11>> Cx's pending. IR cx w/ Gram + cocci and rods, gram neg rods and gram variable rods.     LOS: 2 days    Jillyn Ledger , [facility] 08/11/2020, 8:05 AM Please see Amion for pager number during day hours 7:00am-4:30pm

## 2020-08-11 NOTE — Anesthesia Procedure Notes (Signed)
Procedure Name: Intubation Date/Time: 08/11/2020 12:41 PM Performed by: Claudia Desanctis, CRNA Pre-anesthesia Checklist: Patient identified, Emergency Drugs available, Suction available and Patient being monitored Patient Re-evaluated:Patient Re-evaluated prior to induction Oxygen Delivery Method: Circle system utilized Preoxygenation: Pre-oxygenation with 100% oxygen Induction Type: IV induction and Rapid sequence Laryngoscope Size: 2 and Miller Grade View: Grade I Tube type: Oral Tube size: 7.0 mm Number of attempts: 1 Airway Equipment and Method: Stylet Placement Confirmation: ETT inserted through vocal cords under direct vision,  positive ETCO2 and breath sounds checked- equal and bilateral Secured at: 20 cm Tube secured with: Tape Dental Injury: Teeth and Oropharynx as per pre-operative assessment

## 2020-08-11 NOTE — Anesthesia Preprocedure Evaluation (Signed)
Anesthesia Evaluation  Patient identified by MRN, date of birth, ID band Patient awake    Reviewed: Allergy & Precautions, NPO status , Patient's Chart, lab work & pertinent test results  Airway Mallampati: II  TM Distance: >3 FB Neck ROM: Full    Dental  (+) Dental Advisory Given, Upper Dentures   Pulmonary asthma , sleep apnea , COPD, former smoker,    breath sounds clear to auscultation       Cardiovascular hypertension, +CHF  + dysrhythmias Atrial Fibrillation + Valvular Problems/Murmurs  Rhythm:Regular Rate:Normal     Neuro/Psych negative neurological ROS  negative psych ROS   GI/Hepatic Neg liver ROS, PUD, GERD  ,  Endo/Other  negative endocrine ROS  Renal/GU Renal disease     Musculoskeletal  (+) Arthritis ,   Abdominal Normal abdominal exam  (+) + obese,   Peds  Hematology negative hematology ROS (+)   Anesthesia Other Findings Covid +  Reproductive/Obstetrics                             Anesthesia Physical  Anesthesia Plan  ASA: IV  Anesthesia Plan: General   Post-op Pain Management:    Induction: Intravenous  PONV Risk Score and Plan: 3 and Ondansetron, Dexamethasone, Midazolam and Treatment may vary due to age or medical condition  Airway Management Planned: Oral ETT  Additional Equipment: None  Intra-op Plan:   Post-operative Plan: Possible Post-op intubation/ventilation  Informed Consent: I have reviewed the patients History and Physical, chart, labs and discussed the procedure including the risks, benefits and alternatives for the proposed anesthesia with the patient or authorized representative who has indicated his/her understanding and acceptance.       Plan Discussed with: CRNA  Anesthesia Plan Comments:         Anesthesia Quick Evaluation

## 2020-08-11 NOTE — Consult Note (Signed)
NAME:  [name], MRN:  [id number], DOB:  03-20-1941, LOS: 2 ADMISSION DATE:  08/09/2020, CONSULTATION DATE: 08/09/20  REFERRING MD:  Dr. Ninfa Linden , CHIEF COMPLAINT: Abdominal Pain   Brief History:  80 y/o F with known incarcerated ventral hernia admitted 1/11 to [facility] with 3 weeks of abdominal pain.  Work up found her to have a fluid collection in known ventral hernia sac and a 3.5 cm right renal mass worrisome for malignancy.  She was also found to be COVID positive. IR placed a drain into the abdominal wall abscess which had the appearance of stool.  She was taken to OR 1/13 for ex-lap with findings of mesenteric mass s/p resection and partial colectomy.   Past Medical History:  B12 Anemia  Ulcerative Colitis - left side  Morbid Obesity  Incarcerated Ventral Hernia  Pre Diabetes  MGUS  HTN  HLD  HFpEF - chronic combined class III  Atrial Fibrillation  Cor Pulmonale  COPD  Oxygen Dependent Respiratory Failure  OSA  Kidney Stones  CKD III  Significant Hospital Events:  1/11 Admit with abdominal pain in setting of new fluid collection in ventral hernia, COVID positive  1/13 OR for ex-lap, resection of mesenteric mass, partial colectomy   Consults:  Cardiology  CCS PCCM   Procedures:    Significant Diagnostic Tests:   CT ABD / Pelvis 1/11 >> large ventral wall hernia containing a loop of the transverse colon. No evidence of obstruction but there is a new large air and fluid collection within the hernia sac concerning for perforation of the transverse colon with abscess or complicated diverticulitis or complicated appendicitis. 3.5 CM right renal mass consistent with renal cell carcinoma. Splenomegaly.   Micro Data:  COVID 1/11 >> positive Influenza A/B 1/11 >> negative  Abdominal Wall Abscess 1/11 >>   Antimicrobials:  Remdesivir 1/11 >>  Zosyn 1/11 >>   Interim History / Subjective:  Afebrile / WBC 17.2  Pre-op on 2L/[place], returned post op on 2L  Glucose range  120's  EBL from surgery 150 ml Pt s/p dilaudid in PACU for pain   Objective   Blood pressure (!) 151/39, pulse (!) 54, temperature 97.9 F (36.6 C), temperature source Oral, resp. rate 18, height 5' (1.524 m), weight 86.1 kg, SpO2 98 %.        Intake/Output Summary (Last 24 hours) at 08/11/2020 1413 Last data filed at 08/11/2020 1357 Gross per 24 hour  Intake 1161.24 ml  Output 250 ml  Net 911.24 ml   Filed Weights   08/08/20 1413 08/09/20 2329  Weight: 83 kg 86.1 kg    Examination: see MD exam for full exam  General: elderly adult female lying in bed in NAD HEENT: MM pink/moist, [place] O2  Neuro: sleeping comfortably in bed, RN reports she wakes / alert & oriented  CV: SR with wide QRS on monitor, frequent PVC's noted  PULM: non-labored at rest, no accessory muscle use   LABS   PULMONARY No results for input(s): PHART, PCO2ART, PO2ART, HCO3, TCO2, O2SAT in the last 168 hours.  Invalid input(s): PCO2, PO2  CBC Recent Labs  Lab 08/09/20 0735 08/10/20 0452 08/11/20 0503  HGB 8.6* 8.8* 8.9*  HCT 27.3* 28.3* 28.8*  WBC 20.6* 19.0* 17.2*  PLT 291 292 339   COAGULATION Recent Labs  Lab 08/09/20 0957  INR 1.2   CARDIAC  No results for input(s): TROPONINI in the last 168 hours. No results for input(s): PROBNP in the last 168  hours.  CHEMISTRY Recent Labs  Lab 08/08/20 1756 08/09/20 0735 08/10/20 0452 08/11/20 0503  NA 132* 133* 136 136  K 3.4* 3.5 4.1 3.7  CL 92* 96* 97* 95*  CO2 25 27 27 24   GLUCOSE 110* 104* 125* 122*  BUN 35* 33* 40* 63*  CREATININE 1.39* 1.45*  1.44* 1.65* 2.24*  CALCIUM 9.5 8.6* 9.0 8.8*   Estimated Creatinine Clearance: 19.5 mL/min (A) (by C-G formula based on SCr of 2.24 mg/dL (H)).  LIVER Recent Labs  Lab 08/08/20 1756 08/09/20 0957 08/10/20 0452  AST 24  --  18  ALT 15  --  14  ALKPHOS 66  --  51  BILITOT 0.7  --  0.4  PROT 7.6  --  6.0*  ALBUMIN 3.2*  --  2.2*  INR  --  1.2  --    INFECTIOUS Recent Labs  Lab  08/09/20 0122 08/09/20 0508  LATICACIDVEN 1.1 0.7   ENDOCRINE CBG (last 3)  No results for input(s): GLUCAP in the last 72 hours.    IMAGING [place] IMAGE GUIDED FLUID DRAIN BY CATHETER  Result Date: 08/09/2020 INDICATION: 80 year old female with a history incarcerated ventral hernia containing colon and appendix, now with abscess, presumably from appendicitis EXAM: ULTRASOUND-GUIDED DRAINAGE OF ABDOMINAL WALL ABSCESS MEDICATIONS: The patient is currently admitted to the hospital and receiving intravenous antibiotics. The antibiotics were administered within an appropriate time frame prior to the initiation of the procedure. ANESTHESIA/SEDATION: Fentanyl 100 mcg IV; Versed 1.0 mg IV Moderate Sedation Time:  12 minutes The patient was continuously monitored during the procedure by the interventional radiology nurse under my direct supervision. COMPLICATIONS: None PROCEDURE: Informed written consent was obtained from the patient after a thorough discussion of the procedural risks, benefits and alternatives. All questions were addressed. Maximal Sterile Barrier Technique was utilized including caps, mask, sterile gowns, sterile gloves, sterile drape, hand hygiene and skin antiseptic. A timeout was performed prior to the initiation of the procedure. Patient positioned supine position on the ultrasound gantry. Scout images were acquired for planning purposes. The patient is prepped and draped in the usual sterile fashion. 1% lidocaine was used for local anesthesia. Once the patient was anesthetized, small stab incision was made with 11 blade scalpel. Trocar technique was used to place a 12 [place] drain into the abscess with complex fluid in the ventral wall. The 12 French catheter was advanced from the trocar and formed in the abscess. Approximately 150 cc of foul-smelling dark fluid aspirated. Culture was sent. Catheter was sutured in position and a final image was stored. Catheter was attached to bulb suction.  Patient tolerated the procedure well and remained hemodynamically stable throughout. No complications were encountered and no significant blood loss. IMPRESSION: Status post ultrasound-guided drainage of ventral wall abscess with sample sent for culture. Signed, Dulcy Fanny. Dellia Nims, RPVI [facility] Electronically Signed   By: Corrie Mckusick D.O.   On: 08/09/2020 16:27     Resolved Hospital Problem list      Assessment & Plan:   Incarcerated Ventral Hernia with Fluid Collection Mesenteric Mass Admitted with abdominal pain in setting of known ventral hernia.  CT imaging with concern for abscess s/p IR drain placement - fluid concerning for stool. To OR 1/13 for ex-lap, removal of mesenteric mass (not seen on CT) and colostomy.  -post operative / wound care per CCS -colostomy care per CCS / [place] RN  -follow abdominal exam  -NPO, defer timing of nutrition  to GI -IVF at 82mhr -follow up pathology from surgical biopsy   COVID PNA Breakthrough COVID, s/p Pfizer in May / June 2021.  -continue remdesivir -monitor respiratory exam closely   Chronic Hypoxic Respiratory Failure  OSA  Hx Cor Pulmonale  Without acute exacerbation  -continue Breo  Right Renal Mass  3.5 cm on CT, worrisome for malignancy  -will need biopsy of mass  Chronic HFpEF Grade II Diastolic Dysfunction  -continue metoprolol, bidil in am, hold evening doses with pain medication effects on BP -tele monitoring  -goal for even fluid balance   Stage IIIb CKD  Baseline sr cr ~ 1.35-1.6 -Trend BMP / urinary output -Replace electrolytes as indicated -Avoid nephrotoxic agents, ensure adequate renal perfusion  HLD -continue pravastatin   Obesity  BMI 37 -supportive care   MGUS -outpatient follow up   Best practice (evaluated daily)  Diet: NPO  Pain/Anxiety/Delirium protocol (if indicated): dilaudid  VAP protocol (if indicated): n/a  DVT prophylaxis:  heparin sq GI prophylaxis: n/a  Glucose control: per primary  Mobility: as tolerated  Disposition: monitor in ICU / SDU overnight  Goals of Care:  Last date of multidisciplinary goals of care discussion: per palliative care  Family and staff present:  Summary of discussion:  Follow up goals of care discussion due:  Code Status: Full Code   Labs   CBC: Recent Labs  Lab 08/08/20 1756 08/09/20 0735 08/10/20 0452 08/11/20 0503  WBC 24.6* 20.6* 19.0* 17.2*  HGB 10.4* 8.6* 8.8* 8.9*  HCT 32.2* 27.3* 28.3* 28.8*  MCV 78.7* 78.7* 79.9* 80.0  PLT 340 291 292 3161   Basic Metabolic Panel: Recent Labs  Lab 08/08/20 1756 08/09/20 0735 08/10/20 0452 08/11/20 0503  NA 132* 133* 136 136  K 3.4* 3.5 4.1 3.7  CL 92* 96* 97* 95*  CO2 25 27 27 24   GLUCOSE 110* 104* 125* 122*  BUN 35* 33* 40* 63*  CREATININE 1.39* 1.45*  1.44* 1.65* 2.24*  CALCIUM 9.5 8.6* 9.0 8.8*   GFR: Estimated Creatinine Clearance: 19.5 mL/min (A) (by C-G formula based on SCr of 2.24 mg/dL (H)). Recent Labs  Lab 08/08/20 1756 08/09/20 0122 08/09/20 0508 08/09/20 0735 08/10/20 0452 08/11/20 0503  WBC 24.6*  --   --  20.6* 19.0* 17.2*  LATICACIDVEN  --  1.1 0.7  --   --   --     Liver Function Tests: Recent Labs  Lab 08/08/20 1756 08/10/20 0452  AST 24 18  ALT 15 14  ALKPHOS 66 51  BILITOT 0.7 0.4  PROT 7.6 6.0*  ALBUMIN 3.2* 2.2*   Recent Labs  Lab 08/08/20 1756  LIPASE 35   No results for input(s): AMMONIA in the last 168 hours.  ABG    Component Value Date/Time   PHART 7.368 11/20/2014 1453   PCO2ART 70.3 (HH) 11/20/2014 1453   PO2ART 83.6 11/20/2014 1453   HCO3 39.5 (H) 11/20/2014 1453   TCO2 37.1 11/20/2014 1453   O2SAT 95.4 11/20/2014 1453     Coagulation Profile: Recent Labs  Lab 08/09/20 0957  INR 1.2    Cardiac Enzymes: No results for input(s): CKTOTAL, CKMB, CKMBINDEX, TROPONINI in the last 168 hours.  HbA1C: Hgb A1c MFr Bld  Date/Time Value Ref Range  Status  11/05/2017 11:03 AM 5.4 4.6 - 6.5 % Final    Comment:    Glycemic Control Guidelines for People with Diabetes:Non Diabetic:  <6%Goal of Therapy: <7%Additional Action Suggested:  >8%   08/19/2017 03:01 PM 5.7  4.6 - 6.5 % Final    Comment:    Glycemic Control Guidelines for People with Diabetes:Non Diabetic:  <6%Goal of Therapy: <7%Additional Action Suggested:  >8%     CBG: No results for input(s): GLUCAP in the last 168 hours.    Critical care time:      Noe Gens, MSN, NP-C, AGACNP-BC [facility] 08/11/2020, 2:33 PM   Please see [email or web address] for pager details.

## 2020-08-12 ENCOUNTER — Inpatient Hospital Stay (HOSPITAL_COMMUNITY): Payer: Medicare Other

## 2020-08-12 ENCOUNTER — Encounter (HOSPITAL_COMMUNITY): Payer: Self-pay | Admitting: Surgery

## 2020-08-12 DIAGNOSIS — U071 COVID-19: Secondary | ICD-10-CM | POA: Diagnosis not present

## 2020-08-12 DIAGNOSIS — R579 Shock, unspecified: Secondary | ICD-10-CM | POA: Diagnosis not present

## 2020-08-12 DIAGNOSIS — Z7189 Other specified counseling: Secondary | ICD-10-CM | POA: Diagnosis not present

## 2020-08-12 DIAGNOSIS — N179 Acute kidney failure, unspecified: Secondary | ICD-10-CM | POA: Diagnosis not present

## 2020-08-12 DIAGNOSIS — K651 Peritoneal abscess: Secondary | ICD-10-CM | POA: Diagnosis not present

## 2020-08-12 DIAGNOSIS — K436 Other and unspecified ventral hernia with obstruction, without gangrene: Secondary | ICD-10-CM | POA: Diagnosis not present

## 2020-08-12 LAB — CBC
HCT: 24.4 % — ABNORMAL LOW (ref 36.0–46.0)
Hemoglobin: 7.5 g/dL — ABNORMAL LOW (ref 12.0–15.0)
MCH: 25.1 pg — ABNORMAL LOW (ref 26.0–34.0)
MCHC: 30.7 g/dL (ref 30.0–36.0)
MCV: 81.6 fL (ref 80.0–100.0)
Platelets: 476 10*3/uL — ABNORMAL HIGH (ref 150–400)
RBC: 2.99 MIL/uL — ABNORMAL LOW (ref 3.87–5.11)
RDW: 15.2 % (ref 11.5–15.5)
WBC: 39.4 10*3/uL — ABNORMAL HIGH (ref 4.0–10.5)
nRBC: 0 % (ref 0.0–0.2)

## 2020-08-12 LAB — BASIC METABOLIC PANEL
Anion gap: 13 (ref 5–15)
BUN: 62 mg/dL — ABNORMAL HIGH (ref 8–23)
CO2: 22 mmol/L (ref 22–32)
Calcium: 8.4 mg/dL — ABNORMAL LOW (ref 8.9–10.3)
Chloride: 103 mmol/L (ref 98–111)
Creatinine, Ser: 2.67 mg/dL — ABNORMAL HIGH (ref 0.44–1.00)
GFR, Estimated: 18 mL/min — ABNORMAL LOW (ref >60)
Glucose, Bld: 206 mg/dL — ABNORMAL HIGH (ref 70–99)
Potassium: 3.8 mmol/L (ref 3.5–5.1)
Sodium: 138 mmol/L (ref 135–145)

## 2020-08-12 LAB — MAGNESIUM: Magnesium: 2.1 mg/dL (ref 1.7–2.4)

## 2020-08-12 LAB — C-REACTIVE PROTEIN: CRP: 9.4 mg/dL — ABNORMAL HIGH (ref <1.0)

## 2020-08-12 LAB — FERRITIN: Ferritin: 149 ng/mL (ref 11–307)

## 2020-08-12 LAB — LACTIC ACID, PLASMA: Lactic Acid, Venous: 1.6 mmol/L (ref 0.5–1.9)

## 2020-08-12 LAB — PHOSPHORUS: Phosphorus: 4.9 mg/dL — ABNORMAL HIGH (ref 2.5–4.6)

## 2020-08-12 LAB — D-DIMER, QUANTITATIVE: D-Dimer, Quant: 2.85 ug/mL-FEU — ABNORMAL HIGH (ref 0.00–0.50)

## 2020-08-12 MED ORDER — FUROSEMIDE 10 MG/ML IJ SOLN
20.0000 mg | Freq: Once | INTRAMUSCULAR | Status: AC
  Administered 2020-08-12: 20 mg via INTRAVENOUS
  Filled 2020-08-12: qty 2

## 2020-08-12 MED ORDER — PIPERACILLIN-TAZOBACTAM IN DEX 2-0.25 GM/50ML IV SOLN
2.2500 g | Freq: Four times a day (QID) | INTRAVENOUS | Status: DC
  Administered 2020-08-12 – 2020-08-15 (×11): 2.25 g via INTRAVENOUS
  Filled 2020-08-12 (×13): qty 50

## 2020-08-12 MED ORDER — MIDODRINE HCL 5 MG PO TABS
2.5000 mg | ORAL_TABLET | Freq: Three times a day (TID) | ORAL | Status: DC
  Administered 2020-08-12 – 2020-08-13 (×3): 2.5 mg via ORAL
  Filled 2020-08-12 (×3): qty 1

## 2020-08-12 MED ORDER — SODIUM CHLORIDE 0.9 % IV SOLN: INTRAVENOUS | Status: DC

## 2020-08-12 NOTE — Consult Note (Signed)
NAME:  [name], MRN:  [id number], DOB:  [birth year], LOS: 3 ADMISSION DATE:  08/09/2020, CONSULTATION DATE: 08/09/20  REFERRING MD:  Dr. Ninfa Linden , CHIEF COMPLAINT: Abdominal Pain   Brief History:  80 y/o F with known incarcerated ventral hernia admitted 1/11 to [facility] with 3 weeks of abdominal pain.  Work up found her to have a fluid collection in known ventral hernia sac and a 3.5 cm right renal mass worrisome for malignancy.  She was also found to be COVID positive. IR placed a drain into the abdominal wall abscess which had the appearance of stool.  She was taken to OR 1/13 for ex-lap with findings of mesenteric mass s/p resection and partial colectomy.   Past Medical History:  B12 Anemia  Ulcerative Colitis - left side  Morbid Obesity  Incarcerated Ventral Hernia  Pre Diabetes  MGUS  HTN  HLD  HFpEF - chronic combined class III  Atrial Fibrillation  Cor Pulmonale  COPD  Oxygen Dependent Respiratory Failure  OSA  Kidney Stones  CKD III  Significant Hospital Events:  1/11 Admit with abdominal pain in setting of new fluid collection in ventral hernia, COVID positive  1/13 OR for ex-lap, resection of mesenteric mass, partial colectomy  - now  Mesenteric Mass, Transverse colon stricture. EXPLORATORY LAPAROTOMY  RESECTION OF MESENTERIC MASS  PARTIAL COLECTOMY   END COLOSTOMY   Consults:  Cardiology  CCS PCCM   Procedures:    Significant Diagnostic Tests:   CT ABD / Pelvis 1/11 >> large ventral wall hernia containing a loop of the transverse colon. No evidence of obstruction but there is a new large air and fluid collection within the hernia sac concerning for perforation of the transverse colon with abscess or complicated diverticulitis or complicated appendicitis. 3.5 CM right renal mass consistent with renal cell carcinoma. Splenomegaly.   Micro Data:  COVID 1/11 >> positive Influenza A/B 1/11 >> negative  Abdominal Wall Abscess 1/11 >>  Path mesentric mass  1/13 -   Antimicrobials:  Remdesivir 1/11 >>  Zosyn 1/11 >>   Interim History / Subjective:    08/12/2020 - 2L [place]. 99% On Neo. On d5 LR.  hypthemric with high WBC - upt 39.4. CCS planning transition to [facility] in a few day.  Woresening creat. ECho oct 2021 with gr 2 ddx/  + 5.7L volume overload. Poor Ur oo   Objective   Blood pressure (!) 138/27, pulse (!) 52, temperature 98 F (36.7 C), temperature source Axillary, resp. rate 10, height 5' (1.524 m), weight 86.1 kg, SpO2 99 %.        Intake/Output Summary (Last 24 hours) at 08/12/2020 0853 Last data filed at 08/12/2020 8588 Gross per 24 hour  Intake 3960.8 ml  Output 1180 ml  Net 2780.8 ml   Filed Weights   08/08/20 1413 08/09/20 2329  Weight: 83 kg 86.1 kg     General Appearance:  Looks deconditiond, Puffy an dObese Head:  Normocephalic, without obvious abnormality, atraumatic Eyes:  PERRL - yes, conjunctiva/corneas - clear     Ears:  Normal external ear canals, both ears Nose:  G tube - no but has [place] Throat:  ETT TUBE - no , OG tube - no Neck:  Supple,  No enlargement/tenderness/nodules Lungs: Clear to auscultation bilaterally, Ventilator   Synchrony - x Heart:  S1 and S2 normal, no murmur, CVP - no.  Pressors - yes on neo Abdomen:  Soft, no masses, no organomegaly Genitalia / Rectal:  Not done  Extremities:  Extremities- intact Skin:  ntact in exposed areas . Sacral area - not examined Neurologic:  Sedation - none -> RASS - +! Marland Kitchen Moves all 4s - yes. CAM-ICU - neg . Orientation - x3+      Resolved Hospital Problem list      Assessment & Plan:   Incarcerated Ventral Hernia with Fluid Collection Mesenteric Mass Admitted with abdominal pain in setting of known ventral hernia.  CT imaging with concern for abscess s/p IR drain placement - fluid concerning for stool. To OR 1/13 for ex-lap, removal of mesenteric mass (not seen on CT) and colostomy.   Plan -post operative / wound care per CCS -colostomy care per CCS /  WOC RN  -follow abdominal exam  -NPO, defer timing of nutrition to GI -follow up pathology from surgical biopsy    Volume overload  (gr 2 ddx in oct 2021)  08/12/2020= - +6L   Plan Lasix despite bein on pressrs kvo fluids   Circulatory shock - post op onset 08/11/20  08/12/2020 - coming down on neo  Plan  - MAP goal > 65 - dc hydralazine on MAR   Chronic HFpEF Grade II Diastolic Dysfunction    - 08/12/2020 concern she will flip into chf exacerbation but also hypotensive  Plan  dc imdur hydralazine Do lasix Dc lopresssor   COVID PNA Breakthrough COVID, s/p Pfizer in May / June 2021.   08/12/2020 - remains on baseline 2L [place] -continue remdesivir -monitor respiratory exam closely   Chronic Hypoxic Respiratory Failure  OSA  Hx Cor Pulmonale  Without acute exacerbation   08/12/2020 - at baseline 2L [place]  plan -continue Breo    Right Renal Mass  3.5 cm on CT, worrisome for malignancy  -will need biopsy of mass    Stage IIIb CKD with post op and pre op AKI Baseline sr cr ~ 1.35-1.6   08/12/2020  - worse creat  Plan  - lasix and reasess   - kvo fluids -Trend BMP / urinary output -Replace electrolytes as indicated -Avoid nephrotoxic agents, ensure adequate renal perfusion   Anemia of critical illness  08/12/2020 - no bleeding  Plan  - - PRBC for hgb </= 6.9gm%    - exceptions are   -  if ACS susepcted/confirmed then transfuse for hgb </= 8.0gm%,  or    -  active bleeding with hemodynamic instability, then transfuse regardless of hemoglobin value   At at all times try to transfuse 1 unit prbc as possible with exception of active hemorrhage    HLD -continue pravastatin   Obesity  BMI 37 -supportive care   MGUS -outpatient follow up   Best practice (evaluated daily)  Diet: NPO  Pain/Anxiety/Delirium protocol (if indicated): dilaudid  VAP protocol (if indicated): n/a  DVT prophylaxis: heparin sq GI prophylaxis: n/a  Glucose  control: per primary  Mobility: as tolerated  Disposition: monitor in ICU / SDU overnight  Goals of Care:  Last date of multidisciplinary goals of care discussion: per palliative care 08/10/20 Family and staff present:  Summary of discussion: no long term vent. Husband with demeina. One daughter disable. 2 children ([name] and [employer]) to make decisions on her behalf.   Follow up goals of care discussion due:   Code Status: Full Code    CCM called Daughter Linton Ham [phone] but went to VM. [place] son Cletis Muma - [phone] - went to VM. Dean  The patient [name] is critically ill with multiple organ systems failure and requires high complexity decision making for assessment and support, frequent evaluation and titration of therapies, application of advanced monitoring technologies and extensive interpretation of multiple databases.   Critical Care Time devoted to patient care services described in this note is  45  Minutes. This time reflects time of care of this signee Dr Brand Males. This critical care time does not reflect procedure time, or teaching time or supervisory time of PA/NP/Med student/Med Resident etc but could involve care discussion time     Dr. Brand Males, M.D., [facility].C.P Pulmonary and Critical Care Medicine Staff Physician [place] [facility] Pager: [phone], If no answer or between  15:00h - 7:00h: call [phone]  08/12/2020 8:54 AM     LABS    PULMONARY Recent Labs  Lab 08/11/20 1849  PHART 7.293*  PCO2ART 51.7*  PO2ART 105  HCO3 24.3  O2SAT 96.6    CBC Recent Labs  Lab 08/11/20 0503 08/11/20 1847 08/12/20 0538  HGB 8.9* 8.3* 7.5*  HCT 28.8* 26.2* 24.4*  WBC 17.2* 42.0* 39.4*  PLT 339 560* 476*    COAGULATION Recent Labs  Lab 08/09/20 0957  INR 1.2    CARDIAC  No results for input(s): TROPONINI in the last 168  hours. No results for input(s): PROBNP in the last 168 hours.   CHEMISTRY Recent Labs  Lab 08/09/20 0735 08/10/20 0452 08/11/20 0503 08/11/20 1847 08/12/20 0538  NA 133* 136 136 137 138  K 3.5 4.1 3.7 3.7 3.8  CL 96* 97* 95* 98 103  CO2 27 27 24 25 22   GLUCOSE 104* 125* 122* 163* 206*  BUN 33* 40* 63* 63* 62*  CREATININE 1.45*  1.44* 1.65* 2.24* 2.37* 2.67*  CALCIUM 8.6* 9.0 8.8* 8.4* 8.4*  MG  --   --   --   --  2.1  PHOS  --   --   --   --  4.9*   Estimated Creatinine Clearance: 16.4 mL/min (A) (by C-G formula based on SCr of 2.67 mg/dL (H)).   LIVER Recent Labs  Lab 08/08/20 1756 08/09/20 0957 08/10/20 0452 08/11/20 1847  AST 24  --  18 15  ALT 15  --  14 12  ALKPHOS 66  --  51 46  BILITOT 0.7  --  0.4 0.5  PROT 7.6  --  6.0* 5.4*  ALBUMIN 3.2*  --  2.2* 2.0*  INR  --  1.2  --   --      INFECTIOUS Recent Labs  Lab 08/09/20 0508 08/11/20 1847 08/12/20 0538  LATICACIDVEN 0.7 2.0* 1.6     ENDOCRINE CBG (last 3)  No results for input(s): GLUCAP in the last 72 hours.       IMAGING x48h  - image(s) personally visualized  -   highlighted in bold DG CHEST PORT 1 VIEW  Result Date: 08/12/2020 CLINICAL DATA:  80 year old female COVID-19. EXAM: PORTABLE CHEST 1 VIEW COMPARISON:  Portable chest 08/09/2020 and earlier. FINDINGS: Portable AP semi upright view at 0507 hours. Cardiomegaly and lung volumes are stable but there is increasing veiling opacity at both lung bases. Associated streaky increased lung base opacity. But elsewhere the lungs appear fairly clear when allowing for portable technique. No pneumothorax or edema. Paucity of bowel gas in the upper abdomen. Stable visualized osseous structures. IMPRESSION: 1. Small but increasing bilateral pleural effusions since 08/09/2020.  Underlying cardiomegaly but no overt edema. 2. Associated streaky new lung base opacity could be atelectasis or COVID-19 pneumonia in this setting. Electronically Signed   By: Genevie Ann M.D.   On: 08/12/2020 06:48

## 2020-08-12 NOTE — Progress Notes (Addendum)
1 Day Post-Op   Subjective/Chief Complaint: Continues to tolerate extubation On pressors   Objective: Vital signs in last 24 hours: Temp:  [93.4 F (34.1 C)-97.9 F (36.6 C)] 96.7 F (35.9 C) (01/14 0500) Pulse Rate:  [49-90] 52 (01/14 0600) Resp:  [6-32] 10 (01/14 0600) BP: (77-163)/(23-89) 138/27 (01/14 0600) SpO2:  [96 %-100 %] 99 % (01/14 0600) Last BM Date: 08/04/20  Intake/Output from previous day: 01/13 0701 - 01/14 0700 In: 3960.8 [I.V.:3294.9; IV Piggyback:665.9] Out: 1180 [Urine:1030; Blood:150] Intake/Output this shift: No intake/output data recorded.  Exam: Awake  Abdomen obese.  Dressing changed, wound clean Ostomy viable Abdominal wall skin looks better, less erythema and induration  Lab Results:  Recent Labs    08/11/20 1847 08/12/20 0538  WBC 42.0* 39.4*  HGB 8.3* 7.5*  HCT 26.2* 24.4*  PLT 560* 476*   BMET Recent Labs    08/11/20 1847 08/12/20 0538  NA 137 138  K 3.7 3.8  CL 98 103  CO2 25 22  GLUCOSE 163* 206*  BUN 63* 62*  CREATININE 2.37* 2.67*  CALCIUM 8.4* 8.4*   PT/INR Recent Labs    08/09/20 0957  LABPROT 14.4  INR 1.2   ABG Recent Labs    08/11/20 1849  PHART 7.293*  HCO3 24.3    Studies/Results: DG CHEST PORT 1 VIEW  Result Date: 08/12/2020 CLINICAL DATA:  80 year old female COVID-19. EXAM: PORTABLE CHEST 1 VIEW COMPARISON:  Portable chest 08/09/2020 and earlier. FINDINGS: Portable AP semi upright view at 0507 hours. Cardiomegaly and lung volumes are stable but there is increasing veiling opacity at both lung bases. Associated streaky increased lung base opacity. But elsewhere the lungs appear fairly clear when allowing for portable technique. No pneumothorax or edema. Paucity of bowel gas in the upper abdomen. Stable visualized osseous structures. IMPRESSION: 1. Small but increasing bilateral pleural effusions since 08/09/2020. Underlying cardiomegaly but no overt edema. 2. Associated streaky new lung base opacity  could be atelectasis or COVID-19 pneumonia in this setting. Electronically Signed   By: Genevie Ann M.D.   On: 08/12/2020 06:48    Anti-infectives: Anti-infectives (From admission, onward)   Start     Dose/Rate Route Frequency Ordered Stop   08/10/20 1000  remdesivir 100 mg in sodium chloride 0.9 % 100 mL IVPB       "Followed by" Linked Group Details   100 mg 200 mL/hr over 30 Minutes Intravenous Daily 08/09/20 1045 08/14/20 0959   08/09/20 1200  remdesivir 200 mg in sodium chloride 0.9% 250 mL IVPB       "Followed by" Linked Group Details   200 mg 580 mL/hr over 30 Minutes Intravenous Once 08/09/20 1045 08/09/20 1430   08/09/20 0745  piperacillin-tazobactam (ZOSYN) IVPB 3.375 g        3.375 g 12.5 mL/hr over 240 Minutes Intravenous Every 8 hours 08/09/20 0735     08/09/20 0315  piperacillin-tazobactam (ZOSYN) IVPB 3.375 g        3.375 g 100 mL/hr over 30 Minutes Intravenous  Once 08/09/20 0306 08/09/20 0515      Assessment/Plan: s/p Procedure(s): EXPLORATORY LAPAROTOMY PARTIAL COLECTOMY AND COLOSTOMY WITH RESECTION OF A MESSENTERIC MASS (N/A)  Start wet to dry dressing changes.  Hope to transition to a VAC in a few days Try clear liquid diet Awaiting path on mesenteric mass Appreciate CCM's management  LOS: 3 days    Coralie Keens 08/12/2020

## 2020-08-12 NOTE — Progress Notes (Signed)
eLink Physician-Brief Progress Note Patient Name: [name] DOB: May 06, 1941 MRN: [id number]   Date of Service  08/12/2020  HPI/Events of Note  Patient with low diastolic blood pressure which then drags down her MAP below 60 mmHg. She started taking po today.  eICU Interventions  Midodrine 2.5 mg po tid started.        Kerry Kass Makeisha Jentsch 08/12/2020, 8:21 PM

## 2020-08-12 NOTE — Progress Notes (Signed)
PHARMACY NOTE -  ANTIBIOTIC RENAL DOSE ADJUSTMENT  Patient has been initiated on zosyn for IAI. SCr 2.67, estimated CrCl 16 ml/min  Plan: change zosyn to 2.25 gm IV q6h  Eudelia Bunch, Pharm.D 08/12/2020 10:35 AM

## 2020-08-13 DIAGNOSIS — K630 Abscess of intestine: Secondary | ICD-10-CM

## 2020-08-13 DIAGNOSIS — J432 Centrilobular emphysema: Secondary | ICD-10-CM

## 2020-08-13 DIAGNOSIS — K436 Other and unspecified ventral hernia with obstruction, without gangrene: Secondary | ICD-10-CM | POA: Diagnosis not present

## 2020-08-13 LAB — COMPREHENSIVE METABOLIC PANEL
ALT: 11 U/L (ref 0–44)
AST: 14 U/L — ABNORMAL LOW (ref 15–41)
Albumin: 2 g/dL — ABNORMAL LOW (ref 3.5–5.0)
Alkaline Phosphatase: 40 U/L (ref 38–126)
Anion gap: 12 (ref 5–15)
BUN: 64 mg/dL — ABNORMAL HIGH (ref 8–23)
CO2: 22 mmol/L (ref 22–32)
Calcium: 8.2 mg/dL — ABNORMAL LOW (ref 8.9–10.3)
Chloride: 103 mmol/L (ref 98–111)
Creatinine, Ser: 2.94 mg/dL — ABNORMAL HIGH (ref 0.44–1.00)
GFR, Estimated: 16 mL/min — ABNORMAL LOW (ref >60)
Glucose, Bld: 129 mg/dL — ABNORMAL HIGH (ref 70–99)
Potassium: 3.5 mmol/L (ref 3.5–5.1)
Sodium: 137 mmol/L (ref 135–145)
Total Bilirubin: 0.5 mg/dL (ref 0.3–1.2)
Total Protein: 5.3 g/dL — ABNORMAL LOW (ref 6.5–8.1)

## 2020-08-13 LAB — CBC
HCT: 21.4 % — ABNORMAL LOW (ref 36.0–46.0)
Hemoglobin: 6.8 g/dL — CL (ref 12.0–15.0)
MCH: 25.3 pg — ABNORMAL LOW (ref 26.0–34.0)
MCHC: 31.8 g/dL (ref 30.0–36.0)
MCV: 79.6 fL — ABNORMAL LOW (ref 80.0–100.0)
Platelets: 246 10*3/uL (ref 150–400)
RBC: 2.69 MIL/uL — ABNORMAL LOW (ref 3.87–5.11)
RDW: 15 % (ref 11.5–15.5)
WBC: 13.7 10*3/uL — ABNORMAL HIGH (ref 4.0–10.5)
nRBC: 0 % (ref 0.0–0.2)

## 2020-08-13 LAB — C-REACTIVE PROTEIN: CRP: 10.3 mg/dL — ABNORMAL HIGH (ref <1.0)

## 2020-08-13 LAB — PROTIME-INR
INR: 1.2 (ref 0.8–1.2)
Prothrombin Time: 14.9 seconds (ref 11.4–15.2)

## 2020-08-13 LAB — LACTIC ACID, PLASMA: Lactic Acid, Venous: 0.8 mmol/L (ref 0.5–1.9)

## 2020-08-13 LAB — PREPARE RBC (CROSSMATCH)

## 2020-08-13 LAB — D-DIMER, QUANTITATIVE: D-Dimer, Quant: 10.96 ug/mL-FEU — ABNORMAL HIGH (ref 0.00–0.50)

## 2020-08-13 LAB — FERRITIN: Ferritin: 119 ng/mL (ref 11–307)

## 2020-08-13 LAB — PHOSPHORUS: Phosphorus: 4.2 mg/dL (ref 2.5–4.6)

## 2020-08-13 MED ORDER — POTASSIUM CHLORIDE 10 MEQ/100ML IV SOLN
10.0000 meq | INTRAVENOUS | Status: AC
  Administered 2020-08-13 (×3): 10 meq via INTRAVENOUS
  Filled 2020-08-13 (×3): qty 100

## 2020-08-13 MED ORDER — SODIUM CHLORIDE 0.9% FLUSH
10.0000 mL | INTRAVENOUS | Status: DC | PRN
Start: 2020-08-13 — End: 2020-08-20

## 2020-08-13 MED ORDER — HYDRALAZINE HCL 20 MG/ML IJ SOLN
5.0000 mg | INTRAMUSCULAR | Status: DC | PRN
  Administered 2020-08-13 – 2020-08-14 (×3): 5 mg via INTRAVENOUS
  Filled 2020-08-13 (×3): qty 1

## 2020-08-13 MED ORDER — SODIUM CHLORIDE 0.9% FLUSH
10.0000 mL | Freq: Two times a day (BID) | INTRAVENOUS | Status: DC
  Administered 2020-08-13 – 2020-08-19 (×9): 10 mL

## 2020-08-13 MED ORDER — SODIUM CHLORIDE 0.9% IV SOLUTION: Freq: Once | INTRAVENOUS | Status: AC

## 2020-08-13 NOTE — Progress Notes (Signed)
Daily Progress Note   Patient Name: [name]       Date: 08/13/2020 DOB: 1941/03/15  Age: 80 y.o. MRN#: [id number] Attending Physician: Brand Males, MD Primary Care Physician: [name], MD Admit Date: 08/09/2020  Reason for Consultation/Follow-up: Establishing goals of care  Subjective: I saw and examined [name] today.  She was awake and alert lying in bed.  I asked how she has been feeling and she states that she has been having pain but "its going okay."  We discussed her procedure and her relief at being able to come off of ventilator.  Discussed her clinical picture overall with fluid overload, decreased kidney function, and need for support to maintain pressure at this point.  She nodded understanding and we discussed plan to continue to see how she does throughout the weekend.  I told her I would plan to check in again with her on Monday.  Length of Stay: 4  Current Medications: Scheduled Meds:  . sodium chloride   Intravenous Once  . allopurinol  100 mg Oral Daily  . Chlorhexidine Gluconate Cloth  6 each Topical Daily  . fluticasone furoate-vilanterol  1 puff Inhalation Daily  . heparin  5,000 Units Subcutaneous Q8H  . mouth rinse  15 mL Mouth Rinse BID  . midodrine  2.5 mg Oral TID WC  . Pitavastatin Magnesium  2 mg Oral Daily  . sodium chloride flush  5 mL Intracatheter Q8H    Continuous Infusions: . dextrose 5% lactated ringers 10 mL/hr at 08/13/20 0000  . phenylephrine (NEO-SYNEPHRINE) Adult infusion Stopped (08/12/20 1815)  . piperacillin-tazobactam (ZOSYN)  IV Stopped (08/13/20 0559)  . remdesivir 100 mg in NS 100 mL Stopped (08/12/20 1035)    PRN Meds: albuterol, HYDROmorphone (DILAUDID) injection, ondansetron **OR** ondansetron (ZOFRAN)  IV  Physical Exam         General: Alert, awake, reports having some pain but in no significant distress.  HEENT: No bruits, no goiter, no JVD Heart: Regular rate and rhythm.. Ext: No significant edema Skin: Warm and dry Neuro: Grossly intact, nonfocal.   Vital Signs: BP (!) 166/63 (BP Location: Right Arm)   Pulse 69   Temp (!) 96.5 F (35.8 C) (Axillary)   Resp 13   Ht 5' (1.524 m)   Wt 86.1 kg  SpO2 100%   BMI 37.07 kg/m  SpO2: SpO2: 100 % O2 Device: O2 Device: Nasal Cannula O2 Flow Rate: O2 Flow Rate (L/min): 2 L/min  Intake/output summary:   Intake/Output Summary (Last 24 hours) at 08/13/2020 0846 Last data filed at 08/13/2020 0600 Gross per 24 hour  Intake 1546.41 ml  Output 355 ml  Net 1191.41 ml   LBM: Last BM Date: 08/12/20 Baseline Weight: Weight: 83 kg Most recent weight: Weight: 86.1 kg       Palliative Assessment/Data:      Patient Active Problem List   Diagnosis Date Noted  . Shock circulatory ([place])   . Irreducible ventral hernia 08/09/2020  . Infection as cause of abscess of colon 08/09/2020  . COPD (chronic obstructive pulmonary disease) ([place]) 08/09/2020  . Left lower quadrant abdominal tenderness without rebound tenderness 02/06/2020  . Chronic respiratory failure with hypoxia ([place]) 02/06/2020  . Routine general medical examination at a health care facility 02/06/2020  . Umbilical hernia with obstruction 08/27/2018  . AVM (arteriovenous malformation) of small bowel, acquired with hemorrhage 08/14/2018  . Hernia, ventral 08/12/2018  . Multiple lung nodules on CT 05/05/2018  . Stenosis of cervical spine with myelopathy ([place]) 01/01/2018  . Atrial fibrillation ([place]) 10/17/2017  . CHF (congestive heart failure), NYHA class III, chronic, diastolic ([place]) 14/78/2956  . Symptomatic anemia 08/10/2017  . Angiodysplasia of cecum   . Angiodysplasia of duodenum   . Grade III diastolic dysfunction 21/30/8657  . MGUS (monoclonal gammopathy of unknown  significance) 11/08/2016  . Asteatotic eczema 02/25/2016  . COLD (chronic obstructive lung disease) ([place])   . Anemia, iron deficiency 11/16/2014  . Cor pulmonale ([place]) 11/16/2014  . OSA (obstructive sleep apnea) 06/22/2013  . Chronic venous stasis dermatitis of both lower extremities 06/02/2013  . Vitamin D deficiency 02/06/2013  . Osteopenia, senile 02/05/2013  . Other screening mammogram 02/05/2013  . Chronic kidney disease, stage 3b ([name]) 06/20/2010  . Morbid obesity ([place]) 04/19/2010  . Gout 01/10/2009  . ULCERATIVE COLITIS-LEFT SIDE 03/19/2008  . Vitamin B12 deficiency anemia 11/13/2006  . Hyperlipidemia with target LDL less than 100 11/27/1996  . GERD 07/30/1992  . HTN (hypertension) 07/30/1988    Palliative Care Assessment & Plan   Patient Profile: 80 year old female with past medical history of COPD, CKD, CHF longstanding hernia admitted with incarceration of hernia and evidence of perforation.  Plan is for surgical intervention with understanding high risk of complications per patient preference to pursue surgical intervention.  Recommendations/Plan:  Full code/full scope  She is status post surgery and was able to be successfully weaned from ventilator which was a major concern of hers.  She does continue to have issues with volume status, renal function, and need for pressure support.  Palliative care will plan to check in again on Monday.  Please call if there are acute needs with which we can be of assistance before that time.   Goals of Care and Additional Recommendations:  Limitations on Scope of Treatment: Full Scope Treatment  Code Status:    Code Status Orders  (From admission, onward)         Start     Ordered   08/09/20 0735  Full code  Continuous        08/09/20 0735        Code Status History    Date Active Date Inactive Code Status Order ID Comments User Context   05/07/2020 1401 05/12/2020 2028 Partial Code [id number]  Jonnie Finner, DO  Inpatient  08/12/2018 1332 08/14/2018 1620 Full Code [id number]  Marcell Anger, MD Inpatient   08/04/2018 1522 08/06/2018 2146 DNR [id number]  Edwin Dada, MD ED   10/17/2017 2309 10/20/2017 2047 DNR [id number]  Rise Patience, MD ED   08/09/2017 0040 08/15/2017 1555 DNR [id number]  Elwin Mocha, MD ED   11/08/2016 2026 11/11/2016 2001 Partial Code [id number]  Vianne Bulls, MD ED   03/19/2016 2109 03/22/2016 2157 Partial Code [id number]  Toy Baker, MD Inpatient   11/16/2014 1524 11/23/2014 1638 DNR [id number]  Samella Parr, NP Inpatient   06/01/2013 2001 06/05/2013 1502 Full Code [id number]  Aldean Jewett, MD ED   Advance Care Planning Activity       Prognosis:   Guarded  Discharge Planning:  To Be Determined  Care plan was discussed with patient, surgical team  Thank you for allowing the Palliative Medicine Team to assist in the care of this patient.   Time In: 1830 Time Out: 1900 Total Time 30 Prolonged Time Billed No   Greater than 50%  of this time was spent counseling and coordinating care related to the above assessment and plan.  Micheline Rough, MD  Please contact Palliative Medicine Team phone at [phone] for questions and concerns.

## 2020-08-13 NOTE — Progress Notes (Signed)
eLink Physician-Brief Progress Note Patient Name: [name] DOB: June 20, 1941 MRN: [id number]   Date of Service  08/13/2020  HPI/Events of Note  Pt SBP ranging 180-200's, No PRNs ordered. Pt HR sinus brady 50-60s. Post op. Covid +.  Discussed with Andee Poles, RN. Received dilaudid earlier for pain, just finished K riders. S/p PRBC in day time.   eICU Interventions  - Hydralazine IV prn ordered, Cr > 2.      Intervention Category Intermediate Interventions: Hypertension - evaluation and management  Elmer Sow 08/13/2020, 10:12 PM

## 2020-08-13 NOTE — Progress Notes (Signed)
2 Days Post-Op   Subjective/Chief Complaint: She reports that she is more comfortable    Objective: Vital signs in last 24 hours: Temp:  [96.5 F (35.8 C)-98.1 F (36.7 C)] 96.5 F (35.8 C) (01/15 0524) Pulse Rate:  [46-69] 69 (01/15 0524) Resp:  [8-22] 13 (01/15 0524) BP: (99-168)/(21-82) 166/63 (01/15 0524) SpO2:  [91 %-100 %] 100 % (01/15 0524) Last BM Date: 08/12/20  Intake/Output from previous day: 01/14 0701 - 01/15 0700 In: 1546.4 [P.O.:120; I.V.:1101; IV Piggyback:325.4] Out: 355 [Urine:345; Stool:10] Intake/Output this shift: No intake/output data recorded.  Exam: Awake Abdomen obese, less erythema and tenderness, ostomy pink  Lab Results:  Recent Labs    08/12/20 0538 08/13/20 0032  WBC 39.4* 13.7*  HGB 7.5* 6.8*  HCT 24.4* 21.4*  PLT 476* 246   BMET Recent Labs    08/12/20 0538 08/13/20 0251  NA 138 137  K 3.8 3.5  CL 103 103  CO2 22 22  GLUCOSE 206* 129*  BUN 62* 64*  CREATININE 2.67* 2.94*  CALCIUM 8.4* 8.2*   PT/INR Recent Labs    08/13/20 0251  LABPROT 14.9  INR 1.2   ABG Recent Labs    08/11/20 1849  PHART 7.293*  HCO3 24.3    Studies/Results: DG CHEST PORT 1 VIEW  Result Date: 08/12/2020 CLINICAL DATA:  80 year old female COVID-19. EXAM: PORTABLE CHEST 1 VIEW COMPARISON:  Portable chest 08/09/2020 and earlier. FINDINGS: Portable AP semi upright view at 0507 hours. Cardiomegaly and lung volumes are stable but there is increasing veiling opacity at both lung bases. Associated streaky increased lung base opacity. But elsewhere the lungs appear fairly clear when allowing for portable technique. No pneumothorax or edema. Paucity of bowel gas in the upper abdomen. Stable visualized osseous structures. IMPRESSION: 1. Small but increasing bilateral pleural effusions since 08/09/2020. Underlying cardiomegaly but no overt edema. 2. Associated streaky new lung base opacity could be atelectasis or COVID-19 pneumonia in this setting.  Electronically Signed   By: Genevie Ann M.D.   On: 08/12/2020 06:48    Anti-infectives: Anti-infectives (From admission, onward)   Start     Dose/Rate Route Frequency Ordered Stop   08/12/20 1200  piperacillin-tazobactam (ZOSYN) IVPB 2.25 g        2.25 g 100 mL/hr over 30 Minutes Intravenous Every 6 hours 08/12/20 1033     08/10/20 1000  remdesivir 100 mg in sodium chloride 0.9 % 100 mL IVPB       "Followed by" Linked Group Details   100 mg 200 mL/hr over 30 Minutes Intravenous Daily 08/09/20 1045 08/14/20 0959   08/09/20 1200  remdesivir 200 mg in sodium chloride 0.9% 250 mL IVPB       "Followed by" Linked Group Details   200 mg 580 mL/hr over 30 Minutes Intravenous Once 08/09/20 1045 08/09/20 1430   08/09/20 0745  piperacillin-tazobactam (ZOSYN) IVPB 3.375 g  Status:  Discontinued        3.375 g 12.5 mL/hr over 240 Minutes Intravenous Every 8 hours 08/09/20 0735 08/12/20 1033   08/09/20 0315  piperacillin-tazobactam (ZOSYN) IVPB 3.375 g        3.375 g 100 mL/hr over 30 Minutes Intravenous  Once 08/09/20 0306 08/09/20 0515      Assessment/Plan: s/p Procedure(s): EXPLORATORY LAPAROTOMY PARTIAL COLECTOMY AND COLOSTOMY WITH RESECTION OF A MESSENTERIC MASS (N/A)  WBC improved Continuing IV antibiotics Awaiting final path Creatinine up Transfused for anemia Continue wound care   LOS: 4 days    [name]  08/13/2020 

## 2020-08-13 NOTE — Progress Notes (Signed)
NAME:  [name], MRN:  [id number], DOB:  04-24-1941, LOS: 4 ADMISSION DATE:  08/09/2020, CONSULTATION DATE: 08/09/20  REFERRING MD:  Dr. Ninfa Linden , CHIEF COMPLAINT: Abdominal Pain   Brief History:  80 y/o F with known incarcerated ventral hernia admitted 1/11 to [facility] with 3 weeks of abdominal pain.  Work up found her to have a fluid collection in known ventral hernia sac and a 3.5 cm right renal mass worrisome for malignancy.  She was also found to be COVID positive. IR placed a drain into the abdominal wall abscess which had the appearance of stool.  She was taken to OR 1/13 for ex-lap with findings of mesenteric mass s/p resection and partial colectomy.   Past Medical History:  B12 Anemia  Ulcerative Colitis - left side  Morbid Obesity  Incarcerated Ventral Hernia  Pre Diabetes  MGUS  HTN  HLD  HFpEF - chronic combined class III  Atrial Fibrillation  Cor Pulmonale  COPD  Oxygen Dependent Respiratory Failure  OSA  Kidney Stones  CKD III  Significant Hospital Events:  1/11 Admit  1/13 Exploratory laparotomy, resection of mesenteric mass, partial colectomy with end colostomy 1/15 transfuse PRBC  Consults:  Cardiology  CCS  Procedures:    Significant Diagnostic Tests:   CT ABD / Pelvis 1/11 >> large ventral wall hernia containing a loop of the transverse colon. No evidence of obstruction but there is a new large air and fluid collection within the hernia sac concerning for perforation of the transverse colon with abscess or complicated diverticulitis or complicated appendicitis. 3.5 CM right renal mass consistent with renal cell carcinoma. Splenomegaly.   Micro Data:  COVID 1/11 >> positive Influenza A/B 1/11 >> negative  Abdominal Wall Abscess 1/11 >> Actinomyces odontolyticus  Antimicrobials:  Remdesivir 1/11 >>  Zosyn 1/11 >>   Interim History / Subjective:  Tolerating diet.  Denies dyspnea, chest pain.  Objective   Blood pressure (!) 166/63, pulse 69,  temperature 97.7 F (36.5 C), temperature source Oral, resp. rate 13, height 5' (1.524 m), weight 86.1 kg, SpO2 100 %.        Intake/Output Summary (Last 24 hours) at 08/13/2020 1122 Last data filed at 08/13/2020 0600 Gross per 24 hour  Intake 1054.44 ml  Output 355 ml  Net 699.44 ml   Filed Weights   08/08/20 1413 08/09/20 2329  Weight: 83 kg 86.1 kg   General - alert Eyes - pupils reactive ENT - no sinus tenderness, no stridor Cardiac - regular rate/rhythm, no murmur Chest - equal breath sounds b/l, no wheezing or rales Abdomen - soft, non tender, + bowel sounds, wound site clean Extremities - 1+ edema Skin - no rashes Neuro - normal strength, moves extremities, follows commands  Resolved Hospital Problem list      Assessment & Plan:   Incarcerated ventral hernia with mesenteric mass. - post ob care, nutrition per CCS - day 5 of Abx  Chronic diastolic CHF. - monitor hemodynamics  Anemia of critical illness. - f/u CBC after PRBC transfusion on 1/15  COVID 19 pneumonia. - complete remdesivir  Acute on chronic hypoxic respiratory failure from sleep disordered breathing, COPD with cor pulmonale. - goal SpO2 90 to 95% - continue breo  Right Renal Mass  - 3.5 cm on CT, worrisome for malignancy  - will need biopsy of mass  Stage IIIb CKD. - Baseline sr cr ~ 1.35-1.6 - f/u BMET  HLD - continue pravastatin   Hx of MGUS - outpatient  follow up   Best practice (evaluated daily)  Diet: clear liquid DVT prophylaxis: heparin sq GI prophylaxis: not indicated Mobility: as tolerated  Code status: full code Family: updated pt's husband at bedside Disposition: monitor in ICU / SDU overnight  LABS   CMP Latest Ref Rng & Units 08/13/2020 08/12/2020 08/11/2020  Glucose 70 - 99 mg/dL 129(H) 206(H) 163(H)  BUN 8 - 23 mg/dL 64(H) 62(H) 63(H)  Creatinine 0.44 - 1.00 mg/dL 2.94(H) 2.67(H) 2.37(H)  Sodium 135 - 145 mmol/L 137 138 137  Potassium 3.5 - 5.1 mmol/L 3.5 3.8  3.7  Chloride 98 - 111 mmol/L 103 103 98  CO2 22 - 32 mmol/L 22 22 25   Calcium 8.9 - 10.3 mg/dL 8.2(L) 8.4(L) 8.4(L)  Total Protein 6.5 - 8.1 g/dL 5.3(L) - 5.4(L)  Total Bilirubin 0.3 - 1.2 mg/dL 0.5 - 0.5  Alkaline Phos 38 - 126 U/L 40 - 46  AST 15 - 41 U/L 14(L) - 15  ALT 0 - 44 U/L 11 - 12    CBC Latest Ref Rng & Units 08/13/2020 08/12/2020 08/11/2020  WBC 4.0 - 10.5 K/uL 13.7(H) 39.4(H) 42.0(H)  Hemoglobin 12.0 - 15.0 g/dL 6.8(LL) 7.5(L) 8.3(L)  Hematocrit 36.0 - 46.0 % 21.4(L) 24.4(L) 26.2(L)  Platelets 150 - 400 K/uL 246 476(H) 560(H)    ABG    Component Value Date/Time   PHART 7.293 (L) 08/11/2020 1849   PCO2ART 51.7 (H) 08/11/2020 1849   PO2ART 105 08/11/2020 1849   HCO3 24.3 08/11/2020 1849   TCO2 37.1 11/20/2014 1453   ACIDBASEDEF 1.7 08/11/2020 1849   O2SAT 96.6 08/11/2020 1849    Signature:  Chesley Mires, MD [place] Pager - [phone] 08/13/2020, 11:34 AM

## 2020-08-13 NOTE — Progress Notes (Addendum)
eLink Physician-Brief Progress Note Patient Name: [name] DOB: 12-Apr-1941 MRN: [id number]   Date of Service  08/13/2020  HPI/Events of Note  Hemoglobin 6.8 gm / dl  eICU Interventions  Transfuse 1 unit PRBC. Informed consent was obtained from the patient via phone , in the presence of bedside RN Claiborne Billings.        Reilley Latorre U [place] Aprea 08/13/2020, 1:23 AM

## 2020-08-14 DIAGNOSIS — K651 Peritoneal abscess: Secondary | ICD-10-CM | POA: Diagnosis not present

## 2020-08-14 DIAGNOSIS — J432 Centrilobular emphysema: Secondary | ICD-10-CM | POA: Diagnosis not present

## 2020-08-14 DIAGNOSIS — U071 COVID-19: Secondary | ICD-10-CM

## 2020-08-14 DIAGNOSIS — N1832 Chronic kidney disease, stage 3b: Secondary | ICD-10-CM | POA: Diagnosis not present

## 2020-08-14 DIAGNOSIS — I5032 Chronic diastolic (congestive) heart failure: Secondary | ICD-10-CM | POA: Diagnosis not present

## 2020-08-14 LAB — CBC
HCT: 28 % — ABNORMAL LOW (ref 36.0–46.0)
Hemoglobin: 8.8 g/dL — ABNORMAL LOW (ref 12.0–15.0)
MCH: 25.3 pg — ABNORMAL LOW (ref 26.0–34.0)
MCHC: 31.4 g/dL (ref 30.0–36.0)
MCV: 80.5 fL (ref 80.0–100.0)
Platelets: 291 10*3/uL (ref 150–400)
RBC: 3.48 MIL/uL — ABNORMAL LOW (ref 3.87–5.11)
RDW: 15.3 % (ref 11.5–15.5)
WBC: 15.1 10*3/uL — ABNORMAL HIGH (ref 4.0–10.5)
nRBC: 0.1 % (ref 0.0–0.2)

## 2020-08-14 LAB — PHOSPHORUS: Phosphorus: 3.6 mg/dL (ref 2.5–4.6)

## 2020-08-14 LAB — TYPE AND SCREEN
ABO/RH(D): O POS
Antibody Screen: NEGATIVE
Donor AG Type: NEGATIVE
Unit division: 0

## 2020-08-14 LAB — BPAM RBC
Blood Product Expiration Date: 202202162359
ISSUE DATE / TIME: 202201151114
Unit Type and Rh: 5100

## 2020-08-14 LAB — BASIC METABOLIC PANEL
Anion gap: 11 (ref 5–15)
BUN: 61 mg/dL — ABNORMAL HIGH (ref 8–23)
CO2: 25 mmol/L (ref 22–32)
Calcium: 8.6 mg/dL — ABNORMAL LOW (ref 8.9–10.3)
Chloride: 103 mmol/L (ref 98–111)
Creatinine, Ser: 2.64 mg/dL — ABNORMAL HIGH (ref 0.44–1.00)
GFR, Estimated: 18 mL/min — ABNORMAL LOW (ref >60)
Glucose, Bld: 84 mg/dL (ref 70–99)
Potassium: 3.9 mmol/L (ref 3.5–5.1)
Sodium: 139 mmol/L (ref 135–145)

## 2020-08-14 LAB — MAGNESIUM: Magnesium: 2.3 mg/dL (ref 1.7–2.4)

## 2020-08-14 MED ORDER — AMLODIPINE BESYLATE 10 MG PO TABS: 10.0000 mg | ORAL_TABLET | Freq: Every day | ORAL | Status: DC

## 2020-08-14 MED ORDER — ISOSORB DINITRATE-HYDRALAZINE 20-37.5 MG PO TABS
1.0000 | ORAL_TABLET | Freq: Two times a day (BID) | ORAL | Status: DC
  Administered 2020-08-14 – 2020-08-16 (×5): 1 via ORAL
  Filled 2020-08-14 (×6): qty 1

## 2020-08-14 MED ORDER — PRAVASTATIN SODIUM 20 MG PO TABS
40.0000 mg | ORAL_TABLET | Freq: Every day | ORAL | Status: DC
  Administered 2020-08-14 – 2020-08-19 (×6): 40 mg via ORAL
  Filled 2020-08-14 (×6): qty 2

## 2020-08-14 MED ORDER — TORSEMIDE 20 MG PO TABS: 20.0000 mg | ORAL_TABLET | Freq: Two times a day (BID) | ORAL | Status: DC

## 2020-08-14 MED ORDER — METOPROLOL TARTRATE 12.5 MG HALF TABLET
12.5000 mg | ORAL_TABLET | Freq: Two times a day (BID) | ORAL | Status: DC
Start: 2020-08-14 — End: 2020-08-20
  Administered 2020-08-14 – 2020-08-19 (×11): 12.5 mg via ORAL
  Filled 2020-08-14 (×10): qty 1

## 2020-08-14 MED ORDER — TORSEMIDE 20 MG PO TABS
20.0000 mg | ORAL_TABLET | Freq: Every day | ORAL | Status: DC
  Administered 2020-08-14 – 2020-08-19 (×6): 20 mg via ORAL
  Filled 2020-08-14 (×7): qty 1

## 2020-08-14 MED ORDER — HYDRALAZINE HCL 20 MG/ML IJ SOLN
10.0000 mg | INTRAMUSCULAR | Status: AC
  Administered 2020-08-14 (×2): 10 mg via INTRAVENOUS
  Filled 2020-08-14 (×2): qty 1

## 2020-08-14 MED ORDER — AMLODIPINE BESYLATE 10 MG PO TABS
10.0000 mg | ORAL_TABLET | Freq: Every day | ORAL | Status: DC
  Administered 2020-08-14 – 2020-08-19 (×6): 10 mg via ORAL
  Filled 2020-08-14 (×5): qty 1

## 2020-08-14 MED ORDER — SODIUM CHLORIDE 0.9 % IV SOLN: INTRAVENOUS | Status: DC | PRN

## 2020-08-14 MED ORDER — LABETALOL HCL 5 MG/ML IV SOLN
10.0000 mg | INTRAVENOUS | Status: DC | PRN
  Administered 2020-08-14 – 2020-08-16 (×5): 10 mg via INTRAVENOUS
  Filled 2020-08-14 (×5): qty 4

## 2020-08-14 NOTE — Progress Notes (Signed)
Assisted tele visit to patient with family member.  Neilah Fulwider M, RN  

## 2020-08-14 NOTE — Progress Notes (Addendum)
NAME:  [name], MRN:  [id number], DOB:  [birth year], LOS: 5 ADMISSION DATE:  08/09/2020, CONSULTATION DATE: 08/09/20  REFERRING MD:  Dr. Ninfa Linden , CHIEF COMPLAINT: Abdominal Pain   Brief History:  80 y/o F with known incarcerated ventral hernia admitted 1/11 to [facility] with 3 weeks of abdominal pain.  Work up found her to have a fluid collection in known ventral hernia sac and a 3.5 cm right renal mass worrisome for malignancy.  She was also found to be COVID positive. IR placed a drain into the abdominal wall abscess which had the appearance of stool.  She was taken to OR 1/13 for ex-lap with findings of mesenteric mass s/p resection and partial colectomy.   Past Medical History:  B12 Anemia  Ulcerative Colitis - left side  Morbid Obesity  Incarcerated Ventral Hernia  Pre Diabetes  MGUS  HTN  HLD  HFpEF - chronic combined class III  Atrial Fibrillation  Cor Pulmonale  COPD  Oxygen Dependent Respiratory Failure  OSA  Kidney Stones  CKD III  Significant Hospital Events:  1/11 Admit  1/13 Exploratory laparotomy, resection of mesenteric mass, partial colectomy with end colostomy 1/15 transfuse PRBC 1/16 transfer to telemetry  Consults:  Cardiology  CCS Palliative care  Procedures:    Significant Diagnostic Tests:   CT ABD / Pelvis 1/11 >> large ventral wall hernia containing a loop of the transverse colon. No evidence of obstruction but there is a new large air and fluid collection within the hernia sac concerning for perforation of the transverse colon with abscess or complicated diverticulitis or complicated appendicitis. 3.5 CM right renal mass consistent with renal cell carcinoma. Splenomegaly.   Micro Data:  COVID 1/11 >> positive Influenza A/B 1/11 >> negative  Abdominal Wall Abscess 1/11 >> Actinomyces odontolyticus  Antimicrobials:  Remdesivir 1/11 >>  Zosyn 1/11 >>   Interim History / Subjective:  Blood pressure up overnight.  Objective   Blood  pressure (!) 204/40, pulse 67, temperature 97.9 F (36.6 C), temperature source Oral, resp. rate 13, height 5' (1.524 m), weight 86.1 kg, SpO2 96 %.        Intake/Output Summary (Last 24 hours) at 08/14/2020 1204 Last data filed at 08/14/2020 0800 Gross per 24 hour  Intake 573.86 ml  Output 350 ml  Net 223.86 ml   Filed Weights   08/08/20 1413 08/09/20 2329  Weight: 83 kg 86.1 kg    General - alert Eyes - pupils reactive ENT - no sinus tenderness, no stridor Cardiac - regular rate/rhythm, no murmur Chest - scattered rhonchi Abdomen - soft, non tender, wound site clean, colostomy in place Extremities - no cyanosis, clubbing, or edema Skin - no rashes Neuro - normal strength, moves extremities, follows commands Psych - normal mood and behavior   Resolved Hospital Problem list      Assessment & Plan:   Incarcerated ventral hernia with mesenteric mass. - post op care, nutrition per CCS - day 6 of ABx  Chronic diastolic CHF, HLD. - resume bidil, lopressor, demadex - continue pravastatin - prn labetalol IV for goal SBP < 160  Anemia of critical illness. - f/u CBC - transfuse for Hb < 7   COVID 19 pneumonia. - completed remdesivir  Acute on chronic hypoxic respiratory failure from sleep disordered breathing, COPD with cor pulmonale. - goal SpO2 90 to 95% - continue breo  Right Renal Mass  - 3.5 cm on CT, worrisome for malignancy  - will need biopsy of mass  Stage IIIb CKD. - Baseline sr cr ~ 1.35-1.6 - f/u BMET  Hx of MGUS - outpatient follow up   Best practice (evaluated daily)  Diet: full liquid DVT prophylaxis: heparin sq GI prophylaxis: not indicated Mobility: as tolerated  Code status: full code Family: updated pt's husband at bedside on 1/15 Disposition: transfer to telemetry  To [facility] 1/17 and PCCM off.  LABS   CMP Latest Ref Rng & Units 08/14/2020 08/13/2020 08/12/2020  Glucose 70 - 99 mg/dL 84 129(H) 206(H)  BUN 8 - 23 mg/dL 61(H) 64(H)  62(H)  Creatinine 0.44 - 1.00 mg/dL 2.64(H) 2.94(H) 2.67(H)  Sodium 135 - 145 mmol/L 139 137 138  Potassium 3.5 - 5.1 mmol/L 3.9 3.5 3.8  Chloride 98 - 111 mmol/L 103 103 103  CO2 22 - 32 mmol/L 25 22 22   Calcium 8.9 - 10.3 mg/dL 8.6(L) 8.2(L) 8.4(L)  Total Protein 6.5 - 8.1 g/dL - 5.3(L) -  Total Bilirubin 0.3 - 1.2 mg/dL - 0.5 -  Alkaline Phos 38 - 126 U/L - 40 -  AST 15 - 41 U/L - 14(L) -  ALT 0 - 44 U/L - 11 -    CBC Latest Ref Rng & Units 08/14/2020 08/13/2020 08/12/2020  WBC 4.0 - 10.5 K/uL 15.1(H) 13.7(H) 39.4(H)  Hemoglobin 12.0 - 15.0 g/dL 8.8(L) 6.8(LL) 7.5(L)  Hematocrit 36.0 - 46.0 % 28.0(L) 21.4(L) 24.4(L)  Platelets 150 - 400 K/uL 291 246 476(H)    ABG    Component Value Date/Time   PHART 7.293 (L) 08/11/2020 1849   PCO2ART 51.7 (H) 08/11/2020 1849   PO2ART 105 08/11/2020 1849   HCO3 24.3 08/11/2020 1849   TCO2 37.1 11/20/2014 1453   ACIDBASEDEF 1.7 08/11/2020 1849   O2SAT 96.6 08/11/2020 1849    Signature:  Chesley Mires, MD [place] Pager - [phone] 08/14/2020, 12:04 PM

## 2020-08-14 NOTE — Progress Notes (Signed)
eLink Physician-Brief Progress Note Patient Name: [name] DOB: April 13, 1941 MRN: [id number]   Date of Service  08/14/2020  HPI/Events of Note  Pt given 2 doses of PRN Hydralazine but BP remains high @ 202/54.  eICU Interventions  Hydralazine 10 mg q1 hr prn x 2 doses ordered. Stable elevated Creatinine. HR 50 to 70's.  Call back if not better. Not in pain.  No fever.      Intervention Category Intermediate Interventions: Hypertension - evaluation and management  Elmer Sow 08/14/2020, 3:55 AM

## 2020-08-14 NOTE — Progress Notes (Signed)
3 Days Post-Op   Subjective/Chief Complaint: Less abdominal pain Taking some po   Objective: Vital signs in last 24 hours: Temp:  [97.7 F (36.5 C)-98.1 F (36.7 C)] 97.8 F (36.6 C) (01/15 1700) Pulse Rate:  [52-80] 55 (01/16 0800) Resp:  [11-20] 14 (01/16 0800) BP: (148-230)/(21-71) 153/21 (01/16 0800) SpO2:  [93 %-100 %] 98 % (01/16 0800) Last BM Date: 08/13/20  Intake/Output from previous day: 01/15 0701 - 01/16 0700 In: 523.9 [I.V.:50; Blood:376; IV Piggyback:97.9] Out: 350 [Urine:200; Stool:150] Intake/Output this shift: No intake/output data recorded.  Exam: Awake Abdomen with less tenderness, ostomy productive Wound stable  Lab Results:  Recent Labs    08/13/20 0032 08/14/20 0625  WBC 13.7* 15.1*  HGB 6.8* 8.8*  HCT 21.4* 28.0*  PLT 246 291   BMET Recent Labs    08/13/20 0251 08/14/20 0625  NA 137 139  K 3.5 3.9  CL 103 103  CO2 22 25  GLUCOSE 129* 84  BUN 64* 61*  CREATININE 2.94* 2.64*  CALCIUM 8.2* 8.6*   PT/INR Recent Labs    08/13/20 0251  LABPROT 14.9  INR 1.2   ABG Recent Labs    08/11/20 1849  PHART 7.293*  HCO3 24.3    Studies/Results: No results found.  Anti-infectives: Anti-infectives (From admission, onward)   Start     Dose/Rate Route Frequency Ordered Stop   08/12/20 1200  piperacillin-tazobactam (ZOSYN) IVPB 2.25 g        2.25 g 100 mL/hr over 30 Minutes Intravenous Every 6 hours 08/12/20 1033     08/10/20 1000  remdesivir 100 mg in sodium chloride 0.9 % 100 mL IVPB       "Followed by" Linked Group Details   100 mg 200 mL/hr over 30 Minutes Intravenous Daily 08/09/20 1045 08/13/20 1101   08/09/20 1200  remdesivir 200 mg in sodium chloride 0.9% 250 mL IVPB       "Followed by" Linked Group Details   200 mg 580 mL/hr over 30 Minutes Intravenous Once 08/09/20 1045 08/09/20 1430   08/09/20 0745  piperacillin-tazobactam (ZOSYN) IVPB 3.375 g  Status:  Discontinued        3.375 g 12.5 mL/hr over 240 Minutes  Intravenous Every 8 hours 08/09/20 0735 08/12/20 1033   08/09/20 0315  piperacillin-tazobactam (ZOSYN) IVPB 3.375 g        3.375 g 100 mL/hr over 30 Minutes Intravenous  Once 08/09/20 0306 08/09/20 0515      Assessment/Plan: s/p Procedure(s): EXPLORATORY LAPAROTOMY PARTIAL COLECTOMY AND COLOSTOMY WITH RESECTION OF A MESSENTERIC MASS (N/A)   Continue wound care Antibiotics Advance to full liquids Awaiting pathology  LOS: 5 days    Coralie Keens MD 08/14/2020

## 2020-08-15 DIAGNOSIS — N1832 Chronic kidney disease, stage 3b: Secondary | ICD-10-CM | POA: Diagnosis not present

## 2020-08-15 DIAGNOSIS — J432 Centrilobular emphysema: Secondary | ICD-10-CM | POA: Diagnosis not present

## 2020-08-15 DIAGNOSIS — K436 Other and unspecified ventral hernia with obstruction, without gangrene: Secondary | ICD-10-CM | POA: Diagnosis not present

## 2020-08-15 DIAGNOSIS — K651 Peritoneal abscess: Secondary | ICD-10-CM | POA: Diagnosis not present

## 2020-08-15 DIAGNOSIS — Z515 Encounter for palliative care: Secondary | ICD-10-CM | POA: Diagnosis not present

## 2020-08-15 LAB — BASIC METABOLIC PANEL
Anion gap: 12 (ref 5–15)
BUN: 59 mg/dL — ABNORMAL HIGH (ref 8–23)
CO2: 25 mmol/L (ref 22–32)
Calcium: 8.6 mg/dL — ABNORMAL LOW (ref 8.9–10.3)
Chloride: 104 mmol/L (ref 98–111)
Creatinine, Ser: 2.29 mg/dL — ABNORMAL HIGH (ref 0.44–1.00)
GFR, Estimated: 21 mL/min — ABNORMAL LOW (ref >60)
Glucose, Bld: 102 mg/dL — ABNORMAL HIGH (ref 70–99)
Potassium: 3.8 mmol/L (ref 3.5–5.1)
Sodium: 141 mmol/L (ref 135–145)

## 2020-08-15 LAB — CBC
HCT: 24.9 % — ABNORMAL LOW (ref 36.0–46.0)
Hemoglobin: 7.9 g/dL — ABNORMAL LOW (ref 12.0–15.0)
MCH: 25.2 pg — ABNORMAL LOW (ref 26.0–34.0)
MCHC: 31.7 g/dL (ref 30.0–36.0)
MCV: 79.6 fL — ABNORMAL LOW (ref 80.0–100.0)
Platelets: 250 10*3/uL (ref 150–400)
RBC: 3.13 MIL/uL — ABNORMAL LOW (ref 3.87–5.11)
RDW: 15.7 % — ABNORMAL HIGH (ref 11.5–15.5)
WBC: 11.8 10*3/uL — ABNORMAL HIGH (ref 4.0–10.5)
nRBC: 0.2 % (ref 0.0–0.2)

## 2020-08-15 LAB — RENAL FUNCTION PANEL
Albumin: 2.2 g/dL — ABNORMAL LOW (ref 3.5–5.0)
Anion gap: 11 (ref 5–15)
BUN: 52 mg/dL — ABNORMAL HIGH (ref 8–23)
CO2: 25 mmol/L (ref 22–32)
Calcium: 8.6 mg/dL — ABNORMAL LOW (ref 8.9–10.3)
Chloride: 104 mmol/L (ref 98–111)
Creatinine, Ser: 2.06 mg/dL — ABNORMAL HIGH (ref 0.44–1.00)
GFR, Estimated: 24 mL/min — ABNORMAL LOW (ref >60)
Glucose, Bld: 112 mg/dL — ABNORMAL HIGH (ref 70–99)
Phosphorus: 2.7 mg/dL (ref 2.5–4.6)
Potassium: 3.5 mmol/L (ref 3.5–5.1)
Sodium: 140 mmol/L (ref 135–145)

## 2020-08-15 LAB — SURGICAL PATHOLOGY

## 2020-08-15 LAB — AEROBIC/ANAEROBIC CULTURE W GRAM STAIN (SURGICAL/DEEP WOUND): Gram Stain: NONE SEEN

## 2020-08-15 MED ORDER — SODIUM CHLORIDE 0.9 % IV SOLN
3.0000 g | Freq: Two times a day (BID) | INTRAVENOUS | Status: DC
  Administered 2020-08-15 – 2020-08-17 (×6): 3 g via INTRAVENOUS
  Filled 2020-08-15: qty 8
  Filled 2020-08-15 (×2): qty 3
  Filled 2020-08-15: qty 8
  Filled 2020-08-15: qty 3
  Filled 2020-08-15 (×2): qty 8

## 2020-08-15 MED ORDER — HYDRALAZINE HCL 20 MG/ML IJ SOLN
10.0000 mg | Freq: Four times a day (QID) | INTRAMUSCULAR | Status: DC | PRN
  Administered 2020-08-16: 10 mg via INTRAVENOUS
  Filled 2020-08-15: qty 1

## 2020-08-15 NOTE — Progress Notes (Signed)
PROGRESS NOTE  [name]  ENI:[id number] DOB: 1941-04-05 DOA: 08/09/2020 PCP: [name], MD   Brief Narrative: [name] is an 80 y.o. female with a history of 2L O2-dependent COPD, stage IIIbCKD, chronic HFpEF, morbid obesity who presented to the ED with 3 weeks of worsening abdominal pain and enlargement of chronic ventral hernia found to have hernia incarceration with evidence of perforation (diverticular vs. appendix) as well as covid-19 pneumonia. IR performed aspiration and drain placement 1/11 which returned feculent material.  Patient was seen by general surgery and underwent operative management on 1/14.  Postoperatively, she developed hypotension requiring vasopressors and was transferred to the ICU.  She has since clinically stabilized and transferred back to [facility] on 1/17  Assessment & Plan: Principal Problem:   Irreducible ventral hernia Active Problems:   Morbid obesity ([place])   HTN (hypertension)   Chronic kidney disease, stage 3b (HCC)   CHF (congestive heart failure), NYHA class III, chronic, diastolic (HCC)   Infection as cause of abscess of colon   COPD (chronic obstructive pulmonary disease) ([place])   Shock circulatory ([place])  Incarcerated ventral hernia with mesenteric mass: With suspected colonic/appendiceal perforation s/p tube placement by IR 1/11.  -Patient had operative management on 1/14 by general surgery with resection of mass, colectomy/colostomy -Currently on Unasyn -Postoperatively, advancing diet, currently on full liquid  Covid-19 pneumonia: Breakthrough case (s/p [place] 5/22, 01/09/2020) with + PCR 1/11 and subtle infiltrates on CXR.  -She completed a course of remdesivir -Respiratory status currently appears stable  Chronic hypoxic respiratory failure due to COPD: Without exacerbation.  -She reports that she is chronically on 2 L of oxygen -Currently on 4 L of oxygen with no shortness of breath, oxygen saturations in the high 90s -Will  wean down to 2 L monitor oxygen saturations - Continue bronchodilators.  - Pulmonary has evaluated the patient.  - Aggressive pulmonary toilet  Chronic HFpEF: G2DD, preserved LVEF.  - Continue metoprolol, bidil.  -Continue oral torsemide -Monitor overall volume status  Anemia of critical illness -Transfuse for hemoglobin less than 7 -Currently no signs for ongoing bleeding -Continue to monitor  Acute kidney injury on stage IIIb CKD: SCr near baseline.  -Baseline creatinine approximately 1.5 - Avoid nephrotoxins.  -Clinically appears to be volume overloaded -She is on oral torsemide -Overall creatinine appears to be improving -Continue to follow  Left renal mass:  - Urology recommending addressing at a later time.   HLD:  - Pravastatin  Obesity: Estimated body mass index is 37.07 kg/m as calculated from the following:   Height as of this encounter: 5' (1.524 m).   Weight as of this encounter: 86.1 kg.  Gout: No flare - Continue allopurinol  Generalized weakness -Physical therapy evaluation  DVT prophylaxis: [place] heparin Code Status: Full Family Communication: updated husband 1/17 over the phone Disposition Plan:  Status is: Inpatient  Remains inpatient appropriate because:Ongoing diagnostic testing needed not appropriate for outpatient work up and Inpatient level of care appropriate due to severity of illness  Dispo: The patient is from: Home              Anticipated d/c is to: TBD              Anticipated d/c date is: 2 days              Patient currently is not medically stable to d/c.  Consultants:   IR  General surgery  Cardiology  Pulmonary/CCM  Palliative care  Procedures:  Anterior abdominal wall drain placement 21F pigtail drain 08/09/2020 Dr. Earleen Newport 1/14: EXPLORATORY LAPAROTOMY , RESECTION OF MESENTERIC MASS, PARTIAL COLECTOMY, END COLOSTOMY  Antimicrobials:  Zosyn 1/11>1/16  Ampicillin 1/17 >  Remdesivir 1/11>1/17  Subjective: She  denies any shortness of breath.  She does have some cough.  Had some liquids earlier today.  Did not have any vomiting, but continues to have some abdominal discomfort Objective: Vitals:   08/15/20 0800 08/15/20 0932 08/15/20 1054 08/15/20 1332  BP: (!) 185/57 (!) 144/121 (!) 171/72 (!) 175/62  Pulse: 60 71  66  Resp: 13 18  18   Temp:  97.8 F (36.6 C)  97.7 F (36.5 C)  TempSrc:  Oral  Oral  SpO2: 92% 100%  97%  Weight:      Height:        Intake/Output Summary (Last 24 hours) at 08/15/2020 1614 Last data filed at 08/15/2020 1500 Gross per 24 hour  Intake 780 ml  Output 1890 ml  Net -1110 ml   Filed Weights   08/08/20 1413 08/09/20 2329  Weight: 83 kg 86.1 kg   General exam: Alert, awake, oriented x 3 Respiratory system: Clear to auscultation. Respiratory effort normal. Cardiovascular system:RRR. No murmurs, rubs, gallops. Gastrointestinal system: Abdomen is nondistended, soft and nontender. No organomegaly or masses felt. Normal bowel sounds heard. Ostomy in place Central nervous system: Alert and oriented. No focal neurological deficits. Extremities: 1+ edema in upper and lower extremities Skin: No rashes, lesions or ulcers Psychiatry: Judgement and insight appear normal. Mood & affect appropriate.      Data Reviewed: I have personally reviewed following labs and imaging studies  CBC: Recent Labs  Lab 08/11/20 1847 08/12/20 0538 08/13/20 0032 08/14/20 0625 08/15/20 0500  WBC 42.0* 39.4* 13.7* 15.1* 11.8*  HGB 8.3* 7.5* 6.8* 8.8* 7.9*  HCT 26.2* 24.4* 21.4* 28.0* 24.9*  MCV 79.2* 81.6 79.6* 80.5 79.6*  PLT 560* 476* 246 291 401   Basic Metabolic Panel: Recent Labs  Lab 08/11/20 1847 08/12/20 0538 08/13/20 0251 08/14/20 0625 08/15/20 0500  NA 137 138 137 139 141  K 3.7 3.8 3.5 3.9 3.8  CL 98 103 103 103 104  CO2 25 22 22 25 25   GLUCOSE 163* 206* 129* 84 102*  BUN 63* 62* 64* 61* 59*  CREATININE 2.37* 2.67* 2.94* 2.64* 2.29*  CALCIUM 8.4* 8.4* 8.2*  8.6* 8.6*  MG  --  2.1  --  2.3  --   PHOS  --  4.9* 4.2 3.6  --    GFR: Estimated Creatinine Clearance: 19.1 mL/min (A) (by C-G formula based on SCr of 2.29 mg/dL (H)). Liver Function Tests: Recent Labs  Lab 08/08/20 1756 08/10/20 0452 08/11/20 1847 08/13/20 0251  AST 24 18 15  14*  ALT 15 14 12 11   ALKPHOS 66 51 46 40  BILITOT 0.7 0.4 0.5 0.5  PROT 7.6 6.0* 5.4* 5.3*  ALBUMIN 3.2* 2.2* 2.0* 2.0*   Recent Labs  Lab 08/08/20 1756  LIPASE 35   No results for input(s): AMMONIA in the last 168 hours. Coagulation Profile: Recent Labs  Lab 08/09/20 0957 08/13/20 0251  INR 1.2 1.2   Cardiac Enzymes: No results for input(s): CKTOTAL, CKMB, CKMBINDEX, TROPONINI in the last 168 hours. BNP (last 3 results) No results for input(s): PROBNP in the last 8760 hours. HbA1C: No results for input(s): HGBA1C in the last 72 hours. CBG: No results for input(s): GLUCAP in the last 168 hours. Lipid Profile: No results for input(s): CHOL, HDL,  LDLCALC, TRIG, CHOLHDL, LDLDIRECT in the last 72 hours. Thyroid Function Tests: No results for input(s): TSH, T4TOTAL, FREET4, T3FREE, THYROIDAB in the last 72 hours. Anemia Panel: Recent Labs    08/13/20 0251  FERRITIN 119   Urine analysis:    Component Value Date/Time   COLORURINE YELLOW 08/09/2020 0700   APPEARANCEUR HAZY (A) 08/09/2020 0700   LABSPEC 1.034 (H) 08/09/2020 0700   PHURINE 5.0 08/09/2020 0700   GLUCOSEU NEGATIVE 08/09/2020 0700   GLUCOSEU NEGATIVE 11/05/2017 1103   HGBUR NEGATIVE 08/09/2020 0700   HGBUR moderate 02/02/2008 1439   BILIRUBINUR NEGATIVE 08/09/2020 0700   KETONESUR NEGATIVE 08/09/2020 0700   PROTEINUR 30 (A) 08/09/2020 0700   UROBILINOGEN 0.2 11/05/2017 1103   NITRITE NEGATIVE 08/09/2020 0700   LEUKOCYTESUR NEGATIVE 08/09/2020 0700   Recent Results (from the past 240 hour(s))  Resp Panel by RT-PCR (Flu A&B, Covid) Nasopharyngeal Swab     Status: Abnormal   Collection Time: 08/09/20  4:41 AM    Specimen: Nasopharyngeal Swab; Nasopharyngeal(NP) swabs in vial transport medium  Result Value Ref Range Status   SARS Coronavirus 2 by RT PCR POSITIVE (A) NEGATIVE Final    Comment: RESULT CALLED TO, READ BACK BY AND VERIFIED WITH: GARRISON,G. RN AT 8119 08/09/20 MULLINS,T (NOTE) SARS-CoV-2 target nucleic acids are DETECTED.  The SARS-CoV-2 RNA is generally detectable in upper respiratory specimens during the acute phase of infection. Positive results are indicative of the presence of the identified virus, but do not rule out bacterial infection or co-infection with other pathogens not detected by the test. Clinical correlation with patient history and other diagnostic information is necessary to determine patient infection status. The expected result is Negative.  Fact Sheet for Patients: EntrepreneurPulse.com.au  Fact Sheet for Healthcare Providers: IncredibleEmployment.be  This test is not yet approved or cleared by the [place] FDA and  has been authorized for detection and/or diagnosis of SARS-CoV-2 by FDA under an Emergency Use Authorization (EUA).  This EUA will remain in effect (meaning this tes t can be used) for the duration of  the COVID-19 declaration under Section 564(b)(1) of the Act, 21 U.S.C. section 360bbb-3(b)(1), unless the authorization is terminated or revoked sooner.     Influenza A by PCR NEGATIVE NEGATIVE Final   Influenza B by PCR NEGATIVE NEGATIVE Final    Comment: (NOTE) The Xpert Xpress SARS-CoV-2/FLU/RSV plus assay is intended as an aid in the diagnosis of influenza from Nasopharyngeal swab specimens and should not be used as a sole basis for treatment. Nasal washings and aspirates are unacceptable for Xpert Xpress SARS-CoV-2/FLU/RSV testing.  Fact Sheet for Patients: EntrepreneurPulse.com.au  Fact Sheet for Healthcare Providers: IncredibleEmployment.be  This test is  not yet approved or cleared by the [place] FDA and has been authorized for detection and/or diagnosis of SARS-CoV-2 by FDA under an Emergency Use Authorization (EUA). This EUA will remain in effect (meaning this test can be used) for the duration of the COVID-19 declaration under Section 564(b)(1) of the Act, 21 U.S.C. section 360bbb-3(b)(1), unless the authorization is terminated or revoked.  Performed at [facility], [place] [address], [place], [place] [address]   Aerobic/Anaerobic Culture (surgical/deep wound)     Status: None   Collection Time: 08/09/20  3:51 PM   Specimen: Abscess  Result Value Ref Range Status   Specimen Description   Final    ABSCESS Performed at [place] [address], [place], [place] [address]    Special Requests  Final    NONE Performed at [facility], [place] [address] [place], [place] [address]    Gram Stain   Final    NO WBC SEEN ABUNDANT GRAM POSITIVE COCCI ABUNDANT GRAM NEGATIVE RODS ABUNDANT GRAM POSITIVE RODS ABUNDANT GRAM VARIABLE ROD Performed at [facility], [place] [address] [place], [place] [address]    Culture   Final    MODERATE ACTINOMYCES ODONTOLYTICUS RARE ESCHERICHIA COLI MIXED ANAEROBIC FLORA PRESENT.  CALL LAB IF FURTHER IID REQUIRED.    Report Status 08/15/2020 FINAL  Final   Organism ID, Bacteria ESCHERICHIA COLI  Final      Susceptibility   Escherichia coli - MIC*    AMPICILLIN 4 SENSITIVE Sensitive     CEFAZOLIN <=4 SENSITIVE Sensitive     CEFEPIME <=0.12 SENSITIVE Sensitive     CEFTAZIDIME <=1 SENSITIVE Sensitive     CEFTRIAXONE <=0.25 SENSITIVE Sensitive     CIPROFLOXACIN <=0.25 SENSITIVE Sensitive     GENTAMICIN <=1 SENSITIVE Sensitive     IMIPENEM <=0.25 SENSITIVE Sensitive     TRIMETH/SULFA <=20 SENSITIVE Sensitive     AMPICILLIN/SULBACTAM <=2 SENSITIVE Sensitive     PIP/TAZO <=4 SENSITIVE Sensitive     * RARE ESCHERICHIA COLI  MRSA PCR  Screening     Status: None   Collection Time: 08/11/20  4:33 PM   Specimen: Nasal Mucosa; Nasopharyngeal  Result Value Ref Range Status   MRSA by PCR NEGATIVE NEGATIVE Final    Comment:        The GeneXpert MRSA Assay (FDA approved for NASAL specimens only), is one component of a comprehensive MRSA colonization surveillance program. It is not intended to diagnose MRSA infection nor to guide or monitor treatment for MRSA infections. Performed at [facility], [place] [address] [place], [place] [address]       Radiology Studies: No results found.  Scheduled Meds: . allopurinol  100 mg Oral Daily  . amLODipine  10 mg Oral Daily  . Chlorhexidine Gluconate Cloth  6 each Topical Daily  . fluticasone furoate-vilanterol  1 puff Inhalation Daily  . heparin  5,000 Units Subcutaneous Q8H  . isosorbide-hydrALAZINE  1 tablet Oral BID  . mouth rinse  15 mL Mouth Rinse BID  . metoprolol tartrate  12.5 mg Oral BID  . pravastatin  40 mg Oral q1800  . sodium chloride flush  10-40 mL Intracatheter Q12H  . sodium chloride flush  5 mL Intracatheter Q8H  . torsemide  20 mg Oral Daily   Continuous Infusions: . sodium chloride    . ampicillin-sulbactam (UNASYN) IV 3 g (08/15/20 1256)     LOS: 6 days   Time spent: 35 minutes.  Kathie Dike, MD [facility] [email or web address] 08/15/2020, 4:14 PM

## 2020-08-15 NOTE — Progress Notes (Incomplete)
PROGRESS NOTE   Laurie STRICKER  UXL:[id number] DOB: [birth year] DOA: 08/09/2020 PCP: [name], MD  Brief Narrative:  80 year old white female History recurrent AVM/GI bleed 2016 COPD (2 L chronic O2) Atrial fibrillation CHADS2 score >3 (no anticoagulation secondary to GI bleed) HFpEF EF 60-65% grade 2 dysfunction on 07/2017 hospitalization class III symptoms Large umbilical hernia, gout, HTN, iron deficiency anemia  Hospitalized last 10/9 through 05/12/2020 with recurrent GI bleed needing 2 units PRBC-no operative management at that time  Admitted 08/09/2020 3-week history abdominal pain-ventral hernia grew in size-reported severe pain with light touch-developed constipation no passage of stool-CT scan showed incarcerated transverse colon/appendix into hernia with air-fluid level and area of perforation General surgery was consulted IR was consulted  Patient found to be COVID-positive on admission  Cardiology/pulmonology saw the patient and cleared patient for surgery-underwent eventual surgery 1/13 ex lap removal of mesenteric mass and colostomy placement  Assessment & Plan:   Principal Problem:   Irreducible ventral hernia Active Problems:   Morbid obesity (HCC)   HTN (hypertension)   Chronic kidney disease, stage 3b (HCC)   CHF (congestive heart failure), NYHA class III, chronic, diastolic (HCC)   Infection as cause of abscess of colon   COPD (chronic obstructive pulmonary disease) ([place])   Shock circulatory ([place])   1. Incarcerated ventral hernia status postsurgery 1/13 with mesenteric mass 2. COVID-positive state on admission 3. COPD on 2 L baseline 4. HFpEF EF 60-65% class III symptoms, hypertension 5. Atrial fibrillation CHADS2 score >4 not on anticoagulation secondary to recurrent AVM bleeding 6. Iron deficiency anemia 7. Gout 8.   DVT prophylaxis:  Code Status:  Family Communication:  Disposition:  Status is: Inpatient  {Inpatient:23812}  Dispo: The  patient is from: {From:23814}              Anticipated d/c is to: {To:23815}              Anticipated d/c date is: {Days:23816}              Patient currently {Medically stable:23817}       Consultants:   ***  Procedures: ***  Antimicrobials: ***    Subjective: ***  Objective: Vitals:   08/14/20 2100 08/14/20 2130 08/15/20 0000 08/15/20 0400  BP: (!) 180/58 (!) 180/58 (!) 167/39 (!) 186/55  Pulse:  78 61 68  Resp:   12 14  Temp:   97.7 F (36.5 C) 98.2 F (36.8 C)  TempSrc:   Oral Oral  SpO2:   96% 97%  Weight:      Height:        Intake/Output Summary (Last 24 hours) at 08/15/2020 0744 Last data filed at 08/15/2020 0400 Gross per 24 hour  Intake 539.38 ml  Output 1665 ml  Net -1125.62 ml   Filed Weights   08/08/20 1413 08/09/20 2329  Weight: 83 kg 86.1 kg    Examination:    Data Reviewed: I have personally reviewed following labs and imaging studies  COVID-19 Labs  Recent Labs    08/13/20 0251  DDIMER 10.96*  FERRITIN 119  CRP 10.3*    Lab Results  Component Value Date   SARSCOV2NAA POSITIVE (A) 08/09/2020   [place] NEGATIVE 05/07/2020     Radiology Studies: No results found.   Scheduled Meds: . allopurinol  100 mg Oral Daily  . amLODipine  10 mg Oral Daily  . Chlorhexidine Gluconate Cloth  6 each Topical Daily  . fluticasone furoate-vilanterol  1 puff Inhalation Daily  .  heparin  5,000 Units Subcutaneous Q8H  . isosorbide-hydrALAZINE  1 tablet Oral BID  . mouth rinse  15 mL Mouth Rinse BID  . metoprolol tartrate  12.5 mg Oral BID  . pravastatin  40 mg Oral q1800  . sodium chloride flush  10-40 mL Intracatheter Q12H  . sodium chloride flush  5 mL Intracatheter Q8H  . torsemide  20 mg Oral Daily   Continuous Infusions: . sodium chloride    . piperacillin-tazobactam (ZOSYN)  IV Stopped (08/15/20 0540)     LOS: 6 days    Time spent: ***  Nita Sells, MD [facility] To contact the attending provider  between 7A-7P or the covering provider during after hours 7P-7A, please log into the web site [email or web address] and access using universal [place] password for that web site. If you do not have the password, please call the hospital operator.  08/15/2020, 7:44 AM

## 2020-08-15 NOTE — Plan of Care (Signed)

## 2020-08-15 NOTE — Progress Notes (Signed)
4 Days Post-Op   Subjective/Chief Complaint: Pt awake and alert  Complains of abdominal pain  Not eating much    Objective: Vital signs in last 24 hours: Temp:  [97.5 F (36.4 C)-98.2 F (36.8 C)] 98.2 F (36.8 C) (01/17 0400) Pulse Rate:  [59-78] 68 (01/17 0400) Resp:  [12-17] 14 (01/17 0400) BP: (154-228)/(35-66) 186/55 (01/17 0400) SpO2:  [91 %-97 %] 97 % (01/17 0400) Last BM Date: 08/15/20  Intake/Output from previous day: 01/16 0701 - 01/17 0700 In: 539.4 [P.O.:340; IV Piggyback:199.4] Out: 1665 [Urine:1550; Stool:115] Intake/Output this shift: No intake/output data recorded.  General appearance: alert Cardio: NSR Incision/Wound:OPEN  CLEAN OSTOMY VIABLE AND FUNCTIONING   Lab Results:  Recent Labs    08/14/20 0625 08/15/20 0500  WBC 15.1* 11.8*  HGB 8.8* 7.9*  HCT 28.0* 24.9*  PLT 291 250   BMET Recent Labs    08/14/20 0625 08/15/20 0500  NA 139 141  K 3.9 3.8  CL 103 104  CO2 25 25  GLUCOSE 84 102*  BUN 61* 59*  CREATININE 2.64* 2.29*  CALCIUM 8.6* 8.6*   PT/INR Recent Labs    08/13/20 0251  LABPROT 14.9  INR 1.2   ABG No results for input(s): PHART, HCO3 in the last 72 hours.  Invalid input(s): PCO2, PO2  Studies/Results: No results found.  Anti-infectives: Anti-infectives (From admission, onward)   Start     Dose/Rate Route Frequency Ordered Stop   08/12/20 1200  piperacillin-tazobactam (ZOSYN) IVPB 2.25 g        2.25 g 100 mL/hr over 30 Minutes Intravenous Every 6 hours 08/12/20 1033     08/10/20 1000  remdesivir 100 mg in sodium chloride 0.9 % 100 mL IVPB       "Followed by" Linked Group Details   100 mg 200 mL/hr over 30 Minutes Intravenous Daily 08/09/20 1045 08/13/20 1101   08/09/20 1200  remdesivir 200 mg in sodium chloride 0.9% 250 mL IVPB       "Followed by" Linked Group Details   200 mg 580 mL/hr over 30 Minutes Intravenous Once 08/09/20 1045 08/09/20 1430   08/09/20 0745  piperacillin-tazobactam (ZOSYN) IVPB 3.375 g   Status:  Discontinued        3.375 g 12.5 mL/hr over 240 Minutes Intravenous Every 8 hours 08/09/20 0735 08/12/20 1033   08/09/20 0315  piperacillin-tazobactam (ZOSYN) IVPB 3.375 g        3.375 g 100 mL/hr over 30 Minutes Intravenous  Once 08/09/20 0306 08/09/20 0515      Assessment/Plan: s/p Procedure(s): EXPLORATORY LAPAROTOMY PARTIAL COLECTOMY AND COLOSTOMY WITH RESECTION OF A MESSENTERIC MASS (N/A) CONTINUE to encourage po intake  Continue  wound care  Continue ABX  ?dispo- home vs SNF   LOS: 6 days    Joyice Faster Tynslee Bowlds 08/15/2020

## 2020-08-16 DIAGNOSIS — I5032 Chronic diastolic (congestive) heart failure: Secondary | ICD-10-CM | POA: Diagnosis not present

## 2020-08-16 DIAGNOSIS — N1832 Chronic kidney disease, stage 3b: Secondary | ICD-10-CM | POA: Diagnosis not present

## 2020-08-16 DIAGNOSIS — K436 Other and unspecified ventral hernia with obstruction, without gangrene: Secondary | ICD-10-CM | POA: Diagnosis not present

## 2020-08-16 DIAGNOSIS — K651 Peritoneal abscess: Secondary | ICD-10-CM | POA: Diagnosis not present

## 2020-08-16 LAB — CBC
HCT: 25.1 % — ABNORMAL LOW (ref 36.0–46.0)
Hemoglobin: 7.8 g/dL — ABNORMAL LOW (ref 12.0–15.0)
MCH: 24.9 pg — ABNORMAL LOW (ref 26.0–34.0)
MCHC: 31.1 g/dL (ref 30.0–36.0)
MCV: 80.2 fL (ref 80.0–100.0)
Platelets: 270 10*3/uL (ref 150–400)
RBC: 3.13 MIL/uL — ABNORMAL LOW (ref 3.87–5.11)
RDW: 15.5 % (ref 11.5–15.5)
WBC: 12.2 10*3/uL — ABNORMAL HIGH (ref 4.0–10.5)
nRBC: 0.2 % (ref 0.0–0.2)

## 2020-08-16 LAB — GLUCOSE, CAPILLARY: Glucose-Capillary: 129 mg/dL — ABNORMAL HIGH (ref 70–99)

## 2020-08-16 MED ORDER — ISOSORB DINITRATE-HYDRALAZINE 20-37.5 MG PO TABS
2.0000 | ORAL_TABLET | Freq: Two times a day (BID) | ORAL | Status: DC
  Administered 2020-08-16 – 2020-08-19 (×6): 2 via ORAL
  Filled 2020-08-16 (×7): qty 2

## 2020-08-16 MED ORDER — ENSURE ENLIVE PO LIQD
237.0000 mL | Freq: Two times a day (BID) | ORAL | Status: DC
  Administered 2020-08-16 – 2020-08-19 (×7): 237 mL via ORAL

## 2020-08-16 NOTE — Care Management Important Message (Signed)
Important Message  Patient Details IM Letter placed on Patient's door [place]. Name: [name] MRN: [id number] Date of Birth: 1941/07/04   Medicare Important Message Given:  Yes     Kerin Salen 08/16/2020, 1:55 PM

## 2020-08-16 NOTE — Progress Notes (Signed)
5 Days Post-Op  Subjective: CC: Patient reports pain around surgery site and wound. She is having a lot of pain with dressing changes. Not eating much. Reports she had some water yesterday but can't tell me anything else she ate. She is A&O x 4. She is having some stool output from colostomy. Foley out and has urine in cannister from [place]. Did not get out of bed yesterday.   Objective: Vital signs in last 24 hours: Temp:  [97.7 F (36.5 C)-98.8 F (37.1 C)] 97.8 F (36.6 C) (01/18 0520) Pulse Rate:  [60-72] 60 (01/18 0520) Resp:  [18-20] 18 (01/18 0520) BP: (145-175)/(59-74) 168/60 (01/18 0520) SpO2:  [97 %-98 %] 98 % (01/18 0520) Last BM Date: 08/15/20  Intake/Output from previous day: 01/17 0701 - 01/18 0700 In: 34 [P.O.:360; IV Piggyback:100] Out: 910 [Urine:650; Stool:260] Intake/Output this shift: Total I/O In: 240 [P.O.:240] Out: -   PE: Gen:  Alert, NAD, pleasant Card:  Reg Pulm:  Distant at the bases b/l. Clear upper lung fields. Normal rate and effort. On o2.  Abd: Soft, ND, tenderness around upper abdomen and wound. Colostomy bag with some stool in bag. Stoma pink, budded and viable. Wound with healthy granulation tissue at the base. No drainage. Periwound clean. See picture below (quality diminished as it was taken through plastic bag) Ext:  Some non-pitting edema to b/l LE's Psych: A&Ox4     Lab Results:  Recent Labs    08/15/20 0500 08/16/20 0345  WBC 11.8* 12.2*  HGB 7.9* 7.8*  HCT 24.9* 25.1*  PLT 250 270   BMET Recent Labs    08/15/20 0500 08/15/20 1819  NA 141 140  K 3.8 3.5  CL 104 104  CO2 25 25  GLUCOSE 102* 112*  BUN 59* 52*  CREATININE 2.29* 2.06*  CALCIUM 8.6* 8.6*   PT/INR No results for input(s): LABPROT, INR in the last 72 hours. CMP     Component Value Date/Time   NA 140 08/15/2020 1819   NA 142 04/11/2016 1007   K 3.5 08/15/2020 1819   K 3.3 (L) 04/11/2016 1007   CL 104 08/15/2020 1819   CO2 25 08/15/2020  1819   CO2 31 (H) 04/11/2016 1007   GLUCOSE 112 (H) 08/15/2020 1819   GLUCOSE 78 04/11/2016 1007   BUN 52 (H) 08/15/2020 1819   BUN 24.4 04/11/2016 1007   CREATININE 2.06 (H) 08/15/2020 1819   CREATININE 1.36 (H) 02/04/2020 1443   CREATININE 1.1 04/11/2016 1007   CALCIUM 8.6 (L) 08/15/2020 1819   CALCIUM 9.8 04/11/2016 1007   PROT 5.3 (L) 08/13/2020 0251   PROT 6.9 04/11/2016 1007   PROT 7.5 04/11/2016 1007   ALBUMIN 2.2 (L) 08/15/2020 1819   ALBUMIN 3.0 (L) 04/11/2016 1007   AST 14 (L) 08/13/2020 0251   AST 12 04/11/2016 1007   ALT 11 08/13/2020 0251   ALT 12 04/11/2016 1007   ALKPHOS 40 08/13/2020 0251   ALKPHOS 82 04/11/2016 1007   BILITOT 0.5 08/13/2020 0251   BILITOT 0.35 04/11/2016 1007   GFRNONAA 24 (L) 08/15/2020 1819   GFRAA 30 (L) 02/19/2020 1359   Lipase     Component Value Date/Time   LIPASE 35 08/08/2020 1756       Studies/Results: No results found.  Anti-infectives: Anti-infectives (From admission, onward)   Start     Dose/Rate Route Frequency Ordered Stop   08/15/20 1130  Ampicillin-Sulbactam (UNASYN) 3 g in sodium chloride 0.9 % 100 mL IVPB  3 g 200 mL/hr over 30 Minutes Intravenous Every 12 hours 08/15/20 1041     08/12/20 1200  piperacillin-tazobactam (ZOSYN) IVPB 2.25 g  Status:  Discontinued        2.25 g 100 mL/hr over 30 Minutes Intravenous Every 6 hours 08/12/20 1033 08/15/20 1041   08/10/20 1000  remdesivir 100 mg in sodium chloride 0.9 % 100 mL IVPB       "Followed by" Linked Group Details   100 mg 200 mL/hr over 30 Minutes Intravenous Daily 08/09/20 1045 08/13/20 1101   08/09/20 1200  remdesivir 200 mg in sodium chloride 0.9% 250 mL IVPB       "Followed by" Linked Group Details   200 mg 580 mL/hr over 30 Minutes Intravenous Once 08/09/20 1045 08/09/20 1430   08/09/20 0745  piperacillin-tazobactam (ZOSYN) IVPB 3.375 g  Status:  Discontinued        3.375 g 12.5 mL/hr over 240 Minutes Intravenous Every 8 hours 08/09/20 0735  08/12/20 1033   08/09/20 0315  piperacillin-tazobactam (ZOSYN) IVPB 3.375 g        3.375 g 100 mL/hr over 30 Minutes Intravenous  Once 08/09/20 0306 08/09/20 0515       Assessment/Plan H/O Ulcerative colitis  HTN A fib- not on anticoagulation Cor pulmonale Chronic combined CHF - last ECHO inOct 2021with EF 60-65% PVD COPD/O2 dependence HLD CKDstage III MGUS OSA Obesity- BMI 35.74 Gout  COVID positive- per primary, patient has received 2 doses of vaccine but has not had booster yet. Completed remdesivir   3.5 cm R renal mass- concern for renal cell carcinoma. Urology, Dr. Tresa Moore, has seen and suspectslikely non-metastatic slowly growing renal cancer.Believes this is very unlikelyto become clinically significant at her level of comorbidity. Additionally, curative therapy would have high probability of pushing her to [facility] she adamantly refuses dialysis.Herecommend watchful waiting with consideration of systemic palliative therapy should symptomatic / metastatic disease ever develop, whichhe believesis very unlikely.He did NOT recommend any concomitant kidney surgery at time of taking to OR.   Chronically incarcerated ventral hernia with mesenteric mass and transversae colon stricture  S/p Exploratory laparotomy, resection of mesenteric mass, partial colectomy, end colostomy - Dr. Ninfa Linden - 08/11/2020 - POD #5 - Continue FLD till eating more. Add ensure. Colostomy functioning. - WOCN consult for new colostomy - Continue WTD dressing changes BID to abdominal wound - Mobilize. PT/OT consult.  - Monitor wbc. Slightly elevated at 12.2 this AM. Afebrile. AM labs.  - Cont IV abx.  - Path back as noted below. No malignancy  - Pulm toilet. IS.  FEN - FLD, add ensure  VTE - SCDs, Heparin subq ID - Zosyn 1/11 - 1/17. Unasyn 1/17 >>  Foley - Out. [place] Follow-Up - Dr. Ninfa Linden  Path FINAL MICROSCOPIC DIAGNOSIS:  A. COLON, TRANSVERSE, WITH MESENTERIC MASS,  RESECTION:  - Three colonic tubular adenomas.  - No high-grade dysplasia or carcinoma.  - Fibromembranous connective tissue with acutely inflamed granulation  tissue consistent incarcerated hernia  - No evidence of malignancy.     LOS: 7 days    Jillyn Ledger , [facility] 08/16/2020, 9:43 AM Please see Amion for pager number during day hours 7:00am-4:30pm

## 2020-08-16 NOTE — Progress Notes (Signed)
Pt. Blood pressure 186/57 while sitting. T-97.5, pt. Is in no acute distress, no symptoms/complains. MD notified of the vitals, Hydralazine PRN given. Pt. Closely monitored

## 2020-08-16 NOTE — Consult Note (Addendum)
[place] Nurse ostomy consult note Surgical team following for assessment and plan of care for abd wound.  Pt had colostomy surgery on 1/13.  She is in isolation for Covid.  Stoma type/location: Stoma is red and viable, above skin level, 2 inches and edematous Peristomal assessment: intact skin surrounding Output: mod amt unformed brown stool in the pouch Ostomy pouching: 1pc flexible Kellie Simmering # [phone]) and barrier ring Kellie Simmering # [phone]) Education provided:  Discussed pouch appearance and function with patient.  She does not understand her bowel movements will be filling up the pouch, and she did not watch, participate, or understand the pouch change procedure. Pt will need total assistance with pouch application and emptying after discharge.  No family present today for teaching session; will await further information on discharge location before reaching out to family for future teaching sessions if she does not discharge to a SNF.  Applied barrier ring and one piece flexible pouch. 6 sets of barrier rings and pouches left at the bedside for staff nurse use; along with educational materials.  Enrolled patient in [place] program: Yes WOC will see again Ludwig Clarks for another pouch change and teaching session. Julien Girt MSN, RN, [place], [place], [place]

## 2020-08-16 NOTE — Progress Notes (Signed)
Daily Progress Note   Patient Name: [name]       Date: 08/16/2020 DOB: 1941-05-25  Age: 80 y.o. MRN#: [id number] Attending Physician: Caren Griffins, MD Primary Care Physician: [name], MD Admit Date: 08/09/2020  Reason for Consultation/Follow-up: Establishing goals of care  Subjective: I saw and examined [name] today.  She was awake and alert lying in bed.  Denies complaints today.  RN reports concerns with pain control.  I talked to [name] about this to see if we need to make changes to regimen.  We discussed frequency with which she is getting medication and question of consideration for scheduling some pain medication if she is not remembering to ask for it.  She reports being ok with current regimen at this time.  Length of Stay: 7  Current Medications: Scheduled Meds:  . allopurinol  100 mg Oral Daily  . amLODipine  10 mg Oral Daily  . Chlorhexidine Gluconate Cloth  6 each Topical Daily  . feeding supplement  237 mL Oral BID BM  . fluticasone furoate-vilanterol  1 puff Inhalation Daily  . heparin  5,000 Units Subcutaneous Q8H  . isosorbide-hydrALAZINE  1 tablet Oral BID  . mouth rinse  15 mL Mouth Rinse BID  . metoprolol tartrate  12.5 mg Oral BID  . pravastatin  40 mg Oral q1800  . sodium chloride flush  10-40 mL Intracatheter Q12H  . sodium chloride flush  5 mL Intracatheter Q8H  . torsemide  20 mg Oral Daily    Continuous Infusions: . sodium chloride    . ampicillin-sulbactam (UNASYN) IV Stopped (08/15/20 2300)    PRN Meds: sodium chloride, albuterol, hydrALAZINE, HYDROmorphone (DILAUDID) injection, labetalol, ondansetron **OR** ondansetron (ZOFRAN) IV, sodium chloride flush  Physical Exam         General: Alert, awake, in no significant  distress.  HEENT: No bruits, no goiter, no JVD Heart: Regular rate and rhythm.. Ext: No significant edema Skin: Warm and dry Neuro: Grossly intact, nonfocal.   Vital Signs: BP (!) 168/60 (BP Location: Left Arm)   Pulse 60   Temp 97.8 F (36.6 C) (Oral)   Resp 18   Ht 5' (1.524 m)   Wt 86.1 kg   SpO2 98%   BMI 37.07 kg/m  SpO2: SpO2: 98 % O2 Device:  O2 Device: Room Air O2 Flow Rate: O2 Flow Rate (L/min): 4 L/min  Intake/output summary:   Intake/Output Summary (Last 24 hours) at 08/16/2020 1050 Last data filed at 08/16/2020 4098 Gross per 24 hour  Intake 700 ml  Output 910 ml  Net -210 ml   LBM: Last BM Date: 08/15/20 Baseline Weight: Weight: 83 kg Most recent weight: Weight: 86.1 kg       Palliative Assessment/Data:      Patient Active Problem List   Diagnosis Date Noted  . Shock circulatory ([place])   . Irreducible ventral hernia 08/09/2020  . Infection as cause of abscess of colon 08/09/2020  . COPD (chronic obstructive pulmonary disease) ([place]) 08/09/2020  . Left lower quadrant abdominal tenderness without rebound tenderness 02/06/2020  . Chronic respiratory failure with hypoxia ([place]) 02/06/2020  . Routine general medical examination at a health care facility 02/06/2020  . Umbilical hernia with obstruction 08/27/2018  . AVM (arteriovenous malformation) of small bowel, acquired with hemorrhage 08/14/2018  . Hernia, ventral 08/12/2018  . Multiple lung nodules on CT 05/05/2018  . Stenosis of cervical spine with myelopathy ([place]) 01/01/2018  . Atrial fibrillation ([place]) 10/17/2017  . CHF (congestive heart failure), NYHA class III, chronic, diastolic ([place]) 11/91/4782  . Symptomatic anemia 08/10/2017  . Angiodysplasia of cecum   . Angiodysplasia of duodenum   . Grade III diastolic dysfunction 95/62/1308  . MGUS (monoclonal gammopathy of unknown significance) 11/08/2016  . Asteatotic eczema 02/25/2016  . COLD (chronic obstructive lung disease) ([place])   . Anemia,  iron deficiency 11/16/2014  . Cor pulmonale ([place]) 11/16/2014  . OSA (obstructive sleep apnea) 06/22/2013  . Chronic venous stasis dermatitis of both lower extremities 06/02/2013  . Vitamin D deficiency 02/06/2013  . Osteopenia, senile 02/05/2013  . Other screening mammogram 02/05/2013  . Chronic kidney disease, stage 3b ([place]) 06/20/2010  . Morbid obesity ([place]) 04/19/2010  . Gout 01/10/2009  . ULCERATIVE COLITIS-LEFT SIDE 03/19/2008  . Vitamin B12 deficiency anemia 11/13/2006  . Hyperlipidemia with target LDL less than 100 11/27/1996  . GERD 07/30/1992  . HTN (hypertension) 07/30/1988    Palliative Care Assessment & Plan   Patient Profile: 80 year old female with past medical history of COPD, CKD, CHF longstanding hernia admitted with incarceration of hernia and evidence of perforation.  Plan is for surgical intervention with understanding high risk of complications per patient preference to pursue surgical intervention.  Recommendations/Plan:  Full code/full scope  Doing well post surgery.  Palliative care will not continue to follow daily.    Please call if there are specific palliative care needs with which we can be of assistance.  Goals of Care and Additional Recommendations:  Limitations on Scope of Treatment: Full Scope Treatment  Code Status:    Code Status Orders  (From admission, onward)         Start     Ordered   08/09/20 0735  Full code  Continuous        08/09/20 0735        Code Status History    Date Active Date Inactive Code Status Order ID Comments User Context   05/07/2020 1401 05/12/2020 2028 Partial Code [id number]  Jonnie Finner, DO Inpatient   08/12/2018 1332 08/14/2018 1620 Full Code [id number]  Marcell Anger, MD Inpatient   08/04/2018 1522 08/06/2018 2146 DNR [id number]  Edwin Dada, MD ED   10/17/2017 2309 10/20/2017 2047 DNR [id number]  Rise Patience, MD ED   08/09/2017 0040 08/15/2017 1555 DNR [id number]  Elwin Mocha,  MD ED   11/08/2016 2026 11/11/2016 2001 Partial Code [id number]  Vianne Bulls, MD ED   03/19/2016 2109 03/22/2016 2157 Partial Code [id number]  Toy Baker, MD Inpatient   11/16/2014 1524 11/23/2014 1638 DNR [id number]  Samella Parr, NP Inpatient   06/01/2013 2001 06/05/2013 1502 Full Code [id number]  Aldean Jewett, MD ED   Advance Care Planning Activity       Discharge Planning:  To Be Determined  Care plan was discussed with patient, surgical team  Thank you for allowing the Palliative Medicine Team to assist in the care of this patient.   Time In: 1600 Time Out: 1620 Total Time 20 Prolonged Time Billed No   Greater than 50%  of this time was spent counseling and coordinating care related to the above assessment and plan.  Micheline Rough, MD  Please contact Palliative Medicine Team phone at [phone] for questions and concerns.

## 2020-08-16 NOTE — Evaluation (Signed)
Physical Therapy Evaluation Patient Details Name: [name] MRN: [id number] DOB: [birth year] Today's Date: 08/16/2020   History of Present Illness  80 y.o. female with a history of 2L O2-dependent COPD, stage IIIbCKD, chronic HFpEF, morbid obesity who presented to the ED with 3 weeks of worsening abdominal pain and enlargement of chronic ventral hernia found to have hernia incarceration with evidence of perforation (diverticular vs. appendix) as well as covid-19 pneumonia. IR performed aspiration and drain placement 1/11 which returned feculent material.  Patient was seen by general surgery and underwent operative management on 1/14.  Postoperatively, she developed hypotension requiring vasopressors and was transferred to the ICU.  Clinical Impression  Pt admitted with above diagnosis. Max assist supine to sit, +2 mod assist to stand, +2 max assist to pivot to recliner. Pt reported dizziness in sitting, BP 221/67. Pt denies h/o dizziness at baseline. Pt currently with functional limitations due to the deficits listed below (see PT Problem List). Pt will benefit from skilled PT to increase their independence and safety with mobility to allow discharge to the venue listed below.       Follow Up Recommendations SNF    Equipment Recommendations  Wheelchair cushion (measurements PT);Wheelchair (measurements PT)    Recommendations for Other Services       Precautions / Restrictions Precautions Precautions: Fall Precaution Comments: pt denies falls in past 1 year; abdominal surgery Restrictions Weight Bearing Restrictions: No      Mobility  Bed Mobility Overal bed mobility: Needs Assistance Bed Mobility: Supine to Sit     Supine to sit: Max assist     General bed mobility comments: assist to raise trunk and pivot hips to edge of bed (pt 40%). Pt reported dizziness in sitting, BP 221/67 sitting. SaO2 94% on 3L O2    Transfers Overall transfer level: Needs assistance Equipment  used: 2 person hand held assist Transfers: Sit to/from [facility] Sit to Stand: +2 physical assistance;Mod assist Stand pivot transfers: +2 physical assistance;Max assist       General transfer comment: assist to rise and steady, +2 assist 2* buckling of B knees in standing (there wasn't a RW in pt's room so did HHA of 2, found RW at end of session and placed it in pt's room)  Ambulation/Gait                Stairs            Wheelchair Mobility    Modified Rankin (Stroke Patients Only)       Balance Overall balance assessment: Needs assistance   Sitting balance-Leahy Scale: Fair     Standing balance support: Bilateral upper extremity supported Standing balance-Leahy Scale: Poor Standing balance comment: relies on BUE support 2* buckling of knees                             Pertinent Vitals/Pain Pain Assessment: 0-10 Pain Score: 8  Pain Location: abdomen Pain Descriptors / Indicators: Sore Pain Intervention(s): Limited activity within patient's tolerance;Monitored during session;RN gave pain meds during session    [place] expects to be discharged to:: Private residence Living Arrangements: Spouse/significant other;Children Available Help at Discharge: Family;Available 24 hours/day   Home Access: Stairs to enter   Entrance Stairs-Number of Steps: 1 Home Layout: One level Home Equipment: Cane - single point;Walker - 2 wheels      Prior Function Level of Independence: Independent with assistive device(s)  Comments: walks with cane, Independent with ADLs, drove PTA, disabled daughter lives with pt (pt assists with her meds, and with bathing)     Hand Dominance        Extremity/Trunk Assessment   Upper Extremity Assessment Upper Extremity Assessment: Generalized weakness (edema BUEs)    Lower Extremity Assessment Lower Extremity Assessment: Generalized weakness (B knee ext 3/5, edema  noted B lower legs/ankles)       Communication   Communication: No difficulties  Cognition Arousal/Alertness: Awake/alert Behavior During Therapy: WFL for tasks assessed/performed Overall Cognitive Status: Within Functional Limits for tasks assessed                                        General Comments      Exercises General Exercises - Lower Extremity Ankle Circles/Pumps: AROM;Both;10 reps;Supine   Assessment/Plan    PT Assessment Patient needs continued PT services  PT Problem List Decreased mobility;Decreased activity tolerance;Decreased balance;Pain       PT Treatment Interventions Therapeutic activities;Therapeutic exercise;Gait training;Functional mobility training;Balance training;Patient/family education    PT Goals (Current goals can be found in the Care Plan section)  Acute Rehab PT Goals Patient Stated Goal: to get sronger PT Goal Formulation: With patient Time For Goal Achievement: 08/30/20 Potential to Achieve Goals: Fair    Frequency Min 2X/week   Barriers to discharge        Co-evaluation               AM-PAC PT "6 Clicks" Mobility  Outcome Measure Help needed turning from your back to your side while in a flat bed without using bedrails?: A Lot Help needed moving from lying on your back to sitting on the side of a flat bed without using bedrails?: A Lot Help needed moving to and from a bed to a chair (including a wheelchair)?: A Lot Help needed standing up from a chair using your arms (e.g., wheelchair or bedside chair)?: A Lot Help needed to walk in hospital room?: Total Help needed climbing 3-5 steps with a railing? : Total 6 Click Score: 10    End of Session Equipment Utilized During Treatment: Gait belt;Oxygen Activity Tolerance: Patient limited by fatigue;Patient limited by pain Patient left: in chair;with call bell/phone within reach;with chair alarm set;with nursing/sitter in room Nurse Communication: Mobility  status PT Visit Diagnosis: Difficulty in walking, not elsewhere classified (R26.2);Muscle weakness (generalized) (M62.81)    Time: 0071-2197 PT Time Calculation (min) (ACUTE ONLY): 33 min   Charges:   PT Evaluation $PT Eval Moderate Complexity: 1 Mod PT Treatments $Therapeutic Activity: 8-22 mins        Blondell Reveal Kistler PT 08/16/2020  Acute Rehabilitation Services Pager [phone] Office [phone]

## 2020-08-16 NOTE — Progress Notes (Signed)
PROGRESS NOTE  [name]:[id number] DOB: 11-28-1940 DOA: 08/09/2020 PCP: [name], MD   LOS: 7 days   Brief Narrative / Interim history: 80 year old female with COPD with chronic oxygen at home, 2 L, CKD 3B, chronic diastolic CHF, morbid obesity who came into the hospital with 3 weeks of worsening abdominal pain, enlargement of her chronic ventral hernia.  She was found to have hernia incarceration with evidence of perforation, and also COVID-19 pneumonia.  Eventually underwent operative repair by general surgery on 1/14.  She was briefly in the ICU postoperatively as required pressors.  She is back to [facility] service on 1/17  Subjective / 24h Interval events: She is doing well this morning, overall feeling better.  No chest pain, no palpitations.  Assessment & Plan: Principal Problem Incarcerated ventral hernia, mesenteric mass, suspected colonic/appendiceal perforation -Initially IR consulted and had to placement on 1/11, eventually had operative management on 1/14 by general surgery with mass resection, colectomy/colostomy -Discussed with surgery today, no malignancy on pathology -Empirically on Unasyn, continue per surgery -Advance diet per surgery, today allow full liquids  Active Problems COVID-19 pneumonia, breakthrough case -Subtle infiltrates on chest x-ray.  Completed a course of remdesivir.  Respiratory status currently appears stable and at baseline  Chronic hypoxic respiratory failure, COPD -Chronically on a couple liters of oxygen at home, continue supplemental oxygen, on 2 L this morning -Continue bronchodilators  Chronic diastolic CHF -2D echo showed grade 2 diastolic dysfunction, preserved LVEF -Appears slightly fluid up with upper extremity swelling, continue torsemide  Anemia of critical illness -No bleeding, monitor, transfuse for hemoglobin less than 7  Acute kidney injury on CKD 3B -Baseline creatinine about 1.5, currently close to  baseline  Left renal mass -This will be addressed by urology as an outpatient  Hyperlipidemia -Continue statin  Essential hypertension -Continue isosorbide/hydralazine, amlodipine, torsemide.  Continue PRNs  Obesity -She would benefit from weight loss   Scheduled Meds: . allopurinol  100 mg Oral Daily  . amLODipine  10 mg Oral Daily  . Chlorhexidine Gluconate Cloth  6 each Topical Daily  . feeding supplement  237 mL Oral BID BM  . fluticasone furoate-vilanterol  1 puff Inhalation Daily  . heparin  5,000 Units Subcutaneous Q8H  . isosorbide-hydrALAZINE  1 tablet Oral BID  . mouth rinse  15 mL Mouth Rinse BID  . metoprolol tartrate  12.5 mg Oral BID  . pravastatin  40 mg Oral q1800  . sodium chloride flush  10-40 mL Intracatheter Q12H  . sodium chloride flush  5 mL Intracatheter Q8H  . torsemide  20 mg Oral Daily   Continuous Infusions: . sodium chloride    . ampicillin-sulbactam (UNASYN) IV Stopped (08/15/20 2300)   PRN Meds:.sodium chloride, albuterol, hydrALAZINE, HYDROmorphone (DILAUDID) injection, labetalol, ondansetron **OR** ondansetron (ZOFRAN) IV, sodium chloride flush  Diet Orders (From admission, onward)    Start     Ordered   08/15/20 1631  Diet full liquid Room service appropriate? Yes; Fluid consistency: Thin  Diet effective now       Question Answer Comment  Room service appropriate? Yes   Fluid consistency: Thin      08/15/20 1630          DVT prophylaxis: heparin injection 5,000 Units Start: 08/09/20 2200     Code Status: Full Code  Family Communication: no family present  Status is: Inpatient  Remains inpatient appropriate because:Inpatient level of care appropriate due to severity of illness   Dispo: The patient is  from: Home              Anticipated d/c is to: Home              Anticipated d/c date is: 3 days              Patient currently is not medically stable to d/c.  Consultants:  General surgery   Procedures:   Anterior  abdominal wall drain placement 60F pigtail drain 08/09/2020 Dr. Earleen Newport 1/14: EXPLORATORY LAPAROTOMY, RESECTION OF MESENTERIC MASS, PARTIAL COLECTOMY, END COLOSTOMY  Microbiology  Wound cultures-pansensitive E. coli  Antimicrobials: Unasyn     Objective: Vitals:   08/15/20 2229 08/16/20 0520 08/16/20 1052 08/16/20 1118  BP: (!) 153/59 (!) 168/60 (!) 221/67 (!) 186/57  Pulse: 65 60 68 62  Resp: 20 18  20   Temp: 98.6 F (37 C) 97.8 F (36.6 C)  (!) 97.5 F (36.4 C)  TempSrc: Oral Oral  Oral  SpO2: 98% 98% 94% 97%  Weight:      Height:        Intake/Output Summary (Last 24 hours) at 08/16/2020 1120 Last data filed at 08/16/2020 1100 Gross per 24 hour  Intake 700 ml  Output 960 ml  Net -260 ml   Filed Weights   08/08/20 1413 08/09/20 2329  Weight: 83 kg 86.1 kg    Examination:  Constitutional: NAD Eyes: no scleral icterus ENMT: Mucous membranes are moist.  Neck: normal, supple Respiratory: clear to auscultation bilaterally, no wheezing, no crackles. Normal respiratory effort. No accessory muscle use.  Cardiovascular: Regular rate and rhythm, no murmurs / rubs / gallops. No LE edema. Good peripheral pulses Abdomen: non distended, no tenderness. Bowel sounds positive.  Ostomy bag in place Musculoskeletal: no clubbing / cyanosis.  Skin: no rashes Neurologic: Nonfocal, equal strength  Data Reviewed: I have independently reviewed following labs and imaging studies   CBC: Recent Labs  Lab 08/12/20 0538 08/13/20 0032 08/14/20 0625 08/15/20 0500 08/16/20 0345  WBC 39.4* 13.7* 15.1* 11.8* 12.2*  HGB 7.5* 6.8* 8.8* 7.9* 7.8*  HCT 24.4* 21.4* 28.0* 24.9* 25.1*  MCV 81.6 79.6* 80.5 79.6* 80.2  PLT 476* 246 291 250 194   Basic Metabolic Panel: Recent Labs  Lab 08/12/20 0538 08/13/20 0251 08/14/20 0625 08/15/20 0500 08/15/20 1819  NA 138 137 139 141 140  K 3.8 3.5 3.9 3.8 3.5  CL 103 103 103 104 104  CO2 22 22 25 25 25   GLUCOSE 206* 129* 84 102* 112*  BUN  62* 64* 61* 59* 52*  CREATININE 2.67* 2.94* 2.64* 2.29* 2.06*  CALCIUM 8.4* 8.2* 8.6* 8.6* 8.6*  MG 2.1  --  2.3  --   --   PHOS 4.9* 4.2 3.6  --  2.7   Liver Function Tests: Recent Labs  Lab 08/10/20 0452 08/11/20 1847 08/13/20 0251 08/15/20 1819  AST 18 15 14*  --   ALT 14 12 11   --   ALKPHOS 51 46 40  --   BILITOT 0.4 0.5 0.5  --   PROT 6.0* 5.4* 5.3*  --   ALBUMIN 2.2* 2.0* 2.0* 2.2*   Coagulation Profile: Recent Labs  Lab 08/13/20 0251  INR 1.2   HbA1C: No results for input(s): HGBA1C in the last 72 hours. CBG: Recent Labs  Lab 08/16/20 0804  GLUCAP 129*    Recent Results (from the past 240 hour(s))  Resp Panel by RT-PCR (Flu A&B, Covid) Nasopharyngeal Swab     Status: Abnormal   Collection  Time: 08/09/20  4:41 AM   Specimen: Nasopharyngeal Swab; Nasopharyngeal(NP) swabs in vial transport medium  Result Value Ref Range Status   SARS Coronavirus 2 by RT PCR POSITIVE (A) NEGATIVE Final    Comment: RESULT CALLED TO, READ BACK BY AND VERIFIED WITH: GARRISON,G. RN AT 9379 08/09/20 MULLINS,T (NOTE) SARS-CoV-2 target nucleic acids are DETECTED.  The SARS-CoV-2 RNA is generally detectable in upper respiratory specimens during the acute phase of infection. Positive results are indicative of the presence of the identified virus, but do not rule out bacterial infection or co-infection with other pathogens not detected by the test. Clinical correlation with patient history and other diagnostic information is necessary to determine patient infection status. The expected result is Negative.  Fact Sheet for Patients: EntrepreneurPulse.com.au  Fact Sheet for Healthcare Providers: IncredibleEmployment.be  This test is not yet approved or cleared by the [place] FDA and  has been authorized for detection and/or diagnosis of SARS-CoV-2 by FDA under an Emergency Use Authorization (EUA).  This EUA will remain in effect (meaning  this tes t can be used) for the duration of  the COVID-19 declaration under Section 564(b)(1) of the Act, 21 U.S.C. section 360bbb-3(b)(1), unless the authorization is terminated or revoked sooner.     Influenza A by PCR NEGATIVE NEGATIVE Final   Influenza B by PCR NEGATIVE NEGATIVE Final    Comment: (NOTE) The Xpert Xpress SARS-CoV-2/FLU/RSV plus assay is intended as an aid in the diagnosis of influenza from Nasopharyngeal swab specimens and should not be used as a sole basis for treatment. Nasal washings and aspirates are unacceptable for Xpert Xpress SARS-CoV-2/FLU/RSV testing.  Fact Sheet for Patients: EntrepreneurPulse.com.au  Fact Sheet for Healthcare Providers: IncredibleEmployment.be  This test is not yet approved or cleared by the [place] FDA and has been authorized for detection and/or diagnosis of SARS-CoV-2 by FDA under an Emergency Use Authorization (EUA). This EUA will remain in effect (meaning this test can be used) for the duration of the COVID-19 declaration under Section 564(b)(1) of the Act, 21 U.S.C. section 360bbb-3(b)(1), unless the authorization is terminated or revoked.  Performed at [facility], [place] [address] [place], [place] [address]   Aerobic/Anaerobic Culture (surgical/deep wound)     Status: None   Collection Time: 08/09/20  3:51 PM   Specimen: Abscess  Result Value Ref Range Status   Specimen Description   Final    ABSCESS Performed at [place] [address], [place], [place] [address]    Special Requests   Final    NONE Performed at [facility], [place] [address], [place], [place] [address]    Gram Stain   Final    NO WBC SEEN ABUNDANT GRAM POSITIVE COCCI ABUNDANT GRAM NEGATIVE RODS ABUNDANT GRAM POSITIVE RODS ABUNDANT GRAM VARIABLE ROD Performed at [facility], [place] [address], [place],  [address]    Culture   Final     MODERATE ACTINOMYCES ODONTOLYTICUS RARE ESCHERICHIA COLI MIXED ANAEROBIC FLORA PRESENT.  CALL LAB IF FURTHER IID REQUIRED.    Report Status 08/15/2020 FINAL  Final   Organism ID, Bacteria ESCHERICHIA COLI  Final      Susceptibility   Escherichia coli - MIC*    AMPICILLIN 4 SENSITIVE Sensitive     CEFAZOLIN <=4 SENSITIVE Sensitive     CEFEPIME <=0.12 SENSITIVE Sensitive     CEFTAZIDIME <=1 SENSITIVE Sensitive     CEFTRIAXONE <=0.25 SENSITIVE Sensitive     CIPROFLOXACIN <=0.25  SENSITIVE Sensitive     GENTAMICIN <=1 SENSITIVE Sensitive     IMIPENEM <=0.25 SENSITIVE Sensitive     TRIMETH/SULFA <=20 SENSITIVE Sensitive     AMPICILLIN/SULBACTAM <=2 SENSITIVE Sensitive     PIP/TAZO <=4 SENSITIVE Sensitive     * RARE ESCHERICHIA COLI  MRSA PCR Screening     Status: None   Collection Time: 08/11/20  4:33 PM   Specimen: Nasal Mucosa; Nasopharyngeal  Result Value Ref Range Status   MRSA by PCR NEGATIVE NEGATIVE Final    Comment:        The GeneXpert MRSA Assay (FDA approved for NASAL specimens only), is one component of a comprehensive MRSA colonization surveillance program. It is not intended to diagnose MRSA infection nor to guide or monitor treatment for MRSA infections. Performed at [facility], [place] [address], [place], [place] [address]      Radiology Studies: No results found.  Marzetta Board, MD, PhD [facility]  Between 7 am - 7 pm I am available, please contact me via Amion or Securechat  Between 7 pm - 7 am I am not available, please contact night coverage MD/APP via Amion

## 2020-08-16 NOTE — Progress Notes (Signed)
   08/16/20 1402  Assess: MEWS Score  Temp 98.1 F (36.7 C)  BP (!) 203/62  Pulse Rate 75  Resp 20  SpO2 97 %  O2 Device Nasal Cannula  Patient Activity (if Appropriate) In chair  O2 Flow Rate (L/min) 2 L/min  Assess: MEWS Score  MEWS Temp 0  MEWS Systolic 2  MEWS Pulse 0  MEWS RR 0  MEWS LOC 0  MEWS Score 2  MEWS Score Color Yellow  Assess: if the MEWS score is Yellow or Red  Were vital signs taken at a resting state? Yes  Focused Assessment No change from prior assessment  Early Detection of Sepsis Score *See Row Information* Low  MEWS guidelines implemented *See Row Information* Yes  Treat  MEWS Interventions Administered prn meds/treatments  Pain Scale 0-10  Pain Score 3  Pain Type Surgical pain  Pain Location Abdomen  Pain Orientation Right;Left  Pain Descriptors / Indicators Aching  Pain Frequency Constant  Pain Onset On-going  Patients Stated Pain Goal 3  Pain Intervention(s) Medication (See eMAR);Rest  Multiple Pain Sites No  Take Vital Signs  Increase Vital Sign Frequency  Yellow: Q 2hr X 2 then Q 4hr X 2, if remains yellow, continue Q 4hrs  Escalate  MEWS: Escalate Yellow: discuss with charge nurse/RN and consider discussing with provider and RRT  Notify: Charge Nurse/RN  Name of Charge Nurse/RN Notified Apolonio Schneiders RN  Date Charge Nurse/RN Notified 08/16/20  Time Charge Nurse/RN Notified 1422  Notify: Provider  Provider Name/Title Dr. Renne Crigler  Date Provider Notified 08/16/20  Time Provider Notified 1405  Notification Type Page  Notification Reason Other (Comment) (yellow MEWS)  Response No new orders  Date of Provider Response 08/16/20  Time of Provider Response 3329  Document  Patient Outcome Stabilized after interventions  Progress note created (see row info) Yes    Pt. Was given PRN Hydralazine and Labetalol IV. Will continue to monitor.

## 2020-08-17 ENCOUNTER — Inpatient Hospital Stay (HOSPITAL_COMMUNITY): Payer: Medicare Other

## 2020-08-17 DIAGNOSIS — I5032 Chronic diastolic (congestive) heart failure: Secondary | ICD-10-CM | POA: Diagnosis not present

## 2020-08-17 DIAGNOSIS — R609 Edema, unspecified: Secondary | ICD-10-CM

## 2020-08-17 DIAGNOSIS — K436 Other and unspecified ventral hernia with obstruction, without gangrene: Secondary | ICD-10-CM | POA: Diagnosis not present

## 2020-08-17 DIAGNOSIS — K651 Peritoneal abscess: Secondary | ICD-10-CM | POA: Diagnosis not present

## 2020-08-17 DIAGNOSIS — N1832 Chronic kidney disease, stage 3b: Secondary | ICD-10-CM | POA: Diagnosis not present

## 2020-08-17 LAB — CBC
HCT: 25.8 % — ABNORMAL LOW (ref 36.0–46.0)
Hemoglobin: 8 g/dL — ABNORMAL LOW (ref 12.0–15.0)
MCH: 25.2 pg — ABNORMAL LOW (ref 26.0–34.0)
MCHC: 31 g/dL (ref 30.0–36.0)
MCV: 81.1 fL (ref 80.0–100.0)
Platelets: 248 10*3/uL (ref 150–400)
RBC: 3.18 MIL/uL — ABNORMAL LOW (ref 3.87–5.11)
RDW: 15.6 % — ABNORMAL HIGH (ref 11.5–15.5)
WBC: 12 10*3/uL — ABNORMAL HIGH (ref 4.0–10.5)
nRBC: 0.2 % (ref 0.0–0.2)

## 2020-08-17 LAB — BASIC METABOLIC PANEL
Anion gap: 10 (ref 5–15)
BUN: 39 mg/dL — ABNORMAL HIGH (ref 8–23)
CO2: 27 mmol/L (ref 22–32)
Calcium: 8.9 mg/dL (ref 8.9–10.3)
Chloride: 105 mmol/L (ref 98–111)
Creatinine, Ser: 1.44 mg/dL — ABNORMAL HIGH (ref 0.44–1.00)
GFR, Estimated: 37 mL/min — ABNORMAL LOW (ref >60)
Glucose, Bld: 156 mg/dL — ABNORMAL HIGH (ref 70–99)
Potassium: 3.4 mmol/L — ABNORMAL LOW (ref 3.5–5.1)
Sodium: 142 mmol/L (ref 135–145)

## 2020-08-17 MED ORDER — POTASSIUM CHLORIDE 20 MEQ PO PACK
40.0000 meq | PACK | Freq: Once | ORAL | Status: AC
  Administered 2020-08-17: 40 meq via ORAL
  Filled 2020-08-17: qty 2

## 2020-08-17 NOTE — Progress Notes (Signed)
Bilateral lower extremity venous duplex has been completed. Preliminary results can be found in CV Proc through chart review.   08/17/20 12:34 PM [name] RVT

## 2020-08-17 NOTE — NC FL2 (Signed)
[place] MEDICAID FL2 LEVEL OF CARE SCREENING TOOL     IDENTIFICATION  Patient Name: [name] Birthdate: 29-Jun-1941 Sex: female Admission Date (Current Location): 08/09/2020  [facility] and [place] Number:  Herbalist and Address:  [facility],  [place] [address], [place]      Provider Number: [id number]  Attending Physician Name and Address:  Caren Griffins, MD  Relative Name and Phone Number:  Jordanne, [place] (Spouse)   [phone]    Current Level of Care: Hospital Recommended Level of Care: [place] Prior Approval Number:    Date Approved/Denied:   PASRR Number: [id number]  Discharge Plan: SNF    Current Diagnoses: Patient Active Problem List   Diagnosis Date Noted  . Shock circulatory ([place])   . Irreducible ventral hernia 08/09/2020  . Infection as cause of abscess of colon 08/09/2020  . COPD (chronic obstructive pulmonary disease) ([place]) 08/09/2020  . Left lower quadrant abdominal tenderness without rebound tenderness 02/06/2020  . Chronic respiratory failure with hypoxia ([place]) 02/06/2020  . Routine general medical examination at a health care facility 02/06/2020  . Umbilical hernia with obstruction 08/27/2018  . AVM (arteriovenous malformation) of small bowel, acquired with hemorrhage 08/14/2018  . Hernia, ventral 08/12/2018  . Multiple lung nodules on CT 05/05/2018  . Stenosis of cervical spine with myelopathy ([place]) 01/01/2018  . Atrial fibrillation ([place]) 10/17/2017  . CHF (congestive heart failure), NYHA class III, chronic, diastolic ([place]) 11/57/2620  . Symptomatic anemia 08/10/2017  . Angiodysplasia of cecum   . Angiodysplasia of duodenum   . Grade III diastolic dysfunction 35/59/7416  . MGUS (monoclonal gammopathy of unknown significance) 11/08/2016  . Asteatotic eczema 02/25/2016  . COLD (chronic obstructive lung disease) ([place])   . Anemia, iron deficiency 11/16/2014  . Cor pulmonale ([place])  11/16/2014  . OSA (obstructive sleep apnea) 06/22/2013  . Chronic venous stasis dermatitis of both lower extremities 06/02/2013  . Vitamin D deficiency 02/06/2013  . Osteopenia, senile 02/05/2013  . Other screening mammogram 02/05/2013  . Chronic kidney disease, stage 3b ([place]) 06/20/2010  . Morbid obesity ([place]) 04/19/2010  . Gout 01/10/2009  . ULCERATIVE COLITIS-LEFT SIDE 03/19/2008  . Vitamin B12 deficiency anemia 11/13/2006  . Hyperlipidemia with target LDL less than 100 11/27/1996  . GERD 07/30/1992  . HTN (hypertension) 07/30/1988    Orientation RESPIRATION BLADDER Height & Weight     Self,Time,Situation,Place  O2 (2L [place]) External catheter Weight: 86.1 kg Height:  5' (152.4 cm)  BEHAVIORAL SYMPTOMS/MOOD NEUROLOGICAL BOWEL NUTRITION STATUS   (none)  (none) Colostomy Diet (see d/c summary)  AMBULATORY STATUS COMMUNICATION OF NEEDS Skin   Extensive Assist Verbally Other (Comment) (Incision: abdomen)                       Personal Care Assistance Level of Assistance  Bathing,Feeding,Dressing Bathing Assistance: Maximum assistance Feeding assistance: Independent Dressing Assistance: Maximum assistance     Functional Limitations Info  Sight,Hearing,Speech Sight Info: Adequate Hearing Info: Adequate Speech Info: Adequate    SPECIAL CARE FACTORS FREQUENCY  PT (By licensed PT),OT (By licensed OT)     PT Frequency: 5X/W OT Frequency: 5X/W            Contractures Contractures Info: Not present    Additional Factors Info  Code Status,Allergies Code Status Info: Full Allergies Info: Celebrex Amlodipine Besylate   Enalapril   Lipitor   Amoxicillin   Codeine Sulfate   Hydrocodone-acetaminophen  Current Medications (08/17/2020):  This is the current hospital active medication list Current Facility-Administered Medications  Medication Dose Route Frequency Provider Last Rate Last Admin  . 0.9 %  sodium chloride infusion   Intravenous PRN Chesley Mires,  MD      . albuterol (VENTOLIN HFA) 108 (90 Base) MCG/ACT inhaler 1-2 puff  1-2 puff Inhalation Q4H PRN Chesley Mires, MD      . allopurinol (ZYLOPRIM) tablet 100 mg  100 mg Oral Daily Chesley Mires, MD   100 mg at 08/17/20 0904  . amLODipine (NORVASC) tablet 10 mg  10 mg Oral Daily Chesley Mires, MD   10 mg at 08/17/20 0904  . Ampicillin-Sulbactam (UNASYN) 3 g in sodium chloride 0.9 % 100 mL IVPB  3 g Intravenous Q12H Nita Sells, MD 200 mL/hr at 08/17/20 1228 3 g at 08/17/20 1228  . Chlorhexidine Gluconate Cloth 2 % PADS 6 each  6 each Topical Daily Chesley Mires, MD   6 each at 08/16/20 1042  . feeding supplement (ENSURE ENLIVE / ENSURE PLUS) liquid 237 mL  237 mL Oral BID BM Jillyn Ledger, PA-C   237 mL at 08/17/20 0910  . fluticasone furoate-vilanterol (BREO ELLIPTA) 100-25 MCG/INH 1 puff  1 puff Inhalation Daily Chesley Mires, MD   1 puff at 08/17/20 0905  . heparin injection 5,000 Units  5,000 Units Subcutaneous Q8H Chesley Mires, MD   5,000 Units at 08/17/20 0609  . hydrALAZINE (APRESOLINE) injection 10 mg  10 mg Intravenous Q6H PRN Kathie Dike, MD   10 mg at 08/16/20 1125  . HYDROmorphone (DILAUDID) injection 0.5-1 mg  0.5-1 mg Intravenous Q2H PRN Chesley Mires, MD   1 mg at 08/17/20 0909  . isosorbide-hydrALAZINE (BIDIL) 20-37.5 MG per tablet 2 tablet  2 tablet Oral BID Caren Griffins, MD   2 tablet at 08/17/20 0904  . labetalol (NORMODYNE) injection 10 mg  10 mg Intravenous Q4H PRN Chesley Mires, MD   10 mg at 08/16/20 1430  . MEDLINE mouth rinse  15 mL Mouth Rinse BID Chesley Mires, MD   15 mL at 08/17/20 0905  . metoprolol tartrate (LOPRESSOR) tablet 12.5 mg  12.5 mg Oral BID Chesley Mires, MD   12.5 mg at 08/17/20 0903  . ondansetron (ZOFRAN) tablet 4 mg  4 mg Oral Q6H PRN Chesley Mires, MD       Or  . ondansetron (ZOFRAN) injection 4 mg  4 mg Intravenous Q6H PRN Chesley Mires, MD   4 mg at 08/09/20 0816  . pravastatin (PRAVACHOL) tablet 40 mg  40 mg Oral q1800 Chesley Mires, MD    40 mg at 08/16/20 1812  . sodium chloride flush (NS) 0.9 % injection 10-40 mL  10-40 mL Intracatheter Q12H Chesley Mires, MD   10 mL at 08/17/20 0905  . sodium chloride flush (NS) 0.9 % injection 10-40 mL  10-40 mL Intracatheter PRN Chesley Mires, MD      . sodium chloride flush (NS) 0.9 % injection 5 mL  5 mL Intracatheter Q8H Chesley Mires, MD   5 mL at 08/16/20 0603  . torsemide (DEMADEX) tablet 20 mg  20 mg Oral Daily Chesley Mires, MD   20 mg at 08/17/20 7416     Discharge Medications: Please see discharge summary for a list of discharge medications.  Relevant Imaging Results:  Relevant Lab Results:   Additional Information [phone] [place], [place]

## 2020-08-17 NOTE — Progress Notes (Addendum)
6 Days Post-Op  Subjective: CC: Reports she is doing well today. Most of her pain is around her wound and with dressing changes. She notes they just changed it this morning. She finished most of her fld tray this morning. No n/v. She is having ostomy output. Voiding. PT recommending SNF. Seen by WOCN yesterday. On o2. No CP or SOB. Noted cough.   Objective: Vital signs in last 24 hours: Temp:  [97.5 F (36.4 C)-98.2 F (36.8 C)] 98.2 F (36.8 C) (01/19 0210) Pulse Rate:  [62-77] 69 (01/19 0210) Resp:  [16-24] 16 (01/19 0210) BP: (153-221)/(56-128) 161/59 (01/19 0210) SpO2:  [94 %-98 %] 94 % (01/19 0210) Last BM Date: 08/16/20  Intake/Output from previous day: 01/18 0701 - 01/19 0700 In: 766.4 [P.O.:600; IV Piggyback:166.4] Out: 1160 [Urine:850; Stool:310] Intake/Output this shift: No intake/output data recorded.  PE: Gen:  Alert, NAD, pleasant Card:  Reg rate, irregular rhythm  Pulm:  Distant at the bases b/l. Clear upper lung fields. Normal rate and effort. On o2.  Abd: Soft, ND, tenderness around lower abdominal wound. Colostomy bag with stool in bag. Stoma pink, budded and viable. Wound dressing clean and dry. Just changed this am. Periwound clean. See picture from yesterday (quality diminished as it was taken through plastic bag), will check again tomorrow.  Ext: 1+ LE edema b/l Psych: A&Ox4  Lab Results:  Recent Labs    08/16/20 0345 08/17/20 0402  WBC 12.2* 12.0*  HGB 7.8* 8.0*  HCT 25.1* 25.8*  PLT 270 248   BMET Recent Labs    08/15/20 1819 08/17/20 0402  NA 140 142  K 3.5 3.4*  CL 104 105  CO2 25 27  GLUCOSE 112* 156*  BUN 52* 39*  CREATININE 2.06* 1.44*  CALCIUM 8.6* 8.9   PT/INR No results for input(s): LABPROT, INR in the last 72 hours. CMP     Component Value Date/Time   NA 142 08/17/2020 0402   NA 142 04/11/2016 1007   K 3.4 (L) 08/17/2020 0402   K 3.3 (L) 04/11/2016 1007   CL 105 08/17/2020 0402   CO2 27 08/17/2020 0402   CO2 31  (H) 04/11/2016 1007   GLUCOSE 156 (H) 08/17/2020 0402   GLUCOSE 78 04/11/2016 1007   BUN 39 (H) 08/17/2020 0402   BUN 24.4 04/11/2016 1007   CREATININE 1.44 (H) 08/17/2020 0402   CREATININE 1.36 (H) 02/04/2020 1443   CREATININE 1.1 04/11/2016 1007   CALCIUM 8.9 08/17/2020 0402   CALCIUM 9.8 04/11/2016 1007   PROT 5.3 (L) 08/13/2020 0251   PROT 6.9 04/11/2016 1007   PROT 7.5 04/11/2016 1007   ALBUMIN 2.2 (L) 08/15/2020 1819   ALBUMIN 3.0 (L) 04/11/2016 1007   AST 14 (L) 08/13/2020 0251   AST 12 04/11/2016 1007   ALT 11 08/13/2020 0251   ALT 12 04/11/2016 1007   ALKPHOS 40 08/13/2020 0251   ALKPHOS 82 04/11/2016 1007   BILITOT 0.5 08/13/2020 0251   BILITOT 0.35 04/11/2016 1007   GFRNONAA 37 (L) 08/17/2020 0402   GFRAA 30 (L) 02/19/2020 1359   Lipase     Component Value Date/Time   LIPASE 35 08/08/2020 1756       Studies/Results: No results found.  Anti-infectives: Anti-infectives (From admission, onward)   Start     Dose/Rate Route Frequency Ordered Stop   08/15/20 1130  Ampicillin-Sulbactam (UNASYN) 3 g in sodium chloride 0.9 % 100 mL IVPB        3 g 200  mL/hr over 30 Minutes Intravenous Every 12 hours 08/15/20 1041     08/12/20 1200  piperacillin-tazobactam (ZOSYN) IVPB 2.25 g  Status:  Discontinued        2.25 g 100 mL/hr over 30 Minutes Intravenous Every 6 hours 08/12/20 1033 08/15/20 1041   08/10/20 1000  remdesivir 100 mg in sodium chloride 0.9 % 100 mL IVPB       "Followed by" Linked Group Details   100 mg 200 mL/hr over 30 Minutes Intravenous Daily 08/09/20 1045 08/13/20 1101   08/09/20 1200  remdesivir 200 mg in sodium chloride 0.9% 250 mL IVPB       "Followed by" Linked Group Details   200 mg 580 mL/hr over 30 Minutes Intravenous Once 08/09/20 1045 08/09/20 1430   08/09/20 0745  piperacillin-tazobactam (ZOSYN) IVPB 3.375 g  Status:  Discontinued        3.375 g 12.5 mL/hr over 240 Minutes Intravenous Every 8 hours 08/09/20 0735 08/12/20 1033   08/09/20  0315  piperacillin-tazobactam (ZOSYN) IVPB 3.375 g        3.375 g 100 mL/hr over 30 Minutes Intravenous  Once 08/09/20 0306 08/09/20 0515       Assessment/Plan H/O Ulcerative colitis  HTN A fib- not on anticoagulation Cor pulmonale Chronic combined CHF - last ECHO inOct 2021with EF 60-65% PVD COPD/O2 dependence HLD CKDstage III - Cr down from 2.06 > 1.44 MGUS OSA Obesity- BMI 35.74 Gout ABL anemia - hgb stable at 8  COVID positive- per primary, patient has received 2 doses of vaccine but has not had booster yet.Completed remdesivir   3.5 cm R renal mass- concern for renal cell carcinoma. Urology, Dr. Tresa Moore, has seen and suspectslikely non-metastatic slowly growing renal cancer.Believes this is very unlikelyto become clinically significant at her level of comorbidity. Additionally, curative therapy would have high probability of pushing her to [facility] she adamantly refuses dialysis.Herecommend watchful waiting with consideration of systemic palliative therapy should symptomatic / metastatic disease ever develop, whichhe believesis very unlikely.He did NOT recommend any concomitant kidney surgery at time of taking to OR.   Chronically incarcerated ventral hernia with mesenteric mass and transversae colon stricture  S/p Exploratory laparotomy, resection of mesenteric mass, partial colectomy, end colostomy - Dr. Ninfa Linden - 08/11/2020 - POD #6 - Adv to soft diet. Colostomy functioning. - WOCN following for new colostomy - Continue WTD dressing changes BID to abdominal wound - Mobilize. PT/OT. Recommending SNF  - Monitor wbc. Stable at 12.0 this AM. Afebrile. AM labs.  - Cont IV abx. Will discuss with MD duration.  - Path back as noted below. No malignancy  - Pulm toilet. IS.  FEN - Soft, ensure  VTE - SCDs, Heparin subq. Checking LE US's ID - Zosyn 1/11 - 1/17. Unasyn 1/17 >> WBC 12.0, afebrile.  Foley - Out. [place] Follow-Up - Dr.  Ninfa Linden  Path FINAL MICROSCOPIC DIAGNOSIS:  A. COLON, TRANSVERSE, WITH MESENTERIC MASS, RESECTION:  - Three colonic tubular adenomas.  - No high-grade dysplasia or carcinoma.  - Fibromembranous connective tissue with acutely inflamed granulation  tissue consistent incarcerated hernia  - No evidence of malignancy.    LOS: 8 days    Jillyn Ledger , [facility] 08/17/2020, 8:55 AM Please see Amion for pager number during day hours 7:00am-4:30pm

## 2020-08-17 NOTE — Progress Notes (Signed)
PROGRESS NOTE  [name] SJG:[id number] DOB: 1941-03-06 DOA: 08/09/2020 PCP: [name], MD   LOS: 8 days   Brief Narrative / Interim history: 80 year old female with COPD with chronic oxygen at home, 2 L, CKD 3B, chronic diastolic CHF, morbid obesity who came into the hospital with 3 weeks of worsening abdominal pain, enlargement of her chronic ventral hernia.  She was found to have hernia incarceration with evidence of perforation, and also COVID-19 pneumonia.  Eventually underwent operative repair by general surgery on 1/14.  She was briefly in the ICU postoperatively as required pressors.  She is back to [facility] service on 1/17  Subjective / 24h Interval events: Feeling weak shortness but better.  Assessment & Plan: Principal Problem Incarcerated ventral hernia, mesenteric mass, suspected colonic/appendiceal perforation -Initially IR consulted and had to placement on 1/11, eventually had operative management on 1/14 by general surgery with mass resection, colectomy/colostomy -Discussed with surgery, no malignancy on pathology -Empirically on Unasyn, continue per surgery - Per surgery  Active Problems COVID-19 pneumonia, breakthrough case -Subtle infiltrates on chest x-ray.  Completed a course of remdesivir.  Respiratory status currently appears stable and at baseline.  Chronic hypoxic respiratory failure, COPD -Chronically on a couple liters of oxygen at home, continue supplemental oxygen, on 2 L this morning -Continue bronchodilators.  Respiratory status stable  Chronic diastolic CHF -2D echo showed grade 2 diastolic dysfunction, preserved LVEF -Appears slightly fluid up with upper extremity swelling, on torsemide.  With fluids  Anemia of critical illness -No bleeding, monitor, transfuse for hemoglobin less than 7  Acute kidney injury on CKD 3B -Baseline creatinine about 1.5, currently close to baseline  Left renal mass -This will be addressed by urology as an  outpatient  Hyperlipidemia -Continue statin  Essential hypertension -Continue isosorbide/hydralazine, amlodipine, torsemide.  Continue PRNs.  Blood pressure shortness of breath  Obesity -She would benefit from weight loss   Scheduled Meds: . allopurinol  100 mg Oral Daily  . amLODipine  10 mg Oral Daily  . Chlorhexidine Gluconate Cloth  6 each Topical Daily  . feeding supplement  237 mL Oral BID BM  . fluticasone furoate-vilanterol  1 puff Inhalation Daily  . heparin  5,000 Units Subcutaneous Q8H  . isosorbide-hydrALAZINE  2 tablet Oral BID  . mouth rinse  15 mL Mouth Rinse BID  . metoprolol tartrate  12.5 mg Oral BID  . pravastatin  40 mg Oral q1800  . sodium chloride flush  10-40 mL Intracatheter Q12H  . sodium chloride flush  5 mL Intracatheter Q8H  . torsemide  20 mg Oral Daily   Continuous Infusions: . sodium chloride    . ampicillin-sulbactam (UNASYN) IV 3 g (08/17/20 1228)   PRN Meds:.sodium chloride, albuterol, hydrALAZINE, HYDROmorphone (DILAUDID) injection, labetalol, ondansetron **OR** ondansetron (ZOFRAN) IV, sodium chloride flush  Diet Orders (From admission, onward)    Start     Ordered   08/17/20 0855  DIET SOFT Room service appropriate? Yes; Fluid consistency: Thin  Diet effective now       Question Answer Comment  Room service appropriate? Yes   Fluid consistency: Thin      08/17/20 0854          DVT prophylaxis: heparin injection 5,000 Units Start: 08/09/20 2200     Code Status: Full Code  Family Communication: no family present  Status is: Inpatient  Remains inpatient appropriate because:Inpatient level of care appropriate due to severity of illness   Dispo: The patient is from: Home  Anticipated d/c is to: Home              Anticipated d/c date is: 3 days              Patient currently is not medically stable to d/c.  Consultants:  General surgery   Procedures:   Anterior abdominal wall drain placement 8F pigtail  drain 08/09/2020 Dr. Earleen Newport 1/14: EXPLORATORY LAPAROTOMY, RESECTION OF MESENTERIC MASS, PARTIAL COLECTOMY, END COLOSTOMY  Microbiology  Wound cultures-pansensitive E. coli  Antimicrobials: Unasyn     Objective: Vitals:   08/16/20 1628 08/16/20 1806 08/16/20 2203 08/17/20 0210  BP: (!) 158/59 (!) 153/61 (!) 162/56 (!) 161/59  Pulse: 67 70 77 69  Resp:  (!) 24 16 16   Temp:  97.9 F (36.6 C) (!) 97.5 F (36.4 C) 98.2 F (36.8 C)  TempSrc:      SpO2:  98% 94% 94%  Weight:      Height:        Intake/Output Summary (Last 24 hours) at 08/17/2020 1331 Last data filed at 08/17/2020 0530 Gross per 24 hour  Intake 406.4 ml  Output 1110 ml  Net -703.6 ml   Filed Weights   08/08/20 1413 08/09/20 2329  Weight: 83 kg 86.1 kg    Examination:  Constitutional: No distress Eyes: No icterus ENMT: mmm Neck: normal, supple Respiratory: Clear bilaterally no wheezing no crackles Cardiovascular: Regular rate and rhythm, no murmurs, no peripheral edema Abdomen: Soft, nondistended, bowel sounds positive ostomy bag in place Musculoskeletal: no clubbing / cyanosis.  Skin: No rashes seen Neurologic: No focal deficits  Data Reviewed: I have independently reviewed following labs and imaging studies   CBC: Recent Labs  Lab 08/13/20 0032 08/14/20 0625 08/15/20 0500 08/16/20 0345 08/17/20 0402  WBC 13.7* 15.1* 11.8* 12.2* 12.0*  HGB 6.8* 8.8* 7.9* 7.8* 8.0*  HCT 21.4* 28.0* 24.9* 25.1* 25.8*  MCV 79.6* 80.5 79.6* 80.2 81.1  PLT 246 291 250 270 161   Basic Metabolic Panel: Recent Labs  Lab 08/12/20 0538 08/13/20 0251 08/14/20 0625 08/15/20 0500 08/15/20 1819 08/17/20 0402  NA 138 137 139 141 140 142  K 3.8 3.5 3.9 3.8 3.5 3.4*  CL 103 103 103 104 104 105  CO2 22 22 25 25 25 27   GLUCOSE 206* 129* 84 102* 112* 156*  BUN 62* 64* 61* 59* 52* 39*  CREATININE 2.67* 2.94* 2.64* 2.29* 2.06* 1.44*  CALCIUM 8.4* 8.2* 8.6* 8.6* 8.6* 8.9  MG 2.1  --  2.3  --   --   --   PHOS 4.9*  4.2 3.6  --  2.7  --    Liver Function Tests: Recent Labs  Lab 08/11/20 1847 08/13/20 0251 08/15/20 1819  AST 15 14*  --   ALT 12 11  --   ALKPHOS 46 40  --   BILITOT 0.5 0.5  --   PROT 5.4* 5.3*  --   ALBUMIN 2.0* 2.0* 2.2*   Coagulation Profile: Recent Labs  Lab 08/13/20 0251  INR 1.2   HbA1C: No results for input(s): HGBA1C in the last 72 hours. CBG: Recent Labs  Lab 08/16/20 0804  GLUCAP 129*    Recent Results (from the past 240 hour(s))  Resp Panel by RT-PCR (Flu A&B, Covid) Nasopharyngeal Swab     Status: Abnormal   Collection Time: 08/09/20  4:41 AM   Specimen: Nasopharyngeal Swab; Nasopharyngeal(NP) swabs in vial transport medium  Result Value Ref Range Status   SARS Coronavirus 2 by RT  PCR POSITIVE (A) NEGATIVE Final    Comment: RESULT CALLED TO, READ BACK BY AND VERIFIED WITH: GARRISON,G. RN AT 0254 08/09/20 MULLINS,T (NOTE) SARS-CoV-2 target nucleic acids are DETECTED.  The SARS-CoV-2 RNA is generally detectable in upper respiratory specimens during the acute phase of infection. Positive results are indicative of the presence of the identified virus, but do not rule out bacterial infection or co-infection with other pathogens not detected by the test. Clinical correlation with patient history and other diagnostic information is necessary to determine patient infection status. The expected result is Negative.  Fact Sheet for Patients: EntrepreneurPulse.com.au  Fact Sheet for Healthcare Providers: IncredibleEmployment.be  This test is not yet approved or cleared by the [place] FDA and  has been authorized for detection and/or diagnosis of SARS-CoV-2 by FDA under an Emergency Use Authorization (EUA).  This EUA will remain in effect (meaning this tes t can be used) for the duration of  the COVID-19 declaration under Section 564(b)(1) of the Act, 21 U.S.C. section 360bbb-3(b)(1), unless the authorization  is terminated or revoked sooner.     Influenza A by PCR NEGATIVE NEGATIVE Final   Influenza B by PCR NEGATIVE NEGATIVE Final    Comment: (NOTE) The Xpert Xpress SARS-CoV-2/FLU/RSV plus assay is intended as an aid in the diagnosis of influenza from Nasopharyngeal swab specimens and should not be used as a sole basis for treatment. Nasal washings and aspirates are unacceptable for Xpert Xpress SARS-CoV-2/FLU/RSV testing.  Fact Sheet for Patients: EntrepreneurPulse.com.au  Fact Sheet for Healthcare Providers: IncredibleEmployment.be  This test is not yet approved or cleared by the [place] FDA and has been authorized for detection and/or diagnosis of SARS-CoV-2 by FDA under an Emergency Use Authorization (EUA). This EUA will remain in effect (meaning this test can be used) for the duration of the COVID-19 declaration under Section 564(b)(1) of the Act, 21 U.S.C. section 360bbb-3(b)(1), unless the authorization is terminated or revoked.  Performed at [facility], [place] [address], [place], [place] [address]   Aerobic/Anaerobic Culture (surgical/deep wound)     Status: None   Collection Time: 08/09/20  3:51 PM   Specimen: Abscess  Result Value Ref Range Status   Specimen Description   Final    ABSCESS Performed at [place] [address], [place], [place] [address]    Special Requests   Final    NONE Performed at [facility], [place] [address], [place], [place] [address]    Gram Stain   Final    NO WBC SEEN ABUNDANT GRAM POSITIVE COCCI ABUNDANT GRAM NEGATIVE RODS ABUNDANT GRAM POSITIVE RODS ABUNDANT GRAM VARIABLE ROD Performed at [facility], [place] [address], [place], [place] [address]    Culture   Final    MODERATE ACTINOMYCES ODONTOLYTICUS RARE ESCHERICHIA COLI MIXED ANAEROBIC FLORA PRESENT.  CALL LAB IF FURTHER IID REQUIRED.    Report Status 08/15/2020 FINAL   Final   Organism ID, Bacteria ESCHERICHIA COLI  Final      Susceptibility   Escherichia coli - MIC*    AMPICILLIN 4 SENSITIVE Sensitive     CEFAZOLIN <=4 SENSITIVE Sensitive     CEFEPIME <=0.12 SENSITIVE Sensitive     CEFTAZIDIME <=1 SENSITIVE Sensitive     CEFTRIAXONE <=0.25 SENSITIVE Sensitive     CIPROFLOXACIN <=0.25 SENSITIVE Sensitive     GENTAMICIN <=1 SENSITIVE Sensitive     IMIPENEM <=0.25 SENSITIVE Sensitive     TRIMETH/SULFA <=20 SENSITIVE Sensitive  AMPICILLIN/SULBACTAM <=2 SENSITIVE Sensitive     PIP/TAZO <=4 SENSITIVE Sensitive     * RARE ESCHERICHIA COLI  MRSA PCR Screening     Status: None   Collection Time: 08/11/20  4:33 PM   Specimen: Nasal Mucosa; Nasopharyngeal  Result Value Ref Range Status   MRSA by PCR NEGATIVE NEGATIVE Final    Comment:        The GeneXpert MRSA Assay (FDA approved for NASAL specimens only), is one component of a comprehensive MRSA colonization surveillance program. It is not intended to diagnose MRSA infection nor to guide or monitor treatment for MRSA infections. Performed at [facility], [place] [address], [place], [place] [address]      Radiology Studies: VAS [place] LOWER EXTREMITY VENOUS (DVT)  Result Date: 08/17/2020  Lower Venous DVT Study Indications: Edema.  Risk Factors: COVID 19 positive. Limitations: Body habitus, poor ultrasound/tissue interface and patient positioning, patient pain tolerance. Comparison Study: No prior studies. Performing Technologist: Oliver Hum RVT  Examination Guidelines: A complete evaluation includes B-mode imaging, spectral Doppler, color Doppler, and power Doppler as needed of all accessible portions of each vessel. Bilateral testing is considered an integral part of a complete examination. Limited examinations for reoccurring indications may be performed as noted. The reflux portion of the exam is performed with the patient in reverse Trendelenburg.   +---------+---------------+---------+-----------+----------+--------------+ RIGHT    CompressibilityPhasicitySpontaneityPropertiesThrombus Aging +---------+---------------+---------+-----------+----------+--------------+ CFV      Full           Yes      Yes                                 +---------+---------------+---------+-----------+----------+--------------+ SFJ      Full                                                        +---------+---------------+---------+-----------+----------+--------------+ FV Prox  Full                                                        +---------+---------------+---------+-----------+----------+--------------+ FV Mid   Full                                                        +---------+---------------+---------+-----------+----------+--------------+ FV DistalFull                                                        +---------+---------------+---------+-----------+----------+--------------+ PFV      Full                                                        +---------+---------------+---------+-----------+----------+--------------+  POP      Full           Yes      Yes                                 +---------+---------------+---------+-----------+----------+--------------+ PTV      Full                                                        +---------+---------------+---------+-----------+----------+--------------+ PERO     Full                                                        +---------+---------------+---------+-----------+----------+--------------+   +---------+---------------+---------+-----------+----------+-------------------+ LEFT     CompressibilityPhasicitySpontaneityPropertiesThrombus Aging      +---------+---------------+---------+-----------+----------+-------------------+ CFV      Full           Yes      Yes                                       +---------+---------------+---------+-----------+----------+-------------------+ SFJ      Full                                                             +---------+---------------+---------+-----------+----------+-------------------+ FV Prox  Full                                                             +---------+---------------+---------+-----------+----------+-------------------+ FV Mid   Full                                                             +---------+---------------+---------+-----------+----------+-------------------+ FV Distal               Yes      Yes                                      +---------+---------------+---------+-----------+----------+-------------------+ PFV      Full                                                             +---------+---------------+---------+-----------+----------+-------------------+ POP      Full  Yes      Yes                                      +---------+---------------+---------+-----------+----------+-------------------+ PTV      Full                                                             +---------+---------------+---------+-----------+----------+-------------------+ PERO                                                  Not well visualized +---------+---------------+---------+-----------+----------+-------------------+     Summary: RIGHT: - There is no evidence of deep vein thrombosis in the lower extremity. However, portions of this examination were limited- see technologist comments above.  - No cystic structure found in the popliteal fossa.  LEFT: - There is no evidence of deep vein thrombosis in the lower extremity. However, portions of this examination were limited- see technologist comments above.  - No cystic structure found in the popliteal fossa.  *See table(s) above for measurements and observations.    Preliminary     Marzetta Board, MD, PhD [facility]  Between 7 am - 7 pm I am available, please contact me via Amion or Securechat  Between 7 pm - 7 am I am not available, please contact night coverage MD/APP via Amion

## 2020-08-17 NOTE — TOC Initial Note (Signed)
Transition of Care [facility]) - Initial/Assessment Note    Patient Details  Name: [name] MRN: [id number] Date of Birth: April 16, 1941  Transition of Care [facility]) CM/SW Contact:    Trish Mage, LCSW Phone Number: 08/17/2020, 11:44 AM  Clinical Narrative:  Patient seen in follow up to PT recommendation of SNF.  [name] states she lives at home with husband and is also caregiver for her daughter who has dementia, whom she is worried about.  Her granddaughter is currently caring for patient's daughter, and she asked me to give her a call.   While [name] would prefer to not go to rehab, she is realistic and stated "what choice do I have?  I can't do for myself right now."  I explained bed search process to her.  Bed search initiated.   Spoke to granddaughter [name] who is both supportive of patient going to rehab, while also having questions about qualifying her aunt for MCD so she has a payor source for LTC.  Referred her to [facility]. TOC will continue to follow during the course of hospitalization.                  Expected Discharge Plan: Skilled Nursing Facility Barriers to Discharge: SNF Covid   Patient Goals and CMS Choice     Choice offered to / list presented to : Patient  Expected Discharge Plan and Services Expected Discharge Plan: [place]   Discharge Planning Services: CM Consult Post Acute Care Choice: [place] Living arrangements for the past 2 months: Single Family Home                                      Prior Living Arrangements/Services Living arrangements for the past 2 months: Single Family Home Lives with:: Spouse,Adult Children Patient language and need for interpreter reviewed:: Yes        Need for Family Participation in Patient Care: Yes (Comment) Care giver support system in place?: Yes (comment) Current home services: DME Criminal Activity/Legal Involvement Pertinent to Current  Situation/Hospitalization: No - Comment as needed  Activities of Daily Living Home Assistive Devices/Equipment: Cane (specify quad or straight),Oxygen (single point cane) ADL Screening (condition at time of admission) Patient's cognitive ability adequate to safely complete daily activities?: No (patient very lethargic) Is the patient deaf or have difficulty hearing?: No Does the patient have difficulty seeing, even when wearing glasses/contacts?: No Does the patient have difficulty concentrating, remembering, or making decisions?: Yes Patient able to express need for assistance with ADLs?: Yes Does the patient have difficulty dressing or bathing?: Yes Independently performs ADLs?: No Communication: Independent Dressing (OT): Needs assistance Is this a change from baseline?: Change from baseline, expected to last >3 days Grooming: Needs assistance Is this a change from baseline?: Change from baseline, expected to last >3 days Feeding: Needs assistance Is this a change from baseline?: Change from baseline, expected to last >3 days Bathing: Needs assistance Is this a change from baseline?: Change from baseline, expected to last >3 days Toileting: Needs assistance Is this a change from baseline?: Change from baseline, expected to last >3days In/Out Bed: Needs assistance Is this a change from baseline?: Change from baseline, expected to last >3 days Walks in Home: Needs assistance Is this a change from baseline?: Change from baseline, expected to last >3 days Does the patient have difficulty walking or climbing  stairs?: Yes (secondary to weakness) Weakness of Legs: Both Weakness of Arms/Hands: None  Permission Sought/Granted Permission sought to share information with : Family Supports Permission granted to share information with : Yes, Verbal Permission Granted  Share Information with NAME: [name], granddaughter,[phone]           Emotional Assessment Appearance:: Appears  stated age Attitude/Demeanor/Rapport: Engaged Affect (typically observed): Appropriate Orientation: : Oriented to Self,Oriented to Place,Oriented to  Time,Oriented to Situation Alcohol / Substance Use: Not Applicable Psych Involvement: No (comment)  Admission diagnosis:  Irreducible ventral hernia [K43.6] Abscess of abdominal cavity (HCC) [F79.0] Umbilical hernia, incarcerated [K42.0] Ruptured appendicitis [K35.32] COVID-19 [U07.1] Patient Active Problem List   Diagnosis Date Noted  . Shock circulatory ([place])   . Irreducible ventral hernia 08/09/2020  . Infection as cause of abscess of colon 08/09/2020  . COPD (chronic obstructive pulmonary disease) ([place]) 08/09/2020  . Left lower quadrant abdominal tenderness without rebound tenderness 02/06/2020  . Chronic respiratory failure with hypoxia ([place]) 02/06/2020  . Routine general medical examination at a health care facility 02/06/2020  . Umbilical hernia with obstruction 08/27/2018  . AVM (arteriovenous malformation) of small bowel, acquired with hemorrhage 08/14/2018  . Hernia, ventral 08/12/2018  . Multiple lung nodules on CT 05/05/2018  . Stenosis of cervical spine with myelopathy ([place]) 01/01/2018  . Atrial fibrillation ([place]) 10/17/2017  . CHF (congestive heart failure), NYHA class III, chronic, diastolic ([place]) 24/03/7352  . Symptomatic anemia 08/10/2017  . Angiodysplasia of cecum   . Angiodysplasia of duodenum   . Grade III diastolic dysfunction 29/92/4268  . MGUS (monoclonal gammopathy of unknown significance) 11/08/2016  . Asteatotic eczema 02/25/2016  . COLD (chronic obstructive lung disease) ([place])   . Anemia, iron deficiency 11/16/2014  . Cor pulmonale ([place]) 11/16/2014  . OSA (obstructive sleep apnea) 06/22/2013  . Chronic venous stasis dermatitis of both lower extremities 06/02/2013  . Vitamin D deficiency 02/06/2013  . Osteopenia, senile 02/05/2013  . Other screening mammogram 02/05/2013  . Chronic kidney disease, stage  3b ([place]) 06/20/2010  . Morbid obesity ([place]) 04/19/2010  . Gout 01/10/2009  . ULCERATIVE COLITIS-LEFT SIDE 03/19/2008  . Vitamin B12 deficiency anemia 11/13/2006  . Hyperlipidemia with target LDL less than 100 11/27/1996  . GERD 07/30/1992  . HTN (hypertension) 07/30/1988   PCP:  [name], MD Pharmacy:   [facility], [place] - [place] AT [place] [address] Phone: [phone] Fax: [place], [place] [address] [address] Phone: [phone] Fax: [phone]     Social Determinants of Health (SDOH) Interventions    Readmission Risk Interventions No flowsheet data found.
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
## 2020-08-18 DIAGNOSIS — N1832 Chronic kidney disease, stage 3b: Secondary | ICD-10-CM | POA: Diagnosis not present

## 2020-08-18 DIAGNOSIS — K436 Other and unspecified ventral hernia with obstruction, without gangrene: Secondary | ICD-10-CM | POA: Diagnosis not present

## 2020-08-18 DIAGNOSIS — K651 Peritoneal abscess: Secondary | ICD-10-CM | POA: Diagnosis not present

## 2020-08-18 DIAGNOSIS — I5032 Chronic diastolic (congestive) heart failure: Secondary | ICD-10-CM | POA: Diagnosis not present

## 2020-08-18 LAB — BASIC METABOLIC PANEL
Anion gap: 11 (ref 5–15)
BUN: 31 mg/dL — ABNORMAL HIGH (ref 8–23)
CO2: 26 mmol/L (ref 22–32)
Calcium: 8.6 mg/dL — ABNORMAL LOW (ref 8.9–10.3)
Chloride: 103 mmol/L (ref 98–111)
Creatinine, Ser: 1.35 mg/dL — ABNORMAL HIGH (ref 0.44–1.00)
GFR, Estimated: 40 mL/min — ABNORMAL LOW (ref >60)
Glucose, Bld: 123 mg/dL — ABNORMAL HIGH (ref 70–99)
Potassium: 3.5 mmol/L (ref 3.5–5.1)
Sodium: 140 mmol/L (ref 135–145)

## 2020-08-18 LAB — CBC
HCT: 25.1 % — ABNORMAL LOW (ref 36.0–46.0)
Hemoglobin: 7.8 g/dL — ABNORMAL LOW (ref 12.0–15.0)
MCH: 25.3 pg — ABNORMAL LOW (ref 26.0–34.0)
MCHC: 31.1 g/dL (ref 30.0–36.0)
MCV: 81.5 fL (ref 80.0–100.0)
Platelets: 255 10*3/uL (ref 150–400)
RBC: 3.08 MIL/uL — ABNORMAL LOW (ref 3.87–5.11)
RDW: 15.7 % — ABNORMAL HIGH (ref 11.5–15.5)
WBC: 13.4 10*3/uL — ABNORMAL HIGH (ref 4.0–10.5)
nRBC: 0.3 % — ABNORMAL HIGH (ref 0.0–0.2)

## 2020-08-18 MED ORDER — AMOXICILLIN-POT CLAVULANATE 875-125 MG PO TABS
1.0000 | ORAL_TABLET | Freq: Two times a day (BID) | ORAL | Status: DC
  Administered 2020-08-18 – 2020-08-19 (×2): 1 via ORAL
  Filled 2020-08-18 (×3): qty 1

## 2020-08-18 MED ORDER — SODIUM CHLORIDE 0.9 % IV SOLN
3.0000 g | Freq: Four times a day (QID) | INTRAVENOUS | Status: DC
  Administered 2020-08-18: 3 g via INTRAVENOUS
  Filled 2020-08-18: qty 3
  Filled 2020-08-18: qty 8

## 2020-08-18 MED ORDER — SACCHAROMYCES BOULARDII 250 MG PO CAPS
250.0000 mg | ORAL_CAPSULE | Freq: Two times a day (BID) | ORAL | Status: DC
  Administered 2020-08-18 – 2020-08-19 (×3): 250 mg via ORAL
  Filled 2020-08-18 (×3): qty 1

## 2020-08-18 NOTE — TOC Progression Note (Signed)
Transition of Care [facility]) - Progression Note    Patient Details  Name: [name] MRN: [id number] Date of Birth: [birth year]  Transition of Care [facility]) CM/SW [place], [place] Phone Number: 08/18/2020, 3:49 PM  Clinical Narrative:   Lexicographer #: [phone]    Expected Discharge Plan: Skilled Nursing Facility Barriers to Discharge: SNF Covid  Expected Discharge Plan and Services Expected Discharge Plan: [place]   Discharge Planning Services: CM Consult Post Acute Care Choice: [place] Living arrangements for the past 2 months: Single Family Home                                       Social Determinants of Health (SDOH) Interventions    Readmission Risk Interventions No flowsheet data found.

## 2020-08-18 NOTE — Discharge Instructions (Signed)
[facility], [place] [phone]  OPEN ABDOMINAL SURGERY: POST OP INSTRUCTIONS Be at the office 30 minutes early for check in.  Bring family or staff member with you for your visit.  We do not have staff who can care for you in the waiting room.    Always review your discharge instruction sheet given to you by the facility where your surgery was performed.  IF YOU HAVE DISABILITY OR FAMILY LEAVE FORMS, YOU MUST BRING THEM TO THE OFFICE FOR PROCESSING.  PLEASE DO NOT GIVE THEM TO YOUR DOCTOR.  1. A prescription for pain medication may be given to you upon discharge.  Take your pain medication as prescribed, if needed.  If narcotic pain medicine is not needed, then you may take acetaminophen (Tylenol) or ibuprofen (Advil) as needed. 2. Take your usually prescribed medications unless otherwise directed. 3. If you need a refill on your pain medication, please contact your pharmacy. They will contact our office to request authorization.  Prescriptions will not be filled after 5pm or on week-ends. 4. You should follow a light diet the first few days after arrival home, such as soup and crackers, pudding, etc.unless your doctor has advised otherwise. A high-fiber, low fat diet can be resumed as tolerated.   Be sure to include lots of fluids daily. Most patients will experience some swelling and bruising on the chest and neck area.  Ice packs will help.  Swelling and bruising can take several days to resolve 5. Most patients will experience some swelling and bruising in the area of the incision. Ice pack will help. Swelling and bruising can take several days to resolve..  6. It is common to experience some constipation if taking pain medication after surgery.  Increasing fluid intake and taking a stool softener will usually help or prevent this problem from occurring.  A mild laxative (Milk of Magnesia or Miralax) should be taken according to package directions if there are no bowel  movements after 48 hours. 7.  You may have steri-strips (small skin tapes) in place directly over the incision.  These strips should be left on the skin for 7-10 days.  If your surgeon used skin glue on the incision, you may shower in 24 hours.  The glue will flake off over the next 2-3 weeks.  Any sutures or staples will be removed at the office during your follow-up visit. You may find that a light gauze bandage over your incision may keep your staples from being rubbed or pulled. You may shower and replace the bandage daily. 8. ACTIVITIES:  You may resume regular (light) daily activities beginning the next day--such as daily self-care, walking, climbing stairs--gradually increasing activities as tolerated.  You may have sexual intercourse when it is comfortable.  Refrain from any heavy lifting or straining until approved by your doctor. a. You may drive when you no longer are taking prescription pain medication, you can comfortably wear a seatbelt, and you can safely maneuver your car and apply brakes b. Return to Work: ___________________________________ 33. You should see your doctor in the office for a follow-up appointment approximately two weeks after your surgery.  Make sure that you call for this appointment within a day or two after you arrive home to insure a convenient appointment time. OTHER INSTRUCTIONS:  _____________________________________________________________ _____________________________________________________________  WHEN TO CALL YOUR DOCTOR: 1. Fever over 101.0 2. Inability to urinate 3. Nausea and/or vomiting 4. Extreme swelling or bruising 5. Continued bleeding from  incision. 6. Increased pain, redness, or drainage from the incision. 7. Difficulty swallowing or breathing 8. Muscle cramping or spasms. 9. Numbness or tingling in hands or feet or around lips.  The clinic staff is available to answer your questions during regular business hours.  Please don't hesitate to  call and ask to speak to one of the nurses if you have concerns.  For further questions, please visit [email or web address]  Mechanical Wound Debridement Wet to dry dressing to open abdominal wound twice a day.  Remove old packing, clean area with soap and water then redress wet to dry within the open wound. Use sterile saline as wetting agent.   Keep the surface skin clean and dry.  Minimize tape to skin Mechanical wound debridement is a treatment to remove dead tissue from a wound. This helps the wound heal. The treatment involves cleaning the wound by using mechanical force. This may include:  Wound irrigation. This method involves rinsing the wound out with fluid.  Wet-to-dry dressing. This method involves putting wet dressings into the wound, allowing them to dry, then removing the dry dressings to remove debris and tissue from the wound.  Using a pad or gauze to remove dead tissue and debris from the wound.  Pulsatile lavage. This method involves cleaning the wound with a pressurized stream of fluid. Depending on the wound, you may need to repeat this procedure or change to another form of debridement as your wound starts to heal. Tell a health care provider about:  Any allergies you have.  All medicines you are taking, including vitamins, herbs, eye drops, creams, and over-the-counter medicines.  Any blood disorders you have.  Any medical conditions you have, or have had, especially conditions that cause a decrease in blood flow to the wound area, such as peripheral vascular disease, or conditions that affect your body's defense (immune) system or white blood count.  Any surgeries you have had.  Whether you are pregnant or may be pregnant. What are the risks? Generally, this is a safe procedure. However, problems may occur, including:  Infection.  Bleeding.  Damage to healthy tissue in and around your wound.  Soreness or pain.  Failure of the wound to  heal.  Scarring. What happens during the procedure?  Your health care provider may apply a numbing medicine (topical anesthetic) to the wound.  Your health care provider may irrigate your wound with a germ-free salt solution (saline). This helps to loosen or remove debris, bacteria, and dead tissue.  Depending on the type of mechanical wound debridement you are having, your health care provider may do one of the following: ? Put a dressing on your wound. You may have a dry gauze pad placed into the wound. Your health care provider will remove the gauze after the wound is dry. Any dead tissue and debris that has dried into the gauze will be lifted out of the wound (wet-to-dry debridement). ? Use a type of pad (monofilament fiber debridement pad) that has a fluffy surface on one side that picks up dead tissue and debris from your wound. Your health care provider wets the pad and wipes it over your wound for several minutes. ? Flush (irrigate) your wound with a pressurized stream of solution such as saline or water (pulsatile lavage).  Once your health care provider is finished, he or she may apply a light dressing to your wound. The procedure may vary among health care providers and hospitals.   What happens after the procedure?  You may get medicine for pain. Summary  Mechanical wound debridement is a treatment to remove dead tissue from a wound. This helps the wound heal.  Depending on the wound, you may need to repeat this procedure or change to another form of debridement as your wound starts to heal.  This treatment may include: wound irrigation, wet-to-dry dressing, using a pad or gauze to remove dead tissue and debris from the wound, or pulsatile lavage. This information is not intended to replace advice given to you by your health care provider. Make sure you discuss any questions you have with your health care provider. Document Revised: 06/23/2018 Document Reviewed:  06/23/2018 Elsevier Patient Education  2021 [employer], Adult  Colostomy surgery is done to create an opening in the front of the abdomen for stool (feces) to leave the body through an ostomy (stoma). Part of the large intestine is attached to the stoma. A bag, also called a pouch, is fitted over the stoma. Stool and gas will collect in the bag. After surgery, you will need to empty and change your colostomy bag as needed. You will also need to care for your stoma. How to care for the stoma Your stoma should look pink, red, and moist, like the inside of your cheek. Soon after surgery, the stoma may be swollen, but this swelling will go away within 6 weeks. To care for the stoma:  Keep the skin around the stoma clean and dry.  Use a clean, soft washcloth to gently wash the stoma and the skin around it. Clean using a circular motion, and wipe away from the stoma opening, not toward it. ? Use warm water and only use cleansers recommended by your health care provider. ? Rinse the stoma area with plain water. ? Dry the area around the stoma well.  Use stoma powder or ointment on your skin only as told by your health care provider. Do not use any other powders, gels, wipes, or creams on the skin around the stoma.  Check the stoma area every day for signs of infection. Check for: ? New or worsening redness, swelling, or pain. ? New or increased fluid or blood. ? Pus or warmth.  Measure the stoma opening regularly and record the size. Watch for changes. (It is normal for the stoma to get smaller as swelling goes away.) Share this information with your health care provider. How to empty the colostomy bag Empty your bag at bedtime and whenever it is one-third to one-half full. Do not let the bag get more than half-full with stool or gas. The bag could leak if it gets too full. Some colostomy bags have a built-in gas release valve that releases gas often throughout the  day. Follow these basic steps: 1. Wash your hands with soap and water. 2. Sit far back on the toilet seat. 3. Put several pieces of toilet paper into the toilet water. This will prevent splashing as you empty stool into the toilet. 4. Remove the clip or the hook-and-loop fastener from the tail end of the bag. 5. Unroll the tail, then empty the stool into the toilet. 6. Clean the tail with toilet paper or a moist towelette. 7. Reroll the tail, and close it with the clip or the hook-and-loop fastener. 8. Wash your hands again.   How to change the colostomy bag Change your bag every 3-4 days or as often as told by your health care provider. Also change the bag  if it is leaking or separating from the skin, or if your skin around the stoma looks or feels irritated. Irritated skin may be a sign that the bag is leaking. Always have colostomy supplies with you, and follow these basic steps: 1. Wash your hands with soap and water. Have paper towels or tissues nearby to clean any discharge. 2. Remove the old bag and skin barrier. Use your fingers or a warm cloth to gently push the skin away from the barrier. 3. Clean the stoma area with water or with mild soap and water, as directed. Use water to rinse away any soap. 4. Dry the skin. You may use the cool setting on a hair dryer to do this. 5. Use a tracing pattern (template) to cut the skin barrier to the size needed. 6. If you are using a two-piece bag, attach the bag and the skin barrier to each other. Add the barrier ring, if you use one. 7. If directed, apply stoma powder or skin barrier gel to the skin. 8. Warm the skin barrier with your hands, or blow with a hair dryer for 5-10 seconds. 9. Remove the paper from the adhesive strip of the skin barrier. 10. Press the adhesive strip onto the skin around the stoma. 11. Gently rub the skin barrier onto the skin. This creates heat that helps the barrier to stick. 12. Apply stoma tape to the edges of the  skin barrier, if desired. 17. Wash your hands again. General recommendations  Avoid wearing tight clothes or having anything press directly on your stoma or bag. Change your clothing whenever it is soiled or damp.  You may shower or bathe with the bag on or off. Do not use harsh or oily soaps or lotions. Dry the skin and bag after bathing.  Store all supplies in a cool, dry place. Do not leave supplies in extreme heat because some parts can melt or not stick as well.  Whenever you leave home, take extra clothing and an extra skin barrier and bag with you.  If your bag gets wet, you can dry it with a hair dryer on the cool setting.  To prevent odor, you may put drops of ostomy deodorizer in the bag.  If recommended by your health care provider, put ostomy lubricant inside the bag. This helps stool to slide out of the bag more easily and completely. Contact a health care provider if:  You have new or worsening redness, swelling, or pain around your stoma.  You have new or increased fluid or blood coming from your stoma.  Your stoma feels warm to the touch.  You have pus coming from your stoma.  Your stoma extends in or out farther than normal.  You need to change your bag every day.  You have a fever. Get help right away if:  Your stool is bloody.  You have nausea or you vomit.  You have trouble breathing. Summary  Measure your stoma opening regularly and record the size. Watch for changes.  Empty your bag at bedtime and whenever it is one-third to one-half full. Do not let the bag get more than half-full with stool or gas.  Change your bag every 3-4 days or as often as told by your health care provider.  Whenever you leave home, take extra clothing and an extra skin barrier and bag with you. This information is not intended to replace advice given to you by your health care provider. Make sure you discuss any  questions you have with your health care provider. Document  Revised: 03/17/2020 Document Reviewed: 03/17/2020 Elsevier Patient Education  2021 [employer].

## 2020-08-18 NOTE — Progress Notes (Signed)
Occupational Therapy Treatment Patient Details Name: [name] MRN: [id number] DOB: 1940/08/21 Today's Date: 08/18/2020    History of present illness 80 y.o. female with a history of 2L O2-dependent COPD, stage IIIbCKD, chronic HFpEF, morbid obesity who presented to the ED with 3 weeks of worsening abdominal pain and enlargement of chronic ventral hernia found to have hernia incarceration with evidence of perforation (diverticular vs. appendix) as well as covid-19 pneumonia. IR performed aspiration and drain placement 1/11 which returned feculent material.  Patient was seen by general surgery and underwent operative management on 1/14.  Postoperatively, she developed hypotension requiring vasopressors and was transferred to the ICU.   OT comments  Patient wanting to get OOB to chair. Note patient linens are wet and transfer to [facility] first with min A x2 for stand pivot and cues for body mechanics. Patient require total A in standing for peri care due to needing B UE support and limited standing tolerance of ~30 seconds. After seated rest patient min x2 to pivot from [facility] to recliner needing cues for sequencing + body mechanics. Patient with improving mobility however continue to recommend rehab as pt high fall risk with limited strength and endurance. Will continue to follow acutely.   Follow Up Recommendations  SNF    Equipment Recommendations  3 in 1 bedside commode       Precautions / Restrictions Precautions Precautions: Fall Precaution Comments: pt denies falls in past 1 year; abdominal surgery       Mobility Bed Mobility Overal bed mobility: Needs Assistance Bed Mobility: Supine to Sit     Supine to sit: Min assist;HOB elevated     General bed mobility comments: with increased time patient is able to bring LEs to EOB with cue use UE on bed rail to roll onto side and initiate sitting up. pt require min A to sit trunk upright.  Transfers Overall transfer level: Needs  assistance Equipment used: Rolling walker (2 wheeled);None Transfers: Sit to/from [employer] to Stand: Min assist;+2 safety/equipment Stand pivot transfers: Min assist;+2 physical assistance;+2 safety/equipment       General transfer comment: utilized walker for sit to stand from [facility] for perianal care as patient requires B UE support for prolonged standing, min x2 stand pivot to [facility] then walker with cues for hand placement and to guide hips to surfaces. x1 episode of LE buckling during transfer from [facility] to recliner    Balance Overall balance assessment: Needs assistance Sitting-balance support: Feet supported Sitting balance-Leahy Scale: Fair     Standing balance support: Bilateral upper extremity supported Standing balance-Leahy Scale: Poor Standing balance comment: relies on BUE support 2* buckling of knees                           ADL either performed or assessed with clinical judgement   ADL Overall ADL's : Needs assistance/impaired                 Upper Body Dressing : Minimal assistance;Sitting Upper Body Dressing Details (indicate cue type and reason): to change into clean gown     Toilet Transfer: Minimal assistance;+2 for physical assistance;+2 for safety/equipment;Stand-pivot;Cueing for safety;Cueing for sequencing;BSC Toilet Transfer Details (indicate cue type and reason): patient able to power up with min A x1, needs x2 for safety due to episode of LE buckling and limited standing tolerance. assisted guiding hips to [facility] to make sure safely aligned before patient sit Toileting- Clothing Manipulation  and Hygiene: Total assistance;Sit to/from stand Toileting - Clothing Manipulation Details (indicate cue type and reason): poor standing tolerance, reliant on B UE support of walker     Functional mobility during ADLs: Minimal assistance;+2 for physical assistance;+2 for safety/equipment;Cueing for safety;Rolling walker                  Cognition Arousal/Alertness: Awake/alert Behavior During Therapy: Impulsive Overall Cognitive Status: Impaired/Different from baseline                                 General Comments: patient requires multiple cues not to stand during session until therapist ready              General Comments VSS on room air    Pertinent Vitals/ Pain       Pain Assessment: Faces Faces Pain Scale: Hurts a little bit Pain Location: abdomen Pain Descriptors / Indicators: Sore;Grimacing Pain Intervention(s): Monitored during session         Frequency  Min 2X/week        Progress Toward Goals  OT Goals(current goals can now be found in the care plan section)  Progress towards OT goals: Progressing toward goals  Acute Rehab OT Goals Patient Stated Goal: to get stronger OT Goal Formulation: With patient Time For Goal Achievement: 08/31/20 Potential to Achieve Goals: Good ADL Goals Pt Will Perform Upper Body Bathing: with set-up;with supervision;standing (EOB) Pt Will Transfer to Toilet: stand pivot transfer;bedside commode;with min guard assist (walker) Pt/caregiver will Perform Home Exercise Program: Increased ROM;Increased strength;Both right and left upper extremity;Independently;With written HEP provided  Plan Discharge plan remains appropriate       AM-PAC OT "6 Clicks" Daily Activity     Outcome Measure   Help from another person eating meals?: A Little Help from another person taking care of personal grooming?: A Little Help from another person toileting, which includes using toliet, bedpan, or urinal?: A Lot Help from another person bathing (including washing, rinsing, drying)?: A Lot Help from another person to put on and taking off regular upper body clothing?: A Little Help from another person to put on and taking off regular lower body clothing?: Total 6 Click Score: 14    End of Session Equipment Utilized During Treatment: Rolling walker  OT  Visit Diagnosis: Unsteadiness on feet (R26.81);Other abnormalities of gait and mobility (R26.89);Muscle weakness (generalized) (M62.81);Pain Pain - part of body:  (abdomen)   Activity Tolerance Patient tolerated treatment well   Patient Left in chair;with call bell/phone within reach;with chair alarm set   Nurse Communication Mobility status        Time: 6333-5456 OT Time Calculation (min): 27 min  Charges: OT General Charges $OT Visit: 1 Visit OT Treatments $Self Care/Home Management : 23-37 mins  Delbert Phenix OT OT pager: [phone]   Rosemary Holms 08/18/2020, 12:22 PM

## 2020-08-18 NOTE — Progress Notes (Signed)
PHARMACY NOTE:  ANTIMICROBIAL RENAL DOSAGE ADJUSTMENT  Current antimicrobial regimen includes a mismatch between antimicrobial dosage and estimated renal function.  As per policy approved by the Pharmacy & Therapeutics and Medical Executive Committees, the antimicrobial dosage will be adjusted accordingly.  Current antimicrobial dosage:  unasyn 3gm IV q12h  Indication: intra-abdominal infection  Renal Function:  Estimated Creatinine Clearance: 32.4 mL/min (A) (by C-G formula based on SCr of 1.35 mg/dL (H)). []      On intermittent HD, scheduled: []      On CRRT    Antimicrobial dosage has been changed to:  unasyn 3gm IV q6h   Thank you for allowing pharmacy to be a part of this patient's care.  Lynelle Doctor, [facility] 08/18/2020 9:35 AM

## 2020-08-18 NOTE — Progress Notes (Signed)
7 Days Post-Op    CC: Abdominal pain  Subjective: Patient has stool and gas in her ostomy.  She is asking for help to get up and ambulate.  Open wound is packed wet-to-dry is fairly deep.  There is moderate amount of cellulitis around the base of the incision.  She appears to be tolerating a soft diet.  Objective: Vital signs in last 24 hours: Temp:  [98 F (36.7 C)-98.1 F (36.7 C)] 98 F (36.7 C) (01/20 0344) Pulse Rate:  [65-78] 78 (01/20 0344) Resp:  [16-20] 20 (01/20 0344) BP: (172-198)/(53-70) 172/53 (01/20 0344) SpO2:  [94 %-97 %] 95 % (01/20 0344) Last BM Date: 08/17/20 Nothing p.o. recorded 83.9 cc IV recorded 550 urine 125 stool Afebrile vital signs are stable. AKI improving 2.67>>1.35 WBC 13.4 H/H 7.8/25.1 No imaging   Intake/Output from previous day: 01/19 0701 - 01/20 0700 In: 183.9 [IV Piggyback:183.9] Out: 675 [Urine:550; Stool:125] Intake/Output this shift: No intake/output data recorded.  General appearance: alert, cooperative and She is very anxious and uncomfortable. Resp: Coughing and some wheezing.  On oxygen. GI: Soft very tender.  There are some erythema around the base of the open abdominal wound.  She has stool and gas in her colostomy.  Lab Results:  Recent Labs    08/17/20 0402 08/18/20 0419  WBC 12.0* 13.4*  HGB 8.0* 7.8*  HCT 25.8* 25.1*  PLT 248 255    BMET Recent Labs    08/17/20 0402 08/18/20 0419  NA 142 140  K 3.4* 3.5  CL 105 103  CO2 27 26  GLUCOSE 156* 123*  BUN 39* 31*  CREATININE 1.44* 1.35*  CALCIUM 8.9 8.6*   PT/INR No results for input(s): LABPROT, INR in the last 72 hours.  Recent Labs  Lab 08/11/20 1847 08/13/20 0251 08/15/20 1819  AST 15 14*  --   ALT 12 11  --   ALKPHOS 46 40  --   BILITOT 0.5 0.5  --   PROT 5.4* 5.3*  --   ALBUMIN 2.0* 2.0* 2.2*     Lipase     Component Value Date/Time   LIPASE 35 08/08/2020 1756     Medications: . allopurinol  100 mg Oral Daily  . amLODipine  10  mg Oral Daily  . Chlorhexidine Gluconate Cloth  6 each Topical Daily  . feeding supplement  237 mL Oral BID BM  . fluticasone furoate-vilanterol  1 puff Inhalation Daily  . heparin  5,000 Units Subcutaneous Q8H  . isosorbide-hydrALAZINE  2 tablet Oral BID  . mouth rinse  15 mL Mouth Rinse BID  . metoprolol tartrate  12.5 mg Oral BID  . pravastatin  40 mg Oral q1800  . sodium chloride flush  10-40 mL Intracatheter Q12H  . sodium chloride flush  5 mL Intracatheter Q8H  . torsemide  20 mg Oral Daily    Assessment/Plan H/O Ulcerative colitis  HTN A fib- not on anticoagulation Cor pulmonale Chronic combined CHF - last ECHO inOct 2021with EF 60-65% PVD COPD/O2 dependence HLD CKDstage III - Cr down from 2.06 > 1.44 MGUS OSA Obesity- BMI 35.74 Gout ABL anemia - hgb stable at 8>>7.8  COVID positive- per primary, patient has received 2 doses of vaccine but has not had booster yet.Completed remdesivir  3.5 cm R renal mass- concern for renal cell carcinoma. Urology, Dr. Tresa Moore, has seen and suspectslikely non-metastatic slowly growing renal cancer.Believes this is very unlikelyto become clinically significant at her level of comorbidity. Additionally, curative therapy would  have high probability of pushing her to [facility] she adamantly refuses dialysis.Herecommend watchful waiting with consideration of systemic palliative therapy should symptomatic / metastatic disease ever develop, whichhe believesis very unlikely.Hedid NOTrecommend any concomitant kidney surgeryat time of taking to OR.  Chronically incarcerated ventral herniawith mesenteric mass and transversae colon stricture  S/p Exploratory laparotomy, resection of mesenteric mass, partial colectomy, end colostomy - Dr. Ninfa Linden - 08/11/2020 - POD #7 - Adv to soft diet. Colostomy functioning. - WOCN following for new colostomy - Continue WTD dressing changes BID to abdominal wound - Mobilize. PT/OT.  Recommending SNF  - Monitor wbc. Stable at 12.0 this AM. Afebrile. AM labs.  - Cont IV abx. Will discuss with MD duration.  - Path back as noted below. No malignancy  - Pulm toilet. IS.   FEN -Soft, ensure VTE -SCDs, Heparin subq. Checking LE US's ID -Zosyn 1/11 - 1/17. Unasyn 1/17 >>Day 4 -WBC 39.4(1/14)>> 12.2>> 12.2>> 13.4 Foley - Out. [place] Follow-Up - Dr. Ninfa Linden   Path FINAL MICROSCOPIC DIAGNOSIS:  A. COLON, TRANSVERSE, WITH MESENTERIC MASS, RESECTION:  - Three colonic tubular adenomas.  - No high-grade dysplasia or carcinoma.  - Fibromembranous connective tissue with acutely inflamed granulation  tissue consistent incarcerated hernia  - No evidence of malignancy.    Plan: She is going to a skilled nursing facility as soon as she is cleared.  Tomorrow will be 10 days on her COVID quarantine.  Her WBC is not improving with ongoing cellulitis lower abdominal incision.  Will discuss with Dr. Brantley Stage duration of antibiotics, and possible discharge date.      LOS: 9 days    Geza Beranek 08/18/2020 Please see Amion

## 2020-08-18 NOTE — Progress Notes (Signed)
PROGRESS NOTE  [name] KZL:[id number] DOB: 1941/03/07 DOA: 08/09/2020 PCP: [name], MD   LOS: 9 days   Brief Narrative / Interim history: 80 year old female with COPD with chronic oxygen at home, 2 L, CKD 3B, chronic diastolic CHF, morbid obesity who came into the hospital with 3 weeks of worsening abdominal pain, enlargement of her chronic ventral hernia.  She was found to have hernia incarceration with evidence of perforation, and also COVID-19 pneumonia.  Eventually underwent operative repair by general surgery on 1/14.  She was briefly in the ICU postoperatively as required pressors.  She is back to [facility] service on 1/17  Subjective / 24h Interval events: Denies any shortness of breath, overall feeling stronger  Assessment & Plan: Principal Problem Incarcerated ventral hernia, mesenteric mass, suspected colonic/appendiceal perforation -Initially IR consulted and had to placement on 1/11, eventually had operative management on 1/14 by general surgery with mass resection, colectomy/colostomy -Discussed with surgery, no malignancy on pathology -Empirically on Unasyn, continue per surgery -There is some concern for cellulitis at the surgical site per general surgery  Active Problems COVID-19 pneumonia, breakthrough case -Subtle infiltrates on chest x-ray.  Completed a course of remdesivir.  Respiratory status currently appears stable and at baseline.  Chronic hypoxic respiratory failure, COPD -Chronically on a couple liters of oxygen at home, continue supplemental oxygen, on 2 L this morning -Continue bronchodilators.  Respiratory status stable  Chronic diastolic CHF -2D echo showed grade 2 diastolic dysfunction, preserved LVEF -Appears slightly fluid up with upper extremity swelling, on torsemide.  Anemia of critical illness -No bleeding, monitor, transfuse for hemoglobin less than 7  Acute kidney injury on CKD 3B -Baseline creatinine about 1.5, currently close to  baseline  Left renal mass -This will be addressed by urology as an outpatient  Hyperlipidemia -Continue statin  Essential hypertension -Continue isosorbide/hydralazine, amlodipine, torsemide.  Continue PRNs.  Blood pressure shortness of breath  Obesity -She would benefit from weight loss   Scheduled Meds: . allopurinol  100 mg Oral Daily  . amLODipine  10 mg Oral Daily  . Chlorhexidine Gluconate Cloth  6 each Topical Daily  . feeding supplement  237 mL Oral BID BM  . fluticasone furoate-vilanterol  1 puff Inhalation Daily  . heparin  5,000 Units Subcutaneous Q8H  . isosorbide-hydrALAZINE  2 tablet Oral BID  . mouth rinse  15 mL Mouth Rinse BID  . metoprolol tartrate  12.5 mg Oral BID  . pravastatin  40 mg Oral q1800  . sodium chloride flush  10-40 mL Intracatheter Q12H  . sodium chloride flush  5 mL Intracatheter Q8H  . torsemide  20 mg Oral Daily   Continuous Infusions: . sodium chloride    . ampicillin-sulbactam (UNASYN) IV 3 g (08/18/20 1028)   PRN Meds:.sodium chloride, albuterol, hydrALAZINE, HYDROmorphone (DILAUDID) injection, labetalol, ondansetron **OR** ondansetron (ZOFRAN) IV, sodium chloride flush  Diet Orders (From admission, onward)    Start     Ordered   08/17/20 0855  DIET SOFT Room service appropriate? Yes; Fluid consistency: Thin  Diet effective now       Question Answer Comment  Room service appropriate? Yes   Fluid consistency: Thin      08/17/20 0854          DVT prophylaxis: heparin injection 5,000 Units Start: 08/09/20 2200     Code Status: Full Code  Family Communication: no family present  Status is: Inpatient  Remains inpatient appropriate because:Inpatient level of care appropriate due to severity of  illness appears stable for discharge to SNF from medical standpoint however surgery has some concern about developing cellulitis at the surgical site   Dispo: The patient is from: Home              Anticipated d/c is to: Home               Anticipated d/c date is: 3 days              Patient currently is not medically stable to d/c.  Consultants:  General surgery   Procedures:   Anterior abdominal wall drain placement 41F pigtail drain 08/09/2020 Dr. Earleen Newport 1/14: EXPLORATORY LAPAROTOMY, RESECTION OF MESENTERIC MASS, PARTIAL COLECTOMY, END COLOSTOMY  Microbiology  Wound cultures-pansensitive E. coli  Antimicrobials: Unasyn     Objective: Vitals:   08/17/20 0210 08/17/20 1435 08/17/20 2114 08/18/20 0344  BP: (!) 161/59 (!) 198/70 (!) 186/60 (!) 172/53  Pulse: 69 65 74 78  Resp: 16 16 20 20   Temp: 98.2 F (36.8 C) 98.1 F (36.7 C) 98 F (36.7 C) 98 F (36.7 C)  TempSrc:  Oral    SpO2: 94% 94% 97% 95%  Weight:      Height:        Intake/Output Summary (Last 24 hours) at 08/18/2020 1153 Last data filed at 08/18/2020 0538 Gross per 24 hour  Intake 183.9 ml  Output 675 ml  Net -491.1 ml   Filed Weights   08/08/20 1413 08/09/20 2329  Weight: 83 kg 86.1 kg    Examination:  Constitutional: NAD Eyes: No icterus ENMT: Moist mucous membranes Neck: normal, supple Respiratory: Clear bilaterally, no wheezing, no crackles Cardiovascular: Regular rate and rhythm, no murmur was Abdomen: Soft, nondistended, bowel sounds positive ostomy bag in place.  Did not take down surgical dressing Musculoskeletal: no clubbing / cyanosis.  Skin: No rashes Neurologic: non focal   Data Reviewed: I have independently reviewed following labs and imaging studies   CBC: Recent Labs  Lab 08/14/20 0625 08/15/20 0500 08/16/20 0345 08/17/20 0402 08/18/20 0419  WBC 15.1* 11.8* 12.2* 12.0* 13.4*  HGB 8.8* 7.9* 7.8* 8.0* 7.8*  HCT 28.0* 24.9* 25.1* 25.8* 25.1*  MCV 80.5 79.6* 80.2 81.1 81.5  PLT 291 250 270 248 161   Basic Metabolic Panel: Recent Labs  Lab 08/12/20 0538 08/13/20 0251 08/14/20 0625 08/15/20 0500 08/15/20 1819 08/17/20 0402 08/18/20 0419  NA 138 137 139 141 140 142 140  K 3.8 3.5 3.9 3.8 3.5  3.4* 3.5  CL 103 103 103 104 104 105 103  CO2 22 22 25 25 25 27 26   GLUCOSE 206* 129* 84 102* 112* 156* 123*  BUN 62* 64* 61* 59* 52* 39* 31*  CREATININE 2.67* 2.94* 2.64* 2.29* 2.06* 1.44* 1.35*  CALCIUM 8.4* 8.2* 8.6* 8.6* 8.6* 8.9 8.6*  MG 2.1  --  2.3  --   --   --   --   PHOS 4.9* 4.2 3.6  --  2.7  --   --    Liver Function Tests: Recent Labs  Lab 08/11/20 1847 08/13/20 0251 08/15/20 1819  AST 15 14*  --   ALT 12 11  --   ALKPHOS 46 40  --   BILITOT 0.5 0.5  --   PROT 5.4* 5.3*  --   ALBUMIN 2.0* 2.0* 2.2*   Coagulation Profile: Recent Labs  Lab 08/13/20 0251  INR 1.2   HbA1C: No results for input(s): HGBA1C in the last 72 hours. CBG: Recent Labs  Lab 08/16/20 0804  GLUCAP 129*    Recent Results (from the past 240 hour(s))  Resp Panel by RT-PCR (Flu A&B, Covid) Nasopharyngeal Swab     Status: Abnormal   Collection Time: 08/09/20  4:41 AM   Specimen: Nasopharyngeal Swab; Nasopharyngeal(NP) swabs in vial transport medium  Result Value Ref Range Status   SARS Coronavirus 2 by RT PCR POSITIVE (A) NEGATIVE Final    Comment: RESULT CALLED TO, READ BACK BY AND VERIFIED WITH: GARRISON,G. RN AT 2263 08/09/20 MULLINS,T (NOTE) SARS-CoV-2 target nucleic acids are DETECTED.  The SARS-CoV-2 RNA is generally detectable in upper respiratory specimens during the acute phase of infection. Positive results are indicative of the presence of the identified virus, but do not rule out bacterial infection or co-infection with other pathogens not detected by the test. Clinical correlation with patient history and other diagnostic information is necessary to determine patient infection status. The expected result is Negative.  Fact Sheet for Patients: EntrepreneurPulse.com.au  Fact Sheet for Healthcare Providers: IncredibleEmployment.be  This test is not yet approved or cleared by the [place] FDA and  has been authorized for  detection and/or diagnosis of SARS-CoV-2 by FDA under an Emergency Use Authorization (EUA).  This EUA will remain in effect (meaning this tes t can be used) for the duration of  the COVID-19 declaration under Section 564(b)(1) of the Act, 21 U.S.C. section 360bbb-3(b)(1), unless the authorization is terminated or revoked sooner.     Influenza A by PCR NEGATIVE NEGATIVE Final   Influenza B by PCR NEGATIVE NEGATIVE Final    Comment: (NOTE) The Xpert Xpress SARS-CoV-2/FLU/RSV plus assay is intended as an aid in the diagnosis of influenza from Nasopharyngeal swab specimens and should not be used as a sole basis for treatment. Nasal washings and aspirates are unacceptable for Xpert Xpress SARS-CoV-2/FLU/RSV testing.  Fact Sheet for Patients: EntrepreneurPulse.com.au  Fact Sheet for Healthcare Providers: IncredibleEmployment.be  This test is not yet approved or cleared by the [place] FDA and has been authorized for detection and/or diagnosis of SARS-CoV-2 by FDA under an Emergency Use Authorization (EUA). This EUA will remain in effect (meaning this test can be used) for the duration of the COVID-19 declaration under Section 564(b)(1) of the Act, 21 U.S.C. section 360bbb-3(b)(1), unless the authorization is terminated or revoked.  Performed at [facility], [place] [address] [place], [place] [address]   Aerobic/Anaerobic Culture (surgical/deep wound)     Status: None   Collection Time: 08/09/20  3:51 PM   Specimen: Abscess  Result Value Ref Range Status   Specimen Description   Final    ABSCESS Performed at [place] [address], [place], [place] [address]    Special Requests   Final    NONE Performed at [facility], [place] [address] [place], [place] [address]    Gram Stain   Final    NO WBC SEEN ABUNDANT GRAM POSITIVE COCCI ABUNDANT GRAM NEGATIVE RODS ABUNDANT GRAM  POSITIVE RODS ABUNDANT GRAM VARIABLE ROD Performed at [facility], [place] [address], [place], [place] [address]    Culture   Final    MODERATE ACTINOMYCES ODONTOLYTICUS RARE ESCHERICHIA COLI MIXED ANAEROBIC FLORA PRESENT.  CALL LAB IF FURTHER IID REQUIRED.    Report Status 08/15/2020 FINAL  Final   Organism ID, Bacteria ESCHERICHIA COLI  Final      Susceptibility   Escherichia coli - MIC*    AMPICILLIN 4 SENSITIVE Sensitive  CEFAZOLIN <=4 SENSITIVE Sensitive     CEFEPIME <=0.12 SENSITIVE Sensitive     CEFTAZIDIME <=1 SENSITIVE Sensitive     CEFTRIAXONE <=0.25 SENSITIVE Sensitive     CIPROFLOXACIN <=0.25 SENSITIVE Sensitive     GENTAMICIN <=1 SENSITIVE Sensitive     IMIPENEM <=0.25 SENSITIVE Sensitive     TRIMETH/SULFA <=20 SENSITIVE Sensitive     AMPICILLIN/SULBACTAM <=2 SENSITIVE Sensitive     PIP/TAZO <=4 SENSITIVE Sensitive     * RARE ESCHERICHIA COLI  MRSA PCR Screening     Status: None   Collection Time: 08/11/20  4:33 PM   Specimen: Nasal Mucosa; Nasopharyngeal  Result Value Ref Range Status   MRSA by PCR NEGATIVE NEGATIVE Final    Comment:        The GeneXpert MRSA Assay (FDA approved for NASAL specimens only), is one component of a comprehensive MRSA colonization surveillance program. It is not intended to diagnose MRSA infection nor to guide or monitor treatment for MRSA infections. Performed at [facility], [place] [address] [place], [place] [address]      Radiology Studies: VAS [place] LOWER EXTREMITY VENOUS (DVT)  Result Date: 08/17/2020  Lower Venous DVT Study Indications: Edema.  Risk Factors: COVID 19 positive. Limitations: Body habitus, poor ultrasound/tissue interface and patient positioning, patient pain tolerance. Comparison Study: No prior studies. Performing Technologist: Oliver Hum RVT  Examination Guidelines: A complete evaluation includes B-mode imaging, spectral Doppler, color Doppler, and power Doppler as needed of all  accessible portions of each vessel. Bilateral testing is considered an integral part of a complete examination. Limited examinations for reoccurring indications may be performed as noted. The reflux portion of the exam is performed with the patient in reverse Trendelenburg.  +---------+---------------+---------+-----------+----------+--------------+ RIGHT    CompressibilityPhasicitySpontaneityPropertiesThrombus Aging +---------+---------------+---------+-----------+----------+--------------+ CFV      Full           Yes      Yes                                 +---------+---------------+---------+-----------+----------+--------------+ SFJ      Full                                                        +---------+---------------+---------+-----------+----------+--------------+ FV Prox  Full                                                        +---------+---------------+---------+-----------+----------+--------------+ FV Mid   Full                                                        +---------+---------------+---------+-----------+----------+--------------+ FV DistalFull                                                        +---------+---------------+---------+-----------+----------+--------------+  PFV      Full                                                        +---------+---------------+---------+-----------+----------+--------------+ POP      Full           Yes      Yes                                 +---------+---------------+---------+-----------+----------+--------------+ PTV      Full                                                        +---------+---------------+---------+-----------+----------+--------------+ PERO     Full                                                        +---------+---------------+---------+-----------+----------+--------------+    +---------+---------------+---------+-----------+----------+-------------------+ LEFT     CompressibilityPhasicitySpontaneityPropertiesThrombus Aging      +---------+---------------+---------+-----------+----------+-------------------+ CFV      Full           Yes      Yes                                      +---------+---------------+---------+-----------+----------+-------------------+ SFJ      Full                                                             +---------+---------------+---------+-----------+----------+-------------------+ FV Prox  Full                                                             +---------+---------------+---------+-----------+----------+-------------------+ FV Mid   Full                                                             +---------+---------------+---------+-----------+----------+-------------------+ FV Distal               Yes      Yes                                      +---------+---------------+---------+-----------+----------+-------------------+ PFV      Full                                                             +---------+---------------+---------+-----------+----------+-------------------+  POP      Full           Yes      Yes                                      +---------+---------------+---------+-----------+----------+-------------------+ PTV      Full                                                             +---------+---------------+---------+-----------+----------+-------------------+ PERO                                                  Not well visualized +---------+---------------+---------+-----------+----------+-------------------+     Summary: RIGHT: - There is no evidence of deep vein thrombosis in the lower extremity. However, portions of this examination were limited- see technologist comments above.  - No cystic structure found in the popliteal fossa.  LEFT: - There is  no evidence of deep vein thrombosis in the lower extremity. However, portions of this examination were limited- see technologist comments above.  - No cystic structure found in the popliteal fossa.  *See table(s) above for measurements and observations. Electronically signed by Monica Martinez MD on 08/17/2020 at 6:04:17 PM.    Final     Marzetta Board, MD, PhD [facility]  Between 7 am - 7 pm I am available, please contact me via Amion or Securechat  Between 7 pm - 7 am I am not available, please contact night coverage MD/APP via Amion

## 2020-08-19 DIAGNOSIS — R262 Difficulty in walking, not elsewhere classified: Secondary | ICD-10-CM | POA: Diagnosis not present

## 2020-08-19 DIAGNOSIS — N2889 Other specified disorders of kidney and ureter: Secondary | ICD-10-CM | POA: Diagnosis not present

## 2020-08-19 DIAGNOSIS — Z433 Encounter for attention to colostomy: Secondary | ICD-10-CM | POA: Diagnosis not present

## 2020-08-19 DIAGNOSIS — E785 Hyperlipidemia, unspecified: Secondary | ICD-10-CM | POA: Diagnosis not present

## 2020-08-19 DIAGNOSIS — L03119 Cellulitis of unspecified part of limb: Secondary | ICD-10-CM | POA: Diagnosis not present

## 2020-08-19 DIAGNOSIS — R5381 Other malaise: Secondary | ICD-10-CM | POA: Diagnosis not present

## 2020-08-19 DIAGNOSIS — D500 Iron deficiency anemia secondary to blood loss (chronic): Secondary | ICD-10-CM | POA: Diagnosis not present

## 2020-08-19 DIAGNOSIS — J449 Chronic obstructive pulmonary disease, unspecified: Secondary | ICD-10-CM | POA: Diagnosis not present

## 2020-08-19 DIAGNOSIS — U071 COVID-19: Secondary | ICD-10-CM | POA: Diagnosis not present

## 2020-08-19 DIAGNOSIS — N179 Acute kidney failure, unspecified: Secondary | ICD-10-CM | POA: Diagnosis not present

## 2020-08-19 DIAGNOSIS — K436 Other and unspecified ventral hernia with obstruction, without gangrene: Secondary | ICD-10-CM | POA: Diagnosis not present

## 2020-08-19 DIAGNOSIS — M1000 Idiopathic gout, unspecified site: Secondary | ICD-10-CM | POA: Diagnosis not present

## 2020-08-19 DIAGNOSIS — N1832 Chronic kidney disease, stage 3b: Secondary | ICD-10-CM | POA: Diagnosis not present

## 2020-08-19 DIAGNOSIS — J432 Centrilobular emphysema: Secondary | ICD-10-CM | POA: Diagnosis not present

## 2020-08-19 DIAGNOSIS — J9611 Chronic respiratory failure with hypoxia: Secondary | ICD-10-CM | POA: Diagnosis not present

## 2020-08-19 DIAGNOSIS — Z743 Need for continuous supervision: Secondary | ICD-10-CM | POA: Diagnosis not present

## 2020-08-19 DIAGNOSIS — Z8616 Personal history of COVID-19: Secondary | ICD-10-CM | POA: Diagnosis not present

## 2020-08-19 DIAGNOSIS — M2550 Pain in unspecified joint: Secondary | ICD-10-CM | POA: Diagnosis not present

## 2020-08-19 DIAGNOSIS — Z7401 Bed confinement status: Secondary | ICD-10-CM | POA: Diagnosis not present

## 2020-08-19 DIAGNOSIS — D649 Anemia, unspecified: Secondary | ICD-10-CM | POA: Diagnosis not present

## 2020-08-19 DIAGNOSIS — I10 Essential (primary) hypertension: Secondary | ICD-10-CM | POA: Diagnosis not present

## 2020-08-19 DIAGNOSIS — Z933 Colostomy status: Secondary | ICD-10-CM | POA: Diagnosis not present

## 2020-08-19 DIAGNOSIS — I5032 Chronic diastolic (congestive) heart failure: Secondary | ICD-10-CM | POA: Diagnosis not present

## 2020-08-19 DIAGNOSIS — M6281 Muscle weakness (generalized): Secondary | ICD-10-CM | POA: Diagnosis not present

## 2020-08-19 DIAGNOSIS — N390 Urinary tract infection, site not specified: Secondary | ICD-10-CM | POA: Diagnosis not present

## 2020-08-19 DIAGNOSIS — I880 Nonspecific mesenteric lymphadenitis: Secondary | ICD-10-CM | POA: Diagnosis not present

## 2020-08-19 DIAGNOSIS — K3532 Acute appendicitis with perforation and localized peritonitis, without abscess: Secondary | ICD-10-CM | POA: Diagnosis not present

## 2020-08-19 DIAGNOSIS — K630 Abscess of intestine: Secondary | ICD-10-CM | POA: Diagnosis not present

## 2020-08-19 DIAGNOSIS — K651 Peritoneal abscess: Secondary | ICD-10-CM | POA: Diagnosis not present

## 2020-08-19 DIAGNOSIS — I5023 Acute on chronic systolic (congestive) heart failure: Secondary | ICD-10-CM | POA: Diagnosis not present

## 2020-08-19 DIAGNOSIS — R1310 Dysphagia, unspecified: Secondary | ICD-10-CM | POA: Diagnosis not present

## 2020-08-19 LAB — CBC
HCT: 24.8 % — ABNORMAL LOW (ref 36.0–46.0)
Hemoglobin: 7.6 g/dL — ABNORMAL LOW (ref 12.0–15.0)
MCH: 25.2 pg — ABNORMAL LOW (ref 26.0–34.0)
MCHC: 30.6 g/dL (ref 30.0–36.0)
MCV: 82.1 fL (ref 80.0–100.0)
Platelets: 247 10*3/uL (ref 150–400)
RBC: 3.02 MIL/uL — ABNORMAL LOW (ref 3.87–5.11)
RDW: 16.1 % — ABNORMAL HIGH (ref 11.5–15.5)
WBC: 13.1 10*3/uL — ABNORMAL HIGH (ref 4.0–10.5)
nRBC: 0.2 % (ref 0.0–0.2)

## 2020-08-19 LAB — BASIC METABOLIC PANEL
Anion gap: 9 (ref 5–15)
BUN: 27 mg/dL — ABNORMAL HIGH (ref 8–23)
CO2: 27 mmol/L (ref 22–32)
Calcium: 8.7 mg/dL — ABNORMAL LOW (ref 8.9–10.3)
Chloride: 103 mmol/L (ref 98–111)
Creatinine, Ser: 1.38 mg/dL — ABNORMAL HIGH (ref 0.44–1.00)
GFR, Estimated: 39 mL/min — ABNORMAL LOW (ref >60)
Glucose, Bld: 114 mg/dL — ABNORMAL HIGH (ref 70–99)
Potassium: 3.5 mmol/L (ref 3.5–5.1)
Sodium: 139 mmol/L (ref 135–145)

## 2020-08-19 LAB — PREPARE RBC (CROSSMATCH)

## 2020-08-19 MED ORDER — SODIUM CHLORIDE 0.9% IV SOLUTION: Freq: Once | INTRAVENOUS | Status: AC

## 2020-08-19 MED ORDER — SACCHAROMYCES BOULARDII 250 MG PO CAPS: 250.0000 mg | ORAL_CAPSULE | Freq: Two times a day (BID) | ORAL | Status: DC

## 2020-08-19 MED ORDER — AMOXICILLIN-POT CLAVULANATE 875-125 MG PO TABS: 1.0000 | ORAL_TABLET | Freq: Two times a day (BID) | ORAL | 0 refills | Status: AC

## 2020-08-19 NOTE — Progress Notes (Signed)
8 Days Post-Op    CC: abdominal pain  Subjective: She seems a little bit brighter today.  She says she is tolerating diet yesterday but has not eaten today.  Again she has stool and gas in her ostomy bag.  Some of the stools very solid, some more liquidy.  Her ostomy looks fine.  The open wound is clean.  She still has some redness at the base which may be secondary to moisture from her wet-to-dry dressing.  She is in bed and has a wick she still has not been mobilized very much.  She is still on oxygen  Objective: Vital signs in last 24 hours: Temp:  [97.7 F (36.5 C)-98.7 F (37.1 C)] 98.6 F (37 C) (01/21 0508) Pulse Rate:  [70-73] 73 (01/21 0508) Resp:  [16-20] 20 (01/21 0508) BP: (154-192)/(55-87) 169/87 (01/21 0508) SpO2:  [94 %-96 %] 96 % (01/21 0508) Last BM Date: 08/18/20 120PO recorded 100 IV recorded Urine 750 Stool 150 Afebrile, VSS Creatinine 1.38   Intake/Output from previous day: 01/20 0701 - 01/21 0700 In: 220 [IV Piggyback:100] Out: 900 [Urine:750; Stool:150] Intake/Output this shift: No intake/output data recorded.  General appearance: alert, cooperative and no distress Resp: clear to auscultation bilaterally and Anterior GI: Soft, sore, open abdominal wound looks okay.  Still some redness at the base which may be secondary to moisture.  Colostomy is working well with stool and gas in the ostomy bag.  Lab Results:  Recent Labs    08/18/20 0419 08/19/20 0334  WBC 13.4* 13.1*  HGB 7.8* 7.6*  HCT 25.1* 24.8*  PLT 255 247    BMET Recent Labs    08/18/20 0419 08/19/20 0334  NA 140 139  K 3.5 3.5  CL 103 103  CO2 26 27  GLUCOSE 123* 114*  BUN 31* 27*  CREATININE 1.35* 1.38*  CALCIUM 8.6* 8.7*   PT/INR No results for input(s): LABPROT, INR in the last 72 hours.  Recent Labs  Lab 08/13/20 0251 08/15/20 1819  AST 14*  --   ALT 11  --   ALKPHOS 40  --   BILITOT 0.5  --   PROT 5.3*  --   ALBUMIN 2.0* 2.2*     Lipase     Component  Value Date/Time   LIPASE 35 08/08/2020 1756     Medications: . sodium chloride   Intravenous Once  . allopurinol  100 mg Oral Daily  . amLODipine  10 mg Oral Daily  . amoxicillin-clavulanate  1 tablet Oral Q12H  . Chlorhexidine Gluconate Cloth  6 each Topical Daily  . feeding supplement  237 mL Oral BID BM  . fluticasone furoate-vilanterol  1 puff Inhalation Daily  . heparin  5,000 Units Subcutaneous Q8H  . isosorbide-hydrALAZINE  2 tablet Oral BID  . mouth rinse  15 mL Mouth Rinse BID  . metoprolol tartrate  12.5 mg Oral BID  . pravastatin  40 mg Oral q1800  . saccharomyces boulardii  250 mg Oral BID  . sodium chloride flush  10-40 mL Intracatheter Q12H  . sodium chloride flush  5 mL Intracatheter Q8H  . torsemide  20 mg Oral Daily    Assessment/Plan H/O Ulcerative colitis  HTN A fib- not on anticoagulation Cor pulmonale Chronic combined CHF - last ECHO inOct 2021with EF 60-65% PVD COPD/O2 dependence HLD CKDstage III- Cr down from 2.06 > 1.44>>1.38 MGUS OSA Obesity- BMI 35.74 Gout ABL anemia - hgb stable at 8>>7.8>>7.6  COVID positive- per primary, patient has  received 2 doses of vaccine but has not had booster yet.Completed remdesivir  3.5 cm R renal mass- concern for renal cell carcinoma. Urology, Dr. Tresa Moore, has seen and suspectslikely non-metastatic slowly growing renal cancer.Believes this is very unlikelyto become clinically significant at her level of comorbidity. Additionally, curative therapy would have high probability of pushing her to [facility] she adamantly refuses dialysis.Herecommend watchful waiting with consideration of systemic palliative therapy should symptomatic / metastatic disease ever develop, whichhe believesis very unlikely.Hedid NOTrecommend any concomitant kidney surgeryat time of taking to OR.  Chronically incarcerated ventral herniawith mesenteric mass and transversae colon stricture  S/p Exploratory laparotomy,  resection of mesenteric mass, partial colectomy, end colostomy - Dr. Ninfa Linden - 08/11/2020 - POD #8 -Adv to soft diet. Colostomy functioning  - some cellulitis lower portion of open abdominal wound - WOCNfollowingfor new colostomy - Continue WTD dressing changes BID to abdominal wound - Mobilize. PT/OT.Recommending SNF - Monitor wbc. - Cont IV abx. Switched to PO Augmentin 1/20- Needs 10 days post op Abx - Path back as noted below. No malignancy  - Pulm toilet. IS.   FEN -Soft, ensure VTE -SCDs, Heparin subq. Checking LE US's ID -Zosyn 1/11 - 1/17. Unasyn 1/17 -1/22; Augmentin 1/20-1/23 -WBC 39.4(1/14)>> 12.2>> 12.2>> 13.4>>13.1 Foley - Out. Purwick still in place Follow-Up - Dr. Ninfa Linden   Path FINAL MICROSCOPIC DIAGNOSIS:  A. COLON, TRANSVERSE, WITH MESENTERIC MASS, RESECTION:  - Three colonic tubular adenomas.  - No high-grade dysplasia or carcinoma.  - Fibromembranous connective tissue with acutely inflamed granulation  tissue consistent incarcerated hernia  - No evidence of malignancy.    Plan:   Continue wet-to-dry dressings.  Be sure to keep the wet portion within the open wound.  She has a follow-up appointment with Dr. Ninfa Linden on 09/06/2020 at 9:50 AM.  Plan is for her to be transfused today and then transferred to skilled nursing facility later today.  Call if we can be of further assistance.        LOS: 10 days    Dale Ribeiro 08/19/2020 Please see Amion

## 2020-08-19 NOTE — Discharge Summary (Signed)
Physician Discharge Summary  [name]:[id number] DOB: August 09, 1940 DOA: 08/09/2020  PCP: [name], MD  Admit date: 08/09/2020 Discharge date: 08/19/2020  Admitted From: home Disposition:  SNF  Recommendations for Outpatient Follow-up:  1. Follow up with surgery as scheduled 2. Continue Augmentin for 4 additional days to complete total of 10-day course following surgery 3. Please obtain BMP/CBC in one week 4. Please follow up on the following pending results:  Home Health: none Equipment/Devices: none  Discharge Condition: stable CODE STATUS: Full code Diet recommendation: regular  HPI: Per admitting MD, [name] is a 80 y.o. female with medical history significant for HTN, COPD, CKD 3, diastolic CHF morbid obesity who presents with complaint of abdominal pain for the past 3 weeks. She has a chronic ventral hernia which has grown in size the past few weeks and has become more painful. She reports severe pain with even light touch of the hernia. She has had nausea and vomiting as well as subjective fever and chills the last few weeks.  She had some diarrhea 4 days ago which is now resolved and she has not had any bowel movement in the last few days.  She reports the pain has been fairly constant for the last week and has not had any alleviating factors.  She denies any shortness of breath.  She does have a chronic nonproductive cough which is unchanged.  She has had decreased appetite the last week with the pain and nausea.   Hospital Course / Discharge diagnoses: Principal Problem Incarcerated ventral hernia, mesenteric mass, suspected colonic / appendiceal perforation -Initially IR consulted and had to placement on 1/11, eventually had operative management on 1/14 by general surgery with mass resection, colectomy/colostomy.  She recovered well postoperatively, her diet was advanced and she is now tolerating a regular diet.  Initial cultures from IR showed  pansensitive E. coli.  She was maintained on Unasyn postoperatively and will be transitioned to Augmentin on discharge for total of 10 day course following the surgery.  She will have outpatient follow-up with surgery.  Active Problems COVID-19 pneumonia, breakthrough case -Subtle infiltrates on chest x-ray.  Completed a course of remdesivir.  Respiratory status currently appears stable and at baseline. Chronic hypoxic respiratory failure, COPD -Chronically on a couple liters of oxygen at home, continue supplemental oxygen, at baseline, no wheezing Chronic diastolic CHF -2D echo showed grade 2 diastolic dysfunction, preserved LVEF.  Continue home medications on discharge Anemia of critical illness -no bleeding, hemoglobin stable.  Transfused prior to discharge. Acute kidney injury on CKD 3B -Baseline creatinine about 1.5, currently close to baseline Left renal mass -This will be addressed by urology as an outpatient Hyperlipidemia -Continue statin Essential hypertension -Continue home regimen  Obesity -She would benefit from weight loss  Sepsis ruled out   Discharge Instructions   Allergies as of 08/19/2020      Reactions   Celebrex [celecoxib]    edema   Amlodipine Besylate Other (See Comments)   REACTION: edema   Enalapril Cough   Lipitor [atorvastatin]    Muscle aches   Amoxicillin Diarrhea   DID THE REACTION INVOLVE: Swelling of the face/tongue/throat, SOB, or low BP? N Sudden or severe rash/hives, skin peeling, or the inside of the mouth or nose? N Did it require medical treatment? N When did it last happen?MORE THAN 10 YEARS If all above answers are "NO", may proceed with cephalosporin use.   Codeine Sulfate Nausea Only   Hydrocodone-acetaminophen Nausea Only  Medication List    TAKE these medications   acetaminophen 325 MG tablet Commonly known as: TYLENOL Take 325-650 mg by mouth daily as needed for moderate pain, fever or headache.   albuterol 108 (90  Base) MCG/ACT inhaler Commonly known as: ProAir HFA Inhale 1-2 puffs into the lungs every 6 (six) hours as needed for wheezing or shortness of breath.   allopurinol 100 MG tablet Commonly known as: ZYLOPRIM Take 1 tablet (100 mg total) by mouth daily.   amoxicillin-clavulanate 875-125 MG tablet Commonly known as: AUGMENTIN Take 1 tablet by mouth every 12 (twelve) hours for 4 days.   BiDil 20-37.5 MG tablet Generic drug: isosorbide-hydrALAZINE Take 1 tablet by mouth 3 (three) times daily. What changed: when to take this   Breo Ellipta 100-25 MCG/INH Aepb Generic drug: fluticasone furoate-vilanterol INHALE 1 PUFF INTO THE LUNGS DAILY   ferrous sulfate 325 (65 FE) MG tablet Take 1 tablet (325 mg total) by mouth 2 (two) times daily with a meal.   metoprolol tartrate 25 MG tablet Commonly known as: LOPRESSOR Take 1 tablet (25 mg total) by mouth 2 (two) times daily. What changed: how much to take   saccharomyces boulardii 250 MG capsule Commonly known as: FLORASTOR Take 1 capsule (250 mg total) by mouth 2 (two) times daily.   torsemide 20 MG tablet Commonly known as: DEMADEX TAKE 1 TABLET(20 MG) BY MOUTH TWICE DAILY What changed: See the new instructions.   Zypitamag 2 MG Tabs Generic drug: Pitavastatin Magnesium Take 1 tablet by mouth daily.       Contact information for follow-up providers    Coralie Keens, MD Follow up on 09/06/2020.   Specialty: General Surgery Why: Your appointment is at 9:50 AM.  Be at the office 30 minutes early for check in.  Bring Photo ID and insurance information. You need a family/staff member to come with you to the office.   Contact information: [address] [place] [address] [phone]            Contact information for after-discharge care    [facility] .   Service: Skilled Nursing Contact information: [address] [place] [phone]                   Consultations:  General surgery   Procedures/Studies:  Anterior abdominal wall drain placement 21F pigtail drain 08/09/2020 Dr. Earleen Newport 1/14:EXPLORATORY LAPAROTOMY,RESECTION OF MESENTERIC MASS,PARTIAL COLECTOMY,END COLOSTOMY  DG Abdomen 1 View  Result Date: 08/08/2020 CLINICAL DATA:  Abdominal hernia concerns for strangulation. EXAM: ABDOMEN - 1 VIEW COMPARISON:  CT abdomen pelvis February 08, 2020 and abdominal radiograph August 12, 2018. FINDINGS: The bowel gas pattern is normal with gas visualized level of colon. Thoracolumbar spondylosis. IMPRESSION: Nonobstructive bowel gas pattern. Electronically Signed   By: Dahlia Bailiff MD   On: 08/08/2020 18:20   CT ABDOMEN PELVIS W CONTRAST  Result Date: 08/09/2020 CLINICAL DATA:  Persistent abdominal pain from known hernia x3 weeks. EXAM: CT ABDOMEN AND PELVIS WITH CONTRAST TECHNIQUE: Multidetector CT imaging of the abdomen and pelvis was performed using the standard protocol following bolus administration of intravenous contrast. CONTRAST:  34m OMNIPAQUE IOHEXOL 300 MG/ML  SOLN COMPARISON:  February 08, 2020 FINDINGS: Lower chest: There is atelectasis at the lung bases. There is a fat containing Bochdalek hernia on the right.The heart size is normal. Hepatobiliary: The liver is normal. Normal gallbladder.There is no biliary ductal dilation. Pancreas:  Normal contours without ductal dilatation. No peripancreatic fluid collection. Spleen: There is splenomegaly with the spleen measuring approximately 13 cm craniocaudad. Adrenals/Urinary Tract: --Adrenal glands: Unremarkable. --Right kidney/ureter: There is a solid exophytic mass arising from the interpolar region of the right kidney measuring 3.5 cm (axial series 2, image 30). Multiple cysts are noted throughout the right kidney. There is no right-sided hydronephrosis. --Left kidney/ureter: Multiple cysts are noted. --Urinary bladder: Unremarkable. Stomach/Bowel: --Stomach/Duodenum: No  hiatal hernia or other gastric abnormality. Normal duodenal course and caliber. --Small bowel: Unremarkable. --Colon: There is a moderate amount of stool at the level of the rectum. There is scattered colonic diverticula without CT evidence for diverticulitis. Again noted is a large ventral wall hernia containing a loop of the transverse colon in addition to portions of the patient's appendix. There is now a large air and fluid collection in the hernia sac measuring approximately 9.4 x 7.2 cm. There is significant adjacent fat stranding and overlying skin thickening. --Appendix: The visualized portions of the intraperitoneal appendix are unremarkable. The portions that are herniated are difficult to evaluate. Vascular/Lymphatic: Atherosclerotic calcification is present within the non-aneurysmal abdominal aorta, without hemodynamically significant stenosis. --there are few prominent, nonspecific retroperitoneal lymph nodes. --No mesenteric lymphadenopathy. --No pelvic or inguinal lymphadenopathy. Reproductive: Unremarkable Other: Again noted is a large ventral wall hernia containing portions of the transverse colon as and a large volume of peritoneal fat. Musculoskeletal. There is diffuse osteopenia which limits detection of nondisplaced fractures. There is no definite acute displaced fracture identified on today's study. IMPRESSION: 1. Large ventral wall hernia containing a loop of the transverse colon. While there is no evidence for an obstruction, there is a new large air and fluid collection within the hernia sac concerning for perforation of the transverse colon with resultant abscess formation or complicated diverticulitis. Alternatively, a portion of the appendix is located within the hernia sac. As such, the differential includes complicated appendicitis, although this is felt to be less likely. 2. There is a 3.5 cm right renal mass consistent with renal cell carcinoma until proven otherwise. 3. Splenomegaly.  4. Additional chronic findings as detailed above. Aortic Atherosclerosis (ICD10-I70.0). These results were called by telephone at the time of interpretation on 08/09/2020 at 2:57 am to provider DAN FLOYD , who verbally acknowledged these results. Electronically Signed   By: Constance Holster M.D.   On: 08/09/2020 03:00   DG CHEST PORT 1 VIEW  Result Date: 08/12/2020 CLINICAL DATA:  80 year old female COVID-19. EXAM: PORTABLE CHEST 1 VIEW COMPARISON:  Portable chest 08/09/2020 and earlier. FINDINGS: Portable AP semi upright view at 0507 hours. Cardiomegaly and lung volumes are stable but there is increasing veiling opacity at both lung bases. Associated streaky increased lung base opacity. But elsewhere the lungs appear fairly clear when allowing for portable technique. No pneumothorax or edema. Paucity of bowel gas in the upper abdomen. Stable visualized osseous structures. IMPRESSION: 1. Small but increasing bilateral pleural effusions since 08/09/2020. Underlying cardiomegaly but no overt edema. 2. Associated streaky new lung base opacity could be atelectasis or COVID-19 pneumonia in this setting. Electronically Signed   By: Genevie Ann M.D.   On: 08/12/2020 06:48   DG CHEST PORT 1 VIEW  Result Date: 08/09/2020 CLINICAL DATA:  80 year old female with history of COVID infection with increasing shortness of breath. EXAM: PORTABLE CHEST 1 VIEW COMPARISON:  Chest x-ray 01/01/2018. FINDINGS: Ill-defined opacities and areas of mild interstitial prominence are noted throughout the mid to lower lungs bilaterally. Small left pleural effusion  or chronic pleuroparenchymal scarring, stable compared to remote prior study from 2019. No pneumothorax. No evidence of pulmonary edema. Heart size is mildly enlarged. Upper mediastinal contours are within normal limits. Aortic atherosclerosis. IMPRESSION: 1. There are subtle findings in the mid to lower lungs which suggest developing multilobar bilateral pneumonia, compatible with  reported COVID infection. 2. Mild cardiomegaly. 3. Aortic atherosclerosis. Electronically Signed   By: Vinnie Langton M.D.   On: 08/09/2020 09:14   [place] IMAGE GUIDED FLUID DRAIN BY CATHETER  Result Date: 08/09/2020 INDICATION: 80 year old female with a history incarcerated ventral hernia containing colon and appendix, now with abscess, presumably from appendicitis EXAM: ULTRASOUND-GUIDED DRAINAGE OF ABDOMINAL WALL ABSCESS MEDICATIONS: The patient is currently admitted to the hospital and receiving intravenous antibiotics. The antibiotics were administered within an appropriate time frame prior to the initiation of the procedure. ANESTHESIA/SEDATION: Fentanyl 100 mcg IV; Versed 1.0 mg IV Moderate Sedation Time:  12 minutes The patient was continuously monitored during the procedure by the interventional radiology nurse under my direct supervision. COMPLICATIONS: None PROCEDURE: Informed written consent was obtained from the patient after a thorough discussion of the procedural risks, benefits and alternatives. All questions were addressed. Maximal Sterile Barrier Technique was utilized including caps, mask, sterile gowns, sterile gloves, sterile drape, hand hygiene and skin antiseptic. A timeout was performed prior to the initiation of the procedure. Patient positioned supine position on the ultrasound gantry. Scout images were acquired for planning purposes. The patient is prepped and draped in the usual sterile fashion. 1% lidocaine was used for local anesthesia. Once the patient was anesthetized, small stab incision was made with 11 blade scalpel. Trocar technique was used to place a 12 [place] drain into the abscess with complex fluid in the ventral wall. The 12 French catheter was advanced from the trocar and formed in the abscess. Approximately 150 cc of foul-smelling dark fluid aspirated. Culture was sent. Catheter was sutured in position and a final image was stored. Catheter was attached to bulb suction.  Patient tolerated the procedure well and remained hemodynamically stable throughout. No complications were encountered and no significant blood loss. IMPRESSION: Status post ultrasound-guided drainage of ventral wall abscess with sample sent for culture. Signed, Dulcy Fanny. Dellia Nims, RPVI [facility] Electronically Signed   By: Corrie Mckusick D.O.   On: 08/09/2020 16:27   VAS [place] LOWER EXTREMITY VENOUS (DVT)  Result Date: 08/17/2020  Lower Venous DVT Study Indications: Edema.  Risk Factors: COVID 19 positive. Limitations: Body habitus, poor ultrasound/tissue interface and patient positioning, patient pain tolerance. Comparison Study: No prior studies. Performing Technologist: Oliver Hum RVT  Examination Guidelines: A complete evaluation includes B-mode imaging, spectral Doppler, color Doppler, and power Doppler as needed of all accessible portions of each vessel. Bilateral testing is considered an integral part of a complete examination. Limited examinations for reoccurring indications may be performed as noted. The reflux portion of the exam is performed with the patient in reverse Trendelenburg.  +---------+---------------+---------+-----------+----------+--------------+ RIGHT    CompressibilityPhasicitySpontaneityPropertiesThrombus Aging +---------+---------------+---------+-----------+----------+--------------+ CFV      Full           Yes      Yes                                 +---------+---------------+---------+-----------+----------+--------------+ SFJ      Full                                                        +---------+---------------+---------+-----------+----------+--------------+  FV Prox  Full                                                        +---------+---------------+---------+-----------+----------+--------------+ FV Mid   Full                                                         +---------+---------------+---------+-----------+----------+--------------+ FV DistalFull                                                        +---------+---------------+---------+-----------+----------+--------------+ PFV      Full                                                        +---------+---------------+---------+-----------+----------+--------------+ POP      Full           Yes      Yes                                 +---------+---------------+---------+-----------+----------+--------------+ PTV      Full                                                        +---------+---------------+---------+-----------+----------+--------------+ PERO     Full                                                        +---------+---------------+---------+-----------+----------+--------------+   +---------+---------------+---------+-----------+----------+-------------------+ LEFT     CompressibilityPhasicitySpontaneityPropertiesThrombus Aging      +---------+---------------+---------+-----------+----------+-------------------+ CFV      Full           Yes      Yes                                      +---------+---------------+---------+-----------+----------+-------------------+ SFJ      Full                                                             +---------+---------------+---------+-----------+----------+-------------------+ FV Prox  Full                                                             +---------+---------------+---------+-----------+----------+-------------------+  FV Mid   Full                                                             +---------+---------------+---------+-----------+----------+-------------------+ FV Distal               Yes      Yes                                      +---------+---------------+---------+-----------+----------+-------------------+ PFV      Full                                                              +---------+---------------+---------+-----------+----------+-------------------+ POP      Full           Yes      Yes                                      +---------+---------------+---------+-----------+----------+-------------------+ PTV      Full                                                             +---------+---------------+---------+-----------+----------+-------------------+ PERO                                                  Not well visualized +---------+---------------+---------+-----------+----------+-------------------+     Summary: RIGHT: - There is no evidence of deep vein thrombosis in the lower extremity. However, portions of this examination were limited- see technologist comments above.  - No cystic structure found in the popliteal fossa.  LEFT: - There is no evidence of deep vein thrombosis in the lower extremity. However, portions of this examination were limited- see technologist comments above.  - No cystic structure found in the popliteal fossa.  *See table(s) above for measurements and observations. Electronically signed by Monica Martinez MD on 08/17/2020 at 74:04:17 PM.    Final      Subjective: - no chest pain, shortness of breath, no abdominal pain, nausea or vomiting.   Discharge Exam: BP (!) 169/87 (BP Location: Left Arm)   Pulse 73   Temp 98.6 F (37 C) (Oral)   Resp 20   Ht 5' (1.524 m)   Wt 86.1 kg   SpO2 96%   BMI 37.07 kg/m   General: Pt is alert, awake, not in acute distress Cardiovascular: RRR, S1/S2 +, no rubs, no gallops Respiratory: CTA bilaterally, no wheezing, no rhonchi Abdominal: Soft, NT, ND, bowel sounds + Extremities: no edema, no cyanosis   The results of significant diagnostics from this hospitalization (including imaging, microbiology, ancillary and laboratory) are  listed below for reference.     Microbiology: Recent Results (from the past 240 hour(s))  Aerobic/Anaerobic Culture  (surgical/deep wound)     Status: None   Collection Time: 08/09/20  3:51 PM   Specimen: Abscess  Result Value Ref Range Status   Specimen Description   Final    ABSCESS Performed at [facility] [address], [place], [place] [address]    Special Requests   Final    NONE Performed at [facility], [place] [address] [place], [place] [address]    Gram Stain   Final    NO WBC SEEN ABUNDANT GRAM POSITIVE COCCI ABUNDANT GRAM NEGATIVE RODS ABUNDANT GRAM POSITIVE RODS ABUNDANT GRAM VARIABLE ROD Performed at [facility], [place] [address] [place], [place] [address]    Culture   Final    MODERATE ACTINOMYCES ODONTOLYTICUS RARE ESCHERICHIA COLI MIXED ANAEROBIC FLORA PRESENT.  CALL LAB IF FURTHER IID REQUIRED.    Report Status 08/15/2020 FINAL  Final   Organism ID, Bacteria ESCHERICHIA COLI  Final      Susceptibility   Escherichia coli - MIC*    AMPICILLIN 4 SENSITIVE Sensitive     CEFAZOLIN <=4 SENSITIVE Sensitive     CEFEPIME <=0.12 SENSITIVE Sensitive     CEFTAZIDIME <=1 SENSITIVE Sensitive     CEFTRIAXONE <=0.25 SENSITIVE Sensitive     CIPROFLOXACIN <=0.25 SENSITIVE Sensitive     GENTAMICIN <=1 SENSITIVE Sensitive     IMIPENEM <=0.25 SENSITIVE Sensitive     TRIMETH/SULFA <=20 SENSITIVE Sensitive     AMPICILLIN/SULBACTAM <=2 SENSITIVE Sensitive     PIP/TAZO <=4 SENSITIVE Sensitive     * RARE ESCHERICHIA COLI  MRSA PCR Screening     Status: None   Collection Time: 08/11/20  4:33 PM   Specimen: Nasal Mucosa; Nasopharyngeal  Result Value Ref Range Status   MRSA by PCR NEGATIVE NEGATIVE Final    Comment:        The GeneXpert MRSA Assay (FDA approved for NASAL specimens only), is one component of a comprehensive MRSA colonization surveillance program. It is not intended to diagnose MRSA infection nor to guide or monitor treatment for MRSA infections. Performed at [facility], [place] [address] [place],  [place] [address]      Labs: Basic Metabolic Panel: Recent Labs  Lab 08/13/20 0251 08/14/20 0625 08/15/20 0500 08/15/20 1819 08/17/20 0402 08/18/20 0419 08/19/20 0334  NA 137 139 141 140 142 140 139  K 3.5 3.9 3.8 3.5 3.4* 3.5 3.5  CL 103 103 104 104 105 103 103  CO2 22 25 25 25 27 26 27   GLUCOSE 129* 84 102* 112* 156* 123* 114*  BUN 64* 61* 59* 52* 39* 31* 27*  CREATININE 2.94* 2.64* 2.29* 2.06* 1.44* 1.35* 1.38*  CALCIUM 8.2* 8.6* 8.6* 8.6* 8.9 8.6* 8.7*  MG  --  2.3  --   --   --   --   --   PHOS 4.2 3.6  --  2.7  --   --   --    Liver Function Tests: Recent Labs  Lab 08/13/20 0251 08/15/20 1819  AST 14*  --   ALT 11  --   ALKPHOS 40  --   BILITOT 0.5  --   PROT 5.3*  --   ALBUMIN 2.0* 2.2*   CBC: Recent Labs  Lab 08/15/20 0500 08/16/20 0345 08/17/20 0402 08/18/20 0419 08/19/20 0334  WBC 11.8* 12.2* 12.0* 13.4* 13.1*  HGB  7.9* 7.8* 8.0* 7.8* 7.6*  HCT 24.9* 25.1* 25.8* 25.1* 24.8*  MCV 79.6* 80.2 81.1 81.5 82.1  PLT 250 270 248 255 247   CBG: Recent Labs  Lab 08/16/20 0804  GLUCAP 129*   Hgb A1c No results for input(s): HGBA1C in the last 72 hours. Lipid Profile No results for input(s): CHOL, HDL, LDLCALC, TRIG, CHOLHDL, LDLDIRECT in the last 72 hours. Thyroid function studies No results for input(s): TSH, T4TOTAL, T3FREE, THYROIDAB in the last 72 hours.  Invalid input(s): FREET3 Urinalysis    Component Value Date/Time   COLORURINE YELLOW 08/09/2020 0700   APPEARANCEUR HAZY (A) 08/09/2020 0700   LABSPEC 1.034 (H) 08/09/2020 0700   PHURINE 5.0 08/09/2020 0700   GLUCOSEU NEGATIVE 08/09/2020 0700   GLUCOSEU NEGATIVE 11/05/2017 1103   HGBUR NEGATIVE 08/09/2020 0700   HGBUR moderate 02/02/2008 1439   BILIRUBINUR NEGATIVE 08/09/2020 0700   KETONESUR NEGATIVE 08/09/2020 0700   PROTEINUR 30 (A) 08/09/2020 0700   UROBILINOGEN 0.2 11/05/2017 1103   NITRITE NEGATIVE 08/09/2020 0700   LEUKOCYTESUR NEGATIVE 08/09/2020 0700    FURTHER DISCHARGE  INSTRUCTIONS:   Get Medicines reviewed and adjusted: Please take all your medications with you for your next visit with your Primary MD   Laboratory/radiological data: Please request your Primary MD to go over all hospital tests and procedure/radiological results at the follow up, please ask your Primary MD to get all Hospital records sent to his/her office.   In some cases, they will be blood work, cultures and biopsy results pending at the time of your discharge. Please request that your primary care M.D. goes through all the records of your hospital data and follows up on these results.   Also Note the following: If you experience worsening of your admission symptoms, develop shortness of breath, life threatening emergency, suicidal or homicidal thoughts you must seek medical attention immediately by calling 911 or calling your MD immediately  if symptoms less severe.   You must read complete instructions/literature along with all the possible adverse reactions/side effects for all the Medicines you take and that have been prescribed to you. Take any new Medicines after you have completely understood and accpet all the possible adverse reactions/side effects.    Do not drive when taking Pain medications or sleeping medications (Benzodaizepines)   Do not take more than prescribed Pain, Sleep and Anxiety Medications. It is not advisable to combine anxiety,sleep and pain medications without talking with your primary care practitioner   Special Instructions: If you have smoked or chewed Tobacco  in the last 2 yrs please stop smoking, stop any regular Alcohol  and or any Recreational drug use.   Wear Seat belts while driving.   Please note: You were cared for by a hospitalist during your hospital stay. Once you are discharged, your primary care physician will handle any further medical issues. Please note that NO REFILLS for any discharge medications will be authorized once you are discharged,  as it is imperative that you return to your primary care physician (or establish a relationship with a primary care physician if you do not have one) for your post hospital discharge needs so that they can reassess your need for medications and monitor your lab values.  Time coordinating discharge: 40 minutes  SIGNED:  Marzetta Board, MD, PhD 08/19/2020, 9:58 AM

## 2020-08-19 NOTE — Care Management Important Message (Signed)
Important Message  Patient Details IM Letter placed in Patient's door Caddy.  Name: [name] MRN: [id number] Date of Birth: [birth year]   Medicare Important Message Given:  Yes     Kerin Salen 08/19/2020, 10:33 AM

## 2020-08-19 NOTE — Progress Notes (Signed)
ptar at bedside to transfer patient to [place]  Patient verbalized good understanding of discharge plan

## 2020-08-19 NOTE — TOC Transition Note (Signed)
Transition of Care [facility]) - CM/SW Discharge Note   Patient Details  Name: [name] MRN: [id number] Date of Birth: Oct 21, 1940  [facility] CM/SW Contact:  Trish Mage, LCSW Phone Number: 08/19/2020, 8:21 AM   Clinical Narrative:  Received call back from [employer] with auth details.  Reference # [id number], Auth # [id number]  Good 5 days  Next review is 1/25 with Tiffany.  FAX [phone].   Patient is stable for discharge, will transition to [facility] today.  Family informed.  PTAR arranged.  Nursing, please call report to [phone], room 309. TOC sign off.    Final next level of care: Skilled Nursing Facility Barriers to Discharge: Barriers Resolved   Patient Goals and CMS Choice     Choice offered to / list presented to : Patient  Discharge Placement                       Discharge Plan and Services   Discharge Planning Services: CM Consult Post Acute Care Choice: [place]                               Social Determinants of Health (SDOH) Interventions     Readmission Risk Interventions No flowsheet data found.

## 2020-08-19 NOTE — Consult Note (Addendum)
[place] Nurse ostomy consult note Surgical team following for assessment and plan of care for abd wound.  Pt had colostomy surgery on 1/13.  She is in isolation for Covid.  Stoma type/location: Stoma is red and viable, above skin level, 2 inches and edematous Peristomal assessment: intact skin surrounding Output: mod amt unformed brown stool in the pouch Ostomy pouching: 1pc flexible Kellie Simmering # [phone]) and barrier ring Kellie Simmering # [phone]) Education provided:  Discussed pouch activities with patient.  She appeared to have limited understanding regarding pouch function. She did not watch, participate, or understand the pouch change procedure. Pt will need total assistance with pouch application and emptying after discharge; plans for discharge to SNF, according to surgical team progress notes.  No family present today for teaching session. Applied barrier ring and one piece flexible pouch. 6 sets of barrier rings and pouches left at the bedside for staff nurse use; along with educational materials.  Enrolled patient in [employer] program: Yes; previously Julien Girt MSN, [place], [place], [place], [place]

## 2020-08-19 NOTE — Progress Notes (Signed)
Patient will be discharging to SNF later today via PTAR. Belongings were returned.

## 2020-08-20 LAB — BPAM RBC
Blood Product Expiration Date: 202202162359
ISSUE DATE / TIME: 202201211147
Unit Type and Rh: 5100

## 2020-08-20 LAB — TYPE AND SCREEN
ABO/RH(D): O POS
Antibody Screen: NEGATIVE
Donor AG Type: NEGATIVE
Unit division: 0

## 2020-08-23 DIAGNOSIS — K436 Other and unspecified ventral hernia with obstruction, without gangrene: Secondary | ICD-10-CM | POA: Diagnosis not present

## 2020-08-23 DIAGNOSIS — N179 Acute kidney failure, unspecified: Secondary | ICD-10-CM | POA: Diagnosis not present

## 2020-08-23 DIAGNOSIS — J9611 Chronic respiratory failure with hypoxia: Secondary | ICD-10-CM | POA: Diagnosis not present

## 2020-08-23 DIAGNOSIS — J449 Chronic obstructive pulmonary disease, unspecified: Secondary | ICD-10-CM | POA: Diagnosis not present

## 2020-08-23 DIAGNOSIS — U071 COVID-19: Secondary | ICD-10-CM | POA: Diagnosis not present

## 2020-08-23 DIAGNOSIS — I5032 Chronic diastolic (congestive) heart failure: Secondary | ICD-10-CM | POA: Diagnosis not present

## 2020-08-23 DIAGNOSIS — D649 Anemia, unspecified: Secondary | ICD-10-CM | POA: Diagnosis not present

## 2020-08-23 DIAGNOSIS — Z433 Encounter for attention to colostomy: Secondary | ICD-10-CM | POA: Diagnosis not present

## 2020-08-24 DIAGNOSIS — L03119 Cellulitis of unspecified part of limb: Secondary | ICD-10-CM | POA: Diagnosis not present

## 2020-08-24 DIAGNOSIS — J449 Chronic obstructive pulmonary disease, unspecified: Secondary | ICD-10-CM | POA: Diagnosis not present

## 2020-08-26 DIAGNOSIS — U071 COVID-19: Secondary | ICD-10-CM | POA: Diagnosis not present

## 2020-08-26 DIAGNOSIS — Z433 Encounter for attention to colostomy: Secondary | ICD-10-CM | POA: Diagnosis not present

## 2020-08-26 DIAGNOSIS — I5032 Chronic diastolic (congestive) heart failure: Secondary | ICD-10-CM | POA: Diagnosis not present

## 2020-08-26 DIAGNOSIS — N179 Acute kidney failure, unspecified: Secondary | ICD-10-CM | POA: Diagnosis not present

## 2020-08-26 DIAGNOSIS — J9611 Chronic respiratory failure with hypoxia: Secondary | ICD-10-CM | POA: Diagnosis not present

## 2020-08-26 DIAGNOSIS — D649 Anemia, unspecified: Secondary | ICD-10-CM | POA: Diagnosis not present

## 2020-08-26 DIAGNOSIS — K436 Other and unspecified ventral hernia with obstruction, without gangrene: Secondary | ICD-10-CM | POA: Diagnosis not present

## 2020-08-26 DIAGNOSIS — J449 Chronic obstructive pulmonary disease, unspecified: Secondary | ICD-10-CM | POA: Diagnosis not present

## 2020-09-07 ENCOUNTER — Telehealth: Payer: Self-pay | Admitting: Internal Medicine

## 2020-09-07 NOTE — Telephone Encounter (Signed)
Tamra w/ [employer] called and was wondering if the patient should continue taking Lactobacillus. Also, she said that the patient has not filled Pitavastatin Magnesium (ZYPITAMAG) 2 MG TABS.

## 2020-09-08 ENCOUNTER — Other Ambulatory Visit: Payer: Self-pay | Admitting: *Deleted

## 2020-09-08 NOTE — Telephone Encounter (Signed)
[name] w/ [employer] called and was wondering if the patient should continue taking Lactobacillus. - yes    Also, she said that the patient has not filled Pitavastatin Magnesium (ZYPITAMAG) 2 MG TABS. - OK

## 2020-09-08 NOTE — Patient Outreach (Signed)
[place]) Care Management  09/08/2020  [name] 02-17-1941 [id number]  Telephone outreach for [employer] on [employer]: Doesn't know who to call if has a problem, has questions and her wound is not healing.  Spoke to [name] who denies getting the [facility] calls. She says she is not interested in our program after explaining the services.  Will send successful outreach letter.  Eulah Pont. Myrtie Neither, MSN, [facility] Gerontological Nurse Practitioner [facility] Care Management [phone]

## 2020-09-08 NOTE — Telephone Encounter (Signed)
Is she in hospice or palliative care?

## 2020-09-08 NOTE — Telephone Encounter (Signed)
[employer] called & PCP notes given.    Clarification needed on Pitavastatin: does the pt need to be on another statin? Or does she need to fill the Pitavastatin.  According to CCM note on 06/27/20, Rosuvastatin recommended b/c pt counld not afford Livalo 8m.

## 2020-09-14 ENCOUNTER — Telehealth: Payer: Self-pay | Admitting: Pharmacist

## 2020-09-14 NOTE — Progress Notes (Signed)
Chronic Care Management Pharmacy Assistant   Name: HAYLEIGH BAWA  MRN: [id number] DOB: 1940/11/24  Reason for Encounter: Hypertension & General Adherence Call   PCP : [name], MD  Allergies:   Allergies  Allergen Reactions   Celebrex [Celecoxib]     edema   Amlodipine Besylate Other (See Comments)    REACTION: edema   Enalapril Cough   Lipitor [Atorvastatin]     Muscle aches   Amoxicillin Diarrhea    DID THE REACTION INVOLVE: Swelling of the face/tongue/throat, SOB, or low BP? N Sudden or severe rash/hives, skin peeling, or the inside of the mouth or nose? N Did it require medical treatment? N When did it last happen?MORE THAN 10 YEARS If all above answers are "NO", may proceed with cephalosporin use.    Codeine Sulfate Nausea Only   Hydrocodone-Acetaminophen Nausea Only    Medications: Outpatient Encounter Medications as of 09/14/2020  Medication Sig Note   acetaminophen (TYLENOL) 325 MG tablet Take 325-650 mg by mouth daily as needed for moderate pain, fever or headache.     albuterol (PROAIR HFA) 108 (90 Base) MCG/ACT inhaler Inhale 1-2 puffs into the lungs every 6 (six) hours as needed for wheezing or shortness of breath.    allopurinol (ZYLOPRIM) 100 MG tablet Take 1 tablet (100 mg total) by mouth daily.    ferrous sulfate 325 (65 FE) MG tablet Take 1 tablet (325 mg total) by mouth 2 (two) times daily with a meal.    fluticasone furoate-vilanterol (BREO ELLIPTA) 100-25 MCG/INH AEPB INHALE 1 PUFF INTO THE LUNGS DAILY    isosorbide-hydrALAZINE (BIDIL) 20-37.5 MG tablet Take 1 tablet by mouth 3 (three) times daily. (Patient taking differently: Take 1 tablet by mouth 2 (two) times daily.) 06/27/2020: Takes BID due to cost   metoprolol tartrate (LOPRESSOR) 25 MG tablet Take 1 tablet (25 mg total) by mouth 2 (two) times daily. (Patient taking differently: Take 12.5 mg by mouth 2 (two) times daily.)    Pitavastatin Magnesium (ZYPITAMAG) 2 MG  TABS Take 1 tablet by mouth daily.    saccharomyces boulardii (FLORASTOR) 250 MG capsule Take 1 capsule (250 mg total) by mouth 2 (two) times daily.    torsemide (DEMADEX) 20 MG tablet TAKE 1 TABLET(20 MG) BY MOUTH TWICE DAILY (Patient taking differently: Take 20 mg by mouth 2 (two) times daily.)    No facility-administered encounter medications on file as of 09/14/2020.    Current Diagnosis: Patient Active Problem List   Diagnosis Date Noted   Shock circulatory ([place])    Irreducible ventral hernia 08/09/2020   Infection as cause of abscess of colon 08/09/2020   COPD (chronic obstructive pulmonary disease) ([place]) 08/09/2020   Left lower quadrant abdominal tenderness without rebound tenderness 02/06/2020   Chronic respiratory failure with hypoxia ([place]) 02/06/2020   Routine general medical examination at a health care facility 93/57/0177   Umbilical hernia with obstruction 08/27/2018   AVM (arteriovenous malformation) of small bowel, acquired with hemorrhage 08/14/2018   Hernia, ventral 08/12/2018   Multiple lung nodules on CT 05/05/2018   Stenosis of cervical spine with myelopathy ([place]) 01/01/2018   Atrial fibrillation ([place]) 10/17/2017   CHF (congestive heart failure), NYHA class III, chronic, diastolic ([place]) 93/90/3009   Symptomatic anemia 08/10/2017   Angiodysplasia of cecum    Angiodysplasia of duodenum    Grade III diastolic dysfunction 23/30/0762   MGUS (monoclonal gammopathy of unknown significance) 11/08/2016   Asteatotic eczema 02/25/2016   COLD (chronic obstructive lung  disease) ([place])    Anemia, iron deficiency 11/16/2014   Cor pulmonale ([place]) 11/16/2014   OSA (obstructive sleep apnea) 06/22/2013   Chronic venous stasis dermatitis of both lower extremities 06/02/2013   Vitamin D deficiency 02/06/2013   Osteopenia, senile 02/05/2013   Other screening mammogram 02/05/2013   Chronic kidney disease, stage 3b ([place]) 06/20/2010   Morbid obesity  ([place]) 04/19/2010   Gout 01/10/2009   ULCERATIVE COLITIS-LEFT SIDE 03/19/2008   Vitamin B12 deficiency anemia 11/13/2006   Hyperlipidemia with target LDL less than 100 11/27/1996   GERD 07/30/1992   HTN (hypertension) 07/30/1988    Goals Addressed   None     Follow-Up:  Pharmacist Review   Reviewed chart prior to disease state call. Spoke with patient regarding BP  Recent Office Vitals: BP Readings from Last 3 Encounters:  08/19/20 (!) 160/60  08/03/20 (!) 166/62  05/23/20 (!) 186/68   Pulse Readings from Last 3 Encounters:  08/19/20 74  08/03/20 73  05/23/20 80    Wt Readings from Last 3 Encounters:  08/09/20 189 lb 13.1 oz (86.1 kg)  08/03/20 183 lb 6.4 oz (83.2 kg)  05/23/20 196 lb (88.9 kg)     Kidney Function Lab Results  Component Value Date/Time   CREATININE 1.38 (H) 08/19/2020 03:34 AM   CREATININE 1.35 (H) 08/18/2020 04:19 AM   CREATININE 1.36 (H) 02/04/2020 02:43 PM   CREATININE 1.1 04/11/2016 10:07 AM   CREATININE 1.5 (H) 04/05/2015 01:02 PM   GFR 37.48 (L) 11/12/2019 05:02 PM   GFRNONAA 39 (L) 08/19/2020 03:34 AM   GFRAA 30 (L) 02/19/2020 01:59 PM    BMP Latest Ref Rng & Units 08/19/2020 08/18/2020 08/17/2020  Glucose 70 - 99 mg/dL 114(H) 123(H) 156(H)  BUN 8 - 23 mg/dL 27(H) 31(H) 39(H)  Creatinine 0.44 - 1.00 mg/dL 1.38(H) 1.35(H) 1.44(H)  BUN/Creat Ratio 6 - 22 (calc) - - -  Sodium 135 - 145 mmol/L 139 140 142  Potassium 3.5 - 5.1 mmol/L 3.5 3.5 3.4(L)  Chloride 98 - 111 mmol/L 103 103 105  CO2 22 - 32 mmol/L 27 26 27   Calcium 8.9 - 10.3 mg/dL 8.7(L) 8.6(L) 8.9     Current antihypertensive regimen: The patient blood pressure regimen is metoprolol tartrate, isosorbide and torsemide   How often are you checking your Blood Pressure? The patient states that she does not take blood pressure at home but the last few times she had appointments her blood pressure was elevated   Current home BP readings: The patient does not check blood  pressure at home   What recent interventions/DTPs have been made by any provider to improve Blood Pressure control since last CPP Visit:  The patient states that she knows that she should be checking blood pressure at home and watching salt intake   Any recent hospitalizations or ED visits since last visit with CPP? The patient stated that she was in the hospital for hernia surgery in January   What diet changes have been made to improve Blood Pressure Control? The patient states that she is on a low sodium diet to help lower blood pressure     What exercise is being done to improve your Blood Pressure Control? The patient states tat she has not been able to exercise   Adherence Review: Is the patient currently on ACE/ARB medication? No Does the patient have >5 day gap between last estimated fill dates? No   Wendy Poet, Clinical Pharmacist Assistant [facility] [phone]  Total time:30

## 2020-09-20 DIAGNOSIS — Z933 Colostomy status: Secondary | ICD-10-CM | POA: Diagnosis not present

## 2020-09-24 DIAGNOSIS — J449 Chronic obstructive pulmonary disease, unspecified: Secondary | ICD-10-CM | POA: Diagnosis not present

## 2020-09-24 DIAGNOSIS — L03119 Cellulitis of unspecified part of limb: Secondary | ICD-10-CM | POA: Diagnosis not present

## 2020-09-26 NOTE — Telephone Encounter (Signed)
Pt is not in hospice or palliative care. Has appt with PCP on 09/28/20 & planned to discuss the medications at that time.

## 2020-09-28 ENCOUNTER — Other Ambulatory Visit: Payer: Self-pay

## 2020-09-28 ENCOUNTER — Ambulatory Visit (INDEPENDENT_AMBULATORY_CARE_PROVIDER_SITE_OTHER): Payer: Medicare Other | Admitting: Internal Medicine

## 2020-09-28 ENCOUNTER — Encounter: Payer: Self-pay | Admitting: Internal Medicine

## 2020-09-28 VITALS — BP 142/86 | HR 65 | Temp 98.0°F | Ht 60.0 in | Wt 181.0 lb

## 2020-09-28 DIAGNOSIS — E785 Hyperlipidemia, unspecified: Secondary | ICD-10-CM | POA: Diagnosis not present

## 2020-09-28 DIAGNOSIS — D649 Anemia, unspecified: Secondary | ICD-10-CM

## 2020-09-28 DIAGNOSIS — N1832 Chronic kidney disease, stage 3b: Secondary | ICD-10-CM | POA: Diagnosis not present

## 2020-09-28 DIAGNOSIS — K5150 Left sided colitis without complications: Secondary | ICD-10-CM | POA: Diagnosis not present

## 2020-09-28 DIAGNOSIS — R300 Dysuria: Secondary | ICD-10-CM | POA: Insufficient documentation

## 2020-09-28 DIAGNOSIS — D510 Vitamin B12 deficiency anemia due to intrinsic factor deficiency: Secondary | ICD-10-CM | POA: Diagnosis not present

## 2020-09-28 LAB — CBC WITH DIFFERENTIAL/PLATELET
Basophils Absolute: 0 10*3/uL (ref 0.0–0.1)
Basophils Relative: 0.6 % (ref 0.0–3.0)
Eosinophils Absolute: 0.2 10*3/uL (ref 0.0–0.7)
Eosinophils Relative: 2.6 % (ref 0.0–5.0)
HCT: 26.7 % — ABNORMAL LOW (ref 36.0–46.0)
Hemoglobin: 8.9 g/dL — ABNORMAL LOW (ref 12.0–15.0)
Lymphocytes Relative: 21.9 % (ref 12.0–46.0)
Lymphs Abs: 1.8 10*3/uL (ref 0.7–4.0)
MCHC: 33.5 g/dL (ref 30.0–36.0)
MCV: 80.2 fl (ref 78.0–100.0)
Monocytes Absolute: 0.7 10*3/uL (ref 0.1–1.0)
Monocytes Relative: 8.6 % (ref 3.0–12.0)
Neutro Abs: 5.4 10*3/uL (ref 1.4–7.7)
Neutrophils Relative %: 66.3 % (ref 43.0–77.0)
Platelets: 238 10*3/uL (ref 150.0–400.0)
RBC: 3.33 Mil/uL — ABNORMAL LOW (ref 3.87–5.11)
RDW: 20.2 % — ABNORMAL HIGH (ref 11.5–15.5)
WBC: 8.1 10*3/uL (ref 4.0–10.5)

## 2020-09-28 LAB — URINALYSIS, ROUTINE W REFLEX MICROSCOPIC
Bilirubin Urine: NEGATIVE
Hgb urine dipstick: NEGATIVE
Ketones, ur: NEGATIVE
Nitrite: NEGATIVE
RBC / HPF: NONE SEEN (ref 0–?)
Specific Gravity, Urine: 1.01 (ref 1.000–1.030)
Total Protein, Urine: NEGATIVE
Urine Glucose: NEGATIVE
Urobilinogen, UA: 0.2 (ref 0.0–1.0)
pH: 6 (ref 5.0–8.0)

## 2020-09-28 LAB — BASIC METABOLIC PANEL
BUN: 31 mg/dL — ABNORMAL HIGH (ref 6–23)
CO2: 31 mEq/L (ref 19–32)
Calcium: 9.8 mg/dL (ref 8.4–10.5)
Chloride: 101 mEq/L (ref 96–112)
Creatinine, Ser: 1.34 mg/dL — ABNORMAL HIGH (ref 0.40–1.20)
GFR: 37.53 mL/min — ABNORMAL LOW (ref >60.00)
Glucose, Bld: 98 mg/dL (ref 70–99)
Potassium: 4.6 mEq/L (ref 3.5–5.1)
Sodium: 136 mEq/L (ref 135–145)

## 2020-09-28 LAB — IRON: Iron: 66 ug/dL (ref 42–145)

## 2020-09-28 MED ORDER — ZYPITAMAG 2 MG PO TABS: 1.0000 | ORAL_TABLET | Freq: Every day | ORAL | 1 refills | Status: DC

## 2020-09-28 MED ORDER — SACCHAROMYCES BOULARDII 250 MG PO CAPS: 250.0000 mg | ORAL_CAPSULE | Freq: Two times a day (BID) | ORAL | 1 refills | Status: DC

## 2020-09-28 NOTE — Patient Instructions (Signed)
[place] medicine (25th ed., pp. [phone]). [place], [place]: Elsevier.">  Anemia  Anemia is a condition in which there is not enough red blood cells or hemoglobin in the blood. Hemoglobin is a substance in red blood cells that carries oxygen. When you do not have enough red blood cells or hemoglobin (are anemic), your body cannot get enough oxygen and your organs may not work properly. As a result, you may feel very tired or have other problems. What are the causes? Common causes of anemia include:  Excessive bleeding. Anemia can be caused by excessive bleeding inside or outside the body, including bleeding from the intestines or from heavy menstrual periods in females.  Poor nutrition.  Long-lasting (chronic) kidney, thyroid, and liver disease.  Bone marrow disorders, spleen problems, and blood disorders.  Cancer and treatments for cancer.  HIV (human immunodeficiency virus) and AIDS (acquired immunodeficiency syndrome).  Infections, medicines, and autoimmune disorders that destroy red blood cells. What are the signs or symptoms? Symptoms of this condition include:  Minor weakness.  Dizziness.  Headache, or difficulties concentrating and sleeping.  Heartbeats that feel irregular or faster than normal (palpitations).  Shortness of breath, especially with exercise.  Pale skin, lips, and nails, or cold hands and feet.  Indigestion and nausea. Symptoms may occur suddenly or develop slowly. If your anemia is mild, you may not have symptoms. How is this diagnosed? This condition is diagnosed based on blood tests, your medical history, and a physical exam. In some cases, a test may be needed in which cells are removed from the soft tissue inside of a bone and looked at under a microscope (bone marrow biopsy). Your health care provider may also check your stool (feces) for blood and may do additional testing to look for the cause of your bleeding. Other tests may  include:  Imaging tests, such as a CT scan or MRI.  A procedure to see inside your esophagus and stomach (endoscopy).  A procedure to see inside your colon and rectum (colonoscopy). How is this treated? Treatment for this condition depends on the cause. If you continue to lose a lot of blood, you may need to be treated at a hospital. Treatment may include:  Taking supplements of iron, vitamin Q68, or folic acid.  Taking a hormone medicine (erythropoietin) that can help to stimulate red blood cell growth.  Having a blood transfusion. This may be needed if you lose a lot of blood.  Making changes to your diet.  Having surgery to remove your spleen. Follow these instructions at home:  Take over-the-counter and prescription medicines only as told by your health care provider.  Take supplements only as told by your health care provider.  Follow any diet instructions that you were given by your health care provider.  Keep all follow-up visits as told by your health care provider. This is important. Contact a health care provider if:  You develop new bleeding anywhere in the body. Get help right away if:  You are very weak.  You are short of breath.  You have pain in your abdomen or chest.  You are dizzy or feel faint.  You have trouble concentrating.  You have bloody stools, black stools, or tarry stools.  You vomit repeatedly or you vomit up blood. These symptoms may represent a serious problem that is an emergency. Do not wait to see if the symptoms will go away. Get medical help right away. Call your local emergency services (911 in the [place] Do not  drive yourself to the hospital. Summary  Anemia is a condition in which you do not have enough red blood cells or enough of a substance in your red blood cells that carries oxygen (hemoglobin).  Symptoms may occur suddenly or develop slowly.  If your anemia is mild, you may not have symptoms.  This condition is  diagnosed with blood tests, a medical history, and a physical exam. Other tests may be needed.  Treatment for this condition depends on the cause of the anemia. This information is not intended to replace advice given to you by your health care provider. Make sure you discuss any questions you have with your health care provider. Document Revised: 06/23/2019 Document Reviewed: 06/23/2019 Elsevier Patient Education  2021 Elsevier Inc.  

## 2020-09-28 NOTE — Progress Notes (Signed)
Subjective:  Patient ID: [name], female    DOB: [birth year]  Age: 80 y.o. MRN: [id number]  CC: Anemia  This visit occurred during the SARS-CoV-2 public health emergency.  Safety protocols were in place, including screening questions prior to the visit, additional usage of staff PPE, and extensive cleaning of exam room while observing appropriate contact time as indicated for disinfecting solutions.    HPI [name] presents for f/up -  She was admitted a little more than a month ago for worsening abdominal pain and was found to have an infected, incarcerated ventral abdominal hernia and COVID-19 infection.  She had successful surgical intervention but was left with a colostomy.  She says her abdominal she says her abdominal pain is much better.  She has been treated elsewhere for a UTI but continues to c/o dysuria and frequency.  Admit date: 08/09/2020 Discharge date: 08/19/2020  Admitted From: home Disposition:  SNF  Recommendations for Outpatient Follow-up:  1. Follow up with surgery as scheduled 2. Continue Augmentin for 4 additional days to complete total of 10-day course following surgery 3. Please obtain BMP/CBC in one week 4. Please follow up on the following pending results:  Home Health: none Equipment/Devices: none  Discharge Condition: stable CODE STATUS: Full code Diet recommendation: regular  HPI: Per admitting MD, [name] Lemonsis a 80 y.o.femalewith medical history significant forHTN, COPD, CKD 3, diastolic CHF morbid obesity who presents with complaint of abdominal pain for the past 3 weeks. She has a chronic ventral hernia which has grown in size the past few weeks and has become more painful. She reports severe pain with even light touch of the hernia. She has hadnausea and vomiting as well as subjective fever and chills the last few weeks. She had some diarrhea 4 days ago which is now resolved and she has not had any bowel movement in the  last few days. She reports the pain has been fairly constant for the last week and has not had any alleviating factors. She denies any shortness of breath. She does have a chronic nonproductive cough which is unchanged. She has had decreased appetite the last week with the pain and nausea.   Hospital Course / Discharge diagnoses: Principal Problem Incarcerated ventral hernia, mesenteric mass, suspected colonic / appendiceal perforation -Initially IR consulted and had to placement on 1/11, eventually had operative management on 1/14 by general surgery with mass resection, colectomy/colostomy.  She recovered well postoperatively, her diet was advanced and she is now tolerating a regular diet.  Initial cultures from IR showed pansensitive E. coli.  She was maintained on Unasyn postoperatively and will be transitioned to Augmentin on discharge for total of 10 day course following the surgery.  She will have outpatient follow-up with surgery.  Active Problems COVID-19 pneumonia, breakthrough case -Subtle infiltrates on chest x-ray. Completed a course of remdesivir. Respiratory status currently appears stable and at baseline. Chronic hypoxic respiratory failure, COPD -Chronically on a couple liters of oxygen at home, continue supplemental oxygen, at baseline, no wheezing Chronic diastolic CHF -2D echo showed grade 2 diastolic dysfunction, preserved LVEF.  Continue home medications on discharge Anemia of critical illness -no bleeding, hemoglobin stable.  Transfused prior to discharge. Acute kidney injury on CKD 3B -Baseline creatinine about 1.5, currently close to baseline Left renal mass -This will be addressed by urology as an outpatient Hyperlipidemia -Continue statin Essential hypertension -Continue home regimen  Obesity -She would benefit from weight loss   Outpatient Medications Prior  to Visit  Medication Sig Dispense Refill  . acetaminophen (TYLENOL) 325 MG tablet Take 325-650 mg by  mouth daily as needed for moderate pain, fever or headache.     . albuterol (PROAIR HFA) 108 (90 Base) MCG/ACT inhaler Inhale 1-2 puffs into the lungs every 6 (six) hours as needed for wheezing or shortness of breath. 18 g 11  . allopurinol (ZYLOPRIM) 100 MG tablet Take 1 tablet (100 mg total) by mouth daily. 90 tablet 1  . ferrous sulfate 325 (65 FE) MG tablet Take 1 tablet (325 mg total) by mouth 2 (two) times daily with a meal. 180 tablet 1  . fluticasone furoate-vilanterol (BREO ELLIPTA) 100-25 MCG/INH AEPB INHALE 1 PUFF INTO THE LUNGS DAILY 120 each 1  . isosorbide-hydrALAZINE (BIDIL) 20-37.5 MG tablet Take 1 tablet by mouth 3 (three) times daily. (Patient taking differently: Take 1 tablet by mouth 2 (two) times daily.) 270 tablet 1  . metoprolol tartrate (LOPRESSOR) 25 MG tablet Take 1 tablet (25 mg total) by mouth 2 (two) times daily. (Patient taking differently: Take 12.5 mg by mouth 2 (two) times daily.) 180 tablet 1  . torsemide (DEMADEX) 20 MG tablet TAKE 1 TABLET(20 MG) BY MOUTH TWICE DAILY (Patient taking differently: Take 20 mg by mouth 2 (two) times daily.) 180 tablet 0  . Pitavastatin Magnesium (ZYPITAMAG) 2 MG TABS Take 1 tablet by mouth daily. 90 tablet 1  . saccharomyces boulardii (FLORASTOR) 250 MG capsule Take 1 capsule (250 mg total) by mouth 2 (two) times daily.     No facility-administered medications prior to visit.    ROS Review of Systems  Constitutional: Positive for fatigue. Negative for appetite change, chills, diaphoresis and unexpected weight change.  HENT: Negative.  Negative for trouble swallowing.   Eyes: Negative.   Respiratory: Negative for cough, chest tightness, shortness of breath and wheezing.   Cardiovascular: Negative for chest pain, palpitations and leg swelling.  Gastrointestinal: Positive for abdominal pain. Negative for abdominal distention, constipation, diarrhea and nausea.  Endocrine: Negative.   Genitourinary: Positive for dysuria. Negative  for decreased urine volume, difficulty urinating, flank pain, frequency, hematuria and urgency.  Musculoskeletal: Negative.   Skin: Negative.   Neurological: Negative.  Negative for dizziness, weakness, light-headedness and headaches.  Hematological: Negative for adenopathy. Does not bruise/bleed easily.  Psychiatric/Behavioral: Negative.     Objective:  BP (!) 142/86   Pulse 65   Temp 98 F (36.7 C) (Oral)   Ht 5' (1.524 m)   Wt 181 lb (82.1 kg)   SpO2 92%   BMI 35.35 kg/m   BP Readings from Last 3 Encounters:  09/28/20 (!) 142/86  08/19/20 (!) 160/60  08/03/20 (!) 166/62    Wt Readings from Last 3 Encounters:  09/28/20 181 lb (82.1 kg)  08/09/20 189 lb 13.1 oz (86.1 kg)  08/03/20 183 lb 6.4 oz (83.2 kg)    Physical Exam Vitals reviewed.  Constitutional:      General: She is not in acute distress.    Appearance: She is not toxic-appearing or diaphoretic.  HENT:     Nose: Nose normal.     Mouth/Throat:     Mouth: Mucous membranes are moist.  Eyes:     General: No scleral icterus.    Conjunctiva/sclera: Conjunctivae normal.  Cardiovascular:     Rate and Rhythm: Normal rate and regular rhythm.     Heart sounds: Murmur heard.   Systolic murmur is present with a grade of 3/6. No gallop.   Pulmonary:  Effort: Pulmonary effort is normal.     Breath sounds: No stridor. No wheezing, rhonchi or rales.  Abdominal:     General: Abdomen is protuberant. Bowel sounds are normal. There is no distension.     Palpations: Abdomen is soft. There is no hepatomegaly, splenomegaly or mass.     Tenderness: There is no abdominal tenderness.    Musculoskeletal:        General: Normal range of motion.     Cervical back: Neck supple.     Right lower leg: No edema.     Left lower leg: No edema.  Lymphadenopathy:     Cervical: No cervical adenopathy.  Skin:    General: Skin is warm.     Coloration: Skin is pale.  Neurological:     General: No focal deficit present.      Mental Status: She is alert.     Lab Results  Component Value Date   WBC 8.1 09/28/2020   HGB 8.9 (L) 09/28/2020   HCT 26.7 Repeated and verified X2. (L) 09/28/2020   PLT 238.0 09/28/2020   GLUCOSE 98 09/28/2020   CHOL 165 02/04/2020   TRIG 162 (H) 02/04/2020   HDL 49 (L) 02/04/2020   LDLDIRECT 107.0 09/04/2016   LDLCALC 90 02/04/2020   ALT 11 08/13/2020   AST 14 (L) 08/13/2020   NA 136 09/28/2020   K 4.6 09/28/2020   CL 101 09/28/2020   CREATININE 1.34 (H) 09/28/2020   BUN 31 (H) 09/28/2020   CO2 31 09/28/2020   TSH 2.35 07/02/2019   INR 1.2 08/13/2020   HGBA1C 5.4 11/05/2017   MICROALBUR <0.7 11/05/2017    CT ABDOMEN PELVIS W CONTRAST  Result Date: 08/09/2020 CLINICAL DATA:  Persistent abdominal pain from known hernia x3 weeks. EXAM: CT ABDOMEN AND PELVIS WITH CONTRAST TECHNIQUE: Multidetector CT imaging of the abdomen and pelvis was performed using the standard protocol following bolus administration of intravenous contrast. CONTRAST:  52m OMNIPAQUE IOHEXOL 300 MG/ML  SOLN COMPARISON:  February 08, 2020 FINDINGS: Lower chest: There is atelectasis at the lung bases. There is a fat containing [place] hernia on the right.The heart size is normal. Hepatobiliary: The liver is normal. Normal gallbladder.There is no biliary ductal dilation. Pancreas: Normal contours without ductal dilatation. No peripancreatic fluid collection. Spleen: There is splenomegaly with the spleen measuring approximately 13 cm craniocaudad. Adrenals/Urinary Tract: --Adrenal glands: Unremarkable. --Right kidney/ureter: There is a solid exophytic mass arising from the interpolar region of the right kidney measuring 3.5 cm (axial series 2, image 30). Multiple cysts are noted throughout the right kidney. There is no right-sided hydronephrosis. --Left kidney/ureter: Multiple cysts are noted. --Urinary bladder: Unremarkable. Stomach/Bowel: --Stomach/Duodenum: No hiatal hernia or other gastric abnormality. Normal  duodenal course and caliber. --Small bowel: Unremarkable. --Colon: There is a moderate amount of stool at the level of the rectum. There is scattered colonic diverticula without CT evidence for diverticulitis. Again noted is a large ventral wall hernia containing a loop of the transverse colon in addition to portions of the patient's appendix. There is now a large air and fluid collection in the hernia sac measuring approximately 9.4 x 7.2 cm. There is significant adjacent fat stranding and overlying skin thickening. --Appendix: The visualized portions of the intraperitoneal appendix are unremarkable. The portions that are herniated are difficult to evaluate. Vascular/Lymphatic: Atherosclerotic calcification is present within the non-aneurysmal abdominal aorta, without hemodynamically significant stenosis. --there are few prominent, nonspecific retroperitoneal lymph nodes. --No mesenteric lymphadenopathy. --No pelvic or inguinal lymphadenopathy.  Reproductive: Unremarkable Other: Again noted is a large ventral wall hernia containing portions of the transverse colon as and a large volume of peritoneal fat. Musculoskeletal. There is diffuse osteopenia which limits detection of nondisplaced fractures. There is no definite acute displaced fracture identified on today's study. IMPRESSION: 1. Large ventral wall hernia containing a loop of the transverse colon. While there is no evidence for an obstruction, there is a new large air and fluid collection within the hernia sac concerning for perforation of the transverse colon with resultant abscess formation or complicated diverticulitis. Alternatively, a portion of the appendix is located within the hernia sac. As such, the differential includes complicated appendicitis, although this is felt to be less likely. 2. There is a 3.5 cm right renal mass consistent with renal cell carcinoma until proven otherwise. 3. Splenomegaly. 4. Additional chronic findings as detailed above.  Aortic Atherosclerosis (ICD10-I70.0). These results were called by telephone at the time of interpretation on 08/09/2020 at 2:57 am to provider DAN FLOYD , who verbally acknowledged these results. Electronically Signed   By: Constance Holster M.D.   On: 08/09/2020 03:00   DG CHEST PORT 1 VIEW  Result Date: 08/09/2020 CLINICAL DATA:  80 year old female with history of COVID infection with increasing shortness of breath. EXAM: PORTABLE CHEST 1 VIEW COMPARISON:  Chest x-ray 01/01/2018. FINDINGS: Ill-defined opacities and areas of mild interstitial prominence are noted throughout the mid to lower lungs bilaterally. Small left pleural effusion or chronic pleuroparenchymal scarring, stable compared to remote prior study from 2019. No pneumothorax. No evidence of pulmonary edema. Heart size is mildly enlarged. Upper mediastinal contours are within normal limits. Aortic atherosclerosis. IMPRESSION: 1. There are subtle findings in the mid to lower lungs which suggest developing multilobar bilateral pneumonia, compatible with reported COVID infection. 2. Mild cardiomegaly. 3. Aortic atherosclerosis. Electronically Signed   By: Vinnie Langton M.D.   On: 08/09/2020 09:14   [place] IMAGE GUIDED FLUID DRAIN BY CATHETER  Result Date: 08/09/2020 INDICATION: 80 year old female with a history incarcerated ventral hernia containing colon and appendix, now with abscess, presumably from appendicitis EXAM: ULTRASOUND-GUIDED DRAINAGE OF ABDOMINAL WALL ABSCESS MEDICATIONS: The patient is currently admitted to the hospital and receiving intravenous antibiotics. The antibiotics were administered within an appropriate time frame prior to the initiation of the procedure. ANESTHESIA/SEDATION: Fentanyl 100 mcg IV; Versed 1.0 mg IV Moderate Sedation Time:  12 minutes The patient was continuously monitored during the procedure by the interventional radiology nurse under my direct supervision. COMPLICATIONS: None PROCEDURE: Informed written  consent was obtained from the patient after a thorough discussion of the procedural risks, benefits and alternatives. All questions were addressed. Maximal Sterile Barrier Technique was utilized including caps, mask, sterile gowns, sterile gloves, sterile drape, hand hygiene and skin antiseptic. A timeout was performed prior to the initiation of the procedure. Patient positioned supine position on the ultrasound gantry. Scout images were acquired for planning purposes. The patient is prepped and draped in the usual sterile fashion. 1% lidocaine was used for local anesthesia. Once the patient was anesthetized, small stab incision was made with 11 blade scalpel. Trocar technique was used to place a 12 [place] drain into the abscess with complex fluid in the ventral wall. The 12 French catheter was advanced from the trocar and formed in the abscess. Approximately 150 cc of foul-smelling dark fluid aspirated. Culture was sent. Catheter was sutured in position and a final image was stored. Catheter was attached to bulb suction. Patient tolerated the procedure well and remained  hemodynamically stable throughout. No complications were encountered and no significant blood loss. IMPRESSION: Status post ultrasound-guided drainage of ventral wall abscess with sample sent for culture. Signed, Dulcy Fanny. Dellia Nims, RPVI [facility] Electronically Signed   By: Corrie Mckusick D.O.   On: 08/09/2020 16:27    Assessment & Plan:   Becca was seen today for anemia.  Diagnoses and all orders for this visit:  Left sided ulcerative (chronic) colitis ([place]) -     saccharomyces boulardii (FLORASTOR) 250 MG capsule; Take 1 capsule (250 mg total) by mouth 2 (two) times daily.  Hyperlipidemia with target LDL less than 100- She has achieved her LDL goal and is doing well on the statin. -     Pitavastatin Magnesium (ZYPITAMAG) 2 MG TABS; Take 1 tablet by mouth daily.  Chronic  kidney disease, stage 3b ([place])- Her renal function is stable. -     Basic metabolic panel; Future -     Basic metabolic panel  Vitamin C12 deficiency anemia due to intrinsic factor deficiency- Her H/H and B12 are low. I have asked her to restart parenteral replacement therapy. -     CBC with Differential/Platelet; Future -     Vitamin B12; Future -     Folate; Future -     Folate -     Vitamin B12 -     CBC with Differential/Platelet  Symptomatic anemia- Will screen her for vit deficiencies. -     CBC with Differential/Platelet; Future -     Vitamin B12; Future -     Iron; Future -     Ferritin; Future -     Vitamin B1; Future -     Vitamin B1 -     Ferritin -     Iron -     Vitamin B12 -     CBC with Differential/Platelet  Dysuria- Will check a UA and clx.  Will treat if indicated. -     CULTURE, URINE COMPREHENSIVE; Future -     Urinalysis, Routine w reflex microscopic; Future -     Urinalysis, Routine w reflex microscopic -     CULTURE, URINE COMPREHENSIVE   I am having [name] maintain her albuterol, acetaminophen, ferrous sulfate, BiDil, Breo Ellipta, metoprolol tartrate, allopurinol, torsemide, Zypitamag, and saccharomyces boulardii.  Meds ordered this encounter  Medications  . Pitavastatin Magnesium (ZYPITAMAG) 2 MG TABS    Sig: Take 1 tablet by mouth daily.    Dispense:  90 tablet    Refill:  1  . saccharomyces boulardii (FLORASTOR) 250 MG capsule    Sig: Take 1 capsule (250 mg total) by mouth 2 (two) times daily.    Dispense:  180 capsule    Refill:  1   I spent 48 minutes in preparing to see the patient by review of recent labs, imaging and procedures, obtaining and reviewing separately obtained history, communicating with the patient and family or caregiver, ordering medications, tests or procedures, and documenting clinical information in the EHR including the differential Dx, treatment, and any further evaluation and other management of 1.  Hyperlipidemia with target LDL less than 100 2. Left sided ulcerative (chronic) colitis ([place]) 3. Chronic kidney disease, stage 3b ([place]) 4. Vitamin B12 deficiency anemia due to intrinsic factor deficiency 5. Symptomatic anemia 6. Dysuria     Follow-up: Return in about 3 months (around 12/29/2020).  [name], MD

## 2020-09-29 LAB — FOLATE: Folate: 6.2 ng/mL (ref >5.9)

## 2020-09-29 LAB — FERRITIN: Ferritin: 77.9 ng/mL (ref 10.0–291.0)

## 2020-09-29 LAB — VITAMIN B12: Vitamin B-12: 197 pg/mL — ABNORMAL LOW (ref 211–911)

## 2020-09-29 NOTE — Addendum Note (Signed)
Addended by: [name] on: 09/29/2020 11:08 AM   Modules accepted: Orders

## 2020-09-30 LAB — CULTURE, URINE COMPREHENSIVE

## 2020-10-03 LAB — VITAMIN B1: Vitamin B1 (Thiamine): 15 nmol/L (ref 8–30)

## 2020-10-04 ENCOUNTER — Telehealth: Payer: Self-pay

## 2020-10-04 NOTE — Telephone Encounter (Signed)
Verbal order requested for B12 injections frequency.   Last OV & lab: 09/28/20

## 2020-10-04 NOTE — Telephone Encounter (Signed)
Weekly for 3 weeks then monthly thereafter

## 2020-10-05 ENCOUNTER — Other Ambulatory Visit: Payer: Self-pay

## 2020-10-05 ENCOUNTER — Ambulatory Visit (INDEPENDENT_AMBULATORY_CARE_PROVIDER_SITE_OTHER): Payer: Medicare Other

## 2020-10-05 DIAGNOSIS — E538 Deficiency of other specified B group vitamins: Secondary | ICD-10-CM | POA: Diagnosis not present

## 2020-10-05 MED ORDER — CYANOCOBALAMIN 1000 MCG/ML IJ SOLN
1000.0000 ug | Freq: Once | INTRAMUSCULAR | Status: AC
  Administered 2020-10-05: 1000 ug via INTRAMUSCULAR

## 2020-10-05 NOTE — Progress Notes (Signed)
B12 given

## 2020-10-05 NOTE — Telephone Encounter (Signed)
Pt notified of frequency of b12 injections & verb understanding.  Appt made for B12 on 10/13/20.

## 2020-10-13 ENCOUNTER — Ambulatory Visit (INDEPENDENT_AMBULATORY_CARE_PROVIDER_SITE_OTHER): Payer: Medicare Other | Admitting: General Practice

## 2020-10-13 ENCOUNTER — Other Ambulatory Visit: Payer: Self-pay

## 2020-10-13 DIAGNOSIS — E538 Deficiency of other specified B group vitamins: Secondary | ICD-10-CM | POA: Diagnosis not present

## 2020-10-13 MED ORDER — CYANOCOBALAMIN 1000 MCG/ML IJ SOLN
1000.0000 ug | Freq: Once | INTRAMUSCULAR | Status: AC
Start: 2020-10-13 — End: 2020-10-13
  Administered 2020-10-13: 1000 ug via INTRAMUSCULAR

## 2020-10-13 NOTE — Progress Notes (Signed)
I have reviewed and agree.

## 2020-10-20 ENCOUNTER — Ambulatory Visit (INDEPENDENT_AMBULATORY_CARE_PROVIDER_SITE_OTHER): Payer: Medicare Other

## 2020-10-20 ENCOUNTER — Other Ambulatory Visit: Payer: Self-pay

## 2020-10-20 DIAGNOSIS — D510 Vitamin B12 deficiency anemia due to intrinsic factor deficiency: Secondary | ICD-10-CM

## 2020-10-20 MED ORDER — CYANOCOBALAMIN 1000 MCG/ML IJ SOLN
1000.0000 ug | Freq: Once | INTRAMUSCULAR | Status: AC
Start: 2020-10-20 — End: 2020-10-20
  Administered 2020-10-20: 1000 ug via INTRAMUSCULAR

## 2020-10-20 NOTE — Progress Notes (Addendum)
Patient here for weekly B12 injection per Dr. Ronnald Ramp. B12 1000 mcg given in left IM and patient tolerated injection well today.  I have reviewed and agree

## 2020-10-22 DIAGNOSIS — L03119 Cellulitis of unspecified part of limb: Secondary | ICD-10-CM | POA: Diagnosis not present

## 2020-10-22 DIAGNOSIS — J449 Chronic obstructive pulmonary disease, unspecified: Secondary | ICD-10-CM | POA: Diagnosis not present

## 2020-10-29 DIAGNOSIS — Z933 Colostomy status: Secondary | ICD-10-CM | POA: Diagnosis not present

## 2020-11-01 DIAGNOSIS — N281 Cyst of kidney, acquired: Secondary | ICD-10-CM | POA: Diagnosis not present

## 2020-11-01 DIAGNOSIS — N183 Chronic kidney disease, stage 3 unspecified: Secondary | ICD-10-CM | POA: Diagnosis not present

## 2020-11-01 DIAGNOSIS — C641 Malignant neoplasm of right kidney, except renal pelvis: Secondary | ICD-10-CM | POA: Diagnosis not present

## 2020-11-02 ENCOUNTER — Other Ambulatory Visit: Payer: Self-pay | Admitting: Internal Medicine

## 2020-11-02 DIAGNOSIS — I5042 Chronic combined systolic (congestive) and diastolic (congestive) heart failure: Secondary | ICD-10-CM

## 2020-11-02 DIAGNOSIS — I10 Essential (primary) hypertension: Secondary | ICD-10-CM

## 2020-11-22 ENCOUNTER — Other Ambulatory Visit: Payer: Self-pay

## 2020-11-22 ENCOUNTER — Ambulatory Visit (INDEPENDENT_AMBULATORY_CARE_PROVIDER_SITE_OTHER): Payer: Medicare Other | Admitting: Pharmacist

## 2020-11-22 DIAGNOSIS — I480 Paroxysmal atrial fibrillation: Secondary | ICD-10-CM | POA: Diagnosis not present

## 2020-11-22 DIAGNOSIS — E785 Hyperlipidemia, unspecified: Secondary | ICD-10-CM

## 2020-11-22 DIAGNOSIS — I10 Essential (primary) hypertension: Secondary | ICD-10-CM

## 2020-11-22 DIAGNOSIS — J9611 Chronic respiratory failure with hypoxia: Secondary | ICD-10-CM

## 2020-11-22 DIAGNOSIS — M1030 Gout due to renal impairment, unspecified site: Secondary | ICD-10-CM | POA: Diagnosis not present

## 2020-11-22 DIAGNOSIS — N1832 Chronic kidney disease, stage 3b: Secondary | ICD-10-CM

## 2020-11-22 DIAGNOSIS — I5042 Chronic combined systolic (congestive) and diastolic (congestive) heart failure: Secondary | ICD-10-CM | POA: Diagnosis not present

## 2020-11-22 NOTE — Progress Notes (Signed)
Chronic Care Management Pharmacy Note  11/22/2020 Name:  [name] MRN:  [id number] DOB:  17-Jul-1941  Subjective: [name] is an 80 y.o. year old female who is a primary patient of [name], MD.  The CCM team was consulted for assistance with disease management and care coordination needs.    Engaged with patient by telephone for follow up visit in response to provider referral for pharmacy case management and/or care coordination services.   Consent to Services:  The patient was given information about Chronic Care Management services, agreed to services, and gave verbal consent prior to initiation of services.  Please see initial visit note for detailed documentation.   Patient Care Team: [name], MD as PCP - General [place] Lack Charlann Lange, MD as PCP - Cardiology (Cardiology) Thompson Grayer, MD as PCP - Electrophysiology (Cardiology) Charlton Haws, [facility] as Pharmacist (Pharmacist)  Recent office visits: 09/28/20 Dr Ronnald Ramp OV: hospital f/u; s/p admission for abdominal pain and COVID. DC'd livalo (pt not taking). Labs stable  Recent consult visits: [facility] visits: 08/09/20-08/19/20 admission for ventral hernia. Operated 1/14 with mass resection, colectomy/colostomy. COVID positive inpatient. Started Augmentin for UTI and Probiotic at discharge.  Medication Reconciliation was completed by comparing discharge summary, patient's EMR and Pharmacy list, and upon discussion with patient.  Admitted to the hospital on 08/09/20 due to ventral hernia. Discharge date was 08/19/20. Discharged from [place]?Medications Started at [facility] Discharge:?? -started Augmentin and Florastor due to UTI  Medication Changes at Hospital Discharge: -none  Medications Discontinued at Hospital Discharge: -none  Medications that remain the same after Hospital Discharge:??  -All other medications will remain the same.    Objective:  Lab Results   Component Value Date   CREATININE 1.34 (H) 09/28/2020   BUN 31 (H) 09/28/2020   GFR 37.53 (L) 09/28/2020   GFRNONAA 39 (L) 08/19/2020   GFRAA 30 (L) 02/19/2020   NA 136 09/28/2020   K 4.6 09/28/2020   CALCIUM 9.8 09/28/2020   CO2 31 09/28/2020   GLUCOSE 98 09/28/2020    Lab Results  Component Value Date/Time   HGBA1C 5.4 11/05/2017 11:03 AM   HGBA1C 5.7 08/19/2017 03:01 PM   GFR 37.53 (L) 09/28/2020 03:50 PM   GFR 37.48 (L) 11/12/2019 05:02 PM   MICROALBUR <0.7 11/05/2017 11:03 AM   MICROALBUR 15.9 (H) 02/05/2013 01:36 PM    Last diabetic Eye exam:  Lab Results  Component Value Date/Time   HMDIABEYEEXA No Retinopathy 09/23/2018 12:00 AM    Last diabetic Foot exam:  Lab Results  Component Value Date/Time   HMDIABFOOTEX done 02/19/2013 12:00 AM     Lab Results  Component Value Date   CHOL 165 02/04/2020   HDL 49 (L) 02/04/2020   LDLCALC 90 02/04/2020   LDLDIRECT 107.0 09/04/2016   TRIG 162 (H) 02/04/2020   CHOLHDL 3.4 02/04/2020    Hepatic Function Latest Ref Rng & Units 08/15/2020 08/13/2020 08/11/2020  Total Protein 6.5 - 8.1 g/dL - 5.3(L) 5.4(L)  Albumin 3.5 - 5.0 g/dL 2.2(L) 2.0(L) 2.0(L)  AST 15 - 41 U/L - 14(L) 15  ALT 0 - 44 U/L - 11 12  Alk Phosphatase 38 - 126 U/L - 40 46  Total Bilirubin 0.3 - 1.2 mg/dL - 0.5 0.5  Bilirubin, Direct 0.0 - 0.3 mg/dL - - -    Lab Results  Component Value Date/Time   TSH 2.35 07/02/2019 11:31 AM   TSH 3.276  10/17/2017 08:46 PM   TSH 3.22 08/08/2017 04:22 PM   FREET4 1.13 (H) 10/17/2017 08:46 PM    CBC Latest Ref Rng & Units 09/28/2020 08/19/2020 08/18/2020  WBC 4.0 - 10.5 K/uL 8.1 13.1(H) 13.4(H)  Hemoglobin 12.0 - 15.0 g/dL 8.9(L) 7.6(L) 7.8(L)  Hematocrit 36.0 - 46.0 % 26.7 Repeated and verified X2.(L) 24.8(L) 25.1(L)  Platelets 150.0 - 400.0 K/uL 238.0 247 255    Lab Results  Component Value Date/Time   VD25OH 35.09 07/02/2019 11:31 AM   VD25OH 93.56 12/29/2018 01:46 PM   Clinical ASCVD: No  The ASCVD Risk  score Mikey Bussing DC Jr., et al., 2013) failed to calculate for the following reasons:   The 2013 ASCVD risk score is only valid for ages 32 to 75    Depression screen PHQ 2/9 02/04/2020 01/01/2018 07/18/2017  Decreased Interest 0 0 0  Down, Depressed, Hopeless 0 0 0  PHQ - 2 Score 0 0 0  Some recent data might be hidden     CHA2DS2-VASc Score = 5  The patient's score is based upon: CHF History: Yes HTN History: Yes Diabetes History: No Stroke History: No Vascular Disease History: No Age Score: 2 Gender Score: 1      Social History   Tobacco Use  Smoking Status Former Smoker  . Quit date: 06/01/1996  . Years since quitting: 24.4  Smokeless Tobacco Never Used  Tobacco Comment   Quit 1998   BP Readings from Last 3 Encounters:  09/28/20 (!) 142/86  08/19/20 (!) 160/60  08/03/20 (!) 166/62   Pulse Readings from Last 3 Encounters:  09/28/20 65  08/19/20 74  08/03/20 73   Wt Readings from Last 3 Encounters:  09/28/20 181 lb (82.1 kg)  08/09/20 189 lb 13.1 oz (86.1 kg)  08/03/20 183 lb 6.4 oz (83.2 kg)   BMI Readings from Last 3 Encounters:  09/28/20 35.35 kg/m  08/09/20 37.07 kg/m  08/03/20 35.82 kg/m   Assessment/Interventions: Review of patient past medical history, allergies, medications, health status, including review of consultants reports, laboratory and other test data, was performed as part of comprehensive evaluation and provision of chronic care management services.   SDOH:  (Social Determinants of Health) assessments and interventions performed: Yes  SDOH Screenings   Alcohol Screen: Not on file  Depression (PHQ2-9): Low Risk   . PHQ-2 Score: 0  Financial Resource Strain: High Risk  . Difficulty of Paying Living Expenses: Hard  Food Insecurity: Not on file  Housing: Not on file  Physical Activity: Not on file  Social Connections: Not on file  Stress: Not on file  Tobacco Use: Medium Risk  . Smoking Tobacco Use: Former Smoker  . Smokeless Tobacco  Use: Never Used  Transportation Needs: Not on file    CCM Care Plan  Allergies  Allergen Reactions  . Celebrex [Celecoxib]     edema  . Amlodipine Besylate Other (See Comments)    REACTION: edema  . Enalapril Cough  . Lipitor [Atorvastatin]     Muscle aches  . Amoxicillin Diarrhea    DID THE REACTION INVOLVE: Swelling of the face/tongue/throat, SOB, or low BP? N Sudden or severe rash/hives, skin peeling, or the inside of the mouth or nose? N Did it require medical treatment? N When did it last happen?MORE THAN 10 YEARS If all above answers are "NO", may proceed with cephalosporin use.   . Codeine Sulfate Nausea Only  . Hydrocodone-Acetaminophen Nausea Only    Medications Reviewed Today    Reviewed  by [name], MD (Physician) on 09/28/20 at 1545  Med List Status: <None>  Medication Order Taking? Sig Documenting Provider Last Dose Status Informant  acetaminophen (TYLENOL) 325 MG tablet [id number] Yes Take 325-650 mg by mouth daily as needed for moderate pain, fever or headache.  [provider] Taking Active Self  albuterol (PROAIR HFA) 108 (90 Base) MCG/ACT inhaler [id number] Yes Inhale 1-2 puffs into the lungs every 6 (six) hours as needed for wheezing or shortness of breath. [name], MD Taking Active Self  allopurinol (ZYLOPRIM) 100 MG tablet [id number] Yes Take 1 tablet (100 mg total) by mouth daily. [name], MD Taking Active Self  ferrous sulfate 325 (65 FE) MG tablet [id number] Yes Take 1 tablet (325 mg total) by mouth 2 (two) times daily with a meal. [name], MD Taking Active Self  fluticasone furoate-vilanterol (BREO ELLIPTA) 100-25 MCG/INH AEPB [id number] Yes INHALE 1 PUFF INTO THE LUNGS DAILY [name], MD Taking Active Self  isosorbide-hydrALAZINE (BIDIL) 20-37.5 MG tablet [id number] Yes Take 1 tablet by mouth 3 (three) times daily.  Patient taking differently: Take 1 tablet by mouth 2 (two) times daily.   [name], MD  Taking Active Self           Med Note Luna Glasgow Jun 27, 2020  3:00 PM) Takes BID due to cost  metoprolol tartrate (LOPRESSOR) 25 MG tablet [id number] Yes Take 1 tablet (25 mg total) by mouth 2 (two) times daily.  Patient taking differently: Take 12.5 mg by mouth 2 (two) times daily.   [name], MD Taking Active Self  Pitavastatin Magnesium (ZYPITAMAG) 2 MG TABS [id number]  Take 1 tablet by mouth daily. [name], MD  Active   saccharomyces boulardii (FLORASTOR) 250 MG capsule [id number]  Take 1 capsule (250 mg total) by mouth 2 (two) times daily. [name], MD  Active   torsemide (DEMADEX) 20 MG tablet [id number] Yes TAKE 1 TABLET(20 MG) BY MOUTH TWICE DAILY  Patient taking differently: Take 20 mg by mouth 2 (two) times daily.   [name], MD Taking Active Self          Patient Active Problem List   Diagnosis Date Noted  . Dysuria 09/28/2020  . Shock circulatory ([place])   . Irreducible ventral hernia 08/09/2020  . Infection as cause of abscess of colon 08/09/2020  . COPD (chronic obstructive pulmonary disease) ([place]) 08/09/2020  . Chronic respiratory failure with hypoxia ([place]) 02/06/2020  . Routine general medical examination at a health care facility 02/06/2020  . AVM (arteriovenous malformation) of small bowel, acquired with hemorrhage 08/14/2018  . Hernia, ventral 08/12/2018  . Multiple lung nodules on CT 05/05/2018  . Stenosis of cervical spine with myelopathy ([place]) 01/01/2018  . Atrial fibrillation ([place]) 10/17/2017  . CHF (congestive heart failure), NYHA class III, chronic, diastolic ([place]) 78/67/6720  . Symptomatic anemia 08/10/2017  . Angiodysplasia of cecum   . Angiodysplasia of duodenum   . Grade III diastolic dysfunction 94/70/9628  . MGUS (monoclonal gammopathy of unknown significance) 11/08/2016  . Asteatotic eczema 02/25/2016  . COLD (chronic obstructive lung disease) ([place])   . Anemia, iron deficiency 11/16/2014  . Cor pulmonale  ([place]) 11/16/2014  . OSA (obstructive sleep apnea) 06/22/2013  . Chronic venous stasis dermatitis of both lower extremities 06/02/2013  . Vitamin D deficiency 02/06/2013  . Osteopenia, senile 02/05/2013  . Other screening mammogram 02/05/2013  . Chronic kidney disease, stage  3b ([place]) 06/20/2010  . Morbid obesity ([place]) 04/19/2010  . Gout 01/10/2009  . ULCERATIVE COLITIS-LEFT SIDE 03/19/2008  . Vitamin B12 deficiency anemia 11/13/2006  . Hyperlipidemia with target LDL less than 100 11/27/1996  . GERD 07/30/1992  . HTN (hypertension) 07/30/1988    Immunization History  Administered Date(s) Administered  . Fluad Quad(high Dose 65+) 05/23/2020  . Influenza Split 04/13/2014, 06/05/2018  . Influenza Whole 05/30/2000, 05/12/2009, 04/29/2010  . Influenza, High Dose Seasonal PF 03/29/2016, 04/29/2017, 05/19/2019  . Influenza,inj,Quad PF,6+ Mos 06/02/2013, 04/22/2014, 04/18/2015  . PFIZER(Purple Top)SARS-COV-2 Vaccination 12/19/2019, 01/09/2020  . Pneumococcal Conjugate-13 02/05/2013  . Pneumococcal Polysaccharide-23 11/07/2006, 10/19/2017  . Td 10/30/2001  . Tdap 09/25/2011    Conditions to be addressed/monitored:  Hypertension, Hyperlipidemia, Atrial Fibrillation, Heart Failure, COPD and Chronic Kidney Disease  Care Plan : CCM Pharmacy Care Plan  Updates made by Charlton Haws, RPH since 11/22/2020 12:00 AM    Problem: Hypertension, Hyperlipidemia, Atrial Fibrillation, Heart Failure, COPD and Chronic Kidney Disease   Priority: High    Long-Range Goal: Disease management   Start Date: 11/22/2020  Expected End Date: 11/22/2021  This Visit's Progress: On track  Priority: High  Note:   Current Barriers:  . Unable to independently monitor therapeutic efficacy  Pharmacist Clinical Goal(s):  Marland Kitchen Patient will achieve adherence to monitoring guidelines and medication adherence to achieve therapeutic efficacy through collaboration with PharmD and provider.   Interventions: . 1:1  collaboration with [name], MD regarding development and update of comprehensive plan of care as evidenced by provider attestation and co-signature . Inter-disciplinary care team collaboration (see longitudinal plan of care) . Comprehensive medication review performed; medication list updated in electronic medical record  Heart Failure / Hypertension / CKD 3b    Type: Diastolic Last ejection fraction: 60-65% (05/11/2020) NYHA Class: III (marked limitation of activity)   BP goal is:  <140/90 Patient checks BP at home 1-2x per week   Patient home BP readings are ranging: not checking; wrist cuff inaccurate    Patient has failed these meds in past: n/a Patient is currently controlled on the following medications:   Metoprolol tartrate 25 mg - 1/2 tab BID  Torsemide 20 mg - 2 in morning  Isosorbide-hydralazine 20-37.5 mg BID   We discussed: BP goals; benefits of medications;  -pt is taking lower doses than prescribed of metoprolol - she has been on 25 mg BID previously and had issues with bradycardia so her cardiologist decreased it to 12.5 mg BID. Right now BP appears stable, she does not have a working BP monitor at home to check   Plan: Recommend to continue current medication   AFIB    Hx recurrent GI bleeds - no anticoagulation Patient is currently rate controlled.   Patient has failed these meds in past: n/a Patient is currently controlled on the following medications:   Metoprolol tartrate 25 mg 1/2 tab BID   We discussed: pt denies issues with Afib recently; she reports HR is well controlled on current dose of metoprolol (previously she had bradycardia with 25 mg BID); she is also not a candidate for anticoagulation due to hx of recurrent GI bleeding    Plan: Continue current medications   Hyperlipidemia    LDL goal < 100  Patient has failed these meds in past: atorvastatin (cramps), pitavastatin (cost) Patient is currently uncontrolled on the following  medications:   No medication   We discussed:  diet and exercise extensively; Cholesterol goals; benefits of statin for  ASCVD risk reduction; pt is >78 years old and LDL is 90, reasonable to continue without medication   Plan: Continue control with diet/exercise   COPD    Last spirometry score: not on file Patient has failed these meds in past: n/a Patient is currently controlled on the following medications:   Breo Ellipta 100-25 mcg/inh 1 puff daily  Albuterol nebulizer  Albuterol HFA prn  2 L O2   Using maintenance inhaler regularly? Yes Frequency of rescue inhaler use:  infrequently   We discussed:  pt reports she has extra supply of Breo right now due to having some left over from her time in hospital and rehab; she will let us know if she cannot afford refills when the time comes   Plan Continue current medications  Consider [employer] PAP for Breo if necessary (OOP $600)   Anemia    Hx recurrent GI bleeds. Has had iron infusions in the past.  Patient has failed these meds in past: n/a Patient is currently controlled on the following medications:   Ferrous sulfate 325 mg BID   We discussed:  Patient is satisfied with current regimen and denies issues   Plan: Continue current medications   Gout    Patient has failed these meds in past: n/a Patient is currently controlled on the following medications:   Allopurinol 100 mg daily   We discussed:  Pt reports last gout flare was several years ago; denies issues with medication   Plan: Continue current medications    Patient Goals/Self-Care Activities . Patient will:  - take medications as prescribed focus on medication adherence by pill box  Follow Up Plan: Telephone follow up appointment with care management team member scheduled for: 6 months      Medication Assistance: None required.  Patient affirms current coverage meets needs.  Patient's preferred pharmacy is:  [id number] [place], [place] - [address] AT [place] [address] Phone: [phone] Fax: [place], [place] [address] Phone: [phone] Fax: [phone]  Uses pill box? Yes Pt endorses 100% compliance  We discussed: Current pharmacy is preferred with insurance plan and patient is satisfied with pharmacy services Patient decided to: Continue current medication management strategy  Care Plan and Follow Up Patient Decision:  Patient agrees to Care Plan and Follow-up.  Plan: Telephone follow up appointment with care management team member scheduled for:  6 months  Charlene Brooke, PharmD, [place], CPP Clinical Pharmacist [facility] at [facility] [phone]

## 2020-11-22 NOTE — Patient Instructions (Signed)
Visit Information  Phone number for Pharmacist: [phone]  Goals Addressed   None    Patient Care Plan: CCM Pharmacy Care Plan    Problem Identified: Hypertension, Hyperlipidemia, Atrial Fibrillation, Heart Failure, COPD and Chronic Kidney Disease   Priority: High    Long-Range Goal: Disease management   Start Date: 11/22/2020  Expected End Date: 11/22/2021  This Visit's Progress: On track  Priority: High  Note:   Current Barriers:  . Unable to independently monitor therapeutic efficacy  Pharmacist Clinical Goal(s):  Marland Kitchen Patient will achieve adherence to monitoring guidelines and medication adherence to achieve therapeutic efficacy through collaboration with PharmD and provider.   Interventions: . 1:1 collaboration with [name], MD regarding development and update of comprehensive plan of care as evidenced by provider attestation and co-signature . Inter-disciplinary care team collaboration (see longitudinal plan of care) . Comprehensive medication review performed; medication list updated in electronic medical record  Heart Failure / Hypertension / CKD 3b    Type: Diastolic Last ejection fraction: 60-65% (05/11/2020) NYHA Class: III (marked limitation of activity)   BP goal is:  <140/90 Patient checks BP at home 1-2x per week   Patient home BP readings are ranging: not checking; wrist cuff inaccurate    Patient has failed these meds in past: n/a Patient is currently controlled on the following medications:   Metoprolol tartrate 25 mg - 1/2 tab BID  Torsemide 20 mg - 2 in morning  Isosorbide-hydralazine 20-37.5 mg BID   We discussed: BP goals; benefits of medications;  -pt is taking lower doses than prescribed of metoprolol - she has been on 25 mg BID previously and had issues with bradycardia so her cardiologist decreased it to 12.5 mg BID. Right now BP appears stable, she does not have a working BP monitor at home to check   Plan: Recommend to continue  current medication   AFIB    Hx recurrent GI bleeds - no anticoagulation Patient is currently rate controlled.   Patient has failed these meds in past: n/a Patient is currently controlled on the following medications:   Metoprolol tartrate 25 mg 1/2 tab BID   We discussed: pt denies issues with Afib recently; she reports HR is well controlled on current dose of metoprolol (previously she had bradycardia with 25 mg BID); she is also not a candidate for anticoagulation due to hx of recurrent GI bleeding    Plan: Continue current medications   Hyperlipidemia    LDL goal < 100  Patient has failed these meds in past: atorvastatin (cramps), pitavastatin (cost) Patient is currently uncontrolled on the following medications:   No medication   We discussed:  diet and exercise extensively; Cholesterol goals; benefits of statin for ASCVD risk reduction; pt is >40 years old and LDL is 90, reasonable to continue without medication   Plan: Continue control with diet/exercise   COPD    Last spirometry score: not on file Patient has failed these meds in past: n/a Patient is currently controlled on the following medications:   Breo Ellipta 100-25 mcg/inh 1 puff daily  Albuterol nebulizer  Albuterol HFA prn  2 L O2   Using maintenance inhaler regularly? Yes Frequency of rescue inhaler use:  infrequently   We discussed:  pt reports she has extra supply of Breo right now due to having some left over from her time in hospital and rehab; she will let us know if she cannot afford refills when the time comes   Plan Continue current  medications  Consider [employer] PAP for Breo if necessary (OOP $600)   Anemia    Hx recurrent GI bleeds. Has had iron infusions in the past.  Patient has failed these meds in past: n/a Patient is currently controlled on the following medications:   Ferrous sulfate 325 mg BID   We discussed:  Patient is satisfied with current regimen and denies issues    Plan: Continue current medications   Gout    Patient has failed these meds in past: n/a Patient is currently controlled on the following medications:   Allopurinol 100 mg daily   We discussed:  Pt reports last gout flare was several years ago; denies issues with medication   Plan: Continue current medications    Patient Goals/Self-Care Activities . Patient will:  - take medications as prescribed focus on medication adherence by pill box  Follow Up Plan: Telephone follow up appointment with care management team member scheduled for: 6 months    Patient verbalizes understanding of instructions provided today and agrees to view in [place].  Telephone follow up appointment with pharmacy team member scheduled for: 6 months  Charlene Brooke, PharmD, [place], CPP Clinical Pharmacist [name] at [facility] [phone]

## 2020-11-24 ENCOUNTER — Other Ambulatory Visit: Payer: Self-pay

## 2020-11-24 ENCOUNTER — Ambulatory Visit (INDEPENDENT_AMBULATORY_CARE_PROVIDER_SITE_OTHER): Payer: Medicare Other

## 2020-11-24 DIAGNOSIS — E538 Deficiency of other specified B group vitamins: Secondary | ICD-10-CM | POA: Diagnosis not present

## 2020-11-24 MED ORDER — CYANOCOBALAMIN 1000 MCG/ML IJ SOLN
1000.0000 ug | INTRAMUSCULAR | Status: DC
  Administered 2020-11-24 – 2021-12-06 (×3): 1000 ug via INTRAMUSCULAR

## 2020-11-24 NOTE — Progress Notes (Signed)
Pt here for monthly B12 injection per Dr. Ronnald Ramp  B12 1060mg given right deltoid IM, and pt tolerated injection well.  Next B12 injection scheduled for 12/23/20.

## 2020-11-29 DIAGNOSIS — Z933 Colostomy status: Secondary | ICD-10-CM | POA: Diagnosis not present

## 2020-12-23 ENCOUNTER — Ambulatory Visit (INDEPENDENT_AMBULATORY_CARE_PROVIDER_SITE_OTHER): Payer: Medicare Other

## 2020-12-23 ENCOUNTER — Other Ambulatory Visit: Payer: Self-pay

## 2020-12-23 DIAGNOSIS — E538 Deficiency of other specified B group vitamins: Secondary | ICD-10-CM | POA: Diagnosis not present

## 2020-12-23 MED ORDER — CYANOCOBALAMIN 1000 MCG/ML IJ SOLN
1000.0000 ug | Freq: Once | INTRAMUSCULAR | Status: AC
  Administered 2020-12-23: 1000 ug via INTRAMUSCULAR

## 2020-12-23 NOTE — Progress Notes (Signed)
Pt here for monthly B12 injection per Dr Jones.   B12 1000mcg given IM left deltoid and pt tolerated injection well.    

## 2020-12-27 ENCOUNTER — Other Ambulatory Visit: Payer: Self-pay | Admitting: Internal Medicine

## 2020-12-27 ENCOUNTER — Telehealth: Payer: Self-pay | Admitting: *Deleted

## 2020-12-27 DIAGNOSIS — J449 Chronic obstructive pulmonary disease, unspecified: Secondary | ICD-10-CM | POA: Diagnosis not present

## 2020-12-27 DIAGNOSIS — I509 Heart failure, unspecified: Secondary | ICD-10-CM | POA: Diagnosis not present

## 2020-12-27 DIAGNOSIS — C649 Malignant neoplasm of unspecified kidney, except renal pelvis: Secondary | ICD-10-CM | POA: Diagnosis not present

## 2020-12-27 DIAGNOSIS — M1030 Gout due to renal impairment, unspecified site: Secondary | ICD-10-CM

## 2020-12-27 DIAGNOSIS — Z933 Colostomy status: Secondary | ICD-10-CM | POA: Diagnosis not present

## 2020-12-27 NOTE — Telephone Encounter (Signed)
   [facility] Pre-operative Risk Assessment    Patient Name: [name]  DOB: Sep 20, 1940  MRN: [id number]   HEARTCARE STAFF: - Please ensure there is not already an duplicate clearance open for this procedure. - Under Visit Info/Reason for Call, type in Other and utilize the format Clearance MM/DD/YY or Clearance TBD. Do not use dashes or single digits. - If request is for dental extraction, please clarify the # of teeth to be extracted. - If the patient is currently at the dentist's office, call Pre-Op APP to address. If the patient is not currently in the dentist office, please route to the Pre-Op pool  Request for surgical clearance:  1. What type of surgery is being performed? COLOSTOMY REVERSAL   2. When is this surgery scheduled? TBD   3. What type of clearance is required (medical clearance vs. Pharmacy clearance to hold med vs. Both)? MEDICAL  4. Are there any medications that need to be held prior to surgery and how long? NONE LISTED   5. Practice name and name of physician performing surgery? [facility]; DR. Harrell Gave [facility]   6. What is the office phone number? [phone]   7.   What is the office fax number? [place]: Lauralyn Primes, CMA  8.   Anesthesia type (None, local, MAC, general) ? GENERAL   Julaine Hua 12/27/2020, 4:44 PM  _________________________________________________________________   (provider comments below)

## 2020-12-28 NOTE — Telephone Encounter (Signed)
   Name: [name]  DOB: Jul 25, 1941  MRN: [id number]  Primary Cardiologist: Larae Grooms, MD  Chart reviewed as part of pre-operative protocol coverage. Because of [name]'s past medical history and time since last visit, she will require a follow-up visit in order to better assess preoperative cardiovascular risk as she has not been seen in 1 year.   Appt already scheduled for 01/23/21. Appointment notes updated. Will forward to provider so they are aware.  Will route to requesting party via Epic fax function and remove from preop pool as recommendations will be provided at upcoming office visit.   Loel Dubonnet, NP  12/28/2020, 4:39 PM

## 2020-12-30 ENCOUNTER — Telehealth: Payer: Self-pay | Admitting: Pharmacist

## 2020-12-30 NOTE — Progress Notes (Signed)
Chronic Care Management Pharmacy Assistant   Name: [name]  MRN: [id number] DOB: April 19, 1941   Reason for Encounter: Disease State COPD Call   Conditions to be addressed/monitored: COPD   Recent office visits:  None ID  Recent consult visits:  None ID  Hospital visits:  Medication Reconciliation was completed by comparing discharge summary, patient's EMR and Pharmacy list, and upon discussion with patient.  Admitted to the hospital on 08/09/20 due to Irreducible ventral hernia. Discharge date was 08/19/20. Discharged from [facility]?Medications Started at [facility] Discharge:?? -started None ID  Medication Changes at Hospital Discharge: -Changed None ID  Medications Discontinued at Hospital Discharge: -Stopped None ID  Medications that remain the same after Hospital Discharge:??  -All other medications will remain the same.    Medications: Outpatient Encounter Medications as of 12/30/2020  Medication Sig Note  . acetaminophen (TYLENOL) 325 MG tablet Take 325-650 mg by mouth daily as needed for moderate pain, fever or headache.    . albuterol (PROAIR HFA) 108 (90 Base) MCG/ACT inhaler Inhale 1-2 puffs into the lungs every 6 (six) hours as needed for wheezing or shortness of breath.   . allopurinol (ZYLOPRIM) 100 MG tablet TAKE 1 TABLET(100 MG) BY MOUTH DAILY   . ferrous sulfate 325 (65 FE) MG tablet Take 1 tablet (325 mg total) by mouth 2 (two) times daily with a meal.   . fluticasone furoate-vilanterol (BREO ELLIPTA) 100-25 MCG/INH AEPB INHALE 1 PUFF INTO THE LUNGS DAILY   . isosorbide-hydrALAZINE (BIDIL) 20-37.5 MG tablet Take 1 tablet by mouth 3 (three) times daily. (Patient taking differently: Take 1 tablet by mouth 2 (two) times daily.) 06/27/2020: Takes BID due to cost  . metoprolol tartrate (LOPRESSOR) 25 MG tablet Take 1 tablet (25 mg total) by mouth 2 (two) times daily. (Patient taking differently: Take 12.5 mg by mouth 2 (two)  times daily.)   . saccharomyces boulardii (FLORASTOR) 250 MG capsule Take 1 capsule (250 mg total) by mouth 2 (two) times daily.   Marland Kitchen torsemide (DEMADEX) 20 MG tablet Take 1 tablet (20 mg total) by mouth 2 (two) times daily.    Facility-Administered Encounter Medications as of 12/30/2020  Medication  . cyanocobalamin ((VITAMIN B-12)) injection 1,000 mcg   Pharmacist Review  . Current COPD regimen: Albuterol inhale 1-2 puffs every 6 hours, Breo inhale 1 puff daily  No flowsheet data found. . Any recent hospitalizations or ED visits since last visit with CPP? Yes, patient was seen at [facility] in January for a hernia  . Reports feeling sob and not feeling good COPD symptoms, including Shortness of breath at rest   . What recent interventions/DTPs have been made by any provider to improve breathing since last visit: Continue current medications  . Have you had exacerbation/flare-up since last visit? No   . What do you do when you are short of breath?  Rest, when feeling like she can't breath and use her inhaler  Respiratory Devices/Equipment . Do you have a nebulizer? Yes . Do you use a Peak Flow Meter? No . Do you use a maintenance inhaler? Yes . How often do you forget to use your daily inhaler? No . Do you use a rescue inhaler? Yes . How often do you use your rescue inhaler?  infrequently . Do you use a spacer with your inhaler? No  Adherence Review: . Does the patient have >5 day gap between last estimated fill date for maintenance inhaler  medications? No   Star Rating Drugs: None ID  [place] Pharmacist Assistant [phone]  Time spent:

## 2021-01-05 DIAGNOSIS — Z933 Colostomy status: Secondary | ICD-10-CM | POA: Diagnosis not present

## 2021-01-21 NOTE — Progress Notes (Signed)
Cardiology Office Note Date:  01/21/2021  Patient ID:  [name], [name] April 26, 1941, MRN [id number] PCP:  [name], MD  Cardiologist:  Dr. Irish Lack Electrophysiologist: Dr. Rayann Heman    Chief Complaint:    19mo pre-op  History of Present Illness: BTSURUKO MURTHAis a 80y.o. female with history of chronic CHF (diastolic), COPD (chronic obstructive pulmonary disease) with home O2, hx of cor-pulmonale, OSA, intolerant of CPAP, GERD, HTN, HLD, chronic venous stasis, ulcerative colitis, GI AVMs,  CKD (III), morbid obesity, eczema, AFib.  She saw Dr. ARayann Heman Aug 2019, no reported symptoms, metoprolol was reduced 2/2 bradycardia, noting that the patient was very clear that she did not want a ppm, and felt like likely could be avoided.  Mentioned not on a/c 2/2 hx of GI bleeding.  In review of Dr. VHassell Donelast visit, perhaps a component of tachy-brday by history.  I saw her Nov 2019, she reported  doing well. She had self resumed her lopressor at 228mBID with worries that her HR would get to fast, this making her feel uncomfortable and states that she had only noted on a couple of occassions HR 40's previously.  She weighed every morning, checks her BP, O2 sat, and HR as well and then her HR periodically through the day.  She denied any symptoms of brady since the reduction of the lopressor from 5079mID > 1m12mD. No CP, no dizziness, near syncope or syncope.  She had baseline SOB, did not tend to use O2 when out and about, but wears it routinely at home.  This her routine for years.  No SOB/DOE outside of her baseline. We reduced her lopressor back to 12.5mg 34m, she was following her lipids with her PMD.  Hospitalized 07/2018 with hematochezia and a second with concerns of bowel obstruction, did not have surgery.  She saw her PMD last week, mentions some intermittent belly pain, unable to have hernia surgery, no further bleeding.  Planned to manage iron def anemia, chronic blood loss.   Discussed atypical CP, musculoskeletal.  I saw her 11/17/19 She is doing "OK".  Continues with intermittent low belly pain as she discussed with her PMD, BM ore very painful.  No recurrent bleeding. She cares for her adult daughter who suffered a stroke, no exercise, but is able to do her ADLs, and care for her daughter. She says she has palpitations that are notable and bothersome about once a year, feels her heart rate "all over the place", lasts a day, then simmers down. She will occasionally maybe once a month, find her HR 40's, this is brief with a feeling of a bit "swimmy headed", never any near syncope or syncope. Her CP is a fleeting stabbing that is rare and random She wears O2 at night, no symptoms of PND or orthopnea.  NO rest SOB, no unusual DOE. No changes were made   She saw Dr. AllreRayann Heman/21, having issues with prior GI bleeding and has required iron infusions for iron deficiency anemia.  She is chronically SOB and not very active.  Occasional dizziness.  + dependant edema.  Does not use her CPAP. Given her comorbid conditions, not convinced her bradycardia as primary driver for her symptoms, HRs generally 50's, some lower Planned for EP APP visit in 54mo. 48mo 2021 she had a prolonged hospital stay with an incarcerated hernia ended up with a colostomy, she was found with renal mass planned to f/u out pt for this  She is pending a colostomy reversal, requiring clearance.  TODAY She reports that hse is considering colostomy reversal with some discomfort and another hernia now at the stoma, though will require pulmonary, urology, PMD and our clearance, so she isnt certain. She has had marked functional decline since her hospital stay, never has felt like she got back to her baseline, walking with a walker now.  She states she saw urology after her hospitalization and was confirmed that she has renal cancer R side with a mass on L as well, though this reportedly not cancer.  There  are no plans for treatment for the cancer, by her understanding of the conversation she was told that it is a slow growing cancer and that the treatment would likely lead to dialysis and her renal cancer would not be what ends her life.  She reports SOB at her baseline, she is here without oxygen today says it is too cumbersome with the walker to manage her O2 tank.  She is having some CP.  Says her pharmacy ran out of stock of her Bidil and was without it for a couple weeks, she started to have a gripping CP L chest, it struck her as muscular, like a cramp maybe, lasted sometimes all day, no particular change with position or exertion.  Since back on her BIDIL now a week, this is better but not resolved, has had it a couple times, lasting a couple minutes. When it was lasting all day seemed to make her feel more weak and tired.  Not any change in her breathing, no N/V. None today or the last couple days.  She does have fleeting moments where everything goes black, just for a second, this she correlates to fast HRs and a sudden drop gives an example of 130's>40's This she say though is not new for her, has been happening for years, she has never had full syncope Unclear wether the behavior of this symptoms is the same or escalating, but does say that sometimes worries her.  RCRI score is 2 = 6.6% risk    Past Medical History:  Diagnosis Date   Allergy    Angiodysplasia of cecum    Angiodysplasia of duodenum    Arthritis    Asteatotic eczema 02/25/2016   Asthma    Atrial fibrillation ([place]) 10/17/2017   Blood transfusion without reported diagnosis    Cataract    CHF (congestive heart failure), NYHA class III, chronic, combined ([place]) 08/10/2017   Colonic polyp 02/16/2008   Tubular adenoma   COPD (chronic obstructive pulmonary disease) ([place]) 01/27/98   Cor pulmonale ([place]) 11/16/2014   GERD 07/30/1992   Gout    HTN (hypertension) 07/30/1988   Hyperlipidemia with target LDL less than 100 11/27/1996         Kidney stone 1960, 1972, 1991   MGUS (monoclonal gammopathy of unknown significance) 11/08/2016   Morbid obesity ([place]) 04/19/2010   She agrees to work on her lifestyle modifications to help her lose weight.    OSA (obstructive sleep apnea) 06/22/2013   Osteopenia, senile 02/05/2013   July 2014  -2.1 left femur -1.9 left forearm    Prediabetes 11/13/2006   Renal insufficiency    Stenosis of cervical spine with myelopathy ([place]) 01/01/2018   Ulcer of leg, chronic, right ([place]) 10/10/2011   ULCERATIVE COLITIS-LEFT SIDE 03/19/2008        Vitamin B12 deficiency anemia 11/13/2006        Vitamin D deficiency 02/06/2013  Past Surgical History:  Procedure Laterality Date   BRONCHOSCOPY     COLONOSCOPY     COLONOSCOPY WITH PROPOFOL N/A 02/23/2015   Procedure: COLONOSCOPY WITH PROPOFOL;  Surgeon: Ladene Artist, MD;  Location: [facility];  Service: Endoscopy;  Laterality: N/A;   COLONOSCOPY WITH PROPOFOL N/A 08/12/2018   Procedure: COLONOSCOPY WITH PROPOFOL;  Surgeon: Ladene Artist, MD;  Location: [facility];  Service: Endoscopy;  Laterality: N/A;   COLONOSCOPY WITH PROPOFOL N/A 05/09/2020   Procedure: COLONOSCOPY WITH PROPOFOL;  Surgeon: Ladene Artist, MD;  Location: [facility];  Service: Endoscopy;  Laterality: N/A;  To Splenic Flexture   ESOPHAGOGASTRODUODENOSCOPY (EGD) WITH PROPOFOL N/A 02/23/2015   Procedure: ESOPHAGOGASTRODUODENOSCOPY (EGD) WITH PROPOFOL;  Surgeon: Ladene Artist, MD;  Location: [facility];  Service: Endoscopy;  Laterality: N/A;   ESOPHAGOGASTRODUODENOSCOPY (EGD) WITH PROPOFOL N/A 08/12/2018   Procedure: ESOPHAGOGASTRODUODENOSCOPY (EGD) WITH PROPOFOL;  Surgeon: Ladene Artist, MD;  Location: [facility];  Service: Endoscopy;  Laterality: N/A;   ESOPHAGOGASTRODUODENOSCOPY (EGD) WITH PROPOFOL N/A 05/08/2020   Procedure: ESOPHAGOGASTRODUODENOSCOPY (EGD) WITH PROPOFOL;  Surgeon: Yetta Flock, MD;  Location: [facility];  Service: Gastroenterology;   Laterality: N/A;   HOT HEMOSTASIS N/A 08/12/2018   Procedure: HOT HEMOSTASIS (ARGON PLASMA COAGULATION/BICAP);  Surgeon: Ladene Artist, MD;  Location: [facility];  Service: Endoscopy;  Laterality: N/A;  EGD and Colon APC   HOT HEMOSTASIS N/A 05/08/2020   Procedure: HOT HEMOSTASIS (ARGON PLASMA COAGULATION/BICAP);  Surgeon: Yetta Flock, MD;  Location: [facility];  Service: Gastroenterology;  Laterality: N/A;   LAPAROTOMY N/A 08/11/2020   Procedure: EXPLORATORY LAPAROTOMY PARTIAL COLECTOMY AND COLOSTOMY WITH RESECTION OF A MESSENTERIC MASS;  Surgeon: Coralie Keens, MD;  Location: [facility];  Service: General;  Laterality: N/A;   POLYPECTOMY      Current Outpatient Medications  Medication Sig Dispense Refill   acetaminophen (TYLENOL) 325 MG tablet Take 325-650 mg by mouth daily as needed for moderate pain, fever or headache.      albuterol (PROAIR HFA) 108 (90 Base) MCG/ACT inhaler Inhale 1-2 puffs into the lungs every 6 (six) hours as needed for wheezing or shortness of breath. 18 g 11   allopurinol (ZYLOPRIM) 100 MG tablet TAKE 1 TABLET(100 MG) BY MOUTH DAILY 90 tablet 1   ferrous sulfate 325 (65 FE) MG tablet Take 1 tablet (325 mg total) by mouth 2 (two) times daily with a meal. 180 tablet 1   fluticasone furoate-vilanterol (BREO ELLIPTA) 100-25 MCG/INH AEPB INHALE 1 PUFF INTO THE LUNGS DAILY 120 each 1   isosorbide-hydrALAZINE (BIDIL) 20-37.5 MG tablet Take 1 tablet by mouth 3 (three) times daily. (Patient taking differently: Take 1 tablet by mouth 2 (two) times daily.) 270 tablet 1   metoprolol tartrate (LOPRESSOR) 25 MG tablet Take 1 tablet (25 mg total) by mouth 2 (two) times daily. (Patient taking differently: Take 12.5 mg by mouth 2 (two) times daily.) 180 tablet 1   saccharomyces boulardii (FLORASTOR) 250 MG capsule Take 1 capsule (250 mg total) by mouth 2 (two) times daily. 180 capsule 1   torsemide (DEMADEX) 20 MG tablet Take 1 tablet (20 mg total) by mouth 2 (two) times  daily. 180 tablet 1   Current Facility-Administered Medications  Medication Dose Route Frequency Provider Last Rate Last Admin   cyanocobalamin ((VITAMIN B-12)) injection 1,000 mcg  1,000 mcg Intramuscular Q30 days [name], MD   1,000 mcg at 11/24/20 1417    Allergies:   Celebrex [celecoxib], Amlodipine besylate, Enalapril, Lipitor [  atorvastatin], Amoxicillin, Codeine sulfate, and Hydrocodone-acetaminophen   Social History:  The patient  reports that she quit smoking about 24 years ago. She has never used smokeless tobacco. She reports that she does not drink alcohol and does not use drugs.   Family History:  The patient's family history includes Cervical cancer in her sister; Clotting disorder in her sister; Crohn's disease in her brother; Diabetes in her mother and sister; Heart attack in her sister; Heart disease in her brother, brother, mother, and sister; Kidney cancer in her mother; Lung cancer in her sister; Stomach cancer in her mother; Stroke in her father and mother.  ROS:  Please see the history of present illness.  All other systems are reviewed and otherwise negative.   PHYSICAL EXAM:  VS:  There were no vitals taken for this visit. BMI: There is no height or weight on file to calculate BMI. Well nourished, well developed, in no acute distress  HEENT: normocephalic, atraumatic  Neck: no JVD, carotid bruits or masses Cardiac:  RRR; 2/6 SM, no rubs, or gallops Lungs: CTA b/l, no wheezing, rhonchi or rales  Abd: soft, nontender MS: no deformity, age appropriate atrophy Ext: 1++ edema, chronic looking skin changes, very superficial small wounds shins b/l Skin: warm and dry, no rash Neuro:  No gross deficits appreciated Psych: euthymic mood, full affect   EKG:  Done today and reviewed by myself:  SB 58bpm, no ST.T changes   05/11/2020: TTE IMPRESSIONS   1. Left ventricular ejection fraction, by estimation, is 60 to 65%. The  left ventricle has normal function.  The left ventricle has no regional  wall motion abnormalities. There is moderate concentric left ventricular  hypertrophy. Left ventricular  diastolic parameters are consistent with Grade II diastolic dysfunction  (pseudonormalization).   2. Right ventricular systolic function is normal. The right ventricular  size is normal.   3. Left atrial size was mildly dilated.   4. The mitral valve is normal in structure. Trivial mitral valve  regurgitation.   5. The aortic valve is tricuspid. There is mild calcification of the  aortic valve. There is mild thickening of the aortic valve. Aortic valve  regurgitation is trivial. Mild aortic valve sclerosis is present, with no  evidence of aortic valve stenosis.   6. The inferior vena cava is normal in size with greater than 50%  respiratory variability, suggesting right atrial pressure of 3 mmHg.     10/18/17: TTE Study Conclusions - Left ventricle: The cavity size was normal. There was moderate   concentric hypertrophy. Systolic function was vigorous. The   estimated ejection fraction was in the range of 65% to 70%. Wall   motion was normal; there were no regional wall motion   abnormalities. The study is not technically sufficient to allow   evaluation of LV diastolic function. - Aortic valve: Trileaflet; mildly thickened, mildly calcified   leaflets. - Mitral valve: There was mild regurgitation.    Recent Labs: 08/13/2020: ALT 11 08/14/2020: Magnesium 2.3 09/28/2020: BUN 31; Creatinine, Ser 1.34; Hemoglobin 8.9; Platelets 238.0; Potassium 4.6; Sodium 136  02/04/2020: Cholesterol 165; HDL 49; LDL Cholesterol (Calc) 90; Total CHOL/HDL Ratio 3.4; Triglycerides 162   CrCl cannot be calculated (Patient's most recent lab result is older than the maximum 21 days allowed.).   Wt Readings from Last 3 Encounters:  09/28/20 181 lb (82.1 kg)  08/09/20 189 lb 13.1 oz (86.1 kg)  08/03/20 183 lb 6.4 oz (83.2 kg)     Other studies  reviewed: Additional studies/records reviewed today include: summarized above  ASSESSMENT AND PLAN:  1. Bradycardia     Has been largely asymptomatic, patient had wanted to avoid PPM      She reports intermittent episodes of momentarily things going black, unclear that this is escalating in frequency or behavior has had these for years, but are becoming more worrisome to her Will plan a ZIo AT  We can plan to back off her metoprolol again ifi any significant brady is noted   2. HTN     No changes today   3. Paroxysmal AFib     CHA2DS2Vasc is 4     not on a/c 2/2 GI bleeds     Unclear burden she seems to attach fast rates to these black moments that can happen some days a couple times and then not again for "a while" Monitor as noted above  4. Edema 5. Chronic CHF (diastolic)     Chronic venous stasis     No unusual SOB, lungs are clear     LE remain at her baseline (by her estimation)     Continue her torsemide  6. CP somewhat atypical though seemed to have been provoked when off her BIDIL She has risk and is pending a possible surgery Will plan a lexiscan stress test     7. Pre-op clearance Colostomy reversal at least a moderate risk surgery She is having some degree of CP, has had marked physical decline in the last 58moafter her hospitalization Pending test results    Disposition: plan a 384moollow up for now, sooner if needed pending her test results.     Current medicines are reviewed at length with the patient today.  The patient did not have any concerns regarding medicines.  SiVenetia NightPA-C 01/21/2021 11:29 AM     [place] [phone])  [phone])

## 2021-01-23 ENCOUNTER — Ambulatory Visit (INDEPENDENT_AMBULATORY_CARE_PROVIDER_SITE_OTHER): Payer: Medicare Other

## 2021-01-23 ENCOUNTER — Ambulatory Visit: Payer: Medicare Other | Admitting: Physician Assistant

## 2021-01-23 ENCOUNTER — Encounter: Payer: Self-pay | Admitting: *Deleted

## 2021-01-23 ENCOUNTER — Encounter: Payer: Self-pay | Admitting: Physician Assistant

## 2021-01-23 ENCOUNTER — Other Ambulatory Visit: Payer: Self-pay

## 2021-01-23 VITALS — BP 142/40 | HR 64 | Ht 60.0 in | Wt 181.8 lb

## 2021-01-23 DIAGNOSIS — I480 Paroxysmal atrial fibrillation: Secondary | ICD-10-CM

## 2021-01-23 DIAGNOSIS — R079 Chest pain, unspecified: Secondary | ICD-10-CM | POA: Diagnosis not present

## 2021-01-23 DIAGNOSIS — I5032 Chronic diastolic (congestive) heart failure: Secondary | ICD-10-CM

## 2021-01-23 DIAGNOSIS — I495 Sick sinus syndrome: Secondary | ICD-10-CM | POA: Diagnosis not present

## 2021-01-23 DIAGNOSIS — I10 Essential (primary) hypertension: Secondary | ICD-10-CM | POA: Diagnosis not present

## 2021-01-23 DIAGNOSIS — R55 Syncope and collapse: Secondary | ICD-10-CM | POA: Diagnosis not present

## 2021-01-23 NOTE — Progress Notes (Unsigned)
Patient enrolled for [employer] to mail a 14 day ZIO AT monitor to address on file.

## 2021-01-23 NOTE — Patient Instructions (Signed)
Medication Instructions:   Your physician recommends that you continue on your current medications as directed. Please refer to the Current Medication list given to you today.   *If you need a refill on your cardiac medications before your next appointment, please call your pharmacy*   Lab Work: [name]   If you have labs (blood work) drawn today and your tests are completely normal, you will receive your results only by: [name] (if you have MyChart) OR A paper copy in the mail If you have any lab test that is abnormal or we need to change your treatment, we will call you to review the results.   Testing/Procedures: Your physician has requested that you have a lexiscan myoview. For further information please visit [email or web address]. Please follow instruction sheet, as given.  Your physician has recommended that you wear an event monitor. Event monitors are medical devices that record the heart's electrical activity. Doctors most often [place] these monitors to diagnose arrhythmias. Arrhythmias are problems with the speed or rhythm of the heartbeat. The monitor is a small, portable device. You can wear one while you do your normal daily activities. This is usually used to diagnose what is causing palpitations/syncope (passing out).   Follow-Up: At [name], you and your health needs are our priority.  As part of our continuing mission to provide you with exceptional heart care, we have created designated Provider Care Teams.  These Care Teams include your primary Cardiologist (physician) and Advanced Practice Providers (APPs -  Physician Assistants and Nurse Practitioners) who all work together to provide you with the care you need, when you need it.  We recommend signing up for the patient portal called "MyChart".  Sign up information is provided on this After Visit Summary.  MyChart is used to connect with patients for Virtual Visits (Telemedicine).  Patients are  able to view lab/test results, encounter notes, upcoming appointments, etc.  Non-urgent messages can be sent to your provider as well.   To learn more about what you can do with MyChart, go to [email or web address].    Your next appointment:   3 month(s)  The format for your next appointment:   In Person  Provider:   You may see Thompson Grayer, MD or one of the following Advanced Practice Providers on your designated Care Team:  Legrand Como "Jonni Sanger" Chalmers Cater, [place]   Other Instructions  ZIO AT Long term monitor-Live Telemetry  Your physician has requested you wear a ZIO patch monitor for 14 days.  This is a single patch monitor. [employer] supplies one patch monitor per enrollment. Additional  stickers are not available.  Please do not apply patch if you will be having a Nuclear Stress Test, Echocardiogram, Cardiac CT, MRI,  or Chest Xray during the period you would be wearing the monitor. The patch cannot be worn during  these tests. You cannot remove and re-apply the ZIO AT patch monitor.  Your ZIO patch monitor will be mailed 3 day USPS to your address on file. It may take 3-5 days to  receive your monitor after you have been enrolled.  Once you have received your monitor, please review the enclosed instructions. Your monitor has  already been registered assigning a specific monitor serial # to you.   Billing and Patient Assistance Program information  [name] has been supplied with any insurance information on record for billing. [employer] offers a sliding scale Patient Assistance Program for patients without insurance, or whose  insurance does not completely  cover the cost of the ZIO patch monitor. You must apply for the  Patient Assistance Program to qualify for the discounted rate. To apply, call [employer] at [phone],  select option 4, select option 2 , ask to apply for the Patient Assistance Program, (you can request an  interpreter if needed). [employer] will ask your household income  and how many people are in your  household. [employer] will quote your out-of-pocket cost based on this information. They will also be able  to set up a 12 month interest free payment plan if needed.  Applying the monitor   Shave hair from upper left chest.  Hold the abrader disc by orange tab. Rub the abrader in 40 strokes over left upper chest as indicated in  your monitor instructions.  Clean area with 4 enclosed alcohol pads. Use all pads to ensure the area is cleaned thoroughly. Let  dry.  Apply patch as indicated in monitor instructions. Patch will be placed under collarbone on left side of  chest with arrow pointing upward.  Rub patch adhesive wings for 2 minutes. Remove the white label marked "1". Remove the white label  marked "2". Rub patch adhesive wings for 2 additional minutes.  While looking in a mirror, press and release button in center of patch. A small green light will flash 3-4  times. This will be your only indicator that the monitor has been turned on.  Do not shower for the first 24 hours. You may shower after the first 24 hours.  Press the button if you feel a symptom. You will hear a small click. Record Date, Time and Symptom in  the Patient Log.   Starting the Gateway  In your kit there is a Hydrographic surveyor box the size of a cellphone. This is Airline pilot. It transmits all your  recorded data to [facility]. This box must always stay within 10 feet of you. Open the box and push the *  button. There will be a light that blinks orange and then green a few times. When the light stops  blinking, the Gateway is connected to the ZIO patch. Call [employer] at [phone] to confirm your monitor is transmitting.  Returning your monitor  Remove your patch and place it inside the [place]. In the lower half of the Gateway there is a white  bag with prepaid postage on it. Place Gateway in bag and seal. Mail package back to [place] as soon as  possible. Your physician should have  your final report approximately 7 days after you have mailed back  your monitor. Call [place] at [phone] if you have questions regarding your ZIO AT  patch monitor. Call them immediately if you see an orange light blinking on your monitor.  If your monitor falls off in less than 4 days, contact our Monitor department at [phone]. If your  monitor becomes loose or falls off after 4 days call [employer] at [phone] for suggestions on  securing your monitor

## 2021-01-24 ENCOUNTER — Telehealth: Payer: Self-pay | Admitting: Internal Medicine

## 2021-01-24 ENCOUNTER — Other Ambulatory Visit: Payer: Self-pay | Admitting: *Deleted

## 2021-01-24 DIAGNOSIS — R079 Chest pain, unspecified: Secondary | ICD-10-CM

## 2021-01-24 NOTE — Telephone Encounter (Signed)
Follow up:     [name] calling from [facility] concerning to check the status off this patient  medical clearance Please advise.

## 2021-01-25 NOTE — Telephone Encounter (Signed)
Spoke with Heriberto Antigua and made her aware that the patient is having a stress test on 02/08/21 and is currently wearing a monitor. I advised her that we will be in touch with her office once the testing has been completed.  She voiced understanding and thanked me for my call.

## 2021-01-25 NOTE — Telephone Encounter (Signed)
Pt is scheduled to undergo nuclear stress test on 02/08/21. She is also pending heart monitor completion.

## 2021-01-26 NOTE — Progress Notes (Signed)
    Chronic Care Management Pharmacy Assistant   Name: [name]  MRN: [id number] DOB: [birth year]   Reason for Encounter: Chart Review    Medications: Outpatient Encounter Medications as of 12/30/2020  Medication Sig   acetaminophen (TYLENOL) 325 MG tablet Take 325-650 mg by mouth daily as needed for moderate pain, fever or headache.    albuterol (PROAIR HFA) 108 (90 Base) MCG/ACT inhaler Inhale 1-2 puffs into the lungs every 6 (six) hours as needed for wheezing or shortness of breath.   allopurinol (ZYLOPRIM) 100 MG tablet TAKE 1 TABLET(100 MG) BY MOUTH DAILY   ferrous sulfate 325 (65 FE) MG tablet Take 1 tablet (325 mg total) by mouth 2 (two) times daily with a meal.   fluticasone furoate-vilanterol (BREO ELLIPTA) 100-25 MCG/INH AEPB INHALE 1 PUFF INTO THE LUNGS DAILY   isosorbide-hydrALAZINE (BIDIL) 20-37.5 MG tablet Take 1 tablet by mouth 3 (three) times daily.   metoprolol tartrate (LOPRESSOR) 25 MG tablet Take 1 tablet (25 mg total) by mouth 2 (two) times daily.   torsemide (DEMADEX) 20 MG tablet Take 1 tablet (20 mg total) by mouth 2 (two) times daily.   [DISCONTINUED] saccharomyces boulardii (FLORASTOR) 250 MG capsule Take 1 capsule (250 mg total) by mouth 2 (two) times daily. (Patient not taking: Reported on 01/23/2021)   Facility-Administered Encounter Medications as of 12/30/2020  Medication   cyanocobalamin ((VITAMIN B-12)) injection 1,000 mcg   Pharmacist Review  Reviewed chart for medication changes and adherence.  Recent OV, Consult or Hospital visit: 12/30/20 Renee-Lynn Percell Locus PA-C Cardiology Recent medication changes indicated: Patient no longer taking saccharomyces boulardii  No gaps in adherence identified. Patient has follow up scheduled with pharmacy team. No further action required.   [place] Pharmacist Assistant [phone]   Time spent:8

## 2021-01-28 DIAGNOSIS — R55 Syncope and collapse: Secondary | ICD-10-CM | POA: Diagnosis not present

## 2021-01-29 DIAGNOSIS — R55 Syncope and collapse: Secondary | ICD-10-CM | POA: Diagnosis not present

## 2021-01-31 ENCOUNTER — Encounter: Payer: Self-pay | Admitting: Internal Medicine

## 2021-01-31 ENCOUNTER — Ambulatory Visit (INDEPENDENT_AMBULATORY_CARE_PROVIDER_SITE_OTHER): Payer: Medicare Other | Admitting: Internal Medicine

## 2021-01-31 ENCOUNTER — Other Ambulatory Visit: Payer: Self-pay

## 2021-01-31 VITALS — BP 144/76 | HR 68 | Temp 98.0°F | Resp 16 | Ht 60.0 in | Wt 181.0 lb

## 2021-01-31 DIAGNOSIS — D500 Iron deficiency anemia secondary to blood loss (chronic): Secondary | ICD-10-CM

## 2021-01-31 DIAGNOSIS — J411 Mucopurulent chronic bronchitis: Secondary | ICD-10-CM

## 2021-01-31 DIAGNOSIS — D510 Vitamin B12 deficiency anemia due to intrinsic factor deficiency: Secondary | ICD-10-CM

## 2021-01-31 DIAGNOSIS — I872 Venous insufficiency (chronic) (peripheral): Secondary | ICD-10-CM

## 2021-01-31 DIAGNOSIS — I5042 Chronic combined systolic (congestive) and diastolic (congestive) heart failure: Secondary | ICD-10-CM | POA: Diagnosis not present

## 2021-01-31 DIAGNOSIS — E538 Deficiency of other specified B group vitamins: Secondary | ICD-10-CM

## 2021-01-31 LAB — CBC WITH DIFFERENTIAL/PLATELET
Basophils Absolute: 0 10*3/uL (ref 0.0–0.1)
Basophils Relative: 0.5 % (ref 0.0–3.0)
Eosinophils Absolute: 0.2 10*3/uL (ref 0.0–0.7)
Eosinophils Relative: 2.3 % (ref 0.0–5.0)
HCT: 29.1 % — ABNORMAL LOW (ref 36.0–46.0)
Hemoglobin: 9.6 g/dL — ABNORMAL LOW (ref 12.0–15.0)
Lymphocytes Relative: 17.4 % (ref 12.0–46.0)
Lymphs Abs: 1.6 10*3/uL (ref 0.7–4.0)
MCHC: 33.1 g/dL (ref 30.0–36.0)
MCV: 80 fl (ref 78.0–100.0)
Monocytes Absolute: 0.9 10*3/uL (ref 0.1–1.0)
Monocytes Relative: 9.9 % (ref 3.0–12.0)
Neutro Abs: 6.3 10*3/uL (ref 1.4–7.7)
Neutrophils Relative %: 69.9 % (ref 43.0–77.0)
Platelets: 222 10*3/uL (ref 150.0–400.0)
RBC: 3.63 Mil/uL — ABNORMAL LOW (ref 3.87–5.11)
RDW: 15.5 % (ref 11.5–15.5)
WBC: 8.9 10*3/uL (ref 4.0–10.5)

## 2021-01-31 LAB — IRON: Iron: 50 ug/dL (ref 42–145)

## 2021-01-31 LAB — FERRITIN: Ferritin: 28 ng/mL (ref 10.0–291.0)

## 2021-01-31 LAB — FOLATE: Folate: 9.4 ng/mL (ref >5.9)

## 2021-01-31 MED ORDER — ISOSORB DINITRATE-HYDRALAZINE 20-37.5 MG PO TABS: 1.0000 | ORAL_TABLET | Freq: Three times a day (TID) | ORAL | 1 refills | Status: DC

## 2021-01-31 MED ORDER — TRELEGY ELLIPTA 100-62.5-25 MCG/INH IN AEPB: 1.0000 | INHALATION_SPRAY | Freq: Every day | RESPIRATORY_TRACT | 1 refills | Status: DC

## 2021-01-31 MED ORDER — VASCULERA PO TABS: 1.0000 | ORAL_TABLET | Freq: Every day | ORAL | 1 refills | Status: DC

## 2021-01-31 NOTE — Progress Notes (Signed)
Subjective:  Patient ID: [name], female    DOB: 09/02/1940  Age: 80 y.o. MRN: [id number]  CC: Anemia and COPD  This visit occurred during the SARS-CoV-2 public health emergency.  Safety protocols were in place, including screening questions prior to the visit, additional usage of staff PPE, and extensive cleaning of exam room while observing appropriate contact time as indicated for disinfecting solutions.    HPI DANIELLE MINK presents for f/up -   She has chronic unchanged symptoms that include laryngitis, fatigue, chest pain that she describes as a stretching sensation, rare wheezing, shortness of breath, and cough that is rarely productive of yellow phlegm.  A few weeks ago she was having some bright red blood per rectum but for the last 2 weeks she has not seen any more blood.  She has rare episodes of abdominal pain.  She denies nausea, vomiting, loss of appetite.  She has an incisional hernia that need may need to be repaired.  Outpatient Medications Prior to Visit  Medication Sig Dispense Refill   acetaminophen (TYLENOL) 325 MG tablet Take 325-650 mg by mouth daily as needed for moderate pain, fever or headache.      albuterol (PROAIR HFA) 108 (90 Base) MCG/ACT inhaler Inhale 1-2 puffs into the lungs every 6 (six) hours as needed for wheezing or shortness of breath. 18 g 11   allopurinol (ZYLOPRIM) 100 MG tablet TAKE 1 TABLET(100 MG) BY MOUTH DAILY 90 tablet 1   ferrous sulfate 325 (65 FE) MG tablet Take 1 tablet (325 mg total) by mouth 2 (two) times daily with a meal. 180 tablet 1   metoprolol tartrate (LOPRESSOR) 25 MG tablet Take 1 tablet (25 mg total) by mouth 2 (two) times daily. 180 tablet 1   promethazine (PHENERGAN) 25 MG tablet Take 25 mg by mouth every 4 (four) hours as needed for nausea or vomiting.     torsemide (DEMADEX) 20 MG tablet Take 1 tablet (20 mg total) by mouth 2 (two) times daily. 180 tablet 1   fluticasone furoate-vilanterol (BREO ELLIPTA) 100-25  MCG/INH AEPB INHALE 1 PUFF INTO THE LUNGS DAILY 120 each 1   isosorbide-hydrALAZINE (BIDIL) 20-37.5 MG tablet Take 1 tablet by mouth 3 (three) times daily. 270 tablet 1   Facility-Administered Medications Prior to Visit  Medication Dose Route Frequency Provider Last Rate Last Admin   cyanocobalamin ((VITAMIN B-12)) injection 1,000 mcg  1,000 mcg Intramuscular Q30 days [name], MD   1,000 mcg at 01/31/21 1507    ROS Review of Systems  Constitutional:  Positive for fatigue. Negative for chills, diaphoresis and unexpected weight change.  HENT: Negative.    Eyes: Negative.   Respiratory:  Positive for cough, shortness of breath and wheezing. Negative for chest tightness.   Cardiovascular:  Positive for chest pain. Negative for palpitations and leg swelling.  Gastrointestinal:  Positive for blood in stool. Negative for abdominal pain, diarrhea, nausea and vomiting.  Endocrine: Negative.   Genitourinary:  Negative for difficulty urinating.  Musculoskeletal:  Negative for arthralgias and myalgias.  Skin: Negative.   Neurological: Negative.   Hematological:  Negative for adenopathy. Does not bruise/bleed easily.   Objective:  BP (!) 144/76 (BP Location: Right Arm, Patient Position: Sitting, Cuff Size: Large)   Pulse 68   Temp 98 F (36.7 C) (Oral)   Resp 16   Ht 5' (1.524 m)   Wt 181 lb (82.1 kg)   SpO2 95%   BMI 35.35 kg/m   BP  Readings from Last 3 Encounters:  01/31/21 (!) 144/76  01/23/21 (!) 142/40  09/28/20 (!) 142/86    Wt Readings from Last 3 Encounters:  01/31/21 181 lb (82.1 kg)  01/23/21 181 lb 12.8 oz (82.5 kg)  09/28/20 181 lb (82.1 kg)    Physical Exam HENT:     Mouth/Throat:     Mouth: Mucous membranes are moist.  Eyes:     General: No scleral icterus.    Conjunctiva/sclera: Conjunctivae normal.  Cardiovascular:     Rate and Rhythm: Normal rate. Rhythm irregularly irregular.     Heart sounds: Murmur heard.  Systolic murmur is present with a grade  of 3/6.  No diastolic murmur is present.    No gallop.  Pulmonary:     Effort: Pulmonary effort is normal.     Breath sounds: Examination of the right-lower field reveals rales. Examination of the left-lower field reveals rales. Rales present. No decreased breath sounds, wheezing or rhonchi.  Abdominal:     General: Abdomen is protuberant. Bowel sounds are normal. There is no distension.     Palpations: There is no hepatomegaly, splenomegaly or mass.     Hernia: A hernia is present. Hernia is present in the ventral area.  Musculoskeletal:     Right lower leg: No edema.     Left lower leg: No edema.     Comments: Over the lower extremities there are venous stasis changes but no wounds, ulcers, exudates, erythema, warmth, or streaking.  Skin:    General: Skin is warm.     Coloration: Skin is pale. Skin is not jaundiced.  Neurological:     General: No focal deficit present.     Mental Status: She is alert.    Lab Results  Component Value Date   WBC 8.1 09/28/2020   HGB 8.9 (L) 09/28/2020   HCT 26.7 Repeated and verified X2. (L) 09/28/2020   PLT 238.0 09/28/2020   GLUCOSE 98 09/28/2020   CHOL 165 02/04/2020   TRIG 162 (H) 02/04/2020   HDL 49 (L) 02/04/2020   LDLDIRECT 107.0 09/04/2016   LDLCALC 90 02/04/2020   ALT 11 08/13/2020   AST 14 (L) 08/13/2020   NA 136 09/28/2020   K 4.6 09/28/2020   CL 101 09/28/2020   CREATININE 1.34 (H) 09/28/2020   BUN 31 (H) 09/28/2020   CO2 31 09/28/2020   TSH 2.35 07/02/2019   INR 1.2 08/13/2020   HGBA1C 5.4 11/05/2017   MICROALBUR <0.7 11/05/2017    CT ABDOMEN PELVIS W CONTRAST  Result Date: 08/09/2020 CLINICAL DATA:  Persistent abdominal pain from known hernia x3 weeks. EXAM: CT ABDOMEN AND PELVIS WITH CONTRAST TECHNIQUE: Multidetector CT imaging of the abdomen and pelvis was performed using the standard protocol following bolus administration of intravenous contrast. CONTRAST:  66m OMNIPAQUE IOHEXOL 300 MG/ML  SOLN COMPARISON:  February 08, 2020 FINDINGS: Lower chest: There is atelectasis at the lung bases. There is a fat containing Bochdalek hernia on the right.The heart size is normal. Hepatobiliary: The liver is normal. Normal gallbladder.There is no biliary ductal dilation. Pancreas: Normal contours without ductal dilatation. No peripancreatic fluid collection. Spleen: There is splenomegaly with the spleen measuring approximately 13 cm craniocaudad. Adrenals/Urinary Tract: --Adrenal glands: Unremarkable. --Right kidney/ureter: There is a solid exophytic mass arising from the interpolar region of the right kidney measuring 3.5 cm (axial series 2, image 30). Multiple cysts are noted throughout the right kidney. There is no right-sided hydronephrosis. --Left kidney/ureter: Multiple cysts are noted. --Urinary  bladder: Unremarkable. Stomach/Bowel: --Stomach/Duodenum: No hiatal hernia or other gastric abnormality. Normal duodenal course and caliber. --Small bowel: Unremarkable. --Colon: There is a moderate amount of stool at the level of the rectum. There is scattered colonic diverticula without CT evidence for diverticulitis. Again noted is a large ventral wall hernia containing a loop of the transverse colon in addition to portions of the patient's appendix. There is now a large air and fluid collection in the hernia sac measuring approximately 9.4 x 7.2 cm. There is significant adjacent fat stranding and overlying skin thickening. --Appendix: The visualized portions of the intraperitoneal appendix are unremarkable. The portions that are herniated are difficult to evaluate. Vascular/Lymphatic: Atherosclerotic calcification is present within the non-aneurysmal abdominal aorta, without hemodynamically significant stenosis. --there are few prominent, nonspecific retroperitoneal lymph nodes. --No mesenteric lymphadenopathy. --No pelvic or inguinal lymphadenopathy. Reproductive: Unremarkable Other: Again noted is a large ventral wall hernia containing  portions of the transverse colon as and a large volume of peritoneal fat. Musculoskeletal. There is diffuse osteopenia which limits detection of nondisplaced fractures. There is no definite acute displaced fracture identified on today's study. IMPRESSION: 1. Large ventral wall hernia containing a loop of the transverse colon. While there is no evidence for an obstruction, there is a new large air and fluid collection within the hernia sac concerning for perforation of the transverse colon with resultant abscess formation or complicated diverticulitis. Alternatively, a portion of the appendix is located within the hernia sac. As such, the differential includes complicated appendicitis, although this is felt to be less likely. 2. There is a 3.5 cm right renal mass consistent with renal cell carcinoma until proven otherwise. 3. Splenomegaly. 4. Additional chronic findings as detailed above. Aortic Atherosclerosis (ICD10-I70.0). These results were called by telephone at the time of interpretation on 08/09/2020 at 2:57 am to provider DAN FLOYD , who verbally acknowledged these results. Electronically Signed   By: Constance Holster M.D.   On: 08/09/2020 03:00   DG CHEST PORT 1 VIEW  Result Date: 08/09/2020 CLINICAL DATA:  80 year old female with history of COVID infection with increasing shortness of breath. EXAM: PORTABLE CHEST 1 VIEW COMPARISON:  Chest x-ray 01/01/2018. FINDINGS: Ill-defined opacities and areas of mild interstitial prominence are noted throughout the mid to lower lungs bilaterally. Small left pleural effusion or chronic pleuroparenchymal scarring, stable compared to remote prior study from 2019. No pneumothorax. No evidence of pulmonary edema. Heart size is mildly enlarged. Upper mediastinal contours are within normal limits. Aortic atherosclerosis. IMPRESSION: 1. There are subtle findings in the mid to lower lungs which suggest developing multilobar bilateral pneumonia, compatible with reported  COVID infection. 2. Mild cardiomegaly. 3. Aortic atherosclerosis. Electronically Signed   By: Vinnie Langton M.D.   On: 08/09/2020 09:14   [place] IMAGE GUIDED FLUID DRAIN BY CATHETER  Result Date: 08/09/2020 INDICATION: 80 year old female with a history incarcerated ventral hernia containing colon and appendix, now with abscess, presumably from appendicitis EXAM: ULTRASOUND-GUIDED DRAINAGE OF ABDOMINAL WALL ABSCESS MEDICATIONS: The patient is currently admitted to the hospital and receiving intravenous antibiotics. The antibiotics were administered within an appropriate time frame prior to the initiation of the procedure. ANESTHESIA/SEDATION: Fentanyl 100 mcg IV; Versed 1.0 mg IV Moderate Sedation Time:  12 minutes The patient was continuously monitored during the procedure by the interventional radiology nurse under my direct supervision. COMPLICATIONS: None PROCEDURE: Informed written consent was obtained from the patient after a thorough discussion of the procedural risks, benefits and alternatives. All questions were addressed. Maximal Sterile Barrier  Technique was utilized including caps, mask, sterile gowns, sterile gloves, sterile drape, hand hygiene and skin antiseptic. A timeout was performed prior to the initiation of the procedure. Patient positioned supine position on the ultrasound gantry. Scout images were acquired for planning purposes. The patient is prepped and draped in the usual sterile fashion. 1% lidocaine was used for local anesthesia. Once the patient was anesthetized, small stab incision was made with 11 blade scalpel. Trocar technique was used to place a 12 [place] drain into the abscess with complex fluid in the ventral wall. The 12 French catheter was advanced from the trocar and formed in the abscess. Approximately 150 cc of foul-smelling dark fluid aspirated. Culture was sent. Catheter was sutured in position and a final image was stored. Catheter was attached to bulb suction. Patient  tolerated the procedure well and remained hemodynamically stable throughout. No complications were encountered and no significant blood loss. IMPRESSION: Status post ultrasound-guided drainage of ventral wall abscess with sample sent for culture. Signed, Dulcy Fanny. Dellia Nims, RPVI [facility] Rad[facility] Electronically Signed   By: Corrie Mckusick D.O.   On: 08/09/2020 16:27    Assessment & Plan:   Merida was seen today for anemia and copd.  Diagnoses and all orders for this visit:  Iron deficiency anemia due to chronic blood loss- Her H/H have improved. -     CBC with Differential/Platelet; Future -     Iron; Future -     Ferritin; Future  CHF (congestive heart failure), NYHA class III, chronic, combined ([place])- Her volume status is normal. -     isosorbide-hydrALAZINE (BIDIL) 20-37.5 MG tablet; Take 1 tablet by mouth 3 (three) times daily.  Vitamin B12 deficiency anemia due to intrinsic factor deficiency -     CBC with Differential/Platelet; Future -     Folate; Future  Mucopurulent chronic bronchitis ([place])- I have asked to use a LAMA/LABA/ICS inhaler -     Fluticasone-Umeclidin-Vilant (TRELEGY ELLIPTA) 100-62.5-25 MCG/INH AEPB; Inhale 1 puff into the lungs daily.  Chronic venous stasis dermatitis of both lower extremities -     Dietary Management Product (VASCULERA) TABS; Take 1 capsule by mouth daily.  I have discontinued [place]. I am also having her start on Trelegy Ellipta and Vasculera. Additionally, I am having her maintain her albuterol, acetaminophen, ferrous sulfate, metoprolol tartrate, torsemide, allopurinol, promethazine, and isosorbide-hydrALAZINE. We administered cyanocobalamin. We will continue to administer cyanocobalamin.  Meds ordered this encounter  Medications   isosorbide-hydrALAZINE (BIDIL) 20-37.5 MG tablet    Sig: Take 1 tablet by mouth 3 (three) times daily.    Dispense:  270 tablet     Refill:  1   Fluticasone-Umeclidin-Vilant (TRELEGY ELLIPTA) 100-62.5-25 MCG/INH AEPB    Sig: Inhale 1 puff into the lungs daily.    Dispense:  120 each    Refill:  1   Dietary Management Product (VASCULERA) TABS    Sig: Take 1 capsule by mouth daily.    Dispense:  90 tablet    Refill:  1     Follow-up: Return in about 3 months (around 05/03/2021).  [name], MD

## 2021-01-31 NOTE — Patient Instructions (Signed)
Iron Deficiency Anemia, Adult Iron deficiency anemia is a condition in which the concentration of red blood cells or hemoglobin in the blood is below normal because of too little iron. Hemoglobin is a substance in red blood cells that carries oxygen to the body's tissues. When the concentration of red blood cells or hemoglobin is too low, not enough oxygen reaches these tissues. Iron deficiency anemia is usually long-lasting, and it develops over time. It may or may not cause symptoms. It is a common type of anemia. What are the causes? This condition may be caused by: Not enough iron in the diet. Abnormal absorption in the gut. Increased need for iron because of pregnancy or heavy menstrual periods, for females. Cancers of the gastrointestinal system, such as colon cancer. Blood loss caused by bleeding in the intestine. This may be from a gastrointestinal condition like Crohn's disease. Frequent blood draws, such as from blood donation. What increases the risk? The following factors may make you more likely to develop this condition: Being pregnant. Being a teenage girl going through a growth spurt. What are the signs or symptoms? Symptoms of this condition may include: Pale skin, lips, and nail beds. Weakness, dizziness, and getting tired easily. Headache. Shortness of breath when moving or exercising. Cold hands and feet. Fast or irregular heartbeat. Irritability or rapid breathing. These are more common in severe anemia. Mild anemia may not cause any symptoms. How is this diagnosed? This condition is diagnosed based on: Your medical history. A physical exam. Blood tests. You may have additional tests to find the underlying cause of your anemia, such as: Testing for blood in the stool (fecal occult blood test). A procedure to see inside your colon and rectum (colonoscopy). A procedure to see inside your esophagus and stomach (endoscopy). A test in which cells are removed from  bone marrow (bone marrow aspiration) or fluid is removed from the bone marrow to be examined. This is rarely needed. How is this treated? This condition is treated by correcting the cause of your iron deficiency. Treatment may involve: Adding iron-rich foods to your diet. Taking iron supplements. If you are pregnant or breastfeeding, you may need to take extra iron because your normal diet usually does not provide the amount of iron that you need. Increasing vitamin C intake. Vitamin C helps your body absorb iron. Your health care provider may recommend that you take iron supplements along with a glass of orange juice or a vitamin C supplement. Medicines to make heavy menstrual flow lighter. Surgery. You may need repeat blood tests to determine whether treatment is working. If the treatment does not seem to be working, you may need more tests. Follow these instructions at home: Medicines Take over-the-counter and prescription medicines only as told by your health care provider. This includes iron supplements and vitamins. For the best iron absorption, you should take iron supplements when your stomach is empty. If you cannot tolerate them on an empty stomach, you may need to take them with food. Do not drink milk or take antacids at the same time as your iron supplements. Milk and antacids may interfere with iron absorption. Iron supplements may turn stool (feces) a darker color and it may appear black. If you cannot tolerate taking iron supplements by mouth, talk with your health care provider about taking them through an IV or through an injection into a muscle. Eating and drinking  Talk with your health care provider before changing your diet. He or she may recommend   that you eat foods that contain a lot of iron, such as: Liver. Low-fat (lean) beef. Breads and cereals that have iron added to them (are fortified). Eggs. Dried fruit. Dark green, leafy vegetables. To help your body use the  iron from iron-rich foods, eat those foods at the same time as fresh fruits and vegetables that are high in vitamin C. Foods that are high in vitamin C include: Oranges. Peppers. Tomatoes. Mangoes. Drink enough fluid to keep your urine pale yellow. Managing constipation If you are taking an iron supplement, it may cause constipation. To prevent or treat constipation, you may need to: Take over-the-counter or prescription medicines. Eat foods that are high in fiber, such as beans, whole grains, and fresh fruits and vegetables. Limit foods that are high in fat and processed sugars, such as fried or sweet foods. General instructions Return to your normal activities as told by your health care provider. Ask your health care provider what activities are safe for you. Practice good hygiene. Anemia can make you more prone to illness and infection. Keep all follow-up visits as told by your health care provider. This is important. Contact a health care provider if you: Feel nauseous or you vomit. Feel weak. Have unexplained sweating. Develop symptoms of constipation, such as: Having fewer than three bowel movements a week. Straining to have a bowel movement. Having stools that are hard, dry, or larger than normal. Feeling full or bloated. Pain in the lower abdomen. Not feeling relief after having a bowel movement. Get help right away if you: Faint. If this happens, do not drive yourself to the hospital. Have chest pain. Have shortness of breath that: Is severe. Gets worse with physical activity. Have an irregular or rapid heartbeat. Become light-headed when getting up from a sitting or lying down position. These symptoms may represent a serious problem that is an emergency. Do not wait to see if the symptoms will go away. Get medical help right away. Call your local emergency services (911 in the [place] Do not drive yourself to the hospital. Summary Iron deficiency anemia is a condition in  which the concentration of red blood cells or hemoglobin in the blood is below normal because of too little iron. This condition is treated by correcting the cause of your iron deficiency. Take over-the-counter and prescription medicines only as told by your health care provider. This includes iron supplements and vitamins. To help your body use the iron from iron-rich foods, eat those foods at the same time as fresh fruits and vegetables that are high in vitamin C. Get help right away if you have shortness of breath that gets worse with physical activity. This information is not intended to replace advice given to you by your health care provider. Make sure you discuss any questions you have with your health care provider. Document Revised: 03/24/2019 Document Reviewed: 03/24/2019 Elsevier Patient Education  2022 [employer]  

## 2021-02-01 ENCOUNTER — Telehealth: Payer: Self-pay | Admitting: Internal Medicine

## 2021-02-01 ENCOUNTER — Encounter (HOSPITAL_COMMUNITY): Payer: Self-pay | Admitting: *Deleted

## 2021-02-01 ENCOUNTER — Telehealth (HOSPITAL_COMMUNITY): Payer: Self-pay | Admitting: *Deleted

## 2021-02-01 NOTE — Telephone Encounter (Signed)
Answered all questions about upcoming stress test.

## 2021-02-01 NOTE — Telephone Encounter (Signed)
Follow up:   Patient returning a call back concering instrkuctions

## 2021-02-01 NOTE — Telephone Encounter (Signed)
Left message on voicemail per DPR in reference to upcoming appointment scheduled on 02/08/21 at 1000 with detailed instructions given per Myocardial Perfusion Study Information Sheet for the test. LM to arrive 15 minutes early, and that it is imperative to arrive on time for appointment to keep from having the test rescheduled. If you need to cancel or reschedule your appointment, please call the office within 24 hours of your appointment. Failure to do so may result in a cancellation of your appointment, and a $50 no show fee. Phone number given for call back for any questions. Mychart letter sent with instructions.

## 2021-02-08 ENCOUNTER — Encounter (HOSPITAL_COMMUNITY): Payer: Medicare Other

## 2021-02-24 ENCOUNTER — Telehealth: Payer: Self-pay | Admitting: Internal Medicine

## 2021-02-24 ENCOUNTER — Telehealth: Payer: Self-pay | Admitting: *Deleted

## 2021-02-24 MED ORDER — MOLNUPIRAVIR 200 MG PO CAPS: 800.0000 mg | ORAL_CAPSULE | Freq: Two times a day (BID) | ORAL | 0 refills | Status: AC

## 2021-02-24 NOTE — Telephone Encounter (Signed)
I spoke with patient and reviewed results with her.  Patient reports she is not going to reschedule stress test and does not think she is going to proceed with surgery. Patient aware of appointment with Oda Kilts, PA on August 11 but is asking if she needs to keep this appointment.  I told her to plan on keeping this appointment for now but I would check with Tommye Standard, PA if appointment needed.

## 2021-02-24 NOTE — Telephone Encounter (Signed)
Patient calling to report home covid test was positive 7/29  Cough, stuffy nose   Seeking advice

## 2021-02-24 NOTE — Telephone Encounter (Signed)
Per DPR it is OK to leave message on voicemail.  Left message that patient should keep upcoming appointment on August 11.  Left message to call if questions.

## 2021-02-24 NOTE — Telephone Encounter (Signed)
-----   Message from Laurie Booze, MD sent at 02/23/2021 12:49 PM EDT ----- No AFib; brief SVT. Contoinue metoprolol. Joseph Art may have already discussed result with patient.

## 2021-02-24 NOTE — Telephone Encounter (Signed)
Renee Dyane Dustman, PA-C  02/23/2021  1:39 PM EDT      I sent you a staff message or CCd chart on her.  Monitor looks OK, no changes. She cancelled her stress test I saw. I have asked [place] to move up her appt with Jonni Sanger or make follow up with Dr. Lillia Pauls to follow up on this and her pending surgical clearance.  If youcan follow up on that

## 2021-02-24 NOTE — Addendum Note (Signed)
Addended by: Binnie Rail on: 02/24/2021 04:54 PM   Modules accepted: Orders

## 2021-02-24 NOTE — Telephone Encounter (Signed)
sent 

## 2021-02-24 NOTE — Telephone Encounter (Signed)
Advise otc meds for cold symptoms.  Does she want to have an anti-viral medication prescribed?

## 2021-03-07 DIAGNOSIS — Z933 Colostomy status: Secondary | ICD-10-CM | POA: Diagnosis not present

## 2021-03-08 ENCOUNTER — Telehealth: Payer: Self-pay | Admitting: Pharmacist

## 2021-03-08 NOTE — Progress Notes (Addendum)
Chronic Care Management Pharmacy Assistant   Name: [name]  MRN: [id number] DOB: 01/16/1941   Reason for Encounter: Disease State   Conditions to be addressed/monitored: HTN   Recent office visits:  01/31/21 Dr. [name] (PCP)  Reason:Iron deficiency anemia due to chronic blood loss Orders:CBC with Differential/Platelet, Iron, Folate, Ferritin Medication changes: discontinued Breo Ellipta. start on Trelegy Ellipta and Vasculera  Recent consult visits:  01/03/21 Tyson Babinski PA-C, Cardiology  Reason:Chronic diastolic congestive heart failure Orders:EKG 12-Lead, LONG TERM MONITOR-LIVE TELEMETRY, MYOCARDIAL PERFUSION IMAGING Medication changes:None noted  Hospital visits:  None in previous 6 months  Medications: Outpatient Encounter Medications as of 03/08/2021  Medication Sig   acetaminophen (TYLENOL) 325 MG tablet Take 325-650 mg by mouth daily as needed for moderate pain, fever or headache.    albuterol (PROAIR HFA) 108 (90 Base) MCG/ACT inhaler Inhale 1-2 puffs into the lungs every 6 (six) hours as needed for wheezing or shortness of breath.   allopurinol (ZYLOPRIM) 100 MG tablet TAKE 1 TABLET(100 MG) BY MOUTH DAILY   Dietary Management Product (VASCULERA) TABS Take 1 capsule by mouth daily.   ferrous sulfate 325 (65 FE) MG tablet Take 1 tablet (325 mg total) by mouth 2 (two) times daily with a meal.   Fluticasone-Umeclidin-Vilant (TRELEGY ELLIPTA) 100-62.5-25 MCG/INH AEPB Inhale 1 puff into the lungs daily.   isosorbide-hydrALAZINE (BIDIL) 20-37.5 MG tablet Take 1 tablet by mouth 3 (three) times daily.   metoprolol tartrate (LOPRESSOR) 25 MG tablet Take 1 tablet (25 mg total) by mouth 2 (two) times daily.   promethazine (PHENERGAN) 25 MG tablet Take 25 mg by mouth every 4 (four) hours as needed for nausea or vomiting.   torsemide (DEMADEX) 20 MG tablet Take 1 tablet (20 mg total) by mouth 2 (two) times daily.   Facility-Administered Encounter Medications as of  03/08/2021  Medication   cyanocobalamin ((VITAMIN B-12)) injection 1,000 mcg    Recent Office Vitals: BP Readings from Last 3 Encounters:  01/31/21 (!) 144/76  01/23/21 (!) 142/40  09/28/20 (!) 142/86   Pulse Readings from Last 3 Encounters:  01/31/21 68  01/23/21 64  09/28/20 65    Wt Readings from Last 3 Encounters:  01/31/21 181 lb (82.1 kg)  01/23/21 181 lb 12.8 oz (82.5 kg)  09/28/20 181 lb (82.1 kg)     Kidney Function Lab Results  Component Value Date/Time   CREATININE 1.34 (H) 09/28/2020 03:50 PM   CREATININE 1.38 (H) 08/19/2020 03:34 AM   CREATININE 1.36 (H) 02/04/2020 02:43 PM   CREATININE 1.1 04/11/2016 10:07 AM   CREATININE 1.5 (H) 04/05/2015 01:02 PM   GFR 37.53 (L) 09/28/2020 03:50 PM   GFRNONAA 39 (L) 08/19/2020 03:34 AM   GFRAA 30 (L) 02/19/2020 01:59 PM    BMP Latest Ref Rng & Units 09/28/2020 08/19/2020 08/18/2020  Glucose 70 - 99 mg/dL 98 114(H) 123(H)  BUN 6 - 23 mg/dL 31(H) 27(H) 31(H)  Creatinine 0.40 - 1.20 mg/dL 1.34(H) 1.38(H) 1.35(H)  BUN/Creat Ratio 6 - 22 (calc) - - -  Sodium 135 - 145 mEq/L 136 139 140  Potassium 3.5 - 5.1 mEq/L 4.6 3.5 3.5  Chloride 96 - 112 mEq/L 101 103 103  CO2 19 - 32 mEq/L 31 27 26   Calcium 8.4 - 10.5 mg/dL 9.8 8.7(L) 8.6(L)     Contacted patient on 03/08/21 to discuss hypertension disease state  Current antihypertensive regimen:  Metoprolol tartrate 25 mg 1 tab 2 times daily-last fill 11/18/20 (patient states filled  a few weeks ago) Torsemide 20 mg 1 tab 2 times daily-last filled 02/14/21 90 ds)   Patient verbally confirms she is taking the above medications as directed. Yes  How often are you checking your Blood Pressure?  Patient states that she does not check blood pressure at home> she usually gets it checked at the doctors office which reads high when she goes  Current home BP readings: Patient states [facility] 162/70 [facility]  Wrist or arm cuff: No cuff Caffeine intake:very little drinks  decaf coffee, one Zero soda added to water in the am then water thoughout day Salt intake:patient does not add salt to her food, drains canf oods and rinse off OTC medications including pseudoephedrine or NSAIDs?tylenol and iron tab daily  Any readings above 180/120? No If yes any symptoms of hypertensive emergency? patient denies any symptoms of high blood pressure   What recent interventions/DTPs have been made by any provider to improve Blood Pressure control since last CPP Visit: None noted  Any recent hospitalizations or ED visits since last visit with CPP? No  What diet changes have been made to improve Blood Pressure Control?  Patient states that she follows a low sodium diet  What exercise is being done to improve your Blood Pressure Control?  Walking around in the house is her only exercise, don't have the strength to exercise  Adherence Review: Is the patient currently on ACE/ARB medication? No Does the patient have >5 day gap between last estimated fill dates? Yes   Star Rating Drugs: None ID  Care Gaps: Last annual wellness visit:02/16/21   No appointments scheduled within the next 30 days.   [place] Pharmacist Assistant [phone]   Time spent:47

## 2021-03-08 NOTE — Progress Notes (Signed)
PCP:  [name], MD Primary Cardiologist: Larae Grooms, MD Electrophysiologist: Thompson Grayer, MD   [name] is a 80 y.o. female seen today for Thompson Grayer, MD for routine electrophysiology followup.  Since discharge from hospital the patient reports doing about the same. She had COVID 2 weeks ago. Still with lingering cough > chest wall tenderness with coughing. She continues to have intermittent gripping left chest pain. Further confounded by COVID. She states " I don't think anything is going to come of that" when asked about her colostomy reversal. She cancelled Myoview and has no plans to rescheduled.  She has worsened edema in her bilateral legs over the past couple of weeks in the setting of COVID.   Past Medical History:  Diagnosis Date   Allergy    Angiodysplasia of cecum    Angiodysplasia of duodenum    Arthritis    Asteatotic eczema 02/25/2016   Asthma    Atrial fibrillation ([place]) 10/17/2017   Blood transfusion without reported diagnosis    Cataract    CHF (congestive heart failure), NYHA class III, chronic, combined ([place]) 08/10/2017   Colonic polyp 02/16/2008   Tubular adenoma   COPD (chronic obstructive pulmonary disease) ([place]) 01/27/98   Cor pulmonale ([place]) 11/16/2014   GERD 07/30/1992   Gout    HTN (hypertension) 07/30/1988   Hyperlipidemia with target LDL less than 100 11/27/1996        Kidney stone 1960, 1972, 1991   MGUS (monoclonal gammopathy of unknown significance) 11/08/2016   Morbid obesity ([place]) 04/19/2010   She agrees to work on her lifestyle modifications to help her lose weight.    OSA (obstructive sleep apnea) 06/22/2013   Osteopenia, senile 02/05/2013   July 2014  -2.1 left femur -1.9 left forearm    Prediabetes 11/13/2006   Renal insufficiency    Stenosis of cervical spine with myelopathy (HCC) 01/01/2018   Ulcer of leg, chronic, right ([place]) 10/10/2011   ULCERATIVE COLITIS-LEFT SIDE 03/19/2008        Vitamin B12 deficiency anemia 11/13/2006         Vitamin D deficiency 02/06/2013   Past Surgical History:  Procedure Laterality Date   BRONCHOSCOPY     COLONOSCOPY     COLONOSCOPY WITH PROPOFOL N/A 02/23/2015   Procedure: COLONOSCOPY WITH PROPOFOL;  Surgeon: Ladene Artist, MD;  Location: [facility];  Service: Endoscopy;  Laterality: N/A;   COLONOSCOPY WITH PROPOFOL N/A 08/12/2018   Procedure: COLONOSCOPY WITH PROPOFOL;  Surgeon: Ladene Artist, MD;  Location: [facility];  Service: Endoscopy;  Laterality: N/A;   COLONOSCOPY WITH PROPOFOL N/A 05/09/2020   Procedure: COLONOSCOPY WITH PROPOFOL;  Surgeon: Ladene Artist, MD;  Location: [facility];  Service: Endoscopy;  Laterality: N/A;  To Splenic Flexture   ESOPHAGOGASTRODUODENOSCOPY (EGD) WITH PROPOFOL N/A 02/23/2015   Procedure: ESOPHAGOGASTRODUODENOSCOPY (EGD) WITH PROPOFOL;  Surgeon: Ladene Artist, MD;  Location: [facility];  Service: Endoscopy;  Laterality: N/A;   ESOPHAGOGASTRODUODENOSCOPY (EGD) WITH PROPOFOL N/A 08/12/2018   Procedure: ESOPHAGOGASTRODUODENOSCOPY (EGD) WITH PROPOFOL;  Surgeon: Ladene Artist, MD;  Location: [facility];  Service: Endoscopy;  Laterality: N/A;   ESOPHAGOGASTRODUODENOSCOPY (EGD) WITH PROPOFOL N/A 05/08/2020   Procedure: ESOPHAGOGASTRODUODENOSCOPY (EGD) WITH PROPOFOL;  Surgeon: Yetta Flock, MD;  Location: [facility];  Service: Gastroenterology;  Laterality: N/A;   HOT HEMOSTASIS N/A 08/12/2018   Procedure: HOT HEMOSTASIS (ARGON PLASMA COAGULATION/BICAP);  Surgeon: Ladene Artist, MD;  Location: [facility];  Service: Endoscopy;  Laterality: N/A;  EGD  and Colon APC   HOT HEMOSTASIS N/A 05/08/2020   Procedure: HOT HEMOSTASIS (ARGON PLASMA COAGULATION/BICAP);  Surgeon: Yetta Flock, MD;  Location: [facility];  Service: Gastroenterology;  Laterality: N/A;   LAPAROTOMY N/A 08/11/2020   Procedure: EXPLORATORY LAPAROTOMY PARTIAL COLECTOMY AND COLOSTOMY WITH RESECTION OF A MESSENTERIC MASS;  Surgeon: Coralie Keens, MD;   Location: [facility];  Service: General;  Laterality: N/A;   POLYPECTOMY      Current Outpatient Medications  Medication Sig Dispense Refill   acetaminophen (TYLENOL) 325 MG tablet Take 325-650 mg by mouth daily as needed for moderate pain, fever or headache.      albuterol (PROAIR HFA) 108 (90 Base) MCG/ACT inhaler Inhale 1-2 puffs into the lungs every 6 (six) hours as needed for wheezing or shortness of breath. 18 g 11   allopurinol (ZYLOPRIM) 100 MG tablet TAKE 1 TABLET(100 MG) BY MOUTH DAILY 90 tablet 1   Dietary Management Product (VASCULERA) TABS Take 1 capsule by mouth daily. 90 tablet 1   ferrous sulfate 325 (65 FE) MG tablet Take 1 tablet (325 mg total) by mouth 2 (two) times daily with a meal. 180 tablet 1   Fluticasone-Umeclidin-Vilant (TRELEGY ELLIPTA) 100-62.5-25 MCG/INH AEPB Inhale 1 puff into the lungs daily. 120 each 1   isosorbide-hydrALAZINE (BIDIL) 20-37.5 MG tablet Take 1 tablet by mouth 3 (three) times daily. 270 tablet 1   metoprolol tartrate (LOPRESSOR) 25 MG tablet Take 1 tablet (25 mg total) by mouth 2 (two) times daily. 180 tablet 1   promethazine (PHENERGAN) 25 MG tablet Take 25 mg by mouth every 4 (four) hours as needed for nausea or vomiting.     torsemide (DEMADEX) 20 MG tablet Take 1 tablet (20 mg total) by mouth 2 (two) times daily. 180 tablet 1   Current Facility-Administered Medications  Medication Dose Route Frequency Provider Last Rate Last Admin   cyanocobalamin ((VITAMIN B-12)) injection 1,000 mcg  1,000 mcg Intramuscular Q30 days [name], MD   1,000 mcg at 01/31/21 1507    Allergies  Allergen Reactions   Celebrex [Celecoxib]     edema   Amlodipine Besylate Other (See Comments)    REACTION: edema   Enalapril Cough   Lipitor [Atorvastatin]     Muscle aches   Amoxicillin Diarrhea    DID THE REACTION INVOLVE: Swelling of the face/tongue/throat, SOB, or low BP? N Sudden or severe rash/hives, skin peeling, or the inside of the mouth or nose?  N Did it require medical treatment? N When did it last happen?      MORE THAN 10 YEARS If all above answers are "NO", may proceed with cephalosporin use.    Codeine Sulfate Nausea Only   Hydrocodone-Acetaminophen Nausea Only    Social History   Socioeconomic History   Marital status: Married    Spouse name: Not on file   Number of children: 3   Years of education: Not on file   Highest education level: Not on file  Occupational History   Occupation: Surveyor, quantity: [employer]  Tobacco Use   Smoking status: Former    Types: Cigarettes    Quit date: 06/01/1996    Years since quitting: 24.7   Smokeless tobacco: Never   Tobacco comments:    Quit 1998  Vaping Use   Vaping Use: Never used  Substance and Sexual Activity   Alcohol use: No   Drug use: No   Sexual activity: Not Currently  Other Topics Concern   Not  on file  Social History Narrative   Daily Caffeine Use:  4   Regular Exercise -  NO      Social Determinants of Health   Financial Resource Strain: High Risk   Difficulty of Paying Living Expenses: Hard  Food Insecurity: Not on file  Transportation Needs: Not on file  Physical Activity: Not on file  Stress: Not on file  Social Connections: Not on file  Intimate Partner Violence: Not on file    Review of Systems: All other systems reviewed and are otherwise negative except as noted above.  Physical Exam: Vitals:   03/09/21 1139  BP: (!) 162/70  Pulse: 63  SpO2: 93%  Weight: 184 lb (83.5 kg)  Height: 5' (1.524 m)    GEN- The patient is elderly and chronically ill appearing, alert and oriented x 3 today.   HEENT: normocephalic, atraumatic; sclera clear, conjunctiva pink; hearing intact; oropharynx clear; neck supple, no JVP Lymph- no cervical lymphadenopathy Lungs- Clear to ausculation bilaterally, normal work of breathing.  No wheezes, rales, rhonchi Heart- Regular rate and rhythm, no murmurs, rubs or gallops, PMI not laterally displaced GI- Obese,  soft, non-tender, non-distended, bowel sounds present, no hepatosplenomegaly Extremities- no clubbing or cyanosis. 2+ edema with chronic venous stasis changes.  MS- no significant deformity or atrophy Skin- warm and dry, no rash or lesion Psych- euthymic mood, full affect Neuro- strength and sensation are intact  EKG is not ordered.   Additional studies reviewed include: Previous EP and gen cards office notes.   Assessment and Plan:  1. Bradycardia No significant pauses or sustained bradycardia on recent monitor.    2. HTN Stable on current regimen.    3. Paroxysmal AFib Unclear burden. No signficant episodes recent monitoring.  She is not on [place] 2/2 GI bleeds despite CHA2DS2Vasc of at least 4   4. Edema 5. Chronic CHF (diastolic) Chronic venous stasis confounds exam Volume status at least mildly elevated. Encouraged her to take extra 20 mg of torsemide as needed but no more than 1-2x a week. She refuses BMET today or by our office, so I am not comfortable adjusting her diuretics any further than that. I encouraged her to follow up with Dr. Ronnald Ramp for BMET as she states "his office always does my lab work" NYHA III-IIIb symptoms in setting of COPD as well.    6. CP Somewhat atypical though seemed to have been provoked when off her BIDIL, which is more concerning She has risk and is pending a possible surgery.  I have told her she would not be cleared for sedation by [place] without Myoview given chest pain with a questionable response to Bidil.     7. Pre-op clearance Colostomy reversal at least a moderate risk surgery She has been having some degree of CP with marked physical decline in the last 53moafter her hospitalization We have recommended Myoview. We cannot otherwise accurately classify her risk status. She has refused at this time and thus cannot be cleared. She understands our recommendation is for Myoview prior to any surgery consideration  RTC EP APP in 6 months. If edema  persists as she continues to convalesce from COVID, I have recommended she call [place] her PCP back.   MShirley Friar PA-C  03/09/21 11:48 AM

## 2021-03-09 ENCOUNTER — Ambulatory Visit: Payer: Medicare Other | Admitting: Student

## 2021-03-09 ENCOUNTER — Other Ambulatory Visit: Payer: Self-pay

## 2021-03-09 VITALS — BP 162/70 | HR 63 | Ht 60.0 in | Wt 184.0 lb

## 2021-03-09 DIAGNOSIS — I5032 Chronic diastolic (congestive) heart failure: Secondary | ICD-10-CM | POA: Diagnosis not present

## 2021-03-09 DIAGNOSIS — I480 Paroxysmal atrial fibrillation: Secondary | ICD-10-CM

## 2021-03-09 DIAGNOSIS — I495 Sick sinus syndrome: Secondary | ICD-10-CM

## 2021-03-09 DIAGNOSIS — R079 Chest pain, unspecified: Secondary | ICD-10-CM | POA: Diagnosis not present

## 2021-03-09 NOTE — Patient Instructions (Signed)
Medication Instructions:  Your physician recommends that you continue on your current medications as directed. Please refer to the Current Medication list given to you today.  *If you need a refill on your cardiac medications before your next appointment, please call your pharmacy*   Lab Work: None If you have labs (blood work) drawn today and your tests are completely normal, you will receive your results only by: [place] (if you have MyChart) OR A paper copy in the mail If you have any lab test that is abnormal or we need to change your treatment, we will call you to review the results.   Follow-Up: At [facility], you and your health needs are our priority.  As part of our continuing mission to provide you with exceptional heart care, we have created designated Provider Care Teams.  These Care Teams include your primary Cardiologist (physician) and Advanced Practice Providers (APPs -  Physician Assistants and Nurse Practitioners) who all work together to provide you with the care you need, when you need it.  We recommend signing up for the patient portal called "MyChart".  Sign up information is provided on this After Visit Summary.  MyChart is used to connect with patients for Virtual Visits (Telemedicine).  Patients are able to view lab/test results, encounter notes, upcoming appointments, etc.  Non-urgent messages can be sent to your provider as well.   To learn more about what you can do with MyChart, go to [email or web address].    Your next appointment:   6 month(s)  The format for your next appointment:   In Person  Provider:   Tommye Standard, PA-C

## 2021-03-20 ENCOUNTER — Other Ambulatory Visit: Payer: Self-pay | Admitting: Internal Medicine

## 2021-03-20 DIAGNOSIS — I5042 Chronic combined systolic (congestive) and diastolic (congestive) heart failure: Secondary | ICD-10-CM

## 2021-03-30 ENCOUNTER — Telehealth: Payer: Self-pay | Admitting: Pharmacist

## 2021-03-30 NOTE — Progress Notes (Signed)
    Chronic Care Management Pharmacy Assistant   Name: [name]  MRN: [id number] DOB: 16-Jan-1941   Reason for Encounter: Disease State   Conditions to be addressed/monitored: COPD   Recent office visits:  None ID  Recent consult visits:  03/09/21 [name], PA-C-Cardiology (Chest pain of uncertain etiology) No med changes  Hospital visits:  None in previous 6 months  Medications: Outpatient Encounter Medications as of 03/30/2021  Medication Sig   acetaminophen (TYLENOL) 325 MG tablet Take 325-650 mg by mouth daily as needed for moderate pain, fever or headache.    albuterol (PROAIR HFA) 108 (90 Base) MCG/ACT inhaler Inhale 1-2 puffs into the lungs every 6 (six) hours as needed for wheezing or shortness of breath.   allopurinol (ZYLOPRIM) 100 MG tablet TAKE 1 TABLET(100 MG) BY MOUTH DAILY   Dietary Management Product (VASCULERA) TABS Take 1 capsule by mouth daily.   ferrous sulfate 325 (65 FE) MG tablet Take 1 tablet (325 mg total) by mouth 2 (two) times daily with a meal.   Fluticasone-Umeclidin-Vilant (TRELEGY ELLIPTA) 100-62.5-25 MCG/INH AEPB Inhale 1 puff into the lungs daily.   isosorbide-hydrALAZINE (BIDIL) 20-37.5 MG tablet TAKE 1 TABLET BY MOUTH THREE TIMES DAILY   metoprolol tartrate (LOPRESSOR) 25 MG tablet Take 1 tablet (25 mg total) by mouth 2 (two) times daily.   promethazine (PHENERGAN) 25 MG tablet Take 25 mg by mouth every 4 (four) hours as needed for nausea or vomiting.   torsemide (DEMADEX) 20 MG tablet Take 1 tablet (20 mg total) by mouth 2 (two) times daily.   Facility-Administered Encounter Medications as of 03/30/2021  Medication   cyanocobalamin ((VITAMIN B-12)) injection 1,000 mcg    Contacted patient to discuss COPD disease state  Current COPD regimen:  Albuterol inhale 1-2 puffs every 6 hours, Trelegy  Any recent hospitalizations or ED visits since last visit with CPP? No  reports the following COPD symptoms, including Increased  shortness of breath . Patient states that she had covid in July and since then her breathing has gotten worse. She states that when she takes trelegy that it hurts and burns her chest and makes her uncomfortable when she breaths it in. She states thatshe called to have an appt with DR. Ronnald Ramp but has to wait until 05/03/21   What recent interventions/DTPs have been made by any provider to improve breathing since last visit: None ID   Have you had exacerbation/flare-up since last visit? No  What do you do when you are short of breath?  Rescue medicationPatient has used about four times this week  Current tobacco use? Interested in cessation? No  Respiratory Devices/Equipment Do you have a nebulizer? No, has not used one this year, med is not up to date Do you use a Peak Flow Meter? No, has one but does not use Do you use a maintenance inhaler? Yes How often do you forget to use your daily inhaler? Patient states that she does forget sometimes Do you use a rescue inhaler? Yes How often do you use your rescue inhaler?  3-5x times per week Do you use a spacer with your inhaler? No  Adherence Review: Does the patient have >5 day gap between last estimated fill date for maintenance inhaler medications? NO  CCM appointment on 05/03/21  Care Gaps: Last annual wellness visit:02/16/21  Star Rated Drugs: None ID  [name] Clinical Pharmacist Assistant [phone]   Time spent:30

## 2021-04-13 ENCOUNTER — Other Ambulatory Visit: Payer: Self-pay | Admitting: Internal Medicine

## 2021-04-13 DIAGNOSIS — J432 Centrilobular emphysema: Secondary | ICD-10-CM

## 2021-04-13 MED ORDER — BREZTRI AEROSPHERE 160-9-4.8 MCG/ACT IN AERO: 2.0000 | INHALATION_SPRAY | Freq: Two times a day (BID) | RESPIRATORY_TRACT | 1 refills | Status: DC

## 2021-04-13 NOTE — Telephone Encounter (Signed)
Patient reports Trelegy is causing burning in her chest and coughing whenever she uses it. Contacted her to discuss - the dry powder may be irritating to her mucosa. She reports she does fine with her albuterol inhaler which is an MDI.   Discussed it may be helpful to switch Trelegy to [facility] since it is not a powder inhaler. Pt does not want to change anything without discussing with PCP - she has appt 10/5.

## 2021-04-21 DIAGNOSIS — Z933 Colostomy status: Secondary | ICD-10-CM | POA: Diagnosis not present

## 2021-04-24 DIAGNOSIS — L03119 Cellulitis of unspecified part of limb: Secondary | ICD-10-CM | POA: Diagnosis not present

## 2021-04-24 DIAGNOSIS — J449 Chronic obstructive pulmonary disease, unspecified: Secondary | ICD-10-CM | POA: Diagnosis not present

## 2021-04-25 ENCOUNTER — Ambulatory Visit: Payer: Medicare Other | Admitting: Student

## 2021-04-29 DIAGNOSIS — C801 Malignant (primary) neoplasm, unspecified: Secondary | ICD-10-CM

## 2021-04-29 HISTORY — DX: Malignant (primary) neoplasm, unspecified: C80.1

## 2021-05-02 ENCOUNTER — Other Ambulatory Visit: Payer: Self-pay

## 2021-05-03 ENCOUNTER — Ambulatory Visit (INDEPENDENT_AMBULATORY_CARE_PROVIDER_SITE_OTHER): Payer: Medicare Other | Admitting: Internal Medicine

## 2021-05-03 ENCOUNTER — Other Ambulatory Visit: Payer: Self-pay

## 2021-05-03 ENCOUNTER — Ambulatory Visit (INDEPENDENT_AMBULATORY_CARE_PROVIDER_SITE_OTHER): Payer: Medicare Other

## 2021-05-03 ENCOUNTER — Encounter: Payer: Self-pay | Admitting: Internal Medicine

## 2021-05-03 VITALS — BP 166/76 | HR 83 | Temp 98.3°F | Resp 20 | Ht 60.0 in | Wt 180.0 lb

## 2021-05-03 DIAGNOSIS — R052 Subacute cough: Secondary | ICD-10-CM | POA: Insufficient documentation

## 2021-05-03 DIAGNOSIS — N1832 Chronic kidney disease, stage 3b: Secondary | ICD-10-CM

## 2021-05-03 DIAGNOSIS — R059 Cough, unspecified: Secondary | ICD-10-CM | POA: Diagnosis not present

## 2021-05-03 DIAGNOSIS — I10 Essential (primary) hypertension: Secondary | ICD-10-CM

## 2021-05-03 DIAGNOSIS — R0989 Other specified symptoms and signs involving the circulatory and respiratory systems: Secondary | ICD-10-CM | POA: Diagnosis not present

## 2021-05-03 DIAGNOSIS — J90 Pleural effusion, not elsewhere classified: Secondary | ICD-10-CM | POA: Diagnosis not present

## 2021-05-03 DIAGNOSIS — D500 Iron deficiency anemia secondary to blood loss (chronic): Secondary | ICD-10-CM

## 2021-05-03 DIAGNOSIS — D510 Vitamin B12 deficiency anemia due to intrinsic factor deficiency: Secondary | ICD-10-CM

## 2021-05-03 NOTE — Progress Notes (Signed)
Subjective:  Patient ID: [name], female    DOB: 01-03-1941  Age: 80 y.o. MRN: [id number]  CC: Anemia, Hypertension, and Cough  This visit occurred during the SARS-CoV-2 public health emergency.  Safety protocols were in place, including screening questions prior to the visit, additional usage of staff PPE, and extensive cleaning of exam room while observing appropriate contact time as indicated for disinfecting solutions.    HPI [name] presents for f/up -   She complains of a 1 month history of worsening nonproductive cough, increasing work of breathing, left posterior pleuritic chest pain, and shortness of breath.  She says none of her medications are helping with this.  Outpatient Medications Prior to Visit  Medication Sig Dispense Refill   acetaminophen (TYLENOL) 325 MG tablet Take 325-650 mg by mouth daily as needed for moderate pain, fever or headache.      albuterol (PROAIR HFA) 108 (90 Base) MCG/ACT inhaler Inhale 1-2 puffs into the lungs every 6 (six) hours as needed for wheezing or shortness of breath. 18 g 11   allopurinol (ZYLOPRIM) 100 MG tablet TAKE 1 TABLET(100 MG) BY MOUTH DAILY 90 tablet 1   Budeson-Glycopyrrol-Formoterol (BREZTRI AEROSPHERE) 160-9-4.8 MCG/ACT AERO Inhale 2 puffs into the lungs 2 (two) times daily. 32.1 g 1   Dietary Management Product (VASCULERA) TABS Take 1 capsule by mouth daily. 90 tablet 1   ferrous sulfate 325 (65 FE) MG tablet Take 1 tablet (325 mg total) by mouth 2 (two) times daily with a meal. 180 tablet 1   Fluticasone-Umeclidin-Vilant (TRELEGY ELLIPTA) 100-62.5-25 MCG/INH AEPB Inhale 1 puff into the lungs daily. 120 each 1   isosorbide-hydrALAZINE (BIDIL) 20-37.5 MG tablet TAKE 1 TABLET BY MOUTH THREE TIMES DAILY 270 tablet 1   metoprolol tartrate (LOPRESSOR) 25 MG tablet Take 1 tablet (25 mg total) by mouth 2 (two) times daily. 180 tablet 1   promethazine (PHENERGAN) 25 MG tablet Take 25 mg by mouth every 4 (four) hours as  needed for nausea or vomiting.     torsemide (DEMADEX) 20 MG tablet Take 1 tablet (20 mg total) by mouth 2 (two) times daily. 180 tablet 1   Facility-Administered Medications Prior to Visit  Medication Dose Route Frequency Provider Last Rate Last Admin   cyanocobalamin ((VITAMIN B-12)) injection 1,000 mcg  1,000 mcg Intramuscular Q30 days [name], MD   1,000 mcg at 01/31/21 1507    ROS Review of Systems  Constitutional:  Negative for chills, diaphoresis, fatigue and fever.  HENT: Negative.    Eyes: Negative.   Respiratory:  Positive for cough and shortness of breath. Negative for chest tightness and wheezing.   Cardiovascular:  Positive for chest pain. Negative for palpitations and leg swelling.  Gastrointestinal:  Negative for abdominal pain, constipation, diarrhea, nausea and vomiting.  Endocrine: Negative.   Genitourinary: Negative.  Negative for difficulty urinating.  Musculoskeletal: Negative.   Skin: Negative.   Neurological:  Negative for dizziness, weakness and light-headedness.  Hematological:  Negative for adenopathy. Does not bruise/bleed easily.  Psychiatric/Behavioral: Negative.     Objective:  BP (!) 166/76 (BP Location: Left Arm, Patient Position: Sitting, Cuff Size: Large)   Pulse 83   Temp 98.3 F (36.8 C) (Oral)   Resp 20   Ht 5' (1.524 m)   Wt 180 lb (81.6 kg)   SpO2 95%   BMI 35.15 kg/m   BP Readings from Last 3 Encounters:  05/03/21 (!) 166/76  03/09/21 (!) 162/70  01/31/21 (!) 144/76  Wt Readings from Last 3 Encounters:  05/03/21 180 lb (81.6 kg)  03/09/21 184 lb (83.5 kg)  01/31/21 181 lb (82.1 kg)    Physical Exam Vitals reviewed.  Constitutional:      General: She is not in acute distress.    Appearance: She is ill-appearing. She is not toxic-appearing or diaphoretic.  HENT:     Nose: Nose normal.     Mouth/Throat:     Mouth: Mucous membranes are moist.  Eyes:     Conjunctiva/sclera: Conjunctivae normal.  Cardiovascular:      Rate and Rhythm: Normal rate and regular rhythm.  Pulmonary:     Effort: No tachypnea, accessory muscle usage or respiratory distress.     Breath sounds: No stridor. Examination of the left-middle field reveals decreased breath sounds. Examination of the left-lower field reveals decreased breath sounds. Decreased breath sounds present. No wheezing, rhonchi or rales.  Abdominal:     General: Abdomen is protuberant. Bowel sounds are normal. There is no distension.     Palpations: Abdomen is soft. There is no hepatomegaly or mass.  Musculoskeletal:        General: Normal range of motion.     Cervical back: Neck supple.     Right lower leg: No edema.     Left lower leg: No edema.  Lymphadenopathy:     Cervical: No cervical adenopathy.  Skin:    General: Skin is warm and dry.  Neurological:     General: No focal deficit present.     Mental Status: She is alert.  Psychiatric:        Mood and Affect: Mood normal.        Behavior: Behavior normal.    Lab Results  Component Value Date   WBC 8.9 01/31/2021   HGB 9.6 (L) 01/31/2021   HCT 29.1 (L) 01/31/2021   PLT 222.0 01/31/2021   GLUCOSE 98 09/28/2020   CHOL 165 02/04/2020   TRIG 162 (H) 02/04/2020   HDL 49 (L) 02/04/2020   LDLDIRECT 107.0 09/04/2016   LDLCALC 90 02/04/2020   ALT 11 08/13/2020   AST 14 (L) 08/13/2020   NA 136 09/28/2020   K 4.6 09/28/2020   CL 101 09/28/2020   CREATININE 1.34 (H) 09/28/2020   BUN 31 (H) 09/28/2020   CO2 31 09/28/2020   TSH 2.35 07/02/2019   INR 1.2 08/13/2020   HGBA1C 5.4 11/05/2017   MICROALBUR <0.7 11/05/2017    CT ABDOMEN PELVIS W CONTRAST  Result Date: 08/09/2020 CLINICAL DATA:  Persistent abdominal pain from known hernia x3 weeks. EXAM: CT ABDOMEN AND PELVIS WITH CONTRAST TECHNIQUE: Multidetector CT imaging of the abdomen and pelvis was performed using the standard protocol following bolus administration of intravenous contrast. CONTRAST:  49m OMNIPAQUE IOHEXOL 300 MG/ML  SOLN  COMPARISON:  February 08, 2020 FINDINGS: Lower chest: There is atelectasis at the lung bases. There is a fat containing Bochdalek hernia on the right.The heart size is normal. Hepatobiliary: The liver is normal. Normal gallbladder.There is no biliary ductal dilation. Pancreas: Normal contours without ductal dilatation. No peripancreatic fluid collection. Spleen: There is splenomegaly with the spleen measuring approximately 13 cm craniocaudad. Adrenals/Urinary Tract: --Adrenal glands: Unremarkable. --Right kidney/ureter: There is a solid exophytic mass arising from the interpolar region of the right kidney measuring 3.5 cm (axial series 2, image 30). Multiple cysts are noted throughout the right kidney. There is no right-sided hydronephrosis. --Left kidney/ureter: Multiple cysts are noted. --Urinary bladder: Unremarkable. Stomach/Bowel: --Stomach/Duodenum: No hiatal hernia or  other gastric abnormality. Normal duodenal course and caliber. --Small bowel: Unremarkable. --Colon: There is a moderate amount of stool at the level of the rectum. There is scattered colonic diverticula without CT evidence for diverticulitis. Again noted is a large ventral wall hernia containing a loop of the transverse colon in addition to portions of the patient's appendix. There is now a large air and fluid collection in the hernia sac measuring approximately 9.4 x 7.2 cm. There is significant adjacent fat stranding and overlying skin thickening. --Appendix: The visualized portions of the intraperitoneal appendix are unremarkable. The portions that are herniated are difficult to evaluate. Vascular/Lymphatic: Atherosclerotic calcification is present within the non-aneurysmal abdominal aorta, without hemodynamically significant stenosis. --there are few prominent, nonspecific retroperitoneal lymph nodes. --No mesenteric lymphadenopathy. --No pelvic or inguinal lymphadenopathy. Reproductive: Unremarkable Other: Again noted is a large ventral wall  hernia containing portions of the transverse colon as and a large volume of peritoneal fat. Musculoskeletal. There is diffuse osteopenia which limits detection of nondisplaced fractures. There is no definite acute displaced fracture identified on today's study. IMPRESSION: 1. Large ventral wall hernia containing a loop of the transverse colon. While there is no evidence for an obstruction, there is a new large air and fluid collection within the hernia sac concerning for perforation of the transverse colon with resultant abscess formation or complicated diverticulitis. Alternatively, a portion of the appendix is located within the hernia sac. As such, the differential includes complicated appendicitis, although this is felt to be less likely. 2. There is a 3.5 cm right renal mass consistent with renal cell carcinoma until proven otherwise. 3. Splenomegaly. 4. Additional chronic findings as detailed above. Aortic Atherosclerosis (ICD10-I70.0). These results were called by telephone at the time of interpretation on 08/09/2020 at 2:57 am to provider DAN FLOYD , who verbally acknowledged these results. Electronically Signed   By: Constance Holster M.D.   On: 08/09/2020 03:00   DG CHEST PORT 1 VIEW  Result Date: 08/09/2020 CLINICAL DATA:  80 year old female with history of COVID infection with increasing shortness of breath. EXAM: PORTABLE CHEST 1 VIEW COMPARISON:  Chest x-ray 01/01/2018. FINDINGS: Ill-defined opacities and areas of mild interstitial prominence are noted throughout the mid to lower lungs bilaterally. Small left pleural effusion or chronic pleuroparenchymal scarring, stable compared to remote prior study from 2019. No pneumothorax. No evidence of pulmonary edema. Heart size is mildly enlarged. Upper mediastinal contours are within normal limits. Aortic atherosclerosis. IMPRESSION: 1. There are subtle findings in the mid to lower lungs which suggest developing multilobar bilateral pneumonia, compatible  with reported COVID infection. 2. Mild cardiomegaly. 3. Aortic atherosclerosis. Electronically Signed   By: Vinnie Langton M.D.   On: 08/09/2020 09:14   [place] IMAGE GUIDED FLUID DRAIN BY CATHETER  Result Date: 08/09/2020 INDICATION: 80 year old female with a history incarcerated ventral hernia containing colon and appendix, now with abscess, presumably from appendicitis EXAM: ULTRASOUND-GUIDED DRAINAGE OF ABDOMINAL WALL ABSCESS MEDICATIONS: The patient is currently admitted to the hospital and receiving intravenous antibiotics. The antibiotics were administered within an appropriate time frame prior to the initiation of the procedure. ANESTHESIA/SEDATION: Fentanyl 100 mcg IV; Versed 1.0 mg IV Moderate Sedation Time:  12 minutes The patient was continuously monitored during the procedure by the interventional radiology nurse under my direct supervision. COMPLICATIONS: None PROCEDURE: Informed written consent was obtained from the patient after a thorough discussion of the procedural risks, benefits and alternatives. All questions were addressed. Maximal Sterile Barrier Technique was utilized including caps, mask, sterile gowns,  sterile gloves, sterile drape, hand hygiene and skin antiseptic. A timeout was performed prior to the initiation of the procedure. Patient positioned supine position on the ultrasound gantry. Scout images were acquired for planning purposes. The patient is prepped and draped in the usual sterile fashion. 1% lidocaine was used for local anesthesia. Once the patient was anesthetized, small stab incision was made with 11 blade scalpel. Trocar technique was used to place a 12 [place] drain into the abscess with complex fluid in the ventral wall. The 12 French catheter was advanced from the trocar and formed in the abscess. Approximately 150 cc of foul-smelling dark fluid aspirated. Culture was sent. Catheter was sutured in position and a final image was stored. Catheter was attached to bulb  suction. Patient tolerated the procedure well and remained hemodynamically stable throughout. No complications were encountered and no significant blood loss. IMPRESSION: Status post ultrasound-guided drainage of ventral wall abscess with sample sent for culture. Signed, Dulcy Fanny. Dellia Nims, RPVI [facility] Radiology Electronically Signed   By: Corrie Mckusick D.O.   On: 08/09/2020 16:27    DG Chest 2 View  Result Date: 05/03/2021 CLINICAL DATA:  Cough with decreased breath sounds at the left lung base. Symptoms for 2-3 weeks. EXAM: CHEST - 2 VIEW COMPARISON:  Radiographs 08/12/2020.  CT 05/20/2018. FINDINGS: A previously demonstrated left pleural effusion has enlarged, and there is increasing opacity at the left lung base. The right lung appears clear. There is no significant right-sided pleural effusion or pneumothorax. The heart appears enlarged. Aortic atherosclerosis noted. There are stable degenerative changes in the spine. IMPRESSION: Enlarged moderate-sized left pleural effusion with left lower lobe consolidation or mass. If not previously performed, consider thoracentesis for further evaluation. Follow-up necessary to exclude underlying malignancy. Electronically Signed   By: Richardean Sale M.D.   On: 05/03/2021 16:09     Assessment & Plan:   Jo was seen today for anemia, hypertension and cough.  Diagnoses and all orders for this visit:  Primary hypertension -     Basic metabolic panel; Future  Chronic kidney disease, stage 3b ([place]) -     Basic metabolic panel; Future  Iron deficiency anemia due to chronic blood loss -     CBC with Differential/Platelet; Future -     IBC + Ferritin; Future  Vitamin B12 deficiency anemia due to intrinsic factor deficiency -     CBC with Differential/Platelet; Future  Subacute cough- See below. -     DG Chest 2 View; Future  Pleural effusion, left- She has a moderate sized left pleural effusion  with consolidation and/or mass.  She has worsening respiratory symptoms.  I recommended that she present to the ED tonight to consider undergoing emergent thoracentesis.  I am having [name] maintain her albuterol, acetaminophen, ferrous sulfate, metoprolol tartrate, torsemide, allopurinol, promethazine, Trelegy Ellipta, Vasculera, isosorbide-hydrALAZINE, and Breztri Aerosphere. We will continue to administer cyanocobalamin.  No orders of the defined types were placed in this encounter.    Follow-up: No follow-ups on file.  [name], MD

## 2021-05-04 ENCOUNTER — Encounter (HOSPITAL_COMMUNITY): Payer: Self-pay | Admitting: *Deleted

## 2021-05-04 ENCOUNTER — Observation Stay (HOSPITAL_COMMUNITY): Payer: Medicare Other

## 2021-05-04 ENCOUNTER — Inpatient Hospital Stay (HOSPITAL_COMMUNITY)
Admission: EM | Admit: 2021-05-04 | Discharge: 2021-05-09 | DRG: 291 | Disposition: A | Discharge status: Home Health | Payer: Medicare Other | Attending: Internal Medicine | Admitting: Internal Medicine

## 2021-05-04 DIAGNOSIS — I130 Hypertensive heart and chronic kidney disease with heart failure and stage 1 through stage 4 chronic kidney disease, or unspecified chronic kidney disease: Principal | ICD-10-CM | POA: Diagnosis present

## 2021-05-04 DIAGNOSIS — J449 Chronic obstructive pulmonary disease, unspecified: Secondary | ICD-10-CM | POA: Diagnosis not present

## 2021-05-04 DIAGNOSIS — Z87891 Personal history of nicotine dependence: Secondary | ICD-10-CM | POA: Diagnosis not present

## 2021-05-04 DIAGNOSIS — M1030 Gout due to renal impairment, unspecified site: Secondary | ICD-10-CM | POA: Diagnosis not present

## 2021-05-04 DIAGNOSIS — Z20822 Contact with and (suspected) exposure to covid-19: Secondary | ICD-10-CM | POA: Diagnosis present

## 2021-05-04 DIAGNOSIS — M8588 Other specified disorders of bone density and structure, other site: Secondary | ICD-10-CM | POA: Diagnosis present

## 2021-05-04 DIAGNOSIS — G4733 Obstructive sleep apnea (adult) (pediatric): Secondary | ICD-10-CM | POA: Diagnosis present

## 2021-05-04 DIAGNOSIS — J9611 Chronic respiratory failure with hypoxia: Secondary | ICD-10-CM | POA: Diagnosis not present

## 2021-05-04 DIAGNOSIS — C3491 Malignant neoplasm of unspecified part of right bronchus or lung: Secondary | ICD-10-CM | POA: Diagnosis not present

## 2021-05-04 DIAGNOSIS — E669 Obesity, unspecified: Secondary | ICD-10-CM | POA: Diagnosis present

## 2021-05-04 DIAGNOSIS — I5032 Chronic diastolic (congestive) heart failure: Secondary | ICD-10-CM | POA: Diagnosis not present

## 2021-05-04 DIAGNOSIS — E538 Deficiency of other specified B group vitamins: Secondary | ICD-10-CM | POA: Diagnosis present

## 2021-05-04 DIAGNOSIS — Z8719 Personal history of other diseases of the digestive system: Secondary | ICD-10-CM

## 2021-05-04 DIAGNOSIS — I2781 Cor pulmonale (chronic): Secondary | ICD-10-CM | POA: Diagnosis not present

## 2021-05-04 DIAGNOSIS — Z881 Allergy status to other antibiotic agents status: Secondary | ICD-10-CM

## 2021-05-04 DIAGNOSIS — J90 Pleural effusion, not elsewhere classified: Secondary | ICD-10-CM | POA: Diagnosis not present

## 2021-05-04 DIAGNOSIS — N179 Acute kidney failure, unspecified: Secondary | ICD-10-CM | POA: Diagnosis present

## 2021-05-04 DIAGNOSIS — N1832 Chronic kidney disease, stage 3b: Secondary | ICD-10-CM | POA: Diagnosis not present

## 2021-05-04 DIAGNOSIS — D649 Anemia, unspecified: Secondary | ICD-10-CM | POA: Diagnosis present

## 2021-05-04 DIAGNOSIS — I10 Essential (primary) hypertension: Secondary | ICD-10-CM | POA: Diagnosis not present

## 2021-05-04 DIAGNOSIS — J939 Pneumothorax, unspecified: Secondary | ICD-10-CM

## 2021-05-04 DIAGNOSIS — Z8051 Family history of malignant neoplasm of kidney: Secondary | ICD-10-CM | POA: Diagnosis not present

## 2021-05-04 DIAGNOSIS — E559 Vitamin D deficiency, unspecified: Secondary | ICD-10-CM | POA: Diagnosis not present

## 2021-05-04 DIAGNOSIS — Z888 Allergy status to other drugs, medicaments and biological substances status: Secondary | ICD-10-CM

## 2021-05-04 DIAGNOSIS — M109 Gout, unspecified: Secondary | ICD-10-CM | POA: Diagnosis present

## 2021-05-04 DIAGNOSIS — E785 Hyperlipidemia, unspecified: Secondary | ICD-10-CM | POA: Diagnosis not present

## 2021-05-04 DIAGNOSIS — Z79890 Hormone replacement therapy (postmenopausal): Secondary | ICD-10-CM

## 2021-05-04 DIAGNOSIS — Z6835 Body mass index (BMI) 35.0-35.9, adult: Secondary | ICD-10-CM

## 2021-05-04 DIAGNOSIS — Z87442 Personal history of urinary calculi: Secondary | ICD-10-CM | POA: Diagnosis not present

## 2021-05-04 DIAGNOSIS — I4891 Unspecified atrial fibrillation: Secondary | ICD-10-CM | POA: Diagnosis not present

## 2021-05-04 DIAGNOSIS — J302 Other seasonal allergic rhinitis: Secondary | ICD-10-CM | POA: Diagnosis not present

## 2021-05-04 DIAGNOSIS — J9621 Acute and chronic respiratory failure with hypoxia: Secondary | ICD-10-CM | POA: Diagnosis present

## 2021-05-04 DIAGNOSIS — R0602 Shortness of breath: Secondary | ICD-10-CM

## 2021-05-04 DIAGNOSIS — Z8249 Family history of ischemic heart disease and other diseases of the circulatory system: Secondary | ICD-10-CM | POA: Diagnosis not present

## 2021-05-04 DIAGNOSIS — Z79899 Other long term (current) drug therapy: Secondary | ICD-10-CM

## 2021-05-04 DIAGNOSIS — Z885 Allergy status to narcotic agent status: Secondary | ICD-10-CM

## 2021-05-04 DIAGNOSIS — J918 Pleural effusion in other conditions classified elsewhere: Secondary | ICD-10-CM | POA: Diagnosis not present

## 2021-05-04 DIAGNOSIS — K219 Gastro-esophageal reflux disease without esophagitis: Secondary | ICD-10-CM | POA: Diagnosis not present

## 2021-05-04 DIAGNOSIS — Z9889 Other specified postprocedural states: Secondary | ICD-10-CM

## 2021-05-04 DIAGNOSIS — I517 Cardiomegaly: Secondary | ICD-10-CM | POA: Diagnosis not present

## 2021-05-04 DIAGNOSIS — I5033 Acute on chronic diastolic (congestive) heart failure: Secondary | ICD-10-CM | POA: Diagnosis present

## 2021-05-04 DIAGNOSIS — C384 Malignant neoplasm of pleura: Secondary | ICD-10-CM | POA: Diagnosis not present

## 2021-05-04 LAB — CBC WITH DIFFERENTIAL/PLATELET
Abs Immature Granulocytes: 0.02 10*3/uL (ref 0.00–0.07)
Basophils Absolute: 0 10*3/uL (ref 0.0–0.1)
Basophils Relative: 0 %
Eosinophils Absolute: 0.1 10*3/uL (ref 0.0–0.5)
Eosinophils Relative: 1 %
HCT: 34.2 % — ABNORMAL LOW (ref 36.0–46.0)
Hemoglobin: 10.6 g/dL — ABNORMAL LOW (ref 12.0–15.0)
Immature Granulocytes: 0 %
Lymphocytes Relative: 12 %
Lymphs Abs: 1.1 10*3/uL (ref 0.7–4.0)
MCH: 26.2 pg (ref 26.0–34.0)
MCHC: 31 g/dL (ref 30.0–36.0)
MCV: 84.4 fL (ref 80.0–100.0)
Monocytes Absolute: 0.8 10*3/uL (ref 0.1–1.0)
Monocytes Relative: 9 %
Neutro Abs: 7.2 10*3/uL (ref 1.7–7.7)
Neutrophils Relative %: 78 %
Platelets: 220 10*3/uL (ref 150–400)
RBC: 4.05 MIL/uL (ref 3.87–5.11)
RDW: 14.3 % (ref 11.5–15.5)
WBC: 9.3 10*3/uL (ref 4.0–10.5)
nRBC: 0 % (ref 0.0–0.2)

## 2021-05-04 LAB — BODY FLUID CELL COUNT WITH DIFFERENTIAL
Eos, Fluid: 2 %
Lymphs, Fluid: 53 %
Monocyte-Macrophage-Serous Fluid: 41 % — ABNORMAL LOW (ref 50–90)
Neutrophil Count, Fluid: 4 % (ref 0–25)
Total Nucleated Cell Count, Fluid: 898 cu mm (ref 0–1000)

## 2021-05-04 LAB — COMPREHENSIVE METABOLIC PANEL
ALT: 10 U/L (ref 0–44)
AST: 16 U/L (ref 15–41)
Albumin: 3.7 g/dL (ref 3.5–5.0)
Alkaline Phosphatase: 59 U/L (ref 38–126)
Anion gap: 5 (ref 5–15)
BUN: 31 mg/dL — ABNORMAL HIGH (ref 8–23)
CO2: 32 mmol/L (ref 22–32)
Calcium: 10 mg/dL (ref 8.9–10.3)
Chloride: 105 mmol/L (ref 98–111)
Creatinine, Ser: 1.57 mg/dL — ABNORMAL HIGH (ref 0.44–1.00)
GFR, Estimated: 33 mL/min — ABNORMAL LOW (ref >60)
Glucose, Bld: 114 mg/dL — ABNORMAL HIGH (ref 70–99)
Potassium: 4.5 mmol/L (ref 3.5–5.1)
Sodium: 142 mmol/L (ref 135–145)
Total Bilirubin: 0.5 mg/dL (ref 0.3–1.2)
Total Protein: 8 g/dL (ref 6.5–8.1)

## 2021-05-04 LAB — ALBUMIN, PLEURAL OR PERITONEAL FLUID: Albumin, Fluid: 2.9 g/dL

## 2021-05-04 LAB — RESP PANEL BY RT-PCR (FLU A&B, COVID) ARPGX2
Influenza A by PCR: NEGATIVE
Influenza B by PCR: NEGATIVE
SARS Coronavirus 2 by RT PCR: NEGATIVE

## 2021-05-04 LAB — TROPONIN I (HIGH SENSITIVITY)
Troponin I (High Sensitivity): 7 ng/L (ref <18)
Troponin I (High Sensitivity): 7 ng/L (ref <18)

## 2021-05-04 LAB — BRAIN NATRIURETIC PEPTIDE: B Natriuretic Peptide: 34.9 pg/mL (ref 0.0–100.0)

## 2021-05-04 LAB — PROTIME-INR
INR: 1.1 (ref 0.8–1.2)
Prothrombin Time: 13.7 seconds (ref 11.4–15.2)

## 2021-05-04 LAB — PROTEIN, PLEURAL OR PERITONEAL FLUID: Total protein, fluid: 5.1 g/dL

## 2021-05-04 MED ORDER — ACETAMINOPHEN 650 MG RE SUPP: 650.0000 mg | Freq: Four times a day (QID) | RECTAL | Status: DC | PRN

## 2021-05-04 MED ORDER — LIDOCAINE HCL 1 % IJ SOLN
INTRAMUSCULAR | Status: AC
  Filled 2021-05-04: qty 20

## 2021-05-04 MED ORDER — ONDANSETRON HCL 4 MG/2ML IJ SOLN
4.0000 mg | Freq: Four times a day (QID) | INTRAMUSCULAR | Status: DC | PRN
  Administered 2021-05-04 (×2): 4 mg via INTRAVENOUS
  Filled 2021-05-04 (×2): qty 2

## 2021-05-04 MED ORDER — ENOXAPARIN SODIUM 30 MG/0.3ML IJ SOSY
30.0000 mg | PREFILLED_SYRINGE | INTRAMUSCULAR | Status: DC
  Administered 2021-05-04 – 2021-05-08 (×5): 30 mg via SUBCUTANEOUS
  Filled 2021-05-04 (×5): qty 0.3

## 2021-05-04 MED ORDER — ONDANSETRON HCL 4 MG PO TABS: 4.0000 mg | ORAL_TABLET | Freq: Four times a day (QID) | ORAL | Status: DC | PRN

## 2021-05-04 MED ORDER — ACETAMINOPHEN 325 MG PO TABS
650.0000 mg | ORAL_TABLET | Freq: Four times a day (QID) | ORAL | Status: DC | PRN
  Administered 2021-05-04 – 2021-05-08 (×4): 650 mg via ORAL
  Filled 2021-05-04 (×4): qty 2

## 2021-05-04 MED ORDER — HYDROMORPHONE HCL 1 MG/ML IJ SOLN
0.7500 mg | Freq: Once | INTRAMUSCULAR | Status: AC
Start: 2021-05-04 — End: 2021-05-04
  Administered 2021-05-04: 0.75 mg via INTRAVENOUS
  Filled 2021-05-04: qty 1

## 2021-05-04 MED ORDER — HYDROMORPHONE HCL 1 MG/ML IJ SOLN
0.5000 mg | INTRAMUSCULAR | Status: DC | PRN
  Administered 2021-05-08: 0.5 mg via INTRAVENOUS
  Filled 2021-05-04: qty 0.5

## 2021-05-04 NOTE — ED Provider Notes (Signed)
Emergency Medicine Provider Triage Evaluation Note  [name] , a 80 y.o. female  was evaluated in triage.  Pt complains of shortness of breath x2 months.  Patient seen by PCP yesterday, diagnosed with large left pleural effusion and sent to the ER. Unsure if she is having fever, reports baseline temp of 96, feels like she is febrile at 98.  Review of Systems  Positive: Shortness of breath Negative: Chest pain  Physical Exam  BP (!) 188/61 (BP Location: Left Arm)   Pulse 91   Temp 98.7 F (37.1 C) (Oral)   Resp 16   SpO2 91%  Gen:   Awake, no distress   Resp: Mild increased work of breathing, lung sounds absent left mid to lower MSK:   Moves extremities without difficulty  Other:    Medical Decision Making  Medically screening exam initiated at 10:00 AM.  Appropriate orders placed.  [name] was informed that the remainder of the evaluation will be completed by another provider, this initial triage assessment does not replace that evaluation, and the importance of remaining in the ED until their evaluation is complete.  Reviewed patient's chest x-ray from yesterday with her, encouraged her to stay for further work-up.   Tacy Learn, PA-C 05/04/21 1001    Hayden Rasmussen, MD 05/04/21 [phone]

## 2021-05-04 NOTE — Procedures (Signed)
PROCEDURE SUMMARY:  Successful US guided left thoracentesis. Yielded 850 ml of clear yellow fluid. Pt tolerated procedure well. No immediate complications.  Specimen sent for labs. CXR ordered; possible small pneumothorax identified, like incomplete expansion. Ok to return patient to the ED  EBL < 2 mL  Theresa Duty, NP 05/04/2021 3:38 PM

## 2021-05-04 NOTE — ED Provider Notes (Signed)
[facility] DEPT Provider Note   CSN: [id number] Arrival date & time: 05/04/21  0930     History Chief Complaint  Patient presents with   Shortness of Breath    [name] is a 80 y.o. female.  She has a history of CHF COPD A. fib not on anticoagulation.  Baseline uses 2 L nasal cannula.  Complaining of 2 months of gradually progressive shortness of breath dyspnea on exertion.  Cough productive of white sputum.  Intermittent left flank into left chest pain.  Worse with deep breath.  Yesterday had a PCP appointment had chest x-ray showing a large left pleural effusion.  No fevers or chills.  No abdominal pain vomiting diarrhea or urinary symptoms.  The history is provided by the patient.  Shortness of Breath Severity:  Moderate Onset quality:  Gradual Duration:  2 months Timing:  Constant Progression:  Worsening Chronicity:  New Context: activity   Relieved by:  Nothing Worsened by:  Activity and deep breathing Ineffective treatments:  Oxygen and diuretics Associated symptoms: chest pain, cough and sputum production   Associated symptoms: no abdominal pain, no fever, no headaches, no hemoptysis, no neck pain, no rash, no sore throat and no vomiting       Past Medical History:  Diagnosis Date   Allergy    Angiodysplasia of cecum    Angiodysplasia of duodenum    Arthritis    Asteatotic eczema 02/25/2016   Asthma    Atrial fibrillation ([place]) 10/17/2017   Blood transfusion without reported diagnosis    Cataract    CHF (congestive heart failure), NYHA class III, chronic, combined ([place]) 08/10/2017   Colonic polyp 02/16/2008   Tubular adenoma   COPD (chronic obstructive pulmonary disease) ([place]) 01/27/98   Cor pulmonale ([place]) 11/16/2014   GERD 07/30/1992   Gout    HTN (hypertension) 07/30/1988   Hyperlipidemia with target LDL less than 100 11/27/1996        Kidney stone 1960, 1972, 1991   MGUS (monoclonal gammopathy of unknown significance)  11/08/2016   Morbid obesity ([place]) 04/19/2010   She agrees to work on her lifestyle modifications to help her lose weight.    OSA (obstructive sleep apnea) 06/22/2013   Osteopenia, senile 02/05/2013   July 2014  -2.1 left femur -1.9 left forearm    Prediabetes 11/13/2006   Renal insufficiency    Stenosis of cervical spine with myelopathy (HCC) 01/01/2018   Ulcer of leg, chronic, right ([place]) 10/10/2011   ULCERATIVE COLITIS-LEFT SIDE 03/19/2008        Vitamin B12 deficiency anemia 11/13/2006        Vitamin D deficiency 02/06/2013    Patient Active Problem List   Diagnosis Date Noted   Subacute cough 05/03/2021   Pleural effusion, left 05/03/2021   Dysuria 09/28/2020   Irreducible ventral hernia 08/09/2020   COPD (chronic obstructive pulmonary disease) ([place]) 08/09/2020   Chronic respiratory failure with hypoxia ([place]) 02/06/2020   Routine general medical examination at a health care facility 02/06/2020   AVM (arteriovenous malformation) of small bowel, acquired with hemorrhage 08/14/2018   Multiple lung nodules on CT 05/05/2018   Stenosis of cervical spine with myelopathy ([place]) 01/01/2018   Atrial fibrillation ([place]) 10/17/2017   CHF (congestive heart failure), NYHA class III, chronic, diastolic ([place]) 88/82/8003   Angiodysplasia of cecum    Angiodysplasia of duodenum    Grade III diastolic dysfunction 49/17/9150   MGUS (monoclonal gammopathy of unknown significance) 11/08/2016   Asteatotic eczema  02/25/2016   COLD (chronic obstructive lung disease) (HCC)    Anemia, iron deficiency 11/16/2014   Cor pulmonale ([place]) 11/16/2014   OSA (obstructive sleep apnea) 06/22/2013   Chronic venous stasis dermatitis of both lower extremities 06/02/2013   Vitamin D deficiency 02/06/2013   Osteopenia, senile 02/05/2013   Other screening mammogram 02/05/2013   Chronic kidney disease, stage 3b ([place]) 06/20/2010   Morbid obesity ([place]) 04/19/2010   Gout 01/10/2009   ULCERATIVE COLITIS-LEFT SIDE 03/19/2008    Vitamin B12 deficiency anemia 11/13/2006   Hyperlipidemia with target LDL less than 100 11/27/1996   GERD 07/30/1992   HTN (hypertension) 07/30/1988    Past Surgical History:  Procedure Laterality Date   BRONCHOSCOPY     COLONOSCOPY     COLONOSCOPY WITH PROPOFOL N/A 02/23/2015   Procedure: COLONOSCOPY WITH PROPOFOL;  Surgeon: Ladene Artist, MD;  Location: [facility];  Service: Endoscopy;  Laterality: N/A;   COLONOSCOPY WITH PROPOFOL N/A 08/12/2018   Procedure: COLONOSCOPY WITH PROPOFOL;  Surgeon: Ladene Artist, MD;  Location: [facility];  Service: Endoscopy;  Laterality: N/A;   COLONOSCOPY WITH PROPOFOL N/A 05/09/2020   Procedure: COLONOSCOPY WITH PROPOFOL;  Surgeon: Ladene Artist, MD;  Location: [facility];  Service: Endoscopy;  Laterality: N/A;  To Splenic Flexture   ESOPHAGOGASTRODUODENOSCOPY (EGD) WITH PROPOFOL N/A 02/23/2015   Procedure: ESOPHAGOGASTRODUODENOSCOPY (EGD) WITH PROPOFOL;  Surgeon: Ladene Artist, MD;  Location: [facility];  Service: Endoscopy;  Laterality: N/A;   ESOPHAGOGASTRODUODENOSCOPY (EGD) WITH PROPOFOL N/A 08/12/2018   Procedure: ESOPHAGOGASTRODUODENOSCOPY (EGD) WITH PROPOFOL;  Surgeon: Ladene Artist, MD;  Location: [facility];  Service: Endoscopy;  Laterality: N/A;   ESOPHAGOGASTRODUODENOSCOPY (EGD) WITH PROPOFOL N/A 05/08/2020   Procedure: ESOPHAGOGASTRODUODENOSCOPY (EGD) WITH PROPOFOL;  Surgeon: Yetta Flock, MD;  Location: [facility];  Service: Gastroenterology;  Laterality: N/A;   HOT HEMOSTASIS N/A 08/12/2018   Procedure: HOT HEMOSTASIS (ARGON PLASMA COAGULATION/BICAP);  Surgeon: Ladene Artist, MD;  Location: [facility];  Service: Endoscopy;  Laterality: N/A;  EGD and Colon APC   HOT HEMOSTASIS N/A 05/08/2020   Procedure: HOT HEMOSTASIS (ARGON PLASMA COAGULATION/BICAP);  Surgeon: Yetta Flock, MD;  Location: [facility];  Service: Gastroenterology;  Laterality: N/A;   LAPAROTOMY N/A 08/11/2020   Procedure: EXPLORATORY  LAPAROTOMY PARTIAL COLECTOMY AND COLOSTOMY WITH RESECTION OF A MESSENTERIC MASS;  Surgeon: Coralie Keens, MD;  Location: [facility];  Service: General;  Laterality: N/A;   POLYPECTOMY       OB History   No obstetric history on file.     Family History  Problem Relation Age of Onset   Stroke Mother    Diabetes Mother    Kidney cancer Mother    Stomach cancer Mother    Heart disease Mother    Stroke Father    Heart disease Brother    Heart disease Brother    Crohn's disease Brother    Heart disease Sister        CATH,STENT   Clotting disorder Sister    Cervical cancer Sister    Lung cancer Sister    Heart attack Sister    Diabetes Sister    Colon cancer Neg Hx    Esophageal cancer Neg Hx    Rectal cancer Neg Hx     Social History   Tobacco Use   Smoking status: Former    Types: Cigarettes    Quit date: 06/01/1996    Years since quitting: 24.9   Smokeless tobacco: Never   Tobacco comments:  Quit 1998  Vaping Use   Vaping Use: Never used  Substance Use Topics   Alcohol use: No   Drug use: No    Home Medications Prior to Admission medications   Medication Sig Start Date End Date Taking? Authorizing Provider  acetaminophen (TYLENOL) 325 MG tablet Take 325-650 mg by mouth daily as needed for moderate pain, fever or headache.     [provider]  albuterol (PROAIR HFA) 108 (90 Base) MCG/ACT inhaler Inhale 1-2 puffs into the lungs every 6 (six) hours as needed for wheezing or shortness of breath. 10/30/16   [name], MD  allopurinol (ZYLOPRIM) 100 MG tablet TAKE 1 TABLET(100 MG) BY MOUTH DAILY 12/27/20   [name], MD  Budeson-Glycopyrrol-Formoterol (BREZTRI AEROSPHERE) 160-9-4.8 MCG/ACT AERO Inhale 2 puffs into the lungs 2 (two) times daily. 04/13/21   [name], MD  Dietary Management Product (VASCULERA) TABS Take 1 capsule by mouth daily. 01/31/21   [name], MD  ferrous sulfate 325 (65 FE) MG tablet Take 1 tablet (325 mg total) by  mouth 2 (two) times daily with a meal. 11/13/19   [name], MD  Fluticasone-Umeclidin-Vilant (TRELEGY ELLIPTA) 100-62.5-25 MCG/INH AEPB Inhale 1 puff into the lungs daily. 01/31/21   [name], MD  isosorbide-hydrALAZINE (BIDIL) 20-37.5 MG tablet TAKE 1 TABLET BY MOUTH THREE TIMES DAILY 03/20/21   [name], MD  metoprolol tartrate (LOPRESSOR) 25 MG tablet Take 1 tablet (25 mg total) by mouth 2 (two) times daily. 05/23/20   [name], MD  promethazine (PHENERGAN) 25 MG tablet Take 25 mg by mouth every 4 (four) hours as needed for nausea or vomiting. 09/03/20   [provider]  torsemide (DEMADEX) 20 MG tablet Take 1 tablet (20 mg total) by mouth 2 (two) times daily. 11/02/20   [name], MD    Allergies    Celebrex [celecoxib], Amlodipine besylate, Enalapril, Lipitor [atorvastatin], Amoxicillin, Codeine sulfate, and Hydrocodone-acetaminophen  Review of Systems   Review of Systems  Constitutional:  Negative for fever.  HENT:  Negative for sore throat.   Eyes:  Negative for visual disturbance.  Respiratory:  Positive for cough, sputum production and shortness of breath. Negative for hemoptysis.   Cardiovascular:  Positive for chest pain and leg swelling.  Gastrointestinal:  Negative for abdominal pain and vomiting.  Genitourinary:  Negative for dysuria.  Musculoskeletal:  Negative for neck pain.  Skin:  Negative for rash.  Neurological:  Negative for headaches.   Physical Exam Updated Vital Signs BP (!) 188/61 (BP Location: Left Arm)   Pulse 91   Temp 98.7 F (37.1 C) (Oral)   Resp 16   SpO2 91%   Physical Exam Vitals and nursing note reviewed.  Constitutional:      General: She is not in acute distress.    Appearance: She is well-developed.  HENT:     Head: Normocephalic and atraumatic.  Eyes:     Conjunctiva/sclera: Conjunctivae normal.  Cardiovascular:     Rate and Rhythm: Normal rate and regular rhythm.     Heart sounds: Murmur heard.   Pulmonary:     Effort: Pulmonary effort is normal. No respiratory distress.     Breath sounds: Examination of the left-middle field reveals decreased breath sounds. Examination of the left-lower field reveals decreased breath sounds. Decreased breath sounds present.  Abdominal:     Palpations: Abdomen is soft.     Tenderness: There is no abdominal tenderness.  Musculoskeletal:  General: Normal range of motion.     Cervical back: Neck supple.     Right lower leg: No tenderness. Edema present.     Left lower leg: No tenderness. Edema present.  Skin:    General: Skin is warm and dry.  Neurological:     General: No focal deficit present.     Mental Status: She is alert.    ED Results / Procedures / Treatments   Labs (all labs ordered are listed, but only abnormal results are displayed) Labs Reviewed  COMPREHENSIVE METABOLIC PANEL - Abnormal; Notable for the following components:      Result Value   Glucose, Bld 114 (*)    BUN 31 (*)    Creatinine, Ser 1.57 (*)    GFR, Estimated 33 (*)    All other components within normal limits  CBC WITH DIFFERENTIAL/PLATELET - Abnormal; Notable for the following components:   Hemoglobin 10.6 (*)    HCT 34.2 (*)    All other components within normal limits  RESP PANEL BY RT-PCR (FLU A&B, COVID) ARPGX2  BODY FLUID CULTURE W GRAM STAIN  PROTIME-INR  BRAIN NATRIURETIC PEPTIDE  BODY FLUID CELL COUNT WITH DIFFERENTIAL  AMYLASE, PLEURAL OR PERITONEAL FLUID                ALBUMIN, PLEURAL OR PERITONEAL FLUID   PROTEIN, PLEURAL OR PERITONEAL FLUID  CYTOLOGY - NON PAP  TROPONIN I (HIGH SENSITIVITY)  TROPONIN I (HIGH SENSITIVITY)    EKG EKG Interpretation  Date/Time:  Thursday May 04 2021 10:17:37 EDT Ventricular Rate:  85 PR Interval:  189 QRS Duration: 86 QT Interval:  345 QTC Calculation: 411 R Axis:   10 Text Interpretation: Sinus rhythm Probable left atrial enlargement LVH with secondary repolarization abnormality Lateral  ST//T wave changes compared with prior 1/22 Confirmed by Aletta Edouard [phone]) on 05/04/2021 10:22:16 AM  Radiology DG Chest 2 View  Result Date: 05/03/2021 CLINICAL DATA:  Cough with decreased breath sounds at the left lung base. Symptoms for 2-3 weeks. EXAM: CHEST - 2 VIEW COMPARISON:  Radiographs 08/12/2020.  CT 05/20/2018. FINDINGS: A previously demonstrated left pleural effusion has enlarged, and there is increasing opacity at the left lung base. The right lung appears clear. There is no significant right-sided pleural effusion or pneumothorax. The heart appears enlarged. Aortic atherosclerosis noted. There are stable degenerative changes in the spine. IMPRESSION: Enlarged moderate-sized left pleural effusion with left lower lobe consolidation or mass. If not previously performed, consider thoracentesis for further evaluation. Follow-up necessary to exclude underlying malignancy. Electronically Signed   By: Richardean Sale M.D.   On: 05/03/2021 16:09    Procedures Procedures   Medications Ordered in ED Medications  enoxaparin (LOVENOX) injection 30 mg (has no administration in time range)  acetaminophen (TYLENOL) tablet 650 mg (650 mg Oral Given 05/04/21 1613)    Or  acetaminophen (TYLENOL) suppository 650 mg ( Rectal See Alternative 05/04/21 1613)  ondansetron (ZOFRAN) tablet 4 mg ( Oral See Alternative 05/04/21 1644)    Or  ondansetron (ZOFRAN) injection 4 mg (4 mg Intravenous Given 05/04/21 1644)  lidocaine (XYLOCAINE) 1 % (with pres) injection (has no administration in time range)  HYDROmorphone (DILAUDID) injection 0.5 mg (has no administration in time range)  HYDROmorphone (DILAUDID) injection 0.75 mg (0.75 mg Intravenous Given 05/04/21 1643)    ED Course  I have reviewed the triage vital signs and the nursing notes.  Pertinent labs & imaging results that were available during my care of the patient  were reviewed by me and considered in my medical decision making (see chart for  details).  Clinical Course as of 05/04/21 1808  Thu May 04, 2021  1149 Discussed with [facility] hospitalist Dr. Olevia Bowens.  Have held off doing a CAT scan of her chest because it would need to be noncontrast and would probably be more diagnostic after the thoracentesis.  He said he would reach out to IR for their recommendations. [MB]    Clinical Course User Index [MB] Hayden Rasmussen, MD   MDM Rules/Calculators/A&P                          This patient complains of shortness of breath dyspnea on exertion left-sided chest pain; this involves an extensive number of treatment Options and is a complaint that carries with it a high risk of complications and Morbidity. The differential includes ACS, pneumonia, PE, effusion, pneumothorax, pleurisy, vascular  I ordered, reviewed and interpreted labs, which included CBC with normal white count, hemoglobin slightly lower than priors, chemistries with mildly elevated creatinine, COVID and flu negative, troponins flat  Additional history obtained from patient's daughter Previous records obtained and reviewed in epic including recent PCP visit I consulted Dr. Olevia Bowens [facility] hospitalist and discussed lab and imaging findings  Critical Interventions: None  After the interventions stated above, I reevaluated the patient and found patient still to be symptomatic.  Discussed with hospitalist who agrees to admit patient and set up with interventional radiology for thoracentesis.  Deferring further imaging until after thoracentesis.  Patient in agreement with plan for admission.  Clinical Course as of 05/04/21 1808  Thu May 04, 2021  1149 Discussed with [facility] hospitalist Dr. Olevia Bowens.  Have held off doing a CAT scan of her chest because it would need to be noncontrast and would probably be more diagnostic after the thoracentesis.  He said he would reach out to IR for their recommendations. [MB]    Clinical Course User Index [MB] Hayden Rasmussen, MD  Laurie ZAVALETA was evaluated in Emergency Department on 05/04/2021 for the symptoms described in the history of present illness. She was evaluated in the context of the global COVID-19 pandemic, which necessitated consideration that the patient might be at risk for infection with the SARS-CoV-2 virus that causes COVID-19. Institutional protocols and algorithms that pertain to the evaluation of patients at risk for COVID-19 are in a state of rapid change based on information released by regulatory bodies including the CDC and federal and state organizations. These policies and algorithms were followed during the patient's care in the ED.   Final Clinical Impression(s) / ED Diagnoses Final diagnoses:  Shortness of breath  Pleural effusion on left    Rx / DC Orders ED Discharge Orders     None        Hayden Rasmussen, MD 05/04/21 1811

## 2021-05-04 NOTE — ED Notes (Signed)
Patient asked if she wanted the pain medicine ordered for her. She declined

## 2021-05-04 NOTE — ED Triage Notes (Signed)
Pt sent to ED by PCP d/t pleural effusion. Had chest x-ray yesterday She reports SOB x 2 months.

## 2021-05-04 NOTE — ED Notes (Signed)
Meal tray given to patient after ordered by provider

## 2021-05-04 NOTE — H&P (Signed)
History and Physical    [name] CHY:[id number] DOB: 1941-05-22 DOA: 05/04/2021  PCP: [name], MD  Patient coming from: Home.  I have personally briefly reviewed patient's old medical records in [place]  Chief Complaint: Abnormal checks x-ray and shortness of breath.  HPI: [name] is a 80 y.o. 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  Sometimes the cough produces pleuritic chest pain.  She has been sleeping on her recliner.  She said at times she has felt subjective fever and chills.  No night sweats.  Denied hemoptysis or weight loss.  No rhinorrhea, sore throat or wheezing.  No typical chest pain, palpitations, diaphoresis, PND or recent lower extremity pitting edema.  She has chronic abdominal pain which is unchanged.  No nausea or vomiting.  No changes in ostomy output pattern or consistency.  Denied flank pain, dysuria, frequency or hematuria.  No polyuria, polydipsia, polyphagia or blurred vision.  ED Course: Initial vital signs were temperature 98.7 F, pulse 91, respirations 16, BP 188/61 mmHg and O2 sat 91%.  I spoke to IR on call Jacqulynn Cadet, MD) and place orders for ultrasound-guided thoracentesis.  Lab work: Her CBC showed a white count 9.3, hemoglobin 10.6 g/dL platelets 220.  PT and INR were normal.  Troponin was 7 ng/L.  BNP 34.9 pg/mL.  CMP showed a glucose of 114, BUN of 31  and creatinine of 1.57 mg/dL.  All other CMP values are within expected range.  Imaging: A 2 view chest radiograph done yesterday showed an enlarging moderate size left pleural effusion with left lower lobe consolidation or mass.  Thoracentesis and imaging follow-up recommended.  Please see images and full daily report for further detail.  Review of Systems: As per HPI otherwise all other systems reviewed and are negative.  Past Medical History:  Diagnosis Date   Allergy    Angiodysplasia of cecum    Angiodysplasia of duodenum    Arthritis    Asteatotic eczema 02/25/2016   Asthma    Atrial fibrillation ([place]) 10/17/2017   Blood transfusion without reported diagnosis    Cataract    CHF (congestive heart failure), NYHA class III, chronic, combined ([place]) 08/10/2017   Colonic polyp 02/16/2008   Tubular adenoma   COPD (chronic obstructive pulmonary disease) ([place]) 01/27/98   Cor pulmonale ([place]) 11/16/2014   GERD 07/30/1992   Gout    HTN (hypertension) 07/30/1988   Hyperlipidemia with target LDL less than 100 11/27/1996        Kidney stone 1960, 1972, 1991   MGUS (monoclonal gammopathy of unknown significance) 11/08/2016   Morbid obesity ([place]) 04/19/2010   She agrees to work on her lifestyle modifications to help her lose weight.    OSA (obstructive sleep apnea) 06/22/2013   Osteopenia, senile 02/05/2013   July 2014  -2.1 left femur -1.9 left forearm    Prediabetes 11/13/2006   Renal insufficiency    Stenosis of cervical spine with myelopathy ([place]) 01/01/2018   Ulcer of leg, chronic, right ([place]) 10/10/2011   ULCERATIVE  COLITIS-LEFT SIDE 03/19/2008        Vitamin B12 deficiency anemia 11/13/2006        Vitamin D deficiency 02/06/2013   Past Surgical History:  Procedure Laterality Date   BRONCHOSCOPY     COLONOSCOPY     COLONOSCOPY WITH PROPOFOL N/A 02/23/2015   Procedure: COLONOSCOPY WITH PROPOFOL;  Surgeon: Ladene Artist, MD;  Location: [facility];  Service: Endoscopy;  Laterality: N/A;    COLONOSCOPY WITH PROPOFOL N/A 08/12/2018   Procedure: COLONOSCOPY WITH PROPOFOL;  Surgeon: Ladene Artist, MD;  Location: [facility];  Service: Endoscopy;  Laterality: N/A;   COLONOSCOPY WITH PROPOFOL N/A 05/09/2020   Procedure: COLONOSCOPY WITH PROPOFOL;  Surgeon: Ladene Artist, MD;  Location: [facility];  Service: Endoscopy;  Laterality: N/A;  To Splenic Flexture   ESOPHAGOGASTRODUODENOSCOPY (EGD) WITH PROPOFOL N/A 02/23/2015   Procedure: ESOPHAGOGASTRODUODENOSCOPY (EGD) WITH PROPOFOL;  Surgeon: Ladene Artist, MD;  Location: [facility];  Service: Endoscopy;  Laterality: N/A;   ESOPHAGOGASTRODUODENOSCOPY (EGD) WITH PROPOFOL N/A 08/12/2018   Procedure: ESOPHAGOGASTRODUODENOSCOPY (EGD) WITH PROPOFOL;  Surgeon: Ladene Artist, MD;  Location: [facility];  Service: Endoscopy;  Laterality: N/A;   ESOPHAGOGASTRODUODENOSCOPY (EGD) WITH PROPOFOL N/A 05/08/2020   Procedure: ESOPHAGOGASTRODUODENOSCOPY (EGD) WITH PROPOFOL;  Surgeon: Yetta Flock, MD;  Location: [facility];  Service: Gastroenterology;  Laterality: N/A;   HOT HEMOSTASIS N/A 08/12/2018   Procedure: HOT HEMOSTASIS (ARGON PLASMA COAGULATION/BICAP);  Surgeon: Ladene Artist, MD;  Location: [facility];  Service: Endoscopy;  Laterality: N/A;  EGD and Colon APC   HOT HEMOSTASIS N/A 05/08/2020   Procedure: HOT HEMOSTASIS (ARGON PLASMA COAGULATION/BICAP);  Surgeon: Yetta Flock, MD;  Location: [facility];  Service: Gastroenterology;  Laterality: N/A;   LAPAROTOMY N/A 08/11/2020   Procedure: EXPLORATORY LAPAROTOMY PARTIAL COLECTOMY AND COLOSTOMY WITH RESECTION OF A MESSENTERIC MASS;  Surgeon: Coralie Keens, MD;  Location: [facility];  Service: General;  Laterality: N/A;   POLYPECTOMY     Social History  reports that she quit smoking about 24 years ago. Her smoking use included cigarettes. She has never used smokeless tobacco. She reports that she does not drink alcohol and does not use drugs.  Allergies  Allergen  Reactions   Celebrex [Celecoxib]     edema   Amlodipine Besylate Other (See Comments)    REACTION: edema   Enalapril Cough   Lipitor [Atorvastatin]     Muscle aches   Amoxicillin Diarrhea    DID THE REACTION INVOLVE: Swelling of the face/tongue/throat, SOB, or low BP? N Sudden or severe rash/hives, skin peeling, or the inside of the mouth or nose? N Did it require medical treatment? N When did it last happen?      MORE THAN 10 YEARS If all above answers are "NO", may proceed with cephalosporin use.    Codeine Sulfate Nausea Only   Hydrocodone-Acetaminophen Nausea Only   Family History  Problem Relation Age of Onset   Stroke Mother    Diabetes Mother    Kidney cancer Mother    Stomach cancer Mother    Heart disease Mother    Stroke Father    Heart disease Brother    Heart disease Brother    Crohn's disease Brother    Heart disease Sister        CATH,STENT   Clotting disorder Sister    Cervical cancer Sister    Lung cancer Sister    Heart attack Sister    Diabetes Sister    Colon cancer Neg  Hx    Esophageal cancer Neg Hx    Rectal cancer Neg Hx    Prior to Admission medications   Medication Sig Start Date End Date Taking? Authorizing Provider  acetaminophen (TYLENOL) 325 MG tablet Take 325-650 mg by mouth daily as needed for moderate pain, fever or headache.     [provider]  albuterol (PROAIR HFA) 108 (90 Base) MCG/ACT inhaler Inhale 1-2 puffs into the lungs every 6 (six) hours as needed for wheezing or shortness of breath. 10/30/16   [name], MD  allopurinol (ZYLOPRIM) 100 MG tablet TAKE 1 TABLET(100 MG) BY MOUTH DAILY 12/27/20   [name], MD  Budeson-Glycopyrrol-Formoterol (BREZTRI AEROSPHERE) 160-9-4.8 MCG/ACT AERO Inhale 2 puffs into the lungs 2 (two) times daily. 04/13/21   [name], MD  Dietary Management Product (VASCULERA) TABS Take 1 capsule by mouth daily. 01/31/21   [name], MD  ferrous sulfate 325 (65 FE) MG tablet Take 1  tablet (325 mg total) by mouth 2 (two) times daily with a meal. 11/13/19   [name], MD  Fluticasone-Umeclidin-Vilant (TRELEGY ELLIPTA) 100-62.5-25 MCG/INH AEPB Inhale 1 puff into the lungs daily. 01/31/21   [name], MD  isosorbide-hydrALAZINE (BIDIL) 20-37.5 MG tablet TAKE 1 TABLET BY MOUTH THREE TIMES DAILY 03/20/21   [name], MD  metoprolol tartrate (LOPRESSOR) 25 MG tablet Take 1 tablet (25 mg total) by mouth 2 (two) times daily. 05/23/20   [name], MD  promethazine (PHENERGAN) 25 MG tablet Take 25 mg by mouth every 4 (four) hours as needed for nausea or vomiting. 09/03/20   [provider]  torsemide (DEMADEX) 20 MG tablet Take 1 tablet (20 mg total) by mouth 2 (two) times daily. 11/02/20   [name], MD   Physical Exam: Vitals:   05/04/21 0938 05/04/21 1000 05/04/21 1145  BP: (!) 188/61 (!) 183/67 (!) 180/57  Pulse: 91 88 80  Resp: 16 20 (!) 22  Temp: 98.7 F (37.1 C)    TempSrc: Oral    SpO2: 91% 91% 98%   Constitutional: Chronically ill-appearing.  In NAD. Eyes: PERRL, lids and conjunctivae normal ENMT: Nasal cannula in place 2 LPM.  Mucous membranes are moist. Posterior pharynx clear of any exudate or lesions. Neck: normal, supple, no masses, no thyromegaly Respiratory: Absent breath sounds in left lower and middle lung fields, no wheezing, no crackles. Normal respiratory effort. No accessory muscle use.  Cardiovascular: Regular rate and rhythm, no murmurs / rubs / gallops. No extremity edema. 2+ pedal pulses. No carotid bruits.  Abdomen: Obese, ventral hernia and left colostomy present, bowel sounds positive.  Soft, positive epigastric tenderness, no masses palpated. No hepatosplenomegaly. Bowel sounds positive.  Musculoskeletal: no clubbing / cyanosis.  Good ROM, no contractures. Normal muscle tone.  Skin: Healed bilateral lower pretibial area venous stasis ulcers. Neurologic: CN 2-12 grossly intact. Sensation intact, DTR normal. Strength  5/5 in all 4.  Psychiatric: Normal judgment and insight. Alert and oriented x 3. Normal mood.   Labs on Admission: I have personally reviewed following labs and imaging studies  CBC: Recent Labs  Lab 05/04/21 1025  WBC 9.3  NEUTROABS 7.2  HGB 10.6*  HCT 34.2*  MCV 84.4  PLT 182    Basic Metabolic Panel: Recent Labs  Lab 05/04/21 1025  NA 142  K 4.5  CL 105  CO2 32  GLUCOSE 114*  BUN 31*  CREATININE 1.57*  CALCIUM 10.0    GFR: Estimated  Creatinine Clearance: 27 mL/min (A) (by C-G formula based on SCr of 1.57 mg/dL (H)).  Liver Function Tests: Recent Labs  Lab 05/04/21 1025  AST 16  ALT 10  ALKPHOS 59  BILITOT 0.5  PROT 8.0  ALBUMIN 3.7   Radiological Exams on Admission: DG Chest 2 View  Result Date: 05/03/2021 CLINICAL DATA:  Cough with decreased breath sounds at the left lung base. Symptoms for 2-3 weeks. EXAM: CHEST - 2 VIEW COMPARISON:  Radiographs 08/12/2020.  CT 05/20/2018. FINDINGS: A previously demonstrated left pleural effusion has enlarged, and there is increasing opacity at the left lung base. The right lung appears clear. There is no significant right-sided pleural effusion or pneumothorax. The heart appears enlarged. Aortic atherosclerosis noted. There are stable degenerative changes in the spine. IMPRESSION: Enlarged moderate-sized left pleural effusion with left lower lobe consolidation or mass. If not previously performed, consider thoracentesis for further evaluation. Follow-up necessary to exclude underlying malignancy. Electronically Signed   By: Richardean Sale M.D.   On: 05/03/2021 16:09    EKG: Independently reviewed.  Vent. rate 85 BPM PR interval 189 ms QRS duration 86 ms QT/QTcB 345/411 ms P-R-T axes 31 10 93 Sinus rhythm Probable left atrial enlargement LVH with secondary repolarization abnormality Lateral ST//T wave changes compared with prior 1/22  Assessment/Plan Principal Problem:    Acute on chronic respiratory failure with  hypoxia (HCC) In the setting of:  Pleural effusion on left Observation/telemetry. Keep n.p.o. Discussed with IR. Follow pleural fluid cytology, chemistry and cultures. Will also need imaging follow-up.  Active Problems:   Gout Continue allopurinol 100 mg p.o. daily.    HTN (hypertension) Continue metoprolol 25 mg p.o. twice daily.    Chronic kidney disease, stage 3b (HCC) Monitor GFR and electrolytes.    CHF (congestive heart failure), NYHA class III, chronic, diastolic (HCC) Continue metoprolol 25 mg p.o. twice daily. Continue torsemide 20 mg p.o. twice daily.    Normocytic anemia Continue iron supplementation. Monitor hematocrit and hemoglobin.     DVT prophylaxis: Lovenox SQ. Code Status:   Full code. Family Communication:   Disposition Plan:   Patient is from:  Home.  Anticipated DC to:  Home.  Anticipated DC date:  05/05/2021.  Anticipated DC barriers: Clinical status/consultant sign off.  Consults called:  Interventional radiology Jacqulynn Cadet, MD). Admission status:  Observation/telemetry.  Severity of Illness:  High severity after presenting with acute on chronic respiratory failure secondary to post-COVID left pleural effusion.  The patient will need to remain for imaging guided thoracentesis by IR.  Reubin Milan MD [facility]  How to contact the [facility] Attending or Consulting provider [place] or covering provider during after hours [place], for this patient?   Check the care team in [facility] and look for a) attending/consulting [facility] provider listed and b) the [facility] team listed Log into [email or web address] and use [place]'s universal password to access. If you do not have the password, please contact the hospital operator. Locate the [facility] provider you are looking for under [facility] and page to a number that you can be directly reached. If you still have difficulty reaching the provider, please page the [facility] (Director on Call) for the  Hospitalists listed on amion for assistance.  05/04/2021, 12:01 PM   This document was prepared using Dragon voice recognition software and may contain some unintended transcription errors.

## 2021-05-04 NOTE — ED Notes (Signed)
X2 [place], [place] on ice tube sent as saves if needed.

## 2021-05-05 ENCOUNTER — Observation Stay (HOSPITAL_COMMUNITY): Payer: Medicare Other

## 2021-05-05 DIAGNOSIS — Z6835 Body mass index (BMI) 35.0-35.9, adult: Secondary | ICD-10-CM | POA: Diagnosis not present

## 2021-05-05 DIAGNOSIS — Z87442 Personal history of urinary calculi: Secondary | ICD-10-CM | POA: Diagnosis not present

## 2021-05-05 DIAGNOSIS — R0602 Shortness of breath: Secondary | ICD-10-CM | POA: Diagnosis present

## 2021-05-05 DIAGNOSIS — I10 Essential (primary) hypertension: Secondary | ICD-10-CM

## 2021-05-05 DIAGNOSIS — I130 Hypertensive heart and chronic kidney disease with heart failure and stage 1 through stage 4 chronic kidney disease, or unspecified chronic kidney disease: Secondary | ICD-10-CM | POA: Diagnosis present

## 2021-05-05 DIAGNOSIS — M8588 Other specified disorders of bone density and structure, other site: Secondary | ICD-10-CM | POA: Diagnosis present

## 2021-05-05 DIAGNOSIS — E785 Hyperlipidemia, unspecified: Secondary | ICD-10-CM | POA: Diagnosis present

## 2021-05-05 DIAGNOSIS — D649 Anemia, unspecified: Secondary | ICD-10-CM

## 2021-05-05 DIAGNOSIS — G4733 Obstructive sleep apnea (adult) (pediatric): Secondary | ICD-10-CM | POA: Diagnosis present

## 2021-05-05 DIAGNOSIS — E669 Obesity, unspecified: Secondary | ICD-10-CM | POA: Diagnosis present

## 2021-05-05 DIAGNOSIS — I2781 Cor pulmonale (chronic): Secondary | ICD-10-CM | POA: Diagnosis present

## 2021-05-05 DIAGNOSIS — Z87891 Personal history of nicotine dependence: Secondary | ICD-10-CM | POA: Diagnosis not present

## 2021-05-05 DIAGNOSIS — I4891 Unspecified atrial fibrillation: Secondary | ICD-10-CM | POA: Diagnosis present

## 2021-05-05 DIAGNOSIS — Z20822 Contact with and (suspected) exposure to covid-19: Secondary | ICD-10-CM | POA: Diagnosis present

## 2021-05-05 DIAGNOSIS — Z8719 Personal history of other diseases of the digestive system: Secondary | ICD-10-CM | POA: Diagnosis not present

## 2021-05-05 DIAGNOSIS — J90 Pleural effusion, not elsewhere classified: Secondary | ICD-10-CM | POA: Diagnosis not present

## 2021-05-05 DIAGNOSIS — M1030 Gout due to renal impairment, unspecified site: Secondary | ICD-10-CM | POA: Diagnosis not present

## 2021-05-05 DIAGNOSIS — J9621 Acute and chronic respiratory failure with hypoxia: Secondary | ICD-10-CM | POA: Diagnosis present

## 2021-05-05 DIAGNOSIS — I5032 Chronic diastolic (congestive) heart failure: Secondary | ICD-10-CM | POA: Diagnosis not present

## 2021-05-05 DIAGNOSIS — Z8249 Family history of ischemic heart disease and other diseases of the circulatory system: Secondary | ICD-10-CM | POA: Diagnosis not present

## 2021-05-05 DIAGNOSIS — M109 Gout, unspecified: Secondary | ICD-10-CM | POA: Diagnosis present

## 2021-05-05 DIAGNOSIS — Z8051 Family history of malignant neoplasm of kidney: Secondary | ICD-10-CM | POA: Diagnosis not present

## 2021-05-05 DIAGNOSIS — J9611 Chronic respiratory failure with hypoxia: Secondary | ICD-10-CM

## 2021-05-05 DIAGNOSIS — J302 Other seasonal allergic rhinitis: Secondary | ICD-10-CM | POA: Diagnosis present

## 2021-05-05 DIAGNOSIS — I517 Cardiomegaly: Secondary | ICD-10-CM | POA: Diagnosis not present

## 2021-05-05 DIAGNOSIS — E538 Deficiency of other specified B group vitamins: Secondary | ICD-10-CM | POA: Diagnosis present

## 2021-05-05 DIAGNOSIS — J449 Chronic obstructive pulmonary disease, unspecified: Secondary | ICD-10-CM | POA: Diagnosis present

## 2021-05-05 DIAGNOSIS — N1832 Chronic kidney disease, stage 3b: Secondary | ICD-10-CM | POA: Diagnosis not present

## 2021-05-05 DIAGNOSIS — K219 Gastro-esophageal reflux disease without esophagitis: Secondary | ICD-10-CM | POA: Diagnosis present

## 2021-05-05 DIAGNOSIS — E559 Vitamin D deficiency, unspecified: Secondary | ICD-10-CM | POA: Diagnosis present

## 2021-05-05 LAB — AMYLASE, PLEURAL OR PERITONEAL FLUID: Amylase, Fluid: 134 U/L

## 2021-05-05 LAB — GRAM STAIN

## 2021-05-05 MED ORDER — DIPHENHYDRAMINE HCL 25 MG PO CAPS
ORAL_CAPSULE | ORAL | Status: AC
  Filled 2021-05-05: qty 1

## 2021-05-05 MED ORDER — SODIUM CHLORIDE 0.9 % IV SOLN
500.0000 mg | Freq: Every day | INTRAVENOUS | Status: DC
  Administered 2021-05-05 – 2021-05-09 (×5): 500 mg via INTRAVENOUS
  Filled 2021-05-05 (×5): qty 500

## 2021-05-05 MED ORDER — DIPHENHYDRAMINE HCL 25 MG PO CAPS
25.0000 mg | ORAL_CAPSULE | Freq: Four times a day (QID) | ORAL | Status: DC | PRN
  Administered 2021-05-05 – 2021-05-06 (×2): 25 mg via ORAL
  Filled 2021-05-05: qty 1

## 2021-05-05 MED ORDER — FLUTICASONE-UMECLIDIN-VILANT 100-62.5-25 MCG/INH IN AEPB: 1.0000 | INHALATION_SPRAY | Freq: Every day | RESPIRATORY_TRACT | Status: DC

## 2021-05-05 MED ORDER — ALBUTEROL SULFATE (2.5 MG/3ML) 0.083% IN NEBU: 3.0000 mL | INHALATION_SOLUTION | Freq: Four times a day (QID) | RESPIRATORY_TRACT | Status: DC | PRN

## 2021-05-05 MED ORDER — ISOSORB DINITRATE-HYDRALAZINE 20-37.5 MG PO TABS
1.0000 | ORAL_TABLET | Freq: Three times a day (TID) | ORAL | Status: DC
  Administered 2021-05-05 – 2021-05-09 (×12): 1 via ORAL
  Filled 2021-05-05 (×14): qty 1

## 2021-05-05 MED ORDER — SODIUM CHLORIDE 0.9 % IV SOLN
1.0000 g | Freq: Every day | INTRAVENOUS | Status: DC
  Administered 2021-05-05 – 2021-05-09 (×5): 1 g via INTRAVENOUS
  Filled 2021-05-05 (×5): qty 10

## 2021-05-05 MED ORDER — FLUTICASONE FUROATE-VILANTEROL 100-25 MCG/INH IN AEPB
1.0000 | INHALATION_SPRAY | Freq: Every day | RESPIRATORY_TRACT | Status: DC
  Administered 2021-05-06 – 2021-05-09 (×4): 1 via RESPIRATORY_TRACT
  Filled 2021-05-05: qty 28

## 2021-05-05 MED ORDER — TORSEMIDE 20 MG PO TABS
20.0000 mg | ORAL_TABLET | Freq: Two times a day (BID) | ORAL | Status: DC
  Administered 2021-05-05 – 2021-05-09 (×8): 20 mg via ORAL
  Filled 2021-05-05 (×9): qty 1

## 2021-05-05 MED ORDER — FERROUS SULFATE 325 (65 FE) MG PO TABS
325.0000 mg | ORAL_TABLET | Freq: Two times a day (BID) | ORAL | Status: DC
Start: 2021-05-05 — End: 2021-05-09
  Administered 2021-05-05 – 2021-05-09 (×8): 325 mg via ORAL
  Filled 2021-05-05 (×8): qty 1

## 2021-05-05 MED ORDER — ALLOPURINOL 100 MG PO TABS
100.0000 mg | ORAL_TABLET | Freq: Every day | ORAL | Status: DC
  Administered 2021-05-05 – 2021-05-09 (×5): 100 mg via ORAL
  Filled 2021-05-05 (×5): qty 1

## 2021-05-05 MED ORDER — METOPROLOL TARTRATE 25 MG PO TABS
25.0000 mg | ORAL_TABLET | Freq: Two times a day (BID) | ORAL | Status: DC
  Administered 2021-05-05 – 2021-05-09 (×9): 25 mg via ORAL
  Filled 2021-05-05 (×9): qty 1

## 2021-05-05 MED ORDER — UMECLIDINIUM BROMIDE 62.5 MCG/INH IN AEPB
1.0000 | INHALATION_SPRAY | Freq: Every day | RESPIRATORY_TRACT | Status: DC
  Administered 2021-05-06 – 2021-05-09 (×4): 1 via RESPIRATORY_TRACT
  Filled 2021-05-05: qty 7

## 2021-05-05 NOTE — Progress Notes (Signed)
PROGRESS NOTE  [name] RWE:[id number] DOB: [birth year] DOA: 05/04/2021 PCP: [name], MD   LOS: 0 days   Brief narrative:  [name] is a 80 y.o. 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  Patient does have chronic abdominal pain which was unchanged.  In the ED WBC was 9.3.  Troponin was negative.  Creatinine was slightly elevated at 1.5.  Chest x-ray showed moderate left-sided effusion with left lower lobe consolidation or mass.  Patient had elevated blood pressure and pulse ox was 91%.  Patient underwent thoracocentesis by interventional radiology in the ED for left-sided pleural effusion and was admitted hospital for further evaluation and treatment.  Assessment/Plan:  Principal Problem:   Pleural effusion on left Active Problems:   Gout   HTN (hypertension)   Chronic kidney disease, stage 3b (HCC)   CHF (congestive heart failure), NYHA class III, chronic, diastolic (HCC)   Chronic respiratory failure with hypoxia (HCC)   Normocytic anemia   Pleural effusion  Acute on chronic respiratory failure with hypoxia with left pleural effusion  Patient underwent ultrasound-guided thoracocentesis on 05/04/2021 with removal of 850 mL of pleural fluid.  Patient still complains of mild discomfort at site.  Status post removal with improvement in symptoms.  Chest x-ray shows evidence of consolidation.  Will empirically treat with antibiotic.  No organisms seen in gram stain.  Fluid culture negative in less than 24 hours.  Exudative effusion by pleural fluid  protein criteria.  Continue inhalers    Gout Continue allopurinol   Essential hypertension Metoprolol twice a day.  Continue isosorbide and hydralazine.  Continue to monitor blood pressure.     Chronic kidney disease, stage 3b Continue to monitor creatinine levels.  Latest creatinine of 1.5.     CHF congestive heart failure), NYHA class III, chronic, diastolic On metoprolol, will resume torsemide.  We will continue for now.  Strict intake and output charting, daily weights.  BNP at 34 .     Normocytic anemia On iron supplementation.  Latest hemoglobin of 10.6  Debility deconditioning.  We will get PT evaluation.    DVT prophylaxis: enoxaparin (LOVENOX) injection 30 mg Start: 05/04/21 2200  Code Status: Full code  Family Communication: Spoke with the  patient's granddaughter at bedside   Status is: Inpatient  Remains inpatient appropriate because:Unsafe d/c plan, IV treatments appropriate due to intensity of illness or inability to take PO, and Inpatient level of care appropriate due to severity of illness  Dispo: The patient is from: Home              Anticipated d/c is to: Home              Patient currently is not medically stable to d/c.   Difficult to place patient No  Consultants: Interventional radiology  Procedures: Ultrasound guided left-sided thoracocentesis on 05/04/2021  Anti-infectives:  Rocephin and Zithromax 10/7>  Anti-infectives (From admission, onward)    None      Subjective: Today, patient was seen and examined at bedside.  Feels a little better with breathing after thoracocentesis.  Still has some cough, shortness of breath.  Objective: Vitals:   05/05/21 1241 05/05/21 1347  BP:  Marland Kitchen)  160/63  Pulse:  67  Resp:  16  Temp: 98.2 F (36.8 C) 97.6 F (36.4 C)  SpO2:  98%   No intake or output data in the 24 hours ending 05/05/21 1412 Filed Weights   05/05/21 1347  Weight: 81.3 kg   Body mass index is 35 kg/m.   Physical  Exam: GENERAL: Patient is alert awake and oriented. Not in obvious distress.  Chronically ill-appearing, on nasal cannula in place.  Obese. HENT: No scleral pallor or icterus. Pupils equally reactive to light. Oral mucosa is moist NECK: is supple, no gross swelling noted. CHEST: Diminished breath sounds mostly on the left side. CVS: S1 and S2 heard, no murmur. Regular rate and rhythm.  ABDOMEN: Soft, non-tender, bowel sounds are present.  Colostomy and ventral hernia in place.  Nonspecific tenderness EXTREMITIES: Left lower extremity pretibial venous stasis  CNS: Cranial nerves are intact. No focal motor deficits. SKIN: warm and dry, venous stasis bilateral lower extremity  Data Review: I have personally reviewed the following laboratory data and studies,  CBC: Recent Labs  Lab 05/04/21 1025  WBC 9.3  NEUTROABS 7.2  HGB 10.6*  HCT 34.2*  MCV 84.4  PLT 694   Basic Metabolic Panel: Recent Labs  Lab 05/04/21 1025  NA 142  K 4.5  CL 105  CO2 32  GLUCOSE 114*  BUN 31*  CREATININE 1.57*  CALCIUM 10.0   Liver Function Tests: Recent Labs  Lab 05/04/21 1025  AST 16  ALT 10  ALKPHOS 59  BILITOT 0.5  PROT 8.0  ALBUMIN 3.7   No results for input(s): LIPASE, AMYLASE in the last 168 hours. No results for input(s): AMMONIA in the last 168 hours. Cardiac Enzymes: No results for input(s): CKTOTAL, CKMB, CKMBINDEX, TROPONINI in the last 168 hours. BNP (last 3 results) Recent Labs    05/04/21 1023  BNP 34.9    ProBNP (last 3 results) No results for input(s): PROBNP in the last 8760 hours.  CBG: No results for input(s): GLUCAP in the last 168 hours. Recent Results (from the past 240 hour(s))  Resp Panel by RT-PCR (Flu A&B, Covid) Nasopharyngeal Swab     Status: None   Collection Time: 05/04/21 11:03 AM   Specimen: Nasopharyngeal Swab; Nasopharyngeal(NP) swabs in vial transport medium  Result Value Ref Range Status   SARS Coronavirus 2 by RT PCR NEGATIVE NEGATIVE Final     Comment: (NOTE) SARS-CoV-2 target nucleic acids are NOT DETECTED.  The SARS-CoV-2 RNA is generally detectable in upper respiratory specimens during the acute phase of infection. The lowest concentration of SARS-CoV-2 viral copies this assay can detect is 138 copies/mL. A negative result does not preclude SARS-Cov-2 infection and should not be used as the sole basis for treatment or other patient management decisions. A negative result may occur with  improper specimen collection/handling, submission of specimen other than nasopharyngeal swab, presence of viral mutation(s) within the areas targeted by this assay, and inadequate number of viral copies(<138 copies/mL). A negative result must be combined with clinical observations, patient history, and epidemiological information. The expected result is Negative.  Fact Sheet for Patients:  EntrepreneurPulse.com.au  Fact Sheet for Healthcare Providers:  IncredibleEmployment.be  This test is no t yet approved or cleared by the [place] FDA and  has been authorized for detection and/or diagnosis of SARS-CoV-2 by FDA under an Emergency Use Authorization (EUA). This EUA will remain  in effect (meaning this test can be used) for the duration of the COVID-19 declaration  under Section 564(b)(1) of the Act, 21 U.S.C.section 360bbb-3(b)(1), unless the authorization is terminated  or revoked sooner.       Influenza A by PCR NEGATIVE NEGATIVE Final   Influenza B by PCR NEGATIVE NEGATIVE Final    Comment: (NOTE) The Xpert Xpress SARS-CoV-2/FLU/RSV plus assay is intended as an aid in the diagnosis of influenza from Nasopharyngeal swab specimens and should not be used as a sole basis for treatment. Nasal washings and aspirates are unacceptable for Xpert Xpress SARS-CoV-2/FLU/RSV testing.  Fact Sheet for Patients: EntrepreneurPulse.com.au  Fact Sheet for Healthcare  Providers: IncredibleEmployment.be  This test is not yet approved or cleared by the [place] FDA and has been authorized for detection and/or diagnosis of SARS-CoV-2 by FDA under an Emergency Use Authorization (EUA). This EUA will remain in effect (meaning this test can be used) for the duration of the COVID-19 declaration under Section 564(b)(1) of the Act, 21 U.S.C. section 360bbb-3(b)(1), unless the authorization is terminated or revoked.  Performed at [facility], [place] [address], [place], [place] [address]   Culture, body fluid w Gram Stain-bottle     Status: None (Preliminary result)   Collection Time: 05/04/21  3:15 PM   Specimen: Pleura  Result Value Ref Range Status   Specimen Description PLEURAL  Final   Special Requests BOTTLES DRAWN AEROBIC AND ANAEROBIC  Final   Culture   Final    NO GROWTH < 24 HOURS Performed at [facility], [place] [address] [place], [place] [address]    Report Status PENDING  Incomplete  Gram stain     Status: None   Collection Time: 05/04/21  3:15 PM   Specimen: Pleura  Result Value Ref Range Status   Specimen Description PLEURAL  Final   Special Requests NONE  Final   Gram Stain   Final    WBC PRESENT,BOTH PMN AND MONONUCLEAR NO ORGANISMS SEEN CYTOSPIN SMEAR Performed at [facility], [address] [place], [place] [address]    Report Status 05/05/2021 FINAL  Final     Studies: DG Chest 1 View  Result Date: 05/04/2021 CLINICAL DATA:  Post thoracentesis EXAM: CHEST  1 VIEW COMPARISON:  05/03/2021 FINDINGS: Decreased left pleural effusion with small moderate left residual. There is also probably a small right pleural effusion. Cardiomegaly with aortic atherosclerosis. Possible small left apical pneumothorax. IMPRESSION: 1. Decreased left pleural effusion post thoracentesis with small moderate residual pleural effusion. Possible small left apical pneumothorax. 2. Cardiomegaly.  Probable  small right pleural effusion. Critical Value/emergent results were called by telephone at the time of interpretation on 05/04/2021 at 4:27 pm to provider Dr. Olevia Bowens, Who verbally acknowledged these results. Electronically Signed   By: Donavan Foil M.D.   On: 05/04/2021 16:27   DG Chest 2 View  Result Date: 05/03/2021 CLINICAL DATA:  Cough with decreased breath sounds at the left lung base. Symptoms for 2-3 weeks. EXAM: CHEST - 2 VIEW COMPARISON:  Radiographs 08/12/2020.  CT 05/20/2018. FINDINGS: A previously demonstrated left pleural effusion has enlarged, and there is increasing opacity at the left lung base. The right lung appears clear. There is no significant right-sided pleural effusion or pneumothorax. The heart appears enlarged. Aortic atherosclerosis noted. There are stable degenerative changes in the spine. IMPRESSION: Enlarged moderate-sized left pleural effusion with left lower lobe consolidation or mass. If not previously performed, consider thoracentesis for further evaluation. Follow-up necessary to exclude underlying malignancy. Electronically Signed   By: Caryl Comes.D.  On: 05/03/2021 16:09   DG CHEST PORT 1 VIEW  Result Date: 05/05/2021 CLINICAL DATA:  80 year old female with history of pneumothorax. EXAM: PORTABLE CHEST 1 VIEW COMPARISON:  Chest x-ray 05/04/2021. FINDINGS: Lung volumes are low. Patchy multifocal ill-defined opacities throughout the left mid to lower lung, and at the right lung base. Complete obscuration of the left hemidiaphragm. Small right and moderate left pleural effusions. No pneumothorax. Cephalization of the pulmonary vasculature, without frank pulmonary edema. Heart size appears enlarged. Upper mediastinal contours are distorted by patient positioning. Atherosclerotic calcifications in the thoracic aorta. IMPRESSION: 1. Findings are concerning for multilobar bilateral pneumonia (left greater than right). 2. Small right and moderate left pleural effusions. 3.  Cardiomegaly. 4. Aortic atherosclerosis. Electronically Signed   By: Vinnie Langton M.D.   On: 05/05/2021 06:15   US THORACENTESIS ASP PLEURAL SPACE W/IMG GUIDE  Result Date: 05/04/2021 INDICATION: Patient presented to the ED with shortness of breath and found to have a left pleural effusion. Interventional radiology asked to perform a diagnostic and therapeutic thoracentesis. EXAM: ULTRASOUND GUIDED THORACENTESIS MEDICATIONS: 1% lidocaine 10 mL COMPLICATIONS: SIR Level A - No therapy, no consequence. PROCEDURE: An ultrasound guided thoracentesis was thoroughly discussed with the patient and questions answered. The benefits, risks, alternatives and complications were also discussed. The patient understands and wishes to proceed with the procedure. Written consent was obtained. Ultrasound was performed to localize and mark an adequate pocket of fluid in the left chest. The area was then prepped and draped in the normal sterile fashion. 1% Lidocaine was used for local anesthesia. Under ultrasound guidance a 6 Fr Safe-T-Centesis catheter was introduced. Thoracentesis was performed. The catheter was removed and a dressing applied. FINDINGS: A total of approximately 850 mL of clear yellow fluid was removed. Samples were sent to the laboratory as requested by the clinical team. IMPRESSION: Successful ultrasound guided left thoracentesis yielding 850 mL of pleural fluid. Possible small pneumothorax identified on post-procedure chest x-ray. No intervention needed at this time. Read by: Soyla Dryer, NP Electronically Signed   By: Jacqulynn Cadet M.D.   On: 05/04/2021 16:41      Flora Lipps, MD  [facility] 05/05/2021  If 7PM-7AM, please contact night-coverage

## 2021-05-05 NOTE — Plan of Care (Signed)
  Problem: Education: Goal: Knowledge of General Education information will improve Description: Including pain rating scale, medication(s)/side effects and non-pharmacologic comfort measures Outcome: Progressing   Problem: Clinical Measurements: Goal: Will remain free from infection Outcome: Progressing Goal: Diagnostic test results will improve Outcome: Progressing Goal: Respiratory complications will improve Outcome: Progressing

## 2021-05-05 NOTE — ED Notes (Signed)
Lunch tray given. 

## 2021-05-06 LAB — CBC
HCT: 30.4 % — ABNORMAL LOW (ref 36.0–46.0)
Hemoglobin: 9.4 g/dL — ABNORMAL LOW (ref 12.0–15.0)
MCH: 25.8 pg — ABNORMAL LOW (ref 26.0–34.0)
MCHC: 30.9 g/dL (ref 30.0–36.0)
MCV: 83.3 fL (ref 80.0–100.0)
Platelets: 202 10*3/uL (ref 150–400)
RBC: 3.65 MIL/uL — ABNORMAL LOW (ref 3.87–5.11)
RDW: 14 % (ref 11.5–15.5)
WBC: 10.2 10*3/uL (ref 4.0–10.5)
nRBC: 0 % (ref 0.0–0.2)

## 2021-05-06 LAB — BASIC METABOLIC PANEL
Anion gap: 8 (ref 5–15)
BUN: 45 mg/dL — ABNORMAL HIGH (ref 8–23)
CO2: 29 mmol/L (ref 22–32)
Calcium: 8.8 mg/dL — ABNORMAL LOW (ref 8.9–10.3)
Chloride: 97 mmol/L — ABNORMAL LOW (ref 98–111)
Creatinine, Ser: 1.5 mg/dL — ABNORMAL HIGH (ref 0.44–1.00)
GFR, Estimated: 35 mL/min — ABNORMAL LOW (ref >60)
Glucose, Bld: 109 mg/dL — ABNORMAL HIGH (ref 70–99)
Potassium: 4.6 mmol/L (ref 3.5–5.1)
Sodium: 134 mmol/L — ABNORMAL LOW (ref 135–145)

## 2021-05-06 LAB — MAGNESIUM: Magnesium: 2.2 mg/dL (ref 1.7–2.4)

## 2021-05-06 NOTE — Plan of Care (Signed)

## 2021-05-06 NOTE — Plan of Care (Signed)

## 2021-05-06 NOTE — Progress Notes (Signed)
PT Cancellation Note  Patient Details Name: [name] MRN: [id number] DOB: [birth year]   Cancelled Treatment:    Reason Eval/Treat Not Completed:  Attempted PT eval-pt declined to participate at this time. Will check back as schedule allows.    [facility]  Office: [phone] Pager: [phone]

## 2021-05-06 NOTE — Progress Notes (Signed)
PROGRESS NOTE  [name] OTR:[id number] DOB: 1941/06/13 DOA: 05/04/2021 PCP: [name], MD   LOS: 1 day   Brief narrative: [name] is a 80 y.o. 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  Patient does have chronic abdominal pain which was unchanged.  In the ED WBC was 9.3.  Troponin was negative.  Creatinine was slightly elevated at 1.5.  Chest x-ray showed moderate left-sided effusion with left lower lobe consolidation or mass.  Patient had elevated blood pressure and pulse ox was 91%.  Patient underwent thoracocentesis by interventional radiology in the ED for left-sided pleural effusion and was admitted hospital for further evaluation and treatment.  Assessment/Plan:  Principal Problem:   Pleural effusion on left Active Problems:   Gout   HTN (hypertension)   Chronic kidney disease, stage 3b (HCC)   CHF (congestive heart failure), NYHA class III, chronic, diastolic (HCC)   Chronic respiratory failure with hypoxia (HCC)   Normocytic anemia   Pleural effusion  Acute on chronic respiratory failure with hypoxia with left pleural effusion  Patient underwent ultrasound-guided thoracocentesis on 05/04/2021 with removal of 850 mL of pleural fluid.  Status post removal with improvement in symptoms.  Chest x-ray shows evidence of consolidation has been initiated on IV antibiotic with Rocephin and Zithromax.  We will continue IV antibiotics for 1 to 2 days..  Pleural fluid but no organisms seen in gram stain.  Fluid culture negative in less than 24 hours.   Continue inhalers.    Gout Continue allopurinol   Essential hypertension  Continue metoprolol, isosorbide and hydralazine.  Continue to monitor blood pressure.     Chronic kidney disease, stage 3b Continue to monitor creatinine levels.  Latest creatinine of 1.5.     CHF congestive heart failure), NYHA class III, chronic, diastolic On metoprolol,, torsemide.  Continue strict intake and output charting, daily weights.  BNP at 34 .     Normocytic anemia On iron supplementation.  Latest hemoglobin of 9.4  Debility deconditioning.  PT evaluation pending.    DVT prophylaxis: enoxaparin (LOVENOX) injection 30 mg Start: 05/04/21 2200  Code Status: Full code  Family Communication: None today.  Spoke with the  patient's granddaughter at bedside yesterday.  Status is: Inpatient  Remains inpatient appropriate because:Unsafe d/c plan, IV treatments appropriate due to intensity of illness or inability to take PO, and Inpatient level of care appropriate due to severity of illness  Dispo: The patient is from: Home              Anticipated d/c is to: Home              Patient currently is not medically stable to d/c.   Difficult to place patient No  Consultants: Interventional radiology  Procedures: Ultrasound guided left-sided thoracocentesis on 05/04/2021  Anti-infectives:  Rocephin and Zithromax 10/7>  Anti-infectives (From admission, onward)    Start     Dose/Rate Route Frequency Ordered Stop   05/05/21 1600  cefTRIAXone (ROCEPHIN) 1 g in sodium chloride 0.9 % 100 mL IVPB        1 g 200 mL/hr over 30 Minutes Intravenous Daily 05/05/21 1425     05/05/21 1600  azithromycin (ZITHROMAX) 500 mg in sodium chloride 0.9 % 250 mL IVPB        500 mg 250 mL/hr over 60 Minutes Intravenous Daily 05/05/21 1425        Subjective: Today, patient was seen and examined at bedside.  Patient feels a little better with breathing.  Denies overt pain, nausea, vomiting  Objective: Vitals:    05/06/21 0838 05/06/21 1119  BP:  (!) 149/45  Pulse:  (!) 59  Resp:  19  Temp:  98.4 F (36.9 C)  SpO2: 96% 99%    Intake/Output Summary (Last 24 hours) at 05/06/2021 1438 Last data filed at 05/06/2021 1426 Gross per 24 hour  Intake 2940 ml  Output 850 ml  Net 2090 ml   Filed Weights   05/05/21 1347  Weight: 81.3 kg   Body mass index is 35 kg/m.   Physical Exam: GENERAL: Patient is alert awake and communicative, chronically ill-appearing, on nasal cannula, obese HENT: No scleral pallor or icterus. Pupils equally reactive to light. Oral mucosa is moist NECK: is supple, no gross swelling noted. CHEST: Diminished breath sounds' mostly on the left side. CVS: S1 and S2 heard, no murmur. Regular rate and rhythm.  ABDOMEN: Soft, non-tender, bowel sounds are present.  Colostomy and ventral hernia in place.  Nonspecific tenderness EXTREMITIES: Left lower extremity pretibial venous stasis  CNS: Cranial nerves are intact. No focal motor deficits. SKIN: warm and dry, venous stasis bilateral lower extremity  Data Review: I have personally reviewed the following laboratory data and studies,  CBC: Recent Labs  Lab 05/04/21 1025 05/06/21 0355  WBC 9.3 10.2  NEUTROABS 7.2  --   HGB 10.6* 9.4*  HCT 34.2* 30.4*  MCV 84.4 83.3  PLT 220 510    Basic Metabolic Panel: Recent Labs  Lab 05/04/21 1025 05/06/21 0355  NA 142 134*  K 4.5 4.6  CL 105 97*  CO2 32 29  GLUCOSE 114* 109*  BUN 31* 45*  CREATININE 1.57* 1.50*  CALCIUM 10.0 8.8*  MG  --  2.2    Liver Function Tests: Recent Labs  Lab 05/04/21 1025  AST 16  ALT 10  ALKPHOS 59  BILITOT 0.5  PROT 8.0  ALBUMIN 3.7    No results for input(s): LIPASE, AMYLASE in the last 168 hours. No results for input(s): AMMONIA in the last 168 hours. Cardiac Enzymes: No results for input(s): CKTOTAL, CKMB, CKMBINDEX, TROPONINI in the last 168 hours. BNP (last 3 results) Recent Labs    05/04/21 1023  BNP 34.9     ProBNP  (last 3 results) No results for input(s): PROBNP in the last 8760 hours.  CBG: No results for input(s): GLUCAP in the last 168 hours. Recent Results (from the past 240 hour(s))  Resp Panel by RT-PCR (Flu A&B, Covid) Nasopharyngeal Swab     Status: None   Collection Time: 05/04/21 11:03 AM   Specimen: Nasopharyngeal Swab; Nasopharyngeal(NP) swabs in vial transport medium  Result Value Ref Range Status   SARS Coronavirus 2 by RT PCR NEGATIVE NEGATIVE Final    Comment: (NOTE) SARS-CoV-2 target nucleic acids are NOT DETECTED.  The SARS-CoV-2 RNA is generally detectable in upper respiratory specimens during the acute phase of infection. The lowest concentration of SARS-CoV-2 viral copies this assay can detect is 138 copies/mL. A negative result does not preclude SARS-Cov-2 infection and should not be used as the sole basis for treatment or other patient management decisions. A negative result may occur with  improper specimen  collection/handling, submission of specimen other than nasopharyngeal swab, presence of viral mutation(s) within the areas targeted by this assay, and inadequate number of viral copies(<138 copies/mL). A negative result must be combined with clinical observations, patient history, and epidemiological information. The expected result is Negative.  Fact Sheet for Patients:  EntrepreneurPulse.com.au  Fact Sheet for Healthcare Providers:  IncredibleEmployment.be  This test is no t yet approved or cleared by the [place] FDA and  has been authorized for detection and/or diagnosis of SARS-CoV-2 by FDA under an Emergency Use Authorization (EUA). This EUA will remain  in effect (meaning this test can be used) for the duration of the COVID-19 declaration under Section 564(b)(1) of the Act, 21 U.S.C.section 360bbb-3(b)(1), unless the authorization is terminated  or revoked sooner.       Influenza A by PCR NEGATIVE NEGATIVE  Final   Influenza B by PCR NEGATIVE NEGATIVE Final    Comment: (NOTE) The Xpert Xpress SARS-CoV-2/FLU/RSV plus assay is intended as an aid in the diagnosis of influenza from Nasopharyngeal swab specimens and should not be used as a sole basis for treatment. Nasal washings and aspirates are unacceptable for Xpert Xpress SARS-CoV-2/FLU/RSV testing.  Fact Sheet for Patients: EntrepreneurPulse.com.au  Fact Sheet for Healthcare Providers: IncredibleEmployment.be  This test is not yet approved or cleared by the [place] FDA and has been authorized for detection and/or diagnosis of SARS-CoV-2 by FDA under an Emergency Use Authorization (EUA). This EUA will remain in effect (meaning this test can be used) for the duration of the COVID-19 declaration under Section 564(b)(1) of the Act, 21 U.S.C. section 360bbb-3(b)(1), unless the authorization is terminated or revoked.  Performed at [facility], [place] [address] [place], [place] [address]   Culture, body fluid w Gram Stain-bottle     Status: None (Preliminary result)   Collection Time: 05/04/21  3:15 PM   Specimen: Pleura  Result Value Ref Range Status   Specimen Description PLEURAL  Final   Special Requests BOTTLES DRAWN AEROBIC AND ANAEROBIC  Final   Culture   Final    NO GROWTH < 24 HOURS Performed at [facility], [place] [address] [place], [place] [address]    Report Status PENDING  Incomplete  Gram stain     Status: None   Collection Time: 05/04/21  3:15 PM   Specimen: Pleura  Result Value Ref Range Status   Specimen Description PLEURAL  Final   Special Requests NONE  Final   Gram Stain   Final    WBC PRESENT,BOTH PMN AND MONONUCLEAR NO ORGANISMS SEEN CYTOSPIN SMEAR Performed at [facility], [address] [place], [place] [address]    Report Status 05/05/2021 FINAL  Final      Studies: DG Chest 1 View  Result Date: 05/04/2021 CLINICAL DATA:   Post thoracentesis EXAM: CHEST  1 VIEW COMPARISON:  05/03/2021 FINDINGS: Decreased left pleural effusion with small moderate left residual. There is also probably a small right pleural effusion. Cardiomegaly with aortic atherosclerosis. Possible small left apical pneumothorax. IMPRESSION: 1. Decreased left pleural effusion post thoracentesis with small moderate residual pleural effusion. Possible small left apical pneumothorax. 2. Cardiomegaly.  Probable small right pleural effusion. Critical Value/emergent results were called by telephone at the time of interpretation on 05/04/2021 at 4:27 pm to provider Dr. Olevia Bowens, Who verbally acknowledged these results. Electronically Signed   By: Donavan Foil M.D.   On: 05/04/2021 16:27   DG CHEST PORT 1 VIEW  Result Date:  05/05/2021 CLINICAL DATA:  80 year old female with history of pneumothorax. EXAM: PORTABLE CHEST 1 VIEW COMPARISON:  Chest x-ray 05/04/2021. FINDINGS: Lung volumes are low. Patchy multifocal ill-defined opacities throughout the left mid to lower lung, and at the right lung base. Complete obscuration of the left hemidiaphragm. Small right and moderate left pleural effusions. No pneumothorax. Cephalization of the pulmonary vasculature, without frank pulmonary edema. Heart size appears enlarged. Upper mediastinal contours are distorted by patient positioning. Atherosclerotic calcifications in the thoracic aorta. IMPRESSION: 1. Findings are concerning for multilobar bilateral pneumonia (left greater than right). 2. Small right and moderate left pleural effusions. 3. Cardiomegaly. 4. Aortic atherosclerosis. Electronically Signed   By: Vinnie Langton M.D.   On: 05/05/2021 06:15   US THORACENTESIS ASP PLEURAL SPACE W/IMG GUIDE  Result Date: 05/04/2021 INDICATION: Patient presented to the ED with shortness of breath and found to have a left pleural effusion. Interventional radiology asked to perform a diagnostic and therapeutic thoracentesis. EXAM:  ULTRASOUND GUIDED THORACENTESIS MEDICATIONS: 1% lidocaine 10 mL COMPLICATIONS: SIR Level A - No therapy, no consequence. PROCEDURE: An ultrasound guided thoracentesis was thoroughly discussed with the patient and questions answered. The benefits, risks, alternatives and complications were also discussed. The patient understands and wishes to proceed with the procedure. Written consent was obtained. Ultrasound was performed to localize and mark an adequate pocket of fluid in the left chest. The area was then prepped and draped in the normal sterile fashion. 1% Lidocaine was used for local anesthesia. Under ultrasound guidance a 6 Fr Safe-T-Centesis catheter was introduced. Thoracentesis was performed. The catheter was removed and a dressing applied. FINDINGS: A total of approximately 850 mL of clear yellow fluid was removed. Samples were sent to the laboratory as requested by the clinical team. IMPRESSION: Successful ultrasound guided left thoracentesis yielding 850 mL of pleural fluid. Possible small pneumothorax identified on post-procedure chest x-ray. No intervention needed at this time. Read by: Soyla Dryer, NP Electronically Signed   By: Jacqulynn Cadet M.D.   On: 05/04/2021 16:41      Flora Lipps, MD  [facility] 05/06/2021  If 7PM-7AM, please contact night-coverage

## 2021-05-07 MED ORDER — GUAIFENESIN-DM 100-10 MG/5ML PO SYRP
5.0000 mL | ORAL_SOLUTION | ORAL | Status: DC | PRN
  Administered 2021-05-07 – 2021-05-08 (×2): 5 mL via ORAL
  Filled 2021-05-07 (×2): qty 10

## 2021-05-07 NOTE — Plan of Care (Signed)
  Problem: Education: Goal: Knowledge of General Education information will improve Description: Including pain rating scale, medication(s)/side effects and non-pharmacologic comfort measures Outcome: Progressing   Problem: Clinical Measurements: Goal: Will remain free from infection Outcome: Progressing Goal: Diagnostic test results will improve Outcome: Progressing Goal: Respiratory complications will improve Outcome: Progressing

## 2021-05-07 NOTE — Evaluation (Signed)
Physical Therapy Evaluation Patient Details Name: [name] MRN: [id number] DOB: [birth year] Today's Date: 05/07/2021  History of Present Illness  80 y.o. female referred to ED by PCP after L chest radiograph at showed L pleural effusion and worsening of chronic respiratory failure post COVID-19. Admitted 10/6 for Pleural effusion on L. PMH significant for COPD, CHF, HTN, OSA.  Clinical Impression  On eval, pt required Mod A to stand x 2 and perform pre-gait marching. Pt presents with general weakness, decreased activity tolerance, and impaired gait and balance. Pt fatigues easily with minimal activity. She is at risk for falls. PT recommendation is for ST SNF for continued rehab if pt is agreeable.        Recommendations for follow up therapy are one component of a multi-disciplinary discharge planning process, led by the attending physician.  Recommendations may be updated based on patient status, additional functional criteria and insurance authorization.  Follow Up Recommendations SNF    Equipment Recommendations  None recommended by PT    Recommendations for Other Services       Precautions / Restrictions Precautions Precautions: Fall Precaution Comments: incontinent Restrictions Weight Bearing Restrictions: No      Mobility  Bed Mobility Overal bed mobility: Needs Assistance Bed Mobility: Sit to Supine       Sit to supine: Mod assist;HOB elevated   General bed mobility comments: oob in recliner    Transfers Overall transfer level: Needs assistance Equipment used: Rolling walker (2 wheeled) Transfers: Sit to/from Stand Sit to Stand: Mod assist Stand pivot transfers: +2 safety/equipment;Mod assist       General transfer comment: x2. Assist to power up, steady, control descent. Posterior bias with near LOB with initial standing. Cues for safety, technique, hand placement.  Ambulation/Gait Ambulation/Gait assistance: Mod assist;+2 safety/equipment    Assistive device: Rolling walker (2 wheeled)       General Gait Details: Pre-gait marching in place x 3 reps each leg. Assist to stabilize pt during weightshifting. Fatigues easily.  Stairs            Wheelchair Mobility    Modified Rankin (Stroke Patients Only)       Balance Overall balance assessment: Needs assistance         Standing balance support: Bilateral upper extremity supported Standing balance-Leahy Scale: Poor                               Pertinent Vitals/Pain Pain Assessment: Faces Faces Pain Scale: Hurts even more Pain Location: back, abd, bil LEs/feet Pain Descriptors / Indicators: Discomfort;Sore;Grimacing Pain Intervention(s): Limited activity within patient's tolerance;Monitored during session;Repositioned;Heat applied    Home Living Family/patient expects to be discharged to:: Private residence Living Arrangements: Spouse/significant other;Children Available Help at Discharge: Family;Available 24 hours/day Type of Home: House Home Access: Stairs to enter Entrance Stairs-Rails: None Entrance Stairs-Number of Steps: 1 Home Layout: One level Home Equipment: Bedside commode;Grab bars - tub/shower;Walker - 4 wheels;Cane - single point      Prior Function           Comments: Pt performing caregiver role, cooking, and medication managment for daughter who has had a CVA. Performs IADLs for husband. Grandaughter assists with her daughter and is while she is in hospital. Pt was Independent in ADLs/IADLs ,used rollator for functional mobility. On 2L of continuous O2 at home.     Hand Dominance   Dominant Hand: Right    Extremity/Trunk Assessment  Upper Extremity Assessment Upper Extremity Assessment: Defer to OT evaluation RUE Deficits / Details: 3+/5 shoulder flexion, 3+/5 elbow extension and grip strength. LUE Deficits / Details: 3+/5 shoulder flexion, 3+/5 elbow extension and grip strength.    Lower Extremity  Assessment Lower Extremity Assessment: Generalized weakness    Cervical / Trunk Assessment Cervical / Trunk Assessment: Normal  Communication   Communication: No difficulties  Cognition Arousal/Alertness: Awake/alert Behavior During Therapy: WFL for tasks assessed/performed Overall Cognitive Status: Within Functional Limits for tasks assessed                                 General Comments: Pt followed multistep commands.      General Comments      Exercises     Assessment/Plan    PT Assessment Patient needs continued PT services  PT Problem List Decreased strength;Decreased mobility;Decreased activity tolerance;Decreased balance;Decreased knowledge of use of DME;Pain;Decreased skin integrity;Obesity       PT Treatment Interventions DME instruction;Gait training;Therapeutic exercise;Balance training;Functional mobility training;Therapeutic activities;Patient/family education    PT Goals (Current goals can be found in the Care Plan section)  Acute Rehab PT Goals Patient Stated Goal: To get better, to go home PT Goal Formulation: With patient Time For Goal Achievement: 05/21/21 Potential to Achieve Goals: Good    Frequency Min 3X/week   Barriers to discharge Decreased caregiver support      Co-evaluation               AM-PAC PT "6 Clicks" Mobility  Outcome Measure Help needed turning from your back to your side while in a flat bed without using bedrails?: A Lot Help needed moving from lying on your back to sitting on the side of a flat bed without using bedrails?: A Lot Help needed moving to and from a bed to a chair (including a wheelchair)?: A Lot Help needed standing up from a chair using your arms (e.g., wheelchair or bedside chair)?: A Lot Help needed to walk in hospital room?: A Lot Help needed climbing 3-5 steps with a railing? : Total 6 Click Score: 11    End of Session Equipment Utilized During Treatment: Gait belt Activity  Tolerance: Patient limited by fatigue;Patient limited by pain Patient left: in chair;with call bell/phone within reach   PT Visit Diagnosis: Pain;Muscle weakness (generalized) (M62.81);Difficulty in walking, not elsewhere classified (R26.2) Pain - part of body: Leg;Ankle and joints of foot (bil)    Time: 9622-2979 PT Time Calculation (min) (ACUTE ONLY): 22 min   Charges:   PT Evaluation $PT Eval Moderate Complexity: 1 Mod            Doreatha Massed, PT Acute Rehabilitation  Office: [phone] Pager: [phone]

## 2021-05-07 NOTE — Progress Notes (Signed)
PROGRESS NOTE  [name] GXQ:[id number] DOB: August 09, 1940 DOA: 05/04/2021 PCP: [name], MD   LOS: 2 days   Brief narrative: [name] is a 80 y.o. 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  Patient does have chronic abdominal pain which was unchanged.  In the ED WBC was 9.3.  Troponin was negative.  Creatinine was slightly elevated at 1.5.  Chest x-ray showed moderate left-sided effusion with left lower lobe consolidation or mass.  Patient had elevated blood pressure and pulse ox was 91%.  Patient underwent thoracocentesis by interventional radiology in the ED for left-sided pleural effusion and was admitted hospital for further evaluation and treatment.  Assessment/Plan:  Principal Problem:   Pleural effusion on left Active Problems:   Gout   HTN (hypertension)   Chronic kidney disease, stage 3b (HCC)   CHF (congestive heart failure), NYHA class III, chronic, diastolic (HCC)   Chronic respiratory failure with hypoxia (HCC)   Normocytic anemia   Pleural effusion  Acute on chronic respiratory failure with hypoxia with left pleural effusion  Patient underwent ultrasound-guided thoracocentesis on 05/04/2021 with removal of 850 mL of pleural fluid.  Status post removal with improvement in symptoms.  Chest x-ray showed evidence of consolidation. Patient has been initiated on IV antibiotic with Rocephin and Zithromax.  We will continue IV antibiotics for  next 1 to 2 days..  Pleural fluid but no organisms seen in gram stain.  Fluid culture negative in less  than 24 hours.  Continue inhalers.    Gout Continue allopurinol   Essential hypertension  Continue metoprolol, isosorbide and hydralazine.  Continue to monitor blood pressure.     Chronic kidney disease, stage 3b Continue to monitor creatinine levels.  Latest creatinine of 1.5.     CHF congestive heart failure), NYHA class III, chronic, diastolic On metoprolol,, torsemide.  Continue strict intake and output charting, daily weights.  BNP at 34 .     Normocytic anemia On iron supplementation.  Latest hemoglobin of 9.4  Debility deconditioning.  PT evaluation pending.    DVT prophylaxis: enoxaparin (LOVENOX) injection 30 mg Start: 05/04/21 2200  Code Status: Full code  Family Communication:  None today.  Status is: Inpatient  Remains inpatient appropriate because:Unsafe d/c plan, IV treatments appropriate due to intensity of illness or inability to take PO, and Inpatient level of care appropriate due to severity of illness  Dispo: The patient is from: Home              Anticipated d/c is to: Skilled nursing facility as per PT recommendation.  Consult TOC              Patient currently is not medically stable to d/c.   Difficult to place patient No  Consultants: Interventional radiology  Procedures: Ultrasound guided left-sided thoracocentesis on 05/04/2021  Anti-infectives:  Rocephin and Zithromax 10/7>  Anti-infectives (From admission, onward)    Start     Dose/Rate Route Frequency Ordered Stop   05/05/21 1600  cefTRIAXone (ROCEPHIN) 1 g in sodium chloride 0.9 % 100 mL IVPB        1 g 200 mL/hr over 30 Minutes Intravenous Daily 05/05/21 1425  05/05/21 1600  azithromycin (ZITHROMAX) 500 mg in sodium chloride 0.9 % 250 mL IVPB        500 mg 250 mL/hr over 60 Minutes Intravenous Daily 05/05/21 1425        Subjective: Today complains of mild chest discomfort at the site of pleural tapping.  Patient denies any nausea, vomiting, fever or chills.  Nursing staff  reported that she has been requiring a lot of assistance  Objective: Vitals:   05/07/21 0854 05/07/21 0855  BP:    Pulse:    Resp:    Temp:    SpO2: 95% 95%    Intake/Output Summary (Last 24 hours) at 05/07/2021 1455 Last data filed at 05/07/2021 1130 Gross per 24 hour  Intake 550 ml  Output 2251 ml  Net -1701 ml    Filed Weights   05/05/21 1347  Weight: 81.3 kg   Body mass index is 35 kg/m.   Physical Exam: General: Obese built, not in obvious distress, chronically ill, on nasal cannula oxygen, HENT:   No scleral pallor or icterus noted. Oral mucosa is moist.  Chest:   Diminished breath sounds bilaterally.  CVS: S1 &S2 heard. No murmur.  Regular rate and rhythm. Abdomen: Soft, nontender, nondistended.  Bowel sounds are heard.  Colostomy and ventral hernia noted with nonspecific tenderness Extremities: No cyanosis, clubbing right lower extremity pretibial venous stasis..  Peripheral pulses are palpable. Psych: Alert, awake and oriented, normal mood CNS:  No cranial nerve deficits.  Power equal in all extremities.   Skin: Warm and dry.  Venous stasis noted over the bilateral lower extremities.  Data Review: I have personally reviewed the following laboratory data and studies,  CBC: Recent Labs  Lab 05/04/21 1025 05/06/21 0355  WBC 9.3 10.2  NEUTROABS 7.2  --   HGB 10.6* 9.4*  HCT 34.2* 30.4*  MCV 84.4 83.3  PLT 220 496    Basic Metabolic Panel: Recent Labs  Lab 05/04/21 1025 05/06/21 0355  NA 142 134*  K 4.5 4.6  CL 105 97*  CO2 32 29  GLUCOSE 114* 109*  BUN 31* 45*  CREATININE 1.57* 1.50*  CALCIUM 10.0 8.8*  MG  --  2.2    Liver Function Tests: Recent Labs  Lab 05/04/21 1025  AST 16  ALT 10  ALKPHOS 59  BILITOT 0.5  PROT 8.0  ALBUMIN 3.7    No results for input(s): LIPASE, AMYLASE in the last 168 hours. No results for input(s): AMMONIA in the last 168 hours. Cardiac Enzymes: No results for input(s): CKTOTAL, CKMB, CKMBINDEX, TROPONINI  in the last 168 hours. BNP (last 3 results) Recent Labs    05/04/21 1023  BNP 34.9     ProBNP (last 3 results) No results for input(s): PROBNP in the last 8760 hours.  CBG: No results for input(s): GLUCAP in the last 168 hours. Recent Results (from the past 240 hour(s))  Resp Panel by RT-PCR (Flu A&B, Covid) Nasopharyngeal Swab     Status: None   Collection Time: 05/04/21 11:03 AM   Specimen: Nasopharyngeal Swab; Nasopharyngeal(NP) swabs in vial transport medium  Result Value Ref Range Status   SARS Coronavirus 2 by RT PCR NEGATIVE NEGATIVE Final    Comment: (NOTE) SARS-CoV-2 target nucleic acids are NOT DETECTED.  The SARS-CoV-2 RNA is generally detectable in upper respiratory specimens during the acute phase of infection. The lowest concentration of SARS-CoV-2 viral copies this assay can detect is 138 copies/mL. A negative result does not preclude SARS-Cov-2 infection  and should not be used as the sole basis for treatment or other patient management decisions. A negative result may occur with  improper specimen collection/handling, submission of specimen other than nasopharyngeal swab, presence of viral mutation(s) within the areas targeted by this assay, and inadequate number of viral copies(<138 copies/mL). A negative result must be combined with clinical observations, patient history, and epidemiological information. The expected result is Negative.  Fact Sheet for Patients:  EntrepreneurPulse.com.au  Fact Sheet for Healthcare Providers:  IncredibleEmployment.be  This test is no t yet approved or cleared by the [place] FDA and  has been authorized for detection and/or diagnosis of SARS-CoV-2 by FDA under an Emergency Use Authorization (EUA). This EUA will remain  in effect (meaning this test can be used) for the duration of the COVID-19 declaration under Section 564(b)(1) of the Act, 21 U.S.C.section 360bbb-3(b)(1), unless  the authorization is terminated  or revoked sooner.       Influenza A by PCR NEGATIVE NEGATIVE Final   Influenza B by PCR NEGATIVE NEGATIVE Final    Comment: (NOTE) The Xpert Xpress SARS-CoV-2/FLU/RSV plus assay is intended as an aid in the diagnosis of influenza from Nasopharyngeal swab specimens and should not be used as a sole basis for treatment. Nasal washings and aspirates are unacceptable for Xpert Xpress SARS-CoV-2/FLU/RSV testing.  Fact Sheet for Patients: EntrepreneurPulse.com.au  Fact Sheet for Healthcare Providers: IncredibleEmployment.be  This test is not yet approved or cleared by the [place] FDA and has been authorized for detection and/or diagnosis of SARS-CoV-2 by FDA under an Emergency Use Authorization (EUA). This EUA will remain in effect (meaning this test can be used) for the duration of the COVID-19 declaration under Section 564(b)(1) of the Act, 21 U.S.C. section 360bbb-3(b)(1), unless the authorization is terminated or revoked.  Performed at [facility], [place] [address], [place], [place] [address]   Culture, body fluid w Gram Stain-bottle     Status: None (Preliminary result)   Collection Time: 05/04/21  3:15 PM   Specimen: Pleura  Result Value Ref Range Status   Specimen Description PLEURAL  Final   Special Requests BOTTLES DRAWN AEROBIC AND ANAEROBIC  Final   Culture   Final    NO GROWTH 3 DAYS Performed at [facility], [place] [address], [place], [place] [address]    Report Status PENDING  Incomplete  Gram stain     Status: None   Collection Time: 05/04/21  3:15 PM   Specimen: Pleura  Result Value Ref Range Status   Specimen Description PLEURAL  Final   Special Requests NONE  Final   Gram Stain   Final    WBC PRESENT,BOTH PMN AND MONONUCLEAR NO ORGANISMS SEEN CYTOSPIN SMEAR Performed at [facility], [address], [place], [place] [address]    Report Status  05/05/2021 FINAL  Final      Studies: No results found.    Flora Lipps, MD  [facility] 05/07/2021  If 7PM-7AM, please contact night-coverage

## 2021-05-07 NOTE — Evaluation (Signed)
Occupational Therapy Evaluation Patient Details Name: [name] MRN: [id number] DOB: 03-Mar-1941 Today's Date: 05/07/2021   History of Present Illness 80 y.o. female referred to ED by PCP after L chest radiograph at showed L pleural effusion and worsening of chronic respiratory failure post COVID-19. Admitted 10/6 for Pleural effusion on L. PMH significant for COPD, CHF, HTN, OSA.   Clinical Impression   PTA, pt was living at home, independent in ADLs/IADLs with rollator, and caregiving for daughter and husband (see PLOF). Pt was also requiring 2L continuous O2 at home.Upon evaluation, pt with decreased activity tolerance, functional strength, balance, and pain limiting functional abilities. Pt currently set up for UB ADLs, Mod-Max for LB ADLs, and Max A +2 for stand-pivot transfers with rollator. SpO2 maintained above 96 % throughout session on 2L. Pt will benefit from skilled OT services while in hospital to improve deficits and learn compensatory strategies as needed in order to return to PLOF. Discussed short-term rehab with pt, pt would rather go home, but seemed open to rehab as a possibility. Recommending SNF rehab post d/c due to current level of function and decreased caregiver support.       Recommendations for follow up therapy are one component of a multi-disciplinary discharge planning process, led by the attending physician.  Recommendations may be updated based on patient status, additional functional criteria and insurance authorization.   Follow Up Recommendations  SNF    Equipment Recommendations   (TBD)    Recommendations for Other Services       Precautions / Restrictions Precautions Precautions: Fall Restrictions Weight Bearing Restrictions: No      Mobility Bed Mobility Overal bed mobility: Needs Assistance Bed Mobility: Sit to Supine       Sit to supine: Mod assist;HOB elevated   General bed mobility comments: Pt required assitt to pull up into  sitting and scoot to EOB.    Transfers Overall transfer level: Needs assistance Equipment used: Rolling walker (2 wheeled) Transfers: Stand Pivot Transfers   Stand pivot transfers: +2 physical assistance;+2 safety/equipment;From elevated surface;Max assist       General transfer comment: Pt required assist for power up, assist to stand upright while taking steps to pivot onto BSC, and guiding down onto [facility].    Balance                                           ADL either performed or assessed with clinical judgement   ADL Overall ADL's : Needs assistance/impaired Eating/Feeding: Sitting;Modified independent   Grooming: Set up;Sitting   Upper Body Bathing: Sitting;Set up   Lower Body Bathing: Moderate assistance;Sit to/from stand   Upper Body Dressing : Set up;Sitting   Lower Body Dressing: Sit to/from stand;Maximal assistance Lower Body Dressing Details (indicate cue type and reason): Pt unable to donn socks using figure four position, cannot reach down to feet due to pain and full ostomy bag. Reports using reacher at baseline for LB dressing. Able to slip on shoes when placed by feet. Toilet Transfer: Maximal assistance;+2 for physical assistance;RW;BSC Toilet Transfer Details (indicate cue type and reason): Pt required assist for power up, assist to stand upright while taking steps to pivot onto BSC, and guiding down onto [facility]. Toileting- Clothing Manipulation and Hygiene: Maximal assistance Toileting - Clothing Manipulation Details (indicate cue type and reason): Pt performed perineal care, needing assist for perianal care to  reach behind and balance self in standing     Functional mobility during ADLs: Max assistance;Rolling walker;+2 for safety/equipment;+2 for physical assistance       Vision Patient Visual Report: No change from baseline       Perception     Praxis      Pertinent Vitals/Pain Pain Assessment: Faces Faces Pain Scale: Hurts  even more Pain Location: abdomen and BLEs stiff/sore Pain Descriptors / Indicators: Sore Pain Intervention(s): Limited activity within patient's tolerance;Monitored during session     Hand Dominance Right   Extremity/Trunk Assessment Upper Extremity Assessment Upper Extremity Assessment: RUE deficits/detail;LUE deficits/detail RUE Deficits / Details: 3+/5 shoulder flexion, 3+/5 elbow extension and grip strength. LUE Deficits / Details: 3+/5 shoulder flexion, 3+/5 elbow extension and grip strength.   Lower Extremity Assessment Lower Extremity Assessment: Defer to PT evaluation   Cervical / Trunk Assessment Cervical / Trunk Assessment: Normal   Communication Communication Communication: No difficulties   Cognition Arousal/Alertness: Awake/alert Behavior During Therapy: WFL for tasks assessed/performed Overall Cognitive Status: Within Functional Limits for tasks assessed                                 General Comments: Pt followed multistep commands.   General Comments       Exercises     Shoulder Instructions      Home Living Family/patient expects to be discharged to:: Private residence Living Arrangements: Spouse/significant other;Children Available Help at Discharge: Family;Available 24 hours/day Type of Home: House Home Access: Stairs to enter [employer] of Steps: 1 Entrance Stairs-Rails: None Home Layout: One level     Bathroom Shower/Tub: Teacher, early years/pre: Standard     Home Equipment: Bedside commode;Grab bars - tub/shower;Walker - 4 wheels;Cane - single point;sock-aid          Prior Functioning/Environment          Comments: Pt performing caregiver role, cooking, and medication managment for daughter who has had a CVA. Performs IADLs for husband. Grandaughter assists with her daughter and is while she is in hospital. Pt was Independent in ADLs/IADLs ,used rollator for functional mobility. On 2L of  continuous O2 at home.        OT Problem List: Decreased strength;Decreased activity tolerance;Decreased range of motion;Impaired balance (sitting and/or standing);Decreased knowledge of use of DME or AE;Pain      OT Treatment/Interventions: Self-care/ADL training;Therapeutic exercise;DME and/or AE instruction;Therapeutic activities;Patient/family education;Balance training    OT Goals(Current goals can be found in the care plan section) Acute Rehab OT Goals Patient Stated Goal: To get better, to go home OT Goal Formulation: With patient Time For Goal Achievement: 05/21/21 Potential to Achieve Goals: Good  OT Frequency: Min 2X/week   Barriers to D/C: Decreased caregiver support  Pts husband has dementia, daughter with previous CVA.       Co-evaluation              AM-PAC OT "6 Clicks" Daily Activity     Outcome Measure Help from another person eating meals?: None Help from another person taking care of personal grooming?: A Little Help from another person toileting, which includes using toliet, bedpan, or urinal?: A Lot Help from another person bathing (including washing, rinsing, drying)?: A Lot Help from another person to put on and taking off regular upper body clothing?: A Little Help from another person to put on and taking off regular lower body clothing?: A Lot  6 Click Score: 16   End of Session Equipment Utilized During Treatment: Oxygen (2L) Nurse Communication: Mobility status  Activity Tolerance: Patient tolerated treatment well Patient left: with chair alarm set;in chair;with call bell/phone within reach  OT Visit Diagnosis: Unsteadiness on feet (R26.81);Other abnormalities of gait and mobility (R26.89);Pain;Muscle weakness (generalized) (M62.81)                Time: 8316-7425 OT Time Calculation (min): 38 min Charges:  OT General Charges $OT Visit: 1 Visit OT Evaluation $OT Eval Low Complexity: 1 Low OT Treatments $Self Care/Home Management : 23-37  mins  Jackquline Denmark, OTS Acute Rehab Office: [phone]   [name] 05/07/2021, 12:01 PM

## 2021-05-08 LAB — BASIC METABOLIC PANEL
Anion gap: 8 (ref 5–15)
BUN: 51 mg/dL — ABNORMAL HIGH (ref 8–23)
CO2: 30 mmol/L (ref 22–32)
Calcium: 9.6 mg/dL (ref 8.9–10.3)
Chloride: 105 mmol/L (ref 98–111)
Creatinine, Ser: 1.79 mg/dL — ABNORMAL HIGH (ref 0.44–1.00)
GFR, Estimated: 28 mL/min — ABNORMAL LOW (ref >60)
Glucose, Bld: 136 mg/dL — ABNORMAL HIGH (ref 70–99)
Potassium: 4.2 mmol/L (ref 3.5–5.1)
Sodium: 143 mmol/L (ref 135–145)

## 2021-05-08 LAB — CBC
HCT: 30.8 % — ABNORMAL LOW (ref 36.0–46.0)
Hemoglobin: 9.7 g/dL — ABNORMAL LOW (ref 12.0–15.0)
MCH: 26.1 pg (ref 26.0–34.0)
MCHC: 31.5 g/dL (ref 30.0–36.0)
MCV: 83 fL (ref 80.0–100.0)
Platelets: 197 10*3/uL (ref 150–400)
RBC: 3.71 MIL/uL — ABNORMAL LOW (ref 3.87–5.11)
RDW: 14.1 % (ref 11.5–15.5)
WBC: 10 10*3/uL (ref 4.0–10.5)
nRBC: 0 % (ref 0.0–0.2)

## 2021-05-08 NOTE — Progress Notes (Signed)
PROGRESS NOTE  [name] IZT:[id number] DOB: [birth year] DOA: 05/04/2021 PCP: [name], MD   LOS: 3 days   Brief narrative: [name] is a 80 y.o. 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  Patient does have chronic abdominal pain which was unchanged.  In the ED WBC was 9.3.  Troponin was negative.  Creatinine was slightly elevated at 1.5.  Chest x-ray showed moderate left-sided effusion with left lower lobe consolidation or mass.  Patient had elevated blood pressure and pulse oximetry was 91%.  Patient underwent thoracocentesis by interventional radiology in the ED for left-sided pleural effusion and was admitted hospital for further evaluation and treatment.  Assessment/Plan:  Principal Problem:   Pleural effusion on left Active Problems:   Gout   HTN (hypertension)   Chronic kidney disease, stage 3b (HCC)   CHF (congestive heart failure), NYHA class III, chronic, diastolic (HCC)   Chronic respiratory failure with hypoxia (HCC)   Normocytic anemia   Pleural effusion  Acute on chronic respiratory failure with hypoxia with left pleural effusion  Patient underwent ultrasound-guided thoracocentesis on 05/04/2021 with removal of 850 mL of pleural fluid.  Status post removal with improvement in symptoms.  Chest x-ray showed evidence of consolidation. Patient has been initiated on IV antibiotic with Rocephin and Zithromax.  Continue for now course..  Pleural fluid but no organisms seen in gram stain.  Fluid culture negative in less than 24 hours.  Continue  inhalers.    Gout Continue allopurinol   Essential hypertension  Continue metoprolol, isosorbide and hydralazine.  Continue to monitor blood pressure.     Chronic kidney disease, stage 3b Continue to monitor creatinine levels.  Latest creatinine of 1.7.     CHF  NYHA class III, chronic, diastolic On metoprolol,, torsemide.  Continue strict intake and output charting, daily weights.  BNP at 34 .     Normocytic anemia On iron supplementation.  Latest hemoglobin of 9.7  Debility, deconditioning.  PT evaluation recommended skilled nursing facility placement.  Spoke with the patient about it.  She wishes to think about it.    DVT prophylaxis: enoxaparin (LOVENOX) injection 30 mg Start: 05/04/21 2200  Code Status: Full code  Family Communication:  I spoke with the patient's daughter on the phone and updated her about the clinical condition of the patient.   Status is: Inpatient  Remains inpatient appropriate because: IV treatments appropriate due to intensity of illness or inability to take PO, and Inpatient level of care appropriate due to severity of illness  Dispo: The patient is from: Home              Anticipated d/c is to: Skilled nursing facility as per PT recommendation.  TOC has been consulted.  Patient wishes to be reevaluated by physical therapy today for possible DC home with home health.              Patient currently is medically stable to d/c.   Difficult to place patient No  Consultants: Interventional radiology  Procedures: Ultrasound guided left-sided thoracocentesis on 05/04/2021  Anti-infectives:  Rocephin and Zithromax 10/7>  Anti-infectives (From admission, onward)    Start     Dose/Rate Route  Frequency Ordered Stop   05/05/21 1600  cefTRIAXone (ROCEPHIN) 1 g in sodium chloride 0.9 % 100 mL IVPB        1 g 200 mL/hr over 30 Minutes Intravenous Daily 05/05/21 1425     05/05/21 1600  azithromycin (ZITHROMAX) 500 mg in sodium chloride 0.9 % 250 mL IVPB         500 mg 250 mL/hr over 60 Minutes Intravenous Daily 05/05/21 1425        Subjective: Today, patient was seen and examined at bedside.  Denies increasing dyspnea, chest pain, nausea or vomiting.  Objective: Vitals:   05/08/21 0708 05/08/21 0949  BP:  (!) 172/44  Pulse:  78  Resp:    Temp:    SpO2: 98%     Intake/Output Summary (Last 24 hours) at 05/08/2021 1338 Last data filed at 05/08/2021 0600 Gross per 24 hour  Intake 240 ml  Output 1750 ml  Net -1510 ml    Filed Weights   05/05/21 1347  Weight: 81.3 kg   Body mass index is 35 kg/m.   Physical Exam:  General: Obese, not in obvious distress, chronically ill, on nasal cannula oxygen, HENT:   No scleral pallor or icterus noted. Oral mucosa is moist.  Chest:   Diminished breath sounds bilaterally.  CVS: S1 &S2 heard. No murmur.  Regular rate and rhythm. Abdomen: Soft, nontender, nondistended.  Bowel sounds are heard.  Colostomy and ventral hernia noted  Extremities: No cyanosis, clubbing, bilateral lower extremity pretibial venous stasis..  Peripheral pulses are palpable. Psych: Alert, awake and communicative and oriented. CNS:  No cranial nerve deficits.  Moves all extremities.  Generalized weakness noted. Skin: Warm and dry.  Venous stasis in the lower extremities.  Data Review: I have personally reviewed the following laboratory data and studies,  CBC: Recent Labs  Lab 05/04/21 1025 05/06/21 0355 05/08/21 0358  WBC 9.3 10.2 10.0  NEUTROABS 7.2  --   --   HGB 10.6* 9.4* 9.7*  HCT 34.2* 30.4* 30.8*  MCV 84.4 83.3 83.0  PLT 220 202 132    Basic Metabolic Panel: Recent Labs  Lab 05/04/21 1025 05/06/21 0355 05/08/21 0358  NA 142 134* 143  K 4.5 4.6 4.2  CL 105 97* 105  CO2 32 29 30  GLUCOSE 114* 109* 136*  BUN 31* 45* 51*  CREATININE 1.57* 1.50* 1.79*  CALCIUM 10.0 8.8* 9.6  MG  --  2.2  --     Liver Function Tests: Recent Labs  Lab 05/04/21 1025  AST 16  ALT 10  ALKPHOS 59   BILITOT 0.5  PROT 8.0  ALBUMIN 3.7    No results for input(s): LIPASE, AMYLASE in the last 168 hours. No results for input(s): AMMONIA in the last 168 hours. Cardiac Enzymes: No results for input(s): CKTOTAL, CKMB, CKMBINDEX, TROPONINI in the last 168 hours. BNP (last 3 results) Recent Labs    05/04/21 1023  BNP 34.9     ProBNP (last 3 results) No results for input(s): PROBNP in the last 8760 hours.  CBG: No results for input(s): GLUCAP in the last 168 hours. Recent Results (from the past 240 hour(s))  Resp Panel by RT-PCR (Flu A&B, Covid) Nasopharyngeal Swab     Status: None   Collection Time: 05/04/21 11:03 AM   Specimen: Nasopharyngeal Swab; Nasopharyngeal(NP) swabs in vial transport medium  Result Value Ref Range Status   SARS Coronavirus 2 by RT PCR NEGATIVE NEGATIVE Final    Comment: (NOTE) SARS-CoV-2  target nucleic acids are NOT DETECTED.  The SARS-CoV-2 RNA is generally detectable in upper respiratory specimens during the acute phase of infection. The lowest concentration of SARS-CoV-2 viral copies this assay can detect is 138 copies/mL. A negative result does not preclude SARS-Cov-2 infection and should not be used as the sole basis for treatment or other patient management decisions. A negative result may occur with  improper specimen collection/handling, submission of specimen other than nasopharyngeal swab, presence of viral mutation(s) within the areas targeted by this assay, and inadequate number of viral copies(<138 copies/mL). A negative result must be combined with clinical observations, patient history, and epidemiological information. The expected result is Negative.  Fact Sheet for Patients:  EntrepreneurPulse.com.au  Fact Sheet for Healthcare Providers:  IncredibleEmployment.be  This test is no t yet approved or cleared by the [place] FDA and  has been authorized for detection and/or diagnosis of  SARS-CoV-2 by FDA under an Emergency Use Authorization (EUA). This EUA will remain  in effect (meaning this test can be used) for the duration of the COVID-19 declaration under Section 564(b)(1) of the Act, 21 U.S.C.section 360bbb-3(b)(1), unless the authorization is terminated  or revoked sooner.       Influenza A by PCR NEGATIVE NEGATIVE Final   Influenza B by PCR NEGATIVE NEGATIVE Final    Comment: (NOTE) The Xpert Xpress SARS-CoV-2/FLU/RSV plus assay is intended as an aid in the diagnosis of influenza from Nasopharyngeal swab specimens and should not be used as a sole basis for treatment. Nasal washings and aspirates are unacceptable for Xpert Xpress SARS-CoV-2/FLU/RSV testing.  Fact Sheet for Patients: EntrepreneurPulse.com.au  Fact Sheet for Healthcare Providers: IncredibleEmployment.be  This test is not yet approved or cleared by the [place] FDA and has been authorized for detection and/or diagnosis of SARS-CoV-2 by FDA under an Emergency Use Authorization (EUA). This EUA will remain in effect (meaning this test can be used) for the duration of the COVID-19 declaration under Section 564(b)(1) of the Act, 21 U.S.C. section 360bbb-3(b)(1), unless the authorization is terminated or revoked.  Performed at [facility], [place] [address], [place], [place] [address]   Culture, body fluid w Gram Stain-bottle     Status: None (Preliminary result)   Collection Time: 05/04/21  3:15 PM   Specimen: Pleura  Result Value Ref Range Status   Specimen Description PLEURAL  Final   Special Requests BOTTLES DRAWN AEROBIC AND ANAEROBIC  Final   Culture   Final    NO GROWTH 4 DAYS Performed at [facility], [place] [address], [place],  [address]    Report Status PENDING  Incomplete  Gram stain     Status: None   Collection Time: 05/04/21  3:15 PM   Specimen: Pleura  Result Value Ref Range Status   Specimen  Description PLEURAL  Final   Special Requests NONE  Final   Gram Stain   Final    WBC PRESENT,BOTH PMN AND MONONUCLEAR NO ORGANISMS SEEN CYTOSPIN SMEAR Performed at [facility], [address], [place],  [address]    Report Status 05/05/2021 FINAL  Final      Studies: No results found.   Flora Lipps, MD  [facility] 05/08/2021  If 7PM-7AM, please contact night-coverage

## 2021-05-08 NOTE — Progress Notes (Signed)
0900- Pt lying in bed in nad. Pt is a & o x4. Breath sounds diminished bilaterally. 02 @ 2L/[place]. AP regular. Abdomen soft and tender at hernia site. Colostomy to LLQ with Hollister appliance in place. Hernia noted to the right of colostomy.Bowel sounds active x4 quadrants. 578m stool emptied from colostomy. Pt refuses to allow this nurse to discard ostomy bag and apply new one. Current ostomy bag cleaned with sterile h20. No further complaints voiced. Call bell within reach.  1200- IV infiltrated. Consult for IV team entered in computer.   1418- Rocephin 1gm IVPB started via #22g IV in RFA recently placed by IV team nurse.  1600- Zithromax IVPB started @ 2025mhr. IV site asymptomatic. Pt's granddaughter present at bedside.  1630- OOB to chair with PT.  1748- C/o pain and medicated with tylenol and dilaudid. Eating dinner at this time. Call bell within reach. Will continue to monitor.

## 2021-05-08 NOTE — Progress Notes (Signed)
Physical Therapy Treatment Patient Details Name: [name] MRN: [id number] DOB: 10-07-1940 Today's Date: 05/08/2021   History of Present Illness 80 y.o. female referred to ED by PCP after L chest radiograph at showed L pleural effusion and worsening of chronic respiratory failure post COVID-19. Admitted 10/6 for Pleural effusion on L. PMH significant for COPD, CHF, HTN, OSA.    PT Comments    Pt wishes to d/c home however reports needing assist to get OOB.  Pt encouraged to perform as much as she can without physical assist.  Pt still required mod assist for bed mobility and at least min assist for transfers today.  Pt does reports sleeping in a lift chair however states she needs to be able to walk in order to d/c home.  And pt felt unable to ambulate today.  Continue to recommend SNF upon d/c.    Recommendations for follow up therapy are one component of a multi-disciplinary discharge planning process, led by the attending physician.  Recommendations may be updated based on patient status, additional functional criteria and insurance authorization.  Follow Up Recommendations  SNF     Equipment Recommendations  None recommended by PT    Recommendations for Other Services       Precautions / Restrictions Precautions Precautions: Fall Precaution Comments: incontinent Restrictions Weight Bearing Restrictions: No     Mobility  Bed Mobility Overal bed mobility: Needs Assistance Bed Mobility: Supine to Sit     Supine to sit: Mod assist;HOB elevated     General bed mobility comments: assist for trunk and scooting to EOB, pt reliant on UE assist    Transfers Overall transfer level: Needs assistance Equipment used: Rolling walker (2 wheeled) Transfers: Sit to/from [employer] Transfers Sit to Stand: Min assist;From elevated surface Stand pivot transfers: Min assist       General transfer comment: pt reports she has a lift chair at home, still requiring at  least min assist from elevated position, pt with difficulty shifting weight to move feet for small steps to recliner  Ambulation/Gait             General Gait Details: pt declined ambulating in room due to fatigue, also with difficulty advancing/lifting LEs   Stairs             Wheelchair Mobility    Modified Rankin (Stroke Patients Only)       Balance Overall balance assessment: Needs assistance         Standing balance support: Bilateral upper extremity supported Standing balance-Leahy Scale: Poor Standing balance comment: needs bilateral UE support                            Cognition Arousal/Alertness: Awake/alert Behavior During Therapy: WFL for tasks assessed/performed Overall Cognitive Status: Within Functional Limits for tasks assessed                                        Exercises      General Comments        Pertinent Vitals/Pain Pain Assessment: Faces Faces Pain Scale: Hurts little more Pain Location: "plantar fasciitis" Pain Descriptors / Indicators: Discomfort Pain Intervention(s): Repositioned;Monitored during session (reports warm packs to feet helped last night)    Home Living  Prior Function            PT Goals (current goals can now be found in the care plan section) Acute Rehab PT Goals Patient Stated Goal: To get better, to go home Progress towards PT goals: Progressing toward goals    Frequency    Min 2X/week      PT Plan Current plan remains appropriate    Co-evaluation              AM-PAC PT "6 Clicks" Mobility   Outcome Measure  Help needed turning from your back to your side while in a flat bed without using bedrails?: A Lot Help needed moving from lying on your back to sitting on the side of a flat bed without using bedrails?: A Lot Help needed moving to and from a bed to a chair (including a wheelchair)?: A Lot Help needed standing up from  a chair using your arms (e.g., wheelchair or bedside chair)?: A Lot Help needed to walk in hospital room?: A Lot Help needed climbing 3-5 steps with a railing? : Total 6 Click Score: 11    End of Session Equipment Utilized During Treatment: Gait belt;Oxygen Activity Tolerance: Patient limited by fatigue Patient left: in chair;with call bell/phone within reach Nurse Communication: Mobility status PT Visit Diagnosis: Muscle weakness (generalized) (M62.81);Difficulty in walking, not elsewhere classified (R26.2)     Time: 6720-9470 PT Time Calculation (min) (ACUTE ONLY): 14 min  Charges:  $Therapeutic Activity: 8-22 mins                     Jannette Spanner PT, DPT Acute Rehabilitation Services Pager: [phone] Office: [place] 05/08/2021, 4:07 PM

## 2021-05-08 NOTE — Progress Notes (Signed)
Occupational Therapy Treatment Patient Details Name: [name] MRN: [id number] DOB: 1940/10/16 Today's Date: 05/08/2021   History of present illness 80 y.o. female referred to ED by PCP after L chest radiograph at showed L pleural effusion and worsening of chronic respiratory failure post COVID-19. Admitted 10/6 for Pleural effusion on L. PMH significant for COPD, CHF, HTN, OSA.   OT comments  Patient was educated on importance of managing pain to be able to participate in therapy. Patient verbalized understanding. Nurse consulted. Nurse to speak with MD about pain medications. Patient participated in sit to stands with standing tolerance about 45 seconds between each transition with mod A to increase independence in toileting tasks. Patient's discharge plan remains appropriate at this time. OT will continue to follow acutely.     Recommendations for follow up therapy are one component of a multi-disciplinary discharge planning process, led by the attending physician.  Recommendations may be updated based on patient status, additional functional criteria and insurance authorization.    Follow Up Recommendations  SNF    Equipment Recommendations       Recommendations for Other Services      Precautions / Restrictions Precautions Precautions: Fall Precaution Comments: incontinent Restrictions Weight Bearing Restrictions: No       Mobility Bed Mobility                    Transfers Overall transfer level: Needs assistance Equipment used: Rolling walker (2 wheeled) Transfers: Sit to/from Stand Sit to Stand: Mod assist              Balance Overall balance assessment: Needs assistance         Standing balance support: Bilateral upper extremity supported Standing balance-Leahy Scale: Poor Standing balance comment: needs bilateral UE support                           ADL either performed or assessed with clinical judgement   ADL Overall  ADL's : Needs assistance/impaired     Grooming: Set up;Sitting                                 General ADL Comments: patient participated in sit to stands x3 with about 45 seodnds of standing wiht BUE support to inrease independence in toileting transfers and reduce need for phyical assistance with ADLs. patient reportd being in 5-6/10 pain in abdomen. patient reported she did not want to take pain medication because it made her feel sick. nurse consulted. nurse in room and to speak with MD reguarding medications.     Vision Baseline Vision/History: 1 Wears glasses Ability to See in Adequate Light: 1 Impaired Patient Visual Report: No change from baseline     Perception     Praxis      Cognition Arousal/Alertness: Awake/alert Behavior During Therapy: WFL for tasks assessed/performed Overall Cognitive Status: Within Functional Limits for tasks assessed                                          Exercises     Shoulder Instructions       General Comments      Pertinent Vitals/ Pain       Pain Assessment: Faces Faces Pain Scale: Hurts even more Pain Location: back, abd, bil  LEs/feet Pain Descriptors / Indicators: Discomfort;Sore;Grimacing Pain Intervention(s): Limited activity within patient's tolerance;Monitored during session  Home Living                                          Prior Functioning/Environment              Frequency  Min 2X/week        Progress Toward Goals  OT Goals(current goals can now be found in the care plan section)  Progress towards OT goals: Progressing toward goals  Acute Rehab OT Goals Patient Stated Goal: To get better, to go home  Plan      Co-evaluation                 AM-PAC OT "6 Clicks" Daily Activity     Outcome Measure   Help from another person eating meals?: None Help from another person taking care of personal grooming?: A Little Help from another person  toileting, which includes using toliet, bedpan, or urinal?: A Lot Help from another person bathing (including washing, rinsing, drying)?: A Lot Help from another person to put on and taking off regular upper body clothing?: A Little Help from another person to put on and taking off regular lower body clothing?: A Lot 6 Click Score: 16    End of Session Equipment Utilized During Treatment: Oxygen;Rolling walker  OT Visit Diagnosis: Unsteadiness on feet (R26.81);Other abnormalities of gait and mobility (R26.89);Pain;Muscle weakness (generalized) (M62.81)   Activity Tolerance Patient tolerated treatment well   Patient Left in chair;with call bell/phone within reach   Nurse Communication Mobility status        Time: 7846-9629 OT Time Calculation (min): 14 min  Charges: OT General Charges $OT Visit: 1 Visit OT Treatments $Therapeutic Activity: 8-22 mins  Jackelyn Poling OTR/L, MS Acute Rehabilitation Department Office# [phone] Pager# [phone]   [place] 05/08/2021, 3:45 PM

## 2021-05-08 NOTE — Care Management Important Message (Signed)
Important Message  Patient Details IM Letter given to the Patient. Name: [name] MRN: [id number] Date of Birth: [birth year]   Medicare Important Message Given:  Yes     Kerin Salen 05/08/2021, 12:45 PM

## 2021-05-09 LAB — CULTURE, BODY FLUID W GRAM STAIN -BOTTLE: Culture: NO GROWTH

## 2021-05-09 MED ORDER — GUAIFENESIN-DM 100-10 MG/5ML PO SYRP: 5.0000 mL | ORAL_SOLUTION | ORAL | 0 refills | Status: DC | PRN

## 2021-05-09 NOTE — TOC Transition Note (Addendum)
Transition of Care [facility]) - CM/SW Discharge Note   Patient Details  Name: [name] MRN: [id number] Date of Birth: [birth year]  Transition of Care [facility]) CM/SW Contact:  Ross Ludwig, LCSW Phone Number: 05/09/2021, 1:28 PM   Clinical Narrative:     PT recommended SNF, however patient does not want to go to SNF.  Patient prefers to go home with home health.  CSW spoke to patient, and she will accept home health.  Patient did not have any preference.  Patient will be going home with home health PT, OT, and Soc Work through [place].  CSW signing off please reconsult with any other social work needs, home health agency has been notified of planned discharge.  Patient is aware and agreeable to having [facility] see her.  Per patient, her family will be transporting her back home.   Final next level of care: [place] Barriers to Discharge: Barriers Resolved   Patient Goals and CMS Choice Patient states their goals for this hospitalization and ongoing recovery are:: To return back home with home health services. CMS Medicare.gov Compare Post Acute Care list provided to:: Patient Choice offered to / list presented to : Patient  Discharge Placement   Existing PASRR number confirmed : 05/09/21                   Discharge Plan and Services                          HH Arranged: OT, PT, Social Work [employer] Agency: [place] Date [facility] Agency Contacted: 05/09/21 Time [place]: 7001 Representative spoke with at [place]: Cindie  Social Determinants of Health ([place]) Interventions     Readmission Risk Interventions No flowsheet data found.

## 2021-05-09 NOTE — TOC Progression Note (Signed)
Transition of Care [facility]) - Progression Note    Patient Details  Name: [name] MRN: [id number] Date of Birth: 1940/12/13  [facility]) CM/SW Contact  Ross Ludwig, [place] Phone Number: 05/09/2021, 11:19 AM  Clinical Narrative:     Patient has been faxed out awaiting for bed offers for SNF placement.       Expected Discharge Plan and Services                                                 Social Determinants of Health (SDOH) Interventions    Readmission Risk Interventions No flowsheet data found.

## 2021-05-09 NOTE — Progress Notes (Signed)
Nursing Discharge Note   Admit Date: 05/04/2021   Discharge date: 05/09/2021   Prudie Guthridge is to be discharged home per MD order.  AVS completed. Reviewed with patient and family at bedside. Highlighted copy provided for patient to take home.  Patient/caregiver able to verbalize understanding of discharge instructions. PIV removed. Patient stable upon discharge.   Discharge Instructions   None     Allergies as of 05/09/2021       Reactions   Celebrex [celecoxib]    edema   Amlodipine Besylate Other (See Comments)   REACTION: edema   Enalapril Cough   Lipitor [atorvastatin]    Muscle aches   Amoxicillin Diarrhea   DID THE REACTION INVOLVE: Swelling of the face/tongue/throat, SOB, or low BP? N Sudden or severe rash/hives, skin peeling, or the inside of the mouth or nose? N Did it require medical treatment? N When did it last happen?      MORE THAN 10 YEARS If all above answers are "NO", may proceed with cephalosporin use.   Codeine Sulfate Nausea Only   Hydrocodone-acetaminophen Nausea Only        Medication List     TAKE these medications    acetaminophen 325 MG tablet Commonly known as: TYLENOL Take 325-650 mg by mouth daily as needed for moderate pain, fever or headache.   albuterol 108 (90 Base) MCG/ACT inhaler Commonly known as: ProAir HFA Inhale 1-2 puffs into the lungs every 6 (six) hours as needed for wheezing or shortness of breath.   allopurinol 100 MG tablet Commonly known as: ZYLOPRIM TAKE 1 TABLET(100 MG) BY MOUTH DAILY What changed: See the new instructions.   Breztri Aerosphere 160-9-4.8 MCG/ACT Aero Generic drug: Budeson-Glycopyrrol-Formoterol Inhale 2 puffs into the lungs 2 (two) times daily.   ferrous sulfate 325 (65 FE) MG tablet Take 1 tablet (325 mg total) by mouth 2 (two) times daily with a meal.   guaiFENesin-dextromethorphan 100-10 MG/5ML syrup Commonly known as: ROBITUSSIN DM Take 5 mLs by mouth every 4 (four) hours as needed for  cough (chest congestion).   isosorbide-hydrALAZINE 20-37.5 MG tablet Commonly known as: BIDIL TAKE 1 TABLET BY MOUTH THREE TIMES DAILY What changed: when to take this   metoprolol tartrate 25 MG tablet Commonly known as: LOPRESSOR Take 1 tablet (25 mg total) by mouth 2 (two) times daily. What changed: when to take this   torsemide 20 MG tablet Commonly known as: DEMADEX Take 1 tablet (20 mg total) by mouth 2 (two) times daily. What changed:  how much to take when to take this   Trelegy Ellipta 100-62.5-25 MCG/INH Aepb Generic drug: Fluticasone-Umeclidin-Vilant Inhale 1 puff into the lungs daily.   Vasculera Tabs Take 1 capsule by mouth daily.        Discharge Instructions/ Education: Discharge instructions given to patient/family with verbalized understanding. Discharge education completed with patient/family including: follow up instructions, medication list, discharge activities, and limitations if indicated. Patient and family able to verbalize understanding, all questions fully answered. Patient transported to hospital lobby and discharged home via private automobile.

## 2021-05-09 NOTE — Discharge Summary (Signed)
Physician Discharge Summary  [name] GTX:[id number] DOB: [birth year] DOA: 05/04/2021  PCP: [name], MD  Admit date: 05/04/2021 Discharge date: 05/09/2021  Admitted From: Home  Discharge disposition: Home with home health  Recommendations for Outpatient Follow-Up:  Follow up with your primary care provider in one week.  Check CBC, BMP, magnesium in the next visit Follow-up with cardiology as scheduled by the clinic  Discharge Diagnosis:   Principal Problem:   Pleural effusion on left Active Problems:   Gout   HTN (hypertension)   Chronic kidney disease, stage 3b (HCC)   CHF (congestive heart failure), NYHA class III, chronic, diastolic (HCC)   Chronic respiratory failure with hypoxia (HCC)   Normocytic anemia   Pleural effusion   Discharge Condition: Improved.  Diet recommendation: Low sodium, heart healthy.  Fluid restriction 1500 mL/day.  Wound care: None.  Code status: Full.   History of Present Illness:   [name] is a 80 y.o. 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  Patient does have chronic abdominal pain which was unchanged.  In the ED WBC was 9.3.  Troponin was negative.  Creatinine was slightly elevated at 1.5.  Chest x-ray showed moderate left-sided effusion with left lower lobe consolidation or mass.  Patient had elevated blood pressure and pulse oximetry was 91%.  Patient underwent thoracocentesis by interventional radiology in the ED for left-sided pleural effusion and was admitted hospital for further evaluation  and treatment.  Hospital Course:   Following conditions were addressed during hospitalization as listed below,  Acute on chronic respiratory failure with hypoxia with left pleural effusion  Patient underwent ultrasound-guided thoracocentesis on 05/04/2021 with removal of 850 mL of pleural fluid.  Status post removal with improvement in symptoms.  Chest x-ray showed evidence of consolidation. Patient was initiated on IV antibiotic with Rocephin and Zithromax has completed 5-day course at this time. Pleural fluid but no organisms seen in gram stain.  Fluid culture negative in less than 24 hours.  Continue inhalers.     Gout Continue allopurinol on discharge.   Essential hypertension  Continue metoprolol, isosorbide and hydralazine from home.    Chronic kidney disease, stage 3b Been stable during hospitalization.  Will need outpatient monitoring.     CHF  NYHA class III, chronic, diastolic On metoprolol,, torsemide at home.  We will continue on discharge.    Normocytic anemia On iron supplementation.  Latest hemoglobin of 9.7   Debility, deconditioning.  PT evaluation recommended skilled nursing facility but patient has refused  skilled nursing facility placement.  We will arrange for home health on discharge.    Disposition.  At this time, patient is stable for disposition home with home health services.  She will be arranged for home PT, OT RN and social worker on discharge.  I had a prolonged discussion with the patient's granddaughter about the plan for discharge  but at at this time plan is to go home with home health.  Medical Consultants:   Interventional radiology  Procedures:    Ultrasound guided left-sided thoracocentesis on 05/04/2021 Subjective:   Today, patient was seen and examined bedside.  Denies increasing shortness of breath cough or fever.  She  is stating that she would like to go home and has refused skilled nursing facility placement.  Discharge Exam:    Vitals:   05/09/21 0527 05/09/21 0847  BP: (!) 168/49 (!) 179/48  Pulse:  70  Resp:  20  Temp:  98.2 F (36.8 C)  SpO2:  98%   Vitals:   05/08/21 2109 05/09/21 0500 05/09/21 0527 05/09/21 0847  BP: (!) 152/45 (!) 183/47 (!) 168/49 (!) 179/48  Pulse: 67 71  70  Resp: (!) 22 20  20   Temp: 98 F (36.7 C) 98 F (36.7 C)  98.2 F (36.8 C)  TempSrc: Oral Oral  Oral  SpO2: 96% 96%  98%  Weight:      Height:       General: Alert awake, not in obvious distress, obese, chronically ill, on nasal cannula oxygen HENT: pupils equally reacting to light,  No scleral pallor or icterus noted. Oral mucosa is moist.  Chest:  .  Diminished breath sounds bilaterally.  CVS: S1 &S2 heard. No murmur.  Regular rate and rhythm. Abdomen: Soft, nontender, nondistended.  Bowel sounds are heard.   Extremities: No cyanosis, clubbing with venous stasis of the lower extremity, peripheral pulses are palpable. Psych: Alert, awake and oriented, normal mood CNS:  No cranial nerve deficits.  Moves all extremities, generalized weakness noted. Skin: Warm and dry.  Venous stasis in the lower extremities.  The results of significant diagnostics from this hospitalization (including imaging, microbiology, ancillary and laboratory) are listed below for reference.     Diagnostic Studies:   DG Chest 1 View  Result Date: 05/04/2021 CLINICAL DATA:  Post thoracentesis EXAM: CHEST  1 VIEW COMPARISON:  05/03/2021 FINDINGS: Decreased left pleural effusion with small moderate left residual. There is also probably a small right pleural effusion. Cardiomegaly with aortic atherosclerosis. Possible small left apical pneumothorax. IMPRESSION: 1. Decreased left pleural effusion post thoracentesis with small moderate residual pleural effusion. Possible small left apical pneumothorax. 2. Cardiomegaly.  Probable small right pleural effusion. Critical Value/emergent results were called by telephone at the time of interpretation on  05/04/2021 at 4:27 pm to provider Dr. Olevia Bowens, Who verbally acknowledged these results. Electronically Signed   By: Donavan Foil M.D.   On: 05/04/2021 16:27   DG CHEST PORT 1 VIEW  Result Date: 05/05/2021 CLINICAL DATA:  80 year old female with history of pneumothorax. EXAM: PORTABLE CHEST 1 VIEW COMPARISON:  Chest x-ray 05/04/2021. FINDINGS: Lung volumes are low. Patchy multifocal ill-defined opacities throughout the left mid to lower lung, and at the right lung base. Complete obscuration of the left hemidiaphragm. Small right and moderate left pleural effusions. No pneumothorax. Cephalization of the pulmonary vasculature, without frank pulmonary edema. Heart size appears enlarged. Upper mediastinal contours are distorted by patient positioning. Atherosclerotic calcifications in the thoracic aorta. IMPRESSION: 1. Findings are concerning for multilobar bilateral pneumonia (left greater than right). 2. Small right and moderate left pleural effusions. 3. Cardiomegaly. 4. Aortic atherosclerosis. Electronically Signed   By: Vinnie Langton M.D.   On: 05/05/2021 06:15   US THORACENTESIS ASP PLEURAL SPACE W/IMG GUIDE  Result Date: 05/04/2021 INDICATION: Patient presented to the ED with shortness of breath and found to have a left pleural effusion. Interventional radiology asked to perform a diagnostic and therapeutic thoracentesis. EXAM: ULTRASOUND GUIDED THORACENTESIS MEDICATIONS: 1% lidocaine 10 mL COMPLICATIONS: SIR Level A - No therapy, no consequence. PROCEDURE: An ultrasound guided thoracentesis was thoroughly discussed with the patient and questions answered. The benefits, risks, alternatives and complications were also  discussed. The patient understands and wishes to proceed with the procedure. Written consent was obtained. Ultrasound was performed to localize and mark an adequate pocket of fluid in the left chest. The area was then prepped and draped in the normal sterile fashion. 1% Lidocaine was used for  local anesthesia. Under ultrasound guidance a 6 Fr Safe-T-Centesis catheter was introduced. Thoracentesis was performed. The catheter was removed and a dressing applied. FINDINGS: A total of approximately 850 mL of clear yellow fluid was removed. Samples were sent to the laboratory as requested by the clinical team. IMPRESSION: Successful ultrasound guided left thoracentesis yielding 850 mL of pleural fluid. Possible small pneumothorax identified on post-procedure chest x-ray. No intervention needed at this time. Read by: Soyla Dryer, NP Electronically Signed   By: Jacqulynn Cadet M.D.   On: 05/04/2021 16:41     Labs:   Basic Metabolic Panel: Recent Labs  Lab 05/04/21 1025 05/06/21 0355 05/08/21 0358  NA 142 134* 143  K 4.5 4.6 4.2  CL 105 97* 105  CO2 32 29 30  GLUCOSE 114* 109* 136*  BUN 31* 45* 51*  CREATININE 1.57* 1.50* 1.79*  CALCIUM 10.0 8.8* 9.6  MG  --  2.2  --    GFR Estimated Creatinine Clearance: 23.7 mL/min (A) (by C-G formula based on SCr of 1.79 mg/dL (H)). Liver Function Tests: Recent Labs  Lab 05/04/21 1025  AST 16  ALT 10  ALKPHOS 59  BILITOT 0.5  PROT 8.0  ALBUMIN 3.7   No results for input(s): LIPASE, AMYLASE in the last 168 hours. No results for input(s): AMMONIA in the last 168 hours. Coagulation profile Recent Labs  Lab 05/04/21 1025  INR 1.1    CBC: Recent Labs  Lab 05/04/21 1025 05/06/21 0355 05/08/21 0358  WBC 9.3 10.2 10.0  NEUTROABS 7.2  --   --   HGB 10.6* 9.4* 9.7*  HCT 34.2* 30.4* 30.8*  MCV 84.4 83.3 83.0  PLT 220 202 197   Cardiac Enzymes: No results for input(s): CKTOTAL, CKMB, CKMBINDEX, TROPONINI in the last 168 hours. BNP: Invalid input(s): POCBNP CBG: No results for input(s): GLUCAP in the last 168 hours. D-Dimer No results for input(s): DDIMER in the last 72 hours. Hgb A1c No results for input(s): HGBA1C in the last 72 hours. Lipid Profile No results for input(s): CHOL, HDL, LDLCALC, TRIG, CHOLHDL,  LDLDIRECT in the last 72 hours. Thyroid function studies No results for input(s): TSH, T4TOTAL, T3FREE, THYROIDAB in the last 72 hours.  Invalid input(s): FREET3 Anemia work up No results for input(s): VITAMINB12, FOLATE, FERRITIN, TIBC, IRON, RETICCTPCT in the last 72 hours. Microbiology Recent Results (from the past 240 hour(s))  Resp Panel by RT-PCR (Flu A&B, Covid) Nasopharyngeal Swab     Status: None   Collection Time: 05/04/21 11:03 AM   Specimen: Nasopharyngeal Swab; Nasopharyngeal(NP) swabs in vial transport medium  Result Value Ref Range Status   SARS Coronavirus 2 by RT PCR NEGATIVE NEGATIVE Final    Comment: (NOTE) SARS-CoV-2 target nucleic acids are NOT DETECTED.  The SARS-CoV-2 RNA is generally detectable in upper respiratory specimens during the acute phase of infection. The lowest concentration of SARS-CoV-2 viral copies this assay can detect is 138 copies/mL. A negative result does not preclude SARS-Cov-2 infection and should not be used as the sole basis for treatment or other patient management decisions. A negative result may occur with  improper specimen collection/handling, submission of specimen other than nasopharyngeal swab, presence of viral mutation(s) within the areas  targeted by this assay, and inadequate number of viral copies(<138 copies/mL). A negative result must be combined with clinical observations, patient history, and epidemiological information. The expected result is Negative.  Fact Sheet for Patients:  EntrepreneurPulse.com.au  Fact Sheet for Healthcare Providers:  IncredibleEmployment.be  This test is no t yet approved or cleared by the [place] FDA and  has been authorized for detection and/or diagnosis of SARS-CoV-2 by FDA under an Emergency Use Authorization (EUA). This EUA will remain  in effect (meaning this test can be used) for the duration of the COVID-19 declaration under Section  564(b)(1) of the Act, 21 U.S.C.section 360bbb-3(b)(1), unless the authorization is terminated  or revoked sooner.       Influenza A by PCR NEGATIVE NEGATIVE Final   Influenza B by PCR NEGATIVE NEGATIVE Final    Comment: (NOTE) The Xpert Xpress SARS-CoV-2/FLU/RSV plus assay is intended as an aid in the diagnosis of influenza from Nasopharyngeal swab specimens and should not be used as a sole basis for treatment. Nasal washings and aspirates are unacceptable for Xpert Xpress SARS-CoV-2/FLU/RSV testing.  Fact Sheet for Patients: EntrepreneurPulse.com.au  Fact Sheet for Healthcare Providers: IncredibleEmployment.be  This test is not yet approved or cleared by the [place] FDA and has been authorized for detection and/or diagnosis of SARS-CoV-2 by FDA under an Emergency Use Authorization (EUA). This EUA will remain in effect (meaning this test can be used) for the duration of the COVID-19 declaration under Section 564(b)(1) of the Act, 21 U.S.C. section 360bbb-3(b)(1), unless the authorization is terminated or revoked.  Performed at [facility], [place] [address], [place], [place] [address]   Culture, body fluid w Gram Stain-bottle     Status: None   Collection Time: 05/04/21  3:15 PM   Specimen: Pleura  Result Value Ref Range Status   Specimen Description PLEURAL  Final   Special Requests BOTTLES DRAWN AEROBIC AND ANAEROBIC  Final   Culture   Final    NO GROWTH 5 DAYS Performed at [facility], [place] [address] [place], [place] [address]    Report Status 05/09/2021 FINAL  Final  Gram stain     Status: None   Collection Time: 05/04/21  3:15 PM   Specimen: Pleura  Result Value Ref Range Status   Specimen Description PLEURAL  Final   Special Requests NONE  Final   Gram Stain   Final    WBC PRESENT,BOTH PMN AND MONONUCLEAR NO ORGANISMS SEEN CYTOSPIN SMEAR Performed at [facility], [address]  [place], [place] [address]    Report Status 05/05/2021 FINAL  Final     Discharge Instructions:   Discharge Instructions     Diet - low sodium heart healthy   Complete by: As directed    Discharge instructions   Complete by: As directed    Follow-up with your primary care physician in 1 week.  Check blood work at that time.  Do not overexert.  Continue oxygen at home.  Seek medical attention for worsening symptoms.  Continue to take your medications from home.  Okay to take Tylenol for pain.  Please apply warm compression over the areas of pain.   Increase activity slowly   Complete by: As directed       Allergies as of 05/09/2021       Reactions   Celebrex [celecoxib]    edema   Amlodipine Besylate Other (See Comments)   REACTION: edema   Enalapril Cough  Lipitor [atorvastatin]    Muscle aches   Amoxicillin Diarrhea   DID THE REACTION INVOLVE: Swelling of the face/tongue/throat, SOB, or low BP? N Sudden or severe rash/hives, skin peeling, or the inside of the mouth or nose? N Did it require medical treatment? N When did it last happen?      MORE THAN 10 YEARS If all above answers are "NO", may proceed with cephalosporin use.   Codeine Sulfate Nausea Only   Hydrocodone-acetaminophen Nausea Only        Medication List     TAKE these medications    acetaminophen 325 MG tablet Commonly known as: TYLENOL Take 325-650 mg by mouth daily as needed for moderate pain, fever or headache.   albuterol 108 (90 Base) MCG/ACT inhaler Commonly known as: ProAir HFA Inhale 1-2 puffs into the lungs every 6 (six) hours as needed for wheezing or shortness of breath.   allopurinol 100 MG tablet Commonly known as: ZYLOPRIM TAKE 1 TABLET(100 MG) BY MOUTH DAILY What changed: See the new instructions.   Breztri Aerosphere 160-9-4.8 MCG/ACT Aero Generic drug: Budeson-Glycopyrrol-Formoterol Inhale 2 puffs into the lungs 2 (two) times daily.   ferrous sulfate 325 (65 FE) MG tablet Take  1 tablet (325 mg total) by mouth 2 (two) times daily with a meal.   guaiFENesin-dextromethorphan 100-10 MG/5ML syrup Commonly known as: ROBITUSSIN DM Take 5 mLs by mouth every 4 (four) hours as needed for cough (chest congestion).   isosorbide-hydrALAZINE 20-37.5 MG tablet Commonly known as: BIDIL TAKE 1 TABLET BY MOUTH THREE TIMES DAILY What changed: when to take this   metoprolol tartrate 25 MG tablet Commonly known as: LOPRESSOR Take 1 tablet (25 mg total) by mouth 2 (two) times daily. What changed: when to take this   torsemide 20 MG tablet Commonly known as: DEMADEX Take 1 tablet (20 mg total) by mouth 2 (two) times daily. What changed:  how much to take when to take this   Trelegy Ellipta 100-62.5-25 MCG/INH Aepb Generic drug: Fluticasone-Umeclidin-Vilant Inhale 1 puff into the lungs daily.   Vasculera Tabs Take 1 capsule by mouth daily.          Time coordinating discharge: 39 minutes  Signed:  Saher Davee  [facility] 05/09/2021, 12:57 PM

## 2021-05-09 NOTE — [place] FL2 (Signed)
[place] MEDICAID FL2 LEVEL OF CARE SCREENING TOOL     IDENTIFICATION  Patient Name: [name] Birthdate: 03-21-1941 Sex: female Admission Date (Current Location): 05/04/2021  [facility] and [place] Number:  Herbalist and Address:  [facility],  [place], [place]      Provider Number: [id number]  Attending Physician Name and Address:  Flora Lipps, MD  Relative Name and Phone Number:  Linton Ham Daughter   [phone]  [place], [place] [phone]  [phone]  [place], Mcandrew   [phone]    Current Level of Care: Hospital Recommended Level of Care: [place] Prior Approval Number:    Date Approved/Denied:   PASRR Number: [id number]  Discharge Plan: SNF    Current Diagnoses: Patient Active Problem List   Diagnosis Date Noted   Pleural effusion 05/05/2021   Pleural effusion on left 05/04/2021   Normocytic anemia 05/04/2021   Subacute cough 05/03/2021   Pleural effusion, left 05/03/2021   Dysuria 09/28/2020   Irreducible ventral hernia 08/09/2020   COPD (chronic obstructive pulmonary disease) ([place]) 08/09/2020   Chronic respiratory failure with hypoxia ([place]) 02/06/2020   Routine general medical examination at a health care facility 02/06/2020   AVM (arteriovenous malformation) of small bowel, acquired with hemorrhage 08/14/2018   Multiple lung nodules on CT 05/05/2018   Stenosis of cervical spine with myelopathy ([place]) 01/01/2018   Atrial fibrillation ([place]) 10/17/2017   CHF (congestive heart failure), NYHA class III, chronic, diastolic ([place]) 50/53/9767   Angiodysplasia of cecum    Angiodysplasia of duodenum    Grade III diastolic dysfunction 34/19/3790   MGUS (monoclonal gammopathy of unknown significance) 11/08/2016   Asteatotic eczema 02/25/2016   COLD (chronic obstructive lung disease) (HCC)    Anemia, iron deficiency 11/16/2014   Cor pulmonale ([place]) 11/16/2014   OSA (obstructive sleep  apnea) 06/22/2013   Chronic venous stasis dermatitis of both lower extremities 06/02/2013   Vitamin D deficiency 02/06/2013   Osteopenia, senile 02/05/2013   Other screening mammogram 02/05/2013   Chronic kidney disease, stage 3b ([place]) 06/20/2010   Morbid obesity ([place]) 04/19/2010   Gout 01/10/2009   ULCERATIVE COLITIS-LEFT SIDE 03/19/2008   Vitamin B12 deficiency anemia 11/13/2006   Hyperlipidemia with target LDL less than 100 11/27/1996   GERD 07/30/1992   HTN (hypertension) 07/30/1988    Orientation RESPIRATION BLADDER Height & Weight     Time, Situation, Place, Self  O2 (2L) Incontinent Weight: 179 lb 3.2 oz (81.3 kg) Height:  5' (152.4 cm)  BEHAVIORAL SYMPTOMS/MOOD NEUROLOGICAL BOWEL NUTRITION STATUS      Continent Diet (Cardiac diet)  AMBULATORY STATUS COMMUNICATION OF NEEDS Skin   Limited Assist Verbally Normal                       Personal Care Assistance Level of Assistance  Bathing, Dressing, Feeding Bathing Assistance: Limited assistance Feeding assistance: Independent Dressing Assistance: Limited assistance     Functional Limitations Info  Sight, Speech, Hearing Sight Info: Adequate Hearing Info: Adequate Speech Info: Adequate    SPECIAL CARE FACTORS FREQUENCY  PT (By licensed PT), OT (By licensed OT)     PT Frequency: Minimum 5x a week OT Frequency: Minimum 5x a week            Contractures Contractures Info: Not present    Additional Factors Info  Code Status, Allergies Code Status Info: Full Code Allergies Info: Celebrex (Celecoxib)   Amlodipine Besylate   Enalapril   Lipitor (  Atorvastatin)   Amoxicillin   Codeine Sulfate   Hydrocodone-acetaminophen           Current Medications (05/09/2021):  This is the current hospital active medication list Current Facility-Administered Medications  Medication Dose Route Frequency Provider Last Rate Last Admin   acetaminophen (TYLENOL) tablet 650 mg  650 mg Oral Q6H PRN Reubin Milan, MD    650 mg at 05/08/21 1748   Or   acetaminophen (TYLENOL) suppository 650 mg  650 mg Rectal Q6H PRN Reubin Milan, MD       albuterol (PROVENTIL) (2.5 MG/3ML) 0.083% nebulizer solution 3 mL  3 mL Inhalation Q6H PRN Pokhrel, Laxman, MD       allopurinol (ZYLOPRIM) tablet 100 mg  100 mg Oral Daily Pokhrel, Laxman, MD   100 mg at 05/09/21 0939   azithromycin (ZITHROMAX) 500 mg in sodium chloride 0.9 % 250 mL IVPB  500 mg Intravenous Daily Pokhrel, Laxman, MD 250 mL/hr at 05/09/21 1045 500 mg at 05/09/21 1045   cefTRIAXone (ROCEPHIN) 1 g in sodium chloride 0.9 % 100 mL IVPB  1 g Intravenous Daily Pokhrel, Laxman, MD 200 mL/hr at 05/09/21 0945 1 g at 05/09/21 0945   diphenhydrAMINE (BENADRYL) capsule 25 mg  25 mg Oral Q6H PRN Pokhrel, Laxman, MD   25 mg at 05/06/21 0324   enoxaparin (LOVENOX) injection 30 mg  30 mg Subcutaneous Q24H Reubin Milan, MD   30 mg at 05/08/21 2116   ferrous sulfate tablet 325 mg  325 mg Oral BID WC Pokhrel, Laxman, MD   325 mg at 05/09/21 0849   umeclidinium bromide (INCRUSE ELLIPTA) 62.5 MCG/INH 1 puff  1 puff Inhalation Daily Pokhrel, Laxman, MD   1 puff at 05/09/21 0840   And   fluticasone furoate-vilanterol (BREO ELLIPTA) 100-25 MCG/INH 1 puff  1 puff Inhalation Daily Pokhrel, Laxman, MD   1 puff at 05/09/21 0840   guaiFENesin-dextromethorphan (ROBITUSSIN DM) 100-10 MG/5ML syrup 5 mL  5 mL Oral Q4H PRN Pokhrel, Laxman, MD   5 mL at 05/08/21 1241   HYDROmorphone (DILAUDID) injection 0.5 mg  0.5 mg Intravenous Q2H PRN Reubin Milan, MD   0.5 mg at 05/08/21 1747   isosorbide-hydrALAZINE (BIDIL) 20-37.5 MG per tablet 1 tablet  1 tablet Oral TID Pokhrel, Laxman, MD   1 tablet at 05/09/21 0940   metoprolol tartrate (LOPRESSOR) tablet 25 mg  25 mg Oral BID Pokhrel, Laxman, MD   25 mg at 05/09/21 0939   ondansetron (ZOFRAN) tablet 4 mg  4 mg Oral Q6H PRN Reubin Milan, MD       Or   ondansetron [facility]) injection 4 mg  4 mg Intravenous Q6H PRN Reubin Milan, MD   4 mg at 05/04/21 2209   torsemide (DEMADEX) tablet 20 mg  20 mg Oral BID Pokhrel, Laxman, MD   20 mg at 05/09/21 0848     Discharge Medications: Please see discharge summary for a list of discharge medications.  Relevant Imaging Results:  Relevant Lab Results:   Additional Information SSN [id number]  Ross Ludwig, LCSW

## 2021-05-10 ENCOUNTER — Telehealth: Payer: Self-pay

## 2021-05-10 LAB — CYTOLOGY - NON PAP

## 2021-05-10 NOTE — Telephone Encounter (Signed)
Transition Care Management Follow-up Telephone Call Date of discharge and from where: 05/09/2021 FROM [facility] How have you been since you were released from the hospital? "Still coughing, and having trouble walking" Any questions or concerns? No  Items Reviewed: Did the pt receive and understand the discharge instructions provided? Yes  Medications obtained and verified? Yes  Other? No  Any new allergies since your discharge? No  Dietary orders reviewed? Yes; Low Sodium Diet Do you have support at home? Yes ; "Granddaughter has been helping and preparing food."  [place] and Equipment/Supplies: Were home health services ordered? yes If so, what is the name of the agency? [facility]  Has the agency set up a time to come to the patient's home? Yes; scheduled for 05/12/2021 Were any new equipment or medical supplies ordered?  No What is the name of the medical supply agency? none Were you able to get the supplies/equipment? not applicable Do you have any questions related to the use of the equipment or supplies? No  Functional Questionnaire: (I = Independent and D = Dependent) ADLs: D  Bathing/Dressing- D  Meal Prep- D  Eating- I  Maintaining continence- I (has a bedside commode)  Transferring/Ambulation- I (has walker)  Managing Meds- D  Follow up appointments reviewed:  PCP Hospital f/u appt confirmed? Yes  Scheduled to see Cathlean Cower, MD. on 05/16/2021 @ 1:40 pm. [facility] f/u appt confirmed? No   Are transportation arrangements needed? No  If their condition worsens, is the pt aware to call PCP or go to the Emergency Dept.? Yes Was the patient provided with contact information for the PCP's office or ED? Yes Was to pt encouraged to call back with questions or concerns? Yes

## 2021-05-10 NOTE — Progress Notes (Signed)
Abnormal cytology report was notified to me today..  Pleural fluid cytology showed metastatic adenocarcinoma.  Communicated with the patient's granddaughter on the phone listed in the computer.  I told her to touch base with her primary care physician to assist in further work-up process.

## 2021-05-12 ENCOUNTER — Telehealth: Payer: Self-pay | Admitting: Internal Medicine

## 2021-05-12 DIAGNOSIS — D631 Anemia in chronic kidney disease: Secondary | ICD-10-CM | POA: Diagnosis not present

## 2021-05-12 DIAGNOSIS — D630 Anemia in neoplastic disease: Secondary | ICD-10-CM | POA: Diagnosis not present

## 2021-05-12 DIAGNOSIS — U099 Post covid-19 condition, unspecified: Secondary | ICD-10-CM | POA: Diagnosis not present

## 2021-05-12 DIAGNOSIS — M19071 Primary osteoarthritis, right ankle and foot: Secondary | ICD-10-CM | POA: Diagnosis not present

## 2021-05-12 DIAGNOSIS — D519 Vitamin B12 deficiency anemia, unspecified: Secondary | ICD-10-CM | POA: Diagnosis not present

## 2021-05-12 DIAGNOSIS — M85832 Other specified disorders of bone density and structure, left forearm: Secondary | ICD-10-CM | POA: Diagnosis not present

## 2021-05-12 DIAGNOSIS — K219 Gastro-esophageal reflux disease without esophagitis: Secondary | ICD-10-CM | POA: Diagnosis not present

## 2021-05-12 DIAGNOSIS — I2781 Cor pulmonale (chronic): Secondary | ICD-10-CM | POA: Diagnosis not present

## 2021-05-12 DIAGNOSIS — I130 Hypertensive heart and chronic kidney disease with heart failure and stage 1 through stage 4 chronic kidney disease, or unspecified chronic kidney disease: Secondary | ICD-10-CM | POA: Diagnosis not present

## 2021-05-12 DIAGNOSIS — D472 Monoclonal gammopathy: Secondary | ICD-10-CM | POA: Diagnosis not present

## 2021-05-12 DIAGNOSIS — H539 Unspecified visual disturbance: Secondary | ICD-10-CM | POA: Diagnosis not present

## 2021-05-12 DIAGNOSIS — I700 Atherosclerosis of aorta: Secondary | ICD-10-CM | POA: Diagnosis not present

## 2021-05-12 DIAGNOSIS — M1030 Gout due to renal impairment, unspecified site: Secondary | ICD-10-CM | POA: Diagnosis not present

## 2021-05-12 DIAGNOSIS — N1832 Chronic kidney disease, stage 3b: Secondary | ICD-10-CM | POA: Diagnosis not present

## 2021-05-12 DIAGNOSIS — J918 Pleural effusion in other conditions classified elsewhere: Secondary | ICD-10-CM | POA: Diagnosis not present

## 2021-05-12 DIAGNOSIS — M4802 Spinal stenosis, cervical region: Secondary | ICD-10-CM | POA: Diagnosis not present

## 2021-05-12 DIAGNOSIS — I5042 Chronic combined systolic (congestive) and diastolic (congestive) heart failure: Secondary | ICD-10-CM | POA: Diagnosis not present

## 2021-05-12 DIAGNOSIS — G4733 Obstructive sleep apnea (adult) (pediatric): Secondary | ICD-10-CM | POA: Diagnosis not present

## 2021-05-12 DIAGNOSIS — M19072 Primary osteoarthritis, left ankle and foot: Secondary | ICD-10-CM | POA: Diagnosis not present

## 2021-05-12 DIAGNOSIS — M810 Age-related osteoporosis without current pathological fracture: Secondary | ICD-10-CM | POA: Diagnosis not present

## 2021-05-12 DIAGNOSIS — J9621 Acute and chronic respiratory failure with hypoxia: Secondary | ICD-10-CM | POA: Diagnosis not present

## 2021-05-12 DIAGNOSIS — M479 Spondylosis, unspecified: Secondary | ICD-10-CM | POA: Diagnosis not present

## 2021-05-12 DIAGNOSIS — J449 Chronic obstructive pulmonary disease, unspecified: Secondary | ICD-10-CM | POA: Diagnosis not present

## 2021-05-12 DIAGNOSIS — I878 Other specified disorders of veins: Secondary | ICD-10-CM | POA: Diagnosis not present

## 2021-05-12 DIAGNOSIS — E785 Hyperlipidemia, unspecified: Secondary | ICD-10-CM | POA: Diagnosis not present

## 2021-05-12 NOTE — Telephone Encounter (Signed)
Home Health verbal orders-caller/Agency: [facility] number: [phone]  Requesting OT/PT/Skilled nursing/Social Work/Speech: PT  Frequency: 1 week 1 starting 05-12-2021  4 week 2  Shawn states patient has medication interaction between Budeson-Glycopyrrol-Formoterol (BREZTRI AEROSPHERE) 160-9-4.8 MCG/ACT AERO and Promephazine

## 2021-05-12 NOTE — Telephone Encounter (Signed)
Verbal orders given  

## 2021-05-15 ENCOUNTER — Telehealth: Payer: Self-pay | Admitting: Internal Medicine

## 2021-05-15 NOTE — Telephone Encounter (Signed)
Hilliard Clark from [facility] has called and states that when he went out on Friday to begin care with pt. She stated that she has been depressed everyday, for 14 days prior to home health visit. He states she typically cares for her husband and daughter, and thinks it may be partly because of that. Wanted to follow- up with provider to see if she possibly needs medication.     Callback #- [phone]

## 2021-05-16 ENCOUNTER — Other Ambulatory Visit: Payer: Self-pay

## 2021-05-16 ENCOUNTER — Encounter: Payer: Self-pay | Admitting: Internal Medicine

## 2021-05-16 ENCOUNTER — Ambulatory Visit (INDEPENDENT_AMBULATORY_CARE_PROVIDER_SITE_OTHER): Payer: Medicare Other | Admitting: Internal Medicine

## 2021-05-16 ENCOUNTER — Other Ambulatory Visit: Payer: Self-pay | Admitting: Internal Medicine

## 2021-05-16 VITALS — BP 148/64 | HR 67 | Ht 60.0 in | Wt 181.0 lb

## 2021-05-16 DIAGNOSIS — H539 Unspecified visual disturbance: Secondary | ICD-10-CM | POA: Diagnosis not present

## 2021-05-16 DIAGNOSIS — C799 Secondary malignant neoplasm of unspecified site: Secondary | ICD-10-CM

## 2021-05-16 DIAGNOSIS — M810 Age-related osteoporosis without current pathological fracture: Secondary | ICD-10-CM | POA: Diagnosis not present

## 2021-05-16 DIAGNOSIS — J9621 Acute and chronic respiratory failure with hypoxia: Secondary | ICD-10-CM | POA: Diagnosis not present

## 2021-05-16 DIAGNOSIS — G4733 Obstructive sleep apnea (adult) (pediatric): Secondary | ICD-10-CM | POA: Diagnosis not present

## 2021-05-16 DIAGNOSIS — I10 Essential (primary) hypertension: Secondary | ICD-10-CM

## 2021-05-16 DIAGNOSIS — N1832 Chronic kidney disease, stage 3b: Secondary | ICD-10-CM | POA: Diagnosis not present

## 2021-05-16 DIAGNOSIS — M479 Spondylosis, unspecified: Secondary | ICD-10-CM | POA: Diagnosis not present

## 2021-05-16 DIAGNOSIS — I700 Atherosclerosis of aorta: Secondary | ICD-10-CM | POA: Diagnosis not present

## 2021-05-16 DIAGNOSIS — I878 Other specified disorders of veins: Secondary | ICD-10-CM | POA: Diagnosis not present

## 2021-05-16 DIAGNOSIS — K219 Gastro-esophageal reflux disease without esophagitis: Secondary | ICD-10-CM | POA: Diagnosis not present

## 2021-05-16 DIAGNOSIS — I480 Paroxysmal atrial fibrillation: Secondary | ICD-10-CM

## 2021-05-16 DIAGNOSIS — D631 Anemia in chronic kidney disease: Secondary | ICD-10-CM | POA: Diagnosis not present

## 2021-05-16 DIAGNOSIS — M4802 Spinal stenosis, cervical region: Secondary | ICD-10-CM | POA: Diagnosis not present

## 2021-05-16 DIAGNOSIS — J918 Pleural effusion in other conditions classified elsewhere: Secondary | ICD-10-CM | POA: Diagnosis not present

## 2021-05-16 DIAGNOSIS — J9611 Chronic respiratory failure with hypoxia: Secondary | ICD-10-CM

## 2021-05-16 DIAGNOSIS — M1030 Gout due to renal impairment, unspecified site: Secondary | ICD-10-CM | POA: Diagnosis not present

## 2021-05-16 DIAGNOSIS — J449 Chronic obstructive pulmonary disease, unspecified: Secondary | ICD-10-CM | POA: Diagnosis not present

## 2021-05-16 DIAGNOSIS — I130 Hypertensive heart and chronic kidney disease with heart failure and stage 1 through stage 4 chronic kidney disease, or unspecified chronic kidney disease: Secondary | ICD-10-CM | POA: Diagnosis not present

## 2021-05-16 DIAGNOSIS — D472 Monoclonal gammopathy: Secondary | ICD-10-CM | POA: Diagnosis not present

## 2021-05-16 DIAGNOSIS — M85832 Other specified disorders of bone density and structure, left forearm: Secondary | ICD-10-CM | POA: Diagnosis not present

## 2021-05-16 DIAGNOSIS — D519 Vitamin B12 deficiency anemia, unspecified: Secondary | ICD-10-CM | POA: Diagnosis not present

## 2021-05-16 DIAGNOSIS — M19071 Primary osteoarthritis, right ankle and foot: Secondary | ICD-10-CM | POA: Diagnosis not present

## 2021-05-16 DIAGNOSIS — M19072 Primary osteoarthritis, left ankle and foot: Secondary | ICD-10-CM | POA: Diagnosis not present

## 2021-05-16 DIAGNOSIS — E785 Hyperlipidemia, unspecified: Secondary | ICD-10-CM | POA: Diagnosis not present

## 2021-05-16 DIAGNOSIS — I2781 Cor pulmonale (chronic): Secondary | ICD-10-CM | POA: Diagnosis not present

## 2021-05-16 DIAGNOSIS — D630 Anemia in neoplastic disease: Secondary | ICD-10-CM | POA: Diagnosis not present

## 2021-05-16 DIAGNOSIS — I5042 Chronic combined systolic (congestive) and diastolic (congestive) heart failure: Secondary | ICD-10-CM | POA: Diagnosis not present

## 2021-05-16 DIAGNOSIS — U099 Post covid-19 condition, unspecified: Secondary | ICD-10-CM | POA: Diagnosis not present

## 2021-05-16 LAB — HEPATIC FUNCTION PANEL
ALT: 8 U/L (ref 0–35)
AST: 14 U/L (ref 0–37)
Albumin: 3.6 g/dL (ref 3.5–5.2)
Alkaline Phosphatase: 69 U/L (ref 39–117)
Bilirubin, Direct: 0 mg/dL (ref 0.0–0.3)
Total Bilirubin: 0.2 mg/dL (ref 0.2–1.2)
Total Protein: 7.5 g/dL (ref 6.0–8.3)

## 2021-05-16 LAB — CBC WITH DIFFERENTIAL/PLATELET
Basophils Absolute: 0.1 10*3/uL (ref 0.0–0.1)
Basophils Relative: 0.7 % (ref 0.0–3.0)
Eosinophils Absolute: 0.3 10*3/uL (ref 0.0–0.7)
Eosinophils Relative: 2.7 % (ref 0.0–5.0)
HCT: 32.7 % — ABNORMAL LOW (ref 36.0–46.0)
Hemoglobin: 10.6 g/dL — ABNORMAL LOW (ref 12.0–15.0)
Lymphocytes Relative: 11 % — ABNORMAL LOW (ref 12.0–46.0)
Lymphs Abs: 1.4 10*3/uL (ref 0.7–4.0)
MCHC: 32.5 g/dL (ref 30.0–36.0)
MCV: 78.5 fl (ref 78.0–100.0)
Monocytes Absolute: 1 10*3/uL (ref 0.1–1.0)
Monocytes Relative: 8 % (ref 3.0–12.0)
Neutro Abs: 9.6 10*3/uL — ABNORMAL HIGH (ref 1.4–7.7)
Neutrophils Relative %: 77.6 % — ABNORMAL HIGH (ref 43.0–77.0)
Platelets: 325 10*3/uL (ref 150.0–400.0)
RBC: 4.16 Mil/uL (ref 3.87–5.11)
RDW: 14 % (ref 11.5–15.5)
WBC: 12.4 10*3/uL — ABNORMAL HIGH (ref 4.0–10.5)

## 2021-05-16 LAB — BASIC METABOLIC PANEL
BUN: 29 mg/dL — ABNORMAL HIGH (ref 6–23)
CO2: 32 mEq/L (ref 19–32)
Calcium: 10 mg/dL (ref 8.4–10.5)
Chloride: 100 mEq/L (ref 96–112)
Creatinine, Ser: 1.17 mg/dL (ref 0.40–1.20)
GFR: 43.97 mL/min — ABNORMAL LOW (ref >60.00)
Glucose, Bld: 103 mg/dL — ABNORMAL HIGH (ref 70–99)
Potassium: 4.3 mEq/L (ref 3.5–5.1)
Sodium: 139 mEq/L (ref 135–145)

## 2021-05-16 MED ORDER — MORPHINE SULFATE ER 15 MG PO TBCR: 15.0000 mg | EXTENDED_RELEASE_TABLET | Freq: Two times a day (BID) | ORAL | 0 refills | Status: DC | PRN

## 2021-05-16 NOTE — Patient Instructions (Addendum)
Please take all new medication as prescribed - the morphine as needed  Please continue all other medications as before.  Please have the pharmacy call with any other refills you may need.  Please keep your appointments with your specialists as you may have planned  You will be contacted regarding the referral for: Oncology  Please go to the LAB at the blood drawing area for the tests to be done  You will be contacted by phone if any changes need to be made immediately.  Otherwise, you will receive a letter about your results with an explanation, but please check with MyChart first.  Please remember to sign up for MyChart if you have not done so, as this will be important to you in the future with finding out test results, communicating by private email, and scheduling acute appointments online when needed.

## 2021-05-16 NOTE — Progress Notes (Signed)
Patient ID: [name], female   DOB: 1941-01-09, 80 y.o.   MRN: [id number]        Chief Complaint: follow up post hospn        HPI:  [name] is a 80 y.o. female here with hospn oct 6-11 with left pleural effusion and persistent left chest pain; did have thoracentesis with cytology c/w metastatic adenoCa and pt and family is notified of this today.  Pt denies increased sob or doe, wheezing, orthopnea, PND, increased LE swelling, palpitations, dizziness or syncope.  Pt denies polydipsia, polyuria, or new focal neuro s/s.        Wt Readings from Last 3 Encounters:  05/16/21 181 lb (82.1 kg)  05/05/21 179 lb 3.2 oz (81.3 kg)  05/03/21 180 lb (81.6 kg)   BP Readings from Last 3 Encounters:  05/16/21 (!) 148/64  05/09/21 (!) 168/50  05/03/21 (!) 183/84         Past Medical History:  Diagnosis Date   Allergy    Angiodysplasia of cecum    Angiodysplasia of duodenum    Arthritis    Asteatotic eczema 02/25/2016   Asthma    Atrial fibrillation ([place]) 10/17/2017   Blood transfusion without reported diagnosis    Cataract    CHF (congestive heart failure), NYHA class III, chronic, combined ([place]) 08/10/2017   Colonic polyp 02/16/2008   Tubular adenoma   COPD (chronic obstructive pulmonary disease) ([place]) 01/27/98   Cor pulmonale ([place]) 11/16/2014   GERD 07/30/1992   Gout    HTN (hypertension) 07/30/1988   Hyperlipidemia with target LDL less than 100 11/27/1996        Kidney stone 1960, 1972, 1991   MGUS (monoclonal gammopathy of unknown significance) 11/08/2016   Morbid obesity ([place]) 04/19/2010   She agrees to work on her lifestyle modifications to help her lose weight.    OSA (obstructive sleep apnea) 06/22/2013   Osteopenia, senile 02/05/2013   July 2014  -2.1 left femur -1.9 left forearm    Prediabetes 11/13/2006   Renal insufficiency    Stenosis of cervical spine with myelopathy (HCC) 01/01/2018   Ulcer of leg, chronic, right ([place]) 10/10/2011   ULCERATIVE COLITIS-LEFT SIDE 03/19/2008         Vitamin B12 deficiency anemia 11/13/2006        Vitamin D deficiency 02/06/2013   Past Surgical History:  Procedure Laterality Date   BRONCHOSCOPY     COLONOSCOPY     COLONOSCOPY WITH PROPOFOL N/A 02/23/2015   Procedure: COLONOSCOPY WITH PROPOFOL;  Surgeon: Ladene Artist, MD;  Location: [facility] ENDOSCOPY;  Service: Endoscopy;  Laterality: N/A;   COLONOSCOPY WITH PROPOFOL N/A 08/12/2018   Procedure: COLONOSCOPY WITH PROPOFOL;  Surgeon: Ladene Artist, MD;  Location: [facility];  Service: Endoscopy;  Laterality: N/A;   COLONOSCOPY WITH PROPOFOL N/A 05/09/2020   Procedure: COLONOSCOPY WITH PROPOFOL;  Surgeon: Ladene Artist, MD;  Location: [facility];  Service: Endoscopy;  Laterality: N/A;  To Splenic Flexture   ESOPHAGOGASTRODUODENOSCOPY (EGD) WITH PROPOFOL N/A 02/23/2015   Procedure: ESOPHAGOGASTRODUODENOSCOPY (EGD) WITH PROPOFOL;  Surgeon: Ladene Artist, MD;  Location: [facility];  Service: Endoscopy;  Laterality: N/A;   ESOPHAGOGASTRODUODENOSCOPY (EGD) WITH PROPOFOL N/A 08/12/2018   Procedure: ESOPHAGOGASTRODUODENOSCOPY (EGD) WITH PROPOFOL;  Surgeon: Ladene Artist, MD;  Location: [facility];  Service: Endoscopy;  Laterality: N/A;   ESOPHAGOGASTRODUODENOSCOPY (EGD) WITH PROPOFOL N/A 05/08/2020   Procedure: ESOPHAGOGASTRODUODENOSCOPY (EGD) WITH PROPOFOL;  Surgeon: Yetta Flock, MD;  Location: [facility];  Service: Gastroenterology;  Laterality: N/A;   HOT HEMOSTASIS N/A 08/12/2018   Procedure: HOT HEMOSTASIS (ARGON PLASMA COAGULATION/BICAP);  Surgeon: Ladene Artist, MD;  Location: [facility];  Service: Endoscopy;  Laterality: N/A;  EGD and Colon APC   HOT HEMOSTASIS N/A 05/08/2020   Procedure: HOT HEMOSTASIS (ARGON PLASMA COAGULATION/BICAP);  Surgeon: Yetta Flock, MD;  Location: [facility];  Service: Gastroenterology;  Laterality: N/A;   LAPAROTOMY N/A 08/11/2020   Procedure: EXPLORATORY LAPAROTOMY PARTIAL COLECTOMY AND COLOSTOMY WITH RESECTION OF A  MESSENTERIC MASS;  Surgeon: Coralie Keens, MD;  Location: [facility];  Service: General;  Laterality: N/A;   POLYPECTOMY      reports that she quit smoking about 24 years ago. Her smoking use included cigarettes. She has never used smokeless tobacco. She reports that she does not drink alcohol and does not use drugs. family history includes Cervical cancer in her sister; Clotting disorder in her sister; Crohn's disease in her brother; Diabetes in her mother and sister; Heart attack in her sister; Heart disease in her brother, brother, mother, and sister; Kidney cancer in her mother; Lung cancer in her sister; Stomach cancer in her mother; Stroke in her father and mother. Allergies  Allergen Reactions   Celebrex [Celecoxib]     edema   Amlodipine Besylate Other (See Comments)    REACTION: edema   Enalapril Cough   Lipitor [Atorvastatin]     Muscle aches   Amoxicillin Diarrhea    DID THE REACTION INVOLVE: Swelling of the face/tongue/throat, SOB, or low BP? N Sudden or severe rash/hives, skin peeling, or the inside of the mouth or nose? N Did it require medical treatment? N When did it last happen?      MORE THAN 10 YEARS If all above answers are "NO", may proceed with cephalosporin use.    Codeine Sulfate Nausea Only   Hydrocodone-Acetaminophen Nausea Only   Current Outpatient Medications on File Prior to Visit  Medication Sig Dispense Refill   acetaminophen (TYLENOL) 325 MG tablet Take 325-650 mg by mouth daily as needed for moderate pain, fever or headache.      albuterol (PROAIR HFA) 108 (90 Base) MCG/ACT inhaler Inhale 1-2 puffs into the lungs every 6 (six) hours as needed for wheezing or shortness of breath. 18 g 11   allopurinol (ZYLOPRIM) 100 MG tablet TAKE 1 TABLET(100 MG) BY MOUTH DAILY (Patient taking differently: Take 100 mg by mouth daily.) 90 tablet 1   ferrous sulfate 325 (65 FE) MG tablet Take 1 tablet (325 mg total) by mouth 2 (two) times daily with a meal. 180 tablet 1    Fluticasone-Umeclidin-Vilant (TRELEGY ELLIPTA) 100-62.5-25 MCG/INH AEPB Inhale 1 puff into the lungs daily. 120 each 1   guaiFENesin-dextromethorphan (ROBITUSSIN DM) 100-10 MG/5ML syrup Take 5 mLs by mouth every 4 (four) hours as needed for cough (chest congestion). 118 mL 0   isosorbide-hydrALAZINE (BIDIL) 20-37.5 MG tablet TAKE 1 TABLET BY MOUTH THREE TIMES DAILY (Patient taking differently: Take 1 tablet by mouth 2 (two) times daily.) 270 tablet 1   torsemide (DEMADEX) 20 MG tablet Take 1 tablet (20 mg total) by mouth 2 (two) times daily. (Patient taking differently: Take 40 mg by mouth every morning.) 180 tablet 1   Budeson-Glycopyrrol-Formoterol (BREZTRI AEROSPHERE) 160-9-4.8 MCG/ACT AERO Inhale 2 puffs into the lungs 2 (two) times daily. (Patient not taking: No sig reported) 32.1 g 1   Dietary Management Product (VASCULERA) TABS Take 1 capsule by mouth daily. (Patient not taking: No sig reported) 90  tablet 1   Current Facility-Administered Medications on File Prior to Visit  Medication Dose Route Frequency Provider Last Rate Last Admin   cyanocobalamin ((VITAMIN B-12)) injection 1,000 mcg  1,000 mcg Intramuscular Q30 days [name], MD   1,000 mcg at 01/31/21 1507        ROS:  All others reviewed and negative.  Objective        PE:  BP (!) 148/64 (BP Location: Left Arm, Patient Position: Sitting, Cuff Size: Large)   Pulse 67   Ht 5' (1.524 m)   Wt 181 lb (82.1 kg)   SpO2 91%   BMI 35.35 kg/m                 Constitutional: Pt appears in NAD               HENT: Head: NCAT.                Right Ear: External ear normal.                 Left Ear: External ear normal.                Eyes: . Pupils are equal, round, and reactive to light. Conjunctivae and EOM are normal               Nose: without d/c or deformity               Neck: Neck supple. Gross normal ROM               Cardiovascular: Normal rate and regular rhythm.                 Pulmonary/Chest: Effort normal and  breath sounds without rales or wheezing.                Abd:  Soft, NT, ND, + BS, no organomegaly               Neurological: Pt is alert. At baseline orientation, motor grossly intact               Skin: Skin is warm. No rashes, no other new lesions, LE edema - none               Psychiatric: Pt behavior is normal without agitation   Micro: none  Cardiac tracings I have personally interpreted today:  none  Pertinent Radiological findings (summarize): none   Lab Results  Component Value Date   WBC 12.4 (H) 05/16/2021   HGB 10.6 (L) 05/16/2021   HCT 32.7 (L) 05/16/2021   PLT 325.0 05/16/2021   GLUCOSE 103 (H) 05/16/2021   CHOL 165 02/04/2020   TRIG 162 (H) 02/04/2020   HDL 49 (L) 02/04/2020   LDLDIRECT 107.0 09/04/2016   LDLCALC 90 02/04/2020   ALT 8 05/16/2021   AST 14 05/16/2021   NA 139 05/16/2021   K 4.3 05/16/2021   CL 100 05/16/2021   CREATININE 1.17 05/16/2021   BUN 29 (H) 05/16/2021   CO2 32 05/16/2021   TSH 2.35 07/02/2019   INR 1.1 05/04/2021   HGBA1C 5.4 11/05/2017   MICROALBUR <0.7 11/05/2017   Assessment/Plan:  [name] is a 80 y.o. White or Caucasian [1] female with  has a past medical history of Allergy, Angiodysplasia of cecum, Angiodysplasia of duodenum, Arthritis, Asteatotic eczema (02/25/2016), Asthma, Atrial fibrillation ([place]) (10/17/2017), Blood transfusion without reported diagnosis, Cataract, CHF (congestive heart failure), NYHA class  III, chronic, combined ([place]) (08/10/2017), Colonic polyp (02/16/2008), COPD (chronic obstructive pulmonary disease) ([place]) (01/27/98), Cor pulmonale ([place]) (11/16/2014), GERD (07/30/1992), Gout, HTN (hypertension) (07/30/1988), Hyperlipidemia with target LDL less than 100 (11/27/1996), Kidney stone (1960, 1972, 1991), MGUS (monoclonal gammopathy of unknown significance) (11/08/2016), Morbid obesity ([place]) (04/19/2010), OSA (obstructive sleep apnea) (06/22/2013), Osteopenia, senile (02/05/2013), Prediabetes (11/13/2006), Renal  insufficiency, Stenosis of cervical spine with myelopathy ([place]) (01/01/2018), Ulcer of leg, chronic, right ([place]) (10/10/2011), ULCERATIVE COLITIS-LEFT SIDE (03/19/2008), Vitamin B12 deficiency anemia (11/13/2006), and Vitamin D deficiency (02/06/2013).  Metastatic adenocarcinoma ([place]) D/w pt and family today - for referral oncology, ms contin 15 bid prn  Chronic kidney disease, stage 3b ([place]) Lab Results  Component Value Date   CREATININE 1.17 05/16/2021   Stable overall, cont to avoid nephrotoxins   Chronic respiratory failure with hypoxia (HCC) Stable overall, cont home o2  HTN (hypertension) . BP Readings from Last 3 Encounters:  05/16/21 (!) 148/64  05/09/21 (!) 168/50  05/03/21 (!) 183/84   Mild uncontrolled, pt to continue medical treatment hydralazine, lopressor - declines change today  Followup: Return in about 3 months (around 08/16/2021), or if symptoms worsen or fail to improve, for to see Dr Ronnald Ramp.  Cathlean Cower, MD 05/21/2021 10:00 PM [facility]

## 2021-05-17 NOTE — Telephone Encounter (Signed)
I think ok to continue all treatment no change for now, but should f/u with pCP regarding depression that may need treatment

## 2021-05-17 NOTE — Telephone Encounter (Signed)
[name] from [facility] called in asking if patient should continue using the breztri inhaler or not. He also stated the patient has chronic discoloration in her ankles and that she has cellulitis patient wants to know if she does have cellulitis what she can do about it.

## 2021-05-18 DIAGNOSIS — I878 Other specified disorders of veins: Secondary | ICD-10-CM | POA: Diagnosis not present

## 2021-05-18 DIAGNOSIS — I5042 Chronic combined systolic (congestive) and diastolic (congestive) heart failure: Secondary | ICD-10-CM | POA: Diagnosis not present

## 2021-05-18 DIAGNOSIS — H539 Unspecified visual disturbance: Secondary | ICD-10-CM | POA: Diagnosis not present

## 2021-05-18 DIAGNOSIS — E785 Hyperlipidemia, unspecified: Secondary | ICD-10-CM | POA: Diagnosis not present

## 2021-05-18 DIAGNOSIS — I130 Hypertensive heart and chronic kidney disease with heart failure and stage 1 through stage 4 chronic kidney disease, or unspecified chronic kidney disease: Secondary | ICD-10-CM | POA: Diagnosis not present

## 2021-05-18 DIAGNOSIS — D631 Anemia in chronic kidney disease: Secondary | ICD-10-CM | POA: Diagnosis not present

## 2021-05-18 DIAGNOSIS — M19072 Primary osteoarthritis, left ankle and foot: Secondary | ICD-10-CM | POA: Diagnosis not present

## 2021-05-18 DIAGNOSIS — U099 Post covid-19 condition, unspecified: Secondary | ICD-10-CM | POA: Diagnosis not present

## 2021-05-18 DIAGNOSIS — J918 Pleural effusion in other conditions classified elsewhere: Secondary | ICD-10-CM | POA: Diagnosis not present

## 2021-05-18 DIAGNOSIS — M479 Spondylosis, unspecified: Secondary | ICD-10-CM | POA: Diagnosis not present

## 2021-05-18 DIAGNOSIS — N1832 Chronic kidney disease, stage 3b: Secondary | ICD-10-CM | POA: Diagnosis not present

## 2021-05-18 DIAGNOSIS — M85832 Other specified disorders of bone density and structure, left forearm: Secondary | ICD-10-CM | POA: Diagnosis not present

## 2021-05-18 DIAGNOSIS — M1030 Gout due to renal impairment, unspecified site: Secondary | ICD-10-CM | POA: Diagnosis not present

## 2021-05-18 DIAGNOSIS — M19071 Primary osteoarthritis, right ankle and foot: Secondary | ICD-10-CM | POA: Diagnosis not present

## 2021-05-18 DIAGNOSIS — J449 Chronic obstructive pulmonary disease, unspecified: Secondary | ICD-10-CM | POA: Diagnosis not present

## 2021-05-18 DIAGNOSIS — G4733 Obstructive sleep apnea (adult) (pediatric): Secondary | ICD-10-CM | POA: Diagnosis not present

## 2021-05-18 DIAGNOSIS — I2781 Cor pulmonale (chronic): Secondary | ICD-10-CM | POA: Diagnosis not present

## 2021-05-18 DIAGNOSIS — M810 Age-related osteoporosis without current pathological fracture: Secondary | ICD-10-CM | POA: Diagnosis not present

## 2021-05-18 DIAGNOSIS — M4802 Spinal stenosis, cervical region: Secondary | ICD-10-CM | POA: Diagnosis not present

## 2021-05-18 DIAGNOSIS — I700 Atherosclerosis of aorta: Secondary | ICD-10-CM | POA: Diagnosis not present

## 2021-05-18 DIAGNOSIS — K219 Gastro-esophageal reflux disease without esophagitis: Secondary | ICD-10-CM | POA: Diagnosis not present

## 2021-05-18 DIAGNOSIS — D630 Anemia in neoplastic disease: Secondary | ICD-10-CM | POA: Diagnosis not present

## 2021-05-18 DIAGNOSIS — D472 Monoclonal gammopathy: Secondary | ICD-10-CM | POA: Diagnosis not present

## 2021-05-18 DIAGNOSIS — J9621 Acute and chronic respiratory failure with hypoxia: Secondary | ICD-10-CM | POA: Diagnosis not present

## 2021-05-18 DIAGNOSIS — D519 Vitamin B12 deficiency anemia, unspecified: Secondary | ICD-10-CM | POA: Diagnosis not present

## 2021-05-18 NOTE — Telephone Encounter (Signed)
Called pt, LVM to schedule OV with PCP to discuss.

## 2021-05-19 ENCOUNTER — Telehealth: Payer: Self-pay | Admitting: *Deleted

## 2021-05-19 DIAGNOSIS — R918 Other nonspecific abnormal finding of lung field: Secondary | ICD-10-CM

## 2021-05-19 NOTE — Telephone Encounter (Signed)
I called [name] and set her up to be seen next week with Dr. Julien Nordmann. She verbalized understanding of appt time and place.

## 2021-05-21 ENCOUNTER — Encounter: Payer: Self-pay | Admitting: Internal Medicine

## 2021-05-21 DIAGNOSIS — C799 Secondary malignant neoplasm of unspecified site: Secondary | ICD-10-CM | POA: Insufficient documentation

## 2021-05-21 NOTE — Assessment & Plan Note (Signed)
.   BP Readings from Last 3 Encounters:  05/16/21 (!) 148/64  05/09/21 (!) 168/50  05/03/21 (!) 183/84   Mild uncontrolled, pt to continue medical treatment hydralazine, lopressor - declines change today

## 2021-05-21 NOTE — Assessment & Plan Note (Signed)
Stable overall, cont home o2

## 2021-05-21 NOTE — Assessment & Plan Note (Signed)
Lab Results  Component Value Date   CREATININE 1.17 05/16/2021   Stable overall, cont to avoid nephrotoxins

## 2021-05-21 NOTE — Assessment & Plan Note (Signed)
D/w pt and family today - for referral oncology, ms contin 15 bid prn

## 2021-05-23 ENCOUNTER — Encounter: Payer: Self-pay | Admitting: Internal Medicine

## 2021-05-23 ENCOUNTER — Other Ambulatory Visit: Payer: Self-pay

## 2021-05-23 ENCOUNTER — Ambulatory Visit (INDEPENDENT_AMBULATORY_CARE_PROVIDER_SITE_OTHER): Payer: Medicare Other | Admitting: Pharmacist

## 2021-05-23 ENCOUNTER — Inpatient Hospital Stay: Payer: Medicare Other

## 2021-05-23 ENCOUNTER — Encounter: Payer: Self-pay | Admitting: *Deleted

## 2021-05-23 ENCOUNTER — Inpatient Hospital Stay (HOSPITAL_BASED_OUTPATIENT_CLINIC_OR_DEPARTMENT_OTHER): Payer: Medicare Other | Admitting: *Deleted

## 2021-05-23 ENCOUNTER — Inpatient Hospital Stay: Payer: Medicare Other | Attending: Internal Medicine | Admitting: Internal Medicine

## 2021-05-23 VITALS — BP 139/42 | HR 62 | Temp 96.6°F | Resp 17 | Wt 180.4 lb

## 2021-05-23 DIAGNOSIS — M810 Age-related osteoporosis without current pathological fracture: Secondary | ICD-10-CM | POA: Diagnosis not present

## 2021-05-23 DIAGNOSIS — Z79899 Other long term (current) drug therapy: Secondary | ICD-10-CM | POA: Insufficient documentation

## 2021-05-23 DIAGNOSIS — J9611 Chronic respiratory failure with hypoxia: Secondary | ICD-10-CM

## 2021-05-23 DIAGNOSIS — I110 Hypertensive heart disease with heart failure: Secondary | ICD-10-CM | POA: Insufficient documentation

## 2021-05-23 DIAGNOSIS — K219 Gastro-esophageal reflux disease without esophagitis: Secondary | ICD-10-CM | POA: Diagnosis not present

## 2021-05-23 DIAGNOSIS — E785 Hyperlipidemia, unspecified: Secondary | ICD-10-CM | POA: Diagnosis not present

## 2021-05-23 DIAGNOSIS — J918 Pleural effusion in other conditions classified elsewhere: Secondary | ICD-10-CM | POA: Diagnosis not present

## 2021-05-23 DIAGNOSIS — M19071 Primary osteoarthritis, right ankle and foot: Secondary | ICD-10-CM | POA: Diagnosis not present

## 2021-05-23 DIAGNOSIS — M19072 Primary osteoarthritis, left ankle and foot: Secondary | ICD-10-CM | POA: Diagnosis not present

## 2021-05-23 DIAGNOSIS — Z87891 Personal history of nicotine dependence: Secondary | ICD-10-CM | POA: Diagnosis not present

## 2021-05-23 DIAGNOSIS — C799 Secondary malignant neoplasm of unspecified site: Secondary | ICD-10-CM

## 2021-05-23 DIAGNOSIS — I5042 Chronic combined systolic (congestive) and diastolic (congestive) heart failure: Secondary | ICD-10-CM | POA: Diagnosis not present

## 2021-05-23 DIAGNOSIS — H539 Unspecified visual disturbance: Secondary | ICD-10-CM | POA: Diagnosis not present

## 2021-05-23 DIAGNOSIS — M1030 Gout due to renal impairment, unspecified site: Secondary | ICD-10-CM | POA: Diagnosis not present

## 2021-05-23 DIAGNOSIS — J449 Chronic obstructive pulmonary disease, unspecified: Secondary | ICD-10-CM | POA: Diagnosis not present

## 2021-05-23 DIAGNOSIS — M8580 Other specified disorders of bone density and structure, unspecified site: Secondary | ICD-10-CM | POA: Diagnosis not present

## 2021-05-23 DIAGNOSIS — D631 Anemia in chronic kidney disease: Secondary | ICD-10-CM | POA: Diagnosis not present

## 2021-05-23 DIAGNOSIS — D519 Vitamin B12 deficiency anemia, unspecified: Secondary | ICD-10-CM | POA: Diagnosis not present

## 2021-05-23 DIAGNOSIS — M479 Spondylosis, unspecified: Secondary | ICD-10-CM | POA: Diagnosis not present

## 2021-05-23 DIAGNOSIS — J9621 Acute and chronic respiratory failure with hypoxia: Secondary | ICD-10-CM | POA: Diagnosis not present

## 2021-05-23 DIAGNOSIS — M4802 Spinal stenosis, cervical region: Secondary | ICD-10-CM | POA: Diagnosis not present

## 2021-05-23 DIAGNOSIS — J910 Malignant pleural effusion: Secondary | ICD-10-CM

## 2021-05-23 DIAGNOSIS — M85832 Other specified disorders of bone density and structure, left forearm: Secondary | ICD-10-CM | POA: Diagnosis not present

## 2021-05-23 DIAGNOSIS — I10 Essential (primary) hypertension: Secondary | ICD-10-CM

## 2021-05-23 DIAGNOSIS — I130 Hypertensive heart and chronic kidney disease with heart failure and stage 1 through stage 4 chronic kidney disease, or unspecified chronic kidney disease: Secondary | ICD-10-CM | POA: Diagnosis not present

## 2021-05-23 DIAGNOSIS — I480 Paroxysmal atrial fibrillation: Secondary | ICD-10-CM

## 2021-05-23 DIAGNOSIS — N1832 Chronic kidney disease, stage 3b: Secondary | ICD-10-CM

## 2021-05-23 DIAGNOSIS — I4891 Unspecified atrial fibrillation: Secondary | ICD-10-CM | POA: Insufficient documentation

## 2021-05-23 DIAGNOSIS — C3432 Malignant neoplasm of lower lobe, left bronchus or lung: Secondary | ICD-10-CM | POA: Diagnosis not present

## 2021-05-23 DIAGNOSIS — I509 Heart failure, unspecified: Secondary | ICD-10-CM | POA: Insufficient documentation

## 2021-05-23 DIAGNOSIS — D472 Monoclonal gammopathy: Secondary | ICD-10-CM | POA: Diagnosis not present

## 2021-05-23 DIAGNOSIS — R918 Other nonspecific abnormal finding of lung field: Secondary | ICD-10-CM

## 2021-05-23 DIAGNOSIS — C3490 Malignant neoplasm of unspecified part of unspecified bronchus or lung: Secondary | ICD-10-CM

## 2021-05-23 DIAGNOSIS — I2781 Cor pulmonale (chronic): Secondary | ICD-10-CM | POA: Diagnosis not present

## 2021-05-23 DIAGNOSIS — I700 Atherosclerosis of aorta: Secondary | ICD-10-CM | POA: Diagnosis not present

## 2021-05-23 DIAGNOSIS — D630 Anemia in neoplastic disease: Secondary | ICD-10-CM | POA: Diagnosis not present

## 2021-05-23 DIAGNOSIS — U099 Post covid-19 condition, unspecified: Secondary | ICD-10-CM | POA: Diagnosis not present

## 2021-05-23 DIAGNOSIS — G4733 Obstructive sleep apnea (adult) (pediatric): Secondary | ICD-10-CM | POA: Diagnosis not present

## 2021-05-23 DIAGNOSIS — I878 Other specified disorders of veins: Secondary | ICD-10-CM | POA: Diagnosis not present

## 2021-05-23 LAB — CMP (CANCER CENTER ONLY)
ALT: <5 U/L (ref 0–44)
AST: 13 U/L — ABNORMAL LOW (ref 15–41)
Albumin: 3.1 g/dL — ABNORMAL LOW (ref 3.5–5.0)
Alkaline Phosphatase: 67 U/L (ref 38–126)
Anion gap: 9 (ref 5–15)
BUN: 25 mg/dL — ABNORMAL HIGH (ref 8–23)
CO2: 31 mmol/L (ref 22–32)
Calcium: 9.7 mg/dL (ref 8.9–10.3)
Chloride: 99 mmol/L (ref 98–111)
Creatinine: 1.28 mg/dL — ABNORMAL HIGH (ref 0.44–1.00)
GFR, Estimated: 42 mL/min — ABNORMAL LOW (ref >60)
Glucose, Bld: 111 mg/dL — ABNORMAL HIGH (ref 70–99)
Potassium: 4.5 mmol/L (ref 3.5–5.1)
Sodium: 139 mmol/L (ref 135–145)
Total Bilirubin: 0.3 mg/dL (ref 0.3–1.2)
Total Protein: 7.2 g/dL (ref 6.5–8.1)

## 2021-05-23 LAB — CBC WITH DIFFERENTIAL (CANCER CENTER ONLY)
Abs Immature Granulocytes: 0.05 10*3/uL (ref 0.00–0.07)
Basophils Absolute: 0.1 10*3/uL (ref 0.0–0.1)
Basophils Relative: 0 %
Eosinophils Absolute: 0.3 10*3/uL (ref 0.0–0.5)
Eosinophils Relative: 3 %
HCT: 32.2 % — ABNORMAL LOW (ref 36.0–46.0)
Hemoglobin: 9.9 g/dL — ABNORMAL LOW (ref 12.0–15.0)
Immature Granulocytes: 0 %
Lymphocytes Relative: 10 %
Lymphs Abs: 1.2 10*3/uL (ref 0.7–4.0)
MCH: 25.4 pg — ABNORMAL LOW (ref 26.0–34.0)
MCHC: 30.7 g/dL (ref 30.0–36.0)
MCV: 82.6 fL (ref 80.0–100.0)
Monocytes Absolute: 1 10*3/uL (ref 0.1–1.0)
Monocytes Relative: 8 %
Neutro Abs: 9.5 10*3/uL — ABNORMAL HIGH (ref 1.7–7.7)
Neutrophils Relative %: 79 %
Platelet Count: 257 10*3/uL (ref 150–400)
RBC: 3.9 MIL/uL (ref 3.87–5.11)
RDW: 13.9 % (ref 11.5–15.5)
WBC Count: 12.1 10*3/uL — ABNORMAL HIGH (ref 4.0–10.5)
nRBC: 0 % (ref 0.0–0.2)

## 2021-05-23 NOTE — Progress Notes (Signed)
Oncology Nurse Navigator Documentation  Oncology Nurse Navigator Flowsheets 05/23/2021  Abnormal Finding Date 05/03/2021  Confirmed Diagnosis Date 05/04/2021  Diagnosis Status Pending Molecular Studies  Navigator Follow Up Date: 05/25/2021  Navigator Follow Up Reason: Appointment Review  Navigator Location [facility]  Navigator Encounter Type Clinic/MDC;Initial MedOnc  Patient Visit Type Initial;MedOnc  Treatment Phase Pre-Tx/Tx Discussion  Barriers/Navigation Needs Coordination of Care;Education  Education Newly Diagnosed Cancer Education;Other  Interventions Coordination of Care;Disability/FMLA;Psycho-Social Support  Acuity Level 3-Moderate Needs (3-4 Barriers Identified)  Coordination of Care Appts  Time Spent with Patient 45

## 2021-05-23 NOTE — Progress Notes (Signed)
I contacted pathology to see if there is enough cytology to send for molecular and PDL 1 testing.  Wait for response.

## 2021-05-23 NOTE — Progress Notes (Signed)
[place] [phone]   [phone]  CONSULT NOTE  REFERRING PHYSICIAN: Dr. [name]  REASON FOR CONSULTATION:  80 years old white female recently diagnosed with lung cancer.  HPI [name] is a 80 y.o. female with past medical history significant for multiple medical problems including history of congestive heart failure, atrial fibrillation, osteoarthritis, angiodysplasia of the duodenum and cecum, COPD, GERD, hypertension, dyslipidemia, MGUS, kidney stones, obstructive sleep apnea, renal insufficiency as well as prediabetes, ulcerative colitis, vitamin B12 deficiency as well as vitamin D deficiency.  The patient presented to the emergency department complaining of cough of 2 weeks duration.  She was found to have decreased breath sounds.  Chest x-ray was performed on May 03, 2021 and that showed enlarged with moderate size left pleural effusion with left lower lobe consolidation or mass.  The patient had ultrasound-guided left thoracentesis with drainage of 850 cc of clear yellow fluids.  Postprocedure chest x-ray showed possible small pneumothorax.  The cytology from the pleural fluid ([id number]) showed malignant cells consistent with metastatic adenocarcinoma. Immunohistochemical stain for TTF-1 is positive in the tumor cells, consistent with a lung primary.  Dr. Ronnald Ramp kindly referred the patient to me today for evaluation and recommendation regarding her condition. When seen today the patient is feeling fine except for the pain on the left side of the chest and she takes Tylenol on as-needed basis.  She has prescription for morphine sulfate but she does not take it.  She also has shortness of breath at baseline increased with exertion and cough with no hemoptysis.  She has no significant weight loss or night sweats.  She has occasional nausea but no vomiting, diarrhea or constipation.  She has intermittent headache with no visual  changes. Family history significant for father with stroke.  Mother had renal cell carcinoma.  Sister had metastatic lung cancer and nephew had metastatic melanoma. The patient is married and has 3 children.  She lives at home with her husband and older disabled daughter.  She has 3 grandchildren and 6 great grandchildren.  She was accompanied today by her granddaughter Lauren.  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  She has a history of smoking for over 40 years but quit in 2016.  She has no history of alcohol or drug abuse.  HPI  Past Medical History:  Diagnosis Date   Allergy    Angiodysplasia of cecum    Angiodysplasia of duodenum    Arthritis    Asteatotic eczema 02/25/2016   Asthma    Atrial fibrillation ([place]) 10/17/2017   Blood transfusion without reported diagnosis    Cataract    CHF (congestive heart failure), NYHA class III, chronic, combined ([place]) 08/10/2017   Colonic polyp 02/16/2008   Tubular adenoma   COPD (chronic obstructive pulmonary disease) ([place]) 01/27/98   Cor pulmonale ([place]) 11/16/2014   GERD 07/30/1992   Gout    HTN (hypertension) 07/30/1988   Hyperlipidemia with target LDL less than 100 11/27/1996        Kidney stone 1960, 1972, 1991   MGUS (monoclonal gammopathy of unknown significance) 11/08/2016   Morbid obesity ([place]) 04/19/2010   She agrees to work on her lifestyle modifications to help her lose weight.    OSA (obstructive sleep apnea) 06/22/2013   Osteopenia, senile 02/05/2013   July 2014  -2.1 left femur -1.9 left forearm    Prediabetes 11/13/2006   Renal insufficiency    Stenosis of cervical spine  with myelopathy ([place]) 01/01/2018   Ulcer of leg, chronic, right ([place]) 10/10/2011   ULCERATIVE COLITIS-LEFT SIDE 03/19/2008        Vitamin B12 deficiency anemia 11/13/2006        Vitamin D deficiency 02/06/2013    Past Surgical History:  Procedure Laterality Date   BRONCHOSCOPY     COLONOSCOPY     COLONOSCOPY WITH PROPOFOL N/A 02/23/2015   Procedure:  COLONOSCOPY WITH PROPOFOL;  Surgeon: Ladene Artist, MD;  Location: [facility];  Service: Endoscopy;  Laterality: N/A;   COLONOSCOPY WITH PROPOFOL N/A 08/12/2018   Procedure: COLONOSCOPY WITH PROPOFOL;  Surgeon: Ladene Artist, MD;  Location: [facility];  Service: Endoscopy;  Laterality: N/A;   COLONOSCOPY WITH PROPOFOL N/A 05/09/2020   Procedure: COLONOSCOPY WITH PROPOFOL;  Surgeon: Ladene Artist, MD;  Location: [facility];  Service: Endoscopy;  Laterality: N/A;  To Splenic Flexture   ESOPHAGOGASTRODUODENOSCOPY (EGD) WITH PROPOFOL N/A 02/23/2015   Procedure: ESOPHAGOGASTRODUODENOSCOPY (EGD) WITH PROPOFOL;  Surgeon: Ladene Artist, MD;  Location: [facility];  Service: Endoscopy;  Laterality: N/A;   ESOPHAGOGASTRODUODENOSCOPY (EGD) WITH PROPOFOL N/A 08/12/2018   Procedure: ESOPHAGOGASTRODUODENOSCOPY (EGD) WITH PROPOFOL;  Surgeon: Ladene Artist, MD;  Location: [facility];  Service: Endoscopy;  Laterality: N/A;   ESOPHAGOGASTRODUODENOSCOPY (EGD) WITH PROPOFOL N/A 05/08/2020   Procedure: ESOPHAGOGASTRODUODENOSCOPY (EGD) WITH PROPOFOL;  Surgeon: Yetta Flock, MD;  Location: [facility];  Service: Gastroenterology;  Laterality: N/A;   HOT HEMOSTASIS N/A 08/12/2018   Procedure: HOT HEMOSTASIS (ARGON PLASMA COAGULATION/BICAP);  Surgeon: Ladene Artist, MD;  Location: [facility];  Service: Endoscopy;  Laterality: N/A;  EGD and Colon APC   HOT HEMOSTASIS N/A 05/08/2020   Procedure: HOT HEMOSTASIS (ARGON PLASMA COAGULATION/BICAP);  Surgeon: Yetta Flock, MD;  Location: [facility];  Service: Gastroenterology;  Laterality: N/A;   LAPAROTOMY N/A 08/11/2020   Procedure: EXPLORATORY LAPAROTOMY PARTIAL COLECTOMY AND COLOSTOMY WITH RESECTION OF A MESSENTERIC MASS;  Surgeon: Coralie Keens, MD;  Location: [facility];  Service: General;  Laterality: N/A;   POLYPECTOMY      Family History  Problem Relation Age of Onset   Stroke Mother    Diabetes Mother    Kidney cancer Mother     Stomach cancer Mother    Heart disease Mother    Stroke Father    Heart disease Brother    Heart disease Brother    Crohn's disease Brother    Heart disease Sister        CATH,STENT   Clotting disorder Sister    Cervical cancer Sister    Lung cancer Sister    Heart attack Sister    Diabetes Sister    Colon cancer Neg Hx    Esophageal cancer Neg Hx    Rectal cancer Neg Hx     Social History Social History   Tobacco Use   Smoking status: Former    Types: Cigarettes    Quit date: 06/01/1996    Years since quitting: 24.9   Smokeless tobacco: Never   Tobacco comments:    Quit 1998  Vaping Use   Vaping Use: Never used  Substance Use Topics   Alcohol use: No   Drug use: No    Allergies  Allergen Reactions   Celebrex [Celecoxib]     edema   Amlodipine Besylate Other (See Comments)    REACTION: edema   Enalapril Cough   Lipitor [Atorvastatin]     Muscle aches   Amoxicillin Diarrhea    DID THE  REACTION INVOLVE: Swelling of the face/tongue/throat, SOB, or low BP? N Sudden or severe rash/hives, skin peeling, or the inside of the mouth or nose? N Did it require medical treatment? N When did it last happen?      MORE THAN 10 YEARS If all above answers are "NO", may proceed with cephalosporin use.    Codeine Sulfate Nausea Only   Hydrocodone-Acetaminophen Nausea Only    Current Outpatient Medications  Medication Sig Dispense Refill   acetaminophen (TYLENOL) 325 MG tablet Take 325-650 mg by mouth daily as needed for moderate pain, fever or headache.      albuterol (PROAIR HFA) 108 (90 Base) MCG/ACT inhaler Inhale 1-2 puffs into the lungs every 6 (six) hours as needed for wheezing or shortness of breath. 18 g 11   allopurinol (ZYLOPRIM) 100 MG tablet TAKE 1 TABLET(100 MG) BY MOUTH DAILY (Patient taking differently: Take 100 mg by mouth daily.) 90 tablet 1   Budeson-Glycopyrrol-Formoterol (BREZTRI AEROSPHERE) 160-9-4.8 MCG/ACT AERO Inhale 2 puffs into the lungs 2 (two)  times daily. (Patient not taking: No sig reported) 32.1 g 1   Dietary Management Product (VASCULERA) TABS Take 1 capsule by mouth daily. (Patient not taking: No sig reported) 90 tablet 1   ferrous sulfate 325 (65 FE) MG tablet Take 1 tablet (325 mg total) by mouth 2 (two) times daily with a meal. 180 tablet 1   Fluticasone-Umeclidin-Vilant (TRELEGY ELLIPTA) 100-62.5-25 MCG/INH AEPB Inhale 1 puff into the lungs daily. 120 each 1   guaiFENesin-dextromethorphan (ROBITUSSIN DM) 100-10 MG/5ML syrup Take 5 mLs by mouth every 4 (four) hours as needed for cough (chest congestion). 118 mL 0   isosorbide-hydrALAZINE (BIDIL) 20-37.5 MG tablet TAKE 1 TABLET BY MOUTH THREE TIMES DAILY (Patient taking differently: Take 1 tablet by mouth 2 (two) times daily.) 270 tablet 1   metoprolol tartrate (LOPRESSOR) 25 MG tablet TAKE 1 TABLET(25 MG) BY MOUTH TWICE DAILY 180 tablet 1   morphine (MS CONTIN) 15 MG 12 hr tablet Take 1 tablet (15 mg total) by mouth 2 (two) times daily as needed for pain. 60 tablet 0   torsemide (DEMADEX) 20 MG tablet Take 1 tablet (20 mg total) by mouth 2 (two) times daily. (Patient taking differently: Take 40 mg by mouth every morning.) 180 tablet 1   Current Facility-Administered Medications  Medication Dose Route Frequency Provider Last Rate Last Admin   cyanocobalamin ((VITAMIN B-12)) injection 1,000 mcg  1,000 mcg Intramuscular Q30 days [name], MD   1,000 mcg at 01/31/21 1507    Review of Systems  Constitutional: positive for fatigue Eyes: negative Ears, nose, mouth, throat, and face: negative Respiratory: positive for cough, dyspnea on exertion, and pleurisy/chest pain Cardiovascular: negative Gastrointestinal: negative Genitourinary:negative Integument/breast: negative Hematologic/lymphatic: negative Musculoskeletal:negative Neurological: positive for headaches Behavioral/Psych: negative Endocrine: negative Allergic/Immunologic: negative  Physical Exam  ZES:PQZRA,  healthy, no distress, well nourished, and well developed SKIN: skin color, texture, turgor are normal, no rashes or significant lesions HEAD: Normocephalic, No masses, lesions, tenderness or abnormalities EYES: normal, PERRLA, Conjunctiva are pink and non-injected EARS: External ears normal, Canals clear OROPHARYNX:no exudate, no erythema, and lips, buccal mucosa, and tongue normal  NECK: supple, no adenopathy, no JVD LYMPH:  no palpable lymphadenopathy, no hepatosplenomegaly BREAST:not examined LUNGS: coarse sounds heard, decreased breath sounds HEART: regular rate & rhythm, no murmurs, and no gallops ABDOMEN:abdomen soft, non-tender, normal bowel sounds, and no masses or organomegaly BACK: Back symmetric, no curvature., No CVA tenderness, Range of motion is normal EXTREMITIES:no joint  deformities, effusion, or inflammation, no edema  NEURO: alert & oriented x 3 with fluent speech, no focal motor/sensory deficits  PERFORMANCE STATUS: ECOG 1  LABORATORY DATA: Lab Results  Component Value Date   WBC 12.1 (H) 05/23/2021   HGB 9.9 (L) 05/23/2021   HCT 32.2 (L) 05/23/2021   MCV 82.6 05/23/2021   PLT 257 05/23/2021      Chemistry      Component Value Date/Time   NA 139 05/16/2021 1442   NA 142 04/11/2016 1007   K 4.3 05/16/2021 1442   K 3.3 (L) 04/11/2016 1007   CL 100 05/16/2021 1442   CO2 32 05/16/2021 1442   CO2 31 (H) 04/11/2016 1007   BUN 29 (H) 05/16/2021 1442   BUN 24.4 04/11/2016 1007   CREATININE 1.17 05/16/2021 1442   CREATININE 1.36 (H) 02/04/2020 1443   CREATININE 1.1 04/11/2016 1007      Component Value Date/Time   CALCIUM 10.0 05/16/2021 1442   CALCIUM 9.8 04/11/2016 1007   ALKPHOS 69 05/16/2021 1442   ALKPHOS 82 04/11/2016 1007   AST 14 05/16/2021 1442   AST 12 04/11/2016 1007   ALT 8 05/16/2021 1442   ALT 12 04/11/2016 1007   BILITOT 0.2 05/16/2021 1442   BILITOT 0.35 04/11/2016 1007       RADIOGRAPHIC STUDIES: DG Chest 1 View  Result Date:  05/04/2021 CLINICAL DATA:  Post thoracentesis EXAM: CHEST  1 VIEW COMPARISON:  05/03/2021 FINDINGS: Decreased left pleural effusion with small moderate left residual. There is also probably a small right pleural effusion. Cardiomegaly with aortic atherosclerosis. Possible small left apical pneumothorax. IMPRESSION: 1. Decreased left pleural effusion post thoracentesis with small moderate residual pleural effusion. Possible small left apical pneumothorax. 2. Cardiomegaly.  Probable small right pleural effusion. Critical Value/emergent results were called by telephone at the time of interpretation on 05/04/2021 at 4:27 pm to provider Dr. Olevia Bowens, Who verbally acknowledged these results. Electronically Signed   By: Donavan Foil M.D.   On: 05/04/2021 16:27   DG Chest 2 View  Result Date: 05/03/2021 CLINICAL DATA:  Cough with decreased breath sounds at the left lung base. Symptoms for 2-3 weeks. EXAM: CHEST - 2 VIEW COMPARISON:  Radiographs 08/12/2020.  CT 05/20/2018. FINDINGS: A previously demonstrated left pleural effusion has enlarged, and there is increasing opacity at the left lung base. The right lung appears clear. There is no significant right-sided pleural effusion or pneumothorax. The heart appears enlarged. Aortic atherosclerosis noted. There are stable degenerative changes in the spine. IMPRESSION: Enlarged moderate-sized left pleural effusion with left lower lobe consolidation or mass. If not previously performed, consider thoracentesis for further evaluation. Follow-up necessary to exclude underlying malignancy. Electronically Signed   By: Richardean Sale M.D.   On: 05/03/2021 16:09   DG CHEST PORT 1 VIEW  Result Date: 05/05/2021 CLINICAL DATA:  80 year old female with history of pneumothorax. EXAM: PORTABLE CHEST 1 VIEW COMPARISON:  Chest x-ray 05/04/2021. FINDINGS: Lung volumes are low. Patchy multifocal ill-defined opacities throughout the left mid to lower lung, and at the right lung base.  Complete obscuration of the left hemidiaphragm. Small right and moderate left pleural effusions. No pneumothorax. Cephalization of the pulmonary vasculature, without frank pulmonary edema. Heart size appears enlarged. Upper mediastinal contours are distorted by patient positioning. Atherosclerotic calcifications in the thoracic aorta. IMPRESSION: 1. Findings are concerning for multilobar bilateral pneumonia (left greater than right). 2. Small right and moderate left pleural effusions. 3. Cardiomegaly. 4. Aortic atherosclerosis. Electronically Signed   By:  Vinnie Langton M.D.   On: 05/05/2021 06:15   US THORACENTESIS ASP PLEURAL SPACE W/IMG GUIDE  Result Date: 05/04/2021 INDICATION: Patient presented to the ED with shortness of breath and found to have a left pleural effusion. Interventional radiology asked to perform a diagnostic and therapeutic thoracentesis. EXAM: ULTRASOUND GUIDED THORACENTESIS MEDICATIONS: 1% lidocaine 10 mL COMPLICATIONS: SIR Level A - No therapy, no consequence. PROCEDURE: An ultrasound guided thoracentesis was thoroughly discussed with the patient and questions answered. The benefits, risks, alternatives and complications were also discussed. The patient understands and wishes to proceed with the procedure. Written consent was obtained. Ultrasound was performed to localize and mark an adequate pocket of fluid in the left chest. The area was then prepped and draped in the normal sterile fashion. 1% Lidocaine was used for local anesthesia. Under ultrasound guidance a 6 Fr Safe-T-Centesis catheter was introduced. Thoracentesis was performed. The catheter was removed and a dressing applied. FINDINGS: A total of approximately 850 mL of clear yellow fluid was removed. Samples were sent to the laboratory as requested by the clinical team. IMPRESSION: Successful ultrasound guided left thoracentesis yielding 850 mL of pleural fluid. Possible small pneumothorax identified on post-procedure chest  x-ray. No intervention needed at this time. Read by: Soyla Dryer, NP Electronically Signed   By: Jacqulynn Cadet M.D.   On: 05/04/2021 16:41    ASSESSMENT: This is a very pleasant 80 years old white female recently diagnosed with stage IV non-small cell lung cancer, adenocarcinoma pending further staging work-up diagnosed in October 2022 and presented with malignant left pleural effusion.   PLAN: I had a lengthy discussion with the patient and her granddaughter about her current condition and treatment options. I personally reviewed her previous imaging studies and recommended for the patient to complete the staging work-up by ordering a PET scan as well as MRI of the brain to identify the primary lesion of her disease as well as any other metastatic disease. I will refer the patient to cardiothoracic surgery for consideration of Pleurx catheter placement for drainage of the recurrent left pleural effusion and also to have enough tissue for molecular studies by foundation 1 with PD-L1 expression. I will also send the blood sample to [facility] for molecular studies today. I will see the patient back for follow-up visit in around 2 weeks for evaluation and more detailed discussion of her treatment options based on the final staging work-up as well as the molecular studies. For pain management she will continue her current treatment with morphine on as-needed basis.  She takes Tylenol more than morphine at this point because of concern about dependence. The patient was advised to call immediately if she has any other concerning symptoms in the interval. The patient voices understanding of current disease status and treatment options and is in agreement with the current care plan.  All questions were answered. The patient knows to call the clinic with any problems, questions or concerns. We can certainly see the patient much sooner if necessary.  Thank you so much for allowing me to participate  in the care of [name]. I will continue to follow up the patient with you and assist in her care.  The total time spent in the appointment was 60 minutes.  Disclaimer: This note was dictated with voice recognition software. Similar sounding words can inadvertently be transcribed and may not be corrected upon review.   Eilleen Kempf May 23, 2021, 11:57 AM

## 2021-05-23 NOTE — Progress Notes (Signed)
Chronic Care Management Pharmacy Note  05/25/2021 Name:  [name] MRN:  [id number] DOB:  07/23/1941  Summary: -Pt was recently diagnosed with lung cancer, PET and MRI brain pending. Pt has been feeling down and stressed since diagnosis  Recommendations/Changes made from today's visit: -Advised pt to keep appts for testing and specialists; contact PCP if needed   Subjective: [name] is an 80 y.o. year old female who is a primary patient of [name], MD.  The CCM team was consulted for assistance with disease management and care coordination needs.    Engaged with patient by telephone for follow up visit in response to provider referral for pharmacy case management and/or care coordination services.   Consent to Services:  The patient was given information about Chronic Care Management services, agreed to services, and gave verbal consent prior to initiation of services.  Please see initial visit note for detailed documentation.   Patient Care Team: [name], MD as PCP - General Irish Lack Charlann Lange, MD as PCP - Cardiology (Cardiology) Thompson Grayer, MD as PCP - Electrophysiology (Cardiology) Charlton Haws, [facility] as Pharmacist (Pharmacist) Valrie Hart, RN as Oncology Nurse Navigator (Oncology)  Recent office visits: 05/16/21 Dr Jenny Reichmann OV: hospital f/u; family notified of metastatic adenocarcinoma dx. Rx'd Ms Contin 15 mg BID. Referred to oncology  05/03/21 Dr Ronnald Ramp OV: referred to ED for thoracentesis.  09/28/20 Dr Ronnald Ramp OV: hospital f/u; s/p admission for abdominal pain and COVID. DC'd livalo (pt not taking). Labs stable  Recent consult visits: 05/23/21 Dr Julien Nordmann (oncology): new dx metastatic adenocarcinoma; ordered PET, MRI, referred to CT surgery. RTC 2 weeks  Hospital visits: Medication Reconciliation was completed by comparing discharge summary, patient's EMR and Pharmacy list, and upon discussion with patient.  Admitted to the hospital  on 05/03/21 due to pleural effusion/thoracentesis. Discharge date was 05/09/21. Discharged from [facility].    Medications that remain the same after Hospital Discharge:??  -All other medications will remain the same.    08/09/20-08/19/20 admission for ventral hernia. Operated 1/14 with mass resection, colectomy/colostomy. COVID positive inpatient. Started Augmentin for UTI and Probiotic at discharge.  Admitted to the hospital on 08/09/20 due to ventral hernia. Discharge date was 08/19/20. Discharged from [place]?Medications Started at [facility] Discharge:?? -started Augmentin and Florastor due to UTI  Medications that remain the same after Hospital Discharge:??  -All other medications will remain the same.    Objective:  Lab Results  Component Value Date   CREATININE 1.28 (H) 05/23/2021   BUN 25 (H) 05/23/2021   GFR 43.97 (L) 05/16/2021   GFRNONAA 42 (L) 05/23/2021   GFRAA 30 (L) 02/19/2020   NA 139 05/23/2021   K 4.5 05/23/2021   CALCIUM 9.7 05/23/2021   CO2 31 05/23/2021   GLUCOSE 111 (H) 05/23/2021    Lab Results  Component Value Date/Time   HGBA1C 5.4 11/05/2017 11:03 AM   HGBA1C 5.7 08/19/2017 03:01 PM   GFR 43.97 (L) 05/16/2021 02:42 PM   GFR 37.53 (L) 09/28/2020 03:50 PM   MICROALBUR <0.7 11/05/2017 11:03 AM   MICROALBUR 15.9 (H) 02/05/2013 01:36 PM    Last diabetic Eye exam:  Lab Results  Component Value Date/Time   HMDIABEYEEXA No Retinopathy 09/23/2018 12:00 AM    Last diabetic Foot exam:  Lab Results  Component Value Date/Time   HMDIABFOOTEX done 02/19/2013 12:00 AM     Lab Results  Component Value Date   CHOL  165 02/04/2020   HDL 49 (L) 02/04/2020   LDLCALC 90 02/04/2020   LDLDIRECT 107.0 09/04/2016   TRIG 162 (H) 02/04/2020   CHOLHDL 3.4 02/04/2020    Hepatic Function Latest Ref Rng & Units 05/23/2021 05/16/2021 05/04/2021  Total Protein 6.5 - 8.1 g/dL 7.2 7.5 8.0  Albumin 3.5 - 5.0 g/dL 3.1(L) 3.6 3.7  AST 15 - 41  U/L 13(L) 14 16  ALT 0 - 44 U/L <5 8 10   Alk Phosphatase 38 - 126 U/L 67 69 59  Total Bilirubin 0.3 - 1.2 mg/dL 0.3 0.2 0.5  Bilirubin, Direct 0.0 - 0.3 mg/dL - 0.0 -    Lab Results  Component Value Date/Time   TSH 2.35 07/02/2019 11:31 AM   TSH 3.276 10/17/2017 08:46 PM   TSH 3.22 08/08/2017 04:22 PM   FREET4 1.13 (H) 10/17/2017 08:46 PM    CBC Latest Ref Rng & Units 05/23/2021 05/16/2021 05/08/2021  WBC 4.0 - 10.5 K/uL 12.1(H) 12.4(H) 10.0  Hemoglobin 12.0 - 15.0 g/dL 9.9(L) 10.6(L) 9.7(L)  Hematocrit 36.0 - 46.0 % 32.2(L) 32.7(L) 30.8(L)  Platelets 150 - 400 K/uL 257 325.0 197    Lab Results  Component Value Date/Time   VD25OH 35.09 07/02/2019 11:31 AM   VD25OH 93.56 12/29/2018 01:46 PM   Clinical ASCVD: No  The ASCVD Risk score (Arnett DK, et al., 2019) failed to calculate for the following reasons:   The 2019 ASCVD risk score is only valid for ages 39 to 7    Depression screen PHQ 2/9 02/04/2020 01/01/2018 07/18/2017  Decreased Interest 0 0 0  Down, Depressed, Hopeless 0 0 0  PHQ - 2 Score 0 0 0  Some recent data might be hidden     CHA2DS2-VASc Score = 5  The patient's score is based upon: CHF History: 1 HTN History: 1 Diabetes History: 0 Stroke History: 0 Vascular Disease History: 0 Age Score: 2 Gender Score: 1      Social History   Tobacco Use  Smoking Status Former   Types: Cigarettes   Quit date: 06/01/1996   Years since quitting: 24.9  Smokeless Tobacco Never  Tobacco Comments   Quit 1998   BP Readings from Last 3 Encounters:  05/23/21 (!) 139/42  05/16/21 (!) 148/64  05/09/21 (!) 168/50   Pulse Readings from Last 3 Encounters:  05/23/21 62  05/16/21 67  05/09/21 69   Wt Readings from Last 3 Encounters:  05/23/21 180 lb 6.4 oz (81.8 kg)  05/16/21 181 lb (82.1 kg)  05/05/21 179 lb 3.2 oz (81.3 kg)   BMI Readings from Last 3 Encounters:  05/23/21 35.23 kg/m  05/16/21 35.35 kg/m  05/05/21 35.00 kg/m   Assessment/Interventions:  Review of patient past medical history, allergies, medications, health status, including review of consultants reports, laboratory and other test data, was performed as part of comprehensive evaluation and provision of chronic care management services.   SDOH:  (Social Determinants of Health) assessments and interventions performed: Yes  SDOH Screenings   Alcohol Screen: Not on file  Depression (VZD6-3): Not on file  Financial Resource Strain: High Risk   Difficulty of Paying Living Expenses: Hard  Food Insecurity: Not on file  Housing: Not on file  Physical Activity: Not on file  Social Connections: Not on file  Stress: Not on file  Tobacco Use: Medium Risk   Smoking Tobacco Use: Former   Smokeless Tobacco Use: Never   Passive Exposure: Not on file  Transportation Needs: Not on file  CCM Care Plan  Allergies  Allergen Reactions   Celebrex [Celecoxib]     edema   Amlodipine Besylate Other (See Comments)    REACTION: edema   Enalapril Cough   Lipitor [Atorvastatin]     Muscle aches   Amoxicillin Diarrhea    DID THE REACTION INVOLVE: Swelling of the face/tongue/throat, SOB, or low BP? N Sudden or severe rash/hives, skin peeling, or the inside of the mouth or nose? N Did it require medical treatment? N When did it last happen?      MORE THAN 10 YEARS If all above answers are "NO", may proceed with cephalosporin use.    Codeine Sulfate Nausea Only   Hydrocodone-Acetaminophen Nausea Only    Medications Reviewed Today     Reviewed by Charlton Haws, [facility] (Pharmacist) on 05/25/21 at [place] List Status: <None>   Medication Order Taking? Sig Documenting Provider Last Dose Status Informant  acetaminophen (TYLENOL) 325 MG tablet [id number] Yes Take 325-650 mg by mouth daily as needed for moderate pain, fever or headache.  [provider] Taking Active Self  albuterol (PROAIR HFA) 108 (90 Base) MCG/ACT inhaler [id number] Yes Inhale 1-2 puffs into the lungs every  6 (six) hours as needed for wheezing or shortness of breath. [name], MD Taking Active Self  allopurinol (ZYLOPRIM) 100 MG tablet [id number] Yes TAKE 1 TABLET(100 MG) BY MOUTH DAILY  Patient taking differently: Take 100 mg by mouth daily.   [name], MD Taking Active   cyanocobalamin ((VITAMIN B-12)) injection 1,000 mcg [id number]   [name], MD  Active   ferrous sulfate 325 (65 FE) MG tablet [id number] Yes Take 1 tablet (325 mg total) by mouth 2 (two) times daily with a meal. [name], MD Taking Active Self  Fluticasone-Umeclidin-Vilant (TRELEGY ELLIPTA) 100-62.5-25 MCG/INH AEPB [id number] Yes Inhale 1 puff into the lungs daily. [name], MD Taking Active Self  guaiFENesin-dextromethorphan [facility] DM) 100-10 MG/5ML syrup [id number] Yes Take 5 mLs by mouth every 4 (four) hours as needed for cough (chest congestion). Pokhrel, Laxman, MD Taking Active   isosorbide-hydrALAZINE (BIDIL) 20-37.5 MG tablet [id number] Yes TAKE 1 TABLET BY MOUTH THREE TIMES DAILY  Patient taking differently: Take 1 tablet by mouth 2 (two) times daily.   [name], MD Taking Active Self  metoprolol tartrate (LOPRESSOR) 25 MG tablet [id number] Yes TAKE 1 TABLET(25 MG) BY MOUTH TWICE DAILY [name], MD Taking Active   morphine (MS CONTIN) 15 MG 12 hr tablet [id number] Yes Take 1 tablet (15 mg total) by mouth 2 (two) times daily as needed for pain. Biagio Borg, MD Taking Active   torsemide (DEMADEX) 20 MG tablet [id number] Yes TAKE 2 TABLETS(40 MG) BY MOUTH EVERY MORNING [name], MD Taking Active             Patient Active Problem List   Diagnosis Date Noted   Metastatic adenocarcinoma ([place]) 05/21/2021   Pleural effusion 05/05/2021   Pleural effusion on left 05/04/2021   Normocytic anemia 05/04/2021   Subacute cough 05/03/2021   Pleural effusion, left 05/03/2021   Dysuria 09/28/2020   Irreducible ventral hernia 08/09/2020   COPD (chronic obstructive pulmonary  disease) ([place]) 08/09/2020   Chronic respiratory failure with hypoxia ([place]) 02/06/2020   Routine general medical examination at a health care facility 02/06/2020   AVM (arteriovenous malformation) of small bowel, acquired with hemorrhage 08/14/2018   Multiple lung nodules on CT 05/05/2018   Stenosis of cervical  spine with myelopathy ([place]) 01/01/2018   Atrial fibrillation ([place]) 10/17/2017   CHF (congestive heart failure), NYHA class III, chronic, diastolic ([place]) 16/04/9603   Angiodysplasia of cecum    Angiodysplasia of duodenum    Grade III diastolic dysfunction 54/03/8118   MGUS (monoclonal gammopathy of unknown significance) 11/08/2016   Asteatotic eczema 02/25/2016   COLD (chronic obstructive lung disease) (HCC)    Anemia, iron deficiency 11/16/2014   Cor pulmonale ([place]) 11/16/2014   OSA (obstructive sleep apnea) 06/22/2013   Chronic venous stasis dermatitis of both lower extremities 06/02/2013   Vitamin D deficiency 02/06/2013   Osteopenia, senile 02/05/2013   Other screening mammogram 02/05/2013   Chronic kidney disease, stage 3b ([place]) 06/20/2010   Morbid obesity ([place]) 04/19/2010   Gout 01/10/2009   ULCERATIVE COLITIS-LEFT SIDE 03/19/2008   Vitamin B12 deficiency anemia 11/13/2006   Hyperlipidemia with target LDL less than 100 11/27/1996   GERD 07/30/1992   HTN (hypertension) 07/30/1988    Immunization History  Administered Date(s) Administered   Fluad Quad(high Dose 65+) 05/23/2020   Influenza Split 04/13/2014, 06/05/2018   Influenza Whole 05/30/2000, 05/12/2009, 04/29/2010   Influenza, High Dose Seasonal PF 03/29/2016, 04/29/2017, 05/19/2019   Influenza,inj,Quad PF,6+ Mos 06/02/2013, 04/22/2014, 04/18/2015   PFIZER(Purple Top)SARS-COV-2 Vaccination 12/19/2019, 01/09/2020   Pneumococcal Conjugate-13 02/05/2013   Pneumococcal Polysaccharide-23 11/07/2006, 10/19/2017   Td 10/30/2001   Tdap 09/25/2011    Conditions to be addressed/monitored:  Hypertension,  Hyperlipidemia, Atrial Fibrillation, Heart Failure, COPD and Chronic Kidney Disease  Care Plan : [place]  Updates made by Charlton Haws, [place] since 05/25/2021 12:00 AM     Problem: Hypertension, Hyperlipidemia, Atrial Fibrillation, Heart Failure, COPD and Chronic Kidney Disease   Priority: High     Long-Range Goal: Disease management   Start Date: 11/22/2020  Expected End Date: 11/22/2021  Recent Progress: On track  Priority: High  Note:   Current Barriers:  Unable to independently monitor therapeutic efficacy  Pharmacist Clinical Goal(s):  Patient will achieve adherence to monitoring guidelines and medication adherence to achieve therapeutic efficacy through collaboration with PharmD and provider.   Interventions: 1:1 collaboration with [name], MD regarding development and update of comprehensive plan of care as evidenced by provider attestation and co-signature Inter-disciplinary care team collaboration (see longitudinal plan of care) Comprehensive medication review performed; medication list updated in electronic medical record  Heart Failure / Hypertension / CKD 3b    Type: Diastolic Last ejection fraction: 60-65% (05/11/2020) NYHA Class: III (marked limitation of activity)   BP goal is:  <140/90 Patient checks BP at home 1-2x per week   Patient home BP readings are ranging: not checking; wrist cuff inaccurate    Patient has failed these meds in past: n/a Patient is currently controlled on the following medications:  Metoprolol tartrate 25 mg - 1/2 tab BID Torsemide 20 mg - 2 in morning Isosorbide-hydralazine 20-37.5 mg BID   We discussed: p reports compliance with medications; denies issues   Plan: Recommend to continue current medication   AFIB    Hx recurrent GI bleeds - no anticoagulation Patient is currently rate controlled.   Patient has failed these meds in past: n/a Patient is currently controlled on the following  medications:  Metoprolol tartrate 25 mg 1/2 tab BID   We discussed: pt denies issues with Afib recently; she reports HR is well controlled on current dose of metoprolol (previously she had bradycardia with 25 mg BID); she is also not a candidate for anticoagulation  due to hx of recurrent GI bleeding    Plan: Continue current medications   Hyperlipidemia    LDL goal < 100  Patient has failed these meds in past: atorvastatin (cramps), pitavastatin (cost) Patient is currently uncontrolled on the following medications:  No medication   We discussed:  pt is >73 years old and LDL is 90, reasonable to continue without medication   Plan: Continue control with diet/exercise   COPD    Last spirometry score: not on file Patient has failed these meds in past: n/a Patient is currently controlled on the following medications:  Trelegy 1 puff daily Albuterol nebulizer Albuterol HFA prn 2 L O2   Using maintenance inhaler regularly? Yes Frequency of rescue inhaler use:  infrequently   We discussed:  pt reports Trelegy is not bothering her throat like it used to; she is fine to continue with Ellipta device   Plan: Continue current medications   Health maintenance   -Vaccine gaps: Shingrix, Covid booster -Pt was recently diagnosed with lung cancer; she has been scheduled for PET and MRI brain, treatment strategy pending results   Patient Goals/Self-Care Activities Patient will: - take medications as prescribed -focus on medication adherence by pill box       Compliance/Adherence/Medication fill history: Care Gaps: A1c (due 05/07/18) Foot exam (11/06/18) Urine microalbumin (11/06/18) Eye exam (09/24/19)  Star-Rating Drugs: None  Medication Assistance: None required.  Patient affirms current coverage meets needs.  Patient's preferred pharmacy is:  [id number] [place], [place] - [address] AT [address] [place]  [address] Phone: [phone] Fax: [place], [address] [place] [address] Phone: [phone] Fax: [phone]  [place], [place] [address] Phone: [phone] Fax: [phone]  Uses pill box? Yes Pt endorses 100% compliance  We discussed: Current pharmacy is preferred with insurance plan and patient is satisfied with pharmacy services Patient decided to: Continue current medication management strategy  Care Plan and Follow Up Patient Decision:  Patient agrees to Care Plan and Follow-up.  Plan: Telephone follow up appointment with care management team member scheduled for:  6 months  Charlene Brooke, PharmD, [place], CPP Clinical Pharmacist [facility] at [facility] [phone]

## 2021-05-23 NOTE — Progress Notes (Signed)
Per pathology, I received an update that moleculars and PDL 1 on recent cytology will go to [facility] tomorrow.

## 2021-05-24 ENCOUNTER — Encounter: Payer: Self-pay | Admitting: *Deleted

## 2021-05-24 ENCOUNTER — Telehealth: Payer: Self-pay | Admitting: Internal Medicine

## 2021-05-24 ENCOUNTER — Other Ambulatory Visit: Payer: Self-pay | Admitting: Internal Medicine

## 2021-05-24 DIAGNOSIS — I878 Other specified disorders of veins: Secondary | ICD-10-CM | POA: Diagnosis not present

## 2021-05-24 DIAGNOSIS — M4802 Spinal stenosis, cervical region: Secondary | ICD-10-CM | POA: Diagnosis not present

## 2021-05-24 DIAGNOSIS — J9621 Acute and chronic respiratory failure with hypoxia: Secondary | ICD-10-CM | POA: Diagnosis not present

## 2021-05-24 DIAGNOSIS — K219 Gastro-esophageal reflux disease without esophagitis: Secondary | ICD-10-CM | POA: Diagnosis not present

## 2021-05-24 DIAGNOSIS — M479 Spondylosis, unspecified: Secondary | ICD-10-CM | POA: Diagnosis not present

## 2021-05-24 DIAGNOSIS — I5042 Chronic combined systolic (congestive) and diastolic (congestive) heart failure: Secondary | ICD-10-CM

## 2021-05-24 DIAGNOSIS — J918 Pleural effusion in other conditions classified elsewhere: Secondary | ICD-10-CM | POA: Diagnosis not present

## 2021-05-24 DIAGNOSIS — G4733 Obstructive sleep apnea (adult) (pediatric): Secondary | ICD-10-CM | POA: Diagnosis not present

## 2021-05-24 DIAGNOSIS — D472 Monoclonal gammopathy: Secondary | ICD-10-CM | POA: Diagnosis not present

## 2021-05-24 DIAGNOSIS — D631 Anemia in chronic kidney disease: Secondary | ICD-10-CM | POA: Diagnosis not present

## 2021-05-24 DIAGNOSIS — I700 Atherosclerosis of aorta: Secondary | ICD-10-CM | POA: Diagnosis not present

## 2021-05-24 DIAGNOSIS — I2781 Cor pulmonale (chronic): Secondary | ICD-10-CM | POA: Diagnosis not present

## 2021-05-24 DIAGNOSIS — M19072 Primary osteoarthritis, left ankle and foot: Secondary | ICD-10-CM | POA: Diagnosis not present

## 2021-05-24 DIAGNOSIS — L03119 Cellulitis of unspecified part of limb: Secondary | ICD-10-CM | POA: Diagnosis not present

## 2021-05-24 DIAGNOSIS — J449 Chronic obstructive pulmonary disease, unspecified: Secondary | ICD-10-CM | POA: Diagnosis not present

## 2021-05-24 DIAGNOSIS — I130 Hypertensive heart and chronic kidney disease with heart failure and stage 1 through stage 4 chronic kidney disease, or unspecified chronic kidney disease: Secondary | ICD-10-CM | POA: Diagnosis not present

## 2021-05-24 DIAGNOSIS — D519 Vitamin B12 deficiency anemia, unspecified: Secondary | ICD-10-CM | POA: Diagnosis not present

## 2021-05-24 DIAGNOSIS — I10 Essential (primary) hypertension: Secondary | ICD-10-CM

## 2021-05-24 DIAGNOSIS — M85832 Other specified disorders of bone density and structure, left forearm: Secondary | ICD-10-CM | POA: Diagnosis not present

## 2021-05-24 DIAGNOSIS — H539 Unspecified visual disturbance: Secondary | ICD-10-CM | POA: Diagnosis not present

## 2021-05-24 DIAGNOSIS — E785 Hyperlipidemia, unspecified: Secondary | ICD-10-CM | POA: Diagnosis not present

## 2021-05-24 DIAGNOSIS — M810 Age-related osteoporosis without current pathological fracture: Secondary | ICD-10-CM | POA: Diagnosis not present

## 2021-05-24 DIAGNOSIS — D630 Anemia in neoplastic disease: Secondary | ICD-10-CM | POA: Diagnosis not present

## 2021-05-24 DIAGNOSIS — N1832 Chronic kidney disease, stage 3b: Secondary | ICD-10-CM | POA: Diagnosis not present

## 2021-05-24 DIAGNOSIS — M1030 Gout due to renal impairment, unspecified site: Secondary | ICD-10-CM | POA: Diagnosis not present

## 2021-05-24 DIAGNOSIS — U099 Post covid-19 condition, unspecified: Secondary | ICD-10-CM | POA: Diagnosis not present

## 2021-05-24 DIAGNOSIS — M19071 Primary osteoarthritis, right ankle and foot: Secondary | ICD-10-CM | POA: Diagnosis not present

## 2021-05-24 NOTE — Telephone Encounter (Signed)
Pt scheduled for tomorrow 10/27 @ 2.20pm

## 2021-05-24 NOTE — Progress Notes (Signed)
I checked to see if Ms. Dethloff scans are authorized with insurance so they can get scheduled. I was unable to understand if authorization is completed. I reached out to HIM team for an update. Wait for response.

## 2021-05-24 NOTE — Telephone Encounter (Signed)
Clair Gulling from [facility] has called and states that pt. States she has been feeling poor. Upon arrival BP was 180/80, then 180/90 after 10 min of speaking. She is having 10/10 lower back pain, and report is usually mild. About 20-30 min prior to call, Clair Gulling states pt. Took Tylenol which does not seem to be helping as of yet. Pt. Does have morphine, but is adamant about using it. Reports pain when urinating that is constant. Poor appetite and sleep were also reported due to pain and worry. Would like a call back with recommendations ASAP.   Callback #- [phone] Clair Gulling)  [phone] (patient)

## 2021-05-24 NOTE — Telephone Encounter (Signed)
Verbal order given to M[place].   Would you like pt to be seen for symptoms that was listed in previous message?

## 2021-05-24 NOTE — Telephone Encounter (Signed)
[name] has called back and is requesting a verbal to have an evaluation for a home health nurse.   Please advise.

## 2021-05-25 ENCOUNTER — Other Ambulatory Visit: Payer: Self-pay | Admitting: Internal Medicine

## 2021-05-25 ENCOUNTER — Ambulatory Visit (INDEPENDENT_AMBULATORY_CARE_PROVIDER_SITE_OTHER): Payer: Medicare Other | Admitting: Internal Medicine

## 2021-05-25 ENCOUNTER — Encounter: Payer: Self-pay | Admitting: Internal Medicine

## 2021-05-25 ENCOUNTER — Other Ambulatory Visit: Payer: Self-pay

## 2021-05-25 VITALS — BP 146/90 | HR 68 | Temp 98.3°F | Ht 60.0 in

## 2021-05-25 DIAGNOSIS — D519 Vitamin B12 deficiency anemia, unspecified: Secondary | ICD-10-CM | POA: Diagnosis not present

## 2021-05-25 DIAGNOSIS — I480 Paroxysmal atrial fibrillation: Secondary | ICD-10-CM | POA: Diagnosis not present

## 2021-05-25 DIAGNOSIS — D630 Anemia in neoplastic disease: Secondary | ICD-10-CM | POA: Diagnosis not present

## 2021-05-25 DIAGNOSIS — J9621 Acute and chronic respiratory failure with hypoxia: Secondary | ICD-10-CM | POA: Diagnosis not present

## 2021-05-25 DIAGNOSIS — I700 Atherosclerosis of aorta: Secondary | ICD-10-CM | POA: Diagnosis not present

## 2021-05-25 DIAGNOSIS — N1832 Chronic kidney disease, stage 3b: Secondary | ICD-10-CM | POA: Diagnosis not present

## 2021-05-25 DIAGNOSIS — I5042 Chronic combined systolic (congestive) and diastolic (congestive) heart failure: Secondary | ICD-10-CM

## 2021-05-25 DIAGNOSIS — E785 Hyperlipidemia, unspecified: Secondary | ICD-10-CM | POA: Diagnosis not present

## 2021-05-25 DIAGNOSIS — M19071 Primary osteoarthritis, right ankle and foot: Secondary | ICD-10-CM | POA: Diagnosis not present

## 2021-05-25 DIAGNOSIS — M1030 Gout due to renal impairment, unspecified site: Secondary | ICD-10-CM | POA: Diagnosis not present

## 2021-05-25 DIAGNOSIS — I2781 Cor pulmonale (chronic): Secondary | ICD-10-CM | POA: Diagnosis not present

## 2021-05-25 DIAGNOSIS — C799 Secondary malignant neoplasm of unspecified site: Secondary | ICD-10-CM

## 2021-05-25 DIAGNOSIS — M810 Age-related osteoporosis without current pathological fracture: Secondary | ICD-10-CM | POA: Diagnosis not present

## 2021-05-25 DIAGNOSIS — M479 Spondylosis, unspecified: Secondary | ICD-10-CM | POA: Diagnosis not present

## 2021-05-25 DIAGNOSIS — G4733 Obstructive sleep apnea (adult) (pediatric): Secondary | ICD-10-CM | POA: Diagnosis not present

## 2021-05-25 DIAGNOSIS — I878 Other specified disorders of veins: Secondary | ICD-10-CM | POA: Diagnosis not present

## 2021-05-25 DIAGNOSIS — D631 Anemia in chronic kidney disease: Secondary | ICD-10-CM | POA: Diagnosis not present

## 2021-05-25 DIAGNOSIS — D472 Monoclonal gammopathy: Secondary | ICD-10-CM | POA: Diagnosis not present

## 2021-05-25 DIAGNOSIS — M19072 Primary osteoarthritis, left ankle and foot: Secondary | ICD-10-CM | POA: Diagnosis not present

## 2021-05-25 DIAGNOSIS — U099 Post covid-19 condition, unspecified: Secondary | ICD-10-CM | POA: Diagnosis not present

## 2021-05-25 DIAGNOSIS — K219 Gastro-esophageal reflux disease without esophagitis: Secondary | ICD-10-CM | POA: Diagnosis not present

## 2021-05-25 DIAGNOSIS — I5032 Chronic diastolic (congestive) heart failure: Secondary | ICD-10-CM

## 2021-05-25 DIAGNOSIS — H539 Unspecified visual disturbance: Secondary | ICD-10-CM | POA: Diagnosis not present

## 2021-05-25 DIAGNOSIS — M85832 Other specified disorders of bone density and structure, left forearm: Secondary | ICD-10-CM | POA: Diagnosis not present

## 2021-05-25 DIAGNOSIS — I130 Hypertensive heart and chronic kidney disease with heart failure and stage 1 through stage 4 chronic kidney disease, or unspecified chronic kidney disease: Secondary | ICD-10-CM | POA: Diagnosis not present

## 2021-05-25 DIAGNOSIS — J918 Pleural effusion in other conditions classified elsewhere: Secondary | ICD-10-CM | POA: Diagnosis not present

## 2021-05-25 DIAGNOSIS — J449 Chronic obstructive pulmonary disease, unspecified: Secondary | ICD-10-CM | POA: Diagnosis not present

## 2021-05-25 DIAGNOSIS — M4802 Spinal stenosis, cervical region: Secondary | ICD-10-CM | POA: Diagnosis not present

## 2021-05-25 NOTE — Progress Notes (Signed)
Subjective:  Patient ID: [name], female    DOB: [birth year]  Age: 80 y.o. MRN: [id number]  CC: Follow-up  This visit occurred during the SARS-CoV-2 public health emergency.  Safety protocols were in place, including screening questions prior to the visit, additional usage of staff PPE, and extensive cleaning of exam room while observing appropriate contact time as indicated for disinfecting solutions.    HPI [name] presents for f/up -   She has stage 4 lung adenocarcinoma. She does not want to take an antidepressant. She is controlling her pain with morphine.  Outpatient Medications Prior to Visit  Medication Sig Dispense Refill   acetaminophen (TYLENOL) 325 MG tablet Take 325-650 mg by mouth daily as needed for moderate pain, fever or headache.      albuterol (PROAIR HFA) 108 (90 Base) MCG/ACT inhaler Inhale 1-2 puffs into the lungs every 6 (six) hours as needed for wheezing or shortness of breath. 18 g 11   allopurinol (ZYLOPRIM) 100 MG tablet TAKE 1 TABLET(100 MG) BY MOUTH DAILY (Patient taking differently: Take 100 mg by mouth daily.) 90 tablet 1   ferrous sulfate 325 (65 FE) MG tablet Take 1 tablet (325 mg total) by mouth 2 (two) times daily with a meal. 180 tablet 1   Fluticasone-Umeclidin-Vilant (TRELEGY ELLIPTA) 100-62.5-25 MCG/INH AEPB Inhale 1 puff into the lungs daily. 120 each 1   guaiFENesin-dextromethorphan (ROBITUSSIN DM) 100-10 MG/5ML syrup Take 5 mLs by mouth every 4 (four) hours as needed for cough (chest congestion). 118 mL 0   isosorbide-hydrALAZINE (BIDIL) 20-37.5 MG tablet TAKE 1 TABLET BY MOUTH THREE TIMES DAILY 270 tablet 0   metoprolol tartrate (LOPRESSOR) 25 MG tablet TAKE 1 TABLET(25 MG) BY MOUTH TWICE DAILY (Patient taking differently: Take 12.5 mg by mouth 2 (two) times daily.) 180 tablet 1   morphine (MS CONTIN) 15 MG 12 hr tablet Take 1 tablet (15 mg total) by mouth 2 (two) times daily as needed for pain. 60 tablet 0   torsemide (DEMADEX) 20  MG tablet TAKE 2 TABLETS(40 MG) BY MOUTH EVERY MORNING 180 tablet 0   Facility-Administered Medications Prior to Visit  Medication Dose Route Frequency Provider Last Rate Last Admin   cyanocobalamin ((VITAMIN B-12)) injection 1,000 mcg  1,000 mcg Intramuscular Q30 days [name], MD   1,000 mcg at 01/31/21 1507    ROS Review of Systems  Constitutional:  Positive for unexpected weight change.  HENT: Negative.    Respiratory:  Positive for shortness of breath. Negative for cough and wheezing.   Cardiovascular:  Negative for chest pain, palpitations and leg swelling.  Gastrointestinal:  Positive for abdominal pain and nausea. Negative for constipation, diarrhea and vomiting.  Endocrine: Negative for polyuria.  Genitourinary:  Positive for flank pain and frequency. Negative for dysuria and hematuria.  Skin: Negative.   Neurological:  Positive for headaches. Negative for dizziness and speech difficulty.  Hematological:  Negative for adenopathy. Does not bruise/bleed easily.  Psychiatric/Behavioral: Negative.     Objective:  BP (!) 146/90 (BP Location: Right Arm, Patient Position: Sitting, Cuff Size: Large)   Pulse 68   Temp 98.3 F (36.8 C) (Oral)   Ht 5' (1.524 m)   SpO2 95%   BMI 35.23 kg/m   BP Readings from Last 3 Encounters:  05/25/21 (!) 146/90  05/23/21 (!) 139/42  05/16/21 (!) 148/64    Wt Readings from Last 3 Encounters:  05/23/21 180 lb 6.4 oz (81.8 kg)  05/16/21 181 lb (82.1 kg)  05/05/21 179 lb 3.2 oz (81.3 kg)    Physical Exam Vitals reviewed.  HENT:     Mouth/Throat:     Mouth: Mucous membranes are moist.  Eyes:     Conjunctiva/sclera: Conjunctivae normal.  Cardiovascular:     Rate and Rhythm: Normal rate and regular rhythm.     Heart sounds: Murmur heard.  Pulmonary:     Effort: Pulmonary effort is normal.     Breath sounds: Examination of the left-lower field reveals decreased breath sounds. Decreased breath sounds present. No wheezing, rhonchi  or rales.  Abdominal:     General: Abdomen is protuberant. Bowel sounds are normal. There is no distension.     Palpations: Abdomen is soft. There is no hepatomegaly, splenomegaly or mass.     Tenderness: There is no abdominal tenderness.  Musculoskeletal:        General: Normal range of motion.     Cervical back: Neck supple.     Right lower leg: No edema.     Left lower leg: No edema.  Lymphadenopathy:     Cervical: No cervical adenopathy.  Skin:    General: Skin is warm and dry.     Findings: No rash.  Neurological:     General: No focal deficit present.     Mental Status: She is alert.  Psychiatric:        Mood and Affect: Mood normal.    Lab Results  Component Value Date   WBC 12.1 (H) 05/23/2021   HGB 9.9 (L) 05/23/2021   HCT 32.2 (L) 05/23/2021   PLT 257 05/23/2021   GLUCOSE 111 (H) 05/23/2021   CHOL 165 02/04/2020   TRIG 162 (H) 02/04/2020   HDL 49 (L) 02/04/2020   LDLDIRECT 107.0 09/04/2016   LDLCALC 90 02/04/2020   ALT <5 05/23/2021   AST 13 (L) 05/23/2021   NA 139 05/23/2021   K 4.5 05/23/2021   CL 99 05/23/2021   CREATININE 1.28 (H) 05/23/2021   BUN 25 (H) 05/23/2021   CO2 31 05/23/2021   TSH 2.35 07/02/2019   INR 1.1 05/04/2021   HGBA1C 5.4 11/05/2017   MICROALBUR <0.7 11/05/2017    DG Chest 1 View  Result Date: 05/04/2021 CLINICAL DATA:  Post thoracentesis EXAM: CHEST  1 VIEW COMPARISON:  05/03/2021 FINDINGS: Decreased left pleural effusion with small moderate left residual. There is also probably a small right pleural effusion. Cardiomegaly with aortic atherosclerosis. Possible small left apical pneumothorax. IMPRESSION: 1. Decreased left pleural effusion post thoracentesis with small moderate residual pleural effusion. Possible small left apical pneumothorax. 2. Cardiomegaly.  Probable small right pleural effusion. Critical Value/emergent results were called by telephone at the time of interpretation on 05/04/2021 at 4:27 pm to provider Dr. Olevia Bowens, Who  verbally acknowledged these results. Electronically Signed   By: Donavan Foil M.D.   On: 05/04/2021 16:27   DG CHEST PORT 1 VIEW  Result Date: 05/05/2021 CLINICAL DATA:  80 year old female with history of pneumothorax. EXAM: PORTABLE CHEST 1 VIEW COMPARISON:  Chest x-ray 05/04/2021. FINDINGS: Lung volumes are low. Patchy multifocal ill-defined opacities throughout the left mid to lower lung, and at the right lung base. Complete obscuration of the left hemidiaphragm. Small right and moderate left pleural effusions. No pneumothorax. Cephalization of the pulmonary vasculature, without frank pulmonary edema. Heart size appears enlarged. Upper mediastinal contours are distorted by patient positioning. Atherosclerotic calcifications in the thoracic aorta. IMPRESSION: 1. Findings are concerning for multilobar bilateral pneumonia (left greater than right). 2. Small right  and moderate left pleural effusions. 3. Cardiomegaly. 4. Aortic atherosclerosis. Electronically Signed   By: Vinnie Langton M.D.   On: 05/05/2021 06:15   US THORACENTESIS ASP PLEURAL SPACE W/IMG GUIDE  Result Date: 05/04/2021 INDICATION: Patient presented to the ED with shortness of breath and found to have a left pleural effusion. Interventional radiology asked to perform a diagnostic and therapeutic thoracentesis. EXAM: ULTRASOUND GUIDED THORACENTESIS MEDICATIONS: 1% lidocaine 10 mL COMPLICATIONS: SIR Level A - No therapy, no consequence. PROCEDURE: An ultrasound guided thoracentesis was thoroughly discussed with the patient and questions answered. The benefits, risks, alternatives and complications were also discussed. The patient understands and wishes to proceed with the procedure. Written consent was obtained. Ultrasound was performed to localize and mark an adequate pocket of fluid in the left chest. The area was then prepped and draped in the normal sterile fashion. 1% Lidocaine was used for local anesthesia. Under ultrasound guidance a 6  Fr Safe-T-Centesis catheter was introduced. Thoracentesis was performed. The catheter was removed and a dressing applied. FINDINGS: A total of approximately 850 mL of clear yellow fluid was removed. Samples were sent to the laboratory as requested by the clinical team. IMPRESSION: Successful ultrasound guided left thoracentesis yielding 850 mL of pleural fluid. Possible small pneumothorax identified on post-procedure chest x-ray. No intervention needed at this time. Read by: Soyla Dryer, NP Electronically Signed   By: Jacqulynn Cadet M.D.   On: 05/04/2021 16:41    Assessment & Plan:   Frayda was seen today for follow-up.  Diagnoses and all orders for this visit:  Metastatic adenocarcinoma ([place])- Will control her pain with morphine.  CHF (congestive heart failure), NYHA class III, chronic, diastolic ([place])- She has a normal volume status.  Paroxysmal atrial fibrillation ([place])- She has good rate/rhythm control.  I am having [name] maintain her albuterol, acetaminophen, ferrous sulfate, allopurinol, Trelegy Ellipta, guaiFENesin-dextromethorphan, morphine, metoprolol tartrate, torsemide, and isosorbide-hydrALAZINE. We will continue to administer cyanocobalamin.  No orders of the defined types were placed in this encounter.    Follow-up: No follow-ups on file.  [name], MD

## 2021-05-25 NOTE — Patient Instructions (Signed)
Visit Information  Phone number for Pharmacist: [phone]   Goals Addressed             This Visit's Progress    Manage My Medicine       Timeframe:  Long-Range Goal Priority:  High Start Date:        05/25/21                     Expected End Date:     05/25/22                  Follow Up Date April 2023   - call for medicine refill 2 or 3 days before it runs out - call if I am sick and can't take my medicine - keep a list of all the medicines I take; vitamins and herbals too - use a pillbox to sort medicine    Why is this important?   These steps will help you keep on track with your medicines.   Notes:         Care Plan : [place]  Updates made by Charlton Haws, RPH since 05/25/2021 12:00 AM     Problem: Hypertension, Hyperlipidemia, Atrial Fibrillation, Heart Failure, COPD and Chronic Kidney Disease   Priority: High     Long-Range Goal: Disease management   Start Date: 11/22/2020  Expected End Date: 11/22/2021  Recent Progress: On track  Priority: High  Note:   Current Barriers:  Unable to independently monitor therapeutic efficacy  Pharmacist Clinical Goal(s):  Patient will achieve adherence to monitoring guidelines and medication adherence to achieve therapeutic efficacy through collaboration with PharmD and provider.   Interventions: 1:1 collaboration with [name], MD regarding development and update of comprehensive plan of care as evidenced by provider attestation and co-signature Inter-disciplinary care team collaboration (see longitudinal plan of care) Comprehensive medication review performed; medication list updated in electronic medical record  Heart Failure / Hypertension / CKD 3b    Type: Diastolic Last ejection fraction: 60-65% (05/11/2020) NYHA Class: III (marked limitation of activity)   BP goal is:  <140/90 Patient checks BP at home 1-2x per week   Patient home BP readings are ranging: not checking;  wrist cuff inaccurate    Patient has failed these meds in past: n/a Patient is currently controlled on the following medications:  Metoprolol tartrate 25 mg - 1/2 tab BID Torsemide 20 mg - 2 in morning Isosorbide-hydralazine 20-37.5 mg BID   We discussed: p reports compliance with medications; denies issues   Plan: Recommend to continue current medication   AFIB    Hx recurrent GI bleeds - no anticoagulation Patient is currently rate controlled.   Patient has failed these meds in past: n/a Patient is currently controlled on the following medications:  Metoprolol tartrate 25 mg 1/2 tab BID   We discussed: pt denies issues with Afib recently; she reports HR is well controlled on current dose of metoprolol (previously she had bradycardia with 25 mg BID); she is also not a candidate for anticoagulation due to hx of recurrent GI bleeding    Plan: Continue current medications   Hyperlipidemia    LDL goal < 100  Patient has failed these meds in past: atorvastatin (cramps), pitavastatin (cost) Patient is currently uncontrolled on the following medications:  No medication   We discussed:  pt is >73 years old and LDL is 90, reasonable to continue without medication   Plan: Continue control with diet/exercise  COPD    Last spirometry score: not on file Patient has failed these meds in past: n/a Patient is currently controlled on the following medications:  Trelegy 1 puff daily Albuterol nebulizer Albuterol HFA prn 2 L O2   Using maintenance inhaler regularly? Yes Frequency of rescue inhaler use:  infrequently   We discussed:  pt reports Trelegy is not bothering her throat like it used to; she is fine to continue with Ellipta device   Plan: Continue current medications   Health maintenance   -Vaccine gaps: Shingrix, Covid booster -Pt was recently diagnosed with lung cancer; she has been scheduled for PET and MRI brain, treatment strategy pending results   Patient  Goals/Self-Care Activities Patient will: - take medications as prescribed -focus on medication adherence by pill box      Patient verbalizes understanding of instructions provided today and agrees to view in [place].  Telephone follow up appointment with pharmacy team member scheduled for: 6 months  Charlene Brooke, PharmD, [place], CPP Clinical Pharmacist [facility] [phone]

## 2021-05-26 ENCOUNTER — Telehealth: Payer: Self-pay | Admitting: *Deleted

## 2021-05-26 DIAGNOSIS — D472 Monoclonal gammopathy: Secondary | ICD-10-CM | POA: Diagnosis not present

## 2021-05-26 DIAGNOSIS — J9621 Acute and chronic respiratory failure with hypoxia: Secondary | ICD-10-CM | POA: Diagnosis not present

## 2021-05-26 DIAGNOSIS — I700 Atherosclerosis of aorta: Secondary | ICD-10-CM | POA: Diagnosis not present

## 2021-05-26 DIAGNOSIS — U099 Post covid-19 condition, unspecified: Secondary | ICD-10-CM | POA: Diagnosis not present

## 2021-05-26 DIAGNOSIS — D519 Vitamin B12 deficiency anemia, unspecified: Secondary | ICD-10-CM | POA: Diagnosis not present

## 2021-05-26 DIAGNOSIS — M1030 Gout due to renal impairment, unspecified site: Secondary | ICD-10-CM | POA: Diagnosis not present

## 2021-05-26 DIAGNOSIS — M810 Age-related osteoporosis without current pathological fracture: Secondary | ICD-10-CM | POA: Diagnosis not present

## 2021-05-26 DIAGNOSIS — K219 Gastro-esophageal reflux disease without esophagitis: Secondary | ICD-10-CM | POA: Diagnosis not present

## 2021-05-26 DIAGNOSIS — H539 Unspecified visual disturbance: Secondary | ICD-10-CM | POA: Diagnosis not present

## 2021-05-26 DIAGNOSIS — I130 Hypertensive heart and chronic kidney disease with heart failure and stage 1 through stage 4 chronic kidney disease, or unspecified chronic kidney disease: Secondary | ICD-10-CM | POA: Diagnosis not present

## 2021-05-26 DIAGNOSIS — M479 Spondylosis, unspecified: Secondary | ICD-10-CM | POA: Diagnosis not present

## 2021-05-26 DIAGNOSIS — M4802 Spinal stenosis, cervical region: Secondary | ICD-10-CM | POA: Diagnosis not present

## 2021-05-26 DIAGNOSIS — M19072 Primary osteoarthritis, left ankle and foot: Secondary | ICD-10-CM | POA: Diagnosis not present

## 2021-05-26 DIAGNOSIS — I5042 Chronic combined systolic (congestive) and diastolic (congestive) heart failure: Secondary | ICD-10-CM | POA: Diagnosis not present

## 2021-05-26 DIAGNOSIS — J449 Chronic obstructive pulmonary disease, unspecified: Secondary | ICD-10-CM | POA: Diagnosis not present

## 2021-05-26 DIAGNOSIS — D631 Anemia in chronic kidney disease: Secondary | ICD-10-CM | POA: Diagnosis not present

## 2021-05-26 DIAGNOSIS — I878 Other specified disorders of veins: Secondary | ICD-10-CM | POA: Diagnosis not present

## 2021-05-26 DIAGNOSIS — D630 Anemia in neoplastic disease: Secondary | ICD-10-CM | POA: Diagnosis not present

## 2021-05-26 DIAGNOSIS — M85832 Other specified disorders of bone density and structure, left forearm: Secondary | ICD-10-CM | POA: Diagnosis not present

## 2021-05-26 DIAGNOSIS — N1832 Chronic kidney disease, stage 3b: Secondary | ICD-10-CM | POA: Diagnosis not present

## 2021-05-26 DIAGNOSIS — J918 Pleural effusion in other conditions classified elsewhere: Secondary | ICD-10-CM | POA: Diagnosis not present

## 2021-05-26 DIAGNOSIS — G4733 Obstructive sleep apnea (adult) (pediatric): Secondary | ICD-10-CM | POA: Diagnosis not present

## 2021-05-26 DIAGNOSIS — I2781 Cor pulmonale (chronic): Secondary | ICD-10-CM | POA: Diagnosis not present

## 2021-05-26 DIAGNOSIS — E785 Hyperlipidemia, unspecified: Secondary | ICD-10-CM | POA: Diagnosis not present

## 2021-05-26 DIAGNOSIS — M19071 Primary osteoarthritis, right ankle and foot: Secondary | ICD-10-CM | POA: Diagnosis not present

## 2021-05-26 NOTE — Telephone Encounter (Signed)
I followed up to see if [name] scans have been authorized with insurance and noticed they have been. I called radiology and was able to schedule her for scans. Due to the scheduling of scans, I changed [name] schedule for her follow up with Dr. Julien Nordmann. I called [name] and updated on scans appt time, date, and pre-procedure instructions.  I also updated on her new appt for follow up at the cancer center as well as remind her of her appt with Dr. Roxan Hockey.  She verbalized understanding of all appts.

## 2021-05-29 DIAGNOSIS — I10 Essential (primary) hypertension: Secondary | ICD-10-CM

## 2021-05-29 DIAGNOSIS — I5042 Chronic combined systolic (congestive) and diastolic (congestive) heart failure: Secondary | ICD-10-CM

## 2021-05-29 DIAGNOSIS — I480 Paroxysmal atrial fibrillation: Secondary | ICD-10-CM | POA: Diagnosis not present

## 2021-05-29 DIAGNOSIS — E785 Hyperlipidemia, unspecified: Secondary | ICD-10-CM | POA: Diagnosis not present

## 2021-05-30 ENCOUNTER — Encounter (HOSPITAL_COMMUNITY): Payer: Self-pay | Admitting: Internal Medicine

## 2021-05-30 ENCOUNTER — Telehealth: Payer: Self-pay | Admitting: Internal Medicine

## 2021-05-30 ENCOUNTER — Encounter: Payer: Self-pay | Admitting: *Deleted

## 2021-05-30 DIAGNOSIS — H539 Unspecified visual disturbance: Secondary | ICD-10-CM | POA: Diagnosis not present

## 2021-05-30 DIAGNOSIS — C3491 Malignant neoplasm of unspecified part of right bronchus or lung: Secondary | ICD-10-CM | POA: Diagnosis not present

## 2021-05-30 DIAGNOSIS — M85832 Other specified disorders of bone density and structure, left forearm: Secondary | ICD-10-CM | POA: Diagnosis not present

## 2021-05-30 DIAGNOSIS — J918 Pleural effusion in other conditions classified elsewhere: Secondary | ICD-10-CM | POA: Diagnosis not present

## 2021-05-30 DIAGNOSIS — K219 Gastro-esophageal reflux disease without esophagitis: Secondary | ICD-10-CM | POA: Diagnosis not present

## 2021-05-30 DIAGNOSIS — I2781 Cor pulmonale (chronic): Secondary | ICD-10-CM | POA: Diagnosis not present

## 2021-05-30 DIAGNOSIS — I5042 Chronic combined systolic (congestive) and diastolic (congestive) heart failure: Secondary | ICD-10-CM | POA: Diagnosis not present

## 2021-05-30 DIAGNOSIS — D519 Vitamin B12 deficiency anemia, unspecified: Secondary | ICD-10-CM | POA: Diagnosis not present

## 2021-05-30 DIAGNOSIS — J449 Chronic obstructive pulmonary disease, unspecified: Secondary | ICD-10-CM | POA: Diagnosis not present

## 2021-05-30 DIAGNOSIS — M19072 Primary osteoarthritis, left ankle and foot: Secondary | ICD-10-CM | POA: Diagnosis not present

## 2021-05-30 DIAGNOSIS — E785 Hyperlipidemia, unspecified: Secondary | ICD-10-CM | POA: Diagnosis not present

## 2021-05-30 DIAGNOSIS — G4733 Obstructive sleep apnea (adult) (pediatric): Secondary | ICD-10-CM | POA: Diagnosis not present

## 2021-05-30 DIAGNOSIS — J9621 Acute and chronic respiratory failure with hypoxia: Secondary | ICD-10-CM | POA: Diagnosis not present

## 2021-05-30 DIAGNOSIS — M810 Age-related osteoporosis without current pathological fracture: Secondary | ICD-10-CM | POA: Diagnosis not present

## 2021-05-30 DIAGNOSIS — N1832 Chronic kidney disease, stage 3b: Secondary | ICD-10-CM | POA: Diagnosis not present

## 2021-05-30 DIAGNOSIS — M19071 Primary osteoarthritis, right ankle and foot: Secondary | ICD-10-CM | POA: Diagnosis not present

## 2021-05-30 DIAGNOSIS — D472 Monoclonal gammopathy: Secondary | ICD-10-CM | POA: Diagnosis not present

## 2021-05-30 DIAGNOSIS — I878 Other specified disorders of veins: Secondary | ICD-10-CM | POA: Diagnosis not present

## 2021-05-30 DIAGNOSIS — I130 Hypertensive heart and chronic kidney disease with heart failure and stage 1 through stage 4 chronic kidney disease, or unspecified chronic kidney disease: Secondary | ICD-10-CM | POA: Diagnosis not present

## 2021-05-30 DIAGNOSIS — M479 Spondylosis, unspecified: Secondary | ICD-10-CM | POA: Diagnosis not present

## 2021-05-30 DIAGNOSIS — U099 Post covid-19 condition, unspecified: Secondary | ICD-10-CM | POA: Diagnosis not present

## 2021-05-30 DIAGNOSIS — D631 Anemia in chronic kidney disease: Secondary | ICD-10-CM | POA: Diagnosis not present

## 2021-05-30 DIAGNOSIS — M4802 Spinal stenosis, cervical region: Secondary | ICD-10-CM | POA: Diagnosis not present

## 2021-05-30 DIAGNOSIS — M1030 Gout due to renal impairment, unspecified site: Secondary | ICD-10-CM | POA: Diagnosis not present

## 2021-05-30 DIAGNOSIS — I700 Atherosclerosis of aorta: Secondary | ICD-10-CM | POA: Diagnosis not present

## 2021-05-30 DIAGNOSIS — D630 Anemia in neoplastic disease: Secondary | ICD-10-CM | POA: Diagnosis not present

## 2021-05-30 NOTE — Telephone Encounter (Signed)
Herbert Deaner called from [facility] states pt BP- 200/100 with manual cuff on her bicep, automatic cuff on wrist 140/76   Pt. Had an episode over weekend, coughing up blood, difficulty w/ bowel movements. Taking dulcolax, with no relief.   Continues to have pain with urination, appetite minimal, poor sleep due to pain.   Current pain level- 5/10 abdominal area   Nausea and weakness while moving around. Mentioned may be past due on B12 injection.   Needs referral for toenail trimming.   Callback #- [phone]

## 2021-05-30 NOTE — Progress Notes (Signed)
I followed up on Ms. Caffey molecular test results with [facility] [employer] and noticed it is still being processed.

## 2021-05-31 DIAGNOSIS — M4802 Spinal stenosis, cervical region: Secondary | ICD-10-CM | POA: Diagnosis not present

## 2021-05-31 DIAGNOSIS — G4733 Obstructive sleep apnea (adult) (pediatric): Secondary | ICD-10-CM | POA: Diagnosis not present

## 2021-05-31 DIAGNOSIS — K219 Gastro-esophageal reflux disease without esophagitis: Secondary | ICD-10-CM | POA: Diagnosis not present

## 2021-05-31 DIAGNOSIS — J449 Chronic obstructive pulmonary disease, unspecified: Secondary | ICD-10-CM | POA: Diagnosis not present

## 2021-05-31 DIAGNOSIS — J918 Pleural effusion in other conditions classified elsewhere: Secondary | ICD-10-CM | POA: Diagnosis not present

## 2021-05-31 DIAGNOSIS — I2781 Cor pulmonale (chronic): Secondary | ICD-10-CM | POA: Diagnosis not present

## 2021-05-31 DIAGNOSIS — N1832 Chronic kidney disease, stage 3b: Secondary | ICD-10-CM | POA: Diagnosis not present

## 2021-05-31 DIAGNOSIS — D519 Vitamin B12 deficiency anemia, unspecified: Secondary | ICD-10-CM | POA: Diagnosis not present

## 2021-05-31 DIAGNOSIS — D472 Monoclonal gammopathy: Secondary | ICD-10-CM | POA: Diagnosis not present

## 2021-05-31 DIAGNOSIS — I5042 Chronic combined systolic (congestive) and diastolic (congestive) heart failure: Secondary | ICD-10-CM | POA: Diagnosis not present

## 2021-05-31 DIAGNOSIS — E785 Hyperlipidemia, unspecified: Secondary | ICD-10-CM | POA: Diagnosis not present

## 2021-05-31 DIAGNOSIS — M479 Spondylosis, unspecified: Secondary | ICD-10-CM | POA: Diagnosis not present

## 2021-05-31 DIAGNOSIS — D631 Anemia in chronic kidney disease: Secondary | ICD-10-CM | POA: Diagnosis not present

## 2021-05-31 DIAGNOSIS — U099 Post covid-19 condition, unspecified: Secondary | ICD-10-CM | POA: Diagnosis not present

## 2021-05-31 DIAGNOSIS — I130 Hypertensive heart and chronic kidney disease with heart failure and stage 1 through stage 4 chronic kidney disease, or unspecified chronic kidney disease: Secondary | ICD-10-CM | POA: Diagnosis not present

## 2021-05-31 DIAGNOSIS — M19071 Primary osteoarthritis, right ankle and foot: Secondary | ICD-10-CM | POA: Diagnosis not present

## 2021-05-31 DIAGNOSIS — J9621 Acute and chronic respiratory failure with hypoxia: Secondary | ICD-10-CM | POA: Diagnosis not present

## 2021-05-31 DIAGNOSIS — M85832 Other specified disorders of bone density and structure, left forearm: Secondary | ICD-10-CM | POA: Diagnosis not present

## 2021-05-31 DIAGNOSIS — M19072 Primary osteoarthritis, left ankle and foot: Secondary | ICD-10-CM | POA: Diagnosis not present

## 2021-05-31 DIAGNOSIS — D630 Anemia in neoplastic disease: Secondary | ICD-10-CM | POA: Diagnosis not present

## 2021-05-31 DIAGNOSIS — M810 Age-related osteoporosis without current pathological fracture: Secondary | ICD-10-CM | POA: Diagnosis not present

## 2021-05-31 DIAGNOSIS — H539 Unspecified visual disturbance: Secondary | ICD-10-CM | POA: Diagnosis not present

## 2021-05-31 DIAGNOSIS — M1030 Gout due to renal impairment, unspecified site: Secondary | ICD-10-CM | POA: Diagnosis not present

## 2021-05-31 DIAGNOSIS — I878 Other specified disorders of veins: Secondary | ICD-10-CM | POA: Diagnosis not present

## 2021-05-31 DIAGNOSIS — I700 Atherosclerosis of aorta: Secondary | ICD-10-CM | POA: Diagnosis not present

## 2021-06-02 ENCOUNTER — Ambulatory Visit (HOSPITAL_COMMUNITY)
Admission: RE | Admit: 2021-06-02 | Discharge: 2021-06-02 | Disposition: A | Discharge status: Home / Self Care | Payer: Medicare Other | Source: Ambulatory Visit | Attending: Internal Medicine | Admitting: Internal Medicine

## 2021-06-02 ENCOUNTER — Other Ambulatory Visit: Payer: Self-pay

## 2021-06-02 DIAGNOSIS — C3490 Malignant neoplasm of unspecified part of unspecified bronchus or lung: Secondary | ICD-10-CM | POA: Insufficient documentation

## 2021-06-02 DIAGNOSIS — R9082 White matter disease, unspecified: Secondary | ICD-10-CM | POA: Diagnosis not present

## 2021-06-02 DIAGNOSIS — C3491 Malignant neoplasm of unspecified part of right bronchus or lung: Secondary | ICD-10-CM | POA: Diagnosis not present

## 2021-06-02 MED ORDER — GADOBUTROL 1 MMOL/ML IV SOLN
9.0000 mL | Freq: Once | INTRAVENOUS | Status: AC | PRN
  Administered 2021-06-02: 9 mL via INTRAVENOUS

## 2021-06-05 ENCOUNTER — Encounter: Payer: Self-pay | Admitting: *Deleted

## 2021-06-05 ENCOUNTER — Other Ambulatory Visit: Payer: Self-pay | Admitting: Thoracic Surgery (Cardiothoracic Vascular Surgery)

## 2021-06-05 ENCOUNTER — Encounter (HOSPITAL_COMMUNITY): Payer: Self-pay | Admitting: Internal Medicine

## 2021-06-05 DIAGNOSIS — C799 Secondary malignant neoplasm of unspecified site: Secondary | ICD-10-CM

## 2021-06-05 NOTE — Progress Notes (Signed)
Patient's molecular test results are completed, I printed and placed on Dr. Worthy Flank desk.

## 2021-06-06 ENCOUNTER — Telehealth: Payer: Self-pay | Admitting: Internal Medicine

## 2021-06-06 ENCOUNTER — Ambulatory Visit: Payer: Medicare Other | Admitting: Internal Medicine

## 2021-06-06 ENCOUNTER — Other Ambulatory Visit: Payer: Self-pay | Admitting: Internal Medicine

## 2021-06-06 DIAGNOSIS — J918 Pleural effusion in other conditions classified elsewhere: Secondary | ICD-10-CM | POA: Diagnosis not present

## 2021-06-06 DIAGNOSIS — D630 Anemia in neoplastic disease: Secondary | ICD-10-CM | POA: Diagnosis not present

## 2021-06-06 DIAGNOSIS — E785 Hyperlipidemia, unspecified: Secondary | ICD-10-CM | POA: Diagnosis not present

## 2021-06-06 DIAGNOSIS — G4733 Obstructive sleep apnea (adult) (pediatric): Secondary | ICD-10-CM | POA: Diagnosis not present

## 2021-06-06 DIAGNOSIS — M85832 Other specified disorders of bone density and structure, left forearm: Secondary | ICD-10-CM | POA: Diagnosis not present

## 2021-06-06 DIAGNOSIS — M19072 Primary osteoarthritis, left ankle and foot: Secondary | ICD-10-CM | POA: Diagnosis not present

## 2021-06-06 DIAGNOSIS — M19071 Primary osteoarthritis, right ankle and foot: Secondary | ICD-10-CM | POA: Diagnosis not present

## 2021-06-06 DIAGNOSIS — N1832 Chronic kidney disease, stage 3b: Secondary | ICD-10-CM | POA: Diagnosis not present

## 2021-06-06 DIAGNOSIS — I700 Atherosclerosis of aorta: Secondary | ICD-10-CM | POA: Diagnosis not present

## 2021-06-06 DIAGNOSIS — F418 Other specified anxiety disorders: Secondary | ICD-10-CM

## 2021-06-06 DIAGNOSIS — M4802 Spinal stenosis, cervical region: Secondary | ICD-10-CM | POA: Diagnosis not present

## 2021-06-06 DIAGNOSIS — I130 Hypertensive heart and chronic kidney disease with heart failure and stage 1 through stage 4 chronic kidney disease, or unspecified chronic kidney disease: Secondary | ICD-10-CM | POA: Diagnosis not present

## 2021-06-06 DIAGNOSIS — J9621 Acute and chronic respiratory failure with hypoxia: Secondary | ICD-10-CM | POA: Diagnosis not present

## 2021-06-06 DIAGNOSIS — U099 Post covid-19 condition, unspecified: Secondary | ICD-10-CM | POA: Diagnosis not present

## 2021-06-06 DIAGNOSIS — D472 Monoclonal gammopathy: Secondary | ICD-10-CM | POA: Diagnosis not present

## 2021-06-06 DIAGNOSIS — M1030 Gout due to renal impairment, unspecified site: Secondary | ICD-10-CM | POA: Diagnosis not present

## 2021-06-06 DIAGNOSIS — M810 Age-related osteoporosis without current pathological fracture: Secondary | ICD-10-CM | POA: Diagnosis not present

## 2021-06-06 DIAGNOSIS — M479 Spondylosis, unspecified: Secondary | ICD-10-CM | POA: Diagnosis not present

## 2021-06-06 DIAGNOSIS — D519 Vitamin B12 deficiency anemia, unspecified: Secondary | ICD-10-CM | POA: Diagnosis not present

## 2021-06-06 DIAGNOSIS — I2781 Cor pulmonale (chronic): Secondary | ICD-10-CM | POA: Diagnosis not present

## 2021-06-06 DIAGNOSIS — J449 Chronic obstructive pulmonary disease, unspecified: Secondary | ICD-10-CM | POA: Diagnosis not present

## 2021-06-06 DIAGNOSIS — H539 Unspecified visual disturbance: Secondary | ICD-10-CM | POA: Diagnosis not present

## 2021-06-06 DIAGNOSIS — I878 Other specified disorders of veins: Secondary | ICD-10-CM | POA: Diagnosis not present

## 2021-06-06 DIAGNOSIS — K219 Gastro-esophageal reflux disease without esophagitis: Secondary | ICD-10-CM | POA: Diagnosis not present

## 2021-06-06 DIAGNOSIS — D631 Anemia in chronic kidney disease: Secondary | ICD-10-CM | POA: Diagnosis not present

## 2021-06-06 DIAGNOSIS — I5042 Chronic combined systolic (congestive) and diastolic (congestive) heart failure: Secondary | ICD-10-CM | POA: Diagnosis not present

## 2021-06-06 MED ORDER — CLONAZEPAM 1 MG PO TABS: 1.0000 mg | ORAL_TABLET | Freq: Two times a day (BID) | ORAL | 0 refills | Status: AC | PRN

## 2021-06-06 NOTE — Telephone Encounter (Signed)
Home Health verbal orders-caller/Agency: [place] number: [phone]  Requesting OT/PT/Skilled nursing/Social Work/Speech: Social Service work assessment

## 2021-06-06 NOTE — Telephone Encounter (Signed)
Sharyn Lull from [facility] calling in  [place] she was out visitng w/ patient today & patient is having some issues w/ anxiety.. says she feels really anxious & nervous  Would like provider to send something to pharmacy  Patient call back [phone]  Pharmacy:  [facility] [place] [address]  Phone:  [phone] Fax:  [phone]

## 2021-06-06 NOTE — Telephone Encounter (Signed)
Verbal orders given  

## 2021-06-06 NOTE — Progress Notes (Signed)
[name] OFFICE PROGRESS NOTE  Laurie Lima, MD [name] [id number]  DIAGNOSIS: Stage IV (Tx, N3, M1C) non-small cell lung cancer, adenocarcinoma. She presented with a left primary lung lesion and widespread metastatic disease in the thoracic nodes, left pleural space, abdomen pelvic lymph nodes, and bones.  She was diagnosed in October 2022.  Molecular studies: The patient has positive EGFR mutation in exon 21 (L858R)  PRIOR THERAPY: None   CURRENT THERAPY:  1) Targeted treatment with Tagrisso 80 mg p.o. daily.  First dose expected in the next few days.  2) furl for palliative radiation  INTERVAL HISTORY: [name] 80 y.o. female returns to the clinic today for a follow-up visit for her newly diagnosed stage IV lung cancer.  She is accompanied by her granddaughter.  The patient was first seen in the clinic on 05/23/2021.  At that point in time, the patient needed to complete the staging work-up with a PET scan and brain MRI.  She was also referred to cardiothoracic surgery for consideration of Pleurx catheter placement due to her left-sided effusion.  She saw Dr. Roxan Hockey yesterday.  She had a chest x-ray performed which showed a large left pleural effusion.  At her appointment yesterday, the patient opted to wait until her appointment for Pleurx placement to have the fluid drained.  However, the patient is now noting that she does not think she can wait.  She is endorsing shortness of breath.  She has an appointment for a Pleurx placement on 06/15/21.    She is in poor general health and has a lot of generalized fatigue and weakness.  Overall, the patient's main concern is related to pain. She has been cutting and MS Contin 15 mg pill in half.  Not surprisingly, the patient reports hallucinations with taking this.  She localizes majority of her pain to the left hip that radiates up to her left chest.  She denies any hemoptysis.  She reports a  cough which is productive and produces clear mucus.  She is on 2 to 3 L of supplemental oxygen via nasal cannula.  She denies any fever, chills, or night sweats.  She has occasional nausea without any vomiting or diarrhea.  She has a prescription for Phenergan for nausea.  The patient reports some constipation likely secondary to her pain medication use.  She has Dulcolax. Her brain MRI was negative for any metastatic disease to the brain.  At the end of the visit, I reviewed the patient's brain MRI with her.  In retrospect, she reports she had a headache the day of her brain MRI.  She denies any facial drooping, speech changes, or extremity weakness. She is here today for evaluation and to review her molecular studies and for more detailed discussion about her current condition and recommended treatment options.  MEDICAL HISTORY: Past Medical History:  Diagnosis Date   Allergy    Angiodysplasia of cecum    Angiodysplasia of duodenum    Arthritis    Asteatotic eczema 02/25/2016   Asthma    Atrial fibrillation ([place]) 10/17/2017   Blood transfusion without reported diagnosis    Cataract    CHF (congestive heart failure), NYHA class III, chronic, combined ([place]) 08/10/2017   Colonic polyp 02/16/2008   Tubular adenoma   COPD (chronic obstructive pulmonary disease) ([place]) 01/27/98   Cor pulmonale ([place]) 11/16/2014   GERD 07/30/1992   Gout    HTN (hypertension) 07/30/1988   Hyperlipidemia with  target LDL less than 100 11/27/1996        Kidney stone 1960, 1972, 1991   MGUS (monoclonal gammopathy of unknown significance) 11/08/2016   Morbid obesity ([place]) 04/19/2010   She agrees to work on her lifestyle modifications to help her lose weight.    OSA (obstructive sleep apnea) 06/22/2013   Osteopenia, senile 02/05/2013   July 2014  -2.1 left femur -1.9 left forearm    Prediabetes 11/13/2006   Renal insufficiency    Stenosis of cervical spine with myelopathy ([facility]) 01/01/2018   Ulcer of leg, chronic, right ([place])  10/10/2011   ULCERATIVE COLITIS-LEFT SIDE 03/19/2008        Vitamin B12 deficiency anemia 11/13/2006        Vitamin D deficiency 02/06/2013    ALLERGIES:  is allergic to celebrex [celecoxib], amlodipine besylate, enalapril, lipitor [atorvastatin], amoxicillin, codeine sulfate, and hydrocodone-acetaminophen.  MEDICATIONS:  Current Outpatient Medications  Medication Sig Dispense Refill   acetaminophen (TYLENOL) 325 MG tablet Take 325-650 mg by mouth daily as needed for moderate pain, fever or headache.      albuterol (PROAIR HFA) 108 (90 Base) MCG/ACT inhaler Inhale 1-2 puffs into the lungs every 6 (six) hours as needed for wheezing or shortness of breath. 18 g 11   allopurinol (ZYLOPRIM) 100 MG tablet TAKE 1 TABLET(100 MG) BY MOUTH DAILY 90 tablet 1   clonazePAM (KLONOPIN) 1 MG tablet Take 1 tablet (1 mg total) by mouth 2 (two) times daily as needed for anxiety. 180 tablet 0   ferrous sulfate 325 (65 FE) MG tablet Take 1 tablet (325 mg total) by mouth 2 (two) times daily with a meal. 180 tablet 1   Fluticasone-Umeclidin-Vilant (TRELEGY ELLIPTA) 100-62.5-25 MCG/INH AEPB Inhale 1 puff into the lungs daily. 120 each 1   isosorbide-hydrALAZINE (BIDIL) 20-37.5 MG tablet TAKE 1 TABLET BY MOUTH THREE TIMES DAILY 270 tablet 0   metoprolol tartrate (LOPRESSOR) 25 MG tablet TAKE 1 TABLET(25 MG) BY MOUTH TWICE DAILY (Patient taking differently: Take 12.5 mg by mouth 2 (two) times daily.) 180 tablet 1   morphine (MS CONTIN) 15 MG 12 hr tablet Take 1 tablet (15 mg total) by mouth 2 (two) times daily as needed for pain. 60 tablet 0   prochlorperazine (COMPAZINE) 10 MG tablet Take 1 tablet (10 mg total) by mouth every 6 (six) hours as needed. 30 tablet 2   torsemide (DEMADEX) 20 MG tablet TAKE 2 TABLETS(40 MG) BY MOUTH EVERY MORNING 180 tablet 0   guaiFENesin-dextromethorphan (ROBITUSSIN DM) 100-10 MG/5ML syrup Take 5 mLs by mouth every 4 (four) hours as needed for cough (chest congestion). (Patient not taking:  No sig reported) 118 mL 0   osimertinib mesylate (TAGRISSO) 80 MG tablet Take 1 tablet (80 mg total) by mouth daily. 30 tablet 3   Current Facility-Administered Medications  Medication Dose Route Frequency Provider Last Rate Last Admin   cyanocobalamin ((VITAMIN B-12)) injection 1,000 mcg  1,000 mcg Intramuscular Q30 days Laurie Lima, MD   1,000 mcg at 01/31/21 1507   Facility-Administered Medications Ordered in Other Visits  Medication Dose Route Frequency Provider Last Rate Last Admin   lidocaine (XYLOCAINE) 1 % (with pres) injection             SURGICAL HISTORY:  Past Surgical History:  Procedure Laterality Date   BRONCHOSCOPY     COLONOSCOPY     COLONOSCOPY WITH PROPOFOL N/A 02/23/2015   Procedure: COLONOSCOPY WITH PROPOFOL;  Surgeon: Ladene Artist, MD;  Location: [facility];  Service: Endoscopy;  Laterality: N/A;   COLONOSCOPY WITH PROPOFOL N/A 08/12/2018   Procedure: COLONOSCOPY WITH PROPOFOL;  Surgeon: Ladene Artist, MD;  Location: [facility];  Service: Endoscopy;  Laterality: N/A;   COLONOSCOPY WITH PROPOFOL N/A 05/09/2020   Procedure: COLONOSCOPY WITH PROPOFOL;  Surgeon: Ladene Artist, MD;  Location: [facility];  Service: Endoscopy;  Laterality: N/A;  To Splenic Flexture   ESOPHAGOGASTRODUODENOSCOPY (EGD) WITH PROPOFOL N/A 02/23/2015   Procedure: ESOPHAGOGASTRODUODENOSCOPY (EGD) WITH PROPOFOL;  Surgeon: Ladene Artist, MD;  Location: [facility];  Service: Endoscopy;  Laterality: N/A;   ESOPHAGOGASTRODUODENOSCOPY (EGD) WITH PROPOFOL N/A 08/12/2018   Procedure: ESOPHAGOGASTRODUODENOSCOPY (EGD) WITH PROPOFOL;  Surgeon: Ladene Artist, MD;  [facility]: [facility];  Service: Endoscopy;  Laterality: N/A;   ESOPHAGOGASTRODUODENOSCOPY (EGD) WITH PROPOFOL N/A 05/08/2020   Procedure: ESOPHAGOGASTRODUODENOSCOPY (EGD) WITH PROPOFOL;  Surgeon: Yetta Flock, MD;  Location: [facility];  Service: Gastroenterology;  Laterality: N/A;   HOT HEMOSTASIS N/A 08/12/2018    Procedure: HOT HEMOSTASIS (ARGON PLASMA COAGULATION/BICAP);  Surgeon: Ladene Artist, MD;  Location: [facility];  Service: Endoscopy;  Laterality: N/A;  EGD and Colon APC   HOT HEMOSTASIS N/A 05/08/2020   Procedure: HOT HEMOSTASIS (ARGON PLASMA COAGULATION/BICAP);  Surgeon: Yetta Flock, MD;  Location: [facility];  Service: Gastroenterology;  Laterality: N/A;   LAPAROTOMY N/A 08/11/2020   Procedure: EXPLORATORY LAPAROTOMY PARTIAL COLECTOMY AND COLOSTOMY WITH RESECTION OF A MESSENTERIC MASS;  Surgeon: Coralie Keens, MD;  Location: [facility];  Service: General;  Laterality: N/A;   POLYPECTOMY      REVIEW OF SYSTEMS:   Review of Systems  Constitutional: Positive for generalized fatigue and generalized weakness.  Negative for chills, fever and unexpected weight change.  HENT:   Negative for mouth sores, nosebleeds, sore throat and trouble swallowing.   Eyes: Negative for eye problems and icterus.  Respiratory: Positive for shortness of breath and cough.  Negative for hemoptysis and wheezing.   Cardiovascular: Positive for rib pain (left). Negative for leg swelling.  Gastrointestinal: Positive for nausea. Positive for constipation. Negative for abdominal pain, diarrhea, and vomiting.  Genitourinary: Negative for bladder incontinence, difficulty urinating, dysuria, frequency and hematuria.   Musculoskeletal: Negative for back pain, gait problem, neck pain and neck stiffness.  Skin: Negative for itching and rash.  Neurological: Positive for headache x1 on 06/02/21. Negative for dizziness, extremity weakness, gait problem, light-headedness and seizures.  Hematological: Negative for adenopathy. Does not bruise/bleed easily.  Psychiatric/Behavioral: Negative for confusion, depression and sleep disturbance. The patient is not nervous/anxious.     PHYSICAL EXAMINATION:  Blood pressure 113/68, pulse 77, temperature 98.1 F (36.7 C), temperature source Temporal, resp. rate 18, height 5'  (1.524 m), SpO2 90 %.  ECOG PERFORMANCE STATUS: 2-3  Physical Exam  Constitutional: Oriented to person, place, and time and chronically ill appearing female. HENT:  Head: Normocephalic and atraumatic.  Mouth/Throat: Oropharynx is clear and moist. No oropharyngeal exudate.  Eyes: Conjunctivae are normal. Right eye exhibits no discharge. Left eye exhibits no discharge. No scleral icterus.  Neck: Normal range of motion. Neck supple.  Cardiovascular: Normal rate, regular rhythm, systolic murmur noted and intact distal pulses.   Pulmonary/Chest: Effort normal. Absent breath sounds in left lung. No respiratory distress. No wheezes. No rales. On 3 L supplemental oxygen via nasal cannula. Abdominal: Soft. Bowel sounds are normal. Exhibits no distension and no mass. There is no tenderness.  Musculoskeletal: Normal range of motion. Exhibits no edema.  Lymphadenopathy:    No cervical adenopathy.  Neurological: Alert and oriented to person, place, and time. Exhibits muscle wasting. Examined in the wheelchair.  Skin: Skin is warm and dry. No rash noted. Not diaphoretic. Venous statis dermatitis noted on lower extremities.  Psychiatric: Mood, memory and judgment normal.  Vitals reviewed.  LABORATORY DATA: Lab Results  Component Value Date   WBC 10.5 06/08/2021   HGB 10.0 (L) 06/08/2021   HCT 32.8 (L) 06/08/2021   MCV 83.2 06/08/2021   PLT 220 06/08/2021      Chemistry      Component Value Date/Time   NA 140 06/08/2021 0902   NA 142 04/11/2016 1007   K 4.2 06/08/2021 0902   K 3.3 (L) 04/11/2016 1007   CL 98 06/08/2021 0902   CO2 32 06/08/2021 0902   CO2 31 (H) 04/11/2016 1007   BUN 40 (H) 06/08/2021 0902   BUN 24.4 04/11/2016 1007   CREATININE 1.37 (H) 06/08/2021 0902   CREATININE 1.36 (H) 02/04/2020 1443   CREATININE 1.1 04/11/2016 1007      Component Value Date/Time   CALCIUM 10.2 06/08/2021 0902   CALCIUM 9.8 04/11/2016 1007   ALKPHOS 60 06/08/2021 0902   ALKPHOS 82 04/11/2016  1007   AST 13 (L) 06/08/2021 0902   AST 12 04/11/2016 1007   ALT 6 06/08/2021 0902   ALT 12 04/11/2016 1007   BILITOT 0.3 06/08/2021 0902   BILITOT 0.35 04/11/2016 1007       RADIOGRAPHIC STUDIES:  DG Chest 2 View  Result Date: 06/07/2021 CLINICAL DATA:  metastatic adenocarcinoma EXAM: CHEST - 2 VIEW COMPARISON:  Chest radiograph May 05, 2021 FINDINGS: Large left pleural effusion. Overlying opacities in the left lung. Cardiac silhouette is largely obscured and mildly displaced the right. Prominent right hilar and left suprahilar silhouette. IMPRESSION: 1. Large left pleural effusion, increased 2. Overlying left lung opacities and right basilar opacities, which could represent atelectasis, pneumonia and/or aspiration. 3. Prominent right hilar and left suprahilar silhouette. Please correlate with same day PET-CT for evaluation of malignancy. Electronically Signed   By: Margaretha Sheffield M.D.   On: 06/07/2021 15:29   MR BRAIN W WO CONTRAST  Result Date: 06/05/2021 CLINICAL DATA:  80 year old female non-small cell lung cancer. Staging. EXAM: MRI HEAD WITHOUT AND WITH CONTRAST TECHNIQUE: Multiplanar, multiecho pulse sequences of the brain and surrounding structures were obtained without and with intravenous contrast. CONTRAST:  25m GADAVIST GADOBUTROL 1 MMOL/ML IV SOLN COMPARISON:  Brain MRI without contrast 09/24/2018. FINDINGS: Brain: Stable cerebral volume since 2020. In the posterior right parietal lobe there is a punctate area of cortical restricted diffusion (series 9, image 81) with no associated enhancement, and faint if any T2/FLAIR hyperintensity. No other abnormal diffusion. No abnormal enhancement identified. Black blood technique postcontrast imaging. No midline shift, mass effect, or evidence of intracranial mass lesion. No dural thickening. Chronic increased perivascular spaces in the brain. Chronic but increased widely scattered patchy subcortical and periventricular white matter T2 and  FLAIR hyperintensity. Similar chronic T2 heterogeneity in the pons. But no cortical encephalomalacia or chronic cerebral blood products identified. No ventriculomegaly, extra-axial collection or acute intracranial hemorrhage. Cervicomedullary junction and pituitary are within normal limits. Vascular: Major intracranial vascular flow voids are stable. Skull and upper cervical spine: Negative visible cervical spine and spinal cord. Bone marrow signal appears stable since 2020, within normal limits. Sinuses/Orbits: Stable, negative. Other: Mastoids remain clear. Visible internal auditory structures appear normal. Negative visible scalp and face. IMPRESSION: 1. Solitary punctate abnormal diffusion in the right parietal lobe without  enhancement. This is most likely a recent lacunar infarct (see also #3) which might be clinically silent. Repeat brain MRI without and with contrast in 3 months could be obtained to document resolution. 2. No metastatic disease identified. 3. Chronic but increased since 2020 signal changes in the cerebral white matter and pons most commonly due to chronic small vessel disease. Electronically Signed   By: Genevie Ann M.D.   On: 06/05/2021 10:48   NM PET Image Initial (PI) Skull Base To Thigh (F-18 FDG)  Result Date: 06/07/2021 CLINICAL DATA:  Initial treatment strategy for non-small-cell lung cancer. EXAM: NUCLEAR MEDICINE PET SKULL BASE TO THIGH TECHNIQUE: 8.8 mCi F-18 FDG was injected intravenously. Full-ring PET imaging was performed from the skull base to thigh after the radiotracer. CT data was obtained and used for attenuation correction and anatomic localization. Fasting blood glucose: 121 mg/dl COMPARISON:  08/09/2020 abdominopelvic CT.  Chest CT of 05/20/2018 FINDINGS: Mediastinal blood pool activity: SUV max 2.6 Liver activity: SUV max NA NECK: No areas of abnormal hypermetabolism. Incidental CT findings: No cervical adenopathy. CHEST: Moderate to large left-sided pleural effusion  with extensive pleural hypermetabolism consistent with pleural metastasis. Example inferiorly and posteriorly at a S.U.V. max of 11.2 (75/4). Within collapsed left lung, there are areas of hypermetabolism which likely represent lung primary or primaries. Example likely within the central left upper lobe at a S.U.V. max of 18.9 on 58/4. Within the posterior left lower lobe at a S.U.V. max of 9.8 on 70/4. Left hilar nodal metastasis with direct extension into the left side of the mediastinum. Example at a S.U.V. max of 24.5 including on 59/4. Contralateral hilar nodal metastasis, including at a S.U.V. max of 15.9 on 52/4. A right paravertebral versus pleural hypermetabolic nodule/implant measures 6 mm and a S.U.V. max of 4.2 on 77/4. Incidental CT findings: Aortic and coronary artery calcification. Pulmonary artery enlargement, outflow tract 3.4 cm. ABDOMEN/PELVIS: Anterior splenic focus of hypermetabolism is without CT correlate at a S.U.V. max of 5.7 on approximately image 103/4, suspicious. Distal colonic hypermetabolism is favored to be physiologic. A small left external iliac node corresponds to hypermetabolism at a S.U.V. max of 6.4 on 141/4. Nodule anterior to the right iliacus muscle measures 6 mm and a S.U.V. max of 4.4 on 113/4. Anterior pelvic wall soft tissue implant, about the posterior aspect of an area of pelvic wall laxity measures 3.1 x 1.8 cm and a S.U.V. max of 15.8 on 145/4. Incidental CT findings: Bilateral renal lesions which are likely cysts and complex cysts, but suboptimally evaluated. Normal adrenal glands. Descending loop colostomy. Large and small bowel within an area of pelvic anterior wall laxity. Abdominal aortic atherosclerosis. SKELETON: Osseous and soft tissue metastasis within the right iliac wing measures 2.4 cm and a S.U.V. max of 24.5 on 121/4. At least 1 possible left rib metastasis is difficult to differentiate from circumferential pleural hypermetabolism. Incidental CT  findings: none IMPRESSION: 1. Widespread metastatic disease, including to thoracic nodes, left pleural space, abdomen/pelvis, bones. The primary or primaries are likely within the compressed left lung, as detailed above. 2. Incidental findings, including: Moderate left pleural effusion. Coronary artery atherosclerosis. Aortic Atherosclerosis (ICD10-I70.0). Pulmonary artery enlargement suggests pulmonary arterial hypertension. Electronically Signed   By: Abigail Miyamoto M.D.   On: 06/07/2021 15:44     ASSESSMENT/PLAN:  Is a very pleasant 80 year old Caucasian female diagnosed with stage IV (TX, N3, M1c) non-small cell lung cancer, adenocarcinoma.  The patient presented with a left primary lung lesion and widespread  metastatic disease in the thoracic nodes, left pleural space, abdomen pelvic lymph nodes, and bones.  She was diagnosed in October 2022. Her molecular studies show that she is positive for an EGFR mutation.  The patient was seen with Dr. Julien Nordmann today.  Dr. Julien Nordmann reviewed the patient's brain MRI and PET scan with her.  Dr. Julien Nordmann discussed that she is stage IV and that her condition is treatable but not curable.  Dr. Julien Nordmann she is a candidate for targeted treatment with Tagrisso 80 mg p.o. daily due to her EGFR mutation. The patient is interested in this option and she is expected to start these treatment in the next few days.    She will be seen by the pharmacist for oral oncolytic education today.   We obtained a baseline EKG today which shows that she does not have a baseline prolonged QT.  Her QT/QTc is 330/408 ms   We discussed with him the adverse effect of this treatment including skin rash, diarrhea, interstitial lung disease, liver or renal dysfunction.   We will see her back in about 2.5 weeks for evaluation and repeat blood work.   Regarding her pain medication, we discussed with the patient that she cannot cut her MS Contin in half which is why she is likely having side  effects.  We will refer the patient to radiation oncology for consideration of palliative radiation to the painful osseous lesions.  Regarding the large left pleural effusion, the patient scheduled for Pleurx catheter next week on 06/15/2021.  The patient reports today that she does not think she can wait to have the pleural effusion drained at that time due to her shortness of breath.  We will arrange for a stat thoracentesis to be performed later today.  I sent her prescription for Compazine 10 mg p.o. every 6 hours as needed for nausea to her pharmacy.  I reviewed the patient's brain MRI with her which showed a nonspecific punctate lesion which could represent a recent lacunar infarct.  The patient reports that she had a headache the day of her MRI and is wondering if that could have been the etiology of her headache.  Unclear if this punctate lesion would cause a headache but I reviewed stroke symptoms with the patient.  I advised the patient to always seek emergency evaluation if she develops any unretractable headaches, visual changes, facial drooping, speech changes, or extremity weakness to seek emergency evaluation.  We will arrange for a repeat brain MRI in 3 months to assess for resolution of this area. She is not having any headaches, slurred speech, extremity weakness, or facial droop at this time.    The patient was advised to call immediately if she has any concerning symptoms in the interval. The patient voices understanding of current disease status and treatment options and is in agreement with the current care plan. All questions were answered. The patient knows to call the clinic with any problems, questions or concerns. We can certainly see the patient much sooner if necessary   Orders Placed This Encounter  Procedures   US Thoracentesis Asp Pleural space w/IMG guide    Standing Status:   Future    Number of Occurrences:   1    Standing Expiration Date:   06/08/2022    Order  Specific Question:   Are labs required for specimen collection?    Answer:   No    Order Specific Question:   Reason for Exam (SYMPTOM  OR DIAGNOSIS REQUIRED)  Answer:   Large left pleural effusion. Getting plurex next week but can't wait    Order Specific Question:   Preferred imaging location?    Answer:   [facility]   MR Brain W Wo Contrast    Standing Status:   Future    Standing Expiration Date:   06/08/2022    Order Specific Question:   If indicated for the ordered procedure, I authorize the administration of contrast media per Radiology protocol    Answer:   Yes    Order Specific Question:   What is the patient's sedation requirement?    Answer:   No Sedation    Order Specific Question:   Does the patient have a pacemaker or implanted devices?    Answer:   No    Order Specific Question:   Use SRS Protocol?    Answer:   No    Order Specific Question:   Preferred imaging location?    Answer:   [facility] (table limit - 550 lbs)   CBC with Differential ([place] Only)    Standing Status:   Future    Standing Expiration Date:   06/08/2022   CMP ([place] only)    Standing Status:   Future    Standing Expiration Date:   06/08/2022   Ambulatory referral to Radiation Oncology    Referral Priority:   Routine    Referral Type:   Consultation    Referral Reason:   Specialty Services Required    Requested Specialty:   Radiation Oncology    Number of Visits Requested:   [place], PA-C 06/08/21  ADDENDUM: Hematology/Oncology Attending: I had a face-to-face encounter with the patient.  I reviewed her record, lab and recommended her care plan.  This is a very pleasant 80 years old white female recently diagnosed with a stage IV (TX, N3, M1 C) non-small cell lung cancer, adenocarcinoma presented with left to primary lung lesion and widespread metastatic disease in the thoracic nodes, left pleural space, abdominal and pelvic lymph  node as well as bone metastasis diagnosed in October 2022 was positive EGFR mutation in exon 21 (L858R). The patient had a recent MRI of the brain that showed solitary punctate abnormal diffusion in the right parietal lobe without enhancement most likely consistent with recent lacunar infarct which might be clinically silent.  No evidence of metastatic disease identified.  The patient also had a PET scan on 06/07/2021 that showed widespread metastatic disease involving the thoracic nodes in addition to the above findings. I had a lengthy discussion with the patient and her daughter today about her current condition and treatment options. I recommended for the patient treatment with targeted therapy with Tagrisso 80 mg p.o. daily. I discussed with her the adverse effect of this treatment and she will meet with the pharmacist for oral oncolytic for education as well as to help The patient with refill of her medication. The patient will come back for follow-up visit in around 2 to 3 weeks for evaluation and management of any adverse effect of her treatment. For the painful metastatic bone disease, the patient will be referred to radiation oncology for consideration of palliative radiotherapy to the painful lesion. She was advised to call immediately if she has any other concerning symptoms in the interval.  The total time spent in the appointment was 40 minutes. Disclaimer: This note was dictated with voice recognition software. Similar sounding words  can inadvertently be transcribed and may be missed upon review. Eilleen Kempf, MD 06/09/21

## 2021-06-07 ENCOUNTER — Encounter: Payer: Self-pay | Admitting: *Deleted

## 2021-06-07 ENCOUNTER — Ambulatory Visit (HOSPITAL_COMMUNITY)
Admission: RE | Admit: 2021-06-07 | Discharge: 2021-06-07 | Disposition: A | Discharge status: Home / Self Care | Payer: Medicare Other | Source: Ambulatory Visit | Attending: Internal Medicine | Admitting: Internal Medicine

## 2021-06-07 ENCOUNTER — Other Ambulatory Visit: Payer: Self-pay

## 2021-06-07 ENCOUNTER — Ambulatory Visit
Admission: RE | Admit: 2021-06-07 | Discharge: 2021-06-07 | Disposition: A | Discharge status: Home / Self Care | Payer: Medicare Other | Source: Ambulatory Visit | Attending: Thoracic Surgery (Cardiothoracic Vascular Surgery) | Admitting: Thoracic Surgery (Cardiothoracic Vascular Surgery)

## 2021-06-07 ENCOUNTER — Other Ambulatory Visit: Payer: Self-pay | Admitting: *Deleted

## 2021-06-07 ENCOUNTER — Other Ambulatory Visit: Payer: Self-pay | Admitting: Physician Assistant

## 2021-06-07 ENCOUNTER — Institutional Professional Consult (permissible substitution) (INDEPENDENT_AMBULATORY_CARE_PROVIDER_SITE_OTHER): Payer: Medicare Other | Admitting: Thoracic Surgery (Cardiothoracic Vascular Surgery)

## 2021-06-07 VITALS — BP 170/78 | HR 82 | Resp 20 | Ht 60.0 in | Wt 171.0 lb

## 2021-06-07 DIAGNOSIS — C799 Secondary malignant neoplasm of unspecified site: Secondary | ICD-10-CM

## 2021-06-07 DIAGNOSIS — J90 Pleural effusion, not elsewhere classified: Secondary | ICD-10-CM

## 2021-06-07 DIAGNOSIS — I700 Atherosclerosis of aorta: Secondary | ICD-10-CM | POA: Diagnosis not present

## 2021-06-07 DIAGNOSIS — C7951 Secondary malignant neoplasm of bone: Secondary | ICD-10-CM | POA: Diagnosis not present

## 2021-06-07 DIAGNOSIS — R918 Other nonspecific abnormal finding of lung field: Secondary | ICD-10-CM | POA: Diagnosis not present

## 2021-06-07 DIAGNOSIS — C3490 Malignant neoplasm of unspecified part of unspecified bronchus or lung: Secondary | ICD-10-CM | POA: Diagnosis not present

## 2021-06-07 DIAGNOSIS — I2510 Atherosclerotic heart disease of native coronary artery without angina pectoris: Secondary | ICD-10-CM | POA: Diagnosis not present

## 2021-06-07 DIAGNOSIS — I7789 Other specified disorders of arteries and arterioles: Secondary | ICD-10-CM | POA: Diagnosis not present

## 2021-06-07 DIAGNOSIS — C801 Malignant (primary) neoplasm, unspecified: Secondary | ICD-10-CM | POA: Diagnosis not present

## 2021-06-07 LAB — GLUCOSE, CAPILLARY: Glucose-Capillary: 121 mg/dL — ABNORMAL HIGH (ref 70–99)

## 2021-06-07 MED ORDER — FLUDEOXYGLUCOSE F - 18 (FDG) INJECTION
8.7700 | Freq: Once | INTRAVENOUS | Status: AC | PRN
  Administered 2021-06-07: 8.77 via INTRAVENOUS

## 2021-06-07 NOTE — Progress Notes (Signed)
PCP is [name], MD Referring Provider is Curt Bears, MD  Chief Complaint  Patient presents with   Pleural Effusion    Surgical consult with CXR, HX of THORACENTESIS 05/04/21    HPI: [name] is sent for consultation regarding recurrent left pleural effusion.  [name] is an 80 year old woman with a complex medical history including hypertension, hyperlipidemia, atrial fibrillation, class III congestive heart failure, COPD, cor pulmonale, gout, asthma, angiodysplasia, monoclonal gammopathy, obstructive sleep apnea, and stage IV lung cancer.  She presented with worsening shortness of breath in October.  She was found to have a large left pleural effusion.  She had a thoracentesis which drained 850 mL of fluid.  Cytology was positive for metastatic adenocarcinoma.  She had relief for about a week but then became more dyspneic.  She has been on home oxygen with 2 L nasal cannula for about 6 years she says.  She has not tried increasing her oxygen.   Past Medical History:  Diagnosis Date   Allergy    Angiodysplasia of cecum    Angiodysplasia of duodenum    Arthritis    Asteatotic eczema 02/25/2016   Asthma    Atrial fibrillation ([place]) 10/17/2017   Blood transfusion without reported diagnosis    Cataract    CHF (congestive heart failure), NYHA class III, chronic, combined ([place]) 08/10/2017   Colonic polyp 02/16/2008   Tubular adenoma   COPD (chronic obstructive pulmonary disease) ([place]) 01/27/98   Cor pulmonale ([place]) 11/16/2014   GERD 07/30/1992   Gout    HTN (hypertension) 07/30/1988   Hyperlipidemia with target LDL less than 100 11/27/1996        Kidney stone 1960, 1972, 1991   MGUS (monoclonal gammopathy of unknown significance) 11/08/2016   Morbid obesity ([place]) 04/19/2010   She agrees to work on her lifestyle modifications to help her lose weight.    OSA (obstructive sleep apnea) 06/22/2013   Osteopenia, senile 02/05/2013   July 2014  -2.1 left femur -1.9 left forearm     Prediabetes 11/13/2006   Renal insufficiency    Stenosis of cervical spine with myelopathy (HCC) 01/01/2018   Ulcer of leg, chronic, right ([place]) 10/10/2011   ULCERATIVE COLITIS-LEFT SIDE 03/19/2008        Vitamin B12 deficiency anemia 11/13/2006        Vitamin D deficiency 02/06/2013    Past Surgical History:  Procedure Laterality Date   BRONCHOSCOPY     COLONOSCOPY     COLONOSCOPY WITH PROPOFOL N/A 02/23/2015   Procedure: COLONOSCOPY WITH PROPOFOL;  Surgeon: Ladene Artist, MD;  Location: [facility];  Service: Endoscopy;  Laterality: N/A;   COLONOSCOPY WITH PROPOFOL N/A 08/12/2018   Procedure: COLONOSCOPY WITH PROPOFOL;  Surgeon: Ladene Artist, MD;  [facility];  Service: Endoscopy;  Laterality: N/A;   COLONOSCOPY WITH PROPOFOL N/A 05/09/2020   Procedure: COLONOSCOPY WITH PROPOFOL;  Surgeon: Ladene Artist, MD;  [facility];  Service: Endoscopy;  Laterality: N/A;  To Splenic Flexture   ESOPHAGOGASTRODUODENOSCOPY (EGD) WITH PROPOFOL N/A 02/23/2015   Procedure: ESOPHAGOGASTRODUODENOSCOPY (EGD) WITH PROPOFOL;  Surgeon: Ladene Artist, MD;  [facility];  Service: Endoscopy;  Laterality: N/A;   ESOPHAGOGASTRODUODENOSCOPY (EGD) WITH PROPOFOL N/A 08/12/2018   Procedure: ESOPHAGOGASTRODUODENOSCOPY (EGD) WITH PROPOFOL;  Surgeon: Ladene Artist, MD;  [facility];  Service: Endoscopy;  Laterality: N/A;   ESOPHAGOGASTRODUODENOSCOPY (EGD) WITH PROPOFOL N/A 05/08/2020   Procedure: ESOPHAGOGASTRODUODENOSCOPY (EGD) WITH PROPOFOL;  Surgeon: Yetta Flock, MD;  [facility];  Service: Gastroenterology;  Laterality: N/A;   HOT HEMOSTASIS N/A 08/12/2018   Procedure: HOT HEMOSTASIS (ARGON PLASMA COAGULATION/BICAP);  Surgeon: Ladene Artist, MD;  Location: [facility];  Service: Endoscopy;  Laterality: N/A;  EGD and Colon APC   HOT HEMOSTASIS N/A 05/08/2020   Procedure: HOT HEMOSTASIS (ARGON PLASMA COAGULATION/BICAP);  Surgeon: Yetta Flock, MD;  Location: [facility];  Service: Gastroenterology;  Laterality: N/A;   LAPAROTOMY N/A 08/11/2020   Procedure: EXPLORATORY LAPAROTOMY PARTIAL COLECTOMY AND COLOSTOMY WITH RESECTION OF A MESSENTERIC MASS;  Surgeon: Coralie Keens, MD;  [facility]: [facility];  Service: General;  Laterality: N/A;   POLYPECTOMY      Family History  Problem Relation Age of Onset   Stroke Mother    Diabetes Mother    Kidney cancer Mother    Stomach cancer Mother    Heart disease Mother    Stroke Father    Heart disease Brother    Heart disease Brother    Crohn's disease Brother    Heart disease Sister        CATH,STENT   Clotting disorder Sister    Cervical cancer Sister    Lung cancer Sister    Heart attack Sister    Diabetes Sister    Colon cancer Neg Hx    Esophageal cancer Neg Hx    Rectal cancer Neg Hx     Social History Social History   Tobacco Use   Smoking status: Former    Types: Cigarettes    Quit date: 06/01/1996    Years since quitting: 25.0   Smokeless tobacco: Never   Tobacco comments:    Quit 1998  Vaping Use   Vaping Use: Never used  Substance Use Topics   Alcohol use: No   Drug use: No    Current Outpatient Medications  Medication Sig Dispense Refill   acetaminophen (TYLENOL) 325 MG tablet Take 325-650 mg by mouth daily as needed for moderate pain, fever or headache.      albuterol (PROAIR HFA) 108 (90 Base) MCG/ACT inhaler Inhale 1-2 puffs into the lungs every 6 (six) hours as needed for wheezing or shortness of breath. 18 g 11   allopurinol (ZYLOPRIM) 100 MG tablet TAKE 1 TABLET(100 MG) BY MOUTH DAILY (Patient taking differently: Take 100 mg by mouth daily.) 90 tablet 1   clonazePAM (KLONOPIN) 1 MG tablet Take 1 tablet (1 mg total) by mouth 2 (two) times daily as needed for anxiety. 180 tablet 0   ferrous sulfate 325 (65 FE) MG tablet Take 1 tablet (325 mg total) by mouth 2 (two) times daily with a meal. 180 tablet 1   Fluticasone-Umeclidin-Vilant (TRELEGY  ELLIPTA) 100-62.5-25 MCG/INH AEPB Inhale 1 puff into the lungs daily. 120 each 1   guaiFENesin-dextromethorphan (ROBITUSSIN DM) 100-10 MG/5ML syrup Take 5 mLs by mouth every 4 (four) hours as needed for cough (chest congestion). 118 mL 0   isosorbide-hydrALAZINE (BIDIL) 20-37.5 MG tablet TAKE 1 TABLET BY MOUTH THREE TIMES DAILY 270 tablet 0   metoprolol tartrate (LOPRESSOR) 25 MG tablet TAKE 1 TABLET(25 MG) BY MOUTH TWICE DAILY (Patient taking differently: Take 12.5 mg by mouth 2 (two) times daily.) 180 tablet 1   morphine (MS CONTIN) 15 MG 12 hr tablet Take 1 tablet (15 mg total) by mouth 2 (two) times daily as needed for pain. 60 tablet 0   torsemide (DEMADEX) 20 MG tablet TAKE 2 TABLETS(40 MG) BY MOUTH EVERY MORNING 180 tablet 0   Current  Facility-Administered Medications  Medication Dose Route Frequency Provider Last Rate Last Admin   cyanocobalamin ((VITAMIN B-12)) injection 1,000 mcg  1,000 mcg Intramuscular Q30 days [name], MD   1,000 mcg at 01/31/21 1507    Allergies  Allergen Reactions   Celebrex [Celecoxib]     edema   Amlodipine Besylate Other (See Comments)    REACTION: edema   Enalapril Cough   Lipitor [Atorvastatin]     Muscle aches   Amoxicillin Diarrhea    DID THE REACTION INVOLVE: Swelling of the face/tongue/throat, SOB, or low BP? N Sudden or severe rash/hives, skin peeling, or the inside of the mouth or nose? N Did it require medical treatment? N When did it last happen?      MORE THAN 10 YEARS If all above answers are "NO", may proceed with cephalosporin use.    Codeine Sulfate Nausea Only   Hydrocodone-Acetaminophen Nausea Only    Review of Systems  Constitutional:  Positive for activity change, appetite change, fatigue and unexpected weight change.  HENT:  Positive for trouble swallowing and voice change.   Respiratory:  Positive for cough, shortness of breath and wheezing.   Gastrointestinal:  Positive for abdominal pain and blood in stool.   Genitourinary:  Negative for difficulty urinating and dysuria.  Musculoskeletal:  Positive for arthralgias, joint swelling and myalgias.  Neurological:  Positive for numbness.  Hematological:  Does not bruise/bleed easily.  Psychiatric/Behavioral:  Positive for dysphoric mood. The patient is nervous/anxious.    BP (!) 170/78   Pulse 82   Resp 20   Ht 5' (1.524 m)   Wt 171 lb (77.6 kg)   SpO2 (!) 88% Comment: 2L O2 per [place]  BMI 33.40 kg/m  Physical Exam Vitals reviewed.  Constitutional:      Appearance: She is obese. She is ill-appearing.  HENT:     Head: Normocephalic and atraumatic.     Nose:     Comments: Nasal cannula in place Cardiovascular:     Rate and Rhythm: Normal rate and regular rhythm.     Heart sounds: Murmur heard.  Pulmonary:     Effort: No respiratory distress.     Comments: Absent breath sounds lower two thirds left chest, right clear Abdominal:     General: There is no distension.     Palpations: Abdomen is soft.  Musculoskeletal:     Cervical back: Neck supple.  Lymphadenopathy:     Cervical: No cervical adenopathy.  Skin:    General: Skin is warm and dry.  Neurological:     General: No focal deficit present.     Mental Status: She is alert and oriented to person, place, and time.     Diagnostic Tests: I personally reviewed her chest x-ray.  She has a large left pleural effusion.  Impression: [name] is an 80 year old woman with a complex medical history including hypertension, hyperlipidemia, atrial fibrillation, class III congestive heart failure, COPD, cor pulmonale, gout, asthma, angiodysplasia, monoclonal gammopathy, obstructive sleep apnea, and stage IV lung cancer.   She presented in October with worsening dyspnea.  She was found to have a large left pleural effusion.  Thoracentesis drained 850 mL of fluid.  Cytology was positive for metastatic adenocarcinoma.  She had very short-term temporary relief for about a week.    Malignant  pleural effusion-had a relatively rapid recurrence.  Now has a large pleural effusion.  Think the best option for her from the standpoint of palliation would be to place a  pleural catheter to keep the effusion drained.  Unfortunately cannot do that until next Thursday.  I offered her a temporizing thoracentesis to help her symptomatically in the meantime but she prefers to wait.  I described the proposed procedure to her and her granddaughter who helps take care of her.  They understand she will have a chronic indwelling catheter that will require ongoing care.  They understand we will do this in the main operating room with local anesthesia and intravenous sedation.  We will plan to do it on an outpatient basis.  I informed them of the indications, risks, benefits, and alternatives.  They understand the risks include, but not are limited to bleeding, infection, catheter occlusion, as well as the possibility of other unforeseeable complications.  Plan: Left pleural catheter placement on Thursday, 06/15/2021  [name], MD [facility] [phone]

## 2021-06-07 NOTE — H&P (View-Only) (Signed)
PCP is [name], MD Referring Provider is Curt Bears, MD  Chief Complaint  Patient presents with   Pleural Effusion    Surgical consult with CXR, HX of THORACENTESIS 05/04/21    HPI: Mrs. Exline is sent for consultation regarding recurrent left pleural effusion.  Body Stoecker is an 80 year old woman with a complex medical history including hypertension, hyperlipidemia, atrial fibrillation, class III congestive heart failure, COPD, cor pulmonale, gout, asthma, angiodysplasia, monoclonal gammopathy, obstructive sleep apnea, and stage IV lung cancer.  She presented with worsening shortness of breath in October.  She was found to have a large left pleural effusion.  She had a thoracentesis which drained 850 mL of fluid.  Cytology was positive for metastatic adenocarcinoma.  She had relief for about a week but then became more dyspneic.  She has been on home oxygen with 2 L nasal cannula for about 6 years she says.  She has not tried increasing her oxygen.   Past Medical History:  Diagnosis Date   Allergy    Angiodysplasia of cecum    Angiodysplasia of duodenum    Arthritis    Asteatotic eczema 02/25/2016   Asthma    Atrial fibrillation ([place]) 10/17/2017   Blood transfusion without reported diagnosis    Cataract    CHF (congestive heart failure), NYHA class III, chronic, combined ([place]) 08/10/2017   Colonic polyp 02/16/2008   Tubular adenoma   COPD (chronic obstructive pulmonary disease) ([place]) 01/27/98   Cor pulmonale ([place]) 11/16/2014   GERD 07/30/1992   Gout    HTN (hypertension) 07/30/1988   Hyperlipidemia with target LDL less than 100 11/27/1996        Kidney stone 1960, 1972, 1991   MGUS (monoclonal gammopathy of unknown significance) 11/08/2016   Morbid obesity ([place]) 04/19/2010   She agrees to work on her lifestyle modifications to help her lose weight.    OSA (obstructive sleep apnea) 06/22/2013   Osteopenia, senile 02/05/2013   July 2014  -2.1 left femur -1.9 left forearm     Prediabetes 11/13/2006   Renal insufficiency    Stenosis of cervical spine with myelopathy (HCC) 01/01/2018   Ulcer of leg, chronic, right ([place]) 10/10/2011   ULCERATIVE COLITIS-LEFT SIDE 03/19/2008        Vitamin B12 deficiency anemia 11/13/2006        Vitamin D deficiency 02/06/2013    Past Surgical History:  Procedure Laterality Date   BRONCHOSCOPY     COLONOSCOPY     COLONOSCOPY WITH PROPOFOL N/A 02/23/2015   Procedure: COLONOSCOPY WITH PROPOFOL;  Surgeon: Ladene Artist, MD;  [facility];  Service: Endoscopy;  Laterality: N/A;   COLONOSCOPY WITH PROPOFOL N/A 08/12/2018   Procedure: COLONOSCOPY WITH PROPOFOL;  Surgeon: Ladene Artist, MD;  [facility];  Service: Endoscopy;  Laterality: N/A;   COLONOSCOPY WITH PROPOFOL N/A 05/09/2020   Procedure: COLONOSCOPY WITH PROPOFOL;  Surgeon: Ladene Artist, MD;  [facility];  Service: Endoscopy;  Laterality: N/A;  To Splenic Flexture   ESOPHAGOGASTRODUODENOSCOPY (EGD) WITH PROPOFOL N/A 02/23/2015   Procedure: ESOPHAGOGASTRODUODENOSCOPY (EGD) WITH PROPOFOL;  Surgeon: Ladene Artist, MD;  [facility];  Service: Endoscopy;  Laterality: N/A;   ESOPHAGOGASTRODUODENOSCOPY (EGD) WITH PROPOFOL N/A 08/12/2018   Procedure: ESOPHAGOGASTRODUODENOSCOPY (EGD) WITH PROPOFOL;  Surgeon: Ladene Artist, MD;  [facility];  Service: Endoscopy;  Laterality: N/A;   ESOPHAGOGASTRODUODENOSCOPY (EGD) WITH PROPOFOL N/A 05/08/2020   Procedure: ESOPHAGOGASTRODUODENOSCOPY (EGD) WITH PROPOFOL;  Surgeon: Yetta Flock, MD;  [facility];  Service: Gastroenterology;  Laterality: N/A;   HOT HEMOSTASIS N/A 08/12/2018   Procedure: HOT HEMOSTASIS (ARGON PLASMA COAGULATION/BICAP);  Surgeon: Ladene Artist, MD;  Location: [facility];  Service: Endoscopy;  Laterality: N/A;  EGD and Colon APC   HOT HEMOSTASIS N/A 05/08/2020   Procedure: HOT HEMOSTASIS (ARGON PLASMA COAGULATION/BICAP);  Surgeon: Yetta Flock, MD;  Location: [facility];  Service: Gastroenterology;  Laterality: N/A;   LAPAROTOMY N/A 08/11/2020   Procedure: EXPLORATORY LAPAROTOMY PARTIAL COLECTOMY AND COLOSTOMY WITH RESECTION OF A MESSENTERIC MASS;  Surgeon: Coralie Keens, MD;  [facility];  Service: General;  Laterality: N/A;   POLYPECTOMY      Family History  Problem Relation Age of Onset   Stroke Mother    Diabetes Mother    Kidney cancer Mother    Stomach cancer Mother    Heart disease Mother    Stroke Father    Heart disease Brother    Heart disease Brother    Crohn's disease Brother    Heart disease Sister        CATH,STENT   Clotting disorder Sister    Cervical cancer Sister    Lung cancer Sister    Heart attack Sister    Diabetes Sister    Colon cancer Neg Hx    Esophageal cancer Neg Hx    Rectal cancer Neg Hx     Social History Social History   Tobacco Use   Smoking status: Former    Types: Cigarettes    Quit date: 06/01/1996    Years since quitting: 25.0   Smokeless tobacco: Never   Tobacco comments:    Quit 1998  Vaping Use   Vaping Use: Never used  Substance Use Topics   Alcohol use: No   Drug use: No    Current Outpatient Medications  Medication Sig Dispense Refill   acetaminophen (TYLENOL) 325 MG tablet Take 325-650 mg by mouth daily as needed for moderate pain, fever or headache.      albuterol (PROAIR HFA) 108 (90 Base) MCG/ACT inhaler Inhale 1-2 puffs into the lungs every 6 (six) hours as needed for wheezing or shortness of breath. 18 g 11   allopurinol (ZYLOPRIM) 100 MG tablet TAKE 1 TABLET(100 MG) BY MOUTH DAILY (Patient taking differently: Take 100 mg by mouth daily.) 90 tablet 1   clonazePAM (KLONOPIN) 1 MG tablet Take 1 tablet (1 mg total) by mouth 2 (two) times daily as needed for anxiety. 180 tablet 0   ferrous sulfate 325 (65 FE) MG tablet Take 1 tablet (325 mg total) by mouth 2 (two) times daily with a meal. 180 tablet 1   Fluticasone-Umeclidin-Vilant (TRELEGY  ELLIPTA) 100-62.5-25 MCG/INH AEPB Inhale 1 puff into the lungs daily. 120 each 1   guaiFENesin-dextromethorphan (ROBITUSSIN DM) 100-10 MG/5ML syrup Take 5 mLs by mouth every 4 (four) hours as needed for cough (chest congestion). 118 mL 0   isosorbide-hydrALAZINE (BIDIL) 20-37.5 MG tablet TAKE 1 TABLET BY MOUTH THREE TIMES DAILY 270 tablet 0   metoprolol tartrate (LOPRESSOR) 25 MG tablet TAKE 1 TABLET(25 MG) BY MOUTH TWICE DAILY (Patient taking differently: Take 12.5 mg by mouth 2 (two) times daily.) 180 tablet 1   morphine (MS CONTIN) 15 MG 12 hr tablet Take 1 tablet (15 mg total) by mouth 2 (two) times daily as needed for pain. 60 tablet 0   torsemide (DEMADEX) 20 MG tablet TAKE 2 TABLETS(40 MG) BY MOUTH EVERY MORNING 180 tablet 0   Current  Facility-Administered Medications  Medication Dose Route Frequency Provider Last Rate Last Admin   cyanocobalamin ((VITAMIN B-12)) injection 1,000 mcg  1,000 mcg Intramuscular Q30 days [name], MD   1,000 mcg at 01/31/21 1507    Allergies  Allergen Reactions   Celebrex [Celecoxib]     edema   Amlodipine Besylate Other (See Comments)    REACTION: edema   Enalapril Cough   Lipitor [Atorvastatin]     Muscle aches   Amoxicillin Diarrhea    DID THE REACTION INVOLVE: Swelling of the face/tongue/throat, SOB, or low BP? N Sudden or severe rash/hives, skin peeling, or the inside of the mouth or nose? N Did it require medical treatment? N When did it last happen?      MORE THAN 10 YEARS If all above answers are "NO", may proceed with cephalosporin use.    Codeine Sulfate Nausea Only   Hydrocodone-Acetaminophen Nausea Only    Review of Systems  Constitutional:  Positive for activity change, appetite change, fatigue and unexpected weight change.  HENT:  Positive for trouble swallowing and voice change.   Respiratory:  Positive for cough, shortness of breath and wheezing.   Gastrointestinal:  Positive for abdominal pain and blood in stool.   Genitourinary:  Negative for difficulty urinating and dysuria.  Musculoskeletal:  Positive for arthralgias, joint swelling and myalgias.  Neurological:  Positive for numbness.  Hematological:  Does not bruise/bleed easily.  Psychiatric/Behavioral:  Positive for dysphoric mood. The patient is nervous/anxious.    BP (!) 170/78   Pulse 82   Resp 20   Ht 5' (1.524 m)   Wt 171 lb (77.6 kg)   SpO2 (!) 88% Comment: 2L O2 per [place]  BMI 33.40 kg/m  Physical Exam Vitals reviewed.  Constitutional:      Appearance: She is obese. She is ill-appearing.  HENT:     Head: Normocephalic and atraumatic.     Nose:     Comments: Nasal cannula in place Cardiovascular:     Rate and Rhythm: Normal rate and regular rhythm.     Heart sounds: Murmur heard.  Pulmonary:     Effort: No respiratory distress.     Comments: Absent breath sounds lower two thirds left chest, right clear Abdominal:     General: There is no distension.     Palpations: Abdomen is soft.  Musculoskeletal:     Cervical back: Neck supple.  Lymphadenopathy:     Cervical: No cervical adenopathy.  Skin:    General: Skin is warm and dry.  Neurological:     General: No focal deficit present.     Mental Status: She is alert and oriented to person, place, and time.     Diagnostic Tests: I personally reviewed her chest x-ray.  She has a large left pleural effusion.  Impression: Archie Atilano is an 80 year old woman with a complex medical history including hypertension, hyperlipidemia, atrial fibrillation, class III congestive heart failure, COPD, cor pulmonale, gout, asthma, angiodysplasia, monoclonal gammopathy, obstructive sleep apnea, and stage IV lung cancer.   She presented in October with worsening dyspnea.  She was found to have a large left pleural effusion.  Thoracentesis drained 850 mL of fluid.  Cytology was positive for metastatic adenocarcinoma.  She had very short-term temporary relief for about a week.    Malignant  pleural effusion-had a relatively rapid recurrence.  Now has a large pleural effusion.  Think the best option for her from the standpoint of palliation would be to place a  pleural catheter to keep the effusion drained.  Unfortunately cannot do that until next Thursday.  I offered her a temporizing thoracentesis to help her symptomatically in the meantime but she prefers to wait.  I described the proposed procedure to her and her granddaughter who helps take care of her.  They understand she will have a chronic indwelling catheter that will require ongoing care.  They understand we will do this in the main operating room with local anesthesia and intravenous sedation.  We will plan to do it on an outpatient basis.  I informed them of the indications, risks, benefits, and alternatives.  They understand the risks include, but not are limited to bleeding, infection, catheter occlusion, as well as the possibility of other unforeseeable complications.  Plan: Left pleural catheter placement on Thursday, 06/15/2021  [name], MD [facility] [phone]

## 2021-06-08 ENCOUNTER — Inpatient Hospital Stay: Payer: Medicare Other

## 2021-06-08 ENCOUNTER — Other Ambulatory Visit: Payer: Self-pay

## 2021-06-08 ENCOUNTER — Ambulatory Visit (HOSPITAL_COMMUNITY)
Admission: RE | Admit: 2021-06-08 | Discharge: 2021-06-08 | Disposition: A | Discharge status: Home / Self Care | Payer: Medicare Other | Source: Ambulatory Visit | Attending: Physician Assistant | Admitting: Physician Assistant

## 2021-06-08 ENCOUNTER — Telehealth: Payer: Self-pay | Admitting: Pharmacist

## 2021-06-08 ENCOUNTER — Other Ambulatory Visit: Payer: Self-pay | Admitting: Internal Medicine

## 2021-06-08 ENCOUNTER — Other Ambulatory Visit (HOSPITAL_COMMUNITY): Payer: Self-pay

## 2021-06-08 ENCOUNTER — Encounter: Payer: Self-pay | Admitting: Internal Medicine

## 2021-06-08 ENCOUNTER — Other Ambulatory Visit: Payer: Self-pay | Admitting: Physician Assistant

## 2021-06-08 ENCOUNTER — Inpatient Hospital Stay: Payer: Medicare Other | Attending: Internal Medicine | Admitting: Physician Assistant

## 2021-06-08 ENCOUNTER — Ambulatory Visit (HOSPITAL_COMMUNITY)
Admission: RE | Admit: 2021-06-08 | Discharge: 2021-06-08 | Disposition: A | Discharge status: Home / Self Care | Payer: Medicare Other | Source: Ambulatory Visit | Attending: Internal Medicine | Admitting: Internal Medicine

## 2021-06-08 ENCOUNTER — Other Ambulatory Visit (HOSPITAL_COMMUNITY): Payer: Self-pay | Admitting: Internal Medicine

## 2021-06-08 VITALS — BP 113/68 | HR 77 | Temp 98.1°F | Resp 18 | Ht 60.0 in

## 2021-06-08 DIAGNOSIS — Z48813 Encounter for surgical aftercare following surgery on the respiratory system: Secondary | ICD-10-CM | POA: Diagnosis not present

## 2021-06-08 DIAGNOSIS — C799 Secondary malignant neoplasm of unspecified site: Secondary | ICD-10-CM | POA: Diagnosis not present

## 2021-06-08 DIAGNOSIS — Z5111 Encounter for antineoplastic chemotherapy: Secondary | ICD-10-CM

## 2021-06-08 DIAGNOSIS — C3492 Malignant neoplasm of unspecified part of left bronchus or lung: Secondary | ICD-10-CM | POA: Diagnosis not present

## 2021-06-08 DIAGNOSIS — Z9889 Other specified postprocedural states: Secondary | ICD-10-CM

## 2021-06-08 DIAGNOSIS — J90 Pleural effusion, not elsewhere classified: Secondary | ICD-10-CM | POA: Insufficient documentation

## 2021-06-08 LAB — CBC WITH DIFFERENTIAL (CANCER CENTER ONLY)
Abs Immature Granulocytes: 0.04 10*3/uL (ref 0.00–0.07)
Basophils Absolute: 0 10*3/uL (ref 0.0–0.1)
Basophils Relative: 0 %
Eosinophils Absolute: 0.2 10*3/uL (ref 0.0–0.5)
Eosinophils Relative: 2 %
HCT: 32.8 % — ABNORMAL LOW (ref 36.0–46.0)
Hemoglobin: 10 g/dL — ABNORMAL LOW (ref 12.0–15.0)
Immature Granulocytes: 0 %
Lymphocytes Relative: 7 %
Lymphs Abs: 0.7 10*3/uL (ref 0.7–4.0)
MCH: 25.4 pg — ABNORMAL LOW (ref 26.0–34.0)
MCHC: 30.5 g/dL (ref 30.0–36.0)
MCV: 83.2 fL (ref 80.0–100.0)
Monocytes Absolute: 0.8 10*3/uL (ref 0.1–1.0)
Monocytes Relative: 8 %
Neutro Abs: 8.7 10*3/uL — ABNORMAL HIGH (ref 1.7–7.7)
Neutrophils Relative %: 83 %
Platelet Count: 220 10*3/uL (ref 150–400)
RBC: 3.94 MIL/uL (ref 3.87–5.11)
RDW: 14.5 % (ref 11.5–15.5)
WBC Count: 10.5 10*3/uL (ref 4.0–10.5)
nRBC: 0 % (ref 0.0–0.2)

## 2021-06-08 LAB — CMP (CANCER CENTER ONLY)
ALT: 6 U/L (ref 0–44)
AST: 13 U/L — ABNORMAL LOW (ref 15–41)
Albumin: 3.3 g/dL — ABNORMAL LOW (ref 3.5–5.0)
Alkaline Phosphatase: 60 U/L (ref 38–126)
Anion gap: 10 (ref 5–15)
BUN: 40 mg/dL — ABNORMAL HIGH (ref 8–23)
CO2: 32 mmol/L (ref 22–32)
Calcium: 10.2 mg/dL (ref 8.9–10.3)
Chloride: 98 mmol/L (ref 98–111)
Creatinine: 1.37 mg/dL — ABNORMAL HIGH (ref 0.44–1.00)
GFR, Estimated: 39 mL/min — ABNORMAL LOW (ref >60)
Glucose, Bld: 108 mg/dL — ABNORMAL HIGH (ref 70–99)
Potassium: 4.2 mmol/L (ref 3.5–5.1)
Sodium: 140 mmol/L (ref 135–145)
Total Bilirubin: 0.3 mg/dL (ref 0.3–1.2)
Total Protein: 7.4 g/dL (ref 6.5–8.1)

## 2021-06-08 MED ORDER — PROCHLORPERAZINE MALEATE 10 MG PO TABS: 10.0000 mg | ORAL_TABLET | Freq: Four times a day (QID) | ORAL | 2 refills | Status: AC | PRN

## 2021-06-08 MED ORDER — OSIMERTINIB MESYLATE 80 MG PO TABS
80.0000 mg | ORAL_TABLET | Freq: Every day | ORAL | 3 refills | Status: DC
  Filled 2021-06-08: qty 30, 30d supply, fill #0

## 2021-06-08 MED ORDER — LIDOCAINE HCL 1 % IJ SOLN
INTRAMUSCULAR | Status: AC
  Filled 2021-06-08: qty 20

## 2021-06-08 NOTE — Telephone Encounter (Signed)
Oral Oncology Pharmacist Encounter  Received new prescription for Tagrisso (osimertinib) for the treatment of metastatic non-small cell lung cancer, exon 21 [id number] mutation positive, planned duration until disease progression or unacceptable drug toxicity.  CBC w/ Diff and CMP from 06/08/21 assessed, noted Scr of 1.37 (CrCl ~40 mL/min) - no baseline dose adjustments required. Noted EKG from 06/08/21 with QtcB of 491m. Prescription dose and frequency assessed for appropriateness. Appropriate for therapy initiation.   Current medication list in Epic reviewed, DDIs with Tagrisso identified: Category C DDI between [place] - Tagrisso may increase morphine serum concentrations may be increased through PgP/ABCB1 inhibition. No dose adjustments required at this time. Patient and patient's granddaughter are aware to alert office if there is noted increase in sedation/lethargy.   Evaluated chart and no patient barriers to medication adherence noted.   Patient agreement for treatment documented in MD note on 06/08/21.  Received notification from [employer] that prior authorization for Tagrisso is required.   PA submitted on CoverMyMeds Key: [id number] is approved Patient's copay is: $2485.93 Given high copay will proceed with applying for manufacturer assistance at this time.   Oral Oncology Clinic will continue to follow.   RLeron Croak PharmD, BCPS Hematology/Oncology Clinical Pharmacist [id number]/04/2021 11:40 AM

## 2021-06-08 NOTE — Telephone Encounter (Addendum)
Oral Chemotherapy Pharmacist Encounter  I met with patient and patient's granddaughter, Ander Purpura, for overview of: Tagrisso for the treatment of metastatic, non-small cell lung cancer, exon 21 L858R mutation positive, planned duration until disease progression or unacceptable drug toxicity.  Counseled patient on administration, dosing, side effects, monitoring, drug-food interactions, safe handling, storage, and disposal.  Patient will take Tagrisso 80 tablets, 1 tablet by mouth once daily, without regard to food.  Patient knows to avoid grapefruit or grapefruit juice while on Tagrisso.   Tagrisso start date: ~06/17/21 (patient will start once received from manufacturer, approved for manufacturer assistance as of 06/13/21)  Adverse effects include but are not limited to: diarrhea, mouth sores, decreased appetitie, fatigue, dry skin, rash, nail changes, altered cardiac conduction, and decreased blood counts or electrolytes.   Patient will obtain anti diarrheal and alert the office of 4 or more loose stools above baseline.   Reviewed with patient importance of keeping a medication schedule and plan for any missed doses. No barriers to medication adherence identified.  Medication reconciliation performed and medication/allergy list updated.  All questions answered.  Ms. Pollick voiced understanding and appreciation.   Medication education handout given to patient. Patient knows to call the office with questions or concerns. Oral Chemotherapy Clinic phone number provided to patient.   Leron Croak, PharmD, BCPS Hematology/Oncology Clinical Pharmacist [facility] [place] [phone] 06/08/2021 11:59 AM

## 2021-06-08 NOTE — Procedures (Signed)
PROCEDURE SUMMARY:  Successful US guided therapeutic left thoracentesis. Yielded 700 cc of clear, yellow fluid. Pt tolerated procedure well. No immediate complications.  Specimen not sent for labs. CXR ordered.  EBL < 1 mL  Tyson Alias, AGNP 06/08/2021 2:07 PM

## 2021-06-08 NOTE — Progress Notes (Signed)
START ON PATHWAY REGIMEN - Non-Small Cell Lung     A cycle is every 28 days:     Osimertinib   **Always confirm dose/schedule in your pharmacy ordering system**  Patient Characteristics: Stage IV Metastatic, Nonsquamous, Molecular Analysis Completed, Molecular Alteration Present and Eligible for Molecular Targeted Therapy, Initial Molecular Targeted Therapy, EGFR Mutation - Common (Exon 19 Deletion or Exon 21 L858R Substitution) Therapeutic Status: Stage IV Metastatic Histology: Nonsquamous Cell Broad Molecular Profiling Status: Molecular Analysis Completed Molecular Analysis Results: Alteration Present and Eligible for Molecular Targeted Therapy Molecular Alteration Present: EGFR Mutation - Common (Exon 19 Deletion or Exon 21 L858R Substitution) Molecular Targeted Line of Therapy: Initial Molecular Targeted Therapy Intent of Therapy: Non-Curative / Palliative Intent, Discussed with Patient 

## 2021-06-09 ENCOUNTER — Encounter: Payer: Self-pay | Admitting: Internal Medicine

## 2021-06-12 NOTE — Progress Notes (Signed)
Histology and Location of Primary Cancer: Non small cell lung cancer- Left, adenocarcinoma, widespread metastatic disease in the thoracic nodes, left pleural space, abdomen pelvic lymph nodes, and bones.  Location(s) of Symptomatic Metastases: right iliac, left rib  PET 06/07/2021: Osseous and soft tissue metastasis within the right iliac wing measures 2.4 cm.  At least 1 possible left rib metastasis is difficult to differentiate from circumferential pleural hypermetabolism.  MRI Brain 06/02/2021: Solitary punctate abnormal diffusion in the right parietal lobe without enhancement.  This is most likely a recent lacunar infarct (see also #3) which might be clinically silent.  Repeat brain MRI without and with contrast in 3 months could be obtained to document resolution.  Past/Anticipated chemotherapy by medical oncology, if any:  Cassandra Heilingoetter PA/ Dr. Julien Nordmann 06/08/2021 - Dr. Julien Nordmann discussed that she is stage IV and that her condition is treatable but not curable. -Dr. Julien Nordmann she is a candidate for targeted treatment with Tagrisso 80 mg p.o. daily due to her EGFR mutation. The patient is interested in this option and she is expected to start these treatment in the next few days.  -Regarding her pain medication, we discussed with the patient that she cannot cut her MS Contin in half which is why she is likely having side effects.  We will refer the patient to radiation oncology for consideration of palliative radiation to the painful osseous lesions. CURRENT THERAPY:  1) Targeted treatment with Tagrisso 80 mg p.o. daily.  First dose expected in the next few days.  2) referral for palliative radiation    Pain on a scale of 0-10 is: 6/10 pain in her center chest, "on top of my diaphragm".  Since she has generalized pain all over.  She states the pain is in her right hips/left rib most of the time but has pain in her lower stomach when she urinates all the time.    Wears Oxygen 2-3  liters  Ambulatory status? Walker? Wheelchair?: Uses walker and wheelchair.  SAFETY ISSUES: Prior radiation? no Pacemaker/ICD? No Possible current pregnancy? N/a Is the patient on methotrexate? No  Current Complaints / other details:

## 2021-06-12 NOTE — Pre-Procedure Instructions (Signed)
Surgical Instructions    Your procedure is scheduled on Thursday, June 15, 2021 at 2:19 PM.  Report to [facility] Entr[facility] "A" at 12:15 P.M., then check in with the Admitting office.  Call this number if you have problems the morning of surgery:  [phone]   If you have any questions prior to your surgery date call [phone]: Open Monday-Friday 8am-4pm    Remember:  Do not eat after midnight the night before your surgery  You may drink clear liquids until 11:15 AM the morning of your surgery.   Clear liquids allowed are: Water, Non-Citrus Juices (without pulp), Carbonated Beverages, Clear Tea, Black Coffee Only, and Gatorade    Take these medicines the morning of surgery with A SIP OF WATER:  allopurinol (ZYLOPRIM) Fluticasone-Umeclidin-Vilant (TRELEGY ELLIPTA) isosorbide-hydrALAZINE (BIDIL) metoprolol tartrate (LOPRESSOR) osimertinib mesylate (TAGRISSO)  IF NEEDED: acetaminophen (TYLENOL)  albuterol (PROAIR HFA)  clonazePAM (KLONOPIN) morphine (MS CONTIN) prochlorperazine (COMPAZINE)   Please bring all inhalers with you the day of surgery.    As of today, STOP taking any Aspirin (unless otherwise instructed by your surgeon) Aleve, Naproxen, Ibuprofen, Motrin, Advil, Goody's, BC's, all herbal medications, fish oil, and all vitamins.                     Do NOT Smoke (Tobacco/Vaping) or drink Alcohol 24 hours prior to your procedure.  If you use a CPAP at night, you may bring all equipment for your overnight stay.   Contacts, glasses, piercing's, hearing aid's, dentures or partials may not be worn into surgery, please bring cases for these belongings.    For patients admitted to the hospital, discharge time will be determined by your treatment team.   Patients discharged the day of surgery will not be allowed to drive home, and someone needs to stay with them for 24 hours.  NO VISITORS WILL BE ALLOWED IN PRE-OP WHERE PATIENTS GET READY FOR SURGERY.  ONLY 1  SUPPORT PERSON MAY BE PRESENT IN THE WAITING ROOM WHILE YOU ARE IN SURGERY.  IF YOU ARE TO BE ADMITTED, ONCE YOU ARE IN YOUR ROOM YOU WILL BE ALLOWED TWO (2) VISITORS.  Minor children may have two parents present. Special consideration for safety and communication needs will be reviewed on a case by case basis.   Special instructions:   [facility]- Preparing For Surgery  Before surgery, you can play an important role. Because skin is not sterile, your skin needs to be as free of germs as possible. You can reduce the number of germs on your skin by washing with CHG (chlorahexidine gluconate) Soap before surgery.  CHG is an antiseptic cleaner which kills germs and bonds with the skin to continue killing germs even after washing.    Oral Hygiene is also important to reduce your risk of infection.  Remember - BRUSH YOUR TEETH THE MORNING OF SURGERY WITH YOUR REGULAR TOOTHPASTE  Please do not use if you have an allergy to CHG or antibacterial soaps. If your skin becomes reddened/irritated stop using the CHG.  Do not shave (including legs and underarms) for at least 48 hours prior to first CHG shower. It is OK to shave your face.  Please follow these instructions carefully.   Shower the NIGHT BEFORE SURGERY and the MORNING OF SURGERY  If you chose to wash your hair, wash your hair first as usual with your normal shampoo.  After you shampoo, rinse your hair and body thoroughly to remove the shampoo.  Use  CHG Soap as you would any other liquid soap. You can apply CHG directly to the skin and wash gently with a scrungie or a clean washcloth.   Apply the CHG Soap to your body ONLY FROM THE NECK DOWN.  Do not use on open wounds or open sores. Avoid contact with your eyes, ears, mouth and genitals (private parts). Wash Face and genitals (private parts)  with your normal soap.   Wash thoroughly, paying special attention to the area where your surgery will be performed.  Thoroughly rinse your body with  warm water from the neck down.  DO NOT shower/wash with your normal soap after using and rinsing off the CHG Soap.  Pat yourself dry with a CLEAN TOWEL.  Wear CLEAN PAJAMAS to bed the night before surgery  Place CLEAN SHEETS on your bed the night before your surgery  DO NOT SLEEP WITH PETS.   Day of Surgery: Shower with CHG soap. Do not wear jewelry, make up, nail polish, gel polish, artificial nails, or any other type of covering on natural nails including finger and toenails. If patients have artificial nails, gel coating, etc. that need to be removed by a nail salon please have this removed prior to surgery. Surgery may need to be canceled/delayed if the surgeon/ anesthesia feels like the patient is unable to be adequately monitored. Do not wear lotions, powders, perfumes, or deodorant. Do not shave 48 hours prior to surgery. Do not bring valuables to the hospital. [facility] is not responsible for any belongings or valuables. Wear Clean/Comfortable clothing the morning of surgery Remember to brush your teeth WITH YOUR REGULAR TOOTHPASTE.   Please read over the following fact sheets that you were given.

## 2021-06-13 ENCOUNTER — Ambulatory Visit (HOSPITAL_COMMUNITY)
Admission: RE | Admit: 2021-06-13 | Discharge: 2021-06-13 | Disposition: A | Discharge status: Home / Self Care | Payer: Medicare Other | Source: Ambulatory Visit | Attending: Thoracic Surgery (Cardiothoracic Vascular Surgery) | Admitting: Thoracic Surgery (Cardiothoracic Vascular Surgery)

## 2021-06-13 ENCOUNTER — Other Ambulatory Visit (HOSPITAL_COMMUNITY): Payer: Self-pay

## 2021-06-13 ENCOUNTER — Ambulatory Visit
Admission: RE | Admit: 2021-06-13 | Discharge: 2021-06-13 | Disposition: A | Discharge status: Home / Self Care | Payer: Medicare Other | Source: Ambulatory Visit | Attending: Radiation Oncology | Admitting: Radiation Oncology

## 2021-06-13 ENCOUNTER — Encounter: Payer: Self-pay | Admitting: Radiation Oncology

## 2021-06-13 ENCOUNTER — Encounter (HOSPITAL_COMMUNITY): Payer: Self-pay

## 2021-06-13 ENCOUNTER — Ambulatory Visit
Admission: RE | Admit: 2021-06-13 | Discharge: 2021-06-13 | Disposition: A | Discharge status: Home / Self Care | Payer: Medicare Other | Source: Ambulatory Visit | Attending: Internal Medicine | Admitting: Internal Medicine

## 2021-06-13 ENCOUNTER — Encounter (HOSPITAL_COMMUNITY)
Admission: RE | Admit: 2021-06-13 | Discharge: 2021-06-13 | Disposition: A | Discharge status: Home / Self Care | Payer: Medicare Other | Source: Ambulatory Visit | Attending: Thoracic Surgery (Cardiothoracic Vascular Surgery) | Admitting: Thoracic Surgery (Cardiothoracic Vascular Surgery)

## 2021-06-13 ENCOUNTER — Other Ambulatory Visit: Payer: Self-pay

## 2021-06-13 VITALS — BP 161/54 | HR 83 | Temp 98.0°F | Resp 20 | Ht 60.0 in | Wt 176.0 lb

## 2021-06-13 VITALS — Ht 60.0 in | Wt 171.0 lb

## 2021-06-13 DIAGNOSIS — C3492 Malignant neoplasm of unspecified part of left bronchus or lung: Secondary | ICD-10-CM

## 2021-06-13 DIAGNOSIS — C799 Secondary malignant neoplasm of unspecified site: Secondary | ICD-10-CM

## 2021-06-13 DIAGNOSIS — Z87891 Personal history of nicotine dependence: Secondary | ICD-10-CM | POA: Diagnosis not present

## 2021-06-13 DIAGNOSIS — J449 Chronic obstructive pulmonary disease, unspecified: Secondary | ICD-10-CM | POA: Diagnosis not present

## 2021-06-13 DIAGNOSIS — C7951 Secondary malignant neoplasm of bone: Secondary | ICD-10-CM

## 2021-06-13 DIAGNOSIS — Z01818 Encounter for other preprocedural examination: Secondary | ICD-10-CM

## 2021-06-13 DIAGNOSIS — J910 Malignant pleural effusion: Secondary | ICD-10-CM | POA: Insufficient documentation

## 2021-06-13 DIAGNOSIS — J90 Pleural effusion, not elsewhere classified: Secondary | ICD-10-CM

## 2021-06-13 DIAGNOSIS — I2510 Atherosclerotic heart disease of native coronary artery without angina pectoris: Secondary | ICD-10-CM | POA: Diagnosis not present

## 2021-06-13 DIAGNOSIS — Z79899 Other long term (current) drug therapy: Secondary | ICD-10-CM | POA: Insufficient documentation

## 2021-06-13 DIAGNOSIS — Z8616 Personal history of COVID-19: Secondary | ICD-10-CM | POA: Insufficient documentation

## 2021-06-13 DIAGNOSIS — I130 Hypertensive heart and chronic kidney disease with heart failure and stage 1 through stage 4 chronic kidney disease, or unspecified chronic kidney disease: Secondary | ICD-10-CM | POA: Insufficient documentation

## 2021-06-13 DIAGNOSIS — I5032 Chronic diastolic (congestive) heart failure: Secondary | ICD-10-CM | POA: Insufficient documentation

## 2021-06-13 DIAGNOSIS — G4733 Obstructive sleep apnea (adult) (pediatric): Secondary | ICD-10-CM | POA: Diagnosis not present

## 2021-06-13 HISTORY — DX: Malignant (primary) neoplasm, unspecified: C80.1

## 2021-06-13 LAB — SURGICAL PCR SCREEN
MRSA, PCR: NEGATIVE
Staphylococcus aureus: NEGATIVE

## 2021-06-13 LAB — APTT: aPTT: 27 seconds (ref 24–36)

## 2021-06-13 LAB — PROTIME-INR
INR: 1.1 (ref 0.8–1.2)
Prothrombin Time: 13.9 seconds (ref 11.4–15.2)

## 2021-06-13 LAB — GLUCOSE, CAPILLARY: Glucose-Capillary: 112 mg/dL — ABNORMAL HIGH (ref 70–99)

## 2021-06-13 NOTE — Progress Notes (Signed)
Radiation Oncology         ([phone]) [phone] ________________________________  Initial Outpatient Consultation - Conducted via telephone due to current COVID-19 concerns for limiting patient exposure  I spoke with the patient to conduct this consult visit via telephone to spare the patient unnecessary potential exposure in the healthcare setting during the current COVID-19 pandemic. The patient was notified in advance and was offered a [place] meeting to allow for face to face communication but unfortunately reported that they did not have the appropriate resources/technology to support such a visit and instead preferred to proceed with a telephone consult.   ________________________________  Name: [name]        MRN: [id number]  Date of Service: 06/13/2021 DOB: [birth year]  ZD:GUYQI, Arvid Right, MD  Curt Bears, MD     REFERRING PHYSICIAN: Curt Bears, MD   DIAGNOSIS: The primary encounter diagnosis was Secondary malignant neoplasm of bone ([place]). Diagnoses of Metastatic adenocarcinoma ([place]) and Adenocarcinoma of left lung, stage 4 ([place]) were also pertinent to this visit.   HISTORY OF PRESENT ILLNESS: [name] is a 80 y.o. female seen at the request of Dr. Julien Nordmann for a new diagnosis of stage IV non-small cell adenocarcinoma not with EGFR mutated of the left lung.  The patient initially presented with 2 weeks of cough and presented to the emergency room found to have decreased breath sounds and chest x-ray showed a large pleural effusion and consolidation in the left lower lobe versus mass. She underwent ultrasound-guided thoracentesis on 05/04/2021 and cytology was concerning for non-small cell lung cancer, a total of 850 mL of fluid was removed.  She underwent an MRI of the brain with and without contrast on 06/02/2021 that showed a solitary punctate area of abnormal diffusion in the right parietal lobe without enhancement felt to be a infarct rather than malignancy but it was  recommended that she have a repeat MRI in 3 months time to confirm this.  She underwent a PET scan on 06/07/2021 which showed widespread disease involving the left lower lung, pleural space, right lung and thoracic lymph nodes, nodes in the abdomen and pelvis as well as in the left lower ribs and right iliac region.  A moderate left sided effusion and stigmata of atherosclerosis and pulmonary arterial enlargement were noted.  She had a repeat in 700 cc of fluid was withdrawn from the left pleural space.  She has met with Dr. Julien Nordmann and was prescribed Tagrisso.  She has not yet started this medication.  She is seen today however due to painful bone in the past disease for which she is taking MS Contin specifically in the right iliac and left rib region.  She is scheduled to undergo Pleurx catheter placement with Dr. Roxan Hockey on Thursday of this week.  She contacted today by phone to the best the options of palliative radiotherapy.  PREVIOUS RADIATION THERAPY: No   PAST MEDICAL HISTORY:  Past Medical History:  Diagnosis Date   Allergy    Angiodysplasia of cecum    Angiodysplasia of duodenum    Arthritis    Asteatotic eczema 02/25/2016   Asthma    Atrial fibrillation ([place]) 10/17/2017   Blood transfusion without reported diagnosis    Cataract    CHF (congestive heart failure), NYHA class III, chronic, combined ([place]) 08/10/2017   Colonic polyp 02/16/2008   Tubular adenoma   COPD (chronic obstructive pulmonary disease) ([place]) 01/27/98   Cor pulmonale ([place]) 11/16/2014   GERD 07/30/1992   Gout  HTN (hypertension) 07/30/1988   Hyperlipidemia with target LDL less than 100 11/27/1996        Kidney stone 1960, 1972, 1991   MGUS (monoclonal gammopathy of unknown significance) 11/08/2016   Morbid obesity ([place]) 04/19/2010   She agrees to work on her lifestyle modifications to help her lose weight.    OSA (obstructive sleep apnea) 06/22/2013   Osteopenia, senile 02/05/2013   July 2014  -2.1 left femur -1.9  left forearm    Prediabetes 11/13/2006   Renal insufficiency    Stenosis of cervical spine with myelopathy ([facility]) 01/01/2018   Ulcer of leg, chronic, right ([place]) 10/10/2011   ULCERATIVE COLITIS-LEFT SIDE 03/19/2008        Vitamin B12 deficiency anemia 11/13/2006        Vitamin D deficiency 02/06/2013       PAST SURGICAL HISTORY: Past Surgical History:  Procedure Laterality Date   BRONCHOSCOPY     COLONOSCOPY     COLONOSCOPY WITH PROPOFOL N/A 02/23/2015   Procedure: COLONOSCOPY WITH PROPOFOL;  Surgeon: Ladene Artist, MD;  Location: [facility];  Service: Endoscopy;  Laterality: N/A;   COLONOSCOPY WITH PROPOFOL N/A 08/12/2018   Procedure: COLONOSCOPY WITH PROPOFOL;  Surgeon: Ladene Artist, MD;  Location: [facility];  Service: Endoscopy;  Laterality: N/A;   COLONOSCOPY WITH PROPOFOL N/A 05/09/2020   Procedure: COLONOSCOPY WITH PROPOFOL;  Surgeon: Ladene Artist, MD;  Location: [facility];  Service: Endoscopy;  Laterality: N/A;  To Splenic Flexture   ESOPHAGOGASTRODUODENOSCOPY (EGD) WITH PROPOFOL N/A 02/23/2015   Procedure: ESOPHAGOGASTRODUODENOSCOPY (EGD) WITH PROPOFOL;  Surgeon: Ladene Artist, MD;  [facility];  Service: Endoscopy;  Laterality: N/A;   ESOPHAGOGASTRODUODENOSCOPY (EGD) WITH PROPOFOL N/A 08/12/2018   Procedure: ESOPHAGOGASTRODUODENOSCOPY (EGD) WITH PROPOFOL;  Surgeon: Ladene Artist, MD;  [facility];  Service: Endoscopy;  Laterality: N/A;   ESOPHAGOGASTRODUODENOSCOPY (EGD) WITH PROPOFOL N/A 05/08/2020   Procedure: ESOPHAGOGASTRODUODENOSCOPY (EGD) WITH PROPOFOL;  Surgeon: Yetta Flock, MD;  [facility]: [facility];  Service: Gastroenterology;  Laterality: N/A;   HOT HEMOSTASIS N/A 08/12/2018   Procedure: HOT HEMOSTASIS (ARGON PLASMA COAGULATION/BICAP);  Surgeon: Ladene Artist, MD;  Location: [facility];  Service: Endoscopy;  Laterality: N/A;  EGD and Colon APC   HOT HEMOSTASIS N/A 05/08/2020   Procedure: HOT HEMOSTASIS (ARGON PLASMA  COAGULATION/BICAP);  Surgeon: Yetta Flock, MD;  Location: [facility];  Service: Gastroenterology;  Laterality: N/A;   LAPAROTOMY N/A 08/11/2020   Procedure: EXPLORATORY LAPAROTOMY PARTIAL COLECTOMY AND COLOSTOMY WITH RESECTION OF A MESSENTERIC MASS;  Surgeon: Coralie Keens, MD;  Location: [facility];  Service: General;  Laterality: N/A;   POLYPECTOMY       FAMILY HISTORY:  Family History  Problem Relation Age of Onset   Stroke Mother    Diabetes Mother    Kidney cancer Mother    Stomach cancer Mother    Heart disease Mother    Stroke Father    Heart disease Brother    Heart disease Brother    Crohn's disease Brother    Heart disease Sister        CATH,STENT   Clotting disorder Sister    Cervical cancer Sister    Lung cancer Sister    Heart attack Sister    Diabetes Sister    Colon cancer Neg Hx    Esophageal cancer Neg Hx    Rectal cancer Neg Hx      SOCIAL HISTORY:  reports that she quit smoking about 25 years ago. Her  smoking use included cigarettes. She has never used smokeless tobacco. She reports that she does not drink alcohol and does not use drugs. The patient lives in [place] and is married.    ALLERGIES: Celebrex [celecoxib], Amlodipine besylate, Enalapril, Lipitor [atorvastatin], Amoxicillin, Codeine sulfate, and Hydrocodone-acetaminophen   MEDICATIONS:  Current Outpatient Medications  Medication Sig Dispense Refill   acetaminophen (TYLENOL) 325 MG tablet Take 325-650 mg by mouth daily as needed for moderate pain, fever or headache.      albuterol (PROAIR HFA) 108 (90 Base) MCG/ACT inhaler Inhale 1-2 puffs into the lungs every 6 (six) hours as needed for wheezing or shortness of breath. 18 g 11   allopurinol (ZYLOPRIM) 100 MG tablet TAKE 1 TABLET(100 MG) BY MOUTH DAILY 90 tablet 1   clonazePAM (KLONOPIN) 1 MG tablet Take 1 tablet (1 mg total) by mouth 2 (two) times daily as needed for anxiety. 180 tablet 0   ferrous sulfate 325 (65 FE) MG tablet  Take 1 tablet (325 mg total) by mouth 2 (two) times daily with a meal. 180 tablet 1   Fluticasone-Umeclidin-Vilant (TRELEGY ELLIPTA) 100-62.5-25 MCG/INH AEPB Inhale 1 puff into the lungs daily. 120 each 1   isosorbide-hydrALAZINE (BIDIL) 20-37.5 MG tablet TAKE 1 TABLET BY MOUTH THREE TIMES DAILY 270 tablet 0   metoprolol tartrate (LOPRESSOR) 25 MG tablet TAKE 1 TABLET(25 MG) BY MOUTH TWICE DAILY (Patient taking differently: Take 12.5 mg by mouth 2 (two) times daily.) 180 tablet 1   morphine (MS CONTIN) 15 MG 12 hr tablet Take 1 tablet (15 mg total) by mouth 2 (two) times daily as needed for pain. 60 tablet 0   torsemide (DEMADEX) 20 MG tablet TAKE 2 TABLETS(40 MG) BY MOUTH EVERY MORNING 180 tablet 0   guaiFENesin-dextromethorphan (ROBITUSSIN DM) 100-10 MG/5ML syrup Take 5 mLs by mouth every 4 (four) hours as needed for cough (chest congestion). (Patient not taking: No sig reported) 118 mL 0   osimertinib mesylate (TAGRISSO) 80 MG tablet Take 1 tablet (80 mg total) by mouth daily. (Patient not taking: Reported on 06/13/2021) 30 tablet 3   prochlorperazine (COMPAZINE) 10 MG tablet Take 1 tablet (10 mg total) by mouth every 6 (six) hours as needed. (Patient not taking: Reported on 06/13/2021) 30 tablet 2   Current Facility-Administered Medications  Medication Dose Route Frequency Provider Last Rate Last Admin   cyanocobalamin ((VITAMIN B-12)) injection 1,000 mcg  1,000 mcg Intramuscular Q30 days [name], MD   1,000 mcg at 01/31/21 1507     REVIEW OF SYSTEMS: On review of systems, the patient reports that she is having pain in the left chest wall that is described as shooting or sometimes burning in nature.  She states that it is hard to move and sometimes this pain comes on when she is trying to position herself comfortably.  She has been short of breath and is looking forward to having her pleural catheter placed so she can manage her effusion at home.  She continues to use oxygen at 2 to 3 L  continuously via nasal cannula at home.  She states that her pain medicine has been helpful and making adjustments has also improved some of her other symptoms of fatigue.  She is however still quite fatigued overall from her diagnosis and overall health.  She describes pain also in her right hip region, sometimes this bothers her more at nighttime.  She describes this as a deep and aching pain.  She also notes discomfort when urinating  at  times. She is hoarse when speaking and reports  She has had evaluation of this but not since this spring.  No other complaints are verbalized.     PHYSICAL EXAM:  Wt Readings from Last 3 Encounters:  06/13/21 171 lb (77.6 kg)  06/07/21 171 lb (77.6 kg)  05/23/21 180 lb 6.4 oz (81.8 kg)     Pain Assessment Pain Score: 6  Pain Loc: Chest/10  Unable to further assess due to encounter type     ECOG = 1  0 - Asymptomatic (Fully active, able to carry on all predisease activities without restriction)  1 - Symptomatic but completely ambulatory (Restricted in physically strenuous activity but ambulatory and able to carry out work of a light or sedentary nature. For example, light housework, office work)  2 - Symptomatic, <50% in bed during the day (Ambulatory and capable of all self care but unable to carry out any work activities. Up and about more than 50% of waking hours)  3 - Symptomatic, >50% in bed, but not bedbound (Capable of only limited self-care, confined to bed or chair 50% or more of waking hours)  4 - Bedbound (Completely disabled. Cannot carry on any self-care. Totally confined to bed or chair)  5 - Death   Eustace Pen MM, Creech RH, Tormey DC, et al. [phone]). "Toxicity and response criteria of the [facility]". [place] Oncol. 5 (6): 649-55    LABORATORY DATA:  Lab Results  Component Value Date   WBC 10.5 06/08/2021   HGB 10.0 (L) 06/08/2021   HCT 32.8 (L) 06/08/2021   MCV 83.2 06/08/2021   PLT 220 06/08/2021    Lab Results  Component Value Date   NA 140 06/08/2021   K 4.2 06/08/2021   CL 98 06/08/2021   CO2 32 06/08/2021   Lab Results  Component Value Date   ALT 6 06/08/2021   AST 13 (L) 06/08/2021   ALKPHOS 60 06/08/2021   BILITOT 0.3 06/08/2021      RADIOGRAPHY: DG Chest 1 View  Result Date: 06/08/2021 CLINICAL DATA:  Status post thoracentesis. EXAM: CHEST  1 VIEW COMPARISON:  06/07/2021 FINDINGS: Cardiac enlargement and aortic atherosclerosis. Moderate to large left pleural effusion is decreased in volume from previous exam status post thoracentesis. No pneumothorax visualized. Right lung is clear. IMPRESSION: Decrease in volume of left pleural effusion status post thoracentesis. No pneumothorax. Electronically Signed   By: Kerby Moors M.D.   On: 06/08/2021 14:16   DG Chest 2 View  Result Date: 06/07/2021 CLINICAL DATA:  metastatic adenocarcinoma EXAM: CHEST - 2 VIEW COMPARISON:  Chest radiograph May 05, 2021 FINDINGS: Large left pleural effusion. Overlying opacities in the left lung. Cardiac silhouette is largely obscured and mildly displaced the right. Prominent right hilar and left suprahilar silhouette. IMPRESSION: 1. Large left pleural effusion, increased 2. Overlying left lung opacities and right basilar opacities, which could represent atelectasis, pneumonia and/or aspiration. 3. Prominent right hilar and left suprahilar silhouette. Please correlate with same day PET-CT for evaluation of malignancy. Electronically Signed   By: Margaretha Sheffield M.D.   On: 06/07/2021 15:29   MR BRAIN W WO CONTRAST  Result Date: 06/05/2021 CLINICAL DATA:  80 year old female non-small cell lung cancer. Staging. EXAM: MRI HEAD WITHOUT AND WITH CONTRAST TECHNIQUE: Multiplanar, multiecho pulse sequences of the brain and surrounding structures were obtained without and with intravenous contrast. CONTRAST:  61m GADAVIST GADOBUTROL 1 MMOL/ML IV SOLN COMPARISON:  Brain MRI without contrast 09/24/2018.  FINDINGS: Brain:  Stable cerebral volume since 2020. In the posterior right parietal lobe there is a punctate area of cortical restricted diffusion (series 9, image 81) with no associated enhancement, and faint if any T2/FLAIR hyperintensity. No other abnormal diffusion. No abnormal enhancement identified. Black blood technique postcontrast imaging. No midline shift, mass effect, or evidence of intracranial mass lesion. No dural thickening. Chronic increased perivascular spaces in the brain. Chronic but increased widely scattered patchy subcortical and periventricular white matter T2 and FLAIR hyperintensity. Similar chronic T2 heterogeneity in the pons. But no cortical encephalomalacia or chronic cerebral blood products identified. No ventriculomegaly, extra-axial collection or acute intracranial hemorrhage. Cervicomedullary junction and pituitary are within normal limits. Vascular: Major intracranial vascular flow voids are stable. Skull and upper cervical spine: Negative visible cervical spine and spinal cord. Bone marrow signal appears stable since 2020, within normal limits. Sinuses/Orbits: Stable, negative. Other: Mastoids remain clear. Visible internal auditory structures appear normal. Negative visible scalp and face. IMPRESSION: 1. Solitary punctate abnormal diffusion in the right parietal lobe without enhancement. This is most likely a recent lacunar infarct (see also #3) which might be clinically silent. Repeat brain MRI without and with contrast in 3 months could be obtained to document resolution. 2. No metastatic disease identified. 3. Chronic but increased since 2020 signal changes in the cerebral white matter and pons most commonly due to chronic small vessel disease. Electronically Signed   By: Genevie Ann M.D.   On: 06/05/2021 10:48   NM PET Image Initial (PI) Skull Base To Thigh (F-18 FDG)  Result Date: 06/07/2021 CLINICAL DATA:  Initial treatment strategy for non-small-cell lung cancer. EXAM:  NUCLEAR MEDICINE PET SKULL BASE TO THIGH TECHNIQUE: 8.8 mCi F-18 FDG was injected intravenously. Full-ring PET imaging was performed from the skull base to thigh after the radiotracer. CT data was obtained and used for attenuation correction and anatomic localization. Fasting blood glucose: 121 mg/dl COMPARISON:  08/09/2020 abdominopelvic CT.  Chest CT of 05/20/2018 FINDINGS: Mediastinal blood pool activity: SUV max 2.6 Liver activity: SUV max NA NECK: No areas of abnormal hypermetabolism. Incidental CT findings: No cervical adenopathy. CHEST: Moderate to large left-sided pleural effusion with extensive pleural hypermetabolism consistent with pleural metastasis. Example inferiorly and posteriorly at a S.U.V. max of 11.2 (75/4). Within collapsed left lung, there are areas of hypermetabolism which likely represent lung primary or primaries. Example likely within the central left upper lobe at a S.U.V. max of 18.9 on 58/4. Within the posterior left lower lobe at a S.U.V. max of 9.8 on 70/4. Left hilar nodal metastasis with direct extension into the left side of the mediastinum. Example at a S.U.V. max of 24.5 including on 59/4. Contralateral hilar nodal metastasis, including at a S.U.V. max of 15.9 on 52/4. A right paravertebral versus pleural hypermetabolic nodule/implant measures 6 mm and a S.U.V. max of 4.2 on 77/4. Incidental CT findings: Aortic and coronary artery calcification. Pulmonary artery enlargement, outflow tract 3.4 cm. ABDOMEN/PELVIS: Anterior splenic focus of hypermetabolism is without CT correlate at a S.U.V. max of 5.7 on approximately image 103/4, suspicious. Distal colonic hypermetabolism is favored to be physiologic. A small left external iliac node corresponds to hypermetabolism at a S.U.V. max of 6.4 on 141/4. Nodule anterior to the right iliacus muscle measures 6 mm and a S.U.V. max of 4.4 on 113/4. Anterior pelvic wall soft tissue implant, about the posterior aspect of an area of pelvic wall  laxity measures 3.1 x 1.8 cm and a S.U.V. max of 15.8 on 145/4. Incidental CT  findings: Bilateral renal lesions which are likely cysts and complex cysts, but suboptimally evaluated. Normal adrenal glands. Descending loop colostomy. Large and small bowel within an area of pelvic anterior wall laxity. Abdominal aortic atherosclerosis. SKELETON: Osseous and soft tissue metastasis within the right iliac wing measures 2.4 cm and a S.U.V. max of 24.5 on 121/4. At least 1 possible left rib metastasis is difficult to differentiate from circumferential pleural hypermetabolism. Incidental CT findings: none IMPRESSION: 1. Widespread metastatic disease, including to thoracic nodes, left pleural space, abdomen/pelvis, bones. The primary or primaries are likely within the compressed left lung, as detailed above. 2. Incidental findings, including: Moderate left pleural effusion. Coronary artery atherosclerosis. Aortic Atherosclerosis (ICD10-I70.0). Pulmonary artery enlargement suggests pulmonary arterial hypertension. Electronically Signed   By: Abigail Miyamoto M.D.   On: 06/07/2021 15:44   US Thoracentesis Asp Pleural space w/IMG guide  Result Date: 06/08/2021 INDICATION: History of CHF and shortness of breath with persistent left pleural effusion. Request for IR to perform therapeutic thoracentesis EXAM: ULTRASOUND GUIDED THERAPEUTIC THORACENTESIS MEDICATIONS: 72m 1% lidocaine COMPLICATIONS: None immediate. PROCEDURE: An ultrasound guided thoracentesis was thoroughly discussed with the patient and questions answered. The benefits, risks, alternatives and complications were also discussed. The patient understands and wishes to proceed with the procedure. Written consent was obtained. Ultrasound was performed to localize and mark an adequate pocket of fluid in the left chest. The area was then prepped and draped in the normal sterile fashion. 1% Lidocaine was used for local anesthesia. Under ultrasound guidance a 6 Fr  Safe-T-Centesis catheter was introduced. Thoracentesis was performed. The catheter was removed and a dressing applied. FINDINGS: A total of approximately 700 cc of clear, yellow fluid was removed. Scratch IMPRESSION: Successful ultrasound guided left thoracentesis yielding 700 cc of pleural fluid. Read by: SNarda Rutherford NP Electronically Signed   By: DRuthann CancerM.D.   On: 06/08/2021 15:08       IMPRESSION/PLAN: 1. Stage IV, NSCLC, EGFR Mutated Adenocarcinoma of the left lung. Dr. MLisbeth Renshawdiscusses the pathology findings and reviews the nature of metastatic lung cancer.  Dr. MLisbeth Renshawrecommends that the patient begin to which she has not picked up from her pharmacy.  We discussed the rationale for palliative radiotherapy to the region of her left ribs and the chest as well as to her right iliac region.  Dr. MLisbeth Renshawdoes not feel that her PET scan shows any other targetable areas at this point but we will plan to offer radiotherapy and use her  PET imaging for fusion as well as wiring the location of her symptoms in the rib region as she also has pleural-based disease that is indistinguishable from her ribs.  We discussed the risks, benefits, short, and long term effects of radiotherapy, as well as the palliative intent, and the patient is interested in proceeding. Dr. MLisbeth Renshawdiscusses the delivery and logistics of radiotherapy and anticipates a course of 2 weeks of radiotherapy to her left ribs and right iliac wing.  She will be contacted by our simulation staff to coordinate and will need to sign consent at the time of this procedure. 2. Punctate finding in the brian on staging MRI. We will follow along with this but radiology felt this finding was more likely to be due to infarct rather than brain disease.  3. Dysuria. The patient may need a UA and culture to rule out infection, but also has disease in the pelvis. She will start targrisso, but if she's still having symptoms when she comes for  simulation we can  decide if she should have a UA and culture at that time.   Given current concerns for patient exposure during the COVID-19 pandemic, this encounter was conducted via telephone.  The patient has provided two factor identification and has given verbal consent for this type of encounter and has been advised to only accept a meeting of this type in a secure network environment. The time spent during this encounter was 60 minutes including preparation, discussion, and coordination of the patient's care. The attendants for this meeting include Blenda Nicely, RN, Dr. Lisbeth Renshaw, Hayden Pedro  and [name].  During the encounter,  Blenda Nicely, RN, Dr. Lisbeth Renshaw, and Hayden Pedro were located at [facility] Radiation Oncology Department.  AFRICA MASAKI was located at home.   The above documentation reflects my direct findings during this shared patient visit. Please see the separate note by Dr. Lisbeth Renshaw on this date for the remainder of the patient's plan of care.    Carola Rhine, [facility]   **Disclaimer: This note was dictated with voice recognition software. Similar sounding words can inadvertently be transcribed and this note may contain transcription errors which may not have been corrected upon publication of note.**

## 2021-06-13 NOTE — Progress Notes (Signed)
Anesthesia PAT Evaluation:  Case: [id number] Date/Time: 06/15/21 1404   Procedure: INSERTION PLEURAL DRAINAGE CATHETER (Left)   Anesthesia type: Monitor Anesthesia Care   Pre-op diagnosis: MALIGNANT LEFT PLEURAL EFFUSION   Location: [facility] OR ROOM 15 / [place] [facility] OR   Surgeons: [name], MD       DISCUSSION: Patient is an 80 year old female scheduled for the above procedure.  History includes former smoker (quit 06/01/96), COPD (home O2 x ~ 7 years), asthma, HTN, HLD, pre-diabetes, OSA (does not use CPAP), chronic diastolic CHF, PAF (diagnosed 09/2017 in setting of GI bleed/AVMs, not felt to be a candidate for anticoagulation), tachy-brady syndrome (with bradycardia; she declined PPM 02/03/18), stage IV adenocarcinoma (+TTF-1, consistent with lung primary per cytology 05/04/21), CKD (with right kidney mass, RCC suspected 07/2020, observation per Dr. Tresa Moore), MGUS, GERD, ulcerative colitis.  Admission 05/04/21-05/09/21 for acute on chronic respiratory failure with hypoxia with large left pleural effusion. S/p left thoracentesis 05/04/21 (850 cc; + metastatic adenocarcinoma, TTF-1+ consistent with lung primary).  CXR showed evidence of consolidation and was treated with IV Rocephin and Zithromax for 5 days.  She declined SNF placement, so home health therapies is arranged.  She was later referred to oncology at post hospital follow-up visit.  She was evaluated by Dr. Julien Nordmann on 05/23/2021. PET scan and brain MRI ordered. Plan for osimertinib mesylate when approved. She was also referred to CT surgery for consideration of Pleurx.  She was seen by Dr. Roxan Hockey on 06/07/2021.  Left pleural effusion had reaccumulated.  He offered temporizing thoracentesis until 06/15/21 surgery, but she initially declined; however ultimately required left thoracentesis 06/08/21 (700 cc) for worsening SOB. RAD-ONC evaluation on 06/13/21 for consideration of palliative radiotherapy.   Admission 08/09/20-08/19/20 with abdominal pain  with known large partially incarcerated ventral hernia with new air/fluid collection within the hernia, possibly diverticulitis or ruptured appendicitis.  Also with 3.5 cm right renal mass on imaging and was COVID-19 positive (s/p remdesivir). She was started on antibiotics, and abdominal drain placed by IR. Urologist Dr. Tresa Moore consulted for renal mass and felt this was "likely non-metastatic slow-growing renal cancer". Curative therapy would may push her CKD into ESRD and she adamantly refused dialysis, so he advised watchful waiting unless symptomatic or concern for metastatic disease. IR drain with purulent brown/feculant fluid, so decision made to proceed to OR following preoperative input from cardiology and pulmonology. S/p exploratory laparotomy, resection of mesenteric mass, partial colectomy, end colostomy 08/11/20. Pathology showed 3 colonic tubular adenomas, no high grade dysplasia or carcinoma with fibromembranous connective tissue with acutely inflamed granulation tissue consistent with incarcerated hernia.  She progressed well after surgery and diet advanced.  Required PRBC for anemia.  Transitioned from Unasyn to Augmentin for total of 10-day course of antibiotics post-surgery (initial cultures + E. Coli). Colostomy takedown was planned, and she had preoperative cardiology evaluation with Tommye Standard, PA-C on 01/23/21, and Lexiscan stress test was planned and well as ZioAT to assess for bradycardia/PAF burden. Zio showed no afib with brief SVT, no significant pauses or sustained bradycardia. Patient cancelled stress test because she was not sure she was going to proceed with surgery.   Admission 05/07/20-05/12/20 for recurrent GI bleed. S/p 2 units PRBC. 05/08/20 EGD showed normal esophagus and stomach, 3 nonbleeding angiodysplastic lesions in the duodenum status post APC.  These were diminutive AVMs and not thought to be the cause of her symptoms. Cecal AVMs suspected given history but unable to  fully assess with colonoscopy due to chronically incarcerate umbilical  hernia. Cardiology consulted and not felt to be prohibitive risk for hernia surgery, but patient concerned about respiratory status and fear of VDRF. Her bleeding resolved and conservative management planned with reconsideration of surgery for recurrent GI bleed.  Patient evaluated at her PAT visit after RN reported that patient arrived with O2 sats on RA of ~ 74%. She said she was not able to refill her oxygen tank and that issues was still not resolved after company sent representative. She had been without home O2 for ~ 30 minutes. PAT RN placed patient on O2 at 2L on arrival but had to place on 3L/[place] to initially get O2 sat > 90%, but then sats up to 100% and able to put patient back on her home regimen of 2L/[place] with sat of 99%. She reportedly did not appear in acute distress when she first arrived to PAT despite her low O2 saturation. She was sitting in a wheelchair when I evaluated her and did not exhibit increased WOB at rest. No conversational dyspnea. She did report DOE with minimal exertion. Says she feels generally fatigued. She has been sleeping in a reclining chair at night. She felt breathing was stable (had relief after 06/08/21 thoracentesis). She has intermittent mildly congested cough which is not new. She denied fevers. She is sore along her left rib cage. She has not noticed any recent/persistent afib symptoms. She has BLE edema which she said was chronic. Heart RRR, 2/6 murmur. Lungs sounds very diminished left based. No wheezing noted. PAT RN was able to arrange home O2 tank per CSW. Granddaughter and/or daughter with patient at PAT. Patient lives with her husband and another daughter who is disabled. Her O2 sats > 94% once back on O2/[place]. CXR report is still pending, but still with left pleural effusion. She respiratory status stable at her PAT visit once back on O2, so will go home from PAT on 2L/[place]. Should she develop  worsening dyspnea or hypoxia at home (she has home pulse oximeter), then she knows she will need to be evaluated in the ED. She denied any known anesthesia complications.  CXR is still in process. Anesthesia team to evaluate on the day of surgery.    VS: BP (!) 161/54   Pulse 83   Temp 36.7 C   Resp 20   Ht 5' (1.524 m)   Wt 79.8 kg   SpO2 100%   BMI 34.37 kg/m  Provider wore N95 and face mask. Patient wore universal face mask.    PROVIDERS: [name], MD is PCP Donne Anon, MD is Inge Rise, MD is EP cardiologist Larae Grooms, MD is primary cardiologist  Alexis Frock, MD is urologist    LABS: Labs as of 06/18/21: BUN 40, Cr 1.37, H/H 10.0/32.8, PLT 220, WBC 10.5, AST 13, ALT 6, glucose 108. PT/INR 13.9/1.1 on 06/13/21.    IMAGES: CXR 06/13/21: PENDING.  1V CXR 06/08/21 (post left thoracentesis): IMPRESSION: Decrease in volume of left pleural effusion status post thoracentesis. No pneumothorax.   PET Scan 06/07/21: IMPRESSION: 1. Widespread metastatic disease, including to thoracic nodes, left pleural space, abdomen/pelvis, bones. The primary or primaries are likely within the compressed left lung, as detailed above. 2. Incidental findings, including: Moderate left pleural effusion. Coronary artery atherosclerosis. Aortic Atherosclerosis (ICD10-I70.0). Pulmonary artery enlargement suggests pulmonary arterial hypertension.   MRI Brain 06/02/21: IMPRESSION: 1. Solitary punctate abnormal diffusion in the right parietal lobe without enhancement. This is most likely a recent lacunar infarct (see also #3) which  might be clinically silent. Repeat brain MRI without and with contrast in 3 months could be obtained to document resolution. 2. No metastatic disease identified. 3. Chronic but increased since 2020 signal changes in the cerebral white matter and pons most commonly due to chronic small vessel disease.     EKG: 06/08/21: Sinus  rhythm with Premature atrial complexes Left ventricular hypertrophy with repolarization abnormality ( R in aVL , Romhilt-Estes ) Abnormal ECG since last tracing no significant change Confirmed by Larae Grooms [phone]) on 06/09/2021 10:36:18 PM   CV: Long term monitor 01/28/21-02/10/21: Study Highlights Normal sinus rhythm with rare PACs and PVCs. Brief runs of SVT associated with symptoms. No sustained arrhtyhmias.   Echo 05/29/20: IMPRESSIONS   1. Left ventricular ejection fraction, by estimation, is 60 to 65%. The  left ventricle has normal function. The left ventricle has no regional  wall motion abnormalities. There is moderate concentric left ventricular  hypertrophy. Left ventricular  diastolic parameters are consistent with Grade II diastolic dysfunction  (pseudonormalization).   2. Right ventricular systolic function is normal. The right ventricular  size is normal.   3. Left atrial size was mildly dilated.   4. The mitral valve is normal in structure. Trivial mitral valve  regurgitation.   5. The aortic valve is tricuspid. There is mild calcification of the  aortic valve. There is mild thickening of the aortic valve. Aortic valve  regurgitation is trivial. Mild aortic valve sclerosis is present, with no  evidence of aortic valve stenosis.   6. The inferior vena cava is normal in size with greater than 50%  respiratory variability, suggesting right atrial pressure of 3 mmHg.  Comparison(s): No significant change from prior study.    Nuclear stress test 04/26/10: Overall Impression: Normal stress nuclear study.  Past Medical History:  Diagnosis Date   Allergy    Angiodysplasia of cecum    Angiodysplasia of duodenum    Arthritis    Asteatotic eczema 02/25/2016   Asthma    Atrial fibrillation ([place]) 10/17/2017   Blood transfusion without reported diagnosis    Cancer ([place])    Cataract    CHF (congestive heart failure), NYHA class III, chronic, combined ([place])  08/10/2017   Colonic polyp 02/16/2008   Tubular adenoma   COPD (chronic obstructive pulmonary disease) ([place]) 01/27/1998   Cor pulmonale ([place]) 11/16/2014   GERD 07/30/1992   Gout    HTN (hypertension) 07/30/1988   Hyperlipidemia with target LDL less than 100 11/27/1996        Kidney stone 1960, 1972, 1991   MGUS (monoclonal gammopathy of unknown significance) 11/08/2016   Morbid obesity ([place]) 04/19/2010   She agrees to work on her lifestyle modifications to help her lose weight.    OSA (obstructive sleep apnea) 06/22/2013   Osteopenia, senile 02/05/2013   July 2014  -2.1 left femur -1.9 left forearm    Prediabetes 11/13/2006   Renal insufficiency    Stenosis of cervical spine with myelopathy ([place]) 01/01/2018   Ulcer of leg, chronic, right ([place]) 10/10/2011   ULCERATIVE COLITIS-LEFT SIDE 03/19/2008        Vitamin B12 deficiency anemia 11/13/2006        Vitamin D deficiency 02/06/2013    Past Surgical History:  Procedure Laterality Date   BRONCHOSCOPY     COLONOSCOPY     COLONOSCOPY WITH PROPOFOL N/A 02/23/2015   Procedure: COLONOSCOPY WITH PROPOFOL;  Surgeon: Ladene Artist, MD;  Location: [facility];  Service: Endoscopy;  Laterality: N/A;  COLONOSCOPY WITH PROPOFOL N/A 08/12/2018   Procedure: COLONOSCOPY WITH PROPOFOL;  Surgeon: Ladene Artist, MD;  Location: [facility];  Service: Endoscopy;  Laterality: N/A;   COLONOSCOPY WITH PROPOFOL N/A 05/09/2020   Procedure: COLONOSCOPY WITH PROPOFOL;  Surgeon: Ladene Artist, MD;  Location: [facility];  Service: Endoscopy;  Laterality: N/A;  To Splenic Flexture   ESOPHAGOGASTRODUODENOSCOPY (EGD) WITH PROPOFOL N/A 02/23/2015   Procedure: ESOPHAGOGASTRODUODENOSCOPY (EGD) WITH PROPOFOL;  Surgeon: Ladene Artist, MD;  [facility];  Service: Endoscopy;  Laterality: N/A;   ESOPHAGOGASTRODUODENOSCOPY (EGD) WITH PROPOFOL N/A 08/12/2018   Procedure: ESOPHAGOGASTRODUODENOSCOPY (EGD) WITH PROPOFOL;  Surgeon: Ladene Artist,  MD;  [facility];  Service: Endoscopy;  Laterality: N/A;   ESOPHAGOGASTRODUODENOSCOPY (EGD) WITH PROPOFOL N/A 05/08/2020   Procedure: ESOPHAGOGASTRODUODENOSCOPY (EGD) WITH PROPOFOL;  Surgeon: Yetta Flock, MD;  [facility];  Service: Gastroenterology;  Laterality: N/A;   HOT HEMOSTASIS N/A 08/12/2018   Procedure: HOT HEMOSTASIS (ARGON PLASMA COAGULATION/BICAP);  Surgeon: Ladene Artist, MD;  Location: [facility];  Service: Endoscopy;  Laterality: N/A;  EGD and Colon APC   HOT HEMOSTASIS N/A 05/08/2020   Procedure: HOT HEMOSTASIS (ARGON PLASMA COAGULATION/BICAP);  Surgeon: Yetta Flock, MD;  Location: [facility];  Service: Gastroenterology;  Laterality: N/A;   LAPAROTOMY N/A 08/11/2020   Procedure: EXPLORATORY LAPAROTOMY PARTIAL COLECTOMY AND COLOSTOMY WITH RESECTION OF A MESSENTERIC MASS;  Surgeon: Coralie Keens, MD;  Location: [facility];  Service: General;  Laterality: N/A;   POLYPECTOMY      MEDICATIONS:  acetaminophen (TYLENOL) 325 MG tablet   albuterol (PROAIR HFA) 108 (90 Base) MCG/ACT inhaler   allopurinol (ZYLOPRIM) 100 MG tablet   clonazePAM (KLONOPIN) 1 MG tablet   ferrous sulfate 325 (65 FE) MG tablet   Fluticasone-Umeclidin-Vilant (TRELEGY ELLIPTA) 100-62.5-25 MCG/INH AEPB   guaiFENesin-dextromethorphan (ROBITUSSIN DM) 100-10 MG/5ML syrup   isosorbide-hydrALAZINE (BIDIL) 20-37.5 MG tablet   metoprolol tartrate (LOPRESSOR) 25 MG tablet   morphine (MS CONTIN) 15 MG 12 hr tablet   osimertinib mesylate (TAGRISSO) 80 MG tablet   prochlorperazine (COMPAZINE) 10 MG tablet   torsemide (DEMADEX) 20 MG tablet    cyanocobalamin ((VITAMIN B-12)) injection 1,000 mcg     Myra Gianotti, PA-C Surgical Short Stay/Anesthesiology [facility] Phone [phone] [facility] Phone [phone] 06/13/2021 6:20 PM

## 2021-06-13 NOTE — Telephone Encounter (Signed)
Oral Oncology Pharmacist Encounter  Received notification from [employer] Patient Assistance program that patient has been successfully enrolled into their program to receive Tagrisso from the manufacturer at $0 out of pocket until 07/29/2022.    Specialty Pharmacy that will dispense medication is [facility].  Medication should be delivered to patient's home by end of week. Patient knows to start taking medication once received (estimated start date of 06/16/21-06/17/21).  Patient knows to call the office with questions or concerns.  Leron Croak, PharmD, BCPS Hematology/Oncology Clinical Pharmacist [facility] [place] [phone] 06/13/2021 3:16 PM

## 2021-06-13 NOTE — Anesthesia Preprocedure Evaluation (Addendum)
Anesthesia Evaluation  Patient identified by MRN, date of birth, ID band Patient awake    Reviewed: Allergy & Precautions, H&P , NPO status , Patient's Chart, lab work & pertinent test results  Airway Mallampati: II  TM Distance: >3 FB Neck ROM: Full    Dental no notable dental hx.    Pulmonary asthma , sleep apnea , COPD,  oxygen dependent, former smoker,    Pulmonary exam normal breath sounds clear to auscultation + decreased breath sounds      Cardiovascular hypertension, + dysrhythmias Atrial Fibrillation  Rhythm:Irregular Rate:Normal  Echo 05/29/20: IMPRESSIONS  1. Left ventricular ejection fraction, by estimation, is 60 to 65%. The  left ventricle has normal function. The left ventricle has no regional  wall motion abnormalities. There is moderate concentric left ventricular  hypertrophy. Left ventricular  diastolic parameters are consistent with Grade II diastolic dysfunction  (pseudonormalization).  2. Right ventricular systolic function is normal. The right ventricular  size is normal.  3. Left atrial size was mildly dilated.  4. The mitral valve is normal in structure. Trivial mitral valve  regurgitation.  5. The aortic valve is tricuspid. There is mild calcification of the  aortic valve. There is mild thickening of the aortic valve. Aortic valve  regurgitation is trivial. Mild aortic valve sclerosis is present, with no  evidence of aortic valve stenosis.  6. The inferior vena cava is normal in size with greater than 50%  respiratory variability, suggesting right atrial pressure of 3 mmHg.  Comparison(s): No significant change from prior study.     Neuro/Psych negative neurological ROS  negative psych ROS   GI/Hepatic negative GI ROS, Neg liver ROS,   Endo/Other  negative endocrine ROS  Renal/GU negative Renal ROS  negative genitourinary   Musculoskeletal negative musculoskeletal ROS (+)    Abdominal   Peds negative pediatric ROS (+)  Hematology negative hematology ROS (+)   Anesthesia Other Findings   Reproductive/Obstetrics negative OB ROS                            Anesthesia Physical Anesthesia Plan  ASA: 4  Anesthesia Plan: MAC   Post-op Pain Management:    Induction: Intravenous  PONV Risk Score and Plan: 2 and Propofol infusion and Treatment may vary due to age or medical condition  Airway Management Planned: Simple Face Mask  Additional Equipment:   Intra-op Plan:   Post-operative Plan:   Informed Consent: I have reviewed the patients History and Physical, chart, labs and discussed the procedure including the risks, benefits and alternatives for the proposed anesthesia with the patient or authorized representative who has indicated his/her understanding and acceptance.     Dental advisory given  Plan Discussed with: CRNA and Surgeon  Anesthesia Plan Comments: (PAT note written 06/13/2021 by Myra Gianotti, PA-C. )       Anesthesia Quick Evaluation

## 2021-06-13 NOTE — Progress Notes (Signed)
PCP - Dr. [name] Cardiologist - Dr. Jose Persia. Allred  PPM/ICD - N/a  Chest x-ray - 06/13/21 EKG - 06/08/21 Stress Test - 2011 ECHO - 05/11/20 Cardiac Cath - denies  Sleep Study - OSA+ CPAP - denies use  Blood Thinner Instructions: n/a Aspirin Instructions: n/a  ERAS Protcol -Clear liquids until 1115 DOS. PRE-SURGERY Ensure or G2- none ordered.   COVID TEST- Ambulatory procedure.   Anesthesia review: Yes, cardiac and pulmonary hx.  Pt presented to PAT appt with O2 sats in the 70's. Pt states that she has had issues with her oxygen tanks at home and has reached out to [employer] multiple times without answers. Pt stated that she came to the hospital without oxygen and had been off since 1330. Pt immediately placed on O2 via [place] and pt's sats came up. Oxygen saturation was 100% on 3L. After about 30 minutes pt placed back on baseline amount of 2L. Reached out to [employer], who reached out to [employer]. [employer] aware of the issue and will be contacting pt at home in regards to fixing her O2 tanks. Myra Gianotti, PA-C to see pt during PAT appt.  Patient denies shortness of breath, fever, cough and chest pain at PAT appointment   All instructions explained to the patient, with a verbal understanding of the material. Patient agrees to go over the instructions while at home for a better understanding. Patient also instructed to self quarantine after being tested for COVID-19. The opportunity to ask questions was provided.

## 2021-06-13 NOTE — Pre-Procedure Instructions (Signed)
Surgical Instructions    Your procedure is scheduled on Thursday, June 15, 2021 at 2:19 PM.  Report to [facility] Main Entrance "A" at 12:15 P.M., then check in with the Admitting office.  Call this number if you have problems the morning of surgery:  [phone]   If you have any questions prior to your surgery date call [phone]: Open Monday-Friday 8am-4pm    Remember:  Do not eat after midnight the night before your surgery  You may drink clear liquids until 11:15 AM the morning of your surgery.   Clear liquids allowed are: Water, Non-Citrus Juices (without pulp), Carbonated Beverages, Clear Tea, Black Coffee Only, and Gatorade    Take these medicines the morning of surgery with A SIP OF WATER:  allopurinol (ZYLOPRIM) Fluticasone-Umeclidin-Vilant (TRELEGY ELLIPTA) isosorbide-hydrALAZINE (BIDIL) metoprolol tartrate (LOPRESSOR)  IF NEEDED: acetaminophen (TYLENOL)  albuterol (PROAIR HFA)  clonazePAM (KLONOPIN) morphine (MS CONTIN)  Please bring all inhalers with you the day of surgery.    As of today, STOP taking any Aspirin (unless otherwise instructed by your surgeon) Aleve, Naproxen, Ibuprofen, Motrin, Advil, Goody's, BC's, all herbal medications, fish oil, and all vitamins.                     Do NOT Smoke (Tobacco/Vaping) or drink Alcohol 24 hours prior to your procedure.  If you use a CPAP at night, you may bring all equipment for your overnight stay.   Contacts, glasses, piercing's, hearing aid's, dentures or partials may not be worn into surgery, please bring cases for these belongings.    For patients admitted to the hospital, discharge time will be determined by your treatment team.   Patients discharged the day of surgery will not be allowed to drive home, and someone needs to stay with them for 24 hours.  NO VISITORS WILL BE ALLOWED IN PRE-OP WHERE PATIENTS GET READY FOR SURGERY.  ONLY 1 SUPPORT PERSON MAY BE PRESENT IN THE WAITING ROOM WHILE YOU ARE  IN SURGERY.  IF YOU ARE TO BE ADMITTED, ONCE YOU ARE IN YOUR ROOM YOU WILL BE ALLOWED TWO (2) VISITORS.  Minor children may have two parents present. Special consideration for safety and communication needs will be reviewed on a case by case basis.   Special instructions:   [place]- Preparing For Surgery  Before surgery, you can play an important role. Because skin is not sterile, your skin needs to be as free of germs as possible. You can reduce the number of germs on your skin by washing with CHG (chlorahexidine gluconate) Soap before surgery.  CHG is an antiseptic cleaner which kills germs and bonds with the skin to continue killing germs even after washing.    Oral Hygiene is also important to reduce your risk of infection.  Remember - BRUSH YOUR TEETH THE MORNING OF SURGERY WITH YOUR REGULAR TOOTHPASTE  Please do not use if you have an allergy to CHG or antibacterial soaps. If your skin becomes reddened/irritated stop using the CHG.  Do not shave (including legs and underarms) for at least 48 hours prior to first CHG shower. It is OK to shave your face.  Please follow these instructions carefully.   Shower the NIGHT BEFORE SURGERY and the MORNING OF SURGERY  If you chose to wash your hair, wash your hair first as usual with your normal shampoo.  After you shampoo, rinse your hair and body thoroughly to remove the shampoo.  Use CHG Soap as you would any  other liquid soap. You can apply CHG directly to the skin and wash gently with a scrungie or a clean washcloth.   Apply the CHG Soap to your body ONLY FROM THE NECK DOWN.  Do not use on open wounds or open sores. Avoid contact with your eyes, ears, mouth and genitals (private parts). Wash Face and genitals (private parts)  with your normal soap.   Wash thoroughly, paying special attention to the area where your surgery will be performed.  Thoroughly rinse your body with warm water from the neck down.  DO NOT shower/wash with your  normal soap after using and rinsing off the CHG Soap.  Pat yourself dry with a CLEAN TOWEL.  Wear CLEAN PAJAMAS to bed the night before surgery  Place CLEAN SHEETS on your bed the night before your surgery  DO NOT SLEEP WITH PETS.   Day of Surgery: Shower with CHG soap. Do not wear jewelry, make up, nail polish, gel polish, artificial nails, or any other type of covering on natural nails including finger and toenails. If patients have artificial nails, gel coating, etc. that need to be removed by a nail salon please have this removed prior to surgery. Surgery may need to be canceled/delayed if the surgeon/ anesthesia feels like the patient is unable to be adequately monitored. Do not wear lotions, powders, perfumes, or deodorant. Do not shave 48 hours prior to surgery. Do not bring valuables to the hospital. [facility] is not responsible for any belongings or valuables. Wear Clean/Comfortable clothing the morning of surgery Remember to brush your teeth WITH YOUR REGULAR TOOTHPASTE.   Please read over the following fact sheets that you were given.

## 2021-06-13 NOTE — Discharge Planning (Signed)
RNCM received call from [place] regarding pt needing assistance with maintenance of home DME Oxygen.  RNCM contacted provider ([facility]) to contact pt and arrange for service to DME.   Pt also needs oxygen tank for transportation home.  RNCM notified [place] to supply portable tank for ride home today.

## 2021-06-14 ENCOUNTER — Telehealth: Payer: Self-pay | Admitting: *Deleted

## 2021-06-14 NOTE — Telephone Encounter (Signed)
I called [name] to see if she had any questions about her upcoming procedure. I clarified questions.  I told her I was thinking about her and just wanted to let her know.  She was thankful for the call.

## 2021-06-15 ENCOUNTER — Ambulatory Visit (HOSPITAL_COMMUNITY): Payer: Medicare Other | Admitting: Vascular Surgery

## 2021-06-15 ENCOUNTER — Other Ambulatory Visit: Payer: Self-pay

## 2021-06-15 ENCOUNTER — Ambulatory Visit (HOSPITAL_COMMUNITY): Payer: Medicare Other

## 2021-06-15 ENCOUNTER — Encounter (HOSPITAL_COMMUNITY): Payer: Self-pay | Admitting: Thoracic Surgery (Cardiothoracic Vascular Surgery)

## 2021-06-15 ENCOUNTER — Ambulatory Visit (HOSPITAL_COMMUNITY): Payer: Medicare Other | Admitting: Certified Registered"

## 2021-06-15 ENCOUNTER — Telehealth: Payer: Self-pay | Admitting: Surgery

## 2021-06-15 ENCOUNTER — Encounter (HOSPITAL_COMMUNITY)
Admission: RE | Disposition: A | Discharge status: Home / Self Care | Payer: Self-pay | Source: Home / Self Care | Attending: Thoracic Surgery (Cardiothoracic Vascular Surgery)

## 2021-06-15 ENCOUNTER — Ambulatory Visit (HOSPITAL_COMMUNITY)
Admission: RE | Admit: 2021-06-15 | Discharge: 2021-06-15 | Disposition: A | Discharge status: Home / Self Care | Payer: Medicare Other | Attending: Thoracic Surgery (Cardiothoracic Vascular Surgery) | Admitting: Thoracic Surgery (Cardiothoracic Vascular Surgery)

## 2021-06-15 DIAGNOSIS — E559 Vitamin D deficiency, unspecified: Secondary | ICD-10-CM | POA: Diagnosis not present

## 2021-06-15 DIAGNOSIS — E785 Hyperlipidemia, unspecified: Secondary | ICD-10-CM | POA: Insufficient documentation

## 2021-06-15 DIAGNOSIS — J90 Pleural effusion, not elsewhere classified: Secondary | ICD-10-CM

## 2021-06-15 DIAGNOSIS — C3492 Malignant neoplasm of unspecified part of left bronchus or lung: Secondary | ICD-10-CM | POA: Diagnosis not present

## 2021-06-15 DIAGNOSIS — I110 Hypertensive heart disease with heart failure: Secondary | ICD-10-CM | POA: Insufficient documentation

## 2021-06-15 DIAGNOSIS — J910 Malignant pleural effusion: Secondary | ICD-10-CM | POA: Diagnosis not present

## 2021-06-15 DIAGNOSIS — Z9981 Dependence on supplemental oxygen: Secondary | ICD-10-CM | POA: Diagnosis not present

## 2021-06-15 DIAGNOSIS — G4733 Obstructive sleep apnea (adult) (pediatric): Secondary | ICD-10-CM | POA: Diagnosis not present

## 2021-06-15 DIAGNOSIS — K5520 Angiodysplasia of colon without hemorrhage: Secondary | ICD-10-CM | POA: Diagnosis not present

## 2021-06-15 DIAGNOSIS — I2781 Cor pulmonale (chronic): Secondary | ICD-10-CM | POA: Insufficient documentation

## 2021-06-15 DIAGNOSIS — Z87891 Personal history of nicotine dependence: Secondary | ICD-10-CM | POA: Diagnosis not present

## 2021-06-15 DIAGNOSIS — J9611 Chronic respiratory failure with hypoxia: Secondary | ICD-10-CM | POA: Diagnosis not present

## 2021-06-15 DIAGNOSIS — D472 Monoclonal gammopathy: Secondary | ICD-10-CM | POA: Diagnosis not present

## 2021-06-15 DIAGNOSIS — C3490 Malignant neoplasm of unspecified part of unspecified bronchus or lung: Secondary | ICD-10-CM | POA: Diagnosis not present

## 2021-06-15 DIAGNOSIS — M109 Gout, unspecified: Secondary | ICD-10-CM | POA: Diagnosis not present

## 2021-06-15 DIAGNOSIS — J449 Chronic obstructive pulmonary disease, unspecified: Secondary | ICD-10-CM | POA: Diagnosis not present

## 2021-06-15 DIAGNOSIS — I509 Heart failure, unspecified: Secondary | ICD-10-CM | POA: Insufficient documentation

## 2021-06-15 DIAGNOSIS — I4891 Unspecified atrial fibrillation: Secondary | ICD-10-CM | POA: Diagnosis not present

## 2021-06-15 HISTORY — PX: CHEST TUBE INSERTION: SHX231

## 2021-06-15 LAB — GLUCOSE, CAPILLARY: Glucose-Capillary: 108 mg/dL — ABNORMAL HIGH (ref 70–99)

## 2021-06-15 SURGERY — INSERTION, PLEURAL DRAINAGE CATHETER
Anesthesia: Monitor Anesthesia Care | Laterality: Left

## 2021-06-15 MED ORDER — ORAL CARE MOUTH RINSE: 15.0000 mL | Freq: Once | OROMUCOSAL | Status: AC

## 2021-06-15 MED ORDER — DEXAMETHASONE SODIUM PHOSPHATE 10 MG/ML IJ SOLN
INTRAMUSCULAR | Status: DC | PRN
  Administered 2021-06-15: 4 mg via INTRAVENOUS

## 2021-06-15 MED ORDER — ONDANSETRON HCL 4 MG/2ML IJ SOLN
INTRAMUSCULAR | Status: DC | PRN
  Administered 2021-06-15: 4 mg via INTRAVENOUS

## 2021-06-15 MED ORDER — MORPHINE SULFATE 15 MG PO TABS: 15.0000 mg | ORAL_TABLET | Freq: Three times a day (TID) | ORAL | Status: DC | PRN

## 2021-06-15 MED ORDER — HEPARIN SODIUM (PORCINE) 1000 UNIT/ML IJ SOLN
INTRAMUSCULAR | Status: AC
  Filled 2021-06-15: qty 1

## 2021-06-15 MED ORDER — BUPIVACAINE HCL (PF) 0.5 % IJ SOLN
INTRAMUSCULAR | Status: AC
  Filled 2021-06-15: qty 30

## 2021-06-15 MED ORDER — CHLORHEXIDINE GLUCONATE 0.12 % MT SOLN: 15.0000 mL | Freq: Once | OROMUCOSAL | Status: AC

## 2021-06-15 MED ORDER — PROPOFOL 500 MG/50ML IV EMUL
INTRAVENOUS | Status: DC | PRN
  Administered 2021-06-15: 50 ug/kg/min via INTRAVENOUS

## 2021-06-15 MED ORDER — MORPHINE SULFATE ER 15 MG PO TBCR: 15.0000 mg | EXTENDED_RELEASE_TABLET | Freq: Two times a day (BID) | ORAL | 0 refills | Status: DC | PRN

## 2021-06-15 MED ORDER — STERILE WATER FOR IRRIGATION IR SOLN
Status: DC | PRN
  Administered 2021-06-15: 1000 mL

## 2021-06-15 MED ORDER — VANCOMYCIN HCL IN DEXTROSE 1-5 GM/200ML-% IV SOLN
1000.0000 mg | INTRAVENOUS | Status: AC
  Administered 2021-06-15: 14:00:00 1000 mg via INTRAVENOUS

## 2021-06-15 MED ORDER — ACETAMINOPHEN 325 MG PO TABS: 325.0000 mg | ORAL_TABLET | Freq: Four times a day (QID) | ORAL | Status: DC | PRN

## 2021-06-15 MED ORDER — PROTAMINE SULFATE 10 MG/ML IV SOLN
INTRAVENOUS | Status: AC
  Filled 2021-06-15: qty 5

## 2021-06-15 MED ORDER — ONDANSETRON HCL 4 MG/2ML IJ SOLN: 4.0000 mg | Freq: Once | INTRAMUSCULAR | Status: DC | PRN

## 2021-06-15 MED ORDER — ACETAMINOPHEN 10 MG/ML IV SOLN
INTRAVENOUS | Status: AC
  Filled 2021-06-15: qty 100

## 2021-06-15 MED ORDER — ONDANSETRON HCL 4 MG/2ML IJ SOLN
INTRAMUSCULAR | Status: AC
  Filled 2021-06-15: qty 2

## 2021-06-15 MED ORDER — PROTAMINE SULFATE 10 MG/ML IV SOLN
INTRAVENOUS | Status: AC
  Filled 2021-06-15: qty 25

## 2021-06-15 MED ORDER — PHENYLEPHRINE 40 MCG/ML (10ML) SYRINGE FOR IV PUSH (FOR BLOOD PRESSURE SUPPORT)
PREFILLED_SYRINGE | INTRAVENOUS | Status: DC | PRN
  Administered 2021-06-15: 120 ug via INTRAVENOUS

## 2021-06-15 MED ORDER — 0.9 % SODIUM CHLORIDE (POUR BTL) OPTIME
TOPICAL | Status: DC | PRN
  Administered 2021-06-15: 14:00:00 1000 mL

## 2021-06-15 MED ORDER — CHLORHEXIDINE GLUCONATE 0.12 % MT SOLN
OROMUCOSAL | Status: AC
  Administered 2021-06-15: 13:00:00 15 mL via OROMUCOSAL
  Filled 2021-06-15: qty 15

## 2021-06-15 MED ORDER — LACTATED RINGERS IV SOLN: INTRAVENOUS | Status: DC

## 2021-06-15 MED ORDER — VANCOMYCIN HCL IN DEXTROSE 1-5 GM/200ML-% IV SOLN
INTRAVENOUS | Status: AC
  Filled 2021-06-15: qty 200

## 2021-06-15 MED ORDER — FENTANYL CITRATE (PF) 100 MCG/2ML IJ SOLN
INTRAMUSCULAR | Status: AC
  Filled 2021-06-15: qty 2

## 2021-06-15 MED ORDER — ACETAMINOPHEN 10 MG/ML IV SOLN
1000.0000 mg | Freq: Once | INTRAVENOUS | Status: DC | PRN
  Administered 2021-06-15: 15:00:00 1000 mg via INTRAVENOUS

## 2021-06-15 MED ORDER — MORPHINE SULFATE 15 MG PO TABS: 15.0000 mg | ORAL_TABLET | Freq: Two times a day (BID) | ORAL | Status: DC | PRN

## 2021-06-15 MED ORDER — PHENYLEPHRINE 40 MCG/ML (10ML) SYRINGE FOR IV PUSH (FOR BLOOD PRESSURE SUPPORT)
PREFILLED_SYRINGE | INTRAVENOUS | Status: AC
  Filled 2021-06-15: qty 10

## 2021-06-15 MED ORDER — FENTANYL CITRATE (PF) 100 MCG/2ML IJ SOLN: 25.0000 ug | INTRAMUSCULAR | Status: DC | PRN

## 2021-06-15 MED ORDER — LIDOCAINE 2% (20 MG/ML) 5 ML SYRINGE
INTRAMUSCULAR | Status: AC
  Filled 2021-06-15: qty 5

## 2021-06-15 MED ORDER — LIDOCAINE 2% (20 MG/ML) 5 ML SYRINGE
INTRAMUSCULAR | Status: DC | PRN
  Administered 2021-06-15: 60 mg via INTRAVENOUS

## 2021-06-15 MED ORDER — DEXAMETHASONE SODIUM PHOSPHATE 10 MG/ML IJ SOLN
INTRAMUSCULAR | Status: AC
  Filled 2021-06-15: qty 1

## 2021-06-15 MED ORDER — FENTANYL CITRATE (PF) 250 MCG/5ML IJ SOLN
INTRAMUSCULAR | Status: DC | PRN
  Administered 2021-06-15: 25 ug via INTRAVENOUS

## 2021-06-15 MED ORDER — FENTANYL CITRATE (PF) 250 MCG/5ML IJ SOLN
INTRAMUSCULAR | Status: AC
  Filled 2021-06-15: qty 5

## 2021-06-15 MED ORDER — BUPIVACAINE HCL 0.5 % IJ SOLN
INTRAMUSCULAR | Status: DC | PRN
  Administered 2021-06-15: 10 mL

## 2021-06-15 SURGICAL SUPPLY — 31 items
BLADE CLIPPER SURG (BLADE) IMPLANT
CANISTER SUCT 3000ML PPV (MISCELLANEOUS) ×2 IMPLANT
COVER SURGICAL LIGHT HANDLE (MISCELLANEOUS) ×2 IMPLANT
DERMABOND ADVANCED (GAUZE/BANDAGES/DRESSINGS) ×1
DERMABOND ADVANCED .7 DNX12 (GAUZE/BANDAGES/DRESSINGS) ×1 IMPLANT
DRAPE C-ARM 42X72 X-RAY (DRAPES) ×2 IMPLANT
DRAPE LAPAROSCOPIC ABDOMINAL (DRAPES) ×2 IMPLANT
GAUZE 4X4 16PLY ~~LOC~~+RFID DBL (SPONGE) ×2 IMPLANT
GLOVE SURG MICRO LTX SZ6 (GLOVE) ×4 IMPLANT
GLOVE SURG MICRO LTX SZ7.5 (GLOVE) ×2 IMPLANT
GLOVE SURG SIGNA 7.5 PF LTX (GLOVE) IMPLANT
GOWN STRL REUS W/ TWL LRG LVL3 (GOWN DISPOSABLE) ×2 IMPLANT
GOWN STRL REUS W/ TWL XL LVL3 (GOWN DISPOSABLE) ×1 IMPLANT
GOWN STRL REUS W/TWL LRG LVL3 (GOWN DISPOSABLE) ×4
GOWN STRL REUS W/TWL XL LVL3 (GOWN DISPOSABLE) ×2
KIT BASIN OR (CUSTOM PROCEDURE TRAY) ×2 IMPLANT
KIT PLEURX DRAIN CATH 1000ML (MISCELLANEOUS) ×2 IMPLANT
KIT PLEURX DRAIN CATH 15.5FR (DRAIN) ×2 IMPLANT
KIT TURNOVER KIT B (KITS) ×2 IMPLANT
NS IRRIG 1000ML POUR BTL (IV SOLUTION) ×2 IMPLANT
PACK GENERAL/GYN (CUSTOM PROCEDURE TRAY) ×2 IMPLANT
PAD ARMBOARD 7.5X6 YLW CONV (MISCELLANEOUS) ×4 IMPLANT
SET DRAINAGE LINE (MISCELLANEOUS) IMPLANT
SPONGE T-LAP 18X18 ~~LOC~~+RFID (SPONGE) ×2 IMPLANT
SUT ETHILON 3 0 FSL (SUTURE) ×4 IMPLANT
SUT VIC AB 3-0 X1 27 (SUTURE) ×4 IMPLANT
SYR CONTROL 10ML LL (SYRINGE) ×2 IMPLANT
TOWEL GREEN STERILE (TOWEL DISPOSABLE) ×2 IMPLANT
TOWEL GREEN STERILE FF (TOWEL DISPOSABLE) ×2 IMPLANT
VALVE REPLACEMENT CAP (MISCELLANEOUS) IMPLANT
WATER STERILE IRR 1000ML POUR (IV SOLUTION) ×2 IMPLANT

## 2021-06-15 NOTE — Op Note (Signed)
NAME: [name], [name] MEDICAL RECORD NO: [id number] ACCOUNT NO: [id number] DATE OF BIRTH: 1940-10-08 FACILITY: [facility] LOCATION: [facility] PHYSICIAN: Revonda Standard. Roxan Hockey, MD  Operative Report   DATE OF PROCEDURE: 06/15/2021  PREOPERATIVE DIAGNOSIS:  Malignant left pleural effusion.  POSTOPERATIVE DIAGNOSIS:  Malignant left pleural effusion.  PROCEDURE:  Left pleural catheter placement.  SURGEON:  Revonda Standard. Roxan Hockey, MD  ASSISTANT:  None.  ANESTHESIA:  Local with intravenous sedation.  FINDINGS:  Catheter in good position, 1.4 liters of serous fluid evacuated, near complete resolution of effusion post-drainage.  CLINICAL NOTE: [name] is an 80 year old woman with a complex medical history including stage IV lung cancer.  She has a recurrent left pleural effusion.  Cytology of the pleural fluid was positive for metastatic adenocarcinoma.  She has had  multiple thoracenteses, but has very brief relief.  She was advised to have a pleural catheter placed for palliative management of her malignant pleural effusion.  The indications, risks, benefits, and alternatives were discussed in detail with the  patient and her granddaughter.  They understand she will have an indwelling catheter that will need ongoing care.  DESCRIPTION OF PROCEDURE: [name] was brought to the operating room on 06/15/2021.  She was given intravenous sedation and monitored by the anesthesia service.  She was given intravenous antibiotics. Sequential compression devices were placed on the  calves for DVT prophylaxis. The chest and upper abdomen were prepped and draped in the usual sterile fashion.  A timeout was performed.  The site selected for catheter insertion was marked and then fluoroscopy confirmed that there was pleural fluid present at that level. The insertion site, catheter exit site, and the tract in between were then anesthetized with  10 mL of 0.5% bupivacaine.  After verifying adequate local  anesthetic effect, an incision was made at the insertion site.  The pleural effusion then was accessed with modified Seldinger technique with a needle passed through the incision.  A wire was  advanced into the chest.  Fluoroscopy confirmed positioning of the wire.  There was return of clear yellow fluid.  An incision then was made at the catheter exit site in the left upper quadrant.  The catheter was tunneled from the exit site to the  insertion incision.  The cuff was left just under the skin at the exit site.  The peel-away sheath introducer was placed over the wire.  The catheter was advanced into the chest.  The peel-away sheath introducer was removed.  The catheter was placed to  suction, and a total of 1400 mL of serous fluid was evacuated.  Fluoroscopy confirmed position of the catheter within the chest. The catheter was secured at the exit site with a 3-0 nylon suture.  The incision at the insertion site was closed with a 3-0  Vicryl subcuticular suture.  After completion of the drainage, there was a small amount of blood within the catheter.  The catheter was capped off. Fluoroscopy showed near complete resolution of the pleural effusion.  Dermabond was applied to the  incision.  The dressing then was applied in standard fashion.  The patient was taken from the operating room to the postanesthetic care unit in good condition.  All sponge, needle, and instrument counts were correct at the end of the procedure.   ROH D: 06/15/2021 5:37:29 pm T: 06/15/2021 10:57:00 pm  JOB: [id number]/ [id number]

## 2021-06-15 NOTE — Anesthesia Procedure Notes (Signed)
Procedure Name: MAC Date/Time: 06/15/2021 2:09 PM Performed by: Imagene Riches, CRNA Pre-anesthesia Checklist: Patient identified, Emergency Drugs available, Suction available, Patient being monitored and Timeout performed Patient Re-evaluated:Patient Re-evaluated prior to induction Oxygen Delivery Method: Simple face mask

## 2021-06-15 NOTE — Discharge Instructions (Addendum)
Do not drive or engage in heavy physical activity for 24 hours  You may shower tomorrow. Leave dressing in place when you shower.  You have a prescription for MSIR, a short acting morphine. You may take one tablet twice daily if needed to supplement your long acting morphine.  You may use acetaminophen (Tylenol) in addition to or instead of the prescription pain medication.  There is a medical adhesive over the incision. It will begin to peel off in about 2 weeks.  My office will contact you with a follow up appointment.  Call [phone] if you develop chest pain, shortness of breath, fever > 101, drainage from the incision or redness around the incision.  It is expected to have some discomfort the first few times the catheter is drained.  We will arrange a home health nurse to assist with catheter care and drainage.

## 2021-06-15 NOTE — Brief Op Note (Signed)
06/15/2021  2:39 PM  PATIENT:  [name]  80 y.o. female  PRE-OPERATIVE DIAGNOSIS:  MALIGNANT LEFT PLEURAL EFFUSION  POST-OPERATIVE DIAGNOSIS:  MALIGNANT LEFT PLEURAL EFFUSION  PROCEDURE:  Procedure(s): INSERTION PLEURAL DRAINAGE CATHETER (Left)  SURGEON:  Surgeon(s) and Role:    [name], MD - Primary  PHYSICIAN ASSISTANT:   ASSISTANTS: none   ANESTHESIA:   local and IV sedation  EBL:  minimal   BLOOD ADMINISTERED:none  DRAINS:  Pleural catheter    LOCAL MEDICATIONS USED:  BUPIVICAINE  and Amount: 10 ml  SPECIMEN:  Source of Specimen:  pleural fluid  DISPOSITION OF SPECIMEN:  N/A  COUNTS:  YES  TOURNIQUET:  * No tourniquets in log *  DICTATION: .Other Dictation: Dictation Number -  PLAN OF CARE: Discharge to home after PACU  PATIENT DISPOSITION:  PACU - hemodynamically stable.   Delay start of Pharmacological VTE agent (>24hrs) due to surgical blood loss or risk of bleeding: not applicable

## 2021-06-15 NOTE — Interval H&P Note (Signed)
History and Physical Interval Note:  06/15/2021 12:57 PM  [name]  has presented today for surgery, with the diagnosis of MALIGNANT LEFT PLEURAL EFFUSION.  The various methods of treatment have been discussed with the patient and family. After consideration of risks, benefits and other options for treatment, the patient has consented to  Procedure(s): INSERTION PLEURAL DRAINAGE CATHETER (Left) as a surgical intervention.  The patient's history has been reviewed, patient examined, no change in status, stable for surgery.  I have reviewed the patient's chart and labs.  Questions were answered to the patient's satisfaction.     [name]

## 2021-06-15 NOTE — Transfer of Care (Signed)
Immediate Anesthesia Transfer of Care Note  Patient: DEIDRA SPEASE  Procedure(s) Performed: INSERTION PLEURAL DRAINAGE CATHETER (Left)  Patient Location: PACU  Anesthesia Type:General  Level of Consciousness: drowsy  Airway & Oxygen Therapy: Patient Spontanous Breathing and Patient connected to nasal cannula oxygen  Post-op Assessment: Report given to RN and Post -op Vital signs reviewed and stable  Post vital signs: Reviewed and stable  Last Vitals:  Vitals Value Taken Time  BP 139/45 06/15/21 1455  Temp 36.4 C 06/15/21 1455  Pulse 64 06/15/21 1457  Resp 19 06/15/21 1457  SpO2 94 % 06/15/21 1457  Vitals shown include unvalidated device data.  Last Pain:  Vitals:   06/15/21 1320  TempSrc:   PainSc: 7       Patients Stated Pain Goal: 2 (60/45/40 9811)  Complications: No notable events documented.

## 2021-06-15 NOTE — Telephone Encounter (Signed)
ED RNCM faxed in [facility] referral to [facility], who patient is active with. [facility] services for Pleurx Cath Management. ED CM will follow up with [facility] with [facility] tomorrow

## 2021-06-15 NOTE — Anesthesia Postprocedure Evaluation (Signed)
Anesthesia Post Note  Patient: [name]  Procedure(s) Performed: INSERTION PLEURAL DRAINAGE CATHETER (Left)     Patient location during evaluation: PACU Anesthesia Type: MAC Level of consciousness: awake and alert Pain management: pain level controlled Vital Signs Assessment: post-procedure vital signs reviewed and stable Respiratory status: spontaneous breathing, nonlabored ventilation, respiratory function stable and patient connected to nasal cannula oxygen Cardiovascular status: stable and blood pressure returned to baseline Postop Assessment: no apparent nausea or vomiting Anesthetic complications: no   No notable events documented.  Last Vitals:  Vitals:   06/15/21 1455 06/15/21 1510  BP: (!) 139/45 (!) 133/53  Pulse: 63 61  Resp: 15 (!) 24  Temp: 36.4 C   SpO2: 95% 92%    Last Pain:  Vitals:   06/15/21 1455  TempSrc:   PainSc: Asleep                 [name] S

## 2021-06-16 ENCOUNTER — Telehealth: Payer: Self-pay | Admitting: Internal Medicine

## 2021-06-16 ENCOUNTER — Telehealth: Payer: Self-pay | Admitting: Surgery

## 2021-06-16 ENCOUNTER — Encounter (HOSPITAL_COMMUNITY): Payer: Self-pay | Admitting: Thoracic Surgery (Cardiothoracic Vascular Surgery)

## 2021-06-16 DIAGNOSIS — M85832 Other specified disorders of bone density and structure, left forearm: Secondary | ICD-10-CM | POA: Diagnosis not present

## 2021-06-16 DIAGNOSIS — M1030 Gout due to renal impairment, unspecified site: Secondary | ICD-10-CM | POA: Diagnosis not present

## 2021-06-16 DIAGNOSIS — M810 Age-related osteoporosis without current pathological fracture: Secondary | ICD-10-CM | POA: Diagnosis not present

## 2021-06-16 DIAGNOSIS — D631 Anemia in chronic kidney disease: Secondary | ICD-10-CM | POA: Diagnosis not present

## 2021-06-16 DIAGNOSIS — K219 Gastro-esophageal reflux disease without esophagitis: Secondary | ICD-10-CM | POA: Diagnosis not present

## 2021-06-16 DIAGNOSIS — D519 Vitamin B12 deficiency anemia, unspecified: Secondary | ICD-10-CM | POA: Diagnosis not present

## 2021-06-16 DIAGNOSIS — J918 Pleural effusion in other conditions classified elsewhere: Secondary | ICD-10-CM | POA: Diagnosis not present

## 2021-06-16 DIAGNOSIS — M4802 Spinal stenosis, cervical region: Secondary | ICD-10-CM | POA: Diagnosis not present

## 2021-06-16 DIAGNOSIS — M19072 Primary osteoarthritis, left ankle and foot: Secondary | ICD-10-CM | POA: Diagnosis not present

## 2021-06-16 DIAGNOSIS — U099 Post covid-19 condition, unspecified: Secondary | ICD-10-CM | POA: Diagnosis not present

## 2021-06-16 DIAGNOSIS — I130 Hypertensive heart and chronic kidney disease with heart failure and stage 1 through stage 4 chronic kidney disease, or unspecified chronic kidney disease: Secondary | ICD-10-CM | POA: Diagnosis not present

## 2021-06-16 DIAGNOSIS — M19071 Primary osteoarthritis, right ankle and foot: Secondary | ICD-10-CM | POA: Diagnosis not present

## 2021-06-16 DIAGNOSIS — E785 Hyperlipidemia, unspecified: Secondary | ICD-10-CM | POA: Diagnosis not present

## 2021-06-16 DIAGNOSIS — J449 Chronic obstructive pulmonary disease, unspecified: Secondary | ICD-10-CM | POA: Diagnosis not present

## 2021-06-16 DIAGNOSIS — H539 Unspecified visual disturbance: Secondary | ICD-10-CM | POA: Diagnosis not present

## 2021-06-16 DIAGNOSIS — G4733 Obstructive sleep apnea (adult) (pediatric): Secondary | ICD-10-CM | POA: Diagnosis not present

## 2021-06-16 DIAGNOSIS — D472 Monoclonal gammopathy: Secondary | ICD-10-CM | POA: Diagnosis not present

## 2021-06-16 DIAGNOSIS — I5042 Chronic combined systolic (congestive) and diastolic (congestive) heart failure: Secondary | ICD-10-CM | POA: Diagnosis not present

## 2021-06-16 DIAGNOSIS — N1832 Chronic kidney disease, stage 3b: Secondary | ICD-10-CM | POA: Diagnosis not present

## 2021-06-16 DIAGNOSIS — I2781 Cor pulmonale (chronic): Secondary | ICD-10-CM | POA: Diagnosis not present

## 2021-06-16 DIAGNOSIS — D630 Anemia in neoplastic disease: Secondary | ICD-10-CM | POA: Diagnosis not present

## 2021-06-16 DIAGNOSIS — M479 Spondylosis, unspecified: Secondary | ICD-10-CM | POA: Diagnosis not present

## 2021-06-16 DIAGNOSIS — J9621 Acute and chronic respiratory failure with hypoxia: Secondary | ICD-10-CM | POA: Diagnosis not present

## 2021-06-16 DIAGNOSIS — I878 Other specified disorders of veins: Secondary | ICD-10-CM | POA: Diagnosis not present

## 2021-06-16 DIAGNOSIS — I700 Atherosclerosis of aorta: Secondary | ICD-10-CM | POA: Diagnosis not present

## 2021-06-16 NOTE — Telephone Encounter (Signed)
Sch per 11/10 los, pt aware

## 2021-06-16 NOTE — Telephone Encounter (Signed)
ED RNCM contacted [facility] liaison Georgina Snell to confirm the receipt of [place] orders for Management of Pleurx Cath Management. [facility] aware.  No further RNCM needs identified.

## 2021-06-17 DIAGNOSIS — D472 Monoclonal gammopathy: Secondary | ICD-10-CM | POA: Diagnosis not present

## 2021-06-17 DIAGNOSIS — D630 Anemia in neoplastic disease: Secondary | ICD-10-CM | POA: Diagnosis not present

## 2021-06-17 DIAGNOSIS — D631 Anemia in chronic kidney disease: Secondary | ICD-10-CM | POA: Diagnosis not present

## 2021-06-17 DIAGNOSIS — M19072 Primary osteoarthritis, left ankle and foot: Secondary | ICD-10-CM | POA: Diagnosis not present

## 2021-06-17 DIAGNOSIS — D519 Vitamin B12 deficiency anemia, unspecified: Secondary | ICD-10-CM | POA: Diagnosis not present

## 2021-06-17 DIAGNOSIS — J449 Chronic obstructive pulmonary disease, unspecified: Secondary | ICD-10-CM | POA: Diagnosis not present

## 2021-06-17 DIAGNOSIS — H539 Unspecified visual disturbance: Secondary | ICD-10-CM | POA: Diagnosis not present

## 2021-06-17 DIAGNOSIS — J9621 Acute and chronic respiratory failure with hypoxia: Secondary | ICD-10-CM | POA: Diagnosis not present

## 2021-06-17 DIAGNOSIS — M479 Spondylosis, unspecified: Secondary | ICD-10-CM | POA: Diagnosis not present

## 2021-06-17 DIAGNOSIS — M810 Age-related osteoporosis without current pathological fracture: Secondary | ICD-10-CM | POA: Diagnosis not present

## 2021-06-17 DIAGNOSIS — U099 Post covid-19 condition, unspecified: Secondary | ICD-10-CM | POA: Diagnosis not present

## 2021-06-17 DIAGNOSIS — E785 Hyperlipidemia, unspecified: Secondary | ICD-10-CM | POA: Diagnosis not present

## 2021-06-17 DIAGNOSIS — I130 Hypertensive heart and chronic kidney disease with heart failure and stage 1 through stage 4 chronic kidney disease, or unspecified chronic kidney disease: Secondary | ICD-10-CM | POA: Diagnosis not present

## 2021-06-17 DIAGNOSIS — M1030 Gout due to renal impairment, unspecified site: Secondary | ICD-10-CM | POA: Diagnosis not present

## 2021-06-17 DIAGNOSIS — J918 Pleural effusion in other conditions classified elsewhere: Secondary | ICD-10-CM | POA: Diagnosis not present

## 2021-06-17 DIAGNOSIS — I2781 Cor pulmonale (chronic): Secondary | ICD-10-CM | POA: Diagnosis not present

## 2021-06-17 DIAGNOSIS — M4802 Spinal stenosis, cervical region: Secondary | ICD-10-CM | POA: Diagnosis not present

## 2021-06-17 DIAGNOSIS — K219 Gastro-esophageal reflux disease without esophagitis: Secondary | ICD-10-CM | POA: Diagnosis not present

## 2021-06-17 DIAGNOSIS — N1832 Chronic kidney disease, stage 3b: Secondary | ICD-10-CM | POA: Diagnosis not present

## 2021-06-17 DIAGNOSIS — M85832 Other specified disorders of bone density and structure, left forearm: Secondary | ICD-10-CM | POA: Diagnosis not present

## 2021-06-17 DIAGNOSIS — I700 Atherosclerosis of aorta: Secondary | ICD-10-CM | POA: Diagnosis not present

## 2021-06-17 DIAGNOSIS — M19071 Primary osteoarthritis, right ankle and foot: Secondary | ICD-10-CM | POA: Diagnosis not present

## 2021-06-17 DIAGNOSIS — I878 Other specified disorders of veins: Secondary | ICD-10-CM | POA: Diagnosis not present

## 2021-06-17 DIAGNOSIS — G4733 Obstructive sleep apnea (adult) (pediatric): Secondary | ICD-10-CM | POA: Diagnosis not present

## 2021-06-17 DIAGNOSIS — I5042 Chronic combined systolic (congestive) and diastolic (congestive) heart failure: Secondary | ICD-10-CM | POA: Diagnosis not present

## 2021-06-18 DIAGNOSIS — H539 Unspecified visual disturbance: Secondary | ICD-10-CM | POA: Diagnosis not present

## 2021-06-18 DIAGNOSIS — M19071 Primary osteoarthritis, right ankle and foot: Secondary | ICD-10-CM | POA: Diagnosis not present

## 2021-06-18 DIAGNOSIS — I130 Hypertensive heart and chronic kidney disease with heart failure and stage 1 through stage 4 chronic kidney disease, or unspecified chronic kidney disease: Secondary | ICD-10-CM | POA: Diagnosis not present

## 2021-06-18 DIAGNOSIS — G4733 Obstructive sleep apnea (adult) (pediatric): Secondary | ICD-10-CM | POA: Diagnosis not present

## 2021-06-18 DIAGNOSIS — J918 Pleural effusion in other conditions classified elsewhere: Secondary | ICD-10-CM | POA: Diagnosis not present

## 2021-06-18 DIAGNOSIS — U099 Post covid-19 condition, unspecified: Secondary | ICD-10-CM | POA: Diagnosis not present

## 2021-06-18 DIAGNOSIS — I5042 Chronic combined systolic (congestive) and diastolic (congestive) heart failure: Secondary | ICD-10-CM | POA: Diagnosis not present

## 2021-06-18 DIAGNOSIS — I2781 Cor pulmonale (chronic): Secondary | ICD-10-CM | POA: Diagnosis not present

## 2021-06-18 DIAGNOSIS — N1832 Chronic kidney disease, stage 3b: Secondary | ICD-10-CM | POA: Diagnosis not present

## 2021-06-18 DIAGNOSIS — D630 Anemia in neoplastic disease: Secondary | ICD-10-CM | POA: Diagnosis not present

## 2021-06-18 DIAGNOSIS — D519 Vitamin B12 deficiency anemia, unspecified: Secondary | ICD-10-CM | POA: Diagnosis not present

## 2021-06-18 DIAGNOSIS — M1030 Gout due to renal impairment, unspecified site: Secondary | ICD-10-CM | POA: Diagnosis not present

## 2021-06-18 DIAGNOSIS — I878 Other specified disorders of veins: Secondary | ICD-10-CM | POA: Diagnosis not present

## 2021-06-18 DIAGNOSIS — M479 Spondylosis, unspecified: Secondary | ICD-10-CM | POA: Diagnosis not present

## 2021-06-18 DIAGNOSIS — D472 Monoclonal gammopathy: Secondary | ICD-10-CM | POA: Diagnosis not present

## 2021-06-18 DIAGNOSIS — M85832 Other specified disorders of bone density and structure, left forearm: Secondary | ICD-10-CM | POA: Diagnosis not present

## 2021-06-18 DIAGNOSIS — I700 Atherosclerosis of aorta: Secondary | ICD-10-CM | POA: Diagnosis not present

## 2021-06-18 DIAGNOSIS — E785 Hyperlipidemia, unspecified: Secondary | ICD-10-CM | POA: Diagnosis not present

## 2021-06-18 DIAGNOSIS — M19072 Primary osteoarthritis, left ankle and foot: Secondary | ICD-10-CM | POA: Diagnosis not present

## 2021-06-18 DIAGNOSIS — M810 Age-related osteoporosis without current pathological fracture: Secondary | ICD-10-CM | POA: Diagnosis not present

## 2021-06-18 DIAGNOSIS — D631 Anemia in chronic kidney disease: Secondary | ICD-10-CM | POA: Diagnosis not present

## 2021-06-18 DIAGNOSIS — J9621 Acute and chronic respiratory failure with hypoxia: Secondary | ICD-10-CM | POA: Diagnosis not present

## 2021-06-18 DIAGNOSIS — J449 Chronic obstructive pulmonary disease, unspecified: Secondary | ICD-10-CM | POA: Diagnosis not present

## 2021-06-18 DIAGNOSIS — K219 Gastro-esophageal reflux disease without esophagitis: Secondary | ICD-10-CM | POA: Diagnosis not present

## 2021-06-18 DIAGNOSIS — M4802 Spinal stenosis, cervical region: Secondary | ICD-10-CM | POA: Diagnosis not present

## 2021-06-19 ENCOUNTER — Telehealth (HOSPITAL_COMMUNITY): Payer: Self-pay | Admitting: Dietician

## 2021-06-19 ENCOUNTER — Telehealth: Payer: Self-pay | Admitting: Internal Medicine

## 2021-06-19 ENCOUNTER — Encounter (HOSPITAL_COMMUNITY): Payer: Medicare Other | Admitting: Dietician

## 2021-06-19 DIAGNOSIS — D630 Anemia in neoplastic disease: Secondary | ICD-10-CM | POA: Diagnosis not present

## 2021-06-19 DIAGNOSIS — I130 Hypertensive heart and chronic kidney disease with heart failure and stage 1 through stage 4 chronic kidney disease, or unspecified chronic kidney disease: Secondary | ICD-10-CM | POA: Diagnosis not present

## 2021-06-19 DIAGNOSIS — D519 Vitamin B12 deficiency anemia, unspecified: Secondary | ICD-10-CM | POA: Diagnosis not present

## 2021-06-19 DIAGNOSIS — N1832 Chronic kidney disease, stage 3b: Secondary | ICD-10-CM | POA: Diagnosis not present

## 2021-06-19 DIAGNOSIS — M1030 Gout due to renal impairment, unspecified site: Secondary | ICD-10-CM | POA: Diagnosis not present

## 2021-06-19 DIAGNOSIS — M4802 Spinal stenosis, cervical region: Secondary | ICD-10-CM | POA: Diagnosis not present

## 2021-06-19 DIAGNOSIS — M810 Age-related osteoporosis without current pathological fracture: Secondary | ICD-10-CM | POA: Diagnosis not present

## 2021-06-19 DIAGNOSIS — J918 Pleural effusion in other conditions classified elsewhere: Secondary | ICD-10-CM | POA: Diagnosis not present

## 2021-06-19 DIAGNOSIS — M85832 Other specified disorders of bone density and structure, left forearm: Secondary | ICD-10-CM | POA: Diagnosis not present

## 2021-06-19 DIAGNOSIS — I2781 Cor pulmonale (chronic): Secondary | ICD-10-CM | POA: Diagnosis not present

## 2021-06-19 DIAGNOSIS — I700 Atherosclerosis of aorta: Secondary | ICD-10-CM | POA: Diagnosis not present

## 2021-06-19 DIAGNOSIS — J9621 Acute and chronic respiratory failure with hypoxia: Secondary | ICD-10-CM | POA: Diagnosis not present

## 2021-06-19 DIAGNOSIS — M19072 Primary osteoarthritis, left ankle and foot: Secondary | ICD-10-CM | POA: Diagnosis not present

## 2021-06-19 DIAGNOSIS — I5042 Chronic combined systolic (congestive) and diastolic (congestive) heart failure: Secondary | ICD-10-CM | POA: Diagnosis not present

## 2021-06-19 DIAGNOSIS — J449 Chronic obstructive pulmonary disease, unspecified: Secondary | ICD-10-CM | POA: Diagnosis not present

## 2021-06-19 DIAGNOSIS — I878 Other specified disorders of veins: Secondary | ICD-10-CM | POA: Diagnosis not present

## 2021-06-19 DIAGNOSIS — K219 Gastro-esophageal reflux disease without esophagitis: Secondary | ICD-10-CM | POA: Diagnosis not present

## 2021-06-19 DIAGNOSIS — D472 Monoclonal gammopathy: Secondary | ICD-10-CM | POA: Diagnosis not present

## 2021-06-19 DIAGNOSIS — U099 Post covid-19 condition, unspecified: Secondary | ICD-10-CM | POA: Diagnosis not present

## 2021-06-19 DIAGNOSIS — E785 Hyperlipidemia, unspecified: Secondary | ICD-10-CM | POA: Diagnosis not present

## 2021-06-19 DIAGNOSIS — H539 Unspecified visual disturbance: Secondary | ICD-10-CM | POA: Diagnosis not present

## 2021-06-19 DIAGNOSIS — M19071 Primary osteoarthritis, right ankle and foot: Secondary | ICD-10-CM | POA: Diagnosis not present

## 2021-06-19 DIAGNOSIS — G4733 Obstructive sleep apnea (adult) (pediatric): Secondary | ICD-10-CM | POA: Diagnosis not present

## 2021-06-19 DIAGNOSIS — D631 Anemia in chronic kidney disease: Secondary | ICD-10-CM | POA: Diagnosis not present

## 2021-06-19 DIAGNOSIS — M479 Spondylosis, unspecified: Secondary | ICD-10-CM | POA: Diagnosis not present

## 2021-06-19 NOTE — Telephone Encounter (Signed)
Nutrition Assessment   Reason for Assessment: MST (+poor appetite, wt loss)   ASSESSMENT: 80 year old female with stage IV non-small cell lung cancer. She presented with a left primary lung lesion and widespread metastatic disease in the thoracic nodes, left pleural space, abdomen pelvic lymph nodes, and bones. Patient is s/p Pleurx catheter placement on 11/17 for large left pleural effusion. Patient is currently receiving targeted therapy with Tagrisso. Patient is followed by Dr. Julien Nordmann.   Past medical history includes CHF, COPD, O2 dependent, arthritis, GERD, HTN, OSA, HLD, vit D deficiency, B12 deficiency, MGUS, morbid obesity, UC, angiodysplasia of cecum and duodenum.  Spoke with patient via telephone. Introduced self at services available at [facility]. Patient appreciative of call and agreeable to telephone visit. She reports she has lost a lot of weight since hospitalization and surgery for hernia in January 2022. She recalls usual weight around 195 lb. Patient reports she is drinking a lot of water lately. States she does not know why, but has not had much of an appetite over the past few weeks. She reports the most she has eaten recently was a steak and cheese with salad. Patient had a piece of toast with jelly this morning. She likes the [employer] and is drinking these, but not everyday. She recalls drinking a lot Ensure during last hospitalization. Patient reports intermittent nausea, she denies vomiting.     Nutrition Focused Physical Exam: unable to complete   Medications: demadex, B12, ferrous sulfate, MS contin, compazine    Labs: Glucose 108, BUN 40, Cr 1.37   Anthropometrics:   Height: 5" Weight: 174 lb (11/17) UBW: 195 lb (per pt) BMI: 33.98    NUTRITION DIAGNOSIS: Unintentional weight loss related to cancer as evidenced by reported poor appetite and ~21 lb (10.8%) decrease from usual weight in the last year. This is insignificant for time frame, however concerning  given advanced age and chronic comorbidities.   INTERVENTION:  Educated on eating small frequent meals and snacks with adequate calories and protein - provided suggestions, will mail snack ideas Discussed ways to add calories and protein to foods Encouraged drinking 2-3 oral nutrition supplements and recommended switching to Ensure Plus/equivalent for added calories - will mail coupons Will provide complimentary Ensure case on 11/29  Discussed strategies for nausea, foods best tolerated and foods to avoid - will mail handout Contact information provided    MONITORING, EVALUATION, GOAL: Patient will tolerate increased calories and protein to minimize weight loss   Next Visit: f/u Tuesday, November 29 after MD visit

## 2021-06-19 NOTE — Telephone Encounter (Signed)
Beth from [facility] has called and states she saw pt. Yesterday. She has a new pluetrx tube last week. States that they have been teaching care giver how to drain tube, but there has been excess drainage around insertion site, resulting in pinkness from moisture.    Pt. Family states pt. Has been more sleepy and foggy. May be due to clonazePAM (KLONOPIN) 1 MG tablet. Pt. Was also restless night before last. Reports still having pain and burning when urination, following hospitalization. May need a UA.

## 2021-06-20 ENCOUNTER — Telehealth: Payer: Self-pay | Admitting: Internal Medicine

## 2021-06-20 NOTE — Telephone Encounter (Signed)
Home Health verbal orders-caller/Agency: Denise/ [facility]  Callback number: [phone] option 2  Requesting OT/PT/Skilled nursing/Social Work/Speech: Palliative Care  Reason: Caller states home health services  recommends palliative care for patient

## 2021-06-20 NOTE — Telephone Encounter (Signed)
Called pt, LVM stating ok for verbal orders

## 2021-06-20 NOTE — Telephone Encounter (Signed)
Patient grand daughter Ander Purpura calling in  Patient recently started taking clonazePAM (KLONOPIN) 1 MG tablet 06/06/21   Says when patient takes 1 whole tablet at a time she gets extremely out of it (has trouble making conversation & talking... very drowsy)  Lauren says she started cutting pills in 1/2 to give to patient & she tends to tolerate that dosage level better  Wants a call back from nurse [phone]

## 2021-06-21 ENCOUNTER — Telehealth: Payer: Self-pay | Admitting: Internal Medicine

## 2021-06-21 DIAGNOSIS — D631 Anemia in chronic kidney disease: Secondary | ICD-10-CM | POA: Diagnosis not present

## 2021-06-21 DIAGNOSIS — I2781 Cor pulmonale (chronic): Secondary | ICD-10-CM | POA: Diagnosis not present

## 2021-06-21 DIAGNOSIS — D472 Monoclonal gammopathy: Secondary | ICD-10-CM | POA: Diagnosis not present

## 2021-06-21 DIAGNOSIS — I5042 Chronic combined systolic (congestive) and diastolic (congestive) heart failure: Secondary | ICD-10-CM | POA: Diagnosis not present

## 2021-06-21 DIAGNOSIS — D630 Anemia in neoplastic disease: Secondary | ICD-10-CM | POA: Diagnosis not present

## 2021-06-21 DIAGNOSIS — M810 Age-related osteoporosis without current pathological fracture: Secondary | ICD-10-CM | POA: Diagnosis not present

## 2021-06-21 DIAGNOSIS — N1832 Chronic kidney disease, stage 3b: Secondary | ICD-10-CM | POA: Diagnosis not present

## 2021-06-21 DIAGNOSIS — I130 Hypertensive heart and chronic kidney disease with heart failure and stage 1 through stage 4 chronic kidney disease, or unspecified chronic kidney disease: Secondary | ICD-10-CM | POA: Diagnosis not present

## 2021-06-21 DIAGNOSIS — M4802 Spinal stenosis, cervical region: Secondary | ICD-10-CM | POA: Diagnosis not present

## 2021-06-21 DIAGNOSIS — M85832 Other specified disorders of bone density and structure, left forearm: Secondary | ICD-10-CM | POA: Diagnosis not present

## 2021-06-21 DIAGNOSIS — E785 Hyperlipidemia, unspecified: Secondary | ICD-10-CM | POA: Diagnosis not present

## 2021-06-21 DIAGNOSIS — I878 Other specified disorders of veins: Secondary | ICD-10-CM | POA: Diagnosis not present

## 2021-06-21 DIAGNOSIS — M479 Spondylosis, unspecified: Secondary | ICD-10-CM | POA: Diagnosis not present

## 2021-06-21 DIAGNOSIS — J918 Pleural effusion in other conditions classified elsewhere: Secondary | ICD-10-CM | POA: Diagnosis not present

## 2021-06-21 DIAGNOSIS — U099 Post covid-19 condition, unspecified: Secondary | ICD-10-CM | POA: Diagnosis not present

## 2021-06-21 DIAGNOSIS — D519 Vitamin B12 deficiency anemia, unspecified: Secondary | ICD-10-CM | POA: Diagnosis not present

## 2021-06-21 DIAGNOSIS — J449 Chronic obstructive pulmonary disease, unspecified: Secondary | ICD-10-CM | POA: Diagnosis not present

## 2021-06-21 DIAGNOSIS — M19071 Primary osteoarthritis, right ankle and foot: Secondary | ICD-10-CM | POA: Diagnosis not present

## 2021-06-21 DIAGNOSIS — J9621 Acute and chronic respiratory failure with hypoxia: Secondary | ICD-10-CM | POA: Diagnosis not present

## 2021-06-21 DIAGNOSIS — Z933 Colostomy status: Secondary | ICD-10-CM | POA: Diagnosis not present

## 2021-06-21 DIAGNOSIS — I700 Atherosclerosis of aorta: Secondary | ICD-10-CM | POA: Diagnosis not present

## 2021-06-21 DIAGNOSIS — M19072 Primary osteoarthritis, left ankle and foot: Secondary | ICD-10-CM | POA: Diagnosis not present

## 2021-06-21 DIAGNOSIS — H539 Unspecified visual disturbance: Secondary | ICD-10-CM | POA: Diagnosis not present

## 2021-06-21 DIAGNOSIS — G4733 Obstructive sleep apnea (adult) (pediatric): Secondary | ICD-10-CM | POA: Diagnosis not present

## 2021-06-21 DIAGNOSIS — K219 Gastro-esophageal reflux disease without esophagitis: Secondary | ICD-10-CM | POA: Diagnosis not present

## 2021-06-21 DIAGNOSIS — M1030 Gout due to renal impairment, unspecified site: Secondary | ICD-10-CM | POA: Diagnosis not present

## 2021-06-21 NOTE — Telephone Encounter (Signed)
No CB# for Pamala Hurry so I contacted the pt in regard. I informed her that an OV would be needed for urine testing for medication and/or abx. She expressed understanding and scheduled an OV with Dr. Sharlet Salina for 11/19 @ 2.40pm.

## 2021-06-21 NOTE — Telephone Encounter (Signed)
Called pt, LVM.   

## 2021-06-21 NOTE — Telephone Encounter (Signed)
Pamala Hurry an RN from [facility] called to report pt has pain when urinating, unusual odor. Not like UTI. Requesting provider to prescribe a medication?   Pt. Requesting refill on topical cream. Used for skin rash on bottom- eczema or psoriasis. Did not see on med list.   Please advise.   [facility] [place], [place] AT [address] Phone:  [phone]  Fax:  [phone]

## 2021-06-23 DIAGNOSIS — J449 Chronic obstructive pulmonary disease, unspecified: Secondary | ICD-10-CM | POA: Diagnosis not present

## 2021-06-23 DIAGNOSIS — G4733 Obstructive sleep apnea (adult) (pediatric): Secondary | ICD-10-CM | POA: Diagnosis not present

## 2021-06-23 DIAGNOSIS — D472 Monoclonal gammopathy: Secondary | ICD-10-CM | POA: Diagnosis not present

## 2021-06-23 DIAGNOSIS — M4802 Spinal stenosis, cervical region: Secondary | ICD-10-CM | POA: Diagnosis not present

## 2021-06-23 DIAGNOSIS — I700 Atherosclerosis of aorta: Secondary | ICD-10-CM | POA: Diagnosis not present

## 2021-06-23 DIAGNOSIS — H539 Unspecified visual disturbance: Secondary | ICD-10-CM | POA: Diagnosis not present

## 2021-06-23 DIAGNOSIS — D631 Anemia in chronic kidney disease: Secondary | ICD-10-CM | POA: Diagnosis not present

## 2021-06-23 DIAGNOSIS — M19071 Primary osteoarthritis, right ankle and foot: Secondary | ICD-10-CM | POA: Diagnosis not present

## 2021-06-23 DIAGNOSIS — K219 Gastro-esophageal reflux disease without esophagitis: Secondary | ICD-10-CM | POA: Diagnosis not present

## 2021-06-23 DIAGNOSIS — N1832 Chronic kidney disease, stage 3b: Secondary | ICD-10-CM | POA: Diagnosis not present

## 2021-06-23 DIAGNOSIS — I2781 Cor pulmonale (chronic): Secondary | ICD-10-CM | POA: Diagnosis not present

## 2021-06-23 DIAGNOSIS — I130 Hypertensive heart and chronic kidney disease with heart failure and stage 1 through stage 4 chronic kidney disease, or unspecified chronic kidney disease: Secondary | ICD-10-CM | POA: Diagnosis not present

## 2021-06-23 DIAGNOSIS — J918 Pleural effusion in other conditions classified elsewhere: Secondary | ICD-10-CM | POA: Diagnosis not present

## 2021-06-23 DIAGNOSIS — M810 Age-related osteoporosis without current pathological fracture: Secondary | ICD-10-CM | POA: Diagnosis not present

## 2021-06-23 DIAGNOSIS — M1030 Gout due to renal impairment, unspecified site: Secondary | ICD-10-CM | POA: Diagnosis not present

## 2021-06-23 DIAGNOSIS — M479 Spondylosis, unspecified: Secondary | ICD-10-CM | POA: Diagnosis not present

## 2021-06-23 DIAGNOSIS — J9621 Acute and chronic respiratory failure with hypoxia: Secondary | ICD-10-CM | POA: Diagnosis not present

## 2021-06-23 DIAGNOSIS — I878 Other specified disorders of veins: Secondary | ICD-10-CM | POA: Diagnosis not present

## 2021-06-23 DIAGNOSIS — M85832 Other specified disorders of bone density and structure, left forearm: Secondary | ICD-10-CM | POA: Diagnosis not present

## 2021-06-23 DIAGNOSIS — U099 Post covid-19 condition, unspecified: Secondary | ICD-10-CM | POA: Diagnosis not present

## 2021-06-23 DIAGNOSIS — D630 Anemia in neoplastic disease: Secondary | ICD-10-CM | POA: Diagnosis not present

## 2021-06-23 DIAGNOSIS — I5042 Chronic combined systolic (congestive) and diastolic (congestive) heart failure: Secondary | ICD-10-CM | POA: Diagnosis not present

## 2021-06-23 DIAGNOSIS — D519 Vitamin B12 deficiency anemia, unspecified: Secondary | ICD-10-CM | POA: Diagnosis not present

## 2021-06-23 DIAGNOSIS — E785 Hyperlipidemia, unspecified: Secondary | ICD-10-CM | POA: Diagnosis not present

## 2021-06-23 DIAGNOSIS — M19072 Primary osteoarthritis, left ankle and foot: Secondary | ICD-10-CM | POA: Diagnosis not present

## 2021-06-26 ENCOUNTER — Telehealth: Payer: Self-pay

## 2021-06-26 NOTE — Telephone Encounter (Signed)
(  4:05 pm) SW contacted patient's granddaughter-Lauren to schedule an initial palliative care visit. Lauren stated that she was not home at the moment and would call SW back tomorrow to schedule visit.

## 2021-06-27 ENCOUNTER — Inpatient Hospital Stay: Payer: Medicare Other

## 2021-06-27 ENCOUNTER — Ambulatory Visit (INDEPENDENT_AMBULATORY_CARE_PROVIDER_SITE_OTHER): Payer: Medicare Other | Admitting: Internal Medicine

## 2021-06-27 ENCOUNTER — Encounter: Payer: Self-pay | Admitting: Internal Medicine

## 2021-06-27 ENCOUNTER — Other Ambulatory Visit: Payer: Self-pay | Admitting: Physician Assistant

## 2021-06-27 ENCOUNTER — Inpatient Hospital Stay (HOSPITAL_BASED_OUTPATIENT_CLINIC_OR_DEPARTMENT_OTHER): Payer: Medicare Other | Admitting: Internal Medicine

## 2021-06-27 ENCOUNTER — Inpatient Hospital Stay: Payer: Medicare Other | Admitting: Dietician

## 2021-06-27 ENCOUNTER — Other Ambulatory Visit: Payer: Self-pay

## 2021-06-27 VITALS — BP 128/84 | HR 67 | Resp 18 | Ht 60.0 in | Wt 172.0 lb

## 2021-06-27 VITALS — BP 99/54 | HR 64 | Temp 97.9°F | Resp 17 | Ht 60.0 in

## 2021-06-27 DIAGNOSIS — J90 Pleural effusion, not elsewhere classified: Secondary | ICD-10-CM

## 2021-06-27 DIAGNOSIS — C7989 Secondary malignant neoplasm of other specified sites: Secondary | ICD-10-CM | POA: Diagnosis not present

## 2021-06-27 DIAGNOSIS — R300 Dysuria: Secondary | ICD-10-CM | POA: Diagnosis not present

## 2021-06-27 DIAGNOSIS — C3492 Malignant neoplasm of unspecified part of left bronchus or lung: Secondary | ICD-10-CM | POA: Insufficient documentation

## 2021-06-27 DIAGNOSIS — I509 Heart failure, unspecified: Secondary | ICD-10-CM | POA: Diagnosis not present

## 2021-06-27 DIAGNOSIS — F69 Unspecified disorder of adult personality and behavior: Secondary | ICD-10-CM

## 2021-06-27 DIAGNOSIS — E876 Hypokalemia: Secondary | ICD-10-CM

## 2021-06-27 DIAGNOSIS — N2889 Other specified disorders of kidney and ureter: Secondary | ICD-10-CM | POA: Diagnosis not present

## 2021-06-27 DIAGNOSIS — C799 Secondary malignant neoplasm of unspecified site: Secondary | ICD-10-CM

## 2021-06-27 DIAGNOSIS — C779 Secondary and unspecified malignant neoplasm of lymph node, unspecified: Secondary | ICD-10-CM | POA: Diagnosis not present

## 2021-06-27 DIAGNOSIS — C7951 Secondary malignant neoplasm of bone: Secondary | ICD-10-CM | POA: Insufficient documentation

## 2021-06-27 DIAGNOSIS — I110 Hypertensive heart disease with heart failure: Secondary | ICD-10-CM | POA: Insufficient documentation

## 2021-06-27 LAB — CMP (CANCER CENTER ONLY)
ALT: 7 U/L (ref 0–44)
AST: 16 U/L (ref 15–41)
Albumin: 2.9 g/dL — ABNORMAL LOW (ref 3.5–5.0)
Alkaline Phosphatase: 49 U/L (ref 38–126)
Anion gap: 7 (ref 5–15)
BUN: 27 mg/dL — ABNORMAL HIGH (ref 8–23)
CO2: 31 mmol/L (ref 22–32)
Calcium: 9.1 mg/dL (ref 8.9–10.3)
Chloride: 98 mmol/L (ref 98–111)
Creatinine: 1.56 mg/dL — ABNORMAL HIGH (ref 0.44–1.00)
GFR, Estimated: 33 mL/min — ABNORMAL LOW (ref >60)
Glucose, Bld: 114 mg/dL — ABNORMAL HIGH (ref 70–99)
Potassium: 3.2 mmol/L — ABNORMAL LOW (ref 3.5–5.1)
Sodium: 136 mmol/L (ref 135–145)
Total Bilirubin: 0.3 mg/dL (ref 0.3–1.2)
Total Protein: 6.6 g/dL (ref 6.5–8.1)

## 2021-06-27 LAB — CBC WITH DIFFERENTIAL (CANCER CENTER ONLY)
Abs Immature Granulocytes: 0.03 10*3/uL (ref 0.00–0.07)
Basophils Absolute: 0.1 10*3/uL (ref 0.0–0.1)
Basophils Relative: 1 %
Eosinophils Absolute: 0.3 10*3/uL (ref 0.0–0.5)
Eosinophils Relative: 3 %
HCT: 27.9 % — ABNORMAL LOW (ref 36.0–46.0)
Hemoglobin: 8.8 g/dL — ABNORMAL LOW (ref 12.0–15.0)
Immature Granulocytes: 0 %
Lymphocytes Relative: 8 %
Lymphs Abs: 0.8 10*3/uL (ref 0.7–4.0)
MCH: 24.9 pg — ABNORMAL LOW (ref 26.0–34.0)
MCHC: 31.5 g/dL (ref 30.0–36.0)
MCV: 79 fL — ABNORMAL LOW (ref 80.0–100.0)
Monocytes Absolute: 0.8 10*3/uL (ref 0.1–1.0)
Monocytes Relative: 8 %
Neutro Abs: 7.7 10*3/uL (ref 1.7–7.7)
Neutrophils Relative %: 80 %
Platelet Count: 196 10*3/uL (ref 150–400)
RBC: 3.53 MIL/uL — ABNORMAL LOW (ref 3.87–5.11)
RDW: 14 % (ref 11.5–15.5)
WBC Count: 9.7 10*3/uL (ref 4.0–10.5)
nRBC: 0 % (ref 0.0–0.2)

## 2021-06-27 LAB — POCT URINALYSIS DIPSTICK
Bilirubin, UA: NEGATIVE
Blood, UA: NEGATIVE
Glucose, UA: NEGATIVE
Ketones, UA: NEGATIVE
Leukocytes, UA: NEGATIVE
Nitrite, UA: NEGATIVE
Protein, UA: POSITIVE — AB
Spec Grav, UA: 1.02 (ref 1.010–1.025)
Urobilinogen, UA: 0.2 E.U./dL
pH, UA: 5.5 (ref 5.0–8.0)

## 2021-06-27 MED ORDER — POTASSIUM CHLORIDE CRYS ER 20 MEQ PO TBCR
20.0000 meq | EXTENDED_RELEASE_TABLET | Freq: Every day | ORAL | 0 refills | Status: DC
Start: 2021-06-27 — End: 2021-10-03

## 2021-06-27 MED ORDER — FLUCONAZOLE 150 MG PO TABS: 150.0000 mg | ORAL_TABLET | Freq: Once | ORAL | 0 refills | Status: AC

## 2021-06-27 NOTE — Progress Notes (Signed)
Called the patient and let her know her potassium was slightly low. Sending in 5 day prescription. Confirmed she is not taking potassium supplements.

## 2021-06-27 NOTE — Progress Notes (Signed)
[place] Telephone:([phone]) [phone]   Fax:(336) [phone]  OFFICE PROGRESS NOTE  [name], MD [name] [id number]  DIAGNOSIS: Stage IV (Tx, N3, M1C) non-small cell lung cancer, adenocarcinoma. She presented with a left primary lung lesion and widespread metastatic disease in the thoracic nodes, left pleural space, abdomen pelvic lymph nodes, and bones.  She was diagnosed in October 2022.  Molecular studies: The patient has positive EGFR mutation in exon 21 (L858R)   PRIOR THERAPY: None    CURRENT THERAPY:  1) Targeted treatment with Tagrisso 80 mg p.o. daily.  First dose started on June 21, 2021.  2) palliative radiation  INTERVAL HISTORY: [name] 80 y.o. female returns to the clinic today for follow-up visit accompanied by her daughter.  The patient is feeling fine today with no concerning complaints except for mild rash under her breast.  This has been going on for a while before starting treatment with Tagrisso.  She denied having any other rash or diarrhea.  She denied having any chest pain but has baseline shortness of breath increased with exertion with no cough or hemoptysis.  She denied having any weight loss or night sweats.  She started her treatment with Tagrisso a week ago and she is tolerating it fairly well.  The patient denied having any nausea, vomiting, abdominal pain or constipation.  She denied having any headache or visual changes.  MEDICAL HISTORY: Past Medical History:  Diagnosis Date   Allergy    Angiodysplasia of cecum    Angiodysplasia of duodenum    Arthritis    Asteatotic eczema 02/25/2016   Asthma    Atrial fibrillation ([place]) 10/17/2017   Blood transfusion without reported diagnosis    Cancer ([place])    Cataract    CHF (congestive heart failure), NYHA class III, chronic, combined ([place]) 08/10/2017   Colonic polyp 02/16/2008   Tubular adenoma   COPD (chronic obstructive pulmonary disease) ([place])  01/27/1998   Cor pulmonale ([place]) 11/16/2014   GERD 07/30/1992   Gout    HTN (hypertension) 07/30/1988   Hyperlipidemia with target LDL less than 100 11/27/1996        Kidney stone 1960, 1972, 1991   MGUS (monoclonal gammopathy of unknown significance) 11/08/2016   Morbid obesity ([place]) 04/19/2010   She agrees to work on her lifestyle modifications to help her lose weight.    OSA (obstructive sleep apnea) 06/22/2013   Osteopenia, senile 02/05/2013   July 2014  -2.1 left femur -1.9 left forearm    Prediabetes 11/13/2006   Renal insufficiency    Stenosis of cervical spine with myelopathy ([place]) 01/01/2018   Ulcer of leg, chronic, right ([place]) 10/10/2011   ULCERATIVE COLITIS-LEFT SIDE 03/19/2008        Vitamin B12 deficiency anemia 11/13/2006        Vitamin D deficiency 02/06/2013    ALLERGIES:  is allergic to celebrex [celecoxib], amlodipine besylate, enalapril, lipitor [atorvastatin], amoxicillin, codeine sulfate, and hydrocodone-acetaminophen.  MEDICATIONS:  Current Outpatient Medications  Medication Sig Dispense Refill   acetaminophen (TYLENOL) 325 MG tablet Take 325-650 mg by mouth daily as needed for moderate pain, fever or headache.      albuterol (PROAIR HFA) 108 (90 Base) MCG/ACT inhaler Inhale 1-2 puffs into the lungs every 6 (six) hours as needed for wheezing or shortness of breath. 18 g 11   allopurinol (ZYLOPRIM) 100 MG tablet TAKE 1 TABLET(100 MG) BY MOUTH DAILY 90 tablet 1  clonazePAM (KLONOPIN) 1 MG tablet Take 1 tablet (1 mg total) by mouth 2 (two) times daily as needed for anxiety. 180 tablet 0   ferrous sulfate 325 (65 FE) MG tablet Take 1 tablet (325 mg total) by mouth 2 (two) times daily with a meal. 180 tablet 1   Fluticasone-Umeclidin-Vilant (TRELEGY ELLIPTA) 100-62.5-25 MCG/INH AEPB Inhale 1 puff into the lungs daily. 120 each 1   guaiFENesin-dextromethorphan (ROBITUSSIN DM) 100-10 MG/5ML syrup Take 5 mLs by mouth every 4 (four) hours as needed for cough (chest  congestion). (Patient not taking: No sig reported) 118 mL 0   isosorbide-hydrALAZINE (BIDIL) 20-37.5 MG tablet TAKE 1 TABLET BY MOUTH THREE TIMES DAILY 270 tablet 0   metoprolol tartrate (LOPRESSOR) 25 MG tablet TAKE 1 TABLET(25 MG) BY MOUTH TWICE DAILY (Patient taking differently: Take 12.5 mg by mouth 2 (two) times daily.) 180 tablet 1   morphine (MS CONTIN) 15 MG 12 hr tablet Take 1 tablet (15 mg total) by mouth 2 (two) times daily as needed for pain. 60 tablet 0   osimertinib mesylate (TAGRISSO) 80 MG tablet Take 1 tablet (80 mg total) by mouth daily. (Patient not taking: Reported on 06/13/2021) 30 tablet 3   prochlorperazine (COMPAZINE) 10 MG tablet Take 1 tablet (10 mg total) by mouth every 6 (six) hours as needed. (Patient not taking: Reported on 06/13/2021) 30 tablet 2   torsemide (DEMADEX) 20 MG tablet TAKE 2 TABLETS(40 MG) BY MOUTH EVERY MORNING 180 tablet 0   Current Facility-Administered Medications  Medication Dose Route Frequency Provider Last Rate Last Admin   cyanocobalamin ((VITAMIN B-12)) injection 1,000 mcg  1,000 mcg Intramuscular Q30 days [name], MD   1,000 mcg at 01/31/21 1507    SURGICAL HISTORY:  Past Surgical History:  Procedure Laterality Date   BRONCHOSCOPY     CHEST TUBE INSERTION Left 06/15/2021   Procedure: INSERTION PLEURAL DRAINAGE CATHETER;  Surgeon: [name], MD;  Location: [facility];  Service: Thoracic;  Laterality: Left;   COLONOSCOPY     COLONOSCOPY WITH PROPOFOL N/A 02/23/2015   Procedure: COLONOSCOPY WITH PROPOFOL;  Surgeon: Ladene Artist, MD;  Location: [facility];  Service: Endoscopy;  Laterality: N/A;   COLONOSCOPY WITH PROPOFOL N/A 08/12/2018   Procedure: COLONOSCOPY WITH PROPOFOL;  Surgeon: Ladene Artist, MD;  Location: [facility];  Service: Endoscopy;  Laterality: N/A;   COLONOSCOPY WITH PROPOFOL N/A 05/09/2020   Procedure: COLONOSCOPY WITH PROPOFOL;  Surgeon: Ladene Artist, MD;  Location: [facility];  Service:  Endoscopy;  Laterality: N/A;  To Splenic Flexture   ESOPHAGOGASTRODUODENOSCOPY (EGD) WITH PROPOFOL N/A 02/23/2015   Procedure: ESOPHAGOGASTRODUODENOSCOPY (EGD) WITH PROPOFOL;  Surgeon: Ladene Artist, MD;  [facility];  Service: Endoscopy;  Laterality: N/A;   ESOPHAGOGASTRODUODENOSCOPY (EGD) WITH PROPOFOL N/A 08/12/2018   Procedure: ESOPHAGOGASTRODUODENOSCOPY (EGD) WITH PROPOFOL;  Surgeon: Ladene Artist, MD;  [facility];  Service: Endoscopy;  Laterality: N/A;   ESOPHAGOGASTRODUODENOSCOPY (EGD) WITH PROPOFOL N/A 05/08/2020   Procedure: ESOPHAGOGASTRODUODENOSCOPY (EGD) WITH PROPOFOL;  Surgeon: Yetta Flock, MD;  [facility]: [facility];  Service: Gastroenterology;  Laterality: N/A;   HOT HEMOSTASIS N/A 08/12/2018   Procedure: HOT HEMOSTASIS (ARGON PLASMA COAGULATION/BICAP);  Surgeon: Ladene Artist, MD;  Location: [facility];  Service: Endoscopy;  Laterality: N/A;  EGD and Colon APC   HOT HEMOSTASIS N/A 05/08/2020   Procedure: HOT HEMOSTASIS (ARGON PLASMA COAGULATION/BICAP);  Surgeon: Yetta Flock, MD;  Location: [facility];  Service: Gastroenterology;  Laterality: N/A;   LAPAROTOMY  N/A 08/11/2020   Procedure: EXPLORATORY LAPAROTOMY PARTIAL COLECTOMY AND COLOSTOMY WITH RESECTION OF A MESSENTERIC MASS;  Surgeon: Coralie Keens, MD;  Location: [facility];  Service: General;  Laterality: N/A;   POLYPECTOMY      REVIEW OF SYSTEMS:  A comprehensive review of systems was negative except for: Constitutional: positive for fatigue Respiratory: positive for dyspnea on exertion Musculoskeletal: positive for arthralgias and muscle weakness   PHYSICAL EXAMINATION: General appearance: alert, cooperative, fatigued, and no distress Head: Normocephalic, without obvious abnormality, atraumatic Neck: no adenopathy, no JVD, supple, symmetrical, trachea midline, and thyroid not enlarged, symmetric, no tenderness/mass/nodules Lymph nodes: Cervical, supraclavicular, and  axillary nodes normal. Resp: clear to auscultation bilaterally Back: symmetric, no curvature. ROM normal. No CVA tenderness. Cardio: regular rate and rhythm, S1, S2 normal, no murmur, click, rub or gallop GI: soft, non-tender; bowel sounds normal; no masses,  no organomegaly Extremities: extremities normal, atraumatic, no cyanosis or edema  ECOG PERFORMANCE STATUS: 1 - Symptomatic but completely ambulatory  Blood pressure (!) 99/54, pulse 64, temperature 97.9 F (36.6 C), temperature source Axillary, resp. rate 17, height 5' (1.524 m), SpO2 92 %.  LABORATORY DATA: Lab Results  Component Value Date   WBC 9.7 06/27/2021   HGB 8.8 (L) 06/27/2021   HCT 27.9 (L) 06/27/2021   MCV 79.0 (L) 06/27/2021   PLT 196 06/27/2021      Chemistry      Component Value Date/Time   NA 140 06/08/2021 0902   NA 142 04/11/2016 1007   K 4.2 06/08/2021 0902   K 3.3 (L) 04/11/2016 1007   CL 98 06/08/2021 0902   CO2 32 06/08/2021 0902   CO2 31 (H) 04/11/2016 1007   BUN 40 (H) 06/08/2021 0902   BUN 24.4 04/11/2016 1007   CREATININE 1.37 (H) 06/08/2021 0902   CREATININE 1.36 (H) 02/04/2020 1443   CREATININE 1.1 04/11/2016 1007      Component Value Date/Time   CALCIUM 10.2 06/08/2021 0902   CALCIUM 9.8 04/11/2016 1007   ALKPHOS 60 06/08/2021 0902   ALKPHOS 82 04/11/2016 1007   AST 13 (L) 06/08/2021 0902   AST 12 04/11/2016 1007   ALT 6 06/08/2021 0902   ALT 12 04/11/2016 1007   BILITOT 0.3 06/08/2021 0902   BILITOT 0.35 04/11/2016 1007       RADIOGRAPHIC STUDIES: DG Chest 1 View  Result Date: 06/08/2021 CLINICAL DATA:  Status post thoracentesis. EXAM: CHEST  1 VIEW COMPARISON:  06/07/2021 FINDINGS: Cardiac enlargement and aortic atherosclerosis. Moderate to large left pleural effusion is decreased in volume from previous exam status post thoracentesis. No pneumothorax visualized. Right lung is clear. IMPRESSION: Decrease in volume of left pleural effusion status post thoracentesis. No  pneumothorax. Electronically Signed   By: Kerby Moors M.D.   On: 06/08/2021 14:16   DG Chest 2 View  Result Date: 06/14/2021 CLINICAL DATA:  Recurrent left pleural effusion. EXAM: CHEST - 2 VIEW COMPARISON:  06/08/2021 FINDINGS: Large left pleural effusion shows no significant change compared to prior study. No evidence of pneumothorax. No right-sided pleural effusion. Right lung is clear. Stable heart size. Aortic atherosclerotic calcification noted. IMPRESSION: Large left pleural effusion, without significant change since prior exam. Electronically Signed   By: Marlaine Hind M.D.   On: 06/14/2021 11:01   DG Chest 2 View  Result Date: 06/07/2021 CLINICAL DATA:  metastatic adenocarcinoma EXAM: CHEST - 2 VIEW COMPARISON:  Chest radiograph May 05, 2021 FINDINGS: Large left pleural effusion. Overlying opacities in the left lung. Cardiac  silhouette is largely obscured and mildly displaced the right. Prominent right hilar and left suprahilar silhouette. IMPRESSION: 1. Large left pleural effusion, increased 2. Overlying left lung opacities and right basilar opacities, which could represent atelectasis, pneumonia and/or aspiration. 3. Prominent right hilar and left suprahilar silhouette. Please correlate with same day PET-CT for evaluation of malignancy. Electronically Signed   By: Margaretha Sheffield M.D.   On: 06/07/2021 15:29   MR BRAIN W WO CONTRAST  Result Date: 06/05/2021 CLINICAL DATA:  80 year old female non-small cell lung cancer. Staging. EXAM: MRI HEAD WITHOUT AND WITH CONTRAST TECHNIQUE: Multiplanar, multiecho pulse sequences of the brain and surrounding structures were obtained without and with intravenous contrast. CONTRAST:  57m GADAVIST GADOBUTROL 1 MMOL/ML IV SOLN COMPARISON:  Brain MRI without contrast 09/24/2018. FINDINGS: Brain: Stable cerebral volume since 2020. In the posterior right parietal lobe there is a punctate area of cortical restricted diffusion (series 9, image 81) with no  associated enhancement, and faint if any T2/FLAIR hyperintensity. No other abnormal diffusion. No abnormal enhancement identified. Black blood technique postcontrast imaging. No midline shift, mass effect, or evidence of intracranial mass lesion. No dural thickening. Chronic increased perivascular spaces in the brain. Chronic but increased widely scattered patchy subcortical and periventricular white matter T2 and FLAIR hyperintensity. Similar chronic T2 heterogeneity in the pons. But no cortical encephalomalacia or chronic cerebral blood products identified. No ventriculomegaly, extra-axial collection or acute intracranial hemorrhage. Cervicomedullary junction and pituitary are within normal limits. Vascular: Major intracranial vascular flow voids are stable. Skull and upper cervical spine: Negative visible cervical spine and spinal cord. Bone marrow signal appears stable since 2020, within normal limits. Sinuses/Orbits: Stable, negative. Other: Mastoids remain clear. Visible internal auditory structures appear normal. Negative visible scalp and face. IMPRESSION: 1. Solitary punctate abnormal diffusion in the right parietal lobe without enhancement. This is most likely a recent lacunar infarct (see also #3) which might be clinically silent. Repeat brain MRI without and with contrast in 3 months could be obtained to document resolution. 2. No metastatic disease identified. 3. Chronic but increased since 2020 signal changes in the cerebral white matter and pons most commonly due to chronic small vessel disease. Electronically Signed   By: HGenevie AnnM.D.   On: 06/05/2021 10:48   NM PET Image Initial (PI) Skull Base To Thigh (F-18 FDG)  Result Date: 06/07/2021 CLINICAL DATA:  Initial treatment strategy for non-small-cell lung cancer. EXAM: NUCLEAR MEDICINE PET SKULL BASE TO THIGH TECHNIQUE: 8.8 mCi F-18 FDG was injected intravenously. Full-ring PET imaging was performed from the skull base to thigh after the  radiotracer. CT data was obtained and used for attenuation correction and anatomic localization. Fasting blood glucose: 121 mg/dl COMPARISON:  08/09/2020 abdominopelvic CT.  Chest CT of 05/20/2018 FINDINGS: Mediastinal blood pool activity: SUV max 2.6 Liver activity: SUV max NA NECK: No areas of abnormal hypermetabolism. Incidental CT findings: No cervical adenopathy. CHEST: Moderate to large left-sided pleural effusion with extensive pleural hypermetabolism consistent with pleural metastasis. Example inferiorly and posteriorly at a S.U.V. max of 11.2 (75/4). Within collapsed left lung, there are areas of hypermetabolism which likely represent lung primary or primaries. Example likely within the central left upper lobe at a S.U.V. max of 18.9 on 58/4. Within the posterior left lower lobe at a S.U.V. max of 9.8 on 70/4. Left hilar nodal metastasis with direct extension into the left side of the mediastinum. Example at a S.U.V. max of 24.5 including on 59/4. Contralateral hilar nodal metastasis, including at a  S.U.V. max of 15.9 on 52/4. A right paravertebral versus pleural hypermetabolic nodule/implant measures 6 mm and a S.U.V. max of 4.2 on 77/4. Incidental CT findings: Aortic and coronary artery calcification. Pulmonary artery enlargement, outflow tract 3.4 cm. ABDOMEN/PELVIS: Anterior splenic focus of hypermetabolism is without CT correlate at a S.U.V. max of 5.7 on approximately image 103/4, suspicious. Distal colonic hypermetabolism is favored to be physiologic. A small left external iliac node corresponds to hypermetabolism at a S.U.V. max of 6.4 on 141/4. Nodule anterior to the right iliacus muscle measures 6 mm and a S.U.V. max of 4.4 on 113/4. Anterior pelvic wall soft tissue implant, about the posterior aspect of an area of pelvic wall laxity measures 3.1 x 1.8 cm and a S.U.V. max of 15.8 on 145/4. Incidental CT findings: Bilateral renal lesions which are likely cysts and complex cysts, but suboptimally  evaluated. Normal adrenal glands. Descending loop colostomy. Large and small bowel within an area of pelvic anterior wall laxity. Abdominal aortic atherosclerosis. SKELETON: Osseous and soft tissue metastasis within the right iliac wing measures 2.4 cm and a S.U.V. max of 24.5 on 121/4. At least 1 possible left rib metastasis is difficult to differentiate from circumferential pleural hypermetabolism. Incidental CT findings: none IMPRESSION: 1. Widespread metastatic disease, including to thoracic nodes, left pleural space, abdomen/pelvis, bones. The primary or primaries are likely within the compressed left lung, as detailed above. 2. Incidental findings, including: Moderate left pleural effusion. Coronary artery atherosclerosis. Aortic Atherosclerosis (ICD10-I70.0). Pulmonary artery enlargement suggests pulmonary arterial hypertension. Electronically Signed   By: Abigail Miyamoto M.D.   On: 06/07/2021 15:44   DG C-Arm 1-60 Min-No Report  Result Date: 06/15/2021 Fluoroscopy was utilized by the requesting physician.  No radiographic interpretation.   US Thoracentesis Asp Pleural space w/IMG guide  Result Date: 06/08/2021 INDICATION: History of CHF and shortness of breath with persistent left pleural effusion. Request for IR to perform therapeutic thoracentesis EXAM: ULTRASOUND GUIDED THERAPEUTIC THORACENTESIS MEDICATIONS: 24m 1% lidocaine COMPLICATIONS: None immediate. PROCEDURE: An ultrasound guided thoracentesis was thoroughly discussed with the patient and questions answered. The benefits, risks, alternatives and complications were also discussed. The patient understands and wishes to proceed with the procedure. Written consent was obtained. Ultrasound was performed to localize and mark an adequate pocket of fluid in the left chest. The area was then prepped and draped in the normal sterile fashion. 1% Lidocaine was used for local anesthesia. Under ultrasound guidance a 6 Fr Safe-T-Centesis catheter was  introduced. Thoracentesis was performed. The catheter was removed and a dressing applied. FINDINGS: A total of approximately 700 cc of clear, yellow fluid was removed. Scratch IMPRESSION: Successful ultrasound guided left thoracentesis yielding 700 cc of pleural fluid. Read by: SNarda Rutherford NP Electronically Signed   By: DRuthann CancerM.D.   On: 06/08/2021 15:08    ASSESSMENT AND PLAN: This is a very pleasant 80years old with a stage IV (TX, N3, M1 C) non-small cell lung cancer, adenocarcinoma with positive EGFR mutation exon 21 (L858R) diagnosed in October 2022 1 presented with left primary lung lesion in addition to widespread metastatic disease in the thoracic nodes, left pleural space as well as abdominal pelvic lymph node and bone metastasis. The patient is currently undergoing treatment with Tagrisso 80 mg p.o. daily started a week ago. She has been tolerating this treatment well with no concerning adverse effects. I recommended for the patient to continue her current treatment with Tagrisso with the same dose. I will see her back for follow-up  visit in 3 weeks for evaluation with repeat blood work. The patient was advised to call immediately if she has any other concerning symptoms in the interval. The patient voices understanding of current disease status and treatment options and is in agreement with the current care plan.  All questions were answered. The patient knows to call the clinic with any problems, questions or concerns. We can certainly see the patient much sooner if necessary.  The total time spent in the appointment was 20 minutes.  Disclaimer: This note was dictated with voice recognition software. Similar sounding words can inadvertently be transcribed and may not be corrected upon review.

## 2021-06-27 NOTE — Progress Notes (Signed)
Nutrition Follow-up:  Patient with stage IV non-small cell lung cancer. She is receiving Tagrisso.   Met with patient and granddaughter in clinic. Patient reports starting Tagrisso last week and is tolerating well. She denies nausea, vomiting, diarrhea, constipation. Patient reports ongoing decreased appetite and is not eating much. Yesterday she had peanut butter crackers and 2 premier protein shakes. On Sunday she ate half of a chicken salad sandwich, half pimento cheese sandwich, drank one Atkins shake.    Medications: reviewed   Labs: K 3.2, Glucose 114, BUN 27, Cr 1.56  Anthropometrics: No new weight today, pt noted too wobbly to stand today. Last weight 174 lb on 11/17   NUTRITION DIAGNOSIS: Suspect unintentional weight loss ongoing, noted pt reports too wobbly to stand for weight today at MD visit   INTERVENTION:  Encouraged high calorie high protein foods - handout with snack ideas and shake recipes provided  Suggested pt try soft moist high protein foods for ease of intake given COPD with O2 dependency Continue drinking oral nutrition supplements, recommend 2-3 daily as well as switching to Ensure Plus/equivalent for added calories - samples of Ensure Complete, coupons, and CIB powder provided today Contact information provided     MONITORING, EVALUATION, GOAL: weight trends, intake   NEXT VISIT: Tuesday December 20 after MD visit

## 2021-06-27 NOTE — Addendum Note (Signed)
Addended by: Ardeen Garland on: 06/27/2021 01:26 PM   Modules accepted: Orders

## 2021-06-27 NOTE — Patient Instructions (Addendum)
The urine does not look infected and there is no blood in it. We will try treating for a yeast infection with diflucan. Take 1 pill of this to clear a yeast infection and if this will help it should help in 1-2 days.

## 2021-06-27 NOTE — Progress Notes (Signed)
   Subjective:   Patient ID: [name], female    DOB: [birth year], 80 y.o.   MRN: [id number]  HPI The patient is an 80 YO female coming in for burning and itching inguinal with urination for months. She has not been checked for UTI to her knowledge. Is undergoing chemo starting last week for metastatic lung cancer. Also has renal cell carcinoma possible found Jan 2022. She denies blood in urine. No vaginal discharge. Has had multiple rounds of antibiotics in the last few months while they were diagnosing the lung cancer.   Review of Systems  Constitutional:  Positive for activity change and appetite change.  HENT: Negative.    Eyes: Negative.   Respiratory:  Positive for cough and shortness of breath. Negative for chest tightness.   Cardiovascular:  Negative for chest pain, palpitations and leg swelling.  Gastrointestinal:  Negative for abdominal distention, abdominal pain, constipation, diarrhea, nausea and vomiting.  Genitourinary:  Positive for dysuria.       Itching  Musculoskeletal:  Positive for myalgias.  Skin: Negative.   Neurological: Negative.   Psychiatric/Behavioral: Negative.     Objective:  Physical Exam Constitutional:      Appearance: She is well-developed. She is obese. She is ill-appearing.  HENT:     Head: Normocephalic and atraumatic.  Cardiovascular:     Rate and Rhythm: Normal rate and regular rhythm.  Pulmonary:     Effort: Pulmonary effort is normal. No respiratory distress.     Comments: Oxygen in place Abdominal:     General: Bowel sounds are normal. There is no distension.     Palpations: Abdomen is soft.     Tenderness: There is no abdominal tenderness. There is no rebound.  Musculoskeletal:     Cervical back: Normal range of motion.  Skin:    General: Skin is warm and dry.  Neurological:     Mental Status: She is alert and oriented to person, place, and time.     Coordination: Coordination abnormal.    Vitals:   06/27/21 1457  BP:  128/84  Pulse: 67  Resp: 18  SpO2: 91%  Weight: 172 lb (78 kg)  Height: 5' (1.524 m)    This visit occurred during the SARS-CoV-2 public health emergency.  Safety protocols were in place, including screening questions prior to the visit, additional usage of staff PPE, and extensive cleaning of exam room while observing appropriate contact time as indicated for disinfecting solutions.   Assessment & Plan:  Visit time 25 minutes in face to face communication with patient and coordination of care, additional 15 minutes spent in record review, coordination or care, ordering tests, communicating/referring to other healthcare professionals, documenting in medical records all on the same day of the visit for total time 40 minutes spent on the visit.

## 2021-06-28 DIAGNOSIS — D631 Anemia in chronic kidney disease: Secondary | ICD-10-CM | POA: Diagnosis not present

## 2021-06-28 DIAGNOSIS — D519 Vitamin B12 deficiency anemia, unspecified: Secondary | ICD-10-CM | POA: Diagnosis not present

## 2021-06-28 DIAGNOSIS — I700 Atherosclerosis of aorta: Secondary | ICD-10-CM | POA: Diagnosis not present

## 2021-06-28 DIAGNOSIS — J918 Pleural effusion in other conditions classified elsewhere: Secondary | ICD-10-CM | POA: Diagnosis not present

## 2021-06-28 DIAGNOSIS — M479 Spondylosis, unspecified: Secondary | ICD-10-CM | POA: Diagnosis not present

## 2021-06-28 DIAGNOSIS — K219 Gastro-esophageal reflux disease without esophagitis: Secondary | ICD-10-CM | POA: Diagnosis not present

## 2021-06-28 DIAGNOSIS — M19072 Primary osteoarthritis, left ankle and foot: Secondary | ICD-10-CM | POA: Diagnosis not present

## 2021-06-28 DIAGNOSIS — D630 Anemia in neoplastic disease: Secondary | ICD-10-CM | POA: Diagnosis not present

## 2021-06-28 DIAGNOSIS — U099 Post covid-19 condition, unspecified: Secondary | ICD-10-CM | POA: Diagnosis not present

## 2021-06-28 DIAGNOSIS — I5042 Chronic combined systolic (congestive) and diastolic (congestive) heart failure: Secondary | ICD-10-CM | POA: Diagnosis not present

## 2021-06-28 DIAGNOSIS — J449 Chronic obstructive pulmonary disease, unspecified: Secondary | ICD-10-CM | POA: Diagnosis not present

## 2021-06-28 DIAGNOSIS — I878 Other specified disorders of veins: Secondary | ICD-10-CM | POA: Diagnosis not present

## 2021-06-28 DIAGNOSIS — H539 Unspecified visual disturbance: Secondary | ICD-10-CM | POA: Diagnosis not present

## 2021-06-28 DIAGNOSIS — E785 Hyperlipidemia, unspecified: Secondary | ICD-10-CM | POA: Diagnosis not present

## 2021-06-28 DIAGNOSIS — M85832 Other specified disorders of bone density and structure, left forearm: Secondary | ICD-10-CM | POA: Diagnosis not present

## 2021-06-28 DIAGNOSIS — N1832 Chronic kidney disease, stage 3b: Secondary | ICD-10-CM | POA: Diagnosis not present

## 2021-06-28 DIAGNOSIS — I2781 Cor pulmonale (chronic): Secondary | ICD-10-CM | POA: Diagnosis not present

## 2021-06-28 DIAGNOSIS — G4733 Obstructive sleep apnea (adult) (pediatric): Secondary | ICD-10-CM | POA: Diagnosis not present

## 2021-06-28 DIAGNOSIS — N2889 Other specified disorders of kidney and ureter: Secondary | ICD-10-CM | POA: Insufficient documentation

## 2021-06-28 DIAGNOSIS — D472 Monoclonal gammopathy: Secondary | ICD-10-CM | POA: Diagnosis not present

## 2021-06-28 DIAGNOSIS — M1030 Gout due to renal impairment, unspecified site: Secondary | ICD-10-CM | POA: Diagnosis not present

## 2021-06-28 DIAGNOSIS — M810 Age-related osteoporosis without current pathological fracture: Secondary | ICD-10-CM | POA: Diagnosis not present

## 2021-06-28 DIAGNOSIS — I130 Hypertensive heart and chronic kidney disease with heart failure and stage 1 through stage 4 chronic kidney disease, or unspecified chronic kidney disease: Secondary | ICD-10-CM | POA: Diagnosis not present

## 2021-06-28 DIAGNOSIS — M19071 Primary osteoarthritis, right ankle and foot: Secondary | ICD-10-CM | POA: Diagnosis not present

## 2021-06-28 DIAGNOSIS — J9621 Acute and chronic respiratory failure with hypoxia: Secondary | ICD-10-CM | POA: Diagnosis not present

## 2021-06-28 DIAGNOSIS — M4802 Spinal stenosis, cervical region: Secondary | ICD-10-CM | POA: Diagnosis not present

## 2021-06-28 NOTE — Assessment & Plan Note (Signed)
It is not likely related to her itching and pain inguinally. She does have mets in the stomach known on imaging. Reviewed recent PET scan in detail. Chemo started last week so is not concurrent with symptoms.

## 2021-06-28 NOTE — Assessment & Plan Note (Signed)
Pain and itching inguinally. Checked POC U/A in office visit and no blood in urine or signs of bacterial infection. It is possible she has yeast infection from multiple rounds of antibiotics and will treat with diflucan.

## 2021-06-28 NOTE — Assessment & Plan Note (Signed)
Reviewed story and history and imaging from Jan 2022 which did show renal mass consistent with renal cell carcinoma and she states she saw urologist (?) who told her the surgery would not leave her enough renal function and she would end up on dialysis so she is not sure if this was treated. Reviewed recent PET scan but this is not very diagnostic for renal lesions given the metabolic activity of the kidneys.

## 2021-06-29 DIAGNOSIS — U099 Post covid-19 condition, unspecified: Secondary | ICD-10-CM | POA: Diagnosis not present

## 2021-06-29 DIAGNOSIS — H539 Unspecified visual disturbance: Secondary | ICD-10-CM | POA: Diagnosis not present

## 2021-06-29 DIAGNOSIS — J449 Chronic obstructive pulmonary disease, unspecified: Secondary | ICD-10-CM | POA: Diagnosis not present

## 2021-06-29 DIAGNOSIS — D519 Vitamin B12 deficiency anemia, unspecified: Secondary | ICD-10-CM | POA: Diagnosis not present

## 2021-06-29 DIAGNOSIS — M85832 Other specified disorders of bone density and structure, left forearm: Secondary | ICD-10-CM | POA: Diagnosis not present

## 2021-06-29 DIAGNOSIS — J9621 Acute and chronic respiratory failure with hypoxia: Secondary | ICD-10-CM | POA: Diagnosis not present

## 2021-06-29 DIAGNOSIS — M479 Spondylosis, unspecified: Secondary | ICD-10-CM | POA: Diagnosis not present

## 2021-06-29 DIAGNOSIS — I5042 Chronic combined systolic (congestive) and diastolic (congestive) heart failure: Secondary | ICD-10-CM | POA: Diagnosis not present

## 2021-06-29 DIAGNOSIS — M4802 Spinal stenosis, cervical region: Secondary | ICD-10-CM | POA: Diagnosis not present

## 2021-06-29 DIAGNOSIS — M19071 Primary osteoarthritis, right ankle and foot: Secondary | ICD-10-CM | POA: Diagnosis not present

## 2021-06-29 DIAGNOSIS — J918 Pleural effusion in other conditions classified elsewhere: Secondary | ICD-10-CM | POA: Diagnosis not present

## 2021-06-29 DIAGNOSIS — D631 Anemia in chronic kidney disease: Secondary | ICD-10-CM | POA: Diagnosis not present

## 2021-06-29 DIAGNOSIS — D472 Monoclonal gammopathy: Secondary | ICD-10-CM | POA: Diagnosis not present

## 2021-06-29 DIAGNOSIS — I700 Atherosclerosis of aorta: Secondary | ICD-10-CM | POA: Diagnosis not present

## 2021-06-29 DIAGNOSIS — M1030 Gout due to renal impairment, unspecified site: Secondary | ICD-10-CM | POA: Diagnosis not present

## 2021-06-29 DIAGNOSIS — N1832 Chronic kidney disease, stage 3b: Secondary | ICD-10-CM | POA: Diagnosis not present

## 2021-06-29 DIAGNOSIS — E785 Hyperlipidemia, unspecified: Secondary | ICD-10-CM | POA: Diagnosis not present

## 2021-06-29 DIAGNOSIS — I2781 Cor pulmonale (chronic): Secondary | ICD-10-CM | POA: Diagnosis not present

## 2021-06-29 DIAGNOSIS — G4733 Obstructive sleep apnea (adult) (pediatric): Secondary | ICD-10-CM | POA: Diagnosis not present

## 2021-06-29 DIAGNOSIS — M19072 Primary osteoarthritis, left ankle and foot: Secondary | ICD-10-CM | POA: Diagnosis not present

## 2021-06-29 DIAGNOSIS — D630 Anemia in neoplastic disease: Secondary | ICD-10-CM | POA: Diagnosis not present

## 2021-06-29 DIAGNOSIS — K219 Gastro-esophageal reflux disease without esophagitis: Secondary | ICD-10-CM | POA: Diagnosis not present

## 2021-06-29 DIAGNOSIS — M810 Age-related osteoporosis without current pathological fracture: Secondary | ICD-10-CM | POA: Diagnosis not present

## 2021-06-29 DIAGNOSIS — I130 Hypertensive heart and chronic kidney disease with heart failure and stage 1 through stage 4 chronic kidney disease, or unspecified chronic kidney disease: Secondary | ICD-10-CM | POA: Diagnosis not present

## 2021-06-29 DIAGNOSIS — I878 Other specified disorders of veins: Secondary | ICD-10-CM | POA: Diagnosis not present

## 2021-06-30 DIAGNOSIS — I5042 Chronic combined systolic (congestive) and diastolic (congestive) heart failure: Secondary | ICD-10-CM | POA: Diagnosis not present

## 2021-06-30 DIAGNOSIS — M810 Age-related osteoporosis without current pathological fracture: Secondary | ICD-10-CM | POA: Diagnosis not present

## 2021-06-30 DIAGNOSIS — G4733 Obstructive sleep apnea (adult) (pediatric): Secondary | ICD-10-CM | POA: Diagnosis not present

## 2021-06-30 DIAGNOSIS — J449 Chronic obstructive pulmonary disease, unspecified: Secondary | ICD-10-CM | POA: Diagnosis not present

## 2021-06-30 DIAGNOSIS — D631 Anemia in chronic kidney disease: Secondary | ICD-10-CM | POA: Diagnosis not present

## 2021-06-30 DIAGNOSIS — J9621 Acute and chronic respiratory failure with hypoxia: Secondary | ICD-10-CM | POA: Diagnosis not present

## 2021-06-30 DIAGNOSIS — M19071 Primary osteoarthritis, right ankle and foot: Secondary | ICD-10-CM | POA: Diagnosis not present

## 2021-06-30 DIAGNOSIS — J918 Pleural effusion in other conditions classified elsewhere: Secondary | ICD-10-CM | POA: Diagnosis not present

## 2021-06-30 DIAGNOSIS — D472 Monoclonal gammopathy: Secondary | ICD-10-CM | POA: Diagnosis not present

## 2021-06-30 DIAGNOSIS — M4802 Spinal stenosis, cervical region: Secondary | ICD-10-CM | POA: Diagnosis not present

## 2021-06-30 DIAGNOSIS — E785 Hyperlipidemia, unspecified: Secondary | ICD-10-CM | POA: Diagnosis not present

## 2021-06-30 DIAGNOSIS — I2781 Cor pulmonale (chronic): Secondary | ICD-10-CM | POA: Diagnosis not present

## 2021-06-30 DIAGNOSIS — N1832 Chronic kidney disease, stage 3b: Secondary | ICD-10-CM | POA: Diagnosis not present

## 2021-06-30 DIAGNOSIS — J910 Malignant pleural effusion: Secondary | ICD-10-CM | POA: Diagnosis not present

## 2021-06-30 DIAGNOSIS — U099 Post covid-19 condition, unspecified: Secondary | ICD-10-CM | POA: Diagnosis not present

## 2021-06-30 DIAGNOSIS — M85832 Other specified disorders of bone density and structure, left forearm: Secondary | ICD-10-CM | POA: Diagnosis not present

## 2021-06-30 DIAGNOSIS — I878 Other specified disorders of veins: Secondary | ICD-10-CM | POA: Diagnosis not present

## 2021-06-30 DIAGNOSIS — M479 Spondylosis, unspecified: Secondary | ICD-10-CM | POA: Diagnosis not present

## 2021-06-30 DIAGNOSIS — I700 Atherosclerosis of aorta: Secondary | ICD-10-CM | POA: Diagnosis not present

## 2021-06-30 DIAGNOSIS — H539 Unspecified visual disturbance: Secondary | ICD-10-CM | POA: Diagnosis not present

## 2021-06-30 DIAGNOSIS — D519 Vitamin B12 deficiency anemia, unspecified: Secondary | ICD-10-CM | POA: Diagnosis not present

## 2021-06-30 DIAGNOSIS — I130 Hypertensive heart and chronic kidney disease with heart failure and stage 1 through stage 4 chronic kidney disease, or unspecified chronic kidney disease: Secondary | ICD-10-CM | POA: Diagnosis not present

## 2021-06-30 DIAGNOSIS — M1030 Gout due to renal impairment, unspecified site: Secondary | ICD-10-CM | POA: Diagnosis not present

## 2021-06-30 DIAGNOSIS — K219 Gastro-esophageal reflux disease without esophagitis: Secondary | ICD-10-CM | POA: Diagnosis not present

## 2021-06-30 DIAGNOSIS — M19072 Primary osteoarthritis, left ankle and foot: Secondary | ICD-10-CM | POA: Diagnosis not present

## 2021-06-30 DIAGNOSIS — D630 Anemia in neoplastic disease: Secondary | ICD-10-CM | POA: Diagnosis not present

## 2021-06-30 DIAGNOSIS — C3490 Malignant neoplasm of unspecified part of unspecified bronchus or lung: Secondary | ICD-10-CM | POA: Diagnosis not present

## 2021-07-01 ENCOUNTER — Other Ambulatory Visit: Payer: Self-pay | Admitting: Internal Medicine

## 2021-07-01 DIAGNOSIS — M1030 Gout due to renal impairment, unspecified site: Secondary | ICD-10-CM

## 2021-07-03 ENCOUNTER — Telehealth: Payer: Self-pay | Admitting: Radiation Oncology

## 2021-07-03 NOTE — Telephone Encounter (Signed)
I called and left a message for the patient to see how she would like to move forward or wait on radiation treatment.

## 2021-07-05 ENCOUNTER — Telehealth: Payer: Self-pay | Admitting: Radiation Oncology

## 2021-07-05 NOTE — Telephone Encounter (Signed)
I called to check again on the patient and see if she would like to move forward with palliative radiotherapy to her left ribs and right iliac wing as Dr. Lisbeth Renshaw had offered while taking Tagrisso. Our planning team had tried to schedule her but she was reluctant to do so.  I was able to reach her today and she feels like while she's still having symptoms she's not quite ready to commit to any radiation. I let her know that that was completely fine, and that the role of radiation would be more for symptom management as a compliment to what she's already doing with Dr. Julien Nordmann. We left our call that she will let us know if she desires radiation, but also plans to talk with Dr. Julien Nordmann further about this the next time she sees him in about 2 weeks.

## 2021-07-17 LAB — GUARDANT 360

## 2021-07-18 ENCOUNTER — Other Ambulatory Visit: Payer: Self-pay

## 2021-07-18 ENCOUNTER — Inpatient Hospital Stay: Payer: Medicare Other | Admitting: *Deleted

## 2021-07-18 ENCOUNTER — Inpatient Hospital Stay: Payer: Medicare Other | Attending: Internal Medicine | Admitting: Internal Medicine

## 2021-07-18 ENCOUNTER — Inpatient Hospital Stay: Payer: Medicare Other | Admitting: Dietician

## 2021-07-18 ENCOUNTER — Inpatient Hospital Stay: Payer: Medicare Other

## 2021-07-18 VITALS — BP 159/45 | HR 72 | Temp 97.7°F | Resp 19 | Ht 60.0 in

## 2021-07-18 DIAGNOSIS — D649 Anemia, unspecified: Secondary | ICD-10-CM | POA: Diagnosis not present

## 2021-07-18 DIAGNOSIS — C7951 Secondary malignant neoplasm of bone: Secondary | ICD-10-CM | POA: Diagnosis not present

## 2021-07-18 DIAGNOSIS — C3492 Malignant neoplasm of unspecified part of left bronchus or lung: Secondary | ICD-10-CM | POA: Diagnosis present

## 2021-07-18 DIAGNOSIS — C778 Secondary and unspecified malignant neoplasm of lymph nodes of multiple regions: Secondary | ICD-10-CM | POA: Insufficient documentation

## 2021-07-18 DIAGNOSIS — I110 Hypertensive heart disease with heart failure: Secondary | ICD-10-CM | POA: Insufficient documentation

## 2021-07-18 DIAGNOSIS — I509 Heart failure, unspecified: Secondary | ICD-10-CM | POA: Insufficient documentation

## 2021-07-18 DIAGNOSIS — C3490 Malignant neoplasm of unspecified part of unspecified bronchus or lung: Secondary | ICD-10-CM | POA: Diagnosis not present

## 2021-07-18 DIAGNOSIS — R21 Rash and other nonspecific skin eruption: Secondary | ICD-10-CM | POA: Insufficient documentation

## 2021-07-18 LAB — CBC WITH DIFFERENTIAL (CANCER CENTER ONLY)
Abs Immature Granulocytes: 0.03 10*3/uL (ref 0.00–0.07)
Basophils Absolute: 0 10*3/uL (ref 0.0–0.1)
Basophils Relative: 0 %
Eosinophils Absolute: 0.3 10*3/uL (ref 0.0–0.5)
Eosinophils Relative: 4 %
HCT: 27.6 % — ABNORMAL LOW (ref 36.0–46.0)
Hemoglobin: 8.6 g/dL — ABNORMAL LOW (ref 12.0–15.0)
Immature Granulocytes: 0 %
Lymphocytes Relative: 15 %
Lymphs Abs: 1.1 10*3/uL (ref 0.7–4.0)
MCH: 24.9 pg — ABNORMAL LOW (ref 26.0–34.0)
MCHC: 31.2 g/dL (ref 30.0–36.0)
MCV: 80 fL (ref 80.0–100.0)
Monocytes Absolute: 0.8 10*3/uL (ref 0.1–1.0)
Monocytes Relative: 11 %
Neutro Abs: 5 10*3/uL (ref 1.7–7.7)
Neutrophils Relative %: 70 %
Platelet Count: 176 10*3/uL (ref 150–400)
RBC: 3.45 MIL/uL — ABNORMAL LOW (ref 3.87–5.11)
RDW: 14.7 % (ref 11.5–15.5)
WBC Count: 7.1 10*3/uL (ref 4.0–10.5)
nRBC: 0 % (ref 0.0–0.2)

## 2021-07-18 LAB — CMP (CANCER CENTER ONLY)
ALT: <5 U/L (ref 0–44)
AST: 11 U/L — ABNORMAL LOW (ref 15–41)
Albumin: 3.6 g/dL (ref 3.5–5.0)
Alkaline Phosphatase: 46 U/L (ref 38–126)
Anion gap: 7 (ref 5–15)
BUN: 48 mg/dL — ABNORMAL HIGH (ref 8–23)
CO2: 30 mmol/L (ref 22–32)
Calcium: 10.3 mg/dL (ref 8.9–10.3)
Chloride: 99 mmol/L (ref 98–111)
Creatinine: 1.61 mg/dL — ABNORMAL HIGH (ref 0.44–1.00)
GFR, Estimated: 32 mL/min — ABNORMAL LOW (ref >60)
Glucose, Bld: 101 mg/dL — ABNORMAL HIGH (ref 70–99)
Potassium: 4 mmol/L (ref 3.5–5.1)
Sodium: 136 mmol/L (ref 135–145)
Total Bilirubin: 0.3 mg/dL (ref 0.3–1.2)
Total Protein: 7.4 g/dL (ref 6.5–8.1)

## 2021-07-18 MED ORDER — KETOCONAZOLE 2 % EX CREA: 1.0000 "application " | TOPICAL_CREAM | Freq: Every day | CUTANEOUS | 0 refills | Status: DC

## 2021-07-18 NOTE — Progress Notes (Signed)
[place] Work  Clinical Social Work was referred by medical oncology for assessment of psychosocial needs, specifically support dementia support for husband.  Clinical Social Worker met with patient and sister in exam room to offer support and assess for needs.    [name] shared her spouse is 81 years old and has dementia.  She reported he did recently become verbally abusive during an encounter with daughter, but stays relatively calm. She attributed this behavior to his dementia.  CSW discussed importance of receiving support for spouse so patient can focus on her own condition and healing.  CSW provided Research scientist (life sciences) (details below).  Patient agreed to call CSW with any questions or concerns.  Project CARE (Caregiver Alternatives to [facility]) Project CARE is a coordinated delivery system that responds to the needs, values and preferences of individuals who directly care for a family member or friend with Alzheimer's disease or related dementia (ADRD). [place] consultation Dementia-specific information Caregiver education Funds for self-directed respite care subject to eligibility and availability Connections to community-based services and resources Referrals to the [facility] Caregiver Support Program and Area Agencies on [place], [place] [address]    [place], [place]

## 2021-07-18 NOTE — Progress Notes (Signed)
[facility] [phone]   [phone]  OFFICE PROGRESS NOTE  [name], MD [place] [address]  DIAGNOSIS: Stage IV (Tx, N3, M1C) non-small cell lung cancer, adenocarcinoma. She presented with a left primary lung lesion and widespread metastatic disease in the thoracic nodes, left pleural space, abdomen pelvic lymph nodes, and bones.  She was diagnosed in October 2022.  Molecular studies: The patient has positive EGFR mutation in exon 21 (L858R)   PRIOR THERAPY: None    CURRENT THERAPY:  1) Targeted treatment with Tagrisso 80 mg p.o. daily.  First dose started on June 21, 2021.  Status post 1 months.  2) palliative radiation  INTERVAL HISTORY: [name] 80 y.o. female returns to the clinic today for follow-up visit accompanied by her sister.  The patient is celebrating her 80st birthday today.  She denied having any current chest pain but has shortness of breath at baseline increased with exertion.  She does not have much drainage from the Pleurx catheter anymore.  She denied having any chest pain, cough or hemoptysis.  She denied having any fever or chills.  She has no nausea, vomiting, diarrhea or constipation.  She has no headache or visual changes.  She continues to complain of rash under her breast as well as the groin area.  She is here today for evaluation and repeat blood work.   MEDICAL HISTORY: Past Medical History:  Diagnosis Date   Allergy    Angiodysplasia of cecum    Angiodysplasia of duodenum    Arthritis    Asteatotic eczema 02/25/2016   Asthma    Atrial fibrillation ([place]) 10/17/2017   Blood transfusion without reported diagnosis    Cancer ([place])    Cataract    CHF (congestive heart failure), NYHA class III, chronic, combined ([place]) 08/10/2017   Colonic polyp 02/16/2008   Tubular adenoma   COPD (chronic obstructive pulmonary disease) ([place]) 01/27/1998   Cor pulmonale ([place]) 11/16/2014    GERD 07/30/1992   Gout    HTN (hypertension) 07/30/1988   Hyperlipidemia with target LDL less than 100 11/27/1996        Kidney stone 1960, 1972, 1991   MGUS (monoclonal gammopathy of unknown significance) 11/08/2016   Morbid obesity ([place]) 04/19/2010   She agrees to work on her lifestyle modifications to help her lose weight.    OSA (obstructive sleep apnea) 06/22/2013   Osteopenia, senile 02/05/2013   July 2014  -2.1 left femur -1.9 left forearm    Prediabetes 11/13/2006   Renal insufficiency    Stenosis of cervical spine with myelopathy ([place]) 01/01/2018   Ulcer of leg, chronic, right ([place]) 10/10/2011   ULCERATIVE COLITIS-LEFT SIDE 03/19/2008        Vitamin B12 deficiency anemia 11/13/2006        Vitamin D deficiency 02/06/2013    ALLERGIES:  is allergic to celebrex [celecoxib], amlodipine besylate, enalapril, lipitor [atorvastatin], amoxicillin, codeine sulfate, and hydrocodone-acetaminophen.  MEDICATIONS:  Current Outpatient Medications  Medication Sig Dispense Refill   acetaminophen (TYLENOL) 325 MG tablet Take 325-650 mg by mouth daily as needed for moderate pain, fever or headache.      albuterol (PROAIR HFA) 108 (90 Base) MCG/ACT inhaler Inhale 1-2 puffs into the lungs every 6 (six) hours as needed for wheezing or shortness of breath. 18 g 11   allopurinol (ZYLOPRIM) 100 MG tablet TAKE 1 TABLET(100 MG) BY MOUTH DAILY 90 tablet 1   clonazePAM (KLONOPIN)  1 MG tablet Take 1 tablet (1 mg total) by mouth 2 (two) times daily as needed for anxiety. 180 tablet 0   ferrous sulfate 325 (65 FE) MG tablet Take 1 tablet (325 mg total) by mouth 2 (two) times daily with a meal. 180 tablet 1   Fluticasone-Umeclidin-Vilant (TRELEGY ELLIPTA) 100-62.5-25 MCG/INH AEPB Inhale 1 puff into the lungs daily. 120 each 1   isosorbide-hydrALAZINE (BIDIL) 20-37.5 MG tablet TAKE 1 TABLET BY MOUTH THREE TIMES DAILY 270 tablet 0   metoprolol tartrate (LOPRESSOR) 25 MG tablet TAKE 1 TABLET(25 MG) BY MOUTH  TWICE DAILY (Patient taking differently: Take 12.5 mg by mouth 2 (two) times daily.) 180 tablet 1   morphine (MS CONTIN) 15 MG 12 hr tablet Take 1 tablet (15 mg total) by mouth 2 (two) times daily as needed for pain. 60 tablet 0   osimertinib mesylate (TAGRISSO) 80 MG tablet Take 1 tablet (80 mg total) by mouth daily. 30 tablet 3   potassium chloride SA (KLOR-CON M) 20 MEQ tablet Take 1 tablet (20 mEq total) by mouth daily. 5 tablet 0   prochlorperazine (COMPAZINE) 10 MG tablet Take 1 tablet (10 mg total) by mouth every 6 (six) hours as needed. 30 tablet 2   torsemide (DEMADEX) 20 MG tablet TAKE 2 TABLETS(40 MG) BY MOUTH EVERY MORNING 180 tablet 0   Current Facility-Administered Medications  Medication Dose Route Frequency Provider Last Rate Last Admin   cyanocobalamin ((VITAMIN B-12)) injection 1,000 mcg  1,000 mcg Intramuscular Q30 days [name], MD   1,000 mcg at 01/31/21 1507    SURGICAL HISTORY:  Past Surgical History:  Procedure Laterality Date   BRONCHOSCOPY     CHEST TUBE INSERTION Left 06/15/2021   Procedure: INSERTION PLEURAL DRAINAGE CATHETER;  Surgeon: [name], MD;  Location: [facility];  Service: Thoracic;  Laterality: Left;   COLONOSCOPY     COLONOSCOPY WITH PROPOFOL N/A 02/23/2015   Procedure: COLONOSCOPY WITH PROPOFOL;  Surgeon: Ladene Artist, MD;  Location: [facility];  Service: Endoscopy;  Laterality: N/A;   COLONOSCOPY WITH PROPOFOL N/A 08/12/2018   Procedure: COLONOSCOPY WITH PROPOFOL;  Surgeon: Ladene Artist, MD;  Location: [facility];  Service: Endoscopy;  Laterality: N/A;   COLONOSCOPY WITH PROPOFOL N/A 05/09/2020   Procedure: COLONOSCOPY WITH PROPOFOL;  Surgeon: Ladene Artist, MD;  Location: [facility];  Service: Endoscopy;  Laterality: N/A;  To Splenic Flexture   ESOPHAGOGASTRODUODENOSCOPY (EGD) WITH PROPOFOL N/A 02/23/2015   Procedure: ESOPHAGOGASTRODUODENOSCOPY (EGD) WITH PROPOFOL;  Surgeon: Ladene Artist, MD;  Location: [facility];   Service: Endoscopy;  Laterality: N/A;   ESOPHAGOGASTRODUODENOSCOPY (EGD) WITH PROPOFOL N/A 08/12/2018   Procedure: ESOPHAGOGASTRODUODENOSCOPY (EGD) WITH PROPOFOL;  Surgeon: Ladene Artist, MD;  Location: [facility];  Service: Endoscopy;  Laterality: N/A;   ESOPHAGOGASTRODUODENOSCOPY (EGD) WITH PROPOFOL N/A 05/08/2020   Procedure: ESOPHAGOGASTRODUODENOSCOPY (EGD) WITH PROPOFOL;  Surgeon: Yetta Flock, MD;  Location: [facility];  Service: Gastroenterology;  Laterality: N/A;   HOT HEMOSTASIS N/A 08/12/2018   Procedure: HOT HEMOSTASIS (ARGON PLASMA COAGULATION/BICAP);  Surgeon: Ladene Artist, MD;  Location: [facility];  Service: Endoscopy;  Laterality: N/A;  EGD and Colon APC   HOT HEMOSTASIS N/A 05/08/2020   Procedure: HOT HEMOSTASIS (ARGON PLASMA COAGULATION/BICAP);  Surgeon: Yetta Flock, MD;  Location: [facility];  Service: Gastroenterology;  Laterality: N/A;   LAPAROTOMY N/A 08/11/2020   Procedure: EXPLORATORY LAPAROTOMY PARTIAL COLECTOMY AND COLOSTOMY WITH RESECTION OF A MESSENTERIC MASS;  Surgeon: Coralie Keens, MD;  Location:  [facility];  Service: General;  Laterality: N/A;   POLYPECTOMY      REVIEW OF SYSTEMS:  A comprehensive review of systems was negative except for: Constitutional: positive for fatigue Respiratory: positive for dyspnea on exertion Integument/breast: positive for rash Musculoskeletal: positive for arthralgias and muscle weakness   PHYSICAL EXAMINATION: General appearance: alert, cooperative, fatigued, and no distress Head: Normocephalic, without obvious abnormality, atraumatic Neck: no adenopathy, no JVD, supple, symmetrical, trachea midline, and thyroid not enlarged, symmetric, no tenderness/mass/nodules Lymph nodes: Cervical, supraclavicular, and axillary nodes normal. Resp: clear to auscultation bilaterally Back: symmetric, no curvature. ROM normal. No CVA tenderness. Cardio: regular rate and rhythm, S1, S2 normal, no murmur, click, rub or  gallop GI: soft, non-tender; bowel sounds normal; no masses,  no organomegaly Extremities: extremities normal, atraumatic, no cyanosis or edema  ECOG PERFORMANCE STATUS: 1 - Symptomatic but completely ambulatory  Blood pressure (!) 159/45, pulse 72, temperature 97.7 F (36.5 C), temperature source Tympanic, resp. rate 19, height 5' (1.524 m), SpO2 95 %.  LABORATORY DATA: Lab Results  Component Value Date   WBC 7.1 07/18/2021   HGB 8.6 (L) 07/18/2021   HCT 27.6 (L) 07/18/2021   MCV 80.0 07/18/2021   PLT 176 07/18/2021      Chemistry      Component Value Date/Time   NA 136 07/18/2021 1324   NA 142 04/11/2016 1007   K 4.0 07/18/2021 1324   K 3.3 (L) 04/11/2016 1007   CL 99 07/18/2021 1324   CO2 30 07/18/2021 1324   CO2 31 (H) 04/11/2016 1007   BUN 48 (H) 07/18/2021 1324   BUN 24.4 04/11/2016 1007   CREATININE 1.61 (H) 07/18/2021 1324   CREATININE 1.36 (H) 02/04/2020 1443   CREATININE 1.1 04/11/2016 1007      Component Value Date/Time   CALCIUM 10.3 07/18/2021 1324   CALCIUM 9.8 04/11/2016 1007   ALKPHOS 46 07/18/2021 1324   ALKPHOS 82 04/11/2016 1007   AST 11 (L) 07/18/2021 1324   AST 12 04/11/2016 1007   ALT <5 07/18/2021 1324   ALT 12 04/11/2016 1007   BILITOT 0.3 07/18/2021 1324   BILITOT 0.35 04/11/2016 1007       RADIOGRAPHIC STUDIES: No results found.  ASSESSMENT AND PLAN: This is a very pleasant 79 years old with a stage IV (TX, N3, M1 C) non-small cell lung cancer, adenocarcinoma with positive EGFR mutation exon 21 (L858R) diagnosed in October 2022 1 presented with left primary lung lesion in addition to widespread metastatic disease in the thoracic nodes, left pleural space as well as abdominal pelvic lymph node and bone metastasis. The patient is currently undergoing treatment with Tagrisso 80 mg p.o. daily started June 21, 2021.  She is status post 1 month of treatment and has been tolerating her treatment with Tagrisso fairly well except for the  fatigue and skin rash under the breast and in the groin area. I recommended for the patient to continue her current treatment with Tagrisso with the same dose. I will see her back for follow-up visit in 1 months for evaluation with repeat CT scan of the chest, abdomen pelvis for restaging of her disease. For the skin rash, I will start the patient on ketoconazole cream twice daily. For the anemia she will continue with the oral iron tablets for now. She was advised to call immediately if she has any other concerning symptoms in the interval. The patient voices understanding of current disease status and treatment options and is in agreement  with the current care plan.  All questions were answered. The patient knows to call the clinic with any problems, questions or concerns. We can certainly see the patient much sooner if necessary.   Disclaimer: This note was dictated with voice recognition software. Similar sounding words can inadvertently be transcribed and may not be corrected upon review.

## 2021-07-18 NOTE — Progress Notes (Signed)
Nutrition Follow-up:  Patient receiving Tagrisso for metastatic non-small cell lung cancer.   Met with patient and sister in office. Patient reports poor appetite, recalls mostly drinking variety of nutrition shakes (fairlife, CIB, Ensure, Premier). Patient reports her last "actual meal" was Sunday when family prepared her birthday dinner. Patient ate small plate of roast beef, mashed potatoes, corn and piece of coconut cake. Yesterday patient had an Ensure for breakfast, side salad with boiled egg, ranch dressing for dinner and 4-5 cheese straws before going to bed. She likes boiled eggs but these make her gassy. Patient reports granddaughter changes ostomy bag 2x/week. She denies constipation. Patient reports bowels have been more loose recently. She reports intermittent episodes of nausea with vomiting.    Medications: ketoconazole, klor-con, zyloprim  Labs: Glucose 101, BUN 48, Cr 1.61  Anthropometrics: No new weight today (per chart, pt refused). Last weight 172 lb on 11/29  11/17 - 174 lb  11/15 - 171 lb  10/25 - 180 lb 6.4 oz    NUTRITION DIAGNOSIS: Suspect unintentional weight loss ongoing   INTERVENTION:  Reinforced importance of adequate nutrition to maintain weight/strength Encouraged eating small snacks, mini meals q2 hours - pt has handout with snack ideas Patient agrees to try shake recipes  Continue drinking Ensure Plus/equivalent, recommend 3 day with decreased intake - coupons provided Patient has contact information    MONITORING, EVALUATION, GOAL: weight trends, intake    NEXT VISIT: To be scheduled ~ 1 month

## 2021-07-24 ENCOUNTER — Encounter: Payer: Self-pay | Admitting: Internal Medicine

## 2021-07-24 ENCOUNTER — Telehealth: Payer: Self-pay

## 2021-07-24 NOTE — Telephone Encounter (Signed)
Phone call placed to patient to introduce Palliative care and to offer to schedule a visit. Patient receptive and verbal consent obtained. Scheduled for 08/01/21 @ 12:30pm with Laurie Hippo NP

## 2021-07-26 ENCOUNTER — Telehealth: Payer: Self-pay | Admitting: Internal Medicine

## 2021-07-26 NOTE — Telephone Encounter (Signed)
Sch per12/20 los, left msg

## 2021-08-01 ENCOUNTER — Other Ambulatory Visit: Payer: Medicare Other | Admitting: Family Medicine

## 2021-08-01 ENCOUNTER — Other Ambulatory Visit: Payer: Self-pay

## 2021-08-01 ENCOUNTER — Encounter: Payer: Self-pay | Admitting: Family Medicine

## 2021-08-01 VITALS — BP 160/60 | HR 72 | Temp 98.7°F | Resp 18

## 2021-08-01 DIAGNOSIS — N2889 Other specified disorders of kidney and ureter: Secondary | ICD-10-CM

## 2021-08-01 DIAGNOSIS — J9611 Chronic respiratory failure with hypoxia: Secondary | ICD-10-CM

## 2021-08-01 DIAGNOSIS — D649 Anemia, unspecified: Secondary | ICD-10-CM

## 2021-08-01 DIAGNOSIS — J432 Centrilobular emphysema: Secondary | ICD-10-CM

## 2021-08-01 DIAGNOSIS — C799 Secondary malignant neoplasm of unspecified site: Secondary | ICD-10-CM | POA: Diagnosis not present

## 2021-08-01 DIAGNOSIS — Z7189 Other specified counseling: Secondary | ICD-10-CM | POA: Diagnosis not present

## 2021-08-01 DIAGNOSIS — I5032 Chronic diastolic (congestive) heart failure: Secondary | ICD-10-CM

## 2021-08-01 NOTE — Progress Notes (Addendum)
[facility] Consult Note Telephone: [phone]  Fax: [phone]   Date of encounter: 08/01/21 12:54 PM PATIENT NAME: [name] [address] [place] [address]   [phone] (home)  DOB: [birth year] MRN: [id number] PRIMARY CARE PROVIDER:    [name], MD,  [place] [id number] [phone]  REFERRING PROVIDER:   [name], MD [address] [place],  [place] [address] [phone]  RESPONSIBLE PARTY:    Contact Information     Name Relation Home Work Mobile   [place] Granddaughter [phone]  [phone]   [place] Daughter [phone]  [phone]   [place], [place] [phone]  [phone]   [place], [place] [phone]  [phone]        I met face to face with patient and her sister in her home. Palliative Care was asked to follow this patient by consultation request of  [name], MD to address advance care planning and complex medical decision making. This is the initial visit.                                     ASSESSMENT, SYMPTOM MANAGEMENT AND PLAN / RECOMMENDATIONS:   Advance Care Planning-Pt with diagnostic tests still pending and not ready to commit to advance care planning.  Aware of time limited diagnosis but continues to desire active treatment and is unsure if she wants to avail herself of Palliative Services at the present time.  Left blank DNR and MOST documents for pt to review if she wants and discuss with treating physician. Follow up in 2 weeks to see if pt wants Palliative Services. Caregiver stress-Pt is primary caregiver for her spouse with advanced dementia and oldest daughter who has had a stroke and is dependent for care.  Has support of a local sister and her granddaughter (lives in the home with her). Metastatic lung cancer complicated by COPD, chronic respiratory failure and heart failure-Has upcoming PET scan but with  known abdominal pain, renal mass and metastatic lesion in right hip.  Continue O2 @ 2L/min. Anemia with recent bleeding through ostomy-hemodynamically stable with stable BP, good color, no tachypnea or tachycardia, lightheadedness.  Advance Care Planning/Goals of Care: Goals include to maximize life and symptom management. Patient gave her permission to discuss advance care planning.  Our advance care planning conversation included a discussion about:    The value and importance of advance care planning and having a designated spokesperson if she cannot speak for herself Experiences with loved ones who have been seriously ill or have died-Sister advises that she has COPD but has asked to be intubated "to be given a chance unless they can tell me that being on the breathing machine is permanent."  Advised that having severe COPD and chronic respiratory failure dramatically decreases rate of success for weaning but no one could absolutely know whether or not a wean would be successful.   Exploration of personal, cultural or spiritual beliefs that might influence medical decisions.  Sister states she wants pain control for patient and "we are hoping for a miracle!  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 Exploration of goals of care in the event of a sudden injury or illness-reviewed options on MOST form with patient and advised that she does not have to complete it if she does not want to do so and  that she should take time and discuss with her family and treating physician Identification and preparation of a healthcare agent Review and updating or creation of an advance directive document . Decision not to resuscitate or to de-escalate disease focused treatments due to poor prognosis. Pt is not ready to consider at present.  CODE STATUS: Full Code at present    Follow up Palliative Care Visit: Palliative care will continue to follow for complex medical decision making,  advance care planning, and clarification of goals. Phone follow up in 2 weeks or prn.  I spent 80 minutes providing this consultation of which 30 minutes spent in ACP from 1-1:30 pm. More than 50% of the time in this consultation was spent in counseling and care coordination.  This visit was coded based on medical decision making (MDM).  PPS: 50%  HOSPICE ELIGIBILITY/DIAGNOSIS: TBD  Chief Complaint:  Initial referral for metastatic lung cancer with involvement of the kidney and bony mets to right hip.    HISTORY OF PRESENT ILLNESS:  [name] is a 81 y.o. year old female with Metastic lung cancer to kidney and left hip.  She has had a significant left pleural effusion which has had to be drained multiple times.  Alvis Lemmings has nurse coming out to help with draining fluid from lung.  Appetite is poor.  She is feeling weak, no energy and fatigued.  She was unable to work with PT and OT with Alvis Lemmings due to extreme fatigue. Weight today 164.2. Has a colostomy created due to colectomy for abscess. On January 15th to have a PET scan prior to January 18th appointment with Dr Mora Appl, her Oncologist.  Getting treatment with Tagrisso currently (oral chemotherapy agent).  Refused radiation at present, stating she didn't think she was strong enough to endure it right now.  States she was told without treatment she was looking at a life expectancy of 3-6 months but is hopeful that treatment will extend that time. At times she has pain, mainly in her stomach and right hip with bony lesion. Cleans her ostomy bag herself and Lauren changes it. At times going to the bathroom she is having pain in her right hip and abdomen when heading to the bathroom. Husband has advanced dementia and her oldest daughter has no short term memory has to be cared for post stroke.  Capillaries have bled in colon previously and had episode of bleeding a week or so ago where she had it filling the ostomy bag but had no more after that.   She didn't want to go to the hospital "wanting to tough it out" if she could because she states she knew that Dr Earlie Server her oncologist would want to check her labs. She states when initially the doctors evaluated her for the kidney mass they told her it was cancer but didn't think she would survive the surgery. She seems to be unclear prior to my investigation whether these were 2 primary cancers or if one was metastatic from the other.  History obtained from review of EMR, discussion with primary team, and interview with family,  and Ms. Lundquist.  I reviewed available labs, medications, imaging, studies and related documents from the EMR.  Records reviewed and summarized above.   ROS  General: NAD, fatigue and malaise but pushes herself to cook/care for her dependent daughter and spouse EYES: denies vision changes ENMT: denies dysphagia Cardiovascular: denies chest pain, endorses DOE Pulmonary: denies cough, increased SOB Abdomen: endorses poor appetite,  has permanent  ostomy GU: denies dysuria, endorses continence of urine MSK:  endorses weakness, no recent falls reported Skin: denies rashes or wounds Neurological: endorses abdominal pain/low back pain and right hip pain, denies insomnia Psych: Endorses fatigue and caregiver exhaustion due to demands on her in caring for spouse and daughter (which she wants to do) Heme/lymph/immuno: denies bruises, abnormal bleeding  Physical Exam: (gave consent for exam today) Current and past weights: Weighs daily, today's weight 164.2 Constitutional: NAD, appears fatigued General: frail appearing, obese  EYES: anicteric sclera, lids intact, no discharge  ENMT: intact hearing, oral mucous membranes moist, dentition intact CV: S1S2, irreg rate and rhythm, audible murmur noted at left sternal border and radiating across precordium, trace LE edema bilat Pulmonary: Coarse friction rub noted along left lower lung field, no increased work of breathing, no  cough, on O2 @ 2L Abdomen: normo-active BS + 4 quadrants, soft and non tender, no ascites GU: deferred MSK: noted sarcopenia, moves all extremities, ambulatory with assist Skin: warm and dry, no rashes or wounds on visible skin, scattered ecchymoses Neuro:  generalized weakness,  no cognitive impairment Psych: mildly anxious affect, A and O x 3 Hem/lymph/immuno: no widespread bruising CURRENT PROBLEM LIST:  Patient Active Problem List   Diagnosis Date Noted   Renal mass 06/28/2021   Adenocarcinoma of left lung, stage 4 ([facility]) 06/08/2021   Metastatic adenocarcinoma ([facility]) 05/21/2021   Normocytic anemia 05/04/2021   Irreducible ventral hernia 08/09/2020   COPD (chronic obstructive pulmonary disease) ([facility]) 08/09/2020   Chronic respiratory failure with hypoxia ([facility]) 02/06/2020   Routine general medical examination at a health care facility 02/06/2020   AVM (arteriovenous malformation) of small bowel, acquired with hemorrhage 08/14/2018   Stenosis of cervical spine with myelopathy (HCC) 01/01/2018   Atrial fibrillation ([facility]) 10/17/2017   CHF (congestive heart failure), NYHA class III, chronic, diastolic (HCC) 05/39/7673   Angiodysplasia of cecum    Angiodysplasia of duodenum    Grade III diastolic dysfunction 41/93/7902   Dysuria 08/08/2017   MGUS (monoclonal gammopathy of unknown significance) 11/08/2016   Asteatotic eczema 02/25/2016   COLD (chronic obstructive lung disease) (HCC)    Anemia, iron deficiency 11/16/2014   Cor pulmonale (HCC) 11/16/2014   OSA (obstructive sleep apnea) 06/22/2013   Chronic venous stasis dermatitis of both lower extremities 06/02/2013   Vitamin D deficiency 02/06/2013   Osteopenia, senile 02/05/2013   Other screening mammogram 02/05/2013   Chronic kidney disease, stage 3b ([facility]) 06/20/2010   Morbid obesity ([facility]) 04/19/2010   Gout 01/10/2009   ULCERATIVE COLITIS-LEFT SIDE 03/19/2008   Vitamin B12 deficiency anemia 11/13/2006   Hyperlipidemia with target  LDL less than 100 11/27/1996   GERD 07/30/1992   HTN (hypertension) 07/30/1988   PAST MEDICAL HISTORY:  Active Ambulatory Problems    Diagnosis Date Noted   Vitamin B12 deficiency anemia 11/13/2006   Hyperlipidemia with target LDL less than 100 11/27/1996   Gout 01/10/2009   Morbid obesity ([facility]) 04/19/2010   HTN (hypertension) 07/30/1988   GERD 07/30/1992   ULCERATIVE COLITIS-LEFT SIDE 03/19/2008   Chronic kidney disease, stage 3b ([facility]) 06/20/2010   Osteopenia, senile 02/05/2013   Other screening mammogram 02/05/2013   Vitamin D deficiency 02/06/2013   Chronic venous stasis dermatitis of both lower extremities 06/02/2013   OSA (obstructive sleep apnea) 06/22/2013   Anemia, iron deficiency 11/16/2014   Cor pulmonale (HCC) 11/16/2014   COLD (chronic obstructive lung disease) ([facility])    Asteatotic eczema 02/25/2016   MGUS (monoclonal gammopathy of unknown significance) 11/08/2016  Grade III diastolic dysfunction 60/73/7106   Dysuria 08/08/2017   Angiodysplasia of cecum    Angiodysplasia of duodenum    CHF (congestive heart failure), NYHA class III, chronic, diastolic (HCC) 26/94/8546   Atrial fibrillation ([facility]) 10/17/2017   Stenosis of cervical spine with myelopathy ([facility]) 01/01/2018   AVM (arteriovenous malformation) of small bowel, acquired with hemorrhage 08/14/2018   Chronic respiratory failure with hypoxia ([facility]) 02/06/2020   Routine general medical examination at a health care facility 02/06/2020   Irreducible ventral hernia 08/09/2020   COPD (chronic obstructive pulmonary disease) ([facility]) 08/09/2020   Normocytic anemia 05/04/2021   Metastatic adenocarcinoma ([facility]) 05/21/2021   Adenocarcinoma of left lung, stage 4 ([facility]) 06/08/2021   Renal mass 06/28/2021   Resolved Ambulatory Problems    Diagnosis Date Noted   CHRONIC OBSTRUCTIVE PULMONARY DISEASE, ACUTE EXACERBATION 02/03/2010   COPD exacerbation ([facility]) 27/09/5007   Umbilical hernia with obstruction 03/19/2008    DIVERTICULITIS, COLON 04/20/2008   UTI 01/16/2010   CELLULITIS, LEFT LEG 06/06/2010   FASCIITIS, PLANTAR 11/17/2008   Bilateral lower extremity edema 11/13/2006   CAROTID BRUIT 04/19/2010   DYSPNEA 12/15/2008   Chest pain at rest 03/22/2010   Prediabetes 11/13/2006   COLONIC POLYPS, ADENOMATOUS, HX OF 02/02/2008   TINEA CRURIS 07/27/2010   DIVERTICULOSIS OF COLON 07/27/2010   DYSFUNCTIONAL UTERINE BLEEDING 07/27/2010   Abdominal pain, generalized 07/27/2010   COPD with exacerbation ([facility]) 04/24/2011   URI, acute 04/24/2011   Muscle cramps at night 06/20/2011   Wound infection 09/25/2011   Knee pain, right 09/25/2011   Ulcer of leg, chronic, right ([facility]) 10/10/2011   Leukocytosis 10/10/2011   Dyspnea 12/03/2011   Cellulitis of both lower extremities 02/05/2013   Leukocytosis, unspecified 02/19/2013   Monoclonal paraproteinemia 02/23/2013   Acute on chronic diastolic CHF (congestive heart failure) ([facility]) 06/02/2013   Acute-on-chronic respiratory failure ([facility]) 06/04/2013   Palpitation 08/31/2013   Acute right flank pain 04/22/2014   Respiratory distress 11/16/2014   Acute respiratory failure with hypoxia ([facility]) 11/16/2014   Acute bronchiolitis due to human metapneumovirus 11/18/2014   Venous stasis ulcer of lower extremity ([facility]) 11/19/2014   Blood in stool 11/25/2014   Heme positive stool 02/18/2015   Microcytic anemia 02/18/2015   Rectal bleeding    Benign neoplasm of ascending colon    Cough 07/05/2015   Bilateral pneumonia 07/05/2015   RTI (respiratory tract infection) 08/17/2015   Hip pain 02/23/2016   Lower GI bleed 03/19/2016   Acute on chronic diastolic CHF (congestive heart failure) ([facility]) 03/19/2016   Diabetes mellitus ([facility]) 03/19/2016   Acute tonsillitis due to other specified organisms 09/04/2016   AKI (acute kidney injury) ([facility]) 11/08/2016   Injury of left shoulder 11/21/2016   Pulmonary edema 08/08/2017   Heme positive stool    GI bleed 08/10/2017   Symptomatic  anemia 08/10/2017   Hypertensive urgency 08/10/2017   Hypertensive emergency    Acute kidney injury superimposed on CKD ([facility]) Stage III 08/12/2017   Yeast vaginitis 08/20/2017   Shingles 38/18/2993   Acute systolic congestive heart failure ([facility]) 10/17/2017   Acute CHF (congestive heart failure) ([facility]) 71/69/6789   Acute diastolic HF (heart failure), NYHA class 1 ([facility]) 10/17/2017   Atrial fibrillation with RVR ([facility]) 10/17/2017   Gout attack 10/19/2017   Neck pain on right side 01/01/2018   D-dimer, elevated 01/01/2018   Multiple lung nodules on CT 05/05/2018   Hematochezia 08/04/2018   Anemia due to chronic blood loss    Hernia cerebri ([facility]) 08/12/2018  Abdominal pain 08/12/2018   Hernia, ventral 38/93/7342   Umbilical hernia with obstruction 08/27/2018   Hemicrania continua 09/24/2018   Ataxia 09/24/2018   Left lower quadrant abdominal pain 12/29/2018   Bandemia 02/06/2020   Left lower quadrant abdominal tenderness without rebound tenderness 02/06/2020   Acute GI bleeding 05/07/2020   GI bleed 05/08/2020   Infection as cause of abscess of colon 08/09/2020   Shock circulatory ([facility])    Dysuria 09/28/2020   Subacute cough 05/03/2021   Pleural effusion, left 05/03/2021   Pleural effusion on left 05/04/2021   Pleural effusion 05/05/2021   Past Medical History:  Diagnosis Date   Allergy    Arthritis    Asthma    Blood transfusion without reported diagnosis    Cancer ([facility])    Cataract    CHF (congestive heart failure), NYHA class III, chronic, combined ([facility]) 08/10/2017   Colonic polyp 02/16/2008   Gout    Kidney stone 1960, 1972, 1991   Renal insufficiency    SOCIAL HX:  Social History   Tobacco Use   Smoking status: Former    Types: Cigarettes    Quit date: 06/01/1996    Years since quitting: 25.1   Smokeless tobacco: Never   Tobacco comments:    Quit 1998  Substance Use Topics   Alcohol use: No   FAMILY HX:  Family History  Problem Relation Age of Onset    Stroke Mother    Diabetes Mother    Kidney cancer Mother    Stomach cancer Mother    Heart disease Mother    Stroke Father    Heart disease Brother    Heart disease Brother    Crohn's disease Brother    Heart disease Sister        CATH,STENT   Clotting disorder Sister    Cervical cancer Sister    Lung cancer Sister    Heart attack Sister    Diabetes Sister    Colon cancer Neg Hx    Esophageal cancer Neg Hx    Rectal cancer Neg Hx       ALLERGIES:  Allergies  Allergen Reactions   Celebrex [Celecoxib] Swelling   Amlodipine Besylate Swelling   Enalapril Cough   Lipitor [Atorvastatin]     Muscle aches   Amoxicillin Diarrhea    DID THE REACTION INVOLVE: Swelling of the face/tongue/throat, SOB, or low BP? N Sudden or severe rash/hives, skin peeling, or the inside of the mouth or nose? N Did it require medical treatment? N When did it last happen?      MORE THAN 10 YEARS If all above answers are "NO", may proceed with cephalosporin use.    Codeine Sulfate Nausea Only   Hydrocodone-Acetaminophen Nausea Only     PERTINENT MEDICATIONS:  Outpatient Encounter Medications as of 08/01/2021  Medication Sig   acetaminophen (TYLENOL) 325 MG tablet Take 325-650 mg by mouth daily as needed for moderate pain, fever or headache.    albuterol (PROAIR HFA) 108 (90 Base) MCG/ACT inhaler Inhale 1-2 puffs into the lungs every 6 (six) hours as needed for wheezing or shortness of breath.   allopurinol (ZYLOPRIM) 100 MG tablet TAKE 1 TABLET(100 MG) BY MOUTH DAILY   clonazePAM (KLONOPIN) 1 MG tablet Take 1 tablet (1 mg total) by mouth 2 (two) times daily as needed for anxiety.   ferrous sulfate 325 (65 FE) MG tablet Take 1 tablet (325 mg total) by mouth 2 (two) times daily with a meal.   Fluticasone-Umeclidin-Vilant (  TRELEGY ELLIPTA) 100-62.5-25 MCG/INH AEPB Inhale 1 puff into the lungs daily.   isosorbide-hydrALAZINE (BIDIL) 20-37.5 MG tablet TAKE 1 TABLET BY MOUTH THREE TIMES DAILY   ketoconazole  (NIZORAL) 2 % cream Apply 1 application topically daily.   metoprolol tartrate (LOPRESSOR) 25 MG tablet TAKE 1 TABLET(25 MG) BY MOUTH TWICE DAILY (Patient taking differently: Take 12.5 mg by mouth 2 (two) times daily.)   morphine (MS CONTIN) 15 MG 12 hr tablet Take 1 tablet (15 mg total) by mouth 2 (two) times daily as needed for pain.   osimertinib mesylate (TAGRISSO) 80 MG tablet Take 1 tablet (80 mg total) by mouth daily.   potassium chloride SA (KLOR-CON M) 20 MEQ tablet Take 1 tablet (20 mEq total) by mouth daily.   prochlorperazine (COMPAZINE) 10 MG tablet Take 1 tablet (10 mg total) by mouth every 6 (six) hours as needed.   torsemide (DEMADEX) 20 MG tablet TAKE 2 TABLETS(40 MG) BY MOUTH EVERY MORNING   Facility-Administered Encounter Medications as of 08/01/2021  Medication   cyanocobalamin ((VITAMIN B-12)) injection 1,000 mcg   Thank you for the opportunity to participate in the care of Ms. Grine.  The palliative care team will continue to follow. Please call our office at [phone] if we can be of additional assistance.   Marijo Conception, FNP-C   COVID-19 PATIENT SCREENING TOOL Asked and negative response unless otherwise noted:  Have you had symptoms of covid, tested positive or been in contact with someone with symptoms/positive test in the past 5-10 days? no

## 2021-08-02 DIAGNOSIS — I700 Atherosclerosis of aorta: Secondary | ICD-10-CM | POA: Diagnosis not present

## 2021-08-02 DIAGNOSIS — I5042 Chronic combined systolic (congestive) and diastolic (congestive) heart failure: Secondary | ICD-10-CM | POA: Diagnosis not present

## 2021-08-02 DIAGNOSIS — M85832 Other specified disorders of bone density and structure, left forearm: Secondary | ICD-10-CM | POA: Diagnosis not present

## 2021-08-02 DIAGNOSIS — M19071 Primary osteoarthritis, right ankle and foot: Secondary | ICD-10-CM | POA: Diagnosis not present

## 2021-08-02 DIAGNOSIS — J9621 Acute and chronic respiratory failure with hypoxia: Secondary | ICD-10-CM | POA: Diagnosis not present

## 2021-08-02 DIAGNOSIS — D472 Monoclonal gammopathy: Secondary | ICD-10-CM | POA: Diagnosis not present

## 2021-08-02 DIAGNOSIS — N1832 Chronic kidney disease, stage 3b: Secondary | ICD-10-CM | POA: Diagnosis not present

## 2021-08-02 DIAGNOSIS — G4733 Obstructive sleep apnea (adult) (pediatric): Secondary | ICD-10-CM | POA: Diagnosis not present

## 2021-08-02 DIAGNOSIS — D630 Anemia in neoplastic disease: Secondary | ICD-10-CM | POA: Diagnosis not present

## 2021-08-02 DIAGNOSIS — M810 Age-related osteoporosis without current pathological fracture: Secondary | ICD-10-CM | POA: Diagnosis not present

## 2021-08-02 DIAGNOSIS — I878 Other specified disorders of veins: Secondary | ICD-10-CM | POA: Diagnosis not present

## 2021-08-02 DIAGNOSIS — M19072 Primary osteoarthritis, left ankle and foot: Secondary | ICD-10-CM | POA: Diagnosis not present

## 2021-08-02 DIAGNOSIS — I2781 Cor pulmonale (chronic): Secondary | ICD-10-CM | POA: Diagnosis not present

## 2021-08-02 DIAGNOSIS — Z48813 Encounter for surgical aftercare following surgery on the respiratory system: Secondary | ICD-10-CM | POA: Diagnosis not present

## 2021-08-02 DIAGNOSIS — M479 Spondylosis, unspecified: Secondary | ICD-10-CM | POA: Diagnosis not present

## 2021-08-02 DIAGNOSIS — Z4801 Encounter for change or removal of surgical wound dressing: Secondary | ICD-10-CM | POA: Diagnosis not present

## 2021-08-02 DIAGNOSIS — H539 Unspecified visual disturbance: Secondary | ICD-10-CM | POA: Diagnosis not present

## 2021-08-02 DIAGNOSIS — E785 Hyperlipidemia, unspecified: Secondary | ICD-10-CM | POA: Diagnosis not present

## 2021-08-02 DIAGNOSIS — D631 Anemia in chronic kidney disease: Secondary | ICD-10-CM | POA: Diagnosis not present

## 2021-08-02 DIAGNOSIS — J449 Chronic obstructive pulmonary disease, unspecified: Secondary | ICD-10-CM | POA: Diagnosis not present

## 2021-08-02 DIAGNOSIS — M1030 Gout due to renal impairment, unspecified site: Secondary | ICD-10-CM | POA: Diagnosis not present

## 2021-08-02 DIAGNOSIS — I130 Hypertensive heart and chronic kidney disease with heart failure and stage 1 through stage 4 chronic kidney disease, or unspecified chronic kidney disease: Secondary | ICD-10-CM | POA: Diagnosis not present

## 2021-08-02 DIAGNOSIS — M4802 Spinal stenosis, cervical region: Secondary | ICD-10-CM | POA: Diagnosis not present

## 2021-08-02 DIAGNOSIS — C3490 Malignant neoplasm of unspecified part of unspecified bronchus or lung: Secondary | ICD-10-CM | POA: Diagnosis not present

## 2021-08-02 DIAGNOSIS — D519 Vitamin B12 deficiency anemia, unspecified: Secondary | ICD-10-CM | POA: Diagnosis not present

## 2021-08-09 DIAGNOSIS — E785 Hyperlipidemia, unspecified: Secondary | ICD-10-CM | POA: Diagnosis not present

## 2021-08-09 DIAGNOSIS — M810 Age-related osteoporosis without current pathological fracture: Secondary | ICD-10-CM | POA: Diagnosis not present

## 2021-08-09 DIAGNOSIS — G4733 Obstructive sleep apnea (adult) (pediatric): Secondary | ICD-10-CM | POA: Diagnosis not present

## 2021-08-09 DIAGNOSIS — Z4801 Encounter for change or removal of surgical wound dressing: Secondary | ICD-10-CM | POA: Diagnosis not present

## 2021-08-09 DIAGNOSIS — M19072 Primary osteoarthritis, left ankle and foot: Secondary | ICD-10-CM | POA: Diagnosis not present

## 2021-08-09 DIAGNOSIS — I700 Atherosclerosis of aorta: Secondary | ICD-10-CM | POA: Diagnosis not present

## 2021-08-09 DIAGNOSIS — M1030 Gout due to renal impairment, unspecified site: Secondary | ICD-10-CM | POA: Diagnosis not present

## 2021-08-09 DIAGNOSIS — D631 Anemia in chronic kidney disease: Secondary | ICD-10-CM | POA: Diagnosis not present

## 2021-08-09 DIAGNOSIS — I5042 Chronic combined systolic (congestive) and diastolic (congestive) heart failure: Secondary | ICD-10-CM | POA: Diagnosis not present

## 2021-08-09 DIAGNOSIS — Z48813 Encounter for surgical aftercare following surgery on the respiratory system: Secondary | ICD-10-CM | POA: Diagnosis not present

## 2021-08-09 DIAGNOSIS — J910 Malignant pleural effusion: Secondary | ICD-10-CM | POA: Diagnosis not present

## 2021-08-09 DIAGNOSIS — D472 Monoclonal gammopathy: Secondary | ICD-10-CM | POA: Diagnosis not present

## 2021-08-09 DIAGNOSIS — M4802 Spinal stenosis, cervical region: Secondary | ICD-10-CM | POA: Diagnosis not present

## 2021-08-09 DIAGNOSIS — D630 Anemia in neoplastic disease: Secondary | ICD-10-CM | POA: Diagnosis not present

## 2021-08-09 DIAGNOSIS — M19071 Primary osteoarthritis, right ankle and foot: Secondary | ICD-10-CM | POA: Diagnosis not present

## 2021-08-09 DIAGNOSIS — M85832 Other specified disorders of bone density and structure, left forearm: Secondary | ICD-10-CM | POA: Diagnosis not present

## 2021-08-09 DIAGNOSIS — H539 Unspecified visual disturbance: Secondary | ICD-10-CM | POA: Diagnosis not present

## 2021-08-09 DIAGNOSIS — M479 Spondylosis, unspecified: Secondary | ICD-10-CM | POA: Diagnosis not present

## 2021-08-09 DIAGNOSIS — I2781 Cor pulmonale (chronic): Secondary | ICD-10-CM | POA: Diagnosis not present

## 2021-08-09 DIAGNOSIS — J449 Chronic obstructive pulmonary disease, unspecified: Secondary | ICD-10-CM | POA: Diagnosis not present

## 2021-08-09 DIAGNOSIS — N1832 Chronic kidney disease, stage 3b: Secondary | ICD-10-CM | POA: Diagnosis not present

## 2021-08-09 DIAGNOSIS — I130 Hypertensive heart and chronic kidney disease with heart failure and stage 1 through stage 4 chronic kidney disease, or unspecified chronic kidney disease: Secondary | ICD-10-CM | POA: Diagnosis not present

## 2021-08-09 DIAGNOSIS — C3490 Malignant neoplasm of unspecified part of unspecified bronchus or lung: Secondary | ICD-10-CM | POA: Diagnosis not present

## 2021-08-09 DIAGNOSIS — D519 Vitamin B12 deficiency anemia, unspecified: Secondary | ICD-10-CM | POA: Diagnosis not present

## 2021-08-09 DIAGNOSIS — J9621 Acute and chronic respiratory failure with hypoxia: Secondary | ICD-10-CM | POA: Diagnosis not present

## 2021-08-09 DIAGNOSIS — I878 Other specified disorders of veins: Secondary | ICD-10-CM | POA: Diagnosis not present

## 2021-08-10 ENCOUNTER — Telehealth: Payer: Self-pay

## 2021-08-10 NOTE — Progress Notes (Signed)
° ° °  Chronic Care Management Pharmacy Assistant   Name: [name]  MRN: [id number] DOB: 03-04-1941   Reason for Encounter: Disease State-General Call    Recent office visits:  06/27/21 Hoyt Koch, MD-PCP (Dysuria) Meds: fluconazole  Recent consult visits:  07/18/21 Curt Bears, MD-Oncology (Malignant neoplasm of unspecified part of unspecified bronchus or lung) Med changes: start  ketoconazole cream twice daily 06/27/21 Curt Bears, MD-Oncology (Adenocarcinoma of left lung) Hospital visits:  None in previous 6 months  Medications: Outpatient Encounter Medications as of 08/10/2021  Medication Sig Note   acetaminophen (TYLENOL) 325 MG tablet Take 325-650 mg by mouth daily as needed for moderate pain, fever or headache.     albuterol (PROAIR HFA) 108 (90 Base) MCG/ACT inhaler Inhale 1-2 puffs into the lungs every 6 (six) hours as needed for wheezing or shortness of breath.    allopurinol (ZYLOPRIM) 100 MG tablet TAKE 1 TABLET(100 MG) BY MOUTH DAILY    clonazePAM (KLONOPIN) 1 MG tablet Take 1 tablet (1 mg total) by mouth 2 (two) times daily as needed for anxiety. 06/27/2021: Pt instructed -do not take Kkonipin at same time as MS contin .   ferrous sulfate 325 (65 FE) MG tablet Take 1 tablet (325 mg total) by mouth 2 (two) times daily with a meal.    Fluticasone-Umeclidin-Vilant (TRELEGY ELLIPTA) 100-62.5-25 MCG/INH AEPB Inhale 1 puff into the lungs daily.    isosorbide-hydrALAZINE (BIDIL) 20-37.5 MG tablet TAKE 1 TABLET BY MOUTH THREE TIMES DAILY    ketoconazole (NIZORAL) 2 % cream Apply 1 application topically daily.    metoprolol tartrate (LOPRESSOR) 25 MG tablet TAKE 1 TABLET(25 MG) BY MOUTH TWICE DAILY (Patient taking differently: Take 12.5 mg by mouth 2 (two) times daily.)    morphine (MS CONTIN) 15 MG 12 hr tablet Take 1 tablet (15 mg total) by mouth 2 (two) times daily as needed for pain.    osimertinib mesylate (TAGRISSO) 80 MG tablet Take 1 tablet (80 mg  total) by mouth daily. 06/08/2021: Has not picked up yet   potassium chloride SA (KLOR-CON M) 20 MEQ tablet Take 1 tablet (20 mEq total) by mouth daily.    prochlorperazine (COMPAZINE) 10 MG tablet Take 1 tablet (10 mg total) by mouth every 6 (six) hours as needed. 06/08/2021: Has not picked up yet   torsemide (DEMADEX) 20 MG tablet TAKE 2 TABLETS(40 MG) BY MOUTH EVERY MORNING    Facility-Administered Encounter Medications as of 08/10/2021  Medication   cyanocobalamin ((VITAMIN B-12)) injection 1,000 mcg   Reviewed chart for medication changes and drug therapy problems ahead of medication adherence call.  Attempted to contact patient x 3 for medication review and health check, unable to reach patient, left voicemails to return call.     Care Gaps: Colonoscopy-NA Diabetic Foot Exam-11/05/17 Mammogram-NA Ophthalmology-09/23/18 Dexa Scan - NA Annual Well Visit - NA Micro albumin-11/05/17 Hemoglobin A1c- 11/05/17  Star Rating Drugs: None ID  [facility] Pharmacist Assistant [phone]

## 2021-08-14 ENCOUNTER — Telehealth: Payer: Self-pay | Admitting: *Deleted

## 2021-08-14 ENCOUNTER — Other Ambulatory Visit: Payer: Self-pay | Admitting: Internal Medicine

## 2021-08-14 ENCOUNTER — Other Ambulatory Visit: Payer: Self-pay

## 2021-08-14 ENCOUNTER — Inpatient Hospital Stay: Payer: Medicare Other | Attending: Internal Medicine

## 2021-08-14 ENCOUNTER — Other Ambulatory Visit: Payer: Self-pay | Admitting: Physician Assistant

## 2021-08-14 ENCOUNTER — Other Ambulatory Visit: Payer: Self-pay | Admitting: *Deleted

## 2021-08-14 ENCOUNTER — Ambulatory Visit (HOSPITAL_COMMUNITY)
Admission: RE | Admit: 2021-08-14 | Discharge: 2021-08-14 | Disposition: A | Discharge status: Home / Self Care | Payer: Medicare Other | Source: Ambulatory Visit | Attending: Internal Medicine | Admitting: Internal Medicine

## 2021-08-14 DIAGNOSIS — C3492 Malignant neoplasm of unspecified part of left bronchus or lung: Secondary | ICD-10-CM

## 2021-08-14 DIAGNOSIS — D649 Anemia, unspecified: Secondary | ICD-10-CM | POA: Insufficient documentation

## 2021-08-14 DIAGNOSIS — J439 Emphysema, unspecified: Secondary | ICD-10-CM | POA: Diagnosis not present

## 2021-08-14 DIAGNOSIS — C7951 Secondary malignant neoplasm of bone: Secondary | ICD-10-CM | POA: Insufficient documentation

## 2021-08-14 DIAGNOSIS — C772 Secondary and unspecified malignant neoplasm of intra-abdominal lymph nodes: Secondary | ICD-10-CM | POA: Insufficient documentation

## 2021-08-14 DIAGNOSIS — C775 Secondary and unspecified malignant neoplasm of intrapelvic lymph nodes: Secondary | ICD-10-CM | POA: Insufficient documentation

## 2021-08-14 DIAGNOSIS — C3490 Malignant neoplasm of unspecified part of unspecified bronchus or lung: Secondary | ICD-10-CM

## 2021-08-14 DIAGNOSIS — R918 Other nonspecific abnormal finding of lung field: Secondary | ICD-10-CM | POA: Diagnosis not present

## 2021-08-14 DIAGNOSIS — J9811 Atelectasis: Secondary | ICD-10-CM | POA: Diagnosis not present

## 2021-08-14 DIAGNOSIS — I509 Heart failure, unspecified: Secondary | ICD-10-CM | POA: Insufficient documentation

## 2021-08-14 DIAGNOSIS — R21 Rash and other nonspecific skin eruption: Secondary | ICD-10-CM | POA: Insufficient documentation

## 2021-08-14 DIAGNOSIS — R590 Localized enlarged lymph nodes: Secondary | ICD-10-CM | POA: Insufficient documentation

## 2021-08-14 DIAGNOSIS — I110 Hypertensive heart disease with heart failure: Secondary | ICD-10-CM | POA: Insufficient documentation

## 2021-08-14 DIAGNOSIS — N281 Cyst of kidney, acquired: Secondary | ICD-10-CM | POA: Diagnosis not present

## 2021-08-14 DIAGNOSIS — I700 Atherosclerosis of aorta: Secondary | ICD-10-CM | POA: Diagnosis not present

## 2021-08-14 LAB — CMP (CANCER CENTER ONLY)
ALT: <5 U/L (ref 0–44)
AST: 9 U/L — ABNORMAL LOW (ref 15–41)
Albumin: 3.6 g/dL (ref 3.5–5.0)
Alkaline Phosphatase: 39 U/L (ref 38–126)
Anion gap: 7 (ref 5–15)
BUN: 63 mg/dL — ABNORMAL HIGH (ref 8–23)
CO2: 28 mmol/L (ref 22–32)
Calcium: 9.7 mg/dL (ref 8.9–10.3)
Chloride: 103 mmol/L (ref 98–111)
Creatinine: 1.67 mg/dL — ABNORMAL HIGH (ref 0.44–1.00)
GFR, Estimated: 31 mL/min — ABNORMAL LOW (ref >60)
Glucose, Bld: 94 mg/dL (ref 70–99)
Potassium: 4 mmol/L (ref 3.5–5.1)
Sodium: 138 mmol/L (ref 135–145)
Total Bilirubin: 0.2 mg/dL — ABNORMAL LOW (ref 0.3–1.2)
Total Protein: 7.2 g/dL (ref 6.5–8.1)

## 2021-08-14 LAB — CBC WITH DIFFERENTIAL (CANCER CENTER ONLY)
Abs Immature Granulocytes: 0.02 10*3/uL (ref 0.00–0.07)
Basophils Absolute: 0 10*3/uL (ref 0.0–0.1)
Basophils Relative: 1 %
Eosinophils Absolute: 0.1 10*3/uL (ref 0.0–0.5)
Eosinophils Relative: 2 %
HCT: 22.9 % — ABNORMAL LOW (ref 36.0–46.0)
Hemoglobin: 6.8 g/dL — CL (ref 12.0–15.0)
Immature Granulocytes: 0 %
Lymphocytes Relative: 13 %
Lymphs Abs: 1 10*3/uL (ref 0.7–4.0)
MCH: 26.6 pg (ref 26.0–34.0)
MCHC: 29.7 g/dL — ABNORMAL LOW (ref 30.0–36.0)
MCV: 89.5 fL (ref 80.0–100.0)
Monocytes Absolute: 0.7 10*3/uL (ref 0.1–1.0)
Monocytes Relative: 9 %
Neutro Abs: 5.9 10*3/uL (ref 1.7–7.7)
Neutrophils Relative %: 75 %
Platelet Count: 164 10*3/uL (ref 150–400)
RBC: 2.56 MIL/uL — ABNORMAL LOW (ref 3.87–5.11)
RDW: 18.1 % — ABNORMAL HIGH (ref 11.5–15.5)
WBC Count: 7.8 10*3/uL (ref 4.0–10.5)
nRBC: 0 % (ref 0.0–0.2)

## 2021-08-14 LAB — IRON AND IRON BINDING CAPACITY (CC-WL,HP ONLY)
Iron: 109 ug/dL (ref 28–170)
Saturation Ratios: 32 % — ABNORMAL HIGH (ref 10.4–31.8)
TIBC: 342 ug/dL (ref 250–450)
UIBC: 233 ug/dL (ref 148–442)

## 2021-08-14 LAB — FERRITIN: Ferritin: 46 ng/mL (ref 11–307)

## 2021-08-14 MED ORDER — IOHEXOL 300 MG/ML  SOLN
50.0000 mL | Freq: Once | INTRAMUSCULAR | Status: AC | PRN
  Administered 2021-08-14: 50 mL via INTRAVENOUS

## 2021-08-14 NOTE — Telephone Encounter (Signed)
Lab called with Hgb 6.8. Called patient while in CT. She will come tomorrow at 1000 for lab and 1100 transfusion. Orders placed for lab and transfusion.

## 2021-08-15 ENCOUNTER — Inpatient Hospital Stay: Payer: Medicare Other

## 2021-08-15 ENCOUNTER — Other Ambulatory Visit: Payer: Self-pay | Admitting: Physician Assistant

## 2021-08-15 DIAGNOSIS — C7951 Secondary malignant neoplasm of bone: Secondary | ICD-10-CM | POA: Diagnosis not present

## 2021-08-15 DIAGNOSIS — C3492 Malignant neoplasm of unspecified part of left bronchus or lung: Secondary | ICD-10-CM

## 2021-08-15 DIAGNOSIS — I509 Heart failure, unspecified: Secondary | ICD-10-CM | POA: Diagnosis not present

## 2021-08-15 DIAGNOSIS — D649 Anemia, unspecified: Secondary | ICD-10-CM

## 2021-08-15 DIAGNOSIS — R21 Rash and other nonspecific skin eruption: Secondary | ICD-10-CM | POA: Diagnosis not present

## 2021-08-15 DIAGNOSIS — C775 Secondary and unspecified malignant neoplasm of intrapelvic lymph nodes: Secondary | ICD-10-CM | POA: Diagnosis not present

## 2021-08-15 DIAGNOSIS — C772 Secondary and unspecified malignant neoplasm of intra-abdominal lymph nodes: Secondary | ICD-10-CM | POA: Diagnosis not present

## 2021-08-15 DIAGNOSIS — R590 Localized enlarged lymph nodes: Secondary | ICD-10-CM | POA: Diagnosis not present

## 2021-08-15 DIAGNOSIS — I110 Hypertensive heart disease with heart failure: Secondary | ICD-10-CM | POA: Diagnosis not present

## 2021-08-15 LAB — SAMPLE TO BLOOD BANK

## 2021-08-15 LAB — PREPARE RBC (CROSSMATCH)

## 2021-08-15 MED ORDER — DIPHENHYDRAMINE HCL 25 MG PO CAPS
25.0000 mg | ORAL_CAPSULE | Freq: Once | ORAL | Status: AC
  Administered 2021-08-15: 25 mg via ORAL
  Filled 2021-08-15: qty 1

## 2021-08-15 MED ORDER — ACETAMINOPHEN 325 MG PO TABS
650.0000 mg | ORAL_TABLET | Freq: Once | ORAL | Status: AC
  Administered 2021-08-15: 650 mg via ORAL
  Filled 2021-08-15: qty 2

## 2021-08-15 MED ORDER — SODIUM CHLORIDE 0.9% IV SOLUTION
250.0000 mL | Freq: Once | INTRAVENOUS | Status: AC
  Administered 2021-08-15: 250 mL via INTRAVENOUS

## 2021-08-15 NOTE — Patient Instructions (Signed)
Blood Transfusion, Adult, Care After This sheet gives you information about how to care for yourself after your procedure. Your doctor may also give you more specific instructions. If you have problems or questions, contact your doctor. What can I expect after the procedure? After the procedure, it is common to have: Bruising and soreness at the IV site. A headache. Follow these instructions at home: Insertion site care   Follow instructions from your doctor about how to take care of your insertion site. This is where an IV tube was put into your vein. Make sure you: Wash your hands with soap and water before and after you change your bandage (dressing). If you cannot use soap and water, use hand sanitizer. Change your bandage as told by your doctor. Check your insertion site every day for signs of infection. Check for: Redness, swelling, or pain. Bleeding from the site. Warmth. Pus or a bad smell. General instructions Take over-the-counter and prescription medicines only as told by your doctor. Rest as told by your doctor. Go back to your normal activities as told by your doctor. Keep all follow-up visits as told by your doctor. This is important. Contact a doctor if: You have itching or red, swollen areas of skin (hives). You feel worried or nervous (anxious). You feel weak after doing your normal activities. You have redness, swelling, warmth, or pain around the insertion site. You have blood coming from the insertion site, and the blood does not stop with pressure. You have pus or a bad smell coming from the insertion site. Get help right away if: You have signs of a serious reaction. This may be coming from an allergy or the body's defense system (immune system). Signs include: Trouble breathing or shortness of breath. Swelling of the face or feeling warm (flushed). Fever or chills. Head, chest, or back pain. Dark pee (urine) or blood in the pee. Widespread rash. Fast  heartbeat. Feeling dizzy or light-headed. You may receive your blood transfusion in an outpatient setting. If so, you will be told whom to contact to report any reactions. These symptoms may be an emergency. Do not wait to see if the symptoms will go away. Get medical help right away. Call your local emergency services (911 in the [place] Do not drive yourself to the hospital. Summary Bruising and soreness at the IV site are common. Check your insertion site every day for signs of infection. Rest as told by your doctor. Go back to your normal activities as told by your doctor. Get help right away if you have signs of a serious reaction. This information is not intended to replace advice given to you by your health care provider. Make sure you discuss any questions you have with your health care provider. Document Revised: 11/10/2020 Document Reviewed: 01/08/2019 [employer] Patient Education  2022 [employer]  

## 2021-08-16 ENCOUNTER — Inpatient Hospital Stay: Payer: Medicare Other

## 2021-08-16 ENCOUNTER — Encounter: Payer: Self-pay | Admitting: Internal Medicine

## 2021-08-16 ENCOUNTER — Inpatient Hospital Stay: Payer: Medicare Other | Admitting: Internal Medicine

## 2021-08-16 ENCOUNTER — Other Ambulatory Visit: Payer: Medicare Other

## 2021-08-16 ENCOUNTER — Other Ambulatory Visit: Payer: Self-pay

## 2021-08-16 VITALS — Wt 165.0 lb

## 2021-08-16 DIAGNOSIS — C775 Secondary and unspecified malignant neoplasm of intrapelvic lymph nodes: Secondary | ICD-10-CM | POA: Diagnosis not present

## 2021-08-16 DIAGNOSIS — C799 Secondary malignant neoplasm of unspecified site: Secondary | ICD-10-CM

## 2021-08-16 DIAGNOSIS — I509 Heart failure, unspecified: Secondary | ICD-10-CM | POA: Diagnosis not present

## 2021-08-16 DIAGNOSIS — Z5181 Encounter for therapeutic drug level monitoring: Secondary | ICD-10-CM

## 2021-08-16 DIAGNOSIS — D649 Anemia, unspecified: Secondary | ICD-10-CM

## 2021-08-16 DIAGNOSIS — C3492 Malignant neoplasm of unspecified part of left bronchus or lung: Secondary | ICD-10-CM | POA: Diagnosis not present

## 2021-08-16 DIAGNOSIS — C7951 Secondary malignant neoplasm of bone: Secondary | ICD-10-CM | POA: Diagnosis not present

## 2021-08-16 DIAGNOSIS — C772 Secondary and unspecified malignant neoplasm of intra-abdominal lymph nodes: Secondary | ICD-10-CM | POA: Diagnosis not present

## 2021-08-16 DIAGNOSIS — R21 Rash and other nonspecific skin eruption: Secondary | ICD-10-CM | POA: Diagnosis not present

## 2021-08-16 DIAGNOSIS — R590 Localized enlarged lymph nodes: Secondary | ICD-10-CM | POA: Diagnosis not present

## 2021-08-16 DIAGNOSIS — I110 Hypertensive heart disease with heart failure: Secondary | ICD-10-CM | POA: Diagnosis not present

## 2021-08-16 MED ORDER — DIPHENHYDRAMINE HCL 25 MG PO CAPS
25.0000 mg | ORAL_CAPSULE | Freq: Once | ORAL | Status: AC
  Administered 2021-08-16: 25 mg via ORAL
  Filled 2021-08-16: qty 1

## 2021-08-16 MED ORDER — SODIUM CHLORIDE 0.9% IV SOLUTION
250.0000 mL | Freq: Once | INTRAVENOUS | Status: AC
  Administered 2021-08-16: 250 mL via INTRAVENOUS

## 2021-08-16 MED ORDER — ACETAMINOPHEN 325 MG PO TABS
650.0000 mg | ORAL_TABLET | Freq: Once | ORAL | Status: AC
  Administered 2021-08-16: 650 mg via ORAL
  Filled 2021-08-16: qty 2

## 2021-08-16 NOTE — Progress Notes (Signed)
[place] [phone]   Fax:(336) [phone]  OFFICE PROGRESS NOTE  Laurie Lima, MD [place] [address]  DIAGNOSIS: Stage IV (Tx, N3, M1C) non-small cell lung cancer, adenocarcinoma. She presented with a left primary lung lesion and widespread metastatic disease in the thoracic nodes, left pleural space, abdomen pelvic lymph nodes, and bones.  She was diagnosed in October 2022.  Molecular studies: The patient has positive EGFR mutation in exon 21 (L858R)   PRIOR THERAPY: None    CURRENT THERAPY:  1) Targeted treatment with Tagrisso 80 mg p.o. daily.  First dose started on June 21, 2021.  Status post 2 months.  2) palliative radiation  INTERVAL HISTORY: [name] 81 y.o. female returns to the clinic today for follow-up visit accompanied by her daughter.  The patient is feeling fine today with no concerning complaints except for the fatigue and the baseline shortness of breath.  Her shortness of breath had significantly improved since starting her treatment with Tagrisso.  She denied having any current chest pain but continues to have mild cough with no hemoptysis.  She denied having any fever or chills.  She has no nausea, vomiting, diarrhea or constipation.  She has a history of AV malformation and she continues to have gastrointestinal blood loss.  She also mentions some vaginal bleeding but she is not interested in seeing a gynecologist.  The patient was found on recent blood work to have significant anemia and she required 2 units of PRBCs transfusion today.  She has been tolerating her treatment with Tagrisso fairly well.  She had repeat CT scan of the chest, abdomen pelvis performed recently and she is here for evaluation and discussion of her scan results.  MEDICAL HISTORY: Past Medical History:  Diagnosis Date   Allergy    Angiodysplasia of cecum    Angiodysplasia of duodenum    Arthritis    Asteatotic eczema  02/25/2016   Asthma    Atrial fibrillation ([place]) 10/17/2017   Blood transfusion without reported diagnosis    Cancer ([facility])    Cataract    CHF (congestive heart failure), NYHA class III, chronic, combined ([facility]) 08/10/2017   Colonic polyp 02/16/2008   Tubular adenoma   COPD (chronic obstructive pulmonary disease) ([facility]) 01/27/1998   Cor pulmonale ([place]) 11/16/2014   GERD 07/30/1992   Gout    HTN (hypertension) 07/30/1988   Hyperlipidemia with target LDL less than 100 11/27/1996        Kidney stone 1960, 1972, 1991   MGUS (monoclonal gammopathy of unknown significance) 11/08/2016   Morbid obesity ([facility] [facility]) 04/19/2010   She agrees to work on her lifestyle modifications to help her lose weight.    OSA (obstructive sleep apnea) 06/22/2013   Osteopenia, senile 02/05/2013   July 2014  -2.1 left femur -1.9 left forearm    Prediabetes 11/13/2006   Renal insufficiency    Stenosis of cervical spine with myelopathy ([facility]) 01/01/2018   Ulcer of leg, chronic, right ([facility]) 10/10/2011   ULCERATIVE COLITIS-LEFT SIDE 03/19/2008        Vitamin B12 deficiency anemia 11/13/2006        Vitamin D deficiency 02/06/2013    ALLERGIES:  is allergic to celebrex [celecoxib], amlodipine besylate, enalapril, lipitor [atorvastatin], amoxicillin, codeine sulfate, and hydrocodone-acetaminophen.  MEDICATIONS:  Current Outpatient Medications  Medication Sig Dispense Refill   acetaminophen (TYLENOL) 325 MG tablet Take 325-650 mg by mouth daily as needed for moderate pain, fever  or headache.      albuterol (PROAIR HFA) 108 (90 Base) MCG/ACT inhaler Inhale 1-2 puffs into the lungs every 6 (six) hours as needed for wheezing or shortness of breath. 18 g 11   allopurinol (ZYLOPRIM) 100 MG tablet TAKE 1 TABLET(100 MG) BY MOUTH DAILY 90 tablet 1   clonazePAM (KLONOPIN) 1 MG tablet Take 1 tablet (1 mg total) by mouth 2 (two) times daily as needed for anxiety. 180 tablet 0   ferrous sulfate 325 (65 FE) MG tablet Take 1 tablet  (325 mg total) by mouth 2 (two) times daily with a meal. 180 tablet 1   Fluticasone-Umeclidin-Vilant (TRELEGY ELLIPTA) 100-62.5-25 MCG/INH AEPB Inhale 1 puff into the lungs daily. 120 each 1   isosorbide-hydrALAZINE (BIDIL) 20-37.5 MG tablet TAKE 1 TABLET BY MOUTH THREE TIMES DAILY 270 tablet 0   ketoconazole (NIZORAL) 2 % cream Apply 1 application topically daily. 15 g 0   metoprolol tartrate (LOPRESSOR) 25 MG tablet TAKE 1 TABLET(25 MG) BY MOUTH TWICE DAILY (Patient taking differently: Take 12.5 mg by mouth 2 (two) times daily.) 180 tablet 1   morphine (MS CONTIN) 15 MG 12 hr tablet Take 1 tablet (15 mg total) by mouth 2 (two) times daily as needed for pain. 60 tablet 0   osimertinib mesylate (TAGRISSO) 80 MG tablet Take 1 tablet (80 mg total) by mouth daily. 30 tablet 3   potassium chloride SA (KLOR-CON M) 20 MEQ tablet Take 1 tablet (20 mEq total) by mouth daily. 5 tablet 0   prochlorperazine (COMPAZINE) 10 MG tablet Take 1 tablet (10 mg total) by mouth every 6 (six) hours as needed. 30 tablet 2   torsemide (DEMADEX) 20 MG tablet TAKE 2 TABLETS(40 MG) BY MOUTH EVERY MORNING 180 tablet 0   Current Facility-Administered Medications  Medication Dose Route Frequency Provider Last Rate Last Admin   cyanocobalamin ((VITAMIN B-12)) injection 1,000 mcg  1,000 mcg Intramuscular Q30 days Laurie Lima, MD   1,000 mcg at 01/31/21 1507    SURGICAL HISTORY:  Past Surgical History:  Procedure Laterality Date   BRONCHOSCOPY     CHEST TUBE INSERTION Left 06/15/2021   Procedure: INSERTION PLEURAL DRAINAGE CATHETER;  Surgeon: [name], MD;  Location: [facility];  Service: Thoracic;  Laterality: Left;   COLONOSCOPY     COLONOSCOPY WITH PROPOFOL N/A 02/23/2015   Procedure: COLONOSCOPY WITH PROPOFOL;  Surgeon: Ladene Artist, MD;  Location: [facility];  Service: Endoscopy;  Laterality: N/A;   COLONOSCOPY WITH PROPOFOL N/A 08/12/2018   Procedure: COLONOSCOPY WITH PROPOFOL;  Surgeon: Ladene Artist, MD;  Location: [facility];  Service: Endoscopy;  Laterality: N/A;   COLONOSCOPY WITH PROPOFOL N/A 05/09/2020   Procedure: COLONOSCOPY WITH PROPOFOL;  Surgeon: Ladene Artist, MD;  Location: [facility];  Service: Endoscopy;  Laterality: N/A;  To Splenic Flexture   ESOPHAGOGASTRODUODENOSCOPY (EGD) WITH PROPOFOL N/A 02/23/2015   Procedure: ESOPHAGOGASTRODUODENOSCOPY (EGD) WITH PROPOFOL;  Surgeon: Ladene Artist, MD;  [facility];  Service: Endoscopy;  Laterality: N/A;   ESOPHAGOGASTRODUODENOSCOPY (EGD) WITH PROPOFOL N/A 08/12/2018   Procedure: ESOPHAGOGASTRODUODENOSCOPY (EGD) WITH PROPOFOL;  Surgeon: Ladene Artist, MD;  [facility];  Service: Endoscopy;  Laterality: N/A;   ESOPHAGOGASTRODUODENOSCOPY (EGD) WITH PROPOFOL N/A 05/08/2020   Procedure: ESOPHAGOGASTRODUODENOSCOPY (EGD) WITH PROPOFOL;  Surgeon: Yetta Flock, MD;  [facility];  Service: Gastroenterology;  Laterality: N/A;   HOT HEMOSTASIS N/A 08/12/2018   Procedure: HOT HEMOSTASIS (ARGON PLASMA COAGULATION/BICAP);  Surgeon: Ladene Artist, MD;  [facility];  Service: Endoscopy;  Laterality: N/A;  EGD and Colon APC   HOT HEMOSTASIS N/A 05/08/2020   Procedure: HOT HEMOSTASIS (ARGON PLASMA COAGULATION/BICAP);  Surgeon: Yetta Flock, MD;  Location: [facility];  Service: Gastroenterology;  Laterality: N/A;   LAPAROTOMY N/A 08/11/2020   Procedure: EXPLORATORY LAPAROTOMY PARTIAL COLECTOMY AND COLOSTOMY WITH RESECTION OF A MESSENTERIC MASS;  Surgeon: Coralie Keens, MD;  Location: [facility];  Service: General;  Laterality: N/A;   POLYPECTOMY      REVIEW OF SYSTEMS:  Constitutional: positive for fatigue Eyes: negative Ears, nose, mouth, throat, and face: negative Respiratory: positive for cough and dyspnea on exertion Cardiovascular: negative Gastrointestinal: negative Genitourinary:negative Integument/breast: negative Hematologic/lymphatic:  negative Musculoskeletal:negative Neurological: negative Behavioral/Psych: negative Endocrine: negative Allergic/Immunologic: negative   PHYSICAL EXAMINATION: General appearance: alert, cooperative, fatigued, and no distress Head: Normocephalic, without obvious abnormality, atraumatic Neck: no adenopathy, no JVD, supple, symmetrical, trachea midline, and thyroid not enlarged, symmetric, no tenderness/mass/nodules Lymph nodes: Cervical, supraclavicular, and axillary nodes normal. Resp: clear to auscultation bilaterally Back: symmetric, no curvature. ROM normal. No CVA tenderness. Cardio: regular rate and rhythm, S1, S2 normal, no murmur, click, rub or gallop GI: soft, non-tender; bowel sounds normal; no masses,  no organomegaly Extremities: extremities normal, atraumatic, no cyanosis or edema Neurologic: Alert and oriented X 3, normal strength and tone. Normal symmetric reflexes. Normal coordination and gait  ECOG PERFORMANCE STATUS: 1 - Symptomatic but completely ambulatory  Weight 165 lb (74.8 kg).  LABORATORY DATA: Lab Results  Component Value Date   WBC 7.8 08/14/2021   HGB 6.8 (LL) 08/14/2021   HCT 22.9 (L) 08/14/2021   MCV 89.5 08/14/2021   PLT 164 08/14/2021      Chemistry      Component Value Date/Time   NA 138 08/14/2021 1500   NA 142 04/11/2016 1007   K 4.0 08/14/2021 1500   K 3.3 (L) 04/11/2016 1007   CL 103 08/14/2021 1500   CO2 28 08/14/2021 1500   CO2 31 (H) 04/11/2016 1007   BUN 63 (H) 08/14/2021 1500   BUN 24.4 04/11/2016 1007   CREATININE 1.67 (H) 08/14/2021 1500   CREATININE 1.36 (H) 02/04/2020 1443   CREATININE 1.1 04/11/2016 1007      Component Value Date/Time   CALCIUM 9.7 08/14/2021 1500   CALCIUM 9.8 04/11/2016 1007   ALKPHOS 39 08/14/2021 1500   ALKPHOS 82 04/11/2016 1007   AST 9 (L) 08/14/2021 1500   AST 12 04/11/2016 1007   ALT <5 08/14/2021 1500   ALT 12 04/11/2016 1007   BILITOT 0.2 (L) 08/14/2021 1500   BILITOT 0.35 04/11/2016 1007        RADIOGRAPHIC STUDIES: CT Chest W Contrast  Result Date: 08/14/2021 CLINICAL DATA:  Non-small cell lung cancer staging. Chemotherapy ongoing. Patient underwent partial transverse colon resection with resection of a mesenteric mass on 08/15/2020. EXAM: CT CHEST, ABDOMEN, AND PELVIS WITH CONTRAST TECHNIQUE: Multidetector CT imaging of the chest, abdomen and pelvis was performed following the standard protocol during bolus administration of intravenous contrast. RADIATION DOSE REDUCTION: This exam was performed according to the departmental dose-optimization program which includes automated exposure control, adjustment of the mA and/or kV according to patient size and/or use of iterative reconstruction technique. CONTRAST:  59m OMNIPAQUE IOHEXOL 300 MG/ML  SOLN COMPARISON:  PET-CT 06/07/2021.  Abdominopelvic CT 08/09/2020. FINDINGS: CT CHEST FINDINGS Cardiovascular: There is diffuse atherosclerosis of the aorta, great vessels and coronary arteries. No acute vascular findings are identified. The heart is mildly enlarged.  There is no significant pericardial fluid. Mediastinum/Nodes: The previously demonstrated extensive hypermetabolic mediastinal and hilar adenopathy has substantially improved compared with the prior PET-CT. There is a residual subcarinal node measuring 1.3 cm on image 26/2. No axillary adenopathy identified. The thyroid gland, trachea and esophagus appear stable without significant findings. Lungs/Pleura: Previously demonstrated loculated left pleural effusion has decreased in volume compared with the prior PET-CT, and no residual pleural nodularity identified. There is a pleural drain in place. There is improved left lung aeration. Residual atelectasis is present in the left lower lobe. No suspicious nodules are seen within the aerated portions of the lungs. There is underlying mild to moderate centrilobular emphysema. Musculoskeletal/Chest wall: No chest wall mass or suspicious osseous  findings. CT ABDOMEN AND PELVIS FINDINGS Hepatobiliary: The liver is normal in density without suspicious focal abnormality. No evidence of gallstones, gallbladder wall thickening or biliary dilatation. Pancreas: Unremarkable. No pancreatic ductal dilatation or surrounding inflammatory changes. Spleen: 10 mm low-density lesion inferiorly in the spleen on image 59/2 is unchanged from previous CT and does not appear to correspond with the area of hypermetabolism on PET-CT. The spleen otherwise appears unremarkable. Adrenals/Urinary Tract: Both adrenal glands appear normal. Again demonstrated is a heterogeneous mass arising from the posterior interpolar region of the right kidney, measuring 4.1 x 3.3 cm on image 60/2, similar in size to previous CT. This remains concerning for renal neoplasm (likely renal cell carcinoma). Multiple other renal lesions are present with uniform low and intermediate density, likely complex cysts. No evidence of urinary tract calculus or hydronephrosis. The bladder appears unremarkable. Stomach/Bowel: Enteric contrast was administered and has passed into the distal small bowel. There is prominent ingested material within the stomach. No evidence of bowel wall thickening, distention or surrounding inflammation. There are 2 large ventral hernias containing portions of the colon, appendix and distal small bowel. There is a probable diverting colostomy of the transverse colon. Prominent stool is present throughout the colon. Difficult to exclude an intraluminal mass within the cecum, measuring 2.8 x 2.6 cm on image 82/2, although this was not clearly seen on prior PET-CT. The hypermetabolic nodule along the posterior aspect of hernia on PET-CT is no longer seen and apparently was resected on 08/11/2020. Vascular/Lymphatic: There are no enlarged abdominal or pelvic lymph nodes. Stable small retroperitoneal lymph nodes. Diffuse aortic and branch vessel atherosclerosis. Reproductive: The uterus  and ovaries appear unremarkable. No adnexal mass. Other: As above, large ventral hernias associated with the patient's transverse colostomy. Musculoskeletal: No acute or significant osseous findings. Multilevel spondylosis. Chronic superior endplate compression deformities at L3 and L4. IMPRESSION: 1. Since PET-CT of 2 months ago, the left pleural effusion has decreased in volume, and there is improved aeration of the left lung and improved mediastinal and hilar adenopathy, consistent with response to therapy. 2. Interval postsurgical changes related to transverse colostomy and mesenteric nodule resection. An intraluminal cecal lesion is difficult to exclude on this examination, although was not seen on prior PET-CT. Recommend attention on follow-up. 3. Persistent heterogeneously enhancing right renal mass which remains suspicious for renal cell carcinoma. This is not significantly changed from prior CT of 1 year ago, although has enlarged from 2017. There are additional simple and mildly complex renal cysts bilaterally which are incompletely characterized by this examination. If it would change the patient's current management, the renal lesions could be further evaluated by dedicated renal MRI (without and with contrast). 4. No typical metastatic disease identified in the abdomen or pelvis. 5. Persistent large ventral  hernias containing distal small bowel, appendix and colon. 6. Aortic Atherosclerosis (ICD10-I70.0) and Emphysema (ICD10-J43.9). Electronically Signed   By: Richardean Sale M.D.   On: 08/14/2021 17:14   CT Abdomen Pelvis W Contrast  Result Date: 08/14/2021 CLINICAL DATA:  Non-small cell lung cancer staging. Chemotherapy ongoing. Patient underwent partial transverse colon resection with resection of a mesenteric mass on 08/15/2020. EXAM: CT CHEST, ABDOMEN, AND PELVIS WITH CONTRAST TECHNIQUE: Multidetector CT imaging of the chest, abdomen and pelvis was performed following the standard protocol  during bolus administration of intravenous contrast. RADIATION DOSE REDUCTION: This exam was performed according to the departmental dose-optimization program which includes automated exposure control, adjustment of the mA and/or kV according to patient size and/or use of iterative reconstruction technique. CONTRAST:  67m OMNIPAQUE IOHEXOL 300 MG/ML  SOLN COMPARISON:  PET-CT 06/07/2021.  Abdominopelvic CT 08/09/2020. FINDINGS: CT CHEST FINDINGS Cardiovascular: There is diffuse atherosclerosis of the aorta, great vessels and coronary arteries. No acute vascular findings are identified. The heart is mildly enlarged. There is no significant pericardial fluid. Mediastinum/Nodes: The previously demonstrated extensive hypermetabolic mediastinal and hilar adenopathy has substantially improved compared with the prior PET-CT. There is a residual subcarinal node measuring 1.3 cm on image 26/2. No axillary adenopathy identified. The thyroid gland, trachea and esophagus appear stable without significant findings. Lungs/Pleura: Previously demonstrated loculated left pleural effusion has decreased in volume compared with the prior PET-CT, and no residual pleural nodularity identified. There is a pleural drain in place. There is improved left lung aeration. Residual atelectasis is present in the left lower lobe. No suspicious nodules are seen within the aerated portions of the lungs. There is underlying mild to moderate centrilobular emphysema. Musculoskeletal/Chest wall: No chest wall mass or suspicious osseous findings. CT ABDOMEN AND PELVIS FINDINGS Hepatobiliary: The liver is normal in density without suspicious focal abnormality. No evidence of gallstones, gallbladder wall thickening or biliary dilatation. Pancreas: Unremarkable. No pancreatic ductal dilatation or surrounding inflammatory changes. Spleen: 10 mm low-density lesion inferiorly in the spleen on image 59/2 is unchanged from previous CT and does not appear to  correspond with the area of hypermetabolism on PET-CT. The spleen otherwise appears unremarkable. Adrenals/Urinary Tract: Both adrenal glands appear normal. Again demonstrated is a heterogeneous mass arising from the posterior interpolar region of the right kidney, measuring 4.1 x 3.3 cm on image 60/2, similar in size to previous CT. This remains concerning for renal neoplasm (likely renal cell carcinoma). Multiple other renal lesions are present with uniform low and intermediate density, likely complex cysts. No evidence of urinary tract calculus or hydronephrosis. The bladder appears unremarkable. Stomach/Bowel: Enteric contrast was administered and has passed into the distal small bowel. There is prominent ingested material within the stomach. No evidence of bowel wall thickening, distention or surrounding inflammation. There are 2 large ventral hernias containing portions of the colon, appendix and distal small bowel. There is a probable diverting colostomy of the transverse colon. Prominent stool is present throughout the colon. Difficult to exclude an intraluminal mass within the cecum, measuring 2.8 x 2.6 cm on image 82/2, although this was not clearly seen on prior PET-CT. The hypermetabolic nodule along the posterior aspect of hernia on PET-CT is no longer seen and apparently was resected on 08/11/2020. Vascular/Lymphatic: There are no enlarged abdominal or pelvic lymph nodes. Stable small retroperitoneal lymph nodes. Diffuse aortic and branch vessel atherosclerosis. Reproductive: The uterus and ovaries appear unremarkable. No adnexal mass. Other: As above, large ventral hernias associated with the patient's transverse colostomy. Musculoskeletal:  No acute or significant osseous findings. Multilevel spondylosis. Chronic superior endplate compression deformities at L3 and L4. IMPRESSION: 1. Since PET-CT of 2 months ago, the left pleural effusion has decreased in volume, and there is improved aeration of the  left lung and improved mediastinal and hilar adenopathy, consistent with response to therapy. 2. Interval postsurgical changes related to transverse colostomy and mesenteric nodule resection. An intraluminal cecal lesion is difficult to exclude on this examination, although was not seen on prior PET-CT. Recommend attention on follow-up. 3. Persistent heterogeneously enhancing right renal mass which remains suspicious for renal cell carcinoma. This is not significantly changed from prior CT of 1 year ago, although has enlarged from 2017. There are additional simple and mildly complex renal cysts bilaterally which are incompletely characterized by this examination. If it would change the patient's current management, the renal lesions could be further evaluated by dedicated renal MRI (without and with contrast). 4. No typical metastatic disease identified in the abdomen or pelvis. 5. Persistent large ventral hernias containing distal small bowel, appendix and colon. 6. Aortic Atherosclerosis (ICD10-I70.0) and Emphysema (ICD10-J43.9). Electronically Signed   By: Richardean Sale M.D.   On: 08/14/2021 17:14    ASSESSMENT AND PLAN: This is a very pleasant 81 years old with a stage IV (TX, N3, M1 C) non-small cell lung cancer, adenocarcinoma with positive EGFR mutation exon 21 (L858R) diagnosed in October 2022 1 presented with left primary lung lesion in addition to widespread metastatic disease in the thoracic nodes, left pleural space as well as abdominal pelvic lymph node and bone metastasis. The patient is currently undergoing treatment with Tagrisso 80 mg p.o. daily started June 21, 2021.  She is status post 2 months of treatment and has been tolerating her treatment with Tagrisso fairly well except for the fatigue and skin rash under the breast and in the groin area. She had repeat CT scan of the chest, abdomen pelvis performed recently.  I personally and independently reviewed the scan images and discussed  the result and showed the images to the patient and her daughter. Her scan showed significant improvement of her disease with almost close resolution of the left pleural effusion and improvement in the mediastinal lymphadenopathy. I recommended for the patient to continue her current treatment with Tagrisso with the same dose. For the anemia, she will continue on oral iron tablet and the patient received 2 units of PRBCs transfusion. For the skin rash she will continue to apply ketoconazole cream to the skin rash area. The patient will come back for follow-up visit in 1 months for evaluation and repeat blood work. She was advised to call immediately if she has any other concerning symptoms in the interval.  The patient voices understanding of current disease status and treatment options and is in agreement with the current care plan.  All questions were answered. The patient knows to call the clinic with any problems, questions or concerns. We can certainly see the patient much sooner if necessary.   Disclaimer: This note was dictated with voice recognition software. Similar sounding words can inadvertently be transcribed and may not be corrected upon review.

## 2021-08-16 NOTE — Patient Instructions (Signed)
Blood Transfusion, Adult, Care After This sheet gives you information about how to care for yourself after your procedure. Your doctor may also give you more specific instructions. If you have problems or questions, contact your doctor. What can I expect after the procedure? After the procedure, it is common to have: Bruising and soreness at the IV site. A headache. Follow these instructions at home: Insertion site care   Follow instructions from your doctor about how to take care of your insertion site. This is where an IV tube was put into your vein. Make sure you: Wash your hands with soap and water before and after you change your bandage (dressing). If you cannot use soap and water, use hand sanitizer. Change your bandage as told by your doctor. Check your insertion site every day for signs of infection. Check for: Redness, swelling, or pain. Bleeding from the site. Warmth. Pus or a bad smell. General instructions Take over-the-counter and prescription medicines only as told by your doctor. Rest as told by your doctor. Go back to your normal activities as told by your doctor. Keep all follow-up visits as told by your doctor. This is important. Contact a doctor if: You have itching or red, swollen areas of skin (hives). You feel worried or nervous (anxious). You feel weak after doing your normal activities. You have redness, swelling, warmth, or pain around the insertion site. You have blood coming from the insertion site, and the blood does not stop with pressure. You have pus or a bad smell coming from the insertion site. Get help right away if: You have signs of a serious reaction. This may be coming from an allergy or the body's defense system (immune system). Signs include: Trouble breathing or shortness of breath. Swelling of the face or feeling warm (flushed). Fever or chills. Head, chest, or back pain. Dark pee (urine) or blood in the pee. Widespread rash. Fast  heartbeat. Feeling dizzy or light-headed. You may receive your blood transfusion in an outpatient setting. If so, you will be told whom to contact to report any reactions. These symptoms may be an emergency. Do not wait to see if the symptoms will go away. Get medical help right away. Call your local emergency services (911 in the [place] Do not drive yourself to the hospital. Summary Bruising and soreness at the IV site are common. Check your insertion site every day for signs of infection. Rest as told by your doctor. Go back to your normal activities as told by your doctor. Get help right away if you have signs of a serious reaction. This information is not intended to replace advice given to you by your health care provider. Make sure you discuss any questions you have with your health care provider. Document Revised: 11/10/2020 Document Reviewed: 01/08/2019 [employer] Patient Education  2022 [employer]  

## 2021-08-17 ENCOUNTER — Telehealth: Payer: Self-pay | Admitting: Internal Medicine

## 2021-08-17 DIAGNOSIS — Z933 Colostomy status: Secondary | ICD-10-CM | POA: Diagnosis not present

## 2021-08-17 DIAGNOSIS — N1832 Chronic kidney disease, stage 3b: Secondary | ICD-10-CM | POA: Diagnosis not present

## 2021-08-17 DIAGNOSIS — M4802 Spinal stenosis, cervical region: Secondary | ICD-10-CM | POA: Diagnosis not present

## 2021-08-17 DIAGNOSIS — M19071 Primary osteoarthritis, right ankle and foot: Secondary | ICD-10-CM | POA: Diagnosis not present

## 2021-08-17 DIAGNOSIS — Z48813 Encounter for surgical aftercare following surgery on the respiratory system: Secondary | ICD-10-CM | POA: Diagnosis not present

## 2021-08-17 DIAGNOSIS — M85832 Other specified disorders of bone density and structure, left forearm: Secondary | ICD-10-CM | POA: Diagnosis not present

## 2021-08-17 DIAGNOSIS — E785 Hyperlipidemia, unspecified: Secondary | ICD-10-CM | POA: Diagnosis not present

## 2021-08-17 DIAGNOSIS — J9621 Acute and chronic respiratory failure with hypoxia: Secondary | ICD-10-CM | POA: Diagnosis not present

## 2021-08-17 DIAGNOSIS — G4733 Obstructive sleep apnea (adult) (pediatric): Secondary | ICD-10-CM | POA: Diagnosis not present

## 2021-08-17 DIAGNOSIS — D630 Anemia in neoplastic disease: Secondary | ICD-10-CM | POA: Diagnosis not present

## 2021-08-17 DIAGNOSIS — I130 Hypertensive heart and chronic kidney disease with heart failure and stage 1 through stage 4 chronic kidney disease, or unspecified chronic kidney disease: Secondary | ICD-10-CM | POA: Diagnosis not present

## 2021-08-17 DIAGNOSIS — I2781 Cor pulmonale (chronic): Secondary | ICD-10-CM | POA: Diagnosis not present

## 2021-08-17 DIAGNOSIS — D631 Anemia in chronic kidney disease: Secondary | ICD-10-CM | POA: Diagnosis not present

## 2021-08-17 DIAGNOSIS — M479 Spondylosis, unspecified: Secondary | ICD-10-CM | POA: Diagnosis not present

## 2021-08-17 DIAGNOSIS — Z4801 Encounter for change or removal of surgical wound dressing: Secondary | ICD-10-CM | POA: Diagnosis not present

## 2021-08-17 DIAGNOSIS — I700 Atherosclerosis of aorta: Secondary | ICD-10-CM | POA: Diagnosis not present

## 2021-08-17 DIAGNOSIS — D519 Vitamin B12 deficiency anemia, unspecified: Secondary | ICD-10-CM | POA: Diagnosis not present

## 2021-08-17 DIAGNOSIS — H539 Unspecified visual disturbance: Secondary | ICD-10-CM | POA: Diagnosis not present

## 2021-08-17 DIAGNOSIS — M1030 Gout due to renal impairment, unspecified site: Secondary | ICD-10-CM | POA: Diagnosis not present

## 2021-08-17 DIAGNOSIS — I878 Other specified disorders of veins: Secondary | ICD-10-CM | POA: Diagnosis not present

## 2021-08-17 DIAGNOSIS — M810 Age-related osteoporosis without current pathological fracture: Secondary | ICD-10-CM | POA: Diagnosis not present

## 2021-08-17 DIAGNOSIS — D472 Monoclonal gammopathy: Secondary | ICD-10-CM | POA: Diagnosis not present

## 2021-08-17 DIAGNOSIS — C3490 Malignant neoplasm of unspecified part of unspecified bronchus or lung: Secondary | ICD-10-CM | POA: Diagnosis not present

## 2021-08-17 DIAGNOSIS — J449 Chronic obstructive pulmonary disease, unspecified: Secondary | ICD-10-CM | POA: Diagnosis not present

## 2021-08-17 DIAGNOSIS — M19072 Primary osteoarthritis, left ankle and foot: Secondary | ICD-10-CM | POA: Diagnosis not present

## 2021-08-17 DIAGNOSIS — I5042 Chronic combined systolic (congestive) and diastolic (congestive) heart failure: Secondary | ICD-10-CM | POA: Diagnosis not present

## 2021-08-17 LAB — TYPE AND SCREEN
ABO/RH(D): O POS
Antibody Screen: POSITIVE
Donor AG Type: NEGATIVE
Donor AG Type: NEGATIVE
Unit division: 0
Unit division: 0

## 2021-08-17 LAB — BPAM RBC
Blood Product Expiration Date: 202302142359
Blood Product Expiration Date: 202302142359
ISSUE DATE / TIME: 202301171420
ISSUE DATE / TIME: 202301181313
Unit Type and Rh: 5100
Unit Type and Rh: 5100

## 2021-08-17 NOTE — Telephone Encounter (Signed)
I would not feel comfortable with this as is outside the scope of my practice  Will forward to PCP and oncology

## 2021-08-17 NOTE — Telephone Encounter (Signed)
Mariann Laster w/ [facility] checking status of faxed order for a florex drain for left side   Rep states fax was sent 06-28-2021

## 2021-08-21 ENCOUNTER — Other Ambulatory Visit: Payer: Self-pay | Admitting: Internal Medicine

## 2021-08-21 DIAGNOSIS — I5042 Chronic combined systolic (congestive) and diastolic (congestive) heart failure: Secondary | ICD-10-CM

## 2021-08-21 DIAGNOSIS — I10 Essential (primary) hypertension: Secondary | ICD-10-CM

## 2021-08-23 DIAGNOSIS — M19072 Primary osteoarthritis, left ankle and foot: Secondary | ICD-10-CM | POA: Diagnosis not present

## 2021-08-23 DIAGNOSIS — J9621 Acute and chronic respiratory failure with hypoxia: Secondary | ICD-10-CM | POA: Diagnosis not present

## 2021-08-23 DIAGNOSIS — D631 Anemia in chronic kidney disease: Secondary | ICD-10-CM | POA: Diagnosis not present

## 2021-08-23 DIAGNOSIS — H539 Unspecified visual disturbance: Secondary | ICD-10-CM | POA: Diagnosis not present

## 2021-08-23 DIAGNOSIS — Z48813 Encounter for surgical aftercare following surgery on the respiratory system: Secondary | ICD-10-CM | POA: Diagnosis not present

## 2021-08-23 DIAGNOSIS — I5042 Chronic combined systolic (congestive) and diastolic (congestive) heart failure: Secondary | ICD-10-CM | POA: Diagnosis not present

## 2021-08-23 DIAGNOSIS — I700 Atherosclerosis of aorta: Secondary | ICD-10-CM | POA: Diagnosis not present

## 2021-08-23 DIAGNOSIS — M4802 Spinal stenosis, cervical region: Secondary | ICD-10-CM | POA: Diagnosis not present

## 2021-08-23 DIAGNOSIS — N1832 Chronic kidney disease, stage 3b: Secondary | ICD-10-CM | POA: Diagnosis not present

## 2021-08-23 DIAGNOSIS — D472 Monoclonal gammopathy: Secondary | ICD-10-CM | POA: Diagnosis not present

## 2021-08-23 DIAGNOSIS — E785 Hyperlipidemia, unspecified: Secondary | ICD-10-CM | POA: Diagnosis not present

## 2021-08-23 DIAGNOSIS — I2781 Cor pulmonale (chronic): Secondary | ICD-10-CM | POA: Diagnosis not present

## 2021-08-23 DIAGNOSIS — G4733 Obstructive sleep apnea (adult) (pediatric): Secondary | ICD-10-CM | POA: Diagnosis not present

## 2021-08-23 DIAGNOSIS — C3490 Malignant neoplasm of unspecified part of unspecified bronchus or lung: Secondary | ICD-10-CM | POA: Diagnosis not present

## 2021-08-23 DIAGNOSIS — J449 Chronic obstructive pulmonary disease, unspecified: Secondary | ICD-10-CM | POA: Diagnosis not present

## 2021-08-23 DIAGNOSIS — M1030 Gout due to renal impairment, unspecified site: Secondary | ICD-10-CM | POA: Diagnosis not present

## 2021-08-23 DIAGNOSIS — D630 Anemia in neoplastic disease: Secondary | ICD-10-CM | POA: Diagnosis not present

## 2021-08-23 DIAGNOSIS — I130 Hypertensive heart and chronic kidney disease with heart failure and stage 1 through stage 4 chronic kidney disease, or unspecified chronic kidney disease: Secondary | ICD-10-CM | POA: Diagnosis not present

## 2021-08-23 DIAGNOSIS — M19071 Primary osteoarthritis, right ankle and foot: Secondary | ICD-10-CM | POA: Diagnosis not present

## 2021-08-23 DIAGNOSIS — D519 Vitamin B12 deficiency anemia, unspecified: Secondary | ICD-10-CM | POA: Diagnosis not present

## 2021-08-23 DIAGNOSIS — I878 Other specified disorders of veins: Secondary | ICD-10-CM | POA: Diagnosis not present

## 2021-08-23 DIAGNOSIS — Z4801 Encounter for change or removal of surgical wound dressing: Secondary | ICD-10-CM | POA: Diagnosis not present

## 2021-08-23 DIAGNOSIS — M85832 Other specified disorders of bone density and structure, left forearm: Secondary | ICD-10-CM | POA: Diagnosis not present

## 2021-08-23 DIAGNOSIS — M479 Spondylosis, unspecified: Secondary | ICD-10-CM | POA: Diagnosis not present

## 2021-08-23 DIAGNOSIS — M810 Age-related osteoporosis without current pathological fracture: Secondary | ICD-10-CM | POA: Diagnosis not present

## 2021-08-30 DIAGNOSIS — D519 Vitamin B12 deficiency anemia, unspecified: Secondary | ICD-10-CM | POA: Diagnosis not present

## 2021-08-30 DIAGNOSIS — M1030 Gout due to renal impairment, unspecified site: Secondary | ICD-10-CM | POA: Diagnosis not present

## 2021-08-30 DIAGNOSIS — M19071 Primary osteoarthritis, right ankle and foot: Secondary | ICD-10-CM | POA: Diagnosis not present

## 2021-08-30 DIAGNOSIS — I5042 Chronic combined systolic (congestive) and diastolic (congestive) heart failure: Secondary | ICD-10-CM | POA: Diagnosis not present

## 2021-08-30 DIAGNOSIS — H539 Unspecified visual disturbance: Secondary | ICD-10-CM | POA: Diagnosis not present

## 2021-08-30 DIAGNOSIS — G4733 Obstructive sleep apnea (adult) (pediatric): Secondary | ICD-10-CM | POA: Diagnosis not present

## 2021-08-30 DIAGNOSIS — Z4801 Encounter for change or removal of surgical wound dressing: Secondary | ICD-10-CM | POA: Diagnosis not present

## 2021-08-30 DIAGNOSIS — C3490 Malignant neoplasm of unspecified part of unspecified bronchus or lung: Secondary | ICD-10-CM | POA: Diagnosis not present

## 2021-08-30 DIAGNOSIS — M85832 Other specified disorders of bone density and structure, left forearm: Secondary | ICD-10-CM | POA: Diagnosis not present

## 2021-08-30 DIAGNOSIS — I700 Atherosclerosis of aorta: Secondary | ICD-10-CM | POA: Diagnosis not present

## 2021-08-30 DIAGNOSIS — I130 Hypertensive heart and chronic kidney disease with heart failure and stage 1 through stage 4 chronic kidney disease, or unspecified chronic kidney disease: Secondary | ICD-10-CM | POA: Diagnosis not present

## 2021-08-30 DIAGNOSIS — D472 Monoclonal gammopathy: Secondary | ICD-10-CM | POA: Diagnosis not present

## 2021-08-30 DIAGNOSIS — E785 Hyperlipidemia, unspecified: Secondary | ICD-10-CM | POA: Diagnosis not present

## 2021-08-30 DIAGNOSIS — I2781 Cor pulmonale (chronic): Secondary | ICD-10-CM | POA: Diagnosis not present

## 2021-08-30 DIAGNOSIS — N1832 Chronic kidney disease, stage 3b: Secondary | ICD-10-CM | POA: Diagnosis not present

## 2021-08-30 DIAGNOSIS — J449 Chronic obstructive pulmonary disease, unspecified: Secondary | ICD-10-CM | POA: Diagnosis not present

## 2021-08-30 DIAGNOSIS — M4802 Spinal stenosis, cervical region: Secondary | ICD-10-CM | POA: Diagnosis not present

## 2021-08-30 DIAGNOSIS — J9621 Acute and chronic respiratory failure with hypoxia: Secondary | ICD-10-CM | POA: Diagnosis not present

## 2021-08-30 DIAGNOSIS — I878 Other specified disorders of veins: Secondary | ICD-10-CM | POA: Diagnosis not present

## 2021-08-30 DIAGNOSIS — D630 Anemia in neoplastic disease: Secondary | ICD-10-CM | POA: Diagnosis not present

## 2021-08-30 DIAGNOSIS — M479 Spondylosis, unspecified: Secondary | ICD-10-CM | POA: Diagnosis not present

## 2021-08-30 DIAGNOSIS — D631 Anemia in chronic kidney disease: Secondary | ICD-10-CM | POA: Diagnosis not present

## 2021-08-30 DIAGNOSIS — Z48813 Encounter for surgical aftercare following surgery on the respiratory system: Secondary | ICD-10-CM | POA: Diagnosis not present

## 2021-08-30 DIAGNOSIS — M19072 Primary osteoarthritis, left ankle and foot: Secondary | ICD-10-CM | POA: Diagnosis not present

## 2021-08-30 DIAGNOSIS — M810 Age-related osteoporosis without current pathological fracture: Secondary | ICD-10-CM | POA: Diagnosis not present

## 2021-09-04 ENCOUNTER — Other Ambulatory Visit: Payer: Self-pay | Admitting: *Deleted

## 2021-09-04 DIAGNOSIS — J90 Pleural effusion, not elsewhere classified: Secondary | ICD-10-CM

## 2021-09-04 NOTE — Progress Notes (Signed)
Patient contacted the office stating she had a pleurx catheter placed 11/17 by Dr. Roxan Hockey. Per patient, she has been draining catheter weekly. Since 12/5 she she not gotten any drainage from catheter besides air bubbles. Appt scheduled for patient to be seen in clinic with chest xray. Patient acknowledges receipt.

## 2021-09-05 ENCOUNTER — Other Ambulatory Visit: Payer: Self-pay | Admitting: Internal Medicine

## 2021-09-05 MED ORDER — OSIMERTINIB MESYLATE 80 MG PO TABS: 80.0000 mg | ORAL_TABLET | Freq: Every day | ORAL | 3 refills | Status: DC

## 2021-09-06 ENCOUNTER — Other Ambulatory Visit: Payer: Self-pay

## 2021-09-06 ENCOUNTER — Other Ambulatory Visit: Payer: Medicare Other

## 2021-09-06 DIAGNOSIS — Z515 Encounter for palliative care: Secondary | ICD-10-CM

## 2021-09-06 NOTE — Telephone Encounter (Signed)
[name] checking status of florex drain order  Informed rep of provider's recommendations of going through ct surgery, rep expressed understanding

## 2021-09-07 DIAGNOSIS — E785 Hyperlipidemia, unspecified: Secondary | ICD-10-CM | POA: Diagnosis not present

## 2021-09-07 DIAGNOSIS — D630 Anemia in neoplastic disease: Secondary | ICD-10-CM | POA: Diagnosis not present

## 2021-09-07 DIAGNOSIS — J449 Chronic obstructive pulmonary disease, unspecified: Secondary | ICD-10-CM | POA: Diagnosis not present

## 2021-09-07 DIAGNOSIS — I5042 Chronic combined systolic (congestive) and diastolic (congestive) heart failure: Secondary | ICD-10-CM | POA: Diagnosis not present

## 2021-09-07 DIAGNOSIS — M479 Spondylosis, unspecified: Secondary | ICD-10-CM | POA: Diagnosis not present

## 2021-09-07 DIAGNOSIS — D519 Vitamin B12 deficiency anemia, unspecified: Secondary | ICD-10-CM | POA: Diagnosis not present

## 2021-09-07 DIAGNOSIS — M19071 Primary osteoarthritis, right ankle and foot: Secondary | ICD-10-CM | POA: Diagnosis not present

## 2021-09-07 DIAGNOSIS — M19072 Primary osteoarthritis, left ankle and foot: Secondary | ICD-10-CM | POA: Diagnosis not present

## 2021-09-07 DIAGNOSIS — G4733 Obstructive sleep apnea (adult) (pediatric): Secondary | ICD-10-CM | POA: Diagnosis not present

## 2021-09-07 DIAGNOSIS — I878 Other specified disorders of veins: Secondary | ICD-10-CM | POA: Diagnosis not present

## 2021-09-07 DIAGNOSIS — M810 Age-related osteoporosis without current pathological fracture: Secondary | ICD-10-CM | POA: Diagnosis not present

## 2021-09-07 DIAGNOSIS — I2781 Cor pulmonale (chronic): Secondary | ICD-10-CM | POA: Diagnosis not present

## 2021-09-07 DIAGNOSIS — J9621 Acute and chronic respiratory failure with hypoxia: Secondary | ICD-10-CM | POA: Diagnosis not present

## 2021-09-07 DIAGNOSIS — Z4801 Encounter for change or removal of surgical wound dressing: Secondary | ICD-10-CM | POA: Diagnosis not present

## 2021-09-07 DIAGNOSIS — Z48813 Encounter for surgical aftercare following surgery on the respiratory system: Secondary | ICD-10-CM | POA: Diagnosis not present

## 2021-09-07 DIAGNOSIS — M4802 Spinal stenosis, cervical region: Secondary | ICD-10-CM | POA: Diagnosis not present

## 2021-09-07 DIAGNOSIS — I700 Atherosclerosis of aorta: Secondary | ICD-10-CM | POA: Diagnosis not present

## 2021-09-07 DIAGNOSIS — C3490 Malignant neoplasm of unspecified part of unspecified bronchus or lung: Secondary | ICD-10-CM | POA: Diagnosis not present

## 2021-09-07 DIAGNOSIS — H539 Unspecified visual disturbance: Secondary | ICD-10-CM | POA: Diagnosis not present

## 2021-09-07 DIAGNOSIS — D631 Anemia in chronic kidney disease: Secondary | ICD-10-CM | POA: Diagnosis not present

## 2021-09-07 DIAGNOSIS — I130 Hypertensive heart and chronic kidney disease with heart failure and stage 1 through stage 4 chronic kidney disease, or unspecified chronic kidney disease: Secondary | ICD-10-CM | POA: Diagnosis not present

## 2021-09-07 DIAGNOSIS — M1030 Gout due to renal impairment, unspecified site: Secondary | ICD-10-CM | POA: Diagnosis not present

## 2021-09-07 DIAGNOSIS — M85832 Other specified disorders of bone density and structure, left forearm: Secondary | ICD-10-CM | POA: Diagnosis not present

## 2021-09-07 DIAGNOSIS — D472 Monoclonal gammopathy: Secondary | ICD-10-CM | POA: Diagnosis not present

## 2021-09-07 DIAGNOSIS — N1832 Chronic kidney disease, stage 3b: Secondary | ICD-10-CM | POA: Diagnosis not present

## 2021-09-07 NOTE — Progress Notes (Signed)
PATIENT NAME: [name] DOB: [birth year] MRN: [id number]  PRIMARY CARE PROVIDER: [name], MD  RESPONSIBLE PARTY:  Acct ID - Guarantor Home Phone Work Phone Relationship Acct Type  [id number] ARAMIS, ZOBEL* [phone]  Self P/F     [address] [place], [place], [place] [id number]   COMMUNITY PALLIATIVE CARE RN NOTE    PLAN OF CARE and INTERVENTION:  ADVANCE CARE PLANNING/GOALS OF CARE:  Maximize quality of life and symptom management. Continue treatment for metastatic disease.  DISEASE STATUS: RN Palliative care telephonic encounter completed today with patient. Patient verbally responsive to questions. Denies pain and shortness of breath. Reports that she has increased congestion due to bronchitis. Patient remains at home with her husband and caring for her daughter. She expresses appreciation for her grand daughter who comes daily and helps with tasks. Patient shared that she is going to have her grand daughter take patient's daughter to her PCP to seek placement in a memory care. Active listening and support offered. Patient states she tries to stay positive. Declined in person palliative care visit. Encouraged patient to call with any questions or concerns or if in need of support. Patient expressed understanding.  HISTORY OF PRESENT ILLNESS: This is an 81 year old female with diagnosis of metastatic adenocarcinoma, CHF, renal mass, and chronic respiratory failure with hypoxia. Palliative care has been asked to follow for additional support, care and complex decision making.   CODE STATUS: Full Code PPS: 50% PHYSICAL EXAM: deferred      Cornelius Moras, RN

## 2021-09-08 ENCOUNTER — Other Ambulatory Visit: Payer: Self-pay

## 2021-09-08 ENCOUNTER — Ambulatory Visit (HOSPITAL_COMMUNITY)
Admission: RE | Admit: 2021-09-08 | Discharge: 2021-09-08 | Disposition: A | Discharge status: Home / Self Care | Payer: Medicare Other | Source: Ambulatory Visit | Attending: Physician Assistant | Admitting: Physician Assistant

## 2021-09-08 DIAGNOSIS — J90 Pleural effusion, not elsewhere classified: Secondary | ICD-10-CM | POA: Insufficient documentation

## 2021-09-08 DIAGNOSIS — C799 Secondary malignant neoplasm of unspecified site: Secondary | ICD-10-CM | POA: Diagnosis not present

## 2021-09-08 DIAGNOSIS — C3490 Malignant neoplasm of unspecified part of unspecified bronchus or lung: Secondary | ICD-10-CM | POA: Diagnosis not present

## 2021-09-08 DIAGNOSIS — C3492 Malignant neoplasm of unspecified part of left bronchus or lung: Secondary | ICD-10-CM | POA: Insufficient documentation

## 2021-09-08 MED ORDER — GADOBUTROL 1 MMOL/ML IV SOLN
8.0000 mL | Freq: Once | INTRAVENOUS | Status: AC | PRN
  Administered 2021-09-08: 8 mL via INTRAVENOUS

## 2021-09-12 ENCOUNTER — Inpatient Hospital Stay: Payer: Medicare Other

## 2021-09-12 ENCOUNTER — Other Ambulatory Visit: Payer: Self-pay

## 2021-09-12 ENCOUNTER — Inpatient Hospital Stay: Payer: Medicare Other | Attending: Internal Medicine | Admitting: Internal Medicine

## 2021-09-12 VITALS — BP 147/54 | HR 54 | Temp 97.7°F | Resp 19 | Ht 60.0 in

## 2021-09-12 DIAGNOSIS — I110 Hypertensive heart disease with heart failure: Secondary | ICD-10-CM | POA: Insufficient documentation

## 2021-09-12 DIAGNOSIS — D649 Anemia, unspecified: Secondary | ICD-10-CM | POA: Diagnosis not present

## 2021-09-12 DIAGNOSIS — C3492 Malignant neoplasm of unspecified part of left bronchus or lung: Secondary | ICD-10-CM | POA: Diagnosis not present

## 2021-09-12 DIAGNOSIS — I509 Heart failure, unspecified: Secondary | ICD-10-CM | POA: Diagnosis not present

## 2021-09-12 DIAGNOSIS — R21 Rash and other nonspecific skin eruption: Secondary | ICD-10-CM | POA: Insufficient documentation

## 2021-09-12 DIAGNOSIS — C778 Secondary and unspecified malignant neoplasm of lymph nodes of multiple regions: Secondary | ICD-10-CM | POA: Diagnosis not present

## 2021-09-12 DIAGNOSIS — Z5181 Encounter for therapeutic drug level monitoring: Secondary | ICD-10-CM

## 2021-09-12 DIAGNOSIS — C782 Secondary malignant neoplasm of pleura: Secondary | ICD-10-CM | POA: Insufficient documentation

## 2021-09-12 DIAGNOSIS — C7951 Secondary malignant neoplasm of bone: Secondary | ICD-10-CM | POA: Insufficient documentation

## 2021-09-12 LAB — CMP (CANCER CENTER ONLY)
ALT: 8 U/L (ref 0–44)
AST: 13 U/L — ABNORMAL LOW (ref 15–41)
Albumin: 3.6 g/dL (ref 3.5–5.0)
Alkaline Phosphatase: 42 U/L (ref 38–126)
Anion gap: 7 (ref 5–15)
BUN: 79 mg/dL — ABNORMAL HIGH (ref 8–23)
CO2: 27 mmol/L (ref 22–32)
Calcium: 9.3 mg/dL (ref 8.9–10.3)
Chloride: 99 mmol/L (ref 98–111)
Creatinine: 1.7 mg/dL — ABNORMAL HIGH (ref 0.44–1.00)
GFR, Estimated: 30 mL/min — ABNORMAL LOW (ref >60)
Glucose, Bld: 110 mg/dL — ABNORMAL HIGH (ref 70–99)
Potassium: 4.3 mmol/L (ref 3.5–5.1)
Sodium: 133 mmol/L — ABNORMAL LOW (ref 135–145)
Total Bilirubin: 0.1 mg/dL — ABNORMAL LOW (ref 0.3–1.2)
Total Protein: 7.4 g/dL (ref 6.5–8.1)

## 2021-09-12 LAB — CBC WITH DIFFERENTIAL (CANCER CENTER ONLY)
Abs Immature Granulocytes: 0.02 10*3/uL (ref 0.00–0.07)
Basophils Absolute: 0 10*3/uL (ref 0.0–0.1)
Basophils Relative: 0 %
Eosinophils Absolute: 0.2 10*3/uL (ref 0.0–0.5)
Eosinophils Relative: 3 %
HCT: 27.3 % — ABNORMAL LOW (ref 36.0–46.0)
Hemoglobin: 8.6 g/dL — ABNORMAL LOW (ref 12.0–15.0)
Immature Granulocytes: 0 %
Lymphocytes Relative: 13 %
Lymphs Abs: 1.1 10*3/uL (ref 0.7–4.0)
MCH: 26.8 pg (ref 26.0–34.0)
MCHC: 31.5 g/dL (ref 30.0–36.0)
MCV: 85 fL (ref 80.0–100.0)
Monocytes Absolute: 0.8 10*3/uL (ref 0.1–1.0)
Monocytes Relative: 9 %
Neutro Abs: 6.3 10*3/uL (ref 1.7–7.7)
Neutrophils Relative %: 75 %
Platelet Count: 154 10*3/uL (ref 150–400)
RBC: 3.21 MIL/uL — ABNORMAL LOW (ref 3.87–5.11)
RDW: 13.2 % (ref 11.5–15.5)
WBC Count: 8.4 10*3/uL (ref 4.0–10.5)
nRBC: 0 % (ref 0.0–0.2)

## 2021-09-12 MED ORDER — MICONAZOLE NITRATE 2 % EX CREA: 1.0000 "application " | TOPICAL_CREAM | Freq: Two times a day (BID) | CUTANEOUS | 0 refills | Status: DC

## 2021-09-12 NOTE — Progress Notes (Signed)
[facility] [phone]   [phone]  OFFICE PROGRESS NOTE  [name], MD [place] [address]  DIAGNOSIS: Stage IV (Tx, N3, M1C) non-small cell lung cancer, adenocarcinoma. She presented with a left primary lung lesion and widespread metastatic disease in the thoracic nodes, left pleural space, abdomen pelvic lymph nodes, and bones.  She was diagnosed in October 2022.  Molecular studies: The patient has positive EGFR mutation in exon 21 (L858R)   PRIOR THERAPY: None    CURRENT THERAPY:  1) Targeted treatment with Tagrisso 80 mg p.o. daily.  First dose started on June 21, 2021.  Status post 3 months.  2) palliative radiation  INTERVAL HISTORY: [name] 81 y.o. female returns to the clinic today for follow-up visit accompanied by a family member.  The patient is feeling fine today with no concerning complaints except for the baseline fatigue and shortness of breath with exertion.  She has less drainage of the pleural effusion.  She is followed by Dr. Roxan Hockey.  She denied having any chest pain or hemoptysis.  She denied having any nausea, vomiting, diarrhea or constipation.  She has no headache or visual changes.  She denied having any recent weight loss or night sweats.  She continues to tolerate her treatment with Tagrisso fairly well.  The patient had repeat MRI of the brain recently and she is here for evaluation and discussion of her MRI results.  She also received 2 units of PRBCs transfusion few weeks ago for the persistent anemia.  She takes oral iron tablet twice daily.  MEDICAL HISTORY: Past Medical History:  Diagnosis Date   Allergy    Angiodysplasia of cecum    Angiodysplasia of duodenum    Arthritis    Asteatotic eczema 02/25/2016   Asthma    Atrial fibrillation ([facility]) 10/17/2017   Blood transfusion without reported diagnosis    Cancer ([facility])    Cataract    CHF (congestive heart failure),  NYHA class III, chronic, combined ([facility]) 08/10/2017   Colonic polyp 02/16/2008   Tubular adenoma   COPD (chronic obstructive pulmonary disease) ([facility]) 01/27/1998   Cor pulmonale ([facility]) 11/16/2014   GERD 07/30/1992   Gout    HTN (hypertension) 07/30/1988   Hyperlipidemia with target LDL less than 100 11/27/1996        Kidney stone 1960, 1972, 1991   MGUS (monoclonal gammopathy of unknown significance) 11/08/2016   Morbid obesity ([facility]) 04/19/2010   She agrees to work on her lifestyle modifications to help her lose weight.    OSA (obstructive sleep apnea) 06/22/2013   Osteopenia, senile 02/05/2013   July 2014  -2.1 left femur -1.9 left forearm    Prediabetes 11/13/2006   Renal insufficiency    Stenosis of cervical spine with myelopathy ([facility]) 01/01/2018   Ulcer of leg, chronic, right ([facility]) 10/10/2011   ULCERATIVE COLITIS-LEFT SIDE 03/19/2008        Vitamin B12 deficiency anemia 11/13/2006        Vitamin D deficiency 02/06/2013    ALLERGIES:  is allergic to celebrex [celecoxib], amlodipine besylate, enalapril, lipitor [atorvastatin], amoxicillin, codeine sulfate, and hydrocodone-acetaminophen.  MEDICATIONS:  Current Outpatient Medications  Medication Sig Dispense Refill   acetaminophen (TYLENOL) 325 MG tablet Take 325-650 mg by mouth daily as needed for moderate pain, fever or headache.      albuterol (PROAIR HFA) 108 (90 Base) MCG/ACT inhaler Inhale 1-2 puffs into the lungs every 6 (six) hours  as needed for wheezing or shortness of breath. 18 g 11   allopurinol (ZYLOPRIM) 100 MG tablet TAKE 1 TABLET(100 MG) BY MOUTH DAILY 90 tablet 1   clonazePAM (KLONOPIN) 1 MG tablet Take 1 tablet (1 mg total) by mouth 2 (two) times daily as needed for anxiety. 180 tablet 0   ferrous sulfate 325 (65 FE) MG tablet Take 1 tablet (325 mg total) by mouth 2 (two) times daily with a meal. 180 tablet 1   Fluticasone-Umeclidin-Vilant (TRELEGY ELLIPTA) 100-62.5-25 MCG/INH AEPB Inhale 1 puff into the lungs  daily. 120 each 1   isosorbide-hydrALAZINE (BIDIL) 20-37.5 MG tablet TAKE 1 TABLET BY MOUTH THREE TIMES DAILY 270 tablet 0   ketoconazole (NIZORAL) 2 % cream Apply 1 application topically daily. 15 g 0   metoprolol tartrate (LOPRESSOR) 25 MG tablet TAKE 1 TABLET(25 MG) BY MOUTH TWICE DAILY (Patient taking differently: Take 12.5 mg by mouth 2 (two) times daily.) 180 tablet 1   morphine (MS CONTIN) 15 MG 12 hr tablet Take 1 tablet (15 mg total) by mouth 2 (two) times daily as needed for pain. 60 tablet 0   osimertinib mesylate (TAGRISSO) 80 MG tablet Take 1 tablet (80 mg total) by mouth daily. 30 tablet 3   potassium chloride SA (KLOR-CON M) 20 MEQ tablet Take 1 tablet (20 mEq total) by mouth daily. 5 tablet 0   prochlorperazine (COMPAZINE) 10 MG tablet Take 1 tablet (10 mg total) by mouth every 6 (six) hours as needed. 30 tablet 2   torsemide (DEMADEX) 20 MG tablet TAKE 2 TABLETS(40 MG) BY MOUTH EVERY MORNING 180 tablet 0   Current Facility-Administered Medications  Medication Dose Route Frequency Provider Last Rate Last Admin   cyanocobalamin ((VITAMIN B-12)) injection 1,000 mcg  1,000 mcg Intramuscular Q30 days [name], MD   1,000 mcg at 01/31/21 1507    SURGICAL HISTORY:  Past Surgical History:  Procedure Laterality Date   BRONCHOSCOPY     CHEST TUBE INSERTION Left 06/15/2021   Procedure: INSERTION PLEURAL DRAINAGE CATHETER;  Surgeon: [name], MD;  Location: [facility];  Service: Thoracic;  Laterality: Left;   COLONOSCOPY     COLONOSCOPY WITH PROPOFOL N/A 02/23/2015   Procedure: COLONOSCOPY WITH PROPOFOL;  Surgeon: Ladene Artist, MD;  Location: [facility];  Service: Endoscopy;  Laterality: N/A;   COLONOSCOPY WITH PROPOFOL N/A 08/12/2018   Procedure: COLONOSCOPY WITH PROPOFOL;  Surgeon: Ladene Artist, MD;  Location: [facility];  Service: Endoscopy;  Laterality: N/A;   COLONOSCOPY WITH PROPOFOL N/A 05/09/2020   Procedure: COLONOSCOPY WITH PROPOFOL;  Surgeon: Ladene Artist, MD;  Location: [facility];  Service: Endoscopy;  Laterality: N/A;  To Splenic Flexture   ESOPHAGOGASTRODUODENOSCOPY (EGD) WITH PROPOFOL N/A 02/23/2015   Procedure: ESOPHAGOGASTRODUODENOSCOPY (EGD) WITH PROPOFOL;  Surgeon: Ladene Artist, MD;  Location: [facility];  Service: Endoscopy;  Laterality: N/A;   ESOPHAGOGASTRODUODENOSCOPY (EGD) WITH PROPOFOL N/A 08/12/2018   Procedure: ESOPHAGOGASTRODUODENOSCOPY (EGD) WITH PROPOFOL;  Surgeon: Ladene Artist, MD;  [facility]: [facility];  Service: Endoscopy;  Laterality: N/A;   ESOPHAGOGASTRODUODENOSCOPY (EGD) WITH PROPOFOL N/A 05/08/2020   Procedure: ESOPHAGOGASTRODUODENOSCOPY (EGD) WITH PROPOFOL;  Surgeon: Yetta Flock, MD;  Location: [facility];  Service: Gastroenterology;  Laterality: N/A;   HOT HEMOSTASIS N/A 08/12/2018   Procedure: HOT HEMOSTASIS (ARGON PLASMA COAGULATION/BICAP);  Surgeon: Ladene Artist, MD;  Location: [facility];  Service: Endoscopy;  Laterality: N/A;  EGD and Colon APC   HOT HEMOSTASIS N/A 05/08/2020   Procedure: HOT  HEMOSTASIS (ARGON PLASMA COAGULATION/BICAP);  Surgeon: Yetta Flock, MD;  Location: [facility];  Service: Gastroenterology;  Laterality: N/A;   LAPAROTOMY N/A 08/11/2020   Procedure: EXPLORATORY LAPAROTOMY PARTIAL COLECTOMY AND COLOSTOMY WITH RESECTION OF A MESSENTERIC MASS;  Surgeon: Coralie Keens, MD;  Location: [facility];  Service: General;  Laterality: N/A;   POLYPECTOMY      REVIEW OF SYSTEMS:  A comprehensive review of systems was negative except for: Constitutional: positive for fatigue Respiratory: positive for dyspnea on exertion   PHYSICAL EXAMINATION: General appearance: alert, cooperative, fatigued, and no distress Head: Normocephalic, without obvious abnormality, atraumatic Neck: no adenopathy, no JVD, supple, symmetrical, trachea midline, and thyroid not enlarged, symmetric, no tenderness/mass/nodules Lymph nodes: Cervical, supraclavicular, and axillary nodes  normal. Resp: clear to auscultation bilaterally Back: symmetric, no curvature. ROM normal. No CVA tenderness. Cardio: regular rate and rhythm, S1, S2 normal, no murmur, click, rub or gallop GI: soft, non-tender; bowel sounds normal; no masses,  no organomegaly Extremities: extremities normal, atraumatic, no cyanosis or edema  ECOG PERFORMANCE STATUS: 1 - Symptomatic but completely ambulatory  Blood pressure (!) 147/54, pulse (!) 54, temperature 97.7 F (36.5 C), temperature source Tympanic, resp. rate 19, height 5' (1.524 m), SpO2 98 %.  LABORATORY DATA: Lab Results  Component Value Date   WBC 8.4 09/12/2021   HGB 8.6 (L) 09/12/2021   HCT 27.3 (L) 09/12/2021   MCV 85.0 09/12/2021   PLT 154 09/12/2021      Chemistry      Component Value Date/Time   NA 138 08/14/2021 1500   NA 142 04/11/2016 1007   K 4.0 08/14/2021 1500   K 3.3 (L) 04/11/2016 1007   CL 103 08/14/2021 1500   CO2 28 08/14/2021 1500   CO2 31 (H) 04/11/2016 1007   BUN 63 (H) 08/14/2021 1500   BUN 24.4 04/11/2016 1007   CREATININE 1.67 (H) 08/14/2021 1500   CREATININE 1.36 (H) 02/04/2020 1443   CREATININE 1.1 04/11/2016 1007      Component Value Date/Time   CALCIUM 9.7 08/14/2021 1500   CALCIUM 9.8 04/11/2016 1007   ALKPHOS 39 08/14/2021 1500   ALKPHOS 82 04/11/2016 1007   AST 9 (L) 08/14/2021 1500   AST 12 04/11/2016 1007   ALT <5 08/14/2021 1500   ALT 12 04/11/2016 1007   BILITOT 0.2 (L) 08/14/2021 1500   BILITOT 0.35 04/11/2016 1007       RADIOGRAPHIC STUDIES: CT Chest W Contrast  Result Date: 08/14/2021 CLINICAL DATA:  Non-small cell lung cancer staging. Chemotherapy ongoing. Patient underwent partial transverse colon resection with resection of a mesenteric mass on 08/15/2020. EXAM: CT CHEST, ABDOMEN, AND PELVIS WITH CONTRAST TECHNIQUE: Multidetector CT imaging of the chest, abdomen and pelvis was performed following the standard protocol during bolus administration of intravenous contrast.  RADIATION DOSE REDUCTION: This exam was performed according to the departmental dose-optimization program which includes automated exposure control, adjustment of the mA and/or kV according to patient size and/or use of iterative reconstruction technique. CONTRAST:  79m OMNIPAQUE IOHEXOL 300 MG/ML  SOLN COMPARISON:  PET-CT 06/07/2021.  Abdominopelvic CT 08/09/2020. FINDINGS: CT CHEST FINDINGS Cardiovascular: There is diffuse atherosclerosis of the aorta, great vessels and coronary arteries. No acute vascular findings are identified. The heart is mildly enlarged. There is no significant pericardial fluid. Mediastinum/Nodes: The previously demonstrated extensive hypermetabolic mediastinal and hilar adenopathy has substantially improved compared with the prior PET-CT. There is a residual subcarinal node measuring 1.3 cm on image 26/2. No axillary adenopathy identified. The  thyroid gland, trachea and esophagus appear stable without significant findings. Lungs/Pleura: Previously demonstrated loculated left pleural effusion has decreased in volume compared with the prior PET-CT, and no residual pleural nodularity identified. There is a pleural drain in place. There is improved left lung aeration. Residual atelectasis is present in the left lower lobe. No suspicious nodules are seen within the aerated portions of the lungs. There is underlying mild to moderate centrilobular emphysema. Musculoskeletal/Chest wall: No chest wall mass or suspicious osseous findings. CT ABDOMEN AND PELVIS FINDINGS Hepatobiliary: The liver is normal in density without suspicious focal abnormality. No evidence of gallstones, gallbladder wall thickening or biliary dilatation. Pancreas: Unremarkable. No pancreatic ductal dilatation or surrounding inflammatory changes. Spleen: 10 mm low-density lesion inferiorly in the spleen on image 59/2 is unchanged from previous CT and does not appear to correspond with the area of hypermetabolism on PET-CT.  The spleen otherwise appears unremarkable. Adrenals/Urinary Tract: Both adrenal glands appear normal. Again demonstrated is a heterogeneous mass arising from the posterior interpolar region of the right kidney, measuring 4.1 x 3.3 cm on image 60/2, similar in size to previous CT. This remains concerning for renal neoplasm (likely renal cell carcinoma). Multiple other renal lesions are present with uniform low and intermediate density, likely complex cysts. No evidence of urinary tract calculus or hydronephrosis. The bladder appears unremarkable. Stomach/Bowel: Enteric contrast was administered and has passed into the distal small bowel. There is prominent ingested material within the stomach. No evidence of bowel wall thickening, distention or surrounding inflammation. There are 2 large ventral hernias containing portions of the colon, appendix and distal small bowel. There is a probable diverting colostomy of the transverse colon. Prominent stool is present throughout the colon. Difficult to exclude an intraluminal mass within the cecum, measuring 2.8 x 2.6 cm on image 82/2, although this was not clearly seen on prior PET-CT. The hypermetabolic nodule along the posterior aspect of hernia on PET-CT is no longer seen and apparently was resected on 08/11/2020. Vascular/Lymphatic: There are no enlarged abdominal or pelvic lymph nodes. Stable small retroperitoneal lymph nodes. Diffuse aortic and branch vessel atherosclerosis. Reproductive: The uterus and ovaries appear unremarkable. No adnexal mass. Other: As above, large ventral hernias associated with the patient's transverse colostomy. Musculoskeletal: No acute or significant osseous findings. Multilevel spondylosis. Chronic superior endplate compression deformities at L3 and L4. IMPRESSION: 1. Since PET-CT of 2 months ago, the left pleural effusion has decreased in volume, and there is improved aeration of the left lung and improved mediastinal and hilar adenopathy,  consistent with response to therapy. 2. Interval postsurgical changes related to transverse colostomy and mesenteric nodule resection. An intraluminal cecal lesion is difficult to exclude on this examination, although was not seen on prior PET-CT. Recommend attention on follow-up. 3. Persistent heterogeneously enhancing right renal mass which remains suspicious for renal cell carcinoma. This is not significantly changed from prior CT of 1 year ago, although has enlarged from 2017. There are additional simple and mildly complex renal cysts bilaterally which are incompletely characterized by this examination. If it would change the patient's current management, the renal lesions could be further evaluated by dedicated renal MRI (without and with contrast). 4. No typical metastatic disease identified in the abdomen or pelvis. 5. Persistent large ventral hernias containing distal small bowel, appendix and colon. 6. Aortic Atherosclerosis (ICD10-I70.0) and Emphysema (ICD10-J43.9). Electronically Signed   By: Richardean Sale M.D.   On: 08/14/2021 17:14   MR Brain W Wo Contrast  Result Date: 09/09/2021 CLINICAL  DATA:  Lung cancer, follow-up abnormal MRI EXAM: MRI HEAD WITHOUT AND WITH CONTRAST TECHNIQUE: Multiplanar, multiecho pulse sequences of the brain and surrounding structures were obtained without and with intravenous contrast. CONTRAST:  68m GADAVIST GADOBUTROL 1 MMOL/ML IV SOLN COMPARISON:  06/02/2021 FINDINGS: Brain: Punctate focus of diffusion hyperintensity in the right parietal lobe has resolved. There is no enhancement at this location. There is no acute infarction or intracranial hemorrhage. Ventricles and sulci are stable in size and configuration. Patchy foci of T2 hyperintensity in the supratentorial and pontine white matter are nonspecific but may reflect stable chronic microvascular ischemic changes. There is no intracranial mass, mass effect, or edema. There is no hydrocephalus or extra-axial fluid  collection. No abnormal enhancement. Vascular: Major vessel flow voids at the skull base are preserved. Skull and upper cervical spine: Normal marrow signal is preserved. Sinuses/Orbits: Minor mucosal thickening.  Orbits are unremarkable. Other: Sella is partially empty.  Mastoid air cells are clear. IMPRESSION: Focus abnormal signal on the prior study has resolved and was likely ischemic. There is no evidence of intracranial metastatic disease. Electronically Signed   By: PMacy MisM.D.   On: 09/09/2021 13:18   CT Abdomen Pelvis W Contrast  Result Date: 08/14/2021 CLINICAL DATA:  Non-small cell lung cancer staging. Chemotherapy ongoing. Patient underwent partial transverse colon resection with resection of a mesenteric mass on 08/15/2020. EXAM: CT CHEST, ABDOMEN, AND PELVIS WITH CONTRAST TECHNIQUE: Multidetector CT imaging of the chest, abdomen and pelvis was performed following the standard protocol during bolus administration of intravenous contrast. RADIATION DOSE REDUCTION: This exam was performed according to the departmental dose-optimization program which includes automated exposure control, adjustment of the mA and/or kV according to patient size and/or use of iterative reconstruction technique. CONTRAST:  579mOMNIPAQUE IOHEXOL 300 MG/ML  SOLN COMPARISON:  PET-CT 06/07/2021.  Abdominopelvic CT 08/09/2020. FINDINGS: CT CHEST FINDINGS Cardiovascular: There is diffuse atherosclerosis of the aorta, great vessels and coronary arteries. No acute vascular findings are identified. The heart is mildly enlarged. There is no significant pericardial fluid. Mediastinum/Nodes: The previously demonstrated extensive hypermetabolic mediastinal and hilar adenopathy has substantially improved compared with the prior PET-CT. There is a residual subcarinal node measuring 1.3 cm on image 26/2. No axillary adenopathy identified. The thyroid gland, trachea and esophagus appear stable without significant findings.  Lungs/Pleura: Previously demonstrated loculated left pleural effusion has decreased in volume compared with the prior PET-CT, and no residual pleural nodularity identified. There is a pleural drain in place. There is improved left lung aeration. Residual atelectasis is present in the left lower lobe. No suspicious nodules are seen within the aerated portions of the lungs. There is underlying mild to moderate centrilobular emphysema. Musculoskeletal/Chest wall: No chest wall mass or suspicious osseous findings. CT ABDOMEN AND PELVIS FINDINGS Hepatobiliary: The liver is normal in density without suspicious focal abnormality. No evidence of gallstones, gallbladder wall thickening or biliary dilatation. Pancreas: Unremarkable. No pancreatic ductal dilatation or surrounding inflammatory changes. Spleen: 10 mm low-density lesion inferiorly in the spleen on image 59/2 is unchanged from previous CT and does not appear to correspond with the area of hypermetabolism on PET-CT. The spleen otherwise appears unremarkable. Adrenals/Urinary Tract: Both adrenal glands appear normal. Again demonstrated is a heterogeneous mass arising from the posterior interpolar region of the right kidney, measuring 4.1 x 3.3 cm on image 60/2, similar in size to previous CT. This remains concerning for renal neoplasm (likely renal cell carcinoma). Multiple other renal lesions are present with uniform low and intermediate  density, likely complex cysts. No evidence of urinary tract calculus or hydronephrosis. The bladder appears unremarkable. Stomach/Bowel: Enteric contrast was administered and has passed into the distal small bowel. There is prominent ingested material within the stomach. No evidence of bowel wall thickening, distention or surrounding inflammation. There are 2 large ventral hernias containing portions of the colon, appendix and distal small bowel. There is a probable diverting colostomy of the transverse colon. Prominent stool is  present throughout the colon. Difficult to exclude an intraluminal mass within the cecum, measuring 2.8 x 2.6 cm on image 82/2, although this was not clearly seen on prior PET-CT. The hypermetabolic nodule along the posterior aspect of hernia on PET-CT is no longer seen and apparently was resected on 08/11/2020. Vascular/Lymphatic: There are no enlarged abdominal or pelvic lymph nodes. Stable small retroperitoneal lymph nodes. Diffuse aortic and branch vessel atherosclerosis. Reproductive: The uterus and ovaries appear unremarkable. No adnexal mass. Other: As above, large ventral hernias associated with the patient's transverse colostomy. Musculoskeletal: No acute or significant osseous findings. Multilevel spondylosis. Chronic superior endplate compression deformities at L3 and L4. IMPRESSION: 1. Since PET-CT of 2 months ago, the left pleural effusion has decreased in volume, and there is improved aeration of the left lung and improved mediastinal and hilar adenopathy, consistent with response to therapy. 2. Interval postsurgical changes related to transverse colostomy and mesenteric nodule resection. An intraluminal cecal lesion is difficult to exclude on this examination, although was not seen on prior PET-CT. Recommend attention on follow-up. 3. Persistent heterogeneously enhancing right renal mass which remains suspicious for renal cell carcinoma. This is not significantly changed from prior CT of 1 year ago, although has enlarged from 2017. There are additional simple and mildly complex renal cysts bilaterally which are incompletely characterized by this examination. If it would change the patient's current management, the renal lesions could be further evaluated by dedicated renal MRI (without and with contrast). 4. No typical metastatic disease identified in the abdomen or pelvis. 5. Persistent large ventral hernias containing distal small bowel, appendix and colon. 6. Aortic Atherosclerosis (ICD10-I70.0)  and Emphysema (ICD10-J43.9). Electronically Signed   By: Richardean Sale M.D.   On: 08/14/2021 17:14    ASSESSMENT AND PLAN: This is a very pleasant 81 years old with a stage IV (TX, N3, M1 C) non-small cell lung cancer, adenocarcinoma with positive EGFR mutation exon 21 (L858R) diagnosed in October 2022 1 presented with left primary lung lesion in addition to widespread metastatic disease in the thoracic nodes, left pleural space as well as abdominal pelvic lymph node and bone metastasis. The patient is currently undergoing treatment with Tagrisso 80 mg p.o. daily started June 21, 2021.  She is status post 3 months of treatment.  The patient has been tolerating this treatment well except for the skin rash and she is requesting refill of miconazole. She had repeat MRI of the brain performed recently.  It showed no concerning finding for metastatic disease to the brain and the abnormal focus seen on the previous MRI was likely ischemic in origin and had resolved But this could be also as a result of the treatment with Tagrisso if it is a metastatic lesion. I recommended for the patient to continue her current treatment with Tagrisso with the same dose. I will see her back for follow-up visit in 1 months for evaluation and repeat blood work. For the anemia, she received 2 units of PRBCs transfusion recently.  The patient will continue on the oral iron tablets  for now. For the skin rash, I gave her a refill of miconazole. The patient was advised to call immediately if she has any other concerning symptoms in the interval. The patient voices understanding of current disease status and treatment options and is in agreement with the current care plan.  All questions were answered. The patient knows to call the clinic with any problems, questions or concerns. We can certainly see the patient much sooner if necessary.   Disclaimer: This note was dictated with voice recognition software. Similar sounding  words can inadvertently be transcribed and may not be corrected upon review.

## 2021-09-14 DIAGNOSIS — I2781 Cor pulmonale (chronic): Secondary | ICD-10-CM | POA: Diagnosis not present

## 2021-09-14 DIAGNOSIS — M4802 Spinal stenosis, cervical region: Secondary | ICD-10-CM | POA: Diagnosis not present

## 2021-09-14 DIAGNOSIS — M19071 Primary osteoarthritis, right ankle and foot: Secondary | ICD-10-CM | POA: Diagnosis not present

## 2021-09-14 DIAGNOSIS — M810 Age-related osteoporosis without current pathological fracture: Secondary | ICD-10-CM | POA: Diagnosis not present

## 2021-09-14 DIAGNOSIS — D631 Anemia in chronic kidney disease: Secondary | ICD-10-CM | POA: Diagnosis not present

## 2021-09-14 DIAGNOSIS — I700 Atherosclerosis of aorta: Secondary | ICD-10-CM | POA: Diagnosis not present

## 2021-09-14 DIAGNOSIS — D630 Anemia in neoplastic disease: Secondary | ICD-10-CM | POA: Diagnosis not present

## 2021-09-14 DIAGNOSIS — N1832 Chronic kidney disease, stage 3b: Secondary | ICD-10-CM | POA: Diagnosis not present

## 2021-09-14 DIAGNOSIS — Z433 Encounter for attention to colostomy: Secondary | ICD-10-CM | POA: Diagnosis not present

## 2021-09-14 DIAGNOSIS — M479 Spondylosis, unspecified: Secondary | ICD-10-CM | POA: Diagnosis not present

## 2021-09-14 DIAGNOSIS — M19072 Primary osteoarthritis, left ankle and foot: Secondary | ICD-10-CM | POA: Diagnosis not present

## 2021-09-14 DIAGNOSIS — E785 Hyperlipidemia, unspecified: Secondary | ICD-10-CM | POA: Diagnosis not present

## 2021-09-14 DIAGNOSIS — M1030 Gout due to renal impairment, unspecified site: Secondary | ICD-10-CM | POA: Diagnosis not present

## 2021-09-14 DIAGNOSIS — J9621 Acute and chronic respiratory failure with hypoxia: Secondary | ICD-10-CM | POA: Diagnosis not present

## 2021-09-14 DIAGNOSIS — Z4801 Encounter for change or removal of surgical wound dressing: Secondary | ICD-10-CM | POA: Diagnosis not present

## 2021-09-14 DIAGNOSIS — D472 Monoclonal gammopathy: Secondary | ICD-10-CM | POA: Diagnosis not present

## 2021-09-14 DIAGNOSIS — H539 Unspecified visual disturbance: Secondary | ICD-10-CM | POA: Diagnosis not present

## 2021-09-14 DIAGNOSIS — G4733 Obstructive sleep apnea (adult) (pediatric): Secondary | ICD-10-CM | POA: Diagnosis not present

## 2021-09-14 DIAGNOSIS — I878 Other specified disorders of veins: Secondary | ICD-10-CM | POA: Diagnosis not present

## 2021-09-14 DIAGNOSIS — J449 Chronic obstructive pulmonary disease, unspecified: Secondary | ICD-10-CM | POA: Diagnosis not present

## 2021-09-14 DIAGNOSIS — I5042 Chronic combined systolic (congestive) and diastolic (congestive) heart failure: Secondary | ICD-10-CM | POA: Diagnosis not present

## 2021-09-14 DIAGNOSIS — D519 Vitamin B12 deficiency anemia, unspecified: Secondary | ICD-10-CM | POA: Diagnosis not present

## 2021-09-14 DIAGNOSIS — I130 Hypertensive heart and chronic kidney disease with heart failure and stage 1 through stage 4 chronic kidney disease, or unspecified chronic kidney disease: Secondary | ICD-10-CM | POA: Diagnosis not present

## 2021-09-14 DIAGNOSIS — Z48813 Encounter for surgical aftercare following surgery on the respiratory system: Secondary | ICD-10-CM | POA: Diagnosis not present

## 2021-09-14 DIAGNOSIS — C3490 Malignant neoplasm of unspecified part of unspecified bronchus or lung: Secondary | ICD-10-CM | POA: Diagnosis not present

## 2021-09-15 ENCOUNTER — Telehealth: Payer: Self-pay

## 2021-09-15 NOTE — Chronic Care Management (AMB) (Signed)
Chronic Care Management Pharmacy Assistant   Name: [name]  MRN: [id number] DOB: 1940/09/15  [name] is an 81 y.o. year old female who presents for his follow-up CCM visit with the clinical pharmacist.  Reason for Encounter: Disease State-General    Recent office visits:  None ID  Recent consult visits:  09/12/21 Curt Bears, MD-Oncology (Adenocarcinoma of left lung) Blood work ordered, Med changes: miconazole nitrate  08/16/21 Curt Bears, MD-Oncology (Adenocarcinoma of left lung) Blood work ordered, no med [place] [facility] visits:  None in previous 6 months  Medications: Outpatient Encounter Medications as of 09/15/2021  Medication Sig Note   acetaminophen (TYLENOL) 325 MG tablet Take 325-650 mg by mouth daily as needed for moderate pain, fever or headache.     albuterol (PROAIR HFA) 108 (90 Base) MCG/ACT inhaler Inhale 1-2 puffs into the lungs every 6 (six) hours as needed for wheezing or shortness of breath.    allopurinol (ZYLOPRIM) 100 MG tablet TAKE 1 TABLET(100 MG) BY MOUTH DAILY    clonazePAM (KLONOPIN) 1 MG tablet Take 1 tablet (1 mg total) by mouth 2 (two) times daily as needed for anxiety. 06/27/2021: Pt instructed -do not take Kkonipin at same time as MS contin .   ferrous sulfate 325 (65 FE) MG tablet Take 1 tablet (325 mg total) by mouth 2 (two) times daily with a meal.    Fluticasone-Umeclidin-Vilant (TRELEGY ELLIPTA) 100-62.5-25 MCG/INH AEPB Inhale 1 puff into the lungs daily.    isosorbide-hydrALAZINE (BIDIL) 20-37.5 MG tablet TAKE 1 TABLET BY MOUTH THREE TIMES DAILY    ketoconazole (NIZORAL) 2 % cream Apply 1 application topically daily.    metoprolol tartrate (LOPRESSOR) 25 MG tablet TAKE 1 TABLET(25 MG) BY MOUTH TWICE DAILY (Patient taking differently: Take 12.5 mg by mouth 2 (two) times daily.)    miconazole (MICATIN) 2 % cream Apply 1 application topically 2 (two) times daily.    morphine (MS CONTIN) 15 MG 12 hr tablet Take 1  tablet (15 mg total) by mouth 2 (two) times daily as needed for pain.    osimertinib mesylate (TAGRISSO) 80 MG tablet Take 1 tablet (80 mg total) by mouth daily.    potassium chloride SA (KLOR-CON M) 20 MEQ tablet Take 1 tablet (20 mEq total) by mouth daily.    prochlorperazine (COMPAZINE) 10 MG tablet Take 1 tablet (10 mg total) by mouth every 6 (six) hours as needed. 06/08/2021: Has not picked up yet   torsemide (DEMADEX) 20 MG tablet TAKE 2 TABLETS(40 MG) BY MOUTH EVERY MORNING    Facility-Administered Encounter Medications as of 09/15/2021  Medication   cyanocobalamin ((VITAMIN B-12)) injection 1,000 mcg   Have you had any problems recently with your health?Patient states that she does not have any new health issues that Dr. Ronnald Ramp does not know about.  Have you had any problems with your pharmacy?Patient states that she has not problems with getting medications from the pharmacy or the cost of medications  What issues or side effects are you having with your medications?Patient states that Dr. Ronnald Ramp prescribed Trelegy inhale and it burns her throat  What would you like me to pass along to [facility] for them to help you with? Patient states that she was once on the Breo inhaler which was better and would like to go bact on that inhaler  What can we do to take care of you better? Patient states the only thing she needs is the [facility] inhaler  Care Gaps:  Colonoscopy-NA Diabetic Foot Exam-11/05/17 Mammogram-NA Ophthalmology-09/23/18 Dexa Scan - NA Annual Well Visit - NA Micro albumin-11/05/17 Hemoglobin A1c- 11/05/17   Star Rating Drugs: None ID   [name] Clinical Pharmacist Assistant [phone]

## 2021-09-18 ENCOUNTER — Telehealth: Payer: Self-pay | Admitting: Medical Oncology

## 2021-09-18 DIAGNOSIS — Z933 Colostomy status: Secondary | ICD-10-CM | POA: Diagnosis not present

## 2021-09-18 NOTE — Telephone Encounter (Signed)
Tagrisso supply- pt states she has 2 bottles of  Tagrisso . One bottle is almost empty and she has another full bottle.  She does not know if she is on [place] and me pt assistance program. Message sent to [employer].

## 2021-09-19 ENCOUNTER — Other Ambulatory Visit: Payer: Self-pay | Admitting: Internal Medicine

## 2021-09-19 DIAGNOSIS — J9611 Chronic respiratory failure with hypoxia: Secondary | ICD-10-CM

## 2021-09-19 DIAGNOSIS — J432 Centrilobular emphysema: Secondary | ICD-10-CM

## 2021-09-19 DIAGNOSIS — J411 Mucopurulent chronic bronchitis: Secondary | ICD-10-CM

## 2021-09-19 MED ORDER — FLUTICASONE FUROATE-VILANTEROL 100-25 MCG/ACT IN AEPB: 1.0000 | INHALATION_SPRAY | Freq: Every day | RESPIRATORY_TRACT | 1 refills | Status: DC

## 2021-09-19 NOTE — Telephone Encounter (Signed)
Per message for assistant, patient found breo more effective and better tolerated than trelegy - requesting switch back to breo if agreeable   Thanks, Linna Hoff

## 2021-09-20 DIAGNOSIS — D472 Monoclonal gammopathy: Secondary | ICD-10-CM | POA: Diagnosis not present

## 2021-09-20 DIAGNOSIS — M19071 Primary osteoarthritis, right ankle and foot: Secondary | ICD-10-CM | POA: Diagnosis not present

## 2021-09-20 DIAGNOSIS — J449 Chronic obstructive pulmonary disease, unspecified: Secondary | ICD-10-CM | POA: Diagnosis not present

## 2021-09-20 DIAGNOSIS — Z433 Encounter for attention to colostomy: Secondary | ICD-10-CM | POA: Diagnosis not present

## 2021-09-20 DIAGNOSIS — I5042 Chronic combined systolic (congestive) and diastolic (congestive) heart failure: Secondary | ICD-10-CM | POA: Diagnosis not present

## 2021-09-20 DIAGNOSIS — J9621 Acute and chronic respiratory failure with hypoxia: Secondary | ICD-10-CM | POA: Diagnosis not present

## 2021-09-20 DIAGNOSIS — D519 Vitamin B12 deficiency anemia, unspecified: Secondary | ICD-10-CM | POA: Diagnosis not present

## 2021-09-20 DIAGNOSIS — H539 Unspecified visual disturbance: Secondary | ICD-10-CM | POA: Diagnosis not present

## 2021-09-20 DIAGNOSIS — M4802 Spinal stenosis, cervical region: Secondary | ICD-10-CM | POA: Diagnosis not present

## 2021-09-20 DIAGNOSIS — D630 Anemia in neoplastic disease: Secondary | ICD-10-CM | POA: Diagnosis not present

## 2021-09-20 DIAGNOSIS — I2781 Cor pulmonale (chronic): Secondary | ICD-10-CM | POA: Diagnosis not present

## 2021-09-20 DIAGNOSIS — Z4801 Encounter for change or removal of surgical wound dressing: Secondary | ICD-10-CM | POA: Diagnosis not present

## 2021-09-20 DIAGNOSIS — N1832 Chronic kidney disease, stage 3b: Secondary | ICD-10-CM | POA: Diagnosis not present

## 2021-09-20 DIAGNOSIS — M810 Age-related osteoporosis without current pathological fracture: Secondary | ICD-10-CM | POA: Diagnosis not present

## 2021-09-20 DIAGNOSIS — M1030 Gout due to renal impairment, unspecified site: Secondary | ICD-10-CM | POA: Diagnosis not present

## 2021-09-20 DIAGNOSIS — D631 Anemia in chronic kidney disease: Secondary | ICD-10-CM | POA: Diagnosis not present

## 2021-09-20 DIAGNOSIS — Z48813 Encounter for surgical aftercare following surgery on the respiratory system: Secondary | ICD-10-CM | POA: Diagnosis not present

## 2021-09-20 DIAGNOSIS — G4733 Obstructive sleep apnea (adult) (pediatric): Secondary | ICD-10-CM | POA: Diagnosis not present

## 2021-09-20 DIAGNOSIS — I878 Other specified disorders of veins: Secondary | ICD-10-CM | POA: Diagnosis not present

## 2021-09-20 DIAGNOSIS — I130 Hypertensive heart and chronic kidney disease with heart failure and stage 1 through stage 4 chronic kidney disease, or unspecified chronic kidney disease: Secondary | ICD-10-CM | POA: Diagnosis not present

## 2021-09-20 DIAGNOSIS — M19072 Primary osteoarthritis, left ankle and foot: Secondary | ICD-10-CM | POA: Diagnosis not present

## 2021-09-20 DIAGNOSIS — C3490 Malignant neoplasm of unspecified part of unspecified bronchus or lung: Secondary | ICD-10-CM | POA: Diagnosis not present

## 2021-09-20 DIAGNOSIS — E785 Hyperlipidemia, unspecified: Secondary | ICD-10-CM | POA: Diagnosis not present

## 2021-09-20 DIAGNOSIS — M479 Spondylosis, unspecified: Secondary | ICD-10-CM | POA: Diagnosis not present

## 2021-09-20 DIAGNOSIS — I700 Atherosclerosis of aorta: Secondary | ICD-10-CM | POA: Diagnosis not present

## 2021-09-27 DIAGNOSIS — N1832 Chronic kidney disease, stage 3b: Secondary | ICD-10-CM | POA: Diagnosis not present

## 2021-09-27 DIAGNOSIS — M19072 Primary osteoarthritis, left ankle and foot: Secondary | ICD-10-CM | POA: Diagnosis not present

## 2021-09-27 DIAGNOSIS — I5042 Chronic combined systolic (congestive) and diastolic (congestive) heart failure: Secondary | ICD-10-CM | POA: Diagnosis not present

## 2021-09-27 DIAGNOSIS — M810 Age-related osteoporosis without current pathological fracture: Secondary | ICD-10-CM | POA: Diagnosis not present

## 2021-09-27 DIAGNOSIS — E785 Hyperlipidemia, unspecified: Secondary | ICD-10-CM | POA: Diagnosis not present

## 2021-09-27 DIAGNOSIS — H539 Unspecified visual disturbance: Secondary | ICD-10-CM | POA: Diagnosis not present

## 2021-09-27 DIAGNOSIS — G4733 Obstructive sleep apnea (adult) (pediatric): Secondary | ICD-10-CM | POA: Diagnosis not present

## 2021-09-27 DIAGNOSIS — I130 Hypertensive heart and chronic kidney disease with heart failure and stage 1 through stage 4 chronic kidney disease, or unspecified chronic kidney disease: Secondary | ICD-10-CM | POA: Diagnosis not present

## 2021-09-27 DIAGNOSIS — C3490 Malignant neoplasm of unspecified part of unspecified bronchus or lung: Secondary | ICD-10-CM | POA: Diagnosis not present

## 2021-09-27 DIAGNOSIS — D631 Anemia in chronic kidney disease: Secondary | ICD-10-CM | POA: Diagnosis not present

## 2021-09-27 DIAGNOSIS — D472 Monoclonal gammopathy: Secondary | ICD-10-CM | POA: Diagnosis not present

## 2021-09-27 DIAGNOSIS — Z433 Encounter for attention to colostomy: Secondary | ICD-10-CM | POA: Diagnosis not present

## 2021-09-27 DIAGNOSIS — M479 Spondylosis, unspecified: Secondary | ICD-10-CM | POA: Diagnosis not present

## 2021-09-27 DIAGNOSIS — Z48813 Encounter for surgical aftercare following surgery on the respiratory system: Secondary | ICD-10-CM | POA: Diagnosis not present

## 2021-09-27 DIAGNOSIS — M19071 Primary osteoarthritis, right ankle and foot: Secondary | ICD-10-CM | POA: Diagnosis not present

## 2021-09-27 DIAGNOSIS — J9621 Acute and chronic respiratory failure with hypoxia: Secondary | ICD-10-CM | POA: Diagnosis not present

## 2021-09-27 DIAGNOSIS — I878 Other specified disorders of veins: Secondary | ICD-10-CM | POA: Diagnosis not present

## 2021-09-27 DIAGNOSIS — M1030 Gout due to renal impairment, unspecified site: Secondary | ICD-10-CM | POA: Diagnosis not present

## 2021-09-27 DIAGNOSIS — I700 Atherosclerosis of aorta: Secondary | ICD-10-CM | POA: Diagnosis not present

## 2021-09-27 DIAGNOSIS — Z4801 Encounter for change or removal of surgical wound dressing: Secondary | ICD-10-CM | POA: Diagnosis not present

## 2021-09-27 DIAGNOSIS — J449 Chronic obstructive pulmonary disease, unspecified: Secondary | ICD-10-CM | POA: Diagnosis not present

## 2021-09-27 DIAGNOSIS — I2781 Cor pulmonale (chronic): Secondary | ICD-10-CM | POA: Diagnosis not present

## 2021-09-27 DIAGNOSIS — D630 Anemia in neoplastic disease: Secondary | ICD-10-CM | POA: Diagnosis not present

## 2021-09-27 DIAGNOSIS — D519 Vitamin B12 deficiency anemia, unspecified: Secondary | ICD-10-CM | POA: Diagnosis not present

## 2021-09-27 DIAGNOSIS — M4802 Spinal stenosis, cervical region: Secondary | ICD-10-CM | POA: Diagnosis not present

## 2021-10-03 ENCOUNTER — Ambulatory Visit: Payer: Medicare Other | Admitting: Thoracic Surgery (Cardiothoracic Vascular Surgery)

## 2021-10-03 ENCOUNTER — Other Ambulatory Visit: Payer: Self-pay

## 2021-10-03 ENCOUNTER — Encounter: Payer: Self-pay | Admitting: Thoracic Surgery (Cardiothoracic Vascular Surgery)

## 2021-10-03 ENCOUNTER — Ambulatory Visit
Admission: RE | Admit: 2021-10-03 | Discharge: 2021-10-03 | Disposition: A | Discharge status: Home / Self Care | Payer: Medicare Other | Source: Ambulatory Visit | Attending: Thoracic Surgery (Cardiothoracic Vascular Surgery) | Admitting: Thoracic Surgery (Cardiothoracic Vascular Surgery)

## 2021-10-03 VITALS — BP 139/47 | HR 57 | Resp 20 | Ht 60.0 in | Wt 162.6 lb

## 2021-10-03 DIAGNOSIS — Z452 Encounter for adjustment and management of vascular access device: Secondary | ICD-10-CM | POA: Diagnosis not present

## 2021-10-03 DIAGNOSIS — J90 Pleural effusion, not elsewhere classified: Secondary | ICD-10-CM

## 2021-10-03 DIAGNOSIS — J984 Other disorders of lung: Secondary | ICD-10-CM | POA: Diagnosis not present

## 2021-10-03 DIAGNOSIS — I700 Atherosclerosis of aorta: Secondary | ICD-10-CM | POA: Diagnosis not present

## 2021-10-03 NOTE — Progress Notes (Signed)
? ?   ?[address] ?      [name] [address] ?            [phone]   ? ?HPI: Mrs. Italiano returns for follow-up regarding her pleural catheter.   ? ?Brionna Romanek is a an 81 year old woman with a history of hypertension, hyperlipidemia, atrial fibrillation, congestive heart failure, COPD, cor pulmonale, gout, asthma, angiodysplasia, monoclonal gammopathy, obstructive sleep apnea, and stage IV lung cancer.  She presented in October with worsening shortness of breath and had a large left pleural effusion.  She had multiple thoracenteses.  Cytology was positive for metastatic adenocarcinoma. ? ?I placed a pleural catheter in November.  We drained about 1.4 L of fluid initially. ? ?She currently is draining the catheter about every 2 weeks.  She has had minimal output the past 5 or 6 drainages.  That dates back to nearly December.  Recent chest CT showed small amount of loculated fluid at the left base. ? ?Past Medical History:  ?Diagnosis Date  ? Allergy   ? Angiodysplasia of cecum   ? Angiodysplasia of duodenum   ? Arthritis   ? Asteatotic eczema 02/25/2016  ? Asthma   ? Atrial fibrillation ([place]) 10/17/2017  ? Blood transfusion without reported diagnosis   ? Cancer [facility])   ? Cataract   ? CHF (congestive heart failure), NYHA class III, chronic, combined ([place]) 08/10/2017  ? Colonic polyp 02/16/2008  ? Tubular adenoma  ? COPD (chronic obstructive pulmonary disease) ([place]) 01/27/1998  ? Cor pulmonale ([place]) 11/16/2014  ? GERD 07/30/1992  ? Gout   ? HTN (hypertension) 07/30/1988  ? Hyperlipidemia with target LDL less than 100 11/27/1996  ?     ? Kidney stone 1960, 1972, 1991  ? MGUS (monoclonal gammopathy of unknown significance) 11/08/2016  ? Morbid obesity ([place]) 04/19/2010  ? She agrees to work on her lifestyle modifications to help her lose weight.   ? OSA (obstructive sleep apnea) 06/22/2013  ? Osteopenia, senile 02/05/2013  ? July 2014  -2.1 left femur -1.9 left forearm   ? Prediabetes 11/13/2006   ? Renal insufficiency   ? Stenosis of cervical spine with myelopathy ([place]) 01/01/2018  ? Ulcer of leg, chronic, right ([place]) 10/10/2011  ? ULCERATIVE COLITIS-LEFT SIDE 03/19/2008  ?     ? Vitamin B12 deficiency anemia 11/13/2006  ?     ? Vitamin D deficiency 02/06/2013  ? ? ?Current Outpatient Medications  ?Medication Sig Dispense Refill  ? acetaminophen (TYLENOL) 325 MG tablet Take 325-650 mg by mouth daily as needed for moderate pain, fever or headache.     ? albuterol (PROAIR HFA) 108 (90 Base) MCG/ACT inhaler Inhale 1-2 puffs into the lungs every 6 (six) hours as needed for wheezing or shortness of breath. 18 g 11  ? allopurinol (ZYLOPRIM) 100 MG tablet TAKE 1 TABLET(100 MG) BY MOUTH DAILY 90 tablet 1  ? clonazePAM (KLONOPIN) 1 MG tablet Take 1 tablet (1 mg total) by mouth 2 (two) times daily as needed for anxiety. 180 tablet 0  ? ferrous sulfate 325 (65 FE) MG tablet Take 1 tablet (325 mg total) by mouth 2 (two) times daily with a meal. 180 tablet 1  ? fluticasone furoate-vilanterol (BREO ELLIPTA) 100-25 MCG/ACT AEPB Inhale 1 puff into the lungs daily. 120 each 1  ? isosorbide-hydrALAZINE (BIDIL) 20-37.5 MG tablet TAKE 1 TABLET BY MOUTH THREE TIMES DAILY 270 tablet 0  ? metoprolol tartrate (LOPRESSOR) 25 MG tablet TAKE 1 TABLET(25 MG) BY  MOUTH TWICE DAILY (Patient taking differently: Take 12.5 mg by mouth 2 (two) times daily.) 180 tablet 1  ? morphine (MS CONTIN) 15 MG 12 hr tablet Take 1 tablet (15 mg total) by mouth 2 (two) times daily as needed for pain. 60 tablet 0  ? osimertinib mesylate (TAGRISSO) 80 MG tablet Take 1 tablet (80 mg total) by mouth daily. 30 tablet 3  ? prochlorperazine (COMPAZINE) 10 MG tablet Take 1 tablet (10 mg total) by mouth every 6 (six) hours as needed. 30 tablet 2  ? torsemide (DEMADEX) 20 MG tablet TAKE 2 TABLETS(40 MG) BY MOUTH EVERY MORNING 180 tablet 0  ? ?Current Facility-Administered Medications  ?Medication Dose Route Frequency Provider Last Rate Last Admin  ? cyanocobalamin  ((VITAMIN B-12)) injection 1,000 mcg  1,000 mcg Intramuscular Q30 days [name], MD   1,000 mcg at 01/31/21 1507  ? ? ?Physical Exam ?BP (!) 139/47 (BP Location: Left Arm, Patient Position: Sitting, Cuff Size: Large)   Pulse (!) 57   Resp 20   Ht 5' (1.524 m)   Wt 162 lb 9.6 oz (73.8 kg)   SpO2 97% Comment: RA  BMI 31.58 kg/m?  ?81 year old woman in no acute distress ?Alert and oriented x3 ?Diminished breath sounds at left base ? ?Diagnostic Tests: ?I personally reviewed her chest x-ray from today and her CT from about 6 weeks ago.  Both show similar loculated basilar effusion, relatively small and of no clinical significance. ? ?Impression: ?Sanvi Ehler is an 81 year old woman with EGFR positive stage IV lung cancer with malignant left pleural effusion.  She had a pleural catheter placed about 4 months ago.  She is really had almost no drainage from that over the past couple of months. ? ?I recommended that we remove the pleural catheter.  She had questions about what happened if the fluid recurs.  Even if recurs is unlikely to be drained by the catheter is that area is likely scarred off at this point.  More than likely, the entire lung is relatively fixed at its current level and there will be any significant additional accumulation of fluid. ? ?She is very concerned about have the catheter removed because she saw on TV that that was more painful and having cancer in the first place.  I tried to reassure her that that would be highly unusual.  To ease her fears a little bit I think it would be best to do this and short stay with some intravenous sedation so we will get that arranged. ? ?Plan: ?Pleural catheter removal by PA and short stay with IV sedation ? ?[name], MD ?[facility] ?[phone] ? ? ? ? ?

## 2021-10-03 NOTE — H&P (View-Only) (Signed)
? ?   ?[address] ?      [name] [address] ?            [phone]   ? ?HPI: [name] returns for follow-up regarding her pleural catheter.   ? ?[name] is a an 81 year old woman with a history of hypertension, hyperlipidemia, atrial fibrillation, congestive heart failure, COPD, cor pulmonale, gout, asthma, angiodysplasia, monoclonal gammopathy, obstructive sleep apnea, and stage IV lung cancer.  She presented in October with worsening shortness of breath and had a large left pleural effusion.  She had multiple thoracenteses.  Cytology was positive for metastatic adenocarcinoma. ? ?I placed a pleural catheter in November.  We drained about 1.4 L of fluid initially. ? ?She currently is draining the catheter about every 2 weeks.  She has had minimal output the past 5 or 6 drainages.  That dates back to nearly December.  Recent chest CT showed small amount of loculated fluid at the left base. ? ?Past Medical History:  ?Diagnosis Date  ? Allergy   ? Angiodysplasia of cecum   ? Angiodysplasia of duodenum   ? Arthritis   ? Asteatotic eczema 02/25/2016  ? Asthma   ? Atrial fibrillation ([name]) 10/17/2017  ? Blood transfusion without reported diagnosis   ? Cancer [facility])   ? Cataract   ? CHF (congestive heart failure), NYHA class III, chronic, combined ([place]) 08/10/2017  ? Colonic polyp 02/16/2008  ? Tubular adenoma  ? COPD (chronic obstructive pulmonary disease) ([place]) 01/27/1998  ? Cor pulmonale ([place]) 11/16/2014  ? GERD 07/30/1992  ? Gout   ? HTN (hypertension) 07/30/1988  ? Hyperlipidemia with target LDL less than 100 11/27/1996  ?     ? Kidney stone 1960, 1972, 1991  ? MGUS (monoclonal gammopathy of unknown significance) 11/08/2016  ? Morbid obesity ([place]) 04/19/2010  ? She agrees to work on her lifestyle modifications to help her lose weight.   ? OSA (obstructive sleep apnea) 06/22/2013  ? Osteopenia, senile 02/05/2013  ? July 2014  -2.1 left femur -1.9 left forearm   ? Prediabetes 11/13/2006   ? Renal insufficiency   ? Stenosis of cervical spine with myelopathy ([place]) 01/01/2018  ? Ulcer of leg, chronic, right ([place]) 10/10/2011  ? ULCERATIVE COLITIS-LEFT SIDE 03/19/2008  ?     ? Vitamin B12 deficiency anemia 11/13/2006  ?     ? Vitamin D deficiency 02/06/2013  ? ? ?Current Outpatient Medications  ?Medication Sig Dispense Refill  ? acetaminophen (TYLENOL) 325 MG tablet Take 325-650 mg by mouth daily as needed for moderate pain, fever or headache.     ? albuterol (PROAIR HFA) 108 (90 Base) MCG/ACT inhaler Inhale 1-2 puffs into the lungs every 6 (six) hours as needed for wheezing or shortness of breath. 18 g 11  ? allopurinol (ZYLOPRIM) 100 MG tablet TAKE 1 TABLET(100 MG) BY MOUTH DAILY 90 tablet 1  ? clonazePAM (KLONOPIN) 1 MG tablet Take 1 tablet (1 mg total) by mouth 2 (two) times daily as needed for anxiety. 180 tablet 0  ? ferrous sulfate 325 (65 FE) MG tablet Take 1 tablet (325 mg total) by mouth 2 (two) times daily with a meal. 180 tablet 1  ? fluticasone furoate-vilanterol (BREO ELLIPTA) 100-25 MCG/ACT AEPB Inhale 1 puff into the lungs daily. 120 each 1  ? isosorbide-hydrALAZINE (BIDIL) 20-37.5 MG tablet TAKE 1 TABLET BY MOUTH THREE TIMES DAILY 270 tablet 0  ? metoprolol tartrate (LOPRESSOR) 25 MG tablet TAKE 1 TABLET(25 MG) BY  MOUTH TWICE DAILY (Patient taking differently: Take 12.5 mg by mouth 2 (two) times daily.) 180 tablet 1  ? morphine (MS CONTIN) 15 MG 12 hr tablet Take 1 tablet (15 mg total) by mouth 2 (two) times daily as needed for pain. 60 tablet 0  ? osimertinib mesylate (TAGRISSO) 80 MG tablet Take 1 tablet (80 mg total) by mouth daily. 30 tablet 3  ? prochlorperazine (COMPAZINE) 10 MG tablet Take 1 tablet (10 mg total) by mouth every 6 (six) hours as needed. 30 tablet 2  ? torsemide (DEMADEX) 20 MG tablet TAKE 2 TABLETS(40 MG) BY MOUTH EVERY MORNING 180 tablet 0  ? ?Current Facility-Administered Medications  ?Medication Dose Route Frequency Provider Last Rate Last Admin  ? cyanocobalamin  ((VITAMIN B-12)) injection 1,000 mcg  1,000 mcg Intramuscular Q30 days [name], MD   1,000 mcg at 01/31/21 1507  ? ? ?Physical Exam ?BP (!) 139/47 (BP Location: Left Arm, Patient Position: Sitting, Cuff Size: Large)   Pulse (!) 57   Resp 20   Ht 5' (1.524 m)   Wt 162 lb 9.6 oz (73.8 kg)   SpO2 97% Comment: RA  BMI 31.48 kg/m?  ?81 year old woman in no acute distress ?Alert and oriented x3 ?Diminished breath sounds at left base ? ?Diagnostic Tests: ?I personally reviewed her chest x-ray from today and her CT from about 6 weeks ago.  Both show similar loculated basilar effusion, relatively small and of no clinical significance. ? ?Impression: ?[name] is an 81 year old woman with EGFR positive stage IV lung cancer with malignant left pleural effusion.  She had a pleural catheter placed about 4 months ago.  She is really had almost no drainage from that over the past couple of months. ? ?I recommended that we remove the pleural catheter.  She had questions about what happened if the fluid recurs.  Even if recurs is unlikely to be drained by the catheter is that area is likely scarred off at this point.  More than likely, the entire lung is relatively fixed at its current level and there will be any significant additional accumulation of fluid. ? ?She is very concerned about have the catheter removed because she saw on TV that that was more painful and having cancer in the first place.  I tried to reassure her that that would be highly unusual.  To ease her fears a little bit I think it would be best to do this and short stay with some intravenous sedation so we will get that arranged. ? ?Plan: ?Pleural catheter removal by PA and short stay with IV sedation ? ?[name], MD ?[facility] ?[phone] ? ? ? ?

## 2021-10-05 DIAGNOSIS — J9621 Acute and chronic respiratory failure with hypoxia: Secondary | ICD-10-CM | POA: Diagnosis not present

## 2021-10-05 DIAGNOSIS — D631 Anemia in chronic kidney disease: Secondary | ICD-10-CM | POA: Diagnosis not present

## 2021-10-05 DIAGNOSIS — C3490 Malignant neoplasm of unspecified part of unspecified bronchus or lung: Secondary | ICD-10-CM | POA: Diagnosis not present

## 2021-10-05 DIAGNOSIS — E785 Hyperlipidemia, unspecified: Secondary | ICD-10-CM | POA: Diagnosis not present

## 2021-10-05 DIAGNOSIS — M19072 Primary osteoarthritis, left ankle and foot: Secondary | ICD-10-CM | POA: Diagnosis not present

## 2021-10-05 DIAGNOSIS — I700 Atherosclerosis of aorta: Secondary | ICD-10-CM | POA: Diagnosis not present

## 2021-10-05 DIAGNOSIS — G4733 Obstructive sleep apnea (adult) (pediatric): Secondary | ICD-10-CM | POA: Diagnosis not present

## 2021-10-05 DIAGNOSIS — J449 Chronic obstructive pulmonary disease, unspecified: Secondary | ICD-10-CM | POA: Diagnosis not present

## 2021-10-05 DIAGNOSIS — D519 Vitamin B12 deficiency anemia, unspecified: Secondary | ICD-10-CM | POA: Diagnosis not present

## 2021-10-05 DIAGNOSIS — D472 Monoclonal gammopathy: Secondary | ICD-10-CM | POA: Diagnosis not present

## 2021-10-05 DIAGNOSIS — M19071 Primary osteoarthritis, right ankle and foot: Secondary | ICD-10-CM | POA: Diagnosis not present

## 2021-10-05 DIAGNOSIS — Z48813 Encounter for surgical aftercare following surgery on the respiratory system: Secondary | ICD-10-CM | POA: Diagnosis not present

## 2021-10-05 DIAGNOSIS — Z433 Encounter for attention to colostomy: Secondary | ICD-10-CM | POA: Diagnosis not present

## 2021-10-05 DIAGNOSIS — I878 Other specified disorders of veins: Secondary | ICD-10-CM | POA: Diagnosis not present

## 2021-10-05 DIAGNOSIS — I2781 Cor pulmonale (chronic): Secondary | ICD-10-CM | POA: Diagnosis not present

## 2021-10-05 DIAGNOSIS — M4802 Spinal stenosis, cervical region: Secondary | ICD-10-CM | POA: Diagnosis not present

## 2021-10-05 DIAGNOSIS — I5042 Chronic combined systolic (congestive) and diastolic (congestive) heart failure: Secondary | ICD-10-CM | POA: Diagnosis not present

## 2021-10-05 DIAGNOSIS — D630 Anemia in neoplastic disease: Secondary | ICD-10-CM | POA: Diagnosis not present

## 2021-10-05 DIAGNOSIS — M1030 Gout due to renal impairment, unspecified site: Secondary | ICD-10-CM | POA: Diagnosis not present

## 2021-10-05 DIAGNOSIS — M479 Spondylosis, unspecified: Secondary | ICD-10-CM | POA: Diagnosis not present

## 2021-10-05 DIAGNOSIS — Z4801 Encounter for change or removal of surgical wound dressing: Secondary | ICD-10-CM | POA: Diagnosis not present

## 2021-10-05 DIAGNOSIS — I130 Hypertensive heart and chronic kidney disease with heart failure and stage 1 through stage 4 chronic kidney disease, or unspecified chronic kidney disease: Secondary | ICD-10-CM | POA: Diagnosis not present

## 2021-10-05 DIAGNOSIS — N1832 Chronic kidney disease, stage 3b: Secondary | ICD-10-CM | POA: Diagnosis not present

## 2021-10-05 DIAGNOSIS — H539 Unspecified visual disturbance: Secondary | ICD-10-CM | POA: Diagnosis not present

## 2021-10-05 DIAGNOSIS — M810 Age-related osteoporosis without current pathological fracture: Secondary | ICD-10-CM | POA: Diagnosis not present

## 2021-10-09 ENCOUNTER — Encounter (HOSPITAL_BASED_OUTPATIENT_CLINIC_OR_DEPARTMENT_OTHER): Payer: Self-pay | Admitting: Emergency Medicine

## 2021-10-09 ENCOUNTER — Inpatient Hospital Stay: Payer: Medicare Other | Attending: Internal Medicine

## 2021-10-09 ENCOUNTER — Inpatient Hospital Stay (HOSPITAL_BASED_OUTPATIENT_CLINIC_OR_DEPARTMENT_OTHER)
Admission: EM | Admit: 2021-10-09 | Discharge: 2021-10-11 | DRG: 378 | Disposition: A | Discharge status: Home / Self Care | Payer: Medicare Other | Attending: Student | Admitting: Student

## 2021-10-09 ENCOUNTER — Telehealth: Payer: Self-pay | Admitting: Medical Oncology

## 2021-10-09 ENCOUNTER — Other Ambulatory Visit: Payer: Self-pay

## 2021-10-09 DIAGNOSIS — Z880 Allergy status to penicillin: Secondary | ICD-10-CM

## 2021-10-09 DIAGNOSIS — K435 Parastomal hernia without obstruction or  gangrene: Secondary | ICD-10-CM | POA: Diagnosis present

## 2021-10-09 DIAGNOSIS — J449 Chronic obstructive pulmonary disease, unspecified: Secondary | ICD-10-CM | POA: Diagnosis present

## 2021-10-09 DIAGNOSIS — Z85118 Personal history of other malignant neoplasm of bronchus and lung: Secondary | ICD-10-CM | POA: Diagnosis not present

## 2021-10-09 DIAGNOSIS — N179 Acute kidney failure, unspecified: Secondary | ICD-10-CM | POA: Diagnosis present

## 2021-10-09 DIAGNOSIS — Z86010 Personal history of colonic polyps: Secondary | ICD-10-CM

## 2021-10-09 DIAGNOSIS — K432 Incisional hernia without obstruction or gangrene: Secondary | ICD-10-CM | POA: Diagnosis present

## 2021-10-09 DIAGNOSIS — D509 Iron deficiency anemia, unspecified: Secondary | ICD-10-CM | POA: Diagnosis not present

## 2021-10-09 DIAGNOSIS — E669 Obesity, unspecified: Secondary | ICD-10-CM | POA: Diagnosis present

## 2021-10-09 DIAGNOSIS — E785 Hyperlipidemia, unspecified: Secondary | ICD-10-CM | POA: Diagnosis present

## 2021-10-09 DIAGNOSIS — C771 Secondary and unspecified malignant neoplasm of intrathoracic lymph nodes: Secondary | ICD-10-CM | POA: Insufficient documentation

## 2021-10-09 DIAGNOSIS — C782 Secondary malignant neoplasm of pleura: Secondary | ICD-10-CM | POA: Insufficient documentation

## 2021-10-09 DIAGNOSIS — D500 Iron deficiency anemia secondary to blood loss (chronic): Secondary | ICD-10-CM

## 2021-10-09 DIAGNOSIS — G4733 Obstructive sleep apnea (adult) (pediatric): Secondary | ICD-10-CM | POA: Diagnosis present

## 2021-10-09 DIAGNOSIS — K5521 Angiodysplasia of colon with hemorrhage: Principal | ICD-10-CM | POA: Diagnosis present

## 2021-10-09 DIAGNOSIS — I10 Essential (primary) hypertension: Secondary | ICD-10-CM

## 2021-10-09 DIAGNOSIS — I130 Hypertensive heart and chronic kidney disease with heart failure and stage 1 through stage 4 chronic kidney disease, or unspecified chronic kidney disease: Secondary | ICD-10-CM | POA: Diagnosis not present

## 2021-10-09 DIAGNOSIS — I110 Hypertensive heart disease with heart failure: Secondary | ICD-10-CM | POA: Insufficient documentation

## 2021-10-09 DIAGNOSIS — Z8249 Family history of ischemic heart disease and other diseases of the circulatory system: Secondary | ICD-10-CM

## 2021-10-09 DIAGNOSIS — C779 Secondary and unspecified malignant neoplasm of lymph node, unspecified: Secondary | ICD-10-CM | POA: Diagnosis not present

## 2021-10-09 DIAGNOSIS — C3492 Malignant neoplasm of unspecified part of left bronchus or lung: Secondary | ICD-10-CM | POA: Insufficient documentation

## 2021-10-09 DIAGNOSIS — D649 Anemia, unspecified: Secondary | ICD-10-CM | POA: Insufficient documentation

## 2021-10-09 DIAGNOSIS — J910 Malignant pleural effusion: Secondary | ICD-10-CM | POA: Diagnosis present

## 2021-10-09 DIAGNOSIS — Z9981 Dependence on supplemental oxygen: Secondary | ICD-10-CM | POA: Diagnosis not present

## 2021-10-09 DIAGNOSIS — R0602 Shortness of breath: Secondary | ICD-10-CM | POA: Diagnosis not present

## 2021-10-09 DIAGNOSIS — C7951 Secondary malignant neoplasm of bone: Secondary | ICD-10-CM | POA: Diagnosis present

## 2021-10-09 DIAGNOSIS — D62 Acute posthemorrhagic anemia: Secondary | ICD-10-CM | POA: Diagnosis present

## 2021-10-09 DIAGNOSIS — Z800 Family history of malignant neoplasm of digestive organs: Secondary | ICD-10-CM

## 2021-10-09 DIAGNOSIS — Z20822 Contact with and (suspected) exposure to covid-19: Secondary | ICD-10-CM | POA: Diagnosis not present

## 2021-10-09 DIAGNOSIS — Z833 Family history of diabetes mellitus: Secondary | ICD-10-CM | POA: Diagnosis not present

## 2021-10-09 DIAGNOSIS — Z8051 Family history of malignant neoplasm of kidney: Secondary | ICD-10-CM

## 2021-10-09 DIAGNOSIS — J9621 Acute and chronic respiratory failure with hypoxia: Secondary | ICD-10-CM | POA: Diagnosis present

## 2021-10-09 DIAGNOSIS — R9431 Abnormal electrocardiogram [ECG] [EKG]: Secondary | ICD-10-CM | POA: Diagnosis not present

## 2021-10-09 DIAGNOSIS — Z885 Allergy status to narcotic agent status: Secondary | ICD-10-CM

## 2021-10-09 DIAGNOSIS — I5032 Chronic diastolic (congestive) heart failure: Secondary | ICD-10-CM | POA: Diagnosis present

## 2021-10-09 DIAGNOSIS — K922 Gastrointestinal hemorrhage, unspecified: Secondary | ICD-10-CM | POA: Diagnosis present

## 2021-10-09 DIAGNOSIS — M109 Gout, unspecified: Secondary | ICD-10-CM | POA: Diagnosis present

## 2021-10-09 DIAGNOSIS — Z823 Family history of stroke: Secondary | ICD-10-CM | POA: Diagnosis not present

## 2021-10-09 DIAGNOSIS — Z832 Family history of diseases of the blood and blood-forming organs and certain disorders involving the immune mechanism: Secondary | ICD-10-CM

## 2021-10-09 DIAGNOSIS — F419 Anxiety disorder, unspecified: Secondary | ICD-10-CM | POA: Diagnosis present

## 2021-10-09 DIAGNOSIS — J9611 Chronic respiratory failure with hypoxia: Secondary | ICD-10-CM | POA: Diagnosis present

## 2021-10-09 DIAGNOSIS — J90 Pleural effusion, not elsewhere classified: Secondary | ICD-10-CM | POA: Diagnosis not present

## 2021-10-09 DIAGNOSIS — C775 Secondary and unspecified malignant neoplasm of intrapelvic lymph nodes: Secondary | ICD-10-CM | POA: Insufficient documentation

## 2021-10-09 DIAGNOSIS — Z87891 Personal history of nicotine dependence: Secondary | ICD-10-CM

## 2021-10-09 DIAGNOSIS — D472 Monoclonal gammopathy: Secondary | ICD-10-CM | POA: Diagnosis not present

## 2021-10-09 DIAGNOSIS — J432 Centrilobular emphysema: Secondary | ICD-10-CM | POA: Diagnosis not present

## 2021-10-09 DIAGNOSIS — N2889 Other specified disorders of kidney and ureter: Secondary | ICD-10-CM | POA: Diagnosis not present

## 2021-10-09 DIAGNOSIS — Z801 Family history of malignant neoplasm of trachea, bronchus and lung: Secondary | ICD-10-CM

## 2021-10-09 DIAGNOSIS — I480 Paroxysmal atrial fibrillation: Secondary | ICD-10-CM | POA: Diagnosis not present

## 2021-10-09 DIAGNOSIS — C772 Secondary and unspecified malignant neoplasm of intra-abdominal lymph nodes: Secondary | ICD-10-CM | POA: Insufficient documentation

## 2021-10-09 DIAGNOSIS — Z6831 Body mass index (BMI) 31.0-31.9, adult: Secondary | ICD-10-CM

## 2021-10-09 DIAGNOSIS — I5042 Chronic combined systolic (congestive) and diastolic (congestive) heart failure: Secondary | ICD-10-CM

## 2021-10-09 DIAGNOSIS — K5190 Ulcerative colitis, unspecified, without complications: Secondary | ICD-10-CM | POA: Diagnosis present

## 2021-10-09 DIAGNOSIS — N1832 Chronic kidney disease, stage 3b: Secondary | ICD-10-CM | POA: Diagnosis not present

## 2021-10-09 DIAGNOSIS — Z933 Colostomy status: Secondary | ICD-10-CM

## 2021-10-09 DIAGNOSIS — I5033 Acute on chronic diastolic (congestive) heart failure: Secondary | ICD-10-CM | POA: Diagnosis present

## 2021-10-09 DIAGNOSIS — I509 Heart failure, unspecified: Secondary | ICD-10-CM | POA: Insufficient documentation

## 2021-10-09 DIAGNOSIS — Z9049 Acquired absence of other specified parts of digestive tract: Secondary | ICD-10-CM

## 2021-10-09 DIAGNOSIS — I700 Atherosclerosis of aorta: Secondary | ICD-10-CM | POA: Diagnosis not present

## 2021-10-09 DIAGNOSIS — K5150 Left sided colitis without complications: Secondary | ICD-10-CM | POA: Diagnosis present

## 2021-10-09 DIAGNOSIS — K921 Melena: Secondary | ICD-10-CM | POA: Diagnosis not present

## 2021-10-09 DIAGNOSIS — K436 Other and unspecified ventral hernia with obstruction, without gangrene: Secondary | ICD-10-CM | POA: Diagnosis not present

## 2021-10-09 DIAGNOSIS — Z8049 Family history of malignant neoplasm of other genital organs: Secondary | ICD-10-CM

## 2021-10-09 DIAGNOSIS — Z9109 Other allergy status, other than to drugs and biological substances: Secondary | ICD-10-CM

## 2021-10-09 LAB — CBC WITH DIFFERENTIAL/PLATELET
Abs Immature Granulocytes: 0.04 10*3/uL (ref 0.00–0.07)
Basophils Absolute: 0 10*3/uL (ref 0.0–0.1)
Basophils Relative: 0 %
Eosinophils Absolute: 0.1 10*3/uL (ref 0.0–0.5)
Eosinophils Relative: 1 %
HCT: 21.1 % — ABNORMAL LOW (ref 36.0–46.0)
Hemoglobin: 6.5 g/dL — CL (ref 12.0–15.0)
Immature Granulocytes: 1 %
Lymphocytes Relative: 11 %
Lymphs Abs: 0.9 10*3/uL (ref 0.7–4.0)
MCH: 27.1 pg (ref 26.0–34.0)
MCHC: 30.8 g/dL (ref 30.0–36.0)
MCV: 87.9 fL (ref 80.0–100.0)
Monocytes Absolute: 0.8 10*3/uL (ref 0.1–1.0)
Monocytes Relative: 9 %
Neutro Abs: 6.8 10*3/uL (ref 1.7–7.7)
Neutrophils Relative %: 78 %
Platelets: 147 10*3/uL — ABNORMAL LOW (ref 150–400)
RBC: 2.4 MIL/uL — ABNORMAL LOW (ref 3.87–5.11)
RDW: 14.8 % (ref 11.5–15.5)
WBC: 8.7 10*3/uL (ref 4.0–10.5)
nRBC: 0 % (ref 0.0–0.2)

## 2021-10-09 LAB — PREPARE RBC (CROSSMATCH)

## 2021-10-09 LAB — CBC WITH DIFFERENTIAL (CANCER CENTER ONLY)
Abs Immature Granulocytes: 0.01 10*3/uL (ref 0.00–0.07)
Basophils Absolute: 0 10*3/uL (ref 0.0–0.1)
Basophils Relative: 0 %
Eosinophils Absolute: 0.1 10*3/uL (ref 0.0–0.5)
Eosinophils Relative: 1 %
HCT: 21.6 % — ABNORMAL LOW (ref 36.0–46.0)
Hemoglobin: 6.6 g/dL — CL (ref 12.0–15.0)
Immature Granulocytes: 0 %
Lymphocytes Relative: 11 %
Lymphs Abs: 0.7 10*3/uL (ref 0.7–4.0)
MCH: 27 pg (ref 26.0–34.0)
MCHC: 30.6 g/dL (ref 30.0–36.0)
MCV: 88.5 fL (ref 80.0–100.0)
Monocytes Absolute: 0.6 10*3/uL (ref 0.1–1.0)
Monocytes Relative: 9 %
Neutro Abs: 5.1 10*3/uL (ref 1.7–7.7)
Neutrophils Relative %: 79 %
Platelet Count: 145 10*3/uL — ABNORMAL LOW (ref 150–400)
RBC: 2.44 MIL/uL — ABNORMAL LOW (ref 3.87–5.11)
RDW: 14.7 % (ref 11.5–15.5)
WBC Count: 6.5 10*3/uL (ref 4.0–10.5)
nRBC: 0 % (ref 0.0–0.2)

## 2021-10-09 LAB — PROTIME-INR
INR: 1.1 (ref 0.8–1.2)
Prothrombin Time: 13.9 seconds (ref 11.4–15.2)

## 2021-10-09 LAB — COMPREHENSIVE METABOLIC PANEL
ALT: <5 U/L (ref 0–44)
AST: 11 U/L — ABNORMAL LOW (ref 15–41)
Albumin: 3.7 g/dL (ref 3.5–5.0)
Alkaline Phosphatase: 40 U/L (ref 38–126)
Anion gap: 7 (ref 5–15)
BUN: 74 mg/dL — ABNORMAL HIGH (ref 8–23)
CO2: 28 mmol/L (ref 22–32)
Calcium: 9.8 mg/dL (ref 8.9–10.3)
Chloride: 102 mmol/L (ref 98–111)
Creatinine, Ser: 1.98 mg/dL — ABNORMAL HIGH (ref 0.44–1.00)
GFR, Estimated: 25 mL/min — ABNORMAL LOW (ref >60)
Glucose, Bld: 114 mg/dL — ABNORMAL HIGH (ref 70–99)
Potassium: 4.7 mmol/L (ref 3.5–5.1)
Sodium: 137 mmol/L (ref 135–145)
Total Bilirubin: 0.2 mg/dL — ABNORMAL LOW (ref 0.3–1.2)
Total Protein: 6.9 g/dL (ref 6.5–8.1)

## 2021-10-09 LAB — CMP (CANCER CENTER ONLY)
ALT: 5 U/L (ref 0–44)
AST: 10 U/L — ABNORMAL LOW (ref 15–41)
Albumin: 3.6 g/dL (ref 3.5–5.0)
Alkaline Phosphatase: 45 U/L (ref 38–126)
Anion gap: 7 (ref 5–15)
BUN: 76 mg/dL — ABNORMAL HIGH (ref 8–23)
CO2: 28 mmol/L (ref 22–32)
Calcium: 9.8 mg/dL (ref 8.9–10.3)
Chloride: 104 mmol/L (ref 98–111)
Creatinine: 1.98 mg/dL — ABNORMAL HIGH (ref 0.44–1.00)
GFR, Estimated: 25 mL/min — ABNORMAL LOW (ref >60)
Glucose, Bld: 151 mg/dL — ABNORMAL HIGH (ref 70–99)
Potassium: 4.2 mmol/L (ref 3.5–5.1)
Sodium: 139 mmol/L (ref 135–145)
Total Bilirubin: 0.2 mg/dL — ABNORMAL LOW (ref 0.3–1.2)
Total Protein: 6.8 g/dL (ref 6.5–8.1)

## 2021-10-09 LAB — RESP PANEL BY RT-PCR (FLU A&B, COVID) ARPGX2
Influenza A by PCR: NEGATIVE
Influenza B by PCR: NEGATIVE
SARS Coronavirus 2 by RT PCR: NEGATIVE

## 2021-10-09 MED ORDER — PANTOPRAZOLE SODIUM 40 MG IV SOLR
40.0000 mg | Freq: Two times a day (BID) | INTRAVENOUS | Status: DC
  Filled 2021-10-09 (×2): qty 10

## 2021-10-09 MED ORDER — SODIUM CHLORIDE 0.9% IV SOLUTION: Freq: Once | INTRAVENOUS | Status: AC

## 2021-10-09 MED ORDER — CLONAZEPAM 1 MG PO TABS: 0.5000 mg | ORAL_TABLET | Freq: Two times a day (BID) | ORAL | Status: DC | PRN

## 2021-10-09 MED ORDER — FLUTICASONE FUROATE-VILANTEROL 100-25 MCG/ACT IN AEPB
1.0000 | INHALATION_SPRAY | Freq: Every day | RESPIRATORY_TRACT | Status: DC
  Administered 2021-10-10 – 2021-10-11 (×2): 1 via RESPIRATORY_TRACT
  Filled 2021-10-09: qty 28

## 2021-10-09 MED ORDER — FLUTICASONE FUROATE-VILANTEROL 100-25 MCG/ACT IN AEPB: 1.0000 | INHALATION_SPRAY | Freq: Every day | RESPIRATORY_TRACT | Status: DC

## 2021-10-09 MED ORDER — ACETAMINOPHEN 325 MG PO TABS
650.0000 mg | ORAL_TABLET | Freq: Four times a day (QID) | ORAL | Status: DC | PRN
  Administered 2021-10-09 – 2021-10-10 (×2): 650 mg via ORAL
  Filled 2021-10-09 (×2): qty 2

## 2021-10-09 MED ORDER — ONDANSETRON HCL 4 MG PO TABS: 4.0000 mg | ORAL_TABLET | Freq: Four times a day (QID) | ORAL | Status: DC | PRN

## 2021-10-09 MED ORDER — FERROUS SULFATE 325 (65 FE) MG PO TABS
325.0000 mg | ORAL_TABLET | Freq: Two times a day (BID) | ORAL | Status: DC
  Administered 2021-10-10 – 2021-10-11 (×3): 325 mg via ORAL
  Filled 2021-10-09 (×3): qty 1

## 2021-10-09 MED ORDER — OSIMERTINIB MESYLATE 80 MG PO TABS
80.0000 mg | ORAL_TABLET | Freq: Every day | ORAL | Status: DC
  Administered 2021-10-11: 80 mg via ORAL

## 2021-10-09 MED ORDER — MORPHINE SULFATE ER 15 MG PO TBCR: 15.0000 mg | EXTENDED_RELEASE_TABLET | Freq: Two times a day (BID) | ORAL | Status: DC | PRN

## 2021-10-09 MED ORDER — PANTOPRAZOLE SODIUM 40 MG IV SOLR
40.0000 mg | Freq: Once | INTRAVENOUS | Status: AC
  Administered 2021-10-09: 40 mg via INTRAVENOUS
  Filled 2021-10-09: qty 10

## 2021-10-09 MED ORDER — ACETAMINOPHEN 650 MG RE SUPP
650.0000 mg | Freq: Four times a day (QID) | RECTAL | Status: DC | PRN
Start: 2021-10-09 — End: 2021-10-11

## 2021-10-09 MED ORDER — ONDANSETRON HCL 4 MG/2ML IJ SOLN: 4.0000 mg | Freq: Four times a day (QID) | INTRAMUSCULAR | Status: DC | PRN

## 2021-10-09 MED ORDER — METOPROLOL TARTRATE 25 MG PO TABS
12.5000 mg | ORAL_TABLET | Freq: Two times a day (BID) | ORAL | Status: DC
  Administered 2021-10-10 – 2021-10-11 (×2): 12.5 mg via ORAL
  Filled 2021-10-09 (×4): qty 1

## 2021-10-09 MED ORDER — ISOSORB DINITRATE-HYDRALAZINE 20-37.5 MG PO TABS
1.0000 | ORAL_TABLET | Freq: Three times a day (TID) | ORAL | Status: DC
  Administered 2021-10-10 – 2021-10-11 (×2): 1 via ORAL
  Filled 2021-10-09 (×6): qty 1

## 2021-10-09 MED ORDER — ALBUTEROL SULFATE (2.5 MG/3ML) 0.083% IN NEBU: 3.0000 mL | INHALATION_SOLUTION | Freq: Four times a day (QID) | RESPIRATORY_TRACT | Status: DC | PRN

## 2021-10-09 NOTE — ED Triage Notes (Signed)
Pt had blood drawn this morning and when she got home was called and told to come here to ER for hgb of 6.6. Pt states blood has been in her ostomy bag on and off for 2 weeks.  ?

## 2021-10-09 NOTE — ED Notes (Signed)
Report given to the floor RN.

## 2021-10-09 NOTE — Assessment & Plan Note (Signed)
Has a Pleurx catheter ?

## 2021-10-09 NOTE — Assessment & Plan Note (Signed)
Creatinine stable relative to baseline

## 2021-10-09 NOTE — Telephone Encounter (Signed)
Schedule message sent for lab and two units of blood.  ?I called pt and asked if she had any bleeding. She said "I have been bleeding for 2 weeks through my ostomy bag and last night it was pure blood in my ostomy bag.  ?Per Dr. Julien Nordmann I instructed pt and her granddaughter to go to ED. They are going to [facility]. ED charge nurse contacted. ?

## 2021-10-09 NOTE — Assessment & Plan Note (Signed)
-   Consult GI

## 2021-10-09 NOTE — ED Provider Notes (Cosign Needed)
[place] EMERGENCY DEPT Provider Note   CSN: [id number] Arrival date & time: 10/09/21  1121     History  Chief Complaint  Patient presents with   GI Bleeding    [name] is a 81 y.o. female with stage IV adenocarcinoma of the lung, ulcerative colitis, CHF, A-fib, COPD, MGUS, and colostomy secondary to incarcerated hernia status post colon resection who presents today with concern for symptomatic anemia.  Patient reports hemoglobin of 6.6 as collected at the cancer center at [facility] this morning.  She was scheduled for a blood transfusion at 1145 today at [facility], however when she informed the oncology RN that she has had bloody output in her ostomy for the last 2 weeks they directed her to the emergency department and patient presented to our ED.  Per chart review patient follows with lower gastroenterology with Dr. Fuller Plan.  She underwent colon resection and subsequently has ostomy since January 2022 after concern for large ventral hernia with incarceration of the transverse colon resulting in likely perforation and abscess.  Patient states that for the last 2 weeks she has had gradually increasing quantities of bright red blood in her colostomy bag mixed with melanotic appearing black stool which is visible in her bag at this time.  Since that time she had worse with declining energy levels, worsening shortness of breath though she is on 2 L supplemental oxygen by nasal cannula at baseline, and increased sleep.  She is accompanied at bedside by her granddaughter. Patient with last transfusion in 07/2021.   Patient is understandably frustrated she was directed to the ED after a bridge where we do not have a blood bank and therefore are unable to administer blood transfusion.  I have personally reviewed her medical records.  She is on Tagrisso 80 mg daily as p.o. chemotherapy for her adenocarcinoma.  She does have left-sided drain for recurrent malignant  effusion in the left lung.  She is not anticoagulated.  HPI     Home Medications Prior to Admission medications   Medication Sig Start Date End Date Taking? Authorizing Provider  acetaminophen (TYLENOL) 325 MG tablet Take 325-650 mg by mouth daily as needed for moderate pain, fever or headache.     [provider]  albuterol (PROAIR HFA) 108 (90 Base) MCG/ACT inhaler Inhale 1-2 puffs into the lungs every 6 (six) hours as needed for wheezing or shortness of breath. 10/30/16   [name], MD  allopurinol (ZYLOPRIM) 100 MG tablet TAKE 1 TABLET(100 MG) BY MOUTH DAILY 07/02/21   [name], MD  clonazePAM (KLONOPIN) 1 MG tablet Take 1 tablet (1 mg total) by mouth 2 (two) times daily as needed for anxiety. 06/06/21   [name], MD  ferrous sulfate 325 (65 FE) MG tablet Take 1 tablet (325 mg total) by mouth 2 (two) times daily with a meal. 11/13/19   [name], MD  fluticasone furoate-vilanterol (BREO ELLIPTA) 100-25 MCG/ACT AEPB Inhale 1 puff into the lungs daily. 09/19/21   [name], MD  isosorbide-hydrALAZINE (BIDIL) 20-37.5 MG tablet TAKE 1 TABLET BY MOUTH THREE TIMES DAILY 05/25/21   [name], MD  metoprolol tartrate (LOPRESSOR) 25 MG tablet TAKE 1 TABLET(25 MG) BY MOUTH TWICE DAILY Patient taking differently: Take 12.5 mg by mouth 2 (two) times daily. 05/16/21   [name], MD  morphine (MS CONTIN) 15 MG 12 hr tablet Take 1 tablet (15 mg total) by mouth 2 (two) times  daily as needed for pain. 06/15/21   [name], MD  osimertinib mesylate (TAGRISSO) 80 MG tablet Take 1 tablet (80 mg total) by mouth daily. 09/05/21   Curt Bears, MD  prochlorperazine (COMPAZINE) 10 MG tablet Take 1 tablet (10 mg total) by mouth every 6 (six) hours as needed. 06/08/21   Heilingoetter, Cassandra L, PA-C  torsemide (DEMADEX) 20 MG tablet TAKE 2 TABLETS(40 MG) BY MOUTH EVERY MORNING 08/21/21   [name], MD      Allergies    Celebrex [celecoxib],  Amlodipine besylate, Enalapril, Lipitor [atorvastatin], Amoxicillin, Codeine sulfate, and Hydrocodone-acetaminophen    Review of Systems   Review of Systems  Constitutional:  Positive for activity change and fatigue. Negative for appetite change, chills, diaphoresis and fever.  HENT: Negative.    Eyes: Negative.   Respiratory:  Positive for shortness of breath.   Cardiovascular: Negative.   Gastrointestinal:  Positive for abdominal pain and blood in stool. Negative for nausea and vomiting.  Genitourinary: Negative.   Musculoskeletal: Negative.   Skin: Negative.   Neurological:  Positive for headaches. Negative for light-headedness.   Physical Exam Updated Vital Signs BP (!) 143/39 (BP Location: Left Arm)    Pulse 65    Temp 97.8 F (36.6 C)    Resp 18    Ht 5' (1.524 m)    Wt 73.8 kg    SpO2 97%    BMI 31.76 kg/m  Physical Exam Vitals and nursing note reviewed.  Constitutional:      Appearance: She is obese. She is not ill-appearing or toxic-appearing.  HENT:     Head: Normocephalic and atraumatic.     Nose: Nose normal.     Mouth/Throat:     Mouth: Mucous membranes are moist.     Pharynx: Oropharynx is clear. Uvula midline. No oropharyngeal exudate or posterior oropharyngeal erythema.     Tonsils: No tonsillar exudate.  Eyes:     General: Lids are normal. Vision grossly intact.        Right eye: No discharge.        Left eye: No discharge.     Extraocular Movements: Extraocular movements intact.     Conjunctiva/sclera: Conjunctivae normal.     Pupils: Pupils are equal, round, and reactive to light.  Neck:     Trachea: Trachea and phonation normal.     Meningeal: Brudzinski's sign and Kernig's sign absent.  Cardiovascular:     Rate and Rhythm: Normal rate and regular rhythm.     Pulses: Normal pulses.     Heart sounds: Normal heart sounds. No murmur heard. Pulmonary:     Effort: Pulmonary effort is normal. No tachypnea, bradypnea, accessory muscle usage, prolonged  expiration or respiratory distress.     Breath sounds: Normal breath sounds. No wheezing or rales.  Chest:     Chest wall: No mass, lacerations, deformity, swelling, tenderness, crepitus or edema.    Abdominal:     General: Bowel sounds are normal. There is no distension.     Palpations: Abdomen is soft.     Tenderness: There is abdominal tenderness in the periumbilical area and left lower quadrant. There is no right CVA tenderness, left CVA tenderness, guarding or rebound.     Hernia: A hernia is present. Hernia is present in the ventral area.     Comments: Large ventral hernia, ostomy just to the left of midline with melanotic appearing stool in the bag.   Musculoskeletal:  General: No deformity.     Cervical back: Normal range of motion and neck supple. No tenderness.     Right lower leg: No edema.     Left lower leg: No edema.  Lymphadenopathy:     Cervical: No cervical adenopathy.  Skin:    General: Skin is warm and dry.     Capillary Refill: Capillary refill takes less than 2 seconds.  Neurological:     General: No focal deficit present.     Mental Status: She is alert and oriented to person, place, and time. Mental status is at baseline.     GCS: GCS eye subscore is 4. GCS verbal subscore is 5. GCS motor subscore is 6.     Gait: Gait is intact.  Psychiatric:        Mood and Affect: Mood normal.    ED Results / Procedures / Treatments   Labs (all labs ordered are listed, but only abnormal results are displayed) Labs Reviewed - No data to display  EKG None  Radiology No results found.  Procedures .Critical Care Performed by: Emeline Darling, PA-C Authorized by: Emeline Darling, PA-C   Critical care provider statement:    Critical care time (minutes):  45   Critical care was time spent personally by me on the following activities:  Development of treatment plan with patient or surrogate, discussions with consultants, evaluation of patient's  response to treatment, examination of patient, obtaining history from patient or surrogate, ordering and performing treatments and interventions, ordering and review of laboratory studies, ordering and review of radiographic studies, pulse oximetry and re-evaluation of patient's condition   Medications Ordered in ED Medications - No data to display  ED Course/ Medical Decision Making/ A&P Clinical Course as of 10/09/21 1717  Mon Oct 09, 2021  1245 Consults to [facility] [place] [facility] PA Amy Trellis Paganini, who agrees with plan for medical admission to [facility].  She states their team will consult on her in the inpatient setting.  I appreciate her collaboration in the care of this patient. [RS]  1497 Consult to hospitalist Dr. Aileen Fass, who is agreeable to admitting this patient to [facility] for symptomatic anemia.  Appreciate his collaboration in the care of this patient. [RS]    Clinical Course User Index [RS] Denijah Karrer, Gypsy Balsam, PA-C                           Medical Decision Making 81 year old female presents with known low hemoglobin and shortness of breath/fatigue with bloody output in her colostomy bag.  Hypertensive on intake vital signs as well.  Cardiopulmonary exam is normal, abdominal exam with large ventral hernia and melanotic appearing output in her colostomy bag.  Bilateral lower extremity edema per patient at her baseline.  Amount and/or Complexity of Data Reviewed Labs: ordered.    Details: CBC with Significant anemia with hemoglobin of 6.5 without leukocytosis.  CMP with elevated creatinine to 1.98 at patient's baseline.  Coags are normal. Discussion of management or test interpretation with external provider(s): Consults to GI and hospitalist as above.   Risk Prescription drug management. Decision regarding hospitalization.    Given patient's symptomatology and ongoing apparent blood loss through her colostomy, do feel she would benefit from  mission to the hospital for symptomatic anemia.  Sandeep and her granddaughter voiced understanding for medical evaluation and treatment plan.  Each of their questions answered to their expressed satisfaction.  Patient is  to be transferred to [facility] for admission and blood transfusion.   This chart was dictated using voice recognition software, Dragon. Despite the best efforts of this provider to proofread and correct errors, errors may still occur which can change documentation meaning.  Final Clinical Impression(s) / ED Diagnoses Final diagnoses:  None    Rx / DC Orders ED Discharge Orders     None         Emeline Darling, PA-C 10/09/21 1720

## 2021-10-09 NOTE — Assessment & Plan Note (Signed)
-   Continue iron ?

## 2021-10-09 NOTE — Assessment & Plan Note (Addendum)
Continue home oxygen 

## 2021-10-09 NOTE — Hospital Course (Signed)
[name] is an 81 y.o. F with NSCLC widely metastatic to lymph nodes, left pleura, and bone on Tagrisso, recurrent GIB due to AVMs, CKD IIIb baseline 1.7, hx ulcerative colitis, hx asthma/COPD/pHTN/dCHF and chronic respiratory failure on 2L home O2, MGUS, OSA, pAF not on AC and recurrent malignant effusion with PleurX as well as hx incarcerated hernia s/p colectomy with colostomy since Jan 2022 who presented with hematochezia and melena.  Patient is occasional blood or melena in her colostomy bag.  She has chronic iron deficiency as a result of this (presumably bleeding AVMs), and receives periodic transfusions from her oncologist.  In the last 2 weeks, she has seen an increase in the rate of bleeding and melena in her bag.  She has had some dizziness and lightheadedness.  She has had some occasional cramping in her belly.  This morning she had routine lab work that showed a hemoglobin of 6.6, when she called her oncologist to let them know she was having hematochezia they recommended she go to the ER.  In the ER, hemoglobin 6.5.  WBC normal.  Creatinine 1.9 from baseline 1.7.

## 2021-10-09 NOTE — Assessment & Plan Note (Addendum)
Seems to have resolved.  CT abdomen and pelvis without acute finding.  GI and general surgery signed off.  H&H remained stable after 2 units of transfusion. -Recheck CBC at follow-up

## 2021-10-09 NOTE — Assessment & Plan Note (Addendum)
-  Continue Tagrisso -Outpatient follow-up with oncology

## 2021-10-09 NOTE — Assessment & Plan Note (Signed)
-   Continue ICS/lab

## 2021-10-09 NOTE — Assessment & Plan Note (Addendum)
Not on anticoagulation due to history of GI bleed. -Continue metoprolol

## 2021-10-09 NOTE — Assessment & Plan Note (Addendum)
Patient's baseline hemoglobin is around 8.   - Transfuse 1 unit now - Check posttransfusion H&H -Continue iron

## 2021-10-09 NOTE — Assessment & Plan Note (Addendum)
-   Hold torsemide until hemodynamics clear -Continue Lopressor and bidil

## 2021-10-09 NOTE — ED Notes (Signed)
Report was given to [facility]. ?

## 2021-10-09 NOTE — Progress Notes (Signed)
81 year old with past medical history of stage IV adenocarcinoma of the lung, history of ulcerative colitis, paroxysmal atrial fibrillation not on anticoagulation, MGUS, with a colostomy secondary to incarcerated hernia status post colon resection about a year ago comes in for symptomatic anemia, she relates that for about a week she has been having bright red blood per rectum and just this morning turned melanotic in the ED she was found to have a hemoglobin of 6.6 she is going to be transfused GI has been consulted  will see her in consultation upon arriving to [facility]. ?

## 2021-10-09 NOTE — ED Notes (Signed)
RT informed by Medic that patient was placed on oxygen by PA. Spoke with patient and she endorses she wears 2 liters of oxygen continuously. Patient was not on oxygen at arrival to department. Patient vitals were updated at the time of assessment and no further needs at that time.  ?

## 2021-10-09 NOTE — Progress Notes (Signed)
Patient ID: [name], female   DOB: Sep 15, 1940, 81 y.o.   MRN: [id number] ? ? ?[facility] ? ?Spoke with ER MD , we are aware of consult and will plan to see pt in am ,as she is still waiting for transfer  to hospital. ?Please call MD on call for any acute issues. ?

## 2021-10-09 NOTE — Assessment & Plan Note (Addendum)
-   CPAP at night

## 2021-10-09 NOTE — H&P (Signed)
History and Physical    Patient: [name] GBT:[id number] DOB: 01/30/1941 DOA: 10/09/2021 DOS: the patient was seen and examined on 10/09/2021 PCP: [name], MD  Patient coming from: Home  Chief Complaint:  Chief Complaint  Patient presents with   GI Bleeding   HPI:  [name] is an 81 y.o. F with NSCLC widely metastatic to lymph nodes, left pleura, and bone on Tagrisso, recurrent GIB due to AVMs, CKD IIIb baseline 1.7, hx ulcerative colitis, hx asthma/COPD/pHTN/dCHF and chronic respiratory failure on 2L home O2, MGUS, OSA, pAF not on AC and recurrent malignant effusion with PleurX as well as hx incarcerated hernia s/p colectomy with colostomy since Jan 2022 who presented with hematochezia and melena.  Patient is occasional blood or melena in her colostomy bag.  She has chronic iron deficiency as a result of this (presumably bleeding AVMs), and receives periodic transfusions from her oncologist.  In the last 2 weeks, she has seen an increase in the rate of bleeding and melena in her bag.  She has had some dizziness and lightheadedness.  She has had some occasional cramping in her belly.  This morning she had routine lab work that showed a hemoglobin of 6.6, when she called her oncologist to let them know she was having hematochezia they recommended she go to the ER.  In the ER, hemoglobin 6.5.  WBC normal.  Creatinine 1.9 from baseline 1.7.       Review of Systems: Review of Systems  Gastrointestinal:  Positive for abdominal pain, blood in stool and melena. Negative for nausea and vomiting.  Neurological:  Positive for dizziness and weakness.  All other systems reviewed and are negative.      Past Medical History:  Diagnosis Date   Allergy    Angiodysplasia of cecum    Angiodysplasia of duodenum    Arthritis    Asteatotic eczema 02/25/2016   Asthma    Atrial fibrillation ([place]) 10/17/2017   Blood transfusion without reported diagnosis    Cancer ([place])     Cataract    CHF (congestive heart failure), NYHA class III, chronic, combined ([place]) 08/10/2017   Colonic polyp 02/16/2008   Tubular adenoma   COPD (chronic obstructive pulmonary disease) ([place]) 01/27/1998   Cor pulmonale ([place]) 11/16/2014   GERD 07/30/1992   Gout    HTN (hypertension) 07/30/1988   Hyperlipidemia with target LDL less than 100 11/27/1996        Kidney stone 1960, 1972, 1991   MGUS (monoclonal gammopathy of unknown significance) 11/08/2016   Morbid obesity ([place]) 04/19/2010   She agrees to work on her lifestyle modifications to help her lose weight.    OSA (obstructive sleep apnea) 06/22/2013   Osteopenia, senile 02/05/2013   July 2014  -2.1 left femur -1.9 left forearm    Prediabetes 11/13/2006   Renal insufficiency    Stenosis of cervical spine with myelopathy ([place]) 01/01/2018   Ulcer of leg, chronic, right ([place]) 10/10/2011   ULCERATIVE COLITIS-LEFT SIDE 03/19/2008        Vitamin B12 deficiency anemia 11/13/2006        Vitamin D deficiency 02/06/2013   Past Surgical History:  Procedure Laterality Date   BRONCHOSCOPY     CHEST TUBE INSERTION Left 06/15/2021   Procedure: INSERTION PLEURAL DRAINAGE CATHETER;  Surgeon: [name], MD;  Location: [facility] [facility];  Service: Thoracic;  Laterality: Left;   COLONOSCOPY     COLONOSCOPY WITH PROPOFOL N/A 02/23/2015   Procedure: COLONOSCOPY WITH PROPOFOL;  Surgeon: Ladene Artist, MD;  Location[facility];  Service: Endoscopy;  Laterality: N/A;   COLONOSCOPY WITH PROPOFOL N/A 08/12/2018   Procedure: COLONOSCOPY WITH PROPOFOL;  Surgeon: Ladene Artist, MD;  [facility];  Service: Endoscopy;  Laterality: N/A;   COLONOSCOPY WITH PROPOFOL N/A 05/09/2020   Procedure: COLONOSCOPY WITH PROPOFOL;  Surgeon: Ladene Artist, MD;  [facility];  Service: Endoscopy;  Laterality: N/A;  To Splenic Flexture   ESOPHAGOGASTRODUODENOSCOPY (EGD) WITH PROPOFOL N/A 02/23/2015   Procedure: ESOPHAGOGASTRODUODENOSCOPY  (EGD) WITH PROPOFOL;  Surgeon: Ladene Artist, MD;  [facility];  Service: Endoscopy;  Laterality: N/A;   ESOPHAGOGASTRODUODENOSCOPY (EGD) WITH PROPOFOL N/A 08/12/2018   Procedure: ESOPHAGOGASTRODUODENOSCOPY (EGD) WITH PROPOFOL;  Surgeon: Ladene Artist, MD;  [facility];  Service: Endoscopy;  Laterality: N/A;   ESOPHAGOGASTRODUODENOSCOPY (EGD) WITH PROPOFOL N/A 05/08/2020   Procedure: ESOPHAGOGASTRODUODENOSCOPY (EGD) WITH PROPOFOL;  Surgeon: Yetta Flock, MD;  [facility];  Service: Gastroenterology;  Laterality: N/A;   HOT HEMOSTASIS N/A 08/12/2018   Procedure: HOT HEMOSTASIS (ARGON PLASMA COAGULATION/BICAP);  Surgeon: Ladene Artist, MD;  Location: [facility];  Service: Endoscopy;  Laterality: N/A;  EGD and Colon APC   HOT HEMOSTASIS N/A 05/08/2020   Procedure: HOT HEMOSTASIS (ARGON PLASMA COAGULATION/BICAP);  Surgeon: Yetta Flock, MD;  Location: [facility];  Service: Gastroenterology;  Laterality: N/A;   LAPAROTOMY N/A 08/11/2020   Procedure: EXPLORATORY LAPAROTOMY PARTIAL COLECTOMY AND COLOSTOMY WITH RESECTION OF A MESSENTERIC MASS;  Surgeon: Coralie Keens, MD;  [facility];  Service: General;  Laterality: N/A;   POLYPECTOMY     Social History:  reports that she quit smoking about 25 years ago. Her smoking use included cigarettes. She has never used smokeless tobacco. She reports that she does not drink alcohol and does not use drugs.  Allergies  Allergen Reactions   Celebrex [Celecoxib] Swelling and Other (See Comments)    Ankles swell   Amlodipine Besylate Swelling and Other (See Comments)    Ankles swell   Enalapril Cough   Lipitor [Atorvastatin]     Muscle aches   Trelegy Ellipta [Fluticasone-Umeclidin-Vilant] Other (See Comments)    Made the throat and esophagus "hurt"   Amoxicillin Diarrhea    DID THE REACTION INVOLVE: Swelling of the face/tongue/throat, SOB, or low BP? N Sudden or severe rash/hives, skin peeling,  or the inside of the mouth or nose? N Did it require medical treatment? N When did it last happen?      MORE THAN 10 YEARS If all above answers are "NO", may proceed with cephalosporin use.    Codeine Sulfate Nausea Only   Hydrocodone-Acetaminophen Nausea Only   Tape Itching, Rash and Other (See Comments)    Redness, also    Family History  Problem Relation Age of Onset   Stroke Mother    Diabetes Mother    Kidney cancer Mother    Stomach cancer Mother    Heart disease Mother    Stroke Father    Heart disease Brother    Heart disease Brother    Crohn's disease Brother    Heart disease Sister        CATH,STENT   Clotting disorder Sister    Cervical cancer Sister    Lung cancer Sister    Heart attack Sister    Diabetes Sister    Colon cancer Neg Hx    Esophageal cancer Neg Hx    Rectal cancer Neg Hx     Prior  to Admission medications   Medication Sig Start Date End Date Taking? Authorizing Provider  acetaminophen (TYLENOL) 325 MG tablet Take 325-650 mg by mouth daily as needed for moderate pain, fever or headache.    Yes [provider]  albuterol (PROAIR HFA) 108 (90 Base) MCG/ACT inhaler Inhale 1-2 puffs into the lungs every 6 (six) hours as needed for wheezing or shortness of breath. 10/30/16  Yes [name], MD  allopurinol (ZYLOPRIM) 100 MG tablet TAKE 1 TABLET(100 MG) BY MOUTH DAILY Patient taking differently: Take 100 mg by mouth daily. 07/02/21  Yes [name], MD  clonazePAM (KLONOPIN) 1 MG tablet Take 1 tablet (1 mg total) by mouth 2 (two) times daily as needed for anxiety. Patient taking differently: Take 0.5-1 mg by mouth 2 (two) times daily as needed for anxiety. 06/06/21  Yes [name], MD  ferrous sulfate 325 (65 FE) MG tablet Take 1 tablet (325 mg total) by mouth 2 (two) times daily with a meal. 11/13/19  Yes [name], MD  fluticasone furoate-vilanterol (BREO ELLIPTA) 100-25 MCG/ACT AEPB Inhale 1 puff into the lungs daily. 09/19/21   Yes [name], MD  isosorbide-hydrALAZINE (BIDIL) 20-37.5 MG tablet TAKE 1 TABLET BY MOUTH THREE TIMES DAILY Patient taking differently: Take 1 tablet by mouth in the morning and at bedtime. 05/25/21  Yes [name], MD  metoprolol tartrate (LOPRESSOR) 25 MG tablet TAKE 1 TABLET(25 MG) BY MOUTH TWICE DAILY Patient taking differently: Take 25 mg by mouth in the morning and at bedtime. 05/16/21  Yes [name], MD  morphine (MS CONTIN) 15 MG 12 hr tablet Take 1 tablet (15 mg total) by mouth 2 (two) times daily as needed for pain. 06/15/21  Yes [name], MD  Nutritional Supplements (CARNATION BREAKFAST ESSENTIALS) LIQD Take 237 mLs by mouth daily.   Yes [provider]  osimertinib mesylate (TAGRISSO) 80 MG tablet Take 1 tablet (80 mg total) by mouth daily. 09/05/21  Yes Curt Bears, MD  OXYGEN Inhale 2 L/min into the lungs continuous.   Yes [provider]  prochlorperazine (COMPAZINE) 10 MG tablet Take 1 tablet (10 mg total) by mouth every 6 (six) hours as needed. Patient taking differently: Take 10 mg by mouth every 6 (six) hours as needed for nausea or vomiting. 06/08/21  Yes Heilingoetter, Cassandra L, PA-C  protein supplement shake (PREMIER PROTEIN) LIQD Take 11 oz by mouth 2 (two) times daily between meals.   Yes [provider]  torsemide (DEMADEX) 20 MG tablet TAKE 2 TABLETS(40 MG) BY MOUTH EVERY MORNING Patient taking differently: Take 40 mg by mouth in the morning. 08/21/21  Yes [name], MD    Physical Exam: Vitals:   10/09/21 1347 10/09/21 1430 10/09/21 1500 10/09/21 1705  BP: (!) 144/38 (!) 139/46 (!) 138/45 (!) 176/51  Pulse: 65 61 61 64  Resp: 16 17 15 18   Temp:    97.8 F (36.6 C)  TempSrc:    Oral  SpO2: 100% 100% 100% 100%  Weight:      Height:       Elderly obese female, lying in bed, appears chronically ill. Anicteric, conjunctival pale, lids and lashes normal. No nasal deformity, discharge, or  epistaxis, nasal cannula in place, oropharynx moist, dentures in place RRR, systolic murmur, best heard over S2, near the sternal border, radiating to the carotids, no peripheral edema, JVP not visible Normal respiratory rate and rhythm, lungs clear without rales or wheezes Abdomen  with diffuse tenderness, no rebound, no rigidity, she has a large hernia, she has an ostomy with scant amount of darkish reddish liquid diet. Attention normal, affect appropriate, judgment insight appear normal.      Data Reviewed: I have Reviewed nursing notes, Vitals, and Lab results since pt's last encounter. Pertinent lab results include hemoglobin 6.5, negative COVID test, INR normal, creatinine 1.98 from baseline 1.7, otherwise normal electrolytes I have ordered test including repeat hemoglobin I have reviewed the last note from oncology,         Assessment and Plan: * Gastrointestinal hemorrhage Suspect this is more AVM than ulcerative colitis, but defer this to GI.  She has only some dark watery fluid in the bag, and is hemodynamically stable - Start IV pantoprazole - Clears only - Consult gastroenterology     Symptomatic anemia Patient's baseline hemoglobin is around 8.   - Transfuse 1 unit now - Check posttransfusion H&H -Continue iron  Adenocarcinoma of left lung, stage 4 (HCC) - Continue Tagrisso  Recurrent pleural effusion Has a Pleurx catheter  COPD (chronic obstructive pulmonary disease) (HCC) - Continue ICS/lab  Chronic respiratory failure with hypoxia (HCC) - Continue home oxygen  AVM (arteriovenous malformation) of small bowel, acquired with hemorrhage - Consult GI  Paroxysmal atrial fibrillation ([place]) Not on blood thinner due to chronic bleeding.  Heart rate controlled - Continue metoprolol  CHF (congestive heart failure), NYHA class III, chronic, diastolic (HCC) - Hold torsemide until hemodynamics clear -Continue Lopressor and bidil  Anemia, iron  deficiency - Continue iron  OSA (obstructive sleep apnea) - CPAP at night  Chronic kidney disease, stage 3b (HCC) Creatinine stable relative to baseline  Morbid obesity (HCC) BMI 31      Advance Care Planning:   Code Status: Full Code confirmed with patient and granddaughter  Consults: Gastroenterology, Ages  Family Communication: Granddaughter  Severity of Illness: The appropriate patient status for this patient is OBSERVATION. Observation status is judged to be reasonable and necessary in order to provide the required intensity of service to ensure the patient's safety. The patient's presenting symptoms, physical exam findings, and initial radiographic and laboratory data in the context of their medical condition is felt to place them at decreased risk for further clinical deterioration. Furthermore, it is anticipated that the patient will be medically stable for discharge from the hospital within 2 midnights of admission.   Author: Edwin Dada, MD 10/09/2021 7:37 PM  For on call review [email or web address].

## 2021-10-09 NOTE — ED Notes (Signed)
Provider discontinued the Orthostatic V/S. ?

## 2021-10-09 NOTE — Progress Notes (Signed)
Pt refused nocturnal cpap.  Pt was advised that RT is available all night should she change her mind. ?

## 2021-10-09 NOTE — Assessment & Plan Note (Signed)
BMI 31

## 2021-10-10 ENCOUNTER — Encounter (HOSPITAL_COMMUNITY): Payer: Self-pay | Admitting: Internal Medicine

## 2021-10-10 ENCOUNTER — Telehealth: Payer: Self-pay

## 2021-10-10 ENCOUNTER — Inpatient Hospital Stay (HOSPITAL_COMMUNITY): Payer: Medicare Other

## 2021-10-10 DIAGNOSIS — K5521 Angiodysplasia of colon with hemorrhage: Secondary | ICD-10-CM | POA: Diagnosis not present

## 2021-10-10 DIAGNOSIS — C3492 Malignant neoplasm of unspecified part of left bronchus or lung: Secondary | ICD-10-CM | POA: Diagnosis not present

## 2021-10-10 DIAGNOSIS — K922 Gastrointestinal hemorrhage, unspecified: Secondary | ICD-10-CM | POA: Diagnosis not present

## 2021-10-10 DIAGNOSIS — D62 Acute posthemorrhagic anemia: Secondary | ICD-10-CM

## 2021-10-10 DIAGNOSIS — D500 Iron deficiency anemia secondary to blood loss (chronic): Secondary | ICD-10-CM | POA: Diagnosis not present

## 2021-10-10 LAB — BASIC METABOLIC PANEL
Anion gap: 7 (ref 5–15)
BUN: 72 mg/dL — ABNORMAL HIGH (ref 8–23)
CO2: 26 mmol/L (ref 22–32)
Calcium: 9.1 mg/dL (ref 8.9–10.3)
Chloride: 101 mmol/L (ref 98–111)
Creatinine, Ser: 2.15 mg/dL — ABNORMAL HIGH (ref 0.44–1.00)
GFR, Estimated: 23 mL/min — ABNORMAL LOW (ref >60)
Glucose, Bld: 91 mg/dL (ref 70–99)
Potassium: 3.8 mmol/L (ref 3.5–5.1)
Sodium: 134 mmol/L — ABNORMAL LOW (ref 135–145)

## 2021-10-10 LAB — PREPARE RBC (CROSSMATCH)

## 2021-10-10 LAB — CBC
HCT: 22.9 % — ABNORMAL LOW (ref 36.0–46.0)
Hemoglobin: 7.1 g/dL — ABNORMAL LOW (ref 12.0–15.0)
MCH: 26.1 pg (ref 26.0–34.0)
MCHC: 31 g/dL (ref 30.0–36.0)
MCV: 84.2 fL (ref 80.0–100.0)
Platelets: 116 10*3/uL — ABNORMAL LOW (ref 150–400)
RBC: 2.72 MIL/uL — ABNORMAL LOW (ref 3.87–5.11)
RDW: 19.9 % — ABNORMAL HIGH (ref 11.5–15.5)
WBC: 5.8 10*3/uL (ref 4.0–10.5)
nRBC: 0 % (ref 0.0–0.2)

## 2021-10-10 LAB — HEMOGLOBIN AND HEMATOCRIT, BLOOD
HCT: 27.9 % — ABNORMAL LOW (ref 36.0–46.0)
Hemoglobin: 8.6 g/dL — ABNORMAL LOW (ref 12.0–15.0)

## 2021-10-10 MED ORDER — IOHEXOL 9 MG/ML PO SOLN
500.0000 mL | ORAL | Status: AC
  Administered 2021-10-10: 500 mL via ORAL

## 2021-10-10 MED ORDER — SODIUM CHLORIDE 0.9% IV SOLUTION: Freq: Once | INTRAVENOUS | Status: AC

## 2021-10-10 MED ORDER — IOHEXOL 9 MG/ML PO SOLN
ORAL | Status: AC
  Administered 2021-10-10: 500 mL
  Filled 2021-10-10: qty 1000

## 2021-10-10 NOTE — Progress Notes (Signed)
[facility]) Hospital Liaison note: ? ?This patient is currently enrolled in [facility] outpatient-based Palliative Care. Will continue to follow for disposition. ? ?Please call with any outpatient palliative questions or concerns. ? ?Thank you, ?Lorelee Market, LPN ?[facility] Hospital Liaison ?[phone] ?

## 2021-10-10 NOTE — Consult Note (Addendum)
? ? ?Consultation ? ?Referring Provider:  [facility]/ Danford MD ?Primary Care Physician:  [name], MD ?Primary Gastroenterologist:  Dr.Stark ? ?Reason for Consultation:  bleeding via ostomy, anemia ? ?HPI: [name] is a 81 y.o. female with multiple significant comorbidities.  She is currently being treated for stage IV non-small cell lung cancer/adenocarcinoma with previously documented widespread metastatic disease, and chronic left pleural effusion for which she has required Pleurx catheter.  She was initially diagnosed in October 2022 and is been on treatment with Tagrisso orally.  At the time of her most recent staging imaging and per notes she has shown response to therapy. ?She did undergo CT of the chest abdomen pelvis in January 2023 showed a one 4.1 x 3.3 cm mass in the right kidney concerning for renal cell carcinoma, also with 2 large ventral hernias containing portions of colon/appendix and distal small bowel.  Also could not exclude a cecal lesion with area of concern measuring 2.8 x 2.6 cm however this was not seen on PET scan as of 08/10/2021.  It was noted that she had decrease in right pleural effusion. ?She also has history of MGUS, history of ulcerative colitis which has been inactive, congestive heart failure, COPD on chronic oxygen at 2 L and prior history of GI bleeding felt secondary to AVMs. ?She had undergone EGD most recently in October 2021 for iron deficiency anemia found to have 3 diminutive AVMs in the second portion of the duodenum which were treated with APC and 3 diminutive AVMs in the duodenal bulb also treated with APC. ?Colonoscopy was attempted October 2021 noted to have multiple diverticuli in the left colon and there was a severe extrinsic stenosis in the transverse colon from a nonreducible periumbilical hernia, unable to traverse beyond this area. ?She was subsequently seen by surgery and ultimately underwent an exploratory lap resection of the transverse  colon/partial colectomy and end colostomy January 2022.  There was concern for "mesenteric mass" at the time of surgery, path showed benign tissue. ?She had also had colonoscopy in 2016 at that time had 4 cecal AVMs noted, nonbleeding and not treated and had one 6 mm tubular adenoma removed. ? ?Patient is currently not on any aspirin or blood thinners. ?She did require 2 unit transfusion in January 2023 for drift in her hemoglobin to the 6.8 range. ?She says over the past couple of weeks she had been noticing red blood mixed with stool intermittently from her colostomy.  On Sunday evening 10/08/2021 she had an episode with bad abdominal cramping and pain followed by a larger amount of red blood per the ostomy without any stool.  She says she has been uncomfortable in her abdomen particularly over the past week and has been having episodes of fairly intense abdominal cramping followed by varying amounts of blood in the ostomy. ?No ongoing melena but says off-and-on over the past month or 2 she had been noticing some dark stool. ? ?She presented to the emergency room at as directed by oncology for hemoglobin of 6.6 yesterday says she been feeling very fatigued and more short of breath.  Hemoglobin down from 8.6 after she been transfused in February. ?She was given 1 unit of packed RBCs and hemoglobin up to 7.1, to receive 1 more unit today. ? ? ?Past Medical History:  ?Diagnosis Date  ? Allergy   ? Angiodysplasia of cecum   ? Angiodysplasia of duodenum   ? Arthritis   ? Asteatotic eczema 02/25/2016  ? Asthma   ?  Atrial fibrillation ([place]) 10/17/2017  ? Blood transfusion without reported diagnosis   ? Cancer [facility])   ? Cataract   ? CHF (congestive heart failure), NYHA class III, chronic, combined ([place]) 08/10/2017  ? Colonic polyp 02/16/2008  ? Tubular adenoma  ? COPD (chronic obstructive pulmonary disease) ([place]) 01/27/1998  ? Cor pulmonale ([place]) 11/16/2014  ? GERD 07/30/1992  ? Gout   ? HTN (hypertension) 07/30/1988  ?  Hyperlipidemia with target LDL less than 100 11/27/1996  ?     ? Kidney stone 1960, 1972, 1991  ? MGUS (monoclonal gammopathy of unknown significance) 11/08/2016  ? Morbid obesity ([place]) 04/19/2010  ? She agrees to work on her lifestyle modifications to help her lose weight.   ? OSA (obstructive sleep apnea) 06/22/2013  ? Osteopenia, senile 02/05/2013  ? July 2014  -2.1 left femur -1.9 left forearm   ? Prediabetes 11/13/2006  ? Renal insufficiency   ? Stenosis of cervical spine with myelopathy ([place]) 01/01/2018  ? Ulcer of leg, chronic, right ([place]) 10/10/2011  ? ULCERATIVE COLITIS-LEFT SIDE 03/19/2008  ?     ? Vitamin B12 deficiency anemia 11/13/2006  ?     ? Vitamin D deficiency 02/06/2013  ? ? ?Past Surgical History:  ?Procedure Laterality Date  ? BRONCHOSCOPY    ? CHEST TUBE INSERTION Left 06/15/2021  ? Procedure: INSERTION PLEURAL DRAINAGE CATHETER;  Surgeon: [name], MD;  Location: [place];  Service: Thoracic;  Laterality: Left;  ? COLONOSCOPY    ? COLONOSCOPY WITH PROPOFOL N/A 02/23/2015  ? Procedure: COLONOSCOPY WITH PROPOFOL;  Surgeon: Ladene Artist, MD;  [facility];  Service: Endoscopy;  Laterality: N/A;  ? COLONOSCOPY WITH PROPOFOL N/A 08/12/2018  ? Procedure: COLONOSCOPY WITH PROPOFOL;  Surgeon: Ladene Artist, MD;  [facility];  Service: Endoscopy;  Laterality: N/A;  ? COLONOSCOPY WITH PROPOFOL N/A 05/09/2020  ? Procedure: COLONOSCOPY WITH PROPOFOL;  Surgeon: Ladene Artist, MD;  [facility];  Service: Endoscopy;  Laterality: N/A;  To Splenic Flexture  ? ESOPHAGOGASTRODUODENOSCOPY (EGD) WITH PROPOFOL N/A 02/23/2015  ? Procedure: ESOPHAGOGASTRODUODENOSCOPY (EGD) WITH PROPOFOL;  Surgeon: Ladene Artist, MD;  [facility];  Service: Endoscopy;  Laterality: N/A;  ? ESOPHAGOGASTRODUODENOSCOPY (EGD) WITH PROPOFOL N/A 08/12/2018  ? Procedure: ESOPHAGOGASTRODUODENOSCOPY (EGD) WITH PROPOFOL;  Surgeon: Ladene Artist, MD;  [facility];  Service:  Endoscopy;  Laterality: N/A;  ? ESOPHAGOGASTRODUODENOSCOPY (EGD) WITH PROPOFOL N/A 05/08/2020  ? Procedure: ESOPHAGOGASTRODUODENOSCOPY (EGD) WITH PROPOFOL;  Surgeon: Yetta Flock, MD;  [facility];  Service: Gastroenterology;  Laterality: N/A;  ? HOT HEMOSTASIS N/A 08/12/2018  ? Procedure: HOT HEMOSTASIS (ARGON PLASMA COAGULATION/BICAP);  Surgeon: Ladene Artist, MD;  Location: [facility];  Service: Endoscopy;  Laterality: N/A;  EGD and Colon APC  ? HOT HEMOSTASIS N/A 05/08/2020  ? Procedure: HOT HEMOSTASIS (ARGON PLASMA COAGULATION/BICAP);  Surgeon: Yetta Flock, MD;  Location: [facility];  Service: Gastroenterology;  Laterality: N/A;  ? LAPAROTOMY N/A 08/11/2020  ? Procedure: EXPLORATORY LAPAROTOMY PARTIAL COLECTOMY AND COLOSTOMY WITH RESECTION OF A MESSENTERIC MASS;  Surgeon: Coralie Keens, MD;  [facility];  Service: General;  Laterality: N/A;  ? POLYPECTOMY    ? ? ?Prior to Admission medications   ?Medication Sig Start Date End Date Taking? Authorizing Provider  ?acetaminophen (TYLENOL) 325 MG tablet Take 325-650 mg by mouth daily as needed for moderate pain, fever or headache.    Yes [provider]  ?albuterol (PROAIR HFA) 108 (90 Base) MCG/ACT inhaler  Inhale 1-2 puffs into the lungs every 6 (six) hours as needed for wheezing or shortness of breath. 10/30/16  Yes [name], MD  ?allopurinol (ZYLOPRIM) 100 MG tablet TAKE 1 TABLET(100 MG) BY MOUTH DAILY ?Patient taking differently: Take 100 mg by mouth daily. 07/02/21  Yes [name], MD  ?clonazePAM (KLONOPIN) 1 MG tablet Take 1 tablet (1 mg total) by mouth 2 (two) times daily as needed for anxiety. ?Patient taking differently: Take 0.5-1 mg by mouth 2 (two) times daily as needed for anxiety. 06/06/21  Yes [name], MD  ?ferrous sulfate 325 (65 FE) MG tablet Take 1 tablet (325 mg total) by mouth 2 (two) times daily with a meal. 11/13/19  Yes [name], MD  ?fluticasone furoate-vilanterol (BREO  ELLIPTA) 100-25 MCG/ACT AEPB Inhale 1 puff into the lungs daily. 09/19/21  Yes [name], MD  ?isosorbide-hydrALAZINE (BIDIL) 20-37.5 MG tablet TAKE 1 TABLET BY MOUTH THREE TIMES DAILY ?Patient taking diffe

## 2021-10-10 NOTE — Consult Note (Signed)
? ? ? ? ?Consult Note ? ?[name] ?20-Apr-1941  ?[id number].   ? ?Requesting MD: Myrene Buddy, MD ?Chief Complaint/Reason for Consult: GI bleeding and chronically incarcerated ventral hernias  ?HPI:  ?Patient is an 81 year old female who is s/p partial colectomy and end colostomy 08/11/20 for incarcerated ventral hernia with transverse colon stricture and mesenteric mass. Pathology from this was benign. She also has a history of intestinal AVMs with occasional GI bleeding and Hx of UC. Patient reports that over the last 2 weeks she has had a few episodes of blood from stoma. She reports she initially had some frank blood in bag followed by decreased output for a few days. She then had large volume stool from stoma. She had another bloody episode of BM and decreased output. Today she is having dark stool from stoma. Denies nausea or vomiting. She was eating at home but had little appetite. She notes that hernias sometimes feel firm but are currently soft. They have gradually enlarged since initial surgery in January of 2022. She reports that at this time she would really prefer not to have another abdominal surgery given multiple co-morbidities including metastatic lung cancer.  ? ?PMH otherwise significant for NSCLC with mets to bone and left pleura and lymph nodes, COPD, chronic respiratory failure on home O2, recurrent malignant pleural effusion with Pleurex catheter in place, CKD stage III, CHF, HTN, HLD, MGUS, gout, paroxysmal A. Fib, OSA and obesity. No other previous abdominal surgery. She is not on blood thinners given chronic GI bleeding. She quit smoking in the late 1990s and denies alcohol or illicit drug use. She is currently on chemotherapy for her lung cancer.  ? ?ROS: ?Review of Systems  ?Constitutional:  Negative for chills and fever.  ?Respiratory:  Positive for shortness of breath. Negative for wheezing.   ?     Pleurex catheter in place, chronic SOB on home O2  ?Cardiovascular:   Negative for chest pain and palpitations.  ?Gastrointestinal:  Positive for abdominal pain, blood in stool and melena. Negative for nausea and vomiting.  ?Genitourinary:  Negative for dysuria, frequency and urgency.  ?Neurological:  Positive for dizziness and weakness.  ? ?Family History  ?Problem Relation Age of Onset  ? Stroke Mother   ? Diabetes Mother   ? Kidney cancer Mother   ? Stomach cancer Mother   ? Heart disease Mother   ? Stroke Father   ? Heart disease Brother   ? Heart disease Brother   ? Crohn's disease Brother   ? Heart disease Sister   ?     CATH,STENT  ? Clotting disorder Sister   ? Cervical cancer Sister   ? Lung cancer Sister   ? Heart attack Sister   ? Diabetes Sister   ? Colon cancer Neg Hx   ? Esophageal cancer Neg Hx   ? Rectal cancer Neg Hx   ? ? ?Past Medical History:  ?Diagnosis Date  ? Allergy   ? Angiodysplasia of cecum   ? Angiodysplasia of duodenum   ? Arthritis   ? Asteatotic eczema 02/25/2016  ? Asthma   ? Atrial fibrillation ([place]) 10/17/2017  ? Blood transfusion without reported diagnosis   ? Cancer [facility]   ? Cataract   ? CHF (congestive heart failure), NYHA class III, chronic, combined ([place]) 08/10/2017  ? Colonic polyp 02/16/2008  ? Tubular adenoma  ? COPD (chronic obstructive pulmonary disease) ([place]) 01/27/1998  ? Cor pulmonale ([place]) 11/16/2014  ? GERD 07/30/1992  ?  Gout   ? HTN (hypertension) 07/30/1988  ? Hyperlipidemia with target LDL less than 100 11/27/1996  ?     ? Kidney stone 1960, 1972, 1991  ? MGUS (monoclonal gammopathy of unknown significance) 11/08/2016  ? Morbid obesity ([place]) 04/19/2010  ? She agrees to work on her lifestyle modifications to help her lose weight.   ? OSA (obstructive sleep apnea) 06/22/2013  ? Osteopenia, senile 02/05/2013  ? July 2014  -2.1 left femur -1.9 left forearm   ? Prediabetes 11/13/2006  ? Renal insufficiency   ? Stenosis of cervical spine with myelopathy ([place]) 01/01/2018  ? Ulcer of leg, chronic, right ([place]) 10/10/2011  ? ULCERATIVE  COLITIS-LEFT SIDE 03/19/2008  ?     ? Vitamin B12 deficiency anemia 11/13/2006  ?     ? Vitamin D deficiency 02/06/2013  ? ? ?Past Surgical History:  ?Procedure Laterality Date  ? BRONCHOSCOPY    ? CHEST TUBE INSERTION Left 06/15/2021  ? Procedure: INSERTION PLEURAL DRAINAGE CATHETER;  Surgeon: [name], MD;  Location: [place];  Service: Thoracic;  Laterality: Left;  ? COLONOSCOPY    ? COLONOSCOPY WITH PROPOFOL N/A 02/23/2015  ? Procedure: COLONOSCOPY WITH PROPOFOL;  Surgeon: Ladene Artist, MD;  Location: [facility];  Service: Endoscopy;  Laterality: N/A;  ? COLONOSCOPY WITH PROPOFOL N/A 08/12/2018  ? Procedure: COLONOSCOPY WITH PROPOFOL;  Surgeon: Ladene Artist, MD;  Location: [facility];  Service: Endoscopy;  Laterality: N/A;  ? COLONOSCOPY WITH PROPOFOL N/A 05/09/2020  ? Procedure: COLONOSCOPY WITH PROPOFOL;  Surgeon: Ladene Artist, MD;  [facility];  Service: Endoscopy;  Laterality: N/A;  To Splenic Flexture  ? ESOPHAGOGASTRODUODENOSCOPY (EGD) WITH PROPOFOL N/A 02/23/2015  ? Procedure: ESOPHAGOGASTRODUODENOSCOPY (EGD) WITH PROPOFOL;  Surgeon: Ladene Artist, MD;  [facility];  Service: Endoscopy;  Laterality: N/A;  ? ESOPHAGOGASTRODUODENOSCOPY (EGD) WITH PROPOFOL N/A 08/12/2018  ? Procedure: ESOPHAGOGASTRODUODENOSCOPY (EGD) WITH PROPOFOL;  Surgeon: Ladene Artist, MD;  [facility];  Service: Endoscopy;  Laterality: N/A;  ? ESOPHAGOGASTRODUODENOSCOPY (EGD) WITH PROPOFOL N/A 05/08/2020  ? Procedure: ESOPHAGOGASTRODUODENOSCOPY (EGD) WITH PROPOFOL;  Surgeon: Yetta Flock, MD;  Location: [facility];  Service: Gastroenterology;  Laterality: N/A;  ? HOT HEMOSTASIS N/A 08/12/2018  ? Procedure: HOT HEMOSTASIS (ARGON PLASMA COAGULATION/BICAP);  Surgeon: Ladene Artist, MD;  [facility];  Service: Endoscopy;  Laterality: N/A;  EGD and Colon APC  ? HOT HEMOSTASIS N/A 05/08/2020  ? Procedure: HOT HEMOSTASIS (ARGON PLASMA COAGULATION/BICAP);   Surgeon: Yetta Flock, MD;  Location: [facility];  Service: Gastroenterology;  Laterality: N/A;  ? LAPAROTOMY N/A 08/11/2020  ? Procedure: EXPLORATORY LAPAROTOMY PARTIAL COLECTOMY AND COLOSTOMY WITH RESECTION OF A MESSENTERIC MASS;  Surgeon: Coralie Keens, MD;  Location: [facility];  Service: General;  Laterality: N/A;  ? POLYPECTOMY    ? ? ?Social History:  reports that she quit smoking about 25 years ago. Her smoking use included cigarettes. She has never used smokeless tobacco. She reports that she does not drink alcohol and does not use drugs. ? ?Allergies:  ?Allergies  ?Allergen Reactions  ? Celebrex [Celecoxib] Swelling and Other (See Comments)  ?  Ankles swell  ? Amlodipine Besylate Swelling and Other (See Comments)  ?  Ankles swell  ? Enalapril Cough  ? Lipitor [Atorvastatin]   ?  Muscle aches  ? Trelegy Ellipta [Fluticasone-Umeclidin-Vilant] Other (See Comments)  ?  Made the throat and esophagus "hurt"  ? Amoxicillin Diarrhea  ?  DID THE REACTION INVOLVE: Swelling of  the face/tongue/throat, SOB, or low BP? N ?Sudden or severe rash/hives, skin peeling, or the inside of the mouth or nose? N ?Did it require medical treatment? N ?When did it last happen?      MORE THAN 10 YEARS ?If all above answers are "NO", may proceed with cephalosporin use. ?  ? Codeine Sulfate Nausea Only  ? Hydrocodone-Acetaminophen Nausea Only  ? Tape Itching, Rash and Other (See Comments)  ?  Redness, also  ? ? ?Facility-Administered Medications Prior to Admission  ?Medication Dose Route Frequency Provider Last Rate Last Admin  ? cyanocobalamin ((VITAMIN B-12)) injection 1,000 mcg  1,000 mcg Intramuscular Q30 days [name], MD   1,000 mcg at 01/31/21 1507  ? ?Medications Prior to Admission  ?Medication Sig Dispense Refill  ? acetaminophen (TYLENOL) 325 MG tablet Take 325-650 mg by mouth daily as needed for moderate pain, fever or headache.     ? albuterol (PROAIR HFA) 108 (90 Base) MCG/ACT inhaler Inhale 1-2 puffs into  the lungs every 6 (six) hours as needed for wheezing or shortness of breath. 18 g 11  ? allopurinol (ZYLOPRIM) 100 MG tablet TAKE 1 TABLET(100 MG) BY MOUTH DAILY (Patient taking differently: Take 100 mg by mouth daily.)

## 2021-10-10 NOTE — Telephone Encounter (Signed)
Upon documentation review, noted patient is currently admitted to [facility]. PC team updated ?

## 2021-10-10 NOTE — Progress Notes (Signed)
Pt refused cpap tonight.  Pt stated she is very claustrophobic and does not want to wear cpap while here in the hospital.  Pt stated she does not wear one at home. ?

## 2021-10-10 NOTE — Progress Notes (Signed)
?Progress Note ? ? ?Patient: [name] DGU:[id number] DOB: August 09, 1940 DOA: 10/09/2021     1 ?DOS: the patient was seen and examined on 10/10/2021 ?  ? ? ? ? ?Brief hospital course: ?Mrs. Mcmartin is an 81 y.o. F with NSCLC widely metastatic to lymph nodes, left pleura, and bone on Tagrisso, recurrent GIB due to AVMs in duodenum and cecum, CKD IIIb baseline 1.7, hx ulcerative colitis, hx asthma/COPD/pHTN/dCHF and chronic respiratory failure on 2L home O2, MGUS, OSA, pAF not on AC and recurrent malignant effusion with PleurX as well as hx incarcerated hernia in Jan 2022 s/p colectomy and end colostomy who presented with hematochezia and melena. ? ?At baseline, has occasional blood or melena in her colostomy bag.  She has chronic iron deficiency as a result of this (presumably bleeding AVMs), and receives periodic transfusions from her oncologist. ? ?In the 2 weeks PTA, she had increase in rate of bleeding and melena in her bag associated with cramping before the blood.  The morning of admission, she had routine lab work, told her oncologist's office by phone of the hematochezia and was told to go to the ER.   ? ? ?3/13: Hemoglobin 6.5 -> admitted and transfused 1 unit ?3/14: Hgb still low; GI recommend CT abdomen and Gen Surg consult and clear liquids to rule out ischemia ? ? ? ? ? ? ?Assessment and Plan: ?* Gastrointestinal hemorrhage ?No blood in ostomy today.  Awaiting GI and Gen Surg recommendations.  GI asked for CT abdomen this morning ?GI hemorrhage is rarely reported with Tagrisso ? ?- Stop IV pantoprazole ?- Clears only ?- Consult gastroenterology and General Surgery ? ? ? ? ?Symptomatic anemia ?Patient's baseline hemoglobin is around 8.   ? ?Hgb this AM is 7.1 ?- Transfuse 1 more unit now ?- Check posttransfusion H&H ?- Continue iron ? ?Adenocarcinoma of left lung, stage 4 ([place]) ?- Continue Tagrisso ? ?Recurrent pleural effusion ?Has a Pleurx catheter ? ?COPD (chronic obstructive pulmonary disease)  ([place]) ?- Continue ICS/LABA ? ?Chronic respiratory failure with hypoxia (HCC) ?- Continue home oxygen ? ?AVM (arteriovenous malformation) of small bowel, acquired with hemorrhage ?- Consult GI ? ?Paroxysmal atrial fibrillation (HCC) ?Not on blood thinner due to chronic bleeding.  Heart rate controlled ?- Continue metoprolol ? ?CHF (congestive heart failure), NYHA class III, chronic, diastolic (HCC) ?- Hold torsemide until hemodynamics clear ?-Continue Lopressor and bidil ? ?Anemia, iron deficiency ?- Continue iron ? ?OSA (obstructive sleep apnea) ?Does not use CPAP ? ?Chronic kidney disease, stage 3b ([place]) ?Cr stable  ? ?Morbid obesity ([place]) ?BMI 31 ? ? ? ? ? ? ? ?  ? ?Subjective: No more bleeding this morning, has diffuse tenderness but no severe cramps, no fever, no vomiting ? ?Physical Exam: ?Vitals:  ? 10/10/21 1000 10/10/21 1328 10/10/21 1624 10/10/21 1643  ?BP: (!) 150/42 (!) 135/34 (!) 136/39 (!) 140/40  ?Pulse:  (!) 59 62 62  ?Resp:  16 16 16   ?Temp:  97.8 ?F (36.6 ?C) 98.2 ?F (36.8 ?C) 98 ?F (36.7 ?C)  ?TempSrc:  Oral Oral Oral  ?SpO2:  100% 99% 99%  ?Weight:      ?Height:      ? ?Obese adult female, lying in bed, pale, no acute distress ?RRR, soft systolic murmur, no peripheral edema ?Respiratory rate normal, lungs clear without rales or wheezes ?Abdomen with diffuse tenderness, large ventral hernia, ostomy with brownish liquidy stool ?Attention normal, affect appropriate, judgment insight appear normal ? ? ? ? ? ? ?  Data Reviewed: ?Gastroenterology and general surgery notes reviewed.  CT imaging report reviewed.  Nursing notes and vital signs reviewed. ?Labs are notable for hemoglobin down to 7.1, creatinine slightly up but not significantly. ?CT shows known large ventral hernia, no signs of small bowel obstruction, colitis, or incarceration. ? ? ? ? ?Family Communication: Daughter and granddaughter at the bedside ? ?Disposition: ?Status is: Inpatient ?Remains inpatient appropriate because:  ?She will  require another unit of blood today.  GI recommended continuing clear liquids today, general surgery consultation, and CT of the abdomen to evaluate for ischemia. ? ?If the CT shows no new findings, and general surgery recommended procedures, and GI recommend no procedures, we will likely advance her diet tomorrow morning and discharge tomorrow ? ? ? ? ? ? Planned Discharge Destination: Home ? ? ? ?  ? ?Author: ?Edwin Dada, MD ?10/10/2021 6:25 PM ? ?For on call review [email or web address].  ?

## 2021-10-11 DIAGNOSIS — K922 Gastrointestinal hemorrhage, unspecified: Secondary | ICD-10-CM | POA: Diagnosis not present

## 2021-10-11 DIAGNOSIS — C3492 Malignant neoplasm of unspecified part of left bronchus or lung: Secondary | ICD-10-CM | POA: Diagnosis not present

## 2021-10-11 DIAGNOSIS — D649 Anemia, unspecified: Secondary | ICD-10-CM | POA: Diagnosis not present

## 2021-10-11 DIAGNOSIS — D500 Iron deficiency anemia secondary to blood loss (chronic): Secondary | ICD-10-CM | POA: Diagnosis not present

## 2021-10-11 LAB — RENAL FUNCTION PANEL
Albumin: 3.4 g/dL — ABNORMAL LOW (ref 3.5–5.0)
Anion gap: 6 (ref 5–15)
BUN: 52 mg/dL — ABNORMAL HIGH (ref 8–23)
CO2: 28 mmol/L (ref 22–32)
Calcium: 9.8 mg/dL (ref 8.9–10.3)
Chloride: 105 mmol/L (ref 98–111)
Creatinine, Ser: 1.51 mg/dL — ABNORMAL HIGH (ref 0.44–1.00)
GFR, Estimated: 35 mL/min — ABNORMAL LOW (ref >60)
Glucose, Bld: 98 mg/dL (ref 70–99)
Phosphorus: 4 mg/dL (ref 2.5–4.6)
Potassium: 4.5 mmol/L (ref 3.5–5.1)
Sodium: 139 mmol/L (ref 135–145)

## 2021-10-11 LAB — TYPE AND SCREEN
ABO/RH(D): O POS
Antibody Screen: NEGATIVE
Donor AG Type: NEGATIVE
Donor AG Type: NEGATIVE
Unit division: 0
Unit division: 0

## 2021-10-11 LAB — CBC
HCT: 30.4 % — ABNORMAL LOW (ref 36.0–46.0)
Hemoglobin: 9.4 g/dL — ABNORMAL LOW (ref 12.0–15.0)
MCH: 26 pg (ref 26.0–34.0)
MCHC: 30.9 g/dL (ref 30.0–36.0)
MCV: 84 fL (ref 80.0–100.0)
Platelets: 118 10*3/uL — ABNORMAL LOW (ref 150–400)
RBC: 3.62 MIL/uL — ABNORMAL LOW (ref 3.87–5.11)
RDW: 18.7 % — ABNORMAL HIGH (ref 11.5–15.5)
WBC: 6.5 10*3/uL (ref 4.0–10.5)
nRBC: 0 % (ref 0.0–0.2)

## 2021-10-11 LAB — BPAM RBC
Blood Product Expiration Date: 202304012359
Blood Product Expiration Date: 202304062359
ISSUE DATE / TIME: 202303131953
ISSUE DATE / TIME: 202303141622
Unit Type and Rh: 5100
Unit Type and Rh: 5100

## 2021-10-11 LAB — GLUCOSE, CAPILLARY: Glucose-Capillary: 99 mg/dL (ref 70–99)

## 2021-10-11 LAB — MAGNESIUM: Magnesium: 2.8 mg/dL — ABNORMAL HIGH (ref 1.7–2.4)

## 2021-10-11 NOTE — TOC Initial Note (Signed)
Transition of Care (TOC) - Initial/Assessment Note  ? ? ?Patient Details  ?Name: [name] ?MRN: [id number] ?Date of Birth: 02/29/1941 ? ?Transition of Care (TOC) CM/SW Contact:    ?Syrus Nakama, Marjie Skiff, RN ?Phone Number: ?10/11/2021, 10:35 AM ? ?Clinical Narrative:                 ?Pt from home with family. She is active with [facility] for [facility] and [facility] for home palliative services. Both companies alerted of pt being in the hospital. [facility] orders received for DC.  ? ?Expected Discharge Plan: [place] ?Barriers to Discharge: No Barriers Identified ? ? ?Patient Goals and CMS Choice ?Patient states their goals for this hospitalization and ongoing recovery are:: To go home ?  ?  ? ?Expected Discharge Plan and Services ?Expected Discharge Plan: [place] ?  ?Discharge Planning Services: CM Consult ?Post Acute Care Choice: Home Health ?Living arrangements for the past 2 months: [place] ?Expected Discharge Date: 10/11/21               ?  ?  ?  ?  ?  ?HH Arranged: RN ?[place] Agency: [place] ?Date HH Agency Contacted: 10/11/21 ?Time [place]: 1607 ?Representative spoke with at [place]: Tommi Rumps ? ?Prior Living Arrangements/Services ?Living arrangements for the past 2 months: [place] ?Lives with:: Adult Children ?Patient language and need for interpreter reviewed:: Yes ?Do you feel safe going back to the place where you live?: Yes      ?Need for Family Participation in Patient Care: Yes (Comment) ?Care giver support system in place?: Yes (comment) ?  ?Criminal Activity/Legal Involvement Pertinent to Current Situation/Hospitalization: No - Comment as needed ? ?Activities of Daily Living ?Home Assistive Devices/Equipment: Bedside commode/3-in-1, Cane (specify quad or straight), Dentures (specify type) ?ADL Screening (condition at time of admission) ?Patient's cognitive ability adequate to safely complete daily activities?: Yes ?Is the patient deaf  or have difficulty hearing?: No ?Does the patient have difficulty seeing, even when wearing glasses/contacts?: Yes ?Does the patient have difficulty concentrating, remembering, or making decisions?: Yes ?Patient able to express need for assistance with ADLs?: Yes ?Does the patient have difficulty dressing or bathing?: Yes ?Independently performs ADLs?: No ?Communication: Independent ?Dressing (OT): Needs assistance ?Is this a change from baseline?: Change from baseline, expected to last <3days ?Grooming: Needs assistance ?Is this a change from baseline?: Change from baseline, expected to last <3 days ?Feeding: Independent ?Bathing: Needs assistance ?Is this a change from baseline?: Change from baseline, expected to last <3 days ?Toileting: Needs assistance ?Is this a change from baseline?: Change from baseline, expected to last <3 days ?In/Out Bed: Needs assistance ?Is this a change from baseline?: Change from baseline, expected to last <3 days ?Walks in Home: Independent ?Does the patient have difficulty walking or climbing stairs?: Yes (uses walker at home) ?Weakness of Legs: Both ?Weakness of Arms/Hands: Both ? ?Permission Sought/Granted ?Permission sought to share information with : Customer service manager ?Permission granted to share information with : Yes, Verbal Permission Granted ?   ? Permission granted to share info w AGENCY: [facility] ?   ?   ? ?Emotional Assessment ?Appearance:: Appears stated age ?Attitude/Demeanor/Rapport: Gracious ?Affect (typically observed): Calm ?Orientation: : Oriented to Self, Oriented to Place, Oriented to  Time, Oriented to Situation ?Alcohol / Substance Use: Not Applicable ?Psych Involvement: No (comment) ? ?Admission diagnosis:  Melanotic stools [K92.1] ?Patient Active Problem List  ? Diagnosis  Date Noted  ? Gastrointestinal hemorrhage 10/09/2021  ? Encounter for therapeutic drug monitoring 08/16/2021  ? Renal mass 06/28/2021  ? Adenocarcinoma of left lung, stage 4 ([place])  06/08/2021  ? Metastatic adenocarcinoma ([place]) 05/21/2021  ? Recurrent pleural effusion 05/05/2021  ? Normocytic anemia 05/04/2021  ? Irreducible ventral hernia 08/09/2020  ? COPD (chronic obstructive pulmonary disease) ([place]) 08/09/2020  ? GI bleed 05/08/2020  ? Chronic respiratory failure with hypoxia ([place]) 02/06/2020  ? Routine general medical examination at a health care facility 02/06/2020  ? AVM (arteriovenous malformation) of small bowel, acquired with hemorrhage 08/14/2018  ? Stenosis of cervical spine with myelopathy ([place]) 01/01/2018  ? Paroxysmal atrial fibrillation ([place]) 10/17/2017  ? CHF (congestive heart failure), NYHA class III, chronic, diastolic ([place]) 38/46/6599  ? Symptomatic anemia 08/10/2017  ? Angiodysplasia of cecum   ? Angiodysplasia of duodenum   ? Grade III diastolic dysfunction 35/70/1779  ? Dysuria 08/08/2017  ? MGUS (monoclonal gammopathy of unknown significance) 11/08/2016  ? Asteatotic eczema 02/25/2016  ? COLD (chronic obstructive lung disease) ([place])   ? Anemia, iron deficiency 11/16/2014  ? Cor pulmonale ([place]) 11/16/2014  ? OSA (obstructive sleep apnea) 06/22/2013  ? Chronic venous stasis dermatitis of both lower extremities 06/02/2013  ? Vitamin D deficiency 02/06/2013  ? Osteopenia, senile 02/05/2013  ? Other screening mammogram 02/05/2013  ? Chronic kidney disease, stage 3b ([place]) 06/20/2010  ? Morbid obesity ([place]) 04/19/2010  ? Gout 01/10/2009  ? ULCERATIVE COLITIS-LEFT SIDE 03/19/2008  ? Vitamin B12 deficiency anemia 11/13/2006  ? Hyperlipidemia with target LDL less than 100 11/27/1996  ? GERD 07/30/1992  ? HTN (hypertension) 07/30/1988  ? ?PCP:  [name], MD ?Pharmacy:   ?[facility], [address] ?[address] ?[place] [address] ?Phone: [phone] Fax: [phone] ? ? ? ? ?Social Determinants of Health (SDOH) Interventions ?  ? ?Readmission Risk Interventions ?Readmission Risk Prevention Plan  10/11/2021  ?Transportation Screening Complete  ?PCP or Specialist Appt within 3-5 Days Complete  ?[place] or Home Care Consult Complete  ?Social Work Consult for [place] Planning/Counseling Complete  ?Palliative Care Screening Complete  ?Medication Review Press photographer) Complete  ?Some recent data might be hidden  ? ? ? ?

## 2021-10-11 NOTE — Progress Notes (Signed)
? ?Progress Note ? ?   ?Subjective: ?Pt reports she still had some dark stools overnight but she is having ostomy output. She got a little nauseated yesterday from not eating but no vomiting. She denies increased abdominal pain. She reiterated that she does not want to go through another big abdominal surgery.  ? ?Objective: ?Vital signs in last 24 hours: ?Temp:  [97.6 ?F (36.4 ?C)-98.2 ?F (36.8 ?C)] 97.6 ?F (36.4 ?C) (03/15 0539) ?Pulse Rate:  [59-66] 65 (03/15 0616) ?Resp:  [16-18] 18 (03/15 0539) ?BP: (135-170)/(32-63) 141/63 (03/15 3662) ?SpO2:  [99 %-100 %] 100 % (03/15 0539) ?Last BM Date : 10/11/21 ? ?Intake/Output from previous day: ?03/14 0701 - 03/15 0700 ?In: 622.3 [I.V.:250; Blood:372.3] ?Out: 600 [Urine:200; Stool:400] ?Intake/Output this shift: ?Total I/O ?In: -  ?Out: 100 [Stool:100] ? ?PE: ?General: pleasant, WD, chronically ill appearing female who is laying in bed in NAD ?Lungs: Respiratory effort non-labored on supplemental oxygen ?Abd: soft, mild ttp around parastomal hernia that is soft and partially reducible, some residual dark stool material in ostomy appliance with light brown stool present on stoma, gas in ostomy appliance, no peritonitis and +BS ? ? ?Lab Results:  ?Recent Labs  ?  10/09/21 ?1224 10/10/21 ?0036 10/10/21 ?2049  ?WBC 8.7 5.8  --   ?HGB 6.5* 7.1* 8.6*  ?HCT 21.1* 22.9* 27.9*  ?PLT 147* 116*  --   ? ?BMET ?Recent Labs  ?  10/10/21 ?0036 10/11/21 ?9476  ?NA 134* 139  ?K 3.8 4.5  ?CL 101 105  ?CO2 26 28  ?GLUCOSE 91 98  ?BUN 72* 52*  ?CREATININE 2.15* 1.51*  ?CALCIUM 9.1 9.8  ? ?PT/INR ?Recent Labs  ?  10/09/21 ?1224  ?LABPROT 13.9  ?INR 1.1  ? ?CMP  ?   ?Component Value Date/Time  ? NA 139 10/11/2021 0754  ? NA 142 04/11/2016 1007  ? K 4.5 10/11/2021 0754  ? K 3.3 (L) 04/11/2016 1007  ? CL 105 10/11/2021 0754  ? CO2 28 10/11/2021 0754  ? CO2 31 (H) 04/11/2016 1007  ? GLUCOSE 98 10/11/2021 0754  ? GLUCOSE 78 04/11/2016 1007  ? BUN 52 (H) 10/11/2021 0754  ? BUN 24.4 04/11/2016  1007  ? CREATININE 1.51 (H) 10/11/2021 0754  ? CREATININE 1.98 (H) 10/09/2021 0859  ? CREATININE 1.36 (H) 02/04/2020 1443  ? CREATININE 1.1 04/11/2016 1007  ? CALCIUM 9.8 10/11/2021 0754  ? CALCIUM 9.8 04/11/2016 1007  ? PROT 6.9 10/09/2021 1224  ? PROT 6.9 04/11/2016 1007  ? PROT 7.5 04/11/2016 1007  ? ALBUMIN 3.4 (L) 10/11/2021 0754  ? ALBUMIN 3.0 (L) 04/11/2016 1007  ? AST 11 (L) 10/09/2021 1224  ? AST 10 (L) 10/09/2021 0859  ? AST 12 04/11/2016 1007  ? ALT <5 10/09/2021 1224  ? ALT 5 10/09/2021 0859  ? ALT 12 04/11/2016 1007  ? ALKPHOS 40 10/09/2021 1224  ? ALKPHOS 82 04/11/2016 1007  ? BILITOT 0.2 (L) 10/09/2021 1224  ? BILITOT 0.2 (L) 10/09/2021 0859  ? BILITOT 0.35 04/11/2016 1007  ? GFRNONAA 35 (L) 10/11/2021 0754  ? GFRNONAA 25 (L) 10/09/2021 0859  ? GFRAA 30 (L) 02/19/2020 1359  ? ?Lipase  ?   ?Component Value Date/Time  ? LIPASE 35 08/08/2020 1756  ? ? ? ? ? ?Studies/Results: ?CT ABDOMEN PELVIS WO CONTRAST ? ?Result Date: 10/10/2021 ?CLINICAL DATA:  Concern for perforation EXAM: CT ABDOMEN AND PELVIS WITHOUT CONTRAST TECHNIQUE: Multidetector CT imaging of the abdomen and pelvis was performed following the standard protocol  without IV contrast. RADIATION DOSE REDUCTION: This exam was performed according to the departmental dose-optimization program which includes automated exposure control, adjustment of the mA and/or kV according to patient size and/or use of iterative reconstruction technique. COMPARISON:  CT chest abdomen and pelvis dated August 14, 2021 FINDINGS: Lower chest: Small loculated left pleural effusion, similar prior exam. Coronary artery calcifications. No pericardial effusion. Hepatobiliary: No focal liver abnormality is seen. No gallstones, gallbladder wall thickening, or biliary dilatation. Pancreas: Unremarkable. No pancreatic ductal dilatation or surrounding inflammatory changes. Spleen: Normal in size without focal abnormality. Adrenals/Urinary Tract: Thickening and nodularity of the  left adrenal gland, unchanged compared to prior exam. Right adrenal gland is unremarkable. Heterogeneous lesion of the mid region of the right kidney measuring 3.9 x 3.5 cm on series 2, image 26, unchanged in size and compared with prior exam. Additional simple and minimally complex renal lesions are unchanged when compared to prior exam. Stomach/Bowel: Stomach is within normal limits. Left lower quadrant colostomy with associated large ventral abdominal wall hernias containing nondilated loops of small and large bowel. Previously visualized intraluminal cecal lesion is no longer seen and was likely due to stool. Appendix is not definitely visualized although there are no secondary findings of acute appendicitis. Vascular/Lymphatic: Aortic atherosclerosis. No enlarged abdominal or pelvic lymph nodes. Reproductive: Uterus and bilateral adnexa are unremarkable. Other: No abdominopelvic ascites or evidence of free air. Musculoskeletal: Unchanged lumbar spine compression deformities. No aggressive appearing osseous lesions. IMPRESSION: 1. No acute findings in the abdomen pelvis. 2. Small loculated left pleural effusion is unchanged in size when compared with most recent prior exam. 3. Left lower quadrant colostomy with associated large ventral abdominal wall hernias containing nondilated loops of small and large bowel. 4. Heterogeneous renal mass of the mid region of the right kidney is unchanged in size when compared with prior exam and remains concerning for renal cell carcinoma. 5.  Aortic Atherosclerosis (ICD10-I70.0). Electronically Signed   By: Yetta Glassman M.D.   On: 10/10/2021 15:07   ? ?Anti-infectives: ?Anti-infectives (From admission, onward)  ? ? None  ? ?  ? ? ? ?Assessment/Plan ?S/P exploratory laparotomy with resection of mesenteric mass, partial colectomy and end colostomy 08/11/20 Dr. Ninfa Linden ?Chronically incarcerated ventral hernias ?Hx of UC ?Recurrent GIB secondary to AVMs ?Hematochezia from  colostomy  ?- Last CT 08/14/21 shows persistent large ventral hernias containing distal small bowel, appendix and colon. ?- patient is not clinically obstructed  ?- GI following for GI bleeding as well ?- CT 3/14 without obstruction or acute intra-abdominal findings ?- patient once again stated that she did not feel she could tolerate another big abdominal surgery, although currently there is no indication for emergent/urgent operation ?- recommend medical management of intermittent GI bleeding with transfusion as needed, consider adding PPI, consider bowel regimen if struggling with decreased ostomy output and constipation  ?- no other recommendations from a surgical standpoint, general surgery will sign off at this time, please call if we can be of further assistance ?  ?ABL on chronic iron deficiency anemia - secondary to above, hgb 8.6 yesterday PM from 6.5 s/p 1 unit PRBC 3/13 and 1 unit PRBC 3/14, transfusion per primary  ?  ?FEN: reg diet, KVO ?VTE: SCDs, no chemical prophylaxis in setting of GI bleeding  ?ID: no current abx ?  ?- below per [facility] -  ?NSCLC, stage IV - on chemotherapy  ?COPD ?Chronic respiratory failure with chronic hypoxia on home O2 ?Recurrent pleural effusion with pleurex catheter in  place ?Paroxysmal A. Fib, rate controlled ?Chronic diastolic CHF ?CKD stage III - Cr 1.5 this AM, baseline around 1.7 ?Obesity class I - BMI 31.76 ?OSA ?HTN  ?HLD ?Gout ?MGUS ? LOS: 2 days  ? ?I reviewed hospitalist notes, last 24 h vitals and pain scores, last 48 h intake and output, last 24 h labs and trends, and last 24 h imaging results. ? ? ? ?Norm Parcel, PA-C ?[facility] ?10/11/2021, 8:22 AM ?Please see Amion for pager number during day hours 7:00am-4:30pm ? ?

## 2021-10-11 NOTE — Discharge Planning (Signed)
Patient's iv taken out and discharge instructions went over with patient, patients appt list also went over with patient and patient wheeled down via wheelchair to lobby ?

## 2021-10-11 NOTE — Assessment & Plan Note (Addendum)
S/p colostomy ?-HH RN for colostomy care ?

## 2021-10-11 NOTE — Progress Notes (Signed)
Report rec'ed from [place].Pt. Sleeping lightly, alert and oriented, colostomy in place, assisted pt to [facility] and colostomy bag emptied. See flowsheet for additional document. Pt denied any pain or any concerns at this time. No distress noted. Will continue to monitor pt closely.  ?

## 2021-10-11 NOTE — Discharge Summary (Signed)
? ?Physician Discharge Summary  ?[name] CBJ:[id number] DOB: 12-09-1940 DOA: 10/09/2021 ? ?PCP: [name], MD ? ?Admit date: 10/09/2021 ?Discharge date: 10/11/2021 ?Admitted From: Home ?Disposition: Home ?Recommendations for Outpatient Follow-up:  ?Follow ups as below. ?Resume outpatient follow-up with palliative care ?Please obtain CBC/BMP/Mag at follow up ?Please follow up on the following pending results: None ? ?Home Health: Patient refused PT/OT evaluation.  Resumed HH RN on discharge. ?Equipment/Devices: Patient has home oxygen a walker ? ?Discharge Condition: Stable but guarded prognosis ?CODE STATUS: Full code ? Follow-up Information   ? ? [name], MD. Schedule an appointment as soon as possible for a visit in 1 week(s).   ?Specialty: Internal Medicine ?Contact information: ?[place] [address] ?[phone] ? ? ?  ?  ? ?  ?  ? ?  ? ? ?Hospital course ?81 year old F with PMH of NSCLC with wide metastasis to lymph nodes, left pleura and bone on Tagrisso, recurrent GIB due to AVMs in duodenum and cecum, CKD IIIb baseline 1.7, hx ulcerative colitis, hx asthma/COPD/pHTN/dCHF and chronic respiratory failure on 2L home O2, MGUS, OSA, pAF not on AC and recurrent malignant effusion with PleurX as well as hx incarcerated hernia in Jan 2022 s/p colectomy and end colostomy who presented with hematochezia and melena. ? ?At baseline, has occasional blood or melena in her colostomy bag.  She has chronic iron deficiency as a result of this (presumably bleeding AVMs), and receives periodic transfusions from her oncologist. ? ?In the 2 weeks PTA, she had increase in rate of bleeding and melena in her bag associated with cramping before the blood.  The morning of admission, she had routine lab work, told her oncologist's office by phone of the hematochezia and was told to go to the ER.  Hemoglobin was 6.5.  Transfused 2 units with appropriate response.  GI consulted and recommended CT  abdomen and pelvis, that did not show acute finding other than known small left pleural effusion, LLQ colostomy with associated large ventral abdominal wall hernias containing nondilated loops of small and large bowels, and heterogeneous right renal mass concerning for RCC.  General surgery consulted and did not feel there is surgical indication, and signed off. ? ?Patient was transfused 2 units with appropriate response.  She has not had further melena or hematochezia.  Hgb improved and remained stable.  She tolerated a regular diet.  Discharge home with home health for outpatient follow-up. ?  ? ?See individual problem list below for more on hospital course. ? ?Problems addressed during this hospitalization ?Problem  ?Gastrointestinal Hemorrhage  ?Symptomatic acute on chronic blood loss anemia in patient with history of AVM  ?Adenocarcinoma of Left Lung, Stage 4 ([facility])  ?Copd (Chronic Obstructive Pulmonary Disease) (Hcc)  ?Chronic Respiratory Failure With Hypoxia (H[facility])  ?Avm (Arteriovenous Malformation) of Small Bowel, Acquired With Hemorrhage  ?Paroxysmal Atrial Fibrillation (Hcc)  ?Chf (Congestive Heart Failure), Nyha Class III, Chronic, Diastolic (Hcc)  ?Chronic Kidney Disease, Stage 3b (Hcc)  ?Morbid Obesity (Hcc)  ?ULCERATIVE COLITIS-LEFT SIDE  ?  ? ?  ?  ?Assessment and Plan: ?* Gastrointestinal hemorrhage ?Seems to have resolved.  CT abdomen and pelvis without acute finding.  GI and general surgery signed off.  H&H remained stable after 2 units of transfusion. ?-Recheck CBC at follow-up ? ? ? ? ?Symptomatic acute on chronic blood loss anemia in patient with history of AVM ?Recent Labs  ?  06/08/21 ?0902 06/27/21 ?1052 07/18/21 ?1324 08/14/21 ?1500 09/12/21 ?[place]  10/09/21 ?0859 10/09/21 ?1224 10/10/21 ?0036 10/10/21 ?2049 10/11/21 ?0754  ?HGB 10.0* 8.8* 8.6* 6.8* 8.6* 6.6* 6.5* 7.1* 8.6* 9.4*  ?Transfused 2 units with appropriate response.  No further GI bleed. ?-Recheck CBC in 1 to 2 weeks ? ?Adenocarcinoma  of left lung, stage 4 ([place]) ?-Continue Tagrisso ?-Outpatient follow-up with oncology ? ?Recurrent pleural effusion ?Has a Pleurx catheter ? ?COPD (chronic obstructive pulmonary disease) ([place]) ?Stable ?-Continue home inhalers. ? ?Chronic respiratory failure with hypoxia ([place]) ?-Continue home oxygen ? ?AVM (arteriovenous malformation) of small bowel, acquired with hemorrhage ?GI signed off. ?Chronic blood loss anemia as above ? ?Paroxysmal atrial fibrillation (HCC) ?Not on anticoagulation due to history of GI bleed. ?-Continue metoprolol ? ?CHF (congestive heart failure), NYHA class III, chronic, diastolic (HCC) ?Continue home medications. ? ?Anemia, iron deficiency ?- Continue iron ? ?OSA (obstructive sleep apnea) ?Does not use CPAP ? ?Chronic kidney disease, stage 3b ([place]) ?Recent Labs  ?  05/23/21 ?1107 06/08/21 ?0902 06/27/21 ?1052 07/18/21 ?1324 08/14/21 ?1500 09/12/21 ?1530 10/09/21 ?0859 10/09/21 ?1224 10/10/21 ?0036 10/11/21 ?6295  ?BUN 25* 40* 27* 48* 63* 79* 76* 74* 72* 52*  ?CREATININE 1.28* 1.37* 1.56* 1.61* 1.67* 1.70* 1.98* 1.98* 2.15* 1.51*  ?Creatinine better than baseline. ?-Recheck renal function in 1 to 2 weeks ? ? ?ULCERATIVE COLITIS-LEFT SIDE ?S/p colostomy ?-HH RN for colostomy care ? ?Morbid obesity ([place]) ?Body mass index is 31.76 kg/m?. ? ? ?Pressure skin injury: POA ?Pressure Injury 10/09/21 Buttocks Left;Medial Stage 2 -  Partial thickness loss of dermis presenting as a shallow open injury with a red, pink wound bed without slough. 2 small wounds noted on Lt inner buttock, also very redden (Active)  ?10/09/21 1700  ?Location: Buttocks  ?Location Orientation: Left;Medial  ?Staging: Stage 2 -  Partial thickness loss of dermis presenting as a shallow open injury with a red, pink wound bed without slough.  ?Wound Description (Comments): 2 small wounds noted on Lt inner buttock, also very redden  ?Present on Admission: Yes  ? ? ?Vital signs ?Vitals:  ? 10/10/21 2310 10/11/21 0539 10/11/21 0616  10/11/21 1221  ?BP: (!) 157/32 (!) 170/42 (!) 141/63 (!) 153/42  ?Pulse: 66 63 65 60  ?Temp:  97.6 ?F (36.4 ?C)  98.3 ?F (36.8 ?C)  ?Resp: 16 18  17   ?Height:      ?Weight:      ?SpO2: 99% 100%  100%  ?TempSrc:  Oral  Oral  ?BMI (Calculated):      ?  ? ?Discharge exam ? ?GENERAL: No apparent distress.  Nontoxic. ?HEENT: MMM.  Vision and hearing grossly intact.  ?NECK: Supple.  No apparent JVD.  ?RESP:  No IWOB.  Fair aeration bilaterally. ?CVS:  RRR. Heart sounds normal.  ?ABD/GI/GU: BS+. Abd soft.  Ventral abdominal hernia.  Colostomy bag with normal looking stool. ?MSK/EXT:  Moves extremities. No apparent deformity. No edema.  ?SKIN: no apparent skin lesion or wound ?NEURO: Awake and alert. Oriented appropriately.  No apparent focal neuro deficit. ?PSYCH: Calm. Normal affect.  ? ?Discharge Instructions ?Discharge Instructions   ? ? Call MD for:   Complete by: As directed ?  ? Bleeding in ostomy  ? Call MD for:  difficulty breathing, headache or visual disturbances   Complete by: As directed ?  ? Call MD for:  extreme fatigue   Complete by: As directed ?  ? Call MD for:  severe uncontrolled pain   Complete by: As directed ?  ? Call MD for:  temperature >100.4  Complete by: As directed ?  ? Diet - low sodium heart healthy   Complete by: As directed ?  ? Discharge instructions   Complete by: As directed ?  ? It has been a pleasure taking care of you! ? ?You were hospitalized due to blood in the stool and anemia for which you have been treated with blood transfusion.  Your bleeding seems to have subsided.  Your symptoms improved as well.  We suggest you hold off your fluid medication (torsemide) for the next 2 to 3 days.  Avoid any over-the-counter pain medication other than plain Tylenol.  Continue monitoring your stool for significant bleeding.  Follow-up with your primary care doctor in 1 to 2 weeks or sooner if needed.  Follow-up with your oncologist as previously planned. ? ? ?Take care,  ? Increase activity  slowly   Complete by: As directed ?  ? No wound care   Complete by: As directed ?  ? ?  ? ?Allergies as of 10/11/2021   ? ?   Reactions  ? Celebrex [celecoxib] Swelling, Other (See Comments)  ? Ankles swell

## 2021-10-12 ENCOUNTER — Encounter: Payer: Self-pay | Admitting: Internal Medicine

## 2021-10-12 ENCOUNTER — Other Ambulatory Visit: Payer: Self-pay

## 2021-10-12 ENCOUNTER — Inpatient Hospital Stay: Payer: Medicare Other | Admitting: Internal Medicine

## 2021-10-12 VITALS — BP 153/48 | HR 52 | Temp 96.9°F | Resp 18 | Ht 60.0 in

## 2021-10-12 DIAGNOSIS — C7951 Secondary malignant neoplasm of bone: Secondary | ICD-10-CM | POA: Diagnosis not present

## 2021-10-12 DIAGNOSIS — I509 Heart failure, unspecified: Secondary | ICD-10-CM | POA: Diagnosis not present

## 2021-10-12 DIAGNOSIS — C3492 Malignant neoplasm of unspecified part of left bronchus or lung: Secondary | ICD-10-CM | POA: Diagnosis not present

## 2021-10-12 DIAGNOSIS — C772 Secondary and unspecified malignant neoplasm of intra-abdominal lymph nodes: Secondary | ICD-10-CM | POA: Diagnosis not present

## 2021-10-12 DIAGNOSIS — C799 Secondary malignant neoplasm of unspecified site: Secondary | ICD-10-CM | POA: Diagnosis not present

## 2021-10-12 DIAGNOSIS — C3490 Malignant neoplasm of unspecified part of unspecified bronchus or lung: Secondary | ICD-10-CM

## 2021-10-12 DIAGNOSIS — C775 Secondary and unspecified malignant neoplasm of intrapelvic lymph nodes: Secondary | ICD-10-CM | POA: Diagnosis not present

## 2021-10-12 DIAGNOSIS — C782 Secondary malignant neoplasm of pleura: Secondary | ICD-10-CM | POA: Diagnosis not present

## 2021-10-12 DIAGNOSIS — I110 Hypertensive heart disease with heart failure: Secondary | ICD-10-CM | POA: Diagnosis not present

## 2021-10-12 DIAGNOSIS — C771 Secondary and unspecified malignant neoplasm of intrathoracic lymph nodes: Secondary | ICD-10-CM | POA: Diagnosis not present

## 2021-10-12 DIAGNOSIS — D649 Anemia, unspecified: Secondary | ICD-10-CM | POA: Diagnosis not present

## 2021-10-12 NOTE — Progress Notes (Signed)
?    [facility] ?[phone]   [phone] ? ?OFFICE PROGRESS NOTE ? ?[name], MD ?[place] [address] ? ?DIAGNOSIS: Stage IV (Tx, N3, M1C) non-small cell lung cancer, adenocarcinoma. She presented with a left primary lung lesion and widespread metastatic disease in the thoracic nodes, left pleural space, abdomen pelvic lymph nodes, and bones.  She was diagnosed in October 2022. ? ?Molecular studies: The patient has positive EGFR mutation in exon 21 ([id number]) ?  ?PRIOR THERAPY:   ?  ?CURRENT THERAPY: Targeted treatment with Tagrisso 80 mg p.o. daily.  First dose started on June 21, 2021.  Status post 5 months. ? ?INTERVAL HISTORY: ?CHERIE LASALLE 81 y.o. female returns to the clinic today for follow-up visit accompanied by her daughter.  The patient was recently admitted to the hospital with significant anemia and hematochezia and hemoglobin was down to 6.5.  She received 2 units of PRBCs transfusion.  The patient was seen by gastroenterology and CT scan of the abdomen and pelvis did not show any acute findings other than the small left pleural effusion and colostomy with associated large ventral abdominal wall hernia containing nondilated loops of small and large bowels and heterogeneous right renal mass concerning for renal cell carcinoma.  She was also seen by surgery and they did not recommend any additional intervention.  The patient is feeling a little bit better today.  Her hemoglobin is up to 9.4.  She continues to tolerate her treatment with Tagrisso fairly well.  She denied having any current nausea, vomiting, diarrhea or constipation.  She denied having any headache or visual changes.  She has no weight loss or night sweats. ? ? ?MEDICAL HISTORY: ?Past Medical History:  ?Diagnosis Date  ? Allergy   ? Angiodysplasia of cecum   ? Angiodysplasia of duodenum   ? Arthritis   ? Asteatotic eczema 02/25/2016  ? Asthma   ? Atrial fibrillation  ([place]) 10/17/2017  ? Blood transfusion without reported diagnosis   ? Cancer [facility])   ? Cataract   ? CHF (congestive heart failure), NYHA class III, chronic, combined ([place]) 08/10/2017  ? Colonic polyp 02/16/2008  ? Tubular adenoma  ? COPD (chronic obstructive pulmonary disease) ([place]) 01/27/1998  ? Cor pulmonale ([place]) 11/16/2014  ? GERD 07/30/1992  ? Gout   ? HTN (hypertension) 07/30/1988  ? Hyperlipidemia with target LDL less than 100 11/27/1996  ?     ? Kidney stone 1960, 1972, 1991  ? MGUS (monoclonal gammopathy of unknown significance) 11/08/2016  ? Morbid obesity ([place]) 04/19/2010  ? She agrees to work on her lifestyle modifications to help her lose weight.   ? OSA (obstructive sleep apnea) 06/22/2013  ? Osteopenia, senile 02/05/2013  ? July 2014  -2.1 left femur -1.9 left forearm   ? Prediabetes 11/13/2006  ? Renal insufficiency   ? Stenosis of cervical spine with myelopathy ([place]) 01/01/2018  ? Ulcer of leg, chronic, right ([place]) 10/10/2011  ? ULCERATIVE COLITIS-LEFT SIDE 03/19/2008  ?     ? Vitamin B12 deficiency anemia 11/13/2006  ?     ? Vitamin D deficiency 02/06/2013  ? ? ?ALLERGIES:  is allergic to celebrex [celecoxib], amlodipine besylate, enalapril, lipitor [atorvastatin], trelegy ellipta [fluticasone-umeclidin-vilant], amoxicillin, codeine sulfate, hydrocodone-acetaminophen, and tape. ? ?MEDICATIONS:  ?Current Outpatient Medications  ?Medication Sig Dispense Refill  ? acetaminophen (TYLENOL) 325 MG tablet Take 325-650 mg by mouth daily as needed for moderate pain, fever or headache.     ?  albuterol (PROAIR HFA) 108 (90 Base) MCG/ACT inhaler Inhale 1-2 puffs into the lungs every 6 (six) hours as needed for wheezing or shortness of breath. 18 g 11  ? allopurinol (ZYLOPRIM) 100 MG tablet TAKE 1 TABLET(100 MG) BY MOUTH DAILY (Patient taking differently: Take 100 mg by mouth daily.) 90 tablet 1  ? clonazePAM (KLONOPIN) 1 MG tablet Take 1 tablet (1 mg total) by mouth 2 (two) times daily as needed for anxiety.  (Patient taking differently: Take 0.5-1 mg by mouth 2 (two) times daily as needed for anxiety.) 180 tablet 0  ? ferrous sulfate 325 (65 FE) MG tablet Take 1 tablet (325 mg total) by mouth 2 (two) times daily with a meal. 180 tablet 1  ? fluticasone furoate-vilanterol (BREO ELLIPTA) 100-25 MCG/ACT AEPB Inhale 1 puff into the lungs daily. 120 each 1  ? isosorbide-hydrALAZINE (BIDIL) 20-37.5 MG tablet TAKE 1 TABLET BY MOUTH THREE TIMES DAILY (Patient taking differently: Take 1 tablet by mouth in the morning and at bedtime.) 270 tablet 0  ? metoprolol tartrate (LOPRESSOR) 25 MG tablet TAKE 1 TABLET(25 MG) BY MOUTH TWICE DAILY (Patient taking differently: Take 25 mg by mouth in the morning and at bedtime.) 180 tablet 1  ? morphine (MS CONTIN) 15 MG 12 hr tablet Take 1 tablet (15 mg total) by mouth 2 (two) times daily as needed for pain. 60 tablet 0  ? Nutritional Supplements (CARNATION BREAKFAST ESSENTIALS) LIQD Take 237 mLs by mouth daily.    ? osimertinib mesylate (TAGRISSO) 80 MG tablet Take 1 tablet (80 mg total) by mouth daily. 30 tablet 3  ? OXYGEN Inhale 2 L/min into the lungs continuous.    ? prochlorperazine (COMPAZINE) 10 MG tablet Take 1 tablet (10 mg total) by mouth every 6 (six) hours as needed. (Patient taking differently: Take 10 mg by mouth every 6 (six) hours as needed for nausea or vomiting.) 30 tablet 2  ? protein supplement shake (PREMIER PROTEIN) LIQD Take 11 oz by mouth 2 (two) times daily between meals.    ? torsemide (DEMADEX) 20 MG tablet TAKE 2 TABLETS(40 MG) BY MOUTH EVERY MORNING (Patient taking differently: Take 40 mg by mouth in the morning.) 180 tablet 0  ? ?Current Facility-Administered Medications  ?Medication Dose Route Frequency Provider Last Rate Last Admin  ? cyanocobalamin ((VITAMIN B-12)) injection 1,000 mcg  1,000 mcg Intramuscular Q30 days [name], MD   1,000 mcg at 01/31/21 1507  ? ? ?SURGICAL HISTORY:  ?Past Surgical History:  ?Procedure Laterality Date  ? BRONCHOSCOPY     ? CHEST TUBE INSERTION Left 06/15/2021  ? Procedure: INSERTION PLEURAL DRAINAGE CATHETER;  Surgeon: [name], MD;  Location: [facility];  Service: Thoracic;  Laterality: Left;  ? COLONOSCOPY    ? COLONOSCOPY WITH PROPOFOL N/A 02/23/2015  ? Procedure: COLONOSCOPY WITH PROPOFOL;  Surgeon: Ladene Artist, MD;  Location: [facility];  Service: Endoscopy;  Laterality: N/A;  ? COLONOSCOPY WITH PROPOFOL N/A 08/12/2018  ? Procedure: COLONOSCOPY WITH PROPOFOL;  Surgeon: Ladene Artist, MD;  Location: [facility];  Service: Endoscopy;  Laterality: N/A;  ? COLONOSCOPY WITH PROPOFOL N/A 05/09/2020  ? Procedure: COLONOSCOPY WITH PROPOFOL;  Surgeon: Ladene Artist, MD;  Location: [facility];  Service: Endoscopy;  Laterality: N/A;  To Splenic Flexture  ? ESOPHAGOGASTRODUODENOSCOPY (EGD) WITH PROPOFOL N/A 02/23/2015  ? Procedure: ESOPHAGOGASTRODUODENOSCOPY (EGD) WITH PROPOFOL;  Surgeon: Ladene Artist, MD;  [facility];  Service: Endoscopy;  Laterality: N/A;  ? ESOPHAGOGASTRODUODENOSCOPY (EGD) WITH PROPOFOL  N/A 08/12/2018  ? Procedure: ESOPHAGOGASTRODUODENOSCOPY (EGD) WITH PROPOFOL;  Surgeon: Ladene Artist, MD;  [facility];  Service: Endoscopy;  Laterality: N/A;  ? ESOPHAGOGASTRODUODENOSCOPY (EGD) WITH PROPOFOL N/A 05/08/2020  ? Procedure: ESOPHAGOGASTRODUODENOSCOPY (EGD) WITH PROPOFOL;  Surgeon: Yetta Flock, MD;  Location: [facility];  Service: Gastroenterology;  Laterality: N/A;  ? HOT HEMOSTASIS N/A 08/12/2018  ? Procedure: HOT HEMOSTASIS (ARGON PLASMA COAGULATION/BICAP);  Surgeon: Ladene Artist, MD;  Location: [facility];  Service: Endoscopy;  Laterality: N/A;  EGD and Colon APC  ? HOT HEMOSTASIS N/A 05/08/2020  ? Procedure: HOT HEMOSTASIS (ARGON PLASMA COAGULATION/BICAP);  Surgeon: Yetta Flock, MD;  Location: [facility];  Service: Gastroenterology;  Laterality: N/A;  ? LAPAROTOMY N/A 08/11/2020  ? Procedure: EXPLORATORY LAPAROTOMY PARTIAL COLECTOMY AND COLOSTOMY  WITH RESECTION OF A MESSENTERIC MASS;  Surgeon: Coralie Keens, MD;  Location: [facility];  Service: General;  Laterality: N/A;  ? POLYPECTOMY    ? ? ?REVIEW OF SYSTEMS:  A comprehensive review of systems

## 2021-10-13 ENCOUNTER — Other Ambulatory Visit: Payer: Self-pay | Admitting: *Deleted

## 2021-10-13 ENCOUNTER — Encounter: Payer: Self-pay | Admitting: Internal Medicine

## 2021-10-13 DIAGNOSIS — Z4801 Encounter for change or removal of surgical wound dressing: Secondary | ICD-10-CM | POA: Diagnosis not present

## 2021-10-13 DIAGNOSIS — H539 Unspecified visual disturbance: Secondary | ICD-10-CM | POA: Diagnosis not present

## 2021-10-13 DIAGNOSIS — E785 Hyperlipidemia, unspecified: Secondary | ICD-10-CM | POA: Diagnosis not present

## 2021-10-13 DIAGNOSIS — I130 Hypertensive heart and chronic kidney disease with heart failure and stage 1 through stage 4 chronic kidney disease, or unspecified chronic kidney disease: Secondary | ICD-10-CM | POA: Diagnosis not present

## 2021-10-13 DIAGNOSIS — J9621 Acute and chronic respiratory failure with hypoxia: Secondary | ICD-10-CM | POA: Diagnosis not present

## 2021-10-13 DIAGNOSIS — M19071 Primary osteoarthritis, right ankle and foot: Secondary | ICD-10-CM | POA: Diagnosis not present

## 2021-10-13 DIAGNOSIS — I2781 Cor pulmonale (chronic): Secondary | ICD-10-CM | POA: Diagnosis not present

## 2021-10-13 DIAGNOSIS — J449 Chronic obstructive pulmonary disease, unspecified: Secondary | ICD-10-CM | POA: Diagnosis not present

## 2021-10-13 DIAGNOSIS — G4733 Obstructive sleep apnea (adult) (pediatric): Secondary | ICD-10-CM | POA: Diagnosis not present

## 2021-10-13 DIAGNOSIS — M19072 Primary osteoarthritis, left ankle and foot: Secondary | ICD-10-CM | POA: Diagnosis not present

## 2021-10-13 DIAGNOSIS — J90 Pleural effusion, not elsewhere classified: Secondary | ICD-10-CM

## 2021-10-13 DIAGNOSIS — Z433 Encounter for attention to colostomy: Secondary | ICD-10-CM | POA: Diagnosis not present

## 2021-10-13 DIAGNOSIS — I700 Atherosclerosis of aorta: Secondary | ICD-10-CM | POA: Diagnosis not present

## 2021-10-13 DIAGNOSIS — M1030 Gout due to renal impairment, unspecified site: Secondary | ICD-10-CM | POA: Diagnosis not present

## 2021-10-13 DIAGNOSIS — M479 Spondylosis, unspecified: Secondary | ICD-10-CM | POA: Diagnosis not present

## 2021-10-13 DIAGNOSIS — N1832 Chronic kidney disease, stage 3b: Secondary | ICD-10-CM | POA: Diagnosis not present

## 2021-10-13 DIAGNOSIS — M810 Age-related osteoporosis without current pathological fracture: Secondary | ICD-10-CM | POA: Diagnosis not present

## 2021-10-13 DIAGNOSIS — I5042 Chronic combined systolic (congestive) and diastolic (congestive) heart failure: Secondary | ICD-10-CM | POA: Diagnosis not present

## 2021-10-13 DIAGNOSIS — D519 Vitamin B12 deficiency anemia, unspecified: Secondary | ICD-10-CM | POA: Diagnosis not present

## 2021-10-13 DIAGNOSIS — C3490 Malignant neoplasm of unspecified part of unspecified bronchus or lung: Secondary | ICD-10-CM | POA: Diagnosis not present

## 2021-10-13 DIAGNOSIS — D472 Monoclonal gammopathy: Secondary | ICD-10-CM | POA: Diagnosis not present

## 2021-10-13 DIAGNOSIS — M4802 Spinal stenosis, cervical region: Secondary | ICD-10-CM | POA: Diagnosis not present

## 2021-10-13 DIAGNOSIS — I878 Other specified disorders of veins: Secondary | ICD-10-CM | POA: Diagnosis not present

## 2021-10-13 DIAGNOSIS — Z48813 Encounter for surgical aftercare following surgery on the respiratory system: Secondary | ICD-10-CM | POA: Diagnosis not present

## 2021-10-13 DIAGNOSIS — D630 Anemia in neoplastic disease: Secondary | ICD-10-CM | POA: Diagnosis not present

## 2021-10-13 DIAGNOSIS — D631 Anemia in chronic kidney disease: Secondary | ICD-10-CM | POA: Diagnosis not present

## 2021-10-16 ENCOUNTER — Encounter (HOSPITAL_COMMUNITY): Payer: Self-pay | Admitting: Vascular Surgery

## 2021-10-16 ENCOUNTER — Encounter (HOSPITAL_COMMUNITY): Payer: Self-pay | Admitting: Thoracic Surgery (Cardiothoracic Vascular Surgery)

## 2021-10-16 ENCOUNTER — Other Ambulatory Visit: Payer: Self-pay

## 2021-10-16 NOTE — Progress Notes (Signed)
DUE TO COVID-19 ONLY ONE VISITOR IS ALLOWED TO COME WITH YOU AND STAY IN THE WAITING ROOM ONLY DURING PRE OP AND PROCEDURE DAY OF SURGERY.  ? ?PCP - Dr [name] ?Cardiologist - Dr Irish Lack  ?Electrophysiologist - Dr Rayann Heman ? ?Chest x-ray - 10/03/21 (2V) ?EKG - 10/09/21 ?Stress Test - 02/08/21 ?ECHO - 05/11/20 ?Cardiac Cath - n/a ? ?ICD Pacemaker/Loop - n/a ? ?Sleep Study -  Yes ?CPAP - does not use CPAP ? ?Anesthesia review: Yes ? ?STOP now taking any Aspirin (unless otherwise instructed by your surgeon), Aleve, Naproxen, Ibuprofen, Motrin, Advil, Goody's, BC's, all herbal medications, fish oil, and all vitamins.  ? ?Coronavirus Screening ?Covid test is scheduled on  ?Do you have any of the following symptoms:  ?Cough yes/no: No ?Fever (>100.81F)  yes/no: No ?Runny nose yes/no: No ?Sore throat yes/no: No ?Difficulty breathing/shortness of breath  yes/no: No ? ?Have you traveled in the last 14 days and where? yes/no: No ? ?Patient verbalized understanding of instructions that were given via phone. ?

## 2021-10-16 NOTE — Progress Notes (Signed)
Anesthesia Chart Review: SAME DAY WORK-UP ? Case: [id number] Date/Time: 10/17/21 1100  ? Procedure: MINOR REMOVAL OF PLEURAL DRAINAGE CATHETER  ? Anesthesia type: Moderate Sedation  ? Pre-op diagnosis: resolved plural effusion  ? Location: [facility] RO[facility] / [facility]  ? Surgeons: [name], MD  ? ?  ? ? ?DISCUSSION: Patient is an 81 year old female scheduled for the above procedure. She is s/p insertion of a left pleural drainage catheter on 06/15/21 for a malignant pleural effusion.  Last follow-up with Dr. Roxan Hockey was 10/03/2021.  She had had minimal drainage from her Pleurx for the last 5 ot 6 drainage attempts dating back to nearly December.  Recent chest CT had shown small amount of loculated fluid at the left base.  Removal of the Pleurx catheter recommended.  Patient was quite concerned about pain with removal, so case was scheduled to be done with IV sedation/MAC anesthesia planned. ? ?History includes former smoker (quit 06/01/96), COPD (home O2 x ~ 7 years, 2L/[place]), asthma, HTN, HLD, pre-diabetes, OSA (does not use CPAP), chronic diastolic CHF, PAF (diagnosed 09/2017 in setting of GI bleed/AVMs, not felt to be a candidate for anticoagulation), GI bleed (recurrent 04/2020, likely from cecal AVMs, s/p PRBC; 09/2021, s/p 2 units PRBCs, no acute findings on CT), tachy-brady syndrome (with bradycardia; she declined PPM 02/03/18), stage IV adenocarcinoma (+TTF-1, consistent with lung primary per cytology 05/04/21), malignant left pleural effusion (s/p left thoracentesis 05/04/21, 06/08/21; s/p left Pleurx catheter 06/15/21), CKD (with right kidney mass, RCC suspected 07/2020, observation per Dr. Tresa Moore), MGUS, GERD, ulcerative colitis, incarcerated ventral hernia (admission 08/09/20-08/19/20, s/p exploratory laparotomy, resection of mesenteric mass, partial colectomy, end colostomy 08/11/20; pathology: 3 colonic tubular adenomas, no high grade dysplasia or carcinoma, inflamed granulation tissue consistent with  incarcerated hernia). ? ?[facility] admission 10/09/21-10/11/21 for hematochezia and melena. ?Occasional blood or melena in colostomy bag with chronic iron deficiency anemia felt likely due to AVMs.  She had been receiving periodic transfusions per oncology.  2 weeks prior admission she noticed an increase in the rate of bleeding as well as some occasional abdominal cramping.  Hgb 6.6 which prompted her oncologist to refer her to the ER for further evaluation and admission.  GI and general surgery consulted.  Given her history of bowel incarceration, CT was done which showed LLQ colostomy with associated large ventral abdominal wall hernias containing nondilated loops of small and large bowel, but no acute findings.  She had unsuccessful attempt at colonoscopy in October 2021 due to severe intrinsic stenosis in the transverse colon from a nonreducible periumbilical hernia and ultimately underwent partial colectomy and end colonoscopy in January 2022.  At that time given her history and comorbidities, GI did not recommend further endoscopic interventions and advised maximal supportive care.  General surgery did not think she required urgent/emergent surgery, but patient had also declined going through another abdominal surgery then. H/H improved post-transfusion and discharged home with PCP and oncology follow-up.  ? ?Last oncology visit with Dr. Julien Nordmann was 10/12/21. HGB up to 9.4. She was tolerating Tagrisso fairly well since 06/21/21. Continue oral iron for anemia. Follow-up in one month with CT scans. ? ?She is a same-day work-up.  Anesthesia team to evaluate on the day of surgery.  She had labs on 10/11/2021. ? ? ?VS:  ?BP Readings from Last 3 Encounters:  ?10/12/21 (!) 153/48  ?10/11/21 (!) 153/42  ?10/03/21 (!) 139/47  ? ?Pulse Readings from Last 3 Encounters:  ?10/12/21 (!) 52  ?10/11/21 60  ?  10/03/21 (!) 57  ?  ? ?PROVIDERS: ?[name], MD is PCP ?- Donne Anon, MD is HEM-ONC ?- Thompson Grayer, MD is  EP cardiologist. Last evaluation 01/23/21 by Tommye Standard, PA-C for follow-up and preoperative evaluation for colostomy takedown. A Lexiscan stress test was planned and well as ZioAT to assess for bradycardia/PAF burden. Zio showed no afib with brief SVT, no significant pauses or sustained bradycardia. Patient cancelled stress test because she was not sure she was going to proceed with surgery. She did not end up having colostomy takedown.  ?- Larae Grooms, MD is primary cardiologist  ?- Alexis Frock, MD is urologist  ? ? ?LABS: Most recent lab results include: ?Lab Results  ?Component Value Date  ? WBC 6.5 10/11/2021  ? HGB 9.4 (L) 10/11/2021  ? HCT 30.4 (L) 10/11/2021  ? PLT 118 (L) 10/11/2021  ? GLUCOSE 98 10/11/2021  ? ALT <5 10/09/2021  ? AST 11 (L) 10/09/2021  ? NA 139 10/11/2021  ? K 4.5 10/11/2021  ? CL 105 10/11/2021  ? CREATININE 1.51 (H) 10/11/2021  ? BUN 52 (H) 10/11/2021  ? CO2 28 10/11/2021  ? INR 1.1 10/09/2021  ? ? ? ?IMAGES: ?CT Abd/pelvis 10/10/21: ?IMPRESSION: ?1. No acute findings in the abdomen pelvis. ?2. Small loculated left pleural effusion is unchanged in size when ?compared with most recent prior exam. ?3. Left lower quadrant colostomy with associated large ventral ?abdominal wall hernias containing nondilated loops of small and ?large bowel. ?4. Heterogeneous renal mass of the mid region of the right kidney is ?unchanged in size when compared with prior exam and remains ?concerning for renal cell carcinoma. ?5.  Aortic Atherosclerosis (ICD10-I70.0). ?  ?CXR 10/03/21: ?IMPRESSION: ?1. Interval reduction in loculated left pleural effusion. ?2. Left pleural catheter in place. ?  ?MRI Brain 09/09/21: ?IMPRESSION: ?Focus abnormal signal on the prior study has resolved and was likely ?ischemic. There is no evidence of intracranial metastatic disease. ? ?PET Scan 06/07/21: ?IMPRESSION: ?1. Widespread metastatic disease, including to thoracic nodes, left ?pleural space, abdomen/pelvis, bones.  The primary or primaries are ?likely within the compressed left lung, as detailed above. ?2. Incidental findings, including: Moderate left pleural effusion. ?Coronary artery atherosclerosis. Aortic Atherosclerosis ?(ICD10-I70.0). Pulmonary artery enlargement suggests pulmonary ?arterial hypertension. ?   ?  ?EKG: 10/09/21: ?Sinus rhythm ?Atrial premature complex ?Probable left ventricular hypertrophy ?Confirmed by Veatrice Kells [phone]) on 10/10/2021 10:19:31 AM ?  ?  ?CV: ?Long term monitor 01/28/21-02/10/21: ?Study Highlights ?Normal sinus rhythm with rare PACs and PVCs. ?Brief runs of SVT associated with symptoms. ?No sustained arrhtyhmias. ?  ?  ?Echo 05/29/20: ?IMPRESSIONS  ? 1. Left ventricular ejection fraction, by estimation, is 60 to 65%. The  ?left ventricle has normal function. The left ventricle has no regional  ?wall motion abnormalities. There is moderate concentric left ventricular  ?hypertrophy. Left ventricular  ?diastolic parameters are consistent with Grade II diastolic dysfunction  ?(pseudonormalization).  ? 2. Right ventricular systolic function is normal. The right ventricular  ?size is normal.  ? 3. Left atrial size was mildly dilated.  ? 4. The mitral valve is normal in structure. Trivial mitral valve  ?regurgitation.  ? 5. The aortic valve is tricuspid. There is mild calcification of the  ?aortic valve. There is mild thickening of the aortic valve. Aortic valve  ?regurgitation is trivial. Mild aortic valve sclerosis is present, with no  ?evidence of aortic valve stenosis.  ? 6. The inferior vena cava is normal in size with greater than  50%  ?respiratory variability, suggesting right atrial pressure of 3 mmHg.  ?Comparison(s): No significant change from prior study.  ?  ?  ?Nuclear stress test 04/26/10: ?Overall Impression: Normal stress nuclear study. ? ?Past Medical History:  ?Diagnosis Date  ? Allergy   ? Angiodysplasia of cecum   ? Angiodysplasia of duodenum   ? Arthritis   ? Asteatotic eczema  02/25/2016  ? Asthma   ? Atrial fibrillation ([place]) 10/17/2017  ? Blood transfusion without reported diagnosis   ? Cancer [facility])   ? Cataract   ? CHF (congestive heart failure), NYHA class III, chronic, co

## 2021-10-16 NOTE — Anesthesia Preprocedure Evaluation (Deleted)
Anesthesia Evaluation  ? ? ?Airway ? ? ? ? ? ? ? Dental ?  ?Pulmonary ?former smoker,  ?  ? ? ? ? ? ? ? Cardiovascular ?hypertension,  ? ? ?  ?Neuro/Psych ?  ? GI/Hepatic ?  ?Endo/Other  ? ? Renal/GU ?  ? ?  ?Musculoskeletal ? ? Abdominal ?  ?Peds ? Hematology ?  ?Anesthesia Other Findings ? ? Reproductive/Obstetrics ? ?  ? ? ? ? ? ? ? ? ? ? ? ? ? ?  ?  ? ? ? ? ? ? ? ? ?Anesthesia Physical ?Anesthesia Plan ? ?ASA:  ? ?Anesthesia Plan:   ? ?Post-op Pain Management:   ? ?Induction:  ? ?PONV Risk Score and Plan:  ? ?Airway Management Planned:  ? ?Additional Equipment:  ? ?Intra-op Plan:  ? ?Post-operative Plan:  ? ?Informed Consent:  ? ?Plan Discussed with:  ? ?Anesthesia Plan Comments: (PAT note written 10/16/2021 by Myra Gianotti, PA-C. ?)  ? ? ? ? ? ? ?Anesthesia Quick Evaluation ? ?

## 2021-10-17 ENCOUNTER — Encounter (HOSPITAL_COMMUNITY): Payer: Self-pay | Admitting: Thoracic Surgery (Cardiothoracic Vascular Surgery)

## 2021-10-17 ENCOUNTER — Ambulatory Visit (HOSPITAL_COMMUNITY)
Admission: RE | Admit: 2021-10-17 | Discharge: 2021-10-17 | Disposition: A | Discharge status: Home / Self Care | Payer: Medicare Other | Attending: Thoracic Surgery (Cardiothoracic Vascular Surgery) | Admitting: Thoracic Surgery (Cardiothoracic Vascular Surgery)

## 2021-10-17 ENCOUNTER — Other Ambulatory Visit: Payer: Self-pay

## 2021-10-17 ENCOUNTER — Encounter (HOSPITAL_COMMUNITY)
Admission: RE | Disposition: A | Discharge status: Home / Self Care | Payer: Self-pay | Source: Home / Self Care | Attending: Thoracic Surgery (Cardiothoracic Vascular Surgery)

## 2021-10-17 DIAGNOSIS — J910 Malignant pleural effusion: Secondary | ICD-10-CM | POA: Diagnosis not present

## 2021-10-17 DIAGNOSIS — I110 Hypertensive heart disease with heart failure: Secondary | ICD-10-CM | POA: Insufficient documentation

## 2021-10-17 DIAGNOSIS — C3490 Malignant neoplasm of unspecified part of unspecified bronchus or lung: Secondary | ICD-10-CM | POA: Diagnosis not present

## 2021-10-17 DIAGNOSIS — Z4682 Encounter for fitting and adjustment of non-vascular catheter: Secondary | ICD-10-CM | POA: Insufficient documentation

## 2021-10-17 DIAGNOSIS — Z7951 Long term (current) use of inhaled steroids: Secondary | ICD-10-CM | POA: Diagnosis not present

## 2021-10-17 DIAGNOSIS — J449 Chronic obstructive pulmonary disease, unspecified: Secondary | ICD-10-CM | POA: Insufficient documentation

## 2021-10-17 DIAGNOSIS — Z6831 Body mass index (BMI) 31.0-31.9, adult: Secondary | ICD-10-CM | POA: Insufficient documentation

## 2021-10-17 DIAGNOSIS — J90 Pleural effusion, not elsewhere classified: Secondary | ICD-10-CM

## 2021-10-17 DIAGNOSIS — I4891 Unspecified atrial fibrillation: Secondary | ICD-10-CM | POA: Diagnosis not present

## 2021-10-17 DIAGNOSIS — Z79899 Other long term (current) drug therapy: Secondary | ICD-10-CM | POA: Diagnosis not present

## 2021-10-17 DIAGNOSIS — G4733 Obstructive sleep apnea (adult) (pediatric): Secondary | ICD-10-CM | POA: Insufficient documentation

## 2021-10-17 DIAGNOSIS — E785 Hyperlipidemia, unspecified: Secondary | ICD-10-CM | POA: Diagnosis not present

## 2021-10-17 DIAGNOSIS — I509 Heart failure, unspecified: Secondary | ICD-10-CM | POA: Diagnosis not present

## 2021-10-17 HISTORY — DX: Other specified health status: Z78.9

## 2021-10-17 HISTORY — DX: Dependence on supplemental oxygen: Z99.81

## 2021-10-17 HISTORY — PX: REMOVAL OF PLEURAL DRAINAGE CATHETER: SHX5080

## 2021-10-17 SURGERY — MINOR REMOVAL OF PLEURAL DRAINAGE CATHETER
Anesthesia: Moderate Sedation

## 2021-10-17 MED ORDER — MORPHINE SULFATE (PF) 4 MG/ML IV SOLN
INTRAVENOUS | Status: DC | PRN
  Administered 2021-10-17: 2 mg via INTRAVENOUS

## 2021-10-17 MED ORDER — LIDOCAINE HCL 1 % IJ SOLN
INTRAMUSCULAR | Status: DC | PRN
  Administered 2021-10-17: 15 mL via INTRADERMAL

## 2021-10-17 NOTE — Interval H&P Note (Signed)
History and Physical Interval Note: ? ?10/17/2021 ?5:02 PM ? ?[name]  has presented today for surgery, with the diagnosis of resolved plural effusion.  The various methods of treatment have been discussed with the patient and family. After consideration of risks, benefits and other options for treatment, the patient has consented to  Procedure(s): ?MINOR REMOVAL OF PLEURAL DRAINAGE CATHETER (N/A) as a surgical intervention.  The patient's history has been reviewed, patient examined, no change in status, stable for surgery.  I have reviewed the patient's chart and labs.  Questions were answered to the patient's satisfaction.   ? ? ?[name] ? ? ?

## 2021-10-17 NOTE — Discharge Instructions (Signed)
You may shower tomorrow.  Please wash incision with soap and water daily and cover with clean dry dressing ? ?You may take home pain medications as prescribed, some mild discomfort is expected. ? ?Contact our office should you have questions or concerns at [phone] ?

## 2021-10-17 NOTE — Procedures (Signed)
? ?   ?  [address] ?      [name] [address] ?            [phone]   ? ?  ? ? ?[name] ?[id number] ?1941/03/21 ? ? ?Pre Procedure Diagnosis: Left Malignant Pleural Effusion ?Post Procedure Diagnosis: Left Malignant Pleural Effusion ? ? ?Procedure: Removal of Pleur-x Catheter ? ?Time out was performed.  The patient's left side was identified.  The area was prepped and draped in sterile fashion.  Local anesthesia was applied with 15 ml of 1% Lidocaine.  Once anesthesia was achieved blunt dissection was performed with lysis of skin adhesions.  Once catheter was freed up it was removed.  Pressure was applied for minor skin oozing and the skin edges were approximated with 2 interrupted 3-0 Nylon sutures.  The patient tolerated the procedure with minimal difficulty. ? ?She did receive of 4 mg IV Morphine prior to the procedure ? ? ?Blood Loss: Minimal ? ?Clean dressing was applied, she will follow up in our office in 1 week. ? ? ?Ellwood Handler, PA-C ?10/17/21 ?11:47 AM ? ?

## 2021-10-18 ENCOUNTER — Encounter (HOSPITAL_COMMUNITY): Payer: Self-pay | Admitting: Thoracic Surgery (Cardiothoracic Vascular Surgery)

## 2021-10-18 ENCOUNTER — Telehealth: Payer: Self-pay

## 2021-10-18 NOTE — Progress Notes (Signed)
? ? ?Chronic Care Management ?Pharmacy Assistant  ? ?Name: KUUIPO ANZALDO  MRN: [id number] DOB: 25-Apr-1941 ? ?[name] is an 81 y.o. year old female who presents for his follow-up CCM visit with the clinical pharmacist. ? ?Reason for Encounter: Disease State ?  ?Conditions to be addressed/monitored: ?HTN ? ?Recent office visits:  ?None ID ? ?Recent consult visits:  ?10/12/21 Curt Bears, MD-Oncology (Malignant neoplasm of unspecified part of unspecified bronchus or lung) Labs ordered: CBC, CMP, CT chest w contrast, no med changes ? ?10/03/21 [name], MD-Cardiothoracic Surgery (Recurrent pleural effusion) No orders or med changes ?Hospital visits:  ?Medication Reconciliation was completed by comparing discharge summary, patient?s EMR and Pharmacy list, and upon discussion with patient. ? ?Admitted to the hospital on 10/17/21 due to Pleural effusion. Discharge date was 10/17/21. Discharged from [facility].   ? ? ?Medications that remain the same after Hospital Discharge:??  ?-All other medications will remain the same. ?   ?Admitted to the hospital on 10/09/21 due to Gastrointestinal hemorrhage. Discharge date was 10/11/21. Discharged from [facility].   ? ?Medications that remain the same after Hospital Discharge:??  ?-All other medications will remain the same. ?Medications: ?Outpatient Encounter Medications as of 10/18/2021  ?Medication Sig Note  ? acetaminophen (TYLENOL) 325 MG tablet Take 325-650 mg by mouth daily as needed for moderate pain, fever or headache.    ? albuterol (PROAIR HFA) 108 (90 Base) MCG/ACT inhaler Inhale 1-2 puffs into the lungs every 6 (six) hours as needed for wheezing or shortness of breath.   ? allopurinol (ZYLOPRIM) 100 MG tablet TAKE 1 TABLET(100 MG) BY MOUTH DAILY (Patient taking differently: Take 100 mg by mouth daily.)   ? clonazePAM (KLONOPIN) 1 MG tablet Take 1 tablet (1 mg total) by mouth 2 (two) times daily as needed for  anxiety. (Patient taking differently: Take 0.5-1 mg by mouth 2 (two) times daily as needed for anxiety.)   ? ferrous sulfate 325 (65 FE) MG tablet Take 1 tablet (325 mg total) by mouth 2 (two) times daily with a meal.   ? fluticasone furoate-vilanterol (BREO ELLIPTA) 100-25 MCG/ACT AEPB Inhale 1 puff into the lungs daily.   ? isosorbide-hydrALAZINE (BIDIL) 20-37.5 MG tablet TAKE 1 TABLET BY MOUTH THREE TIMES DAILY (Patient taking differently: Take 1 tablet by mouth in the morning and at bedtime.)   ? metoprolol tartrate (LOPRESSOR) 25 MG tablet TAKE 1 TABLET(25 MG) BY MOUTH TWICE DAILY (Patient taking differently: Take 25 mg by mouth in the morning and at bedtime.)   ? morphine (MS CONTIN) 15 MG 12 hr tablet Take 1 tablet (15 mg total) by mouth 2 (two) times daily as needed for pain. 10/09/2021: The patient takes this rarely (her words)  ? Nutritional Supplements (CARNATION BREAKFAST ESSENTIALS) LIQD Take 237 mLs by mouth daily.   ? osimertinib mesylate (TAGRISSO) 80 MG tablet Take 1 tablet (80 mg total) by mouth daily.   ? OXYGEN Inhale 2 L/min into the lungs continuous.   ? prochlorperazine (COMPAZINE) 10 MG tablet Take 1 tablet (10 mg total) by mouth every 6 (six) hours as needed. (Patient taking differently: Take 10 mg by mouth every 6 (six) hours as needed for nausea or vomiting.)   ? protein supplement shake (PREMIER PROTEIN) LIQD Take 11 oz by mouth 2 (two) times daily between meals.   ? torsemide (DEMADEX) 20 MG tablet TAKE 2 TABLETS(40 MG) BY MOUTH EVERY MORNING (Patient taking differently: Take 40 mg by  mouth in the morning.)   ? ?Facility-Administered Encounter Medications as of 10/18/2021  ?Medication  ? cyanocobalamin ((VITAMIN B-12)) injection 1,000 mcg  ? ?Reviewed chart prior to disease state call. Spoke with patient regarding BP ? ?Recent Office Vitals: ?BP Readings from Last 3 Encounters:  ?10/17/21 (!) 133/44  ?10/12/21 (!) 153/48  ?10/11/21 (!) 153/42  ? ?Pulse Readings from Last 3 Encounters:   ?10/17/21 (!) 58  ?10/12/21 (!) 52  ?10/11/21 60  ?  ?Wt Readings from Last 3 Encounters:  ?10/16/21 162 lb (73.5 kg)  ?10/09/21 162 lb 9.6 oz (73.8 kg)  ?10/03/21 162 lb 9.6 oz (73.8 kg)  ?  ? ?Kidney Function ?Lab Results  ?Component Value Date/Time  ? CREATININE 1.51 (H) 10/11/2021 07:54 AM  ? CREATININE 2.15 (H) 10/10/2021 12:36 AM  ? CREATININE 1.98 (H) 10/09/2021 08:59 AM  ? CREATININE 1.70 (H) 09/12/2021 03:30 PM  ? CREATININE 1.36 (H) 02/04/2020 02:43 PM  ? CREATININE 1.1 04/11/2016 10:07 AM  ? CREATININE 1.5 (H) 04/05/2015 01:02 PM  ? GFR 43.97 (L) 05/16/2021 02:42 PM  ? GFRNONAA 35 (L) 10/11/2021 07:54 AM  ? GFRNONAA 25 (L) 10/09/2021 08:59 AM  ? GFRAA 30 (L) 02/19/2020 01:59 PM  ? ? ? ?  Latest Ref Rng & Units 10/11/2021  ?  7:54 AM 10/10/2021  ? 12:36 AM 10/09/2021  ? 12:24 PM  ?BMP  ?Glucose 70 - 99 mg/dL 98   91   114    ?BUN 8 - 23 mg/dL 52   72   74    ?Creatinine 0.44 - 1.00 mg/dL 1.51   2.15   1.98    ?Sodium 135 - 145 mmol/L 139   134   137    ?Potassium 3.5 - 5.1 mmol/L 4.5   3.8   4.7    ?Chloride 98 - 111 mmol/L 105   101   102    ?CO2 22 - 32 mmol/L 28   26   28     ?Calcium 8.9 - 10.3 mg/dL 9.8   9.1   9.8    ? ? ?Current antihypertensive regimen:  ?Isosorbide-hctz 20-37.5 mg ?Metoprolol tartrate 25 mg ? ?How often are you checking your Blood Pressure?  Patient states that her husband has a blood pressure cuff but she does not use because she states that it does not give a good reading ? ?Current home BP readings: Patient last blood pressure reading was on 10/17/21 at [facility] which was 143/51 ? ?What recent interventions/DTPs have been made by any provider to improve Blood Pressure control since last CPP Visit: None noted since last coordination call ? ?Any recent hospitalizations or ED visits since last visit with CPP? Yes, for symptomatic anemia on 10/09/21 and 10/17/21 for removal of catheter ? ?What diet changes have been made to improve Blood Pressure Control?  ?Patient states that she does  not eat much anymore, just does not have an appetite, but has not changed anything in her diet ? ?What exercise is being done to improve your Blood Pressure Control?  ?Patient states that she does not exercise at all. She states that she has a hard time just walking to the bathroom ? ?Adherence Review: ?Is the patient currently on ACE/ARB medication? No ?Does the patient have >5 day gap between last estimated fill dates? Yes ? ? ?Care Gaps: ?Colonoscopy-NA ?Diabetic Foot Exam-11/05/17 ?Mammogram-NA ?Ophthalmology-09/23/18 ?Dexa Scan - NA ?Annual Well Visit - NA ?Micro albumin-11/05/17 ?Hemoglobin A1c- 11/05/17 ?  ?Star Rating  Drugs: ?None ID ? ?[name] ?Clinical Pharmacist Assistant ?[phone]  ?

## 2021-10-20 ENCOUNTER — Other Ambulatory Visit: Payer: Self-pay | Admitting: Thoracic Surgery (Cardiothoracic Vascular Surgery)

## 2021-10-20 DIAGNOSIS — C3492 Malignant neoplasm of unspecified part of left bronchus or lung: Secondary | ICD-10-CM

## 2021-10-24 ENCOUNTER — Telehealth: Payer: Self-pay

## 2021-10-24 ENCOUNTER — Ambulatory Visit
Admission: RE | Admit: 2021-10-24 | Discharge: 2021-10-24 | Disposition: A | Discharge status: Home / Self Care | Payer: Medicare Other | Source: Ambulatory Visit | Attending: Thoracic Surgery (Cardiothoracic Vascular Surgery) | Admitting: Thoracic Surgery (Cardiothoracic Vascular Surgery)

## 2021-10-24 ENCOUNTER — Other Ambulatory Visit: Payer: Self-pay

## 2021-10-24 ENCOUNTER — Ambulatory Visit: Payer: Medicare Other | Admitting: Physician Assistant

## 2021-10-24 VITALS — BP 147/66 | HR 51 | Resp 20 | Ht 60.0 in | Wt 161.4 lb

## 2021-10-24 DIAGNOSIS — M47814 Spondylosis without myelopathy or radiculopathy, thoracic region: Secondary | ICD-10-CM | POA: Diagnosis not present

## 2021-10-24 DIAGNOSIS — J90 Pleural effusion, not elsewhere classified: Secondary | ICD-10-CM

## 2021-10-24 DIAGNOSIS — J9811 Atelectasis: Secondary | ICD-10-CM | POA: Diagnosis not present

## 2021-10-24 DIAGNOSIS — C3492 Malignant neoplasm of unspecified part of left bronchus or lung: Secondary | ICD-10-CM

## 2021-10-24 DIAGNOSIS — C3490 Malignant neoplasm of unspecified part of unspecified bronchus or lung: Secondary | ICD-10-CM | POA: Diagnosis not present

## 2021-10-24 MED ORDER — NYSTATIN 100000 UNIT/GM EX POWD: 1.0000 "application " | Freq: Three times a day (TID) | CUTANEOUS | 0 refills | Status: AC

## 2021-10-24 NOTE — Progress Notes (Signed)
? ?   ?[address] ?      [name] [address] ?            [phone]   ? ?  ? ?HPI: ? ?Patient presents for suture removal and follow up CXR.  She has a history of Stage IV lung cancer with malignant pleural effusions.  She underwent removal of Pleur-x catheter on 10/17/2021.  Since catheter removal patient is doing okay.  She is not happy with how her procedure was handled.  It was her understanding she was going to be asleep for the procedure, and if we weren't going to do this why couldn't the removal happen in the office.  She states her pain that was initially present has resolved.  Her yeast infection under her breast persists.  She was worried about getting powder in her incision.  She does need a refill on her powder.  ? ?Current Outpatient Medications  ?Medication Sig Dispense Refill  ? acetaminophen (TYLENOL) 325 MG tablet Take 325-650 mg by mouth daily as needed for moderate pain, fever or headache.     ? albuterol (PROAIR HFA) 108 (90 Base) MCG/ACT inhaler Inhale 1-2 puffs into the lungs every 6 (six) hours as needed for wheezing or shortness of breath. 18 g 11  ? allopurinol (ZYLOPRIM) 100 MG tablet TAKE 1 TABLET(100 MG) BY MOUTH DAILY (Patient taking differently: Take 100 mg by mouth daily.) 90 tablet 1  ? clonazePAM (KLONOPIN) 1 MG tablet Take 1 tablet (1 mg total) by mouth 2 (two) times daily as needed for anxiety. (Patient taking differently: Take 0.5-1 mg by mouth 2 (two) times daily as needed for anxiety.) 180 tablet 0  ? ferrous sulfate 325 (65 FE) MG tablet Take 1 tablet (325 mg total) by mouth 2 (two) times daily with a meal. 180 tablet 1  ? fluticasone furoate-vilanterol (BREO ELLIPTA) 100-25 MCG/ACT AEPB Inhale 1 puff into the lungs daily. 120 each 1  ? isosorbide-hydrALAZINE (BIDIL) 20-37.5 MG tablet TAKE 1 TABLET BY MOUTH THREE TIMES DAILY (Patient taking differently: Take 1 tablet by mouth in the morning and at bedtime.) 270 tablet 0  ? metoprolol tartrate (LOPRESSOR)  25 MG tablet TAKE 1 TABLET(25 MG) BY MOUTH TWICE DAILY (Patient taking differently: Take 25 mg by mouth in the morning and at bedtime.) 180 tablet 1  ? morphine (MS CONTIN) 15 MG 12 hr tablet Take 1 tablet (15 mg total) by mouth 2 (two) times daily as needed for pain. 60 tablet 0  ? Nutritional Supplements (CARNATION BREAKFAST ESSENTIALS) LIQD Take 237 mLs by mouth daily.    ? osimertinib mesylate (TAGRISSO) 80 MG tablet Take 1 tablet (80 mg total) by mouth daily. 30 tablet 3  ? OXYGEN Inhale 2 L/min into the lungs continuous.    ? prochlorperazine (COMPAZINE) 10 MG tablet Take 1 tablet (10 mg total) by mouth every 6 (six) hours as needed. (Patient taking differently: Take 10 mg by mouth every 6 (six) hours as needed for nausea or vomiting.) 30 tablet 2  ? protein supplement shake (PREMIER PROTEIN) LIQD Take 11 oz by mouth 2 (two) times daily between meals.    ? torsemide (DEMADEX) 20 MG tablet TAKE 2 TABLETS(40 MG) BY MOUTH EVERY MORNING (Patient taking differently: Take 40 mg by mouth in the morning.) 180 tablet 0  ? ?Current Facility-Administered Medications  ?Medication Dose Route Frequency Provider Last Rate Last Admin  ? cyanocobalamin ((VITAMIN B-12)) injection 1,000 mcg  1,000 mcg Intramuscular Q30 days  [name], MD   1,000 mcg at 01/31/21 1507  ? ? ?Physical Exam: ? ?BP (!) 147/66 (BP Location: Left Arm, Patient Position: Sitting)   Pulse (!) 51   Resp 20   Ht 5' (1.524 m)   Wt 161 lb 6.4 oz (73.2 kg)   SpO2 96% Comment: RA  BMI 31.52 kg/m?  ? ?Gen: NAD ?Heart: RRR ?Lungs: mildly diminished left base ? ?Diagnostic Tests: ? ?CXR: interval removal of chest tube, no pneumothorax present, small residual left pleural effusion, chronic ? ?A/P: ? ?S/P Removal of Pleur-x catheter for resolved malignant pleural effusion, CXR w/o pneumothorax or re-accumulation of pleural fluid ?Left Breast yeast infection- will order nystatin powder refill for patient ?RTC prn ? ? ?Ellwood Handler, PA-C ?[facility] ?[phone] ? ?

## 2021-10-24 NOTE — Telephone Encounter (Signed)
Spoke with Gradndaughter, Lauren to schedule follow up appt with palliative care. Lauren will get patient to cal [place] back to schedule a follow up visit. ?

## 2021-10-26 DIAGNOSIS — I5042 Chronic combined systolic (congestive) and diastolic (congestive) heart failure: Secondary | ICD-10-CM | POA: Diagnosis not present

## 2021-10-26 DIAGNOSIS — M19072 Primary osteoarthritis, left ankle and foot: Secondary | ICD-10-CM | POA: Diagnosis not present

## 2021-10-26 DIAGNOSIS — I700 Atherosclerosis of aorta: Secondary | ICD-10-CM | POA: Diagnosis not present

## 2021-10-26 DIAGNOSIS — C3490 Malignant neoplasm of unspecified part of unspecified bronchus or lung: Secondary | ICD-10-CM | POA: Diagnosis not present

## 2021-10-26 DIAGNOSIS — D631 Anemia in chronic kidney disease: Secondary | ICD-10-CM | POA: Diagnosis not present

## 2021-10-26 DIAGNOSIS — I130 Hypertensive heart and chronic kidney disease with heart failure and stage 1 through stage 4 chronic kidney disease, or unspecified chronic kidney disease: Secondary | ICD-10-CM | POA: Diagnosis not present

## 2021-10-26 DIAGNOSIS — N1832 Chronic kidney disease, stage 3b: Secondary | ICD-10-CM | POA: Diagnosis not present

## 2021-10-26 DIAGNOSIS — D630 Anemia in neoplastic disease: Secondary | ICD-10-CM | POA: Diagnosis not present

## 2021-10-26 DIAGNOSIS — M1030 Gout due to renal impairment, unspecified site: Secondary | ICD-10-CM | POA: Diagnosis not present

## 2021-10-26 DIAGNOSIS — M479 Spondylosis, unspecified: Secondary | ICD-10-CM | POA: Diagnosis not present

## 2021-10-26 DIAGNOSIS — D519 Vitamin B12 deficiency anemia, unspecified: Secondary | ICD-10-CM | POA: Diagnosis not present

## 2021-10-26 DIAGNOSIS — Z433 Encounter for attention to colostomy: Secondary | ICD-10-CM | POA: Diagnosis not present

## 2021-10-26 DIAGNOSIS — M810 Age-related osteoporosis without current pathological fracture: Secondary | ICD-10-CM | POA: Diagnosis not present

## 2021-10-26 DIAGNOSIS — Z4801 Encounter for change or removal of surgical wound dressing: Secondary | ICD-10-CM | POA: Diagnosis not present

## 2021-10-26 DIAGNOSIS — H539 Unspecified visual disturbance: Secondary | ICD-10-CM | POA: Diagnosis not present

## 2021-10-26 DIAGNOSIS — M19071 Primary osteoarthritis, right ankle and foot: Secondary | ICD-10-CM | POA: Diagnosis not present

## 2021-10-26 DIAGNOSIS — I2781 Cor pulmonale (chronic): Secondary | ICD-10-CM | POA: Diagnosis not present

## 2021-10-26 DIAGNOSIS — Z48813 Encounter for surgical aftercare following surgery on the respiratory system: Secondary | ICD-10-CM | POA: Diagnosis not present

## 2021-10-26 DIAGNOSIS — D472 Monoclonal gammopathy: Secondary | ICD-10-CM | POA: Diagnosis not present

## 2021-10-26 DIAGNOSIS — G4733 Obstructive sleep apnea (adult) (pediatric): Secondary | ICD-10-CM | POA: Diagnosis not present

## 2021-10-26 DIAGNOSIS — I878 Other specified disorders of veins: Secondary | ICD-10-CM | POA: Diagnosis not present

## 2021-10-26 DIAGNOSIS — E785 Hyperlipidemia, unspecified: Secondary | ICD-10-CM | POA: Diagnosis not present

## 2021-10-26 DIAGNOSIS — M4802 Spinal stenosis, cervical region: Secondary | ICD-10-CM | POA: Diagnosis not present

## 2021-10-26 DIAGNOSIS — J449 Chronic obstructive pulmonary disease, unspecified: Secondary | ICD-10-CM | POA: Diagnosis not present

## 2021-10-26 DIAGNOSIS — J9621 Acute and chronic respiratory failure with hypoxia: Secondary | ICD-10-CM | POA: Diagnosis not present

## 2021-10-27 ENCOUNTER — Telehealth: Payer: Self-pay

## 2021-10-27 ENCOUNTER — Other Ambulatory Visit: Payer: Self-pay

## 2021-10-27 DIAGNOSIS — C3492 Malignant neoplasm of unspecified part of left bronchus or lung: Secondary | ICD-10-CM

## 2021-10-27 NOTE — Telephone Encounter (Signed)
I have called the pt back to advise of this. I had to leave a message and I requested a return call. ?

## 2021-10-27 NOTE — Telephone Encounter (Signed)
Pt has been advised as indicated. Pt is scheduled for a lab appointment of Monday with an arrival time of 10:45am. ? ?

## 2021-10-27 NOTE — Telephone Encounter (Signed)
Pt ML stating she may need her blood count checked because she is experiencing significant bleeding. ? ?I have called the pt back to determine where she is bleed from. Pt states she has significant bleeding in her colostomy bag and was told by her GI there was nothing they can do. She stated is was offered to cauterized her capillaries but was not felt it would help. ?

## 2021-10-30 ENCOUNTER — Other Ambulatory Visit: Payer: Self-pay

## 2021-10-30 ENCOUNTER — Inpatient Hospital Stay: Payer: Medicare Other | Attending: Internal Medicine

## 2021-10-30 ENCOUNTER — Inpatient Hospital Stay: Payer: Medicare Other

## 2021-10-30 ENCOUNTER — Telehealth: Payer: Self-pay

## 2021-10-30 DIAGNOSIS — I110 Hypertensive heart disease with heart failure: Secondary | ICD-10-CM | POA: Diagnosis not present

## 2021-10-30 DIAGNOSIS — C775 Secondary and unspecified malignant neoplasm of intrapelvic lymph nodes: Secondary | ICD-10-CM | POA: Diagnosis not present

## 2021-10-30 DIAGNOSIS — C771 Secondary and unspecified malignant neoplasm of intrathoracic lymph nodes: Secondary | ICD-10-CM | POA: Diagnosis not present

## 2021-10-30 DIAGNOSIS — C3492 Malignant neoplasm of unspecified part of left bronchus or lung: Secondary | ICD-10-CM | POA: Diagnosis not present

## 2021-10-30 DIAGNOSIS — C772 Secondary and unspecified malignant neoplasm of intra-abdominal lymph nodes: Secondary | ICD-10-CM | POA: Insufficient documentation

## 2021-10-30 DIAGNOSIS — C782 Secondary malignant neoplasm of pleura: Secondary | ICD-10-CM | POA: Diagnosis not present

## 2021-10-30 DIAGNOSIS — C7951 Secondary malignant neoplasm of bone: Secondary | ICD-10-CM | POA: Diagnosis not present

## 2021-10-30 DIAGNOSIS — D500 Iron deficiency anemia secondary to blood loss (chronic): Secondary | ICD-10-CM | POA: Diagnosis not present

## 2021-10-30 DIAGNOSIS — I5042 Chronic combined systolic (congestive) and diastolic (congestive) heart failure: Secondary | ICD-10-CM | POA: Diagnosis not present

## 2021-10-30 LAB — CBC WITH DIFFERENTIAL (CANCER CENTER ONLY)
Abs Immature Granulocytes: 0.02 10*3/uL (ref 0.00–0.07)
Basophils Absolute: 0 10*3/uL (ref 0.0–0.1)
Basophils Relative: 1 %
Eosinophils Absolute: 0.1 10*3/uL (ref 0.0–0.5)
Eosinophils Relative: 2 %
HCT: 24.1 % — ABNORMAL LOW (ref 36.0–46.0)
Hemoglobin: 7.3 g/dL — ABNORMAL LOW (ref 12.0–15.0)
Immature Granulocytes: 0 %
Lymphocytes Relative: 14 %
Lymphs Abs: 0.9 10*3/uL (ref 0.7–4.0)
MCH: 26 pg (ref 26.0–34.0)
MCHC: 30.3 g/dL (ref 30.0–36.0)
MCV: 85.8 fL (ref 80.0–100.0)
Monocytes Absolute: 0.6 10*3/uL (ref 0.1–1.0)
Monocytes Relative: 9 %
Neutro Abs: 4.8 10*3/uL (ref 1.7–7.7)
Neutrophils Relative %: 74 %
Platelet Count: 142 10*3/uL — ABNORMAL LOW (ref 150–400)
RBC: 2.81 MIL/uL — ABNORMAL LOW (ref 3.87–5.11)
RDW: 16.1 % — ABNORMAL HIGH (ref 11.5–15.5)
WBC Count: 6.4 10*3/uL (ref 4.0–10.5)
nRBC: 0 % (ref 0.0–0.2)

## 2021-10-30 LAB — PREPARE RBC (CROSSMATCH)

## 2021-10-30 LAB — SAMPLE TO BLOOD BANK

## 2021-10-30 NOTE — Telephone Encounter (Signed)
Patient's HGB 7.3. Patient has agreed to go to [facility] on 10/31/21 @ 10:45 AM for 2 units of blood. Maggie RN aware. Pick-up slip faxed to [facility]. ?

## 2021-10-31 ENCOUNTER — Inpatient Hospital Stay: Payer: Medicare Other

## 2021-10-31 DIAGNOSIS — D500 Iron deficiency anemia secondary to blood loss (chronic): Secondary | ICD-10-CM | POA: Diagnosis not present

## 2021-10-31 DIAGNOSIS — I5042 Chronic combined systolic (congestive) and diastolic (congestive) heart failure: Secondary | ICD-10-CM | POA: Diagnosis not present

## 2021-10-31 DIAGNOSIS — C771 Secondary and unspecified malignant neoplasm of intrathoracic lymph nodes: Secondary | ICD-10-CM | POA: Diagnosis not present

## 2021-10-31 DIAGNOSIS — C3492 Malignant neoplasm of unspecified part of left bronchus or lung: Secondary | ICD-10-CM | POA: Diagnosis not present

## 2021-10-31 DIAGNOSIS — C7951 Secondary malignant neoplasm of bone: Secondary | ICD-10-CM | POA: Diagnosis not present

## 2021-10-31 DIAGNOSIS — C772 Secondary and unspecified malignant neoplasm of intra-abdominal lymph nodes: Secondary | ICD-10-CM | POA: Diagnosis not present

## 2021-10-31 DIAGNOSIS — C782 Secondary malignant neoplasm of pleura: Secondary | ICD-10-CM | POA: Diagnosis not present

## 2021-10-31 DIAGNOSIS — C775 Secondary and unspecified malignant neoplasm of intrapelvic lymph nodes: Secondary | ICD-10-CM | POA: Diagnosis not present

## 2021-10-31 DIAGNOSIS — I110 Hypertensive heart disease with heart failure: Secondary | ICD-10-CM | POA: Diagnosis not present

## 2021-10-31 MED ORDER — ACETAMINOPHEN 325 MG PO TABS
650.0000 mg | ORAL_TABLET | Freq: Once | ORAL | Status: AC
  Administered 2021-10-31: 650 mg via ORAL
  Filled 2021-10-31: qty 2

## 2021-10-31 MED ORDER — SODIUM CHLORIDE 0.9% IV SOLUTION
250.0000 mL | Freq: Once | INTRAVENOUS | Status: AC
  Administered 2021-10-31: 250 mL via INTRAVENOUS

## 2021-10-31 MED ORDER — DIPHENHYDRAMINE HCL 25 MG PO CAPS
25.0000 mg | ORAL_CAPSULE | Freq: Once | ORAL | Status: AC
  Administered 2021-10-31: 25 mg via ORAL
  Filled 2021-10-31: qty 1

## 2021-11-01 ENCOUNTER — Telehealth: Payer: Self-pay

## 2021-11-01 LAB — BPAM RBC
Blood Product Expiration Date: 202304222359
Blood Product Expiration Date: 202304262359
ISSUE DATE / TIME: 202304040804
ISSUE DATE / TIME: 202304040804
Unit Type and Rh: 9500
Unit Type and Rh: 9500

## 2021-11-01 LAB — TYPE AND SCREEN
ABO/RH(D): O POS
Antibody Screen: NEGATIVE
Donor AG Type: NEGATIVE
Donor AG Type: NEGATIVE
Unit division: 0
Unit division: 0

## 2021-11-01 NOTE — Telephone Encounter (Signed)
Attempted to contact patient's granddaughter Ander Purpura to schedule follow up appointment. No answer left a message to return call.  ?

## 2021-11-02 DIAGNOSIS — C3490 Malignant neoplasm of unspecified part of unspecified bronchus or lung: Secondary | ICD-10-CM | POA: Diagnosis not present

## 2021-11-02 DIAGNOSIS — H539 Unspecified visual disturbance: Secondary | ICD-10-CM | POA: Diagnosis not present

## 2021-11-02 DIAGNOSIS — I2781 Cor pulmonale (chronic): Secondary | ICD-10-CM | POA: Diagnosis not present

## 2021-11-02 DIAGNOSIS — I700 Atherosclerosis of aorta: Secondary | ICD-10-CM | POA: Diagnosis not present

## 2021-11-02 DIAGNOSIS — I130 Hypertensive heart and chronic kidney disease with heart failure and stage 1 through stage 4 chronic kidney disease, or unspecified chronic kidney disease: Secondary | ICD-10-CM | POA: Diagnosis not present

## 2021-11-02 DIAGNOSIS — I5042 Chronic combined systolic (congestive) and diastolic (congestive) heart failure: Secondary | ICD-10-CM | POA: Diagnosis not present

## 2021-11-02 DIAGNOSIS — Z4801 Encounter for change or removal of surgical wound dressing: Secondary | ICD-10-CM | POA: Diagnosis not present

## 2021-11-02 DIAGNOSIS — J9621 Acute and chronic respiratory failure with hypoxia: Secondary | ICD-10-CM | POA: Diagnosis not present

## 2021-11-02 DIAGNOSIS — M19071 Primary osteoarthritis, right ankle and foot: Secondary | ICD-10-CM | POA: Diagnosis not present

## 2021-11-02 DIAGNOSIS — M810 Age-related osteoporosis without current pathological fracture: Secondary | ICD-10-CM | POA: Diagnosis not present

## 2021-11-02 DIAGNOSIS — N1832 Chronic kidney disease, stage 3b: Secondary | ICD-10-CM | POA: Diagnosis not present

## 2021-11-02 DIAGNOSIS — M19072 Primary osteoarthritis, left ankle and foot: Secondary | ICD-10-CM | POA: Diagnosis not present

## 2021-11-02 DIAGNOSIS — D472 Monoclonal gammopathy: Secondary | ICD-10-CM | POA: Diagnosis not present

## 2021-11-02 DIAGNOSIS — M1030 Gout due to renal impairment, unspecified site: Secondary | ICD-10-CM | POA: Diagnosis not present

## 2021-11-02 DIAGNOSIS — I878 Other specified disorders of veins: Secondary | ICD-10-CM | POA: Diagnosis not present

## 2021-11-02 DIAGNOSIS — M479 Spondylosis, unspecified: Secondary | ICD-10-CM | POA: Diagnosis not present

## 2021-11-02 DIAGNOSIS — D631 Anemia in chronic kidney disease: Secondary | ICD-10-CM | POA: Diagnosis not present

## 2021-11-02 DIAGNOSIS — D519 Vitamin B12 deficiency anemia, unspecified: Secondary | ICD-10-CM | POA: Diagnosis not present

## 2021-11-02 DIAGNOSIS — E785 Hyperlipidemia, unspecified: Secondary | ICD-10-CM | POA: Diagnosis not present

## 2021-11-02 DIAGNOSIS — J449 Chronic obstructive pulmonary disease, unspecified: Secondary | ICD-10-CM | POA: Diagnosis not present

## 2021-11-02 DIAGNOSIS — Z48813 Encounter for surgical aftercare following surgery on the respiratory system: Secondary | ICD-10-CM | POA: Diagnosis not present

## 2021-11-02 DIAGNOSIS — Z433 Encounter for attention to colostomy: Secondary | ICD-10-CM | POA: Diagnosis not present

## 2021-11-02 DIAGNOSIS — M4802 Spinal stenosis, cervical region: Secondary | ICD-10-CM | POA: Diagnosis not present

## 2021-11-02 DIAGNOSIS — G4733 Obstructive sleep apnea (adult) (pediatric): Secondary | ICD-10-CM | POA: Diagnosis not present

## 2021-11-02 DIAGNOSIS — D630 Anemia in neoplastic disease: Secondary | ICD-10-CM | POA: Diagnosis not present

## 2021-11-03 ENCOUNTER — Encounter: Payer: Self-pay | Admitting: *Deleted

## 2021-11-07 DIAGNOSIS — Z433 Encounter for attention to colostomy: Secondary | ICD-10-CM | POA: Diagnosis not present

## 2021-11-07 DIAGNOSIS — D519 Vitamin B12 deficiency anemia, unspecified: Secondary | ICD-10-CM | POA: Diagnosis not present

## 2021-11-07 DIAGNOSIS — I5042 Chronic combined systolic (congestive) and diastolic (congestive) heart failure: Secondary | ICD-10-CM | POA: Diagnosis not present

## 2021-11-07 DIAGNOSIS — Z48813 Encounter for surgical aftercare following surgery on the respiratory system: Secondary | ICD-10-CM | POA: Diagnosis not present

## 2021-11-07 DIAGNOSIS — C3490 Malignant neoplasm of unspecified part of unspecified bronchus or lung: Secondary | ICD-10-CM | POA: Diagnosis not present

## 2021-11-07 DIAGNOSIS — I878 Other specified disorders of veins: Secondary | ICD-10-CM | POA: Diagnosis not present

## 2021-11-07 DIAGNOSIS — I130 Hypertensive heart and chronic kidney disease with heart failure and stage 1 through stage 4 chronic kidney disease, or unspecified chronic kidney disease: Secondary | ICD-10-CM | POA: Diagnosis not present

## 2021-11-07 DIAGNOSIS — I700 Atherosclerosis of aorta: Secondary | ICD-10-CM | POA: Diagnosis not present

## 2021-11-07 DIAGNOSIS — M4802 Spinal stenosis, cervical region: Secondary | ICD-10-CM | POA: Diagnosis not present

## 2021-11-07 DIAGNOSIS — J449 Chronic obstructive pulmonary disease, unspecified: Secondary | ICD-10-CM | POA: Diagnosis not present

## 2021-11-07 DIAGNOSIS — J9621 Acute and chronic respiratory failure with hypoxia: Secondary | ICD-10-CM | POA: Diagnosis not present

## 2021-11-07 DIAGNOSIS — Z4801 Encounter for change or removal of surgical wound dressing: Secondary | ICD-10-CM | POA: Diagnosis not present

## 2021-11-07 DIAGNOSIS — M479 Spondylosis, unspecified: Secondary | ICD-10-CM | POA: Diagnosis not present

## 2021-11-07 DIAGNOSIS — M19072 Primary osteoarthritis, left ankle and foot: Secondary | ICD-10-CM | POA: Diagnosis not present

## 2021-11-07 DIAGNOSIS — M810 Age-related osteoporosis without current pathological fracture: Secondary | ICD-10-CM | POA: Diagnosis not present

## 2021-11-07 DIAGNOSIS — N1832 Chronic kidney disease, stage 3b: Secondary | ICD-10-CM | POA: Diagnosis not present

## 2021-11-07 DIAGNOSIS — D472 Monoclonal gammopathy: Secondary | ICD-10-CM | POA: Diagnosis not present

## 2021-11-07 DIAGNOSIS — G4733 Obstructive sleep apnea (adult) (pediatric): Secondary | ICD-10-CM | POA: Diagnosis not present

## 2021-11-07 DIAGNOSIS — M1030 Gout due to renal impairment, unspecified site: Secondary | ICD-10-CM | POA: Diagnosis not present

## 2021-11-07 DIAGNOSIS — D630 Anemia in neoplastic disease: Secondary | ICD-10-CM | POA: Diagnosis not present

## 2021-11-07 DIAGNOSIS — D631 Anemia in chronic kidney disease: Secondary | ICD-10-CM | POA: Diagnosis not present

## 2021-11-07 DIAGNOSIS — E785 Hyperlipidemia, unspecified: Secondary | ICD-10-CM | POA: Diagnosis not present

## 2021-11-07 DIAGNOSIS — I2781 Cor pulmonale (chronic): Secondary | ICD-10-CM | POA: Diagnosis not present

## 2021-11-07 DIAGNOSIS — H539 Unspecified visual disturbance: Secondary | ICD-10-CM | POA: Diagnosis not present

## 2021-11-07 DIAGNOSIS — M19071 Primary osteoarthritis, right ankle and foot: Secondary | ICD-10-CM | POA: Diagnosis not present

## 2021-11-09 ENCOUNTER — Other Ambulatory Visit: Payer: Self-pay | Admitting: Medical Oncology

## 2021-11-09 ENCOUNTER — Ambulatory Visit (HOSPITAL_COMMUNITY)
Admission: RE | Admit: 2021-11-09 | Discharge: 2021-11-09 | Disposition: A | Discharge status: Home / Self Care | Payer: Medicare Other | Source: Ambulatory Visit | Attending: Internal Medicine | Admitting: Internal Medicine

## 2021-11-09 ENCOUNTER — Ambulatory Visit: Payer: Medicare Other

## 2021-11-09 ENCOUNTER — Other Ambulatory Visit: Payer: Self-pay

## 2021-11-09 ENCOUNTER — Inpatient Hospital Stay: Payer: Medicare Other

## 2021-11-09 ENCOUNTER — Telehealth: Payer: Self-pay

## 2021-11-09 DIAGNOSIS — J90 Pleural effusion, not elsewhere classified: Secondary | ICD-10-CM | POA: Diagnosis not present

## 2021-11-09 DIAGNOSIS — C3490 Malignant neoplasm of unspecified part of unspecified bronchus or lung: Secondary | ICD-10-CM | POA: Insufficient documentation

## 2021-11-09 DIAGNOSIS — D649 Anemia, unspecified: Secondary | ICD-10-CM

## 2021-11-09 LAB — CBC WITH DIFFERENTIAL (CANCER CENTER ONLY)
Abs Immature Granulocytes: 0.02 10*3/uL (ref 0.00–0.07)
Basophils Absolute: 0 10*3/uL (ref 0.0–0.1)
Basophils Relative: 0 %
Eosinophils Absolute: 0.1 10*3/uL (ref 0.0–0.5)
Eosinophils Relative: 1 %
HCT: 19.1 % — ABNORMAL LOW (ref 36.0–46.0)
Hemoglobin: 6 g/dL — CL (ref 12.0–15.0)
Immature Granulocytes: 0 %
Lymphocytes Relative: 9 %
Lymphs Abs: 0.7 10*3/uL (ref 0.7–4.0)
MCH: 27.6 pg (ref 26.0–34.0)
MCHC: 31.4 g/dL (ref 30.0–36.0)
MCV: 88 fL (ref 80.0–100.0)
Monocytes Absolute: 0.7 10*3/uL (ref 0.1–1.0)
Monocytes Relative: 9 %
Neutro Abs: 6.4 10*3/uL (ref 1.7–7.7)
Neutrophils Relative %: 81 %
Platelet Count: 129 10*3/uL — ABNORMAL LOW (ref 150–400)
RBC: 2.17 MIL/uL — ABNORMAL LOW (ref 3.87–5.11)
RDW: 15.7 % — ABNORMAL HIGH (ref 11.5–15.5)
WBC Count: 7.9 10*3/uL (ref 4.0–10.5)
nRBC: 0 % (ref 0.0–0.2)

## 2021-11-09 LAB — CMP (CANCER CENTER ONLY)
ALT: <5 U/L (ref 0–44)
AST: 10 U/L — ABNORMAL LOW (ref 15–41)
Albumin: 3.3 g/dL — ABNORMAL LOW (ref 3.5–5.0)
Alkaline Phosphatase: 41 U/L (ref 38–126)
Anion gap: 6 (ref 5–15)
BUN: 69 mg/dL — ABNORMAL HIGH (ref 8–23)
CO2: 28 mmol/L (ref 22–32)
Calcium: 9.3 mg/dL (ref 8.9–10.3)
Chloride: 104 mmol/L (ref 98–111)
Creatinine: 2.1 mg/dL — ABNORMAL HIGH (ref 0.44–1.00)
GFR, Estimated: 23 mL/min — ABNORMAL LOW (ref >60)
Glucose, Bld: 116 mg/dL — ABNORMAL HIGH (ref 70–99)
Potassium: 3.9 mmol/L (ref 3.5–5.1)
Sodium: 138 mmol/L (ref 135–145)
Total Bilirubin: 0.2 mg/dL — ABNORMAL LOW (ref 0.3–1.2)
Total Protein: 6.3 g/dL — ABNORMAL LOW (ref 6.5–8.1)

## 2021-11-09 LAB — PREPARE RBC (CROSSMATCH)

## 2021-11-09 NOTE — Telephone Encounter (Signed)
CRITICAL VALUE STICKER ? ?CRITICAL VALUE: Hgb = 6.0 ? ?RECEIVER (on-site recipient of call): Yetta Glassman, CMA ? ?DATE & TIME NOTIFIED: 11/09/21 at 10:20am ? ?MESSENGER (representative from lab): Ulice Dash ? ?MD NOTIFIED: Julien Nordmann ? ?TIME OF NOTIFICATION: 11/09/21 at 10:24am ? ?RESPONSE: Notification given to Dr. Julien Nordmann for follow-up with pt. ? ?

## 2021-11-10 ENCOUNTER — Inpatient Hospital Stay: Payer: Medicare Other

## 2021-11-10 ENCOUNTER — Encounter: Payer: Self-pay | Admitting: Dietician

## 2021-11-10 DIAGNOSIS — C772 Secondary and unspecified malignant neoplasm of intra-abdominal lymph nodes: Secondary | ICD-10-CM | POA: Diagnosis not present

## 2021-11-10 DIAGNOSIS — C771 Secondary and unspecified malignant neoplasm of intrathoracic lymph nodes: Secondary | ICD-10-CM | POA: Diagnosis not present

## 2021-11-10 DIAGNOSIS — C3492 Malignant neoplasm of unspecified part of left bronchus or lung: Secondary | ICD-10-CM | POA: Diagnosis not present

## 2021-11-10 DIAGNOSIS — C775 Secondary and unspecified malignant neoplasm of intrapelvic lymph nodes: Secondary | ICD-10-CM | POA: Diagnosis not present

## 2021-11-10 DIAGNOSIS — D649 Anemia, unspecified: Secondary | ICD-10-CM

## 2021-11-10 DIAGNOSIS — C782 Secondary malignant neoplasm of pleura: Secondary | ICD-10-CM | POA: Diagnosis not present

## 2021-11-10 DIAGNOSIS — C7951 Secondary malignant neoplasm of bone: Secondary | ICD-10-CM | POA: Diagnosis not present

## 2021-11-10 DIAGNOSIS — I110 Hypertensive heart disease with heart failure: Secondary | ICD-10-CM | POA: Diagnosis not present

## 2021-11-10 DIAGNOSIS — I5042 Chronic combined systolic (congestive) and diastolic (congestive) heart failure: Secondary | ICD-10-CM | POA: Diagnosis not present

## 2021-11-10 DIAGNOSIS — D500 Iron deficiency anemia secondary to blood loss (chronic): Secondary | ICD-10-CM | POA: Diagnosis not present

## 2021-11-10 MED ORDER — DIPHENHYDRAMINE HCL 25 MG PO CAPS
25.0000 mg | ORAL_CAPSULE | Freq: Once | ORAL | Status: AC
  Administered 2021-11-10: 25 mg via ORAL
  Filled 2021-11-10: qty 1

## 2021-11-10 MED ORDER — SODIUM CHLORIDE 0.9% IV SOLUTION
250.0000 mL | Freq: Once | INTRAVENOUS | Status: AC
  Administered 2021-11-10: 250 mL via INTRAVENOUS

## 2021-11-10 MED ORDER — ACETAMINOPHEN 325 MG PO TABS
650.0000 mg | ORAL_TABLET | Freq: Once | ORAL | Status: AC
  Administered 2021-11-10: 650 mg via ORAL
  Filled 2021-11-10: qty 2

## 2021-11-10 NOTE — Patient Instructions (Signed)
Blood Transfusion, Adult, Care After ?This sheet gives you information about how to care for yourself after your procedure. Your doctor may also give you more specific instructions. If you have problems or questions, contact your doctor. ?What can I expect after the procedure? ?After the procedure, it is common to have: ?Bruising and soreness at the IV site. ?A headache. ?Follow these instructions at home: ?Insertion site care ? ?  ? ?Follow instructions from your doctor about how to take care of your insertion site. This is where an IV tube was put into your vein. Make sure you: ?Wash your hands with soap and water before and after you change your bandage (dressing). If you cannot use soap and water, use hand sanitizer. ?Change your bandage as told by your doctor. ?Check your insertion site every day for signs of infection. Check for: ?Redness, swelling, or pain. ?Bleeding from the site. ?Warmth. ?Pus or a bad smell. ?General instructions ?Take over-the-counter and prescription medicines only as told by your doctor. ?Rest as told by your doctor. ?Go back to your normal activities as told by your doctor. ?Keep all follow-up visits as told by your doctor. This is important. ?Contact a doctor if: ?You have itching or red, swollen areas of skin (hives). ?You feel worried or nervous (anxious). ?You feel weak after doing your normal activities. ?You have redness, swelling, warmth, or pain around the insertion site. ?You have blood coming from the insertion site, and the blood does not stop with pressure. ?You have pus or a bad smell coming from the insertion site. ?Get help right away if: ?You have signs of a serious reaction. This may be coming from an allergy or the body's defense system (immune system). Signs include: ?Trouble breathing or shortness of breath. ?Swelling of the face or feeling warm (flushed). ?Fever or chills. ?Head, chest, or back pain. ?Dark pee (urine) or blood in the pee. ?Widespread rash. ?Fast  heartbeat. ?Feeling dizzy or light-headed. ?You may receive your blood transfusion in an outpatient setting. If so, you will be told whom to contact to report any reactions. ?These symptoms may be an emergency. Do not wait to see if the symptoms will go away. Get medical help right away. Call your local emergency services (911 in the [place] Do not drive yourself to the hospital. ?Summary ?Bruising and soreness at the IV site are common. ?Check your insertion site every day for signs of infection. ?Rest as told by your doctor. Go back to your normal activities as told by your doctor. ?Get help right away if you have signs of a serious reaction. ?This information is not intended to replace advice given to you by your health care provider. Make sure you discuss any questions you have with your health care provider. ?Document Revised: 11/10/2020 Document Reviewed: 01/08/2019 ?Elsevier Patient Education ? [place]. ? ?

## 2021-11-10 NOTE — Progress Notes (Signed)
Provided one complimentary case of Ensure Plus High Protein on 4/13 ?

## 2021-11-12 LAB — BPAM RBC
Blood Product Expiration Date: 202305152359
Blood Product Expiration Date: 202305162359
ISSUE DATE / TIME: 202304141159
ISSUE DATE / TIME: 202304141159
Unit Type and Rh: 5100
Unit Type and Rh: 5100

## 2021-11-12 LAB — TYPE AND SCREEN
ABO/RH(D): O POS
Antibody Screen: NEGATIVE
Donor AG Type: NEGATIVE
Donor AG Type: NEGATIVE
Unit division: 0
Unit division: 0

## 2021-11-13 ENCOUNTER — Other Ambulatory Visit: Payer: Self-pay

## 2021-11-13 ENCOUNTER — Inpatient Hospital Stay: Payer: Medicare Other

## 2021-11-13 ENCOUNTER — Inpatient Hospital Stay: Payer: Medicare Other | Admitting: Internal Medicine

## 2021-11-13 ENCOUNTER — Other Ambulatory Visit: Payer: Self-pay | Admitting: Medical Oncology

## 2021-11-13 VITALS — BP 144/35 | HR 79 | Temp 97.5°F | Resp 19 | Ht 60.0 in | Wt 160.0 lb

## 2021-11-13 DIAGNOSIS — C3492 Malignant neoplasm of unspecified part of left bronchus or lung: Secondary | ICD-10-CM | POA: Diagnosis not present

## 2021-11-13 DIAGNOSIS — C7951 Secondary malignant neoplasm of bone: Secondary | ICD-10-CM | POA: Diagnosis not present

## 2021-11-13 DIAGNOSIS — D649 Anemia, unspecified: Secondary | ICD-10-CM

## 2021-11-13 DIAGNOSIS — C782 Secondary malignant neoplasm of pleura: Secondary | ICD-10-CM | POA: Diagnosis not present

## 2021-11-13 DIAGNOSIS — D508 Other iron deficiency anemias: Secondary | ICD-10-CM | POA: Diagnosis not present

## 2021-11-13 DIAGNOSIS — D500 Iron deficiency anemia secondary to blood loss (chronic): Secondary | ICD-10-CM | POA: Diagnosis not present

## 2021-11-13 DIAGNOSIS — Z5181 Encounter for therapeutic drug level monitoring: Secondary | ICD-10-CM

## 2021-11-13 DIAGNOSIS — C775 Secondary and unspecified malignant neoplasm of intrapelvic lymph nodes: Secondary | ICD-10-CM | POA: Diagnosis not present

## 2021-11-13 DIAGNOSIS — I110 Hypertensive heart disease with heart failure: Secondary | ICD-10-CM | POA: Diagnosis not present

## 2021-11-13 DIAGNOSIS — C771 Secondary and unspecified malignant neoplasm of intrathoracic lymph nodes: Secondary | ICD-10-CM | POA: Diagnosis not present

## 2021-11-13 DIAGNOSIS — I5042 Chronic combined systolic (congestive) and diastolic (congestive) heart failure: Secondary | ICD-10-CM | POA: Diagnosis not present

## 2021-11-13 DIAGNOSIS — C772 Secondary and unspecified malignant neoplasm of intra-abdominal lymph nodes: Secondary | ICD-10-CM | POA: Diagnosis not present

## 2021-11-13 LAB — CMP (CANCER CENTER ONLY)
ALT: 5 U/L (ref 0–44)
AST: 10 U/L — ABNORMAL LOW (ref 15–41)
Albumin: 3.4 g/dL — ABNORMAL LOW (ref 3.5–5.0)
Alkaline Phosphatase: 44 U/L (ref 38–126)
Anion gap: 4 — ABNORMAL LOW (ref 5–15)
BUN: 58 mg/dL — ABNORMAL HIGH (ref 8–23)
CO2: 29 mmol/L (ref 22–32)
Calcium: 9.4 mg/dL (ref 8.9–10.3)
Chloride: 105 mmol/L (ref 98–111)
Creatinine: 1.74 mg/dL — ABNORMAL HIGH (ref 0.44–1.00)
GFR, Estimated: 29 mL/min — ABNORMAL LOW (ref >60)
Glucose, Bld: 117 mg/dL — ABNORMAL HIGH (ref 70–99)
Potassium: 4.2 mmol/L (ref 3.5–5.1)
Sodium: 138 mmol/L (ref 135–145)
Total Bilirubin: 0.3 mg/dL (ref 0.3–1.2)
Total Protein: 6.5 g/dL (ref 6.5–8.1)

## 2021-11-13 LAB — CBC WITH DIFFERENTIAL (CANCER CENTER ONLY)
Abs Immature Granulocytes: 0.03 10*3/uL (ref 0.00–0.07)
Basophils Absolute: 0 10*3/uL (ref 0.0–0.1)
Basophils Relative: 0 %
Eosinophils Absolute: 0.1 10*3/uL (ref 0.0–0.5)
Eosinophils Relative: 1 %
HCT: 26.2 % — ABNORMAL LOW (ref 36.0–46.0)
Hemoglobin: 8.1 g/dL — ABNORMAL LOW (ref 12.0–15.0)
Immature Granulocytes: 0 %
Lymphocytes Relative: 9 %
Lymphs Abs: 0.7 10*3/uL (ref 0.7–4.0)
MCH: 27.6 pg (ref 26.0–34.0)
MCHC: 30.9 g/dL (ref 30.0–36.0)
MCV: 89.4 fL (ref 80.0–100.0)
Monocytes Absolute: 0.7 10*3/uL (ref 0.1–1.0)
Monocytes Relative: 9 %
Neutro Abs: 6.2 10*3/uL (ref 1.7–7.7)
Neutrophils Relative %: 81 %
Platelet Count: 117 10*3/uL — ABNORMAL LOW (ref 150–400)
RBC: 2.93 MIL/uL — ABNORMAL LOW (ref 3.87–5.11)
RDW: 15.3 % (ref 11.5–15.5)
WBC Count: 7.6 10*3/uL (ref 4.0–10.5)
nRBC: 0 % (ref 0.0–0.2)

## 2021-11-13 LAB — SAMPLE TO BLOOD BANK

## 2021-11-13 NOTE — Progress Notes (Signed)
?    [facility] ?[phone]   [phone] ? ?OFFICE PROGRESS NOTE ? ?Laurie Lima, MD ?[place] [address] ? ?DIAGNOSIS: Stage IV (Tx, N3, M1C) non-small cell lung cancer, adenocarcinoma. She presented with a left primary lung lesion and widespread metastatic disease in the thoracic nodes, left pleural space, abdomen pelvic lymph nodes, and bones.  She was diagnosed in October 2022. ? ?Molecular studies: The patient has positive EGFR mutation in exon 21 ([id number]) ?  ?PRIOR THERAPY:   ?  ?CURRENT THERAPY: Targeted treatment with Tagrisso 80 mg p.o. daily.  First dose started on June 21, 2021.  Status post 6 months. ? ?INTERVAL HISTORY: ?[name] 81 y.o. female returns to the clinic today for follow-up visit accompanied by her daughter.  The patient is feeling fine today with no concerning complaints except for the baseline fatigue.  She has 1 episode of rectal bleeding yesterday.  She received 2 units of PRBCs transfusion few days ago.  She denied having any current chest pain but has shortness of breath with exertion with no cough or hemoptysis.  She has no nausea, vomiting, diarrhea or constipation.  She has no headache or visual changes.  The patient denied having any significant weight loss or night sweats.  She continues to tolerate her treatment with Tagrisso fairly well.  She is here today for evaluation and repeat CT scan for restaging of her disease. ? ? ?MEDICAL HISTORY: ?Past Medical History:  ?Diagnosis Date  ? Allergy   ? Angiodysplasia of cecum   ? Angiodysplasia of duodenum   ? Arthritis   ? Asteatotic eczema 02/25/2016  ? Asthma   ? Atrial fibrillation ([place]) 10/17/2017  ? Blood transfusion without reported diagnosis   ? Cancer [facility])   ? Cataract   ? CHF (congestive heart failure), NYHA class III, chronic, combined ([place]) 08/10/2017  ? Colonic polyp 02/16/2008  ? Tubular adenoma  ? COPD (chronic obstructive pulmonary disease) ([place])  01/27/1998  ? Cor pulmonale ([place]) 11/16/2014  ? GERD 07/30/1992  ? Gout   ? HTN (hypertension) 07/30/1988  ? Hyperlipidemia with target LDL less than 100 11/27/1996  ?     ? Kidney stone 1960, 1972, 1991  ? Medical history non-contributory   ? MGUS (monoclonal gammopathy of unknown significance) 11/08/2016  ? Morbid obesity ([place]) 04/19/2010  ? She agrees to work on her lifestyle modifications to help her lose weight.   ? OSA (obstructive sleep apnea) 06/22/2013  ? Osteopenia, senile 02/05/2013  ? July 2014  -2.1 left femur -1.9 left forearm   ? Prediabetes 11/13/2006  ? Renal insufficiency   ? Stenosis of cervical spine with myelopathy ([place]) 01/01/2018  ? Supplemental oxygen dependent   ? 2L via [place]  ? Ulcer of leg, chronic, right ([place]) 10/10/2011  ? ULCERATIVE COLITIS-LEFT SIDE 03/19/2008  ?     ? Vitamin B12 deficiency anemia 11/13/2006  ?     ? Vitamin D deficiency 02/06/2013  ? ? ?ALLERGIES:  is allergic to celebrex [celecoxib], amlodipine besylate, enalapril, lipitor [atorvastatin], trelegy ellipta [fluticasone-umeclidin-vilant], amoxicillin, codeine sulfate, hydrocodone-acetaminophen, and tape. ? ?MEDICATIONS:  ?Current Outpatient Medications  ?Medication Sig Dispense Refill  ? acetaminophen (TYLENOL) 325 MG tablet Take 325-650 mg by mouth daily as needed for moderate pain, fever or headache.     ? albuterol (PROAIR HFA) 108 (90 Base) MCG/ACT inhaler Inhale 1-2 puffs into the lungs every 6 (six) hours as needed for wheezing or shortness  of breath. 18 g 11  ? allopurinol (ZYLOPRIM) 100 MG tablet TAKE 1 TABLET(100 MG) BY MOUTH DAILY (Patient taking differently: Take 100 mg by mouth daily.) 90 tablet 1  ? clonazePAM (KLONOPIN) 1 MG tablet Take 1 tablet (1 mg total) by mouth 2 (two) times daily as needed for anxiety. (Patient taking differently: Take 0.5-1 mg by mouth 2 (two) times daily as needed for anxiety.) 180 tablet 0  ? ferrous sulfate 325 (65 FE) MG tablet Take 1 tablet (325 mg total) by mouth 2 (two) times  daily with a meal. 180 tablet 1  ? fluticasone furoate-vilanterol (BREO ELLIPTA) 100-25 MCG/ACT AEPB Inhale 1 puff into the lungs daily. 120 each 1  ? isosorbide-hydrALAZINE (BIDIL) 20-37.5 MG tablet TAKE 1 TABLET BY MOUTH THREE TIMES DAILY (Patient taking differently: Take 1 tablet by mouth in the morning and at bedtime.) 270 tablet 0  ? metoprolol tartrate (LOPRESSOR) 25 MG tablet TAKE 1 TABLET(25 MG) BY MOUTH TWICE DAILY (Patient taking differently: Take 25 mg by mouth in the morning and at bedtime.) 180 tablet 1  ? morphine (MS CONTIN) 15 MG 12 hr tablet Take 1 tablet (15 mg total) by mouth 2 (two) times daily as needed for pain. 60 tablet 0  ? Nutritional Supplements (CARNATION BREAKFAST ESSENTIALS) LIQD Take 237 mLs by mouth daily.    ? nystatin (MYCOSTATIN/NYSTOP) powder Apply 1 application. topically 3 (three) times daily. 60 g 0  ? osimertinib mesylate (TAGRISSO) 80 MG tablet Take 1 tablet (80 mg total) by mouth daily. 30 tablet 3  ? OXYGEN Inhale 2 L/min into the lungs continuous.    ? prochlorperazine (COMPAZINE) 10 MG tablet Take 1 tablet (10 mg total) by mouth every 6 (six) hours as needed. (Patient taking differently: Take 10 mg by mouth every 6 (six) hours as needed for nausea or vomiting.) 30 tablet 2  ? protein supplement shake (PREMIER PROTEIN) LIQD Take 11 oz by mouth 2 (two) times daily between meals.    ? torsemide (DEMADEX) 20 MG tablet TAKE 2 TABLETS(40 MG) BY MOUTH EVERY MORNING (Patient taking differently: Take 40 mg by mouth in the morning.) 180 tablet 0  ? ?Current Facility-Administered Medications  ?Medication Dose Route Frequency Provider Last Rate Last Admin  ? cyanocobalamin ((VITAMIN B-12)) injection 1,000 mcg  1,000 mcg Intramuscular Q30 days Laurie Lima, MD   1,000 mcg at 01/31/21 1507  ? ? ?SURGICAL HISTORY:  ?Past Surgical History:  ?Procedure Laterality Date  ? BRONCHOSCOPY    ? CHEST TUBE INSERTION Left 06/15/2021  ? Procedure: INSERTION PLEURAL DRAINAGE CATHETER;  Surgeon:  [name], MD;  Location: [facility];  Service: Thoracic;  Laterality: Left;  ? COLONOSCOPY    ? COLONOSCOPY WITH PROPOFOL N/A 02/23/2015  ? Procedure: COLONOSCOPY WITH PROPOFOL;  Surgeon: Ladene Artist, MD;  Location: [facility];  Service: Endoscopy;  Laterality: N/A;  ? COLONOSCOPY WITH PROPOFOL N/A 08/12/2018  ? Procedure: COLONOSCOPY WITH PROPOFOL;  Surgeon: Ladene Artist, MD;  Location: [facility];  Service: Endoscopy;  Laterality: N/A;  ? COLONOSCOPY WITH PROPOFOL N/A 05/09/2020  ? Procedure: COLONOSCOPY WITH PROPOFOL;  Surgeon: Ladene Artist, MD;  Location: [facility];  Service: Endoscopy;  Laterality: N/A;  To Splenic Flexture  ? ESOPHAGOGASTRODUODENOSCOPY (EGD) WITH PROPOFOL N/A 02/23/2015  ? Procedure: ESOPHAGOGASTRODUODENOSCOPY (EGD) WITH PROPOFOL;  Surgeon: Ladene Artist, MD;  Location: [facility];  Service: Endoscopy;  Laterality: N/A;  ? ESOPHAGOGASTRODUODENOSCOPY (EGD) WITH PROPOFOL N/A 08/12/2018  ? Procedure: ESOPHAGOGASTRODUODENOSCOPY (EGD) WITH  PROPOFOL;  Surgeon: Ladene Artist, MD;  Location: [facility];  Service: Endoscopy;  Laterality: N/A;  ? ESOPHAGOGASTRODUODENOSCOPY (EGD) WITH PROPOFOL N/A 05/08/2020  ? Procedure: ESOPHAGOGASTRODUODENOSCOPY (EGD) WITH PROPOFOL;  Surgeon: Yetta Flock, MD;  Location: [facility];  Service: Gastroenterology;  Laterality: N/A;  ? HOT HEMOSTASIS N/A 08/12/2018  ? Procedure: HOT HEMOSTASIS (ARGON PLASMA COAGULATION/BICAP);  Surgeon: Ladene Artist, MD;  Location: [facility];  Service: Endoscopy;  Laterality: N/A;  EGD and Colon APC  ? HOT HEMOSTASIS N/A 05/08/2020  ? Procedure: HOT HEMOSTASIS (ARGON PLASMA COAGULATION/BICAP);  Surgeon: Yetta Flock, MD;  Location: [facility];  Service: Gastroenterology;  Laterality: N/A;  ? LAPAROTOMY N/A 08/11/2020  ? Procedure: EXPLORATORY LAPAROTOMY PARTIAL COLECTOMY AND COLOSTOMY WITH RESECTION OF A MESSENTERIC MASS;  Surgeon: Coralie Keens, MD;  Location: [facility];  Service:  General;  Laterality: N/A;  ? POLYPECTOMY    ? REMOVAL OF PLEURAL DRAINAGE CATHETER N/A 10/17/2021  ? Procedure: MINOR REMOVAL OF PLEURAL DRAINAGE CATHETER;  Surgeon: [name], MD;  Shelly Coss

## 2021-11-16 DIAGNOSIS — Z933 Colostomy status: Secondary | ICD-10-CM | POA: Diagnosis not present

## 2021-11-16 DIAGNOSIS — C3490 Malignant neoplasm of unspecified part of unspecified bronchus or lung: Secondary | ICD-10-CM | POA: Diagnosis not present

## 2021-11-16 DIAGNOSIS — J910 Malignant pleural effusion: Secondary | ICD-10-CM | POA: Diagnosis not present

## 2021-11-22 ENCOUNTER — Telehealth: Payer: Medicare Other

## 2021-11-22 ENCOUNTER — Inpatient Hospital Stay (HOSPITAL_COMMUNITY): Payer: Medicare Other

## 2021-11-22 ENCOUNTER — Other Ambulatory Visit: Payer: Self-pay

## 2021-11-22 ENCOUNTER — Emergency Department (HOSPITAL_COMMUNITY): Payer: Medicare Other

## 2021-11-22 ENCOUNTER — Encounter (HOSPITAL_COMMUNITY): Payer: Self-pay | Admitting: Pharmacy Technician

## 2021-11-22 ENCOUNTER — Inpatient Hospital Stay (HOSPITAL_COMMUNITY)
Admission: EM | Admit: 2021-11-22 | Discharge: 2021-11-26 | DRG: 378 | Disposition: A | Discharge status: Home Health | Payer: Medicare Other | Attending: Student | Admitting: Student

## 2021-11-22 DIAGNOSIS — Z20822 Contact with and (suspected) exposure to covid-19: Secondary | ICD-10-CM | POA: Diagnosis not present

## 2021-11-22 DIAGNOSIS — J9611 Chronic respiratory failure with hypoxia: Secondary | ICD-10-CM | POA: Diagnosis present

## 2021-11-22 DIAGNOSIS — M109 Gout, unspecified: Secondary | ICD-10-CM | POA: Diagnosis present

## 2021-11-22 DIAGNOSIS — I10 Essential (primary) hypertension: Secondary | ICD-10-CM | POA: Diagnosis present

## 2021-11-22 DIAGNOSIS — E559 Vitamin D deficiency, unspecified: Secondary | ICD-10-CM | POA: Diagnosis present

## 2021-11-22 DIAGNOSIS — I110 Hypertensive heart disease with heart failure: Secondary | ICD-10-CM | POA: Diagnosis not present

## 2021-11-22 DIAGNOSIS — I480 Paroxysmal atrial fibrillation: Secondary | ICD-10-CM | POA: Diagnosis not present

## 2021-11-22 DIAGNOSIS — J90 Pleural effusion, not elsewhere classified: Secondary | ICD-10-CM | POA: Diagnosis not present

## 2021-11-22 DIAGNOSIS — K439 Ventral hernia without obstruction or gangrene: Secondary | ICD-10-CM | POA: Diagnosis not present

## 2021-11-22 DIAGNOSIS — Z801 Family history of malignant neoplasm of trachea, bronchus and lung: Secondary | ICD-10-CM

## 2021-11-22 DIAGNOSIS — Z6832 Body mass index (BMI) 32.0-32.9, adult: Secondary | ICD-10-CM

## 2021-11-22 DIAGNOSIS — D519 Vitamin B12 deficiency anemia, unspecified: Secondary | ICD-10-CM | POA: Diagnosis not present

## 2021-11-22 DIAGNOSIS — R0603 Acute respiratory distress: Secondary | ICD-10-CM | POA: Diagnosis not present

## 2021-11-22 DIAGNOSIS — J910 Malignant pleural effusion: Secondary | ICD-10-CM | POA: Diagnosis not present

## 2021-11-22 DIAGNOSIS — J449 Chronic obstructive pulmonary disease, unspecified: Secondary | ICD-10-CM | POA: Diagnosis present

## 2021-11-22 DIAGNOSIS — K436 Other and unspecified ventral hernia with obstruction, without gangrene: Secondary | ICD-10-CM | POA: Diagnosis not present

## 2021-11-22 DIAGNOSIS — Z79899 Other long term (current) drug therapy: Secondary | ICD-10-CM

## 2021-11-22 DIAGNOSIS — K219 Gastro-esophageal reflux disease without esophagitis: Secondary | ICD-10-CM | POA: Diagnosis not present

## 2021-11-22 DIAGNOSIS — C3492 Malignant neoplasm of unspecified part of left bronchus or lung: Secondary | ICD-10-CM | POA: Diagnosis not present

## 2021-11-22 DIAGNOSIS — N1832 Chronic kidney disease, stage 3b: Secondary | ICD-10-CM | POA: Diagnosis not present

## 2021-11-22 DIAGNOSIS — E669 Obesity, unspecified: Secondary | ICD-10-CM | POA: Diagnosis present

## 2021-11-22 DIAGNOSIS — I5042 Chronic combined systolic (congestive) and diastolic (congestive) heart failure: Secondary | ICD-10-CM | POA: Diagnosis not present

## 2021-11-22 DIAGNOSIS — Z832 Family history of diseases of the blood and blood-forming organs and certain disorders involving the immune mechanism: Secondary | ICD-10-CM

## 2021-11-22 DIAGNOSIS — C7989 Secondary malignant neoplasm of other specified sites: Secondary | ICD-10-CM | POA: Diagnosis present

## 2021-11-22 DIAGNOSIS — I130 Hypertensive heart and chronic kidney disease with heart failure and stage 1 through stage 4 chronic kidney disease, or unspecified chronic kidney disease: Secondary | ICD-10-CM | POA: Diagnosis not present

## 2021-11-22 DIAGNOSIS — Z823 Family history of stroke: Secondary | ICD-10-CM

## 2021-11-22 DIAGNOSIS — Z880 Allergy status to penicillin: Secondary | ICD-10-CM

## 2021-11-22 DIAGNOSIS — D649 Anemia, unspecified: Secondary | ICD-10-CM | POA: Diagnosis present

## 2021-11-22 DIAGNOSIS — Z9981 Dependence on supplemental oxygen: Secondary | ICD-10-CM

## 2021-11-22 DIAGNOSIS — G4733 Obstructive sleep apnea (adult) (pediatric): Secondary | ICD-10-CM | POA: Diagnosis present

## 2021-11-22 DIAGNOSIS — Z8051 Family history of malignant neoplasm of kidney: Secondary | ICD-10-CM

## 2021-11-22 DIAGNOSIS — Z888 Allergy status to other drugs, medicaments and biological substances status: Secondary | ICD-10-CM

## 2021-11-22 DIAGNOSIS — I872 Venous insufficiency (chronic) (peripheral): Secondary | ICD-10-CM | POA: Diagnosis present

## 2021-11-22 DIAGNOSIS — N189 Chronic kidney disease, unspecified: Secondary | ICD-10-CM | POA: Diagnosis not present

## 2021-11-22 DIAGNOSIS — J9621 Acute and chronic respiratory failure with hypoxia: Secondary | ICD-10-CM | POA: Diagnosis present

## 2021-11-22 DIAGNOSIS — I5032 Chronic diastolic (congestive) heart failure: Secondary | ICD-10-CM | POA: Diagnosis present

## 2021-11-22 DIAGNOSIS — I5033 Acute on chronic diastolic (congestive) heart failure: Secondary | ICD-10-CM | POA: Diagnosis present

## 2021-11-22 DIAGNOSIS — Z833 Family history of diabetes mellitus: Secondary | ICD-10-CM

## 2021-11-22 DIAGNOSIS — N183 Chronic kidney disease, stage 3 unspecified: Secondary | ICD-10-CM | POA: Diagnosis not present

## 2021-11-22 DIAGNOSIS — K922 Gastrointestinal hemorrhage, unspecified: Secondary | ICD-10-CM | POA: Diagnosis not present

## 2021-11-22 DIAGNOSIS — E785 Hyperlipidemia, unspecified: Secondary | ICD-10-CM | POA: Diagnosis present

## 2021-11-22 DIAGNOSIS — Z7189 Other specified counseling: Secondary | ICD-10-CM | POA: Diagnosis not present

## 2021-11-22 DIAGNOSIS — D472 Monoclonal gammopathy: Secondary | ICD-10-CM | POA: Diagnosis present

## 2021-11-22 DIAGNOSIS — J9811 Atelectasis: Secondary | ICD-10-CM | POA: Diagnosis present

## 2021-11-22 DIAGNOSIS — D62 Acute posthemorrhagic anemia: Secondary | ICD-10-CM | POA: Diagnosis not present

## 2021-11-22 DIAGNOSIS — Z87891 Personal history of nicotine dependence: Secondary | ICD-10-CM

## 2021-11-22 DIAGNOSIS — Z933 Colostomy status: Secondary | ICD-10-CM | POA: Diagnosis not present

## 2021-11-22 DIAGNOSIS — F419 Anxiety disorder, unspecified: Secondary | ICD-10-CM

## 2021-11-22 DIAGNOSIS — C7951 Secondary malignant neoplasm of bone: Secondary | ICD-10-CM | POA: Diagnosis not present

## 2021-11-22 DIAGNOSIS — I509 Heart failure, unspecified: Secondary | ICD-10-CM | POA: Diagnosis not present

## 2021-11-22 DIAGNOSIS — N179 Acute kidney failure, unspecified: Secondary | ICD-10-CM | POA: Diagnosis present

## 2021-11-22 DIAGNOSIS — K6381 Dieulafoy lesion of intestine: Secondary | ICD-10-CM

## 2021-11-22 DIAGNOSIS — I959 Hypotension, unspecified: Secondary | ICD-10-CM

## 2021-11-22 DIAGNOSIS — Z743 Need for continuous supervision: Secondary | ICD-10-CM | POA: Diagnosis not present

## 2021-11-22 DIAGNOSIS — N289 Disorder of kidney and ureter, unspecified: Secondary | ICD-10-CM | POA: Diagnosis not present

## 2021-11-22 DIAGNOSIS — Z515 Encounter for palliative care: Secondary | ICD-10-CM

## 2021-11-22 DIAGNOSIS — D631 Anemia in chronic kidney disease: Secondary | ICD-10-CM | POA: Diagnosis not present

## 2021-11-22 DIAGNOSIS — K5521 Angiodysplasia of colon with hemorrhage: Secondary | ICD-10-CM | POA: Diagnosis not present

## 2021-11-22 DIAGNOSIS — J432 Centrilobular emphysema: Secondary | ICD-10-CM | POA: Diagnosis not present

## 2021-11-22 DIAGNOSIS — R6889 Other general symptoms and signs: Secondary | ICD-10-CM | POA: Diagnosis not present

## 2021-11-22 DIAGNOSIS — R58 Hemorrhage, not elsewhere classified: Secondary | ICD-10-CM | POA: Diagnosis not present

## 2021-11-22 DIAGNOSIS — K921 Melena: Secondary | ICD-10-CM

## 2021-11-22 DIAGNOSIS — K625 Hemorrhage of anus and rectum: Secondary | ICD-10-CM | POA: Diagnosis not present

## 2021-11-22 DIAGNOSIS — Z800 Family history of malignant neoplasm of digestive organs: Secondary | ICD-10-CM

## 2021-11-22 DIAGNOSIS — Z885 Allergy status to narcotic agent status: Secondary | ICD-10-CM

## 2021-11-22 DIAGNOSIS — K449 Diaphragmatic hernia without obstruction or gangrene: Secondary | ICD-10-CM | POA: Diagnosis not present

## 2021-11-22 DIAGNOSIS — E861 Hypovolemia: Secondary | ICD-10-CM | POA: Diagnosis present

## 2021-11-22 DIAGNOSIS — G8929 Other chronic pain: Secondary | ICD-10-CM | POA: Diagnosis present

## 2021-11-22 DIAGNOSIS — Z8249 Family history of ischemic heart disease and other diseases of the circulatory system: Secondary | ICD-10-CM

## 2021-11-22 DIAGNOSIS — Z8049 Family history of malignant neoplasm of other genital organs: Secondary | ICD-10-CM

## 2021-11-22 LAB — COMPREHENSIVE METABOLIC PANEL
ALT: 7 U/L (ref 0–44)
AST: 17 U/L (ref 15–41)
Albumin: 2.9 g/dL — ABNORMAL LOW (ref 3.5–5.0)
Alkaline Phosphatase: 40 U/L (ref 38–126)
Anion gap: 5 (ref 5–15)
BUN: 63 mg/dL — ABNORMAL HIGH (ref 8–23)
CO2: 26 mmol/L (ref 22–32)
Calcium: 9 mg/dL (ref 8.9–10.3)
Chloride: 105 mmol/L (ref 98–111)
Creatinine, Ser: 2.18 mg/dL — ABNORMAL HIGH (ref 0.44–1.00)
GFR, Estimated: 22 mL/min — ABNORMAL LOW (ref >60)
Glucose, Bld: 134 mg/dL — ABNORMAL HIGH (ref 70–99)
Potassium: 4.6 mmol/L (ref 3.5–5.1)
Sodium: 136 mmol/L (ref 135–145)
Total Bilirubin: 0.3 mg/dL (ref 0.3–1.2)
Total Protein: 6 g/dL — ABNORMAL LOW (ref 6.5–8.1)

## 2021-11-22 LAB — CBC
HCT: 16.3 % — ABNORMAL LOW (ref 36.0–46.0)
Hemoglobin: 5.1 g/dL — CL (ref 12.0–15.0)
MCH: 29.1 pg (ref 26.0–34.0)
MCHC: 31.3 g/dL (ref 30.0–36.0)
MCV: 93.1 fL (ref 80.0–100.0)
Platelets: 137 10*3/uL — ABNORMAL LOW (ref 150–400)
RBC: 1.75 MIL/uL — ABNORMAL LOW (ref 3.87–5.11)
RDW: 14.3 % (ref 11.5–15.5)
WBC: 7.1 10*3/uL (ref 4.0–10.5)
nRBC: 0 % (ref 0.0–0.2)

## 2021-11-22 LAB — PREPARE RBC (CROSSMATCH)

## 2021-11-22 LAB — RESP PANEL BY RT-PCR (FLU A&B, COVID) ARPGX2
Influenza A by PCR: NEGATIVE
Influenza B by PCR: NEGATIVE
SARS Coronavirus 2 by RT PCR: NEGATIVE

## 2021-11-22 LAB — MRSA NEXT GEN BY PCR, NASAL: MRSA by PCR Next Gen: NOT DETECTED

## 2021-11-22 MED ORDER — PEG-KCL-NACL-NASULF-NA ASC-C 100 G PO SOLR
0.5000 | Freq: Once | ORAL | Status: AC
  Administered 2021-11-23: 100 g via ORAL
  Filled 2021-11-22: qty 1

## 2021-11-22 MED ORDER — ALUM & MAG HYDROXIDE-SIMETH 200-200-20 MG/5ML PO SUSP
15.0000 mL | Freq: Four times a day (QID) | ORAL | Status: DC | PRN
  Administered 2021-11-22: 15 mL via ORAL
  Filled 2021-11-22: qty 30

## 2021-11-22 MED ORDER — PANTOPRAZOLE SODIUM 40 MG IV SOLR
40.0000 mg | Freq: Two times a day (BID) | INTRAVENOUS | Status: DC
Start: 2021-11-26 — End: 2021-11-26
  Administered 2021-11-26 (×2): 40 mg via INTRAVENOUS
  Filled 2021-11-22 (×2): qty 10

## 2021-11-22 MED ORDER — PROCHLORPERAZINE EDISYLATE 10 MG/2ML IJ SOLN: 10.0000 mg | Freq: Four times a day (QID) | INTRAMUSCULAR | Status: DC | PRN

## 2021-11-22 MED ORDER — SODIUM CHLORIDE 0.9 % IV SOLN: 10.0000 mL/h | Freq: Once | INTRAVENOUS | Status: DC

## 2021-11-22 MED ORDER — SODIUM CHLORIDE 0.9 % IV BOLUS: 1000.0000 mL | Freq: Once | INTRAVENOUS | Status: DC

## 2021-11-22 MED ORDER — CHLORHEXIDINE GLUCONATE 0.12 % MT SOLN
15.0000 mL | Freq: Two times a day (BID) | OROMUCOSAL | Status: DC
  Administered 2021-11-23 – 2021-11-26 (×8): 15 mL via OROMUCOSAL
  Filled 2021-11-22 (×8): qty 15

## 2021-11-22 MED ORDER — PANTOPRAZOLE 80MG IVPB - SIMPLE MED
80.0000 mg | Freq: Once | INTRAVENOUS | Status: AC
  Administered 2021-11-22: 80 mg via INTRAVENOUS
  Filled 2021-11-22: qty 80
  Filled 2021-11-22: qty 100

## 2021-11-22 MED ORDER — CHLORHEXIDINE GLUCONATE CLOTH 2 % EX PADS
6.0000 | MEDICATED_PAD | Freq: Every day | CUTANEOUS | Status: DC
  Administered 2021-11-22 – 2021-11-24 (×3): 6 via TOPICAL

## 2021-11-22 MED ORDER — SODIUM CHLORIDE 0.9 % IV BOLUS
500.0000 mL | Freq: Once | INTRAVENOUS | Status: AC
  Administered 2021-11-22: 500 mL via INTRAVENOUS

## 2021-11-22 MED ORDER — PEG-KCL-NACL-NASULF-NA ASC-C 100 G PO SOLR
0.5000 | Freq: Once | ORAL | Status: AC
  Administered 2021-11-22: 100 g via ORAL
  Filled 2021-11-22: qty 1

## 2021-11-22 MED ORDER — ORAL CARE MOUTH RINSE
15.0000 mL | Freq: Two times a day (BID) | OROMUCOSAL | Status: DC
  Administered 2021-11-23 (×2): 15 mL via OROMUCOSAL

## 2021-11-22 MED ORDER — ORAL CARE MOUTH RINSE
15.0000 mL | Freq: Two times a day (BID) | OROMUCOSAL | Status: DC
  Administered 2021-11-22: 15 mL via OROMUCOSAL

## 2021-11-22 MED ORDER — PANTOPRAZOLE INFUSION (NEW) - SIMPLE MED
8.0000 mg/h | INTRAVENOUS | Status: AC
Start: 2021-11-22 — End: 2021-11-25
  Administered 2021-11-22 – 2021-11-25 (×8): 8 mg/h via INTRAVENOUS
  Filled 2021-11-22: qty 100
  Filled 2021-11-22: qty 80
  Filled 2021-11-22: qty 100
  Filled 2021-11-22 (×2): qty 80
  Filled 2021-11-22 (×2): qty 100
  Filled 2021-11-22 (×2): qty 80
  Filled 2021-11-22: qty 100

## 2021-11-22 MED ORDER — PEG-KCL-NACL-NASULF-NA ASC-C 100 G PO SOLR: 1.0000 | Freq: Once | ORAL | Status: DC

## 2021-11-22 MED ORDER — SODIUM CHLORIDE 0.9 % IV BOLUS
1000.0000 mL | Freq: Once | INTRAVENOUS | Status: AC
  Administered 2021-11-22: 1000 mL via INTRAVENOUS

## 2021-11-22 MED ORDER — GUAIFENESIN 100 MG/5ML PO LIQD
5.0000 mL | ORAL | Status: DC | PRN
  Administered 2021-11-22 – 2021-11-26 (×9): 5 mL via ORAL
  Filled 2021-11-22 (×9): qty 10

## 2021-11-22 NOTE — Consult Note (Addendum)
? ?                                            Consultation Note ? ? ?Referring Provider: [facility] ?PCP: Laurie Lima, MD ?Primary Gastroenterologist:  Laurie Edward, MD ?Reason for consultation: GI bleed  ?Hospital Day: 1 ? ? Attending physician's note  ?I have taken a history, reviewed the chart and examined the patient. I performed a substantive portion of this encounter, including complete performance of at least one of the key components, in conjunction with the APP. I agree with the APP's note, impression and recommendations.  ?  ?81 year old female with metastatic lung cancer stage IV, chronic hypoxic respiratory failure, MGUS, CKD stage III, s/p partial colectomy with end colostomy for incarcerated ventral hernia admitted with bleeding through ostomy and worsening anemia.  She has received 6 PRBC within the past 4 weeks, is transfusion dependent ? ?Discussed possible EGD and scope through ostomy to further evaluate etiology of GI blood loss  ? ?Patient is reluctant to proceed.  She does not feel she can drink the bowel prep, feels weak and is also worried about potential risk associated with anesthesia and the procedure.  She wants to hold off endoscopic evaluation at this point ? ?We will leave the bowel prep orders in place in case patient changes her mind overnight ?We will rediscuss with patient tomorrow a.m. ?Continue supportive care ?Transfuse PRBC as needed to maintain hemoglobin >7 ?Palliative care consult to discuss goals of care, consider hospice care ? ? The patient was provided an opportunity to ask questions and all were answered. The patient agreed with the [name] and demonstrated an understanding of the instructions. ? ?[name] , MD ?[phone]    ?ASSESSMENT:  ? ?Chronic GI bleeding (via ostomy bag) / History of both small and large bowel AVMs ?s/p multiple upper and lower endoscopies with APC.  ? ?Acute on chronic anemia.  ?Hx of MGUS but also has chronic intermittent  GI bleeding. Has gotten 6 u PRBC since mid March 2024. Recent hgb~ 8, now down to 5 in setting of actively bleeding.  ? ?History of partial colectomy in 2022  ?Abdominal hernia with incarcerated transverse colon causing severe stenosis   ? ?Stage 4 lung cancer with widespread metastatic disease in thoracic nodes.  ?On [place] ? ?See PMH for additional medical problems ? ? ?[name]:  ?Will [name] for EGD and colostomy thru stoma tomorrow. The risks and benefits of EGD and colonoscopy were discussed with the patient who agrees to proceed.  ?Blood transfusion in progress.  ? ? ?History of Present Illness:  ?[name] is a 81 y.o. female known to [name] with a past medical history significant for stage 4 adenocarcinoma of the left lung with widespread metastatic disease in thoracic nodes, ulcerative colitis, COPD, chronic hypoxic respiratory failure, obesity, CKD3b, MGUS, gout, HTN, PAF, chronic diastolic HF, chronic intermittent GI bleeding with history small bowel and colon, colon polyps, chronic anemia, partial colectomy and end colostomy in 2022 for chronically incarcerated ventral hernia causing compression of transverse colon.  See PMH for any additional medical problems. ? ?[name] was last seen by [place] during a mid March 2023 hospitalization. At that time we evaluated her for chronic loss through ostomy.  She has a history of small bowel and large bowel AVMs. We did not pursue endoscopic evaluation /  treatment at that time. AVMs were last treated at time of EGD and colonoscopy in Oct 2021.    ? ?[name] is followed by Hematology for MGUS. She has received 6 u PRBCs since 10/09/21. Most recently she got 2 units of 4/14 for a hgb of 6. Post transfusion hgb was 8.1 on 4/17. She presents now with a hgb of 5. She has chronic intermittent bleeding into ostomy bag. She says the blood is sometimes dark red, other times bright red. A few minutes prior to onset of bleeding she gets mid abdominal discomfort. Once the  bleeding starts the abdominal discomfort starts to resolve. She has no appetite. Weights in Epic reviewed and stable.  ? ?Of note, in 2009 she she left sided colitis. Biopsies c/w minimally active chronic colitis. She didn't tolerate a trial of mesalamine. We have since looked in colon 3 times without mention of left sided colitis.  ? ?Previous GI Evaluation / History   ? ? ?July 2016 EGD and colonoscopy for GI bleed ?1.Four angiodysplastic lesions at the cecum ?2. Sessile polyp in the ascending colon; polypectomy performed with a cold snare ?3. Mild diverticulosis in the sigmoid colon ?4. Grade l internal hemorrhoids ?Polyp path = tubular adenoma ? ?EGD ?ENDOSCOPIC IMPRESSION: ?1. 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 Small hiatal hernia ? ? ?Jan 2020 EGD and colonoscopy for GI bleed / anemia ?-The examined esophagus was normal. ?-A single 2 mm no bleeding angiodysplastic lesion was found in the gastric antrum. Coagulation for bleeding prevention using argon plasma was successful. The exam of the stomach was otherwise normal. ?A single 3 mm angiodysplastic lesion without bleeding was found in the duodenal bulb. Coagulation for bleeding prevention using argon plasma was successful. The second portion of the duodenum and third portion of the duodenum were normal. ? ?-Stenosis in the transverse colon at the periumbilical hernia. ?- One 7 mm polyp in the transverse colon. Not removed. ?- A single bleeding transverse colon angiodysplastic lesion. Treated with argon plasma coagulation (APC). ?- The examination was otherwise normal on direct and retroflexion views. ?- Diverticulosis in the left colon. ?- No specimens collected ? ?Oct 2021 EGD ?- Normal esophagus ?- Normal stomach. ?- Three non-bleeding angiodysplastic lesions in the duodenum. Treated with argon plasma coagulation (APC). ?- Three non-bleeding angiodysplastic lesions in the duodenum. Treated with argon plasma coagulation  (APC). ? ?Nov 2021 colonoscopy for GI bleed and anemia ?-Incomplete colonoscopy to the transverse colon. ?- Moderate diverticulosis in the left colon. There was no evidence of diverticular bleeding. ?- Severe stenosis in the transverse colon at the nonreducible periumblical hernia. Unable to safely travserse. ?- Segmental erythematous, friable, granular mucosa in the transverse colon at the periumbical hernia. ?- Internal hemorrhoids. ?- The examination was otherwise normal on direct and retroflexion views. ?- No specimens collected. ? ? ?Recent Labs and Imaging ?CT ABDOMEN PELVIS WO CONTRAST ? ?Result Date: 11/22/2021 ?CLINICAL DATA:  Blood in ostomy bag for several weeks. History of stage IV non small cell lung cancer. EXAM: CT ABDOMEN AND PELVIS WITHOUT CONTRAST TECHNIQUE: Multidetector CT imaging of the abdomen and pelvis was performed following the standard protocol without IV contrast. RADIATION DOSE REDUCTION: This exam was performed according to the departmental dose-optimization program which includes automated exposure control, adjustment of the mA and/or kV according to patient size and/or use of iterative reconstruction technique. COMPARISON:  CT 11/10/2014 chest, CT abdomen pelvis 10/10/2021 FINDINGS: Lower chest: LEFT basilar effusion and atelectasis not changed from  comparison exam. Hepatobiliary: No focal hepatic lesion on noncontrast exam. Pancreas: Pancreas is normal. No ductal dilatation. No pancreatic inflammation. Spleen: Normal spleen Adrenals/urinary tract: Adrenal glands normal. There multiple mixed density lesions LEFT RIGHT kidney not changed from prior. No obstructive uropathy. Ureters and bladder normal Stomach/Bowel: Stomach duodenum normal. Small bowel is normal caliber. No bowel obstruction. RIGHT colon is normal. Midline LEFT colostomy with transverse colon end colostomy. Ventral abdominal wall hernia RIGHT of midline contains loop of nonobstructed small bowel and colon. No  inflammation or obstruction of the colon or small bowel demonstrated. Peristomal hernia contains fat and colon. Residual stool within the rectum. Vascular/Lymphatic: Abdominal aorta is normal caliber with atherosclerotic

## 2021-11-22 NOTE — ED Provider Notes (Signed)
?[place] DEPT ?Provider Note ? ? ?CSN: [id number] ?Arrival date & time: 11/22/21  1045 ? ?  ? ?History ? ?No chief complaint on file. ? ? ?[name] is a 81 y.o. female. ? ?Patient has a history of lung cancer and has AVMs in her colon and stomach with a colostomy bag.  She has been bleeding for the last week and a half.  This seems to be a chronic problem with her ? ? ?Rectal Bleeding ?Quality:  Bright red ?Amount:  Moderate ?Timing:  Constant ?Chronicity:  Recurrent ?Context: not anal fissures   ?Similar prior episodes: no   ?Relieved by:  Nothing ?Worsened by:  Nothing ?Ineffective treatments:  None tried ?Associated symptoms: abdominal pain   ?Risk factors: no anticoagulant use   ? ?  ? ?Home Medications ?Prior to Admission medications   ?Medication Sig Start Date End Date Taking? Authorizing Provider  ?acetaminophen (TYLENOL) 325 MG tablet Take 325-650 mg by mouth daily as needed for moderate pain, fever or headache.     [provider]  ?albuterol (PROAIR HFA) 108 (90 Base) MCG/ACT inhaler Inhale 1-2 puffs into the lungs every 6 (six) hours as needed for wheezing or shortness of breath. 10/30/16   [name], MD  ?allopurinol (ZYLOPRIM) 100 MG tablet TAKE 1 TABLET(100 MG) BY MOUTH DAILY ?Patient taking differently: Take 100 mg by mouth daily. 07/02/21   [name], MD  ?clonazePAM (KLONOPIN) 1 MG tablet Take 1 tablet (1 mg total) by mouth 2 (two) times daily as needed for anxiety. ?Patient taking differently: Take 0.5-1 mg by mouth 2 (two) times daily as needed for anxiety. 06/06/21   [name], MD  ?ferrous sulfate 325 (65 FE) MG tablet Take 1 tablet (325 mg total) by mouth 2 (two) times daily with a meal. 11/13/19   [name], MD  ?fluticasone furoate-vilanterol (BREO ELLIPTA) 100-25 MCG/ACT AEPB Inhale 1 puff into the lungs daily. 09/19/21   [name], MD  ?isosorbide-hydrALAZINE (BIDIL) 20-37.5 MG tablet TAKE 1 TABLET BY MOUTH THREE TIMES  DAILY ?Patient taking differently: Take 1 tablet by mouth in the morning and at bedtime. 05/25/21   [name], MD  ?metoprolol tartrate (LOPRESSOR) 25 MG tablet TAKE 1 TABLET(25 MG) BY MOUTH TWICE DAILY ?Patient taking differently: Take 25 mg by mouth in the morning and at bedtime. 05/16/21   [name], MD  ?morphine (MS CONTIN) 15 MG 12 hr tablet Take 1 tablet (15 mg total) by mouth 2 (two) times daily as needed for pain. 06/15/21   [name], MD  ?Nutritional Supplements (CARNATION BREAKFAST ESSENTIALS) LIQD Take 237 mLs by mouth daily.    [provider]  ?nystatin (MYCOSTATIN/NYSTOP) powder Apply 1 application. topically 3 (three) times daily. 10/24/21   Barrett, Erin R, PA-C  ?osimertinib mesylate (TAGRISSO) 80 MG tablet Take 1 tablet (80 mg total) by mouth daily. 09/05/21   Curt Bears, MD  ?OXYGEN Inhale 2 L/min into the lungs continuous.    [provider]  ?prochlorperazine (COMPAZINE) 10 MG tablet Take 1 tablet (10 mg total) by mouth every 6 (six) hours as needed. ?Patient taking differently: Take 10 mg by mouth every 6 (six) hours as needed for nausea or vomiting. 06/08/21   Heilingoetter, Cassandra L, PA-C  ?protein supplement shake (PREMIER PROTEIN) LIQD Take 11 oz by mouth 2 (two) times daily between meals.    [provider]  ?torsemide (DEMADEX) 20 MG tablet TAKE 2  TABLETS(40 MG) BY MOUTH EVERY MORNING ?Patient taking differently: Take 40 mg by mouth in the morning. 08/21/21   [name], MD  ?   ? ?Allergies    ?Celebrex [celecoxib], Amlodipine besylate, Enalapril, Lipitor [atorvastatin], Trelegy ellipta [fluticasone-umeclidin-vilant], Amoxicillin, Codeine sulfate, Hydrocodone-acetaminophen, and Tape   ? ?Review of Systems   ?Review of Systems  ?Constitutional:  Negative for appetite change and fatigue.  ?HENT:  Negative for congestion, ear discharge and sinus pressure.   ?Eyes:  Negative for discharge.  ?Respiratory:  Negative for cough.    ?Cardiovascular:  Negative for chest pain.  ?Gastrointestinal:  Positive for abdominal pain, blood in stool and hematochezia. Negative for diarrhea.  ?Genitourinary:  Negative for frequency and hematuria.  ?Musculoskeletal:  Negative for back pain.  ?Skin:  Negative for rash.  ?Neurological:  Negative for seizures and headaches.  ?Psychiatric/Behavioral:  Negative for hallucinations.   ? ?Physical Exam ?Updated Vital Signs ?BP (!) 100/36   Pulse 80   Temp 98.3 ?F (36.8 ?C) (Oral)   Resp 19   SpO2 100%  ?Physical Exam ?Vitals and nursing note reviewed.  ?Constitutional:   ?   Appearance: She is well-developed.  ?HENT:  ?   Head: Normocephalic.  ?   Nose: Nose normal.  ?Eyes:  ?   General: No scleral icterus. ?   Conjunctiva/sclera: Conjunctivae normal.  ?Neck:  ?   Thyroid: No thyromegaly.  ?Cardiovascular:  ?   Rate and Rhythm: Normal rate and regular rhythm.  ?   Heart sounds: No murmur heard. ?  No friction rub. No gallop.  ?Pulmonary:  ?   Breath sounds: No stridor. No wheezing or rales.  ?Chest:  ?   Chest wall: No tenderness.  ?Abdominal:  ?   General: There is no distension.  ?   Tenderness: There is abdominal tenderness. There is no rebound.  ?   Comments: Blood in ostomy bag  ?Musculoskeletal:     ?   General: Normal range of motion.  ?   Cervical back: Neck supple.  ?Lymphadenopathy:  ?   Cervical: No cervical adenopathy.  ?Skin: ?   Findings: No erythema or rash.  ?Neurological:  ?   Mental Status: She is alert and oriented to person, place, and time.  ?   Motor: No abnormal muscle tone.  ?   Coordination: Coordination normal.  ?Psychiatric:     ?   Behavior: Behavior normal.  ? ? ?ED Results / Procedures / Treatments   ?Labs ?(all labs ordered are listed, but only abnormal results are displayed) ?Labs Reviewed  ?COMPREHENSIVE METABOLIC PANEL - Abnormal; Notable for the following components:  ?    Result Value  ? Glucose, Bld 134 (*)   ? BUN 63 (*)   ? Creatinine, Ser 2.18 (*)   ? Total Protein 6.0  (*)   ? Albumin 2.9 (*)   ? GFR, Estimated 22 (*)   ? All other components within normal limits  ?CBC - Abnormal; Notable for the following components:  ? RBC 1.75 (*)   ? Hemoglobin 5.1 (*)   ? HCT 16.3 (*)   ? Platelets 137 (*)   ? All other components within normal limits  ?TYPE AND SCREEN  ?PREPARE RBC (CROSSMATCH)  ? ? ?EKG ?None ? ?Radiology ?CT ABDOMEN PELVIS WO CONTRAST ? ?Result Date: 11/22/2021 ?CLINICAL DATA:  Blood in ostomy bag for several weeks. History of stage IV non small cell lung cancer. EXAM: CT ABDOMEN AND PELVIS WITHOUT CONTRAST  TECHNIQUE: Multidetector CT imaging of the abdomen and pelvis was performed following the standard protocol without IV contrast. RADIATION DOSE REDUCTION: This exam was performed according to the departmental dose-optimization program which includes automated exposure control, adjustment of the mA and/or kV according to patient size and/or use of iterative reconstruction technique. COMPARISON:  CT 11/10/2014 chest, CT abdomen pelvis 10/10/2021 FINDINGS: Lower chest: LEFT basilar effusion and atelectasis not changed from comparison exam. Hepatobiliary: No focal hepatic lesion on noncontrast exam. Pancreas: Pancreas is normal. No ductal dilatation. No pancreatic inflammation. Spleen: Normal spleen Adrenals/urinary tract: Adrenal glands normal. There multiple mixed density lesions LEFT RIGHT kidney not changed from prior. No obstructive uropathy. Ureters and bladder normal Stomach/Bowel: Stomach duodenum normal. Small bowel is normal caliber. No bowel obstruction. RIGHT colon is normal. Midline LEFT colostomy with transverse colon end colostomy. Ventral abdominal wall hernia RIGHT of midline contains loop of nonobstructed small bowel and colon. No inflammation or obstruction of the colon or small bowel demonstrated. Peristomal hernia contains fat and colon. Residual stool within the rectum. Vascular/Lymphatic: Abdominal aorta is normal caliber with atherosclerotic  calcification. There is no retroperitoneal or periportal lymphadenopathy. No pelvic lymphadenopathy. Reproductive: Uterus and adnexa unremarkable. Other: No free fluid. Musculoskeletal: No aggressive osseous lesion. I

## 2021-11-22 NOTE — H&P (View-Only) (Signed)
? ?                                            Consultation Note ? ? ?Referring Provider: [facility] ?PCP: Laurie Lima, MD ?Primary Gastroenterologist:  Laurie Edward, MD ?Reason for consultation: GI bleed  ?Hospital Day: 1 ? ? Attending physician's note  ?I have taken a history, reviewed the chart and examined the patient. I performed a substantive portion of this encounter, including complete performance of at least one of the key components, in conjunction with the APP. I agree with the APP's note, impression and recommendations.  ?  ?81 year old female with metastatic lung cancer stage IV, chronic hypoxic respiratory failure, MGUS, CKD stage III, s/p partial colectomy with end colostomy for incarcerated ventral hernia admitted with bleeding through ostomy and worsening anemia.  She has received 6 PRBC within the past 4 weeks, is transfusion dependent ? ?Discussed possible EGD and scope through ostomy to further evaluate etiology of GI blood loss  ? ?Patient is reluctant to proceed.  She does not feel she can drink the bowel prep, feels weak and is also worried about potential risk associated with anesthesia and the procedure.  She wants to hold off endoscopic evaluation at this point ? ?We will leave the bowel prep orders in place in case patient changes her mind overnight ?We will rediscuss with patient tomorrow a.m. ?Continue supportive care ?Transfuse PRBC as needed to maintain hemoglobin >7 ?Palliative care consult to discuss goals of care, consider hospice care ? ? The patient was provided an opportunity to ask questions and all were answered. The patient agreed with the [name] and demonstrated an understanding of the instructions. ? ?[name] , MD ?[phone]    ?ASSESSMENT:  ? ?Chronic GI bleeding (via ostomy bag) / History of both small and large bowel AVMs ?s/p multiple upper and lower endoscopies with APC.  ? ?Acute on chronic anemia.  ?Hx of MGUS but also has chronic intermittent  GI bleeding. Has gotten 6 u PRBC since mid March 2024. Recent hgb~ 8, now down to 5 in setting of actively bleeding.  ? ?History of partial colectomy in 2022  ?Abdominal hernia with incarcerated transverse colon causing severe stenosis   ? ?Stage 4 lung cancer with widespread metastatic disease in thoracic nodes.  ?On [place] ? ?See PMH for additional medical problems ? ? ?[name]:  ?Will [name] for EGD and colostomy thru stoma tomorrow. The risks and benefits of EGD and colonoscopy were discussed with the patient who agrees to proceed.  ?Blood transfusion in progress.  ? ? ?History of Present Illness:  ?[name] is a 81 y.o. female known to [name] with a past medical history significant for stage 4 adenocarcinoma of the left lung with widespread metastatic disease in thoracic nodes, ulcerative colitis, COPD, chronic hypoxic respiratory failure, obesity, CKD3b, MGUS, gout, HTN, PAF, chronic diastolic HF, chronic intermittent GI bleeding with history small bowel and colon, colon polyps, chronic anemia, partial colectomy and end colostomy in 2022 for chronically incarcerated ventral hernia causing compression of transverse colon.  See PMH for any additional medical problems. ? ?[name] was last seen by [place] during a mid March 2023 hospitalization. At that time we evaluated her for chronic loss through ostomy.  She has a history of small bowel and large bowel AVMs. We did not pursue endoscopic evaluation /  treatment at that time. AVMs were last treated at time of EGD and colonoscopy in Oct 2021.    ? ?[name] is followed by Hematology for MGUS. She has received 6 u PRBCs since 10/09/21. Most recently she got 2 units of 4/14 for a hgb of 6. Post transfusion hgb was 8.1 on 4/17. She presents now with a hgb of 5. She has chronic intermittent bleeding into ostomy bag. She says the blood is sometimes dark red, other times bright red. A few minutes prior to onset of bleeding she gets mid abdominal discomfort. Once the  bleeding starts the abdominal discomfort starts to resolve. She has no appetite. Weights in Epic reviewed and stable.  ? ?Of note, in 2009 she she left sided colitis. Biopsies c/w minimally active chronic colitis. She didn't tolerate a trial of mesalamine. We have since looked in colon 3 times without mention of left sided colitis.  ? ?Previous GI Evaluation / History   ? ? ?July 2016 EGD and colonoscopy for GI bleed ?1.Four angiodysplastic lesions at the cecum ?2. Sessile polyp in the ascending colon; polypectomy performed with a cold snare ?3. Mild diverticulosis in the sigmoid colon ?4. Grade l internal hemorrhoids ?Polyp path = tubular adenoma ? ?EGD ?ENDOSCOPIC IMPRESSION: ?1. 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 Small hiatal hernia ? ? ?Jan 2020 EGD and colonoscopy for GI bleed / anemia ?-The examined esophagus was normal. ?-A single 2 mm no bleeding angiodysplastic lesion was found in the gastric antrum. Coagulation for bleeding prevention using argon plasma was successful. The exam of the stomach was otherwise normal. ?A single 3 mm angiodysplastic lesion without bleeding was found in the duodenal bulb. Coagulation for bleeding prevention using argon plasma was successful. The second portion of the duodenum and third portion of the duodenum were normal. ? ?-Stenosis in the transverse colon at the periumbilical hernia. ?- One 7 mm polyp in the transverse colon. Not removed. ?- A single bleeding transverse colon angiodysplastic lesion. Treated with argon plasma coagulation (APC). ?- The examination was otherwise normal on direct and retroflexion views. ?- Diverticulosis in the left colon. ?- No specimens collected ? ?Oct 2021 EGD ?- Normal esophagus ?- Normal stomach. ?- Three non-bleeding angiodysplastic lesions in the duodenum. Treated with argon plasma coagulation (APC). ?- Three non-bleeding angiodysplastic lesions in the duodenum. Treated with argon plasma coagulation  (APC). ? ?Nov 2021 colonoscopy for GI bleed and anemia ?-Incomplete colonoscopy to the transverse colon. ?- Moderate diverticulosis in the left colon. There was no evidence of diverticular bleeding. ?- Severe stenosis in the transverse colon at the nonreducible periumblical hernia. Unable to safely travserse. ?- Segmental erythematous, friable, granular mucosa in the transverse colon at the periumbical hernia. ?- Internal hemorrhoids. ?- The examination was otherwise normal on direct and retroflexion views. ?- No specimens collected. ? ? ?Recent Labs and Imaging ?CT ABDOMEN PELVIS WO CONTRAST ? ?Result Date: 11/22/2021 ?CLINICAL DATA:  Blood in ostomy bag for several weeks. History of stage IV non small cell lung cancer. EXAM: CT ABDOMEN AND PELVIS WITHOUT CONTRAST TECHNIQUE: Multidetector CT imaging of the abdomen and pelvis was performed following the standard protocol without IV contrast. RADIATION DOSE REDUCTION: This exam was performed according to the departmental dose-optimization program which includes automated exposure control, adjustment of the mA and/or kV according to patient size and/or use of iterative reconstruction technique. COMPARISON:  CT 11/10/2014 chest, CT abdomen pelvis 10/10/2021 FINDINGS: Lower chest: LEFT basilar effusion and atelectasis not changed from  comparison exam. Hepatobiliary: No focal hepatic lesion on noncontrast exam. Pancreas: Pancreas is normal. No ductal dilatation. No pancreatic inflammation. Spleen: Normal spleen Adrenals/urinary tract: Adrenal glands normal. There multiple mixed density lesions LEFT RIGHT kidney not changed from prior. No obstructive uropathy. Ureters and bladder normal Stomach/Bowel: Stomach duodenum normal. Small bowel is normal caliber. No bowel obstruction. RIGHT colon is normal. Midline LEFT colostomy with transverse colon end colostomy. Ventral abdominal wall hernia RIGHT of midline contains loop of nonobstructed small bowel and colon. No  inflammation or obstruction of the colon or small bowel demonstrated. Peristomal hernia contains fat and colon. Residual stool within the rectum. Vascular/Lymphatic: Abdominal aorta is normal caliber with atherosclerotic

## 2021-11-22 NOTE — H&P (Addendum)
?History and Physical  ? ? ?Patient: [name] GDJ:[id number] DOB: 03-04-1941 ?DOA: 11/22/2021 ?DOS: the patient was seen and examined on 11/22/2021 ?PCP: [name], MD  ?Patient coming from: Home ? ?Chief Complaint: "I've had blood in my ostomy." ? ?HPI: [name] is a 81 y.o. female with medical history significant of stage 4 adenocarcinoma of the left lung, COPD, chronic hypoxic respiratory failure, CKD3b, gout, HTN, PAF, chronic diastolic HF, recurrent GIB d/t AVMs, s/p colectomy w/ colostomy. Presenting with GIB. She reports that she has seen more blood in her ostomy bag for the last 2 weeks. During this time she had become more fatigued and intermittently short of breath. She has had occasional palpitations. She had not has syncopal episodes. Her weakness progressed to the point where she was unable to get out of bed to the bathroom this morning. She became concerned and called for EMS. She denies any NSAID use. She denies any other aggravating or alleviating factors.   ? ?Review of Systems: As mentioned in the history of present illness. All other systems reviewed and are negative. ?Past Medical History:  ?Diagnosis Date  ? Allergy   ? Angiodysplasia of cecum   ? Angiodysplasia of duodenum   ? Arthritis   ? Asteatotic eczema 02/25/2016  ? Asthma   ? Atrial fibrillation ([place]) 10/17/2017  ? Blood transfusion without reported diagnosis   ? Cancer [facility])   ? Cataract   ? CHF (congestive heart failure), NYHA class III, chronic, combined ([place]) 08/10/2017  ? Colonic polyp 02/16/2008  ? Tubular adenoma  ? COPD (chronic obstructive pulmonary disease) ([place]) 01/27/1998  ? Cor pulmonale ([place]) 11/16/2014  ? GERD 07/30/1992  ? Gout   ? HTN (hypertension) 07/30/1988  ? Hyperlipidemia with target LDL less than 100 11/27/1996  ?     ? Kidney stone 1960, 1972, 1991  ? Medical history non-contributory   ? MGUS (monoclonal gammopathy of unknown significance) 11/08/2016  ? Morbid obesity ([place]) 04/19/2010  ? She  agrees to work on her lifestyle modifications to help her lose weight.   ? OSA (obstructive sleep apnea) 06/22/2013  ? Osteopenia, senile 02/05/2013  ? July 2014  -2.1 left femur -1.9 left forearm   ? Prediabetes 11/13/2006  ? Renal insufficiency   ? Stenosis of cervical spine with myelopathy ([place]) 01/01/2018  ? Supplemental oxygen dependent   ? 2L via [place]  ? Ulcer of leg, chronic, right ([place]) 10/10/2011  ? ULCERATIVE COLITIS-LEFT SIDE 03/19/2008  ?     ? Vitamin B12 deficiency anemia 11/13/2006  ?     ? Vitamin D deficiency 02/06/2013  ? ?Past Surgical History:  ?Procedure Laterality Date  ? BRONCHOSCOPY    ? CHEST TUBE INSERTION Left 06/15/2021  ? Procedure: INSERTION PLEURAL DRAINAGE CATHETER;  Surgeon: [name], MD;  Location: [place];  Service: Thoracic;  Laterality: Left;  ? COLONOSCOPY    ? COLONOSCOPY WITH PROPOFOL N/A 02/23/2015  ? Procedure: COLONOSCOPY WITH PROPOFOL;  Surgeon: Ladene Artist, MD;  Location: [facility];  Service: Endoscopy;  Laterality: N/A;  ? COLONOSCOPY WITH PROPOFOL N/A 08/12/2018  ? Procedure: COLONOSCOPY WITH PROPOFOL;  Surgeon: Ladene Artist, MD;  [facility];  Service: Endoscopy;  Laterality: N/A;  ? COLONOSCOPY WITH PROPOFOL N/A 05/09/2020  ? Procedure: COLONOSCOPY WITH PROPOFOL;  Surgeon: Ladene Artist, MD;  [facility];  Service: Endoscopy;  Laterality: N/A;  To Splenic Flexture  ? ESOPHAGOGASTRODUODENOSCOPY (EGD) WITH PROPOFOL N/A 02/23/2015  ?  Procedure: ESOPHAGOGASTRODUODENOSCOPY (EGD) WITH PROPOFOL;  Surgeon: Ladene Artist, MD;  [facility];  Service: Endoscopy;  Laterality: N/A;  ? ESOPHAGOGASTRODUODENOSCOPY (EGD) WITH PROPOFOL N/A 08/12/2018  ? Procedure: ESOPHAGOGASTRODUODENOSCOPY (EGD) WITH PROPOFOL;  Surgeon: Ladene Artist, MD;  [facility];  Service: Endoscopy;  Laterality: N/A;  ? ESOPHAGOGASTRODUODENOSCOPY (EGD) WITH PROPOFOL N/A 05/08/2020  ? Procedure: ESOPHAGOGASTRODUODENOSCOPY (EGD) WITH PROPOFOL;   Surgeon: Yetta Flock, MD;  [facility];  Service: Gastroenterology;  Laterality: N/A;  ? HOT HEMOSTASIS N/A 08/12/2018  ? Procedure: HOT HEMOSTASIS (ARGON PLASMA COAGULATION/BICAP);  Surgeon: Ladene Artist, MD;  [facility];  Service: Endoscopy;  Laterality: N/A;  EGD and Colon APC  ? HOT HEMOSTASIS N/A 05/08/2020  ? Procedure: HOT HEMOSTASIS (ARGON PLASMA COAGULATION/BICAP);  Surgeon: Yetta Flock, MD;  Location: [facility];  Service: Gastroenterology;  Laterality: N/A;  ? LAPAROTOMY N/A 08/11/2020  ? Procedure: EXPLORATORY LAPAROTOMY PARTIAL COLECTOMY AND COLOSTOMY WITH RESECTION OF A MESSENTERIC MASS;  Surgeon: Coralie Keens, MD;  [facility];  Service: General;  Laterality: N/A;  ? POLYPECTOMY    ? REMOVAL OF PLEURAL DRAINAGE CATHETER N/A 10/17/2021  ? Procedure: MINOR REMOVAL OF PLEURAL DRAINAGE CATHETER;  Surgeon: [name], MD;  Location: [place];  Service: Cardiopulmonary;  Laterality: N/A;  ? ?Social History:  reports that she quit smoking about 25 years ago. Her smoking use included cigarettes. She has never used smokeless tobacco. She reports that she does not drink alcohol and does not use drugs. ? ?Allergies  ?Allergen Reactions  ? Celebrex [Celecoxib] Swelling and Other (See Comments)  ?  Ankles swell  ? Amlodipine Besylate Swelling and Other (See Comments)  ?  Ankles swell  ? Enalapril Cough  ? Lipitor [Atorvastatin]   ?  Muscle aches  ? Trelegy Ellipta [Fluticasone-Umeclidin-Vilant] Other (See Comments)  ?  Made the throat and esophagus "hurt"  ? Amoxicillin Diarrhea  ?  DID THE REACTION INVOLVE: Swelling of the face/tongue/throat, SOB, or low BP? N ?Sudden or severe rash/hives, skin peeling, or the inside of the mouth or nose? N ?Did it require medical treatment? N ?When did it last happen?      MORE THAN 10 YEARS ?If all above answers are "NO", may proceed with cephalosporin use. ?  ? Codeine Sulfate Nausea Only  ?  Hydrocodone-Acetaminophen Nausea Only  ? Tape Itching, Rash and Other (See Comments)  ?  Redness, also  ? ? ?Family History  ?Problem Relation Age of Onset  ? Stroke Mother   ? Diabetes Mother   ? Kidney cancer Mother   ? Stomach cancer Mother   ? Heart disease Mother   ? Stroke Father   ? Heart disease Brother   ? Heart disease Brother   ? Crohn's disease Brother   ? Heart disease Sister   ?     CATH,STENT  ? Clotting disorder Sister   ? Cervical cancer Sister   ? Lung cancer Sister   ? Heart attack Sister   ? Diabetes Sister   ? Colon cancer Neg Hx   ? Esophageal cancer Neg Hx   ? Rectal cancer Neg Hx   ? ? ?Prior to Admission medications   ?Medication Sig Start Date End Date Taking? Authorizing Provider  ?acetaminophen (TYLENOL) 325 MG tablet Take 325-650 mg by mouth daily as needed for moderate pain, fever or headache.     [provider]  ?albuterol (PROAIR HFA) 108 (90 Base) MCG/ACT inhaler Inhale 1-2 puffs  into the lungs every 6 (six) hours as needed for wheezing or shortness of breath. 10/30/16   [name], MD  ?allopurinol (ZYLOPRIM) 100 MG tablet TAKE 1 TABLET(100 MG) BY MOUTH DAILY ?Patient taking differently: Take 100 mg by mouth daily. 07/02/21   [name], MD  ?clonazePAM (KLONOPIN) 1 MG tablet Take 1 tablet (1 mg total) by mouth 2 (two) times daily as needed for anxiety. ?Patient taking differently: Take 0.5-1 mg by mouth 2 (two) times daily as needed for anxiety. 06/06/21   [name], MD  ?ferrous sulfate 325 (65 FE) MG tablet Take 1 tablet (325 mg total) by mouth 2 (two) times daily with a meal. 11/13/19   [name], MD  ?fluticasone furoate-vilanterol (BREO ELLIPTA) 100-25 MCG/ACT AEPB Inhale 1 puff into the lungs daily. 09/19/21   [name], MD  ?isosorbide-hydrALAZINE (BIDIL) 20-37.5 MG tablet TAKE 1 TABLET BY MOUTH THREE TIMES DAILY ?Patient taking differently: Take 1 tablet by mouth in the morning and at bedtime. 05/25/21   [name], MD  ?metoprolol  tartrate (LOPRESSOR) 25 MG tablet TAKE 1 TABLET(25 MG) BY MOUTH TWICE DAILY ?Patient taking differently: Take 25 mg by mouth in the morning and at bedtime. 05/16/21   [name], MD  ?morphine (MS CONTIN) 15 MG 12 hr tabl

## 2021-11-22 NOTE — TOC Initial Note (Addendum)
Transition of Care (TOC) - Initial/Assessment Note  ? ? ?Patient Details  ?Name: [name] ?MRN: [id number] ?Date of Birth: October 21, 1940 ? ?Transition of Care (TOC) CM/SW Contact:    ?Tawanna Cooler, RN ?Phone Number: ?11/22/2021, 4:26 PM ? ?Clinical Narrative:                 ? ?Patient from home, lives with family.  Has an ostomy, lung cancer with mets. On chemo. Home O2 at 2L through [place].  Home palliative with [facility].  Has been on service with [facility] previously, but is no longer active.  ?TOC following for discharge needs.  ? ? ?Expected Discharge Plan: [place] ?Barriers to Discharge: Continued Medical Work up ? ? ?Expected Discharge Plan and Services ?Expected Discharge Plan: [place] ?  ?   ?Living arrangements for the past 2 months: [place] ?                ?  ?Prior Living Arrangements/Services ?Living arrangements for the past 2 months: [place] ?Lives with:: Adult Children ?Patient language and need for interpreter reviewed:: Yes ?       ?Need for Family Participation in Patient Care: Yes (Comment) ?Care giver support system in place?: Yes (comment) ?Current home services: DME (home O2) ?Criminal Activity/Legal Involvement Pertinent to Current Situation/Hospitalization: No - Comment as needed ? ?Emotional Assessment ? ?Orientation: : Oriented to Self, Oriented to Place, Oriented to Situation, Oriented to  Time ?Alcohol / Substance Use: Not Applicable ?Psych Involvement: No (comment) ? ?Admission diagnosis:  GIB (gastrointestinal bleeding) [K92.2] ?Lower GI bleed [K92.2] ?Patient Active Problem List  ? Diagnosis Date Noted  ? GIB (gastrointestinal bleeding) 11/22/2021  ? Chronic pain 11/22/2021  ? Anxiety 11/22/2021  ? Hypotension 11/22/2021  ? Gastrointestinal hemorrhage 10/09/2021  ? Encounter for therapeutic drug monitoring 08/16/2021  ? Renal mass 06/28/2021  ? Adenocarcinoma of left lung, stage 4 ([place]) 06/08/2021  ? Metastatic  adenocarcinoma ([place]) 05/21/2021  ? Recurrent pleural effusion 05/05/2021  ? Normocytic anemia 05/04/2021  ? Irreducible ventral hernia 08/09/2020  ? COPD (chronic obstructive pulmonary disease) ([place]) 08/09/2020  ? GI bleed 05/08/2020  ? Chronic respiratory failure with hypoxia ([place]) 02/06/2020  ? Routine general medical examination at a health care facility 02/06/2020  ? AVM (arteriovenous malformation) of small bowel, acquired with hemorrhage 08/14/2018  ? Stenosis of cervical spine with myelopathy ([place]) 01/01/2018  ? Paroxysmal atrial fibrillation ([place]) 10/17/2017  ? CHF (congestive heart failure), NYHA class III, chronic, diastolic ([place]) 56/81/2751  ? Symptomatic acute on chronic blood loss anemia in patient with history of AVM 08/10/2017  ? Angiodysplasia of cecum   ? Angiodysplasia of duodenum   ? Grade III diastolic dysfunction 70/07/7492  ? Dysuria 08/08/2017  ? AKI (acute kidney injury) ([place]) 11/08/2016  ? MGUS (monoclonal gammopathy of unknown significance) 11/08/2016  ? Asteatotic eczema 02/25/2016  ? COLD (chronic obstructive lung disease) ([place])   ? Anemia, iron deficiency 11/16/2014  ? Cor pulmonale ([place]) 11/16/2014  ? OSA (obstructive sleep apnea) 06/22/2013  ? Chronic venous stasis dermatitis of both lower extremities 06/02/2013  ? Vitamin D deficiency 02/06/2013  ? Osteopenia, senile 02/05/2013  ? Other screening mammogram 02/05/2013  ? Chronic kidney disease, stage 3b ([place]) 06/20/2010  ? Morbid obesity ([place]) 04/19/2010  ? Gout 01/10/2009  ? ULCERATIVE COLITIS-LEFT SIDE 03/19/2008  ? Vitamin B12 deficiency anemia 11/13/2006  ? Hyperlipidemia with target LDL less than 100 11/27/1996  ?  GERD 07/30/1992  ? HTN (hypertension) 07/30/1988  ? ?PCP:  [name], MD ?Pharmacy:   ?[facility] [address] ?[place] [address] ?Phone: [phone] Fax: [phone] ? ?Readmission Risk Interventions ? ?  11/22/2021  ?   4:25 PM 10/11/2021  ? 10:30 AM  ?Readmission Risk Prevention Plan  ?Transportation Screening Complete Complete  ?PCP or Specialist Appt within 3-5 Days Complete Complete  ?[place] or Home Care Consult Complete Complete  ?Social Work Consult for [place] Planning/Counseling Complete Complete  ?Palliative Care Screening Complete Complete  ?Medication Review Press photographer)  Complete  ? ? ? ?

## 2021-11-22 NOTE — ED Triage Notes (Signed)
Pt here via ems from home with reports of blood in her ostomy bag for approx 1,5 weeks. Pt with minimal pain around the site. Hx lung CA on home oxygen. VSS with ems.  ?

## 2021-11-22 NOTE — Consult Note (Signed)
? ? ? ? ?Consult Note ? ?[name] ?[birth year]  ?[id number].   ? ?Requesting MD: Milton Ferguson, MD ?Chief Complaint/Reason for Consult: bloody ostomy output  ?HPI:  ?Patient is an 81 year old female who is s/p partial colectomy and end colostomy 08/11/20 for incarcerated ventral hernia with transverse colon stricture and mesenteric mass. Pathology from this was benign. She also has a history of intestinal AVMs with occasional GI bleeding and Hx of UC. Patient reports that over the last 1.5 weeks she has had a few episodes of blood from stoma. She reports she has received transfused on 4/4 and 4/14 by oncology for the same. She is currently on chemotherapy for her metastatic lung cancer. She saw Dr. Delene Ruffini 4/17. She does report recent respiratory illness over the last week that several family members have also had. They tested for COVID at home and were all negative. She thought she may have been febrile.  She notes that hernias sometimes feel firm but are currently soft. They have gradually enlarged since initial surgery in January of 2022. She reports again that at this time she would really prefer not to have another abdominal surgery given multiple co-morbidities including metastatic lung cancer. She is concerned that she either would not survive further abdominal surgery or would not do well after. I discussed with her again that my main concerns would be in regards to quality of life after surgery. I think she would almost certainly be high risk for surgical complications and prolonged hospitalization would be very likely. She would be high risk for needing SNF given general deconditioning. Palliative care has seen her at home and is already engaged in her care currently.  ?  ?PMH otherwise significant for NSCLC with mets to bone and left pleura and lymph nodes, COPD, chronic respiratory failure on home O2, recurrent malignant pleural effusion with Pleurex catheter s/p removal, CKD stage III, CHF, HTN,  HLD, MGUS, gout, paroxysmal A. Fib, OSA and obesity. No other previous abdominal surgery. She is not on blood thinners given chronic GI bleeding. She quit smoking in the late 1990s and denies alcohol or illicit drug use.  ? ?ROS: ?Negative other than HPI ? ?Family History  ?Problem Relation Age of Onset  ? Stroke Mother   ? Diabetes Mother   ? Kidney cancer Mother   ? Stomach cancer Mother   ? Heart disease Mother   ? Stroke Father   ? Heart disease Brother   ? Heart disease Brother   ? Crohn's disease Brother   ? Heart disease Sister   ?     CATH,STENT  ? Clotting disorder Sister   ? Cervical cancer Sister   ? Lung cancer Sister   ? Heart attack Sister   ? Diabetes Sister   ? Colon cancer Neg Hx   ? Esophageal cancer Neg Hx   ? Rectal cancer Neg Hx   ? ? ?Past Medical History:  ?Diagnosis Date  ? Allergy   ? Angiodysplasia of cecum   ? Angiodysplasia of duodenum   ? Arthritis   ? Asteatotic eczema 02/25/2016  ? Asthma   ? Atrial fibrillation ([place]) 10/17/2017  ? Blood transfusion without reported diagnosis   ? Cancer [facility])   ? Cataract   ? CHF (congestive heart failure), NYHA class III, chronic, combined ([place]) 08/10/2017  ? Colonic polyp 02/16/2008  ? Tubular adenoma  ? COPD (chronic obstructive pulmonary disease) ([place]) 01/27/1998  ? Cor pulmonale ([place]) 11/16/2014  ? GERD 07/30/1992  ?  Gout   ? HTN (hypertension) 07/30/1988  ? Hyperlipidemia with target LDL less than 100 11/27/1996  ?     ? Kidney stone 1960, 1972, 1991  ? Medical history non-contributory   ? MGUS (monoclonal gammopathy of unknown significance) 11/08/2016  ? Morbid obesity ([place]) 04/19/2010  ? She agrees to work on her lifestyle modifications to help her lose weight.   ? OSA (obstructive sleep apnea) 06/22/2013  ? Osteopenia, senile 02/05/2013  ? July 2014  -2.1 left femur -1.9 left forearm   ? Prediabetes 11/13/2006  ? Renal insufficiency   ? Stenosis of cervical spine with myelopathy ([place]) 01/01/2018  ? Supplemental oxygen dependent   ? 2L via [place]   ? Ulcer of leg, chronic, right ([place]) 10/10/2011  ? ULCERATIVE COLITIS-LEFT SIDE 03/19/2008  ?     ? Vitamin B12 deficiency anemia 11/13/2006  ?     ? Vitamin D deficiency 02/06/2013  ? ? ?Past Surgical History:  ?Procedure Laterality Date  ? BRONCHOSCOPY    ? CHEST TUBE INSERTION Left 06/15/2021  ? Procedure: INSERTION PLEURAL DRAINAGE CATHETER;  Surgeon: [name], MD;  Location: [facility];  Service: Thoracic;  Laterality: Left;  ? COLONOSCOPY    ? COLONOSCOPY WITH PROPOFOL N/A 02/23/2015  ? Procedure: COLONOSCOPY WITH PROPOFOL;  Surgeon: Ladene Artist, MD;  Location: [facility];  Service: Endoscopy;  Laterality: N/A;  ? COLONOSCOPY WITH PROPOFOL N/A 08/12/2018  ? Procedure: COLONOSCOPY WITH PROPOFOL;  Surgeon: Ladene Artist, MD;  [facility];  Service: Endoscopy;  Laterality: N/A;  ? COLONOSCOPY WITH PROPOFOL N/A 05/09/2020  ? Procedure: COLONOSCOPY WITH PROPOFOL;  Surgeon: Ladene Artist, MD;  [facility];  Service: Endoscopy;  Laterality: N/A;  To Splenic Flexture  ? ESOPHAGOGASTRODUODENOSCOPY (EGD) WITH PROPOFOL N/A 02/23/2015  ? Procedure: ESOPHAGOGASTRODUODENOSCOPY (EGD) WITH PROPOFOL;  Surgeon: Ladene Artist, MD;  [facility];  Service: Endoscopy;  Laterality: N/A;  ? ESOPHAGOGASTRODUODENOSCOPY (EGD) WITH PROPOFOL N/A 08/12/2018  ? Procedure: ESOPHAGOGASTRODUODENOSCOPY (EGD) WITH PROPOFOL;  Surgeon: Ladene Artist, MD;  [facility];  Service: Endoscopy;  Laterality: N/A;  ? ESOPHAGOGASTRODUODENOSCOPY (EGD) WITH PROPOFOL N/A 05/08/2020  ? Procedure: ESOPHAGOGASTRODUODENOSCOPY (EGD) WITH PROPOFOL;  Surgeon: Yetta Flock, MD;  [facility];  Service: Gastroenterology;  Laterality: N/A;  ? HOT HEMOSTASIS N/A 08/12/2018  ? Procedure: HOT HEMOSTASIS (ARGON PLASMA COAGULATION/BICAP);  Surgeon: Ladene Artist, MD;  [facility];  Service: Endoscopy;  Laterality: N/A;  EGD and Colon APC  ? HOT HEMOSTASIS N/A 05/08/2020  ?  Procedure: HOT HEMOSTASIS (ARGON PLASMA COAGULATION/BICAP);  Surgeon: Yetta Flock, MD;  Location: [facility];  Service: Gastroenterology;  Laterality: N/A;  ? LAPAROTOMY N/A 08/11/2020  ? Procedure: EXPLORATORY LAPAROTOMY PARTIAL COLECTOMY AND COLOSTOMY WITH RESECTION OF A MESSENTERIC MASS;  Surgeon: Coralie Keens, MD;  [facility];  Service: General;  Laterality: N/A;  ? POLYPECTOMY    ? REMOVAL OF PLEURAL DRAINAGE CATHETER N/A 10/17/2021  ? Procedure: MINOR REMOVAL OF PLEURAL DRAINAGE CATHETER;  Surgeon: [name], MD;  Location: [facility];  Service: Cardiopulmonary;  Laterality: N/A;  ? ? ?Social History:  reports that she quit smoking about 25 years ago. Her smoking use included cigarettes. She has never used smokeless tobacco. She reports that she does not drink alcohol and does not use drugs. ? ?Allergies:  ?Allergies  ?Allergen Reactions  ? Celebrex [Celecoxib] Swelling and Other (See Comments)  ?  Ankles swell  ? Amlodipine Besylate Swelling and Other (  See Comments)  ?  Ankles swell  ? Enalapril Cough  ? Lipitor [Atorvastatin]   ?  Muscle aches  ? Trelegy Ellipta [Fluticasone-Umeclidin-Vilant] Other (See Comments)  ?  Made the throat and esophagus "hurt"  ? Amoxicillin Diarrhea  ?  DID THE REACTION INVOLVE: Swelling of the face/tongue/throat, SOB, or low BP? N ?Sudden or severe rash/hives, skin peeling, or the inside of the mouth or nose? N ?Did it require medical treatment? N ?When did it last happen?      MORE THAN 10 YEARS ?If all above answers are "NO", may proceed with cephalosporin use. ?  ? Codeine Sulfate Nausea Only  ? Hydrocodone-Acetaminophen Nausea Only  ? Tape Itching, Rash and Other (See Comments)  ?  Redness, also  ? ? ?(Not in a hospital admission) ? ? ?Blood pressure (!) 101/46, pulse 91, temperature 98.7 ?F (37.1 ?C), resp. rate (!) 23, SpO2 100 %. ?Physical Exam:  ?General: pleasant, WD, chronically ill appearing female  ?HEENT: head is normocephalic,  atraumatic.  Sclera are noninjected.  Pupils equal and round.  Ears and nose without any masses or lesions.  Mouth is pink and moist ?Heart: regular, rate, and rhythm.  ?Lungs: productive cough, wheezing, on sup

## 2021-11-22 NOTE — ED Notes (Signed)
BP noted to be low. Pt a/o per her normal. Small BP cuff placed and new VS obtained. BP diastolic still low.  ?

## 2021-11-23 ENCOUNTER — Inpatient Hospital Stay (HOSPITAL_COMMUNITY): Payer: Medicare Other | Admitting: Anesthesiology

## 2021-11-23 ENCOUNTER — Encounter (HOSPITAL_COMMUNITY): Payer: Self-pay | Admitting: Internal Medicine

## 2021-11-23 ENCOUNTER — Encounter (HOSPITAL_COMMUNITY)
Admission: EM | Disposition: A | Discharge status: Home Health | Payer: Self-pay | Source: Home / Self Care | Attending: Student

## 2021-11-23 DIAGNOSIS — E669 Obesity, unspecified: Secondary | ICD-10-CM

## 2021-11-23 DIAGNOSIS — I5032 Chronic diastolic (congestive) heart failure: Secondary | ICD-10-CM

## 2021-11-23 DIAGNOSIS — D62 Acute posthemorrhagic anemia: Secondary | ICD-10-CM

## 2021-11-23 DIAGNOSIS — K5521 Angiodysplasia of colon with hemorrhage: Secondary | ICD-10-CM

## 2021-11-23 DIAGNOSIS — J432 Centrilobular emphysema: Secondary | ICD-10-CM

## 2021-11-23 DIAGNOSIS — K449 Diaphragmatic hernia without obstruction or gangrene: Secondary | ICD-10-CM

## 2021-11-23 DIAGNOSIS — I5042 Chronic combined systolic (congestive) and diastolic (congestive) heart failure: Secondary | ICD-10-CM

## 2021-11-23 DIAGNOSIS — D472 Monoclonal gammopathy: Secondary | ICD-10-CM

## 2021-11-23 DIAGNOSIS — Z87891 Personal history of nicotine dependence: Secondary | ICD-10-CM

## 2021-11-23 DIAGNOSIS — N179 Acute kidney failure, unspecified: Secondary | ICD-10-CM

## 2021-11-23 DIAGNOSIS — N1832 Chronic kidney disease, stage 3b: Secondary | ICD-10-CM

## 2021-11-23 DIAGNOSIS — D649 Anemia, unspecified: Secondary | ICD-10-CM

## 2021-11-23 DIAGNOSIS — K436 Other and unspecified ventral hernia with obstruction, without gangrene: Secondary | ICD-10-CM

## 2021-11-23 DIAGNOSIS — J9611 Chronic respiratory failure with hypoxia: Secondary | ICD-10-CM

## 2021-11-23 DIAGNOSIS — C3492 Malignant neoplasm of unspecified part of left bronchus or lung: Secondary | ICD-10-CM

## 2021-11-23 DIAGNOSIS — F419 Anxiety disorder, unspecified: Secondary | ICD-10-CM

## 2021-11-23 DIAGNOSIS — G894 Chronic pain syndrome: Secondary | ICD-10-CM

## 2021-11-23 DIAGNOSIS — K6381 Dieulafoy lesion of intestine: Secondary | ICD-10-CM

## 2021-11-23 DIAGNOSIS — Z7189 Other specified counseling: Secondary | ICD-10-CM

## 2021-11-23 DIAGNOSIS — I480 Paroxysmal atrial fibrillation: Secondary | ICD-10-CM

## 2021-11-23 DIAGNOSIS — K922 Gastrointestinal hemorrhage, unspecified: Secondary | ICD-10-CM | POA: Diagnosis not present

## 2021-11-23 DIAGNOSIS — I110 Hypertensive heart disease with heart failure: Secondary | ICD-10-CM

## 2021-11-23 DIAGNOSIS — I10 Essential (primary) hypertension: Secondary | ICD-10-CM

## 2021-11-23 HISTORY — PX: ESOPHAGOGASTRODUODENOSCOPY: SHX5428

## 2021-11-23 HISTORY — PX: HOT HEMOSTASIS: SHX5433

## 2021-11-23 HISTORY — PX: HEMOSTASIS CLIP PLACEMENT: SHX6857

## 2021-11-23 HISTORY — PX: COLONOSCOPY WITH PROPOFOL: SHX5780

## 2021-11-23 LAB — COMPREHENSIVE METABOLIC PANEL
ALT: 6 U/L (ref 0–44)
AST: 19 U/L (ref 15–41)
Albumin: 2.6 g/dL — ABNORMAL LOW (ref 3.5–5.0)
Alkaline Phosphatase: 35 U/L — ABNORMAL LOW (ref 38–126)
Anion gap: 1 — ABNORMAL LOW (ref 5–15)
BUN: 43 mg/dL — ABNORMAL HIGH (ref 8–23)
CO2: 25 mmol/L (ref 22–32)
Calcium: 8.5 mg/dL — ABNORMAL LOW (ref 8.9–10.3)
Chloride: 111 mmol/L (ref 98–111)
Creatinine, Ser: 1.8 mg/dL — ABNORMAL HIGH (ref 0.44–1.00)
GFR, Estimated: 28 mL/min — ABNORMAL LOW (ref >60)
Glucose, Bld: 107 mg/dL — ABNORMAL HIGH (ref 70–99)
Potassium: 5 mmol/L (ref 3.5–5.1)
Sodium: 137 mmol/L (ref 135–145)
Total Bilirubin: 0.9 mg/dL (ref 0.3–1.2)
Total Protein: 5.5 g/dL — ABNORMAL LOW (ref 6.5–8.1)

## 2021-11-23 LAB — PROTIME-INR
INR: 1.1 (ref 0.8–1.2)
Prothrombin Time: 14.2 seconds (ref 11.4–15.2)

## 2021-11-23 LAB — CBC WITH DIFFERENTIAL/PLATELET
Abs Immature Granulocytes: 0.1 10*3/uL — ABNORMAL HIGH (ref 0.00–0.07)
Basophils Absolute: 0 10*3/uL (ref 0.0–0.1)
Basophils Relative: 0 %
Eosinophils Absolute: 0.1 10*3/uL (ref 0.0–0.5)
Eosinophils Relative: 1 %
HCT: 27.4 % — ABNORMAL LOW (ref 36.0–46.0)
Hemoglobin: 8.9 g/dL — ABNORMAL LOW (ref 12.0–15.0)
Immature Granulocytes: 1 %
Lymphocytes Relative: 12 %
Lymphs Abs: 1 10*3/uL (ref 0.7–4.0)
MCH: 29.1 pg (ref 26.0–34.0)
MCHC: 32.5 g/dL (ref 30.0–36.0)
MCV: 89.5 fL (ref 80.0–100.0)
Monocytes Absolute: 0.9 10*3/uL (ref 0.1–1.0)
Monocytes Relative: 11 %
Neutro Abs: 6.5 10*3/uL (ref 1.7–7.7)
Neutrophils Relative %: 75 %
Platelets: 106 10*3/uL — ABNORMAL LOW (ref 150–400)
RBC: 3.06 MIL/uL — ABNORMAL LOW (ref 3.87–5.11)
RDW: 15.2 % (ref 11.5–15.5)
WBC: 8.6 10*3/uL (ref 4.0–10.5)
nRBC: 0 % (ref 0.0–0.2)

## 2021-11-23 LAB — HEMOGLOBIN AND HEMATOCRIT, BLOOD
HCT: 24.8 % — ABNORMAL LOW (ref 36.0–46.0)
HCT: 27.9 % — ABNORMAL LOW (ref 36.0–46.0)
HCT: 24.5 % — ABNORMAL LOW (ref 36.0–46.0)
Hemoglobin: 7.9 g/dL — ABNORMAL LOW (ref 12.0–15.0)
Hemoglobin: 8.8 g/dL — ABNORMAL LOW (ref 12.0–15.0)
Hemoglobin: 7.8 g/dL — ABNORMAL LOW (ref 12.0–15.0)

## 2021-11-23 SURGERY — COLONOSCOPY WITH PROPOFOL
Anesthesia: Monitor Anesthesia Care

## 2021-11-23 MED ORDER — LEVALBUTEROL HCL 0.63 MG/3ML IN NEBU
0.6300 mg | INHALATION_SOLUTION | Freq: Four times a day (QID) | RESPIRATORY_TRACT | Status: DC | PRN
  Administered 2021-11-24: 0.63 mg via RESPIRATORY_TRACT
  Filled 2021-11-23: qty 3

## 2021-11-23 MED ORDER — PHENYLEPHRINE HCL (PRESSORS) 10 MG/ML IV SOLN
INTRAVENOUS | Status: DC | PRN
  Administered 2021-11-23: 160 ug via INTRAVENOUS
  Administered 2021-11-23: 80 ug via INTRAVENOUS
  Administered 2021-11-23 (×2): 160 ug via INTRAVENOUS

## 2021-11-23 MED ORDER — IPRATROPIUM BROMIDE 0.02 % IN SOLN
0.5000 mg | Freq: Four times a day (QID) | RESPIRATORY_TRACT | Status: DC | PRN
  Filled 2021-11-23: qty 2.5

## 2021-11-23 MED ORDER — ACETAMINOPHEN 325 MG PO TABS
650.0000 mg | ORAL_TABLET | Freq: Four times a day (QID) | ORAL | Status: DC | PRN
  Administered 2021-11-23: 650 mg via ORAL
  Filled 2021-11-23: qty 2

## 2021-11-23 MED ORDER — ACETAMINOPHEN 650 MG RE SUPP: 650.0000 mg | Freq: Four times a day (QID) | RECTAL | Status: DC | PRN

## 2021-11-23 MED ORDER — PROPOFOL 500 MG/50ML IV EMUL
INTRAVENOUS | Status: DC | PRN
  Administered 2021-11-23: 150 ug/kg/min via INTRAVENOUS

## 2021-11-23 MED ORDER — ACETAMINOPHEN 325 MG PO TABS
650.0000 mg | ORAL_TABLET | Freq: Once | ORAL | Status: AC | PRN
  Administered 2021-11-23: 650 mg via ORAL
  Filled 2021-11-23: qty 2

## 2021-11-23 MED ORDER — FLUTICASONE FUROATE-VILANTEROL 100-25 MCG/ACT IN AEPB
1.0000 | INHALATION_SPRAY | Freq: Every day | RESPIRATORY_TRACT | Status: DC
  Administered 2021-11-24 – 2021-11-26 (×3): 1 via RESPIRATORY_TRACT
  Filled 2021-11-23: qty 28

## 2021-11-23 MED ORDER — LACTATED RINGERS IV SOLN
INTRAVENOUS | Status: DC
Start: 2021-11-23 — End: 2021-11-23
  Administered 2021-11-23: 1000 mL via INTRAVENOUS

## 2021-11-23 MED ORDER — IPRATROPIUM BROMIDE 0.02 % IN SOLN: 0.5000 mg | Freq: Four times a day (QID) | RESPIRATORY_TRACT | Status: DC | PRN

## 2021-11-23 MED ORDER — LEVALBUTEROL HCL 0.63 MG/3ML IN NEBU: 0.6300 mg | INHALATION_SOLUTION | Freq: Four times a day (QID) | RESPIRATORY_TRACT | Status: DC | PRN

## 2021-11-23 SURGICAL SUPPLY — 22 items

## 2021-11-23 NOTE — Assessment & Plan Note (Addendum)
Per surgery, no indication for surgical intervention.  Patient not interested either. ?

## 2021-11-23 NOTE — Assessment & Plan Note (Addendum)
Followed by Dr. Julien Nordmann.  On [place] ?-Oncology added to treatment team. ?-Continue home [place]. ?

## 2021-11-23 NOTE — Assessment & Plan Note (Signed)
Body mass index is 32.89 kg/m?Marland Kitchen ? ?

## 2021-11-23 NOTE — Assessment & Plan Note (Addendum)
Recent Labs  ?  11/22/21 ?1110 11/23/21 ?0019 11/23/21 ?1281 11/23/21 ?1528 11/23/21 ?2020 11/24/21 ?0422 11/24/21 ?1641 11/25/21 ?1886 11/25/21 ?1949 11/26/21 ?0319  ?HGB 5.1* 8.9* 7.9* 8.8* 7.8* 8.1* 7.7* 7.6* 8.6* 8.4*  ?Transfused 5 units total.  Colonoscopy with bleeding AVM and Dieulafoy lesion that was treated with APC and MR conditional clips.  EGD without significant finding.  Bleeding resolved.  Tolerating soft diet. ?-Recheck CBC in 1 to 2 weeks ?-Patient to continue soft diet over the next 3 to 4 days ?

## 2021-11-23 NOTE — Assessment & Plan Note (Addendum)
TTE with LVEF of 70 to 75%, no RWMA but G2 DD, severe LAE, moderate MVR and normal RVSP.  On torsemide 40 mg daily at home.  Appears euvolemic on exam. ?-Continue home medications. ?-Outpatient follow-up with cardiology as previously planned ?

## 2021-11-23 NOTE — Anesthesia Preprocedure Evaluation (Signed)
Anesthesia Evaluation  ?Patient identified by MRN, date of birth, ID band ?Patient awake ? ? ? ?Reviewed: ?Allergy & Precautions, H&P , NPO status , Patient's Chart, lab work & pertinent test results ? ?Airway ?Mallampati: II ? ?TM Distance: >3 FB ?Neck ROM: Full ? ? ? Dental ?no notable dental hx. ?(+) Edentulous Upper, Dental Advisory Given, Missing ?  ?Pulmonary ?asthma , sleep apnea , COPD,  oxygen dependent, former smoker,  ?  ?Pulmonary exam normal ?breath sounds clear to auscultation ?+ decreased breath sounds ? ? ? ? ? Cardiovascular ?hypertension, +CHF  ?+ dysrhythmias Atrial Fibrillation  ?Rhythm:Irregular Rate:Normal ? ?Echo 05/29/20: ?IMPRESSIONS  ??1. Left ventricular ejection fraction, by estimation, is 60 to 65%. The  ?left ventricle has normal function. The left ventricle has no regional  ?wall motion abnormalities. There is moderate concentric left ventricular  ?hypertrophy. Left ventricular  ?diastolic parameters are consistent with Grade II diastolic dysfunction  ?(pseudonormalization).  ??2. Right ventricular systolic function is normal. The right ventricular  ?size is normal.  ??3. Left atrial size was mildly dilated.  ??4. The mitral valve is normal in structure. Trivial mitral valve  ?regurgitation.  ??5. The aortic valve is tricuspid. There is mild calcification of the  ?aortic valve. There is mild thickening of the aortic valve. Aortic valve  ?regurgitation is trivial. Mild aortic valve sclerosis is present, with no  ?evidence of aortic valve stenosis.  ??6. The inferior vena cava is normal in size with greater than 50%  ?respiratory variability, suggesting right atrial pressure of 3 mmHg.  ?Comparison(s): No significant change from prior study.  ? ?  ?Neuro/Psych ?Anxiety negative neurological ROS ? negative psych ROS  ? GI/Hepatic ?negative GI ROS, Neg liver ROS,   ?Endo/Other  ?negative endocrine ROS ? Renal/GU ?CRFRenal disease  ?negative genitourinary ?   ?Musculoskeletal ? ?(+) Arthritis , Osteoarthritis,   ? Abdominal ?  ?Peds ?negative pediatric ROS ?(+)  Hematology ? ?(+) Blood dyscrasia, anemia ,   ?Anesthesia Other Findings ? ? Reproductive/Obstetrics ?negative OB ROS ? ?  ? ? ? ? ? ? ? ? ? ? ? ? ? ?  ?  ? ? ? ? ? ? ? ? ?Anesthesia Physical ? ?Anesthesia Plan ? ?ASA: 4 ? ?Anesthesia Plan: MAC  ? ?Post-op Pain Management: Minimal or no pain anticipated  ? ?Induction: Intravenous ? ?PONV Risk Score and Plan: 2 and Propofol infusion and Treatment may vary due to age or medical condition ? ?Airway Management Planned: Simple Face Mask, Nasal Cannula, Natural Airway and Mask ? ?Additional Equipment: None ? ?Intra-op Plan:  ? ?Post-operative Plan:  ? ?Informed Consent: I have reviewed the patients History and Physical, chart, labs and discussed the procedure including the risks, benefits and alternatives for the proposed anesthesia with the patient or authorized representative who has indicated his/her understanding and acceptance.  ? ? ? ?Dental advisory given ? ?Plan Discussed with: CRNA and Anesthesiologist ? ?Anesthesia Plan Comments: (PAT note written 06/13/2021 by Myra Gianotti, PA-C. ? ? ? ?HPI: [name] is a 81 y.o. female with medical history significant of stage 4 adenocarcinoma of the left lung, COPD, chronic hypoxic respiratory failure, CKD3b, gout, HTN, PAF, chronic diastolic HF, recurrent GIB d/t AVMs, s/p colectomy w/ colostomy. Presenting with GIB. She reports that she has seen more blood in her ostomy bag for the last 2 weeks. During this time she had become more fatigued and intermittently short of breath. She has had occasional palpitations. She had not has  syncopal episodes. Her weakness progressed to the point where she was unable to get out of bed to the bathroom this morning. )  ? ? ? ? ? ? ?Anesthesia Quick Evaluation ? ?

## 2021-11-23 NOTE — Op Note (Addendum)
[facility] ?Patient Name: [name] ?Procedure Date: 11/23/2021 ?MRN: [id number] ?Attending MD: Mauri Pole , MD ?Date of Birth: 23-May-1941 ?CSN: [id number] ?Age: 81 ?Admit Type: Inpatient ?Procedure:                Colonoscopy ?Indications:              Evaluation of unexplained GI bleeding presenting  ?                          with Hematochezia ?Providers:                Mauri Pole, MD, Doristine Johns, RN, Clae  ?                          Psychologist, counselling, [employer] ?Referring MD:              ?Medicines:                Monitored Anesthesia Care ?Complications:            No immediate complications. ?Estimated Blood Loss:     Estimated blood loss was minimal. ?Procedure:                Pre-Anesthesia Assessment: ?                          - Prior to the procedure, a History and Physical  ?                          was performed, and patient medications and  ?                          allergies were reviewed. The patient's tolerance of  ?                          previous anesthesia was also reviewed. The risks  ?                          and benefits of the procedure and the sedation  ?                          options and risks were discussed with the patient.  ?                          All questions were answered, and informed consent  ?                          was obtained. Prior Anticoagulants: The patient has  ?                          taken no previous anticoagulant or antiplatelet  ?                          agents. ASA Grade Assessment: IV - A patient with  ?  severe systemic disease that is a constant threat  ?                          to life. After reviewing the risks and benefits,  ?                          the patient was deemed in satisfactory condition to  ?                          undergo the procedure. ?                          After obtaining informed consent, the colonoscope  ?                          was passed under  direct vision. Throughout the  ?                          procedure, the patient's blood pressure, pulse, and  ?                          oxygen saturations were monitored continuously. The  ?                          [id number]-[id number] ([id number]) Olympus endoscope was introduced  ?                          through the ascending colostomy and advanced to the  ?                          the cecum, identified by appendiceal orifice and  ?                          ileocecal valve. The colonoscopy was performed  ?                          without difficulty. The patient tolerated the  ?                          procedure well. The quality of the bowel  ?                          preparation was excellent. The ileocecal valve,  ?                          appendiceal orifice, and rectum were photographed. ?Scope In: 1:59:52 PM ?Scope Out: 2:25:29 PM ?Scope Withdrawal Time: 0 hours 7 minutes 28 seconds  ?Total Procedure Duration: 0 hours 25 minutes 37 seconds  ?Findings: ?     Three large angioectasias with bleeding were found in the cecum.  ?     Coagulation for hemostasis using argon plasma was successful. ?     Clotted blood was found in the ascending colon with active brisk oozing  ?     noted, possible diuelafoy lesion. For hemostasis, three hemostatic clips  ?  were successfully placed (MR conditional). There was no bleeding at the  ?     end of the procedure. ?Impression:               - Three bleeding colonic angioectasias. Treated  ?                          with argon plasma coagulation (APC). ?                          - Blood in the ascending colon, active bleeding  ?                          Dieulofoy's lesion. Clips (MR conditional) were  ?                          placed. ?                          - No specimens collected. ?Moderate Sedation: ?     N/A ?Recommendation:           - Resume previous diet. ?                          - Continue present medications. ?                          - Monitor Hgb and transfuse  as needed ?                          - Avoid NSAID's ?                          - GI will sign off, available if have any questions ?Procedure Code(s):        --- Professional --- ?                          [phone], Colonoscopy through stoma; with control of  ?                          bleeding, any method ?Diagnosis Code(s):        --- Professional --- ?                          K55.21, Angiodysplasia of colon with hemorrhage ?                          K92.2, Gastrointestinal hemorrhage, unspecified ?                          K92.1, Melena (includes Hematochezia) ?CPT copyright 2019 [employer]. All rights reserved. ?The codes documented in this report are preliminary and upon coder review may  ?be revised to meet current compliance requirements. ?Mauri Pole, MD ?11/23/2021 2:42:49 PM ?This report has been signed electronically. ?Number of Addenda: 0 ?

## 2021-11-23 NOTE — Assessment & Plan Note (Signed)
Stable on home 2 L. ?

## 2021-11-23 NOTE — Interval H&P Note (Signed)
History and Physical Interval Note: ? ?11/23/2021 ?1:36 PM ? ?[name]  has presented today for surgery, with the diagnosis of gastrointestinal bleeding.  The various methods of treatment have been discussed with the patient and family. After consideration of risks, benefits and other options for treatment, the patient has consented to  Procedure(s): ?COLONOSCOPY WITH PROPOFOL (N/A) ?ESOPHAGOGASTRODUODENOSCOPY (EGD) (N/A) as a surgical intervention.  The patient's history has been reviewed, patient examined, no change in status, stable for surgery.  I have reviewed the patient's chart and labs.  Questions were answered to the patient's satisfaction.   ? ? ?[name] ? ? ?

## 2021-11-23 NOTE — Consult Note (Signed)
? ?                                                                                ?Consultation Note ?Date: 11/23/2021  ? ?Patient Name: [name]  ?DOB: 03-Mar-1941  MRN: [id number]  Age / Sex: 81 y.o., female  ?PCP: [name], MD ?Referring Physician: Mercy Riding, MD ? ?Reason for Consultation: Establishing goals of care ? ?HPI/Patient Profile: 81 y.o. female admitted on 11/22/2021   ?81 year old female with metastatic lung cancer stage IV, chronic hypoxic respiratory failure, MGUS, CKD stage III, s/p partial colectomy with end colostomy for incarcerated ventral hernia admitted with bleeding through ostomy and worsening anemia.  She has received 6 PRBC within the past 4 weeks, is transfusion dependent.  GI consultation has been done.  Palliative services consulted for ongoing goals of care discussions. ?  ?Clinical Assessment and Goals of Care: ? Palliative medicine is specialized medical care for people living with serious illness. It focuses on providing relief from the symptoms and stress of a serious illness. The goal is to improve quality of life for both the patient and the family. ?Goals of care: Broad aims of medical therapy in relation to the patient's values and preferences. Our aim is to provide medical care aimed at enabling patients to achieve the goals that matter most to them, given the circumstances of their particular medical situation and their constraints.  ? ?  Patient is resting in bed, she is awaiting her procedure. She has outpatient palliative through [facility]. Notes reviewed.  ? ?NEXT OF KIN ?Lives at home with spouse and oldest daughter.  She used to be a caregiver for both of them, spouse has advanced dementia and oldest daughter has had a stroke.  She states she is no longer able to care for them anymore because of her own declining health. ? ?SUMMARY OF RECOMMENDATIONS   ?Partial code as per her wishes. ?She has [facility] palliative services at home, she wishes to  continue with them for now. ?Continue current mode of care and hospital course and monitor ?Thank you for the consult.  ? ?Code Status/Advance Care Planning: ?Limited code ? ? ?Symptom Management:  ?  ? ?Palliative Prophylaxis:  ?Delirium Protocol ? ?Psycho-social/Spiritual:  ?Desire for further Chaplaincy support:yes ?Additional Recommendations: Education on Hospice ? ?Prognosis:  ?Unable to determine ? ?Discharge Planning: Home with Palliative Services  ? ?  ? ?Primary Diagnoses: ?Present on Admission: ? GIB (gastrointestinal bleeding) ? HTN (hypertension) ? Acute renal failure superimposed on stage 3b chronic kidney disease ([place]) ? Gout ? CHF (congestive heart failure), NYHA class III, chronic, diastolic (HCC) ? Chronic venous stasis dermatitis of both lower extremities ? Paroxysmal atrial fibrillation (HCC) ? Chronic respiratory failure with hypoxia (HCC) ? COPD (chronic obstructive pulmonary disease) ([place]) ? Adenocarcinoma of left lung, stage 4 ([place]) ? Irreducible ventral hernia ? Symptomatic acute on chronic blood loss anemia due to GI bleeding patient with history of AVM ? Obesity (BMI 30-39.9) ? MGUS (monoclonal gammopathy of unknown significance) ? ? ?I have reviewed the medical record, interviewed the patient and family, and examined the patient. The following aspects are pertinent. ? ?Past Medical History:  ?  Diagnosis Date  ? Allergy   ? Angiodysplasia of cecum   ? Angiodysplasia of duodenum   ? Arthritis   ? Asteatotic eczema 02/25/2016  ? Asthma   ? Atrial fibrillation ([place]) 10/17/2017  ? Blood transfusion without reported diagnosis   ? Cancer [facility])   ? Cataract   ? CHF (congestive heart failure), NYHA class III, chronic, combined ([place]) 08/10/2017  ? Colonic polyp 02/16/2008  ? Tubular adenoma  ? COPD (chronic obstructive pulmonary disease) ([place]) 01/27/1998  ? Cor pulmonale ([place]) 11/16/2014  ? GERD 07/30/1992  ? Gout   ? HTN (hypertension) 07/30/1988  ? Hyperlipidemia with target LDL less than 100  11/27/1996  ?     ? Kidney stone 1960, 1972, 1991  ? Medical history non-contributory   ? MGUS (monoclonal gammopathy of unknown significance) 11/08/2016  ? Morbid obesity ([place]) 04/19/2010  ? She agrees to work on her lifestyle modifications to help her lose weight.   ? OSA (obstructive sleep apnea) 06/22/2013  ? Osteopenia, senile 02/05/2013  ? July 2014  -2.1 left femur -1.9 left forearm   ? Prediabetes 11/13/2006  ? Renal insufficiency   ? Stenosis of cervical spine with myelopathy ([place]) 01/01/2018  ? Supplemental oxygen dependent   ? 2L via [place]  ? Ulcer of leg, chronic, right ([place]) 10/10/2011  ? ULCERATIVE COLITIS-LEFT SIDE 03/19/2008  ?     ? Vitamin B12 deficiency anemia 11/13/2006  ?     ? Vitamin D deficiency 02/06/2013  ? ?Social History  ? ?Socioeconomic History  ? Marital status: Married  ?  Spouse name: Not on file  ? Number of children: 3  ? Years of education: Not on file  ? Highest education level: Not on file  ?Occupational History  ? Occupation: Scientist, water quality  ?  Employer: [employer]  ?Tobacco Use  ? Smoking status: Former  ?  Types: Cigarettes  ?  Quit date: 06/01/1996  ?  Years since quitting: 25.4  ? Smokeless tobacco: Never  ? Tobacco comments:  ?  Quit 1998  ?Vaping Use  ? Vaping Use: Never used  ?Substance and Sexual Activity  ? Alcohol use: No  ? Drug use: No  ? Sexual activity: Not Currently  ?  Birth control/protection: Post-menopausal  ?Other Topics Concern  ? Not on file  ?Social History Narrative  ? Daily Caffeine Use:  4  ? Regular Exercise -  NO  ?   ? ?Social Determinants of Health  ? ?Financial Resource Strain: Not on file  ?Food Insecurity: Not on file  ?Transportation Needs: Not on file  ?Physical Activity: Not on file  ?Stress: Not on file  ?Social Connections: Not on file  ? ?Family History  ?Problem Relation Age of Onset  ? Stroke Mother   ? Diabetes Mother   ? Kidney cancer Mother   ? Stomach cancer Mother   ? Heart disease Mother   ? Stroke Father   ? Heart disease Brother   ? Heart  disease Brother   ? Crohn's disease Brother   ? Heart disease Sister   ?     CATH,STENT  ? Clotting disorder Sister   ? Cervical cancer Sister   ? Lung cancer Sister   ? Heart attack Sister   ? Diabetes Sister   ? Colon cancer Neg Hx   ? Esophageal cancer Neg Hx   ? Rectal cancer Neg Hx   ? ?Scheduled Meds: ? chlorhexidine  15 mL Mouth Rinse BID  ? Chlorhexidine  Gluconate Cloth  6 each Topical Daily  ? fluticasone furoate-vilanterol  1 puff Inhalation Daily  ? mouth rinse  15 mL Mouth Rinse q12n4p  ? [START ON 11/26/2021] pantoprazole  40 mg Intravenous Q12H  ? ?Continuous Infusions: ? sodium chloride    ? pantoprazole 8 mg/hr (11/23/21 1217)  ? ?PRN Meds:.acetaminophen **OR** acetaminophen, alum & mag hydroxide-simeth, guaiFENesin, levalbuterol **AND** ipratropium, prochlorperazine ?Medications Prior to Admission:  ?Prior to Admission medications   ?Medication Sig Start Date End Date Taking? Authorizing Provider  ?acetaminophen (TYLENOL) 325 MG tablet Take 325-650 mg by mouth daily as needed for moderate pain, fever or headache.    Yes [provider]  ?albuterol (PROAIR HFA) 108 (90 Base) MCG/ACT inhaler Inhale 1-2 puffs into the lungs every 6 (six) hours as needed for wheezing or shortness of breath. 10/30/16  Yes [name], MD  ?allopurinol (ZYLOPRIM) 100 MG tablet TAKE 1 TABLET(100 MG) BY MOUTH DAILY ?Patient taking differently: Take 100 mg by mouth daily. 07/02/21  Yes [name], MD  ?clonazePAM (KLONOPIN) 1 MG tablet Take 1 tablet (1 mg total) by mouth 2 (two) times daily as needed for anxiety. ?Patient taking differently: Take 0.5-1 mg by mouth 2 (two) times daily as needed for anxiety. 06/06/21  Yes [name], MD  ?ferrous sulfate 325 (65 FE) MG tablet Take 1 tablet (325 mg total) by mouth 2 (two) times daily with a meal. 11/13/19  Yes [name], MD  ?fluticasone furoate-vilanterol (BREO ELLIPTA) 100-25 MCG/ACT AEPB Inhale 1 puff into the lungs daily. 09/19/21  Yes [name],  MD  ?isosorbide-hydrALAZINE (BIDIL) 20-37.5 MG tablet TAKE 1 TABLET BY MOUTH THREE TIMES DAILY ?Patient taking differently: Take 1 tablet by mouth in the morning and at bedtime. 05/25/21  Yes [name], MD

## 2021-11-23 NOTE — Assessment & Plan Note (Addendum)
On metoprolol for rate control.  Not on anticoagulation due to GI bleed ?-Continue home metoprolol. ?

## 2021-11-23 NOTE — Hospital Course (Addendum)
80 year old F with PMH of stage IV NSCLC with wide spread metastasis on Tagrisso, recurrent GI bleed due to small and large bowel AVMs, PAF not on AC due to GIB, partial colectomy and colostomy due to incarcerated abdominal hernia, CKD-3B, MGUS, ulcerative colitis, COPD, diastolic CHF, OSA,, chronic hypoxic RF on 2 L and recurrent malignant effusion with Pleurx returning with bleeding into ostomy bag for about 2 weeks.  Hgb 5.1 (from 8.1 on 4/17).  She was symptomatic with progressive fatigue and shortness of breath.  She was transfused 4 units.  CT abdomen and pelvis with contrast without extravasation but ventral abdominal wall hernia containing nonobstructed loops of large and small bowel, dense left LLL atelectasis and effusion unchanged from prior.  General surgery and GI consulted ? ?Patient underwent EGD and colonoscopy on 4/27.  EGD without significant finding.  Colonoscopy with 3 bleeding colonic angiectasias that were treated with APC, and actively bleeding Dieulofoy's lesion in ascending colon that was clipped with MR conditional clips.  Hgb leveled off between 7 and 8.  No further bleeding.  She was transfused additional 1 unit with appropriate response.  Hgb improved to 8.6.  Tolerated soft diet.  GI signed off. ? ?Home health PT/OT ordered as recommended by therapy.  Also added home health RN and aide.  ?

## 2021-11-23 NOTE — Transfer of Care (Signed)
Immediate Anesthesia Transfer of Care Note ? ?Patient: YAIZA PALAZZOLA ? ?Procedure(s) Performed: COLONOSCOPY WITH PROPOFOL ?ESOPHAGOGASTRODUODENOSCOPY (EGD) ?HEMOSTASIS CLIP PLACEMENT ?HOT HEMOSTASIS (ARGON PLASMA COAGULATION/BICAP) ? ?Patient Location: PACU ? ?Anesthesia Type:MAC ? ?Level of Consciousness: sedated, patient cooperative and responds to stimulation ? ?Airway & Oxygen Therapy: Patient Spontanous Breathing and Patient connected to face mask oxygen ? ?Post-op Assessment: Report given to RN and Post -op Vital signs reviewed and stable ? ?Post vital signs: Reviewed and stable ? ?Last Vitals:  ?Vitals Value Taken Time  ?BP    ?Temp    ?Pulse 68 11/23/21 1432  ?Resp 18 11/23/21 1432  ?SpO2 100 % 11/23/21 1432  ?Vitals shown include unvalidated device data. ? ?Last Pain:  ?Vitals:  ? 11/23/21 1314  ?TempSrc: Temporal  ?PainSc:   ?   ? ?Patients Stated Pain Goal: 3 (11/23/21 0000) ? ?Complications: No notable events documented. ?

## 2021-11-23 NOTE — Assessment & Plan Note (Addendum)
Currently partial code.  Not interested in intubation or mechanical ventilation but CPR, NIPPV, DCCV and ACLS meds.  I do not think those measures are to her best interest.  She has multiple end-stage comorbidities as above.  ?

## 2021-11-23 NOTE — Assessment & Plan Note (Addendum)
-   Continue home meds °

## 2021-11-23 NOTE — Assessment & Plan Note (Addendum)
Recent Labs  ?  10/09/21 ?1224 10/10/21 ?0036 10/11/21 ?0754 11/09/21 ?1003 11/13/21 ?1035 11/22/21 ?1110 11/23/21 ?0026 11/24/21 ?0422 11/25/21 ?5247 11/26/21 ?0319  ?BUN 74* 72* 52* 69* 58* 63* 43* 34* 32* 34*  ?CREATININE 1.98* 2.15* 1.51* 2.10* 1.74* 2.18* 1.80* 1.72* 1.68* 1.64*  ?Resolved. ?-Continue monitoring ?-Recheck renal function in 1 to 2 weeks ? ?

## 2021-11-23 NOTE — Assessment & Plan Note (Addendum)
Resume home allopurinol ?

## 2021-11-23 NOTE — Consult Note (Addendum)
WOC Nurse Consult Note: ?Reason for Consult:Irritant contact dermatitis (erythema intertrigo) to the inframammary and subpannicular areas and Stage 2 PI to left buttock ? ?ICD-10 CM Codes for Irritant Dermatitis ? ?L30.4  - Erythema intertrigo. Also used for abrasion of the hand, chafing of the skin, dermatitis due to sweating and friction, friction dermatitis, friction eczema, and genital/thigh intertrigo.  ? ?Wound type:irritant contact dermatitis, moisture due to perspiration, injury due to friction ?Pressure Injury POA: Yes. Stage 2 to left buttock ?Measurement:To be entered into Nursing Flow sheet by Bedside RN   ?Addendum:  measurements are 15cm x 6cm x 0.1cm  ?Wound EFE:OFHQ, moist with jagged edge (shear) ?Drainage (amount, consistency, odor) scant serous ?Periwound: mild erythema ?Dressing procedure/placement/frequency: I have provided Nursing with guidance for cleansing and covering if the lesion using an antimicrobial nonadherent gauze (xeroform) topped with dry gauze and covered with a silicone foam.The patient is on a mattress replacement with low air loss feature and is being turned and repositioned. Bilateral Prevalon Boots are provided for floatation of heels and PI prevention in this critically ill patient with multiple risk factors for PI development. ? ? [place] Nurse ostomy consult note ?Stoma type/location: LLQ  ?Stomal assessment/size: 1 and 1/2 inches viewed through pouch ?Peristomal assessment: Intact, clear ?Treatment options for stomal/peristomal skin: None ?Output: Blood in stool. (Also clots) ?Ostomy pouching: 1pc.ostomy pouching system  Kellie Simmering # [id number])  with skin barrier ring Kellie Simmering # [phone]) as been used with success until today. Increase bleeding from stoma and required the use of a 1-piece fecal pouch Kellie Simmering [phone]) attached to a bedside drainage bag. ?Education provided: None ?Enrolled patient in [place] D/C program: Yes, previously (07/2020) ? ? ?[place] nursing team will not  follow, but will remain available to this patient, the nursing and medical teams.  Please re-consult if needed. ?Thanks, ?Maudie Flakes, MSN, RN, [place], [place], CWON-AP, [place]  ?Pager# [phone]  ?

## 2021-11-23 NOTE — Progress Notes (Signed)
?PROGRESS NOTE ? ?[name] RKY:[id number] DOB: 06-23-1941  ? ?PCP: [name], MD ? ?Patient is from: Home ? ?DOA: 11/22/2021 LOS: 1 ? ?Chief complaints ?No chief complaint on file. ?  ? ?Brief Narrative / Interim history: ?81 year old F with PMH of stage IV NSCLC with wide spread metastasis on Tagrisso, recurrent GI bleed due to small and large bowel AVMs, PAF not on AC due to GIB, partial colectomy and colostomy due to incarcerated abdominal hernia, CKD-3B, MGUS, ulcerative colitis, COPD, pHTN, diastolic CHF, OSA,, chronic hypoxic RF on 2 L and recurrent malignant effusion with Pleurx returning with bleeding into ostomy bag for about 2 weeks.  Hgb 5.1 (from 8.1 on 4/17).  She was symptomatic with progressive fatigue and shortness of breath.  She was transfused 4 units.  Hgb improved to 8.9 but dropped to 7.9 about 5 hours later.  CT abdomen and pelvis with contrast without extravasation but ventral abdominal wall hernia containing nonobstructed loops of large and small bowel, dense left LLL atelectasis and effusion unchanged from prior.  General surgery and GI consulted ? ?GI planning EGD and colonoscopy on 4/27.  ? ?Subjective: ?Seen and examined earlier this morning.  No major events overnight of this morning.  Bloody output from ostomy bag with bowel prep overnight.  Hgb improved to 8.9 after 4 units but dropped down to 7.9.  Reports improvement in her breathing but complains of abdominal pain with cough.  She also reports productive cough with yellowish phlegm.  She denies hemoptysis.  She denies chest pain or dizziness lying down in bed.  Patient's sister at bedside. ? ?Objective: ?Vitals:  ? 11/23/21 0600 11/23/21 0700 11/23/21 0800 11/23/21 0830  ?BP: (!) 117/24 (!) 131/30 (!) 130/52   ?Pulse: (!) 57 (!) 58 60   ?Resp: 17 17 17    ?Temp:    98.1 ?F (36.7 ?C)  ?TempSrc:    Oral  ?SpO2: 100% 100% 100%   ?Weight:      ?Height:      ? ? ?Examination: ? ?GENERAL: No apparent distress.   Nontoxic. ?HEENT: MMM.  Vision and hearing grossly intact.  ?NECK: Supple.  No apparent JVD.  ?RESP:  No IWOB.  Fair aeration bilaterally. ?CVS:  RRR. Heart sounds normal.  ?ABD/GI/GU: BS+. Abd soft.  Ostomy output and dark red.  No obvious clot. ?MSK/EXT:  Moves extremities. No apparent deformity. No edema.  ?SKIN: no apparent skin lesion or wound ?NEURO: Awake, alert and oriented appropriately.  No apparent focal neuro deficit. ?PSYCH: Calm. Normal affect.  ? ?Procedures:  ?None ? ?Microbiology summarized: ?COVID-19 and influenza PCR nonreactive. ?MRSA PCR screen nonreactive. ? ?Assessment and Plan: ?* Symptomatic acute on chronic blood loss anemia due to GI bleeding patient with history of AVM ?Recent Labs  ?  10/09/21 ?1224 10/10/21 ?0036 10/10/21 ?2049 10/11/21 ?0754 10/30/21 ?1110 11/09/21 ?1003 11/13/21 ?1035 11/22/21 ?1110 11/23/21 ?0019 11/23/21 ?3151  ?HGB 6.5* 7.1* 8.6* 9.4* 7.3* 6.0* 8.1* 5.1* 8.9* 7.9*  ?Patient seems to be transfusion dependent.  Multiple blood transfusion in the last 1 to 2 months.  Hgb improved from 5.1-8.9 after 4 units but dropped to 7.9 about 6 hours later.  Dark bloody output from colostomy after bowel prep.   ?-Continue IV PPI ?-Monitor H&H every 8 hours ?-Plan for EGD and colonoscopy today ? ? ?Goals of care, counseling/discussion ?Currently partial code.  Not interested in intubation or mechanical ventilation but CPR, NIPPV, DCCV and ACLS meds.  I do not think those  measures are to her best interest.  She has multiple end-stage comorbidities as above.  ?-Palliative care consulted on admission. ? ?Adenocarcinoma of left lung, stage 4 ([place]) ?Followed by Dr. Julien Nordmann.  On Tagrisso ?-Oncology added to treatment team. ?-Tagrisso on hold ? ?Irreducible ventral hernia ?Per surgery, no indication for surgical intervention.  Patient not interested either. ?-Defer to surgery ? ?Acute renal failure superimposed on stage 3b chronic kidney disease ([place]) ?Recent Labs  ?  08/14/21 ?1500  09/12/21 ?1530 10/09/21 ?0859 10/09/21 ?1224 10/10/21 ?0036 10/11/21 ?0754 11/09/21 ?1003 11/13/21 ?1035 11/22/21 ?1110 11/23/21 ?0026  ?BUN 63* 79* 76* 74* 72* 52* 69* 58* 63* 43*  ?CREATININE 1.67* 1.70* 1.98* 1.98* 2.15* 1.51* 2.10* 1.74* 2.18* 1.80*  ?Resolving. ?-Continue monitoring ?-Avoid nephrotoxic meds ?-Hold home diuretics ? ? ?COPD (chronic obstructive pulmonary disease) ([place]) ?Resume home inhalers ?-Xopenex and Atrovent as needed ? ?Chronic respiratory failure with hypoxia (HCC) ?Stable on home 2 L. ? ?Paroxysmal atrial fibrillation ([place]) ?On metoprolol for rate control.  Not on anticoagulation due to GI bleed ?-Resume home metoprolol after EGD/colonoscopy. ? ?CHF (congestive heart failure), NYHA class III, chronic, diastolic (HCC) ?Appears euvolemic.  TTE in 04/2020 with LVEF of 60 to 65%, G2 DD and normal RVSP.  On torsemide at home. ?-Continue holding torsemide in the setting of GI bleed and AKI ?-Monitor fluid and respiratory status ? ?Obesity (BMI 30-39.9) ?Body mass index is 32.89 kg/m?. ? ? ?HTN (hypertension) ?Wide pulse pressure with systolic in 841L and diastolic as low as 24M and 30s.  No report of aortic insufficiency on echo in 04/2020. ?-Consider echocardiogram ?-Hold home antihypertensive meds ? ?Gout ?Resume home allopurinol once she started taking p.o. ? ? ? ? ?DVT prophylaxis:  ?SCDs Start: 11/22/21 1611 ? ?Code Status: Partial code.  No intubation or mechanical ventilation but CPR, DCCV, NIPPV, ACLS meds ?Family Communication: Updated patient's sister at bedside. ?Level of care: Stepdown ?Status is: Inpatient ?Remains inpatient appropriate because: Symptomatic acute on chronic blood loss anemia in the setting of GI bleed ? ?Final disposition: TBD ?Consultants:  ?General surgery ?Gastroenterology ? ?Sch Meds:  ?Scheduled Meds: ? chlorhexidine  15 mL Mouth Rinse BID  ? Chlorhexidine Gluconate Cloth  6 each Topical Daily  ? mouth rinse  15 mL Mouth Rinse q12n4p  ? [START ON 11/26/2021]  pantoprazole  40 mg Intravenous Q12H  ? ?Continuous Infusions: ? sodium chloride    ? pantoprazole 8 mg/hr (11/23/21 1217)  ? ?PRN Meds:.alum & mag hydroxide-simeth, guaiFENesin, prochlorperazine ? ?Antimicrobials: ?Anti-infectives (From admission, onward)  ? ? None  ? ?  ? ? ? ?I have personally reviewed the following labs and images: ?CBC: ?Recent Labs  ?Lab 11/22/21 ?1110 11/23/21 ?0019 11/23/21 ?0102  ?WBC 7.1 8.6  --   ?NEUTROABS  --  6.5  --   ?HGB 5.1* 8.9* 7.9*  ?HCT 16.3* 27.4* 24.8*  ?MCV 93.1 89.5  --   ?PLT 137* 106*  --   ? ?BMP &GFR ?Recent Labs  ?Lab 11/22/21 ?1110 11/23/21 ?0026  ?NA 136 137  ?K 4.6 5.0  ?CL 105 111  ?CO2 26 25  ?GLUCOSE 134* 107*  ?BUN 63* 43*  ?CREATININE 2.18* 1.80*  ?CALCIUM 9.0 8.5*  ? ?Estimated Creatinine Clearance: 22.4 mL/min (A) (by C-G formula based on SCr of 1.8 mg/dL (H)). ?Liver & Pancreas: ?Recent Labs  ?Lab 11/22/21 ?1110 11/23/21 ?0026  ?AST 17 19  ?ALT 7 6  ?ALKPHOS 40 35*  ?BILITOT 0.3 0.9  ?PROT 6.0* 5.5*  ?ALBUMIN  2.9* 2.6*  ? ?No results for input(s): LIPASE, AMYLASE in the last 168 hours. ?No results for input(s): AMMONIA in the last 168 hours. ?Diabetic: ?No results for input(s): HGBA1C in the last 72 hours. ?No results for input(s): GLUCAP in the last 168 hours. ?Cardiac Enzymes: ?No results for input(s): CKTOTAL, CKMB, CKMBINDEX, TROPONINI in the last 168 hours. ?No results for input(s): PROBNP in the last 8760 hours. ?Coagulation Profile: ?Recent Labs  ?Lab 11/23/21 ?0026  ?INR 1.1  ? ?Thyroid Function Tests: ?No results for input(s): TSH, T4TOTAL, FREET4, T3FREE, THYROIDAB in the last 72 hours. ?Lipid Profile: ?No results for input(s): CHOL, HDL, LDLCALC, TRIG, CHOLHDL, LDLDIRECT in the last 72 hours. ?Anemia Panel: ?No results for input(s): VITAMINB12, FOLATE, FERRITIN, TIBC, IRON, RETICCTPCT in the last 72 hours. ?Urine analysis: ?   ?Component Value Date/Time  ? COLORURINE YELLOW 09/28/2020 1550  ? APPEARANCEUR CLEAR 09/28/2020 1550  ? LABSPEC 1.010  09/28/2020 1550  ? PHURINE 6.0 09/28/2020 1550  ? GLUCOSEU NEGATIVE 09/28/2020 1550  ? HGBUR NEGATIVE 09/28/2020 1550  ? HGBUR moderate 02/02/2008 1439  ? BILIRUBINUR Negative 06/27/2021 1533  ? KETONESUR NEGATIVE 09/28/2020 1

## 2021-11-23 NOTE — Op Note (Signed)
[facility] ?Patient Name: [name] ?Procedure Date: 11/23/2021 ?MRN: [id number] ?Attending MD: Mauri Pole , MD ?Date of Birth: 1941-03-16 ?CSN: [id number] ?Age: 81 ?Admit Type: Inpatient ?Procedure:                Upper GI endoscopy ?Indications:              Active gastrointestinal bleeding ?Providers:                Mauri Pole, MD, Doristine Johns, RN, Clae  ?                          Psychologist, counselling, Merchant navy officer ?Referring MD:              ?Medicines:                Monitored Anesthesia Care ?Complications:            No immediate complications. ?Estimated Blood Loss:     Estimated blood loss was minimal. ?Procedure:                Pre-Anesthesia Assessment: ?                          - Prior to the procedure, a History and Physical  ?                          was performed, and patient medications and  ?                          allergies were reviewed. The patient's tolerance of  ?                          previous anesthesia was also reviewed. The risks  ?                          and benefits of the procedure and the sedation  ?                          options and risks were discussed with the patient.  ?                          All questions were answered, and informed consent  ?                          was obtained. Prior Anticoagulants: The patient has  ?                          taken no previous anticoagulant or antiplatelet  ?                          agents. ASA Grade Assessment: IV - A patient with  ?                          severe systemic disease that is a constant threat  ?  to life. After reviewing the risks and benefits,  ?                          the patient was deemed in satisfactory condition to  ?                          undergo the procedure. ?                          After obtaining informed consent, the endoscope was  ?                          passed under direct vision. Throughout the  ?                           procedure, the patient's blood pressure, pulse, and  ?                          oxygen saturations were monitored continuously. The  ?                          [id number]-[id number] ([id number] Olympus endoscope was introduced  ?                          through the mouth, and advanced to the second part  ?                          of duodenum. The upper GI endoscopy was  ?                          accomplished without difficulty. The patient  ?                          tolerated the procedure well. ?Scope In: ?Scope Out: ?Findings: ?     The Z-line was regular and was found 35 cm from the incisors. ?     No gross lesions were noted in the entire esophagus. ?     A small hiatal hernia was present. ?     The cardia and gastric fundus were normal on retroflexion. ?     The examined duodenum was normal. ?Impression:               - Z-line regular, 35 cm from the incisors. ?                          - No gross lesions in esophagus. ?                          - Small hiatal hernia. ?                          - Normal examined duodenum. ?                          - No specimens collected. ?Moderate Sedation: ?     N/A ?Recommendation:           -  Patient has a contact number available for  ?                          emergencies. The signs and symptoms of potential  ?                          delayed complications were discussed with the  ?                          patient. Return to normal activities tomorrow.  ?                          Written discharge instructions were provided to the  ?                          patient. ?                          - Resume previous diet. ?                          - Continue present medications. ?                          - See the other procedure note for documentation of  ?                          additional recommendations. ?Procedure Code(s):        --- Professional --- ?                          [phone], Esophagogastroduodenoscopy, flexible,  ?                          transoral; diagnostic,  including collection of  ?                          specimen(s) by brushing or washing, when performed  ?                          (separate procedure) ?Diagnosis Code(s):        --- Professional --- ?                          K44.9, Diaphragmatic hernia without obstruction or  ?                          gangrene ?                          K92.2, Gastrointestinal hemorrhage, unspecified ?CPT copyright 2019 [employer]. All rights reserved. ?The codes documented in this report are preliminary and upon coder review may  ?be revised to meet current compliance requirements. ?Mauri Pole, MD ?11/23/2021 2:44:40 PM ?This report has been signed electronically. ?Number of Addenda: 0 ?

## 2021-11-23 NOTE — Assessment & Plan Note (Addendum)
She has wide pulse pressure with low DBP.  TTE as above.  ?Continue home medications. ?

## 2021-11-23 NOTE — Anesthesia Postprocedure Evaluation (Signed)
Anesthesia Post Note ? ?Patient: [name] ? ?Procedure(s) Performed: COLONOSCOPY WITH PROPOFOL ?ESOPHAGOGASTRODUODENOSCOPY (EGD) ?HEMOSTASIS CLIP PLACEMENT ?HOT HEMOSTASIS (ARGON PLASMA COAGULATION/BICAP) ? ?  ? ?Patient location during evaluation: PACU ?Anesthesia Type: MAC ?Level of consciousness: awake and alert ?Pain management: pain level controlled ?Vital Signs Assessment: post-procedure vital signs reviewed and stable ?Respiratory status: spontaneous breathing, nonlabored ventilation, respiratory function stable and patient connected to nasal cannula oxygen ?Cardiovascular status: stable and blood pressure returned to baseline ?Postop Assessment: no apparent nausea or vomiting ?Anesthetic complications: no ? ? ?No notable events documented. ? ?Last Vitals:  ?Vitals:  ? 11/23/21 1432 11/23/21 1440  ?BP: (!) 153/27 (!) 107/46  ?Pulse: 68 66  ?Resp: 18 (!) 22  ?Temp: 37 ?C   ?SpO2: 100% 100%  ?  ?Last Pain:  ?Vitals:  ? 11/23/21 1314  ?TempSrc: Temporal  ?PainSc:   ? ? ?  ?  ?  ?  ?  ?  ? ?[name] ? ? ? ? ?

## 2021-11-24 ENCOUNTER — Encounter (HOSPITAL_COMMUNITY): Payer: Self-pay | Admitting: Gastroenterology

## 2021-11-24 ENCOUNTER — Inpatient Hospital Stay (HOSPITAL_COMMUNITY): Payer: Medicare Other

## 2021-11-24 DIAGNOSIS — I5032 Chronic diastolic (congestive) heart failure: Secondary | ICD-10-CM | POA: Diagnosis not present

## 2021-11-24 DIAGNOSIS — K922 Gastrointestinal hemorrhage, unspecified: Secondary | ICD-10-CM | POA: Diagnosis not present

## 2021-11-24 DIAGNOSIS — K6381 Dieulafoy lesion of intestine: Secondary | ICD-10-CM

## 2021-11-24 DIAGNOSIS — N179 Acute kidney failure, unspecified: Secondary | ICD-10-CM | POA: Diagnosis not present

## 2021-11-24 DIAGNOSIS — C3492 Malignant neoplasm of unspecified part of left bronchus or lung: Secondary | ICD-10-CM | POA: Diagnosis not present

## 2021-11-24 DIAGNOSIS — K921 Melena: Secondary | ICD-10-CM

## 2021-11-24 DIAGNOSIS — D649 Anemia, unspecified: Secondary | ICD-10-CM | POA: Diagnosis not present

## 2021-11-24 DIAGNOSIS — K5521 Angiodysplasia of colon with hemorrhage: Principal | ICD-10-CM

## 2021-11-24 LAB — CBC
HCT: 25.7 % — ABNORMAL LOW (ref 36.0–46.0)
Hemoglobin: 8.1 g/dL — ABNORMAL LOW (ref 12.0–15.0)
MCH: 29 pg (ref 26.0–34.0)
MCHC: 31.5 g/dL (ref 30.0–36.0)
MCV: 92.1 fL (ref 80.0–100.0)
Platelets: 111 10*3/uL — ABNORMAL LOW (ref 150–400)
RBC: 2.79 MIL/uL — ABNORMAL LOW (ref 3.87–5.11)
RDW: 15.3 % (ref 11.5–15.5)
WBC: 8.3 10*3/uL (ref 4.0–10.5)
nRBC: 0 % (ref 0.0–0.2)

## 2021-11-24 LAB — RENAL FUNCTION PANEL
Albumin: 2.6 g/dL — ABNORMAL LOW (ref 3.5–5.0)
Anion gap: 3 — ABNORMAL LOW (ref 5–15)
BUN: 34 mg/dL — ABNORMAL HIGH (ref 8–23)
CO2: 25 mmol/L (ref 22–32)
Calcium: 9.1 mg/dL (ref 8.9–10.3)
Chloride: 111 mmol/L (ref 98–111)
Creatinine, Ser: 1.72 mg/dL — ABNORMAL HIGH (ref 0.44–1.00)
GFR, Estimated: 30 mL/min — ABNORMAL LOW (ref >60)
Glucose, Bld: 97 mg/dL (ref 70–99)
Phosphorus: 3.8 mg/dL (ref 2.5–4.6)
Potassium: 4.9 mmol/L (ref 3.5–5.1)
Sodium: 139 mmol/L (ref 135–145)

## 2021-11-24 LAB — ECHOCARDIOGRAM COMPLETE
AR max vel: 2.23 cm2
AV Peak grad: 10.3 mmHg
Ao pk vel: 1.61 m/s
Area-P 1/2: 4.65 cm2
Height: 60 in
P 1/2 time: 533 msec
S' Lateral: 2.7 cm
Weight: 2705.49 oz

## 2021-11-24 LAB — MAGNESIUM: Magnesium: 2.6 mg/dL — ABNORMAL HIGH (ref 1.7–2.4)

## 2021-11-24 LAB — HEMOGLOBIN AND HEMATOCRIT, BLOOD
HCT: 24.2 % — ABNORMAL LOW (ref 36.0–46.0)
Hemoglobin: 7.7 g/dL — ABNORMAL LOW (ref 12.0–15.0)

## 2021-11-24 MED ORDER — METOPROLOL TARTRATE 25 MG PO TABS
12.5000 mg | ORAL_TABLET | Freq: Two times a day (BID) | ORAL | Status: DC
  Administered 2021-11-24 – 2021-11-25 (×3): 12.5 mg via ORAL
  Filled 2021-11-24 (×3): qty 1

## 2021-11-24 MED ORDER — OSIMERTINIB MESYLATE 80 MG PO TABS
80.0000 mg | ORAL_TABLET | Freq: Every day | ORAL | Status: DC
  Administered 2021-11-24 – 2021-11-25 (×2): 80 mg via ORAL

## 2021-11-24 MED ORDER — TORSEMIDE 20 MG PO TABS
20.0000 mg | ORAL_TABLET | Freq: Every day | ORAL | Status: DC
  Administered 2021-11-24 – 2021-11-26 (×3): 20 mg via ORAL
  Filled 2021-11-24 (×3): qty 1

## 2021-11-24 MED ORDER — ALLOPURINOL 100 MG PO TABS
100.0000 mg | ORAL_TABLET | Freq: Every day | ORAL | Status: DC
  Administered 2021-11-24 – 2021-11-26 (×3): 100 mg via ORAL
  Filled 2021-11-24 (×3): qty 1

## 2021-11-24 NOTE — Evaluation (Signed)
Physical Therapy Evaluation ?Patient Details ?Name: [name] ?MRN: [id number] ?DOB: 12/24/1940 ?Today's Date: 11/24/2021 ? ?History of Present Illness ? 81 year old F with PMH of stage IV NSCLC with wide spread metastasis , recurrent GI bleed due to small and large bowel AVMs, PAF not on AC due to GIB, partial colectomy and colostomy due to incarcerated abdominal hernia, CKD-3B, MGUS, ulcerative colitis, COPD, diastolic CHF, OSA,, chronic hypoxic RF on 2 L and recurrent malignant effusion with Pleurx removed 10/17/21,returning 11/23/21  with bleeding into ostomy bag for about 2 weeks.  Hgb 5.1.  She was symptomatic with progressive fatigue and shortness of breath  ?Clinical Impression ?   Patient  resides at home with spouse and daughter who are unable  to  assist. Patient's granddaughter provides most of assistance for  all of the family. Patient's sister available. Patient was ambulatory with rollator at baseline, on 2 L [place]. ? Patient limited by pain in LUE and fatigue. Patient did sit on bed edge for several minutes, declined to mobilize to recliner at this time. Patient hopefully will improve to return to ambulatory status to return home. ? Pt's BP 148/15 sup, then 146/45 sitting. ?Pt admitted with above diagnosis.  Pt currently with functional limitations due to the deficits listed below (see PT Problem List). Pt will benefit from skilled PT to increase their independence and safety with mobility to allow discharge to the venue listed below.   ? ? ?   ? ?Recommendations for follow up therapy are one component of a multi-disciplinary discharge planning process, led by the attending physician.  Recommendations may be updated based on patient status, additional functional criteria and insurance authorization. ? ?Follow Up Recommendations Home health PT, if able to return to prior ambulatory status using rollator ? ?  ?Assistance Recommended at Discharge Frequent or constant Supervision/Assistance  ?Patient can  return home with the following ? A lot of help with walking and/or transfers;A little help with bathing/dressing/bathroom;Help with stairs or ramp for entrance;Assistance with cooking/housework;Assist for transportation ? ?  ?Equipment Recommendations None recommended by PT  ?Recommendations for Other Services ?    ?  ?Functional Status Assessment Patient has had a recent decline in their functional status and demonstrates the ability to make significant improvements in function in a reasonable and predictable amount of time.  ? ?  ?Precautions / Restrictions Precautions ?Precautions: Fall ?Precaution Comments: on 4 L , colostomy  ? ?  ? ?Mobility ? Bed Mobility ?Overal bed mobility: Needs Assistance ?Bed Mobility: Supine to Sit, Sit to Supine ?  ?  ?Supine to sit: HOB elevated, Mod assist ?Sit to supine: HOB elevated, Mod assist ?  ?General bed mobility comments: extra time, assist with trunk and legs ?  ? ?Transfers ?  ?  ?  ?  ?  ?  ?  ?  ?  ?General transfer comment: pt declined, only wanted too sit bed edge ?  ? ?Ambulation/Gait ?  ?  ?  ?  ?  ?  ?  ?  ? ?Stairs ?  ?  ?  ?  ?  ? ?Wheelchair Mobility ?  ? ?Modified Rankin (Stroke Patients Only) ?  ? ?  ? ?Balance Overall balance assessment: Needs assistance ?Sitting-balance support: Feet unsupported, No upper extremity supported ?Sitting balance-Leahy Scale: Fair ?  ?  ?  ?  ?  ?  ?  ?  ?  ?  ?  ?  ?  ?  ?  ?  ?   ? ? ? ?  Pertinent Vitals/Pain Pain Assessment ?Pain Assessment: Faces ?Faces Pain Scale: Hurts even more ?Pain Location: under upper arm on Lt" started after colonsocopy 4/27" ?Pain Descriptors / Indicators: Discomfort ?Pain Intervention(s): Monitored during session, Limited activity within patient's tolerance  ? ? ?Home Living Family/patient expects to be discharged to:: Private residence ?Living Arrangements: Other relatives ?Available Help at Discharge: Family;Available 24 hours/day ?Type of Home: House ?Home Access: Stairs to enter ?  ?Entrance  Stairs-Number of Steps: 1 ?  ?Home Layout: One level ?Home Equipment: Grab bars - tub/shower;Cane - single point;BSC/3in1 ?Additional Comments: patient has been caring for  spouse with dementia and daughter  has STM loss due to stroke. grandaughter assists when abl, sister assisting alos  ?  ?Prior Function Prior Level of Function : Needs assist ?  ?  ?  ?Physical Assist : Mobility (physical) ?  ?  ?Mobility Comments: recently requiring assistance in the home, uses RW ?ADLs Comments: I self ADLs ?  ? ? ?Hand Dominance  ? Dominant Hand: Right ? ?  ?Extremity/Trunk Assessment  ?   ?  ? ?Lower Extremity Assessment ?Lower Extremity Assessment: Generalized weakness ?  ? ?Cervical / Trunk Assessment ?Cervical / Trunk Assessment: Kyphotic  ?Communication  ? Communication: No difficulties  ?Cognition Arousal/Alertness: Awake/alert ?Behavior During Therapy: [facility] for tasks assessed/performed ?Overall Cognitive Status: Within Functional Limits for tasks assessed ?  ?  ?  ?  ?  ?  ?  ?  ?  ?  ?  ?  ?  ?  ?  ?  ?  ?  ?  ? ?  ?General Comments   ? ?  ?Exercises    ? ?Assessment/Plan  ?  ?PT Assessment Patient needs continued PT services  ?PT Problem List Decreased strength;Decreased mobility;Decreased activity tolerance;Decreased knowledge of use of DME;Pain;Cardiopulmonary status limiting activity ? ?   ?  ?PT Treatment Interventions DME instruction;Therapeutic activities;Gait training;Therapeutic exercise;Patient/family education;Functional mobility training   ? ?PT Goals (Current goals can be found in the Care Plan section)  ?Acute Rehab PT Goals ?Patient Stated Goal: wants to  return home, take care of family ?PT Goal Formulation: With patient/family ?Time For Goal Achievement: 12/08/21 ?Potential to Achieve Goals: Fair ? ?  ?Frequency Min 3X/week ?  ? ? ?Co-evaluation PT/OT/SLP Co-Evaluation/Treatment: Yes ?Reason for Co-Treatment: Complexity of the patient's impairments (multi-system involvement);For patient/therapist  safety ?PT goals addressed during session: Mobility/safety with mobility ?OT goals addressed during session: ADL's and self-care ?  ? ? ?  ?AM-PAC PT "6 Clicks" Mobility  ?Outcome Measure Help needed turning from your back to your side while in a flat bed without using bedrails?: A Lot ?Help needed moving from lying on your back to sitting on the side of a flat bed without using bedrails?: A Lot ?Help needed moving to and from a bed to a chair (including a wheelchair)?: Total ?Help needed standing up from a chair using your arms (e.g., wheelchair or bedside chair)?: Total ?Help needed to walk in hospital room?: Total ?Help needed climbing 3-5 steps with a railing? : Total ?6 Click Score: 8 ? ?  ?End of Session Equipment Utilized During Treatment: Oxygen ?Activity Tolerance: Patient limited by fatigue ?Patient left: in bed;with call bell/phone within reach;with bed alarm set;with family/visitor present ?Nurse Communication: Mobility status ?PT Visit Diagnosis: Unsteadiness on feet (R26.81);Muscle weakness (generalized) (M62.81) ?  ? ?Time: 3818-2993 ?PT Time Calculation (min) (ACUTE ONLY): 22 min ? ? ?Charges:   PT Evaluation ?$PT Eval Low Complexity: 1 Low ?  ?  ?   ? ? ?  Tresa Endo PT ?Acute Rehabilitation Services ?Pager [phone] ?Office [phone] ? ? ?Tiarra Anastacio, Shella Maxim ?11/24/2021, 1:32 PM ? ?

## 2021-11-24 NOTE — Plan of Care (Signed)
?  Problem: Clinical Measurements: ?Goal: Ability to maintain clinical measurements within normal limits will improve ?Outcome: Progressing ?Goal: Will remain free from infection ?Outcome: Progressing ?Goal: Diagnostic test results will improve ?Outcome: Progressing ?Goal: Respiratory complications will improve ?Outcome: Progressing ?  ?Problem: Elimination: ?Goal: Will not experience complications related to urinary retention ?Outcome: Progressing ?  ?

## 2021-11-24 NOTE — Progress Notes (Signed)
Occupational Therapy Evaluation ? ?Patient lives at home with spouse who has dementia and her daughter that has cognitive and physical deficits 2* CVA. Patient reports difficulty caring for them, patient's granddaughter and sister have been coming over lately to assist as able. Patient is typically ambulatory with rolling walker and able to perform her self care tasks. Currently patient limited by pain in upper left arm and abdomen, decreased activity tolerance. Patient declines to get to chair this session and needing mod A for bed mobility to sit up at edge of bed ~6 minutes. If patient able to progress with mobility and self care during acute hospital stay, recommend D/C with [facility] and family support. Acute OT to follow.  ? ? ? 11/24/21 1400  ?OT Visit Information  ?Last OT Received On 11/24/21  ?Assistance Needed +2  ?PT/OT/SLP Co-Evaluation/Treatment Yes  ?Reason for Co-Treatment To address functional/ADL transfers;For patient/therapist safety  ?PT goals addressed during session Mobility/safety with mobility  ?OT goals addressed during session ADL's and self-care  ?History of Present Illness 81 year old F with PMH of stage IV NSCLC with wide spread metastasis , recurrent GI bleed due to small and large bowel AVMs, PAF not on AC due to GIB, partial colectomy and colostomy due to incarcerated abdominal hernia, CKD-3B, MGUS, ulcerative colitis, COPD, diastolic CHF, OSA,, chronic hypoxic RF on 2 L and recurrent malignant effusion with Pleurx removed 10/17/21,returning 11/23/21  with bleeding into ostomy bag for about 2 weeks.  Hgb 5.1.  She was symptomatic with progressive fatigue and shortness of breath  ?Precautions  ?Precautions Fall  ?Precaution Comments on 4 L , colostomy  ?Restrictions  ?Weight Bearing Restrictions No  ?Home Living  ?Family/patient expects to be discharged to: Private residence  ?Living Arrangements Other relatives  ?Available Help at Discharge Family  ?Type of Home House  ?Home Access Stairs to  enter  ?Entrance Stairs-Number of Steps 1  ?Entrance Stairs-Rails None  ?Home Layout One level  ?Bathroom Shower/Tub Tub/shower unit  ?Bathroom Toilet Standard  ?Home Equipment Grab bars - tub/shower;Cane - single point;BSC/3in1  ?Additional Comments patient has been caring for  spouse with dementia and daughter  has STM loss due to stroke. grandaughter assists when able, sister assisting also  ?Prior Function  ?Prior Level of Function  Needs assist  ?Mobility Comments recently requiring assistance in the home, uses RW  ?ADLs Comments Able to perform ADL tasks  ?Communication  ?Communication No difficulties  ?Pain Assessment  ?Pain Assessment Faces  ?Faces Pain Scale 6  ?Pain Location under upper arm on Lt" started after colonsocopy 4/27" and abdomen  ?Pain Descriptors / Indicators Discomfort  ?Pain Intervention(s) Monitored during session  ?Cognition  ?Arousal/Alertness Awake/alert  ?Behavior During Therapy [facility] for tasks assessed/performed  ?Overall Cognitive Status Within Functional Limits for tasks assessed  ?Upper Extremity Assessment  ?Upper Extremity Assessment RUE deficits/detail;LUE deficits/detail  ?RUE Deficits / Details Limited bilateral shoulder AROM to ~90 degrees. global weakness throughout UEs 3/5  ?Lower Extremity Assessment  ?Lower Extremity Assessment Defer to PT evaluation  ?Cervical / Trunk Assessment  ?Cervical / Trunk Assessment Kyphotic  ?ADL  ?Overall ADL's  Needs assistance/impaired  ?Eating/Feeding Independent;Sitting;Bed level  ?Grooming Min guard;Sitting  ?Upper Body Bathing Min guard;Sitting  ?Lower Body Bathing Maximal assistance;Sitting/lateral leans  ?Upper Body Dressing  Min guard;Sitting  ?Lower Body Dressing Total assistance;Sitting/lateral leans  ?Toilet Transfer Details (indicate cue type and reason) Deferred, patient reports too fatigued and abdominal discomfort to get to chair  ?Toileting- Forensic psychologist Total  assistance;Bed level  ?Toileting - Clothing  Manipulation Details (indicate cue type and reason) purewick, colostomy  ?General ADL Comments Patient needing increased assistance with self care tasks due to limited activity tolerance, balance, safety  ?Bed Mobility  ?Overal bed mobility Needs Assistance  ?Bed Mobility Supine to Sit;Sit to Supine  ?Supine to sit HOB elevated;Mod assist  ?Sit to supine HOB elevated;Mod assist  ?General bed mobility comments extra time, assist with trunk and legs  ?Balance  ?Overall balance assessment Needs assistance  ?Sitting-balance support Feet unsupported;Single extremity supported  ?Sitting balance-Leahy Scale Poor  ?OT - End of Session  ?Equipment Utilized During Treatment Oxygen  ?Activity Tolerance Patient limited by fatigue  ?Patient left in bed;with call bell/phone within reach;with family/visitor present  ?Nurse Communication Mobility status  ?OT Assessment  ?OT Recommendation/Assessment Patient needs continued OT Services  ?OT Visit Diagnosis Other abnormalities of gait and mobility (R26.89);Muscle weakness (generalized) (M62.81)  ?OT Problem List Decreased strength;Decreased activity tolerance;Decreased range of motion;Impaired balance (sitting and/or standing);Decreased safety awareness;Pain  ?OT Plan  ?OT Frequency (ACUTE ONLY) Min 2X/week  ?OT Treatment/Interventions (ACUTE ONLY) Self-care/ADL training;Therapeutic exercise;DME and/or AE instruction;Energy conservation;Therapeutic activities;Patient/family education;Balance training  ?AM-PAC OT "6 Clicks" Daily Activity Outcome Measure (Version 2)  ?Help from another person eating meals? 4  ?Help from another person taking care of personal grooming? 3  ?Help from another person toileting, which includes using toliet, bedpan, or urinal? 1  ?Help from another person bathing (including washing, rinsing, drying)? 2  ?Help from another person to put on and taking off regular upper body clothing? 3  ?Help from another person to put on and taking off regular lower body  clothing? 1  ?6 Click Score 14  ?Progressive Mobility  ?What is the highest level of mobility based on the progressive mobility assessment? Level 2 (Chairfast) - Balance while sitting on edge of bed and cannot stand  ?Activity Dangled on edge of bed  ?OT Recommendation  ?Follow Up Recommendations Home health OT ?(if patient has family support at baseline and progresses with mobility)  ?Assistance recommended at discharge Frequent or constant Supervision/Assistance  ?Patient can return home with the following A lot of help with walking and/or transfers;A lot of help with bathing/dressing/bathroom;Assistance with cooking/housework;Help with stairs or ramp for entrance;Assist for transportation  ?Functional Status Assessent Patient has had a recent decline in their functional status and demonstrates the ability to make significant improvements in function in a reasonable and predictable amount of time.  ?OT Equipment None recommended by OT  ?Individuals Consulted  ?Consulted and Agree with Results and Recommendations Patient  ?Acute Rehab OT Goals  ?Patient Stated Goal Home with family support  ?OT Goal Formulation With patient/family  ?Time For Goal Achievement 12/08/21  ?Potential to [place]  ?OT Time Calculation  ?OT Start Time (ACUTE ONLY) 1221  ?OT Stop Time (ACUTE ONLY) 1238  ?OT Time Calculation (min) 17 min  ?OT General Charges  ?$OT Visit 1 Visit  ?OT Evaluation  ?$OT Eval Low Complexity 1 Low  ?Written Expression  ?Dominant Hand Right  ? ?Delbert Phenix OT ?OT pager: [phone] ? ?

## 2021-11-24 NOTE — Progress Notes (Signed)
Patient's home supplied [place] delivered to [place] with Pharmacy team. Count verified by Azzie Almas, RN. See paper chart for receipt and count.  ?

## 2021-11-24 NOTE — Progress Notes (Signed)
?PROGRESS NOTE ? ?[name] WRU:[id number] DOB: 03/23/1941  ? ?PCP: [name], MD ? ?Patient is from: Home ? ?DOA: 11/22/2021 LOS: 2 ? ?Chief complaints ?No chief complaint on file. ?  ? ?Brief Narrative / Interim history: ?81 year old F with PMH of stage IV NSCLC with wide spread metastasis on Tagrisso, recurrent GI bleed due to small and large bowel AVMs, PAF not on AC due to GIB, partial colectomy and colostomy due to incarcerated abdominal hernia, CKD-3B, MGUS, ulcerative colitis, COPD, diastolic CHF, OSA,, chronic hypoxic RF on 2 L and recurrent malignant effusion with Pleurx returning with bleeding into ostomy bag for about 2 weeks.  Hgb 5.1 (from 8.1 on 4/17).  She was symptomatic with progressive fatigue and shortness of breath.  She was transfused 4 units.  CT abdomen and pelvis with contrast without extravasation but ventral abdominal wall hernia containing nonobstructed loops of large and small bowel, dense left LLL atelectasis and effusion unchanged from prior.  General surgery and GI consulted ? ?Patient underwent EGD and colonoscopy on 4/27.  EGD without significant finding.  Colonoscopy with 3 bleeding colonic angiectasias that were treated with APC, and actively bleeding Dieulofoy's lesion in ascending colon that was clipped with MR conditional clips.  Hgb leveled off at 8.0.  No further bleeding.  GI signed off. ? ?TTE and PT/OT evaluation.  Advanced diet to soft  ? ?Subjective: ?Seen and examined earlier this morning.  No major events overnight of this morning.  She reports abdominal pain with cough, and intermittent shortness of breath.  No further bleeding into ostomy bag.  Hemoglobin seems to be stable.  ? ?Objective: ?Vitals:  ? 11/24/21 1000 11/24/21 1100 11/24/21 1125 11/24/21 1200  ?BP: (!) 118/36 (!) 130/31  (!) 148/15  ?Pulse: 66 65  80  ?Resp: 20 20  20   ?Temp:   98.4 ?F (36.9 ?C)   ?TempSrc:   Oral   ?SpO2: 100% 100%  100%  ?Weight:      ?Height:       ? ? ?Examination ? ?GENERAL: No apparent distress.  Nontoxic. ?HEENT: MMM.  Vision and hearing grossly intact.  ?NECK: Supple.  No apparent JVD.  ?RESP: 100% on 2 L.  No IWOB.  Fair aeration bilaterally. ?CVS:  RRR. Heart sounds normal.  ?ABD/GI/GU: BS+. Abd soft.  Mild diffuse tenderness.  No blood in ostomy bag. ?MSK/EXT:  Moves extremities. No apparent deformity. No edema.  ?SKIN: no apparent skin lesion or wound ?NEURO: Awake and alert. Oriented appropriately.  No apparent focal neuro deficit. ?PSYCH: Calm. Normal affect.  ? ?Procedures:  ?None ? ?Microbiology summarized: ?COVID-19 and influenza PCR nonreactive. ?MRSA PCR screen nonreactive. ? ?Assessment and Plan: ?* Symptomatic acute on chronic blood loss anemia due to bleeding colonic AVM and Dieulafoy lesion ?Recent Labs  ?  10/11/21 ?0754 10/30/21 ?1110 11/09/21 ?1003 11/13/21 ?1035 11/22/21 ?1110 11/23/21 ?0019 11/23/21 ?9147 11/23/21 ?1528 11/23/21 ?2020 11/24/21 ?0422  ?HGB 9.4* 7.3* 6.0* 8.1* 5.1* 8.9* 7.9* 8.8* 7.8* 8.1*  ?Transfused 4 units.  Colonoscopy with bleeding AVM and Dieulafoy lesion that was treated with APC and MR conditional clips.  EGD without significant finding.  Bleeding seems to have resolved.  Hgb leveled off at about 8.0. ?-Change PPI to p.o. ?-Advance to soft diet ?-Transfer to telemetry bed ?-Monitor H&H every 12 hours ?-PT/OT eval ? ? ?Goals of care, counseling/discussion ?Currently partial code.  Not interested in intubation or mechanical ventilation but CPR, NIPPV, DCCV and ACLS meds.  I do  not think those measures are to her best interest.  She has multiple end-stage comorbidities as above.  ?-Palliative care consulted on admission. ? ?Adenocarcinoma of left lung, stage 4 ([place]) ?Followed by Dr. Julien Nordmann.  On [place] ?-Oncology added to treatment team. ?-Continue home [place]. ? ?Irreducible ventral hernia ?Per surgery, no indication for surgical intervention.  Patient not interested either. ?-Defer to surgery ? ?Acute renal  failure superimposed on stage 3b chronic kidney disease ([place]) ?Recent Labs  ?  09/12/21 ?1530 10/09/21 ?0859 10/09/21 ?1224 10/10/21 ?0036 10/11/21 ?0754 11/09/21 ?1003 11/13/21 ?1035 11/22/21 ?1110 11/23/21 ?0026 11/24/21 ?0422  ?BUN 79* 76* 74* 72* 52* 69* 58* 63* 43* 34*  ?CREATININE 1.70* 1.98* 1.98* 2.15* 1.51* 2.10* 1.74* 2.18* 1.80* 1.72*  ?Resolved. ?-Continue monitoring ?-Avoid or minimize nephrotoxic meds ? ? ?COPD (chronic obstructive pulmonary disease) ([place]) ?Resume home inhalers ?-Xopenex and Atrovent as needed ? ?Chronic respiratory failure with hypoxia (HCC) ?Stable on home 2 L. ? ?Paroxysmal atrial fibrillation ([place]) ?On metoprolol for rate control.  Not on anticoagulation due to GI bleed ?-Resume home metoprolol at a lower dose. ? ?CHF (congestive heart failure), NYHA class III, chronic, diastolic (HCC) ?Appears euvolemic.  TTE in 04/2020 with LVEF of 60 to 65%, G2 DD and normal RVSP.  On torsemide at home. ?-Resume home torsemide at lower dose. ?-Update TTE given wide pulse pressure ?-Strict INO and daily weight ?-Monitor renal functions and electrolytes ? ?Obesity (BMI 30-39.9) ?Body mass index is 32.89 kg/m?. ? ? ?HTN (hypertension) ?Wide pulse pressure with systolic in 517G and diastolic as low as 01V and 30s.  No report of aortic insufficiency on echo in 04/2020. ?-Repeat echocardiogram ?-Resuming home torsemide and metoprolol at lower dose ? ?Gout ?Resume home allopurinol ? ? ? ? ?DVT prophylaxis:  ?SCDs Start: 11/22/21 1611 ? ?Code Status: Partial code.  No intubation or mechanical ventilation but CPR, DCCV, NIPPV, ACLS meds ?Family Communication: Updated patient's sister at bedside on 4/27. ?Level of care: Telemetry ?Status is: Inpatient ?Remains inpatient appropriate because: Symptomatic acute on chronic blood loss anemia in the setting of GI bleed, and therapy evaluation. ? ?Final disposition: TBD after therapy evaluation ?Consultants:  ?General surgery-signed off ?Gastroenterology-signed  off ? ?Sch Meds:  ?Scheduled Meds: ? chlorhexidine  15 mL Mouth Rinse BID  ? Chlorhexidine Gluconate Cloth  6 each Topical Daily  ? fluticasone furoate-vilanterol  1 puff Inhalation Daily  ? mouth rinse  15 mL Mouth Rinse q12n4p  ? [START ON 11/26/2021] pantoprazole  40 mg Intravenous Q12H  ? ?Continuous Infusions: ? sodium chloride    ? pantoprazole 8 mg/hr (11/24/21 1206)  ? ?PRN Meds:.acetaminophen **OR** acetaminophen, alum & mag hydroxide-simeth, guaiFENesin, levalbuterol **AND** ipratropium, prochlorperazine ? ?Antimicrobials: ?Anti-infectives (From admission, onward)  ? ? None  ? ?  ? ? ? ?I have personally reviewed the following labs and images: ?CBC: ?Recent Labs  ?Lab 11/22/21 ?1110 11/23/21 ?0019 11/23/21 ?4944 11/23/21 ?1528 11/23/21 ?2020 11/24/21 ?0422  ?WBC 7.1 8.6  --   --   --  8.3  ?NEUTROABS  --  6.5  --   --   --   --   ?HGB 5.1* 8.9* 7.9* 8.8* 7.8* 8.1*  ?HCT 16.3* 27.4* 24.8* 27.9* 24.5* 25.7*  ?MCV 93.1 89.5  --   --   --  92.1  ?PLT 137* 106*  --   --   --  111*  ? ?BMP &GFR ?Recent Labs  ?Lab 11/22/21 ?1110 11/23/21 ?0026 11/24/21 ?0422  ?NA 136 137 139  ?  K 4.6 5.0 4.9  ?CL 105 111 111  ?CO2 26 25 25   ?GLUCOSE 134* 107* 97  ?BUN 63* 43* 34*  ?CREATININE 2.18* 1.80* 1.72*  ?CALCIUM 9.0 8.5* 9.1  ?MG  --   --  2.6*  ?PHOS  --   --  3.8  ? ?Estimated Creatinine Clearance: 23.5 mL/min (A) (by C-G formula based on SCr of 1.72 mg/dL (H)). ?Liver & Pancreas: ?Recent Labs  ?Lab 11/22/21 ?1110 11/23/21 ?0026 11/24/21 ?0422  ?AST 17 19  --   ?ALT 7 6  --   ?ALKPHOS 40 35*  --   ?BILITOT 0.3 0.9  --   ?PROT 6.0* 5.5*  --   ?ALBUMIN 2.9* 2.6* 2.6*  ? ?No results for input(s): LIPASE, AMYLASE in the last 168 hours. ?No results for input(s): AMMONIA in the last 168 hours. ?Diabetic: ?No results for input(s): HGBA1C in the last 72 hours. ?No results for input(s): GLUCAP in the last 168 hours. ?Cardiac Enzymes: ?No results for input(s): CKTOTAL, CKMB, CKMBINDEX, TROPONINI in the last 168 hours. ?No results  for input(s): PROBNP in the last 8760 hours. ?Coagulation Profile: ?Recent Labs  ?Lab 11/23/21 ?0026  ?INR 1.1  ? ?Thyroid Function Tests: ?No results for input(s): TSH, T4TOTAL, FREET4, T3FREE, THYROIDAB in the last 72 hours.

## 2021-11-25 ENCOUNTER — Other Ambulatory Visit: Payer: Self-pay | Admitting: Internal Medicine

## 2021-11-25 DIAGNOSIS — I5042 Chronic combined systolic (congestive) and diastolic (congestive) heart failure: Secondary | ICD-10-CM

## 2021-11-25 DIAGNOSIS — I480 Paroxysmal atrial fibrillation: Secondary | ICD-10-CM

## 2021-11-25 DIAGNOSIS — I10 Essential (primary) hypertension: Secondary | ICD-10-CM

## 2021-11-25 LAB — CBC
HCT: 24.4 % — ABNORMAL LOW (ref 36.0–46.0)
Hemoglobin: 7.6 g/dL — ABNORMAL LOW (ref 12.0–15.0)
MCH: 28.6 pg (ref 26.0–34.0)
MCHC: 31.1 g/dL (ref 30.0–36.0)
MCV: 91.7 fL (ref 80.0–100.0)
Platelets: 106 10*3/uL — ABNORMAL LOW (ref 150–400)
RBC: 2.66 MIL/uL — ABNORMAL LOW (ref 3.87–5.11)
RDW: 14.8 % (ref 11.5–15.5)
WBC: 6.9 10*3/uL (ref 4.0–10.5)
nRBC: 0 % (ref 0.0–0.2)

## 2021-11-25 LAB — RENAL FUNCTION PANEL
Albumin: 2.5 g/dL — ABNORMAL LOW (ref 3.5–5.0)
Anion gap: <3 — ABNORMAL LOW (ref 5–15)
BUN: 32 mg/dL — ABNORMAL HIGH (ref 8–23)
CO2: 26 mmol/L (ref 22–32)
Calcium: 8.8 mg/dL — ABNORMAL LOW (ref 8.9–10.3)
Chloride: 110 mmol/L (ref 98–111)
Creatinine, Ser: 1.68 mg/dL — ABNORMAL HIGH (ref 0.44–1.00)
GFR, Estimated: 30 mL/min — ABNORMAL LOW (ref >60)
Glucose, Bld: 93 mg/dL (ref 70–99)
Phosphorus: 3.8 mg/dL (ref 2.5–4.6)
Potassium: 4.9 mmol/L (ref 3.5–5.1)
Sodium: 138 mmol/L (ref 135–145)

## 2021-11-25 LAB — HEMOGLOBIN AND HEMATOCRIT, BLOOD
HCT: 27 % — ABNORMAL LOW (ref 36.0–46.0)
Hemoglobin: 8.6 g/dL — ABNORMAL LOW (ref 12.0–15.0)

## 2021-11-25 LAB — MAGNESIUM: Magnesium: 2 mg/dL (ref 1.7–2.4)

## 2021-11-25 LAB — PREPARE RBC (CROSSMATCH)

## 2021-11-25 MED ORDER — ISOSORB DINITRATE-HYDRALAZINE 20-37.5 MG PO TABS
1.0000 | ORAL_TABLET | Freq: Two times a day (BID) | ORAL | Status: DC
  Administered 2021-11-26: 1 via ORAL
  Filled 2021-11-25 (×2): qty 1

## 2021-11-25 MED ORDER — METOPROLOL TARTRATE 25 MG PO TABS
25.0000 mg | ORAL_TABLET | Freq: Two times a day (BID) | ORAL | Status: DC
  Administered 2021-11-25 – 2021-11-26 (×2): 25 mg via ORAL
  Filled 2021-11-25 (×2): qty 1

## 2021-11-25 MED ORDER — SODIUM CHLORIDE 0.9% IV SOLUTION: Freq: Once | INTRAVENOUS | Status: AC

## 2021-11-25 NOTE — Progress Notes (Signed)
Report called to Louanna Raw RN. All questions answered at this time. All pt belongings and paper chart transported with pt. Pt was transported upstairs in the bed by RN, on tele. Handoff completed.  ?

## 2021-11-25 NOTE — Progress Notes (Signed)
?PROGRESS NOTE ? ?CHRISOULA ZEGARRA HYQ:[id number] DOB: 08-12-1940  ? ?PCP: [name], MD ? ?Patient is from: Home ? ?DOA: 11/22/2021 LOS: 3 ? ?Chief complaints ?No chief complaint on file. ?  ? ?Brief Narrative / Interim history: ?81 year old F with PMH of stage IV NSCLC with wide spread metastasis on [place], recurrent GI bleed due to small and large bowel AVMs, PAF not on AC due to GIB, partial colectomy and colostomy due to incarcerated abdominal hernia, CKD-3B, MGUS, ulcerative colitis, COPD, diastolic CHF, OSA,, chronic hypoxic RF on 2 L and recurrent malignant effusion with Pleurx returning with bleeding into ostomy bag for about 2 weeks.  Hgb 5.1 (from 8.1 on 4/17).  She was symptomatic with progressive fatigue and shortness of breath.  She was transfused 4 units.  CT abdomen and pelvis with contrast without extravasation but ventral abdominal wall hernia containing nonobstructed loops of large and small bowel, dense left LLL atelectasis and effusion unchanged from prior.  General surgery and GI consulted ? ?Patient underwent EGD and colonoscopy on 4/27.  EGD without significant finding.  Colonoscopy with 3 bleeding colonic angiectasias that were treated with APC, and actively bleeding Dieulofoy's lesion in ascending colon that was clipped with MR conditional clips.  Hgb leveled off between 7 and 8.  No further bleeding.  Tolerated soft diet.  GI signed off. ? ?Patient is very debilitated.  PT OT recommended home health.  Likely home on 4/30 after blood transfusion.   ? ?Subjective: ?Seen and examined earlier this morning.  No major events overnight of this morning.  She reports improvement in hip pain.  She is complaining of productive cough.  No significant shortness of breath or chest pain except when she is coughing.  Denies hemoptysis. ? ?Objective: ?Vitals:  ? 11/25/21 0803 11/25/21 0804 11/25/21 0842 11/25/21 0900  ?BP: (!) 163/33   (!) 161/65  ?Pulse: 63 60  67  ?Resp: 19 17  16   ?Temp:   98.1 ?F  (36.7 ?C)   ?TempSrc:   Oral   ?SpO2: 100% 100%  100%  ?Weight:      ?Height:      ? ? ?Examination ? ?GENERAL: No apparent distress.  Nontoxic. ?HEENT: MMM.  Vision and hearing grossly intact.  ?NECK: Supple.  No apparent JVD.  ?RESP: 100% on 2 L.  No IWOB.  Fair aeration bilaterally.  No crackles or wheeze. ?CVS:  RRR. Heart sounds normal.  ?ABD/GI/GU: BS+. Abd soft, NTND.  Ostomy bag with normal looking stool. ?MSK/EXT:  Moves extremities. No apparent deformity.  Trace BLE edema. ?SKIN: no apparent skin lesion or wound ?NEURO: Awake and alert. Oriented appropriately.  No apparent focal neuro deficit. ?PSYCH: Calm. Normal affect.  ? ?Procedures:  ?None ? ?Microbiology summarized: ?COVID-19 and influenza PCR nonreactive. ?MRSA PCR screen nonreactive. ? ?Assessment and Plan: ?* Symptomatic acute on chronic blood loss anemia due to bleeding colonic AVM and Dieulafoy lesion ?Recent Labs  ?  11/09/21 ?1003 11/13/21 ?1035 11/22/21 ?1110 11/23/21 ?0019 11/23/21 ?9528 11/23/21 ?1528 11/23/21 ?2020 11/24/21 ?0422 11/24/21 ?1641 11/25/21 ?4132  ?HGB 6.0* 8.1* 5.1* 8.9* 7.9* 8.8* 7.8* 8.1* 7.7* 7.6*  ?Transfused 4 units.  Colonoscopy with bleeding AVM and Dieulafoy lesion that was treated with APC and MR conditional clips.  EGD without significant finding.  Bleeding seems to have resolved.  Hgb leveled off between 7 and 8.  Tolerating soft diet. ?-Continue p.o. Protonix ?-Transfuse 1 unit and check posttransfusion H&H ?-Continue soft diet. ?-PT/OT eval ? ? ?  Goals of care, counseling/discussion ?Currently partial code.  Not interested in intubation or mechanical ventilation but CPR, NIPPV, DCCV and ACLS meds.  I do not think those measures are to her best interest.  She has multiple end-stage comorbidities as above.  ?-Appreciate input by palliative care-continue current scope of care ? ?Adenocarcinoma of left lung, stage 4 ([place]) ?Followed by Dr. Julien Nordmann.  On [place] ?-Oncology added to treatment team. ?-Continue home  [place]. ? ?Irreducible ventral hernia ?Per surgery, no indication for surgical intervention.  Patient not interested either. ?-Defer to surgery ? ?Acute renal failure superimposed on stage 3b chronic kidney disease ([place]) ?Recent Labs  ?  10/09/21 ?0859 10/09/21 ?1224 10/10/21 ?0036 10/11/21 ?0754 11/09/21 ?1003 11/13/21 ?1035 11/22/21 ?1110 11/23/21 ?0026 11/24/21 ?0422 11/25/21 ?6045  ?BUN 76* 74* 72* 52* 69* 58* 63* 43* 34* 32*  ?CREATININE 1.98* 1.98* 2.15* 1.51* 2.10* 1.74* 2.18* 1.80* 1.72* 1.68*  ?Resolved. ?-Continue monitoring ?-Avoid or minimize nephrotoxic meds ? ? ?COPD (chronic obstructive pulmonary disease) ([place]) ?Resume home inhalers ?-Xopenex and Atrovent as needed ? ?Chronic respiratory failure with hypoxia (HCC) ?Stable on home 2 L. ? ?Paroxysmal atrial fibrillation ([place]) ?On metoprolol for rate control.  Not on anticoagulation due to GI bleed ?-Increase metoprolol to home dose. ? ?CHF (congestive heart failure), NYHA class III, chronic, diastolic (HCC) ?TTE with LVEF of 70 to 75%, no RWMA but G2 DD, severe LAE, moderate MVR and normal RVSP.  On torsemide 40 mg daily at home.  Resumed home torsemide at 20 mg daily.  She had about 1.6 L UOP/24 hours.  Does not appear fluid overloaded. ?-Continue torsemide at 20 mg daily ?-Strict INO and daily weight ?-Monitor renal functions and electrolytes ? ?HTN (hypertension) ?Wide pulse pressure with SBP in 160s and DBP ranging from 30s to 60s.  TTE as above.  ?-Increase metoprolol to home dose ?-Resume home BiDil. ?-Torsemide as above. ? ?Obesity (BMI 30-39.9) ?Body mass index is 32.89 kg/m?. ? ? ?Gout ?Resume home allopurinol ? ? ? ? ?DVT prophylaxis:  ?SCDs Start: 11/22/21 1611 ? ?Code Status: Partial code.  No intubation or mechanical ventilation but CPR, DCCV, NIPPV, ACLS meds ? ?Family Communication: None at bedside. ?Level of care: Telemetry ?Status is: Inpatient ?Remains inpatient appropriate because: Symptomatic acute on chronic blood loss anemia in  the setting of GI bleed requiring blood transfusion ? ?Final disposition: Likely home with home health on 4/30. ?Consultants:  ?General surgery-signed off ?Gastroenterology-signed off ? ?Sch Meds:  ?Scheduled Meds: ? sodium chloride   Intravenous Once  ? allopurinol  100 mg Oral Daily  ? chlorhexidine  15 mL Mouth Rinse BID  ? Chlorhexidine Gluconate Cloth  6 each Topical Daily  ? fluticasone furoate-vilanterol  1 puff Inhalation Daily  ? isosorbide-hydrALAZINE  1 tablet Oral BID  ? mouth rinse  15 mL Mouth Rinse q12n4p  ? metoprolol tartrate  25 mg Oral BID  ? osimertinib mesylate  80 mg Oral Daily  ? [START ON 11/26/2021] pantoprazole  40 mg Intravenous Q12H  ? torsemide  20 mg Oral Daily  ? ?Continuous Infusions: ? ? ?PRN Meds:.acetaminophen **OR** acetaminophen, alum & mag hydroxide-simeth, guaiFENesin, levalbuterol **AND** ipratropium, prochlorperazine ? ?Antimicrobials: ?Anti-infectives (From admission, onward)  ? ? None  ? ?  ? ? ? ?I have personally reviewed the following labs and images: ?CBC: ?Recent Labs  ?Lab 11/22/21 ?1110 11/23/21 ?0019 11/23/21 ?4098 11/23/21 ?1528 11/23/21 ?2020 11/24/21 ?0422 11/24/21 ?1641 11/25/21 ?1191  ?WBC 7.1 8.6  --   --   --  8.3  --  6.9  ?NEUTROABS  --  6.5  --   --   --   --   --   --   ?HGB 5.1* 8.9*   < > 8.8* 7.8* 8.1* 7.7* 7.6*  ?HCT 16.3* 27.4*   < > 27.9* 24.5* 25.7* 24.2* 24.4*  ?MCV 93.1 89.5  --   --   --  92.1  --  91.7  ?PLT 137* 106*  --   --   --  111*  --  106*  ? < > = values in this interval not displayed.  ? ?BMP &GFR ?Recent Labs  ?Lab 11/22/21 ?1110 11/23/21 ?0026 11/24/21 ?0422 11/25/21 ?6837  ?NA 136 137 139 138  ?K 4.6 5.0 4.9 4.9  ?CL 105 111 111 110  ?CO2 26 25 25 26   ?GLUCOSE 134* 107* 97 93  ?BUN 63* 43* 34* 32*  ?CREATININE 2.18* 1.80* 1.72* 1.68*  ?CALCIUM 9.0 8.5* 9.1 8.8*  ?MG  --   --  2.6* 2.0  ?PHOS  --   --  3.8 3.8  ? ?Estimated Creatinine Clearance: 23.5 mL/min (A) (by C-G formula based on SCr of 1.68 mg/dL (H)). ?Liver &  Pancreas: ?Recent Labs  ?Lab 11/22/21 ?1110 11/23/21 ?0026 11/24/21 ?0422 11/25/21 ?2902  ?AST 17 19  --   --   ?ALT 7 6  --   --   ?ALKPHOS 40 35*  --   --   ?BILITOT 0.3 0.9  --   --   ?PROT 6.0* 5.5*  --   --   ?ALBUMI

## 2021-11-25 NOTE — Progress Notes (Signed)
0845 Report rec'd from [facility]. Patient rec'd on PCU. Oriented to room.  ? ?1020 Order rec'd to transfuse 1 unit PRBC. Current IV infiltrated. IV team alerted need for new site. Blood bank aware of crossmatch. Will test for compatibility as patient has antibodies.  ?

## 2021-11-26 DIAGNOSIS — I872 Venous insufficiency (chronic) (peripheral): Secondary | ICD-10-CM

## 2021-11-26 LAB — RENAL FUNCTION PANEL
Albumin: 2.5 g/dL — ABNORMAL LOW (ref 3.5–5.0)
Anion gap: 5 (ref 5–15)
BUN: 34 mg/dL — ABNORMAL HIGH (ref 8–23)
CO2: 26 mmol/L (ref 22–32)
Calcium: 9 mg/dL (ref 8.9–10.3)
Chloride: 109 mmol/L (ref 98–111)
Creatinine, Ser: 1.64 mg/dL — ABNORMAL HIGH (ref 0.44–1.00)
GFR, Estimated: 31 mL/min — ABNORMAL LOW (ref >60)
Glucose, Bld: 98 mg/dL (ref 70–99)
Phosphorus: 4.1 mg/dL (ref 2.5–4.6)
Potassium: 4.4 mmol/L (ref 3.5–5.1)
Sodium: 140 mmol/L (ref 135–145)

## 2021-11-26 LAB — CBC
HCT: 26.6 % — ABNORMAL LOW (ref 36.0–46.0)
Hemoglobin: 8.4 g/dL — ABNORMAL LOW (ref 12.0–15.0)
MCH: 28.4 pg (ref 26.0–34.0)
MCHC: 31.6 g/dL (ref 30.0–36.0)
MCV: 89.9 fL (ref 80.0–100.0)
Platelets: 103 10*3/uL — ABNORMAL LOW (ref 150–400)
RBC: 2.96 MIL/uL — ABNORMAL LOW (ref 3.87–5.11)
RDW: 15.2 % (ref 11.5–15.5)
WBC: 7.7 10*3/uL (ref 4.0–10.5)
nRBC: 0 % (ref 0.0–0.2)

## 2021-11-26 LAB — MAGNESIUM: Magnesium: 2.2 mg/dL (ref 1.7–2.4)

## 2021-11-26 NOTE — Discharge Summary (Signed)
? ?Physician Discharge Summary  ?[name] LTJ:[id number] DOB: 1940/11/30 DOA: 11/22/2021 ? ?PCP: [name], MD ? ?Admit date: 11/22/2021 ?Discharge date: 11/26/2021 ?Admitted From: Home ?Disposition: Home ?Recommendations for Outpatient Follow-up:  ?Follow-up with PCP in 1 to 2 weeks. ?Please obtain CBC/BMP/Mag at follow up ?Please follow up on the following pending results: None ? ?Home Health: PT/OT/RN/aide ?Equipment/Devices: Patient has home oxygen, rolling walker and bedside commode ? ?Discharge Condition: Stable but guarded prognosis ?CODE STATUS: Partial code-everything except intubation and mechanical ventilation. ? Follow-up Information   ? ? [name], MD. Schedule an appointment as soon as possible for a visit in 1 week(s).   ?Specialty: Internal Medicine ?Contact information: ?[place] [address] ?[phone] ? ? ?  ?  ? ? [facility] Follow up.   ?Specialty: Home Health Services ?Why: The home health will call you to schedule a time to visit. ?Contact information: ?[address] ?[address] ?[place] [address] ?[phone] ? ? ?  ?  ? ?  ?  ? ?  ? ? ?Hospital course ?81 year old F with PMH of stage IV NSCLC with wide spread metastasis on [place], recurrent GI bleed due to small and large bowel AVMs, PAF not on AC due to GIB, partial colectomy and colostomy due to incarcerated abdominal hernia, CKD-3B, MGUS, ulcerative colitis, COPD, diastolic CHF, OSA,, chronic hypoxic RF on 2 L and recurrent malignant effusion with Pleurx returning with bleeding into ostomy bag for about 2 weeks.  Hgb 5.1 (from 8.1 on 4/17).  She was symptomatic with progressive fatigue and shortness of breath.  She was transfused 4 units.  CT abdomen and pelvis with contrast without extravasation but ventral abdominal wall hernia containing nonobstructed loops of large and small bowel, dense left LLL atelectasis and effusion unchanged from prior.  General surgery and GI  consulted ? ?Patient underwent EGD and colonoscopy on 4/27.  EGD without significant finding.  Colonoscopy with 3 bleeding colonic angiectasias that were treated with APC, and actively bleeding Dieulofoy's lesion in ascending colon that was clipped with MR conditional clips.  Hgb leveled off between 7 and 8.  No further bleeding.  She was transfused additional 1 unit with appropriate response.  Tolerated soft diet.  GI signed off. ? ?Home health PT/OT ordered as recommended by therapy.  Also added home health RN and aide.   ? ?See individual problem list below for more on hospital course. ? ?Problems addressed during this hospitalization ?Problem  ?Symptomatic acute on chronic blood loss anemia due to bleeding colonic AVM and Dieulafoy lesion  ?Goals of Care, Counseling/Discussion  ?Adenocarcinoma of Left Lung, Stage 4 (Hcc)  ?Irreducible Ventral Hernia  ?Acute Renal Failure Superimposed On Stage 3b Chronic Kidney Disease (Hcc)  ?Copd (Chronic Obstructive Pulmonary Disease) (Hcc)  ?Chronic Respiratory Failure With Hypoxia (Hcc)  ?Paroxysmal Atrial Fibrillation (Hcc)  ?Chf (Congestive Heart Failure), Nyha Class III, Chronic, Diastolic (Hcc)  ?Htn (Hypertension)  ?Obesity (Bmi 30-39.9)  ?Gout  ?  ? ?  ?  ?Assessment and Plan: ?* Symptomatic acute on chronic blood loss anemia due to bleeding colonic AVM and Dieulafoy lesion ?Recent Labs  ?  11/22/21 ?1110 11/23/21 ?0019 11/23/21 ?5726 11/23/21 ?1528 11/23/21 ?2020 11/24/21 ?0422 11/24/21 ?1641 11/25/21 ?2035 11/25/21 ?1949 11/26/21 ?0319  ?HGB 5.1* 8.9* 7.9* 8.8* 7.8* 8.1* 7.7* 7.6* 8.6* 8.4*  ?Transfused 5 units total.  Colonoscopy with bleeding AVM and Dieulafoy lesion that was treated with APC and MR conditional clips.  EGD without significant finding.  Bleeding resolved.  Tolerating soft diet. ?-Recheck CBC in 1 to 2 weeks ?-Patient to continue soft diet over the next 3 to 4 days ? ?Goals of care, counseling/discussion ?Currently partial code.  Not interested in  intubation or mechanical ventilation but CPR, NIPPV, DCCV and ACLS meds.  I do not think those measures are to her best interest.  She has multiple end-stage comorbidities as above.  ? ?Adenocarcinoma of left lung, stage 4 ([place]) ?Followed by Dr. Julien Nordmann.  On [place] ?-Oncology added to treatment team. ?-Continue home [place]. ? ?Irreducible ventral hernia ?Per surgery, no indication for surgical intervention.  Patient not interested either. ? ?Acute renal failure superimposed on stage 3b chronic kidney disease ([place]) ?Recent Labs  ?  10/09/21 ?1224 10/10/21 ?0036 10/11/21 ?0754 11/09/21 ?1003 11/13/21 ?1035 11/22/21 ?1110 11/23/21 ?0026 11/24/21 ?0422 11/25/21 ?0932 11/26/21 ?0319  ?BUN 74* 72* 52* 69* 58* 63* 43* 34* 32* 34*  ?CREATININE 1.98* 2.15* 1.51* 2.10* 1.74* 2.18* 1.80* 1.72* 1.68* 1.64*  ?Resolved. ?-Continue monitoring ?-Recheck renal function in 1 to 2 weeks ? ? ?COPD (chronic obstructive pulmonary disease) ([place]) ?Continue home meds ? ?Chronic respiratory failure with hypoxia (HCC) ?Stable on home 2 L. ? ?Paroxysmal atrial fibrillation ([place]) ?On metoprolol for rate control.  Not on anticoagulation due to GI bleed ?-Continue home metoprolol. ? ?CHF (congestive heart failure), NYHA class III, chronic, diastolic (HCC) ?TTE with LVEF of 70 to 75%, no RWMA but G2 DD, severe LAE, moderate MVR and normal RVSP.  On torsemide 40 mg daily at home.  Appears euvolemic on exam. ?-Continue home medications. ?-Outpatient follow-up with cardiology as previously planned ? ?HTN (hypertension) ?She has wide pulse pressure with low DBP.  TTE as above.  ?Continue home medications. ? ?Obesity (BMI 30-39.9) ?Body mass index is 32.89 kg/m?. ? ? ?Gout ?Resume home allopurinol ? ? ?Vital signs ?Vitals:  ? 11/25/21 1650 11/25/21 2117 11/26/21 0400 11/26/21 0516  ?BP: (!) 125/31 (!) 137/40  (!) 151/43  ?Pulse: 67 63  66  ?Temp: 98.9 ?F (37.2 ?C) 98.3 ?F (36.8 ?C)  98.6 ?F (37 ?C)  ?Resp: 20 16  17   ?Height:      ?Weight:   78.6  kg   ?SpO2: 98% 96%  98%  ?TempSrc: Oral Oral  Oral  ?BMI (Calculated):   33.85   ?  ? ?Discharge exam ? ?GENERAL: No apparent distress.  Nontoxic. ?HEENT: MMM.  Vision and hearing grossly intact.  ?NECK: Supple.  No apparent JVD.  ?RESP:  No IWOB.  Fair aeration bilaterally. ?CVS:  RRR. Heart sounds normal.  ?ABD/GI/GU: BS+. Abd soft, NTND.  Colostomy with normal stool.  No blood. ?MSK/EXT:  Moves extremities. No apparent deformity. No edema.  ?SKIN: no apparent skin lesion or wound ?NEURO: Awake and alert. Oriented appropriately.  No apparent focal neuro deficit. ?PSYCH: Calm. Normal affect.  ? ?Discharge Instructions ?Discharge Instructions   ? ? Call MD for:  difficulty breathing, headache or visual disturbances   Complete by: As directed ?  ? Call MD for:  extreme fatigue   Complete by: As directed ?  ? Call MD for:  persistant dizziness or light-headedness   Complete by: As directed ?  ? Diet - low sodium heart healthy   Complete by: As directed ?  ? Discharge instructions   Complete by: As directed ?  ? It has been a pleasure taking care of you! ? ?You were hospitalized due to bleeding likely from abnormal blood  vessels in your colon that was treated endoscopically.  Your bleeding seems to have stopped.  You also received blood transfusion.  Your hemoglobin remained stable.  Continue soft diet for the next 3 to 4 days.  Avoid any over-the-counter pain medication except plain Tylenol.  Follow-up with your primary care doctor in 1 to 2 weeks or sooner if needed.  Follow-up with gastroenterologist and oncologist per their recommendation. ? ?Take care,  ? Discharge wound care:   Complete by: As directed ?  ? Wound care to right buttock Stage 2 pressure injury (partial thickness): Cleanse with NS, pat dry. Cover with folded piece of xeroform gauze, top with dry gauze and cover with silicone foam dressing. Change xeroform daily, may reuse silicone foam for up to 3 days. Turn and reposition to minimize time in the  supine position.  ? Increase activity slowly   Complete by: As directed ?  ? ?  ? ?Allergies as of 11/26/2021   ? ?   Reactions  ? Celebrex [celecoxib] Swelling, Other (See Comments)  ? Ankles swell  ? Amlodipine Be

## 2021-11-26 NOTE — TOC Transition Note (Signed)
Transition of Care (TOC) - CM/SW Discharge Note ? ? ?Patient Details  ?Name: [name] ?MRN: [id number] ?Date of Birth: 04-05-1941 ? ?Transition of Care (TOC) CM/SW Contact:  ?Tawanna Cooler, RN ?Phone Number: ?11/26/2021, 9:38 AM ? ? ?Clinical Narrative:    ? ?Called patient in room and discussed home health.  She is agreeable to home health, states she has had it before with [facility].  Would like [facility] again if possible.  CM contacted [facility] rep, who agrees to accept patient on service.  Information added to the AVS.  ? ?Final next level of care: [place] ?Barriers to Discharge: Barriers Resolved ? ? ?Patient Goals and CMS Choice ?  ?  ?Choice offered to / list presented to : Patient ? ? ?Discharge Plan and Services ? DME Agency: NA ?  ?HH Arranged: PT, OT ?[place] Agency: [place] ?Date HH Agency Contacted: 11/26/21 ?Time [place]: [phone] ?Representative spoke with at [place]: Tommi Rumps ? ? ?Readmission Risk Interventions ? ?  11/22/2021  ?  4:25 PM 10/11/2021  ? 10:30 AM  ?Readmission Risk Prevention Plan  ?Transportation Screening Complete Complete  ?PCP or Specialist Appt within 3-5 Days Complete Complete  ?[place] or Home Care Consult Complete Complete  ?Social Work Consult for [place] Planning/Counseling Complete Complete  ?Palliative Care Screening Complete Complete  ?Medication Review Press photographer)  Complete  ? ? ? ? ? ?

## 2021-11-26 NOTE — Progress Notes (Signed)
Physical Therapy Treatment ?Patient Details ?Name: [name] ?MRN: [id number] ?DOB: 1941/05/22 ?Today's Date: 11/26/2021 ? ? ?History of Present Illness 81 year old F with PMH of stage IV NSCLC with wide spread metastasis , recurrent GI bleed due to small and large bowel AVMs, PAF not on AC due to GIB, partial colectomy and colostomy due to incarcerated abdominal hernia, CKD-3B, MGUS, ulcerative colitis, COPD, diastolic CHF, OSA,, chronic hypoxic RF on 2 L and recurrent malignant effusion with Pleurx removed 10/17/21,returning 11/23/21  with bleeding into ostomy bag for about 2 weeks.  Hgb 5.1.  She was symptomatic with progressive fatigue and shortness of breath ? ?  ?PT Comments  ? ? General Comments: AxOx 3 very pleasant and eager to go home today.  General transfer comment: good use of hands to steady self.  Also performed a toilet transfer.  Pt self able to perform peri care.  Steady. General Gait Details: assisted with amb to anf from bathroom 12 feet x 2 at Supervision/MinGuard Assist.  Pt remained on 2 lts (home oxygen level) sats 1005 with activity.  Tolerated well.  ?Pt plans to return home with family.  Sister present during session. ?  ?Recommendations for follow up therapy are one component of a multi-disciplinary discharge planning process, led by the attending physician.  Recommendations may be updated based on patient status, additional functional criteria and insurance authorization. ? ?Follow Up Recommendations ? Home health PT ?  ?  ?Assistance Recommended at Discharge Frequent or constant Supervision/Assistance  ?Patient can return home with the following A lot of help with walking and/or transfers;A little help with bathing/dressing/bathroom;Help with stairs or ramp for entrance;Assistance with cooking/housework;Assist for transportation ?  ?Equipment Recommendations ? None recommended by PT  ?  ?Recommendations for Other Services   ? ? ?  ?Precautions / Restrictions Precautions ?Precautions:  None ?Precaution Comments: 2 lts oxygen, colostomy ?Restrictions ?Weight Bearing Restrictions: No  ?  ? ?Mobility ? Bed Mobility ?  ?  ?  ?  ?  ?  ?  ?General bed mobility comments: OOB in recliner on 2 lts at rest 96% ?  ? ?Transfers ?Overall transfer level: Needs assistance ?Equipment used: Rolling walker (2 wheels) ?Transfers: Sit to/from Stand, Bed to chair/wheelchair/BSC ?Sit to Stand: Supervision ?  ?  ?  ?  ?  ?General transfer comment: good use of hands to steady self.  Also performed a toilet transfer.  Pt self able to perform peri care.  Steady. ?  ? ?Ambulation/Gait ?Ambulation/Gait assistance: Supervision, Min guard ?Gait Distance (Feet): 24 Feet (12 feet x 2 to and from bathroom) ?Assistive device: Rolling walker (2 wheels) ?Gait Pattern/deviations: Step-to pattern, Trunk flexed ?Gait velocity: decreased ?  ?  ?General Gait Details: assisted with amb to anf from bathroom 12 feet x 2 at Supervision/MinGuard Assist.  Pt remained on 2 lts (home oxygen level) sats 1005 with activity.  Tolerated well. ? ? ?Stairs ?  ?  ?  ?  ?  ? ? ?Wheelchair Mobility ?  ? ?Modified Rankin (Stroke Patients Only) ?  ? ? ?  ?Balance   ?  ?  ?  ?  ?  ?  ?  ?  ?  ?  ?  ?  ?  ?  ?  ?  ?  ?  ?  ? ?  ?Cognition Arousal/Alertness: Awake/alert ?Behavior During Therapy: [facility] for tasks assessed/performed ?Overall Cognitive Status: Within Functional Limits for tasks assessed ?  ?  ?  ?  ?  ?  ?  ?  ?  ?  ?  ?  ?  ?  ?  ?  ?  General Comments: AxOx 3 very pleasant and eager to go home today ?  ?  ? ?  ?Exercises   ? ?  ?General Comments   ?  ?  ? ?Pertinent Vitals/Pain Pain Assessment ?Pain Assessment: No/denies pain  ? ? ?Home Living   ?  ?  ?  ?  ?  ?  ?  ?  ?  ?   ?  ?Prior Function    ?  ?  ?   ? ?PT Goals (current goals can now be found in the care plan section) Progress towards PT goals: Progressing toward goals ? ?  ?Frequency ? ? ?   ? ? ? ?  ?PT Plan Current plan remains appropriate  ? ? ?Co-evaluation   ?  ?  ?  ?  ? ?  ?AM-PAC  PT "6 Clicks" Mobility   ?Outcome Measure ? Help needed turning from your back to your side while in a flat bed without using bedrails?: None ?Help needed moving from lying on your back to sitting on the side of a flat bed without using bedrails?: None ?Help needed moving to and from a bed to a chair (including a wheelchair)?: A Little ?Help needed standing up from a chair using your arms (e.g., wheelchair or bedside chair)?: A Little ?Help needed to walk in hospital room?: A Little ?Help needed climbing 3-5 steps with a railing? : A Little ?6 Click Score: 20 ? ?  ?End of Session Equipment Utilized During Treatment: Gait belt ?Activity Tolerance: Patient tolerated treatment well ?Patient left: in chair;with call bell/phone within reach;with chair alarm set ?Nurse Communication: Mobility status ?PT Visit Diagnosis: Unsteadiness on feet (R26.81);Muscle weakness (generalized) (M62.81) ?  ? ? ?Time: 6433-2951 ?PT Time Calculation (min) (ACUTE ONLY): 27 min ? ?Charges:  $Gait Training: 8-22 mins ?$Therapeutic Activity: 8-22 mins          ?          ?Rica Koyanagi  PTA ?Acute  Rehabilitation Services ?Pager      [phone] ?Office      [phone] ? ?

## 2021-11-27 ENCOUNTER — Other Ambulatory Visit: Payer: Self-pay | Admitting: Internal Medicine

## 2021-11-27 DIAGNOSIS — I10 Essential (primary) hypertension: Secondary | ICD-10-CM

## 2021-11-27 DIAGNOSIS — I5042 Chronic combined systolic (congestive) and diastolic (congestive) heart failure: Secondary | ICD-10-CM

## 2021-11-27 DIAGNOSIS — I480 Paroxysmal atrial fibrillation: Secondary | ICD-10-CM

## 2021-11-27 LAB — BPAM RBC
Blood Product Expiration Date: 202305222359
Blood Product Expiration Date: 202305252359
Blood Product Expiration Date: 202305252359
Blood Product Expiration Date: 202305252359
ISSUE DATE / TIME: 202304261332
ISSUE DATE / TIME: 202304261638
ISSUE DATE / TIME: 202304261948
ISSUE DATE / TIME: 202304291431
Unit Type and Rh: 5100
Unit Type and Rh: 5100
Unit Type and Rh: 5100
Unit Type and Rh: 5100

## 2021-11-27 LAB — TYPE AND SCREEN
ABO/RH(D): O POS
Antibody Screen: NEGATIVE
Donor AG Type: NEGATIVE
Donor AG Type: NEGATIVE
Donor AG Type: NEGATIVE
Donor AG Type: NEGATIVE
Unit division: 0
Unit division: 0
Unit division: 0
Unit division: 0

## 2021-11-28 ENCOUNTER — Telehealth: Payer: Self-pay

## 2021-11-28 NOTE — Telephone Encounter (Signed)
Spoke with patient regarding scheduling a Palliative Care follow up with Santiago Glad NP. Patient declined scheduling at this time. She states she has to many appointments right now. She states she will call PRN.  ?

## 2021-11-29 ENCOUNTER — Telehealth: Payer: Self-pay | Admitting: Internal Medicine

## 2021-11-29 DIAGNOSIS — L89312 Pressure ulcer of right buttock, stage 2: Secondary | ICD-10-CM | POA: Diagnosis not present

## 2021-11-29 DIAGNOSIS — K9401 Colostomy hemorrhage: Secondary | ICD-10-CM | POA: Diagnosis not present

## 2021-11-29 DIAGNOSIS — I130 Hypertensive heart and chronic kidney disease with heart failure and stage 1 through stage 4 chronic kidney disease, or unspecified chronic kidney disease: Secondary | ICD-10-CM | POA: Diagnosis not present

## 2021-11-29 DIAGNOSIS — D62 Acute posthemorrhagic anemia: Secondary | ICD-10-CM | POA: Diagnosis not present

## 2021-11-29 DIAGNOSIS — Q2733 Arteriovenous malformation of digestive system vessel: Secondary | ICD-10-CM | POA: Diagnosis not present

## 2021-11-29 NOTE — Telephone Encounter (Signed)
Nurse Beth from home health services called requesting home nursing orders for pt. Nurse states that pt was in hospital for 'crackles in L lower lobe' and is requesting verbal orders for nursing care 1x a week for 4 weeks.  ? ?Also states pt hasn't been taking her iron supplements and wanted [place] to be aware. ? ?Please call Beth to confirm: ?[phone] ?

## 2021-11-29 NOTE — Telephone Encounter (Signed)
Verbal order given for nursing to [facility]. While on the phone she also requested a verbal order for speech therapy also, I have given the ok to proceed with that also.  ?

## 2021-11-30 ENCOUNTER — Telehealth: Payer: Self-pay | Admitting: Internal Medicine

## 2021-11-30 DIAGNOSIS — Q2733 Arteriovenous malformation of digestive system vessel: Secondary | ICD-10-CM | POA: Diagnosis not present

## 2021-11-30 DIAGNOSIS — K9401 Colostomy hemorrhage: Secondary | ICD-10-CM | POA: Diagnosis not present

## 2021-11-30 DIAGNOSIS — I130 Hypertensive heart and chronic kidney disease with heart failure and stage 1 through stage 4 chronic kidney disease, or unspecified chronic kidney disease: Secondary | ICD-10-CM | POA: Diagnosis not present

## 2021-11-30 DIAGNOSIS — L89312 Pressure ulcer of right buttock, stage 2: Secondary | ICD-10-CM | POA: Diagnosis not present

## 2021-11-30 DIAGNOSIS — D62 Acute posthemorrhagic anemia: Secondary | ICD-10-CM | POA: Diagnosis not present

## 2021-11-30 NOTE — Telephone Encounter (Signed)
Home Health verbal orders-caller/Agency: [facility]/ [facility] ? ?Callback number: [phone] ? ?Requesting OT/PT/Skilled nursing/Social Work/Speech: Pt ? ? ?Frequency: 1w5 extension  ?

## 2021-12-01 DIAGNOSIS — L89312 Pressure ulcer of right buttock, stage 2: Secondary | ICD-10-CM | POA: Diagnosis not present

## 2021-12-01 DIAGNOSIS — I130 Hypertensive heart and chronic kidney disease with heart failure and stage 1 through stage 4 chronic kidney disease, or unspecified chronic kidney disease: Secondary | ICD-10-CM | POA: Diagnosis not present

## 2021-12-01 DIAGNOSIS — D62 Acute posthemorrhagic anemia: Secondary | ICD-10-CM | POA: Diagnosis not present

## 2021-12-01 DIAGNOSIS — Q2733 Arteriovenous malformation of digestive system vessel: Secondary | ICD-10-CM | POA: Diagnosis not present

## 2021-12-01 DIAGNOSIS — K9401 Colostomy hemorrhage: Secondary | ICD-10-CM | POA: Diagnosis not present

## 2021-12-01 NOTE — Telephone Encounter (Signed)
Verbal orders given for PT to [facility].  ?

## 2021-12-04 ENCOUNTER — Telehealth: Payer: Self-pay | Admitting: Internal Medicine

## 2021-12-04 NOTE — Telephone Encounter (Signed)
[name] called to request verbals for pt for OT 1X week- 3 weeks for heart failure, COPD, exercise, ADL & transfers.  ? ?Please lvm if unavailable.  ?

## 2021-12-04 NOTE — Telephone Encounter (Signed)
Verbal orders for OT has been given to [facility]. ?

## 2021-12-05 ENCOUNTER — Ambulatory Visit: Payer: Medicare Other | Admitting: Nurse Practitioner

## 2021-12-05 ENCOUNTER — Encounter: Payer: Self-pay | Admitting: Nurse Practitioner

## 2021-12-05 ENCOUNTER — Telehealth: Payer: Self-pay

## 2021-12-05 VITALS — BP 124/62 | HR 68 | Ht 60.0 in | Wt 162.8 lb

## 2021-12-05 DIAGNOSIS — K6381 Dieulafoy lesion of intestine: Secondary | ICD-10-CM

## 2021-12-05 DIAGNOSIS — K5520 Angiodysplasia of colon without hemorrhage: Secondary | ICD-10-CM | POA: Diagnosis not present

## 2021-12-05 NOTE — Progress Notes (Signed)
? ? ?Chronic Care Management ?Pharmacy Assistant  ? ?Name: [name]  MRN: [id number] DOB: [birth year] ? ?Reason for Encounter: Disease State-Adherence  ?  ? ?Recent office visits:  ?None since the last coordination call on 10/18/21 ? ?Recent consult visits:  ?10/24/21 Barrett, Lodema Hong, PA-C-Vascular Surgery (Recurrent pleural effusion) No orders; Medicaton changes:nystatin (MYCOSTATIN/NYSTOP) powder ? ?Hospital visits:  ? ?None since the last coordination call on 10/18/21 ?Medications: ?Outpatient Encounter Medications as of 12/05/2021  ?Medication Sig Note  ? acetaminophen (TYLENOL) 325 MG tablet Take 325-650 mg by mouth daily as needed for moderate pain, fever or headache.    ? albuterol (PROAIR HFA) 108 (90 Base) MCG/ACT inhaler Inhale 1-2 puffs into the lungs every 6 (six) hours as needed for wheezing or shortness of breath.   ? allopurinol (ZYLOPRIM) 100 MG tablet TAKE 1 TABLET(100 MG) BY MOUTH DAILY (Patient taking differently: Take 100 mg by mouth daily.)   ? clonazePAM (KLONOPIN) 1 MG tablet Take 1 tablet (1 mg total) by mouth 2 (two) times daily as needed for anxiety. (Patient taking differently: Take 0.5-1 mg by mouth 2 (two) times daily as needed for anxiety.)   ? ferrous sulfate 325 (65 FE) MG tablet Take 1 tablet (325 mg total) by mouth 2 (two) times daily with a meal.   ? fluticasone furoate-vilanterol (BREO ELLIPTA) 100-25 MCG/ACT AEPB Inhale 1 puff into the lungs daily.   ? isosorbide-hydrALAZINE (BIDIL) 20-37.5 MG tablet TAKE 1 TABLET BY MOUTH THREE TIMES DAILY (Patient taking differently: Take 1 tablet by mouth in the morning and at bedtime.)   ? metoprolol tartrate (LOPRESSOR) 25 MG tablet TAKE 1 TABLET(25 MG) BY MOUTH TWICE DAILY   ? morphine (MS CONTIN) 15 MG 12 hr tablet Take 1 tablet (15 mg total) by mouth 2 (two) times daily as needed for pain. (Patient not taking: Reported on 11/22/2021) 10/09/2021: The patient takes this rarely (her words)  ? Nutritional Supplements (CARNATION BREAKFAST  ESSENTIALS) LIQD Take 237 mLs by mouth daily.   ? nystatin (MYCOSTATIN/NYSTOP) powder Apply 1 application. topically 3 (three) times daily.   ? osimertinib mesylate (TAGRISSO) 80 MG tablet Take 1 tablet (80 mg total) by mouth daily.   ? OXYGEN Inhale 2 L/min into the lungs continuous.   ? prochlorperazine (COMPAZINE) 10 MG tablet Take 1 tablet (10 mg total) by mouth every 6 (six) hours as needed. (Patient taking differently: Take 10 mg by mouth every 6 (six) hours as needed for nausea or vomiting.)   ? protein supplement shake (PREMIER PROTEIN) LIQD Take 11 oz by mouth 2 (two) times daily between meals.   ? torsemide (DEMADEX) 20 MG tablet TAKE 2 TABLETS(40 MG) BY MOUTH DAILY   ? ?Facility-Administered Encounter Medications as of 12/05/2021  ?Medication  ? cyanocobalamin ((VITAMIN B-12)) injection 1,000 mcg  ? ?[place] for General Review Call ? ? ?Chart Review: ? ?Have there been any documented new, changed, or discontinued medications since last visit? No (If yes, include name, dose, frequency, date) ?Has there been any documented recent hospitalizations or ED visits since last visit with Clinical Pharmacist? No ?Brief Summary (including medication and/or Diagnosis changes): ? ? ?Adherence Review: ? ?Does the Clinical Pharmacist Assistant have access to adherence rates? Yes ?Adherence rates for STAR metric medications (List medication(s)/day supply/ last 2 fill dates). ?Adherence rates for medications indicated for disease state being reviewed (List medication(s)/day supply/ last 2 fill dates). ?Does the patient have >5 day gap between last estimated fill dates for  any of the above medications or other medication gaps? No ?Reason for medication gaps. ? ? ?Disease State Questions: ? ?Able to connect with Patient? Yes ?Did patient have any problems with their health recently? No ?Note problems and Concerns: ?Have you had any admissions or emergency room visits or worsening of your condition(s) since  last visit? No ?Details of ED visit, hospital visit and/or worsening condition(s): ?Have you had any visits with new specialists or providers since your last visit? No ?Explain: ?Have you had any new health care problem(s) since your last visit? No ?New problem(s) reported: ?Have you run out of any of your medications since you last spoke with clinical pharmacist? No ?What caused you to run out of your medications? ?Are there any medications you are not taking as prescribed? No ?What kept you from taking your medications as prescribed? ?Are you having any issues or side effects with your medications? No ?Note of issues or side effects: ?Do you have any other health concerns or questions you want to discuss with your Clinical Pharmacist before your next visit? No ?Note additional concerns and questions from Patient. ?Are there any health concerns that you feel we can do a better job addressing? No ?Note Patient's response. ?Are you having any problems with any of the following since the last visit: (select all that apply) ? Other ? Details:Patient states just the normal problems with getting older ?12. Any falls since last visit? No ? Details: ?13. Any increased or uncontrolled pain since last visit? No ? Details: ?14. Next visit Type: telephone ?      Visit with:Clinical Pharmacist ?       Date:02/26/22  ?       Time:1pm ? ?15. Additional Details? No   ? ?Care Gaps: ?Colonoscopy-NA ?Diabetic Foot Exam-11/05/17 ?Mammogram-NA ?Ophthalmology-09/23/18 ?Dexa Scan - NA ?Annual Well Visit - NA ?Micro albumin-11/05/17 ?Hemoglobin A1c- 11/05/17 ?  ?Star Rating Drugs: ?None ID ?  ?[name] ?Clinical Pharmacist Assistant ?[phone]  ?

## 2021-12-05 NOTE — Progress Notes (Signed)
? ? ?Assessment  ? ?Patient Profile:  ?[name] is a 81 y.o. female known to Dr. Fuller Plan. She has multiple medical co-morbidities including a history of adenomatous colon polyps, ulcerative colitis which has been inactive, diverticulosis , umbilical hernia causing severe extrinsic stenosis of transverse colon for which she underwent exploratory lap resection of transverse colon / partial colectomy and end colostomy in January 2022, history of GI bleeding felt to be secondary to known small bowel AMVs, CKD stage III, stage IV non-small cell lung cancer/adenocarcinoma with previously documented widespread metastatic disease and chronic left pleural effusion for which she has required Pleurx catheter, MGUS,  CHF, COPD on chronic oxygen. Additional medical history as listed in [place] . ? ?Recent GI bleed secondary to bleeding cecal AVMs ( s/p APC) and ascending colon Dieulafoy lesion ( s/p clipping).   ?No further bleeding.  ? ?Acute on chronic anemia secondary to GI bleed.  ?Hgb improved from 5.1 to 8.4 post after several units of PRBC. She sees PCP tomorrow and expecting to have labs done.  ?  ?Occasional pyrosis.  ?Takes Pepcid ac which works well. In fact she feels it works better than PPI ? ?History of partial colectomy / end colostomy for umbilical hernia incarceration of transverse colon She has a recurrent abdominal hernia.  ? ?MGUS followed by Hematology ? ? ?Plan  ? ?Continue Pepcid ac as needed. Let us know if requiring it more than a once or twice a week as I can send in a prescription.  ?Discussed anti-reflux measures such as avoidance of late meals / bedtime snacks, HOB elevation and avoidance of trigger foods and caffeine.  ?Call for recurrent GI bleeding (ostomy bag) ? ? ?HPI  ? ?Chief Complaint : hospital follow up  ? ?[name] has chronic blood loss through her ostomy.  In fact, we saw her during a mid March 2023 hospitalization for bleeding.  Endoscopic work-up was not pursued at the time.  She does  have a history of small bowel AVMs which were last treated at the time of EGD and colonoscopy in October 2021.  ? ?Fergie was seen for hospital consultation 11/22/2021.  She had been admitted for worsening anemia / bleeding into ostomy bag.  She is followed by hematology for MGUS. She had received 6 units of blood over the prior 6 weeks and in ED her hgb was 5.1, down from 8.1 less than two weeks prior. She received an additional 4 u of PRBC and by time of discharge her hgb was 8.4.  ? ?Interval History ?Here for hospital follow up . No GI further bleeding/ Her appetite isn't great. She is taking oral iron BID.  ? ?Previous GI Evaluation  ? ? ?July 2016 EGD and colonoscopy for GI bleed ?1.Four angiodysplastic lesions at the cecum ?2. Sessile polyp in the ascending colon; polypectomy performed with a cold snare ?3. Mild diverticulosis in the sigmoid colon ?4. Grade l internal hemorrhoids ?Polyp path = tubular adenoma ?  ?EGD ?ENDOSCOPIC IMPRESSION: ?1. 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 Small hiatal hernia ?  ?  ?Jan 2020 EGD and colonoscopy for GI bleed / anemia ?-The examined esophagus was normal. ?-A single 2 mm no bleeding angiodysplastic lesion was found in the gastric antrum. Coagulation for bleeding prevention using argon plasma was successful. The exam of the stomach was otherwise normal. ?A single 3 mm angiodysplastic lesion without bleeding was found in the duodenal bulb. Coagulation for bleeding prevention using argon plasma was  successful. The second portion of the duodenum and third portion of the duodenum were normal. ?  ?-Stenosis in the transverse colon at the periumbilical hernia. ?- One 7 mm polyp in the transverse colon. Not removed. ?- A single bleeding transverse colon angiodysplastic lesion. Treated with argon plasma coagulation (APC). ?- The examination was otherwise normal on direct and retroflexion views. ?- Diverticulosis in the left colon. ?- No specimens  collected ?  ?Oct 2021 EGD ?- Normal esophagus ?- Normal stomach. ?- Three non-bleeding angiodysplastic lesions in the duodenum. Treated with argon plasma coagulation (APC). ?- Three non-bleeding angiodysplastic lesions in the duodenum. Treated with argon plasma coagulation (APC). ?  ?Nov 2021 colonoscopy for GI bleed and anemia ?-Incomplete colonoscopy to the transverse colon. ?- Moderate diverticulosis in the left colon. There was no evidence of diverticular bleeding. ?- Severe stenosis in the transverse colon at the nonreducible periumblical hernia. Unable to safely travserse. ?- Segmental erythematous, friable, granular mucosa in the transverse colon at the periumbical hernia. ?- Internal hemorrhoids. ?- The examination was otherwise normal on direct and retroflexion views. ?- No specimens collected. ?  ?11/24/19 EGD and colonoscopy for bleeding into ostomy ?- Z-line regular, 35 cm from the incisors. ?- No gross lesions in esophagus. ?- Small hiatal hernia. ?- Normal examined duodenum. ?- No specimens collected ? ?- Three bleeding colonic angioectasias in cecum. Treated with argon plasma coagulation (APC). ?- Blood in the ascending colon, active bleeding Dieulofoy's lesion. Clips (MR conditional) were placed. ?- No specimens collected ? ? ?Labs:  ? ?  Latest Ref Rng & Units 11/26/2021  ?  3:19 AM 11/25/2021  ?  7:49 PM 11/25/2021  ?  7:26 AM  ?CBC  ?WBC 4.0 - 10.5 K/uL 7.7    6.9    ?Hemoglobin 12.0 - 15.0 g/dL 8.4   8.6   7.6    ?Hematocrit 36.0 - 46.0 % 26.6   27.0   24.4    ?Platelets 150 - 400 K/uL 103    106    ? ? ? ?  Latest Ref Rng & Units 11/26/2021  ?  3:19 AM 11/25/2021  ?  7:26 AM 11/24/2021  ?  4:22 AM  ?Hepatic Function  ?Albumin 3.5 - 5.0 g/dL 2.5   2.5   2.6    ? ? ? ?Past Medical History:  ?Diagnosis Date  ? Allergy   ? Angiodysplasia of cecum   ? Angiodysplasia of duodenum   ? Arthritis   ? Asteatotic eczema 02/25/2016  ? Asthma   ? Atrial fibrillation ([place]) 10/17/2017  ? Blood transfusion without  reported diagnosis   ? Cancer [facility])   ? Cataract   ? CHF (congestive heart failure), NYHA class III, chronic, combined ([place]) 08/10/2017  ? Colonic polyp 02/16/2008  ? Tubular adenoma  ? COPD (chronic obstructive pulmonary disease) ([place]) 01/27/1998  ? Cor pulmonale ([place]) 11/16/2014  ? GERD 07/30/1992  ? Gout   ? HTN (hypertension) 07/30/1988  ? Hyperlipidemia with target LDL less than 100 11/27/1996  ?     ? Kidney stone 1960, 1972, 1991  ? Medical history non-contributory   ? MGUS (monoclonal gammopathy of unknown significance) 11/08/2016  ? Morbid obesity ([place]) 04/19/2010  ? She agrees to work on her lifestyle modifications to help her lose weight.   ? OSA (obstructive sleep apnea) 06/22/2013  ? Osteopenia, senile 02/05/2013  ? July 2014  -2.1 left femur -1.9 left forearm   ? Prediabetes 11/13/2006  ? Renal insufficiency   ?  Stenosis of cervical spine with myelopathy ([place]) 01/01/2018  ? Supplemental oxygen dependent   ? 2L via [place]  ? Ulcer of leg, chronic, right ([place]) 10/10/2011  ? ULCERATIVE COLITIS-LEFT SIDE 03/19/2008  ?     ? Vitamin B12 deficiency anemia 11/13/2006  ?     ? Vitamin D deficiency 02/06/2013  ? ? ?Past Surgical History:  ?Procedure Laterality Date  ? BRONCHOSCOPY    ? CHEST TUBE INSERTION Left 06/15/2021  ? Procedure: INSERTION PLEURAL DRAINAGE CATHETER;  Surgeon: [name], MD;  Location: [facility];  Service: Thoracic;  Laterality: Left;  ? COLONOSCOPY    ? COLONOSCOPY WITH PROPOFOL N/A 02/23/2015  ? Procedure: COLONOSCOPY WITH PROPOFOL;  Surgeon: Ladene Artist, MD;  Location: [facility];  Service: Endoscopy;  Laterality: N/A;  ? COLONOSCOPY WITH PROPOFOL N/A 08/12/2018  ? Procedure: COLONOSCOPY WITH PROPOFOL;  Surgeon: Ladene Artist, MD;  Location: [facility];  Service: Endoscopy;  Laterality: N/A;  ? COLONOSCOPY WITH PROPOFOL N/A 05/09/2020  ? Procedure: COLONOSCOPY WITH PROPOFOL;  Surgeon: Ladene Artist, MD;  Location: [facility];  Service: Endoscopy;  Laterality: N/A;  To  Splenic Flexture  ? COLONOSCOPY WITH PROPOFOL N/A 11/23/2021  ? Procedure: COLONOSCOPY WITH PROPOFOL;  Surgeon: Mauri Pole, MD;  Location: [facility];  Service: Gastroenterology;  Laterality: N/A;  ? E

## 2021-12-05 NOTE — Patient Instructions (Addendum)
If you are age 82 or older, your body mass index should be between 23-30. Your Body mass index is 31.79 kg/m?Marland Kitchen If this is out of the aforementioned range listed, please consider follow up with your Primary Care Provider. ?________________________________________________________ ? ?The [facility] providers would like to encourage you to use [facility] to communicate with providers for non-urgent requests or questions.  Due to long hold times on the telephone, sending your provider a message by [facility] may be a faster and more efficient way to get a response.  Please allow 48 business hours for a response.  Please remember that this is for non-urgent requests.  ?_______________________________________________________ ? ? ?Continue Pepcid ac as needed. Let us know if requiring it more than a once or twice a week as I can send in a prescription.  ?Discussed anti-reflux measures such as avoidance of late meals / bedtime snacks, head of bed elevation, and avoidance of trigger foods and caffeine.  ?Call for recurrent GI bleeding (ostomy bag) ? ?

## 2021-12-06 ENCOUNTER — Ambulatory Visit (INDEPENDENT_AMBULATORY_CARE_PROVIDER_SITE_OTHER): Payer: Medicare Other | Admitting: Internal Medicine

## 2021-12-06 ENCOUNTER — Encounter: Payer: Self-pay | Admitting: Internal Medicine

## 2021-12-06 VITALS — BP 158/82 | HR 70 | Temp 97.8°F | Ht 60.0 in | Wt 162.0 lb

## 2021-12-06 DIAGNOSIS — L89312 Pressure ulcer of right buttock, stage 2: Secondary | ICD-10-CM | POA: Diagnosis not present

## 2021-12-06 DIAGNOSIS — D510 Vitamin B12 deficiency anemia due to intrinsic factor deficiency: Secondary | ICD-10-CM

## 2021-12-06 DIAGNOSIS — I5032 Chronic diastolic (congestive) heart failure: Secondary | ICD-10-CM | POA: Diagnosis not present

## 2021-12-06 DIAGNOSIS — I10 Essential (primary) hypertension: Secondary | ICD-10-CM | POA: Diagnosis not present

## 2021-12-06 DIAGNOSIS — K9401 Colostomy hemorrhage: Secondary | ICD-10-CM | POA: Diagnosis not present

## 2021-12-06 DIAGNOSIS — L602 Onychogryphosis: Secondary | ICD-10-CM | POA: Diagnosis not present

## 2021-12-06 DIAGNOSIS — I130 Hypertensive heart and chronic kidney disease with heart failure and stage 1 through stage 4 chronic kidney disease, or unspecified chronic kidney disease: Secondary | ICD-10-CM | POA: Diagnosis not present

## 2021-12-06 DIAGNOSIS — E538 Deficiency of other specified B group vitamins: Secondary | ICD-10-CM | POA: Diagnosis not present

## 2021-12-06 DIAGNOSIS — Q2733 Arteriovenous malformation of digestive system vessel: Secondary | ICD-10-CM | POA: Diagnosis not present

## 2021-12-06 DIAGNOSIS — D62 Acute posthemorrhagic anemia: Secondary | ICD-10-CM | POA: Diagnosis not present

## 2021-12-06 MED ORDER — CYANOCOBALAMIN 1000 MCG/ML IJ SOLN: 1000.0000 ug | Freq: Once | INTRAMUSCULAR | Status: DC

## 2021-12-06 MED ORDER — CYANOCOBALAMIN 2000 MCG PO TABS: 2000.0000 ug | ORAL_TABLET | Freq: Every day | ORAL | 3 refills | Status: AC

## 2021-12-06 NOTE — Progress Notes (Signed)
? ? ? ? ?Subjective:  ?Patient ID: [name], female    DOB: 1941/01/02  Age: 81 y.o. MRN: [id number] ? ?CC: Anemia ? ? ?HPI ?[name] presents for f/up - ? ?She is stable since discharge. ? ?PCP: [name], MD ?  ?Admit date: 11/22/2021 ?Discharge date: 11/26/2021 ?Admitted From: Home ?Disposition: Home ?Recommendations for Outpatient Follow-up:  ?Follow-up with PCP in 1 to 2 weeks. ?Please obtain CBC/BMP/Mag at follow up ?Please follow up on the following pending results: None ?  ?Home Health: PT/OT/RN/aide ?Equipment/Devices: Patient has home oxygen, rolling walker and bedside commode ?  ?Discharge Condition: Stable but guarded prognosis ?CODE STATUS: Partial code-everything except intubation and mechanical ventilation. ?  Follow-up Information   ?  ? ? ?Outpatient Medications Prior to Visit  ?Medication Sig Dispense Refill  ? acetaminophen (TYLENOL) 325 MG tablet Take 325-650 mg by mouth daily as needed for moderate pain, fever or headache.     ? albuterol (PROAIR HFA) 108 (90 Base) MCG/ACT inhaler Inhale 1-2 puffs into the lungs every 6 (six) hours as needed for wheezing or shortness of breath. 18 g 11  ? allopurinol (ZYLOPRIM) 100 MG tablet TAKE 1 TABLET(100 MG) BY MOUTH DAILY (Patient taking differently: Take 100 mg by mouth daily.) 90 tablet 1  ? clonazePAM (KLONOPIN) 1 MG tablet Take 1 tablet (1 mg total) by mouth 2 (two) times daily as needed for anxiety. (Patient taking differently: Take 0.5-1 mg by mouth 2 (two) times daily as needed for anxiety.) 180 tablet 0  ? ferrous sulfate 325 (65 FE) MG tablet Take 1 tablet (325 mg total) by mouth 2 (two) times daily with a meal. 180 tablet 1  ? fluticasone furoate-vilanterol (BREO ELLIPTA) 100-25 MCG/ACT AEPB Inhale 1 puff into the lungs daily. 120 each 1  ? isosorbide-hydrALAZINE (BIDIL) 20-37.5 MG tablet TAKE 1 TABLET BY MOUTH THREE TIMES DAILY (Patient taking differently: Take 1 tablet by mouth in the morning and at bedtime.) 270 tablet 0  ?  metoprolol tartrate (LOPRESSOR) 25 MG tablet TAKE 1 TABLET(25 MG) BY MOUTH TWICE DAILY 180 tablet 1  ? morphine (MS CONTIN) 15 MG 12 hr tablet Take 1 tablet (15 mg total) by mouth 2 (two) times daily as needed for pain. 60 tablet 0  ? Nutritional Supplements (CARNATION BREAKFAST ESSENTIALS) LIQD Take 237 mLs by mouth daily.    ? nystatin (MYCOSTATIN/NYSTOP) powder Apply 1 application. topically 3 (three) times daily. 60 g 0  ? osimertinib mesylate (TAGRISSO) 80 MG tablet Take 1 tablet (80 mg total) by mouth daily. 30 tablet 3  ? OXYGEN Inhale 2 L/min into the lungs continuous.    ? prochlorperazine (COMPAZINE) 10 MG tablet Take 1 tablet (10 mg total) by mouth every 6 (six) hours as needed. (Patient taking differently: Take 10 mg by mouth every 6 (six) hours as needed for nausea or vomiting.) 30 tablet 2  ? protein supplement shake (PREMIER PROTEIN) LIQD Take 11 oz by mouth 2 (two) times daily between meals.    ? torsemide (DEMADEX) 20 MG tablet TAKE 2 TABLETS(40 MG) BY MOUTH DAILY 180 tablet 1  ? ?Facility-Administered Medications Prior to Visit  ?Medication Dose Route Frequency Provider Last Rate Last Admin  ? cyanocobalamin ((VITAMIN B-12)) injection 1,000 mcg  1,000 mcg Intramuscular Q30 days [name], MD   1,000 mcg at 12/06/21 1143  ? ? ?ROS ?Review of Systems  ?Constitutional:  Positive for appetite change (poor appetite), fatigue and unexpected weight change (wt loss).  Negative for chills and diaphoresis.  ?HENT: Negative.    ?Eyes: Negative.   ?Respiratory:  Positive for shortness of breath. Negative for cough, chest tightness and wheezing.   ?Cardiovascular:  Negative for chest pain, palpitations and leg swelling.  ?Gastrointestinal:  Negative for abdominal pain, blood in stool, constipation, diarrhea, nausea and vomiting.  ?Endocrine: Negative.   ?Genitourinary: Negative.  Negative for difficulty urinating, dysuria and urgency.  ?Musculoskeletal: Negative.   ?Skin: Negative.   ?Neurological:   Positive for weakness. Negative for dizziness, light-headedness and numbness.  ?Hematological:  Negative for adenopathy. Does not bruise/bleed easily.  ?Psychiatric/Behavioral: Negative.    ? ?Objective:  ?BP (!) 158/82 (BP Location: Left Arm, Patient Position: Sitting, Cuff Size: Large)   Pulse 70   Temp 97.8 ?F (36.6 ?C) (Oral)   Ht 5' (1.524 m)   Wt 162 lb (73.5 kg)   SpO2 94%   BMI 31.64 kg/m?  ? ?BP Readings from Last 3 Encounters:  ?12/06/21 (!) 158/82  ?12/05/21 124/62  ?11/26/21 (!) 151/43  ? ? ?Wt Readings from Last 3 Encounters:  ?12/06/21 162 lb (73.5 kg)  ?12/05/21 162 lb 12.8 oz (73.8 kg)  ?11/26/21 173 lb 5.6 oz (78.6 kg)  ? ? ?Physical Exam ?Vitals reviewed.  ?Constitutional:   ?   General: She is not in acute distress. ?   Appearance: She is ill-appearing. She is not toxic-appearing or diaphoretic.  ?HENT:  ?   Nose: Nose normal.  ?   Mouth/Throat:  ?   Mouth: Mucous membranes are moist.  ?Eyes:  ?   General: No scleral icterus. ?   Conjunctiva/sclera: Conjunctivae normal.  ?Cardiovascular:  ?   Rate and Rhythm: Normal rate and regular rhythm.  ?   Heart sounds: Murmur heard.  ?Systolic murmur is present with a grade of 1/6.  ?No diastolic murmur is present.  ?  No friction rub. No gallop.  ?Pulmonary:  ?   Effort: Pulmonary effort is normal.  ?   Breath sounds: No stridor. No wheezing, rhonchi or rales.  ?Abdominal:  ?   General: Abdomen is flat.  ?   Palpations: There is no mass.  ?   Tenderness: There is no abdominal tenderness. There is no guarding.  ?Musculoskeletal:  ?   Cervical back: Neck supple.  ?   Right lower leg: No edema.  ?   Left lower leg: No edema.  ?Lymphadenopathy:  ?   Cervical: No cervical adenopathy.  ?Skin: ?   General: Skin is warm and dry.  ?Neurological:  ?   General: No focal deficit present.  ?   Mental Status: She is alert.  ? ? ?Lab Results  ?Component Value Date  ? WBC 7.7 11/26/2021  ? HGB 8.4 (L) 11/26/2021  ? HCT 26.6 (L) 11/26/2021  ? PLT 103 (L) 11/26/2021   ? GLUCOSE 98 11/26/2021  ? CHOL 165 02/04/2020  ? TRIG 162 (H) 02/04/2020  ? HDL 49 (L) 02/04/2020  ? LDLDIRECT 107.0 09/04/2016  ? [place] 90 02/04/2020  ? ALT 6 11/23/2021  ? AST 19 11/23/2021  ? NA 140 11/26/2021  ? K 4.4 11/26/2021  ? CL 109 11/26/2021  ? CREATININE 1.64 (H) 11/26/2021  ? BUN 34 (H) 11/26/2021  ? CO2 26 11/26/2021  ? TSH 2.35 07/02/2019  ? INR 1.1 11/23/2021  ? HGBA1C 5.4 11/05/2017  ? MICROALBUR <0.7 11/05/2017  ? ? ?CT ABDOMEN PELVIS WO CONTRAST ? ?Result Date: 11/22/2021 ?CLINICAL DATA:  Blood in ostomy bag for  several weeks. History of stage IV non small cell lung cancer. EXAM: CT ABDOMEN AND PELVIS WITHOUT CONTRAST TECHNIQUE: Multidetector CT imaging of the abdomen and pelvis was performed following the standard protocol without IV contrast. RADIATION DOSE REDUCTION: This exam was performed according to the departmental dose-optimization program which includes automated exposure control, adjustment of the mA and/or kV according to patient size and/or use of iterative reconstruction technique. COMPARISON:  CT 11/10/2014 chest, CT abdomen pelvis 10/10/2021 FINDINGS: Lower chest: LEFT basilar effusion and atelectasis not changed from comparison exam. Hepatobiliary: No focal hepatic lesion on noncontrast exam. Pancreas: Pancreas is normal. No ductal dilatation. No pancreatic inflammation. Spleen: Normal spleen Adrenals/urinary tract: Adrenal glands normal. There multiple mixed density lesions LEFT RIGHT kidney not changed from prior. No obstructive uropathy. Ureters and bladder normal Stomach/Bowel: Stomach duodenum normal. Small bowel is normal caliber. No bowel obstruction. RIGHT colon is normal. Midline LEFT colostomy with transverse colon end colostomy. Ventral abdominal wall hernia RIGHT of midline contains loop of nonobstructed small bowel and colon. No inflammation or obstruction of the colon or small bowel demonstrated. Peristomal hernia contains fat and colon. Residual stool within the  rectum. Vascular/Lymphatic: Abdominal aorta is normal caliber with atherosclerotic calcification. There is no retroperitoneal or periportal lymphadenopathy. No pelvic lymphadenopathy. Reproductive: Uterus and adne

## 2021-12-06 NOTE — Patient Instructions (Signed)
Anemia ? ?Anemia is a condition in which there is not enough red blood cells or hemoglobin in the blood. Hemoglobin is a substance in red blood cells that carries oxygen. ?When you do not have enough red blood cells or hemoglobin (are anemic), your body cannot get enough oxygen and your organs may not work properly. As a result, you may feel very tired or have other problems. ?What are the causes? ?Common causes of anemia include: ?Excessive bleeding. Anemia can be caused by excessive bleeding inside or outside the body, including bleeding from the intestines or from heavy menstrual periods in females. ?Poor nutrition. ?Long-lasting (chronic) kidney, thyroid, and liver disease. ?Bone marrow disorders, spleen problems, and blood disorders. ?Cancer and treatments for cancer. ?HIV (human immunodeficiency virus) and AIDS (acquired immunodeficiency syndrome). ?Infections, medicines, and autoimmune disorders that destroy red blood cells. ?What are the signs or symptoms? ?Symptoms of this condition include: ?Minor weakness. ?Dizziness. ?Headache, or difficulties concentrating and sleeping. ?Heartbeats that feel irregular or faster than normal (palpitations). ?Shortness of breath, especially with exercise. ?Pale skin, lips, and nails, or cold hands and feet. ?Indigestion and nausea. ?Symptoms may occur suddenly or develop slowly. If your anemia is mild, you may not have symptoms. ?How is this diagnosed? ?This condition is diagnosed based on blood tests, your medical history, and a physical exam. In some cases, a test may be needed in which cells are removed from the soft tissue inside of a bone and looked at under a microscope (bone marrow biopsy). Your health care provider may also check your stool (feces) for blood and may do additional testing to look for the cause of your bleeding. ?Other tests may include: ?Imaging tests, such as a CT scan or MRI. ?A procedure to see inside your esophagus and stomach (endoscopy). ?A  procedure to see inside your colon and rectum (colonoscopy). ?How is this treated? ?Treatment for this condition depends on the cause. If you continue to lose a lot of blood, you may need to be treated at a hospital. Treatment may include: ?Taking supplements of iron, vitamin Z30, or folic acid. ?Taking a hormone medicine (erythropoietin) that can help to stimulate red blood cell growth. ?Having a blood transfusion. This may be needed if you lose a lot of blood. ?Making changes to your diet. ?Having surgery to remove your spleen. ?Follow these instructions at home: ?Take over-the-counter and prescription medicines only as told by your health care provider. ?Take supplements only as told by your health care provider. ?Follow any diet instructions that you were given by your health care provider. ?Keep all follow-up visits as told by your health care provider. This is important. ?Contact a health care provider if: ?You develop new bleeding anywhere in the body. ?Get help right away if: ?You are very weak. ?You are short of breath. ?You have pain in your abdomen or chest. ?You are dizzy or feel faint. ?You have trouble concentrating. ?You have bloody stools, black stools, or tarry stools. ?You vomit repeatedly or you vomit up blood. ?These symptoms may represent a serious problem that is an emergency. Do not wait to see if the symptoms will go away. Get medical help right away. Call your local emergency services (911 in the [place] Do not drive yourself to the hospital. ?Summary ?Anemia is a condition in which you do not have enough red blood cells or enough of a substance in your red blood cells that carries oxygen (hemoglobin). ?Symptoms may occur suddenly or develop slowly. ?If your anemia  is mild, you may not have symptoms. ?This condition is diagnosed with blood tests, a medical history, and a physical exam. Other tests may be needed. ?Treatment for this condition depends on the cause of the anemia. ?This  information is not intended to replace advice given to you by your health care provider. Make sure you discuss any questions you have with your health care provider. ?Document Revised: 05/30/2021 Document Reviewed: 06/23/2019 ?Elsevier Patient Education ? [place]. ? ?

## 2021-12-07 DIAGNOSIS — K9401 Colostomy hemorrhage: Secondary | ICD-10-CM | POA: Diagnosis not present

## 2021-12-07 DIAGNOSIS — L89312 Pressure ulcer of right buttock, stage 2: Secondary | ICD-10-CM | POA: Diagnosis not present

## 2021-12-07 DIAGNOSIS — I130 Hypertensive heart and chronic kidney disease with heart failure and stage 1 through stage 4 chronic kidney disease, or unspecified chronic kidney disease: Secondary | ICD-10-CM | POA: Diagnosis not present

## 2021-12-07 DIAGNOSIS — Q2733 Arteriovenous malformation of digestive system vessel: Secondary | ICD-10-CM | POA: Diagnosis not present

## 2021-12-07 DIAGNOSIS — D62 Acute posthemorrhagic anemia: Secondary | ICD-10-CM | POA: Diagnosis not present

## 2021-12-08 ENCOUNTER — Telehealth: Payer: Self-pay | Admitting: Medical Oncology

## 2021-12-08 ENCOUNTER — Telehealth: Payer: Self-pay | Admitting: Internal Medicine

## 2021-12-08 NOTE — Telephone Encounter (Signed)
.  Called patient to schedule appointment per 5/12 inbasket, patient is aware of date and time.   ?

## 2021-12-08 NOTE — Telephone Encounter (Signed)
Requests appt next week. ?Schedule message sent. ?

## 2021-12-11 ENCOUNTER — Inpatient Hospital Stay: Payer: Medicare Other

## 2021-12-11 ENCOUNTER — Other Ambulatory Visit: Payer: Self-pay

## 2021-12-11 ENCOUNTER — Inpatient Hospital Stay: Payer: Medicare Other | Attending: Internal Medicine | Admitting: Internal Medicine

## 2021-12-11 VITALS — BP 135/38 | HR 61 | Temp 97.0°F | Resp 17

## 2021-12-11 DIAGNOSIS — I110 Hypertensive heart disease with heart failure: Secondary | ICD-10-CM | POA: Insufficient documentation

## 2021-12-11 DIAGNOSIS — C3492 Malignant neoplasm of unspecified part of left bronchus or lung: Secondary | ICD-10-CM | POA: Insufficient documentation

## 2021-12-11 DIAGNOSIS — I5042 Chronic combined systolic (congestive) and diastolic (congestive) heart failure: Secondary | ICD-10-CM | POA: Insufficient documentation

## 2021-12-11 DIAGNOSIS — C778 Secondary and unspecified malignant neoplasm of lymph nodes of multiple regions: Secondary | ICD-10-CM | POA: Diagnosis not present

## 2021-12-11 DIAGNOSIS — D500 Iron deficiency anemia secondary to blood loss (chronic): Secondary | ICD-10-CM | POA: Diagnosis not present

## 2021-12-11 DIAGNOSIS — C782 Secondary malignant neoplasm of pleura: Secondary | ICD-10-CM | POA: Diagnosis not present

## 2021-12-11 DIAGNOSIS — C7951 Secondary malignant neoplasm of bone: Secondary | ICD-10-CM | POA: Insufficient documentation

## 2021-12-11 LAB — CBC WITH DIFFERENTIAL (CANCER CENTER ONLY)
Abs Immature Granulocytes: 0.02 10*3/uL (ref 0.00–0.07)
Basophils Absolute: 0 10*3/uL (ref 0.0–0.1)
Basophils Relative: 0 %
Eosinophils Absolute: 0.1 10*3/uL (ref 0.0–0.5)
Eosinophils Relative: 2 %
HCT: 27.6 % — ABNORMAL LOW (ref 36.0–46.0)
Hemoglobin: 8.7 g/dL — ABNORMAL LOW (ref 12.0–15.0)
Immature Granulocytes: 0 %
Lymphocytes Relative: 11 %
Lymphs Abs: 0.7 10*3/uL (ref 0.7–4.0)
MCH: 27.4 pg (ref 26.0–34.0)
MCHC: 31.5 g/dL (ref 30.0–36.0)
MCV: 86.8 fL (ref 80.0–100.0)
Monocytes Absolute: 0.5 10*3/uL (ref 0.1–1.0)
Monocytes Relative: 8 %
Neutro Abs: 5.3 10*3/uL (ref 1.7–7.7)
Neutrophils Relative %: 79 %
Platelet Count: 162 10*3/uL (ref 150–400)
RBC: 3.18 MIL/uL — ABNORMAL LOW (ref 3.87–5.11)
RDW: 14.2 % (ref 11.5–15.5)
WBC Count: 6.7 10*3/uL (ref 4.0–10.5)
nRBC: 0 % (ref 0.0–0.2)

## 2021-12-11 LAB — CMP (CANCER CENTER ONLY)
ALT: <5 U/L (ref 0–44)
AST: 12 U/L — ABNORMAL LOW (ref 15–41)
Albumin: 3.5 g/dL (ref 3.5–5.0)
Alkaline Phosphatase: 39 U/L (ref 38–126)
Anion gap: 5 (ref 5–15)
BUN: 48 mg/dL — ABNORMAL HIGH (ref 8–23)
CO2: 32 mmol/L (ref 22–32)
Calcium: 9.6 mg/dL (ref 8.9–10.3)
Chloride: 101 mmol/L (ref 98–111)
Creatinine: 1.8 mg/dL — ABNORMAL HIGH (ref 0.44–1.00)
GFR, Estimated: 28 mL/min — ABNORMAL LOW (ref >60)
Glucose, Bld: 119 mg/dL — ABNORMAL HIGH (ref 70–99)
Potassium: 4.2 mmol/L (ref 3.5–5.1)
Sodium: 138 mmol/L (ref 135–145)
Total Bilirubin: 0.3 mg/dL (ref 0.3–1.2)
Total Protein: 6.8 g/dL (ref 6.5–8.1)

## 2021-12-11 LAB — SAMPLE TO BLOOD BANK

## 2021-12-11 NOTE — Progress Notes (Signed)
?    [facility] ?Telephone:([phone]) [phone]   [phone] ? ?OFFICE PROGRESS NOTE ? ?Laurie Lima, MD ?[name] [address] ? ?DIAGNOSIS: Stage IV (Tx, N3, M1C) non-small cell lung cancer, adenocarcinoma. She presented with a left primary lung lesion and widespread metastatic disease in the thoracic nodes, left pleural space, abdomen pelvic lymph nodes, and bones.  She was diagnosed in October 2022. ? ?Molecular studies: The patient has positive EGFR mutation in exon 21 ([id number]) ?  ?PRIOR THERAPY:   ?  ?CURRENT THERAPY: Targeted treatment with Tagrisso 80 mg p.o. daily.  First dose started on June 21, 2021.  Status post 7 months. ? ?INTERVAL HISTORY: ?[name] 81 y.o. female returns to the clinic today for follow-up visit accompanied by her daughter.  The patient is feeling fine today with no concerning complaints except for mild fatigue and anxiety.  She denied having any current chest pain but has shortness of breath with exertion with no cough or hemoptysis.  She has no nausea, vomiting, diarrhea or constipation.  She has no headache or visual changes.  She denied having any significant weight loss or night sweats.  She was seen by her gastroenterologist and had EGD and colonoscopy on 11/23/2020.  The patient was found to have blood in the ascending colon with active bleeding status post clipping.  She is here today for evaluation and repeat blood work. ? ? ?MEDICAL HISTORY: ?Past Medical History:  ?Diagnosis Date  ? Allergy   ? Angiodysplasia of cecum   ? Angiodysplasia of duodenum   ? Arthritis   ? Asteatotic eczema 02/25/2016  ? Asthma   ? Atrial fibrillation ([place]) 10/17/2017  ? Blood transfusion without reported diagnosis   ? Cancer [facility])   ? Cataract   ? CHF (congestive heart failure), NYHA class III, chronic, combined ([place]) 08/10/2017  ? Colonic polyp 02/16/2008  ? Tubular adenoma  ? COPD (chronic obstructive pulmonary disease) ([place]) 01/27/1998  ? Cor  pulmonale ([place]) 11/16/2014  ? GERD 07/30/1992  ? Gout   ? HTN (hypertension) 07/30/1988  ? Hyperlipidemia with target LDL less than 100 11/27/1996  ?     ? Kidney stone 1960, 1972, 1991  ? Medical history non-contributory   ? MGUS (monoclonal gammopathy of unknown significance) 11/08/2016  ? Morbid obesity ([place]) 04/19/2010  ? She agrees to work on her lifestyle modifications to help her lose weight.   ? OSA (obstructive sleep apnea) 06/22/2013  ? Osteopenia, senile 02/05/2013  ? July 2014  -2.1 left femur -1.9 left forearm   ? Prediabetes 11/13/2006  ? Renal insufficiency   ? Stenosis of cervical spine with myelopathy ([place]) 01/01/2018  ? Supplemental oxygen dependent   ? 2L via [place]  ? Ulcer of leg, chronic, right ([place]) 10/10/2011  ? ULCERATIVE COLITIS-LEFT SIDE 03/19/2008  ?     ? Vitamin B12 deficiency anemia 11/13/2006  ?     ? Vitamin D deficiency 02/06/2013  ? ? ?ALLERGIES:  is allergic to celebrex [celecoxib], amlodipine besylate, enalapril, lipitor [atorvastatin], trelegy ellipta [fluticasone-umeclidin-vilant], amoxicillin, codeine sulfate, hydrocodone-acetaminophen, and tape. ? ?MEDICATIONS:  ?Current Outpatient Medications  ?Medication Sig Dispense Refill  ? acetaminophen (TYLENOL) 325 MG tablet Take 325-650 mg by mouth daily as needed for moderate pain, fever or headache.     ? albuterol (PROAIR HFA) 108 (90 Base) MCG/ACT inhaler Inhale 1-2 puffs into the lungs every 6 (six) hours as needed for wheezing or shortness of breath. 18 g  11  ? allopurinol (ZYLOPRIM) 100 MG tablet TAKE 1 TABLET(100 MG) BY MOUTH DAILY (Patient taking differently: Take 100 mg by mouth daily.) 90 tablet 1  ? clonazePAM (KLONOPIN) 1 MG tablet Take 1 tablet (1 mg total) by mouth 2 (two) times daily as needed for anxiety. (Patient taking differently: Take 0.5-1 mg by mouth 2 (two) times daily as needed for anxiety.) 180 tablet 0  ? cyanocobalamin 2000 MCG tablet Take 1 tablet (2,000 mcg total) by mouth daily. 90 tablet 3  ? ferrous  sulfate 325 (65 FE) MG tablet Take 1 tablet (325 mg total) by mouth 2 (two) times daily with a meal. 180 tablet 1  ? fluticasone furoate-vilanterol (BREO ELLIPTA) 100-25 MCG/ACT AEPB Inhale 1 puff into the lungs daily. 120 each 1  ? isosorbide-hydrALAZINE (BIDIL) 20-37.5 MG tablet TAKE 1 TABLET BY MOUTH THREE TIMES DAILY (Patient taking differently: Take 1 tablet by mouth in the morning and at bedtime.) 270 tablet 0  ? metoprolol tartrate (LOPRESSOR) 25 MG tablet TAKE 1 TABLET(25 MG) BY MOUTH TWICE DAILY 180 tablet 1  ? morphine (MS CONTIN) 15 MG 12 hr tablet Take 1 tablet (15 mg total) by mouth 2 (two) times daily as needed for pain. 60 tablet 0  ? Nutritional Supplements (CARNATION BREAKFAST ESSENTIALS) LIQD Take 237 mLs by mouth daily.    ? nystatin (MYCOSTATIN/NYSTOP) powder Apply 1 application. topically 3 (three) times daily. 60 g 0  ? osimertinib mesylate (TAGRISSO) 80 MG tablet Take 1 tablet (80 mg total) by mouth daily. 30 tablet 3  ? OXYGEN Inhale 2 L/min into the lungs continuous.    ? prochlorperazine (COMPAZINE) 10 MG tablet Take 1 tablet (10 mg total) by mouth every 6 (six) hours as needed. (Patient taking differently: Take 10 mg by mouth every 6 (six) hours as needed for nausea or vomiting.) 30 tablet 2  ? protein supplement shake (PREMIER PROTEIN) LIQD Take 11 oz by mouth 2 (two) times daily between meals.    ? torsemide (DEMADEX) 20 MG tablet TAKE 2 TABLETS(40 MG) BY MOUTH DAILY 180 tablet 1  ? ?Current Facility-Administered Medications  ?Medication Dose Route Frequency Provider Last Rate Last Admin  ? cyanocobalamin ((VITAMIN B-12)) injection 1,000 mcg  1,000 mcg Intramuscular Q30 days Laurie Lima, MD   1,000 mcg at 12/06/21 1143  ? ? ?SURGICAL HISTORY:  ?Past Surgical History:  ?Procedure Laterality Date  ? BRONCHOSCOPY    ? CHEST TUBE INSERTION Left 06/15/2021  ? Procedure: INSERTION PLEURAL DRAINAGE CATHETER;  Surgeon: [name], MD;  Location: [facility];  Service: Thoracic;   Laterality: Left;  ? COLONOSCOPY    ? COLONOSCOPY WITH PROPOFOL N/A 02/23/2015  ? Procedure: COLONOSCOPY WITH PROPOFOL;  Surgeon: Ladene Artist, MD;  Location: [facility];  Service: Endoscopy;  Laterality: N/A;  ? COLONOSCOPY WITH PROPOFOL N/A 08/12/2018  ? Procedure: COLONOSCOPY WITH PROPOFOL;  Surgeon: Ladene Artist, MD;  Location: [facility];  Service: Endoscopy;  Laterality: N/A;  ? COLONOSCOPY WITH PROPOFOL N/A 05/09/2020  ? Procedure: COLONOSCOPY WITH PROPOFOL;  Surgeon: Ladene Artist, MD;  Location: [facility];  Service: Endoscopy;  Laterality: N/A;  To Splenic Flexture  ? COLONOSCOPY WITH PROPOFOL N/A 11/23/2021  ? Procedure: COLONOSCOPY WITH PROPOFOL;  Surgeon: Mauri Pole, MD;  Location: [facility];  Service: Gastroenterology;  Laterality: N/A;  ? ESOPHAGOGASTRODUODENOSCOPY N/A 11/23/2021  ? Procedure: ESOPHAGOGASTRODUODENOSCOPY (EGD);  Surgeon: Mauri Pole, MD;  Location: [facility];  Service: Gastroenterology;  Laterality: N/A;  ?  ESOPHAGOGASTRODUODENOSCOPY (EGD) WITH PROPOFOL N/A 02/23/2015  ? Procedure: ESOPHAGOGASTRODUODENOSCOPY (EGD) WITH PROPOFOL;  Surgeon: Ladene Artist, MD;  Location: [facility];  Service: Endoscopy;  Laterality: N/A;  ? ESOPHAGOGASTRODUODENOSCOPY (EGD) WITH PROPOFOL N/A 08/12/2018  ? Procedure: ESOPHAGOGASTRODUODENOSCOPY (EGD) WITH PROPOFOL;  Surgeon: Ladene Artist, MD;  Location: [facility];  Service: Endoscopy;  Laterality: N/A;  ? ESOPHAGOGASTRODUODENOSCOPY (EGD) WITH PROPOFOL N/A 05/08/2020  ? Procedure: ESOPHAGOGASTRODUODENOSCOPY (EGD) WITH PROPOFOL;  Surgeon: Yetta Flock, MD;  Location: [facility];  Service: Gastroenterology;  Laterality: N/A;  ? HEMOSTASIS CLIP PLACEMENT  11/23/2021  ? Procedure: HEMOSTASIS CLIP PLACEMENT;  Surgeon: Mauri Pole, MD;  Location: [facility];  Service: Gastroenterology;;  ? HOT HEMOSTASIS N/A 08/12/2018  ? Procedure: HOT HEMOSTASIS (ARGON PLASMA COAGULATION/BICAP);  Surgeon: Ladene Artist, MD;  Location: [facility];  Service: Endoscopy;  Laterality: N/A;  EGD and Colon APC  ? HOT HEMOSTASIS N/A 05/08/2020  ? Procedure: HOT HEMOSTASIS (ARGON PLASMA COAGULATION/BICAP);  Surgeon: Jolly Mango

## 2021-12-12 DIAGNOSIS — I130 Hypertensive heart and chronic kidney disease with heart failure and stage 1 through stage 4 chronic kidney disease, or unspecified chronic kidney disease: Secondary | ICD-10-CM | POA: Diagnosis not present

## 2021-12-12 DIAGNOSIS — Q2733 Arteriovenous malformation of digestive system vessel: Secondary | ICD-10-CM | POA: Diagnosis not present

## 2021-12-12 DIAGNOSIS — K9401 Colostomy hemorrhage: Secondary | ICD-10-CM | POA: Diagnosis not present

## 2021-12-12 DIAGNOSIS — D62 Acute posthemorrhagic anemia: Secondary | ICD-10-CM | POA: Diagnosis not present

## 2021-12-12 DIAGNOSIS — L89312 Pressure ulcer of right buttock, stage 2: Secondary | ICD-10-CM | POA: Diagnosis not present

## 2021-12-13 DIAGNOSIS — I130 Hypertensive heart and chronic kidney disease with heart failure and stage 1 through stage 4 chronic kidney disease, or unspecified chronic kidney disease: Secondary | ICD-10-CM | POA: Diagnosis not present

## 2021-12-13 DIAGNOSIS — D62 Acute posthemorrhagic anemia: Secondary | ICD-10-CM | POA: Diagnosis not present

## 2021-12-13 DIAGNOSIS — L89312 Pressure ulcer of right buttock, stage 2: Secondary | ICD-10-CM | POA: Diagnosis not present

## 2021-12-13 DIAGNOSIS — K9401 Colostomy hemorrhage: Secondary | ICD-10-CM | POA: Diagnosis not present

## 2021-12-13 DIAGNOSIS — Q2733 Arteriovenous malformation of digestive system vessel: Secondary | ICD-10-CM | POA: Diagnosis not present

## 2021-12-18 DIAGNOSIS — C3490 Malignant neoplasm of unspecified part of unspecified bronchus or lung: Secondary | ICD-10-CM | POA: Diagnosis not present

## 2021-12-18 DIAGNOSIS — Z933 Colostomy status: Secondary | ICD-10-CM | POA: Diagnosis not present

## 2021-12-18 DIAGNOSIS — J910 Malignant pleural effusion: Secondary | ICD-10-CM | POA: Diagnosis not present

## 2021-12-19 DIAGNOSIS — Q2733 Arteriovenous malformation of digestive system vessel: Secondary | ICD-10-CM | POA: Diagnosis not present

## 2021-12-19 DIAGNOSIS — D62 Acute posthemorrhagic anemia: Secondary | ICD-10-CM | POA: Diagnosis not present

## 2021-12-19 DIAGNOSIS — K9401 Colostomy hemorrhage: Secondary | ICD-10-CM | POA: Diagnosis not present

## 2021-12-19 DIAGNOSIS — I130 Hypertensive heart and chronic kidney disease with heart failure and stage 1 through stage 4 chronic kidney disease, or unspecified chronic kidney disease: Secondary | ICD-10-CM | POA: Diagnosis not present

## 2021-12-19 DIAGNOSIS — L89312 Pressure ulcer of right buttock, stage 2: Secondary | ICD-10-CM | POA: Diagnosis not present

## 2021-12-20 DIAGNOSIS — I130 Hypertensive heart and chronic kidney disease with heart failure and stage 1 through stage 4 chronic kidney disease, or unspecified chronic kidney disease: Secondary | ICD-10-CM | POA: Diagnosis not present

## 2021-12-20 DIAGNOSIS — Q2733 Arteriovenous malformation of digestive system vessel: Secondary | ICD-10-CM | POA: Diagnosis not present

## 2021-12-20 DIAGNOSIS — D62 Acute posthemorrhagic anemia: Secondary | ICD-10-CM | POA: Diagnosis not present

## 2021-12-20 DIAGNOSIS — L89312 Pressure ulcer of right buttock, stage 2: Secondary | ICD-10-CM | POA: Diagnosis not present

## 2021-12-20 DIAGNOSIS — K9401 Colostomy hemorrhage: Secondary | ICD-10-CM | POA: Diagnosis not present

## 2021-12-21 ENCOUNTER — Other Ambulatory Visit: Payer: Self-pay | Admitting: Physician Assistant

## 2021-12-21 ENCOUNTER — Telehealth: Payer: Self-pay | Admitting: Physician Assistant

## 2021-12-21 DIAGNOSIS — D649 Anemia, unspecified: Secondary | ICD-10-CM

## 2021-12-21 NOTE — Telephone Encounter (Signed)
Called patient regarding upcoming appointment, left a voicemail.

## 2021-12-21 NOTE — Progress Notes (Signed)
[place] OFFICE PROGRESS NOTE  Laurie Lima, MD [place] [id number]  DIAGNOSIS: Stage IV (Tx, N3, M1C) non-small cell lung cancer, adenocarcinoma. She presented with a left primary lung lesion and widespread metastatic disease in the thoracic nodes, left pleural space, abdomen pelvic lymph nodes, and bones.  She was diagnosed in October 2022.  Molecular studies: The patient has positive EGFR mutation in exon 21 (L858R)    PRIOR THERAPY: None  CURRENT THERAPY:  Targeted treatment with Tagrisso 80 mg p.o. daily.  First dose started on June 21, 2021.  Status post 7 months.  INTERVAL HISTORY: [name] 81 y.o. female returns to the clinic today for a follow-up visit accompanied by her daughter.  The patient was hospitalized last month for GI bleeding.  She underwent an EGD and colonoscopy.  She also reports that she received 4 units of blood while in the hospital.  She last saw GI outpatient on 12/05/2021.  She does not have any follow-up appointments with them at this time.  She reports that she noticed reddish-brown clots of blood in her stool for the last 2 weeks.  She did not reach out to GI.  She also reports she had a few episodes of "heavy" nosebleeds.  She mentioned this to a home health nurse to reach out to her oxygen supply company for a humidifier.  Her last nosebleed was around Wednesday of last week.    She is currently undergoing oral targeted treatment with Tagrisso and she is tolerating it well without any concerning adverse side effects except diarrhea.  She does not take Imodium as instructed.  She denies any fever, night sweats, or unexplained weight loss.  She reports being cold frequently likely secondary to her anemia. She reports baseline dyspnea on exertion.  She mentions that when she undergoes physical therapy that she often has shortness of breath and needs to rest.  She also has some reactive tachycardia.  Her last day  physical therapy scheduled for tomorrow.  She denies any significant cough, hemoptysis, or chest pain.  She denies any nausea, vomiting, or constipation.  Denies any rashes or skin changes.  She reports she has bilateral blurry vision.  She has not had an eye exam in approximately 2 years.  She also reports some left-sided headaches.  She is here today for evaluation and repeat blood work.   MEDICAL HISTORY: Past Medical History:  Diagnosis Date   Allergy    Angiodysplasia of cecum    Angiodysplasia of duodenum    Arthritis    Asteatotic eczema 02/25/2016   Asthma    Atrial fibrillation ([place]) 10/17/2017   Blood transfusion without reported diagnosis    Cancer ([place])    Cataract    CHF (congestive heart failure), NYHA class III, chronic, combined ([place]) 08/10/2017   Colonic polyp 02/16/2008   Tubular adenoma   COPD (chronic obstructive pulmonary disease) ([place]) 01/27/1998   Cor pulmonale ([place]) 11/16/2014   GERD 07/30/1992   Gout    HTN (hypertension) 07/30/1988   Hyperlipidemia with target LDL less than 100 11/27/1996        Kidney stone 1960, 1972, [place] history non-contributory    MGUS (monoclonal gammopathy of unknown significance) 11/08/2016   Morbid obesity ([place]) 04/19/2010   She agrees to work on her lifestyle modifications to help her lose weight.    OSA (obstructive sleep apnea) 06/22/2013   Osteopenia, senile 02/05/2013   July 2014  -  2.1 left femur -1.9 left forearm    Prediabetes 11/13/2006   Renal insufficiency    Stenosis of cervical spine with myelopathy ([facility]) 01/01/2018   Supplemental oxygen dependent    2L via [place]   Ulcer of leg, chronic, right ([place]) 10/10/2011   ULCERATIVE COLITIS-LEFT SIDE 03/19/2008        Vitamin B12 deficiency anemia 11/13/2006        Vitamin D deficiency 02/06/2013    ALLERGIES:  is allergic to celebrex [celecoxib], amlodipine besylate, enalapril, lipitor [atorvastatin], trelegy ellipta [fluticasone-umeclidin-vilant],  amoxicillin, codeine sulfate, hydrocodone-acetaminophen, and tape.  MEDICATIONS:  Current Outpatient Medications  Medication Sig Dispense Refill   acetaminophen (TYLENOL) 325 MG tablet Take 325-650 mg by mouth daily as needed for moderate pain, fever or headache.      albuterol (PROAIR HFA) 108 (90 Base) MCG/ACT inhaler Inhale 1-2 puffs into the lungs every 6 (six) hours as needed for wheezing or shortness of breath. 18 g 11   allopurinol (ZYLOPRIM) 100 MG tablet TAKE 1 TABLET(100 MG) BY MOUTH DAILY (Patient taking differently: Take 100 mg by mouth daily.) 90 tablet 1   clonazePAM (KLONOPIN) 1 MG tablet Take 1 tablet (1 mg total) by mouth 2 (two) times daily as needed for anxiety. (Patient taking differently: Take 0.5-1 mg by mouth 2 (two) times daily as needed for anxiety.) 180 tablet 0   cyanocobalamin 2000 MCG tablet Take 1 tablet (2,000 mcg total) by mouth daily. 90 tablet 3   ferrous sulfate 325 (65 FE) MG tablet Take 1 tablet (325 mg total) by mouth 2 (two) times daily with a meal. 180 tablet 1   fluticasone furoate-vilanterol (BREO ELLIPTA) 100-25 MCG/ACT AEPB Inhale 1 puff into the lungs daily. 120 each 1   isosorbide-hydrALAZINE (BIDIL) 20-37.5 MG tablet TAKE 1 TABLET BY MOUTH THREE TIMES DAILY (Patient taking differently: Take 1 tablet by mouth in the morning and at bedtime.) 270 tablet 0   metoprolol tartrate (LOPRESSOR) 25 MG tablet TAKE 1 TABLET(25 MG) BY MOUTH TWICE DAILY 180 tablet 1   morphine (MS CONTIN) 15 MG 12 hr tablet Take 1 tablet (15 mg total) by mouth 2 (two) times daily as needed for pain. 60 tablet 0   Nutritional Supplements (CARNATION BREAKFAST ESSENTIALS) LIQD Take 237 mLs by mouth daily.     nystatin (MYCOSTATIN/NYSTOP) powder Apply 1 application. topically 3 (three) times daily. 60 g 0   osimertinib mesylate (TAGRISSO) 80 MG tablet Take 1 tablet (80 mg total) by mouth daily. 30 tablet 3   OXYGEN Inhale 2 L/min into the lungs continuous.     prochlorperazine  (COMPAZINE) 10 MG tablet Take 1 tablet (10 mg total) by mouth every 6 (six) hours as needed. (Patient taking differently: Take 10 mg by mouth every 6 (six) hours as needed for nausea or vomiting.) 30 tablet 2   protein supplement shake (PREMIER PROTEIN) LIQD Take 11 oz by mouth 2 (two) times daily between meals.     torsemide (DEMADEX) 20 MG tablet TAKE 2 TABLETS(40 MG) BY MOUTH DAILY 180 tablet 1   Current Facility-Administered Medications  Medication Dose Route Frequency Provider Last Rate Last Admin   cyanocobalamin ((VITAMIN B-12)) injection 1,000 mcg  1,000 mcg Intramuscular Q30 days Laurie Lima, MD   1,000 mcg at 12/06/21 1143    SURGICAL HISTORY:  Past Surgical History:  Procedure Laterality Date   BRONCHOSCOPY     CHEST TUBE INSERTION Left 06/15/2021   Procedure: INSERTION PLEURAL DRAINAGE CATHETER;  Surgeon: [name],  MD;  Location: [facility];  Service: Thoracic;  Laterality: Left;   COLONOSCOPY     COLONOSCOPY WITH PROPOFOL N/A 02/23/2015   Procedure: COLONOSCOPY WITH PROPOFOL;  Surgeon: Ladene Artist, MD;  Location: [facility];  Service: Endoscopy;  Laterality: N/A;   COLONOSCOPY WITH PROPOFOL N/A 08/12/2018   Procedure: COLONOSCOPY WITH PROPOFOL;  Surgeon: Ladene Artist, MD;  Location: [facility];  Service: Endoscopy;  Laterality: N/A;   COLONOSCOPY WITH PROPOFOL N/A 05/09/2020   Procedure: COLONOSCOPY WITH PROPOFOL;  Surgeon: Ladene Artist, MD;  Location: [facility];  Service: Endoscopy;  Laterality: N/A;  To Splenic Flexture   COLONOSCOPY WITH PROPOFOL N/A 11/23/2021   Procedure: COLONOSCOPY WITH PROPOFOL;  Surgeon: Mauri Pole, MD;  Location: [facility];  Service: Gastroenterology;  Laterality: N/A;   ESOPHAGOGASTRODUODENOSCOPY N/A 11/23/2021   Procedure: ESOPHAGOGASTRODUODENOSCOPY (EGD);  Surgeon: Mauri Pole, MD;  Location: [facility];  Service: Gastroenterology;  Laterality: N/A;   ESOPHAGOGASTRODUODENOSCOPY (EGD) WITH PROPOFOL  N/A 02/23/2015   Procedure: ESOPHAGOGASTRODUODENOSCOPY (EGD) WITH PROPOFOL;  Surgeon: Ladene Artist, MD;  [facility];  Service: Endoscopy;  Laterality: N/A;   ESOPHAGOGASTRODUODENOSCOPY (EGD) WITH PROPOFOL N/A 08/12/2018   Procedure: ESOPHAGOGASTRODUODENOSCOPY (EGD) WITH PROPOFOL;  Surgeon: Ladene Artist, MD;  [facility];  Service: Endoscopy;  Laterality: N/A;   ESOPHAGOGASTRODUODENOSCOPY (EGD) WITH PROPOFOL N/A 05/08/2020   Procedure: ESOPHAGOGASTRODUODENOSCOPY (EGD) WITH PROPOFOL;  Surgeon: Yetta Flock, MD;  [facility];  Service: Gastroenterology;  Laterality: N/A;   HEMOSTASIS CLIP PLACEMENT  11/23/2021   Procedure: HEMOSTASIS CLIP PLACEMENT;  Surgeon: Mauri Pole, MD;  [facility];  Service: Gastroenterology;;   HOT HEMOSTASIS N/A 08/12/2018   Procedure: HOT HEMOSTASIS (ARGON PLASMA COAGULATION/BICAP);  Surgeon: Ladene Artist, MD;  Location: [facility];  Service: Endoscopy;  Laterality: N/A;  EGD and Colon APC   HOT HEMOSTASIS N/A 05/08/2020   Procedure: HOT HEMOSTASIS (ARGON PLASMA COAGULATION/BICAP);  Surgeon: Yetta Flock, MD;  Location: [facility];  Service: Gastroenterology;  Laterality: N/A;   HOT HEMOSTASIS N/A 11/23/2021   Procedure: HOT HEMOSTASIS (ARGON PLASMA COAGULATION/BICAP);  Surgeon: Mauri Pole, MD;  Location: [facility];  Service: Gastroenterology;  Laterality: N/A;   LAPAROTOMY N/A 08/11/2020   Procedure: EXPLORATORY LAPAROTOMY PARTIAL COLECTOMY AND COLOSTOMY WITH RESECTION OF A MESSENTERIC MASS;  Surgeon: Coralie Keens, MD;  Location: [facility];  Service: General;  Laterality: N/A;   POLYPECTOMY     REMOVAL OF PLEURAL DRAINAGE CATHETER N/A 10/17/2021   Procedure: MINOR REMOVAL OF PLEURAL DRAINAGE CATHETER;  Surgeon: [name], MD;  Location: [facility];  Service: Cardiopulmonary;  Laterality: N/A;    REVIEW OF SYSTEMS:   Review of Systems  Constitutional: Positive for  fatigue and cold intolerance.  Negative for appetite change, fever and unexpected weight change.  HENT: Positive for few episodes of epistaxis over the last 2 weeks.  Most recent being approximately 1 week ago.  Negative for mouth sores,  sore throat and trouble swallowing.   Eyes: Positive for visual blurring bilaterally.  Negative for icterus.  Respiratory: Positive for dyspnea on exertion.  Negative for cough, hemoptysis, and wheezing.   Cardiovascular: Negative for chest pain and leg swelling.  Gastrointestinal: Positive for intermittent diarrhea.  Negative for abdominal pain, constipation, nausea and vomiting.  Genitourinary: Negative for bladder incontinence, difficulty urinating, dysuria, frequency and hematuria.   Musculoskeletal: Negative for back pain, gait problem, neck pain and neck stiffness.  Skin: Negative for itching and rash.  Neurological: Positive  for left-sided headaches.  Positive for intermittent dizziness. Negative for extremity weakness, gait problem, light-headedness and seizures.  Hematological: Negative for adenopathy. Does not bruise/bleed easily.  Psychiatric/Behavioral: Negative for confusion, depression and sleep disturbance. The patient is not nervous/anxious.     PHYSICAL EXAMINATION:  Blood pressure (!) 140/35, pulse 61, temperature 97.9 F (36.6 C), temperature source Axillary, resp. rate 17, height 5' (1.524 m), SpO2 100 %.  ECOG PERFORMANCE STATUS: 2-3  Physical Exam  Constitutional: Oriented to person, place, and time and elderly appearing female and in no distress  HENT:  Head: Normocephalic and atraumatic.  Mouth/Throat: Oropharynx is clear and moist. No oropharyngeal exudate.  Eyes: Conjunctivae are normal. Right eye exhibits no discharge. Left eye exhibits no discharge. No scleral icterus.  Neck: Normal range of motion. Neck supple.  Cardiovascular: Normal rate, regular rhythm, positive for systolic murmur.  And intact distal pulses.    Pulmonary/Chest: Effort normal and breath sounds normal. No respiratory distress. No wheezes. No rales.  Abdominal: Soft. Bowel sounds are normal. Exhibits no distension and no mass. There is no tenderness.  Musculoskeletal: Normal range of motion. Exhibits no edema.  Lymphadenopathy:    No cervical adenopathy.  Neurological: Alert and oriented to person, place, and time.  Exhibits muscle wasting.  Patient was examined in the wheelchair.  Skin: Skin is warm and dry. No rash noted. Not diaphoretic. No erythema.  Pallor noted. Psychiatric: Mood, memory and judgment normal.  Vitals reviewed.  LABORATORY DATA: Lab Results  Component Value Date   WBC 5.4 12/26/2021   HGB 7.1 (L) 12/26/2021   HCT 22.9 (L) 12/26/2021   MCV 88.1 12/26/2021   PLT 99 (L) 12/26/2021      Chemistry      Component Value Date/Time   NA 138 12/11/2021 0852   NA 142 04/11/2016 1007   K 4.2 12/11/2021 0852   K 3.3 (L) 04/11/2016 1007   CL 101 12/11/2021 0852   CO2 32 12/11/2021 0852   CO2 31 (H) 04/11/2016 1007   BUN 48 (H) 12/11/2021 0852   BUN 24.4 04/11/2016 1007   CREATININE 1.80 (H) 12/11/2021 0852   CREATININE 1.36 (H) 02/04/2020 1443   CREATININE 1.1 04/11/2016 1007      Component Value Date/Time   CALCIUM 9.6 12/11/2021 0852   CALCIUM 9.8 04/11/2016 1007   ALKPHOS 39 12/11/2021 0852   ALKPHOS 82 04/11/2016 1007   AST 12 (L) 12/11/2021 0852   AST 12 04/11/2016 1007   ALT <5 12/11/2021 0852   ALT 12 04/11/2016 1007   BILITOT 0.3 12/11/2021 0852   BILITOT 0.35 04/11/2016 1007       RADIOGRAPHIC STUDIES:  No results found.   ASSESSMENT/PLAN:  Is a very pleasant 81 year old Caucasian female with stage IV (TX, N3, M1C) non-small cell lung cancer, adenocarcinoma.  She is positive for an EGFR mutation in exon 21.  She was diagnosed in October 2021.  She presented with a left primary lung lesion in addition to widespread metastatic disease in the thoracic nodules, left pleural space, abdominal  lymph nodes, and metastatic disease to the bones.  She is currently undergoing treatment with Tagrisso 80 mg p.o. daily.  This was for started on 06/21/2021.  She is status post 7 months of treatment.  The patient was seen with Dr. Julien Nordmann today.  Labs were reviewed.  Her CMP is pending at this time.  Recommend that she proceed with Tagrisso at the same dose.  We will see her back for follow-up  visit in 4 weeks for evaluation and repeat CT of the chest, abdomen, and pelvis.  Due to her gait changes, headaches, blurry vision, we will arrange for repeat brain MRI to rule out any metastatic disease to the brain.  I advised her to take Imodium for diarrhea to avoid any dehydration or electrolyte derangements.  I also instructed her to reach out to gastroenterology if she has blood in her stool.    If she ever has severe epistaxis, I advised her to go to the emergency room.   We will arrange for lab visits and possible 1 unit of blood every 2 weeks.  I have arranged for a CBC, CMP, iron studies, ferritin, and a sample of the blood bank to be performed at her next lab draw.    We will arrange for her to receive 2 units of blood today.  I advised her to use saline spray for her nose to keep it moist to hopefully prevent any nosebleeding.  She will also use a fire for her oxygen.  The patient was advised to call immediately if she has any concerning symptoms in the interval. The patient voices understanding of current disease status and treatment options and is in agreement with the current care plan. All questions were answered. The patient knows to call the clinic with any problems, questions or concerns. We can certainly see the patient much sooner if necessary      Orders Placed This Encounter  Procedures   MR Brain W Wo Contrast    Standing Status:   Future    Standing Expiration Date:   12/26/2022    Order Specific Question:   If indicated for the ordered procedure, I authorize the  administration of contrast media per Radiology protocol    Answer:   Yes    Order Specific Question:   What is the patient's sedation requirement?    Answer:   No Sedation    Order Specific Question:   Does the patient have a pacemaker or implanted devices?    Answer:   No    Order Specific Question:   Use SRS Protocol?    Answer:   No    Order Specific Question:   Preferred imaging location?    Answer:   [facility] (table limit - 550 lbs)   CT Chest Wo Contrast    Standing Status:   Future    Standing Expiration Date:   12/26/2022    Order Specific Question:   Preferred imaging location?    Answer:   [facility]    Order Specific Question:   Release to patient    Answer:   Immediate   CT Abdomen Pelvis Wo Contrast    Standing Status:   Future    Standing Expiration Date:   12/26/2022    Order Specific Question:   Preferred imaging location?    Answer:   [facility]    Order Specific Question:   Is Oral Contrast requested for this exam?    Answer:   Yes, Per Radiology protocol   CBC with Differential ([place] Only)    Standing Status:   Standing    Number of Occurrences:   4    Standing Expiration Date:   12/27/2022   CMP ([place] only)    Standing Status:   Standing    Number of Occurrences:   4    Standing Expiration Date:   12/27/2022   Iron and Iron  Binding Capacity ([facility] only)    Standing Status:   Future    Standing Expiration Date:   12/27/2022   Ferritin    Standing Status:   Future    Standing Expiration Date:   12/27/2022   Sample to Blood Bank    Standing Status:   Standing    Number of Occurrences:   10    Standing Expiration Date:   12/27/2022     Tobe Sos Kristyne Woodring, PA-C 12/26/21  ADDENDUM: Hematology/Oncology Attending: I had a face-to-face encounter with the patient today.  I reviewed her records, lab and recommended her care plan.  This is a very pleasant 81 years old white female with stage IV non-small  cell lung cancer, adenocarcinoma presented with left primary lung lesion in addition to widespread metastatic disease in the thoracic nodes, left pleural space, abdomen, pelvic lymph nodes and bones diagnosed in October 2022.  The patient has positive EGFR mutation with point mutation in exon 21 (L858R). She has started treatment with Tagrisso 80 mg p.o. daily on June 21, 2021 and has been tolerating her treatment well with no concerning adverse effects. She continues to have significant anemia secondary to gastrointestinal hemorrhage secondary to AV malformation and unfortunately there is no good intervention to stop her from bleeding. She require PRBCs transfusion at regular basis.  She continues to complain of headache. I recommended for the patient to receive 2 units of PRBCs transfusion in the next few days. She will also continue her current treatment with Tagrisso with the same dose. We will see her back for follow-up visit in 1 months for evaluation with repeat CT scan of the chest, abdomen and pelvis as well as MRI of the brain. The patient will have repeat blood work in 2 weeks to see if she will need any additional PRBCs transfusion. She was advised to call immediately if she has any other concerning symptoms in the interval. The total time spent in the appointment was 30 minutes. Disclaimer: This note was dictated with voice recognition software. Similar sounding words can inadvertently be transcribed and may be missed upon review. Eilleen Kempf, MD

## 2021-12-26 ENCOUNTER — Inpatient Hospital Stay: Payer: Medicare Other

## 2021-12-26 ENCOUNTER — Other Ambulatory Visit: Payer: Self-pay

## 2021-12-26 ENCOUNTER — Inpatient Hospital Stay: Payer: Medicare Other | Admitting: Physician Assistant

## 2021-12-26 ENCOUNTER — Encounter: Payer: Self-pay | Admitting: Dietician

## 2021-12-26 VITALS — BP 140/35 | HR 61 | Temp 97.9°F | Resp 17 | Ht 60.0 in

## 2021-12-26 DIAGNOSIS — K5521 Angiodysplasia of colon with hemorrhage: Secondary | ICD-10-CM | POA: Diagnosis not present

## 2021-12-26 DIAGNOSIS — C782 Secondary malignant neoplasm of pleura: Secondary | ICD-10-CM | POA: Diagnosis not present

## 2021-12-26 DIAGNOSIS — C3492 Malignant neoplasm of unspecified part of left bronchus or lung: Secondary | ICD-10-CM

## 2021-12-26 DIAGNOSIS — C778 Secondary and unspecified malignant neoplasm of lymph nodes of multiple regions: Secondary | ICD-10-CM | POA: Diagnosis not present

## 2021-12-26 DIAGNOSIS — D500 Iron deficiency anemia secondary to blood loss (chronic): Secondary | ICD-10-CM | POA: Diagnosis not present

## 2021-12-26 DIAGNOSIS — D649 Anemia, unspecified: Secondary | ICD-10-CM

## 2021-12-26 DIAGNOSIS — I5042 Chronic combined systolic (congestive) and diastolic (congestive) heart failure: Secondary | ICD-10-CM | POA: Diagnosis not present

## 2021-12-26 DIAGNOSIS — C7951 Secondary malignant neoplasm of bone: Secondary | ICD-10-CM | POA: Diagnosis not present

## 2021-12-26 DIAGNOSIS — I110 Hypertensive heart disease with heart failure: Secondary | ICD-10-CM | POA: Diagnosis not present

## 2021-12-26 LAB — CMP (CANCER CENTER ONLY)
ALT: <5 U/L (ref 0–44)
AST: 11 U/L — ABNORMAL LOW (ref 15–41)
Albumin: 3.5 g/dL (ref 3.5–5.0)
Alkaline Phosphatase: 43 U/L (ref 38–126)
Anion gap: 4 — ABNORMAL LOW (ref 5–15)
BUN: 48 mg/dL — ABNORMAL HIGH (ref 8–23)
CO2: 31 mmol/L (ref 22–32)
Calcium: 9.8 mg/dL (ref 8.9–10.3)
Chloride: 103 mmol/L (ref 98–111)
Creatinine: 1.82 mg/dL — ABNORMAL HIGH (ref 0.44–1.00)
GFR, Estimated: 28 mL/min — ABNORMAL LOW (ref >60)
Glucose, Bld: 105 mg/dL — ABNORMAL HIGH (ref 70–99)
Potassium: 4 mmol/L (ref 3.5–5.1)
Sodium: 138 mmol/L (ref 135–145)
Total Bilirubin: 0.3 mg/dL (ref 0.3–1.2)
Total Protein: 6.8 g/dL (ref 6.5–8.1)

## 2021-12-26 LAB — CBC WITH DIFFERENTIAL (CANCER CENTER ONLY)
Abs Immature Granulocytes: 0.01 10*3/uL (ref 0.00–0.07)
Basophils Absolute: 0 10*3/uL (ref 0.0–0.1)
Basophils Relative: 0 %
Eosinophils Absolute: 0.1 10*3/uL (ref 0.0–0.5)
Eosinophils Relative: 2 %
HCT: 22.9 % — ABNORMAL LOW (ref 36.0–46.0)
Hemoglobin: 7.1 g/dL — ABNORMAL LOW (ref 12.0–15.0)
Immature Granulocytes: 0 %
Lymphocytes Relative: 15 %
Lymphs Abs: 0.8 10*3/uL (ref 0.7–4.0)
MCH: 27.3 pg (ref 26.0–34.0)
MCHC: 31 g/dL (ref 30.0–36.0)
MCV: 88.1 fL (ref 80.0–100.0)
Monocytes Absolute: 0.5 10*3/uL (ref 0.1–1.0)
Monocytes Relative: 10 %
Neutro Abs: 4 10*3/uL (ref 1.7–7.7)
Neutrophils Relative %: 73 %
Platelet Count: 99 10*3/uL — ABNORMAL LOW (ref 150–400)
RBC: 2.6 MIL/uL — ABNORMAL LOW (ref 3.87–5.11)
RDW: 15 % (ref 11.5–15.5)
WBC Count: 5.4 10*3/uL (ref 4.0–10.5)
nRBC: 0 % (ref 0.0–0.2)

## 2021-12-26 LAB — SAMPLE TO BLOOD BANK

## 2021-12-26 LAB — PREPARE RBC (CROSSMATCH)

## 2021-12-26 NOTE — Progress Notes (Signed)
Provided one case of Ensure Plus High Protein

## 2021-12-26 NOTE — Progress Notes (Signed)
Per blood bank, blood will take about 2-3 hours to prepare due to antibodies. Patient made aware and she will come to [facility] tomorrow morning at 0930. Patient verbalized understanding that she needs to keep her blue armband on.

## 2021-12-27 ENCOUNTER — Inpatient Hospital Stay: Payer: Medicare Other

## 2021-12-27 DIAGNOSIS — C7951 Secondary malignant neoplasm of bone: Secondary | ICD-10-CM | POA: Diagnosis not present

## 2021-12-27 DIAGNOSIS — C782 Secondary malignant neoplasm of pleura: Secondary | ICD-10-CM | POA: Diagnosis not present

## 2021-12-27 DIAGNOSIS — D500 Iron deficiency anemia secondary to blood loss (chronic): Secondary | ICD-10-CM | POA: Diagnosis not present

## 2021-12-27 DIAGNOSIS — I110 Hypertensive heart disease with heart failure: Secondary | ICD-10-CM | POA: Diagnosis not present

## 2021-12-27 DIAGNOSIS — I5042 Chronic combined systolic (congestive) and diastolic (congestive) heart failure: Secondary | ICD-10-CM | POA: Diagnosis not present

## 2021-12-27 DIAGNOSIS — C3492 Malignant neoplasm of unspecified part of left bronchus or lung: Secondary | ICD-10-CM | POA: Diagnosis not present

## 2021-12-27 DIAGNOSIS — D649 Anemia, unspecified: Secondary | ICD-10-CM

## 2021-12-27 DIAGNOSIS — C778 Secondary and unspecified malignant neoplasm of lymph nodes of multiple regions: Secondary | ICD-10-CM | POA: Diagnosis not present

## 2021-12-27 MED ORDER — DIPHENHYDRAMINE HCL 25 MG PO CAPS
25.0000 mg | ORAL_CAPSULE | Freq: Once | ORAL | Status: AC
  Administered 2021-12-27: 25 mg via ORAL
  Filled 2021-12-27: qty 1

## 2021-12-27 MED ORDER — ACETAMINOPHEN 325 MG PO TABS
650.0000 mg | ORAL_TABLET | Freq: Once | ORAL | Status: AC
  Administered 2021-12-27: 650 mg via ORAL
  Filled 2021-12-27: qty 2

## 2021-12-27 MED ORDER — SODIUM CHLORIDE 0.9% IV SOLUTION
250.0000 mL | Freq: Once | INTRAVENOUS | Status: AC
  Administered 2021-12-27: 250 mL via INTRAVENOUS

## 2021-12-27 NOTE — Patient Instructions (Signed)
Blood Transfusion, Adult, Care After This sheet gives you information about how to care for yourself after your procedure. Your doctor may also give you more specific instructions. If you have problems or questions, contact your doctor. What can I expect after the procedure? After the procedure, it is common to have: Bruising and soreness at the IV site. A headache. Follow these instructions at home: Insertion site care     Follow instructions from your doctor about how to take care of your insertion site. This is where an IV tube was put into your vein. Make sure you: Wash your hands with soap and water before and after you change your bandage (dressing). If you cannot use soap and water, use hand sanitizer. Change your bandage as told by your doctor. Check your insertion site every day for signs of infection. Check for: Redness, swelling, or pain. Bleeding from the site. Warmth. Pus or a bad smell. General instructions Take over-the-counter and prescription medicines only as told by your doctor. Rest as told by your doctor. Go back to your normal activities as told by your doctor. Keep all follow-up visits as told by your doctor. This is important. Contact a doctor if: You have itching or red, swollen areas of skin (hives). You feel worried or nervous (anxious). You feel weak after doing your normal activities. You have redness, swelling, warmth, or pain around the insertion site. You have blood coming from the insertion site, and the blood does not stop with pressure. You have pus or a bad smell coming from the insertion site. Get help right away if: You have signs of a serious reaction. This may be coming from an allergy or the body's defense system (immune system). Signs include: Trouble breathing or shortness of breath. Swelling of the face or feeling warm (flushed). Fever or chills. Head, chest, or back pain. Dark pee (urine) or blood in the pee. Widespread rash. Fast  heartbeat. Feeling dizzy or light-headed. You may receive your blood transfusion in an outpatient setting. If so, you will be told whom to contact to report any reactions. These symptoms may be an emergency. Do not wait to see if the symptoms will go away. Get medical help right away. Call your local emergency services (911 in the [place] Do not drive yourself to the hospital. Summary Bruising and soreness at the IV site are common. Check your insertion site every day for signs of infection. Rest as told by your doctor. Go back to your normal activities as told by your doctor. Get help right away if you have signs of a serious reaction. This information is not intended to replace advice given to you by your health care provider. Make sure you discuss any questions you have with your health care provider. Document Revised: 11/10/2020 Document Reviewed: 01/08/2019 Elsevier Patient Education  [place].

## 2021-12-28 ENCOUNTER — Telehealth: Payer: Self-pay | Admitting: Internal Medicine

## 2021-12-28 ENCOUNTER — Telehealth: Payer: Self-pay | Admitting: Nurse Practitioner

## 2021-12-28 DIAGNOSIS — Q2733 Arteriovenous malformation of digestive system vessel: Secondary | ICD-10-CM | POA: Diagnosis not present

## 2021-12-28 DIAGNOSIS — L89312 Pressure ulcer of right buttock, stage 2: Secondary | ICD-10-CM | POA: Diagnosis not present

## 2021-12-28 DIAGNOSIS — I130 Hypertensive heart and chronic kidney disease with heart failure and stage 1 through stage 4 chronic kidney disease, or unspecified chronic kidney disease: Secondary | ICD-10-CM | POA: Diagnosis not present

## 2021-12-28 DIAGNOSIS — K9401 Colostomy hemorrhage: Secondary | ICD-10-CM | POA: Diagnosis not present

## 2021-12-28 DIAGNOSIS — D62 Acute posthemorrhagic anemia: Secondary | ICD-10-CM | POA: Diagnosis not present

## 2021-12-28 LAB — BPAM RBC
Blood Product Expiration Date: 202306222359
Blood Product Expiration Date: 202306242359
ISSUE DATE / TIME: 202305310948
ISSUE DATE / TIME: 202305310948
Unit Type and Rh: 5100
Unit Type and Rh: 5100

## 2021-12-28 LAB — TYPE AND SCREEN
ABO/RH(D): O POS
Antibody Screen: NEGATIVE
Donor AG Type: NEGATIVE
Donor AG Type: NEGATIVE
Unit division: 0
Unit division: 0

## 2021-12-28 NOTE — Telephone Encounter (Signed)
Inbound call from patient stating she went to her oncologist and had blood work done and stated that her blood was low. Patient advised me that she Is currently having blood In her  ostomy bag. Patient is seeking advice on what she needs to do.  Please advise.

## 2021-12-28 NOTE — Telephone Encounter (Signed)
Spoke with pt and pt states she started having blood in her ostomy bag this morning. Pt received 2 units of blood yesterday because her last hemoglobin was 7.1. Pt stated that she had to empty her ostomy twice this morning and contents were just red blood both times. The third time she had to empty her bag it was reddish brown liquid. Pt states she started having more diarrhea on Tuesday of this week and took an imodium because diarrhea comes and goes with her chemo. Asked pt if she has had any dizziness, lightheadedness, fainting, or SOB and pt stated she has dizziness. Advised pt to go to ER to be assessed and they may want her to have another colonoscopy to cauterize an area. Pt stated that the last time only lasted her a month and she really didn't want to go to ER. Let pt know she should go to ER to get bleeding assessed. Pt verbalized understanding and stated she would think about going.

## 2021-12-28 NOTE — Telephone Encounter (Signed)
Don with [facility] called to report that patient's BP today was 150/60, pulse was 50 and patient reports that her pain level is at a 9. She has morphine for the pain but has not taken it because she does not like it.

## 2021-12-29 ENCOUNTER — Encounter: Payer: Self-pay | Admitting: Podiatry

## 2021-12-29 ENCOUNTER — Ambulatory Visit: Payer: Medicare Other | Admitting: Podiatry

## 2021-12-29 DIAGNOSIS — I872 Venous insufficiency (chronic) (peripheral): Secondary | ICD-10-CM

## 2021-12-29 DIAGNOSIS — M79675 Pain in left toe(s): Secondary | ICD-10-CM | POA: Diagnosis not present

## 2021-12-29 DIAGNOSIS — B351 Tinea unguium: Secondary | ICD-10-CM | POA: Diagnosis not present

## 2021-12-29 DIAGNOSIS — M79674 Pain in right toe(s): Secondary | ICD-10-CM

## 2021-12-29 NOTE — Telephone Encounter (Signed)
Laurie Craze, NP    Agree. There isn't anything we can do over the phone or in the office.

## 2021-12-29 NOTE — Telephone Encounter (Signed)
Called pt and let her know [name]'s recommendations.

## 2021-12-29 NOTE — Progress Notes (Signed)
This patient returns to my office for at risk foot care.  This patient requires this care by a professional since this patient will be at risk due to having venous stasis  This patient is unable to cut nails herself since the patient cannot reach her nails.These nails are painful walking and wearing shoes.  This patient presents for at risk foot care today.  She presents to the office with her granddaughter.Nails have not been done in over one year.  General Appearance  Alert, conversant and in no acute stress.  Vascular  Dorsalis pedis and posterior tibial  pulses are  weakly palpable  bilaterally.  Capillary return is within normal limits  bilaterally. Temperature is within normal limits  bilaterally.  Neurologic  Senn-Weinstein monofilament wire test within normal limits  bilaterally. Muscle power within normal limits bilaterally.  Nails Thick disfigured discolored nails with subungual debris  from hallux to fifth toes bilaterally. No evidence of bacterial infection or drainage bilaterally.  Orthopedic  No limitations of motion  feet .  No crepitus or effusions noted.  No bony pathology or digital deformities noted.  Skin  normotropic skin with no porokeratosis noted bilaterally.  No signs of infections or ulcers noted.     Onychomycosis  Pain in right toes  Pain in left toes  Consent was obtained for treatment procedures.  IE. Mechanical debridement of nails 1-5  bilaterally performed with a nail nipper.  Filed with dremel without incident.    Return office visit  10 weeks                    Told patient to return for periodic foot care and evaluation due to potential at risk complications.   Gardiner Barefoot DPM

## 2022-01-03 ENCOUNTER — Telehealth: Payer: Self-pay | Admitting: Internal Medicine

## 2022-01-03 NOTE — Telephone Encounter (Signed)
Scheduled per 05/31 los, patient has been called and notified.

## 2022-01-10 ENCOUNTER — Other Ambulatory Visit: Payer: Self-pay | Admitting: Lab

## 2022-01-10 ENCOUNTER — Telehealth: Payer: Self-pay | Admitting: Medical Oncology

## 2022-01-10 ENCOUNTER — Inpatient Hospital Stay: Payer: Medicare Other | Attending: Internal Medicine

## 2022-01-10 ENCOUNTER — Other Ambulatory Visit: Payer: Self-pay | Admitting: Physician Assistant

## 2022-01-10 ENCOUNTER — Other Ambulatory Visit: Payer: Self-pay

## 2022-01-10 DIAGNOSIS — C778 Secondary and unspecified malignant neoplasm of lymph nodes of multiple regions: Secondary | ICD-10-CM | POA: Insufficient documentation

## 2022-01-10 DIAGNOSIS — N289 Disorder of kidney and ureter, unspecified: Secondary | ICD-10-CM | POA: Insufficient documentation

## 2022-01-10 DIAGNOSIS — C782 Secondary malignant neoplasm of pleura: Secondary | ICD-10-CM | POA: Diagnosis not present

## 2022-01-10 DIAGNOSIS — J90 Pleural effusion, not elsewhere classified: Secondary | ICD-10-CM | POA: Insufficient documentation

## 2022-01-10 DIAGNOSIS — I509 Heart failure, unspecified: Secondary | ICD-10-CM | POA: Diagnosis not present

## 2022-01-10 DIAGNOSIS — C3492 Malignant neoplasm of unspecified part of left bronchus or lung: Secondary | ICD-10-CM | POA: Diagnosis not present

## 2022-01-10 DIAGNOSIS — C7951 Secondary malignant neoplasm of bone: Secondary | ICD-10-CM | POA: Diagnosis not present

## 2022-01-10 DIAGNOSIS — D500 Iron deficiency anemia secondary to blood loss (chronic): Secondary | ICD-10-CM | POA: Insufficient documentation

## 2022-01-10 DIAGNOSIS — I110 Hypertensive heart disease with heart failure: Secondary | ICD-10-CM | POA: Insufficient documentation

## 2022-01-10 DIAGNOSIS — D649 Anemia, unspecified: Secondary | ICD-10-CM

## 2022-01-10 LAB — CMP (CANCER CENTER ONLY)
ALT: 5 U/L (ref 0–44)
AST: 12 U/L — ABNORMAL LOW (ref 15–41)
Albumin: 3.5 g/dL (ref 3.5–5.0)
Alkaline Phosphatase: 49 U/L (ref 38–126)
Anion gap: 5 (ref 5–15)
BUN: 70 mg/dL — ABNORMAL HIGH (ref 8–23)
CO2: 31 mmol/L (ref 22–32)
Calcium: 9.8 mg/dL (ref 8.9–10.3)
Chloride: 100 mmol/L (ref 98–111)
Creatinine: 1.97 mg/dL — ABNORMAL HIGH (ref 0.44–1.00)
GFR, Estimated: 25 mL/min — ABNORMAL LOW (ref >60)
Glucose, Bld: 117 mg/dL — ABNORMAL HIGH (ref 70–99)
Potassium: 4.3 mmol/L (ref 3.5–5.1)
Sodium: 136 mmol/L (ref 135–145)
Total Bilirubin: 0.3 mg/dL (ref 0.3–1.2)
Total Protein: 7 g/dL (ref 6.5–8.1)

## 2022-01-10 LAB — CBC WITH DIFFERENTIAL (CANCER CENTER ONLY)
Abs Immature Granulocytes: 0.02 10*3/uL (ref 0.00–0.07)
Basophils Absolute: 0 10*3/uL (ref 0.0–0.1)
Basophils Relative: 0 %
Eosinophils Absolute: 0.1 10*3/uL (ref 0.0–0.5)
Eosinophils Relative: 2 %
HCT: 28.3 % — ABNORMAL LOW (ref 36.0–46.0)
Hemoglobin: 8.9 g/dL — ABNORMAL LOW (ref 12.0–15.0)
Immature Granulocytes: 0 %
Lymphocytes Relative: 13 %
Lymphs Abs: 0.9 10*3/uL (ref 0.7–4.0)
MCH: 27.4 pg (ref 26.0–34.0)
MCHC: 31.4 g/dL (ref 30.0–36.0)
MCV: 87.1 fL (ref 80.0–100.0)
Monocytes Absolute: 0.6 10*3/uL (ref 0.1–1.0)
Monocytes Relative: 9 %
Neutro Abs: 5.2 10*3/uL (ref 1.7–7.7)
Neutrophils Relative %: 76 %
Platelet Count: 135 10*3/uL — ABNORMAL LOW (ref 150–400)
RBC: 3.25 MIL/uL — ABNORMAL LOW (ref 3.87–5.11)
RDW: 14.1 % (ref 11.5–15.5)
WBC Count: 6.8 10*3/uL (ref 4.0–10.5)
nRBC: 0 % (ref 0.0–0.2)

## 2022-01-10 LAB — IRON AND IRON BINDING CAPACITY (CC-WL,HP ONLY)
Iron: 38 ug/dL (ref 28–170)
Saturation Ratios: 13 % (ref 10.4–31.8)
TIBC: 301 ug/dL (ref 250–450)
UIBC: 263 ug/dL (ref 148–442)

## 2022-01-10 LAB — FERRITIN: Ferritin: 56 ng/mL (ref 11–307)

## 2022-01-10 LAB — SAMPLE TO BLOOD BANK

## 2022-01-10 NOTE — Telephone Encounter (Signed)
Reviewed CBC/diff with pt and her HGB is better.   Last Friday she did have some BRB in her stools and again yesterday. I advised her to contact her GI specialist.   "There is nothing they can do"I.   reinforced to [name] to go to ED for increased bleeding . She voiced understanding.

## 2022-01-11 ENCOUNTER — Encounter: Payer: Self-pay | Admitting: Internal Medicine

## 2022-01-17 ENCOUNTER — Ambulatory Visit (HOSPITAL_COMMUNITY)
Admission: RE | Admit: 2022-01-17 | Discharge: 2022-01-17 | Disposition: A | Discharge status: Home / Self Care | Payer: Medicare Other | Source: Ambulatory Visit | Attending: Physician Assistant | Admitting: Physician Assistant

## 2022-01-17 ENCOUNTER — Encounter (HOSPITAL_COMMUNITY): Payer: Self-pay

## 2022-01-17 DIAGNOSIS — C3492 Malignant neoplasm of unspecified part of left bronchus or lung: Secondary | ICD-10-CM | POA: Insufficient documentation

## 2022-01-17 DIAGNOSIS — I611 Nontraumatic intracerebral hemorrhage in hemisphere, cortical: Secondary | ICD-10-CM | POA: Diagnosis not present

## 2022-01-17 DIAGNOSIS — C3490 Malignant neoplasm of unspecified part of unspecified bronchus or lung: Secondary | ICD-10-CM | POA: Diagnosis not present

## 2022-01-17 DIAGNOSIS — N281 Cyst of kidney, acquired: Secondary | ICD-10-CM | POA: Diagnosis not present

## 2022-01-17 DIAGNOSIS — K5730 Diverticulosis of large intestine without perforation or abscess without bleeding: Secondary | ICD-10-CM | POA: Diagnosis not present

## 2022-01-17 MED ORDER — GADOBUTROL 1 MMOL/ML IV SOLN
6.0000 mL | Freq: Once | INTRAVENOUS | Status: AC | PRN
Start: 2022-01-17 — End: 2022-01-17
  Administered 2022-01-17: 6 mL via INTRAVENOUS

## 2022-01-18 DIAGNOSIS — C3490 Malignant neoplasm of unspecified part of unspecified bronchus or lung: Secondary | ICD-10-CM | POA: Diagnosis not present

## 2022-01-18 DIAGNOSIS — Z933 Colostomy status: Secondary | ICD-10-CM | POA: Diagnosis not present

## 2022-01-18 DIAGNOSIS — J910 Malignant pleural effusion: Secondary | ICD-10-CM | POA: Diagnosis not present

## 2022-01-19 ENCOUNTER — Encounter: Payer: Self-pay | Admitting: *Deleted

## 2022-01-22 ENCOUNTER — Inpatient Hospital Stay: Payer: Medicare Other

## 2022-01-22 ENCOUNTER — Encounter: Payer: Self-pay | Admitting: Internal Medicine

## 2022-01-22 ENCOUNTER — Inpatient Hospital Stay: Payer: Medicare Other | Admitting: Internal Medicine

## 2022-01-22 ENCOUNTER — Other Ambulatory Visit: Payer: Self-pay

## 2022-01-22 VITALS — BP 118/53 | HR 81 | Temp 97.1°F | Resp 18

## 2022-01-22 DIAGNOSIS — D500 Iron deficiency anemia secondary to blood loss (chronic): Secondary | ICD-10-CM | POA: Diagnosis not present

## 2022-01-22 DIAGNOSIS — C782 Secondary malignant neoplasm of pleura: Secondary | ICD-10-CM | POA: Diagnosis not present

## 2022-01-22 DIAGNOSIS — I509 Heart failure, unspecified: Secondary | ICD-10-CM | POA: Diagnosis not present

## 2022-01-22 DIAGNOSIS — I110 Hypertensive heart disease with heart failure: Secondary | ICD-10-CM | POA: Diagnosis not present

## 2022-01-22 DIAGNOSIS — N289 Disorder of kidney and ureter, unspecified: Secondary | ICD-10-CM | POA: Diagnosis not present

## 2022-01-22 DIAGNOSIS — C3492 Malignant neoplasm of unspecified part of left bronchus or lung: Secondary | ICD-10-CM

## 2022-01-22 DIAGNOSIS — C7951 Secondary malignant neoplasm of bone: Secondary | ICD-10-CM | POA: Diagnosis not present

## 2022-01-22 DIAGNOSIS — D649 Anemia, unspecified: Secondary | ICD-10-CM

## 2022-01-22 DIAGNOSIS — C778 Secondary and unspecified malignant neoplasm of lymph nodes of multiple regions: Secondary | ICD-10-CM | POA: Diagnosis not present

## 2022-01-22 DIAGNOSIS — J90 Pleural effusion, not elsewhere classified: Secondary | ICD-10-CM | POA: Diagnosis not present

## 2022-01-22 LAB — CBC WITH DIFFERENTIAL (CANCER CENTER ONLY)
Abs Immature Granulocytes: 0.04 10*3/uL (ref 0.00–0.07)
Basophils Absolute: 0 10*3/uL (ref 0.0–0.1)
Basophils Relative: 0 %
Eosinophils Absolute: 0.1 10*3/uL (ref 0.0–0.5)
Eosinophils Relative: 2 %
HCT: 29.2 % — ABNORMAL LOW (ref 36.0–46.0)
Hemoglobin: 9.3 g/dL — ABNORMAL LOW (ref 12.0–15.0)
Immature Granulocytes: 1 %
Lymphocytes Relative: 12 %
Lymphs Abs: 0.9 10*3/uL (ref 0.7–4.0)
MCH: 27.7 pg (ref 26.0–34.0)
MCHC: 31.8 g/dL (ref 30.0–36.0)
MCV: 86.9 fL (ref 80.0–100.0)
Monocytes Absolute: 0.7 10*3/uL (ref 0.1–1.0)
Monocytes Relative: 10 %
Neutro Abs: 5.5 10*3/uL (ref 1.7–7.7)
Neutrophils Relative %: 75 %
Platelet Count: 142 10*3/uL — ABNORMAL LOW (ref 150–400)
RBC: 3.36 MIL/uL — ABNORMAL LOW (ref 3.87–5.11)
RDW: 14.6 % (ref 11.5–15.5)
WBC Count: 7.2 10*3/uL (ref 4.0–10.5)
nRBC: 0 % (ref 0.0–0.2)

## 2022-01-22 LAB — CMP (CANCER CENTER ONLY)
ALT: 5 U/L (ref 0–44)
AST: 12 U/L — ABNORMAL LOW (ref 15–41)
Albumin: 3.7 g/dL (ref 3.5–5.0)
Alkaline Phosphatase: 53 U/L (ref 38–126)
Anion gap: 5 (ref 5–15)
BUN: 79 mg/dL — ABNORMAL HIGH (ref 8–23)
CO2: 29 mmol/L (ref 22–32)
Calcium: 9.6 mg/dL (ref 8.9–10.3)
Chloride: 103 mmol/L (ref 98–111)
Creatinine: 2.03 mg/dL — ABNORMAL HIGH (ref 0.44–1.00)
GFR, Estimated: 24 mL/min — ABNORMAL LOW (ref >60)
Glucose, Bld: 101 mg/dL — ABNORMAL HIGH (ref 70–99)
Potassium: 4 mmol/L (ref 3.5–5.1)
Sodium: 137 mmol/L (ref 135–145)
Total Bilirubin: 0.3 mg/dL (ref 0.3–1.2)
Total Protein: 7.1 g/dL (ref 6.5–8.1)

## 2022-01-22 LAB — SAMPLE TO BLOOD BANK

## 2022-02-06 ENCOUNTER — Ambulatory Visit (INDEPENDENT_AMBULATORY_CARE_PROVIDER_SITE_OTHER): Payer: Medicare Other | Admitting: Internal Medicine

## 2022-02-06 ENCOUNTER — Encounter: Payer: Self-pay | Admitting: Internal Medicine

## 2022-02-06 VITALS — BP 140/86 | HR 50 | Temp 98.0°F | Ht 60.0 in | Wt 157.0 lb

## 2022-02-06 DIAGNOSIS — D510 Vitamin B12 deficiency anemia due to intrinsic factor deficiency: Secondary | ICD-10-CM | POA: Diagnosis not present

## 2022-02-06 DIAGNOSIS — M1030 Gout due to renal impairment, unspecified site: Secondary | ICD-10-CM | POA: Diagnosis not present

## 2022-02-06 DIAGNOSIS — I4891 Unspecified atrial fibrillation: Secondary | ICD-10-CM | POA: Diagnosis not present

## 2022-02-06 DIAGNOSIS — Z0001 Encounter for general adult medical examination with abnormal findings: Secondary | ICD-10-CM | POA: Diagnosis not present

## 2022-02-06 DIAGNOSIS — D508 Other iron deficiency anemias: Secondary | ICD-10-CM

## 2022-02-06 LAB — CBC WITH DIFFERENTIAL/PLATELET
Basophils Absolute: 0 10*3/uL (ref 0.0–0.1)
Basophils Relative: 0.4 % (ref 0.0–3.0)
Eosinophils Absolute: 0.1 10*3/uL (ref 0.0–0.7)
Eosinophils Relative: 1.5 % (ref 0.0–5.0)
HCT: 29.6 % — ABNORMAL LOW (ref 36.0–46.0)
Hemoglobin: 9.6 g/dL — ABNORMAL LOW (ref 12.0–15.0)
Lymphocytes Relative: 14.6 % (ref 12.0–46.0)
Lymphs Abs: 1.1 10*3/uL (ref 0.7–4.0)
MCHC: 32.5 g/dL (ref 30.0–36.0)
MCV: 83.5 fl (ref 78.0–100.0)
Monocytes Absolute: 0.9 10*3/uL (ref 0.1–1.0)
Monocytes Relative: 11.2 % (ref 3.0–12.0)
Neutro Abs: 5.6 10*3/uL (ref 1.4–7.7)
Neutrophils Relative %: 72.3 % (ref 43.0–77.0)
Platelets: 136 10*3/uL — ABNORMAL LOW (ref 150.0–400.0)
RBC: 3.54 Mil/uL — ABNORMAL LOW (ref 3.87–5.11)
RDW: 14.4 % (ref 11.5–15.5)
WBC: 7.7 10*3/uL (ref 4.0–10.5)

## 2022-02-06 LAB — IBC + FERRITIN
Ferritin: 41.1 ng/mL (ref 10.0–291.0)
Iron: 25 ug/dL — ABNORMAL LOW (ref 42–145)
Saturation Ratios: 7 % — ABNORMAL LOW (ref 20.0–50.0)
TIBC: 355.6 ug/dL (ref 250.0–450.0)
Transferrin: 254 mg/dL (ref 212.0–360.0)

## 2022-02-06 LAB — FOLATE: Folate: 11.4 ng/mL (ref >5.9)

## 2022-02-06 LAB — VITAMIN B12: Vitamin B-12: 552 pg/mL (ref 211–911)

## 2022-02-06 LAB — TSH: TSH: 1.92 u[IU]/mL (ref 0.35–5.50)

## 2022-02-06 MED ORDER — ALLOPURINOL 100 MG PO TABS: 100.0000 mg | ORAL_TABLET | Freq: Every day | ORAL | 1 refills | Status: AC

## 2022-02-06 NOTE — Progress Notes (Signed)
Subjective:  Patient ID: [name], female    DOB: November 21, 1940  Age: 81 y.o. MRN: [id number]  CC: Annual Exam and Anemia   HPI SHARADA ALBORNOZ presents for a CPX and f/up -   She complains of a several week history of elevated heart rate, shortness of breath, and dyspnea on exertion.  She denies chest pain.  She has had a few episodes of dizziness and lightheadedness but denies near syncope.  Outpatient Medications Prior to Visit  Medication Sig Dispense Refill   acetaminophen (TYLENOL) 325 MG tablet Take 325-650 mg by mouth daily as needed for moderate pain, fever or headache.      albuterol (PROAIR HFA) 108 (90 Base) MCG/ACT inhaler Inhale 1-2 puffs into the lungs every 6 (six) hours as needed for wheezing or shortness of breath. 18 g 11   clonazePAM (KLONOPIN) 1 MG tablet Take 1 tablet (1 mg total) by mouth 2 (two) times daily as needed for anxiety. (Patient taking differently: Take 0.5-1 mg by mouth 2 (two) times daily as needed for anxiety.) 180 tablet 0   cyanocobalamin 2000 MCG tablet Take 1 tablet (2,000 mcg total) by mouth daily. 90 tablet 3   ferrous sulfate 325 (65 FE) MG tablet Take 1 tablet (325 mg total) by mouth 2 (two) times daily with a meal. 180 tablet 1   fluticasone furoate-vilanterol (BREO ELLIPTA) 100-25 MCG/ACT AEPB Inhale 1 puff into the lungs daily. 120 each 1   isosorbide-hydrALAZINE (BIDIL) 20-37.5 MG tablet TAKE 1 TABLET BY MOUTH THREE TIMES DAILY (Patient taking differently: Take 1 tablet by mouth in the morning and at bedtime.) 270 tablet 0   metoprolol tartrate (LOPRESSOR) 25 MG tablet TAKE 1 TABLET(25 MG) BY MOUTH TWICE DAILY 180 tablet 1   morphine (MS CONTIN) 15 MG 12 hr tablet Take 1 tablet (15 mg total) by mouth 2 (two) times daily as needed for pain. 60 tablet 0   Nutritional Supplements (CARNATION BREAKFAST ESSENTIALS) LIQD Take 237 mLs by mouth daily.     nystatin (MYCOSTATIN/NYSTOP) powder Apply 1 application. topically 3 (three) times  daily. 60 g 0   OXYGEN Inhale 2 L/min into the lungs continuous.     prochlorperazine (COMPAZINE) 10 MG tablet Take 1 tablet (10 mg total) by mouth every 6 (six) hours as needed. (Patient taking differently: Take 10 mg by mouth every 6 (six) hours as needed for nausea or vomiting.) 30 tablet 2   protein supplement shake (PREMIER PROTEIN) LIQD Take 11 oz by mouth 2 (two) times daily between meals.     torsemide (DEMADEX) 20 MG tablet TAKE 2 TABLETS(40 MG) BY MOUTH DAILY 180 tablet 1   allopurinol (ZYLOPRIM) 100 MG tablet TAKE 1 TABLET(100 MG) BY MOUTH DAILY (Patient taking differently: Take 100 mg by mouth daily.) 90 tablet 1   osimertinib mesylate (TAGRISSO) 80 MG tablet Take 1 tablet (80 mg total) by mouth daily. 30 tablet 3   cyanocobalamin ((VITAMIN B-12)) injection 1,000 mcg      No facility-administered medications prior to visit.    ROS Review of Systems  Constitutional:  Positive for fatigue. Negative for appetite change, chills, diaphoresis, fever and unexpected weight change.  Respiratory:  Positive for shortness of breath. Negative for cough, chest tightness, wheezing and stridor.   Cardiovascular:  Positive for palpitations. Negative for chest pain and leg swelling.  Gastrointestinal: Negative.  Negative for abdominal pain, blood in stool, constipation, diarrhea, nausea and vomiting.  Genitourinary: Negative.  Negative for  difficulty urinating.  Musculoskeletal: Negative.   Skin: Negative.   Neurological:  Positive for dizziness and light-headedness. Negative for weakness.  Hematological:  Negative for adenopathy. Does not bruise/bleed easily.  Psychiatric/Behavioral: Negative.      Objective:  BP 140/86 (BP Location: Left Arm, Patient Position: Sitting, Cuff Size: Large)   Pulse (!) 50   Temp 98 F (36.7 C) (Oral)   Ht 5' (1.524 m)   Wt 157 lb (71.2 kg)   SpO2 96%   BMI 30.66 kg/m   BP Readings from Last 3 Encounters:  02/06/22 140/86  01/22/22 (!) 118/53  12/27/21  (!) 162/54    Wt Readings from Last 3 Encounters:  02/06/22 157 lb (71.2 kg)  12/06/21 162 lb (73.5 kg)  12/05/21 162 lb 12.8 oz (73.8 kg)    Physical Exam Vitals reviewed.  Constitutional:      General: She is not in acute distress.    Appearance: She is ill-appearing. She is not toxic-appearing or diaphoretic.  HENT:     Nose: Nose normal.     Mouth/Throat:     Mouth: Mucous membranes are moist.  Eyes:     General: No scleral icterus.    Conjunctiva/sclera: Conjunctivae normal.  Cardiovascular:     Rate and Rhythm: Normal rate. Rhythm irregularly irregular.     Heart sounds: Normal heart sounds, S1 normal and S2 normal.     No friction rub. No gallop.     Comments: EKG- A fib, 98 bpm Anterior infract pattern- not new  Pulmonary:     Effort: Pulmonary effort is normal.     Breath sounds: Examination of the left-middle field reveals decreased breath sounds. Examination of the left-lower field reveals decreased breath sounds. Decreased breath sounds present. No wheezing, rhonchi or rales.  Abdominal:     General: Abdomen is flat.     Palpations: There is no mass.     Tenderness: There is no abdominal tenderness. There is no guarding or rebound.     Hernia: No hernia is present.  Musculoskeletal:     Cervical back: Neck supple.     Right lower leg: No edema.     Left lower leg: No edema.  Lymphadenopathy:     Cervical: No cervical adenopathy.  Skin:    General: Skin is warm and dry.     Findings: Rash present.  Neurological:     General: No focal deficit present.     Mental Status: She is alert. Mental status is at baseline.  Psychiatric:        Mood and Affect: Mood normal.        Behavior: Behavior normal.     Lab Results  Component Value Date   WBC 7.7 02/06/2022   HGB 9.6 (L) 02/06/2022   HCT 29.6 (L) 02/06/2022   PLT 136.0 (L) 02/06/2022   GLUCOSE 101 (H) 01/22/2022   CHOL 165 02/04/2020   TRIG 162 (H) 02/04/2020   HDL 49 (L) 02/04/2020   LDLDIRECT  107.0 09/04/2016   LDLCALC 90 02/04/2020   ALT 5 01/22/2022   AST 12 (L) 01/22/2022   NA 137 01/22/2022   K 4.0 01/22/2022   CL 103 01/22/2022   CREATININE 2.03 (H) 01/22/2022   BUN 79 (H) 01/22/2022   CO2 29 01/22/2022   TSH 1.92 02/06/2022   INR 1.1 11/23/2021   HGBA1C 5.4 11/05/2017   MICROALBUR <0.7 11/05/2017    CT Abdomen Pelvis Wo Contrast  Result Date: 01/18/2022 CLINICAL DATA:  Lung cancer restaging. * Tracking Code: BO * EXAM: CT CHEST, ABDOMEN AND PELVIS WITHOUT CONTRAST TECHNIQUE: Multidetector CT imaging of the chest, abdomen and pelvis was performed following the standard protocol without IV contrast. RADIATION DOSE REDUCTION: This exam was performed according to the departmental dose-optimization program which includes automated exposure control, adjustment of the mA and/or kV according to patient size and/or use of iterative reconstruction technique. COMPARISON:  Abdomen and pelvis CT 11/22/2021.  Chest CT 11/09/2021 FINDINGS: CT CHEST FINDINGS Cardiovascular: The heart size is upper normal to borderline increased for size. No substantial pericardial effusion. Coronary artery calcification is evident. Moderate atherosclerotic calcification is noted in the wall of the thoracic aorta. Mediastinum/Nodes: 13 mm subcarinal lymph node on 27/2 was 14 mm short axis previously. No other mediastinal lymphadenopathy. No evidence for gross hilar lymphadenopathy although assessment is limited by the lack of intravenous contrast on the current study. The esophagus has normal imaging features. There is no axillary lymphadenopathy. Lungs/Pleura: Left greater than right biapical pleuroparenchymal scarring is similar to prior. Centrilobular emphsyema noted. 5 mm ground-glass nodule in the peripheral right upper lobe (71/4) is not substantially changed in the interval. Tiny peripheral left upper lobe pulmonary nodules measure less than 3 mm, unchanged since prior. Small left pleural effusion with  fluid in the left major fissure, decreased in the interval. Stable appearance dependent left lower lobe atelectasis. Musculoskeletal: No worrisome lytic or sclerotic osseous abnormality. CT ABDOMEN PELVIS FINDINGS Hepatobiliary: No suspicious focal abnormality in the liver on this study without intravenous contrast. There is no evidence for gallstones, gallbladder wall thickening, or pericholecystic fluid. No intrahepatic or extrahepatic biliary dilation. Pancreas: No focal mass lesion. No dilatation of the main duct. No intraparenchymal cyst. No peripancreatic edema. Spleen: No splenomegaly. No focal mass lesion. Adrenals/Urinary Tract: No adrenal nodule or mass. Stable mixed attenuation lesions in both kidneys. Dominant lesion in the right kidney measures 3.9 cm, similar and represents the lesion demonstrating enhancement on prior imaging. Some of the smaller lesions in both kidneys have attenuation too high to be simple cysts and appear similar to prior, potentially representing proteinaceous or hemorrhagic cyst. No evidence for hydroureter. The urinary bladder appears normal for the degree of distention. Stomach/Bowel: Stomach is unremarkable. No gastric wall thickening. No evidence of outlet obstruction. Duodenum is normally positioned as is the ligament of Treitz. No small bowel wall thickening. No small bowel dilatation. The terminal ileum is normal. No gross colonic mass. No colonic wall thickening. Transverse end colostomy noted. There is a right paramidline ventral hernia containing the cecum in small bowel loops. Parastomal herniation contains the blind loop of the Hartmann's pouch anatomy. Diverticular disease noted left colon without diverticulitis. Vascular/Lymphatic: There is advanced atherosclerotic calcification of the abdominal aorta without aneurysm. There is no gastrohepatic or hepatoduodenal ligament lymphadenopathy. No retroperitoneal or mesenteric lymphadenopathy. No pelvic sidewall  lymphadenopathy. Reproductive: The uterus is unremarkable.  There is no adnexal mass. Other: No intraperitoneal free fluid. Musculoskeletal: No worrisome lytic or sclerotic osseous abnormality. Superior endplate compression deformity noted at L3 and L4, similar to prior. IMPRESSION: 1. Stable exam. No findings to suggest new metastatic disease in the chest, abdomen, or pelvis. 2. Small left pleural effusion with fluid in the left major fissure, decreased in the interval. 3. Stable pulmonary nodules. 4. Transverse end colostomy with right paramidline and parastomal hernias containing small bowel and colon. No complicating features. 5. Bilateral renal lesions including dominant posterior right interpolar renal lesion shown previously to demonstrate enhancement. As such, this  remains concerning for neoplasm but shows no substantial change in the interval. 6. Aortic Atherosclerosis (ICD10-I70.0) and Emphysema (ICD10-J43.9). Electronically Signed   By: Misty Stanley M.D.   On: 01/18/2022 17:13   CT Chest Wo Contrast  Result Date: 01/18/2022 CLINICAL DATA:  Lung cancer restaging. * Tracking Code: BO * EXAM: CT CHEST, ABDOMEN AND PELVIS WITHOUT CONTRAST TECHNIQUE: Multidetector CT imaging of the chest, abdomen and pelvis was performed following the standard protocol without IV contrast. RADIATION DOSE REDUCTION: This exam was performed according to the departmental dose-optimization program which includes automated exposure control, adjustment of the mA and/or kV according to patient size and/or use of iterative reconstruction technique. COMPARISON:  Abdomen and pelvis CT 11/22/2021.  Chest CT 11/09/2021 FINDINGS: CT CHEST FINDINGS Cardiovascular: The heart size is upper normal to borderline increased for size. No substantial pericardial effusion. Coronary artery calcification is evident. Moderate atherosclerotic calcification is noted in the wall of the thoracic aorta. Mediastinum/Nodes: 13 mm subcarinal lymph node on  27/2 was 14 mm short axis previously. No other mediastinal lymphadenopathy. No evidence for gross hilar lymphadenopathy although assessment is limited by the lack of intravenous contrast on the current study. The esophagus has normal imaging features. There is no axillary lymphadenopathy. Lungs/Pleura: Left greater than right biapical pleuroparenchymal scarring is similar to prior. Centrilobular emphsyema noted. 5 mm ground-glass nodule in the peripheral right upper lobe (71/4) is not substantially changed in the interval. Tiny peripheral left upper lobe pulmonary nodules measure less than 3 mm, unchanged since prior. Small left pleural effusion with fluid in the left major fissure, decreased in the interval. Stable appearance dependent left lower lobe atelectasis. Musculoskeletal: No worrisome lytic or sclerotic osseous abnormality. CT ABDOMEN PELVIS FINDINGS Hepatobiliary: No suspicious focal abnormality in the liver on this study without intravenous contrast. There is no evidence for gallstones, gallbladder wall thickening, or pericholecystic fluid. No intrahepatic or extrahepatic biliary dilation. Pancreas: No focal mass lesion. No dilatation of the main duct. No intraparenchymal cyst. No peripancreatic edema. Spleen: No splenomegaly. No focal mass lesion. Adrenals/Urinary Tract: No adrenal nodule or mass. Stable mixed attenuation lesions in both kidneys. Dominant lesion in the right kidney measures 3.9 cm, similar and represents the lesion demonstrating enhancement on prior imaging. Some of the smaller lesions in both kidneys have attenuation too high to be simple cysts and appear similar to prior, potentially representing proteinaceous or hemorrhagic cyst. No evidence for hydroureter. The urinary bladder appears normal for the degree of distention. Stomach/Bowel: Stomach is unremarkable. No gastric wall thickening. No evidence of outlet obstruction. Duodenum is normally positioned as is the ligament of Treitz.  No small bowel wall thickening. No small bowel dilatation. The terminal ileum is normal. No gross colonic mass. No colonic wall thickening. Transverse end colostomy noted. There is a right paramidline ventral hernia containing the cecum in small bowel loops. Parastomal herniation contains the blind loop of the Hartmann's pouch anatomy. Diverticular disease noted left colon without diverticulitis. Vascular/Lymphatic: There is advanced atherosclerotic calcification of the abdominal aorta without aneurysm. There is no gastrohepatic or hepatoduodenal ligament lymphadenopathy. No retroperitoneal or mesenteric lymphadenopathy. No pelvic sidewall lymphadenopathy. Reproductive: The uterus is unremarkable.  There is no adnexal mass. Other: No intraperitoneal free fluid. Musculoskeletal: No worrisome lytic or sclerotic osseous abnormality. Superior endplate compression deformity noted at L3 and L4, similar to prior. IMPRESSION: 1. Stable exam. No findings to suggest new metastatic disease in the chest, abdomen, or pelvis. 2. Small left pleural effusion with fluid in the left major  fissure, decreased in the interval. 3. Stable pulmonary nodules. 4. Transverse end colostomy with right paramidline and parastomal hernias containing small bowel and colon. No complicating features. 5. Bilateral renal lesions including dominant posterior right interpolar renal lesion shown previously to demonstrate enhancement. As such, this remains concerning for neoplasm but shows no substantial change in the interval. 6. Aortic Atherosclerosis (ICD10-I70.0) and Emphysema (ICD10-J43.9). Electronically Signed   By: Misty Stanley M.D.   On: 01/18/2022 17:13   MR Brain W Wo Contrast  Result Date: 01/18/2022 CLINICAL DATA:  Lung cancer, evaluate for metastatic disease EXAM: MRI HEAD WITHOUT AND WITH CONTRAST TECHNIQUE: Multiplanar, multiecho pulse sequences of the brain and surrounding structures were obtained without and with intravenous  contrast. CONTRAST:  89m GADAVIST GADOBUTROL 1 MMOL/ML IV SOLN COMPARISON:  MR head 09/08/2021 FINDINGS: Brain: There is no acute intracranial hemorrhage, extra-axial fluid collection, or acute infarct The ventricles are stable in size and configuration patchy FLAIR signal abnormality in the subcortical and periventricular white matter likely reflecting sequela of mild chronic white matter microangiopathy is unchanged. A single punctate chronic microhemorrhage in the left frontal lobe is unchanged, nonspecific. There is no suspicious parenchymal signal abnormality. There is no mass lesion. There is no abnormal enhancement. There is no mass effect or midline shift. Vascular: Normal flow voids. Skull and upper cervical spine: Normal marrow signal. Sinuses/Orbits: The paranasal sinuses are clear. The globes and orbits are unremarkable. Other: None. IMPRESSION: No evidence of intracranial metastatic disease. Electronically Signed   By: PValetta MoleM.D.   On: 01/18/2022 08:48    Assessment & Plan:   BShaninawas seen today for annual exam and anemia.  Diagnoses and all orders for this visit:  Encounter for general adult medical examination with abnormal findings- Exam completed, labs reviewed, she deferred on the shingles vaccine, statin therapy is not indicated, no cancer screenings indicated, patient education was given.  Gout due to renal impairment, unspecified chronicity, unspecified site -     allopurinol (ZYLOPRIM) 100 MG tablet; Take 1 tablet (100 mg total) by mouth daily.  Atrial fibrillation with RVR ([place]- Will continue the beta-blocker. -     TSH; Future -     TSH  Other iron deficiency anemia- This is likely contributing to her symptoms.  I have recommended that she receive a series of iron infusions. -     IBC + Ferritin; Future -     CBC with Differential/Platelet; Future -     CBC with Differential/Platelet -     IBC + Ferritin -     Ambulatory referral to Hematology /  Oncology  Vitamin B12 deficiency anemia due to intrinsic factor deficiency- Her B12 is normal. -     CBC with Differential/Platelet; Future -     Folate; Future -     Vitamin B12; Future -     Vitamin B12 -     Folate -     CBC with Differential/Platelet   I have changed BHorris LatinoJ. Speciale's allopurinol. I am also having her maintain her albuterol, acetaminophen, ferrous sulfate, isosorbide-hydrALAZINE, clonazePAM, prochlorperazine, morphine, fluticasone furoate-vilanterol, OXYGEN, protein supplement shake, Carnation Breakfast Essentials, nystatin, metoprolol tartrate, torsemide, and cyanocobalamin. We will stop administering cyanocobalamin.  Meds ordered this encounter  Medications   allopurinol (ZYLOPRIM) 100 MG tablet    Sig: Take 1 tablet (100 mg total) by mouth daily.    Dispense:  90 tablet    Refill:  1     Follow-up: Return in about 3  months (around 05/09/2022).  [name], MD

## 2022-02-06 NOTE — Patient Instructions (Signed)
Anemia  Anemia is a condition in which there is not enough red blood cells or hemoglobin in the blood. Hemoglobin is a substance in red blood cells that carries oxygen. When you do not have enough red blood cells or hemoglobin (are anemic), your body cannot get enough oxygen and your organs may not work properly. As a result, you may feel very tired or have other problems. What are the causes? Common causes of anemia include: Excessive bleeding. Anemia can be caused by excessive bleeding inside or outside the body, including bleeding from the intestines or from heavy menstrual periods in females. Poor nutrition. Long-lasting (chronic) kidney, thyroid, and liver disease. Bone marrow disorders, spleen problems, and blood disorders. Cancer and treatments for cancer. HIV (human immunodeficiency virus) and AIDS (acquired immunodeficiency syndrome). Infections, medicines, and autoimmune disorders that destroy red blood cells. What are the signs or symptoms? Symptoms of this condition include: Minor weakness. Dizziness. Headache, or difficulties concentrating and sleeping. Heartbeats that feel irregular or faster than normal (palpitations). Shortness of breath, especially with exercise. Pale skin, lips, and nails, or cold hands and feet. Indigestion and nausea. Symptoms may occur suddenly or develop slowly. If your anemia is mild, you may not have symptoms. How is this diagnosed? This condition is diagnosed based on blood tests, your medical history, and a physical exam. In some cases, a test may be needed in which cells are removed from the soft tissue inside of a bone and looked at under a microscope (bone marrow biopsy). Your health care provider may also check your stool (feces) for blood and may do additional testing to look for the cause of your bleeding. Other tests may include: Imaging tests, such as a CT scan or MRI. A procedure to see inside your esophagus and stomach (endoscopy). A  procedure to see inside your colon and rectum (colonoscopy). How is this treated? Treatment for this condition depends on the cause. If you continue to lose a lot of blood, you may need to be treated at a hospital. Treatment may include: Taking supplements of iron, vitamin B12, or folic acid. Taking a hormone medicine (erythropoietin) that can help to stimulate red blood cell growth. Having a blood transfusion. This may be needed if you lose a lot of blood. Making changes to your diet. Having surgery to remove your spleen. Follow these instructions at home: Take over-the-counter and prescription medicines only as told by your health care provider. Take supplements only as told by your health care provider. Follow any diet instructions that you were given by your health care provider. Keep all follow-up visits as told by your health care provider. This is important. Contact a health care provider if: You develop new bleeding anywhere in the body. Get help right away if: You are very weak. You are short of breath. You have pain in your abdomen or chest. You are dizzy or feel faint. You have trouble concentrating. You have bloody stools, black stools, or tarry stools. You vomit repeatedly or you vomit up blood. These symptoms may represent a serious problem that is an emergency. Do not wait to see if the symptoms will go away. Get medical help right away. Call your local emergency services (911 in the [place] Do not drive yourself to the hospital. Summary Anemia is a condition in which you do not have enough red blood cells or enough of a substance in your red blood cells that carries oxygen (hemoglobin). Symptoms may occur suddenly or develop slowly. If your anemia   is mild, you may not have symptoms. This condition is diagnosed with blood tests, a medical history, and a physical exam. Other tests may be needed. Treatment for this condition depends on the cause of the anemia. This  information is not intended to replace advice given to you by your health care provider. Make sure you discuss any questions you have with your health care provider. Document Revised: 05/30/2021 Document Reviewed: 06/23/2019 Elsevier Patient Education  2023 Elsevier Inc.  

## 2022-02-07 ENCOUNTER — Other Ambulatory Visit: Payer: Self-pay | Admitting: Medical Oncology

## 2022-02-07 DIAGNOSIS — C3492 Malignant neoplasm of unspecified part of left bronchus or lung: Secondary | ICD-10-CM

## 2022-02-07 MED ORDER — OSIMERTINIB MESYLATE 80 MG PO TABS: 80.0000 mg | ORAL_TABLET | Freq: Every day | ORAL | 3 refills | Status: DC

## 2022-02-07 NOTE — Telephone Encounter (Signed)
Tagrisso refill requested-

## 2022-02-13 ENCOUNTER — Telehealth: Payer: Self-pay

## 2022-02-13 ENCOUNTER — Other Ambulatory Visit: Payer: Self-pay | Admitting: Physician Assistant

## 2022-02-13 DIAGNOSIS — C3492 Malignant neoplasm of unspecified part of left bronchus or lung: Secondary | ICD-10-CM

## 2022-02-13 MED ORDER — OSIMERTINIB MESYLATE 80 MG PO TABS: 80.0000 mg | ORAL_TABLET | Freq: Every day | ORAL | 3 refills | Status: DC

## 2022-02-13 NOTE — Telephone Encounter (Signed)
Pt called requesting refill on Tagrisso.  Routed to provider.

## 2022-02-17 DIAGNOSIS — Z933 Colostomy status: Secondary | ICD-10-CM | POA: Diagnosis not present

## 2022-02-17 DIAGNOSIS — C3490 Malignant neoplasm of unspecified part of unspecified bronchus or lung: Secondary | ICD-10-CM | POA: Diagnosis not present

## 2022-02-17 DIAGNOSIS — J910 Malignant pleural effusion: Secondary | ICD-10-CM | POA: Diagnosis not present

## 2022-02-19 ENCOUNTER — Other Ambulatory Visit: Payer: Self-pay

## 2022-02-19 NOTE — Addendum Note (Signed)
Addended by: Hinda Kehr on: 02/19/2022 09:13 AM   Modules accepted: Orders

## 2022-02-21 ENCOUNTER — Other Ambulatory Visit: Payer: Self-pay | Admitting: Pharmacy Technician

## 2022-02-21 ENCOUNTER — Inpatient Hospital Stay: Payer: Medicare Other

## 2022-02-21 ENCOUNTER — Other Ambulatory Visit: Payer: Self-pay

## 2022-02-21 ENCOUNTER — Telehealth: Payer: Self-pay | Admitting: Pharmacy Technician

## 2022-02-21 ENCOUNTER — Inpatient Hospital Stay: Payer: Medicare Other | Attending: Internal Medicine | Admitting: Internal Medicine

## 2022-02-21 ENCOUNTER — Encounter: Payer: Self-pay | Admitting: Dietician

## 2022-02-21 VITALS — BP 119/57 | HR 89 | Temp 97.6°F | Resp 18

## 2022-02-21 DIAGNOSIS — M109 Gout, unspecified: Secondary | ICD-10-CM | POA: Diagnosis not present

## 2022-02-21 DIAGNOSIS — R5383 Other fatigue: Secondary | ICD-10-CM | POA: Diagnosis not present

## 2022-02-21 DIAGNOSIS — M2550 Pain in unspecified joint: Secondary | ICD-10-CM | POA: Insufficient documentation

## 2022-02-21 DIAGNOSIS — Z885 Allergy status to narcotic agent status: Secondary | ICD-10-CM | POA: Diagnosis not present

## 2022-02-21 DIAGNOSIS — I509 Heart failure, unspecified: Secondary | ICD-10-CM | POA: Diagnosis not present

## 2022-02-21 DIAGNOSIS — M6281 Muscle weakness (generalized): Secondary | ICD-10-CM | POA: Insufficient documentation

## 2022-02-21 DIAGNOSIS — Z79899 Other long term (current) drug therapy: Secondary | ICD-10-CM | POA: Diagnosis not present

## 2022-02-21 DIAGNOSIS — Z886 Allergy status to analgesic agent status: Secondary | ICD-10-CM | POA: Diagnosis not present

## 2022-02-21 DIAGNOSIS — K922 Gastrointestinal hemorrhage, unspecified: Secondary | ICD-10-CM | POA: Insufficient documentation

## 2022-02-21 DIAGNOSIS — C3492 Malignant neoplasm of unspecified part of left bronchus or lung: Secondary | ICD-10-CM | POA: Diagnosis not present

## 2022-02-21 DIAGNOSIS — E538 Deficiency of other specified B group vitamins: Secondary | ICD-10-CM | POA: Insufficient documentation

## 2022-02-21 DIAGNOSIS — J449 Chronic obstructive pulmonary disease, unspecified: Secondary | ICD-10-CM | POA: Insufficient documentation

## 2022-02-21 DIAGNOSIS — Z8719 Personal history of other diseases of the digestive system: Secondary | ICD-10-CM | POA: Insufficient documentation

## 2022-02-21 DIAGNOSIS — R0609 Other forms of dyspnea: Secondary | ICD-10-CM | POA: Diagnosis not present

## 2022-02-21 DIAGNOSIS — I4891 Unspecified atrial fibrillation: Secondary | ICD-10-CM | POA: Diagnosis not present

## 2022-02-21 DIAGNOSIS — Z880 Allergy status to penicillin: Secondary | ICD-10-CM | POA: Diagnosis not present

## 2022-02-21 DIAGNOSIS — D500 Iron deficiency anemia secondary to blood loss (chronic): Secondary | ICD-10-CM | POA: Diagnosis not present

## 2022-02-21 DIAGNOSIS — Z888 Allergy status to other drugs, medicaments and biological substances status: Secondary | ICD-10-CM | POA: Diagnosis not present

## 2022-02-21 DIAGNOSIS — R0602 Shortness of breath: Secondary | ICD-10-CM | POA: Insufficient documentation

## 2022-02-21 DIAGNOSIS — C7951 Secondary malignant neoplasm of bone: Secondary | ICD-10-CM | POA: Insufficient documentation

## 2022-02-21 DIAGNOSIS — I110 Hypertensive heart disease with heart failure: Secondary | ICD-10-CM | POA: Insufficient documentation

## 2022-02-21 LAB — CBC WITH DIFFERENTIAL (CANCER CENTER ONLY)
Abs Immature Granulocytes: 0.02 10*3/uL (ref 0.00–0.07)
Basophils Absolute: 0 10*3/uL (ref 0.0–0.1)
Basophils Relative: 0 %
Eosinophils Absolute: 0.1 10*3/uL (ref 0.0–0.5)
Eosinophils Relative: 2 %
HCT: 30.5 % — ABNORMAL LOW (ref 36.0–46.0)
Hemoglobin: 9.5 g/dL — ABNORMAL LOW (ref 12.0–15.0)
Immature Granulocytes: 0 %
Lymphocytes Relative: 10 %
Lymphs Abs: 0.6 10*3/uL — ABNORMAL LOW (ref 0.7–4.0)
MCH: 26.2 pg (ref 26.0–34.0)
MCHC: 31.1 g/dL (ref 30.0–36.0)
MCV: 84 fL (ref 80.0–100.0)
Monocytes Absolute: 0.6 10*3/uL (ref 0.1–1.0)
Monocytes Relative: 9 %
Neutro Abs: 4.8 10*3/uL (ref 1.7–7.7)
Neutrophils Relative %: 79 %
Platelet Count: 120 10*3/uL — ABNORMAL LOW (ref 150–400)
RBC: 3.63 MIL/uL — ABNORMAL LOW (ref 3.87–5.11)
RDW: 13.5 % (ref 11.5–15.5)
WBC Count: 6.1 10*3/uL (ref 4.0–10.5)
nRBC: 0 % (ref 0.0–0.2)

## 2022-02-21 LAB — CMP (CANCER CENTER ONLY)
ALT: <5 U/L (ref 0–44)
AST: 10 U/L — ABNORMAL LOW (ref 15–41)
Albumin: 3.6 g/dL (ref 3.5–5.0)
Alkaline Phosphatase: 50 U/L (ref 38–126)
Anion gap: 4 — ABNORMAL LOW (ref 5–15)
BUN: 38 mg/dL — ABNORMAL HIGH (ref 8–23)
CO2: 33 mmol/L — ABNORMAL HIGH (ref 22–32)
Calcium: 9.5 mg/dL (ref 8.9–10.3)
Chloride: 100 mmol/L (ref 98–111)
Creatinine: 1.81 mg/dL — ABNORMAL HIGH (ref 0.44–1.00)
GFR, Estimated: 28 mL/min — ABNORMAL LOW (ref >60)
Glucose, Bld: 127 mg/dL — ABNORMAL HIGH (ref 70–99)
Potassium: 4 mmol/L (ref 3.5–5.1)
Sodium: 137 mmol/L (ref 135–145)
Total Bilirubin: 0.3 mg/dL (ref 0.3–1.2)
Total Protein: 7.4 g/dL (ref 6.5–8.1)

## 2022-02-21 LAB — SAMPLE TO BLOOD BANK

## 2022-02-21 NOTE — Progress Notes (Signed)
[facility] Telephone:([phone]) [phone]   Fax:(336) [phone]  OFFICE PROGRESS NOTE  [name], MD [place] [address]  DIAGNOSIS: Stage IV (Tx, N3, M1C) non-small cell lung cancer, adenocarcinoma. She presented with a left primary lung lesion and widespread metastatic disease in the thoracic nodes, left pleural space, abdomen pelvic lymph nodes, and bones.  She was diagnosed in October 2022.  Molecular studies: The patient has positive EGFR mutation in exon 21 (L858R)   PRIOR THERAPY:     CURRENT THERAPY: Targeted treatment with Tagrisso 80 mg p.o. daily.  First dose started on June 21, 2021.  Status post 9 months.  INTERVAL HISTORY: [name] 81 y.o. female returns to the clinic today for follow-up visit accompanied by her daughter.  The patient is feeling fine today with no concerning complaints except for fatigue.  She also has shortness of breath with exertion but no significant chest pain, cough or hemoptysis.  She has no nausea, vomiting, diarrhea or constipation.  She has no headache or visual changes.  She continues to have intermittent gastrointestinal blood loss.  She also continues to tolerate her treatment with Tagrisso fairly well.  The patient is here today for evaluation and repeat blood work.   MEDICAL HISTORY: Past Medical History:  Diagnosis Date   Allergy    Angiodysplasia of cecum    Angiodysplasia of duodenum    Arthritis    Asteatotic eczema 02/25/2016   Asthma    Atrial fibrillation ([place]) 10/17/2017   Blood transfusion without reported diagnosis    Cataract    CHF (congestive heart failure), NYHA class III, chronic, combined ([place]) 08/10/2017   Colonic polyp 02/16/2008   Tubular adenoma   COPD (chronic obstructive pulmonary disease) ([place]) 01/27/1998   Cor pulmonale ([place]) 11/16/2014   GERD 07/30/1992   Gout    HTN (hypertension) 07/30/1988   Hyperlipidemia with target LDL less than 100 11/27/1996         Kidney stone 1960, 1972, 1991   lung ca 04/2021   Medical history non-contributory    MGUS (monoclonal gammopathy of unknown significance) 11/08/2016   Morbid obesity ([place]) 04/19/2010   She agrees to work on her lifestyle modifications to help her lose weight.    OSA (obstructive sleep apnea) 06/22/2013   Osteopenia, senile 02/05/2013   July 2014  -2.1 left femur -1.9 left forearm    Prediabetes 11/13/2006   Renal insufficiency    Stenosis of cervical spine with myelopathy ([place]) 01/01/2018   Supplemental oxygen dependent    2L via [place]   Ulcer of leg, chronic, right ([place]) 10/10/2011   ULCERATIVE COLITIS-LEFT SIDE 03/19/2008        Vitamin B12 deficiency anemia 11/13/2006        Vitamin D deficiency 02/06/2013    ALLERGIES:  is allergic to celebrex [celecoxib], amlodipine besylate, enalapril, lipitor [atorvastatin], trelegy ellipta [fluticasone-umeclidin-vilant], amoxicillin, codeine sulfate, hydrocodone-acetaminophen, and tape.  MEDICATIONS:  Current Outpatient Medications  Medication Sig Dispense Refill   acetaminophen (TYLENOL) 325 MG tablet Take 325-650 mg by mouth daily as needed for moderate pain, fever or headache.      albuterol (PROAIR HFA) 108 (90 Base) MCG/ACT inhaler Inhale 1-2 puffs into the lungs every 6 (six) hours as needed for wheezing or shortness of breath. 18 g 11   allopurinol (ZYLOPRIM) 100 MG tablet Take 1 tablet (100 mg total) by mouth daily. 90 tablet 1   clonazePAM (KLONOPIN) 1 MG tablet  Take 1 tablet (1 mg total) by mouth 2 (two) times daily as needed for anxiety. (Patient taking differently: Take 0.5-1 mg by mouth 2 (two) times daily as needed for anxiety.) 180 tablet 0   cyanocobalamin 2000 MCG tablet Take 1 tablet (2,000 mcg total) by mouth daily. 90 tablet 3   ferrous sulfate 325 (65 FE) MG tablet Take 1 tablet (325 mg total) by mouth 2 (two) times daily with a meal. 180 tablet 1   fluticasone furoate-vilanterol (BREO ELLIPTA) 100-25 MCG/ACT AEPB Inhale  1 puff into the lungs daily. 120 each 1   isosorbide-hydrALAZINE (BIDIL) 20-37.5 MG tablet TAKE 1 TABLET BY MOUTH THREE TIMES DAILY (Patient taking differently: Take 1 tablet by mouth in the morning and at bedtime.) 270 tablet 0   metoprolol tartrate (LOPRESSOR) 25 MG tablet TAKE 1 TABLET(25 MG) BY MOUTH TWICE DAILY 180 tablet 1   morphine (MS CONTIN) 15 MG 12 hr tablet Take 1 tablet (15 mg total) by mouth 2 (two) times daily as needed for pain. 60 tablet 0   Nutritional Supplements (CARNATION BREAKFAST ESSENTIALS) LIQD Take 237 mLs by mouth daily.     nystatin (MYCOSTATIN/NYSTOP) powder Apply 1 application. topically 3 (three) times daily. 60 g 0   osimertinib mesylate (TAGRISSO) 80 MG tablet Take 1 tablet (80 mg total) by mouth daily. 30 tablet 3   OXYGEN Inhale 2 L/min into the lungs continuous.     prochlorperazine (COMPAZINE) 10 MG tablet Take 1 tablet (10 mg total) by mouth every 6 (six) hours as needed. (Patient taking differently: Take 10 mg by mouth every 6 (six) hours as needed for nausea or vomiting.) 30 tablet 2   protein supplement shake (PREMIER PROTEIN) LIQD Take 11 oz by mouth 2 (two) times daily between meals.     torsemide (DEMADEX) 20 MG tablet TAKE 2 TABLETS(40 MG) BY MOUTH DAILY 180 tablet 1   No current facility-administered medications for this visit.    SURGICAL HISTORY:  Past Surgical History:  Procedure Laterality Date   BRONCHOSCOPY     CHEST TUBE INSERTION Left 06/15/2021   Procedure: INSERTION PLEURAL DRAINAGE CATHETER;  Surgeon: [name], MD;  Location: [facility];  Service: Thoracic;  Laterality: Left;   COLONOSCOPY     COLONOSCOPY WITH PROPOFOL N/A 02/23/2015   Procedure: COLONOSCOPY WITH PROPOFOL;  Surgeon: Ladene Artist, MD;  Location: [facility];  Service: Endoscopy;  Laterality: N/A;   COLONOSCOPY WITH PROPOFOL N/A 08/12/2018   Procedure: COLONOSCOPY WITH PROPOFOL;  Surgeon: Ladene Artist, MD;  Location: [facility];  Service: Endoscopy;   Laterality: N/A;   COLONOSCOPY WITH PROPOFOL N/A 05/09/2020   Procedure: COLONOSCOPY WITH PROPOFOL;  Surgeon: Ladene Artist, MD;  Location: [facility];  Service: Endoscopy;  Laterality: N/A;  To Splenic Flexture   COLONOSCOPY WITH PROPOFOL N/A 11/23/2021   Procedure: COLONOSCOPY WITH PROPOFOL;  Surgeon: Mauri Pole, MD;  Location: [facility];  Service: Gastroenterology;  Laterality: N/A;   ESOPHAGOGASTRODUODENOSCOPY N/A 11/23/2021   Procedure: ESOPHAGOGASTRODUODENOSCOPY (EGD);  Surgeon: Mauri Pole, MD;  Location: [facility];  Service: Gastroenterology;  Laterality: N/A;   ESOPHAGOGASTRODUODENOSCOPY (EGD) WITH PROPOFOL N/A 02/23/2015   Procedure: ESOPHAGOGASTRODUODENOSCOPY (EGD) WITH PROPOFOL;  Surgeon: Ladene Artist, MD;  [facility];  Service: Endoscopy;  Laterality: N/A;   ESOPHAGOGASTRODUODENOSCOPY (EGD) WITH PROPOFOL N/A 08/12/2018   Procedure: ESOPHAGOGASTRODUODENOSCOPY (EGD) WITH PROPOFOL;  Surgeon: Ladene Artist, MD;  [facility];  Service: Endoscopy;  Laterality: N/A;   ESOPHAGOGASTRODUODENOSCOPY (EGD) WITH  PROPOFOL N/A 05/08/2020   Procedure: ESOPHAGOGASTRODUODENOSCOPY (EGD) WITH PROPOFOL;  Surgeon: Yetta Flock, MD;  [facility];  Service: Gastroenterology;  Laterality: N/A;   HEMOSTASIS CLIP PLACEMENT  11/23/2021   Procedure: HEMOSTASIS CLIP PLACEMENT;  Surgeon: Mauri Pole, MD;  [facility];  Service: Gastroenterology;;   HOT HEMOSTASIS N/A 08/12/2018   Procedure: HOT HEMOSTASIS (ARGON PLASMA COAGULATION/BICAP);  Surgeon: Ladene Artist, MD;  Location: [facility];  Service: Endoscopy;  Laterality: N/A;  EGD and Colon APC   HOT HEMOSTASIS N/A 05/08/2020   Procedure: HOT HEMOSTASIS (ARGON PLASMA COAGULATION/BICAP);  Surgeon: Yetta Flock, MD;  Location: [facility];  Service: Gastroenterology;  Laterality: N/A;   HOT HEMOSTASIS N/A 11/23/2021   Procedure: HOT HEMOSTASIS (ARGON PLASMA  COAGULATION/BICAP);  Surgeon: Mauri Pole, MD;  Location: [facility];  Service: Gastroenterology;  Laterality: N/A;   LAPAROTOMY N/A 08/11/2020   Procedure: EXPLORATORY LAPAROTOMY PARTIAL COLECTOMY AND COLOSTOMY WITH RESECTION OF A MESSENTERIC MASS;  Surgeon: Coralie Keens, MD;  Location: [facility];  Service: General;  Laterality: N/A;   POLYPECTOMY     REMOVAL OF PLEURAL DRAINAGE CATHETER N/A 10/17/2021   Procedure: MINOR REMOVAL OF PLEURAL DRAINAGE CATHETER;  Surgeon: [name], MD;  Location: [facility];  Service: Cardiopulmonary;  Laterality: N/A;    REVIEW OF SYSTEMS:  Constitutional: positive for fatigue Eyes: negative Ears, nose, mouth, throat, and face: negative Respiratory: positive for dyspnea on exertion Cardiovascular: negative Gastrointestinal: negative Genitourinary:negative Integument/breast: negative Hematologic/lymphatic: negative Musculoskeletal:positive for arthralgias and muscle weakness Neurological: negative Behavioral/Psych: negative Endocrine: negative Allergic/Immunologic: negative   PHYSICAL EXAMINATION: General appearance: alert, cooperative, fatigued, and no distress Head: Normocephalic, without obvious abnormality, atraumatic Neck: no adenopathy, no JVD, supple, symmetrical, trachea midline, and thyroid not enlarged, symmetric, no tenderness/mass/nodules Lymph nodes: Cervical, supraclavicular, and axillary nodes normal. Resp: clear to auscultation bilaterally Back: symmetric, no curvature. ROM normal. No CVA tenderness. Cardio: regular rate and rhythm, S1, S2 normal, no murmur, click, rub or gallop GI: soft, non-tender; bowel sounds normal; no masses,  no organomegaly Extremities: extremities normal, atraumatic, no cyanosis or edema Neurologic: Alert and oriented X 3, normal strength and tone. Normal symmetric reflexes. Normal coordination and gait  ECOG PERFORMANCE STATUS: 1 - Symptomatic but completely ambulatory  Blood  pressure (!) 119/57, pulse 89, temperature 97.6 F (36.4 C), temperature source Oral, resp. rate 18, SpO2 95 %.  LABORATORY DATA: Lab Results  Component Value Date   WBC 7.7 02/06/2022   HGB 9.6 (L) 02/06/2022   HCT 29.6 (L) 02/06/2022   MCV 83.5 02/06/2022   PLT 136.0 (L) 02/06/2022      Chemistry      Component Value Date/Time   NA 137 01/22/2022 0826   NA 142 04/11/2016 1007   K 4.0 01/22/2022 0826   K 3.3 (L) 04/11/2016 1007   CL 103 01/22/2022 0826   CO2 29 01/22/2022 0826   CO2 31 (H) 04/11/2016 1007   BUN 79 (H) 01/22/2022 0826   BUN 24.4 04/11/2016 1007   CREATININE 2.03 (H) 01/22/2022 0826   CREATININE 1.36 (H) 02/04/2020 1443   CREATININE 1.1 04/11/2016 1007      Component Value Date/Time   CALCIUM 9.6 01/22/2022 0826   CALCIUM 9.8 04/11/2016 1007   ALKPHOS 53 01/22/2022 0826   ALKPHOS 82 04/11/2016 1007   AST 12 (L) 01/22/2022 0826   AST 12 04/11/2016 1007   ALT 5 01/22/2022 0826   ALT 12 04/11/2016 1007   BILITOT 0.3 01/22/2022 0826  BILITOT 0.35 04/11/2016 1007       RADIOGRAPHIC STUDIES: No results found.  ASSESSMENT AND PLAN: This is a very pleasant 81 years old with a stage IV (TX, N3, M1 C) non-small cell lung cancer, adenocarcinoma with positive EGFR mutation exon 21 (L858R) diagnosed in October 2022 1 presented with left primary lung lesion in addition to widespread metastatic disease in the thoracic nodes, left pleural space as well as abdominal pelvic lymph node and bone metastasis. The patient is currently undergoing treatment with Tagrisso 80 mg p.o. daily started June 21, 2021.  She is status post 9 months of treatment.  The patient has been tolerating this treatment well with no concerning adverse effects. I recommended for her to continue her current treatment with Tagrisso with the same dose. For the iron deficiency anemia secondary to chronic gastrointestinal bleeding, she has stable hemoglobin of 9.5.  Her previous iron studies  showed significant iron deficiency. I recommended for the patient to continue with the oral iron tablet but I will also arrange for her to receive iron infusion with Venofer 300 mg IV weekly for 3 weeks at the [facility] infusion center. I will see the patient back for follow-up visit in 1 months for evaluation and repeat blood work including iron study. The patient was advised to call immediately if she has any other concerning symptoms in the interval.  The patient voices understanding of current disease status and treatment options and is in agreement with the current care plan.  All questions were answered. The patient knows to call the clinic with any problems, questions or concerns. We can certainly see the patient much sooner if necessary.   Disclaimer: This note was dictated with voice recognition software. Similar sounding words can inadvertently be transcribed and may not be corrected upon review.

## 2022-02-21 NOTE — Telephone Encounter (Signed)
Auth Submission: no auth needed Payer: [employer] MEDICARE Medication & CPT/J Code(s) submitted: Venofer (Iron Sucrose) [id number] Route of submission (phone, fax, portal): PORTAL Auth type: Buy/Bill Units/visits requested: X3 DOSES Reference number:  Approval from: 02/21/22 to 05/24/22

## 2022-02-21 NOTE — Progress Notes (Signed)
Provided one complimentary case of Ensure Plus High Protein

## 2022-02-22 ENCOUNTER — Other Ambulatory Visit: Payer: Self-pay

## 2022-02-22 ENCOUNTER — Other Ambulatory Visit: Payer: Self-pay | Admitting: Pharmacy Technician

## 2022-02-22 ENCOUNTER — Telehealth: Payer: Self-pay | Admitting: Internal Medicine

## 2022-02-22 NOTE — Telephone Encounter (Signed)
Scheduled per 07/26 los, patient has been called and voicemail was left.

## 2022-02-24 ENCOUNTER — Other Ambulatory Visit: Payer: Self-pay | Admitting: Internal Medicine

## 2022-02-24 DIAGNOSIS — J9611 Chronic respiratory failure with hypoxia: Secondary | ICD-10-CM

## 2022-02-24 DIAGNOSIS — J432 Centrilobular emphysema: Secondary | ICD-10-CM

## 2022-02-24 DIAGNOSIS — J411 Mucopurulent chronic bronchitis: Secondary | ICD-10-CM

## 2022-02-26 ENCOUNTER — Telehealth: Payer: Medicare Other

## 2022-02-27 ENCOUNTER — Ambulatory Visit (INDEPENDENT_AMBULATORY_CARE_PROVIDER_SITE_OTHER): Payer: Medicare Other

## 2022-02-27 VITALS — BP 125/78 | HR 81 | Temp 98.7°F | Resp 16

## 2022-02-27 DIAGNOSIS — K922 Gastrointestinal hemorrhage, unspecified: Secondary | ICD-10-CM

## 2022-02-27 DIAGNOSIS — D500 Iron deficiency anemia secondary to blood loss (chronic): Secondary | ICD-10-CM

## 2022-02-27 MED ORDER — SODIUM CHLORIDE 0.9 % IV SOLN
300.0000 mg | INTRAVENOUS | Status: DC
  Administered 2022-02-27: 300 mg via INTRAVENOUS
  Filled 2022-02-27: qty 15

## 2022-02-27 NOTE — Progress Notes (Signed)
Diagnosis: Iron Deficiency Anemia  Provider:  Marshell Garfinkel, MD  Procedure: Infusion  IV Type: Peripheral, IV Location: L Antecubital  Venofer (Iron Sucrose), Dose: 300 mg  Infusion Start Time: 1010  Infusion Stop Time: 1146  Post Infusion IV Care: Observation period completed and Peripheral IV Discontinued  Discharge: Condition: Good, Destination: Home . AVS provided to patient.   Performed by:  Koren Shiver, RN

## 2022-03-06 ENCOUNTER — Ambulatory Visit: Payer: Medicare Other | Admitting: *Deleted

## 2022-03-06 MED ORDER — SODIUM CHLORIDE 0.9 % IV SOLN
300.0000 mg | INTRAVENOUS | Status: DC
  Filled 2022-03-06: qty 15

## 2022-03-06 NOTE — Progress Notes (Signed)
Diagnosis: Iron Deficiency Anemia  Provider:  Marshell Garfinkel, MD  Procedure: Infusion  IV Type: Peripheral, IV Location: not started  Venofer (Iron Sucrose), Dose: 300 mg  Infusion Start Time: not started  Infusion Stop Time: not started Patient arrived at 1020am, no room availability, patent waited 30 minutes in Lobby and then patient and family chose to go home and reschedule at another time  Post Infusion IV Care:  not needed  Discharge: Condition: Good, Destination: Home . AVS provided to patient.   Performed by:  Oren Beckmann, RN

## 2022-03-13 ENCOUNTER — Ambulatory Visit (INDEPENDENT_AMBULATORY_CARE_PROVIDER_SITE_OTHER): Payer: Medicare Other

## 2022-03-13 VITALS — BP 103/68 | HR 101 | Temp 98.2°F | Resp 20 | Ht 60.0 in | Wt 157.6 lb

## 2022-03-13 DIAGNOSIS — D500 Iron deficiency anemia secondary to blood loss (chronic): Secondary | ICD-10-CM

## 2022-03-13 MED ORDER — SODIUM CHLORIDE 0.9 % IV SOLN
300.0000 mg | INTRAVENOUS | Status: DC
  Administered 2022-03-13: 300 mg via INTRAVENOUS
  Filled 2022-03-13: qty 15

## 2022-03-13 NOTE — Progress Notes (Signed)
Diagnosis: Iron Deficiency Anemia  Provider:  Marshell Garfinkel MD  Procedure: Infusion  IV Type: Peripheral, IV Location: L Antecubital  Venofer (Iron Sucrose), Dose: 300 mg  Infusion Start Time: 7681  Infusion Stop Time: 1204  Post Infusion IV Care: Peripheral IV Discontinued  Discharge: Condition: Good, Destination: Home . AVS provided to patient.   Performed by:  Arnoldo Morale, RN

## 2022-03-14 ENCOUNTER — Ambulatory Visit: Payer: Medicare Other | Admitting: Podiatry

## 2022-03-20 ENCOUNTER — Ambulatory Visit (INDEPENDENT_AMBULATORY_CARE_PROVIDER_SITE_OTHER): Payer: Medicare Other

## 2022-03-20 VITALS — BP 112/55 | HR 86 | Temp 98.2°F | Resp 16 | Ht 60.0 in | Wt 154.2 lb

## 2022-03-20 DIAGNOSIS — D500 Iron deficiency anemia secondary to blood loss (chronic): Secondary | ICD-10-CM

## 2022-03-20 DIAGNOSIS — C3490 Malignant neoplasm of unspecified part of unspecified bronchus or lung: Secondary | ICD-10-CM | POA: Diagnosis not present

## 2022-03-20 DIAGNOSIS — J910 Malignant pleural effusion: Secondary | ICD-10-CM | POA: Diagnosis not present

## 2022-03-20 DIAGNOSIS — Z933 Colostomy status: Secondary | ICD-10-CM | POA: Diagnosis not present

## 2022-03-20 MED ORDER — ALTEPLASE 2 MG IJ SOLR: 2.0000 mg | Freq: Once | INTRAMUSCULAR | Status: DC | PRN

## 2022-03-20 MED ORDER — METHYLPREDNISOLONE SODIUM SUCC 125 MG IJ SOLR: 125.0000 mg | Freq: Once | INTRAMUSCULAR | Status: DC | PRN

## 2022-03-20 MED ORDER — SODIUM CHLORIDE 0.9 % IV SOLN
300.0000 mg | Freq: Once | INTRAVENOUS | Status: AC
  Administered 2022-03-20: 300 mg via INTRAVENOUS
  Filled 2022-03-20: qty 15

## 2022-03-20 MED ORDER — SODIUM CHLORIDE 0.9 % IV SOLN: Freq: Once | INTRAVENOUS | Status: DC | PRN

## 2022-03-20 MED ORDER — DIPHENHYDRAMINE HCL 50 MG/ML IJ SOLN: 50.0000 mg | Freq: Once | INTRAMUSCULAR | Status: DC | PRN

## 2022-03-20 MED ORDER — ANTICOAGULANT SODIUM CITRATE 4% (200MG/5ML) IV SOLN: 5.0000 mL | Freq: Once | Status: DC | PRN

## 2022-03-20 MED ORDER — HEPARIN SOD (PORK) LOCK FLUSH 100 UNIT/ML IV SOLN: 500.0000 [IU] | Freq: Once | INTRAVENOUS | Status: DC | PRN

## 2022-03-20 MED ORDER — SODIUM CHLORIDE 0.9% FLUSH: 3.0000 mL | Freq: Once | INTRAVENOUS | Status: DC | PRN

## 2022-03-20 MED ORDER — EPINEPHRINE 0.3 MG/0.3ML IJ SOAJ: 0.3000 mg | Freq: Once | INTRAMUSCULAR | Status: DC | PRN

## 2022-03-20 MED ORDER — FAMOTIDINE IN NACL 20-0.9 MG/50ML-% IV SOLN: 20.0000 mg | Freq: Once | INTRAVENOUS | Status: DC | PRN

## 2022-03-20 MED ORDER — ALBUTEROL SULFATE HFA 108 (90 BASE) MCG/ACT IN AERS: 2.0000 | INHALATION_SPRAY | Freq: Once | RESPIRATORY_TRACT | Status: DC | PRN

## 2022-03-20 MED ORDER — SODIUM CHLORIDE 0.9% FLUSH: 10.0000 mL | Freq: Once | INTRAVENOUS | Status: DC | PRN

## 2022-03-20 MED ORDER — HEPARIN SOD (PORK) LOCK FLUSH 100 UNIT/ML IV SOLN: 250.0000 [IU] | Freq: Once | INTRAVENOUS | Status: DC | PRN

## 2022-03-20 NOTE — Progress Notes (Signed)
Diagnosis: Iron Deficiency Anemia  Provider:  Marshell Garfinkel MD  Procedure: Infusion  IV Type: Peripheral, IV Location: L Antecubital  Venofer (Iron Sucrose), Dose: 300 mg  Infusion Start Time: 4314  Infusion Stop Time: 1155 am  Post Infusion IV Care: Observation period completed and Peripheral IV Discontinued  Discharge: Condition: Good, Destination: Home . AVS provided to patient.   Performed by:  Adelina Mings, LPN

## 2022-03-26 ENCOUNTER — Inpatient Hospital Stay: Payer: Medicare Other | Attending: Internal Medicine

## 2022-03-26 ENCOUNTER — Inpatient Hospital Stay: Payer: Medicare Other | Admitting: Internal Medicine

## 2022-03-26 ENCOUNTER — Encounter: Payer: Self-pay | Admitting: Nutrition

## 2022-03-26 ENCOUNTER — Other Ambulatory Visit: Payer: Self-pay

## 2022-03-26 VITALS — BP 129/44 | HR 102 | Temp 97.8°F | Resp 15 | Wt 156.2 lb

## 2022-03-26 DIAGNOSIS — C782 Secondary malignant neoplasm of pleura: Secondary | ICD-10-CM | POA: Diagnosis not present

## 2022-03-26 DIAGNOSIS — I110 Hypertensive heart disease with heart failure: Secondary | ICD-10-CM | POA: Diagnosis not present

## 2022-03-26 DIAGNOSIS — D500 Iron deficiency anemia secondary to blood loss (chronic): Secondary | ICD-10-CM | POA: Diagnosis not present

## 2022-03-26 DIAGNOSIS — C7951 Secondary malignant neoplasm of bone: Secondary | ICD-10-CM | POA: Diagnosis not present

## 2022-03-26 DIAGNOSIS — C3490 Malignant neoplasm of unspecified part of unspecified bronchus or lung: Secondary | ICD-10-CM

## 2022-03-26 DIAGNOSIS — Z9981 Dependence on supplemental oxygen: Secondary | ICD-10-CM | POA: Insufficient documentation

## 2022-03-26 DIAGNOSIS — D649 Anemia, unspecified: Secondary | ICD-10-CM

## 2022-03-26 DIAGNOSIS — C778 Secondary and unspecified malignant neoplasm of lymph nodes of multiple regions: Secondary | ICD-10-CM | POA: Insufficient documentation

## 2022-03-26 DIAGNOSIS — I5042 Chronic combined systolic (congestive) and diastolic (congestive) heart failure: Secondary | ICD-10-CM | POA: Insufficient documentation

## 2022-03-26 DIAGNOSIS — C3492 Malignant neoplasm of unspecified part of left bronchus or lung: Secondary | ICD-10-CM | POA: Diagnosis not present

## 2022-03-26 LAB — CMP (CANCER CENTER ONLY)
ALT: 5 U/L (ref 0–44)
AST: 12 U/L — ABNORMAL LOW (ref 15–41)
Albumin: 3.8 g/dL (ref 3.5–5.0)
Alkaline Phosphatase: 54 U/L (ref 38–126)
Anion gap: 5 (ref 5–15)
BUN: 60 mg/dL — ABNORMAL HIGH (ref 8–23)
CO2: 30 mmol/L (ref 22–32)
Calcium: 10 mg/dL (ref 8.9–10.3)
Chloride: 103 mmol/L (ref 98–111)
Creatinine: 1.88 mg/dL — ABNORMAL HIGH (ref 0.44–1.00)
GFR, Estimated: 27 mL/min — ABNORMAL LOW (ref >60)
Glucose, Bld: 99 mg/dL (ref 70–99)
Potassium: 3.5 mmol/L (ref 3.5–5.1)
Sodium: 138 mmol/L (ref 135–145)
Total Bilirubin: 0.3 mg/dL (ref 0.3–1.2)
Total Protein: 7.3 g/dL (ref 6.5–8.1)

## 2022-03-26 LAB — CBC WITH DIFFERENTIAL (CANCER CENTER ONLY)
Abs Immature Granulocytes: 0.02 10*3/uL (ref 0.00–0.07)
Basophils Absolute: 0 10*3/uL (ref 0.0–0.1)
Basophils Relative: 0 %
Eosinophils Absolute: 0.1 10*3/uL (ref 0.0–0.5)
Eosinophils Relative: 2 %
HCT: 27.9 % — ABNORMAL LOW (ref 36.0–46.0)
Hemoglobin: 9 g/dL — ABNORMAL LOW (ref 12.0–15.0)
Immature Granulocytes: 0 %
Lymphocytes Relative: 14 %
Lymphs Abs: 0.8 10*3/uL (ref 0.7–4.0)
MCH: 26.6 pg (ref 26.0–34.0)
MCHC: 32.3 g/dL (ref 30.0–36.0)
MCV: 82.5 fL (ref 80.0–100.0)
Monocytes Absolute: 0.6 10*3/uL (ref 0.1–1.0)
Monocytes Relative: 10 %
Neutro Abs: 4.2 10*3/uL (ref 1.7–7.7)
Neutrophils Relative %: 74 %
Platelet Count: 121 10*3/uL — ABNORMAL LOW (ref 150–400)
RBC: 3.38 MIL/uL — ABNORMAL LOW (ref 3.87–5.11)
RDW: 15.5 % (ref 11.5–15.5)
WBC Count: 5.7 10*3/uL (ref 4.0–10.5)
nRBC: 0 % (ref 0.0–0.2)

## 2022-03-26 LAB — FERRITIN: Ferritin: 270 ng/mL (ref 11–307)

## 2022-03-26 LAB — IRON AND IRON BINDING CAPACITY (CC-WL,HP ONLY)
Iron: 32 ug/dL (ref 28–170)
Saturation Ratios: 12 % (ref 10.4–31.8)
TIBC: 262 ug/dL (ref 250–450)
UIBC: 230 ug/dL (ref 148–442)

## 2022-03-26 LAB — SAMPLE TO BLOOD BANK

## 2022-03-26 NOTE — Progress Notes (Signed)
[facility] [phone]   [phone]  OFFICE PROGRESS NOTE  Laurie Lima, MD [place] [address]  DIAGNOSIS: Stage IV (Tx, N3, M1C) non-small cell lung cancer, adenocarcinoma. She presented with a left primary lung lesion and widespread metastatic disease in the thoracic nodes, left pleural space, abdomen pelvic lymph nodes, and bones.  She was diagnosed in October 2022.  Molecular studies: The patient has positive EGFR mutation in exon 21 (L858R)   PRIOR THERAPY: Iron infusion with Venofer 300 Mg IV weekly for 3 weeks for the iron deficiency anemia   CURRENT THERAPY: Targeted treatment with Tagrisso 80 mg p.o. daily.  First dose started on June 21, 2021.  Status post 10 months.  INTERVAL HISTORY: [name] 81 y.o. female returns to the clinic today for follow-up visit accompanied by her Sister Altha Harm.  The patient is feeling fine today with no concerning complaints except for the baseline shortness of breath and she is currently on home oxygen.  She denied having any current chest pain, cough or hemoptysis.  She continues to have mild fatigue and also continues to have gastrointestinal blood loss from AV malformation.  The patient denied having any chest pain or hemoptysis.  She has no recent weight loss or night sweats.  She has no headache or visual changes.  She has no nausea, vomiting, diarrhea or constipation.  She continues to tolerate her treatment with Tagrisso fairly well.  She is here today for evaluation with repeat blood work.   MEDICAL HISTORY: Past Medical History:  Diagnosis Date   Allergy    Angiodysplasia of cecum    Angiodysplasia of duodenum    Arthritis    Asteatotic eczema 02/25/2016   Asthma    Atrial fibrillation ([place]) 10/17/2017   Blood transfusion without reported diagnosis    Cataract    CHF (congestive heart failure), NYHA class III, chronic, combined ([place]) 08/10/2017   Colonic  polyp 02/16/2008   Tubular adenoma   COPD (chronic obstructive pulmonary disease) ([place]) 01/27/1998   Cor pulmonale ([place]) 11/16/2014   GERD 07/30/1992   Gout    HTN (hypertension) 07/30/1988   Hyperlipidemia with target LDL less than 100 11/27/1996        Kidney stone 1960, 1972, 1991   lung ca 04/2021   Medical history non-contributory    MGUS (monoclonal gammopathy of unknown significance) 11/08/2016   Morbid obesity ([place]) 04/19/2010   She agrees to work on her lifestyle modifications to help her lose weight.    OSA (obstructive sleep apnea) 06/22/2013   Osteopenia, senile 02/05/2013   July 2014  -2.1 left femur -1.9 left forearm    Prediabetes 11/13/2006   Renal insufficiency    Stenosis of cervical spine with myelopathy ([place]) 01/01/2018   Supplemental oxygen dependent    2L via [place]   Ulcer of leg, chronic, right ([place]) 10/10/2011   ULCERATIVE COLITIS-LEFT SIDE 03/19/2008        Vitamin B12 deficiency anemia 11/13/2006        Vitamin D deficiency 02/06/2013    ALLERGIES:  is allergic to celebrex [celecoxib], amlodipine besylate, enalapril, lipitor [atorvastatin], trelegy ellipta [fluticasone-umeclidin-vilant], amoxicillin, codeine sulfate, hydrocodone-acetaminophen, and tape.  MEDICATIONS:  Current Outpatient Medications  Medication Sig Dispense Refill   acetaminophen (TYLENOL) 325 MG tablet Take 325-650 mg by mouth daily as needed for moderate pain, fever or headache.      albuterol (PROAIR HFA) 108 (90 Base) MCG/ACT inhaler  Inhale 1-2 puffs into the lungs every 6 (six) hours as needed for wheezing or shortness of breath. 18 g 11   allopurinol (ZYLOPRIM) 100 MG tablet Take 1 tablet (100 mg total) by mouth daily. 90 tablet 1   BREO ELLIPTA 100-25 MCG/ACT AEPB INHALE 1 PUFF INTO THE LUNGS DAILY 120 each 1   clonazePAM (KLONOPIN) 1 MG tablet Take 1 tablet (1 mg total) by mouth 2 (two) times daily as needed for anxiety. (Patient taking differently: Take 0.5-1 mg by mouth 2 (two)  times daily as needed for anxiety.) 180 tablet 0   cyanocobalamin 2000 MCG tablet Take 1 tablet (2,000 mcg total) by mouth daily. 90 tablet 3   ferrous sulfate 325 (65 FE) MG tablet Take 1 tablet (325 mg total) by mouth 2 (two) times daily with a meal. 180 tablet 1   isosorbide-hydrALAZINE (BIDIL) 20-37.5 MG tablet TAKE 1 TABLET BY MOUTH THREE TIMES DAILY (Patient taking differently: Take 1 tablet by mouth in the morning and at bedtime.) 270 tablet 0   metoprolol tartrate (LOPRESSOR) 25 MG tablet TAKE 1 TABLET(25 MG) BY MOUTH TWICE DAILY 180 tablet 1   morphine (MS CONTIN) 15 MG 12 hr tablet Take 1 tablet (15 mg total) by mouth 2 (two) times daily as needed for pain. 60 tablet 0   Nutritional Supplements (CARNATION BREAKFAST ESSENTIALS) LIQD Take 237 mLs by mouth daily.     nystatin (MYCOSTATIN/NYSTOP) powder Apply 1 application. topically 3 (three) times daily. 60 g 0   osimertinib mesylate (TAGRISSO) 80 MG tablet Take 1 tablet (80 mg total) by mouth daily. 30 tablet 3   OXYGEN Inhale 2 L/min into the lungs continuous.     prochlorperazine (COMPAZINE) 10 MG tablet Take 1 tablet (10 mg total) by mouth every 6 (six) hours as needed. (Patient taking differently: Take 10 mg by mouth every 6 (six) hours as needed for nausea or vomiting.) 30 tablet 2   protein supplement shake (PREMIER PROTEIN) LIQD Take 11 oz by mouth 2 (two) times daily between meals.     torsemide (DEMADEX) 20 MG tablet TAKE 2 TABLETS(40 MG) BY MOUTH DAILY 180 tablet 1   No current facility-administered medications for this visit.    SURGICAL HISTORY:  Past Surgical History:  Procedure Laterality Date   BRONCHOSCOPY     CHEST TUBE INSERTION Left 06/15/2021   Procedure: INSERTION PLEURAL DRAINAGE CATHETER;  Surgeon: [name], MD;  Location: [facility];  Service: Thoracic;  Laterality: Left;   COLONOSCOPY     COLONOSCOPY WITH PROPOFOL N/A 02/23/2015   Procedure: COLONOSCOPY WITH PROPOFOL;  Surgeon: Ladene Artist, MD;   Location: [facility];  Service: Endoscopy;  Laterality: N/A;   COLONOSCOPY WITH PROPOFOL N/A 08/12/2018   Procedure: COLONOSCOPY WITH PROPOFOL;  Surgeon: Ladene Artist, MD;  Location: [facility];  Service: Endoscopy;  Laterality: N/A;   COLONOSCOPY WITH PROPOFOL N/A 05/09/2020   Procedure: COLONOSCOPY WITH PROPOFOL;  Surgeon: Ladene Artist, MD;  Location: [facility];  Service: Endoscopy;  Laterality: N/A;  To Splenic Flexture   COLONOSCOPY WITH PROPOFOL N/A 11/23/2021   Procedure: COLONOSCOPY WITH PROPOFOL;  Surgeon: Mauri Pole, MD;  Location: [facility];  Service: Gastroenterology;  Laterality: N/A;   ESOPHAGOGASTRODUODENOSCOPY N/A 11/23/2021   Procedure: ESOPHAGOGASTRODUODENOSCOPY (EGD);  Surgeon: Mauri Pole, MD;  Location: [facility];  Service: Gastroenterology;  Laterality: N/A;   ESOPHAGOGASTRODUODENOSCOPY (EGD) WITH PROPOFOL N/A 02/23/2015   Procedure: ESOPHAGOGASTRODUODENOSCOPY (EGD) WITH PROPOFOL;  Surgeon: Ladene Artist, MD;  Location: [facility];  Service: Endoscopy;  Laterality: N/A;   ESOPHAGOGASTRODUODENOSCOPY (EGD) WITH PROPOFOL N/A 08/12/2018   Procedure: ESOPHAGOGASTRODUODENOSCOPY (EGD) WITH PROPOFOL;  Surgeon: Ladene Artist, MD;  [facility]: [facility];  Service: Endoscopy;  Laterality: N/A;   ESOPHAGOGASTRODUODENOSCOPY (EGD) WITH PROPOFOL N/A 05/08/2020   Procedure: ESOPHAGOGASTRODUODENOSCOPY (EGD) WITH PROPOFOL;  Surgeon: Yetta Flock, MD;  Location: [facility];  Service: Gastroenterology;  Laterality: N/A;   HEMOSTASIS CLIP PLACEMENT  11/23/2021   Procedure: HEMOSTASIS CLIP PLACEMENT;  Surgeon: Mauri Pole, MD;  Location: [facility];  Service: Gastroenterology;;   HOT HEMOSTASIS N/A 08/12/2018   Procedure: HOT HEMOSTASIS (ARGON PLASMA COAGULATION/BICAP);  Surgeon: Ladene Artist, MD;  Location: [facility];  Service: Endoscopy;  Laterality: N/A;  EGD and Colon APC   HOT HEMOSTASIS N/A 05/08/2020   Procedure: HOT  HEMOSTASIS (ARGON PLASMA COAGULATION/BICAP);  Surgeon: Yetta Flock, MD;  Location: [facility];  Service: Gastroenterology;  Laterality: N/A;   HOT HEMOSTASIS N/A 11/23/2021   Procedure: HOT HEMOSTASIS (ARGON PLASMA COAGULATION/BICAP);  Surgeon: Mauri Pole, MD;  Location: [facility];  Service: Gastroenterology;  Laterality: N/A;   LAPAROTOMY N/A 08/11/2020   Procedure: EXPLORATORY LAPAROTOMY PARTIAL COLECTOMY AND COLOSTOMY WITH RESECTION OF A MESSENTERIC MASS;  Surgeon: Coralie Keens, MD;  Location: [facility];  Service: General;  Laterality: N/A;   POLYPECTOMY     REMOVAL OF PLEURAL DRAINAGE CATHETER N/A 10/17/2021   Procedure: MINOR REMOVAL OF PLEURAL DRAINAGE CATHETER;  Surgeon: [name], MD;  Location: [facility];  Service: Cardiopulmonary;  Laterality: N/A;    REVIEW OF SYSTEMS:  A comprehensive review of systems was negative except for: Constitutional: positive for fatigue Respiratory: positive for cough and dyspnea on exertion   PHYSICAL EXAMINATION: General appearance: alert, cooperative, fatigued, and no distress Head: Normocephalic, without obvious abnormality, atraumatic Neck: no adenopathy, no JVD, supple, symmetrical, trachea midline, and thyroid not enlarged, symmetric, no tenderness/mass/nodules Lymph nodes: Cervical, supraclavicular, and axillary nodes normal. Resp: clear to auscultation bilaterally Back: symmetric, no curvature. ROM normal. No CVA tenderness. Cardio: regular rate and rhythm, S1, S2 normal, no murmur, click, rub or gallop GI: soft, non-tender; bowel sounds normal; no masses,  no organomegaly Extremities: extremities normal, atraumatic, no cyanosis or edema  ECOG PERFORMANCE STATUS: 1 - Symptomatic but completely ambulatory  Blood pressure (!) 129/44, pulse (!) 102, temperature 97.8 F (36.6 C), temperature source Oral, resp. rate 15, weight 156 lb 3.2 oz (70.9 kg), SpO2 98 %.  LABORATORY DATA: Lab Results  Component Value  Date   WBC 5.7 03/26/2022   HGB 9.0 (L) 03/26/2022   HCT 27.9 (L) 03/26/2022   MCV 82.5 03/26/2022   PLT 121 (L) 03/26/2022      Chemistry      Component Value Date/Time   NA 137 02/21/2022 1038   NA 142 04/11/2016 1007   K 4.0 02/21/2022 1038   K 3.3 (L) 04/11/2016 1007   CL 100 02/21/2022 1038   CO2 33 (H) 02/21/2022 1038   CO2 31 (H) 04/11/2016 1007   BUN 38 (H) 02/21/2022 1038   BUN 24.4 04/11/2016 1007   CREATININE 1.81 (H) 02/21/2022 1038   CREATININE 1.36 (H) 02/04/2020 1443   CREATININE 1.1 04/11/2016 1007      Component Value Date/Time   CALCIUM 9.5 02/21/2022 1038   CALCIUM 9.8 04/11/2016 1007   ALKPHOS 50 02/21/2022 1038   ALKPHOS 82 04/11/2016 1007   AST 10 (L) 02/21/2022 1038   AST 12 04/11/2016 1007  ALT <5 02/21/2022 1038   ALT 12 04/11/2016 1007   BILITOT 0.3 02/21/2022 1038   BILITOT 0.35 04/11/2016 1007       RADIOGRAPHIC STUDIES: No results found.  ASSESSMENT AND PLAN: This is a very pleasant 81 years old with a stage IV (TX, N3, M1 C) non-small cell lung cancer, adenocarcinoma with positive EGFR mutation exon 21 (L858R) diagnosed in October 2022 1 presented with left primary lung lesion in addition to widespread metastatic disease in the thoracic nodes, left pleural space as well as abdominal pelvic lymph node and bone metastasis. The patient is currently undergoing treatment with Tagrisso 80 mg p.o. daily started June 21, 2021.  She is status post 10 months of treatment.  The patient continues to tolerate her treatment with Tagrisso fairly well with no concerning adverse effects. I recommended for her to continue her current treatment with Tagrisso with the same dose. For the iron deficiency anemia.  This is secondary to persistent gastrointestinal blood loss from AV malformation.  The patient received iron infusion with Venofer 300 Mg IV weekly for 3 weeks last month.  We will continue to monitor her hemoglobin and hematocrit closely and  consider her for iron infusion or blood transfusion if needed. I will see her back for follow-up visit in 1 month for evaluation with repeat CT scan of the chest, abdomen and pelvis for restaging of her disease. The patient was advised to call immediately if she has any other concerning symptoms in the interval. The patient voices understanding of current disease status and treatment options and is in agreement with the current care plan.  All questions were answered. The patient knows to call the clinic with any problems, questions or concerns. We can certainly see the patient much sooner if necessary.   Disclaimer: This note was dictated with voice recognition software. Similar sounding words can inadvertently be transcribed and may not be corrected upon review.

## 2022-03-26 NOTE — Progress Notes (Signed)
Provided one complimentary case of ensure plus high protein.

## 2022-03-27 ENCOUNTER — Other Ambulatory Visit: Payer: Self-pay

## 2022-03-30 ENCOUNTER — Telehealth: Payer: Self-pay | Admitting: Medical Oncology

## 2022-03-30 NOTE — Telephone Encounter (Signed)
Asking to come in for a urine test . She states she has L sided abdominal pain .   I called back and LVM to contact her PCP or go to Urgent Care.

## 2022-03-31 ENCOUNTER — Other Ambulatory Visit: Payer: Self-pay | Admitting: Internal Medicine

## 2022-03-31 DIAGNOSIS — I5042 Chronic combined systolic (congestive) and diastolic (congestive) heart failure: Secondary | ICD-10-CM

## 2022-04-03 ENCOUNTER — Other Ambulatory Visit: Payer: Self-pay | Admitting: Internal Medicine

## 2022-04-03 ENCOUNTER — Telehealth: Payer: Self-pay | Admitting: Internal Medicine

## 2022-04-03 DIAGNOSIS — I5042 Chronic combined systolic (congestive) and diastolic (congestive) heart failure: Secondary | ICD-10-CM

## 2022-04-03 DIAGNOSIS — R300 Dysuria: Secondary | ICD-10-CM | POA: Diagnosis not present

## 2022-04-03 NOTE — Telephone Encounter (Signed)
Called patient regarding upcoming September appointment, patient is notified.

## 2022-04-04 ENCOUNTER — Other Ambulatory Visit: Payer: Self-pay | Admitting: Medical Oncology

## 2022-04-04 DIAGNOSIS — C3492 Malignant neoplasm of unspecified part of left bronchus or lung: Secondary | ICD-10-CM

## 2022-04-04 MED ORDER — OSIMERTINIB MESYLATE 80 MG PO TABS: 80.0000 mg | ORAL_TABLET | Freq: Every day | ORAL | 3 refills | Status: DC

## 2022-04-20 DIAGNOSIS — J910 Malignant pleural effusion: Secondary | ICD-10-CM | POA: Diagnosis not present

## 2022-04-20 DIAGNOSIS — Z933 Colostomy status: Secondary | ICD-10-CM | POA: Diagnosis not present

## 2022-04-20 DIAGNOSIS — C3490 Malignant neoplasm of unspecified part of unspecified bronchus or lung: Secondary | ICD-10-CM | POA: Diagnosis not present

## 2022-04-24 ENCOUNTER — Encounter: Payer: Self-pay | Admitting: Nutrition

## 2022-04-24 ENCOUNTER — Other Ambulatory Visit: Payer: Self-pay

## 2022-04-24 ENCOUNTER — Encounter (HOSPITAL_COMMUNITY): Payer: Self-pay

## 2022-04-24 ENCOUNTER — Ambulatory Visit (HOSPITAL_COMMUNITY)
Admission: RE | Admit: 2022-04-24 | Discharge: 2022-04-24 | Disposition: A | Discharge status: Home / Self Care | Payer: Medicare Other | Source: Ambulatory Visit | Attending: Internal Medicine | Admitting: Internal Medicine

## 2022-04-24 ENCOUNTER — Inpatient Hospital Stay: Payer: Medicare Other | Attending: Internal Medicine

## 2022-04-24 DIAGNOSIS — K7689 Other specified diseases of liver: Secondary | ICD-10-CM | POA: Diagnosis not present

## 2022-04-24 DIAGNOSIS — R911 Solitary pulmonary nodule: Secondary | ICD-10-CM | POA: Diagnosis not present

## 2022-04-24 DIAGNOSIS — C3490 Malignant neoplasm of unspecified part of unspecified bronchus or lung: Secondary | ICD-10-CM | POA: Insufficient documentation

## 2022-04-24 DIAGNOSIS — D509 Iron deficiency anemia, unspecified: Secondary | ICD-10-CM | POA: Insufficient documentation

## 2022-04-24 DIAGNOSIS — I110 Hypertensive heart disease with heart failure: Secondary | ICD-10-CM | POA: Insufficient documentation

## 2022-04-24 DIAGNOSIS — C3492 Malignant neoplasm of unspecified part of left bronchus or lung: Secondary | ICD-10-CM | POA: Diagnosis not present

## 2022-04-24 DIAGNOSIS — C778 Secondary and unspecified malignant neoplasm of lymph nodes of multiple regions: Secondary | ICD-10-CM | POA: Insufficient documentation

## 2022-04-24 DIAGNOSIS — C7951 Secondary malignant neoplasm of bone: Secondary | ICD-10-CM | POA: Insufficient documentation

## 2022-04-24 DIAGNOSIS — I509 Heart failure, unspecified: Secondary | ICD-10-CM | POA: Insufficient documentation

## 2022-04-24 DIAGNOSIS — C782 Secondary malignant neoplasm of pleura: Secondary | ICD-10-CM | POA: Insufficient documentation

## 2022-04-24 DIAGNOSIS — E041 Nontoxic single thyroid nodule: Secondary | ICD-10-CM | POA: Insufficient documentation

## 2022-04-24 DIAGNOSIS — I700 Atherosclerosis of aorta: Secondary | ICD-10-CM | POA: Insufficient documentation

## 2022-04-24 DIAGNOSIS — I2510 Atherosclerotic heart disease of native coronary artery without angina pectoris: Secondary | ICD-10-CM | POA: Insufficient documentation

## 2022-04-24 LAB — CBC WITH DIFFERENTIAL (CANCER CENTER ONLY)
Abs Immature Granulocytes: 0.02 10*3/uL (ref 0.00–0.07)
Basophils Absolute: 0 10*3/uL (ref 0.0–0.1)
Basophils Relative: 1 %
Eosinophils Absolute: 0.2 10*3/uL (ref 0.0–0.5)
Eosinophils Relative: 3 %
HCT: 28.7 % — ABNORMAL LOW (ref 36.0–46.0)
Hemoglobin: 9.1 g/dL — ABNORMAL LOW (ref 12.0–15.0)
Immature Granulocytes: 0 %
Lymphocytes Relative: 13 %
Lymphs Abs: 0.9 10*3/uL (ref 0.7–4.0)
MCH: 26.5 pg (ref 26.0–34.0)
MCHC: 31.7 g/dL (ref 30.0–36.0)
MCV: 83.7 fL (ref 80.0–100.0)
Monocytes Absolute: 0.6 10*3/uL (ref 0.1–1.0)
Monocytes Relative: 8 %
Neutro Abs: 5.6 10*3/uL (ref 1.7–7.7)
Neutrophils Relative %: 75 %
Platelet Count: 130 10*3/uL — ABNORMAL LOW (ref 150–400)
RBC: 3.43 MIL/uL — ABNORMAL LOW (ref 3.87–5.11)
RDW: 16.1 % — ABNORMAL HIGH (ref 11.5–15.5)
WBC Count: 7.4 10*3/uL (ref 4.0–10.5)
nRBC: 0 % (ref 0.0–0.2)

## 2022-04-24 LAB — CMP (CANCER CENTER ONLY)
ALT: 6 U/L (ref 0–44)
AST: 14 U/L — ABNORMAL LOW (ref 15–41)
Albumin: 3.8 g/dL (ref 3.5–5.0)
Alkaline Phosphatase: 54 U/L (ref 38–126)
Anion gap: 4 — ABNORMAL LOW (ref 5–15)
BUN: 45 mg/dL — ABNORMAL HIGH (ref 8–23)
CO2: 32 mmol/L (ref 22–32)
Calcium: 9.6 mg/dL (ref 8.9–10.3)
Chloride: 101 mmol/L (ref 98–111)
Creatinine: 1.75 mg/dL — ABNORMAL HIGH (ref 0.44–1.00)
GFR, Estimated: 29 mL/min — ABNORMAL LOW (ref >60)
Glucose, Bld: 110 mg/dL — ABNORMAL HIGH (ref 70–99)
Potassium: 4.4 mmol/L (ref 3.5–5.1)
Sodium: 137 mmol/L (ref 135–145)
Total Bilirubin: 0.4 mg/dL (ref 0.3–1.2)
Total Protein: 7.5 g/dL (ref 6.5–8.1)

## 2022-04-24 NOTE — Progress Notes (Signed)
Provided one complimentary case of ensure plus high protein.

## 2022-04-26 ENCOUNTER — Other Ambulatory Visit: Payer: Self-pay

## 2022-04-26 ENCOUNTER — Inpatient Hospital Stay: Payer: Medicare Other | Admitting: Internal Medicine

## 2022-04-26 VITALS — BP 134/68 | HR 74 | Temp 98.4°F | Resp 16 | Wt 153.0 lb

## 2022-04-26 DIAGNOSIS — C778 Secondary and unspecified malignant neoplasm of lymph nodes of multiple regions: Secondary | ICD-10-CM | POA: Diagnosis not present

## 2022-04-26 DIAGNOSIS — D509 Iron deficiency anemia, unspecified: Secondary | ICD-10-CM | POA: Diagnosis not present

## 2022-04-26 DIAGNOSIS — I2510 Atherosclerotic heart disease of native coronary artery without angina pectoris: Secondary | ICD-10-CM | POA: Diagnosis not present

## 2022-04-26 DIAGNOSIS — C3492 Malignant neoplasm of unspecified part of left bronchus or lung: Secondary | ICD-10-CM

## 2022-04-26 DIAGNOSIS — C7951 Secondary malignant neoplasm of bone: Secondary | ICD-10-CM | POA: Diagnosis not present

## 2022-04-26 DIAGNOSIS — I700 Atherosclerosis of aorta: Secondary | ICD-10-CM | POA: Diagnosis not present

## 2022-04-26 DIAGNOSIS — C782 Secondary malignant neoplasm of pleura: Secondary | ICD-10-CM | POA: Diagnosis not present

## 2022-04-26 DIAGNOSIS — I509 Heart failure, unspecified: Secondary | ICD-10-CM | POA: Diagnosis not present

## 2022-04-26 DIAGNOSIS — E041 Nontoxic single thyroid nodule: Secondary | ICD-10-CM | POA: Diagnosis not present

## 2022-04-26 DIAGNOSIS — I110 Hypertensive heart disease with heart failure: Secondary | ICD-10-CM | POA: Diagnosis not present

## 2022-04-26 NOTE — Progress Notes (Signed)
[place] [phone]   [phone]  OFFICE PROGRESS NOTE  [name], MD [place] [address]  DIAGNOSIS:  1) Stage IV (Tx, N3, M1C) non-small cell lung cancer, adenocarcinoma. She presented with a left primary lung lesion and widespread metastatic disease in the thoracic nodes, left pleural space, abdomen pelvic lymph nodes, and bones.  She was diagnosed in October 2022. 2) suspicious right renal cell carcinoma followed by Dr. Tammi Klippel from [facility] urology  Molecular studies: The patient has positive EGFR mutation in exon 21 (L858R)   PRIOR THERAPY: Iron infusion with Venofer 300 Mg IV weekly for 3 weeks for the iron deficiency anemia   CURRENT THERAPY: Targeted treatment with Tagrisso 80 mg p.o. daily.  First dose started on June 21, 2021.  Status post 11 months.  INTERVAL HISTORY: CALEN POSCH 81 y.o. female returns to the clinic today for follow-up visit accompanied by her daughter.  The patient continues to complain of the baseline fatigue as well as shortness of breath with exertion.  She has no current chest pain, cough or hemoptysis but she has some wheezing on the left side.  She denied having any fever or chills.  She has no nausea, vomiting, diarrhea or constipation.  She has no headache or visual changes.  She denied having any recent weight loss or night sweats.  She has been tolerating her treatment with Tagrisso fairly well.  The patient is here today for evaluation with repeat CT scan of the chest, abdomen and pelvis for restaging of her disease.  MEDICAL HISTORY: Past Medical History:  Diagnosis Date   Allergy    Angiodysplasia of cecum    Angiodysplasia of duodenum    Arthritis    Asteatotic eczema 02/25/2016   Asthma    Atrial fibrillation ([place]) 10/17/2017   Blood transfusion without reported diagnosis    Cataract    CHF (congestive heart failure), NYHA class III, chronic, combined ([place])  08/10/2017   Colonic polyp 02/16/2008   Tubular adenoma   COPD (chronic obstructive pulmonary disease) ([place]) 01/27/1998   Cor pulmonale ([place]) 11/16/2014   GERD 07/30/1992   Gout    HTN (hypertension) 07/30/1988   Hyperlipidemia with target LDL less than 100 11/27/1996        Kidney stone 1960, 1972, 1991   lung ca 04/2021   Medical history non-contributory    MGUS (monoclonal gammopathy of unknown significance) 11/08/2016   Morbid obesity ([place]) 04/19/2010   She agrees to work on her lifestyle modifications to help her lose weight.    OSA (obstructive sleep apnea) 06/22/2013   Osteopenia, senile 02/05/2013   July 2014  -2.1 left femur -1.9 left forearm    Prediabetes 11/13/2006   Renal insufficiency    Stenosis of cervical spine with myelopathy ([place]) 01/01/2018   Supplemental oxygen dependent    2L via [place]   Ulcer of leg, chronic, right ([place]) 10/10/2011   ULCERATIVE COLITIS-LEFT SIDE 03/19/2008        Vitamin B12 deficiency anemia 11/13/2006        Vitamin D deficiency 02/06/2013    ALLERGIES:  is allergic to celebrex [celecoxib], amlodipine besylate, enalapril, lipitor [atorvastatin], trelegy ellipta [fluticasone-umeclidin-vilant], amoxicillin, codeine sulfate, hydrocodone-acetaminophen, and tape.  MEDICATIONS:  Current Outpatient Medications  Medication Sig Dispense Refill   acetaminophen (TYLENOL) 325 MG tablet Take 325-650 mg by mouth daily as needed for moderate pain, fever or headache.      albuterol (  PROAIR HFA) 108 (90 Base) MCG/ACT inhaler Inhale 1-2 puffs into the lungs every 6 (six) hours as needed for wheezing or shortness of breath. 18 g 11   allopurinol (ZYLOPRIM) 100 MG tablet Take 1 tablet (100 mg total) by mouth daily. 90 tablet 1   BREO ELLIPTA 100-25 MCG/ACT AEPB INHALE 1 PUFF INTO THE LUNGS DAILY 120 each 1   clonazePAM (KLONOPIN) 1 MG tablet Take 1 tablet (1 mg total) by mouth 2 (two) times daily as needed for anxiety. (Patient taking differently: Take  0.5-1 mg by mouth 2 (two) times daily as needed for anxiety.) 180 tablet 0   cyanocobalamin 2000 MCG tablet Take 1 tablet (2,000 mcg total) by mouth daily. 90 tablet 3   ferrous sulfate 325 (65 FE) MG tablet Take 1 tablet (325 mg total) by mouth 2 (two) times daily with a meal. 180 tablet 1   isosorbide-hydrALAZINE (BIDIL) 20-37.5 MG tablet TAKE 1 TABLET BY MOUTH THREE TIMES DAILY 270 tablet 0   metoprolol tartrate (LOPRESSOR) 25 MG tablet TAKE 1 TABLET(25 MG) BY MOUTH TWICE DAILY 180 tablet 1   morphine (MS CONTIN) 15 MG 12 hr tablet Take 1 tablet (15 mg total) by mouth 2 (two) times daily as needed for pain. 60 tablet 0   Nutritional Supplements (CARNATION BREAKFAST ESSENTIALS) LIQD Take 237 mLs by mouth daily.     nystatin (MYCOSTATIN/NYSTOP) powder Apply 1 application. topically 3 (three) times daily. 60 g 0   osimertinib mesylate (TAGRISSO) 80 MG tablet Take 1 tablet (80 mg total) by mouth daily. 30 tablet 3   OXYGEN Inhale 2 L/min into the lungs continuous.     prochlorperazine (COMPAZINE) 10 MG tablet Take 1 tablet (10 mg total) by mouth every 6 (six) hours as needed. (Patient taking differently: Take 10 mg by mouth every 6 (six) hours as needed for nausea or vomiting.) 30 tablet 2   protein supplement shake (PREMIER PROTEIN) LIQD Take 11 oz by mouth 2 (two) times daily between meals.     torsemide (DEMADEX) 20 MG tablet TAKE 2 TABLETS(40 MG) BY MOUTH DAILY 180 tablet 1   No current facility-administered medications for this visit.    SURGICAL HISTORY:  Past Surgical History:  Procedure Laterality Date   BRONCHOSCOPY     CHEST TUBE INSERTION Left 06/15/2021   Procedure: INSERTION PLEURAL DRAINAGE CATHETER;  Surgeon: [name], MD;  Location: [facility];  Service: Thoracic;  Laterality: Left;   COLONOSCOPY     COLONOSCOPY WITH PROPOFOL N/A 02/23/2015   Procedure: COLONOSCOPY WITH PROPOFOL;  Surgeon: Ladene Artist, MD;  Location: [facility];  Service: Endoscopy;  Laterality:  N/A;   COLONOSCOPY WITH PROPOFOL N/A 08/12/2018   Procedure: COLONOSCOPY WITH PROPOFOL;  Surgeon: Ladene Artist, MD;  Location: [facility];  Service: Endoscopy;  Laterality: N/A;   COLONOSCOPY WITH PROPOFOL N/A 05/09/2020   Procedure: COLONOSCOPY WITH PROPOFOL;  Surgeon: Ladene Artist, MD;  Location: [facility];  Service: Endoscopy;  Laterality: N/A;  To Splenic Flexture   COLONOSCOPY WITH PROPOFOL N/A 11/23/2021   Procedure: COLONOSCOPY WITH PROPOFOL;  Surgeon: Mauri Pole, MD;  Location: [facility];  Service: Gastroenterology;  Laterality: N/A;   ESOPHAGOGASTRODUODENOSCOPY N/A 11/23/2021   Procedure: ESOPHAGOGASTRODUODENOSCOPY (EGD);  Surgeon: Mauri Pole, MD;  Location: [facility];  Service: Gastroenterology;  Laterality: N/A;   ESOPHAGOGASTRODUODENOSCOPY (EGD) WITH PROPOFOL N/A 02/23/2015   Procedure: ESOPHAGOGASTRODUODENOSCOPY (EGD) WITH PROPOFOL;  Surgeon: Ladene Artist, MD;  [facility];  Service: Endoscopy;  Laterality: N/A;   ESOPHAGOGASTRODUODENOSCOPY (EGD) WITH PROPOFOL N/A 08/12/2018   Procedure: ESOPHAGOGASTRODUODENOSCOPY (EGD) WITH PROPOFOL;  Surgeon: Ladene Artist, MD;  [facility];  Service: Endoscopy;  Laterality: N/A;   ESOPHAGOGASTRODUODENOSCOPY (EGD) WITH PROPOFOL N/A 05/08/2020   Procedure: ESOPHAGOGASTRODUODENOSCOPY (EGD) WITH PROPOFOL;  Surgeon: Yetta Flock, MD;  [facility]: [facility];  Service: Gastroenterology;  Laterality: N/A;   HEMOSTASIS CLIP PLACEMENT  11/23/2021   Procedure: HEMOSTASIS CLIP PLACEMENT;  Surgeon: Mauri Pole, MD;  Location: [facility];  Service: Gastroenterology;;   HOT HEMOSTASIS N/A 08/12/2018   Procedure: HOT HEMOSTASIS (ARGON PLASMA COAGULATION/BICAP);  Surgeon: Ladene Artist, MD;  Location: [facility];  Service: Endoscopy;  Laterality: N/A;  EGD and Colon APC   HOT HEMOSTASIS N/A 05/08/2020   Procedure: HOT HEMOSTASIS (ARGON PLASMA COAGULATION/BICAP);  Surgeon: Yetta Flock, MD;  Location: [facility];  Service: Gastroenterology;  Laterality: N/A;   HOT HEMOSTASIS N/A 11/23/2021   Procedure: HOT HEMOSTASIS (ARGON PLASMA COAGULATION/BICAP);  Surgeon: Mauri Pole, MD;  Location: [facility];  Service: Gastroenterology;  Laterality: N/A;   LAPAROTOMY N/A 08/11/2020   Procedure: EXPLORATORY LAPAROTOMY PARTIAL COLECTOMY AND COLOSTOMY WITH RESECTION OF A MESSENTERIC MASS;  Surgeon: Coralie Keens, MD;  Location: [facility];  Service: General;  Laterality: N/A;   POLYPECTOMY     REMOVAL OF PLEURAL DRAINAGE CATHETER N/A 10/17/2021   Procedure: MINOR REMOVAL OF PLEURAL DRAINAGE CATHETER;  Surgeon: [name], MD;  Location: [facility];  Service: Cardiopulmonary;  Laterality: N/A;    REVIEW OF SYSTEMS:  Constitutional: positive for fatigue Eyes: negative Ears, nose, mouth, throat, and face: negative Respiratory: positive for dyspnea on exertion and wheezing Cardiovascular: negative Gastrointestinal: negative Genitourinary:negative Integument/breast: negative Hematologic/lymphatic: negative Musculoskeletal:negative Neurological: negative Behavioral/Psych: negative Endocrine: negative Allergic/Immunologic: negative   PHYSICAL EXAMINATION: General appearance: alert, cooperative, fatigued, and no distress Head: Normocephalic, without obvious abnormality, atraumatic Neck: no adenopathy, no JVD, supple, symmetrical, trachea midline, and thyroid not enlarged, symmetric, no tenderness/mass/nodules Lymph nodes: Cervical, supraclavicular, and axillary nodes normal. Resp: clear to auscultation bilaterally Back: symmetric, no curvature. ROM normal. No CVA tenderness. Cardio: regular rate and rhythm, S1, S2 normal, no murmur, click, rub or gallop GI: soft, non-tender; bowel sounds normal; no masses,  no organomegaly Extremities: extremities normal, atraumatic, no cyanosis or edema Neurologic: Alert and oriented X 3, normal strength and tone.  Normal symmetric reflexes. Normal coordination and gait  ECOG PERFORMANCE STATUS: 1 - Symptomatic but completely ambulatory  Blood pressure 134/68, pulse 74, temperature 98.4 F (36.9 C), temperature source Oral, resp. rate 16, weight 153 lb (69.4 kg), SpO2 100 %.  LABORATORY DATA: Lab Results  Component Value Date   WBC 7.4 04/24/2022   HGB 9.1 (L) 04/24/2022   HCT 28.7 (L) 04/24/2022   MCV 83.7 04/24/2022   PLT 130 (L) 04/24/2022      Chemistry      Component Value Date/Time   NA 137 04/24/2022 1217   NA 142 04/11/2016 1007   K 4.4 04/24/2022 1217   K 3.3 (L) 04/11/2016 1007   CL 101 04/24/2022 1217   CO2 32 04/24/2022 1217   CO2 31 (H) 04/11/2016 1007   BUN 45 (H) 04/24/2022 1217   BUN 24.4 04/11/2016 1007   CREATININE 1.75 (H) 04/24/2022 1217   CREATININE 1.36 (H) 02/04/2020 1443   CREATININE 1.1 04/11/2016 1007      Component Value Date/Time   CALCIUM 9.6 04/24/2022 1217   CALCIUM 9.8 04/11/2016 1007   ALKPHOS 54  04/24/2022 1217   ALKPHOS 82 04/11/2016 1007   AST 14 (L) 04/24/2022 1217   AST 12 04/11/2016 1007   ALT 6 04/24/2022 1217   ALT 12 04/11/2016 1007   BILITOT 0.4 04/24/2022 1217   BILITOT 0.35 04/11/2016 1007       RADIOGRAPHIC STUDIES: CT Chest Wo Contrast  Result Date: 04/25/2022 CLINICAL DATA:  Non-small cell lung cancer staging in a female at age 80. * Tracking Code: BO * EXAM: CT CHEST, ABDOMEN AND PELVIS WITHOUT CONTRAST TECHNIQUE: Multidetector CT imaging of the chest, abdomen and pelvis was performed following the standard protocol without IV contrast. RADIATION DOSE REDUCTION: This exam was performed according to the departmental dose-optimization program which includes automated exposure control, adjustment of the mA and/or kV according to patient size and/or use of iterative reconstruction technique. COMPARISON:  January 17, 2022 FINDINGS: CT CHEST FINDINGS Cardiovascular: Calcified aortic atherosclerotic changes. No aneurysmal dilation of the  thoracic aorta. Moderate cardiomegaly without pericardial effusion or nodularity. Three-vessel coronary artery disease. Central pulmonary vessels engorged to 3.5 cm diameter of main pulmonary artery. Mediastinum/Nodes: RIGHT paratracheal lymph node (image 26/2) 9 mm previously 8 mm. Subcarinal lymph node (image 30/2) 16 mm as compared to 13 mm. Small AP window lymph nodes approximately 7 mm are not substantially changed compared to recent imaging from June of 2023. No internal mammary lymphadenopathy. No thoracic inlet or axillary lymphadenopathy. RIGHT thyroid nodule may measure as much as 1.9 cm (image 9/2) esophagus grossly normal. Lungs/Pleura: Loculated pleural fluid in the LEFT chest with similar volume in the lower chest, slightly increased volume in the upper chest and in the major fissure. No gross nodularity along pleural surfaces. No RIGHT-sided pleural effusion. Mild biapical scarring. Volume loss in the LEFT lower lobe with similar appearance to prior imaging. Airways are patent. Tiny LEFT upper lobe nodules are unchanged. Ground-glass nodule in the RIGHT upper lobe (image 76/4) 7-8 mm, likewise unchanged. Small Bochdalek's hernia on the RIGHT containing fat. Musculoskeletal: No chest wall mass. See below for full musculoskeletal details. CT ABDOMEN PELVIS FINDINGS Hepatobiliary: Smooth hepatic contours. No pericholecystic stranding. No gross biliary duct distension. No visible lesion on noncontrast imaging. Pancreas: Pancreas with normal contours, no signs of inflammation or peripancreatic fluid. Spleen: Small low-density focus in the spleen is unchanged. Splenic contours are stable without splenic enlargement. Adrenals/Urinary Tract: Mild thickening of the LEFT adrenal is a stable finding. RIGHT adrenal is normal. 4 x 3.6 cm mass arising from posterior RIGHT kidney shown to have enhancement on previous imaging compatible with renal neoplasm. Cysts of the bilateral kidneys elsewhere and hemorrhagic  cysts without substantial change. No hydronephrosis. No nephrolithiasis or ureteral calculi. Stomach/Bowel: Abundant formed stool in the rectum is unchanged and without perirectal stranding or signs of colonic obstruction. Colostomy in the mid abdomen contains the long blind ending colon and ostomy is present at the level of the mid transverse colon. Peristomal herniation of the distal colon and fat. Adjacent wide-mouth hernia to the RIGHT of the midline contains the cecum and small bowel. These findings are unchanged and without signs of obstruction. No stranding adjacent to the stomach. Vascular/Lymphatic: Aortic atherosclerosis. No sign of aneurysm. Smooth contour of the IVC. There is no gastrohepatic or hepatoduodenal ligament lymphadenopathy. No retroperitoneal or mesenteric lymphadenopathy. No pelvic sidewall lymphadenopathy. Vascular structures with limited assessment due to lack of intravenous contrast. Reproductive: Unremarkable by CT. Other: Ventral abdominal wall hernias associated with the ostomy and adjacent to the ostomy to the RIGHT of the midline  as discussed. No ascites. Mild stranding about the gluteal fold along its upper margin question of mild nodular skin thickening in this area. Musculoskeletal: Spinal degenerative changes. Inferior endplate concavity has developed at the T12 level since previous imaging. This is accounting for hypoplastic ribs at this level in terms of localization. Similar appearance of endplate concavity at L3 and L4. IMPRESSION: 1. Loculated pleural fluid in the LEFT chest with similar volume in the lower chest, slightly increased volume in the upper chest and in the major fissure. No gross nodularity along pleural surfaces. 2. Volume loss in the LEFT lower lobe with similar appearance to prior imaging. 3. Stable small pulmonary nodules in the chest and ground-glass nodule in the RIGHT upper chest. 4. Mild inferior endplate concavity at T12 may reflect recent fracture.  Correlate with any symptoms in this location. 5. Mild stranding about the gluteal fold along its upper margin question of mild nodular skin thickening in this area. Correlate with any signs of skin breakdown or inflammation in this location. 6. 4 x 3.6 cm mass arising from posterior RIGHT kidney compatible with renal neoplasm, suspicious for RCC. Size is similar to previous imaging. 7. Ventral abdominal wall hernias associated with the ostomy and adjacent to the ostomy to the RIGHT of the midline. These findings are unchanged and without signs of obstruction. 8. Abundant formed stool in the rectum is unchanged and without perirectal stranding or signs of colonic obstruction. 9. Three-vessel coronary artery disease. 10. Aortic atherosclerosis. 11. RIGHT thyroid nodule may measure as much as 1.9 cm. Recommend nonemergent thyroid US if not yet performed(ref: J Am Coll Radiol. 2015 Feb;12(2): 143-50). Electronically Signed   By: Zetta Bills M.D.   On: 04/25/2022 16:45   CT Abdomen Pelvis Wo Contrast  Result Date: 04/25/2022 CLINICAL DATA:  Non-small cell lung cancer staging in a female at age 81. * Tracking Code: BO * EXAM: CT CHEST, ABDOMEN AND PELVIS WITHOUT CONTRAST TECHNIQUE: Multidetector CT imaging of the chest, abdomen and pelvis was performed following the standard protocol without IV contrast. RADIATION DOSE REDUCTION: This exam was performed according to the departmental dose-optimization program which includes automated exposure control, adjustment of the mA and/or kV according to patient size and/or use of iterative reconstruction technique. COMPARISON:  January 17, 2022 FINDINGS: CT CHEST FINDINGS Cardiovascular: Calcified aortic atherosclerotic changes. No aneurysmal dilation of the thoracic aorta. Moderate cardiomegaly without pericardial effusion or nodularity. Three-vessel coronary artery disease. Central pulmonary vessels engorged to 3.5 cm diameter of main pulmonary artery. Mediastinum/Nodes:  RIGHT paratracheal lymph node (image 26/2) 9 mm previously 8 mm. Subcarinal lymph node (image 30/2) 16 mm as compared to 13 mm. Small AP window lymph nodes approximately 7 mm are not substantially changed compared to recent imaging from June of 2023. No internal mammary lymphadenopathy. No thoracic inlet or axillary lymphadenopathy. RIGHT thyroid nodule may measure as much as 1.9 cm (image 9/2) esophagus grossly normal. Lungs/Pleura: Loculated pleural fluid in the LEFT chest with similar volume in the lower chest, slightly increased volume in the upper chest and in the major fissure. No gross nodularity along pleural surfaces. No RIGHT-sided pleural effusion. Mild biapical scarring. Volume loss in the LEFT lower lobe with similar appearance to prior imaging. Airways are patent. Tiny LEFT upper lobe nodules are unchanged. Ground-glass nodule in the RIGHT upper lobe (image 76/4) 7-8 mm, likewise unchanged. Small Bochdalek's hernia on the RIGHT containing fat. Musculoskeletal: No chest wall mass. See below for full musculoskeletal details. CT ABDOMEN PELVIS FINDINGS Hepatobiliary:  Smooth hepatic contours. No pericholecystic stranding. No gross biliary duct distension. No visible lesion on noncontrast imaging. Pancreas: Pancreas with normal contours, no signs of inflammation or peripancreatic fluid. Spleen: Small low-density focus in the spleen is unchanged. Splenic contours are stable without splenic enlargement. Adrenals/Urinary Tract: Mild thickening of the LEFT adrenal is a stable finding. RIGHT adrenal is normal. 4 x 3.6 cm mass arising from posterior RIGHT kidney shown to have enhancement on previous imaging compatible with renal neoplasm. Cysts of the bilateral kidneys elsewhere and hemorrhagic cysts without substantial change. No hydronephrosis. No nephrolithiasis or ureteral calculi. Stomach/Bowel: Abundant formed stool in the rectum is unchanged and without perirectal stranding or signs of colonic obstruction.  Colostomy in the mid abdomen contains the long blind ending colon and ostomy is present at the level of the mid transverse colon. Peristomal herniation of the distal colon and fat. Adjacent wide-mouth hernia to the RIGHT of the midline contains the cecum and small bowel. These findings are unchanged and without signs of obstruction. No stranding adjacent to the stomach. Vascular/Lymphatic: Aortic atherosclerosis. No sign of aneurysm. Smooth contour of the IVC. There is no gastrohepatic or hepatoduodenal ligament lymphadenopathy. No retroperitoneal or mesenteric lymphadenopathy. No pelvic sidewall lymphadenopathy. Vascular structures with limited assessment due to lack of intravenous contrast. Reproductive: Unremarkable by CT. Other: Ventral abdominal wall hernias associated with the ostomy and adjacent to the ostomy to the RIGHT of the midline as discussed. No ascites. Mild stranding about the gluteal fold along its upper margin question of mild nodular skin thickening in this area. Musculoskeletal: Spinal degenerative changes. Inferior endplate concavity has developed at the T12 level since previous imaging. This is accounting for hypoplastic ribs at this level in terms of localization. Similar appearance of endplate concavity at L3 and L4. IMPRESSION: 1. Loculated pleural fluid in the LEFT chest with similar volume in the lower chest, slightly increased volume in the upper chest and in the major fissure. No gross nodularity along pleural surfaces. 2. Volume loss in the LEFT lower lobe with similar appearance to prior imaging. 3. Stable small pulmonary nodules in the chest and ground-glass nodule in the RIGHT upper chest. 4. Mild inferior endplate concavity at T12 may reflect recent fracture. Correlate with any symptoms in this location. 5. Mild stranding about the gluteal fold along its upper margin question of mild nodular skin thickening in this area. Correlate with any signs of skin breakdown or inflammation in  this location. 6. 4 x 3.6 cm mass arising from posterior RIGHT kidney compatible with renal neoplasm, suspicious for RCC. Size is similar to previous imaging. 7. Ventral abdominal wall hernias associated with the ostomy and adjacent to the ostomy to the RIGHT of the midline. These findings are unchanged and without signs of obstruction. 8. Abundant formed stool in the rectum is unchanged and without perirectal stranding or signs of colonic obstruction. 9. Three-vessel coronary artery disease. 10. Aortic atherosclerosis. 11. RIGHT thyroid nodule may measure as much as 1.9 cm. Recommend nonemergent thyroid US if not yet performed(ref: J Am Coll Radiol. 2015 Feb;12(2): 143-50). Electronically Signed   By: Zetta Bills M.D.   On: 04/25/2022 16:45    ASSESSMENT AND PLAN: This is a very pleasant 81 years old with a stage IV (TX, N3, M1c) non-small cell lung cancer, adenocarcinoma with positive EGFR mutation exon 21 (L858R) diagnosed in October 2022 1 presented with left primary lung lesion in addition to widespread metastatic disease in the thoracic nodes, left pleural space as well as  abdominal pelvic lymph node and bone metastasis. The patient is currently undergoing treatment with Tagrisso 80 mg p.o. daily started June 21, 2021.  She is status post 11 months of treatment.  The patient has been tolerating this treatment well with no concerning adverse effects. She had repeat CT scan of the chest, abdomen and pelvis performed recently.  I personally and independently reviewed the scan images and discussed the result with the patient today. Her scan showed no concerning finding for disease progression except for slightly increased from mediastinal lymph nodes that need close monitoring. The scan also showed the suspicious right kidney mass concerning for renal neoplasm and she is followed by urology.  She is currently on observation by Dr. Tresa Moore For the iron deficiency anemia.  This is secondary to  persistent gastrointestinal blood loss from AV malformation.  The patient received iron infusion with Venofer 300 Mg IV weekly for 3 weeks.  I will continue to monitor her iron study closely and consider The patient for repeat iron infusion if needed. The patient will come back for follow-up visit in 1 months for evaluation and repeat blood work. She was advised to call immediately if she has any other concerning symptoms in the interval. The patient voices understanding of current disease status and treatment options and is in agreement with the current care plan.  All questions were answered. The patient knows to call the clinic with any problems, questions or concerns. We can certainly see the patient much sooner if necessary.   Disclaimer: This note was dictated with voice recognition software. Similar sounding words can inadvertently be transcribed and may not be corrected upon review.

## 2022-04-27 ENCOUNTER — Other Ambulatory Visit: Payer: Self-pay

## 2022-04-29 ENCOUNTER — Other Ambulatory Visit: Payer: Self-pay

## 2022-05-09 ENCOUNTER — Telehealth: Payer: Self-pay | Admitting: Pharmacy Technician

## 2022-05-09 NOTE — Telephone Encounter (Signed)
Oral Oncology Patient Advocate Encounter   Received notification re-enrollment for assistance for Tagrisso through [employer] has been approved. Patient may continue to receive their medication at $0 from this program.    [employer] phone number [phone].   Effective dates: 05/09/22 through 07/30/23  [name], [place] Patient [place] Direct Number: [phone]  Fax: [phone]

## 2022-05-15 ENCOUNTER — Telehealth: Payer: Self-pay | Admitting: Internal Medicine

## 2022-05-15 NOTE — Telephone Encounter (Signed)
Called patient regarding upcoming 10/26 appointment, left a voicemail.

## 2022-05-21 ENCOUNTER — Other Ambulatory Visit: Payer: Self-pay | Admitting: Internal Medicine

## 2022-05-21 DIAGNOSIS — I5042 Chronic combined systolic (congestive) and diastolic (congestive) heart failure: Secondary | ICD-10-CM

## 2022-05-24 ENCOUNTER — Encounter: Payer: Self-pay | Admitting: Nutrition

## 2022-05-24 ENCOUNTER — Inpatient Hospital Stay: Payer: Medicare Other | Admitting: Internal Medicine

## 2022-05-24 ENCOUNTER — Other Ambulatory Visit: Payer: Self-pay | Admitting: Internal Medicine

## 2022-05-24 ENCOUNTER — Inpatient Hospital Stay: Payer: Medicare Other | Attending: Internal Medicine

## 2022-05-24 ENCOUNTER — Other Ambulatory Visit: Payer: Self-pay

## 2022-05-24 VITALS — BP 107/61 | HR 91 | Temp 98.1°F | Resp 18 | Ht 60.0 in

## 2022-05-24 DIAGNOSIS — C770 Secondary and unspecified malignant neoplasm of lymph nodes of head, face and neck: Secondary | ICD-10-CM | POA: Diagnosis not present

## 2022-05-24 DIAGNOSIS — I509 Heart failure, unspecified: Secondary | ICD-10-CM | POA: Insufficient documentation

## 2022-05-24 DIAGNOSIS — I10 Essential (primary) hypertension: Secondary | ICD-10-CM

## 2022-05-24 DIAGNOSIS — D500 Iron deficiency anemia secondary to blood loss (chronic): Secondary | ICD-10-CM | POA: Insufficient documentation

## 2022-05-24 DIAGNOSIS — N2889 Other specified disorders of kidney and ureter: Secondary | ICD-10-CM | POA: Insufficient documentation

## 2022-05-24 DIAGNOSIS — C3492 Malignant neoplasm of unspecified part of left bronchus or lung: Secondary | ICD-10-CM

## 2022-05-24 DIAGNOSIS — I110 Hypertensive heart disease with heart failure: Secondary | ICD-10-CM | POA: Insufficient documentation

## 2022-05-24 DIAGNOSIS — I480 Paroxysmal atrial fibrillation: Secondary | ICD-10-CM

## 2022-05-24 DIAGNOSIS — D649 Anemia, unspecified: Secondary | ICD-10-CM

## 2022-05-24 DIAGNOSIS — C782 Secondary malignant neoplasm of pleura: Secondary | ICD-10-CM | POA: Diagnosis not present

## 2022-05-24 DIAGNOSIS — C7951 Secondary malignant neoplasm of bone: Secondary | ICD-10-CM | POA: Insufficient documentation

## 2022-05-24 LAB — CBC WITH DIFFERENTIAL (CANCER CENTER ONLY)
Abs Immature Granulocytes: 0.03 10*3/uL (ref 0.00–0.07)
Basophils Absolute: 0 10*3/uL (ref 0.0–0.1)
Basophils Relative: 0 %
Eosinophils Absolute: 0.2 10*3/uL (ref 0.0–0.5)
Eosinophils Relative: 2 %
HCT: 28 % — ABNORMAL LOW (ref 36.0–46.0)
Hemoglobin: 8.8 g/dL — ABNORMAL LOW (ref 12.0–15.0)
Immature Granulocytes: 0 %
Lymphocytes Relative: 10 %
Lymphs Abs: 0.7 10*3/uL (ref 0.7–4.0)
MCH: 26.6 pg (ref 26.0–34.0)
MCHC: 31.4 g/dL (ref 30.0–36.0)
MCV: 84.6 fL (ref 80.0–100.0)
Monocytes Absolute: 0.7 10*3/uL (ref 0.1–1.0)
Monocytes Relative: 10 %
Neutro Abs: 5.5 10*3/uL (ref 1.7–7.7)
Neutrophils Relative %: 78 %
Platelet Count: 123 10*3/uL — ABNORMAL LOW (ref 150–400)
RBC: 3.31 MIL/uL — ABNORMAL LOW (ref 3.87–5.11)
RDW: 15.5 % (ref 11.5–15.5)
WBC Count: 7.1 10*3/uL (ref 4.0–10.5)
nRBC: 0 % (ref 0.0–0.2)

## 2022-05-24 LAB — CMP (CANCER CENTER ONLY)
ALT: 5 U/L (ref 0–44)
AST: 12 U/L — ABNORMAL LOW (ref 15–41)
Albumin: 3.6 g/dL (ref 3.5–5.0)
Alkaline Phosphatase: 64 U/L (ref 38–126)
Anion gap: 5 (ref 5–15)
BUN: 45 mg/dL — ABNORMAL HIGH (ref 8–23)
CO2: 32 mmol/L (ref 22–32)
Calcium: 9.6 mg/dL (ref 8.9–10.3)
Chloride: 100 mmol/L (ref 98–111)
Creatinine: 1.79 mg/dL — ABNORMAL HIGH (ref 0.44–1.00)
GFR, Estimated: 28 mL/min — ABNORMAL LOW (ref >60)
Glucose, Bld: 110 mg/dL — ABNORMAL HIGH (ref 70–99)
Potassium: 4.3 mmol/L (ref 3.5–5.1)
Sodium: 137 mmol/L (ref 135–145)
Total Bilirubin: 0.3 mg/dL (ref 0.3–1.2)
Total Protein: 7 g/dL (ref 6.5–8.1)

## 2022-05-24 LAB — IRON AND IRON BINDING CAPACITY (CC-WL,HP ONLY)
Iron: 25 ug/dL — ABNORMAL LOW (ref 28–170)
Saturation Ratios: 9 % — ABNORMAL LOW (ref 10.4–31.8)
TIBC: 267 ug/dL (ref 250–450)
UIBC: 242 ug/dL (ref 148–442)

## 2022-05-24 LAB — FERRITIN: Ferritin: 90 ng/mL (ref 11–307)

## 2022-05-24 LAB — SAMPLE TO BLOOD BANK

## 2022-05-24 NOTE — Progress Notes (Signed)
Provided one complimentary case of ensure plus HP.

## 2022-05-24 NOTE — Progress Notes (Signed)
[facility] Telephone:([phone]) [phone]   Fax:([phone]) [phone]  OFFICE PROGRESS NOTE  [name], MD [place] [address]  DIAGNOSIS:  1) Stage IV (Tx, N3, M1C) non-small cell lung cancer, adenocarcinoma. She presented with a left primary lung lesion and widespread metastatic disease in the thoracic nodes, left pleural space, abdomen pelvic lymph nodes, and bones.  She was diagnosed in October 2022. 2) suspicious right renal cell carcinoma followed by Dr. Tammi Klippel from [facility]  Molecular studies: The patient has positive EGFR mutation in exon 21 (L858R)   PRIOR THERAPY: Iron infusion with Venofer 300 Mg IV weekly for 3 weeks for the iron deficiency anemia   CURRENT THERAPY: Targeted treatment with Tagrisso 80 mg p.o. daily.  First dose started on June 21, 2021.  Status post 12 months.  INTERVAL HISTORY: [name] 81 y.o. female returns to the clinic today for follow-up visit accompanied by her granddaughter.  The patient is feeling fine today with no concerning complaints except for the baseline fatigue and shortness of breath with exertion.  She denied having any current chest pain, cough or hemoptysis.  She has no nausea, vomiting, diarrhea or constipation.  She has no headache or visual changes.  She was treated with iron infusion in the past.  She has been tolerating her treatment with Tagrisso fairly well.  She is here today for evaluation and repeat blood work.   MEDICAL HISTORY: Past Medical History:  Diagnosis Date   Allergy    Angiodysplasia of cecum    Angiodysplasia of duodenum    Arthritis    Asteatotic eczema 02/25/2016   Asthma    Atrial fibrillation ([place]) 10/17/2017   Blood transfusion without reported diagnosis    Cataract    CHF (congestive heart failure), NYHA class III, chronic, combined ([place]) 08/10/2017   Colonic polyp 02/16/2008   Tubular adenoma   COPD (chronic obstructive pulmonary disease) ([place])  01/27/1998   Cor pulmonale ([place]) 11/16/2014   GERD 07/30/1992   Gout    HTN (hypertension) 07/30/1988   Hyperlipidemia with target LDL less than 100 11/27/1996        Kidney stone 1960, 1972, 1991   lung ca 04/2021   Medical history non-contributory    MGUS (monoclonal gammopathy of unknown significance) 11/08/2016   Morbid obesity ([place]) 04/19/2010   She agrees to work on her lifestyle modifications to help her lose weight.    OSA (obstructive sleep apnea) 06/22/2013   Osteopenia, senile 02/05/2013   July 2014  -2.1 left femur -1.9 left forearm    Prediabetes 11/13/2006   Renal insufficiency    Stenosis of cervical spine with myelopathy ([place]) 01/01/2018   Supplemental oxygen dependent    2L via [place]   Ulcer of leg, chronic, right ([place]) 10/10/2011   ULCERATIVE COLITIS-LEFT SIDE 03/19/2008        Vitamin B12 deficiency anemia 11/13/2006        Vitamin D deficiency 02/06/2013    ALLERGIES:  is allergic to celebrex [celecoxib], amlodipine besylate, enalapril, lipitor [atorvastatin], trelegy ellipta [fluticasone-umeclidin-vilant], amoxicillin, codeine sulfate, hydrocodone-acetaminophen, and tape.  MEDICATIONS:  Current Outpatient Medications  Medication Sig Dispense Refill   acetaminophen (TYLENOL) 325 MG tablet Take 325-650 mg by mouth daily as needed for moderate pain, fever or headache.      albuterol (PROAIR HFA) 108 (90 Base) MCG/ACT inhaler Inhale 1-2 puffs into the lungs every 6 (six) hours as needed for wheezing or shortness of  breath. 18 g 11   allopurinol (ZYLOPRIM) 100 MG tablet Take 1 tablet (100 mg total) by mouth daily. 90 tablet 1   BREO ELLIPTA 100-25 MCG/ACT AEPB INHALE 1 PUFF INTO THE LUNGS DAILY 120 each 1   clonazePAM (KLONOPIN) 1 MG tablet Take 1 tablet (1 mg total) by mouth 2 (two) times daily as needed for anxiety. (Patient taking differently: Take 0.5-1 mg by mouth 2 (two) times daily as needed for anxiety.) 180 tablet 0   cyanocobalamin 2000 MCG tablet Take 1  tablet (2,000 mcg total) by mouth daily. 90 tablet 3   ferrous sulfate 325 (65 FE) MG tablet Take 1 tablet (325 mg total) by mouth 2 (two) times daily with a meal. 180 tablet 1   isosorbide-hydrALAZINE (BIDIL) 20-37.5 MG tablet TAKE 1 TABLET BY MOUTH THREE TIMES DAILY 90 tablet 0   metoprolol tartrate (LOPRESSOR) 25 MG tablet TAKE 1 TABLET(25 MG) BY MOUTH TWICE DAILY 180 tablet 1   morphine (MS CONTIN) 15 MG 12 hr tablet Take 1 tablet (15 mg total) by mouth 2 (two) times daily as needed for pain. 60 tablet 0   Nutritional Supplements (CARNATION BREAKFAST ESSENTIALS) LIQD Take 237 mLs by mouth daily.     nystatin (MYCOSTATIN/NYSTOP) powder Apply 1 application. topically 3 (three) times daily. 60 g 0   osimertinib mesylate (TAGRISSO) 80 MG tablet Take 1 tablet (80 mg total) by mouth daily. 30 tablet 3   OXYGEN Inhale 2 L/min into the lungs continuous.     prochlorperazine (COMPAZINE) 10 MG tablet Take 1 tablet (10 mg total) by mouth every 6 (six) hours as needed. (Patient taking differently: Take 10 mg by mouth every 6 (six) hours as needed for nausea or vomiting.) 30 tablet 2   protein supplement shake (PREMIER PROTEIN) LIQD Take 11 oz by mouth 2 (two) times daily between meals.     torsemide (DEMADEX) 20 MG tablet TAKE 2 TABLETS(40 MG) BY MOUTH DAILY 180 tablet 1   No current facility-administered medications for this visit.    SURGICAL HISTORY:  Past Surgical History:  Procedure Laterality Date   BRONCHOSCOPY     CHEST TUBE INSERTION Left 06/15/2021   Procedure: INSERTION PLEURAL DRAINAGE CATHETER;  Surgeon: [name], MD;  Location: [facility];  Service: Thoracic;  Laterality: Left;   COLONOSCOPY     COLONOSCOPY WITH PROPOFOL N/A 02/23/2015   Procedure: COLONOSCOPY WITH PROPOFOL;  Surgeon: Ladene Artist, MD;  Location: [facility];  Service: Endoscopy;  Laterality: N/A;   COLONOSCOPY WITH PROPOFOL N/A 08/12/2018   Procedure: COLONOSCOPY WITH PROPOFOL;  Surgeon: Ladene Artist,  MD;  Location: [facility];  Service: Endoscopy;  Laterality: N/A;   COLONOSCOPY WITH PROPOFOL N/A 05/09/2020   Procedure: COLONOSCOPY WITH PROPOFOL;  Surgeon: Ladene Artist, MD;  Location: [facility];  Service: Endoscopy;  Laterality: N/A;  To Splenic Flexture   COLONOSCOPY WITH PROPOFOL N/A 11/23/2021   Procedure: COLONOSCOPY WITH PROPOFOL;  Surgeon: Mauri Pole, MD;  Location: [facility];  Service: Gastroenterology;  Laterality: N/A;   ESOPHAGOGASTRODUODENOSCOPY N/A 11/23/2021   Procedure: ESOPHAGOGASTRODUODENOSCOPY (EGD);  Surgeon: Mauri Pole, MD;  Location: [facility];  Service: Gastroenterology;  Laterality: N/A;   ESOPHAGOGASTRODUODENOSCOPY (EGD) WITH PROPOFOL N/A 02/23/2015   Procedure: ESOPHAGOGASTRODUODENOSCOPY (EGD) WITH PROPOFOL;  Surgeon: Ladene Artist, MD;  [facility]: [facility];  Service: Endoscopy;  Laterality: N/A;   ESOPHAGOGASTRODUODENOSCOPY (EGD) WITH PROPOFOL N/A 08/12/2018   Procedure: ESOPHAGOGASTRODUODENOSCOPY (EGD) WITH PROPOFOL;  Surgeon: Ladene Artist, MD;  [facility]: [facility];  Service: Endoscopy;  Laterality: N/A;   ESOPHAGOGASTRODUODENOSCOPY (EGD) WITH PROPOFOL N/A 05/08/2020   Procedure: ESOPHAGOGASTRODUODENOSCOPY (EGD) WITH PROPOFOL;  Surgeon: Yetta Flock, MD;  Location: [facility];  Service: Gastroenterology;  Laterality: N/A;   HEMOSTASIS CLIP PLACEMENT  11/23/2021   Procedure: HEMOSTASIS CLIP PLACEMENT;  Surgeon: Mauri Pole, MD;  Location: [facility];  Service: Gastroenterology;;   HOT HEMOSTASIS N/A 08/12/2018   Procedure: HOT HEMOSTASIS (ARGON PLASMA COAGULATION/BICAP);  Surgeon: Ladene Artist, MD;  Location: [facility];  Service: Endoscopy;  Laterality: N/A;  EGD and Colon APC   HOT HEMOSTASIS N/A 05/08/2020   Procedure: HOT HEMOSTASIS (ARGON PLASMA COAGULATION/BICAP);  Surgeon: Yetta Flock, MD;  Location: [facility];  Service: Gastroenterology;  Laterality: N/A;   HOT HEMOSTASIS N/A 11/23/2021    Procedure: HOT HEMOSTASIS (ARGON PLASMA COAGULATION/BICAP);  Surgeon: Mauri Pole, MD;  Location: [facility];  Service: Gastroenterology;  Laterality: N/A;   LAPAROTOMY N/A 08/11/2020   Procedure: EXPLORATORY LAPAROTOMY PARTIAL COLECTOMY AND COLOSTOMY WITH RESECTION OF A MESSENTERIC MASS;  Surgeon: Coralie Keens, MD;  Location: [facility];  Service: General;  Laterality: N/A;   POLYPECTOMY     REMOVAL OF PLEURAL DRAINAGE CATHETER N/A 10/17/2021   Procedure: MINOR REMOVAL OF PLEURAL DRAINAGE CATHETER;  Surgeon: [name], MD;  Location: [facility];  Service: Cardiopulmonary;  Laterality: N/A;    REVIEW OF SYSTEMS:  A comprehensive review of systems was negative except for: Constitutional: positive for fatigue Respiratory: positive for dyspnea on exertion Musculoskeletal: positive for muscle weakness   PHYSICAL EXAMINATION: General appearance: alert, cooperative, fatigued, and no distress Head: Normocephalic, without obvious abnormality, atraumatic Neck: no adenopathy, no JVD, supple, symmetrical, trachea midline, and thyroid not enlarged, symmetric, no tenderness/mass/nodules Lymph nodes: Cervical, supraclavicular, and axillary nodes normal. Resp: clear to auscultation bilaterally Back: symmetric, no curvature. ROM normal. No CVA tenderness. Cardio: regular rate and rhythm, S1, S2 normal, no murmur, click, rub or gallop GI: soft, non-tender; bowel sounds normal; no masses,  no organomegaly Extremities: extremities normal, atraumatic, no cyanosis or edema  ECOG PERFORMANCE STATUS: 1 - Symptomatic but completely ambulatory  Blood pressure 107/61, pulse 91, temperature 98.1 F (36.7 C), temperature source Oral, resp. rate 18, height 5' (1.524 m), SpO2 96 %.  LABORATORY DATA: Lab Results  Component Value Date   WBC 7.1 05/24/2022   HGB 8.8 (L) 05/24/2022   HCT 28.0 (L) 05/24/2022   MCV 84.6 05/24/2022   PLT 123 (L) 05/24/2022      Chemistry      Component  Value Date/Time   NA 137 05/24/2022 1322   NA 142 04/11/2016 1007   K 4.3 05/24/2022 1322   K 3.3 (L) 04/11/2016 1007   CL 100 05/24/2022 1322   CO2 32 05/24/2022 1322   CO2 31 (H) 04/11/2016 1007   BUN 45 (H) 05/24/2022 1322   BUN 24.4 04/11/2016 1007   CREATININE 1.79 (H) 05/24/2022 1322   CREATININE 1.36 (H) 02/04/2020 1443   CREATININE 1.1 04/11/2016 1007      Component Value Date/Time   CALCIUM 9.6 05/24/2022 1322   CALCIUM 9.8 04/11/2016 1007   ALKPHOS 64 05/24/2022 1322   ALKPHOS 82 04/11/2016 1007   AST 12 (L) 05/24/2022 1322   AST 12 04/11/2016 1007   ALT 5 05/24/2022 1322   ALT 12 04/11/2016 1007   BILITOT 0.3 05/24/2022 1322   BILITOT 0.35 04/11/2016 1007       RADIOGRAPHIC STUDIES: No results found.  ASSESSMENT AND PLAN: This is a very pleasant 81 years old with a stage IV (TX, N3, M1c) non-small cell lung cancer, adenocarcinoma with positive EGFR mutation exon 21 (L858R) diagnosed in October 2022 1 presented with left primary lung lesion in addition to widespread metastatic disease in the thoracic nodes, left pleural space as well as abdominal pelvic lymph node and bone metastasis. The patient is currently undergoing treatment with Tagrisso 80 mg p.o. daily started June 21, 2021.  She is status post 12 months of treatment.  She has been tolerating this treatment fairly well with no concerning adverse effects. I recommended for the patient to continue her current treatment with Tagrisso with the same dose. I will see her back for follow-up visit in 1 months for evaluation and repeat blood work. The scan also showed the suspicious right kidney mass concerning for renal neoplasm and she is followed by urology.  She is currently on observation by Dr. Tresa Moore For the iron deficiency anemia.  This is secondary to persistent gastrointestinal blood loss from AV malformation.  Iron studies today showed iron deficiency. I will arrange for the patient to receive iron  infusion with Venofer 300 Mg IV weekly for 3 weeks starting next week. The patient was advised to call immediately if she has any other concerning symptoms in the interval. The patient voices understanding of current disease status and treatment options and is in agreement with the current care plan.  All questions were answered. The patient knows to call the clinic with any problems, questions or concerns. We can certainly see the patient much sooner if necessary.   Disclaimer: This note was dictated with voice recognition software. Similar sounding words can inadvertently be transcribed and may not be corrected upon review.

## 2022-05-25 ENCOUNTER — Telehealth: Payer: Self-pay | Admitting: Internal Medicine

## 2022-05-25 ENCOUNTER — Other Ambulatory Visit: Payer: Self-pay

## 2022-05-25 NOTE — Telephone Encounter (Signed)
Scheduled per 10/26 los, patient has been called and voicemail was left.

## 2022-05-26 ENCOUNTER — Other Ambulatory Visit: Payer: Self-pay

## 2022-05-28 ENCOUNTER — Other Ambulatory Visit: Payer: Self-pay | Admitting: Physician Assistant

## 2022-05-28 ENCOUNTER — Other Ambulatory Visit: Payer: Self-pay | Admitting: Internal Medicine

## 2022-05-28 DIAGNOSIS — I10 Essential (primary) hypertension: Secondary | ICD-10-CM

## 2022-05-28 DIAGNOSIS — I5042 Chronic combined systolic (congestive) and diastolic (congestive) heart failure: Secondary | ICD-10-CM

## 2022-06-07 ENCOUNTER — Other Ambulatory Visit: Payer: Self-pay

## 2022-06-07 DIAGNOSIS — J910 Malignant pleural effusion: Secondary | ICD-10-CM | POA: Diagnosis not present

## 2022-06-07 DIAGNOSIS — Z933 Colostomy status: Secondary | ICD-10-CM | POA: Diagnosis not present

## 2022-06-07 DIAGNOSIS — C3490 Malignant neoplasm of unspecified part of unspecified bronchus or lung: Secondary | ICD-10-CM | POA: Diagnosis not present

## 2022-06-08 ENCOUNTER — Other Ambulatory Visit: Payer: Self-pay

## 2022-06-11 ENCOUNTER — Other Ambulatory Visit: Payer: Self-pay | Admitting: Medical Oncology

## 2022-06-11 DIAGNOSIS — C3492 Malignant neoplasm of unspecified part of left bronchus or lung: Secondary | ICD-10-CM

## 2022-06-11 MED ORDER — OSIMERTINIB MESYLATE 80 MG PO TABS: 80.0000 mg | ORAL_TABLET | Freq: Every day | ORAL | 3 refills | Status: AC

## 2022-06-14 ENCOUNTER — Telehealth: Payer: Self-pay | Admitting: Internal Medicine

## 2022-06-14 NOTE — Telephone Encounter (Signed)
Rescheduled 11/30 appointment to 12/05 per provider pal, patient has been called and notified of upcoming rescheduled appointment.

## 2022-06-26 ENCOUNTER — Other Ambulatory Visit: Payer: Self-pay | Admitting: Internal Medicine

## 2022-06-26 DIAGNOSIS — I5042 Chronic combined systolic (congestive) and diastolic (congestive) heart failure: Secondary | ICD-10-CM

## 2022-06-28 ENCOUNTER — Other Ambulatory Visit: Payer: Medicare Other

## 2022-06-28 ENCOUNTER — Ambulatory Visit: Payer: Medicare Other | Admitting: Internal Medicine

## 2022-07-03 ENCOUNTER — Encounter (HOSPITAL_COMMUNITY): Payer: Self-pay

## 2022-07-03 ENCOUNTER — Other Ambulatory Visit: Payer: Self-pay

## 2022-07-03 ENCOUNTER — Emergency Department (HOSPITAL_COMMUNITY): Payer: Medicare Other

## 2022-07-03 ENCOUNTER — Inpatient Hospital Stay: Payer: Medicare Other | Attending: Internal Medicine

## 2022-07-03 ENCOUNTER — Inpatient Hospital Stay: Payer: Medicare Other | Admitting: Internal Medicine

## 2022-07-03 ENCOUNTER — Emergency Department (HOSPITAL_COMMUNITY)
Admission: EM | Admit: 2022-07-03 | Discharge: 2022-07-03 | Disposition: A | Discharge status: Home / Self Care | Payer: Medicare Other | Source: Home / Self Care | Attending: Emergency Medicine | Admitting: Emergency Medicine

## 2022-07-03 VITALS — BP 122/56 | HR 134 | Temp 98.7°F | Resp 15

## 2022-07-03 DIAGNOSIS — D509 Iron deficiency anemia, unspecified: Secondary | ICD-10-CM | POA: Insufficient documentation

## 2022-07-03 DIAGNOSIS — R0602 Shortness of breath: Secondary | ICD-10-CM | POA: Insufficient documentation

## 2022-07-03 DIAGNOSIS — E1122 Type 2 diabetes mellitus with diabetic chronic kidney disease: Secondary | ICD-10-CM | POA: Diagnosis not present

## 2022-07-03 DIAGNOSIS — I480 Paroxysmal atrial fibrillation: Secondary | ICD-10-CM | POA: Insufficient documentation

## 2022-07-03 DIAGNOSIS — D638 Anemia in other chronic diseases classified elsewhere: Secondary | ICD-10-CM | POA: Diagnosis not present

## 2022-07-03 DIAGNOSIS — R531 Weakness: Secondary | ICD-10-CM | POA: Diagnosis not present

## 2022-07-03 DIAGNOSIS — C3492 Malignant neoplasm of unspecified part of left bronchus or lung: Secondary | ICD-10-CM | POA: Diagnosis not present

## 2022-07-03 DIAGNOSIS — E875 Hyperkalemia: Secondary | ICD-10-CM | POA: Diagnosis not present

## 2022-07-03 DIAGNOSIS — Z1152 Encounter for screening for COVID-19: Secondary | ICD-10-CM | POA: Insufficient documentation

## 2022-07-03 DIAGNOSIS — I509 Heart failure, unspecified: Secondary | ICD-10-CM | POA: Insufficient documentation

## 2022-07-03 DIAGNOSIS — J90 Pleural effusion, not elsewhere classified: Secondary | ICD-10-CM | POA: Diagnosis not present

## 2022-07-03 DIAGNOSIS — Z515 Encounter for palliative care: Secondary | ICD-10-CM | POA: Diagnosis not present

## 2022-07-03 DIAGNOSIS — C7951 Secondary malignant neoplasm of bone: Secondary | ICD-10-CM | POA: Insufficient documentation

## 2022-07-03 DIAGNOSIS — D472 Monoclonal gammopathy: Secondary | ICD-10-CM | POA: Diagnosis not present

## 2022-07-03 DIAGNOSIS — L89326 Pressure-induced deep tissue damage of left buttock: Secondary | ICD-10-CM | POA: Diagnosis not present

## 2022-07-03 DIAGNOSIS — C3490 Malignant neoplasm of unspecified part of unspecified bronchus or lung: Secondary | ICD-10-CM | POA: Insufficient documentation

## 2022-07-03 DIAGNOSIS — N1832 Chronic kidney disease, stage 3b: Secondary | ICD-10-CM | POA: Diagnosis not present

## 2022-07-03 DIAGNOSIS — R000 Tachycardia, unspecified: Secondary | ICD-10-CM | POA: Diagnosis not present

## 2022-07-03 DIAGNOSIS — G8194 Hemiplegia, unspecified affecting left nondominant side: Secondary | ICD-10-CM | POA: Diagnosis not present

## 2022-07-03 DIAGNOSIS — I130 Hypertensive heart and chronic kidney disease with heart failure and stage 1 through stage 4 chronic kidney disease, or unspecified chronic kidney disease: Secondary | ICD-10-CM | POA: Diagnosis not present

## 2022-07-03 DIAGNOSIS — J910 Malignant pleural effusion: Secondary | ICD-10-CM | POA: Diagnosis not present

## 2022-07-03 DIAGNOSIS — Z66 Do not resuscitate: Secondary | ICD-10-CM | POA: Diagnosis not present

## 2022-07-03 DIAGNOSIS — M8580 Other specified disorders of bone density and structure, unspecified site: Secondary | ICD-10-CM | POA: Diagnosis not present

## 2022-07-03 DIAGNOSIS — Z933 Colostomy status: Secondary | ICD-10-CM | POA: Diagnosis not present

## 2022-07-03 DIAGNOSIS — J441 Chronic obstructive pulmonary disease with (acute) exacerbation: Secondary | ICD-10-CM | POA: Diagnosis not present

## 2022-07-03 DIAGNOSIS — Z5181 Encounter for therapeutic drug level monitoring: Secondary | ICD-10-CM

## 2022-07-03 DIAGNOSIS — I4891 Unspecified atrial fibrillation: Secondary | ICD-10-CM | POA: Diagnosis not present

## 2022-07-03 DIAGNOSIS — J9621 Acute and chronic respiratory failure with hypoxia: Secondary | ICD-10-CM | POA: Diagnosis not present

## 2022-07-03 DIAGNOSIS — L89316 Pressure-induced deep tissue damage of right buttock: Secondary | ICD-10-CM | POA: Diagnosis not present

## 2022-07-03 DIAGNOSIS — I639 Cerebral infarction, unspecified: Secondary | ICD-10-CM | POA: Diagnosis not present

## 2022-07-03 DIAGNOSIS — I959 Hypotension, unspecified: Secondary | ICD-10-CM | POA: Diagnosis not present

## 2022-07-03 DIAGNOSIS — R079 Chest pain, unspecified: Secondary | ICD-10-CM | POA: Diagnosis not present

## 2022-07-03 DIAGNOSIS — R059 Cough, unspecified: Secondary | ICD-10-CM | POA: Diagnosis not present

## 2022-07-03 DIAGNOSIS — Z20822 Contact with and (suspected) exposure to covid-19: Secondary | ICD-10-CM | POA: Diagnosis not present

## 2022-07-03 DIAGNOSIS — T508X5A Adverse effect of diagnostic agents, initial encounter: Secondary | ICD-10-CM | POA: Diagnosis not present

## 2022-07-03 DIAGNOSIS — I5033 Acute on chronic diastolic (congestive) heart failure: Secondary | ICD-10-CM | POA: Diagnosis not present

## 2022-07-03 LAB — CBC WITH DIFFERENTIAL (CANCER CENTER ONLY)
Abs Immature Granulocytes: 0.08 10*3/uL — ABNORMAL HIGH (ref 0.00–0.07)
Basophils Absolute: 0 10*3/uL (ref 0.0–0.1)
Basophils Relative: 0 %
Eosinophils Absolute: 0.1 10*3/uL (ref 0.0–0.5)
Eosinophils Relative: 1 %
HCT: 29.1 % — ABNORMAL LOW (ref 36.0–46.0)
Hemoglobin: 9.3 g/dL — ABNORMAL LOW (ref 12.0–15.0)
Immature Granulocytes: 1 %
Lymphocytes Relative: 4 %
Lymphs Abs: 0.7 10*3/uL (ref 0.7–4.0)
MCH: 26.5 pg (ref 26.0–34.0)
MCHC: 32 g/dL (ref 30.0–36.0)
MCV: 82.9 fL (ref 80.0–100.0)
Monocytes Absolute: 1.5 10*3/uL — ABNORMAL HIGH (ref 0.1–1.0)
Monocytes Relative: 9 %
Neutro Abs: 13.9 10*3/uL — ABNORMAL HIGH (ref 1.7–7.7)
Neutrophils Relative %: 85 %
Platelet Count: 262 10*3/uL (ref 150–400)
RBC: 3.51 MIL/uL — ABNORMAL LOW (ref 3.87–5.11)
RDW: 14.1 % (ref 11.5–15.5)
WBC Count: 16.3 10*3/uL — ABNORMAL HIGH (ref 4.0–10.5)
nRBC: 0 % (ref 0.0–0.2)

## 2022-07-03 LAB — CMP (CANCER CENTER ONLY)
ALT: 11 U/L (ref 0–44)
AST: 19 U/L (ref 15–41)
Albumin: 3.4 g/dL — ABNORMAL LOW (ref 3.5–5.0)
Alkaline Phosphatase: 92 U/L (ref 38–126)
Anion gap: 9 (ref 5–15)
BUN: 49 mg/dL — ABNORMAL HIGH (ref 8–23)
CO2: 31 mmol/L (ref 22–32)
Calcium: 10.4 mg/dL — ABNORMAL HIGH (ref 8.9–10.3)
Chloride: 96 mmol/L — ABNORMAL LOW (ref 98–111)
Creatinine: 1.67 mg/dL — ABNORMAL HIGH (ref 0.44–1.00)
GFR, Estimated: 31 mL/min — ABNORMAL LOW (ref >60)
Glucose, Bld: 189 mg/dL — ABNORMAL HIGH (ref 70–99)
Potassium: 4 mmol/L (ref 3.5–5.1)
Sodium: 136 mmol/L (ref 135–145)
Total Bilirubin: 0.4 mg/dL (ref 0.3–1.2)
Total Protein: 7.7 g/dL (ref 6.5–8.1)

## 2022-07-03 LAB — BRAIN NATRIURETIC PEPTIDE: B Natriuretic Peptide: 238.4 pg/mL — ABNORMAL HIGH (ref 0.0–100.0)

## 2022-07-03 LAB — RESP PANEL BY RT-PCR (FLU A&B, COVID) ARPGX2
Influenza A by PCR: NEGATIVE
Influenza B by PCR: NEGATIVE
SARS Coronavirus 2 by RT PCR: NEGATIVE

## 2022-07-03 LAB — LACTIC ACID, PLASMA: Lactic Acid, Venous: 0.9 mmol/L (ref 0.5–1.9)

## 2022-07-03 MED ORDER — SODIUM CHLORIDE 0.9 % IV SOLN: Freq: Once | INTRAVENOUS | Status: AC

## 2022-07-03 MED ORDER — IOHEXOL 350 MG/ML SOLN
60.0000 mL | Freq: Once | INTRAVENOUS | Status: AC | PRN
  Administered 2022-07-03: 60 mL via INTRAVENOUS

## 2022-07-03 NOTE — Progress Notes (Signed)
[place] [phone]   [phone]  OFFICE PROGRESS NOTE  Laurie Lima, MD [place] [address]  DIAGNOSIS:  1) Stage IV (Tx, N3, M1C) non-small cell lung cancer, adenocarcinoma. She presented with a left primary lung lesion and widespread metastatic disease in the thoracic nodes, left pleural space, abdomen pelvic lymph nodes, and bones.  She was diagnosed in October 2022. 2) suspicious right renal cell carcinoma followed by Dr. Tammi Klippel from [facility] urology  Molecular studies: The patient has positive EGFR mutation in exon 21 ([id number])   PRIOR THERAPY: Iron infusion with Venofer 300 Mg IV weekly for 3 weeks for the iron deficiency anemia   CURRENT THERAPY: Targeted treatment with Tagrisso 80 mg p.o. daily.  First dose started on June 21, 2021.  Status post 13 months.  INTERVAL HISTORY: [name] 81 y.o. female returns to the clinic today for follow-up visit accompanied by her sister.  The patient is not feeling well today with significant fatigue and weakness as well as low-grade fever over the last few days.  She also has cough with mild sputum production.  She has no hemoptysis.  She continues to have pain on the right side of the chest which is worse with deep breathing.  Her oxygen saturation was 88% in the clinic.  Her heart rate was also elevated up to 140 which was irregular.  The patient denied having any current nausea, vomiting, diarrhea or constipation.  The patient has no headache or visual changes.  She was here for routine evaluation and blood work.  MEDICAL HISTORY: Past Medical History:  Diagnosis Date   Allergy    Angiodysplasia of cecum    Angiodysplasia of duodenum    Arthritis    Asteatotic eczema 02/25/2016   Asthma    Atrial fibrillation ([place]) 10/17/2017   Blood transfusion without reported diagnosis    Cataract    CHF (congestive heart failure), NYHA class III, chronic, combined ([place])  08/10/2017   Colonic polyp 02/16/2008   Tubular adenoma   COPD (chronic obstructive pulmonary disease) ([place]) 01/27/1998   Cor pulmonale ([place]) 11/16/2014   GERD 07/30/1992   Gout    HTN (hypertension) 07/30/1988   Hyperlipidemia with target LDL less than 100 11/27/1996        Kidney stone 1960, 1972, 1991   lung ca 04/2021   Medical history non-contributory    MGUS (monoclonal gammopathy of unknown significance) 11/08/2016   Morbid obesity ([place]) 04/19/2010   She agrees to work on her lifestyle modifications to help her lose weight.    OSA (obstructive sleep apnea) 06/22/2013   Osteopenia, senile 02/05/2013   July 2014  -2.1 left femur -1.9 left forearm    Prediabetes 11/13/2006   Renal insufficiency    Stenosis of cervical spine with myelopathy ([place]) 01/01/2018   Supplemental oxygen dependent    2L via [place]   Ulcer of leg, chronic, right ([place]) 10/10/2011   ULCERATIVE COLITIS-LEFT SIDE 03/19/2008        Vitamin B12 deficiency anemia 11/13/2006        Vitamin D deficiency 02/06/2013    ALLERGIES:  is allergic to celebrex [celecoxib], amlodipine besylate, enalapril, lipitor [atorvastatin], trelegy ellipta [fluticasone-umeclidin-vilant], amoxicillin, codeine sulfate, hydrocodone-acetaminophen, and tape.  MEDICATIONS:  Current Outpatient Medications  Medication Sig Dispense Refill   acetaminophen (TYLENOL) 325 MG tablet Take 325-650 mg by mouth daily as needed for moderate pain, fever or headache.  albuterol (PROAIR HFA) 108 (90 Base) MCG/ACT inhaler Inhale 1-2 puffs into the lungs every 6 (six) hours as needed for wheezing or shortness of breath. 18 g 11   allopurinol (ZYLOPRIM) 100 MG tablet Take 1 tablet (100 mg total) by mouth daily. 90 tablet 1   BREO ELLIPTA 100-25 MCG/ACT AEPB INHALE 1 PUFF INTO THE LUNGS DAILY 120 each 1   clonazePAM (KLONOPIN) 1 MG tablet Take 1 tablet (1 mg total) by mouth 2 (two) times daily as needed for anxiety. (Patient taking differently: Take  0.5-1 mg by mouth 2 (two) times daily as needed for anxiety.) 180 tablet 0   cyanocobalamin 2000 MCG tablet Take 1 tablet (2,000 mcg total) by mouth daily. 90 tablet 3   ferrous sulfate 325 (65 FE) MG tablet Take 1 tablet (325 mg total) by mouth 2 (two) times daily with a meal. 180 tablet 1   isosorbide-hydrALAZINE (BIDIL) 20-37.5 MG tablet TAKE 1 TABLET BY MOUTH THREE TIMES DAILY 90 tablet 0   metoprolol tartrate (LOPRESSOR) 25 MG tablet TAKE 1 TABLET(25 MG) BY MOUTH TWICE DAILY 180 tablet 1   morphine (MS CONTIN) 15 MG 12 hr tablet Take 1 tablet (15 mg total) by mouth 2 (two) times daily as needed for pain. 60 tablet 0   Nutritional Supplements (CARNATION BREAKFAST ESSENTIALS) LIQD Take 237 mLs by mouth daily.     nystatin (MYCOSTATIN/NYSTOP) powder Apply 1 application. topically 3 (three) times daily. 60 g 0   osimertinib mesylate (TAGRISSO) 80 MG tablet Take 1 tablet (80 mg total) by mouth daily. 30 tablet 3   OXYGEN Inhale 2 L/min into the lungs continuous.     prochlorperazine (COMPAZINE) 10 MG tablet Take 1 tablet (10 mg total) by mouth every 6 (six) hours as needed. (Patient taking differently: Take 10 mg by mouth every 6 (six) hours as needed for nausea or vomiting.) 30 tablet 2   protein supplement shake (PREMIER PROTEIN) LIQD Take 11 oz by mouth 2 (two) times daily between meals.     torsemide (DEMADEX) 20 MG tablet TAKE 2 TABLETS(40 MG) BY MOUTH DAILY 180 tablet 0   No current facility-administered medications for this visit.    SURGICAL HISTORY:  Past Surgical History:  Procedure Laterality Date   BRONCHOSCOPY     CHEST TUBE INSERTION Left 06/15/2021   Procedure: INSERTION PLEURAL DRAINAGE CATHETER;  Surgeon: [name], MD;  Location: [place];  Service: Thoracic;  Laterality: Left;   COLONOSCOPY     COLONOSCOPY WITH PROPOFOL N/A 02/23/2015   Procedure: COLONOSCOPY WITH PROPOFOL;  Surgeon: Ladene Artist, MD;  Location: [facility];  Service: Endoscopy;  Laterality:  N/A;   COLONOSCOPY WITH PROPOFOL N/A 08/12/2018   Procedure: COLONOSCOPY WITH PROPOFOL;  Surgeon: Ladene Artist, MD;  Location: [facility];  Service: Endoscopy;  Laterality: N/A;   COLONOSCOPY WITH PROPOFOL N/A 05/09/2020   Procedure: COLONOSCOPY WITH PROPOFOL;  Surgeon: Ladene Artist, MD;  Location: [facility];  Service: Endoscopy;  Laterality: N/A;  To Splenic Flexture   COLONOSCOPY WITH PROPOFOL N/A 11/23/2021   Procedure: COLONOSCOPY WITH PROPOFOL;  Surgeon: Mauri Pole, MD;  Location: [facility];  Service: Gastroenterology;  Laterality: N/A;   ESOPHAGOGASTRODUODENOSCOPY N/A 11/23/2021   Procedure: ESOPHAGOGASTRODUODENOSCOPY (EGD);  Surgeon: Mauri Pole, MD;  Location: [facility];  Service: Gastroenterology;  Laterality: N/A;   ESOPHAGOGASTRODUODENOSCOPY (EGD) WITH PROPOFOL N/A 02/23/2015   Procedure: ESOPHAGOGASTRODUODENOSCOPY (EGD) WITH PROPOFOL;  Surgeon: Ladene Artist, MD;  [facility];  Service: Endoscopy;  Laterality: N/A;   ESOPHAGOGASTRODUODENOSCOPY (EGD) WITH PROPOFOL N/A 08/12/2018   Procedure: ESOPHAGOGASTRODUODENOSCOPY (EGD) WITH PROPOFOL;  Surgeon: Ladene Artist, MD;  [facility];  Service: Endoscopy;  Laterality: N/A;   ESOPHAGOGASTRODUODENOSCOPY (EGD) WITH PROPOFOL N/A 05/08/2020   Procedure: ESOPHAGOGASTRODUODENOSCOPY (EGD) WITH PROPOFOL;  Surgeon: Yetta Flock, MD;  Location: [facility];  Service: Gastroenterology;  Laterality: N/A;   HEMOSTASIS CLIP PLACEMENT  11/23/2021   Procedure: HEMOSTASIS CLIP PLACEMENT;  Surgeon: Mauri Pole, MD;  Location: [facility];  Service: Gastroenterology;;   HOT HEMOSTASIS N/A 08/12/2018   Procedure: HOT HEMOSTASIS (ARGON PLASMA COAGULATION/BICAP);  Surgeon: Ladene Artist, MD;  Location: [facility];  Service: Endoscopy;  Laterality: N/A;  EGD and Colon APC   HOT HEMOSTASIS N/A 05/08/2020   Procedure: HOT HEMOSTASIS (ARGON PLASMA COAGULATION/BICAP);  Surgeon: Yetta Flock, MD;  Location: [facility];  Service: Gastroenterology;  Laterality: N/A;   HOT HEMOSTASIS N/A 11/23/2021   Procedure: HOT HEMOSTASIS (ARGON PLASMA COAGULATION/BICAP);  Surgeon: Mauri Pole, MD;  Location: [facility];  Service: Gastroenterology;  Laterality: N/A;   LAPAROTOMY N/A 08/11/2020   Procedure: EXPLORATORY LAPAROTOMY PARTIAL COLECTOMY AND COLOSTOMY WITH RESECTION OF A MESSENTERIC MASS;  Surgeon: Coralie Keens, MD;  Location: [facility];  Service: General;  Laterality: N/A;   POLYPECTOMY     REMOVAL OF PLEURAL DRAINAGE CATHETER N/A 10/17/2021   Procedure: MINOR REMOVAL OF PLEURAL DRAINAGE CATHETER;  Surgeon: [name], MD;  Location: [place];  Service: Cardiopulmonary;  Laterality: N/A;    REVIEW OF SYSTEMS:  Constitutional: positive for anorexia and fatigue Eyes: negative Ears, nose, mouth, throat, and face: negative Respiratory: positive for cough, dyspnea on exertion, and pleurisy/chest pain Cardiovascular: positive for chest pain and tachypnea Gastrointestinal: negative Genitourinary:negative Integument/breast: negative Hematologic/lymphatic: negative Musculoskeletal:positive for arthralgias and muscle weakness Neurological: negative Behavioral/Psych: negative Endocrine: negative Allergic/Immunologic: negative   PHYSICAL EXAMINATION: General appearance: alert, cooperative, fatigued, and no distress Head: Normocephalic, without obvious abnormality, atraumatic Neck: no adenopathy, no JVD, supple, symmetrical, trachea midline, and thyroid not enlarged, symmetric, no tenderness/mass/nodules Lymph nodes: Cervical, supraclavicular, and axillary nodes normal. Resp: rales bilaterally Back: symmetric, no curvature. ROM normal. No CVA tenderness. Cardio: regular rate and rhythm, S1, S2 normal, no murmur, click, rub or gallop GI: soft, non-tender; bowel sounds normal; no masses,  no organomegaly Extremities: extremities normal, atraumatic, no  cyanosis or edema Neurologic: Alert and oriented X 3, normal strength and tone. Normal symmetric reflexes. Normal coordination and gait  ECOG PERFORMANCE STATUS: 1 - Symptomatic but completely ambulatory  Blood pressure (!) 122/56, pulse (!) 113, temperature 98.3 F (36.8 C), temperature source Oral, resp. rate 15, SpO2 (!) 88 %.  LABORATORY DATA: Lab Results  Component Value Date   WBC 16.3 (H) 07/03/2022   HGB 9.3 (L) 07/03/2022   HCT 29.1 (L) 07/03/2022   MCV 82.9 07/03/2022   PLT 262 07/03/2022      Chemistry      Component Value Date/Time   NA 137 05/24/2022 1322   NA 142 04/11/2016 1007   K 4.3 05/24/2022 1322   K 3.3 (L) 04/11/2016 1007   CL 100 05/24/2022 1322   CO2 32 05/24/2022 1322   CO2 31 (H) 04/11/2016 1007   BUN 45 (H) 05/24/2022 1322   BUN 24.4 04/11/2016 1007   CREATININE 1.79 (H) 05/24/2022 1322   CREATININE 1.36 (H) 02/04/2020 1443   CREATININE 1.1 04/11/2016 1007      Component Value Date/Time   CALCIUM 9.6 05/24/2022 1322  CALCIUM 9.8 04/11/2016 1007   ALKPHOS 64 05/24/2022 1322   ALKPHOS 82 04/11/2016 1007   AST 12 (L) 05/24/2022 1322   AST 12 04/11/2016 1007   ALT 5 05/24/2022 1322   ALT 12 04/11/2016 1007   BILITOT 0.3 05/24/2022 1322   BILITOT 0.35 04/11/2016 1007       RADIOGRAPHIC STUDIES: No results found.  ASSESSMENT AND PLAN: This is a very pleasant 81 years old with a stage IV (TX, N3, M1c) non-small cell lung cancer, adenocarcinoma with positive EGFR mutation exon 21 (L858R) diagnosed in October 2022 1 presented with left primary lung lesion in addition to widespread metastatic disease in the thoracic nodes, left pleural space as well as abdominal pelvic lymph node and bone metastasis. The patient is currently undergoing treatment with Tagrisso 80 mg p.o. daily started June 21, 2021.  She is status post 13 months of treatment.  The patient has been tolerating her treatment with Tagrisso fairly well with no concerning adverse  effects. She is here today for routine evaluation and blood work and she is not feeling well with shortness of breath, chest pain as well as tachycardia. Her CBC today showed elevated white blood count concerning for underlying infection likely pneumonia. I recommended for the patient to go to the emergency department immediately for further evaluation and to rule out pulmonary embolism or pneumonia. I would hold her treatment with Tagrisso for the few days of the hospitalization. For the iron deficiency anemia, the patient has history of gastrointestinal blood loss from AV malformation.  She was treated in the past with IV iron infusion.  We will continue to monitor for now and consider her for PRBCs transfusion if her hemoglobin goes down to less than 8.0. I will see the patient back for follow-up visit in 1 months for evaluation with repeat imaging studies if not done during her hospitalization. The patient is in agreement with the current plan. The scan also showed the suspicious right kidney mass concerning for renal neoplasm and she is followed by urology.  She is currently on observation by Dr. Tresa Moore She was advised to call immediately if she has any other concerning issues in the interval. The patient voices understanding of current disease status and treatment options and is in agreement with the current care plan.  All questions were answered. The patient knows to call the clinic with any problems, questions or concerns. We can certainly see the patient much sooner if necessary.   Disclaimer: This note was dictated with voice recognition software. Similar sounding words can inadvertently be transcribed and may not be corrected upon review.

## 2022-07-03 NOTE — ED Triage Notes (Addendum)
Patient sent over from cancer center. Stage 4 lung cancer. Heart rate was 140s at the cancer center. Has CHF and a fib. Complaining of shortness of breath, chills, cough, chest pain, weakness. 2L [place] baseline.

## 2022-07-03 NOTE — ED Provider Notes (Signed)
[facility] DEPT Provider Note   CSN: [id number] Arrival date & time: 07/03/22  1029     History  Chief Complaint  Patient presents with   Tachycardia    [name] is a 81 y.o. female.  Pt is a 81 yo female with pmhx significant for chf, afib, stage 4 lung cancer (adenocarcinoma).  She had Tagrisso 80 mg on 11/23.  She started feeling sob on 12/2.  She has had cough and possible fever.  Pt did go to the cancer center this morning and was sent down here.  She was 88% there, although I am not sure if that was on her normal 2L or on RA.  Pt does wear 2L oxygen all the time.        Home Medications Prior to Admission medications   Medication Sig Start Date End Date Taking? Authorizing Provider  acetaminophen (TYLENOL) 325 MG tablet Take 325-650 mg by mouth daily as needed for moderate pain, fever or headache.    Yes [provider]  albuterol (PROAIR HFA) 108 (90 Base) MCG/ACT inhaler Inhale 1-2 puffs into the lungs every 6 (six) hours as needed for wheezing or shortness of breath. Patient taking differently: Inhale 1-2 puffs into the lungs every 4 (four) hours as needed for wheezing or shortness of breath (or coughing). 10/30/16  Yes [name], MD  allopurinol (ZYLOPRIM) 100 MG tablet Take 1 tablet (100 mg total) by mouth daily. 02/06/22  Yes [name], MD  BREO ELLIPTA 100-25 MCG/ACT AEPB INHALE 1 PUFF INTO THE LUNGS DAILY 02/24/22  Yes [name], MD  clonazePAM (KLONOPIN) 1 MG tablet Take 1 tablet (1 mg total) by mouth 2 (two) times daily as needed for anxiety. Patient taking differently: Take 0.5 mg by mouth daily as needed for anxiety. 06/06/21  Yes [name], MD  Ensure (ENSURE) Take 237 mLs by mouth 2 (two) times daily between meals.   Yes [provider]  ferrous sulfate 325 (65 FE) MG tablet Take 1 tablet (325 mg total) by mouth 2 (two) times daily with a meal. 11/13/19  Yes [name], MD   isosorbide-hydrALAZINE (BIDIL) 20-37.5 MG tablet TAKE 1 TABLET BY MOUTH THREE TIMES DAILY 06/26/22  Yes [name], MD  metoprolol tartrate (LOPRESSOR) 25 MG tablet TAKE 1 TABLET(25 MG) BY MOUTH TWICE DAILY Patient taking differently: Take 25 mg by mouth in the morning and at bedtime. 05/24/22  Yes [name], MD  Nutritional Supplements (CARNATION BREAKFAST ESSENTIALS) LIQD Take 237 mLs by mouth daily.   Yes [provider]  osimertinib mesylate (TAGRISSO) 80 MG tablet Take 1 tablet (80 mg total) by mouth daily. 06/11/22  Yes Curt Bears, MD  OXYGEN Inhale 2 L/min into the lungs continuous.   Yes [provider]  prochlorperazine (COMPAZINE) 10 MG tablet Take 1 tablet (10 mg total) by mouth every 6 (six) hours as needed. Patient taking differently: Take 10 mg by mouth every 6 (six) hours as needed for nausea or vomiting. 06/08/21  Yes Heilingoetter, Cassandra L, PA-C  torsemide (DEMADEX) 20 MG tablet TAKE 2 TABLETS(40 MG) BY MOUTH DAILY Patient taking differently: Take 40 mg by mouth in the morning. 05/28/22  Yes [name], MD  cyanocobalamin 2000 MCG tablet Take 1 tablet (2,000 mcg total) by mouth daily. Patient not taking: Reported on 07/03/2022 12/06/21   [name], MD  morphine (MS CONTIN) 15 MG 12 hr tablet Take 1 tablet (  15 mg total) by mouth 2 (two) times daily as needed for pain. Patient not taking: Reported on 07/03/2022 06/15/21   [name], MD  nystatin (MYCOSTATIN/NYSTOP) powder Apply 1 application. topically 3 (three) times daily. Patient not taking: Reported on 07/03/2022 10/24/21   Barrett, Erin R, PA-C      Allergies    Celebrex [celecoxib], Amlodipine besylate, Enalapril, Lipitor [atorvastatin], Trelegy ellipta [fluticasone-umeclidin-vilant], Amoxicillin, Codeine sulfate, Hydrocodone-acetaminophen, and Tape    Review of Systems   Review of Systems  Constitutional:  Positive for chills.  Respiratory:  Positive for cough  and shortness of breath.   All other systems reviewed and are negative.   Physical Exam Updated Vital Signs BP (!) 98/54   Pulse (!) 122   Temp 98 F (36.7 C) (Oral)   Resp (!) 22   SpO2 (!) 88%  Physical Exam Vitals and nursing note reviewed.  Constitutional:      Appearance: Normal appearance. She is ill-appearing.  HENT:     Head: Normocephalic and atraumatic.     Right Ear: External ear normal.     Left Ear: External ear normal.     Nose: Nose normal.     Mouth/Throat:     Mouth: Mucous membranes are dry.  Eyes:     Extraocular Movements: Extraocular movements intact.     Conjunctiva/sclera: Conjunctivae normal.     Pupils: Pupils are equal, round, and reactive to light.  Cardiovascular:     Rate and Rhythm: Tachycardia present. Rhythm irregular.     Pulses: Normal pulses.     Heart sounds: Normal heart sounds.  Pulmonary:     Effort: Tachypnea present.  Abdominal:     General: Abdomen is flat. Bowel sounds are normal.     Palpations: Abdomen is soft.  Musculoskeletal:        General: Normal range of motion.     Cervical back: Normal range of motion and neck supple.  Skin:    General: Skin is warm.     Capillary Refill: Capillary refill takes less than 2 seconds.  Neurological:     General: No focal deficit present.     Mental Status: She is alert and oriented to person, place, and time.  Psychiatric:        Mood and Affect: Mood normal.        Behavior: Behavior normal.     ED Results / Procedures / Treatments   Labs (all labs ordered are listed, but only abnormal results are displayed) Labs Reviewed  RESP PANEL BY RT-PCR (FLU A&B, COVID) ARPGX2  CULTURE, BLOOD (ROUTINE X 2)  CULTURE, BLOOD (ROUTINE X 2)  LACTIC ACID, PLASMA  LACTIC ACID, PLASMA  BRAIN NATRIURETIC PEPTIDE    EKG None  Radiology CT Angio Chest PE W and/or Wo Contrast  Result Date: 07/03/2022 CLINICAL DATA:  Stage IV lung cancer. Tachycardia the cancer center. Shortness of  breath with chills and cough. Chest pain weakness. * Tracking Code: BO * EXAM: CT ANGIOGRAPHY CHEST WITH CONTRAST TECHNIQUE: Multidetector CT imaging of the chest was performed using the pulmonary embolus protocol during bolus administration of intravenous contrast. Multiplanar CT image reconstructions and MIPs were obtained to evaluate the vascular anatomy. RADIATION DOSE REDUCTION: This exam was performed according to the departmental dose-optimization program which includes automated exposure control, adjustment of the mA and/or kV according to patient size and/or use of iterative reconstruction technique. CONTRAST:  46m OMNIPAQUE IOHEXOL 350 MG/ML SOLN COMPARISON:  04/24/2022 FINDINGS: Cardiovascular: No filling  defect is identified in the pulmonary arterial tree to suggest pulmonary embolus. Dilute contrast medium is present in the thoracic aorta, without specific findings of acute aortic abnormality. Extensive coronary and aortic atherosclerosis and branch vessel atherosclerosis. Cardiomegaly is present. Mediastinum/Nodes: AP window lymph node 1.0 cm in short axis on image 58 series 4, previously 0.7 cm. Precarinal lymph node 1.2 cm in short axis, previously 0.8 cm. Other thoracic lymph nodes appear mildly abnormally enlarged and mildly increased from previous. Subcarinal node 2.2 cm in short axis on image 79 series 4, formerly 1.6 cm. Increased right infrahilar adenopathy. Lungs/Pleura: Small bilateral pleural effusions with loculated components on the left. Increased rind like pleural thickening the left lung apex for example on image 46 of series 4, concerning for pleural malignancy. Likewise malignant pleural effusion cannot be excluded on the right, and there is some focal nodularity along the right lateral pleural margin. Progressive atelectasis in the left lower lobe company by the pleural effusion. Bilateral airway thickening. Secondary pulmonary lobular septal thickening at the lung apices favoring  interstitial edema with patchy ground-glass densities in both lungs likewise a likely manifestation of edema. Increased atelectasis in the right lower lobe associated with the increasing right pleural effusion. Patchy confluent regions of airspace opacity in the right perihilar region, generally favoring edema. The prior faint ground-glass density nodule seen in the right upper lobe on the prior exam is completely obscured by surrounding edema and other ground-glass opacities and indistinct nodular opacities in the right lung. Upper Abdomen: Nonspecific hypodense right kidney upper pole lesions, probably cysts. The patient had a left renal mass along the lower pole on 08/14/2021, this vertical level was not included on today's chest CT. Musculoskeletal: Stable inferior endplate compression at T12. Review of the MIP images confirms the above findings. IMPRESSION: 1. No filling defect is identified in the pulmonary arterial tree to suggest pulmonary embolus. 2. Small bilateral pleural effusions with loculated components on the left. The pleural effusion is substantially increased on the right side. Increased rind like pleural thickening at the left lung apex, concerning for pleural malignancy. Likewise, there is some focal nodularity along the right lateral pleural margin which could be from pleural metastatic disease. 3. Increased mediastinal and right infrahilar adenopathy, concerning for malignancy. 4. Cardiomegaly interstitial edema with patchy ground-glass opacities in both lungs, favoring pulmonary edema. There is also some overt airspace opacity in the right lung likely from edema, less likely pneumonia. 5. The prior faint ground-glass density nodule in the right upper lobe is completely obscured by surrounding edema and other indistinct ground-glass opacities in the right lung. 6. The patient had an enhancing left renal lower pole mass on 08/14/2021, this vertical level was not included on today's chest CT. 7.  Extensive coronary and aortic atherosclerosis. 8. Stable inferior endplate compression at T12. Aortic Atherosclerosis (ICD10-I70.0). Electronically Signed   By: Van Clines M.D.   On: 07/03/2022 15:22   DG Chest Portable 1 View  Result Date: 07/03/2022 CLINICAL DATA:  81 year old female with lung cancer. Tachycardia, shortness of breath, cough, chest pain, weakness. EXAM: PORTABLE CHEST 1 VIEW COMPARISON:  Restaging CT Chest, Abdomen, and Pelvis 04/24/2022, and earlier. FINDINGS: Portable AP semi upright view at 1027 hours. Moderate left and small right pleural effusions both seem mildly increased since the CT in September. Other mediastinal contours appear stable. No pneumothorax. No air bronchograms. Chronic increased interstitial markings appear stable. Calcified aortic atherosclerosis. Visualized tracheal air column is within normal limits. Stable visualized osseous structures. Paucity  of bowel gas in the visible abdomen. IMPRESSION: 1. Chronic left > right pleural effusions seem mildly increased since September restaging CTs. 2. No other acute cardiopulmonary abnormality identified. Electronically Signed   By: Genevie Ann M.D.   On: 07/03/2022 11:08    Procedures Procedures    Medications Ordered in ED Medications  0.9 %  sodium chloride infusion ( Intravenous New Bag/Given 07/03/22 1232)  iohexol (OMNIPAQUE) 350 MG/ML injection 60 mL (60 mLs Intravenous Contrast Given 07/03/22 1500)    ED Course/ Medical Decision Making/ A&P                           Medical Decision Making Amount and/or Complexity of Data Reviewed Labs: ordered. Radiology: ordered.  Risk Prescription drug management.   This patient presents to the ED for concern of sob, this involves an extensive number of treatment options, and is a complaint that carries with it a high risk of complications and morbidity.  The differential diagnosis includes pneumonia, PE, chf, covid/flu   Co morbidities that complicate the  patient evaluation  chf, afib, stage 4 lung cancer (adenocarcinoma)   Additional history obtained:  Additional history obtained from epic chart review External records from outside source obtained and reviewed including sister   Lab Tests:  I Ordered, and personally interpreted labs.  The pertinent results include:   Cbc with wbc elevated at 16.3, hgb 9.3 (hgb 8.8 on 10/26), cmp with bun 49 and cr 1.67; covid/flu neg; lactic acid 0.9   Imaging Studies ordered:  I ordered imaging studies including cxr and ct chest I independently visualized and interpreted imaging which showed  CXR: Chronic left > right pleural effusions seem mildly increased  since September restaging CTs.  2. No other acute cardiopulmonary abnormality identified.  CT chest:  No filling defect is identified in the pulmonary arterial tree to  suggest pulmonary embolus.  2. Small bilateral pleural effusions with loculated components on  the left. The pleural effusion is substantially increased on the  right side. Increased rind like pleural thickening at the left lung  apex, concerning for pleural malignancy. Likewise, there is some  focal nodularity along the right lateral pleural margin which could  be from pleural metastatic disease.  3. Increased mediastinal and right infrahilar adenopathy, concerning  for malignancy.  4. Cardiomegaly interstitial edema with patchy ground-glass  opacities in both lungs, favoring pulmonary edema. There is also  some overt airspace opacity in the right lung likely from edema,  less likely pneumonia.  5. The prior faint ground-glass density nodule in the right upper  lobe is completely obscured by surrounding edema and other  indistinct ground-glass opacities in the right lung.  6. The patient had an enhancing left renal lower pole mass on  08/14/2021, this vertical level was not included on today's chest  CT.  7. Extensive coronary and aortic atherosclerosis.  8. Stable  inferior endplate compression at T12.    Aortic Atherosclerosis (ICD10-I70.0).   I agree with the radiologist interpretation   Cardiac Monitoring:  The patient was maintained on a cardiac monitor.  I personally viewed and interpreted the cardiac monitored which showed an underlying rhythm of: afib   Medicines ordered and prescription drug management:  I ordered medication including ivfs  for dehydration  Reevaluation of the patient after these medicines showed that the patient improved I have reviewed the patients home medicines and have made adjustments as needed   Test Considered:  ct   Critical Interventions:  ivfs     Problem List / ED Course:  SOB:  likely due to pleural effusions.  I recommended admission for possible drainage.  However, pt wants to go home.  She said she's been through this before with her left lung.  She has oxygen at home that she can use.  Her sister tried to convince her to stay.  However, she wants to go.  Pt knows that she can return at any time.  She is to f/u with pcp and with oncology.   Reevaluation:  After the interventions noted above, I reevaluated the patient and found that they have :improved   Social Determinants of Health:  Lives at home   Dispostion:  After consideration of the diagnostic results and the patients response to treatment, I feel that the patent would benefit from discharge with outpatient f/u.          Final Clinical Impression(s) / ED Diagnoses Final diagnoses:  Paroxysmal atrial fibrillation (HCC)  Pleural effusion, bilateral  Adenocarcinoma of lung, unspecified laterality [facility])    Rx / DC Orders ED Discharge Orders     None         Isla Pence, MD 07/03/22 1556

## 2022-07-03 NOTE — ED Provider Triage Note (Signed)
Emergency Medicine Provider Triage Evaluation Note  [name] , a 81 y.o. female  was evaluated in triage.  Pt complains of shortness of breath and cough.  Patient was being evaluated at the cancer center and was sent here for concerns of SOB, cough and tachycardia.  States she has had a cough at home for a few days now.  It is productive.  Unknown if she has had a fever at home.  She is normally on 2 L of oxygen at home for CHF. She has been more short of breath in the last few days. Chest pain is on the right side of her chest and non radiating.   H/o afib.  Review of Systems  Positive: See above Negative: See above  Physical Exam  BP (!) 146/61   Pulse (!) 142   Temp 98.8 F (37.1 C) (Oral)   Resp 20   SpO2 96%  Gen:   Awake, no distress   Resp:  Normal effort  MSK:   Moves extremities without difficulty  Other:    Medical Decision Making  Medically screening exam initiated at 10:51 AM.  Appropriate orders placed.  KENLYNN HOUDE was informed that the remainder of the evaluation will be completed by another provider, this initial triage assessment does not replace that evaluation, and the importance of remaining in the ED until their evaluation is complete.  Work up started   Harriet Pho, PA-C 07/03/22 1054

## 2022-07-04 ENCOUNTER — Other Ambulatory Visit: Payer: Self-pay

## 2022-07-04 ENCOUNTER — Telehealth: Payer: Self-pay | Admitting: Medical Oncology

## 2022-07-04 NOTE — Telephone Encounter (Signed)
Per ED note , Pt decline pleural drainage in ED yesterday and went home .   I returned her call from today.. She is obviously  SOB ,with pauses in between words. She said her pulse ox is in the 80's and her pulse in 130's. She requested an appt for a thoracentesis.  I strongly encouraged her to call EMS now or I will  call them for her. She said not now, " It may get a little better" . I told her she will  not get better and that her VS she reported are dangerously abnormal and indicate she is not stable  . Again I encouraged her to call l EMS now.

## 2022-07-05 ENCOUNTER — Telehealth: Payer: Self-pay

## 2022-07-05 ENCOUNTER — Inpatient Hospital Stay (HOSPITAL_COMMUNITY): Payer: Medicare Other

## 2022-07-05 ENCOUNTER — Emergency Department (HOSPITAL_COMMUNITY): Payer: Medicare Other

## 2022-07-05 ENCOUNTER — Other Ambulatory Visit: Payer: Self-pay

## 2022-07-05 ENCOUNTER — Encounter (HOSPITAL_COMMUNITY): Payer: Self-pay | Admitting: Internal Medicine

## 2022-07-05 ENCOUNTER — Inpatient Hospital Stay (HOSPITAL_COMMUNITY)
Admission: EM | Admit: 2022-07-05 | Discharge: 2022-07-30 | DRG: 291 | Disposition: E | Payer: Medicare Other | Attending: Internal Medicine | Admitting: Internal Medicine

## 2022-07-05 DIAGNOSIS — Z933 Colostomy status: Secondary | ICD-10-CM

## 2022-07-05 DIAGNOSIS — E1122 Type 2 diabetes mellitus with diabetic chronic kidney disease: Secondary | ICD-10-CM | POA: Diagnosis present

## 2022-07-05 DIAGNOSIS — I959 Hypotension, unspecified: Secondary | ICD-10-CM | POA: Diagnosis not present

## 2022-07-05 DIAGNOSIS — Z86010 Personal history of colonic polyps: Secondary | ICD-10-CM

## 2022-07-05 DIAGNOSIS — Z87891 Personal history of nicotine dependence: Secondary | ICD-10-CM

## 2022-07-05 DIAGNOSIS — Z885 Allergy status to narcotic agent status: Secondary | ICD-10-CM

## 2022-07-05 DIAGNOSIS — M8580 Other specified disorders of bone density and structure, unspecified site: Secondary | ICD-10-CM | POA: Diagnosis present

## 2022-07-05 DIAGNOSIS — C3490 Malignant neoplasm of unspecified part of unspecified bronchus or lung: Secondary | ICD-10-CM | POA: Diagnosis not present

## 2022-07-05 DIAGNOSIS — J910 Malignant pleural effusion: Secondary | ICD-10-CM | POA: Diagnosis present

## 2022-07-05 DIAGNOSIS — N1832 Chronic kidney disease, stage 3b: Secondary | ICD-10-CM | POA: Diagnosis present

## 2022-07-05 DIAGNOSIS — I4891 Unspecified atrial fibrillation: Secondary | ICD-10-CM | POA: Diagnosis present

## 2022-07-05 DIAGNOSIS — Z515 Encounter for palliative care: Secondary | ICD-10-CM

## 2022-07-05 DIAGNOSIS — Z9221 Personal history of antineoplastic chemotherapy: Secondary | ICD-10-CM

## 2022-07-05 DIAGNOSIS — Z8673 Personal history of transient ischemic attack (TIA), and cerebral infarction without residual deficits: Secondary | ICD-10-CM

## 2022-07-05 DIAGNOSIS — N179 Acute kidney failure, unspecified: Secondary | ICD-10-CM | POA: Diagnosis not present

## 2022-07-05 DIAGNOSIS — R0602 Shortness of breath: Secondary | ICD-10-CM | POA: Diagnosis not present

## 2022-07-05 DIAGNOSIS — I480 Paroxysmal atrial fibrillation: Secondary | ICD-10-CM | POA: Diagnosis present

## 2022-07-05 DIAGNOSIS — J9621 Acute and chronic respiratory failure with hypoxia: Secondary | ICD-10-CM | POA: Diagnosis present

## 2022-07-05 DIAGNOSIS — L89326 Pressure-induced deep tissue damage of left buttock: Secondary | ICD-10-CM | POA: Diagnosis present

## 2022-07-05 DIAGNOSIS — I130 Hypertensive heart and chronic kidney disease with heart failure and stage 1 through stage 4 chronic kidney disease, or unspecified chronic kidney disease: Secondary | ICD-10-CM | POA: Diagnosis not present

## 2022-07-05 DIAGNOSIS — I639 Cerebral infarction, unspecified: Secondary | ICD-10-CM | POA: Diagnosis not present

## 2022-07-05 DIAGNOSIS — R29712 NIHSS score 12: Secondary | ICD-10-CM | POA: Diagnosis present

## 2022-07-05 DIAGNOSIS — R0603 Acute respiratory distress: Secondary | ICD-10-CM | POA: Diagnosis not present

## 2022-07-05 DIAGNOSIS — J90 Pleural effusion, not elsewhere classified: Secondary | ICD-10-CM | POA: Diagnosis not present

## 2022-07-05 DIAGNOSIS — J441 Chronic obstructive pulmonary disease with (acute) exacerbation: Secondary | ICD-10-CM | POA: Diagnosis not present

## 2022-07-05 DIAGNOSIS — I5030 Unspecified diastolic (congestive) heart failure: Secondary | ICD-10-CM | POA: Diagnosis not present

## 2022-07-05 DIAGNOSIS — I10 Essential (primary) hypertension: Secondary | ICD-10-CM | POA: Diagnosis present

## 2022-07-05 DIAGNOSIS — D638 Anemia in other chronic diseases classified elsewhere: Secondary | ICD-10-CM | POA: Diagnosis not present

## 2022-07-05 DIAGNOSIS — Z888 Allergy status to other drugs, medicaments and biological substances status: Secondary | ICD-10-CM

## 2022-07-05 DIAGNOSIS — E875 Hyperkalemia: Secondary | ICD-10-CM | POA: Diagnosis not present

## 2022-07-05 DIAGNOSIS — Z79899 Other long term (current) drug therapy: Secondary | ICD-10-CM

## 2022-07-05 DIAGNOSIS — I6310 Cerebral infarction due to embolism of unspecified precerebral artery: Secondary | ICD-10-CM | POA: Diagnosis not present

## 2022-07-05 DIAGNOSIS — I509 Heart failure, unspecified: Secondary | ICD-10-CM | POA: Diagnosis not present

## 2022-07-05 DIAGNOSIS — Z66 Do not resuscitate: Secondary | ICD-10-CM | POA: Diagnosis not present

## 2022-07-05 DIAGNOSIS — G4733 Obstructive sleep apnea (adult) (pediatric): Secondary | ICD-10-CM | POA: Diagnosis present

## 2022-07-05 DIAGNOSIS — I5043 Acute on chronic combined systolic (congestive) and diastolic (congestive) heart failure: Secondary | ICD-10-CM | POA: Diagnosis not present

## 2022-07-05 DIAGNOSIS — D472 Monoclonal gammopathy: Secondary | ICD-10-CM | POA: Diagnosis present

## 2022-07-05 DIAGNOSIS — R0600 Dyspnea, unspecified: Principal | ICD-10-CM

## 2022-07-05 DIAGNOSIS — Z20822 Contact with and (suspected) exposure to covid-19: Secondary | ICD-10-CM | POA: Diagnosis not present

## 2022-07-05 DIAGNOSIS — N183 Chronic kidney disease, stage 3 unspecified: Secondary | ICD-10-CM | POA: Diagnosis not present

## 2022-07-05 DIAGNOSIS — G8194 Hemiplegia, unspecified affecting left nondominant side: Secondary | ICD-10-CM | POA: Diagnosis not present

## 2022-07-05 DIAGNOSIS — Z833 Family history of diabetes mellitus: Secondary | ICD-10-CM

## 2022-07-05 DIAGNOSIS — I5033 Acute on chronic diastolic (congestive) heart failure: Secondary | ICD-10-CM | POA: Diagnosis not present

## 2022-07-05 DIAGNOSIS — Z823 Family history of stroke: Secondary | ICD-10-CM

## 2022-07-05 DIAGNOSIS — T508X5A Adverse effect of diagnostic agents, initial encounter: Secondary | ICD-10-CM | POA: Diagnosis present

## 2022-07-05 DIAGNOSIS — I878 Other specified disorders of veins: Secondary | ICD-10-CM | POA: Diagnosis present

## 2022-07-05 DIAGNOSIS — Z91048 Other nonmedicinal substance allergy status: Secondary | ICD-10-CM

## 2022-07-05 DIAGNOSIS — J9601 Acute respiratory failure with hypoxia: Secondary | ICD-10-CM | POA: Diagnosis present

## 2022-07-05 DIAGNOSIS — I428 Other cardiomyopathies: Secondary | ICD-10-CM | POA: Diagnosis not present

## 2022-07-05 DIAGNOSIS — I5032 Chronic diastolic (congestive) heart failure: Secondary | ICD-10-CM | POA: Diagnosis present

## 2022-07-05 DIAGNOSIS — L89316 Pressure-induced deep tissue damage of right buttock: Secondary | ICD-10-CM | POA: Diagnosis not present

## 2022-07-05 DIAGNOSIS — K219 Gastro-esophageal reflux disease without esophagitis: Secondary | ICD-10-CM | POA: Diagnosis present

## 2022-07-05 DIAGNOSIS — F419 Anxiety disorder, unspecified: Secondary | ICD-10-CM | POA: Diagnosis present

## 2022-07-05 DIAGNOSIS — R29810 Facial weakness: Secondary | ICD-10-CM | POA: Diagnosis not present

## 2022-07-05 DIAGNOSIS — J449 Chronic obstructive pulmonary disease, unspecified: Secondary | ICD-10-CM | POA: Diagnosis present

## 2022-07-05 DIAGNOSIS — I5023 Acute on chronic systolic (congestive) heart failure: Secondary | ICD-10-CM | POA: Diagnosis not present

## 2022-07-05 DIAGNOSIS — C3492 Malignant neoplasm of unspecified part of left bronchus or lung: Secondary | ICD-10-CM | POA: Diagnosis present

## 2022-07-05 DIAGNOSIS — Z8249 Family history of ischemic heart disease and other diseases of the circulatory system: Secondary | ICD-10-CM

## 2022-07-05 DIAGNOSIS — Z9981 Dependence on supplemental oxygen: Secondary | ICD-10-CM

## 2022-07-05 DIAGNOSIS — Z801 Family history of malignant neoplasm of trachea, bronchus and lung: Secondary | ICD-10-CM

## 2022-07-05 DIAGNOSIS — Z880 Allergy status to penicillin: Secondary | ICD-10-CM

## 2022-07-05 DIAGNOSIS — R29818 Other symptoms and signs involving the nervous system: Secondary | ICD-10-CM | POA: Diagnosis not present

## 2022-07-05 DIAGNOSIS — J948 Other specified pleural conditions: Secondary | ICD-10-CM | POA: Diagnosis not present

## 2022-07-05 DIAGNOSIS — Z87442 Personal history of urinary calculi: Secondary | ICD-10-CM

## 2022-07-05 DIAGNOSIS — D649 Anemia, unspecified: Secondary | ICD-10-CM | POA: Diagnosis not present

## 2022-07-05 LAB — CBC WITH DIFFERENTIAL/PLATELET
Abs Immature Granulocytes: 0.1 10*3/uL — ABNORMAL HIGH (ref 0.00–0.07)
Basophils Absolute: 0.1 10*3/uL (ref 0.0–0.1)
Basophils Relative: 0 %
Eosinophils Absolute: 0.1 10*3/uL (ref 0.0–0.5)
Eosinophils Relative: 1 %
HCT: 29.6 % — ABNORMAL LOW (ref 36.0–46.0)
Hemoglobin: 9 g/dL — ABNORMAL LOW (ref 12.0–15.0)
Immature Granulocytes: 1 %
Lymphocytes Relative: 3 %
Lymphs Abs: 0.4 10*3/uL — ABNORMAL LOW (ref 0.7–4.0)
MCH: 25.9 pg — ABNORMAL LOW (ref 26.0–34.0)
MCHC: 30.4 g/dL (ref 30.0–36.0)
MCV: 85.3 fL (ref 80.0–100.0)
Monocytes Absolute: 1.3 10*3/uL — ABNORMAL HIGH (ref 0.1–1.0)
Monocytes Relative: 9 %
Neutro Abs: 12.5 10*3/uL — ABNORMAL HIGH (ref 1.7–7.7)
Neutrophils Relative %: 86 %
Platelets: 248 10*3/uL (ref 150–400)
RBC: 3.47 MIL/uL — ABNORMAL LOW (ref 3.87–5.11)
RDW: 14.5 % (ref 11.5–15.5)
WBC: 14.4 10*3/uL — ABNORMAL HIGH (ref 4.0–10.5)
nRBC: 0 % (ref 0.0–0.2)

## 2022-07-05 LAB — BODY FLUID CELL COUNT WITH DIFFERENTIAL
Eos, Fluid: 0 %
Lymphs, Fluid: 27 %
Monocyte-Macrophage-Serous Fluid: 20 % — ABNORMAL LOW (ref 50–90)
Neutrophil Count, Fluid: 53 % — ABNORMAL HIGH (ref 0–25)
Total Nucleated Cell Count, Fluid: 1806 uL — ABNORMAL HIGH (ref 0–1000)

## 2022-07-05 LAB — GLUCOSE, PLEURAL OR PERITONEAL FLUID: Glucose, Fluid: 145 mg/dL

## 2022-07-05 LAB — LACTATE DEHYDROGENASE: LDH: 90 U/L — ABNORMAL LOW (ref 98–192)

## 2022-07-05 LAB — BASIC METABOLIC PANEL
Anion gap: 8 (ref 5–15)
BUN: 56 mg/dL — ABNORMAL HIGH (ref 8–23)
CO2: 28 mmol/L (ref 22–32)
Calcium: 9.9 mg/dL (ref 8.9–10.3)
Chloride: 99 mmol/L (ref 98–111)
Creatinine, Ser: 2.14 mg/dL — ABNORMAL HIGH (ref 0.44–1.00)
GFR, Estimated: 23 mL/min — ABNORMAL LOW (ref >60)
Glucose, Bld: 166 mg/dL — ABNORMAL HIGH (ref 70–99)
Potassium: 4.8 mmol/L (ref 3.5–5.1)
Sodium: 135 mmol/L (ref 135–145)

## 2022-07-05 LAB — PROCALCITONIN: Procalcitonin: 0.29 ng/mL

## 2022-07-05 LAB — LACTATE DEHYDROGENASE, PLEURAL OR PERITONEAL FLUID: LD, Fluid: 58 U/L — ABNORMAL HIGH (ref 3–23)

## 2022-07-05 LAB — PROTEIN, PLEURAL OR PERITONEAL FLUID: Total protein, fluid: 4 g/dL

## 2022-07-05 LAB — STREP PNEUMONIAE URINARY ANTIGEN: Strep Pneumo Urinary Antigen: NEGATIVE

## 2022-07-05 LAB — BRAIN NATRIURETIC PEPTIDE: B Natriuretic Peptide: 307.6 pg/mL — ABNORMAL HIGH (ref 0.0–100.0)

## 2022-07-05 LAB — PROTEIN, TOTAL: Total Protein: 7.8 g/dL (ref 6.5–8.1)

## 2022-07-05 MED ORDER — FERROUS SULFATE 325 (65 FE) MG PO TABS
325.0000 mg | ORAL_TABLET | Freq: Two times a day (BID) | ORAL | Status: DC
  Administered 2022-07-06 – 2022-07-07 (×4): 325 mg via ORAL
  Filled 2022-07-05 (×4): qty 1

## 2022-07-05 MED ORDER — HEPARIN SODIUM (PORCINE) 5000 UNIT/ML IJ SOLN
5000.0000 [IU] | Freq: Three times a day (TID) | INTRAMUSCULAR | Status: DC
  Administered 2022-07-05 – 2022-07-07 (×8): 5000 [IU] via SUBCUTANEOUS
  Filled 2022-07-05 (×8): qty 1

## 2022-07-05 MED ORDER — DILTIAZEM LOAD VIA INFUSION
20.0000 mg | Freq: Once | INTRAVENOUS | Status: AC
  Administered 2022-07-05: 20 mg via INTRAVENOUS
  Filled 2022-07-05: qty 20

## 2022-07-05 MED ORDER — ISOSORB DINITRATE-HYDRALAZINE 20-37.5 MG PO TABS
1.0000 | ORAL_TABLET | Freq: Three times a day (TID) | ORAL | Status: DC
  Administered 2022-07-05: 1 via ORAL
  Filled 2022-07-05 (×3): qty 1

## 2022-07-05 MED ORDER — TRAZODONE HCL 50 MG PO TABS: 50.0000 mg | ORAL_TABLET | Freq: Every evening | ORAL | Status: DC | PRN

## 2022-07-05 MED ORDER — METOPROLOL TARTRATE 5 MG/5ML IV SOLN: 5.0000 mg | INTRAVENOUS | Status: DC | PRN

## 2022-07-05 MED ORDER — SODIUM CHLORIDE 0.9 % IV SOLN
500.0000 mg | INTRAVENOUS | Status: DC
  Administered 2022-07-05: 500 mg via INTRAVENOUS
  Filled 2022-07-05: qty 5

## 2022-07-05 MED ORDER — OXYCODONE HCL 5 MG PO TABS
5.0000 mg | ORAL_TABLET | ORAL | Status: DC | PRN
  Administered 2022-07-05 – 2022-07-07 (×4): 5 mg via ORAL
  Filled 2022-07-05 (×4): qty 1

## 2022-07-05 MED ORDER — NYSTATIN 100000 UNIT/GM EX POWD
Freq: Two times a day (BID) | CUTANEOUS | Status: DC
  Filled 2022-07-05: qty 15

## 2022-07-05 MED ORDER — DILTIAZEM HCL-DEXTROSE 125-5 MG/125ML-% IV SOLN (PREMIX)
5.0000 mg/h | INTRAVENOUS | Status: DC
  Administered 2022-07-05: 5 mg/h via INTRAVENOUS
  Administered 2022-07-05: 12.5 mg/h via INTRAVENOUS
  Filled 2022-07-05 (×2): qty 125

## 2022-07-05 MED ORDER — CLONAZEPAM 0.5 MG PO TABS
0.5000 mg | ORAL_TABLET | Freq: Every day | ORAL | Status: DC | PRN
  Administered 2022-07-07: 0.5 mg via ORAL
  Filled 2022-07-05: qty 1

## 2022-07-05 MED ORDER — SENNOSIDES-DOCUSATE SODIUM 8.6-50 MG PO TABS: 1.0000 | ORAL_TABLET | Freq: Every evening | ORAL | Status: DC | PRN

## 2022-07-05 MED ORDER — IPRATROPIUM-ALBUTEROL 0.5-2.5 (3) MG/3ML IN SOLN: 3.0000 mL | RESPIRATORY_TRACT | Status: DC | PRN

## 2022-07-05 MED ORDER — ONDANSETRON HCL 4 MG/2ML IJ SOLN: 4.0000 mg | Freq: Four times a day (QID) | INTRAMUSCULAR | Status: DC | PRN

## 2022-07-05 MED ORDER — ACETAMINOPHEN 325 MG PO TABS: 650.0000 mg | ORAL_TABLET | Freq: Four times a day (QID) | ORAL | Status: DC | PRN

## 2022-07-05 MED ORDER — SODIUM CHLORIDE 0.9 % IV SOLN
2.0000 g | INTRAVENOUS | Status: DC
  Administered 2022-07-05 – 2022-07-07 (×3): 2 g via INTRAVENOUS
  Filled 2022-07-05 (×3): qty 20

## 2022-07-05 MED ORDER — METOPROLOL TARTRATE 25 MG PO TABS
25.0000 mg | ORAL_TABLET | Freq: Two times a day (BID) | ORAL | Status: DC
  Administered 2022-07-05: 25 mg via ORAL
  Filled 2022-07-05: qty 1

## 2022-07-05 MED ORDER — FUROSEMIDE 10 MG/ML IJ SOLN
40.0000 mg | Freq: Two times a day (BID) | INTRAMUSCULAR | Status: DC
  Administered 2022-07-05: 40 mg via INTRAVENOUS
  Filled 2022-07-05: qty 4

## 2022-07-05 MED ORDER — SODIUM CHLORIDE 0.9 % IV SOLN: INTRAVENOUS | Status: DC

## 2022-07-05 MED ORDER — GUAIFENESIN 100 MG/5ML PO LIQD
5.0000 mL | ORAL | Status: DC | PRN
  Administered 2022-07-06 – 2022-07-07 (×2): 5 mL via ORAL
  Filled 2022-07-05 (×2): qty 10

## 2022-07-05 MED ORDER — OSIMERTINIB MESYLATE 80 MG PO TABS: 80.0000 mg | ORAL_TABLET | Freq: Every day | ORAL | Status: DC

## 2022-07-05 NOTE — ED Notes (Signed)
ED TO INPATIENT HANDOFF REPORT  ED Nurse Name and Phone #: Evangelynn Lochridge RN   S Name/Age/Gender [name] 81 y.o. female Room/Bed: WA04/WA04  Code Status   Code Status: Partial Code  Home/SNF/Other Home Patient oriented to: self, place, time, and situation Is this baseline? Yes   Triage Complete: Triage complete  Chief Complaint Atrial fibrillation with RVR ([place]) [I48.91]  Triage Note Pt sent to ER for left lung thoracentesis and spo2 77% on RA.  2lpm [place] baseline at home but didn't have portable oxygen.  Pt plced on 6lpm [place] in triage and at 95% Hx of chf, afib, stage 4 lung cancer   Allergies Allergies  Allergen Reactions   Celebrex [Celecoxib] Swelling and Other (See Comments)    Ankles swell   Amlodipine Besylate Swelling and Other (See Comments)    Ankles swell   Enalapril Cough   Lipitor [Atorvastatin] Other (See Comments)    Muscle aches   Trelegy Ellipta [Fluticasone-Umeclidin-Vilant] Other (See Comments)    Made the throat and esophagus "hurt" Uses Breo (fluticasone - vilanterol) on a regular basis   Amoxicillin Diarrhea    DID THE REACTION INVOLVE: Swelling of the face/tongue/throat, SOB, or low BP? N Sudden or severe rash/hives, skin peeling, or the inside of the mouth or nose? N Did it require medical treatment? N When did it last happen?      MORE THAN 10 YEARS If all above answers are "NO", may proceed with cephalosporin use.    Codeine Sulfate Nausea Only   Hydrocodone-Acetaminophen Nausea Only   Tape Itching, Rash and Other (See Comments)    Redness, also    Level of Care/Admitting Diagnosis ED Disposition     ED Disposition  Admit   Condition  --   [place]: [place] [[id number]]  Level of Care: Stepdown [14]  Admit to SDU based on following criteria: Cardiac Instability:  Patients experiencing chest pain, unconfirmed MI and stable, arrhythmias and CHF requiring medical management and potentially  compromising patient's stability  May admit patient to [facility] or [facility] if equivalent level of care is available:: Yes  Covid Evaluation: Asymptomatic - no recent exposure (last 10 days) testing not required  Diagnosis: Atrial fibrillation with RVR [facility]) [[id number]]  Admitting Physician: Gerlean Ren [facility] [[id number]]  Attending Physician: Gerlean Ren [facility] [[id number]]  Certification:: I certify this patient will need inpatient services for at least 2 midnights  Estimated Length of Stay: 3          B Medical/Surgery History Past Medical History:  Diagnosis Date   Allergy    Angiodysplasia of cecum    Angiodysplasia of duodenum    Arthritis    Asteatotic eczema 02/25/2016   Asthma    Atrial fibrillation ([place]) 10/17/2017   Blood transfusion without reported diagnosis    Cataract    CHF (congestive heart failure), NYHA class III, chronic, combined ([place]) 08/10/2017   Colonic polyp 02/16/2008   Tubular adenoma   COPD (chronic obstructive pulmonary disease) ([place]) 01/27/1998   Cor pulmonale ([place]) 11/16/2014   GERD 07/30/1992   Gout    HTN (hypertension) 07/30/1988   Hyperlipidemia with target LDL less than 100 11/27/1996        Kidney stone 1960, 1972, 1991   lung ca 04/2021   Medical history non-contributory    MGUS (monoclonal gammopathy of unknown significance) 11/08/2016   Morbid obesity ([place]) 04/19/2010   She agrees to work on her lifestyle modifications to help  her lose weight.    OSA (obstructive sleep apnea) 06/22/2013   Osteopenia, senile 02/05/2013   July 2014  -2.1 left femur -1.9 left forearm    Prediabetes 11/13/2006   Renal insufficiency    Stenosis of cervical spine with myelopathy ([place]) 01/01/2018   Supplemental oxygen dependent    2L via    Ulcer of leg, chronic, right ([place]) 10/10/2011   ULCERATIVE COLITIS-LEFT SIDE 03/19/2008        Vitamin B12 deficiency anemia 11/13/2006        Vitamin D deficiency 02/06/2013   Past Surgical History:   Procedure Laterality Date   BRONCHOSCOPY     CHEST TUBE INSERTION Left 06/15/2021   Procedure: INSERTION PLEURAL DRAINAGE CATHETER;  Surgeon: [name], MD;  Location: [place];  Service: Thoracic;  Laterality: Left;   COLONOSCOPY     COLONOSCOPY WITH PROPOFOL N/A 02/23/2015   Procedure: COLONOSCOPY WITH PROPOFOL;  Surgeon: Ladene Artist, MD;  [facility];  Service: Endoscopy;  Laterality: N/A;   COLONOSCOPY WITH PROPOFOL N/A 08/12/2018   Procedure: COLONOSCOPY WITH PROPOFOL;  Surgeon: Ladene Artist, MD;  [facility];  Service: Endoscopy;  Laterality: N/A;   COLONOSCOPY WITH PROPOFOL N/A 05/09/2020   Procedure: COLONOSCOPY WITH PROPOFOL;  Surgeon: Ladene Artist, MD;  [facility];  Service: Endoscopy;  Laterality: N/A;  To Splenic Flexture   COLONOSCOPY WITH PROPOFOL N/A 11/23/2021   Procedure: COLONOSCOPY WITH PROPOFOL;  Surgeon: Mauri Pole, MD;  [facility];  Service: Gastroenterology;  Laterality: N/A;   ESOPHAGOGASTRODUODENOSCOPY N/A 11/23/2021   Procedure: ESOPHAGOGASTRODUODENOSCOPY (EGD);  Surgeon: Mauri Pole, MD;  [facility];  Service: Gastroenterology;  Laterality: N/A;   ESOPHAGOGASTRODUODENOSCOPY (EGD) WITH PROPOFOL N/A 02/23/2015   Procedure: ESOPHAGOGASTRODUODENOSCOPY (EGD) WITH PROPOFOL;  Surgeon: Ladene Artist, MD;  [facility];  Service: Endoscopy;  Laterality: N/A;   ESOPHAGOGASTRODUODENOSCOPY (EGD) WITH PROPOFOL N/A 08/12/2018   Procedure: ESOPHAGOGASTRODUODENOSCOPY (EGD) WITH PROPOFOL;  Surgeon: Ladene Artist, MD;  [facility];  Service: Endoscopy;  Laterality: N/A;   ESOPHAGOGASTRODUODENOSCOPY (EGD) WITH PROPOFOL N/A 05/08/2020   Procedure: ESOPHAGOGASTRODUODENOSCOPY (EGD) WITH PROPOFOL;  Surgeon: Yetta Flock, MD;  [facility];  Service: Gastroenterology;  Laterality: N/A;   HEMOSTASIS CLIP PLACEMENT  11/23/2021   Procedure: HEMOSTASIS CLIP  PLACEMENT;  Surgeon: Mauri Pole, MD;  [facility];  Service: Gastroenterology;;   HOT HEMOSTASIS N/A 08/12/2018   Procedure: HOT HEMOSTASIS (ARGON PLASMA COAGULATION/BICAP);  Surgeon: Ladene Artist, MD;  [facility];  Service: Endoscopy;  Laterality: N/A;  EGD and Colon APC   HOT HEMOSTASIS N/A 05/08/2020   Procedure: HOT HEMOSTASIS (ARGON PLASMA COAGULATION/BICAP);  Surgeon: Yetta Flock, MD;  [facility];  Service: Gastroenterology;  Laterality: N/A;   HOT HEMOSTASIS N/A 11/23/2021   Procedure: HOT HEMOSTASIS (ARGON PLASMA COAGULATION/BICAP);  Surgeon: Mauri Pole, MD;  [facility];  Service: Gastroenterology;  Laterality: N/A;   LAPAROTOMY N/A 08/11/2020   Procedure: EXPLORATORY LAPAROTOMY PARTIAL COLECTOMY AND COLOSTOMY WITH RESECTION OF A MESSENTERIC MASS;  Surgeon: Coralie Keens, MD;  [facility];  Service: General;  Laterality: N/A;   POLYPECTOMY     REMOVAL OF PLEURAL DRAINAGE CATHETER N/A 10/17/2021   Procedure: MINOR REMOVAL OF PLEURAL DRAINAGE CATHETER;  Surgeon: [name], MD;  Location: [place];  Service: Cardiopulmonary;  Laterality: N/A;     A IV Location/Drains/Wounds Patient Lines/Drains/Airways Status     Active Line/Drains/Airways  Name Placement date Placement time Site Days   Peripheral IV 07/21/2022 20 G Left Antecubital 07/25/2022  1309  Antecubital  less than 1   Colostomy LUQ 08/11/20  1344  LUQ  693   External Urinary Catheter 11/22/21  1750  --  225   Pressure Injury 10/09/21 Buttocks Left;Medial Stage 2 -  Partial thickness loss of dermis presenting as a shallow open injury with a red, pink wound bed without slough. 2 small wounds noted on Lt inner buttock, also very redden 10/09/21  1700  -- 269   Wound / Incision (Open or Dehisced) 11/22/21 (MASD) Moisture Associated Skin Damage Pelvis Anterior 11/22/21  1200  Pelvis  225   Wound / Incision (Open or Dehisced) 11/22/21  (MASD) Moisture Associated Skin Damage Breast Lower;Bilateral 11/22/21  1200  Breast  225   Wound / Incision (Open or Dehisced) 11/22/21 (MARSI) Medical Adhesive-Related Skin Injury;Skin tear Arm Anterior;Left;Lower 2 inches, approximated with steri strips, covered in TAO, gauze and tegaderm 11/22/21  2230  Arm  225            Intake/Output Last 24 hours No intake or output data in the 24 hours ending 06/29/2022 1620  Labs/Imaging Results for orders placed or performed during the hospital encounter of 07/19/2022 (from the past 48 hour(s))  CBC with Differential/Platelet     Status: Abnormal   Collection Time: 07/28/2022 12:30 PM  Result Value Ref Range   WBC 14.4 (H) 4.0 - 10.5 K/uL   RBC 3.47 (L) 3.87 - 5.11 MIL/uL   Hemoglobin 9.0 (L) 12.0 - 15.0 g/dL   HCT 29.6 (L) 36.0 - 46.0 %   MCV 85.3 80.0 - 100.0 fL   MCH 25.9 (L) 26.0 - 34.0 pg   MCHC 30.4 30.0 - 36.0 g/dL   RDW 14.5 11.5 - 15.5 %   Platelets 248 150 - 400 K/uL   nRBC 0.0 0.0 - 0.2 %   Neutrophils Relative % 86 %   Neutro Abs 12.5 (H) 1.7 - 7.7 K/uL   Lymphocytes Relative 3 %   Lymphs Abs 0.4 (L) 0.7 - 4.0 K/uL   Monocytes Relative 9 %   Monocytes Absolute 1.3 (H) 0.1 - 1.0 K/uL   Eosinophils Relative 1 %   Eosinophils Absolute 0.1 0.0 - 0.5 K/uL   Basophils Relative 0 %   Basophils Absolute 0.1 0.0 - 0.1 K/uL   Immature Granulocytes 1 %   Abs Immature Granulocytes 0.10 (H) 0.00 - 0.07 K/uL    Comment: Performed at [facility], [place] [address] [place], [place] [address]  Brain natriuretic peptide     Status: Abnormal   Collection Time: 07/15/2022 12:30 PM  Result Value Ref Range   B Natriuretic Peptide 307.6 (H) 0.0 - 100.0 pg/mL    Comment: Performed at [facility], [place] [address], [place], [place] [address]  Basic metabolic panel     Status: Abnormal   Collection Time: 07/23/2022 12:30 PM  Result Value Ref Range   Sodium 135 135 - 145 mmol/L   Potassium 4.8 3.5 - 5.1 mmol/L     Comment: HEMOLYSIS AT THIS LEVEL MAY AFFECT RESULT   Chloride 99 98 - 111 mmol/L   CO2 28 22 - 32 mmol/L   Glucose, Bld 166 (H) 70 - 99 mg/dL    Comment: Glucose reference range applies only to samples taken after fasting for at least 8 hours.   BUN 56 (H) 8 - 23 mg/dL  Creatinine, Ser 2.14 (H) 0.44 - 1.00 mg/dL   Calcium 9.9 8.9 - 10.3 mg/dL   GFR, Estimated 23 (L) >60 mL/min    Comment: (NOTE) Calculated using the CKD-EPI Creatinine Equation (2021)    Anion gap 8 5 - 15    Comment: Performed at [facility], [place] [address], [name], [place] [address]   DG Chest Port 1 View  Result Date: 07/03/2022 CLINICAL DATA:  Shortness of breath EXAM: PORTABLE CHEST 1 VIEW COMPARISON:  Previous studies including the examination done on 07/03/2022 FINDINGS: Transverse diameter of heart is increased. Central pulmonary vessels are prominent. Increased interstitial and alveolar markings are seen in parahilar regions and lower lung fields. Left hemidiaphragm is elevated. Small to moderate bilateral pleural effusions are seen, more so on the left side. There is interval increase in amount of left pleural effusion. There is no pneumothorax. IMPRESSION: Cardiomegaly. CHF. Small to moderate bilateral pleural effusions, more so on the left side with interval increase. Electronically Signed   By: Elmer Picker M.D.   On: 07/23/2022 13:18    Pending Labs Unresulted Labs (From admission, onward)     Start     Ordered   07/06/22 3235  Basic metabolic panel  Daily,   R      07/15/2022 1421   07/06/22 0500  CBC  Daily,   R      07/25/2022 1421   07/06/22 0500  Magnesium  Daily,   R      07/18/2022 1421   07/25/2022 1613  Lactate dehydrogenase  (Bedside Thoracentesis)  Once,   R        07/25/2022 1612   07/21/2022 1613  Protein, total  (Bedside Thoracentesis)  Once,   R        06/30/2022 1612   06/30/2022 1612  Pleural fluid culture w Gram Stain  (Bedside Thoracentesis)  Once,   R       Question:   Are there also cytology or pathology orders on this specimen?  Answer:  Yes   07/18/2022 1612   07/01/2022 1612  Glucose, pleural fluid  (Bedside Thoracentesis)  Once,   R        07/29/2022 1612   07/02/2022 1612  Protein, pleural fluid  (Bedside Thoracentesis)  Once,   R        07/01/2022 1612   07/07/2022 1612  Pleural fluid cell count with differential  (Bedside Thoracentesis)  Once,   R       Question:  Are there also cytology or pathology orders on this specimen?  Answer:  Yes   07/24/2022 1612   07/17/2022 1612  Lactate dehydrogenase, pleural fluid  (Bedside Thoracentesis)  Once,   R        07/18/2022 1612            Vitals/Pain Today's Vitals   07/04/2022 1445 07/04/2022 1500 07/20/2022 1515 07/21/2022 1530  BP:  131/77  (!) 134/103  Pulse: (!) 115 (!) 29 (!) 168 (!) 101  Resp: (!) 28 (!) 26 (!) 22 (!) 29  Temp:      TempSrc:      SpO2: 94% 92% 96% 92%    Isolation Precautions No active isolations  Medications Medications  0.9 %  sodium chloride infusion ( Intravenous New Bag/Given 07/07/2022 1439)  diltiazem (CARDIZEM) 1 mg/mL load via infusion 20 mg (20 mg Intravenous Bolus from Bag 06/30/2022 1312)    And  diltiazem (CARDIZEM) 125 mg in dextrose 5% 125 mL (  1 mg/mL) infusion (5 mg/hr Intravenous New Bag/Given 07/04/2022 1313)  ipratropium-albuterol (DUONEB) 0.5-2.5 (3) MG/3ML nebulizer solution 3 mL (has no administration in time range)  metoprolol tartrate (LOPRESSOR) injection 5 mg (has no administration in time range)  senna-docusate (Senokot-S) tablet 1 tablet (has no administration in time range)  guaiFENesin (ROBITUSSIN) 100 MG/5ML liquid 5 mL (has no administration in time range)  traZODone (DESYREL) tablet 50 mg (has no administration in time range)  acetaminophen (TYLENOL) tablet 650 mg (has no administration in time range)  ondansetron (ZOFRAN) injection 4 mg (has no administration in time range)  oxyCODONE (Oxy IR/ROXICODONE) immediate release tablet 5 mg (has no administration in time  range)  furosemide (LASIX) injection 40 mg (40 mg Intravenous Given 07/27/2022 1511)  heparin injection 5,000 Units (5,000 Units Subcutaneous Given 06/29/2022 1510)  isosorbide-hydrALAZINE (BIDIL) 20-37.5 MG per tablet 1 tablet (has no administration in time range)  metoprolol tartrate (LOPRESSOR) tablet 25 mg (has no administration in time range)  ferrous sulfate tablet 325 mg (has no administration in time range)  clonazePAM (KLONOPIN) tablet 0.5 mg (has no administration in time range)    Mobility walks with device     Focused Assessments    R Recommendations: See Admitting Provider Note  Report given to:   Additional Notes:

## 2022-07-05 NOTE — ED Triage Notes (Addendum)
Pt sent to ER for left lung thoracentesis and spo2 77% on RA.  2lpm [place] baseline at home but didn't have portable oxygen.  Pt plced on 6lpm [place] in triage and at 95% Hx of chf, afib, stage 4 lung cancer

## 2022-07-05 NOTE — H&P (Signed)
History and Physical    KATHALEYA MCDUFFEE PJK:[id number] DOB: [birth year] DOA: 07/13/2022  PCP: [name], MD Patient coming from: Home Chief Complaint: Shortness of breath  HPI: [name] is a 81 y.o. female with medical history significant of stage IV adenocarcinoma of lung followed by Dr. Earlie Server on [place] N[place], HTN, GERD, COPD, diastolic CHF, MGUS, osteopenia, chronic hypoxia on 2 L nasal cannula comes to the hospital for shortness of breath.  Patient started developing shortness of breath and some palpitation about 5 days ago and came to the ED bed at that time due to long wait time she ended up going back home but her symptoms persisted.  Denies any fevers chills, lightheadedness, dizziness, chest pain.  She admits of medication compliance. Last time she saw cardiologist was more than a year ago.  At that time there was concerns of possible atrial fibrillation but patient did not follow-up much.  She has not been on anticoagulation due to previous history of GI bleeds.  In the ED patient was noted to be in atrial fibrillation with RVR, acute kidney injury with creatinine of 2.14, baseline 1.6.  Her BNP was noted to be elevated, chest x-ray suggested left-sided moderate pleural effusion which has not changed much since her previous CT scans.  She was started on Cardizem drip and medical team was requested to admit the patient.  When I saw the patient granddaughter was present at bedside  Status-patient wishes to be partial code-no intubation Social history-denies any tobacco, alcohol and illicit drug use    Review of Systems: As per HPI otherwise 10 point review of systems negative.  Review of Systems Otherwise negative except as per HPI, including: General: Denies fever, chills, night sweats or unintended weight loss. Resp: Denies hemoptysis Cardiac: Denies chest pain, palpitations, orthopnea, paroxysmal nocturnal dyspnea. GI: Denies abdominal pain, nausea, vomiting, diarrhea  or constipation GU: Denies dysuria, frequency, hesitancy or incontinence MS: Denies muscle aches, joint pain or swelling Neuro: Denies headache, neurologic deficits (focal weakness, numbness, tingling), abnormal gait Psych: Denies anxiety, depression, SI/HI/AVH Skin: Denies new rashes or lesions ID: Denies sick contacts, exotic exposures, travel  Past Medical History:  Diagnosis Date   Allergy    Angiodysplasia of cecum    Angiodysplasia of duodenum    Arthritis    Asteatotic eczema 02/25/2016   Asthma    Atrial fibrillation ([place]) 10/17/2017   Blood transfusion without reported diagnosis    Cataract    CHF (congestive heart failure), NYHA class III, chronic, combined ([place]) 08/10/2017   Colonic polyp 02/16/2008   Tubular adenoma   COPD (chronic obstructive pulmonary disease) ([place]) 01/27/1998   Cor pulmonale ([place]) 11/16/2014   GERD 07/30/1992   Gout    HTN (hypertension) 07/30/1988   Hyperlipidemia with target LDL less than 100 11/27/1996        Kidney stone 1960, 1972, 1991   lung ca 04/2021   Medical history non-contributory    MGUS (monoclonal gammopathy of unknown significance) 11/08/2016   Morbid obesity ([place]) 04/19/2010   She agrees to work on her lifestyle modifications to help her lose weight.    OSA (obstructive sleep apnea) 06/22/2013   Osteopenia, senile 02/05/2013   July 2014  -2.1 left femur -1.9 left forearm    Prediabetes 11/13/2006   Renal insufficiency    Stenosis of cervical spine with myelopathy ([place]) 01/01/2018   Supplemental oxygen dependent    2L via [place]   Ulcer of leg, chronic, right ([place]) 10/10/2011  ULCERATIVE COLITIS-LEFT SIDE 03/19/2008        Vitamin B12 deficiency anemia 11/13/2006        Vitamin D deficiency 02/06/2013    Past Surgical History:  Procedure Laterality Date   BRONCHOSCOPY     CHEST TUBE INSERTION Left 06/15/2021   Procedure: INSERTION PLEURAL DRAINAGE CATHETER;  Surgeon: [name], MD;  Location: [facility];   Service: Thoracic;  Laterality: Left;   COLONOSCOPY     COLONOSCOPY WITH PROPOFOL N/A 02/23/2015   Procedure: COLONOSCOPY WITH PROPOFOL;  Surgeon: Ladene Artist, MD;  [facility];  Service: Endoscopy;  Laterality: N/A;   COLONOSCOPY WITH PROPOFOL N/A 08/12/2018   Procedure: COLONOSCOPY WITH PROPOFOL;  Surgeon: Ladene Artist, MD;  [facility];  Service: Endoscopy;  Laterality: N/A;   COLONOSCOPY WITH PROPOFOL N/A 05/09/2020   Procedure: COLONOSCOPY WITH PROPOFOL;  Surgeon: Ladene Artist, MD;  [facility];  Service: Endoscopy;  Laterality: N/A;  To Splenic Flexture   COLONOSCOPY WITH PROPOFOL N/A 11/23/2021   Procedure: COLONOSCOPY WITH PROPOFOL;  Surgeon: Mauri Pole, MD;  [facility];  Service: Gastroenterology;  Laterality: N/A;   ESOPHAGOGASTRODUODENOSCOPY N/A 11/23/2021   Procedure: ESOPHAGOGASTRODUODENOSCOPY (EGD);  Surgeon: Mauri Pole, MD;  [facility]: [facility];  Service: Gastroenterology;  Laterality: N/A;   ESOPHAGOGASTRODUODENOSCOPY (EGD) WITH PROPOFOL N/A 02/23/2015   Procedure: ESOPHAGOGASTRODUODENOSCOPY (EGD) WITH PROPOFOL;  Surgeon: Ladene Artist, MD;  [facility];  Service: Endoscopy;  Laterality: N/A;   ESOPHAGOGASTRODUODENOSCOPY (EGD) WITH PROPOFOL N/A 08/12/2018   Procedure: ESOPHAGOGASTRODUODENOSCOPY (EGD) WITH PROPOFOL;  Surgeon: Ladene Artist, MD;  [facility];  Service: Endoscopy;  Laterality: N/A;   ESOPHAGOGASTRODUODENOSCOPY (EGD) WITH PROPOFOL N/A 05/08/2020   Procedure: ESOPHAGOGASTRODUODENOSCOPY (EGD) WITH PROPOFOL;  Surgeon: Yetta Flock, MD;  [facility];  Service: Gastroenterology;  Laterality: N/A;   HEMOSTASIS CLIP PLACEMENT  11/23/2021   Procedure: HEMOSTASIS CLIP PLACEMENT;  Surgeon: Mauri Pole, MD;  [facility];  Service: Gastroenterology;;   HOT HEMOSTASIS N/A 08/12/2018   Procedure: HOT HEMOSTASIS (ARGON PLASMA COAGULATION/BICAP);   Surgeon: Ladene Artist, MD;  Location: [facility];  Service: Endoscopy;  Laterality: N/A;  EGD and Colon APC   HOT HEMOSTASIS N/A 05/08/2020   Procedure: HOT HEMOSTASIS (ARGON PLASMA COAGULATION/BICAP);  Surgeon: Yetta Flock, MD;  Location: [facility];  Service: Gastroenterology;  Laterality: N/A;   HOT HEMOSTASIS N/A 11/23/2021   Procedure: HOT HEMOSTASIS (ARGON PLASMA COAGULATION/BICAP);  Surgeon: Mauri Pole, MD;  Location: [facility];  Service: Gastroenterology;  Laterality: N/A;   LAPAROTOMY N/A 08/11/2020   Procedure: EXPLORATORY LAPAROTOMY PARTIAL COLECTOMY AND COLOSTOMY WITH RESECTION OF A MESSENTERIC MASS;  Surgeon: Coralie Keens, MD;  [facility];  Service: General;  Laterality: N/A;   POLYPECTOMY     REMOVAL OF PLEURAL DRAINAGE CATHETER N/A 10/17/2021   Procedure: MINOR REMOVAL OF PLEURAL DRAINAGE CATHETER;  Surgeon: [name], MD;  Location: [facility];  Service: Cardiopulmonary;  Laterality: N/A;    SOCIAL HISTORY:  reports that she quit smoking about 26 years ago. Her smoking use included cigarettes. She has never used smokeless tobacco. She reports that she does not drink alcohol and does not use drugs.  Allergies  Allergen Reactions   Celebrex [Celecoxib] Swelling and Other (See Comments)    Ankles swell   Amlodipine Besylate Swelling and Other (See Comments)    Ankles swell   Enalapril Cough   Lipitor [Atorvastatin] Other (See Comments)    Muscle  aches   Trelegy Ellipta [Fluticasone-Umeclidin-Vilant] Other (See Comments)    Made the throat and esophagus "hurt" Uses Breo (fluticasone - vilanterol) on a regular basis   Amoxicillin Diarrhea    DID THE REACTION INVOLVE: Swelling of the face/tongue/throat, SOB, or low BP? N Sudden or severe rash/hives, skin peeling, or the inside of the mouth or nose? N Did it require medical treatment? N When did it last happen?      MORE THAN 10 YEARS If all above answers are "NO", may  proceed with cephalosporin use.    Codeine Sulfate Nausea Only   Hydrocodone-Acetaminophen Nausea Only   Tape Itching, Rash and Other (See Comments)    Redness, also    FAMILY HISTORY: Family History  Problem Relation Age of Onset   Stroke Mother    Diabetes Mother    Kidney cancer Mother    Stomach cancer Mother    Heart disease Mother    Stroke Father    Heart disease Brother    Heart disease Brother    Crohn's disease Brother    Heart disease Sister        CATH,STENT   Clotting disorder Sister    Cervical cancer Sister    Lung cancer Sister    Heart attack Sister    Diabetes Sister    Colon cancer Neg Hx    Esophageal cancer Neg Hx    Rectal cancer Neg Hx      Prior to Admission medications   Medication Sig Start Date End Date Taking? Authorizing Provider  acetaminophen (TYLENOL) 325 MG tablet Take 325-650 mg by mouth daily as needed for moderate pain, fever or headache.     [provider]  albuterol (PROAIR HFA) 108 (90 Base) MCG/ACT inhaler Inhale 1-2 puffs into the lungs every 6 (six) hours as needed for wheezing or shortness of breath. Patient taking differently: Inhale 1-2 puffs into the lungs every 4 (four) hours as needed for wheezing or shortness of breath (or coughing). 10/30/16   [name], MD  allopurinol (ZYLOPRIM) 100 MG tablet Take 1 tablet (100 mg total) by mouth daily. 02/06/22   [name], MD  BREO ELLIPTA 100-25 MCG/ACT AEPB INHALE 1 PUFF INTO THE LUNGS DAILY 02/24/22   [name], MD  clonazePAM (KLONOPIN) 1 MG tablet Take 1 tablet (1 mg total) by mouth 2 (two) times daily as needed for anxiety. Patient taking differently: Take 0.5 mg by mouth daily as needed for anxiety. 06/06/21   [name], MD  cyanocobalamin 2000 MCG tablet Take 1 tablet (2,000 mcg total) by mouth daily. Patient not taking: Reported on 07/03/2022 12/06/21   [name], MD  Ensure (ENSURE) Take 237 mLs by mouth 2 (two) times daily between meals.     [provider]  ferrous sulfate 325 (65 FE) MG tablet Take 1 tablet (325 mg total) by mouth 2 (two) times daily with a meal. 11/13/19   [name], MD  isosorbide-hydrALAZINE (BIDIL) 20-37.5 MG tablet TAKE 1 TABLET BY MOUTH THREE TIMES DAILY 06/26/22   [name], MD  metoprolol tartrate (LOPRESSOR) 25 MG tablet TAKE 1 TABLET(25 MG) BY MOUTH TWICE DAILY Patient taking differently: Take 25 mg by mouth in the morning and at bedtime. 05/24/22   [name], MD  miconazole (ATHLETES FOOT) 2 % powder Apply 1 Application topically 2 (two) times daily as needed for itching (affected sites).    [provider]  morphine (MS CONTIN)  15 MG 12 hr tablet Take 1 tablet (15 mg total) by mouth 2 (two) times daily as needed for pain. Patient not taking: Reported on 07/03/2022 06/15/21   [name], MD  Nutritional Supplements (CARNATION BREAKFAST ESSENTIALS) LIQD Take 237 mLs by mouth daily.    [provider]  nystatin (MYCOSTATIN/NYSTOP) powder Apply 1 application. topically 3 (three) times daily. Patient not taking: Reported on 07/03/2022 10/24/21   Barrett, Lodema Hong, PA-C  osimertinib mesylate (TAGRISSO) 80 MG tablet Take 1 tablet (80 mg total) by mouth daily. 06/11/22   Curt Bears, MD  OXYGEN Inhale 2 L/min into the lungs continuous.    [provider]  prochlorperazine (COMPAZINE) 10 MG tablet Take 1 tablet (10 mg total) by mouth every 6 (six) hours as needed. Patient taking differently: Take 10 mg by mouth every 6 (six) hours as needed for nausea or vomiting. 06/08/21   Heilingoetter, Cassandra L, PA-C  torsemide (DEMADEX) 20 MG tablet TAKE 2 TABLETS(40 MG) BY MOUTH DAILY Patient taking differently: Take 40 mg by mouth in the morning. 05/28/22   [name], MD    Physical Exam: Vitals:   07/16/2022 1153 07/24/2022 1242 07/28/2022 1300  BP: (!) 144/82 (!) 151/119 (!) 145/115  Pulse: (!) 153 (!) 140 92  Resp: (!) 28 (!) 30 (!) 28  Temp:  98.9  F (37.2 C)   TempSrc:  Oral   SpO2: (!) 77% 99% 98%      Constitutional: Elderly frail, not in acute distress.  5 L nasal cannula Eyes: PERRL, lids and conjunctivae normal ENMT: Mucous membranes are moist. Posterior pharynx clear of any exudate or lesions.Normal dentition.  Neck: normal, supple, no masses, no thyromegaly Respiratory: Bilateral diminished breath sounds especially on the left Cardiovascular: Regular rate and rhythm, no murmurs / rubs / gallops. No extremity edema. 2+ pedal pulses. No carotid bruits.  Abdomen: no tenderness, no masses palpated. No hepatosplenomegaly. Bowel sounds positive.  Musculoskeletal: no clubbing / cyanosis. No joint deformity upper and lower extremities. Good ROM, no contractures. Normal muscle tone.  Skin: Chronic bilateral lower extremity skin changes Neurologic: CN 2-12 grossly intact. Sensation intact, DTR normal. Strength 5/5 in all 4.  Psychiatric: Normal judgment and insight. Alert and oriented x 3. Normal mood.     Labs on Admission: I have personally reviewed following labs and imaging studies  CBC: Recent Labs  Lab 07/03/22 0931 07/29/2022 1230  WBC 16.3* 14.4*  NEUTROABS 13.9* 12.5*  HGB 9.3* 9.0*  HCT 29.1* 29.6*  MCV 82.9 85.3  PLT 262 371   Basic Metabolic Panel: Recent Labs  Lab 07/03/22 0931 07/29/2022 1230  NA 136 135  K 4.0 4.8  CL 96* 99  CO2 31 28  GLUCOSE 189* 166*  BUN 49* 56*  CREATININE 1.67* 2.14*  CALCIUM 10.4* 9.9   GFR: CrCl cannot be calculated (Unknown ideal weight.). Liver Function Tests: Recent Labs  Lab 07/03/22 0931  AST 19  ALT 11  ALKPHOS 92  BILITOT 0.4  PROT 7.7  ALBUMIN 3.4*   No results for input(s): "LIPASE", "AMYLASE" in the last 168 hours. No results for input(s): "AMMONIA" in the last 168 hours. Coagulation Profile: No results for input(s): "INR", "PROTIME" in the last 168 hours. Cardiac Enzymes: No results for input(s): "CKTOTAL", "CKMB", "CKMBINDEX", "TROPONINI" in the  last 168 hours. BNP (last 3 results) No results for input(s): "PROBNP" in the last 8760 hours. HbA1C: No results for input(s): "HGBA1C" in the last 72 hours. CBG: No  results for input(s): "GLUCAP" in the last 168 hours. Lipid Profile: No results for input(s): "CHOL", "HDL", "LDLCALC", "TRIG", "CHOLHDL", "LDLDIRECT" in the last 72 hours. Thyroid Function Tests: No results for input(s): "TSH", "T4TOTAL", "FREET4", "T3FREE", "THYROIDAB" in the last 72 hours. Anemia Panel: No results for input(s): "VITAMINB12", "FOLATE", "FERRITIN", "TIBC", "IRON", "RETICCTPCT" in the last 72 hours. Urine analysis:    Component Value Date/Time   COLORURINE YELLOW 09/28/2020 [place] 09/28/2020 1550   LABSPEC 1.010 09/28/2020 1550   PHURINE 6.0 09/28/2020 1550   GLUCOSEU NEGATIVE 09/28/2020 1550   HGBUR NEGATIVE 09/28/2020 1550   HGBUR moderate 02/02/2008 1439   BILIRUBINUR Negative 06/27/2021 1533   KETONESUR NEGATIVE 09/28/2020 1550   PROTEINUR Positive (A) 06/27/2021 1533   PROTEINUR 30 (A) 08/09/2020 0700   UROBILINOGEN 0.2 06/27/2021 1533   UROBILINOGEN 0.2 09/28/2020 1550   NITRITE Negative 06/27/2021 1533   NITRITE NEGATIVE 09/28/2020 1550   LEUKOCYTESUR Negative 06/27/2021 1533   LEUKOCYTESUR TRACE (A) 09/28/2020 1550   Sepsis Labs: !!!!!!!!!!!!!!!!!!!!!!!!!!!!!!!!!!!!!!!!!!!! @LABRCNTIP (procalcitonin:4,lacticidven:4) ) Recent Results (from the past 240 hour(s))  Culture, blood (routine x 2)     Status: None (Preliminary result)   Collection Time: 07/03/22 11:58 AM   Specimen: BLOOD RIGHT HAND  Result Value Ref Range Status   Specimen Description   Final    BLOOD RIGHT HAND Performed at [facility], [place] [address], [place], [place] [address]    Special Requests   Final    BOTTLES DRAWN AEROBIC AND ANAEROBIC Blood Culture results may not be optimal due to an inadequate volume of blood received in culture bottles Performed at [facility] [address] [place], [place] [address]    Culture   Final    NO GROWTH 2 DAYS Performed at [facility] [address] [place], [place] [address]    Report Status PENDING  Incomplete  Culture, blood (routine x 2)     Status: None (Preliminary result)   Collection Time: 07/03/22 12:03 PM   Specimen: BLOOD  Result Value Ref Range Status   Specimen Description   Final    BLOOD LEFT ANTECUBITAL Performed at [facility] [address], [place], [place] [address]    Special Requests   Final    BOTTLES DRAWN AEROBIC AND ANAEROBIC Blood Culture results may not be optimal due to an inadequate volume of blood received in culture bottles Performed at [facility] [address] [place], [place] [address]    Culture   Final    NO GROWTH 2 DAYS Performed at [facility] [address] [place], [place] [address]    Report Status PENDING  Incomplete  Resp Panel by RT-PCR (Flu A&B, Covid) Anterior Nasal Swab     Status: None   Collection Time: 07/03/22 12:23 PM   Specimen: Anterior Nasal Swab  Result Value Ref Range Status   SARS Coronavirus 2 by RT PCR NEGATIVE NEGATIVE Final    Comment: (NOTE) SARS-CoV-2 target nucleic acids are NOT DETECTED.  The SARS-CoV-2 RNA is generally detectable in upper respiratory specimens during the acute phase of infection. The lowest concentration of SARS-CoV-2 viral copies this assay can detect is 138 copies/mL. A negative result does not preclude SARS-Cov-2 infection and should not be used as the sole basis for treatment or other patient management decisions. A negative result may occur with  improper specimen collection/handling, submission of specimen other than nasopharyngeal swab, presence  of viral mutation(s) within the areas targeted by this assay, and inadequate number of viral copies(<138 copies/mL). A negative result must be combined with clinical observations, patient  history, and epidemiological information. The expected result is Negative.  Fact Sheet for Patients:  EntrepreneurPulse.com.au  Fact Sheet for Healthcare Providers:  IncredibleEmployment.be  This test is no t yet approved or cleared by the [place] FDA and  has been authorized for detection and/or diagnosis of SARS-CoV-2 by FDA under an Emergency Use Authorization (EUA). This EUA will remain  in effect (meaning this test can be used) for the duration of the COVID-19 declaration under Section 564(b)(1) of the Act, 21 U.S.C.section 360bbb-3(b)(1), unless the authorization is terminated  or revoked sooner.       Influenza A by PCR NEGATIVE NEGATIVE Final   Influenza B by PCR NEGATIVE NEGATIVE Final    Comment: (NOTE) The Xpert Xpress SARS-CoV-2/FLU/RSV plus assay is intended as an aid in the diagnosis of influenza from Nasopharyngeal swab specimens and should not be used as a sole basis for treatment. Nasal washings and aspirates are unacceptable for Xpert Xpress SARS-CoV-2/FLU/RSV testing.  Fact Sheet for Patients: EntrepreneurPulse.com.au  Fact Sheet for Healthcare Providers: IncredibleEmployment.be  This test is not yet approved or cleared by the [place] FDA and has been authorized for detection and/or diagnosis of SARS-CoV-2 by FDA under an Emergency Use Authorization (EUA). This EUA will remain in effect (meaning this test can be used) for the duration of the COVID-19 declaration under Section 564(b)(1) of the Act, 21 U.S.C. section 360bbb-3(b)(1), unless the authorization is terminated or revoked.  Performed at [facility], [place] [address], [place], [address] [address]      Radiological Exams on Admission: DG Chest Port 1 View  Result Date: 07/10/2022 CLINICAL DATA:  Shortness of breath EXAM: PORTABLE CHEST 1 VIEW COMPARISON:  Previous studies including the  examination done on 07/03/2022 FINDINGS: Transverse diameter of heart is increased. Central pulmonary vessels are prominent. Increased interstitial and alveolar markings are seen in parahilar regions and lower lung fields. Left hemidiaphragm is elevated. Small to moderate bilateral pleural effusions are seen, more so on the left side. There is interval increase in amount of left pleural effusion. There is no pneumothorax. IMPRESSION: Cardiomegaly. CHF. Small to moderate bilateral pleural effusions, more so on the left side with interval increase. Electronically Signed   By: Elmer Picker M.D.   On: 07/26/2022 13:18   CT Angio Chest PE W and/or Wo Contrast  Result Date: 07/03/2022 CLINICAL DATA:  Stage IV lung cancer. Tachycardia the cancer center. Shortness of breath with chills and cough. Chest pain weakness. * Tracking Code: BO * EXAM: CT ANGIOGRAPHY CHEST WITH CONTRAST TECHNIQUE: Multidetector CT imaging of the chest was performed using the pulmonary embolus protocol during bolus administration of intravenous contrast. Multiplanar CT image reconstructions and MIPs were obtained to evaluate the vascular anatomy. RADIATION DOSE REDUCTION: This exam was performed according to the departmental dose-optimization program which includes automated exposure control, adjustment of the mA and/or kV according to patient size and/or use of iterative reconstruction technique. CONTRAST:  38m OMNIPAQUE IOHEXOL 350 MG/ML SOLN COMPARISON:  04/24/2022 FINDINGS: Cardiovascular: No filling defect is identified in the pulmonary arterial tree to suggest pulmonary embolus. Dilute contrast medium is present in the thoracic aorta, without specific findings of acute aortic abnormality. Extensive coronary and aortic atherosclerosis and branch vessel atherosclerosis. Cardiomegaly is present. Mediastinum/Nodes: AP window lymph node 1.0 cm in short axis on image 58 series  4, previously 0.7 cm. Precarinal lymph node 1.2 cm in short  axis, previously 0.8 cm. Other thoracic lymph nodes appear mildly abnormally enlarged and mildly increased from previous. Subcarinal node 2.2 cm in short axis on image 79 series 4, formerly 1.6 cm. Increased right infrahilar adenopathy. Lungs/Pleura: Small bilateral pleural effusions with loculated components on the left. Increased rind like pleural thickening the left lung apex for example on image 46 of series 4, concerning for pleural malignancy. Likewise malignant pleural effusion cannot be excluded on the right, and there is some focal nodularity along the right lateral pleural margin. Progressive atelectasis in the left lower lobe company by the pleural effusion. Bilateral airway thickening. Secondary pulmonary lobular septal thickening at the lung apices favoring interstitial edema with patchy ground-glass densities in both lungs likewise a likely manifestation of edema. Increased atelectasis in the right lower lobe associated with the increasing right pleural effusion. Patchy confluent regions of airspace opacity in the right perihilar region, generally favoring edema. The prior faint ground-glass density nodule seen in the right upper lobe on the prior exam is completely obscured by surrounding edema and other ground-glass opacities and indistinct nodular opacities in the right lung. Upper Abdomen: Nonspecific hypodense right kidney upper pole lesions, probably cysts. The patient had a left renal mass along the lower pole on 08/14/2021, this vertical level was not included on today's chest CT. Musculoskeletal: Stable inferior endplate compression at T12. Review of the MIP images confirms the above findings. IMPRESSION: 1. No filling defect is identified in the pulmonary arterial tree to suggest pulmonary embolus. 2. Small bilateral pleural effusions with loculated components on the left. The pleural effusion is substantially increased on the right side. Increased rind like pleural thickening at the left  lung apex, concerning for pleural malignancy. Likewise, there is some focal nodularity along the right lateral pleural margin which could be from pleural metastatic disease. 3. Increased mediastinal and right infrahilar adenopathy, concerning for malignancy. 4. Cardiomegaly interstitial edema with patchy ground-glass opacities in both lungs, favoring pulmonary edema. There is also some overt airspace opacity in the right lung likely from edema, less likely pneumonia. 5. The prior faint ground-glass density nodule in the right upper lobe is completely obscured by surrounding edema and other indistinct ground-glass opacities in the right lung. 6. The patient had an enhancing left renal lower pole mass on 08/14/2021, this vertical level was not included on today's chest CT. 7. Extensive coronary and aortic atherosclerosis. 8. Stable inferior endplate compression at T12. Aortic Atherosclerosis (ICD10-I70.0). Electronically Signed   By: Van Clines M.D.   On: 07/03/2022 15:22     All images have been reviewed by me personally.  EKG: Independently reviewed. A fib rvr  Assessment/Plan Active Problems:   Adenocarcinoma of left lung, stage 4 (HCC)   HTN (hypertension)   CHF (congestive heart failure), NYHA class III, chronic, diastolic (HCC)   COPD (chronic obstructive pulmonary disease) (HCC)   GERD   MGUS (monoclonal gammopathy of unknown significance)   Chronic kidney disease, stage 3b (HCC)   Atrial fibrillation with RVR (HCC)   Acute on chronic hypoxic respiratory distress -At baseline uses 2 L nasal cannula, currently on 5 L nasal cannula.  Likely from worsening effusion, atrial fibrillation with RVR, CHF exacerbation.  Management as below.  Recent CTA negative for PE but does have possible loculated effusion and recurrent malignant effusions. - I-S/flutter valve, bronchodilators as needed.  Moderate left-sided pleural effusion - This has been somewhat chronic in nature.  Previously  established malignant pleural effusion.  Discussed with Pulm, they will evaluate her to see if [facility] is a better option as this is chronic and recurrent.   Atrial fibrillation with RVR - Currently on Cardizem drip.  Will resume home metoprolol, IV as needed ordered.  Not on anticoagulation due to previous history of significant GI bleeds.  Acute congestive heart failure with preserved ejection fraction, 70%.  Class IV - Exacerbated by atrial fibrillation with RVR.  Will place her on Lasix 40 mg IV twice daily, fluid restriction, input and output.  Daily weights.  Will update echocardiogram, consulted [facility] cardiology who will see the patient tomorrow.  Acute kidney injury - Creatinine baseline 1.6, today 2.14.  Hopefully this will improve with diuresis.  May have to back off if renal function worsens.  Stage IV nonsmall cell lung cancer - Follows with Dr. Earlie Server.  Currently on daily Tagrisso.  On hold per recent recommendation while hospitalized.  History of COPD - As needed bronchodilators  Anxiety - As needed Klonopin    DVT prophylaxis: SQ Hep Code Status: partial Family Communication: Granddaughter at bedside Consults called: [facility] cardiology consulted, will see the patient tomorrow.  Discussed with PCCM who will see the patient Admission status: Stepdown admission  Status is: Inpatient Remains inpatient appropriate because: Admit patient to stepdown unit on Cardizem drip   Time Spent: 65 minutes.  >50% of the time was devoted to discussing the patients care, assessment, plan and disposition with other care givers along with counseling the patient about the risks and benefits of treatment.    Kalaya Infantino Arsenio Loader MD [facility]  If 7PM-7AM, please contact night-coverage   07/14/2022, 2:32 PM

## 2022-07-05 NOTE — ED Provider Notes (Signed)
[facility] DEPT Provider Note   CSN: [id number] Arrival date & time: 07/16/2022  1137     History  Chief Complaint  Patient presents with   Shortness of Breath    [name] is a 81 y.o. female.  This is a 81 year old female presents with increasing dyspnea.  Patient was seen in the ED 2 days ago and diagnosed with large left-sided pleural effusion.  She was recommended for admission for left lung thoracentesis.  She declined admission at that time and decided to go home.  Since that time, she has had increasing dyspnea on exertion.  Patient normally uses 2 L of oxygen at home and has now had to increase that to 6 L.  She denies any hemoptysis.  No anginal type chest pain.  No recent fever or chills.       Home Medications Prior to Admission medications   Medication Sig Start Date End Date Taking? Authorizing Provider  acetaminophen (TYLENOL) 325 MG tablet Take 325-650 mg by mouth daily as needed for moderate pain, fever or headache.     [provider]  albuterol (PROAIR HFA) 108 (90 Base) MCG/ACT inhaler Inhale 1-2 puffs into the lungs every 6 (six) hours as needed for wheezing or shortness of breath. Patient taking differently: Inhale 1-2 puffs into the lungs every 4 (four) hours as needed for wheezing or shortness of breath (or coughing). 10/30/16   [name], MD  allopurinol (ZYLOPRIM) 100 MG tablet Take 1 tablet (100 mg total) by mouth daily. 02/06/22   [name], MD  BREO ELLIPTA 100-25 MCG/ACT AEPB INHALE 1 PUFF INTO THE LUNGS DAILY 02/24/22   [name], MD  clonazePAM (KLONOPIN) 1 MG tablet Take 1 tablet (1 mg total) by mouth 2 (two) times daily as needed for anxiety. Patient taking differently: Take 0.5 mg by mouth daily as needed for anxiety. 06/06/21   [name], MD  cyanocobalamin 2000 MCG tablet Take 1 tablet (2,000 mcg total) by mouth daily. Patient not taking: Reported on 07/03/2022 12/06/21   [name], MD  Ensure (ENSURE) Take 237 mLs by mouth 2 (two) times daily between meals.    [provider]  ferrous sulfate 325 (65 FE) MG tablet Take 1 tablet (325 mg total) by mouth 2 (two) times daily with a meal. 11/13/19   [name], MD  isosorbide-hydrALAZINE (BIDIL) 20-37.5 MG tablet TAKE 1 TABLET BY MOUTH THREE TIMES DAILY 06/26/22   [name], MD  metoprolol tartrate (LOPRESSOR) 25 MG tablet TAKE 1 TABLET(25 MG) BY MOUTH TWICE DAILY Patient taking differently: Take 25 mg by mouth in the morning and at bedtime. 05/24/22   [name], MD  miconazole (ATHLETES FOOT) 2 % powder Apply 1 Application topically 2 (two) times daily as needed for itching (affected sites).    [provider]  morphine (MS CONTIN) 15 MG 12 hr tablet Take 1 tablet (15 mg total) by mouth 2 (two) times daily as needed for pain. Patient not taking: Reported on 07/03/2022 06/15/21   [name], MD  Nutritional Supplements (CARNATION BREAKFAST ESSENTIALS) LIQD Take 237 mLs by mouth daily.    [provider]  nystatin (MYCOSTATIN/NYSTOP) powder Apply 1 application. topically 3 (three) times daily. Patient not taking: Reported on 07/03/2022 10/24/21   Barrett, Lodema Hong, PA-C  osimertinib mesylate (TAGRISSO) 80 MG tablet Take 1 tablet (80 mg total) by mouth daily. 06/11/22   Curt Bears, MD  OXYGEN Inhale 2 L/min into the lungs continuous.    [provider]  prochlorperazine (COMPAZINE) 10 MG tablet Take 1 tablet (10 mg total) by mouth every 6 (six) hours as needed. Patient taking differently: Take 10 mg by mouth every 6 (six) hours as needed for nausea or vomiting. 06/08/21   Heilingoetter, Cassandra L, PA-C  torsemide (DEMADEX) 20 MG tablet TAKE 2 TABLETS(40 MG) BY MOUTH DAILY Patient taking differently: Take 40 mg by mouth in the morning. 05/28/22   [name], MD      Allergies    Celebrex [celecoxib], Amlodipine besylate, Enalapril, Lipitor [atorvastatin],  Trelegy ellipta [fluticasone-umeclidin-vilant], Amoxicillin, Codeine sulfate, Hydrocodone-acetaminophen, and Tape    Review of Systems   Review of Systems  All other systems reviewed and are negative.   Physical Exam Updated Vital Signs BP (!) 144/82   Pulse (!) 153   Resp (!) 28   SpO2 (!) 77%  Physical Exam Vitals and nursing note reviewed.  Constitutional:      General: She is not in acute distress.    Appearance: Normal appearance. She is well-developed. She is not toxic-appearing.  HENT:     Head: Normocephalic and atraumatic.  Eyes:     General: Lids are normal.     Conjunctiva/sclera: Conjunctivae normal.     Pupils: Pupils are equal, round, and reactive to light.  Neck:     Thyroid: No thyroid mass.     Trachea: No tracheal deviation.  Cardiovascular:     Rate and Rhythm: Normal rate and regular rhythm.     Heart sounds: Normal heart sounds. No murmur heard.    No gallop.  Pulmonary:     Effort: Tachypnea and accessory muscle usage present. No respiratory distress.     Breath sounds: No stridor. Decreased breath sounds and rhonchi present. No wheezing or rales.  Abdominal:     General: There is no distension.     Palpations: Abdomen is soft.     Tenderness: There is no abdominal tenderness. There is no rebound.  Musculoskeletal:        General: No tenderness. Normal range of motion.     Cervical back: Normal range of motion and neck supple.  Skin:    General: Skin is warm and dry.     Findings: No abrasion or rash.  Neurological:     Mental Status: She is alert and oriented to person, place, and time. Mental status is at baseline.     GCS: GCS eye subscore is 4. GCS verbal subscore is 5. GCS motor subscore is 6.     Cranial Nerves: No cranial nerve deficit.     Sensory: No sensory deficit.     Motor: Motor function is intact.  Psychiatric:        Attention and Perception: Attention normal.        Speech: Speech normal.        Behavior: Behavior normal.      ED Results / Procedures / Treatments   Labs (all labs ordered are listed, but only abnormal results are displayed) Labs Reviewed  CBC WITH DIFFERENTIAL/PLATELET  BRAIN NATRIURETIC PEPTIDE  BASIC METABOLIC PANEL    EKG EKG Interpretation  Date/Time:  Thursday July 05 2022 12:35:24 EST Ventricular Rate:  145 PR Interval:    QRS Duration: 79 QT Interval:  287 QTC Calculation: 446 R Axis:   43 Text Interpretation: Atrial fibrillation with rapid V-rate Anterior infarct, old Nonspecific T abnormalities, lateral leads Confirmed by Zenia Resides,  Elberta Fortis ([id number]) on 07/29/2022 1:40:37 PM  Radiology CT Angio Chest PE W and/or Wo Contrast  Result Date: 07/03/2022 CLINICAL DATA:  Stage IV lung cancer. Tachycardia the cancer center. Shortness of breath with chills and cough. Chest pain weakness. * Tracking Code: BO * EXAM: CT ANGIOGRAPHY CHEST WITH CONTRAST TECHNIQUE: Multidetector CT imaging of the chest was performed using the pulmonary embolus protocol during bolus administration of intravenous contrast. Multiplanar CT image reconstructions and MIPs were obtained to evaluate the vascular anatomy. RADIATION DOSE REDUCTION: This exam was performed according to the departmental dose-optimization program which includes automated exposure control, adjustment of the mA and/or kV according to patient size and/or use of iterative reconstruction technique. CONTRAST:  32m OMNIPAQUE IOHEXOL 350 MG/ML SOLN COMPARISON:  04/24/2022 FINDINGS: Cardiovascular: No filling defect is identified in the pulmonary arterial tree to suggest pulmonary embolus. Dilute contrast medium is present in the thoracic aorta, without specific findings of acute aortic abnormality. Extensive coronary and aortic atherosclerosis and branch vessel atherosclerosis. Cardiomegaly is present. Mediastinum/Nodes: AP window lymph node 1.0 cm in short axis on image 58 series 4, previously 0.7 cm. Precarinal lymph node 1.2 cm in short axis,  previously 0.8 cm. Other thoracic lymph nodes appear mildly abnormally enlarged and mildly increased from previous. Subcarinal node 2.2 cm in short axis on image 79 series 4, formerly 1.6 cm. Increased right infrahilar adenopathy. Lungs/Pleura: Small bilateral pleural effusions with loculated components on the left. Increased rind like pleural thickening the left lung apex for example on image 46 of series 4, concerning for pleural malignancy. Likewise malignant pleural effusion cannot be excluded on the right, and there is some focal nodularity along the right lateral pleural margin. Progressive atelectasis in the left lower lobe company by the pleural effusion. Bilateral airway thickening. Secondary pulmonary lobular septal thickening at the lung apices favoring interstitial edema with patchy ground-glass densities in both lungs likewise a likely manifestation of edema. Increased atelectasis in the right lower lobe associated with the increasing right pleural effusion. Patchy confluent regions of airspace opacity in the right perihilar region, generally favoring edema. The prior faint ground-glass density nodule seen in the right upper lobe on the prior exam is completely obscured by surrounding edema and other ground-glass opacities and indistinct nodular opacities in the right lung. Upper Abdomen: Nonspecific hypodense right kidney upper pole lesions, probably cysts. The patient had a left renal mass along the lower pole on 08/14/2021, this vertical level was not included on today's chest CT. Musculoskeletal: Stable inferior endplate compression at T12. Review of the MIP images confirms the above findings. IMPRESSION: 1. No filling defect is identified in the pulmonary arterial tree to suggest pulmonary embolus. 2. Small bilateral pleural effusions with loculated components on the left. The pleural effusion is substantially increased on the right side. Increased rind like pleural thickening at the left lung  apex, concerning for pleural malignancy. Likewise, there is some focal nodularity along the right lateral pleural margin which could be from pleural metastatic disease. 3. Increased mediastinal and right infrahilar adenopathy, concerning for malignancy. 4. Cardiomegaly interstitial edema with patchy ground-glass opacities in both lungs, favoring pulmonary edema. There is also some overt airspace opacity in the right lung likely from edema, less likely pneumonia. 5. The prior faint ground-glass density nodule in the right upper lobe is completely obscured by surrounding edema and other indistinct ground-glass opacities in the right lung. 6. The patient had an enhancing left renal lower pole mass on 08/14/2021, this vertical level was not included  on today's chest CT. 7. Extensive coronary and aortic atherosclerosis. 8. Stable inferior endplate compression at T12. Aortic Atherosclerosis (ICD10-I70.0). Electronically Signed   By: Van Clines M.D.   On: 07/03/2022 15:22    Procedures Procedures    Medications Ordered in ED Medications  0.9 %  sodium chloride infusion (has no administration in time range)    ED Course/ Medical Decision Making/ A&P                           Medical Decision Making Amount and/or Complexity of Data Reviewed Labs: ordered. Radiology: ordered. ECG/medicine tests: ordered.  Risk Prescription drug management.   Patient is EKG per interpretation shows no signs of acute ischemic changes.  Does have A-fib with RVR.  Patient given Cardizem bolus as well as drip.  Reassessed and heart rate is greatly improved.  Patient's chest x-ray per interpretation shows evidence of CHF with very large left-sided pleural effusion which is worse than a few days ago.  Will give patient dose of IV Lasix here.  Suspect that patient will need thoracentesis at some point.  Will consult hospitalist for mission.  Plan discussed with family at bedside and they are in agreement  CRITICAL  CARE Performed by: Leota Jacobsen Total critical care time: 60 minutes Critical care time was exclusive of separately billable procedures and treating other patients. Critical care was necessary to treat or prevent imminent or life-threatening deterioration. Critical care was time spent personally by me on the following activities: development of treatment plan with patient and/or surrogate as well as nursing, discussions with consultants, evaluation of patient's response to treatment, examination of patient, obtaining history from patient or surrogate, ordering and performing treatments and interventions, ordering and review of laboratory studies, ordering and review of radiographic studies, pulse oximetry and re-evaluation of patient's condition.         Final Clinical Impression(s) / ED Diagnoses Final diagnoses:  None    Rx / DC Orders ED Discharge Orders     None         Lacretia Leigh, MD 07/15/2022 1342

## 2022-07-05 NOTE — Consult Note (Addendum)
NAME:  [name], MRN:  [id number], DOB:  [birth year], LOS: 0 ADMISSION DATE:  07/11/2022, CONSULTATION DATE:  07/07/2022 REFERRING MD:  [name], CHIEF COMPLAINT:  dyspnea, hypoxia   History of Present Illness:  80yF with history of metastatic adenoCA lung EGFR+ on tagrisso, recurrent left MPE s/p pleurX and removal 0/6301, diastolic heart failure, COPD, MGUS, chronic hypoxic respiratory failure on 2L o2 who was in her USOH until about 5d PTA developed dyspnea, palpitations. In clinic with Dr. Julien [name] 12/5 mentioned low grade fever, weakness, cough with some sputum production. She needed increase in her O2 from 2L O2 to 5L O2 to maintain saturation, was observed to have HR in 140s in clinic and was recommended to present to ED.  Here found to be in AF/RVR started on dilt gtt, acute on chronic hypoxic respiratory failure with possibility of CAP and slight increase in bilateral pleural effusions with plan to assess response to diuretic. PCCM consulted for increased size of left pleural effusion.  Pertinent  Medical History  Metastatic adenoca Recurrent left MPE s/p PleurX and removal 12/107 Diastolic heart failure COPD MGUS  Chronic hypoxic respiratory failure on 2L O2  Significant Hospital Events: Including procedures, antibiotic start and stop dates in addition to other pertinent events   07/06/2022 admitted, diuresing and working on rate control  Interim History / Subjective:    Objective   Blood pressure (!) 145/115, pulse 92, temperature 98.9 F (37.2 C), temperature source Oral, resp. rate (!) 28, SpO2 98 %.       No intake or output data in the 24 hours ending 06/30/2022 1443 There were no vitals filed for this visit.  Examination: General appearance: 81 y.o., female, frail Eyes: PERRL, tracking appropriately Lungs: diminished bl without wheeze, tachypneic CV: IRIR Abdomen: Soft, non-tender; non-distended, BS present  Extremities: 1-2+ ble edema, with chronic venous stasis  change, warm Neuro: weak but grossly nonfocal   Resolved Hospital Problem list    Assessment & Plan:   # Acute on chronic hypoxic respiratory failure # Multifocal nodular consolidation RUL, RLL atelectasis vs consolidation # Increased burden of bilateral pleural effusions Didn't seem like left thora made a huge difference in symptom relief and only tolerated 450cc fluid removal, suspect she has entrapment in setting chronic effusion. Recent CTA raises question of multifocal pneumonia vs pneumonitis vs progression of cancer (latter 2 seem less likely given pace). Does also look like her extravascular volume overload is worse in setting of AF/diastolic heart failure. - BCx, RVP, urine strep/legionella, procal - Ceftriaxone/azithromycin follow cultures and narrow as able - assess response to diuretic - routine thora studies ordered, will follow  # Goals of care  - Discussed code status with [name]. She is adamant that she would not want to be on the ventilator at all. I recommended against CPR/shocks/ACLS if that was truly the case. She is now DNR.  Best Practice (right click and "Reselect all SmartList Selections" daily)   Per [facility]  Labs   CBC: Recent Labs  Lab 07/03/22 0931 07/14/2022 1230  WBC 16.3* 14.4*  NEUTROABS 13.9* 12.5*  HGB 9.3* 9.0*  HCT 29.1* 29.6*  MCV 82.9 85.3  PLT 262 323    Basic Metabolic Panel: Recent Labs  Lab 07/03/22 0931 07/03/2022 1230  NA 136 135  K 4.0 4.8  CL 96* 99  CO2 31 28  GLUCOSE 189* 166*  BUN 49* 56*  CREATININE 1.67* 2.14*  CALCIUM 10.4* 9.9   GFR: CrCl cannot be calculated (  Unknown ideal weight.). Recent Labs  Lab 07/03/22 0931 07/03/22 1216 07/14/2022 1230  WBC 16.3*  --  14.4*  LATICACIDVEN  --  0.9  --     Liver Function Tests: Recent Labs  Lab 07/03/22 0931  AST 19  ALT 11  ALKPHOS 92  BILITOT 0.4  PROT 7.7  ALBUMIN 3.4*   No results for input(s): "LIPASE", "AMYLASE" in the last 168 hours. No results for  input(s): "AMMONIA" in the last 168 hours.  ABG    Component Value Date/Time   PHART 7.293 (L) 08/11/2020 1849   PCO2ART 51.7 (H) 08/11/2020 1849   PO2ART 105 08/11/2020 1849   HCO3 24.3 08/11/2020 1849   TCO2 37.1 11/20/2014 1453   ACIDBASEDEF 1.7 08/11/2020 1849   O2SAT 96.6 08/11/2020 1849     Coagulation Profile: No results for input(s): "INR", "PROTIME" in the last 168 hours.  Cardiac Enzymes: No results for input(s): "CKTOTAL", "CKMB", "CKMBINDEX", "TROPONINI" in the last 168 hours.  HbA1C: Hgb A1c MFr Bld  Date/Time Value Ref Range Status  11/05/2017 11:03 AM 5.4 4.6 - 6.5 % Final    Comment:    Glycemic Control Guidelines for People with Diabetes:Non Diabetic:  <6%Goal of Therapy: <7%Additional Action Suggested:  >8%   08/19/2017 03:01 PM 5.7 4.6 - 6.5 % Final    Comment:    Glycemic Control Guidelines for People with Diabetes:Non Diabetic:  <6%Goal of Therapy: <7%Additional Action Suggested:  >8%     CBG: No results for input(s): "GLUCAP" in the last 168 hours.  Review of Systems:   12 point review of systems is negative except as in HPI  Past Medical History:  She,  has a past medical history of Allergy, Angiodysplasia of cecum, Angiodysplasia of duodenum, Arthritis, Asteatotic eczema (02/25/2016), Asthma, Atrial fibrillation ([place]) (10/17/2017), Blood transfusion without reported diagnosis, Cataract, CHF (congestive heart failure), NYHA class III, chronic, combined ([place]) (08/10/2017), Colonic polyp (02/16/2008), COPD (chronic obstructive pulmonary disease) ([place]) (01/27/1998), Cor pulmonale ([place]) (11/16/2014), GERD (07/30/1992), Gout, HTN (hypertension) (07/30/1988), Hyperlipidemia with target LDL less than 100 (11/27/1996), Kidney stone (1960, 1972, 1991), lung ca (04/2021), Medical history non-contributory, MGUS (monoclonal gammopathy of unknown significance) (11/08/2016), Morbid obesity ([place]) (04/19/2010), OSA (obstructive sleep apnea) (06/22/2013), Osteopenia,  senile (02/05/2013), Prediabetes (11/13/2006), Renal insufficiency, Stenosis of cervical spine with myelopathy ([place]) (01/01/2018), Supplemental oxygen dependent, Ulcer of leg, chronic, right ([place]) (10/10/2011), ULCERATIVE COLITIS-LEFT SIDE (03/19/2008), Vitamin B12 deficiency anemia (11/13/2006), and Vitamin D deficiency (02/06/2013).   Surgical History:   Past Surgical History:  Procedure Laterality Date   BRONCHOSCOPY     CHEST TUBE INSERTION Left 06/15/2021   Procedure: INSERTION PLEURAL DRAINAGE CATHETER;  Surgeon: [name], MD;  Location: [facility];  Service: Thoracic;  Laterality: Left;   COLONOSCOPY     COLONOSCOPY WITH PROPOFOL N/A 02/23/2015   Procedure: COLONOSCOPY WITH PROPOFOL;  Surgeon: Ladene Artist, MD;  Location: [facility];  Service: Endoscopy;  Laterality: N/A;   COLONOSCOPY WITH PROPOFOL N/A 08/12/2018   Procedure: COLONOSCOPY WITH PROPOFOL;  Surgeon: Ladene Artist, MD;  Location: [facility];  Service: Endoscopy;  Laterality: N/A;   COLONOSCOPY WITH PROPOFOL N/A 05/09/2020   Procedure: COLONOSCOPY WITH PROPOFOL;  Surgeon: Ladene Artist, MD;  Location: [facility];  Service: Endoscopy;  Laterality: N/A;  To Splenic Flexture   COLONOSCOPY WITH PROPOFOL N/A 11/23/2021   Procedure: COLONOSCOPY WITH PROPOFOL;  Surgeon: Mauri Pole, MD;  Location: [facility];  Service: Gastroenterology;  Laterality: N/A;   ESOPHAGOGASTRODUODENOSCOPY N/A 11/23/2021  Procedure: ESOPHAGOGASTRODUODENOSCOPY (EGD);  Surgeon: Mauri Pole, MD;  Location: [facility];  Service: Gastroenterology;  Laterality: N/A;   ESOPHAGOGASTRODUODENOSCOPY (EGD) WITH PROPOFOL N/A 02/23/2015   Procedure: ESOPHAGOGASTRODUODENOSCOPY (EGD) WITH PROPOFOL;  Surgeon: Ladene Artist, MD;  [facility]: [facility];  Service: Endoscopy;  Laterality: N/A;   ESOPHAGOGASTRODUODENOSCOPY (EGD) WITH PROPOFOL N/A 08/12/2018   Procedure: ESOPHAGOGASTRODUODENOSCOPY (EGD) WITH PROPOFOL;  Surgeon: Ladene Artist, MD;  [facility]: [facility];  Service: Endoscopy;  Laterality: N/A;   ESOPHAGOGASTRODUODENOSCOPY (EGD) WITH PROPOFOL N/A 05/08/2020   Procedure: ESOPHAGOGASTRODUODENOSCOPY (EGD) WITH PROPOFOL;  Surgeon: Yetta Flock, MD;  Location: [facility];  Service: Gastroenterology;  Laterality: N/A;   HEMOSTASIS CLIP PLACEMENT  11/23/2021   Procedure: HEMOSTASIS CLIP PLACEMENT;  Surgeon: Mauri Pole, MD;  Location: [facility];  Service: Gastroenterology;;   HOT HEMOSTASIS N/A 08/12/2018   Procedure: HOT HEMOSTASIS (ARGON PLASMA COAGULATION/BICAP);  Surgeon: Ladene Artist, MD;  Location: [facility];  Service: Endoscopy;  Laterality: N/A;  EGD and Colon APC   HOT HEMOSTASIS N/A 05/08/2020   Procedure: HOT HEMOSTASIS (ARGON PLASMA COAGULATION/BICAP);  Surgeon: Yetta Flock, MD;  Location: [facility];  Service: Gastroenterology;  Laterality: N/A;   HOT HEMOSTASIS N/A 11/23/2021   Procedure: HOT HEMOSTASIS (ARGON PLASMA COAGULATION/BICAP);  Surgeon: Mauri Pole, MD;  Location: [facility];  Service: Gastroenterology;  Laterality: N/A;   LAPAROTOMY N/A 08/11/2020   Procedure: EXPLORATORY LAPAROTOMY PARTIAL COLECTOMY AND COLOSTOMY WITH RESECTION OF A MESSENTERIC MASS;  Surgeon: Coralie Keens, MD;  Location: [facility];  Service: General;  Laterality: N/A;   POLYPECTOMY     REMOVAL OF PLEURAL DRAINAGE CATHETER N/A 10/17/2021   Procedure: MINOR REMOVAL OF PLEURAL DRAINAGE CATHETER;  Surgeon: [name], MD;  Location: [place];  Service: Cardiopulmonary;  Laterality: N/A;     Social History:   reports that she quit smoking about 26 years ago. Her smoking use included cigarettes. She has never used smokeless tobacco. She reports that she does not drink alcohol and does not use drugs.   Family History:  Her family history includes Cervical cancer in her sister; Clotting disorder in her sister; Crohn's disease in her brother; Diabetes in her mother and  sister; Heart attack in her sister; Heart disease in her brother, brother, mother, and sister; Kidney cancer in her mother; Lung cancer in her sister; Stomach cancer in her mother; Stroke in her father and mother. There is no history of Colon cancer, Esophageal cancer, or Rectal cancer.   Allergies Allergies  Allergen Reactions   Celebrex [Celecoxib] Swelling and Other (See Comments)    Ankles swell   Amlodipine Besylate Swelling and Other (See Comments)    Ankles swell   Enalapril Cough   Lipitor [Atorvastatin] Other (See Comments)    Muscle aches   Trelegy Ellipta [Fluticasone-Umeclidin-Vilant] Other (See Comments)    Made the throat and esophagus "hurt" Uses Breo (fluticasone - vilanterol) on a regular basis   Amoxicillin Diarrhea    DID THE REACTION INVOLVE: Swelling of the face/tongue/throat, SOB, or low BP? N Sudden or severe rash/hives, skin peeling, or the inside of the mouth or nose? N Did it require medical treatment? N When did it last happen?      MORE THAN 10 YEARS If all above answers are "NO", may proceed with cephalosporin use.    Codeine Sulfate Nausea Only   Hydrocodone-Acetaminophen Nausea Only   Tape Itching, Rash and Other (See Comments)    Redness, also  Home Medications  Prior to Admission medications   Medication Sig Start Date End Date Taking? Authorizing Provider  acetaminophen (TYLENOL) 325 MG tablet Take 325-650 mg by mouth daily as needed for moderate pain, fever or headache.     [provider]  albuterol (PROAIR HFA) 108 (90 Base) MCG/ACT inhaler Inhale 1-2 puffs into the lungs every 6 (six) hours as needed for wheezing or shortness of breath. Patient taking differently: Inhale 1-2 puffs into the lungs every 4 (four) hours as needed for wheezing or shortness of breath (or coughing). 10/30/16   [name], MD  allopurinol (ZYLOPRIM) 100 MG tablet Take 1 tablet (100 mg total) by mouth daily. 02/06/22   [name], MD  BREO ELLIPTA  100-25 MCG/ACT AEPB INHALE 1 PUFF INTO THE LUNGS DAILY 02/24/22   [name], MD  clonazePAM (KLONOPIN) 1 MG tablet Take 1 tablet (1 mg total) by mouth 2 (two) times daily as needed for anxiety. Patient taking differently: Take 0.5 mg by mouth daily as needed for anxiety. 06/06/21   [name], MD  cyanocobalamin 2000 MCG tablet Take 1 tablet (2,000 mcg total) by mouth daily. Patient not taking: Reported on 07/03/2022 12/06/21   [name], MD  Ensure (ENSURE) Take 237 mLs by mouth 2 (two) times daily between meals.    [provider]  ferrous sulfate 325 (65 FE) MG tablet Take 1 tablet (325 mg total) by mouth 2 (two) times daily with a meal. 11/13/19   [name], MD  isosorbide-hydrALAZINE (BIDIL) 20-37.5 MG tablet TAKE 1 TABLET BY MOUTH THREE TIMES DAILY 06/26/22   [name], MD  metoprolol tartrate (LOPRESSOR) 25 MG tablet TAKE 1 TABLET(25 MG) BY MOUTH TWICE DAILY Patient taking differently: Take 25 mg by mouth in the morning and at bedtime. 05/24/22   [name], MD  miconazole (ATHLETES FOOT) 2 % powder Apply 1 Application topically 2 (two) times daily as needed for itching (affected sites).    [provider]  morphine (MS CONTIN) 15 MG 12 hr tablet Take 1 tablet (15 mg total) by mouth 2 (two) times daily as needed for pain. Patient not taking: Reported on 07/03/2022 06/15/21   [name], MD  Nutritional Supplements (CARNATION BREAKFAST ESSENTIALS) LIQD Take 237 mLs by mouth daily.    [provider]  nystatin (MYCOSTATIN/NYSTOP) powder Apply 1 application. topically 3 (three) times daily. Patient not taking: Reported on 07/03/2022 10/24/21   Barrett, Lodema Hong, PA-C  osimertinib mesylate (TAGRISSO) 80 MG tablet Take 1 tablet (80 mg total) by mouth daily. 06/11/22   Curt Bears, MD  OXYGEN Inhale 2 L/min into the lungs continuous.    [provider]  prochlorperazine (COMPAZINE) 10 MG tablet Take 1 tablet (10 mg total)  by mouth every 6 (six) hours as needed. Patient taking differently: Take 10 mg by mouth every 6 (six) hours as needed for nausea or vomiting. 06/08/21   Heilingoetter, Cassandra L, PA-C  torsemide (DEMADEX) 20 MG tablet TAKE 2 TABLETS(40 MG) BY MOUTH DAILY Patient taking differently: Take 40 mg by mouth in the morning. 05/28/22   [name], MD     Critical care time: n/a

## 2022-07-05 NOTE — Telephone Encounter (Signed)
Granddaughter called and left message stating that [name] "could not breath and needed to be drained."  Dr. Julien Nordmann directed patient to go to ED for evaluation and possible admission to manage symptoms. Spoke directly to granddaughter, [name]. Called and left VM for patient to call 911.

## 2022-07-06 ENCOUNTER — Inpatient Hospital Stay (HOSPITAL_COMMUNITY): Payer: Medicare Other

## 2022-07-06 DIAGNOSIS — I428 Other cardiomyopathies: Secondary | ICD-10-CM

## 2022-07-06 DIAGNOSIS — J90 Pleural effusion, not elsewhere classified: Secondary | ICD-10-CM | POA: Diagnosis not present

## 2022-07-06 DIAGNOSIS — I5023 Acute on chronic systolic (congestive) heart failure: Secondary | ICD-10-CM

## 2022-07-06 DIAGNOSIS — J9621 Acute and chronic respiratory failure with hypoxia: Secondary | ICD-10-CM | POA: Diagnosis not present

## 2022-07-06 DIAGNOSIS — I5033 Acute on chronic diastolic (congestive) heart failure: Secondary | ICD-10-CM | POA: Diagnosis not present

## 2022-07-06 DIAGNOSIS — I480 Paroxysmal atrial fibrillation: Secondary | ICD-10-CM | POA: Diagnosis not present

## 2022-07-06 LAB — ECHOCARDIOGRAM COMPLETE
Height: 65 in
S' Lateral: 3 cm
Weight: 1432.11 oz

## 2022-07-06 LAB — RESPIRATORY PANEL BY PCR

## 2022-07-06 LAB — CBC
HCT: 28.6 % — ABNORMAL LOW (ref 36.0–46.0)
Hemoglobin: 8.5 g/dL — ABNORMAL LOW (ref 12.0–15.0)
MCH: 26 pg (ref 26.0–34.0)
MCHC: 29.7 g/dL — ABNORMAL LOW (ref 30.0–36.0)
MCV: 87.5 fL (ref 80.0–100.0)
Platelets: 246 10*3/uL (ref 150–400)
RBC: 3.27 MIL/uL — ABNORMAL LOW (ref 3.87–5.11)
RDW: 14.4 % (ref 11.5–15.5)
WBC: 16.2 10*3/uL — ABNORMAL HIGH (ref 4.0–10.5)
nRBC: 0 % (ref 0.0–0.2)

## 2022-07-06 LAB — BASIC METABOLIC PANEL
Anion gap: 12 (ref 5–15)
BUN: 51 mg/dL — ABNORMAL HIGH (ref 8–23)
CO2: 23 mmol/L (ref 22–32)
Calcium: 9.2 mg/dL (ref 8.9–10.3)
Chloride: 96 mmol/L — ABNORMAL LOW (ref 98–111)
Creatinine, Ser: 2.35 mg/dL — ABNORMAL HIGH (ref 0.44–1.00)
GFR, Estimated: 20 mL/min — ABNORMAL LOW (ref >60)
Glucose, Bld: 132 mg/dL — ABNORMAL HIGH (ref 70–99)
Potassium: 4.7 mmol/L (ref 3.5–5.1)
Sodium: 131 mmol/L — ABNORMAL LOW (ref 135–145)

## 2022-07-06 LAB — MAGNESIUM: Magnesium: 2.3 mg/dL (ref 1.7–2.4)

## 2022-07-06 LAB — PROCALCITONIN: Procalcitonin: 0.57 ng/mL

## 2022-07-06 LAB — GLUCOSE, CAPILLARY: Glucose-Capillary: 99 mg/dL (ref 70–99)

## 2022-07-06 MED ORDER — FUROSEMIDE 10 MG/ML IJ SOLN
40.0000 mg | Freq: Once | INTRAMUSCULAR | Status: AC
  Administered 2022-07-06: 40 mg via INTRAVENOUS
  Filled 2022-07-06: qty 4

## 2022-07-06 MED ORDER — METHYLPREDNISOLONE SODIUM SUCC 40 MG IJ SOLR
40.0000 mg | Freq: Every day | INTRAMUSCULAR | Status: DC
  Administered 2022-07-06 – 2022-07-07 (×2): 40 mg via INTRAVENOUS
  Filled 2022-07-06 (×3): qty 1

## 2022-07-06 MED ORDER — IPRATROPIUM BROMIDE 0.02 % IN SOLN
0.5000 mg | Freq: Four times a day (QID) | RESPIRATORY_TRACT | Status: DC
  Administered 2022-07-06 – 2022-07-08 (×7): 0.5 mg via RESPIRATORY_TRACT
  Filled 2022-07-06 (×7): qty 2.5

## 2022-07-06 MED ORDER — METOPROLOL TARTRATE 25 MG PO TABS
75.0000 mg | ORAL_TABLET | Freq: Two times a day (BID) | ORAL | Status: DC
  Administered 2022-07-06 – 2022-07-07 (×2): 75 mg via ORAL
  Filled 2022-07-06 (×3): qty 3

## 2022-07-06 MED ORDER — CHLORHEXIDINE GLUCONATE CLOTH 2 % EX PADS
6.0000 | MEDICATED_PAD | Freq: Every day | CUTANEOUS | Status: DC
  Administered 2022-07-07 – 2022-07-08 (×2): 6 via TOPICAL

## 2022-07-06 MED ORDER — METOPROLOL TARTRATE 25 MG PO TABS: 50.0000 mg | ORAL_TABLET | Freq: Two times a day (BID) | ORAL | Status: DC

## 2022-07-06 MED ORDER — SODIUM CHLORIDE 0.9 % IV BOLUS
500.0000 mL | Freq: Once | INTRAVENOUS | Status: AC
  Administered 2022-07-06: 500 mL via INTRAVENOUS

## 2022-07-06 MED ORDER — AZITHROMYCIN 250 MG PO TABS
500.0000 mg | ORAL_TABLET | Freq: Every day | ORAL | Status: DC
  Administered 2022-07-06 – 2022-07-07 (×2): 500 mg via ORAL
  Filled 2022-07-06 (×2): qty 2

## 2022-07-06 MED ORDER — SODIUM CHLORIDE 0.9 % IV BOLUS
250.0000 mL | Freq: Once | INTRAVENOUS | Status: AC
  Administered 2022-07-06: 250 mL via INTRAVENOUS

## 2022-07-06 NOTE — Progress Notes (Signed)
Echocardiogram 2D Echocardiogram has been performed.  Oneal Deputy Jermichael Belmares RDCS 07/06/2022, 2:47 PM

## 2022-07-06 NOTE — Progress Notes (Signed)
2020 patient alert x4 slow to respond able to make all needs known on 5L [place] noted to be in AFIB on monitor.

## 2022-07-06 NOTE — Procedures (Signed)
Thoracentesis  Procedure Note  [name]  [id number]  07-09-1941  Date:07/06/22  Time:12:13 PM   Provider Performing:[place] Jerilynn Mages Verlee Monte   Procedure: Thoracentesis with imaging guidance (03709)  Indication(s) Pleural Effusion  Consent Risks of the procedure as well as the alternatives and risks of each were explained to the patient and/or caregiver.  Consent for the procedure was obtained and is signed in the bedside chart  Anesthesia Topical only with 1% lidocaine    Time Out Verified patient identification, verified procedure, site/side was marked, verified correct patient position, special equipment/implants available, medications/allergies/relevant history reviewed, required imaging and test results available.   Sterile Technique Maximal sterile technique including full sterile barrier drape, hand hygiene, sterile gown, sterile gloves, mask, hair covering, sterile ultrasound probe cover (if used).  Procedure Description Ultrasound was used to identify appropriate pleural anatomy for placement and overlying skin marked.  Area of drainage cleaned and draped in sterile fashion. Lidocaine was used to anesthetize the skin and subcutaneous tissue.  450 cc's of serosanguinous appearing fluid was drained from the left pleural space. Volume limited by chest tightness, suspect entrapped lung. Catheter then removed and bandaid applied to site.     Complications/Tolerance None; patient tolerated the procedure well. Chest X-ray is ordered to confirm no post-procedural complication.   EBL Minimal   Specimen(s) Pleural fluid

## 2022-07-06 NOTE — Progress Notes (Addendum)
NAME:  [name], MRN:  [id number], DOB:  Jul 03, 1941, LOS: 1 ADMISSION DATE:  07/15/2022, CONSULTATION DATE:  07/15/2022 REFERRING MD:  Reesa Chew - [facility] CHIEF COMPLAINT: Dyspnea, hypoxia   History of Present Illness:  81 year old woman with history of metastatic adenoCA lung EGFR+ on tagrisso, recurrent left MPE s/p PleurX and removal 11/2839, diastolic heart failure, COPD, MGUS, chronic hypoxic respiratory failure on 2L o2 who was in her USOH until about 5d PTA developed dyspnea, palpitations. In clinic with Dr. Julien Nordmann 12/5 mentioned low grade fever, weakness, cough with some sputum production. She needed increase in her O2 from 2L O2 to 5L O2 to maintain saturation, was observed to have HR in 140s in clinic and was recommended to present to ED.  Here found to be in AF/RVR started on dilt gtt, acute on chronic hypoxic respiratory failure with possibility of CAP and slight increase in bilateral pleural effusions with plan to assess response to diuretic. PCCM consulted for increased size of left pleural effusion.  Pertinent Medical History:  Metastatic adenocarcinoma Recurrent left MPE s/p PleurX and removal 09/2438 Diastolic heart failure COPD MGUS  Chronic hypoxic respiratory failure on 2L O2  Significant Hospital Events: Including procedures, antibiotic start and stop dates in addition to other pertinent events   07/08/2022 admitted, diuresing and working on rate control. S/p left thora with 412m removed. 12/8 Breathing subjectively improved, maintaining SpO2 on 4LNC. +Wheezing on exam.  Interim History / Subjective:  No significant events overnight Feeling subjectively better from a breathing standpoint Does feel tight/wheezy, audible on exam Maintaining sats Fair appetite  Objective:  Blood pressure (!) 135/52, pulse 92, temperature 98.1 F (36.7 C), temperature source Axillary, resp. rate (!) 21, weight 74.4 kg, SpO2 92 %.        Intake/Output Summary (Last 24 hours) at  07/06/2022 0809 Last data filed at 07/06/2022 0700 Gross per 24 hour  Intake 2618.6 ml  Output 150 ml  Net 2468.6 ml   Filed Weights   07/06/22 0500  Weight: 74.4 kg   Physical Examination: General: Chronically ill-appearing elderly woman in NAD. Resting comfortably in bed. HEENT: [place]/AT, anicteric sclera, PERRL, moist mucous membranes. Neuro: Awake, oriented x 4. Responds to verbal stimuli. Following commands consistently. Moves all 4 extremities spontaneously. Generalized weakness. CV: RRR, no m/g/r. PULM: Breathing even and minimally labored on 4LNC, mild tachypnea in low 20s. Lung fields with bilateral wheezing, L > R. GI: Soft, nontender, nondistended. Normoactive bowel sounds. Extremities: Bilateral symmetric 1+ LE edema noted with venous stasis changes. Skin: Warm/dry, bilateral LE skin darkening c/w venous stasis.  Resolved Hospital Problem list    Assessment & Plan:   # Acute on chronic hypoxic respiratory failure # Multifocal nodular consolidation RUL, RLL atelectasis vs consolidation # Increased burden of bilateral pleural effusions Per Dr. MVerlee Monte didn't seem like left thora made a huge difference in symptom relief and only tolerated 450cc fluid removal, suspect she has entrapment in setting chronic effusion. Recent CTA raises question of multifocal pneumonia vs pneumonitis vs progression of cancer (latter 2 seem less likely given pace). Does also look like her extravascular volume overload is worse in setting of AF/diastolic heart failure. - RVP, urine strep negative - F/u legionella, PCT 0.57 (0.29) - F/u BCx - Continue ceftriaxone/azithromycin, narrow as able - Diuresis as tolerated - Continue supplemental O2 support - DuoNebs PRN - Pulmonary hygiene, guaifenesin  # Goals of care  - Discussed code status with Ms. LHitch She is adamant that she would not  want to be on the ventilator at all. I recommended against CPR/shocks/ACLS if that was truly the case. She is now  DNR. - DNR code status  Best Practice (right click and "Reselect all SmartList Selections" daily)   Per [facility]  Critical care time: N/A   Rhae Lerner  Pulmonary & Critical Care 07/06/22 8:29 AM  Please see [email or web address] for pager details.  From 7A-7P if no response, please call [phone] After hours, please call ELink [phone]

## 2022-07-06 NOTE — Progress Notes (Signed)
PROGRESS NOTE    [name]  CXK:[id number] DOB: 27-Aug-1940 DOA: 07/24/2022 PCP: [name], MD   Brief Narrative:   81 y.o. female with medical history significant of stage IV adenocarcinoma of lung followed by Dr. Earlie Server on [place], HTN, GERD, COPD, diastolic CHF, MGUS, osteopenia, chronic hypoxia on 2 L nasal cannula comes to the hospital for shortness of breath.  Patient admitted to the hospital for atrial fibrillation with RVR, acute kidney injury, CHF exacerbation, pleural effusion/malignant recurrent effusion.  Pulmonary and cardiology teams were consulted.  Initially patient was started on Cardizem drip which was slowly transitioned to p.o.   Assessment & Plan:  Active Problems:   Adenocarcinoma of left lung, stage 4 (HCC)   HTN (hypertension)   CHF (congestive heart failure), NYHA class III, chronic, diastolic (HCC)   COPD (chronic obstructive pulmonary disease) (HCC)   GERD   Acute respiratory failure with hypoxia (HCC)   MGUS (monoclonal gammopathy of unknown significance)   Chronic kidney disease, stage 3b (HCC)   Atrial fibrillation with RVR (HCC)   Acute on chronic hypoxic respiratory distress -At baseline uses 2 L nasal cannula, currently on 5 L nasal cannula.  Likely from worsening effusion, atrial fibrillation with RVR, CHF exacerbation.  Management as below.  Recent CTA negative for PE but does have possible loculated effusion and recurrent malignant effusions. -Status post thoracentesis by pulmonary 12/7, 450 cc removed.  Possible mild infection versus malignant effusion.  Continue current antibiotics per pulmonary. - I-S/flutter valve, bronchodilators as needed.   Moderate left-sided pleural effusion - Status post thoracentesis by pulmonary.  Concerns of mild infection versus recurrent malignant effusion.  Continue Rocephin/azithromycin. -IV Lasix as necessary   Atrial fibrillation with RVR - Overnight had issues with hypotension.  Rate appears to be  better controlled.  Weaned off Cardizem.  Currently on metoprolol.  Cardiology team consulted.   Acute congestive heart failure with preserved ejection fraction, 70%.  Class IV - Exacerbated by atrial fibrillation with RVR.  Will need to get updated echocardiogram, cardiology team consulted.  Getting IV diuretics.   Acute kidney injury on CKD stage IIIb - Creatinine baseline 1.6, creatinine still slightly trended up to 2.3.   Stage IV nonsmall cell lung cancer - Follows with Dr. Earlie Server.  Currently on daily Tagrisso.  On hold per recent recommendation while hospitalized.   History of COPD - As needed bronchodilators   Anxiety - As needed Klonopin       DVT prophylaxis: SQ Hep Code Status: partial Family Communication: None at bedside  Status is: Inpatient Remains inpatient appropriate because: Maintain stepdown stay while patient's heart rate still remains uncontrolled requiring IV diuretics and may need to start Cardizem drip if necessary.   Nutritional status          Body mass index is 14.89 kg/m.  Pressure Injury 07/02/2022 Buttocks Bilateral Deep Tissue Pressure Injury - Purple or maroon localized area of discolored intact skin or blood-filled blister due to damage of underlying soft tissue from pressure and/or shear. Deep tissue injury with stage  (Active)  07/20/2022 1730  Location: Buttocks  Location Orientation: Bilateral  Staging: Deep Tissue Pressure Injury - Purple or maroon localized area of discolored intact skin or blood-filled blister due to damage of underlying soft tissue from pressure and/or shear.  Wound Description (Comments): Deep tissue injury with stage 2  Present on Admission: Yes        Subjective:  Patient seen and examined at bedside.  In the  morning she had episode of hypotension therefore Cardizem drip was turned off.  During my visit her heart rate was between 100-115 and irregular.  She stated her symptoms are much better and shortness  of breath had improved after thoracentesis and with better control of her heart rate.  Examination:  General exam: Appears calm and comfortable, 5 L nasal cannula.  Appears chronically ill Respiratory system: Bilateral rhonchi Cardiovascular system: Irregularly irregular tachycardia Gastrointestinal system: Abdomen is nondistended, soft and nontender. No organomegaly or masses felt. Normal bowel sounds heard. Central nervous system: Alert and oriented. No focal neurological deficits. Extremities: Symmetric 5 x 5 power. Skin: No rashes, lesions or ulcers Psychiatry: Judgement and insight appear normal. Mood & affect appropriate.     Objective: Vitals:   07/06/22 0713 07/06/22 0800 07/06/22 1000 07/06/22 1015  BP:  (!) 132/58 (!) 152/47   Pulse:  89 98   Resp:  (!) 21 20   Temp: 98.8 F (37.1 C)     TempSrc: Axillary     SpO2:  93% 95%   Weight:    40.6 kg  Height:    5' 5"  (1.651 m)    Intake/Output Summary (Last 24 hours) at 07/06/2022 1053 Last data filed at 07/06/2022 0803 Gross per 24 hour  Intake 2982 ml  Output 150 ml  Net 2832 ml   Filed Weights   07/06/22 0500 07/06/22 1015  Weight: 74.4 kg 40.6 kg     Data Reviewed:   CBC: Recent Labs  Lab 07/03/22 0931 07/27/2022 1230 07/06/22 0319  WBC 16.3* 14.4* 16.2*  NEUTROABS 13.9* 12.5*  --   HGB 9.3* 9.0* 8.5*  HCT 29.1* 29.6* 28.6*  MCV 82.9 85.3 87.5  PLT 262 248 606   Basic Metabolic Panel: Recent Labs  Lab 07/03/22 0931 07/29/2022 1230 07/06/22 0319  NA 136 135 131*  K 4.0 4.8 4.7  CL 96* 99 96*  CO2 31 28 23   GLUCOSE 189* 166* 132*  BUN 49* 56* 51*  CREATININE 1.67* 2.14* 2.35*  CALCIUM 10.4* 9.9 9.2  MG  --   --  2.3   GFR: Estimated Creatinine Clearance: 12 mL/min (A) (by C-G formula based on SCr of 2.35 mg/dL (H)). Liver Function Tests: Recent Labs  Lab 07/03/22 0931 07/15/2022 1811  AST 19  --   ALT 11  --   ALKPHOS 92  --   BILITOT 0.4  --   PROT 7.7 7.8  ALBUMIN 3.4*  --    No  results for input(s): "LIPASE", "AMYLASE" in the last 168 hours. No results for input(s): "AMMONIA" in the last 168 hours. Coagulation Profile: No results for input(s): "INR", "PROTIME" in the last 168 hours. Cardiac Enzymes: No results for input(s): "CKTOTAL", "CKMB", "CKMBINDEX", "TROPONINI" in the last 168 hours. BNP (last 3 results) No results for input(s): "PROBNP" in the last 8760 hours. HbA1C: No results for input(s): "HGBA1C" in the last 72 hours. CBG: No results for input(s): "GLUCAP" in the last 168 hours. Lipid Profile: No results for input(s): "CHOL", "HDL", "LDLCALC", "TRIG", "CHOLHDL", "LDLDIRECT" in the last 72 hours. Thyroid Function Tests: No results for input(s): "TSH", "T4TOTAL", "FREET4", "T3FREE", "THYROIDAB" in the last 72 hours. Anemia Panel: No results for input(s): "VITAMINB12", "FOLATE", "FERRITIN", "TIBC", "IRON", "RETICCTPCT" in the last 72 hours. Sepsis Labs: Recent Labs  Lab 07/03/22 1216 07/23/2022 1811 07/06/22 0319  PROCALCITON  --  0.29 0.57  LATICACIDVEN 0.9  --   --     Recent Results (from the  past 240 hour(s))  Culture, blood (routine x 2)     Status: None (Preliminary result)   Collection Time: 07/03/22 11:58 AM   Specimen: BLOOD RIGHT HAND  Result Value Ref Range Status   Specimen Description   Final    BLOOD RIGHT HAND Performed at [facility] [address] [place], [place] [address]    Special Requests   Final    BOTTLES DRAWN AEROBIC AND ANAEROBIC Blood Culture results may not be optimal due to an inadequate volume of blood received in culture bottles Performed at [facility] [address] [place], [place] [address]    Culture   Final    NO GROWTH 3 DAYS Performed at [facility], [place] [address], [place], [place] [address]    Report Status PENDING  Incomplete  Culture, blood (routine x 2)     Status: None (Preliminary result)   Collection Time: 07/03/22 12:03 PM   Specimen:  BLOOD  Result Value Ref Range Status   Specimen Description   Final    BLOOD LEFT ANTECUBITAL Performed at [facility] [address], [place], [place] [address]    Special Requests   Final    BOTTLES DRAWN AEROBIC AND ANAEROBIC Blood Culture results may not be optimal due to an inadequate volume of blood received in culture bottles Performed at [facility] [address], [place], [place] [address]    Culture   Final    NO GROWTH 3 DAYS Performed at [facility], [place] [address], [place], [place] [address]    Report Status PENDING  Incomplete  Resp Panel by RT-PCR (Flu A&B, Covid) Anterior Nasal Swab     Status: None   Collection Time: 07/03/22 12:23 PM   Specimen: Anterior Nasal Swab  Result Value Ref Range Status   SARS Coronavirus 2 by RT PCR NEGATIVE NEGATIVE Final    Comment: (NOTE) SARS-CoV-2 target nucleic acids are NOT DETECTED.  The SARS-CoV-2 RNA is generally detectable in upper respiratory specimens during the acute phase of infection. The lowest concentration of SARS-CoV-2 viral copies this assay can detect is 138 copies/mL. A negative result does not preclude SARS-Cov-2 infection and should not be used as the sole basis for treatment or other patient management decisions. A negative result may occur with  improper specimen collection/handling, submission of specimen other than nasopharyngeal swab, presence of viral mutation(s) within the areas targeted by this assay, and inadequate number of viral copies(<138 copies/mL). A negative result must be combined with clinical observations, patient history, and epidemiological information. The expected result is Negative.  Fact Sheet for Patients:  EntrepreneurPulse.com.au  Fact Sheet for Healthcare Providers:  IncredibleEmployment.be  This test is no t yet approved or cleared by the [place] FDA and  has been authorized for  detection and/or diagnosis of SARS-CoV-2 by FDA under an Emergency Use Authorization (EUA). This EUA will remain  in effect (meaning this test can be used) for the duration of the COVID-19 declaration under Section 564(b)(1) of the Act, 21 U.S.C.section 360bbb-3(b)(1), unless the authorization is terminated  or revoked sooner.       Influenza A by PCR NEGATIVE NEGATIVE Final   Influenza B by PCR NEGATIVE NEGATIVE Final    Comment: (NOTE) The Xpert Xpress SARS-CoV-2/FLU/RSV plus assay is intended as an aid in the diagnosis of influenza from Nasopharyngeal swab specimens and should not be used as a sole basis for treatment. Nasal washings  and aspirates are unacceptable for Xpert Xpress SARS-CoV-2/FLU/RSV testing.  Fact Sheet for Patients: EntrepreneurPulse.com.au  Fact Sheet for Healthcare Providers: IncredibleEmployment.be  This test is not yet approved or cleared by the [place] FDA and has been authorized for detection and/or diagnosis of SARS-CoV-2 by FDA under an Emergency Use Authorization (EUA). This EUA will remain in effect (meaning this test can be used) for the duration of the COVID-19 declaration under Section 564(b)(1) of the Act, 21 U.S.C. section 360bbb-3(b)(1), unless the authorization is terminated or revoked.  Performed at [facility], [place] [address] [place], [place] [address]   Pleural fluid culture w Gram Stain     Status: None (Preliminary result)   Collection Time: 07/08/2022  4:12 PM   Specimen: Pleural Fluid  Result Value Ref Range Status   Specimen Description   Final    PLEURAL Performed at [facility] [address], [place], [place] [address]    Special Requests   Final    NONE Performed at [facility], [place] [address] [place], [place] [address]    Gram Stain   Final    FEW WBC PRESENT,BOTH PMN AND MONONUCLEAR NO ORGANISMS SEEN    Culture    Final    NO GROWTH < 12 HOURS Performed at [facility], [place] [address] [place], [place] [address]    Report Status PENDING  Incomplete  Respiratory (~20 pathogens) panel by PCR     Status: None   Collection Time: 07/12/2022  4:53 PM   Specimen: Nasopharyngeal Swab; Respiratory  Result Value Ref Range Status   Adenovirus NOT DETECTED NOT DETECTED Final   Coronavirus 229E NOT DETECTED NOT DETECTED Final    Comment: (NOTE) The Coronavirus on the Respiratory Panel, DOES NOT test for the novel  Coronavirus (2019 nCoV)    Coronavirus HKU1 NOT DETECTED NOT DETECTED Final   Coronavirus NL63 NOT DETECTED NOT DETECTED Final   Coronavirus OC43 NOT DETECTED NOT DETECTED Final   Metapneumovirus NOT DETECTED NOT DETECTED Final   Rhinovirus / Enterovirus NOT DETECTED NOT DETECTED Final   Influenza A NOT DETECTED NOT DETECTED Final   Influenza B NOT DETECTED NOT DETECTED Final   Parainfluenza Virus 1 NOT DETECTED NOT DETECTED Final   Parainfluenza Virus 2 NOT DETECTED NOT DETECTED Final   Parainfluenza Virus 3 NOT DETECTED NOT DETECTED Final   Parainfluenza Virus 4 NOT DETECTED NOT DETECTED Final   Respiratory Syncytial Virus NOT DETECTED NOT DETECTED Final   Bordetella pertussis NOT DETECTED NOT DETECTED Final   Bordetella Parapertussis NOT DETECTED NOT DETECTED Final   Chlamydophila pneumoniae NOT DETECTED NOT DETECTED Final   Mycoplasma pneumoniae NOT DETECTED NOT DETECTED Final    Comment: Performed at [facility], [place]. [address], [place], [place] [address]         Radiology Studies: DG Chest Port 1 View  Result Date: 07/20/2022 CLINICAL DATA:  Status post left thoracentesis. EXAM: PORTABLE CHEST 1 VIEW COMPARISON:  Same day. FINDINGS: No pneumothorax is noted status post left-sided thoracentesis. Left pleural effusion is significantly smaller. IMPRESSION: No pneumothorax status post left thoracentesis. Electronically Signed   By: Marijo Conception M.D.   On: 06/29/2022 16:49    DG Chest Port 1 View  Result Date: 07/12/2022 CLINICAL DATA:  Shortness of breath EXAM: PORTABLE CHEST 1 VIEW COMPARISON:  Previous studies including the examination done on 07/03/2022 FINDINGS: Transverse diameter of heart is increased. Central pulmonary vessels are prominent.  Increased interstitial and alveolar markings are seen in parahilar regions and lower lung fields. Left hemidiaphragm is elevated. Small to moderate bilateral pleural effusions are seen, more so on the left side. There is interval increase in amount of left pleural effusion. There is no pneumothorax. IMPRESSION: Cardiomegaly. CHF. Small to moderate bilateral pleural effusions, more so on the left side with interval increase. Electronically Signed   By: Elmer Picker M.D.   On: 07/28/2022 13:18        Scheduled Meds:  Chlorhexidine Gluconate Cloth  6 each Topical Daily   ferrous sulfate  325 mg Oral BID WC   heparin injection (subcutaneous)  5,000 Units Subcutaneous Q8H   metoprolol tartrate  75 mg Oral BID   nystatin   Topical BID   Continuous Infusions:  azithromycin Stopped (07/25/2022 1947)   cefTRIAXone (ROCEPHIN)  IV Stopped (07/01/2022 1825)   diltiazem (CARDIZEM) infusion Stopped (07/06/22 0100)     LOS: 1 day   Time spent= 35 mins    Amiah Frohlich Arsenio Loader, MD [facility]  If 7PM-7AM, please contact night-coverage  07/06/2022, 10:53 AM

## 2022-07-06 NOTE — Consult Note (Cosign Needed Addendum)
Cardiology Consultation   [name] ID: [name] MRN: [id number]; DOB: 1940/11/10  Admit date: 07/07/2022 Date of Consult: 07/06/2022  PCP:  [name], MD   [facility] Providers Cardiologist:  Larae Grooms, MD  Electrophysiologist:  Thompson Grayer, MD     [name] Profile:   [name] is a 81 y.o. female with a hx of chronic diastolic heart failure, history of GI bleed, PAF (not on AC due to GI bleed), sinus bradycardia, HTN, chronic SOB, OSA, Stage IV adenocarcinoma of lung  who is being seen 07/06/2022 for Laurie evaluation of atrial fibrillation, CHF at Laurie request of Dr. Reesa Chew.  History of Present Illness:   [name] is an 81 year old female with above medical history who is followed by Dr. Irish Lack. Per chart review, [name] established care with cardiology in 2019 for treatment of diastolic heart failure. Echocardiogram on 08/09/2017 showed EF 60-65%, no regional wall motion abnormalities, grade II diastolic dysfunction.  She was treated with PO torsemide. Later, at her PCP office, she was found to be in atrial fibrillation on 10/17/2017. She was sent to [facility] ED for further evaluation and was seen by cardiology. She converted to NSR spontaneously, and was not started on [facility] due to her history of recurring GI bleeds. She was treated with metoprolol, but eventually developed symptomatic bradycardia. [name] was referred to EP for treatment of tachy-brady syndrome, EP recommended pacemaker but [name] declined.   Later echo on 05/29/2020 showed EF 60-65% with grade II diastolic dysfunction. Cardiac monitor in 01/2021 showed normal sinus rhythm with rare PACs and PVCs, brief runs of SVT associated with symptoms, no sustained arrhythmias. Most recent echo from 11/24/2021 showed EF 70-75%, no regional wall motion abnormalities, grade II diastolic dysfunction, moderate mitral valve regurgitation.   [name] was recently seen in Laurie ED on 12/5 after she was seen at  [facility]. There, she was found to have a HR in Laurie 140s. Complained of SOB, chills, chest pain, cough, weakness. EKG showed atrial fibrillation. CXR in Laurie ED showed chronic left>right pleural effusions. CT chest showed small bilateral pleural effusions with loculated components on Laurie left, pleural effusion substantially increased on right. There was increased rind-like pleural thickening at Laurie left lung apex, concerning for pleural malignancy. Also focal nodularity on Laurie right lateral pleural margin that could have been from pleural metastatic disease. Admission was recommended, but [name] refused and left from Laurie ED.   [name] again presented to Laurie ED on 12/7. She complained of increasing dyspnea on exertion. Denied chest pain. EKG in Laurie ED showed atrial fibrillation with RVR, HR 145 BPM. CXR showed cardiomegaly with small to moderate bilateral pleural effusions, more so on Laurie left side with interval increase. [name] underwent left sided thoracentesis and only tolerated having 450 cc fluid removed.  [name] initially started on IV cardizem for rate control, but this was discontinued overnight due to hypotensive episodes. Cardiology asked to consult for afib and CHF management.    Past Medical History:  Diagnosis Date   Allergy    Angiodysplasia of cecum    Angiodysplasia of duodenum    Arthritis    Asteatotic eczema 02/25/2016   Asthma    Atrial fibrillation ([place]) 10/17/2017   Blood transfusion without reported diagnosis    Cataract    CHF (congestive heart failure), NYHA class III, chronic, combined ([place]) 08/10/2017   Colonic polyp 02/16/2008   Tubular adenoma   COPD (chronic obstructive pulmonary disease) ([place])  01/27/1998   Cor pulmonale ([place]) 11/16/2014   GERD 07/30/1992   Gout    HTN (hypertension) 07/30/1988   Hyperlipidemia with target LDL less than 100 11/27/1996        Kidney stone 1960, 1972, 1991   lung ca 04/2021   Medical history non-contributory    MGUS  (monoclonal gammopathy of unknown significance) 11/08/2016   Morbid obesity ([place]) 04/19/2010   She agrees to work on her lifestyle modifications to help her lose weight.    OSA (obstructive sleep apnea) 06/22/2013   Osteopenia, senile 02/05/2013   July 2014  -2.1 left femur -1.9 left forearm    Prediabetes 11/13/2006   Renal insufficiency    Stenosis of cervical spine with myelopathy ([place]) 01/01/2018   Supplemental oxygen dependent    2L via [place]   Ulcer of leg, chronic, right ([place]) 10/10/2011   ULCERATIVE COLITIS-LEFT SIDE 03/19/2008        Vitamin B12 deficiency anemia 11/13/2006        Vitamin D deficiency 02/06/2013    Past Surgical History:  Procedure Laterality Date   BRONCHOSCOPY     CHEST TUBE INSERTION Left 06/15/2021   Procedure: INSERTION PLEURAL DRAINAGE CATHETER;  Surgeon: [name], MD;  Location: [place];  Service: Thoracic;  Laterality: Left;   COLONOSCOPY     COLONOSCOPY WITH PROPOFOL N/A 02/23/2015   Procedure: COLONOSCOPY WITH PROPOFOL;  Surgeon: Ladene Artist, MD;  Location: [facility];  Service: Endoscopy;  Laterality: N/A;   COLONOSCOPY WITH PROPOFOL N/A 08/12/2018   Procedure: COLONOSCOPY WITH PROPOFOL;  Surgeon: Ladene Artist, MD;  [facility];  Service: Endoscopy;  Laterality: N/A;   COLONOSCOPY WITH PROPOFOL N/A 05/09/2020   Procedure: COLONOSCOPY WITH PROPOFOL;  Surgeon: Ladene Artist, MD;  [facility];  Service: Endoscopy;  Laterality: N/A;  To Splenic Flexture   COLONOSCOPY WITH PROPOFOL N/A 11/23/2021   Procedure: COLONOSCOPY WITH PROPOFOL;  Surgeon: Mauri Pole, MD;  [facility];  Service: Gastroenterology;  Laterality: N/A;   ESOPHAGOGASTRODUODENOSCOPY N/A 11/23/2021   Procedure: ESOPHAGOGASTRODUODENOSCOPY (EGD);  Surgeon: Mauri Pole, MD;  Location: [facility];  Service: Gastroenterology;  Laterality: N/A;   ESOPHAGOGASTRODUODENOSCOPY (EGD) WITH PROPOFOL N/A 02/23/2015   Procedure:  ESOPHAGOGASTRODUODENOSCOPY (EGD) WITH PROPOFOL;  Surgeon: Ladene Artist, MD;  [facility];  Service: Endoscopy;  Laterality: N/A;   ESOPHAGOGASTRODUODENOSCOPY (EGD) WITH PROPOFOL N/A 08/12/2018   Procedure: ESOPHAGOGASTRODUODENOSCOPY (EGD) WITH PROPOFOL;  Surgeon: Ladene Artist, MD;  [facility];  Service: Endoscopy;  Laterality: N/A;   ESOPHAGOGASTRODUODENOSCOPY (EGD) WITH PROPOFOL N/A 05/08/2020   Procedure: ESOPHAGOGASTRODUODENOSCOPY (EGD) WITH PROPOFOL;  Surgeon: Yetta Flock, MD;  [facility];  Service: Gastroenterology;  Laterality: N/A;   HEMOSTASIS CLIP PLACEMENT  11/23/2021   Procedure: HEMOSTASIS CLIP PLACEMENT;  Surgeon: Mauri Pole, MD;  [facility];  Service: Gastroenterology;;   HOT HEMOSTASIS N/A 08/12/2018   Procedure: HOT HEMOSTASIS (ARGON PLASMA COAGULATION/BICAP);  Surgeon: Ladene Artist, MD;  Location: [facility];  Service: Endoscopy;  Laterality: N/A;  EGD and Colon APC   HOT HEMOSTASIS N/A 05/08/2020   Procedure: HOT HEMOSTASIS (ARGON PLASMA COAGULATION/BICAP);  Surgeon: Yetta Flock, MD;  Location: [facility];  Service: Gastroenterology;  Laterality: N/A;   HOT HEMOSTASIS N/A 11/23/2021   Procedure: HOT HEMOSTASIS (ARGON PLASMA COAGULATION/BICAP);  Surgeon: Mauri Pole, MD;  Location: [facility];  Service: Gastroenterology;  Laterality: N/A;   LAPAROTOMY N/A 08/11/2020   Procedure: EXPLORATORY LAPAROTOMY PARTIAL COLECTOMY  AND COLOSTOMY WITH RESECTION OF A MESSENTERIC MASS;  Surgeon: Coralie Keens, MD;  [facility]: [facility];  Service: General;  Laterality: N/A;   POLYPECTOMY     REMOVAL OF PLEURAL DRAINAGE CATHETER N/A 10/17/2021   Procedure: MINOR REMOVAL OF PLEURAL DRAINAGE CATHETER;  Surgeon: [name], MD;  Location: [place];  Service: Cardiopulmonary;  Laterality: N/A;     Home Medications:  Prior to Admission medications   Medication Sig Start Date End Date Taking?  Authorizing Provider  acetaminophen (TYLENOL) 325 MG tablet Take 325 mg by mouth daily as needed for moderate pain, fever or headache.   Yes [provider]  albuterol (PROAIR HFA) 108 (90 Base) MCG/ACT inhaler Inhale 1-2 puffs into Laurie lungs every 6 (six) hours as needed for wheezing or shortness of breath. [name] taking differently: Inhale 1-2 puffs into Laurie lungs every 4 (four) hours as needed for wheezing or shortness of breath (or coughing). 10/30/16  Yes [name], MD  allopurinol (ZYLOPRIM) 100 MG tablet Take 1 tablet (100 mg total) by mouth daily. 02/06/22  Yes [name], MD  BREO ELLIPTA 100-25 MCG/ACT AEPB INHALE 1 PUFF INTO Laurie LUNGS DAILY [name] taking differently: Inhale 1 puff into Laurie lungs daily. 02/24/22  Yes [name], MD  clonazePAM (KLONOPIN) 1 MG tablet Take 1 tablet (1 mg total) by mouth 2 (two) times daily as needed for anxiety. [name] taking differently: Take 0.5 mg by mouth daily as needed for anxiety. 06/06/21  Yes [name], MD  Ensure (ENSURE) Take 237 mLs by mouth daily.   Yes [provider]  ferrous sulfate 325 (65 FE) MG tablet Take 1 tablet (325 mg total) by mouth 2 (two) times daily with a meal. 11/13/19  Yes [name], MD  isosorbide-hydrALAZINE (BIDIL) 20-37.5 MG tablet TAKE 1 TABLET BY MOUTH THREE TIMES DAILY [name] taking differently: Take 1 tablet by mouth in Laurie morning and at bedtime. 06/26/22  Yes [name], MD  metoprolol tartrate (LOPRESSOR) 25 MG tablet TAKE 1 TABLET(25 MG) BY MOUTH TWICE DAILY [name] taking differently: Take 25 mg by mouth in Laurie morning and at bedtime. 05/24/22  Yes [name], MD  miconazole (ATHLETES FOOT) 2 % powder Apply 1 Application topically as needed for itching (affected sites).   Yes [provider]  Nutritional Supplements (CARNATION BREAKFAST ESSENTIALS) LIQD Take 237 mLs by mouth daily as needed (nutrition support).   Yes [provider]   osimertinib mesylate (TAGRISSO) 80 MG tablet Take 1 tablet (80 mg total) by mouth daily. 06/11/22  Yes Curt Bears, MD  OXYGEN Inhale 2 L/min into Laurie lungs continuous.   Yes [provider]  prochlorperazine (COMPAZINE) 10 MG tablet Take 1 tablet (10 mg total) by mouth every 6 (six) hours as needed. [name] taking differently: Take 10 mg by mouth as needed for nausea or vomiting. 06/08/21  Yes Heilingoetter, Cassandra L, PA-C  torsemide (DEMADEX) 20 MG tablet TAKE 2 TABLETS(40 MG) BY MOUTH DAILY [name] taking differently: Take 40 mg by mouth in Laurie morning. 05/28/22  Yes [name], MD  cyanocobalamin 2000 MCG tablet Take 1 tablet (2,000 mcg total) by mouth daily. [name] not taking: Reported on 07/03/2022 12/06/21   [name], MD  nystatin (MYCOSTATIN/NYSTOP) powder Apply 1 application. topically 3 (three) times daily. [name] not taking: Reported on 07/03/2022 10/24/21   Barrett, Lodema Hong, PA-C    Inpatient Medications: Scheduled Meds:  Chlorhexidine Gluconate Cloth  6 each  Topical Daily   ferrous sulfate  325 mg Oral BID WC   heparin injection (subcutaneous)  5,000 Units Subcutaneous Q8H   metoprolol tartrate  75 mg Oral BID   nystatin   Topical BID   Continuous Infusions:  azithromycin Stopped (07/28/2022 1947)   cefTRIAXone (ROCEPHIN)  IV Stopped (07/13/2022 1825)   diltiazem (CARDIZEM) infusion Stopped (07/06/22 0100)   PRN Meds: acetaminophen, clonazePAM, guaiFENesin, ipratropium-albuterol, metoprolol tartrate, ondansetron (ZOFRAN) IV, oxyCODONE, senna-docusate, traZODone  Allergies:    Allergies  Allergen Reactions   Celebrex [Celecoxib] Swelling and Other (See Comments)    Ankles swell   Amlodipine Besylate Swelling and Other (See Comments)    Ankles swell   Enalapril Cough   Lipitor [Atorvastatin] Other (See Comments)    Muscle aches   Trelegy Ellipta [Fluticasone-Umeclidin-Vilant] Other (See Comments)    Made Laurie throat and esophagus "hurt" Uses  Breo (fluticasone - vilanterol) on a regular basis   Amoxicillin Diarrhea    DID Laurie REACTION INVOLVE: Swelling of Laurie face/tongue/throat, SOB, or low BP? N Sudden or severe rash/hives, skin peeling, or Laurie inside of Laurie mouth or nose? N Did it require medical treatment? N When did it last happen?      MORE THAN 10 YEARS If all above answers are "NO", may proceed with cephalosporin use.    Codeine Sulfate Nausea And Vomiting   Hydrocodone-Acetaminophen Nausea And Vomiting   Tape Itching, Rash and Other (See Comments)    Redness, also    Social History:   Social History   Socioeconomic History   Marital status: Married    Spouse name: Not on file   Number of children: 3   Years of education: Not on file   Highest education level: Not on file  Occupational History   Occupation: Surveyor, quantity: [employer]  Tobacco Use   Smoking status: Former    Types: Cigarettes    Quit date: 06/01/1996    Years since quitting: 26.1   Smokeless tobacco: Never   Tobacco comments:    Quit 1998  Vaping Use   Vaping Use: Never used  Substance and Sexual Activity   Alcohol use: No   Drug use: No   Sexual activity: Not Currently    Birth control/protection: Post-menopausal  Other Topics Concern   Not on file  Social History Narrative   Daily Caffeine Use:  4   Regular Exercise -  NO      Social Determinants of Health   Financial Resource Strain: High Risk (06/27/2020)   Overall Financial Resource Strain (CARDIA)    Difficulty of Paying Living Expenses: Hard  Food Insecurity: No Food Insecurity (07/08/2022)   Hunger Vital Sign    Worried About Running Out of Food in Laurie Last Year: Never true    Ran Out of Food in Laurie Last Year: Never true  Transportation Needs: No Transportation Needs (07/22/2022)   PRAPARE - Hydrologist (Medical): No    Lack of Transportation (Non-Medical): No  Physical Activity: Not on file  Stress: Not on file  Social Connections: Not  on file  Intimate Partner Violence: Not At Risk (07/19/2022)   Humiliation, Afraid, Rape, and Kick questionnaire    Fear of Current or Ex-Partner: No    Emotionally Abused: No    Physically Abused: No    Sexually Abused: No    Family History:    Family History  Problem Relation Age of Onset   Stroke Mother  Diabetes Mother    Kidney cancer Mother    Stomach cancer Mother    Heart disease Mother    Stroke Father    Heart disease Brother    Heart disease Brother    Crohn's disease Brother    Heart disease Sister        CATH,STENT   Clotting disorder Sister    Cervical cancer Sister    Lung cancer Sister    Heart attack Sister    Diabetes Sister    Colon cancer Neg Hx    Esophageal cancer Neg Hx    Rectal cancer Neg Hx      ROS:  Please see Laurie history of present illness.   All other ROS reviewed and negative.     Physical Exam/Data:   Vitals:   07/06/22 0713 07/06/22 0800 07/06/22 1000 07/06/22 1015  BP:  (!) 132/58 (!) 152/47   Pulse:  89 98   Resp:  (!) 21 20   Temp: 98.8 F (37.1 C)     TempSrc: Axillary     SpO2:  93% 95%   Weight:    40.6 kg  Height:    5' 5"  (1.651 m)    Intake/Output Summary (Last 24 hours) at 07/06/2022 1103 Last data filed at 07/06/2022 0803 Gross per 24 hour  Intake 2982 ml  Output 150 ml  Net 2832 ml      07/06/2022   10:15 AM 07/06/2022    5:00 AM  Last 3 Weights  Weight (lbs) 89 lb 8.1 oz 164 lb 0.4 oz  Weight (kg) 40.6 kg 74.4 kg     Body mass index is 14.89 kg/m.  General:  Elderly female, sitting in Laurie bed. Chronically ill appearing  HEENT: normal Neck: no JVD Vascular: Radial pulses 2+ bilaterally Cardiac:  normal S1, S2; irregular rate and rhythm  Lungs:  Expiratory wheezes. Diminished breath sounds at lung bases   Abd: soft, mildly tender to palpation throughout. Non-distended  Ext: 1+ edema in BLE Musculoskeletal:  No deformities, BUE and BLE strength normal and equal Skin: warm and dry  Neuro:  CNs 2-12  intact, no focal abnormalities noted Psych:  Normal affect   EKG:  Laurie EKG was personally reviewed and demonstrates:  EKG in Laurie ED showed atrial fibrillation with RVR, HR 145 BPM Telemetry:  Telemetry was personally reviewed and demonstrates:  Atrial fibrillation, HR in Laurie 70s-100s  Relevant CV Studies:   Laboratory Data:  High Sensitivity Troponin:  No results for input(s): "TROPONINIHS" in Laurie last 720 hours.   Chemistry Recent Labs  Lab 07/03/22 0931 07/18/2022 1230 07/06/22 0319  NA 136 135 131*  K 4.0 4.8 4.7  CL 96* 99 96*  CO2 31 28 23   GLUCOSE 189* 166* 132*  BUN 49* 56* 51*  CREATININE 1.67* 2.14* 2.35*  CALCIUM 10.4* 9.9 9.2  MG  --   --  2.3  GFRNONAA 31* 23* 20*  ANIONGAP 9 8 12     Recent Labs  Lab 07/03/22 0931 07/14/2022 1811  PROT 7.7 7.8  ALBUMIN 3.4*  --   AST 19  --   ALT 11  --   ALKPHOS 92  --   BILITOT 0.4  --    Lipids No results for input(s): "CHOL", "TRIG", "HDL", "LABVLDL", "LDLCALC", "CHOLHDL" in Laurie last 168 hours.  Hematology Recent Labs  Lab 07/03/22 0931 07/22/2022 1230 07/06/22 0319  WBC 16.3* 14.4* 16.2*  RBC 3.51* 3.47* 3.27*  HGB 9.3* 9.0* 8.5*  HCT 29.1* 29.6* 28.6*  MCV 82.9 85.3 87.5  MCH 26.5 25.9* 26.0  MCHC 32.0 30.4 29.7*  RDW 14.1 14.5 14.4  PLT 262 248 246   Thyroid No results for input(s): "TSH", "FREET4" in Laurie last 168 hours.  BNP Recent Labs  Lab 07/03/22 1158 07/26/2022 1230  BNP 238.4* 307.6*    DDimer No results for input(s): "DDIMER" in Laurie last 168 hours.   Radiology/Studies:  DG Chest Port 1 View  Result Date: 07/19/2022 CLINICAL DATA:  Status post left thoracentesis. EXAM: PORTABLE CHEST 1 VIEW COMPARISON:  Same day. FINDINGS: No pneumothorax is noted status post left-sided thoracentesis. Left pleural effusion is significantly smaller. IMPRESSION: No pneumothorax status post left thoracentesis. Electronically Signed   By: Marijo Conception M.D.   On: 07/01/2022 16:49   DG Chest Port 1  View  Result Date: 06/29/2022 CLINICAL DATA:  Shortness of breath EXAM: PORTABLE CHEST 1 VIEW COMPARISON:  Previous studies including Laurie examination done on 07/03/2022 FINDINGS: Transverse diameter of heart is increased. Central pulmonary vessels are prominent. Increased interstitial and alveolar markings are seen in parahilar regions and lower lung fields. Left hemidiaphragm is elevated. Small to moderate bilateral pleural effusions are seen, more so on Laurie left side. There is interval increase in amount of left pleural effusion. There is no pneumothorax. IMPRESSION: Cardiomegaly. CHF. Small to moderate bilateral pleural effusions, more so on Laurie left side with interval increase. Electronically Signed   By: Elmer Picker M.D.   On: 07/26/2022 13:18   CT Angio Chest PE W and/or Wo Contrast  Result Date: 07/03/2022 CLINICAL DATA:  Stage IV lung cancer. Tachycardia [facility]. Shortness of breath with chills and cough. Chest pain weakness. * Tracking Code: BO * EXAM: CT ANGIOGRAPHY CHEST WITH CONTRAST TECHNIQUE: Multidetector CT imaging of Laurie chest was performed using Laurie pulmonary embolus protocol during bolus administration of intravenous contrast. Multiplanar CT image reconstructions and MIPs were obtained to evaluate Laurie vascular anatomy. RADIATION DOSE REDUCTION: This exam was performed according to Laurie departmental dose-optimization program which includes automated exposure control, adjustment of the mA and/or kV according to [name] size and/or use of iterative reconstruction technique. CONTRAST:  34m OMNIPAQUE IOHEXOL 350 MG/ML SOLN COMPARISON:  04/24/2022 FINDINGS: Cardiovascular: No filling defect is identified in Laurie pulmonary arterial tree to suggest pulmonary embolus. Dilute contrast medium is present in Laurie thoracic aorta, without specific findings of acute aortic abnormality. Extensive coronary and aortic atherosclerosis and branch vessel atherosclerosis. Cardiomegaly is present.  Mediastinum/Nodes: AP window lymph node 1.0 cm in short axis on image 58 series 4, previously 0.7 cm. Precarinal lymph node 1.2 cm in short axis, previously 0.8 cm. Other thoracic lymph nodes appear mildly abnormally enlarged and mildly increased from previous. Subcarinal node 2.2 cm in short axis on image 79 series 4, formerly 1.6 cm. Increased right infrahilar adenopathy. Lungs/Pleura: Small bilateral pleural effusions with loculated components on Laurie left. Increased rind like pleural thickening Laurie left lung apex for example on image 46 of series 4, concerning for pleural malignancy. Likewise malignant pleural effusion cannot be excluded on Laurie right, and there is some focal nodularity along Laurie right lateral pleural margin. Progressive atelectasis in Laurie left lower lobe company by Laurie pleural effusion. Bilateral airway thickening. Secondary pulmonary lobular septal thickening at Laurie lung apices favoring interstitial edema with patchy ground-glass densities in both lungs likewise a likely manifestation of edema. Increased atelectasis in Laurie right lower lobe associated with Laurie increasing right pleural effusion. Patchy confluent  regions of airspace opacity in Laurie right perihilar region, generally favoring edema. Laurie prior faint ground-glass density nodule seen in Laurie right upper lobe on Laurie prior exam is completely obscured by surrounding edema and other ground-glass opacities and indistinct nodular opacities in Laurie right lung. Upper Abdomen: Nonspecific hypodense right kidney upper pole lesions, probably cysts. [name] had a left renal mass along Laurie lower pole on 08/14/2021, this vertical level was not included on today's chest CT. Musculoskeletal: Stable inferior endplate compression at T12. Review of Laurie MIP images confirms Laurie above findings. IMPRESSION: 1. No filling defect is identified in Laurie pulmonary arterial tree to suggest pulmonary embolus. 2. Small bilateral pleural effusions with loculated  components on Laurie left. Laurie pleural effusion is substantially increased on Laurie right side. Increased rind like pleural thickening at Laurie left lung apex, concerning for pleural malignancy. Likewise, there is some focal nodularity along Laurie right lateral pleural margin which could be from pleural metastatic disease. 3. Increased mediastinal and right infrahilar adenopathy, concerning for malignancy. 4. Cardiomegaly interstitial edema with patchy ground-glass opacities in both lungs, favoring pulmonary edema. There is also some overt airspace opacity in Laurie right lung likely from edema, less likely pneumonia. 5. Laurie prior faint ground-glass density nodule in Laurie right upper lobe is completely obscured by surrounding edema and other indistinct ground-glass opacities in Laurie right lung. 6. [name] had an enhancing left renal lower pole mass on 08/14/2021, this vertical level was not included on today's chest CT. 7. Extensive coronary and aortic atherosclerosis. 8. Stable inferior endplate compression at T12. Aortic Atherosclerosis (ICD10-I70.0). Electronically Signed   By: Van Clines M.D.   On: 07/03/2022 15:22   DG Chest Portable 1 View  Result Date: 07/03/2022 CLINICAL DATA:  81 year old female with lung cancer. Tachycardia, shortness of breath, cough, chest pain, weakness. EXAM: PORTABLE CHEST 1 VIEW COMPARISON:  Restaging CT Chest, Abdomen, and Pelvis 04/24/2022, and earlier. FINDINGS: Portable AP semi upright view at 1027 hours. Moderate left and small right pleural effusions both seem mildly increased since Laurie CT in September. Other mediastinal contours appear stable. No pneumothorax. No air bronchograms. Chronic increased interstitial markings appear stable. Calcified aortic atherosclerosis. Visualized tracheal air column is within normal limits. Stable visualized osseous structures. Paucity of bowel gas in Laurie visible abdomen. IMPRESSION: 1. Chronic left > right pleural effusions seem mildly  increased since September restaging CTs. 2. No other acute cardiopulmonary abnormality identified. Electronically Signed   By: Genevie Ann M.D.   On: 07/03/2022 11:08     Assessment and Plan:   Paroxysmal atrial fibrillation  - [name] has a known history of atrial fibrillation, although she has not had recurrence for several years - She is not on [facility] as she has a history of recurrent GI bleeds. Today, her hemoglobin is 8.5, so we will not start AC at this time.  - [name] presented in afib with RVR and was started on IV diltiazem in Laurie ED. However, this was continued overnight due to episodes of hypotension  - Now, [name] is on PO metoprolol 50 mg BID. Per telemetry, HR is in Laurie 70s-100s - Increase metoprolol to 75 mg BID. BP has improved to 132/58 this AM - Hold home Bi-Dil for now to allow more BP room for titration of BB  - [name] is not a candidate for AADs or cardioversion as she cannot be on [facility]   Acute on Chronic Diastolic Heart Failure  - Most recent echo from 11/24/2021 showed EF  70-75%, no regional wall motion abnormalities, moderate LVH, grade II diastolic dysfunction  - [name] presented with SOB. CXR showed small to moderate bilateral pleural effusions, more so on Laurie left side with interval increase  - BNP 307 - [name] was given a dose of IV lasix 40 mg yesterday-- creatinine bumped slightly from 2.14>2.35, but this could be prerenal in Laurie setting of afib with RVR, hypotension overnight. Does not appear urine output is properly recorded, but she received 2.3 L IV fluids yesterday.   [name] does have ankle edema on exam, decreased breath sounds in lung bases  - Give an additional dose of IV lasix today. Check BMP in Laurie AM  - Strict I/Os, daily weights   Pleural Effusions  - [name] had a left thoracentesis yesterday that yielded 450 cc fluid.  - She does report a slight improvement in breathing, but continues to have some SOB  - Lasix as above   Otherwise per primary  -  Stage IV nonsmall cell lung cancer  - COPD  - Anxiety   Risk Assessment/Risk Scores:    New York Heart Association (NYHA) Functional Class NYHA Class III  CHA2DS2-VASc Score = 5   This indicates a 7.2% annual risk of stroke. [name]'s score is based upon: CHF History: 1 HTN History: 1 Diabetes History: 0 Stroke History: 0 Vascular Disease History: 0 Age Score: 2 Gender Score: 1    For questions or updates, please contact [place] Please consult [email or web address] for contact info under    Signed, Margie Billet, PA-C  07/06/2022 11:03 AM

## 2022-07-06 NOTE — TOC Initial Note (Signed)
Transition of Care [facility]) - Initial/Assessment Note    Patient Details  Name: [name] MRN: [id number] Date of Birth: 08-29-1940  Transition of Care [facility]) CM/SW Contact:    Leeroy Cha, RN Phone Number: 07/06/2022, 8:21 AM  Clinical Narrative:                  Transition of Care [facility]) Screening Note   Patient Details  Name: [name] Date of Birth: May 31, 1941   Transition of Care [facility]) CM/SW Contact:    Leeroy Cha, RN Phone Number: 07/06/2022, 8:21 AM    Transition of Care Department [facility]) has reviewed patient and no TOC needs have been identified at this time. We will continue to monitor patient advancement through interdisciplinary progression rounds. If new patient transition needs arise, please place a TOC consult.    Expected Discharge Plan: Home/Self Care Barriers to Discharge: Continued Medical Work up   Patient Goals and CMS Choice Patient states their goals for this hospitalization and ongoing recovery are:: to go home CMS Medicare.gov Compare Post Acute Care list provided to:: Patient Choice offered to / list presented to : Patient  Expected Discharge Plan and Services Expected Discharge Plan: Home/Self Care   Discharge Planning Services: CM Consult   Living arrangements for the past 2 months: Single Family Home                                      Prior Living Arrangements/Services Living arrangements for the past 2 months: Single Family Home Lives with:: Spouse Patient language and need for interpreter reviewed:: Yes Do you feel safe going back to the place where you live?: Yes            Criminal Activity/Legal Involvement Pertinent to Current Situation/Hospitalization: No - Comment as needed  Activities of Daily Living Home Assistive Devices/Equipment: Walker (specify type) ADL Screening (condition at time of admission) Patient's cognitive ability adequate to safely complete daily activities?: Yes Is the  patient deaf or have difficulty hearing?: No Does the patient have difficulty seeing, even when wearing glasses/contacts?: No Does the patient have difficulty concentrating, remembering, or making decisions?: No Patient able to express need for assistance with ADLs?: Yes Does the patient have difficulty dressing or bathing?: Yes Independently performs ADLs?: Yes (appropriate for developmental age) Does the patient have difficulty walking or climbing stairs?: Yes Weakness of Legs: Both Weakness of Arms/Hands: None  Permission Sought/Granted                  Emotional Assessment Appearance:: Appears stated age Attitude/Demeanor/Rapport: Engaged Affect (typically observed): Calm Orientation: : Oriented to Self, Oriented to  Time Alcohol / Substance Use: Tobacco Use (former user) Psych Involvement: No (comment)  Admission diagnosis:  Atrial fibrillation with RVR ([place]) [I48.91] Dyspnea, unspecified type [R06.00] Patient Active Problem List   Diagnosis Date Noted   Iron deficiency anemia due to chronic blood loss 02/21/2022   Encounter for general adult medical examination with abnormal findings 02/06/2022   Atrial fibrillation with RVR ([place]) 02/06/2022   Pain due to onychomycosis of toenails of both feet 12/29/2021   Goals of care, counseling/discussion 11/23/2021   Encounter for therapeutic drug monitoring 08/16/2021   Adenocarcinoma of left lung, stage 4 ([place]) 06/08/2021   Metastatic adenocarcinoma ([place]) 05/21/2021   Recurrent pleural effusion 05/05/2021   Irreducible ventral hernia 08/09/2020   COPD (chronic obstructive pulmonary disease) (  [place]) 08/09/2020   Chronic respiratory failure with hypoxia ([place]) 02/06/2020   AVM (arteriovenous malformation) of colon with hemorrhage 08/14/2018   AVM (arteriovenous malformation) of small bowel, acquired with hemorrhage 08/14/2018   Stenosis of cervical spine with myelopathy ([place]) 01/01/2018   CHF (congestive heart failure), NYHA class  III, chronic, diastolic ([place]) 32/91/9166   Angiodysplasia of cecum    Angiodysplasia of duodenum    Grade III diastolic dysfunction 06/00/4599   MGUS (monoclonal gammopathy of unknown significance) 11/08/2016   Asteatotic eczema 02/25/2016   COLD (chronic obstructive lung disease) ([place])    Acute respiratory failure with hypoxia ([place]) 11/16/2014   Anemia, iron deficiency 11/16/2014   Cor pulmonale ([place]) 11/16/2014   OSA (obstructive sleep apnea) 06/22/2013   Chronic venous stasis dermatitis of both lower extremities 06/02/2013   Vitamin D deficiency 02/06/2013   Osteopenia, senile 02/05/2013   Other screening mammogram 02/05/2013   Chronic kidney disease, stage 3b ([place]) 06/20/2010   Obesity (BMI 30-39.9) 04/19/2010   Morbid obesity ([place]) 04/19/2010   Gout 01/10/2009   ULCERATIVE COLITIS-LEFT SIDE 03/19/2008   Vitamin B12 deficiency anemia 11/13/2006   Hyperlipidemia with target LDL less than 100 11/27/1996   GERD 07/30/1992   HTN (hypertension) 07/30/1988   PCP:  [name], MD Pharmacy:   [facility] [place], [place] - [address] AT [facility] OF [address] [place] [address] Phone: [phone] Fax: [phone]     Social Determinants of Health (SDOH) Interventions    Readmission Risk Interventions   Row Labels 11/22/2021    4:25 PM 10/11/2021   10:30 AM  Readmission Risk Prevention Plan   Section Header. No data exists in this row.    Transportation Screening   Complete Complete  PCP or Specialist Appt within 3-5 Days   Complete Complete  HRI or [place]   Complete Complete  Social Work Consult for [place] Planning/Counseling   Complete Complete  Palliative Care Screening   Complete Complete  Medication Review Press photographer)    Complete

## 2022-07-06 NOTE — Progress Notes (Signed)
       CROSS COVER NOTE  NAME: [name] MRN: [id number] DOB : 01-20-1941    Date of Service   07/06/2022   HPI/Events of Note   Cardizem drip had to be stopped due to hypotensive episodees, SBP 70-80s and MAP of 50s.  During daytime patient was on Cardizem drip and also received Lasix, bidil, and Lopressor.  Patient has a history of diastolic heart failure with previous EF of 70 -75%. A total of 750 cc IVF was administered slowly for hypotension.  0500- BP improvement, MAP > 65.  Patient remains alert and oriented x 4.  During bedside assessment, she mentions that during office visits, it has been brought to her attention that her " bottom BP number is low."  She also endorses lightheadedness and palpitations as she remains in atrial fibrillation with rate control 60-80 BPM.   Interventions/ Plan   IVF bolus       Raenette Rover, DNP, [place]

## 2022-07-07 ENCOUNTER — Inpatient Hospital Stay (HOSPITAL_COMMUNITY): Payer: Medicare Other

## 2022-07-07 DIAGNOSIS — I480 Paroxysmal atrial fibrillation: Secondary | ICD-10-CM

## 2022-07-07 DIAGNOSIS — I5033 Acute on chronic diastolic (congestive) heart failure: Secondary | ICD-10-CM | POA: Diagnosis not present

## 2022-07-07 DIAGNOSIS — I639 Cerebral infarction, unspecified: Secondary | ICD-10-CM

## 2022-07-07 DIAGNOSIS — I6310 Cerebral infarction due to embolism of unspecified precerebral artery: Secondary | ICD-10-CM

## 2022-07-07 DIAGNOSIS — I5043 Acute on chronic combined systolic (congestive) and diastolic (congestive) heart failure: Secondary | ICD-10-CM

## 2022-07-07 DIAGNOSIS — J9621 Acute and chronic respiratory failure with hypoxia: Secondary | ICD-10-CM

## 2022-07-07 LAB — CBC
HCT: 26.5 % — ABNORMAL LOW (ref 36.0–46.0)
Hemoglobin: 7.8 g/dL — ABNORMAL LOW (ref 12.0–15.0)
MCH: 25.7 pg — ABNORMAL LOW (ref 26.0–34.0)
MCHC: 29.4 g/dL — ABNORMAL LOW (ref 30.0–36.0)
MCV: 87.5 fL (ref 80.0–100.0)
Platelets: 232 10*3/uL (ref 150–400)
RBC: 3.03 MIL/uL — ABNORMAL LOW (ref 3.87–5.11)
RDW: 14.6 % (ref 11.5–15.5)
WBC: 11.3 10*3/uL — ABNORMAL HIGH (ref 4.0–10.5)
nRBC: 0 % (ref 0.0–0.2)

## 2022-07-07 LAB — URINALYSIS, ROUTINE W REFLEX MICROSCOPIC
Bilirubin Urine: NEGATIVE
Glucose, UA: NEGATIVE mg/dL
Hgb urine dipstick: NEGATIVE
Ketones, ur: NEGATIVE mg/dL
Leukocytes,Ua: NEGATIVE
Nitrite: NEGATIVE
Protein, ur: NEGATIVE mg/dL
Specific Gravity, Urine: 1.012 (ref 1.005–1.030)
pH: 5 (ref 5.0–8.0)

## 2022-07-07 LAB — GLUCOSE, CAPILLARY: Glucose-Capillary: 182 mg/dL — ABNORMAL HIGH (ref 70–99)

## 2022-07-07 LAB — BASIC METABOLIC PANEL
Anion gap: 10 (ref 5–15)
BUN: 62 mg/dL — ABNORMAL HIGH (ref 8–23)
CO2: 23 mmol/L (ref 22–32)
Calcium: 9.9 mg/dL (ref 8.9–10.3)
Chloride: 102 mmol/L (ref 98–111)
Creatinine, Ser: 2.8 mg/dL — ABNORMAL HIGH (ref 0.44–1.00)
GFR, Estimated: 16 mL/min — ABNORMAL LOW (ref >60)
Glucose, Bld: 130 mg/dL — ABNORMAL HIGH (ref 70–99)
Potassium: 5.5 mmol/L — ABNORMAL HIGH (ref 3.5–5.1)
Sodium: 135 mmol/L (ref 135–145)

## 2022-07-07 LAB — PROCALCITONIN: Procalcitonin: 0.77 ng/mL

## 2022-07-07 LAB — MAGNESIUM: Magnesium: 2.3 mg/dL (ref 1.7–2.4)

## 2022-07-07 LAB — MRSA NEXT GEN BY PCR, NASAL: MRSA by PCR Next Gen: NOT DETECTED

## 2022-07-07 MED ORDER — FUROSEMIDE 10 MG/ML IJ SOLN
60.0000 mg | Freq: Once | INTRAMUSCULAR | Status: AC
  Administered 2022-07-07: 60 mg via INTRAVENOUS
  Filled 2022-07-07: qty 6

## 2022-07-07 MED ORDER — SODIUM ZIRCONIUM CYCLOSILICATE 10 G PO PACK
10.0000 g | PACK | Freq: Once | ORAL | Status: AC
  Administered 2022-07-07: 10 g via ORAL
  Filled 2022-07-07: qty 1

## 2022-07-07 MED ORDER — SODIUM ZIRCONIUM CYCLOSILICATE 10 G PO PACK: 10.0000 g | PACK | Freq: Three times a day (TID) | ORAL | Status: DC

## 2022-07-07 MED ORDER — METOPROLOL TARTRATE 25 MG PO TABS
25.0000 mg | ORAL_TABLET | Freq: Once | ORAL | Status: AC
  Administered 2022-07-07: 25 mg via ORAL
  Filled 2022-07-07: qty 1

## 2022-07-07 MED ORDER — METOPROLOL TARTRATE 25 MG PO TABS
100.0000 mg | ORAL_TABLET | Freq: Two times a day (BID) | ORAL | Status: DC
  Administered 2022-07-07: 100 mg via ORAL
  Filled 2022-07-07: qty 4

## 2022-07-07 NOTE — Consult Note (Addendum)
[facility]  INPATIENT CONSULTATION  Reason for Consultation: AKI  Requesting Provider: Dr. Fransico Him  HPI: [name] is an 81 y.o. female with lung Ca (stage 4 adenoca, f/u Dr. Earlie Server on [place]), HTN, GERD, HFpEF, MGUS, osteopenia, chronic hypoxia on 2L, CKD, remote nephrolithiasis and other med history listed below who is currently admitted for acute hypoxic respiratory failure and nephrology is consulted for evaluation and management of AKI on CKD.      Was in ED 07/03/22 for dyspnea, hypoxia - CTA chest neg PE but with pleural effusions, concern for pleural mets, adenopathy, pulm edema.  Admission was recommended but she elected to go home.  Returned 12/7 with worsening dyspnea and hypoxia, A fib with RVR requiring IV cardizem gtt.  Coverage for CAP with CTX/azithromycin.  Thoracentesis 12/7 with 461m fluid - exudative.  GYolanda Bonineis bedside currently.  She's a bit lethargic but able to answer questions slowly - said she was more alert until she got robitussin earlier.  Dyspnea improved.  No voiding difficulties.  Feels mild edema.   BPs have been labile intermittently as low as 70/30s - had protracted hypotension overnight 12/7-8.   I/Os net +2.9 for the admission; lasix 40 IV given x 1 12/7 and 12/8.  UOP 1566m12/7, 75088m2/8.  No NSAIDs.   Lives at home with husband who has dementia.  Lift chair, walker to ambulate short distances.  Doesn't drive.  Multiple family members help.  . PMarland KitchenH: Past Medical History:  Diagnosis Date   Allergy    Angiodysplasia of cecum    Angiodysplasia of duodenum    Arthritis    Asteatotic eczema 02/25/2016   Asthma    Atrial fibrillation (HCCTchula3/21/2019   Blood transfusion without reported diagnosis    Cataract    CHF (congestive heart failure), NYHA class III, chronic, combined (HCCCarthage1/06/2018   Colonic polyp 02/16/2008   Tubular adenoma   COPD (chronic obstructive pulmonary disease) (HCCFair[place]7/07/1997   Cor pulmonale  (HCCLauderhill4/19/2016   GERD 07/30/1992   Gout    HTN (hypertension) 07/30/1988   Hyperlipidemia with target LDL less than 100 11/27/1996        Kidney stone 1960, 1972, 1991   lung ca 04/2021   Medical history non-contributory    MGUS (monoclonal gammopathy of unknown significance) 11/08/2016   Morbid obesity (HCCLumpkin9/21/2011   She agrees to work on her lifestyle modifications to help her lose weight.    OSA (obstructive sleep apnea) 06/22/2013   Osteopenia, senile 02/05/2013   July 2014  -2.1 left femur -1.9 left forearm    Prediabetes 11/13/2006   Renal insufficiency    Stenosis of cervical spine with myelopathy (HCCPulaski6/11/2017   Supplemental oxygen dependent    2L via [place]   Ulcer of leg, chronic, right (HCCWasatch3/13/2013   ULCERATIVE COLITIS-LEFT SIDE 03/19/2008        Vitamin B12 deficiency anemia 11/13/2006        Vitamin D deficiency 02/06/2013   PSH: Past Surgical History:  Procedure Laterality Date   BRONCHOSCOPY     CHEST TUBE INSERTION Left 06/15/2021   Procedure: INSERTION PLEURAL DRAINAGE CATHETER;  Surgeon: HenMelrose NakayamaD;  [facility];  Laterality: Left;   COLONOSCOPY     COLONOSCOPY WITH PROPOFOL N/A 02/23/2015   Procedure: COLONOSCOPY WITH PROPOFOL;  Surgeon: MalLadene ArtistD;  [facility];  Service: Endoscopy;  Laterality: N/A;   COLONOSCOPY WITH PROPOFOL N/A  08/12/2018   Procedure: COLONOSCOPY WITH PROPOFOL;  Surgeon: Ladene Artist, MD;  [facility];  Service: Endoscopy;  Laterality: N/A;   COLONOSCOPY WITH PROPOFOL N/A 05/09/2020   Procedure: COLONOSCOPY WITH PROPOFOL;  Surgeon: Ladene Artist, MD;  [facility];  Service: Endoscopy;  Laterality: N/A;  To Splenic Flexture   COLONOSCOPY WITH PROPOFOL N/A 11/23/2021   Procedure: COLONOSCOPY WITH PROPOFOL;  Surgeon: Mauri Pole, MD;  [facility];  Service: Gastroenterology;  Laterality: N/A;   ESOPHAGOGASTRODUODENOSCOPY N/A  11/23/2021   Procedure: ESOPHAGOGASTRODUODENOSCOPY (EGD);  Surgeon: Mauri Pole, MD;  [facility];  Service: Gastroenterology;  Laterality: N/A;   ESOPHAGOGASTRODUODENOSCOPY (EGD) WITH PROPOFOL N/A 02/23/2015   Procedure: ESOPHAGOGASTRODUODENOSCOPY (EGD) WITH PROPOFOL;  Surgeon: Ladene Artist, MD;  [facility];  Service: Endoscopy;  Laterality: N/A;   ESOPHAGOGASTRODUODENOSCOPY (EGD) WITH PROPOFOL N/A 08/12/2018   Procedure: ESOPHAGOGASTRODUODENOSCOPY (EGD) WITH PROPOFOL;  Surgeon: Ladene Artist, MD;  [facility];  Service: Endoscopy;  Laterality: N/A;   ESOPHAGOGASTRODUODENOSCOPY (EGD) WITH PROPOFOL N/A 05/08/2020   Procedure: ESOPHAGOGASTRODUODENOSCOPY (EGD) WITH PROPOFOL;  Surgeon: Yetta Flock, MD;  [facility];  Service: Gastroenterology;  Laterality: N/A;   HEMOSTASIS CLIP PLACEMENT  11/23/2021   Procedure: HEMOSTASIS CLIP PLACEMENT;  Surgeon: Mauri Pole, MD;  [facility];  Service: Gastroenterology;;   HOT HEMOSTASIS N/A 08/12/2018   Procedure: HOT HEMOSTASIS (ARGON PLASMA COAGULATION/BICAP);  Surgeon: Ladene Artist, MD;  [facility];  Service: Endoscopy;  Laterality: N/A;  EGD and Colon APC   HOT HEMOSTASIS N/A 05/08/2020   Procedure: HOT HEMOSTASIS (ARGON PLASMA COAGULATION/BICAP);  Surgeon: Yetta Flock, MD;  [facility];  Service: Gastroenterology;  Laterality: N/A;   HOT HEMOSTASIS N/A 11/23/2021   Procedure: HOT HEMOSTASIS (ARGON PLASMA COAGULATION/BICAP);  Surgeon: Mauri Pole, MD;  [facility];  Service: Gastroenterology;  Laterality: N/A;   LAPAROTOMY N/A 08/11/2020   Procedure: EXPLORATORY LAPAROTOMY PARTIAL COLECTOMY AND COLOSTOMY WITH RESECTION OF A MESSENTERIC MASS;  Surgeon: Coralie Keens, MD;  [facility];  Service: General;  Laterality: N/A;   POLYPECTOMY     REMOVAL OF PLEURAL DRAINAGE CATHETER N/A 10/17/2021   Procedure: MINOR REMOVAL  OF PLEURAL DRAINAGE CATHETER;  Surgeon: [name], MD;  Location: [facility];  Service: Cardiopulmonary;  Laterality: N/A;    Past Medical History:  Diagnosis Date   Allergy    Angiodysplasia of cecum    Angiodysplasia of duodenum    Arthritis    Asteatotic eczema 02/25/2016   Asthma    Atrial fibrillation ([place]) 10/17/2017   Blood transfusion without reported diagnosis    Cataract    CHF (congestive heart failure), NYHA class III, chronic, combined ([place]) 08/10/2017   Colonic polyp 02/16/2008   Tubular adenoma   COPD (chronic obstructive pulmonary disease) ([place]) 01/27/1998   Cor pulmonale ([place]) 11/16/2014   GERD 07/30/1992   Gout    HTN (hypertension) 07/30/1988   Hyperlipidemia with target LDL less than 100 11/27/1996        Kidney stone 1960, 1972, 1991   lung ca 04/2021   Medical history non-contributory    MGUS (monoclonal gammopathy of unknown significance) 11/08/2016   Morbid obesity ([place]) 04/19/2010   She agrees to work on her lifestyle modifications to help her lose weight.    OSA (obstructive sleep apnea) 06/22/2013   Osteopenia, senile 02/05/2013   July 2014  -2.1 left femur -1.9 left forearm    Prediabetes 11/13/2006  Renal insufficiency    Stenosis of cervical spine with myelopathy ([place]) 01/01/2018   Supplemental oxygen dependent    2L via [place]   Ulcer of leg, chronic, right ([place]) 10/10/2011   ULCERATIVE COLITIS-LEFT SIDE 03/19/2008        Vitamin B12 deficiency anemia 11/13/2006        Vitamin D deficiency 02/06/2013    Medications:  I have reviewed the patient's current medications.  Medications Prior to Admission  Medication Sig Dispense Refill   acetaminophen (TYLENOL) 325 MG tablet Take 325 mg by mouth daily as needed for moderate pain, fever or headache.     albuterol (PROAIR HFA) 108 (90 Base) MCG/ACT inhaler Inhale 1-2 puffs into the lungs every 6 (six) hours as needed for wheezing or shortness of breath. (Patient taking differently:  Inhale 1-2 puffs into the lungs every 4 (four) hours as needed for wheezing or shortness of breath (or coughing).) 18 g 11   allopurinol (ZYLOPRIM) 100 MG tablet Take 1 tablet (100 mg total) by mouth daily. 90 tablet 1   BREO ELLIPTA 100-25 MCG/ACT AEPB INHALE 1 PUFF INTO THE LUNGS DAILY (Patient taking differently: Inhale 1 puff into the lungs daily.) 120 each 1   clonazePAM (KLONOPIN) 1 MG tablet Take 1 tablet (1 mg total) by mouth 2 (two) times daily as needed for anxiety. (Patient taking differently: Take 0.5 mg by mouth daily as needed for anxiety.) 180 tablet 0   Ensure (ENSURE) Take 237 mLs by mouth daily.     ferrous sulfate 325 (65 FE) MG tablet Take 1 tablet (325 mg total) by mouth 2 (two) times daily with a meal. 180 tablet 1   isosorbide-hydrALAZINE (BIDIL) 20-37.5 MG tablet TAKE 1 TABLET BY MOUTH THREE TIMES DAILY (Patient taking differently: Take 1 tablet by mouth in the morning and at bedtime.) 90 tablet 0   metoprolol tartrate (LOPRESSOR) 25 MG tablet TAKE 1 TABLET(25 MG) BY MOUTH TWICE DAILY (Patient taking differently: Take 25 mg by mouth in the morning and at bedtime.) 180 tablet 1   miconazole (ATHLETES FOOT) 2 % powder Apply 1 Application topically as needed for itching (affected sites).     Nutritional Supplements (CARNATION BREAKFAST ESSENTIALS) LIQD Take 237 mLs by mouth daily as needed (nutrition support).     osimertinib mesylate (TAGRISSO) 80 MG tablet Take 1 tablet (80 mg total) by mouth daily. 30 tablet 3   OXYGEN Inhale 2 L/min into the lungs continuous.     prochlorperazine (COMPAZINE) 10 MG tablet Take 1 tablet (10 mg total) by mouth every 6 (six) hours as needed. (Patient taking differently: Take 10 mg by mouth as needed for nausea or vomiting.) 30 tablet 2   torsemide (DEMADEX) 20 MG tablet TAKE 2 TABLETS(40 MG) BY MOUTH DAILY (Patient taking differently: Take 40 mg by mouth in the morning.) 180 tablet 0   cyanocobalamin 2000 MCG tablet Take 1 tablet (2,000 mcg total)  by mouth daily. (Patient not taking: Reported on 07/03/2022) 90 tablet 3   nystatin (MYCOSTATIN/NYSTOP) powder Apply 1 application. topically 3 (three) times daily. (Patient not taking: Reported on 07/03/2022) 60 g 0    ALLERGIES:   Allergies  Allergen Reactions   Celebrex [Celecoxib] Swelling and Other (See Comments)    Ankles swell   Amlodipine Besylate Swelling and Other (See Comments)    Ankles swell   Enalapril Cough   Lipitor [Atorvastatin] Other (See Comments)    Muscle aches   Trelegy Ellipta [Fluticasone-Umeclidin-Vilant] Other (See Comments)  Made the throat and esophagus "hurt" Uses Breo (fluticasone - vilanterol) on a regular basis   Amoxicillin Diarrhea    DID THE REACTION INVOLVE: Swelling of the face/tongue/throat, SOB, or low BP? N Sudden or severe rash/hives, skin peeling, or the inside of the mouth or nose? N Did it require medical treatment? N When did it last happen?      MORE THAN 10 YEARS If all above answers are "NO", may proceed with cephalosporin use.    Codeine Sulfate Nausea And Vomiting   Hydrocodone-Acetaminophen Nausea And Vomiting   Tape Itching, Rash and Other (See Comments)    Redness, also    FAM HX: Family History  Problem Relation Age of Onset   Stroke Mother    Diabetes Mother    Kidney cancer Mother    Stomach cancer Mother    Heart disease Mother    Stroke Father    Heart disease Brother    Heart disease Brother    Crohn's disease Brother    Heart disease Sister        CATH,STENT   Clotting disorder Sister    Cervical cancer Sister    Lung cancer Sister    Heart attack Sister    Diabetes Sister    Colon cancer Neg Hx    Esophageal cancer Neg Hx    Rectal cancer Neg Hx     Social History:   reports that she quit smoking about 26 years ago. Her smoking use included cigarettes. She has never used smokeless tobacco. She reports that she does not drink alcohol and does not use drugs.  ROS: 12 system ROS neg excpet per HPI  above  Blood pressure (!) 138/56, pulse (!) 110, temperature 98 F (36.7 C), temperature source Oral, resp. rate 20, height 5' 5"  (1.651 m), weight 78.3 kg, SpO2 93 %. PHYSICAL EXAM: Gen: frail, chronically ill appearing  Eyes: glasses, anicteric ENT: MM tacky mouth breathing Neck: supple CV:  irreg, rate in the 90-100 range, no rub Abd:  soft Lungs: dec bs bases - normal WOB at rest GU: purewick with ~333m yellow, clear urine in can Extr: trace diffuse edema Neuro: drowsy but arousable and appropriately answering questions Skin: no rashes, diffuse venous stasis changes of pretibial skin    Results for orders placed or performed during the hospital encounter of 07/21/2022 (from the past 48 hour(s))  CBC with Differential/Platelet     Status: Abnormal   Collection Time: 07/04/2022 12:30 PM  Result Value Ref Range   WBC 14.4 (H) 4.0 - 10.5 K/uL   RBC 3.47 (L) 3.87 - 5.11 MIL/uL   Hemoglobin 9.0 (L) 12.0 - 15.0 g/dL   HCT 29.6 (L) 36.0 - 46.0 %   MCV 85.3 80.0 - 100.0 fL   MCH 25.9 (L) 26.0 - 34.0 pg   MCHC 30.4 30.0 - 36.0 g/dL   RDW 14.5 11.5 - 15.5 %   Platelets 248 150 - 400 K/uL   nRBC 0.0 0.0 - 0.2 %   Neutrophils Relative % 86 %   Neutro Abs 12.5 (H) 1.7 - 7.7 K/uL   Lymphocytes Relative 3 %   Lymphs Abs 0.4 (L) 0.7 - 4.0 K/uL   Monocytes Relative 9 %   Monocytes Absolute 1.3 (H) 0.1 - 1.0 K/uL   Eosinophils Relative 1 %   Eosinophils Absolute 0.1 0.0 - 0.5 K/uL   Basophils Relative 0 %   Basophils Absolute 0.1 0.0 - 0.1 K/uL   Immature Granulocytes 1 %  Abs Immature Granulocytes 0.10 (H) 0.00 - 0.07 K/uL    Comment: Performed at [facility] [place] [address] [place], [place] [address]  Brain natriuretic peptide     Status: Abnormal   Collection Time: 07/19/2022 12:30 PM  Result Value Ref Range   B Natriuretic Peptide 307.6 (H) 0.0 - 100.0 pg/mL    Comment: Performed at [facility], [place] [address] [place], [place] [address]   Basic metabolic panel     Status: Abnormal   Collection Time: 07/15/2022 12:30 PM  Result Value Ref Range   Sodium 135 135 - 145 mmol/L   Potassium 4.8 3.5 - 5.1 mmol/L    Comment: HEMOLYSIS AT THIS LEVEL MAY AFFECT RESULT   Chloride 99 98 - 111 mmol/L   CO2 28 22 - 32 mmol/L   Glucose, Bld 166 (H) 70 - 99 mg/dL    Comment: Glucose reference range applies only to samples taken after fasting for at least 8 hours.   BUN 56 (H) 8 - 23 mg/dL   Creatinine, Ser 2.14 (H) 0.44 - 1.00 mg/dL   Calcium 9.9 8.9 - 10.3 mg/dL   GFR, Estimated 23 (L) >60 mL/min    Comment: (NOTE) Calculated using the CKD-EPI Creatinine Equation (2021)    Anion gap 8 5 - 15    Comment: Performed at [facility], [place] [address], [place], [place] [address]  Pleural fluid culture w Gram Stain     Status: None (Preliminary result)   Collection Time: 07/29/2022  4:12 PM   Specimen: Pleural Fluid  Result Value Ref Range   Specimen Description      PLEURAL Performed at [facility], [place] [address] [place], [place] [address]    Special Requests      NONE Performed at [facility], [place] [address] [place], [place] [address]    Gram Stain      FEW WBC PRESENT,BOTH PMN AND MONONUCLEAR NO ORGANISMS SEEN    Culture      NO GROWTH 2 DAYS Performed at [facility], [place] [address] [place], [place] [address]    Report Status PENDING   Glucose, pleural fluid     Status: None   Collection Time: 07/03/2022  4:12 PM  Result Value Ref Range   Glucose, Fluid 145 mg/dL    Comment: (NOTE) No normal range established for this test Results should be evaluated in conjunction with serum values    Fluid Type-FGLU PLEURAL     Comment: Performed at [facility], [place] [address], [place], [place] [address]  Protein, pleural fluid     Status: None   Collection Time: 07/15/2022  4:12 PM  Result Value Ref Range   Total protein, fluid 4.0 g/dL    Comment:  (NOTE) No normal range established for this test Results should be evaluated in conjunction with serum values    Fluid Type-FTP PLEURAL     Comment: Performed at [facility], [place] [address], [place],  [address]  Pleural fluid cell count with differential     Status: Abnormal   Collection Time: 06/29/2022  4:12 PM  Result Value Ref Range   Fluid Type-FCT PLEURAL    Color, Fluid RED (A) YELLOW   Appearance, Fluid TURBID (A) CLEAR   Total Nucleated Cell Count, Fluid 1,806 (H) 0 - 1,000 cu mm   Neutrophil Count, Fluid 53 (H) 0 - 25 %   Lymphs,  Fluid 27 %   Monocyte-Macrophage-Serous Fluid 20 (L) 50 - 90 %   Eos, Fluid 0 %    Comment: Performed at [facility], [place] [address] [place], [place] [address]  Lactate dehydrogenase, pleural fluid     Status: Abnormal   Collection Time: 07/26/2022  4:12 PM  Result Value Ref Range   LD, Fluid 58 (H) 3 - 23 U/L    Comment: (NOTE) Results should be evaluated in conjunction with serum values    Fluid Type-FLDH PLEURAL     Comment: Performed at [facility], [place] [address] [place], [place] [address]  Respiratory (~20 pathogens) panel by PCR     Status: None   Collection Time: 06/29/2022  4:53 PM   Specimen: Nasopharyngeal Swab; Respiratory  Result Value Ref Range   Adenovirus NOT DETECTED NOT DETECTED   Coronavirus 229E NOT DETECTED NOT DETECTED    Comment: (NOTE) The Coronavirus on the Respiratory Panel, DOES NOT test for the novel  Coronavirus (2019 nCoV)    Coronavirus HKU1 NOT DETECTED NOT DETECTED   Coronavirus NL63 NOT DETECTED NOT DETECTED   Coronavirus OC43 NOT DETECTED NOT DETECTED   Metapneumovirus NOT DETECTED NOT DETECTED   Rhinovirus / Enterovirus NOT DETECTED NOT DETECTED   Influenza A NOT DETECTED NOT DETECTED   Influenza B NOT DETECTED NOT DETECTED   Parainfluenza Virus 1 NOT DETECTED NOT DETECTED   Parainfluenza Virus 2 NOT DETECTED NOT DETECTED   Parainfluenza  Virus 3 NOT DETECTED NOT DETECTED   Parainfluenza Virus 4 NOT DETECTED NOT DETECTED   Respiratory Syncytial Virus NOT DETECTED NOT DETECTED   Bordetella pertussis NOT DETECTED NOT DETECTED   Bordetella Parapertussis NOT DETECTED NOT DETECTED   Chlamydophila pneumoniae NOT DETECTED NOT DETECTED   Mycoplasma pneumoniae NOT DETECTED NOT DETECTED    Comment: Performed at [facility], [place] [address], [place], [place] [address]  Lactate dehydrogenase     Status: Abnormal   Collection Time: 06/30/2022  6:11 PM  Result Value Ref Range   LDH 90 (L) 98 - 192 U/L    Comment: Performed at [facility], [place] [address] [place], [place] [address]  Protein, total     Status: None   Collection Time: 07/06/2022  6:11 PM  Result Value Ref Range   Total Protein 7.8 6.5 - 8.1 g/dL    Comment: Performed at [facility], [place] [address] [place], [place] [address]  Procalcitonin - Baseline     Status: None   Collection Time: 07/24/2022  6:11 PM  Result Value Ref Range   Procalcitonin 0.29 ng/mL    Comment:        Interpretation: PCT (Procalcitonin) <= 0.5 ng/mL: Systemic infection (sepsis) is not likely. Local bacterial infection is possible. (NOTE)       Sepsis PCT Algorithm           Lower Respiratory Tract                                      Infection PCT Algorithm    ----------------------------     ----------------------------         PCT < 0.25 ng/mL                PCT < 0.10 ng/mL          Strongly encourage  Strongly discourage   discontinuation of antibiotics    initiation of antibiotics    ----------------------------     -----------------------------       PCT 0.25 - 0.50 ng/mL            PCT 0.10 - 0.25 ng/mL               OR       >80% decrease in PCT            Discourage initiation of                                            antibiotics      Encourage discontinuation           of antibiotics    ----------------------------      -----------------------------         PCT >= 0.50 ng/mL              PCT 0.26 - 0.50 ng/mL               AND        <80% decrease in PCT             Encourage initiation of                                             antibiotics       Encourage continuation           of antibiotics    ----------------------------     -----------------------------        PCT >= 0.50 ng/mL                  PCT > 0.50 ng/mL               AND         increase in PCT                  Strongly encourage                                      initiation of antibiotics    Strongly encourage escalation           of antibiotics                                     -----------------------------                                           PCT <= 0.25 ng/mL                                                 OR                                        >  80% decrease in PCT                                      Discontinue / Do not initiate                                             antibiotics  Performed at [place] [address], [place], [place] [address]   Strep pneumoniae urinary antigen     Status: None   Collection Time: 07/12/2022  8:04 PM  Result Value Ref Range   Strep Pneumo Urinary Antigen NEGATIVE NEGATIVE    Comment:        Infection due to S. pneumoniae cannot be absolutely ruled out since the antigen present may be below the detection limit of the test. Performed at [facility], [address] [place], [place] [address]   Basic metabolic panel     Status: Abnormal   Collection Time: 07/06/22  3:19 AM  Result Value Ref Range   Sodium 131 (L) 135 - 145 mmol/L   Potassium 4.7 3.5 - 5.1 mmol/L   Chloride 96 (L) 98 - 111 mmol/L   CO2 23 22 - 32 mmol/L   Glucose, Bld 132 (H) 70 - 99 mg/dL    Comment: Glucose reference range applies only to samples taken after fasting for at least 8 hours.   BUN 51 (H) 8 - 23 mg/dL   Creatinine, Ser 2.35 (H) 0.44 - 1.00 mg/dL   Calcium  9.2 8.9 - 10.3 mg/dL   GFR, Estimated 20 (L) >60 mL/min    Comment: (NOTE) Calculated using the CKD-EPI Creatinine Equation (2021)    Anion gap 12 5 - 15    Comment: Performed at [facility], [place] [address] [place], [place] [address]  CBC     Status: Abnormal   Collection Time: 07/06/22  3:19 AM  Result Value Ref Range   WBC 16.2 (H) 4.0 - 10.5 K/uL   RBC 3.27 (L) 3.87 - 5.11 MIL/uL   Hemoglobin 8.5 (L) 12.0 - 15.0 g/dL   HCT 28.6 (L) 36.0 - 46.0 %   MCV 87.5 80.0 - 100.0 fL   MCH 26.0 26.0 - 34.0 pg   MCHC 29.7 (L) 30.0 - 36.0 g/dL   RDW 14.4 11.5 - 15.5 %   Platelets 246 150 - 400 K/uL   nRBC 0.0 0.0 - 0.2 %    Comment: Performed at [facility], [place] [address] [place] [address], [place], [place] [address]  Magnesium     Status: None   Collection Time: 07/06/22  3:19 AM  Result Value Ref Range   Magnesium 2.3 1.7 - 2.4 mg/dL    Comment: Performed at [facility], [place] [address], [place], [place] [address]  Procalcitonin     Status: None   Collection Time: 07/06/22  3:19 AM  Result Value Ref Range   Procalcitonin 0.57 ng/mL    Comment:        Interpretation: PCT > 0.5 ng/mL and <= 2 ng/mL: Systemic infection (sepsis) is possible, but other conditions are known to elevate PCT as well. (NOTE)       Sepsis PCT Algorithm           Lower Respiratory Tract  Infection PCT Algorithm    ----------------------------     ----------------------------         PCT < 0.25 ng/mL                PCT < 0.10 ng/mL          Strongly encourage             Strongly discourage   discontinuation of antibiotics    initiation of antibiotics    ----------------------------     -----------------------------       PCT 0.25 - 0.50 ng/mL            PCT 0.10 - 0.25 ng/mL               OR       >80% decrease in PCT            Discourage initiation of                                            antibiotics      Encourage  discontinuation           of antibiotics    ----------------------------     -----------------------------         PCT >= 0.50 ng/mL              PCT 0.26 - 0.50 ng/mL                AND       <80% decrease in PCT             Encourage initiation of                                             antibiotics       Encourage continuation           of antibiotics    ----------------------------     -----------------------------        PCT >= 0.50 ng/mL                  PCT > 0.50 ng/mL               AND         increase in PCT                  Strongly encourage                                      initiation of antibiotics    Strongly encourage escalation           of antibiotics                                     -----------------------------                                           PCT <= 0.25 ng/mL  OR                                        > 80% decrease in PCT                                      Discontinue / Do not initiate                                             antibiotics  Performed at [facility] [address] [place], [place] [address]   Glucose, capillary     Status: None   Collection Time: 07/06/22  4:07 PM  Result Value Ref Range   Glucose-Capillary 99 70 - 99 mg/dL    Comment: Glucose reference range applies only to samples taken after fasting for at least 8 hours.  Basic metabolic panel     Status: Abnormal   Collection Time: 07/07/22  2:32 AM  Result Value Ref Range   Sodium 135 135 - 145 mmol/L   Potassium 5.5 (H) 3.5 - 5.1 mmol/L   Chloride 102 98 - 111 mmol/L   CO2 23 22 - 32 mmol/L   Glucose, Bld 130 (H) 70 - 99 mg/dL    Comment: Glucose reference range applies only to samples taken after fasting for at least 8 hours.   BUN 62 (H) 8 - 23 mg/dL   Creatinine, Ser 2.80 (H) 0.44 - 1.00 mg/dL   Calcium 9.9 8.9 - 10.3 mg/dL   GFR, Estimated 16 (L) >60 mL/min    Comment:  (NOTE) Calculated using the CKD-EPI Creatinine Equation (2021)    Anion gap 10 5 - 15    Comment: Performed at [facility], [place] [address], [place], [place] [address]  CBC     Status: Abnormal   Collection Time: 07/07/22  2:32 AM  Result Value Ref Range   WBC 11.3 (H) 4.0 - 10.5 K/uL   RBC 3.03 (L) 3.87 - 5.11 MIL/uL   Hemoglobin 7.8 (L) 12.0 - 15.0 g/dL   HCT 26.5 (L) 36.0 - 46.0 %   MCV 87.5 80.0 - 100.0 fL   MCH 25.7 (L) 26.0 - 34.0 pg   MCHC 29.4 (L) 30.0 - 36.0 g/dL   RDW 14.6 11.5 - 15.5 %   Platelets 232 150 - 400 K/uL   nRBC 0.0 0.0 - 0.2 %    Comment: Performed at [facility], [place] [address], [place], [place] [address]  Magnesium     Status: None   Collection Time: 07/07/22  2:32 AM  Result Value Ref Range   Magnesium 2.3 1.7 - 2.4 mg/dL    Comment: Performed at [facility], [place] [address] [place], [place] [address]  Procalcitonin     Status: None   Collection Time: 07/07/22  2:32 AM  Result Value Ref Range   Procalcitonin 0.77 ng/mL    Comment:        Interpretation: PCT > 0.5 ng/mL and <= 2 ng/mL: Systemic infection (sepsis) is possible, but other conditions are known to elevate PCT as well. (NOTE)       Sepsis PCT Algorithm  Lower Respiratory Tract                                      Infection PCT Algorithm    ----------------------------     ----------------------------         PCT < 0.25 ng/mL                PCT < 0.10 ng/mL          Strongly encourage             Strongly discourage   discontinuation of antibiotics    initiation of antibiotics    ----------------------------     -----------------------------       PCT 0.25 - 0.50 ng/mL            PCT 0.10 - 0.25 ng/mL               OR       >80% decrease in PCT            Discourage initiation of                                            antibiotics      Encourage discontinuation           of antibiotics     ----------------------------     -----------------------------         PCT >= 0.50 ng/mL              PCT 0.26 - 0.50 ng/mL                AND       <80% decrease in PCT             Encourage initiation of                                             antibiotics       Encourage continuation           of antibiotics    ----------------------------     -----------------------------        PCT >= 0.50 ng/mL                  PCT > 0.50 ng/mL               AND         increase in PCT                  Strongly encourage                                      initiation of antibiotics    Strongly encourage escalation           of antibiotics                                     -----------------------------  PCT <= 0.25 ng/mL                                                 OR                                        > 80% decrease in PCT                                      Discontinue / Do not initiate                                             antibiotics  Performed at [facility] [address], [place], [place] [address]     ECHOCARDIOGRAM COMPLETE  Result Date: 07/06/2022    ECHOCARDIOGRAM REPORT   Patient Name:   ZHOEY BLACKSTOCK Congleton Date of Exam: 07/06/2022 Medical Rec #:  [id number]       Height:       65.0 in Accession #:    [id number]      Weight:       89.5 lb Date of Birth:  Nov 27, 1940      BSA:          1.406 m Patient Age:    39 years        BP:           138/66 mmHg Patient Gender: F               HR:           106 bpm. Exam Location:  Inpatient Procedure: 2D Echo, Color Doppler and Cardiac Doppler Indications:    I42.9 Cardiomyopathy (unspecified)  History:        Patient has prior history of Echocardiogram examinations, most                 recent 11/04/2021. CHF, COPD; Risk Factors:Hypertension,                 Dyslipidemia, Sleep Apnea and Stage IV Lung Cancer.  Sonographer:    Raquel Sarna Senior RDCS Referring Phys: [phone] [facility] AMIN  Sonographer Comments: Technically difficult study, unable to get apical windows due to patient discomfort and reduced skin integrity. IMPRESSIONS  1. Left ventricular ejection fraction, by estimation, is 65 to 70%. The left ventricle has normal function. The left ventricle has no regional wall motion abnormalities. There is moderate concentric left ventricular hypertrophy. Left ventricular diastolic function could not be evaluated.  2. Right ventricular systolic function was not well visualized. The right ventricular size is not well visualized. There is moderately elevated pulmonary artery systolic pressure.  3. The mitral valve is grossly normal. Mild to moderate mitral valve regurgitation. No evidence of mitral stenosis.  4. The aortic valve is tricuspid. There is mild calcification of the aortic valve. There is mild thickening of the aortic valve. Aortic valve regurgitation is trivial. Aortic valve sclerosis/calcification is present, without any evidence of aortic stenosis.  5. The inferior vena cava is dilated in size with <50%  respiratory variability, suggesting right atrial pressure of 15 mmHg. Comparison(s): No significant change from prior study. Conclusion(s)/Recommendation(s): Normal LVEF. Unable to assess RV well as patient could not tolerate apical images. FINDINGS  Left Ventricle: Left ventricular ejection fraction, by estimation, is 65 to 70%. The left ventricle has normal function. The left ventricle has no regional wall motion abnormalities. The left ventricular internal cavity size was normal in size. There is  moderate concentric left ventricular hypertrophy. Left ventricular diastolic function could not be evaluated due to atrial fibrillation. Left ventricular diastolic function could not be evaluated. Right Ventricle: The right ventricular size is not well visualized. Right vetricular wall thickness was not well visualized. Right ventricular systolic function was not well  visualized. There is moderately elevated pulmonary artery systolic pressure. The  tricuspid regurgitant velocity is 3.22 m/s, and with an assumed right atrial pressure of 15 mmHg, the estimated right ventricular systolic pressure is 61.6 mmHg. Left Atrium: Left atrial size was not well visualized. Right Atrium: Right atrial size was not well visualized. Pericardium: There is no evidence of pericardial effusion. Mitral Valve: The mitral valve is grossly normal. Mild mitral annular calcification. Mild to moderate mitral valve regurgitation. No evidence of mitral valve stenosis. Tricuspid Valve: The tricuspid valve is normal in structure. Tricuspid valve regurgitation is mild . No evidence of tricuspid stenosis. Aortic Valve: The aortic valve is tricuspid. There is mild calcification of the aortic valve. There is mild thickening of the aortic valve. Aortic valve regurgitation is trivial. Aortic valve sclerosis/calcification is present, without any evidence of aortic stenosis. Pulmonic Valve: The pulmonic valve was not well visualized. Pulmonic valve regurgitation is not visualized. Aorta: The aortic root and ascending aorta are structurally normal, with no evidence of dilitation. Venous: The inferior vena cava is dilated in size with less than 50% respiratory variability, suggesting right atrial pressure of 15 mmHg. IAS/Shunts: The interatrial septum was not well visualized.  LEFT VENTRICLE PLAX 2D LVIDd:         4.30 cm LVIDs:         3.00 cm LV PW:         1.68 cm LV IVS:        1.63 cm LVOT diam:     2.00 cm LVOT Area:     3.14 cm  LEFT ATRIUM         Index LA diam:    4.30 cm 3.06 cm/m   AORTA Ao Root diam: 2.70 cm Ao Asc diam:  2.90 cm TRICUSPID VALVE TR Peak grad:   41.5 mmHg TR Vmax:        322.00 cm/s  SHUNTS Systemic Diam: 2.00 cm Buford Dresser MD Electronically signed by Buford Dresser MD Signature Date/Time: 07/06/2022/4:46:47 PM    Final    DG Chest Port 1 View  Result Date:  07/13/2022 CLINICAL DATA:  Status post left thoracentesis. EXAM: PORTABLE CHEST 1 VIEW COMPARISON:  Same day. FINDINGS: No pneumothorax is noted status post left-sided thoracentesis. Left pleural effusion is significantly smaller. IMPRESSION: No pneumothorax status post left thoracentesis. Electronically Signed   By: Marijo Conception M.D.   On: 07/07/2022 16:49   DG Chest Port 1 View  Result Date: 07/22/2022 CLINICAL DATA:  Shortness of breath EXAM: PORTABLE CHEST 1 VIEW COMPARISON:  Previous studies including the examination done on 07/03/2022 FINDINGS: Transverse diameter of heart is increased. Central pulmonary vessels are prominent. Increased interstitial and alveolar markings are seen in parahilar regions and lower lung fields. Left hemidiaphragm is  elevated. Small to moderate bilateral pleural effusions are seen, more so on the left side. There is interval increase in amount of left pleural effusion. There is no pneumothorax. IMPRESSION: Cardiomegaly. CHF. Small to moderate bilateral pleural effusions, more so on the left side with interval increase. Electronically Signed   By: Elmer Picker M.D.   On: 07/14/2022 13:18    Assessment/Plan **acute on chronic hypoxic respiratory failure:  multifactorial - had large pleural effusion s/p thora, CAP being covered, possible pulmonary edema, poss worsening lung cancer.   Pulm following.  Stable on 3L [place] now.   **AKI on CKD:  Baseline Cr appears to be in 1.5-56m/dL.  Was 1.7 on 12/5 and has trended up to 2.867mdL today.  Had CTA 12/5 and has had intermittent fairly marked hypotension with A fib with RVR - - suspect contrast nephropathy + ATN from hypotension.  Renal USKoreand UA for completeness.  Net + 2.9L for admission - if not already pulmonary edema she's at risk for developing if continues net + -- will give lasix 60 IV  now for goal mildly net neg (not much response to 40IV but don't want to over do it).   No indications for dialysis - should they  develop she's a very poor candidate - she volunteers this as well. Brother on dialysis out of state and she says "I'm not sure my body could take it"  **A fib: h/o paroxysmal a rib, now complicated by RVR.  Cardiology following.  Rate control fairly good now and BP holding.  Has contraindication to anticoagulation.   **HFpEF: EF 70-75%, grade 2 DD as of 10/2021.   Pulm congestion noted on imaging and she's now nearly 3L + for admission -- give lasix per above.    Tenuous, will follow.   LiJustin Mend2/03/2022, 11:58 AM

## 2022-07-07 NOTE — Progress Notes (Signed)
Patient requiring 15L NRB to keep oxygen saturation above 90%. Patient requesting to pull NRB mask off for comfort. Placed on 15L HFNC. Patient noted to desat to 86%. MD aware.

## 2022-07-07 NOTE — Progress Notes (Addendum)
Code Stroke Activated by RN due to acute left-sided weakness and facial droop.  This afternoon nursing staff noted left-sided arm weakness and facial droop.  Code stroke was called.  Stat CT of the head was done which was negative for any acute pathology but did show chronic changes.  After coming back from the CT scan patient's heart rate off-and-on elevated, getting hypoxic requiring nonrebreather for now.  She continues to have left-sided arm weakness and facial droop.  I discussed case with Dr. Leonel Ramsay from teleneurology who evaluated the patient.  Ideally would be recommended the patient get CTA head and neck due to concerns of LVO but given her renal issues, and comorbidities she would not be a candidate for thrombectomy.  I had extensive discussion with patient's daughter [name] and granddaughter [name] regarding her concerns of stroke and other ongoing medical issues.  It is determined that at this time we will proceed with conservative management including MRI brain, MRA head and neck if she is able to tolerate it.  They would not like any aggressive measures including any procedures or thrombectomy.  Patient is to remain DNR/DNI.  At any point if patient declines, they agree with no escalation of care and may be even transition to comfort care.  Unfortunately patient does have poor prognosis and would continue to recommend transition to comfort care if she further declines.  Time spent-40 minutes  Gerlean Ren MD [facility]

## 2022-07-07 NOTE — Progress Notes (Addendum)
Inpatient Code Stroke activated @ 1430.  LKWT 1000.  Recognition of symptoms @ 1430.  Dr. Leonel Ramsay on camera @ [phone].  Extended case.  mRS 3-4.  TSRN off camera @ 1450.   Jarold Song BSN, Horticulturist, commercial

## 2022-07-07 NOTE — Consult Note (Signed)
[facility] Telemedicine Consult   Requesting Provider: Reesa Chew, A Consult Participants: Bedside nursing, telestroke nurse Location of the provider: [facility] Location of the patient: [facility]  This consult was provided via telemedicine with 2-way video and audio communication. The patient/family was informed that care would be provided in this way and agreed to receive care in this manner.    Chief Complaint: Left-sided weakness  HPI: 81 year old female with stage IV lung cancer, hypertension, atrial fibrillation not on anticoagulation due to recurrent GI bleeds who was admitted to the hospital with recurrent pleural effusions.  She was in her normal state at 10 AM, but subsequently this afternoon she has been noticed to have weakness on the left side.    LKW: 10 AM tnk given?: No, outside of window by the time of symptom recognition IR Thrombectomy? No, poor baseline with limited life expectancy at baseline Modified Rankin Scale: 3-Moderate disability-requires help but walks WITHOUT assistance Time of teleneurologist evaluation: 1441  Exam: Vitals:   07/07/22 1243 07/07/22 1354  BP: 119/73   Pulse: (!) 102   Resp:    Temp:    SpO2:  96%    General: in bed, appears SOB  1A: Level of Consciousness - 0 1B: Ask Month and Age - 0 1C: 'Blink Eyes' & 'Squeeze Hands' - 0 2: Test Horizontal Extraocular Movements - 0 3: Test Visual Fields - 2 4: Test Facial Palsy - 1 5A: Test Left Arm Motor Drift - 2 5B: Test Right Arm Motor Drift - 0 6A: Test Left Leg Motor Drift - 3 6B: Test Right Leg Motor Drift - 0 7: Test Limb Ataxia - 0 8: Test Sensation - 1 9: Test Language/Aphasia- 0 10: Test Dysarthria - 1 11: Test Extinction/Inattention - 2 NIHSS score: 12   Imaging Reviewed: CT head-no acute findings  Labs reviewed in epic and pertinent values follow: Creatinine 2.8   Assessment: 81 year old female with recurrent pleural  effusions in the setting of stage IV lung cancer undergoing chemotherapy with Tagrisso.  My suspicion is that she does have a fairly large right MCA territory area of ischemia.  The patient has expressed a wish to be DNR/DNI, and I discussed with her whether she would want a procedure to try and reverse the stroke that would require intubation and she was very clear that she would not want this.  I am not sure she understands the severity of her situation, and therefore I did try to call family, but especially given her poor baseline, my suspicion is that if she were intubated for procedure it would be very difficult to extubate her.  Given her previously expressed wishes, I think aggressive intervention such as thrombectomy would not be in keeping with either what she is indicating now or what has expressed previously during this hospitalization.  My suspicion is that her stroke is likely due to atrial fibrillation.    Recommendations:  1) MRI brain, MRA head and neck 2) depending on the results, as well as her underlying medical condition with recurrent pleural effusions, may need to discuss goals of care, especially if a decline in functional status impacts her ability to receive chemotherapy. 3) aspirin if not contraindicated from GI bleeding/pulmonary perspective 4) if aggressive care such as hypertonic saline, etc. is desired by family, may need transfer to [facility]  5) neurology will continue to follow  Roland Rack, MD [facility] [phone]  If 7pm- 7am, please page neurology on call as listed  in [place].

## 2022-07-07 NOTE — Progress Notes (Addendum)
Incorrect PT for Flutter note.

## 2022-07-07 NOTE — Progress Notes (Addendum)
Progress Note  Patient Name: [name] Date of Encounter: 07/07/2022  [facility] Cardiologist: Larae Grooms, MD   Subjective   O2 sats 93% on 5L HFNC.   Put out 43cc UOP yesterday and still 2.9L + and SCr continues to trend upward.  Inpatient Medications    Scheduled Meds:  azithromycin  500 mg Oral Daily   Chlorhexidine Gluconate Cloth  6 each Topical Daily   ferrous sulfate  325 mg Oral BID WC   heparin injection (subcutaneous)  5,000 Units Subcutaneous Q8H   ipratropium  0.5 mg Nebulization Q6H   methylPREDNISolone (SOLU-MEDROL) injection  40 mg Intravenous Daily   metoprolol tartrate  75 mg Oral BID   nystatin   Topical BID   Continuous Infusions:  cefTRIAXone (ROCEPHIN)  IV Stopped (07/06/22 1810)   PRN Meds: acetaminophen, clonazePAM, guaiFENesin, ipratropium-albuterol, metoprolol tartrate, ondansetron (ZOFRAN) IV, oxyCODONE, senna-docusate, traZODone   Vital Signs    Vitals:   07/07/22 0835 07/07/22 0907 07/07/22 1024 07/07/22 1100  BP:  (!) 104/57 (!) 151/53 (!) 138/56  Pulse:  (!) 131 (!) 109 (!) 110  Resp:  (!) 26 (!) 22 20  Temp:      TempSrc:      SpO2: 96% (!) 87% 94% 93%  Weight:      Height:        Intake/Output Summary (Last 24 hours) at 07/07/2022 1139 Last data filed at 07/07/2022 1100 Gross per 24 hour  Intake 977.17 ml  Output 900 ml  Net 77.17 ml      07/07/2022    5:00 AM 07/06/2022   10:15 AM 07/06/2022    5:00 AM  Last 3 Weights  Weight (lbs) 172 lb 9.9 oz 89 lb 8.1 oz 164 lb 0.4 oz  Weight (kg) 78.3 kg 40.6 kg 74.4 kg      Telemetry    Atrial fibrillation  - Personally Reviewed  ECG    No new EKG to review - Personally Reviewed  Physical Exam   GEN: elderly ill appearing female Neck: No JVD Cardiac: irregularly irregular, no murmurs, rubs, or gallops.  Respiratory:decreased BS at bases GI: Soft, nontender, non-distended  MS: No edema; No deformity. Neuro:  Nonfocal  Psych: Normal affect   Labs     High Sensitivity Troponin:  No results for input(s): "TROPONINIHS" in the last 720 hours.    Chemistry Recent Labs  Lab 07/03/22 0931 07/25/2022 1230 07/14/2022 1811 07/06/22 0319 07/07/22 0232  NA 136 135  --  131* 135  K 4.0 4.8  --  4.7 5.5*  CL 96* 99  --  96* 102  CO2 31 28  --  23 23  GLUCOSE 189* 166*  --  132* 130*  BUN 49* 56*  --  51* 62*  CREATININE 1.67* 2.14*  --  2.35* 2.80*  CALCIUM 10.4* 9.9  --  9.2 9.9  PROT 7.7  --  7.8  --   --   ALBUMIN 3.4*  --   --   --   --   AST 19  --   --   --   --   ALT 11  --   --   --   --   ALKPHOS 92  --   --   --   --   BILITOT 0.4  --   --   --   --   GFRNONAA 31* 23*  --  20* 16*  ANIONGAP 9 8  --  12 10     Hematology Recent Labs  Lab 07/13/2022 1230 07/06/22 0319 07/07/22 0232  WBC 14.4* 16.2* 11.3*  RBC 3.47* 3.27* 3.03*  HGB 9.0* 8.5* 7.8*  HCT 29.6* 28.6* 26.5*  MCV 85.3 87.5 87.5  MCH 25.9* 26.0 25.7*  MCHC 30.4 29.7* 29.4*  RDW 14.5 14.4 14.6  PLT 248 246 232    BNP Recent Labs  Lab 07/03/22 1158 07/29/2022 1230  BNP 238.4* 307.6*     DDimer No results for input(s): "DDIMER" in the last 168 hours.   CHA2DS2-VASc Score = 5   This indicates a 7.2% annual risk of stroke. The patient's score is based upon: CHF History: 1 HTN History: 1 Diabetes History: 0 Stroke History: 0 Vascular Disease History: 0 Age Score: 2 Gender Score: 1      Radiology    ECHOCARDIOGRAM COMPLETE  Result Date: 07/06/2022    ECHOCARDIOGRAM REPORT   Patient Name:   [name] Fertig Date of Exam: 07/06/2022 Medical Rec #:  [id number]       Height:       65.0 in Accession #:    [id number]      Weight:       89.5 lb Date of Birth:  Jan 06, 1941      BSA:          1.406 m Patient Age:    47 years        BP:           138/66 mmHg Patient Gender: F               HR:           106 bpm. Exam Location:  Inpatient Procedure: 2D Echo, Color Doppler and Cardiac Doppler Indications:    I42.9 Cardiomyopathy (unspecified)  History:         Patient has prior history of Echocardiogram examinations, most                 recent 11/04/2021. CHF, COPD; Risk Factors:Hypertension,                 Dyslipidemia, Sleep Apnea and Stage IV Lung Cancer.  Sonographer:    Raquel Sarna Senior RDCS Referring Phys: [phone] [facility] AMIN  Sonographer Comments: Technically difficult study, unable to get apical windows due to patient discomfort and reduced skin integrity. IMPRESSIONS  1. Left ventricular ejection fraction, by estimation, is 65 to 70%. The left ventricle has normal function. The left ventricle has no regional wall motion abnormalities. There is moderate concentric left ventricular hypertrophy. Left ventricular diastolic function could not be evaluated.  2. Right ventricular systolic function was not well visualized. The right ventricular size is not well visualized. There is moderately elevated pulmonary artery systolic pressure.  3. The mitral valve is grossly normal. Mild to moderate mitral valve regurgitation. No evidence of mitral stenosis.  4. The aortic valve is tricuspid. There is mild calcification of the aortic valve. There is mild thickening of the aortic valve. Aortic valve regurgitation is trivial. Aortic valve sclerosis/calcification is present, without any evidence of aortic stenosis.  5. The inferior vena cava is dilated in size with <50% respiratory variability, suggesting right atrial pressure of 15 mmHg. Comparison(s): No significant change from prior study. Conclusion(s)/Recommendation(s): Normal LVEF. Unable to assess RV well as patient could not tolerate apical images. FINDINGS  Left Ventricle: Left ventricular ejection fraction, by estimation, is 65 to 70%. The left ventricle has normal function. The left ventricle has no  regional wall motion abnormalities. The left ventricular internal cavity size was normal in size. There is  moderate concentric left ventricular hypertrophy. Left ventricular diastolic function could not be evaluated due  to atrial fibrillation. Left ventricular diastolic function could not be evaluated. Right Ventricle: The right ventricular size is not well visualized. Right vetricular wall thickness was not well visualized. Right ventricular systolic function was not well visualized. There is moderately elevated pulmonary artery systolic pressure. The  tricuspid regurgitant velocity is 3.22 m/s, and with an assumed right atrial pressure of 15 mmHg, the estimated right ventricular systolic pressure is 41.6 mmHg. Left Atrium: Left atrial size was not well visualized. Right Atrium: Right atrial size was not well visualized. Pericardium: There is no evidence of pericardial effusion. Mitral Valve: The mitral valve is grossly normal. Mild mitral annular calcification. Mild to moderate mitral valve regurgitation. No evidence of mitral valve stenosis. Tricuspid Valve: The tricuspid valve is normal in structure. Tricuspid valve regurgitation is mild . No evidence of tricuspid stenosis. Aortic Valve: The aortic valve is tricuspid. There is mild calcification of the aortic valve. There is mild thickening of the aortic valve. Aortic valve regurgitation is trivial. Aortic valve sclerosis/calcification is present, without any evidence of aortic stenosis. Pulmonic Valve: The pulmonic valve was not well visualized. Pulmonic valve regurgitation is not visualized. Aorta: The aortic root and ascending aorta are structurally normal, with no evidence of dilitation. Venous: The inferior vena cava is dilated in size with less than 50% respiratory variability, suggesting right atrial pressure of 15 mmHg. IAS/Shunts: The interatrial septum was not well visualized.  LEFT VENTRICLE PLAX 2D LVIDd:         4.30 cm LVIDs:         3.00 cm LV PW:         1.68 cm LV IVS:        1.63 cm LVOT diam:     2.00 cm LVOT Area:     3.14 cm  LEFT ATRIUM         Index LA diam:    4.30 cm 3.06 cm/m   AORTA Ao Root diam: 2.70 cm Ao Asc diam:  2.90 cm TRICUSPID VALVE TR Peak  grad:   41.5 mmHg TR Vmax:        322.00 cm/s  SHUNTS Systemic Diam: 2.00 cm Buford Dresser MD Electronically signed by Buford Dresser MD Signature Date/Time: 07/06/2022/4:46:47 PM    Final    DG Chest Port 1 View  Result Date: 07/08/2022 CLINICAL DATA:  Status post left thoracentesis. EXAM: PORTABLE CHEST 1 VIEW COMPARISON:  Same day. FINDINGS: No pneumothorax is noted status post left-sided thoracentesis. Left pleural effusion is significantly smaller. IMPRESSION: No pneumothorax status post left thoracentesis. Electronically Signed   By: Marijo Conception M.D.   On: 07/10/2022 16:49   DG Chest Port 1 View  Result Date: 07/24/2022 CLINICAL DATA:  Shortness of breath EXAM: PORTABLE CHEST 1 VIEW COMPARISON:  Previous studies including the examination done on 07/03/2022 FINDINGS: Transverse diameter of heart is increased. Central pulmonary vessels are prominent. Increased interstitial and alveolar markings are seen in parahilar regions and lower lung fields. Left hemidiaphragm is elevated. Small to moderate bilateral pleural effusions are seen, more so on the left side. There is interval increase in amount of left pleural effusion. There is no pneumothorax. IMPRESSION: Cardiomegaly. CHF. Small to moderate bilateral pleural effusions, more so on the left side with interval increase. Electronically Signed   By: Prudy Feeler.D.  On: 07/28/2022 13:18    Cardiac Studies   2D echo 07/06/22 IMPRESSIONS    1. Left ventricular ejection fraction, by estimation, is 65 to 70%. The  left ventricle has normal function. The left ventricle has no regional  wall motion abnormalities. There is moderate concentric left ventricular  hypertrophy. Left ventricular  diastolic function could not be evaluated.   2. Right ventricular systolic function was not well visualized. The right  ventricular size is not well visualized. There is moderately elevated  pulmonary artery systolic pressure.   3. The  mitral valve is grossly normal. Mild to moderate mitral valve  regurgitation. No evidence of mitral stenosis.   4. The aortic valve is tricuspid. There is mild calcification of the  aortic valve. There is mild thickening of the aortic valve. Aortic valve  regurgitation is trivial. Aortic valve sclerosis/calcification is present,  without any evidence of aortic  stenosis.   5. The inferior vena cava is dilated in size with <50% respiratory  variability, suggesting right atrial pressure of 15 mmHg.   Comparison(s): No significant change from prior study.   Patient Profile     81 y.o. female with a hx of chronic diastolic heart failure, history of GI bleed, PAF (not on AC due to GI bleed), sinus bradycardia, HTN, chronic SOB, OSA, Stage IV adenocarcinoma of lung  who is being seen 07/06/2022 for the evaluation of atrial fibrillation, CHF at the request of Dr. Reesa Chew.   Assessment & Plan    #Paroxysmal atrial fibrillation -Cording to office notes she has been in sinus rhythm for several years without a recurrence but now found to be back in atrial fibrillation with RVR -Has not been on anticoagulation due to history of recurrent GI bleeds hemoglobin is 8.5 today -currently on Lopressor 72m BID -Increase metoprolol to 100 mg twice daily to improve heart rate control -She is not a candidate for AAD's or cardioversion due to inability to anticoagulate   #Acute on chronic diastolic CHF -2D echo April 2023 showed EF 70 to 75% with moderate LVH and grade 2 diastolic dysfunction -Chest x-ray consistent with increased bilateral pleural effusions and BNP mildly elevated at 307 -Started on IV Lasix but serum creatinine has bumped from 2.1-2.35 likely related to cardiorenal syndrome from hypotension and A-fib with RVR -I am not convinced that her pleural effusion are CHF and likely are related to her malignancy.   -I's and O's are incomplete.  She is also getting a lot of IV fluids -will hold on any  Lasix for now and get renal to see her -Strict I's and O's and daily weights -repeat cXray today to determine if any interstitial edema is present   #Bilateral pleural effusions/acute on chronic hypoxemic respiratory failure/AECOPD -Whether this is related to her lung CA or due to CHF -That is post left thoracentesis yesterday yielding 450 cc of fluid exudative by analysis>> cytology pending -Currently on antibiotics    I have spent a total of 35 minutes with patient reviewing 2D echo , telemetry, EKGs, labs and examining patient as well as establishing an assessment and plan that was discussed with the patient.  > 50% of time was spent in direct patient care.    For questions or updates, please contact [place]il[place] BayPlease consult [email or web address] for contact info under        Signed, TFransico Him MD  07/07/2022, 11:39 AM

## 2022-07-07 NOTE — Progress Notes (Signed)
PROGRESS NOTE    [name]  VQM:[id number] DOB: February 14, 1941 DOA: 07/20/2022 PCP: [name], MD   Brief Narrative:   81 y.o. female with medical history significant of stage IV adenocarcinoma of lung followed by Dr. Earlie Server on [place] Nip, HTN, GERD, COPD, diastolic CHF, MGUS, osteopenia, chronic hypoxia on 2 L nasal cannula comes to the hospital for shortness of breath.  Patient admitted to the hospital for atrial fibrillation with RVR, acute kidney injury, CHF exacerbation, pleural effusion/malignant recurrent effusion.  Pulmonary and cardiology teams were consulted.  Initially patient was started on Cardizem drip which was slowly transitioned to p.o. hospital course has been complicated by acute kidney injury.   Assessment & Plan:  Active Problems:   Adenocarcinoma of left lung, stage 4 (HCC)   HTN (hypertension)   CHF (congestive heart failure), NYHA class III, chronic, diastolic (HCC)   COPD (chronic obstructive pulmonary disease) (HCC)   GERD   Acute respiratory failure with hypoxia (HCC)   MGUS (monoclonal gammopathy of unknown significance)   Acute congestive heart failure (HCC)   Chronic kidney disease, stage 3b (HCC)   Atrial fibrillation with RVR (HCC)   Acute on chronic hypoxic respiratory distress -At baseline uses 2 L nasal cannula, currently on 5 L nasal cannula.  Multifactorial, CTA showing multiloculated possible effusion. -Pleural fluid Gram stain negative, respiratory panel, negative   Moderate left-sided pleural effusion - Status post thoracentesis by pulmonary 450cc removed. -Status post thoracentesis by pulmonary 12/7, 450 cc removed.  Per pulmonary patient is on 5 days of Rocephin/azithromycin 5 days IV Solu-Medrol added by pulmonary. - I-S/flutter valve, bronchodilators as needed. Holding diuretics due to AKI   Atrial fibrillation with RVR - off Cardizem drip.  On metoprolol 75 mg twice daily. Cardiology following.    Acute congestive heart failure  with preserved ejection fraction, 70%.  Class IV - Exacerbated by atrial fibrillation with RVR. Echocardiogram shows EF 65% with concentric LVH.  Holding off on diuretics today due to AKI.    Acute kidney injury on CKD stage IIIb Hyperkalemia, potassium 5.5 - Creatinine baseline 1.6, creatinine still slightly trended up to 2.3 > 2.8 -bladder scan ok.  Hold nephrotoxic drugs including Lasix.  If this continues to worsen, we will have to get nephrology involved -Lokelma given  Anemia of chronic disease - Baseline hemoglobin 9.0, this morning 7.8.  Appears all the cell lines have become diluted.  No obvious evidence of bleeding.  Not on anticoagulation.   Stage IV nonsmall cell lung cancer - Follows with Dr. Earlie Server.  Currently on daily Tagrisso.  On hold per recent recommendation while hospitalized.   History of COPD - As needed bronchodilators   Anxiety - As needed Klonopin   Patient has multiple comorbidities and a stage IV lung cancer.  Overall she has poor prognosis.   DVT prophylaxis: SQ Hep Code Status: DNR Family Communication: None at bedside  Status is: Inpatient Remains inpatient appropriate because: Maintain stepdown stay while patient's heart rate still remains uncontrolled requiring IV diuretics and may need to start Cardizem drip if necessary.         Body mass index is 28.73 kg/m.  Pressure Injury 07/29/2022 Buttocks Bilateral Deep Tissue Pressure Injury - Purple or maroon localized area of discolored intact skin or blood-filled blister due to damage of underlying soft tissue from pressure and/or shear. Deep tissue injury with stage  (Active)  06/30/2022 1730  Location: Buttocks  Location Orientation: Bilateral  Staging: Deep Tissue Pressure Injury -  Purple or maroon localized area of discolored intact skin or blood-filled blister due to damage of underlying soft tissue from pressure and/or shear.  Wound Description (Comments): Deep tissue injury with stage 2   Present on Admission: Yes        Subjective: Patient seen and examined at bedside.  At rest she feels okay but anytime she moves around her heart rate jumps up to 110-120 and atrial fibrillation.  Still remains on 4-5 L nasal cannula.   Examination:  Constitutional: Appears chronically ill on 5 L nasal cannula Respiratory: Mild bilateral rhonchi Cardiovascular: Irregularly irregular Abdomen: Nontender nondistended good bowel sounds Musculoskeletal: Trace bilateral lower extremity edema Skin: No rashes seen Neurologic: CN 2-12 grossly intact.  And nonfocal Psychiatric: Normal judgment and insight. Alert and oriented x 3. Normal mood.  Objective: Vitals:   07/07/22 0402 07/07/22 0500 07/07/22 0600 07/07/22 0700  BP:   (!) 108/53 (!) 109/51  Pulse: 92  93 99  Resp: 11  10 12   Temp: (!) 96.8 F (36 C)   98 F (36.7 C)  TempSrc: Axillary   Oral  SpO2: 99%  98% 97%  Weight:  78.3 kg    Height:        Intake/Output Summary (Last 24 hours) at 07/07/2022 0726 Last data filed at 07/07/2022 0451 Gross per 24 hour  Intake 1160.57 ml  Output 750 ml  Net 410.57 ml   Filed Weights   07/06/22 0500 07/06/22 1015 07/07/22 0500  Weight: 74.4 kg 40.6 kg 78.3 kg     Data Reviewed:   CBC: Recent Labs  Lab 07/03/22 0931 07/28/2022 1230 07/06/22 0319 07/07/22 0232  WBC 16.3* 14.4* 16.2* 11.3*  NEUTROABS 13.9* 12.5*  --   --   HGB 9.3* 9.0* 8.5* 7.8*  HCT 29.1* 29.6* 28.6* 26.5*  MCV 82.9 85.3 87.5 87.5  PLT 262 248 246 546   Basic Metabolic Panel: Recent Labs  Lab 07/03/22 0931 07/06/2022 1230 07/06/22 0319 07/07/22 0232  NA 136 135 131* 135  K 4.0 4.8 4.7 5.5*  CL 96* 99 96* 102  CO2 31 28 23 23   GLUCOSE 189* 166* 132* 130*  BUN 49* 56* 51* 62*  CREATININE 1.67* 2.14* 2.35* 2.80*  CALCIUM 10.4* 9.9 9.2 9.9  MG  --   --  2.3 2.3   GFR: Estimated Creatinine Clearance: 16.3 mL/min (A) (by C-G formula based on SCr of 2.8 mg/dL (H)). Liver Function Tests: Recent  Labs  Lab 07/03/22 0931 07/16/2022 1811  AST 19  --   ALT 11  --   ALKPHOS 92  --   BILITOT 0.4  --   PROT 7.7 7.8  ALBUMIN 3.4*  --    No results for input(s): "LIPASE", "AMYLASE" in the last 168 hours. No results for input(s): "AMMONIA" in the last 168 hours. Coagulation Profile: No results for input(s): "INR", "PROTIME" in the last 168 hours. Cardiac Enzymes: No results for input(s): "CKTOTAL", "CKMB", "CKMBINDEX", "TROPONINI" in the last 168 hours. BNP (last 3 results) No results for input(s): "PROBNP" in the last 8760 hours. HbA1C: No results for input(s): "HGBA1C" in the last 72 hours. CBG: Recent Labs  Lab 07/06/22 1607  GLUCAP 99   Lipid Profile: No results for input(s): "CHOL", "HDL", "LDLCALC", "TRIG", "CHOLHDL", "LDLDIRECT" in the last 72 hours. Thyroid Function Tests: No results for input(s): "TSH", "T4TOTAL", "FREET4", "T3FREE", "THYROIDAB" in the last 72 hours. Anemia Panel: No results for input(s): "VITAMINB12", "FOLATE", "FERRITIN", "TIBC", "IRON", "RETICCTPCT" in the last  72 hours. Sepsis Labs: Recent Labs  Lab 07/03/22 1216 07/02/2022 1811 07/06/22 0319 07/07/22 0232  PROCALCITON  --  0.29 0.57 0.77  LATICACIDVEN 0.9  --   --   --     Recent Results (from the past 240 hour(s))  Culture, blood (routine x 2)     Status: None (Preliminary result)   Collection Time: 07/03/22 11:58 AM   Specimen: BLOOD RIGHT HAND  Result Value Ref Range Status   Specimen Description   Final    BLOOD RIGHT HAND Performed at [facility], [place] [address], [place], [place] [address]    Special Requests   Final    BOTTLES DRAWN AEROBIC AND ANAEROBIC Blood Culture results may not be optimal due to an inadequate volume of blood received in culture bottles Performed at [facility] [address], [place], [place] [address]    Culture   Final    NO GROWTH 3 DAYS Performed at [facility], [place] [address] [place], [place]  [address]    Report Status PENDING  Incomplete  Culture, blood (routine x 2)     Status: None (Preliminary result)   Collection Time: 07/03/22 12:03 PM   Specimen: BLOOD  Result Value Ref Range Status   Specimen Description   Final    BLOOD LEFT ANTECUBITAL Performed at [facility] [address], [place], [place] [address]    Special Requests   Final    BOTTLES DRAWN AEROBIC AND ANAEROBIC Blood Culture results may not be optimal due to an inadequate volume of blood received in culture bottles Performed at [facility] [address] [place], [place] [address]    Culture   Final    NO GROWTH 3 DAYS Performed at [facility], [place] [address], [place], [place] [address]    Report Status PENDING  Incomplete  Resp Panel by RT-PCR (Flu A&B, Covid) Anterior Nasal Swab     Status: None   Collection Time: 07/03/22 12:23 PM   Specimen: Anterior Nasal Swab  Result Value Ref Range Status   SARS Coronavirus 2 by RT PCR NEGATIVE NEGATIVE Final    Comment: (NOTE) SARS-CoV-2 target nucleic acids are NOT DETECTED.  The SARS-CoV-2 RNA is generally detectable in upper respiratory specimens during the acute phase of infection. The lowest concentration of SARS-CoV-2 viral copies this assay can detect is 138 copies/mL. A negative result does not preclude SARS-Cov-2 infection and should not be used as the sole basis for treatment or other patient management decisions. A negative result may occur with  improper specimen collection/handling, submission of specimen other than nasopharyngeal swab, presence of viral mutation(s) within the areas targeted by this assay, and inadequate number of viral copies(<138 copies/mL). A negative result must be combined with clinical observations, patient history, and epidemiological information. The expected result is Negative.  Fact Sheet for Patients:  EntrepreneurPulse.com.au  Fact Sheet for  Healthcare Providers:  IncredibleEmployment.be  This test is no t yet approved or cleared by the [place] FDA and  has been authorized for detection and/or diagnosis of SARS-CoV-2 by FDA under an Emergency Use Authorization (EUA). This EUA will remain  in effect (meaning this test can be used) for the duration of the COVID-19 declaration under Section 564(b)(1) of the Act, 21 U.S.C.section 360bbb-3(b)(1), unless the authorization is terminated  or revoked sooner.       Influenza A by PCR NEGATIVE NEGATIVE Final   Influenza B  by PCR NEGATIVE NEGATIVE Final    Comment: (NOTE) The Xpert Xpress SARS-CoV-2/FLU/RSV plus assay is intended as an aid in the diagnosis of influenza from Nasopharyngeal swab specimens and should not be used as a sole basis for treatment. Nasal washings and aspirates are unacceptable for Xpert Xpress SARS-CoV-2/FLU/RSV testing.  Fact Sheet for Patients: EntrepreneurPulse.com.au  Fact Sheet for Healthcare Providers: IncredibleEmployment.be  This test is not yet approved or cleared by the [place] FDA and has been authorized for detection and/or diagnosis of SARS-CoV-2 by FDA under an Emergency Use Authorization (EUA). This EUA will remain in effect (meaning this test can be used) for the duration of the COVID-19 declaration under Section 564(b)(1) of the Act, 21 U.S.C. section 360bbb-3(b)(1), unless the authorization is terminated or revoked.  Performed at [facility], [place] [address], [place], [place] [address]   Pleural fluid culture w Gram Stain     Status: None (Preliminary result)   Collection Time: 07/07/2022  4:12 PM   Specimen: Pleural Fluid  Result Value Ref Range Status   Specimen Description   Final    PLEURAL Performed at [facility] [address], [place], [place] [address]    Special Requests   Final    NONE Performed at [facility], [place] [address] [place], [place] [address]    Gram Stain   Final    FEW WBC PRESENT,BOTH PMN AND MONONUCLEAR NO ORGANISMS SEEN    Culture   Final    NO GROWTH < 12 HOURS Performed at [facility], [place] [address], [place], [place] [address]    Report Status PENDING  Incomplete  Respiratory (~20 pathogens) panel by PCR     Status: None   Collection Time: 07/21/2022  4:53 PM   Specimen: Nasopharyngeal Swab; Respiratory  Result Value Ref Range Status   Adenovirus NOT DETECTED NOT DETECTED Final   Coronavirus 229E NOT DETECTED NOT DETECTED Final    Comment: (NOTE) The Coronavirus on the Respiratory Panel, DOES NOT test for the novel  Coronavirus (2019 nCoV)    Coronavirus HKU1 NOT DETECTED NOT DETECTED Final   Coronavirus NL63 NOT DETECTED NOT DETECTED Final   Coronavirus OC43 NOT DETECTED NOT DETECTED Final   Metapneumovirus NOT DETECTED NOT DETECTED Final   Rhinovirus / Enterovirus NOT DETECTED NOT DETECTED Final   Influenza A NOT DETECTED NOT DETECTED Final   Influenza B NOT DETECTED NOT DETECTED Final   Parainfluenza Virus 1 NOT DETECTED NOT DETECTED Final   Parainfluenza Virus 2 NOT DETECTED NOT DETECTED Final   Parainfluenza Virus 3 NOT DETECTED NOT DETECTED Final   Parainfluenza Virus 4 NOT DETECTED NOT DETECTED Final   Respiratory Syncytial Virus NOT DETECTED NOT DETECTED Final   Bordetella pertussis NOT DETECTED NOT DETECTED Final   Bordetella Parapertussis NOT DETECTED NOT DETECTED Final   Chlamydophila pneumoniae NOT DETECTED NOT DETECTED Final   Mycoplasma pneumoniae NOT DETECTED NOT DETECTED Final    Comment: Performed at [facility], [place]. [address], [place], [place] [address]         Radiology Studies: ECHOCARDIOGRAM COMPLETE  Result Date: 07/06/2022    ECHOCARDIOGRAM REPORT   Patient Name:   FLORENTINE DIEKMAN Jhaveri Date of Exam: 07/06/2022 Medical Rec #:  [id number]       Height:       65.0 in Accession #:    [id number]      Weight:        89.5  lb Date of Birth:  01/22/1941      BSA:          1.406 m Patient Age:    55 years        BP:           138/66 mmHg Patient Gender: F               HR:           106 bpm. Exam Location:  Inpatient Procedure: 2D Echo, Color Doppler and Cardiac Doppler Indications:    I42.9 Cardiomyopathy (unspecified)  History:        Patient has prior history of Echocardiogram examinations, most                 recent 11/04/2021. CHF, COPD; Risk Factors:Hypertension,                 Dyslipidemia, Sleep Apnea and Stage IV Lung Cancer.  Sonographer:    Raquel Sarna Senior RDCS Referring Phys: [phone] [facility] [name] Duhamel  Sonographer Comments: Technically difficult study, unable to get apical windows due to patient discomfort and reduced skin integrity. IMPRESSIONS  1. Left ventricular ejection fraction, by estimation, is 65 to 70%. The left ventricle has normal function. The left ventricle has no regional wall motion abnormalities. There is moderate concentric left ventricular hypertrophy. Left ventricular diastolic function could not be evaluated.  2. Right ventricular systolic function was not well visualized. The right ventricular size is not well visualized. There is moderately elevated pulmonary artery systolic pressure.  3. The mitral valve is grossly normal. Mild to moderate mitral valve regurgitation. No evidence of mitral stenosis.  4. The aortic valve is tricuspid. There is mild calcification of the aortic valve. There is mild thickening of the aortic valve. Aortic valve regurgitation is trivial. Aortic valve sclerosis/calcification is present, without any evidence of aortic stenosis.  5. The inferior vena cava is dilated in size with <50% respiratory variability, suggesting right atrial pressure of 15 mmHg. Comparison(s): No significant change from prior study. Conclusion(s)/Recommendation(s): Normal LVEF. Unable to assess RV well as patient could not tolerate apical images. FINDINGS  Left Ventricle: Left ventricular ejection  fraction, by estimation, is 65 to 70%. The left ventricle has normal function. The left ventricle has no regional wall motion abnormalities. The left ventricular internal cavity size was normal in size. There is  moderate concentric left ventricular hypertrophy. Left ventricular diastolic function could not be evaluated due to atrial fibrillation. Left ventricular diastolic function could not be evaluated. Right Ventricle: The right ventricular size is not well visualized. Right vetricular wall thickness was not well visualized. Right ventricular systolic function was not well visualized. There is moderately elevated pulmonary artery systolic pressure. The  tricuspid regurgitant velocity is 3.22 m/s, and with an assumed right atrial pressure of 15 mmHg, the estimated right ventricular systolic pressure is 38.1 mmHg. Left Atrium: Left atrial size was not well visualized. Right Atrium: Right atrial size was not well visualized. Pericardium: There is no evidence of pericardial effusion. Mitral Valve: The mitral valve is grossly normal. Mild mitral annular calcification. Mild to moderate mitral valve regurgitation. No evidence of mitral valve stenosis. Tricuspid Valve: The tricuspid valve is normal in structure. Tricuspid valve regurgitation is mild . No evidence of tricuspid stenosis. Aortic Valve: The aortic valve is tricuspid. There is mild calcification of the aortic valve. There is mild thickening of the aortic valve. Aortic valve regurgitation is trivial. Aortic valve sclerosis/calcification is present, without any evidence  of aortic stenosis. Pulmonic Valve: The pulmonic valve was not well visualized. Pulmonic valve regurgitation is not visualized. Aorta: The aortic root and ascending aorta are structurally normal, with no evidence of dilitation. Venous: The inferior vena cava is dilated in size with less than 50% respiratory variability, suggesting right atrial pressure of 15 mmHg. IAS/Shunts: The interatrial  septum was not well visualized.  LEFT VENTRICLE PLAX 2D LVIDd:         4.30 cm LVIDs:         3.00 cm LV PW:         1.68 cm LV IVS:        1.63 cm LVOT diam:     2.00 cm LVOT Area:     3.14 cm  LEFT ATRIUM         Index LA diam:    4.30 cm 3.06 cm/m   AORTA Ao Root diam: 2.70 cm Ao Asc diam:  2.90 cm TRICUSPID VALVE TR Peak grad:   41.5 mmHg TR Vmax:        322.00 cm/s  SHUNTS Systemic Diam: 2.00 cm Buford Dresser MD Electronically signed by Buford Dresser MD Signature Date/Time: 07/06/2022/4:46:47 PM    Final    DG Chest Port 1 View  Result Date: 07/07/2022 CLINICAL DATA:  Status post left thoracentesis. EXAM: PORTABLE CHEST 1 VIEW COMPARISON:  Same day. FINDINGS: No pneumothorax is noted status post left-sided thoracentesis. Left pleural effusion is significantly smaller. IMPRESSION: No pneumothorax status post left thoracentesis. Electronically Signed   By: Marijo Conception M.D.   On: 07/17/2022 16:49   DG Chest Port 1 View  Result Date: 07/01/2022 CLINICAL DATA:  Shortness of breath EXAM: PORTABLE CHEST 1 VIEW COMPARISON:  Previous studies including the examination done on 07/03/2022 FINDINGS: Transverse diameter of heart is increased. Central pulmonary vessels are prominent. Increased interstitial and alveolar markings are seen in parahilar regions and lower lung fields. Left hemidiaphragm is elevated. Small to moderate bilateral pleural effusions are seen, more so on the left side. There is interval increase in amount of left pleural effusion. There is no pneumothorax. IMPRESSION: Cardiomegaly. CHF. Small to moderate bilateral pleural effusions, more so on the left side with interval increase. Electronically Signed   By: Elmer Picker M.D.   On: 07/18/2022 13:18        Scheduled Meds:  azithromycin  500 mg Oral Daily   Chlorhexidine Gluconate Cloth  6 each Topical Daily   ferrous sulfate  325 mg Oral BID WC   heparin injection (subcutaneous)  5,000 Units Subcutaneous Q8H    ipratropium  0.5 mg Nebulization Q6H   methylPREDNISolone (SOLU-MEDROL) injection  40 mg Intravenous Daily   metoprolol tartrate  75 mg Oral BID   nystatin   Topical BID   sodium zirconium cyclosilicate  10 g Oral TID   Continuous Infusions:  cefTRIAXone (ROCEPHIN)  IV Stopped (07/06/22 1810)     LOS: 2 days   Time spent= 35 mins    Almadelia Looman Arsenio Loader, MD [facility]  If 7PM-7AM, please contact night-coverage  07/07/2022, 7:26 AM

## 2022-07-07 NOTE — Plan of Care (Signed)
  Problem: Education: Goal: Ability to demonstrate management of disease process will improve Outcome: Not Progressing Goal: Ability to verbalize understanding of medication therapies will improve Outcome: Not Progressing Goal: Individualized Educational Video(s) Outcome: Not Progressing   Problem: Activity: Goal: Capacity to carry out activities will improve Outcome: Not Progressing   Problem: Cardiac: Goal: Ability to achieve and maintain adequate cardiopulmonary perfusion will improve Outcome: Not Progressing   Problem: Education: Goal: Knowledge of disease or condition will improve Outcome: Not Progressing Goal: Understanding of medication regimen will improve Outcome: Not Progressing Goal: Individualized Educational Video(s) Outcome: Not Progressing   Problem: Activity: Goal: Ability to tolerate increased activity will improve Outcome: Not Progressing   Problem: Cardiac: Goal: Ability to achieve and maintain adequate cardiopulmonary perfusion will improve Outcome: Not Progressing   Problem: Health Behavior/Discharge Planning: Goal: Ability to safely manage health-related needs after discharge will improve Outcome: Not Progressing   Problem: Education: Goal: Knowledge of General Education information will improve Description: Including pain rating scale, medication(s)/side effects and non-pharmacologic comfort measures Outcome: Not Progressing   Problem: Clinical Measurements: Goal: Ability to maintain clinical measurements within normal limits will improve Outcome: Not Progressing Goal: Will remain free from infection Outcome: Not Progressing Goal: Diagnostic test results will improve Outcome: Not Progressing Goal: Respiratory complications will improve Outcome: Not Progressing Goal: Cardiovascular complication will be avoided Outcome: Not Progressing   Problem: Activity: Goal: Risk for activity intolerance will decrease Outcome: Not Progressing    Problem: Nutrition: Goal: Adequate nutrition will be maintained Outcome: Not Progressing   Problem: Nutrition: Goal: Adequate nutrition will be maintained Outcome: Not Progressing   Problem: Coping: Goal: Level of anxiety will decrease Outcome: Not Progressing   Problem: Elimination: Goal: Will not experience complications related to bowel motility Outcome: Not Progressing Goal: Will not experience complications related to urinary retention Outcome: Not Progressing   Problem: Pain Managment: Goal: General experience of comfort will improve Outcome: Not Progressing   Problem: Safety: Goal: Ability to remain free from injury will improve Outcome: Not Progressing   Problem: Skin Integrity: Goal: Risk for impaired skin integrity will decrease Outcome: Not Progressing

## 2022-07-08 DIAGNOSIS — I5043 Acute on chronic combined systolic (congestive) and diastolic (congestive) heart failure: Secondary | ICD-10-CM

## 2022-07-08 DIAGNOSIS — R0603 Acute respiratory distress: Secondary | ICD-10-CM

## 2022-07-08 DIAGNOSIS — Z515 Encounter for palliative care: Secondary | ICD-10-CM

## 2022-07-08 LAB — CULTURE, BLOOD (ROUTINE X 2)
Culture: NO GROWTH
Culture: NO GROWTH

## 2022-07-08 LAB — LEGIONELLA PNEUMOPHILA SEROGP 1 UR AG: L. pneumophila Serogp 1 Ur Ag: NEGATIVE

## 2022-07-08 LAB — BASIC METABOLIC PANEL
Anion gap: 10 (ref 5–15)
BUN: 72 mg/dL — ABNORMAL HIGH (ref 8–23)
CO2: 26 mmol/L (ref 22–32)
Calcium: 10.5 mg/dL — ABNORMAL HIGH (ref 8.9–10.3)
Chloride: 102 mmol/L (ref 98–111)
Creatinine, Ser: 2.62 mg/dL — ABNORMAL HIGH (ref 0.44–1.00)
GFR, Estimated: 18 mL/min — ABNORMAL LOW (ref >60)
Glucose, Bld: 157 mg/dL — ABNORMAL HIGH (ref 70–99)
Potassium: 4.4 mmol/L (ref 3.5–5.1)
Sodium: 138 mmol/L (ref 135–145)

## 2022-07-08 LAB — CBC
HCT: 27.2 % — ABNORMAL LOW (ref 36.0–46.0)
Hemoglobin: 8.1 g/dL — ABNORMAL LOW (ref 12.0–15.0)
MCH: 25.2 pg — ABNORMAL LOW (ref 26.0–34.0)
MCHC: 29.8 g/dL — ABNORMAL LOW (ref 30.0–36.0)
MCV: 84.5 fL (ref 80.0–100.0)
Platelets: 252 10*3/uL (ref 150–400)
RBC: 3.22 MIL/uL — ABNORMAL LOW (ref 3.87–5.11)
RDW: 14.1 % (ref 11.5–15.5)
WBC: 12.2 10*3/uL — ABNORMAL HIGH (ref 4.0–10.5)
nRBC: 0 % (ref 0.0–0.2)

## 2022-07-08 LAB — MAGNESIUM: Magnesium: 2.5 mg/dL — ABNORMAL HIGH (ref 1.7–2.4)

## 2022-07-08 MED ORDER — MAGIC MOUTHWASH: 15.0000 mL | Freq: Four times a day (QID) | ORAL | Status: DC | PRN

## 2022-07-08 MED ORDER — ATROPINE SULFATE 1 % OP SOLN: 4.0000 [drp] | OPHTHALMIC | Status: DC | PRN

## 2022-07-08 MED ORDER — POLYVINYL ALCOHOL 1.4 % OP SOLN: 1.0000 [drp] | Freq: Four times a day (QID) | OPHTHALMIC | Status: DC | PRN

## 2022-07-08 MED ORDER — HALOPERIDOL LACTATE 2 MG/ML PO CONC: 0.5000 mg | ORAL | Status: DC | PRN

## 2022-07-08 MED ORDER — LORAZEPAM 2 MG/ML IJ SOLN
1.0000 mg | INTRAMUSCULAR | Status: DC | PRN
  Administered 2022-07-08: 1 mg via INTRAVENOUS
  Filled 2022-07-08 (×2): qty 1

## 2022-07-08 MED ORDER — MORPHINE SULFATE (PF) 2 MG/ML IV SOLN
2.0000 mg | INTRAVENOUS | Status: DC | PRN
  Administered 2022-07-08 – 2022-07-09 (×3): 2 mg via INTRAVENOUS
  Filled 2022-07-08: qty 1

## 2022-07-08 MED ORDER — HALOPERIDOL 1 MG PO TABS: 0.5000 mg | ORAL_TABLET | ORAL | Status: DC | PRN

## 2022-07-08 MED ORDER — TEMAZEPAM 15 MG PO CAPS: 15.0000 mg | ORAL_CAPSULE | Freq: Every evening | ORAL | Status: DC | PRN

## 2022-07-08 MED ORDER — DIPHENHYDRAMINE HCL 50 MG/ML IJ SOLN: 12.5000 mg | INTRAMUSCULAR | Status: DC | PRN

## 2022-07-08 MED ORDER — LORAZEPAM 1 MG PO TABS: 1.0000 mg | ORAL_TABLET | ORAL | Status: DC | PRN

## 2022-07-08 MED ORDER — ONDANSETRON 4 MG PO TBDP: 4.0000 mg | ORAL_TABLET | Freq: Four times a day (QID) | ORAL | Status: DC | PRN

## 2022-07-08 MED ORDER — LORAZEPAM 2 MG/ML PO CONC: 1.0000 mg | ORAL | Status: DC | PRN

## 2022-07-08 MED ORDER — ONDANSETRON HCL 4 MG/2ML IJ SOLN: 4.0000 mg | Freq: Four times a day (QID) | INTRAMUSCULAR | Status: DC | PRN

## 2022-07-08 MED ORDER — MORPHINE 100MG IN NS 100ML (1MG/ML) PREMIX INFUSION
5.0000 mg/h | INTRAVENOUS | Status: DC
  Administered 2022-07-08: 2 mg/h via INTRAVENOUS
  Filled 2022-07-08: qty 100

## 2022-07-08 MED ORDER — MORPHINE SULFATE (CONCENTRATE) 10 MG/0.5ML PO SOLN
5.0000 mg | ORAL | Status: DC | PRN
  Administered 2022-07-08: 5 mg via SUBLINGUAL
  Filled 2022-07-08: qty 0.5

## 2022-07-08 MED ORDER — HALOPERIDOL LACTATE 5 MG/ML IJ SOLN: 0.5000 mg | INTRAMUSCULAR | Status: DC | PRN

## 2022-07-08 MED ORDER — MORPHINE SULFATE (CONCENTRATE) 10 MG/0.5ML PO SOLN: 5.0000 mg | ORAL | Status: DC | PRN

## 2022-07-08 NOTE — Progress Notes (Signed)
Patient's son, [name], and one of the patient's nieces at bedside exited the patient's room and began to have a loud, hostile argument in the hall. The patient's son was asked to leave the unit by this nurse. Discussed with family that the son should be allowed to see his mother, but that current family members should step out of the room when he is visiting. Family at bedside (patient's daughter [name], nieces, etc.) gave their verbal agreement to this. Informed charge nurse, Pamala Hurry, RN of the situation for continuity of care.

## 2022-07-08 NOTE — Progress Notes (Signed)
Progress Note  Patient Name: [name] Date of Encounter: 07/08/2022  [facility] Cardiologist: Larae Grooms, MD   Subjective   Events of yesterday noted>>CODE stroke called for left sided weakness and facial droop Head CT with no acute CVA and MRI pending O2 sats 98% on 15L NRB  Put out 350cc UOP yesterday and still 2.8L +   Inpatient Medications    Scheduled Meds:  azithromycin  500 mg Oral Daily   Chlorhexidine Gluconate Cloth  6 each Topical Daily   ferrous sulfate  325 mg Oral BID WC   heparin injection (subcutaneous)  5,000 Units Subcutaneous Q8H   ipratropium  0.5 mg Nebulization Q6H   methylPREDNISolone (SOLU-MEDROL) injection  40 mg Intravenous Daily   metoprolol tartrate  100 mg Oral BID   nystatin   Topical BID   Continuous Infusions:  cefTRIAXone (ROCEPHIN)  IV Stopped (07/07/22 1741)   PRN Meds: acetaminophen, clonazePAM, guaiFENesin, ipratropium-albuterol, metoprolol tartrate, ondansetron (ZOFRAN) IV, oxyCODONE, senna-docusate, traZODone   Vital Signs    Vitals:   07/08/22 0600 07/08/22 0700 07/08/22 0752 07/08/22 0800  BP: (!) 105/38 134/66  139/60  Pulse: 70 66 83 75  Resp: 11 (!) 8 (!) 9 (!) 8  Temp:      TempSrc:      SpO2: 100% 99% 100% 98%  Weight:      Height:        Intake/Output Summary (Last 24 hours) at 07/08/2022 0955 Last data filed at 07/07/2022 1800 Gross per 24 hour  Intake 280 ml  Output 350 ml  Net -70 ml       07/08/2022    5:00 AM 07/07/2022    5:00 AM 07/06/2022   10:15 AM  Last 3 Weights  Weight (lbs) 162 lb 0.6 oz 172 lb 9.9 oz 89 lb 8.1 oz  Weight (kg) 73.5 kg 78.3 kg 40.6 kg      Telemetry    Atrial fibrillation - Personally Reviewed  ECG    No new EKG to review - Personally Reviewed  Physical Exam   GEN: elderly and ill appearing HEENT: Normal NECK: No JVD; No carotid bruits LYMPHATICS: No lymphadenopathy CARDIAC:irregularly irregular, no murmurs, rubs, gallops RESPIRATORY:  Clear  to auscultation without rales, wheezing or rhonchi  ABDOMEN: Soft, non-tender, non-distended MUSCULOSKELETAL:  No edema; No deformity  SKIN: Warm and dry NEUROLOGIC:  Alert and oriented x 3 PSYCHIATRIC:  Normal affect  Labs    High Sensitivity Troponin:  No results for input(s): "TROPONINIHS" in the last 720 hours.    Chemistry Recent Labs  Lab 07/03/22 0931 07/03/2022 1230 07/04/2022 1811 07/06/22 0319 07/07/22 0232 07/08/22 0245  NA 136   < >  --  131* 135 138  K 4.0   < >  --  4.7 5.5* 4.4  CL 96*   < >  --  96* 102 102  CO2 31   < >  --  23 23 26   GLUCOSE 189*   < >  --  132* 130* 157*  BUN 49*   < >  --  51* 62* 72*  CREATININE 1.67*   < >  --  2.35* 2.80* 2.62*  CALCIUM 10.4*   < >  --  9.2 9.9 10.5*  PROT 7.7  --  7.8  --   --   --   ALBUMIN 3.4*  --   --   --   --   --   AST 19  --   --   --   --   --  ALT 11  --   --   --   --   --   ALKPHOS 92  --   --   --   --   --   BILITOT 0.4  --   --   --   --   --   GFRNONAA 31*   < >  --  20* 16* 18*  ANIONGAP 9   < >  --  12 10 10    < > = values in this interval not displayed.      Hematology Recent Labs  Lab 07/06/22 0319 07/07/22 0232 07/08/22 0245  WBC 16.2* 11.3* 12.2*  RBC 3.27* 3.03* 3.22*  HGB 8.5* 7.8* 8.1*  HCT 28.6* 26.5* 27.2*  MCV 87.5 87.5 84.5  MCH 26.0 25.7* 25.2*  MCHC 29.7* 29.4* 29.8*  RDW 14.4 14.6 14.1  PLT 246 232 252     BNP Recent Labs  Lab 07/03/22 1158 06/29/2022 1230  BNP 238.4* 307.6*      DDimer No results for input(s): "DDIMER" in the last 168 hours.   CHA2DS2-VASc Score = 5   This indicates a 7.2% annual risk of stroke. The patient's score is based upon: CHF History: 1 HTN History: 1 Diabetes History: 0 Stroke History: 0 Vascular Disease History: 0 Age Score: 2 Gender Score: 1      Radiology    US RENAL  Result Date: 07/07/2022 CLINICAL DATA:  Acute kidney injury EXAM: RENAL / URINARY TRACT ULTRASOUND COMPLETE COMPARISON:  CT abdomen and pelvis dated  04/24/2022 FINDINGS: Right Kidney: Length = 12.5 cm Normal parenchymal echogenicity with preserved corticomedullary differentiation. Thick-walled cystic lesion along the posterior right kidney measures 4.1 x 4.0 cm, similar to slightly increased in size from prior CT when it measured 4.0 x 3.6 cm. Multiple additional simple-appearing renal cysts measuring up to 3.3 cm. No urinary tract dilation or shadowing calculi. The ureter is not seen. Left Kidney: Length = 12.5 cm Normal parenchymal echogenicity with preserved corticomedullary differentiation. Multiple simple-appearing cysts measuring up to 0.9 cm. No urinary tract dilation or shadowing calculi. The ureter is not seen. Bladder: Appears normal for degree of bladder distention. Other: Bilateral pleural effusions. IMPRESSION: 1. No evidence of obstructive uropathy. 2. Thick-walled cystic lesion along the posterior right kidney measures up to 4.1 cm, similar to slightly increased in size from prior CT when it measured up to 4.0 cm, remains suspicious for primary renal neoplasm. 3. Bilateral pleural effusions. Electronically Signed   By: Darrin Nipper M.D.   On: 07/07/2022 17:18   CT HEAD CODE STROKE WO CONTRAST  Addendum Date: 07/07/2022   ADDENDUM REPORT: 07/07/2022 15:22 ADDENDUM: Discussed with Dr. Reesa Chew at 1522 hours. Electronically Signed   By: Nelson Chimes M.D.   On: 07/07/2022 15:22   Result Date: 07/07/2022 CLINICAL DATA:  Code stroke. Neuro deficit, acute, stroke suspected. Left arm weakness. EXAM: CT HEAD WITHOUT CONTRAST TECHNIQUE: Contiguous axial images were obtained from the base of the skull through the vertex without intravenous contrast. RADIATION DOSE REDUCTION: This exam was performed according to the departmental dose-optimization program which includes automated exposure control, adjustment of the mA and/or kV according to patient size and/or use of iterative reconstruction technique. COMPARISON:  MRI 01/17/2022 FINDINGS: Brain: No focal  abnormality seen affecting the brainstem or cerebellum. Cerebral hemispheres show a chronic dilated perivascular space at the base of the brain on the right and mild chronic small-vessel ischemic change of the cerebral hemispheric white matter. No sign of acute  infarction, mass lesion, hemorrhage, hydrocephalus or extra-axial collection. Vascular: There is atherosclerotic calcification of the major vessels at the base of the brain. Skull: Negative Sinuses/Orbits: Clear/normal Other: None ASPECTS ([place] Stroke Program Early CT Score) - Ganglionic level infarction (caudate, lentiform nuclei, internal capsule, insula, M1-M3 cortex): 7 - Supraganglionic infarction (M4-M6 cortex): 3 Total score (0-10 with 10 being normal): 10 IMPRESSION: 1. No acute CT finding. Mild chronic small-vessel ischemic change of the cerebral hemispheric white matter. 2. Aspects is 10. Call report in progress Electronically Signed: By: Nelson Chimes M.D. On: 07/07/2022 15:11   DG Chest 1 View  Result Date: 07/07/2022 CLINICAL DATA:  CHF. EXAM: CHEST  1 VIEW COMPARISON:  CXR 07/04/2022 FINDINGS: Increased left and new right-sided pleural effusion. No pneumothorax. Cardiac contours are poorly visualized. Mediastinal contours are unchanged compared to prior exam with persistent dense atherosclerotic calcification of the transverse aorta. There is interval increase in prominence of bilateral interstitial opacities, particularly at the bilateral lung bases, which most likely represents worsening pulmonary edema, but superimposed infection is difficult to exclude no displaced rib fractures. No acute osseous abnormality. Visualized upper abdomen is unremarkable. IMPRESSION: Increased left and new right-sided pleural effusion. There is interval increase in prominence of bilateral interstitial opacities, which most likely represents worsening pulmonary edema, but superimposed infection is difficult to exclude. Electronically Signed   By: Marin Roberts  M.D.   On: 07/07/2022 12:28   ECHOCARDIOGRAM COMPLETE  Result Date: 07/06/2022    ECHOCARDIOGRAM REPORT   Patient Name:   [name] Vanderveer Date of Exam: 07/06/2022 Medical Rec #:  [id number]       Height:       65.0 in Accession #:    [id number]      Weight:       89.5 lb Date of Birth:  21-Jul-1941      BSA:          1.406 m Patient Age:    26 years        BP:           138/66 mmHg Patient Gender: F               HR:           106 bpm. Exam Location:  Inpatient Procedure: 2D Echo, Color Doppler and Cardiac Doppler Indications:    I42.9 Cardiomyopathy (unspecified)  History:        Patient has prior history of Echocardiogram examinations, most                 recent 11/04/2021. CHF, COPD; Risk Factors:Hypertension,                 Dyslipidemia, Sleep Apnea and Stage IV Lung Cancer.  Sonographer:    Raquel Sarna Senior RDCS Referring Phys: [phone] [facility] AMIN  Sonographer Comments: Technically difficult study, unable to get apical windows due to patient discomfort and reduced skin integrity. IMPRESSIONS  1. Left ventricular ejection fraction, by estimation, is 65 to 70%. The left ventricle has normal function. The left ventricle has no regional wall motion abnormalities. There is moderate concentric left ventricular hypertrophy. Left ventricular diastolic function could not be evaluated.  2. Right ventricular systolic function was not well visualized. The right ventricular size is not well visualized. There is moderately elevated pulmonary artery systolic pressure.  3. The mitral valve is grossly normal. Mild to moderate mitral valve regurgitation. No evidence of mitral stenosis.  4. The aortic valve is tricuspid. There is  mild calcification of the aortic valve. There is mild thickening of the aortic valve. Aortic valve regurgitation is trivial. Aortic valve sclerosis/calcification is present, without any evidence of aortic stenosis.  5. The inferior vena cava is dilated in size with <50% respiratory variability,  suggesting right atrial pressure of 15 mmHg. Comparison(s): No significant change from prior study. Conclusion(s)/Recommendation(s): Normal LVEF. Unable to assess RV well as patient could not tolerate apical images. FINDINGS  Left Ventricle: Left ventricular ejection fraction, by estimation, is 65 to 70%. The left ventricle has normal function. The left ventricle has no regional wall motion abnormalities. The left ventricular internal cavity size was normal in size. There is  moderate concentric left ventricular hypertrophy. Left ventricular diastolic function could not be evaluated due to atrial fibrillation. Left ventricular diastolic function could not be evaluated. Right Ventricle: The right ventricular size is not well visualized. Right vetricular wall thickness was not well visualized. Right ventricular systolic function was not well visualized. There is moderately elevated pulmonary artery systolic pressure. The  tricuspid regurgitant velocity is 3.22 m/s, and with an assumed right atrial pressure of 15 mmHg, the estimated right ventricular systolic pressure is 69.4 mmHg. Left Atrium: Left atrial size was not well visualized. Right Atrium: Right atrial size was not well visualized. Pericardium: There is no evidence of pericardial effusion. Mitral Valve: The mitral valve is grossly normal. Mild mitral annular calcification. Mild to moderate mitral valve regurgitation. No evidence of mitral valve stenosis. Tricuspid Valve: The tricuspid valve is normal in structure. Tricuspid valve regurgitation is mild . No evidence of tricuspid stenosis. Aortic Valve: The aortic valve is tricuspid. There is mild calcification of the aortic valve. There is mild thickening of the aortic valve. Aortic valve regurgitation is trivial. Aortic valve sclerosis/calcification is present, without any evidence of aortic stenosis. Pulmonic Valve: The pulmonic valve was not well visualized. Pulmonic valve regurgitation is not visualized.  Aorta: The aortic root and ascending aorta are structurally normal, with no evidence of dilitation. Venous: The inferior vena cava is dilated in size with less than 50% respiratory variability, suggesting right atrial pressure of 15 mmHg. IAS/Shunts: The interatrial septum was not well visualized.  LEFT VENTRICLE PLAX 2D LVIDd:         4.30 cm LVIDs:         3.00 cm LV PW:         1.68 cm LV IVS:        1.63 cm LVOT diam:     2.00 cm LVOT Area:     3.14 cm  LEFT ATRIUM         Index LA diam:    4.30 cm 3.06 cm/m   AORTA Ao Root diam: 2.70 cm Ao Asc diam:  2.90 cm TRICUSPID VALVE TR Peak grad:   41.5 mmHg TR Vmax:        322.00 cm/s  SHUNTS Systemic Diam: 2.00 cm Buford Dresser MD Electronically signed by Buford Dresser MD Signature Date/Time: 07/06/2022/4:46:47 PM    Final     Cardiac Studies   2D echo 07/06/22 IMPRESSIONS    1. Left ventricular ejection fraction, by estimation, is 65 to 70%. The  left ventricle has normal function. The left ventricle has no regional  wall motion abnormalities. There is moderate concentric left ventricular  hypertrophy. Left ventricular  diastolic function could not be evaluated.   2. Right ventricular systolic function was not well visualized. The right  ventricular size is not well visualized. There is moderately elevated  pulmonary artery systolic pressure.   3. The mitral valve is grossly normal. Mild to moderate mitral valve  regurgitation. No evidence of mitral stenosis.   4. The aortic valve is tricuspid. There is mild calcification of the  aortic valve. There is mild thickening of the aortic valve. Aortic valve  regurgitation is trivial. Aortic valve sclerosis/calcification is present,  without any evidence of aortic  stenosis.   5. The inferior vena cava is dilated in size with <50% respiratory  variability, suggesting right atrial pressure of 15 mmHg.   Comparison(s): No significant change from prior study.   Patient Profile      81 y.o. female with a hx of chronic diastolic heart failure, history of GI bleed, PAF (not on AC due to GI bleed), sinus bradycardia, HTN, chronic SOB, OSA, Stage IV adenocarcinoma of lung  who is being seen 07/06/2022 for the evaluation of atrial fibrillation, CHF at the request of Dr. Reesa Chew.   Assessment & Plan    #Paroxysmal atrial fibrillation -According to office notes she has been in sinus rhythm for several years without a recurrence but now found to be back in atrial fibrillation with RVR -Has not been on anticoagulation due to history of recurrent GI bleeds hemoglobin is 8.5  -Lopressor increased to 147m BID yesterday for better HF control -She is not a candidate for AAD's or cardioversion due to inability to anticoagulate -HR currently controlled so will continue Lopressor 1074mBID   #Acute on chronic diastolic CHF -2D echo April 2023 showed EF 70 to 75% with moderate LVH and grade 2 diastolic dysfunction -Chest x-ray consistent with increased bilateral pleural effusions and BNP mildly elevated at 307 -Started on IV Lasix but serum creatinine  bumped from 2.1->>2.35 (baseline 1.5-2) likely related to cardiorenal syndrome with ATN from hypotension and A-fib with RVR as well as contrast nephropathy from CTA 12/5 -CXray yesterday with increase pleural effusion and interstitial edema   -nephrology saw yesterday and gave 609mV Lasix>>not good candidate for HD -she put out 350cc yesterday but unclear if I&O's are accurate>>SCr slightly improved from 2.8>>2.62 today -repeat 2D echo this admit with normal LVF with EF 65-70%, moderate PHTN, mild to moderate MR and RAP 82m19m-I think she remains volume overloaded -further diuretics per renal -Strict I's and O's and daily weights  #Bilateral pleural effusions/acute on chronic hypoxemic respiratory failure/AECOPD -Whether this is related to her lung CA or due to CHF -That is post left thoracentesis yesterday yielding 450 cc of fluid  exudative by analysis>> cytology pending -Currently on antibiotics  #Acute Neurologic changes -CODE stroke called yesterday for facial droop and left sided weakness -CT of head negative for acute CVA -Patient does not want MRI done -Palliative Care has been consulted to discuss comfort care    I have spent a total of 30 minutes with patient reviewing 2D echo , telemetry, EKGs, labs and examining patient as well as establishing an assessment and plan that was discussed with the patient.  > 50% of time was spent in direct patient care.    For questions or updates, please contact [facility] consult [email or web address] for contact info under        Signed, TracFransico Him  07/08/2022, 9:55 AM

## 2022-07-08 NOTE — Progress Notes (Signed)
  Daily Progress Note   Patient Name: [name]       Date: 07/08/2022 DOB: [birth year]  Age: 81 y.o. MRN#: [id number] Attending Physician: Damita Lack, MD Primary Care Physician: [name], MD Admit Date: 07/26/2022 Length of Stay: 3 days  Discussed care with primary hospitalist today. PMT consulted to assist with coordination regarding goals for medical care moving forward. Dr. Reesa Chew has already had a chance to speak with family this morning and decision has been made to transition to comfort focused care.  As goals for care have been determined, PMT consult will be canceled. Please reach out if our team is needed in the future.  Thank you for involving our team in patient's care.    Chelsea Aus, DO Palliative Care Provider PMT # [phone]

## 2022-07-08 NOTE — Progress Notes (Signed)
I was following for nephrology -- see patient/family has elected to proceed with comfort care which seems very reasonable.  I didn't see patient today.  Please call if I can further assist.   Jannifer Hick MD [place] Pager [phone]

## 2022-07-08 NOTE — Progress Notes (Signed)
Patient refusing care this AM (attempted to reposition patient in bed). Family (patient's sister) at bedside states patient's grand daughter, Ander Purpura is the main point of contact and patient's decision maker; per family the patient's husband has dementia. No official healthcare POA in patient's chart at this time.

## 2022-07-08 NOTE — Progress Notes (Signed)
PROGRESS NOTE    SHARONNE RICKETTS  OHY:[id number] DOB: 1940-11-28 DOA: 07/25/2022 PCP: [name], MD   Brief Narrative:   81 y.o. female with medical history significant of stage IV adenocarcinoma of lung followed by Dr. Earlie Server on [place], HTN, GERD, COPD, diastolic CHF, MGUS, osteopenia, chronic hypoxia on 2 L nasal cannula comes to the hospital for shortness of breath.  Patient admitted to the hospital for atrial fibrillation with RVR, acute kidney injury, CHF exacerbation, pleural effusion/malignant recurrent effusion.  Pulmonary and cardiology teams were consulted.  Initially patient was started on Cardizem drip which was slowly transitioned to p.o. hospital course has been complicated by acute kidney injury. During the hospitalization patient was also seen by nephrology team.  Hospital course was complicated by acute CVA/left-sided weakness requiring CT head which was negative.  Due to overall worsening in her condition, comorbidities, patient was transitioned to comfort care.   Assessment & Plan:  Active Problems:   Adenocarcinoma of left lung, stage 4 (HCC)   HTN (hypertension)   Acute on chronic diastolic heart failure (HCC)   Acute on chronic respiratory failure with hypoxemia (HCC)   COPD (chronic obstructive pulmonary disease) (HCC)   GERD   Acute respiratory failure with hypoxia (HCC)   MGUS (monoclonal gammopathy of unknown significance)   Acute congestive heart failure (HCC)   Chronic kidney disease, stage 3b (HCC)   Atrial fibrillation with RVR (HCC)   Acute on chronic hypoxic respiratory distress - Unfortunately her symptoms continue to worsen and appears to be aspirating.  She has not been transitioned to comfort care   Moderate left-sided pleural effusion - Likely malignant effusion.  Will discontinue antibiotics, steroids.  She has been transitioned to comfort care   Atrial fibrillation with RVR Acute congestive heart failure with preserved ejection  fraction, 70%.  Class IV Acute kidney injury on CKD stage IIIb Hyperkalemia, potassium 5.5 Anemia of chronic disease Stage IV nonsmall cell lung cancer History of COPD Anxiety Acute left-sided weakness, improved.  Transient ischemic attack   -Due to continued and rapid decline in patient's condition.  I met with family members at bedside including the daughters, granddaughters, sister.  Patient has now been transitioned to comfort care.   Patient has now been transitioned to comfort care.  Anticipate in-hospital death over next 24-48 hours. Body mass index is 26.96 kg/m.  Pressure Injury 07/13/2022 Buttocks Bilateral Deep Tissue Pressure Injury - Purple or maroon localized area of discolored intact skin or blood-filled blister due to damage of underlying soft tissue from pressure and/or shear. Deep tissue injury with stage  (Active)  07/14/2022 1730  Location: Buttocks  Location Orientation: Bilateral  Staging: Deep Tissue Pressure Injury - Purple or maroon localized area of discolored intact skin or blood-filled blister due to damage of underlying soft tissue from pressure and/or shear.  Wound Description (Comments): Deep tissue injury with stage 2  Present on Admission: Yes        Subjective: Yesterday patient had an episode of acute left-sided weakness concerning for CVA.  Her weakness is slightly better but clearly she has declined and continues to aspirate.  Currently remains on 15 L nasal cannula/nonrebreather at this time.  Family agrees that patient does have very poor quality of life and has rapidly declined.  They are in agreement that we should transition her to comfort care  Examination:  Constitutional: Patient acute appears grossly very ill.  On nonrebreather Respiratory: Diffuse bilateral rhonchi Cardiovascular: Irregularly irregular Abdomen: Nontender nondistended good  bowel sounds Musculoskeletal: Trace bilateral lower extremity edema Skin: No rashes  seen Neurologic: Unable to fully assess but clear left upper extremity weakness. Psychiatric: Unable to fully assess  Objective: Vitals:   07/08/22 0800 07/08/22 0900 07/08/22 1000 07/08/22 1100  BP: 139/60 (!) 148/42 (!) 137/49 (!) 146/54  Pulse: 75 75 67 81  Resp: (!) 8 (!) 8 10 (!) 9  Temp:      TempSrc:      SpO2: 98% 99% 97% 97%  Weight:      Height:        Intake/Output Summary (Last 24 hours) at 07/08/2022 1155 Last data filed at 07/07/2022 1800 Gross per 24 hour  Intake 100 ml  Output 200 ml  Net -100 ml   Filed Weights   07/06/22 1015 07/07/22 0500 07/08/22 0500  Weight: 40.6 kg 78.3 kg 73.5 kg     Data Reviewed:   CBC: Recent Labs  Lab 07/03/22 0931 07/16/2022 1230 07/06/22 0319 07/07/22 0232 07/08/22 0245  WBC 16.3* 14.4* 16.2* 11.3* 12.2*  NEUTROABS 13.9* 12.5*  --   --   --   HGB 9.3* 9.0* 8.5* 7.8* 8.1*  HCT 29.1* 29.6* 28.6* 26.5* 27.2*  MCV 82.9 85.3 87.5 87.5 84.5  PLT 262 248 246 232 254   Basic Metabolic Panel: Recent Labs  Lab 07/03/22 0931 07/02/2022 1230 07/06/22 0319 07/07/22 0232 07/08/22 0245  NA 136 135 131* 135 138  K 4.0 4.8 4.7 5.5* 4.4  CL 96* 99 96* 102 102  CO2 _0 GLUCOSE 189* 166* 132* 130* 157*  BUN 49* 56* 51* 62* 72*  CREATININE 1.67* 2.14* 2.35* 2.80* 2.62*  CALCIUM 10.4* 9.9 9.2 9.9 10.5*  MG  --   --  2.3 2.3 2.5*   GFR: Estimated Creatinine Clearance: 16.9 mL/min (A) (by C-G formula based on SCr of 2.62 mg/dL (H)). Liver Function Tests: Recent Labs  Lab 07/03/22 0931 07/21/2022 1811  AST 19  --   ALT 11  --   ALKPHOS 92  --   BILITOT 0.4  --   PROT 7.7 7.8  ALBUMIN 3.4*  --    No results for input(s): "LIPASE", "AMYLASE" in the last 168 hours. No results for input(s): "AMMONIA" in the last 168 hours. Coagulation Profile: No results for input(s): "INR", "PROTIME" in the last 168 hours. Cardiac Enzymes: No results for input(s): "CKTOTAL", "CKMB", "CKMBINDEX", "TROPONINI" in the last 168  hours. BNP (last 3 results) No results for input(s): "PROBNP" in the last 8760 hours. HbA1C: No results for input(s): "HGBA1C" in the last 72 hours. CBG: Recent Labs  Lab 07/06/22 1607 07/07/22 1433  GLUCAP 99 182*   Lipid Profile: No results for input(s): "CHOL", "HDL", "LDLCALC", "TRIG", "CHOLHDL", "LDLDIRECT" in the last 72 hours. Thyroid Function Tests: No results for input(s): "TSH", "T4TOTAL", "FREET4", "T3FREE", "THYROIDAB" in the last 72 hours. Anemia Panel: No results for input(s): "VITAMINB12", "FOLATE", "FERRITIN", "TIBC", "IRON", "RETICCTPCT" in the last 72 hours. Sepsis Labs: Recent Labs  Lab 07/03/22 1216 07/12/2022 1811 07/06/22 0319 07/07/22 0232  PROCALCITON  --  0.29 0.57 0.77  LATICACIDVEN 0.9  --   --   --     Recent Results (from the past 240 hour(s))  Culture, blood (routine x 2)     Status: None   Collection Time: 07/03/22 11:58 AM   Specimen: BLOOD RIGHT HAND  Result Value Ref Range Status   Specimen Description   Final    BLOOD RIGHT  HAND Performed at [facility], [place] [address], [place], [place] [address]    Special Requests   Final    BOTTLES DRAWN AEROBIC AND ANAEROBIC Blood Culture results may not be optimal due to an inadequate volume of blood received in culture bottles Performed at [place] [address], [place], [place] [address]    Culture   Final    NO GROWTH 5 DAYS Performed at [facility], [place] [address] [place], [place] [address]    Report Status 07/08/2022 FINAL  Final  Culture, blood (routine x 2)     Status: None   Collection Time: 07/03/22 12:03 PM   Specimen: BLOOD  Result Value Ref Range Status   Specimen Description   Final    BLOOD LEFT ANTECUBITAL Performed at [facility] [address] [place], [place] [address]    Special Requests   Final    BOTTLES DRAWN AEROBIC AND ANAEROBIC Blood Culture results may not be optimal due to an inadequate  volume of blood received in culture bottles Performed at [place] [address] [place], [place] [address]    Culture   Final    NO GROWTH 5 DAYS Performed at [facility], [place] [address] [place], [place] [address]    Report Status 07/08/2022 FINAL  Final  Resp Panel by RT-PCR (Flu A&B, Covid) Anterior Nasal Swab     Status: None   Collection Time: 07/03/22 12:23 PM   Specimen: Anterior Nasal Swab  Result Value Ref Range Status   SARS Coronavirus 2 by RT PCR NEGATIVE NEGATIVE Final    Comment: (NOTE) SARS-CoV-2 target nucleic acids are NOT DETECTED.  The SARS-CoV-2 RNA is generally detectable in upper respiratory specimens during the acute phase of infection. The lowest concentration of SARS-CoV-2 viral copies this assay can detect is 138 copies/mL. A negative result does not preclude SARS-Cov-2 infection and should not be used as the sole basis for treatment or other patient management decisions. A negative result may occur with  improper specimen collection/handling, submission of specimen other than nasopharyngeal swab, presence of viral mutation(s) within the areas targeted by this assay, and inadequate number of viral copies(<138 copies/mL). A negative result must be combined with clinical observations, patient history, and epidemiological information. The expected result is Negative.  Fact Sheet for Patients:  EntrepreneurPulse.com.au  Fact Sheet for Healthcare Providers:  IncredibleEmployment.be  This test is no t yet approved or cleared by the [place] FDA and  has been authorized for detection and/or diagnosis of SARS-CoV-2 by FDA under an Emergency Use Authorization (EUA). This EUA will remain  in effect (meaning this test can be used) for the duration of the COVID-19 declaration under Section 564(b)(1) of the Act, 21 U.S.C.section 360bbb-3(b)(1), unless the authorization is terminated  or  revoked sooner.       Influenza A by PCR NEGATIVE NEGATIVE Final   Influenza B by PCR NEGATIVE NEGATIVE Final    Comment: (NOTE) The Xpert Xpress SARS-CoV-2/FLU/RSV plus assay is intended as an aid in the diagnosis of influenza from Nasopharyngeal swab specimens and should not be used as a sole basis for treatment. Nasal washings and aspirates are unacceptable for Xpert Xpress SARS-CoV-2/FLU/RSV testing.  Fact Sheet for Patients: EntrepreneurPulse.com.au  Fact Sheet for Healthcare Providers: IncredibleEmployment.be  This test is not yet approved or cleared by the [place] FDA and has been authorized for detection and/or diagnosis of SARS-CoV-2 by FDA  under an Emergency Use Authorization (EUA). This EUA will remain in effect (meaning this test can be used) for the duration of the COVID-19 declaration under Section 564(b)(1) of the Act, 21 U.S.C. section 360bbb-3(b)(1), unless the authorization is terminated or revoked.  Performed at [facility], [place] [address] [place], [place] [address]   Pleural fluid culture w Gram Stain     Status: None (Preliminary result)   Collection Time: 07/28/2022  4:12 PM   Specimen: Pleural Fluid  Result Value Ref Range Status   Specimen Description   Final    PLEURAL Performed at [place] [address], [place], [place] [address]    Special Requests   Final    NONE Performed at [facility], [place] [address], [place], [place] [address]    Gram Stain   Final    FEW WBC PRESENT,BOTH PMN AND MONONUCLEAR NO ORGANISMS SEEN    Culture   Final    NO GROWTH 2 DAYS Performed at [facility], [place] [address] [place], [place] [address]    Report Status PENDING  Incomplete  Respiratory (~20 pathogens) panel by PCR     Status: None   Collection Time: 07/28/2022  4:53 PM   Specimen: Nasopharyngeal Swab; Respiratory  Result Value Ref Range Status    Adenovirus NOT DETECTED NOT DETECTED Final   Coronavirus 229E NOT DETECTED NOT DETECTED Final    Comment: (NOTE) The Coronavirus on the Respiratory Panel, DOES NOT test for the novel  Coronavirus (2019 nCoV)    Coronavirus HKU1 NOT DETECTED NOT DETECTED Final   Coronavirus NL63 NOT DETECTED NOT DETECTED Final   Coronavirus OC43 NOT DETECTED NOT DETECTED Final   Metapneumovirus NOT DETECTED NOT DETECTED Final   Rhinovirus / Enterovirus NOT DETECTED NOT DETECTED Final   Influenza A NOT DETECTED NOT DETECTED Final   Influenza B NOT DETECTED NOT DETECTED Final   Parainfluenza Virus 1 NOT DETECTED NOT DETECTED Final   Parainfluenza Virus 2 NOT DETECTED NOT DETECTED Final   Parainfluenza Virus 3 NOT DETECTED NOT DETECTED Final   Parainfluenza Virus 4 NOT DETECTED NOT DETECTED Final   Respiratory Syncytial Virus NOT DETECTED NOT DETECTED Final   Bordetella pertussis NOT DETECTED NOT DETECTED Final   Bordetella Parapertussis NOT DETECTED NOT DETECTED Final   Chlamydophila pneumoniae NOT DETECTED NOT DETECTED Final   Mycoplasma pneumoniae NOT DETECTED NOT DETECTED Final    Comment: Performed at [facility], [place]. [address] [place], [place] [address]  MRSA Next Gen by PCR, Nasal     Status: None   Collection Time: 07/07/22 12:44 PM   Specimen: Nasal Mucosa; Nasal Swab  Result Value Ref Range Status   MRSA by PCR Next Gen NOT DETECTED NOT DETECTED Final    Comment: (NOTE) The GeneXpert MRSA Assay (FDA approved for NASAL specimens only), is one component of a comprehensive MRSA colonization surveillance program. It is not intended to diagnose MRSA infection nor to guide or monitor treatment for MRSA infections. Test performance is not FDA approved in patients less than 8 years old. Performed at [facility], [place] [address], [place], [place] [address]          Radiology Studies: US RENAL  Result Date: 07/07/2022 CLINICAL DATA:  Acute kidney injury EXAM:  RENAL / URINARY TRACT ULTRASOUND COMPLETE COMPARISON:  CT abdomen and pelvis dated 04/24/2022 FINDINGS: Right Kidney: Length = 12.5 cm Normal parenchymal echogenicity with preserved corticomedullary differentiation.  Thick-walled cystic lesion along the posterior right kidney measures 4.1 x 4.0 cm, similar to slightly increased in size from prior CT when it measured 4.0 x 3.6 cm. Multiple additional simple-appearing renal cysts measuring up to 3.3 cm. No urinary tract dilation or shadowing calculi. The ureter is not seen. Left Kidney: Length = 12.5 cm Normal parenchymal echogenicity with preserved corticomedullary differentiation. Multiple simple-appearing cysts measuring up to 0.9 cm. No urinary tract dilation or shadowing calculi. The ureter is not seen. Bladder: Appears normal for degree of bladder distention. Other: Bilateral pleural effusions. IMPRESSION: 1. No evidence of obstructive uropathy. 2. Thick-walled cystic lesion along the posterior right kidney measures up to 4.1 cm, similar to slightly increased in size from prior CT when it measured up to 4.0 cm, remains suspicious for primary renal neoplasm. 3. Bilateral pleural effusions. Electronically Signed   By: Darrin Nipper M.D.   On: 07/07/2022 17:18   CT HEAD CODE STROKE WO CONTRAST  Addendum Date: 07/07/2022   ADDENDUM REPORT: 07/07/2022 15:22 ADDENDUM: Discussed with Dr. Reesa Chew at 1522 hours. Electronically Signed   By: Nelson Chimes M.D.   On: 07/07/2022 15:22   Result Date: 07/07/2022 CLINICAL DATA:  Code stroke. Neuro deficit, acute, stroke suspected. Left arm weakness. EXAM: CT HEAD WITHOUT CONTRAST TECHNIQUE: Contiguous axial images were obtained from the base of the skull through the vertex without intravenous contrast. RADIATION DOSE REDUCTION: This exam was performed according to the departmental dose-optimization program which includes automated exposure control, adjustment of the mA and/or kV according to patient size and/or use of iterative  reconstruction technique. COMPARISON:  MRI 01/17/2022 FINDINGS: Brain: No focal abnormality seen affecting the brainstem or cerebellum. Cerebral hemispheres show a chronic dilated perivascular space at the base of the brain on the right and mild chronic small-vessel ischemic change of the cerebral hemispheric white matter. No sign of acute infarction, mass lesion, hemorrhage, hydrocephalus or extra-axial collection. Vascular: There is atherosclerotic calcification of the major vessels at the base of the brain. Skull: Negative Sinuses/Orbits: Clear/normal Other: None ASPECTS (Stickney Stroke Program Early CT Score) - Ganglionic level infarction (caudate, lentiform nuclei, internal capsule, insula, M1-M3 cortex): 7 - Supraganglionic infarction (M4-M6 cortex): 3 Total score (0-10 with 10 being normal): 10 IMPRESSION: 1. No acute CT finding. Mild chronic small-vessel ischemic change of the cerebral hemispheric white matter. 2. Aspects is 10. Call report in progress Electronically Signed: By: Nelson Chimes M.D. On: 07/07/2022 15:11   DG Chest 1 View  Result Date: 07/07/2022 CLINICAL DATA:  CHF. EXAM: CHEST  1 VIEW COMPARISON:  CXR 07/28/2022 FINDINGS: Increased left and new right-sided pleural effusion. No pneumothorax. Cardiac contours are poorly visualized. Mediastinal contours are unchanged compared to prior exam with persistent dense atherosclerotic calcification of the transverse aorta. There is interval increase in prominence of bilateral interstitial opacities, particularly at the bilateral lung bases, which most likely represents worsening pulmonary edema, but superimposed infection is difficult to exclude no displaced rib fractures. No acute osseous abnormality. Visualized upper abdomen is unremarkable. IMPRESSION: Increased left and new right-sided pleural effusion. There is interval increase in prominence of bilateral interstitial opacities, which most likely represents worsening pulmonary edema, but  superimposed infection is difficult to exclude. Electronically Signed   By: Marin Roberts M.D.   On: 07/07/2022 12:28   ECHOCARDIOGRAM COMPLETE  Result Date: 07/06/2022    ECHOCARDIOGRAM REPORT   Patient Name:   [name] Date of Exam: 07/06/2022 Medical Rec #:  [id number]  Height:       65.0 in Accession #:    [id number]      Weight:       89.5 lb Date of Birth:  10-12-1940      BSA:          1.406 m Patient Age:    35 years        BP:           138/66 mmHg Patient Gender: F               HR:           106 bpm. Exam Location:  Inpatient Procedure: 2D Echo, Color Doppler and Cardiac Doppler Indications:    I42.9 Cardiomyopathy (unspecified)  History:        Patient has prior history of Echocardiogram examinations, most                 recent 11/04/2021. CHF, COPD; Risk Factors:Hypertension,                 Dyslipidemia, Sleep Apnea and Stage IV Lung Cancer.  Sonographer:    Raquel Sarna Senior RDCS Referring Phys: [phone] [facility] Sirenia Whitis  Sonographer Comments: Technically difficult study, unable to get apical windows due to patient discomfort and reduced skin integrity. IMPRESSIONS  1. Left ventricular ejection fraction, by estimation, is 65 to 70%. The left ventricle has normal function. The left ventricle has no regional wall motion abnormalities. There is moderate concentric left ventricular hypertrophy. Left ventricular diastolic function could not be evaluated.  2. Right ventricular systolic function was not well visualized. The right ventricular size is not well visualized. There is moderately elevated pulmonary artery systolic pressure.  3. The mitral valve is grossly normal. Mild to moderate mitral valve regurgitation. No evidence of mitral stenosis.  4. The aortic valve is tricuspid. There is mild calcification of the aortic valve. There is mild thickening of the aortic valve. Aortic valve regurgitation is trivial. Aortic valve sclerosis/calcification is present, without any evidence of aortic  stenosis.  5. The inferior vena cava is dilated in size with <50% respiratory variability, suggesting right atrial pressure of 15 mmHg. Comparison(s): No significant change from prior study. Conclusion(s)/Recommendation(s): Normal LVEF. Unable to assess RV well as patient could not tolerate apical images. FINDINGS  Left Ventricle: Left ventricular ejection fraction, by estimation, is 65 to 70%. The left ventricle has normal function. The left ventricle has no regional wall motion abnormalities. The left ventricular internal cavity size was normal in size. There is  moderate concentric left ventricular hypertrophy. Left ventricular diastolic function could not be evaluated due to atrial fibrillation. Left ventricular diastolic function could not be evaluated. Right Ventricle: The right ventricular size is not well visualized. Right vetricular wall thickness was not well visualized. Right ventricular systolic function was not well visualized. There is moderately elevated pulmonary artery systolic pressure. The  tricuspid regurgitant velocity is 3.22 m/s, and with an assumed right atrial pressure of 15 mmHg, the estimated right ventricular systolic pressure is 93.8 mmHg. Left Atrium: Left atrial size was not well visualized. Right Atrium: Right atrial size was not well visualized. Pericardium: There is no evidence of pericardial effusion. Mitral Valve: The mitral valve is grossly normal. Mild mitral annular calcification. Mild to moderate mitral valve regurgitation. No evidence of mitral valve stenosis. Tricuspid Valve: The tricuspid valve is normal in structure. Tricuspid valve regurgitation is mild . No evidence of tricuspid stenosis. Aortic Valve: The aortic valve is tricuspid. There  is mild calcification of the aortic valve. There is mild thickening of the aortic valve. Aortic valve regurgitation is trivial. Aortic valve sclerosis/calcification is present, without any evidence of aortic stenosis. Pulmonic Valve:  The pulmonic valve was not well visualized. Pulmonic valve regurgitation is not visualized. Aorta: The aortic root and ascending aorta are structurally normal, with no evidence of dilitation. Venous: The inferior vena cava is dilated in size with less than 50% respiratory variability, suggesting right atrial pressure of 15 mmHg. IAS/Shunts: The interatrial septum was not well visualized.  LEFT VENTRICLE PLAX 2D LVIDd:         4.30 cm LVIDs:         3.00 cm LV PW:         1.68 cm LV IVS:        1.63 cm LVOT diam:     2.00 cm LVOT Area:     3.14 cm  LEFT ATRIUM         Index LA diam:    4.30 cm 3.06 cm/m   AORTA Ao Root diam: 2.70 cm Ao Asc diam:  2.90 cm TRICUSPID VALVE TR Peak grad:   41.5 mmHg TR Vmax:        322.00 cm/s  SHUNTS Systemic Diam: 2.00 cm Buford Dresser MD Electronically signed by Buford Dresser MD Signature Date/Time: 07/06/2022/4:46:47 PM    Final         Scheduled Meds:  Chlorhexidine Gluconate Cloth  6 each Topical Daily   ferrous sulfate  325 mg Oral BID WC   nystatin   Topical BID   Continuous Infusions:     LOS: 3 days   Time spent= 35 mins    Caillou Minus Arsenio Loader, MD [facility]  If 7PM-7AM, please contact night-coverage  07/08/2022, 11:55 AM

## 2022-07-09 ENCOUNTER — Other Ambulatory Visit: Payer: Self-pay

## 2022-07-09 DIAGNOSIS — C3492 Malignant neoplasm of unspecified part of left bronchus or lung: Secondary | ICD-10-CM | POA: Diagnosis not present

## 2022-07-09 DIAGNOSIS — R0603 Acute respiratory distress: Secondary | ICD-10-CM

## 2022-07-09 LAB — BODY FLUID CULTURE W GRAM STAIN: Culture: NO GROWTH

## 2022-07-09 LAB — CYTOLOGY - NON PAP

## 2022-07-30 NOTE — Progress Notes (Signed)
PROGRESS NOTE    [name]  NGE:[id number] DOB: 18-Apr-1941 DOA: 07/23/2022 PCP: [name], MD   Brief Narrative:   82 y.o. female with medical history significant of stage IV adenocarcinoma of lung followed by Dr. Earlie Server on [place], HTN, GERD, COPD, diastolic CHF, MGUS, osteopenia, chronic hypoxia on 2 L nasal cannula comes to the hospital for shortness of breath.  Patient admitted to the hospital for atrial fibrillation with RVR, acute kidney injury, CHF exacerbation, pleural effusion/malignant recurrent effusion.  Pulmonary and cardiology teams were consulted.  Initially patient was started on Cardizem drip which was slowly transitioned to p.o. hospital course has been complicated by acute kidney injury. During the hospitalization patient was also seen by nephrology team.  Hospital course was complicated by acute CVA/left-sided weakness requiring CT head which was negative.  Due to overall worsening in her condition, comorbidities, patient was transitioned to comfort care.   Assessment & Plan:  Active Problems:   Adenocarcinoma of left lung, stage 4 (HCC)   HTN (hypertension)   Acute on chronic diastolic heart failure (HCC)   Acute on chronic respiratory failure with hypoxemia (HCC)   COPD (chronic obstructive pulmonary disease) (HCC)   GERD   Acute respiratory failure with hypoxia (HCC)   MGUS (monoclonal gammopathy of unknown significance)   Acute congestive heart failure (HCC)   Chronic kidney disease, stage 3b (HCC)   Atrial fibrillation with RVR ([place])   Hospice care patient   Acute on chronic hypoxic respiratory distress - Unfortunately her symptoms continue to worsen and appears to be aspirating.  She has not been transitioned to comfort care   Moderate left-sided pleural effusion - Likely malignant effusion.  Will discontinue antibiotics, steroids.  She has been transitioned to comfort care   Atrial fibrillation with RVR Acute congestive heart failure with  preserved ejection fraction, 70%.  Class IV Acute kidney injury on CKD stage IIIb Hyperkalemia, potassium 5.5 Anemia of chronic disease Stage IV nonsmall cell lung cancer History of COPD Anxiety Acute left-sided weakness, improved.  Transient ischemic attack   -Due to continued and rapid decline in patient's condition.  I met with family members at bedside including the daughters, granddaughters, sister.  ON COMFORT CARE   Patient has now been transitioned to comfort care.  Anticipate in-hospital death over next 24-48 hours. Body mass index is 26.96 kg/m.  Pressure Injury 07/25/2022 Buttocks Bilateral Deep Tissue Pressure Injury - Purple or maroon localized area of discolored intact skin or blood-filled blister due to damage of underlying soft tissue from pressure and/or shear. Deep tissue injury with stage  (Active)  07/19/2022 1730  Location: Buttocks  Location Orientation: Bilateral  Staging: Deep Tissue Pressure Injury - Purple or maroon localized area of discolored intact skin or blood-filled blister due to damage of underlying soft tissue from pressure and/or shear.  Wound Description (Comments): Deep tissue injury with stage 2  Present on Admission: Yes        Subjective: Started on morphine drip due to SOB and moaning, now comfortably.   Examination:  Constitutional: on morphine drip, drowsy/sedated.  Respiratory: Diffuse bilateral rhonchi Cardiovascular: Irregularly irregular Abdomen: Nontender nondistended good bowel sounds Musculoskeletal: Trace bilateral lower extremity edema Skin: No rashes seen Neurologic: Unable to fully assess but clear left upper extremity weakness. Psychiatric: Unable to fully assess  Objective: Vitals:   07/08/22 1600 07-25-22 0500 2022-07-25 0700 2022/07/25 0800  BP:    (!) 107/48  Pulse: (!) 117  (!) 118 (!) 125  Resp:  _0 Temp:      TempSrc:      SpO2: (!) 74%  (!) 62% (!) 55%  Weight:  73.5 kg    Height:         Intake/Output Summary (Last 24 hours) at Jul 28, 2022 1311 Last data filed at 2022-07-28 0800 Gross per 24 hour  Intake 38.13 ml  Output --  Net 38.13 ml   Filed Weights   07/07/22 0500 07/08/22 0500 2022/07/28 0500  Weight: 78.3 kg 73.5 kg 73.5 kg     Data Reviewed:   CBC: Recent Labs  Lab 07/03/22 0931 07/13/2022 1230 07/06/22 0319 07/07/22 0232 07/08/22 0245  WBC 16.3* 14.4* 16.2* 11.3* 12.2*  NEUTROABS 13.9* 12.5*  --   --   --   HGB 9.3* 9.0* 8.5* 7.8* 8.1*  HCT 29.1* 29.6* 28.6* 26.5* 27.2*  MCV 82.9 85.3 87.5 87.5 84.5  PLT 262 248 246 232 250   Basic Metabolic Panel: Recent Labs  Lab 07/03/22 0931 07/10/2022 1230 07/06/22 0319 07/07/22 0232 07/08/22 0245  NA 136 135 131* 135 138  K 4.0 4.8 4.7 5.5* 4.4  CL 96* 99 96* 102 102  CO2 _1 GLUCOSE 189* 166* 132* 130* 157*  BUN 49* 56* 51* 62* 72*  CREATININE 1.67* 2.14* 2.35* 2.80* 2.62*  CALCIUM 10.4* 9.9 9.2 9.9 10.5*  MG  --   --  2.3 2.3 2.5*   GFR: Estimated Creatinine Clearance: 16.9 mL/min (A) (by C-G formula based on SCr of 2.62 mg/dL (H)). Liver Function Tests: Recent Labs  Lab 07/03/22 0931 07/04/2022 1811  AST 19  --   ALT 11  --   ALKPHOS 92  --   BILITOT 0.4  --   PROT 7.7 7.8  ALBUMIN 3.4*  --    No results for input(s): "LIPASE", "AMYLASE" in the last 168 hours. No results for input(s): "AMMONIA" in the last 168 hours. Coagulation Profile: No results for input(s): "INR", "PROTIME" in the last 168 hours. Cardiac Enzymes: No results for input(s): "CKTOTAL", "CKMB", "CKMBINDEX", "TROPONINI" in the last 168 hours. BNP (last 3 results) No results for input(s): "PROBNP" in the last 8760 hours. HbA1C: No results for input(s): "HGBA1C" in the last 72 hours. CBG: Recent Labs  Lab 07/06/22 1607 07/07/22 1433  GLUCAP 99 182*   Lipid Profile: No results for input(s): "CHOL", "HDL", "LDLCALC", "TRIG", "CHOLHDL", "LDLDIRECT" in the last 72 hours. Thyroid Function Tests: No  results for input(s): "TSH", "T4TOTAL", "FREET4", "T3FREE", "THYROIDAB" in the last 72 hours. Anemia Panel: No results for input(s): "VITAMINB12", "FOLATE", "FERRITIN", "TIBC", "IRON", "RETICCTPCT" in the last 72 hours. Sepsis Labs: Recent Labs  Lab 07/03/22 1216 07/03/2022 1811 07/06/22 0319 07/07/22 0232  PROCALCITON  --  0.29 0.57 0.77  LATICACIDVEN 0.9  --   --   --     Recent Results (from the past 240 hour(s))  Culture, blood (routine x 2)     Status: None   Collection Time: 07/03/22 11:58 AM   Specimen: BLOOD RIGHT HAND  Result Value Ref Range Status   Specimen Description   Final    BLOOD RIGHT HAND Performed at [facility] [address], [place], [place] [address]    Special Requests   Final    BOTTLES DRAWN AEROBIC AND ANAEROBIC Blood Culture results may not be optimal due to an inadequate volume of blood received in culture bottles Performed at [facility] [address]  [place], [place] [address]    Culture   Final    NO GROWTH 5 DAYS Performed at [facility], [place] [address], [place], [place] [address]    Report Status 07/08/2022 FINAL  Final  Culture, blood (routine x 2)     Status: None   Collection Time: 07/03/22 12:03 PM   Specimen: BLOOD  Result Value Ref Range Status   Specimen Description   Final    BLOOD LEFT ANTECUBITAL Performed at [facility] [address] [place], [place] [address]    Special Requests   Final    BOTTLES DRAWN AEROBIC AND ANAEROBIC Blood Culture results may not be optimal due to an inadequate volume of blood received in culture bottles Performed at [facility] [address], [place], [place] [address]    Culture   Final    NO GROWTH 5 DAYS Performed at [facility], [place] [address] [place], [place] [address]    Report Status 07/08/2022 FINAL  Final  Resp Panel by RT-PCR (Flu A&B, Covid) Anterior Nasal Swab     Status: None    Collection Time: 07/03/22 12:23 PM   Specimen: Anterior Nasal Swab  Result Value Ref Range Status   SARS Coronavirus 2 by RT PCR NEGATIVE NEGATIVE Final    Comment: (NOTE) SARS-CoV-2 target nucleic acids are NOT DETECTED.  The SARS-CoV-2 RNA is generally detectable in upper respiratory specimens during the acute phase of infection. The lowest concentration of SARS-CoV-2 viral copies this assay can detect is 138 copies/mL. A negative result does not preclude SARS-Cov-2 infection and should not be used as the sole basis for treatment or other patient management decisions. A negative result may occur with  improper specimen collection/handling, submission of specimen other than nasopharyngeal swab, presence of viral mutation(s) within the areas targeted by this assay, and inadequate number of viral copies(<138 copies/mL). A negative result must be combined with clinical observations, patient history, and epidemiological information. The expected result is Negative.  Fact Sheet for Patients:  EntrepreneurPulse.com.au  Fact Sheet for Healthcare Providers:  IncredibleEmployment.be  This test is no t yet approved or cleared by the [place] FDA and  has been authorized for detection and/or diagnosis of SARS-CoV-2 by FDA under an Emergency Use Authorization (EUA). This EUA will remain  in effect (meaning this test can be used) for the duration of the COVID-19 declaration under Section 564(b)(1) of the Act, 21 U.S.C.section 360bbb-3(b)(1), unless the authorization is terminated  or revoked sooner.       Influenza A by PCR NEGATIVE NEGATIVE Final   Influenza B by PCR NEGATIVE NEGATIVE Final    Comment: (NOTE) The Xpert Xpress SARS-CoV-2/FLU/RSV plus assay is intended as an aid in the diagnosis of influenza from Nasopharyngeal swab specimens and should not be used as a sole basis for treatment. Nasal washings and aspirates are unacceptable for  Xpert Xpress SARS-CoV-2/FLU/RSV testing.  Fact Sheet for Patients: EntrepreneurPulse.com.au  Fact Sheet for Healthcare Providers: IncredibleEmployment.be  This test is not yet approved or cleared by the [place] FDA and has been authorized for detection and/or diagnosis of SARS-CoV-2 by FDA under an Emergency Use Authorization (EUA). This EUA will remain in effect (meaning this test can be used) for the duration of the COVID-19 declaration under Section 564(b)(1) of the Act, 21 U.S.C. section 360bbb-3(b)(1), unless the authorization is terminated or revoked.  Performed at [facility], [place] [address] [place], [place] [address]  Pleural fluid culture w Gram Stain     Status: None   Collection Time: 07/24/2022  4:12 PM   Specimen: Pleural Fluid  Result Value Ref Range Status   Specimen Description   Final    PLEURAL Performed at [facility] [address] [place], [place] [address]    Special Requests   Final    NONE Performed at [facility], [place] [address], [place], [place] [address]    Gram Stain   Final    FEW WBC PRESENT,BOTH PMN AND MONONUCLEAR NO ORGANISMS SEEN    Culture   Final    NO GROWTH Performed at [facility], [place] [address] [place], [place] [address]    Report Status July 11, 2022 FINAL  Final  Respiratory (~20 pathogens) panel by PCR     Status: None   Collection Time: 07/03/2022  4:53 PM   Specimen: Nasopharyngeal Swab; Respiratory  Result Value Ref Range Status   Adenovirus NOT DETECTED NOT DETECTED Final   Coronavirus 229E NOT DETECTED NOT DETECTED Final    Comment: (NOTE) The Coronavirus on the Respiratory Panel, DOES NOT test for the novel  Coronavirus (2019 nCoV)    Coronavirus HKU1 NOT DETECTED NOT DETECTED Final   Coronavirus NL63 NOT DETECTED NOT DETECTED Final   Coronavirus OC43 NOT DETECTED NOT DETECTED Final   Metapneumovirus NOT  DETECTED NOT DETECTED Final   Rhinovirus / Enterovirus NOT DETECTED NOT DETECTED Final   Influenza A NOT DETECTED NOT DETECTED Final   Influenza B NOT DETECTED NOT DETECTED Final   Parainfluenza Virus 1 NOT DETECTED NOT DETECTED Final   Parainfluenza Virus 2 NOT DETECTED NOT DETECTED Final   Parainfluenza Virus 3 NOT DETECTED NOT DETECTED Final   Parainfluenza Virus 4 NOT DETECTED NOT DETECTED Final   Respiratory Syncytial Virus NOT DETECTED NOT DETECTED Final   Bordetella pertussis NOT DETECTED NOT DETECTED Final   Bordetella Parapertussis NOT DETECTED NOT DETECTED Final   Chlamydophila pneumoniae NOT DETECTED NOT DETECTED Final   Mycoplasma pneumoniae NOT DETECTED NOT DETECTED Final    Comment: Performed at [facility], [place] [address], [place], [place] [address]  MRSA Next Gen by PCR, Nasal     Status: None   Collection Time: 07/07/22 12:44 PM   Specimen: Nasal Mucosa; Nasal Swab  Result Value Ref Range Status   MRSA by PCR Next Gen NOT DETECTED NOT DETECTED Final    Comment: (NOTE) The GeneXpert MRSA Assay (FDA approved for NASAL specimens only), is one component of a comprehensive MRSA colonization surveillance program. It is not intended to diagnose MRSA infection nor to guide or monitor treatment for MRSA infections. Test performance is not FDA approved in patients less than 26 years old. Performed at [facility], [place] [address] [place], [place] [address]          Radiology Studies: US RENAL  Result Date: 07/07/2022 CLINICAL DATA:  Acute kidney injury EXAM: RENAL / URINARY TRACT ULTRASOUND COMPLETE COMPARISON:  CT abdomen and pelvis dated 04/24/2022 FINDINGS: Right Kidney: Length = 12.5 cm Normal parenchymal echogenicity with preserved corticomedullary differentiation. Thick-walled cystic lesion along the posterior right kidney measures 4.1 x 4.0 cm, similar to slightly increased in size from prior CT when it measured 4.0 x 3.6 cm. Multiple  additional simple-appearing renal cysts measuring up to 3.3 cm. No urinary tract dilation or shadowing calculi. The ureter is not seen. Left Kidney: Length = 12.5 cm Normal parenchymal echogenicity with  preserved corticomedullary differentiation. Multiple simple-appearing cysts measuring up to 0.9 cm. No urinary tract dilation or shadowing calculi. The ureter is not seen. Bladder: Appears normal for degree of bladder distention. Other: Bilateral pleural effusions. IMPRESSION: 1. No evidence of obstructive uropathy. 2. Thick-walled cystic lesion along the posterior right kidney measures up to 4.1 cm, similar to slightly increased in size from prior CT when it measured up to 4.0 cm, remains suspicious for primary renal neoplasm. 3. Bilateral pleural effusions. Electronically Signed   By: Darrin Nipper M.D.   On: 07/07/2022 17:18   CT HEAD CODE STROKE WO CONTRAST  Addendum Date: 07/07/2022   ADDENDUM REPORT: 07/07/2022 15:22 ADDENDUM: Discussed with Dr. Reesa Chew at 1522 hours. Electronically Signed   By: Nelson Chimes M.D.   On: 07/07/2022 15:22   Result Date: 07/07/2022 CLINICAL DATA:  Code stroke. Neuro deficit, acute, stroke suspected. Left arm weakness. EXAM: CT HEAD WITHOUT CONTRAST TECHNIQUE: Contiguous axial images were obtained from the base of the skull through the vertex without intravenous contrast. RADIATION DOSE REDUCTION: This exam was performed according to the departmental dose-optimization program which includes automated exposure control, adjustment of the mA and/or kV according to patient size and/or use of iterative reconstruction technique. COMPARISON:  MRI 01/17/2022 FINDINGS: Brain: No focal abnormality seen affecting the brainstem or cerebellum. Cerebral hemispheres show a chronic dilated perivascular space at the base of the brain on the right and mild chronic small-vessel ischemic change of the cerebral hemispheric white matter. No sign of acute infarction, mass lesion, hemorrhage, hydrocephalus  or extra-axial collection. Vascular: There is atherosclerotic calcification of the major vessels at the base of the brain. Skull: Negative Sinuses/Orbits: Clear/normal Other: None ASPECTS ([place] Stroke Program Early CT Score) - Ganglionic level infarction (caudate, lentiform nuclei, internal capsule, insula, M1-M3 cortex): 7 - Supraganglionic infarction (M4-M6 cortex): 3 Total score (0-10 with 10 being normal): 10 IMPRESSION: 1. No acute CT finding. Mild chronic small-vessel ischemic change of the cerebral hemispheric white matter. 2. Aspects is 10. Call report in progress Electronically Signed: By: Nelson Chimes M.D. On: 07/07/2022 15:11        Scheduled Meds:  Chlorhexidine Gluconate Cloth  6 each Topical Daily   ferrous sulfate  325 mg Oral BID WC   nystatin   Topical BID   Continuous Infusions:  morphine 5 mg/hr (July 23, 2022 0904)      LOS: 4 days   Time spent= 35 mins    Kimorah Ridolfi Arsenio Loader, MD [facility]  If 7PM-7AM, please contact night-coverage  Jul 23, 2022, 1:11 PM

## 2022-07-30 NOTE — Death Summary Note (Signed)
DEATH SUMMARY   Patient Details  Name: [name] MRN: [id number] DOB: Aug 28, 1940 UYQ:[id number], Arvid Right, MD Admission/Discharge Information   Admit Date:  2022-07-19  Date of Death: Date of Death: July 23, 2022  Time of Death: Time of Death: 60  Length of Stay: 4   Principle Cause of death:  Acute on chronic hypoxic respiratory distress   Hospital Diagnoses: Active Problems:   Adenocarcinoma of left lung, stage 4 (HCC)   HTN (hypertension)   Acute on chronic diastolic heart failure (HCC)   Acute on chronic respiratory failure with hypoxemia (HCC)   COPD (chronic obstructive pulmonary disease) (HCC)   GERD   Acute respiratory failure with hypoxia (HCC)   MGUS (monoclonal gammopathy of unknown significance)   Acute congestive heart failure (HCC)   Chronic kidney disease, stage 3b (HCC)   Atrial fibrillation with RVR ([place])   Hospice care patient   Brief Narrative:   82 y.o. female with medical history significant of stage IV adenocarcinoma of lung followed by Dr. Earlie Server on [place], HTN, GERD, COPD, diastolic CHF, MGUS, osteopenia, chronic hypoxia on 2 L nasal cannula comes to the hospital for shortness of breath.  Patient admitted to the hospital for atrial fibrillation with RVR, acute kidney injury, CHF exacerbation, pleural effusion/malignant recurrent effusion.  Pulmonary and cardiology teams were consulted.  Initially patient was started on Cardizem drip which was slowly transitioned to p.o. hospital course has been complicated by acute kidney injury. During the hospitalization patient was also seen by nephrology team.  Hospital course was complicated by acute CVA/left-sided weakness requiring CT head which was negative.  Due to overall worsening in her condition, comorbidities, patient was transitioned to comfort care.  Eventually patient passed away at 225pm today.   The results of significant diagnostics from this hospitalization (including imaging, microbiology,  ancillary and laboratory) are listed below for reference.   Significant Diagnostic Studies: US RENAL  Result Date: 07/07/2022 CLINICAL DATA:  Acute kidney injury EXAM: RENAL / URINARY TRACT ULTRASOUND COMPLETE COMPARISON:  CT abdomen and pelvis dated 04/24/2022 FINDINGS: Right Kidney: Length = 12.5 cm Normal parenchymal echogenicity with preserved corticomedullary differentiation. Thick-walled cystic lesion along the posterior right kidney measures 4.1 x 4.0 cm, similar to slightly increased in size from prior CT when it measured 4.0 x 3.6 cm. Multiple additional simple-appearing renal cysts measuring up to 3.3 cm. No urinary tract dilation or shadowing calculi. The ureter is not seen. Left Kidney: Length = 12.5 cm Normal parenchymal echogenicity with preserved corticomedullary differentiation. Multiple simple-appearing cysts measuring up to 0.9 cm. No urinary tract dilation or shadowing calculi. The ureter is not seen. Bladder: Appears normal for degree of bladder distention. Other: Bilateral pleural effusions. IMPRESSION: 1. No evidence of obstructive uropathy. 2. Thick-walled cystic lesion along the posterior right kidney measures up to 4.1 cm, similar to slightly increased in size from prior CT when it measured up to 4.0 cm, remains suspicious for primary renal neoplasm. 3. Bilateral pleural effusions. Electronically Signed   By: Darrin Nipper M.D.   On: 07/07/2022 17:18   CT HEAD CODE STROKE WO CONTRAST  Addendum Date: 07/07/2022   ADDENDUM REPORT: 07/07/2022 15:22 ADDENDUM: Discussed with Dr. Reesa Chew at 1522 hours. Electronically Signed   By: Nelson Chimes M.D.   On: 07/07/2022 15:22   Result Date: 07/07/2022 CLINICAL DATA:  Code stroke. Neuro deficit, acute, stroke suspected. Left arm weakness. EXAM: CT HEAD WITHOUT CONTRAST TECHNIQUE: Contiguous axial images were obtained from the base of the skull through  the vertex without intravenous contrast. RADIATION DOSE REDUCTION: This exam was performed according  to the departmental dose-optimization program which includes automated exposure control, adjustment of the mA and/or kV according to patient size and/or use of iterative reconstruction technique. COMPARISON:  MRI 01/17/2022 FINDINGS: Brain: No focal abnormality seen affecting the brainstem or cerebellum. Cerebral hemispheres show a chronic dilated perivascular space at the base of the brain on the right and mild chronic small-vessel ischemic change of the cerebral hemispheric white matter. No sign of acute infarction, mass lesion, hemorrhage, hydrocephalus or extra-axial collection. Vascular: There is atherosclerotic calcification of the major vessels at the base of the brain. Skull: Negative Sinuses/Orbits: Clear/normal Other: None ASPECTS ([place] Stroke Program Early CT Score) - Ganglionic level infarction (caudate, lentiform nuclei, internal capsule, insula, M1-M3 cortex): 7 - Supraganglionic infarction (M4-M6 cortex): 3 Total score (0-10 with 10 being normal): 10 IMPRESSION: 1. No acute CT finding. Mild chronic small-vessel ischemic change of the cerebral hemispheric white matter. 2. Aspects is 10. Call report in progress Electronically Signed: By: Nelson Chimes M.D. On: 07/07/2022 15:11   DG Chest 1 View  Result Date: 07/07/2022 CLINICAL DATA:  CHF. EXAM: CHEST  1 VIEW COMPARISON:  CXR 07/24/2022 FINDINGS: Increased left and new right-sided pleural effusion. No pneumothorax. Cardiac contours are poorly visualized. Mediastinal contours are unchanged compared to prior exam with persistent dense atherosclerotic calcification of the transverse aorta. There is interval increase in prominence of bilateral interstitial opacities, particularly at the bilateral lung bases, which most likely represents worsening pulmonary edema, but superimposed infection is difficult to exclude no displaced rib fractures. No acute osseous abnormality. Visualized upper abdomen is unremarkable. IMPRESSION: Increased left and new  right-sided pleural effusion. There is interval increase in prominence of bilateral interstitial opacities, which most likely represents worsening pulmonary edema, but superimposed infection is difficult to exclude. Electronically Signed   By: Marin Roberts M.D.   On: 07/07/2022 12:28   ECHOCARDIOGRAM COMPLETE  Result Date: 07/06/2022    ECHOCARDIOGRAM REPORT   Patient Name:   [name] Mendez Date of Exam: 07/06/2022 Medical Rec #:  [id number]       Height:       65.0 in Accession #:    [id number]      Weight:       89.5 lb Date of Birth:  June 05, 1941      BSA:          1.406 m Patient Age:    26 years        BP:           138/66 mmHg Patient Gender: F               HR:           106 bpm. Exam Location:  Inpatient Procedure: 2D Echo, Color Doppler and Cardiac Doppler Indications:    I42.9 Cardiomyopathy (unspecified)  History:        Patient has prior history of Echocardiogram examinations, most                 recent 11/04/2021. CHF, COPD; Risk Factors:Hypertension,                 Dyslipidemia, Sleep Apnea and Stage IV Lung Cancer.  Sonographer:    Raquel Sarna Senior RDCS Referring Phys: [phone] [facility] Jairen Goldfarb  Sonographer Comments: Technically difficult study, unable to get apical windows due to patient discomfort and reduced skin integrity. IMPRESSIONS  1. Left ventricular ejection fraction, by estimation,  is 65 to 70%. The left ventricle has normal function. The left ventricle has no regional wall motion abnormalities. There is moderate concentric left ventricular hypertrophy. Left ventricular diastolic function could not be evaluated.  2. Right ventricular systolic function was not well visualized. The right ventricular size is not well visualized. There is moderately elevated pulmonary artery systolic pressure.  3. The mitral valve is grossly normal. Mild to moderate mitral valve regurgitation. No evidence of mitral stenosis.  4. The aortic valve is tricuspid. There is mild calcification of the aortic valve.  There is mild thickening of the aortic valve. Aortic valve regurgitation is trivial. Aortic valve sclerosis/calcification is present, without any evidence of aortic stenosis.  5. The inferior vena cava is dilated in size with <50% respiratory variability, suggesting right atrial pressure of 15 mmHg. Comparison(s): No significant change from prior study. Conclusion(s)/Recommendation(s): Normal LVEF. Unable to assess RV well as patient could not tolerate apical images. FINDINGS  Left Ventricle: Left ventricular ejection fraction, by estimation, is 65 to 70%. The left ventricle has normal function. The left ventricle has no regional wall motion abnormalities. The left ventricular internal cavity size was normal in size. There is  moderate concentric left ventricular hypertrophy. Left ventricular diastolic function could not be evaluated due to atrial fibrillation. Left ventricular diastolic function could not be evaluated. Right Ventricle: The right ventricular size is not well visualized. Right vetricular wall thickness was not well visualized. Right ventricular systolic function was not well visualized. There is moderately elevated pulmonary artery systolic pressure. The  tricuspid regurgitant velocity is 3.22 m/s, and with an assumed right atrial pressure of 15 mmHg, the estimated right ventricular systolic pressure is 83.4 mmHg. Left Atrium: Left atrial size was not well visualized. Right Atrium: Right atrial size was not well visualized. Pericardium: There is no evidence of pericardial effusion. Mitral Valve: The mitral valve is grossly normal. Mild mitral annular calcification. Mild to moderate mitral valve regurgitation. No evidence of mitral valve stenosis. Tricuspid Valve: The tricuspid valve is normal in structure. Tricuspid valve regurgitation is mild . No evidence of tricuspid stenosis. Aortic Valve: The aortic valve is tricuspid. There is mild calcification of the aortic valve. There is mild thickening of  the aortic valve. Aortic valve regurgitation is trivial. Aortic valve sclerosis/calcification is present, without any evidence of aortic stenosis. Pulmonic Valve: The pulmonic valve was not well visualized. Pulmonic valve regurgitation is not visualized. Aorta: The aortic root and ascending aorta are structurally normal, with no evidence of dilitation. Venous: The inferior vena cava is dilated in size with less than 50% respiratory variability, suggesting right atrial pressure of 15 mmHg. IAS/Shunts: The interatrial septum was not well visualized.  LEFT VENTRICLE PLAX 2D LVIDd:         4.30 cm LVIDs:         3.00 cm LV PW:         1.68 cm LV IVS:        1.63 cm LVOT diam:     2.00 cm LVOT Area:     3.14 cm  LEFT ATRIUM         Index LA diam:    4.30 cm 3.06 cm/m   AORTA Ao Root diam: 2.70 cm Ao Asc diam:  2.90 cm TRICUSPID VALVE TR Peak grad:   41.5 mmHg TR Vmax:        322.00 cm/s  SHUNTS Systemic Diam: 2.00 cm Buford Dresser MD Electronically signed by Buford Dresser MD Signature Date/Time: 07/06/2022/4:46:47 PM  Final    DG Chest Port 1 View  Result Date: 07/20/2022 CLINICAL DATA:  Status post left thoracentesis. EXAM: PORTABLE CHEST 1 VIEW COMPARISON:  Same day. FINDINGS: No pneumothorax is noted status post left-sided thoracentesis. Left pleural effusion is significantly smaller. IMPRESSION: No pneumothorax status post left thoracentesis. Electronically Signed   By: Marijo Conception M.D.   On: 06/30/2022 16:49   DG Chest Port 1 View  Result Date: 07/06/2022 CLINICAL DATA:  Shortness of breath EXAM: PORTABLE CHEST 1 VIEW COMPARISON:  Previous studies including the examination done on 07/03/2022 FINDINGS: Transverse diameter of heart is increased. Central pulmonary vessels are prominent. Increased interstitial and alveolar markings are seen in parahilar regions and lower lung fields. Left hemidiaphragm is elevated. Small to moderate bilateral pleural effusions are seen, more so on the left  side. There is interval increase in amount of left pleural effusion. There is no pneumothorax. IMPRESSION: Cardiomegaly. CHF. Small to moderate bilateral pleural effusions, more so on the left side with interval increase. Electronically Signed   By: Elmer Picker M.D.   On: 07/28/2022 13:18   CT Angio Chest PE W and/or Wo Contrast  Result Date: 07/03/2022 CLINICAL DATA:  Stage IV lung cancer. Tachycardia the cancer center. Shortness of breath with chills and cough. Chest pain weakness. * Tracking Code: BO * EXAM: CT ANGIOGRAPHY CHEST WITH CONTRAST TECHNIQUE: Multidetector CT imaging of the chest was performed using the pulmonary embolus protocol during bolus administration of intravenous contrast. Multiplanar CT image reconstructions and MIPs were obtained to evaluate the vascular anatomy. RADIATION DOSE REDUCTION: This exam was performed according to the departmental dose-optimization program which includes automated exposure control, adjustment of the mA and/or kV according to patient size and/or use of iterative reconstruction technique. CONTRAST:  60mL OMNIPAQUE IOHEXOL 350 MG/ML SOLN COMPARISON:  04/24/2022 FINDINGS: Cardiovascular: No filling defect is identified in the pulmonary arterial tree to suggest pulmonary embolus. Dilute contrast medium is present in the thoracic aorta, without specific findings of acute aortic abnormality. Extensive coronary and aortic atherosclerosis and branch vessel atherosclerosis. Cardiomegaly is present. Mediastinum/Nodes: AP window lymph node 1.0 cm in short axis on image 58 series 4, previously 0.7 cm. Precarinal lymph node 1.2 cm in short axis, previously 0.8 cm. Other thoracic lymph nodes appear mildly abnormally enlarged and mildly increased from previous. Subcarinal node 2.2 cm in short axis on image 79 series 4, formerly 1.6 cm. Increased right infrahilar adenopathy. Lungs/Pleura: Small bilateral pleural effusions with loculated components on the left.  Increased rind like pleural thickening the left lung apex for example on image 46 of series 4, concerning for pleural malignancy. Likewise malignant pleural effusion cannot be excluded on the right, and there is some focal nodularity along the right lateral pleural margin. Progressive atelectasis in the left lower lobe company by the pleural effusion. Bilateral airway thickening. Secondary pulmonary lobular septal thickening at the lung apices favoring interstitial edema with patchy ground-glass densities in both lungs likewise a likely manifestation of edema. Increased atelectasis in the right lower lobe associated with the increasing right pleural effusion. Patchy confluent regions of airspace opacity in the right perihilar region, generally favoring edema. The prior faint ground-glass density nodule seen in the right upper lobe on the prior exam is completely obscured by surrounding edema and other ground-glass opacities and indistinct nodular opacities in the right lung. Upper Abdomen: Nonspecific hypodense right kidney upper pole lesions, probably cysts. The patient had a left renal mass along the lower pole on 08/14/2021, this  vertical level was not included on today's chest CT. Musculoskeletal: Stable inferior endplate compression at T12. Review of the MIP images confirms the above findings. IMPRESSION: 1. No filling defect is identified in the pulmonary arterial tree to suggest pulmonary embolus. 2. Small bilateral pleural effusions with loculated components on the left. The pleural effusion is substantially increased on the right side. Increased rind like pleural thickening at the left lung apex, concerning for pleural malignancy. Likewise, there is some focal nodularity along the right lateral pleural margin which could be from pleural metastatic disease. 3. Increased mediastinal and right infrahilar adenopathy, concerning for malignancy. 4. Cardiomegaly interstitial edema with patchy ground-glass  opacities in both lungs, favoring pulmonary edema. There is also some overt airspace opacity in the right lung likely from edema, less likely pneumonia. 5. The prior faint ground-glass density nodule in the right upper lobe is completely obscured by surrounding edema and other indistinct ground-glass opacities in the right lung. 6. The patient had an enhancing left renal lower pole mass on 08/14/2021, this vertical level was not included on today's chest CT. 7. Extensive coronary and aortic atherosclerosis. 8. Stable inferior endplate compression at T12. Aortic Atherosclerosis (ICD10-I70.0). Electronically Signed   By: Van Clines M.D.   On: 07/03/2022 15:22   DG Chest Portable 1 View  Result Date: 07/03/2022 CLINICAL DATA:  82 year old female with lung cancer. Tachycardia, shortness of breath, cough, chest pain, weakness. EXAM: PORTABLE CHEST 1 VIEW COMPARISON:  Restaging CT Chest, Abdomen, and Pelvis 04/24/2022, and earlier. FINDINGS: Portable AP semi upright view at 1027 hours. Moderate left and small right pleural effusions both seem mildly increased since the CT in September. Other mediastinal contours appear stable. No pneumothorax. No air bronchograms. Chronic increased interstitial markings appear stable. Calcified aortic atherosclerosis. Visualized tracheal air column is within normal limits. Stable visualized osseous structures. Paucity of bowel gas in the visible abdomen. IMPRESSION: 1. Chronic left > right pleural effusions seem mildly increased since September restaging CTs. 2. No other acute cardiopulmonary abnormality identified. Electronically Signed   By: Genevie Ann M.D.   On: 07/03/2022 11:08    Microbiology: Recent Results (from the past 240 hour(s))  Culture, blood (routine x 2)     Status: None   Collection Time: 07/03/22 11:58 AM   Specimen: BLOOD RIGHT HAND  Result Value Ref Range Status   Specimen Description   Final    BLOOD RIGHT HAND Performed at [facility] [address] [place], [place] [address]    Special Requests   Final    BOTTLES DRAWN AEROBIC AND ANAEROBIC Blood Culture results may not be optimal due to an inadequate volume of blood received in culture bottles Performed at [facility] [address] [place], [place] [address]    Culture   Final    NO GROWTH 5 DAYS Performed at [facility], [place] [address] [place], [place] [address]    Report Status 07/08/2022 FINAL  Final  Culture, blood (routine x 2)     Status: None   Collection Time: 07/03/22 12:03 PM   Specimen: BLOOD  Result Value Ref Range Status   Specimen Description   Final    BLOOD LEFT ANTECUBITAL Performed at [facility] [address] [place], [place] [address]    Special Requests   Final    BOTTLES DRAWN AEROBIC AND ANAEROBIC Blood Culture results may not be optimal due to an inadequate volume of blood received in culture  bottles Performed at [facility] [address], [place], [place] [address]    Culture   Final    NO GROWTH 5 DAYS Performed at [facility], [place] [address], [place], [place] [address]    Report Status 07/08/2022 FINAL  Final  Resp Panel by RT-PCR (Flu A&B, Covid) Anterior Nasal Swab     Status: None   Collection Time: 07/03/22 12:23 PM   Specimen: Anterior Nasal Swab  Result Value Ref Range Status   SARS Coronavirus 2 by RT PCR NEGATIVE NEGATIVE Final    Comment: (NOTE) SARS-CoV-2 target nucleic acids are NOT DETECTED.  The SARS-CoV-2 RNA is generally detectable in upper respiratory specimens during the acute phase of infection. The lowest concentration of SARS-CoV-2 viral copies this assay can detect is 138 copies/mL. A negative result does not preclude SARS-Cov-2 infection and should not be used as the sole basis for treatment or other patient management decisions. A negative result may occur with  improper specimen collection/handling, submission  of specimen other than nasopharyngeal swab, presence of viral mutation(s) within the areas targeted by this assay, and inadequate number of viral copies(<138 copies/mL). A negative result must be combined with clinical observations, patient history, and epidemiological information. The expected result is Negative.  Fact Sheet for Patients:  EntrepreneurPulse.com.au  Fact Sheet for Healthcare Providers:  IncredibleEmployment.be  This test is no t yet approved or cleared by the [place] FDA and  has been authorized for detection and/or diagnosis of SARS-CoV-2 by FDA under an Emergency Use Authorization (EUA). This EUA will remain  in effect (meaning this test can be used) for the duration of the COVID-19 declaration under Section 564(b)(1) of the Act, 21 U.S.C.section 360bbb-3(b)(1), unless the authorization is terminated  or revoked sooner.       Influenza A by PCR NEGATIVE NEGATIVE Final   Influenza B by PCR NEGATIVE NEGATIVE Final    Comment: (NOTE) The Xpert Xpress SARS-CoV-2/FLU/RSV plus assay is intended as an aid in the diagnosis of influenza from Nasopharyngeal swab specimens and should not be used as a sole basis for treatment. Nasal washings and aspirates are unacceptable for Xpert Xpress SARS-CoV-2/FLU/RSV testing.  Fact Sheet for Patients: EntrepreneurPulse.com.au  Fact Sheet for Healthcare Providers: IncredibleEmployment.be  This test is not yet approved or cleared by the [place] FDA and has been authorized for detection and/or diagnosis of SARS-CoV-2 by FDA under an Emergency Use Authorization (EUA). This EUA will remain in effect (meaning this test can be used) for the duration of the COVID-19 declaration under Section 564(b)(1) of the Act, 21 U.S.C. section 360bbb-3(b)(1), unless the authorization is terminated or revoked.  Performed at [facility], [place]  [address], [place], [place] [address]   Pleural fluid culture w Gram Stain     Status: None   Collection Time: 07/16/2022  4:12 PM   Specimen: Pleural Fluid  Result Value Ref Range Status   Specimen Description   Final    PLEURAL Performed at [facility] [address], [place], [place] [address]    Special Requests   Final    NONE Performed at [facility], [place] [address] [place], [place] [address]    Gram Stain   Final    FEW WBC PRESENT,BOTH PMN AND MONONUCLEAR NO ORGANISMS SEEN    Culture   Final    NO GROWTH Performed at [facility], [place] [address], [place], [place] [address]  Report Status 07-31-2022 FINAL  Final  Respiratory (~20 pathogens) panel by PCR     Status: None   Collection Time: 07/28/2022  4:53 PM   Specimen: Nasopharyngeal Swab; Respiratory  Result Value Ref Range Status   Adenovirus NOT DETECTED NOT DETECTED Final   Coronavirus 229E NOT DETECTED NOT DETECTED Final    Comment: (NOTE) The Coronavirus on the Respiratory Panel, DOES NOT test for the novel  Coronavirus (2019 nCoV)    Coronavirus HKU1 NOT DETECTED NOT DETECTED Final   Coronavirus NL63 NOT DETECTED NOT DETECTED Final   Coronavirus OC43 NOT DETECTED NOT DETECTED Final   Metapneumovirus NOT DETECTED NOT DETECTED Final   Rhinovirus / Enterovirus NOT DETECTED NOT DETECTED Final   Influenza A NOT DETECTED NOT DETECTED Final   Influenza B NOT DETECTED NOT DETECTED Final   Parainfluenza Virus 1 NOT DETECTED NOT DETECTED Final   Parainfluenza Virus 2 NOT DETECTED NOT DETECTED Final   Parainfluenza Virus 3 NOT DETECTED NOT DETECTED Final   Parainfluenza Virus 4 NOT DETECTED NOT DETECTED Final   Respiratory Syncytial Virus NOT DETECTED NOT DETECTED Final   Bordetella pertussis NOT DETECTED NOT DETECTED Final   Bordetella Parapertussis NOT DETECTED NOT DETECTED Final   Chlamydophila pneumoniae NOT DETECTED NOT DETECTED Final   Mycoplasma pneumoniae NOT  DETECTED NOT DETECTED Final    Comment: Performed at [facility], [address], [place], [place] [address]  MRSA Next Gen by PCR, Nasal     Status: None   Collection Time: 07/07/22 12:44 PM   Specimen: Nasal Mucosa; Nasal Swab  Result Value Ref Range Status   MRSA by PCR Next Gen NOT DETECTED NOT DETECTED Final    Comment: (NOTE) The GeneXpert MRSA Assay (FDA approved for NASAL specimens only), is one component of a comprehensive MRSA colonization surveillance program. It is not intended to diagnose MRSA infection nor to guide or monitor treatment for MRSA infections. Test performance is not FDA approved in patients less than 1 years old. Performed at [facility], [place] [address] [place], [place] [address]     Time spent: 20 minutes  Signed: Consandra Laske Arsenio Loader, MD 2022/07/31

## 2022-07-30 DEATH — deceased

## 2022-08-06 ENCOUNTER — Other Ambulatory Visit: Payer: Medicare Other

## 2022-08-06 ENCOUNTER — Ambulatory Visit: Payer: Medicare Other | Admitting: Internal Medicine

## 2023-06-02 IMAGING — CR DG CHEST 2V
2 series · 2 of 2 positions shown · non-contrast
Comparison: CT chest 08/14/2021

CLINICAL DATA: PleurX catheter placement. Recurrent pleural
effusions.

EXAM:
CHEST - 2 VIEW

[w chest pa]
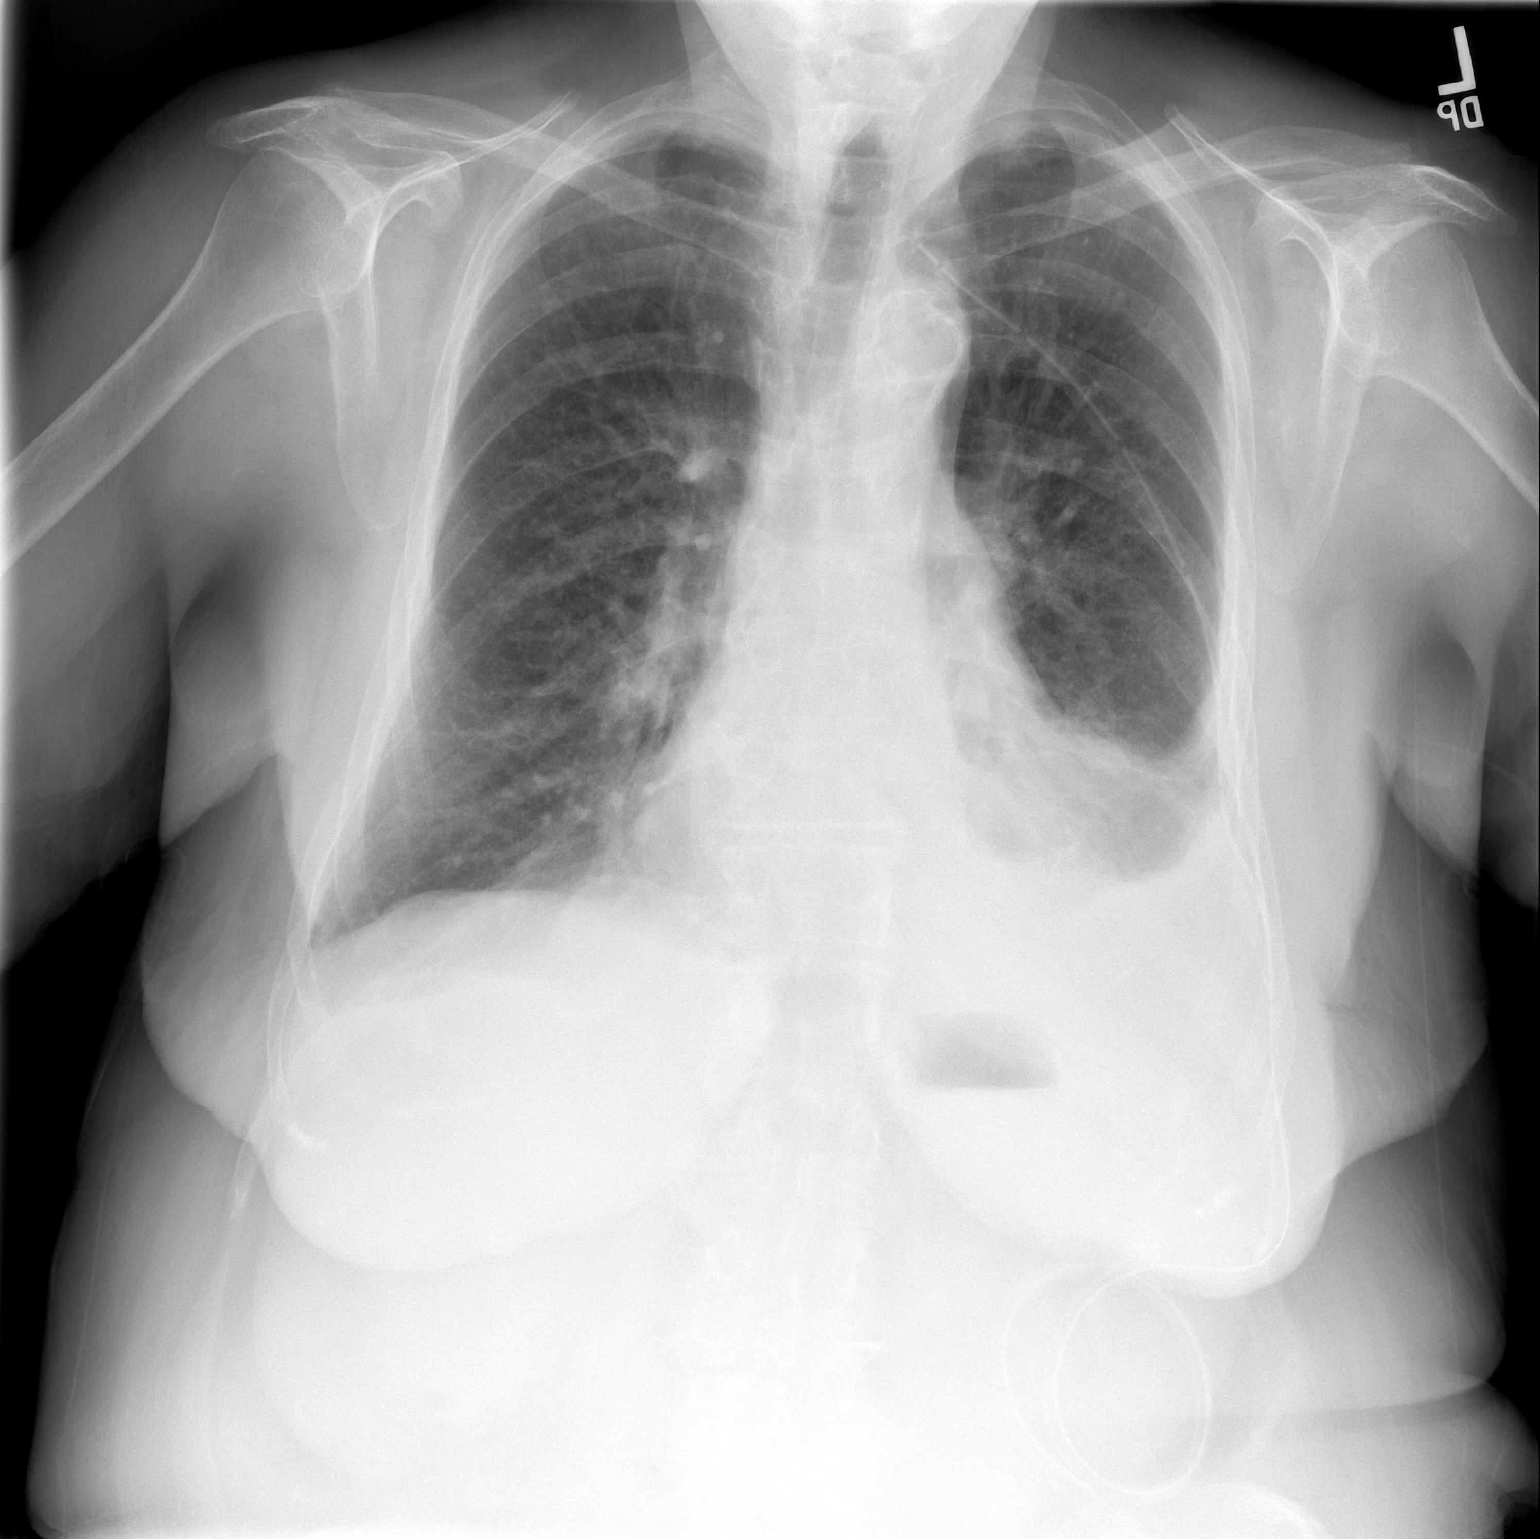

[w chest lat]
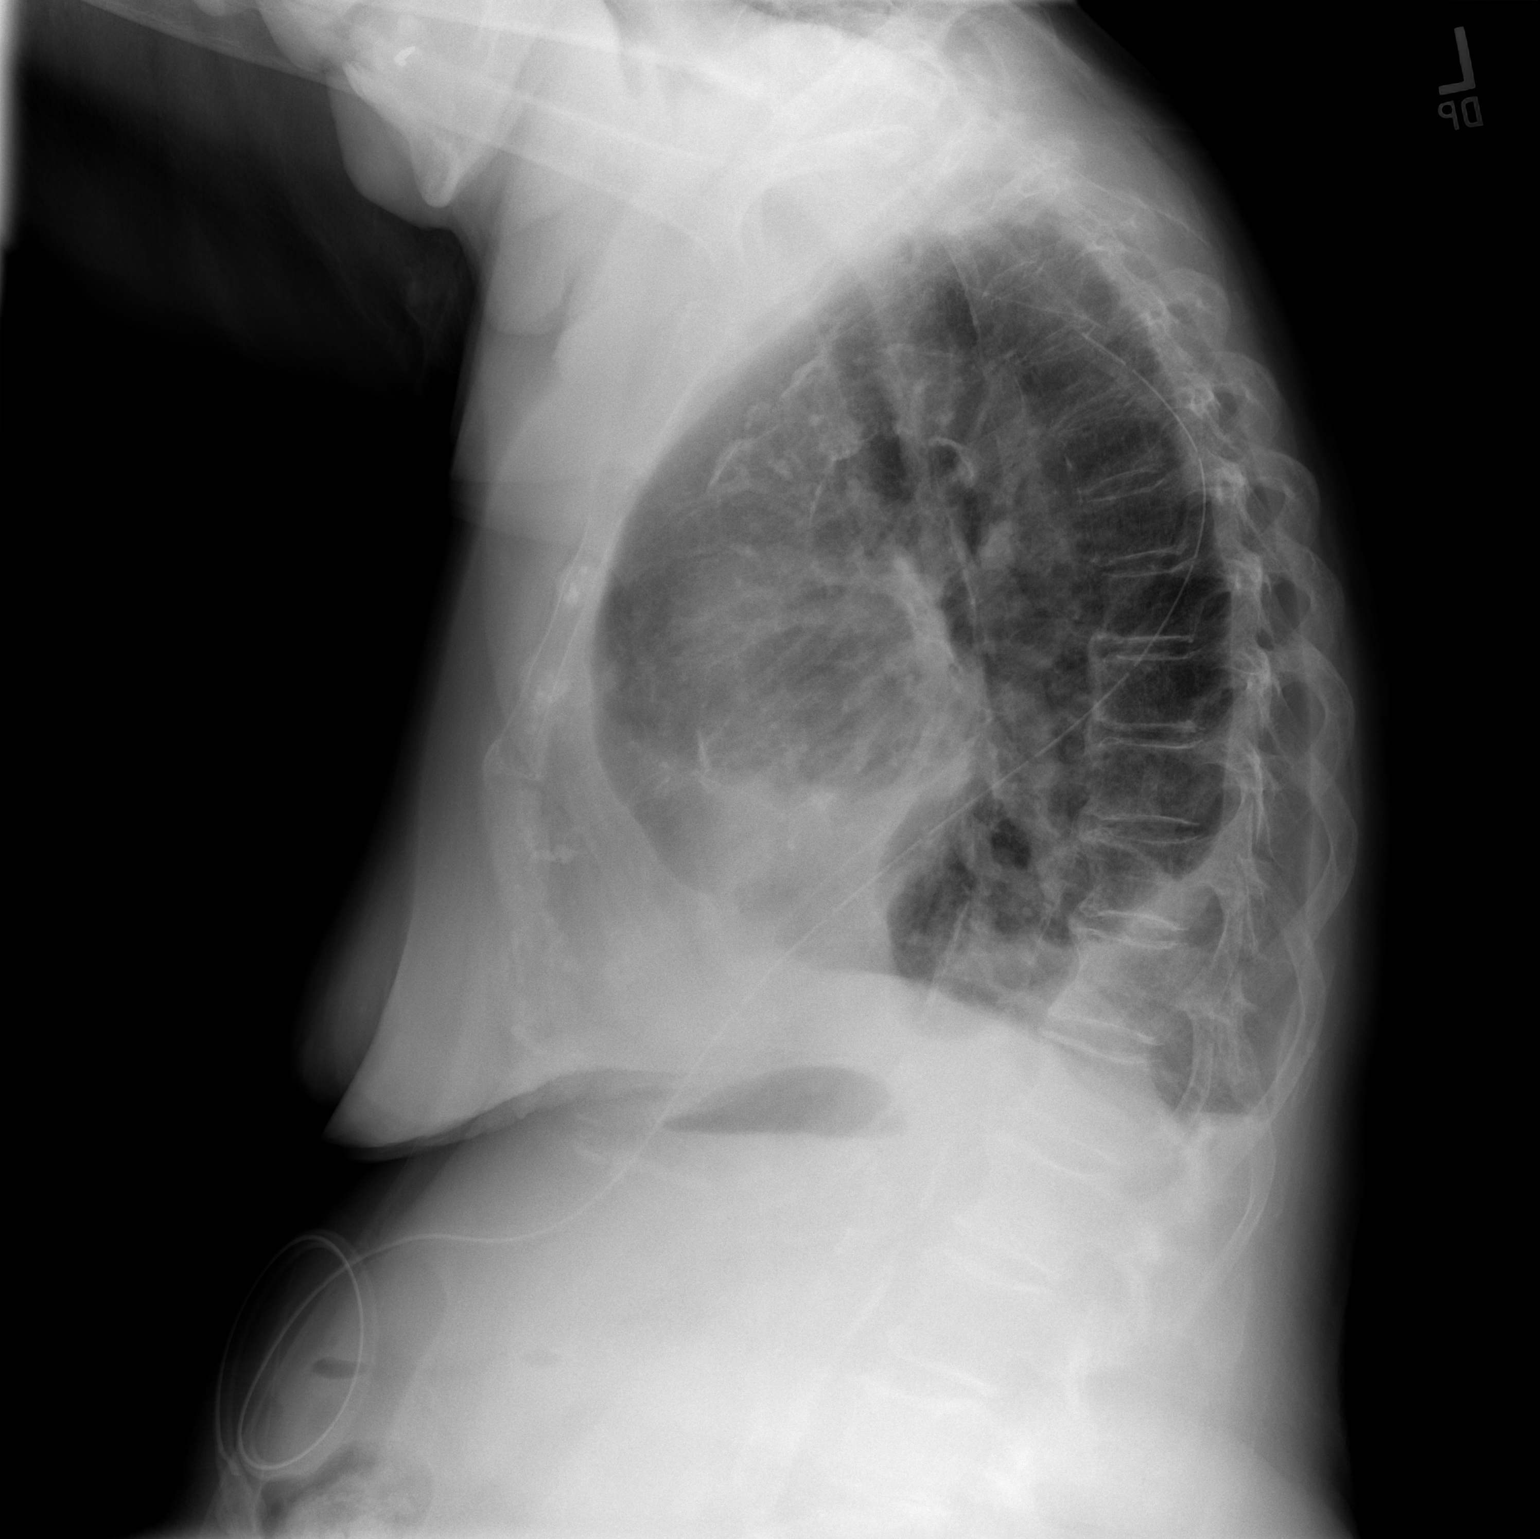

[2 of 2 positions shown; findings below may reference images not displayed]

FINDINGS: Heart size is upper limits of normal. Atherosclerotic calcifications
are present at the aortic arch. The pleural catheter is stable in
position. Moderate left pleural effusion scratched at a chronic left
pleural effusion persists, stable to slightly decreased from prior
exam. More superior loculated fluid in fluid in the minor fissure is
no longer visualized. Associated dependent airspace disease is
present. The right lung is clear.
IMPRESSION: 1. Interval reduction in loculated left pleural effusion.
2. Left pleural catheter in place.
